# Patient Record
Sex: Male | Born: 1950 | Race: Black or African American | Hispanic: No | Smoking: Current every day smoker
Health system: Southern US, Community
[De-identification: ages and names within clinical notes are randomized; demographics above are authoritative.]

## PROBLEM LIST (undated history)

## (undated) DIAGNOSIS — M1990 Unspecified osteoarthritis, unspecified site: Secondary | ICD-10-CM

## (undated) DIAGNOSIS — G4730 Sleep apnea, unspecified: Secondary | ICD-10-CM

## (undated) DIAGNOSIS — C189 Malignant neoplasm of colon, unspecified: Secondary | ICD-10-CM

## (undated) DIAGNOSIS — K219 Gastro-esophageal reflux disease without esophagitis: Secondary | ICD-10-CM

## (undated) DIAGNOSIS — K635 Polyp of colon: Secondary | ICD-10-CM

## (undated) DIAGNOSIS — E119 Type 2 diabetes mellitus without complications: Secondary | ICD-10-CM

## (undated) DIAGNOSIS — Z9889 Other specified postprocedural states: Secondary | ICD-10-CM

## (undated) DIAGNOSIS — Z973 Presence of spectacles and contact lenses: Secondary | ICD-10-CM

## (undated) DIAGNOSIS — I10 Essential (primary) hypertension: Secondary | ICD-10-CM

## (undated) DIAGNOSIS — R011 Cardiac murmur, unspecified: Secondary | ICD-10-CM

## (undated) DIAGNOSIS — E785 Hyperlipidemia, unspecified: Secondary | ICD-10-CM

## (undated) DIAGNOSIS — N189 Chronic kidney disease, unspecified: Secondary | ICD-10-CM

## (undated) DIAGNOSIS — D649 Anemia, unspecified: Secondary | ICD-10-CM

## (undated) HISTORY — PX: COLOSTOMY REVERSAL: SHX5782

## (undated) HISTORY — DX: Hyperlipidemia, unspecified: E78.5

## (undated) HISTORY — PX: GANGLION CYST EXCISION: SHX1691

## (undated) HISTORY — DX: Cardiac murmur, unspecified: R01.1

## (undated) HISTORY — PX: COLONOSCOPY: SHX174

## (undated) HISTORY — DX: Malignant neoplasm of colon, unspecified: C18.9

## (undated) HISTORY — DX: Gastro-esophageal reflux disease without esophagitis: K21.9

## (undated) HISTORY — DX: Other specified postprocedural states: Z98.890

## (undated) HISTORY — DX: Sleep apnea, unspecified: G47.30

## (undated) HISTORY — PX: OTHER SURGICAL HISTORY: SHX169

## (undated) HISTORY — PX: RESECTION SMALL BOWEL / CLOSURE ILEOSTOMY: SUR1248

## (undated) HISTORY — DX: Anemia, unspecified: D64.9

## (undated) HISTORY — DX: Essential (primary) hypertension: I10

---

## 1998-10-05 ENCOUNTER — Ambulatory Visit (HOSPITAL_COMMUNITY): Admission: RE | Admit: 1998-10-05 | Discharge: 1998-10-05 | Payer: Self-pay | Admitting: *Deleted

## 2001-03-27 ENCOUNTER — Ambulatory Visit (HOSPITAL_BASED_OUTPATIENT_CLINIC_OR_DEPARTMENT_OTHER): Admission: RE | Admit: 2001-03-27 | Discharge: 2001-03-27 | Payer: Self-pay | Admitting: Otolaryngology

## 2002-04-06 ENCOUNTER — Encounter: Admission: RE | Admit: 2002-04-06 | Discharge: 2002-04-06 | Payer: Self-pay | Admitting: Internal Medicine

## 2002-05-14 ENCOUNTER — Encounter: Admission: RE | Admit: 2002-05-14 | Discharge: 2002-05-14 | Payer: Self-pay | Admitting: Internal Medicine

## 2002-08-23 ENCOUNTER — Encounter: Admission: RE | Admit: 2002-08-23 | Discharge: 2002-08-23 | Payer: Self-pay | Admitting: Internal Medicine

## 2002-10-17 ENCOUNTER — Encounter (INDEPENDENT_AMBULATORY_CARE_PROVIDER_SITE_OTHER): Payer: Self-pay | Admitting: *Deleted

## 2002-10-27 ENCOUNTER — Encounter: Admission: RE | Admit: 2002-10-27 | Discharge: 2002-10-27 | Payer: Self-pay | Admitting: Internal Medicine

## 2002-11-17 ENCOUNTER — Encounter: Admission: RE | Admit: 2002-11-17 | Discharge: 2002-11-17 | Payer: Self-pay | Admitting: Internal Medicine

## 2002-12-29 ENCOUNTER — Encounter: Admission: RE | Admit: 2002-12-29 | Discharge: 2002-12-29 | Payer: Self-pay | Admitting: Internal Medicine

## 2003-05-09 ENCOUNTER — Encounter: Admission: RE | Admit: 2003-05-09 | Discharge: 2003-05-09 | Payer: Self-pay | Admitting: Internal Medicine

## 2003-12-23 ENCOUNTER — Encounter: Admission: RE | Admit: 2003-12-23 | Discharge: 2003-12-23 | Payer: Self-pay | Admitting: Internal Medicine

## 2004-01-23 ENCOUNTER — Encounter: Admission: RE | Admit: 2004-01-23 | Discharge: 2004-01-23 | Payer: Self-pay | Admitting: Internal Medicine

## 2004-05-22 ENCOUNTER — Ambulatory Visit: Payer: Self-pay | Admitting: Internal Medicine

## 2004-06-08 ENCOUNTER — Ambulatory Visit: Payer: Self-pay | Admitting: Internal Medicine

## 2004-06-08 ENCOUNTER — Encounter (INDEPENDENT_AMBULATORY_CARE_PROVIDER_SITE_OTHER): Payer: Self-pay | Admitting: *Deleted

## 2004-09-21 ENCOUNTER — Inpatient Hospital Stay (HOSPITAL_COMMUNITY): Admission: EM | Admit: 2004-09-21 | Discharge: 2004-09-22 | Payer: Self-pay | Admitting: Emergency Medicine

## 2004-09-21 ENCOUNTER — Ambulatory Visit: Payer: Self-pay | Admitting: Internal Medicine

## 2004-09-21 IMAGING — CR DG CHEST 1V PORT
1 series · 1 of 1 positions shown · non-contrast
Comparison: none

CLINICAL DATA: 53 year old male; chest pain and pressure, shortness of breath.
 PORTABLE SINGLE VIEW CHEST RADIOGRAPH ? [DATE]: 
 No comparisons.

[view not recorded]
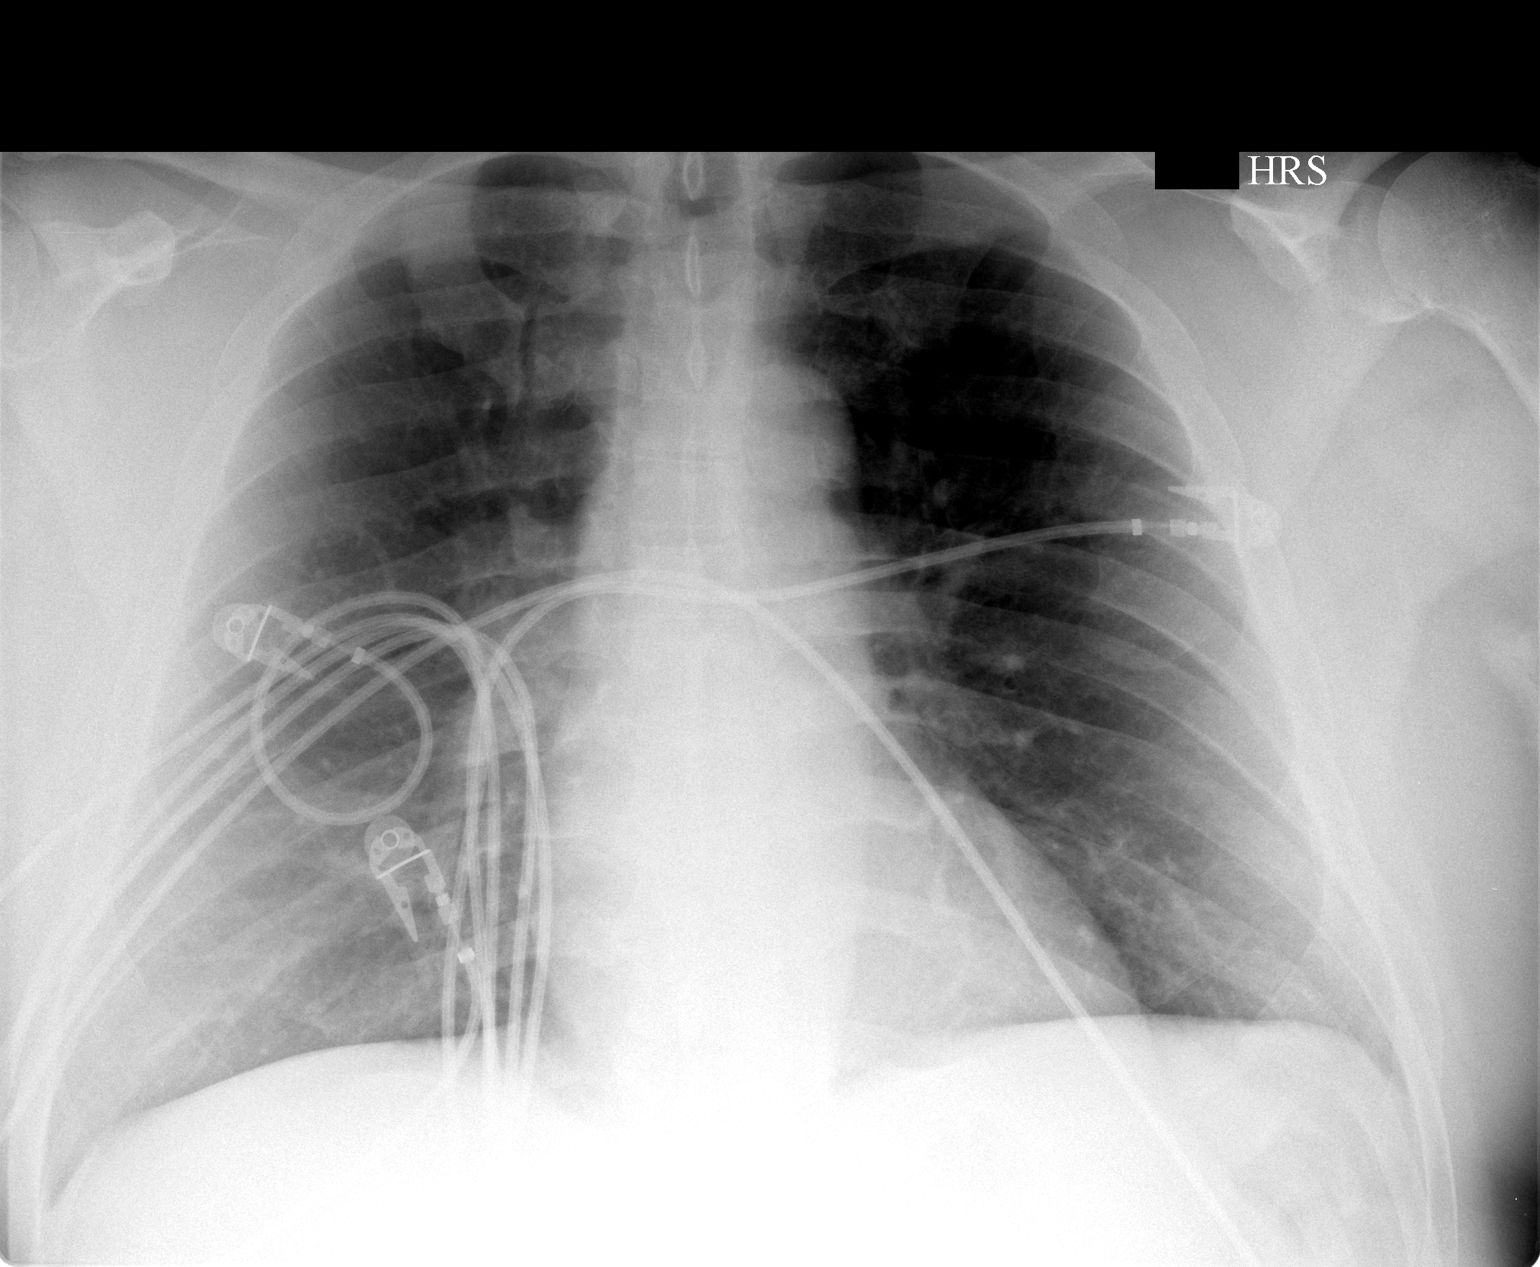

[1 of 1 positions shown; findings below may reference images not displayed]

FINDINGS: Monitor leads overlie the chest.  Normal heart size.  No congestive heart failure, pneumonia, effusion, or pneumothorax.
IMPRESSION: No acute finding.

## 2004-09-21 IMAGING — CR DG CHEST 2V
2 series · 2 of 2 positions shown · non-contrast
Comparison: none

CLINICAL DATA: Chest pain. 
 DIAGNOSTIC CHEST ? 2 VIEWS ? [DATE]:

[view not recorded (1 of 2)]
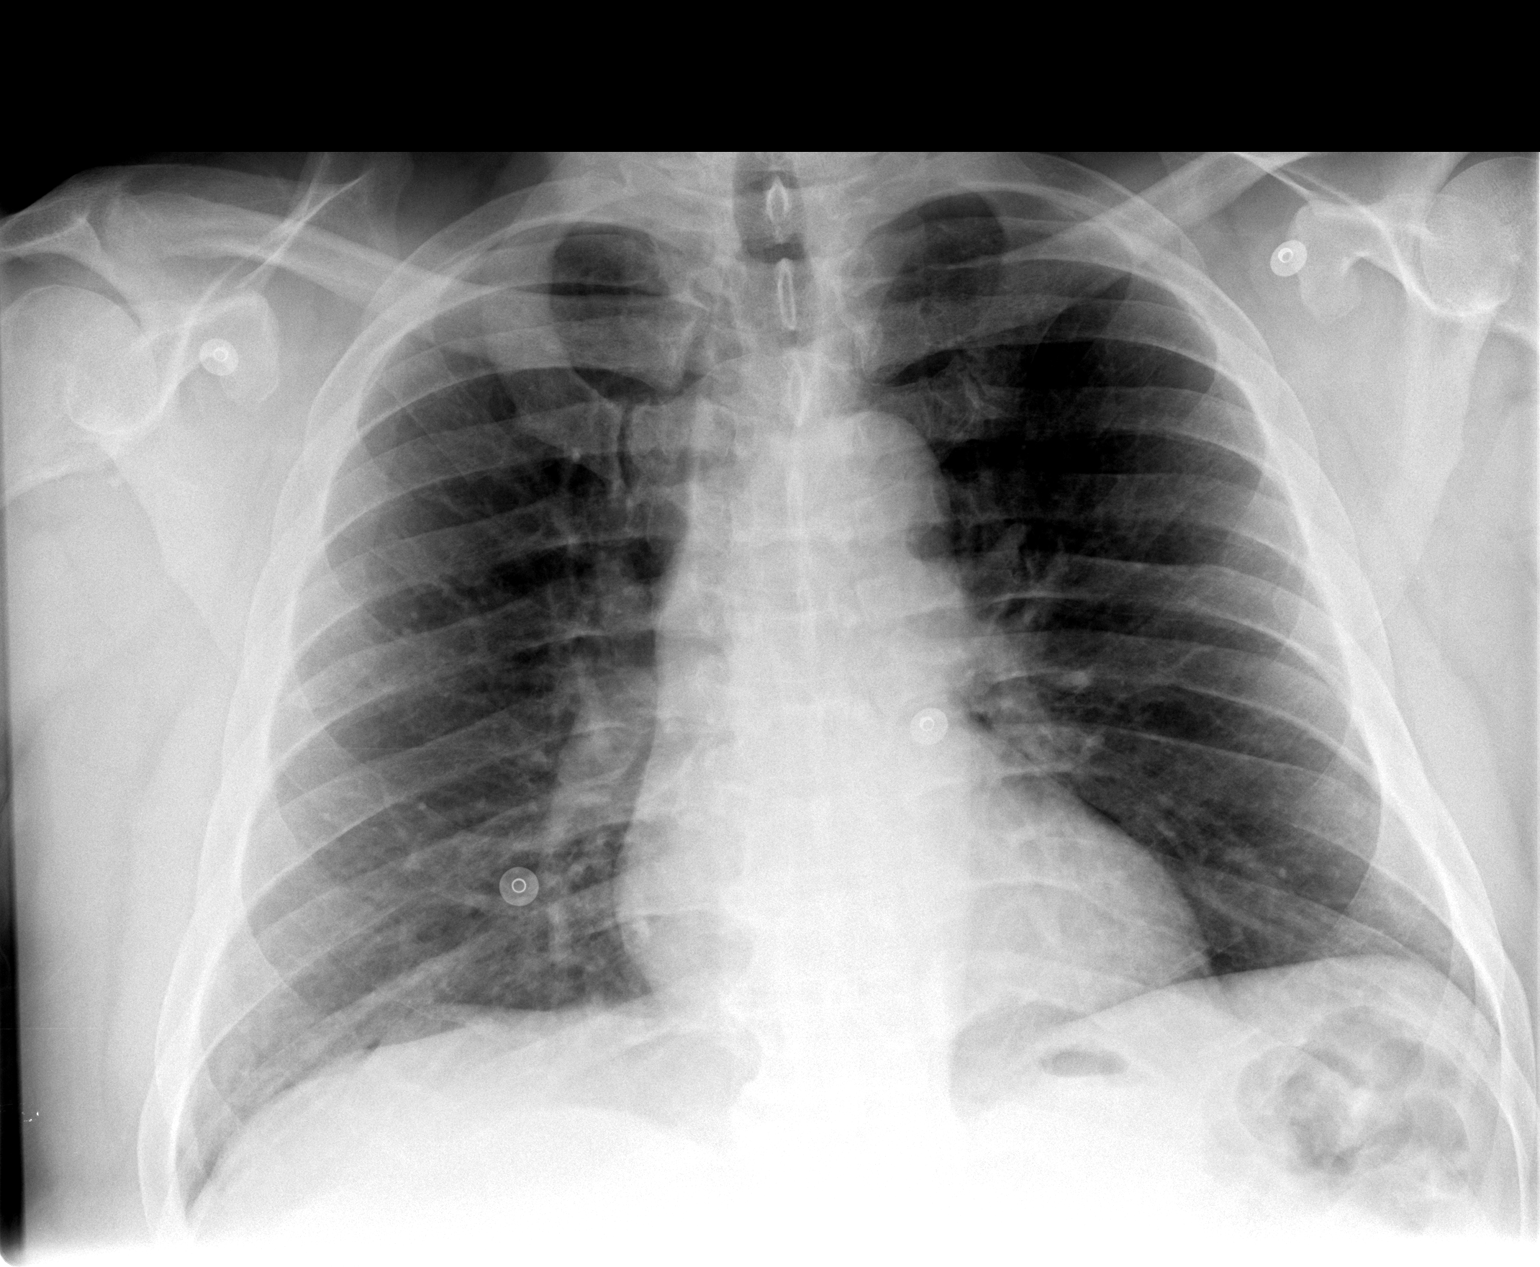

[view not recorded (2 of 2)]
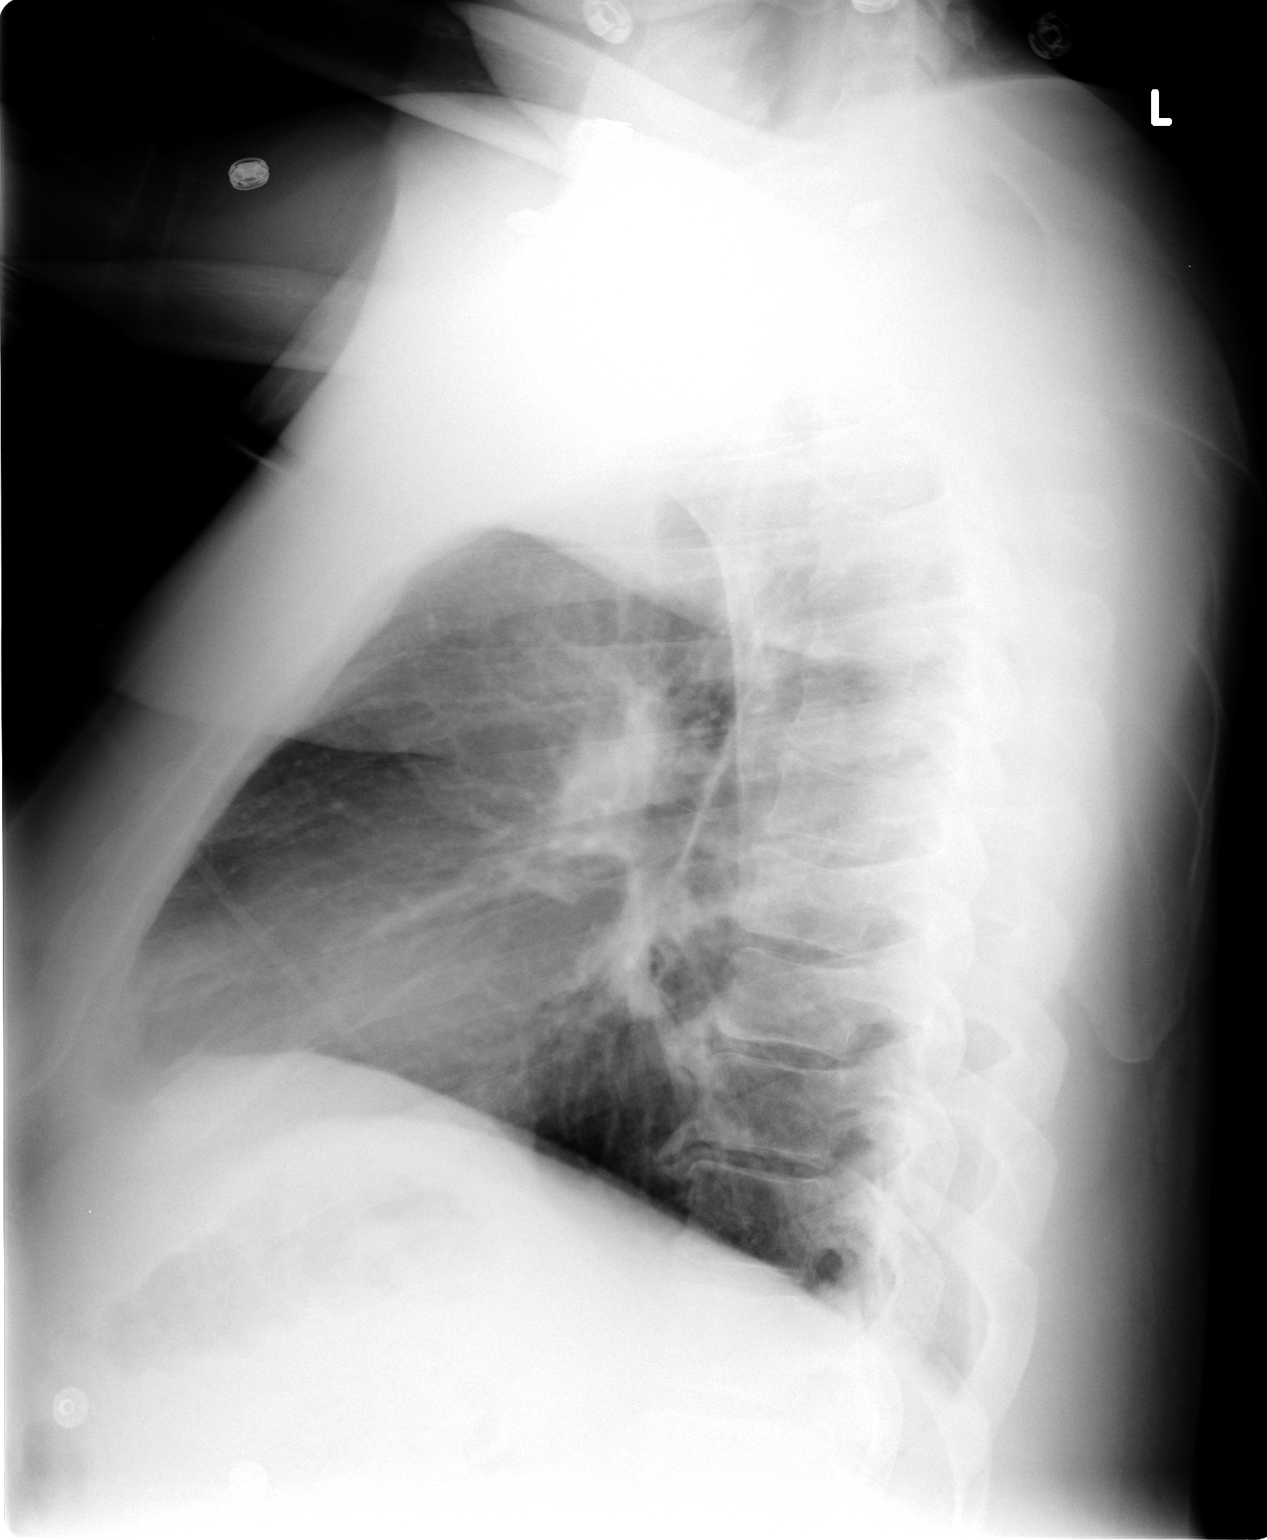

[2 of 2 positions shown; findings below may reference images not displayed]

FINDINGS: The heart size and mediastinal contours are normal.  The lungs are clear.  The visualized skeleton is unremarkable.   There is no interval change since [HOSPITAL] portable chest x-ray of [DATE].
IMPRESSION: No active disease.

## 2005-07-01 ENCOUNTER — Ambulatory Visit: Payer: Self-pay | Admitting: Internal Medicine

## 2005-07-31 ENCOUNTER — Emergency Department (HOSPITAL_COMMUNITY): Admission: EM | Admit: 2005-07-31 | Discharge: 2005-07-31 | Payer: Self-pay | Admitting: Family Medicine

## 2005-08-23 ENCOUNTER — Encounter (INDEPENDENT_AMBULATORY_CARE_PROVIDER_SITE_OTHER): Payer: Self-pay | Admitting: *Deleted

## 2005-08-23 ENCOUNTER — Ambulatory Visit: Payer: Self-pay | Admitting: Internal Medicine

## 2005-09-10 ENCOUNTER — Ambulatory Visit: Payer: Self-pay | Admitting: Hospitalist

## 2006-01-03 ENCOUNTER — Ambulatory Visit (HOSPITAL_COMMUNITY): Admission: RE | Admit: 2006-01-03 | Discharge: 2006-01-03 | Payer: Self-pay | Admitting: Internal Medicine

## 2006-01-03 ENCOUNTER — Ambulatory Visit: Payer: Self-pay | Admitting: Internal Medicine

## 2006-04-14 DIAGNOSIS — D539 Nutritional anemia, unspecified: Secondary | ICD-10-CM | POA: Insufficient documentation

## 2006-04-14 DIAGNOSIS — I10 Essential (primary) hypertension: Secondary | ICD-10-CM | POA: Insufficient documentation

## 2006-04-14 DIAGNOSIS — E785 Hyperlipidemia, unspecified: Secondary | ICD-10-CM | POA: Insufficient documentation

## 2006-04-14 DIAGNOSIS — E119 Type 2 diabetes mellitus without complications: Secondary | ICD-10-CM | POA: Insufficient documentation

## 2006-04-14 DIAGNOSIS — F17200 Nicotine dependence, unspecified, uncomplicated: Secondary | ICD-10-CM | POA: Insufficient documentation

## 2006-04-14 DIAGNOSIS — G4733 Obstructive sleep apnea (adult) (pediatric): Secondary | ICD-10-CM | POA: Insufficient documentation

## 2006-06-30 ENCOUNTER — Telehealth: Payer: Self-pay | Admitting: *Deleted

## 2007-04-14 ENCOUNTER — Ambulatory Visit: Payer: Self-pay | Admitting: Internal Medicine

## 2007-04-14 ENCOUNTER — Encounter (INDEPENDENT_AMBULATORY_CARE_PROVIDER_SITE_OTHER): Payer: Self-pay | Admitting: *Deleted

## 2007-04-14 LAB — CONVERTED CEMR LAB
ALT: 28 units/L (ref 0–53)
AST: 37 units/L (ref 0–37)
Albumin: 4.2 g/dL (ref 3.5–5.2)
Alkaline Phosphatase: 52 units/L (ref 39–117)
BUN: 13 mg/dL (ref 6–23)
Basophils Absolute: 0 10*3/uL (ref 0.0–0.1)
Basophils Relative: 0 % (ref 0–1)
Bilirubin Urine: NEGATIVE
CO2: 25 meq/L (ref 19–32)
Calcium: 9.5 mg/dL (ref 8.4–10.5)
Chloride: 102 meq/L (ref 96–112)
Cholesterol: 153 mg/dL (ref 0–200)
Creatinine, Ser: 1.15 mg/dL (ref 0.40–1.50)
Creatinine, Urine: 240.9 mg/dL
Eosinophils Absolute: 0.2 10*3/uL (ref 0.2–0.7)
Eosinophils Relative: 3 % (ref 0–5)
Ferritin: 55 ng/mL (ref 22–322)
Glucose, Bld: 283 mg/dL — ABNORMAL HIGH (ref 70–99)
HCT: 43.3 % (ref 39.0–52.0)
HDL: 44 mg/dL (ref >39)
Hemoglobin, Urine: NEGATIVE
Hemoglobin: 15.2 g/dL (ref 13.0–17.0)
Ketones, ur: NEGATIVE mg/dL
LDL Cholesterol: 85 mg/dL (ref 0–99)
Leukocytes, UA: NEGATIVE
Lymphocytes Relative: 46 % (ref 12–46)
Lymphs Abs: 3.4 10*3/uL (ref 0.7–4.0)
MCHC: 35.1 g/dL (ref 30.0–36.0)
MCV: 72 fL — ABNORMAL LOW (ref 78.0–100.0)
Microalb Creat Ratio: 15.9 mg/g (ref 0.0–30.0)
Microalb, Ur: 3.84 mg/dL — ABNORMAL HIGH (ref 0.00–1.89)
Monocytes Absolute: 0.5 10*3/uL (ref 0.1–1.0)
Monocytes Relative: 6 % (ref 3–12)
Neutro Abs: 3.4 10*3/uL (ref 1.7–7.7)
Neutrophils Relative %: 46 % (ref 43–77)
Nitrite: NEGATIVE
Platelets: 269 10*3/uL (ref 150–400)
Potassium: 5 meq/L (ref 3.5–5.3)
Protein, ur: NEGATIVE mg/dL
RBC: 6.01 M/uL — ABNORMAL HIGH (ref 4.22–5.81)
RDW: 15.7 % — ABNORMAL HIGH (ref 11.5–15.5)
Sodium: 138 meq/L (ref 135–145)
Specific Gravity, Urine: 1.028 (ref 1.005–1.03)
TSH: 2.186 microintl units/mL (ref 0.350–5.50)
Total Bilirubin: 0.7 mg/dL (ref 0.3–1.2)
Total CHOL/HDL Ratio: 3.5
Total Protein: 7.5 g/dL (ref 6.0–8.3)
Triglycerides: 120 mg/dL (ref <150)
Urine Glucose: 100 mg/dL — AB
Urobilinogen, UA: 0.2 (ref 0.0–1.0)
VLDL: 24 mg/dL (ref 0–40)
WBC: 7.5 10*3/uL (ref 4.0–10.5)
pH: 6 (ref 5.0–8.0)

## 2007-04-22 ENCOUNTER — Telehealth (INDEPENDENT_AMBULATORY_CARE_PROVIDER_SITE_OTHER): Payer: Self-pay | Admitting: *Deleted

## 2007-05-06 ENCOUNTER — Telehealth (INDEPENDENT_AMBULATORY_CARE_PROVIDER_SITE_OTHER): Payer: Self-pay | Admitting: *Deleted

## 2007-05-06 ENCOUNTER — Telehealth: Payer: Self-pay | Admitting: *Deleted

## 2007-05-12 ENCOUNTER — Ambulatory Visit: Payer: Self-pay | Admitting: Gastroenterology

## 2007-05-12 ENCOUNTER — Encounter (INDEPENDENT_AMBULATORY_CARE_PROVIDER_SITE_OTHER): Payer: Self-pay | Admitting: *Deleted

## 2007-06-09 ENCOUNTER — Encounter: Payer: Self-pay | Admitting: Gastroenterology

## 2007-06-09 ENCOUNTER — Ambulatory Visit: Payer: Self-pay | Admitting: Gastroenterology

## 2007-06-12 ENCOUNTER — Encounter: Payer: Self-pay | Admitting: Gastroenterology

## 2007-06-12 ENCOUNTER — Ambulatory Visit: Payer: Self-pay | Admitting: Gastroenterology

## 2007-06-12 ENCOUNTER — Encounter (INDEPENDENT_AMBULATORY_CARE_PROVIDER_SITE_OTHER): Payer: Self-pay | Admitting: *Deleted

## 2007-06-16 ENCOUNTER — Ambulatory Visit: Payer: Self-pay | Admitting: Infectious Disease

## 2007-06-16 ENCOUNTER — Encounter (INDEPENDENT_AMBULATORY_CARE_PROVIDER_SITE_OTHER): Payer: Self-pay | Admitting: *Deleted

## 2007-06-16 LAB — CONVERTED CEMR LAB
ALT: 25 units/L (ref 0–53)
AST: 23 units/L (ref 0–37)
Albumin: 4.3 g/dL (ref 3.5–5.2)
Alkaline Phosphatase: 58 units/L (ref 39–117)
BUN: 11 mg/dL (ref 6–23)
CO2: 25 meq/L (ref 19–32)
Calcium: 9.8 mg/dL (ref 8.4–10.5)
Chloride: 103 meq/L (ref 96–112)
Creatinine, Ser: 1.14 mg/dL (ref 0.40–1.50)
Glucose, Bld: 330 mg/dL — ABNORMAL HIGH (ref 70–99)
HCT: 42.4 % (ref 39.0–52.0)
Hemoglobin: 15.2 g/dL (ref 13.0–17.0)
Hgb A1c MFr Bld: 13.7 %
MCHC: 35.8 g/dL (ref 30.0–36.0)
MCV: 71.1 fL — ABNORMAL LOW (ref 78.0–100.0)
Platelets: 286 10*3/uL (ref 150–400)
Potassium: 4.6 meq/L (ref 3.5–5.3)
RBC: 5.96 M/uL — ABNORMAL HIGH (ref 4.22–5.81)
RDW: 15.2 % (ref 11.5–15.5)
Sodium: 138 meq/L (ref 135–145)
Total Bilirubin: 0.9 mg/dL (ref 0.3–1.2)
Total Protein: 7.5 g/dL (ref 6.0–8.3)
WBC: 9.7 10*3/uL (ref 4.0–10.5)

## 2007-07-17 ENCOUNTER — Telehealth (INDEPENDENT_AMBULATORY_CARE_PROVIDER_SITE_OTHER): Payer: Self-pay | Admitting: *Deleted

## 2007-07-17 ENCOUNTER — Ambulatory Visit (HOSPITAL_BASED_OUTPATIENT_CLINIC_OR_DEPARTMENT_OTHER): Admission: RE | Admit: 2007-07-17 | Discharge: 2007-07-17 | Payer: Self-pay | Admitting: *Deleted

## 2007-07-25 ENCOUNTER — Ambulatory Visit: Payer: Self-pay | Admitting: Internal Medicine

## 2007-08-12 ENCOUNTER — Telehealth (INDEPENDENT_AMBULATORY_CARE_PROVIDER_SITE_OTHER): Payer: Self-pay | Admitting: *Deleted

## 2007-11-14 ENCOUNTER — Emergency Department (HOSPITAL_COMMUNITY): Admission: EM | Admit: 2007-11-14 | Discharge: 2007-11-14 | Payer: Self-pay | Admitting: Emergency Medicine

## 2007-11-14 IMAGING — CR DG HIP (WITH OR WITHOUT PELVIS) 2-3V*L*
2 series · 2 of 2 positions shown · non-contrast
Comparison: None

CLINICAL DATA: Fall

LEFT HIP - COMPLETE 2+ VIEW

[t hip ap left]
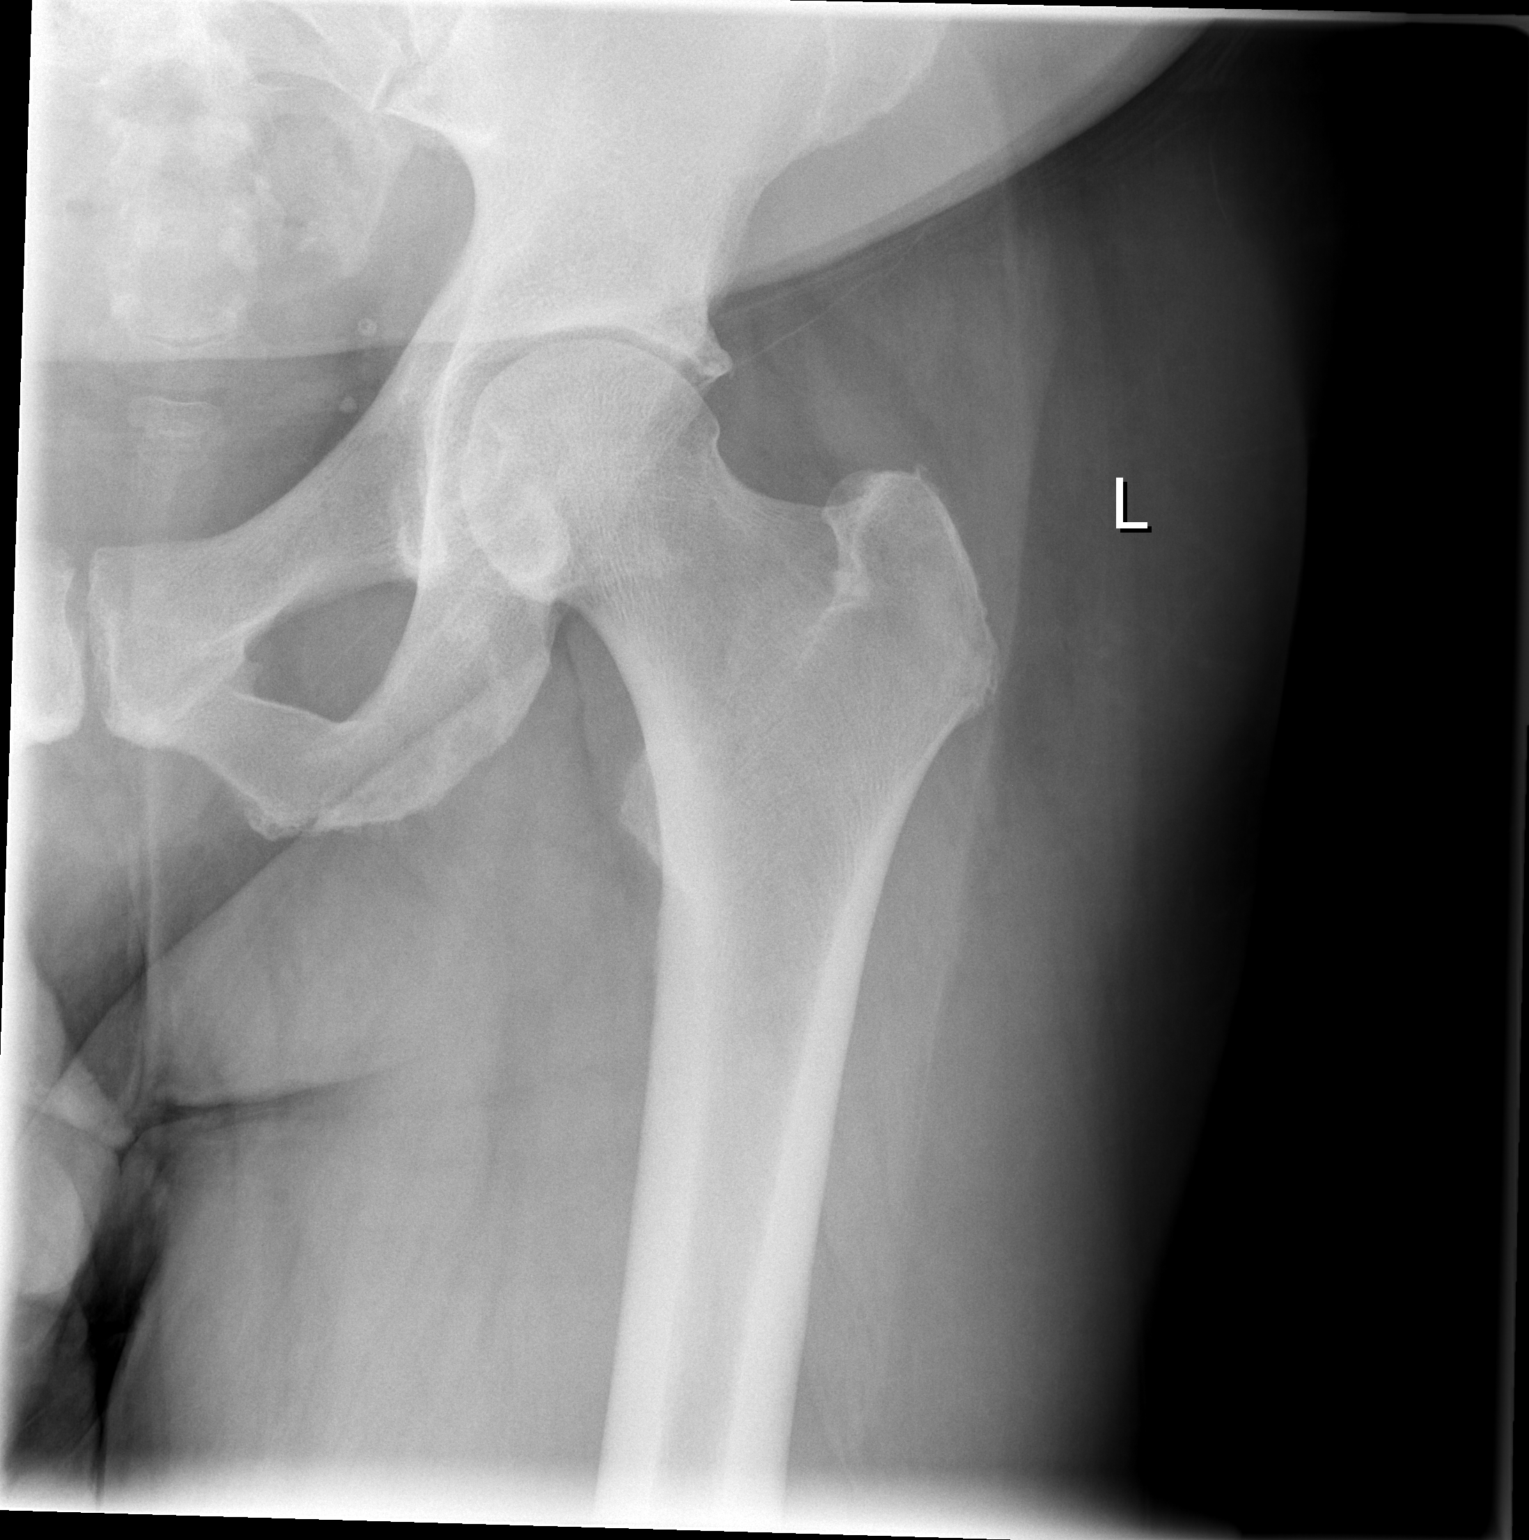

[t hip frog leg left]
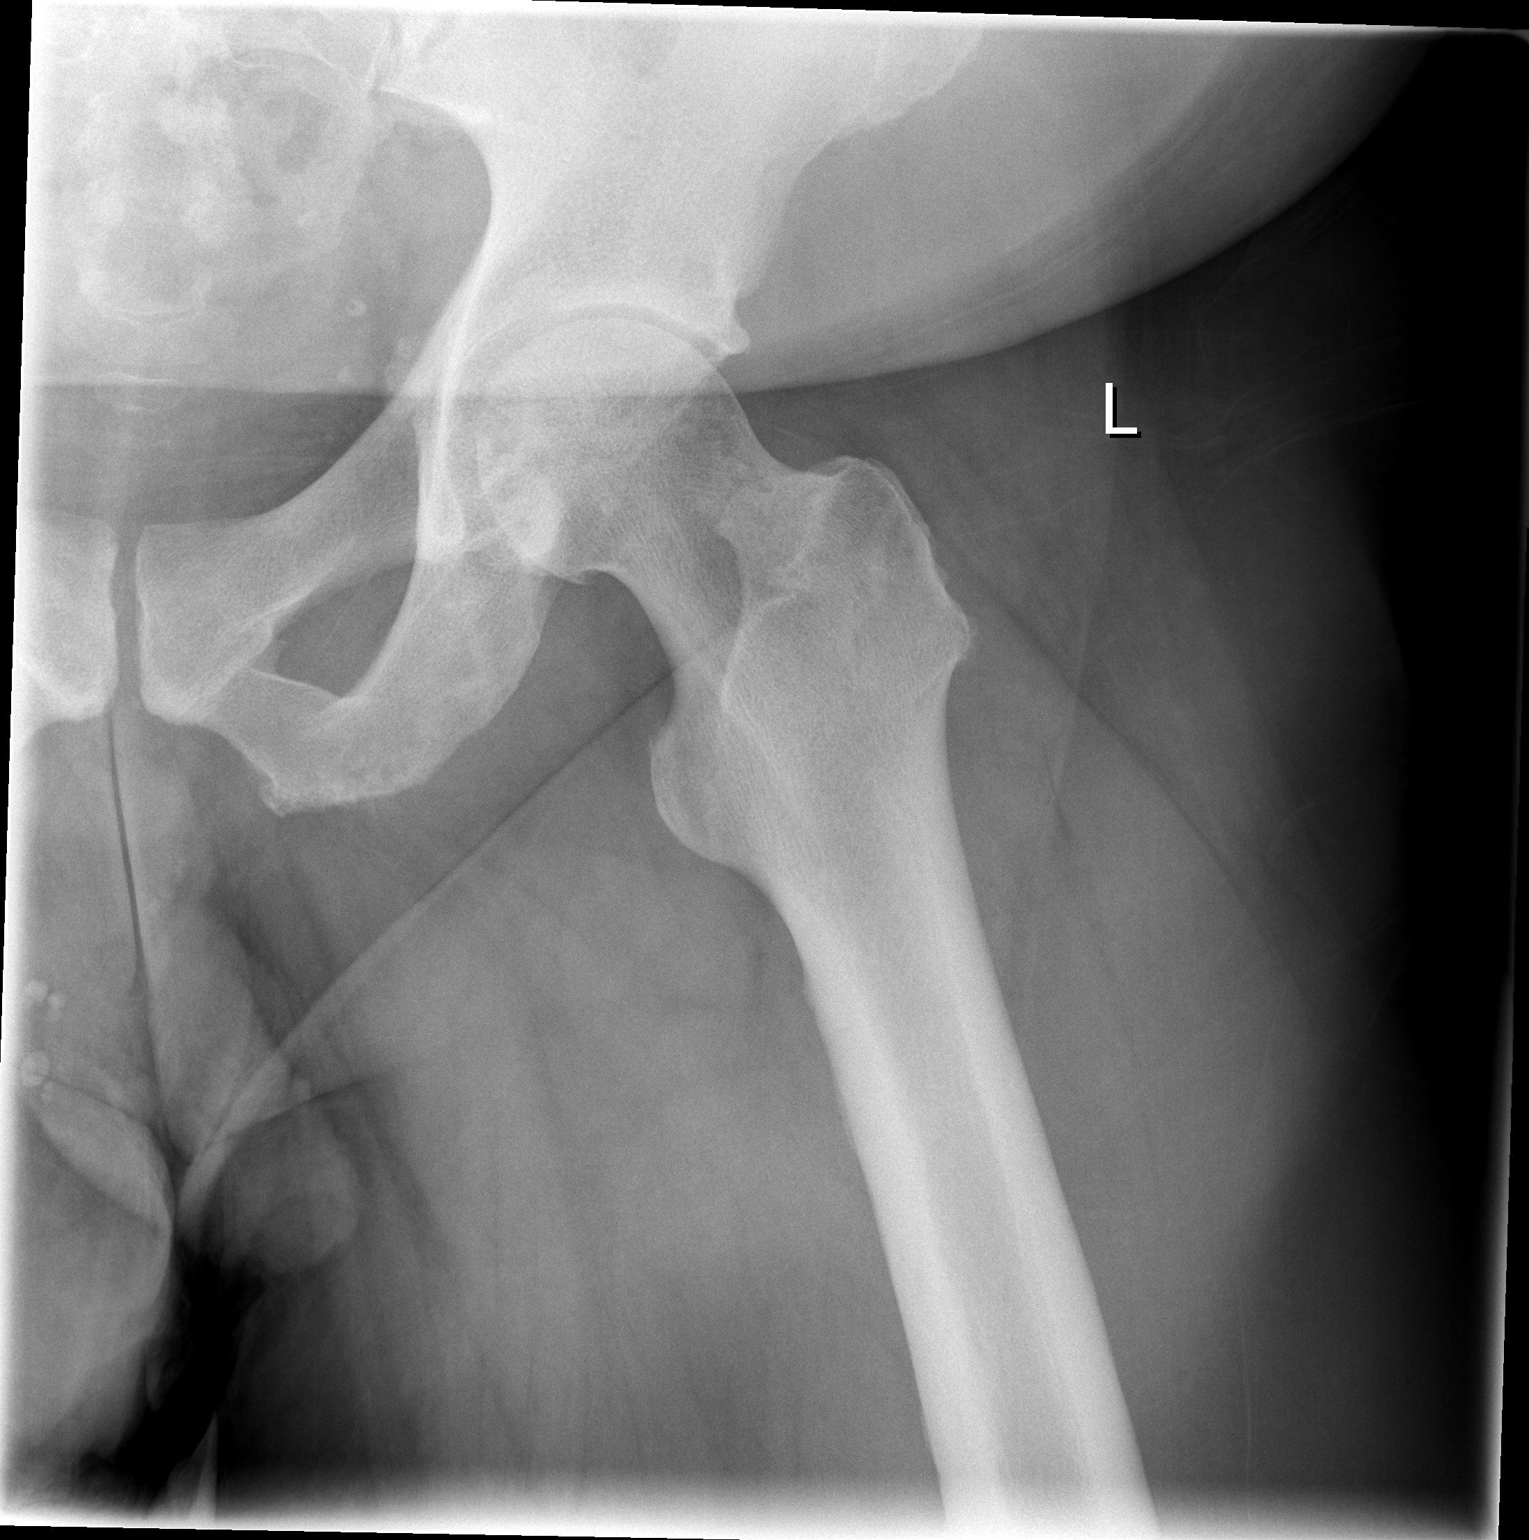

[2 of 2 positions shown; findings below may reference images not displayed]

FINDINGS: No acute fracture or dislocation is seen.  Degenerative
changes are noted in the hip joint and knee.  Soft tissues are
within normal limits.
IMPRESSION: No acute bony pathology.

## 2007-11-14 IMAGING — CR DG PELVIS 1-2V
1 series · 1 of 1 positions shown · non-contrast
Comparison: None

CLINICAL DATA: Fall

PELVIS - 1-2 VIEW

[t pelvis a.p.]
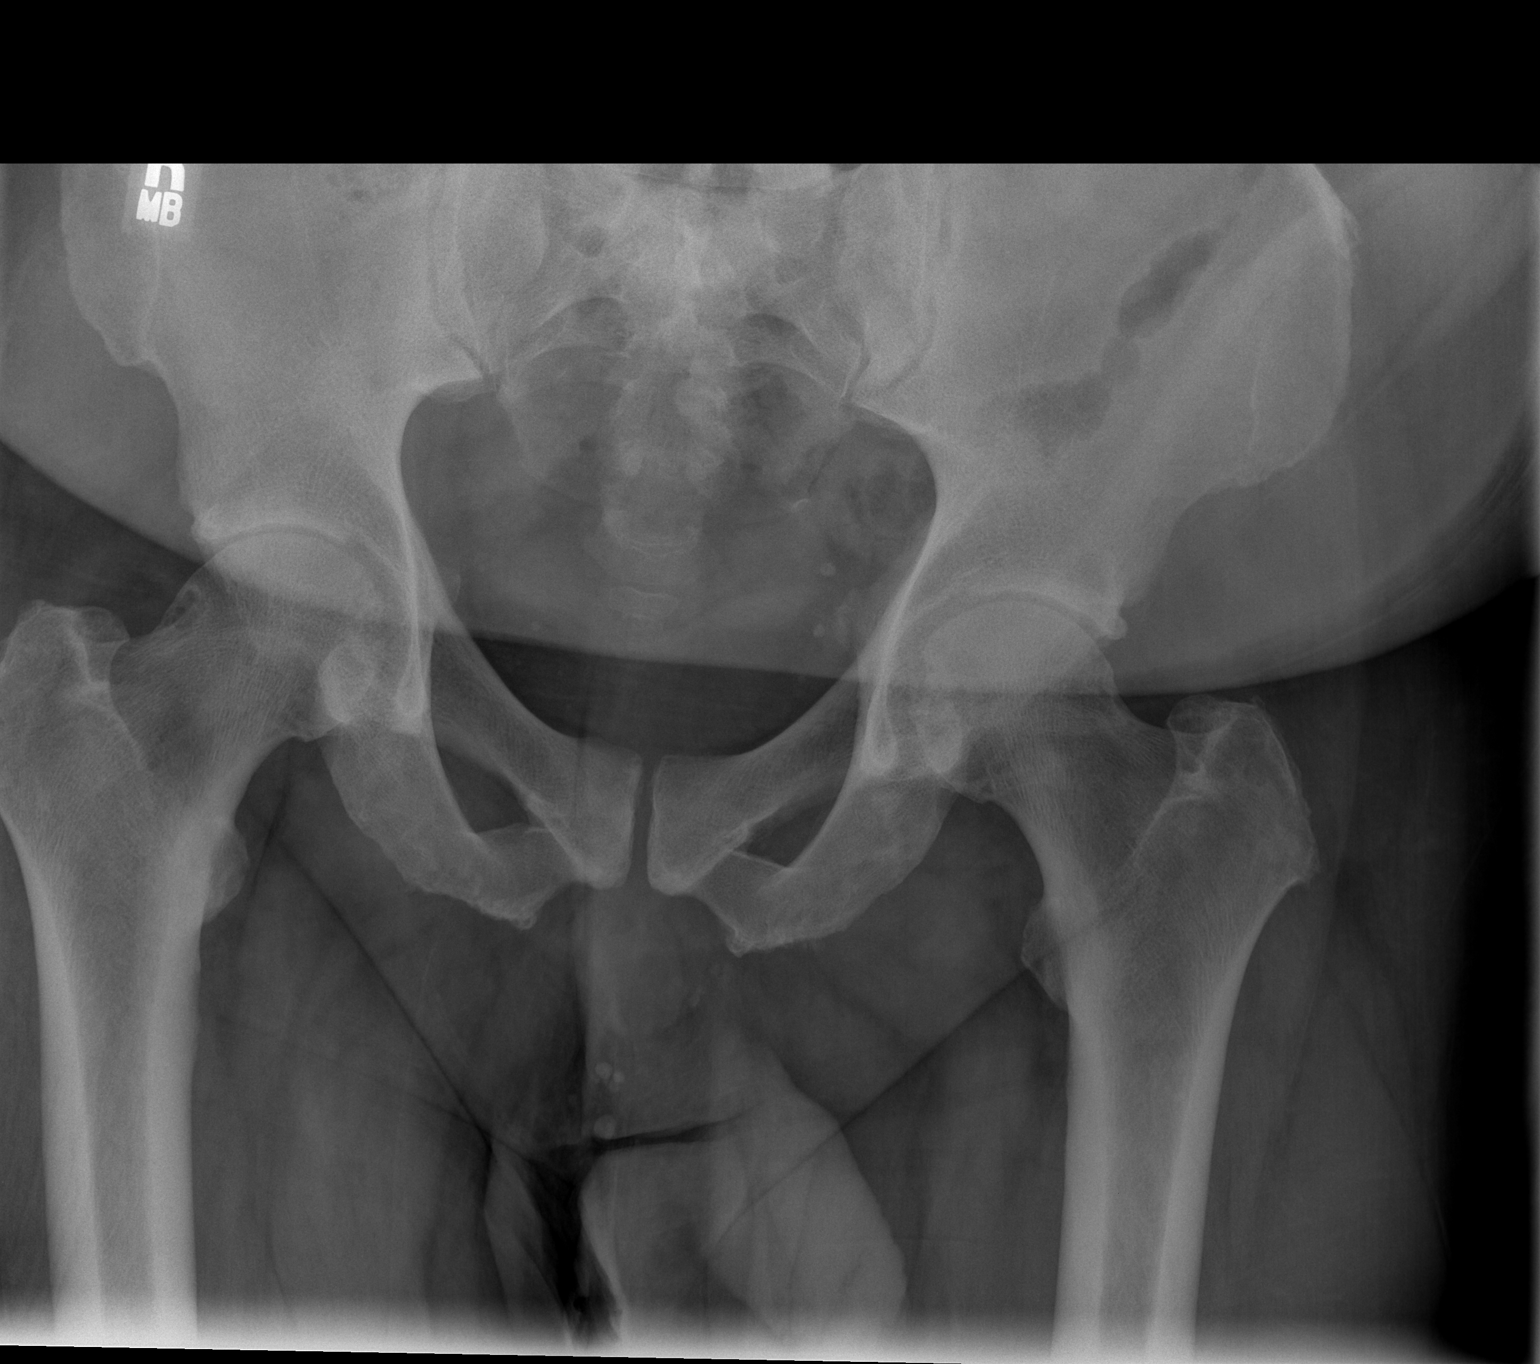

[1 of 1 positions shown; findings below may reference images not displayed]

FINDINGS: No acute fracture or dislocation is seen.  Soft tissues
are within normal limits.  Degenerative changes are noted.
IMPRESSION: No acute bony injury.

## 2007-11-14 IMAGING — CR DG FEMUR 2V*L*
2 series · 2 of 2 positions shown · non-contrast
Comparison: Left hip series from [DATE].

THE STUDY IDENTIFIED IS THE SAME REPORT ON [DATE] AT [D3]
HOURS.
CLINICAL DATA: 56-year-old male with left anterior thigh pain.
Status post fall.

LEFT FEMUR - 2 VIEW

[t hip ap left *]
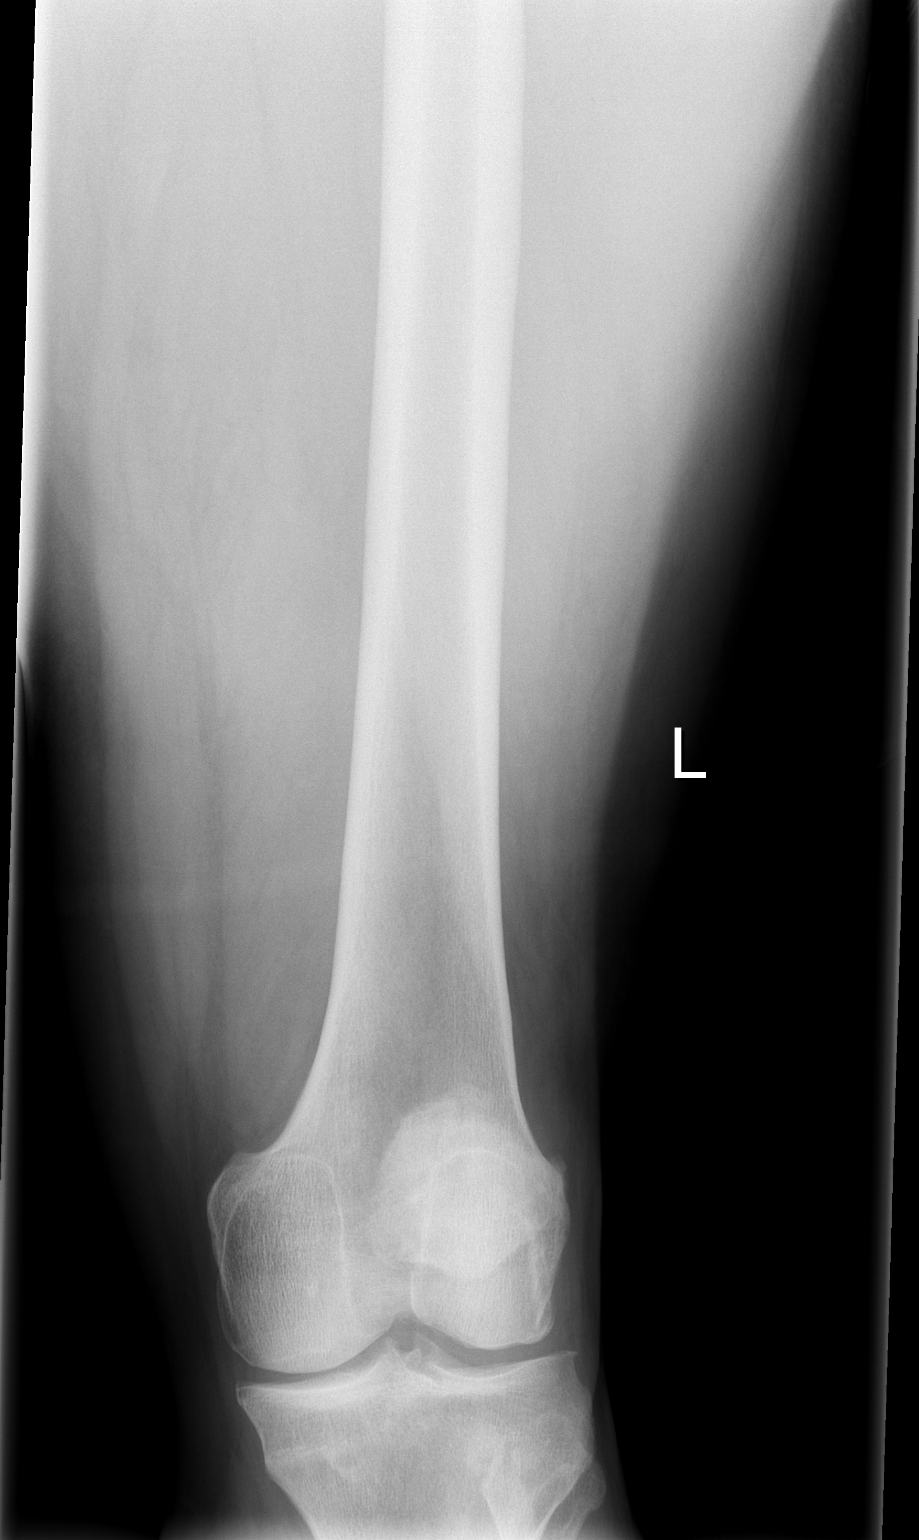

[t hip frog leg left *]
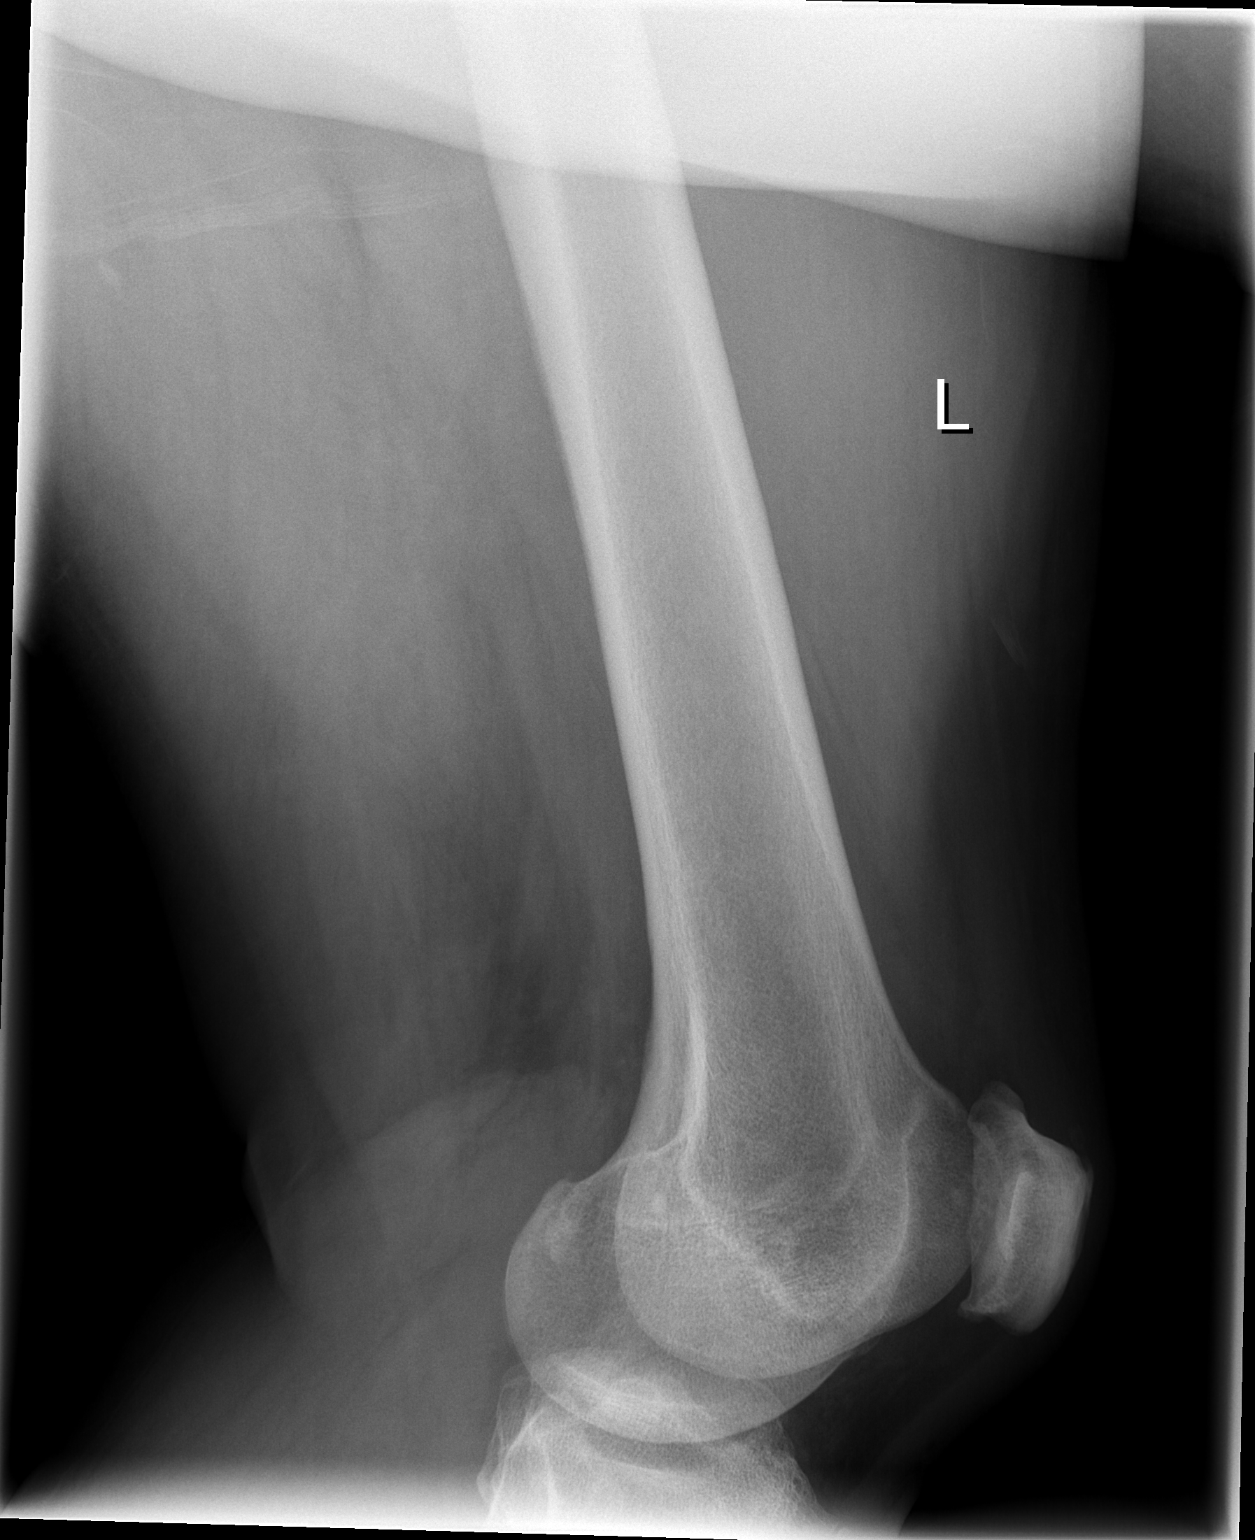

[2 of 2 positions shown; findings below may reference images not displayed]

FINDINGS: Bone mineralization of the distal left femur appears
within normal limits.  There is some spurring of the patella.
Patellofemoral joint space appears within normal limits.  There is
medial compartment joint space narrowing.  No fracture of the
distal left femur identified.  No focal soft tissue lesion evident.
IMPRESSION: No acute osseous abnormality seen in the distal left femur.
Patellofemoral and medial compartment left knee degenerative
changes.

## 2008-02-19 ENCOUNTER — Telehealth: Payer: Self-pay | Admitting: *Deleted

## 2008-03-11 ENCOUNTER — Telehealth (INDEPENDENT_AMBULATORY_CARE_PROVIDER_SITE_OTHER): Payer: Self-pay | Admitting: *Deleted

## 2008-04-04 ENCOUNTER — Encounter: Payer: Self-pay | Admitting: Internal Medicine

## 2008-04-08 ENCOUNTER — Telehealth: Payer: Self-pay | Admitting: Infectious Diseases

## 2008-04-12 ENCOUNTER — Ambulatory Visit: Payer: Self-pay | Admitting: Internal Medicine

## 2008-04-12 ENCOUNTER — Encounter: Payer: Self-pay | Admitting: Internal Medicine

## 2008-04-12 LAB — CONVERTED CEMR LAB
Blood Glucose, Fingerstick: 389
Cholesterol: 113 mg/dL (ref 0–200)
Creatinine, Urine: 152.1 mg/dL
HCT: 40.8 % (ref 39.0–52.0)
HDL: 45 mg/dL (ref >39)
Hemoglobin: 14.1 g/dL (ref 13.0–17.0)
Hgb A1c MFr Bld: 11.4 %
LDL Cholesterol: 53 mg/dL (ref 0–99)
MCHC: 34.6 g/dL (ref 30.0–36.0)
MCV: 72.2 fL — ABNORMAL LOW (ref 78.0–100.0)
Microalb Creat Ratio: 24.9 mg/g (ref 0.0–30.0)
Microalb, Ur: 3.79 mg/dL — ABNORMAL HIGH (ref 0.00–1.89)
Platelets: 263 10*3/uL (ref 150–400)
RBC: 5.65 M/uL (ref 4.22–5.81)
RDW: 15.2 % (ref 11.5–15.5)
Total CHOL/HDL Ratio: 2.5
Triglycerides: 77 mg/dL (ref <150)
VLDL: 15 mg/dL (ref 0–40)
WBC: 7.4 10*3/uL (ref 4.0–10.5)

## 2008-04-12 LAB — HM DIABETES FOOT EXAM

## 2008-05-30 ENCOUNTER — Telehealth: Payer: Self-pay | Admitting: Infectious Diseases

## 2008-06-29 ENCOUNTER — Ambulatory Visit: Payer: Self-pay | Admitting: Internal Medicine

## 2008-06-29 LAB — CONVERTED CEMR LAB
Blood Glucose, Fingerstick: 265
Hgb A1c MFr Bld: 11.2 %

## 2008-07-06 ENCOUNTER — Telehealth: Payer: Self-pay | Admitting: Gastroenterology

## 2008-07-15 ENCOUNTER — Ambulatory Visit: Payer: Self-pay | Admitting: Internal Medicine

## 2008-07-15 ENCOUNTER — Encounter: Payer: Self-pay | Admitting: Internal Medicine

## 2008-07-15 LAB — CONVERTED CEMR LAB
ALT: 28 units/L (ref 0–53)
AST: 37 units/L (ref 0–37)
Albumin: 4.1 g/dL (ref 3.5–5.2)
Alkaline Phosphatase: 71 units/L (ref 39–117)
BUN: 19 mg/dL (ref 6–23)
CO2: 23 meq/L (ref 19–32)
Calcium: 9.7 mg/dL (ref 8.4–10.5)
Chloride: 105 meq/L (ref 96–112)
Creatinine, Ser: 1.14 mg/dL (ref 0.40–1.50)
Glucose, Bld: 193 mg/dL — ABNORMAL HIGH (ref 70–99)
Potassium: 5 meq/L (ref 3.5–5.3)
Sodium: 140 meq/L (ref 135–145)
Total Bilirubin: 0.6 mg/dL (ref 0.3–1.2)
Total Protein: 7.5 g/dL (ref 6.0–8.3)

## 2008-07-19 ENCOUNTER — Telehealth: Payer: Self-pay | Admitting: Internal Medicine

## 2008-08-04 ENCOUNTER — Ambulatory Visit: Payer: Self-pay | Admitting: Internal Medicine

## 2008-08-04 ENCOUNTER — Encounter (INDEPENDENT_AMBULATORY_CARE_PROVIDER_SITE_OTHER): Payer: Self-pay | Admitting: Internal Medicine

## 2008-08-09 ENCOUNTER — Telehealth: Payer: Self-pay | Admitting: Internal Medicine

## 2008-08-16 ENCOUNTER — Telehealth (INDEPENDENT_AMBULATORY_CARE_PROVIDER_SITE_OTHER): Payer: Self-pay | Admitting: *Deleted

## 2008-10-24 ENCOUNTER — Ambulatory Visit: Payer: Self-pay | Admitting: Internal Medicine

## 2009-05-05 ENCOUNTER — Telehealth: Payer: Self-pay | Admitting: Internal Medicine

## 2009-05-25 ENCOUNTER — Telehealth (INDEPENDENT_AMBULATORY_CARE_PROVIDER_SITE_OTHER): Payer: Self-pay | Admitting: *Deleted

## 2009-07-26 ENCOUNTER — Telehealth: Payer: Self-pay | Admitting: Internal Medicine

## 2009-08-15 ENCOUNTER — Telehealth: Payer: Self-pay | Admitting: Internal Medicine

## 2009-08-22 ENCOUNTER — Telehealth: Payer: Self-pay | Admitting: Internal Medicine

## 2009-09-05 ENCOUNTER — Ambulatory Visit: Payer: Self-pay | Admitting: Internal Medicine

## 2009-09-05 LAB — CONVERTED CEMR LAB
BUN: 12 mg/dL (ref 6–23)
Basophils Absolute: 0 10*3/uL (ref 0.0–0.1)
Basophils Relative: 0 % (ref 0–1)
Blood Glucose, Fingerstick: 50
CO2: 25 meq/L (ref 19–32)
Calcium: 8.7 mg/dL (ref 8.4–10.5)
Chloride: 106 meq/L (ref 96–112)
Creatinine, Ser: 1.15 mg/dL (ref 0.40–1.50)
Eosinophils Absolute: 0.1 10*3/uL (ref 0.0–0.7)
Eosinophils Relative: 1 % (ref 0–5)
Glucose, Bld: 99 mg/dL (ref 70–99)
HCT: 38.8 % — ABNORMAL LOW (ref 39.0–52.0)
Hemoglobin: 13.6 g/dL (ref 13.0–17.0)
Hgb A1c MFr Bld: 7 %
Lymphocytes Relative: 44 % (ref 12–46)
Lymphs Abs: 3.4 10*3/uL (ref 0.7–4.0)
MCHC: 35.1 g/dL (ref 30.0–36.0)
MCV: 69.4 fL — ABNORMAL LOW (ref >=78.0)
Monocytes Absolute: 0.5 10*3/uL (ref 0.1–1.0)
Monocytes Relative: 6 % (ref 3–12)
Neutro Abs: 3.8 10*3/uL (ref 1.7–7.7)
Neutrophils Relative %: 49 % (ref 43–77)
Platelets: 293 10*3/uL (ref 150–400)
Potassium: 4.4 meq/L (ref 3.5–5.3)
RBC: 5.59 M/uL (ref 4.22–5.81)
RDW: 16.1 % — ABNORMAL HIGH (ref 11.5–15.5)
Sodium: 139 meq/L (ref 135–145)
WBC: 7.8 10*3/uL (ref 4.0–10.5)

## 2009-09-25 ENCOUNTER — Ambulatory Visit: Payer: Self-pay | Admitting: Internal Medicine

## 2009-10-06 ENCOUNTER — Emergency Department (HOSPITAL_COMMUNITY): Admission: EM | Admit: 2009-10-06 | Discharge: 2009-10-06 | Payer: Self-pay | Admitting: Emergency Medicine

## 2009-11-21 ENCOUNTER — Telehealth: Payer: Self-pay | Admitting: Internal Medicine

## 2009-11-28 ENCOUNTER — Telehealth: Payer: Self-pay | Admitting: Internal Medicine

## 2009-12-05 ENCOUNTER — Telehealth: Payer: Self-pay | Admitting: *Deleted

## 2009-12-05 ENCOUNTER — Telehealth: Payer: Self-pay | Admitting: Internal Medicine

## 2009-12-05 ENCOUNTER — Ambulatory Visit: Payer: Self-pay | Admitting: Internal Medicine

## 2009-12-05 LAB — CONVERTED CEMR LAB
ALT: 29 units/L (ref 0–53)
AST: 35 units/L (ref 0–37)
Albumin: 3.2 g/dL — ABNORMAL LOW (ref 3.5–5.2)
Alkaline Phosphatase: 53 units/L (ref 39–117)
BUN: 12 mg/dL (ref 6–23)
Basophils Absolute: 0 10*3/uL (ref 0.0–0.1)
Basophils Relative: 0 % (ref 0–1)
Bilirubin Urine: NEGATIVE
CO2: 29 meq/L (ref 19–32)
Calcium: 9.1 mg/dL (ref 8.4–10.5)
Chloride: 108 meq/L (ref 96–112)
Creatinine, Ser: 1.09 mg/dL (ref 0.40–1.50)
Eosinophils Absolute: 0.2 10*3/uL (ref 0.0–0.7)
Eosinophils Relative: 3 % (ref 0–5)
Glucose, Bld: 132 mg/dL — ABNORMAL HIGH (ref 70–99)
HCT: 38.6 % — ABNORMAL LOW (ref 39.0–52.0)
Hemoglobin, Urine: NEGATIVE
Hemoglobin: 13.1 g/dL (ref 13.0–17.0)
Ketones, ur: NEGATIVE mg/dL
Leukocytes, UA: NEGATIVE
Lymphocytes Relative: 38 % (ref 12–46)
Lymphs Abs: 2.4 10*3/uL (ref 0.7–4.0)
MCHC: 34 g/dL (ref 30.0–36.0)
MCV: 74.8 fL — ABNORMAL LOW (ref >=78.0)
Monocytes Absolute: 0.5 10*3/uL (ref 0.1–1.0)
Monocytes Relative: 7 % (ref 3–12)
Neutro Abs: 3.4 10*3/uL (ref 1.7–7.7)
Neutrophils Relative %: 52 % (ref 43–77)
Nitrite: NEGATIVE
Platelets: 246 10*3/uL (ref 150–400)
Potassium: 4.8 meq/L (ref 3.5–5.3)
Protein, ur: NEGATIVE mg/dL
RBC: 5.16 M/uL (ref 4.22–5.81)
RDW: 17.3 % — ABNORMAL HIGH (ref 11.5–15.5)
Sodium: 141 meq/L (ref 135–145)
Specific Gravity, Urine: 1.028 (ref 1.005–1.0)
Total Bilirubin: 0.8 mg/dL (ref 0.3–1.2)
Total Protein: 6.3 g/dL (ref 6.0–8.3)
Urine Glucose: NEGATIVE mg/dL
Urobilinogen, UA: 2 — ABNORMAL HIGH (ref 0.0–1.0)
WBC: 6.5 10*3/uL (ref 4.0–10.5)
pH: 6 (ref 5.0–8.0)

## 2009-12-06 ENCOUNTER — Telehealth: Payer: Self-pay | Admitting: Internal Medicine

## 2009-12-07 ENCOUNTER — Telehealth: Payer: Self-pay | Admitting: Internal Medicine

## 2009-12-07 ENCOUNTER — Ambulatory Visit: Payer: Self-pay | Admitting: Internal Medicine

## 2009-12-07 LAB — CONVERTED CEMR LAB
Ferritin: 21 ng/mL — ABNORMAL LOW (ref 22–322)
Iron: 36 ug/dL — ABNORMAL LOW (ref 42–165)
Saturation Ratios: 11 % — ABNORMAL LOW (ref 20–55)
TIBC: 330 ug/dL (ref 215–435)
UIBC: 294 ug/dL

## 2009-12-20 ENCOUNTER — Encounter (INDEPENDENT_AMBULATORY_CARE_PROVIDER_SITE_OTHER): Payer: Self-pay | Admitting: *Deleted

## 2010-01-04 ENCOUNTER — Encounter (INDEPENDENT_AMBULATORY_CARE_PROVIDER_SITE_OTHER): Payer: Self-pay | Admitting: *Deleted

## 2010-01-09 ENCOUNTER — Ambulatory Visit: Payer: Self-pay | Admitting: Gastroenterology

## 2010-01-09 ENCOUNTER — Encounter (INDEPENDENT_AMBULATORY_CARE_PROVIDER_SITE_OTHER): Payer: Self-pay | Admitting: *Deleted

## 2010-01-10 ENCOUNTER — Telehealth (INDEPENDENT_AMBULATORY_CARE_PROVIDER_SITE_OTHER): Payer: Self-pay | Admitting: *Deleted

## 2010-01-15 ENCOUNTER — Encounter: Payer: Self-pay | Admitting: Internal Medicine

## 2010-01-15 ENCOUNTER — Ambulatory Visit (HOSPITAL_COMMUNITY): Admission: RE | Admit: 2010-01-15 | Discharge: 2010-01-15 | Payer: Self-pay | Admitting: Gastroenterology

## 2010-01-15 ENCOUNTER — Ambulatory Visit: Payer: Self-pay | Admitting: Gastroenterology

## 2010-01-17 ENCOUNTER — Encounter (INDEPENDENT_AMBULATORY_CARE_PROVIDER_SITE_OTHER): Payer: Self-pay | Admitting: *Deleted

## 2010-01-17 ENCOUNTER — Encounter: Payer: Self-pay | Admitting: Gastroenterology

## 2010-02-26 ENCOUNTER — Ambulatory Visit: Payer: Self-pay | Admitting: Gastroenterology

## 2010-03-22 ENCOUNTER — Telehealth: Payer: Self-pay | Admitting: Internal Medicine

## 2010-05-31 ENCOUNTER — Telehealth: Payer: Self-pay | Admitting: Internal Medicine

## 2010-06-08 ENCOUNTER — Ambulatory Visit: Admission: RE | Admit: 2010-06-08 | Discharge: 2010-06-08 | Payer: Self-pay | Source: Home / Self Care

## 2010-06-08 LAB — GLUCOSE, CAPILLARY: Glucose-Capillary: 61 mg/dL — ABNORMAL LOW (ref 70–99)

## 2010-06-08 LAB — CONVERTED CEMR LAB
Blood Glucose, Fingerstick: 57
Hgb A1c MFr Bld: 5.4 %

## 2010-06-18 LAB — GLUCOSE, CAPILLARY: Glucose-Capillary: 88 mg/dL (ref 70–99)

## 2010-07-03 NOTE — Progress Notes (Signed)
Summary: refill/ hla  Phone Note Refill Request Message from:  Fax from Pharmacy on August 15, 2009 2:35 PM  Refills Requested: Medication #1:  GLUCOPHAGE 1000 MG TABS Take 1 tablet by mouth two times a day   Last Refilled: 2/7 pt has appt 4/5  Initial call taken by: Freddy Finner RN,  August 15, 2009 2:35 PM  Follow-up for Phone Call        one refill given.  Will need to come to appment next month for more refills Follow-up by: Adrian Prows MD,  August 16, 2009 4:18 PM    Prescriptions: GLUCOPHAGE 1000 MG TABS (METFORMIN HCL) Take 1 tablet by mouth two times a day  #60 x 0   Entered and Authorized by:   Adrian Prows MD   Signed by:   Adrian Prows MD on 08/16/2009   Method used:   Telephoned to ...       [facility] (retail)       [address] [place], [place]  [address]       Ph: [phone]       Fax: [phone]   RxID:   [id number]   Appended Document: refill/ hla called to pharm

## 2010-07-03 NOTE — Progress Notes (Signed)
Summary: refill/gg  Phone Note Refill Request   Refills Requested: Medication #1:  VIAGRA 50 MG  TABS Take one pill by mouth 1/2 an hour before intercourse   Last Refilled: 05/08/2009  Method Requested: Fax to [facility] Initial call taken by: Gevena Cotton RN,  August 22, 2009 3:04 PM  Follow-up for Phone Call        Refill approved-nurse to complete    Prescriptions: VIAGRA 50 MG  TABS (SILDENAFIL CITRATE) Take one pill by mouth 1/2 an hour before intercourse  #15 x 5   Entered and Authorized by:   Pershing Cox MD   Signed by:   Pershing Cox MD on 08/23/2009   Method used:   Telephoned to ...       [facility] (retail)       [address] [place]       [place],   [address]       Ph: [phone]       Fax: [phone]   RxID:   [id number]   Appended Document: refill/gg rx faxed in

## 2010-07-03 NOTE — Procedures (Signed)
Summary: Colonoscopy  Patient: [name] Note: All result statuses are Final unless otherwise noted.  Tests: (1) Colonoscopy (COL)   COL Colonoscopy           [facility]     [place],   [address]           COLONOSCOPY PROCEDURE REPORT           PATIENT:  [name], [name]  MR#:  [id number]     BIRTHDATE:  07-10-1950, 28 yrs. old  GENDER:  male           ENDOSCOPIST:  Sandy Salaam. [name], MD     Referred by:           PROCEDURE DATE:  01/15/2010     PROCEDURE:  Colonoscopy with polypectomy and submucosal injection,     Colonoscopy with tumor ablation     ASA CLASS:  Class II     INDICATIONS:  1) screening  2) history of pre-cancerous     (adenomatous) colon polyps 2009 sessile adenomatous right colon     polyp           MEDICATIONS:   Fentanyl 75 mcg, Versed 7 mg, Benadryl 25 mg IV           DESCRIPTION OF PROCEDURE:   After the risks benefits and     alternatives of the procedure were thoroughly explained, informed     consent was obtained.  Digital rectal exam was performed and     revealed no abnormalities.   The Pentax Colonoscope [id number]     endoscope was introduced through the anus and advanced to the     cecum, which was identified by both the appendix and ileocecal     valve, without limitations.  The quality of the prep was good,     using MoviPrep.  The instrument was then slowly withdrawn as the     colon was fully examined.  Photodocumentation not available.           FINDINGS:  A sessile polyp was found in the ascending colon. 4cm     sessile polyp distal ascending colon encompassing a colonic fold.     8cc NS was injected submucosally. Polyp was removed piecemeal. A     significant portion could not be snared because of sessile nature.     Remainder was fulgurated with the APC  This was otherwise a normal     examination of the colon.   Retroflexed views in the rectum     revealed no abnormalities.    The time to  cecum =  minutes. The     scope was then withdrawn (time =  min) from the patient and the     procedure completed.           COMPLICATIONS:  None           ENDOSCOPIC IMPRESSION:     1) Sessile polyp in the ascending colon     2) Otherwise normal examination     RECOMMENDATIONS:     1) Await biopsy results     2) call office next 1-3 days to schedule followup visit in 3     weeks           REPEAT EXAM:  No           ______________________________     Sandy Salaam. [name], MD  CC: Bertha Stakes, MD           n.     Lorrin MaisSandy Salaam. Kaplan at 01/15/2010 09:33 AM           Wynne Dust, [id number]  Note: An exclamation mark (!) indicates a result that was not dispersed into the flowsheet. Document Creation Date: 01/15/2010 9:35 AM _______________________________________________________________________  (1) Order result status: Final Collection or observation date-time: 01/15/2010 09:20 Requested date-time:  Receipt date-time:  Reported date-time:  Referring Physician:   Ordering Physician: Erskine Emery [phone]) Specimen Source:  Source: Tawanna Cooler Order Number: [id number] Lab site:

## 2010-07-03 NOTE — Progress Notes (Signed)
Summary: refill/ hla  Phone Note Refill Request Message from:  Fax from Pharmacy on December 07, 2009 10:18 AM  Refills Requested: Medication #1:  DIOVAN HCT 160-25 MG TABS 1/2 (one half) pill daily by mouth for your high blood pressure.   Dosage confirmed as above?Dosage Confirmed   Last Refilled: 6/21 Initial call taken by: Freddy Finner RN,  December 07, 2009 10:19 AM  Follow-up for Phone Call        Rx denied because Diovan filled by Dr. Collier Bullock on 7/7 Follow-up by: Pershing Cox MD,  December 07, 2009 8:47 PM

## 2010-07-03 NOTE — Assessment & Plan Note (Signed)
Summary: 2WK FU/SHAH/VS   Vital Signs:  Patient Profile:   60 Years Old Male Height:     71 inches (180.34 cm) Weight:      263.7 pounds (119.86 kg) BMI:     36.91 Temp:     99.0 degrees F (37.22 degrees C) oral Pulse rate:   81 / minute BP sitting:   145 / 83  (left arm) Cuff size:   large  Pt. in pain?   no  Vitals Entered By: Nadine Counts Deborra Medina) (July 15, 2008 11:42 AM)               Have you ever been in a relationship where you felt threatened, hurt or afraid?No   Does patient need assistance? Functional Status Self care Ambulation Normal     PCP:  Pershing Cox MD  Chief Complaint:  2wk f/u after starting insulin-pt unsure about which diabetic meds he suppose to be taking and needs rx for insulin.  History of Present Illness: 60 year old man comes to the clinic for follow up post change to Diabetic medical regimen. Patient states that he hasn't been taking metformin. He thoughtt that he did not have to take while starting insulin. Patient CBG's have not been adequately controlled ranging  in am from 254-419 and pm range 114-277.   Denies lightheadedness, night sweats, nightmares. Patient is sleeping better.    Updated Prior Medication List: GLUCOPHAGE 1000 MG TABS (METFORMIN HCL) Take 1 tablet by mouth two times a day LIPITOR 20 MG  TABS (ATORVASTATIN CALCIUM) Take 1 tablet by mouth at bedtime VIAGRA 50 MG  TABS (SILDENAFIL CITRATE) Take one pill by mouth 1/2 an hour before intercourse DIOVAN 80 MG  TABS (VALSARTAN) Take 1 tablet by mouth once a day HUMULIN 70/30 70-30 % SUSP (INSULIN ISOPHANE & REGULAR) take 40 units in the morning and 20 in the evening. 1ST CHOICE LANCETS SUPER THIN  MISC (LANCETS)  ACCUSURE INSULIN SYRINGE 30G X 5/16" 0.5 ML MISC (INSULIN SYRINGE-NEEDLE U-100)   Current Allergies: No known allergies   Past Medical History:    Reviewed history from 04/14/2007 and no changes required:       Diabetes mellitus, type II- non insulin  dependent            poorly controlled       Hyperlipidemia       Hypertension       Tachycardia:  Sinus,chronic                            ??H/o irregular heart beat per pt       Obstructive sleep apnea (hasn't worn his CPAP since 2004)       H/o Microcytic anemia with heme positve stools 1/06       Ongoing Tobacco abuse       H/o alcohol abuse   Family History:    Reviewed history from 04/12/2008 and no changes required:       Father died 72 years of age had diabetes, hypertension and amputations below knee bilateraly secondary to diabetes.       Mother is alive and healthy       Sister has hypertensive       Brother has sleep apnea          Social History:    Reviewed history from 04/12/2008 and no changes required:       part time working at [employer].  Lives with wife and kids.    Risk Factors: Tobacco use:  current    Cigarettes:  Yes -- 1 pack(s) per day  Colonoscopy History:     Date of Last Colonoscopy:  06/12/2007    Results:   Results: Polyp.  Results: Specimen sent for pathology.    Pathology:  Adenomatous polyp.        Location:  [place]. [name])     Review of Systems       The patient complains of headaches.  The patient denies fever, vision loss, chest pain, dyspnea on exertion, prolonged cough, abdominal pain, melena, hematochezia, severe indigestion/heartburn, muscle weakness, difficulty walking, unusual weight change, and abnormal bleeding.     Physical Exam  General:     NAD Lungs:     Normal respiratory effort, chest expands symmetrically. Lungs are clear to auscultation, no crackles or wheezes. Heart:     Normal rate and regular rhythm. S1 and S2 normal without gallop, murmur, click, rub or other extra sounds. Abdomen:     soft, non-tender, and normal bowel sounds.   Msk:     normal ROM.   Extremities:     no edema or cyanosis Neurologic:     alert & oriented X3, cranial nerves II-XII intact, and sensation intact to light  touch.      Impression & Recommendations:  Problem # 1:  DIABETES MELLITUS, TYPE II (ICD-250.00) As previously intended,  had patient start taking metformin with insulin. Patient was instructed to continue checking blood glucose levels at least twice a day and to bring meter with him next time. Patient will follow up with Barnabas Harries in two weeks for further assessment of glucose control. Patient had still not gone to Eye exam so referral was made. Explained the importance of yearly eye exam in diabetes.   His updated medication list for this problem includes:    Glucophage 1000 Mg Tabs (Metformin hcl) .Marland Kitchen... Take 1 tablet by mouth two times a day    Diovan 80 Mg Tabs (Valsartan) .Marland Kitchen... Take 1 tablet by mouth once a day    Humulin 70/30 70-30 % Susp (Insulin isophane & regular) .Marland Kitchen... Take 40 units in the morning and 20 in the evening.  Orders: Ophthalmology Referral (Ophthalmology)   Problem # 2:  HYPERTENSION (ICD-401.9) Not at goal <130/80. Will start patient on diuretic. Will continue to monitor in subsequent visits and further evaluate the need to increase HCTZ.  His updated medication list for this problem includes:    Diovan 80 Mg Tabs (Valsartan) .Marland Kitchen... Take 1 tablet by mouth once a day    Hydrochlorothiazide 12.5 Mg Caps (Hydrochlorothiazide) .Marland Kitchen... Take one tablet by mouth daily  BP today: 145/83 Prior BP: 147/90 (06/29/2008)  Labs Reviewed: Creat: 1.14 (06/16/2007) Chol: 113 (04/12/2008)   HDL: 45 (04/12/2008)   LDL: 53 (04/12/2008)   TG: 77 (04/12/2008)   Problem # 3:  HYPERLIPIDEMIA (ICD-272.4) Patient has not had a cmet for a year. Will order and further evaluate if there is a need to stop lipitor. LDL at goal. Will continue to monitor.  Last Lipid ProfileCholesterol: 113 (04/12/2008 8:25:00 PM)HDL:  45 (04/12/2008 8:25:00 PM)LDL:  53 (04/12/2008 8:25:00 PM)Triglycerides:  Last Liver profileSGOT:  23 (06/16/2007 8:25:00 PM)SPGT:  25 (06/16/2007 8:25:00 PM)T. Bili:  0.9  (06/16/2007 8:25:00 PM)Alk Phos:  58 (06/16/2007 8:25:00 PM)  His updated medication list for this problem includes:    Lipitor 20 Mg Tabs (Atorvastatin calcium) .Marland Kitchen... Take 1 tablet by  mouth at bedtime  Orders: T-Comprehensive Metabolic Panel ([id number])   Problem # 4:  TOBACCO ABUSE (ICD-305.1) Encouraged smoking cessation and discussed different methods for smoking cessation.   Complete Medication List: 1)  Glucophage 1000 Mg Tabs (Metformin hcl) .... Take 1 tablet by mouth two times a day 2)  Lipitor 20 Mg Tabs (Atorvastatin calcium) .... Take 1 tablet by mouth at bedtime 3)  Viagra 50 Mg Tabs (Sildenafil citrate) .... Take one pill by mouth 1/2 an hour before intercourse 4)  Diovan 80 Mg Tabs (Valsartan) .... Take 1 tablet by mouth once a day 5)  Humulin 70/30 70-30 % Susp (Insulin isophane & regular) .... Take 40 units in the morning and 20 in the evening. 6)  1st Choice Lancets Super Thin Misc (Lancets) 7)  Accusure Insulin Syringe 30g X 5/16" 0.5 Ml Misc (Insulin syringe-needle u-100) 8)  Hydrochlorothiazide 12.5 Mg Caps (Hydrochlorothiazide) .... Take one tablet by mouth daily   Patient Instructions: 1)  Please schedule a follow-up appointment in 2 weeks with [name] Diabetes Educator. 2)  You will be called with any abnormalities in the tests scheduled or performed today.  If you don't hear from [place] within a week from when the test was performed, you can assume that your test was normal.  3)  Start taking Glucophage 1000mg  by mouth two times a day along with Insulin 40 units in am and 20 units in pm. 4)  Start taking Hydrochlorothiazide 12.5mg  by mouth daily. 5)  Go to Eye Exam. 6)  Take all other medication as indicated.   Prescriptions: GLUCOPHAGE 1000 MG TABS (METFORMIN HCL) Take 1 tablet by mouth two times a day  #60 x 3   Entered and Authorized by:   Rudie Meyer MD   Signed by:   Rudie Meyer MD on 07/15/2008   Method used:   Print then Give to  Patient   RxID:   [id number] HUMULIN 70/30 70-30 % SUSP (INSULIN ISOPHANE & REGULAR) take 40 units in the morning and 20 in the evening.  #1 x 0   Entered and Authorized by:   Rudie Meyer MD   Signed by:   Rudie Meyer MD on 07/15/2008   Method used:   Print then Give to Patient   RxID:   [id number] HYDROCHLOROTHIAZIDE 12.5 MG CAPS (HYDROCHLOROTHIAZIDE) Take one tablet by mouth daily  #30 x 3   Entered and Authorized by:   Rudie Meyer MD   Signed by:   Rudie Meyer MD on 07/15/2008   Method used:   Print then Give to Patient   RxID:   [id number]    EGD  Procedure date:  06/09/2007  Findings:      Findings: Normal    EGD  Procedure date:  06/09/2007  Findings:      Findings: Esophagitis  Findings: Gastritis  Location: [place] [name])   Colonoscopy  Procedure date:  06/12/2007  Findings:       Results: Polyp.  Results: Specimen sent for pathology.    Pathology:  Adenomatous polyp.        Location:  [place]. [name])

## 2010-07-03 NOTE — Letter (Signed)
Summary: Previsit letter  [facility]  [place], [place] [address]   Phone: [phone]  Fax: [phone]       12/20/2009 MRN: [id number]  [facility] [address] [place], [place]  [address]  Dear [name],  Welcome to the Gastroenterology Division at [facility].    You are scheduled to see a nurse for your pre-procedure visit on 01/09/2010 at 10:00am on the 3rd floor at [facility], [place] [employer].  We ask that you try to arrive at our office 15 minutes prior to your appointment time to allow for check-in.  Your nurse visit will consist of discussing your medical and surgical history, your immediate family medical history, and your medications.    Please bring a complete list of all your medications or, if you prefer, bring the medication bottles and we will list them.  We will need to be aware of both prescribed and over the counter drugs.  We will need to know exact dosage information as well.  If you are on blood thinners (Coumadin, Plavix, Aggrenox, Ticlid, etc.) please call our office today/prior to your appointment, as we need to consult with your physician about holding your medication.   Please be prepared to read and sign documents such as consent forms, a financial agreement, and acknowledgement forms.  If necessary, and with your consent, a friend or relative is welcome to sit-in on the nurse visit with you.  Please bring your insurance card so that we may make a copy of it.  If your insurance requires a referral to see a specialist, please bring your referral form from your primary care physician.  No co-pay is required for this nurse visit.     If you cannot keep your appointment, please call [phone] to cancel or reschedule prior to your appointment date.  This allows [place] the opportunity to schedule an appointment for another patient in need of care.    Thank you for choosing [facility] for your medical needs.   We appreciate the opportunity to care for you.  Please visit [place] at our website  to learn more about our practice.                     Sincerely.                                                                                                                   The Gastroenterology Division

## 2010-07-03 NOTE — Assessment & Plan Note (Signed)
Summary: 2WK FU/VS   Vital Signs:  Patient Profile:   60 Years Old Male Height:     71 inches (180.34 cm) Weight:      262.05 pounds (119.11 kg) BMI:     36.68 Temp:     97.6 degrees F (36.44 degrees C) oral Pulse rate:   86 / minute BP sitting:   153 / 91  (right arm) Cuff size:   large  Pt. in pain?   no  Vitals Entered By: Campbell Lerner SMA (August 04, 2008 10:57 AM)              Is Patient Diabetic? Yes Did you bring your meter with you today? Yes Nutritional Status BMI of 25 - 29 = overweight  Does patient need assistance? Functional Status Self care Ambulation Normal Comments 08/04/2008 11:30 a.m BP re-check- 143/90 RA. CKF,SMA     PCP:  Pershing Cox MD  Chief Complaint:  FU/ Refill on insulin.  History of Present Illness: 60 year old man comes to the clinic for follow up post change to Diabetic medical regimen.  He states that he has not been able to take his pm dose of lantus because he needs to conserve his lantus because at the [facility] location, they have a hard time keeping up with the lantus. Apparently they run out of it sometimes, and his [facility] card expired. He has met with Ronny Bacon a long time ago, and has not discussed his problems with her since. He always takes the 40 units in the morning, including today. He has not had any episodes of light headedness, and he has started taking his metformin. He states that his sugars are never in the 100s, though he only checks his sugar in the morning. His meter states that he has some checks in the night and morning, but he states he never checks his sugar at night, so I think the timing of his meter may be off. He states that otherwise he is doing well.  He is scheduled to have an eye exam in the near future. His pmh and sh was reviewed and amended. He is scheduled to see Barnabas Harries today    Prior Medications Reviewed Using: Patient Recall  Prior Medication List:  GLUCOPHAGE 1000 MG TABS (METFORMIN HCL)  Take 1 tablet by mouth two times a day LIPITOR 20 MG  TABS (ATORVASTATIN CALCIUM) Take 1 tablet by mouth at bedtime VIAGRA 50 MG  TABS (SILDENAFIL CITRATE) Take one pill by mouth 1/2 an hour before intercourse DIOVAN 80 MG  TABS (VALSARTAN) Take 1 tablet by mouth once a day HUMULIN 70/30 70-30 % SUSP (INSULIN ISOPHANE & REGULAR) take 40 units in the morning and 20 in the evening. 1ST CHOICE LANCETS SUPER THIN  MISC (LANCETS)  ACCUSURE INSULIN SYRINGE 30G X 5/16" 0.5 ML MISC (INSULIN SYRINGE-NEEDLE U-100)  HYDROCHLOROTHIAZIDE 12.5 MG CAPS (HYDROCHLOROTHIAZIDE) Take one tablet by mouth daily   Updated Prior Medication List: GLUCOPHAGE 1000 MG TABS (METFORMIN HCL) Take 1 tablet by mouth two times a day LIPITOR 20 MG  TABS (ATORVASTATIN CALCIUM) Take 1 tablet by mouth at bedtime VIAGRA 50 MG  TABS (SILDENAFIL CITRATE) Take one pill by mouth 1/2 an hour before intercourse DIOVAN 80 MG  TABS (VALSARTAN) Take 1 tablet by mouth once a day HUMULIN 70/30 70-30 % SUSP (INSULIN ISOPHANE & REGULAR) take 40 units in the morning and 20 in the evening. 1ST CHOICE LANCETS SUPER THIN  MISC (LANCETS)  ACCUSURE INSULIN  SYRINGE 30G X 5/16" 0.5 ML MISC (INSULIN SYRINGE-NEEDLE U-100)  HYDROCHLOROTHIAZIDE 12.5 MG CAPS (HYDROCHLOROTHIAZIDE) Take one tablet by mouth daily  Current Allergies: No known allergies     Risk Factors:  Tobacco use:  current    Cigarettes:  Yes -- 1 pack(s) per day Alcohol use:  no  Colonoscopy History:    Date of Last Colonoscopy:  06/12/2007   Review of Systems      See HPI   Physical Exam  General:     NAD Mouth:     pharynx pink and moist.  poor dentition with multiple teeth missing Lungs:     Normal respiratory effort, chest expands symmetrically. Lungs are clear to auscultation, no crackles or wheezes. Heart:     Normal rate and regular rhythm. S1 and S2 normal without gallop, murmur, click, rub or other extra sounds. Abdomen:     soft, non-tender, and normal  bowel sounds.   Neurologic:     alert & oriented X3, cranial nerves II-XII intact, and sensation intact to light touch.   Psych:     Oriented X3, memory intact for recent and remote, normally interactive, good eye contact, and not anxious appearing.      Impression & Recommendations:  Problem # 1:  DIABETES MELLITUS, TYPE II (ICD-250.00) He needs his pm dose of 70/30, simple as that. He is scheduled to see Barnabas Harries today and I will have him see Ronny Bacon as well to hopefully help prevent him from not getting his medications. I do believe him that he was having trouble with the medications because the [facility] can be confusing, and he has had trouble with it in the past. I think the best thing for him would be to follow up again with Barnabas Harries in early April for a hgba1c recheck on correct dose of the 70/30 and metformin, and with two times a day cbgs. He is agreeable to this.  A total of 20 minutes was spent with Ms. Beverely Low. His updated medication list for this problem includes:    Glucophage 1000 Mg Tabs (Metformin hcl) .Marland Kitchen... Take 1 tablet by mouth two times a day    Diovan 80 Mg Tabs (Valsartan) .Marland Kitchen... Take 1 tablet by mouth once a day    Humulin 70/30 70-30 % Susp (Insulin isophane & regular) .Marland Kitchen... Take 40 units in the morning and 20 in the evening.  Labs Reviewed: HgBA1c: 11.2 (06/29/2008)   Creat: 1.14 (07/15/2008)   Microalbumin: 3.79 (04/12/2008)   Problem # 2:  HYPERTENSION (ICD-401.9) HIs blood pressure was good in 11/09, and had been on multiple prior visits. The HCTZ was added because of the prior pressure of 145/83, and it has had a month to work. I rechecked his blood pressure after he had time to relax, and informed him he would not need to have blood drawn, which he hates and causes anxiety. His rechecked blood pressure was 142/91, which is still above target, so I will increase the HCTZ to 25mg  by mouth once daily, and follow up for effect. His updated medication list for  this problem includes:    Diovan 80 Mg Tabs (Valsartan) .Marland Kitchen... Take 1 tablet by mouth once a day    Hydrochlorothiazide 25 Mg Tabs (Hydrochlorothiazide) .Marland Kitchen... Take 1 tablet by mouth once a day  BP today: 153/91 Prior BP: 145/83 (07/15/2008)  Labs Reviewed: Creat: 1.14 (07/15/2008) Chol: 113 (04/12/2008)   HDL: 45 (04/12/2008)   LDL: 53 (04/12/2008)   TG: 77 (04/12/2008)  Problem # 3:  HYPERLIPIDEMIA (B2193296.4) Reviewed from November and at target. Will not check again for about 1 year. His updated medication list for this problem includes:    Lipitor 20 Mg Tabs (Atorvastatin calcium) .Marland Kitchen... Take 1 tablet by mouth at bedtime  Labs Reviewed: Chol: 113 (04/12/2008)   HDL: 45 (04/12/2008)   LDL: 53 (04/12/2008)   TG: 77 (04/12/2008) SGOT: 37 (07/15/2008)   SGPT: 28 (07/15/2008)   Complete Medication List: 1)  Glucophage 1000 Mg Tabs (Metformin hcl) .... Take 1 tablet by mouth two times a day 2)  Lipitor 20 Mg Tabs (Atorvastatin calcium) .... Take 1 tablet by mouth at bedtime 3)  Viagra 50 Mg Tabs (Sildenafil citrate) .... Take one pill by mouth 1/2 an hour before intercourse 4)  Diovan 80 Mg Tabs (Valsartan) .... Take 1 tablet by mouth once a day 5)  Humulin 70/30 70-30 % Susp (Insulin isophane & regular) .... Take 40 units in the morning and 20 in the evening. 6)  1st Choice Lancets Super Thin Misc (Lancets) 7)  Accusure Insulin Syringe 30g X 5/16" 0.5 Ml Misc (Insulin syringe-needle u-100) 8)  Hydrochlorothiazide 25 Mg Tabs (Hydrochlorothiazide) .... Take 1 tablet by mouth once a day   Patient Instructions: 1)  Please note the change in HCTZ to a 25mg  tab by mouth once daily. 2)  Please check your sugar in the morning before breakfast, and before dinner to have an accurate picture of how well your sugar is controlled. 3)  Please see Barnabas Harries again in early April for a recheck of your diabetes while on the proper dose of insulin, and having checked your sugar twice daily. 4)  You  should see a physician again in May for follow up, or at the end of April if you are unable to see [name] again. 5)  Please contact Bonna Gains if you are having trouble acquiring your medications.   Prescriptions: HUMULIN 70/30 70-30 % SUSP (INSULIN ISOPHANE & REGULAR) take 40 units in the morning and 20 in the evening.  #6 x 0   Entered and Authorized by:   Jacolyn Reedy MD   Signed by:   Jacolyn Reedy MD on 08/04/2008   Method used:   Print then Give to Patient   [id number] GLUCOPHAGE 1000 MG TABS (METFORMIN HCL) Take 1 tablet by mouth two times a day  #60 x prn   Entered and Authorized by:   Jacolyn Reedy MD   Signed by:   Jacolyn Reedy MD on 08/04/2008   Method used:   Print then Give to Patient   RxIDQM:[id number] HYDROCHLOROTHIAZIDE 25 MG TABS (HYDROCHLOROTHIAZIDE) Take 1 tablet by mouth once a day  #31 x prn   Entered and Authorized by:   Jacolyn Reedy MD   Signed by:   Jacolyn Reedy MD on 08/04/2008   Method used:   Print then Give to Patient   RxID:   [id number]

## 2010-07-03 NOTE — Assessment & Plan Note (Signed)
Summary: EST-CK/FU/MEDS/CFB   Vital Signs:  Patient Profile:   60 Years Old Male Height:     71 inches Weight:      273.5 pounds Temp:     98.3 degrees F oral Pulse rate:   96 / minute BP sitting:   120 / 82  (right arm)  Vitals Entered By: Silverio Decamp (June 16, 2007 1:52 PM)             Is Patient Diabetic? Yes   Does patient need assistance? Functional Status Self care Ambulation Normal     PCP:  Burton Apley MD  Chief Complaint:  TEST RESULTS.  History of Present Illness: [name] is a 60 y/o AAM with a h/o HTN, DM and HL who presents to the clinic today for a routine check up. He has now started taking all his medications after his prescriptions were filled by Dr. Riccardo Dubin. He does not know how to check his blood sugars using the True track meter he was given at his last visit and would like to learn so today. He has no complaints.  Current Allergies: No known allergies     Risk Factors: Tobacco use:  current    Cigarettes:  Yes -- 1/2 pack(s) per day   Review of Systems  The patient denies chest pain, syncope, dyspnea on exhertion, and peripheral edema.     Physical Exam  General:     alert, well-developed, and well-nourished.   Head:     atraumatic.   Eyes:     pupils equal, pupils round, and pupils reactive to light.   Mouth:     pharynx pink and moist.   Neck:     supple.   Lungs:     normal respiratory effort, normal breath sounds, no crackles, and no wheezes.   Heart:     normal rate, regular rhythm, no murmur, no gallop, and no rub.   Abdomen:     soft, non-tender, normal bowel sounds, and no distention.   Pulses:     R and L dorsalis pedis and posterior tibial pulses are full and equal bilaterally Extremities:     No clubbing, cyanosis, edema, or deformity noted with normal full range of motion of all joints.      Impression & Recommendations:  Problem # 1:  DIABETES MELLITUS, TYPE II (ICD-250.00) HgbA1C was 13.7 today  but this is only after 1 and a half month of being on treatment. Now that pt knows how to check his blood sugars with a meter, he was encouraged to do so and to bring his meter on his next visit. No medication changes for now. If his HgbA1C is still elevated at his next visit, insulin would be his next option. Pt aware. Diet control, weight reduction and exercise encouraged.  The following medications were removed from the medication list:    Diovan 80 Mg Tabs (Valsartan) .Marland Kitchen... Take 1 tablet by mouth once a day  His updated medication list for this problem includes:    Glucophage 1000 Mg Tabs (Metformin hcl) .Marland Kitchen... Take 1 tablet by mouth two times a day    Glipizide 10 Mg Tabs (Glipizide) .Marland Kitchen... Take 1 tablet by mouth two times a day    Lisinopril 5 Mg Tabs (Lisinopril) .Marland Kitchen... Take one pill by mouth once a day  Orders: T-Hgb A1C (in-house) [id number])  Labs Reviewed: Creat: 1.15 (04/14/2007)      Problem # 2:  HYPERTENSION (ICD-401.9) SBP ideally needs to be at  110. Will increase Lisinopril to 5mg  by mouth daily.   The following medications were removed from the medication list:    Diovan 80 Mg Tabs (Valsartan) .Marland Kitchen... Take 1 tablet by mouth once a day  His updated medication list for this problem includes:    Lisinopril 5 Mg Tabs (Lisinopril) .Marland Kitchen... Take one pill by mouth once a day  Orders: T-Comprehensive Metabolic Panel ([id number])  BP today: 120/82 Prior BP: 135/85 (04/14/2007)  Labs Reviewed: Creat: 1.15 (04/14/2007) Chol: 153 (04/14/2007)   HDL: 44 (04/14/2007)   LDL: 85 (04/14/2007)   TG: 120 (04/14/2007)   Problem # 3:  HYPERLIPIDEMIA (ICD-272.4) FLP much improved since last check. LDL goal <70. Will repeat FLP next week. His updated medication list for this problem includes:    Lipitor 20 Mg Tabs (Atorvastatin calcium) .Marland Kitchen... Take 1 tablet by mouth at bedtime  Future Orders: T-Lipid Profile [id number]) ... 06/23/2007  Labs Reviewed: Chol: [id number] (04/14/2007)   HDL: 44  (04/14/2007)   LDL: 85 (04/14/2007)   TG: 120 (04/14/2007) SGOT: 37 (04/14/2007)   SGPT: 28 (04/14/2007)   Problem # 4:  MICROCYTIC ANEMIA (ICD-281.9) Repeat CBC pending for today. Pt has had a colonoscopy and EGD done last week at [facility]. Results pending. Per pt's report , only a polyp was found.  Orders: T-CBC No Diff [id number])   Complete Medication List: 1)  Glucophage 1000 Mg Tabs (Metformin hcl) .... Take 1 tablet by mouth two times a day 2)  Glipizide 10 Mg Tabs (Glipizide) .... Take 1 tablet by mouth two times a day 3)  Lipitor 20 Mg Tabs (Atorvastatin calcium) .... Take 1 tablet by mouth at bedtime 4)  Viagra 50 Mg Tabs (Sildenafil citrate) .... Take one pill by mouth 1/2 an hour before intercourse 5)  Lisinopril 5 Mg Tabs (Lisinopril) .... Take one pill by mouth once a day   Patient Instructions: 1)  Please schedule a follow-up appointment in 1 month. 2)  Check your blood sugars at least twice a day and record them in your log book. If your readings are usually above 250 or below 70, you should contact our office. 3)  You Hemoglobin A1C was 13.7 today which is in the danger zone. Please watch what you eat.  4)  Check your feet each night for sore areas, calluses or signs of infection. 5)  Please note that your blood pressure pill, Lisinopril has been increased to 5mg  one pill once a day. STOP taking half a pill. 6)  Advised not to eat any food or drink any liquids after 10 PM the night before your cholesterol test.    Prescriptions: LISINOPRIL 5 MG  TABS (LISINOPRIL) Take one pill by mouth once a day  #31 x 6   Entered and Authorized by:   Dionicio Stall MD   Signed by:   Dionicio Stall MD on 06/16/2007   Method used:   Print then Give to Patient   RxID:   [id number] VIAGRA 50 MG  TABS (SILDENAFIL CITRATE) Take one pill by mouth 1/2 an hour before intercourse  #15 x 5   Entered and Authorized by:   Dionicio Stall MD   Signed by:   Dionicio Stall MD on  06/16/2007   Method used:   Print then Give to Patient   RxID:   [id number]  ] Laboratory Results   Blood Tests   Date/Time Recieved: June 16, 2007 3:04 PM  Date/Time Reported: ..................................................................Marland KitchenMelvia Heaps  June 16, 2007 3:04 PM  HGBA1C: 13.7%   (Normal Range: Non-Diabetic - 3-6%   Control Diabetic - 6-8%)

## 2010-07-03 NOTE — Progress Notes (Signed)
Summary: change insulin for short time/ hla  Phone Note From Pharmacy   Summary of Call: [facility], is waiting on pt's insulin from [facility]. pt is out of insulin, may they give him a bottle of novolin 70/30 until his comes? if so please let med list reflect this one time change, i will send script request Initial call taken by: Freddy Finner RN,  December 05, 2009 10:43 AM  Follow-up for Phone Call        i am unclear whether you want a sepearte script or just fax to pharmacy.  Follow-up by: Pershing Cox MD,  December 05, 2009 10:34 PM

## 2010-07-03 NOTE — Letter (Signed)
Summary: diabetic foot exam  diabetic foot exam   Imported By: Garlan Fillers 04/14/2006 18:46:14  _____________________________________________________________________  External Attachment:    Type:   Image     Comment:   External Document

## 2010-07-03 NOTE — Progress Notes (Signed)
Summary: Medication change  Phone Note Refill Request Message from:  Patient on July 17, 2007 10:55 AM  Pt would like to switch back to Diovan 80 mg tablets because he can get it free from the [facility].  Is currently on Lisinopril 5 mg tablets 1 daily.  Initial call taken by: Sander Nephew RN,  July 17, 2007 10:57 AM  Follow-up for Phone Call        Refill approved-nurse to complete. Will check a BMET in a week to monitor K and creat. Follow-up by: Burton Apley MD,  July 17, 2007 11:02 AM  Additional Follow-up for Phone Call Additional follow up Details #1::        Rx called to pharmacy.  Pharmacy requests a signed prescription to put on file.  Pt picked up the Lisinopril this am.  Pt to be called and set up for lab work. Call to pt message left for pt to call Clinics on Monday .  Pt needs appointment for labwork . Additional Follow-up by: Sander Nephew RN,  July 17, 2007 4:45 PM    New/Updated Medications: DIOVAN 80 MG  TABS (VALSARTAN) Take 1 tablet by mouth once a day   Prescriptions: DIOVAN 80 MG  TABS (VALSARTAN) Take 1 tablet by mouth once a day  #31 x 11   Entered and Authorized by:   Burton Apley MD   Signed by:   Burton Apley MD on 07/19/2007   Method used:   Telephoned to ...       [facility]       [place], [place]  [address]       Ph: [phone]       Fax: [phone]   RxID:   [id number]

## 2010-07-03 NOTE — Progress Notes (Signed)
Summary: refill request  Phone Note Refill Request Message from:  Fax from Pharmacy on August 09, 2008 3:16 PM  Refills Requested: Medication #1:  DIOVAN 80 MG  TABS Take 1 tablet by mouth once a day   Last Refilled: 03/15/2008 patient [facility]   Method Requested: Fax to [facility] Initial call taken by: Antony Blackbird   BA.,CPht II,  August 09, 2008 3:17 PM  Follow-up for Phone Call        Refill approved-nurse to complete      Prescriptions: DIOVAN 80 MG  TABS (VALSARTAN) Take 1 tablet by mouth once a day  #31 x 11   Entered and Authorized by:   Pershing Cox MD   Signed by:   Pershing Cox MD on 08/10/2008   Method used:   Print then Give to Patient   RxID:   [id number]

## 2010-07-03 NOTE — Progress Notes (Signed)
Summary: med refill/gp  Phone Note Refill Request Message from:  Fax from Pharmacy on May 25, 2009 3:57 PM  Refills Requested: Medication #1:  HUMULIN 70/30 70-30 % SUSP take 40 units in the morning and 20 in the evening.   Last Refilled: 04/20/2009  Method Requested: Telephone to Pharmacy Initial call taken by: Morrison Old RN,  May 25, 2009 3:57 PM  Follow-up for Phone Call        Rx refill request faxed to [facility]. Follow-up by: Morrison Old RN,  May 25, 2009 4:20 PM    Prescriptions: HUMULIN 70/30 70-30 % SUSP (INSULIN ISOPHANE & REGULAR) take 40 units in the morning and 20 in the evening.  #6 vials x 11   Entered and Authorized by:   Milta Deiters MD   Signed by:   Milta Deiters MD on 05/25/2009   Method used:   Telephoned to ...       [facility] (retail)       [address] [place], [place]  [address]       Ph: [phone]       Fax: [phone]   RxID:   [id number]

## 2010-07-03 NOTE — Progress Notes (Signed)
Summary: med refill/gp  Phone Note Refill Request Message from:  Fax from Pharmacy on May 06, 2007 4:25 PM  Refills Requested: Medication #1:  Diovan 160mg   1tab by mouth daily Pt. can get Diovan free from [facility] and now he is paying for Lisinopril;  they wanted to [facility] if he can be swith back to Diovan  and need new RX  Initial call taken by: Morrison Old RN,  May 06, 2007 4:29 PM  Follow-up for Phone Call        Refill approved-nurse to complete.  Needs a f/u aptmt in the next month. Follow-up by: Burton Apley MD,  May 07, 2007 2:18 PM  Additional Follow-up for Phone Call Additional follow up Details #1::        Rx faxed to pharmacy Additional Follow-up by: Gevena Cotton RN,  May 08, 2007 2:02 PM    New/Updated Medications: DIOVAN 80 MG  TABS (VALSARTAN) Take 1 tablet by mouth once a day   Prescriptions: DIOVAN 80 MG  TABS (VALSARTAN) Take 1 tablet by mouth once a day  #31 x 5   Entered by:   Burton Apley MD   Authorized by:   Marland Kitchen [facility] Triage Nurse   Signed by:   Burton Apley MD on 05/07/2007   Method used:   Telephoned to ...       [facility]       [place], [place]  [address]       Ph: [phone]       Fax: [phone]   RxID:   [id number]

## 2010-07-03 NOTE — Procedures (Signed)
Summary: EGD   EGD  Procedure date:  06/09/2007  Findings:      Findings: Duodenitis  Findings: Esophagitis  Findings: Stricture:  Location: [facility]   Patient Name: [name], [name] MRN: [id number] Procedure Procedures: Panendoscopy (EGD) CPT: T1461772.    with biopsy(s)/brushing(s). CPT: U5434024.    with esophageal dilation. CPT: W9155428.  Personnel: Endoscopist: Sandy Salaam. [name], MD.  Indications  Evaluation of: Anemia,  Positive fecal occult blood test  Symptoms: Dysphagia.  History  Current Medications: Patient is not currently taking Coumadin.  Pre-Exam Physical: Performed Jun 09, 2007  Cardio-pulmonary exam WNL. HEENT exam abnormal. Abdominal exam WNL. Abdominal exam, Extremity exam abnormal. Neurological exam, Mental status exam WNL.  Exam Exam Info: Maximum depth of insertion Duodenum, intended Duodenum. Patient position: on left side. Vocal cords visualized. ASA Classification: II. Tolerance: good.  Sedation Meds: Patient assessed and found to be appropriate for moderate (conscious) sedation. Fentanyl 50 mcg. given IV. Versed 4 mg. given IV. Robinul 0.2 given IV. Cetacaine Spray 2 sprays given aerosolized.  Monitoring: BP and pulse monitoring done. Oximetry used. Supplemental O2 given at 2 Liters.  Findings HIATAL HERNIA: Regular, 4 cms. in length.  - ESOPHAGEAL INFLAMMATION: Proximal margin 38 cm from mouth,  distal margin 40 cm. Length of inflammation: 2 cm. Edema present. [place] Classification: Grade C. Biopsy/Esoph Inflamtn taken. ICD9: Esophagitis: 530.10. Comments: Bxs taken to r/o Barrett's esophagus.  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  40 cm from mouth. ICD9: Esophageal Stricture: 530.3.  - Dilation: Distal Esophagus. Maloney dilator used, Diameter: 18 mm, Minimal Resistance, No Heme present on extraction. Outcome: successful.  - MUCOSAL ABNORMALITY: Duodenal Bulb. Erythematous mucosa. RUT done, results pending.  ICD9: Duodenitis without Hemorrhage: 535.60.  - Normal: Fundus to Antrum.   Assessment Abnormal examination, see findings above.  Diagnoses: 530.10: Esophagitis.  530.3: Esophageal Stricture.  535.60: Duodenitis without Hemorrhage.   Events  Unplanned Intervention: No unplanned interventions were required.  Unplanned Events: There were no complications. Plans Medication(s): Await pathology. PPI: Aciphex 20 mg QD, starting Jun 09, 2007   Scheduling: Colonoscopy, around    This report was created from the original endoscopy report, which was reviewed and signed by the above listed endoscopist.

## 2010-07-03 NOTE — Assessment & Plan Note (Signed)
Summary: (ACUTE-SHAH)COLD SWEATS, AND FAST HEART RATE/CH   Vital Signs:  Patient profile:   60 year old male Weight:      263 pounds BSA:     2.37 Temp:     97 degrees F Pulse rate:   99 / minute Resp:     20 per minute BP sitting:   132 / 84  (left arm)  Vitals Entered By: Gevena Cotton RN (Oct 24, 2008 11:19 AM)  Primary Care Provider:  Pershing Cox MD   History of Present Illness: 60 y/o with PMH of HTN, DM II (on insulin for 3 months) and HLP comes in for sweating, lightheadedness, palpitation and tremolous this am. This is the first time this has ever happened to him. He also relates some abdominal discomfort. He took some candy and his symptoms resolved, but after 30 minutes his symptoms returned. He then took a coke which improved his symptoms.  He feel ok now. He was brought in by his wife. He relates no CP, SOB nausea or vomitting.  Medications Prior to Update: 1)  Glucophage 1000 Mg Tabs (Metformin Hcl) .... Take 1 Tablet By Mouth Two Times A Day 2)  Lipitor 20 Mg  Tabs (Atorvastatin Calcium) .... Take 1 Tablet By Mouth At Bedtime 3)  Viagra 50 Mg  Tabs (Sildenafil Citrate) .... Take One Pill By Mouth 1/2 An Hour Before Intercourse 4)  Diovan 80 Mg  Tabs (Valsartan) .... Take 1 Tablet By Mouth Once A Day 5)  Humulin 70/30 70-30 % Susp (Insulin Isophane & Regular) .... Take 40 Units in The Morning and 20 in The Evening. 6)  1st Choice Lancets Super Thin  Misc (Lancets) 7)  Accusure Insulin Syringe 30g X 5/16" 0.5 Ml Misc (Insulin Syringe-Needle U-100) 8)  Hydrochlorothiazide 25 Mg Tabs (Hydrochlorothiazide) .... Take 1 Tablet By Mouth Once A Day  Allergies (verified): No Known Drug Allergies  Review of Systems  The patient denies fever, weight loss, vision loss, prolonged cough, hematochezia, hematuria, genital sores, muscle weakness, transient blindness, depression, enlarged lymph nodes, and testicular masses.    Physical Exam  General:   Well-developed,well-nourished,in no acute distress; alert,appropriate and cooperative throughout examination Lungs:  Normal respiratory effort, chest expands symmetrically. Lungs are clear to auscultation, no crackles or wheezes. Heart:  Normal rate and regular rhythm. S1 and S2 normal without gallop, murmur, click, rub or other extra sounds. Abdomen:  Bowel sounds positive,abdomen soft and non-tender without masses, organomegaly or hernias noted.   Impression & Recommendations:  Problem # 1:  DIABETIC HYPOGLYCEMIA, TYPE II (ICD-250.80) Hypoglycemia, on insulin, BG now 92. I have explained to him how to take care of this episodes. He relates he has not missed a meal and he is not sick. He has been eating as regularly. He has been on insulin for 3 months and this is the first time this has happened. I will get a spot urine, HBGA1c. He is to follow up with his PCP. His updated medication list for this problem includes:    Glucophage 1000 Mg Tabs (Metformin hcl) .Marland Kitchen... Take 1 tablet by mouth two times a day    Diovan 80 Mg Tabs (Valsartan) .Marland Kitchen... Take 1 tablet by mouth once a day    Humulin 70/30 70-30 % Susp (Insulin isophane & regular) .Marland Kitchen... Take 40 units in the morning and 20 in the evening.  Problem # 2:  HYPERTENSION (ICD-401.9) Good control. His updated medication list for this problem includes:    Diovan 80 Mg  Tabs (Valsartan) .Marland Kitchen... Take 1 tablet by mouth once a day    Hydrochlorothiazide 25 Mg Tabs (Hydrochlorothiazide) .Marland Kitchen... Take 1 tablet by mouth once a day  BP today: 132/84 Prior BP: 153/91 (08/04/2008)  Labs Reviewed: K+: 5.0 (07/15/2008) Creat: : 1.14 (07/15/2008)   Chol: 113 (04/12/2008)   HDL: 45 (04/12/2008)   LDL: 53 (04/12/2008)   TG: 77 (04/12/2008)  Problem # 3:  DIABETES MELLITUS, TYPE II (ICD-250.00) Check a spot and HbgA1c. uptodate on foot and eye exam. His updated medication list for this problem includes:    Glucophage 1000 Mg Tabs (Metformin hcl) .Marland Kitchen... Take 1  tablet by mouth two times a day    Diovan 80 Mg Tabs (Valsartan) .Marland Kitchen... Take 1 tablet by mouth once a day    Humulin 70/30 70-30 % Susp (Insulin isophane & regular) .Marland Kitchen... Take 40 units in the morning and 20 in the evening.  Orders: T-Hgb A1C (in-house) [id number]) T- Capillary Blood Glucose [id number]) T-Urine Microalbumin w/creat. ratio [id number] / [id number])  Problem # 4:  HYPERLIPIDEMIA (ICD-272.4) Very good. His updated medication list for this problem includes:    Lipitor 20 Mg Tabs (Atorvastatin calcium) .Marland Kitchen... Take 1 tablet by mouth at bedtime  Labs Reviewed: SGOT: 37 (07/15/2008)   SGPT: 28 (07/15/2008)   HDL:45 (04/12/2008), 44 (04/14/2007)  LDL:53 (04/12/2008), 85 (04/14/2007)  Chol:113 (04/12/2008), 153 (04/14/2007)  Trig:77 (04/12/2008), 120 (04/14/2007)  Complete Medication List: 1)  Glucophage 1000 Mg Tabs (Metformin hcl) .... Take 1 tablet by mouth two times a day 2)  Lipitor 20 Mg Tabs (Atorvastatin calcium) .... Take 1 tablet by mouth at bedtime 3)  Viagra 50 Mg Tabs (Sildenafil citrate) .... Take one pill by mouth 1/2 an hour before intercourse 4)  Diovan 80 Mg Tabs (Valsartan) .... Take 1 tablet by mouth once a day 5)  Humulin 70/30 70-30 % Susp (Insulin isophane & regular) .... Take 40 units in the morning and 20 in the evening. 6)  1st Choice Lancets Super Thin Misc (Lancets) 7)  Accusure Insulin Syringe 30g X 5/16" 0.5 Ml Misc (Insulin syringe-needle u-100) 8)  Hydrochlorothiazide 25 Mg Tabs (Hydrochlorothiazide) .... Take 1 tablet by mouth once a day  Patient Instructions: 1)  Please schedule an appointment with your primary doctor in : 3 month.  Appended Document: Results of HGB A1C    Lab Visit   Laboratory Results   Blood Tests   Date/Time Received: Oct 24, 2008 11:43 AM  Date/Time Reported: Melvia Heaps  Oct 24, 2008 11:43 AM]   HGBA1C: 6.3%   (Normal Range: Non-Diabetic - 3-6%   Control Diabetic - 6-8%)     Orders Today:

## 2010-07-03 NOTE — Progress Notes (Signed)
Summary: GI Referral  Phone Note Outgoing Call   Call placed by: Sander Nephew, RN May 05, 2007 10:15 AM Call placed to: Patient Summary of Call: Attempts to call pt to notify of appointment with [facility].  Msg left for pt to call the Clinics.  Appointment has been scheduled for 05/12/07.  Letter sent to notify pt of appointment.. ..................................................................Marland KitchenSander Nephew RN  May 07, 2007 11:53 AM RTC from pt given information about GI referral on 05/12/07.  Pt was given directions to [facility].  Pt voiced understanding of information given.  ..................................................................Marland KitchenSander Nephew RN  May 11, 2007 11:54 AM  Initial call taken by: Sander Nephew RN,  May 07, 2007 11:53 AM

## 2010-07-03 NOTE — Progress Notes (Signed)
Summary: prep too expensive   Phone Note From Other Clinic Call back at [phone]   Caller: Patient Caller: [name] from [facility] Call For: [name] Summary of Call: [name] states that they do not carry Moviprep and pt cannot afford it wants to know if theres anything cheaper that can be given to him or do we have samples. Initial call taken by: Ronalee Red,  January 10, 2010 3:19 PM  Follow-up for Phone Call        Talked with [name].  She will call patient to come by office and pick up Moviprep. Follow-up by: Ulice Dash RN,  January 10, 2010 3:33 PM

## 2010-07-03 NOTE — Assessment & Plan Note (Signed)
Summary: PER DR JOINES/ see note/bp, labs/pcp-shah/hla   Vital Signs:  Patient profile:   60 year old male Height:      71 inches Weight:      258.8 pounds BMI:     36.23 Temp:     98.7 degrees F oral Pulse rate:   63 / minute BP sitting:   147 / 87  (right arm)  Vitals Entered By: Silverio Decamp NT II (December 07, 2009 10:16 AM) CC: follow-up visit Is Patient Diabetic? Yes Did you bring your meter with you today? No Pain Assessment Patient in pain? no      Nutritional Status BMI of > 30 = obese  Have you ever been in a relationship where you felt threatened, hurt or afraid?No   Does patient need assistance? Functional Status Self care Ambulation Normal   Primary Care Provider:  Pershing Cox MD  CC:  follow-up visit.  History of Present Illness: Patient is a 60 year old male who present today for a follow up on his blood pressure.  He was last seen in clinic in April of 2011 and his blood pressure was a little above his goal.  He was given a prescription for Diovan 160 and HCTZ 25 mg.  He did not take these medications because he couldn't get them so he was given a combo pill of Diovan HCT 160/25 in June.  He took the medication for 4 days and had a near syncopal episode while at work.  He stopped the medications approximately a week ago and made his appointment for today.  He states that he feels well today.  Denies lightheadedness, nausea, headache, or changes in vision.  Preventive Screening-Counseling & Management  Alcohol-Tobacco     Smoking Status: current     Smoking Cessation Counseling: yes     Packs/Day: 1     Tobacco Counseling: to quit use of tobacco products  Caffeine-Diet-Exercise     Does Patient Exercise: yes     Type of exercise: WALK     Exercise (avg: min/session): 1HOUR     Times/week: 3-4  Current Medications (verified): 1)  Glucophage 1000 Mg Tabs (Metformin Hcl) .... Take 1 Tablet By Mouth Two Times A Day 2)  Lipitor 20 Mg  Tabs (Atorvastatin  Calcium) .... Take 1 Tablet By Mouth At Bedtime 3)  Viagra 50 Mg  Tabs (Sildenafil Citrate) .... Take One Pill By Mouth 1/2 An Hour Before Intercourse 4)  Humulin 70/30 70-30 % Susp (Insulin Isophane & Regular) .... Take 40 Units in The Morning and 20 in The Evening. 5)  1st Choice Lancets Super Thin  Misc (Lancets) 6)  Accusure Insulin Syringe 30g X 5/16" 0.5 Ml Misc (Insulin Syringe-Needle U-100) 7)  Diovan 80 Mg Tabs (Valsartan) .... Take 1/2 Tablet By Mouth Once Per Day For Blood Pressure Until Your Follow Up  Allergies (verified): No Known Drug Allergies  Past History:  Past medical, surgical, family and social histories (including risk factors) reviewed for relevance to current acute and chronic problems.  Past Medical History: Reviewed history from 09/05/2009 and no changes required. Diabetes mellitus, type II-  insulin dependent Hyperlipidemia Hypertension Tachycardia:  Sinus,chronic                      ??H/o irregular heart beat per pt Obstructive sleep apnea (hasn't worn his CPAP since 2004) H/o Microcytic anemia with heme positve stools 1/06 Ongoing Tobacco abuse H/o alcohol abuse  Family History: Reviewed history  from 04/12/2008 and no changes required. Father died 28 years of age had diabetes, hypertension and amputations below knee bilateraly secondary to diabetes. Mother is alive and healthy Sister has hypertensive Brother has sleep apnea    Social History: Reviewed history from 04/12/2008 and no changes required. part time working at [employer]. Lives with wife and kids.   Review of Systems      See HPI  Physical Exam  Additional Exam:  General: Well developed, male in no acute distress  Head: Atraumatic, normocephalic with no signs of trauma  Eyes: PERRLA, EOM intact  Ears: TM intact  Nose: Nares patent, mucosa is pink and moist, no polyps noted  Mouth: Mucosa is pink and moist, dention,   Neck: Supple, full ROM, no thyromegaly or masses noted  Resp:  Clear to ascultation bilaterally, no wheezes, rales, or rhonchi noted  CV: Regular rate and rhythm with no murmurs, rubs, or gallops noted  Abdomen: Soft, non-tender, non-distended with normal bowel sounds  Musculoskelatal: ROM full with intact strength, no pain to palpation  Neurologic: Alert and oriented x3, Cranial nerves II-XII grossly intact, DTR normal and symmetric  Pulses: Radial, brachial, carotid, femoral, dorsal pedis, and posterior tibial pulses equal and symmetric     Impression & Recommendations:  Problem # 1:  HYPERTENSION (ICD-401.9) We need to restart a blood pressure medication.  Given his near syncopal episode on the Diovan HCT and his relatively mild elevation today we will start slow at the Diovan 40 mg and have him return to clinic in 2 weeks for a blood pressure check and adjust his medications from there by either increasing the Diovan to 80 mg or possibly adding only 12.5 mg of HCTZ.  He is in agreement with this plan.  His updated medication list for this problem includes:    Diovan 80 Mg Tabs (Valsartan) .Marland Kitchen... Take 1/2 tablet by mouth once per day for blood pressure until your follow up  BP today: 147/87 Prior BP: 138/88 (09/25/2009)  Labs Reviewed: K+: 4.8 (12/05/2009) Creat: : 1.09 (12/05/2009)   Chol: 113 (04/12/2008)   HDL: 45 (04/12/2008)   LDL: 53 (04/12/2008)   TG: 77 (04/12/2008)  Problem # 2:  DIABETES MELLITUS, TYPE II (ICD-250.00)  He is due for a HgA1C at his follow up visit.  He was out of his insulin but will restart on the same schedule when he picks up the medication today.  He is encouraged to continue diet and exercise as well as continuing to take his medications.   His updated medication list for this problem includes:    Glucophage 1000 Mg Tabs (Metformin hcl) .Marland Kitchen... Take 1 tablet by mouth two times a day    Humulin 70/30 70-30 % Susp (Insulin isophane & regular) .Marland Kitchen... Take 40 units in the morning and 20 in the evening.    Diovan 80 Mg Tabs  (Valsartan) .Marland Kitchen... Take 1/2 tablet by mouth once per day for blood pressure until your follow up  Labs Reviewed: Creat: 1.09 (12/05/2009)    Reviewed HgBA1c results: 7.0 (09/05/2009)  6.3 (10/24/2008)  Future Orders: T-Hgb A1C (in-house) [id number]) ... 12/20/2009  Problem # 3:  HYPERLIPIDEMIA (ICD-272.4)  He is due for a FLP which he will do on his follow up appointment in two weeks.  Patient is encouraged to continue diet and exercise as well as medication.  His updated medication list for this problem includes:    Lipitor 20 Mg Tabs (Atorvastatin calcium) .Marland Kitchen... Take 1 tablet by mouth  at bedtime  Labs Reviewed: SGOT: 35 (12/05/2009)   SGPT: 29 (12/05/2009)   HDL:45 (04/12/2008), 44 (04/14/2007)  LDL:53 (04/12/2008), 85 (04/14/2007)  Chol:113 (04/12/2008), 153 (04/14/2007)  Trig:77 (04/12/2008), 120 (04/14/2007)  Problem # 4:  TOBACCO ABUSE (ICD-305.1)  Encouraged smoking cessation and discussed different methods for smoking cessation. Also informed patient that quitting smoking would decrease his need for blood pressure medications as well.  Patient expressed interest in quitting and several methods and ideas for cessation were shared.  We will continue to encourage him and assist as able.  Problem # 5:  MICROCYTIC ANEMIA (ICD-281.9)  This was last checked in 2009 with a GI work up.  We will order a ferritin, TIBC, and Serum Iron and discuss at that time.  Hgb: 13.1 (12/05/2009)   Hct: 38.6 (12/05/2009)   Platelets: 246 (12/05/2009) RBC: 5.16 (12/05/2009)   RDW: 17.3 (12/05/2009)   WBC: 6.5 (12/05/2009) MCV: 74.8 (12/05/2009)   MCHC: 34.0 (12/05/2009) Ferritin: 55 (04/14/2007) TSH: 2.186 (04/14/2007)  Orders: T-Ferritin [id number]) T-Iron Binding Capacity (TIBC) ([id number]) Alric Quan [id number])  Complete Medication List: 1)  Glucophage 1000 Mg Tabs (Metformin hcl) .... Take 1 tablet by mouth two times a day 2)  Lipitor 20 Mg Tabs (Atorvastatin calcium) .... Take 1 tablet  by mouth at bedtime 3)  Viagra 50 Mg Tabs (Sildenafil citrate) .... Take one pill by mouth 1/2 an hour before intercourse 4)  Humulin 70/30 70-30 % Susp (Insulin isophane & regular) .... Take 40 units in the morning and 20 in the evening. 5)  1st Choice Lancets Super Thin Misc (Lancets) 6)  Accusure Insulin Syringe 30g X 5/16" 0.5 Ml Misc (Insulin syringe-needle u-100) 7)  Diovan 80 Mg Tabs (Valsartan) .... Take 1/2 tablet by mouth once per day for blood pressure until your follow up  Patient Instructions: 1)  Restart the Diovan at 40 mg daily. 2)  Return to Clinic in 2 weeks for recheck of blood pressure. 3)  When your return in two weeks please fast for 6 hours.  Do not take the insulin dose before you come in to avoid low blood sugar. 4)  Please call with any questions or concerns.  Prescriptions: DIOVAN 80 MG TABS (VALSARTAN) Take 1/2 tablet by mouth once per day for blood pressure until your follow up  #30 x 3   Entered and Authorized by:   Trish Fountain MD   Signed by:   Trish Fountain MD on 12/07/2009   Method used:   Faxed to ...       [facility] Department (retail)       [place], [place]  [address]       Ph: [phone]       Fax: [phone]   RxID:   [id number]   Prevention & Chronic Care Immunizations   Influenza vaccine: Fluvax Non-MCR  (04/12/2008)   Influenza vaccine deferral: Not available  (12/07/2009)    Tetanus booster: 10/27/2003: Td    Pneumococcal vaccine: Not documented  Colorectal Screening   Hemoccult: Not documented    Colonoscopy:  Results: Polyp, sessile at 30 mm. Location:  [facility] - [name].    (06/12/2007)   Colonoscopy action/deferral: Further recommendations pending biopsy results.    (06/12/2007)  Other Screening   PSA: Not documented   PSA action/deferral: Discussion deferred  (12/07/2009)   Smoking status: current  (12/07/2009)   Smoking cessation counseling: yes   (12/07/2009)  Diabetes Mellitus  HgbA1C: 7.0  (09/05/2009)    Eye exam: Not documented    Foot exam: yes  (04/12/2008)   Foot exam action/deferral: Do today   High risk foot: Not documented   Foot care education: Not documented    Urine microalbumin/creatinine ratio: 24.9  (04/12/2008)    Diabetes flowsheet reviewed?: Yes   Progress toward A1C goal: At goal  Lipids   Total Cholesterol: 113  (04/12/2008)   LDL: 53  (04/12/2008)   LDL Direct: Not documented   HDL: 45  (04/12/2008)   Triglycerides: 77  (04/12/2008)    SGOT (AST): 35  (12/05/2009)   SGPT (ALT): 29  (12/05/2009)   Alkaline phosphatase: 53  (12/05/2009)   Total bilirubin: 0.8  (12/05/2009)    Lipid flowsheet reviewed?: Yes   Progress toward LDL goal: At goal  Hypertension   Last Blood Pressure: 147 / 87  (12/07/2009)   Serum creatinine: 1.09  (12/05/2009)   Serum potassium 4.8  (12/05/2009)    Hypertension flowsheet reviewed?: Yes   Progress toward BP goal: Deteriorated  Self-Management Support :   Personal Goals (by the next clinic visit) :     Personal A1C goal: 7  (09/05/2009)     Personal blood pressure goal: 140/90  (09/05/2009)     Personal LDL goal: 100  (09/05/2009)    Patient will work on the following items until the next clinic visit to reach self-care goals:     Medications and monitoring: take my medicines every day  (12/07/2009)     Eating: drink diet soda or water instead of juice or soda, eat more vegetables, use fresh or frozen vegetables, eat foods that are low in salt, eat baked foods instead of fried foods  (12/07/2009)    Diabetes self-management support: Education handout  (12/07/2009)   Diabetes education handout printed   Last diabetes self-management training by diabetes educator: 08/04/2008    Hypertension self-management support: Education handout  (12/07/2009)   Hypertension education handout printed    Lipid self-management support: Written self-care plan   (09/25/2009)

## 2010-07-03 NOTE — Assessment & Plan Note (Signed)
Summary: RA/ROUTINE CK/VS   Vital Signs:  Patient Profile:   60 Years Old Male Height:     71 inches (180.34 cm) Weight:      252.6 pounds (114.82 kg) BMI:     35.36 Temp:     97.1 degrees F (36.17 degrees C) oral Pulse rate:   79 / minute Pulse (ortho):   82 / minute BP sitting:   126 / 80  (right arm) BP standing:   118 / 83  Pt. in pain?   no  Vitals Entered By: [name] RN (April 12, 2008 9:21 AM)              Is Patient Diabetic? Yes Nutritional Status BMI of > 30 = obese Nutritional Status Detail appetite good CBG Result 389  Have you ever been in a relationship where you felt threatened, hurt or afraid?denies   Does patient need assistance? Functional Status Self care Ambulation Normal     Serial Vital Signs/Assessments:  Time      Position  BP       Pulse  Resp  Temp     By 10AM      Lying RA  124/87   69                    [name] RN 10AM      Sitting   127/87   [name] RN 10AM      Standing  118/83   82                    [name] RN   Visit Type:  office f/u PCP:  Pershing Cox MD  Chief Complaint:  Past 3-6 months tingling both legs and legs weak. Refills on meds. ?dizziness and h/a past 2 weeks.Marland Kitchen  History of Present Illness: 60 year old African American male comes to clinic for follow up. He is known diabetic with HbA1C of 13, hyperlipidemia and hypertension.  He complains of tingling and numbness in bilateral lower extremity. This feeling is ongoing for last few months. He does not complain of pain in leg or sensory loss in leg.  He also complains of dizziness for last few weeks. He relates it to some antacid medication given to him  and its name is not known to him. His symptoms have not reappeared.  He does not complain of polyuria, polydypsia or polyphagia. He has no complain of bowel or bladder disturbance.     Prior Medications Reviewed Using: Patient Recall  Prior Medication List:  GLUCOPHAGE  1000 MG TABS (METFORMIN HCL) Take 1 tablet by mouth two times a day GLIPIZIDE 10 MG TABS (GLIPIZIDE) Take 1 tablet by mouth two times a day LIPITOR 20 MG  TABS (ATORVASTATIN CALCIUM) Take 1 tablet by mouth at bedtime VIAGRA 50 MG  TABS (SILDENAFIL CITRATE) Take one pill by mouth 1/2 an hour before intercourse DIOVAN 80 MG  TABS (VALSARTAN) Take 1 tablet by mouth once a day   Current Allergies (reviewed today): No known allergies   Past Medical History:    Reviewed history from 04/14/2007 and no changes required:       Diabetes mellitus, type II- non insulin dependent            poorly controlled       Hyperlipidemia  Hypertension       Tachycardia:  Sinus,chronic                            ??H/o irregular heart beat per pt       Obstructive sleep apnea (hasn't worn his CPAP since 2004)       H/o Microcytic anemia with heme positve stools 1/06       Ongoing Tobacco abuse       H/o alcohol abuse   Family History:    Father died 33 years of age had diabetes, hypertension and amputations below knee bilateraly secondary to diabetes.    Mother is alive and healthy    Sister has hypertensive    Brother has sleep apnea       Social History:    part time working at [employer]. Lives with wife and kids.    Risk Factors:    Review of Systems      See HPI   Physical Exam  General:     alert, well-developed, well-nourished, and well-hydrated.   Head:     normocephalic, no abnormalities observed, and no abnormalities palpated.   Eyes:     vision blurry, pupils equal, pupils round, and pupils reactive to light.   Ears:     no external deformities.   Nose:     no external deformity, no external erythema, and no nasal discharge.   Mouth:     good dentition.   Neck:     supple, full ROM, and no masses.   Chest Wall:     no deformities and no masses.   Lungs:     normal respiratory effort, no intercostal retractions, no accessory muscle use, no crackles, and no  wheezes.   Heart:     normal rate, regular rhythm, and no murmur.   Abdomen:     central obesity. soft, non-tender, no guarding, no rigidity, no hepatomegaly, and no splenomegaly.   Msk:     normal ROM, no joint tenderness, no joint swelling, and no joint warmth.   Neurologic:     alert & oriented X3, cranial nerves II-XII intact, sensation intact to light touch, and DTRs symmetrical and normal.    Diabetes Management Exam:    Foot Exam (with socks and/or shoes not present):       Sensory-Monofilament:          Left foot: normal          Right foot: normal    Impression & Recommendations:  Problem # 1:  DIABETES MELLITUS, TYPE II (ICD-250.00) Assessment: Deteriorated I had a long discussion with patient about his poor diabetes control. Patient is receptive to starting insulin therapy. He had previously qualified for [employer] country card for drug assistance. However, it has expired and he needs to work with [name] and [name] to reestablish his privileges and get diabetes education. He in past has worked for [employer] and knows how to take insulin and feels safe to do it on his own. Unfortunately, he left the office to attend to his daughter before this visit could be completed. He had indicated to the nurse that he will keep taking his oral diabetes medications until he comes back to see [facility]. I will print the prescriptions and make it available for him when he comes back.   His feet exam was within normal limits. His eye exam is to be scheduled. He does  report some visual disturbance and had visited ophthalmologist 3-4 months back. At the time, his assessment was limited to possible disability application from [employer] and did not particularly focus on diabetic ratinopathy. Patient is unaware of findings on eye exam.  The following medications were removed from the medication list:    Glipizide 10 Mg Tabs (Glipizide) .Marland Kitchen... Take 1 tablet by mouth two times a  day  His updated medication list for this problem includes:    Glucophage 1000 Mg Tabs (Metformin hcl) .Marland Kitchen... Take 1 tablet by mouth two times a day    Diovan 80 Mg Tabs (Valsartan) .Marland Kitchen... Take 1 tablet by mouth once a day    Humulin 70/30 70-30 % Susp (Insulin isophane & regular) .Marland Kitchen... Take 25 units twice daily  Orders: T- Capillary Blood Glucose ([id number]) T-Hgb A1C (in-house) [id number]) T-Urine Microalbumin w/creat. ratio [id number] / [id number]) T-CBC No Diff [id number])  Labs Reviewed: HgBA1c: 13.7 (06/16/2007)   Creat: 1.14 (06/16/2007)   Microalbumin: 3.84 (04/14/2007)   Problem # 2:  HYPERTENSION (ICD-401.9) Assessment: Unchanged Patient is at goal today and has been in the past. No change in medication was done today.  His updated medication list for this problem includes:    Diovan 80 Mg Tabs (Valsartan) .Marland Kitchen... Take 1 tablet by mouth once a day  BP today: 126/80 Prior BP: 120/82 (06/16/2007)  Labs Reviewed: Creat: 1.14 (06/16/2007) Chol: 153 (04/14/2007)   HDL: 44 (04/14/2007)   LDL: 85 (04/14/2007)   TG: 120 (04/14/2007)   Problem # 3:  HYPERLIPIDEMIA (ICD-272.4) Patient was assessed for lipid profile a year ago. He will be re-tested today. No change in medication indicated today.  His updated medication list for this problem includes:    Lipitor 20 Mg Tabs (Atorvastatin calcium) .Marland Kitchen... Take 1 tablet by mouth at bedtime  Labs Reviewed: Chol: 153 (04/14/2007)   HDL: 44 (04/14/2007)   LDL: 85 (04/14/2007)   TG: 120 (04/14/2007) SGOT: 23 (06/16/2007)   SGPT: 25 (06/16/2007)  Orders: T-Lipid Profile [id number])   Problem # 4:  OBSTRUCTIVE SLEEP APNEA (ICD-327.23) Assessment: Unchanged Patient does not have access to CPAP machine. He  plans to work with [name] in coming weeks to work on getting one. At present, he rates his symptoms minimal and does not feel strongly about using CPAP machine.   Problem # 5:  TOBACCO ABUSE (ICD-305.1) Assessment: Unchanged Patient  was re-empahsised side effects of tobacco abuse and offered help if needed to quit. Patient at present not highly receptive of the advice.   Problem # 6:  Preventive Health Care (ICD-V70.0)  Reviewed preventive care protocols, scheduled due services, and updated immunizations.   Complete Medication List: 1)  Glucophage 1000 Mg Tabs (Metformin hcl) .... Take 1 tablet by mouth two times a day 2)  Lipitor 20 Mg Tabs (Atorvastatin calcium) .... Take 1 tablet by mouth at bedtime 3)  Viagra 50 Mg Tabs (Sildenafil citrate) .... Take one pill by mouth 1/2 an hour before intercourse 4)  Diovan 80 Mg Tabs (Valsartan) .... Take 1 tablet by mouth once a day 5)  Humulin 70/30 70-30 % Susp (Insulin isophane & regular) .... Take 25 units twice daily 6)  1st Choice Lancets Super Thin Misc (Lancets) 7)  Accusure Insulin Syringe 30g X 5/16" 0.5 Ml Misc (Insulin syringe-needle u-100)  Other Orders: Influenza Vaccine NON MCR [id number])    Prescriptions: ACCUSURE INSULIN SYRINGE 30G X 5/16" 0.5 ML MISC (INSULIN SYRINGE-NEEDLE U-100)   #60 x 1   Entered  and Authorized by:   Pershing Cox MD   Signed by:   Pershing Cox MD on 04/18/2008   Method used:   Print then Give to Patient   RxID:   [id number] 1ST CHOICE LANCETS [place] (LANCETS)   #100 x 3   Entered and Authorized by:   Pershing Cox MD   Signed by:   Pershing Cox MD on 04/18/2008   Method used:   Print then Give to Patient   RxID:   [id number] HUMULIN 70/30 70-30 % SUSP (INSULIN ISOPHANE & REGULAR) take 25 units twice daily  #1 x 2   Entered and Authorized by:   Pershing Cox MD   Signed by:   Pershing Cox MD on 04/18/2008   Method used:   Print then Give to Patient   RxID:   [id number]  ]  Vital Signs:  Patient Profile:   60 Years Old Male Height:     71 inches (180.34 cm) Weight:      252.6 pounds (114.82 kg) BMI:     35.36 Temp:     97.1 degrees F (36.17 degrees C) oral Pulse rate:   79 / minute Pulse  (ortho):   82 / minute BP sitting:   126 / 80 BP standing:   118 / 83             CBG Result 389      Laboratory Results   Blood Tests   Date/Time Received: April 12, 2008 10:24 AM  Date/Time Reported: Melvia Heaps  April 12, 2008 10:24 AM   HGBA1C: 11.4%   (Normal Range: Non-Diabetic - 3-6%   Control Diabetic - 6-8%) CBG Random:: 389mg /dL       Influenza Vaccine    Vaccine Type: Fluvax Non-MCR    Site: right deltoid    Mfr: GlaxoSmithKline    Dose: 0.5 ml    Route: IM    Given by: [name] RN    Exp. Date: 11/30/2008    Lot #: [id number]    VIS given: 12/25/06 version given April 12, 2008.  Flu Vaccine Consent Questions    Do you have a history of severe allergic reactions to this vaccine? no    Any prior history of allergic reactions to egg and/or gelatin? no    Do you have a sensitivity to the preservative Thimersol? no    Do you have a past history of Guillan-Barre Syndrome? no    Do you currently have an acute febrile illness? no    Have you ever had a severe reaction to latex? no    Vaccine information given and explained to patient? yes    Last LDL:                                                 85 (04/14/2007 10:00:00 PM)          Diabetic Foot Exam Foot Inspection Is there a history of a foot ulcer?              Yes Is there a foot ulcer now?              No Can the patient see the bottom of their feet?          Yes Are the shoes appropriate in style and fit?  Yes Is there swelling or an abnormal foot shape?          No Are the toenails long?                No Are the toenails thick?                No Are the toenails ingrown?              No Is there heavy callous build-up?              No Is there a claw toe deformity?                          No Is there elevated skin temperature?            No Is there limited ankle dorsiflexion?            No Is there foot or ankle muscle weakness?            No Do you have pain  in calf while walking?           No         10-g (5.07) Semmes-Weinstein Monofilament Test Performed by: [name] RN          Right Foot          Left Foot Visual Inspection               Test Control      normal         normal Site 1         normal         normal Site 2         normal         normal Site 3         normal         normal Site 4         normal         normal Site 5         normal         normal Site 6         normal         normal Site 7         normal         normal Site 8         normal         normal Site 9         normal         normal Site 10         normal         normal  Impression      normal         normal  Legend:  Site 1 = Plantar aspect of first toe (center of pad) Site 2 = Plantar aspect of third toe (center of pad) Site 3 = Plantar aspect of fifth toe (center of pad) Site 4 = Plantar aspect of first metatarsal head Site 5 = Plantar aspect of third metatarsal head Site 6 = Plantar aspect of fifth metatarsal head Site 7 = Plantar aspect of medial midfoot Site 8 = Plantar aspect of lateral midfoot Site 9 = Plantar aspect of heel Site 10 = dorsal aspect of foot between the base of the first and second toes   Result is Abnormal if patient  was unable to perceive the monofilament at site indicated.

## 2010-07-03 NOTE — Assessment & Plan Note (Signed)
Summary: f/U PER dR. FITAGERALD/CFB   Vital Signs:  Patient Profile:   60 Years Old Male Height:     71 inches (180.34 cm) Weight:      256.1 pounds (116.41 kg) BMI:     35.85 Temp:     98.5 degrees F (36.94 degrees C) oral Pulse rate:   80 / minute BP sitting:   147 / 90  (right arm)  Pt. in pain?   no  Vitals Entered By: Silverio Decamp NT II (June 29, 2008 3:57 PM)              Is Patient Diabetic? Yes Did you bring your meter with you today? No Nutritional Status BMI of 25 - 29 = overweight CBG Result 265  Does patient need assistance? Functional Status Self care Ambulation Normal     PCP:  Pershing Cox MD  Chief Complaint:  FOLLOW-UP APPOINTMENT.  History of Present Illness: 60 year old African American male comes to clinic for follow up. He is known diabetic with HbA1C of 11.2, hyperlipidemia and hypertension.  During his last visit, I spent considerable time convincing him that going on insulin therapy was best option for him. He had agreed. However, he never followed up with the plan, has lost prescriptions provided and once again remanins interested in insulin therapy. He had feared that his MAP assistance would not cover insulin and diabetic supplies. He has no other complains.     Prior Medication List:  GLUCOPHAGE 1000 MG TABS (METFORMIN HCL) Take 1 tablet by mouth two times a day LIPITOR 20 MG  TABS (ATORVASTATIN CALCIUM) Take 1 tablet by mouth at bedtime VIAGRA 50 MG  TABS (SILDENAFIL CITRATE) Take one pill by mouth 1/2 an hour before intercourse DIOVAN 80 MG  TABS (VALSARTAN) Take 1 tablet by mouth once a day HUMULIN 70/30 70-30 % SUSP (INSULIN ISOPHANE & REGULAR) take 25 units twice daily 1ST CHOICE LANCETS [place]  MISC (LANCETS)  ACCUSURE INSULIN SYRINGE 30G X 5/16" 0.5 ML MISC (INSULIN SYRINGE-NEEDLE U-100)    Current Allergies: No known allergies   Past Medical History:    Reviewed history from 04/14/2007 and no changes required:  Diabetes mellitus, type II- non insulin dependent            poorly controlled       Hyperlipidemia       Hypertension       Tachycardia:  Sinus,chronic                            ??H/o irregular heart beat per pt       Obstructive sleep apnea (hasn't worn his CPAP since 2004)       H/o Microcytic anemia with heme positve stools 1/06       Ongoing Tobacco abuse       H/o alcohol abuse   Family History:    Reviewed history from 04/12/2008 and no changes required:       Father died 82 years of age had diabetes, hypertension and amputations below knee bilateraly secondary to diabetes.       Mother is alive and healthy       Sister has hypertensive       Brother has sleep apnea          Social History:    Reviewed history from 04/12/2008 and no changes required:       part time working at [employer].  Lives with wife and kids.    Risk Factors: Tobacco use:  current    Cigarettes:  Yes -- 1 pack(s) per day  Colonoscopy History:    Date of Last Colonoscopy:  06/12/2007   Review of Systems      See HPI   Physical Exam  General:     alert, well-developed, well-nourished, and well-hydrated.   Head:     normocephalic.   Eyes:     vision grossly intact, pupils equal, pupils round, and pupils reactive to light.   Ears:     no external deformities.   Nose:     no external erythema.   Mouth:     pharynx pink and moist.   Neck:     supple, full ROM, and no masses.   Chest Wall:     no deformities and no tenderness.   Lungs:     normal respiratory effort, no intercostal retractions, no accessory muscle use, normal breath sounds, no dullness, no fremitus, no crackles, and no wheezes.   Heart:     normal rate, regular rhythm, no murmur, no gallop, no rub, no JVD, and no HJR.   Abdomen:     soft, non-tender, normal bowel sounds, no distention, no masses, no guarding, and no rigidity.   Msk:     normal ROM, no joint tenderness, no joint swelling, no joint warmth, and no  redness over joints.   Neurologic:     alert & oriented X3, cranial nerves II-XII intact, strength normal in all extremities, sensation intact to light touch, sensation intact to pinprick, gait normal, and DTRs symmetrical and normal.   Skin:     color normal, no rashes, and no suspicious lesions.   Psych:     Oriented X3, memory intact for recent and remote, normally interactive, good eye contact, and not anxious appearing.      Impression & Recommendations:  Problem # 1:  DIABETES MELLITUS, TYPE II (ICD-250.00) Assessment: Improved Once again today I spent considerable amount of time discussing insulin therapy. Pt is agreeable. He wass provided new prescriptions for the same. Pt will start with 40 units AM and 20 units PM. Pt would also do insulin log. His feet exam was done last time. He is to be scheduled for eye exam.    His updated medication list for this problem includes:    Glucophage 1000 Mg Tabs (Metformin hcl) .Marland Kitchen... Take 1 tablet by mouth two times a day    Diovan 80 Mg Tabs (Valsartan) .Marland Kitchen... Take 1 tablet by mouth once a day    Humulin 70/30 70-30 % Susp (Insulin isophane & regular) .Marland Kitchen... Take 40 units in the morning and 20 in the evening.  Orders: T- Capillary Blood Glucose [id number]) T-Hgb A1C (in-house) [id number])  Labs Reviewed: HgBA1c: 11.4 (04/12/2008)   Creat: 1.14 (06/16/2007)   Microalbumin: 3.79 (04/12/2008)   Problem # 2:  HYPERTENSION (ICD-401.9) Assessment: Deteriorated His BP is not controlled on this occasion. Previously he has been well controlled. We will monitor it closely and add medicine if value above 130/80 persists on next visit.   His updated medication list for this problem includes:    Diovan 80 Mg Tabs (Valsartan) .Marland Kitchen... Take 1 tablet by mouth once a day  BP today: 147/90 Prior BP: 118/83 (04/12/2008)  Labs Reviewed: Creat: 1.14 (06/16/2007) Chol: 113 (04/12/2008)   HDL: 45 (04/12/2008)   LDL: 53 (04/12/2008)   TG: 77 (04/12/2008)    Problem # 3:  HYPERLIPIDEMIA (ICD-272.4)  Assessment: Improved His lipitor is adequete for lipid control.  His updated medication list for this problem includes:    Lipitor 20 Mg Tabs (Atorvastatin calcium) .Marland Kitchen... Take 1 tablet by mouth at bedtime  Labs Reviewed: Chol: 113 (04/12/2008)   HDL: 45 (04/12/2008)   LDL: 53 (04/12/2008)   TG: 77 (04/12/2008) SGOT: 23 (06/16/2007)   SGPT: 25 (06/16/2007)   Problem # 4:  OBSTRUCTIVE SLEEP APNEA (ICD-327.23) Assessment: Unchanged Pt wants to use CPAP. Which I also beleive would be hlepful in controlling his BP. Pt once again has acquired forms for county assistance from [facility]. will follow on next visit.   Problem # 5:  Preventive Health Care (ICD-V70.0) pt needs eye exam that will be scheduled on next visit. he had colonoscopy recently.   Complete Medication List: 1)  Glucophage 1000 Mg Tabs (Metformin hcl) .... Take 1 tablet by mouth two times a day 2)  Lipitor 20 Mg Tabs (Atorvastatin calcium) .... Take 1 tablet by mouth at bedtime 3)  Viagra 50 Mg Tabs (Sildenafil citrate) .... Take one pill by mouth 1/2 an hour before intercourse 4)  Diovan 80 Mg Tabs (Valsartan) .... Take 1 tablet by mouth once a day 5)  Humulin 70/30 70-30 % Susp (Insulin isophane & regular) .... Take 40 units in the morning and 20 in the evening. 6)  1st Choice Lancets Super Thin Misc (Lancets) 7)  Accusure Insulin Syringe 30g X 5/16" 0.5 Ml Misc (Insulin syringe-needle u-100)   Patient Instructions: 1)  Please schedule a follow-up appointment in 2 weeks. 2)  Fill the insulin log 3)  Insulin 40 units in the morning 20 units in the evening. 4)  contact if your blood sugar reads below 100.    Prescriptions: HUMULIN 70/30 70-30 % SUSP (INSULIN ISOPHANE & REGULAR) take 40 units in the morning and 20 in the evening.  #1 x 0   Entered and Authorized by:   Pershing Cox MD   Signed by:   Pershing Cox MD on 06/30/2008   Method used:   Print then Give to Patient    RxID:   [id number] [place] X 5/16" 0.5 ML MISC (INSULIN SYRINGE-NEEDLE U-100)   #60 x 1   Entered and Authorized by:   Pershing Cox MD   Signed by:   Pershing Cox MD on 06/29/2008   Method used:   Print then Give to Patient   RxID:   [id number] 1ST CHOICE LANCETS [place] (LANCETS)   #100 x 3   Entered and Authorized by:   Pershing Cox MD   Signed by:   Pershing Cox MD on 06/29/2008   Method used:   Print then Give to Patient   [id number] HUMULIN 70/30 70-30 % SUSP (INSULIN ISOPHANE & REGULAR) take 25 units twice daily  #1 x 2   Entered and Authorized by:   Pershing Cox MD   Signed by:   Pershing Cox MD on 06/29/2008   Method used:   Print then Give to Patient   RxID:   [id number]   Laboratory Results   Blood Tests   Date/Time Received: June 29, 2008 5:05 PM. Date/Time Reported: Maryan Rued  June 29, 2008 5:05 PM  HGBA1C: 11.2%   (Normal Range: Non-Diabetic - 3-6%   Control Diabetic - 6-8%) CBG Random:: 265mg /dL

## 2010-07-03 NOTE — Letter (Signed)
Summary: Handout Printed  Printed Handout:  - *Patient Instructions 

## 2010-07-03 NOTE — Miscellaneous (Signed)
Summary: vaccine records  vaccine records   Imported By: Garlan Fillers 04/14/2006 18:42:04  _____________________________________________________________________  External Attachment:    Type:   Image     Comment:   External Document

## 2010-07-03 NOTE — Progress Notes (Signed)
Summary: refill/gg  Phone Note Refill Request  on July 26, 2009 4:31 PM  Refills Requested: Medication #1:  DIOVAN 80 MG  TABS Take 1 tablet by mouth once a day   Dosage confirmed as above?Dosage Confirmed   Supply Requested: 3 months   Last Refilled: 04/11/2009  Method Requested: Fax to [facility] Initial call taken by: Gevena Cotton RN,  July 26, 2009 4:32 PM  Follow-up for Phone Call        Please schedule an appointment for [name] with Dr. Manuella Ghazi at the next available non-overbook appointment within the next 3 months.  I will refill enough medication to get him to his follow-up appointment but further refills will require an office visit to assure that the medication and dose are appropriate for him. Follow-up by: Oval Linsey MD,  July 28, 2009 3:48 PM    Prescriptions: DIOVAN 80 MG  TABS (VALSARTAN) Take 1 tablet by mouth once a day  #93 x 3   Entered by:   Burman Freestone MD   Authorized by:   Pershing Cox MD   Signed by:   Burman Freestone MD on 07/28/2009   Method used:   Telephoned to ...       [facility] (retail)       [place], [place]  [address]       Ph: [phone]       Fax: [phone]   RxID:   [id number] DIOVAN 80 MG  TABS (VALSARTAN) Take 1 tablet by mouth once a day  #30 x 3   Entered and Authorized by:   Oval Linsey MD   Signed by:   Oval Linsey MD on 07/28/2009   Method used:   Faxed to ...       [facility] (retail)       [address] [place], [place]  [address]       Ph: [phone]       Fax: [phone]   RxID:   [id number]

## 2010-07-03 NOTE — Letter (Signed)
Summary: Appt Reminder [facility]  [place], [place] [address]   Phone: [phone]  Fax: [phone]        January 17, 2010 MRN: [id number]    [facility] [address] [place], [place]  [address]    Dear Mr. Deriso,   You have a return appointment with Dr.Robert [name] on 02-26-10 at 2:30pm. Please remember to bring a complete list of the medicines you are taking, your insurance card and your co-pay.  If you have to cancel or reschedule this appointment, please call before 5:00 pm the evening before to avoid a cancellation fee.  If you have any questions or concerns, please call [phone].    Sincerely,    Vivia Ewing LPN  Appended Document: Appt Reminder 2 Letter mailed to patient.

## 2010-07-03 NOTE — Progress Notes (Signed)
Summary: Aciphex request from pharmacy   Phone Note Refill Request Message from:  Pharmacy on July 06, 2008 1:54 PM  Denied  Aciphex refills  Pt needs office appointment, for Aciphex last OV 2008    Method Requested: Fax to [place] Initial call taken by: Genella Mech CMA,  July 06, 2008 1:55 PM

## 2010-07-03 NOTE — Letter (Signed)
Summary: [facility] Instructions  [facility]  [place], [place] [address]   Phone: [phone]  Fax: [phone]       NICHOLE [name]    60/09/1950    MRN: [id number]        Procedure Day Sudie Grumbling:  Adelfa Koh  01/15/10     Arrival Time:  7:30AM      Procedure Time:  8:30AM     Location of Procedure:                    _ X_  [facility] (Out Patient Registration)   [place]   Starting 5 days prior to your procedure 01/10/10 do not eat nuts, seeds, popcorn, corn, beans, peas,  salads, or any raw vegetables.  Do not take any fiber supplements (e.g. Metamucil, Citrucel, and Benefiber).  THE DAY BEFORE YOUR PROCEDURE         DATE: 01/14/10  DAY: SUNDAY  1.  Drink clear liquids the entire day-NO SOLID FOOD  2.  Do not drink anything colored red or purple.  Avoid juices with pulp.  No orange juice.  3.  Drink at least 64 oz. (8 glasses) of fluid/clear liquids during the day to prevent dehydration and help the prep work efficiently.  CLEAR LIQUIDS INCLUDE: Water Jello Ice Popsicles Tea (sugar ok, no milk/cream) Powdered fruit flavored drinks Coffee (sugar ok, no milk/cream) Gatorade Juice: apple, white grape, white cranberry  Lemonade Clear bullion, consomm, broth Carbonated beverages (any kind) Strained chicken noodle soup Hard Candy                             4.  In the morning, mix first dose of MoviPrep solution:    Empty 1 Pouch A and 1 Pouch B into the disposable container    Add lukewarm drinking water to the top line of the container. Mix to dissolve    Refrigerate (mixed solution should be used within 24 hrs)  5.  Begin drinking the prep at 5:00 p.m. The MoviPrep container is divided by 4 marks.   Every 15 minutes drink the solution down to the next mark (approximately 8 oz) until the full liter is complete.   6.  Follow completed prep with 16 oz of clear liquid of your choice (Nothing red or  purple).  Continue to drink clear liquids until bedtime.  7.  Before going to bed, mix second dose of MoviPrep solution:    Empty 1 Pouch A and 1 Pouch B into the disposable container    Add lukewarm drinking water to the top line of the container. Mix to dissolve    Refrigerate  THE DAY OF YOUR PROCEDURE      DATE: 01/15/10  DAY: MONDAY  Beginning at 3:30AM (5 hours before procedure):         1. Every 15 minutes, drink the solution down to the next mark (approx 8 oz) until the full liter is complete.  2. Follow completed prep with 16 oz. of clear liquid of your choice.    3. You may drink clear liquids until 4:30AM (4 HOURS BEFORE PROCEDURE).   MEDICATION INSTRUCTIONS  Unless otherwise instructed, you should take regular prescription medications with a small sip of water   as early as possible the morning of your procedure.  Diabetic patients - see separate instructions.         OTHER  INSTRUCTIONS  You will need a responsible adult at least 60 years of age to accompany you and drive you home.   This person must remain in the waiting room during your procedure.  Wear loose fitting clothing that is easily removed.  Leave jewelry and other valuables at home.  However, you may wish to bring a book to read or  an iPod/MP3 player to listen to music as you wait for your procedure to start.  Remove all body piercing jewelry and leave at home.  Total time from sign-in until discharge is approximately 2-3 hours.  You should go home directly after your procedure and rest.  You can resume normal activities the  day after your procedure.  The day of your procedure you should not:   Drive   Make legal decisions   Operate machinery   Drink alcohol   Return to work  You will receive specific instructions about eating, activities and medications before you leave.    The above instructions have been reviewed and explained to me by   Randall Hiss RN  January 09, 2010 10:28 AM     I fully understand and can verbalize these instructions _____________________________ Date _________

## 2010-07-03 NOTE — Procedures (Signed)
Summary: Medication List/[facility]   Medication List/[facility]   Imported By: Phillis Knack 01/11/2010 09:40:21  _____________________________________________________________________  External Attachment:    Type:   Image     Comment:   External Document

## 2010-07-03 NOTE — Consult Note (Signed)
Summary: [name]   Imported By: Lacy Duverney 03/08/2010 10:30:43  _____________________________________________________________________  External Attachment:    Type:   Image     Comment:   External Document

## 2010-07-03 NOTE — Progress Notes (Signed)
Summary: Medication/gh  Phone Note Call from Patient   Caller: Patient Call For: Pershing Cox MD Summary of Call: Call from pt says that his medication is making him feel swimmy headed.  Taking the 1/2 pill a day.  Dizziness and is about to fall.  Cannot take this medication.  Has an appointment on the 5th.  Started having the problem when he started taking the 160/25 takes 1/2 tablet a day. Was on plain Diovan 160.  Never took the HCTZ until last week.  Can be reached [phone].  Pt to hold medication until he gets the ok from the Clinics. Sander Nephew RN  November 28, 2009 9:18 AM  Initial call taken by: Sander Nephew RN,  November 28, 2009 9:16 AM  Follow-up for Phone Call        Have him come in for labs and if they show anything urgent we will see him-otherwise schedule for next week. Orders are entered. Follow-up by: Rhea Pink  DO,  November 28, 2009 12:21 PM  Additional Follow-up for Phone Call Additional follow up Details #1::        RTC to pt cannot come in today for labs.  Will come in on Thursday am.  Has to work on Wednesday.  Pt scheduled for a lab only appointment on Thursday am. Additional Follow-up by: Sander Nephew RN,  November 28, 2009 3:54 PM  New Problems: DIZZINESS (ICD-780.4)   Additional Follow-up for Phone Call Additional follow up Details #2::    Thanks [facility] for following up.  Follow-up by: Pershing Cox MD,  November 29, 2009 4:24 AM  New Problems: DIZZINESS (ICD-780.4)

## 2010-07-03 NOTE — Progress Notes (Signed)
Summary: Refill/gh  Phone Note Refill Request Message from:  Fax from Pharmacy  Refill on Aciphex 20 mg 1 tablet by mouth # 90 tablets .  [facility] Department   Method Requested: Fax to [place] Initial call taken by: Sander Nephew RN,  August 16, 2008 10:14 AM  Follow-up for Phone Call        Rx denied because Aciphex is not on the pts med list and Dr. Kelby Fam office denied it as well stating he needs to make an appt with them first to get a RF. Follow-up by: Rico Sheehan DO,  August 16, 2008 10:23 AM  Additional Follow-up for Phone Call Additional follow up Details #1::        Rx denial called/faxed to pharmacy Additional Follow-up by: Sander Nephew RN,  August 16, 2008 10:46 AM

## 2010-07-03 NOTE — Progress Notes (Signed)
Summary: refill/ hla  Phone Note Refill Request Message from:  Fax from Pharmacy on February 19, 2008 11:26 AM  Refills Requested: Medication #1:  GLIPIZIDE 10 MG TABS Take 1 tablet by mouth two times a day   Last Refilled: 6/11 Initial call taken by: Freddy Finner RN,  February 19, 2008 11:26 AM  Additional Follow-up for Phone Call Additional follow up Details #1::        Rx faxed to pharmacy Additional Follow-up by: Freddy Finner RN,  February 19, 2008 4:27 PM      Prescriptions: GLIPIZIDE 10 MG TABS (GLIPIZIDE) Take 1 tablet by mouth two times a day  #60 x 11   Entered and Authorized by:   Burton Apley MD   Signed by:   Burton Apley MD on 02/19/2008   Method used:   Telephoned to ...       [facility] Department (retail)       [address] [place], [place]  [address]       Ph: [phone]       Fax: [phone]   RxID:   [id number]

## 2010-07-03 NOTE — Progress Notes (Signed)
Summary: refill/gg  Phone Note Refill Request  on August 12, 2007 12:08 PM  Refill on lisinopril 5 mg   Last refill 07/14/07  **** is pt supposed to take both this and diovan??  need clarification.  I see she was changed back to diovan BUT got this refilled 07/14/07  Initial call taken by: Gevena Cotton RN,  August 12, 2007 12:14 PM  Follow-up for Phone Call        I have never seen this patient. I suppose he is supposed to take Diovan and no lisinopril so will not refill. However, if he can't afford diovan, will need to use lisinopril. Follow-up by: Burton Apley MD,  August 12, 2007 3:49 PM

## 2010-07-03 NOTE — Assessment & Plan Note (Signed)
Summary: ACUTE/SHAH/F/U VISIT/CH   Vital Signs:  Patient profile:   60 year old male Height:      71 inches (180.34 cm) Weight:      266.5 pounds (121.14 kg) BMI:     37.30 Temp:     98.2 degrees F (36.78 degrees C) oral Pulse rate:   71 / minute BP sitting:   138 / 88  (right arm) Cuff size:   large  Vitals Entered By: Lucky Rathke NT II (September 25, 2009 9:31 AM) CC: PATIENT STATES HE IS HERE FOR FOLLOW FROM LAST APPT. - MEDICATION CHANGE- DID NOT GET THE MED Is Patient Diabetic? Yes Did you bring your meter with you today? SYSTEM DOWN Pain Assessment Patient in pain? no      Nutritional Status BMI of > 30 = obese  Have you ever been in a relationship where you felt threatened, hurt or afraid?No   Does patient need assistance? Functional Status Self care Ambulation Normal Comments PATIENT STATES HE IS HERE FOR FOLLOW UP APPT . -MEDICATION CHANGE / DID NOT GET THE MEDICATION   Primary Care Provider:  Pershing Cox MD  CC:  PATIENT STATES HE IS HERE FOR FOLLOW FROM LAST APPT. - MEDICATION CHANGE- DID NOT GET THE MED.  History of Present Illness: 60 yr old with PMH of DM, HTN and HLD came here regular f/u. He has no c/o including SOB, CP and has been taking his meds except HCTZ because he has not got it. He check his CBG once every 1-2 weeks because he does not like it because of the pain. Denies any melena ordysuria. Current smoker 1 PPD, occasional ETOH, no drugs.   Preventive Screening-Counseling & Management  Alcohol-Tobacco     Smoking Status: current     Smoking Cessation Counseling: yes     Packs/Day: 1  Caffeine-Diet-Exercise     Does Patient Exercise: yes     Type of exercise: WALK     Exercise (avg: min/session): 1HOUR     Times/week: 3-4  Problems Prior to Update: 1)  Tinea Corporis  (ICD-110.5) 2)  Skin Rash  (ICD-782.1) 3)  Diabetic Hypoglycemia, Type II  (ICD-250.80) 4)  Diabetes Mellitus, Type II  (ICD-250.00) 5)  Hypertension  (ICD-401.9) 6)   Hyperlipidemia  (ICD-272.4) 7)  Microcytic Anemia  (ICD-281.9) 8)  Tobacco Abuse  (ICD-305.1) 9)  Obstructive Sleep Apnea  (ICD-327.23)  Medications Prior to Update: 1)  Glucophage 1000 Mg Tabs (Metformin Hcl) .... Take 1 Tablet By Mouth Two Times A Day 2)  Lipitor 20 Mg  Tabs (Atorvastatin Calcium) .... Take 1 Tablet By Mouth At Bedtime 3)  Viagra 50 Mg  Tabs (Sildenafil Citrate) .... Take One Pill By Mouth 1/2 An Hour Before Intercourse 4)  Diovan 80 Mg  Tabs (Valsartan) .... Take 1 Tablet By Mouth Once A Day 5)  Humulin 70/30 70-30 % Susp (Insulin Isophane & Regular) .... Take 40 Units in The Morning and 20 in The Evening. 6)  1st Choice Lancets Super Thin  Misc (Lancets) 7)  Accusure Insulin Syringe 30g X 5/16" 0.5 Ml Misc (Insulin Syringe-Needle U-100) 8)  Hydrochlorothiazide 25 Mg Tabs (Hydrochlorothiazide) .... Take 1 Tablet By Mouth Once A Day 9)  Fluconazole 150 Mg Tabs (Fluconazole) .... Take One Tab Every Week For Next Four Weeks.  Current Medications (verified): 1)  Glucophage 1000 Mg Tabs (Metformin Hcl) .... Take 1 Tablet By Mouth Two Times A Day 2)  Lipitor 20 Mg  Tabs (Atorvastatin Calcium) .Marland KitchenMarland KitchenMarland Kitchen  Take 1 Tablet By Mouth At Bedtime 3)  Viagra 50 Mg  Tabs (Sildenafil Citrate) .... Take One Pill By Mouth 1/2 An Hour Before Intercourse 4)  Diovan 80 Mg  Tabs (Valsartan) .... Take 1 Tablet By Mouth Once A Day 5)  Humulin 70/30 70-30 % Susp (Insulin Isophane & Regular) .... Take 40 Units in The Morning and 20 in The Evening. 6)  1st Choice Lancets Super Thin  Misc (Lancets) 7)  Accusure Insulin Syringe 30g X 5/16" 0.5 Ml Misc (Insulin Syringe-Needle U-100) 8)  Hydrochlorothiazide 25 Mg Tabs (Hydrochlorothiazide) .... Take 1 Tablet By Mouth Once A Day 9)  Fluconazole 150 Mg Tabs (Fluconazole) .... Take One Tab Every Week For Next Four Weeks.  Allergies (verified): No Known Drug Allergies  Past History:  Past Medical History: Last updated: 09/05/2009 Diabetes mellitus, type  II-  insulin dependent Hyperlipidemia Hypertension Tachycardia:  Sinus,chronic                      ??H/o irregular heart beat per pt Obstructive sleep apnea (hasn't worn his CPAP since 2004) H/o Microcytic anemia with heme positve stools 1/06 Ongoing Tobacco abuse H/o alcohol abuse  Family History: Last updated: 04-22-2008 Father died 14 years of age had diabetes, hypertension and amputations below knee bilateraly secondary to diabetes. Mother is alive and healthy Sister has hypertensive Brother has sleep apnea    Social History: Last updated: 04/22/08 part time working at [employer]. Lives with wife and kids.   Risk Factors: Exercise: yes (09/25/2009)  Risk Factors: Smoking Status: current (09/25/2009) Packs/Day: 1 (09/25/2009)  Family History: Reviewed history from 2008/04/22 and no changes required. Father died 37 years of age had diabetes, hypertension and amputations below knee bilateraly secondary to diabetes. Mother is alive and healthy Sister has hypertensive Brother has sleep apnea    Social History: Reviewed history from 04/22/2008 and no changes required. part time working at [employer]. Lives with wife and kids. Does Patient Exercise:  yes  Review of Systems  The patient denies anorexia, fever, decreased hearing, chest pain, syncope, dyspnea on exertion, peripheral edema, prolonged cough, abdominal pain, melena, and transient blindness.    Physical Exam  General:  alert, well-developed, well-nourished, well-hydrated, and overweight-appearing.   Ears:  no external deformities.   Nose:  no nasal discharge.   Mouth:  pharynx pink and moist.   Neck:  supple.   Lungs:  normal respiratory effort, normal breath sounds, no crackles, and no wheezes.   Heart:  normal rate, regular rhythm, no murmur, no gallop, no rub, and no JVD.   Abdomen:  soft, non-tender, normal bowel sounds, no distention, no masses, and no guarding.   Msk:  normal ROM, no joint  tenderness, and no joint swelling.   Pulses:  2+ Extremities:  No edema. Neurologic:  alert & oriented X3, cranial nerves II-XII intact, strength normal in all extremities, sensation intact to light touch, and gait normal.     Impression & Recommendations:  Problem # 1:  DIABETES MELLITUS, TYPE II (ICD-250.00) Assessment Unchanged His recent A1C 7 and his CBG usually runs 120s, but not check them regualrly. Advised weight loss, exercise and check his CBG twice a week at least.   His updated medication list for this problem includes:    Glucophage 1000 Mg Tabs (Metformin hcl) .Marland Kitchen... Take 1 tablet by mouth two times a day    Diovan 80 Mg Tabs (Valsartan) .Marland Kitchen... Take 1 tablet by mouth once a day  Humulin 70/30 70-30 % Susp (Insulin isophane & regular) .Marland Kitchen... Take 40 units in the morning and 20 in the evening.  Labs Reviewed: Creat: 1.15 (09/05/2009)    Reviewed HgBA1c results: 7.0 (09/05/2009)  6.3 (10/24/2008)  Problem # 2:  HYPERTENSION (ICD-401.9) Assessment: Unchanged His BP well controlled and will continue to take current meds and give him the prescription of HCTZ so he can get it at [facility] for $4 program. Will recheck at next visit.   His updated medication list for this problem includes:    Diovan 80 Mg Tabs (Valsartan) .Marland Kitchen... Take 1 tablet by mouth once a day    Hydrochlorothiazide 25 Mg Tabs (Hydrochlorothiazide) .Marland Kitchen... Take 1 tablet by mouth once a day  BP today: 138/88 Prior BP: 140/86 (09/05/2009)  Labs Reviewed: K+: 4.4 (09/05/2009) Creat: : 1.15 (09/05/2009)   Chol: 113 (04/12/2008)   HDL: 45 (04/12/2008)   LDL: 53 (04/12/2008)   TG: 77 (04/12/2008)  Problem # 3:  TOBACCO ABUSE (ICD-305.1) Assessment: Unchanged He still smokes 1 PPD and advised him to cut for now and he would like to consider it. Encouraged smoking cessation and discussed different methods for smoking cessation.   Complete Medication List: 1)  Glucophage 1000 Mg Tabs (Metformin hcl) .... Take 1  tablet by mouth two times a day 2)  Lipitor 20 Mg Tabs (Atorvastatin calcium) .... Take 1 tablet by mouth at bedtime 3)  Viagra 50 Mg Tabs (Sildenafil citrate) .... Take one pill by mouth 1/2 an hour before intercourse 4)  Diovan 80 Mg Tabs (Valsartan) .... Take 1 tablet by mouth once a day 5)  Humulin 70/30 70-30 % Susp (Insulin isophane & regular) .... Take 40 units in the morning and 20 in the evening. 6)  1st Choice Lancets Super Thin Misc (Lancets) 7)  Accusure Insulin Syringe 30g X 5/16" 0.5 Ml Misc (Insulin syringe-needle u-100) 8)  Hydrochlorothiazide 25 Mg Tabs (Hydrochlorothiazide) .... Take 1 tablet by mouth once a day 9)  Fluconazole 150 Mg Tabs (Fluconazole) .... Take one tab every week for next four weeks.  Patient Instructions: 1)  Please schedule a follow-up appointment in 4-6 months. 2)  Tobacco is very bad for your health and your loved ones! You Should stop smoking!. 3)  Stop Smoking Tips: Choose a Quit date. Cut down before the Quit date. decide what you will do as a substitute when you feel the urge to smoke(gum,toothpick,exercise). 4)  It is important that you exercise regularly at least 20 minutes 5 times a week. If you develop chest pain, have severe difficulty breathing, or feel very tired , stop exercising immediately and seek medical attention. 5)  You need to lose weight. Consider a lower calorie diet and regular exercise.  6)  Check your blood sugars regularly. If your readings are usually above : or below 70 you should contact our office. 7)  Check your feet each night for sore areas, calluses or signs of infection. 8)  Check your Blood Pressure regularly. If it is above: you should make an appointment. Prescriptions: HYDROCHLOROTHIAZIDE 25 MG TABS (HYDROCHLOROTHIAZIDE) Take 1 tablet by mouth once a day  #31 x 3   Entered and Authorized by:   Geanie Kenning MD   Signed by:   Geanie Kenning MD on 09/25/2009   Method used:   Print then Give to Patient   RxID:    [id number]    Prevention & Chronic Care Immunizations   Influenza vaccine: Fluvax Non-MCR  (04/12/2008)   Influenza vaccine  deferral: Deferred  (09/05/2009)    Tetanus booster: 10/27/2003: Td    Pneumococcal vaccine: Not documented  Colorectal Screening   Hemoccult: Not documented    Colonoscopy:  Results: Polyp, sessile at 30 mm. Location:  [facility] - [name].    (06/12/2007)   Colonoscopy action/deferral: Further recommendations pending biopsy results.    (06/12/2007)  Other Screening   PSA: Not documented   Smoking status: current  (09/25/2009)   Smoking cessation counseling: yes  (09/25/2009)  Diabetes Mellitus   HgbA1C: 7.0  (09/05/2009)    Eye exam: Not documented    Foot exam: yes  (04/12/2008)   Foot exam action/deferral: Do today   High risk foot: Not documented   Foot care education: Not documented    Urine microalbumin/creatinine ratio: 24.9  (04/12/2008)    Diabetes flowsheet reviewed?: Yes   Progress toward A1C goal: At goal  Lipids   Total Cholesterol: 113  (04/12/2008)   LDL: 53  (04/12/2008)   LDL Direct: Not documented   HDL: 45  (04/12/2008)   Triglycerides: 77  (04/12/2008)    SGOT (AST): 37  (07/15/2008)   SGPT (ALT): 28  (07/15/2008)   Alkaline phosphatase: 71  (07/15/2008)   Total bilirubin: 0.6  (07/15/2008)    Lipid flowsheet reviewed?: Yes   Progress toward LDL goal: At goal  Hypertension   Last Blood Pressure: 138 / 88  (09/25/2009)   Serum creatinine: 1.15  (09/05/2009)   Serum potassium 4.4  (09/05/2009)    Hypertension flowsheet reviewed?: Yes   Progress toward BP goal: Unchanged  Self-Management Support :   Personal Goals (by the next clinic visit) :     Personal A1C goal: 7  (09/05/2009)     Personal blood pressure goal: 140/90  (09/05/2009)     Personal LDL goal: 100  (09/05/2009)    Patient will work on the following items until the next clinic visit to reach self-care goals:     Medications and  monitoring: take my medicines every day, check my blood sugar, weigh myself weekly  (09/25/2009)     Eating: drink diet soda or water instead of juice or soda, eat more vegetables  (09/25/2009)    Diabetes self-management support: Written self-care plan  (09/25/2009)   Diabetes care plan printed   Last diabetes self-management training by diabetes educator: 08/04/2008    Hypertension self-management support: Written self-care plan  (09/25/2009)   Hypertension self-care plan printed.    Lipid self-management support: Written self-care plan  (09/25/2009)   Lipid self-care plan printed.  Appended Document: ACUTE/SHAH/F/U VISIT/CH BLOOD SUGAR THIS VISIT= 89

## 2010-07-03 NOTE — Progress Notes (Signed)
Summary: refill/ hla  Phone Note Refill Request Message from:  Pharmacy on December 05, 2009 10:47 AM  Refills Requested: Medication #1:  novolin 70/30 Initial call taken by: Freddy Finner RN,  December 05, 2009 10:48 AM    Prescriptions: HUMULIN 70/30 70-30 % SUSP (INSULIN ISOPHANE & REGULAR) take 40 units in the morning and 20 in the evening.  #6 vials x 11   Entered and Authorized by:   Pershing Cox MD   Signed by:   Pershing Cox MD on 12/05/2009   Method used:   Faxed to ...       [facility] Department (retail)       [address] [place], [place]  [address]       Ph: [phone]       Fax: [phone]   RxID:   [id number]

## 2010-07-03 NOTE — Assessment & Plan Note (Signed)
Summary: EST-CK/FU/MEDS/CFB   Vital Signs:  Patient profile:   60 year old male Height:      71 inches Weight:      268.6 pounds BMI:     37.60 Temp:     98.1 degrees F oral Pulse rate:   73 / minute BP sitting:   140 / 86  (right arm)  Vitals Entered By: Silverio Decamp NT II (September 05, 2009 3:35 PM) CC: CHECKUP NEED REFILLS Is Patient Diabetic? Yes Did you bring your meter with you today? No Pain Assessment Patient in pain? no      Nutritional Status BMI of > 30 = obese CBG Result 50  Does patient need assistance? Functional Status Self care Ambulation Normal    Primary Care Provider:  Pershing Cox MD  CC:  CHECKUP NEED REFILLS.  History of Present Illness: 60 yr old is here for visit,after not seeing [place] for a year. He wanted to continue meds with refill requests but he needed to be seen in the clinic for me to do so. He reports he is doing fine. He does not haveh is meter with him because he was in hurry to come to the clinic. He is out of some of his meds. He otherwise doing fine.  he reports some skin lesion in lower leg that are there since last summer. They started when he went on fishing trip. He wonders if he needs antibiotics for the same. He used to itch there but does not have much symptoms now. He goes to fishing from wooded area and has witnesses some bugs sticking to his legs. He does not know if that was tick.   Preventive Screening-Counseling & Management  Alcohol-Tobacco     Smoking Status: current     Packs/Day: 1  Allergies (verified): No Known Drug Allergies  Past History:  Family History: Last updated: 05-08-08 Father died 33 years of age had diabetes, hypertension and amputations below knee bilateraly secondary to diabetes. Mother is alive and healthy Sister has hypertensive Brother has sleep apnea    Social History: Last updated: 08-May-2008 part time working at [employer]. Lives with wife and kids.   Risk Factors: Smoking Status:  current (09/05/2009) Packs/Day: 1 (09/05/2009)  Past Medical History: Diabetes mellitus, type II-  insulin dependent Hyperlipidemia Hypertension Tachycardia:  Sinus,chronic                      ??H/o irregular heart beat per pt Obstructive sleep apnea (hasn't worn his CPAP since 2004) H/o Microcytic anemia with heme positve stools 1/06 Ongoing Tobacco abuse H/o alcohol abuse  Review of Systems      See HPI  Physical Exam  General:  Well-developed,well-nourished,in no acute distress; alert,appropriate and cooperative throughout examination Head:  normocephalic.   Eyes:  vision grossly intact, pupils equal, pupils round, and pupils reactive to light.   Ears:  no external deformities.   Nose:  no external erythema.   Mouth:  pharynx pink and moist.  poor dentition with multiple teeth missing Neck:  supple, full ROM, and no masses.   Lungs:  Normal respiratory effort, chest expands symmetrically. Lungs are clear to auscultation, no crackles or wheezes. Abdomen:  Bowel sounds positive,abdomen soft and non-tender without masses, organomegaly or hernias noted. Msk:  normal ROM.   Extremities:  about 3-5 cm size round dark pigmented lesions over lower extremities. there are total of four such demarcated area on both legs. They do not have discoloration  variation. Their edges are regular. No significant edema or itching in the sorrounding area.  Neurologic:  alert & oriented X3, cranial nerves II-XII intact, and sensation intact to light touch.     Impression & Recommendations:  Problem # 1:  DIABETES MELLITUS, TYPE II (ICD-250.00) A1C has deteroiorated but still at goal. He was out of his meds and this might have contributed to his higher A1C score. We discussed at length how we have to be vigilant with diabetes. He voices understanding. No changes made in the regimen at present.  His updated medication list for this problem includes:    Glucophage 1000 Mg Tabs (Metformin hcl) .Marland Kitchen...  Take 1 tablet by mouth two times a day    Diovan 80 Mg Tabs (Valsartan) .Marland Kitchen... Take 1 tablet by mouth once a day    Humulin 70/30 70-30 % Susp (Insulin isophane & regular) .Marland Kitchen... Take 40 units in the morning and 20 in the evening.  Labs Reviewed: Creat: 1.14 (07/15/2008)    Reviewed HgBA1c results: 7.0 (09/05/2009)  6.3 (10/24/2008)  Problem # 2:  HYPERTENSION (ICD-401.9) BP elevated from his baseline yet at goal. Continued on same meds. Will recheck BMET given that his K was borderline high on last check.  His updated medication list for this problem includes:    Diovan 80 Mg Tabs (Valsartan) .Marland Kitchen... Take 1 tablet by mouth once a day    Hydrochlorothiazide 25 Mg Tabs (Hydrochlorothiazide) .Marland Kitchen... Take 1 tablet by mouth once a day  BP today: 140/86 Prior BP: 132/84 (10/24/2008)  Labs Reviewed: K+: 5.0 (07/15/2008) Creat: : 1.14 (07/15/2008)   Chol: 113 (04/12/2008)   HDL: 45 (04/12/2008)   LDL: 53 (04/12/2008)   TG: 77 (04/12/2008)  Orders: T-Basic Metabolic Panel (99991111) T-CBC w/Diff [phone])  Problem # 3:  HYPERLIPIDEMIA (ICD-272.4) He is due to fasting lipi panel check but he is not ready for it yet. He will schedule this on next vist.  His updated medication list for this problem includes:    Lipitor 20 Mg Tabs (Atorvastatin calcium) .Marland Kitchen... Take 1 tablet by mouth at bedtime  Labs Reviewed: SGOT: 37 (07/15/2008)   SGPT: 28 (07/15/2008)   HDL:45 (04/12/2008), 44 (04/14/2007)  LDL:53 (04/12/2008), 85 (04/14/2007)  Chol:113 (04/12/2008), 153 (04/14/2007)  Trig:77 (04/12/2008), 120 (04/14/2007)  Problem # 4:  TOBACCO ABUSE (ICD-305.1) He says he has cut down but he is not ready to quit.   Problem # 5:  TINEA CORPORIS (ICD-110.5) Pt bilateral lower extremity lesions resemble tinea infection. He does not have central clearing but does have clear round edeges. He had itching and exposure to wetness at the time of fishing. I will start him on fluconazole once weekly for four  weeks. We will then reexamine it and decide if it needs scrapping and/or biopsy for further work up. I have advised him on keeping the are dry.   Problem # 6:  DIABETIC HYPOGLYCEMIA, TYPE II (ICD-250.80) today since he was coming to the doctors, he decided not to eat. That way his sugar will be low and he can "impress" his doctors. However, he was hypoglycemic when he came here since he did take insulin but did not eat. He was given orange juice and felt better immediately aftewards. I advised him to avoid such risky behavior. He voices understanding.  His updated medication list for this problem includes:    Glucophage 1000 Mg Tabs (Metformin hcl) .Marland Kitchen... Take 1 tablet by mouth two times a day    Diovan 80 Mg  Tabs (Valsartan) .Marland Kitchen... Take 1 tablet by mouth once a day    Humulin 70/30 70-30 % Susp (Insulin isophane & regular) .Marland Kitchen... Take 40 units in the morning and 20 in the evening.  Complete Medication List: 1)  Glucophage 1000 Mg Tabs (Metformin hcl) .... Take 1 tablet by mouth two times a day 2)  Lipitor 20 Mg Tabs (Atorvastatin calcium) .... Take 1 tablet by mouth at bedtime 3)  Viagra 50 Mg Tabs (Sildenafil citrate) .... Take one pill by mouth 1/2 an hour before intercourse 4)  Diovan 80 Mg Tabs (Valsartan) .... Take 1 tablet by mouth once a day 5)  Humulin 70/30 70-30 % Susp (Insulin isophane & regular) .... Take 40 units in the morning and 20 in the evening. 6)  1st Choice Lancets Super Thin Misc (Lancets) 7)  Accusure Insulin Syringe 30g X 5/16" 0.5 Ml Misc (Insulin syringe-needle u-100) 8)  Hydrochlorothiazide 25 Mg Tabs (Hydrochlorothiazide) .... Take 1 tablet by mouth once a day 9)  Fluconazole 150 Mg Tabs (Fluconazole) .... Take one tab every week for next four weeks.  Other Orders: T- Capillary Blood Glucose (82948) T-Hgb A1C (in-house) [id number])  Patient Instructions: 1)  Please schedule a follow-up appointment in 2 weeks. Prescriptions: HYDROCHLOROTHIAZIDE 25 MG TABS  (HYDROCHLOROTHIAZIDE) Take 1 tablet by mouth once a day  #31 x 3   Entered and Authorized by:   Pershing Cox MD   Signed by:   Pershing Cox MD on 09/05/2009   Method used:   Print then Give to Patient   RxID:   [id number] GLUCOPHAGE 1000 MG TABS (METFORMIN HCL) Take 1 tablet by mouth two times a day  #60 x 3   Entered and Authorized by:   Pershing Cox MD   Signed by:   Pershing Cox MD on 09/05/2009   Method used:   Print then Give to Patient   [id number] FLUCONAZOLE 150 MG TABS (FLUCONAZOLE) Take one tab every week for next four weeks.  #4 x 0   Entered and Authorized by:   Pershing Cox MD   Signed by:   Pershing Cox MD on 09/05/2009   Method used:   Print then Give to Patient   RxID:   [id number]   Laboratory Results   Blood Tests   Date/Time Received: September 05, 2009 3:51 PM  Date/Time Reported: Lenoria Farrier  September 05, 2009 3:51 PM   HGBA1C: 7.0%   (Normal Range: Non-Diabetic - 3-6%   Control Diabetic - 6-8%) CBG Random:: 50mg /dL  Comments: Results of CBG given to [facility] NT by [facility] at 1545pm 09-05-09 Lenoria Farrier  September 05, 2009 3:51 PM     Prevention & Chronic Care Immunizations   Influenza vaccine: Fluvax Non-MCR  (04/12/2008)   Influenza vaccine deferral: Deferred  (09/05/2009)    Tetanus booster: 10/27/2003: Td    Pneumococcal vaccine: Not documented  Colorectal Screening   Hemoccult: Not documented    Colonoscopy:  Results: Polyp, sessile at 30 mm. Location:  [facility] - [name].    (06/12/2007)   Colonoscopy action/deferral: Further recommendations pending biopsy results.    (06/12/2007)  Other Screening   PSA: Not documented   Smoking status: current  (09/05/2009)  Diabetes Mellitus   HgbA1C: 7.0  (09/05/2009)    Eye exam: Not documented    Foot exam: yes  (04/12/2008)   Foot exam action/deferral: Do today   High risk foot: Not documented   Foot care  education: Not documented    Urine  microalbumin/creatinine ratio: 24.9  (04/12/2008)    Diabetes flowsheet reviewed?: Yes   Progress toward A1C goal: At goal  Lipids   Total Cholesterol: 113  (04/12/2008)   LDL: 53  (04/12/2008)   LDL Direct: Not documented   HDL: 45  (04/12/2008)   Triglycerides: 77  (04/12/2008)    SGOT (AST): 37  (07/15/2008)   SGPT (ALT): 28  (07/15/2008)   Alkaline phosphatase: 71  (07/15/2008)   Total bilirubin: 0.6  (07/15/2008)    Lipid flowsheet reviewed?: Yes   Progress toward LDL goal: Unchanged  Hypertension   Last Blood Pressure: 140 / 86  (09/05/2009)   Serum creatinine: 1.14  (07/15/2008)   Serum potassium 5.0  (07/15/2008)    Hypertension flowsheet reviewed?: Yes   Progress toward BP goal: At goal  Self-Management Support :   Personal Goals (by the next clinic visit) :     Personal A1C goal: 7  (09/05/2009)     Personal blood pressure goal: 140/90  (09/05/2009)     Personal LDL goal: 100  (09/05/2009)    Patient will work on the following items until the next clinic visit to reach self-care goals:     Medications and monitoring: take my medicines every day, check my blood sugar  (09/05/2009)     Eating: use fresh or frozen vegetables, eat foods that are low in salt, eat fruit for snacks and desserts  (09/05/2009)    Diabetes self-management support: Written self-care plan  (09/05/2009)   Diabetes care plan printed   Last diabetes self-management training by diabetes educator: 08/04/2008    Hypertension self-management support: Written self-care plan  (09/05/2009)   Hypertension self-care plan printed.    Lipid self-management support: Written self-care plan  (09/05/2009)   Lipid self-care plan printed.   Nursing Instructions: Diabetic foot exam today   Process Orders Check Orders Results:     [facility]: [id number] not required for this insurance Tests Sent for requisitioning (September 06, 2009 10:01 AM):     09/05/2009: [facility] --  T-Basic Metabolic Panel 0000000 (signed)     09/05/2009: [facility] -- [facility] w/Diff [id number] (signed)

## 2010-07-03 NOTE — Letter (Signed)
Summary: Patient Notice- Polyp Results  [facility]  [address] [place], [place] [address]   Phone: [phone]  Fax: [phone]        January 17, 2010 MRN: [id number]    [facility] [address] [place], [place]  [address]    Dear [name],  I am pleased to inform you that the colon polyp(s) removed during your recent colonoscopy was (were) found to be benign (no cancer detected) upon pathologic examination.  I recommend you have a repeat colonoscopy examination in _ years to look for recurrent polyps, as having colon polyps increases your risk for having recurrent polyps or even colon cancer in the future.  Should you develop new or worsening symptoms of abdominal pain, bowel habit changes or bleeding from the rectum or bowels, please schedule an evaluation with either your primary care physician or with me.  Additional information/recommendations:  __ No further action with gastroenterology is needed at this time. Please      follow-up with your primary care physician for your other healthcare      needs.  __x Please call [phone] to schedule a return visit to review your      situation.  __ Please keep your follow-up visit as already scheduled.  __ Continue treatment plan as outlined the day of your exam.  Please call us if you are having persistent problems or have questions about your condition that have not been fully answered at this time.  Sincerely,  Rodney Castle MD  This letter has been electronically signed by your physician.  Appended Document: Patient Notice- Polyp Results Letter mailed to patient. Recall is in [place] for 01/2011.

## 2010-07-03 NOTE — Progress Notes (Signed)
Summary: change insulin temporarily/ hla  Phone Note From Pharmacy   Summary of Call: pt's humulin is on order at [facility], until it comes may they give him novolin 70/30? Initial call taken by: Freddy Finner RN,  December 06, 2009 11:20 AM  Follow-up for Phone Call        Please page patient's PCP and clarify this. Follow-up by: Bertha Stakes MD,  December 06, 2009 3:17 PM  Additional Follow-up for Phone Call Additional follow up Details #1::        yes Additional Follow-up by: Pershing Cox MD,  December 06, 2009 8:54 PM

## 2010-07-03 NOTE — Progress Notes (Signed)
Summary: Medications  Phone Note Call from Patient   Caller: Patient Call For: Pershing Cox MD Summary of Call: Call from pt says that he has no monies for mediction.  Has not taken the HCTZ at all  Pt has been taking the Diovan 80 mg tablet.  Insulin is coming from the [facility].  Pt says that he is unable to get to the Clinics or [place].  Pt sys that h can get a ride to the [facility] to pick up the Diovan only.  Cannot come to the Clinics has no monies.  Possible fix to get Diovan 160/25 mg and take i/2 tablet daily [facility] more medications.  Order will need to be written for the Diovan 160/25 mg 1/2 tablet daily.Sander Nephew RN  November 21, 2009 11:03 AM  Initial call taken by: Sander Nephew RN,  November 21, 2009 11:03 AM  Follow-up for Phone Call        Pt currently only on Diovan 80 and BP not controlled.  Has not started the HCTZ.  Agree with Regino Schultze' plan to get Diovan /HCTZ 180/25 and take 1/2 by mouth once daily.  [facility].  Since we are starting a diuretic, he will need a BMP in 2 weeks or so.  Since it seems unlikely that pt will RTC for lab and F/U, will give one month supply only. Follow-up by: Larey Dresser MD,  November 21, 2009 11:36 AM  Additional Follow-up for Phone Call Additional follow up Details #1::        Call to [facility] to have her tell pt to call for an appointment.  Unable to reach pt per phone.  Message was left for pt to call the Clinics to schedule an appointment for labs and that his prescription for the Diovan has been sent to the [facility]. Additional Follow-up by: Sander Nephew RN,  November 21, 2009 2:07 PM    New/Updated Medications: DIOVAN HCT 160-25 MG TABS (VALSARTAN-HYDROCHLOROTHIAZIDE) 1/2 (one half) pill daily by mouth for your high blood pressure Prescriptions: DIOVAN HCT 160-25 MG TABS (VALSARTAN-HYDROCHLOROTHIAZIDE) 1/2 (one half) pill daily by mouth for your high blood  pressure  #16 x 0   Entered and Authorized by:   Larey Dresser MD   Signed by:   Larey Dresser MD on 11/21/2009   Method used:   Faxed to ...       [facility] (retail)       [place], [place]  [address]       Ph: [phone]       Fax: [phone]   RxID:   [id number]   Process Orders Check Orders Results:     [facility]: [id number] not required for this insurance Tests Sent for requisitioning (November 21, 2009 2:06 PM):     11/21/2009: [facility] -- T-Basic Metabolic Panel 0000000 (signed)

## 2010-07-03 NOTE — Miscellaneous (Signed)
Summary: [facility] previsit  Clinical Lists Changes  Medications: Added new medication of MOVIPREP 100 GM  SOLR (PEG-KCL-NACL-NASULF-NA ASC-C) As per prep instructions. - Signed Rx of MOVIPREP 100 GM  SOLR (PEG-KCL-NACL-NASULF-NA ASC-C) As per prep instructions.;  #1 x 0;  Signed;  Entered by: Randall Hiss RN;  Authorized by: Inda Castle MD;  Method used: Print then Give to Patient Observations: Added new observation of NKA: T (01/09/2010 9:35)    Prescriptions: MOVIPREP 100 GM  SOLR (PEG-KCL-NACL-NASULF-NA ASC-C) As per prep instructions.  #1 x 0   Entered by:   Randall Hiss RN   Authorized by:   Inda Castle MD   Signed by:   Randall Hiss RN on 01/09/2010   Method used:   Print then Give to Patient   RxID:   [id number]

## 2010-07-03 NOTE — Procedures (Signed)
Summary: Instructions for procedure/[place]  Instructions for procedure/[place]   Imported By: Phillis Knack 01/11/2010 09:37:52  _____________________________________________________________________  External Attachment:    Type:   Image     Comment:   External Document

## 2010-07-03 NOTE — Letter (Signed)
Summary: Diabetic Instructions  [facility]  [address]   [place], [place] [address]   Phone: [phone]  Fax: [phone]    [name] [birth year] MRN: [id number]   x    ORAL DIABETIC MEDICATION INSTRUCTIONS  The day before your procedure:   Take your diabetic pill as you do normally  The day of your procedure:   Do not take your diabetic pill    We will check your blood sugar levels during the admission process and again in Recovery before discharging you home  ________________________________________________________________________  x     INSULIN (LONG ACTING) MEDICATION INSTRUCTIONS (Lantus, NPH, 70/30, Humulin, Novolin-N)   The day before your procedure:   Take  your regular evening dose    The day of your procedure:   Do not take your morning dose

## 2010-07-03 NOTE — Miscellaneous (Signed)
Summary: HIPAA Restrictions  HIPAA Restrictions   Imported By: Bonner Puna 04/12/2008 14:26:49  _____________________________________________________________________  External Attachment:    Type:   Image     Comment:   External Document

## 2010-07-05 NOTE — Progress Notes (Signed)
Summary: Diovan dosing  Phone Note Other Incoming   Caller: Pharmacist/ [facility] Summary of Call: Call from the pharmacist rom [facility] to verify dosing of Diovan fro pt.  Pt is requesting a refill of he Diovan 80 mg tablets. Pt has been taking 80 mg daily and not the 40 mg tjhat was ordered back in July.  [facility] pharmacy to call pt to let him know of the correct dosing and to have him call the Clinics as soon as possible to schedule labs and a follow up visit. Sander Nephew RN  March 22, 2010 2:00 PM  Initial call taken by: Sander Nephew RN,  March 22, 2010 2:00 PM  Follow-up for Phone Call        Last seen 12/07/09 and Rx Diovan 80 mg but was supposed to take ONE HALF pill once daily. Was asked to RTC 2 weeks but I don't think he was given an appt. Pls clarify with pt and / or pharmacy - was he taking  40mg  Diovan 80 mg Diovan 80mg  diovan but 1/2 pill  Whatever he was taking, pls have him cont as long as asymptomatic.  I sent a flag to Ms Cyndi Bender to schedule an appt.  Follow-up by: Larey Dresser MD,  March 22, 2010 3:23 PM

## 2010-07-05 NOTE — Assessment & Plan Note (Signed)
Summary: EST-CK/FU/MED/CFB   Vital Signs:  Patient profile:   60 year old male Height:      71 inches (180.34 cm) Weight:      261.4 pounds (117.64 kg) BMI:     36.59 Temp:     97.9 degrees F (36.61 degrees C) oral Pulse rate:   71 / minute BP sitting:   151 / 83  (left arm) Cuff size:   large  Vitals Entered By: Lucky Rathke NT II (June 08, 2010 3:42 PM) CC: ROTUINE OFFICE VISIT WITH MEDICATION VISIT  /  DM Is Patient Diabetic? Yes Did you bring your meter with you today? No Pain Assessment Patient in pain? no      Nutritional Status BMI of > 30 = obese CBG Result 57  Have you ever been in a relationship where you felt threatened, hurt or afraid?No   Does patient need assistance? Functional Status Self care Ambulation Normal   Primary Care Provider:  Pershing Cox MD  CC:  ROTUINE OFFICE VISIT WITH MEDICATION VISIT  /  DM.  History of Present Illness: 60 years old male with PMH as detailed below presents for follow up. He has not brought his meter with him but denies any hypoglycemic events at home. He also reports that he is out of his Diovan tablets for almost 2 weeks and restarted 2 days ago. This was because he took double the dose prescribed (1 tab instead of 1/2 tab) and ran out of it. he got new prescription 2 days ago and started it back again. He has no other complains.   Preventive Screening-Counseling & Management  Alcohol-Tobacco     Smoking Status: current     Smoking Cessation Counseling: yes     Packs/Day: 1     Tobacco Counseling: to quit use of tobacco products  Caffeine-Diet-Exercise     Does Patient Exercise: yes     Type of exercise: WALK     Exercise (avg: min/session): 1HOUR     Times/week: 3-4  Current Medications (verified): 1)  Glucophage 1000 Mg Tabs (Metformin Hcl) .... Take 1 Tablet By Mouth Two Times A Day 2)  Lipitor 20 Mg  Tabs (Atorvastatin Calcium) .... Take 1 Tablet By Mouth At Bedtime 3)  Viagra 50 Mg  Tabs  (Sildenafil Citrate) .... Take One Pill By Mouth 1/2 An Hour Before Intercourse 4)  Humulin 70/30 70-30 % Susp (Insulin Isophane & Regular) .... Take 30 Units in The Morning and 10 in The Evening. 5)  1st Choice Lancets Super Thin  Misc (Lancets) 6)  Accusure Insulin Syringe 30g X 5/16" 0.5 Ml Misc (Insulin Syringe-Needle U-100) 7)  Diovan 80 Mg Tabs (Valsartan) .... Take 1/2 Tablet By Mouth Once Per Day For Blood Pressure Until Your Follow Up  Allergies (verified): No Known Drug Allergies  Past History:  Past Medical History: Last updated: 02/20/2010 Diabetes mellitus, type II-  insulin dependent Hyperlipidemia Hypertension Tachycardia:  Sinus,chronic                      ??H/o irregular heart beat per pt Obstructive sleep apnea (hasn't worn his CPAP since 2004) H/o Microcytic anemia with heme positve stools 1/06 Ongoing Tobacco abuse H/o alcohol abuse Fragments of Adenomatous polyps 2009 Esophageal Stricture GERD  Family History: Last updated: May 05, 2008 Father died 34 years of age had diabetes, hypertension and amputations below knee bilateraly secondary to diabetes. Mother is alive and healthy Sister has hypertensive Brother has sleep apnea  Social History: Last updated: 04/12/2008 part time working at [employer]. Lives with wife and kids.   Risk Factors: Exercise: yes (06/08/2010)  Risk Factors: Smoking Status: current (06/08/2010) Packs/Day: 1 (06/08/2010)  Review of Systems      See HPI  Physical Exam  General:  alert, well-developed, well-nourished, well-hydrated, and overweight-appearing.   Head:  normocephalic.   Eyes:  vision grossly intact, pupils equal, pupils round, and pupils reactive to light.   Ears:  no external deformities.   Nose:  no nasal discharge.   Mouth:  pharynx pink and moist.   Neck:  supple.   Lungs:  normal respiratory effort, normal breath sounds, no crackles, and no wheezes.   Heart:  normal rate, regular rhythm, no murmur,  no gallop, no rub, and no JVD.   Abdomen:  soft, non-tender, normal bowel sounds, no distention, no masses, and no guarding.   Msk:  normal ROM, no joint tenderness, and no joint swelling.   Neurologic:  alert & oriented X3, cranial nerves II-XII intact, strength normal in all extremities, sensation intact to light touch, and gait normal.   Psych:  Cognition and judgment appear intact. Alert and cooperative with normal attention span and concentration. No apparent delusions, illusions, hallucinations   Impression & Recommendations:  Problem # 1:  HYPERTENSION (ICD-401.9) BP elevated but only taken meds for 2 days after 2 weeks of not taking meds. He will follow up in 2 weeks and we will reassess need for another BP med. In past he reports syncopal attack with HCTZ.  His updated medication list for this problem includes:    Diovan 80 Mg Tabs (Valsartan) .Marland Kitchen... Take 1/2 tablet by mouth once per day for blood pressure until your follow up  BP today: 151/83 Prior BP: 147/87 (12/07/2009)  Labs Reviewed: K+: 4.8 (12/05/2009) Creat: : 1.09 (12/05/2009)   Chol: 113 (04/12/2008)   HDL: 45 (04/12/2008)   LDL: 53 (04/12/2008)   TG: 77 (04/12/2008)  Problem # 2:  HYPERLIPIDEMIA (ICD-272.4) I will recheck lipid profile for further guidance. AT present does not report any muscle pain or weakness.  His updated medication list for this problem includes:    Lipitor 20 Mg Tabs (Atorvastatin calcium) .Marland Kitchen... Take 1 tablet by mouth at bedtime  Orders: T-Lipid Profile [id number])  Labs Reviewed: SGOT: 35 (12/05/2009)   SGPT: 29 (12/05/2009)   HDL:45 (04/12/2008), 44 (04/14/2007)  LDL:53 (04/12/2008), 85 (04/14/2007)  Chol:113 (04/12/2008), 153 (04/14/2007)  Trig:77 (04/12/2008), 120 (04/14/2007)  Problem # 3:  DIABETIC HYPOGLYCEMIA, TYPE II (ICD-250.80) Hypoglycemia once again on the office visit. He had similar problem on office visit a year ago, when he takes insulin but does not eat to "impress"  doctor. He was given crackers and juice and CBG improved.  His updated medication list for this problem includes:    Glucophage 1000 Mg Tabs (Metformin hcl) .Marland Kitchen... Take 1 tablet by mouth two times a day    Humulin 70/30 70-30 % Susp (Insulin isophane & regular) .Marland Kitchen... Take 30 units in the morning and 10 in the evening.    Diovan 80 Mg Tabs (Valsartan) .Marland Kitchen... Take 1/2 tablet by mouth once per day for blood pressure until your follow up  Labs Reviewed: Creat: 1.09 (12/05/2009)    Reviewed HgBA1c results: 5.4 (06/08/2010)  7.0 (09/05/2009)  Problem # 4:  DIZZINESS (ICD-780.4) Reprots dizziness in past with HCTZ resolved upon discontinuation. I also wonder if some of it is related to hypoglycemia given his A1C is low on todays  lab. At present not dizzy and ambulating well.   Problem # 5:  TOBACCO ABUSE (ICD-305.1) Has decreased smoking but not ready to quit.   Problem # 6:  OBSTRUCTIVE SLEEP APNEA (ICD-327.23) reports he was tested last year but does not have CPAP. I will try to get report from [facility] sleep study center.   Problem # 7:  DIABETES MELLITUS, TYPE II (ICD-250.00) Appears to be hypoglycemic, based on A1c. However he reports normal sugars at home. At present, he is hypoglycemic inthe clinic. I have decreased his insulin dose by 10 units and asked him to follow up in two days with his glucometer.   His updated medication list for this problem includes:    Glucophage 1000 Mg Tabs (Metformin hcl) .Marland Kitchen... Take 1 tablet by mouth two times a day    Humulin 70/30 70-30 % Susp (Insulin isophane & regular) .Marland Kitchen... Take 30 units in the morning and 10 in the evening.    Diovan 80 Mg Tabs (Valsartan) .Marland Kitchen... Take 1/2 tablet by mouth once per day for blood pressure until your follow up  Orders: T- Capillary Blood Glucose [id number]) T-Hgb A1C (in-house) [id number])  Labs Reviewed: Creat: 1.09 (12/05/2009)    Reviewed HgBA1c results: 5.4 (06/08/2010)  7.0 (09/05/2009)  Complete Medication List: 1)   Glucophage 1000 Mg Tabs (Metformin hcl) .... Take 1 tablet by mouth two times a day 2)  Lipitor 20 Mg Tabs (Atorvastatin calcium) .... Take 1 tablet by mouth at bedtime 3)  Viagra 50 Mg Tabs (Sildenafil citrate) .... Take one pill by mouth 1/2 an hour before intercourse 4)  Humulin 70/30 70-30 % Susp (Insulin isophane & regular) .... Take 30 units in the morning and 10 in the evening. 5)  1st Choice Lancets Super Thin Misc (Lancets) 6)  Accusure Insulin Syringe 30g X 5/16" 0.5 Ml Misc (Insulin syringe-needle u-100) 7)  Diovan 80 Mg Tabs (Valsartan) .... Take 1/2 tablet by mouth once per day for blood pressure until your follow up  Patient Instructions: 1)  Please schedule a follow-up appointment in 2 weeks. Prescriptions: GLUCOPHAGE 1000 MG TABS (METFORMIN HCL) Take 1 tablet by mouth two times a day  #60 Tablet x 2   Entered and Authorized by:   Pershing Cox MD   Signed by:   Pershing Cox MD on 06/08/2010   Method used:   Electronically to        [facility] (retail)       [address]       [place], [place]  [address]       Ph: [phone] or [phone]       Fax: [phone]   RxID:   [id number] VIAGRA 50 MG  TABS (SILDENAFIL CITRATE) Take one pill by mouth 1/2 an hour before intercourse  #10 x 5   Entered and Authorized by:   Pershing Cox MD   Signed by:   Pershing Cox MD on 06/08/2010   Method used:   Electronically to        [facility] (retail)       [address]       [place], [place]  [address]       Ph: [phone] or [phone]       Fax: [phone]   RxID:   [id number] DIOVAN 80 MG TABS (VALSARTAN) Take 1/2 tablet by mouth once  per day for blood pressure until your follow up  #30 x 3   Entered and Authorized by:   Pershing Cox MD   Signed by:   Pershing Cox MD on 06/08/2010   Method used:   Faxed to ...       [facility]  (retail)       [place], [place]  [address]       Ph: [phone]       Fax: [phone]   RxID:   [id number] VIAGRA 50 MG  TABS (SILDENAFIL CITRATE) Take one pill by mouth 1/2 an hour before intercourse  #15 x 5   Entered and Authorized by:   Pershing Cox MD   Signed by:   Pershing Cox MD on 06/08/2010   Method used:   Faxed to ...       [facility] (retail)       [place], [place]  [address]       Ph: [phone]       Fax: [phone]   RxID:   [id number] LIPITOR 20 MG  TABS (ATORVASTATIN CALCIUM) Take 1 tablet by mouth at bedtime  #31 x 11   Entered and Authorized by:   Pershing Cox MD   Signed by:   Pershing Cox MD on 06/08/2010   Method used:   Faxed to ...       [facility] (retail)       [place], [place]  [address]       Ph: [phone]       Fax: [phone]   RxID:   [id number] GLUCOPHAGE 1000 MG TABS (METFORMIN HCL) Take 1 tablet by mouth two times a day  #60 Tablet x 2   Entered and Authorized by:   Pershing Cox MD   Signed by:   Pershing Cox MD on 06/08/2010   Method used:   Faxed to ...       [facility] (retail)       [place], [place]  [address]       Ph: [phone]       Fax: [phone]   RxID:   [id number]    Orders Added: 1)  T- Capillary Blood Glucose [[id number]] 2)  T-Hgb A1C (in-house) [[id number]] 3)  T-Lipid Profile [[id number]] 4)  Est. Patient Level IV [id number]   Process Orders Check Orders Results:     [facility]: [id number] not required for this insurance Tests Sent for requisitioning (June 09, 2010 12:20 PM):     06/08/2010: [facility] -- T-Lipid Profile [id number] (signed)     Prevention & Chronic Care Immunizations   Influenza vaccine: Fluvax Non-MCR  (04/12/2008)   Influenza vaccine deferral: Refused  (06/08/2010)    Tetanus  booster: 10/27/2003: Td   Td booster deferral: Refused  (06/08/2010)    Pneumococcal vaccine: Not documented   Pneumococcal vaccine deferral: Refused  (06/08/2010)  Colorectal Screening   Hemoccult: Not documented    Colonoscopy: DONE  (01/15/2010)   Colonoscopy action/deferral: Further recommendations pending biopsy results.    (06/12/2007)  Other Screening   PSA: Not documented   PSA action/deferral: Discussion deferred  (12/07/2009)   Smoking status: current  (06/08/2010)   Smoking cessation counseling: yes  (06/08/2010)  Diabetes Mellitus  HgbA1C: 5.4  (06/08/2010)    Eye exam: Not documented    Foot exam: yes  (04/12/2008)   Foot exam action/deferral: Do today   High risk foot: Not documented   Foot care education: Not documented    Urine microalbumin/creatinine ratio: 24.9  (04/12/2008)    Diabetes flowsheet reviewed?: Yes   Progress toward A1C goal: Deteriorated  Lipids   Total Cholesterol: 113  (04/12/2008)   Lipid panel action/deferral: Lipid Panel ordered   LDL: 53  (04/12/2008)   LDL Direct: Not documented   HDL: 45  (04/12/2008)   Triglycerides: 77  (04/12/2008)    SGOT (AST): 35  (12/05/2009)   SGPT (ALT): 29  (12/05/2009)   Alkaline phosphatase: 53  (12/05/2009)   Total bilirubin: 0.8  (12/05/2009)    Lipid flowsheet reviewed?: Yes   Progress toward LDL goal: Unchanged  Hypertension   Last Blood Pressure: 151 / 83  (06/08/2010)   Serum creatinine: 1.09  (12/05/2009)   Serum potassium 4.8  (12/05/2009)    Hypertension flowsheet reviewed?: Yes   Progress toward BP goal: Deteriorated  Self-Management Support :   Personal Goals (by the next clinic visit) :     Personal A1C goal: 7  (09/05/2009)     Personal blood pressure goal: 140/90  (09/05/2009)     Personal LDL goal: 100  (09/05/2009)    Patient will work on the following items until the next clinic visit to reach self-care goals:     Medications and monitoring: take my medicines every  day, check my blood sugar, examine my feet every day  (06/08/2010)     Eating: drink diet soda or water instead of juice or soda, eat more vegetables, use fresh or frozen vegetables, eat foods that are low in salt, eat baked foods instead of fried foods, eat fruit for snacks and desserts, limit or avoid alcohol  (06/08/2010)    Diabetes self-management support: Resources for patients handout, Written self-care plan  (06/08/2010)   Diabetes care plan printed   Last diabetes self-management training by diabetes educator: 08/04/2008    Hypertension self-management support: Resources for patients handout, Written self-care plan  (06/08/2010)   Hypertension self-care plan printed.    Lipid self-management support: Resources for patients handout, Written self-care plan  (06/08/2010)   Lipid self-care plan printed.      Resource handout printed.   Laboratory Results   Blood Tests   Date/Time Received: June 08, 2010 3:59 PM Date/Time Reported: Maryan Rued  June 08, 2010 3:59 PM   HGBA1C: 5.4%   (Normal Range: Non-Diabetic - 3-6%   Control Diabetic - 6-8%) CBG Random:: 57mg /dL  Comments: CBG results of 57 reported to Lela S NT by Maryan Rued, PBT at [id number] 06-08-10.  CBG was repeated for verification - value of [address]  [facility]  June 08, 2010 4:00 PM

## 2010-07-05 NOTE — Progress Notes (Signed)
Summary: REfill/gh  Phone Note Refill Request Message from:  Patient on May 31, 2010 11:43 AM  Refills Requested: Medication #1:  DIOVAN 80 MG TABS Take 1/2 tablet by mouth once per day for blood pressure until your follow up.  Method Requested: Fax to [facility] Initial call taken by: Sander Nephew RN,  May 31, 2010 11:43 AM  Follow-up for Phone Call        Refill approved-nurse to complete    Prescriptions: DIOVAN 80 MG TABS (VALSARTAN) Take 1/2 tablet by mouth once per day for blood pressure until your follow up  #30 x 3   Entered by:   Rhea Pink  DO   Authorized by:   Pershing Cox MD   Signed by:   Rhea Pink  DO on 05/31/2010   Method used:   Faxed to ...       [facility] (retail)       [address] [place], [place]  [address]       Ph: [phone]       Fax: [phone]   RxID:   [id number]

## 2010-08-17 LAB — GLUCOSE, CAPILLARY: Glucose-Capillary: 82 mg/dL (ref 70–99)

## 2010-08-21 LAB — GLUCOSE, CAPILLARY
Glucose-Capillary: 211 mg/dL — ABNORMAL HIGH (ref 70–99)
Glucose-Capillary: 89 mg/dL (ref 70–99)

## 2010-08-22 LAB — GLUCOSE, CAPILLARY: Glucose-Capillary: 50 mg/dL — ABNORMAL LOW (ref 70–99)

## 2010-09-10 ENCOUNTER — Encounter: Payer: Self-pay | Admitting: Internal Medicine

## 2010-09-10 ENCOUNTER — Ambulatory Visit (INDEPENDENT_AMBULATORY_CARE_PROVIDER_SITE_OTHER): Payer: Self-pay | Admitting: Internal Medicine

## 2010-09-10 DIAGNOSIS — E119 Type 2 diabetes mellitus without complications: Secondary | ICD-10-CM

## 2010-09-10 DIAGNOSIS — E785 Hyperlipidemia, unspecified: Secondary | ICD-10-CM

## 2010-09-10 DIAGNOSIS — L02821 Furuncle of head [any part, except face]: Secondary | ICD-10-CM

## 2010-09-10 DIAGNOSIS — I10 Essential (primary) hypertension: Secondary | ICD-10-CM

## 2010-09-10 DIAGNOSIS — L02828 Furuncle of other sites: Secondary | ICD-10-CM

## 2010-09-10 DIAGNOSIS — F17200 Nicotine dependence, unspecified, uncomplicated: Secondary | ICD-10-CM

## 2010-09-10 LAB — LIPID PANEL
Cholesterol: 116 mg/dL (ref 0–200)
HDL: 47 mg/dL (ref >39)
LDL Cholesterol: 44 mg/dL (ref 0–99)
Total CHOL/HDL Ratio: 2.5 Ratio
Triglycerides: 126 mg/dL (ref <150)
VLDL: 25 mg/dL (ref 0–40)

## 2010-09-10 LAB — GLUCOSE, CAPILLARY: Glucose-Capillary: 125 mg/dL — ABNORMAL HIGH (ref 70–99)

## 2010-09-10 LAB — POCT GLYCOSYLATED HEMOGLOBIN (HGB A1C): Hemoglobin A1C: 6.1

## 2010-09-10 MED ORDER — MUPIROCIN CALCIUM 2 % EX CREA: TOPICAL_CREAM | CUTANEOUS | Status: DC

## 2010-09-10 NOTE — Patient Instructions (Signed)
Return in three months.

## 2010-09-10 NOTE — Assessment & Plan Note (Signed)
BP well controlled. No changes in medications.

## 2010-09-10 NOTE — Assessment & Plan Note (Signed)
I talked about using a Mudlogger of soap as needed. I also given him Bactroban ointment.

## 2010-09-10 NOTE — Assessment & Plan Note (Signed)
Cutting back but has not quit yet. Not ready to quit for now.

## 2010-09-10 NOTE — Assessment & Plan Note (Signed)
His HbA1c at goal range. He'll continue his current regimen of insulin. We discussed diet and medications regarding diabetes control once again. he will follow up in 3 months.

## 2010-09-10 NOTE — Assessment & Plan Note (Signed)
No FLP in 2 years. I will check liver enzymes and lipid profile. Compliant with the medicine. Denies any significant muscle aches.

## 2010-09-10 NOTE — Progress Notes (Signed)
  Subjective:    Patient ID: [name], male    DOB: [birth year], 60 y.o.   MRN: [id number]  HPI Patient is a 60 year old gentleman with a history of type 2 diabetes, hypertension, and hyperlipidemia who presents with complaint of small furuncle on his scalp area. He's also here for a followup.  Patient has no other complaints. He says he noticed a little bump on the occipital area about a week ago. It is painful when touched. Otherwise it doesn't bother him. He denies any other similar lesions. He denies any fever nausea or vomiting.   Review of Systems  All other systems reviewed and are negative.       Objective:   Physical Exam BP 129/79  Pulse 81  Temp(Src) 97.1 F (36.2 C) (Oral)  Ht 5\' 11"  (1.803 m)  Wt 261 lb 11.2 oz (118.706 kg)  BMI 36.50 kg/m2  General Appearance:    Alert, cooperative, no distress, appears stated age  Head:    Normocephalic, without obvious abnormality, atraumatic  Eyes:    PERRL, conjunctiva/corneas clear, EOM's intact, fundi    benign, both eyes       Ears:    Normal TM's and external ear canals, both ears  Nose:   Nares normal, septum midline, mucosa normal, no drainage   or sinus tenderness  Throat:   Lips, mucosa, and tongue normal; teeth and gums normal  Neck:   Supple, symmetrical, trachea midline, no adenopathy;       thyroid:  No enlargement/tenderness/nodules; no carotid   bruit or JVD  Back:     Symmetric, no curvature, ROM normal, no CVA tenderness  Lungs:     Clear to auscultation bilaterally, respirations unlabored  Chest wall:    No tenderness or deformity  Heart:    Regular rate and rhythm, S1 and S2 normal, no murmur, rub   or gallop  Abdomen:     Soft, non-tender, bowel sounds active all four quadrants,    no masses, no organomegaly  Extremities:   Extremities normal, atraumatic, no cyanosis or edema  Pulses:   2+ and symmetric all extremities  Skin:   Skin color, texture, turgor normal, small sub centimeter furuncle in  the scalp occipital area. No discharge.   Lymph nodes:   Cervical, supraclavicular, and axillary nodes normal  Neurologic:   CNII-XII intact. Normal strength, sensation and reflexes      throughout         Assessment & Plan:

## 2010-09-11 LAB — GLUCOSE, CAPILLARY: Glucose-Capillary: 92 mg/dL (ref 70–99)

## 2010-09-13 LAB — GLUCOSE, CAPILLARY: Glucose-Capillary: 112 mg/dL — ABNORMAL HIGH (ref 70–99)

## 2010-09-17 LAB — GLUCOSE, CAPILLARY: Glucose-Capillary: 265 mg/dL — ABNORMAL HIGH (ref 70–99)

## 2010-10-16 NOTE — Assessment & Plan Note (Signed)
[facility] OFFICE NOTE   NAME:[name], [name]                     MRN:          [id number]  DATE:05/12/2007                            DOB:          [birth year]    REASON FOR CONSULTATION:  Anemia.   [name] is [name] pleasant 60 year old African American male referred  through the courtesy of Dr. Riccardo Dubin for evaluation.  Routine testing  demonstrated heme-positive stool.  Lab work was pertinent for [name]  hemoglobin of 15.2 and an MCV of 72.  [name] is on no gastric  irritants including nonsteroidals.  He does complain of occasional  pyrosis and dysphagia to solids.  He attributes the latter to not  chewing his food well.  He denies abdominal pain, change of bowel  habits, melena or hematochezia.   PAST MEDICAL HISTORY:  Pertinent for diabetes and hypertension.  He has  sleep apnea.   FAMILY HISTORY:  Noncontributory.   MEDICATIONS:  Include Glucophage, glipizide, lisinopril and Lipitor.  He  has no allergies.   He smokes half [name] pack [name] day.  He no longer drinks, though he was [name] heavy  drinker until 2 years ago.  He is married and unemployed.   REVIEW OF SYSTEMS:  Positive for sleeping problems.   PHYSICAL EXAMINATION:  Pulse 88, blood pressure 102/60, weight 271.  HEENT: EOMI.  PERRLA.  Sclerae are anicteric.  Conjunctivae are pink.  NECK:  Supple without thyromegaly, adenopathy or carotid bruits.  CHEST:  Clear to auscultation and percussion without adventitious  sounds.  CARDIAC:  Regular rhythm; normal S1 S2.  There are no murmurs, gallops  or rubs.  ABDOMEN:  Bowel sounds are normoactive.  Abdomen is soft, nontender and  nondistended.  There are no abdominal masses, tenderness, splenic  enlargement or hepatomegaly.  EXTREMITIES:  Full range of motion.  No cyanosis, clubbing or edema.  RECTAL:  Deferred.   IMPRESSION:  1. Microcytic anemia with heme-positive stool.  Sources for chronic  gastrointestinal blood loss including polyps, AVMs, neoplasm and      hemorrhoids are considerations.  Dysphagia also raises the question      of [name] stricture which could be [name] source for bleeding as well if this      were malignant.  2. Dysphagia - rule out esophageal stricture.   RECOMMENDATION:  1. Colonoscopy.  2. Upper endoscopy with dilatation as indicated.     Sandy Salaam. [name], MD,FACG  Electronically Signed    RDK/MedQ  DD: 05/12/2007  DT: 05/12/2007  Job #: [id number]   cc:   Erma Heritage, M.D.

## 2010-10-16 NOTE — Procedures (Signed)
NAME:  [name], MCGOVERN              ACCOUNT NO.:  [id number]   MEDICAL RECORD NO.:  [id number]          PATIENT TYPE:  OUT   LOCATION:  SLEEP CENTER                 FACILITY[facility]  [facility]   PHYSICIAN:  Clinton D. Annamaria Boots, MD, FCCP, FACPDATE OF BIRTH:  09/19/1950   DATE OF STUDY:  07/17/2007                            NOCTURNAL POLYSOMNOGRAM   REFERRING PHYSICIAN:  Erma Heritage, M.D.   INDICATION FOR STUDY:  Hypersomnia with sleep apnea.   EPWORTH SLEEPINESS SCORE:  16/24.  BMI 36.6.  Weight 270 pounds.  Height  72 inches.  Neck 18.5 inches.   HOME MEDICATIONS:  Charted and reviewed.   A baseline diagnostic study with split protocol on March 27, 2001, had  recorded an AHI of 73 per hour.  CPAP was then titrated to 12-CWP.  CPAP  titration is now requested.   SLEEP ARCHITECTURE:  Total sleep time 265 minutes with sleep efficiency  68.4%.  Stage I was 10.9%, stage II 72.9%, stage III absent, REM 16.2%  of total sleep time.  Sleep latency 7.5 minutes.  REM latency 85  minutes.  Awake after sleep onset 108 minutes.  Arousal index 22.4.  No  bedtime medication was taken.   RESPIRATORY DATA:  CPAP titration protocol.  CPAP was titrated to 11-  CWP.  AHI 0 per hour.  He chose a medium Mirage Quattro mask with heated  humidifier.   OXYGEN DATA:  Snoring was prevented by CPAP and oxygen saturation held  at a mean of 95.9% on room air.   CARDIAC DATA:  Normal sinus rhythm.   MOVEMENT-PARASOMNIA:  No significant movement disturbance.  Bathroom x2.   IMPRESSIONS-RECOMMENDATIONS:  1. Successful CPAP titration to 11-CWP, apnea/hypopnea index 0 per      hour.  He choose a medium Mirage Quattro mask with heated      humidifier.  2. Previous testing on March 27, 2001, had recorded an      apnea/hypopnea index of 73 per hour and by split protocol at that      time CPAP was titrated to 12-CWP.      Clinton D. Annamaria Boots, MD, FCCP, FACP  Diplomate, Tax adviser of Sleep Medicine  Electronically Signed     CDY/MEDQ  D:  07/25/2007 11:14:57  T:  07/26/2007 13:22:46  Job:  [id number]

## 2010-10-16 NOTE — Letter (Signed)
May 12, 2007    Erma Heritage, M.D.  [address], [place] [address]   RE:  DENY, [name]  MRN:  [id number]  /  DOB:  19-Jun-1950   Dear Dr. Riccardo Dubin:   Upon your kind referral, I had the pleasure of evaluating your patient  and I am pleased to offer my findings.  I saw [name] in the  office today.  Enclosed is a copy of my progress note that details my  findings and recommendations.   Thank you for the opportunity to participate in your patient's care.    Sincerely,      Sandy Salaam. [name], MD,FACG  Electronically Signed    RDK/MedQ  DD: 05/12/2007  DT: 05/12/2007  Job #: [id number]

## 2010-10-16 NOTE — Letter (Signed)
May 12, 2007    [name]   RE:  [name], [name]  MRN:  [id number]  /  DOB:  February 20, 1951   Dear [name]:   It is my pleasure to have treated you recently as a new patient in my  office.  I appreciate your confidence and the opportunity to participate  in your care.   Since I do have a busy inpatient endoscopy schedule and office schedule,  my office hours vary weekly.  I am, however, available for emergency  calls every day through my office.  If I cannot promptly meet an urgent  office appointment, another one of our gastroenterologists will be able  to assist you.   My well-trained staff are prepared to help you at all times.  For  emergencies after office hours, a physician from our gastroenterology  section is always available through my 24-hour answering service.   While you are under my care, I encourage discussion of your questions  and concerns, and I will be happy to return your calls as soon as I am  available.   Once again, I welcome you as a new patient and I look forward to a happy  and healthy relationship.    Sincerely,      Sandy Salaam. [name], MD,FACG  Electronically Signed   RDK/MedQ  DD: 05/12/2007  DT: 05/12/2007  Job #: [id number]

## 2011-01-10 ENCOUNTER — Other Ambulatory Visit: Payer: Self-pay | Admitting: Internal Medicine

## 2011-02-11 ENCOUNTER — Encounter: Payer: Self-pay | Admitting: Gastroenterology

## 2011-02-15 ENCOUNTER — Encounter: Payer: Self-pay | Admitting: Internal Medicine

## 2011-02-26 ENCOUNTER — Other Ambulatory Visit: Payer: Self-pay | Admitting: *Deleted

## 2011-02-27 MED ORDER — INSULIN NPH ISOPHANE & REGULAR (70-30) 100 UNIT/ML ~~LOC~~ SUSP: SUBCUTANEOUS | Status: DC

## 2011-03-05 LAB — GLUCOSE, CAPILLARY: Glucose-Capillary: 389 — ABNORMAL HIGH

## 2011-05-31 ENCOUNTER — Other Ambulatory Visit: Payer: Self-pay | Admitting: *Deleted

## 2011-05-31 MED ORDER — VALSARTAN 80 MG PO TABS: 40.0000 mg | ORAL_TABLET | Freq: Every day | ORAL | Status: DC

## 2011-05-31 NOTE — Telephone Encounter (Signed)
Diovan rx faxed to [place].

## 2011-06-17 ENCOUNTER — Other Ambulatory Visit: Payer: Self-pay | Admitting: *Deleted

## 2011-06-17 MED ORDER — ATORVASTATIN CALCIUM 20 MG PO TABS: 20.0000 mg | ORAL_TABLET | Freq: Every day | ORAL | Status: DC

## 2011-07-04 ENCOUNTER — Encounter: Payer: Self-pay | Admitting: Gastroenterology

## 2011-07-04 ENCOUNTER — Encounter: Payer: Self-pay | Admitting: Internal Medicine

## 2011-07-04 ENCOUNTER — Ambulatory Visit (INDEPENDENT_AMBULATORY_CARE_PROVIDER_SITE_OTHER): Payer: Self-pay | Admitting: Internal Medicine

## 2011-07-04 VITALS — BP 149/89 | HR 78 | Temp 97.6°F | Ht 72.0 in | Wt 265.3 lb

## 2011-07-04 DIAGNOSIS — Z86010 Personal history of colonic polyps: Secondary | ICD-10-CM | POA: Insufficient documentation

## 2011-07-04 DIAGNOSIS — D539 Nutritional anemia, unspecified: Secondary | ICD-10-CM

## 2011-07-04 DIAGNOSIS — Z9889 Other specified postprocedural states: Secondary | ICD-10-CM

## 2011-07-04 DIAGNOSIS — E785 Hyperlipidemia, unspecified: Secondary | ICD-10-CM

## 2011-07-04 DIAGNOSIS — E119 Type 2 diabetes mellitus without complications: Secondary | ICD-10-CM

## 2011-07-04 DIAGNOSIS — G4733 Obstructive sleep apnea (adult) (pediatric): Secondary | ICD-10-CM

## 2011-07-04 DIAGNOSIS — Z23 Encounter for immunization: Secondary | ICD-10-CM

## 2011-07-04 DIAGNOSIS — I10 Essential (primary) hypertension: Secondary | ICD-10-CM

## 2011-07-04 LAB — GLUCOSE, CAPILLARY: Glucose-Capillary: 76 mg/dL (ref 70–99)

## 2011-07-04 LAB — POCT GLYCOSYLATED HEMOGLOBIN (HGB A1C): Hemoglobin A1C: 6.3

## 2011-07-04 MED ORDER — PNEUMOCOCCAL VAC POLYVALENT 25 MCG/0.5ML IJ INJ: 0.5000 mL | INJECTION | Freq: Once | INTRAMUSCULAR | Status: DC

## 2011-07-04 NOTE — Assessment & Plan Note (Signed)
[name] tells me that he has a prior diagnosis of obstructive sleep apnea. I obtained the prior records from his sleep study in 2008 which indeed stated that he would benefit from CPAP. I gave [facility] a referral to [facility] and to report to my nurse for her to fax to [facility].

## 2011-07-04 NOTE — Progress Notes (Signed)
Pt aware of appt with Dr [name] FU colonoscopy 07/19/11 9AM and to see nurse 07/09/11 2PM. Hilda Blades Aurielle Slingerland RN 07/04/11 3:30PM

## 2011-07-04 NOTE — Assessment & Plan Note (Signed)
It is concerning that 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 My nurse called his gastroenterologist office, [name].  [name] was to followup with them in August of 2012, but has not done so. I made a followup appointment for him to be seen in Dr. Kelby Fam office on February 5.
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
## 2011-07-04 NOTE — Patient Instructions (Addendum)
You blood pressure was a little high.  We will recheck it at your next visit. If it is still high we will increase your blood pressure medicine. Continue to loose weight and exercise and this will help lower your blood pressure.  Continue with the excellent job on taking care of your Diabetes.    You were supposed to have followed up with Dr. Kelby Fam office in August of 2012 regarding your colonoscopy results. We have made an appointment for you on February February 5 please be there at 2 PM. Her colonoscopy will be on February 15 at 9 AM.  You must call the if you cannot make the appointment @ [phone]  We will call the sleep lab regarding your sleep study. If you require CPAP I will call in a prescription for you.   If any of your lab results are abnormal, we will contact you by phone or send you a letter. If they are normal, we will not contact you, but will be happy to discuss them at your next clinic appointment.  Return to clinic to see Dr. Rosine Door in 3-4 months Please bring all your medications to your next clinic appointment.   Diabetes and Exercise Regular exercise is important and can help:   Control blood glucose (sugar).   Decrease blood pressure.    Control blood lipids (cholesterol, triglycerides).   Improve overall health.  BENEFITS FROM EXERCISE  Improved fitness.   Improved flexibility.   Improved endurance.   Increased bone density.   Weight control.   Increased muscle strength.   Decreased body fat.   Improvement of the body's use of insulin, a hormone.   Increased insulin sensitivity.   Reduction of insulin needs.   Reduced stress and tension.   Helps you feel better.  People with diabetes who add exercise to their lifestyle gain additional benefits, including:  Weight loss.   Reduced appetite.   Improvement of the body's use of blood glucose.   Decreased risk factors for heart disease:   Lowering of cholesterol and triglycerides.     Raising the level of good cholesterol (high-density lipoproteins, HDL).   Lowering blood sugar.   Decreased blood pressure.  TYPE 1 DIABETES AND EXERCISE  Exercise will usually lower your blood glucose.   If blood glucose is greater than 240 mg/dl, check urine ketones. If ketones are present, do not exercise.   Location of the insulin injection sites may need to be adjusted with exercise. Avoid injecting insulin into areas of the body that will be exercised. For example, avoid injecting insulin into:   The arms when playing tennis.   The legs when jogging. For more information, discuss this with your caregiver.   Keep a record of:   Food intake.   Type and amount of exercise.   Expected peak times of insulin action.   Blood glucose levels.  Do this before, during, and after exercise. Review your records with your caregiver. This will help you to develop guidelines for adjusting food intake and insulin amounts.  TYPE 2 DIABETES AND EXERCISE  Regular physical activity can help control blood glucose.   Exercise is important because it may:   Increase the body's sensitivity to insulin.   Improve blood glucose control.   Exercise reduces the risk of heart disease. It decreases serum cholesterol and triglycerides. It also lowers blood pressure.   Those who take insulin or oral hypoglycemic agents should watch for signs of hypoglycemia. These signs include dizziness, shaking, sweating,  chills, and confusion.   Body water is lost during exercise. It must be replaced. This will help to avoid loss of body fluids (dehydration) or heat stroke.  Be sure to talk to your caregiver before starting an exercise program to make sure it is safe for you. Remember, any activity is better than none.  Document Released: 08/10/2003 Document Revised: 01/30/2011 Document Reviewed: 11/24/2008 [facility] Patient Information 2012 [place].   Hypertension As your heart beats, it forces  blood through your arteries. This force is your blood pressure. If the pressure is too high, it is called hypertension (HTN) or high blood pressure. HTN is dangerous because you may have it and not know it. High blood pressure may mean that your heart has to work harder to pump blood. Your arteries may be narrow or stiff. The extra work puts you at risk for heart disease, stroke, and other problems.  Blood pressure consists of two numbers, a higher number over a lower, 110/72, for example. It is stated as "110 over 72." The ideal is below 120 for the top number (systolic) and under 80 for the bottom (diastolic). Write down your blood pressure today. You should pay close attention to your blood pressure if you have certain conditions such as:  Heart failure.   Prior heart attack.   Diabetes   Chronic kidney disease.   Prior stroke.   Multiple risk factors for heart disease.  To see if you have HTN, your blood pressure should be measured while you are seated with your arm held at the level of the heart. It should be measured at least twice. A one-time elevated blood pressure reading (especially in the Emergency Department) does not mean that you need treatment. There may be conditions in which the blood pressure is different between your right and left arms. It is important to see your caregiver soon for a recheck. Most people have essential hypertension which means that there is not a specific cause. This type of high blood pressure may be lowered by changing lifestyle factors such as:  Stress.   Smoking.   Lack of exercise.   Excessive weight.   Drug/tobacco/alcohol use.   Eating less salt.  Most people do not have symptoms from high blood pressure until it has caused damage to the body. Effective treatment can often prevent, delay or reduce that damage. TREATMENT  When a cause has been identified, treatment for high blood pressure is directed at the cause. There are a large number of  medications to treat HTN. These fall into several categories, and your caregiver will help you select the medicines that are best for you. Medications may have side effects. You should review side effects with your caregiver. If your blood pressure stays high after you have made lifestyle changes or started on medicines,   Your medication(s) may need to be changed.   Other problems may need to be addressed.   Be certain you understand your prescriptions, and know how and when to take your medicine.   Be sure to follow up with your caregiver within the time frame advised (usually within two weeks) to have your blood pressure rechecked and to review your medications.   If you are taking more than one medicine to lower your blood pressure, make sure you know how and at what times they should be taken. Taking two medicines at the same time can result in blood pressure that is too low.  SEEK IMMEDIATE MEDICAL CARE IF:  You  develop a severe headache, blurred or changing vision, or confusion.   You have unusual weakness or numbness, or a faint feeling.   You have severe chest or abdominal pain, vomiting, or breathing problems.  MAKE SURE YOU:   Understand these instructions.   Will watch your condition.   Will get help right away if you are not doing well or get worse.  Document Released: 05/20/2005 Document Revised: 01/30/2011 Document Reviewed: 01/08/2008 [facility] Patient Information 2012 [place].   Smoking Cessation This document explains the best ways for you to quit smoking and new treatments to help. It lists new medicines that can double or triple your chances of quitting and quitting for good. It also considers ways to avoid relapses and concerns you may have about quitting, including weight gain. NICOTINE: A POWERFUL ADDICTION If you have tried to quit smoking, you know how hard it can be. It is hard because nicotine is a very addictive drug. For some people, it can be as  addictive as heroin or cocaine. Usually, people make 2 or 3 tries, or more, before finally being able to quit. Each time you try to quit, you can learn about what helps and what hurts. Quitting takes hard work and a lot of effort, but you can quit smoking. QUITTING SMOKING IS ONE OF THE MOST IMPORTANT THINGS YOU WILL EVER DO.  You will live longer, feel better, and live better.   The impact on your body of quitting smoking is felt almost immediately:   Within 20 minutes, blood pressure decreases. Pulse returns to its normal level.   After 8 hours, carbon monoxide levels in the blood return to normal. Oxygen level increases.   After 24 hours, chance of heart attack starts to decrease. Breath, hair, and body stop smelling like smoke.   After 48 hours, damaged nerve endings begin to recover. Sense of taste and smell improve.   After 72 hours, the body is virtually free of nicotine. Bronchial tubes relax and breathing becomes easier.   After 2 to 12 weeks, lungs can hold more air. Exercise becomes easier and circulation improves.   Quitting will reduce your risk of having a heart attack, stroke, cancer, or lung disease:   After 1 year, the risk of coronary heart disease is cut in half.   After 5 years, the risk of stroke falls to the same as a nonsmoker.   After 10 years, the risk of lung cancer is cut in half and the risk of other cancers decreases significantly.   After 15 years, the risk of coronary heart disease drops, usually to the level of a nonsmoker.   If you are pregnant, quitting smoking will improve your chances of having a healthy baby.   The people you live with, especially your children, will be healthier.   You will have extra money to spend on things other than cigarettes.  FIVE KEYS TO QUITTING Studies have shown that these 5 steps will help you quit smoking and quit for good. You have the best chances of quitting if you use them together: 1. Get ready.  2. Get  support and encouragement.  3. Learn new skills and behaviors.  4. Get medicine to reduce your nicotine addiction and use it correctly.  5. Be prepared for relapse or difficult situations. Be determined to continue trying to quit, even if you do not succeed at first.  1. GET READY  Set a quit date.   Change your environment.   Get  rid of ALL cigarettes, ashtrays, matches, and lighters in your home, car, and place of work.   Do not let people smoke in your home.   Review your past attempts to quit. Think about what worked and what did not.   Once you quit, do not smoke. NOT EVEN A PUFF!  2. GET SUPPORT AND ENCOURAGEMENT Studies have shown that you have a better chance of being successful if you have help. You can get support in many ways.  Tell your family, friends, and coworkers that you are going to quit and need their support. Ask them not to smoke around you.   Talk to your caregivers (doctor, dentist, nurse, pharmacist, psychologist, and/or smoking counselor).   Get individual, group, or telephone counseling and support. The more counseling you have, the better your chances are of quitting. Programs are available at [employer] and health centers. Call your local health department for information about programs in your area.   Spiritual beliefs and practices may help some smokers quit.   Quit meters are Insurance underwriter that keep track of quit statistics, such as amount of "quit-time," cigarettes not smoked, and money saved.   Many smokers find one or more of the many self-help books available useful in helping them quit and stay off tobacco.  3. LEARN NEW SKILLS AND BEHAVIORS  Try to distract yourself from urges to smoke. Talk to someone, go for a walk, or occupy your time with a task.   When you first try to quit, change your routine. Take a different route to work. Drink tea instead of coffee. Eat breakfast in a different place.   Do  something to reduce your stress. Take a hot bath, exercise, or read a book.   Plan something enjoyable to do every day. Reward yourself for not smoking.   Explore interactive web-based programs that specialize in helping you quit.  4. GET MEDICINE AND USE IT CORRECTLY Medicines can help you stop smoking and decrease the urge to smoke. Combining medicine with the above behavioral methods and support can quadruple your chances of successfully quitting smoking. The U.S. Food and Drug Administration (FDA) has approved 7 medicines to help you quit smoking. These medicines fall into 3 categories.  Nicotine replacement therapy (delivers nicotine to your body without the negative effects and risks of smoking):   Nicotine gum: Available over-the-counter.   Nicotine lozenges: Available over-the-counter.   Nicotine inhaler: Available by prescription.   Nicotine nasal spray: Available by prescription.   Nicotine skin patches (transdermal): Available by prescription and over-the-counter.   Antidepressant medicine (helps people abstain from smoking, but how this works is unknown):   Bupropion sustained-release (SR) tablets: Available by prescription.   Nicotinic receptor partial agonist (simulates the effect of nicotine in your brain):   Varenicline tartrate tablets: Available by prescription.   Ask your caregiver for advice about which medicines to use and how to use them. Carefully read the information on the package.   Everyone who is trying to quit may benefit from using a medicine. If you are pregnant or trying to become pregnant, nursing an infant, you are under age 66, or you smoke fewer than 10 cigarettes per day, talk to your caregiver before taking any nicotine replacement medicines.   You should stop using a nicotine replacement product and call your caregiver if you experience nausea, dizziness, weakness, vomiting, fast or irregular heartbeat, mouth problems with the lozenge or gum, or  redness or swelling of  the skin around the patch that does not go away.   Do not use any other product containing nicotine while using a nicotine replacement product.   Talk to your caregiver before using these products if you have diabetes, heart disease, asthma, stomach ulcers, you had a recent heart attack, you have high blood pressure that is not controlled with medicine, a history of irregular heartbeat, or you have been prescribed medicine to help you quit smoking.  5. BE PREPARED FOR RELAPSE OR DIFFICULT SITUATIONS  Most relapses occur within the first 3 months after quitting. Do not be discouraged if you start smoking again. Remember, most people try several times before they finally quit.   You may have symptoms of withdrawal because your body is used to nicotine. You may crave cigarettes, be irritable, feel very hungry, cough often, get headaches, or have difficulty concentrating.   The withdrawal symptoms are only temporary. They are strongest when you first quit, but they will go away within 10 to 14 days.  Here are some difficult situations to watch for:  Alcohol. Avoid drinking alcohol. Drinking lowers your chances of successfully quitting.   Caffeine. Try to reduce the amount of caffeine you consume. It also lowers your chances of successfully quitting.   Other smokers. Being around smoking can make you want to smoke. Avoid smokers.   Weight gain. Many smokers will gain weight when they quit, usually less than 10 pounds. Eat a healthy diet and stay active. Do not let weight gain distract you from your main goal, quitting smoking. Some medicines that help you quit smoking may also help delay weight gain. You can always lose the weight gained after you quit.   Bad mood or depression. There are a lot of ways to improve your mood other than smoking.  If you are having problems with any of these situations, talk to your caregiver. SPECIAL SITUATIONS AND CONDITIONS Studies suggest  that everyone can quit smoking. Your situation or condition can give you a special reason to quit.  Pregnant women/new mothers: By quitting, you protect your baby's health and your own.   Hospitalized patients: By quitting, you reduce health problems and help healing.   Heart attack patients: By quitting, you reduce your risk of a second heart attack.   Lung, head, and neck cancer patients: By quitting, you reduce your chance of a second cancer.   Parents of children and adolescents: By quitting, you protect your children from illnesses caused by secondhand smoke.  QUESTIONS TO THINK ABOUT Think about the following questions before you try to stop smoking. You may want to talk about your answers with your caregiver.  Why do you want to quit?   If you tried to quit in the past, what helped and what did not?   What will be the most difficult situations for you after you quit? How will you plan to handle them?   Who can help you through the tough times? Your family? Friends? Caregiver?   What pleasures do you get from smoking? What ways can you still get pleasure if you quit?  Here are some questions to ask your caregiver:  How can you help me to be successful at quitting?   What medicine do you think would be best for me and how should I take it?   What should I do if I need more help?   What is smoking withdrawal like? How can I get information on withdrawal?  Quitting takes hard work  and a lot of effort, but you can quit smoking. FOR MORE INFORMATION  Smokefree.gov (Inrails.tn) provides free, accurate, evidence-based information and professional assistance to help support the immediate and long-term needs of people trying to quit smoking. Document Released: 05/14/2001 Document Revised: 01/30/2011 Document Reviewed: 03/06/2009 [facility] Patient Information 2012 [place].

## 2011-07-04 NOTE — Assessment & Plan Note (Signed)
Pressure elevated today at 149/89.  On chart review he is approximately under good control 50% of the time. I encouraged [name] to continue to lose weight and exercise. If his blood pressure is elevated at our next clinic visit in 4 months, I will increase his valsartan to 80 mg daily.

## 2011-07-04 NOTE — Assessment & Plan Note (Signed)
As his last fasting lipid panel was about 10 months ago we will check it again today. I anticipate making any changes as his lipids look fantastic at his last visit. Continue rosuvastatin

## 2011-07-04 NOTE — Assessment & Plan Note (Signed)
[name] has continued his excellent diabetes management. His A1c today is 6.3. Since he has not reported any episodes of hypoglycemia I will maintain him on his current regimen of insulin and metformin. He received his diabetic eye exam 6 months ago per report of the patient. Pneumovax and influenza vaccines given today. We will check his lipid panel. He is on an ARB so I will not check a microalbumin. -- Followup with me in 3-4 months

## 2011-07-05 LAB — LIPID PANEL
Cholesterol: 88 mg/dL (ref 0–200)
HDL: 32 mg/dL — ABNORMAL LOW (ref >39)
LDL Cholesterol: 37 mg/dL (ref 0–99)
Total CHOL/HDL Ratio: 2.8 Ratio
Triglycerides: 96 mg/dL (ref <150)
VLDL: 19 mg/dL (ref 0–40)

## 2011-07-05 LAB — COMPLETE METABOLIC PANEL WITH GFR
ALT: 42 U/L (ref 0–53)
AST: 53 U/L — ABNORMAL HIGH (ref 0–37)
Albumin: 4 g/dL (ref 3.5–5.2)
Alkaline Phosphatase: 44 U/L (ref 39–117)
BUN: 12 mg/dL (ref 6–23)
CO2: 25 mEq/L (ref 19–32)
Calcium: 8.7 mg/dL (ref 8.4–10.5)
Chloride: 105 mEq/L (ref 96–112)
Creat: 1.12 mg/dL (ref 0.50–1.35)
GFR, Est African American: 82 mL/min
GFR, Est Non African American: 71 mL/min
Glucose, Bld: 84 mg/dL (ref 70–99)
Potassium: 4.5 mEq/L (ref 3.5–5.3)
Sodium: 139 mEq/L (ref 135–145)
Total Bilirubin: 0.6 mg/dL (ref 0.3–1.2)
Total Protein: 7 g/dL (ref 6.0–8.3)

## 2011-07-05 LAB — CBC
HCT: 40.5 % (ref 39.0–52.0)
Hemoglobin: 14 g/dL (ref 13.0–17.0)
MCH: 25 pg — ABNORMAL LOW (ref 26.0–34.0)
MCHC: 34.6 g/dL (ref 30.0–36.0)
MCV: 72.5 fL — ABNORMAL LOW (ref 78.0–100.0)
Platelets: 261 10*3/uL (ref 150–400)
RBC: 5.59 MIL/uL (ref 4.22–5.81)
RDW: 16 % — ABNORMAL HIGH (ref 11.5–15.5)
WBC: 7.1 10*3/uL (ref 4.0–10.5)

## 2011-07-08 ENCOUNTER — Encounter: Payer: Self-pay | Admitting: Internal Medicine

## 2011-07-10 ENCOUNTER — Ambulatory Visit (AMBULATORY_SURGERY_CENTER): Payer: Self-pay | Admitting: *Deleted

## 2011-07-10 VITALS — Ht 72.0 in | Wt 260.2 lb

## 2011-07-10 DIAGNOSIS — Z1211 Encounter for screening for malignant neoplasm of colon: Secondary | ICD-10-CM

## 2011-07-11 ENCOUNTER — Telehealth: Payer: Self-pay | Admitting: Internal Medicine

## 2011-07-11 NOTE — Telephone Encounter (Signed)
[name] has put the order in for the CPAP, and it is being processed per the Geophysicist/field seismologist.

## 2011-07-19 ENCOUNTER — Encounter: Payer: Self-pay | Admitting: Gastroenterology

## 2011-07-22 NOTE — Progress Notes (Signed)
Addended by: Lowry Ram on: 07/22/2011 08:04 AM   Modules accepted: Level of Service

## 2011-08-28 ENCOUNTER — Ambulatory Visit (INDEPENDENT_AMBULATORY_CARE_PROVIDER_SITE_OTHER): Payer: Self-pay | Admitting: Dietician

## 2011-08-28 ENCOUNTER — Telehealth: Payer: Self-pay | Admitting: Dietician

## 2011-08-28 ENCOUNTER — Other Ambulatory Visit: Payer: Self-pay | Admitting: *Deleted

## 2011-08-28 ENCOUNTER — Encounter: Payer: Self-pay | Admitting: Dietician

## 2011-08-28 DIAGNOSIS — E119 Type 2 diabetes mellitus without complications: Secondary | ICD-10-CM

## 2011-08-28 MED ORDER — ATORVASTATIN CALCIUM 20 MG PO TABS: 20.0000 mg | ORAL_TABLET | Freq: Every day | ORAL | Status: DC

## 2011-08-28 NOTE — Telephone Encounter (Signed)
Lipitor rx called to [facility].

## 2011-08-28 NOTE — Telephone Encounter (Signed)
Requested eye exam records be faxed to our office. Was informed that patient should call [facility] [employer] to see if they can help him pay for his yearly eye exam again this year which will be due in June. Information and phone number mailed to patient today. [employer] phone number is [phone]

## 2011-08-28 NOTE — Progress Notes (Signed)
Diabetes Self-Management Training (DSMT)  Initial Visit  08/28/2011 [name], identified by name and date of birth, is a 61 y.o. male with Type 2 Diabetes. Year of diabetes diagnosis: not sure Other persons present: no  ASSESSMENT Patient concerns are Monitoring and Healthy Lifestyle.  There were no vitals taken for this visit. There is no height or weight on file to calculate BMI. Lab Results  Component Value Date   LDLCALC 37 07/04/2011   Lab Results  Component Value Date   HGBA1C 6.3 07/04/2011    Labs reviewed.  DIABETES BUNDLE: A1C in past 6 months? Yes.  Less than 7%? Yes LDL in past year? Yes.  Less than 100 mg/dL? Yes Microalbumin ratio in past year? No. Patient taking ACE or ARB? Yes. Blood pressure less than 130/80? No.  Sent note to MD. Foot exam in last year? No- will do today Eye exam in past year? Yes. Tobacco use? Yes.  Smoking cessation offered? Yes Pneumovax? Yes Flu vaccine? Yes Asprin? Yes  Family history of diabetes: Yes Support systems: friends Special needs: None, lack of financial stability Prior DM Education: Yes Patients belief/attitude about diabetes: Diabetes can be controlled. Self foot exams daily: Yes Diabetes Complications: None  Medications See Medications list.  Has adequate knowledge   Exercise Plan Doing walking for 60 minutesa day.   Self-Monitoring Monitor: brought freestyle meter and wavesense presto strips with him today Frequency of testing: stopped testing Breakfast: 157 today in office after breakfast and insulin   Meal Planning di not assess today per patients goals for visit   Assessment comments: patient came for meter instruction using alternative testing. He was abel to successfully demonstrate ability to perform alternative testing suing wave sense meter( office meter) . Patient reports he has a wave sense meter at home, knows he can purchase strips at [facility] for 6$. Foot education done today.  Will call Dr. Gershon Crane to request eye exam report from ~ 6 months ago.   INDIVIDUAL DIABETES EDUCATION PLAN:  Monitoring Chronic complications Goal setting _______________________________________________________________________  Intervention TOPICS COVERED TODAY:  Monitoring  Taught/evaluated SMBG with wavesense presto meter. Chronic complications  Assessed and discussed foot care and prevention of foot problems need for smoking cessation Goal setting  Lifestyle issues that need to be addressed for better diabetes care Review risk of smoking and offered smoking cessation  PATIENTS GOALS/PLAN (copy and paste in patient instructions so patient receives a copy): 1.  Learning Objective:       State importance of quitting smoking to preventing amputations and symptoms of poor circulation in legs 2.  Behavioral Objective:         Monitoring: To identify blood glucose trends, I will test my blood glucose 2x day Sometimes 25% Problem Solving: To improve my blood glucose control, I will know how to do alternative testing, which fingers to use and how and how to adjhust lancing device accordingly.  Never 0% Reducing Risk: To decrease the risk for complications, I will stop smoking and do foot check daily  Half of the time 50% 0 % with smoking, 100% on foot checks  Personalized Follow-Up Plan for Ongoing Self Management Support:  [place], friends and CDE visits ______________________________________________________________________   Outcomes Expected outcomes: Demonstrated interest in learning.Expect positive changes in lifestyle.  Self-care Barriers: Lack of transportation, Lack of material resources  Education material provided: yes  Patient to contact team via Phone if problems or questions.  Time in: 1030     Time  out: 1100  Future DSMT - PRN   [name], [name]

## 2011-08-29 ENCOUNTER — Encounter: Payer: Self-pay | Admitting: Internal Medicine

## 2011-08-29 DIAGNOSIS — H269 Unspecified cataract: Secondary | ICD-10-CM | POA: Insufficient documentation

## 2011-09-08 ENCOUNTER — Encounter: Payer: Self-pay | Admitting: Internal Medicine

## 2011-10-16 ENCOUNTER — Encounter: Payer: Self-pay | Admitting: Internal Medicine

## 2011-10-16 ENCOUNTER — Ambulatory Visit (INDEPENDENT_AMBULATORY_CARE_PROVIDER_SITE_OTHER): Payer: Self-pay | Admitting: Internal Medicine

## 2011-10-16 VITALS — BP 136/79 | HR 82 | Temp 98.5°F | Ht 72.0 in | Wt 254.5 lb

## 2011-10-16 DIAGNOSIS — Z79899 Other long term (current) drug therapy: Secondary | ICD-10-CM

## 2011-10-16 DIAGNOSIS — E785 Hyperlipidemia, unspecified: Secondary | ICD-10-CM

## 2011-10-16 DIAGNOSIS — F17200 Nicotine dependence, unspecified, uncomplicated: Secondary | ICD-10-CM

## 2011-10-16 DIAGNOSIS — E669 Obesity, unspecified: Secondary | ICD-10-CM

## 2011-10-16 DIAGNOSIS — L84 Corns and callosities: Secondary | ICD-10-CM

## 2011-10-16 DIAGNOSIS — I10 Essential (primary) hypertension: Secondary | ICD-10-CM

## 2011-10-16 DIAGNOSIS — L723 Sebaceous cyst: Secondary | ICD-10-CM

## 2011-10-16 DIAGNOSIS — Q181 Preauricular sinus and cyst: Secondary | ICD-10-CM

## 2011-10-16 DIAGNOSIS — Z9889 Other specified postprocedural states: Secondary | ICD-10-CM

## 2011-10-16 DIAGNOSIS — E66811 Obesity, class 1: Secondary | ICD-10-CM

## 2011-10-16 DIAGNOSIS — Z86010 Personal history of colonic polyps: Secondary | ICD-10-CM

## 2011-10-16 DIAGNOSIS — E119 Type 2 diabetes mellitus without complications: Secondary | ICD-10-CM

## 2011-10-16 LAB — GLUCOSE, CAPILLARY: Glucose-Capillary: 112 mg/dL — ABNORMAL HIGH (ref 70–99)

## 2011-10-16 LAB — POCT GLYCOSYLATED HEMOGLOBIN (HGB A1C): Hemoglobin A1C: 5.6

## 2011-10-16 NOTE — Patient Instructions (Signed)
We will refer you to a podiatrist for your foot callus. They will call you with an appointment.  Cut your statin in half.    We will watch your cyst. If its not significantly better in 2 weeks, call us and we give you a phone referral to dermatology.   Return to clinic in 3 months for your regular diabetes care.  Please bring all your medications to your next clinic appointment.    Diabetes and Exercise Regular exercise is important and can help:   Control blood glucose (sugar).   Decrease blood pressure.    Control blood lipids (cholesterol, triglycerides).   Improve overall health.  BENEFITS FROM EXERCISE  Improved fitness.   Improved flexibility.   Improved endurance.   Increased bone density.   Weight control.   Increased muscle strength.   Decreased body fat.   Improvement of the body's use of insulin, a hormone.   Increased insulin sensitivity.   Reduction of insulin needs.   Reduced stress and tension.   Helps you feel better.  People with diabetes who add exercise to their lifestyle gain additional benefits, including:  Weight loss.   Reduced appetite.   Improvement of the body's use of blood glucose.   Decreased risk factors for heart disease:   Lowering of cholesterol and triglycerides.   Raising the level of good cholesterol (high-density lipoproteins, HDL).   Lowering blood sugar.   Decreased blood pressure.  TYPE 1 DIABETES AND EXERCISE  Exercise will usually lower your blood glucose.   If blood glucose is greater than 240 mg/dl, check urine ketones. If ketones are present, do not exercise.   Location of the insulin injection sites may need to be adjusted with exercise. Avoid injecting insulin into areas of the body that will be exercised. For example, avoid injecting insulin into:   The arms when playing tennis.   The legs when jogging. For more information, discuss this with your caregiver.   Keep a record of:   Food intake.    Type and amount of exercise.   Expected peak times of insulin action.   Blood glucose levels.  Do this before, during, and after exercise. Review your records with your caregiver. This will help you to develop guidelines for adjusting food intake and insulin amounts.  TYPE 2 DIABETES AND EXERCISE  Regular physical activity can help control blood glucose.   Exercise is important because it may:   Increase the body's sensitivity to insulin.   Improve blood glucose control.   Exercise reduces the risk of heart disease. It decreases serum cholesterol and triglycerides. It also lowers blood pressure.   Those who take insulin or oral hypoglycemic agents should watch for signs of hypoglycemia. These signs include dizziness, shaking, sweating, chills, and confusion.   Body water is lost during exercise. It must be replaced. This will help to avoid loss of body fluids (dehydration) or heat stroke.  Be sure to talk to your caregiver before starting an exercise program to make sure it is safe for you. Remember, any activity is better than none.  Document Released: 08/10/2003 Document Revised: 05/09/2011 Document Reviewed: 11/24/2008 [facility] Patient Information 2012 [place].

## 2011-10-16 NOTE — Assessment & Plan Note (Signed)
[name] tells me that he tried to followup with his gastroenterologist, but they would not take his insurance. He has not been back for repeat colonoscopy. I had my nurse call over to Dr. Kelby Fam office. She states there was a mixup and the patient will be able to get his colonoscopy after all. They will call the patient to schedule his appointment. They should take the orange card. I impressed upon [name] the importance of having his colonoscopy done as he has had 3 colonoscopies approximately every 2 years with polypectomy each time. He is now 2 years after his last colonoscopy.

## 2011-10-16 NOTE — Progress Notes (Signed)
Subjective:     Patient ID: [name], male   DOB: July 13, 1950, 62 y.o.   MRN: [id number]  Diabetes He presents for his follow-up diabetic visit. He has type 2 diabetes mellitus. His disease course has been stable. There are no hypoglycemic associated symptoms. Pertinent negatives for hypoglycemia include no confusion, dizziness, headaches, nervousness/anxiousness, pallor, sleepiness, speech difficulty, sweats or tremors. (The patient states that he had a 1 hypoglycemic event in the past. He denies any recent hypoglycemic events since our last visit.) Pertinent negatives for diabetes include no blurred vision, no chest pain, no fatigue, no foot paresthesias, no foot ulcerations, no polydipsia, no polyphagia, no polyuria, no visual change, no weakness and no weight loss. Pertinent negatives for hypoglycemia complications include no blackouts and no hospitalization. Symptoms are stable. Diabetic complications include impotence. Pertinent negatives for diabetic complications include no autonomic neuropathy, CVA, heart disease, nephropathy or peripheral neuropathy. Risk factors for coronary artery disease include male sex, diabetes mellitus, dyslipidemia, hypertension, tobacco exposure and sedentary lifestyle. Current diabetic treatment includes insulin injections and oral agent (monotherapy). He is compliant with treatment all of the time. He is currently taking insulin pre-breakfast and pre-dinner. Insulin injections are given by patient. His weight is stable. He has had a previous visit with a dietician. He never participates in exercise. He monitors blood glucose at home 1-2 x per week. Blood glucose monitoring compliance is poor. His home blood glucose trend is decreasing steadily. An ACE inhibitor/angiotensin II receptor blocker is being taken. He does not see a podiatrist.Eye exam is current.  Foot Injury  Incident onset: States has painful callus formation on the bottom of the right foot for the past  3 months. The incident occurred at work. There was no injury mechanism. The pain is present in the right foot. The quality of the pain is described as aching. The pain is mild. The pain has been intermittent since onset. Associated symptoms comments: Worse with pressure on bottom of foot. 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 The symptoms are aggravated by weight bearing. He has tried non-weight bearing for the symptoms. The treatment provided no relief.  Rash This is a new (He has noticed a swelling under the skin of his  left ear) problem. The current episode started in the past 7 days. The problem has been gradually improving since onset. The rash is characterized by swelling and scaling. He was exposed to nothing. Pertinent negatives include no diarrhea, fatigue, fever or shortness of breath. Past treatments include nothing. The treatment provided mild relief.  Hypertension This is a chronic problem. The current episode started more than 1 year ago. The problem has been waxing and waning since onset. The problem is controlled. Pertinent negatives include no blurred vision, chest pain, headaches, peripheral edema, shortness of breath or sweats. There are no associated agents to hypertension. Risk factors for coronary artery disease include diabetes mellitus, obesity, male gender, sedentary lifestyle and smoking/tobacco exposure. The current treatment provides significant improvement. Compliance problems include exercise (Also states that he has not been using his CPAP machine becaue it drys out his mouth.).  There is no history of CVA.     he would like referral for a podiatrist.He has no ulcers or breaks in his skin wounds or trauma that he knows about.   [name] tells me that he is not seen a gastroenterologist. He went to Dr. Kelby Fam office and they told  him they did not accept his insurance. He denies any weight loss,  fatigue, blood in his stool, or melena.   Review of Systems  Constitutional: Negative for fever, chills, weight loss, diaphoresis, fatigue and unexpected weight change.  HENT: Negative for hearing loss, ear pain and ear discharge.   Eyes: Negative for blurred vision and visual disturbance.  Respiratory: Negative for chest tightness, shortness of breath and wheezing.   Cardiovascular: Negative for chest pain.  Gastrointestinal: Negative for diarrhea, constipation and blood in stool.  Genitourinary: Positive for impotence. Negative for dysuria, polyuria and frequency.  Musculoskeletal: Positive for arthralgias.  Skin: Negative for pallor and rash.  Neurological: Negative for dizziness, tremors, syncope, speech difficulty, weakness and headaches.  Hematological: Negative for polydipsia, polyphagia and adenopathy.  Psychiatric/Behavioral: Negative for confusion. The patient is not nervous/anxious.        Objective:   Physical Exam  Vitals reviewed. Constitutional: He appears well-developed and well-nourished. No distress.  HENT:  Head: Atraumatic.  Right Ear: External ear normal.  Eyes: Pupils are equal, round, and reactive to light.  Neck: No JVD present.  Cardiovascular: Normal rate and regular rhythm.   Pulmonary/Chest: Effort normal and breath sounds normal.  Abdominal: Soft. Bowel sounds are normal.  Musculoskeletal: He exhibits no edema.  Lymphadenopathy:    He has no cervical adenopathy.  Neurological: He is alert. No cranial nerve deficit.  Skin: Skin is warm, dry and intact. No abrasion and no rash noted. He is not diaphoretic.       There is a 4 x 6 cm hard plaque of callus on the plantar surface of the left foot. This is underlying the fifth metatarsal of the mid sole. The skin is intact. This is nonpainful to the touch.  He has a approximately 1 cm in diameter subcutaneous nodule just inferior to the lobe of the left ear. This is freely mobile and nonpainful to touch. It  is not erythematous warm or edematous. There is a small amount of desquamation on the surface of the nodule. There is no central pore.  Psychiatric: He has a normal mood and affect. His behavior is normal.       Assessment:    See problem oriented charting.

## 2011-10-17 ENCOUNTER — Encounter: Payer: Self-pay | Admitting: Internal Medicine

## 2011-10-20 DIAGNOSIS — E669 Obesity, unspecified: Secondary | ICD-10-CM | POA: Insufficient documentation

## 2011-10-20 NOTE — Assessment & Plan Note (Addendum)
Blood pressure control is at goal today. We will not increase valsartan. I have encouraged cardio exercise to improve overall health which will help with BP as well.

## 2011-10-20 NOTE — Assessment & Plan Note (Signed)
Advised weight loss, smoking cessation and exercise

## 2011-10-20 NOTE — Assessment & Plan Note (Addendum)
Lipid control is enviable and below goal. Since there is no data to support a lower LDL than 70, we will have him cut his statin in 1/2 to save money. He will require repeat lipid panel in about 3-6 months.

## 2011-10-20 NOTE — Assessment & Plan Note (Signed)
[name] has continue his excellent diabetes control.  His A1c is 5.6 today.  I was concerned a the drop in A1c, because of the risk of hypoglycemia, but he assures me he has not had any. He can identify the symptoms of hypoglycemia (confusion, sweating, nausea, lightheadedness, tunnel vision) from prior episodes.  HM is UTD and will have him f/u in 3 months. Right now pressing issue is GI follow-up

## 2011-10-20 NOTE — Assessment & Plan Note (Signed)
Since it is asymptomatic and resolving on its own, we will watch the cyst. If its not significantly better in 2 weeks, [name] is to call us and we will provide a phone referral to dermatology.

## 2011-10-20 NOTE — Assessment & Plan Note (Signed)
Counceling given as to decreasing smoking / smoking cessation. Gave referral to 1-800-quit now so that he could get nicotine patches and further resources to quit smoking.

## 2011-10-22 ENCOUNTER — Encounter: Payer: Self-pay | Admitting: Gastroenterology

## 2011-11-11 ENCOUNTER — Ambulatory Visit (AMBULATORY_SURGERY_CENTER): Payer: Self-pay | Admitting: *Deleted

## 2011-11-11 VITALS — Ht 72.0 in | Wt 256.0 lb

## 2011-11-11 DIAGNOSIS — Z86010 Personal history of colonic polyps: Secondary | ICD-10-CM

## 2011-11-11 DIAGNOSIS — Z1211 Encounter for screening for malignant neoplasm of colon: Secondary | ICD-10-CM

## 2011-11-18 ENCOUNTER — Encounter: Payer: Self-pay | Admitting: Gastroenterology

## 2011-11-18 ENCOUNTER — Ambulatory Visit (AMBULATORY_SURGERY_CENTER): Payer: Self-pay | Admitting: Gastroenterology

## 2011-11-18 VITALS — BP 137/78 | HR 62 | Temp 97.9°F | Resp 18 | Ht 74.0 in | Wt 256.0 lb

## 2011-11-18 DIAGNOSIS — D126 Benign neoplasm of colon, unspecified: Secondary | ICD-10-CM

## 2011-11-18 DIAGNOSIS — Z86010 Personal history of colonic polyps: Secondary | ICD-10-CM

## 2011-11-18 DIAGNOSIS — D131 Benign neoplasm of stomach: Secondary | ICD-10-CM

## 2011-11-18 DIAGNOSIS — Z1211 Encounter for screening for malignant neoplasm of colon: Secondary | ICD-10-CM

## 2011-11-18 LAB — GLUCOSE, CAPILLARY
Glucose-Capillary: 201 mg/dL — ABNORMAL HIGH (ref 70–99)
Glucose-Capillary: 81 mg/dL (ref 70–99)
Glucose-Capillary: 95 mg/dL (ref 70–99)

## 2011-11-18 MED ORDER — SODIUM CHLORIDE 0.9 % IV SOLN: 500.0000 mL | INTRAVENOUS | Status: DC

## 2011-11-18 NOTE — Op Note (Signed)
[facility] [address] [place], [place]  [id number]  COLONOSCOPY PROCEDURE REPORT  PATIENT:  [name], [name]  MR#:  [id number] BIRTHDATE:  [birth year], 60 yrs. old  GENDER:  male ENDOSCOPIST:  Sandy Salaam. [name], MD REF. BY:  Jay Schlichter, M.D. PROCEDURE DATE:  11/18/2011 PROCEDURE:  Colonoscopy with biopsy, Colonoscopy with submucosal injection ASA CLASS:  Class II INDICATIONS:  Screening Recurrent ascending colon polyp - last exam 2011 MEDICATIONS:   MAC sedation, administered by CRNA propofol 140mg IV  DESCRIPTION OF PROCEDURE:   After the risks benefits and alternatives of the procedure were thoroughly explained, informed consent was obtained.  Digital rectal exam was performed and revealed no abnormalities.   The LB [id number] endoscope was introduced through the anus and advanced to the cecum, which was identified by both the appendix and ileocecal valve, without limitations.  The quality of the prep was excellent, using MoviPrep.  The instrument was then slowly withdrawn as the colon was fully examined. <<PROCEDUREIMAGES>>  FINDINGS:  A mass was found in the distal transverse colon. 3-4cm slightly depressed mass in mid to distal colon - multiple biopsies were taken (see image5 and image6). Area was marked with 5cc submucosal injection of "spot"  This was otherwise a normal examination of the colon (see image3 and image7).   Retroflexed views in the rectum revealed no abnormalities.    The time to cecum =  1) 1.75  minutes. The scope was then withdrawn in  1) 7.75  minutes from the cecum and the procedure completed. COMPLICATIONS:  None ENDOSCOPIC IMPRESSION: 1) Mass in the distal transverse colon 2) Otherwise normal examination RECOMMENDATIONS: 1) Await biopsy results REPEAT EXAM:   You will receive a letter from [name] in 1-2 weeks, after reviewing the final pathology, with followup recommendations.  ______________________________ Sandy Salaam  [name], MD  CC:  n. eSIGNED:   Sandy Salaam. Laquana Villari at 11/18/2011 02:51 PM  [name], [id number]

## 2011-11-18 NOTE — Patient Instructions (Addendum)
Discharge instructions given with verbal understanding. Resume previous medications.YOU HAD AN ENDOSCOPIC PROCEDURE TODAY AT THE [facility]: Refer to the procedure report that was given to you for any specific questions about what was found during the examination.  If the procedure report does not answer your questions, please call your gastroenterologist to clarify.  If you requested that your care partner not be given the details of your procedure findings, then the procedure report has been included in a sealed envelope for you to review at your convenience later.  YOU SHOULD EXPECT: Some feelings of bloating in the abdomen. Passage of more gas than usual.  Walking can help get rid of the air that was put into your GI tract during the procedure and reduce the bloating. If you had a lower endoscopy (such as a colonoscopy or flexible sigmoidoscopy) you may notice spotting of blood in your stool or on the toilet paper. If you underwent a bowel prep for your procedure, then you may not have a normal bowel movement for a few days.  DIET: Your first meal following the procedure should be a light meal and then it is ok to progress to your normal diet.  A half-sandwich or bowl of soup is an example of a good first meal.  Heavy or fried foods are harder to digest and may make you feel nauseous or bloated.  Likewise meals heavy in dairy and vegetables can cause extra gas to form and this can also increase the bloating.  Drink plenty of fluids but you should avoid alcoholic beverages for 24 hours.  ACTIVITY: Your care partner should take you home directly after the procedure.  You should plan to take it easy, moving slowly for the rest of the day.  You can resume normal activity the day after the procedure however you should NOT DRIVE or use heavy machinery for 24 hours (because of the sedation medicines used during the test).    SYMPTOMS TO REPORT IMMEDIATELY: A gastroenterologist can be reached at  any hour.  During normal business hours, 8:30 AM to 5:00 PM Monday through Friday, call [phone].  After hours and on weekends, please call the GI answering service at [phone] who will take a message and have the physician on call contact you.   Following lower endoscopy (colonoscopy or flexible sigmoidoscopy):  Excessive amounts of blood in the stool  Significant tenderness or worsening of abdominal pains  Swelling of the abdomen that is new, acute  Fever of 100F or higher  FOLLOW UP: If any biopsies were taken you will be contacted by phone or by letter within the next 1-3 weeks.  Call your gastroenterologist if you have not heard about the biopsies in 3 weeks.  Our staff will call the home number listed on your records the next business day following your procedure to check on you and address any questions or concerns that you may have at that time regarding the information given to you following your procedure. This is a courtesy call and so if there is no answer at the home number and we have not heard from you through the emergency physician on call, we will assume that you have returned to your regular daily activities without incident.  SIGNATURES/CONFIDENTIALITY: You and/or your care partner have signed paperwork which will be entered into your electronic medical record.  These signatures attest to the fact that that the information above on your After Visit Summary has been reviewed and is understood.    Full responsibility of the confidentiality of this discharge information lies with you and/or your care-partner.  

## 2011-11-18 NOTE — Progress Notes (Signed)
Patient did not experience any of the following events: a burn prior to discharge; a fall within the facility; wrong site/side/patient/procedure/implant event; or a hospital transfer or hospital admission upon discharge from the facility. (G8907) Patient did not have preoperative order for IV antibiotic SSI prophylaxis. (G8918)  

## 2011-11-19 ENCOUNTER — Telehealth: Payer: Self-pay | Admitting: *Deleted

## 2011-11-19 NOTE — Telephone Encounter (Signed)
  Follow up Call-  Call back number 11/18/2011  Post procedure Call Back phone  # [phone]  Permission to leave phone message Yes     Patient questions:  Message left to call us if necessary.

## 2011-11-26 ENCOUNTER — Telehealth: Payer: Self-pay

## 2011-11-26 ENCOUNTER — Telehealth (INDEPENDENT_AMBULATORY_CARE_PROVIDER_SITE_OTHER): Payer: Self-pay

## 2011-11-26 ENCOUNTER — Telehealth: Payer: Self-pay | Admitting: Gastroenterology

## 2011-11-26 NOTE — Telephone Encounter (Signed)
[facility] does not accept the pts Orange card and he would be considered self pay. Pt cannot afford the copay and surgical payment. Pt wants to be seen somewhere else. Please advise.

## 2011-11-26 NOTE — Telephone Encounter (Signed)
Pt does not have any insurance. He has the indigent card from [facility] and [facility] does not take that. No other offices will accept this either. Call placed to [facility] for suggestions.

## 2011-11-26 NOTE — Telephone Encounter (Signed)
Called pt to give him the new pt appt with Dr Johney Maine for 7/1 arrive at 2:30 but the pt wanted to know if we participated with his insurance card of the [facility] clinic. I had Gayle from the front desk talk to him about the card which we do not participate with so the pt would be a private pay pt that means the pt would have to pay an amount the day of his office visit along with down payment for surgery. The pt doesn't want to see Dr Johney Maine if we don't participate with his card. I advised pt to call Dr Kelby Fam office to speak with his nurse to see what they could do for him and who they want to refer him to now. I will notify Dr Johney Maine.

## 2011-11-26 NOTE — Telephone Encounter (Signed)
I would check with the surgical group in [place] to see if they accept his insurance

## 2011-11-26 NOTE — Telephone Encounter (Signed)
Called Dr Kelby Fam nurse Laurine Blazer to let her know that I did speak with the pt this am and that the pt declined the appt for Monday b/c we do not participate with the pt's insurance card. I advised Robbin that we told the pt that we would still be glad to see the pt but he would be a private pay pt which means he would be responsible for some payment but we would work with him. Robbin will notify Dr [name].

## 2011-11-27 ENCOUNTER — Encounter: Payer: Self-pay | Admitting: Internal Medicine

## 2011-11-27 DIAGNOSIS — Z0000 Encounter for general adult medical examination without abnormal findings: Secondary | ICD-10-CM | POA: Insufficient documentation

## 2011-11-27 NOTE — Telephone Encounter (Signed)
Called and spoke with [facility]. For the orange card the pt needs to be seen at [facility] scheduled to be seen 12/16/11@11 :15am. Pt to bring his orange card with him to the appt. Pt aware of appt date and time. Records faxed to [facility] @[phone].

## 2011-12-02 ENCOUNTER — Ambulatory Visit (INDEPENDENT_AMBULATORY_CARE_PROVIDER_SITE_OTHER): Payer: Self-pay | Admitting: Surgery

## 2011-12-04 ENCOUNTER — Other Ambulatory Visit: Payer: Self-pay | Admitting: *Deleted

## 2011-12-04 MED ORDER — VALSARTAN 80 MG PO TABS: 40.0000 mg | ORAL_TABLET | Freq: Every day | ORAL | Status: DC

## 2011-12-09 NOTE — Telephone Encounter (Signed)
Diovan rx called to [place].

## 2011-12-18 ENCOUNTER — Ambulatory Visit (INDEPENDENT_AMBULATORY_CARE_PROVIDER_SITE_OTHER): Payer: Self-pay | Admitting: Internal Medicine

## 2011-12-18 ENCOUNTER — Encounter: Payer: Self-pay | Admitting: Internal Medicine

## 2011-12-18 VITALS — BP 149/85 | HR 63 | Temp 98.0°F | Ht 71.5 in | Wt 263.3 lb

## 2011-12-18 DIAGNOSIS — E785 Hyperlipidemia, unspecified: Secondary | ICD-10-CM

## 2011-12-18 DIAGNOSIS — Z9889 Other specified postprocedural states: Secondary | ICD-10-CM

## 2011-12-18 DIAGNOSIS — E119 Type 2 diabetes mellitus without complications: Secondary | ICD-10-CM

## 2011-12-18 DIAGNOSIS — I10 Essential (primary) hypertension: Secondary | ICD-10-CM

## 2011-12-18 DIAGNOSIS — Z86010 Personal history of colonic polyps: Secondary | ICD-10-CM

## 2011-12-18 NOTE — Progress Notes (Signed)
Patient ID: [name], male   DOB: 12-02-1950, 61 y.o.   MRN: [id number]  Subjective:   Patient ID: [name] male   DOB: 1950/11/07 61 y.o.   MRN: [id number]  HPI: [name] is a 61 y.o. with a past medical history as outlined below, who presents for a regular checkup.    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 Patient does not have abdominal pain or bloody stool. There is no significant change in his body weight recently. Patient has an appointment with the [facility] at 12/25/11.  Regarding his hypertension, patient reports that he missed blood pressure medications for one week due to a delayed refill. He already restart his blood pressure medication on 12/14/11. Today his blood pressure is 147/86.  Regarding his diabetes, his currently taking Humulin 70/30 and metformin. His recent A1c was 5.6 at 10/16/11. He did not have symptoms for hypoglycemia.  Regarding his hyperlipidemia, patient is currently taking Lipitor. His last LDL was 37 at 07/04/11. He did not have side effects, such as muscle pain.   Past Medical History  Diagnosis Date  . Diabetes mellitus   . Hyperlipidemia   . Hypertension   . Heart murmur   . Sleep apnea     CPAP   Current Outpatient Prescriptions  Medication Sig Dispense Refill  . Ascorbic Acid (VITAMIN C) 100 MG tablet Take by mouth daily.      Marland Kitchen aspirin 325 MG tablet Take 325 mg by mouth daily.      Marland Kitchen atorvastatin (LIPITOR) 20 MG tablet Take 1 tablet (20 mg total) by mouth at bedtime.  90 tablet  2  . insulin NPH-insulin regular (HUMULIN 70/30) (70-30) 100 UNIT/ML injection Take 30 units in the morning and 10 units in the evening.  80 mL  6  . Insulin Syringe-Needle U-100 (INSULIN SYRINGE .5CC/30GX5/16") 30G X 5/16" 0.5 ML MISC Use as directed to inject insulin.        . Lancets Thin MISC Use as directed to check blood sugar.       . metFORMIN (GLUCOPHAGE) 1000  MG tablet Take 1 tablet (1,000 mg total) by mouth 2 (two) times daily with a meal.  60 tablet  11  . Multiple Vitamins-Minerals (MULTIVITAMIN WITH MINERALS) tablet Take 1 tablet by mouth daily.      . sildenafil (VIAGRA) 50 MG tablet Take 1 tablet 30 minutes before intercourse.       . valsartan (DIOVAN) 80 MG tablet Take 0.5 tablets (40 mg total) by mouth daily. For blood pressure.  90 tablet  3   Current Facility-Administered Medications  Medication Dose Route Frequency Provider Last Rate Last Dose  . pneumococcal 23 valent vaccine (PNU-IMMUNE) injection 0.5 mL  0.5 mL Intramuscular Once Clarene Duke, MD       Family History  Problem Relation Age of Onset  . Diabetes Father   . Heart disease Father   . Colon cancer Neg Hx   . Diabetes Sister   . Heart disease Sister   . Heart disease Mother    History   Social History  . Marital Status: Legally Separated    Spouse Name: N/A    Number of Children: N/A  . Years of Education: N/A   Social History Main Topics  . Smoking status: Current Everyday Smoker -- 0.5 packs/day    Types: Cigarettes  . Smokeless tobacco: Never Used   Comment: faxing referral  to quitline when signed by patient  . Alcohol Use: Yes     LIQUOR OR WINE WEEKLY  . Drug Use: No  . Sexually Active: Not on file   Other Topics Concern  . Not on file   Social History Narrative   Financial assistance approved for 100% discount at [facility] and has [place]  August 08, 2009 5:48 PM*PATIENT WAS GIVEN DM CARD.Lela Sturdivant NT II  June 08, 2010 3:56 PM   Review of Systems: General: no fevers, chills, no changes in body weight, no changes in appetite Skin: no rash HEENT: no blurry vision, hearing changes or sore throat Pulm: no dyspnea, coughing, wheezing CV: no chest pain, palpitations, shortness of breath Abd: no nausea/vomiting, abdominal pain, diarrhea/constipation GU: no dysuria, hematuria, polyuria Ext: no arthralgias, myalgias Neuro: no  weakness, numbness, or tingling   Objective:  Physical Exam: Filed Vitals:   12/18/11 0929  BP: 147/86  Pulse: 66  Temp: 98 F (36.7 C)  TempSrc: Oral  Height: 5' 11.5" (1.816 m)  Weight: 263 lb 4.8 oz (119.432 kg)   general: resting in bed, not in acute distress HEENT: PERRL, EOMI, no scleral icterus Cardiac: S1/S2, RRR, No murmurs, gallops or rubs Pulm: Good air movement bilaterally, Clear to auscultation bilaterally, No rales, wheezing, rhonchi or rubs. Abd: Soft,  nondistended, nontender, no rebound pain, no organomegaly, BS present Ext: No rashes or edema, 2+DP/PT pulse bilaterally Musculoskeletal: No joint deformities, erythema, or stiffness, ROM full and nontender Skin: no rashes. No skin bruise. Neuro: alert and oriented X3, cranial nerves II-XII grossly intact, muscle strength 5/5 in all extremeties,  sensation to light touch intact.  Psych.: patient is not psychotic, no suicidal or hemocidal ideation.   Assessment & Plan:

## 2011-12-18 NOTE — Patient Instructions (Signed)
1. Please take all medications as prescribed.  3. If you have new symptoms arise, please call the clinic [phone]), or go to the ER immediately if symptoms are severe.

## 2011-12-18 NOTE — Assessment & Plan Note (Signed)
His recent LDL was 37 at 07/04/11. Patient is currently taking Lipitor. He did not have side effects, such as muscle pain. We'll continue current regimen.

## 2011-12-18 NOTE — Assessment & Plan Note (Signed)
Patient blood pressure is slightly elevated today. Blood pressure is 147/86. It is likely due to missing doses of his HTN medication recently (patient missed his blood pressure medication for a week and restarted at 12/14/11). Will not change his blood pressure medication today. We'll followup

## 2011-12-18 NOTE — Assessment & Plan Note (Signed)
It is well controlled. A1c was 5.6 at 10/16/11. We'll continue current regimen.

## 2011-12-18 NOTE — Assessment & Plan Note (Signed)
Patient had an abnormal colonoscopy recently. 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 Currently patient is asymptomatic. Patient has an appointment with the [facility] at 12/25/11. Will followup.

## 2011-12-25 ENCOUNTER — Encounter (INDEPENDENT_AMBULATORY_CARE_PROVIDER_SITE_OTHER): Payer: Self-pay | Admitting: Surgery

## 2011-12-25 ENCOUNTER — Ambulatory Visit (INDEPENDENT_AMBULATORY_CARE_PROVIDER_SITE_OTHER): Payer: PRIVATE HEALTH INSURANCE | Admitting: Surgery

## 2011-12-25 VITALS — BP 128/80 | HR 92 | Temp 98.2°F | Resp 20 | Ht 71.5 in | Wt 256.4 lb

## 2011-12-25 DIAGNOSIS — D126 Benign neoplasm of colon, unspecified: Secondary | ICD-10-CM

## 2011-12-25 DIAGNOSIS — K635 Polyp of colon: Secondary | ICD-10-CM

## 2011-12-25 HISTORY — DX: Polyp of colon: K63.5

## 2011-12-25 NOTE — Progress Notes (Signed)
Patient ID: [name], male   DOB: 05-24-1951, 61 y.o.   MRN: [id number]  Chief Complaint  Patient presents with  . Colon Polyps    HPI [name] is a 61 y.o. male.  Referred by 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.  He was found to have a 3-4 cm sessile mass in the distal transverse colon. This was biopsied and the biopsy showed high-grade dysplasia with no sign of malignancy. The patient reports no abdominal pain, no changes in bowel movements, no melena or hematochezia. He reports no recent weight loss. He is now referred for surgical evaluation. Past Medical History  Diagnosis Date  . Diabetes mellitus   . Hyperlipidemia   . Hypertension   . Heart murmur   . Sleep apnea     CPAP    Past Surgical History  Procedure Date  . Colonoscopy   . Ganglion cyst excision 20 YRS AGO    RT ARM    Family History  Problem Relation Age of Onset  . Diabetes Father   . Heart disease Father   . Colon cancer Neg Hx   . Diabetes Sister   . Heart disease Sister   . Heart disease Mother     Social History History  Substance Use Topics  . Smoking status: Current Everyday Smoker -- 0.5 packs/day    Types: Cigarettes  . Smokeless tobacco: Never Used   Comment: faxing referral to quitline when signed by patient  . Alcohol Use: Yes     LIQUOR OR WINE WEEKLY  1/2 ppd  No Known Allergies  Current Outpatient Prescriptions  Medication Sig Dispense Refill  . Ascorbic Acid (VITAMIN C) 100 MG tablet Take by mouth daily.      Marland Kitchen aspirin 325 MG tablet Take 325 mg by mouth daily.      Marland Kitchen atorvastatin (LIPITOR) 20 MG tablet Take 1 tablet (20 mg total) by mouth at bedtime.  90 tablet  2  . insulin NPH-insulin regular (HUMULIN 70/30) (70-30) 100 UNIT/ML injection Take 30 units in the morning and 10 units in the evening.  80 mL  6  . Insulin Syringe-Needle U-100 (INSULIN SYRINGE  .5CC/30GX5/16") 30G X 5/16" 0.5 ML MISC Use as directed to inject insulin.        . Lancets Thin MISC Use as directed to check blood sugar.       . metFORMIN (GLUCOPHAGE) 1000 MG tablet Take 1 tablet (1,000 mg total) by mouth 2 (two) times daily with a meal.  60 tablet  11  . Multiple Vitamins-Minerals (MULTIVITAMIN WITH MINERALS) tablet Take 1 tablet by mouth daily.      . sildenafil (VIAGRA) 50 MG tablet Take 1 tablet 30 minutes before intercourse.       . valsartan (DIOVAN) 80 MG tablet Take 0.5 tablets (40 mg total) by mouth daily. For blood pressure.  90 tablet  3   Current Facility-Administered Medications  Medication Dose Route Frequency Provider Last Rate Last Dose  . pneumococcal 23 valent vaccine (PNU-IMMUNE) injection 0.5 mL  0.5 mL Intramuscular Once Clarene Duke, MD        Review of Systems Review of Systems  Constitutional: Negative for fever, chills and unexpected weight change.  HENT: Negative for hearing loss, congestion, sore throat, trouble swallowing and voice change.   Eyes: Negative for visual disturbance.  Respiratory: Negative for cough and wheezing.  Cardiovascular: Negative for chest pain, palpitations and leg swelling.  Gastrointestinal: Negative for nausea, vomiting, abdominal pain, diarrhea, constipation, blood in stool, abdominal distention, anal bleeding and rectal pain.  Genitourinary: Negative for hematuria and difficulty urinating.  Musculoskeletal: Negative for arthralgias.  Skin: Negative for rash and wound.  Neurological: Negative for seizures, syncope, weakness and headaches.  Hematological: Negative for adenopathy. Does not bruise/bleed easily.  Psychiatric/Behavioral: Negative for confusion.    Blood pressure 128/80, pulse 92, temperature 98.2 F (36.8 C), temperature source Temporal, resp. rate 20, height 5' 11.5" (1.816 m), weight 256 lb 6.4 oz (116.302 kg).  Physical Exam Physical Exam WDWN in NAD HEENT:  EOMI, sclera anicteric Neck:   No masses, no thyromegaly Lungs:  CTA bilaterally; normal respiratory effort CV:  Regular rate and rhythm; no murmurs Abd:  +bowel sounds, soft, non-tender, no masses Ext:  Well-perfused; no edema Skin:  Warm, dry; no sign of jaundice  Data Reviewed Endoscopy report Path report  Assessment    Sessile polyp - distal transverse colon with high-grade dysplasia but no adenocarcinoma    Plan    Recommend laparoscopic-assisted left hemicolectomy with primary anastomosis. One day bowel prep.  The surgical procedure has been discussed with the patient.  Potential risks, benefits, alternative treatments, and expected outcomes have been explained.  All of the patient's questions at this time have been answered.  The likelihood of reaching the patient's treatment goal is good.  The patient understand the proposed surgical procedure and wishes to proceed.        Rodney Nelon K. 12/25/2011, 4:59 PM

## 2012-01-13 ENCOUNTER — Ambulatory Visit (INDEPENDENT_AMBULATORY_CARE_PROVIDER_SITE_OTHER): Payer: Self-pay | Admitting: Internal Medicine

## 2012-01-13 ENCOUNTER — Ambulatory Visit (HOSPITAL_COMMUNITY)
Admission: RE | Admit: 2012-01-13 | Discharge: 2012-01-13 | Disposition: A | Discharge status: Home / Self Care | Payer: Self-pay | Source: Ambulatory Visit | Attending: Internal Medicine | Admitting: Internal Medicine

## 2012-01-13 ENCOUNTER — Encounter: Payer: Self-pay | Admitting: *Deleted

## 2012-01-13 ENCOUNTER — Encounter: Payer: Self-pay | Admitting: Internal Medicine

## 2012-01-13 VITALS — BP 138/80 | HR 70 | Temp 97.8°F | Ht 71.5 in | Wt 258.1 lb

## 2012-01-13 DIAGNOSIS — M79609 Pain in unspecified limb: Secondary | ICD-10-CM

## 2012-01-13 DIAGNOSIS — M7989 Other specified soft tissue disorders: Secondary | ICD-10-CM | POA: Insufficient documentation

## 2012-01-13 DIAGNOSIS — M25562 Pain in left knee: Secondary | ICD-10-CM

## 2012-01-13 DIAGNOSIS — Z79899 Other long term (current) drug therapy: Secondary | ICD-10-CM

## 2012-01-13 DIAGNOSIS — I10 Essential (primary) hypertension: Secondary | ICD-10-CM

## 2012-01-13 DIAGNOSIS — M25569 Pain in unspecified knee: Secondary | ICD-10-CM

## 2012-01-13 DIAGNOSIS — M79606 Pain in leg, unspecified: Secondary | ICD-10-CM

## 2012-01-13 DIAGNOSIS — E119 Type 2 diabetes mellitus without complications: Secondary | ICD-10-CM

## 2012-01-13 LAB — POCT GLYCOSYLATED HEMOGLOBIN (HGB A1C): Hemoglobin A1C: 5.9

## 2012-01-13 LAB — GLUCOSE, CAPILLARY: Glucose-Capillary: 100 mg/dL — ABNORMAL HIGH (ref 70–99)

## 2012-01-13 MED ORDER — IBUPROFEN 200 MG PO TABS: 800.0000 mg | ORAL_TABLET | Freq: Three times a day (TID) | ORAL | Status: AC

## 2012-01-13 NOTE — Addendum Note (Signed)
Addended by: Othella Boyer on: 01/13/2012 12:26 PM   Modules accepted: Orders

## 2012-01-13 NOTE — Assessment & Plan Note (Signed)
Repeat blood pressure today 138/80, this complies with the new recommendation of blood pressure goal 140/80  Continue valsartan 40 mg daily

## 2012-01-13 NOTE — Assessment & Plan Note (Addendum)
Pain of popliteal region of left knee. No recent prolonged immobilization. No history of malignancy. 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 This puts him at risk for popliteal cyst.  Differential diagnosis includes popliteal cyst rupture, deep vein thrombosis, superficial thrombophlebitis  -Ultrasound -If negative for DVT, we'll treat with ibuprofen 800 mg by mouth 3 times a day for 7 days. -If positive for DVT, we'll initiate anticoagulation with Lovenox and Coumadin bridging  ADDENDUM:  Call received from radiology - no DVT or popliteal cyst, some fluid apparent, likely d/t arthritis - Patient to take ibuprofen 800mg  TID for 7 days, [name] may return if pain does not resolve

## 2012-01-13 NOTE — Progress Notes (Signed)
VASCULAR LAB PRELIMINARY  PRELIMINARY  PRELIMINARY  PRELIMINARY  Left lower extremity venous duplex completed.    Preliminary report:  Left:  No evidence of DVT, superficial thrombosis, or Baker's cyst.  [name], RVS 01/13/2012, 12:29 PM

## 2012-01-13 NOTE — Patient Instructions (Addendum)
-  Please be sure to have your knee ultrasound done today.  -I will call you with test results, and we will decide on pain management at that time.  If it is not a blood clot, we will do an anti-inflammatory medication scheduled for 3-5 days.  Please be sure to bring all of your medications with you to every visit.  Should you have any new or worsening symptoms, please be sure to call the clinic at [phone].  NOTE: The ultrasound suggests that your arthritis may be acting up, but you do not have a blood clot or cyst.  Take ibuprofen/motrin/advil 800mg  three times a day for 7 days ,scheduled, even if you are not in pain.

## 2012-01-13 NOTE — Progress Notes (Signed)
Pt walked into clinic with c/opain to left leg and knee.  Rates pain 8/10.  Onset of pain 2 days ago, noted when he got out of bed. Walks with limp.  Increase pain with ambulating. No history of this in past. Pt tried BC powder for pain with no relief.  Also tried icy hot with no relief.  Will see in clinic now.

## 2012-01-13 NOTE — Progress Notes (Signed)
Subjective:   Patient ID: [name] male   DOB: May 24, 1951 61 y.o.   MRN: [id number]  HPI: [name] is a 61 y.o. man with history of hypertension, diabetes, hyperlipidemia, and sleep apnea who presents for acute visit due to left knee pain  Left knee pain: Acute onset 2 days prior as he was getting out of bed, no history of trauma, no history of similar event in the past; 9-10/10 in severity, described as throbbing, difficult to weight-bear, it was persisting about the same until this morning as it started worsening; he has been using a cane to help with his balance as he has been having difficulty with his gait. Pain is worse with extension; no alleviating factors, he has tried soaking his leg in a warm bath, BC powders, icy hot with minimal to no relief. He is a Scientist, water quality at [employer], and he still has been going to work for the last 2 days. He walks quite a bit, as he does not have a car. He was recently referred to surgery for masses transverse colon with high-grade dysplasia but no signs of malignancy. No recent car accident. No recent prolonged immobilization.  Past Medical History  Diagnosis Date  . Diabetes mellitus   . Hyperlipidemia   . Hypertension   . Heart murmur   . Sleep apnea     CPAP   Current Outpatient Prescriptions  Medication Sig Dispense Refill  . Ascorbic Acid (VITAMIN C) 100 MG tablet Take by mouth daily.      Marland Kitchen aspirin 325 MG tablet Take 325 mg by mouth daily.      Marland Kitchen atorvastatin (LIPITOR) 20 MG tablet Take 1 tablet (20 mg total) by mouth at bedtime.  90 tablet  2  . insulin NPH-insulin regular (HUMULIN 70/30) (70-30) 100 UNIT/ML injection Take 30 units in the morning and 10 units in the evening.  80 mL  6  . Insulin Syringe-Needle U-100 (INSULIN SYRINGE .5CC/30GX5/16") 30G X 5/16" 0.5 ML MISC Use as directed to inject insulin.        . Lancets Thin MISC Use as directed to check blood sugar.       . metFORMIN (GLUCOPHAGE) 1000 MG tablet Take 1  tablet (1,000 mg total) by mouth 2 (two) times daily with a meal.  60 tablet  11  . Multiple Vitamins-Minerals (MULTIVITAMIN WITH MINERALS) tablet Take 1 tablet by mouth daily.      . sildenafil (VIAGRA) 50 MG tablet Take 1 tablet 30 minutes before intercourse.       . valsartan (DIOVAN) 80 MG tablet Take 0.5 tablets (40 mg total) by mouth daily. For blood pressure.  90 tablet  3   Current Facility-Administered Medications  Medication Dose Route Frequency Provider Last Rate Last Dose  . pneumococcal 23 valent vaccine (PNU-IMMUNE) injection 0.5 mL  0.5 mL Intramuscular Once Clarene Duke, MD       Family History  Problem Relation Age of Onset  . Diabetes Father   . Heart disease Father   . Colon cancer Neg Hx   . Diabetes Sister   . Heart disease Sister   . Heart disease Mother    History   Social History  . Marital Status: Divorced    Spouse Name: N/A    Number of Children: N/A  . Years of Education: N/A   Social History Main Topics  . Smoking status: Current Everyday Smoker -- 0.5 packs/day    Types: Cigarettes  . Smokeless  tobacco: Never Used   Comment: faxing referral to quitline when signed by patient  . Alcohol Use: Yes     LIQUOR OR WINE WEEKLY  . Drug Use: No  . Sexually Active: None   Other Topics Concern  . None   Social History Narrative   Financial assistance approved for 100% discount at [facility] and has [place]  August 08, 2009 5:48 PM*PATIENT WAS GIVEN DM CARD.Lela Sturdivant NT II  June 08, 2010 3:56 PM   Review of Systems: General: no fevers, chills, changes in weight, changes in appetite Skin: no rash HEENT: no blurry vision, hearing changes, sore throat Pulm: no dyspnea, coughing, wheezing CV: no chest pain, palpitations, shortness of breath Abd: no abdominal pain, nausea/vomiting, diarrhea/constipation GU: no dysuria, hematuria, polyuria Ext: no  myalgias Neuro: no weakness, numbness, or tingling   Objective:  Physical  Exam: Filed Vitals:   01/13/12 1005 01/13/12 1043  BP: 145/89 138/80  Pulse: 73 70  Temp: 97.8 F (36.6 C)   TempSrc: Oral   Height: 5' 11.5" (1.816 m)   Weight: 258 lb 1.6 oz (117.073 kg)    Constitutional: Vital signs reviewed.  Patient is an obese man in no acute distress and cooperative with exam.  Cardiovascular: RRR, S1 normal, S2 normal, no MRG, pulses symmetric and intact bilaterally Pulmonary/Chest: CTAB, no wheezes, rales, or rhonchi Abdominal: Soft. Non-tender, non-distended, bowel sounds are normal, no masses, organomegaly, or guarding present.  Musculoskeletal: Left popliteal region full with palpable cords ; unable to fully extend left knee ; no significant palpable effusion ; pain with palpation of lateral aspect of left knee as well as with McMurray's maneuver  Neurological: A&O x3, Strength is normal and symmetric bilaterally, cranial nerve II-XII are grossly intact, no focal motor deficit, sensory intact to light touch bilaterally.  Skin: Warm, dry and intact. No rash, cyanosis, or clubbing.  Psychiatric: Normal mood and affect. speech and behavior is normal. Judgment and thought content normal. Cognition and memory are normal.   Assessment & Plan:   Case and care discussed with Dr. Eppie Gibson. Patient to have ultrasound done today to rule out DVT versus popliteal cyst versus superficial phlebitis. Please see problem oriented turning for further details.

## 2012-01-22 ENCOUNTER — Other Ambulatory Visit: Payer: Self-pay | Admitting: Internal Medicine

## 2012-01-27 ENCOUNTER — Encounter (HOSPITAL_COMMUNITY): Payer: Self-pay | Admitting: Pharmacy Technician

## 2012-01-30 ENCOUNTER — Encounter (HOSPITAL_COMMUNITY): Payer: Self-pay

## 2012-01-30 ENCOUNTER — Encounter (HOSPITAL_COMMUNITY)
Admission: RE | Admit: 2012-01-30 | Discharge: 2012-01-30 | Disposition: A | Discharge status: Home / Self Care | Payer: Medicaid Other | Source: Ambulatory Visit | Attending: Surgery | Admitting: Surgery

## 2012-01-30 HISTORY — DX: Unspecified osteoarthritis, unspecified site: M19.90

## 2012-01-30 HISTORY — DX: Polyp of colon: K63.5

## 2012-01-30 LAB — CBC
HCT: 37.3 % — ABNORMAL LOW (ref 39.0–52.0)
Hemoglobin: 13.2 g/dL (ref 13.0–17.0)
MCH: 25.1 pg — ABNORMAL LOW (ref 26.0–34.0)
MCHC: 35.4 g/dL (ref 30.0–36.0)
MCV: 71 fL — ABNORMAL LOW (ref 78.0–100.0)
Platelets: 240 10*3/uL (ref 150–400)
RBC: 5.25 MIL/uL (ref 4.22–5.81)
RDW: 16.3 % — ABNORMAL HIGH (ref 11.5–15.5)
WBC: 5.4 10*3/uL (ref 4.0–10.5)

## 2012-01-30 LAB — BASIC METABOLIC PANEL
BUN: 16 mg/dL (ref 6–23)
CO2: 26 mEq/L (ref 19–32)
Calcium: 9 mg/dL (ref 8.4–10.5)
Chloride: 107 mEq/L (ref 96–112)
Creatinine, Ser: 1.2 mg/dL (ref 0.50–1.35)
GFR calc Af Amer: 74 mL/min — ABNORMAL LOW (ref >90)
GFR calc non Af Amer: 64 mL/min — ABNORMAL LOW (ref >90)
Glucose, Bld: 93 mg/dL (ref 70–99)
Potassium: 4.5 mEq/L (ref 3.5–5.1)
Sodium: 138 mEq/L (ref 135–145)

## 2012-01-30 LAB — SURGICAL PCR SCREEN
MRSA, PCR: NEGATIVE
Staphylococcus aureus: NEGATIVE

## 2012-01-30 IMAGING — CR DG CHEST 2V
2 series · 2 of 2 positions shown · non-contrast
Comparison: [DATE]

CLINICAL DATA: Preop left hemicolectomy

CHEST - 2 VIEW

[view not recorded (1 of 2)]
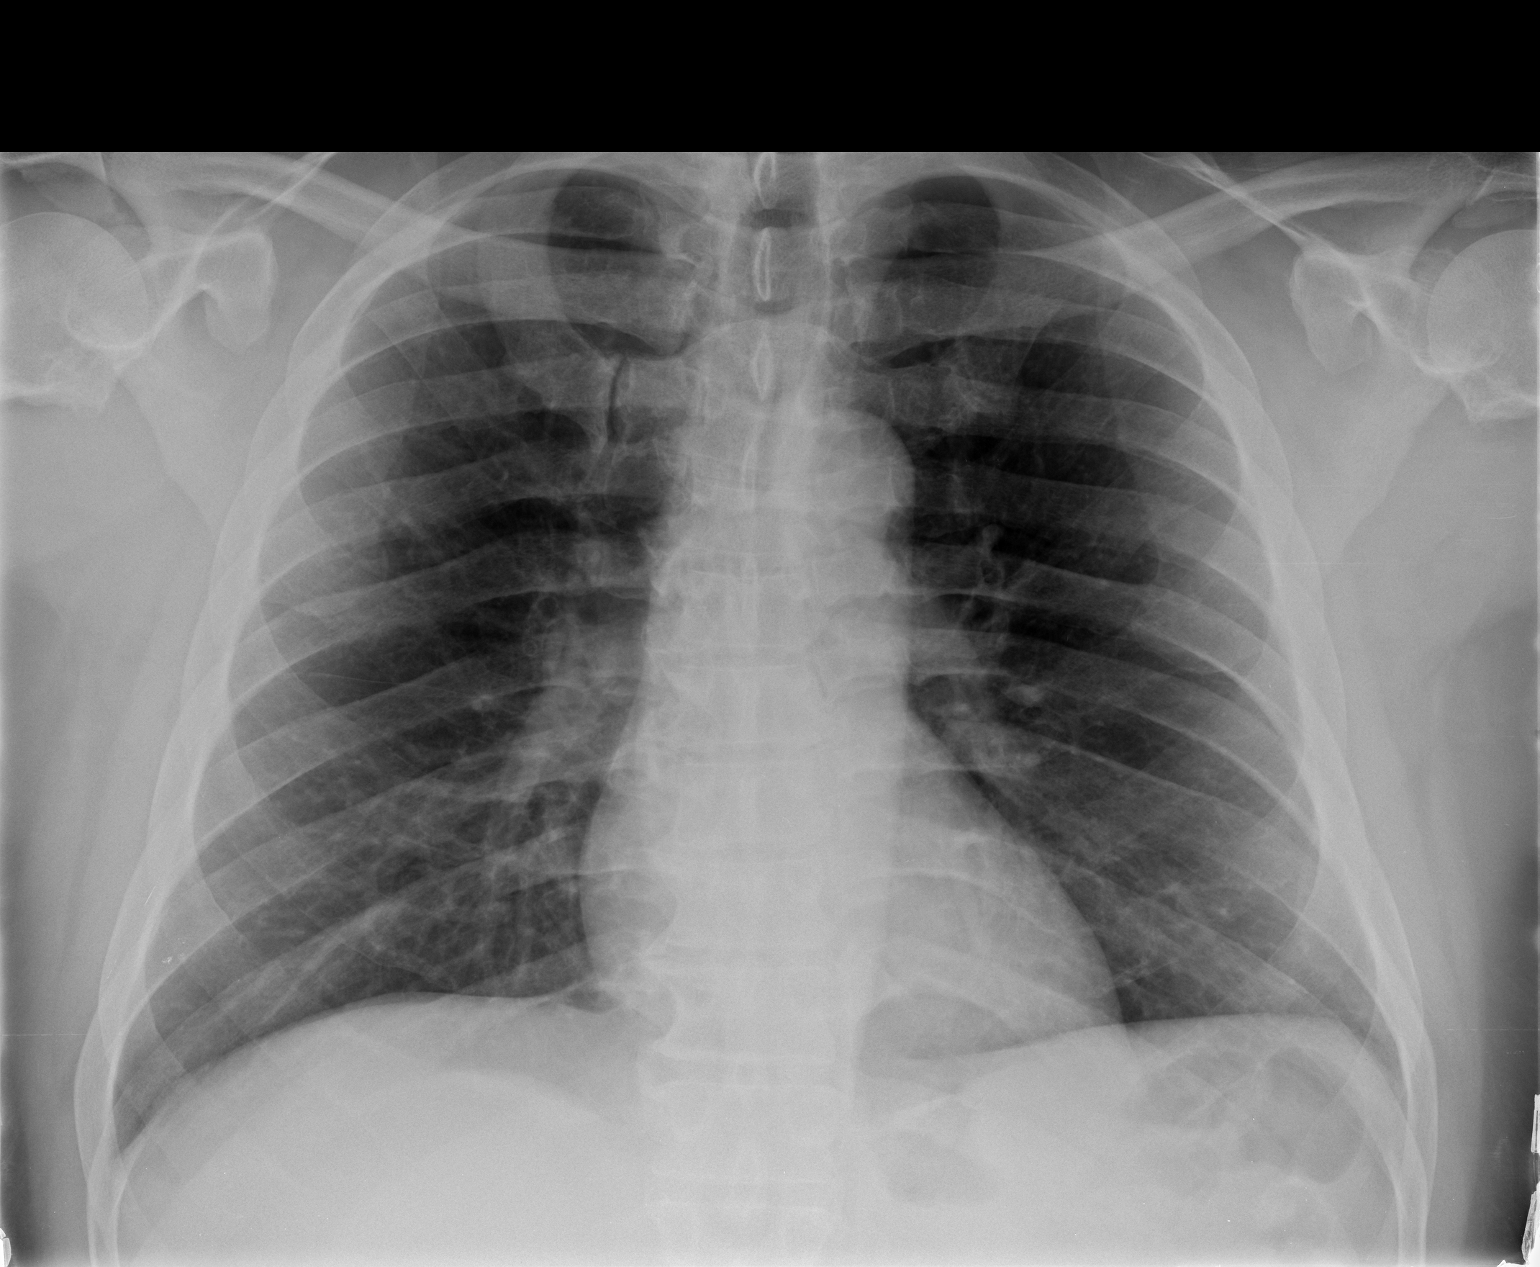

[view not recorded (2 of 2)]
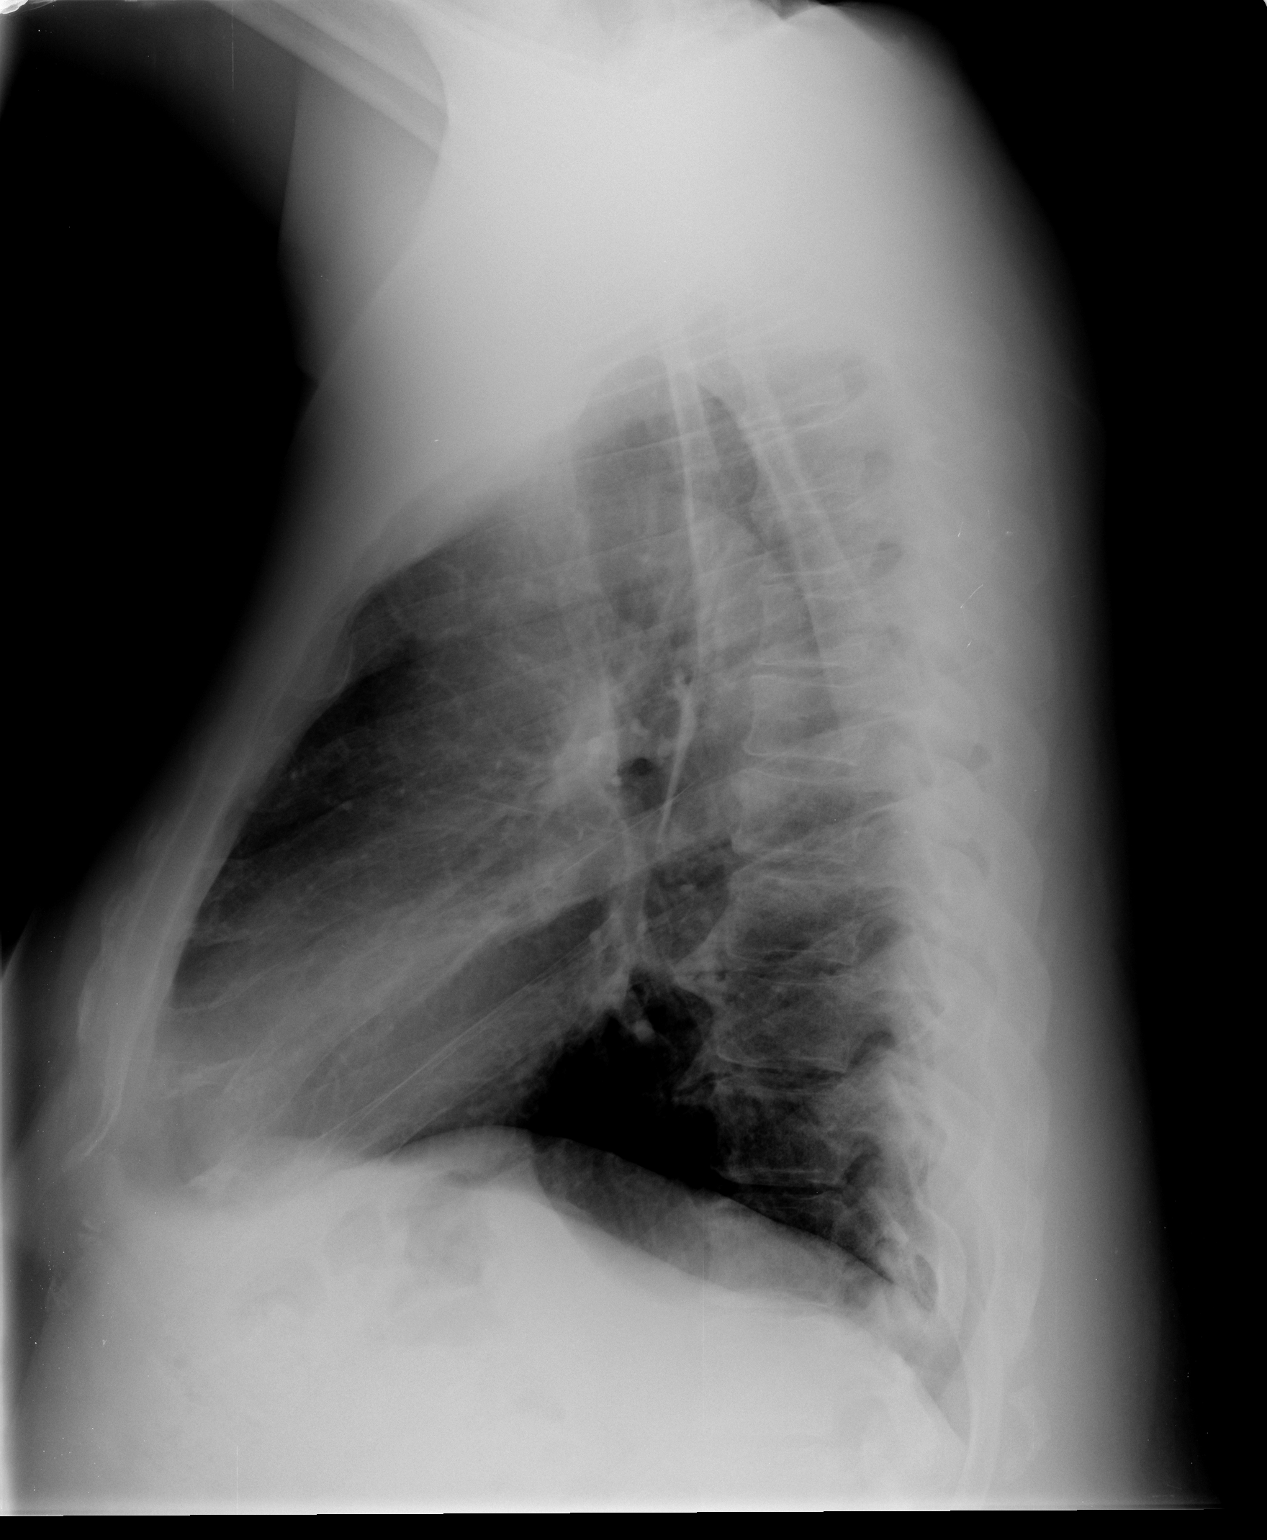

[2 of 2 positions shown; findings below may reference images not displayed]

FINDINGS: Lungs are clear. No pleural effusion or pneumothorax.

Cardiomediastinal silhouette is within normal limits.

Mild degenerative changes of the visualized thoracolumbar spine.
IMPRESSION: No evidence of acute cardiopulmonary disease.

## 2012-01-30 NOTE — Pre-Procedure Instructions (Signed)
[name]  01/30/2012   Your procedure is scheduled on:  Thursday, Sept 5  Report to [facility] at [phone] AM.  Call this number if you have problems the morning of surgery: [phone]   Remember:   Do not eat food or liquids :After Midnight.  May have clear liquids:until Midnight .   Take these medicines the morning of surgery with A SIP OF WATER: none; Take 5 units of insulin At bedtime Wed night with a snack.   Do not wear jewelry, make-up or nail polish.  Do not wear lotions, powders, or perfumes. You may wear deodorant.  Do not shave 48 hours prior to surgery. Men may shave face and neck.  Do not bring valuables to the hospital.  Contacts, dentures or bridgework may not be worn into surgery.  Leave suitcase in the car. After surgery it may be brought to your room.  For patients admitted to the hospital, checkout time is 11:00 AM the day of discharge.   Patients discharged the day of surgery will not be allowed to drive home.  Name and phone number of your driver: n/a  Special Instructions: CHG Shower Use Special Wash: 1/2 bottle night before surgery and 1/2 bottle morning of surgery.   Please read over the following fact sheets that you were given: Pain Booklet, Coughing and Deep Breathing, MRSA Information and Surgical Site Infection Prevention

## 2012-01-30 NOTE — Progress Notes (Signed)
Pt to go to Dr Tsuei's office  to get the instructions for his colon prep; spoke with Deneise Lever at the office and she will have it for him today after his pre admission appt.

## 2012-02-05 MED ORDER — ERTAPENEM SODIUM 1 G IJ SOLR
1.0000 g | INTRAMUSCULAR | Status: AC
  Administered 2012-02-06: 1 g via INTRAVENOUS
  Filled 2012-02-05: qty 1

## 2012-02-05 NOTE — H&P (Signed)
Progress Notes     Patient ID: [name], male   DOB: [birth year], 61 y.o.   MRN: [id number]    Chief Complaint   Patient presents with   .  Colon Polyps        HPI [name] is a 61 y.o. male.  Referred by 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.  He was found to have a 3-4 cm sessile mass in the distal transverse colon. This was biopsied and the biopsy showed high-grade dysplasia with no sign of malignancy. The patient reports no abdominal pain, no changes in bowel movements, no melena or hematochezia. He reports no recent weight loss. He is now referred for surgical evaluation. Past Medical History   Diagnosis  Date   .  Diabetes mellitus     .  Hyperlipidemia     .  Hypertension     .  Heart murmur     .  Sleep apnea         CPAP         Past Surgical History   Procedure  Date   .  Colonoscopy     .  Ganglion cyst excision  20 YRS AGO       RT ARM         Family History   Problem  Relation  Age of Onset   .  Diabetes  Father     .  Heart disease  Father     .  Colon cancer  Neg Hx     .  Diabetes  Sister     .  Heart disease  Sister     .  Heart disease  Mother          Social History History   Substance Use Topics   .  Smoking status:  Current Everyday Smoker -- 0.5 packs/day       Types:  Cigarettes   .  Smokeless tobacco:  Never Used     Comment: faxing referral to quitline when signed by patient   .  Alcohol Use:  Yes         LIQUOR OR WINE WEEKLY    1/2 ppd   No Known Allergies    Current Outpatient Prescriptions   Medication  Sig  Dispense  Refill   .  Ascorbic Acid (VITAMIN C) 100 MG tablet  Take by mouth daily.         Marland Kitchen  aspirin 325 MG tablet  Take 325 mg by mouth daily.         Marland Kitchen  atorvastatin (LIPITOR) 20 MG tablet  Take 1 tablet (20 mg total) by mouth at bedtime.   90 tablet   2   .  insulin NPH-insulin regular (HUMULIN 70/30)  (70-30) 100 UNIT/ML injection  Take 30 units in the morning and 10 units in the evening.   80 mL   6   .  Insulin Syringe-Needle U-100 (INSULIN SYRINGE .5CC/30GX5/16") 30G X 5/16" 0.5 ML MISC  Use as directed to inject insulin.            .  Lancets Thin MISC  Use as directed to check blood sugar.          .  metFORMIN (GLUCOPHAGE) 1000 MG tablet  Take 1 tablet (1,000 mg total) by mouth 2 (two) times daily with a meal.   60  tablet   11   .  Multiple Vitamins-Minerals (MULTIVITAMIN WITH MINERALS) tablet  Take 1 tablet by mouth daily.         .  sildenafil (VIAGRA) 50 MG tablet  Take 1 tablet 30 minutes before intercourse.          .  valsartan (DIOVAN) 80 MG tablet  Take 0.5 tablets (40 mg total) by mouth daily. For blood pressure.   90 tablet   3       Current Facility-Administered Medications   Medication  Dose  Route  Frequency  Provider  Last Rate  Last Dose   .  pneumococcal 23 valent vaccine (PNU-IMMUNE) injection 0.5 mL   0.5 mL  Intramuscular  Once  Clarene Duke, MD              Review of Systems Review of Systems  Constitutional: Negative for fever, chills and unexpected weight change.  HENT: Negative for hearing loss, congestion, sore throat, trouble swallowing and voice change.   Eyes: Negative for visual disturbance.  Respiratory: Negative for cough and wheezing.   Cardiovascular: Negative for chest pain, palpitations and leg swelling.  Gastrointestinal: Negative for nausea, vomiting, abdominal pain, diarrhea, constipation, blood in stool, abdominal distention, anal bleeding and rectal pain.  Genitourinary: Negative for hematuria and difficulty urinating.  Musculoskeletal: Negative for arthralgias.  Skin: Negative for rash and wound.  Neurological: Negative for seizures, syncope, weakness and headaches.  Hematological: Negative for adenopathy. Does not bruise/bleed easily.  Psychiatric/Behavioral: Negative for confusion.      Blood pressure 128/80, pulse 92,  temperature 98.2 F (36.8 C), temperature source Temporal, resp. rate 20, height 5' 11.5" (1.816 m), weight 256 lb 6.4 oz (116.302 kg).   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 One day bowel prep.  The surgical procedure has been discussed with the patient.  Potential risks, benefits, alternative treatments, and expected outcomes have been explained.  All of the patient's questions at this time have been answered.  The likelihood of reaching the patient's treatment goal is good.  The patient understand the proposed surgical procedure and wishes to proceed.        Imogene Burn. Rodney Dover, MD, [facility]  02/05/2012 9:30 PM

## 2012-02-06 ENCOUNTER — Encounter (HOSPITAL_COMMUNITY): Payer: Self-pay | Admitting: General Practice

## 2012-02-06 ENCOUNTER — Encounter (HOSPITAL_COMMUNITY): Payer: Self-pay | Admitting: Anesthesiology

## 2012-02-06 ENCOUNTER — Encounter (HOSPITAL_COMMUNITY)
Admission: RE | Disposition: A | Discharge status: Home / Self Care | Payer: Self-pay | Source: Ambulatory Visit | Attending: Surgery

## 2012-02-06 ENCOUNTER — Inpatient Hospital Stay (HOSPITAL_COMMUNITY)
Admission: RE | Admit: 2012-02-06 | Discharge: 2012-02-13 | DRG: 330 | Disposition: A | Discharge status: Home / Self Care | Payer: Medicaid Other | Source: Ambulatory Visit | Attending: Surgery | Admitting: Surgery

## 2012-02-06 ENCOUNTER — Ambulatory Visit (HOSPITAL_COMMUNITY): Payer: Medicaid Other | Admitting: Anesthesiology

## 2012-02-06 DIAGNOSIS — K635 Polyp of colon: Secondary | ICD-10-CM | POA: Diagnosis present

## 2012-02-06 DIAGNOSIS — E785 Hyperlipidemia, unspecified: Secondary | ICD-10-CM

## 2012-02-06 DIAGNOSIS — I10 Essential (primary) hypertension: Secondary | ICD-10-CM | POA: Diagnosis present

## 2012-02-06 DIAGNOSIS — D539 Nutritional anemia, unspecified: Secondary | ICD-10-CM

## 2012-02-06 DIAGNOSIS — Z833 Family history of diabetes mellitus: Secondary | ICD-10-CM

## 2012-02-06 DIAGNOSIS — N179 Acute kidney failure, unspecified: Secondary | ICD-10-CM | POA: Diagnosis not present

## 2012-02-06 DIAGNOSIS — K929 Disease of digestive system, unspecified: Secondary | ICD-10-CM | POA: Diagnosis not present

## 2012-02-06 DIAGNOSIS — E669 Obesity, unspecified: Secondary | ICD-10-CM

## 2012-02-06 DIAGNOSIS — C184 Malignant neoplasm of transverse colon: Principal | ICD-10-CM | POA: Diagnosis present

## 2012-02-06 DIAGNOSIS — Z9889 Other specified postprocedural states: Secondary | ICD-10-CM

## 2012-02-06 DIAGNOSIS — Z86010 Personal history of colonic polyps: Secondary | ICD-10-CM

## 2012-02-06 DIAGNOSIS — K560 Paralytic ileus: Secondary | ICD-10-CM | POA: Diagnosis not present

## 2012-02-06 DIAGNOSIS — Z8249 Family history of ischemic heart disease and other diseases of the circulatory system: Secondary | ICD-10-CM

## 2012-02-06 DIAGNOSIS — C189 Malignant neoplasm of colon, unspecified: Secondary | ICD-10-CM

## 2012-02-06 DIAGNOSIS — G4733 Obstructive sleep apnea (adult) (pediatric): Secondary | ICD-10-CM | POA: Diagnosis present

## 2012-02-06 DIAGNOSIS — E119 Type 2 diabetes mellitus without complications: Secondary | ICD-10-CM | POA: Diagnosis present

## 2012-02-06 DIAGNOSIS — F17200 Nicotine dependence, unspecified, uncomplicated: Secondary | ICD-10-CM | POA: Diagnosis present

## 2012-02-06 DIAGNOSIS — D62 Acute posthemorrhagic anemia: Secondary | ICD-10-CM | POA: Diagnosis not present

## 2012-02-06 DIAGNOSIS — E875 Hyperkalemia: Secondary | ICD-10-CM | POA: Diagnosis not present

## 2012-02-06 DIAGNOSIS — Z6837 Body mass index (BMI) 37.0-37.9, adult: Secondary | ICD-10-CM

## 2012-02-06 DIAGNOSIS — Y9210 Unspecified residential institution as the place of occurrence of the external cause: Secondary | ICD-10-CM | POA: Diagnosis not present

## 2012-02-06 DIAGNOSIS — Y836 Removal of other organ (partial) (total) as the cause of abnormal reaction of the patient, or of later complication, without mention of misadventure at the time of the procedure: Secondary | ICD-10-CM | POA: Diagnosis not present

## 2012-02-06 HISTORY — DX: Polyp of colon: K63.5

## 2012-02-06 HISTORY — PX: PARTIAL COLECTOMY: SHX5273

## 2012-02-06 HISTORY — PX: COLONOSCOPY: SHX5424

## 2012-02-06 LAB — GLUCOSE, CAPILLARY
Glucose-Capillary: 79 mg/dL (ref 70–99)
Glucose-Capillary: 141 mg/dL — ABNORMAL HIGH (ref 70–99)
Glucose-Capillary: 187 mg/dL — ABNORMAL HIGH (ref 70–99)
Glucose-Capillary: 194 mg/dL — ABNORMAL HIGH (ref 70–99)

## 2012-02-06 SURGERY — LAPAROSCOPIC PARTIAL COLECTOMY
Anesthesia: General | Site: Anus | Wound class: Clean Contaminated

## 2012-02-06 MED ORDER — HYDROMORPHONE 0.3 MG/ML IV SOLN
INTRAVENOUS | Status: AC
  Filled 2012-02-06: qty 25

## 2012-02-06 MED ORDER — ALVIMOPAN 12 MG PO CAPS
12.0000 mg | ORAL_CAPSULE | Freq: Two times a day (BID) | ORAL | Status: DC
  Administered 2012-02-07 – 2012-02-09 (×6): 12 mg via ORAL
  Filled 2012-02-06 (×8): qty 1

## 2012-02-06 MED ORDER — SODIUM CHLORIDE 0.9 % IV SOLN
1.0000 g | INTRAVENOUS | Status: AC
  Administered 2012-02-06: 1 g via INTRAVENOUS
  Filled 2012-02-06: qty 1

## 2012-02-06 MED ORDER — BUPIVACAINE-EPINEPHRINE (PF) 0.25% -1:200000 IJ SOLN
INTRAMUSCULAR | Status: DC | PRN
  Administered 2012-02-06: 10 mL

## 2012-02-06 MED ORDER — HYDROMORPHONE HCL PF 1 MG/ML IJ SOLN
INTRAMUSCULAR | Status: AC
  Filled 2012-02-06: qty 1

## 2012-02-06 MED ORDER — NEOSTIGMINE METHYLSULFATE 1 MG/ML IJ SOLN
INTRAMUSCULAR | Status: DC | PRN
  Administered 2012-02-06: 4 mg via INTRAVENOUS

## 2012-02-06 MED ORDER — LABETALOL HCL 5 MG/ML IV SOLN
INTRAVENOUS | Status: DC | PRN
  Administered 2012-02-06 (×2): 5 mg via INTRAVENOUS

## 2012-02-06 MED ORDER — HYDROMORPHONE 0.3 MG/ML IV SOLN
INTRAVENOUS | Status: DC
  Administered 2012-02-06: 0.9 mg via INTRAVENOUS
  Administered 2012-02-06 (×2): via INTRAVENOUS
  Administered 2012-02-06: 1.6 mg via INTRAVENOUS
  Administered 2012-02-06: 2.67 mg via INTRAVENOUS
  Administered 2012-02-07: 2.4 mg via INTRAVENOUS
  Administered 2012-02-07: 12:00:00 via INTRAVENOUS
  Administered 2012-02-07: 2.1 mg via INTRAVENOUS
  Administered 2012-02-07: 3 mg via INTRAVENOUS
  Administered 2012-02-07: 1.5 mg via INTRAVENOUS
  Administered 2012-02-08: 0.3 mg via INTRAVENOUS
  Administered 2012-02-08: 16:00:00 via INTRAVENOUS
  Administered 2012-02-08: 0.6 mg via INTRAVENOUS
  Administered 2012-02-08: 1.2 mg via INTRAVENOUS
  Administered 2012-02-08: 0.6 mg via INTRAVENOUS
  Administered 2012-02-09: 2.4 mg via INTRAVENOUS
  Administered 2012-02-09: 1.5 mg via INTRAVENOUS
  Administered 2012-02-09: 0.6 mg via INTRAVENOUS
  Administered 2012-02-09: 1.3 mg via INTRAVENOUS
  Filled 2012-02-06 (×3): qty 25

## 2012-02-06 MED ORDER — INSULIN GLARGINE 100 UNIT/ML ~~LOC~~ SOLN
5.0000 [IU] | Freq: Every day | SUBCUTANEOUS | Status: DC
  Administered 2012-02-06 – 2012-02-12 (×7): 5 [IU] via SUBCUTANEOUS

## 2012-02-06 MED ORDER — OXYCODONE-ACETAMINOPHEN 5-325 MG PO TABS
1.0000 | ORAL_TABLET | Freq: Four times a day (QID) | ORAL | Status: DC | PRN
  Administered 2012-02-06 – 2012-02-07 (×3): 2 via ORAL
  Administered 2012-02-09: 1 via ORAL
  Administered 2012-02-10 – 2012-02-12 (×7): 2 via ORAL
  Filled 2012-02-06 (×2): qty 2
  Filled 2012-02-06: qty 1
  Filled 2012-02-06 (×8): qty 2

## 2012-02-06 MED ORDER — OXYCODONE HCL 5 MG/5ML PO SOLN: 5.0000 mg | Freq: Once | ORAL | Status: DC | PRN

## 2012-02-06 MED ORDER — HYDROMORPHONE HCL PF 1 MG/ML IJ SOLN
0.2500 mg | INTRAMUSCULAR | Status: DC | PRN
  Administered 2012-02-06 (×4): 0.5 mg via INTRAVENOUS

## 2012-02-06 MED ORDER — ALBUMIN HUMAN 5 % IV SOLN
INTRAVENOUS | Status: DC | PRN
  Administered 2012-02-06: 10:00:00 via INTRAVENOUS

## 2012-02-06 MED ORDER — ONDANSETRON HCL 4 MG/2ML IJ SOLN
4.0000 mg | Freq: Four times a day (QID) | INTRAMUSCULAR | Status: DC | PRN
  Administered 2012-02-07: 4 mg via INTRAVENOUS
  Filled 2012-02-06: qty 2

## 2012-02-06 MED ORDER — ONDANSETRON HCL 4 MG/2ML IJ SOLN
4.0000 mg | Freq: Four times a day (QID) | INTRAMUSCULAR | Status: DC | PRN
  Administered 2012-02-11 – 2012-02-13 (×3): 4 mg via INTRAVENOUS
  Filled 2012-02-06 (×4): qty 2

## 2012-02-06 MED ORDER — DROPERIDOL 2.5 MG/ML IJ SOLN: 0.6250 mg | INTRAMUSCULAR | Status: DC | PRN

## 2012-02-06 MED ORDER — NALOXONE HCL 0.4 MG/ML IJ SOLN: 0.4000 mg | INTRAMUSCULAR | Status: DC | PRN

## 2012-02-06 MED ORDER — ENOXAPARIN SODIUM 40 MG/0.4ML ~~LOC~~ SOLN
40.0000 mg | SUBCUTANEOUS | Status: DC
  Administered 2012-02-07 – 2012-02-13 (×7): 40 mg via SUBCUTANEOUS
  Filled 2012-02-06 (×8): qty 0.4

## 2012-02-06 MED ORDER — 0.9 % SODIUM CHLORIDE (POUR BTL) OPTIME
TOPICAL | Status: DC | PRN
  Administered 2012-02-06: 2000 mL
  Administered 2012-02-06: 3000 mL

## 2012-02-06 MED ORDER — HYDROMORPHONE HCL PF 1 MG/ML IJ SOLN
INTRAMUSCULAR | Status: AC
  Administered 2012-02-06: 0.5 mg via INTRAVENOUS
  Filled 2012-02-06: qty 1

## 2012-02-06 MED ORDER — WHITE PETROLATUM GEL
Status: AC
  Administered 2012-02-06: 16:00:00
  Filled 2012-02-06: qty 5

## 2012-02-06 MED ORDER — POTASSIUM CHLORIDE IN NACL 20-0.9 MEQ/L-% IV SOLN
INTRAVENOUS | Status: DC
  Administered 2012-02-06 – 2012-02-07 (×2): via INTRAVENOUS
  Filled 2012-02-06 (×4): qty 1000

## 2012-02-06 MED ORDER — OXYCODONE HCL 5 MG PO TABS: 5.0000 mg | ORAL_TABLET | Freq: Once | ORAL | Status: DC | PRN

## 2012-02-06 MED ORDER — INSULIN ASPART 100 UNIT/ML ~~LOC~~ SOLN
0.0000 [IU] | Freq: Three times a day (TID) | SUBCUTANEOUS | Status: DC
  Administered 2012-02-06 – 2012-02-07 (×2): 3 [IU] via SUBCUTANEOUS
  Administered 2012-02-07: 2 [IU] via SUBCUTANEOUS
  Administered 2012-02-07 – 2012-02-08 (×2): 3 [IU] via SUBCUTANEOUS
  Administered 2012-02-08 – 2012-02-12 (×10): 2 [IU] via SUBCUTANEOUS
  Administered 2012-02-13: 3 [IU] via SUBCUTANEOUS

## 2012-02-06 MED ORDER — PROPOFOL 10 MG/ML IV EMUL
INTRAVENOUS | Status: DC | PRN
  Administered 2012-02-06: 200 mg via INTRAVENOUS

## 2012-02-06 MED ORDER — LACTATED RINGERS IV SOLN
INTRAVENOUS | Status: DC | PRN
  Administered 2012-02-06 (×4): via INTRAVENOUS

## 2012-02-06 MED ORDER — DIPHENHYDRAMINE HCL 50 MG/ML IJ SOLN: 12.5000 mg | Freq: Four times a day (QID) | INTRAMUSCULAR | Status: DC | PRN

## 2012-02-06 MED ORDER — VECURONIUM BROMIDE 10 MG IV SOLR
INTRAVENOUS | Status: DC | PRN
  Administered 2012-02-06 (×2): 1 mg via INTRAVENOUS
  Administered 2012-02-06 (×2): 2 mg via INTRAVENOUS

## 2012-02-06 MED ORDER — INSULIN ASPART 100 UNIT/ML ~~LOC~~ SOLN: 0.0000 [IU] | Freq: Every day | SUBCUTANEOUS | Status: DC

## 2012-02-06 MED ORDER — ROCURONIUM BROMIDE 100 MG/10ML IV SOLN
INTRAVENOUS | Status: DC | PRN
  Administered 2012-02-06: 50 mg via INTRAVENOUS

## 2012-02-06 MED ORDER — ONDANSETRON HCL 4 MG PO TABS: 4.0000 mg | ORAL_TABLET | Freq: Four times a day (QID) | ORAL | Status: DC | PRN

## 2012-02-06 MED ORDER — IRBESARTAN 75 MG PO TABS
75.0000 mg | ORAL_TABLET | Freq: Every day | ORAL | Status: DC
  Administered 2012-02-07: 75 mg via ORAL
  Filled 2012-02-06 (×4): qty 1

## 2012-02-06 MED ORDER — BUPIVACAINE-EPINEPHRINE PF 0.25-1:200000 % IJ SOLN
INTRAMUSCULAR | Status: AC
  Filled 2012-02-06: qty 30

## 2012-02-06 MED ORDER — DIPHENHYDRAMINE HCL 12.5 MG/5ML PO ELIX
12.5000 mg | ORAL_SOLUTION | Freq: Four times a day (QID) | ORAL | Status: DC | PRN
  Filled 2012-02-06: qty 5

## 2012-02-06 MED ORDER — ENOXAPARIN SODIUM 40 MG/0.4ML ~~LOC~~ SOLN
40.0000 mg | SUBCUTANEOUS | Status: DC
  Filled 2012-02-06: qty 0.4

## 2012-02-06 MED ORDER — INSULIN ASPART 100 UNIT/ML ~~LOC~~ SOLN
4.0000 [IU] | Freq: Three times a day (TID) | SUBCUTANEOUS | Status: DC
  Administered 2012-02-06 – 2012-02-13 (×6): 4 [IU] via SUBCUTANEOUS

## 2012-02-06 MED ORDER — ATORVASTATIN CALCIUM 20 MG PO TABS
20.0000 mg | ORAL_TABLET | Freq: Every day | ORAL | Status: DC
  Administered 2012-02-06 – 2012-02-12 (×7): 20 mg via ORAL
  Filled 2012-02-06 (×8): qty 1

## 2012-02-06 MED ORDER — EPHEDRINE SULFATE 50 MG/ML IJ SOLN
INTRAMUSCULAR | Status: DC | PRN
  Administered 2012-02-06: 5 mg via INTRAVENOUS
  Administered 2012-02-06: 10 mg via INTRAVENOUS

## 2012-02-06 MED ORDER — MIDAZOLAM HCL 5 MG/5ML IJ SOLN
INTRAMUSCULAR | Status: DC | PRN
  Administered 2012-02-06: 2 mg via INTRAVENOUS

## 2012-02-06 MED ORDER — LIDOCAINE HCL (CARDIAC) 20 MG/ML IV SOLN
INTRAVENOUS | Status: DC | PRN
  Administered 2012-02-06: 80 mg via INTRAVENOUS

## 2012-02-06 MED ORDER — FENTANYL CITRATE 0.05 MG/ML IJ SOLN
INTRAMUSCULAR | Status: DC | PRN
  Administered 2012-02-06 (×2): 100 ug via INTRAVENOUS
  Administered 2012-02-06: 50 ug via INTRAVENOUS
  Administered 2012-02-06: 100 ug via INTRAVENOUS
  Administered 2012-02-06 (×3): 50 ug via INTRAVENOUS

## 2012-02-06 MED ORDER — GLYCOPYRROLATE 0.2 MG/ML IJ SOLN
INTRAMUSCULAR | Status: DC | PRN
  Administered 2012-02-06: 0.2 mg via INTRAVENOUS
  Administered 2012-02-06: 0.6 mg via INTRAVENOUS

## 2012-02-06 MED ORDER — ONDANSETRON HCL 4 MG/2ML IJ SOLN
INTRAMUSCULAR | Status: DC | PRN
  Administered 2012-02-06: 4 mg via INTRAVENOUS

## 2012-02-06 MED ORDER — SODIUM CHLORIDE 0.9 % IJ SOLN: 9.0000 mL | INTRAMUSCULAR | Status: DC | PRN

## 2012-02-06 SURGICAL SUPPLY — 80 items
APPLIER CLIP 5 13 M/L LIGAMAX5 (MISCELLANEOUS)
APPLIER CLIP ROT 10 11.4 M/L (STAPLE)
APR CLP MED LRG 11.4X10 (STAPLE)
APR CLP MED LRG 5 ANG JAW (MISCELLANEOUS)
BLADE SURG 10 STRL SS (BLADE) ×4 IMPLANT
BLADE SURG ROTATE 9660 (MISCELLANEOUS) ×3 IMPLANT
CANISTER SUCTION 2500CC (MISCELLANEOUS) ×4 IMPLANT
CELLS DAT CNTRL 66122 CELL SVR (MISCELLANEOUS) IMPLANT
CHLORAPREP W/TINT 26ML (MISCELLANEOUS) ×4 IMPLANT
CLIP APPLIE 5 13 M/L LIGAMAX5 (MISCELLANEOUS) IMPLANT
CLIP APPLIE ROT 10 11.4 M/L (STAPLE) IMPLANT
CLOTH BEACON ORANGE TIMEOUT ST (SAFETY) ×4 IMPLANT
COVER SURGICAL LIGHT HANDLE (MISCELLANEOUS) ×4 IMPLANT
DECANTER SPIKE VIAL GLASS SM (MISCELLANEOUS) ×4 IMPLANT
DRAPE PROXIMA HALF (DRAPES) ×2 IMPLANT
DRAPE UTILITY 15X26 W/TAPE STR (DRAPE) ×8 IMPLANT
DRSG COVADERM 4X14 (GAUZE/BANDAGES/DRESSINGS) ×2 IMPLANT
ELECT CAUTERY BLADE 6.4 (BLADE) ×7 IMPLANT
ELECT REM PT RETURN 9FT ADLT (ELECTROSURGICAL) ×4
ELECTRODE REM PT RTRN 9FT ADLT (ELECTROSURGICAL) ×3 IMPLANT
GEL ULTRASOUND 20GR AQUASONIC (MISCELLANEOUS) IMPLANT
GLOVE BIO SURGEON STRL SZ7 (GLOVE) ×8 IMPLANT
GLOVE BIO SURGEON STRL SZ7.5 (GLOVE) ×3 IMPLANT
GLOVE BIO SURGEON STRL SZ8 (GLOVE) ×4 IMPLANT
GLOVE BIOGEL PI IND STRL 6.5 (GLOVE) IMPLANT
GLOVE BIOGEL PI IND STRL 7.0 (GLOVE) IMPLANT
GLOVE BIOGEL PI IND STRL 7.5 (GLOVE) ×5 IMPLANT
GLOVE BIOGEL PI INDICATOR 6.5 (GLOVE) ×1
GLOVE BIOGEL PI INDICATOR 7.0 (GLOVE) ×3
GLOVE BIOGEL PI INDICATOR 7.5 (GLOVE) ×2
GLOVE SS BIOGEL STRL SZ 6.5 (GLOVE) ×2 IMPLANT
GLOVE SUPERSENSE BIOGEL SZ 6.5 (GLOVE) ×2
GLOVE SURG SIGNA 7.5 PF LTX (GLOVE) ×6 IMPLANT
GOWN PREVENTION PLUS XLARGE (GOWN DISPOSABLE) ×4 IMPLANT
GOWN PREVENTION PLUS XXLARGE (GOWN DISPOSABLE) ×3 IMPLANT
GOWN STRL NON-REIN LRG LVL3 (GOWN DISPOSABLE) ×16 IMPLANT
KIT BASIN OR (CUSTOM PROCEDURE TRAY) ×4 IMPLANT
KIT ROOM TURNOVER OR (KITS) ×4 IMPLANT
LEGGING LITHOTOMY PAIR STRL (DRAPES) ×2 IMPLANT
LIGASURE 5MM LAPAROSCOPIC (INSTRUMENTS) IMPLANT
LIGASURE IMPACT 36 18CM CVD LR (INSTRUMENTS) IMPLANT
NS IRRIG 1000ML POUR BTL (IV SOLUTION) ×14 IMPLANT
PAD ARMBOARD 7.5X6 YLW CONV (MISCELLANEOUS) ×8 IMPLANT
PENCIL BUTTON HOLSTER BLD 10FT (ELECTRODE) ×4 IMPLANT
RELOAD PROXIMATE 75MM BLUE (ENDOMECHANICALS) ×8 IMPLANT
RELOAD STAPLE 75 3.8 BLU REG (ENDOMECHANICALS) IMPLANT
RETRACTOR WND ALEXIS 18 MED (MISCELLANEOUS) IMPLANT
RTRCTR WOUND ALEXIS 18CM MED (MISCELLANEOUS)
SCALPEL HARMONIC ACE (MISCELLANEOUS) IMPLANT
SCISSORS LAP 5X35 DISP (ENDOMECHANICALS) IMPLANT
SET IRRIG TUBING LAPAROSCOPIC (IRRIGATION / IRRIGATOR) ×1 IMPLANT
SLEEVE ENDOPATH XCEL 5M (ENDOMECHANICALS) ×4 IMPLANT
SPECIMEN JAR LARGE (MISCELLANEOUS) ×4 IMPLANT
SPONGE GAUZE 4X4 12PLY (GAUZE/BANDAGES/DRESSINGS) ×2 IMPLANT
SPONGE LAP 18X18 X RAY DECT (DISPOSABLE) ×9 IMPLANT
STAPLER GUN LINEAR PROX 60 (STAPLE) ×2 IMPLANT
STAPLER PROXIMATE 75MM BLUE (STAPLE) ×3 IMPLANT
STAPLER VISISTAT 35W (STAPLE) ×4 IMPLANT
SUCTION POOLE TIP (SUCTIONS) ×3 IMPLANT
SURGILUBE 2OZ TUBE FLIPTOP (MISCELLANEOUS) IMPLANT
SUT PROLENE 2 0 CT2 30 (SUTURE) IMPLANT
SUT PROLENE 2 0 KS (SUTURE) IMPLANT
SUT SILK 2 0 SH CR/8 (SUTURE) ×4 IMPLANT
SUT SILK 2 0 TIES 10X30 (SUTURE) ×4 IMPLANT
SUT SILK 3 0 SH CR/8 (SUTURE) ×4 IMPLANT
SUT SILK 3 0 TIES 10X30 (SUTURE) ×4 IMPLANT
SYR BULB IRRIGATION 50ML (SYRINGE) ×4 IMPLANT
SYS LAPSCP GELPORT 120MM (MISCELLANEOUS)
SYSTEM LAPSCP GELPORT 120MM (MISCELLANEOUS) IMPLANT
TOWEL OR 17X24 6PK STRL BLUE (TOWEL DISPOSABLE) ×5 IMPLANT
TOWEL OR 17X26 10 PK STRL BLUE (TOWEL DISPOSABLE) ×4 IMPLANT
TRAY FOLEY CATH 14FRSI W/METER (CATHETERS) ×4 IMPLANT
TRAY LAPAROSCOPIC (CUSTOM PROCEDURE TRAY) ×4 IMPLANT
TRAY PROCTOSCOPIC FIBER OPTIC (SET/KITS/TRAYS/PACK) IMPLANT
TROCAR XCEL BLUNT TIP 100MML (ENDOMECHANICALS) ×1 IMPLANT
TROCAR XCEL NON-BLD 11X100MML (ENDOMECHANICALS) IMPLANT
TROCAR XCEL NON-BLD 5MMX100MML (ENDOMECHANICALS) ×4 IMPLANT
TUBE CONNECTING 12X1/4 (SUCTIONS) ×4 IMPLANT
WATER STERILE IRR 1000ML POUR (IV SOLUTION) IMPLANT
YANKAUER SUCT BULB TIP NO VENT (SUCTIONS) ×5 IMPLANT

## 2012-02-06 NOTE — Preoperative (Signed)
Beta Blockers   Reason not to administer Beta Blockers:Not Applicable. No home beta blockers 

## 2012-02-06 NOTE — Op Note (Signed)
Preop diagnosis: High-grade dysplasia in a polyp transverse colon Postop diagnosis: Same Procedure performed: Laparoscopic-assisted mobilization of the splenic flexure, Right hemicolectomy with primary anastomosis  Intraoperative consult to Dr. Oretha Caprice for colonoscopy to localize polyp  Surgeon:  Brianni Manthe K. Anesthesia:  GETT Indications: This is a 61 year old male who recently underwent colonoscopy by [name].  He was found to have a 3-4 cm sessile mass in the distal transverse colon which was marked with ink. Biopsy showed high-grade dysplasia with no sign of malignancy. He presents now for surgical resection.  Description of procedure: The patient was brought to the operating room and placed in a supine position on the operating room table. After an adequate level of general anesthesia was obtained a Foley catheter was placed in sterile technique. The patient's legs were placed in lithotomy position in yellowfin stirrups. His abdomen was shaved prepped with chlor prep and draped sterile fashion. His perineum had been prepped with Betadine.  We made a vertical incision just below the umbilicus. Dissection was carried down the fascia. The fascia is grasped with clamps and we entered the peritoneal cavity bluntly. A stay suture of 0 Vicryl was placed on the fascial opening. The Hassan cannula was inserted and secured stay suture. Pneumoperitoneum was obtained insufflating CO2 maintaining a maximum pressure of 15 mm of mercury. Initially there was some bradycardia but this resolved. A total of 4 additional 5 mm ports were placed, 2 on each side. We wrapped identified the descending colon. We divided the lateral attachments of the descending colon heading up towards the splenic flexure. The splenic flexure is very high and his densely adherent to the spleen. We used the harmonic scalpel to dissect the omentum away from the transverse colon. We could not visualize any tattooed ink  laparoscopically. We continued carefully dissecting the splenic flexure down. However it became apparent that this was a fairly high splenic flexure which was densely adherent to the spleen. We decided to use a hand port to see if this would help with the dissection. We extended our midline incision and inserted a GelPort device. Were able to take down the splenic flexure using combination harmonic scalpel and blunt dissection. However we could still not palpate the polyp or see any the [place] ink. The colonoscopy report had mentioned that this was the distal transverse colon. We had mobilized the mid transverse colon all way down to the descending colon and cannot identify the polyp. We then converted to an open procedure by extending our midline incision from the xiphoid to just below the umbilicus. We fully mobilized the transverse colon and the descending colon up in the field. We carefully palpated this area could not identify the mass. We could not identify any ink.A colonoscope was brought over from the endoscopy unit. We called Dr. Oretha Caprice who is the partner for [name]. He was able to come to the OR and was able to perform a colonoscopy on the table. In the proximal transverse colon at the hepatic flexure he was able to identify the mass is a very tiny spot of tattoo week. We could not identify the the ink on the outside of the colon. We were able to mark this area with a suture. We inspected the remainder of the colon and there was a small serosal tears that was repaired with 2-0 silk sutures. We then performed a right hemicolectomy by dividing the terminal ileum with a GIA-75 stapler. The  Transverse colon was divided in its midportion  with a GIA-75. The mesentery was taken with the LigaSure device. We created a side-to-side anastomosis with another firing of the GIA-75 stapler. The staple line was intact. We closed the enterotomy with a TA 60 stapler. The mesenteric defect was closed with 2-0 silk  sutures. A crotch suture of 3-0 silk was placed at the crotch of the anastomosis. We thoroughly irrigated the abdomen inspected for hemostasis. There was no bleeding coming from the spleen from our dissection.Again we inspected for hemostasis. The fascia was then reapproximated with double-stranded #1 PDS suture. The subcutaneous tissues were irrigated and staples were used to close the skin. Staples were also used to the port sites. Dry dressing was applied. The patient was then extubated but recovery in stable condition. All sponge, instrument, and needle counts are correct.  Imogene Burn. [name], MD, [facility]  02/06/2012 2:28 PM

## 2012-02-06 NOTE — Anesthesia Preprocedure Evaluation (Addendum)
Anesthesia Evaluation  Patient identified by MRN, date of birth, ID band Patient awake    Reviewed: Allergy & Precautions, H&P , NPO status , Patient's Chart, lab work & pertinent test results, reviewed documented beta blocker date and time   Airway Mallampati: I TM Distance: >3 FB Neck ROM: Full    Dental  (+) Teeth Intact, Edentulous Upper and Dental Advisory Given   Pulmonary sleep apnea and Continuous Positive Airway Pressure Ventilation , Current Smoker,  breath sounds clear to auscultation  Pulmonary exam normal       Cardiovascular hypertension, Pt. on medications + Valvular Problems/Murmurs Rhythm:Regular Rate:Normal     Neuro/Psych negative neurological ROS  negative psych ROS   GI/Hepatic negative GI ROS, Neg liver ROS,   Endo/Other  diabetes, Type 1, Insulin Dependent  Renal/GU negative Renal ROS     Musculoskeletal   Abdominal   Peds  Hematology   Anesthesia Other Findings   Reproductive/Obstetrics                          Anesthesia Physical Anesthesia Plan  ASA: III  Anesthesia Plan: General   Post-op Pain Management:    Induction: Intravenous  Airway Management Planned: Oral ETT  Additional Equipment:   Intra-op Plan:   Post-operative Plan: Extubation in OR  Informed Consent: I have reviewed the patients History and Physical, chart, labs and discussed the procedure including the risks, benefits and alternatives for the proposed anesthesia with the patient or authorized representative who has indicated his/her understanding and acceptance.   Dental advisory given  Plan Discussed with: CRNA, Anesthesiologist and Surgeon  Anesthesia Plan Comments:         Anesthesia Quick Evaluation

## 2012-02-06 NOTE — Anesthesia Procedure Notes (Signed)
Procedure Name: Intubation Date/Time: 02/06/2012 7:34 AM Performed by: Carter Kitten Pre-anesthesia Checklist: Patient identified, Timeout performed, Emergency Drugs available, Suction available and Patient being monitored Patient Re-evaluated:Patient Re-evaluated prior to inductionOxygen Delivery Method: Circle system utilized Preoxygenation: Pre-oxygenation with 100% oxygen Intubation Type: IV induction Ventilation: Mask ventilation without difficulty Laryngoscope Size: Mac and 3 Grade View: Grade II Tube type: Oral Tube size: 8.0 mm Number of attempts: 1 Airway Equipment and Method: Stylet Placement Confirmation: positive ETCO2,  ETT inserted through vocal cords under direct vision and breath sounds checked- equal and bilateral Secured at: 23 cm Tube secured with: Tape Dental Injury: Teeth and Oropharynx as per pre-operative assessment

## 2012-02-06 NOTE — Progress Notes (Signed)
Patient complaining of minimal pain relief with PCA, requested that his doctor be notified. Called MD on call, Grandville Silos, received orders for 1-2 Percocet q6h PRN, pain.

## 2012-02-06 NOTE — Progress Notes (Signed)
Call to Dr. Tobias Alexander, reported CBG- 79, pt. Denies S&S of hypoglycemia , took 1/2 p.m.  insulin dose last p.m.

## 2012-02-06 NOTE — Interval H&P Note (Signed)
History and Physical Interval Note:  02/06/2012 7:18 AM  [name]  has presented today for surgery, with the diagnosis of High-grade dysplastic polyp - transverse colon  The various methods of treatment have been discussed with the patient and family. After consideration of risks, benefits and other options for treatment, the patient has consented to  Procedure(s) (LRB) with comments: LAPAROSCOPIC PARTIAL COLECTOMY (Left) - laparoscopic assisted left hemi-colectomy as a surgical intervention .  The patient's history has been reviewed, patient examined, no change in status, stable for surgery.  I have reviewed the patient's chart and labs.  Questions were answered to the patient's satisfaction.     Damien Cisar K.

## 2012-02-06 NOTE — Progress Notes (Signed)
ARRIVED FROM PACU TO ROOM 17. A/OX4, MOVED SELF FROM STRETCHER TO BED WITHOUT DIFFICULTY, DENIES NAUSEA/PAIN, ORIENTED TO ROOM AND SURROUNDINGS

## 2012-02-06 NOTE — Brief Op Note (Signed)
02/06/2012  11:30 AM  PATIENT:  [name]  61 y.o. male  PRE-OPERATIVE DIAGNOSIS:  High-grade dysplastic polyp - transverse colon  POST-OPERATIVE DIAGNOSIS:  High-grade dysplastic polyp - transverse colon  PROCEDURE:  Procedure(s) (LRB) with comments: LAPAROSCOPIC PARTIAL COLECTOMY (Left) - attempted laparoscopic assisted left hemi-colectomy PARTIAL COLECTOMY (N/A) - right partial colectomy COLONOSCOPY ()  SURGEON:  Surgeon(s) and Role: Panel 1:    Imogene Burn. [name], MD - Primary  Panel 2:    * Milus Banister, MD - Primary  PHYSICIAN ASSISTANT:   ASSISTANTS: Coralie Keens, MD   ANESTHESIA:   general  EBL:  Total I/O In: [id number] [I.V.:4000; IV Piggyback:250] Out: 145 [Urine:145]  BLOOD ADMINISTERED:none  DRAINS: Urinary Catheter (Foley)   LOCAL MEDICATIONS USED:  NONE  SPECIMEN:  Source of Specimen:  right colon  DISPOSITION OF SPECIMEN:  PATHOLOGY  COUNTS:  YES  TOURNIQUET:  * No tourniquets in log *  DICTATION: .Dragon Dictation  PLAN OF CARE: Admit to inpatient   PATIENT DISPOSITION:  PACU    Delay start of Pharmacological VTE agent (>24hrs) due to surgical blood loss or risk of bleeding: no  Imogene Burn. [name], MD, [facility] Surgery  02/06/2012 11:31 AM

## 2012-02-06 NOTE — Anesthesia Postprocedure Evaluation (Signed)
Anesthesia Post Note  Patient: [name]  Procedure(s) Performed: Procedure(s) (LRB): LAPAROSCOPIC PARTIAL COLECTOMY (Left) PARTIAL COLECTOMY (N/A) COLONOSCOPY ()  Anesthesia type: general  Patient location: PACU  Post pain: Pain level controlled  Post assessment: Patient's Cardiovascular Status Stable  Last Vitals:  Filed Vitals:   02/06/12 1235  BP: 128/79  Pulse: 74  Temp:   Resp: 23    Post vital signs: Reviewed and stable  Level of consciousness: sedated  Complications: No apparent anesthesia complications

## 2012-02-06 NOTE — Transfer of Care (Signed)
Immediate Anesthesia Transfer of Care Note  Patient: [name]  Procedure(s) Performed: Procedure(s) (LRB) with comments: LAPAROSCOPIC PARTIAL COLECTOMY (Left) - attempted laparoscopic assisted left hemi-colectomy PARTIAL COLECTOMY (N/A) - right partial colectomy COLONOSCOPY ()  Patient Location: PACU  Anesthesia Type: General  Level of Consciousness: awake and confused  Airway & Oxygen Therapy: Patient Spontanous Breathing and Patient connected to nasal cannula oxygen  Post-op Assessment: Report given to PACU RN and Post -op Vital signs reviewed and stable  Post vital signs: Reviewed and stable  Complications: No apparent anesthesia complications

## 2012-02-07 ENCOUNTER — Encounter (HOSPITAL_COMMUNITY): Payer: Self-pay | Admitting: Surgery

## 2012-02-07 DIAGNOSIS — E875 Hyperkalemia: Secondary | ICD-10-CM

## 2012-02-07 DIAGNOSIS — N179 Acute kidney failure, unspecified: Secondary | ICD-10-CM

## 2012-02-07 LAB — BASIC METABOLIC PANEL
BUN: 29 mg/dL — ABNORMAL HIGH (ref 6–23)
BUN: 39 mg/dL — ABNORMAL HIGH (ref 6–23)
CO2: 24 mEq/L (ref 19–32)
CO2: 24 mEq/L (ref 19–32)
Calcium: 7.9 mg/dL — ABNORMAL LOW (ref 8.4–10.5)
Calcium: 8 mg/dL — ABNORMAL LOW (ref 8.4–10.5)
Chloride: 105 mEq/L (ref 96–112)
Chloride: 105 mEq/L (ref 96–112)
Creatinine, Ser: 2.34 mg/dL — ABNORMAL HIGH (ref 0.50–1.35)
Creatinine, Ser: 2.88 mg/dL — ABNORMAL HIGH (ref 0.50–1.35)
GFR calc Af Amer: 33 mL/min — ABNORMAL LOW (ref >90)
GFR calc Af Amer: 26 mL/min — ABNORMAL LOW (ref >90)
GFR calc non Af Amer: 29 mL/min — ABNORMAL LOW (ref >90)
GFR calc non Af Amer: 22 mL/min — ABNORMAL LOW (ref >90)
Glucose, Bld: 175 mg/dL — ABNORMAL HIGH (ref 70–99)
Glucose, Bld: 151 mg/dL — ABNORMAL HIGH (ref 70–99)
Potassium: 5.4 mEq/L — ABNORMAL HIGH (ref 3.5–5.1)
Potassium: 6.5 mEq/L (ref 3.5–5.1)
Sodium: 137 mEq/L (ref 135–145)
Sodium: 137 mEq/L (ref 135–145)

## 2012-02-07 LAB — CBC
HCT: 32.3 % — ABNORMAL LOW (ref 39.0–52.0)
HCT: 30.3 % — ABNORMAL LOW (ref 39.0–52.0)
Hemoglobin: 11.2 g/dL — ABNORMAL LOW (ref 13.0–17.0)
Hemoglobin: 10.6 g/dL — ABNORMAL LOW (ref 13.0–17.0)
MCH: 25 pg — ABNORMAL LOW (ref 26.0–34.0)
MCH: 25.2 pg — ABNORMAL LOW (ref 26.0–34.0)
MCHC: 34.7 g/dL (ref 30.0–36.0)
MCHC: 35 g/dL (ref 30.0–36.0)
MCV: 72.1 fL — ABNORMAL LOW (ref 78.0–100.0)
MCV: 72 fL — ABNORMAL LOW (ref 78.0–100.0)
Platelets: 218 10*3/uL (ref 150–400)
Platelets: 240 10*3/uL (ref 150–400)
RBC: 4.48 MIL/uL (ref 4.22–5.81)
RBC: 4.21 MIL/uL — ABNORMAL LOW (ref 4.22–5.81)
RDW: 16.8 % — ABNORMAL HIGH (ref 11.5–15.5)
RDW: 17.2 % — ABNORMAL HIGH (ref 11.5–15.5)
WBC: 14.1 10*3/uL — ABNORMAL HIGH (ref 4.0–10.5)
WBC: 16.3 10*3/uL — ABNORMAL HIGH (ref 4.0–10.5)

## 2012-02-07 LAB — GLUCOSE, CAPILLARY: Glucose-Capillary: 153 mg/dL — ABNORMAL HIGH (ref 70–99)

## 2012-02-07 LAB — HEMOGLOBIN A1C
Hgb A1c MFr Bld: 5.9 % — ABNORMAL HIGH (ref <5.7)
Mean Plasma Glucose: 123 mg/dL — ABNORMAL HIGH (ref <117)

## 2012-02-07 MED ORDER — SODIUM CHLORIDE 0.9 % IV BOLUS (SEPSIS)
500.0000 mL | Freq: Once | INTRAVENOUS | Status: AC
  Administered 2012-02-07: 500 mL via INTRAVENOUS

## 2012-02-07 MED ORDER — SODIUM BICARBONATE 8.4 % IV SOLN
50.0000 meq | Freq: Once | INTRAVENOUS | Status: DC
  Filled 2012-02-07: qty 50

## 2012-02-07 MED ORDER — SODIUM CHLORIDE 0.9 % IV SOLN
Freq: Once | INTRAVENOUS | Status: AC
  Administered 2012-02-07: 20:00:00 via INTRAVENOUS

## 2012-02-07 MED ORDER — DEXTROSE 50 % IV SOLN
INTRAVENOUS | Status: AC
  Filled 2012-02-07: qty 50

## 2012-02-07 MED ORDER — SODIUM CHLORIDE 0.9 % IV BOLUS (SEPSIS)
1000.0000 mL | Freq: Once | INTRAVENOUS | Status: AC
  Administered 2012-02-07: 1000 mL via INTRAVENOUS

## 2012-02-07 MED ORDER — DEXTROSE 50 % IV SOLN
1.0000 | Freq: Once | INTRAVENOUS | Status: DC
  Filled 2012-02-07: qty 50

## 2012-02-07 MED ORDER — SODIUM POLYSTYRENE SULFONATE 15 GM/60ML PO SUSP
30.0000 g | Freq: Once | ORAL | Status: AC
  Administered 2012-02-07: 30 g via RECTAL
  Filled 2012-02-07: qty 120

## 2012-02-07 MED ORDER — INSULIN REGULAR HUMAN 100 UNIT/ML IJ SOLN
10.0000 [IU] | Freq: Once | INTRAMUSCULAR | Status: DC
  Filled 2012-02-07: qty 0.1

## 2012-02-07 MED ORDER — SODIUM CHLORIDE 0.9 % IV SOLN
INTRAVENOUS | Status: DC
  Administered 2012-02-07 – 2012-02-09 (×5): via INTRAVENOUS

## 2012-02-07 NOTE — Consult Note (Signed)
[facility] Medical Consultation  [name] [id number] DOB: 1951/03/08 DOA: 02/06/2012 PCP: [name], MD   Requesting physician: Dr. Terri Piedra Date of consultation: 02/07/2012 Reason for consultation: Hyperkalemia and ARF Impression/Recommendations Active Problems:  Hyperkalemia  ARF (acute renal failure)   Recommendations:  1.  Continue Rehydration, and Monitor Bun/Cr, and K+ Levels. 2.  Kayexalate Enema 30 gram PR X 1 given Recheck K+ level 3.  Administer Insulin Dextrose, and Bicarb IV X 1 for short Term Rx for Hyperkalemia 4.  Monitor on Telemetry while K+ Level is > 5.2.   5.  Medications  Reviewed, and Avapro discontinued for now due to hyperkalemia and ARF.   6. The Hospitlalist Team will continue to follow daily.  Thank You for this Consultation.     Chief Complaint:  Elevated K+ level and Increasing Bun/Cr   HPI: [name] is a 61 year old male with a history of DM2, and HTN who recently underwent colonoscopy by [name]. He was found to have a 3-4 cm sessile mass in the distal transverse colon which was marked with ink. Biopsy showed high-grade dysplasia with no sign of malignancy. He presented on 02/06/2012 for surgical resection. Following surgery he had worsening of his BUN/Cr and elevation in his potassium levels.  An Internal Medicine consultation was requested for further evaluation of the patient due to the ARF and Hyperkalemia.  Pt denies any chest pain, or palpitations. He also denies any headache, fever or chills. He does have some mild pain at his incision site.     Review of Systems:   The patient denies anorexia, fever, weight loss, vision loss, decreased hearing, hoarseness, chest pain, syncope, dyspnea on exertion, peripheral edema, balance deficits, hemoptysis, abdominal pain, melena, hematochezia, severe indigestion/heartburn, hematuria, incontinence, genital sores, muscle weakness, suspicious skin lesions, transient  blindness, difficulty walking, depression, unusual weight change, abnormal bleeding, enlarged lymph nodes, angioedema, and breast masses.     Past Medical History  Diagnosis Date  . Hyperlipidemia   . Hypertension   . Heart murmur   . Diabetes mellitus     type 2 IDDM x 15 years  . Sleep apnea     does not wear CPAP now  . Colon polyp   . Arthritis     left knee   Past Surgical History  Procedure Date  . Colonoscopy   . Ganglion cyst excision 20 YRS AGO    RT ARM  . Partial colectomy 02/06/2012  . Partial colectomy 02/06/2012    Procedure: PARTIAL COLECTOMY;  Surgeon: Imogene Burn. [name], MD;  Location: [place];  Service: General;  Laterality: N/A;  right partial colectomy  . Colonoscopy 02/06/2012    Procedure: COLONOSCOPY;  Surgeon: Milus Banister, MD;  Location: [place];  Service: Endoscopy;;   Social History:  reports that he has been smoking Cigarettes.  He has a 15 pack-year smoking history. He has never used smokeless tobacco. He reports that he drinks alcohol. He reports that he does not use illicit drugs.  No Known Allergies Family History  Problem Relation Age of Onset  . Diabetes Father   . Heart disease Father   . Colon cancer Neg Hx   . Diabetes Sister   . Heart disease Sister   . Heart disease Mother     Prior to Admission medications   Medication Sig Start Date End Date Taking? Authorizing Provider  atorvastatin (LIPITOR) 20 MG tablet Take 20 mg by mouth at bedtime.   Yes Historical Provider,  MD  insulin NPH-insulin regular (NOVOLIN 70/30) (70-30) 100 UNIT/ML injection Inject 10-30 Units into the skin 2 (two) times daily with a meal. Inject 30 units in the morning and 10 units at bedtime.   Yes Historical Provider, MD  metFORMIN (GLUCOPHAGE) 500 MG tablet Take 1,000 mg by mouth 2 (two) times daily with a meal.   Yes Historical Provider, MD  valsartan (DIOVAN) 80 MG tablet Take 40 mg by mouth daily.   Yes Historical Provider, MD   Physical Exam: Blood pressure  126/63, pulse 87, temperature 99.7 F (37.6 C), temperature source Oral, resp. rate 19, height 5\' 11"  (1.803 m), weight 112.492 kg (248 lb), SpO2 92.00%. Filed Vitals:   02/07/12 1300 02/07/12 1347 02/07/12 1803 02/07/12 2007  BP: 133/80 116/88 126/63   Pulse:  106 87   Temp:  98.8 F (37.1 C) 99.7 F (37.6 C)   TempSrc:  Oral Oral   Resp:  24 14 19   Height:      Weight:      SpO2:  90% 92% 92%     GEN: Pleasant examined  and in no acute distress; cooperative with exam    Blood pressure 126/63, pulse 87, temperature 99.7 F (37.6 C), temperature source Oral, resp. rate 19, height 5\' 11"  (1.803 m), weight 112.492 kg (248 lb), SpO2 92.00%. PSYCH: He is alert and oriented x4; does not appear anxious does not appear depressed; affect is normal HEENT: Normocephalic and Atraumatic, Mucous membranes pink; PERRLA; EOM intact; Fundi:  Benign;  No scleral icterus, Nares: Patent, Oropharynx: Clear, Fair Dentition, Neck:  FROM, no cervical lymphadenopathy nor thyromegaly or carotid bruit; no JVD; Breasts:: Not examined CHEST WALL: No tenderness CHEST: Normal respiration, clear to auscultation bilaterally HEART: Regular rate and rhythm; no murmurs rubs or gallops BACK: No kyphosis or scoliosis; no CVA tenderness ABDOMEN: Positive Bowel Sounds, Obese, soft  Mildly tender around surgical site of Right ABD,  no masses, no organomegaly,  Rectal Exam: Not done EXTREMITIES: No bone or joint deformity; age-appropriate arthropathy of the hands and knees; no cyanosis, clubbing or edema; no ulcerations. Genitalia: not examined PULSES: 2+ and symmetric SKIN: Normal hydration no rash or ulceration CNS: Cranial nerves 2-12 grossly intact no focal neurologic deficit   Labs on Admission:  Basic Metabolic Panel:  Lab 0000000 1813 02/07/12 0624  NA 137 137  K 6.5* 5.4*  CL 105 105  CO2 24 24  GLUCOSE 151* 175*  BUN 39* 29*  CREATININE 2.88* 2.34*  CALCIUM 8.0* 7.9*  MG -- --  PHOS -- --   Liver  Function Tests: No results found for this basename: AST:5,ALT:5,ALKPHOS:5,BILITOT:5,PROT:5,ALBUMIN:5 in the last 168 hours No results found for this basename: LIPASE:5,AMYLASE:5 in the last 168 hours No results found for this basename: AMMONIA:5 in the last 168 hours CBC:  Lab 02/07/12 1813 02/07/12 0624  WBC 16.3* 14.1*  NEUTROABS -- --  HGB 10.6* 11.2*  HCT 30.3* 32.3*  MCV 72.0* 72.1*  PLT 240 218   Cardiac Enzymes: No results found for this basename: CKTOTAL:5,CKMB:5,CKMBINDEX:5,TROPONINI:5 in the last 168 hours BNP: No components found with this basename: POCBNP:5 CBG:  Lab 02/07/12 0752 02/06/12 2128 02/06/12 1702 02/06/12 1149 02/06/12 0630  GLUCAP 153* 194* 187* 141* 79    Radiological Exams on Admission: No results found.  EKG: Independently reviewed. No tenting t waves, NSR, and mild ST depression in Inferior Leads.    Time spent: 60 minutes  [facility] Pager [phone]  If 7PM-7AM, please  contact night-coverage [email or web address] Password [email or web address] 02/07/2012, 9:09 PM

## 2012-02-07 NOTE — Progress Notes (Signed)
Cosign for [facility] RN med administration, note(s), I/O, care plan/education.  Ruben Im RN

## 2012-02-07 NOTE — Progress Notes (Signed)
Pt urine output only 110cc since 1900 9/5. MD on call, Grandville Silos made aware. Orders for 1L NS bolus to be given.

## 2012-02-07 NOTE — Progress Notes (Signed)
NP has given orders to give patient 500 cc bolus of normal saline and surgeon Rosenbower currently on unit and has ordered foley to be reinserted and labs to be drawn; will continue to monitor patient.  Ruben Im RN

## 2012-02-07 NOTE — Progress Notes (Signed)
Foley catheter has been placed and 500 cc bolus per MD.  NP has been back to unit to assess patient; will continue to monitor patient.  Ruben Im RN

## 2012-02-07 NOTE — Progress Notes (Signed)
Patient's foley was discontinued this morning per MD order, patient has currently not voided since removal of foley catheter, MD notified and no orders given at this time; will continue to monitor patient.  Ruben Im RN

## 2012-02-07 NOTE — Progress Notes (Signed)
Patient has been bladder scanned and has only 15cc; will continue to monitor patient  D. Owens Shark RN

## 2012-02-07 NOTE — Progress Notes (Signed)
In & out cath performed on pt with no return of urine. MD notified. No orders given at this time. Will continue to monitor pt.  Velta Addison

## 2012-02-07 NOTE — Progress Notes (Signed)
Pt refused to take his sodium bicarbonate and the dextrose 50%. Pt stated that he has been feeling bad every since receiving kayexalate. RN called MD about pt refusing meds.

## 2012-02-07 NOTE — Progress Notes (Signed)
Patient has not voided since foley catheter was removed this morning, per NP patient will have to be in/out cathed if he does not void by 1600-1630; will continue to monitor patient.  Ruben Im RN

## 2012-02-07 NOTE — Progress Notes (Signed)
Orthopedic Tech Progress Note Patient Details:  [name] [birth year] [id number] Nurse called Sheliah Hatch for abdominal binder. Binder delivered to nurse at approx. 10:45am. Order found in Nursing orders. Ortho Devices Type of Ortho Device: Abdominal binder Ortho Device/Splint Interventions: Ordered   [place] R Thompson 02/07/2012, 10:48 AM

## 2012-02-07 NOTE — Progress Notes (Signed)
Second bladder scan performed on patient and there was no urine in bladder; MD notified and awaiting call will continue to monitor patient.  Ruben Im RN

## 2012-02-07 NOTE — Progress Notes (Signed)
CRITICAL VALUE ALERT  Critical value received:  6.5  Date of notification:  02/07/12  Time of notification:  1925  Critical value read back:yes  Nurse who received alert:  Lenis Dickinson RN  MD notified (1st page):  Tsuei  Time of first page:  1930  MD notified (2nd page):  Time of second page:  Responding MD:  [name] MD  Time MD responded:  [phone]

## 2012-02-08 ENCOUNTER — Inpatient Hospital Stay (HOSPITAL_COMMUNITY): Payer: Medicaid Other

## 2012-02-08 LAB — BASIC METABOLIC PANEL
BUN: 38 mg/dL — ABNORMAL HIGH (ref 6–23)
CO2: 23 mEq/L (ref 19–32)
Calcium: 7.9 mg/dL — ABNORMAL LOW (ref 8.4–10.5)
Chloride: 107 mEq/L (ref 96–112)
Creatinine, Ser: 2.26 mg/dL — ABNORMAL HIGH (ref 0.50–1.35)
GFR calc Af Amer: 35 mL/min — ABNORMAL LOW (ref >90)
GFR calc non Af Amer: 30 mL/min — ABNORMAL LOW (ref >90)
Glucose, Bld: 167 mg/dL — ABNORMAL HIGH (ref 70–99)
Potassium: 5.1 mEq/L (ref 3.5–5.1)
Sodium: 139 mEq/L (ref 135–145)

## 2012-02-08 LAB — GLUCOSE, CAPILLARY
Glucose-Capillary: 100 mg/dL — ABNORMAL HIGH (ref 70–99)
Glucose-Capillary: 159 mg/dL — ABNORMAL HIGH (ref 70–99)
Glucose-Capillary: 136 mg/dL — ABNORMAL HIGH (ref 70–99)
Glucose-Capillary: 127 mg/dL — ABNORMAL HIGH (ref 70–99)
Glucose-Capillary: 126 mg/dL — ABNORMAL HIGH (ref 70–99)

## 2012-02-08 LAB — CBC
HCT: 27.8 % — ABNORMAL LOW (ref 39.0–52.0)
Hemoglobin: 9.6 g/dL — ABNORMAL LOW (ref 13.0–17.0)
MCH: 24.7 pg — ABNORMAL LOW (ref 26.0–34.0)
MCHC: 34.5 g/dL (ref 30.0–36.0)
MCV: 71.6 fL — ABNORMAL LOW (ref 78.0–100.0)
Platelets: 193 10*3/uL (ref 150–400)
RBC: 3.88 MIL/uL — ABNORMAL LOW (ref 4.22–5.81)
RDW: 17 % — ABNORMAL HIGH (ref 11.5–15.5)
WBC: 18.3 10*3/uL — ABNORMAL HIGH (ref 4.0–10.5)

## 2012-02-08 IMAGING — CR DG ABDOMEN 2V
2 series · 2 of 2 positions shown · non-contrast
Comparison: No priors.

CLINICAL DATA: Status post partial colectomy.  Evaluate for
potential postoperative ileus.

ABDOMEN - 2 VIEW

[w abdomen decub]
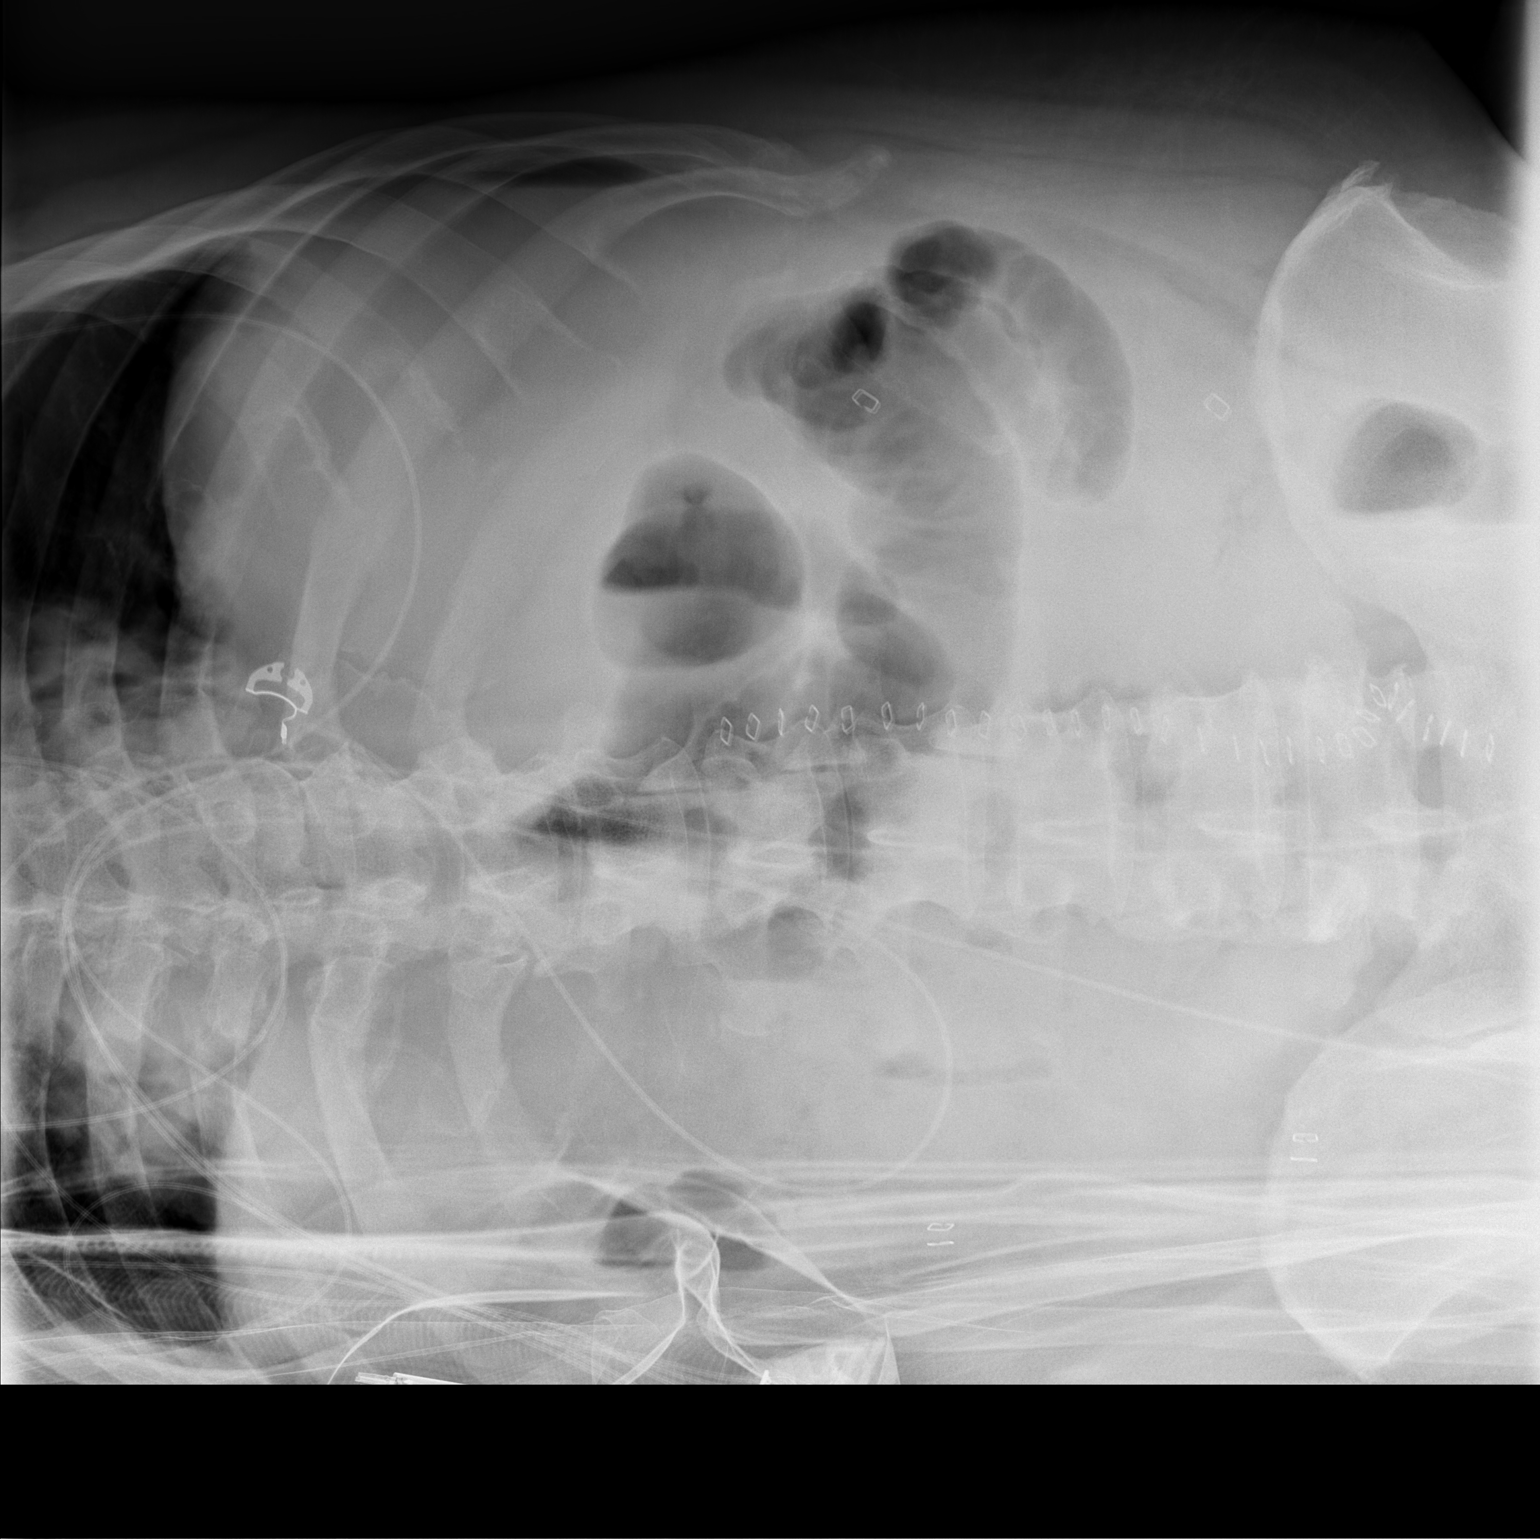

[t abdomen supine]
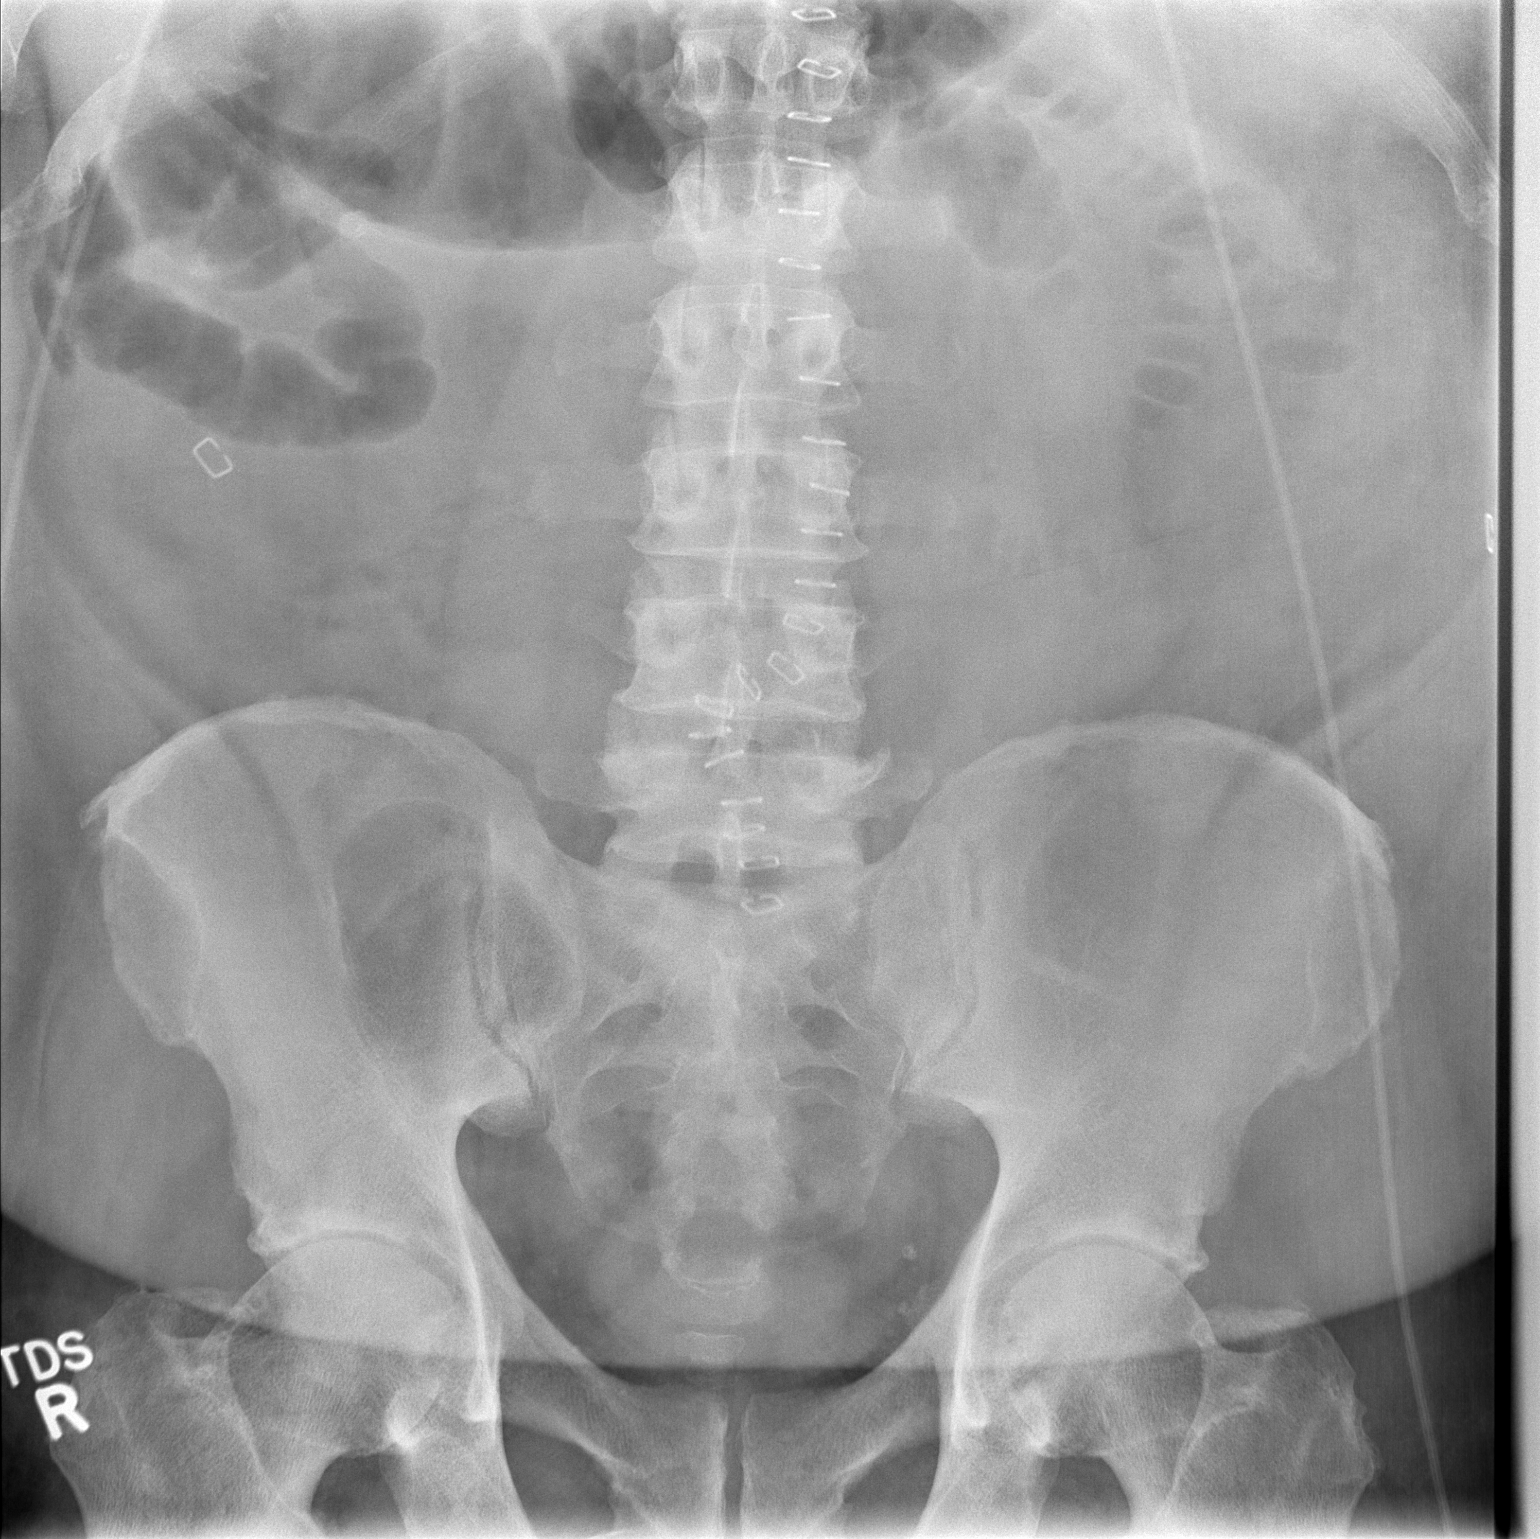

[2 of 2 positions shown; findings below may reference images not displayed]

FINDINGS: There is a paucity of bowel gas.  There appear to be some
dilated loops of bowel in the right upper quadrant of the abdomen
measuring up to 5.1 cm in diameter.  These are not clearly colonic,
and are suspicious for dilated loops of small bowel.  Some air
fluid levels are noted on the left lateral decubitus view.  No
gross pneumoperitoneum. A small volume of gas is present within the
stomach.  Multiple midline surgical staples and a couple of right-
sided surgical staples are also noted.
IMPRESSION: 1.  Nonspecific bowel gas pattern, as above, concerning for
potential small bowel obstruction. This may alternatively represent
a regional small bowel ileus.
2.  No pneumoperitoneum.

to Dr. RUDI, who verbally acknowledged these results.

## 2012-02-08 IMAGING — CR DG CHEST 2V
2 series · 2 of 2 positions shown · non-contrast
Comparison: Chest x-ray [DATE].

CLINICAL DATA: Shortness of breath, congestion, fever and dyspnea.
Status post colectomy.

CHEST - 2 VIEW

[w chest lat]
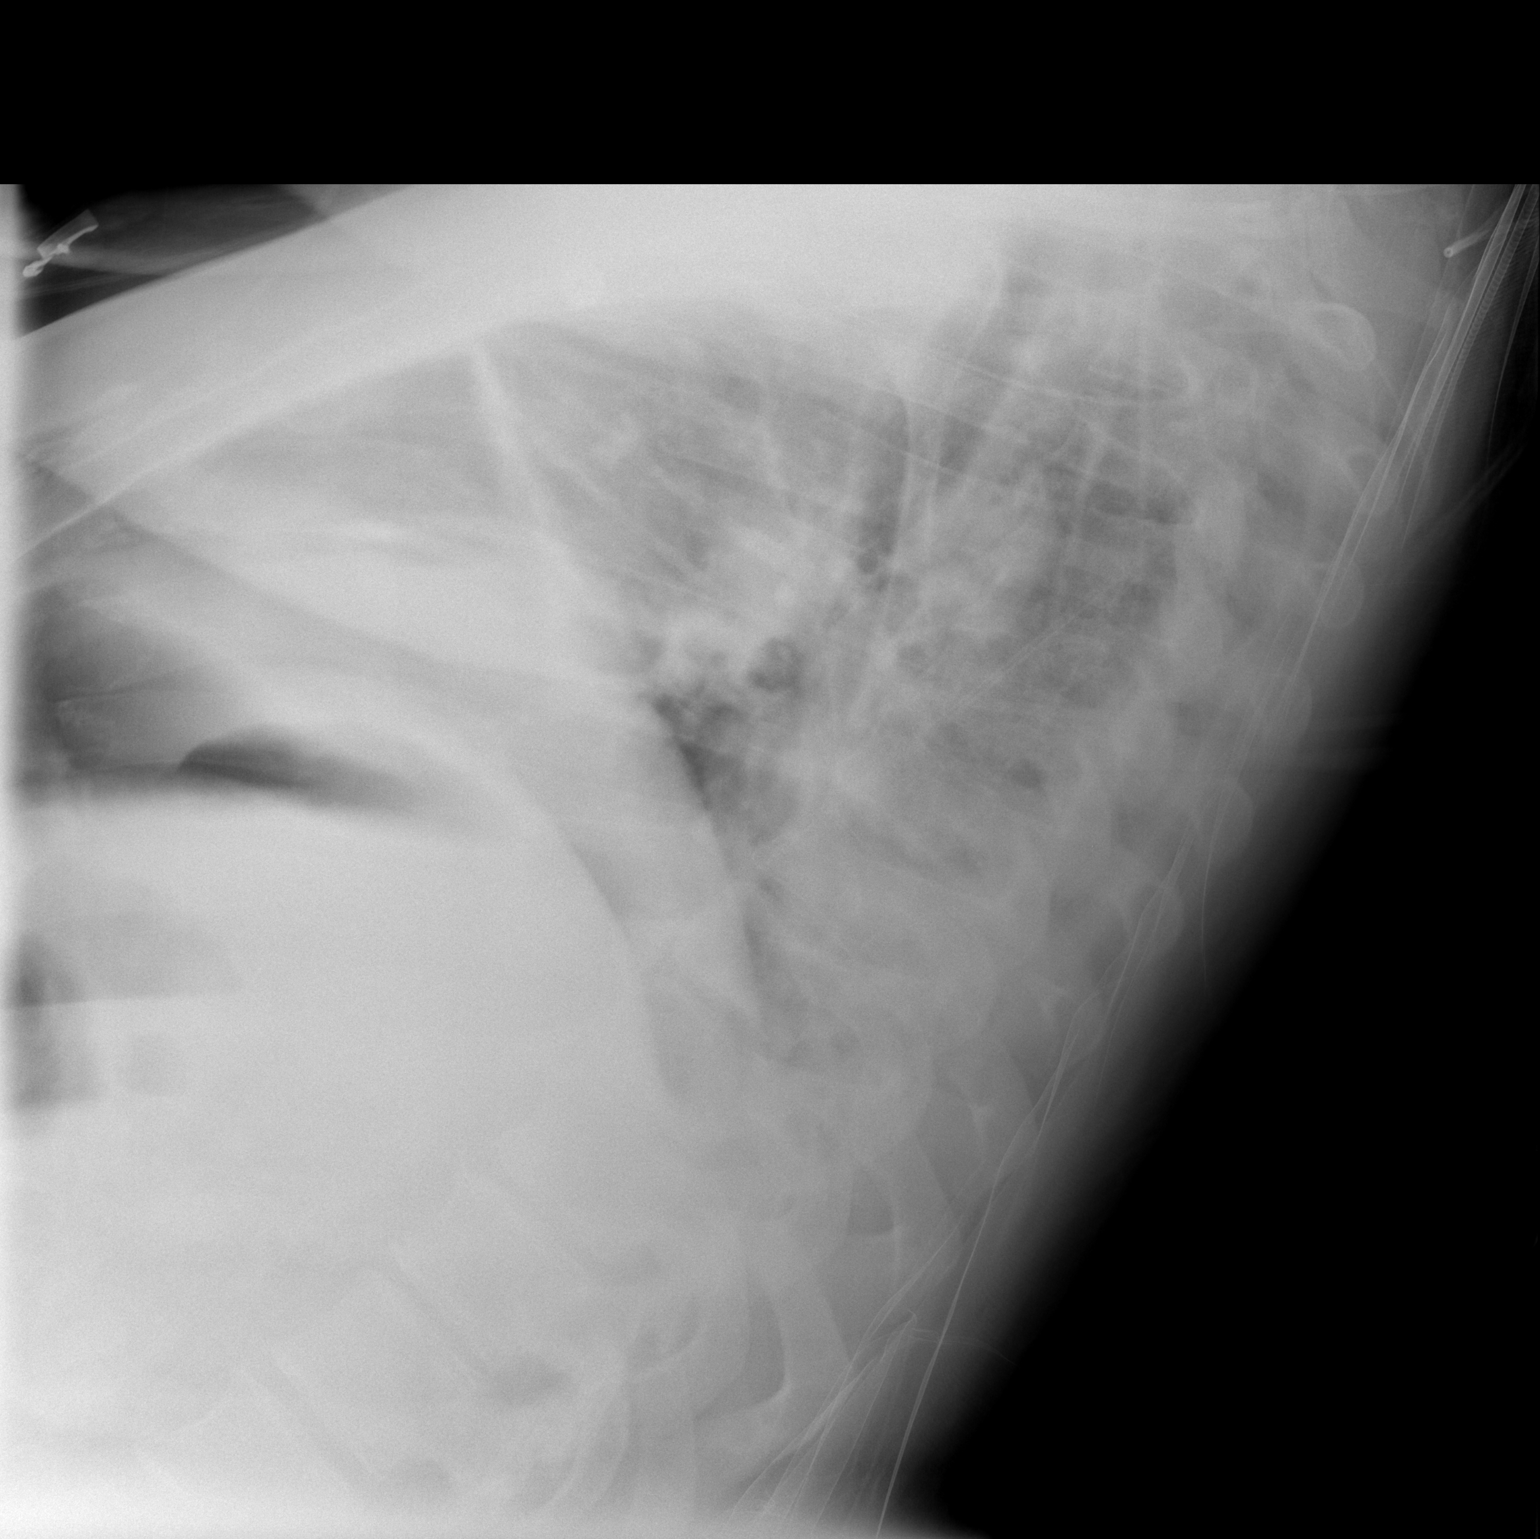

[t chest supine]
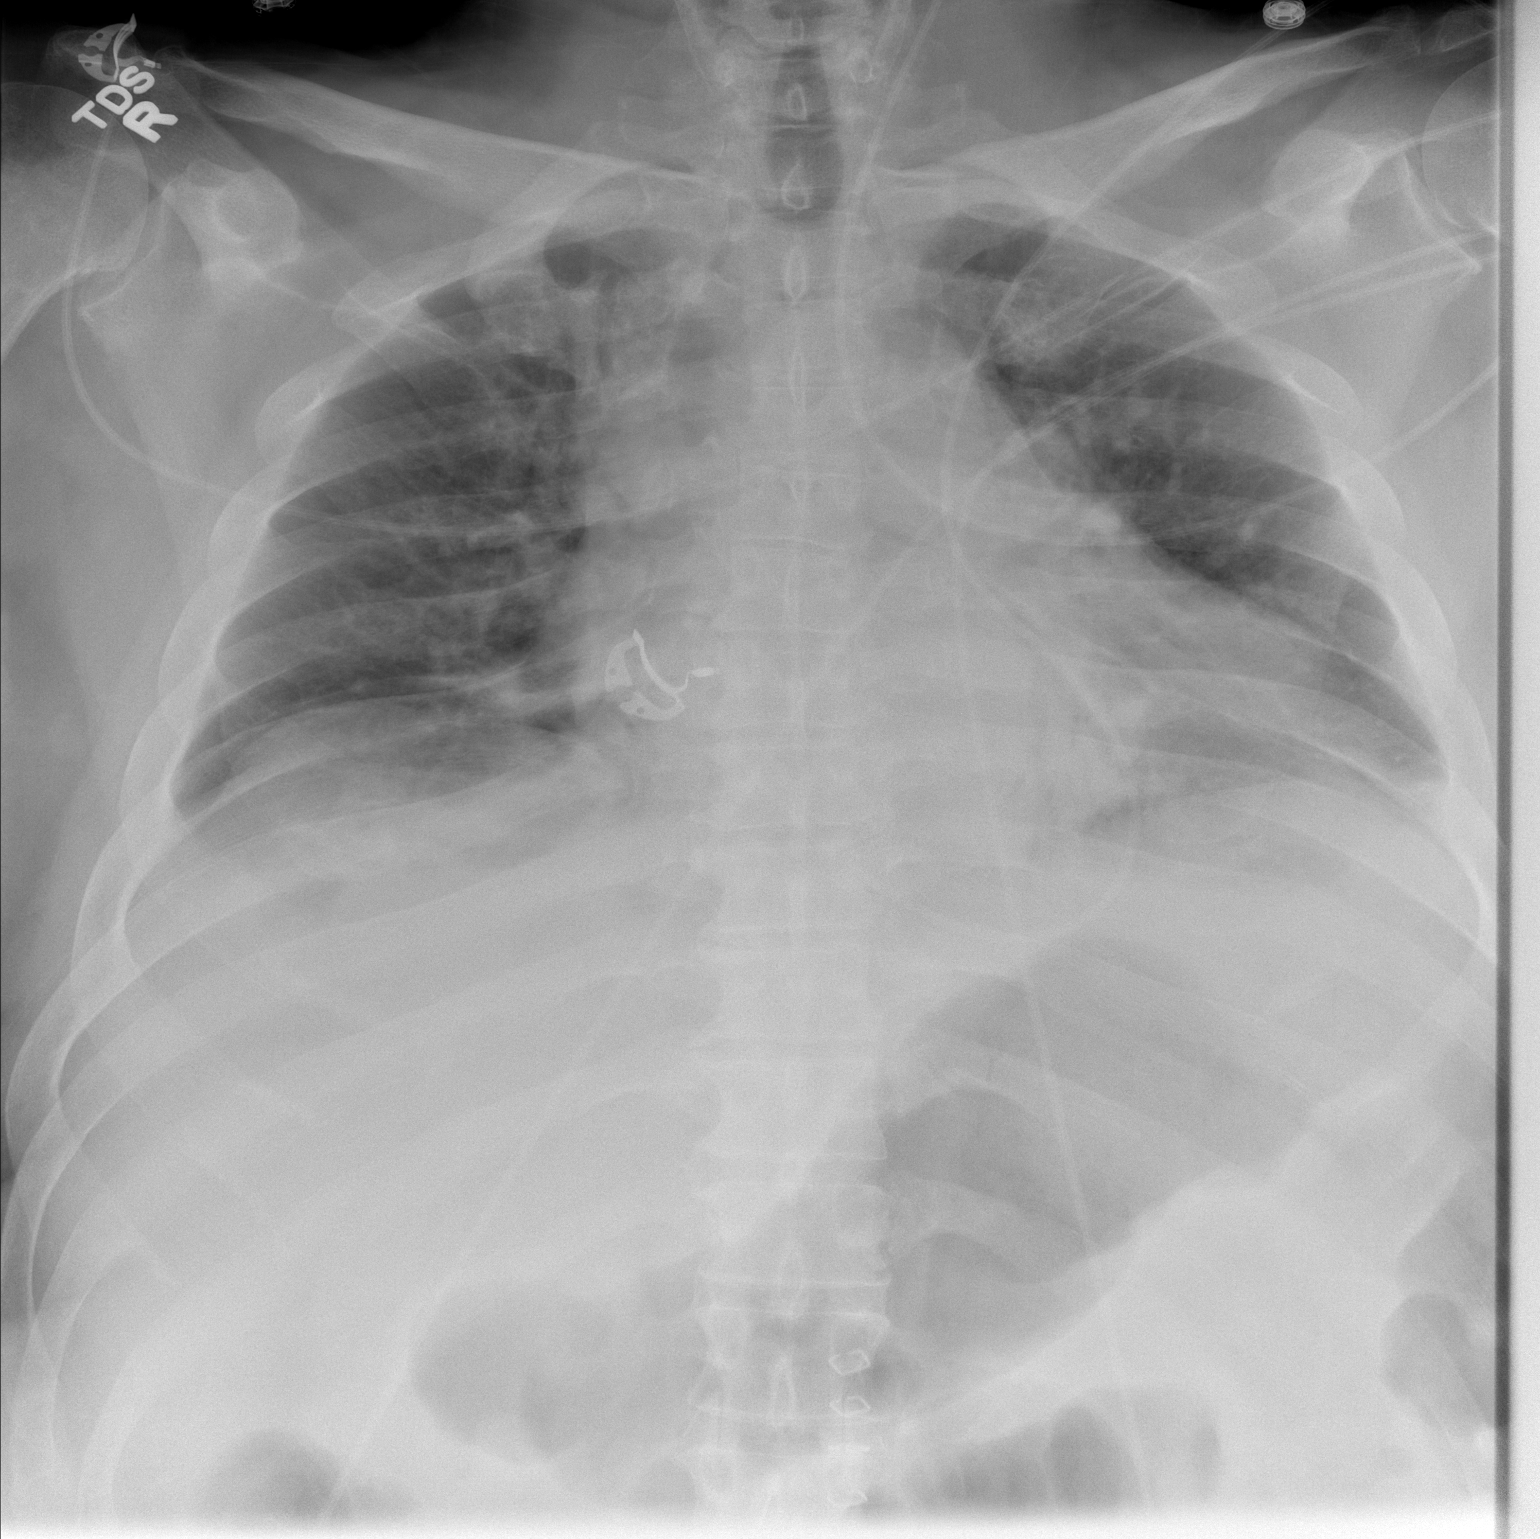

[2 of 2 positions shown; findings below may reference images not displayed]

FINDINGS: Lung volumes are low.  The patient is in a very lordotic
position.  With these limitations in mind, there appears to be an
opacity in the medial aspect of the left lower lobe with some air
bronchograms. Trace bilateral pleural effusions.  Mild crowding of
the pulmonary vasculature, accentuated by patient's low lung
volumes, without frank pulmonary edema.  Heart size appears
borderline enlarged, likely accentuated by patient positioning.
Mediastinal contours appear widened, also likely related to low
lung volumes and lordotic positioning.
IMPRESSION: 1.  Low lung volumes with atelectasis and/or consolidation in the
medial aspect of the left lower lobe.
2.  Trace bilateral pleural effusions.

## 2012-02-08 MED ORDER — PANTOPRAZOLE SODIUM 40 MG IV SOLR
40.0000 mg | Freq: Two times a day (BID) | INTRAVENOUS | Status: DC
  Administered 2012-02-08 – 2012-02-09 (×4): 40 mg via INTRAVENOUS
  Filled 2012-02-08 (×6): qty 40

## 2012-02-08 NOTE — Progress Notes (Signed)
I went back over the Pros and Cons with the Pt about taking his sodium bicarbonate and D50 after talking to Dr. [name]. Pt still refused to take his sodium bicarbonate and D50. Pt stated that "its in God's hands now."

## 2012-02-08 NOTE — Progress Notes (Signed)
2 Days Post-Op  Subjective: Does not feel well today.. Having a lot of belching and indigestion. Considerable abdominal pain but mainly incisional. Not passing any gas or had a bowel movement.  Objective: Vital signs in last 24 hours: Temp:  [97.6 F (36.4 C)-99.7 F (37.6 C)] 99.2 F (37.3 C) (09/07 0516) Pulse Rate:  [87-115] 109  (09/07 0516) Resp:  [13-29] 20  (09/07 0800) BP: (116-156)/(62-88) 156/72 mmHg (09/07 0516) SpO2:  [90 %-95 %] 94 % (09/07 0800)   Intake/Output from previous day: 09/06 0701 - 09/07 0700 In: 8564.8 [P.O.:240; I.V.:7822.8; IV Piggyback:502] Out: 1075 [Urine:1075] Intake/Output this shift:     General appearance: alert, cooperative, fatigued and mild distress Resp: diminished breath sounds base - bilateral Cardio: regular rate and rhythm, S1, S2 normal, no murmur, click, rub or gallop and persistent tachycardia GI: Mildly distended, tender particularly in midline incision. Rest of abdomen seems soft. No bowel sounds. No rebound.  Incision: no significant drainage  Lab Results:   Basename 02/08/12 0520 02/07/12 1813  WBC 18.3* 16.3*  HGB 9.6* 10.6*  HCT 27.8* 30.3*  PLT 193 240   BMET  Basename 02/08/12 0520 02/07/12 1813  NA 139 137  K 5.1 6.5*  CL 107 105  CO2 23 24  GLUCOSE 167* 151*  BUN 38* 39*  CREATININE 2.26* 2.88*  CALCIUM 7.9* 8.0*   PT/INR No results found for this basename: LABPROT:2,INR:2 in the last 72 hours ABG No results found for this basename: PHART:2,PCO2:2,PO2:2,HCO3:2 in the last 72 hours  MEDS, Scheduled    . sodium chloride   Intravenous Once  . alvimopan  12 mg Oral BID  . atorvastatin  20 mg Oral QHS  . dextrose  1 ampule Intravenous Once  . enoxaparin (LOVENOX) injection  40 mg Subcutaneous Q24H  . HYDROmorphone PCA 0.3 mg/mL   Intravenous Q4H  . insulin aspart  0-15 Units Subcutaneous TID WC  . insulin aspart  0-5 Units Subcutaneous QHS  . insulin aspart  4 Units Subcutaneous TID WC  . insulin  glargine  5 Units Subcutaneous QHS  . insulin regular  10 Units Subcutaneous Once  . sodium bicarbonate  50 mEq Intravenous Once  . sodium chloride  500 mL Intravenous Once  . sodium polystyrene  30 g Rectal Once  . DISCONTD: irbesartan  75 mg Oral Daily    Studies/Results: No results found.  Assessment: s/p Procedure(s): LAPAROSCOPIC PARTIAL COLECTOMY PARTIAL COLECTOMY COLONOSCOPY Patient Active Problem List  Diagnosis  . Type II diabetes mellitus, well controlled  . Hyperlipidemia LDL goal <70  . MICROCYTIC ANEMIA  . TOBACCO ABUSE  . OBSTRUCTIVE SLEEP APNEA  . HYPERTENSION  . H/O colonoscopy with polypectomy  . Cataract, bilateral  . Obesity (BMI 30.0-34.9)  . Health care maintenance  . Dysplastic polyp of colon - distal transverse  . Left knee pain  . Hyperkalemia  . ARF (acute renal failure)    Postop right hemicolectomy Acute renal failure, stable with increasing urinary output Indigestion, belching and abdominal pain, possible gastric distention Dyspnea, Positive fluid balance over the last 2 days  Plan: Will continue n.p.o. today. We'll check chest x-ray because of his dyspnea and to be sure he is not fluid overloaded. Will check KUB and upright to be sure he doesn't have significant gastric distention. We'll recheck white count in morning as well as renal function studies.   LOS: 2 days     Haywood Lasso, MD, [facility] Surgery, [place]  02/08/2012 10:21 AM

## 2012-02-09 ENCOUNTER — Encounter (HOSPITAL_COMMUNITY): Payer: Self-pay | Admitting: Surgery

## 2012-02-09 LAB — GLUCOSE, CAPILLARY
Glucose-Capillary: 154 mg/dL — ABNORMAL HIGH (ref 70–99)
Glucose-Capillary: 135 mg/dL — ABNORMAL HIGH (ref 70–99)
Glucose-Capillary: 121 mg/dL — ABNORMAL HIGH (ref 70–99)
Glucose-Capillary: 129 mg/dL — ABNORMAL HIGH (ref 70–99)
Glucose-Capillary: 124 mg/dL — ABNORMAL HIGH (ref 70–99)

## 2012-02-09 LAB — BASIC METABOLIC PANEL
BUN: 28 mg/dL — ABNORMAL HIGH (ref 6–23)
CO2: 24 mEq/L (ref 19–32)
Calcium: 8.3 mg/dL — ABNORMAL LOW (ref 8.4–10.5)
Chloride: 108 mEq/L (ref 96–112)
Creatinine, Ser: 1.51 mg/dL — ABNORMAL HIGH (ref 0.50–1.35)
GFR calc Af Amer: 56 mL/min — ABNORMAL LOW (ref >90)
GFR calc non Af Amer: 49 mL/min — ABNORMAL LOW (ref >90)
Glucose, Bld: 148 mg/dL — ABNORMAL HIGH (ref 70–99)
Potassium: 4.2 mEq/L (ref 3.5–5.1)
Sodium: 139 mEq/L (ref 135–145)

## 2012-02-09 LAB — CBC
HCT: 25.2 % — ABNORMAL LOW (ref 39.0–52.0)
Hemoglobin: 8.9 g/dL — ABNORMAL LOW (ref 13.0–17.0)
MCH: 25 pg — ABNORMAL LOW (ref 26.0–34.0)
MCHC: 35.3 g/dL (ref 30.0–36.0)
MCV: 70.8 fL — ABNORMAL LOW (ref 78.0–100.0)
Platelets: 181 10*3/uL (ref 150–400)
RBC: 3.56 MIL/uL — ABNORMAL LOW (ref 4.22–5.81)
RDW: 16.7 % — ABNORMAL HIGH (ref 11.5–15.5)
WBC: 13.8 10*3/uL — ABNORMAL HIGH (ref 4.0–10.5)

## 2012-02-09 MED ORDER — MORPHINE SULFATE 2 MG/ML IJ SOLN
2.0000 mg | INTRAMUSCULAR | Status: DC | PRN
  Administered 2012-02-10 – 2012-02-12 (×4): 2 mg via INTRAVENOUS
  Administered 2012-02-12 – 2012-02-13 (×2): 4 mg via INTRAVENOUS
  Filled 2012-02-09 (×3): qty 1
  Filled 2012-02-09: qty 2
  Filled 2012-02-09: qty 1
  Filled 2012-02-09: qty 2
  Filled 2012-02-09: qty 1

## 2012-02-09 NOTE — Consult Note (Signed)
Medicine Consultation Note Date: 02/09/2012   Patient name: [name] Medical record number: [id number] Date of birth: 09-Sep-1950 Age: 61 y.o. Gender: male PCP: [name], MD   Requesting physician: Dr. Terri Piedra  Reason for consultation: Hyperkalemia and ARF   Service:  Internal Medicine Teaching Service  Attending Physician:  Dr. Margot Ables   Chief Complaint:  AKI and hyperkalemia post-operatively   History of Present Illness:  [name] is a 61 year old male with a history of DM2, and HTN who recently underwent colonoscopy by [name]. He was found to have a 3-4 cm sessile mass in the distal transverse colon which was marked with ink. Biopsy showed high-grade dysplasia with no sign of malignancy. He presented on 02/06/2012 for surgical resection. Following surgery he had worsening of his BUN/Cr and elevation in his potassium levels. An Internal Medicine consultation was requested for further evaluation of the patient due to the ARF and Hyperkalemia.  [facility] hospitalists were initially consulted on 9/6.  At that time, the patient's creatinine was 2.88 (baseline 1.1), BUN was 39, and potassium was 6.5.  They recommended Kayexalate, insulin and dextrose, bicarbonate, and discontinuation of the patient's irbesartan.  Since then, creatinine has trended down to 1.51 and potassium has normalized to 4.2.     Review of Systems:  The patient's primary complaint is abdominal pain.  He did have a small bowel movement this morning; his first one post-operatively.  He denies other aches and pains, dyspnea, nausea, vomiting, and chest pain.   Past Medical History: Past Medical History  Diagnosis Date  . Hyperlipidemia   . Hypertension   . Heart murmur   . Diabetes mellitus     type 2 IDDM x 15 years  . Sleep apnea     does not wear CPAP now  . Colon polyp   . Arthritis     left knee  . 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 12/25/2011   Past Surgical History    Procedure Date  . Colonoscopy   . Ganglion cyst excision 20 YRS AGO    RT ARM  . Partial colectomy 02/06/2012  . Partial colectomy 02/06/2012    Procedure: PARTIAL COLECTOMY;  Surgeon: Imogene Burn. [name], MD;  Location: [place];  Service: General;  Laterality: N/A;  right partial colectomy  . Colonoscopy 02/06/2012    Procedure: COLONOSCOPY;  Surgeon: Milus Banister, MD;  Location: [place];  Service: Endoscopy;;    Home Medications: Medications Prior to Admission  Medication Sig Dispense Refill  . atorvastatin (LIPITOR) 20 MG tablet Take 20 mg by mouth at bedtime.      . insulin NPH-insulin regular (NOVOLIN 70/30) (70-30) 100 UNIT/ML injection Inject 10-30 Units into the skin 2 (two) times daily with a meal. Inject 30 units in the morning and 10 units at bedtime.      . metFORMIN (GLUCOPHAGE) 500 MG tablet Take 1,000 mg by mouth 2 (two) times daily with a meal.      . valsartan (DIOVAN) 80 MG tablet Take 40 mg by mouth daily.        Current hospital medications: Continuous infusions    . sodium chloride 100 mL/hr at 02/09/12 0253   Scheduled Medications:    . alvimopan  12 mg Oral BID  . atorvastatin  20 mg Oral QHS  . dextrose  1 ampule Intravenous Once  . enoxaparin (LOVENOX) injection  40 mg Subcutaneous Q24H  . HYDROmorphone PCA 0.3 mg/mL   Intravenous Q4H  .  insulin aspart  0-15 Units Subcutaneous TID WC  . insulin aspart  0-5 Units Subcutaneous QHS  . insulin aspart  4 Units Subcutaneous TID WC  . insulin glargine  5 Units Subcutaneous QHS  . insulin regular  10 Units Subcutaneous Once  . pantoprazole (PROTONIX) IV  40 mg Intravenous Q12H  . sodium bicarbonate  50 mEq Intravenous Once   PRN Medications: diphenhydrAMINE, diphenhydrAMINE, naloxone, ondansetron (ZOFRAN) IV, ondansetron (ZOFRAN) IV, ondansetron, oxyCODONE-acetaminophen, sodium chloride   Allergies: Allergies as of 12/25/2011  . (No Known Allergies)    Family and Social History: Family History  Problem  Relation Age of Onset  . Diabetes Father   . Heart disease Father   . Colon cancer Neg Hx   . Diabetes Sister   . Heart disease Sister   . Heart disease Mother    History   Social History  . Marital Status: Divorced    Spouse Name: N/A    Number of Children: N/A  . Years of Education: N/A   Occupational History  . Not on file.   Social History Main Topics  . Smoking status: Current Everyday Smoker -- 0.5 packs/day for 30 years    Types: Cigarettes  . Smokeless tobacco: Never Used   Comment: faxing referral to quitline when signed by patient  . Alcohol Use: Yes     LIQUOR OR WINE WEEKLY  . Drug Use: No  . Sexually Active: Not Currently   Other Topics Concern  . Not on file   Social History Narrative   Financial assistance approved for 100% discount at [facility] and has [place]  August 08, 2009 5:48 PM*PATIENT WAS GIVEN DM CARD.Lela Sturdivant NT II  June 08, 2010 3:56 PM    Vitals: Temp:  [97.7 F (36.5 C)-100.6 F (38.1 C)] 98.6 F (37 C) (09/08 1025) Pulse Rate:  [88-115] 90  (09/08 1025) Resp:  [20-30] 27  (09/08 1200) BP: (145-175)/(71-105) 150/71 mmHg (09/08 1025) SpO2:  [45 %-100 %] 100 % (09/08 1200)   Weight: Filed Weights   02/06/12 1834  Weight: 248 lb (112.492 kg)    In and Outs:  Intake/Output Summary (Last 24 hours) at 02/09/12 1303 Last data filed at 02/09/12 1200  Gross per 24 hour  Intake   3647 ml  Output   2900 ml  Net    747 ml    Physical Exam: General appearance: alert, cooperative and no distress Resp: Rales auscultated throughout all lungs fields but worse in lower fields. No dullness to percusion. Normal respiratory effort. Cardio: regular rate and rhythm GI: Diffuse tenderness. No rebound, guarding, or rigidity.  BS normal. Extremities: extremities normal, atraumatic, no cyanosis or edema Incision/Wound:  Abdominal incisions with dressings C/D/I.   Lab results: Basic Metabolic Panel:  Basename 02/09/12 0645  02/08/12 0520  NA 139 139  K 4.2 5.1  CL 108 107  CO2 24 23  GLUCOSE 148* 167*  BUN 28* 38*  CREATININE 1.51* 2.26*  CALCIUM 8.3* 7.9*  MG -- --  PHOS -- --   CBC:  Basename 02/09/12 0645 02/08/12 0520  WBC 13.8* 18.3*  NEUTROABS -- --  HGB 8.9* 9.6*  HCT 25.2* 27.8*  MCV 70.8* 71.6*  PLT 181 193   CBG:  Basename 02/09/12 1159 02/09/12 0825 02/08/12 2113 02/08/12 1741 02/08/12 1136 02/08/12 0720  GLUCAP 121* 135* 126* 127* 136* 159*   Hemoglobin A1C:  Basename 02/06/12 1546  HGBA1C 5.9*    Imaging results:  Dg Chest 2 View 02/08/2012  IMPRESSION: 1.  Low lung volumes with atelectasis and/or consolidation in the medial aspect of the left lower lobe. 2.  Trace bilateral pleural effusions.  Dg Abd 2 Views 02/08/2012 IMPRESSION: 1.  Nonspecific bowel gas pattern, as above, concerning for potential small bowel obstruction. This may alternatively represent a regional small bowel ileus. 2.  No pneumoperitoneum.   Assessment & Plan by Problem: 1.   Acute kidney injury:  This is now resolving, most likely prerenal from hypovolemia.  Agree with IV hydration as patient recovers from abdominal operation and observing creatinine.  Continue holding ACE-inhibitors in the setting of AKI. Pulmonary exam concerning for pulmonary edema; likely fluid overload secondary to kidney insufficiency, but urinary output is now increasing and kidney function returning. Patient is saturating well and is without respiratory distress. - Continue IV hydration for now - Hold ACE inhibitors - Trend creatinine  2.   Hyperkalemia:  Now resolved.  It does not appear the patient received the interventions previously recommended, but potassium has now normalized. Likely secondary to AKI. ACE inhibitor can exacerbate hyperkalemia in AKI. - Continue holding ACE inhibitor - Monitor potassium daily  3.   Diabetes mellitus:  Good control with moderate NovoLog correction, 4U of NovoLog meal coverage, and 5U of  Lantus qHS.  CBGs are remaining in the 100s.  Fasting sugar this morning was 148. - Continue current insulin regimen  4.   Hypertension:  At home patient takes valsartan 40mg  daily.  Here it was substituted with irbesartan but this is being held right now.  Systolic blood pressures are ranging from the 140s to 150s which can be tolerated over the short term. - Continue holding ACE inhibitors, no need to treat hypertension in the acute setting   Signed by:  Shann Medal. Juleen China, MD PGY-I, Internal Medicine  02/09/2012, 12:43 PM

## 2012-02-09 NOTE — Progress Notes (Signed)
3 Days Post-Op  Subjective: Still with significant abdominal pain, but improved from yesterday.Passing gas and had a small bowel movement. No nausea.says his breathing is better than yesterday.  Objective: Vital signs in last 24 hours: Temp:  [97.7 F (36.5 C)-100.6 F (38.1 C)] 98.7 F (37.1 C) (09/08 0531) Pulse Rate:  [88-115] 88  (09/08 0531) Resp:  [20-30] 30  (09/08 0800) BP: (145-175)/(71-105) 145/76 mmHg (09/08 0531) SpO2:  [45 %-100 %] 98 % (09/08 0800)   Intake/Output from previous day: 09/07 0701 - 09/08 0700 In: 3647 [P.O.:790; I.V.:2857] Out: 2700 [Urine:2700] Intake/Output this shift:     General appearance: alert and mild distress Resp: mild decreased breath sounds bilaterally Cardio: regular rate and rhythm, S1, S2 normal, no murmur, click, rub or gallop GI: soft, minimally tender at the incision. Bowel sounds present. Dressings dry.  Incision: dressings basically dry.  Lab Results:   Basename 02/09/12 0645 02/08/12 0520  WBC 13.8* 18.3*  HGB 8.9* 9.6*  HCT 25.2* 27.8*  PLT 181 193   BMET  Basename 02/09/12 0645 02/08/12 0520  NA 139 139  K 4.2 5.1  CL 108 107  CO2 24 23  GLUCOSE 148* 167*  BUN 28* 38*  CREATININE 1.51* 2.26*  CALCIUM 8.3* 7.9*   PT/INR No results found for this basename: LABPROT:2,INR:2 in the last 72 hours ABG No results found for this basename: PHART:2,PCO2:2,PO2:2,HCO3:2 in the last 72 hours  MEDS, Scheduled    . alvimopan  12 mg Oral BID  . atorvastatin  20 mg Oral QHS  . dextrose  1 ampule Intravenous Once  . enoxaparin (LOVENOX) injection  40 mg Subcutaneous Q24H  . HYDROmorphone PCA 0.3 mg/mL   Intravenous Q4H  . insulin aspart  0-15 Units Subcutaneous TID WC  . insulin aspart  0-5 Units Subcutaneous QHS  . insulin aspart  4 Units Subcutaneous TID WC  . insulin glargine  5 Units Subcutaneous QHS  . insulin regular  10 Units Subcutaneous Once  . pantoprazole (PROTONIX) IV  40 mg Intravenous Q12H  . sodium  bicarbonate  50 mEq Intravenous Once    Studies/Results: Dg Chest 2 View  02/08/2012  *RADIOLOGY REPORT*  Clinical Data: Shortness of breath, congestion, fever and dyspnea. Status post colectomy.  CHEST - 2 VIEW  Comparison: Chest x-ray 01/30/2012.  Findings: Lung volumes are low.  The patient is in a very lordotic position.  With these limitations in mind, there appears to be an opacity in the medial aspect of the left lower lobe with some air bronchograms. Trace bilateral pleural effusions.  Mild crowding of the pulmonary vasculature, accentuated by patient's low lung volumes, without frank pulmonary edema.  Heart size appears borderline enlarged, likely accentuated by patient positioning. Mediastinal contours appear widened, also likely related to low lung volumes and lordotic positioning.  IMPRESSION: 1.  Low lung volumes with atelectasis and/or consolidation in the medial aspect of the left lower lobe. 2.  Trace bilateral pleural effusions.   Original Report Authenticated By: Etheleen Mayhew, M.D.    Dg Abd 2 Views  02/08/2012  *RADIOLOGY REPORT*  Clinical Data: Status post partial colectomy.  Evaluate for potential postoperative ileus.  ABDOMEN - 2 VIEW  Comparison: No priors.  Findings: There is a paucity of bowel gas.  There appear to be some dilated loops of bowel in the right upper quadrant of the abdomen measuring up to 5.1 cm in diameter.  These are not clearly colonic, and are suspicious for dilated loops of  small bowel.  Some air fluid levels are noted on the left lateral decubitus view.  No gross pneumoperitoneum. A small volume of gas is present within the stomach.  Multiple midline surgical staples and a couple of right- sided surgical staples are also noted.  IMPRESSION: 1.  Nonspecific bowel gas pattern, as above, concerning for potential small bowel obstruction. This may alternatively represent a regional small bowel ileus. 2.  No pneumoperitoneum.  These results were called by telephone  on 02/08/2012 at 01:45 p.m. to Dr. Margot Chimes, who verbally acknowledged these results.   Original Report Authenticated By: Etheleen Mayhew, M.D.     Assessment: s/p Procedure(s): LAPAROSCOPIC PARTIAL COLECTOMY PARTIAL COLECTOMY COLONOSCOPY Patient Active Problem List  Diagnosis  . Type II diabetes mellitus, well controlled  . Hyperlipidemia LDL goal <70  . MICROCYTIC ANEMIA  . TOBACCO ABUSE  . OBSTRUCTIVE SLEEP APNEA  . HYPERTENSION  . H/O colonoscopy with polypectomy  . Cataract, bilateral  . Obesity (BMI 30.0-34.9)  . Health care maintenance  . 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  . Left knee pain  . Hyperkalemia  . ARF (acute renal failure)    Improved from yesterday. Creatinine has decreased. Potassium has normalized.  Plan: no change today. Foley out today - left in yesterday to monitor urine output, but this is  Now improved   LOS: 3 days     Haywood Lasso, MD, [facility] Surgery, [place] [phone]   02/09/2012 10:10 AM

## 2012-02-10 LAB — CBC
HCT: 26.2 % — ABNORMAL LOW (ref 39.0–52.0)
Hemoglobin: 9.3 g/dL — ABNORMAL LOW (ref 13.0–17.0)
MCH: 24.9 pg — ABNORMAL LOW (ref 26.0–34.0)
MCHC: 35.5 g/dL (ref 30.0–36.0)
MCV: 70.2 fL — ABNORMAL LOW (ref 78.0–100.0)
Platelets: 241 10*3/uL (ref 150–400)
RBC: 3.73 MIL/uL — ABNORMAL LOW (ref 4.22–5.81)
RDW: 16.7 % — ABNORMAL HIGH (ref 11.5–15.5)
WBC: 9.3 10*3/uL (ref 4.0–10.5)

## 2012-02-10 LAB — BASIC METABOLIC PANEL
BUN: 23 mg/dL (ref 6–23)
CO2: 24 mEq/L (ref 19–32)
Calcium: 8.7 mg/dL (ref 8.4–10.5)
Chloride: 108 mEq/L (ref 96–112)
Creatinine, Ser: 1.31 mg/dL (ref 0.50–1.35)
GFR calc Af Amer: 67 mL/min — ABNORMAL LOW (ref >90)
GFR calc non Af Amer: 58 mL/min — ABNORMAL LOW (ref >90)
Glucose, Bld: 135 mg/dL — ABNORMAL HIGH (ref 70–99)
Potassium: 4.1 mEq/L (ref 3.5–5.1)
Sodium: 140 mEq/L (ref 135–145)

## 2012-02-10 LAB — GLUCOSE, CAPILLARY
Glucose-Capillary: 135 mg/dL — ABNORMAL HIGH (ref 70–99)
Glucose-Capillary: 143 mg/dL — ABNORMAL HIGH (ref 70–99)

## 2012-02-10 MED ORDER — PANTOPRAZOLE SODIUM 40 MG PO TBEC
40.0000 mg | DELAYED_RELEASE_TABLET | Freq: Two times a day (BID) | ORAL | Status: DC
  Administered 2012-02-10 – 2012-02-13 (×6): 40 mg via ORAL
  Filled 2012-02-10 (×7): qty 1

## 2012-02-10 MED ORDER — ALUM & MAG HYDROXIDE-SIMETH 200-200-20 MG/5ML PO SUSP
30.0000 mL | Freq: Four times a day (QID) | ORAL | Status: DC | PRN
Start: 2012-02-10 — End: 2012-02-13
  Administered 2012-02-10 – 2012-02-13 (×6): 30 mL via ORAL
  Filled 2012-02-10 (×6): qty 30

## 2012-02-10 NOTE — Progress Notes (Signed)
Orthopedic Tech Progress Note Patient Details:  [name] 02-22-1951 [id number]  Patient ID: [name], male   DOB: 07-16-1950, 61 y.o.   MRN: [id number] Viewed order from rn order list  Hildred Priest 02/10/2012, 2:57 PM

## 2012-02-10 NOTE — Progress Notes (Signed)
Subjective:    Interval Events:  Patient is feeling better this morning.  Pain improved.  Has had several BMs now, this AM most recently.  Still some aversion to eating but no vomiting.  Patient denies dyspnea, cough, and chest pain.    Objective:    Vital Signs:   Temp:  [98.2 F (36.8 C)-99.6 F (37.6 C)] 98.6 F (37 C) (09/09 1007) Pulse Rate:  [85-102] 96  (09/09 1007) Resp:  [18-84] 20  (09/09 1007) BP: (150-171)/(79-94) 171/94 mmHg (09/09 1007) SpO2:  [17 %-100 %] 95 % (09/09 1007) Weight:  [268 lb 11.9 oz (121.9 kg)] 268 lb 11.9 oz (121.9 kg) (09/09 0608) Last BM Date: 02/09/12   Weights: 24-hour Weight change:   Filed Weights   02/06/12 1834 02/10/12 0608  Weight: 248 lb (112.492 kg) 268 lb 11.9 oz (121.9 kg)     Intake/Output:   Intake/Output Summary (Last 24 hours) at 02/10/12 1043 Last data filed at 02/10/12 0900  Gross per 24 hour  Intake 2057.5 ml  Output   1450 ml  Net  607.5 ml       Physical Exam: General appearance: alert, cooperative and no distress Resp: clear to auscultation bilaterally Cardio: regular rate and rhythm GI: BS normal. Diffuse tenderness.  Dressings C/D/I. Extremities: extremities normal, atraumatic, no cyanosis or edema    Labs: Basic Metabolic Panel:  Lab [id number] 02/09/12 0645 02/08/12 0520 02/07/12 1813 02/07/12 0624  NA 140 139 139 137 137  K 4.1 4.2 5.1 6.5* 5.4*  CL 108 108 107 105 105  CO2 24 24 23 24 24   GLUCOSE 135* 148* 167* 151* 175*  BUN 23 28* 38* 39* 29*  CREATININE 1.31 1.51* 2.26* 2.88* 2.34*  CALCIUM 8.7 8.3* 7.9* -- --  MG -- -- -- -- --  PHOS -- -- -- -- --   CBC:  Lab 02/10/12 0555 02/09/12 0645 02/08/12 0520 02/07/12 1813 02/07/12 0624  WBC 9.3 13.8* 18.3* 16.3* 14.1*  NEUTROABS -- -- -- -- --  HGB 9.3* 8.9* 9.6* 10.6* 11.2*  HCT 26.2* 25.2* 27.8* 30.3* 32.3*  MCV 70.2* 70.8* 71.6* 72.0* 72.1*  PLT 241 181 193 240 218   CBG:  Lab 02/10/12 0741 02/09/12 2140 02/09/12 1720  02/09/12 1159 02/09/12 0825  GLUCAP 135* 124* 129* 121* 135*     Medications:    Infusions:    . DISCONTD: sodium chloride 50 mL/hr at 02/09/12 1650     Scheduled Medications:    . atorvastatin  20 mg Oral QHS  . dextrose  1 ampule Intravenous Once  . enoxaparin (LOVENOX) injection  40 mg Subcutaneous Q24H  . insulin aspart  0-15 Units Subcutaneous TID WC  . insulin aspart  0-5 Units Subcutaneous QHS  . insulin aspart  4 Units Subcutaneous TID WC  . insulin glargine  5 Units Subcutaneous QHS  . insulin regular  10 Units Subcutaneous Once  . pantoprazole  40 mg Oral BID AC  . sodium bicarbonate  50 mEq Intravenous Once  . DISCONTD: alvimopan  12 mg Oral BID  . DISCONTD: HYDROmorphone PCA 0.3 mg/mL   Intravenous Q4H  . DISCONTD: pantoprazole (PROTONIX) IV  40 mg Intravenous Q12H     PRN Medications: morphine injection, ondansetron (ZOFRAN) IV, ondansetron, oxyCODONE-acetaminophen, DISCONTD: diphenhydrAMINE, DISCONTD: diphenhydrAMINE, DISCONTD: naloxone, DISCONTD: ondansetron (ZOFRAN) IV, DISCONTD: sodium chloride   Assessment/ Plan:   1. Acute kidney injury: This is now resolving, most likely prerenal from hypovolemia. Continue holding ACE-inhibitors in the  setting of AKI; creatinine today (1.3) still above previous baseline (1.1) and is trending down.  Pulmonary exam is much improved today without rhonchi or rales, indicating improvement in volume status.  UOP adequate at 0.5 cc/kg/hr. - IV hydration stopped, encourage patient to drink fluids - Hold ACE inhibitors  - Trend creatinine   2. Hyperkalemia: Now resolved. It does not appear the patient received the interventions previously recommended, but potassium has now normalized. Likely secondary to AKI. ACE inhibitor can exacerbate hyperkalemia in AKI.   3. Diabetes mellitus: Good control with moderate NovoLog correction, 4U of NovoLog meal coverage, and 5U of Lantus qHS. CBGs are remaining in the 100s. Fasting sugar this  morning was 148.  - Continue current insulin regimen   4. Hypertension: At home patient takes valsartan 40mg  daily. Here it was substituted with irbesartan but this is being held right now. Systolic blood pressure was 171 this AM, but I still don't believe this requires treatment in the acute setting.  It will probably be safe to restart ACE inhibitor tomorrow or the next day.  - Continue holding ACE inhibitors, no need to treat hypertension in the acute setting    Length of Stay: 4 days   Signed by:  Shann Medal. Juleen China, MD PGY-I, Internal Medicine Pager [phone]  02/10/2012, 10:43 AM

## 2012-02-10 NOTE — Consult Note (Signed)
Internal Medicine Teaching Service Attending Dr.Zelda Reames I have examined the patient at bedside today and discussed the management of the Patient with the resident team. In brief [name] is POD 5 post resection of his colon consulted for AKI. Initially seen by the hospital ist team and later transferred to [place] since he is a clinic patient. Patient has no complaints today. Probable ATN vs Pre renal AKI. His kidney functions are improving and trending towards baseline. His potassium is in the normal range.Would continue PO and monitor his I/O to avoid fluid overload. Continue spirometry and ambulation. Thank you for the consult.

## 2012-02-10 NOTE — Progress Notes (Signed)
4 Days Post-Op  Subjective: More comfortable; using PO pain meds Three bowel movements yesterday. Good UOP  Objective: Vital signs in last 24 hours: Temp:  [98.2 F (36.8 C)-99.6 F (37.6 C)] 98.7 F (37.1 C) (09/09 0500) Pulse Rate:  [85-102] 96  (09/09 0500) Resp:  [18-84] 20  (09/09 0500) BP: (150-165)/(71-84) 150/82 mmHg (09/09 0500) SpO2:  [17 %-100 %] 98 % (09/09 0500) Weight:  [268 lb 11.9 oz (121.9 kg)] 268 lb 11.9 oz (121.9 kg) (09/09 0608) Last BM Date: 02/09/12  Intake/Output from previous day: 09/08 0701 - 09/09 0700 In: 1937.5 [P.O.:600; I.V.:1337.5] Out: 1450 [Urine:1450] Intake/Output this shift:    General appearance: alert, cooperative and no distress Resp: clear to auscultation bilaterally GI: obese; + bowel sounds Incision - upper end with minimal drainage. otherwise c/d/i  Lab Results:   Basename 02/10/12 0555 02/09/12 0645  WBC 9.3 13.8*  HGB 9.3* 8.9*  HCT 26.2* 25.2*  PLT 241 181   BMET  Basename 02/10/12 0555 02/09/12 0645  NA 140 139  K 4.1 4.2  CL 108 108  CO2 24 24  GLUCOSE 135* 148*  BUN 23 28*  CREATININE 1.31 1.51*  CALCIUM 8.7 8.3*   PT/INR No results found for this basename: LABPROT:2,INR:2 in the last 72 hours ABG No results found for this basename: PHART:2,PCO2:2,PO2:2,HCO3:2 in the last 72 hours  Studies/Results: Dg Chest 2 View  02/08/2012  *RADIOLOGY REPORT*  Clinical Data: Shortness of breath, congestion, fever and dyspnea. Status post colectomy.  CHEST - 2 VIEW  Comparison: Chest x-ray 01/30/2012.  Findings: Lung volumes are low.  The patient is in a very lordotic position.  With these limitations in mind, there appears to be an opacity in the medial aspect of the left lower lobe with some air bronchograms. Trace bilateral pleural effusions.  Mild crowding of the pulmonary vasculature, accentuated by patient's low lung volumes, without frank pulmonary edema.  Heart size appears borderline enlarged, likely accentuated by  patient positioning. Mediastinal contours appear widened, also likely related to low lung volumes and lordotic positioning.  IMPRESSION: 1.  Low lung volumes with atelectasis and/or consolidation in the medial aspect of the left lower lobe. 2.  Trace bilateral pleural effusions.   Original Report Authenticated By: Etheleen Mayhew, M.D.    Dg Abd 2 Views  02/08/2012  *RADIOLOGY REPORT*  Clinical Data: Status post partial colectomy.  Evaluate for potential postoperative ileus.  ABDOMEN - 2 VIEW  Comparison: No priors.  Findings: There is a paucity of bowel gas.  There appear to be some dilated loops of bowel in the right upper quadrant of the abdomen measuring up to 5.1 cm in diameter.  These are not clearly colonic, and are suspicious for dilated loops of small bowel.  Some air fluid levels are noted on the left lateral decubitus view.  No gross pneumoperitoneum. A small volume of gas is present within the stomach.  Multiple midline surgical staples and a couple of right- sided surgical staples are also noted.  IMPRESSION: 1.  Nonspecific bowel gas pattern, as above, concerning for potential small bowel obstruction. This may alternatively represent a regional small bowel ileus. 2.  No pneumoperitoneum.  These results were called by telephone on 02/08/2012 at 01:45 p.m. to Dr. Margot Chimes, who verbally acknowledged these results.   Original Report Authenticated By: Etheleen Mayhew, M.D.     Anti-infectives: Anti-infectives     Start     Dose/Rate Route Frequency Ordered Stop   02/06/12 1515  ertapenem (INVANZ) 1 g in sodium chloride 0.9 % 50 mL IVPB        1 g 100 mL/hr over 30 Minutes Intravenous Every 24 hours 02/06/12 1514 02/06/12 1704   02/05/12 1437   ertapenem (INVANZ) 1 g in sodium chloride 0.9 % 50 mL IVPB        1 g 100 mL/hr over 30 Minutes Intravenous 60 min pre-op 02/05/12 1437 02/06/12 0731          Assessment/Plan: s/p Procedure(s) (LRB) with comments: LAPAROSCOPIC PARTIAL  COLECTOMY (Left) - attempted laparoscopic assisted left hemi-colectomy PARTIAL COLECTOMY (N/A) - right partial colectomy COLONOSCOPY () Acute renal dysfunction secondary to volume depletion/ hyperkalemia - seems to have resolved Acute blood loss anemia - partially secondary to surgery, partially dilutional due to aggressive hydration; Hgb improving; no transfusion indicated, but this may contribute to his unsteadiness with ambulation Post-op ileus - resolved  Will saline lock, advance diet Physical therapy for mobilization.   LOS: 4 days    Rodney Wildermuth K. 02/10/2012

## 2012-02-10 NOTE — Progress Notes (Signed)
Orthopedic Tech Progress Note Patient Details:  [name] 1950/07/16 [id number]  Ortho Devices Type of Ortho Device: Abdominal binder Ortho Device/Splint Location: abdomen Ortho Device/Splint Interventions: Loanne Drilling, Infant Doane 02/10/2012, 2:55 PM

## 2012-02-10 NOTE — Progress Notes (Signed)
Orthopedic Tech Progress Note Patient Details:  [name] 1950/12/12 [id number]  Patient ID: [name], male   DOB: 07/25/1950, 61 y.o.   MRN: [id number] Abdominal binder was left in room;rn will apply     Hildred Priest 02/10/2012, 2:55 PM

## 2012-02-10 NOTE — Progress Notes (Signed)
Had a BM, feeling better now, after Mylanta.  Will D/c films. Go back to full liquids.

## 2012-02-11 ENCOUNTER — Inpatient Hospital Stay (HOSPITAL_COMMUNITY): Payer: Medicaid Other

## 2012-02-11 LAB — GLUCOSE, CAPILLARY
Glucose-Capillary: 118 mg/dL — ABNORMAL HIGH (ref 70–99)
Glucose-Capillary: 110 mg/dL — ABNORMAL HIGH (ref 70–99)
Glucose-Capillary: 135 mg/dL — ABNORMAL HIGH (ref 70–99)
Glucose-Capillary: 123 mg/dL — ABNORMAL HIGH (ref 70–99)
Glucose-Capillary: 107 mg/dL — ABNORMAL HIGH (ref 70–99)
Glucose-Capillary: 96 mg/dL (ref 70–99)

## 2012-02-11 LAB — SODIUM, URINE, RANDOM: Sodium, Ur: 30 mEq/L

## 2012-02-11 LAB — BASIC METABOLIC PANEL
BUN: 27 mg/dL — ABNORMAL HIGH (ref 6–23)
CO2: 22 mEq/L (ref 19–32)
Calcium: 8.9 mg/dL (ref 8.4–10.5)
Chloride: 110 mEq/L (ref 96–112)
Creatinine, Ser: 1.46 mg/dL — ABNORMAL HIGH (ref 0.50–1.35)
GFR calc Af Amer: 59 mL/min — ABNORMAL LOW (ref >90)
GFR calc non Af Amer: 51 mL/min — ABNORMAL LOW (ref >90)
Glucose, Bld: 143 mg/dL — ABNORMAL HIGH (ref 70–99)
Potassium: 4.1 mEq/L (ref 3.5–5.1)
Sodium: 143 mEq/L (ref 135–145)

## 2012-02-11 LAB — CREATININE, URINE, RANDOM: Creatinine, Urine: 198.04 mg/dL

## 2012-02-11 IMAGING — CR DG ABDOMEN ACUTE W/ 1V CHEST
5 series · 5 of 5 positions shown · non-contrast
Comparison: [DATE]

CLINICAL DATA: Right-sided abdominal pain.  Partial colectomy
[DATE].

ACUTE ABDOMEN SERIES (ABDOMEN 2 VIEW & CHEST 1 VIEW)

[w chest pa]
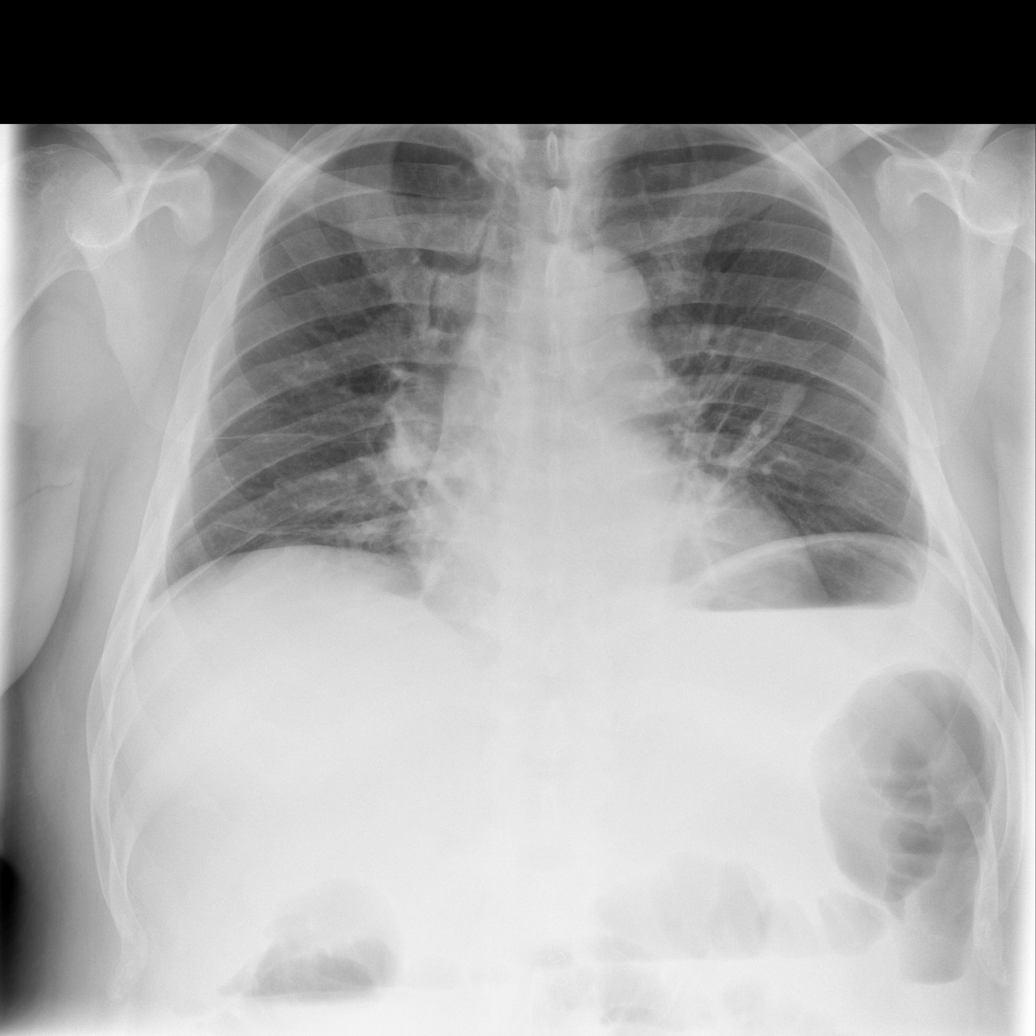

[w abdomen upright (1 of 2)]
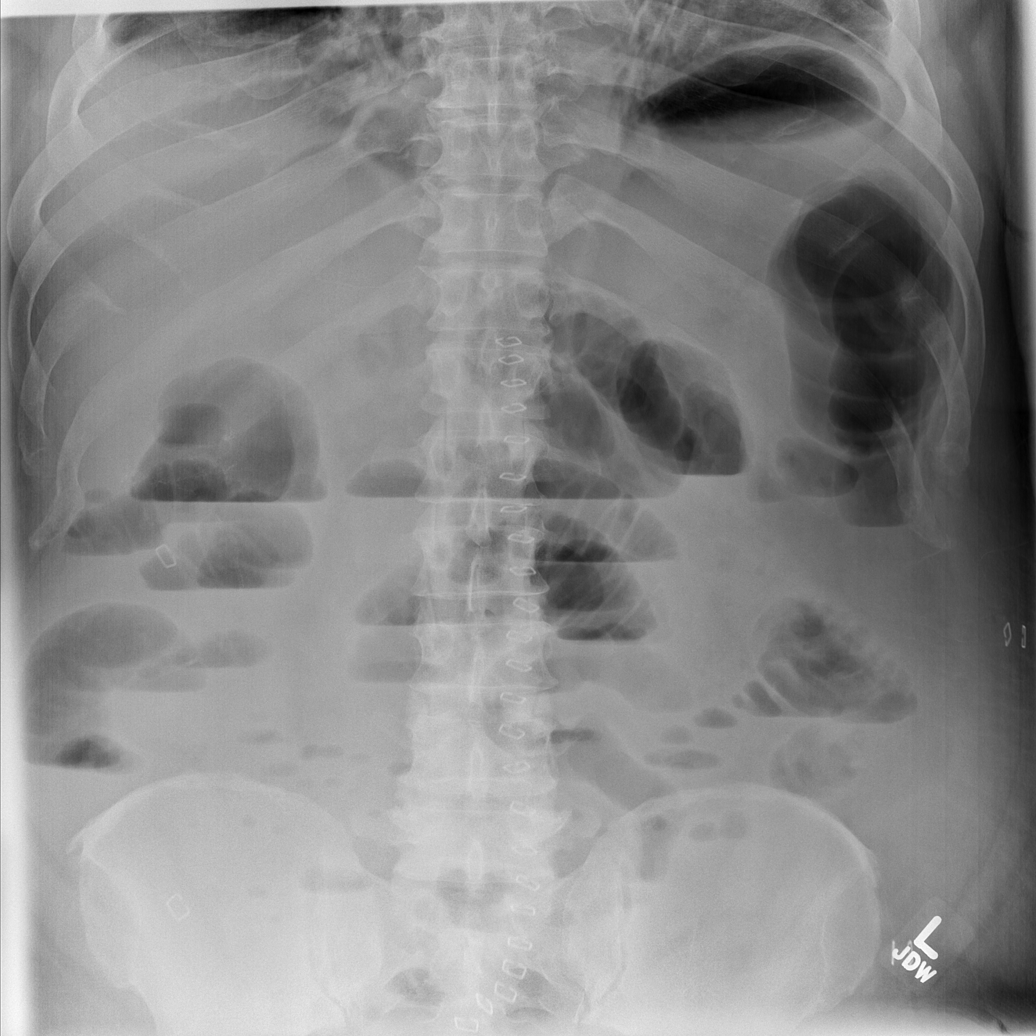

[w abdomen upright (2 of 2)]
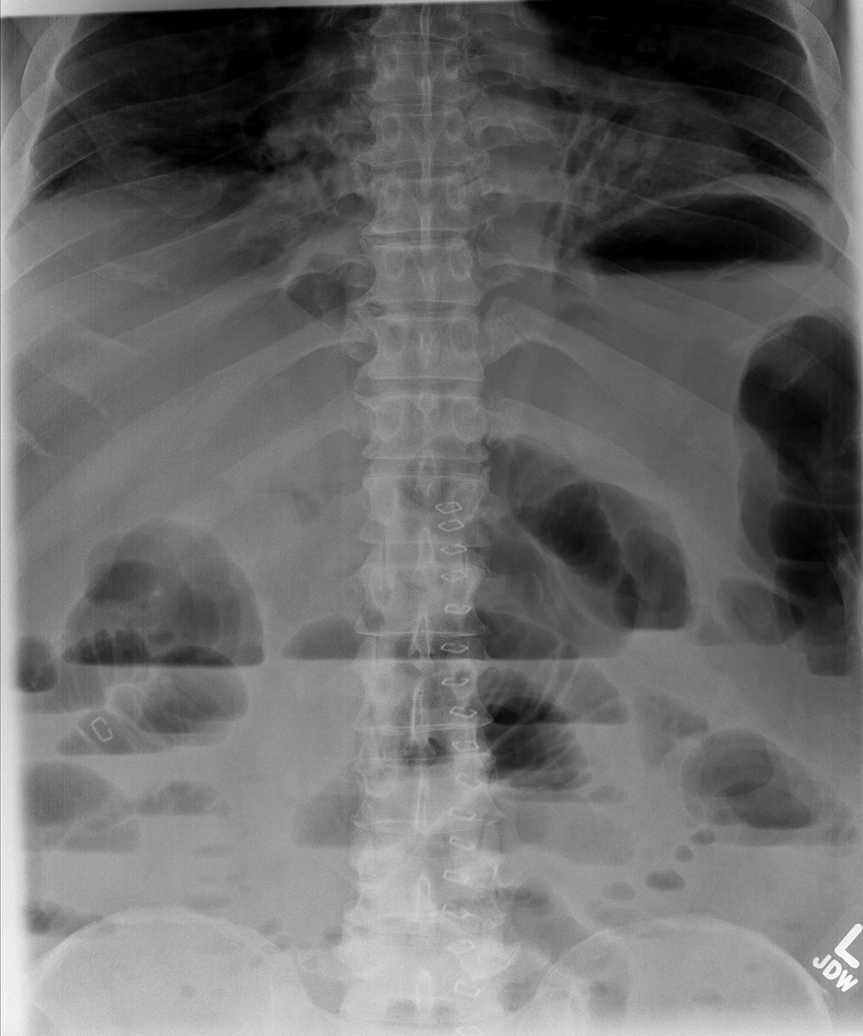

[t abdomen supine (1 of 2)]
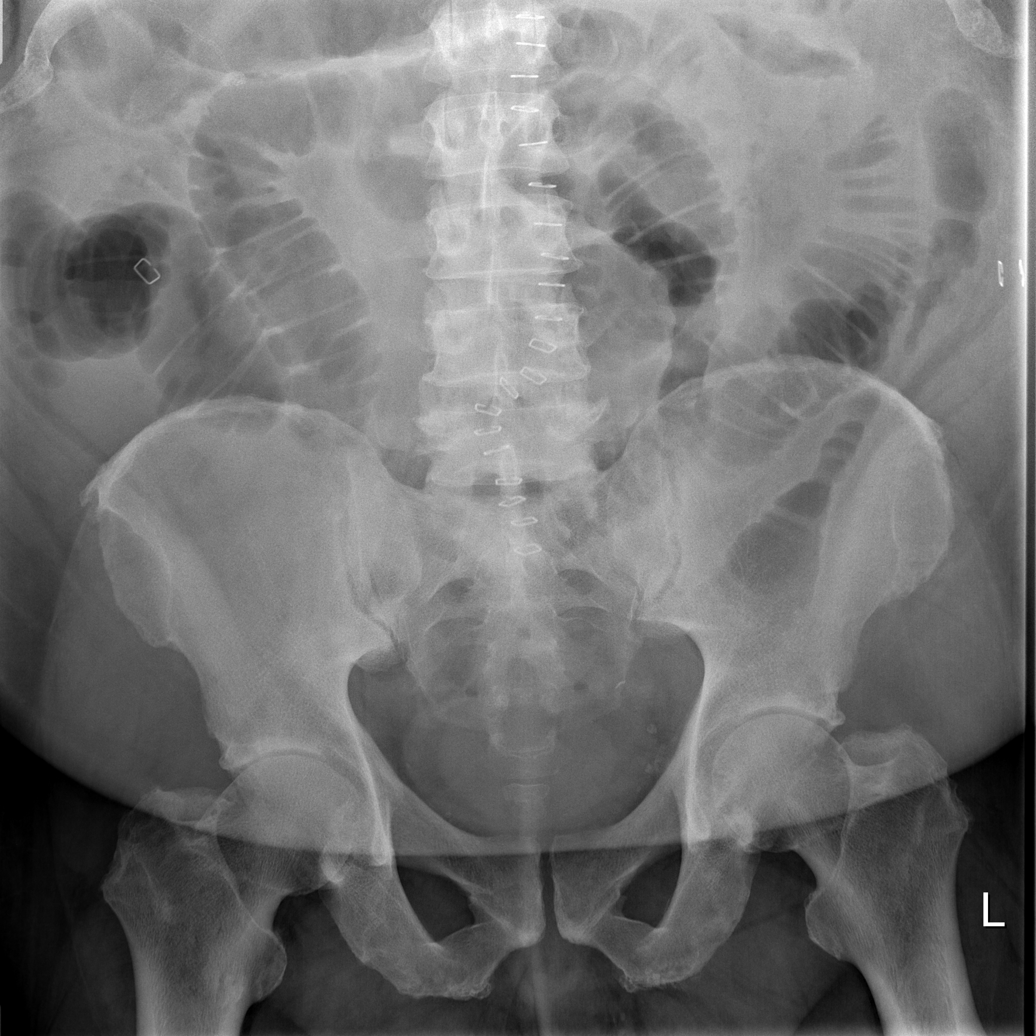

[t abdomen supine (2 of 2)]
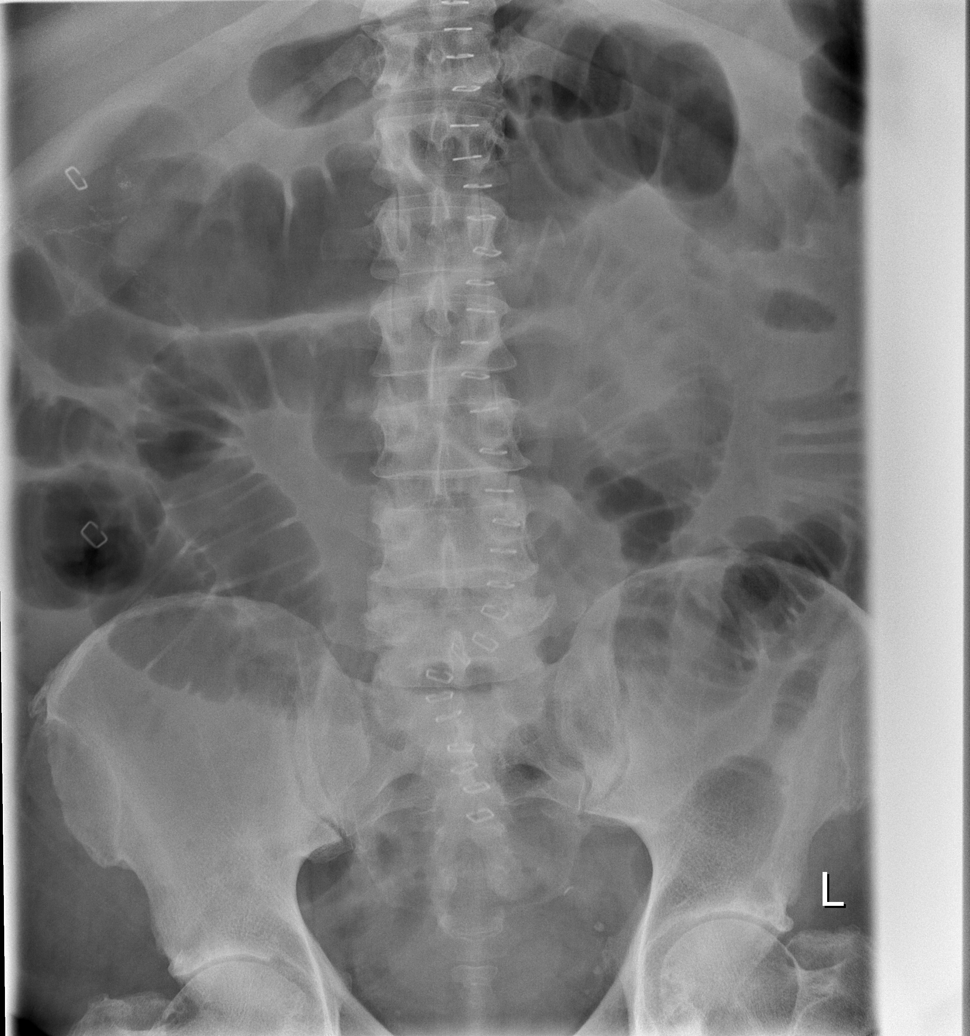

[5 of 5 positions shown; findings below may reference images not displayed]

FINDINGS: Lung volumes are low with curvilinear bilateral lower
lobe atelectasis.  No free air beneath the diaphragms.  No pleural
effusion.  The lungs are otherwise clear.  Heart size normal.

Mid abdominal clips are present.  Diffuse mild dilatation of
multiple loops small bowel with multiple differential air-fluid
levels.  Overall, the extent of gaseous bowel dilatation is
increased.  Colon is not dilated but does contain gas.
IMPRESSION: Diffuse small bowel dilatation with gas filled colon which could
indicate postoperative ileus, although a distal small bowel
obstruction could have a similar appearance but would be less
likely given the clinical context.

No free air.

Bilateral lower lobe atelectasis.

## 2012-02-11 MED ORDER — SODIUM CHLORIDE 0.9 % IV SOLN
INTRAVENOUS | Status: DC
  Administered 2012-02-11: 1000 mL via INTRAVENOUS
  Administered 2012-02-12: 01:00:00 via INTRAVENOUS

## 2012-02-11 MED ORDER — CARVEDILOL 3.125 MG PO TABS: 3.1250 mg | ORAL_TABLET | Freq: Two times a day (BID) | ORAL | Status: DC

## 2012-02-11 MED ORDER — CARVEDILOL 3.125 MG PO TABS
3.1250 mg | ORAL_TABLET | Freq: Two times a day (BID) | ORAL | Status: DC
  Administered 2012-02-11 – 2012-02-12 (×3): 3.125 mg via ORAL
  Filled 2012-02-11 (×6): qty 1

## 2012-02-11 NOTE — Progress Notes (Signed)
MD made aware of patient status regarding increasing abdominal pain and vomiting episode X 1. New order noted.

## 2012-02-11 NOTE — Progress Notes (Signed)
5 Days Post-Op  Subjective: Feels much better after large BM yesterday.  Tolerating full liquids. It does not appear that the patient was seen by physical therapy yesterday.  Still not mobilizing very well. Voiding well.  Objective: Vital signs in last 24 hours: Temp:  [97.5 F (36.4 C)-99.7 F (37.6 C)] 97.5 F (36.4 C) (09/10 0558) Pulse Rate:  [88-106] 106  (09/10 0558) Resp:  [18-22] 20  (09/10 0558) BP: (158-176)/(79-94) 173/84 mmHg (09/10 0558) SpO2:  [95 %-100 %] 99 % (09/10 0558) Weight:  [260 lb 5.8 oz (118.1 kg)] 260 lb 5.8 oz (118.1 kg) (09/10 0558) Last BM Date: 02/10/12  Intake/Output from previous day: 09/09 0701 - 09/10 0700 In: 120 [P.O.:120] Out: -  Intake/Output this shift:    General appearance: alert, cooperative and no distress Resp: clear to auscultation bilaterally Cardio: regular rate and rhythm, S1, S2 normal, no murmur, click, rub or gallop GI: incisional tenderness; + bowel sounds Port sites c/d/i; midline - slightly pink at bottom end of incision; will monitor  Lab Results:   [facility] 02/10/12 0555 02/09/12 0645  WBC 9.3 13.8*  HGB 9.3* 8.9*  HCT 26.2* 25.2*  PLT 241 181   BMET  Basename 02/11/12 0605 02/10/12 0555  NA 143 140  K 4.1 4.1  CL 110 108  CO2 22 24  GLUCOSE 143* 135*  BUN 27* 23  CREATININE 1.46* 1.31  CALCIUM 8.9 8.7   PT/INR No results found for this basename: LABPROT:2,INR:2 in the last 72 hours ABG No results found for this basename: PHART:2,PCO2:2,PO2:2,HCO3:2 in the last 72 hours  Studies/Results: No results found.  Anti-infectives: Anti-infectives     Start     Dose/Rate Route Frequency Ordered Stop   02/06/12 1515   ertapenem (INVANZ) 1 g in sodium chloride 0.9 % 50 mL IVPB        1 g 100 mL/hr over 30 Minutes Intravenous Every 24 hours 02/06/12 1514 02/06/12 1704   02/05/12 1437   ertapenem (INVANZ) 1 g in sodium chloride 0.9 % 50 mL IVPB        1 g 100 mL/hr over 30 Minutes Intravenous 60 min  pre-op 02/05/12 1437 02/06/12 0731          Assessment/Plan: s/p Procedure(s) (LRB) with comments: LAPAROSCOPIC PARTIAL COLECTOMY (Left) - attempted laparoscopic assisted left hemi-colectomy PARTIAL COLECTOMY (N/A) - right partial colectomy COLONOSCOPY () Hopefully, PT will see the patient today to help get him mobilized and determine if he has any home needs.  Probably ready for discharge tomorrow. Watch incision Discussed path report with the patient - T3N0 invasive adenocarcinoma.  Will refer patient to Oncology as outpatient.  LOS: 5 days    Indra Wolters K. 02/11/2012

## 2012-02-11 NOTE — Progress Notes (Signed)
Subjective:    Interval Events:  [name] reports having a "rough" night last night.  He had an episode of rather intense abdominal pain.  He has been seen by Dr. [name] and no intervention is deemed necessary.  Pt does report "black" stools since the operation.  He has a history OSA and uses CPAP inconsistently at home.  He denies respiratory distress but admits it is hard to take a deep breath.  He denies headaches and chest pain.  Discussed with patient the importance of using CPAP consistently at home.  Also encouraged patient to drink lots of water today.    Objective:    Vital Signs:   Temp:  [97.5 F (36.4 C)-99.7 F (37.6 C)] 97.5 F (36.4 C) (09/10 0558) Pulse Rate:  [88-106] 106  (09/10 0558) Resp:  [18-22] 20  (09/10 0558) BP: (158-176)/(79-94) 173/84 mmHg (09/10 0558) SpO2:  [95 %-100 %] 99 % (09/10 0558) Weight:  [260 lb 5.8 oz (118.1 kg)] 260 lb 5.8 oz (118.1 kg) (09/10 0558) Last BM Date: 02/10/12   Weights: 24-hour Weight change: -8 lb 6 oz (-3.8 kg)  Filed Weights   02/06/12 1834 02/10/12 0608 02/11/12 0558  Weight: 248 lb (112.492 kg) 268 lb 11.9 oz (121.9 kg) 260 lb 5.8 oz (118.1 kg)     Intake/Output:   Intake/Output Summary (Last 24 hours) at 02/11/12 0735 Last data filed at 02/10/12 1300  Gross per 24 hour  Intake    120 ml  Output      0 ml  Net    120 ml       Physical Exam: General appearance: alert, cooperative and no distress Resp: clear to auscultation bilaterally Cardio: regular rate and rhythm GI: soft; diffusly tender; no guarding, rigidity, or rebound. Extremities: extremities normal, atraumatic, no cyanosis or edema Incision/Wound: Dressings C/D/I    Labs: Basic Metabolic Panel:  Lab [id number] 02/10/12 0555 02/09/12 0645 02/08/12 0520 02/07/12 1813  NA 143 140 139 139 137  K 4.1 4.1 4.2 5.1 6.5*  CL 110 108 108 107 105  CO2 22 24 24 23 24   GLUCOSE 143* 135* 148* 167* 151*  BUN 27* 23 28* 38* 39*  CREATININE 1.46*  1.31 1.51* 2.26* 2.88*  CALCIUM 8.9 8.7 8.3* -- --  MG -- -- -- -- --  PHOS -- -- -- -- --   CBG:  Lab 02/10/12 1155 02/10/12 0741 02/09/12 2140 02/09/12 1720 02/09/12 1159  GLUCAP 143* 135* 124* 129* 121*     Medications:    Infusions:    . DISCONTD: sodium chloride 50 mL/hr at 02/09/12 1650     Scheduled Medications:    . atorvastatin  20 mg Oral QHS  . dextrose  1 ampule Intravenous Once  . enoxaparin (LOVENOX) injection  40 mg Subcutaneous Q24H  . insulin aspart  0-15 Units Subcutaneous TID WC  . insulin aspart  0-5 Units Subcutaneous QHS  . insulin aspart  4 Units Subcutaneous TID WC  . insulin glargine  5 Units Subcutaneous QHS  . insulin regular  10 Units Subcutaneous Once  . pantoprazole  40 mg Oral BID AC  . sodium bicarbonate  50 mEq Intravenous Once  . DISCONTD: alvimopan  12 mg Oral BID  . DISCONTD: pantoprazole (PROTONIX) IV  40 mg Intravenous Q12H     PRN Medications: alum & mag hydroxide-simeth, morphine injection, ondansetron (ZOFRAN) IV, ondansetron, oxyCODONE-acetaminophen   Assessment/ Plan:    1. Acute kidney injury: After improving over  the past few days, his creatinine increased this morning to 1.46 from 1.31.  At the highest, his creatinine was 2.88.  The most likely explanation is prerenal from hypovolemia, but ATN is a possibility. To better understand this, we will calculate a FENa.  If his in and out records are to be trusted, he drank only 120cc yesterday; 5 urine voids were recorded without volumes.  I encouraged the patient to drink lots of water today, and we should continue holding the ACE-inhibitor.  We restarted IV hydration at 75cc per hour in the meantime. - NS @ 75cc/hr - Hold ACE inhibitors  - Trend creatinine - Urine sodium and creatinine today  2. Hyperkalemia: Now resolved. It does not appear the patient received the interventions previously recommended, but potassium has now normalized. Likely secondary to AKI. ACE inhibitor  can exacerbate hyperkalemia in AKI.   3. Diabetes mellitus: Good control with moderate NovoLog correction, 4U of NovoLog meal coverage, and 5U of Lantus qHS. CBGs are remaining in the 100s. Fasting sugar this morning was 148.  - Continue current insulin regimen   4. Hypertension: At home patient takes valsartan 40mg  daily. Here it was substituted with irbesartan but this is being held right now.  Pressures have been elevated, which is partially a response to pain, but with the increase in creatinine tomorrow, we suspect ACEi will have to be held until hospital follow up with the clinic.  During this time, we will treat with a beta blocker. - Continue holding ACE inhibitors - Start carvedilol 3.125mg  BID  5. OSA:  Encouraged patient to use CPAP consistently at home.  If he is to stay here for much longer, we should ask RT to provided CPAP here, but if he is to go home tomorrow, that may not be worthwhile.   Length of Stay: 5 days   Signed by:  Shann Medal. Juleen China, MD PGY-I, Internal Medicine Pager [phone]  02/11/2012, 7:35 AM

## 2012-02-11 NOTE — Evaluation (Addendum)
Physical Therapy Evaluation Patient Details Name: [name] MRN: [id number] DOB: [birth year] Today's Date: 02/11/2012 Time: [id number] PT Time Calculation (min): 32 min  PT Assessment / Plan / Recommendation Clinical Impression  61 yo s/p partial colectomy with mobility limited by pain and decr safety judgment/impulsivity. Will benefit from PT to incr safe use of DME and for home safety evaluation.    PT Assessment  Patient needs continued PT services    Follow Up Recommendations  Home health PT;Supervision for mobility/OOB    Barriers to Discharge None      Equipment Recommendations  Rolling walker with 5" wheels    Recommendations for Other Services OT consult   Frequency Min 3X/week    Precautions / Restrictions Precautions Precautions: Fall Required Braces or Orthoses: Other Brace/Splint Other Brace/Splint: abd binder   Pertinent Vitals/Pain 5/10 abd pain with activity       Mobility  Bed Mobility Bed Mobility: Rolling Left;Left Sidelying to Sit Rolling Left: 5: Supervision Left Sidelying to Sit: 5: Supervision;HOB flat Details for Bed Mobility Assistance: cues for technique as pt with tendency to try to pull from supine to sit; pt with incr effort/difficulty performing without assistance due to abd pain; discussed possiblility of sleeping in his recliner with easier entry/exit than bed Transfers Transfers: Sit to Stand;Stand to Sit Sit to Stand: 5: Supervision;With upper extremity assist;From bed;From toilet Stand to Sit: 5: Supervision;With upper extremity assist;To toilet;To chair/3-in-1;With armrests Details for Transfer Assistance: Pt very impulsive as coming out of bed and required increased cues for safe use of RW; pt urgently then needing to use bathroom (likely reason for impulsivity) and still required cues when transferring off toilet, however less impulsive Ambulation/Gait Ambulation/Gait Assistance: 5: Supervision Ambulation Distance (Feet): 140  Feet Assistive device: Rolling walker Ambulation/Gait Assistance Details: vc for safe, proper use of RW (keeping it closer to body, standing erect, not "parking" RW off to the side when into bathroom) Gait Pattern: Step-through pattern;Trunk flexed Stairs: Yes Stairs Assistance: 4: Min guard Stair Management Technique: One rail Left;Step to pattern;Sideways Number of Stairs: 3     Exercises     PT Diagnosis: Difficulty walking;Acute pain  PT Problem List: Decreased mobility;Decreased cognition;Decreased knowledge of use of DME;Decreased safety awareness;Pain PT Treatment Interventions: DME instruction;Gait training;Stair training;Functional mobility training;Cognitive remediation;Patient/family education   PT Goals Acute Rehab PT Goals PT Goal Formulation: With patient Time For Goal Achievement: 02/13/12 Potential to Achieve Goals: Good Pt will Roll Supine to Left Side: with modified independence PT Goal: Rolling Supine to Left Side - Progress: Goal set today Pt will go Supine/Side to Sit: with modified independence;with HOB 0 degrees PT Goal: Supine/Side to Sit - Progress: Goal set today Pt will go Sit to Supine/Side: with modified independence;with HOB 0 degrees PT Goal: Sit to Supine/Side - Progress: Goal set today Pt will go Sit to Stand: with modified independence;with upper extremity assist PT Goal: Sit to Stand - Progress: Goal set today Pt will go Stand to Sit: with modified independence;with upper extremity assist PT Goal: Stand to Sit - Progress: Goal set today Pt will Ambulate: 51 - 150 feet;with modified independence;with least restrictive assistive device PT Goal: Ambulate - Progress: Goal set today Additional Goals Additional Goal #1: Pt will verbalize when to wear abd binder PT Goal: Additional Goal #1 - Progress: Goal set today  Visit Information  Last PT Received On: 02/11/12 Assistance Needed: +1    Subjective Data  Subjective: Reports his daughters are not  working and can  be with him 24/7 Patient Stated Goal: Be as Independent as possible   Prior Functioning  Home Living Lives With: Alone Available Help at Discharge: Family;Available 24 hours/day Type of Home: Apartment Home Access: Stairs to enter Entrance Stairs-Number of Steps: 3 Entrance Stairs-Rails: Right;Left;Can reach both Home Layout: One level Bathroom Shower/Tub: Product/process development scientist: Handicapped height Bathroom Accessibility: Yes How Accessible: Accessible via walker Home Adaptive Equipment: None Prior Function Level of Independence: Independent Able to Take Stairs?: Reciprically Driving: Yes Vocation: Part time employment Pharmacologist) Comments: doesn't have a car; uses the bus Communication Communication: No difficulties Dominant Hand: Right    Cognition  Overall Cognitive Status: Impaired Area of Impairment: Safety/judgement;Problem solving Arousal/Alertness: Awake/alert Orientation Level: Disoriented to;Time (thought it was March) Behavior During Session: Anxious Safety/Judgement: Decreased safety judgement for tasks assessed;Impulsive Problem Solving: difficulty figuring out sequence for getting OOB even with cues    Extremity/Trunk Assessment Right Upper Extremity Assessment RUE ROM/Strength/Tone: [facility] for tasks assessed Left Upper Extremity Assessment LUE ROM/Strength/Tone: WFL for tasks assessed Right Lower Extremity Assessment RLE ROM/Strength/Tone: [facility] for tasks assessed Left Lower Extremity Assessment LLE ROM/Strength/Tone: [facility] for tasks assessed Trunk Assessment Trunk Assessment: Other exceptions (flexed due to abd pain)   Balance    End of Session PT - End of Session Activity Tolerance: Patient tolerated treatment well Patient left: in chair;with call bell/phone within reach Nurse Communication: Mobility status;Other (comment) (RN asked to be notified at end of session to re-hook IV)  GP     Milanna Kozlov 02/11/2012,  9:39 AM  Pager [phone]

## 2012-02-11 NOTE — Progress Notes (Signed)
Imogene Burn. [name], MD, [facility]  02/11/2012 7:00 AM

## 2012-02-12 LAB — GLUCOSE, CAPILLARY
Glucose-Capillary: 130 mg/dL — ABNORMAL HIGH (ref 70–99)
Glucose-Capillary: 148 mg/dL — ABNORMAL HIGH (ref 70–99)
Glucose-Capillary: 104 mg/dL — ABNORMAL HIGH (ref 70–99)
Glucose-Capillary: 136 mg/dL — ABNORMAL HIGH (ref 70–99)

## 2012-02-12 LAB — BASIC METABOLIC PANEL
BUN: 24 mg/dL — ABNORMAL HIGH (ref 6–23)
CO2: 22 mEq/L (ref 19–32)
Calcium: 8.2 mg/dL — ABNORMAL LOW (ref 8.4–10.5)
Chloride: 108 mEq/L (ref 96–112)
Creatinine, Ser: 1.39 mg/dL — ABNORMAL HIGH (ref 0.50–1.35)
GFR calc Af Amer: 62 mL/min — ABNORMAL LOW (ref >90)
GFR calc non Af Amer: 54 mL/min — ABNORMAL LOW (ref >90)
Glucose, Bld: 129 mg/dL — ABNORMAL HIGH (ref 70–99)
Potassium: 4 mEq/L (ref 3.5–5.1)
Sodium: 139 mEq/L (ref 135–145)

## 2012-02-12 LAB — CBC
HCT: 24.8 % — ABNORMAL LOW (ref 39.0–52.0)
Hemoglobin: 8.5 g/dL — ABNORMAL LOW (ref 13.0–17.0)
MCH: 23.8 pg — ABNORMAL LOW (ref 26.0–34.0)
MCHC: 34.3 g/dL (ref 30.0–36.0)
MCV: 69.5 fL — ABNORMAL LOW (ref 78.0–100.0)
Platelets: 272 10*3/uL (ref 150–400)
RBC: 3.57 MIL/uL — ABNORMAL LOW (ref 4.22–5.81)
RDW: 16.6 % — ABNORMAL HIGH (ref 11.5–15.5)
WBC: 7.5 10*3/uL (ref 4.0–10.5)

## 2012-02-12 MED ORDER — BISACODYL 10 MG RE SUPP
10.0000 mg | Freq: Once | RECTAL | Status: AC
  Administered 2012-02-12: 10 mg via RECTAL
  Filled 2012-02-12: qty 1

## 2012-02-12 MED ORDER — METOCLOPRAMIDE HCL 5 MG/ML IJ SOLN
10.0000 mg | Freq: Four times a day (QID) | INTRAMUSCULAR | Status: DC
  Administered 2012-02-12 – 2012-02-13 (×4): 10 mg via INTRAVENOUS
  Filled 2012-02-12 (×10): qty 2

## 2012-02-12 MED ORDER — CARVEDILOL 6.25 MG PO TABS
6.2500 mg | ORAL_TABLET | Freq: Two times a day (BID) | ORAL | Status: DC
  Administered 2012-02-12 – 2012-02-13 (×2): 6.25 mg via ORAL
  Filled 2012-02-12 (×4): qty 1

## 2012-02-12 NOTE — Progress Notes (Signed)
Physical Therapy Treatment Patient Details Name: [name] MRN: [id number] DOB: [birth year] Today's Date: 02/12/2012 Time: [id number] PT Time Calculation (min): 15 min  PT Assessment / Plan / Recommendation Comments on Treatment Session  Pt is progressing well with pain better controlled.  Continue to work on posture with ambulation as well as increasing distance as pt becomes fatigued and gets nervous about going farther and increasing pain.  Continue to recommend f/u with HHPT.    Follow Up Recommendations  Home health PT;Supervision for mobility/OOB    Barriers to Discharge        Equipment Recommendations  Rolling walker with 5" wheels    Recommendations for Other Services    Frequency Min 3X/week   Plan Discharge plan remains appropriate;Frequency remains appropriate    Precautions / Restrictions Precautions Precautions: Fall Required Braces or Orthoses: Other Brace/Splint Other Brace/Splint: abd binder Restrictions Weight Bearing Restrictions: No   Pertinent Vitals/Pain 3/10 "manageable" abdominal pain    Mobility  Bed Mobility Bed Mobility: Not assessed Transfers Transfers: Sit to Stand;Stand to Sit Sit to Stand: 6: Modified independent (Device/Increase time);From chair/3-in-1;With upper extremity assist Stand to Sit: 6: Modified independent (Device/Increase time);To chair/3-in-1;To bed;With upper extremity assist Details for Transfer Assistance: Pt not showing impulsivity today, safe transfers noted Ambulation/Gait Ambulation/Gait Assistance: 5: Supervision Ambulation Distance (Feet): 150 Feet Assistive device: Rolling walker Ambulation/Gait Assistance Details: pt kept RW with him throughout time in bathroom and when ambulaing, vc's for erect posture and step length Gait Pattern: Step-through pattern;Trunk flexed;Decreased stride length Stairs: No Wheelchair Mobility Wheelchair Mobility: No    Exercises     PT Diagnosis:    PT Problem List:   PT  Treatment Interventions:     PT Goals Acute Rehab PT Goals PT Goal Formulation: With patient Time For Goal Achievement: 02/13/12 Potential to Achieve Goals: Good Pt will Roll Supine to Left Side: with modified independence Pt will go Supine/Side to Sit: with modified independence;with HOB 0 degrees Pt will go Sit to Supine/Side: with modified independence;with HOB 0 degrees Pt will go Sit to Stand: with modified independence;with upper extremity assist PT Goal: Sit to Stand - Progress: Met Pt will go Stand to Sit: with modified independence;with upper extremity assist PT Goal: Stand to Sit - Progress: Met Pt will Ambulate: 51 - 150 feet;with modified independence;with least restrictive assistive device PT Goal: Ambulate - Progress: Progressing toward goal Additional Goals Additional Goal #1: Pt will verbalize when to wear abd binder PT Goal: Additional Goal #1 - Progress: Met  Visit Information  Last PT Received On: 02/12/12 Assistance Needed: +1    Subjective Data  Subjective: I feel so much better than this morning, thanks for coming back Patient Stated Goal: Be as Independent as possible   Cognition  Overall Cognitive Status: Appears within functional limits for tasks assessed/performed Arousal/Alertness: Awake/alert Orientation Level: Appears intact for tasks assessed Behavior During Session: [facility] for tasks performed    Balance  Balance Balance Assessed: Yes Dynamic Standing Balance Dynamic Standing - Balance Support: No upper extremity supported;During functional activity Dynamic Standing - Level of Assistance: 6: Modified independent (Device/Increase time)  End of Session PT - End of Session Equipment Utilized During Treatment: Gait belt Activity Tolerance: Patient tolerated treatment well Patient left: in chair;with call bell/phone within reach Nurse Communication: Mobility status   GP   [name], [place]  [phone]   [name] 02/12/2012, 1:39 PM

## 2012-02-12 NOTE — Progress Notes (Signed)
Subjective:    Interval Events:  [name] reports feeling much better.  He denies dyspnea and is able to take deeper breathes today.  He denies chest pain.  Abdomen is still tender but improving.  Encouraged patient to drink lots of water today.    Objective:    Vital Signs:   Temp:  [99.5 F (37.5 C)-101.1 F (38.4 C)] 99.5 F (37.5 C) (09/11 0821) Pulse Rate:  [82-116] 82  (09/11 0821) Resp:  [18-22] 18  (09/11 0821) BP: (137-167)/(74-83) 167/83 mmHg (09/11 0821) SpO2:  [97 %-100 %] 97 % (09/11 0821) Weight:  [270 lb 11.6 oz (122.8 kg)] 270 lb 11.6 oz (122.8 kg) (09/11 0529) Last BM Date: 02/11/12   Weights: 24-hour Weight change: 10 lb 5.8 oz (4.7 kg)  Filed Weights   02/10/12 0608 02/11/12 0558 02/12/12 0529  Weight: 268 lb 11.9 oz (121.9 kg) 260 lb 5.8 oz (118.1 kg) 270 lb 11.6 oz (122.8 kg)     Intake/Output:   Intake/Output Summary (Last 24 hours) at 02/12/12 1014 Last data filed at 02/11/12 2200  Gross per 24 hour  Intake 1326.6 ml  Output    351 ml  Net  975.6 ml       Physical Exam: General appearance: alert, cooperative and no distress Resp: clear to auscultation bilaterally Cardio: regular rate and rhythm GI: Diffuse tenderness; no guarding, rebound, or rigidity Extremities: extremities normal, atraumatic, no cyanosis or edema    Labs: Basic Metabolic Panel:  Lab [id number] 0520 02/11/12 0605 02/10/12 0555 02/09/12 0645 02/08/12 0520  NA 139 143 140 139 139  K 4.0 4.1 4.1 4.2 5.1  CL 108 110 108 108 107  CO2 22 22 24 24 23   GLUCOSE 129* 143* 135* 148* 167*  BUN 24* 27* 23 28* 38*  CREATININE 1.39* 1.46* 1.31 1.51* 2.26*  CALCIUM 8.2* 8.9 8.7 -- --  MG -- -- -- -- --  PHOS -- -- -- -- --   CBC:  Lab 02/12/12 0520 02/10/12 0555 02/09/12 0645 02/08/12 0520 02/07/12 1813  WBC 7.5 9.3 13.8* 18.3* 16.3*  NEUTROABS -- -- -- -- --  HGB 8.5* 9.3* 8.9* 9.6* 10.6*  HCT 24.8* 26.2* 25.2* 27.8* 30.3*  MCV 69.5* 70.2* 70.8* 71.6* 72.0*  PLT  272 241 181 193 240   CBG:  Lab 02/12/12 0758 02/11/12 2144 02/11/12 1727 02/11/12 1205 02/11/12 0804  GLUCAP 130* 96 107* 123* 135*   Imaging: Dg Abd Acute W/chest 02/11/2012 IMPRESSION: Diffuse small bowel dilatation with gas filled colon which could indicate postoperative ileus, although a distal small bowel obstruction could have a similar appearance but would be less likely given the clinical context.  No free air.  Bilateral lower lobe atelectasis.    Medications:    Infusions:    . sodium chloride 75 mL/hr at 02/12/12 0038     Scheduled Medications:    . atorvastatin  20 mg Oral QHS  . bisacodyl  10 mg Rectal Once  . carvedilol  3.125 mg Oral BID WC  . dextrose  1 ampule Intravenous Once  . enoxaparin (LOVENOX) injection  40 mg Subcutaneous Q24H  . insulin aspart  0-15 Units Subcutaneous TID WC  . insulin aspart  0-5 Units Subcutaneous QHS  . insulin aspart  4 Units Subcutaneous TID WC  . insulin glargine  5 Units Subcutaneous QHS  . insulin regular  10 Units Subcutaneous Once  . metoCLOPramide (REGLAN) injection  10 mg Intravenous Q6H  . pantoprazole  40 mg Oral BID AC  . sodium bicarbonate  50 mEq Intravenous Once     PRN Medications: alum & mag hydroxide-simeth, morphine injection, ondansetron (ZOFRAN) IV, ondansetron, oxyCODONE-acetaminophen   Assessment/ Plan:    RECOMMENDATIONS:  Increase IV fluids to 15mL/hr. We have ordered strict I&O's. If oral intake increases and UOP is adequate, we will consider stopping IV fluids tomorrow.  We increased carvedilol to 6.25mg  BID.   1. Acute kidney injury: Creatinine improved to 1.39 today after having a small set back yesterday.  FENa was 0.15% indicating prerenal etiology with hypovolemia the most likely etiology.  I again encouraged the patient to drink lots of water today, and we should continue holding the ACE-inhibitor. While is oral intake is reduced, he needs to be on IV hydration.  Yesterday, his total fluid  intake was 1838mL or  28mL/hr (40% oral, 60% IV).  His maintenance fluid requirement is about 11mL/hr. 38mL/hr represents only 33% of his daily requirement. Unless his oral intake dramatically improves today, he will not be meeting his daily fluid requirements.  We have recommended an increase back to 46mL/hr. - Recommend increase in IVF rate - Hold ACE inhibitors  - Trend creatinine   2. Hyperkalemia: Now resolved. It does not appear the patient received the interventions previously recommended, but potassium has now normalized. Likely secondary to AKI. ACE inhibitor can exacerbate hyperkalemia in AKI.   3. Diabetes mellitus: Good control with moderate NovoLog correction, 4U of NovoLog meal coverage, and 5U of Lantus qHS. CBGs are remaining in the 100s. Fasting sugar this morning was 130.  - Continue current insulin regimen   4. Hypertension: At home patient takes valsartan 40mg  daily. Here it was substituted with irbesartan but this is being held right now. Pressures have been elevated, which is partially a response to pain, but with the increase in creatinine tomorrow, we suspect ACEi will have to be held until hospital follow up with the clinic. During this time, we will treat with a beta blocker.  Blood pressures are improved now that he is on carvedilol, but he is still hypertensive with systolic pressures in the 160s. We will increase carvedilol to 6.25mg  BID. - Continue holding ACE inhibitors  - Increase carvedilol to 6.25mg  BID   5. OSA: Encouraged patient to use CPAP consistently at home. If he is to stay here for much longer, we should ask RT to provided CPAP here, but if he is to go home tomorrow, that may not be worthwhile.   Length of Stay: 6 days   Signed by:  Shann Medal. Juleen China, MD PGY-I, Internal Medicine Pager [phone]  02/12/2012, 10:14 AM

## 2012-02-12 NOTE — Progress Notes (Signed)
Internal Medicine Teaching Service Attending Dr.Ladiamond Gallina I have examined the patient at bedside today and discussed the management of the Patient with the resident team.Agree with Dr.Wallaces documentation. Its extremely important to keep an accurate Input output while treating Kidney dysfunction.

## 2012-02-12 NOTE — Progress Notes (Signed)
6 Days Post-Op  Subjective: Patient reportedly had one episode of vomiting yesterday (not recorded in I/O's) Also had at least one bowel movement yesterday. AAS - dilated SB and air in colon (ileus)   Objective: Vital signs in last 24 hours: Temp:  [98.2 F (36.8 C)-101.1 F (38.4 C)] 100.3 F (37.9 C) (09/11 0529) Pulse Rate:  [83-116] 83  (09/11 0529) Resp:  [18-22] 18  (09/11 0529) BP: (134-167)/(72-83) 137/74 mmHg (09/11 0529) SpO2:  [97 %-100 %] 97 % (09/11 0529) Weight:  [270 lb 11.6 oz (122.8 kg)] 270 lb 11.6 oz (122.8 kg) (09/11 0529) Last BM Date: 02/11/12  Intake/Output from previous day: 09/10 0701 - 09/11 0700 In: 1806.6 [P.O.:720; I.V.:1086.6] Out: 351 [Urine:350; Stool:1] Intake/Output this shift: Total I/O In: 795 [I.V.:795] Out: -   General appearance: alert, cooperative and no distress Resp: clear to auscultation bilaterally GI: mildly distended; +BS; incisional tenderness Midline incision intact - tiny bit of serous drainage at upper end of incision; less pink at bottom of incision  Lab Results:  Today's labs pending  [facility] 02/10/12 0555  WBC 9.3  HGB 9.3*  HCT 26.2*  PLT 241   BMET  Basename 02/11/12 0605 02/10/12 0555  NA 143 140  K 4.1 4.1  CL 110 108  CO2 22 24  GLUCOSE 143* 135*  BUN 27* 23  CREATININE 1.46* 1.31  CALCIUM 8.9 8.7   PT/INR No results found for this basename: LABPROT:2,INR:2 in the last 72 hours ABG No results found for this basename: PHART:2,PCO2:2,PO2:2,HCO3:2 in the last 72 hours  Studies/Results: Dg Abd Acute W/chest  02/11/2012  *RADIOLOGY REPORT*  Clinical Data: Right-sided abdominal pain.  Partial colectomy 02/06/2012.  ACUTE ABDOMEN SERIES (ABDOMEN 2 VIEW & CHEST 1 VIEW)  Comparison: 02/08/2012  Findings: Lung volumes are low with curvilinear bilateral lower lobe atelectasis.  No free air beneath the diaphragms.  No pleural effusion.  The lungs are otherwise clear.  Heart size normal.  Mid abdominal clips  are present.  Diffuse mild dilatation of multiple loops small bowel with multiple differential air-fluid levels.  Overall, the extent of gaseous bowel dilatation is increased.  Colon is not dilated but does contain gas.  IMPRESSION: Diffuse small bowel dilatation with gas filled colon which could indicate postoperative ileus, although a distal small bowel obstruction could have a similar appearance but would be less likely given the clinical context.  No free air.  Bilateral lower lobe atelectasis.   Original Report Authenticated By: Arline Asp, M.D.     Anti-infectives: Anti-infectives     Start     Dose/Rate Route Frequency Ordered Stop   02/06/12 1515   ertapenem (INVANZ) 1 g in sodium chloride 0.9 % 50 mL IVPB        1 g 100 mL/hr over 30 Minutes Intravenous Every 24 hours 02/06/12 1514 02/06/12 1704   02/05/12 1437   ertapenem (INVANZ) 1 g in sodium chloride 0.9 % 50 mL IVPB        1 g 100 mL/hr over 30 Minutes Intravenous 60 min pre-op 02/05/12 1437 02/06/12 0731          Assessment/Plan: s/p Procedure(s) (LRB) with comments: LAPAROSCOPIC PARTIAL COLECTOMY (Left) - attempted laparoscopic assisted left hemi-colectomy PARTIAL COLECTOMY (N/A) - right partial colectomy COLONOSCOPY () Wait for today's labs Dulcolax/ Reglan Ambulate Probable discharge tomorrow with home health PT  LOS: 6 days    Rodney Mcgrory K. 02/12/2012

## 2012-02-13 DIAGNOSIS — E875 Hyperkalemia: Secondary | ICD-10-CM

## 2012-02-13 DIAGNOSIS — N179 Acute kidney failure, unspecified: Secondary | ICD-10-CM

## 2012-02-13 DIAGNOSIS — G4733 Obstructive sleep apnea (adult) (pediatric): Secondary | ICD-10-CM

## 2012-02-13 DIAGNOSIS — E119 Type 2 diabetes mellitus without complications: Secondary | ICD-10-CM

## 2012-02-13 DIAGNOSIS — I10 Essential (primary) hypertension: Secondary | ICD-10-CM

## 2012-02-13 LAB — BASIC METABOLIC PANEL
BUN: 24 mg/dL — ABNORMAL HIGH (ref 6–23)
CO2: 22 mEq/L (ref 19–32)
Calcium: 8.8 mg/dL (ref 8.4–10.5)
Chloride: 106 mEq/L (ref 96–112)
Creatinine, Ser: 1.19 mg/dL (ref 0.50–1.35)
GFR calc Af Amer: 75 mL/min — ABNORMAL LOW (ref >90)
GFR calc non Af Amer: 65 mL/min — ABNORMAL LOW (ref >90)
Glucose, Bld: 161 mg/dL — ABNORMAL HIGH (ref 70–99)
Potassium: 4 mEq/L (ref 3.5–5.1)
Sodium: 138 mEq/L (ref 135–145)

## 2012-02-13 LAB — GLUCOSE, CAPILLARY
Glucose-Capillary: 161 mg/dL — ABNORMAL HIGH (ref 70–99)
Glucose-Capillary: 65 mg/dL — ABNORMAL LOW (ref 70–99)
Glucose-Capillary: 74 mg/dL (ref 70–99)

## 2012-02-13 MED ORDER — OXYCODONE-ACETAMINOPHEN 5-325 MG PO TABS: 1.0000 | ORAL_TABLET | Freq: Four times a day (QID) | ORAL | Status: AC | PRN

## 2012-02-13 MED ORDER — CARVEDILOL 6.25 MG PO TABS: 6.2500 mg | ORAL_TABLET | Freq: Two times a day (BID) | ORAL | Status: DC

## 2012-02-13 NOTE — Progress Notes (Addendum)
Pt continues to experience persistent n/v, administered prn med as order (see mar).  Emesis clear.

## 2012-02-13 NOTE — Progress Notes (Signed)
Subjective:    Interval Events:  [name] had an episode of "choking" just minutes before I saw him this morning.  He was sitting in his chair when he had a feeling of something trying to "come up but couldn't."  He denied feeling any chest pain or chest discomfort during this event and denied dyspnea during this event as well.  He also denies feeling nauseated.  Otherwise, [name] is feeling better and feels ready to go home.    Objective:    Vital Signs:   Temp:  [98.8 F (37.1 C)-99.5 F (37.5 C)] 98.8 F (37.1 C) (09/12 0513) Pulse Rate:  [82-103] 103  (09/12 0800) Resp:  [18] 18  (09/12 0800) BP: (145-167)/(75-87) 152/79 mmHg (09/12 0800) SpO2:  [96 %-97 %] 96 % (09/12 0800) Weight:  [265 lb 6.9 oz (120.4 kg)] 265 lb 6.9 oz (120.4 kg) (09/12 0500) Last BM Date: 02/12/12   Weights: 24-hour Weight change: -5 lb 4.7 oz (-2.4 kg)  Filed Weights   02/11/12 0558 02/12/12 0529 02/13/12 0500  Weight: 260 lb 5.8 oz (118.1 kg) 270 lb 11.6 oz (122.8 kg) 265 lb 6.9 oz (120.4 kg)    Intake/Output:   Intake/Output Summary (Last 24 hours) at 02/13/12 0744 Last data filed at 02/12/12 1300  Gross per 24 hour  Intake    480 ml  Output      No data  Net    480 ml      Physical Exam: General appearance: alert, cooperative and no distress Resp: Small area of bronchial breath sounds over the RML but no associated egophony; tympany normal over all lung fields; no rales or wheezing; normal chest excursion Cardio: regular rate and rhythm    Labs: Basic Metabolic Panel:  Lab [id number] 0520 02/12/12 0520 02/11/12 0605 02/10/12 0555 02/09/12 0645  NA 138 139 143 140 139  K 4.0 4.0 4.1 4.1 4.2  CL 106 108 110 108 108  CO2 22 22 22 24 24   GLUCOSE 161* 129* 143* 135* 148*  BUN 24* 24* 27* 23 28*  CREATININE 1.19 1.39* 1.46* 1.31 1.51*  CALCIUM 8.8 8.2* 8.9 -- --  MG -- -- -- -- --  PHOS -- -- -- -- --   CBG:  Lab 02/12/12 2146 02/12/12 1631 02/12/12 1148 02/12/12 0758  02/11/12 2144  GLUCAP 136* 104* 148* 130* 96     Medications:    Infusions:    . sodium chloride 50 mL/hr at 02/12/12 0038     Scheduled Medications:    . atorvastatin  20 mg Oral QHS  . bisacodyl  10 mg Rectal Once  . carvedilol  6.25 mg Oral BID WC  . dextrose  1 ampule Intravenous Once  . enoxaparin (LOVENOX) injection  40 mg Subcutaneous Q24H  . insulin aspart  0-15 Units Subcutaneous TID WC  . insulin aspart  0-5 Units Subcutaneous QHS  . insulin aspart  4 Units Subcutaneous TID WC  . insulin glargine  5 Units Subcutaneous QHS  . insulin regular  10 Units Subcutaneous Once  . metoCLOPramide (REGLAN) injection  10 mg Intravenous Q6H  . pantoprazole  40 mg Oral BID AC  . sodium bicarbonate  50 mEq Intravenous Once  . DISCONTD: carvedilol  3.125 mg Oral BID WC     PRN Medications: alum & mag hydroxide-simeth, morphine injection, ondansetron (ZOFRAN) IV, ondansetron, oxyCODONE-acetaminophen   Assessment/ Plan:    DISCHARGE RECOMMENDATIONS:  Discharge him on carvedilol 6.25mg  BID until he follows  up with IM clinic. We will schedule him a follow-up visit with [place].  No change to other PTA medications.  1.   Acute kidney injury:  Creatinine improved to 1.19 today, which is within his baseline range. FENa was 0.15% indicating prerenal etiology with hypovolemia the most likely etiology. We should continue holding his ACEi for at least 1 week now that AKI has resolved. - Hold ACE inhibitors   2.   Hyperkalemia:  Now resolved. It does not appear the patient received the interventions previously recommended, but potassium has now normalized. Likely secondary to AKI. ACE inhibitor can exacerbate hyperkalemia in AKI.   3.   Diabetes mellitus:  Good control with moderate NovoLog correction, 4U of NovoLog meal coverage, and 5U of Lantus qHS. CBGs are remaining in the 100s. Fasting sugar this morning was 130.  - Continue current insulin regimen and discharge home on previous home  regimen  4.   Hypertension : At home patient takes valsartan 40mg  daily. Here it was substituted with irbesartan but this is being held right now. Pressures have been elevated, which is partially a response to pain, but with the increase in creatinine tomorrow, we suspect ACEi will have to be held until hospital follow up with the clinic. During this time, we will treat with a beta blocker. Blood pressures are improved now that he is on carvedilol. - Continue holding ACE inhibitors  - Continue carvedilol 6.25mg  BID  5.  OSA:  Encouraged patient to use CPAP consistently at home.   Length of Stay: 7 days   Signed by:  Shann Medal. Juleen China, MD PGY-I, Internal Medicine Pager [phone]  02/13/2012, 7:44 AM

## 2012-02-13 NOTE — Discharge Summary (Signed)
Physician Discharge Summary  Patient ID: [name] MRN: [id number] DOB/AGE: [birth year] 61 y.o.  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  Acute renal failure post-op due to hypovolemia.  Post-op ileus slowly resolved.  Renal failure/ hyperkalemia returned to normal.  Consults: GI Internal medicine  Significant Diagnostic Studies: Path - T3N0 invasive adenocarcinoma  Treatments: surgery: see above.  Discharge Exam: Blood pressure 152/79, pulse 103, temperature 98.8 F (37.1 C), temperature source Oral, resp. rate 18, height 5\' 11"  (1.803 m), weight 265 lb 6.9 oz (120.4 kg), SpO2 96.00%. General appearance: alert, cooperative and no distress GI: soft, non-tender; bowel sounds normal; no masses,  no organomegaly Incision c/d/i  Disposition:   Discharge Orders    Future Appointments: Provider: Department: Dept Phone: Center:   02/19/2012 2:40 PM Imogene Burn. [name], MD [facility] [phone] None   02/27/2012 1:30 PM Ansel Bong, MD [facility] [phone] [facility]       Medication List     As of 02/13/2012 12:05 PM    STOP taking these medications         valsartan 80 MG tablet   Commonly known as: DIOVAN      TAKE these medications         atorvastatin 20 MG tablet   Commonly known as: LIPITOR   Take 20 mg by mouth at bedtime.      carvedilol 6.25 MG tablet   Commonly known as: COREG   Take 1 tablet (6.25 mg total) by mouth 2 (two) times daily with a meal.      insulin NPH-insulin regular (70-30) 100 UNIT/ML injection   Commonly known as: NOVOLIN 70/30   Inject 10-30 Units  into the skin 2 (two) times daily with a meal. Inject 30 units in the morning and 10 units at bedtime.      metFORMIN 500 MG tablet   Commonly known as: GLUCOPHAGE   Take 1,000 mg by mouth 2 (two) times daily with a meal.      oxyCODONE-acetaminophen 5-325 MG per tablet   Commonly known as: PERCOCET/ROXICET   Take 1-2 tablets by mouth every 6 (six) hours as needed.           Follow-up Information    Follow up with HO,MICHELE, MD. On 02/27/2012. (INTERNAL MEDICINE - Your appointment is at 1:30)    Contact information:   [place] [address] [phone]       Follow up with Maia Petties., MD. On 02/19/2012. (2:30 pm for staple removal)    Contact information:   [place] [address] [phone]          Signed: Maia Petties. 02/13/2012, 12:05 PM

## 2012-02-13 NOTE — Progress Notes (Signed)
0800 Pt complaint of choking spells on his throat . Evaluated pt denied sob breathing easy and regular . Sitting at bedside chair . No apparent distress . Able to speak  with full sentence . Vs  Done and charted. Seen and evluated by Dr. Juleen China . To give Maalox po as ordered . No further orders given. Continue monitoring done

## 2012-02-13 NOTE — Progress Notes (Signed)
1350 discharge instructions and prescriptions given to pt and daughter Both verbalized understanding. Wheeled to lobby by Psychologist, occupational

## 2012-02-16 ENCOUNTER — Emergency Department (HOSPITAL_COMMUNITY): Payer: Medicaid Other

## 2012-02-16 ENCOUNTER — Inpatient Hospital Stay (HOSPITAL_COMMUNITY): Payer: Medicaid Other

## 2012-02-16 ENCOUNTER — Encounter (HOSPITAL_COMMUNITY): Payer: Self-pay

## 2012-02-16 ENCOUNTER — Inpatient Hospital Stay (HOSPITAL_COMMUNITY)
Admission: EM | Admit: 2012-02-16 | Discharge: 2012-03-10 | DRG: 907 | Disposition: A | Discharge status: Skilled Nursing Facility | Payer: Medicaid Other | Attending: Surgery | Admitting: Surgery

## 2012-02-16 ENCOUNTER — Encounter (HOSPITAL_COMMUNITY): Payer: Self-pay | Admitting: Anesthesiology

## 2012-02-16 ENCOUNTER — Inpatient Hospital Stay (HOSPITAL_COMMUNITY): Payer: Medicaid Other | Admitting: Anesthesiology

## 2012-02-16 ENCOUNTER — Encounter (HOSPITAL_COMMUNITY)
Admission: EM | Disposition: A | Discharge status: Skilled Nursing Facility | Payer: Self-pay | Source: Home / Self Care | Attending: Surgery

## 2012-02-16 DIAGNOSIS — N390 Urinary tract infection, site not specified: Secondary | ICD-10-CM

## 2012-02-16 DIAGNOSIS — J95821 Acute postprocedural respiratory failure: Secondary | ICD-10-CM | POA: Diagnosis not present

## 2012-02-16 DIAGNOSIS — S31109A Unspecified open wound of abdominal wall, unspecified quadrant without penetration into peritoneal cavity, initial encounter: Secondary | ICD-10-CM

## 2012-02-16 DIAGNOSIS — Z6828 Body mass index (BMI) 28.0-28.9, adult: Secondary | ICD-10-CM

## 2012-02-16 DIAGNOSIS — K651 Peritoneal abscess: Secondary | ICD-10-CM | POA: Diagnosis present

## 2012-02-16 DIAGNOSIS — E43 Unspecified severe protein-calorie malnutrition: Secondary | ICD-10-CM | POA: Diagnosis not present

## 2012-02-16 DIAGNOSIS — Z833 Family history of diabetes mellitus: Secondary | ICD-10-CM

## 2012-02-16 DIAGNOSIS — R6521 Severe sepsis with septic shock: Secondary | ICD-10-CM | POA: Diagnosis present

## 2012-02-16 DIAGNOSIS — R6520 Severe sepsis without septic shock: Secondary | ICD-10-CM | POA: Diagnosis present

## 2012-02-16 DIAGNOSIS — E872 Acidosis, unspecified: Secondary | ICD-10-CM

## 2012-02-16 DIAGNOSIS — E669 Obesity, unspecified: Secondary | ICD-10-CM | POA: Diagnosis present

## 2012-02-16 DIAGNOSIS — H269 Unspecified cataract: Secondary | ICD-10-CM

## 2012-02-16 DIAGNOSIS — Y92009 Unspecified place in unspecified non-institutional (private) residence as the place of occurrence of the external cause: Secondary | ICD-10-CM

## 2012-02-16 DIAGNOSIS — K668 Other specified disorders of peritoneum: Secondary | ICD-10-CM

## 2012-02-16 DIAGNOSIS — E785 Hyperlipidemia, unspecified: Secondary | ICD-10-CM

## 2012-02-16 DIAGNOSIS — A419 Sepsis, unspecified organism: Secondary | ICD-10-CM | POA: Diagnosis present

## 2012-02-16 DIAGNOSIS — K635 Polyp of colon: Secondary | ICD-10-CM | POA: Diagnosis present

## 2012-02-16 DIAGNOSIS — E66811 Obesity, class 1: Secondary | ICD-10-CM | POA: Diagnosis present

## 2012-02-16 DIAGNOSIS — K659 Peritonitis, unspecified: Secondary | ICD-10-CM

## 2012-02-16 DIAGNOSIS — N179 Acute kidney failure, unspecified: Secondary | ICD-10-CM

## 2012-02-16 DIAGNOSIS — T8579XA Infection and inflammatory reaction due to other internal prosthetic devices, implants and grafts, initial encounter: Principal | ICD-10-CM | POA: Diagnosis present

## 2012-02-16 DIAGNOSIS — Z8249 Family history of ischemic heart disease and other diseases of the circulatory system: Secondary | ICD-10-CM

## 2012-02-16 DIAGNOSIS — F17200 Nicotine dependence, unspecified, uncomplicated: Secondary | ICD-10-CM | POA: Diagnosis present

## 2012-02-16 DIAGNOSIS — Z794 Long term (current) use of insulin: Secondary | ICD-10-CM

## 2012-02-16 DIAGNOSIS — C182 Malignant neoplasm of ascending colon: Secondary | ICD-10-CM | POA: Diagnosis present

## 2012-02-16 DIAGNOSIS — J9601 Acute respiratory failure with hypoxia: Secondary | ICD-10-CM | POA: Diagnosis present

## 2012-02-16 DIAGNOSIS — J9600 Acute respiratory failure, unspecified whether with hypoxia or hypercapnia: Secondary | ICD-10-CM

## 2012-02-16 DIAGNOSIS — E870 Hyperosmolality and hypernatremia: Secondary | ICD-10-CM | POA: Diagnosis not present

## 2012-02-16 DIAGNOSIS — K929 Disease of digestive system, unspecified: Secondary | ICD-10-CM

## 2012-02-16 DIAGNOSIS — G4733 Obstructive sleep apnea (adult) (pediatric): Secondary | ICD-10-CM | POA: Diagnosis present

## 2012-02-16 DIAGNOSIS — E440 Moderate protein-calorie malnutrition: Secondary | ICD-10-CM | POA: Diagnosis present

## 2012-02-16 DIAGNOSIS — I959 Hypotension, unspecified: Secondary | ICD-10-CM | POA: Diagnosis present

## 2012-02-16 DIAGNOSIS — Y832 Surgical operation with anastomosis, bypass or graft as the cause of abnormal reaction of the patient, or of later complication, without mention of misadventure at the time of the procedure: Secondary | ICD-10-CM | POA: Diagnosis present

## 2012-02-16 DIAGNOSIS — E119 Type 2 diabetes mellitus without complications: Secondary | ICD-10-CM

## 2012-02-16 HISTORY — PX: ILEOSTOMY: SHX1783

## 2012-02-16 HISTORY — PX: LAPAROTOMY: SHX154

## 2012-02-16 HISTORY — PX: APPLICATION OF WOUND VAC: SHX5189

## 2012-02-16 LAB — URINE MICROSCOPIC-ADD ON

## 2012-02-16 LAB — POCT I-STAT 3, ART BLOOD GAS (G3+)
Acid-base deficit: 9 mmol/L — ABNORMAL HIGH (ref 0.0–2.0)
Acid-base deficit: 9 mmol/L — ABNORMAL HIGH (ref 0.0–2.0)
Bicarbonate: 17.2 mEq/L — ABNORMAL LOW (ref 20.0–24.0)
Bicarbonate: 16.3 mEq/L — ABNORMAL LOW (ref 20.0–24.0)
O2 Saturation: 92 %
O2 Saturation: 99 %
Patient temperature: 97.5
Patient temperature: 97.5
TCO2: 18 mmol/L (ref 0–100)
TCO2: 17 mmol/L (ref 0–100)
pCO2 arterial: 35.9 mmHg (ref 35.0–45.0)
pCO2 arterial: 31.1 mmHg — ABNORMAL LOW (ref 35.0–45.0)
pH, Arterial: 7.285 — ABNORMAL LOW (ref 7.350–7.450)
pH, Arterial: 7.325 — ABNORMAL LOW (ref 7.350–7.450)
pO2, Arterial: 67 mmHg — ABNORMAL LOW (ref 80.0–100.0)
pO2, Arterial: 123 mmHg — ABNORMAL HIGH (ref 80.0–100.0)

## 2012-02-16 LAB — BASIC METABOLIC PANEL
BUN: 30 mg/dL — ABNORMAL HIGH (ref 6–23)
BUN: 32 mg/dL — ABNORMAL HIGH (ref 6–23)
CO2: 15 mEq/L — ABNORMAL LOW (ref 19–32)
CO2: 17 mEq/L — ABNORMAL LOW (ref 19–32)
Calcium: 8.7 mg/dL (ref 8.4–10.5)
Calcium: 7.2 mg/dL — ABNORMAL LOW (ref 8.4–10.5)
Chloride: 105 mEq/L (ref 96–112)
Chloride: 108 mEq/L (ref 96–112)
Creatinine, Ser: 2.26 mg/dL — ABNORMAL HIGH (ref 0.50–1.35)
Creatinine, Ser: 2.32 mg/dL — ABNORMAL HIGH (ref 0.50–1.35)
GFR calc Af Amer: 35 mL/min — ABNORMAL LOW (ref >90)
GFR calc Af Amer: 33 mL/min — ABNORMAL LOW (ref >90)
GFR calc non Af Amer: 30 mL/min — ABNORMAL LOW (ref >90)
GFR calc non Af Amer: 29 mL/min — ABNORMAL LOW (ref >90)
Glucose, Bld: 127 mg/dL — ABNORMAL HIGH (ref 70–99)
Glucose, Bld: 100 mg/dL — ABNORMAL HIGH (ref 70–99)
Potassium: 4.4 mEq/L (ref 3.5–5.1)
Potassium: 4.7 mEq/L (ref 3.5–5.1)
Sodium: 135 mEq/L (ref 135–145)
Sodium: 138 mEq/L (ref 135–145)

## 2012-02-16 LAB — HEPATIC FUNCTION PANEL
ALT: 25 U/L (ref 0–53)
AST: 32 U/L (ref 0–37)
Albumin: 2.6 g/dL — ABNORMAL LOW (ref 3.5–5.2)
Alkaline Phosphatase: 66 U/L (ref 39–117)
Bilirubin, Direct: 0.5 mg/dL — ABNORMAL HIGH (ref 0.0–0.3)
Indirect Bilirubin: 0.6 mg/dL (ref 0.3–0.9)
Total Bilirubin: 1.1 mg/dL (ref 0.3–1.2)
Total Protein: 6.8 g/dL (ref 6.0–8.3)

## 2012-02-16 LAB — MRSA PCR SCREENING: MRSA by PCR: NEGATIVE

## 2012-02-16 LAB — CBC
HCT: 28.7 % — ABNORMAL LOW (ref 39.0–52.0)
HCT: 29.3 % — ABNORMAL LOW (ref 39.0–52.0)
Hemoglobin: 10.1 g/dL — ABNORMAL LOW (ref 13.0–17.0)
Hemoglobin: 10.4 g/dL — ABNORMAL LOW (ref 13.0–17.0)
MCH: 24 pg — ABNORMAL LOW (ref 26.0–34.0)
MCH: 25.5 pg — ABNORMAL LOW (ref 26.0–34.0)
MCHC: 35.2 g/dL (ref 30.0–36.0)
MCHC: 35.5 g/dL (ref 30.0–36.0)
MCV: 68.3 fL — ABNORMAL LOW (ref 78.0–100.0)
MCV: 71.8 fL — ABNORMAL LOW (ref 78.0–100.0)
Platelets: 578 10*3/uL — ABNORMAL HIGH (ref 150–400)
Platelets: 355 10*3/uL (ref 150–400)
RBC: 4.2 MIL/uL — ABNORMAL LOW (ref 4.22–5.81)
RBC: 4.08 MIL/uL — ABNORMAL LOW (ref 4.22–5.81)
RDW: 16.7 % — ABNORMAL HIGH (ref 11.5–15.5)
RDW: 18.9 % — ABNORMAL HIGH (ref 11.5–15.5)
WBC: 9.2 10*3/uL (ref 4.0–10.5)
WBC: 12.5 10*3/uL — ABNORMAL HIGH (ref 4.0–10.5)

## 2012-02-16 LAB — URINALYSIS, ROUTINE W REFLEX MICROSCOPIC
Glucose, UA: NEGATIVE mg/dL
Hgb urine dipstick: NEGATIVE
Ketones, ur: 15 mg/dL — AB
Nitrite: POSITIVE — AB
Protein, ur: 30 mg/dL — AB
Specific Gravity, Urine: 1.026 (ref 1.005–1.030)
Urobilinogen, UA: 1 mg/dL (ref 0.0–1.0)
pH: 5 (ref 5.0–8.0)

## 2012-02-16 LAB — PROTIME-INR
INR: 1.32 (ref 0.00–1.49)
Prothrombin Time: 16.6 seconds — ABNORMAL HIGH (ref 11.6–15.2)

## 2012-02-16 LAB — LACTIC ACID, PLASMA
Lactic Acid, Venous: 3.4 mmol/L — ABNORMAL HIGH (ref 0.5–2.2)
Lactic Acid, Venous: 2 mmol/L (ref 0.5–2.2)

## 2012-02-16 LAB — PREPARE RBC (CROSSMATCH)

## 2012-02-16 LAB — ABO/RH: ABO/RH(D): AB POS

## 2012-02-16 LAB — APTT: aPTT: 33 seconds (ref 24–37)

## 2012-02-16 LAB — GLUCOSE, CAPILLARY: Glucose-Capillary: 98 mg/dL (ref 70–99)

## 2012-02-16 LAB — TROPONIN I: Troponin I: <0.3 ng/mL (ref <0.30)

## 2012-02-16 LAB — PROCALCITONIN: Procalcitonin: 28.65 ng/mL

## 2012-02-16 IMAGING — CR DG CHEST 1V PORT
1 series · 1 of 1 positions shown · non-contrast
Comparison: PA and lateral chest [DATE].

CLINICAL DATA: ET tube placement.

PORTABLE CHEST - 1 VIEW

[AP]
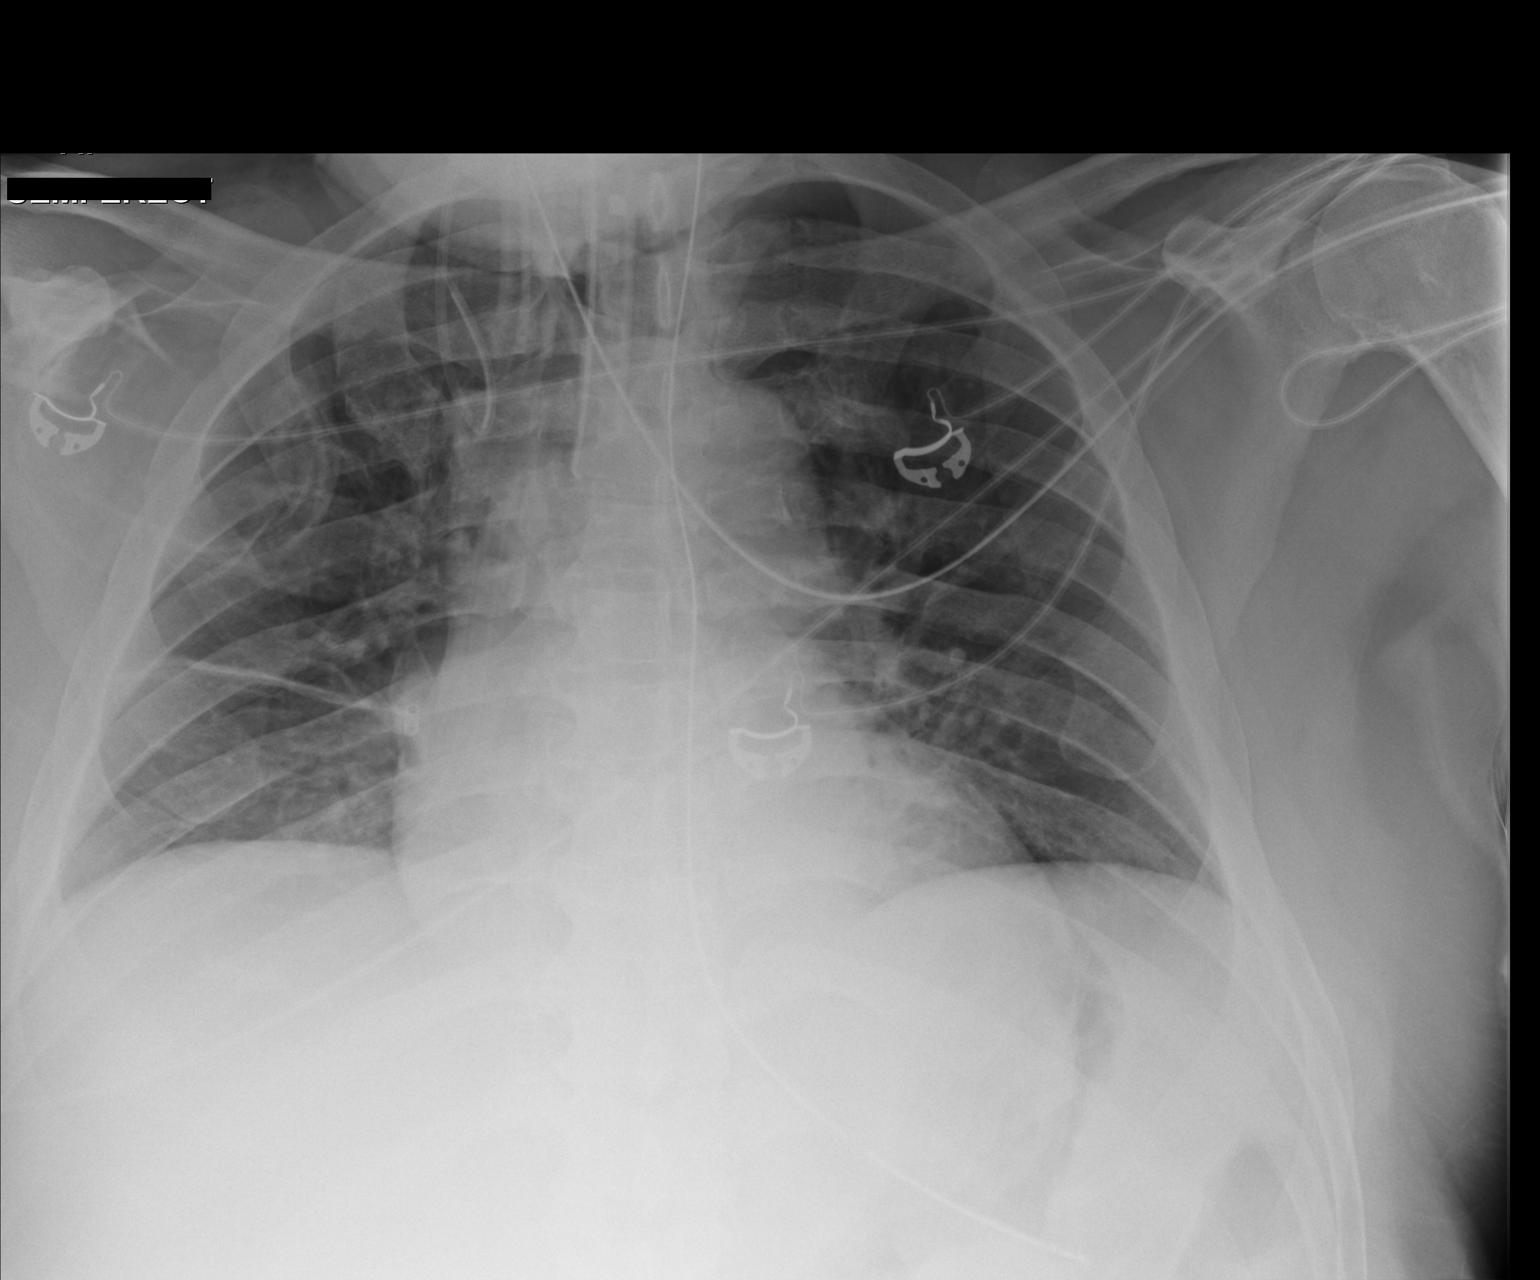

[1 of 1 positions shown; findings below may reference images not displayed]

FINDINGS: NG tube is in place with the tip well positioned in the
stomach.  Endotracheal tube is also identified with the tip 2.1 cm
above the carina.  Right IJ catheter is looped in the inferior vena
cava with the tip projecting retrograde near the right subclavian
vein.  No pneumothorax is identified.  Mild atelectasis in the
right lung base is noted.  Left lung clear.  Cardiomegaly.  Lucency
along the left heart border could be due to pneumopericardium.
IMPRESSION: 1.  ET tube in good position.
2.  Right IJ catheter is looped in the superior vena cava with the
tip projecting retrograde in the right subclavian vein.
3.  Question pneumopericardium.

## 2012-02-16 IMAGING — CR DG ABDOMEN ACUTE W/ 1V CHEST
5 series · 5 of 5 positions shown · non-contrast
Comparison: [DATE]

CLINICAL DATA: Abdominal pain and bloating.  Partial colectomy on
[DATE].  Hypertension.  Diabetes.

ACUTE ABDOMEN SERIES (ABDOMEN 2 VIEW & CHEST 1 VIEW)

[w abdomen decub]
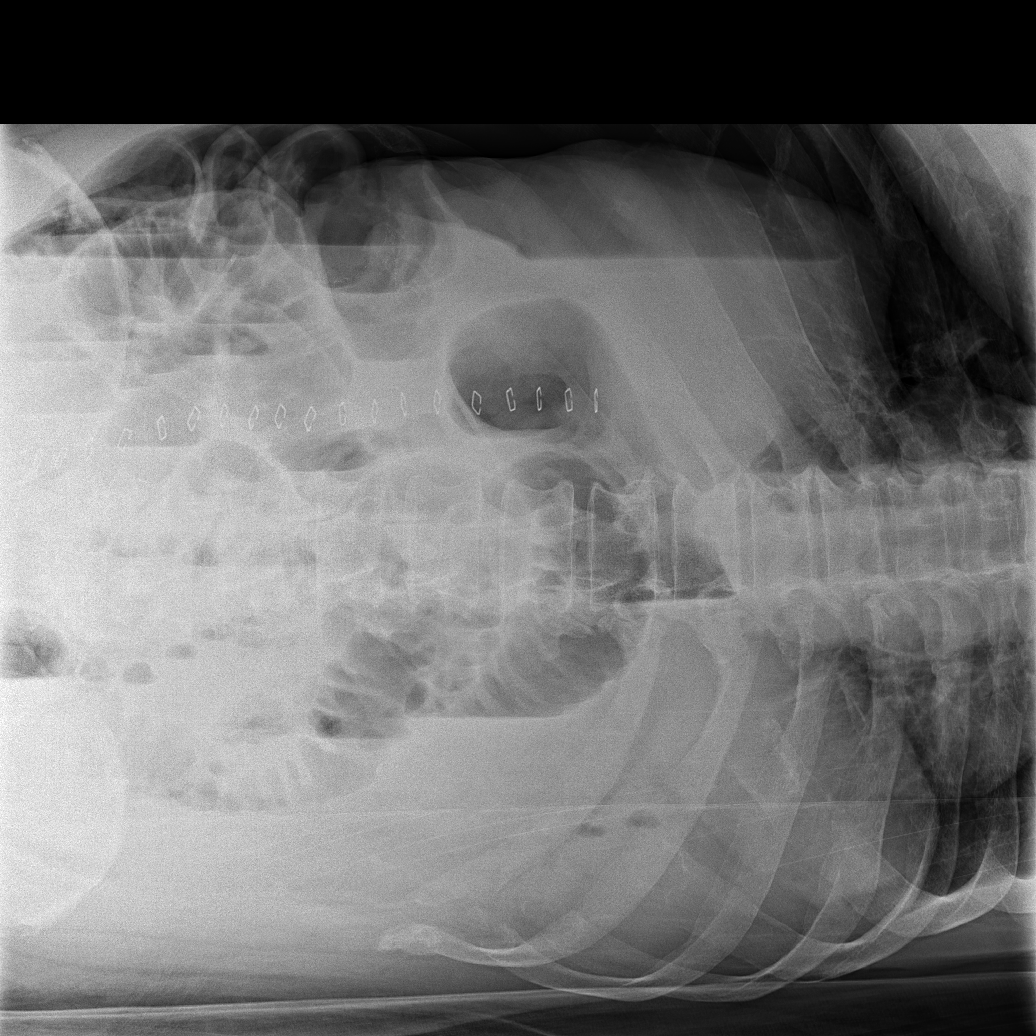

[x abdomen supine (1 of 2)]
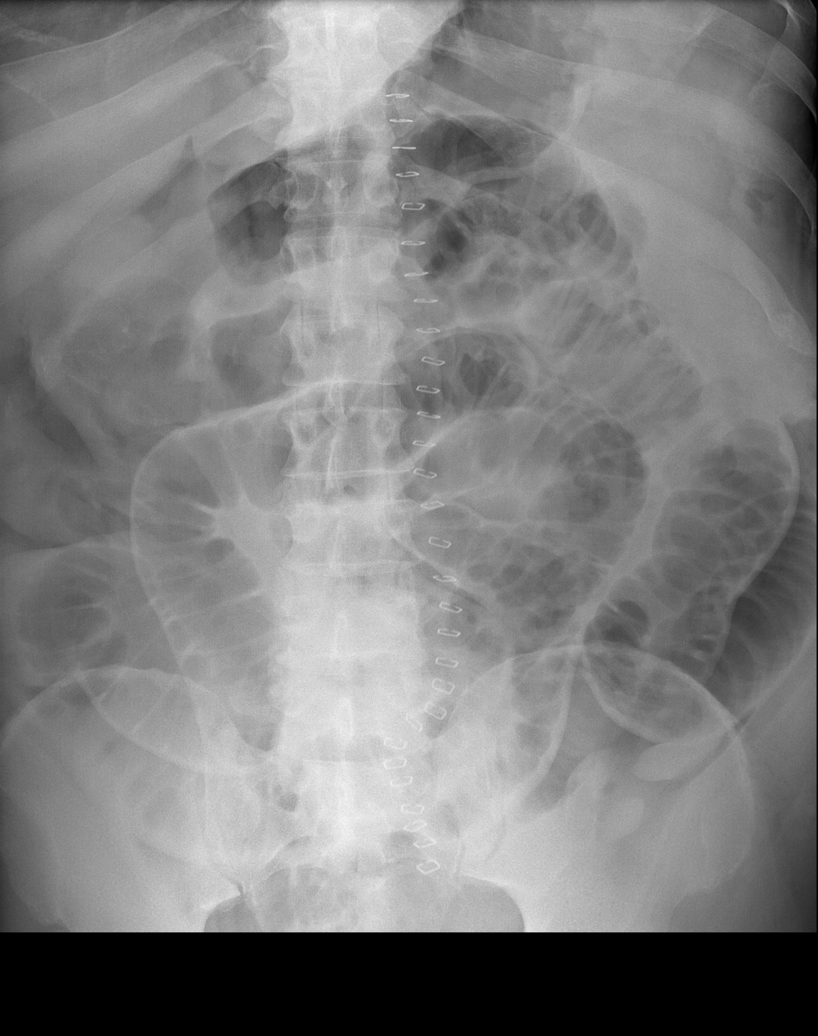

[x abdomen supine (2 of 2)]
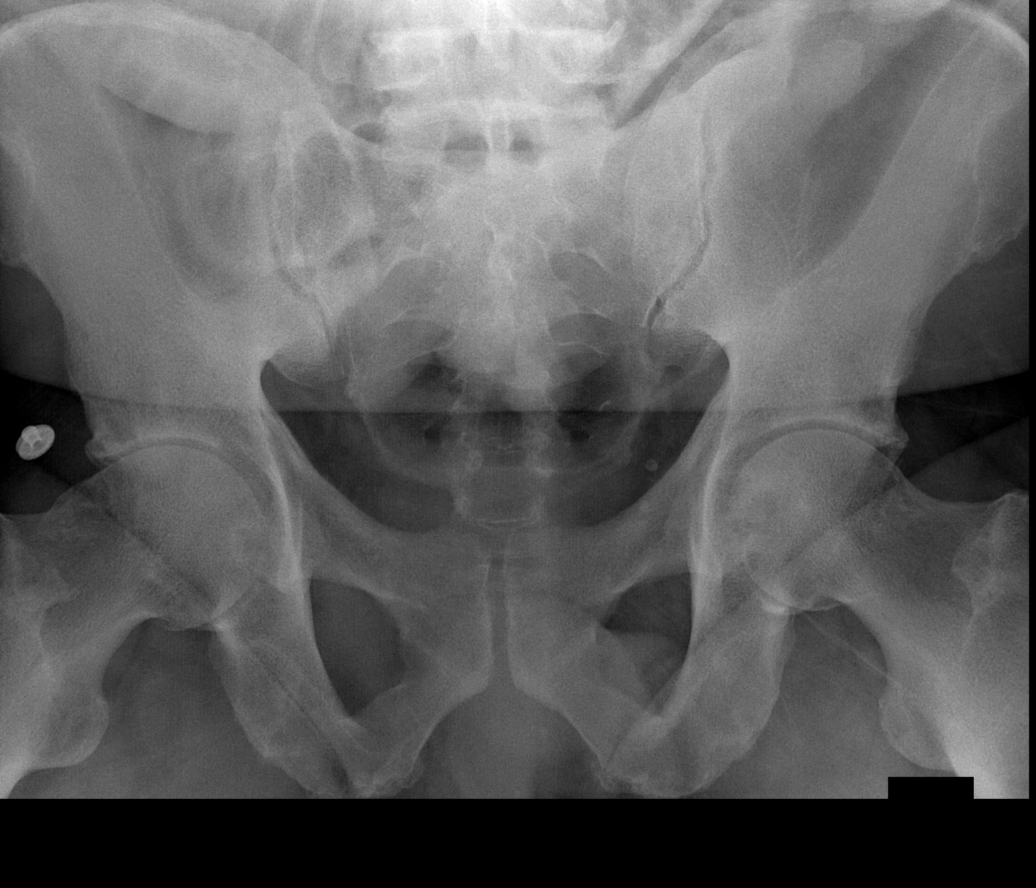

[x chest ap (1 of 2)]
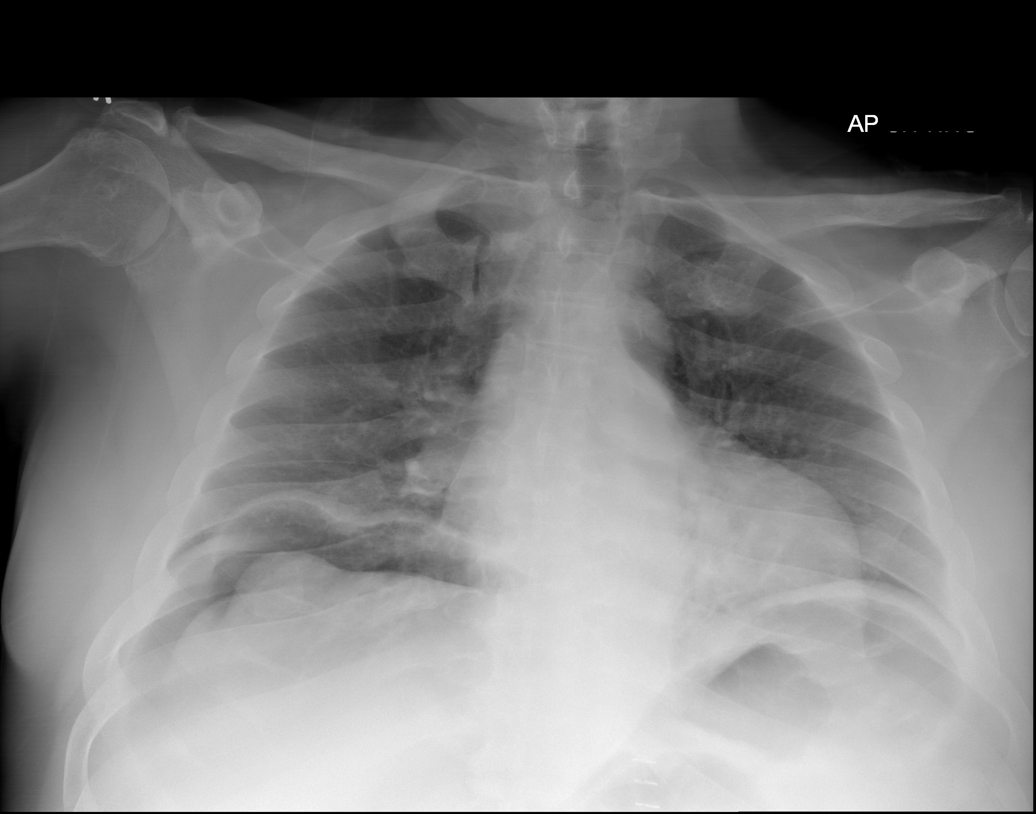

[x chest ap (2 of 2)]
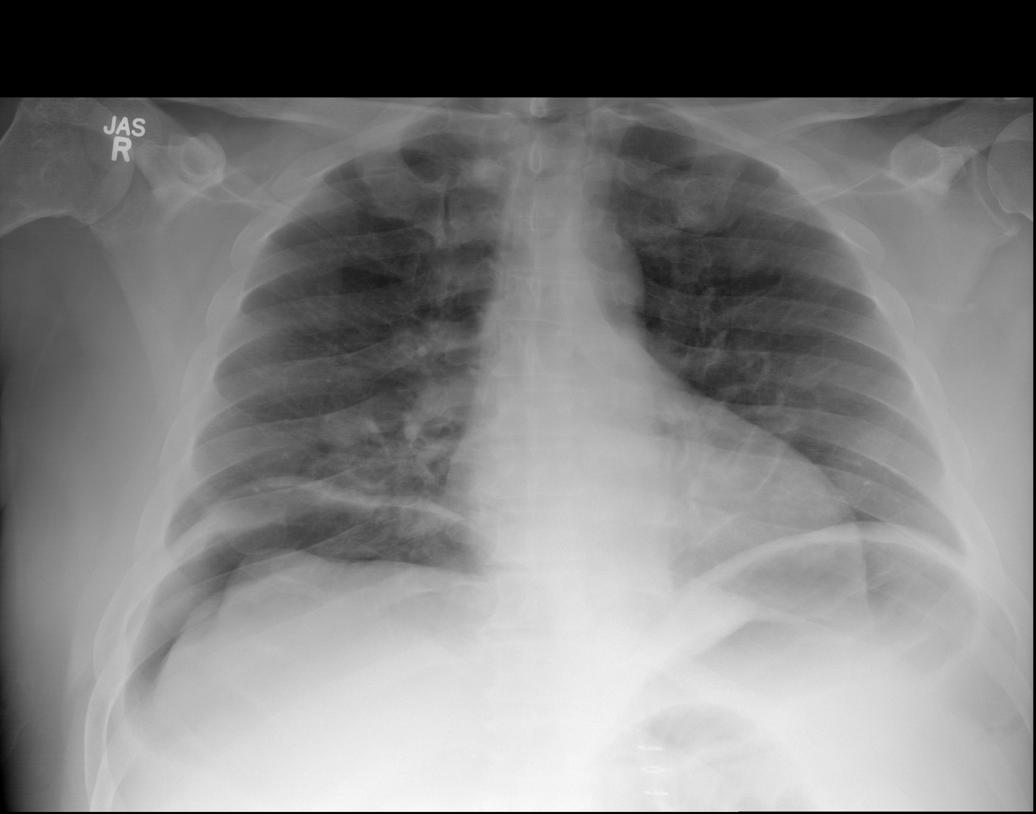

[5 of 5 positions shown; findings below may reference images not displayed]

FINDINGS: Large amount of pneumoperitoneum.  Given the volume, this
is abnormal despite the patient having had surgery 10 days ago.
Also, the prior pneumoperitoneum has resolved back on [DATE],
so this represents new pneumoperitoneum.  The appearance is
compatible with a perforated viscus.

Abnormal dilated loops of small bowel noted with scattered air-
fluid levels.  Small bowel loops measure up to 5 cm in diameter.
Probable obstruction favored over ileus.

Skin clips noted.  Low lung volumes are present on the chest
radiograph, with subsegmental atelectasis in the lung bases.
IMPRESSION: 1.  Prominent (and new from [DATE]) pneumoperitoneum.  At 10
days out from abdominal surgery, and this amount of
pneumoperitoneum is indicative of a perforated viscus.  Emergent
surgical consultation recommended.
2.  Bibasilar subsegmental atelectasis.
3.  Air fluid levels in dilated small bowel favoring small bowel
obstruction over ileus.

Critical Value/emergent results were called by telephone to Dr.
ASERIGADU (who verbally acknowledged these results) at the time
of interpretation on [DATE] at [DATE] p.m..

## 2012-02-16 SURGERY — LAPAROTOMY, EXPLORATORY
Anesthesia: General | Site: Abdomen | Wound class: Dirty or Infected

## 2012-02-16 MED ORDER — SODIUM CHLORIDE 0.9 % IV BOLUS (SEPSIS)
1000.0000 mL | Freq: Once | INTRAVENOUS | Status: AC
  Administered 2012-02-16: 1000 mL via INTRAVENOUS

## 2012-02-16 MED ORDER — ROCURONIUM BROMIDE 100 MG/10ML IV SOLN
INTRAVENOUS | Status: DC | PRN
  Administered 2012-02-16 (×2): 50 mg via INTRAVENOUS

## 2012-02-16 MED ORDER — FENTANYL BOLUS VIA INFUSION
50.0000 ug | Freq: Four times a day (QID) | INTRAVENOUS | Status: DC | PRN
  Filled 2012-02-16: qty 100

## 2012-02-16 MED ORDER — LIDOCAINE HCL (CARDIAC) 20 MG/ML IV SOLN
INTRAVENOUS | Status: DC | PRN
  Administered 2012-02-16: 100 mg via INTRAVENOUS

## 2012-02-16 MED ORDER — HYDROMORPHONE HCL PF 1 MG/ML IJ SOLN
1.0000 mg | Freq: Once | INTRAMUSCULAR | Status: AC
  Administered 2012-02-16: 1 mg via INTRAVENOUS
  Filled 2012-02-16: qty 1

## 2012-02-16 MED ORDER — SODIUM CHLORIDE 0.9 % IV BOLUS (SEPSIS): 500.0000 mL | Freq: Once | INTRAVENOUS | Status: DC

## 2012-02-16 MED ORDER — MIDAZOLAM BOLUS VIA INFUSION
1.0000 mg | INTRAVENOUS | Status: DC | PRN
  Filled 2012-02-16: qty 2

## 2012-02-16 MED ORDER — INSULIN GLARGINE 100 UNIT/ML ~~LOC~~ SOLN: 10.0000 [IU] | Freq: Every day | SUBCUTANEOUS | Status: DC

## 2012-02-16 MED ORDER — ALBUMIN HUMAN 5 % IV SOLN
INTRAVENOUS | Status: DC | PRN
  Administered 2012-02-16 (×2): via INTRAVENOUS

## 2012-02-16 MED ORDER — DEXTROSE 5 % IV SOLN
1.0000 g | Freq: Once | INTRAVENOUS | Status: AC
  Administered 2012-02-16: 1 g via INTRAVENOUS
  Filled 2012-02-16: qty 10

## 2012-02-16 MED ORDER — ONDANSETRON HCL 4 MG/2ML IJ SOLN: 4.0000 mg | Freq: Four times a day (QID) | INTRAMUSCULAR | Status: DC | PRN

## 2012-02-16 MED ORDER — SODIUM CHLORIDE 0.9 % IV BOLUS (SEPSIS): 1000.0000 mL | Freq: Once | INTRAVENOUS | Status: DC

## 2012-02-16 MED ORDER — SODIUM CHLORIDE 0.9 % IV SOLN
INTRAVENOUS | Status: DC
  Administered 2012-02-16 – 2012-02-17 (×2): via INTRAVENOUS
  Filled 2012-02-16 (×4): qty 1000

## 2012-02-16 MED ORDER — HYDROMORPHONE HCL PF 1 MG/ML IJ SOLN: 0.2500 mg | INTRAMUSCULAR | Status: DC | PRN

## 2012-02-16 MED ORDER — FENTANYL CITRATE 0.05 MG/ML IJ SOLN
INTRAMUSCULAR | Status: DC | PRN
  Administered 2012-02-16 (×2): 100 ug via INTRAVENOUS
  Administered 2012-02-16: 150 ug via INTRAVENOUS
  Administered 2012-02-16: 100 ug via INTRAVENOUS

## 2012-02-16 MED ORDER — SODIUM CHLORIDE 0.9 % IV SOLN
INTRAVENOUS | Status: DC | PRN
  Administered 2012-02-16 (×2): via INTRAVENOUS

## 2012-02-16 MED ORDER — HYDROMORPHONE HCL PF 1 MG/ML IJ SOLN: 1.0000 mg | Freq: Once | INTRAMUSCULAR | Status: DC

## 2012-02-16 MED ORDER — SODIUM CHLORIDE 0.9 % IV SOLN
50.0000 ug/h | INTRAVENOUS | Status: DC
  Administered 2012-02-16: 100 ug/h via INTRAVENOUS
  Administered 2012-02-17: 200 ug/h via INTRAVENOUS
  Administered 2012-02-17: 250 ug/h via INTRAVENOUS
  Administered 2012-02-18: 300 ug/h via INTRAVENOUS
  Filled 2012-02-16 (×4): qty 50

## 2012-02-16 MED ORDER — SODIUM CHLORIDE 0.9 % IV SOLN: INTRAVENOUS | Status: DC

## 2012-02-16 MED ORDER — ETOMIDATE 2 MG/ML IV SOLN
INTRAVENOUS | Status: DC | PRN
  Administered 2012-02-16: 18 mg via INTRAVENOUS

## 2012-02-16 MED ORDER — SODIUM CHLORIDE 0.9 % IV SOLN
1.0000 g | INTRAVENOUS | Status: DC
  Administered 2012-02-17 – 2012-02-18 (×3): 1 g via INTRAVENOUS
  Filled 2012-02-16 (×4): qty 1

## 2012-02-16 MED ORDER — PANTOPRAZOLE SODIUM 40 MG IV SOLR
40.0000 mg | Freq: Every day | INTRAVENOUS | Status: DC
  Administered 2012-02-16 – 2012-03-01 (×15): 40 mg via INTRAVENOUS
  Filled 2012-02-16 (×20): qty 40

## 2012-02-16 MED ORDER — ONDANSETRON HCL 4 MG/2ML IJ SOLN
4.0000 mg | Freq: Once | INTRAMUSCULAR | Status: AC
  Administered 2012-02-16: 4 mg via INTRAVENOUS
  Filled 2012-02-16: qty 2

## 2012-02-16 MED ORDER — MORPHINE SULFATE 2 MG/ML IJ SOLN: 1.0000 mg | INTRAMUSCULAR | Status: DC | PRN

## 2012-02-16 MED ORDER — MIDAZOLAM HCL 5 MG/5ML IJ SOLN
INTRAMUSCULAR | Status: DC | PRN
  Administered 2012-02-16 (×2): 2 mg via INTRAVENOUS

## 2012-02-16 MED ORDER — PROMETHAZINE HCL 25 MG/ML IJ SOLN: 6.2500 mg | INTRAMUSCULAR | Status: DC | PRN

## 2012-02-16 MED ORDER — INSULIN ASPART 100 UNIT/ML ~~LOC~~ SOLN
0.0000 [IU] | SUBCUTANEOUS | Status: DC
  Administered 2012-02-18 – 2012-02-19 (×5): 3 [IU] via SUBCUTANEOUS
  Administered 2012-02-19: 2 [IU] via SUBCUTANEOUS
  Administered 2012-02-20 (×2): 3 [IU] via SUBCUTANEOUS
  Administered 2012-02-21: 2 [IU] via SUBCUTANEOUS
  Administered 2012-02-21 – 2012-02-22 (×3): 3 [IU] via SUBCUTANEOUS
  Administered 2012-02-22: 4 [IU] via SUBCUTANEOUS
  Administered 2012-02-23: 3 [IU] via SUBCUTANEOUS
  Administered 2012-02-23 – 2012-02-24 (×2): 4 [IU] via SUBCUTANEOUS
  Administered 2012-02-24: 3 [IU] via SUBCUTANEOUS
  Administered 2012-02-24 (×2): 4 [IU] via SUBCUTANEOUS
  Administered 2012-02-24 (×2): 3 [IU] via SUBCUTANEOUS
  Administered 2012-02-25 (×5): 4 [IU] via SUBCUTANEOUS
  Administered 2012-02-25 – 2012-02-26 (×2): 7 [IU] via SUBCUTANEOUS
  Administered 2012-02-26 (×2): 3 [IU] via SUBCUTANEOUS
  Administered 2012-02-26 (×2): 4 [IU] via SUBCUTANEOUS
  Administered 2012-02-27: 7 [IU] via SUBCUTANEOUS
  Administered 2012-02-27 (×2): 4 [IU] via SUBCUTANEOUS
  Administered 2012-02-27: 7 [IU] via SUBCUTANEOUS
  Administered 2012-02-28: 4 [IU] via SUBCUTANEOUS
  Administered 2012-02-28: 11 [IU] via SUBCUTANEOUS
  Administered 2012-02-28: 15 [IU] via SUBCUTANEOUS
  Administered 2012-02-29: 3 [IU] via SUBCUTANEOUS
  Administered 2012-02-29: 4 [IU] via SUBCUTANEOUS
  Administered 2012-02-29: 11 [IU] via SUBCUTANEOUS
  Administered 2012-02-29 (×4): 4 [IU] via SUBCUTANEOUS
  Administered 2012-03-01 (×2): 7 [IU] via SUBCUTANEOUS
  Administered 2012-03-01 (×3): 4 [IU] via SUBCUTANEOUS
  Administered 2012-03-02: 3 [IU] via SUBCUTANEOUS
  Administered 2012-03-02 (×3): 4 [IU] via SUBCUTANEOUS
  Administered 2012-03-02 – 2012-03-04 (×8): 3 [IU] via SUBCUTANEOUS
  Administered 2012-03-05: 4 [IU] via SUBCUTANEOUS
  Administered 2012-03-05 (×3): 3 [IU] via SUBCUTANEOUS
  Administered 2012-03-06: 4 [IU] via SUBCUTANEOUS
  Administered 2012-03-06: 7 [IU] via SUBCUTANEOUS
  Administered 2012-03-06: 4 [IU] via SUBCUTANEOUS
  Administered 2012-03-07: 2 [IU] via SUBCUTANEOUS
  Administered 2012-03-07: 4 [IU] via SUBCUTANEOUS
  Administered 2012-03-07: 15 [IU] via SUBCUTANEOUS
  Administered 2012-03-08 (×2): 4 [IU] via SUBCUTANEOUS
  Administered 2012-03-09 (×2): 3 [IU] via SUBCUTANEOUS
  Administered 2012-03-09: 7 [IU] via SUBCUTANEOUS
  Administered 2012-03-09: 4 [IU] via SUBCUTANEOUS

## 2012-02-16 MED ORDER — SODIUM CHLORIDE 0.9 % IV SOLN
1.0000 g | INTRAVENOUS | Status: AC
  Administered 2012-02-16: 1 g via INTRAVENOUS
  Filled 2012-02-16: qty 1

## 2012-02-16 MED ORDER — SODIUM CHLORIDE 0.9 % IV SOLN
1.0000 g | INTRAVENOUS | Status: DC | PRN
  Administered 2012-02-16: 1 g via INTRAVENOUS

## 2012-02-16 MED ORDER — SODIUM CHLORIDE 0.9 % IV SOLN
2.0000 mg/h | INTRAVENOUS | Status: DC
  Administered 2012-02-16: 2 mg/h via INTRAVENOUS
  Administered 2012-02-17: 4 mg/h via INTRAVENOUS
  Filled 2012-02-16 (×2): qty 10

## 2012-02-16 MED ORDER — PHENYLEPHRINE HCL 10 MG/ML IJ SOLN
10.0000 mg | INTRAVENOUS | Status: DC | PRN
  Administered 2012-02-16: 100 ug/min via INTRAVENOUS

## 2012-02-16 MED ORDER — MIDAZOLAM HCL 2 MG/2ML IJ SOLN: 1.0000 mg | INTRAMUSCULAR | Status: DC | PRN

## 2012-02-16 MED ORDER — LACTATED RINGERS IV SOLN
INTRAVENOUS | Status: DC | PRN
  Administered 2012-02-16 (×2): via INTRAVENOUS

## 2012-02-16 MED ORDER — 0.9 % SODIUM CHLORIDE (POUR BTL) OPTIME
TOPICAL | Status: DC | PRN
  Administered 2012-02-16: 6000 mL

## 2012-02-16 MED ORDER — SODIUM BICARBONATE 8.4 % IV SOLN
INTRAVENOUS | Status: DC | PRN
  Administered 2012-02-16 (×2): 50 meq via INTRAVENOUS

## 2012-02-16 MED ORDER — PANTOPRAZOLE SODIUM 40 MG IV SOLR: 40.0000 mg | INTRAVENOUS | Status: DC

## 2012-02-16 MED ORDER — SUCCINYLCHOLINE CHLORIDE 20 MG/ML IJ SOLN
INTRAMUSCULAR | Status: DC | PRN
  Administered 2012-02-16: 140 mg via INTRAVENOUS

## 2012-02-16 SURGICAL SUPPLY — 47 items
BLADE SURG ROTATE 9660 (MISCELLANEOUS) IMPLANT
CANISTER SUCTION 2500CC (MISCELLANEOUS) ×5 IMPLANT
CANISTER WOUND CARE 500ML ATS (WOUND CARE) ×1 IMPLANT
CHLORAPREP W/TINT 26ML (MISCELLANEOUS) ×2 IMPLANT
CLOTH BEACON ORANGE TIMEOUT ST (SAFETY) ×2 IMPLANT
COVER SURGICAL LIGHT HANDLE (MISCELLANEOUS) ×2 IMPLANT
DRAPE LAPAROSCOPIC ABDOMINAL (DRAPES) ×2 IMPLANT
DRAPE UTILITY 15X26 W/TAPE STR (DRAPE) ×4 IMPLANT
DRAPE WARM FLUID 44X44 (DRAPE) ×2 IMPLANT
ELECT BLADE 6.5 EXT (BLADE) ×2 IMPLANT
ELECT REM PT RETURN 9FT ADLT (ELECTROSURGICAL) ×2
ELECTRODE REM PT RTRN 9FT ADLT (ELECTROSURGICAL) ×1 IMPLANT
GLOVE BIO SURGEON STRL SZ8 (GLOVE) ×3 IMPLANT
GLOVE BIOGEL PI IND STRL 8 (GLOVE) ×1 IMPLANT
GLOVE BIOGEL PI INDICATOR 8 (GLOVE) ×2
GOWN PREVENTION PLUS XLARGE (GOWN DISPOSABLE) ×2 IMPLANT
GOWN SRG XL XLNG 56XLVL 4 (GOWN DISPOSABLE) IMPLANT
GOWN STRL NON-REIN LRG LVL3 (GOWN DISPOSABLE) ×4 IMPLANT
GOWN STRL NON-REIN XL XLG LVL4 (GOWN DISPOSABLE) ×2
GOWN STRL REIN 2XL XLG LVL4 (GOWN DISPOSABLE) ×1 IMPLANT
KIT BASIN OR (CUSTOM PROCEDURE TRAY) ×2 IMPLANT
KIT OSTOMY DRAINABLE 2.75 STR (WOUND CARE) ×1 IMPLANT
KIT ROOM TURNOVER OR (KITS) ×2 IMPLANT
LIGASURE IMPACT 36 18CM CVD LR (INSTRUMENTS) IMPLANT
NS IRRIG 1000ML POUR BTL (IV SOLUTION) ×8 IMPLANT
PACK GENERAL/GYN (CUSTOM PROCEDURE TRAY) ×2 IMPLANT
PAD ARMBOARD 7.5X6 YLW CONV (MISCELLANEOUS) ×4 IMPLANT
RELOAD PROXIMATE 75MM BLUE (ENDOMECHANICALS) ×2 IMPLANT
RELOAD STAPLE 75 3.8 BLU REG (ENDOMECHANICALS) IMPLANT
SPECIMEN JAR X LARGE (MISCELLANEOUS) IMPLANT
SPONGE ABDOMINAL VAC ABTHERA (MISCELLANEOUS) ×1 IMPLANT
SPONGE GAUZE 4X4 12PLY (GAUZE/BANDAGES/DRESSINGS) ×2 IMPLANT
SPONGE LAP 18X18 X RAY DECT (DISPOSABLE) ×2 IMPLANT
STAPLER PROXIMATE 75MM BLUE (STAPLE) ×2 IMPLANT
STAPLER VISISTAT 35W (STAPLE) ×2 IMPLANT
SUCTION POOLE TIP (SUCTIONS) ×2 IMPLANT
SUT PDS AB 1 TP1 96 (SUTURE) ×4 IMPLANT
SUT SILK 2 0 SH CR/8 (SUTURE) ×2 IMPLANT
SUT SILK 2 0 TIES 10X30 (SUTURE) ×2 IMPLANT
SUT SILK 3 0 SH CR/8 (SUTURE) ×2 IMPLANT
SUT SILK 3 0 TIES 10X30 (SUTURE) ×2 IMPLANT
SUT VIC AB 3-0 SH 18 (SUTURE) ×1 IMPLANT
TOWEL OR 17X24 6PK STRL BLUE (TOWEL DISPOSABLE) ×2 IMPLANT
TOWEL OR 17X26 10 PK STRL BLUE (TOWEL DISPOSABLE) ×2 IMPLANT
TRAY FOLEY CATH 14FRSI W/METER (CATHETERS) ×1 IMPLANT
WATER STERILE IRR 1000ML POUR (IV SOLUTION) ×1 IMPLANT
YANKAUER SUCT BULB TIP NO VENT (SUCTIONS) ×1 IMPLANT

## 2012-02-16 NOTE — ED Notes (Signed)
Pt with HX of colon resection week ago wed, , BM this am, c/o pain in the right side of the abdomen, hypoactive BS, taut abdomen, pt reports normal, staples from epigastrium to umbilicus intact, difficulty urinating

## 2012-02-16 NOTE — Consult Note (Signed)
Name: [name] MRN: [id number] DOB: Dec 06, 1950    LOS: 0  Referring Provider:  [name] of [facility] Reason for Referral:  Post op resp failure, metabolic acidosis and hypotension PCP is [name], MD  Hx obtained from chart extraction in epic. Patient unable to provide hx  PULMONARY / CRITICAL CARE MEDICINE  HPI:  Patient is a 61 year old gentleman smoker, OSA on cpap, Obese, who underwent colonoscopy by [name] and was found to have a 3-4 cm sessile mass the distal transverse colon. 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 On 02/06/12 underwent laparoscopic-assisted mobilization splenic flexure, right hemicolectomy with primary anastomosis.  Postop had acute renal failure with postoperative hypovolemia, which resolved. He also had a postop ileus. He was discharged on 02/13/12. He had some difficulty getting out of bed before discharge became worse on Friday, 02/14/12. He was unable to eat more than a few bites. Saturday 02/15/12, he had increasing abdominal pain ongoing nausea some vomiting again he was unable to eat.  Then on 02/16/12 presented with  large amount of pneumoperitoneum felt to be extremely large even having had surgery several days. There was a normally dilated loops of small bowel with scattered air-fluid levels bowel loops measuring up to 5 cm in diameter. Concerning for obstruction and possible perforation.   Labs show normal white count creatinine was up to 2.26 and  CO2 is 15. EKG shows some inferior changes, but nonspecific.  He then on  02/16/12 underwent emergent  exploratory laparotomy and found to have anastomitic leak (not perforated viscus) and s/p resection ileocolonic anastamosis and ileostomy and application of wound vac. Operative course complicated by hypotension and blood loss needing 2U PRBC . Postoperative remained vent dependent and abg showed metabolic acidosis with somewhat poor resp compensation. BP soft wit MAP 64. PCCM consulted  02/16/2012 for med mgmt   DEVICES ETT 02/16/2012 Rt IJ 02/16/2012   Past Medical History  Diagnosis Date  . Hyperlipidemia   . Hypertension   . Heart murmur   . Diabetes mellitus     type 2 IDDM x 15 years  . Sleep apnea     does not wear CPAP now  . Colon polyp   . Arthritis     left knee  . 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 Colonoscopy   . Ganglion cyst excision 20 YRS AGO    RT ARM  . Partial colectomy 02/06/2012  . Partial colectomy 02/06/2012    Procedure: PARTIAL COLECTOMY;  Surgeon: Imogene Burn. [name], MD;  Location: [facility];  Service: General;  Laterality: N/A;  right partial colectomy  . Colonoscopy 02/06/2012    Procedure: COLONOSCOPY;  Surgeon: Milus Banister, MD;  Location: [place];  Service: Endoscopy;;   Prior to Admission medications   Medication Sig Start Date End Date Taking? Authorizing Provider  atorvastatin (LIPITOR) 20 MG tablet Take 20 mg by mouth at bedtime.   Yes Historical Provider, MD  carvedilol (COREG) 6.25 MG tablet Take 1 tablet (6.25 mg total) by mouth 2 (two) times daily with a meal. 02/13/12 02/12/13 Yes Matthew K. Tsuei, MD  insulin NPH-insulin regular (NOVOLIN 70/30) (70-30) 100 UNIT/ML injection Inject 10-30 Units into the skin 2 (two) times daily with a meal. Inject 30 units in the morning and 10 units at bedtime.   Yes Historical Provider, MD  metFORMIN (GLUCOPHAGE) 500 MG tablet Take 1,000 mg by mouth 2 (two)  times daily with a meal.   Yes Historical Provider, MD  oxyCODONE-acetaminophen (PERCOCET/ROXICET) 5-325 MG per tablet Take 1-2 tablets by mouth every 6 (six) hours as needed. 02/13/12 02/23/12 Yes Matthew K. Tsuei, MD   Allergies No Known Allergies  Family History Family History  Problem Relation Age of Onset  . Diabetes Father   . Heart disease Father   . Colon cancer Neg Hx   . Diabetes Sister   . Heart disease Sister   . Heart disease Mother    Social History   reports that he has been smoking Cigarettes.  He has a 15 pack-year smoking history. He has never used smokeless tobacco. He reports that he drinks alcohol. He reports that he does not use illicit drugs.  Events Since Admission: 02/16/2012 > P lap. Admit   Current Status: Critical  Vital Signs: Temp:  [97.5 F (36.4 C)-98.6 F (37 C)] 97.5 F (36.4 C) (09/15 1830) Pulse Rate:  [80-104] 94  (09/15 1830) Resp:  [12-24] 12  (09/15 1830) BP: (80-144)/(47-103) 144/76 mmHg (09/15 1800) SpO2:  [96 %-100 %] 96 % (09/15 1830) Arterial Line BP: (85)/(51) 85/51 mmHg (09/15 1830) FiO2 (%):  [49.9 %-50 %] 50 % (09/15 1840) Weight:  [106.595 kg (235 lb)] 106.595 kg (235 lb) (09/15 1830)  Physical Examination: General:  Obese male. On vent. Looks critically ill Neuro:  Deeply sedated with fent and versed gtt HEENT:  ETT tube + Neck:  Soft, R IJ + Cardiovascular:  HR 104. MAP 70, S1S2+ Lungs:  PRVC. No vent dysnchrony Abdomen:  Obese, No bowel sounds. Wound VAC + Musculoskeletal:  No cyanosis. No clubbing. No edema Skin:  Intact  Dg Abd Acute W/chest  02/16/2012  *RADIOLOGY REPORT*  Clinical Data: Abdominal pain and bloating.  Partial colectomy on 02/06/2012.  Hypertension.  Diabetes.  ACUTE ABDOMEN SERIES (ABDOMEN 2 VIEW & CHEST 1 VIEW)  Comparison: 02/11/2012  Findings: Large amount of pneumoperitoneum.  Given the volume, this is abnormal despite the patient having had surgery 10 days ago. Also, the prior pneumoperitoneum has resolved back on 02/11/2012, so this represents new pneumoperitoneum.  The appearance is compatible with a perforated viscus.  Abnormal dilated loops of small bowel noted with scattered air- fluid levels.  Small bowel loops measure up to 5 cm in diameter. Probable obstruction favored over ileus.  Skin clips noted.  Low lung volumes are present on the chest radiograph, with subsegmental atelectasis in the lung bases.  IMPRESSION:  1.  Prominent (and new from 02/11/2012)  pneumoperitoneum.  At 10 days out from abdominal surgery, and this amount of pneumoperitoneum is indicative of a perforated viscus.  Emergent surgical consultation recommended. 2.  Bibasilar subsegmental atelectasis. 3.  Air fluid levels in dilated small bowel favoring small bowel obstruction over ileus.  Critical Value/emergent results were called by telephone to Dr. Verl Dicker (who verbally acknowledged these results) at the time of interpretation on 02/16/2012 at 2:08 p.m.   Original Report Authenticated By: Carron Curie, M.D.      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 ABG has poor resp compensation to metab acidosis  P:   Full vent support increase rr to 18 VT 8cc/kg/IBW Recheck abg 21:00h  CARDIOVASCULAR  Lab 02/16/12 1504 02/16/12 1450  TROPONINI <0.30 --  LATICACIDVEN -- 3.4*  PROBNP -- --    A: Hypotension. Suggestive of occult septic shock P:  CVP monitoring - fluids if < 12h Check lactate stat, STart EGDT sepsis protocl if lacate does not clear  RENAL  Lab 02/16/12 1236 02/13/12 0520 02/12/12 0520 02/11/12 0605 02/10/12 0555  NA 135 138 139 143 140  K 4.4 4.0 -- -- --  CL 105 106 108 110 108  CO2 15* 22 22 22 24   BUN 30* 24* 24* 27* 23  CREATININE 2.26* 1.19 1.39* 1.46* 1.31  CALCIUM 8.7 8.8 8.2* 8.9 8.7  MG  -- -- -- -- --  PHOS -- -- -- -- --   Intake/Output      09/14 0701 - 09/15 0700 09/15 0701 - 09/16 0700   I.V. (mL/kg)  2270 (21.3)   Blood  700   IV Piggyback  500   Total Intake(mL/kg)  3470 (32.6)   Urine (mL/kg/hr)  20 (0)   Blood  250   Total Output  270   Net  +3200         Foley: 02/16/2012   A:  Acute renal fialure preop likel due to peritionitis and volume depltion P:   cvp goal > 12 MAP goal > 65 Fluid resus  GASTROINTESTINAL  Lab 02/16/12 1258  AST 32  ALT 25  ALKPHOS 66  BILITOT 1.1  PROT 6.8  ALBUMIN 2.6*    A:  Acute anastomotic leak pertionitis P:   Per [facility]  HEMATOLOGIC  Lab 02/16/12 1504 02/16/12 1236 02/12/12 0520 02/10/12 0555  HGB -- 10.1* 8.5* 9.3*  HCT -- 28.7* 24.8* 26.2*  PLT -- 578* 272 241  INR 1.32 -- -- --  APTT 33 -- -- --   A:  S/p 2u prbc in OR for blood loss P:  Check post op cbc  INFECTIOUS  Lab 02/16/12 1236 02/12/12 0520 02/10/12 0555  WBC 9.2 7.5 9.3  PROCALCITON -- -- --   Cultures: Results for orders placed during the hospital encounter of 01/30/12  SURGICAL PCR SCREEN     Status: Normal   Collection Time   01/30/12 10:16 AM      Component Value Range Status Comment   MRSA, PCR NEGATIVE  NEGATIVE Final    Staphylococcus aureus NEGATIVE  NEGATIVE Final     Antibiotics: Anti-infectives     Start     Dose/Rate Route Frequency Ordered Stop   02/16/12 1615   ertapenem (INVANZ) 1 g in sodium chloride 0.9 % 50 mL IVPB        1 g 100 mL/hr over 30 Minutes Intravenous To Surgery 02/16/12 1606 02/16/12 1733   02/16/12 1600   ertapenem (INVANZ) 1 g in sodium chloride 0.9 % 50 mL IVPB        1 g 100 mL/hr over 30 Minutes Intravenous Every 24 hours 02/16/12 1508     02/16/12 1415   cefTRIAXone (ROCEPHIN) 1 g in dextrose 5 % 50 mL IVPB  1 g 100 mL/hr over 30 Minutes Intravenous  Once 02/16/12 1407 02/16/12 1448           A:  Peritonitis. SEvere sepsis P:   ABX per [facility]  ENDOCRINE  Lab 02/13/12  1324 02/13/12 1254 02/13/12 0806 02/12/12 2146 02/12/12 1631  GLUCAP 74 65* 161* 136* 104*   A:  Known DM   P:   ICU hyperglycemia protocol  NEUROLOGIC  A:  Deeply sedated P:   Pain control and sedation  BEST PRACTICE / DISPOSITION Level of Care:  ICU Primary Service:  [facility] Consultants:  PCCM Code Status:  Full Diet:  NPO DVT Px:  SCD GI Px:  Protonix Skin Integrity:  Intact Social / Family:  None at bedside     The patient is critically ill with multiple organ systems failure and requires high complexity decision making for assessment and support, frequent evaluation and titration of therapies, application of advanced monitoring technologies and extensive interpretation of multiple databases.   Critical Care Time devoted to patient care services described in this note is  45  Minutes.  Dr. Brand Males, M.D., [facility]C.P Pulmonary and Critical Care Medicine Staff Physician [facility] Pager: [phone], If no answer or between  15:00h - 7:00h: call [phone]  02/16/2012 7:08 PM

## 2012-02-16 NOTE — Anesthesia Preprocedure Evaluation (Addendum)
Anesthesia Evaluation  Patient identified by MRN, date of birth, ID band Patient awake    Reviewed: Allergy & Precautions, H&P , NPO status , Patient's Chart, lab work & pertinent test results, reviewed documented beta blocker date and time   History of Anesthesia Complications Negative for: history of anesthetic complications  Airway Mallampati: II TM Distance: >3 FB Neck ROM: Full    Dental  (+) Teeth Intact, Edentulous Upper and Dental Advisory Given   Pulmonary sleep apnea and Continuous Positive Airway Pressure Ventilation , COPDCurrent Smoker,  + rhonchi   Pulmonary exam normal       Cardiovascular hypertension, Pt. on medications and Pt. on home beta blockers + Valvular Problems/Murmurs Rhythm:Regular Rate:Tachycardia     Neuro/Psych negative neurological ROS  negative psych ROS   GI/Hepatic negative GI ROS, Neg liver ROS, Recent lap partial colectomy now with free air    Endo/Other  diabetes, Well Controlled, Type 2, Oral Hypoglycemic Agents and Insulin Dependent  Renal/GU ARFRenal diseasenegative Renal ROSCreat 2.4 9/15   Now 1.4   negative genitourinary   Musculoskeletal negative musculoskeletal ROS (+)   Abdominal (+) + obese,  Abdomen: tender.    Peds  Hematology microcitic anemia   Anesthesia Other Findings   Reproductive/Obstetrics negative OB ROS                        Anesthesia Physical Anesthesia Plan  ASA: III and Emergent  Anesthesia Plan: General   Post-op Pain Management:    Induction: Rapid sequence, Cricoid pressure planned and Intravenous  Airway Management Planned: Oral ETT  Additional Equipment:   Intra-op Plan:   Post-operative Plan: Possible Post-op intubation/ventilation  Informed Consent: I have reviewed the patients History and Physical, chart, labs and discussed the procedure including the risks, benefits and alternatives for the proposed  anesthesia with the patient or authorized representative who has indicated his/her understanding and acceptance.   Dental advisory given  Plan Discussed with: CRNA and Surgeon  Anesthesia Plan Comments:        Anesthesia Quick Evaluation

## 2012-02-16 NOTE — H&P (Signed)
Patient examined and I agree with the assessment and plan See my other note. Georganna Skeans, MD, MPH, FACS Pager: [phone]  02/16/2012 6:22 PM.

## 2012-02-16 NOTE — H&P (Addendum)
Rodney Jarred, MD Physician Signed Surgery H&P 02/16/2012 3:13 PM  [name] is an 61 y.o. male.    Chief Complaint: Abdominal pain, unable to eat, some nausea and vomiting. General surgery:  [name] HPI: Patient is a 61 year old gentleman who underwent colonoscopy by [name] and was found to have a 3-4 cm sessile mass the distal transverse colon. 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 He was seen and evaluated by [name] and underwent laparoscopic-assisted mobilization splenic flexure, right hemicolectomy with primary anastomosis, 02/06/12.  Postop had acute renal failure with postoperative hypovolemia, which resolved. He also had a postop ileus. He was discharged on 02/13/12. He had some difficulty getting out of bed before discharge became worse on Friday, 02/14/12. He was unable to eat more than a few bites. Saturday 02/15/12, he had increasing abdominal pain ongoing nausea some vomiting again he was unable to eat. The family reports he's been since 4 AM with severe abdominal pain, and  reached the point where he could not take it any longer EMS was called and was transported to the ER at [facility]. On arrival to ER he was unable to even lift his legs to get on the gurney for examination. Films show a large amount of pneumoperitoneum felt to be extremely large even having had surgery several days. There was a normally dilated loops of small bowel with scattered air-fluid levels bowel loops measuring up to 5 cm in diameter. Concerning for obstruction and possible perforation. Labs show normal white count creatinine was up to 2.26 CO2 is 15. EKG shows some inferior changes, but nonspecific. He was seen in ER by [name] and is enroute to the OR now for exploratory laparotomy.      Past Medical History   Diagnosis  Date   .  Hyperlipidemia     .  Hypertension     .  Heart murmur     .  Diabetes mellitus         type 2 IDDM x 15 years   .  Sleep  apnea         does not wear CPAP now   .  Colon polyp     .  Arthritis         left knee   .  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  12/25/2011       Past Surgical History   Procedure  Date   .  Colonoscopy     .  Ganglion cyst excision  20 YRS AGO       RT ARM   .  Partial colectomy  02/06/2012   .  Partial colectomy  02/06/2012       Procedure: PARTIAL COLECTOMY;  Surgeon: Rodney Burn. [name], MD;  Location: [place];  Service: General;  Laterality: N/A;  right partial colectomy   .  Colonoscopy  02/06/2012       Procedure: COLONOSCOPY;  Surgeon: Rodney Banister, MD;  Location: [facility] OR;  Service: Endoscopy;;       Family History   Problem  Relation  Age of Onset   .  Diabetes  Father     .  Heart disease  Father     .  Colon cancer  Neg Hx     .  Diabetes  Sister     .  Heart disease  Sister     .  Heart disease  Mother  Social History: reports that he has been smoking Cigarettes.  He has a 15 pack-year smoking history. He has never used smokeless tobacco. He reports that he drinks alcohol. He reports that he does not use illicit drugs.   Allergies: No Known Allergies   (Not in a hospital admission)    Results for orders placed during the hospital encounter of 02/16/12 (from the past 48 hour(s))   CBC     Status: Abnormal     Collection Time     02/16/12 12:36 PM       Component  Value  Range  Comment     WBC  9.2   4.0 - 10.5 K/uL       RBC  4.20 (*)  4.22 - 5.81 MIL/uL       Hemoglobin  10.1 (*)  13.0 - 17.0 g/dL       HCT  28.7 (*)  39.0 - 52.0 %       MCV  68.3 (*)  78.0 - 100.0 fL       MCH  24.0 (*)  26.0 - 34.0 pg       MCHC  35.2   30.0 - 36.0 g/dL       RDW  16.7 (*)  11.5 - 15.5 %       Platelets  578 (*)  150 - 400 K/uL     BASIC METABOLIC PANEL     Status: Abnormal     Collection Time     02/16/12 12:36 PM       Component  Value  Range  Comment     Sodium  135   135 - 145 mEq/L       Potassium  4.4   3.5 - 5.1 mEq/L       Chloride  105   96 - 112  mEq/L       CO2  15 (*)  19 - 32 mEq/L       Glucose, Bld  127 (*)  70 - 99 mg/dL       BUN  30 (*)  6 - 23 mg/dL       Creatinine, Ser  2.26 (*)  0.50 - 1.35 mg/dL       Calcium  8.7   8.4 - 10.5 mg/dL       GFR calc non Af Amer  30 (*)  >90 mL/min       GFR calc Af Amer  35 (*)  >90 mL/min     HEPATIC FUNCTION PANEL     Status: Abnormal     Collection Time     02/16/12 12:58 PM       Component  Value  Range  Comment     Total Protein  6.8   6.0 - 8.3 g/dL       Albumin  2.6 (*)  3.5 - 5.2 g/dL       AST  32   0 - 37 U/L       ALT  25   0 - 53 U/L       Alkaline Phosphatase  66   39 - 117 U/L       Total Bilirubin  1.1   0.3 - 1.2 mg/dL       Bilirubin, Direct  0.5 (*)  0.0 - 0.3 mg/dL       Indirect Bilirubin  0.6   0.3 - 0.9 mg/dL     URINALYSIS, ROUTINE W REFLEX  MICROSCOPIC     Status: Abnormal     Collection Time     02/16/12  1:21 PM       Component  Value  Range  Comment     Color, Urine  ORANGE (*)  YELLOW  BIOCHEMICALS MAY BE AFFECTED BY COLOR     APPearance  CLOUDY (*)  CLEAR       Specific Gravity, Urine  1.026   1.005 - 1.030       pH  5.0   5.0 - 8.0       Glucose, UA  NEGATIVE   NEGATIVE mg/dL       Hgb urine dipstick  NEGATIVE   NEGATIVE       Bilirubin Urine  SMALL (*)  NEGATIVE       Ketones, ur  15 (*)  NEGATIVE mg/dL       Protein, ur  30 (*)  NEGATIVE mg/dL       Urobilinogen, UA  1.0   0.0 - 1.0 mg/dL       Nitrite  POSITIVE (*)  NEGATIVE       Leukocytes, UA  SMALL (*)  NEGATIVE     URINE MICROSCOPIC-ADD ON     Status: Abnormal     Collection Time     02/16/12  1:21 PM       Component  Value  Range  Comment     WBC, UA  0-2   <3 WBC/hpf       RBC / HPF  0-2   <3 RBC/hpf       Bacteria, UA  FEW (*)  RARE       Casts  HYALINE CASTS (*)  NEGATIVE       Urine-Other  MUCOUS PRESENT     AMORPHOUS URATES/PHOSPHATES    Dg Abd Acute W/chest   02/16/2012  *RADIOLOGY REPORT*  Clinical Data: Abdominal pain and bloating.  Partial colectomy on 02/06/2012.   Hypertension.  Diabetes.  ACUTE ABDOMEN SERIES (ABDOMEN 2 VIEW & CHEST 1 VIEW)  Comparison: 02/11/2012  Findings: Large amount of pneumoperitoneum.  Given the volume, this is abnormal despite the patient having had surgery 10 days ago. Also, the prior pneumoperitoneum has resolved back on 02/11/2012, so this represents new pneumoperitoneum.  The appearance is compatible with a perforated viscus.  Abnormal dilated loops of small bowel noted with scattered air- fluid levels.  Small bowel loops measure up to 5 cm in diameter. Probable obstruction favored over ileus.  Skin clips noted.  Low lung volumes are present on the chest radiograph, with subsegmental atelectasis in the lung bases.  IMPRESSION:  1.  Prominent (and new from 02/11/2012) pneumoperitoneum.  At 10 days out from abdominal surgery, and this amount of pneumoperitoneum is indicative of a perforated viscus.  Emergent surgical consultation recommended. 2.  Bibasilar subsegmental atelectasis. 3.  Air fluid levels in dilated small bowel favoring small bowel obstruction over ileus.  Critical Value/emergent results were called by telephone to Dr. Verl Dicker (who verbally acknowledged these results) at the time of interpretation on 02/16/2012 at 2:08 p.m.   Original Report Authenticated By: Carron Curie, M.D.      Review of Systems  Constitutional: Negative for fever and weight loss.  HENT: Negative.   Eyes: Negative.   Respiratory: Negative.   Cardiovascular: Negative.   Gastrointestinal: Positive for nausea, vomiting and abdominal pain. Negative for diarrhea and constipation.  Genitourinary: Negative.   Musculoskeletal: Positive for back pain.  Skin:  Negative.   Neurological: Negative.  Negative for weakness.  Endo/Heme/Allergies: Negative.   Psychiatric/Behavioral: Negative.     Blood pressure 80/47, pulse 80, temperature 98.6 F (37 C), temperature source Oral, resp. rate 22, SpO2 97.00%. Physical Exam  Constitutional:        Obese AAM, sedated, but having increasing abdominal pain since discharge 02/13/12.  Unable to eat.  BP has gone from 100/60 down into 80/50 range, tachycardic now up in the 90s  HENT:   Head: Normocephalic and atraumatic.   Nose: Nose normal.  Eyes: Right eye exhibits no discharge. Left eye exhibits no discharge. No scleral icterus.       Pupils pinpoint, after dilaudid  Neck: Normal range of motion. Neck supple. No JVD present. No tracheal deviation present. No thyromegaly present.  Cardiovascular: Normal rate, regular rhythm, normal heart sounds and intact distal pulses.    No murmur heard.      Sinus tachycardia  Respiratory: No stridor. No respiratory distress. He has no wheezes. He has no rales. He exhibits no tenderness.  GI: Soft. He exhibits distension. He exhibits no mass. There is tenderness. There is no rebound and no guarding.       When he arrived in ER, he could not even raise his leg off floor because pain was so severe, now just pointing to lower abd Midline abd incision intact, abd distended, pt is very overweight  Musculoskeletal: He exhibits no edema and no tenderness.  Lymphadenopathy:    He has no cervical adenopathy.  Neurological: No cranial nerve deficit.       Sedated falling asleep while I talk to him after dilaudid 1 mg.  Skin: Skin is warm and dry. No rash noted. No erythema. No pallor.  Psychiatric:       Sedated, has been up with pain since 4AM.    Assessment/Plan 1. Peritoneum status post laparoscopic-assisted right hemicolectomy with primary anastomosis 02/06/12 Dr. Georgette [name] 2.   Kelsea Mousel 02/16/2012, 3:13 PM         Revision History...     Date/Time User Action   02/16/2012 3:48 PM Rodney Jarred, MD Sign   02/16/2012 3:48 PM Earnstine Regal, PA Pend  View Details Report   Routing History...      [name] is an 61 y.o. male.  Chief Complaint: Abdominal pain, unable to eat, some nausea and vomiting.  General surgery: [name]  HPI: Patient is a 61 year old gentleman who underwent colonoscopy by [name] and was found to have a 3-4 cm sessile mass the distal transverse colon. 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 He was seen and evaluated by [name] and underwent laparoscopic-assisted mobilization splenic flexure, right hemicolectomy with primary anastomosis, 02/06/12. Postop had acute renal failure with postoperative hypovolemia, which resolved. He also had a postop ileus. He was discharged on 02/13/12. He had some difficulty getting out of bed before discharge became worse on Friday, 02/14/12. He was unable to eat more than a few bites. Saturday 02/15/12, he had increasing abdominal pain ongoing nausea some vomiting again he was unable to eat. The family reports he's been up since 4 AM with severe abdominal pain, and reached the point where he could not take it any longer. EMS was called and was transported to the ER at [facility]. On arrival to ER he was unable to even lift his legs to get on the gurney for examination. Films show a large amount of pneumoperitoneum felt to  be extremely large even having had surgery several days. There was a normally dilated loops of small bowel with scattered air-fluid levels bowel loops measuring up to 5 cm in diameter. Concerning for obstruction and possible perforation. Labs show normal white count creatinine was up to 2.26 CO2 is 15. EKG shows some inferior changes, but nonspecific. He was seen in ER by [name] and is enroute to the OR now for exploratory laparotomy.      Past Medical History   Diagnosis  Date   .  Hyperlipidemia    .  Hypertension    .  Heart murmur    .  Diabetes mellitus      type 2 IDDM x 15 years   .  Sleep apnea      does not wear CPAP now   .  Colon polyp    .  Arthritis      left knee   .  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  12/25/2011    Past Surgical History   Procedure  Date   .  Colonoscopy     .  Ganglion cyst excision  20 YRS AGO     RT ARM   .  Partial colectomy  02/06/2012   .  Partial colectomy  02/06/2012     Procedure: PARTIAL COLECTOMY; Surgeon: Rodney Burn. [name], MD; Location: [place]; Service: General; Laterality: N/A; right partial colectomy   .  Colonoscopy  02/06/2012     Procedure: COLONOSCOPY; Surgeon: Rodney Banister, MD; Location: [facility] OR; Service: Endoscopy;;    Family History   Problem  Relation  Age of Onset   .  Diabetes  Father    .  Heart disease  Father    .  Colon cancer  Neg Hx    .  Diabetes  Sister    .  Heart disease  Sister    .  Heart disease  Mother     Social History: reports that he has been smoking Cigarettes. He has a 15 pack-year smoking history. He has never used smokeless tobacco. He reports that he drinks alcohol. He reports that he does not use illicit drugs.  Allergies: No Known Allergies   (Not in a hospital admission)  Results for orders placed during the hospital encounter of 02/16/12 (from the past 48 hour(s))   CBC Status: Abnormal    Collection Time    02/16/12 12:36 PM   Component  Value  Range  Comment    WBC  9.2  4.0 - 10.5 K/uL     RBC  4.20 (*)  4.22 - 5.81 MIL/uL     Hemoglobin  10.1 (*)  13.0 - 17.0 g/dL     HCT  28.7 (*)  39.0 - 52.0 %     MCV  68.3 (*)  78.0 - 100.0 fL     MCH  24.0 (*)  26.0 - 34.0 pg     MCHC  35.2  30.0 - 36.0 g/dL     RDW  16.7 (*)  11.5 - 15.5 %     Platelets  578 (*)  150 - 400 K/uL    BASIC METABOLIC PANEL Status: Abnormal    Collection Time    02/16/12 12:36 PM   Component  Value  Range  Comment    Sodium  135  135 - 145 mEq/L     Potassium  4.4  3.5 - 5.1 mEq/L     Chloride  105  96 - 112 mEq/L     CO2  15 (*)  19 - 32 mEq/L     Glucose, Bld  127 (*)  70 - 99 mg/dL     BUN  30 (*)  6 - 23 mg/dL     Creatinine, Ser  2.26 (*)  0.50 - 1.35 mg/dL     Calcium  8.7  8.4 - 10.5 mg/dL     GFR calc non Af Amer  30 (*)  >90 mL/min     GFR calc Af Amer  35 (*)  >90 mL/min    HEPATIC FUNCTION  PANEL Status: Abnormal    Collection Time    02/16/12 12:58 PM   Component  Value  Range  Comment    Total Protein  6.8  6.0 - 8.3 g/dL     Albumin  2.6 (*)  3.5 - 5.2 g/dL     AST  32  0 - 37 U/L     ALT  25  0 - 53 U/L     Alkaline Phosphatase  66  39 - 117 U/L     Total Bilirubin  1.1  0.3 - 1.2 mg/dL     Bilirubin, Direct  0.5 (*)  0.0 - 0.3 mg/dL     Indirect Bilirubin  0.6  0.3 - 0.9 mg/dL    URINALYSIS, ROUTINE W REFLEX MICROSCOPIC Status: Abnormal    Collection Time    02/16/12 1:21 PM   Component  Value  Range  Comment    Color, Urine  ORANGE (*)  YELLOW  BIOCHEMICALS MAY BE AFFECTED BY COLOR    APPearance  CLOUDY (*)  CLEAR     Specific Gravity, Urine  1.026  1.005 - 1.030     pH  5.0  5.0 - 8.0     Glucose, UA  NEGATIVE  NEGATIVE mg/dL     Hgb urine dipstick  NEGATIVE  NEGATIVE     Bilirubin Urine  SMALL (*)  NEGATIVE     Ketones, ur  15 (*)  NEGATIVE mg/dL     Protein, ur  30 (*)  NEGATIVE mg/dL     Urobilinogen, UA  1.0  0.0 - 1.0 mg/dL     Nitrite  POSITIVE (*)  NEGATIVE     Leukocytes, UA  SMALL (*)  NEGATIVE    URINE MICROSCOPIC-ADD ON Status: Abnormal    Collection Time    02/16/12 1:21 PM   Component  Value  Range  Comment    WBC, UA  0-2  <3 WBC/hpf     RBC / HPF  0-2  <3 RBC/hpf     Bacteria, UA  FEW (*)  RARE     Casts  HYALINE CASTS (*)  NEGATIVE     Urine-Other  MUCOUS PRESENT   AMORPHOUS URATES/PHOSPHATES    Dg Abd Acute W/chest  02/16/2012 *RADIOLOGY REPORT* Clinical Data: Abdominal pain and bloating. Partial colectomy on 02/06/2012. Hypertension. Diabetes. ACUTE ABDOMEN SERIES (ABDOMEN 2 VIEW & CHEST 1 VIEW) Comparison: 02/11/2012 Findings: Large amount of pneumoperitoneum. Given the volume, this is abnormal despite the patient having had surgery 10 days ago. Also, the prior pneumoperitoneum has resolved back on 02/11/2012, so this represents new pneumoperitoneum. The appearance is compatible with a perforated viscus. Abnormal dilated loops of small bowel  noted with scattered air- fluid levels. Small bowel loops measure up to 5 cm in diameter. Probable obstruction favored over ileus. Skin clips noted. Low lung volumes are present on the  chest radiograph, with subsegmental atelectasis in the lung bases. IMPRESSION: 1. Prominent (and new from 02/11/2012) pneumoperitoneum. At 10 days out from abdominal surgery, and this amount of pneumoperitoneum is indicative of a perforated viscus. Emergent surgical consultation recommended. 2. Bibasilar subsegmental atelectasis. 3. Air fluid levels in dilated small bowel favoring small bowel obstruction over ileus. Critical Value/emergent results were called by telephone to Dr. Verl Dicker (who verbally acknowledged these results) at the time of interpretation on 02/16/2012 at 2:08 p.m. Original Report Authenticated By: Carron Curie, M.D.   Review of Systems  Constitutional: Negative for fever and weight loss.  HENT: Negative.  Eyes: Negative.  Respiratory: Negative.  Cardiovascular: Negative.  Gastrointestinal: Positive for nausea, vomiting and abdominal pain. Negative for diarrhea and constipation.  Genitourinary: Negative.  Musculoskeletal: Positive for back pain.  Skin: Negative.  Neurological: Negative. Negative for weakness.  Endo/Heme/Allergies: Negative.  Psychiatric/Behavioral: Negative.   Blood pressure 80/47, pulse 80, temperature 98.6 F (37 C), temperature source Oral, resp. rate 22, SpO2 97.00%.  Physical Exam  Constitutional:  Obese AAM, sedated, but having increasing abdominal pain since discharge 02/13/12. Unable to eat.  BP has gone from 100/60 down into 80/50 range, tachycardic now up in the 90s  HENT:  Head: Normocephalic and atraumatic.  Nose: Nose normal.  Eyes: Right eye exhibits no discharge. Left eye exhibits no discharge. No scleral icterus.  Pupils pinpoint, after dilaudid  Neck: Normal range of motion. Neck supple. No JVD present. No tracheal deviation present. No  thyromegaly present.  Cardiovascular: Normal rate, regular rhythm, normal heart sounds and intact distal pulses.  No murmur heard. Sinus tachycardia  Respiratory: No stridor. No respiratory distress. He has no wheezes. He has no rales. He exhibits no tenderness.  GI: Soft. He exhibits distension. He exhibits no mass. There is tenderness. There is no rebound and no guarding.  When he arrived in ER, he could not even raise his leg off floor because pain was so severe, now just pointing to lower abd Midline abd incision intact, abd distended, pt is very overweight  Musculoskeletal: He exhibits no edema and no tenderness.  Lymphadenopathy:  He has no cervical adenopathy.  Neurological: No cranial nerve deficit.  Sedated falling asleep while I talk to him after dilaudid 1 mg.  Skin: Skin is warm and dry. No rash noted. No erythema. No pallor.  Psychiatric:  Sedated, has been up with pain since 4AM.   Assessment/Plan  1. Peritoneum status post laparoscopic-assisted right hemicolectomy with primary anastomosis 02/06/12 Dr. Georgette [name]  2. Invasive Adenocarcinoma, Well differentiated. 3. Type 2 diabetes insulin-dependent 4. Coronary artery disease 5. Sleep apnea 6. Obesity 7. Hypertension 8. Hyperlipidemia  Plan: Patient is acutely ill; pneumoperitoneum, hypotension, dehydration and acute renal insufficiency. We plan to the patient directly to the OR. Troponins are pending he does have a minor EKG changes which we will follow. He has been seen and evaluated by Dr. Georganna Skeans and is in route to the [place]  02/16/2012, 3:13 PM

## 2012-02-16 NOTE — Progress Notes (Signed)
RR increased to 18 per ABG/MD order. No other changes at this time. RT will continue to monitor.

## 2012-02-16 NOTE — Preoperative (Signed)
Beta Blockers   Reason not to administer Beta Blockers:Hold beta blocker due to hypotension, Hold beta blocker due to hypovolemia 

## 2012-02-16 NOTE — Op Note (Signed)
02/16/2012  5:37 PM  PATIENT:  [name]  61 y.o. male  PRE-OPERATIVE DIAGNOSIS:  Perforated viscus  POST-OPERATIVE DIAGNOSIS:  Anastomatic leak  PROCEDURE:  Procedure(s): EXPLORATORY LAPAROTOMY RESECTION ILEOCOLONIC ANASTAMOSIS ILEOSTOMY APPLICATION OF WOUND VAC  SURGEON:  Georganna Skeans, MD  PHYSICIAN ASSISTANT:   ASSISTANTS:  Kaylyn Lim, MD  ANESTHESIA:   general  EBL:  Total I/O In: [id number] [I.V.:1000; Blood:350; IV Piggyback:500] Out: 150 [Blood:150]  BLOOD ADMINISTERED:2u CC PRBC  DRAINS: none   SPECIMEN:  Excision  DISPOSITION OF SPECIMEN:  PATHOLOGY  COUNTS:  YES  DICTATION: .Dragon Dictation  Patient is status post right colectomy for colon cancer on September 5. He presented to the emergency department with acute onset of severe abdominal pain, hypertension, and free intraperitoneal air. He is brought emergently to the operating room for exploration. He received intravenous antibiotics. Informed consent was obtained. He was identified in the preop holding area. He was brought to the operating room and general endotracheal anesthesia was a Company secretary by the anesthesia staff. His abdomen was prepped and draped in sterile fashion. We did time out procedure. His old staples were removed. There was spontaneous drainage of purulent succus material from the wound. Hold fascial sutures were removed. Abdomen was gradually entered. There were a lot of adhesions. These were freed up gently. There was a large volume of enteric contents in the abdomen. This was suctioned out. The anastomosis was identified in the right upper quadrant. There was a hole at the common defect staple line. Due to the large volume of contamination and the patient's hemodynamic instability decision was made to resect the anastomosis and bring out an ileostomy. Distal ileum was divided with GIA 75. Transverse colon just past the anastomosis was divided with a GIA-75. Mesentery was taken down by a  ccommodation of LigaSure and clamps with suture ligatures of 2-0 silk. Excellent hemostasis was obtained. The staple ends of bowel were viable. The abdomen was then further explored and all loculations and adhesions were freed up. A 9 L of warm saline were used to wash out the abdomen. Bowel was returned to anatomic position. Circular incision was made in the right lower quadrant and this was dissected down to the fascia. Cruciate incision was made in the fascia while holding the open fascia medially with a Coker. Ileostomy was brought out through this site and tacked to the fascia and the outside with 2-0 silk. Further irrigation fluid was evacuated from the abdomen. Due to patient's very large contamination and hemodynamic instability, decision was made to temporarily close the abdomen with open abdomen VAC device this was applied in standard fashion. It was trimmed to facilitate placement around the ileostomy. The fenestrated followed by 2 blue sponges was placed. The VAC drapes were applied and it was hooked up to suction. It held well. The ostomy was then matured with interrupted 3-0 Vicryl's. Good hemostasis was obtained. I was able to partially [place] the maturation. This completed the procedure. All counts were correct. Patient remained intubated and was taken directly to the intensive care unit. We will consult critical care medicine to assist in his management. There were no apparent intraoperative complications. His condition remains critical.  PATIENT DISPOSITION:  ICU - intubated and critically ill.   Delay start of Pharmacological VTE agent (>24hrs) due to surgical blood loss or risk of bleeding:  not applicable  Georganna Skeans, MD, MPH, FACS Pager: [phone]  9/15/20135:[id number] PM

## 2012-02-16 NOTE — ED Provider Notes (Signed)
History     CSN: [id number]  Arrival date & time 02/16/12  1143   First MD Initiated Contact with Patient 02/16/12 1230      Chief Complaint  Patient presents with  . Abdominal Pain    (Consider location/radiation/quality/duration/timing/severity/associated sxs/prior treatment) HPI Comments: Patient is a 61 year old male with a recent right hemicolectomy performed on September 5 by Dr. [name] & a history of hyperlipidemia hypertension and diabetes that presents emergency department with chief complaint of abdominal pain.  Onset of of abdominal pain began acutely this morning around 6 or 7 a.m., located in the right abdomen, described as a constant sharp pain that is associated with decreased urine output.  Pain is worsened with movement and palpation.  Pain is not relieved by OxyContin.  Patient denies any fever, night sweats, chills, constipation (last normal bowel movement was earlier this morning), nausea, vomiting, chest pain, shortness of breath, cough or hemoptysis.  Patient is a 61 y.o. male presenting with abdominal pain. The history is provided by the patient.  Abdominal Pain The primary symptoms of the illness include abdominal pain. The primary symptoms of the illness do not include fever, shortness of breath, nausea, vomiting or dysuria.  Symptoms associated with the illness do not include chills, constipation, urgency or frequency.    Past Medical History  Diagnosis Date  . Hyperlipidemia   . Hypertension   . Heart murmur   . Diabetes mellitus     type 2 IDDM x 15 years  . Sleep apnea     does not wear CPAP now  . Colon polyp   . Arthritis     left knee  . 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 12/25/2011    Past Surgical History  Procedure Date  . Colonoscopy   . Ganglion cyst excision 20 YRS AGO    RT ARM  . Partial colectomy 02/06/2012  . Partial colectomy 02/06/2012    Procedure: PARTIAL COLECTOMY;  Surgeon: Imogene Burn. [name], MD;  Location: [place];   Service: General;  Laterality: N/A;  right partial colectomy  . Colonoscopy 02/06/2012    Procedure: COLONOSCOPY;  Surgeon: Milus Banister, MD;  Location: [facility] OR;  Service: Endoscopy;;    Family History  Problem Relation Age of Onset  . Diabetes Father   . Heart disease Father   . Colon cancer Neg Hx   . Diabetes Sister   . Heart disease Sister   . Heart disease Mother     History  Substance Use Topics  . Smoking status: Current Every Day Smoker -- 0.5 packs/day for 30 years    Types: Cigarettes  . Smokeless tobacco: Never Used   Comment: faxing referral to quitline when signed by patient  . Alcohol Use: Yes     LIQUOR OR WINE WEEKLY      Review of Systems  Constitutional: Negative for fever, chills and appetite change.  HENT: Negative for congestion and neck stiffness.   Eyes: Negative for visual disturbance.  Respiratory: Negative for shortness of breath.   Cardiovascular: Negative for chest pain and leg swelling.  Gastrointestinal: Positive for abdominal pain. Negative for nausea, vomiting, constipation and anal bleeding.  Genitourinary: Positive for difficulty urinating. Negative for dysuria, urgency, frequency, flank pain and testicular pain.  Skin: Negative for color change and rash.  Neurological: Negative for dizziness, syncope, weakness, light-headedness, numbness and headaches.  Psychiatric/Behavioral: Negative for confusion.  All other systems reviewed and are negative.    Allergies  Review of patient's  allergies indicates no known allergies.  Home Medications   Current Outpatient Rx  Name Route Sig Dispense Refill  . ATORVASTATIN CALCIUM 20 MG PO TABS Oral Take 20 mg by mouth at bedtime.    Marland Kitchen CARVEDILOL 6.25 MG PO TABS Oral Take 1 tablet (6.25 mg total) by mouth 2 (two) times daily with a meal. 60 tablet 0  . INSULIN ISOPHANE & REGULAR (70-30) 100 UNIT/ML [place] SUSP Subcutaneous Inject 10-30 Units into the skin 2 (two) times daily with a meal. Inject 30 units  in the morning and 10 units at bedtime.    Marland Kitchen METFORMIN HCL 500 MG PO TABS Oral Take 1,000 mg by mouth 2 (two) times daily with a meal.    . OXYCODONE-ACETAMINOPHEN 5-325 MG PO TABS Oral Take 1-2 tablets by mouth every 6 (six) hours as needed. 30 tablet 0    BP 80/47  Pulse 80  Temp 98.6 F (37 C) (Oral)  Resp 22  SpO2 97%  Physical Exam  Constitutional: He is oriented to person, place, and time. He appears well-developed and well-nourished. He appears distressed.  HENT:  Head: Normocephalic and atraumatic.  Mouth/Throat: Oropharynx is clear and moist. No oropharyngeal exudate.  Eyes: Conjunctivae normal and EOM are normal. Pupils are equal, round, and reactive to light. No scleral icterus.  Neck: Normal range of motion. Neck supple. No tracheal deviation present. No thyromegaly present.  Cardiovascular: Normal rate, regular rhythm, normal heart sounds and intact distal pulses.   Pulmonary/Chest: Effort normal and breath sounds normal. No stridor. No respiratory distress. He has no wheezes.  Abdominal: Soft.       Abdomen with midline surgical scar & staples. Appears distended, bowel sounds present. TTP along entire right abdomen.   Musculoskeletal: Normal range of motion. He exhibits no edema and no tenderness.  Neurological: He is alert and oriented to person, place, and time. Coordination normal.  Skin: Skin is warm and dry. No rash noted. He is not diaphoretic. No erythema. No pallor.  Psychiatric: He has a normal mood and affect. His behavior is normal.    ED Course  Procedures (including critical care time)  Labs Reviewed  CBC - Abnormal; Notable for the following:    RBC 4.20 (*)     Hemoglobin 10.1 (*)     HCT 28.7 (*)     MCV 68.3 (*)     MCH 24.0 (*)     RDW 16.7 (*)     Platelets 578 (*)     All other components within normal limits  BASIC METABOLIC PANEL - Abnormal; Notable for the following:    CO2 15 (*)     Glucose, Bld 127 (*)     BUN 30 (*)      Creatinine, Ser 2.26 (*)     GFR calc non Af Amer 30 (*)     GFR calc Af Amer 35 (*)     All other components within normal limits  URINALYSIS, ROUTINE W REFLEX MICROSCOPIC - Abnormal; Notable for the following:    Color, Urine ORANGE (*)  BIOCHEMICALS MAY BE AFFECTED BY COLOR   APPearance CLOUDY (*)     Bilirubin Urine SMALL (*)     Ketones, ur 15 (*)     Protein, ur 30 (*)     Nitrite POSITIVE (*)     Leukocytes, UA SMALL (*)     All other components within normal limits  HEPATIC FUNCTION PANEL - Abnormal; Notable for the following:  Albumin 2.6 (*)     Bilirubin, Direct 0.5 (*)     All other components within normal limits  URINE MICROSCOPIC-ADD ON - Abnormal; Notable for the following:    Bacteria, UA FEW (*)     Casts HYALINE CASTS (*)     All other components within normal limits   Dg Abd Acute W/chest  02/16/2012  *RADIOLOGY REPORT*  Clinical Data: Abdominal pain and bloating.  Partial colectomy on 02/06/2012.  Hypertension.  Diabetes.  ACUTE ABDOMEN SERIES (ABDOMEN 2 VIEW & CHEST 1 VIEW)  Comparison: 02/11/2012  Findings: Large amount of pneumoperitoneum.  Given the volume, this is abnormal despite the patient having had surgery 10 days ago. Also, the prior pneumoperitoneum has resolved back on 02/11/2012, so this represents new pneumoperitoneum.  The appearance is compatible with a perforated viscus.  Abnormal dilated loops of small bowel noted with scattered air- fluid levels.  Small bowel loops measure up to 5 cm in diameter. Probable obstruction favored over ileus.  Skin clips noted.  Low lung volumes are present on the chest radiograph, with subsegmental atelectasis in the lung bases.  IMPRESSION:  1.  Prominent (and new from 02/11/2012) pneumoperitoneum.  At 10 days out from abdominal surgery, and this amount of pneumoperitoneum is indicative of a perforated viscus.  Emergent surgical consultation recommended. 2.  Bibasilar subsegmental atelectasis. 3.  Air fluid levels in  dilated small bowel favoring small bowel obstruction over ileus.  Critical Value/emergent results were called by telephone to Dr. Verl Dicker (who verbally acknowledged these results) at the time of interpretation on 02/16/2012 at 2:08 p.m.   Original Report Authenticated By: Carron Curie, M.D.    CRITICAL CARE Performed by: Verl Dicker   Total critical care time: 30  Critical care time was exclusive of separately billable procedures and treating other patients.  Critical care was necessary to treat or prevent imminent or life-threatening deterioration.  Critical care was time spent personally by me on the following activities: development of treatment plan with patient and/or surrogate as well as nursing, discussions with consultants, evaluation of patient's response to treatment, examination of patient, obtaining history from patient or surrogate, ordering and performing treatments and interventions, ordering and review of laboratory studies, ordering and review of radiographic studies, pulse oximetry and re-evaluation of patient's condition.   No diagnosis found.  1:30 PM Pt re-assessed and appears groggy after pain medication given (dilauded). Pt is easily aroused and reports that he's tired because he hasn't been sleeping. His pain has improved.    2:21 PM  Consult General Surgery: Spoke with nurse of on call surgeon who is currently in [place]. Pt will be kept NPO pending likely surgery.    3:00 PM  Nurse alerted me of pts blood pressure being low. Fluid bolus ordered.  MDM  Free air in abdomen likely perforated viscous  ARF UTI  60 yo male presents with acute onset of right sided abdominal pain 10 days s/p right hemicolectomy (Dr. [name]). Pt to be admitted for surgery. In addition patient was found to be in ARF and a bolus of fluid has been given. UTI found as well, treated with rocephin. Pt seen in ER by surgery PA and will be admitted for surgery. Pre- surgery labs ordered.  The patient appears reasonably stabilized for admission considering the current resources, flow, and capabilities available in the ED at this time, and I doubt any other [facility] requiring further screening and/or treatment in the ED prior to admission. Pt seen  with Dr. Monia Pouch who agrees with above plan         Verl Dicker, [place] 02/16/12 [id number]

## 2012-02-16 NOTE — ED Notes (Signed)
Patient transported to X-ray 

## 2012-02-16 NOTE — ED Provider Notes (Signed)
3:35 PM . Date: 02/16/2012  Rate: 76  Rhythm: normal sinus rhythm  QRS Axis: normal  Intervals: normal QRS:  Borderline Q waves in III, AVF  ST/T Wave abnormalities: nonspecific T wave changes  Conduction Disutrbances:none  Narrative Interpretation: Abnormal EKG  Old EKG Reviewed: changes noted--Q waves in II and AVF are new.    Mylinda Latina III, MD 02/16/12 1537

## 2012-02-16 NOTE — H&P (Signed)
Kellar A Hemple is an 61 y.o. male.   Chief Complaint: Abdominal pain, unable to eat, some nausea and vomiting. General surgery:  [name] HPI: Patient is a 61 year old gentleman who underwent colonoscopy by [name] and was found to have a 3-4 cm sessile mass the distal transverse colon. 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 He was seen and evaluated by Dr. [name] and underwent laparoscopic-assisted mobilization splenic flexure, right hemicolectomy with primary anastomosis, 02/06/12.  Postop had acute renal failure with postoperative hypovolemia, which resolved. He also had a postop ileus. He was discharged on 02/13/12. He had some difficulty getting out of bed before discharge became worse on Friday, 02/14/12. He was unable to eat more than a few bites. Saturday 02/15/12, he had increasing abdominal pain ongoing nausea some vomiting again he was unable to eat. The family reports he's been since 4 AM with severe abdominal pain, and  reached the point where he could not take it any longer EMS was called and was transported to the ER at [facility]. On arrival to ER he was unable to even lift his legs to get on the gurney for examination. Films show a large amount of pneumoperitoneum felt to be extremely large even having had surgery several days. There was a normally dilated loops of small bowel with scattered air-fluid levels bowel loops measuring up to 5 cm in diameter. Concerning for obstruction and possible perforation. Labs show normal white count creatinine was up to 2.26 CO2 is 15. EKG shows some inferior changes, but nonspecific. He was seen in ER by Dr. Grandville Silos and is enroute to the OR now for exploratory laparotomy.   Past Medical History  Diagnosis Date  . Hyperlipidemia   . Hypertension   . Heart murmur   . Diabetes mellitus     type 2 IDDM x 15 years  . Sleep apnea     does not wear CPAP now  . Colon polyp   . Arthritis     left knee  . Dysplastic  polyp of colon - proximal transverse 12/25/2011    Past Surgical History  Procedure Date  . Colonoscopy   . Ganglion cyst excision 20 YRS AGO    RT ARM  . Partial colectomy 02/06/2012  . Partial colectomy 02/06/2012    Procedure: PARTIAL COLECTOMY;  Surgeon: Imogene Burn. [name], MD;  Location: [place];  Service: General;  Laterality: N/A;  right partial colectomy  . Colonoscopy 02/06/2012    Procedure: COLONOSCOPY;  Surgeon: Milus Banister, MD;  Location: [facility] OR;  Service: Endoscopy;;    Family History  Problem Relation Age of Onset  . Diabetes Father   . Heart disease Father   . Colon cancer Neg Hx   . Diabetes Sister   . Heart disease Sister   . Heart disease Mother    Social History:  reports that he has been smoking Cigarettes.  He has a 15 pack-year smoking history. He has never used smokeless tobacco. He reports that he drinks alcohol. He reports that he does not use illicit drugs.  Allergies: No Known Allergies   (Not in a hospital admission)  Results for orders placed during the hospital encounter of 02/16/12 (from the past 48 hour(s))  CBC     Status: Abnormal   Collection Time   02/16/12 12:36 PM      Component Value Range Comment   WBC 9.2  4.0 - 10.5 K/uL    RBC 4.20 (*)  4.22 - 5.81 MIL/uL    Hemoglobin 10.1 (*) 13.0 - 17.0 g/dL    HCT 28.7 (*) 39.0 - 52.0 %    MCV 68.3 (*) 78.0 - 100.0 fL    MCH 24.0 (*) 26.0 - 34.0 pg    MCHC 35.2  30.0 - 36.0 g/dL    RDW 16.7 (*) 11.5 - 15.5 %    Platelets 578 (*) 150 - 400 K/uL   BASIC METABOLIC PANEL     Status: Abnormal   Collection Time   02/16/12 12:36 PM      Component Value Range Comment   Sodium 135  135 - 145 mEq/L    Potassium 4.4  3.5 - 5.1 mEq/L    Chloride 105  96 - 112 mEq/L    CO2 15 (*) 19 - 32 mEq/L    Glucose, Bld 127 (*) 70 - 99 mg/dL    BUN 30 (*) 6 - 23 mg/dL    Creatinine, Ser 2.26 (*) 0.50 - 1.35 mg/dL    Calcium 8.7  8.4 - 10.5 mg/dL    GFR calc non Af Amer 30 (*) >90 mL/min    GFR calc Af Amer 35  (*) >90 mL/min   HEPATIC FUNCTION PANEL     Status: Abnormal   Collection Time   02/16/12 12:58 PM      Component Value Range Comment   Total Protein 6.8  6.0 - 8.3 g/dL    Albumin 2.6 (*) 3.5 - 5.2 g/dL    AST 32  0 - 37 U/L    ALT 25  0 - 53 U/L    Alkaline Phosphatase 66  39 - 117 U/L    Total Bilirubin 1.1  0.3 - 1.2 mg/dL    Bilirubin, Direct 0.5 (*) 0.0 - 0.3 mg/dL    Indirect Bilirubin 0.6  0.3 - 0.9 mg/dL   URINALYSIS, ROUTINE W REFLEX MICROSCOPIC     Status: Abnormal   Collection Time   02/16/12  1:21 PM      Component Value Range Comment   Color, Urine ORANGE (*) YELLOW BIOCHEMICALS MAY BE AFFECTED BY COLOR   APPearance CLOUDY (*) CLEAR    Specific Gravity, Urine 1.026  1.005 - 1.030    pH 5.0  5.0 - 8.0    Glucose, UA NEGATIVE  NEGATIVE mg/dL    Hgb urine dipstick NEGATIVE  NEGATIVE    Bilirubin Urine SMALL (*) NEGATIVE    Ketones, ur 15 (*) NEGATIVE mg/dL    Protein, ur 30 (*) NEGATIVE mg/dL    Urobilinogen, UA 1.0  0.0 - 1.0 mg/dL    Nitrite POSITIVE (*) NEGATIVE    Leukocytes, UA SMALL (*) NEGATIVE   URINE MICROSCOPIC-ADD ON     Status: Abnormal   Collection Time   02/16/12  1:21 PM      Component Value Range Comment   WBC, UA 0-2  <3 WBC/hpf    RBC / HPF 0-2  <3 RBC/hpf    Bacteria, UA FEW (*) RARE    Casts HYALINE CASTS (*) NEGATIVE    Urine-Other MUCOUS PRESENT   AMORPHOUS URATES/PHOSPHATES   Dg Abd Acute W/chest  02/16/2012  *RADIOLOGY REPORT*  Clinical Data: Abdominal pain and bloating.  Partial colectomy on 02/06/2012.  Hypertension.  Diabetes.  ACUTE ABDOMEN SERIES (ABDOMEN 2 VIEW & CHEST 1 VIEW)  Comparison: 02/11/2012  Findings: Large amount of pneumoperitoneum.  Given the volume, this is abnormal despite the patient having had surgery 10 days ago. Also,  the prior pneumoperitoneum has resolved back on 02/11/2012, so this represents new pneumoperitoneum.  The appearance is compatible with a perforated viscus.  Abnormal dilated loops of small bowel noted with  scattered air- fluid levels.  Small bowel loops measure up to 5 cm in diameter. Probable obstruction favored over ileus.  Skin clips noted.  Low lung volumes are present on the chest radiograph, with subsegmental atelectasis in the lung bases.  IMPRESSION:  1.  Prominent (and new from 02/11/2012) pneumoperitoneum.  At 10 days out from abdominal surgery, and this amount of pneumoperitoneum is indicative of a perforated viscus.  Emergent surgical consultation recommended. 2.  Bibasilar subsegmental atelectasis. 3.  Air fluid levels in dilated small bowel favoring small bowel obstruction over ileus.  Critical Value/emergent results were called by telephone to Dr. Verl Dicker (who verbally acknowledged these results) at the time of interpretation on 02/16/2012 at 2:08 p.m.   Original Report Authenticated By: Carron Curie, M.D.     Review of Systems  Constitutional: Negative for fever and weight loss.  HENT: Negative.   Eyes: Negative.   Respiratory: Negative.   Cardiovascular: Negative.   Gastrointestinal: Positive for nausea, vomiting and abdominal pain. Negative for diarrhea and constipation.  Genitourinary: Negative.   Musculoskeletal: Positive for back pain.  Skin: Negative.   Neurological: Negative.  Negative for weakness.  Endo/Heme/Allergies: Negative.   Psychiatric/Behavioral: Negative.     Blood pressure 80/47, pulse 80, temperature 98.6 F (37 C), temperature source Oral, resp. rate 22, SpO2 97.00%. Physical Exam  Constitutional:       Obese AAM, sedated, but having increasing abdominal pain since discharge 02/13/12.  Unable to eat.  BP has gone from 100/60 down into 80/50 range, tachycardic now up in the 90s  HENT:  Head: Normocephalic and atraumatic.  Nose: Nose normal.  Eyes: Right eye exhibits no discharge. Left eye exhibits no discharge. No scleral icterus.       Pupils pinpoint, after dilaudid  Neck: Normal range of motion. Neck supple. No JVD present. No tracheal  deviation present. No thyromegaly present.  Cardiovascular: Normal rate, regular rhythm, normal heart sounds and intact distal pulses.   No murmur heard.      Sinus tachycardia  Respiratory: No stridor. No respiratory distress. He has no wheezes. He has no rales. He exhibits no tenderness.  GI: Soft. He exhibits distension. He exhibits no mass. There is tenderness. There is no rebound and no guarding.       When he arrived in ER, he could not even raise his leg off floor because pain was so severe, now just pointing to lower abd Midline abd incision intact, abd distended, pt is very overweight  Musculoskeletal: He exhibits no edema and no tenderness.  Lymphadenopathy:    He has no cervical adenopathy.  Neurological: No cranial nerve deficit.       Sedated falling asleep while I talk to him after dilaudid 1 mg.  Skin: Skin is warm and dry. No rash noted. No erythema. No pallor.  Psychiatric:       Sedated, has been up with pain since 4AM.     Assessment/Plan 1. Peritoneum status post laparoscopic-assisted right hemicolectomy with primary anastomosis 02/06/12 Dr. [name] 2.  JENNINGS,WILLARD 02/16/2012, 3:13 PM

## 2012-02-16 NOTE — Anesthesia Postprocedure Evaluation (Signed)
  Anesthesia Post-op Note  Patient: Elver A Art  Procedure(s) Performed: Procedure(s) (LRB) with comments: EXPLORATORY LAPAROTOMY (N/A) - Exploratory Laparotomy with resection of anastomosis ILEOSTOMY (N/A) APPLICATION OF WOUND VAC (N/A)  Patient Location: ICU  Anesthesia Type: General  Level of Consciousness: Patient remains intubated per anesthesia plan  Airway and Oxygen Therapy: Patient remains intubated per anesthesia plan and Patient placed on Ventilator (see vital sign flow sheet for setting)  Post-op Pain: none  Post-op Assessment: Post-op Vital signs reviewed, Patient's Cardiovascular Status Stable, Respiratory Function Stable, Patent Airway, No signs of Nausea or vomiting and Pain level controlled  Post-op Vital Signs: stable  Complications: No apparent anesthesia complications

## 2012-02-16 NOTE — Transfer of Care (Signed)
Immediate Anesthesia Transfer of Care Note  Patient: [name]  Procedure(s) Performed: Procedure(s) (LRB) with comments: EXPLORATORY LAPAROTOMY (N/A) - Exploratory Laparotomy with resection of anastomosis ILEOSTOMY (N/A) APPLICATION OF WOUND VAC (N/A)  Patient Location: SICU  Anesthesia Type: General  Level of Consciousness: sedated and unresponsive  Airway & Oxygen Therapy: Patient remains intubated per anesthesia plan and Patient placed on Ventilator (see vital sign flow sheet for setting)  Post-op Assessment: Report given to PACU RN and Post -op Vital signs reviewed and stable  Post vital signs: Reviewed and stable  Complications: No apparent anesthesia complications

## 2012-02-16 NOTE — Progress Notes (Signed)
Patient ID: [name], male   DOB: Oct 02, 1950, 61 y.o.   MRN: [id number] Appreciate CCM assist.  Volume resuscitation ongoing. Georganna Skeans, MD, MPH, FACS Pager: [phone]

## 2012-02-16 NOTE — Interval H&P Note (Signed)
History and Physical Interval Note: Patient with free air and abdominal pain 10d S/P lap assisted R colectomy.  Suspect anastamotic leak.  Will take emergently to the OR for exploration and likely ostomy.  Patient agrees.  I also spoke to his brother at the bedside.  02/16/2012 3:49 PM  Joshaua A Matheus  has presented today for surgery, with the diagnosis of Free air  The various methods of treatment have been discussed with the patient and family. After consideration of risks, benefits and other options for treatment, the patient has consented to  Procedure(s) (LRB) with comments: EXPLORATORY LAPAROTOMY (N/A) as a surgical intervention .  The patient's history has been reviewed, patient examined, no change in status, stable for surgery.  I have reviewed the patient's chart and labs.  Questions were answered to the patient's satisfaction.     Saki Legore E

## 2012-02-17 ENCOUNTER — Encounter (HOSPITAL_COMMUNITY): Payer: Self-pay

## 2012-02-17 ENCOUNTER — Telehealth: Payer: Self-pay | Admitting: Gastroenterology

## 2012-02-17 ENCOUNTER — Inpatient Hospital Stay (HOSPITAL_COMMUNITY): Payer: Medicaid Other

## 2012-02-17 DIAGNOSIS — A419 Sepsis, unspecified organism: Secondary | ICD-10-CM

## 2012-02-17 DIAGNOSIS — R6520 Severe sepsis without septic shock: Secondary | ICD-10-CM

## 2012-02-17 LAB — CBC
HCT: 27.9 % — ABNORMAL LOW (ref 39.0–52.0)
Hemoglobin: 9.9 g/dL — ABNORMAL LOW (ref 13.0–17.0)
MCH: 25.3 pg — ABNORMAL LOW (ref 26.0–34.0)
MCHC: 35.5 g/dL (ref 30.0–36.0)
MCV: 71.2 fL — ABNORMAL LOW (ref 78.0–100.0)
Platelets: 349 10*3/uL (ref 150–400)
RBC: 3.92 MIL/uL — ABNORMAL LOW (ref 4.22–5.81)
RDW: 18.6 % — ABNORMAL HIGH (ref 11.5–15.5)
WBC: 16.2 10*3/uL — ABNORMAL HIGH (ref 4.0–10.5)

## 2012-02-17 LAB — POCT I-STAT 3, ART BLOOD GAS (G3+)
Acid-base deficit: 9 mmol/L — ABNORMAL HIGH (ref 0.0–2.0)
Bicarbonate: 16.4 mEq/L — ABNORMAL LOW (ref 20.0–24.0)
O2 Saturation: 100 %
Patient temperature: 97.5
TCO2: 17 mmol/L (ref 0–100)
pCO2 arterial: 31 mmHg — ABNORMAL LOW (ref 35.0–45.0)
pH, Arterial: 7.328 — ABNORMAL LOW (ref 7.350–7.450)
pO2, Arterial: 193 mmHg — ABNORMAL HIGH (ref 80.0–100.0)

## 2012-02-17 LAB — GLUCOSE, CAPILLARY
Glucose-Capillary: 102 mg/dL — ABNORMAL HIGH (ref 70–99)
Glucose-Capillary: 104 mg/dL — ABNORMAL HIGH (ref 70–99)
Glucose-Capillary: 112 mg/dL — ABNORMAL HIGH (ref 70–99)
Glucose-Capillary: 81 mg/dL (ref 70–99)
Glucose-Capillary: 81 mg/dL (ref 70–99)
Glucose-Capillary: 89 mg/dL (ref 70–99)
Glucose-Capillary: 89 mg/dL (ref 70–99)

## 2012-02-17 LAB — TYPE AND SCREEN
ABO/RH(D): AB POS
Antibody Screen: NEGATIVE
Unit division: 0
Unit division: 0

## 2012-02-17 LAB — BASIC METABOLIC PANEL
BUN: 37 mg/dL — ABNORMAL HIGH (ref 6–23)
BUN: 42 mg/dL — ABNORMAL HIGH (ref 6–23)
CO2: 15 mEq/L — ABNORMAL LOW (ref 19–32)
CO2: 16 mEq/L — ABNORMAL LOW (ref 19–32)
Calcium: 6.5 mg/dL — ABNORMAL LOW (ref 8.4–10.5)
Calcium: 6.6 mg/dL — ABNORMAL LOW (ref 8.4–10.5)
Chloride: 112 mEq/L (ref 96–112)
Chloride: 113 mEq/L — ABNORMAL HIGH (ref 96–112)
Creatinine, Ser: 2.4 mg/dL — ABNORMAL HIGH (ref 0.50–1.35)
Creatinine, Ser: 2.28 mg/dL — ABNORMAL HIGH (ref 0.50–1.35)
GFR calc Af Amer: 32 mL/min — ABNORMAL LOW (ref >90)
GFR calc Af Amer: 34 mL/min — ABNORMAL LOW (ref >90)
GFR calc non Af Amer: 28 mL/min — ABNORMAL LOW (ref >90)
GFR calc non Af Amer: 29 mL/min — ABNORMAL LOW (ref >90)
Glucose, Bld: 110 mg/dL — ABNORMAL HIGH (ref 70–99)
Glucose, Bld: 117 mg/dL — ABNORMAL HIGH (ref 70–99)
Potassium: 5.2 mEq/L — ABNORMAL HIGH (ref 3.5–5.1)
Potassium: 4.7 mEq/L (ref 3.5–5.1)
Sodium: 138 mEq/L (ref 135–145)
Sodium: 139 mEq/L (ref 135–145)

## 2012-02-17 LAB — CARBOXYHEMOGLOBIN
Carboxyhemoglobin: 1 % (ref 0.5–1.5)
Methemoglobin: 1.6 % — ABNORMAL HIGH (ref 0.0–1.5)
O2 Saturation: 76 %
Total hemoglobin: 9.6 g/dL — ABNORMAL LOW (ref 13.5–18.0)

## 2012-02-17 LAB — LACTIC ACID, PLASMA: Lactic Acid, Venous: 1.3 mmol/L (ref 0.5–2.2)

## 2012-02-17 LAB — MAGNESIUM: Magnesium: 1.4 mg/dL — ABNORMAL LOW (ref 1.5–2.5)

## 2012-02-17 LAB — PHOSPHORUS: Phosphorus: 5.4 mg/dL — ABNORMAL HIGH (ref 2.3–4.6)

## 2012-02-17 IMAGING — CR DG CHEST 1V PORT
1 series · 1 of 1 positions shown · non-contrast
Comparison: [DATE].

CLINICAL DATA: Exploratory laparotomy.  Wound vac.  Perforated
viscus.

PORTABLE CHEST - 1 VIEW

[AP]
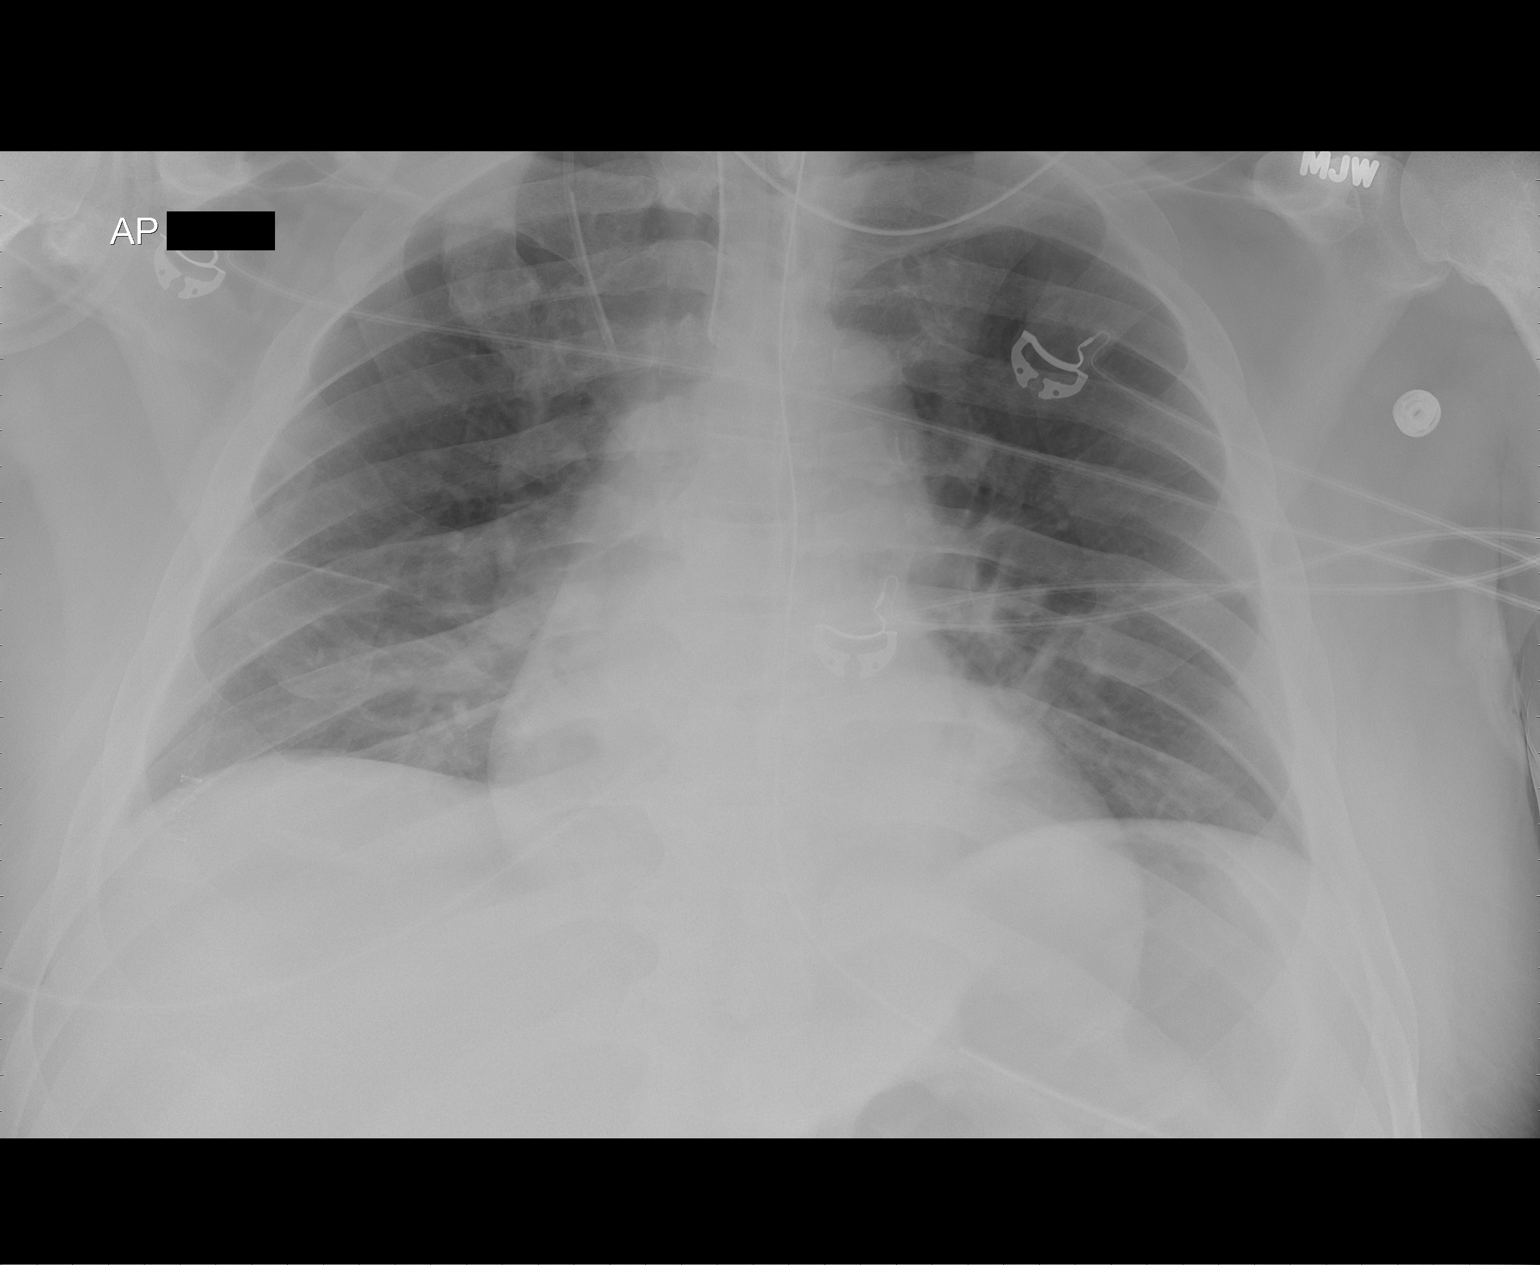

[1 of 1 positions shown; findings below may reference images not displayed]

FINDINGS: Endotracheal tube is unchanged.  The right IJ central
line has been adjusted and redundant loop in the right paratracheal
vascular structures is been reduced.  The tip of the right IJ
central line is just superior to the confluence of the
brachiocephalic veins.  Nasogastric tube is unchanged.  Low volumes
with basilar atelectasis.  No airspace disease.  Cardiopericardial
silhouette within normal limits for projection and inspiration.
IMPRESSION: 1.  Reduction of redundant loop from right IJ central line.
2.  Other support apparatus stable.
3.  Low volumes with basilar atelectasis.

## 2012-02-17 MED ORDER — MAGNESIUM SULFATE 50 % IJ SOLN: 1.0000 g | Freq: Once | INTRAVENOUS | Status: DC

## 2012-02-17 MED ORDER — SODIUM CHLORIDE 0.9 % IV BOLUS (SEPSIS)
500.0000 mL | INTRAVENOUS | Status: DC | PRN
  Administered 2012-02-18: 500 mL via INTRAVENOUS

## 2012-02-17 MED ORDER — NOREPINEPHRINE BITARTRATE 1 MG/ML IJ SOLN
2.0000 ug/min | INTRAVENOUS | Status: DC
  Administered 2012-02-17: 5 ug/min via INTRAVENOUS
  Administered 2012-02-17: 4 ug/min via INTRAVENOUS
  Administered 2012-02-18: 15 ug/min via INTRAVENOUS
  Filled 2012-02-17 (×3): qty 16

## 2012-02-17 MED ORDER — CHLORHEXIDINE GLUCONATE 0.12 % MT SOLN
15.0000 mL | Freq: Two times a day (BID) | OROMUCOSAL | Status: DC
  Administered 2012-02-17 – 2012-02-28 (×23): 15 mL via OROMUCOSAL
  Filled 2012-02-17 (×22): qty 15

## 2012-02-17 MED ORDER — MIDAZOLAM BOLUS VIA INFUSION
2.0000 mg | INTRAVENOUS | Status: DC | PRN
  Filled 2012-02-17: qty 6

## 2012-02-17 MED ORDER — SODIUM CHLORIDE 0.9 % IV SOLN
INTRAVENOUS | Status: AC
  Administered 2012-02-17: 03:00:00 via INTRAVENOUS

## 2012-02-17 MED ORDER — SODIUM CHLORIDE 0.9 % IV BOLUS (SEPSIS)
1000.0000 mL | Freq: Once | INTRAVENOUS | Status: AC
  Administered 2012-02-17: 1000 mL via INTRAVENOUS

## 2012-02-17 MED ORDER — ACETAMINOPHEN 10 MG/ML IV SOLN
1000.0000 mg | Freq: Four times a day (QID) | INTRAVENOUS | Status: AC
  Administered 2012-02-17 – 2012-02-18 (×4): 1000 mg via INTRAVENOUS
  Filled 2012-02-17 (×4): qty 100

## 2012-02-17 MED ORDER — MAGNESIUM SULFATE IN D5W 10-5 MG/ML-% IV SOLN
1.0000 g | Freq: Once | INTRAVENOUS | Status: AC
  Administered 2012-02-17: 1 g via INTRAVENOUS
  Filled 2012-02-17: qty 100

## 2012-02-17 MED ORDER — SODIUM CHLORIDE 0.9 % IJ SOLN
3.0000 mL | Freq: Two times a day (BID) | INTRAMUSCULAR | Status: DC
  Administered 2012-02-17 – 2012-02-20 (×6): 3 mL via INTRAVENOUS

## 2012-02-17 MED ORDER — DEXTROSE 5 % IV SOLN: 1.0000 g | Freq: Once | INTRAVENOUS | Status: DC

## 2012-02-17 MED ORDER — MIDAZOLAM HCL 2 MG/2ML IJ SOLN
2.0000 mg | INTRAMUSCULAR | Status: DC | PRN
  Administered 2012-02-17 (×4): 4 mg via INTRAVENOUS
  Administered 2012-02-17: 2 mg via INTRAVENOUS
  Administered 2012-02-17: 4 mg via INTRAVENOUS
  Administered 2012-02-18: 2 mg via INTRAVENOUS
  Administered 2012-02-18: 4 mg via INTRAVENOUS
  Administered 2012-02-18 (×2): 2 mg via INTRAVENOUS
  Administered 2012-02-18 (×3): 4 mg via INTRAVENOUS
  Filled 2012-02-17 (×4): qty 4
  Filled 2012-02-17: qty 2
  Filled 2012-02-17 (×3): qty 4
  Filled 2012-02-17: qty 2
  Filled 2012-02-17: qty 6
  Filled 2012-02-17 (×3): qty 2

## 2012-02-17 MED ORDER — SODIUM CHLORIDE 0.9 % IV SOLN
INTRAVENOUS | Status: DC
  Administered 2012-02-17 (×2): via INTRAVENOUS
  Filled 2012-02-17: qty 1000

## 2012-02-17 NOTE — Progress Notes (Signed)
eLink Physician-Brief Progress Note Patient Name: DEVONTEA BONNO DOB: 02/09/1951 MRN: [id number]  Date of Service  02/17/2012   HPI/Events of Note   Persistent hypotension.  eICU Interventions  Levophed started.      YACOUB,WESAM 02/17/2012, 3:11 AM

## 2012-02-17 NOTE — Progress Notes (Signed)
20 cc of versed drip wasted into sink, witnessed by [facility] RN.

## 2012-02-17 NOTE — Progress Notes (Addendum)
INITIAL ADULT NUTRITION ASSESSMENT Date: 02/17/2012   Time: 9:42 AM  INTERVENTION:  Recommend nutrition support initiation (TPN vs EN) in next 24 hours -- if EN started, recommend initiating Vital 1.2 formula at 15 ml/hr and increase by 10 ml every 4 hours to goal rate of 35 ml/hr with Prostat liquid protein 5 times daily via tube to provide 1508 total kcals, 138 gm protein, 681 ml of free water RD to follow for nutrition care plan  Reason for Assessment: VDRF, Low Braden  ASSESSMENT: Male 61 y.o.  Dx: anastomotic leak peritonitis   Hx:  Past Medical History  Diagnosis Date  . Hyperlipidemia   . Hypertension   . Heart murmur   . Diabetes mellitus     type 2 IDDM x 15 years  . Sleep apnea     does not wear CPAP now  . Colon polyp   . Arthritis     left knee  . 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 cefTRIAXone (ROCEPHIN)  IV  1 g Intravenous Once  . chlorhexidine  15 mL Mouth/Throat BID  . ertapenem  1 g Intravenous Q24H  . ertapenem  1 g Intravenous To OR  .  HYDROmorphone (DILAUDID) injection  1 mg Intravenous Once  . insulin aspart  0-20 Units Subcutaneous Q4H  . magnesium sulfate 1 - 4 g bolus IVPB  1 g Intravenous Once  . ondansetron (ZOFRAN) IV  4 mg Intravenous Once  . pantoprazole (PROTONIX) IV  40 mg Intravenous QHS  . sodium chloride  1,000 mL Intravenous Once  . sodium chloride  1,000 mL Intravenous Once  . sodium chloride  1,000 mL Intravenous Once  . sodium chloride  1,000 mL Intravenous Once  . sodium chloride  1,000 mL Intravenous Once  . DISCONTD:  HYDROmorphone (DILAUDID) injection  1 mg Intravenous Once  . DISCONTD: insulin glargine  10 Units Subcutaneous QHS  . DISCONTD: magnesium sulfate LVP 250-500 ml  1 g Intravenous Once  . DISCONTD: magnesium sulfate LVP 250-500 ml  1 g Intravenous Once  . DISCONTD: pantoprazole (PROTONIX) IV  40 mg Intravenous Q24H  . DISCONTD: sodium chloride  1,000 mL Intravenous  Once  . DISCONTD: sodium chloride  500 mL Intravenous Once    Ht: 5\' 11"  (180.3 cm)  Wt: 235 lb (106.595 kg)  Ideal Wt: 78.1 kg % Ideal Wt: 136%  Usual Wt: 256 lb -- per office visit record 12/25/11 % Usual Wt: 92%  Body mass index is 32.78 kg/(m^2).  Food/Nutrition Related Hx: admission nutrition screen incomplete  Labs:  CMP     Component Value Date/Time   NA 138 02/17/2012 0420   K 5.2* 02/17/2012 0420   CL 112 02/17/2012 0420   CO2 15* 02/17/2012 0420   GLUCOSE 110* 02/17/2012 0420   BUN 37* 02/17/2012 0420   CREATININE 2.40* 02/17/2012 0420   CREATININE 1.12 07/04/2011 1516   CALCIUM 6.5* 02/17/2012 0420   PROT 6.8 02/16/2012 1258   ALBUMIN 2.6* 02/16/2012 1258   AST 32 02/16/2012 1258   ALT 25 02/16/2012 1258   ALKPHOS 66 02/16/2012 1258   BILITOT 1.1 02/16/2012 1258   GFRNONAA 28* 02/17/2012 0420   GFRAA 32* 02/17/2012 0420    Phosphorus  Date Value Range Status  02/17/2012 5.4* 2.3 - 4.6 mg/dL Final    Magnesium  Date Value Range Status  02/17/2012 1.4* 1.5 - 2.5 mg/dL Final     Intake/Output  Summary (Last 24 hours) at 02/17/12 1223 Last data filed at 02/17/12 1200  Gross per 24 hour  Intake 11245.65 ml  Output   1790 ml  Net 9455.65 ml    CBG (last 3)   Basename 02/17/12 1154 02/17/12 0744 02/17/12 0418  GLUCAP 104* 81 112*    Diet Order: NPO  Supplements/Tube Feeding: N/A  IVF:    sodium chloride Last Rate: 1,000 mL/hr at 02/17/12 0320  fentaNYL infusion INTRAVENOUS Last Rate: 100 mcg/hr (02/17/12 0900)  norepinephrine (LEVOPHED) Adult infusion Last Rate: 5 mcg/min (02/17/12 0900)  sodium chloride 0.9 % 1,000 mL infusion Last Rate: 125 mL/hr at 02/17/12 [id number]  DISCONTD: sodium chloride   DISCONTD: midazolam (VERSED) infusion Last Rate: 5 mg/hr (02/17/12 0800)  DISCONTD: sodium chloride 0.9 % 1,000 mL with potassium chloride 20 mEq infusion Last Rate: 125 mL/hr at 02/17/12 0319    Estimated Nutritional Needs:   Kcal: 2140 Protein: 140-150  gm Fluid: 2.1-2.2 L  Patient is currently intubated on ventilator support MV: 12.6 Temp: 36.8  Patient underwent laparoscopic-assisted mobilization splenic flexure, right hemicolectomy with primary anastomosis, 02/06/12; after discharge had increasing abdominal pain, ongoing nausea and vomiting; s/p exp lap, ileostomy and application of wound VAC 9/15; NGT to LIS; + Fentanyl drip.  NUTRITION DIAGNOSIS: -Inadequate oral intake (NI-2.1).  Status: Ongoing  RELATED TO: inability to eat  AS EVIDENCE BY: NPO status  MONITORING/EVALUATION(Goals): Goal: Initiate nutrition support within 24-48 hours of ICU admission if prolonged intubation expected Monitor: Nutrition support initiation, respiratory status, weight, labs, I/O's  EDUCATION NEEDS: -No education needs identified at this time  Dietitian #: [place] Per approved criteria  -Obesity Unspecified    [name] 02/17/2012, 9:42 AM

## 2012-02-17 NOTE — ED Provider Notes (Signed)
Medical screening examination/treatment/procedure(s) were conducted as a shared visit with non-physician practitioner(s) and myself.  I personally evaluated the patient during the encounter 61 year old man had right hemicolectomy on 02/06/2012 for dysplastic colonic polyp, no presents with abdominal pain, distention, absent bowel sounds, and free air on plain films of abdomen.  General surgery called to see and admit pt.  Mylinda Latina III, MD 02/17/12 [phone]

## 2012-02-17 NOTE — Progress Notes (Signed)
1 Day Post-Op  Subjective: Intubated, sedated, but intermittently agitated despite Fent/Versed Low dose levophed for hypotension Low urine output  Objective: Vital signs in last 24 hours: Temp:  [97.5 F (36.4 C)-98.6 F (37 C)] 98 F (36.7 C) (09/16 0400) Pulse Rate:  [67-104] 81  (09/16 0700) Resp:  [12-27] 19  (09/16 0700) BP: (80-144)/(47-103) 97/52 mmHg (09/16 0700) SpO2:  [94 %-100 %] 100 % (09/16 0700) Arterial Line BP: (85-115)/(44-62) 109/47 mmHg (09/16 0700) FiO2 (%):  [49.5 %-50.2 %] 50.1 % (09/16 0700) Weight:  [235 lb (106.595 kg)] 235 lb (106.595 kg) (09/15 1830)    Intake/Output from previous day: 09/15 0701 - 09/16 0700 In: 9410.1 [I.V.:5150.1; Blood:700; NG/GT:60; IV Piggyback:3500] Out: 1310 [Urine:235; Emesis/NG output:150; Drains:675; Blood:250] Intake/Output this shift:    General appearance: sedated; occasionally agitated Resp: clear to auscultation bilaterally Cardio: regular rate and rhythm, S1, S2 normal, no murmur, click, rub or gallop GI: RLQ ostomy - some stool output; VAC - good seal  Lab Results:   Basename 02/17/12 0420 02/16/12 1935  WBC 16.2* 12.5*  HGB 9.9* 10.4*  HCT 27.9* 29.3*  PLT 349 355   BMET  Basename 02/17/12 0420 02/16/12 1935  NA 138 138  K 5.2* 4.7  CL 112 108  CO2 15* 17*  GLUCOSE 110* 100*  BUN 37* 32*  CREATININE 2.40* 2.32*  CALCIUM 6.5* 7.2*   PT/INR  Basename 02/16/12 1504  LABPROT 16.6*  INR 1.32   ABG  Basename 02/17/12 0423 02/16/12 2057  PHART 7.328* 7.325*  HCO3 16.4* 16.3*    Studies/Results: Dg Chest Port 1 View  02/16/2012  *RADIOLOGY REPORT*  Clinical Data: ET tube placement.  PORTABLE CHEST - 1 VIEW  Comparison: PA and lateral chest 02/08/2012.  Findings: NG tube is in place with the tip well positioned in the stomach.  Endotracheal tube is also identified with the tip 2.1 cm above the carina.  Right IJ catheter is looped in the inferior vena cava with the tip projecting retrograde near  the right subclavian vein.  No pneumothorax is identified.  Mild atelectasis in the right lung base is noted.  Left lung clear.  Cardiomegaly.  Lucency along the left heart border could be due to pneumopericardium.  IMPRESSION:  1.  ET tube in good position. 2.  Right IJ catheter is looped in the superior vena cava with the tip projecting retrograde in the right subclavian vein. 3.  Question pneumopericardium.   Original Report Authenticated By: Arvid Right. D'ALESSIO, M.D.    Dg Abd Acute W/chest  02/16/2012  *RADIOLOGY REPORT*  Clinical Data: Abdominal pain and bloating.  Partial colectomy on 02/06/2012.  Hypertension.  Diabetes.  ACUTE ABDOMEN SERIES (ABDOMEN 2 VIEW & CHEST 1 VIEW)  Comparison: 02/11/2012  Findings: Large amount of pneumoperitoneum.  Given the volume, this is abnormal despite the patient having had surgery 10 days ago. Also, the prior pneumoperitoneum has resolved back on 02/11/2012, so this represents new pneumoperitoneum.  The appearance is compatible with a perforated viscus.  Abnormal dilated loops of small bowel noted with scattered air- fluid levels.  Small bowel loops measure up to 5 cm in diameter. Probable obstruction favored over ileus.  Skin clips noted.  Low lung volumes are present on the chest radiograph, with subsegmental atelectasis in the lung bases.  IMPRESSION:  1.  Prominent (and new from 02/11/2012) pneumoperitoneum.  At 10 days out from abdominal surgery, and this amount of pneumoperitoneum is indicative of a perforated viscus.  Emergent surgical  consultation recommended. 2.  Bibasilar subsegmental atelectasis. 3.  Air fluid levels in dilated small bowel favoring small bowel obstruction over ileus.  Critical Value/emergent results were called by telephone to Dr. Verl Dicker (who verbally acknowledged these results) at the time of interpretation on 02/16/2012 at 2:08 p.m.   Original Report Authenticated By: Carron Curie, M.D.     Anti-infectives: Anti-infectives       Start     Dose/Rate Route Frequency Ordered Stop   02/16/12 1615   ertapenem (INVANZ) 1 g in sodium chloride 0.9 % 50 mL IVPB        1 g 100 mL/hr over 30 Minutes Intravenous To Surgery 02/16/12 1606 02/16/12 1733   02/16/12 1600   ertapenem (INVANZ) 1 g in sodium chloride 0.9 % 50 mL IVPB        1 g 100 mL/hr over 30 Minutes Intravenous Every 24 hours 02/16/12 1508     02/16/12 1415   cefTRIAXone (ROCEPHIN) 1 g in dextrose 5 % 50 mL IVPB        1 g 100 mL/hr over 30 Minutes Intravenous  Once 02/16/12 1407 02/16/12 1448          Assessment/Plan: s/p Procedure(s) (LRB) with comments: EXPLORATORY LAPAROTOMY (N/A) - Exploratory Laparotomy with resection of anastomosis ILEOSTOMY (N/A) APPLICATION OF WOUND VAC (N/A) s/p exp lap for anastomotic leak at staple line - 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 Zella Richer.  I have discussed with him.  LOS: 1 day    Rodney Dimalanta K. 02/17/2012

## 2012-02-17 NOTE — OR Nursing (Signed)
Late note for Foley Insertion #[id number] with urine returned per supply note.  1136 02/17/2012 Marnee Guarneri, RN Assistant Director

## 2012-02-17 NOTE — Progress Notes (Signed)
Name: [name] MRN: [id number] DOB: [birth year]    LOS: 1  Referring Provider:  [name] of [facility] Reason for Referral:  Post op resp failure, metabolic acidosis and hypotension PCP is [name], MD  Hx obtained from chart extraction in epic. Patient unable to provide hx  PULMONARY / CRITICAL CARE MEDICINE  HPI:  Patient is a 61 year old gentleman smoker, OSA on cpap, Obese, who underwent colonoscopy by [name] and was found to have a 3-4 cm sessile mass the distal transverse colon. 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 On 02/06/12 underwent laparoscopic-assisted mobilization splenic flexure, right hemicolectomy with primary anastomosis.  Postop had acute renal failure with postoperative hypovolemia, which resolved. He also had a postop ileus. He was discharged on 02/13/12. He had some difficulty getting out of bed before discharge became worse on Friday, 02/14/12. He was unable to eat more than a few bites. Saturday 02/15/12, he had increasing abdominal pain ongoing nausea some vomiting again he was unable to eat.  Then on 02/16/12 presented with  large amount of pneumoperitoneum felt to be extremely large even having had surgery several days. There was a normally dilated loops of small bowel with scattered air-fluid levels bowel loops measuring up to 5 cm in diameter. Concerning for obstruction and possible perforation.   Labs show normal white count creatinine was up to 2.26 and  CO2 is 15. EKG shows some inferior changes, but nonspecific.  He then on  02/16/12 underwent emergent  exploratory laparotomy and found to have anastomitic leak (not perforated viscus) and s/p resection ileocolonic anastamosis and ileostomy and application of wound vac. Operative course complicated by hypotension and blood loss needing 2U PRBC . Postoperative remained vent dependent and abg showed metabolic acidosis with somewhat poor resp compensation. BP soft wit MAP 64. PCCM consulted  02/16/2012 for med mgmt   DEVICES ETT 02/16/2012 Rt IJ 02/16/2012  Events Since Admission: 02/16/2012 > P lap. Admit. VDRF. ATN. Shock    SUBJECTIVE/OVERNIGHT/INTERVAL HX  - 40% fio2/peep 5. Swedation wean results in c/o pain and related agitatiion.  -Making urine but at 20cc/h - Pressor dependent this am at levophed 15mcg. CVP 11   Current Status: Critical  Vital Signs: Temp:  [97.5 F (36.4 C)-98.6 F (37 C)] 98.2 F (36.8 C) (09/16 0732) Pulse Rate:  [67-104] 82  (09/16 0800) Resp:  [12-27] 18  (09/16 0800) BP: (80-144)/(47-103) 99/53 mmHg (09/16 0800) SpO2:  [94 %-100 %] 100 % (09/16 0800) Arterial Line BP: (85-115)/(44-62) 107/46 mmHg (09/16 0800) FiO2 (%):  [39.9 %-50.2 %] 39.9 % (09/16 0800) Weight:  [106.595 kg (235 lb)] 106.595 kg (235 lb) (09/15 1830)  Physical Examination: General:  Obese male. On vent. Looks critically ill Neuro:  RASS -2 and c/op ain HEENT:  ETT tube + Neck:  Soft, R IJ + Cardiovascular:  HR 104. MAP 70, S1S2+ Lungs:  PRVC. No vent dysnchrony Abdomen:  Obese, No bowel sounds to sluggish +. Wound VAC + Musculoskeletal:  No cyanosis. No clubbing. No edema Skin:  Intact  Dg Chest Port 1 View  02/17/2012  *RADIOLOGY REPORT*  Clinical Data: Exploratory laparotomy.  Wound vac.  Perforated viscus.  PORTABLE CHEST - 1 VIEW  Comparison: 02/16/2012.  Findings: Endotracheal tube is unchanged.  The right IJ central line has been adjusted and redundant loop in the right paratracheal vascular structures is been reduced.  The tip of the right IJ central line is just superior to the confluence of the brachiocephalic  veins.  Nasogastric tube is unchanged.  Low volumes with basilar atelectasis.  No airspace disease.  Cardiopericardial silhouette within normal limits for projection and inspiration.  IMPRESSION:  1.  Reduction of redundant loop from right IJ central line. 2.  Other support apparatus stable. 3.  Low volumes with basilar atelectasis.   Original Report  Authenticated By: Dereck Ligas, M.D.    Dg Chest Port 1 View  02/16/2012  *RADIOLOGY REPORT*  Clinical Data: ET tube placement.  PORTABLE CHEST - 1 VIEW  Comparison: PA and lateral chest 02/08/2012.  Findings: NG tube is in place with the tip well positioned in the stomach.  Endotracheal tube is also identified with the tip 2.1 cm above the carina.  Right IJ catheter is looped in the inferior vena cava with the tip projecting retrograde near the right subclavian vein.  No pneumothorax is identified.  Mild atelectasis in the right lung base is noted.  Left lung clear.  Cardiomegaly.  Lucency along the left heart border could be due to pneumopericardium.  IMPRESSION:  1.  ET tube in good position. 2.  Right IJ catheter is looped in the superior vena cava with the tip projecting retrograde in the right subclavian vein. 3.  Question pneumopericardium.   Original Report Authenticated By: Arvid Right. D'ALESSIO, M.D.    Dg Abd Acute W/chest  02/16/2012  *RADIOLOGY REPORT*  Clinical Data: Abdominal pain and bloating.  Partial colectomy on 02/06/2012.  Hypertension.  Diabetes.  ACUTE ABDOMEN SERIES (ABDOMEN 2 VIEW & CHEST 1 VIEW)  Comparison: 02/11/2012  Findings: Large amount of pneumoperitoneum.  Given the volume, this is abnormal despite the patient having had surgery 10 days ago. Also, the prior pneumoperitoneum has resolved back on 02/11/2012, so this represents new pneumoperitoneum.  The appearance is compatible with a perforated viscus.  Abnormal dilated loops of small bowel noted with scattered air- fluid levels.  Small bowel loops measure up to 5 cm in diameter. Probable obstruction favored over ileus.  Skin clips noted.  Low lung volumes are present on the chest radiograph, with subsegmental atelectasis in the lung bases.  IMPRESSION:  1.  Prominent (and new from 02/11/2012) pneumoperitoneum.  At 10 days out from abdominal surgery, and this amount of pneumoperitoneum is indicative of a perforated viscus.   Emergent surgical consultation recommended. 2.  Bibasilar subsegmental atelectasis. 3.  Air fluid levels in dilated small bowel favoring small bowel obstruction over ileus.  Critical Value/emergent results were called by telephone to Dr. Verl Dicker (who verbally acknowledged these results) at the time of interpretation on 02/16/2012 at 2:08 p.m.   Original Report Authenticated By: Carron Curie, M.D.      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 ABG has poor resp compensation to metab acidosis in immediate post op period  - on 02/17/2012: Does not meet SBT criteria due to persistent metab acidosis (adequately compensated), pressor dependence   P:   Full vent support   Serial cxr and abg  CARDIOVASCULAR  Lab 02/17/12 0420 02/16/12 1935 02/16/12 1504 02/16/12 1450  TROPONINI -- -- <0.30 --  LATICACIDVEN 1.3 2.0 -- 3.4*  PROBNP -- -- -- --    A: Hypotension with high lactate and high PCT and clinical setting = Suggestive of occult septic shock  - on 02/16/12: In septic shock with CVP 11 and on levophed 70mcg but lactate has cleared  P:  CVP monitoring - fluids if < 12h Pressors for MAP > 65 Check scvo2 No steropids in view of  plap  RENAL  Lab 02/17/12 0420 02/16/12 1935 02/16/12 1236 02/13/12 0520 02/12/12 0520  NA 138 138 135 138 139  K 5.2* 4.7 -- -- --  CL 112 108 105 106 108  CO2 15* 17* 15* 22 22  BUN 37* 32* 30* 24* 24*  CREATININE 2.40* 2.32* 2.26* 1.19 1.39*  CALCIUM 6.5* 7.2* 8.7 8.8 8.2*  MG 1.4* -- -- -- --  PHOS 5.4* -- -- -- --   Intake/Output      09/15 0701 - 09/16 0700 09/16 0701 - 09/17 0700   I.V. (mL/kg) 5150.1 (48.3) 154.7 (1.5)   Blood 700    NG/GT 60    IV Piggyback 3500    Total Intake(mL/kg) 9410.1 (88.3) 154.7 (1.5)   Urine (mL/kg/hr) 235 (0.1) 30   Emesis/NG output 150 50   Drains 675    Blood 250    Total Output 1310 80   Net +8100.1 +74.7         Foley: 02/16/2012   A:  Acute renal fialure likely due to peritionitis. Sepsis  and volume depltion at admit 02/16/2012  - on 02/17/2012: creatinine slowly creeping up along with K and bic going down. Also low MAG  P: Dc KCL containing fluids (done by [facility]); recheck bmet at [facility] Monitor BMET and lytes cvp goal > 12 and MAP goal > 65   GASTROINTESTINAL  Lab 02/16/12 1258  AST 32  ALT 25  ALKPHOS 66  BILITOT 1.1  PROT 6.8  ALBUMIN 2.6*    A:  Acute anastomotic leak pertionitis at admit 02/16/2012  - on 02/17/12: Lot of pain. Some bowel sounds + P:   Per [facility]  HEMATOLOGIC  Lab 02/17/12 0420 02/16/12 1935 02/16/12 1504 02/16/12 1236 02/12/12 0520  HGB 9.9* 10.4* -- 10.1* 8.5*  HCT 27.9* 29.3* -- 28.7* 24.8*  PLT 349 355 -- 578* 272  INR -- -- 1.32 -- --  APTT -- -- 33 -- --   A:  S/p 2u prbc in OR for blood loss on 02/16/2012 - on 02/17/2012: HGb profile and suggestive of anemia of critical illness  P:  - PRBC for hgb </= 6.9gm%    - exceptions are   -  if ACS susepcted/confirmed then transfuse for hgb </= 8.0gm%,  or    -  If septic shock first 24h and scvo2 < 70% then transfuse for hgb </= 9.0gm%   - active bleeding with hemodynamic instability, then transfuse regardless of hemoglobin  value   At at all times try to transfuse 1 unit prbc as possible with exception of active hemorrhage    INFECTIOUS  Lab 02/17/12 0420 02/16/12  1935 02/16/12 1236 02/12/12 0520  WBC 16.2* 12.5* 9.2 7.5  PROCALCITON -- 28.65 -- --   Cultures: Results for orders placed during the hospital encounter of 02/16/12  MRSA PCR SCREENING     Status: Normal   Collection Time   02/16/12  6:43 PM      Component Value Range Status Comment   MRSA by PCR NEGATIVE  NEGATIVE Final     Antibiotics: Anti-infectives     Start     Dose/Rate Route Frequency Ordered Stop   02/16/12 1615   ertapenem (INVANZ) 1 g in sodium chloride 0.9 % 50 mL IVPB        1 g 100 mL/hr over 30 Minutes Intravenous To Surgery 02/16/12 1606 02/16/12 1733   02/16/12 1600   ertapenem (INVANZ) 1 g in sodium chloride 0.9 % 50 mL IVPB        1 g 100 mL/hr over 30 Minutes Intravenous Every 24 hours 02/16/12 1508     02/16/12 1415   cefTRIAXone (ROCEPHIN) 1 g in dextrose 5 % 50 mL IVPB        1 g 100 mL/hr over 30 Minutes Intravenous  Once 02/16/12 1407 02/16/12 1448           A:  Peritonitis. SEvere sepsis/septic shock P:   ABX per [facility]  ENDOCRINE  Lab 02/17/12 0744 02/17/12 0418 02/17/12 0027 02/16/12 1937 02/13/12 1324  GLUCAP 81 112* 102* 98 74   A:  Known DM   P:   ICU hyperglycemia protocol  NEUROLOGIC  A: Pain main issue on POD#1 on 02/17/2012 P:   Pain control with fent gtt DC versed gtt Use fent and versed prn  BEST PRACTICE / DISPOSITION Level of Care:  ICU Primary Service:  [facility] Consultants:  PCCM Code Status:  Full Diet:  NPO DVT Px:  SCD GI Px:  Protonix Skin Integrity:  Intact Social / Family:  None at bedside     The patient is critically ill with multiple organ systems failure and requires high complexity decision making for assessment and support, frequent evaluation and titration of therapies, application of advanced monitoring technologies and extensive interpretation of multiple  databases.   Critical Care Time devoted to patient care services described in this note is  35  Minutes.  Dr. Brand Males, M.D., [facility].C.P Pulmonary and Critical Care Medicine Staff Physician [facility] Pager: [phone], If no answer or between  15:00h - 7:00h: call [phone]  02/17/2012 8:46 AM

## 2012-02-17 NOTE — Telephone Encounter (Signed)
Forward 6 pages from [place] to Dr. Erskine Emery for review on 02-17-12 ym

## 2012-02-18 ENCOUNTER — Inpatient Hospital Stay (HOSPITAL_COMMUNITY): Payer: Medicaid Other

## 2012-02-18 ENCOUNTER — Encounter (HOSPITAL_COMMUNITY): Payer: Self-pay | Admitting: Certified Registered Nurse Anesthetist

## 2012-02-18 ENCOUNTER — Encounter (HOSPITAL_COMMUNITY)
Admission: EM | Disposition: A | Discharge status: Skilled Nursing Facility | Payer: Self-pay | Source: Home / Self Care | Attending: Surgery

## 2012-02-18 ENCOUNTER — Inpatient Hospital Stay (HOSPITAL_COMMUNITY): Payer: Medicaid Other | Admitting: Certified Registered Nurse Anesthetist

## 2012-02-18 DIAGNOSIS — K660 Peritoneal adhesions (postprocedural) (postinfection): Secondary | ICD-10-CM

## 2012-02-18 HISTORY — PX: LAPAROTOMY: SHX154

## 2012-02-18 HISTORY — PX: DRESSING CHANGE UNDER ANESTHESIA: SHX5237

## 2012-02-18 LAB — BASIC METABOLIC PANEL
BUN: 46 mg/dL — ABNORMAL HIGH (ref 6–23)
CO2: 16 mEq/L — ABNORMAL LOW (ref 19–32)
Calcium: 6.5 mg/dL — ABNORMAL LOW (ref 8.4–10.5)
Chloride: 115 mEq/L — ABNORMAL HIGH (ref 96–112)
Creatinine, Ser: 2.24 mg/dL — ABNORMAL HIGH (ref 0.50–1.35)
GFR calc Af Amer: 35 mL/min — ABNORMAL LOW (ref >90)
GFR calc non Af Amer: 30 mL/min — ABNORMAL LOW (ref >90)
Glucose, Bld: 91 mg/dL (ref 70–99)
Potassium: 4.6 mEq/L (ref 3.5–5.1)
Sodium: 140 mEq/L (ref 135–145)

## 2012-02-18 LAB — GLUCOSE, CAPILLARY
Glucose-Capillary: 106 mg/dL — ABNORMAL HIGH (ref 70–99)
Glucose-Capillary: 125 mg/dL — ABNORMAL HIGH (ref 70–99)
Glucose-Capillary: 125 mg/dL — ABNORMAL HIGH (ref 70–99)
Glucose-Capillary: 141 mg/dL — ABNORMAL HIGH (ref 70–99)
Glucose-Capillary: 89 mg/dL (ref 70–99)
Glucose-Capillary: 94 mg/dL (ref 70–99)

## 2012-02-18 LAB — POCT I-STAT 7, (LYTES, BLD GAS, ICA,H+H)
Acid-base deficit: 12 mmol/L — ABNORMAL HIGH (ref 0.0–2.0)
Bicarbonate: 14.4 mEq/L — ABNORMAL LOW (ref 20.0–24.0)
Calcium, Ion: 1.08 mmol/L — ABNORMAL LOW (ref 1.13–1.30)
HCT: 22 % — ABNORMAL LOW (ref 39.0–52.0)
Hemoglobin: 7.5 g/dL — ABNORMAL LOW (ref 13.0–17.0)
O2 Saturation: 100 %
Patient temperature: 35
Potassium: 4.4 mEq/L (ref 3.5–5.1)
Sodium: 142 mEq/L (ref 135–145)
TCO2: 15 mmol/L (ref 0–100)
pCO2 arterial: 29.8 mmHg — ABNORMAL LOW (ref 35.0–45.0)
pH, Arterial: 7.281 — ABNORMAL LOW (ref 7.350–7.450)
pO2, Arterial: 390 mmHg — ABNORMAL HIGH (ref 80.0–100.0)

## 2012-02-18 LAB — CBC
HCT: 26.7 % — ABNORMAL LOW (ref 39.0–52.0)
Hemoglobin: 9.6 g/dL — ABNORMAL LOW (ref 13.0–17.0)
MCH: 25.3 pg — ABNORMAL LOW (ref 26.0–34.0)
MCHC: 36 g/dL (ref 30.0–36.0)
MCV: 70.4 fL — ABNORMAL LOW (ref 78.0–100.0)
Platelets: 367 10*3/uL (ref 150–400)
RBC: 3.79 MIL/uL — ABNORMAL LOW (ref 4.22–5.81)
RDW: 19 % — ABNORMAL HIGH (ref 11.5–15.5)
WBC: 15.6 10*3/uL — ABNORMAL HIGH (ref 4.0–10.5)

## 2012-02-18 LAB — POCT I-STAT 3, ART BLOOD GAS (G3+)
Acid-base deficit: 10 mmol/L — ABNORMAL HIGH (ref 0.0–2.0)
Bicarbonate: 15.4 mEq/L — ABNORMAL LOW (ref 20.0–24.0)
O2 Saturation: 99 %
Patient temperature: 98.5
TCO2: 16 mmol/L (ref 0–100)
pCO2 arterial: 30.5 mmHg — ABNORMAL LOW (ref 35.0–45.0)
pH, Arterial: 7.31 — ABNORMAL LOW (ref 7.350–7.450)
pO2, Arterial: 126 mmHg — ABNORMAL HIGH (ref 80.0–100.0)

## 2012-02-18 LAB — URINE CULTURE
Colony Count: NO GROWTH
Culture: NO GROWTH

## 2012-02-18 LAB — MAGNESIUM: Magnesium: 1.6 mg/dL (ref 1.5–2.5)

## 2012-02-18 LAB — PHOSPHORUS: Phosphorus: 4.2 mg/dL (ref 2.3–4.6)

## 2012-02-18 IMAGING — CR DG CHEST 1V PORT
1 series · 1 of 1 positions shown · non-contrast
Comparison: [DATE]

CLINICAL DATA: Check endotracheal tube position

PORTABLE CHEST - 1 VIEW

[AP]
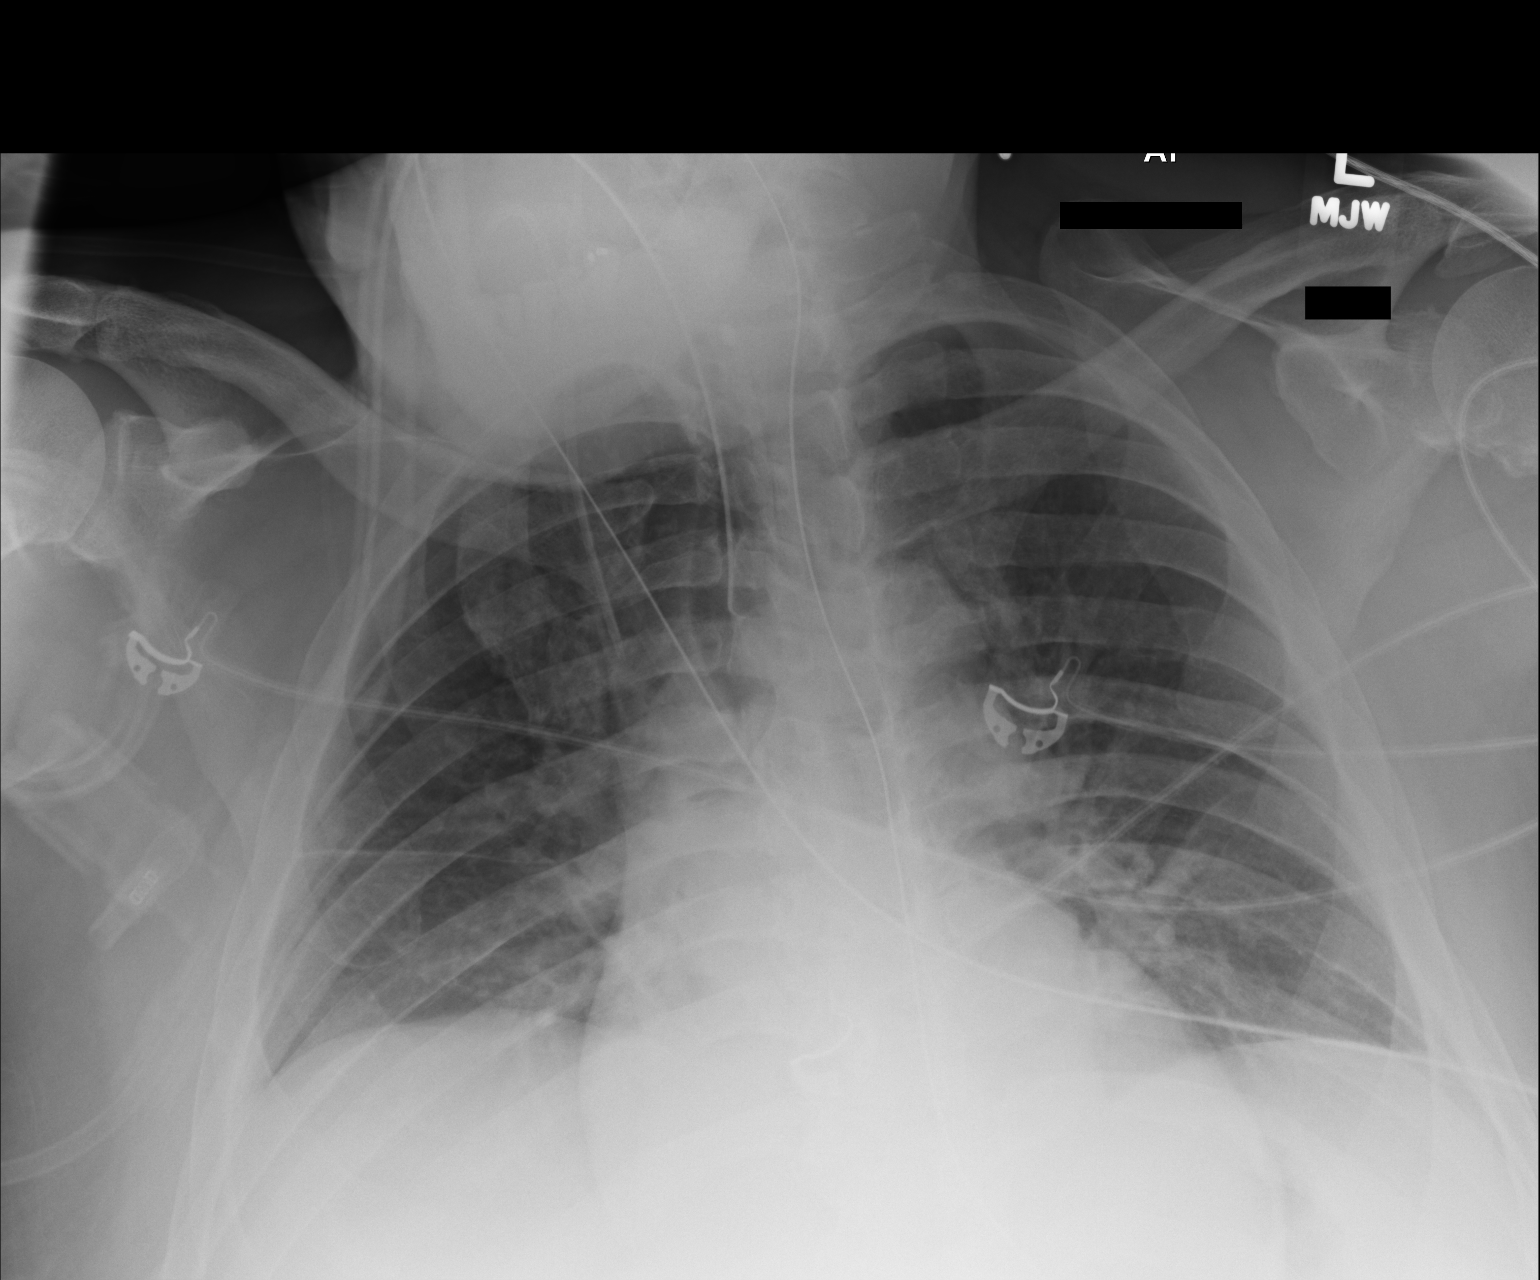

[1 of 1 positions shown; findings below may reference images not displayed]

FINDINGS: Cardiomediastinal silhouette is stable.  Stable NG tube
position.  Endotracheal tube in place with tip 3.8 cm above the
carina.  Stable right IJ central line position.  No acute
infiltrate or pulmonary edema.  Poor inspiration with mild basilar
atelectasis.
IMPRESSION: Stable support apparatus.  Mild basilar atelectasis.

## 2012-02-18 SURGERY — LAPAROTOMY, EXPLORATORY
Anesthesia: General | Site: Abdomen | Wound class: Dirty or Infected

## 2012-02-18 MED ORDER — SODIUM CHLORIDE 0.9 % IV SOLN
50.0000 ug/h | INTRAVENOUS | Status: DC
  Administered 2012-02-18 – 2012-02-19 (×4): 350 ug/h via INTRAVENOUS
  Administered 2012-02-20: 400 ug/h via INTRAVENOUS
  Administered 2012-02-20: 350 ug/h via INTRAVENOUS
  Administered 2012-02-21: 400 ug/h via INTRAVENOUS
  Administered 2012-02-21: 200 ug/h via INTRAVENOUS
  Administered 2012-02-21 – 2012-02-28 (×22): 400 ug/h via INTRAVENOUS
  Filled 2012-02-18 (×35): qty 50

## 2012-02-18 MED ORDER — FENTANYL BOLUS VIA INFUSION
50.0000 ug | Freq: Four times a day (QID) | INTRAVENOUS | Status: DC | PRN
  Administered 2012-02-18: 100 ug via INTRAVENOUS
  Filled 2012-02-18: qty 200

## 2012-02-18 MED ORDER — MIDAZOLAM HCL 2 MG/2ML IJ SOLN
1.0000 mg | INTRAMUSCULAR | Status: DC | PRN
  Administered 2012-02-18 – 2012-02-19 (×4): 4 mg via INTRAVENOUS
  Filled 2012-02-18 (×4): qty 4

## 2012-02-18 MED ORDER — ROCURONIUM BROMIDE 100 MG/10ML IV SOLN
INTRAVENOUS | Status: DC | PRN
  Administered 2012-02-18 (×2): 50 mg via INTRAVENOUS

## 2012-02-18 MED ORDER — NOREPINEPHRINE BITARTRATE 1 MG/ML IJ SOLN
4000.0000 ug | INTRAVENOUS | Status: DC | PRN
  Administered 2012-02-18: 15 ug/min via INTRAVENOUS

## 2012-02-18 MED ORDER — 0.9 % SODIUM CHLORIDE (POUR BTL) OPTIME
TOPICAL | Status: DC | PRN
  Administered 2012-02-18: 3000 mL

## 2012-02-18 MED ORDER — SODIUM BICARBONATE 8.4 % IV SOLN
INTRAVENOUS | Status: DC
  Administered 2012-02-18 – 2012-02-20 (×5): via INTRAVENOUS
  Filled 2012-02-18 (×7): qty 100

## 2012-02-18 MED ORDER — MAGNESIUM SULFATE 50 % IJ SOLN
1.0000 g | Freq: Once | INTRAVENOUS | Status: AC
  Administered 2012-02-18: 1 g via INTRAVENOUS
  Filled 2012-02-18: qty 2

## 2012-02-18 MED ORDER — SODIUM CHLORIDE 0.9 % IV SOLN
2500.0000 ug | INTRAVENOUS | Status: DC | PRN
  Administered 2012-02-18: 350 ug/h via INTRAVENOUS

## 2012-02-18 SURGICAL SUPPLY — 48 items
BLADE SURG ROTATE 9660 (MISCELLANEOUS) IMPLANT
CANISTER SUCTION 2500CC (MISCELLANEOUS) ×4 IMPLANT
CANISTER WOUND CARE 500ML ATS (WOUND CARE) ×1 IMPLANT
CHLORAPREP W/TINT 26ML (MISCELLANEOUS) ×1 IMPLANT
CLIP TI LARGE 6 (CLIP) IMPLANT
CLIP TI MEDIUM 6 (CLIP) IMPLANT
CLOTH BEACON ORANGE TIMEOUT ST (SAFETY) ×2 IMPLANT
COVER SURGICAL LIGHT HANDLE (MISCELLANEOUS) ×2 IMPLANT
DRAIN HEMOVAC 1/8 X 5 (WOUND CARE) IMPLANT
DRAPE INCISE IOBAN 66X45 STRL (DRAPES) IMPLANT
DRAPE LAPAROSCOPIC ABDOMINAL (DRAPES) ×2 IMPLANT
DRAPE WARM FLUID 44X44 (DRAPE) ×2 IMPLANT
ELECT BLADE 6.5 EXT (BLADE) IMPLANT
ELECT REM PT RETURN 9FT ADLT (ELECTROSURGICAL) ×2
ELECTRODE REM PT RTRN 9FT ADLT (ELECTROSURGICAL) ×1 IMPLANT
EVACUATOR SILICONE 100CC (DRAIN) IMPLANT
GLOVE BIOGEL PI IND STRL 8 (GLOVE) ×1 IMPLANT
GLOVE BIOGEL PI INDICATOR 8 (GLOVE) ×1
GLOVE ECLIPSE 8.0 STRL XLNG CF (GLOVE) ×4 IMPLANT
GLOVE SURG SS PI 7.0 STRL IVOR (GLOVE) ×1 IMPLANT
GOWN STRL NON-REIN LRG LVL3 (GOWN DISPOSABLE) ×6 IMPLANT
KIT BASIN OR (CUSTOM PROCEDURE TRAY) ×2 IMPLANT
KIT OSTOMY DRAINABLE 2.75 STR (WOUND CARE) ×1 IMPLANT
KIT ROOM TURNOVER OR (KITS) ×2 IMPLANT
LIGASURE IMPACT 36 18CM CVD LR (INSTRUMENTS) IMPLANT
NS IRRIG 1000ML POUR BTL (IV SOLUTION) ×4 IMPLANT
PACK GENERAL/GYN (CUSTOM PROCEDURE TRAY) ×2 IMPLANT
PAD ARMBOARD 7.5X6 YLW CONV (MISCELLANEOUS) ×3 IMPLANT
SPECIMEN JAR X LARGE (MISCELLANEOUS) ×1 IMPLANT
SPONGE ABDOMINAL VAC ABTHERA (MISCELLANEOUS) ×1 IMPLANT
SPONGE GAUZE 4X4 12PLY (GAUZE/BANDAGES/DRESSINGS) ×2 IMPLANT
SPONGE LAP 18X18 X RAY DECT (DISPOSABLE) IMPLANT
STAPLER VISISTAT 35W (STAPLE) ×2 IMPLANT
SUCTION POOLE TIP (SUCTIONS) ×1 IMPLANT
SUT CHROMIC GUT AB #0 18 (SUTURE) IMPLANT
SUT ETHILON 3 0 FSL (SUTURE) IMPLANT
SUT PDS AB 1 CTX 36 (SUTURE) ×2 IMPLANT
SUT PROLENE 2 0 SH DA (SUTURE) IMPLANT
SUT SILK 2 0 SH (SUTURE) IMPLANT
SUT VIC AB 2-0 SH 18 (SUTURE) IMPLANT
SUT VIC AB 3-0 SH 18 (SUTURE) ×2 IMPLANT
SUT VICRYL AB 2 0 TIES (SUTURE) IMPLANT
TOWEL OR 17X24 6PK STRL BLUE (TOWEL DISPOSABLE) ×2 IMPLANT
TOWEL OR 17X26 10 PK STRL BLUE (TOWEL DISPOSABLE) ×2 IMPLANT
TRAY FOLEY CATH 14FRSI W/METER (CATHETERS) ×1 IMPLANT
UNDERPAD 30X30 INCONTINENT (UNDERPADS AND DIAPERS) IMPLANT
WATER STERILE IRR 1000ML POUR (IV SOLUTION) ×2 IMPLANT
YANKAUER SUCT BULB TIP NO VENT (SUCTIONS) ×1 IMPLANT

## 2012-02-18 NOTE — Progress Notes (Signed)
Pt's HR has dropped to 40's and sustained. MD Ramaswamy made aware- new orders for EKG received. No drop from baseline in BP with event or in O2 saturation.   Will monitor.   Lorre Munroe

## 2012-02-18 NOTE — Progress Notes (Signed)
Name: [name] MRN: [id number] DOB: 1950-09-25    LOS: 2  Referring Provider:  [name] of [facility] Reason for Referral:  Post op resp failure, metabolic acidosis and hypotension PCP is [name], MD  Hx obtained from chart extraction in epic. Patient unable to provide hx  PULMONARY / CRITICAL CARE MEDICINE  HPI:  Patient is a 61 year old gentleman smoker, OSA on cpap, Obese, who underwent colonoscopy by [name] and was found to have a 3-4 cm sessile mass the distal transverse colon. 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 On 02/06/12 underwent laparoscopic-assisted mobilization splenic flexure, right hemicolectomy with primary anastomosis.  Postop had acute renal failure with postoperative hypovolemia, which resolved. He also had a postop ileus. He was discharged on 02/13/12. He had some difficulty getting out of bed before discharge became worse on Friday, 02/14/12. He was unable to eat more than a few bites. Saturday 02/15/12, he had increasing abdominal pain ongoing nausea some vomiting again he was unable to eat.  Then on 02/16/12 presented with  large amount of pneumoperitoneum felt to be extremely large even having had surgery several days. There was a normally dilated loops of small bowel with scattered air-fluid levels bowel loops measuring up to 5 cm in diameter. Concerning for obstruction and possible perforation.   Labs show normal white count creatinine was up to 2.26 and  CO2 is 15. EKG shows some inferior changes, but nonspecific.  He then on  02/16/12 underwent emergent  exploratory laparotomy and found to have anastomitic leak (not perforated viscus) and s/p resection ileocolonic anastamosis and ileostomy and application of wound vac. Operative course complicated by hypotension and blood loss needing 2U PRBC . Postoperative remained vent dependent and abg showed metabolic acidosis with somewhat poor resp compensation. BP soft wit MAP 64. PCCM consulted  02/16/2012 for med mgmt   DEVICES ETT 02/16/2012 Rt IJ 02/16/2012  Events Since Admission: 02/16/2012 > P lap. Admit. VDRF. ATN. Lactic acidosis. Periotinitis. P lap due to anast leak 02/17/12: - 40% fio2/peep 5. Swedation wean results in c/o pain and related agitatiion. Making urine but at 20cc/h. Pressor dependent    SUBJECTIVE/OVERNIGHT/INTERVAL HX   02/18/12: Pain still an issue with resultant agitation - needing fent gtt and lot of versed boluses. Lot of fluid boluses fro CVP >10 but < 12. Levophed need worse to 13mcg. Plans for OR today for wound wash out. FEbrile once and on scheduled tylenol now  Current Status: Critical  Vital Signs: Temp:  [98.3 F (36.8 C)-102.4 F (39.1 C)] 98.3 F (36.8 C) (09/17 0716) Pulse Rate:  [70-98] 70  (09/17 0700) Resp:  [17-23] 18  (09/17 0700) BP: (88-131)/(46-69) 95/54 mmHg (09/17 0700) SpO2:  [99 %-100 %] 100 % (09/17 0700) Arterial Line BP: (53-147)/(44-60) 64/54 mmHg (09/17 0700) FiO2 (%):  [29.7 %-40.2 %] 30 % (09/17 0700) Weight:  [107.8 kg (237 lb 10.5 oz)] 107.8 kg (237 lb 10.5 oz) (09/17 0500)  Physical Examination: General:  Obese male. On vent. Looks critically ill Neuro:  RASS -2 and c/o pain. Appear more disoriented today HEENT:  ETT tube + Neck:  Soft, R IJ + Cardiovascular:  HR 104. MAP 70, S1S2+ Lungs:  PRVC. No vent dysnchrony Abdomen:  Obese, BOWEL SOUNDS +++. Wound VAC + Musculoskeletal:  No cyanosis. No clubbing. No edema Skin:  Intact  Dg Chest Port 1 View  02/17/2012  *RADIOLOGY REPORT*  Clinical Data: Exploratory laparotomy.  Wound vac.  Perforated viscus.  PORTABLE  CHEST - 1 VIEW  Comparison: 02/16/2012.  Findings: Endotracheal tube is unchanged.  The right IJ central line has been adjusted and redundant loop in the right paratracheal vascular structures is been reduced.  The tip of the right IJ central line is just superior to the confluence of the brachiocephalic veins.  Nasogastric tube is unchanged.  Low volumes  with basilar atelectasis.  No airspace disease.  Cardiopericardial silhouette within normal limits for projection and inspiration.  IMPRESSION:  1.  Reduction of redundant loop from right IJ central line. 2.  Other support apparatus stable. 3.  Low volumes with basilar atelectasis.   Original Report Authenticated By: Dereck Ligas, M.D.    Dg Chest Port 1 View  02/16/2012  *RADIOLOGY REPORT*  Clinical Data: ET tube placement.  PORTABLE CHEST - 1 VIEW  Comparison: PA and lateral chest 02/08/2012.  Findings: NG tube is in place with the tip well positioned in the stomach.  Endotracheal tube is also identified with the tip 2.1 cm above the carina.  Right IJ catheter is looped in the inferior vena cava with the tip projecting retrograde near the right subclavian vein.  No pneumothorax is identified.  Mild atelectasis in the right lung base is noted.  Left lung clear.  Cardiomegaly.  Lucency along the left heart border could be due to pneumopericardium.  IMPRESSION:  1.  ET tube in good position. 2.  Right IJ catheter is looped in the superior vena cava with the tip projecting retrograde in the right subclavian vein. 3.  Question pneumopericardium.   Original Report Authenticated By: Arvid Right. D'ALESSIO, M.D.    Dg Abd Acute W/chest  02/16/2012  *RADIOLOGY REPORT*  Clinical Data: Abdominal pain and bloating.  Partial colectomy on 02/06/2012.  Hypertension.  Diabetes.  ACUTE ABDOMEN SERIES (ABDOMEN 2 VIEW & CHEST 1 VIEW)  Comparison: 02/11/2012  Findings: Large amount of pneumoperitoneum.  Given the volume, this is abnormal despite the patient having had surgery 10 days ago. Also, the prior pneumoperitoneum has resolved back on 02/11/2012, so this represents new pneumoperitoneum.  The appearance is compatible with a perforated viscus.  Abnormal dilated loops of small bowel noted with scattered air- fluid levels.  Small bowel loops measure up to 5 cm in diameter. Probable obstruction favored over ileus.  Skin clips  noted.  Low lung volumes are present on the chest radiograph, with subsegmental atelectasis in the lung bases.  IMPRESSION:  1.  Prominent (and new from 02/11/2012) pneumoperitoneum.  At 10 days out from abdominal surgery, and this amount of pneumoperitoneum is indicative of a perforated viscus.  Emergent surgical consultation recommended. 2.  Bibasilar subsegmental atelectasis. 3.  Air fluid levels in dilated small bowel favoring small bowel obstruction over ileus.  Critical Value/emergent results were called by telephone to Dr. Verl Dicker (who verbally acknowledged these results) at the time of interpretation on 02/16/2012 at 2:08 p.m.   Original Report Authenticated By: Carron Curie, M.D.      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 ABG has poor resp compensation to metab acidosis in immediate post op period  - on 02/18/2012: Does not meet SBT criteria due to persistent metab acidosis (adequately compensated), pressor dependence   P:   Full vent support   Serial cxr and abg  CARDIOVASCULAR  Lab 02/17/12 0420 02/16/12 1935 02/16/12 1504 02/16/12 1450  TROPONINI --  -- <0.30 --  LATICACIDVEN 1.3 2.0 -- 3.4*  PROBNP -- -- -- --    A: Hypotension with high lactate and high PCT and clinical setting = Suggestive of occult septic shock  - on 02/17/12: In septic shock with CVP 11 and on levophed 29mcg but lactate has cleared. SCvo2 70s  - on 02/18/12: levophed need at 52mcg  P:  CVP monitoring - fluids if < 10 (threshold lowered from 12 to 10) Pressors for MAP > 65 No steropids in view of plap  RENAL  Lab 02/18/12 0405 02/17/12 1413 02/17/12 0420 02/16/12 1935 02/16/12 1236  NA 140 139 138 138 135  K 4.6 4.7 -- -- --  CL 115* 113* 112 108 105  CO2 16* 16* 15* 17* 15*  BUN 46* 42* 37* 32* 30*  CREATININE 2.24* 2.28* 2.40* 2.32* 2.26*  CALCIUM 6.5* 6.6* 6.5* 7.2* 8.7  MG 1.6 -- 1.4* -- --  PHOS 4.2 -- 5.4* -- --   Intake/Output      09/16 0701 - 09/17 0700 09/17 0701 - 09/18 0700   I.V. (mL/kg) 3674.6 (34.1)    Blood     NG/GT 30    IV Piggyback 2360    Total Intake(mL/kg) 6064.6 (56.3)    Urine (mL/kg/hr) 1065 (0.4)    Emesis/NG output 300    Drains 800    Stool 175    Blood     Total Output 2340    Net +3724.6          Foley: 02/16/2012   A:  Acute renal fialure likely due to peritionitis. Sepsis  and volume depltion at admit 02/16/2012  - on 02/18/2012: creatinine slowly stabilizing/improving along with K and bicarb. Non-gap metabolic acidosis persists ? Bicarb loss in GI/. Hypomag + P Add bicarb replacement on 02/18/12 Replete mag Monitor BMET and lytes cvp goal > 10 and MAP goal > 65   GASTROINTESTINAL  Lab 02/16/12 1258  AST 32  ALT 25  ALKPHOS 66  BILITOT 1.1  PROT 6.8  ALBUMIN 2.6*    A:  Acute anastomotic leak pertionitis at admit 02/16/2012  - on 02/17/12: Lot of pain. Some bowel sounds +  - on 02/18/12: Active bowel sounds +. Plans for OR today P:   Per [facility]  HEMATOLOGIC  Lab 02/18/12 0405 02/17/12 0420 02/16/12 1935 02/16/12 1504 02/16/12 1236 02/12/12 0520  HGB 9.6* 9.9* 10.4* -- 10.1* 8.5*  HCT 26.7* 27.9*  29.3* -- 28.7* 24.8*  PLT 367 349 355 -- 578* 272  INR -- -- -- 1.32 -- --  APTT -- -- -- 33 -- --   A:  S/p 2u prbc in OR for blood loss on 02/16/2012 - on 02/18/2012: HGb profile and suggestive  of anemia of critical illness  P:  - PRBC for hgb </= 6.9gm%     - exceptions are   -  if ACS susepcted/confirmed then transfuse for hgb </= 8.0gm%,  or    -  If septic shock first 24h and scvo2 < 70% then transfuse for hgb </= 9.0gm%   - active bleeding with hemodynamic instability, then transfuse regardless of hemoglobin value   At at all times try to transfuse 1 unit prbc as possible with exception of active hemorrhage    INFECTIOUS  Lab 02/18/12 0405 02/17/12 0420 02/16/12 1935 02/16/12 1236 02/12/12 0520  WBC 15.6* 16.2* 12.5* 9.2 7.5  PROCALCITON -- -- 28.65 -- --   Cultures: Results for orders placed during the hospital encounter of 02/16/12  MRSA PCR SCREENING     Status: Normal   Collection Time   02/16/12  6:43 PM      Component Value Range Status Comment   MRSA by PCR NEGATIVE  NEGATIVE Final     Antibiotics: Anti-infectives     Start     Dose/Rate Route Frequency Ordered Stop   02/16/12 1615   ertapenem (INVANZ) 1 g in sodium chloride 0.9 % 50 mL IVPB        1 g 100 mL/hr over 30 Minutes Intravenous To Surgery 02/16/12 1606 02/16/12 1733   02/16/12 1600   ertapenem (INVANZ) 1 g in sodium chloride 0.9 % 50 mL IVPB        1 g 100 mL/hr over 30 Minutes Intravenous Every 24 hours 02/16/12 1508     02/16/12 1415   cefTRIAXone (ROCEPHIN) 1 g in dextrose 5 % 50 mL IVPB        1 g 100 mL/hr over 30 Minutes Intravenous  Once 02/16/12 1407 02/16/12 1448           A:  Peritonitis. SEvere sepsis/septic shock P:   ABX per [facility]  ENDOCRINE  Lab 02/18/12 0356 02/17/12 2325 02/17/12 1934 02/17/12 1559 02/17/12 1154  GLUCAP 94 89 81 89 104*   A:  Known DM   P:   ICU hyperglycemia protocol  NEUROLOGIC  A: Pain main issue on POD#2 on 02/18/2012 P:   Pain control with  fent gtt DC versed gtt Use fent and versed prn (RN asked to use more fent insetead of versed)  BEST PRACTICE / DISPOSITION Level of Care:  ICU Primary Service:  [facility] Consultants:  PCCM Code Status:  Full Diet:  NPO DVT Px:  SCD GI Px:  Protonix Skin Integrity:  Intact Social / Family:  None at bedside     The patient is critically ill with multiple organ systems failure and requires high complexity decision making for assessment and support, frequent evaluation and titration of therapies, application of advanced monitoring technologies and extensive interpretation of multiple databases.   Critical Care Time devoted to patient care services described in this note is  35  Minutes.  Dr. Brand Males, M.D., [facility].C.P Pulmonary and Critical Care Medicine Staff Physician [facility] Pager: [phone], If no answer or between  15:00h - 7:00h: call [phone]  02/18/2012 7:37 AM

## 2012-02-18 NOTE — Transfer of Care (Signed)
Immediate Anesthesia Transfer of Care Note  Patient: [name]  Procedure(s) Performed: Procedure(s) (LRB) with comments: EXPLORATORY LAPAROTOMY (N/A) - exploratory laparotomy  change of abdominal vac dressing DRESSING CHANGE UNDER ANESTHESIA (N/A)  Patient Location: PACU  Anesthesia Type: General  Level of Consciousness: sedated  Airway & Oxygen Therapy: Patient remains intubated per anesthesia plan and Patient placed on Ventilator (see vital sign flow sheet for setting)  Post-op Assessment: Report given to PACU RN and Post -op Vital signs reviewed and stable  Post vital signs: Reviewed and stable  Complications: No apparent anesthesia complications

## 2012-02-18 NOTE — Anesthesia Preprocedure Evaluation (Signed)
Anesthesia Evaluation  Patient identified by MRN, date of birth, ID band Patient unresponsive    Reviewed: Patient's Chart, lab work & pertinent test results, Unable to perform ROS - Chart review only  Airway       Dental   Pulmonary          Cardiovascular     Neuro/Psych    GI/Hepatic   Endo/Other    Renal/GU      Musculoskeletal   Abdominal   Peds  Hematology   Anesthesia Other Findings   Reproductive/Obstetrics                           Anesthesia Physical Anesthesia Plan  ASA: IV  Anesthesia Plan: General   Post-op Pain Management:    Induction: Inhalational  Airway Management Planned:   Additional Equipment:   Intra-op Plan:   Post-operative Plan: Post-operative intubation/ventilation  Informed Consent: I have reviewed the patients History and Physical, chart, labs and discussed the procedure including the risks, benefits and alternatives for the proposed anesthesia with the patient or authorized representative who has indicated his/her understanding and acceptance.   History available from chart only  Plan Discussed with: Anesthesiologist and Surgeon  Anesthesia Plan Comments:         Anesthesia Quick Evaluation

## 2012-02-18 NOTE — Care Management Note (Unsigned)
    Page 1 of 1   02/18/2012     4:42:02 PM   CARE MANAGEMENT NOTE 02/18/2012  Patient:  [name]   Account Number:  [id number]  Date Initiated:  02/18/2012  Documentation initiated by:  Montravious Weigelt  Subjective/Objective Assessment:   PT S/P EXPLORATORY LAPAROTOMY, ILEOSTOMY, AND APPLICATION OF WOUND VAC ON 02/16/12.  PTA, PT RESIDED AT [place]. HE HAD JUST BEEN DISCHARGED FROM [facility], AND HAD ONE VISIT FROM [facility] PRIOR TO READMISSION.     Action/Plan:   PT BACK TO OR TODAY; REMAINS INTUBATED.  PT HAS HAD WIFE AND DAUGHTER VISITING, BUT ARE NOT CURRENTLY AT BEDSIDE. WILL ATTEMPT TO MAKE CONTACT WITH FAMILY TO DISCUSS DC PLANS.   Anticipated DC Date:  02/23/2012   Anticipated DC Plan:  SKILLED NURSING FACILITY  In-house referral  Clinical Social Worker      DC Planning Services  CM consult      Choice offered to / List presented to:             Status of service:  In process, will continue to follow Medicare Important Message given?   (If response is "NO", the following Medicare IM given date fields will be blank) Date Medicare IM given:   Date Additional Medicare IM given:    Discharge Disposition:    Per UR Regulation:  Reviewed for med. necessity/level of care/duration of stay  If discussed at [place] of Stay Meetings, dates discussed:    Comments:  02/18/12 Choice [name] PT WILL NEED PT AND OT CONSULTS WHEN MEDICALLY ABLE TO TOLERATE.  WILL LIKELY NEED SNF AT DISCHARGE, PER LIVING SITUATION AND SUPPORT, PER CHARGE NURSE.  WILL CONSULT CSW TO FACILITATE POSSIBLE DC TO SNF IF NEEDED.  WILL DISCUSS DC PLANS WITH FAMILY AS THEY ARE AVAILABLE.

## 2012-02-18 NOTE — Progress Notes (Signed)
2 Days Post-Op  Subjective: On vent.  Agitated at times.  Still on some Levophed.  Objective: Vital signs in last 24 hours: Temp:  [98.3 F (36.8 C)-102.4 F (39.1 C)] 98.3 F (36.8 C) (09/17 0716) Pulse Rate:  [69-98] 69  (09/17 0800) Resp:  [17-23] 18  (09/17 0800) BP: (88-131)/(46-69) 90/55 mmHg (09/17 0800) SpO2:  [99 %-100 %] 100 % (09/17 0800) Arterial Line BP: (53-147)/(44-60) 87/51 mmHg (09/17 0800) FiO2 (%):  [29.7 %-40.2 %] 30.2 % (09/17 0800) Weight:  [237 lb 10.5 oz (107.8 kg)] 237 lb 10.5 oz (107.8 kg) (09/17 0500) Last BM Date: 02/17/12 (ileostomy active liquid brown stool)  Intake/Output from previous day: 09/16 0701 - 09/17 0700 In: 6064.6 [I.V.:3674.6; NG/GT:30; IV Piggyback:2360] Out: 2340 [Urine:1065; Emesis/NG output:300; Drains:800; Stool:175] Intake/Output this shift: Total I/O In: 185 [I.V.:155; NG/GT:30] Out: 80 [Urine:30; Drains:50]  PE: Abd-slight firm, VAC on   Lab Results:   Basename 02/18/12 0405 02/17/12 0420  WBC 15.6* 16.2*  HGB 9.6* 9.9*  HCT 26.7* 27.9*  PLT 367 349   BMET  Basename 02/18/12 0405 02/17/12 1413  NA 140 139  K 4.6 4.7  CL 115* 113*  CO2 16* 16*  GLUCOSE 91 117*  BUN 46* 42*  CREATININE 2.24* 2.28*  CALCIUM 6.5* 6.6*   PT/INR  Basename 02/16/12 1504  LABPROT 16.6*  INR 1.32   Comprehensive Metabolic Panel:    Component Value Date/Time   NA 140 02/18/2012 0405   K 4.6 02/18/2012 0405   CL 115* 02/18/2012 0405   CO2 16* 02/18/2012 0405   BUN 46* 02/18/2012 0405   CREATININE 2.24* 02/18/2012 0405   CREATININE 1.12 07/04/2011 1516   GLUCOSE 91 02/18/2012 0405   CALCIUM 6.5* 02/18/2012 0405   AST 32 02/16/2012 1258   ALT 25 02/16/2012 1258   ALKPHOS 66 02/16/2012 1258   BILITOT 1.1 02/16/2012 1258   PROT 6.8 02/16/2012 1258   ALBUMIN 2.6* 02/16/2012 1258     Studies/Results: Dg Chest Port 1 View  02/18/2012  *RADIOLOGY REPORT*  Clinical Data: Check endotracheal tube position  PORTABLE CHEST - 1 VIEW   Comparison: 02/17/2012  Findings: Cardiomediastinal silhouette is stable.  Stable NG tube position.  Endotracheal tube in place with tip 3.8 cm above the carina.  Stable right IJ central line position.  No acute infiltrate or pulmonary edema.  Poor inspiration with mild basilar atelectasis.  IMPRESSION: Stable support apparatus.  Mild basilar atelectasis.   Original Report Authenticated By: Lahoma Crocker, M.D.    Dg Chest Port 1 View  02/17/2012  *RADIOLOGY REPORT*  Clinical Data: Exploratory laparotomy.  Wound vac.  Perforated viscus.  PORTABLE CHEST - 1 VIEW  Comparison: 02/16/2012.  Findings: Endotracheal tube is unchanged.  The right IJ central line has been adjusted and redundant loop in the right paratracheal vascular structures is been reduced.  The tip of the right IJ central line is just superior to the confluence of the brachiocephalic veins.  Nasogastric tube is unchanged.  Low volumes with basilar atelectasis.  No airspace disease.  Cardiopericardial silhouette within normal limits for projection and inspiration.  IMPRESSION:  1.  Reduction of redundant loop from right IJ central line. 2.  Other support apparatus stable. 3.  Low volumes with basilar atelectasis.   Original Report Authenticated By: Dereck Ligas, M.D.    Dg Chest Port 1 View  02/16/2012  *RADIOLOGY REPORT*  Clinical Data: ET tube placement.  PORTABLE CHEST - 1 VIEW  Comparison: PA and lateral  chest 02/08/2012.  Findings: NG tube is in place with the tip well positioned in the stomach.  Endotracheal tube is also identified with the tip 2.1 cm above the carina.  Right IJ catheter is looped in the inferior vena cava with the tip projecting retrograde near the right subclavian vein.  No pneumothorax is identified.  Mild atelectasis in the right lung base is noted.  Left lung clear.  Cardiomegaly.  Lucency along the left heart border could be due to pneumopericardium.  IMPRESSION:  1.  ET tube in good position. 2.  Right IJ catheter is  looped in the superior vena cava with the tip projecting retrograde in the right subclavian vein. 3.  Question pneumopericardium.   Original Report Authenticated By: Arvid Right. D'ALESSIO, M.D.    Dg Abd Acute W/chest  02/16/2012  *RADIOLOGY REPORT*  Clinical Data: Abdominal pain and bloating.  Partial colectomy on 02/06/2012.  Hypertension.  Diabetes.  ACUTE ABDOMEN SERIES (ABDOMEN 2 VIEW & CHEST 1 VIEW)  Comparison: 02/11/2012  Findings: Large amount of pneumoperitoneum.  Given the volume, this is abnormal despite the patient having had surgery 10 days ago. Also, the prior pneumoperitoneum has resolved back on 02/11/2012, so this represents new pneumoperitoneum.  The appearance is compatible with a perforated viscus.  Abnormal dilated loops of small bowel noted with scattered air- fluid levels.  Small bowel loops measure up to 5 cm in diameter. Probable obstruction favored over ileus.  Skin clips noted.  Low lung volumes are present on the chest radiograph, with subsegmental atelectasis in the lung bases.  IMPRESSION:  1.  Prominent (and new from 02/11/2012) pneumoperitoneum.  At 10 days out from abdominal surgery, and this amount of pneumoperitoneum is indicative of a perforated viscus.  Emergent surgical consultation recommended. 2.  Bibasilar subsegmental atelectasis. 3.  Air fluid levels in dilated small bowel favoring small bowel obstruction over ileus.  Critical Value/emergent results were called by telephone to Dr. Verl Dicker (who verbally acknowledged these results) at the time of interpretation on 02/16/2012 at 2:08 p.m.   Original Report Authenticated By: Carron Curie, M.D.     Anti-infectives: Anti-infectives     Start     Dose/Rate Route Frequency Ordered Stop   02/16/12 1615   ertapenem (INVANZ) 1 g in sodium chloride 0.9 % 50 mL IVPB        1 g 100 mL/hr over 30 Minutes Intravenous To Surgery 02/16/12 1606 02/16/12 1733   02/16/12 1600   ertapenem (INVANZ) 1 g in sodium chloride  0.9 % 50 mL IVPB        1 g 100 mL/hr over 30 Minutes Intravenous Every 24 hours 02/16/12 1508     02/16/12 1415   cefTRIAXone (ROCEPHIN) 1 g in dextrose 5 % 50 mL IVPB        1 g 100 mL/hr over 30 Minutes Intravenous  Once 02/16/12 1407 02/16/12 1448          Assessment Principal Problem:  Anastomotic leak s/p resection of anastomosis, ileostomy, placement of VAC on 9/15 Active Problems:  Type II diabetes mellitus, well controlled  OBSTRUCTIVE SLEEP APNEA  ARF (acute renal failure)-  Acute respiratory failure with hypoxia  Severe sepsis- on Invanz   LOS: 2 days   Plan: Back to OR today for VAC change +/- closure.   Satcha Storlie J 02/18/2012

## 2012-02-18 NOTE — Op Note (Signed)
Operative Note  [name] male 61 y.o. 02/18/2012  PREOPERATIVE DX:  Open abdominal wound  POSTOPERATIVE DX:  Same  PROCEDURE:  Exploratory laparotomy, abdominal irrigation, application of abdominal VAC.         Surgeon: Odis Hollingshead   Assistants: Lodema Hong PA student  Anesthesia: General endotracheal anesthesia  Indications: This is a 61 year old male who underwent emergency exploratory laparotomy 2 days ago because of a intestinal anastomotic leak. Damage control surgery was performed and the abdomen was left open with a abdominal VAC. He is now brought back to the operating room for second look laparotomy and possible wound closure.    Procedure Detail :  He is brought straight to the intensive care room into the operating room and moved over to the operating table. He was already intubated. Gen. Anesthesia was administered. 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 The area was then sterilely prepped and draped.  The internal portion of the VAC sponge was removed.  Omentum was noted to be adherent to the small bowel and this was separated from the small bowel with careful blunt dissection. I then separated the adhesions between small bowel loops and found some areas of thin brown fluid were evacuated. There is no evidence of intestinal leak. There is no evidence of a frank abscess.  The abdominal cavity was then irrigated out and the fluid evacuated. Maximal neuromuscular blockade was achieved.  Despite this, I was unable to approximate the fascia at this time. Subsequently, the abdominal Vac was reapplied with a good seal. The ileostomy appliance was reapplied. The plan will be to come back in 48 hours for attempted wound closure. He tolerated his procedure well without any apparent complications.   Estimated Blood Loss:  Minimal         Drains: none  Blood Given: none          Specimens: none          Complications:  * No complications entered in OR log *         Disposition: PACU - hemodynamically stable.         Condition: stable

## 2012-02-18 NOTE — Preoperative (Signed)
Beta Blockers   Reason not to administer Beta Blockers:Not Applicable 

## 2012-02-19 ENCOUNTER — Encounter (HOSPITAL_COMMUNITY): Payer: Self-pay | Admitting: General Surgery

## 2012-02-19 ENCOUNTER — Inpatient Hospital Stay (HOSPITAL_COMMUNITY): Payer: Medicaid Other

## 2012-02-19 ENCOUNTER — Encounter (INDEPENDENT_AMBULATORY_CARE_PROVIDER_SITE_OTHER): Payer: PRIVATE HEALTH INSURANCE | Admitting: Surgery

## 2012-02-19 LAB — GLUCOSE, CAPILLARY
Glucose-Capillary: 100 mg/dL — ABNORMAL HIGH (ref 70–99)
Glucose-Capillary: 113 mg/dL — ABNORMAL HIGH (ref 70–99)
Glucose-Capillary: 141 mg/dL — ABNORMAL HIGH (ref 70–99)
Glucose-Capillary: 120 mg/dL — ABNORMAL HIGH (ref 70–99)
Glucose-Capillary: 143 mg/dL — ABNORMAL HIGH (ref 70–99)

## 2012-02-19 LAB — POCT I-STAT 3, ART BLOOD GAS (G3+)
Acid-base deficit: 5 mmol/L — ABNORMAL HIGH (ref 0.0–2.0)
Bicarbonate: 18.8 mEq/L — ABNORMAL LOW (ref 20.0–24.0)
O2 Saturation: 99 %
Patient temperature: 99.4
TCO2: 20 mmol/L (ref 0–100)
pCO2 arterial: 31.8 mmHg — ABNORMAL LOW (ref 35.0–45.0)
pH, Arterial: 7.382 (ref 7.350–7.450)
pO2, Arterial: 159 mmHg — ABNORMAL HIGH (ref 80.0–100.0)

## 2012-02-19 LAB — BASIC METABOLIC PANEL
BUN: 38 mg/dL — ABNORMAL HIGH (ref 6–23)
CO2: 18 mEq/L — ABNORMAL LOW (ref 19–32)
Calcium: 6.9 mg/dL — ABNORMAL LOW (ref 8.4–10.5)
Chloride: 113 mEq/L — ABNORMAL HIGH (ref 96–112)
Creatinine, Ser: 1.6 mg/dL — ABNORMAL HIGH (ref 0.50–1.35)
GFR calc Af Amer: 52 mL/min — ABNORMAL LOW (ref >90)
GFR calc non Af Amer: 45 mL/min — ABNORMAL LOW (ref >90)
Glucose, Bld: 118 mg/dL — ABNORMAL HIGH (ref 70–99)
Potassium: 4.4 mEq/L (ref 3.5–5.1)
Sodium: 141 mEq/L (ref 135–145)

## 2012-02-19 LAB — MAGNESIUM: Magnesium: 2.1 mg/dL (ref 1.5–2.5)

## 2012-02-19 LAB — PROCALCITONIN
Procalcitonin: 9.73 ng/mL
Procalcitonin: 8.2 ng/mL

## 2012-02-19 LAB — PHOSPHORUS: Phosphorus: 3.3 mg/dL (ref 2.3–4.6)

## 2012-02-19 LAB — LACTIC ACID, PLASMA: Lactic Acid, Venous: 1.3 mmol/L (ref 0.5–2.2)

## 2012-02-19 IMAGING — CR DG CHEST 1V PORT
1 series · 1 of 1 positions shown · non-contrast
Comparison: [DATE] and [DATE].

CLINICAL DATA: Open abdominal wound status post exploratory
laparotomy.

PORTABLE CHEST - 1 VIEW

[AP]
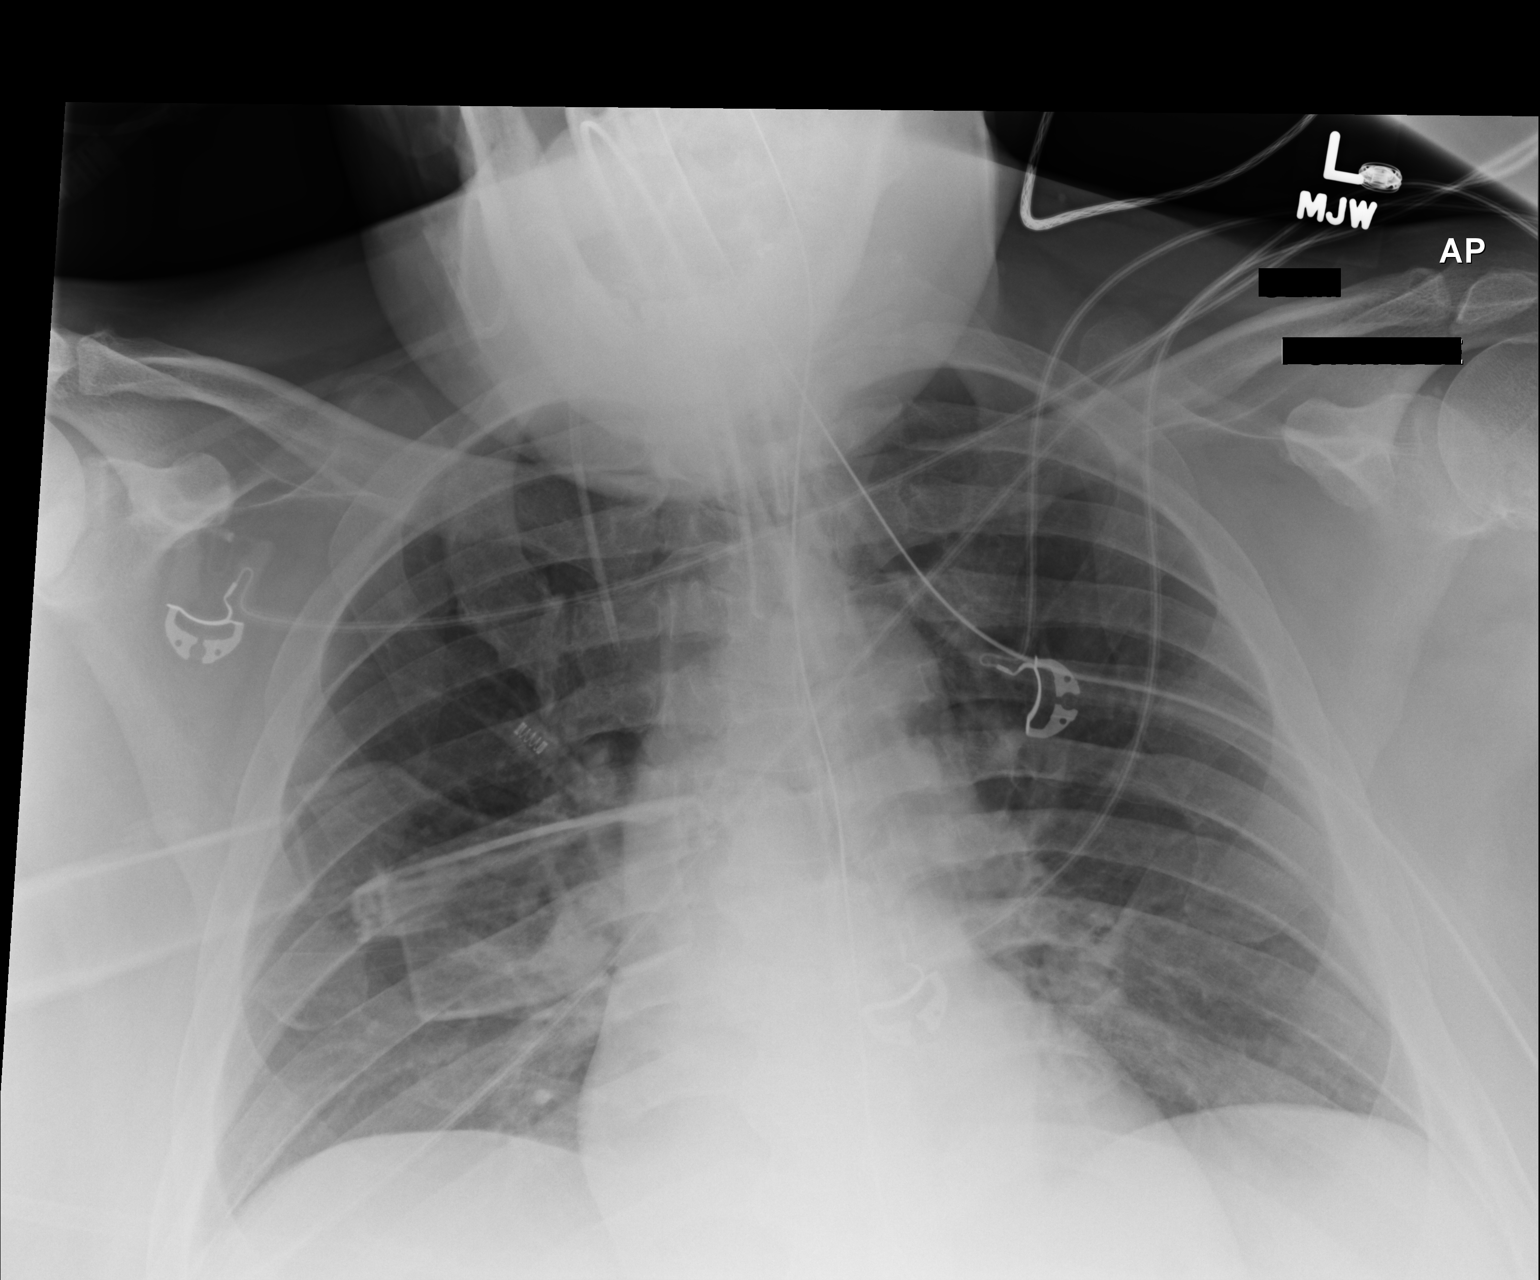

[1 of 1 positions shown; findings below may reference images not displayed]

FINDINGS: [SS] hours.  The endotracheal tube, right IJ central
venous catheter and nasogastric tube positions are unchanged.
Overall lung aeration has improved with mild residual bibasilar
atelectasis.  There is no confluent airspace opacity, pneumothorax
or significant pleural effusion.  The heart size and mediastinal
contours are stable.
IMPRESSION: Interval improved pulmonary aeration.  Stable support system.

## 2012-02-19 MED ORDER — HALOPERIDOL LACTATE 5 MG/ML IJ SOLN
5.0000 mg | Freq: Four times a day (QID) | INTRAMUSCULAR | Status: DC
  Administered 2012-02-19 – 2012-02-22 (×11): 5 mg via INTRAVENOUS
  Filled 2012-02-19 (×20): qty 1

## 2012-02-19 MED ORDER — VANCOMYCIN HCL 1000 MG IV SOLR
2000.0000 mg | Freq: Once | INTRAVENOUS | Status: AC
  Administered 2012-02-19: 2000 mg via INTRAVENOUS
  Filled 2012-02-19: qty 2000

## 2012-02-19 MED ORDER — DEXMEDETOMIDINE HCL IN NACL 200 MCG/50ML IV SOLN
0.2000 ug/kg/h | INTRAVENOUS | Status: DC
  Administered 2012-02-19 (×2): 0.4 ug/kg/h via INTRAVENOUS
  Administered 2012-02-19: 0.5 ug/kg/h via INTRAVENOUS
  Administered 2012-02-19: 0.7 ug/kg/h via INTRAVENOUS
  Administered 2012-02-20: 0.5 ug/kg/h via INTRAVENOUS
  Administered 2012-02-20: 0.7 ug/kg/h via INTRAVENOUS
  Administered 2012-02-20: 0.4 ug/kg/h via INTRAVENOUS
  Administered 2012-02-20 (×3): 0.7 ug/kg/h via INTRAVENOUS
  Administered 2012-02-20: 0.5 ug/kg/h via INTRAVENOUS
  Filled 2012-02-19 (×11): qty 50

## 2012-02-19 MED ORDER — VANCOMYCIN HCL 1000 MG IV SOLR
1500.0000 mg | INTRAVENOUS | Status: AC
  Administered 2012-02-20 – 2012-02-21 (×2): 1500 mg via INTRAVENOUS
  Filled 2012-02-19 (×2): qty 1500

## 2012-02-19 MED ORDER — MIDAZOLAM HCL 2 MG/2ML IJ SOLN: 2.0000 mg | Freq: Once | INTRAMUSCULAR | Status: AC

## 2012-02-19 MED ORDER — PIPERACILLIN-TAZOBACTAM 3.375 G IVPB
3.3750 g | Freq: Three times a day (TID) | INTRAVENOUS | Status: DC
  Administered 2012-02-19 – 2012-03-01 (×32): 3.375 g via INTRAVENOUS
  Filled 2012-02-19 (×36): qty 50

## 2012-02-19 MED ORDER — MIDAZOLAM HCL 2 MG/2ML IJ SOLN
1.0000 mg | INTRAMUSCULAR | Status: DC | PRN
  Administered 2012-02-19 – 2012-02-28 (×30): 2 mg via INTRAVENOUS
  Filled 2012-02-19 (×16): qty 2
  Filled 2012-02-19: qty 4
  Filled 2012-02-19 (×6): qty 2
  Filled 2012-02-19: qty 4
  Filled 2012-02-19: qty 6
  Filled 2012-02-19 (×10): qty 2

## 2012-02-19 NOTE — Progress Notes (Signed)
ANTIBIOTIC CONSULT NOTE - INITIAL  Pharmacy Consult for Vancomycin and Zosyn Indication: Peritonitis; suspected PNA  No Known Allergies  Patient Measurements: Height: 5\' 11"  (180.3 cm) Weight: 240 lb 11.9 oz (109.2 kg) IBW/kg (Calculated) : 75.3   Vital Signs: Temp: 101.8 F (38.8 C) (09/18 0738) Temp src: Oral (09/18 0738) BP: 117/67 mmHg (09/18 0800) Pulse Rate: 90  (09/18 0800) Intake/Output from previous day: 09/17 0701 - 09/18 0700 In: 3529.4 [I.V.:3089.4; NG/GT:180; IV Piggyback:260] Out: [id number] [Urine:1805; Emesis/NG output:400; Drains:875; Stool:50] Intake/Output from this shift: Total I/O In: 143.8 [I.V.:113.8; NG/GT:30] Out: 325 [Urine:125; Emesis/NG output:100; Drains:100]  Labs:  Basename 02/19/12 0406 02/18/12 0405 02/17/12 1413 02/17/12 0420 02/16/12 1935  WBC -- 15.6* -- 16.2* 12.5*  HGB -- 9.6* -- 9.9* 10.4*  PLT -- 367 -- 349 355  LABCREA -- -- -- -- --  CREATININE 1.60* 2.24* 2.28* -- --   Estimated Creatinine Clearance: 61.7 ml/min (by C-G formula based on Cr of 1.6). No results found for this basename: VANCOTROUGH:2,VANCOPEAK:2,VANCORANDOM:2,GENTTROUGH:2,GENTPEAK:2,GENTRANDOM:2,TOBRATROUGH:2,TOBRAPEAK:2,TOBRARND:2,AMIKACINPEAK:2,AMIKACINTROU:2,AMIKACIN:2, in the last 72 hours   Microbiology: Recent Results (from the past 720 hour(s))  SURGICAL PCR SCREEN     Status: Normal   Collection Time   01/30/12 10:16 AM      Component Value Range Status Comment   MRSA, PCR NEGATIVE  NEGATIVE Final    Staphylococcus aureus NEGATIVE  NEGATIVE Final   MRSA PCR SCREENING     Status: Normal   Collection Time   02/16/12  6:43 PM      Component Value Range Status Comment   MRSA by PCR NEGATIVE  NEGATIVE Final   URINE CULTURE     Status: Normal   Collection Time   02/16/12  8:10 PM      Component Value Range Status Comment   Specimen Description URINE, CATHETERIZED   Final    Special Requests NONE   Final    Culture  Setup Time 02/16/2012 20:46   Final    Colony Count NO GROWTH   Final    Culture NO GROWTH   Final    Report Status 02/18/2012 FINAL   Final     Medical History: Past Medical History  Diagnosis Date  . Hyperlipidemia   . Hypertension   . Heart murmur   . Diabetes mellitus     type 2 IDDM x 15 years  . Sleep apnea     does not wear CPAP now  . Colon polyp   . Arthritis     left knee  . 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 12/25/2011    Medications:  Prescriptions prior to admission  Medication Sig Dispense Refill  . atorvastatin (LIPITOR) 20 MG tablet Take 20 mg by mouth at bedtime.      . carvedilol (COREG) 6.25 MG tablet Take 1 tablet (6.25 mg total) by mouth 2 (two) times daily with a meal.  60 tablet  0  . insulin NPH-insulin regular (NOVOLIN 70/30) (70-30) 100 UNIT/ML injection Inject 10-30 Units into the skin 2 (two) times daily with a meal. Inject 30 units in the morning and 10 units at bedtime.      . metFORMIN (GLUCOPHAGE) 500 MG tablet Take 1,000 mg by mouth 2 (two) times daily with a meal.      . oxyCODONE-acetaminophen (PERCOCET/ROXICET) 5-325 MG per tablet Take 1-2 tablets by mouth every 6 (six) hours as needed.  30 tablet  0   Assessment: 61 y.o. male admitted 9/15 for emergent exp lap which found  anastomotic leak and required resection of anastomosis, ileostomy, VAC placement. Pt has been on Invanz since admit. Now to broaden antibiotics to Vancomycin and Zosyn to cover suspected HCAP as well. Tm 101.8. WBC elevated but trending down. Pan cx pending. CrCl ~50 ml/min.  Goal of Therapy:  Vancomycin trough level 15-20 mcg/ml  Plan:  1. Zosyn 3.375gm IV q8h. Each dose over 4 hours 2. Vancomycin 2 gm IV x 1 now then 1500mg  IV q24h. 3. Will f/u microbiological data, renal function, and clinical condition 4. Will check trough at [facility].  Sherlon Handing, PharmD, BCPS Clinical pharmacist, pager [phone] 02/19/2012,9:25 AM

## 2012-02-19 NOTE — Progress Notes (Signed)
Name: [name] MRN: [id number] DOB: [birth year]    LOS: 3  Referring Provider:  [name] of [facility] Reason for Referral:  Post op resp failure, metabolic acidosis and hypotension PCP is [name], MD  Hx obtained from chart extraction in epic. Patient unable to provide hx  PULMONARY / CRITICAL CARE MEDICINE  HPI:  Patient is a 61 year old gentleman smoker, OSA on cpap, Obese, who underwent colonoscopy by [name] and was found to have a 3-4 cm sessile mass the distal transverse colon. 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 On 02/06/12 underwent laparoscopic-assisted mobilization splenic flexure, right hemicolectomy with primary anastomosis.  Postop had acute renal failure with postoperative hypovolemia, which resolved. He also had a postop ileus. He was discharged on 02/13/12. He had some difficulty getting out of bed before discharge became worse on Friday, 02/14/12. He was unable to eat more than a few bites. Saturday 02/15/12, he had increasing abdominal pain ongoing nausea some vomiting again he was unable to eat.  Then on 02/16/12 presented with  large amount of pneumoperitoneum felt to be extremely large even having had surgery several days. There was a normally dilated loops of small bowel with scattered air-fluid levels bowel loops measuring up to 5 cm in diameter. Concerning for obstruction and possible perforation.   Labs show normal white count creatinine was up to 2.26 and  CO2 is 15. EKG shows some inferior changes, but nonspecific.  He then on  02/16/12 underwent emergent  exploratory laparotomy and found to have anastomitic leak (not perforated viscus) and s/p resection ileocolonic anastamosis and ileostomy and application of wound vac. Operative course complicated by hypotension and blood loss needing 2U PRBC . Postoperative remained vent dependent and abg showed metabolic acidosis with somewhat poor resp compensation. BP soft wit MAP 64. PCCM consulted  02/16/2012 for med mgmt   DEVICES ETT 02/16/2012 Rt IJ 02/16/2012 A line 02/16/2012 >>02/19/12   Events Since Admission: 02/16/2012 > P lap. Admit. VDRF. ATN. Lactic acidosis. Periotinitis. P lap due to anast leak 02/17/12: - 40% fio2/peep 5. Swedation wean results in c/o pain and related agitatiion. Making urine but at 20cc/h. Pressor dependent  02/18/12: Pain still an issue with resultant agitation - needing fent gtt and lot of versed boluses. Lot of fluid boluses fro CVP >10 but < 12. Levophed need worse to 80mcg. Plans for OR today for wound wash out. FEbrile once and on scheduled tylenol now 9/17 -  unable to close fascia because of bowel edema and significant increase in peak airway pressure    SUBJECTIVE/OVERNIGHT/INTERVAL HX  Improving pressor need, Making urine 150cc/h Agitated on WUA - now with delirium + FEbrile again. Has resp secretions via ET tube   Current Status: Critical  Vital Signs: Temp:  [97.7 F (36.5 C)-101.8 F (38.8 C)] 101.8 F (38.8 C) (09/18 0738) Pulse Rate:  [46-90] 90  (09/18 0800) Resp:  [15-26] 22  (09/18 0800) BP: (83-158)/(40-98) 117/67 mmHg (09/18 0800) SpO2:  [100 %] 100 % (09/18 0800) Arterial Line BP: (53-105)/(44-83) 70/62 mmHg (09/18 0800) FiO2 (%):  [29.8 %-50 %] 40.2 % (09/18 0800) Weight:  [109.2 kg (240 lb 11.9 oz)] 109.2 kg (240 lb 11.9 oz) (09/18 0500)  Physical Examination: General:  Obese male. On vent. Looks critically ill Neuro:  RASS +2. Not tracking. PEriodically agitated HEENT:  ETT tube + Neck:  Soft, R IJ + Cardiovascular:  HR 104. MAP 70, S1S2+ Lungs:  PRVC. No vent dysnchrony Abdomen:  Obese, BOWEL SOUNDS reduced to nil. Wound VAC + Musculoskeletal:  No cyanosis. No clubbing. No edema Skin:  Intact  Dg Chest Port 1 View  02/19/2012  *RADIOLOGY REPORT*  Clinical Data: Open abdominal wound status post exploratory laparotomy.  PORTABLE CHEST - 1 VIEW  Comparison: 02/18/2012 and 02/17/2012.  Findings: 0634 hours.  The  endotracheal tube, right IJ central venous catheter and nasogastric tube positions are unchanged. Overall lung aeration has improved with mild residual bibasilar atelectasis.  There is no confluent airspace opacity, pneumothorax or significant pleural effusion.  The heart size and mediastinal contours are stable.  IMPRESSION: Interval improved pulmonary aeration.  Stable support system.   Original Report Authenticated By: Vivia Ewing, M.D.    Dg Chest Port 1 View  02/18/2012  *RADIOLOGY REPORT*  Clinical Data: Check endotracheal tube position  PORTABLE CHEST - 1 VIEW  Comparison: 02/17/2012  Findings: Cardiomediastinal silhouette is stable.  Stable NG tube position.  Endotracheal tube in place with tip 3.8 cm above the carina.  Stable right IJ central line position.  No acute infiltrate or pulmonary edema.  Poor inspiration with mild basilar atelectasis.  IMPRESSION: Stable support apparatus.  Mild basilar atelectasis.   Original Report Authenticated By: Lahoma Crocker, M.D.      Principal Problem:  *Peritonitis Active Problems:  ARF (acute renal failure)  Acute respiratory failure with hypoxia  Acidosis  Hypotension  Severe sepsis  Type II diabetes mellitus, well controlled  OBSTRUCTIVE SLEEP APNEA  Obesity (BMI 30.0-34.9)  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   ASSESSMENT AND PLAN  PULMONARY  Lab 02/19/12 0416 02/18/12 0411 02/17/12 0850 02/17/12 0423 02/16/12 2057 02/16/12 1825  PHART 7.382 7.310* -- 7.328* 7.325* 7.285*  PCO2ART 31.8* 30.5* -- 31.0* 31.1* 35.9  PO2ART 159.0* 126.0* -- 193.0* 123.0* 67.0*  HCO3 18.8* 15.4* -- 16.4* 16.3* 17.2*  O2SAT 99.0 99.0 76.0 100.0 99.0 --   Ventilator Settings: Vent Mode:  [-] PRVC FiO2 (%):  [29.8 %-50 %] 40.2 % Set Rate:  [18 bmp] 18 bmp Vt Set:  [600 mL] 600 mL PEEP:  [5 cmH20] 5 cmH20 Plateau Pressure:  [19 cmH20-21 cmH20] 20 cmH20   A:  Acute post operative respiratory failure 02/16/2012 . ABG has poor resp  compensation to metab acidosis in immediate post op period  - on 02/19/2012: Does not meet SBT criteria due pressor dependence, agitated delirium, planned trip to OR 0000000 though metabolic acidosis improved on bicarb gtt   P:   Full vent support   Serial cxr and abg  CARDIOVASCULAR  Lab 02/17/12 0420 02/16/12 1935 02/16/12 1504 02/16/12 1450  TROPONINI -- -- <0.30 --  LATICACIDVEN 1.3 2.0 -- 3.4*  PROBNP -- -- -- --    A: Hypotension with high lactate and high PCT and clinical setting = Suggestive of occult septic shock  - on 02/17/12: In septic shock with CVP 11 and on levophed 76mcg but lactate has cleared. SCvo2 70s  -- on 02/19/12: levophed need down to 5mcg  P:  CVP monitoring - fluids if < 10 (threshold lowered from 12 to 10) Pressors for MAP > 65 No steropids in view of plap  RENAL  Lab 02/19/12 0406 02/18/12 0405 02/17/12 1413 02/17/12 0420 02/16/12 1935  NA 141 140 139 138 138  K 4.4 4.6 -- -- --  CL 113* 115* 113* 112 108  CO2 18* 16* 16* 15* 17*  BUN 38* 46* 42* 37* 32*  CREATININE 1.60* 2.24* 2.28* 2.40* 2.32*  CALCIUM 6.9* 6.5* 6.6* 6.5* 7.2*  MG 2.1 1.6 -- 1.4* --  PHOS 3.3 4.2 -- 5.4* --   Intake/Output      09/17 0701 - 09/18 0700 09/18 0701 - 09/19 0700   I.V. (mL/kg) 3089.4 (28.3) 113.8 (1)   NG/GT 180 30   IV Piggyback 260    Total Intake(mL/kg) 3529.4 (32.3) 143.8 (1.3)   Urine (mL/kg/hr) 1805 (0.7) 125   Emesis/NG output 400 100   Drains 875 100   Stool 50    Total Output 3130 325   Net +399.4 -181.2         Foley: 02/16/2012   A:  Acute renal fialure likely due to peritionitis. Sepsis  and volume depltion at admit 02/16/2012  - on 02/19/2012: creatinine improved significantly.  Acidosis improving too  P Continue bicarb replacement since 02/18/12 but reduce rate 02/19/12 Monitor BMET and lytes cvp goal > 10 and MAP goal > 65   GASTROINTESTINAL  Lab 02/16/12 1258  AST 32  ALT 25  ALKPHOS 66  BILITOT 1.1  PROT 6.8  ALBUMIN 2.6*     A:  Acute anastomotic leak pertionitis at admit 02/16/2012  - on 02/17/12: Lot of pain. Some bowel sounds +  - on 02/18/12:  Unable to close due to edema and high PIP  - on 02/19/12:  Sluggish bowel sounds. Plan for OR trip 9/19 P:   Per [facility]  HEMATOLOGIC  Lab 02/18/12 0405 02/17/12 0420 02/16/12 1935 02/16/12 1711 02/16/12 1504 02/16/12 1236  HGB 9.6* 9.9* 10.4* 7.5* -- 10.1*  HCT 26.7* 27.9* 29.3* 22.0* -- 28.7*  PLT 367 349 355 -- -- 578*  INR -- -- -- -- 1.32 --  APTT -- -- -- -- 33 --   A:  S/p 2u prbc in OR for blood loss on 02/16/2012 - on 02/19/2012: HGb profile and suggestive of anemia of critical illness  P:  - PRBC for hgb </= 6.9gm%     - exceptions are   -  if ACS susepcted/confirmed then transfuse for hgb </= 8.0gm%,  or    -  If septic shock first 24h and scvo2 < 70% then transfuse for hgb </= 9.0gm%   - active bleeding with hemodynamic instability, then transfuse regardless of hemoglobin value   At at all times try to transfuse 1 unit prbc as possible with exception of active hemorrhage    INFECTIOUS  Lab 02/19/12 0406 02/18/12 0405 02/17/12 0420 02/16/12 1935 02/16/12 1236  WBC -- 15.6* 16.2* 12.5* 9.2  PROCALCITON 9.73 -- -- 28.65 --   Cultures: Results for orders placed during the hospital encounter of 02/16/12  MRSA PCR SCREENING     Status: Normal   Collection Time   02/16/12  6:43 PM      Component Value Range Status Comment   MRSA by PCR NEGATIVE  NEGATIVE Final   URINE CULTURE     Status: Normal   Collection Time   02/16/12  8:10 PM      Component Value Range Status Comment   Specimen Description URINE, CATHETERIZED   Final    Special Requests NONE   Final    Culture  Setup Time 02/16/2012 20:46   Final    Colony Count NO GROWTH   Final    Culture NO GROWTH   Final    Report Status 02/18/2012 FINAL   Final     Antibiotics: Anti-infectives     Start     Dose/Rate Route Frequency Ordered  Stop   02/16/12 1615   ertapenem (INVANZ) 1 g in  sodium chloride 0.9 % 50 mL IVPB        1 g 100 mL/hr over 30 Minutes Intravenous To Surgery 02/16/12 1606 02/16/12 1733   02/16/12 1600   ertapenem (INVANZ) 1 g in sodium chloride 0.9 % 50 mL IVPB        1 g 100 mL/hr over 30 Minutes Intravenous Every 24 hours 02/16/12 1508     02/16/12 1415   cefTRIAXone (ROCEPHIN) 1 g in dextrose 5 % 50 mL IVPB        1 g 100 mL/hr over 30 Minutes Intravenous  Once 02/16/12 1407 02/16/12 1448           A:  Peritonitis. SEvere sepsis/septic shock - on 02/19/12: still febrile again. Increased resp secretions P:   DC Invanz Pan culture REcheck sepsis biomarkers Vanc and Zosyn to start 02/19/12  ENDOCRINE  Lab 02/19/12 0744 02/19/12 0404 02/18/12 2340 02/18/12 1958 02/18/12 1549  GLUCAP 113* 100* 106* 125* 141*   A:  Known DM   P:   ICU hyperglycemia protocol  NEUROLOGIC  A: Pain main issue on POD#3 on 02/19/2012. Also with Agitated delirum  P:   Pain control with fent gtt Haldol scheduled start 02/19/12 REduce versed prn dosage Use fent and versed prn (RN asked to use more fent insetead of versed)  BEST PRACTICE / DISPOSITION Level of Care:  ICU Primary Service:  [facility] Consultants:  PCCM Code Status:  Full Diet:  NPO DVT Px:  SCD GI Px:  Protonix Skin Integrity:  Intact Social / Family:  None at bedside     The patient is critically ill with multiple organ systems failure and requires high complexity decision making for assessment and support, frequent evaluation and titration of therapies, application of advanced monitoring technologies and extensive interpretation of multiple databases.   Critical Care Time devoted to patient care services described in this note is  35  Minutes.  Dr. Brand Males, M.D., [facility].C.P Pulmonary and Critical Care Medicine Staff Physician [facility] Pager: [phone], If no answer or between  15:00h - 7:00h: call [phone]  02/19/2012 8:57  AM

## 2012-02-19 NOTE — Progress Notes (Addendum)
At 1400 patient developed an irregular sinus atrial rhythm of unknown etiology. NP Marni Griffon made aware, no new orders as long as patient remains hemodynamically stable. Given "okay" to continue Precedex. Will continue to monitor.   Wayland Denis RN

## 2012-02-19 NOTE — Anesthesia Postprocedure Evaluation (Signed)
  Anesthesia Post-op Note  Patient: [name]  Procedure(s) Performed: Procedure(s) (LRB) with comments: EXPLORATORY LAPAROTOMY (N/A) - exploratory laparotomy  change of abdominal vac dressing DRESSING CHANGE UNDER ANESTHESIA (N/A)  Patient Location: PACU and SICU  Anesthesia Type: General  Level of Consciousness: sedated  Airway and Oxygen Therapy: Patient remains intubated per anesthesia plan  Post-op Pain: mild  Post-op Assessment: Post-op Vital signs reviewed, Patient's Cardiovascular Status Stable and No signs of Nausea or vomiting  Post-op Vital Signs: Reviewed and stable  Complications: No apparent anesthesia complications

## 2012-02-19 NOTE — Progress Notes (Addendum)
Nutrition Follow-up  Intervention:    Recommend TPN vs EN initiation as medically appropriate  -- if EN started, recommend initiating Vital 1.2 formula at 15 ml/hr and increase by 10 ml every 4 hours to goal rate of 35 ml/hr with Prostat liquid protein 5 times daily via tube to provide 1508 total kcals, 138 gm protein, 681 ml of free water RD to follow for nutrition care plan  Assessment:   Patient remains intubated on ventilator support MV: 17.2 Temp: 37.8  S/p exploratory laparotomy, abdominal irrigation, application of abdominal VAC 9/17. NGT to LIS. + Fentanyl and Dopamine drips. Noted back to OR tomorrow for re-exploration, VAC change.  Diet Order:  NPO  Meds: Scheduled Meds:   . acetaminophen  1,000 mg Intravenous Q6H  . chlorhexidine  15 mL Mouth/Throat BID  . haloperidol lactate  5 mg Intravenous Q6H  . insulin aspart  0-20 Units Subcutaneous Q4H  . pantoprazole (PROTONIX) IV  40 mg Intravenous QHS  . piperacillin-tazobactam (ZOSYN)  IV  3.375 g Intravenous Q8H  . sodium chloride  3 mL Intravenous Q12H  . vancomycin  1,500 mg Intravenous Q24H  . vancomycin  2,000 mg Intravenous Once  . DISCONTD: ertapenem  1 g Intravenous Q24H   Continuous Infusions:   . fentaNYL infusion INTRAVENOUS 350 mcg/hr (02/19/12 0947)  . norepinephrine (LEVOPHED) Adult infusion 4 mcg/min (02/19/12 0800)  .  sodium bicarbonate infusion 1000 mL 50 mL/hr at 02/19/12 0935   PRN Meds:.fentaNYL, midazolam, sodium chloride, DISCONTD: 0.9 % irrigation (POUR BTL), DISCONTD: midazolam, DISCONTD: ondansetron  Labs:  CMP     Component Value Date/Time   NA 141 02/19/2012 0406   K 4.4 02/19/2012 0406   CL 113* 02/19/2012 0406   CO2 18* 02/19/2012 0406   GLUCOSE 118* 02/19/2012 0406   BUN 38* 02/19/2012 0406   CREATININE 1.60* 02/19/2012 0406   CREATININE 1.12 07/04/2011 1516   CALCIUM 6.9* 02/19/2012 0406   PROT 6.8 02/16/2012 1258   ALBUMIN 2.6* 02/16/2012 1258   AST 32 02/16/2012 1258   ALT 25 02/16/2012  1258   ALKPHOS 66 02/16/2012 1258   BILITOT 1.1 02/16/2012 1258   GFRNONAA 45* 02/19/2012 0406   GFRAA 52* 02/19/2012 0406    Phosphorus  Date Value Range Status  02/19/2012 3.3  2.3 - 4.6 mg/dL Final    Magnesium  Date Value Range Status  02/19/2012 2.1  1.5 - 2.5 mg/dL Final     Intake/Output Summary (Last 24 hours) at 02/19/12 1106 Last data filed at 02/19/12 1000  Gross per 24 hour  Intake 3012.65 ml  Output   3515 ml  Net -502.35 ml    CBG (last 3)   Basename 02/19/12 0744 02/19/12 0404 02/18/12 2340  GLUCAP 113* 100* 106*    Weight Status:  109.2 kg (9/18) -- trending up  Re-estimated needs:  2480 kcals, 140-150 gms protein  Nutrition Dx: Inadequate Oral Intake r/t inability to eat as evidenced by NPO status, ongoing  Goal:  Initiate nutrition support within 24-48 hours of ICU admission if prolonged intubation expected, unmet  Monitor:  TPN vs EN initiation, respiratory status, weight, labs, I/O's  Phillips Odor, RD, LDN Pager #: [phone] After-Hours Pager #: [phone]

## 2012-02-19 NOTE — Progress Notes (Addendum)
1 Day Post-Op  Subjective: On vent. Sedated but opens eyes to voice.  Tolerating Levophed wean.  Objective: Vital signs in last 24 hours: Temp:  [97.7 F (36.5 C)-101.8 F (38.8 C)] 101.8 F (38.8 C) (09/18 0738) Pulse Rate:  [46-88] 88  (09/18 0738) Resp:  [15-26] 26  (09/18 0738) BP: (83-158)/(40-98) 110/58 mmHg (09/18 0700) SpO2:  [100 %] 100 % (09/18 0738) Arterial Line BP: (53-105)/(44-83) 80/60 mmHg (09/18 0700) FiO2 (%):  [29.8 %-50 %] 40 % (09/18 0738) Weight:  [240 lb 11.9 oz (109.2 kg)] 240 lb 11.9 oz (109.2 kg) (09/18 0500) Last BM Date: 02/17/12 (ileostomy active liquid brown stool)  Intake/Output from previous day: 09/17 0701 - 09/18 0700 In: 3529.4 [I.V.:3089.4; NG/GT:180; IV Piggyback:260] Out: 3130 [Urine:1805; Emesis/NG output:400; Drains:875; Stool:50] Intake/Output this shift:    PE: Abd-slight firm, VAC on with thin serosanguinous drainage  Lab Results:   Basename 02/18/12 0405 02/17/12 0420  WBC 15.6* 16.2*  HGB 9.6* 9.9*  HCT 26.7* 27.9*  PLT 367 349   BMET  Basename 02/19/12 0406 02/18/12 0405  NA 141 140  K 4.4 4.6  CL 113* 115*  CO2 18* 16*  GLUCOSE 118* 91  BUN 38* 46*  CREATININE 1.60* 2.24*  CALCIUM 6.9* 6.5*   PT/INR  Basename 02/16/12 1504  LABPROT 16.6*  INR 1.32   Comprehensive Metabolic Panel:    Component Value Date/Time   NA 141 02/19/2012 0406   K 4.4 02/19/2012 0406   CL 113* 02/19/2012 0406   CO2 18* 02/19/2012 0406   BUN 38* 02/19/2012 0406   CREATININE 1.60* 02/19/2012 0406   CREATININE 1.12 07/04/2011 1516   GLUCOSE 118* 02/19/2012 0406   CALCIUM 6.9* 02/19/2012 0406   AST 32 02/16/2012 1258   ALT 25 02/16/2012 1258   ALKPHOS 66 02/16/2012 1258   BILITOT 1.1 02/16/2012 1258   PROT 6.8 02/16/2012 1258   ALBUMIN 2.6* 02/16/2012 1258     Studies/Results: Dg Chest Port 1 View  02/19/2012  *RADIOLOGY REPORT*  Clinical Data: Open abdominal wound status post exploratory laparotomy.  PORTABLE CHEST - 1 VIEW  Comparison:  02/18/2012 and 02/17/2012.  Findings: 0634 hours.  The endotracheal tube, right IJ central venous catheter and nasogastric tube positions are unchanged. Overall lung aeration has improved with mild residual bibasilar atelectasis.  There is no confluent airspace opacity, pneumothorax or significant pleural effusion.  The heart size and mediastinal contours are stable.  IMPRESSION: Interval improved pulmonary aeration.  Stable support system.   Original Report Authenticated By: Vivia Ewing, M.D.    Dg Chest Port 1 View  02/18/2012  *RADIOLOGY REPORT*  Clinical Data: Check endotracheal tube position  PORTABLE CHEST - 1 VIEW  Comparison: 02/17/2012  Findings: Cardiomediastinal silhouette is stable.  Stable NG tube position.  Endotracheal tube in place with tip 3.8 cm above the carina.  Stable right IJ central line position.  No acute infiltrate or pulmonary edema.  Poor inspiration with mild basilar atelectasis.  IMPRESSION: Stable support apparatus.  Mild basilar atelectasis.   Original Report Authenticated By: Lahoma Crocker, M.D.     Anti-infectives: Anti-infectives     Start     Dose/Rate Route Frequency Ordered Stop   02/16/12 1615   ertapenem (INVANZ) 1 g in sodium chloride 0.9 % 50 mL IVPB        1 g 100 mL/hr over 30 Minutes Intravenous To Surgery 02/16/12 1606 02/16/12 1733   02/16/12 1600   ertapenem (INVANZ) 1 g in sodium  chloride 0.9 % 50 mL IVPB        1 g 100 mL/hr over 30 Minutes Intravenous Every 24 hours 02/16/12 1508     02/16/12 1415   cefTRIAXone (ROCEPHIN) 1 g in dextrose 5 % 50 mL IVPB        1 g 100 mL/hr over 30 Minutes Intravenous  Once 02/16/12 1407 02/16/12 1448          Assessment Principal Problem:  Anastomotic leak s/p resection of anastomosis, ileostomy, placement of VAC on 9/15-unable to close fascia yesterday because of bowel edema and significant increase in peak airway pressure when I attempted to approximate the fascia. Active Problems:  Type II diabetes  mellitus, well controlled  OBSTRUCTIVE SLEEP APNEA  ARF (acute renal failure)-improving  Acute respiratory failure with hypoxia  Severe sepsis- on Invanz; weaning Levophed   LOS: 3 days   Plan: Back to OR tomorrow for [facility] change +/- closure.   [name] 02/19/2012

## 2012-02-19 NOTE — Progress Notes (Signed)
Pt is agitated despite max limit on fentanyl gtt, versed pushes and haldol. MD Ramaswamey paged to make aware. New orders received- will monitor.  [name]

## 2012-02-20 ENCOUNTER — Inpatient Hospital Stay (HOSPITAL_COMMUNITY): Payer: Medicaid Other

## 2012-02-20 ENCOUNTER — Inpatient Hospital Stay (HOSPITAL_COMMUNITY): Payer: Medicaid Other | Admitting: Anesthesiology

## 2012-02-20 ENCOUNTER — Inpatient Hospital Stay (HOSPITAL_COMMUNITY): Payer: Medicaid Other | Admitting: Critical Care Medicine

## 2012-02-20 ENCOUNTER — Encounter (HOSPITAL_COMMUNITY): Payer: Self-pay | Admitting: Anesthesiology

## 2012-02-20 ENCOUNTER — Encounter (HOSPITAL_COMMUNITY): Payer: Self-pay | Admitting: Critical Care Medicine

## 2012-02-20 ENCOUNTER — Encounter (HOSPITAL_COMMUNITY)
Admission: EM | Disposition: A | Discharge status: Skilled Nursing Facility | Payer: Self-pay | Source: Home / Self Care | Attending: Surgery

## 2012-02-20 HISTORY — PX: LAPAROTOMY: SHX154

## 2012-02-20 HISTORY — PX: VACUUM ASSISTED CLOSURE CHANGE: SHX5227

## 2012-02-20 LAB — BASIC METABOLIC PANEL
BUN: 30 mg/dL — ABNORMAL HIGH (ref 6–23)
CO2: 23 mEq/L (ref 19–32)
Calcium: 7.2 mg/dL — ABNORMAL LOW (ref 8.4–10.5)
Chloride: 113 mEq/L — ABNORMAL HIGH (ref 96–112)
Creatinine, Ser: 1.33 mg/dL (ref 0.50–1.35)
GFR calc Af Amer: 66 mL/min — ABNORMAL LOW (ref >90)
GFR calc non Af Amer: 57 mL/min — ABNORMAL LOW (ref >90)
Glucose, Bld: 97 mg/dL (ref 70–99)
Potassium: 4.6 mEq/L (ref 3.5–5.1)
Sodium: 143 mEq/L (ref 135–145)

## 2012-02-20 LAB — GLUCOSE, CAPILLARY
Glucose-Capillary: 117 mg/dL — ABNORMAL HIGH (ref 70–99)
Glucose-Capillary: 118 mg/dL — ABNORMAL HIGH (ref 70–99)
Glucose-Capillary: 125 mg/dL — ABNORMAL HIGH (ref 70–99)
Glucose-Capillary: 129 mg/dL — ABNORMAL HIGH (ref 70–99)
Glucose-Capillary: 132 mg/dL — ABNORMAL HIGH (ref 70–99)
Glucose-Capillary: 90 mg/dL (ref 70–99)
Glucose-Capillary: 97 mg/dL (ref 70–99)

## 2012-02-20 LAB — CBC
HCT: 27.6 % — ABNORMAL LOW (ref 39.0–52.0)
Hemoglobin: 9.8 g/dL — ABNORMAL LOW (ref 13.0–17.0)
MCH: 25 pg — ABNORMAL LOW (ref 26.0–34.0)
MCHC: 35.5 g/dL (ref 30.0–36.0)
MCV: 70.4 fL — ABNORMAL LOW (ref 78.0–100.0)
Platelets: 322 10*3/uL (ref 150–400)
RBC: 3.92 MIL/uL — ABNORMAL LOW (ref 4.22–5.81)
RDW: 19.5 % — ABNORMAL HIGH (ref 11.5–15.5)
WBC: 13 10*3/uL — ABNORMAL HIGH (ref 4.0–10.5)

## 2012-02-20 LAB — URINE CULTURE
Colony Count: NO GROWTH
Culture: NO GROWTH
Special Requests: NORMAL

## 2012-02-20 LAB — PHOSPHORUS: Phosphorus: 2.6 mg/dL (ref 2.3–4.6)

## 2012-02-20 LAB — POCT I-STAT 3, ART BLOOD GAS (G3+)
Acid-base deficit: 3 mmol/L — ABNORMAL HIGH (ref 0.0–2.0)
Bicarbonate: 20.5 mEq/L (ref 20.0–24.0)
O2 Saturation: 98 %
Patient temperature: 98.6
TCO2: 21 mmol/L (ref 0–100)
pCO2 arterial: 31.7 mmHg — ABNORMAL LOW (ref 35.0–45.0)
pH, Arterial: 7.418 (ref 7.350–7.450)
pO2, Arterial: 107 mmHg — ABNORMAL HIGH (ref 80.0–100.0)

## 2012-02-20 LAB — MAGNESIUM: Magnesium: 2.3 mg/dL (ref 1.5–2.5)

## 2012-02-20 IMAGING — CR DG CHEST 1V PORT
1 series · 1 of 1 positions shown · non-contrast
Comparison: One view chest [DATE] at [DATE] a.m.

CLINICAL DATA: Status post intubation.

PORTABLE CHEST - 1 VIEW

[AP]
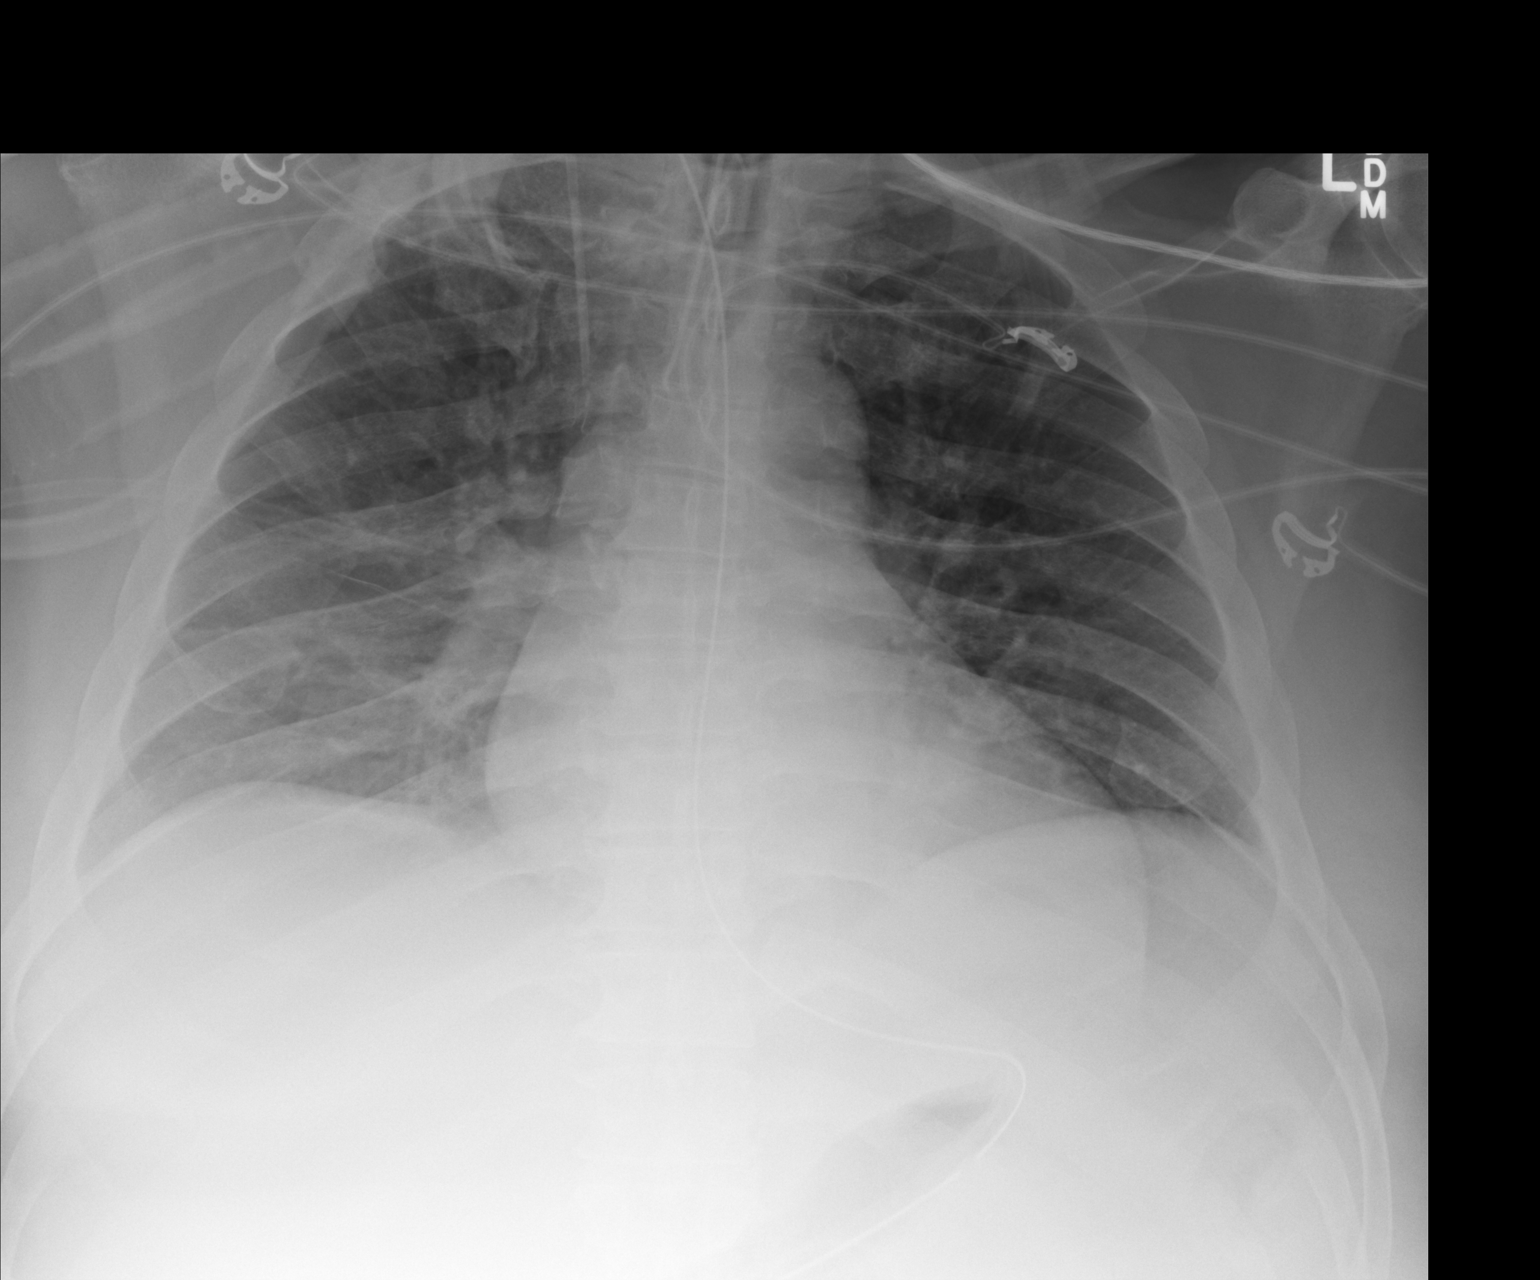

[1 of 1 positions shown; findings below may reference images not displayed]

FINDINGS: Endotracheal tube terminates 2.2 cm above the carina and
could be pulled back 1-2 cm for more optimal positioning.  The
right IJ line and NG tube are stable.  Aeration is slightly
improved.  Lung volumes remain low with mild bibasilar atelectasis.
IMPRESSION: 1.  The endotracheal tube terminates 2.2 cm above the carina and
could be pulled back 1-2 cm for more optimal positioning.
2.  Support apparatus is otherwise stable.
3.  Improved aeration with persistent low lung volumes and right
greater than left basilar atelectasis.

## 2012-02-20 IMAGING — CR DG CHEST 1V PORT
1 series · 1 of 1 positions shown · non-contrast
Comparison: Chest [DATE].

CLINICAL DATA: Intubated patient.

PORTABLE CHEST - 1 VIEW

[AP]
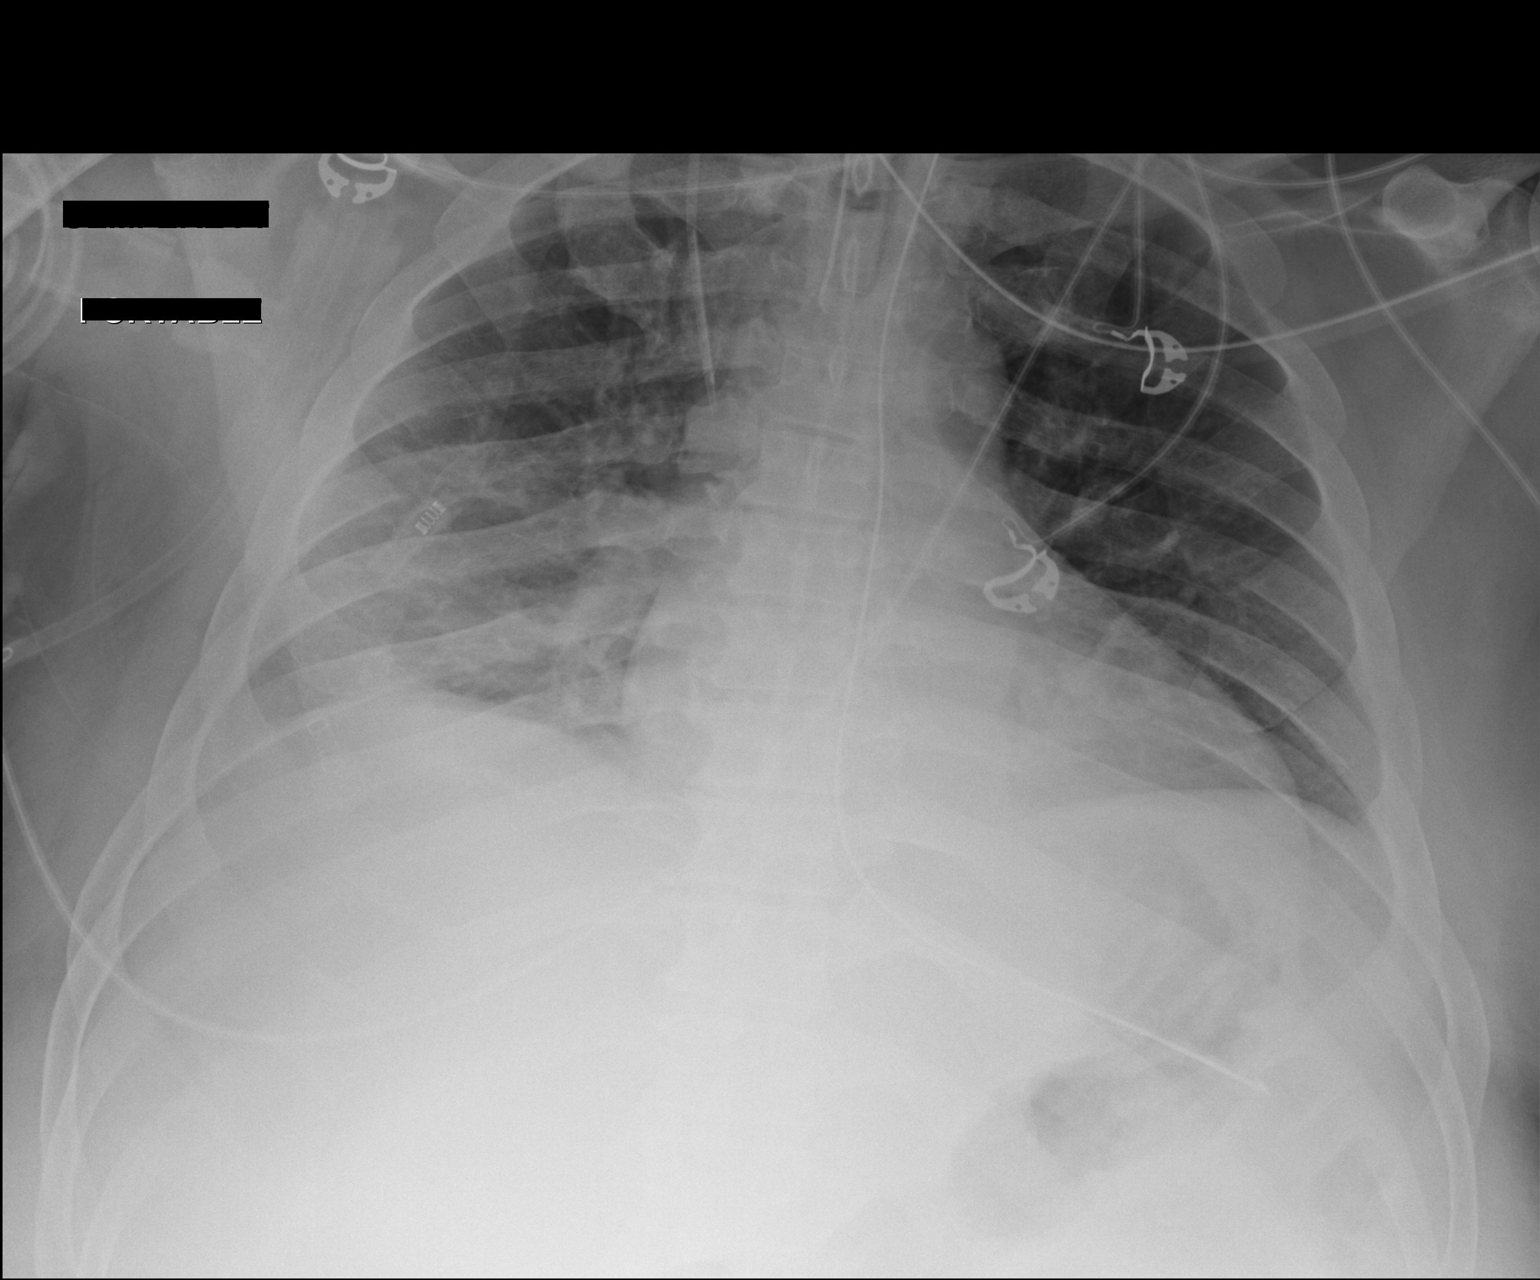

[1 of 1 positions shown; findings below may reference images not displayed]

FINDINGS: Endotracheal tube is in place with tip in good position
approximately 3 cm above the carina.  Right IJ catheter and NG tube
again noted.  There is a new right pleural effusion and basilar
airspace disease.  Left lung remains clear.  No pneumothorax.
Cardiomegaly is noted.
IMPRESSION: 1.  ET tube in good position.
2.  New right pleural effusion with basilar opacity which could be
due to atelectasis or pneumonia.

## 2012-02-20 SURGERY — LAPAROTOMY, EXPLORATORY
Anesthesia: General | Site: Abdomen | Wound class: Dirty or Infected

## 2012-02-20 MED ORDER — EPHEDRINE SULFATE 50 MG/ML IJ SOLN
INTRAMUSCULAR | Status: DC | PRN
  Administered 2012-02-20: 5 mg via INTRAVENOUS
  Administered 2012-02-20 (×2): 10 mg via INTRAVENOUS

## 2012-02-20 MED ORDER — SODIUM CHLORIDE 0.9 % IV SOLN
INTRAVENOUS | Status: DC
  Administered 2012-02-22 – 2012-02-27 (×4): via INTRAVENOUS
  Administered 2012-02-29: 20 mL/h via INTRAVENOUS
  Administered 2012-03-04 – 2012-03-07 (×3): via INTRAVENOUS

## 2012-02-20 MED ORDER — ROCURONIUM BROMIDE 100 MG/10ML IV SOLN
INTRAVENOUS | Status: DC | PRN
  Administered 2012-02-20: 50 mg via INTRAVENOUS

## 2012-02-20 MED ORDER — DEXMEDETOMIDINE HCL IN NACL 400 MCG/100ML IV SOLN
0.2000 ug/kg/h | INTRAVENOUS | Status: DC
  Administered 2012-02-21 – 2012-02-23 (×18): 1.2 ug/kg/h via INTRAVENOUS
  Administered 2012-02-23: 0.6 ug/kg/h via INTRAVENOUS
  Administered 2012-02-23 – 2012-02-24 (×7): 1.2 ug/kg/h via INTRAVENOUS
  Administered 2012-02-24: 1 ug/kg/h via INTRAVENOUS
  Administered 2012-02-24 – 2012-02-27 (×20): 1.2 ug/kg/h via INTRAVENOUS
  Administered 2012-02-28: 1 ug/kg/h via INTRAVENOUS
  Administered 2012-02-28: 1.1 ug/kg/h via INTRAVENOUS
  Filled 2012-02-20 (×57): qty 100

## 2012-02-20 MED ORDER — LACTATED RINGERS IV SOLN
INTRAVENOUS | Status: DC | PRN
  Administered 2012-02-20: 10:00:00 via INTRAVENOUS

## 2012-02-20 MED ORDER — 0.9 % SODIUM CHLORIDE (POUR BTL) OPTIME
TOPICAL | Status: DC | PRN
  Administered 2012-02-20: 1000 mL

## 2012-02-20 SURGICAL SUPPLY — 53 items
BLADE SURG ROTATE 9660 (MISCELLANEOUS) IMPLANT
CANISTER SUCTION 2500CC (MISCELLANEOUS) ×4 IMPLANT
CANISTER WOUND CARE 500ML ATS (WOUND CARE) ×3 IMPLANT
CHLORAPREP W/TINT 26ML (MISCELLANEOUS) ×3 IMPLANT
CLIP TI LARGE 6 (CLIP) IMPLANT
CLIP TI MEDIUM 6 (CLIP) IMPLANT
CLOTH BEACON ORANGE TIMEOUT ST (SAFETY) ×3 IMPLANT
COVER SURGICAL LIGHT HANDLE (MISCELLANEOUS) ×3 IMPLANT
DRAIN HEMOVAC 1/8 X 5 (WOUND CARE) IMPLANT
DRAPE INCISE IOBAN 66X45 STRL (DRAPES) IMPLANT
DRAPE LAPAROSCOPIC ABDOMINAL (DRAPES) ×3 IMPLANT
DRAPE WARM FLUID 44X44 (DRAPE) ×3 IMPLANT
ELECT BLADE 6.5 EXT (BLADE) IMPLANT
ELECT REM PT RETURN 9FT ADLT (ELECTROSURGICAL) ×3
ELECTRODE REM PT RTRN 9FT ADLT (ELECTROSURGICAL) ×2 IMPLANT
EVACUATOR SILICONE 100CC (DRAIN) IMPLANT
GLOVE BIO SURGEON STRL SZ 6.5 (GLOVE) ×1 IMPLANT
GLOVE BIO SURGEON STRL SZ7.5 (GLOVE) ×1 IMPLANT
GLOVE BIOGEL PI IND STRL 7.0 (GLOVE) IMPLANT
GLOVE BIOGEL PI IND STRL 7.5 (GLOVE) ×1 IMPLANT
GLOVE BIOGEL PI IND STRL 8 (GLOVE) ×2 IMPLANT
GLOVE BIOGEL PI INDICATOR 7.0 (GLOVE) ×1
GLOVE BIOGEL PI INDICATOR 7.5 (GLOVE) ×1
GLOVE BIOGEL PI INDICATOR 8 (GLOVE) ×1
GLOVE ECLIPSE 8.0 STRL XLNG CF (GLOVE) ×5 IMPLANT
GOWN STRL NON-REIN LRG LVL3 (GOWN DISPOSABLE) ×7 IMPLANT
KIT BASIN OR (CUSTOM PROCEDURE TRAY) ×3 IMPLANT
KIT COLOSTOMY ILEOSTOMY 2.75 (WOUND CARE) ×1 IMPLANT
KIT ROOM TURNOVER OR (KITS) ×3 IMPLANT
LIGASURE IMPACT 36 18CM CVD LR (INSTRUMENTS) IMPLANT
NS IRRIG 1000ML POUR BTL (IV SOLUTION) ×4 IMPLANT
PACK GENERAL/GYN (CUSTOM PROCEDURE TRAY) ×3 IMPLANT
PAD ARMBOARD 7.5X6 YLW CONV (MISCELLANEOUS) ×6 IMPLANT
SPECIMEN JAR X LARGE (MISCELLANEOUS) ×2 IMPLANT
SPONGE ABDOMINAL VAC ABTHERA (MISCELLANEOUS) ×2 IMPLANT
SPONGE GAUZE 4X4 12PLY (GAUZE/BANDAGES/DRESSINGS) ×1 IMPLANT
SPONGE LAP 18X18 X RAY DECT (DISPOSABLE) ×1 IMPLANT
STAPLER VISISTAT 35W (STAPLE) ×3 IMPLANT
SUCTION POOLE TIP (SUCTIONS) ×1 IMPLANT
SUT CHROMIC GUT AB #0 18 (SUTURE) IMPLANT
SUT ETHILON 3 0 FSL (SUTURE) IMPLANT
SUT PDS AB 1 CTX 36 (SUTURE) ×4 IMPLANT
SUT PROLENE 2 0 SH DA (SUTURE) IMPLANT
SUT SILK 2 0 SH (SUTURE) IMPLANT
SUT VIC AB 2-0 SH 18 (SUTURE) IMPLANT
SUT VIC AB 3-0 SH 18 (SUTURE) ×2 IMPLANT
SUT VICRYL AB 2 0 TIES (SUTURE) ×2 IMPLANT
TOWEL OR 17X24 6PK STRL BLUE (TOWEL DISPOSABLE) ×3 IMPLANT
TOWEL OR 17X26 10 PK STRL BLUE (TOWEL DISPOSABLE) ×3 IMPLANT
TRAY FOLEY CATH 14FRSI W/METER (CATHETERS) ×2 IMPLANT
UNDERPAD 30X30 INCONTINENT (UNDERPADS AND DIAPERS) IMPLANT
WATER STERILE IRR 1000ML POUR (IV SOLUTION) ×3 IMPLANT
YANKAUER SUCT BULB TIP NO VENT (SUCTIONS) ×2 IMPLANT

## 2012-02-20 NOTE — Preoperative (Signed)
Beta Blockers   Reason not to administer Beta Blockers:Not Applicable 

## 2012-02-20 NOTE — Anesthesia Postprocedure Evaluation (Signed)
  Anesthesia Post-op Note  Patient: [name]  Procedure(s) Performed: Procedure(s) (LRB) with comments: EXPLORATORY LAPAROTOMY (N/A) ABDOMINAL VACUUM ASSISTED CLOSURE CHANGE ()  Patient Location: SICU  Anesthesia Type: General  Level of Consciousness: Patient remains intubated per anesthesia plan  Airway and Oxygen Therapy: Patient remains intubated per anesthesia plan  Post-op Pain: none  Post-op Assessment: Post-op Vital signs reviewed and Patient's Cardiovascular Status Stable  Post-op Vital Signs: Reviewed and stable  Complications: No apparent anesthesia complications

## 2012-02-20 NOTE — Progress Notes (Signed)
2 Days Post-Op  Subjective: Sedated on vent.  Opens eyes.  Objective: Vital signs in last 24 hours: Temp:  [98.6 F (37 C)-99.9 F (37.7 C)] 99.5 F (37.5 C) (09/19 0755) Pulse Rate:  [39-89] 62  (09/19 0700) Resp:  [10-23] 20  (09/19 0700) BP: (84-149)/(48-80) 122/71 mmHg (09/19 0700) SpO2:  [99 %-100 %] 100 % (09/19 0700) Arterial Line BP: (68-76)/(58-61) 68/58 mmHg (09/18 1000) FiO2 (%):  [39.7 %-40.3 %] 40.1 % (09/19 0700) Weight:  [238 lb 8.6 oz (108.2 kg)] 238 lb 8.6 oz (108.2 kg) (09/19 0500) Last BM Date: 02/19/12  Intake/Output from previous day: 09/18 0701 - 09/19 0700 In: 3175.1 [I.V.:2450.1; NG/GT:90; IV Piggyback:635] Out: 3090 [id number]; Emesis/NG output:400; Drains:915; Stool:30] Intake/Output this shift: Total I/O In: -  Out: 190 [Urine:40; Drains:150]  PE: Abd-slightly firm, VAC on, ileostomy viable with and small amount of output  Lab Results:   Basename 02/20/12 0445 02/18/12 0405  WBC 13.0* 15.6*  HGB 9.8* 9.6*  HCT 27.6* 26.7*  PLT 322 367   BMET  Basename 02/20/12 0445 02/19/12 0406  NA 143 141  K 4.6 4.4  CL 113* 113*  CO2 23 18*  GLUCOSE 97 118*  BUN 30* 38*  CREATININE 1.33 1.60*  CALCIUM 7.2* 6.9*   PT/INR No results found for this basename: LABPROT:2,INR:2 in the last 72 hours Comprehensive Metabolic Panel:    Component Value Date/Time   NA 143 02/20/2012 0445   K 4.6 02/20/2012 0445   CL 113* 02/20/2012 0445   CO2 23 02/20/2012 0445   BUN 30* 02/20/2012 0445   CREATININE 1.33 02/20/2012 0445   CREATININE 1.12 07/04/2011 1516   GLUCOSE 97 02/20/2012 0445   CALCIUM 7.2* 02/20/2012 0445   AST 32 02/16/2012 1258   ALT 25 02/16/2012 1258   ALKPHOS 66 02/16/2012 1258   BILITOT 1.1 02/16/2012 1258   PROT 6.8 02/16/2012 1258   ALBUMIN 2.6* 02/16/2012 1258     Studies/Results: Dg Chest Port 1 View  02/20/2012  *RADIOLOGY REPORT*  Clinical Data: Intubated patient.  PORTABLE CHEST - 1 VIEW  Comparison: Chest 02/19/2012.  Findings:  Endotracheal tube is in place with tip in good position approximately 3 cm above the carina.  Right IJ catheter and NG tube again noted.  There is a new right pleural effusion and basilar airspace disease.  Left lung remains clear.  No pneumothorax. Cardiomegaly is noted.  IMPRESSION:  1.  ET tube in good position. 2.  New right pleural effusion with basilar opacity which could be due to atelectasis or pneumonia.   Original Report Authenticated By: Arvid Right. Luther Parody, M.D.    Dg Chest Port 1 View  02/19/2012  *RADIOLOGY REPORT*  Clinical Data: Open abdominal wound status post exploratory laparotomy.  PORTABLE CHEST - 1 VIEW  Comparison: 02/18/2012 and 02/17/2012.  Findings: 0634 hours.  The endotracheal tube, right IJ central venous catheter and nasogastric tube positions are unchanged. Overall lung aeration has improved with mild residual bibasilar atelectasis.  There is no confluent airspace opacity, pneumothorax or significant pleural effusion.  The heart size and mediastinal contours are stable.  IMPRESSION: Interval improved pulmonary aeration.  Stable support system.   Original Report Authenticated By: Vivia Ewing, M.D.     Anti-infectives: Anti-infectives     Start     Dose/Rate Route Frequency Ordered Stop   02/20/12 1000   vancomycin (VANCOCIN) 1,500 mg in sodium chloride 0.9 % 500 mL IVPB        1,500  mg 250 mL/hr over 120 Minutes Intravenous Every 24 hours 02/19/12 0942     02/19/12 1000  piperacillin-tazobactam (ZOSYN) IVPB 3.375 g       3.375 g 12.5 mL/hr over 240 Minutes Intravenous 3 times per day 02/19/12 0942     02/19/12 1000   vancomycin (VANCOCIN) 2,000 mg in sodium chloride 0.9 % 500 mL IVPB        2,000 mg 250 mL/hr over 120 Minutes Intravenous  Once 02/19/12 0942 02/19/12 1335   02/16/12 1615   ertapenem (INVANZ) 1 g in sodium chloride 0.9 % 50 mL IVPB        1 g 100 mL/hr over 30 Minutes Intravenous To Surgery 02/16/12 1606 02/16/12 1733   02/16/12 1600    ertapenem (INVANZ) 1 g in sodium chloride 0.9 % 50 mL IVPB  Status:  Discontinued        1 g 100 mL/hr over 30 Minutes Intravenous Every 24 hours 02/16/12 1508 02/19/12 0909   02/16/12 1415   cefTRIAXone (ROCEPHIN) 1 g in dextrose 5 % 50 mL IVPB        1 g 100 mL/hr over 30 Minutes Intravenous  Once 02/16/12 1407 02/16/12 1448          Assessment Principal Problem:  1. s/p expl lap with resection of ileocolonic anastomosis for leak 9/15-VAC on   2. VDRF-per CCM   3. ARF-improviing   LOS: 4 days   Plan: Back to OR today for VAC change and possible fascial closure.   Ganesh Deeg J 02/20/2012

## 2012-02-20 NOTE — Anesthesia Preprocedure Evaluation (Addendum)
Anesthesia Evaluation  Patient identified by MRN, date of birth, ID band Patient awake    Reviewed: Allergy & Precautions, H&P , NPO status , Patient's Chart, lab work & pertinent test results  Airway Mallampati: II  Neck ROM: full    Dental   Pulmonary sleep apnea ,          Cardiovascular hypertension,     Neuro/Psych    GI/Hepatic   Endo/Other  diabetes, Type 2  Renal/GU Renal disease     Musculoskeletal  (+) Arthritis -,   Abdominal   Peds  Hematology   Anesthesia Other Findings   Reproductive/Obstetrics                           Anesthesia Physical Anesthesia Plan  ASA: III  Anesthesia Plan: General   Post-op Pain Management:    Induction: Intravenous  Airway Management Planned: Oral ETT  Additional Equipment:   Intra-op Plan:   Post-operative Plan:   Informed Consent: I have reviewed the patients History and Physical, chart, labs and discussed the procedure including the risks, benefits and alternatives for the proposed anesthesia with the patient or authorized representative who has indicated his/her understanding and acceptance.     Plan Discussed with: CRNA and Surgeon  Anesthesia Plan Comments:         Anesthesia Quick Evaluation                                   Anesthesia Evaluation  Patient identified by MRN, date of birth, ID band Patient unresponsive    Reviewed: Patient's Chart, lab work & pertinent test results, Unable to perform ROS - Chart review only  Airway       Dental   Pulmonary          Cardiovascular     Neuro/Psych    GI/Hepatic   Endo/Other    Renal/GU      Musculoskeletal   Abdominal   Peds  Hematology   Anesthesia Other Findings   Reproductive/Obstetrics                           Anesthesia Physical Anesthesia Plan  ASA: IV  Anesthesia Plan: General   Post-op Pain  Management:    Induction: Inhalational  Airway Management Planned:   Additional Equipment:   Intra-op Plan:   Post-operative Plan: Post-operative intubation/ventilation  Informed Consent: I have reviewed the patients History and Physical, chart, labs and discussed the procedure including the risks, benefits and alternatives for the proposed anesthesia with the patient or authorized representative who has indicated his/her understanding and acceptance.   History available from chart only  Plan Discussed with: Anesthesiologist and Surgeon  Anesthesia Plan Comments:         Anesthesia Quick Evaluation

## 2012-02-20 NOTE — Progress Notes (Signed)
RN entered patient's room and found patient with ET tube 3 cm out and able to vocalize.  RT notified, attempted to advance and retape.  Patient given bolus of 151mcg of Fentanyl before attempt.  Elink notifed.  Found that ET tube was no longer in trachea and oxygen saturation began to fall into high 80s.  Elink RN advised RN in room to notify Anesthesia to intubate.  Anesthesia was then paged.  Dr. Lynford Citizen was then paged.  Anesthesia came to bedside and reintubated with no apparent problems.  Patient's family notified.

## 2012-02-20 NOTE — Op Note (Signed)
Operative Note  [name] male 61 y.o. 02/20/2012  PREOPERATIVE DX:  Open abdomen  POSTOPERATIVE DX:  Same  PROCEDURE:  Reexploration of abdomen with application of VAC         Surgeon: Odis Hollingshead   Assistants: Lodema Hong PA student  Anesthesia: General endotracheal anesthesia  Indications:   This is a 61 year old male with an open abdomen following resection of ileocolonic anastomosis due to anastomotic leak. 2 days ago he is brought to the operating room for attempted fascial closure but he had too much bowel edema  And now is brought back today for attempted abdominal fascial closure and VAC change.      Procedure Detail:  He was brought to the operating room intubated from the intensive care unit. He was placed on the operating room table. A general anesthetic was given. The ileostomy device and the top back sponges were removed. The abdominal wall and abdominal wounds were sterilely prepped and draped.  The internal VAC sponge was removed. 4 quadrant exploration was performed and there is no evidence of abscess. Centrally, the small bowel was very fairly adherent to itself. The small bowel was still significantly edematous. I tried to approximate the fascia inferiorly with a #1 Novafil suture but the suture would not hold.  I then placed the abdominal VAC back into the abdominal cavity and it held suction well.  The ileostomy appliance was applied. He tolerated the procedure well and was taken back to the intensive care unit in critical condition.  We'll need to come back within the next couple days for VAC change and reattempt a fascial closure.  Estimated Blood Loss:  Minimal         Drains: none  Blood Given: none          Specimens: none         Complications:  * No complications entered in OR log *         Disposition: PACU - hemodynamically stable.         Condition: stable

## 2012-02-20 NOTE — Transfer of Care (Signed)
Immediate Anesthesia Transfer of Care Note  Patient: [name]  Procedure(s) Performed: Procedure(s) (LRB) with comments: EXPLORATORY LAPAROTOMY (N/A) ABDOMINAL VACUUM ASSISTED CLOSURE CHANGE ()  Patient Location: SICU  Anesthesia Type: General  Level of Consciousness: sedated and Patient remains intubated per anesthesia plan  Airway & Oxygen Therapy: Patient remains intubated per anesthesia plan  Post-op Assessment: Report given to PACU RN and Post -op Vital signs reviewed and stable  Post vital signs: Reviewed and stable  Complications: No apparent anesthesia complications

## 2012-02-20 NOTE — Progress Notes (Signed)
Pt ETT had come out to aprox 22 @ lip. Advanced ETT back to 24@ lip, which was where it was at previously. BBS heard. Good Expiratory minute ventilation. X-ray pending. Pt remains stable at this time.

## 2012-02-20 NOTE — Progress Notes (Signed)
Name: [name] MRN: [id number] DOB: [birth year]    LOS: 4 days Date of admit: 02/16/2012 PCP is [name], MD   Referring Provider:  [name] of [facility] Reason for Referral:  Post op resp failure, metabolic acidosis and hypotension PCP is [name], MD  Hx obtained from chart extraction in epic. Patient unable to provide hx  PULMONARY / CRITICAL CARE MEDICINE  HPI:  Patient is a 61 year old gentleman smoker, OSA on cpap, Obese, who underwent colonoscopy by [name] and was found to have a 3-4 cm sessile mass the distal transverse colon. 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 On 02/06/12 underwent laparoscopic-assisted mobilization splenic flexure, right hemicolectomy with primary anastomosis.  Postop had acute renal failure with postoperative hypovolemia, which resolved. He also had a postop ileus. He was discharged on 02/13/12. He had some difficulty getting out of bed before discharge became worse on Friday, 02/14/12. He was unable to eat more than a few bites. Saturday 02/15/12, he had increasing abdominal pain ongoing nausea some vomiting again he was unable to eat.  Then on 02/16/12 presented with  large amount of pneumoperitoneum felt to be extremely large even having had surgery several days. There was a normally dilated loops of small bowel with scattered air-fluid levels bowel loops measuring up to 5 cm in diameter. Concerning for obstruction and possible perforation.   Labs show normal white count creatinine was up to 2.26 and  CO2 is 15. EKG shows some inferior changes, but nonspecific.  He then on  02/16/12 underwent emergent  exploratory laparotomy and found to have anastomitic leak (not perforated viscus) and s/p resection ileocolonic anastamosis and ileostomy and application of wound vac. Operative course complicated by hypotension and blood loss needing 2U PRBC . Postoperative remained vent dependent and abg showed metabolic acidosis with  somewhat poor resp compensation. BP soft wit MAP 64. PCCM consulted 02/16/2012 for med mgmt   DEVICES ETT 02/16/2012 Rt IJ 02/16/2012 A line 02/16/2012 >>02/19/12   Events Since Admission: 02/16/2012 > P lap. Admit. VDRF. ATN. Lactic acidosis. Periotinitis. P lap due to anast leak 02/17/12: - 40% fio2/peep 5. Swedation wean results in c/o pain and related agitatiion. Making urine but at 20cc/h. Pressor dependent  02/18/12: Pain still an issue with resultant agitation - needing fent gtt and lot of versed boluses. Lot of fluid boluses fro CVP >10 but < 12. Levophed need worse to 69mcg. Plans for OR today for wound wash out. FEbrile once and on scheduled tylenol now 9/17 -  unable to close fascia because of bowel edema and significant increase in peak airway pressure  9/18: Improving pressor need. Resolved renal failure, Severe agitation in pm - needed precedex  SUBJECTIVE/OVERNIGHT/INTERVAL HX  9/19 - Severe agitation yesterday , STarted scheduled haldol and precedex gtt. Low grade fever +. Back to OR today  Current Status: Critical  Vital Signs: Temp:  [98.6 F (37 C)-99.9 F (37.7 C)] 99.5 F (37.5 C) (09/19 0755) Pulse Rate:  [39-89] 80  (09/19 0800) Resp:  [10-23] 18  (09/19 0800) BP: (84-149)/(48-80) 123/77 mmHg (09/19 0800) SpO2:  [99 %-100 %] 100 % (09/19 0800) Arterial Line BP: (68-76)/(58-61) 68/58 mmHg (09/18 1000) FiO2 (%):  [39.7 %-40.3 %] 40.3 % (09/19 0800) Weight:  [108.2 kg (238 lb 8.6 oz)] 108.2 kg (238 lb 8.6 oz) (09/19 0500)  Physical Examination: General:  Obese male. On vent. Looks critically ill Neuro:  RASS -2. Not tracking. PEriodically agitated HEENT:  ETT tube +  Neck:  Soft, R IJ + Cardiovascular:  HR 104. MAP 70, S1S2+ Lungs:  PRVC. No vent dysnchrony Abdomen:  Obese, BOWEL SOUNDS ++ ACTIVE. Wound VAC + Musculoskeletal:  No cyanosis. No clubbing. No edema Skin:  Intact  Dg Chest Port 1 View  02/20/2012  *RADIOLOGY REPORT*  Clinical Data: Intubated  patient.  PORTABLE CHEST - 1 VIEW  Comparison: Chest 02/19/2012.  Findings: Endotracheal tube is in place with tip in good position approximately 3 cm above the carina.  Right IJ catheter and NG tube again noted.  There is a new right pleural effusion and basilar airspace disease.  Left lung remains clear.  No pneumothorax. Cardiomegaly is noted.  IMPRESSION:  1.  ET tube in good position. 2.  New right pleural effusion with basilar opacity which could be due to atelectasis or pneumonia.   Original Report Authenticated By: Arvid Right. Luther Parody, M.D.    Dg Chest Port 1 View  02/19/2012  *RADIOLOGY REPORT*  Clinical Data: Open abdominal wound status post exploratory laparotomy.  PORTABLE CHEST - 1 VIEW  Comparison: 02/18/2012 and 02/17/2012.  Findings: 0634 hours.  The endotracheal tube, right IJ central venous catheter and nasogastric tube positions are unchanged. Overall lung aeration has improved with mild residual bibasilar atelectasis.  There is no confluent airspace opacity, pneumothorax or significant pleural effusion.  The heart size and mediastinal contours are stable.  IMPRESSION: Interval improved pulmonary aeration.  Stable support system.   Original Report Authenticated By: Vivia Ewing, M.D.      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 ABG has poor resp compensation to metab acidosis in immediate post op period  - on 02/20/2012: Does not meet SBT criteria due pressor dependence, agitated delirium, planned trip to OR 0000000 though metabolic acidosis improved on bicarb gtt   P:   Full vent support   Serial cxr and abg  CARDIOVASCULAR  Lab 02/19/12 0930 02/17/12 0420 02/16/12 1935 02/16/12 1504 02/16/12 1450  TROPONINI -- -- -- <0.30 --  LATICACIDVEN 1.3 1.3 2.0 -- 3.4*  PROBNP -- -- -- -- --    A: Hypotension with high lactate and high PCT and clinical setting = Suggestive of occult septic shock  - on 02/17/12: In septic shock with CVP 11 and on levophed 28mcg but lactate has cleared. SCvo2 70s  -- on 02/20/12: levophed need down to 32mcg  P:  CVP monitoring - fluids if < 10 (threshold lowered from 12 to 10) Pressors for MAP > 65 No steropids in view of plap  RENAL  Lab 02/20/12 0445 02/19/12 0406  02/18/12 0405 02/17/12 1413 02/17/12 0420  NA 143 141 140 139 138  K 4.6 4.4 -- -- --  CL 113* 113* 115* 113* 112  CO2 23 18* 16* 16* 15*  BUN 30* 38* 46* 42* 37*  CREATININE 1.33 1.60* 2.24* 2.28* 2.40*  CALCIUM 7.2* 6.9* 6.5* 6.6* 6.5*  MG 2.3 2.1 1.6 -- 1.4*  PHOS 2.6 3.3 4.2 -- 5.4*   Intake/Output      09/18 0701 - 09/19 0700 09/19 0701 - 09/20 0700   I.V. (mL/kg) 2450.1 (22.6) 105.6 (1)   NG/GT 90    IV Piggyback 635 12.5   Total Intake(mL/kg) 3175.1 (29.3) 118.1 (1.1)   Urine (mL/kg/hr) 1745 (0.7) 40   Emesis/NG output 400    Drains 915 150   Stool 30    Total Output 3090 190   Net +85.1 -71.9         Foley: 02/16/2012   A:  Acute renal fialure likely due to peritionitis. Sepsis  and volume depltion at admit 02/16/2012  - on 02/20/2012: Resolved renal failure and normalized bicarb on bicarb  gtt  P DC bicarb and recheck abg in 2h-5h Monitor BMET and lytes cvp goal > 10 and MAP goal > 65   GASTROINTESTINAL  Lab 02/16/12 1258  AST 32  ALT 25  ALKPHOS 66  BILITOT 1.1  PROT 6.8  ALBUMIN 2.6*    A:  Acute anastomotic leak pertionitis at admit 02/16/2012  - on 02/17/12: Lot of pain. Some bowel sounds +  - on 02/18/12:  Unable to close due to edema and high PIP  - on 02/20/12:  Active bowel sounds +.  Plan for OR trip 9/19 P:   Per [facility]  HEMATOLOGIC  Lab 02/20/12 0445 02/18/12 0405 02/17/12 0420 02/16/12 1935 02/16/12 1711 02/16/12 1504 02/16/12 1236  HGB 9.8* 9.6* 9.9* 10.4* 7.5* -- --  HCT 27.6* 26.7* 27.9* 29.3* 22.0* -- --  PLT 322 367 349 355 -- -- 578*  INR -- -- -- -- -- 1.32 --  APTT -- -- -- -- -- 33 --   A:  S/p 2u prbc in OR for blood loss on 02/16/2012 - on 02/20/2012: HGb profile and suggestive of anemia of critical illness  P:  - PRBC for hgb </= 6.9gm%     - exceptions are   -  if ACS susepcted/confirmed then transfuse for hgb </= 8.0gm%,  or    -  If septic shock first 24h and scvo2 < 70% then transfuse for hgb </= 9.0gm%   - active bleeding with hemodynamic instability, then transfuse regardless of hemoglobin value   At at all times try to transfuse 1 unit prbc as possible with exception of active hemorrhage    INFECTIOUS  Lab 02/20/12 0445 02/19/12 0930 02/19/12 0406 02/18/12 0405 02/17/12 0420 02/16/12 1935 02/16/12 1236  WBC 13.0* -- -- 15.6* 16.2* 12.5* 9.2  PROCALCITON -- 8.20 9.73 -- -- 28.65 --   Cultures: Results for orders placed during the hospital encounter of 02/16/12  MRSA PCR SCREENING     Status: Normal   Collection Time   02/16/12  6:43 PM      Component Value Range Status Comment   MRSA by PCR NEGATIVE  NEGATIVE Final   URINE CULTURE     Status: Normal   Collection Time   02/16/12  8:10 PM      Component Value Range Status Comment   Specimen Description URINE, CATHETERIZED   Final  Special Requests NONE   Final     Culture  Setup Time 02/16/2012 20:46   Final    Colony Count NO GROWTH   Final    Culture NO GROWTH   Final    Report Status 02/18/2012 FINAL   Final   CULTURE, BLOOD (ROUTINE X 2)     Status: Normal (Preliminary result)   Collection Time   02/19/12 11:20 AM      Component Value Range Status Comment   Specimen Description BLOOD RIGHT ARM   Final    Special Requests BOTTLES DRAWN AEROBIC AND ANAEROBIC 10CC   Final    Culture  Setup Time 02/19/2012 15:21   Final    Culture     Final    Value:        BLOOD CULTURE RECEIVED NO GROWTH TO DATE CULTURE WILL BE HELD FOR 5 DAYS BEFORE ISSUING A FINAL NEGATIVE REPORT   Report Status PENDING   Incomplete   CULTURE, BLOOD (ROUTINE X 2)     Status: Normal (Preliminary result)   Collection Time   02/19/12 11:25 AM      Component Value Range Status Comment   Specimen Description BLOOD RIGHT HAND   Final    Special Requests BOTTLES DRAWN AEROBIC AND ANAEROBIC 10CC   Final    Culture  Setup Time 02/19/2012 15:21   Final    Culture     Final    Value:        BLOOD CULTURE RECEIVED NO GROWTH TO DATE CULTURE WILL BE HELD FOR 5 DAYS BEFORE ISSUING A FINAL NEGATIVE REPORT   Report Status PENDING   Incomplete     Antibiotics: Anti-infectives     Start     Dose/Rate Route Frequency Ordered Stop   02/20/12 1000   vancomycin (VANCOCIN) 1,500 mg in sodium chloride 0.9 % 500 mL IVPB        1,500 mg 250 mL/hr over 120 Minutes Intravenous Every 24 hours 02/19/12 0942     02/19/12 1000  piperacillin-tazobactam (ZOSYN) IVPB 3.375 g       3.375 g 12.5 mL/hr over 240 Minutes Intravenous 3 times per day 02/19/12 0942     02/19/12 1000   vancomycin (VANCOCIN) 2,000 mg in sodium chloride 0.9 % 500 mL IVPB        2,000 mg 250 mL/hr over 120 Minutes Intravenous  Once 02/19/12 0942 02/19/12 1335   02/16/12 1615   ertapenem (INVANZ) 1 g in sodium chloride 0.9 % 50 mL IVPB        1 g 100 mL/hr over 30 Minutes Intravenous To Surgery 02/16/12 1606 02/16/12 1733    02/16/12 1600   ertapenem (INVANZ) 1 g in sodium chloride 0.9 % 50 mL IVPB  Status:  Discontinued        1 g 100 mL/hr over 30 Minutes Intravenous Every 24 hours 02/16/12 1508 02/19/12 0909   02/16/12 1415   cefTRIAXone (ROCEPHIN) 1 g in dextrose 5 % 50 mL IVPB        1 g 100 mL/hr over 30 Minutes Intravenous  Once 02/16/12 1407 02/16/12 1448           A:  Peritonitis. SEvere sepsis/septic shock - on 02/19/12: still febrile again. Increased resp secretions. Elevated but improving PCT. Pan cultured and abx changed P:   AWait 02/19/12 Pan culture Vanc and Zosyn since 02/19/12  ENDOCRINE  Lab 02/20/12 0751 02/20/12 0349 02/19/12 2330 02/19/12 1953 02/19/12 1547  GLUCAP 117*  90 129* 143* 120*   A:  Known DM   P:   ICU hyperglycemia protocol  NEUROLOGIC  A: Pain main issue on POD#4 on 02/20/2012. Also with Agitated delirum that is severe. On precedex and scheduled haldol since 02/19/12  P:   Pain control with fent gtt Haldol scheduled since 02/19/12 Precedex since 02/19/12 versed prn dosage Use fent and versed prn (RN asked to use more fent insetead of versed)  BEST PRACTICE / DISPOSITION Level of Care:  ICU Primary Service:  [facility] Consultants:  PCCM Code Status:  Full Diet:  NPO DVT Px:  SCD GI Px:  Protonix Skin Integrity:  Intact Social / Family:  None at bedside but daughter was updated by PCCM on medical issues 02/19/12 at bedside    The patient is critically ill with multiple organ systems failure and requires high complexity decision making for assessment and support, frequent evaluation and titration of therapies, application of advanced monitoring technologies and extensive interpretation of multiple databases.   Critical Care Time devoted to patient care services described in this note is  35  Minutes.  Dr. Brand Males, M.D., [facility].C.P Pulmonary and Critical Care Medicine Staff Physician [facility] Pager: [phone], If no answer or between  15:00h - 7:00h: call [phone]  02/20/2012 8:43 AM

## 2012-02-20 NOTE — Progress Notes (Signed)
[facility] Progress Note Patient Name: [name] DOB: 1950/10/07 MRN: [id number]  Date of Service  02/20/2012   HPI/Events of Note   Boluses with CVP d/ced due to edema and stable BP.  CVP now ordered q shift.  eICU Interventions        YACOUB,WESAM 02/20/2012, 5:36 PM

## 2012-02-21 ENCOUNTER — Encounter (HOSPITAL_COMMUNITY): Payer: Self-pay | Admitting: General Surgery

## 2012-02-21 ENCOUNTER — Inpatient Hospital Stay (HOSPITAL_COMMUNITY): Payer: Medicaid Other

## 2012-02-21 LAB — GLUCOSE, CAPILLARY
Glucose-Capillary: 101 mg/dL — ABNORMAL HIGH (ref 70–99)
Glucose-Capillary: 106 mg/dL — ABNORMAL HIGH (ref 70–99)
Glucose-Capillary: 117 mg/dL — ABNORMAL HIGH (ref 70–99)
Glucose-Capillary: 123 mg/dL — ABNORMAL HIGH (ref 70–99)
Glucose-Capillary: 136 mg/dL — ABNORMAL HIGH (ref 70–99)
Glucose-Capillary: 97 mg/dL (ref 70–99)

## 2012-02-21 LAB — CBC
HCT: 29 % — ABNORMAL LOW (ref 39.0–52.0)
Hemoglobin: 10.2 g/dL — ABNORMAL LOW (ref 13.0–17.0)
MCH: 25 pg — ABNORMAL LOW (ref 26.0–34.0)
MCHC: 35.2 g/dL (ref 30.0–36.0)
MCV: 71.1 fL — ABNORMAL LOW (ref 78.0–100.0)
Platelets: 270 10*3/uL (ref 150–400)
RBC: 4.08 MIL/uL — ABNORMAL LOW (ref 4.22–5.81)
RDW: 19.9 % — ABNORMAL HIGH (ref 11.5–15.5)
WBC: 13.8 10*3/uL — ABNORMAL HIGH (ref 4.0–10.5)

## 2012-02-21 LAB — BASIC METABOLIC PANEL
BUN: 25 mg/dL — ABNORMAL HIGH (ref 6–23)
CO2: 21 mEq/L (ref 19–32)
Calcium: 7.2 mg/dL — ABNORMAL LOW (ref 8.4–10.5)
Chloride: 110 mEq/L (ref 96–112)
Creatinine, Ser: 1.28 mg/dL (ref 0.50–1.35)
GFR calc Af Amer: 69 mL/min — ABNORMAL LOW (ref >90)
GFR calc non Af Amer: 59 mL/min — ABNORMAL LOW (ref >90)
Glucose, Bld: 101 mg/dL — ABNORMAL HIGH (ref 70–99)
Potassium: 4.9 mEq/L (ref 3.5–5.1)
Sodium: 139 mEq/L (ref 135–145)

## 2012-02-21 LAB — PRO B NATRIURETIC PEPTIDE: Pro B Natriuretic peptide (BNP): 295.9 pg/mL — ABNORMAL HIGH (ref 0–125)

## 2012-02-21 LAB — AMMONIA: Ammonia: 24 umol/L (ref 11–60)

## 2012-02-21 LAB — PHOSPHORUS: Phosphorus: 3.1 mg/dL (ref 2.3–4.6)

## 2012-02-21 LAB — MAGNESIUM: Magnesium: 2.1 mg/dL (ref 1.5–2.5)

## 2012-02-21 IMAGING — CR DG CHEST 1V PORT
1 series · 1 of 1 positions shown · non-contrast
Comparison: Portable chest x-rays yesterday and dating back to
[DATE].

CLINICAL DATA: Ventilator dependent respiratory failure.  Recent
partial colectomy.  Follow up basilar atelectasis.

PORTABLE CHEST - 1 VIEW

[AP]
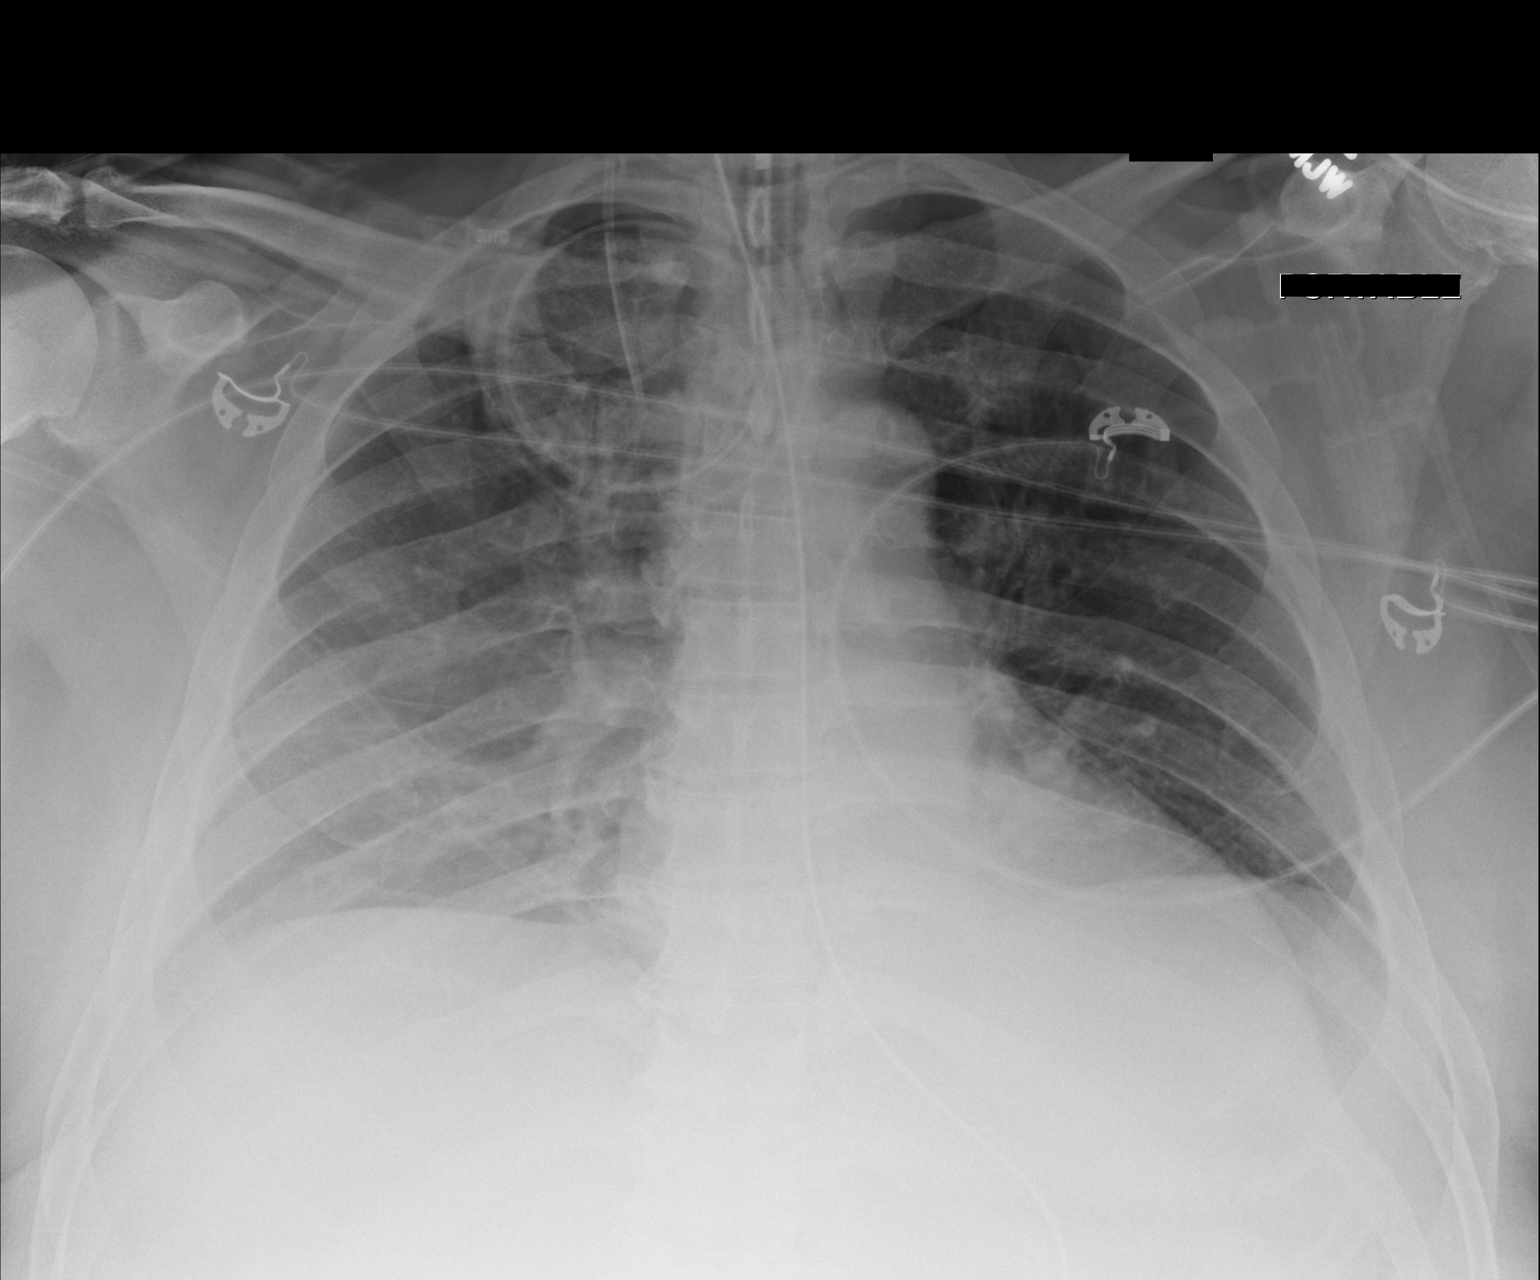

[1 of 1 positions shown; findings below may reference images not displayed]

FINDINGS: Endotracheal tube tip in satisfactory position projecting
approximately 5 cm above the carina.  Right jugular central venous
catheter tip projects over the upper SVC.  Nasogastric tube courses
below the diaphragm into the stomach.  Suboptimal inspiration with
worsening atelectasis in the lung bases.  Lungs otherwise clear.
Cardiac silhouette enlarged.  Pulmonary vascularity normal.
IMPRESSION: Support apparatus satisfactory.  Worsening bibasilar atelectasis.
Stable cardiomegaly without pulmonary edema.

## 2012-02-21 MED ORDER — FLUCONAZOLE IN SODIUM CHLORIDE 400-0.9 MG/200ML-% IV SOLN
400.0000 mg | INTRAVENOUS | Status: DC
  Administered 2012-02-21 – 2012-03-01 (×10): 400 mg via INTRAVENOUS
  Filled 2012-02-21 (×10): qty 200

## 2012-02-21 MED ORDER — VANCOMYCIN HCL IN DEXTROSE 1-5 GM/200ML-% IV SOLN
1000.0000 mg | Freq: Two times a day (BID) | INTRAVENOUS | Status: DC
  Administered 2012-02-21 – 2012-02-25 (×8): 1000 mg via INTRAVENOUS
  Filled 2012-02-21 (×8): qty 200

## 2012-02-21 MED ORDER — FUROSEMIDE 10 MG/ML IJ SOLN
20.0000 mg | Freq: Once | INTRAMUSCULAR | Status: AC
  Administered 2012-02-21: 20 mg via INTRAVENOUS

## 2012-02-21 NOTE — Progress Notes (Signed)
Pt's QTc prior to haldol administration 0.37.

## 2012-02-21 NOTE — Progress Notes (Signed)
1 Day Post-Op  Subjective: Sedated on vent.    Objective: Vital signs in last 24 hours: Temp:  [98.6 F (37 C)-102 F (38.9 C)] 100.4 F (38 C) (09/20 0732) Pulse Rate:  [25-78] 64  (09/20 0730) Resp:  [17-24] 18  (09/20 0730) BP: (93-134)/(52-75) 115/68 mmHg (09/20 0730) SpO2:  [95 %-100 %] 100 % (09/20 0730) FiO2 (%):  [38.1 %-40.4 %] 40 % (09/20 0730) Weight:  [238 lb 15.7 oz (108.4 kg)] 238 lb 15.7 oz (108.4 kg) (09/20 0500) Last BM Date: 02/19/12  Intake/Output from previous day: 09/19 0701 - 09/20 0700 In: 2643.5 [I.V.:2393.5; NG/GT:90; IV Piggyback:160] Out: 2990 [Urine:1620; Emesis/NG output:300; Drains:1060; Blood:10] Intake/Output this shift: Total I/O In: -  Out: 100 [Urine:100]  PE: Abd-slightly firm, VAC on, ileostomy viable with and small amount of output  Lab Results:   Basename 02/21/12 0400 02/20/12 0445  WBC 13.8* 13.0*  HGB 10.2* 9.8*  HCT 29.0* 27.6*  PLT 270 322   BMET  Basename 02/21/12 0400 02/20/12 0445  NA 139 143  K 4.9 4.6  CL 110 113*  CO2 21 23  GLUCOSE 101* 97  BUN 25* 30*  CREATININE 1.28 1.33  CALCIUM 7.2* 7.2*   PT/INR No results found for this basename: LABPROT:2,INR:2 in the last 72 hours Comprehensive Metabolic Panel:    Component Value Date/Time   NA 139 02/21/2012 0400   K 4.9 02/21/2012 0400   CL 110 02/21/2012 0400   CO2 21 02/21/2012 0400   BUN 25* 02/21/2012 0400   CREATININE 1.28 02/21/2012 0400   CREATININE 1.12 07/04/2011 1516   GLUCOSE 101* 02/21/2012 0400   CALCIUM 7.2* 02/21/2012 0400   AST 32 02/16/2012 1258   ALT 25 02/16/2012 1258   ALKPHOS 66 02/16/2012 1258   BILITOT 1.1 02/16/2012 1258   PROT 6.8 02/16/2012 1258   ALBUMIN 2.6* 02/16/2012 1258     Studies/Results: Dg Chest Port 1 View  02/21/2012  *RADIOLOGY REPORT*  Clinical Data: Ventilator dependent respiratory failure.  Recent partial colectomy.  Follow up basilar atelectasis.  PORTABLE CHEST - 1 VIEW  Comparison: Portable chest x-rays yesterday and  dating back to 02/17/2012.  Findings: Endotracheal tube tip in satisfactory position projecting approximately 5 cm above the carina.  Right jugular central venous catheter tip projects over the upper SVC.  Nasogastric tube courses below the diaphragm into the stomach.  Suboptimal inspiration with worsening atelectasis in the lung bases.  Lungs otherwise clear. Cardiac silhouette enlarged.  Pulmonary vascularity normal.  IMPRESSION: Support apparatus satisfactory.  Worsening bibasilar atelectasis. Stable cardiomegaly without pulmonary edema.   Original Report Authenticated By: Deniece Portela, M.D.    Dg Chest Port 1 View  02/20/2012  *RADIOLOGY REPORT*  Clinical Data: Status post intubation.  PORTABLE CHEST - 1 VIEW  Comparison: One view chest 02/20/2012 at 02:06 a.m.  Findings: Endotracheal tube terminates 2.2 cm above the carina and could be pulled back 1-2 cm for more optimal positioning.  The right IJ line and NG tube are stable.  Aeration is slightly improved.  Lung volumes remain low with mild bibasilar atelectasis.  IMPRESSION:  1.  The endotracheal tube terminates 2.2 cm above the carina and could be pulled back 1-2 cm for more optimal positioning. 2.  Support apparatus is otherwise stable. 3.  Improved aeration with persistent low lung volumes and right greater than left basilar atelectasis.   Original Report Authenticated By: Resa Miner. MATTERN, M.D.    Dg Chest Port 1 View  02/20/2012  *  RADIOLOGY REPORT*  Clinical Data: Intubated patient.  PORTABLE CHEST - 1 VIEW  Comparison: Chest 02/19/2012.  Findings: Endotracheal tube is in place with tip in good position approximately 3 cm above the carina.  Right IJ catheter and NG tube again noted.  There is a new right pleural effusion and basilar airspace disease.  Left lung remains clear.  No pneumothorax. Cardiomegaly is noted.  IMPRESSION:  1.  ET tube in good position. 2.  New right pleural effusion with basilar opacity which could be due to  atelectasis or pneumonia.   Original Report Authenticated By: Arvid Right. Luther Parody, M.D.     Anti-infectives: Anti-infectives     Start     Dose/Rate Route Frequency Ordered Stop   02/20/12 1000   vancomycin (VANCOCIN) 1,500 mg in sodium chloride 0.9 % 500 mL IVPB        1,500 mg 250 mL/hr over 120 Minutes Intravenous Every 24 hours 02/19/12 0942     02/19/12 1000  piperacillin-tazobactam (ZOSYN) IVPB 3.375 g       3.375 g 12.5 mL/hr over 240 Minutes Intravenous 3 times per day 02/19/12 0942     02/19/12 1000   vancomycin (VANCOCIN) 2,000 mg in sodium chloride 0.9 % 500 mL IVPB        2,000 mg 250 mL/hr over 120 Minutes Intravenous  Once 02/19/12 0942 02/19/12 1335   02/16/12 1615   ertapenem (INVANZ) 1 g in sodium chloride 0.9 % 50 mL IVPB        1 g 100 mL/hr over 30 Minutes Intravenous To Surgery 02/16/12 1606 02/16/12 1733   02/16/12 1600   ertapenem (INVANZ) 1 g in sodium chloride 0.9 % 50 mL IVPB  Status:  Discontinued        1 g 100 mL/hr over 30 Minutes Intravenous Every 24 hours 02/16/12 1508 02/19/12 0909   02/16/12 1415   cefTRIAXone (ROCEPHIN) 1 g in dextrose 5 % 50 mL IVPB        1 g 100 mL/hr over 30 Minutes Intravenous  Once 02/16/12 1407 02/16/12 1448          Assessment Principal Problem:  1. s/p expl lap with resection of ileocolonic anastomosis for leak 9/15-too much small bowel edema for fascial closure; diuresis in progress   2. VDRF-per CCM   3. ARF-resolved.   LOS: 5 days   Plan: Back to OR Sunday for VAC change and possible wound closure.   Arlenne Kimbley Lenna Sciara 02/21/2012

## 2012-02-21 NOTE — Progress Notes (Signed)
Name: [name] MRN: [id number] DOB: June 01, 1951    LOS: 5 days Date of admit: 02/16/2012 PCP is [name], MD   Referring Provider:  [name] of [facility] Reason for Referral:  Post op resp failure, metabolic acidosis and hypotension PCP is [name], MD  Hx obtained from chart extraction in epic. Patient unable to provide hx  PULMONARY / CRITICAL CARE MEDICINE  HPI:  Patient is a 60 year old gentleman smoker, OSA on cpap, Obese, who underwent colonoscopy by [name] and was found to have a 3-4 cm sessile mass the distal transverse colon. 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 On 02/06/12 underwent laparoscopic-assisted mobilization splenic flexure, right hemicolectomy with primary anastomosis.  Postop had acute renal failure with postoperative hypovolemia, which resolved. He also had a postop ileus. He was discharged on 02/13/12. At home, on 02/14/12 he had worsening difficulty getting out of bed. He was unable to eat more than a few bites. Saturday 02/15/12, he had increasing abdominal pain ongoing nausea some vomiting again he was unable to eat.  Then on 02/16/12 presented with  large amount of pneumoperitoneum felt to be extremely large even having had surgery several days. There was a normally dilated loops of small bowel with scattered air-fluid levels bowel loops measuring up to 5 cm in diameter. Concerning for obstruction and possible perforation.   Labs show normal white count creatinine was up to 2.26 and  CO2 is 15. EKG shows some inferior changes, but nonspecific.  He then on  02/16/12 underwent emergent  exploratory laparotomy and found to have anastomitic leak (not perforated viscus) and s/p resection ileocolonic anastamosis and ileostomy and application of wound vac. Operative course complicated by hypotension and blood loss needing 2U PRBC . Postoperative remained vent dependent and abg showed metabolic acidosis with somewhat poor resp  compensation. BP soft wit MAP 64. PCCM consulted 02/16/2012 for med mgmt   DEVICES ETT 02/16/2012 > 02/20/12 (self extubated post OR), 02/20/12 >> Rt IJ 02/16/2012 A line 02/16/2012 >>02/19/12   Events Since Admission: 02/16/2012 > P lap. Admit. VDRF. ATN. Lactic acidosis. Periotinitis. P lap due to anast leak 02/17/12: - 40% fio2/peep 5. Swedation wean results in c/o pain and related agitatiion. Making urine but at 20cc/h. Pressor dependent  02/18/12: Pain still an issue with resultant agitation - needing fent gtt and lot of versed boluses. Lot of fluid boluses fro CVP >10 but < 12. Levophed need worse to 25mcg. FEbrile  9/17 -  unable to close fascia because of bowel edema and significant increase in peak airway pressure  9/18: Improving pressor need. Resolved renal failure, Severe agitation in pm - Rx haldol + precedex 9/19: Or trip:  no evidence of abscess. Centrally, the small bowel was very fairly adherent to itself and still. Fascia could not be approximated. self extubate due to agitation   SUBJECTIVE/OVERNIGHT/INTERVAL HX  Events of 02/20/12 noted. Off pressors x 24h. . Still febrile despite broad antibiotics, Significant agitation overnight. WUA in progress   Current Status: Critical  Vital Signs: Temp:  [98.6 F (37 C)-102 F (38.9 C)] 100.4 F (38 C) (09/20 0732) Pulse Rate:  [25-78] 64  (09/20 0730) Resp:  [17-24] 18  (09/20 0730) BP: (93-134)/(52-75) 115/68 mmHg (09/20 0730) SpO2:  [95 %-100 %] 100 % (09/20 0730) FiO2 (%):  [38.1 %-40.4 %] 40 % (09/20 0730) Weight:  [108.4 kg (238 lb 15.7 oz)] 108.4 kg (238 lb 15.7 oz) (09/20 0500)  Physical Examination: General:  Obese  male. On vent. Looks critically ill Neuro:  RASS -3.  Not tracking. PEriodically agitated per hx. WUA in pprogress HEENT:  ETT tube + Neck:  Soft, R IJ + Cardiovascular:  HR 104. MAP 70, S1S2+ Lungs:  PRVC. No vent dysnchrony Abdomen:  Obese, BOWEL SOUNDS + SLUGGISH. Wound VAC + Musculoskeletal:  No  cyanosis. No clubbing. No edema Skin:  Intact  Dg Chest Port 1 View  02/21/2012  *RADIOLOGY REPORT*  Clinical Data: Ventilator dependent respiratory failure.  Recent partial colectomy.  Follow up basilar atelectasis.  PORTABLE CHEST - 1 VIEW  Comparison: Portable chest x-rays yesterday and dating back to 02/17/2012.  Findings: Endotracheal tube tip in satisfactory position projecting approximately 5 cm above the carina.  Right jugular central venous catheter tip projects over the upper SVC.  Nasogastric tube courses below the diaphragm into the stomach.  Suboptimal inspiration with worsening atelectasis in the lung bases.  Lungs otherwise clear. Cardiac silhouette enlarged.  Pulmonary vascularity normal.  IMPRESSION: Support apparatus satisfactory.  Worsening bibasilar atelectasis. Stable cardiomegaly without pulmonary edema.   Original Report Authenticated By: Deniece Portela, M.D.    Dg Chest Port 1 View  02/20/2012  *RADIOLOGY REPORT*  Clinical Data: Status post intubation.  PORTABLE CHEST - 1 VIEW  Comparison: One view chest 02/20/2012 at 02:06 a.m.  Findings: Endotracheal tube terminates 2.2 cm above the carina and could be pulled back 1-2 cm for more optimal positioning.  The right IJ line and NG tube are stable.  Aeration is slightly improved.  Lung volumes remain low with mild bibasilar atelectasis.  IMPRESSION:  1.  The endotracheal tube terminates 2.2 cm above the carina and could be pulled back 1-2 cm for more optimal positioning. 2.  Support apparatus is otherwise stable. 3.  Improved aeration with persistent low lung volumes and right greater than left basilar atelectasis.   Original Report Authenticated By: Resa Miner. MATTERN, M.D.    Dg Chest Port 1 View  02/20/2012  *RADIOLOGY REPORT*  Clinical Data: Intubated patient.  PORTABLE CHEST - 1 VIEW  Comparison: Chest 02/19/2012.  Findings: Endotracheal tube is in place with tip in good position approximately 3 cm above the carina.  Right  IJ catheter and NG tube again noted.  There is a new right pleural effusion and basilar airspace disease.  Left lung remains clear.  No pneumothorax. Cardiomegaly is noted.  IMPRESSION:  1.  ET tube in good position. 2.  New right pleural effusion with basilar opacity which could be due to atelectasis or pneumonia.   Original Report Authenticated By: Arvid Right. Luther Parody, M.D.      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 ABG has poor resp compensation to metab acidosis in immediate post op period  - on 02/21/2012: Does not meet SBT criteria due to  agitated delirium, planned trip to OR next few days though metabolic acidosis resolved and shock resolved  P:   Full vent support   Serial cxr and abg  CARDIOVASCULAR  Lab 02/19/12 0930 02/17/12 0420 02/16/12 1935 02/16/12 1504 02/16/12 1450  TROPONINI -- -- -- <0.30 --  LATICACIDVEN 1.3 1.3 2.0 -- 3.4*  PROBNP -- -- -- -- --    A: Hypotension with high lactate and high PCT and clinical setting = Suggestive of occult septic shock  - on 02/17/12: In septic shock with CVP 11 and on  levophed 17mcg but lactate has cleared. SCvo2 70s  -- on 02/20/12: off levophed   - 02/21/12: remains off pressors  P:  Empiric lasix x 1 02/21/12 Check bnp 02/22/12 and consider  More diuresis CVP monitoring - fluids if < 10 (threshold lowered from 12 to 10) Pressors for MAP > 65 No steropids in view of plap  RENAL  Lab 02/21/12 0400 02/20/12 0445 02/19/12 0406 02/18/12 0405 02/17/12 1413 02/17/12 0420  NA 139 143 141 140 139 --  K 4.9 4.6 -- -- -- --  CL 110 113* 113* 115* 113* --  CO2 21 23 18* 16* 16* --  BUN 25* 30* 38* 46* 42* --  CREATININE 1.28 1.33 1.60* 2.24* 2.28* --  CALCIUM 7.2* 7.2* 6.9* 6.5* 6.6* --  MG 2.1 2.3 2.1 1.6 -- 1.4*  PHOS 3.1 2.6 3.3 4.2 -- 5.4*   Intake/Output      09/19 0701 - 09/20 0700 09/20 0701 - 09/21 0700   I.V. (mL/kg) 2393.5 (22.1)    NG/GT 90    IV Piggyback 160    Total Intake(mL/kg) 2643.5 (24.4)    Urine (mL/kg/hr) 1620 (0.6) 100   Emesis/NG output 300    Drains 1060    Stool     Blood 10    Total Output 2990 100   Net -346.5 -100         Foley: 02/16/2012   A:  Acute renal fialure likely due to peritionitis. Sepsis  and volume depltion at admit 02/16/2012  - on 02/21/2012: Resolved renal failure and normalized bicarb off bicarb gtt  P K goal > 4, Mag goal > 2, Phos goal > 2.1 Monitor BMET and lytes cvp goal > 10 and MAP goal > 65   GASTROINTESTINAL  Lab 02/16/12 1258  AST 32  ALT 25  ALKPHOS 66  BILITOT 1.1  PROT 6.8  ALBUMIN 2.6*    A:  Acute anastomotic leak pertionitis at admit 02/16/2012  - on 02/17/12: Lot of pain. Some bowel sounds +  - on 02/18/12:  Unable to close due to edema and high PIP  - on 02/20/12:  OR trip unable to close due to edema  - on 02/21/12: [facility] considering more diuresis to help reduce edema  P:   Per [facility]: plans for OR 02/23/12 after [facility] tries to diurese aggressively to help edema  HEMATOLOGIC  Lab 02/21/12 0400 02/20/12 0445 02/18/12 0405 02/17/12 0420 02/16/12 1935 02/16/12 1504  HGB 10.2*  9.8* 9.6* 9.9* 10.4* --  HCT 29.0* 27.6* 26.7* 27.9* 29.3* --  PLT 270 322 367 349 355 --  INR -- -- -- -- -- 1.32  APTT -- -- -- -- -- 33  A:  S/p 2u prbc in OR for blood loss on 02/16/2012 - on 02/21/2012: HGb profile and suggestive of anemia of critical illness  P:  - PRBC for hgb </= 6.9gm%     - exceptions are   -  if ACS susepcted/confirmed then transfuse for hgb </= 8.0gm%,  or    -  If septic shock first 24h and scvo2 < 70% then transfuse for hgb </= 9.0gm%   - active bleeding with hemodynamic instability, then transfuse regardless of hemoglobin value   At at all times try to transfuse 1 unit prbc as possible with exception of active hemorrhage    INFECTIOUS  Lab 02/21/12 0400 02/20/12 0445 02/19/12 0930 02/19/12 0406 02/18/12 0405 02/17/12 0420 02/16/12 1935  WBC 13.8* 13.0* -- -- 15.6* 16.2* 12.5*  PROCALCITON -- -- 8.20 9.73 -- -- 28.65   Cultures: Results for orders placed during the hospital encounter of 02/16/12  MRSA PCR SCREENING     Status: Normal   Collection Time   02/16/12  6:43 PM      Component Value Range Status Comment   MRSA by PCR NEGATIVE  NEGATIVE Final   URINE CULTURE     Status: Normal   Collection Time   02/16/12  8:10 PM      Component Value Range Status Comment   Specimen Description URINE, CATHETERIZED   Final    Special Requests NONE   Final    Culture  Setup Time 02/16/2012 20:46   Final    Colony Count NO GROWTH   Final    Culture NO GROWTH   Final    Report Status 02/18/2012 FINAL   Final   CULTURE, BLOOD (ROUTINE X 2)     Status: Normal (Preliminary result)   Collection Time   02/19/12 11:20 AM      Component Value Range Status Comment   Specimen Description BLOOD RIGHT ARM   Final    Special Requests BOTTLES DRAWN AEROBIC AND ANAEROBIC 10CC   Final    Culture  Setup Time 02/19/2012 15:21   Final    Culture     Final    Value:        BLOOD CULTURE RECEIVED NO GROWTH TO DATE CULTURE WILL BE HELD FOR 5 DAYS BEFORE ISSUING A FINAL  NEGATIVE REPORT   Report Status PENDING   Incomplete   URINE CULTURE     Status: Normal   Collection Time   02/19/12 11:22 AM      Component Value Range Status Comment   Specimen Description URINE, CATHETERIZED   Final    Special Requests Normal   Final    Culture  Setup Time 02/19/2012 11:45   Final    Colony Count NO GROWTH   Final    Culture NO GROWTH   Final    Report Status 02/20/2012 FINAL   Final   CULTURE, BLOOD (ROUTINE X 2)     Status: Normal (Preliminary result)   Collection Time   02/19/12 11:25 AM      Component Value Range Status Comment   Specimen Description BLOOD RIGHT HAND   Final    Special Requests BOTTLES DRAWN AEROBIC AND ANAEROBIC 10CC   Final    Culture  Setup Time 02/19/2012 15:21   Final    Culture     Final    Value:        BLOOD CULTURE RECEIVED NO GROWTH TO DATE CULTURE WILL BE HELD FOR 5 DAYS BEFORE ISSUING  A FINAL NEGATIVE REPORT   Report Status PENDING   Incomplete   CULTURE, RESPIRATORY     Status: Normal (Preliminary result)   Collection Time   02/19/12  6:19 PM      Component Value Range Status Comment   Specimen Description ENDOTRACHEAL   Final    Special Requests NONE   Final    Gram Stain     Final    Value: MODERATE WBC PRESENT,BOTH PMN AND MONONUCLEAR     RARE SQUAMOUS EPITHELIAL CELLS PRESENT     RARE YEAST   Culture PENDING   Incomplete    Report Status PENDING   Incomplete     Antibiotics: Anti-infectives     Start     Dose/Rate Route Frequency Ordered Stop   02/20/12 1000   vancomycin (VANCOCIN) 1,500 mg in sodium chloride 0.9 % 500 mL IVPB        1,500 mg 250 mL/hr over 120 Minutes Intravenous Every 24 hours 02/19/12 0942     02/19/12 1000  piperacillin-tazobactam (ZOSYN) IVPB 3.375 g       3.375 g 12.5 mL/hr over 240 Minutes Intravenous 3 times per day 02/19/12 0942     02/19/12 1000   vancomycin (VANCOCIN) 2,000 mg in sodium chloride 0.9 % 500 mL IVPB        2,000 mg 250 mL/hr over 120 Minutes Intravenous  Once 02/19/12  0942 02/19/12 1335   02/16/12 1615   ertapenem (INVANZ) 1 g in sodium chloride 0.9 % 50 mL IVPB        1 g 100 mL/hr over 30 Minutes Intravenous To Surgery 02/16/12 1606 02/16/12 1733   02/16/12 1600   ertapenem (INVANZ) 1 g in sodium chloride 0.9 % 50 mL IVPB  Status:  Discontinued        1 g 100 mL/hr over 30 Minutes Intravenous Every 24 hours 02/16/12 1508 02/19/12 0909   02/16/12 1415   cefTRIAXone (ROCEPHIN) 1 g in dextrose 5 % 50 mL IVPB        1 g 100 mL/hr over 30 Minutes Intravenous  Once 02/16/12 1407 02/16/12 1448           A:  Peritonitis. SEvere sepsis/septic shock - on 02/19/12: still febrile again. Increased resp secretions. Elevated but improving PCT. Pan cultured and abx changed - 02/21/12: still febrile despite abx change on 9/18 P:   Add diflucan 02/21/12 Recheck PCT 02/21/12 AWait 02/19/12 Pan culture Vanc and Zosyn since 02/19/12  ENDOCRINE  Lab 02/21/12 0736 02/21/12 0357 02/20/12 2334 02/20/12 2016 02/20/12 1628  GLUCAP 97 101* 132* 97 125*   A:  Known DM   P:   ICU hyperglycemia protocol  NEUROLOGIC  A: Pain main issue on POD#1-3 after admit 02/16/2012 On 02/21/2012. with Agitated delirum that is severe. On precedex and scheduled haldol since 02/19/12  P:   Daily WUA Pain control with fent gtt Haldol scheduled since 02/19/12 Precedex since 02/19/12 versed prn dosage Use fent and versed prn (RN asked to use more fent insetead of versed)  BEST PRACTICE / DISPOSITION Level of Care:  ICU Primary Service:  [facility] (D/w Dr Zella Richer on 02/21/12: Plans for diuresis 9/20 and 02/22/12 and them aim for OR on Sunday 02/23/12) Consultants:  PCCM Code Status:  Full Diet:  NPO DVT Px:  SCD GI Px:  Protonix Skin Integrity:  Intact Social / Family:  None at bedside but daughter was updated by PCCM on medical issues 02/19/12 at bedside  The patient is critically ill with multiple organ systems failure and requires high complexity decision making for assessment  and support, frequent evaluation and titration of therapies, application of advanced monitoring technologies and extensive interpretation of multiple databases.   Critical Care Time devoted to patient care services described in this note is  35  Minutes.  Dr. Brand Males, M.D., [facility].C.P Pulmonary and Critical Care Medicine Staff Physician [facility] Pager: [phone], If no answer or between  15:00h - 7:00h: call [phone]  02/21/2012 8:25 AM

## 2012-02-21 NOTE — Progress Notes (Signed)
MD Ramaswamy paged to make aware of STAT ammonia and BNP levels per order. No new orders.   MD also made aware that pt had become agitated with the fentanyl being turned off- VO to turn fentanyl back on received.  Lorre Munroe

## 2012-02-21 NOTE — Progress Notes (Signed)
QTc interval measured before haldol admin (QTc=0.39)  [name] R

## 2012-02-21 NOTE — Progress Notes (Signed)
QTc interval measured before haldol administration. Noted QTc to be 0.40  Wayland Denis RN

## 2012-02-21 NOTE — Progress Notes (Signed)
ANTIBIOTIC CONSULT NOTE - FOLLOW UP  Pharmacy Consult for Vancomycin, Zosyn, Fluconazole Indication: suspected HCAP, peritonitis/abdominal sepsis  No Known Allergies  Patient Measurements: Height: 5\' 11"  (180.3 cm) Weight: 238 lb 15.7 oz (108.4 kg) IBW/kg (Calculated) : 75.3  Adjusted Body Weight:   Vital Signs: Temp: 100.4 F (38 C) (09/20 0732) Temp src: Oral (09/20 0732) BP: 121/78 mmHg (09/20 0800) Pulse Rate: 64  (09/20 0800) Intake/Output from previous day: 09/19 0701 - 09/20 0700 In: 2643.5 [I.V.:2393.5; NG/GT:90; IV Piggyback:160] Out: 2990 [Urine:1620; Emesis/NG output:300; Drains:1060; Blood:10] Intake/Output from this shift: Total I/O In: 105 [I.V.:92.5; IV Piggyback:12.5] Out: 250 [Urine:100; Emesis/NG output:100; Drains:50]  Labs:  [facility] 02/21/12 0400 02/20/12 0445 02/19/12 0406  WBC 13.8* 13.0* --  HGB 10.2* 9.8* --  PLT 270 322 --  LABCREA -- -- --  CREATININE 1.28 1.33 1.60*   Estimated Creatinine Clearance: 76.8 ml/min (by C-G formula based on Cr of 1.28). No results found for this basename: VANCOTROUGH:2,VANCOPEAK:2,VANCORANDOM:2,GENTTROUGH:2,GENTPEAK:2,GENTRANDOM:2,TOBRATROUGH:2,TOBRAPEAK:2,TOBRARND:2,AMIKACINPEAK:2,AMIKACINTROU:2,AMIKACIN:2, in the last 72 hours   Microbiology: Recent Results (from the past 720 hour(s))  SURGICAL PCR SCREEN     Status: Normal   Collection Time   01/30/12 10:16 AM      Component Value Range Status Comment   MRSA, PCR NEGATIVE  NEGATIVE Final    Staphylococcus aureus NEGATIVE  NEGATIVE Final   MRSA PCR SCREENING     Status: Normal   Collection Time   02/16/12  6:43 PM      Component Value Range Status Comment   MRSA by PCR NEGATIVE  NEGATIVE Final   URINE CULTURE     Status: Normal   Collection Time   02/16/12  8:10 PM      Component Value Range Status Comment   Specimen Description URINE, CATHETERIZED   Final    Special Requests NONE   Final    Culture  Setup Time 02/16/2012 20:46   Final    Colony Count  NO GROWTH   Final    Culture NO GROWTH   Final    Report Status 02/18/2012 FINAL   Final   CULTURE, BLOOD (ROUTINE X 2)     Status: Normal (Preliminary result)   Collection Time   02/19/12 11:20 AM      Component Value Range Status Comment   Specimen Description BLOOD RIGHT ARM   Final    Special Requests BOTTLES DRAWN AEROBIC AND ANAEROBIC 10CC   Final    Culture  Setup Time 02/19/2012 15:21   Final    Culture     Final    Value:        BLOOD CULTURE RECEIVED NO GROWTH TO DATE CULTURE WILL BE HELD FOR 5 DAYS BEFORE ISSUING A FINAL NEGATIVE REPORT   Report Status PENDING   Incomplete   URINE CULTURE     Status: Normal   Collection Time   02/19/12 11:22 AM      Component Value Range Status Comment   Specimen Description URINE, CATHETERIZED   Final    Special Requests Normal   Final    Culture  Setup Time 02/19/2012 11:45   Final    Colony Count NO GROWTH   Final    Culture NO GROWTH   Final    Report Status 02/20/2012 FINAL   Final   CULTURE, BLOOD (ROUTINE X 2)     Status: Normal (Preliminary result)   Collection Time   02/19/12 11:25 AM      Component Value Range Status Comment  Specimen Description BLOOD RIGHT HAND   Final    Special Requests BOTTLES DRAWN AEROBIC AND ANAEROBIC 10CC   Final    Culture  Setup Time 02/19/2012 15:21   Final    Culture     Final    Value:        BLOOD CULTURE RECEIVED NO GROWTH TO DATE CULTURE WILL BE HELD FOR 5 DAYS BEFORE ISSUING A FINAL NEGATIVE REPORT   Report Status PENDING   Incomplete   CULTURE, RESPIRATORY     Status: Normal (Preliminary result)   Collection Time   02/19/12  6:19 PM      Component Value Range Status Comment   Specimen Description ENDOTRACHEAL   Final    Special Requests NONE   Final    Gram Stain     Final    Value: MODERATE WBC PRESENT,BOTH PMN AND MONONUCLEAR     RARE SQUAMOUS EPITHELIAL CELLS PRESENT     RARE YEAST   Culture PENDING   Incomplete    Report Status PENDING   Incomplete     Anti-infectives     Start      Dose/Rate Route Frequency Ordered Stop   02/20/12 1000   vancomycin (VANCOCIN) 1,500 mg in sodium chloride 0.9 % 500 mL IVPB        1,500 mg 250 mL/hr over 120 Minutes Intravenous Every 24 hours 02/19/12 0942     02/19/12 1000  piperacillin-tazobactam (ZOSYN) IVPB 3.375 g       3.375 g 12.5 mL/hr over 240 Minutes Intravenous 3 times per day 02/19/12 0942     02/19/12 1000   vancomycin (VANCOCIN) 2,000 mg in sodium chloride 0.9 % 500 mL IVPB        2,000 mg 250 mL/hr over 120 Minutes Intravenous  Once 02/19/12 0942 02/19/12 1335   02/16/12 1615   ertapenem (INVANZ) 1 g in sodium chloride 0.9 % 50 mL IVPB        1 g 100 mL/hr over 30 Minutes Intravenous To Surgery 02/16/12 1606 02/16/12 1733   02/16/12 1600   ertapenem (INVANZ) 1 g in sodium chloride 0.9 % 50 mL IVPB  Status:  Discontinued        1 g 100 mL/hr over 30 Minutes Intravenous Every 24 hours 02/16/12 1508 02/19/12 0909   02/16/12 1415   cefTRIAXone (ROCEPHIN) 1 g in dextrose 5 % 50 mL IVPB        1 g 100 mL/hr over 30 Minutes Intravenous  Once 02/16/12 1407 02/16/12 1448          Assessment: 60yom on Vancomycin and Zosyn Day 3 (6 days total antibiotics with Ertapenem 9/15-9/17) for suspected HCAP/peritonitis. Pt developed fevers 9/19 (Tmax 102) - antibiotics to be broadened with Fluconazole for possible abdominal sepsis (please note, with addition of fluconazole to scheduled Haldol, patient is at increased risk for QTc prolongation - monitor regularly). WBC have stabilized and no significant findings reported on cultures. Per MD notes, ARF has resolved and SCr continues to decrease (2.4-->1.28, CrCl 77) - will increase Vancomycin regimen.   Goal of Therapy:  Vancomycin trough level 15-20 mcg/ml  Plan:  1. Change Vancomycin to 1g IV q12h - next dose tonight 2. Fluconazole 400mg  IV q24h 3. Continue Zosyn 3.375g IV q8h - infuse over 4 hours 4. Plan to check Vancomycin trough over weekend if continued 5. Monitor renal  function, cultures and clincal progression  Earleen Newport [id number] 02/21/2012,9:12 AM

## 2012-02-22 ENCOUNTER — Inpatient Hospital Stay (HOSPITAL_COMMUNITY): Payer: Medicaid Other

## 2012-02-22 LAB — CBC
HCT: 28.8 % — ABNORMAL LOW (ref 39.0–52.0)
Hemoglobin: 9.9 g/dL — ABNORMAL LOW (ref 13.0–17.0)
MCH: 24.6 pg — ABNORMAL LOW (ref 26.0–34.0)
MCHC: 34.4 g/dL (ref 30.0–36.0)
MCV: 71.5 fL — ABNORMAL LOW (ref 78.0–100.0)
Platelets: 286 10*3/uL (ref 150–400)
RBC: 4.03 MIL/uL — ABNORMAL LOW (ref 4.22–5.81)
RDW: 19.9 % — ABNORMAL HIGH (ref 11.5–15.5)
WBC: 14.3 10*3/uL — ABNORMAL HIGH (ref 4.0–10.5)

## 2012-02-22 LAB — CULTURE, RESPIRATORY W GRAM STAIN

## 2012-02-22 LAB — CULTURE, RESPIRATORY

## 2012-02-22 LAB — GLUCOSE, CAPILLARY
Glucose-Capillary: 139 mg/dL — ABNORMAL HIGH (ref 70–99)
Glucose-Capillary: 120 mg/dL — ABNORMAL HIGH (ref 70–99)
Glucose-Capillary: 176 mg/dL — ABNORMAL HIGH (ref 70–99)
Glucose-Capillary: 125 mg/dL — ABNORMAL HIGH (ref 70–99)

## 2012-02-22 LAB — BASIC METABOLIC PANEL
BUN: 29 mg/dL — ABNORMAL HIGH (ref 6–23)
CO2: 21 mEq/L (ref 19–32)
Calcium: 7.4 mg/dL — ABNORMAL LOW (ref 8.4–10.5)
Chloride: 112 mEq/L (ref 96–112)
Creatinine, Ser: 1.35 mg/dL (ref 0.50–1.35)
GFR calc Af Amer: 64 mL/min — ABNORMAL LOW (ref >90)
GFR calc non Af Amer: 56 mL/min — ABNORMAL LOW (ref >90)
Glucose, Bld: 153 mg/dL — ABNORMAL HIGH (ref 70–99)
Potassium: 5.2 mEq/L — ABNORMAL HIGH (ref 3.5–5.1)
Sodium: 141 mEq/L (ref 135–145)

## 2012-02-22 LAB — PROCALCITONIN: Procalcitonin: 2.55 ng/mL

## 2012-02-22 LAB — PHOSPHORUS: Phosphorus: 3.5 mg/dL (ref 2.3–4.6)

## 2012-02-22 LAB — PREALBUMIN: Prealbumin: 3.8 mg/dL — ABNORMAL LOW (ref 17.0–34.0)

## 2012-02-22 IMAGING — CR DG CHEST 1V PORT
1 series · 1 of 1 positions shown · non-contrast
Comparison: [DATE]

CLINICAL DATA: Endotracheal tube positioning.  Ventilator dependent
respiratory failure.  Recent partial colectomy.

PORTABLE CHEST - 1 VIEW

[AP]
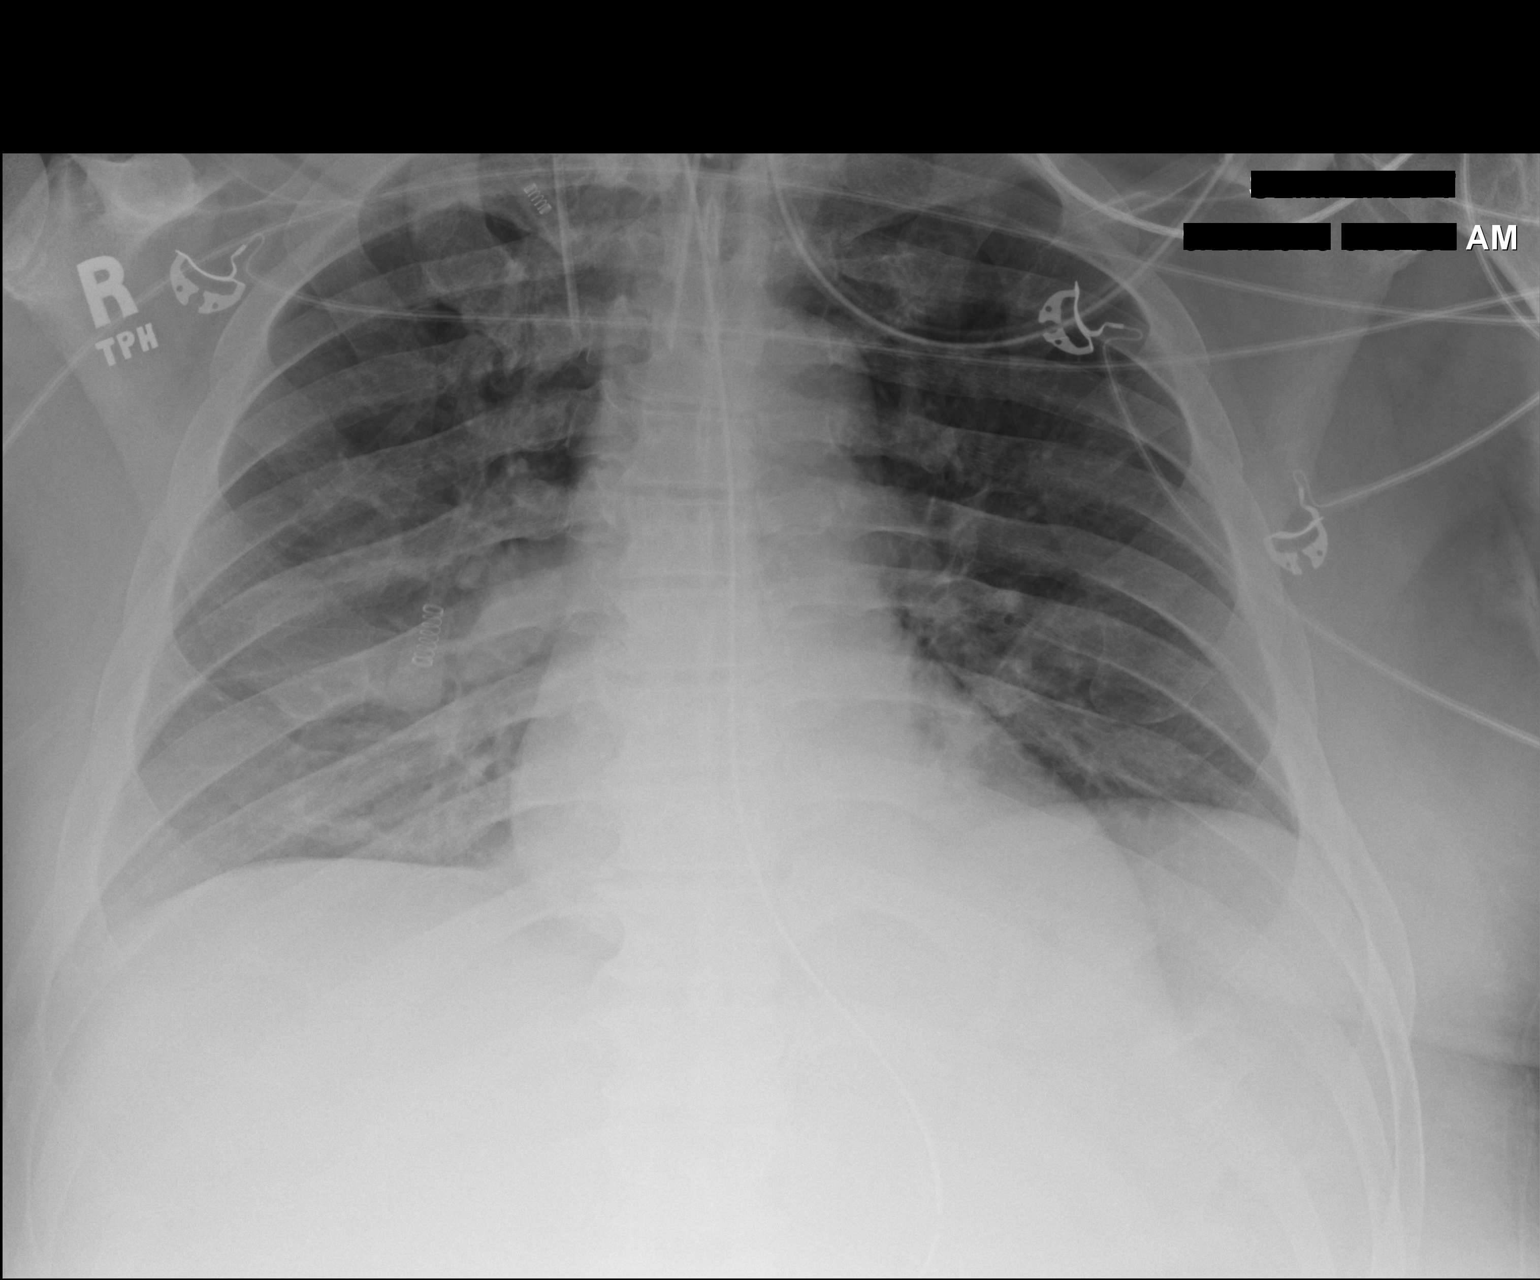

[1 of 1 positions shown; findings below may reference images not displayed]

FINDINGS: Endotracheal tube tip is 3.8 cm above the carina.
Nasogastric tube extends into the stomach.  The right internal
jugular line appears to be in the vicinity of the SVC confluence.

There is a suggestion of left ventricular enlargement based on
cardiac contour. Low lung volumes are present, causing crowding of
the pulmonary vasculature.  Vague density at the right lung base
does not silhouette the hemidiaphragm and could represent layering
effusion.  There is minimal airspace opacity medially at the left
lung base as well.
IMPRESSION: 1.  Endotracheal tube is satisfactorily positioned, tip 3.8 cm
above the carina.
2.  Right IJ line tip is at the confluence of the SVC.
3.  Mild retrocardiac airspace opacity in the left lower lobe,
potentially atelectasis or pneumonia.
4.  Hazy opacity over the right lung base, query small layering
pleural effusion.

## 2012-02-22 MED ORDER — INSULIN REGULAR HUMAN 100 UNIT/ML IJ SOLN
INTRAVENOUS | Status: AC
  Filled 2012-02-22: qty 2000

## 2012-02-22 MED ORDER — BIOTENE DRY MOUTH MT LIQD
15.0000 mL | Freq: Four times a day (QID) | OROMUCOSAL | Status: DC
  Administered 2012-02-22 – 2012-03-01 (×27): 15 mL via OROMUCOSAL

## 2012-02-22 NOTE — Progress Notes (Signed)
Patient ID: [name], male   DOB: 14-Feb-1951, 61 y.o.   MRN: [id number] 2 Days Post-Op  Subjective: Sedated on vent.  No nutrition started yet.  WBCs slightly up.  No acute events.    Objective: Vital signs in last 24 hours: Temp:  [98 F (36.7 C)-102.4 F (39.1 C)] 100.1 F (37.8 C) (09/21 0736) Pulse Rate:  [59-83] 83  (09/21 0900) Resp:  [15-20] 15  (09/21 0900) BP: (99-139)/(53-75) 99/53 mmHg (09/21 0900) SpO2:  [100 %] 100 % (09/21 0900) FiO2 (%):  [30 %-40.4 %] 30 % (09/21 0904) Weight:  [234 lb 8 oz (106.369 kg)] 234 lb 8 oz (106.369 kg) (09/21 0600) Last BM Date: 02/21/12 (ileostomy)  Intake/Output from previous day: 09/20 0701 - 09/21 0700 In: 3290 [I.V.:2120; NG/GT:120; IV Piggyback:1050] Out: 3370 [Urine:2020; Emesis/NG output:550; Drains:750; Stool:50] Intake/Output this shift: Total I/O In: 240 [I.V.:185; NG/GT:30; IV Piggyback:25] Out: 385 [Urine:335; Drains:50]  PE: Abd-reasonably soft, distended,  VAC on, ileostomy viable with and small amount of output.  No flatus in bag.    Lab Results:   Basename 02/22/12 0418 02/21/12 0400  WBC 14.3* 13.8*  HGB 9.9* 10.2*  HCT 28.8* 29.0*  PLT 286 270   BMET  Basename 02/22/12 0418 02/21/12 0400  NA 141 139  K 5.2* 4.9  CL 112 110  CO2 21 21  GLUCOSE 153* 101*  BUN 29* 25*  CREATININE 1.35 1.28  CALCIUM 7.4* 7.2*   PT/INR No results found for this basename: LABPROT:2,INR:2 in the last 72 hours Comprehensive Metabolic Panel:    Component Value Date/Time   NA 141 02/22/2012 0418   K 5.2* 02/22/2012 0418   CL 112 02/22/2012 0418   CO2 21 02/22/2012 0418   BUN 29* 02/22/2012 0418   CREATININE 1.35 02/22/2012 0418   CREATININE 1.12 07/04/2011 1516   GLUCOSE 153* 02/22/2012 0418   CALCIUM 7.4* 02/22/2012 0418   AST 32 02/16/2012 1258   ALT 25 02/16/2012 1258   ALKPHOS 66 02/16/2012 1258   BILITOT 1.1 02/16/2012 1258   PROT 6.8 02/16/2012 1258   ALBUMIN 2.6* 02/16/2012 1258     Studies/Results: Dg Chest  Port 1 View  02/22/2012  *RADIOLOGY REPORT*  Clinical Data: Endotracheal tube positioning.  Ventilator dependent respiratory failure.  Recent partial colectomy.  PORTABLE CHEST - 1 VIEW  Comparison: 02/21/2012  Findings: Endotracheal tube tip is 3.8 cm above the carina. Nasogastric tube extends into the stomach.  The right internal jugular line appears to be in the vicinity of the SVC confluence.  There is a suggestion of left ventricular enlargement based on cardiac contour. Low lung volumes are present, causing crowding of the pulmonary vasculature.  Vague density at the right lung base does not silhouette the hemidiaphragm and could represent layering effusion.  There is minimal airspace opacity medially at the left lung base as well.  IMPRESSION:  1.  Endotracheal tube is satisfactorily positioned, tip 3.8 cm above the carina. 2.  Right IJ line tip is at the confluence of the SVC. 3.  Mild retrocardiac airspace opacity in the left lower lobe, potentially atelectasis or pneumonia. 4.  Hazy opacity over the right lung base, query small layering pleural effusion.   Original Report Authenticated By: Carron Curie, M.D.    Dg Chest Port 1 View  02/21/2012  *RADIOLOGY REPORT*  Clinical Data: Ventilator dependent respiratory failure.  Recent partial colectomy.  Follow up basilar atelectasis.  PORTABLE CHEST - 1 VIEW  Comparison: Portable chest  x-rays yesterday and dating back to 02/17/2012.  Findings: Endotracheal tube tip in satisfactory position projecting approximately 5 cm above the carina.  Right jugular central venous catheter tip projects over the upper SVC.  Nasogastric tube courses below the diaphragm into the stomach.  Suboptimal inspiration with worsening atelectasis in the lung bases.  Lungs otherwise clear. Cardiac silhouette enlarged.  Pulmonary vascularity normal.  IMPRESSION: Support apparatus satisfactory.  Worsening bibasilar atelectasis. Stable cardiomegaly without pulmonary edema.    Original Report Authenticated By: Deniece Portela, M.D.    Dg Chest Port 1 View  02/20/2012  *RADIOLOGY REPORT*  Clinical Data: Status post intubation.  PORTABLE CHEST - 1 VIEW  Comparison: One view chest 02/20/2012 at 02:06 a.m.  Findings: Endotracheal tube terminates 2.2 cm above the carina and could be pulled back 1-2 cm for more optimal positioning.  The right IJ line and NG tube are stable.  Aeration is slightly improved.  Lung volumes remain low with mild bibasilar atelectasis.  IMPRESSION:  1.  The endotracheal tube terminates 2.2 cm above the carina and could be pulled back 1-2 cm for more optimal positioning. 2.  Support apparatus is otherwise stable. 3.  Improved aeration with persistent low lung volumes and right greater than left basilar atelectasis.   Original Report Authenticated By: Resa Miner. MATTERN, M.D.     Anti-infectives: Anti-infectives     Start     Dose/Rate Route Frequency Ordered Stop   02/22/12 0000   vancomycin (VANCOCIN) IVPB 1000 mg/200 mL premix        1,000 mg 200 mL/hr over 60 Minutes Intravenous Every 12 hours 02/21/12 0929     02/21/12 1000   fluconazole (DIFLUCAN) IVPB 400 mg        400 mg 200 mL/hr over 60 Minutes Intravenous Every 24 hours 02/21/12 0929     02/20/12 1000   vancomycin (VANCOCIN) 1,500 mg in sodium chloride 0.9 % 500 mL IVPB        1,500 mg 250 mL/hr over 120 Minutes Intravenous Every 24 hours 02/19/12 0942 02/21/12 1246   02/19/12 1000   piperacillin-tazobactam (ZOSYN) IVPB 3.375 g        3.375 g 12.5 mL/hr over 240 Minutes Intravenous 3 times per day 02/19/12 0942     02/19/12 1000   vancomycin (VANCOCIN) 2,000 mg in sodium chloride 0.9 % 500 mL IVPB        2,000 mg 250 mL/hr over 120 Minutes Intravenous  Once 02/19/12 0942 02/19/12 1335   02/16/12 1615   ertapenem (INVANZ) 1 g in sodium chloride 0.9 % 50 mL IVPB        1 g 100 mL/hr over 30 Minutes Intravenous To Surgery 02/16/12 1606 02/16/12 1733   02/16/12 1600    ertapenem (INVANZ) 1 g in sodium chloride 0.9 % 50 mL IVPB  Status:  Discontinued        1 g 100 mL/hr over 30 Minutes Intravenous Every 24 hours 02/16/12 1508 02/19/12 0909   02/16/12 1415   cefTRIAXone (ROCEPHIN) 1 g in dextrose 5 % 50 mL IVPB        1 g 100 mL/hr over 30 Minutes Intravenous  Once 02/16/12 1407 02/16/12 1448          Assessment Principal Problem:  1. s/p expl lap with resection of ileocolonic anastomosis for leak 9/15- to OR tomorrow for possible closure.  Diuresis in progress.     2. VDRF-per CCM   3. ARF-resolved.  4.  Prolonged  ileus.  Start TNA and check prealbumin.  Likely with severe protein calorie malnutrition.   Will need PICC>    LOS: 6 days     [facility] 02/22/2012

## 2012-02-22 NOTE — Progress Notes (Signed)
Nutrition Follow-up  Intervention:    TPN per pharmacy RD to follow for nutrition care plan  Assessment:   Patient remains intubated on ventilator support MV: 8.7 Temp: 37.8   S/p re-exploration of abdomen with application of VAC [id number].  Patient to receive TPN with Clinimix E 5/15 @ 50 ml/hr.  Lipids, multivitamins, and trace elements are provided 3 times weekly (MWF) due to national backorder -- no lipids in tonight's formulation. Provides 852 kcal and 60 grams protein daily (based on weekly average).  Meets 39% minimum estimated kcal and 43% minimum estimated protein needs.  Diet Order:  NPO  Meds: Scheduled Meds:   . antiseptic oral rinse  15 mL Mouth Rinse QID  . chlorhexidine  15 mL Mouth/Throat BID  . fluconazole (DIFLUCAN) IV  400 mg Intravenous Q24H  . insulin aspart  0-20 Units Subcutaneous Q4H  . pantoprazole (PROTONIX) IV  40 mg Intravenous QHS  . piperacillin-tazobactam (ZOSYN)  IV  3.375 g Intravenous Q8H  . sodium chloride  3 mL Intravenous Q12H  . vancomycin  1,500 mg Intravenous Q24H  . vancomycin  1,000 mg Intravenous Q12H  . DISCONTD: haloperidol lactate  5 mg Intravenous Q6H   Continuous Infusions:   . sodium chloride 20 mL/hr at 02/22/12 0800  . dexmedetomidine 1.2 mcg/kg/hr (02/22/12 1144)  . fentaNYL infusion INTRAVENOUS 400 mcg/hr (02/22/12 0818)  . TPN (CLINIMIX) +/- additives     PRN Meds:.fentaNYL, midazolam  Labs:  CMP     Component Value Date/Time   NA 141 02/22/2012 0418   K 5.2* 02/22/2012 0418   CL 112 02/22/2012 0418   CO2 21 02/22/2012 0418   GLUCOSE 153* 02/22/2012 0418   BUN 29* 02/22/2012 0418   CREATININE 1.35 02/22/2012 0418   CREATININE 1.12 07/04/2011 1516   CALCIUM 7.4* 02/22/2012 0418   PROT 6.8 02/16/2012 1258   ALBUMIN 2.6* 02/16/2012 1258   AST 32 02/16/2012 1258   ALT 25 02/16/2012 1258   ALKPHOS 66 02/16/2012 1258   BILITOT 1.1 02/16/2012 1258   GFRNONAA 56* 02/22/2012 0418   GFRAA 64* 02/22/2012 0418    Phosphorus  Date  Value Range Status  02/22/2012 3.5  2.3 - 4.6 mg/dL Final    Magnesium  Date Value Range Status  02/21/2012 2.1  1.5 - 2.5 mg/dL Final     Intake/Output Summary (Last 24 hours) at 02/22/12 1215 Last data filed at 02/22/12 1000  Gross per 24 hour  Intake   2705 ml  Output   3060 ml  Net   -355 ml    CBG (last 3)   Basename 02/22/12 0735 02/22/12 0416 02/21/12 2335  GLUCAP 120* 139* 136*    Weight Status:  106.3 kg (9/21) -- fluctuating  Re-estimated needs:  2190 kcals, 140-150 gm protein  Nutrition Dx:  Inadequate Oral Intake r/t inability to eat as evidenced by NPO status, ongoing  New Goal:  TPN to meet >90% of estimated protein needs, maximize energy provision as able during national lipid backorder, currently unmet  Monitor:  TPN prescription, respiratory status, weight, labs, I/O's  Phillips Odor, RD, LDN Pager #: [phone] After-Hours Pager #: [phone]

## 2012-02-22 NOTE — Progress Notes (Signed)
Pt mother called about OR time for pt on 02/23/12.  Reviewed OR schedule and there is nothing on the schedule at this time.  Advised pt mother and suggested she call again in the morning.

## 2012-02-22 NOTE — Progress Notes (Signed)
QTc at 0800 this am = 0.34.  Scheduled haloperidol has been discontinued.

## 2012-02-22 NOTE — Progress Notes (Signed)
Pt QTc prior to haldol administration 0.39.

## 2012-02-22 NOTE — Progress Notes (Signed)
Name: [name] MRN: [id number] DOB: [birth year]    LOS: 6 days Date of admit: 02/16/2012 PCP is [name], MD   Referring Provider:  [name] of [facility] Reason for Referral:  Post op resp failure, metabolic acidosis and hypotension   PULMONARY / CRITICAL CARE MEDICINE  HPI: 61 yo male smoker s/p colonoscopy and found to have 3-4 cm sessile mass in distal transverse colon.  Had laparoscopic Rt hemicolectomy 9/05, and d/c home 9/12.  He developed N/V and abd pain, and found to have pneumoperitoneum 9/15 from anastomotic leak.  He had ileocolonic resection, ileostomy, and wound vac.  He remained on vent post-op, and PCCM consulted.      DEVICES ETT 02/16/2012 >> 02/20/12 (self extubated post OR)  ETT 02/20/12 >> Rt IJ 02/16/2012>> A line 02/16/2012 >>02/19/12   Events Since Admission: 02/16/2012 >>Laparotomy  SUBJECTIVE: Remains on vent, sedated.  Vital Signs: Temp:  [98 F (36.7 C)-100.6 F (38.1 C)] 98.4 F (36.9 C) (09/21 1508) Pulse Rate:  [58-83] 59  (09/21 1500) Resp:  [13-18] 14  (09/21 1500) BP: (99-136)/(53-72) 112/56 mmHg (09/21 1500) SpO2:  [100 %] 100 % (09/21 1500) FiO2 (%):  [29.9 %-40.3 %] 29.9 % (09/21 1500) Weight:  [234 lb 8 oz (106.369 kg)] 234 lb 8 oz (106.369 kg) (09/21 0600)  Physical Examination: General - no distress HEENT - ett in place Cardiac - s1s2 regular, no murmur Chest - no wheeze Abd - soft, mildly distended, ileostomy and wound vac in place Ext - no edema Neuro - sedated  Dg Chest Port 1 View  02/22/2012  *RADIOLOGY REPORT*  Clinical Data: Endotracheal tube positioning.  Ventilator dependent respiratory failure.  Recent partial colectomy.  PORTABLE CHEST - 1 VIEW  Comparison: 02/21/2012  Findings: Endotracheal tube tip is 3.8 cm above the carina. Nasogastric tube extends into the stomach.  The right internal jugular line appears to be in the vicinity of the SVC confluence.  There is a suggestion of left ventricular  enlargement based on cardiac contour. Low lung volumes are present, causing crowding of the pulmonary vasculature.  Vague density at the right lung base does not silhouette the hemidiaphragm and could represent layering effusion.  There is minimal airspace opacity medially at the left lung base as well.  IMPRESSION:  1.  Endotracheal tube is satisfactorily positioned, tip 3.8 cm above the carina. 2.  Right IJ line tip is at the confluence of the SVC. 3.  Mild retrocardiac airspace opacity in the left lower lobe, potentially atelectasis or pneumonia. 4.  Hazy opacity over the right lung base, query small layering pleural effusion.   Original Report Authenticated By: Carron Curie, M.D.    Dg Chest Port 1 View  02/21/2012  *RADIOLOGY REPORT*  Clinical Data: Ventilator dependent respiratory failure.  Recent partial colectomy.  Follow up basilar atelectasis.  PORTABLE CHEST - 1 VIEW  Comparison: Portable chest x-rays yesterday and dating back to 02/17/2012.  Findings: Endotracheal tube tip in satisfactory position projecting approximately 5 cm above the carina.  Right jugular central venous catheter tip projects over the upper SVC.  Nasogastric tube courses below the diaphragm into the stomach.  Suboptimal inspiration with worsening atelectasis in the lung bases.  Lungs otherwise clear. Cardiac silhouette enlarged.  Pulmonary vascularity normal.  IMPRESSION: Support apparatus satisfactory.  Worsening bibasilar atelectasis. Stable cardiomegaly without pulmonary edema.   Original Report Authenticated By: Deniece Portela, M.D.     ASSESSMENT AND PLAN  PULMONARY  Lab 02/20/12  1209 02/19/12 0416 02/18/12 0411 02/17/12 0850 02/17/12 0423 02/16/12 2057  PHART 7.418 7.382 7.310* -- 7.328* 7.325*  PCO2ART 31.7* 31.8* 30.5* -- 31.0* 31.1*  PO2ART 107.0* 159.0* 126.0* -- 193.0* 123.0*  HCO3 20.5 18.8* 15.4* -- 16.4* 16.3*  O2SAT 98.0 99.0 99.0 76.0 100.0 --   Ventilator Settings: Vent Mode:  [-]  PRVC FiO2 (%):  [29.9 %-40.3 %] 29.9 % Set Rate:  [12 bmp-18 bmp] 12 bmp Vt Set:  [600 mL] 600 mL PEEP:  [4.9 cmH20-5 cmH20] 5 cmH20 Plateau Pressure:  [17 cmH20-19 cmH20] 18 cmH20   A:  Acute respiratory failure in the setting of peritonitis. Hx of OSA. P:   Continue vent support until abdominal issues stable F/u CXR Titrate oxygen to keep SpO2 > 92%  CARDIOVASCULAR  Lab 02/21/12 0839 02/19/12 0930 02/17/12 0420 02/16/12 1935 02/16/12 1504 02/16/12 1450  TROPONINI -- -- -- -- <0.30 --  LATICACIDVEN -- 1.3 1.3 2.0 -- 3.4*  PROBNP 295.9* -- -- -- -- --   Rt IJ CVL 9/15>>  A: Septic shock from peritonitis>>resolved. Hx of HTN. P:  Keep in even fluid balance Goal CVP < 10 Hold anti-HTN meds for now  RENAL  Lab 02/22/12 0418 02/21/12 0400 02/20/12 0445 02/19/12 0406 02/18/12 0405 02/17/12 0420  NA 141 139 143 141 140 --  K 5.2* 4.9 -- -- -- --  CL 112 110 113* 113* 115* --  CO2 21 21 23  18* 16* --  BUN 29* 25* 30* 38* 46* --  CREATININE 1.35 1.28 1.33 1.60* 2.24* --  CALCIUM 7.4* 7.2* 7.2* 6.9* 6.5* --  MG -- 2.1 2.3 2.1 1.6 1.4*  PHOS 3.5 3.1 2.6 3.3 4.2 --   Intake/Output      09/20 0701 - 09/21 0700 09/21 0701 - 09/22 0700   I.V. (mL/kg) 2120 (19.9) 740 (7)   NG/GT 120 30   IV Piggyback 1050 475   Total Intake(mL/kg) 3290 (30.9) 1245 (11.7)   Urine (mL/kg/hr) 2020 (0.8) 745 (0.8)   Emesis/NG output 550 100   Drains 750 250   Stool 50    Blood     Total Output 3370 1095   Net -80 +150         Foley: 02/16/2012   A: Acute renal failure from shock>>improving.  Metabolic acidosis>>resolved. P Monitor renal fx, urine outpt, electrolytes  GASTROINTESTINAL  Lab 02/16/12 1258  AST 32  ALT 25  ALKPHOS 66  BILITOT 1.1  PROT 6.8  ALBUMIN 2.6*    A: Acute peritonitis 2nd to anastomotic leak.    Nutrition. P:   Plan for trip to OR 9/22 Continue TPN  HEMATOLOGIC  Lab 02/22/12 0418 02/21/12 0400 02/20/12 0445 02/18/12 0405 02/17/12 0420 02/16/12  1504  HGB 9.9* 10.2* 9.8* 9.6* 9.9* --  HCT 28.8* 29.0* 27.6* 26.7* 27.9* --  PLT 286 270 322 367 349 --  INR -- -- -- -- -- 1.32  APTT -- -- -- -- -- 33   A: Anemia of critical illness. P:  F/u CBC Transfuse for Hb < 7  INFECTIOUS  Lab 02/22/12 0418 02/21/12 0400 02/20/12 0445 02/19/12 0930 02/19/12 0406 02/18/12 0405 02/17/12 0420 02/16/12 1935  WBC 14.3* 13.8* 13.0* -- -- 15.6* 16.2* --  PROCALCITON 2.55 -- -- 8.20 9.73 -- -- 28.65   Cultures: Urine 9/15>>negative Blood 9/18>> Urine 9/18>>negative Sputum 9/18>>  Antibiotics: Rocephin 9/15>>9/15 Ertapenem 9/15>>9/17 Zosyn 9/18>> Vancomycin 9/18>> Diflucan 9/20>>  A: Septic shock from peritonitis.  Procalcitonin trending down.  Recurrent fever 9/18 and 9/20>>improved with change in Abx and adding anti-fungal. P:   Abx as above  ENDOCRINE  Lab 02/22/12 0735 02/22/12 0416 02/21/12 2335 02/21/12 1947 02/21/12 1633  GLUCAP 120* 139* 136* 117* 106*   A: DM type II P:   SSI  NEUROLOGIC  A: Sedation.  P:   D/c scheduled haldol Continue precedex and fentanyl gtt Versed prn  BEST PRACTICE / DISPOSITION Level of Care:  ICU Primary Service:  CCS Consultants:  PCCM Code Status:  Full Diet:  TPN DVT Px:  SCD GI Px:  Protonix  Critical care time 35 minutes.  Chesley Mires, MD [facility] Pulmonary/Critical Care 02/22/2012, 4:03 PM Pager:  [phone] After 3pm call: [phone]

## 2012-02-22 NOTE — Progress Notes (Signed)
PARENTERAL NUTRITION CONSULT NOTE - INITIAL  Pharmacy Consult for TPN Indication: Prolonged ileus  No Known Allergies  Patient Measurements: Height: 5\' 11"  (180.3 cm) Weight: 234 lb 8 oz (106.369 kg) IBW/kg (Calculated) : 75.3  Ideal Body Weight: 80 Usual Weight: 117kg  Vital Signs: Temp: 100.1 F (37.8 C) (09/21 0736) Temp src: Oral (09/21 0736) BP: 114/59 mmHg (09/21 1000) Pulse Rate: 58  (09/21 1000) Intake/Output from previous day: 09/20 0701 - 09/21 0700 In: 3290 [I.V.:2120; NG/GT:120; IV Piggyback:1050] Out: 3370 [Urine:2020; Emesis/NG output:550; Drains:750; Stool:50] Intake/Output from this shift: Total I/O In: 532.5 [I.V.:277.5; NG/GT:30; IV Piggyback:225] Out: 545 [Urine:395; Emesis/NG output:50; Drains:100]  Labs:  [facility] 02/22/12 0418 02/21/12 0400 02/20/12 0445  WBC 14.3* 13.8* 13.0*  HGB 9.9* 10.2* 9.8*  HCT 28.8* 29.0* 27.6*  PLT 286 270 322  APTT -- -- --  INR -- -- --     Basename 02/22/12 0418 02/21/12 0400 02/20/12 0445  NA 141 139 143  K 5.2* 4.9 4.6  CL 112 110 113*  CO2 21 21 23   GLUCOSE 153* 101* 97  BUN 29* 25* 30*  CREATININE 1.35 1.28 1.33  LABCREA -- -- --  CREAT24HRUR -- -- --  CALCIUM 7.4* 7.2* 7.2*  MG -- 2.1 2.3  PHOS 3.5 3.1 2.6  PROT -- -- --  ALBUMIN -- -- --  AST -- -- --  ALT -- -- --  ALKPHOS -- -- --  BILITOT -- -- --  BILIDIR -- -- --  IBILI -- -- --  PREALBUMIN -- -- --  TRIG -- -- --  CHOLHDL -- -- --  CHOL -- -- --   Estimated Creatinine Clearance: 72.2 ml/min (by C-G formula based on Cr of 1.35).    Basename 02/22/12 0735 02/22/12 0416 02/21/12 2335  GLUCAP 120* 139* 136*    Medical History: Past Medical History  Diagnosis Date  . Hyperlipidemia   . Hypertension   . Heart murmur   . Diabetes mellitus     type 2 IDDM x 15 years  . Sleep apnea     does not wear CPAP now  . Colon polyp   . Arthritis     left knee  . 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 12/25/2011     Medications:  Prescriptions prior to admission  Medication Sig Dispense Refill  . atorvastatin (LIPITOR) 20 MG tablet Take 20 mg by mouth at bedtime.      . carvedilol (COREG) 6.25 MG tablet Take 1 tablet (6.25 mg total) by mouth 2 (two) times daily with a meal.  60 tablet  0  . insulin NPH-insulin regular (NOVOLIN 70/30) (70-30) 100 UNIT/ML injection Inject 10-30 Units into the skin 2 (two) times daily with a meal. Inject 30 units in the morning and 10 units at bedtime.      . metFORMIN (GLUCOPHAGE) 500 MG tablet Take 1,000 mg by mouth 2 (two) times daily with a meal.      . oxyCODONE-acetaminophen (PERCOCET/ROXICET) 5-325 MG per tablet Take 1-2 tablets by mouth every 6 (six) hours as needed.  30 tablet  0    Insulin Requirements in the past 24 hours:  8 units SSI  Current Nutrition:  NPO  IV Access:  Needs new PICC today for TPN  Assessment: 61 year old man underwent hemicolectomy on 9/5 and was discharged on 9/12.  He was readmitted 9/14 and underwent surgery for anastomatic leak and ileostomy on 9/15.  TPN to start for prolonged ileus and  prolonged fasting.  Labs today show K of 5.2, phos 3.5, Na 141.  Nutritional Goals:  2480 kCal, 140-150 grams of protein per day  Plan:  - Start Clinimix at 80mL/hr (no electrolyte formula due to elevated K).  Add 10 units of insulin per bag due to his history of type 2 diabetes.   - Formula will provide 60 g protein and 852 Kcal - CMET, Mg, Phos, TG, prealbumin, CBC in AM - continue silding scale insulin q4h - Multivitamins, trace elements, and lipids will be added on Monday, Wednesday and Friday only due to [employer].  Thank-you for this consult.  Heide Guile, PharmD, BCPS Clinical Pharmacist Pager [phone]   02/22/2012,10:17 AM

## 2012-02-23 ENCOUNTER — Encounter (HOSPITAL_COMMUNITY)
Admission: EM | Disposition: A | Discharge status: Skilled Nursing Facility | Payer: Self-pay | Source: Home / Self Care | Attending: Surgery

## 2012-02-23 ENCOUNTER — Encounter (HOSPITAL_COMMUNITY): Payer: Self-pay | Admitting: Anesthesiology

## 2012-02-23 ENCOUNTER — Inpatient Hospital Stay (HOSPITAL_COMMUNITY): Payer: Medicaid Other

## 2012-02-23 ENCOUNTER — Inpatient Hospital Stay (HOSPITAL_COMMUNITY): Payer: Medicaid Other | Admitting: Anesthesiology

## 2012-02-23 HISTORY — PX: INCISION AND DRAINAGE OF WOUND: SHX1803

## 2012-02-23 HISTORY — PX: VACUUM ASSISTED CLOSURE CHANGE: SHX5227

## 2012-02-23 HISTORY — PX: LAPAROTOMY: SHX154

## 2012-02-23 LAB — COMPREHENSIVE METABOLIC PANEL
ALT: 18 U/L (ref 0–53)
AST: 36 U/L (ref 0–37)
Albumin: 1.3 g/dL — ABNORMAL LOW (ref 3.5–5.2)
Alkaline Phosphatase: 87 U/L (ref 39–117)
BUN: 26 mg/dL — ABNORMAL HIGH (ref 6–23)
CO2: 22 mEq/L (ref 19–32)
Calcium: 7.7 mg/dL — ABNORMAL LOW (ref 8.4–10.5)
Chloride: 109 mEq/L (ref 96–112)
Creatinine, Ser: 1.23 mg/dL (ref 0.50–1.35)
GFR calc Af Amer: 72 mL/min — ABNORMAL LOW (ref >90)
GFR calc non Af Amer: 62 mL/min — ABNORMAL LOW (ref >90)
Glucose, Bld: 116 mg/dL — ABNORMAL HIGH (ref 70–99)
Potassium: 4.9 mEq/L (ref 3.5–5.1)
Sodium: 138 mEq/L (ref 135–145)
Total Bilirubin: 2.1 mg/dL — ABNORMAL HIGH (ref 0.3–1.2)
Total Protein: 5.6 g/dL — ABNORMAL LOW (ref 6.0–8.3)

## 2012-02-23 LAB — PHOSPHORUS: Phosphorus: 3.5 mg/dL (ref 2.3–4.6)

## 2012-02-23 LAB — GLUCOSE, CAPILLARY
Glucose-Capillary: 106 mg/dL — ABNORMAL HIGH (ref 70–99)
Glucose-Capillary: 112 mg/dL — ABNORMAL HIGH (ref 70–99)
Glucose-Capillary: 132 mg/dL — ABNORMAL HIGH (ref 70–99)
Glucose-Capillary: 136 mg/dL — ABNORMAL HIGH (ref 70–99)
Glucose-Capillary: 171 mg/dL — ABNORMAL HIGH (ref 70–99)
Glucose-Capillary: 79 mg/dL (ref 70–99)
Glucose-Capillary: 85 mg/dL (ref 70–99)

## 2012-02-23 LAB — TRIGLYCERIDES: Triglycerides: 209 mg/dL — ABNORMAL HIGH (ref <150)

## 2012-02-23 LAB — CBC
HCT: 26.5 % — ABNORMAL LOW (ref 39.0–52.0)
Hemoglobin: 9.3 g/dL — ABNORMAL LOW (ref 13.0–17.0)
MCH: 25.3 pg — ABNORMAL LOW (ref 26.0–34.0)
MCHC: 35.1 g/dL (ref 30.0–36.0)
MCV: 72 fL — ABNORMAL LOW (ref 78.0–100.0)
Platelets: 294 10*3/uL (ref 150–400)
RBC: 3.68 MIL/uL — ABNORMAL LOW (ref 4.22–5.81)
RDW: 20.3 % — ABNORMAL HIGH (ref 11.5–15.5)
WBC: 14 10*3/uL — ABNORMAL HIGH (ref 4.0–10.5)

## 2012-02-23 LAB — TROPONIN I: Troponin I: <0.3 ng/mL (ref <0.30)

## 2012-02-23 LAB — MAGNESIUM: Magnesium: 1.9 mg/dL (ref 1.5–2.5)

## 2012-02-23 IMAGING — CR DG CHEST 1V PORT
1 series · 1 of 1 positions shown · non-contrast
Comparison: Chest x-ray [DATE].

CLINICAL DATA: Follow-up evaluation for pleural effusions.

PORTABLE CHEST - 1 VIEW

[AP]
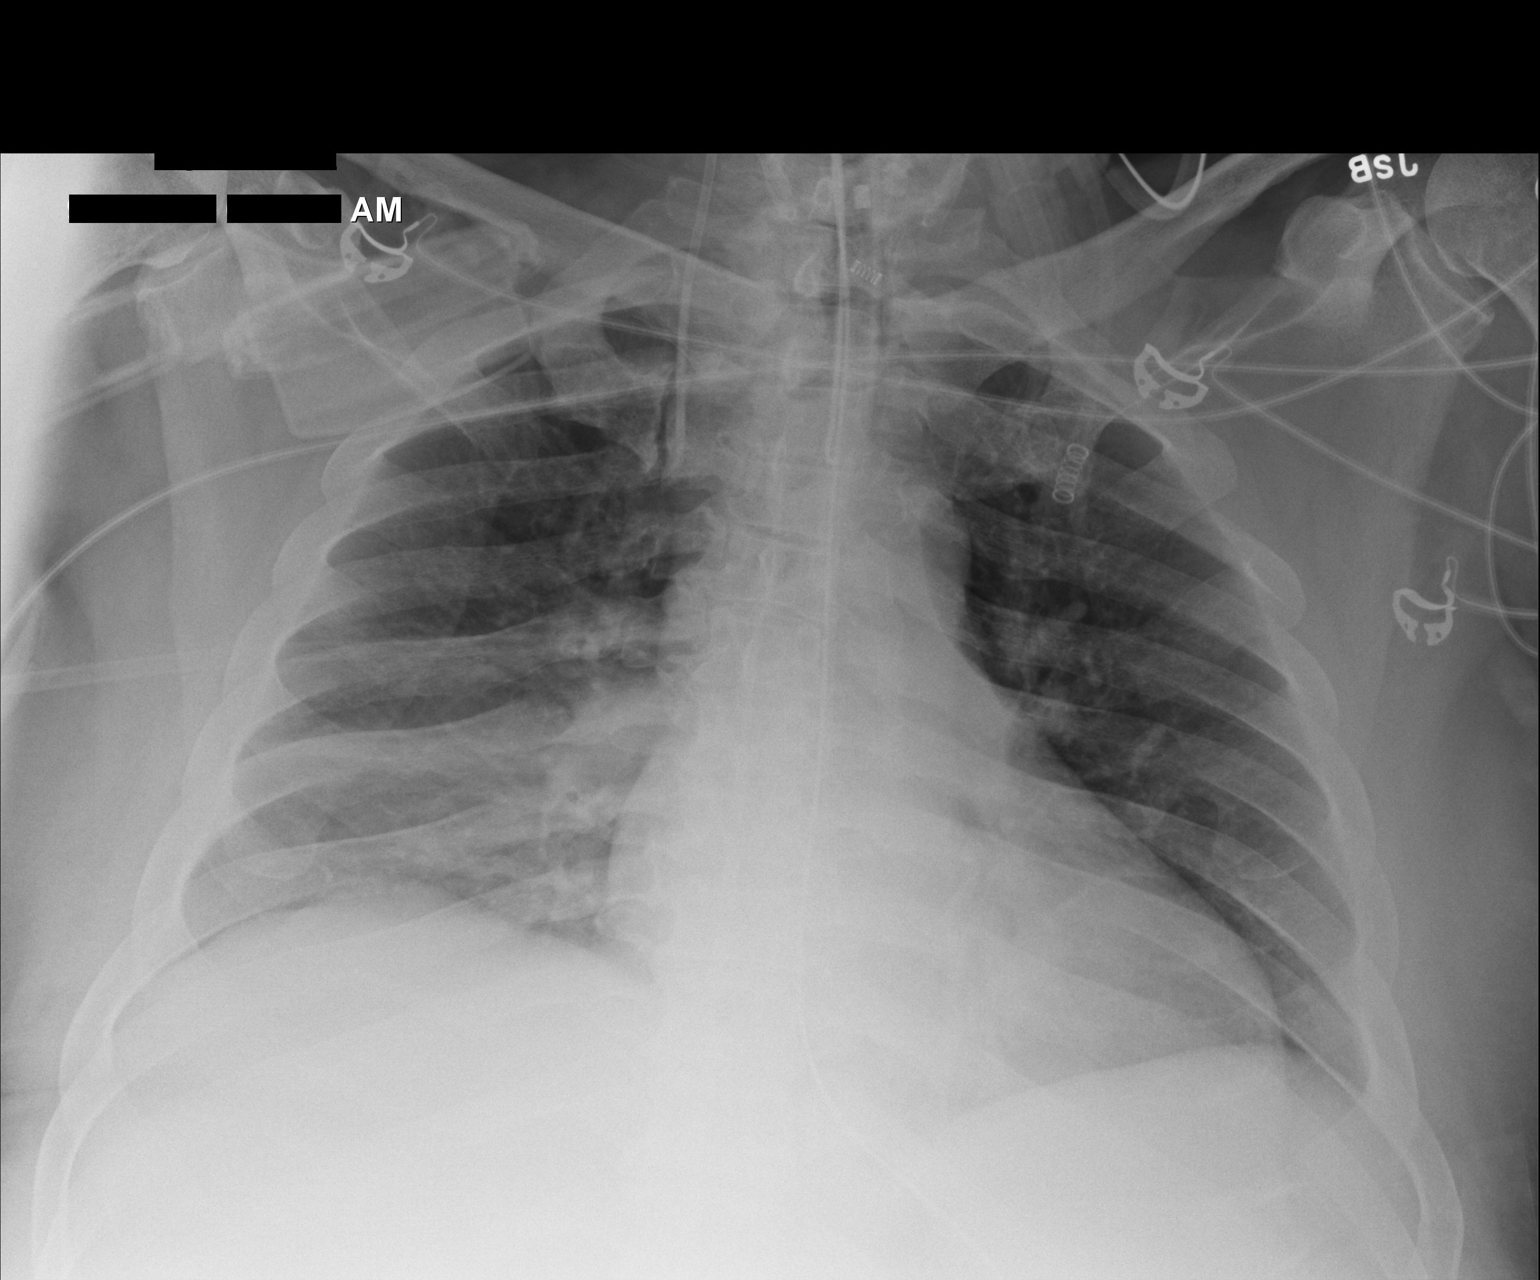

[1 of 1 positions shown; findings below may reference images not displayed]

FINDINGS: An endotracheal tube is in place with tip 4.1 cm above
the carina. A nasogastric tube is seen extending into the stomach,
however, the tip of the nasogastric tube extends below the lower
margin of the image.  Lung volumes are low.  No consolidative
airspace disease.  Slight hazy opacity projecting over the lower
right hemithorax may represent a small posterior layering right-
sided pleural effusion.  No left pleural effusion.  No
pneumothorax.  Left lower lobe subsegmental atelectasis.  Pulmonary
vasculature and cardiomediastinal silhouette are within normal
limits.
IMPRESSION: 1.  Support apparatus, as above.
2.  Possible trace right sided pleural effusion layering
posteriorly.
3.  Minimal left lower lobe subsegmental atelectasis.

## 2012-02-23 SURGERY — LAPAROTOMY, EXPLORATORY
Anesthesia: General | Site: Abdomen | Wound class: Dirty or Infected

## 2012-02-23 MED ORDER — MIDAZOLAM HCL 2 MG/2ML IJ SOLN
4.0000 mg | Freq: Once | INTRAMUSCULAR | Status: AC
  Administered 2012-02-23: 4 mg via INTRAVENOUS

## 2012-02-23 MED ORDER — 0.9 % SODIUM CHLORIDE (POUR BTL) OPTIME
TOPICAL | Status: DC | PRN
  Administered 2012-02-23: 2000 mL

## 2012-02-23 MED ORDER — LACTATED RINGERS IV SOLN
INTRAVENOUS | Status: DC | PRN
  Administered 2012-02-23: 10:00:00 via INTRAVENOUS

## 2012-02-23 MED ORDER — SODIUM CHLORIDE 0.9 % IJ SOLN
10.0000 mL | Freq: Two times a day (BID) | INTRAMUSCULAR | Status: DC
  Administered 2012-02-23 – 2012-02-27 (×3): 10 mL
  Administered 2012-02-27: 20 mL
  Administered 2012-02-28: 10 mL
  Administered 2012-02-29: 20 mL
  Administered 2012-02-29: 10 mL
  Administered 2012-03-01: 30 mL
  Administered 2012-03-01 – 2012-03-04 (×7): 10 mL

## 2012-02-23 MED ORDER — PHENYLEPHRINE HCL 10 MG/ML IJ SOLN
10.0000 mg | INTRAVENOUS | Status: DC | PRN
  Administered 2012-02-23: 50 ug/min via INTRAVENOUS

## 2012-02-23 MED ORDER — INSULIN REGULAR HUMAN 100 UNIT/ML IJ SOLN
INTRAVENOUS | Status: AC
  Administered 2012-02-23: 18:00:00 via INTRAVENOUS
  Filled 2012-02-23: qty 2000

## 2012-02-23 MED ORDER — SODIUM CHLORIDE 0.9 % IV BOLUS (SEPSIS)
1000.0000 mL | Freq: Once | INTRAVENOUS | Status: AC
  Administered 2012-02-23: 1000 mL via INTRAVENOUS

## 2012-02-23 MED ORDER — HALOPERIDOL LACTATE 5 MG/ML IJ SOLN
5.0000 mg | Freq: Four times a day (QID) | INTRAMUSCULAR | Status: DC | PRN
  Administered 2012-02-28: 5 mg via INTRAVENOUS
  Filled 2012-02-23: qty 1

## 2012-02-23 MED ORDER — EPHEDRINE SULFATE 50 MG/ML IJ SOLN
INTRAMUSCULAR | Status: DC | PRN
  Administered 2012-02-23 (×2): 5 mg via INTRAVENOUS
  Administered 2012-02-23: 10 mg via INTRAVENOUS
  Administered 2012-02-23: 5 mg via INTRAVENOUS

## 2012-02-23 MED ORDER — ROCURONIUM BROMIDE 100 MG/10ML IV SOLN
INTRAVENOUS | Status: DC | PRN
  Administered 2012-02-23 (×2): 50 mg via INTRAVENOUS

## 2012-02-23 MED ORDER — ATROPINE SULFATE 1 MG/ML IJ SOLN
INTRAMUSCULAR | Status: AC
  Filled 2012-02-23: qty 1

## 2012-02-23 MED ORDER — SODIUM CHLORIDE 0.9 % IJ SOLN
10.0000 mL | INTRAMUSCULAR | Status: DC | PRN
  Administered 2012-02-28 – 2012-03-07 (×3): 10 mL
  Administered 2012-03-07 – 2012-03-09 (×5): 20 mL

## 2012-02-23 MED ORDER — MIDAZOLAM HCL 5 MG/ML IJ SOLN
5.0000 mg | Freq: Once | INTRAMUSCULAR | Status: AC
  Administered 2012-02-23: 5 mg via INTRAVENOUS

## 2012-02-23 MED ORDER — NOREPINEPHRINE BITARTRATE 1 MG/ML IJ SOLN
2.0000 ug/min | INTRAMUSCULAR | Status: DC
  Administered 2012-02-23: 2 ug/min via INTRAVENOUS
  Filled 2012-02-23: qty 4

## 2012-02-23 MED ORDER — FUROSEMIDE 10 MG/ML IJ SOLN
20.0000 mg | Freq: Once | INTRAMUSCULAR | Status: AC
  Administered 2012-02-23: 20 mg via INTRAVENOUS

## 2012-02-23 SURGICAL SUPPLY — 37 items
BLADE SURG ROTATE 9660 (MISCELLANEOUS) IMPLANT
CANISTER SUCTION 2500CC (MISCELLANEOUS) ×2 IMPLANT
CANISTER WOUND CARE 500ML ATS (WOUND CARE) ×1 IMPLANT
CHLORAPREP W/TINT 26ML (MISCELLANEOUS) ×1 IMPLANT
CLOTH BEACON ORANGE TIMEOUT ST (SAFETY) ×2 IMPLANT
COVER SURGICAL LIGHT HANDLE (MISCELLANEOUS) ×2 IMPLANT
DRAPE LAPAROSCOPIC ABDOMINAL (DRAPES) ×2 IMPLANT
DRAPE UTILITY 15X26 W/TAPE STR (DRAPE) ×2 IMPLANT
DRAPE WARM FLUID 44X44 (DRAPE) ×2 IMPLANT
ELECT REM PT RETURN 9FT ADLT (ELECTROSURGICAL) ×2
ELECTRODE REM PT RTRN 9FT ADLT (ELECTROSURGICAL) ×1 IMPLANT
GLOVE BIO SURGEON STRL SZ8 (GLOVE) ×3 IMPLANT
GLOVE BIOGEL PI IND STRL 8 (GLOVE) ×1 IMPLANT
GLOVE BIOGEL PI INDICATOR 8 (GLOVE) ×2
GOWN STRL NON-REIN LRG LVL3 (GOWN DISPOSABLE) ×4 IMPLANT
KIT BASIN OR (CUSTOM PROCEDURE TRAY) ×2 IMPLANT
KIT COLOSTOMY ILEOSTOMY 2.75 (WOUND CARE) ×2 IMPLANT
KIT ROOM TURNOVER OR (KITS) ×2 IMPLANT
LIGASURE IMPACT 36 18CM CVD LR (INSTRUMENTS) ×1 IMPLANT
NS IRRIG 1000ML POUR BTL (IV SOLUTION) ×2 IMPLANT
PACK GENERAL/GYN (CUSTOM PROCEDURE TRAY) ×2 IMPLANT
PAD ARMBOARD 7.5X6 YLW CONV (MISCELLANEOUS) ×4 IMPLANT
SPECIMEN JAR LARGE (MISCELLANEOUS) IMPLANT
SPONGE ABDOMINAL VAC ABTHERA (MISCELLANEOUS) ×1 IMPLANT
SPONGE GAUZE 4X4 12PLY (GAUZE/BANDAGES/DRESSINGS) ×2 IMPLANT
SPONGE LAP 18X18 X RAY DECT (DISPOSABLE) IMPLANT
STAPLER VISISTAT 35W (STAPLE) ×2 IMPLANT
SUCTION POOLE TIP (SUCTIONS) IMPLANT
SUT NOVA 1 T20/GS 25DT (SUTURE) ×2 IMPLANT
SUT PDS AB 1 CTX 36 (SUTURE) IMPLANT
SUT VIC AB 3-0 SH 18 (SUTURE) ×2 IMPLANT
SUT VICRYL AB 3 0 TIES (SUTURE) ×2 IMPLANT
TOWEL OR 17X24 6PK STRL BLUE (TOWEL DISPOSABLE) ×2 IMPLANT
TOWEL OR 17X26 10 PK STRL BLUE (TOWEL DISPOSABLE) ×2 IMPLANT
TRAY FOLEY CATH 14FRSI W/METER (CATHETERS) IMPLANT
WATER STERILE IRR 1000ML POUR (IV SOLUTION) ×2 IMPLANT
YANKAUER SUCT BULB TIP NO VENT (SUCTIONS) ×1 IMPLANT

## 2012-02-23 NOTE — Anesthesia Preprocedure Evaluation (Addendum)
Anesthesia Evaluation  Patient identified by MRN, date of birth, ID band Patient awake    Reviewed: Allergy & Precautions, H&P , NPO status , Patient's Chart, lab work & pertinent test results, reviewed documented beta blocker date and time   History of Anesthesia Complications Negative for: history of anesthetic complications  Airway Mallampati: II TM Distance: >3 FB Neck ROM: Full    Dental  (+) Teeth Intact, Edentulous Upper and Dental Advisory Given   Pulmonary sleep apnea and Continuous Positive Airway Pressure Ventilation , COPDCurrent Smoker,  Intubated + rhonchi   Pulmonary exam normal       Cardiovascular hypertension, Pt. on medications and Pt. on home beta blockers + Valvular Problems/Murmurs Rhythm:Regular Rate:Tachycardia     Neuro/Psych negative neurological ROS  negative psych ROS   GI/Hepatic negative GI ROS, Neg liver ROS, Recent lap partial colectomy now with free air    Endo/Other  diabetes, Well Controlled, Type 2, Oral Hypoglycemic Agents and Insulin Dependent  Renal/GU ARFRenal diseasenegative Renal ROSCreat 2.4 9/15   Now 1.4   negative genitourinary   Musculoskeletal negative musculoskeletal ROS (+)   Abdominal (+) + obese,  Abdomen: tender.    Peds  Hematology microcitic anemia   Anesthesia Other Findings   Reproductive/Obstetrics negative OB ROS                           Anesthesia Physical Anesthesia Plan  ASA: III and Emergent  Anesthesia Plan: General   Post-op Pain Management:    Induction: Intravenous  Airway Management Planned: Oral ETT  Additional Equipment: Arterial line  Intra-op Plan:   Post-operative Plan: Post-operative intubation/ventilation  Informed Consent: I have reviewed the patients History and Physical, chart, labs and discussed the procedure including the risks, benefits and alternatives for the proposed anesthesia with the  patient or authorized representative who has indicated his/her understanding and acceptance.     Plan Discussed with: CRNA, Surgeon and Anesthesiologist  Anesthesia Plan Comments:        Anesthesia Quick Evaluation

## 2012-02-23 NOTE — Progress Notes (Signed)
[facility] Progress Note Patient Name: [name] DOB: [birth year] MRN: [id number]  Date of Service  02/23/2012   HPI/Events of Note   Hypotensive and agitated after coming back from OR Net even today  eICU Interventions  Check CVP Bolus 1 L NS Increase sedation   Intervention Category Major Interventions: Hypotension - evaluation and management  MCQUAID, DOUGLAS 02/23/2012, 6:46 PM

## 2012-02-23 NOTE — Addendum Note (Signed)
Addendum  created 02/23/12 1226 by Leda Quail, MD   Modules edited:Notes Section

## 2012-02-23 NOTE — Progress Notes (Signed)
PARENTERAL NUTRITION CONSULT NOTE - Follow up  Pharmacy Consult for TPN Indication: Prolonged ileus  No Known Allergies  Patient Measurements: Height: 5\' 11"  (180.3 cm) Weight: 234 lb 8 oz (106.369 kg) IBW/kg (Calculated) : 75.3  Ideal Body Weight: 80 Usual Weight: 117kg  Vital Signs: Temp: 98.9 F (37.2 C) (09/22 0737) Temp src: Oral (09/22 0737) BP: 126/62 mmHg (09/22 1000) Pulse Rate: 54  (09/22 1000) Intake/Output from previous day: 09/21 0701 - 09/22 0700 In: 3015 [I.V.:2220; NG/GT:60; IV Piggyback:735] Out: 3085 [Urine:2185; Emesis/NG output:200; Drains:700] Intake/Output from this shift: Total I/O In: 557.5 [I.V.:277.5; NG/GT:30; IV Piggyback:250] Out: 530 [Urine:280; Emesis/NG output:150; Drains:100]  Labs:  Basename 02/23/12 0400 02/22/12 0418 02/21/12 0400  WBC 14.0* 14.3* 13.8*  HGB 9.3* 9.9* 10.2*  HCT 26.5* 28.8* 29.0*  PLT 294 286 270  APTT -- -- --  INR -- -- --     Basename 02/23/12 0400 02/22/12 1000 02/22/12 0418 02/21/12 0400  NA 138 -- 141 139  K 4.9 -- 5.2* 4.9  CL 109 -- 112 110  CO2 22 -- 21 21  GLUCOSE 116* -- 153* 101*  BUN 26* -- 29* 25*  CREATININE 1.23 -- 1.35 1.28  LABCREA -- -- -- --  CREAT24HRUR -- -- -- --  CALCIUM 7.7* -- 7.4* 7.2*  MG 1.9 -- -- 2.1  PHOS 3.5 -- 3.5 3.1  PROT 5.6* -- -- --  ALBUMIN 1.3* -- -- --  AST 36 -- -- --  ALT 18 -- -- --  ALKPHOS 87 -- -- --  BILITOT 2.1* -- -- --  BILIDIR -- -- -- --  IBILI -- -- -- --  PREALBUMIN -- 3.8* -- --  TRIG 209* -- -- --  CHOLHDL -- -- -- --  CHOL -- -- -- --   Estimated Creatinine Clearance: 79.2 ml/min (by C-G formula based on Cr of 1.23).    Basename 02/23/12 0736 02/23/12 0334 02/22/12 2358  GLUCAP 112* 106* 85     Insulin Requirements in the past 24 hours:  7 units SSI  Current Nutrition:  NPO  IV Access:  Needs new PICC today for TPN.  TPN not started 9/21 because PICC not placed until 9/22 in the AM.  Assessment: 61 year old man underwent  hemicolectomy on 9/5 and was discharged on 9/12.  He was readmitted 9/14 and underwent surgery for anastomatic leak and ileostomy on 9/15.  TPN to start for prolonged ileus and prolonged fasting.  Labs today show K of 5.2, phos 3.5, Na 141.  Nutritional Goals:  2190 kCal, 140-150 grams of protein per day - update by RD on 9/21  Infectious Disease: Vancomycin, fluconazole and Zosyn on board for peritonitis secondary to anastomotic leak.  Has been on antibiotics since 9/15.  Endocrinology: CBGs 85 to 176 today.  TPN has not been started yet.  Gastrointestinal / Nutrition: Brought to OR today for VAC change and possible closure of wound.  Nephrology: Creatinine 1.23.  UOP 0.8 mL/kg/hr.  Hematology / Oncology: H/H 9.3/26.5.  Platelets 294.  On SCD for VTE prophylaxis.   Plan:  - Start Clinimix at 31mL/hr (no electrolyte formula due to elevated K).  Add 10 units of insulin per bag due to his history of type 2 diabetes.   - Formula will provide 60 g protein and 852 Kcal - TNA labs in AM. - continue silding scale insulin q4h - Multivitamins, trace elements, and lipids will be added on Monday, Wednesday and Friday only due to national  shortage.  Thank-you for this consult.  Heide Guile, PharmD, BCPS Clinical Pharmacist Pager [phone]   02/23/2012,11:09 AM

## 2012-02-23 NOTE — Op Note (Signed)
Preoperative diagnosis: Open abdomen secondary to anastomotic leak after partial colectomy for colon cancer and intra-abdominal sepsis  Postoperative diagnosis: Same  Procedure: Exploratory laparotomy with washout of peritoneal cavity and partial closure of abdominal wall fascia with replacement of vacuum pack dressing  Surgeon: Erroll Luna M.D.  Anesthesia: Gen. Endotracheal anesthesia  EBL: Minimal  Specimen: None  Indications for procedure: The patient presents for reexploration and possible closure of fascia after undergoing a partial colectomy on 02/06/2012    Do to a stage II colon cancer. 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 He has undergone multiple procedures week since his abdomen could not be closed returns today for washout and possible closure.  Description of procedure: The patient was taken directly from the disc into the operating room. He was placed on the operating room table. After an appropriate level of anesthesia, a vacuum pack dressing was removed and the abdomen was prepped and draped in a sterile fashion. Upon inspection there is no contamination. Small bowel is viable. There is no abscess. The abdominal cavity was irrigated. The fascia was partially closed using #1 Novafil suture. There is too much edema to close it completely. Vacuum pack dressing reapplied. He was placed to suction with good seal. All final counts sponge, needle and this was found to be correct. He is taken back to the ICU in critical condition.

## 2012-02-23 NOTE — Addendum Note (Signed)
Addendum  created 02/23/12 1249 by Leda Quail, MD   Modules edited:Anesthesia Attestations, Notes Section

## 2012-02-23 NOTE — Progress Notes (Signed)
3 Days Post-Op  Subjective: ON VENT  NO MAJOR CHANGES  Objective: Vital signs in last 24 hours: Temp:  [97.6 F (36.4 C)-98.9 F (37.2 C)] 98.9 F (37.2 C) (09/22 0737) Pulse Rate:  [56-83] 60  (09/22 0732) Resp:  [12-28] 28  (09/22 0732) BP: (97-125)/(53-69) 120/65 mmHg (09/22 0732) SpO2:  [92 %-100 %] 96 % (09/22 0732) FiO2 (%):  [29.7 %-30.5 %] 30 % (09/22 0732) Last BM Date: 02/21/12 (ileostomy)  Intake/Output from previous day: 09/21 0701 - 09/22 0700 In: 3015 [I.V.:2220; NG/GT:60; IV Piggyback:735] Out: 3085 [Urine:2185; Emesis/NG output:200; Drains:700] Intake/Output this shift:    Incision/Wound:VAC IN PLACE  OSTOMY VIABLE  Lab Results:   Basename 02/23/12 0400 02/22/12 0418  WBC 14.0* 14.3*  HGB 9.3* 9.9*  HCT 26.5* 28.8*  PLT 294 286   BMET  Basename 02/23/12 0400 02/22/12 0418  NA 138 141  K 4.9 5.2*  CL 109 112  CO2 22 21  GLUCOSE 116* 153*  BUN 26* 29*  CREATININE 1.23 1.35  CALCIUM 7.7* 7.4*   PT/INR No results found for this basename: LABPROT:2,INR:2 in the last 72 hours ABG  Basename 02/20/12 1209  PHART 7.418  HCO3 20.5    Studies/Results: Dg Chest Port 1 View  02/23/2012  *RADIOLOGY REPORT*  Clinical Data: Follow-up evaluation for pleural effusions.  PORTABLE CHEST - 1 VIEW  Comparison: Chest x-ray 02/22/2012.  Findings: An endotracheal tube is in place with tip 4.1 cm above the carina. A nasogastric tube is seen extending into the stomach, however, the tip of the nasogastric tube extends below the lower margin of the image.  Lung volumes are low.  No consolidative airspace disease.  Slight hazy opacity projecting over the lower right hemithorax may represent a small posterior layering right- sided pleural effusion.  No left pleural effusion.  No pneumothorax.  Left lower lobe subsegmental atelectasis.  Pulmonary vasculature and cardiomediastinal silhouette are within normal limits.  IMPRESSION: 1.  Support apparatus, as above. 2.  Possible  trace right sided pleural effusion layering posteriorly. 3.  Minimal left lower lobe subsegmental atelectasis.   Original Report Authenticated By: Etheleen Mayhew, M.D.    Dg Chest Port 1 View  02/22/2012  *RADIOLOGY REPORT*  Clinical Data: Endotracheal tube positioning.  Ventilator dependent respiratory failure.  Recent partial colectomy.  PORTABLE CHEST - 1 VIEW  Comparison: 02/21/2012  Findings: Endotracheal tube tip is 3.8 cm above the carina. Nasogastric tube extends into the stomach.  The right internal jugular line appears to be in the vicinity of the SVC confluence.  There is a suggestion of left ventricular enlargement based on cardiac contour. Low lung volumes are present, causing crowding of the pulmonary vasculature.  Vague density at the right lung base does not silhouette the hemidiaphragm and could represent layering effusion.  There is minimal airspace opacity medially at the left lung base as well.  IMPRESSION:  1.  Endotracheal tube is satisfactorily positioned, tip 3.8 cm above the carina. 2.  Right IJ line tip is at the confluence of the SVC. 3.  Mild retrocardiac airspace opacity in the left lower lobe, potentially atelectasis or pneumonia. 4.  Hazy opacity over the right lung base, query small layering pleural effusion.   Original Report Authenticated By: Carron Curie, M.D.     Anti-infectives: Anti-infectives     Start     Dose/Rate Route Frequency Ordered Stop   02/22/12 0000   vancomycin (VANCOCIN) IVPB 1000 mg/200 mL premix  1,000 mg 200 mL/hr over 60 Minutes Intravenous Every 12 hours 02/21/12 0929     02/21/12 1000   fluconazole (DIFLUCAN) IVPB 400 mg        400 mg 200 mL/hr over 60 Minutes Intravenous Every 24 hours 02/21/12 0929     02/20/12 1000   vancomycin (VANCOCIN) 1,500 mg in sodium chloride 0.9 % 500 mL IVPB        1,500 mg 250 mL/hr over 120 Minutes Intravenous Every 24 hours 02/19/12 0942 02/21/12 1246   02/19/12 1000    piperacillin-tazobactam (ZOSYN) IVPB 3.375 g        3.375 g 12.5 mL/hr over 240 Minutes Intravenous 3 times per day 02/19/12 0942     02/19/12 1000   vancomycin (VANCOCIN) 2,000 mg in sodium chloride 0.9 % 500 mL IVPB        2,000 mg 250 mL/hr over 120 Minutes Intravenous  Once 02/19/12 0942 02/19/12 1335   02/16/12 1615   ertapenem (INVANZ) 1 g in sodium chloride 0.9 % 50 mL IVPB        1 g 100 mL/hr over 30 Minutes Intravenous To Surgery 02/16/12 1606 02/16/12 1733   02/16/12 1600   ertapenem (INVANZ) 1 g in sodium chloride 0.9 % 50 mL IVPB  Status:  Discontinued        1 g 100 mL/hr over 30 Minutes Intravenous Every 24 hours 02/16/12 1508 02/19/12 0909   02/16/12 1415   cefTRIAXone (ROCEPHIN) 1 g in dextrose 5 % 50 mL IVPB        1 g 100 mL/hr over 30 Minutes Intravenous  Once 02/16/12 1407 02/16/12 1448          Assessment/Plan: s/p Procedure(s) (LRB) with comments: EXPLORATORY LAPAROTOMY (N/A) ABDOMINAL VACUUM ASSISTED CLOSURE CHANGE () To OR for vac change and possible closure of abdomen today.  LOS: 7 days    [name] A. 02/23/2012

## 2012-02-23 NOTE — Progress Notes (Signed)
Notified Dr. Lake Bells at [facility] of pt's sustained hypotension and that I had already halved sedation rate without much change.  No new orders received at this time.  He will continue to monitor.

## 2012-02-23 NOTE — Progress Notes (Signed)
Name: [name] MRN: [id number] DOB: 09-18-1950    LOS: 7 days Date of admit: 02/16/2012 PCP is [name], MD   Referring Provider:  [name] of [facility] Reason for Referral:  Post op resp failure, metabolic acidosis and hypotension   PULMONARY / CRITICAL CARE MEDICINE  HPI: 61 yo male smoker s/p colonoscopy and found to have 3-4 cm sessile mass in distal transverse colon.  Had laparoscopic Rt hemicolectomy 9/05, and d/c home 9/12.  He developed N/V and abd pain, and found to have pneumoperitoneum 9/15 from anastomotic leak.  He had ileocolonic resection, ileostomy, and wound vac.  He remained on vent post-op, and PCCM consulted.  Events Since Admission: 02/16/2012 >>Laparotomy  SUBJECTIVE: Remains on vent, sedated.  Intermittently agitated.  Unable to get consent for PICC until late last night.  Vital Signs: Temp:  [97.6 F (36.4 C)-98.9 F (37.2 C)] 98.9 F (37.2 C) (09/22 0737) Pulse Rate:  [56-83] 60  (09/22 0732) Resp:  [12-28] 28  (09/22 0732) BP: (97-126)/(53-69) 120/65 mmHg (09/22 0732) SpO2:  [92 %-100 %] 96 % (09/22 0732) FiO2 (%):  [29.7 %-39.9 %] 30 % (09/22 0732)  Physical Examination: General - no distress HEENT - ett in place Cardiac - s1s2 regular, no murmur Chest - no wheeze Abd - soft, mildly distended, ileostomy and wound vac in place Ext - no edema Neuro - sedated  Dg Chest Port 1 View  02/22/2012  *RADIOLOGY REPORT*  Clinical Data: Endotracheal tube positioning.  Ventilator dependent respiratory failure.  Recent partial colectomy.  PORTABLE CHEST - 1 VIEW  Comparison: 02/21/2012  Findings: Endotracheal tube tip is 3.8 cm above the carina. Nasogastric tube extends into the stomach.  The right internal jugular line appears to be in the vicinity of the SVC confluence.  There is a suggestion of left ventricular enlargement based on cardiac contour. Low lung volumes are present, causing crowding of the pulmonary vasculature.  Vague density at  the right lung base does not silhouette the hemidiaphragm and could represent layering effusion.  There is minimal airspace opacity medially at the left lung base as well.  IMPRESSION:  1.  Endotracheal tube is satisfactorily positioned, tip 3.8 cm above the carina. 2.  Right IJ line tip is at the confluence of the SVC. 3.  Mild retrocardiac airspace opacity in the left lower lobe, potentially atelectasis or pneumonia. 4.  Hazy opacity over the right lung base, query small layering pleural effusion.   Original Report Authenticated By: Carron Curie, M.D.     ASSESSMENT AND PLAN  PULMONARY  Lab 02/20/12 1209 02/19/12 0416 02/18/12 0411 02/17/12 0850 02/17/12 0423 02/16/12 2057  PHART 7.418 7.382 7.310* -- 7.328* 7.325*  PCO2ART 31.7* 31.8* 30.5* -- 31.0* 31.1*  PO2ART 107.0* 159.0* 126.0* -- 193.0* 123.0*  HCO3 20.5 18.8* 15.4* -- 16.4* 16.3*  O2SAT 98.0 99.0 99.0 76.0 100.0 --   Ventilator Settings: Vent Mode:  [-] PRVC FiO2 (%):  [29.7 %-39.9 %] 30 % Set Rate:  [12 bmp] 12 bmp Vt Set:  [600 mL] 600 mL PEEP:  [4.9 cmH20-5 cmH20] 5 cmH20 Plateau Pressure:  [16 cmH20-23 cmH20] 23 cmH20  ETT 02/16/2012 >> 02/20/12 (self extubated post OR)  ETT 02/20/12 >>  A:  Acute respiratory failure in the setting of peritonitis. Hx of OSA. P:   Continue vent support until abdominal issues stable F/u CXR Titrate oxygen to keep SpO2 > 92%  CARDIOVASCULAR  Lab 02/21/12 0839 02/19/12 0930 02/17/12 0420 02/16/12 1935  02/16/12 1504 02/16/12 1450  TROPONINI -- -- -- -- <0.30 --  LATICACIDVEN -- 1.3 1.3 2.0 -- 3.4*  PROBNP 295.9* -- -- -- -- --   Rt IJ CVL 9/15>> A line 02/16/2012 >>02/19/12  A: Septic shock from peritonitis>>resolved. Hx of HTN. P:  Keep in even to negative fluid balance Give one dose lasix 20 mg 9/22 Goal CVP < 10 Hold anti-HTN meds for now For PICC line placement  RENAL  Lab 02/23/12 0400 02/22/12 0418 02/21/12 0400 02/20/12 0445 02/19/12 0406 02/18/12 0405  NA 138  141 139 143 141 --  K 4.9 5.2* -- -- -- --  CL 109 112 110 113* 113* --  CO2 22 21 21 23  18* --  BUN 26* 29* 25* 30* 38* --  CREATININE 1.23 1.35 1.28 1.33 1.60* --  CALCIUM 7.7* 7.4* 7.2* 7.2* 6.9* --  MG 1.9 -- 2.1 2.3 2.1 1.6  PHOS 3.5 3.5 3.1 2.6 3.3 --   Intake/Output      09/21 0701 - 09/22 0700 09/22 0701 - 09/23 0700   I.V. (mL/kg) 2220 (20.9)    NG/GT 60    IV Piggyback 735    Total Intake(mL/kg) 3015 (28.3)    Urine (mL/kg/hr) 2185 (0.9)    Emesis/NG output 200    Drains 700    Stool     Total Output 3085    Net -70          Foley: 02/16/2012   A: Acute renal failure from shock>>improving.  Metabolic acidosis>>resolved. P Monitor renal fx, urine outpt, electrolytes  GASTROINTESTINAL  Lab 02/23/12 0400 02/16/12 1258  AST 36 32  ALT 18 25  ALKPHOS 87 66  BILITOT 2.1* 1.1  PROT 5.6* 6.8  ALBUMIN 1.3* 2.6*    A: Acute peritonitis 2nd to anastomotic leak.    Nutrition. P:   Plan for trip to OR 9/22 Continue TPN  HEMATOLOGIC  Lab 02/23/12 0400 02/22/12 0418 02/21/12 0400 02/20/12 0445 02/18/12 0405 02/16/12 1504  HGB 9.3* 9.9* 10.2* 9.8* 9.6* --  HCT 26.5* 28.8* 29.0* 27.6* 26.7* --  PLT 294 286 270 322 367 --  INR -- -- -- -- -- 1.32  APTT -- -- -- -- -- 33   A: Anemia of critical illness. P:  F/u CBC Transfuse for Hb < 7  INFECTIOUS  Lab 02/23/12 0400 02/22/12 0418 02/21/12 0400 02/20/12 0445 02/19/12 0930 02/19/12 0406 02/18/12 0405 02/16/12 1935  WBC 14.0* 14.3* 13.8* 13.0* -- -- 15.6* --  PROCALCITON -- 2.55 -- -- 8.20 9.73 -- 28.65   Cultures: Urine 9/15>>negative Blood 9/18>> Urine 9/18>>negative Sputum 9/18>>Candida  Antibiotics: Rocephin 9/15>>9/15 Ertapenem 9/15>>9/17 Zosyn 9/18>> Vancomycin 9/18>> Diflucan 9/20>>  A: Septic shock from peritonitis.  Procalcitonin trending down.  Recurrent fever 9/18 and 9/20>>improved with change in Abx and adding anti-fungal. P:   Abx as above  ENDOCRINE  Lab 02/23/12 0334 02/22/12  2358 02/22/12 2023 02/22/12 1506 02/22/12 0735  GLUCAP 106* 85 125* 176* 120*   A: DM type II P:   SSI  NEUROLOGIC  A: Sedation.  P:   Continue precedex and fentanyl gtt Versed, haldol prn  BEST PRACTICE / DISPOSITION Level of Care:  ICU Primary Service:  CCS Consultants:  PCCM Code Status:  Full Diet:  TPN DVT Px:  SCD GI Px:  Protonix  Critical care time 35 minutes.  Chesley Mires, MD [facility] Pulmonary/Critical Care 02/23/2012, 7:58 AM Pager:  [phone] After 3pm call: [phone]

## 2012-02-23 NOTE — Transfer of Care (Signed)
Immediate Anesthesia Transfer of Care Note  Patient: [name]  Procedure(s) Performed: Procedure(s) (LRB) with comments: EXPLORATORY LAPAROTOMY (N/A) - Irrigation and Debridement of abdominal wound with wound vac change with partial closure IRRIGATION AND DEBRIDEMENT WOUND (N/A) ABDOMINAL VACUUM ASSISTED CLOSURE CHANGE (N/A)  Patient Location: SICU  Anesthesia Type: General  Level of Consciousness: sedated and unresponsive  Airway & Oxygen Therapy: Patient placed on Ventilator (see vital sign flow sheet for setting)  Post-op Assessment: Report given to PACU RN and Post -op Vital signs reviewed and stable  Post vital signs: Reviewed and stable  Complications: No apparent anesthesia complications

## 2012-02-23 NOTE — Progress Notes (Signed)
[facility] Progress Note Patient Name: [name] DOB: [birth year] MRN: [id number]  Date of Service  02/23/2012   HPI/Events of Note   Hypotensive post op despite fluids Also with high sedation requirements  eICU Interventions  Levophed Check ekg Check cardiac enzymes   Intervention Category Major Interventions: Hypotension - evaluation and management  Dexton Zwilling 02/23/2012, 8:21 PM

## 2012-02-23 NOTE — Anesthesia Postprocedure Evaluation (Signed)
  Anesthesia Post-op Note  Patient: [name]  Procedure(s) Performed: Procedure(s) (LRB) with comments: EXPLORATORY LAPAROTOMY (N/A) - Irrigation and Debridement of abdominal wound with wound vac change with partial closure IRRIGATION AND DEBRIDEMENT WOUND (N/A) ABDOMINAL VACUUM ASSISTED CLOSURE CHANGE (N/A)  Patient Location: SICU  Anesthesia Type: General  Level of Consciousness: sedated and patient cooperative  Airway and Oxygen Therapy: Patient placed on Ventilator (see vital sign flow sheet for setting)  Post-op Pain: none  Post-op Assessment: Post-op Vital signs reviewed, Patient's Cardiovascular Status Stable, Respiratory Function Stable and Patent Airway  Post-op Vital Signs: Reviewed and stable  Complications: No apparent anesthesia complications

## 2012-02-23 NOTE — Anesthesia Procedure Notes (Signed)
Date/Time: 02/23/2012 10:19 AM Performed by: Carney Living Oxygen Delivery Method: Circle system utilized Intubation Type: Inhalational induction Tube size: 7.5 mm Placement Confirmation: positive ETCO2 and breath sounds checked- equal and bilateral Comments: Pt with existing ETT at 24 cm at teeth

## 2012-02-23 NOTE — Anesthesia Postprocedure Evaluation (Signed)
  Anesthesia Post-op Note  Patient: [name]  Procedure(s) Performed: Procedure(s) (LRB) with comments: EXPLORATORY LAPAROTOMY (N/A) - Irrigation and Debridement of abdominal wound with wound vac change with partial closure IRRIGATION AND DEBRIDEMENT WOUND (N/A) ABDOMINAL VACUUM ASSISTED CLOSURE CHANGE (N/A)  Patient Location: SICU  Anesthesia Type: General  Level of Consciousness: Patient remains intubated per anesthesia plan  Airway and Oxygen Therapy: Patient remains intubated per anesthesia plan and Patient placed on Ventilator (see vital sign flow sheet for setting)  Post-op Pain: none  Post-op Assessment: Post-op Vital signs reviewed, Patient's Cardiovascular Status Stable, Respiratory Function Stable, Patent Airway, No signs of Nausea or vomiting and Pain level controlled  Post-op Vital Signs: stable  Complications: No apparent anesthesia complications

## 2012-02-23 NOTE — H&P (Signed)
Patient examined and I agree with the assessment and plan  Georganna Skeans, MD, MPH, FACS Pager: [phone]  02/23/2012 7:50 PM

## 2012-02-24 ENCOUNTER — Inpatient Hospital Stay (HOSPITAL_COMMUNITY): Payer: Medicaid Other

## 2012-02-24 DIAGNOSIS — E43 Unspecified severe protein-calorie malnutrition: Secondary | ICD-10-CM

## 2012-02-24 DIAGNOSIS — E440 Moderate protein-calorie malnutrition: Secondary | ICD-10-CM | POA: Diagnosis present

## 2012-02-24 LAB — GLUCOSE, CAPILLARY
Glucose-Capillary: 186 mg/dL — ABNORMAL HIGH (ref 70–99)
Glucose-Capillary: 174 mg/dL — ABNORMAL HIGH (ref 70–99)
Glucose-Capillary: 165 mg/dL — ABNORMAL HIGH (ref 70–99)
Glucose-Capillary: 147 mg/dL — ABNORMAL HIGH (ref 70–99)
Glucose-Capillary: 134 mg/dL — ABNORMAL HIGH (ref 70–99)

## 2012-02-24 LAB — COMPREHENSIVE METABOLIC PANEL
ALT: 18 U/L (ref 0–53)
AST: 36 U/L (ref 0–37)
Albumin: 1.1 g/dL — ABNORMAL LOW (ref 3.5–5.2)
Alkaline Phosphatase: 77 U/L (ref 39–117)
BUN: 27 mg/dL — ABNORMAL HIGH (ref 6–23)
CO2: 21 mEq/L (ref 19–32)
Calcium: 7.4 mg/dL — ABNORMAL LOW (ref 8.4–10.5)
Chloride: 109 mEq/L (ref 96–112)
Creatinine, Ser: 1.18 mg/dL (ref 0.50–1.35)
GFR calc Af Amer: 76 mL/min — ABNORMAL LOW (ref >90)
GFR calc non Af Amer: 65 mL/min — ABNORMAL LOW (ref >90)
Glucose, Bld: 225 mg/dL — ABNORMAL HIGH (ref 70–99)
Potassium: 5 mEq/L (ref 3.5–5.1)
Sodium: 137 mEq/L (ref 135–145)
Total Bilirubin: 2 mg/dL — ABNORMAL HIGH (ref 0.3–1.2)
Total Protein: 5.3 g/dL — ABNORMAL LOW (ref 6.0–8.3)

## 2012-02-24 LAB — CBC
HCT: 25.9 % — ABNORMAL LOW (ref 39.0–52.0)
Hemoglobin: 8.9 g/dL — ABNORMAL LOW (ref 13.0–17.0)
MCH: 25.1 pg — ABNORMAL LOW (ref 26.0–34.0)
MCHC: 34.4 g/dL (ref 30.0–36.0)
MCV: 73 fL — ABNORMAL LOW (ref 78.0–100.0)
Platelets: 324 10*3/uL (ref 150–400)
RBC: 3.55 MIL/uL — ABNORMAL LOW (ref 4.22–5.81)
RDW: 20.5 % — ABNORMAL HIGH (ref 11.5–15.5)
WBC: 13.7 10*3/uL — ABNORMAL HIGH (ref 4.0–10.5)

## 2012-02-24 LAB — VANCOMYCIN, TROUGH: Vancomycin Tr: 14.6 ug/mL (ref 10.0–20.0)

## 2012-02-24 LAB — TROPONIN I
Troponin I: <0.3 ng/mL (ref <0.30)
Troponin I: <0.3 ng/mL (ref <0.30)

## 2012-02-24 LAB — DIFFERENTIAL
Basophils Absolute: 0 10*3/uL (ref 0.0–0.1)
Basophils Relative: 0 % (ref 0–1)
Eosinophils Absolute: 0.3 10*3/uL (ref 0.0–0.7)
Eosinophils Relative: 2 % (ref 0–5)
Lymphocytes Relative: 16 % (ref 12–46)
Lymphs Abs: 2.2 10*3/uL (ref 0.7–4.0)
Monocytes Absolute: 1 10*3/uL (ref 0.1–1.0)
Monocytes Relative: 7 % (ref 3–12)
Neutro Abs: 10.2 10*3/uL — ABNORMAL HIGH (ref 1.7–7.7)
Neutrophils Relative %: 75 % (ref 43–77)

## 2012-02-24 LAB — MAGNESIUM: Magnesium: 1.8 mg/dL (ref 1.5–2.5)

## 2012-02-24 LAB — TRIGLYCERIDES: Triglycerides: 198 mg/dL — ABNORMAL HIGH (ref <150)

## 2012-02-24 LAB — PHOSPHORUS: Phosphorus: 3.6 mg/dL (ref 2.3–4.6)

## 2012-02-24 LAB — PREALBUMIN: Prealbumin: 4 mg/dL — ABNORMAL LOW (ref 17.0–34.0)

## 2012-02-24 LAB — CHOLESTEROL, TOTAL: Cholesterol: 81 mg/dL (ref 0–200)

## 2012-02-24 IMAGING — CR DG CHEST 1V PORT
1 series · 1 of 1 positions shown · non-contrast
Comparison: [DATE]

CLINICAL DATA: Atelectasis follow-up.

PORTABLE CHEST - 1 VIEW

[AP]
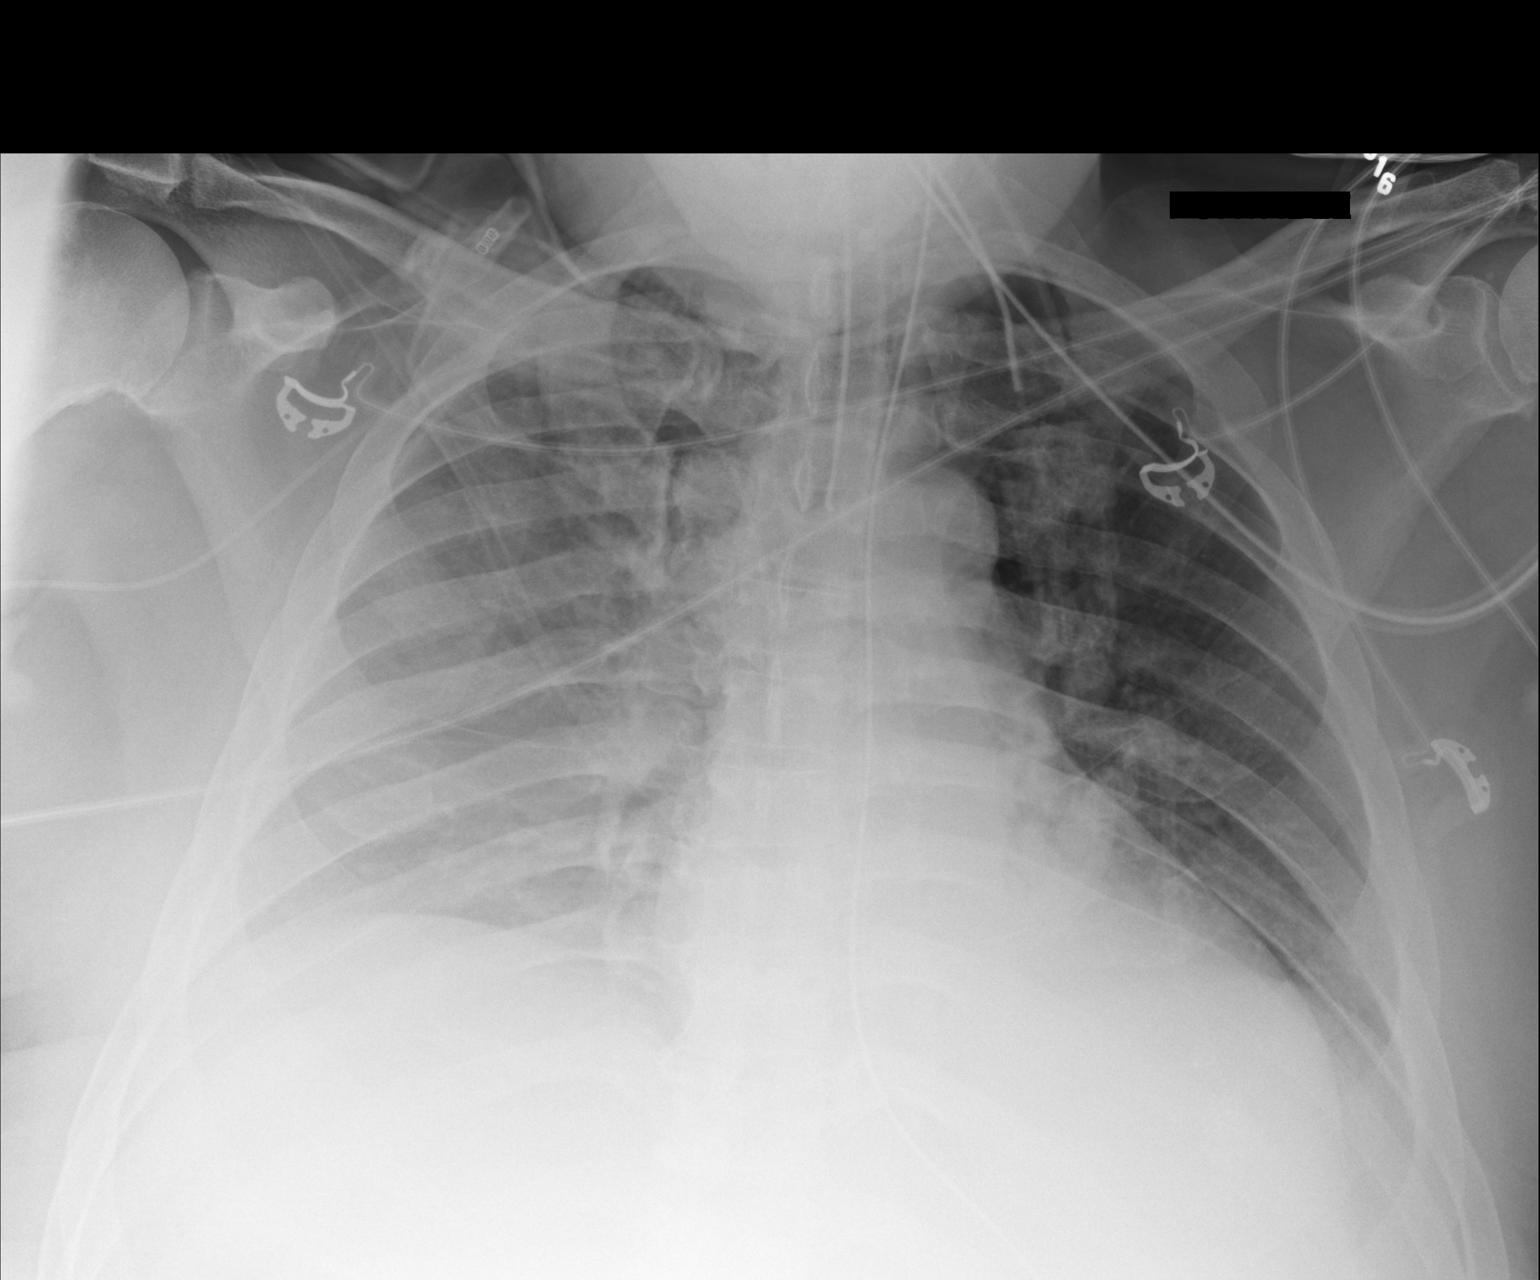

[1 of 1 positions shown; findings below may reference images not displayed]

FINDINGS: Endotracheal tube tip is similar in position above the
carina.  NG tube descends below the image.  Heart size upper normal
to mildly enlarged.  Central vascular congestion.  Mild perihilar
opacity and layering pleural effusions with associated compressive
atelectasis or infiltrate.  No pneumothorax.  No acute osseous
finding.
IMPRESSION: Heart size upper normal with central vascular congestion. Mild
edema suggested.

Right greater than left pleural effusions with associated
consolidations, favor compressive atelectasis.

## 2012-02-24 MED ORDER — ZINC TRACE METAL 1 MG/ML IV SOLN
INTRAVENOUS | Status: AC
  Administered 2012-02-24: 18:00:00 via INTRAVENOUS
  Filled 2012-02-24: qty 2000

## 2012-02-24 MED ORDER — FAT EMULSION 20 % IV EMUL
240.0000 mL | INTRAVENOUS | Status: AC
  Administered 2012-02-24: 240 mL via INTRAVENOUS
  Filled 2012-02-24: qty 250

## 2012-02-24 MED ORDER — MAGNESIUM SULFATE IN D5W 10-5 MG/ML-% IV SOLN
1.0000 g | Freq: Once | INTRAVENOUS | Status: AC
  Administered 2012-02-24: 1 g via INTRAVENOUS
  Filled 2012-02-24: qty 100

## 2012-02-24 NOTE — Progress Notes (Signed)
PARENTERAL NUTRITION CONSULT NOTE - Follow up  Pharmacy Consult for TPN Indication: Prolonged ileus  No Known Allergies  Patient Measurements: Height: 5\' 11"  (180.3 cm) Weight: 235 lb 3.7 oz (106.7 kg) IBW/kg (Calculated) : 75.3  Ideal Body Weight: 80 Usual Weight: 117kg  Vital Signs: Temp: 98.1 F (36.7 C) (09/23 0731) Temp src: Oral (09/23 0731) BP: 92/67 mmHg (09/23 0700) Pulse Rate: 54  (09/23 0700) Intake/Output from previous day: 09/22 0701 - 09/23 0700 In: 3211.4 [I.V.:1355.6; NG/GT:30; IV Piggyback:1785; TPN:40.8] Out: 3055 [Urine:2305; Emesis/NG output:400; Drains:350] Intake/Output from this shift:    Labs:  Basename 02/24/12 0225 02/23/12 0400 02/22/12 0418  WBC 13.7* 14.0* 14.3*  HGB 8.9* 9.3* 9.9*  HCT 25.9* 26.5* 28.8*  PLT 324 294 286  APTT -- -- --  INR -- -- --     Basename 02/24/12 0225 02/23/12 0400 02/22/12 1000 02/22/12 0418  NA 137 138 -- 141  K 5.0 4.9 -- 5.2*  CL 109 109 -- 112  CO2 21 22 -- 21  GLUCOSE 225* 116* -- 153*  BUN 27* 26* -- 29*  CREATININE 1.18 1.23 -- 1.35  LABCREA -- -- -- --  CREAT24HRUR -- -- -- --  CALCIUM 7.4* 7.7* -- 7.4*  MG 1.8 1.9 -- --  PHOS 3.6 3.5 -- 3.5  PROT 5.3* 5.6* -- --  ALBUMIN 1.1* 1.3* -- --  AST 36 36 -- --  ALT 18 18 -- --  ALKPHOS 77 87 -- --  BILITOT 2.0* 2.1* -- --  BILIDIR -- -- -- --  IBILI -- -- -- --  PREALBUMIN -- -- 3.8* --  TRIG 198* 209* -- --  CHOLHDL -- -- -- --  CHOL 81 -- -- --   Estimated Creatinine Clearance: 82.8 ml/min (by C-G formula based on Cr of 1.18).    Basename 02/24/12 0733 02/24/12 0412 02/23/12 2350  GLUCAP 174* 186* 132*     Insulin Requirements in the past 24 hours:  14 units SSI, 10 units in TPN  Current Nutrition:  Clinimix 5/15 at 66mL/hr (no electrolyte formula due to elevated K)  Nutritional Goals:  2190 kCal, 140-150 grams of protein per day per RD  Clinimix 5/15 at 110 cc/hr will provide 132 gm protein/d (94% of needs) and 2088 avg kcal/d  (95% of needs)   Assessment: 61 year old man underwent hemicolectomy on 9/5 2/2 stage II colon CA and was discharged on 9/12.  He was readmitted 9/14 and underwent surgery for anastomatic leak and ileostomy on 9/15.  TPN initiated for prolonged ileus and prolonged fasting.   GI:  OR 9/22 for VAC change, edema precluded full closure of wound, plan closure this Wed 9/25. Neg pressure drain 32mL output/24. 400 mL bile output/24 from NGT Endo:  Hx IDDM. CBGs 2/3 > 150. On resistant SSI Lytes:  Na 137, K 5, Phos 3.6, Mag 1.8, Corr Ca 9.7.  Renal:  SCr 1.18 (down), CrCl ~ 80 ml/min.  UOP 0.9 mL/kg/hr (Lasix 20mg  IV x 1 yesterday). [facility] at [facility] ID:  Afeb, WBC 13.7 (stable). Vancomycin, fluconazole and Zosyn on board for peritonitis 2/2 anastomotic leak.  Has been on antibiotics since 9/15. Heme/Onc:  H/H 8.9/25.9 (slight down).  Plts 294 (stable). SCD for VTE px. Stage II adenocarcinoma of the colon Pulm:  Hx OSA. On Vent, FiO2 30% Cards:  Hx HTN, HLD. Hypotensive, off pressors. MAP 59-73, P 55. CE (-) x3. Sinus brady on EKG, QTc 403 msec Hepatobil:  LFTs/Alk Phos wnl.  Alb extremely low at 1.1 in the setting of ongoing inflammation with SIRS/sepsis/trips to OR. TG high at 198 Neuro:  Precedex @1 .2/kg/hr, [id number] 400/hr, Versed prn, pt has had periods of agitation since admit. GCS 13, RASS -1 (at goal)  Best Practices: SCDs, MC, PPI TPN Access: PICC triple lumen placed 9/22 TPN day#: 2  Plan:  - Increase Clinimix 5/15 to 8mL/hr (no electrolyte formula due to elevated K). Formula will provide 96 g protein and 1577 Kcal. Meets 72% minimum estimated kcal and 68% minimum estimated protein needs.  - Increase insulin in TPN bag to 20 units with new TPN rate due to his history of type 2 diabetes and CBGs above goal - Continue SSI q4h - Multivitamins, trace elements, and lipids will be added on MWF only due to national shortage. - Magnesium sulfate 1gm IV x 1 today - CMET in AM to f/u K, SCr - Mag in  AM  Thank-you for this consult.  Johny Drilling, PharmD Clinical Pharmacist Pager: [phone] Pharmacy: [phone] 02/24/2012 12:52 PM

## 2012-02-24 NOTE — Progress Notes (Addendum)
Name: [name] MRN: [id number] DOB: 05/02/1951    LOS: 8 days Date of admit: 02/16/2012 PCP is [name], MD   Referring Provider:  [name] of [facility] Reason for Referral:  Post op resp failure, metabolic acidosis and hypotension   PULMONARY / CRITICAL CARE MEDICINE  HPI: 61 yo male smoker s/p colonoscopy and found to have 3-4 cm sessile mass in distal transverse colon.  Had laparoscopic Rt hemicolectomy 9/05, and d/c home 9/12.  He developed N/V and abd pain, and found to have pneumoperitoneum 9/15 from anastomotic leak.  He had ileocolonic resection, ileostomy, and wound vac.  He remained on vent post-op, and PCCM consulted.  Events Since Admission: 02/16/2012 >>Laparotomy  SUBJECTIVE: Remains on vent, sedated.  Intermittently agitated.    Vital Signs: Temp:  [97.7 F (36.5 C)-98.1 F (36.7 C)] 98.1 F (36.7 C) (09/23 0731) Pulse Rate:  [51-66] 54  (09/23 0810) Resp:  [4-21] 12  (09/23 0810) BP: (80-130)/(33-71) 100/52 mmHg (09/23 0810) SpO2:  [98 %-100 %] 100 % (09/23 0810) FiO2 (%):  [29.8 %-30.4 %] 29.9 % (09/23 0810) Weight:  [106.7 kg (235 lb 3.7 oz)] 106.7 kg (235 lb 3.7 oz) (09/23 0400)  Physical Examination: General - intermittent agitation HEENT - ett in place Cardiac - s1s2 regular, no murmur Chest - no wheeze Abd - soft, mildly distended, ileostomy and wound vac in place Ext - no edema Neuro - sedated    ASSESSMENT AND PLAN Principal Problem:  *Peritonitis Active Problems:  Type II diabetes mellitus, well controlled  OBSTRUCTIVE SLEEP APNEA  Obesity (BMI 30.0-34.9)  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  ARF (acute renal failure)  Acute respiratory failure with hypoxia  Acidosis  Hypotension  Severe sepsis  Protein-calorie malnutrition, severe    PULMONARY  Lab 02/20/12 1209 02/19/12 0416 02/18/12 0411  PHART 7.418 7.382 7.310*  PCO2ART 31.7* 31.8* 30.5*  PO2ART 107.0* 159.0* 126.0*  HCO3 20.5 18.8* 15.4*    O2SAT 98.0 99.0 99.0   Ventilator Settings: Vent Mode:  [-] PRVC FiO2 (%):  [29.8 %-30.4 %] 29.9 % Set Rate:  [12 bmp] 12 bmp Vt Set:  [600 mL] 600 mL PEEP:  [5 cmH20] 5 cmH20 Plateau Pressure:  [17 cmH20-19 cmH20] 17 cmH20  ETT 02/16/2012 >> 02/20/12 (self extubated post OR)  ETT 02/20/12 >>  A:  Acute respiratory failure in the setting of peritonitis. Hx of OSA. P:   Continue vent support until abdominal issues stable F/u CXR Titrate oxygen to keep SpO2 > 92%  CARDIOVASCULAR  Lab 02/24/12 0225 02/23/12 2040 02/21/12 0839 02/19/12 0930  TROPONINI <0.30 <0.30 -- --  LATICACIDVEN -- -- -- 1.3  PROBNP -- -- 295.9* --  9/23 CVP 20.  BP soft  Rt IJ CVL 9/15>> A line 02/16/2012 >>02/19/12  A: Septic shock from peritonitis>>resolved. Hx of HTN. P:  Hold anti-HTN meds for now No lasix 9/23 with soft BP  RENAL  Lab 02/24/12 0225 02/23/12 0400 02/22/12 0418 02/21/12 0400 02/20/12 0445 02/19/12 0406  NA 137 138 141 139 143 --  K 5.0 4.9 -- -- -- --  CL 109 109 112 110 113* --  CO2 21 22 21 21 23  --  BUN 27* 26* 29* 25* 30* --  CREATININE 1.18 1.23 1.35 1.28 1.33 --  CALCIUM 7.4* 7.7* 7.4* 7.2* 7.2* --  MG 1.8 1.9 -- 2.1 2.3 2.1  PHOS 3.6 3.5 3.5 3.1 2.6 --   Intake/Output      09/22 0701 -  09/23 0700 09/23 0701 - 09/24 0700   I.V. (mL/kg) 1371.8 (12.9) 942.5 (8.8)   NG/GT 30    IV Piggyback 1785 12.5   TPN 40.8 650   Total Intake(mL/kg) 3227.7 (30.3) 1605 (15)   Urine (mL/kg/hr) 2305 (0.9) 275   Emesis/NG output 400    Drains 350 50   Total Output 3055 325   Net +172.7 +1280         Foley: 02/16/2012   A: Acute renal failure from shock>>improving.  Metabolic acidosis>>resolved.   P Monitor BMET daily   GASTROINTESTINAL  Lab 02/24/12 0225 02/23/12 0400  AST 36 36  ALT 18 18  ALKPHOS 77 87  BILITOT 2.0* 2.1*  PROT 5.3* 5.6*  ALBUMIN 1.1* 1.3*    A: Acute peritonitis 2nd to anastomotic leak.   Unable to close abdomen 9/22  ?return to OR soon?   Nutrition for severe protein calorie malnutrition  P:   Return to OR per [facility] Continue TPN  HEMATOLOGIC  Lab 02/24/12 0225 02/23/12 0400 02/22/12 0418 02/21/12 0400 02/20/12 0445  HGB 8.9* 9.3* 9.9* 10.2* 9.8*  HCT 25.9* 26.5* 28.8* 29.0* 27.6*  PLT 324 294 286 270 322  INR -- -- -- -- --  APTT -- -- -- -- --   A: Anemia of critical illness. P:  F/u CBC Transfuse for Hb < 7  INFECTIOUS  Lab 02/24/12 0225 02/23/12 0400 02/22/12 0418 02/21/12 0400 02/20/12 0445 02/19/12 0930 02/19/12 0406  WBC 13.7* 14.0* 14.3* 13.8* 13.0* -- --  PROCALCITON -- -- 2.55 -- -- 8.20 9.73   Cultures: Urine 9/15>>negative Blood 9/18>>neg Urine 9/18>>negative Sputum 9/18>>Candida  Antibiotics: Rocephin 9/15>>9/15 Ertapenem 9/15>>9/17 Zosyn (abd leak, peritonitis) 9/18>> Vancomycin (abd leak, peritonitis)9/18>> Diflucan (peritonitis, abd leak) 9/20>>  A: Septic shock from peritonitis.  Procalcitonin trending down.  Recurrent fever 9/18 and 9/20>>improved with change in Abx and adding anti-fungal. P:   Abx as above  ENDOCRINE  Lab 02/24/12 0733 02/24/12 0412 02/23/12 2350 02/23/12 1930 02/23/12 1559  GLUCAP 174* 186* 132* 79 136*   A: DM type II Ok control P:   SSI  NEUROLOGIC  A: Sedation.  P:   Continue precedex and fentanyl gtt Versed, haldol prn  BEST PRACTICE / DISPOSITION Level of Care:  ICU Primary Service:  [facility] Consultants:  PCCM Code Status:  Full Diet:  TPN DVT Px:  SCD GI Px:  Protonix  Critical care time 35 minutes.  Mariel Sleet Pump Back  [phone]  Cell  [phone]  If no response or cell goes to voicemail, call beeper [phone]  02/24/2012, 9:07 AM

## 2012-02-24 NOTE — Progress Notes (Addendum)
ANTIBIOTIC CONSULT NOTE - FOLLOW UP  Pharmacy Consult for Vancomycin, Zosyn, Fluconazole Indication: suspected HCAP, peritonitis/abdominal sepsis  No Known Allergies  Patient Measurements: Height: 5\' 11"  (180.3 cm) Weight: 235 lb 3.7 oz (106.7 kg) IBW/kg (Calculated) : 75.3   Vital Signs: Temp: 98.1 F (36.7 C) (09/23 0731) Temp src: Oral (09/23 0731) BP: 92/48 mmHg (09/23 1000) Pulse Rate: 54  (09/23 0810) Intake/Output from previous day: 09/22 0701 - 09/23 0700 In: 3227.7 [I.V.:1371.8; NG/GT:30; IV Piggyback:1785; TPN:40.8] Out: 3055 [Urine:2305; Emesis/NG output:400; Drains:350] Intake/Output from this shift: Total I/O In: 2082.5 [I.V.:1107.5; IV Piggyback:225; TPN:750] Out: 400 [Urine:350; Drains:50]  Labs:  Basename 02/24/12 0225 02/23/12 0400 02/22/12 0418  WBC 13.7* 14.0* 14.3*  HGB 8.9* 9.3* 9.9*  PLT 324 294 286  LABCREA -- -- --  CREATININE 1.18 1.23 1.35   Estimated Creatinine Clearance: 82.8 ml/min (by C-G formula based on Cr of 1.18). No results found for this basename: VANCOTROUGH:2,VANCOPEAK:2,VANCORANDOM:2,GENTTROUGH:2,GENTPEAK:2,GENTRANDOM:2,TOBRATROUGH:2,TOBRAPEAK:2,TOBRARND:2,AMIKACINPEAK:2,AMIKACINTROU:2,AMIKACIN:2, in the last 72 hours   Assessment: 60yom on Vancomycin and Zosyn Day 6 (9 days total antibiotics with Ertapenem 9/15-9/17) for suspected HCAP/peritonitis and added fluconazole for possible abdominal sepsis. WBC trending down and no significant findings reported on cultures. ARF has resolved and SCr continues to decrease (2.4-->1.18, CrCl 80) vancomycin trough is 14.6, just slightly below goal (15-20 mcg/ml)  Goal of Therapy:  Vancomycin trough level 15-20 mcg/ml  Plan:  1. Continue Vancomycin 1g IV q12h, consider increase dose to 1250mg  Q12hrs if renal fx continue improving. 2. Fluconazole 400mg  IV q24h 3. Continue Zosyn 3.375g IV q8h - infuse over 4 hours 4. Monitor renal function, cultures and clincal progression  Maryanna Shape, PharmD,  BCPS  Clinical Pharmacist  Pager: [phone]  9/24 Pt. Scr continue decreasing, today is 0.97, est. crcl > 16ml/min Will increase vancomycin to 1250 mg IV Q 12hrs to keep trough level in therapeutic range. Will f/u length of treatment and renal function

## 2012-02-24 NOTE — Progress Notes (Signed)
eLink Physician-Brief Progress Note Patient Name: [name] DOB: [birth year] MRN: [id number]  Date of Service  02/24/2012   HPI/Events of Note   Harm to self  eICU Interventions  restraint   Intervention Category Minor Interventions: Routine modifications to care plan (e.g. PRN medications for pain, fever)  Raylene Miyamoto. 02/24/2012, 8:14 PM

## 2012-02-24 NOTE — Progress Notes (Signed)
1 Day Post-Op  Subjective: Patient remains intubated, sedated with Precedex Top and bottom ends of fascia partially closed yesterday.   Objective: Vital signs in last 24 hours: Temp:  [97.7 F (36.5 C)-98.1 F (36.7 C)] 98.1 F (36.7 C) (09/23 0731) Pulse Rate:  [51-66] 54  (09/23 0810) Resp:  [4-21] 12  (09/23 0810) BP: (80-130)/(33-71) 100/52 mmHg (09/23 0810) SpO2:  [98 %-100 %] 100 % (09/23 0810) FiO2 (%):  [29.8 %-30.4 %] 29.9 % (09/23 0810) Weight:  [235 lb 3.7 oz (106.7 kg)] 235 lb 3.7 oz (106.7 kg) (09/23 0400) Last BM Date: 02/21/12 (ileostomy)  Intake/Output from previous day: 09/22 0701 - 09/23 0700 In: 3227.7 [I.V.:1371.8; NG/GT:30; IV Piggyback:1785; TPN:40.8] Out: 3055 [Urine:2305; Emesis/NG output:400; Drains:350] Intake/Output this shift: Total I/O In: [id number] [I.V.:942.5; IV Piggyback:12.5; TPN:650] Out: 325 [Urine:275; Drains:50]  General appearance: intubated, sedated Resp: clear to auscultation bilaterally GI: distended; ostomy pink, serous output; VAC - good seal over blue sponge; serous output  Lab Results:   Basename 02/24/12 0225 02/23/12 0400  WBC 13.7* 14.0*  HGB 8.9* 9.3*  HCT 25.9* 26.5*  PLT 324 294   BMET  Basename 02/24/12 0225 02/23/12 0400  NA 137 138  K 5.0 4.9  CL 109 109  CO2 21 22  GLUCOSE 225* 116*  BUN 27* 26*  CREATININE 1.18 1.23  CALCIUM 7.4* 7.7*   PT/INR No results found for this basename: LABPROT:2,INR:2 in the last 72 hours ABG No results found for this basename: PHART:2,PCO2:2,PO2:2,HCO3:2 in the last 72 hours  Studies/Results: Dg Chest Port 1 View  02/24/2012  *RADIOLOGY REPORT*  Clinical Data: Atelectasis follow-up.  PORTABLE CHEST - 1 VIEW  Comparison: 02/23/2012  Findings: Endotracheal tube tip is similar in position above the carina.  NG tube descends below the image.  Heart size upper normal to mildly enlarged.  Central vascular congestion.  Mild perihilar opacity and layering pleural effusions with  associated compressive atelectasis or infiltrate.  No pneumothorax.  No acute osseous finding.  IMPRESSION: Heart size upper normal with central vascular congestion. Mild edema suggested.  Right greater than left pleural effusions with associated consolidations, favor compressive atelectasis.   Original Report Authenticated By: Suanne Marker, M.D.    Dg Chest Port 1 View  02/23/2012  *RADIOLOGY REPORT*  Clinical Data: Follow-up evaluation for pleural effusions.  PORTABLE CHEST - 1 VIEW  Comparison: Chest x-ray 02/22/2012.  Findings: An endotracheal tube is in place with tip 4.1 cm above the carina. A nasogastric tube is seen extending into the stomach, however, the tip of the nasogastric tube extends below the lower margin of the image.  Lung volumes are low.  No consolidative airspace disease.  Slight hazy opacity projecting over the lower right hemithorax may represent a small posterior layering right- sided pleural effusion.  No left pleural effusion.  No pneumothorax.  Left lower lobe subsegmental atelectasis.  Pulmonary vasculature and cardiomediastinal silhouette are within normal limits.  IMPRESSION: 1.  Support apparatus, as above. 2.  Possible trace right sided pleural effusion layering posteriorly. 3.  Minimal left lower lobe subsegmental atelectasis.   Original Report Authenticated By: Etheleen Mayhew, M.D.     Anti-infectives: Anti-infectives     Start     Dose/Rate Route Frequency Ordered Stop   02/22/12 0000   vancomycin (VANCOCIN) IVPB 1000 mg/200 mL premix        1,000 mg 200 mL/hr over 60 Minutes Intravenous Every 12 hours 02/21/12 0929     02/21/12 1000  fluconazole (DIFLUCAN) IVPB 400 mg        400 mg 200 mL/hr over 60 Minutes Intravenous Every 24 hours 02/21/12 0929     02/20/12 1000   vancomycin (VANCOCIN) 1,500 mg in sodium chloride 0.9 % 500 mL IVPB        1,500 mg 250 mL/hr over 120 Minutes Intravenous Every 24 hours 02/19/12 0942 02/21/12 1246   02/19/12 1000    piperacillin-tazobactam (ZOSYN) IVPB 3.375 g        3.375 g 12.5 mL/hr over 240 Minutes Intravenous 3 times per day 02/19/12 0942     02/19/12 1000   vancomycin (VANCOCIN) 2,000 mg in sodium chloride 0.9 % 500 mL IVPB        2,000 mg 250 mL/hr over 120 Minutes Intravenous  Once 02/19/12 0942 02/19/12 1335   02/16/12 1615   ertapenem (INVANZ) 1 g in sodium chloride 0.9 % 50 mL IVPB        1 g 100 mL/hr over 30 Minutes Intravenous To Surgery 02/16/12 1606 02/16/12 1733   02/16/12 1600   ertapenem (INVANZ) 1 g in sodium chloride 0.9 % 50 mL IVPB  Status:  Discontinued        1 g 100 mL/hr over 30 Minutes Intravenous Every 24 hours 02/16/12 1508 02/19/12 0909   02/16/12 1415   cefTRIAXone (ROCEPHIN) 1 g in dextrose 5 % 50 mL IVPB        1 g 100 mL/hr over 30 Minutes Intravenous  Once 02/16/12 1407 02/16/12 1448          Assessment/Plan: s/p Procedure(s) (LRB) with comments: EXPLORATORY LAPAROTOMY (N/A) - Irrigation and Debridement of abdominal wound with wound vac change with partial closure IRRIGATION AND DEBRIDEMENT WOUND (N/A) ABDOMINAL VACUUM ASSISTED CLOSURE CHANGE (N/A) Vent/ abx management per CCM;  appreciate their input On TNA; Plan return to OR on Wednesday to hopefully finish closing the fascia. Discussed with Dr. Joya Gaskins - CCM  LOS: 8 days    Alvaro Aungst K. 02/24/2012

## 2012-02-25 ENCOUNTER — Inpatient Hospital Stay (HOSPITAL_COMMUNITY): Payer: Medicaid Other

## 2012-02-25 ENCOUNTER — Encounter (HOSPITAL_COMMUNITY): Payer: Self-pay | Admitting: Surgery

## 2012-02-25 ENCOUNTER — Encounter (INDEPENDENT_AMBULATORY_CARE_PROVIDER_SITE_OTHER): Payer: Self-pay

## 2012-02-25 LAB — CULTURE, BLOOD (ROUTINE X 2)
Culture: NO GROWTH
Culture: NO GROWTH

## 2012-02-25 LAB — COMPREHENSIVE METABOLIC PANEL
ALT: 25 U/L (ref 0–53)
AST: 36 U/L (ref 0–37)
Albumin: 1.2 g/dL — ABNORMAL LOW (ref 3.5–5.2)
Alkaline Phosphatase: 97 U/L (ref 39–117)
BUN: 20 mg/dL (ref 6–23)
CO2: 21 mEq/L (ref 19–32)
Calcium: 7.2 mg/dL — ABNORMAL LOW (ref 8.4–10.5)
Chloride: 111 mEq/L (ref 96–112)
Creatinine, Ser: 0.97 mg/dL (ref 0.50–1.35)
GFR calc Af Amer: >90 mL/min (ref >90)
GFR calc non Af Amer: 88 mL/min — ABNORMAL LOW (ref >90)
Glucose, Bld: 247 mg/dL — ABNORMAL HIGH (ref 70–99)
Potassium: 4.5 mEq/L (ref 3.5–5.1)
Sodium: 138 mEq/L (ref 135–145)
Total Bilirubin: 1.3 mg/dL — ABNORMAL HIGH (ref 0.3–1.2)
Total Protein: 5.5 g/dL — ABNORMAL LOW (ref 6.0–8.3)

## 2012-02-25 LAB — GLUCOSE, CAPILLARY
Glucose-Capillary: 153 mg/dL — ABNORMAL HIGH (ref 70–99)
Glucose-Capillary: 160 mg/dL — ABNORMAL HIGH (ref 70–99)
Glucose-Capillary: 164 mg/dL — ABNORMAL HIGH (ref 70–99)
Glucose-Capillary: 180 mg/dL — ABNORMAL HIGH (ref 70–99)
Glucose-Capillary: 186 mg/dL — ABNORMAL HIGH (ref 70–99)
Glucose-Capillary: 218 mg/dL — ABNORMAL HIGH (ref 70–99)

## 2012-02-25 LAB — CBC
HCT: 23.1 % — ABNORMAL LOW (ref 39.0–52.0)
Hemoglobin: 7.7 g/dL — ABNORMAL LOW (ref 13.0–17.0)
MCH: 25.2 pg — ABNORMAL LOW (ref 26.0–34.0)
MCHC: 33.3 g/dL (ref 30.0–36.0)
MCV: 75.7 fL — ABNORMAL LOW (ref 78.0–100.0)
Platelets: 273 10*3/uL (ref 150–400)
RBC: 3.05 MIL/uL — ABNORMAL LOW (ref 4.22–5.81)
RDW: 22.1 % — ABNORMAL HIGH (ref 11.5–15.5)
WBC: 8.8 10*3/uL (ref 4.0–10.5)

## 2012-02-25 LAB — MAGNESIUM: Magnesium: 1.7 mg/dL (ref 1.5–2.5)

## 2012-02-25 IMAGING — CR DG CHEST 1V PORT
1 series · 1 of 1 positions shown · non-contrast
Comparison: [DATE]; [DATE]; [DATE]

CLINICAL DATA: Evaluate endotracheal tube, atelectasis

PORTABLE CHEST - 1 VIEW

[AP]
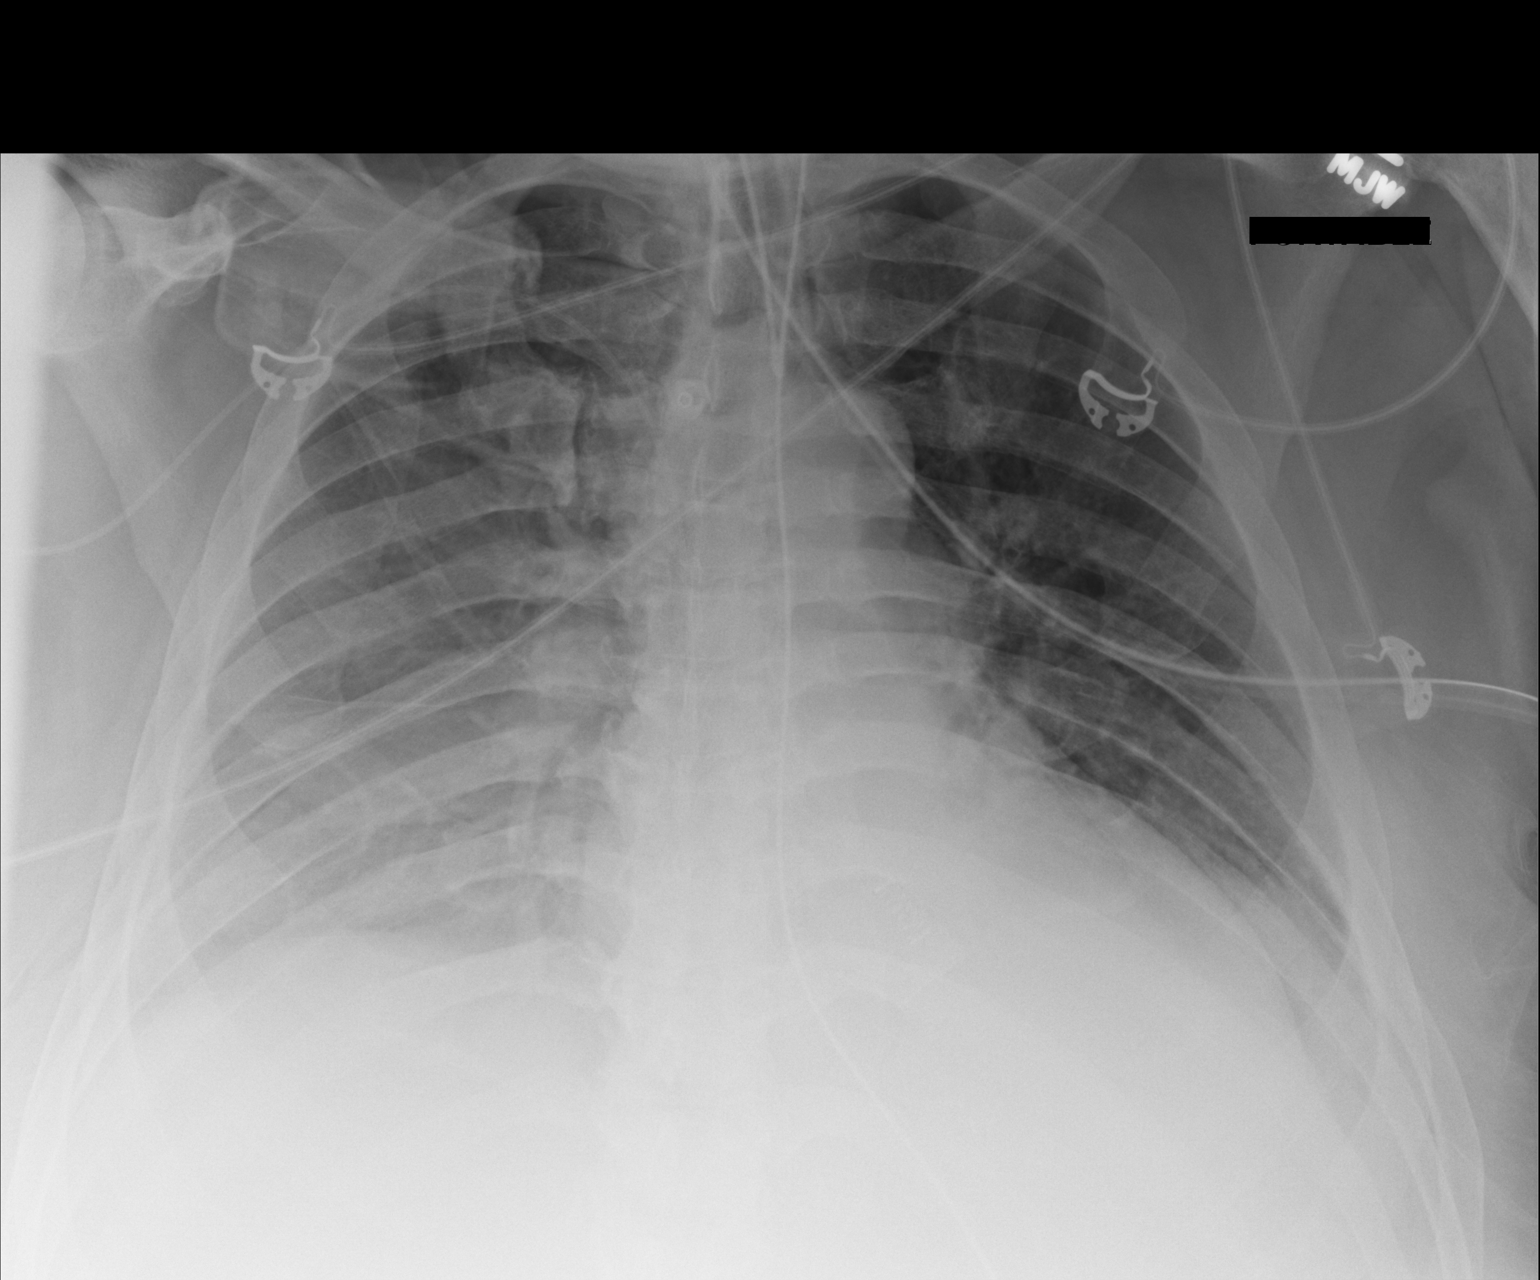

[1 of 1 positions shown; findings below may reference images not displayed]

FINDINGS: Grossly unchanged enlarged cardiac silhouette and
mediastinal contours.  Stable position of support apparatus.
Likely unchanged layering bilateral pleural effusions and bibasilar
heterogeneous opacities, left greater than right.  No new focal
airspace opacities.  Pulmonary vasculature remains indistinct.  No
definite pneumothorax.  Unchanged bones.
IMPRESSION: 1.  Stable positioning of support apparatus.  No pneumothorax.
2.  Grossly unchanged findings of mild edema small bilateral
effusions and bibasilar opacities, left greater than right,
possibly atelectasis.

## 2012-02-25 IMAGING — CR DG CHEST 1V PORT
1 series · 1 of 1 positions shown · non-contrast
Comparison: Earlier the same date and [DATE].

CLINICAL DATA: Endotracheal tube repositioning.

PORTABLE CHEST - 1 VIEW

[AP]
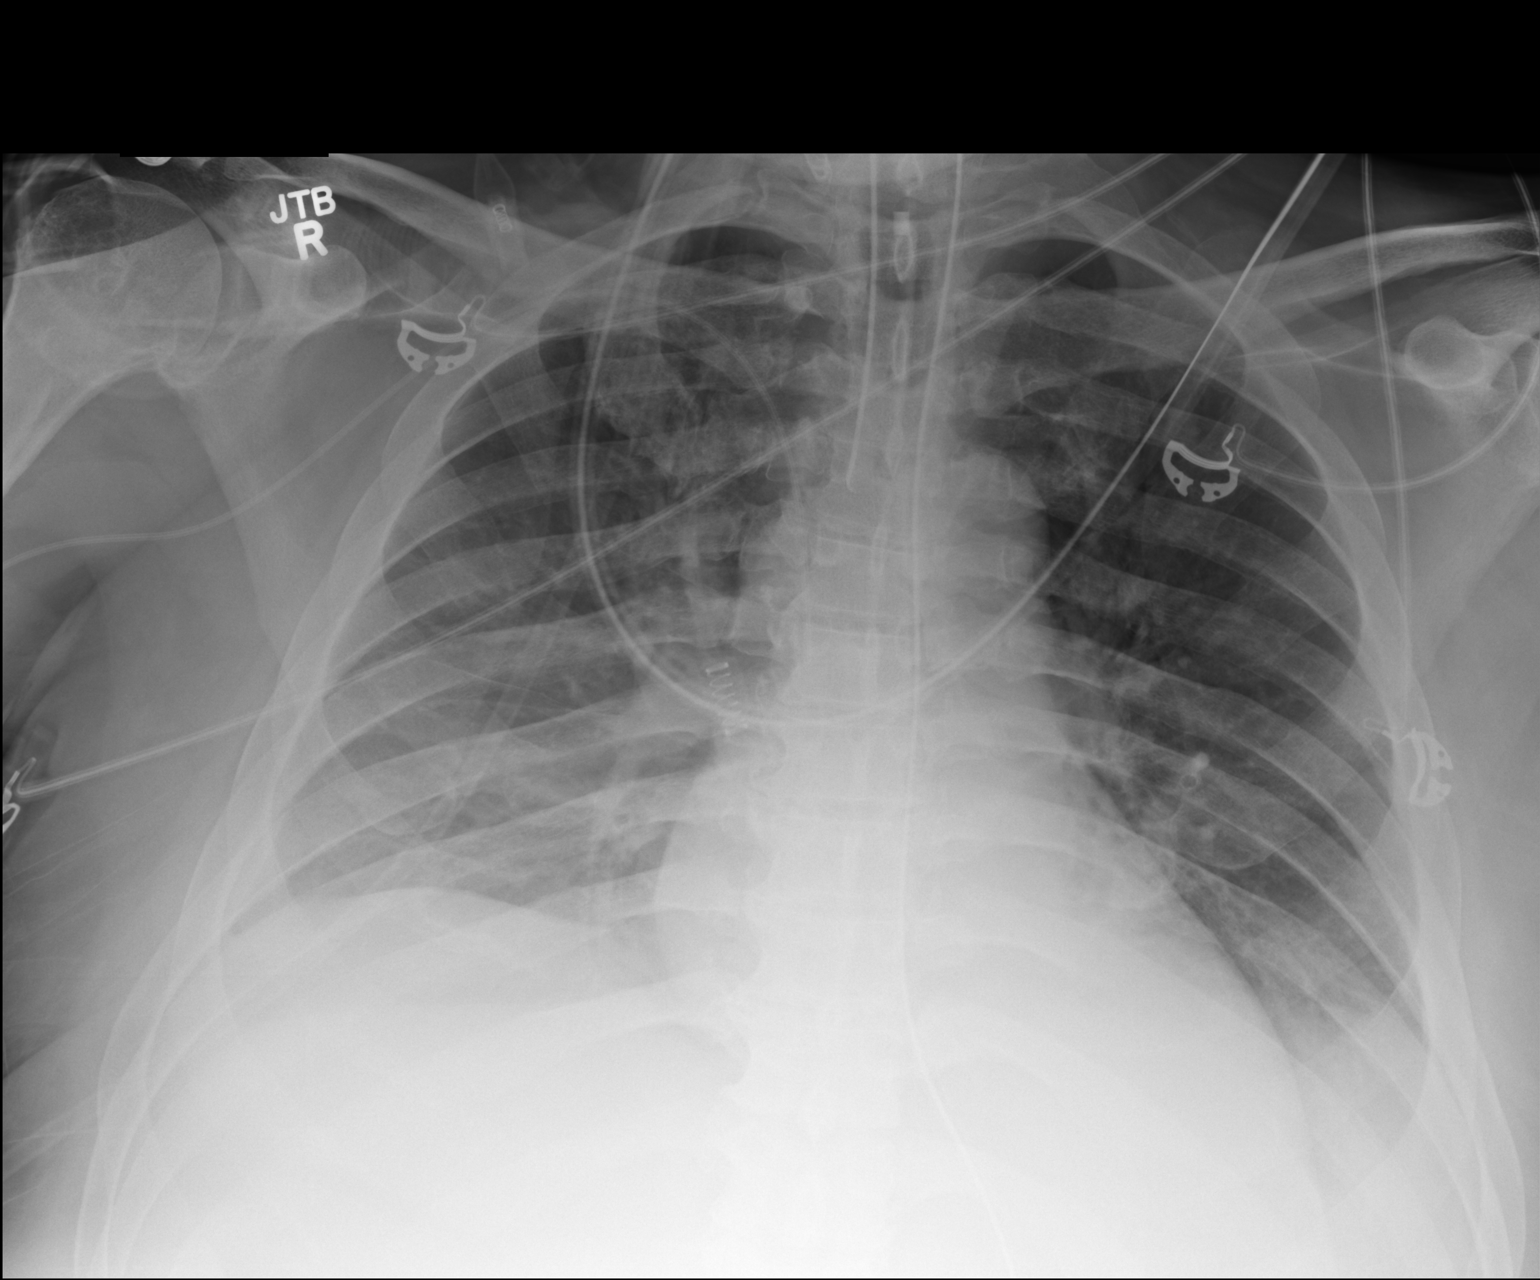

[1 of 1 positions shown; findings below may reference images not displayed]

FINDINGS: [94] hours.  The carina is not well defined.  The
endotracheal tube has been slightly advanced, now approximately
cm above the expected location of the carina.  Nasogastric tube and
right arm PICC are unchanged.  The heart size and mediastinal
contours are stable.  There are stable basilar opacities and
probable bilateral pleural effusions.  No pneumothorax is seen.
IMPRESSION: Slight advancement of the endotracheal tube within the mid trachea.
No significant change in basilar airspace opacities and pleural
effusions.

## 2012-02-25 MED ORDER — FENTANYL CITRATE 0.05 MG/ML IJ SOLN
200.0000 ug | Freq: Once | INTRAMUSCULAR | Status: AC
  Administered 2012-02-25: 200 ug via INTRAVENOUS

## 2012-02-25 MED ORDER — VANCOMYCIN HCL 1000 MG IV SOLR
1250.0000 mg | Freq: Two times a day (BID) | INTRAVENOUS | Status: DC
  Administered 2012-02-26 – 2012-02-29 (×6): 1250 mg via INTRAVENOUS
  Filled 2012-02-25 (×8): qty 1250

## 2012-02-25 MED ORDER — MIDAZOLAM HCL 5 MG/ML IJ SOLN
4.0000 mg | Freq: Once | INTRAMUSCULAR | Status: AC
  Administered 2012-02-25: 4 mg via INTRAVENOUS

## 2012-02-25 MED ORDER — INSULIN REGULAR HUMAN 100 UNIT/ML IJ SOLN
INTRAVENOUS | Status: AC
  Administered 2012-02-25: 18:00:00 via INTRAVENOUS
  Filled 2012-02-25: qty 2000

## 2012-02-25 NOTE — Progress Notes (Signed)
2 Days Post-Op  Subjective: Remains intubated, sedated on Precedex.  Currently not agitated.  Objective: Vital signs in last 24 hours: Temp:  [96.5 F (35.8 C)-98.2 F (36.8 C)] 96.5 F (35.8 C) (09/23 2009) Pulse Rate:  [49-99] 49  (09/24 0700) Resp:  [12-17] 12  (09/24 0700) BP: (83-128)/(47-74) 116/68 mmHg (09/24 0700) SpO2:  [89 %-100 %] 100 % (09/24 0700) FiO2 (%):  [29.8 %-30.4 %] 30.1 % (09/24 0700) Weight:  [243 lb 13.3 oz (110.6 kg)] 243 lb 13.3 oz (110.6 kg) (09/24 0600) Last BM Date: 02/21/12 (ileostomy)  Intake/Output from previous day: 09/23 0701 - 09/24 0700 In: 6276.7 [I.V.:3026.5; NG/GT:30; IV Piggyback:885; TPN:2335.2] Out: 2290 [Urine:1775; Emesis/NG output:300; Drains:165; Stool:50] Intake/Output this shift:    General appearance: Intubated, sedated Resp: clear to auscultation bilaterally Cardio: regular rate and rhythm, S1, S2 normal, no murmur, click, rub or gallop GI: Less distended; VAC with good seal; ostomy pink - minimal output No peripheral edema  Lab Results:   Basename 02/25/12 0341 02/24/12 0225  WBC 8.8 13.7*  HGB 7.7* 8.9*  HCT 23.1* 25.9*  PLT 273 324   BMET  Basename 02/25/12 0508 02/24/12 0225  NA 138 137  K 4.5 5.0  CL 111 109  CO2 21 21  GLUCOSE 247* 225*  BUN 20 27*  CREATININE 0.97 1.18  CALCIUM 7.2* 7.4*   PT/INR No results found for this basename: LABPROT:2,INR:2 in the last 72 hours ABG No results found for this basename: PHART:2,PCO2:2,PO2:2,HCO3:2 in the last 72 hours  Studies/Results: Dg Chest Port 1 View  02/24/2012  *RADIOLOGY REPORT*  Clinical Data: Atelectasis follow-up.  PORTABLE CHEST - 1 VIEW  Comparison: 02/23/2012  Findings: Endotracheal tube tip is similar in position above the carina.  NG tube descends below the image.  Heart size upper normal to mildly enlarged.  Central vascular congestion.  Mild perihilar opacity and layering pleural effusions with associated compressive atelectasis or infiltrate.  No  pneumothorax.  No acute osseous finding.  IMPRESSION: Heart size upper normal with central vascular congestion. Mild edema suggested.  Right greater than left pleural effusions with associated consolidations, favor compressive atelectasis.   Original Report Authenticated By: Suanne Marker, M.D.     Anti-infectives: Anti-infectives     Start     Dose/Rate Route Frequency Ordered Stop   02/22/12 0000   vancomycin (VANCOCIN) IVPB 1000 mg/200 mL premix        1,000 mg 200 mL/hr over 60 Minutes Intravenous Every 12 hours 02/21/12 0929     02/21/12 1000   fluconazole (DIFLUCAN) IVPB 400 mg        400 mg 200 mL/hr over 60 Minutes Intravenous Every 24 hours 02/21/12 0929     02/20/12 1000   vancomycin (VANCOCIN) 1,500 mg in sodium chloride 0.9 % 500 mL IVPB        1,500 mg 250 mL/hr over 120 Minutes Intravenous Every 24 hours 02/19/12 0942 02/21/12 1246   02/19/12 1000   piperacillin-tazobactam (ZOSYN) IVPB 3.375 g        3.375 g 12.5 mL/hr over 240 Minutes Intravenous 3 times per day 02/19/12 0942     02/19/12 1000   vancomycin (VANCOCIN) 2,000 mg in sodium chloride 0.9 % 500 mL IVPB        2,000 mg 250 mL/hr over 120 Minutes Intravenous  Once 02/19/12 0942 02/19/12 1335   02/16/12 1615   ertapenem (INVANZ) 1 g in sodium chloride 0.9 % 50 mL IVPB  1 g 100 mL/hr over 30 Minutes Intravenous To Surgery 02/16/12 1606 02/16/12 1733   02/16/12 1600   ertapenem (INVANZ) 1 g in sodium chloride 0.9 % 50 mL IVPB  Status:  Discontinued        1 g 100 mL/hr over 30 Minutes Intravenous Every 24 hours 02/16/12 1508 02/19/12 0909   02/16/12 1415   cefTRIAXone (ROCEPHIN) 1 g in dextrose 5 % 50 mL IVPB        1 g 100 mL/hr over 30 Minutes Intravenous  Once 02/16/12 1407 02/16/12 1448          Assessment/Plan: s/p Procedure(s) (LRB) with comments: EXPLORATORY LAPAROTOMY (N/A) - Irrigation and Debridement of abdominal wound with wound vac change with partial closure IRRIGATION AND  DEBRIDEMENT WOUND (N/A) ABDOMINAL VACUUM ASSISTED CLOSURE CHANGE (N/A) VAC change tomorrow - hopefully will be able to close abdominal fascia Transfusion per CCM   LOS: 9 days    Roxan Yamamoto K. 02/25/2012

## 2012-02-25 NOTE — Progress Notes (Signed)
Nutrition Follow-up  Intervention:    TPN per pharmacy RD to follow for nutrition care plan  Assessment:   Patient is currently intubated on ventilator support MV: 12.4 Temp: 36.4  S/p exploratory laparotomy with washout of peritoneal cavity and partial closure of abdominal wall fascia with replacement of vacuum pack dressing 9/22.  Patient is receiving TPN with Clinimix E 5/15 @ 80 ml/hr.  Lipids (20% IVFE @ 10 ml/hr), multivitamins, and trace elements are provided 3 times weekly (MWF) due to national backorder.  Provides 1569 kcal and 96 grams protein daily (based on weekly average).  Meets 73% minimum estimated kcal and 68% minimum estimated protein needs.  Diet Order:  NPO  Meds: Scheduled Meds:   . antiseptic oral rinse  15 mL Mouth Rinse QID  . atropine      . chlorhexidine  15 mL Mouth/Throat BID  . fentaNYL  200 mcg Intravenous Once  . fluconazole (DIFLUCAN) IV  400 mg Intravenous Q24H  . insulin aspart  0-20 Units Subcutaneous Q4H  . magnesium sulfate 1 - 4 g bolus IVPB  1 g Intravenous Once  . midazolam  4 mg Intravenous Once  . pantoprazole (PROTONIX) IV  40 mg Intravenous QHS  . piperacillin-tazobactam (ZOSYN)  IV  3.375 g Intravenous Q8H  . sodium chloride  10-40 mL Intracatheter Q12H  . vancomycin  1,000 mg Intravenous Q12H   Continuous Infusions:   . sodium chloride 20 mL/hr at 02/25/12 0800  . dexmedetomidine 1.2 mcg/kg/hr (02/25/12 1000)  . fat emulsion 240 mL (02/25/12 0700)  . fentaNYL infusion INTRAVENOUS 400 mcg/hr (02/25/12 1000)  . TPN (CLINIMIX) +/- additives 50 mL/hr at 02/24/12 0800  . TPN (CLINIMIX) +/- additives 80 mL/hr at 02/25/12 1000  . DISCONTD: norepinephrine (LEVOPHED) Adult infusion 2 mcg/min (02/23/12 2300)   PRN Meds:.fentaNYL, haloperidol lactate, midazolam, sodium chloride  Labs:  CMP     Component Value Date/Time   NA 138 02/25/2012 0508   K 4.5 02/25/2012 0508   CL 111 02/25/2012 0508   CO2 21 02/25/2012 0508   GLUCOSE 247*  02/25/2012 0508   BUN 20 02/25/2012 0508   CREATININE 0.97 02/25/2012 0508   CREATININE 1.12 07/04/2011 1516   CALCIUM 7.2* 02/25/2012 0508   PROT 5.5* 02/25/2012 0508   ALBUMIN 1.2* 02/25/2012 0508   AST 36 02/25/2012 0508   ALT 25 02/25/2012 0508   ALKPHOS 97 02/25/2012 0508   BILITOT 1.3* 02/25/2012 0508   GFRNONAA 88* 02/25/2012 0508   GFRAA >90 02/25/2012 0508    Phosphorus  Date Value Range Status  02/24/2012 3.6  2.3 - 4.6 mg/dL Final    Magnesium  Date Value Range Status  02/25/2012 1.7  1.5 - 2.5 mg/dL Final     Intake/Output Summary (Last 24 hours) at 02/25/12 1014 Last data filed at 02/25/12 0900  Gross per 24 hour  Intake 4561.45 ml  Output   2150 ml  Net 2411.45 ml    CBG (last 3)   Basename 02/25/12 0804 02/25/12 0334 02/25/12 0020  GLUCAP 180* 186* 164*    Weight Status: 110.6 kg (9/24) -- fluctuating  Re-estimated needs: 2150 kcals, 140-150 gm protein   Nutrition Dx: Inadequate Oral Intake r/t inability to eat as evidenced by NPO status, ongoing   New Goal: TPN to meet >90% of estimated protein needs, maximize energy provision as able during national lipid backorder, unmet   Monitor: TPN prescription, respiratory status, weight, labs, I/O's   Phillips Odor, RD, LDN  Pager #:  [place] Pager #: [phone]

## 2012-02-25 NOTE — Progress Notes (Signed)
Name: [name] MRN: [id number] DOB: [birth year]    LOS: 9 days Date of admit: 02/16/2012 PCP is [name], MD   Referring Provider:  [name] of [facility] Reason for Referral:  Post op resp failure, metabolic acidosis and hypotension   PULMONARY / CRITICAL CARE MEDICINE  HPI: 61 yo male smoker s/p colonoscopy and found to have 3-4 cm sessile mass in distal transverse colon.  Had laparoscopic Rt hemicolectomy 9/05, and d/c home 9/12.  He developed N/V and abd pain, and found to have pneumoperitoneum 9/15 from anastomotic leak.  He had ileocolonic resection, ileostomy, and wound vac.  He remained on vent post-op, and PCCM consulted.  Events Since Admission: 02/16/2012 >>Laparotomy  SUBJECTIVE: Agitated at times, ETT out too far   Vital Signs: Temp:  [96.5 F (35.8 C)-98.2 F (36.8 C)] 97.6 F (36.4 C) (09/24 0802) Pulse Rate:  [41-99] 50  (09/24 0735) Resp:  [12-17] 12  (09/24 0800) BP: (83-128)/(47-74) 107/61 mmHg (09/24 0800) SpO2:  [89 %-100 %] 100 % (09/24 0735) FiO2 (%):  [29.8 %-30.4 %] 30 % (09/24 0827) Weight:  [110.6 kg (243 lb 13.3 oz)] 110.6 kg (243 lb 13.3 oz) (09/24 0600)  Physical Examination: General - intermittent agitation HEENT - ett in place but out too far Cardiac - s1s2 regular, no murmur Chest - no wheeze Abd - soft, mildly distended, ileostomy and wound vac in place Ext - no edema Neuro - agitated , shaking head to and fro    ASSESSMENT AND PLAN Principal Problem:  *Peritonitis Active Problems:  Type II diabetes mellitus, well controlled  OBSTRUCTIVE SLEEP APNEA  Obesity (BMI 30.0-34.9)  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  ARF (acute renal failure)  Acute respiratory failure with hypoxia  Acidosis  Hypotension  Severe sepsis  Protein-calorie malnutrition, severe    PULMONARY  Lab 02/20/12 1209 02/19/12 0416  PHART 7.418 7.382  PCO2ART 31.7* 31.8*  PO2ART 107.0* 159.0*  HCO3 20.5 18.8*  O2SAT 98.0  99.0   Ventilator Settings: Vent Mode:  [-] PRVC FiO2 (%):  [29.8 %-30.4 %] 30 % Set Rate:  [12 bmp] 12 bmp Vt Set:  [600 mL] 600 mL PEEP:  [5 cmH20-5.4 cmH20] 5 cmH20 Pressure Support:  [14 cmH20] 14 cmH20 Plateau Pressure:  [17 cmH20-22 cmH20] 21 cmH20  ETT 02/16/2012 >> 02/20/12 (self extubated post OR)  ETT 02/20/12 >>  A:  Acute respiratory failure in the setting of peritonitis. Hx of OSA. P:   Advance ETT 4cm Continue vent support until abdominal issues stable F/u CXR Titrate oxygen to keep SpO2 > 92%  CARDIOVASCULAR  Lab 02/24/12 0844 02/24/12 0225 02/23/12 2040 02/21/12 0839 02/19/12 0930  TROPONINI <0.30 <0.30 <0.30 -- --  LATICACIDVEN -- -- -- -- 1.3  PROBNP -- -- -- 295.9* --   Rt IJ CVL 9/15>> A line 02/16/2012 >>02/19/12  A: Septic shock from peritonitis>>resolved. Hx of HTN. P:  Hold anti-HTN meds for now   RENAL  Lab 02/25/12 0508 02/24/12 0225 02/23/12 0400 02/22/12 0418 02/21/12 0400 02/20/12 0445  NA 138 137 138 141 139 --  K 4.5 5.0 -- -- -- --  CL 111 109 109 112 110 --  CO2 21 21 22 21 21  --  BUN 20 27* 26* 29* 25* --  CREATININE 0.97 1.18 1.23 1.35 1.28 --  CALCIUM 7.2* 7.4* 7.7* 7.4* 7.2* --  MG 1.7 1.8 1.9 -- 2.1 2.3  PHOS -- 3.6 3.5 3.5 3.1 2.6   Intake/Output  09/23 0701 - 09/24 0700 09/24 0701 - 09/25 0700   I.V. (mL/kg) 3026.5 (27.4)    NG/GT 30 30   IV Piggyback 885    TPN 2335.2    Total Intake(mL/kg) 6276.7 (56.8) 30 (0.3)   Urine (mL/kg/hr) 1775 (0.7)    Emesis/NG output 300 100   Drains 165    Stool 50    Total Output 2290 100   Net +3986.7 -70         Foley: 02/16/2012   A: Acute renal failure from shock>>improving.  Metabolic acidosis>>resolved.   P Monitor BMET daily   GASTROINTESTINAL  Lab 02/25/12 0508 02/24/12 0225 02/23/12 0400  AST 36 36 36  ALT 25 18 18   ALKPHOS 97 77 87  BILITOT 1.3* 2.0* 2.1*  PROT 5.5* 5.3* 5.6*  ALBUMIN 1.2* 1.1* 1.3*    A: Acute peritonitis 2nd to anastomotic leak.    Unable to close abdomen 9/22  ?return to OR soon?  Nutrition for severe protein calorie malnutrition  P:   Return to OR per [facility] 9/25 Continue TPN  HEMATOLOGIC  Lab 02/25/12 0341 02/24/12 0225 02/23/12 0400 02/22/12 0418 02/21/12 0400  HGB 7.7* 8.9* 9.3* 9.9* 10.2*  HCT 23.1* 25.9* 26.5* 28.8* 29.0*  PLT 273 324 294 286 270  INR -- -- -- -- --  APTT -- -- -- -- --   A: Anemia of critical illness. P:  F/u CBC Transfuse for Hb < 7  INFECTIOUS  Lab 02/25/12 0341 02/24/12 0225 02/23/12 0400 02/22/12 0418 02/21/12 0400 02/19/12 0930 02/19/12 0406  WBC 8.8 13.7* 14.0* 14.3* 13.8* -- --  PROCALCITON -- -- -- 2.55 -- 8.20 9.73   Cultures: Urine 9/15>>negative Blood 9/18>>neg Urine 9/18>>negative Sputum 9/18>>Candida  Antibiotics: Rocephin 9/15>>9/15 Ertapenem 9/15>>9/17 Zosyn (abd leak, peritonitis) 9/18>> Vancomycin (abd leak, peritonitis)9/18>> Diflucan (peritonitis, abd leak) 9/20>>  A: Septic shock from peritonitis.  Procalcitonin trending down.  Recurrent fever 9/18 and 9/20>>improved with change in Abx and adding anti-fungal. P:   Abx as above  ENDOCRINE  Lab 02/25/12 0804 02/25/12 0334 02/25/12 0020 02/24/12 2009 02/24/12 1541  GLUCAP 180* 186* 164* 134* 147*   A: DM type II Ok control P:   SSI  NEUROLOGIC  A: Sedation.  P:   Continue precedex and fentanyl gtt Versed, haldol prn  BEST PRACTICE / DISPOSITION Level of Care:  ICU Primary Service:  [facility] Consultants:  PCCM Code Status:  Full Diet:  TPN DVT Px:  SCD GI Px:  Protonix  Critical care time 35 minutes.  Mariel Sleet [place]  [phone]  Cell  [phone]  If no response or cell goes to voicemail, call beeper [phone]  02/25/2012, 8:43 AM

## 2012-02-25 NOTE — Progress Notes (Signed)
PARENTERAL NUTRITION CONSULT NOTE - Follow up  Pharmacy Consult for TPN Indication: Prolonged ileus  No Known Allergies  Patient Measurements: Height: 5\' 11"  (180.3 cm) Weight: 243 lb 13.3 oz (110.6 kg) IBW/kg (Calculated) : 75.3  Ideal Body Weight: 80 Usual Weight: 117kg  Vital Signs: Temp: 97.6 F (36.4 C) (09/24 0802) Temp src: Oral (09/24 0802) BP: 107/61 mmHg (09/24 0800) Pulse Rate: 50  (09/24 0735) Intake/Output from previous day: 09/23 0701 - 09/24 0700 In: 6276.7 [I.V.:3026.5; NG/GT:30; IV Piggyback:885; TPN:2335.2] Out: 2290 [Urine:1775; Emesis/NG output:300; Drains:165; Stool:50] Intake/Output from this shift: Total I/O In: 30 [NG/GT:30] Out: 100 [Emesis/NG output:100]  Labs:  [facility] 02/25/12 0341 02/24/12 0225 02/23/12 0400  WBC 8.8 13.7* 14.0*  HGB 7.7* 8.9* 9.3*  HCT 23.1* 25.9* 26.5*  PLT 273 324 294  APTT -- -- --  INR -- -- --     Basename 02/25/12 0508 02/24/12 0225 02/23/12 0400 02/22/12 1000  NA 138 137 138 --  K 4.5 5.0 4.9 --  CL 111 109 109 --  CO2 21 21 22  --  GLUCOSE 247* 225* 116* --  BUN 20 27* 26* --  CREATININE 0.97 1.18 1.23 --  LABCREA -- -- -- --  CREAT24HRUR -- -- -- --  CALCIUM 7.2* 7.4* 7.7* --  MG 1.7 1.8 1.9 --  PHOS -- 3.6 3.5 --  PROT 5.5* 5.3* 5.6* --  ALBUMIN 1.2* 1.1* 1.3* --  AST 36 36 36 --  ALT 25 18 18  --  ALKPHOS 97 77 87 --  BILITOT 1.3* 2.0* 2.1* --  BILIDIR -- -- -- --  IBILI -- -- -- --  PREALBUMIN -- 4.0* -- 3.8*  TRIG -- 198* 209* --  CHOLHDL -- -- -- --  CHOL -- 81 -- --   Estimated Creatinine Clearance: 102.4 ml/min (by C-G formula based on Cr of 0.97).    Basename 02/25/12 0804 02/25/12 0334 02/25/12 0020  GLUCAP 180* 186* 164*     Insulin Requirements in the past 24 hours:  18 units SSI, 20 units in TPN  Current Nutrition:  Clinimix 5/15 at 33mL/hr (no electrolyte formula due to elevated K)  Nutritional Goals:  2190 kCal, 140-150 grams of protein per day per RD  Clinimix 5/15  at 110 cc/hr will provide 132 gm protein/d (94% of needs) and 2088 avg kcal/d (95% of needs)   Assessment: 61 year old man underwent hemicolectomy on 9/5 2/2 stage II colon CA and was discharged on 9/12.  He was readmitted 9/14 and underwent surgery for anastomatic leak and ileostomy on 9/15.  TPN initiated for prolonged ileus and prolonged fasting.   GI:  OR 9/22 for VAC change, edema precluded full closure of wound, plan closure Wed 9/25. Neg pressure drain 154mL output/24. 461mL bile output/24 from NGT. 50 mL stool output/24 in ostomy Endo:  Hx IDDM. CBGs 3/4 > 150. On resistant SSI Lytes:  Na 138, K 4.5, Phos 3.6, Mag 1.7, Corr Ca 9.44.  Renal:  SCr 0.97 (down), CrCl ~ 100 ml/min.  UOP 0.7 mL/kg/hr (slight down, no Lasix yesterday). MIVF NS at [facility] ID:  Afeb, WBC 8.8 (down). Vancomycin D7, fluconazole D5 and Zosyn D7 on board for peritonitis 2/2 anastomotic leak.  Has been on antibiotics since 9/15. Urine 9/15>>NG  Blood 9/18>>NG  Urine 9/18>>NG  Sputum 9/18>> few Candida  Heme/Onc:  H/H 7.7/23.1 (down ? Dilutional with increase in TPN rate).  Plts 273 (stable). SCD for VTE px. Stage II adenocarcinoma of the colon Pulm:  Hx OSA. On Vent, FiO2 30% Cards:  Hx HTN, HLD. BPs low nml, off pressors, P 50. CE (-) x3. Sinus brady on EKG Hepatobil:  LFTs/Alk Phos wnl. Alb extremely low at 1.1 in the setting of ongoing inflammation with SIRS/sepsis/trips to OR. TG high at 198. Prealbumin very low at 4.0 with ongoing illness, expect this to increase with nutritional support Neuro:  Precedex @1 .2/kg/hr, [id number] 400/hr, Versed prn, pt has had periods of agitation since admit. GCS 13, RASS -1 (at goal)  Best Practices: SCDs, MC, PPI TPN Access: PICC triple lumen placed 9/22 TPN day#: 3  Plan:  - Change TPN formula to Clinimix E 5/15 at 19mL/hr. K no longer elevated and renal function improved, therefore will add electrolytes to TPN. Formula will provide 96 g protein and 1577 Kcal. Meets 72% minimum  estimated kcal and 68% minimum estimated protein needs. Will hold off on increasing rate due to volume status and return trip to OR tomorrow for abd closure. - Increase insulin in TPN bag to 30 units with history of type 2 diabetes and CBGs above goal - Continue SSI q4h - Multivitamins, trace elements, and lipids will be added on MWF only due to national shortage. - F/u BMET in AM - TPN labs Thurs   Thank-you for this consult.  Johny Drilling, PharmD Clinical Pharmacist Pager: [phone] Pharmacy: [phone] 02/25/2012 11:49 AM

## 2012-02-25 NOTE — Progress Notes (Signed)
ETT noted at 9, patient moved with tongue and crossed in mouth, sedated by RN and ETT advanced back to 25 per Dr. Joya Gaskins.

## 2012-02-26 ENCOUNTER — Inpatient Hospital Stay (HOSPITAL_COMMUNITY): Payer: Medicaid Other | Admitting: Anesthesiology

## 2012-02-26 ENCOUNTER — Encounter (HOSPITAL_COMMUNITY): Payer: Self-pay | Admitting: Anesthesiology

## 2012-02-26 ENCOUNTER — Inpatient Hospital Stay (HOSPITAL_COMMUNITY): Payer: Medicaid Other

## 2012-02-26 ENCOUNTER — Encounter (HOSPITAL_COMMUNITY)
Admission: EM | Disposition: A | Discharge status: Skilled Nursing Facility | Payer: Self-pay | Source: Home / Self Care | Attending: Surgery

## 2012-02-26 DIAGNOSIS — S31109A Unspecified open wound of abdominal wall, unspecified quadrant without penetration into peritoneal cavity, initial encounter: Secondary | ICD-10-CM

## 2012-02-26 HISTORY — PX: APPLICATION OF WOUND VAC: SHX5189

## 2012-02-26 HISTORY — PX: LAPAROTOMY: SHX154

## 2012-02-26 LAB — CBC
HCT: 20.9 % — ABNORMAL LOW (ref 39.0–52.0)
Hemoglobin: 7.1 g/dL — ABNORMAL LOW (ref 13.0–17.0)
MCH: 24.7 pg — ABNORMAL LOW (ref 26.0–34.0)
MCHC: 34 g/dL (ref 30.0–36.0)
MCV: 72.8 fL — ABNORMAL LOW (ref 78.0–100.0)
Platelets: 294 10*3/uL (ref 150–400)
RBC: 2.87 MIL/uL — ABNORMAL LOW (ref 4.22–5.81)
RDW: 20.8 % — ABNORMAL HIGH (ref 11.5–15.5)
WBC: 8.6 10*3/uL (ref 4.0–10.5)

## 2012-02-26 LAB — BASIC METABOLIC PANEL
BUN: 14 mg/dL (ref 6–23)
CO2: 17 mEq/L — ABNORMAL LOW (ref 19–32)
Calcium: 5.8 mg/dL — CL (ref 8.4–10.5)
Chloride: 120 mEq/L — ABNORMAL HIGH (ref 96–112)
Creatinine, Ser: 0.74 mg/dL (ref 0.50–1.35)
GFR calc Af Amer: >90 mL/min (ref >90)
GFR calc non Af Amer: >90 mL/min (ref >90)
Glucose, Bld: 103 mg/dL — ABNORMAL HIGH (ref 70–99)
Potassium: 3.4 mEq/L — ABNORMAL LOW (ref 3.5–5.1)
Sodium: 142 mEq/L (ref 135–145)

## 2012-02-26 LAB — GLUCOSE, CAPILLARY
Glucose-Capillary: 136 mg/dL — ABNORMAL HIGH (ref 70–99)
Glucose-Capillary: 102 mg/dL — ABNORMAL HIGH (ref 70–99)
Glucose-Capillary: 86 mg/dL (ref 70–99)
Glucose-Capillary: 183 mg/dL — ABNORMAL HIGH (ref 70–99)
Glucose-Capillary: 198 mg/dL — ABNORMAL HIGH (ref 70–99)

## 2012-02-26 LAB — PREPARE RBC (CROSSMATCH)

## 2012-02-26 IMAGING — CR DG CHEST 1V PORT
1 series · 1 of 1 positions shown · non-contrast
Comparison: [DATE].

CLINICAL DATA: Atelectasis.

PORTABLE CHEST - 1 VIEW

[AP]
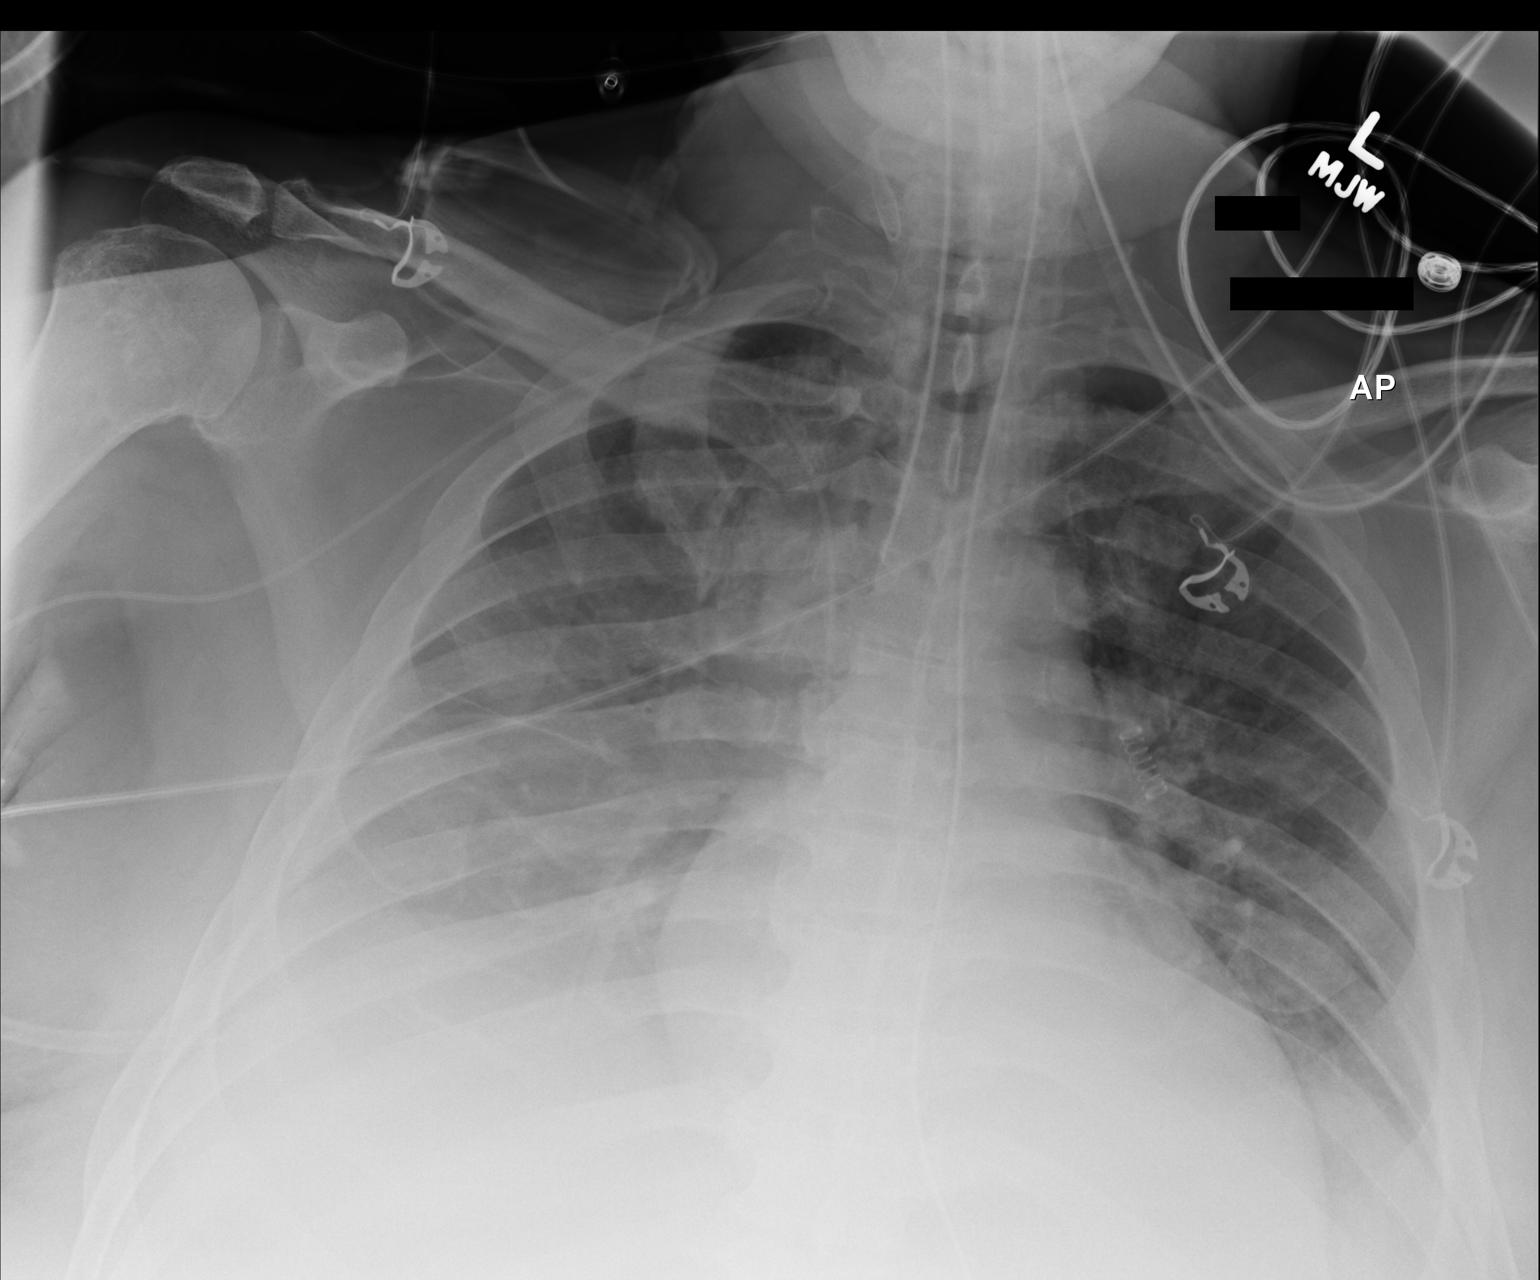

[1 of 1 positions shown; findings below may reference images not displayed]

FINDINGS: Endotracheal tube is in satisfactory position.  The
nasogastric tube is followed into the stomach with the tip
projecting beyond the inferior boundary the film.  Right PICC tip
projects over the SVC.  Heart size stable.  Diffuse bilateral air
space disease and bilateral pleural effusions persist. Left lower
lobe collapse/consolidation.
IMPRESSION: Diffuse bilateral air space disease, bilateral pleural effusions
and left lower lobe collapse/consolidation persist.

## 2012-02-26 SURGERY — LAPAROTOMY, EXPLORATORY
Anesthesia: General | Site: Abdomen | Wound class: Contaminated

## 2012-02-26 MED ORDER — ZINC TRACE METAL 1 MG/ML IV SOLN
INTRAVENOUS | Status: AC
  Administered 2012-02-26: 17:00:00 via INTRAVENOUS
  Filled 2012-02-26: qty 2640

## 2012-02-26 MED ORDER — HYDROMORPHONE HCL PF 1 MG/ML IJ SOLN: 0.2500 mg | INTRAMUSCULAR | Status: DC | PRN

## 2012-02-26 MED ORDER — POTASSIUM CHLORIDE 10 MEQ/50ML IV SOLN
10.0000 meq | INTRAVENOUS | Status: AC
  Administered 2012-02-26 (×4): 10 meq via INTRAVENOUS
  Filled 2012-02-26: qty 200

## 2012-02-26 MED ORDER — OXYCODONE HCL 5 MG PO TABS
5.0000 mg | ORAL_TABLET | Freq: Once | ORAL | Status: AC | PRN
Start: 2012-02-26 — End: 2012-02-26

## 2012-02-26 MED ORDER — 0.9 % SODIUM CHLORIDE (POUR BTL) OPTIME
TOPICAL | Status: DC | PRN
  Administered 2012-02-26: 1000 mL

## 2012-02-26 MED ORDER — VECURONIUM BROMIDE 10 MG IV SOLR
INTRAVENOUS | Status: DC | PRN
  Administered 2012-02-26: 4 mg via INTRAVENOUS

## 2012-02-26 MED ORDER — OXYCODONE HCL 5 MG/5ML PO SOLN: 5.0000 mg | Freq: Once | ORAL | Status: AC | PRN

## 2012-02-26 MED ORDER — SODIUM CHLORIDE 0.9 % IV SOLN
2500.0000 ug | INTRAVENOUS | Status: DC | PRN
  Administered 2012-02-26: 400 ug/h via INTRAVENOUS

## 2012-02-26 MED ORDER — EPHEDRINE SULFATE 50 MG/ML IJ SOLN
INTRAMUSCULAR | Status: DC | PRN
  Administered 2012-02-26: 5 mg via INTRAVENOUS

## 2012-02-26 MED ORDER — DEXMEDETOMIDINE HCL 100 MCG/ML IV SOLN
200.0000 ug | INTRAVENOUS | Status: DC | PRN
  Administered 2012-02-26: 1.2 ug/kg/h via INTRAVENOUS

## 2012-02-26 MED ORDER — LACTATED RINGERS IV SOLN
INTRAVENOUS | Status: DC | PRN
  Administered 2012-02-26: 09:00:00 via INTRAVENOUS

## 2012-02-26 MED ORDER — MEPERIDINE HCL 50 MG/ML IJ SOLN: 6.2500 mg | INTRAMUSCULAR | Status: DC | PRN

## 2012-02-26 MED ORDER — FAT EMULSION 20 % IV EMUL
250.0000 mL | INTRAVENOUS | Status: AC
  Administered 2012-02-26: 250 mL via INTRAVENOUS
  Filled 2012-02-26: qty 250

## 2012-02-26 MED ORDER — FUROSEMIDE 10 MG/ML IJ SOLN
40.0000 mg | Freq: Four times a day (QID) | INTRAMUSCULAR | Status: AC
  Administered 2012-02-26 (×2): 40 mg via INTRAVENOUS
  Filled 2012-02-26 (×2): qty 4

## 2012-02-26 MED ORDER — MIDAZOLAM HCL 5 MG/5ML IJ SOLN
INTRAMUSCULAR | Status: DC | PRN
  Administered 2012-02-26: 2 mg via INTRAVENOUS

## 2012-02-26 MED ORDER — ONDANSETRON HCL 4 MG/2ML IJ SOLN: 4.0000 mg | Freq: Once | INTRAMUSCULAR | Status: AC | PRN

## 2012-02-26 SURGICAL SUPPLY — 45 items
BLADE SURG ROTATE 9660 (MISCELLANEOUS) IMPLANT
CANISTER SUCTION 2500CC (MISCELLANEOUS) ×2 IMPLANT
CANISTER WOUND CARE 500ML ATS (WOUND CARE) ×1 IMPLANT
CHLORAPREP W/TINT 26ML (MISCELLANEOUS) ×1 IMPLANT
CLOTH BEACON ORANGE TIMEOUT ST (SAFETY) ×2 IMPLANT
COVER SURGICAL LIGHT HANDLE (MISCELLANEOUS) ×2 IMPLANT
DRAPE LAPAROSCOPIC ABDOMINAL (DRAPES) ×2 IMPLANT
DRAPE UTILITY 15X26 W/TAPE STR (DRAPE) ×2 IMPLANT
DRAPE WARM FLUID 44X44 (DRAPE) IMPLANT
DRSG VAC ATS LRG SENSATRAC (GAUZE/BANDAGES/DRESSINGS) ×1 IMPLANT
ELECT CAUTERY BLADE 6.4 (BLADE) ×1 IMPLANT
ELECT REM PT RETURN 9FT ADLT (ELECTROSURGICAL) ×2
ELECTRODE REM PT RTRN 9FT ADLT (ELECTROSURGICAL) ×1 IMPLANT
GAUZE SPONGE 4X4 16PLY XRAY LF (GAUZE/BANDAGES/DRESSINGS) IMPLANT
GLOVE BIO SURGEON STRL SZ7 (GLOVE) ×2 IMPLANT
GLOVE BIO SURGEON STRL SZ7.5 (GLOVE) ×1 IMPLANT
GLOVE BIOGEL PI IND STRL 7.5 (GLOVE) ×1 IMPLANT
GLOVE BIOGEL PI INDICATOR 7.5 (GLOVE) ×3
GOWN STRL NON-REIN LRG LVL3 (GOWN DISPOSABLE) ×5 IMPLANT
KIT BASIN OR (CUSTOM PROCEDURE TRAY) ×2 IMPLANT
KIT COLOSTOMY ILEOSTOMY 2.75 (WOUND CARE) ×1 IMPLANT
KIT ROOM TURNOVER OR (KITS) ×2 IMPLANT
LIGASURE IMPACT 36 18CM CVD LR (INSTRUMENTS) IMPLANT
NS IRRIG 1000ML POUR BTL (IV SOLUTION) ×2 IMPLANT
PACK GENERAL/GYN (CUSTOM PROCEDURE TRAY) ×2 IMPLANT
PAD ARMBOARD 7.5X6 YLW CONV (MISCELLANEOUS) ×3 IMPLANT
RETAINER VISCERA MED (MISCELLANEOUS) ×1 IMPLANT
SPECIMEN JAR LARGE (MISCELLANEOUS) IMPLANT
SPONGE GAUZE 4X4 12PLY (GAUZE/BANDAGES/DRESSINGS) ×1 IMPLANT
SPONGE LAP 18X18 X RAY DECT (DISPOSABLE) IMPLANT
STAPLER VISISTAT 35W (STAPLE) ×1 IMPLANT
SUCTION POOLE TIP (SUCTIONS) ×1 IMPLANT
SUT NOVA 1 T20/GS 25DT (SUTURE) ×5 IMPLANT
SUT PDS AB 1 TP1 96 (SUTURE) IMPLANT
SUT SILK 2 0 SH CR/8 (SUTURE) ×1 IMPLANT
SUT SILK 2 0 TIES 10X30 (SUTURE) ×1 IMPLANT
SUT SILK 3 0 SH CR/8 (SUTURE) ×1 IMPLANT
SUT SILK 3 0 TIES 10X30 (SUTURE) ×1 IMPLANT
SUT VIC AB 3-0 SH 18 (SUTURE) IMPLANT
TOWEL OR 17X24 6PK STRL BLUE (TOWEL DISPOSABLE) ×2 IMPLANT
TOWEL OR 17X26 10 PK STRL BLUE (TOWEL DISPOSABLE) ×1 IMPLANT
TRAY FOLEY CATH 14FRSI W/METER (CATHETERS) IMPLANT
UNDERPAD 30X30 INCONTINENT (UNDERPADS AND DIAPERS) IMPLANT
WATER STERILE IRR 1000ML POUR (IV SOLUTION) ×1 IMPLANT
YANKAUER SUCT BULB TIP NO VENT (SUCTIONS) IMPLANT

## 2012-02-26 NOTE — Progress Notes (Signed)
Pt just received back from OR. Placed back on current vent settings. RT will continue to monitor.

## 2012-02-26 NOTE — Anesthesia Preprocedure Evaluation (Addendum)
Anesthesia Evaluation  Patient identified by MRN, date of birth, ID band  Reviewed: Allergy & Precautions, H&P , NPO status , Patient's Chart, lab work & pertinent test results  Airway       Dental   Pulmonary sleep apnea , Current Smoker,          Cardiovascular hypertension, + Valvular Problems/Murmurs     Neuro/Psych PSYCHIATRIC DISORDERS    GI/Hepatic   Endo/Other  diabetes  Renal/GU Renal disease     Musculoskeletal   Abdominal   Peds  Hematology   Anesthesia Other Findings   Reproductive/Obstetrics                           Anesthesia Physical Anesthesia Plan  ASA: IV  Anesthesia Plan: General   Post-op Pain Management:    Induction: Intravenous  Airway Management Planned:   Additional Equipment:   Intra-op Plan:   Post-operative Plan: Post-operative intubation/ventilation  Informed Consent: I have reviewed the patients History and Physical, chart, labs and discussed the procedure including the risks, benefits and alternatives for the proposed anesthesia with the patient or authorized representative who has indicated his/her understanding and acceptance.     Plan Discussed with: CRNA and Surgeon  Anesthesia Plan Comments:         Anesthesia Quick Evaluation

## 2012-02-26 NOTE — Transfer of Care (Signed)
Immediate Anesthesia Transfer of Care Note  Patient: [name]  Procedure(s) Performed: Procedure(s) (LRB) with comments: EXPLORATORY LAPAROTOMY (N/A) -   APPLICATION OF WOUND VAC (N/A)  Patient Location: SICU  Anesthesia Type: General  Level of Consciousness: sedated  Airway & Oxygen Therapy: Patient remains intubated per anesthesia plan and Patient placed on Ventilator (see vital sign flow sheet for setting)  Post-op Assessment: Report given to PACU RN and Post -op Vital signs reviewed and stable  Post vital signs: Reviewed and stable  Complications: No apparent anesthesia complications

## 2012-02-26 NOTE — Progress Notes (Signed)
CRITICAL VALUE ALERT  Critical value received:  Ca-5.8  Date of notification:  02/26/12  Time of notification:  [id number]  Critical value read back:yes  Nurse who received alert:  Anselm Pancoast, RN  MD notified (1st page):  Dr. Elsworth Soho  Time of first page:  0534  MD notified (2nd page):  Time of second page:  Responding MD:  Dr. Elsworth Soho  Time MD responded:  [phone]

## 2012-02-26 NOTE — Anesthesia Postprocedure Evaluation (Signed)
Anesthesia Post Note  Patient: [name]  Procedure(s) Performed: Procedure(s) (LRB): EXPLORATORY LAPAROTOMY (N/A) APPLICATION OF WOUND VAC (N/A)  Anesthesia type: General  Patient location: ICU  Post pain: Pain level controlled  Post assessment: Post-op Vital signs reviewed  Last Vitals:  Filed Vitals:   02/26/12 1154  BP:   Pulse:   Temp: 36.9 C  Resp:     Post vital signs: stable  Level of consciousness: Patient remains intubated per anesthesia plan  Complications: No apparent anesthesia complications

## 2012-02-26 NOTE — Progress Notes (Signed)
Patient ID: [name], male   DOB: [birth year], 61 y.o.   MRN: [id number]  Patient is scheduled for OR this morning to attempt abdominal wall closure.  Hemoglobin has been slowly trending down and is 7.1 this morning.  Will transfuse two units PRBC - hopefully before surgery.  Imogene Burn. [name], MD, [facility]  02/26/2012 7:40 AM

## 2012-02-26 NOTE — Progress Notes (Signed)
PARENTERAL NUTRITION CONSULT NOTE - Follow up  Pharmacy Consult for TPN Indication: Prolonged ileus  No Known Allergies  Patient Measurements: Height: 5\' 11"  (180.3 cm) Weight: 247 lb 9.2 oz (112.3 kg) IBW/kg (Calculated) : 75.3  Ideal Body Weight: 80 Usual Weight: 117kg  Vital Signs: Temp: 98.8 F (37.1 C) (09/25 0825) Temp src: Oral (09/25 0825) BP: 115/65 mmHg (09/25 0728) Pulse Rate: 58  (09/25 0728) Intake/Output from previous day: 09/24 0701 - 09/25 0700 In: 4887.3 [I.V.:1972.3; NG/GT:120; IV Piggyback:775; TPN:2020] Out: 2485 [Urine:1645; Emesis/NG output:450; Drains:350; Stool:40] Intake/Output from this shift: Total I/O In: 250 [I.V.:250] Out: -   Labs:  [facility] 02/26/12 0415 02/25/12 0341 02/24/12 0225  WBC 8.6 8.8 13.7*  HGB 7.1* 7.7* 8.9*  HCT 20.9* 23.1* 25.9*  PLT 294 273 324  APTT -- -- --  INR -- -- --     Basename 02/26/12 0415 02/25/12 0508 02/24/12 0225  NA 142 138 137  K 3.4* 4.5 5.0  CL 120* 111 109  CO2 17* 21 21  GLUCOSE 103* 247* 225*  BUN 14 20 27*  CREATININE 0.74 0.97 1.18  LABCREA -- -- --  CREAT24HRUR -- -- --  CALCIUM 5.8* 7.2* 7.4*  MG -- 1.7 1.8  PHOS -- -- 3.6  PROT -- 5.5* 5.3*  ALBUMIN -- 1.2* 1.1*  AST -- 36 36  ALT -- 25 18  ALKPHOS -- 97 77  BILITOT -- 1.3* 2.0*  BILIDIR -- -- --  IBILI -- -- --  PREALBUMIN -- -- 4.0*  TRIG -- -- 198*  CHOLHDL -- -- --  CHOL -- -- 81   Estimated Creatinine Clearance: 125.1 ml/min (by C-G formula based on Cr of 0.74).    Basename 02/26/12 0753 02/26/12 0352 02/26/12 0027  GLUCAP 86 102* 136*     Insulin Requirements in the past 24 hours:  7 units SSI, 30 units in TPN  Current Nutrition:  Clinimix E 5/15 at 67mL/hr   Nutritional Goals:  2190 kCal, 140-150 grams of protein per day per RD   Assessment: 61 year old man underwent hemicolectomy on 9/5 2/2 stage II colon CA and was discharged on 9/12.  He was readmitted 9/14 and underwent surgery for anastomatic leak  and ileostomy on 9/15.  TPN initiated for prolonged ileus and prolonged fasting.   GI:  OR 9/22 for VAC change, edema precluded full closure of wound. 9/25 Abd fascia closed. Distended abd w hypoactive BS. Neg pressure drain 316mL output/24. 470mL bile output/24 from NGT. 61mL stool output/24 in ostomy Endo:  Hx IDDM. CBGs 86-153. On resistant SSI Lytes:  Na 142, K 3.4 (K runs x 4 ordered), Corr Ca 8.04  Renal:  SCr 0.74 (down), CrCl > 100 ml/min.  UOP 0.6 mL/kg/hr (slight down). M[facility]F NS at [facility] ID:  Afeb, WBC 8.6 (stable). Vancomycin D8, fluconazole D6 and Zosyn D7 on board for peritonitis 2/2 anastomotic leak.  Has been on antibiotics since 9/15. Urine 9/15>>NG  Blood 9/18>>NG  Urine 9/18>>NG  Sputum 9/18>> few Candida  Heme/Onc:  H/H 7.1/20.9 (down)- 2 units PRBC ordered.  Plts 294 (stable). SCD for VTE px. Stage II adenocarcinoma of the colon Pulm:  Hx OSA. On Vent, FiO2 30%. CXR sows LLL collapse/consolidations Cards:  Hx HTN, HLD. BPs low nml, off pressors, P 50. CE (-) x3. Sinus brady on EKG Hepatobil:  LFTs/Alk Phos wnl. Alb extremely low at 1.2 in the setting of ongoing inflammation with SIRS/sepsis/trips to OR. TG high at 198. Prealbumin very  low at 4.0 with ongoing illness, expect this to increase with nutritional support Neuro:  Precedex @1 .2/kg/hr, [id number] 400/hr, Versed prn, pt has had periods of agitation since admit. GCS 13, RASS 1 (goal 0)  Best Practices: SCDs, MC, PPI TPN Access: PICC triple lumen placed 9/22 TPN day#: 4  Plan:  - Increase Clinimix E 5/15 to 110mL/hr to promote healing now that abdomen is closed. Formula will provide 132 gm protein/d (94% of needs) and 2088 avg kcal/d (95% of needs) - Decrease insulin in TPN to 25 units to avoid hypoglycemia - Continue SSI q4h - Multivitamins, trace elements, and lipids will be added on MWF only due to national shortage. - TPN labs in AM   Thank you for the consult.  Johny Drilling, PharmD Clinical Pharmacist Pager:  [phone] Pharmacy: [phone] 02/26/2012 4:33 PM

## 2012-02-26 NOTE — Progress Notes (Signed)
Patient just arrived back from OR.

## 2012-02-26 NOTE — Progress Notes (Addendum)
Name: [name] MRN: [id number] DOB: [birth year]    LOS: 10 days Date of admit: 02/16/2012 PCP is [name], MD   Referring Provider:  [name] of [facility] Reason for Referral:  Post op resp failure, metabolic acidosis and hypotension   PULMONARY / CRITICAL CARE MEDICINE  HPI: 61 yo male smoker s/p colonoscopy and found to have 3-4 cm sessile mass in distal transverse colon.  Had laparoscopic Rt hemicolectomy 9/05, and d/c home 9/12.  He developed N/V and abd pain, and found to have pneumoperitoneum 9/15 from anastomotic leak.  He had ileocolonic resection, ileostomy, and wound vac.  He remained on vent post-op, and PCCM consulted.  Events Since Admission: 02/16/2012 >>Laparotomy 02/26/2012>>return to OR for closure  SUBJECTIVE: Less agitated, ETT better position   Vital Signs: Temp:  [97 F (36.1 C)-98.8 F (37.1 C)] 97 F (36.1 C) (09/25 0840) Pulse Rate:  [47-59] 55  (09/25 0840) Resp:  [11-22] 16  (09/25 0840) BP: (84-133)/(45-80) 123/80 mmHg (09/25 0840) SpO2:  [96 %-100 %] 100 % (09/25 0800) FiO2 (%):  [29.8 %-30.6 %] 30.4 % (09/25 0800) Weight:  [112.3 kg (247 lb 9.2 oz)] 112.3 kg (247 lb 9.2 oz) (09/25 0700)  Physical Examination: General - intermittent agitation HEENT - ett in place but out too far Cardiac - s1s2 regular, no murmur Chest - no wheeze Abd - soft, mildly distended, ileostomy and wound vac in place Ext - no edema Neuro - agitated , shaking head to and fro    ASSESSMENT AND PLAN Principal Problem:  *Peritonitis Active Problems:  Type II diabetes mellitus, well controlled  OBSTRUCTIVE SLEEP APNEA  Obesity (BMI 30.0-34.9)  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 Hx of OSA. P:   SBT  in am 9/26   CARDIOVASCULAR  Lab 02/24/12 0844 02/24/12 0225 02/23/12 2040 02/21/12 0839 02/19/12 0930  TROPONINI <0.30 <0.30 <0.30 -- --  LATICACIDVEN -- -- -- -- 1.3  PROBNP -- -- -- 295.9* --   Rt IJ CVL 9/15>> A line 02/16/2012 >>02/19/12  A: Septic shock from peritonitis>>resolved.  I>>>O +18L Hx of HTN. P:  Start lasix   RENAL  Lab 02/26/12 0415 02/25/12 0508 02/24/12 0225 02/23/12 0400 02/22/12 0418 02/21/12 0400 02/20/12 0445  NA 142 138 137 138 141 -- --  K 3.4* 4.5 -- -- -- -- --  CL 120* 111 109 109 112 -- --  CO2 17* 21 21 22 21  -- --  BUN 14 20 27* 26* 29* -- --  CREATININE 0.74 0.97 1.18 1.23 1.35 -- --  CALCIUM 5.8* 7.2* 7.4* 7.7* 7.4* -- --  MG -- 1.7 1.8 1.9 -- 2.1 2.3  PHOS -- -- 3.6 3.5 3.5 3.1 2.6   Intake/Output      09/24 0701 - 09/25 0700 09/25 0701 - 09/26  0700   I.V. (mL/kg) 1972.3 (17.6) 250 (2.2)   Blood  150   NG/GT 120    IV Piggyback 775    TPN 2020    Total Intake(mL/kg) 4887.3 (43.5) 400 (3.6)   Urine (mL/kg/hr) 1645 (0.6)    Emesis/NG output 450    Drains 350    Stool 40    Total Output 2485    Net +2402.3 +400         Foley: 02/16/2012   A: Acute renal failure from shock>>improving.  Metabolic acidosis>>resolved. Hypokalemia Hypocalcemia>>corrects with low albumin   P Monitor BMET daily Replete K   GASTROINTESTINAL  Lab 02/25/12 0508 02/24/12 0225 02/23/12 0400  AST 36 36 36  ALT 25 18 18   ALKPHOS 97 77 87  BILITOT 1.3* 2.0* 2.1*  PROT 5.5* 5.3* 5.6*  ALBUMIN 1.2* 1.1* 1.3*    A: Acute peritonitis 2nd to anastomotic leak.   Unable to close  abdomen 9/22  ?return to OR soon?  Nutrition for severe protein calorie malnutrition  P:   Return to OR per [facility] 9/25 Continue TPN  HEMATOLOGIC  Lab 02/26/12 0415 02/25/12 0341 02/24/12 0225 02/23/12 0400 02/22/12 0418  HGB 7.1* 7.7* 8.9* 9.3* 9.9*  HCT 20.9* 23.1* 25.9* 26.5* 28.8*  PLT 294 273 324 294 286  INR -- -- -- -- --  APTT -- -- -- -- --   A: Anemia of critical illness. P:  F/u CBC Transfuse for Hb < /= 7>>for two units PRBC 02/26/2012 for OR  INFECTIOUS  Lab 02/26/12 0415 02/25/12 0341 02/24/12 0225 02/23/12 0400 02/22/12 0418 02/19/12 0930  WBC 8.6 8.8 13.7* 14.0* 14.3* --  PROCALCITON -- -- -- -- 2.55 8.20   Cultures: Urine 9/15>>negative Blood 9/18>>neg Urine 9/18>>negative Sputum 9/18>>Candida  Antibiotics: Rocephin 9/15>>9/15 Ertapenem 9/15>>9/17 Zosyn (abd leak, peritonitis) 9/18>> Vancomycin (abd leak, peritonitis)9/18>> Diflucan (peritonitis, abd leak) 9/20>>  A: Septic shock from peritonitis.  Procalcitonin trending down.  Recurrent fever 9/18 and 9/20>>improved with change in Abx and adding anti-fungal. P:   Abx as above  ENDOCRINE  Lab 02/26/12 0753 02/26/12 0352 02/26/12 0027 02/25/12 1950 02/25/12 1522  GLUCAP 86 102* 136* 153* 160*   A: DM type II Ok control P:   SSI  NEUROLOGIC  A: Sedation.  P:   Continue precedex and fentanyl gtt Versed, haldol prn  BEST PRACTICE / DISPOSITION Level of Care:  ICU Primary Service:  [facility] Consultants:  PCCM Code Status:  Full Diet:  TPN DVT Px:  SCD GI Px:  Protonix Social/family: daughter updated at bedside AM of 02/26/2012   Critical care time 35 minutes.  Mariel Sleet [place]  [phone]  Cell  [phone]  If no response or cell goes to voicemail, call beeper [phone]  02/26/2012, 8:53 AM

## 2012-02-26 NOTE — Brief Op Note (Signed)
02/16/2012 - 02/26/2012  10:13 AM  PATIENT:  [name]  61 y.o. male  PRE-OPERATIVE DIAGNOSIS:  SEPSIS, ANASTAMOTIC LEAK.  POST-OPERATIVE DIAGNOSIS:  SEPSIS, ANASTAMOTIC LEAK.  PROCEDURE:  Procedure(s) (LRB) with comments: EXPLORATORY LAPAROTOMY (N/A) -   APPLICATION OF WOUND VAC (N/A)  SURGEON:  Surgeon(s) and Role:    Imogene Burn. [name], MD - Primary  PHYSICIAN ASSISTANT:   ASSISTANTS: none   ANESTHESIA:   general  EBL:  Total I/O In: 696.7 [I.V.:370.8; Blood:150; NG/GT:30; IV Piggyback:12.5; TPN:133.3] Out: 450 [Urine:450]  BLOOD ADMINISTERED:375 CC PRBC  DRAINS: VAC to midline subcutaneous tissues   LOCAL MEDICATIONS USED:  NONE  SPECIMEN:  No Specimen  DISPOSITION OF SPECIMEN:  N/A  COUNTS:  YES  TOURNIQUET:  * No tourniquets in log *  DICTATION: .Dragon Dictation  PLAN OF CARE: Return to ICU  PATIENT DISPOSITION:  ICU - intubated and critically ill.   Delay start of Pharmacological VTE agent (>24hrs) due to surgical blood loss or risk of bleeding: no  Imogene Burn. [name], MD, [facility] Surgery  02/26/2012 10:14 AM

## 2012-02-26 NOTE — Op Note (Signed)
Pre-op diagnosis:  Open abdomen after intra-abdominal abscess, anastomotic leak, sepsis Post-op diagnosis:  Same Procedure:  Repeat exploratory laparotomy/ closure of abdominal wall/ placement of VAC Surgeon:  Leialoha Hanna K. Anesthesia:  GETT Indications:  61 yo male who is s/p right hemicolectomy for adenocarcinoma who presented with sepsis and was found to have an anastomotic leak on 02/16/12.  He has had an open abdomen for the last several days with multiple returns to the operating room for [facility] change and attempts at fascial closure.  The fascia was partially closed three days ago.  Description of procedure: The patient brought to the operating room and placed in a supine position on the operating room table. The previous VAC as well as the ostomy appliance was removed. The skin around the wound was prepped with Betadine and draped in sterile fashion. We isolated the ileostomy with a clean towel. The inner VAC sponge was removed. The small bowel is matted together centrally but appears viable and clean. The paracolic gutters were explored and had some thin serous fluid but no sign of purulence or other infection. We irrigated thoroughly. The patient's peak airway pressures prior to any fascial closure were 31. We then began reapproximating his fascia with multiple interrupted #1 Novafil sutures. We were able to slowly close his abdominal fascia. I monitored his peak airway pressures and the pressures never seemed to change.We were able to completely closed the fascia with no change in his airway pressures. We irrigated the subcutaneous tissues and cut a VAC sponge to fit the midline incision. The sponge was secured with an occlusive drape. This was placed to suction with good seal. We replaced the new ostomy appliance around the ileostomy. I inserted a finger into the ileostomy and dilated this down to the fascia. We then transported the patient back to the intensive care unit while still intubated. All  sponge, initially, and needle counts are correct.  Imogene Burn. [name], MD, [facility] Surgery  02/26/2012 10:52 AM

## 2012-02-27 ENCOUNTER — Encounter: Payer: Self-pay | Admitting: Internal Medicine

## 2012-02-27 ENCOUNTER — Inpatient Hospital Stay (HOSPITAL_COMMUNITY): Payer: Medicaid Other

## 2012-02-27 ENCOUNTER — Encounter (HOSPITAL_COMMUNITY): Payer: Self-pay | Admitting: Surgery

## 2012-02-27 LAB — CBC
HCT: 30.7 % — ABNORMAL LOW (ref 39.0–52.0)
Hemoglobin: 10.4 g/dL — ABNORMAL LOW (ref 13.0–17.0)
MCH: 25.4 pg — ABNORMAL LOW (ref 26.0–34.0)
MCHC: 33.9 g/dL (ref 30.0–36.0)
MCV: 75.1 fL — ABNORMAL LOW (ref 78.0–100.0)
Platelets: 327 10*3/uL (ref 150–400)
RBC: 4.09 MIL/uL — ABNORMAL LOW (ref 4.22–5.81)
RDW: 20.8 % — ABNORMAL HIGH (ref 11.5–15.5)
WBC: 15 10*3/uL — ABNORMAL HIGH (ref 4.0–10.5)

## 2012-02-27 LAB — TYPE AND SCREEN
ABO/RH(D): AB POS
Antibody Screen: NEGATIVE
Unit division: 0
Unit division: 0

## 2012-02-27 LAB — COMPREHENSIVE METABOLIC PANEL
ALT: 21 U/L (ref 0–53)
AST: 35 U/L (ref 0–37)
Albumin: 1.3 g/dL — ABNORMAL LOW (ref 3.5–5.2)
Alkaline Phosphatase: 111 U/L (ref 39–117)
BUN: 18 mg/dL (ref 6–23)
CO2: 22 mEq/L (ref 19–32)
Calcium: 7.8 mg/dL — ABNORMAL LOW (ref 8.4–10.5)
Chloride: 107 mEq/L (ref 96–112)
Creatinine, Ser: 0.9 mg/dL (ref 0.50–1.35)
GFR calc Af Amer: >90 mL/min (ref >90)
GFR calc non Af Amer: >90 mL/min (ref >90)
Glucose, Bld: 218 mg/dL — ABNORMAL HIGH (ref 70–99)
Potassium: 4.6 mEq/L (ref 3.5–5.1)
Sodium: 136 mEq/L (ref 135–145)
Total Bilirubin: 1.3 mg/dL — ABNORMAL HIGH (ref 0.3–1.2)
Total Protein: 6.1 g/dL (ref 6.0–8.3)

## 2012-02-27 LAB — MAGNESIUM: Magnesium: 1.3 mg/dL — ABNORMAL LOW (ref 1.5–2.5)

## 2012-02-27 LAB — GLUCOSE, CAPILLARY
Glucose-Capillary: 176 mg/dL — ABNORMAL HIGH (ref 70–99)
Glucose-Capillary: 191 mg/dL — ABNORMAL HIGH (ref 70–99)
Glucose-Capillary: 199 mg/dL — ABNORMAL HIGH (ref 70–99)
Glucose-Capillary: 204 mg/dL — ABNORMAL HIGH (ref 70–99)
Glucose-Capillary: 206 mg/dL — ABNORMAL HIGH (ref 70–99)
Glucose-Capillary: 212 mg/dL — ABNORMAL HIGH (ref 70–99)
Glucose-Capillary: 212 mg/dL — ABNORMAL HIGH (ref 70–99)

## 2012-02-27 LAB — PHOSPHORUS: Phosphorus: 3 mg/dL (ref 2.3–4.6)

## 2012-02-27 IMAGING — CR DG CHEST 1V PORT
1 series · 1 of 1 positions shown · non-contrast
Comparison: [DATE]; [DATE]; [DATE]

CLINICAL DATA: Evaluate endotracheal tube, atelectasis

PORTABLE CHEST - 1 VIEW

[AP]
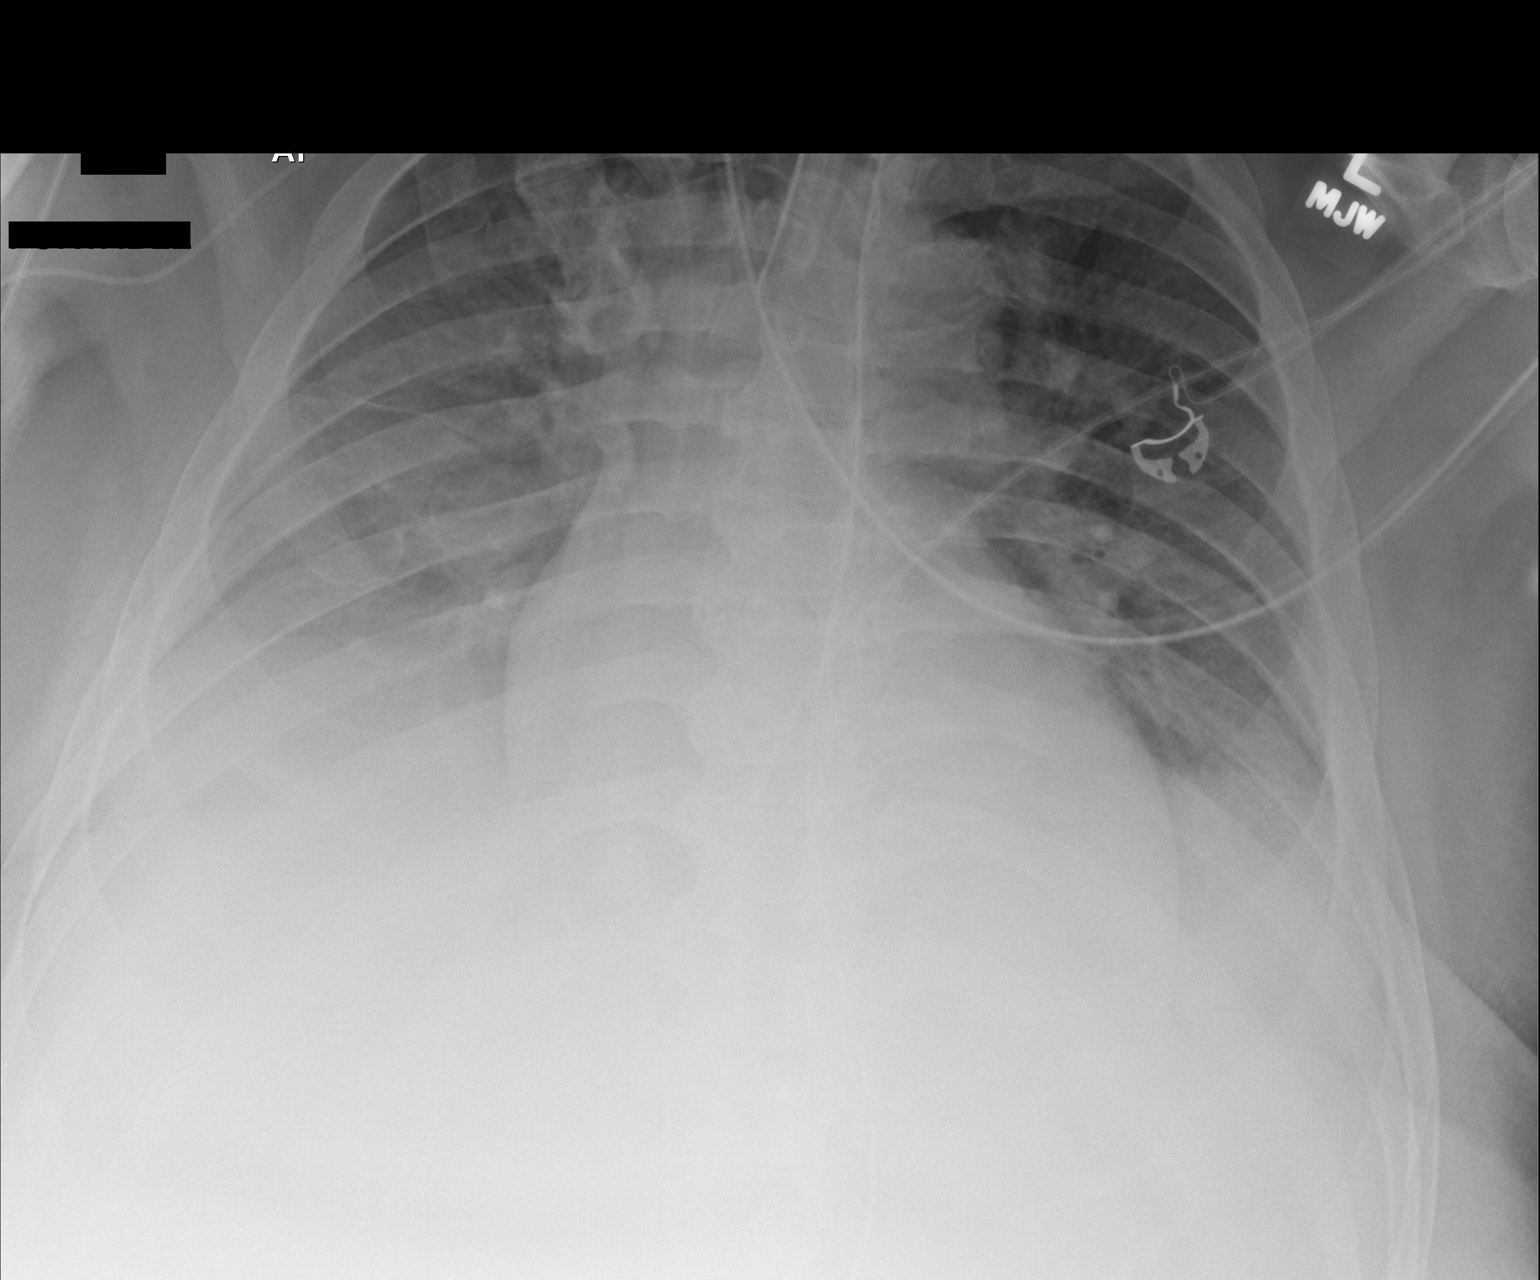

[1 of 1 positions shown; findings below may reference images not displayed]

FINDINGS: Grossly unchanged enlarged cardiac silhouette and
mediastinal contours.  Stable position of support apparatus.
Pulmonary vasculature remains indistinct.  Grossly unchanged
layering bilateral pleural effusions and basilar heterogeneous
opacities, right greater than left.  No definite pneumothorax.
Unchanged bones.
IMPRESSION: 1.  Stable positioning of support apparatus.  No pneumothorax.
2.  Grossly unchanged findings of pulmonary edema, layering
bilateral effusions and bibasilar opacities, right greater than
left, possibly atelectasis

## 2012-02-27 MED ORDER — INSULIN REGULAR HUMAN 100 UNIT/ML IJ SOLN
INTRAVENOUS | Status: AC
  Administered 2012-02-27: 18:00:00 via INTRAVENOUS
  Filled 2012-02-27: qty 2640

## 2012-02-27 MED ORDER — INSULIN GLARGINE 100 UNIT/ML ~~LOC~~ SOLN
15.0000 [IU] | Freq: Two times a day (BID) | SUBCUTANEOUS | Status: DC
  Administered 2012-02-27: 15 [IU] via SUBCUTANEOUS

## 2012-02-27 MED ORDER — JEVITY 1.2 CAL PO LIQD
1000.0000 mL | ORAL | Status: DC
  Administered 2012-02-27: 10 mL
  Filled 2012-02-27: qty 1000

## 2012-02-27 MED ORDER — FUROSEMIDE 10 MG/ML IJ SOLN
40.0000 mg | Freq: Four times a day (QID) | INTRAMUSCULAR | Status: AC
  Administered 2012-02-27 – 2012-02-28 (×4): 40 mg via INTRAVENOUS
  Filled 2012-02-27 (×4): qty 4

## 2012-02-27 MED ORDER — MAGNESIUM SULFATE 50 % IJ SOLN
3.0000 g | Freq: Once | INTRAVENOUS | Status: AC
  Administered 2012-02-27: 3 g via INTRAVENOUS
  Filled 2012-02-27: qty 6

## 2012-02-27 NOTE — Progress Notes (Signed)
PARENTERAL NUTRITION CONSULT NOTE - Follow up  Pharmacy Consult for TPN Indication: Prolonged ileus  No Known Allergies  Patient Measurements: Height: 5\' 11"  (180.3 cm) Weight: 247 lb 9.2 oz (112.3 kg) IBW/kg (Calculated) : 75.3  Ideal Body Weight: 80 Usual Weight: 117kg  Vital Signs: Temp: 97 F (36.1 C) (09/26 0803) Temp src: Axillary (09/26 0803) BP: 104/87 mmHg (09/26 0825) Pulse Rate: 56  (09/26 0825) Intake/Output from previous day: 09/25 0701 - 09/26 0700 In: [id number] [I.V.:2672.3; Blood:600; NG/GT:60; IV Piggyback:775.5; TPN:2559.3] Out: [id number] [Urine:6840; Emesis/NG output:100; Drains:175; Stool:30] Intake/Output from this shift: Total I/O In: 240 [TPN:240] Out: 325 [Urine:325]  Labs:  [facility] 02/27/12 0426 02/26/12 0415 02/25/12 0341  WBC 15.0* 8.6 8.8  HGB 10.4* 7.1* 7.7*  HCT 30.7* 20.9* 23.1*  PLT 327 294 273  APTT -- -- --  INR -- -- --     Basename 02/27/12 0426 02/26/12 0415 02/25/12 0508  NA 136 142 138  K 4.6 3.4* 4.5  CL 107 120* 111  CO2 22 17* 21  GLUCOSE 218* 103* 247*  BUN 18 14 20   CREATININE 0.90 0.74 0.97  LABCREA -- -- --  CREAT24HRUR -- -- --  CALCIUM 7.8* 5.8* 7.2*  MG 1.3* -- 1.7  PHOS 3.0 -- --  PROT 6.1 -- 5.5*  ALBUMIN 1.3* -- 1.2*  AST 35 -- 36  ALT 21 -- 25  ALKPHOS 111 -- 97  BILITOT 1.3* -- 1.3*  BILIDIR -- -- --  IBILI -- -- --  PREALBUMIN -- -- --  TRIG -- -- --  CHOLHDL -- -- --  CHOL -- -- --   Estimated Creatinine Clearance: 111.2 ml/min (by C-G formula based on Cr of 0.9).    Basename 02/27/12 0759 02/27/12 0409 02/26/12 2339  GLUCAP 191* 199* 176*     Insulin Requirements in the past 24 hours:  18 units SSI, 25 units in TPN  Current Nutrition:  Clinimix E 5/15 at 157mL/hr   Nutritional Goals:  2190 kCal, 140-150 grams of protein per day per RD   Assessment: 61 year old man underwent hemicolectomy on 9/5 2/2 stage II colon CA and was discharged on 9/12.  He was readmitted 9/14 and underwent  surgery for anastomatic leak and ileostomy on 9/15.  TPN initiated for prolonged ileus and prolonged fasting.   GI:  OR 9/22 for VAC change, edema precluded full closure of wound. 9/25 Abd fascia closed. Distended abd w hypoactive BS. 163mL output from neg pressure drain. 142mL bile output/24 from NGT- decreased. 64mL stool output/24 in ostomy. [facility] to start trickle tube feeds with Jevity at 10 cc/hr Endo:  Hx IDDM. CBGs [id number]. On resistant SSI, noted pt rec'd 15 units Lantus this AM Lytes:  Na 136, K 4.6, Mag 1.3, Phos 3.0, Corr Ca 9.96  Renal:  SCr 0.74 (down), CrCl > 100 ml/min.  UOP 2.5 mL/kg/hr (rec'd lasix 40mg  IV x 2 yesterday). [facility] at [facility] ID:  Tm 99, WBC 15 (both elevated after trip to OR 9/25). Vancomycin D9, fluconazole D7 and Zosyn D8 on board for peritonitis 2/2 anastomotic leak.  Has been on antibiotics since 9/15. Urine 9/15>>NG  Blood 9/18>>NG  Urine 9/18>>NG  Sputum 9/18>> few Candida  Heme/Onc:  H/H 10.4/30.7, rec'd 2 units PRBC 9/25.  Plts 327 (stable). SCD for VTE px. Stage II adenocarcinoma of the colon Pulm:  Hx OSA. On Vent, FiO2 30%. CXR sows LLL collapse/consolidations Cards:  Hx HTN, HLD. BPs low nml, P 50. CE (-)  x3. Sinus brady on EKG Hepatobil:  LFTs/Alk Phos wnl. Tbili slightly high at 1.3. Alb extremely low at 1.3 in the setting of ongoing inflammation with SIRS/sepsis/trips to OR. TG high at 198. Prealbumin very low at 4.0 with ongoing illness, expect this to increase with nutritional support Neuro:  Precedex @1 .2/kg/hr, [id number] 400/hr, Versed prn, pt has had periods of agitation since admit. GCS 13, RASS -1 (at goal)  Best Practices: SCDs, MC, PPI TPN Access: PICC triple lumen placed 9/22 TPN day#: 5  Plan:  - Continue Clinimix E 5/15 at 157mL/hr to promote healing now that abdomen is closed. Formula will provide 132 gm protein/d (94% of needs) and 2088 avg kcal/d (95% of needs) - Enteral feeds of Jevity 1.2 at 10 cc/hr will provide 288 kCal and 13 gm  protein. Will follow up gastric residuals to assess tolerance. - Increase insulin in TPN to 35 units to avoid continued hyperglycemia with TPN at goal rate - F/u CBGs tomorrow with addition of TFs - Multivitamins, trace elements, and lipids will be added on MWF only due to national shortage. - F/u with tube feeding advancement to assess when to wean TPN - Magnesium sulfate 3gm IV x 1 today  - F/u Mag, BMET in AM   Thank you for the consult.  Johny Drilling, PharmD Clinical Pharmacist Pager: [phone] Pharmacy: [phone] 02/27/2012 11:19 AM

## 2012-02-27 NOTE — Progress Notes (Addendum)
Name: [name] MRN: [id number] DOB: 1950/10/17    LOS: 11 days Date of admit: 02/16/2012 PCP is [name], MD   Referring Provider:  [name] of [facility] Reason for Referral:  Post op resp failure, metabolic acidosis and hypotension   PULMONARY / CRITICAL CARE MEDICINE  HPI: 61 yo male smoker s/p colonoscopy and found to have 3-4 cm sessile mass in distal transverse colon.  Had laparoscopic Rt hemicolectomy 9/05, and d/c home 9/12.  He developed N/V and abd pain, and found to have pneumoperitoneum 9/15 from anastomotic leak.  He had ileocolonic resection, ileostomy, and wound vac.  He remained on vent post-op, and PCCM consulted.  Events Since Admission: 02/16/2012 >>Laparotomy 02/27/2012>>return to OR for closure  SUBJECTIVE: S/p OR 9/25.  Push SBT/wean today.  Needs volume off.  Vital Signs: Temp:  [97 F (36.1 C)-99 F (37.2 C)] 98.5 F (36.9 C) (09/26 0400) Pulse Rate:  [50-65] 53  (09/26 0700) Resp:  [0-26] 15  (09/26 0700) BP: (106-169)/(50-95) 132/77 mmHg (09/26 0700) SpO2:  [98 %-100 %] 100 % (09/26 0700) FiO2 (%):  [29.8 %-30.4 %] 30.2 % (09/26 0700)  Physical Examination: General - intermittent agitation HEENT - ett in place but out too far Cardiac - s1s2 regular, no murmur Chest - no wheeze, distant Bs Abd - soft, mildly distended, ileostomy and wound vac in place Ext - 3+  edema Neuro - agitated , shaking head to and fro    ASSESSMENT AND PLAN Principal Problem:  *Peritonitis Active Problems:  Type II diabetes mellitus, well controlled  OBSTRUCTIVE SLEEP APNEA  Obesity (BMI 30.0-34.9)  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 Acute pulmonary edema and increased lung water. Hx of OSA. P:   SBT  9/26 Lasix   CARDIOVASCULAR  Lab 02/24/12 0844 02/24/12 0225 02/23/12 2040 02/21/12 0839  TROPONINI <0.30 <0.30 <0.30 --  LATICACIDVEN -- -- -- --  PROBNP -- -- -- 295.9*   Rt IJ CVL 9/15>> A line 02/16/2012 >>02/19/12  A: Septic shock from peritonitis>>resolved.  I>>>O +18L Hx of HTN. P:  Cont  lasix   RENAL  Lab 02/27/12 0426 02/26/12 0415 02/25/12 0508 02/24/12 0225 02/23/12 0400 02/22/12 0418 02/21/12 0400  NA 136 142 138 137 138 -- --  K 4.6 3.4* -- -- -- -- --  CL 107 120* 111 109 109 -- --  CO2 22 17* 21 21 22  -- --  BUN 18 14 20  27* 26* -- --  CREATININE 0.90 0.74 0.97 1.18 1.23 -- --  CALCIUM 7.8* 5.8* 7.2* 7.4* 7.7* -- --  MG 1.3* -- 1.7 1.8 1.9 -- 2.1  PHOS 3.0 -- -- 3.6 3.5 3.5 3.1   Intake/Output      09/25 0701 - 09/26 0700 09/26 0701 - 09/27 0700   I.V. (mL/kg) 2672.3 (23.8)  Blood 600    NG/GT 60    IV Piggyback 775.5    TPN 2559.3    Total Intake(mL/kg) 6667.2 (59.4)    Urine (mL/kg/hr) 6840 (2.5)    Emesis/NG output 100    Drains 175    Stool 30    Total Output 7145    Net -477.9          Foley: 02/16/2012   A: Acute renal failure from shock>>improving.  Metabolic acidosis>>resolved. Hypokalemia>>>repleted Hypocalcemia>>corrects with low albumin   P Monitor BMET daily Replete K   GASTROINTESTINAL  Lab 02/27/12 0426 02/25/12 0508 02/24/12 0225 02/23/12 0400  AST 35 36 36 36  ALT 21 25 18 18   ALKPHOS 111 97 77 87  BILITOT 1.3* 1.3* 2.0* 2.1*  PROT 6.1 5.5* 5.3* 5.6*  ALBUMIN 1.3* 1.2* 1.1* 1.3*    A: Acute peritonitis 2nd to anastomotic leak.   Unable to close abdomen  9/22  ?return to OR soon?  Nutrition for severe protein calorie malnutrition  P:   Return to OR per [facility] 9/25 Continue TPN  HEMATOLOGIC  Lab 02/27/12 0426 02/26/12 0415 02/25/12 0341 02/24/12 0225 02/23/12 0400  HGB 10.4* 7.1* 7.7* 8.9* 9.3*  HCT 30.7* 20.9* 23.1* 25.9* 26.5*  PLT 327 294 273 324 294  INR -- -- -- -- --  APTT -- -- -- -- --   A: Anemia of critical illness. P:  F/u CBC Transfuse for Hb < /= 7>>for two units PRBC 02/27/2012 for OR  INFECTIOUS  Lab 02/27/12 0426 02/26/12 0415 02/25/12 0341 02/24/12 0225 02/23/12 0400 02/22/12 0418  WBC 15.0* 8.6 8.8 13.7* 14.0* --  PROCALCITON -- -- -- -- -- 2.55   Cultures: Urine 9/15>>negative Blood 9/18>>neg Urine 9/18>>negative Sputum 9/18>>Candida  Antibiotics: Rocephin 9/15>>9/15 Ertapenem 9/15>>9/17 Zosyn (abd leak, peritonitis) 9/18>> Vancomycin (abd leak, peritonitis)9/18>> Diflucan (peritonitis, abd leak) 9/20>>  A: Septic shock from peritonitis.  Procalcitonin trending down.  Recurrent fever 9/18 and 9/20>>improved with change in Abx and adding anti-fungal. P:   Abx as above  ENDOCRINE  Lab 02/27/12 0409 02/26/12 2339 02/26/12 1918 02/26/12 1605 02/26/12 1153  GLUCAP 199* 176* 212* 198* 183*   A: DM type II, CBGs high on TPN P:   SSI Add lantus 15units bid.  NEUROLOGIC  A: Sedation.  P:   Continue precedex and fentanyl gtt>>>WUA Versed, haldol prn  BEST PRACTICE / DISPOSITION Level of Care:  ICU Primary Service:  CCS Consultants:  PCCM Code Status:  Full Diet:  TPN DVT Px:  SCD GI Px:  Protonix Social/family:no family present on AM rounds  Critical care time 35 minutes.  Mariel Sleet [place]  [phone]  Cell  [phone]  If no response or cell goes to voicemail, call beeper [phone]  02/27/2012, 7:32 AM

## 2012-02-27 NOTE — Progress Notes (Signed)
1 Day Post-Op  Subjective: Patient is intermittently agitated, on sedation, still intubated Abdomen closed yesterday - no significant change in airway pressures Hgb has responded appropriately to transfusion yesterday.  Objective: Vital signs in last 24 hours: Temp:  [97 F (36.1 C)-99 F (37.2 C)] 98.5 F (36.9 C) (09/26 0400) Pulse Rate:  [50-65] 50  (09/26 0600) Resp:  [0-26] 13  (09/26 0600) BP: (106-169)/(50-95) 127/60 mmHg (09/26 0600) SpO2:  [98 %-100 %] 100 % (09/26 0600) FiO2 (%):  [29.8 %-30.4 %] 29.8 % (09/26 0600) Last BM Date: 02/25/12 (ileostomy)  Intake/Output from previous day: 09/25 0701 - 09/26 0700 In: 6454.7 [I.V.:2579.8; Blood:600; NG/GT:60; IV Piggyback:775.5; TPN:2439.3] Out: [id number] [Urine:6840; Emesis/NG output:100; Drains:175; Stool:30] Intake/Output this shift:    Abd - slightly distended; VAC with some serosanguinous output - good seal Ileostomy - some mucus/ serous output   Lab Results:   Basename 02/27/12 0426 02/26/12 0415  WBC 15.0* 8.6  HGB 10.4* 7.1*  HCT 30.7* 20.9*  PLT 327 294   BMET  Basename 02/27/12 0426 02/26/12 0415  NA 136 142  K 4.6 3.4*  CL 107 120*  CO2 22 17*  GLUCOSE 218* 103*  BUN 18 14  CREATININE 0.90 0.74  CALCIUM 7.8* 5.8*   PT/INR No results found for this basename: LABPROT:2,INR:2 in the last 72 hours ABG No results found for this basename: PHART:2,PCO2:2,PO2:2,HCO3:2 in the last 72 hours  Studies/Results: Dg Chest Port 1 View  02/26/2012  *RADIOLOGY REPORT*  Clinical Data: Atelectasis.  PORTABLE CHEST - 1 VIEW  Comparison: 02/25/2012.  Findings: Endotracheal tube is in satisfactory position.  The nasogastric tube is followed into the stomach with the tip projecting beyond the inferior boundary the film.  Right PICC tip projects over the SVC.  Heart size stable.  Diffuse bilateral air space disease and bilateral pleural effusions persist. Left lower lobe collapse/consolidation.  IMPRESSION: Diffuse bilateral  air space disease, bilateral pleural effusions and left lower lobe collapse/consolidation persist.   Original Report Authenticated By: Luretha Rued, M.D.    Dg Chest Port 1 View  02/25/2012  *RADIOLOGY REPORT*  Clinical Data: Endotracheal tube repositioning.  PORTABLE CHEST - 1 VIEW  Comparison: Earlier the same date and 02/24/2012.  Findings: 0907 hours.  The carina is not well defined.  The endotracheal tube has been slightly advanced, now approximately 3.4 cm above the expected location of the carina.  Nasogastric tube and right arm PICC are unchanged.  The heart size and mediastinal contours are stable.  There are stable basilar opacities and probable bilateral pleural effusions.  No pneumothorax is seen.  IMPRESSION: Slight advancement of the endotracheal tube within the mid trachea. No significant change in basilar airspace opacities and pleural effusions.   Original Report Authenticated By: Vivia Ewing, M.D.     Anti-infectives: Anti-infectives     Start     Dose/Rate Route Frequency Ordered Stop   02/26/12 0000   vancomycin (VANCOCIN) 1,250 mg in sodium chloride 0.9 % 250 mL IVPB        1,250 mg 166.7 mL/hr over 90 Minutes Intravenous Every 12 hours 02/25/12 1241     02/22/12 0000   vancomycin (VANCOCIN) IVPB 1000 mg/200 mL premix  Status:  Discontinued        1,000 mg 200 mL/hr over 60 Minutes Intravenous Every 12 hours 02/21/12 0929 02/25/12 1241   02/21/12 1000   fluconazole (DIFLUCAN) IVPB 400 mg        400 mg 200 mL/hr over  60 Minutes Intravenous Every 24 hours 02/21/12 0929     02/20/12 1000   vancomycin (VANCOCIN) 1,500 mg in sodium chloride 0.9 % 500 mL IVPB        1,500 mg 250 mL/hr over 120 Minutes Intravenous Every 24 hours 02/19/12 0942 02/21/12 1246   02/19/12 1000   piperacillin-tazobactam (ZOSYN) IVPB 3.375 g        3.375 g 12.5 mL/hr over 240 Minutes Intravenous 3 times per day 02/19/12 0942     02/19/12 1000   vancomycin (VANCOCIN) 2,000 mg in sodium  chloride 0.9 % 500 mL IVPB        2,000 mg 250 mL/hr over 120 Minutes Intravenous  Once 02/19/12 0942 02/19/12 1335   02/16/12 1615   ertapenem (INVANZ) 1 g in sodium chloride 0.9 % 50 mL IVPB        1 g 100 mL/hr over 30 Minutes Intravenous To Surgery 02/16/12 1606 02/16/12 1733   02/16/12 1600   ertapenem (INVANZ) 1 g in sodium chloride 0.9 % 50 mL IVPB  Status:  Discontinued        1 g 100 mL/hr over 30 Minutes Intravenous Every 24 hours 02/16/12 1508 02/19/12 0909   02/16/12 1415   cefTRIAXone (ROCEPHIN) 1 g in dextrose 5 % 50 mL IVPB        1 g 100 mL/hr over 30 Minutes Intravenous  Once 02/16/12 1407 02/16/12 1448          Assessment/Plan: s/p Procedure(s) (LRB) with comments: EXPLORATORY LAPAROTOMY (N/A) -   APPLICATION OF WOUND VAC (N/A) Vent wean per CCM May begin trickle tube feeds Change wound VAC at bedside tomorrow per nursing   LOS: 11 days    Rachael Ferrie K. 02/27/2012

## 2012-02-27 NOTE — Progress Notes (Signed)
ANTIBIOTIC CONSULT NOTE - FOLLOW UP  Pharmacy Consult for Vancomycin, Zosyn, Fluconazole Indication: suspected HCAP, peritonitis/abdominal sepsis  No Known Allergies  Patient Measurements: Height: 5\' 11"  (180.3 cm) Weight: 247 lb 9.2 oz (112.3 kg) IBW/kg (Calculated) : 75.3   Vital Signs: Temp: 97 F (36.1 C) (09/26 0803) Temp src: Axillary (09/26 0803) BP: 104/87 mmHg (09/26 0825) Pulse Rate: 56  (09/26 0825) Intake/Output from previous day: 09/25 0701 - 09/26 0700 In: [id number] [I.V.:2672.3; Blood:600; NG/GT:60; IV Piggyback:775.5; TPN:2559.3] Out: [id number] [Urine:6840; Emesis/NG output:100; Drains:175; Stool:30] Intake/Output from this shift:    Labs:  Basename 02/27/12 0426 02/26/12 0415 02/25/12 0508 02/25/12 0341  WBC 15.0* 8.6 -- 8.8  HGB 10.4* 7.1* -- 7.7*  PLT 327 294 -- 273  LABCREA -- -- -- --  CREATININE 0.90 0.74 0.97 --   Estimated Creatinine Clearance: 111.2 ml/min (by C-G formula based on Cr of 0.9).  Basename 02/24/12 1145  VANCOTROUGH 14.6  VANCOPEAK --  VANCORANDOM --  GENTTROUGH --  GENTPEAK --  GENTRANDOM --  TOBRATROUGH --  TOBRAPEAK --  TOBRARND --  AMIKACINPEAK --  AMIKACINTROU --  AMIKACIN --     Assessment: Admit Complaint: 61 yo M admitted 9/15 for emergent exp lap which found anastomotic leak and required resection of anastomosis, ileostomy, VAC placement.  Anticoagulation: None PTA  Infectious Disease: Vanc and Zosyn Day 9 (12 days total abx) for suspected HCAP/peritonitis and added fluconazole for possible abd sepsis. WBC 15 (trending up). Afeb. Cx negative except for few yeast in trach asp. ARF has resolved. Vanc trough was 14.6 on 9/23 - just slightly below goal, dose changed to 1250 Q12h since renal function continued to improve.  Zosyn 9/18>> Vanco 9/18>> Invanz 9/15>>9/18 Diflucan 9/20 >>  9/18 Urine cx- Neg 9/18 Bld x 2- Neg 9/18 Trach asp cx- few yeast 9/15 Urine cx neg  Goal of Therapy:  Vancomycin trough level 15-20  mcg/ml  Plan:  1. Continue Zosyn 3.375gm IV q8h. Each dose over 4 hours 2. Continue Vancomycin 1250g q12h  3. Continue Fluconazole 400mg  IV q24h 4. Will f/u microbiological data, renal function, clinical condition 5. ?LOT with antibiotics  Sherlon Handing, PharmD, BCPS Clinical pharmacist, pager [phone] 02/27/2012   8:43 AM

## 2012-02-28 ENCOUNTER — Inpatient Hospital Stay (HOSPITAL_COMMUNITY): Payer: Medicaid Other

## 2012-02-28 LAB — CBC
HCT: 30.8 % — ABNORMAL LOW (ref 39.0–52.0)
Hemoglobin: 10.7 g/dL — ABNORMAL LOW (ref 13.0–17.0)
MCH: 25.8 pg — ABNORMAL LOW (ref 26.0–34.0)
MCHC: 34.7 g/dL (ref 30.0–36.0)
MCV: 74.2 fL — ABNORMAL LOW (ref 78.0–100.0)
Platelets: 351 10*3/uL (ref 150–400)
RBC: 4.15 MIL/uL — ABNORMAL LOW (ref 4.22–5.81)
RDW: 20.6 % — ABNORMAL HIGH (ref 11.5–15.5)
WBC: 11.9 10*3/uL — ABNORMAL HIGH (ref 4.0–10.5)

## 2012-02-28 LAB — GLUCOSE, CAPILLARY
Glucose-Capillary: 292 mg/dL — ABNORMAL HIGH (ref 70–99)
Glucose-Capillary: 301 mg/dL — ABNORMAL HIGH (ref 70–99)
Glucose-Capillary: 192 mg/dL — ABNORMAL HIGH (ref 70–99)
Glucose-Capillary: 117 mg/dL — ABNORMAL HIGH (ref 70–99)
Glucose-Capillary: 112 mg/dL — ABNORMAL HIGH (ref 70–99)
Glucose-Capillary: 116 mg/dL — ABNORMAL HIGH (ref 70–99)

## 2012-02-28 LAB — BASIC METABOLIC PANEL
BUN: 19 mg/dL (ref 6–23)
BUN: 20 mg/dL (ref 6–23)
BUN: 21 mg/dL (ref 6–23)
CO2: 28 mEq/L (ref 19–32)
CO2: 26 mEq/L (ref 19–32)
CO2: 28 mEq/L (ref 19–32)
Calcium: 8.4 mg/dL (ref 8.4–10.5)
Calcium: 8.4 mg/dL (ref 8.4–10.5)
Calcium: 8.3 mg/dL — ABNORMAL LOW (ref 8.4–10.5)
Chloride: 103 mEq/L (ref 96–112)
Chloride: 94 mEq/L — ABNORMAL LOW (ref 96–112)
Chloride: 99 mEq/L (ref 96–112)
Creatinine, Ser: 0.91 mg/dL (ref 0.50–1.35)
Creatinine, Ser: 0.92 mg/dL (ref 0.50–1.35)
Creatinine, Ser: 0.98 mg/dL (ref 0.50–1.35)
GFR calc Af Amer: >90 mL/min (ref >90)
GFR calc Af Amer: >90 mL/min (ref >90)
GFR calc Af Amer: >90 mL/min (ref >90)
GFR calc non Af Amer: >90 mL/min (ref >90)
GFR calc non Af Amer: 90 mL/min — ABNORMAL LOW (ref >90)
GFR calc non Af Amer: 88 mL/min — ABNORMAL LOW (ref >90)
Glucose, Bld: 302 mg/dL — ABNORMAL HIGH (ref 70–99)
Glucose, Bld: 696 mg/dL (ref 70–99)
Glucose, Bld: 128 mg/dL — ABNORMAL HIGH (ref 70–99)
Potassium: 4 mEq/L (ref 3.5–5.1)
Potassium: 5.4 mEq/L — ABNORMAL HIGH (ref 3.5–5.1)
Potassium: 4 mEq/L (ref 3.5–5.1)
Sodium: 137 mEq/L (ref 135–145)
Sodium: 129 mEq/L — ABNORMAL LOW (ref 135–145)
Sodium: 135 mEq/L (ref 135–145)

## 2012-02-28 LAB — MAGNESIUM
Magnesium: 1.2 mg/dL — ABNORMAL LOW (ref 1.5–2.5)
Magnesium: 1.6 mg/dL (ref 1.5–2.5)

## 2012-02-28 LAB — VANCOMYCIN, TROUGH: Vancomycin Tr: 8.5 ug/mL — ABNORMAL LOW (ref 10.0–20.0)

## 2012-02-28 IMAGING — CR DG CHEST 1V PORT
1 series · 1 of 1 positions shown · non-contrast
Comparison: [DATE]

CLINICAL DATA: Pulmonary edema

PORTABLE CHEST - 1 VIEW

[AP]
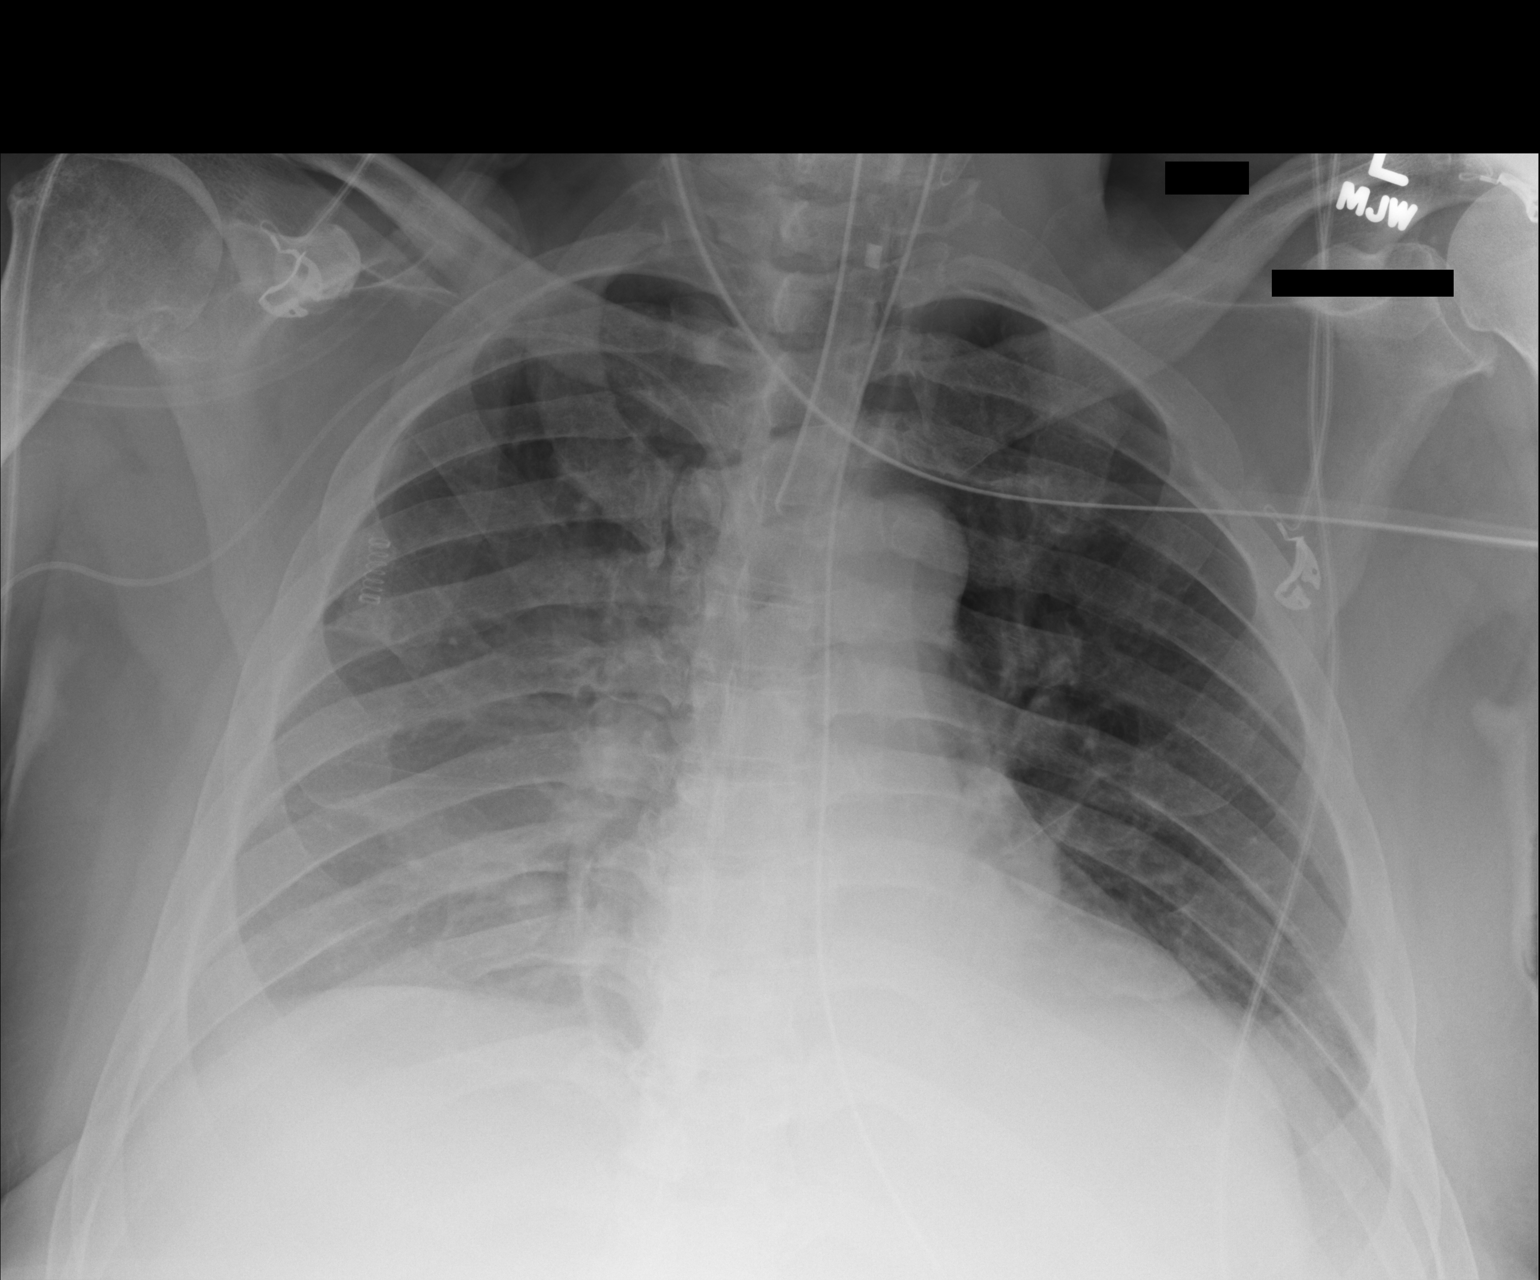

[1 of 1 positions shown; findings below may reference images not displayed]

FINDINGS: Endotracheal tube, NG tube,   right hip are stable.  Hazy
opacity at both lung bases right greater than left has improved.
No pneumothorax.
IMPRESSION: Improved bilateral pleural effusions and bibasilar volume loss
right greater than left.

## 2012-02-28 MED ORDER — FAT EMULSION 20 % IV EMUL
250.0000 mL | INTRAVENOUS | Status: AC
  Administered 2012-02-28: 250 mL via INTRAVENOUS
  Filled 2012-02-28: qty 250

## 2012-02-28 MED ORDER — MAGNESIUM SULFATE 40 MG/ML IJ SOLN
2.0000 g | Freq: Once | INTRAMUSCULAR | Status: AC
  Administered 2012-02-28: 2 g via INTRAVENOUS
  Filled 2012-02-28: qty 50

## 2012-02-28 MED ORDER — HALOPERIDOL LACTATE 5 MG/ML IJ SOLN: 5.0000 mg | Freq: Once | INTRAMUSCULAR | Status: AC

## 2012-02-28 MED ORDER — MAGNESIUM SULFATE IN D5W 10-5 MG/ML-% IV SOLN
1.0000 g | Freq: Once | INTRAVENOUS | Status: AC
  Administered 2012-02-28: 1 g via INTRAVENOUS
  Filled 2012-02-28: qty 100

## 2012-02-28 MED ORDER — FUROSEMIDE 10 MG/ML IJ SOLN
40.0000 mg | Freq: Once | INTRAMUSCULAR | Status: AC
  Administered 2012-02-28: 40 mg via INTRAVENOUS
  Filled 2012-02-28: qty 4

## 2012-02-28 MED ORDER — ZINC TRACE METAL 1 MG/ML IV SOLN
INTRAVENOUS | Status: AC
  Administered 2012-02-28: 18:00:00 via INTRAVENOUS
  Filled 2012-02-28: qty 2640

## 2012-02-28 MED ORDER — FENTANYL CITRATE 0.05 MG/ML IJ SOLN
50.0000 ug | INTRAMUSCULAR | Status: DC | PRN
  Administered 2012-02-28 (×5): 100 ug via INTRAVENOUS
  Administered 2012-02-29: 50 ug via INTRAVENOUS
  Administered 2012-02-29: 100 ug via INTRAVENOUS
  Administered 2012-03-01 (×3): 50 ug via INTRAVENOUS
  Administered 2012-03-02 – 2012-03-03 (×2): 100 ug via INTRAVENOUS
  Administered 2012-03-03 – 2012-03-09 (×3): 50 ug via INTRAVENOUS
  Filled 2012-02-28 (×14): qty 2

## 2012-02-28 MED ORDER — HALOPERIDOL LACTATE 5 MG/ML IJ SOLN
5.0000 mg | INTRAMUSCULAR | Status: DC | PRN
  Administered 2012-02-28 (×3): 5 mg via INTRAVENOUS
  Filled 2012-02-28 (×5): qty 1

## 2012-02-28 NOTE — Progress Notes (Signed)
Patient extremely agitated after daughter Joseph Art left thinking that we were going to have her arrested.  Katha Cabal NP at bedside.  Unable to calm patient down by music or by talking him down.  Dr. Joya Gaskins in unit and ordered haldol 5mg  IVP.  Haldol given as per orders and music placed on in room.  Will continue to monitor patient closely.

## 2012-02-28 NOTE — Progress Notes (Signed)
Nutrition Follow-up  Intervention:  1. Will monitor SLP and diet advancements.  2. TNA per pharmacy, recommend continue to meet > 90% of estimated energy needs.  3. RD to follow for nutrition plan of care.   Assessment:   Patient now extubated. Per RN patient doing well with ice chips. Per RN plans for SLP and once diet advancements monitor PO intake for wean of TNA. Patient with wound VAC. Noted pt diuresed 11L 9/26. Noted patient with acute pulmonary edema. Patient currently receiving Clinimix E 5/15 @ 110 ml/hr plus lipids 20% 10 ml/hr MWF. Weekly average of TNA provides 2080 kcal and 132 grams of protein (Meets > 90% of estimated energy and protein needs.) Per RN TF of Jevity 1.2 was tried yesterday, but discontinued for extubation.    Diet Order:  NPO/ ice chips, Clinimix E 5/15 @ 110 ml/hr plus lipids 20% 10 ml/hr MWF.   Meds: Scheduled Meds:   . antiseptic oral rinse  15 mL Mouth Rinse QID  . chlorhexidine  15 mL Mouth/Throat BID  . fluconazole (DIFLUCAN) IV  400 mg Intravenous Q24H  . furosemide  40 mg Intravenous Q6H  . furosemide  40 mg Intravenous Once  . haloperidol lactate  5 mg Intravenous Once  . insulin aspart  0-20 Units Subcutaneous Q4H  . magnesium sulfate 1 - 4 g bolus IVPB  1 g Intravenous Once  . magnesium sulfate 1 - 4 g bolus IVPB  2 g Intravenous Once  . pantoprazole (PROTONIX) IV  40 mg Intravenous QHS  . piperacillin-tazobactam (ZOSYN)  IV  3.375 g Intravenous Q8H  . sodium chloride  10-40 mL Intracatheter Q12H  . vancomycin  1,250 mg Intravenous Q12H   Continuous Infusions:   . sodium chloride 20 mL/hr at 02/28/12 0200  . dexmedetomidine Stopped (02/28/12 0945)  . fat emulsion Stopped (02/27/12 1800)  . fat emulsion    . feeding supplement (JEVITY 1.2 CAL) 10 mL (02/27/12 1221)  . TPN (CLINIMIX) +/- additives 110 mL/hr at 02/27/12 1200  . TPN (CLINIMIX) +/- additives 110 mL/hr at 02/27/12 1739  . TPN (CLINIMIX) +/- additives    . DISCONTD: fentaNYL  infusion INTRAVENOUS 400 mcg/hr (02/28/12 0400)   PRN Meds:.fentaNYL, haloperidol lactate, sodium chloride, DISCONTD: fentaNYL, DISCONTD: haloperidol lactate, DISCONTD:  HYDROmorphone (DILAUDID) injection, DISCONTD: meperidine (DEMEROL) injection, DISCONTD: midazolam  Labs:  CMP     Component Value Date/Time   NA 137 02/28/2012 0400   K 4.0 02/28/2012 0400   CL 103 02/28/2012 0400   CO2 28 02/28/2012 0400   GLUCOSE 302* 02/28/2012 0400   BUN 19 02/28/2012 0400   CREATININE 0.91 02/28/2012 0400   CREATININE 1.12 07/04/2011 1516   CALCIUM 8.4 02/28/2012 0400   PROT 6.1 02/27/2012 0426   ALBUMIN 1.3* 02/27/2012 0426   AST 35 02/27/2012 0426   ALT 21 02/27/2012 0426   ALKPHOS 111 02/27/2012 0426   BILITOT 1.3* 02/27/2012 0426   GFRNONAA >90 02/28/2012 0400   GFRAA >90 02/28/2012 0400     Intake/Output Summary (Last 24 hours) at 02/28/12 1344 Last data filed at 02/28/12 1300  Gross per 24 hour  Intake 5986.26 ml  Output  11025 ml  Net -5038.74 ml   Wt Readings from Last 10 Encounters:  02/28/12 241 lb 2.9 oz (109.4 kg)  02/28/12 241 lb 2.9 oz (109.4 kg)  02/28/12 241 lb 2.9 oz (109.4 kg)  02/28/12 241 lb 2.9 oz (109.4 kg)  02/28/12 241 lb 2.9 oz (109.4 kg)  02/28/12 241  lb 2.9 oz (109.4 kg)  02/13/12 265 lb 6.9 oz (120.4 kg)  02/13/12 265 lb 6.9 oz (120.4 kg)  01/30/12 258 lb 14.4 oz (117.436 kg)  01/13/12 258 lb 1.6 oz (117.073 kg)    Weight Status:  241 lb weight stable from weight of 243 lb on 9/24. Weight fluctuations likely partially due to changes in fluid status.   Re-estimated needs:  1900-2000 kcal, 120-142 grams protein  Nutrition Dx:  Inadequate oral intake, Ongoing.   Goal:  TPN to meet >90% of estimated protein needs, maximize energy provision as able during national lipid backorder. Meeting, continue.    Monitor:  TNA per pharmacy, diet advancements/ tolerance, SLP evaluation, weights, labs   [place] Gum [id number]

## 2012-02-28 NOTE — Progress Notes (Signed)
ANTIBIOTIC CONSULT NOTE - FOLLOW UP  Pharmacy Consult for Vancomycin, Zosyn, Fluconazole Indication: suspected HCAP, peritonitis/abdominal sepsis  No Known Allergies  Patient Measurements: Height: 5\' 11"  (180.3 cm) Weight: 241 lb 2.9 oz (109.4 kg) IBW/kg (Calculated) : 75.3   Vital Signs: Temp: 97.2 F (36.2 C) (09/27 0346) Temp src: Oral (09/27 0346) BP: 131/68 mmHg (09/27 0500) Pulse Rate: 46  (09/27 0500) Intake/Output from previous day: 09/26 0701 - 09/27 0700 In: 5012.1 [I.V.:1946.1; NG/GT:200; IV Piggyback:666; TPN:2200] Out: [id number] [Urine:10875; Drains:150] Intake/Output from this shift: Total I/O In: 2447.1 [I.V.:841.1; NG/GT:190; IV Piggyback:316; TPN:1100] Out: 5975 [Urine:5825; Drains:150]  Labs:  [facility] 02/28/12 0400 02/27/12 0426 02/26/12 0415  WBC 11.9* 15.0* 8.6  HGB 10.7* 10.4* 7.1*  PLT 351 327 294  LABCREA -- -- --  CREATININE 0.91 0.90 0.74   Estimated Creatinine Clearance: 108.5 ml/min (by C-G formula based on Cr of 0.91).  Basename 02/28/12 0330  VANCOTROUGH 8.5*  VANCOPEAK --  Jake Michaelis --  GENTTROUGH --  GENTPEAK --  GENTRANDOM --  TOBRATROUGH --  TOBRAPEAK --  TOBRARND --  AMIKACINPEAK --  AMIKACINTROU --  AMIKACIN --     Assessment: Admit Complaint: 61 yo M admitted 9/15 for emergent exp lap which found anastomotic leak and required resection of anastomosis, ileostomy, VAC placement.   Vanc and Zosyn Day 10 (13 days total abx) for suspected HCAP/peritonitis and added fluconazole for possible abd sepsis. WBC 11. Afeb. Cx negative except for few yeast in trach asp. ARF has resolved. Vanc trough was 14.6 on 9/23 - just slightly below goal, dose changed to 1250 Q12h since renal function continued to improve. Vanc trough obtained today =8.5 but 1600 dose not given 9.26 so not actual trough. Will attempt to recheck again after 3 or 4 doses if continued.  Zosyn 9/18>> Vanco 9/18>> Invanz 9/15>>9/18 Diflucan 9/20 >>  9/18 Urine cx-  Neg 9/18 Bld x 2- Neg 9/18 Trach asp cx- few yeast 9/15 Urine cx neg  Goal of Therapy:  Vancomycin trough level 15-20 mcg/ml  Plan:  Continue vancomycin 1250mg  q12h and recheck in 48 hours.  Davonna Belling, PharmD, BCPS Pager [phone] 02/28/2012   5:41 AM

## 2012-02-28 NOTE — Progress Notes (Signed)
PARENTERAL NUTRITION CONSULT NOTE - Follow up  Pharmacy Consult for TPN Indication: Prolonged ileus  No Known Allergies  Patient Measurements: Height: 5\' 11"  (180.3 cm) Weight: 241 lb 2.9 oz (109.4 kg) IBW/kg (Calculated) : 75.3  Ideal Body Weight: 80 Usual Weight: 117kg  Vital Signs: Temp: 97 F (36.1 C) (09/27 0749) Temp src: Axillary (09/27 0749) BP: 133/82 mmHg (09/27 1000) Pulse Rate: 57  (09/27 1000) Intake/Output from previous day: 09/26 0701 - 09/27 0700 In: 5526.3 [I.V.:2120.3; NG/GT:220; IV Piggyback:766; TPN:2420] Out: [id number] [Urine:11200; Drains:150] Intake/Output from this shift: Total I/O In: 410 [I.V.:20; NG/GT:60; TPN:330] Out: 650 [Urine:650]  Labs:  [facility] 02/28/12 0400 02/27/12 0426 02/26/12 0415  WBC 11.9* 15.0* 8.6  HGB 10.7* 10.4* 7.1*  HCT 30.8* 30.7* 20.9*  PLT 351 327 294  APTT -- -- --  INR -- -- --     Basename 02/28/12 0400 02/27/12 0426 02/26/12 0415  NA 137 136 142  K 4.0 4.6 3.4*  CL 103 107 120*  CO2 28 22 17*  GLUCOSE 302* 218* 103*  BUN 19 18 14   CREATININE 0.91 0.90 0.74  LABCREA -- -- --  CREAT24HRUR -- -- --  CALCIUM 8.4 7.8* 5.8*  MG 1.2* 1.3* --  PHOS -- 3.0 --  PROT -- 6.1 --  ALBUMIN -- 1.3* --  AST -- 35 --  ALT -- 21 --  ALKPHOS -- 111 --  BILITOT -- 1.3* --  BILIDIR -- -- --  IBILI -- -- --  PREALBUMIN -- -- --  TRIG -- -- --  CHOLHDL -- -- --  CHOL -- -- --   Estimated Creatinine Clearance: 108.5 ml/min (by C-G formula based on Cr of 0.91).    Basename 02/28/12 0741 02/28/12 0343 02/28/12 0020  GLUCAP 192* 301* 292*     Insulin Requirements in the past 24 hours:  37 units SSI, 35 units in TPN  Current Nutrition:  Clinimix E 5/15 at 111mL/hr Jevity 1.2 at 10 cc/hr   Nutritional Goals:  2190 kCal, 140-150 grams of protein per day per RD   Assessment: 61 year old man underwent hemicolectomy on 9/5 2/2 stage II colon CA and was discharged on 9/12.  He was readmitted 9/14 and underwent  surgery for anastomatic leak and ileostomy on 9/15.  TPN initiated for prolonged ileus and prolonged fasting.   GI:  9/25 Abd fascia closed. Distended abd w hypoactive BS. 161mL output from neg pressure drain. No output/24 from NGT- decreased. No stool output/24 in ostomy. PPI IV. TFs stopped this AM with extubation, hope to start PO diet in next day or 2 Endo:  Hx IDDM. CBGs 192-301. On resistant SSI. Expect CBGs to decrease with TFs off Lytes:  Na 137, K 4, Mag 1.2 (MagSO4 3gm ordered) Renal:  SCr 0.91 (up), CrCl > 100 ml/min.  UOP 4.3 mL/kg/hr (rec'd lasix 40mg  IV x 3 yesterday). [facility] at [facility] ID:  Afebrile, WBC 11.9 (down). Vancomycin D10, fluconazole D8 and Zosyn D10 on board for peritonitis 2/2 anastomotic leak.  Has been on antibiotics since 9/15. Urine 9/15>>NG  Blood 9/18>>NG  Urine 9/18>>NG  Sputum 9/18>> few Candida  Heme/Onc:  H/H 10.7/30.8, plts 351 (stable). SCD for VTE px. Stage II adenocarcinoma of the colon Pulm:  Hx OSA. Extubated 9/27 to 5L [place]  Cards:  Hx HTN, HLD. BPs nml, P 46-61. CE (-) x3. Sinus brady on EKG Hepatobil:  LFTs/Alk Phos wnl. Tbili slightly high at 1.3. Alb extremely low at 1.3 in the  setting of ongoing inflammation with SIRS/sepsis/trips to OR. TG high at 198. Prealbumin very low at 4.0 with ongoing illness, expect this to increase with nutritional support Neuro:  Off precedex this AM with extubation, still have Fent/Versed prn, pt has had periods of agitation since admit. GCS 13, RASS 1 (goal 0)  Best Practices: SCDs, MC, PPI TPN Access: PICC triple lumen placed 9/22 TPN day#: 6  Plan:  - Continue Clinimix E 5/15 at 162mL/hr to promote healing now that abdomen is closed. Formula will provide 132 gm protein/d (94% of needs) and 2088 avg kcal/d (95% of needs) - Continue insulin in TPN at 35 units, will hold off on increasing insulin with the d/c of Jevity as to not cause hypoglycemia - Multivitamins, trace elements, and lipids will be added on MWF only  due to national shortage. - F/u Mag, BMET in AM   Thank you for the consult.  Johny Drilling, PharmD Clinical Pharmacist Pager: [phone] Pharmacy: [phone] 02/28/2012 1:02 PM

## 2012-02-28 NOTE — Progress Notes (Signed)
Name: [name] MRN: [id number] DOB: Oct 28, 1950    LOS: 12 days Date of admit: 02/16/2012 PCP is [name], MD   Referring Provider:  [name] of [facility] Reason for Referral:  Post op resp failure, metabolic acidosis and hypotension   PULMONARY / CRITICAL CARE MEDICINE  HPI: 61 yo male smoker s/p colonoscopy and found to have 3-4 cm sessile mass in distal transverse colon.  Had laparoscopic Rt hemicolectomy 9/05, and d/c home 9/12.  He developed N/V and abd pain, and found to have pneumoperitoneum 9/15 from anastomotic leak.  He had ileocolonic resection, ileostomy, and wound vac.  He remained on vent post-op, and PCCM consulted.  Events Since Admission: 02/16/2012 >>Laparotomy 02/28/2012>>return to OR for closure  SUBJECTIVE: Pt much calmer. Diuresed 11L 9/26 Passed SBT   Vital Signs: Temp:  [97 F (36.1 C)-98.4 F (36.9 C)] 97 F (36.1 C) (09/27 0749) Pulse Rate:  [44-62] 54  (09/27 0828) Resp:  [3-32] 24  (09/27 0828) BP: (107-170)/(56-100) 138/56 mmHg (09/27 0828) SpO2:  [96 %-100 %] 100 % (09/27 0828) FiO2 (%):  [29.1 %-30.3 %] 30 % (09/27 0828) Weight:  [109.4 kg (241 lb 2.9 oz)] 109.4 kg (241 lb 2.9 oz) (09/27 0500)  Physical Examination: General - calm , listening to jazz music HEENT - ett in place  Cardiac - s1s2 regular, no murmur Chest - no wheeze, distant Bs Abd - soft, mildly distended, ileostomy and wound vac in place Ext - 1+  edema Neuro -much calmer, listening to jazz    ASSESSMENT AND PLAN Principal Problem:  *Peritonitis Active Problems:  Type II diabetes mellitus, well controlled  OBSTRUCTIVE SLEEP APNEA  Obesity (BMI 30.0-34.9)  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  ARF (acute renal failure)  Acute respiratory failure with hypoxia  Acidosis  Hypotension  Severe sepsis  Protein-calorie malnutrition, severe  Hypomagnesemia    PULMONARY No results found for this basename:  PHART:5,PCO2:5,PCO2ART:5,PO2ART:5,HCO3:5,O2SAT:5 in the last 168 hours Ventilator Settings: Vent Mode:  [-] PSV FiO2 (%):  [29.1 %-30.3 %] 30 % Set Rate:  [12 bmp] 12 bmp Vt Set:  [600 mL] 600 mL PEEP:  [5 cmH20] 5 cmH20 Pressure Support:  [5 cmH20] 5 cmH20 Plateau Pressure:  [18 cmH20-26 cmH20] 18 cmH20  ETT 02/16/2012 >> 02/20/12 (self extubated post OR)  ETT 02/20/12 >>9/27  A:  Acute respiratory failure in the setting of peritonitis>>IMPROVED. Acute pulmonary edema and increased lung water.>>improved with diuresis Hx of OSA.  P:   Extubate More lasix    CARDIOVASCULAR  Lab 02/24/12 0844 02/24/12 0225 02/23/12 2040  TROPONINI <0.30 <0.30 <0.30  LATICACIDVEN -- -- --  PROBNP -- -- --   Rt IJ CVL 9/15>> A line 02/16/2012 >>02/19/12  A: Septic shock from peritonitis>>resolved.  I>>>O +18L Hx of HTN. P:  Cont  Lasix x one more dose   RENAL  Lab 02/28/12 0400 02/27/12 0426 02/26/12 0415 02/25/12 0508 02/24/12 0225 02/23/12 0400 02/22/12 0418  NA 137 136 142 138 137 -- --  K 4.0 4.6 -- -- -- -- --  CL 103 107 120* 111 109 -- --  CO2 28 22 17* 21 21 -- --  BUN 19 18 14 20  27* -- --  CREATININE 0.91 0.90 0.74 0.97 1.18 -- --  CALCIUM 8.4 7.8* 5.8* 7.2* 7.4* -- --  MG 1.2* 1.3* -- 1.7 1.8 1.9 --  PHOS -- 3.0 -- -- 3.6 3.5 3.5   Intake/Output      09/26 0701 -  09/27 0700 09/27 0701 - 09/28 0700   I.V. (mL/kg) 2120.3 (19.4) 40 (0.4)   Blood     NG/GT 220 40   IV Piggyback 766    TPN 2420 110   Total Intake(mL/kg) 5526.3 (50.5) 190 (1.7)   Urine (mL/kg/hr) 11200 (4.3) 350   Emesis/NG output     Drains 150    Stool     Total Output 11350 350   Net -5823.7 -160         Foley: 02/16/2012   A: Acute renal failure from shock>>RESOLVED  Metabolic acidosis>>resolved. Hypokalemia>>>repleted Hypocalcemia>IMPROVED Hypomagnesemia   P Monitor BMET daily Replete Mg   GASTROINTESTINAL  Lab 02/27/12 0426 02/25/12 0508 02/24/12 0225 02/23/12 0400  AST 35 36 36 36    ALT 21 25 18 18   ALKPHOS 111 97 77 87  BILITOT 1.3* 1.3* 2.0* 2.1*  PROT 6.1 5.5* 5.3* 5.6*  ALBUMIN 1.3* 1.2* 1.1* 1.3*    A: Acute peritonitis 2nd to anastomotic leak s/p ex lap 9/15.  Wound closed 9/26.   ON TPN Nutrition for severe protein calorie malnutrition.  P:   Continue TPN  HEMATOLOGIC  Lab 02/28/12 0400 02/27/12 0426 02/26/12 0415 02/25/12 0341 02/24/12 0225  HGB 10.7* 10.4* 7.1* 7.7* 8.9*  HCT 30.8* 30.7* 20.9* 23.1* 25.9*  PLT 351 327 294 273 324  INR -- -- -- -- --  APTT -- -- -- -- --   A: Anemia of critical illness. P:  F/u CBC Transfuse for Hb < /= 7>>for two units PRBC 02/28/2012 for OR  INFECTIOUS  Lab 02/28/12 0400 02/27/12 0426 02/26/12 0415 02/25/12 0341 02/24/12 0225 02/22/12 0418  WBC 11.9* 15.0* 8.6 8.8 13.7* --  PROCALCITON -- -- -- -- -- 2.55   Cultures: Urine 9/15>>negative Blood 9/18>>neg Urine 9/18>>negative Sputum 9/18>>Candida  Antibiotics: Rocephin 9/15>>9/15 Ertapenem 9/15>>9/17 Zosyn (abd leak, peritonitis) 9/18>>Stop Date 9/29 Vancomycin (abd leak, peritonitis)9/18>> stop date 9/29 Diflucan (peritonitis, abd leak) 9/20>>stop date 9/29  A: Septic shock from peritonitis.  Procalcitonin trending down.  Recurrent fever 9/18 and 9/20>>improved with change in Abx and adding anti-fungal. P:   Abx as above to continue.  Plan 10days on all ABX total >>9/29  ENDOCRINE  Lab 02/28/12 0741 02/28/12 0343 02/28/12 0020 02/27/12 1948 02/27/12 1601  GLUCAP 192* 301* 292* 206* 204*   A: DM type II, CBGs high on TPN>>better with more insulin in TPN P:   SSI Insulin added to TPN   NEUROLOGIC  A:  Pain control good. P:   Wean precedex to off after off vent Fentanyl for pain   BEST PRACTICE / DISPOSITION Level of Care:  ICU Primary Service:  CCS Consultants:  PCCM Code Status:  Full Diet:  TPN DVT Px:  SCD GI Px:  Protonix Social/family:no family present on AM rounds  Critical care time 35 minutes.  Mariel Sleet [place]  [phone]  Cell  [phone]  If no response or cell goes to voicemail, call beeper [phone]  02/28/2012, 8:58 AM

## 2012-02-28 NOTE — Procedures (Signed)
Extubation Procedure Note  Patient Details:   Name: [name] DOB: [birth year] MRN: [id number]   Airway Documentation:     Evaluation  O2 sats: stable throughout Complications: No apparent complications Patient did tolerate procedure well. Bilateral Breath Sounds: Rales Suctioning: Oral;Airway Yes  Pt tolerated wean, okay to extubate per MD. Pt positive for cuff leak, extubated and placed on 5lpm . Pt resting comfortably at this time. Vitals are within normal limits. RT will continue to monitor.   Claudean Severance 02/28/2012, 9:05 AM

## 2012-02-28 NOTE — Progress Notes (Signed)
2 Days Post-Op  Subjective: Awake, calm at present, vent weaning in progress.  Objective: Vital signs in last 24 hours: Temp:  [97 F (36.1 C)-98.4 F (36.9 C)] 97 F (36.1 C) (09/27 0749) Pulse Rate:  [44-62] 52  (09/27 0700) Resp:  [3-32] 17  (09/27 0700) BP: (107-170)/(56-100) 145/69 mmHg (09/27 0700) SpO2:  [96 %-100 %] 100 % (09/27 0700) FiO2 (%):  [29.1 %-30.3 %] 30 % (09/27 0828) Weight:  [241 lb 2.9 oz (109.4 kg)] 241 lb 2.9 oz (109.4 kg) (09/27 0500) Last BM Date: 02/25/12 (ileostomy)  Intake/Output from previous day: 09/26 0701 - 09/27 0700 In: 5526.3 [I.V.:2120.3; NG/GT:220; IV Piggyback:766; TPN:2420] Out: 11350 [Urine:11200; Drains:150] Intake/Output this shift: Total I/O In: 190 [I.V.:40; NG/GT:40; TPN:110] Out: 350 [Urine:350]  General appearance: alert, cooperative, appears stated age and no distress Remains on vent weaning in progress, sat's 100% RR 20's on PS ventilation Chest: CTA bilaterall, minimal secretions Cardiac: brady, SR. Abdomen: distended, minimal BS, ostomy appears well perfused; serous drainage in ostomy pouch. Wound vac in place and functioning; (SS output 150 ml/24 hr) WBC is trending downward, H&H stable.  Lab Results:   Basename 02/28/12 0400 02/27/12 0426  WBC 11.9* 15.0*  HGB 10.7* 10.4*  HCT 30.8* 30.7*  PLT 351 327   BMET  Basename 02/28/12 0400 02/27/12 0426  NA 137 136  K 4.0 4.6  CL 103 107  CO2 28 22  GLUCOSE 302* 218*  BUN 19 18  CREATININE 0.91 0.90  CALCIUM 8.4 7.8*   PT/INR No results found for this basename: LABPROT:2,INR:2 in the last 72 hours ABG No results found for this basename: PHART:2,PCO2:2,PO2:2,HCO3:2 in the last 72 hours  Studies/Results: Dg Chest Port 1 View  02/28/2012  *RADIOLOGY REPORT*  Clinical Data: Pulmonary edema  PORTABLE CHEST - 1 VIEW  Comparison: 02/27/2012  Findings: Endotracheal tube, NG tube,   right hip are stable.  Hazy opacity at both lung bases right greater than left has  improved. No pneumothorax.  IMPRESSION: Improved bilateral pleural effusions and bibasilar volume loss right greater than left.   Original Report Authenticated By: Jamas Lav, M.D.    Dg Chest Port 1 View  02/27/2012  *RADIOLOGY REPORT*  Clinical Data: Evaluate endotracheal tube, atelectasis  PORTABLE CHEST - 1 VIEW  Comparison: 02/26/2012; 02/25/2012; 02/24/2012  Findings: Grossly unchanged enlarged cardiac silhouette and mediastinal contours.  Stable position of support apparatus. Pulmonary vasculature remains indistinct.  Grossly unchanged layering bilateral pleural effusions and basilar heterogeneous opacities, right greater than left.  No definite pneumothorax. Unchanged bones.  IMPRESSION: 1.  Stable positioning of support apparatus.  No pneumothorax. 2.  Grossly unchanged findings of pulmonary edema, layering bilateral effusions and bibasilar opacities, right greater than left, possibly atelectasis   Original Report Authenticated By: Rachel Moulds, M.D.     Anti-infectives: Anti-infectives     Start     Dose/Rate Route Frequency Ordered Stop   02/26/12 0000   vancomycin (VANCOCIN) 1,250 mg in sodium chloride 0.9 % 250 mL IVPB        1,250 mg 166.7 mL/hr over 90 Minutes Intravenous Every 12 hours 02/25/12 1241     02/22/12 0000   vancomycin (VANCOCIN) IVPB 1000 mg/200 mL premix  Status:  Discontinued        1,000 mg 200 mL/hr over 60 Minutes Intravenous Every 12 hours 02/21/12 0929 02/25/12 1241   02/21/12 1000   fluconazole (DIFLUCAN) IVPB 400 mg        400 mg  200 mL/hr over 60 Minutes Intravenous Every 24 hours 02/21/12 0929     02/20/12 1000   vancomycin (VANCOCIN) 1,500 mg in sodium chloride 0.9 % 500 mL IVPB        1,500 mg 250 mL/hr over 120 Minutes Intravenous Every 24 hours 02/19/12 0942 02/21/12 1246   02/19/12 1000  piperacillin-tazobactam (ZOSYN) IVPB 3.375 g       3.375 g 12.5 mL/hr over 240 Minutes Intravenous 3 times per day 02/19/12 0942     02/19/12 1000    vancomycin (VANCOCIN) 2,000 mg in sodium chloride 0.9 % 500 mL IVPB        2,000 mg 250 mL/hr over 120 Minutes Intravenous  Once 02/19/12 0942 02/19/12 1335   02/16/12 1615   ertapenem (INVANZ) 1 g in sodium chloride 0.9 % 50 mL IVPB        1 g 100 mL/hr over 30 Minutes Intravenous To Surgery 02/16/12 1606 02/16/12 1733   02/16/12 1600   ertapenem (INVANZ) 1 g in sodium chloride 0.9 % 50 mL IVPB  Status:  Discontinued        1 g 100 mL/hr over 30 Minutes Intravenous Every 24 hours 02/16/12 1508 02/19/12 0909   02/16/12 1415   cefTRIAXone (ROCEPHIN) 1 g in dextrose 5 % 50 mL IVPB        1 g 100 mL/hr over 30 Minutes Intravenous  Once 02/16/12 1407 02/16/12 1448          Assessment/Plan:   Patient Active Problem List  Diagnosis  . Type II diabetes mellitus, well controlled  . Hyperlipidemia LDL goal <70  . MICROCYTIC ANEMIA  . TOBACCO ABUSE  . OBSTRUCTIVE SLEEP APNEA  . HYPERTENSION  . H/O colonoscopy with polypectomy  . Cataract, bilateral  . Obesity (BMI 30.0-34.9)  . Health care maintenance  . 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  . Left knee pain  . Hyperkalemia  . ARF (acute renal failure)  . Acute respiratory failure with hypoxia  . Acidosis  . Hypotension  . Peritonitis  . Severe sepsis  . Protein-calorie malnutrition, severe   s/p Procedure(s) (LRB) with comments: EXPLORATORY LAPAROTOMY (N/A) -   APPLICATION OF WOUND VAC (N/A) Vent weaning per CCM Trickle tube feeds Wound vac change today    LOS: 12 days    Maylon Sailors 02/28/2012

## 2012-02-28 NOTE — Plan of Care (Signed)
Problem: Phase II Progression Outcomes Goal: Date pt extubated/weaned off vent Outcome: Completed/Met Date Met:  02/28/12 Extubated today @ 0900 Goal: Time pt extubated/weaned off vent Outcome: Completed/Met Date Met:  02/28/12 Extubated @ 0900

## 2012-02-28 NOTE — Progress Notes (Signed)
Wasted approx. 75cc of Fentanyl drip in sink with Vivia Birmingham, RN as witness.

## 2012-02-29 ENCOUNTER — Inpatient Hospital Stay (HOSPITAL_COMMUNITY): Payer: Medicaid Other

## 2012-02-29 LAB — GLUCOSE, CAPILLARY
Glucose-Capillary: 143 mg/dL — ABNORMAL HIGH (ref 70–99)
Glucose-Capillary: 154 mg/dL — ABNORMAL HIGH (ref 70–99)
Glucose-Capillary: 156 mg/dL — ABNORMAL HIGH (ref 70–99)
Glucose-Capillary: 174 mg/dL — ABNORMAL HIGH (ref 70–99)
Glucose-Capillary: 185 mg/dL — ABNORMAL HIGH (ref 70–99)
Glucose-Capillary: 190 mg/dL — ABNORMAL HIGH (ref 70–99)
Glucose-Capillary: 261 mg/dL — ABNORMAL HIGH (ref 70–99)

## 2012-02-29 LAB — BASIC METABOLIC PANEL WITH GFR
BUN: 24 mg/dL — ABNORMAL HIGH (ref 6–23)
CO2: 28 meq/L (ref 19–32)
Calcium: 8.2 mg/dL — ABNORMAL LOW (ref 8.4–10.5)
Chloride: 101 meq/L (ref 96–112)
Creatinine, Ser: 1.15 mg/dL (ref 0.50–1.35)
GFR calc Af Amer: 78 mL/min — ABNORMAL LOW
GFR calc non Af Amer: 67 mL/min — ABNORMAL LOW
Glucose, Bld: 177 mg/dL — ABNORMAL HIGH (ref 70–99)
Potassium: 3.9 meq/L (ref 3.5–5.1)
Sodium: 137 meq/L (ref 135–145)

## 2012-02-29 LAB — CBC
HCT: 26.9 % — ABNORMAL LOW (ref 39.0–52.0)
Hemoglobin: 9.5 g/dL — ABNORMAL LOW (ref 13.0–17.0)
MCH: 25.8 pg — ABNORMAL LOW (ref 26.0–34.0)
MCHC: 35.3 g/dL (ref 30.0–36.0)
MCV: 73.1 fL — ABNORMAL LOW (ref 78.0–100.0)
Platelets: 431 10*3/uL — ABNORMAL HIGH (ref 150–400)
RBC: 3.68 MIL/uL — ABNORMAL LOW (ref 4.22–5.81)
RDW: 20.2 % — ABNORMAL HIGH (ref 11.5–15.5)
WBC: 13.5 10*3/uL — ABNORMAL HIGH (ref 4.0–10.5)

## 2012-02-29 LAB — MAGNESIUM: Magnesium: 1.4 mg/dL — ABNORMAL LOW (ref 1.5–2.5)

## 2012-02-29 IMAGING — CR DG CHEST 1V PORT
1 series · 1 of 1 positions shown · non-contrast
Comparison: Chest [DATE].

CLINICAL DATA: Atelectasis.  Pleural effusion.

PORTABLE CHEST - 1 VIEW

[AP]
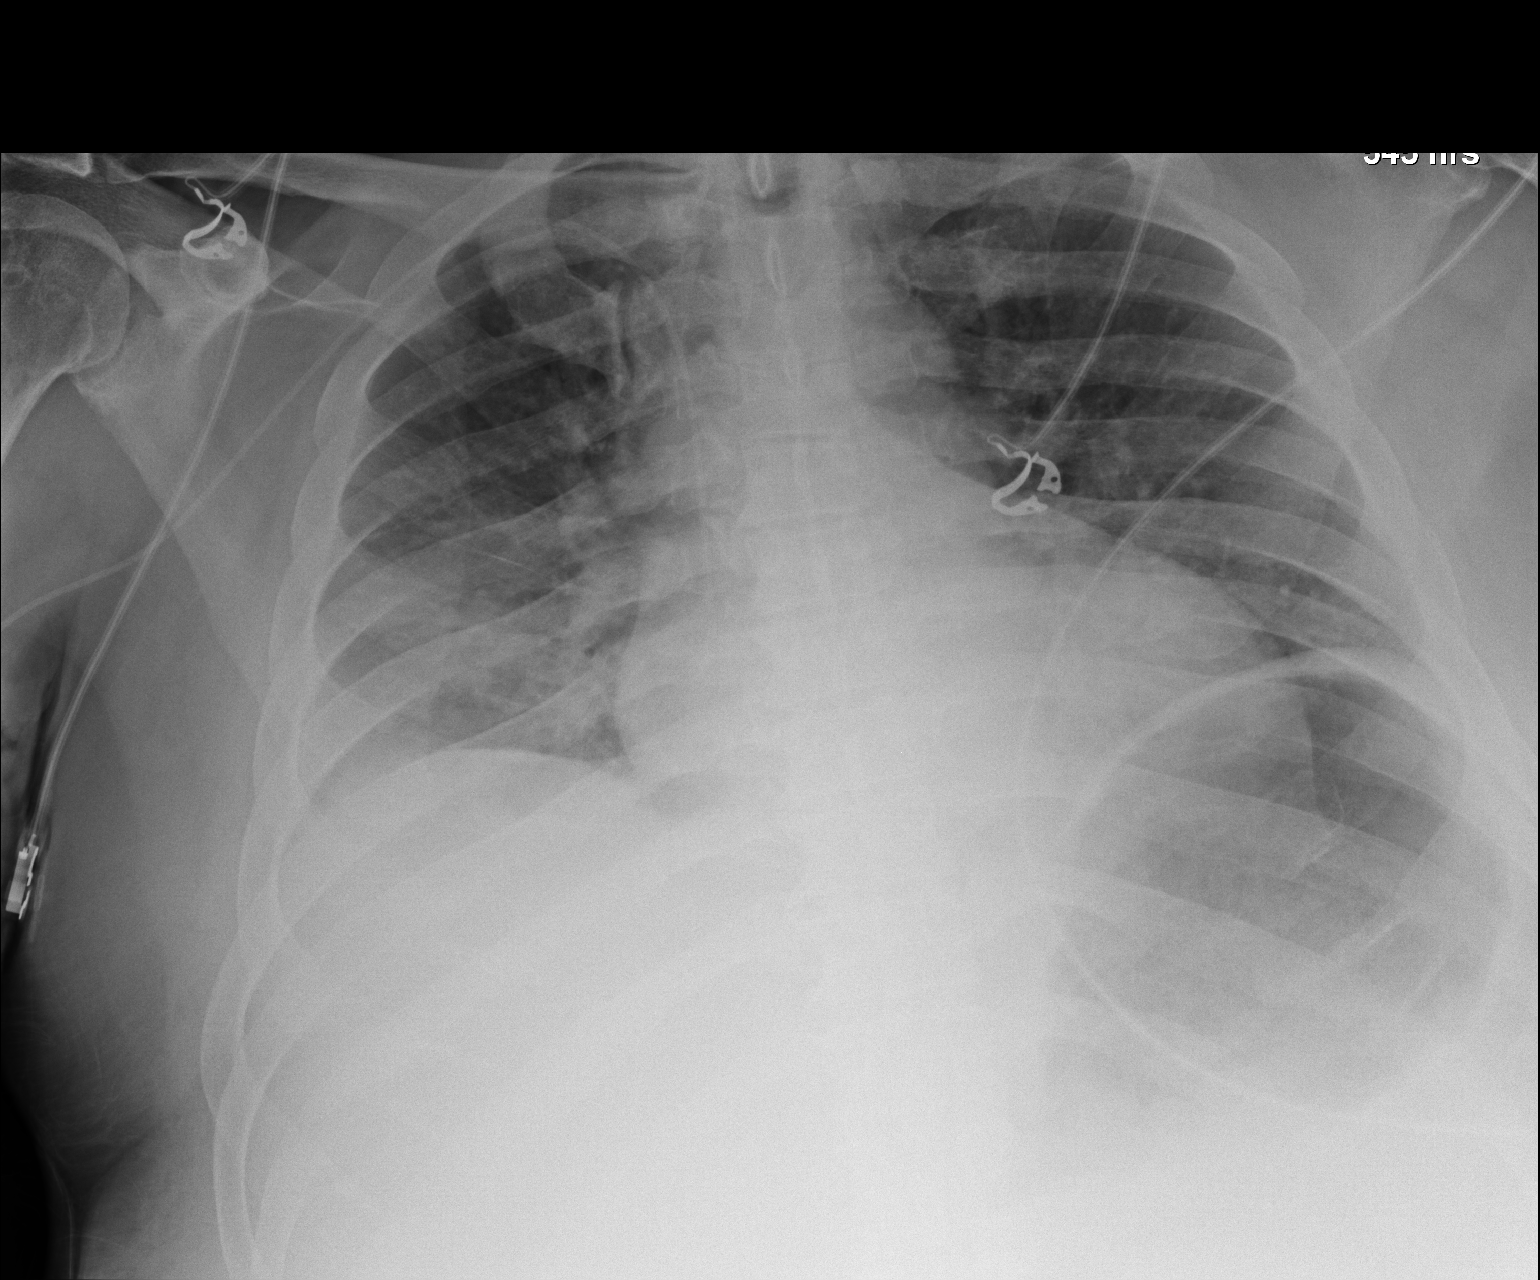

[1 of 1 positions shown; findings below may reference images not displayed]

FINDINGS: Endotracheal tube and NG tube have been removed.  Right
PICC remains in place.  Right greater right pleural effusions and
airspace disease appear improved.  No pneumothorax.  Heart size
upper normal.
IMPRESSION: 1.  Status post extubation and NG tube removal.
2.  Improved right greater than left pleural effusions and basilar
airspace disease.

## 2012-02-29 MED ORDER — INFLUENZA VIRUS VACC SPLIT PF IM SUSP
0.5000 mL | INTRAMUSCULAR | Status: AC
  Administered 2012-03-01: 0.5 mL via INTRAMUSCULAR
  Filled 2012-02-29: qty 0.5

## 2012-02-29 MED ORDER — CLINIMIX E/DEXTROSE (5/15) 5 % IV SOLN
INTRAVENOUS | Status: DC
  Administered 2012-02-29: 18:00:00 via INTRAVENOUS
  Filled 2012-02-29: qty 2640

## 2012-02-29 MED ORDER — FUROSEMIDE 10 MG/ML IJ SOLN
40.0000 mg | Freq: Every day | INTRAMUSCULAR | Status: DC
  Administered 2012-02-29 – 2012-03-03 (×4): 40 mg via INTRAVENOUS
  Filled 2012-02-29 (×5): qty 4

## 2012-02-29 MED ORDER — MAGNESIUM SULFATE 50 % IJ SOLN
3.0000 g | Freq: Once | INTRAMUSCULAR | Status: AC
  Administered 2012-02-29: 3 g via INTRAVENOUS
  Filled 2012-02-29: qty 6

## 2012-02-29 NOTE — Progress Notes (Signed)
PARENTERAL NUTRITION CONSULT NOTE - Follow up  Pharmacy Consult:  TPN Indication: Prolonged ileus  No Known Allergies  Patient Measurements: Height: 5\' 11"  (180.3 cm) Weight: 221 lb 1.9 oz (100.3 kg) IBW/kg (Calculated) : 75.3  Ideal Body Weight: 80 Usual Weight: 117kg  Vital Signs: Temp: 99.6 F (37.6 C) (09/28 0737) Temp src: Oral (09/28 0737) BP: 110/50 mmHg (09/28 0700) Pulse Rate: 81  (09/28 0700) Intake/Output from previous day: 09/27 0701 - 09/28 0700 In: 4173.7 [I.V.:520; NG/GT:60; IV Piggyback:900; TPN:2693.7] Out: [id number] [Urine:3700; Drains:100; Stool:2725]  Labs:  [facility] 02/29/12 0545 02/28/12 0400 02/27/12 0426  WBC 13.5* 11.9* 15.0*  HGB 9.5* 10.7* 10.4*  HCT 26.9* 30.8* 30.7*  PLT 431* 351 327  APTT -- -- --  INR -- -- --     Basename 02/29/12 0545 02/28/12 1821 02/28/12 1600 02/28/12 0400 02/27/12 0426  NA 137 135 129* -- --  K 3.9 4.0 5.4* -- --  CL 101 99 94* -- --  CO2 28 28 26  -- --  GLUCOSE 177* 128* 696* -- --  BUN 24* 21 20 -- --  CREATININE 1.15 0.98 0.92 -- --  LABCREA -- -- -- -- --  CREAT24HRUR -- -- -- -- --  CALCIUM 8.2* 8.3* 8.4 -- --  MG 1.4* -- 1.6 1.2* --  PHOS -- -- -- -- 3.0  PROT -- -- -- -- 6.1  ALBUMIN -- -- -- -- 1.3*  AST -- -- -- -- 35  ALT -- -- -- -- 21  ALKPHOS -- -- -- -- 111  BILITOT -- -- -- -- 1.3*  BILIDIR -- -- -- -- --  IBILI -- -- -- -- --  PREALBUMIN -- -- -- -- --  TRIG -- -- -- -- --  CHOLHDL -- -- -- -- --  CHOL -- -- -- -- --   Estimated Creatinine Clearance: 82.4 ml/min (by C-G formula based on Cr of 1.15).    Basename 02/29/12 0735 02/29/12 0404 02/28/12 2343  GLUCAP 174* 190* 143*     Insulin Requirements in the past 24 hours:  30 units SSI, 35 units in TPN  Current Nutrition:  Clinimix E 5/15 at 119mL/hr   Assessment: 61 year old man underwent hemicolectomy on 9/5 2/2 stage II colon CA and was discharged on 9/12.  He was readmitted 9/14 and underwent surgery for anastomatic leak  and ileostomy on 9/15.  TPN initiated for prolonged ileus and prolonged fasting.   GI:  9/25 abd fascia closed. Distended abd with hypoactive BS. TFs stopped 9/27 with extubation. Drain O/P decreasing, large amount of stool (2768mL) Endo:  hx IDDM, CBGs elevated 116-190, on resistant SSI + insulin in TPN Lytes: all WNL except low magnesium, s/p 3 gm yesterday.  Expect lytes to trend down if stool O/P does not decrease. Renal: SCr up 1.15, received Lasix, lytes WNL, excellent UOP ID:  Vanc/Zosyn D11 + fluconazole D9 for peritonitis 2/2 anastomotic leak.  Has been on antibiotics since 9/15.  Tmax 100.5, WBC trending up  Urine 9/15>>NG  Blood 9/18>>NG  Urine 9/18>>NG  Sputum 9/18>> few Candida  Heme/Onc: Stage II adenocarcinoma of the colon - hgb down 9.5, plts trending up Pulm: hx OSA. Extubated 9/27, now on RA Cards:  Hx HTN / HLD - VSS stoday Hepatobil:  LFTs/Alk Phos wnl. Tbili slightly high at 1.3. Alb extremely low at 1.3 in the setting of ongoing inflammation with SIRS/sepsis/trips to OR. TG high at 198. Prealbumin very low at 4.0 with ongoing illness,  expect this to increase with nutritional support Neuro: Fent/Versed prn, has had periods of agitation since admit Best Practices: SCDs, MC, PPI TPN Access: PICC triple lumen placed 9/22 TPN day#: 7  Nutritional Goals:  1900-2000 kCal, 120-142 gm protein   Plan:  - Continue Clinimix E 5/15 at 114mL/hr, providing 132 gm protein and 2088 avg kcal, meeting 100% of needs - Increase insulin in TPN to 44 units - Multivitamins, trace elements, and lipids will be added on MWF only due to national shortage. - Magnesium sulfate 3gm IV x 1 - F/U diet initiation and advancement - F/U AM labs     Deyana Wnuk D. Mina Marble, PharmD, BCPS Pager:  [phone] 02/29/2012, 8:35 AM

## 2012-02-29 NOTE — Progress Notes (Signed)
Name: [name] MRN: [id number] DOB: 1951-04-28    LOS: 13 days Date of admit: 02/16/2012 PCP is [name], MD   Referring Provider:  [name] of [facility] Reason for Referral:  Post op resp failure, metabolic acidosis and hypotension   PULMONARY / CRITICAL CARE MEDICINE  HPI: 61 yo male smoker s/p colonoscopy and found to have 3-4 cm sessile mass in distal transverse colon.  Had laparoscopic Rt hemicolectomy 9/05, and d/c home 9/12.  He developed N/V and abd pain, and found to have pneumoperitoneum 9/15 from anastomotic leak.  He had ileocolonic resection, ileostomy, and wound vac.  He remained on vent post-op, and PCCM consulted.  Events Since Admission: 02/16/2012 >>Laparotomy 02/29/2012>>return to OR for closure  SUBJECTIVE: Low gr fevers Last dose haldol MN Decreased confusion   Vital Signs: Temp:  [98.2 F (36.8 C)-100.5 F (38.1 C)] 99.6 F (37.6 C) (09/28 0737) Pulse Rate:  [69-97] 79  (09/28 0900) Resp:  [16-32] 32  (09/28 0900) BP: (95-143)/(47-116) 122/55 mmHg (09/28 0900) SpO2:  [96 %-100 %] 100 % (09/28 0900) Weight:  [100.3 kg (221 lb 1.9 oz)] 100.3 kg (221 lb 1.9 oz) (09/28 0400)  Physical Examination: General - calm ,denies pain HEENT - ett in place  Cardiac - s1s2 regular, no murmur Chest - no wheeze, distant Bs Abd - soft, mildly distended, ileostomy and wound vac in place Ext - 1+  edema Neuro -much calmer, non focal   ASSESSMENT AND PLAN Principal Problem:  *Peritonitis Active Problems:  Type II diabetes mellitus, well controlled  OBSTRUCTIVE SLEEP APNEA  Obesity (BMI 30.0-34.9)  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  ARF (acute renal failure)  Acute respiratory failure with hypoxia  Acidosis  Hypotension  Severe sepsis  Protein-calorie malnutrition, severe  Hypomagnesemia    PULMONARY No results found for this basename: PHART:5,PCO2:5,PCO2ART:5,PO2ART:5,HCO3:5,O2SAT:5 in the last 168 hours    ETT  02/16/2012 >> 02/20/12 (self extubated post OR)  ETT 02/20/12 >>9/27  A:  Acute respiratory failure in the setting of peritonitis>>IMPROVED. Acute pulmonary edema and increased lung water.>>improved with diuresis Hx of OSA.  9/28 decreased effusions P:   Extubated More lasix    CARDIOVASCULAR  Lab 02/24/12 0844 02/24/12 0225 02/23/12 2040  TROPONINI <0.30 <0.30 <0.30  LATICACIDVEN -- -- --  PROBNP -- -- --   Rt IJ CVL 9/15>> A line 02/16/2012 >>02/19/12  A: Septic shock from peritonitis>>resolved.  I>>>O +18L Hx of HTN. P:  Cont  Lasix  For equal balance   RENAL  Lab 02/29/12 0545 02/28/12 1821 02/28/12 1600 02/28/12 0400 02/27/12 0426 02/25/12 0508 02/24/12 0225 02/23/12 0400  NA 137 135 129* 137 136 -- -- --  K 3.9 4.0 -- -- -- -- -- --  CL 101 99 94* 103 107 -- -- --  CO2 28 28 26 28 22  -- -- --  BUN 24* 21 20 19 18  -- -- --  CREATININE 1.15 0.98 0.92 0.91 0.90 -- -- --  CALCIUM 8.2* 8.3* 8.4 8.4 7.8* -- -- --  MG 1.4* -- 1.6 1.2* 1.3* 1.7 -- --  PHOS -- -- -- -- 3.0 -- 3.6 3.5   Intake/Output      09/27 0701 - 09/28 0700 09/28 0701 - 09/29 0700   I.V. (mL/kg) 520 (5.2) 40 (0.4)   NG/GT 60    IV Piggyback 900    TPN 2693.7    Total Intake(mL/kg) 4173.7 (41.6) 40 (0.4)   Urine (mL/kg/hr) 3700 (1.5)  Drains 100    Stool 2725    Total Output 6525    Net -2351.3 +40         Foley: 02/16/2012   A: Acute renal failure from shock>>RESOLVED  Metabolic acidosis>>resolved. Hypokalemia>>>repleted Hypocalcemia>IMPROVED Hypomagnesemia   P Monitor BMET daily Replete Mg   GASTROINTESTINAL  Lab 02/27/12 0426 02/25/12 0508 02/24/12 0225 02/23/12 0400  AST 35 36 36 36  ALT 21 25 18 18   ALKPHOS 111 97 77 87  BILITOT 1.3* 1.3* 2.0* 2.1*  PROT 6.1 5.5* 5.3* 5.6*  ALBUMIN 1.3* 1.2* 1.1* 1.3*    A: Acute peritonitis 2nd to anastomotic leak s/p ex lap 9/15.  Wound closed 9/26.   ON TPN Nutrition for severe protein calorie malnutrition.  P:   Continue  TPN  HEMATOLOGIC  Lab 02/29/12 0545 02/28/12 0400 02/27/12 0426 02/26/12 0415 02/25/12 0341  HGB 9.5* 10.7* 10.4* 7.1* 7.7*  HCT 26.9* 30.8* 30.7* 20.9* 23.1*  PLT 431* 351 327 294 273  INR -- -- -- -- --  APTT -- -- -- -- --   A: Anemia of critical illness. P:  F/u CBC Transfuse for Hb < /= 7>>for two units PRBC 02/29/2012 for OR  INFECTIOUS  Lab 02/29/12 0545 02/28/12 0400 02/27/12 0426 02/26/12 0415 02/25/12 0341  WBC 13.5* 11.9* 15.0* 8.6 8.8  PROCALCITON -- -- -- -- --   Cultures: Urine 9/15>>negative Blood 9/18>>neg Urine 9/18>>negative Sputum 9/18>>Candida  Antibiotics: Rocephin 9/15>>9/15 Ertapenem 9/15>>9/17 Zosyn (abd leak, peritonitis) 9/18>>Stop Date 9/29 Vancomycin (abd leak, peritonitis)9/18>> stop date 9/29 Diflucan (peritonitis, abd leak) 9/20>>stop date 9/29  A: Septic shock from peritonitis.  Procalcitonin trending down.  Recurrent fever 9/18 and 9/20>>improved with change in Abx and adding anti-fungal. P:   Abx as above to continue.  Plan 10days on all ABX total >>9/29  ENDOCRINE  Lab 02/29/12 0735 02/29/12 0404 02/28/12 2343 02/28/12 1946 02/28/12 1547  GLUCAP 174* 190* 143* 116* 112*   A: DM type II, CBGs high on TPN>>better with more insulin in TPN P:   SSI Insulin added to TPN   NEUROLOGIC  A:  Pain control good. P:   Haldol for agitation Fentanyl for pain  PT eval  BEST PRACTICE / DISPOSITION Level of Care:  ICU Primary Service:  [facility]S Consultants:  PCCM Code Status:  Full Diet:  TPN DVT Px:  SCD GI Px:  Protonix Social/family:no family present on AM rounds   Kara Mead MD. FCCP. [facility] Pager [phone] If no response call [phone]    02/29/2012, 11:09 AM

## 2012-02-29 NOTE — Progress Notes (Signed)
3 Days Post-Op  Subjective: Patient remains extubated, but mildly confused.  Productive cough Large amount of ileostomy output yesterday Wound VAC changed yesterday.  Objective: Vital signs in last 24 hours: Temp:  [98.2 F (36.8 C)-100.5 F (38.1 C)] 99.8 F (37.7 C) (09/28 1136) Pulse Rate:  [69-97] 87  (09/28 1200) Resp:  [16-32] 26  (09/28 1200) BP: (95-159)/(47-87) 135/52 mmHg (09/28 1200) SpO2:  [96 %-100 %] 100 % (09/28 1200) Weight:  [221 lb 1.9 oz (100.3 kg)] 221 lb 1.9 oz (100.3 kg) (09/28 0400) Last BM Date: 02/25/12 (ileostomy)  Intake/Output from previous day: 09/27 0701 - 09/28 0700 In: 4173.7 [I.V.:520; NG/GT:60; IV Piggyback:900; TPN:2693.7] Out: 6525 [Urine:3700; Drains:100; Stool:2725] Intake/Output this shift: Total I/O In: 956 [I.V.:100; IV Piggyback:306; TPN:550] Out: 825 [Urine:250; Stool:575]  General appearance: no distress GI: soft, midline VAC with good seal Ostomy - pink; large amount of thin ostomy output  Lab Results:   Basename 02/29/12 0545 02/28/12 0400  WBC 13.5* 11.9*  HGB 9.5* 10.7*  HCT 26.9* 30.8*  PLT 431* 351   BMET  Basename 02/29/12 0545 02/28/12 1821  NA 137 135  K 3.9 4.0  CL 101 99  CO2 28 28  GLUCOSE 177* 128*  BUN 24* 21  CREATININE 1.15 0.98  CALCIUM 8.2* 8.3*   PT/INR No results found for this basename: LABPROT:2,INR:2 in the last 72 hours ABG No results found for this basename: PHART:2,PCO2:2,PO2:2,HCO3:2 in the last 72 hours  Studies/Results: Dg Chest Port 1 View  02/29/2012  *RADIOLOGY REPORT*  Clinical Data: Atelectasis.  Pleural effusion.  PORTABLE CHEST - 1 VIEW  Comparison: Chest 02/28/2012.  Findings: Endotracheal tube and NG tube have been removed.  Right PICC remains in place.  Right greater right pleural effusions and airspace disease appear improved.  No pneumothorax.  Heart size upper normal.  IMPRESSION:  1.  Status post extubation and NG tube removal. 2.  Improved right greater than left pleural  effusions and basilar airspace disease.   Original Report Authenticated By: Arvid Right. Luther Parody, M.D.    Dg Chest Port 1 View  02/28/2012  *RADIOLOGY REPORT*  Clinical Data: Pulmonary edema  PORTABLE CHEST - 1 VIEW  Comparison: 02/27/2012  Findings: Endotracheal tube, NG tube,   right hip are stable.  Hazy opacity at both lung bases right greater than left has improved. No pneumothorax.  IMPRESSION: Improved bilateral pleural effusions and bibasilar volume loss right greater than left.   Original Report Authenticated By: Jamas Lav, M.D.     Anti-infectives: Anti-infectives     Start     Dose/Rate Route Frequency Ordered Stop   02/26/12 0000   vancomycin (VANCOCIN) 1,250 mg in sodium chloride 0.9 % 250 mL IVPB  Status:  Discontinued        1,250 mg 166.7 mL/hr over 90 Minutes Intravenous Every 12 hours 02/25/12 1241 02/29/12 1118   02/22/12 0000   vancomycin (VANCOCIN) IVPB 1000 mg/200 mL premix  Status:  Discontinued        1,000 mg 200 mL/hr over 60 Minutes Intravenous Every 12 hours 02/21/12 0929 02/25/12 1241   02/21/12 1000   fluconazole (DIFLUCAN) IVPB 400 mg        400 mg 200 mL/hr over 60 Minutes Intravenous Every 24 hours 02/21/12 0929     02/20/12 1000   vancomycin (VANCOCIN) 1,500 mg in sodium chloride 0.9 % 500 mL IVPB        1,500 mg 250 mL/hr over 120 Minutes Intravenous Every 24  hours 02/19/12 0942 02/21/12 1246   02/19/12 1000   piperacillin-tazobactam (ZOSYN) IVPB 3.375 g        3.375 g 12.5 mL/hr over 240 Minutes Intravenous 3 times per day 02/19/12 0942     02/19/12 1000   vancomycin (VANCOCIN) 2,000 mg in sodium chloride 0.9 % 500 mL IVPB        2,000 mg 250 mL/hr over 120 Minutes Intravenous  Once 02/19/12 0942 02/19/12 1335   02/16/12 1615   ertapenem (INVANZ) 1 g in sodium chloride 0.9 % 50 mL IVPB        1 g 100 mL/hr over 30 Minutes Intravenous To Surgery 02/16/12 1606 02/16/12 1733   02/16/12 1600   ertapenem (INVANZ) 1 g in sodium chloride 0.9 % 50  mL IVPB  Status:  Discontinued        1 g 100 mL/hr over 30 Minutes Intravenous Every 24 hours 02/16/12 1508 02/19/12 0909   02/16/12 1415   cefTRIAXone (ROCEPHIN) 1 g in dextrose 5 % 50 mL IVPB        1 g 100 mL/hr over 30 Minutes Intravenous  Once 02/16/12 1407 02/16/12 1448          Assessment/Plan: s/p Procedure(s) (LRB) with comments: EXPLORATORY LAPAROTOMY (N/A) -   APPLICATION OF WOUND VAC (N/A) May start clears today.  Begin weaning TNA tomorrow if able to tolerate PO's  LOS: 13 days    Shriyans Kuenzi K. 02/29/2012

## 2012-03-01 DIAGNOSIS — M7989 Other specified soft tissue disorders: Secondary | ICD-10-CM

## 2012-03-01 DIAGNOSIS — E46 Unspecified protein-calorie malnutrition: Secondary | ICD-10-CM

## 2012-03-01 LAB — CBC
HCT: 26.2 % — ABNORMAL LOW (ref 39.0–52.0)
Hemoglobin: 9 g/dL — ABNORMAL LOW (ref 13.0–17.0)
MCH: 25.2 pg — ABNORMAL LOW (ref 26.0–34.0)
MCHC: 34.4 g/dL (ref 30.0–36.0)
MCV: 73.4 fL — ABNORMAL LOW (ref 78.0–100.0)
Platelets: 395 10*3/uL (ref 150–400)
RBC: 3.57 MIL/uL — ABNORMAL LOW (ref 4.22–5.81)
RDW: 20.5 % — ABNORMAL HIGH (ref 11.5–15.5)
WBC: 10.9 10*3/uL — ABNORMAL HIGH (ref 4.0–10.5)

## 2012-03-01 LAB — GLUCOSE, CAPILLARY
Glucose-Capillary: 178 mg/dL — ABNORMAL HIGH (ref 70–99)
Glucose-Capillary: 203 mg/dL — ABNORMAL HIGH (ref 70–99)
Glucose-Capillary: 152 mg/dL — ABNORMAL HIGH (ref 70–99)
Glucose-Capillary: 183 mg/dL — ABNORMAL HIGH (ref 70–99)
Glucose-Capillary: 219 mg/dL — ABNORMAL HIGH (ref 70–99)

## 2012-03-01 LAB — MAGNESIUM: Magnesium: 1.8 mg/dL (ref 1.5–2.5)

## 2012-03-01 LAB — BASIC METABOLIC PANEL
BUN: 23 mg/dL (ref 6–23)
CO2: 28 mEq/L (ref 19–32)
Calcium: 8.2 mg/dL — ABNORMAL LOW (ref 8.4–10.5)
Chloride: 102 mEq/L (ref 96–112)
Creatinine, Ser: 1.03 mg/dL (ref 0.50–1.35)
GFR calc Af Amer: 89 mL/min — ABNORMAL LOW (ref >90)
GFR calc non Af Amer: 77 mL/min — ABNORMAL LOW (ref >90)
Glucose, Bld: 233 mg/dL — ABNORMAL HIGH (ref 70–99)
Potassium: 4.2 mEq/L (ref 3.5–5.1)
Sodium: 137 mEq/L (ref 135–145)

## 2012-03-01 MED ORDER — GABAPENTIN 300 MG PO CAPS
300.0000 mg | ORAL_CAPSULE | Freq: Two times a day (BID) | ORAL | Status: DC
  Administered 2012-03-01 – 2012-03-10 (×19): 300 mg via ORAL
  Filled 2012-03-01 (×22): qty 1

## 2012-03-01 MED ORDER — INSULIN REGULAR HUMAN 100 UNIT/ML IJ SOLN
INTRAMUSCULAR | Status: AC
  Administered 2012-03-01: 18:00:00 via INTRAVENOUS
  Filled 2012-03-01: qty 2000

## 2012-03-01 MED ORDER — INSULIN REGULAR HUMAN 100 UNIT/ML IJ SOLN
INTRAVENOUS | Status: AC
  Filled 2012-03-01: qty 2640

## 2012-03-01 MED ORDER — DEXTROSE 50 % IV SOLN
INTRAVENOUS | Status: AC
  Filled 2012-03-01: qty 50

## 2012-03-01 MED ORDER — INSULIN REGULAR HUMAN 100 UNIT/ML IJ SOLN
INTRAVENOUS | Status: DC
  Filled 2012-03-01 (×2): qty 2000

## 2012-03-01 NOTE — Evaluation (Signed)
Physical Therapy Evaluation Patient Details Name: [name] MRN: [id number] DOB: 10-Nov-1950 Today's Date: 03/01/2012 Time: [id number] PT Time Calculation (min): 28 min  PT Assessment / Plan / Recommendation Clinical Impression  Patient is a 61 yo male s/p expl lap with resection and ileostomy.  Wound vac to abdominal wound.  Patient with weakness/deconditioning and pain limiting mobility.  Recommend post-acute therapy in inpatient setting - Inpatient Rehab Consult.  Patient will benefit from acute PT to maximize independence.    PT Assessment  Patient needs continued PT services    Follow Up Recommendations  Inpatient Rehab    Barriers to Discharge None      Equipment Recommendations  3 in 1 bedside comode    Recommendations for Other Services Rehab consult;OT consult   Frequency Min 3X/week    Precautions / Restrictions Precautions Precautions: Fall Precaution Comments: Wound vac Restrictions Weight Bearing Restrictions: No   Pertinent Vitals/Pain Pain limiting mobility.      Mobility  Bed Mobility Bed Mobility: Rolling Left;Left Sidelying to Sit;Sit to Supine;Sitting - Scoot to Edge of Bed Rolling Left: 1: +2 Total assist;With rail Rolling Left: Patient Percentage: 50% Left Sidelying to Sit: 1: +2 Total assist;With rails Left Sidelying to Sit: Patient Percentage: 40% Sitting - Scoot to Edge of Bed: 1: +2 Total assist Sitting - Scoot to Edge of Bed: Patient Percentage: 40% Sit to Supine: 1: +2 Total assist Sit to Supine: Patient Percentage: 20% Details for Bed Mobility Assistance: Verbal cues for technique.  Pain in abdomen and UE weakness limiting mobility. Transfers Transfers: Not assessed           PT Diagnosis: Difficulty walking;Generalized weakness;Acute pain;Altered mental status  PT Problem List: Decreased strength;Decreased activity tolerance;Decreased balance;Decreased mobility;Decreased cognition;Decreased safety awareness;Obesity;Decreased skin  integrity;Pain PT Treatment Interventions: DME instruction;Gait training;Functional mobility training;Therapeutic activities;Therapeutic exercise;Balance training;Cognitive remediation;Patient/family education   PT Goals Acute Rehab PT Goals PT Goal Formulation: With patient Time For Goal Achievement: 03/15/12 Potential to Achieve Goals: Good Pt will Roll Supine to Left Side: with modified independence;with rail PT Goal: Rolling Supine to Left Side - Progress: Goal set today Pt will go Supine/Side to Sit: with supervision;with HOB 0 degrees PT Goal: Supine/Side to Sit - Progress: Goal set today Pt will go Sit to Supine/Side: with supervision;with HOB 0 degrees PT Goal: Sit to Supine/Side - Progress: Goal set today Pt will go Sit to Stand: with min assist;with upper extremity assist PT Goal: Sit to Stand - Progress: Goal set today Pt will go Stand to Sit: with min assist;with upper extremity assist PT Goal: Stand to Sit - Progress: Goal set today Pt will Transfer Bed to Chair/Chair to Bed: with min assist PT Transfer Goal: Bed to Chair/Chair to Bed - Progress: Goal set today Pt will Ambulate: 51 - 150 feet;with min assist;with rolling walker PT Goal: Ambulate - Progress: Goal set today  Visit Information  Last PT Received On: 03/01/12 Assistance Needed: +2    Subjective Data  Subjective: "If you knew how bad I feel" Patient Stated Goal: Decrease pain   Prior Functioning  Home Living Lives With: Alone;Daughter (Daughter staying with him while he is sick) Available Help at Discharge: Family Type of Home: House Home Access: Stairs to enter Technical brewer of Steps: 5 Entrance Stairs-Rails: Right;Left;Can reach both Home Layout: One level Bathroom Shower/Tub: Walk-in shower;Tub/shower unit Home Adaptive Equipment: Walker - rolling Prior Function Level of Independence: Independent Able to Take Stairs?: Reciprically Driving: Yes Vocation: Part time  employment Communication Communication:  No difficulties Dominant Hand: Right    Cognition  Overall Cognitive Status: Impaired Area of Impairment: Attention;Safety/judgement;Problem solving Arousal/Alertness: Lethargic Orientation Level: Appears intact for tasks assessed Behavior During Session: Lethargic Current Attention Level: Sustained Safety/Judgement: Decreased safety judgement for tasks assessed    Extremity/Trunk Assessment Right Upper Extremity Assessment RUE ROM/Strength/Tone: Deficits RUE ROM/Strength/Tone Deficits: General weakness Left Upper Extremity Assessment LUE ROM/Strength/Tone: Deficits LUE ROM/Strength/Tone Deficits: General weakness Right Lower Extremity Assessment RLE ROM/Strength/Tone: Deficits RLE ROM/Strength/Tone Deficits: Strength 3/5 RLE Sensation: History of peripheral neuropathy Left Lower Extremity Assessment LLE ROM/Strength/Tone: Deficits LLE ROM/Strength/Tone Deficits: Strength 3/5 LLE Sensation: History of peripheral neuropathy   Balance Balance Balance Assessed: Yes Static Sitting Balance Static Sitting - Balance Support: Bilateral upper extremity supported Static Sitting - Level of Assistance: 3: Mod assist Static Sitting - Comment/# of Minutes: Patient requiring assist to maintain sitting balance and posture.  Patient able to tolerate sitting x 6 minutes.  End of Session PT - End of Session Activity Tolerance: Patient limited by pain;Patient limited by fatigue Patient left: in bed;with call bell/phone within reach Nurse Communication: Mobility status;Need for lift equipment  GP     [name] 03/01/2012, 12:36 PM [name]. [name], [name] Pager [phone]

## 2012-03-01 NOTE — Progress Notes (Signed)
VASCULAR LAB PRELIMINARY  PRELIMINARY  PRELIMINARY  PRELIMINARY  Bilateral lower extremity venous duplex  completed.    Preliminary report:  Bilateral:  No evidence of DVT, superficial thrombosis, or Baker's Cyst.    Amarionna Arca, RVT 03/01/2012, 3:15 PM

## 2012-03-01 NOTE — Progress Notes (Signed)
Name: [name] MRN: [id number] DOB: [birth year]    LOS: 14 days Date of admit: 02/16/2012 PCP is [name], MD   Referring Provider:  [name] of [facility] Reason for Referral:  Post op resp failure, metabolic acidosis and hypotension   PULMONARY / CRITICAL CARE MEDICINE  HPI: 61 yo male smoker s/p colonoscopy and found to have 3-4 cm sessile mass in distal transverse colon.  Had laparoscopic Rt hemicolectomy 9/05, and d/c home 9/12.  He developed N/V and abd pain, and found to have pneumoperitoneum 9/15 from anastomotic leak.  He had ileocolonic resection, ileostomy, and wound vac.  He remained on vent post-op, and PCCM consulted.  Events Since Admission: 02/16/2012 >>Laparotomy 9/27>>return to OR for closure  SUBJECTIVE: Aefbrile, denies pain  Decreased confusion   Vital Signs: Temp:  [98.4 F (36.9 C)-99.8 F (37.7 C)] 99.3 F (37.4 C) (09/29 0743) Pulse Rate:  [71-161] 92  (09/29 0900) Resp:  [22-32] 32  (09/29 0900) BP: (130-160)/(55-78) 149/67 mmHg (09/29 0900) SpO2:  [93 %-100 %] 99 % (09/29 0900)  Physical Examination: General - calm ,denies pain HEENT - no jvd Cardiac - s1s2 regular, no murmur Chest - no wheeze, distant Bs Abd - soft, mildly distended, ileostomy and wound vac in place Ext - 1+  edema Neuro -much calmer, non focal   ASSESSMENT AND PLAN Principal Problem:  *Peritonitis Active Problems:  Type II diabetes mellitus, well controlled  OBSTRUCTIVE SLEEP APNEA  Obesity (BMI 30.0-34.9)  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 Acute pulmonary edema and increased lung water.>>improved with diuresis Hx of OSA.  9/28 decreased effusions P:   Extubated    CARDIOVASCULAR  Lab 02/24/12 0844 02/24/12 0225 02/23/12 2040  TROPONINI <0.30 <0.30 <0.30  LATICACIDVEN -- -- --  PROBNP -- -- --   Rt IJ CVL 9/15>> A line 02/16/2012 >>02/19/12  A: Septic shock from peritonitis>>resolved.  I>>>O +18L Hx of HTN. P:  Cont  Lasix  For equal balance   RENAL  Lab 03/01/12 0450 02/29/12 0545 02/28/12 1821 02/28/12 1600 02/28/12 0400 02/27/12 0426 02/24/12 0225  NA 137 137 135 129* 137 -- --  K 4.2 3.9 -- -- -- -- --  CL 102 101 99 94* 103 -- --  CO2 28 28 28 26 28  -- --  BUN 23 24* 21 20 19  -- --  CREATININE 1.03 1.15 0.98 0.92 0.91 -- --  CALCIUM 8.2* 8.2* 8.3* 8.4 8.4 -- --  MG 1.8 1.4* -- 1.6 1.2* 1.3* --  PHOS -- -- -- -- -- 3.0 3.6   Intake/Output      09/28 0701 - 09/29 0700 09/29 0701 - 09/30 0700   I.V. (mL/kg) 480 (4.8) 40 (0.4)   NG/GT     IV Piggyback 428.5 200   TPN 2640 215.8   Total Intake(mL/kg) 3548.5 (35.4) 455.8 (4.5)   Urine (mL/kg/hr) 2575 (1.1) 100 (0.2)   Drains 50    Stool 2150    Total Output 4775 100   Net -1226.5 +355.8         Foley: 02/16/2012   A:  Acute renal failure from shock>>RESOLVED  Metabolic acidosis>>resolved. Hypokalemia>>>repleted Hypocalcemia>IMPROVED Hypomagnesemia   P Monitor BMET daily Repleted Mg   GASTROINTESTINAL  Lab 02/27/12 0426 02/25/12 0508 02/24/12 0225  AST 35 36 36  ALT 21 25 18   ALKPHOS 111 97 77  BILITOT 1.3* 1.3* 2.0*  PROT 6.1 5.5* 5.3*  ALBUMIN 1.3* 1.2* 1.1*    A: Acute peritonitis 2nd to anastomotic leak s/p ex lap 9/15.  Wound closed 9/26.   ON TPN Nutrition for severe protein calorie malnutrition.  P:   Taper TPN Advance PO  HEMATOLOGIC  Lab 03/01/12 0450 02/29/12 0545 02/28/12 0400 02/27/12 0426 02/26/12 0415  HGB 9.0* 9.5* 10.7* 10.4* 7.1*  HCT 26.2* 26.9* 30.8* 30.7* 20.9*  PLT  395 431* 351 327 294  INR -- -- -- -- --  APTT -- -- -- -- --   A: Anemia of critical illness. P:  F/u CBC Transfuse for Hb < /= 7>>for two units PRBC 03/01/2012 for OR  INFECTIOUS  Lab 03/01/12 0450 02/29/12 0545 02/28/12 0400 02/27/12 0426 02/26/12 0415  WBC 10.9* 13.5* 11.9* 15.0* 8.6  PROCALCITON -- -- -- -- --   Cultures: Urine 9/15>>negative Blood 9/18>>neg Urine 9/18>>negative Sputum 9/18>>Candida  Antibiotics: Rocephin 9/15>>9/15 Ertapenem 9/15>>9/17 Zosyn (abd leak, peritonitis) 9/18>>Stop Date 9/29 Vancomycin (abd leak, peritonitis)9/18>> stop date 9/29 Diflucan (peritonitis, abd leak) 9/20>>stop date 9/29  A: Septic shock from peritonitis.  Procalcitonin trending down.  Recurrent fever 9/18 and 9/20>>improved with change in Abx and adding anti-fungal. P:   Abx as above to continue.  Plan 10days on all ABX total >>9/29  ENDOCRINE  Lab 03/01/12 1149 03/01/12 0741 03/01/12 0344 02/29/12 2326 02/29/12 1938  GLUCAP 152* 203* 178* 156* 185*   A: DM type II, CBGs high on TPN>>better with more insulin in TPN P:   SSI Insulin added to TPN   NEUROLOGIC  A:  Pain control good. P:   Haldol for agitation Fentanyl for pain  PT eval  PCCM to sign off, can mobilise & transfer out of ICU   Kara Mead MD. FCCP. [facility] Pager [phone] If no response call [phone]    03/01/2012, 12:29 PM

## 2012-03-01 NOTE — Progress Notes (Signed)
PARENTERAL NUTRITION CONSULT NOTE - Follow up  Pharmacy Consult:  TPN Indication: Prolonged ileus  No Known Allergies  Patient Measurements: Height: 5\' 11"  (180.3 cm) Weight: 221 lb 1.9 oz (100.3 kg) IBW/kg (Calculated) : 75.3  Ideal Body Weight: 80 Usual Weight: 117kg  Vital Signs: Temp: 99.3 F (37.4 C) (09/29 0743) Temp src: Oral (09/29 0743) BP: 152/67 mmHg (09/29 0600) Pulse Rate: 99  (09/29 0600) Intake/Output from previous day: 09/28 0701 - 09/29 0700 In: 3548.5 [I.V.:480; IV Piggyback:428.5; TPN:2640] Out: 4775 [Urine:2575; Drains:50; Stool:2150]  Labs:  Basename 03/01/12 0450 02/29/12 0545 02/28/12 0400  WBC 10.9* 13.5* 11.9*  HGB 9.0* 9.5* 10.7*  HCT 26.2* 26.9* 30.8*  PLT 395 431* 351  APTT -- -- --  INR -- -- --     Basename 03/01/12 0450 02/29/12 0545 02/28/12 1821 02/28/12 1600  NA 137 137 135 --  K 4.2 3.9 4.0 --  CL 102 101 99 --  CO2 28 28 28  --  GLUCOSE 233* 177* 128* --  BUN 23 24* 21 --  CREATININE 1.03 1.15 0.98 --  LABCREA -- -- -- --  CREAT24HRUR -- -- -- --  CALCIUM 8.2* 8.2* 8.3* --  MG 1.8 1.4* -- 1.6  PHOS -- -- -- --  PROT -- -- -- --  ALBUMIN -- -- -- --  AST -- -- -- --  ALT -- -- -- --  ALKPHOS -- -- -- --  BILITOT -- -- -- --  BILIDIR -- -- -- --  IBILI -- -- -- --  PREALBUMIN -- -- -- --  TRIG -- -- -- --  CHOLHDL -- -- -- --  CHOL -- -- -- --   Estimated Creatinine Clearance: 92 ml/min (by C-G formula based on Cr of 1.03).    Basename 03/01/12 0741 03/01/12 0344 02/29/12 2326  GLUCAP 203* 178* 156*      Insulin Requirements in the past 24 hours:  35 units SSI, 44 units in TPN  Current Nutrition:  TPN + clear liquid diet  Assessment: 61 year old man underwent hemicolectomy on 9/5 2/2 stage II colon CA and was discharged on 9/12.  He was readmitted 9/14 and underwent surgery for anastomatic leak and ileostomy on 9/15.  TPN initiated for prolonged ileus and prolonged fasting.  Patient started on clear liquid  diet yesterday and has not eaten anything.   Surgery increasing diet and decreasing TPN rate.  GI:  9/25 abd fascia closed. Distended abd with hypoactive BS. TFs stopped 9/27 with extubation. Drain O/P decreasing, stool O/P decreasing but remains significant Endo:  hx IDDM, CBGs elevated 154-203, on resistant SSI + insulin in TPN Lytes: all WNL Renal: SCr down 1.03, received Lasix, lytes WNL, good UOP, net negative I/O's ID:  Vanc/Zosyn D12 + fluconazole D10 for peritonitis 2/2 anastomotic leak.  Has been on antibiotics since 9/15.  Tmax 100.5, WBC down 10.9  Urine 9/15>>NG  Blood 9/18>>NG  Urine 9/18>>NG  Sputum 9/18>> few Candida  Heme/Onc: Stage II adenocarcinoma of the colon - hgb down 9, plts WNL Pulm: hx OSA. Extubated 9/27, now on RA Cards:  Hx HTN / HLD - BP trending up, HR mostly controlled now Hepatobil:  LFTs/Alk Phos wnl. Tbili slightly high at 1.3. Alb extremely low at 1.3 in the setting of ongoing inflammation with SIRS/sepsis/trips to OR. TG high at 198. Prealbumin very low at 4.0 with ongoing illness, expect this to increase with nutritional support Neuro: Fent/Versed prn, has had periods of agitation since admit  Best Practices: SCDs, MC, PPI TPN Access: PICC triple lumen placed 9/22 TPN day#: 8  Nutritional Goals:  1900-2000 kCal, 120-142 gm protein   Plan:  - Decrease Clinimix E 5/15 to 60 ml/hr (goal 161mL/hr) per Surgery.  New rate to meet ~50% of needs - Increase insulin in TPN to 50 units (proportionate increase due to decreasing rate of TPN) - Multivitamins, trace elements, and lipids will be added on MWF only due to national shortage. - F/U PO intake to wean off of TPN - F/U AM labs     Kynslee Baham D. Mina Marble, PharmD, BCPS Pager:  [phone] 03/01/2012, 8:37 AM

## 2012-03-01 NOTE — Progress Notes (Signed)
[facility]/[name] Progress Note 4 Days Post-Op  Subjective: The patient is complaining of severe pain in his feet.  Has bilateral lowere extremity swelling.  Objective: Vital signs in last 24 hours: Temp:  [98.4 F (36.9 C)-99.8 F (37.7 C)] 99.3 F (37.4 C) (09/29 0743) Pulse Rate:  [71-161] 99  (09/29 0600) Resp:  [22-32] 28  (09/29 0600) BP: (122-160)/(52-71) 152/67 mmHg (09/29 0600) SpO2:  [93 %-100 %] 99 % (09/29 0600) Last BM Date: 02/25/12 (ileostomy)  Intake/Output from previous day: 09/28 0701 - 09/29 0700 In: 3548.5 [I.V.:480; IV Piggyback:428.5; TPN:2640] Out: 4775 [Urine:2575; Drains:50; Stool:2150] Intake/Output this shift:    General: No acute distress.  Lungs: Clear  Abd: Wound VAC is intact and ileostomy is functioning well  Extremities: Swelling bilaterally  Neuro: Sluggish speech, but no focal findings.  Lab Results:  @LABLAST2 (wbc:2,hgb:2,hct:2,plt:2) BMET  Basename 03/01/12 0450 02/29/12 0545  NA 137 137  K 4.2 3.9  CL 102 101  CO2 28 28  GLUCOSE 233* 177*  BUN 23 24*  CREATININE 1.03 1.15  CALCIUM 8.2* 8.2*   PT/INR No results found for this basename: LABPROT:2,INR:2 in the last 72 hours ABG No results found for this basename: PHART:2,PCO2:2,PO2:2,HCO3:2 in the last 72 hours  Studies/Results: Dg Chest Port 1 View  02/29/2012  *RADIOLOGY REPORT*  Clinical Data: Atelectasis.  Pleural effusion.  PORTABLE CHEST - 1 VIEW  Comparison: Chest 02/28/2012.  Findings: Endotracheal tube and NG tube have been removed.  Right PICC remains in place.  Right greater right pleural effusions and airspace disease appear improved.  No pneumothorax.  Heart size upper normal.  IMPRESSION:  1.  Status post extubation and NG tube removal. 2.  Improved right greater than left pleural effusions and basilar airspace disease.   Original Report Authenticated By: Arvid Right. Luther Parody, M.D.     Anti-infectives: Anti-infectives     Start     Dose/Rate Route Frequency Ordered  Stop   02/26/12 0000   vancomycin (VANCOCIN) 1,250 mg in sodium chloride 0.9 % 250 mL IVPB  Status:  Discontinued        1,250 mg 166.7 mL/hr over 90 Minutes Intravenous Every 12 hours 02/25/12 1241 02/29/12 1118   02/22/12 0000   vancomycin (VANCOCIN) IVPB 1000 mg/200 mL premix  Status:  Discontinued        1,000 mg 200 mL/hr over 60 Minutes Intravenous Every 12 hours 02/21/12 0929 02/25/12 1241   02/21/12 1000   fluconazole (DIFLUCAN) IVPB 400 mg        400 mg 200 mL/hr over 60 Minutes Intravenous Every 24 hours 02/21/12 0929     02/20/12 1000   vancomycin (VANCOCIN) 1,500 mg in sodium chloride 0.9 % 500 mL IVPB        1,500 mg 250 mL/hr over 120 Minutes Intravenous Every 24 hours 02/19/12 0942 02/21/12 1246   02/19/12 1000  piperacillin-tazobactam (ZOSYN) IVPB 3.375 g       3.375 g 12.5 mL/hr over 240 Minutes Intravenous 3 times per day 02/19/12 0942     02/19/12 1000   vancomycin (VANCOCIN) 2,000 mg in sodium chloride 0.9 % 500 mL IVPB        2,000 mg 250 mL/hr over 120 Minutes Intravenous  Once 02/19/12 0942 02/19/12 1335   02/16/12 1615   ertapenem (INVANZ) 1 g in sodium chloride 0.9 % 50 mL IVPB        1 g 100 mL/hr over 30 Minutes Intravenous To Surgery 02/16/12 1606 02/16/12 1733   02/16/12  1600   ertapenem (INVANZ) 1 g in sodium chloride 0.9 % 50 mL IVPB  Status:  Discontinued        1 g 100 mL/hr over 30 Minutes Intravenous Every 24 hours 02/16/12 1508 02/19/12 0909   02/16/12 1415   cefTRIAXone (ROCEPHIN) 1 g in dextrose 5 % 50 mL IVPB        1 g 100 mL/hr over 30 Minutes Intravenous  Once 02/16/12 1407 02/16/12 1448          Assessment/Plan: s/p Procedure(s): EXPLORATORY LAPAROTOMY APPLICATION OF WOUND VAC Advance diet Will decrease TPN Start Neurontin for neuropathy in feet Duplex doppler studies for looking DVT   LOS: 14 days   Kathryne Eriksson. Dahlia Bailiff, MD, FACS [phone] [facility] Surgery 03/01/2012

## 2012-03-02 ENCOUNTER — Inpatient Hospital Stay (HOSPITAL_COMMUNITY): Payer: Medicaid Other

## 2012-03-02 DIAGNOSIS — H269 Unspecified cataract: Secondary | ICD-10-CM

## 2012-03-02 LAB — CBC
HCT: 26.7 % — ABNORMAL LOW (ref 39.0–52.0)
Hemoglobin: 9.5 g/dL — ABNORMAL LOW (ref 13.0–17.0)
MCH: 26.5 pg (ref 26.0–34.0)
MCHC: 35.6 g/dL (ref 30.0–36.0)
MCV: 74.6 fL — ABNORMAL LOW (ref 78.0–100.0)
Platelets: 419 10*3/uL — ABNORMAL HIGH (ref 150–400)
RBC: 3.58 MIL/uL — ABNORMAL LOW (ref 4.22–5.81)
RDW: 20.7 % — ABNORMAL HIGH (ref 11.5–15.5)
WBC: 11.7 10*3/uL — ABNORMAL HIGH (ref 4.0–10.5)

## 2012-03-02 LAB — GLUCOSE, CAPILLARY
Glucose-Capillary: 105 mg/dL — ABNORMAL HIGH (ref 70–99)
Glucose-Capillary: 124 mg/dL — ABNORMAL HIGH (ref 70–99)
Glucose-Capillary: 149 mg/dL — ABNORMAL HIGH (ref 70–99)
Glucose-Capillary: 154 mg/dL — ABNORMAL HIGH (ref 70–99)
Glucose-Capillary: 157 mg/dL — ABNORMAL HIGH (ref 70–99)
Glucose-Capillary: 182 mg/dL — ABNORMAL HIGH (ref 70–99)

## 2012-03-02 LAB — COMPREHENSIVE METABOLIC PANEL
ALT: 26 U/L (ref 0–53)
AST: 49 U/L — ABNORMAL HIGH (ref 0–37)
Albumin: 1.6 g/dL — ABNORMAL LOW (ref 3.5–5.2)
Alkaline Phosphatase: 230 U/L — ABNORMAL HIGH (ref 39–117)
BUN: 21 mg/dL (ref 6–23)
CO2: 28 mEq/L (ref 19–32)
Calcium: 8.3 mg/dL — ABNORMAL LOW (ref 8.4–10.5)
Chloride: 99 mEq/L (ref 96–112)
Creatinine, Ser: 1.01 mg/dL (ref 0.50–1.35)
GFR calc Af Amer: >90 mL/min (ref >90)
GFR calc non Af Amer: 79 mL/min — ABNORMAL LOW (ref >90)
Glucose, Bld: 126 mg/dL — ABNORMAL HIGH (ref 70–99)
Potassium: 4.1 mEq/L (ref 3.5–5.1)
Sodium: 134 mEq/L — ABNORMAL LOW (ref 135–145)
Total Bilirubin: 1.3 mg/dL — ABNORMAL HIGH (ref 0.3–1.2)
Total Protein: 7.3 g/dL (ref 6.0–8.3)

## 2012-03-02 LAB — DIFFERENTIAL
Basophils Absolute: 0 10*3/uL (ref 0.0–0.1)
Basophils Relative: 0 % (ref 0–1)
Eosinophils Absolute: 0.1 10*3/uL (ref 0.0–0.7)
Eosinophils Relative: 1 % (ref 0–5)
Lymphocytes Relative: 23 % (ref 12–46)
Lymphs Abs: 2.7 10*3/uL (ref 0.7–4.0)
Monocytes Absolute: 1.5 10*3/uL — ABNORMAL HIGH (ref 0.1–1.0)
Monocytes Relative: 13 % — ABNORMAL HIGH (ref 3–12)
Neutro Abs: 7.4 10*3/uL (ref 1.7–7.7)
Neutrophils Relative %: 63 % (ref 43–77)

## 2012-03-02 LAB — PREALBUMIN: Prealbumin: 9.1 mg/dL — ABNORMAL LOW (ref 17.0–34.0)

## 2012-03-02 LAB — HEMOGLOBIN A1C
Hgb A1c MFr Bld: 6.2 % — ABNORMAL HIGH (ref <5.7)
Mean Plasma Glucose: 131 mg/dL — ABNORMAL HIGH (ref <117)

## 2012-03-02 LAB — TRIGLYCERIDES: Triglycerides: 130 mg/dL (ref <150)

## 2012-03-02 LAB — MAGNESIUM: Magnesium: 1.7 mg/dL (ref 1.5–2.5)

## 2012-03-02 LAB — CHOLESTEROL, TOTAL: Cholesterol: 114 mg/dL (ref 0–200)

## 2012-03-02 LAB — PHOSPHORUS: Phosphorus: 3 mg/dL (ref 2.3–4.6)

## 2012-03-02 LAB — PROCALCITONIN: Procalcitonin: 0.23 ng/mL

## 2012-03-02 IMAGING — CR DG CHEST 1V PORT SAME DAY
1 series · 1 of 1 positions shown · non-contrast
Comparison: [DATE].

CLINICAL DATA: Cough and shortness of breath.

PORTABLE CHEST - 1 VIEW SAME DAY

[AP]
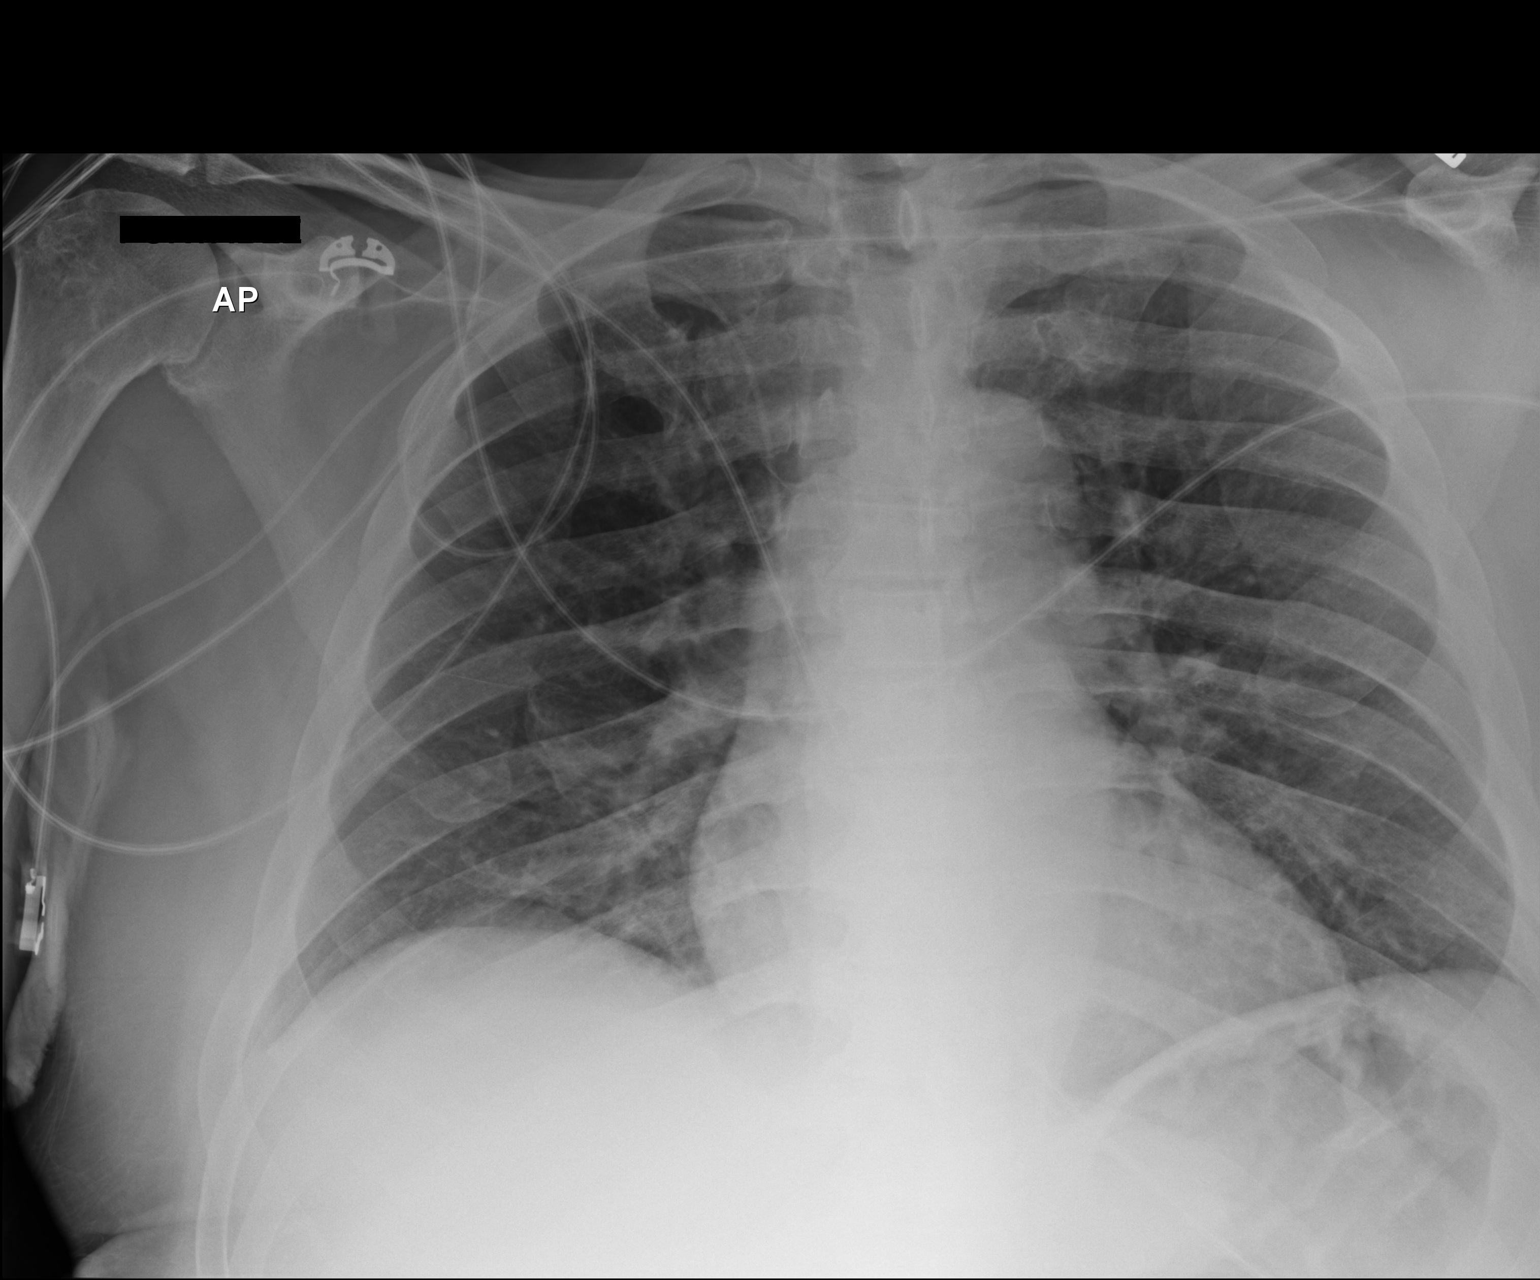

[1 of 1 positions shown; findings below may reference images not displayed]

FINDINGS: Improved inspiration.  The cardiac silhouette remains
borderline enlarged.  Improved aeration at the lung bases with
minimal residual atelectasis at the medial right lung base.  No
visible pleural fluid on this image.  Right PICC tip in the
inferior aspect of the superior vena cava.  Thoracic spine
degenerative changes.
IMPRESSION: 1.  Improved inspiration with resolved left basilar atelectasis and
almost completely resolved right basilar atelectasis.
2.  Right PICC tip in the inferior aspect of the superior vena
cava.  If a position at the cavoatrial junction is desired, it
would be recommended that this be advanced 2 cm.

## 2012-03-02 MED ORDER — PANTOPRAZOLE SODIUM 40 MG PO TBEC
40.0000 mg | DELAYED_RELEASE_TABLET | Freq: Every day | ORAL | Status: DC
  Administered 2012-03-02 – 2012-03-09 (×7): 40 mg via ORAL
  Filled 2012-03-02 (×7): qty 1

## 2012-03-02 MED ORDER — METOPROLOL TARTRATE 1 MG/ML IV SOLN
5.0000 mg | Freq: Four times a day (QID) | INTRAVENOUS | Status: DC | PRN
  Administered 2012-03-02 – 2012-03-04 (×5): 5 mg via INTRAVENOUS
  Filled 2012-03-02 (×6): qty 5

## 2012-03-02 MED ORDER — MAGNESIUM SULFATE 40 MG/ML IJ SOLN
2.0000 g | Freq: Once | INTRAMUSCULAR | Status: AC
  Administered 2012-03-02: 2 g via INTRAVENOUS
  Filled 2012-03-02: qty 50

## 2012-03-02 MED ORDER — ADULT MULTIVITAMIN W/MINERALS CH
1.0000 | ORAL_TABLET | Freq: Every day | ORAL | Status: DC
  Administered 2012-03-02 – 2012-03-10 (×9): 1 via ORAL
  Filled 2012-03-02 (×9): qty 1

## 2012-03-02 MED ORDER — FAT EMULSION 20 % IV EMUL
250.0000 mL | INTRAVENOUS | Status: AC
  Administered 2012-03-02: 250 mL via INTRAVENOUS
  Filled 2012-03-02: qty 250

## 2012-03-02 MED ORDER — INSULIN ASPART PROT & ASPART (70-30 MIX) 100 UNIT/ML ~~LOC~~ SUSP
10.0000 [IU] | Freq: Two times a day (BID) | SUBCUTANEOUS | Status: DC
  Administered 2012-03-02 – 2012-03-10 (×16): 10 [IU] via SUBCUTANEOUS
  Filled 2012-03-02 (×2): qty 3

## 2012-03-02 MED ORDER — INSULIN REGULAR HUMAN 100 UNIT/ML IJ SOLN
INTRAVENOUS | Status: AC
  Administered 2012-03-02: 18:00:00 via INTRAVENOUS
  Filled 2012-03-02: qty 2000

## 2012-03-02 NOTE — Progress Notes (Signed)
PARENTERAL NUTRITION CONSULT NOTE - Follow up  Pharmacy Consult:  TPN Indication: Prolonged ileus  No Known Allergies  Patient Measurements: Height: 5\' 11"  (180.3 cm) Weight: 221 lb 1.9 oz (100.3 kg) IBW/kg (Calculated) : 75.3  Ideal Body Weight: 80 Usual Weight: 117kg  Vital Signs: Temp: 98.5 F (36.9 C) (09/30 0800) Temp src: Oral (09/30 0800) BP: 181/66 mmHg (09/30 0900) Pulse Rate: 85  (09/30 0900) Intake/Output from previous day: 09/29 0701 - 09/30 0700 In: 2208.8 [P.O.:60; I.V.:460; IV Piggyback:210; TPN:1478.8] Out: 2975 [Urine:2725; Stool:250]  Labs:  [facility] 03/02/12 0359 03/01/12 0450 02/29/12 0545  WBC 11.7* 10.9* 13.5*  HGB 9.5* 9.0* 9.5*  HCT 26.7* 26.2* 26.9*  PLT 419* 395 431*  APTT -- -- --  INR -- -- --     Basename 03/02/12 0359 03/01/12 0450 02/29/12 0545  NA 134* 137 137  K 4.1 4.2 3.9  CL 99 102 101  CO2 28 28 28   GLUCOSE 126* 233* 177*  BUN 21 23 24*  CREATININE 1.01 1.03 1.15  LABCREA -- -- --  CREAT24HRUR -- -- --  CALCIUM 8.3* 8.2* 8.2*  MG 1.7 1.8 1.4*  PHOS 3.0 -- --  PROT 7.3 -- --  ALBUMIN 1.6* -- --  AST 49* -- --  ALT 26 -- --  ALKPHOS 230* -- --  BILITOT 1.3* -- --  BILIDIR -- -- --  IBILI -- -- --  PREALBUMIN -- -- --  TRIG 130 -- --  CHOLHDL -- -- --  CHOL 114 -- --   Estimated Creatinine Clearance: 93.8 ml/min (by C-G formula based on Cr of 1.01).    Basename 03/02/12 0758 03/02/12 0349 03/02/12 0012  GLUCAP 154* 124* 149*      Insulin Requirements in the past 24 hours:  30 units resistant SSI, 50 units in TPN, RN plans to request lantus from CCM service; range 124 - 219  Current Nutrition:  TPN + clear liquid diet  Assessment: 61 year old man underwent hemicolectomy on 9/5 2/2 stage II colon CA and was discharged on 9/12.  He was readmitted 9/14 and underwent surgery for anastomatic leak and ileostomy on 9/15.  TPN initiated for prolonged ileus and prolonged fasting.  Patient started on clear liquid  diet yesterday, per RN he does not have an appetite and PO intake is poor.    GI:  9/25 abd fascia closed. TFs stopped 9/27 with extubation. Not taking full liquids 2nd poor appetite. Swallowing OK per RN report.  Endo:  hx IDDM, CBGs 203, 152, 183, 219, 149, 124, 154, on resistant SSI + 50 units insulin in TPN Lytes:   mag 1.7, got 3 gm on 9/28, k 4.1, hyponatremia Na = 134  Renal: SCr down 1.01,  ID:  Vanc/Zosyn completed 9/29  + fluconazole completed 9/29 for peritonitis 2/2 anastomotic leak.   Urine 9/15>>NG  Blood 9/18>>NG  Urine 9/18>>NG  Sputum 9/18>> few Candida  Heme/Onc: Stage II adenocarcinoma of the colon - hgb down 9, plts WNL Pulm: hx OSA. Extubated 9/27, now on RA Cards:  Hx HTN / HLD   Hepatobil: Alk Phos up to 230. Tbili slightly high at 1.3. Alb extremely low at 1.6 in the setting of ongoing inflammation with SIRS/sepsis/trips to OR. TG down to 130. Prealbumin very low at 4.0 with ongoing illness, expect this to increase with nutritional support Neuro: Fent/Versed prn, has had periods of agitation since admit Best Practices: SCDs, MC, PPI TPN Access: PICC triple lumen placed 9/22 TPN day#:  9  Nutritional Goals:  1900-2000 kCal, 120-142 gm protein   Plan:  - Continue Clinimix E 5/15 at 60 ml/hr (goal 167mL/hr) per Surgery, rate decreased on 9/29 by Dr. Hulen Skains.  New rate to meet ~50% of needs - Increase insulin in TPN to 60 units  - Multivitamins changed to po 9/30.  -replace magnesium with 2 gram bolus -lipids will be added on MWF only due to national shortage. - F/U PO intake to wean off of TPN\ -change IV PPI to [place], Pharm.D. [id number] 03/02/2012 10:01 AM

## 2012-03-02 NOTE — Progress Notes (Signed)
Report called to RN and met at bedside.  Patient transferred to [id number] and attached to telemetry monitor with no complications.

## 2012-03-02 NOTE — Consult Note (Signed)
[facility] Medical Consultation  JELANI URVINA [id number] DOB: 01-31-1951 DOA: 02/16/2012 PCP: [name], MD   Requesting physician: Dr. [name] Date of consultation: 03/02/2012 Reason for consultation: Management of chronic medical conditions  Impression/Recommendations Principal Problem:  *Peritonitis - this is being managed by primary team - will continue to follow along  Active Problems:  Acute respiratory failure  - pt slightly febrile with worsening WBC count today and with tachypnea on exam - I will proceed with CXR evaluation and will likely need ABX coverage but will hold off for now until we get the results back - I will also check procalcitonin level and blood cultures    Anemia of chronic disease - Hg and Hct is stable and at pt's baseline - CBC in AM   Type II diabetes mellitus, well controlled - will check A1C - for now I will initiate insulin regimen as per home regimen - will hold off on Metformin for now   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 - colonoscopy finding but not apparently malignant   ARF (acute renal failure) - this is now resolved and creatinine is within normal limits   Protein-calorie malnutrition, severe - nutrition consultation   Hypomagnesemia - supplemented  - will check Mg level in AM  I will followup again tomorrow. Please contact me if I can be of assistance in the meanwhile. Thank you for this consultation.  Chief Complaint: Management of chronic medical conditions  HPI:  Patient is a 61 year old male who recently underwent colonoscopy by [name] and found to have 3-4 cm sessile mass in the distal transverse colon, consistent with high -grade dysplasia with no signs of malignancy. He is status post laparoscopic surgery by Dr. [name], right hemicolectomy with primary anastomosis (02/06/2012), but has developed postoperative renal failure in the setting of hypovolemia, ileus and was discharged  09/12. He required hospitalization 09/14 due to progressive abdominal pain, poor oral intake, nausea and vomiting, found to have large amount of pneumoperitoneum concerning for obstruction and perforation and has subsequently required laparoscopic exploration. Pt currently unable to provide detailed history due to feeling sick and this was mostly obtained from already existing records.   Review of Systems:  Review of Systems  Unable to obtain detailed review due to pt acute illness   Past Medical History  Diagnosis Date  . Hyperlipidemia   . Hypertension   . Heart murmur   . Diabetes mellitus     type 2 IDDM x 15 years  . Sleep apnea     does not wear CPAP now  . Colon polyp   . Arthritis     left knee  . 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 Colonoscopy   . Ganglion cyst excision 20 YRS AGO    RT ARM  . Partial colectomy 02/06/2012  . Partial colectomy 02/06/2012    Procedure: PARTIAL COLECTOMY;  Surgeon: Imogene Burn. [name], MD;  Location: [place];  Service: General;  Laterality: N/A;  right partial colectomy  . Colonoscopy 02/06/2012    Procedure: COLONOSCOPY;  Surgeon: Milus Banister, MD;  Location: [place];  Service: Endoscopy;;  . Laparotomy 02/16/2012    Procedure: EXPLORATORY LAPAROTOMY;  Surgeon: Zenovia Jarred, MD;  Location: [place];  Service: General;  Laterality: N/A;  Exploratory Laparotomy with resection of anastomosis  . Ileostomy 02/16/2012    Procedure: ILEOSTOMY;  Surgeon: Zenovia Jarred, MD;  Location: [place];  Service: General;  Laterality: N/A;  . Application of wound vac 02/16/2012    Procedure: APPLICATION OF WOUND VAC;  Surgeon: Zenovia Jarred, MD;  Location: [place];  Service: General;  Laterality: N/A;  . Laparotomy 02/18/2012    Procedure: EXPLORATORY LAPAROTOMY;  Surgeon: Odis Hollingshead, MD;  Location: [place];  Service: General;  Laterality: N/A;  exploratory laparotomy  change of abdominal vac  dressing  . Dressing change under anesthesia 02/18/2012    Procedure: DRESSING CHANGE UNDER ANESTHESIA;  Surgeon: Odis Hollingshead, MD;  Location: [place];  Service: General;  Laterality: N/A;  . Laparotomy 02/20/2012    Procedure: EXPLORATORY LAPAROTOMY;  Surgeon: Odis Hollingshead, MD;  Location: [place];  Service: General;  Laterality: N/A;  . Vacuum assisted closure change 02/20/2012    Procedure: ABDOMINAL VACUUM ASSISTED CLOSURE CHANGE;  Surgeon: Odis Hollingshead, MD;  Location: [place];  Service: General;;  . Laparotomy 02/23/2012    Procedure: EXPLORATORY LAPAROTOMY;  Surgeon: Joyice Faster. Cornett, MD;  Location: [place];  Service: General;  Laterality: N/A;  Irrigation and Debridement of abdominal wound with wound vac change with partial closure  . Incision and drainage of wound 02/23/2012    Procedure: IRRIGATION AND DEBRIDEMENT WOUND;  Surgeon: Joyice Faster. Cornett, MD;  Location: [place];  Service: General;  Laterality: N/A;  . Vacuum assisted closure change 02/23/2012    Procedure: ABDOMINAL VACUUM ASSISTED CLOSURE CHANGE;  Surgeon: Joyice Faster. Cornett, MD;  Location: [place];  Service: General;  Laterality: N/A;  . Laparotomy 02/26/2012    Procedure: EXPLORATORY LAPAROTOMY;  Surgeon: Imogene Burn. [name], MD;  Location: [place];  Service: General;  Laterality: N/A;     . Application of wound vac 02/26/2012    Procedure: APPLICATION OF WOUND VAC;  Surgeon: Imogene Burn. [name], MD;  Location: [place];  Service: General;  Laterality: N/A;   Social History:  reports that he has been smoking Cigarettes.  He has a 15 pack-year smoking history. He has never used smokeless tobacco. He reports that he drinks alcohol. He reports that he does not use illicit drugs.  No Known Allergies Family History  Problem Relation Age of Onset  . Diabetes Father   . Heart disease Father   . Colon cancer Neg Hx   . Diabetes Sister   . Heart disease Sister   . Heart disease Mother     Prior to Admission medications   Medication Sig  Start Date End Date Taking? Authorizing Provider  atorvastatin (LIPITOR) 20 MG tablet Take 20 mg by mouth at bedtime.   Yes Historical Provider, MD  carvedilol (COREG) 6.25 MG tablet Take 1 tablet (6.25 mg total) by mouth 2 (two) times daily with a meal. 02/13/12 02/12/13 Yes Matthew K. Tsuei, MD  insulin NPH-insulin regular (NOVOLIN 70/30) (70-30) 100 UNIT/ML injection Inject 10-30 Units into the skin 2 (two) times daily with a meal. Inject 30 units in the morning and 10 units at bedtime.   Yes Historical Provider, MD  metFORMIN (GLUCOPHAGE) 500 MG tablet Take 1,000 mg by mouth 2 (two) times daily with a meal.   Yes Historical Provider, MD   Physical Exam: Blood pressure 169/75, pulse 81, temperature 98.9 F (37.2 C), temperature source Oral, resp. rate 33, height 5\' 11"  (1.803 m), weight 100.3 kg (221 lb 1.9 oz), SpO2 100.00%. Filed Vitals:   03/02/12 1212 03/02/12 1300 03/02/12 1353 03/02/12 1402  BP:  180/56 172/77 169/75  Pulse:      Temp:  99.1 F (37.3 C)  98.9 F (37.2 C)   TempSrc: Oral  Oral   Resp:  28 33   Height:      Weight:      SpO2:   100%    Physical Exam  Constitutional: Appears somnolent and in mild distress due to shortness of breath HENT: Normocephalic. External right and left ear normal. Oropharynx is clear and moist.  Eyes: Conjunctivae and EOM are normal. PERRLA, no scleral icterus.  Neck: Normal ROM. Neck supple. No JVD. No tracheal deviation. No thyromegaly.  CVS: Tachycardic, regular rhythm, distant heart sounds, S1/S2 Pulmonary: Slightly tachypnea, decreased breath sounds at bases with rales Abdominal: Soft. BS +,  no distension, tenderness in epigastric area, no rebound or guarding.  Musculoskeletal: Normal range of motion. Trace bilateral pitting edema Lymphadenopathy: No lymphadenopathy noted, cervical, inguinal. Neuro: Somnolent. Normal reflexes, muscle tone coordination. No cranial nerve deficit. Skin: Skin is warm and dry. No rash noted. Not  diaphoretic. No erythema. No pallor.  Psychiatric: Unable to assess as pt not providing any answers   Labs on Admission:  Basic Metabolic Panel:  Lab [id number] 03/01/12 0450 02/29/12 0545 02/28/12 1821 02/28/12 1600 02/28/12 0400 02/27/12 0426  NA 134* 137 137 135 129* -- --  K 4.1 4.2 3.9 4.0 5.4* -- --  CL 99 102 101 99 94* -- --  CO2 28 28 28 28 26  -- --  GLUCOSE 126* 233* 177* 128* 696* -- --  BUN 21 23 24* 21 20 -- --  CREATININE 1.01 1.03 1.15 0.98 0.92 -- --  CALCIUM 8.3* 8.2* 8.2* 8.3* 8.4 -- --  MG 1.7 1.8 1.4* -- 1.6 1.2* --  PHOS 3.0 -- -- -- -- -- 3.0   Liver Function Tests:  Lab 03/02/12 0359 02/27/12 0426 02/25/12 0508  AST 49* 35 36  ALT 26 21 25   ALKPHOS 230* 111 97  BILITOT 1.3* 1.3* 1.3*  PROT 7.3 6.1 5.5*  ALBUMIN 1.6* 1.3* 1.2*   CBC:  Lab 03/02/12 0359 03/01/12 0450 02/29/12 0545 02/28/12 0400 02/27/12 0426  WBC 11.7* 10.9* 13.5* 11.9* 15.0*  NEUTROABS 7.4 -- -- -- --  HGB 9.5* 9.0* 9.5* 10.7* 10.4*  HCT 26.7* 26.2* 26.9* 30.8* 30.7*  MCV 74.6* 73.4* 73.1* 74.2* 75.1*  PLT 419* 395 431* 351 327   CBG:  Lab 03/02/12 1209 03/02/12 0758 03/02/12 0349 03/02/12 0012 03/01/12 1956  GLUCAP 182* 154* 124* 149* 219*    Radiological Exams on Admission: No results found.  EKG: no EKG available  Time spent: Over 30 minutes  Faye Ramsay [facility] Pager [phone]  If 7PM-7AM, please contact night-coverage [email or web address] Password [email or web address] 03/02/2012, 3:13 PM

## 2012-03-02 NOTE — Progress Notes (Addendum)
5 Days Post-Op  Subjective: Patient seems to be tolerating PO's well. Ileostomy output has slowed somewhat Productive cough  Objective: Vital signs in last 24 hours: Temp:  [98.5 F (36.9 C)-99.8 F (37.7 C)] 98.5 F (36.9 C) (09/30 0800) Pulse Rate:  [78-102] 91  (09/30 0700) Resp:  [23-35] 29  (09/30 0700) BP: (140-188)/(64-89) 166/73 mmHg (09/30 0700) SpO2:  [97 %-100 %] 98 % (09/30 0700) Last BM Date: 03/01/12  Intake/Output from previous day: 09/29 0701 - 09/30 0700 In: 2208.8 [P.O.:60; I.V.:460; IV Piggyback:210; TPN:1478.8] Out: 2975 [Urine:2725; Stool:250] Intake/Output this shift:    General appearance: alert, cooperative and no distress Resp: diminished breath sounds both bases GI: soft, non-tender; thicker ostomy output Midline vac with good seal  Lab Results:   Basename 03/02/12 0359 03/01/12 0450  WBC 11.7* 10.9*  HGB 9.5* 9.0*  HCT 26.7* 26.2*  PLT 419* 395   BMET  Basename 03/02/12 0359 03/01/12 0450  NA 134* 137  K 4.1 4.2  CL 99 102  CO2 28 28  GLUCOSE 126* 233*  BUN 21 23  CREATININE 1.01 1.03  CALCIUM 8.3* 8.2*   PT/INR No results found for this basename: LABPROT:2,INR:2 in the last 72 hours ABG No results found for this basename: PHART:2,PCO2:2,PO2:2,HCO3:2 in the last 72 hours  Studies/Results: No results found.  Anti-infectives: Anti-infectives     Start     Dose/Rate Route Frequency Ordered Stop   02/26/12 0000   vancomycin (VANCOCIN) 1,250 mg in sodium chloride 0.9 % 250 mL IVPB  Status:  Discontinued        1,250 mg 166.7 mL/hr over 90 Minutes Intravenous Every 12 hours 02/25/12 1241 02/29/12 1118   02/22/12 0000   vancomycin (VANCOCIN) IVPB 1000 mg/200 mL premix  Status:  Discontinued        1,000 mg 200 mL/hr over 60 Minutes Intravenous Every 12 hours 02/21/12 0929 02/25/12 1241   02/21/12 1000   fluconazole (DIFLUCAN) IVPB 400 mg  Status:  Discontinued        400 mg 200 mL/hr over 60 Minutes Intravenous Every 24 hours  02/21/12 0929 03/01/12 1232   02/20/12 1000   vancomycin (VANCOCIN) 1,500 mg in sodium chloride 0.9 % 500 mL IVPB        1,500 mg 250 mL/hr over 120 Minutes Intravenous Every 24 hours 02/19/12 0942 02/21/12 1246   02/19/12 1000   piperacillin-tazobactam (ZOSYN) IVPB 3.375 g  Status:  Discontinued        3.375 g 12.5 mL/hr over 240 Minutes Intravenous 3 times per day 02/19/12 0942 03/01/12 1232   02/19/12 1000   vancomycin (VANCOCIN) 2,000 mg in sodium chloride 0.9 % 500 mL IVPB        2,000 mg 250 mL/hr over 120 Minutes Intravenous  Once 02/19/12 0942 02/19/12 1335   02/16/12 1615   ertapenem (INVANZ) 1 g in sodium chloride 0.9 % 50 mL IVPB        1 g 100 mL/hr over 30 Minutes Intravenous To Surgery 02/16/12 1606 02/16/12 1733   02/16/12 1600   ertapenem (INVANZ) 1 g in sodium chloride 0.9 % 50 mL IVPB  Status:  Discontinued        1 g 100 mL/hr over 30 Minutes Intravenous Every 24 hours 02/16/12 1508 02/19/12 0909   02/16/12 1415   cefTRIAXone (ROCEPHIN) 1 g in dextrose 5 % 50 mL IVPB        1 g 100 mL/hr over 30 Minutes Intravenous  Once 02/16/12  1407 02/16/12 1448          Assessment/Plan: s/p Procedure(s) (LRB) with comments: EXPLORATORY LAPAROTOMY (N/A) -   APPLICATION OF WOUND VAC (N/A) Transfer to floor Physical therapy Advance diet Wean TNA off  LOS: 15 days    Bora Broner K. 03/02/2012

## 2012-03-03 DIAGNOSIS — J9600 Acute respiratory failure, unspecified whether with hypoxia or hypercapnia: Secondary | ICD-10-CM

## 2012-03-03 DIAGNOSIS — R5381 Other malaise: Secondary | ICD-10-CM

## 2012-03-03 LAB — BASIC METABOLIC PANEL
BUN: 19 mg/dL (ref 6–23)
CO2: 26 mEq/L (ref 19–32)
Calcium: 8.3 mg/dL — ABNORMAL LOW (ref 8.4–10.5)
Chloride: 100 mEq/L (ref 96–112)
Creatinine, Ser: 0.95 mg/dL (ref 0.50–1.35)
GFR calc Af Amer: >90 mL/min (ref >90)
GFR calc non Af Amer: 89 mL/min — ABNORMAL LOW (ref >90)
Glucose, Bld: 131 mg/dL — ABNORMAL HIGH (ref 70–99)
Potassium: 4.3 mEq/L (ref 3.5–5.1)
Sodium: 133 mEq/L — ABNORMAL LOW (ref 135–145)

## 2012-03-03 LAB — GLUCOSE, CAPILLARY
Glucose-Capillary: 144 mg/dL — ABNORMAL HIGH (ref 70–99)
Glucose-Capillary: 108 mg/dL — ABNORMAL HIGH (ref 70–99)
Glucose-Capillary: 128 mg/dL — ABNORMAL HIGH (ref 70–99)
Glucose-Capillary: 129 mg/dL — ABNORMAL HIGH (ref 70–99)
Glucose-Capillary: 122 mg/dL — ABNORMAL HIGH (ref 70–99)
Glucose-Capillary: 107 mg/dL — ABNORMAL HIGH (ref 70–99)

## 2012-03-03 LAB — MAGNESIUM: Magnesium: 1.8 mg/dL (ref 1.5–2.5)

## 2012-03-03 MED ORDER — CARVEDILOL 6.25 MG PO TABS
6.2500 mg | ORAL_TABLET | Freq: Two times a day (BID) | ORAL | Status: DC
  Administered 2012-03-03 – 2012-03-04 (×2): 6.25 mg via ORAL
  Filled 2012-03-03 (×4): qty 1

## 2012-03-03 MED ORDER — ENSURE COMPLETE PO LIQD
237.0000 mL | Freq: Two times a day (BID) | ORAL | Status: DC
  Administered 2012-03-03 – 2012-03-10 (×4): 237 mL via ORAL

## 2012-03-03 MED ORDER — INSULIN REGULAR HUMAN 100 UNIT/ML IJ SOLN
INTRAVENOUS | Status: DC
  Administered 2012-03-03: 18:00:00 via INTRAVENOUS
  Filled 2012-03-03: qty 2000

## 2012-03-03 NOTE — Progress Notes (Signed)
6 Days Post-Op  Subjective: Patient is now in step-down unit - resting comfortably Reports no pain Appreciate Internal Medicine consult to assist with management of his medical comorbidities Inpatient rehab consult pending  Objective: Vital signs in last 24 hours: Temp:  [98.7 F (37.1 C)-99.2 F (37.3 C)] 98.7 F (37.1 C) (10/01 0852) Pulse Rate:  [68-86] 68  (10/01 0852) Resp:  [23-38] 27  (10/01 0852) BP: (160-183)/(56-96) 160/73 mmHg (10/01 0852) SpO2:  [99 %-100 %] 100 % (10/01 0852) Last BM Date: 03/02/12  Intake/Output from previous day: 09/30 0701 - 10/01 0700 In: 2214 [I.V.:330; IV Piggyback:54; TPN:1080] Out: 2895 [Urine:2070; Drains:50; Stool:775] Intake/Output this shift: Total I/O In: -  Out: 550 [Urine:550]  WDWN in NAD Lungs - decreased breath sounds both bases CV - RRR Abd - soft, non-tender Ileostomy - pink; good output Midline incision - VAC changed yesterday - continue MWF changes  Lab Results:   [facility] 03/02/12 0359 03/01/12 0450  WBC 11.7* 10.9*  HGB 9.5* 9.0*  HCT 26.7* 26.2*  PLT 419* 395   BMET  Basename 03/03/12 0610 03/02/12 0359  NA 133* 134*  K 4.3 4.1  CL 100 99  CO2 26 28  GLUCOSE 131* 126*  BUN 19 21  CREATININE 0.95 1.01  CALCIUM 8.3* 8.3*   PT/INR No results found for this basename: LABPROT:2,INR:2 in the last 72 hours ABG No results found for this basename: PHART:2,PCO2:2,PO2:2,HCO3:2 in the last 72 hours  Studies/Results: Dg Chest Port 1v Same Day  03/02/2012  *RADIOLOGY REPORT*  Clinical Data: Cough and shortness of breath.  PORTABLE CHEST - 1 VIEW SAME DAY  Comparison: 02/29/2012.  Findings: Improved inspiration.  The cardiac silhouette remains borderline enlarged.  Improved aeration at the lung bases with minimal residual atelectasis at the medial right lung base.  No visible pleural fluid on this image.  Right PICC tip in the inferior aspect of the superior vena cava.  Thoracic spine degenerative changes.   IMPRESSION:  1.  Improved inspiration with resolved left basilar atelectasis and almost completely resolved right basilar atelectasis. 2.  Right PICC tip in the inferior aspect of the superior vena cava.  If a position at the cavoatrial junction is desired, it would be recommended that this be advanced 2 cm.   Original Report Authenticated By: Gerald Stabs, M.D.     Anti-infectives: Anti-infectives     Start     Dose/Rate Route Frequency Ordered Stop   02/26/12 0000   vancomycin (VANCOCIN) 1,250 mg in sodium chloride 0.9 % 250 mL IVPB  Status:  Discontinued        1,250 mg 166.7 mL/hr over 90 Minutes Intravenous Every 12 hours 02/25/12 1241 02/29/12 1118   02/22/12 0000   vancomycin (VANCOCIN) IVPB 1000 mg/200 mL premix  Status:  Discontinued        1,000 mg 200 mL/hr over 60 Minutes Intravenous Every 12 hours 02/21/12 0929 02/25/12 1241   02/21/12 1000   fluconazole (DIFLUCAN) IVPB 400 mg  Status:  Discontinued        400 mg 200 mL/hr over 60 Minutes Intravenous Every 24 hours 02/21/12 0929 03/01/12 1232   02/20/12 1000   vancomycin (VANCOCIN) 1,500 mg in sodium chloride 0.9 % 500 mL IVPB        1,500 mg 250 mL/hr over 120 Minutes Intravenous Every 24 hours 02/19/12 0942 02/21/12 1246   02/19/12 1000   piperacillin-tazobactam (ZOSYN) IVPB 3.375 g  Status:  Discontinued  3.375 g 12.5 mL/hr over 240 Minutes Intravenous 3 times per day 02/19/12 0942 03/01/12 1232   02/19/12 1000   vancomycin (VANCOCIN) 2,000 mg in sodium chloride 0.9 % 500 mL IVPB        2,000 mg 250 mL/hr over 120 Minutes Intravenous  Once 02/19/12 0942 02/19/12 1335   02/16/12 1615   ertapenem (INVANZ) 1 g in sodium chloride 0.9 % 50 mL IVPB        1 g 100 mL/hr over 30 Minutes Intravenous To Surgery 02/16/12 1606 02/16/12 1733   02/16/12 1600   ertapenem (INVANZ) 1 g in sodium chloride 0.9 % 50 mL IVPB  Status:  Discontinued        1 g 100 mL/hr over 30 Minutes Intravenous Every 24 hours 02/16/12 1508  02/19/12 0909   02/16/12 1415   cefTRIAXone (ROCEPHIN) 1 g in dextrose 5 % 50 mL IVPB        1 g 100 mL/hr over 30 Minutes Intravenous  Once 02/16/12 1407 02/16/12 1448          Assessment/Plan: s/p Procedure(s) (LRB) with comments: EXPLORATORY LAPAROTOMY (N/A) -   APPLICATION OF WOUND VAC (N/A) Advance diet Wean TNA as PO intake improves Physical therapy Inpatient rehab consult   LOS: 16 days    Magie Ciampa K. 03/03/2012

## 2012-03-03 NOTE — Progress Notes (Signed)
PARENTERAL NUTRITION CONSULT NOTE - Follow up  Pharmacy Consult:  TPN Indication: Prolonged ileus  No Known Allergies  Patient Measurements: Height: 5\' 11"  (180.3 cm) Weight: 221 lb 1.9 oz (100.3 kg) IBW/kg (Calculated) : 75.3  Ideal Body Weight: 80 Usual Weight: 117kg  Vital Signs: Temp: 98.7 F (37.1 C) (10/01 0852) Temp src: Axillary (10/01 0852) BP: 160/73 mmHg (10/01 0852) Pulse Rate: 68  (10/01 0852) Intake/Output from previous day: 09/30 0701 - 10/01 0700 In: 2214 [I.V.:330; IV Piggyback:54; TPN:1080] Out: 2895 [Urine:2070; Drains:50; Stool:775]  Labs:  [facility] 03/02/12 0359 03/01/12 0450  WBC 11.7* 10.9*  HGB 9.5* 9.0*  HCT 26.7* 26.2*  PLT 419* 395  APTT -- --  INR -- --     Basename 03/03/12 0610 03/02/12 0359 03/01/12 0450  NA 133* 134* 137  K 4.3 4.1 4.2  CL 100 99 102  CO2 26 28 28   GLUCOSE 131* 126* 233*  BUN 19 21 23   CREATININE 0.95 1.01 1.03  LABCREA -- -- --  CREAT24HRUR -- -- --  CALCIUM 8.3* 8.3* 8.2*  MG 1.8 1.7 1.8  PHOS -- 3.0 --  PROT -- 7.3 --  ALBUMIN -- 1.6* --  AST -- 49* --  ALT -- 26 --  ALKPHOS -- 230* --  BILITOT -- 1.3* --  BILIDIR -- -- --  IBILI -- -- --  PREALBUMIN -- 9.1* --  TRIG -- 130 --  CHOLHDL -- -- --  CHOL -- 114 --   Estimated Creatinine Clearance: 99.8 ml/min (by C-G formula based on Cr of 0.95).    Basename 03/03/12 0802 03/03/12 0420 03/03/12 0018  GLUCAP 128* 108* 144*      Insulin Requirements in the past 24 hours:   50 units in TPN, Novolog 70/30 10 units BID added 9/30 PM by [facility], CBGs after first dose of 70/30 = 144, 108, 128  Current Nutrition:  TPN + clear liquid diet - advanced to carb modified this AM  Assessment: 62 year old man underwent hemicolectomy on 9/5 2/2 stage II colon CA and was discharged on 9/12.  He was readmitted 9/14 and underwent surgery for anastomatic leak and ileostomy on 9/15.  TPN initiated for prolonged ileus and prolonged fasting.  Patient started on clear  liquid diet Sunday and advanced to carb modified this morning. No po intake was recorded in I/O for 9/30. Poor appetite per RN.  Prealbumin up to 9.1 from 4 - good response to nutrition support vs clinically improving condition.    GI:  9/25 abd fascia closed. TFs stopped 9/27 with extubation. poor appetite per RN 9/30. Diet adv to carb mod this AM.  Endo:  hx IDDM, on resistant SSI + 50 units insulin in TPN, Novolog 70/30 10 units BID added 9/30 PM, home dose was 30 in am and 10 in PM.  CBGs 144, 108, 128 after first dose of 70/30 Lytes:   mag only up to 1.8 from 1.7 after 2 gm bolus yesterday,  got 3 gm on 9/28, k 4.3, hyponatremia continues with  Na = 133  Renal: SCr down 0.95  ID:  Vanc/Zosyn completed 9/29  + fluconazole completed 9/29 for peritonitis 2/2 anastomotic leak.   Urine 9/15>>NG  Blood 9/18>>NG  Urine 9/18>>NG  Sputum 9/18>> few Candida  Heme/Onc: Stage II adenocarcinoma of the colon - hgb down 9.5, plts WNL Pulm: hx OSA. Extubated 9/27, now on RA Cards:  Hx HTN / HLD   Hepatobil: Alk Phos up to 230. Tbili  slightly high at 1.3. Alb extremely low at 1.6 in the setting of ongoing inflammation with SIRS/sepsis/trips to OR. TG down to 130. Prealbumin very low at 4.0 with ongoing illness, expect this to increase with nutritional support Neuro: Fent/Versed prn, has had periods of agitation since admit Best Practices: SCDs, MC, PPI TPN Access: PICC triple lumen placed 9/22 TPN day#: 10  Nutritional Goals:  1900-2000 kCal, 120-142 gm protein   Plan:  - Continue Clinimix E 5/15 at 60 ml/hr (goal 147mL/hr) x 1 more day.  Rate decreased on 9/29 by Dr. Hulen Skains.  This rate to meet ~50% of needs.  Consider cutting rate to 43 ml/hr vs stopping on 10/2 if po intake adequate. -continue insulin in TPN 60 units - watch with new 70/30  - Multivitamins changed to po 9/30.  -lipids will be added on MWF only due to national shortage. - F/U PO intake to wean off of TPN

## 2012-03-03 NOTE — Progress Notes (Signed)
[facility] PROGRESS NOTE  [name] [id number] DOB: 08/04/1950 DOA: 02/16/2012 PCP: [name], MD  Assessment/Plan Peritonitis  - this is being managed by primary team  - we are consulting for medical issues  Acute respiratory failure  No acute finding for this- he currently has a mild stridor which is much worse when he is asleep- I suspect this may be vocal cord edema due to the recent intubation (12 days).  PER RN, he tends to develop tachypnea and then an increase in his stridor but never becomes hypoxic and never complains of difficulty breathing.    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   ARF (acute renal failure)  - this is now resolved and creatinine is within normal limits   Protein-calorie malnutrition, severe  - nutrition consultation   Hypomagnesemia  - supplemented     Brief narrative: Patient is a 61 year old male who recently underwent colonoscopy by [name] and was found to have 3-4 cm sessile mass in the distal transverse colon consistent with high -grade dysplasia with no signs of malignancy. He is status post laparoscopic surgery by Dr. [name], right hemicolectomy with primary anastomosis (02/06/2012), but developed postoperative renal failure in the setting of hypovolemia and an ileus and was discharged 09/12.  He required hospitalization 09/15 due to progressive abdominal pain, poor oral intake, nausea and vomiting, found to have large pneumoperitoneum due to an anastomotic leak.   HPI/Subjective: Alert but appears mildly confused- holding his fork backward  and attempting to eat. Has not complaints.   Objective: Filed Vitals:   03/03/12 0400 03/03/12 0852 03/03/12 1000 03/03/12 1137  BP: 169/85 160/73 177/77 154/75  Pulse: 79 68 86 66  Temp: 99.2 F (37.3 C) 98.7 F (37.1 C)  98.1 F (36.7 C)  TempSrc: Oral Axillary  Axillary  Resp: 27 27 23 31   Height:      Weight:      SpO2: 100% 100% 100% 100%    Intake/Output Summary (Last 24 hours) at 03/03/12 1551 Last data filed at 03/03/12 1300  Gross per 24 hour  Intake   2190 ml  Output   2525 ml  Net   -335 ml   Filed Weights   02/26/12 0700 02/28/12 0500 02/29/12 0400  Weight: 112.3 kg (247 lb 9.2 oz) 109.4 kg (241 lb 2.9 oz) 100.3 kg (221 lb 1.9 oz)    Exam:   General:  Alert, no acute distress  Cardiovascular: RRR, no murmurs  Respiratory: CTA b/l - mild stridor and upper airway congestion  Abdomen: soft, vertical wound vac dressing in place. BS+   Data Reviewed: Basic Metabolic Panel:  Lab [id number] 03/02/12 0359 03/01/12 0450 02/29/12 0545 02/28/12 1821 02/28/12 1600 02/27/12 0426  NA 133* 134* 137 137 135 -- --  K 4.3 4.1 4.2 3.9 4.0 -- --  CL 100 99 102 101 99 -- --  CO2 26 28 28 28 28  -- --  GLUCOSE 131* 126* 233* 177* 128* -- --  BUN 19 21 23  24* 21 -- --  CREATININE 0.95 1.01 1.03 1.15  0.98 -- --  CALCIUM 8.3* 8.3* 8.2* 8.2* 8.3* -- --  MG 1.8 1.7 1.8 1.4* -- 1.6 --  PHOS -- 3.0 -- -- -- -- 3.0   Liver Function Tests:  Lab 03/02/12 0359 02/27/12 0426  AST 49* 35  ALT 26 21  ALKPHOS 230* 111  BILITOT 1.3* 1.3*  PROT 7.3 6.1  ALBUMIN 1.6* 1.3*   No results found for this basename: LIPASE:5,AMYLASE:5 in the last 168 hours No results found for this basename: AMMONIA:5 in the last 168 hours CBC:  Lab 03/02/12 0359 03/01/12 0450 02/29/12 0545 02/28/12 0400 02/27/12 0426  WBC 11.7* 10.9* 13.5* 11.9* 15.0*  NEUTROABS 7.4 -- -- -- --  HGB 9.5* 9.0* 9.5* 10.7* 10.4*  HCT 26.7* 26.2* 26.9* 30.8* 30.7*  MCV 74.6* 73.4* 73.1* 74.2* 75.1*  PLT 419*  395 431* 351 327   Cardiac Enzymes: No results found for this basename: CKTOTAL:5,CKMB:5,CKMBINDEX:5,TROPONINI:5 in the last 168 hours BNP (last 3 results)  Basename 02/21/12 0839  PROBNP 295.9*   CBG:  Lab 03/03/12 1217 03/03/12 0802 03/03/12 0420 03/03/12 0018 03/02/12 1956  GLUCAP 129* 128* 108* 144* 105*    No results found for this or any previous visit (from the past 240 hour(s)).   Studies: Dg Chest Port 1v Same Day  03/02/2012  *RADIOLOGY REPORT*  Clinical Data: Cough and shortness of breath.  PORTABLE CHEST - 1 VIEW SAME DAY  Comparison: 02/29/2012.  Findings: Improved inspiration.  The cardiac silhouette remains borderline enlarged.  Improved aeration at the lung bases with minimal residual atelectasis at the medial right lung base.  No visible pleural fluid on this image.  Right PICC tip in the inferior aspect of the superior vena cava.  Thoracic spine degenerative changes.  IMPRESSION:  1.  Improved inspiration with resolved left basilar atelectasis and almost completely resolved right basilar atelectasis. 2.  Right PICC tip in the inferior aspect of the superior vena cava.  If a position at the cavoatrial junction is desired, it would be recommended that this be advanced 2 cm.   Original Report Authenticated By: Gerald Stabs, M.D.     Scheduled Meds:   . carvedilol  6.25 mg Oral BID WC  . feeding supplement  237 mL Oral BID BM  . gabapentin  300 mg Oral BID  . insulin aspart  0-20 Units Subcutaneous Q4H  . insulin aspart protamine-insulin aspart  10 Units Subcutaneous BID WC  . multivitamin with minerals  1 tablet Oral Daily  . pantoprazole  40 mg Oral QHS  . sodium chloride  10-40 mL Intracatheter Q12H  . DISCONTD: furosemide  40 mg Intravenous Daily   Continuous Infusions:   . sodium chloride 20 mL/hr (02/29/12 [id number])  . fat emulsion 250 mL (03/03/12 0700)  . TPN (CLINIMIX) +/- additives 60 mL/hr at 03/01/12 1803  . TPN (CLINIMIX) +/- additives 60 mL/hr at  03/02/12 1824  . 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

## 2012-03-03 NOTE — Progress Notes (Signed)
Nutrition Follow-up  Intervention:   1.  Supplements; Ensure Complete TID between meals 2.  Modify diet; recommend liberalization to CHO Mod High.   Assessment:   Pt awakens easily, however is difficult to understand.  Pt able to answer yes or no questions by nodding.  Pt consumed 50% of full liquid breakfast this am, and diet has been advanced to CHO Modified for this afternoon.  Pt is deconditioned, considering CIR.  Pt continues TNA for prolonged ileus, but has been decreased toClinimix E 5/15 @ 60 ml/hr.  Lipids (20% IVFE @ 10 ml/hr), multivitamins, and trace elements are provided 3 times weekly (MWF) due to national backorder.  Provides 1228 kcal and 72 grams protein daily (based on weekly average).  Meets 64% minimum estimated kcal and 60% minimum estimated protein needs.  Diet Order:  CHO Mod Med  Meds: Scheduled Meds:   . carvedilol  6.25 mg Oral BID WC  . gabapentin  300 mg Oral BID  . insulin aspart  0-20 Units Subcutaneous Q4H  . insulin aspart protamine-insulin aspart  10 Units Subcutaneous BID WC  . magnesium sulfate 1 - 4 g bolus IVPB  2 g Intravenous Once  . multivitamin with minerals  1 tablet Oral Daily  . pantoprazole  40 mg Oral QHS  . sodium chloride  10-40 mL Intracatheter Q12H  . DISCONTD: furosemide  40 mg Intravenous Daily   Continuous Infusions:   . sodium chloride 20 mL/hr (02/29/12 [id number])  . fat emulsion 250 mL (03/03/12 0700)  . TPN (CLINIMIX) +/- additives 60 mL/hr at 03/01/12 1803  . TPN (CLINIMIX) +/- additives 60 mL/hr at 03/02/12 1824  . TPN (CLINIMIX) +/- additives     PRN Meds:.fentaNYL, haloperidol lactate, metoprolol, sodium chloride  Labs:  CMP     Component Value Date/Time   NA 133* 03/03/2012 0610   K 4.3 03/03/2012 0610   CL 100 03/03/2012 0610   CO2 26 03/03/2012 0610   GLUCOSE 131* 03/03/2012 0610   BUN 19 03/03/2012 0610   CREATININE 0.95 03/03/2012 0610   CREATININE 1.12 07/04/2011 1516   CALCIUM 8.3* 03/03/2012 0610   PROT 7.3  03/02/2012 0359   ALBUMIN 1.6* 03/02/2012 0359   AST 49* 03/02/2012 0359   ALT 26 03/02/2012 0359   ALKPHOS 230* 03/02/2012 0359   BILITOT 1.3* 03/02/2012 0359   GFRNONAA 89* 03/03/2012 0610   GFRAA >90 03/03/2012 0610   CBC    Component Value Date/Time   WBC 11.7* 03/02/2012 0359   RBC 3.58* 03/02/2012 0359   HGB 9.5* 03/02/2012 0359   HCT 26.7* 03/02/2012 0359   PLT 419* 03/02/2012 0359   MCV 74.6* 03/02/2012 0359   MCH 26.5 03/02/2012 0359   MCHC 35.6 03/02/2012 0359   RDW 20.7* 03/02/2012 0359   LYMPHSABS 2.7 03/02/2012 0359   MONOABS 1.5* 03/02/2012 0359   EOSABS 0.1 03/02/2012 0359   BASOSABS 0.0 03/02/2012 0359    CBG (last 3)   Basename 03/03/12 1217 03/03/12 0802 03/03/12 0420  GLUCAP 129* 128* 108*     Intake/Output Summary (Last 24 hours) at 03/03/12 1406 Last data filed at 03/03/12 1300  Gross per 24 hour  Intake   2190 ml  Output   2525 ml  Net   -335 ml    Weight Status:  221 lbs, trending down, affected by fluid status  Re-estimated needs:  1900-2000 kcal, 120-142g protein  Nutrition Dx:  Inadequate oral intake, ongoing.  Monitor:   1.  Parenteral nutrition; continued  tolerance and initiation on weaning 2.  Food/Beverage; pt with tolerance of PO diet.  Pt to consume >50% of meals and supplements 3.  Wt/wt change; monitor trends.   Brynda Greathouse, MS RD LDN Clinical Inpatient Dietitian Pager: [phone] Weekend/After hours pager: [phone]

## 2012-03-03 NOTE — Consult Note (Signed)
Physical Medicine and Rehabilitation Consult Reason for Consult: deconditioning Referring Physician:  Dr. [name]   HPI: [name] is a 61 y.o. male smoker with DM, OSA, obesity; who underwent colonoscopy by [name] and was found to have a 3-4 cm sessile mass the distal transverse colon. 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 On 02/06/12 underwent laparoscopic-assisted mobilization splenic flexure, right hemicolectomy with primary anastomosis. Postop had acute renal failure with postoperative hypovolemia, which resolved. He also had a postop ileus. He was discharged on 02/13/12. He had some difficulty getting out of bed before discharge became worse on Friday, 02/14/12. He was unable to eat more than a few bites. Saturday 02/15/12, he had increasing abdominal pain ongoing nausea some vomiting again he was unable to eat. T  On 02/16/12 he presented with large amount of pneumoperitoneum and taken to OR for exp lap with resection of ileocolonic anastomosis with ileostomy and application of wound VAC. Treated for sepsis due to peritonitis and CCM consulted for vent management. Has required haldol due to agitation. Taken back to OR for wound closure attempt on 09/17, 09/19, 09/22, 09/25. On TNA due to prolonged ileus. Metabolic acidosis with acute renal failure resolving. Extubated on 02/28/12 and remains mildly confused. Developed LE edema and doppler done negative for DVT. PT evaluation done yesterday and patient noted to be severely deconditioned.  MD, therapy recommending CIR.  Review of Systems  Unable to perform ROS: mental acuity   Past Medical History  Diagnosis Date  . Hyperlipidemia   . Hypertension   . Heart murmur   . Diabetes mellitus     type 2 IDDM x 15 years  . Sleep apnea     does not wear CPAP now  . Colon polyp   . Arthritis     left knee  . 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 Colonoscopy     . Ganglion cyst excision 20 YRS AGO    RT ARM  . Partial colectomy 02/06/2012  . Partial colectomy 02/06/2012    Procedure: PARTIAL COLECTOMY;  Surgeon: Imogene Burn. [name], MD;  Location: [place];  Service: General;  Laterality: N/A;  right partial colectomy  . Colonoscopy 02/06/2012    Procedure: COLONOSCOPY;  Surgeon: Milus Banister, MD;  Location: [place];  Service: Endoscopy;;  . Laparotomy 02/16/2012    Procedure: EXPLORATORY LAPAROTOMY;  Surgeon: Zenovia Jarred, MD;  Location: [place];  Service: General;  Laterality: N/A;  Exploratory Laparotomy with resection of anastomosis  . Ileostomy 02/16/2012    Procedure: ILEOSTOMY;  Surgeon: Zenovia Jarred, MD;  Location: [place];  Service: General;  Laterality: N/A;  . Application of wound vac 02/16/2012    Procedure: APPLICATION OF WOUND VAC;  Surgeon: Zenovia Jarred, MD;  Location: [place];  Service: General;  Laterality: N/A;  . Laparotomy 02/18/2012    Procedure: EXPLORATORY LAPAROTOMY;  Surgeon: Odis Hollingshead, MD;  Location: [place];  Service: General;  Laterality: N/A;  exploratory laparotomy  change of abdominal vac dressing  . Dressing change under anesthesia 02/18/2012    Procedure: DRESSING CHANGE UNDER ANESTHESIA;  Surgeon: Odis Hollingshead, MD;  Location: [place];  Service: General;  Laterality: N/A;  . Laparotomy 02/20/2012    Procedure: EXPLORATORY LAPAROTOMY;  Surgeon: Odis Hollingshead, MD;  Location: [place];  Service: General;  Laterality: N/A;  . Vacuum assisted closure change 02/20/2012    Procedure:  ABDOMINAL VACUUM ASSISTED CLOSURE CHANGE;  Surgeon: Odis Hollingshead, MD;  Location: [place];  Service: General;;  . Laparotomy 02/23/2012    Procedure: EXPLORATORY LAPAROTOMY;  Surgeon: Joyice Faster. Cornett, MD;  Location: [place];  Service: General;  Laterality: N/A;  Irrigation and Debridement of abdominal wound with wound vac change with partial closure  . Incision and drainage of wound 02/23/2012    Procedure: IRRIGATION AND DEBRIDEMENT WOUND;   Surgeon: Joyice Faster. Cornett, MD;  Location: [place];  Service: General;  Laterality: N/A;  . Vacuum assisted closure change 02/23/2012    Procedure: ABDOMINAL VACUUM ASSISTED CLOSURE CHANGE;  Surgeon: Joyice Faster. Cornett, MD;  Location: [place];  Service: General;  Laterality: N/A;  . Laparotomy 02/26/2012    Procedure: EXPLORATORY LAPAROTOMY;  Surgeon: Imogene Burn. [name], MD;  Location: [place];  Service: General;  Laterality: N/A;     . Application of wound vac 02/26/2012    Procedure: APPLICATION OF WOUND VAC;  Surgeon: Imogene Burn. [name], MD;  Location: [place];  Service: General;  Laterality: N/A;   Family History  Problem Relation Age of Onset  . Diabetes Father   . Heart disease Father   . Colon cancer Neg Hx   . Diabetes Sister   . Heart disease Sister   . Heart disease Mother     Social History:  Lives alone. Daughter reportedly staying with patient.  reports that he has been smoking Cigarettes.  He has a 15 pack-year smoking history. He has never used smokeless tobacco. He reports that he drinks alcohol. He reports that he does not use illicit drugs.  Allergies: No Known Allergies  Medications Prior to Admission  Medication Sig Dispense Refill  . atorvastatin (LIPITOR) 20 MG tablet Take 20 mg by mouth at bedtime.      . carvedilol (COREG) 6.25 MG tablet Take 1 tablet (6.25 mg total) by mouth 2 (two) times daily with a meal.  60 tablet  0  . insulin NPH-insulin regular (NOVOLIN 70/30) (70-30) 100 UNIT/ML injection Inject 10-30 Units into the skin 2 (two) times daily with a meal. Inject 30 units in the morning and 10 units at bedtime.      . metFORMIN (GLUCOPHAGE) 500 MG tablet Take 1,000 mg by mouth 2 (two) times daily with a meal.      . oxyCODONE-acetaminophen (PERCOCET/ROXICET) 5-325 MG per tablet Take 1-2 tablets by mouth every 6 (six) hours as needed.  30 tablet  0    Home: Home Living Lives With: Alone;Daughter (Daughter staying with him while he is sick) Available Help at Discharge:  Family Type of Home: House Home Access: Stairs to enter Technical brewer of Steps: 5 Entrance Stairs-Rails: Right;Left;Can reach both Home Layout: One level Bathroom Shower/Tub: Walk-in shower;Tub/shower unit Home Adaptive Equipment: Walker - rolling  Functional History: Prior Function Able to Take Stairs?: Reciprically Driving: Yes Vocation: Part time employment Functional Status:  Mobility: Bed Mobility Bed Mobility: Rolling Left;Left Sidelying to Sit;Sit to Supine;Sitting - Scoot to Edge of Bed Rolling Left: 1: +2 Total assist;With rail Rolling Left: Patient Percentage: 50% Left Sidelying to Sit: 1: +2 Total assist;With rails Left Sidelying to Sit: Patient Percentage: 40% Sitting - Scoot to Edge of Bed: 1: +2 Total assist Sitting - Scoot to Edge of Bed: Patient Percentage: 40% Sit to Supine: 1: +2 Total assist Sit to Supine: Patient Percentage: 20% Transfers Transfers: Not assessed      ADL:    Cognition: Cognition Arousal/Alertness: Lethargic Orientation Level: Oriented  to person;Disoriented to time;Disoriented to situation;Oriented to place Cognition Overall Cognitive Status: Impaired Area of Impairment: Attention;Safety/judgement;Problem solving Arousal/Alertness: Lethargic Orientation Level: Appears intact for tasks assessed Behavior During Session: Lethargic Current Attention Level: Sustained Safety/Judgement: Decreased safety judgement for tasks assessed  Blood pressure 160/73, pulse 68, temperature 98.7 F (37.1 C), temperature source Axillary, resp. rate 27, height 5\' 11"  (1.803 m), weight 100.3 kg (221 lb 1.9 oz), SpO2 100.00%. Physical Exam  Nursing note and vitals reviewed. Constitutional: He appears lethargic. He appears toxic.  HENT:  Head: Normocephalic.  Neck: Normal range of motion.  Cardiovascular: Normal rate and regular rhythm.   Pulmonary/Chest:       tachypnea with rhonchi in upper airway.   Abdominal: Soft. He exhibits no  distension. Bowel sounds are decreased. There is no tenderness.       Midline incision with VAC in place. Ostomy intact  Musculoskeletal: He exhibits edema (1+ pedal bilaterally.).  Neurological: He appears lethargic.       Lethargic. Oriented to self and situation "colon" when aroused.  Wet voice with dysarthric, confused speech. Moves all four with grossly 3-4/5 strength. No focal sensory deficits  Psychiatric:       Fair insight and awareness. Decreased STM. Affect generally pleasant    PROCALCITONIN     Status: Normal   Collection Time   03/02/12  7:14 PM      Component Value Range   Procalcitonin 0.23    GLUCOSE, CAPILLARY     Status: Abnormal   Collection Time   03/02/12  7:56 PM      Component Value Range   Glucose-Capillary 105 (*) 70 - 99 mg/dL  GLUCOSE, CAPILLARY     Status: Abnormal   Collection Time   03/03/12 12:18 AM      Component Value Range   Glucose-Capillary 144 (*) 70 - 99 mg/dL  GLUCOSE, CAPILLARY     Status: Abnormal   Collection Time   03/03/12  4:20 AM      Component Value Range   Glucose-Capillary 108 (*) 70 - 99 mg/dL   Comment 1 Documented in Chart     Comment 2 Notify RN    BASIC METABOLIC PANEL     Status: Abnormal   Collection Time   03/03/12  6:10 AM      Component Value Range   Sodium 133 (*) 135 - 145 mEq/L   Potassium 4.3  3.5 - 5.1 mEq/L   Chloride 100  96 - 112 mEq/L   CO2 26  19 - 32 mEq/L   Glucose, Bld 131 (*) 70 - 99 mg/dL   BUN 19  6 - 23 mg/dL   Creatinine, Ser 0.95  0.50 - 1.35 mg/dL   Calcium 8.3 (*) 8.4 - 10.5 mg/dL   GFR calc non Af Amer 89 (*) >90 mL/min   GFR calc Af Amer >90  >90 mL/min  MAGNESIUM     Status: Normal   Collection Time   03/03/12  6:10 AM      Component Value Range   Magnesium 1.8  1.5 - 2.5 mg/dL  GLUCOSE, CAPILLARY     Status: Abnormal   Collection Time   03/03/12  8:02 AM      Component Value Range   Glucose-Capillary 128 (*) 70 - 99 mg/dL   Dg Chest Port 1v Same Day 03/02/2012   IMPRESSION:  1.   Improved inspiration with resolved left basilar atelectasis and almost completely resolved right basilar atelectasis. 2.  Right  PICC tip in the inferior aspect of the superior vena cava.  If a position at the cavoatrial junction is desired, it would be recommended that this be advanced 2 cm.   Original Report Authenticated By: Gerald Stabs, M.D.     Assessment/Plan: Diagnosis: deconditioning after resection of colonic mass, sepsis and multiple medical surgical complications 1. Does the need for close, 24 hr/day medical supervision in concert with the patient's rehab needs make it unreasonable for this patient to be served in a less intensive setting? Potentially 2. Co-Morbidities requiring supervision/potential complications: OSA, wound care 3. Due to bladder management, bowel management, safety, skin/wound care, disease management, medication administration, pain management and patient education, does the patient require 24 hr/day rehab nursing? Yes and Potentially 4. Does the patient require coordinated care of a physician, rehab nurse, PT (1-2 hrs/day, 5 days/week) and OT (1-2 hrs/day, 5 days/week), potentially SLP to address physical and functional deficits in the context of the above medical diagnosis(es)? Potentially Addressing deficits in the following areas: balance, endurance, locomotion, strength, transferring, bowel/bladder control, bathing, dressing, feeding, grooming, toileting, cognition and psychosocial support 5. Can the patient actively participate in an intensive therapy program of at least 3 hrs of therapy per day at least 5 days per week? Potentially 6. The potential for patient to make measurable gains while on inpatient rehab is good and fair 7. Anticipated functional outcomes upon discharge from inpatient rehab are minimal assist with PT, min to mod assist with OT, supervision with SLP. 8. Estimated rehab length of stay to reach the above functional goals is: 2-3 weeks 9. Does  the patient have adequate social supports to accommodate these discharge functional goals? No and Potentially 10. Anticipated D/C setting: Home 11. Anticipated post D/C treatments: [place] therapy 12. Overall Rehab/Functional Prognosis: good  RECOMMENDATIONS: This patient's condition is appropriate for continued rehabilitative care in the following setting: SNF vs ?CIR Patient has agreed to participate in recommended program. Potentially Note that insurance prior authorization may be required for reimbursement for recommended care.  Comment: Rehab RN will follow up regarding social supports, bed availability, etc.    10/1/2013Zach Naaman Plummer, MD

## 2012-03-03 NOTE — Progress Notes (Signed)
Physical Therapy Treatment Patient Details Name: [name] MRN: [id number] DOB: 28-Mar-1951 Today's Date: 03/03/2012 Time: [id number] PT Time Calculation (min): 25 min  PT Assessment / Plan / Recommendation Comments on Treatment Session  Pt progressing slowly with PT goals & mobility.  Cont's to require +2 total assist at this date, but was able to initiate sit<>stand.      Follow Up Recommendations  Post acute inpatient rehab    Barriers to Discharge        Equipment Recommendations  3 in 1 bedside comode    Recommendations for Other Services Rehab consult;OT consult  Frequency Min 3X/week   Plan Discharge plan remains appropriate;Frequency remains appropriate    Precautions / Restrictions Precautions Precautions: Fall Precaution Comments: Wound vac Restrictions Weight Bearing Restrictions: No   Pertinent Vitals/Pain C/o bil feet pain.  8/10.  RN made aware.      Mobility  Bed Mobility Bed Mobility: Sit to Supine;Sitting - Scoot to Edge of Bed;Rolling Right;Right Sidelying to Sit Rolling Right: 2: Max assist;With rail Right Sidelying to Sit: 1: +2 Total assist;With rails;HOB elevated Right Sidelying to Sit: Patient Percentage: 40% Sitting - Scoot to Edge of Bed: 2: Max assist Sit to Supine: 1: +2 Total assist;HOB flat Sit to Supine: Patient Percentage: 20% Details for Bed Mobility Assistance: max directional cues for sequencing & technique.  assist for LE's & to bring shoulders/trunk to sitting upright, & to square hips up on EOB & scoot to EOB with use of draw pad.    Cues for increased participation.  Pt required total assist for postural control with sitting EOB initially.     Transfers Transfers: Sit to Stand;Stand to Sit Sit to Stand: 1: +2 Total assist;With upper extremity assist;From bed Sit to Stand: Patient Percentage: 40% Stand to Sit: 1: +2 Total assist;With upper extremity assist;To bed Stand to Sit: Patient Percentage: 40% Details for Transfer  Assistance: Placed tall straight back chair in front of pt so pt could place UE's on back of chair.  use of draw pad to bring hips up off bed & to facilitate anterior weight shifting & upright posture.  Upon standing, pt with loose stools & required assist to sit back on bed.  Ambulation/Gait Ambulation/Gait Assistance: Not tested (comment)      PT Goals Acute Rehab PT Goals Time For Goal Achievement: 03/15/12 Potential to Achieve Goals: Good Pt will Roll Supine to Left Side: with modified independence;with rail Pt will go Supine/Side to Sit: with supervision;with HOB 0 degrees PT Goal: Supine/Side to Sit - Progress: Not met Pt will go Sit to Supine/Side: with supervision;with HOB 0 degrees PT Goal: Sit to Supine/Side - Progress: Not met Pt will go Sit to Stand: with min assist;with upper extremity assist PT Goal: Sit to Stand - Progress: Progressing toward goal Pt will go Stand to Sit: with min assist;with upper extremity assist PT Goal: Stand to Sit - Progress: Progressing toward goal Pt will Transfer Bed to Chair/Chair to Bed: with min assist Pt will Ambulate: 51 - 150 feet;with min assist;with rolling walker  Visit Information  Last PT Received On: 03/03/12 Assistance Needed: +2    Subjective Data  Subjective: "My feet hurt" Patient Stated Goal: Decrease pain   Cognition  Overall Cognitive Status: Impaired Area of Impairment: Attention;Safety/judgement;Following commands;Problem solving Arousal/Alertness: Awake/alert Orientation Level: Appears intact for tasks assessed Behavior During Session: [facility] for tasks performed Following Commands: Follows one step commands inconsistently Safety/Judgement: Decreased awareness of safety precautions;Decreased safety judgement for tasks  assessed Problem Solving: Required step by step cues for majority of mobility.  Requires increased time & often requires repeating of cues.      Balance  Balance Balance Assessed: Yes Static Sitting  Balance Static Sitting - Balance Support: Feet supported;Right upper extremity supported;Left upper extremity supported;Bilateral upper extremity supported Static Sitting - Level of Assistance: 3: Mod assist;1: +1 Total assist Static Sitting - Comment/# of Minutes: sitting balance fluctuated between SBA with use of rail & up to total assist without UE support.  Assist for postural control.  Without UE support pt leaning posteriorly & to Lt.  Sat EOB x 10 minutes.    End of Session PT - End of Session Equipment Utilized During Treatment: Gait belt Activity Tolerance: Patient tolerated treatment well Patient left: in bed;with call bell/phone within reach Nurse Communication: Mobility status     Sarajane Marek, [place] [phone] 03/03/2012

## 2012-03-03 NOTE — Progress Notes (Signed)
Clinical Social Worker received referral indicating need for post acute rehab.  CSW reviewed chart and attempted to meet with pt; pt currently not oriented.  CSW phoned pt's dtr, Renee.  CSW introduced self, explained role, and provided support.  CSW explained need to review rehab options; dtr stated she wanted to discuss this with CSW but was unable to openly communicate as she was running errands.  Dtr stated she would return [place] phone call shortly.  CSW to continue to follow and assist as needed.    Dala Dock, MSW, [place], [phone]

## 2012-03-04 DIAGNOSIS — G4733 Obstructive sleep apnea (adult) (pediatric): Secondary | ICD-10-CM

## 2012-03-04 DIAGNOSIS — E785 Hyperlipidemia, unspecified: Secondary | ICD-10-CM

## 2012-03-04 LAB — GLUCOSE, CAPILLARY
Glucose-Capillary: 126 mg/dL — ABNORMAL HIGH (ref 70–99)
Glucose-Capillary: 122 mg/dL — ABNORMAL HIGH (ref 70–99)
Glucose-Capillary: 101 mg/dL — ABNORMAL HIGH (ref 70–99)
Glucose-Capillary: 157 mg/dL — ABNORMAL HIGH (ref 70–99)
Glucose-Capillary: 112 mg/dL — ABNORMAL HIGH (ref 70–99)
Glucose-Capillary: 109 mg/dL — ABNORMAL HIGH (ref 70–99)

## 2012-03-04 MED ORDER — INSULIN REGULAR HUMAN 100 UNIT/ML IJ SOLN
INTRAVENOUS | Status: AC
  Filled 2012-03-04: qty 2000

## 2012-03-04 MED ORDER — LOPERAMIDE HCL 2 MG PO CAPS
2.0000 mg | ORAL_CAPSULE | Freq: Four times a day (QID) | ORAL | Status: DC | PRN
  Administered 2012-03-04 (×2): 2 mg via ORAL
  Filled 2012-03-04 (×3): qty 1

## 2012-03-04 MED ORDER — CARVEDILOL 12.5 MG PO TABS
12.5000 mg | ORAL_TABLET | Freq: Two times a day (BID) | ORAL | Status: DC
  Administered 2012-03-04 – 2012-03-10 (×12): 12.5 mg via ORAL
  Filled 2012-03-04 (×16): qty 1

## 2012-03-04 NOTE — Progress Notes (Signed)
PARENTERAL NUTRITION CONSULT NOTE - Follow up  Pharmacy Consult:  TPN Indication: Prolonged ileus  No Known Allergies  Patient Measurements: Height: 5\' 11"  (180.3 cm) Weight: 221 lb 9 oz (100.5 kg) IBW/kg (Calculated) : 75.3  Ideal Body Weight: 80 Usual Weight: 117kg  Vital Signs: Temp: 99.1 F (37.3 C) (10/02 0400) Temp src: Oral (10/02 0400) BP: 158/85 mmHg (10/02 0400) Pulse Rate: 66  (10/02 0400) Intake/Output from previous day: 10/01 0701 - 10/02 0700 In: 2570 [P.O.:620; I.V.:510; [id number] Out: 5830 [Urine:4065; [id number]  Labs:  [facility] 03/02/12 0359  WBC 11.7*  HGB 9.5*  HCT 26.7*  PLT 419*  APTT --  INR --     [facility] 03/03/12 0610 03/02/12 0359  NA 133* 134*  K 4.3 4.1  CL 100 99  CO2 26 28  GLUCOSE 131* 126*  BUN 19 21  CREATININE 0.95 1.01  LABCREA -- --  CREAT24HRUR -- --  CALCIUM 8.3* 8.3*  MG 1.8 1.7  PHOS -- 3.0  PROT -- 7.3  ALBUMIN -- 1.6*  AST -- 49*  ALT -- 26  ALKPHOS -- 230*  BILITOT -- 1.3*  BILIDIR -- --  IBILI -- --  PREALBUMIN -- 9.1*  TRIG -- 130  CHOLHDL -- --  CHOL -- 114   Estimated Creatinine Clearance: 99.9 ml/min (by C-G formula based on Cr of 0.95).    Basename 03/04/12 0826 03/04/12 0438 03/04/12 0101  GLUCAP 101* 122* 126*   Insulin Requirements in the past 24 hours:   16 units SSI; 60 units in TPN, Novolog 70/30 10 units BID added 9/30 PM by [facility], CBGs after first dose of 70/30 = 144, 108, 128  Current Nutrition:  TPN + clear liquid diet - advanced to carb modified this AM  Assessment: 61 year old man underwent hemicolectomy on 9/5 2/2 stage II colon CA and was discharged on 9/12.  He was readmitted 9/14 and underwent surgery for anastomatic leak and ileostomy on 9/15.  TPN initiated for prolonged ileus and prolonged fasting.  Patient started on clear liquid diet Sunday and advanced to carb modified this morning. No po intake was recorded in I/O for 9/30. Poor appetite per RN.  Prealbumin up to 9.1  from 4 - good response to nutrition support vs clinically improving condition.     Asked to wean and d/c tna.  Nutritional Goals:  1900-2000 kCal, 120-142 gm protein  Plan:  - Decrease tna to 30 ml/hr x 2hrs then d/c - Continue SSI per MD (was on this prior to TNA initiation) - Pharmacy will signoff, please reconsult as needed  [name], PharmD, [place] Clinical Pharmacist Pager: [phone] Pharmacy: [phone] 03/04/2012 9:05 AM

## 2012-03-04 NOTE — Progress Notes (Signed)
Patient not at a level to be able to participate in intense therapy at inpt rehab and go home in a short length of stay of 2 to 3 weeks. He will need a prolonged SNF rehab for recovery. I have notified SW and RN CM. Please call me for any questions. [phone]

## 2012-03-04 NOTE — Clinical Social Work Psychosocial (Signed)
     Clinical Social Work Department BRIEF PSYCHOSOCIAL ASSESSMENT 03/04/2012  Patient:  [name],[name]     Account Number:  [id number]     Admit date:  02/16/2012  Clinical Social Worker:  Katrinka Blazing  Date/Time:  03/04/2012 09:37 AM  Referred by:  Physician  Date Referred:  03/03/2012 Referred for  SNF Placement   Other Referral:   Interview type:  Family Other interview type:    PSYCHOSOCIAL DATA Living Status:  ALONE Admitted from facility:   Level of care:   Primary support name:  [name]: [phone] Primary support relationship to patient:  CHILD, ADULT Degree of support available:   Adequate.    CURRENT CONCERNS Current Concerns  Post-Acute Placement   Other Concerns:    SOCIAL WORK ASSESSMENT / PLAN Clinical Social Worker phoned pt's dtr, [name].  CSW reviewed dc planning.  Dtr agreeable to SNF search as [name] back up, but is hopeful for CIR.  Dtr reports that pt's mother is "61" and would be better able to visit with pt if he remained here at [facility] as she is familiar with the area. Dtr reports that she, pt's mother, and pt's other children (Twins age 61) are communicating and working together.  Per dtr, family agreeable with dtr being decision maker.  CSW to follow up with family.  CSW to fax pt out.  CSW to continue to follow and assist as needed.   Assessment/plan status:  Information/Referral to [employer] Other assessment/ plan:   Information/referral to community resources:   CIR/SNF.    PATIENTS/FAMILYS RESPONSE TO PLAN OF CARE: Pt currently unable to fully participate in assessment due to medical condition.  Dtr was pleasant and engaged in conversation.  Dtr thanked CSW for intervention.

## 2012-03-04 NOTE — Progress Notes (Signed)
Placed pt. On cpap as per order. Pt. Wearing full face mask as he does at home. Pt. cpap set on auto titrate. Pt. Is tolerating well at this time.

## 2012-03-04 NOTE — Progress Notes (Signed)
Physical Therapy Treatment Patient Details Name: [name] MRN: [id number] DOB: 1950-06-12 Today's Date: 03/04/2012 Time: [id number] PT Time Calculation (min): 31 min  PT Assessment / Plan / Recommendation Comments on Treatment Session  Pt able to transfer bed>3-in-1>recliner with use of sara plus today.  Pt with improved ability to follow cues today & increased balance with sitting EOB today.      Follow Up Recommendations  Post acute inpatient rehab    Barriers to Discharge        Equipment Recommendations  3 in 1 bedside comode    Recommendations for Other Services Rehab consult;OT consult  Frequency Min 3X/week   Plan Discharge plan remains appropriate;Frequency remains appropriate    Precautions / Restrictions Precautions Precautions: Fall Precaution Comments: Wound vac Restrictions Weight Bearing Restrictions: No       Mobility  Bed Mobility Bed Mobility: Rolling Right;Right Sidelying to Sit;Sitting - Scoot to Edge of Bed Rolling Right: 3: Mod assist;With rail Right Sidelying to Sit: 2: Max assist;With rails Sitting - Scoot to [employer] of Bed: 2: Max assist Details for Bed Mobility Assistance: cues for sequencing & technique.  Pt with improved ability to transition sidelying>sit with decreased assist & had increased postural control while scooting to EOB.  c Transfers Transfers: Sit to Stand;Stand to [employer] Transfer via Lift Equipment: Marketing executive Details for Transfer Assistance: Tamala Ser plus for transfers.  cues for technique & upright posture.  Pt able to tolerate bed>3-in-1>recliner.   Ambulation/Gait Ambulation/Gait Assistance: Not tested (comment)      PT Goals Acute Rehab PT Goals Time For Goal Achievement: 03/15/12 Potential to Achieve Goals: Good Pt will Roll Supine to Left Side: with modified independence;with rail Pt will go Supine/Side to Sit: with supervision;with HOB 0 degrees PT Goal: Supine/Side to Sit - Progress:  Progressing toward goal Pt will go Sit to Supine/Side: with supervision;with HOB 0 degrees Pt will go Sit to Stand: with min assist;with upper extremity assist PT Goal: Sit to Stand - Progress: Progressing toward goal Pt will go Stand to Sit: with min assist;with upper extremity assist PT Goal: Stand to Sit - Progress: Progressing toward goal Pt will Transfer Bed to Chair/Chair to Bed: with min assist PT Transfer Goal: Bed to Chair/Chair to Bed - Progress: Progressing toward goal Pt will Ambulate: 51 - 150 feet;with min assist;with rolling walker Additional Goals Additional Goal #1: Pt will verbalize when to wear abd binder  Visit Information  Last PT Received On: 03/04/12 Assistance Needed: +2    Subjective Data  Patient Stated Goal: Decrease pain   Cognition  Overall Cognitive Status: Impaired Area of Impairment: Following commands;Safety/judgement;Problem solving Arousal/Alertness: Awake/alert Orientation Level: Appears intact for tasks assessed Behavior During Session: [facility] for tasks performed Following Commands: Follows multi-step commands inconsistently Problem Solving: Required step by step cues for majority of mobility.  Requires increased time & often requires repeating of cues.      Balance  Static Sitting Balance Static Sitting - Balance Support: Feet supported;No upper extremity supported Static Sitting - Level of Assistance: 5: Stand by assistance Static Sitting - Comment/# of Minutes: Pt with much improved ability to sit EOB today.  SBA required.  Cues for anterior translation of trunk over BOS while sitting EOB due to pt leaning backwards but was able to correct without physical assist.    End of Session PT - End of Session Equipment Utilized During Treatment: Other (comment) (sara plus) Activity Tolerance: Patient tolerated treatment well Patient left: in  chair;with call bell/phone within reach Nurse Communication: Mobility status;Need for lift equipment      Sarajane Marek, [place] [phone] 03/04/2012

## 2012-03-04 NOTE — Consult Note (Signed)
[facility] Consult F/U NOTE [name] - Stepdown/ICU TEAM   [name] [id number] DOB: [birth year] DOA: 02/16/2012 PCP: [name], MD  Requesting physician: Dr. [name]  Date of consultation: 03/02/2012  Reason for consultation: Management of chronic medical conditions   Brief narrative: 61 year old male who recently underwent colonoscopy by [name] and was found to have 3-4 cm sessile mass in the distal transverse colon consistent with high -grade dysplasia with no signs of malignancy. He is status post laparoscopic surgery by Dr. [name], right hemicolectomy with primary anastomosis (02/06/2012), but developed postoperative renal failure in the setting of hypovolemia and an ileus.  He was ultimately discharged 02/13/12.  He required hospitalization 02/16/12 due to progressive abdominal pain, poor oral intake, nausea and vomiting, and was found to have large pneumoperitoneum due to an anastomotic leak.   Assessment/Plan:  Peritonitis  being managed by primary team - we are consulting for medical issues   [name]/2 blood cx + for gram + cocci in clusters (drawn 03/02/2012) likely to be staph epi/coag neg staph, which would be suggestive of contaminant - pt is currently afebrile and WBC have been on a downward trend in the absence of abx - will need to f/u speciation and determine if any further investigation warranted - no M on cardiac exam  Acute respiratory failure  has an intermittent mild stridor which is much worse when he is asleep - may be vocal cord edema due to the recent intubation (12 days) - PER RN, he tends to develop tachypnea and then an increase in his stridor but never becomes hypoxic and never complains of difficulty breathing  Anemia of chronic disease  Stable at his baseline    Type II diabetes mellitus, well controlled  A1C 6.8 - cont insulin - hold off on Metformin for now - CBGs currently well contrrolled  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 - pt underwent right hemi-colectomy on 02/06/12- returned on 9/15 with anastomotic leak - underwent resection of the anastomosis and ileostomy - care per primary team  ARF (acute renal failure)  resolved - creatinine is within normal limits - watch for return of renal impairment as ileostomy output is high   Protein-calorie malnutrition, severe  nutrition following - TNA being weaned by primary team  Hypomagnesemia  Replaced/normalized   OSA Resume usual nightly CPAP regimen   Obesity (BMI 30-34.9)  Hx of HTN Hypotension was initially a concern - BP has now improved/rebounded to HTN range - adjust medical tx and follow trend   Hx of Hyperlipidemia Was on lipitor as outpt - resume once oral intake more consistent/reliable  Code Status: Full Disposition Plan: SNF vs CIR placement per primary team  DVT prophylaxis: SCDs prescribed by primary service  HPI/Subjective: Pt is resting comfortably.  He denies f/c, sob, n/v, or signif abdom pain.     Objective: Blood pressure 164/80, pulse 68, temperature 98.4 F (36.9 C), temperature source Axillary, resp. rate 30, height 5\' 11"  ([name].803 m), weight 100.5 kg (221 lb 9 oz), SpO2 100.00%.  Intake/Output Summary (Last 24 hours) at 03/04/12 1144 Last data filed at 03/04/12 1000  Gross per 24 hour  Intake 2653.5 ml  Output   4480 ml  Net -1826.5 ml     Exam: General: No acute respiratory distress at rest - no stridor at this time  Lungs: Clear to auscultation bilaterally without wheezes or crackles Cardiovascular: Regular rate and rhythm without murmur gallop or rub  normal S1 and S2 Abdomen: Nontender, nondistended, soft, bowel sounds positive Extremities: No significant cyanosis, clubbing, or edema bilateral lower extremities  Data Reviewed: Basic Metabolic Panel:  Lab [id number] 03/02/12 0359 03/01/12 0450 02/29/12 0545 02/28/12 1821 02/28/12 1600 02/27/12 0426  NA 133* 134* 137 137  135 -- --  K 4.3 4.[name] 4.2 3.9 4.0 -- --  CL 100 99 102 101 99 -- --  CO2 26 28 28 28 28  -- --  GLUCOSE 131* 126* 233* 177* 128* -- --  BUN 19 21 23  24* 21 -- --  CREATININE 0.95 [name].01 [name].03 [name].15 0.98 -- --  CALCIUM 8.3* 8.3* 8.2* 8.2* 8.3* -- --  MG [name].8 [name].7 [name].8 [name].4* -- [name].6 --  PHOS -- 3.0 -- -- -- -- 3.0   Liver Function Tests:  Lab 03/02/12 0359 02/27/12 0426  AST 49* 35  ALT 26 21  ALKPHOS 230* 111  BILITOT [name].3* [name].3*  PROT 7.3 6.[name]  ALBUMIN [name].6* [name].3*   CBC:  Lab 03/02/12 0359 03/01/12 0450 02/29/12 0545 02/28/12 0400 02/27/12 0426  WBC 11.7* 10.9* 13.5* 11.9* 15.0*  NEUTROABS 7.4 -- -- -- --  HGB 9.5* 9.0* 9.5* 10.7* 10.4*  HCT 26.7* 26.2* 26.9* 30.8* 30.7*  MCV 74.6* 73.4* 73.[name]* 74.2* 75.[name]*  PLT 419* 395 431* 351 327   CBG:  Lab 03/04/12 0826 03/04/12 0438 03/04/12 0101 03/03/12 2014 03/03/12 1608  GLUCAP 101* 122* 126* 107* 122*    Recent Results (from the past 240 hour(s))  CULTURE, BLOOD (ROUTINE X 2)     Status: Normal (Preliminary result)   Collection Time   03/02/12  6:53 PM      Component Value Range Status Comment   Specimen Description BLOOD LEFT ARM   Final    Special Requests     Final    Value: BOTTLES DRAWN AEROBIC AND ANAEROBIC 10CC AER,9CC ANA   Culture  Setup Time 03/03/2012 02:08   Final    Culture     Final    Value:        BLOOD CULTURE RECEIVED NO GROWTH TO DATE CULTURE WILL BE HELD FOR 5 DAYS BEFORE ISSUING A FINAL NEGATIVE REPORT   Report Status PENDING   Incomplete   CULTURE, BLOOD (ROUTINE X 2)     Status: Normal (Preliminary result)   Collection Time   03/02/12  7:12 PM      Component Value Range Status Comment   Specimen Description BLOOD LEFT HAND   Final    Special Requests BOTTLES DRAWN AEROBIC AND ANAEROBIC 10CC   Final    Culture  Setup Time 03/03/2012 02:07   Final    Culture     Final    Value: GRAM POSITIVE COCCI IN CLUSTERS     [name] Note: Gram Stain Report Called to,Read Back By and Verified With: MARYBETH HANCOCK AT 11:59PM 10  2013 BY THOMI   Report Status PENDING   Incomplete      Studies:  Recent x-ray studies have been reviewed in detail by the Attending Physician  Scheduled Meds:  Reviewed in detail by the Attending Physician   Cherene Altes, MD [facility] Office  [phone] Pager [phone]  On-Call/Text Page:      [email or web address]      password [email or web address]  If 7PM-7AM, please contact night-coverage [email or web address] Password [email or web address] 03/04/2012, 11:44 AM   LOS: 17 days

## 2012-03-04 NOTE — Progress Notes (Signed)
7 Days Post-Op  Subjective: Appreciate Dr. Naaman Plummer' consultation and CSW assistance - SNF vs CIR placement Large amount of ileostomy output yesterday.  Still with good UOP  Objective: Vital signs in last 24 hours: Temp:  [98.1 F (36.7 C)-99.1 F (37.3 C)] 99.1 F (37.3 C) (10/02 0400) Pulse Rate:  [66-86] 66  (10/02 0400) Resp:  [23-32] 30  (10/02 0400) BP: (154-177)/(73-87) 158/85 mmHg (10/02 0400) SpO2:  [92 %-100 %] 92 % (10/02 0400) Weight:  [221 lb 9 oz (100.5 kg)] 221 lb 9 oz (100.5 kg) (10/02 0000) Last BM Date: 03/03/12  Intake/Output from previous day: 10/01 0701 - 10/02 0700 In: 2570 [P.O.:620; I.V.:510; TPN:1440] Out: 5830 [Urine:4065; [id number] Intake/Output this shift:    General appearance: no distress and slowed mentation Resp: Decreased breath sounds GI: soft, non-tender; bowel sounds normal; no masses,  no organomegaly Incision/Wound:  VAC good seal; to be changed today Ileostomy - pink; very watery output.  Lab Results:   [facility] 03/02/12 0359  WBC 11.7*  HGB 9.5*  HCT 26.7*  PLT 419*   BMET  Basename 03/03/12 0610 03/02/12 0359  NA 133* 134*  K 4.3 4.1  CL 100 99  CO2 26 28  GLUCOSE 131* 126*  BUN 19 21  CREATININE 0.95 1.01  CALCIUM 8.3* 8.3*   PT/INR No results found for this basename: LABPROT:2,INR:2 in the last 72 hours ABG No results found for this basename: PHART:2,PCO2:2,PO2:2,HCO3:2 in the last 72 hours  Studies/Results: Dg Chest Port 1v Same Day  03/02/2012  *RADIOLOGY REPORT*  Clinical Data: Cough and shortness of breath.  PORTABLE CHEST - 1 VIEW SAME DAY  Comparison: 02/29/2012.  Findings: Improved inspiration.  The cardiac silhouette remains borderline enlarged.  Improved aeration at the lung bases with minimal residual atelectasis at the medial right lung base.  No visible pleural fluid on this image.  Right PICC tip in the inferior aspect of the superior vena cava.  Thoracic spine degenerative changes.  IMPRESSION:  1.   Improved inspiration with resolved left basilar atelectasis and almost completely resolved right basilar atelectasis. 2.  Right PICC tip in the inferior aspect of the superior vena cava.  If a position at the cavoatrial junction is desired, it would be recommended that this be advanced 2 cm.   Original Report Authenticated By: Gerald Stabs, M.D.     Anti-infectives: Anti-infectives     Start     Dose/Rate Route Frequency Ordered Stop   02/26/12 0000   vancomycin (VANCOCIN) 1,250 mg in sodium chloride 0.9 % 250 mL IVPB  Status:  Discontinued        1,250 mg 166.7 mL/hr over 90 Minutes Intravenous Every 12 hours 02/25/12 1241 02/29/12 1118   02/22/12 0000   vancomycin (VANCOCIN) IVPB 1000 mg/200 mL premix  Status:  Discontinued        1,000 mg 200 mL/hr over 60 Minutes Intravenous Every 12 hours 02/21/12 0929 02/25/12 1241   02/21/12 1000   fluconazole (DIFLUCAN) IVPB 400 mg  Status:  Discontinued        400 mg 200 mL/hr over 60 Minutes Intravenous Every 24 hours 02/21/12 0929 03/01/12 1232   02/20/12 1000   vancomycin (VANCOCIN) 1,500 mg in sodium chloride 0.9 % 500 mL IVPB        1,500 mg 250 mL/hr over 120 Minutes Intravenous Every 24 hours 02/19/12 0942 02/21/12 1246   02/19/12 1000   piperacillin-tazobactam (ZOSYN) IVPB 3.375 g  Status:  Discontinued  3.375 g 12.5 mL/hr over 240 Minutes Intravenous 3 times per day 02/19/12 0942 03/01/12 1232   02/19/12 1000   vancomycin (VANCOCIN) 2,000 mg in sodium chloride 0.9 % 500 mL IVPB        2,000 mg 250 mL/hr over 120 Minutes Intravenous  Once 02/19/12 0942 02/19/12 1335   02/16/12 1615   ertapenem (INVANZ) 1 g in sodium chloride 0.9 % 50 mL IVPB        1 g 100 mL/hr over 30 Minutes Intravenous To Surgery 02/16/12 1606 02/16/12 1733   02/16/12 1600   ertapenem (INVANZ) 1 g in sodium chloride 0.9 % 50 mL IVPB  Status:  Discontinued        1 g 100 mL/hr over 30 Minutes Intravenous Every 24 hours 02/16/12 1508 02/19/12 0909    02/16/12 1415   cefTRIAXone (ROCEPHIN) 1 g in dextrose 5 % 50 mL IVPB        1 g 100 mL/hr over 30 Minutes Intravenous  Once 02/16/12 1407 02/16/12 1448          Assessment/Plan: s/p Procedure(s) (LRB) with comments: EXPLORATORY LAPAROTOMY (N/A) -   APPLICATION OF WOUND VAC (N/A) Wean TNA off VAC change PRN Imodium to slow ileostomy output - risk volume depletion/ renal failure Recheck labs in AM    LOS: 17 days    [name] K. 03/04/2012

## 2012-03-05 LAB — GLUCOSE, CAPILLARY
Glucose-Capillary: 125 mg/dL — ABNORMAL HIGH (ref 70–99)
Glucose-Capillary: 120 mg/dL — ABNORMAL HIGH (ref 70–99)
Glucose-Capillary: 150 mg/dL — ABNORMAL HIGH (ref 70–99)
Glucose-Capillary: 109 mg/dL — ABNORMAL HIGH (ref 70–99)
Glucose-Capillary: 167 mg/dL — ABNORMAL HIGH (ref 70–99)
Glucose-Capillary: 126 mg/dL — ABNORMAL HIGH (ref 70–99)

## 2012-03-05 LAB — CBC
HCT: 28.9 % — ABNORMAL LOW (ref 39.0–52.0)
Hemoglobin: 10 g/dL — ABNORMAL LOW (ref 13.0–17.0)
MCH: 25.4 pg — ABNORMAL LOW (ref 26.0–34.0)
MCHC: 34.6 g/dL (ref 30.0–36.0)
MCV: 73.4 fL — ABNORMAL LOW (ref 78.0–100.0)
Platelets: 329 10*3/uL (ref 150–400)
RBC: 3.94 MIL/uL — ABNORMAL LOW (ref 4.22–5.81)
RDW: 20.1 % — ABNORMAL HIGH (ref 11.5–15.5)
WBC: 9.2 10*3/uL (ref 4.0–10.5)

## 2012-03-05 LAB — CULTURE, BLOOD (ROUTINE X 2)

## 2012-03-05 MED ORDER — ATORVASTATIN CALCIUM 10 MG PO TABS
10.0000 mg | ORAL_TABLET | Freq: Every day | ORAL | Status: DC
Start: 2012-03-05 — End: 2012-03-10
  Administered 2012-03-05 – 2012-03-09 (×5): 10 mg via ORAL
  Filled 2012-03-05 (×6): qty 1

## 2012-03-05 NOTE — Consult Note (Addendum)
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  First post op visit with pt today.  He has a lot of questions and did not fully understand stoma creation.  I have spent a good deal of time today explaining stoma creation and what he will need to know regarding having an ileostomy.  He does have some questions regarding his dx., I have referred him to his surgeon for these questions.    Stomal assessment/size: 1 5/8" oval shaped stoma, flush with the skin, pink and moist  Peristomal assessment: pt has denudation of the peristomal skin from 3-9 o'clock that extends aprox. 3cm below the stoma, this is probably related to leakage that was reported by the bedside nursing staff. Not sure how much of an issue leakage was in the ICU, I was not contacted with any pouching problems so hx not really clear and pt unable to tell me much.       Treatment options for stomal/peristomal skin: Utilized 2" barrier ring around the stoma to protect the skin and create some gentle convexity.  If this does not work, may need convex pouch and belt. Will evaluate him if needed for this once he is more mobile as I will need to eval his abdominal contours when he sits up .  Output high output liquid green stool, however surgery notes this has improved. Using Imodium  Ostomy pouching: 1pc. Flat pouch cut to fit in oval shape and 2" barrier ring used on back of wafer for gentle convexity.  Education provided:  Educational booklet left in the room for his review.  I have demonstrated lock and roll closure and briefly reviewed dietary needs and frequency of emptying needed with ileostomy.  He will certainly require additional teaching for management, with goals of independence.    WOC will follow along with you for ostomy care and teaching.  OK for bedside nursing to change VAC, this is uncomplicated VAC dressing change.  [place], [place]

## 2012-03-05 NOTE — Progress Notes (Addendum)
Nurse tech called the desk and asked for help in the room, the patient had fallen at 2145. He was trying to get to the chair so that the nurse tech could change his sheets on his bed. According to the patient he became unsteady on his feet and fell against the wall and then onto his buttocks onto the floor. His vital signs are stable. BP 138/70, HR 62, RR 20, 100% oxygen saturation on room air and temp 98.7. He complains of no pain anywhere. He stated that he did not hit his head. No bruises or swelling noted. One laceration to the right elbow was present and bleeding small amounts. Gauze put to the elbow and taped in place. MD on call, Grandville Silos paged and called back at 2200. Vitals and situation reported to MD. No new orders given. Family member Renee called at 2210. Explained the fall to her. She stated she would be coming to visit her father tonight. Will continue to monitor closely.

## 2012-03-05 NOTE — Progress Notes (Signed)
Made round on patient, assisted patient with urinal. States he feels fine. Asked who was notified about his fall and I told him that his daughter, Joseph Art was notified and that the doctor was aware as well. Helped him get comfortable and he is now resting.

## 2012-03-05 NOTE — Progress Notes (Signed)
[facility]                                                                                Patient Demographics  [name], is a 61 y.o. male  [id number]  [id number]  DOB - 1950/06/24  Admit date - 02/16/2012  Admitting Physician Imogene Burn. [name], MD  Outpatient Primary MD for the patient is [name], MD  LOS - 18   Chief Complaint  Patient presents with  . Abdominal Pain        Assessment & Plan   Brief narrative:   61 year old male who recently underwent colonoscopy by [name] and was found to have 3-4 cm sessile mass in the distal transverse colon consistent with high -grade dysplasia with no signs of malignancy. He is status post laparoscopic surgery by Dr. [name], right hemicolectomy with primary anastomosis (02/06/2012), but developed postoperative renal failure in the setting of hypovolemia and an ileus. He was ultimately discharged 02/13/12.  He required hospitalization 02/16/12 due to progressive abdominal pain, poor oral intake, nausea and vomiting, and was found to have large pneumoperitoneum due to an anastomotic leak.    Assessment/Plan:   Peritonitis  being managed by primary team - we are consulting for medical issues     1/2 blood cx + for gram + cocci in clusters (drawn 03/02/2012)  Is coag-negative staph, which would be suggestive of contaminant - pt is currently afebrile and WBC have been on a downward trend in the absence of abx - no further workup at this time.    Acute respiratory failure and OSA  has an intermittent mild stridor which is much worse when he is asleep - may be vocal cord edema due to the recent intubation (12 days) - PER RN, he tends to develop tachypnea and then an increase in his stridor but never becomes hypoxic and never complains of difficulty breathing, he knew nightly CPAP for OSA, and monitor clinically with when necessary oxygen and nebulizer treatments and daytime if needed.       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    ARF (acute renal failure)  resolved - creatinine is within normal limits - watch for return of renal impairment as ileostomy output is high , will check BMP intermittently.   Hx of HTN  Hypotension was initially a concern - BP has now improved/rebounded to HTN range - currently stable on Coreg we'll continue to monitor and adjust medications as needed.    Hx of Hyperlipidemia  Was on lipitor as outpt - will add low-dose at this time is tolerating oral  diet.    Protein-calorie malnutrition, severe  nutrition following - TNA being weaned by primary team, on oral diet along with routine supplement.     Obesity (BMI 30-34.9)  Outpatient followup with PCP      Code Status: Full   Disposition Plan: SNF vs CIR placement per primary team   DVT prophylaxis:  SCDs prescribed by primary service     Time Spent in minutes   30   Antibiotics    Anti-infectives     Start     Dose/Rate Route Frequency Ordered Stop   02/26/12 0000   vancomycin (VANCOCIN) 1,250 mg in sodium chloride 0.9 % 250 mL IVPB  Status:  Discontinued        1,250 mg 166.7 mL/hr over 90 Minutes Intravenous Every 12 hours 02/25/12 1241 02/29/12 1118   02/22/12 0000   vancomycin (VANCOCIN) IVPB 1000 mg/200 mL premix  Status:  Discontinued        1,000 mg 200 mL/hr over 60 Minutes Intravenous Every 12 hours  02/21/12 0929 02/25/12 1241   02/21/12 1000   fluconazole (DIFLUCAN) IVPB 400 mg  Status:  Discontinued        400 mg 200 mL/hr over 60 Minutes Intravenous Every 24 hours 02/21/12 0929 03/01/12 1232   02/20/12 1000   vancomycin (VANCOCIN) 1,500 mg in sodium chloride 0.9 % 500 mL IVPB        1,500 mg 250 mL/hr over 120 Minutes Intravenous Every 24 hours 02/19/12 0942 02/21/12 1246   02/19/12 1000   piperacillin-tazobactam (ZOSYN) IVPB 3.375 g  Status:  Discontinued        3.375 g 12.5 mL/hr over 240 Minutes Intravenous 3 times per day 02/19/12 0942 03/01/12 1232   02/19/12 1000   vancomycin (VANCOCIN) 2,000 mg in sodium chloride 0.9 % 500 mL IVPB        2,000 mg 250 mL/hr over 120 Minutes Intravenous  Once 02/19/12 0942 02/19/12 1335   02/16/12 1615   ertapenem (INVANZ) 1 g in sodium chloride 0.9 % 50 mL IVPB        1 g 100 mL/hr over 30 Minutes Intravenous To Surgery 02/16/12 1606 02/16/12 1733   02/16/12 1600   ertapenem (INVANZ) 1 g in sodium chloride 0.9 % 50 mL IVPB  Status:  Discontinued        1 g 100 mL/hr over 30 Minutes Intravenous Every 24 hours 02/16/12 1508 02/19/12 0909   02/16/12 1415   cefTRIAXone (ROCEPHIN) 1 g in dextrose 5 % 50 mL IVPB        1 g 100 mL/hr over 30 Minutes Intravenous  Once 02/16/12 1407 02/16/12 1448          Scheduled Meds:   . carvedilol  12.5 mg Oral BID WC  . feeding supplement  237 mL Oral BID BM  . gabapentin  300 mg Oral BID  . insulin aspart  0-20 Units Subcutaneous Q4H  . insulin aspart protamine-insulin aspart  10 Units Subcutaneous BID WC  . multivitamin with minerals  1 tablet Oral Daily  . pantoprazole  40 mg Oral QHS  . sodium chloride  10-40 mL Intracatheter Q12H   Continuous Infusions:   . sodium chloride 20 mL/hr at 03/05/12 0536   PRN Meds:.fentaNYL, haloperidol lactate, loperamide, metoprolol, sodium chloride   DVT Prophylaxis  per primary team SCDs    Lab Results  Component Value Date   PLT 329 03/05/2012       Thurnell Lose M.D on  03/05/2012 at 3:45 PM  Between 7am to 7pm - Pager - [phone]  After 7pm go to [email or web address] - password [email or web address]  And look for the night coverage person covering for me after hours  [facility] Office  [phone]    Subjective:   [name] today has, No headache, No chest pain, No abdominal pain - No Nausea, No new weakness tingling or numbness, No Cough - SOB.    Objective:   Filed Vitals:   03/05/12 0430 03/05/12 0744 03/05/12 1008 03/05/12 1402  BP: 142/72 152/78 149/76 145/78  Pulse: 72 71 67 68  Temp: 99 F (37.2 C) 98.7 F (37.1 C) 98.1 F (36.7 C) 98.2 F (36.8 C)  TempSrc: Oral Oral Oral Oral  Resp: 18 24 20 18   Height:      Weight:      SpO2: 99% 100% 100% 100%    Wt Readings from Last 3 Encounters:  03/05/12 92.2 kg (203 lb 4.2 oz)  03/05/12 92.2 kg (203 lb 4.2 oz)  03/05/12 92.2 kg (203 lb 4.2 oz)     Intake/Output Summary (Last 24 hours) at 03/05/12 1545 Last data filed at 03/05/12 1400  Gross per 24 hour  Intake   1980 ml  Output   3275 ml  Net  -1295 ml    Exam Awake Alert, Oriented X 3, No new F.N deficits, Normal affect [place].AT,PERRAL Supple Neck,No JVD, No cervical lymphadenopathy appriciated.  Symmetrical Chest wall movement, Good air movement bilaterally, CTAB RRR,No Gallops,Rubs or new Murmurs, No Parasternal Heave +ve B.Sounds, Abd Soft, Non tender, No organomegaly appriciated, No rebound - guarding or rigidity. Ileostomy has stool, midline Abd wound Vac drain in place too. No Cyanosis, Clubbing or edema, No new Rash or bruise      Data Review   Micro Results Recent Results (from the past 240 hour(s))  CULTURE, BLOOD (ROUTINE X 2)     Status: Normal (Preliminary result)   Collection Time   03/02/12  6:53 PM      Component Value Range Status Comment   Specimen Description BLOOD LEFT ARM   Final    Special Requests     Final    Value: BOTTLES DRAWN AEROBIC AND ANAEROBIC 10CC AER,9CC  ANA   Culture  Setup Time 03/03/2012 02:08   Final    Culture     Final    Value:        BLOOD CULTURE RECEIVED NO GROWTH TO DATE CULTURE WILL BE HELD FOR 5 DAYS BEFORE ISSUING A FINAL NEGATIVE REPORT   Report Status PENDING   Incomplete   CULTURE, BLOOD (ROUTINE X 2)     Status: Normal   Collection Time   03/02/12  7:12 PM      Component Value Range Status Comment   Specimen Description BLOOD LEFT HAND   Final    Special Requests BOTTLES DRAWN AEROBIC AND ANAEROBIC 10CC   Final    Culture  Setup Time 03/03/2012 02:07   Final    Culture     Final    Value: STAPHYLOCOCCUS SPECIES (COAGULASE NEGATIVE)     Note: THE SIGNIFICANCE OF ISOLATING THIS ORGANISM FROM A SINGLE SET OF BLOOD CULTURES WHEN MULTIPLE SETS ARE DRAWN IS UNCERTAIN. PLEASE NOTIFY THE MICROBIOLOGY DEPARTMENT WITHIN ONE WEEK IF SPECIATION AND SENSITIVITIES ARE REQUIRED.     1 Note: Gram Stain Report Called to,Read Back By and Verified With: [facility] [place] AT 11:59PM 10 2013 BY [employer]   Report Status 03/05/2012 FINAL  Final     Radiology Reports Dg Chest 2 View  02/08/2012  *RADIOLOGY REPORT*  Clinical Data: Shortness of breath, congestion, fever and dyspnea. Status post colectomy.  CHEST - 2 VIEW  Comparison: Chest x-ray 01/30/2012.  Findings: Lung volumes are low.  The patient is in a very lordotic position.  With these limitations in mind, there appears to be an opacity in the medial aspect of the left lower lobe with some air bronchograms. Trace bilateral pleural effusions.  Mild crowding of the pulmonary vasculature, accentuated by patient's low lung volumes, without frank pulmonary edema.  Heart size appears borderline enlarged, likely accentuated by patient positioning. Mediastinal contours appear widened, also likely related to low lung volumes and lordotic positioning.  IMPRESSION: 1.  Low lung volumes with atelectasis and/or consolidation in the medial aspect of the left lower lobe. 2.  Trace bilateral pleural effusions.    Original Report Authenticated By: Etheleen Mayhew, M.D.    Dg Chest Port 1 View  02/29/2012  *RADIOLOGY REPORT*  Clinical Data: Atelectasis.  Pleural effusion.  PORTABLE CHEST - 1 VIEW  Comparison: Chest 02/28/2012.  Findings: Endotracheal tube and NG tube have been removed.  Right PICC remains in place.  Right greater right pleural effusions and airspace disease appear improved.  No pneumothorax.  Heart size upper normal.  IMPRESSION:  1.  Status post extubation and NG tube removal. 2.  Improved right greater than left pleural effusions and basilar airspace disease.   Original Report Authenticated By: Arvid Right. Luther Parody, M.D.    Dg Chest Port 1 View  02/28/2012  *RADIOLOGY REPORT*  Clinical Data: Pulmonary edema  PORTABLE CHEST - 1 VIEW  Comparison: 02/27/2012  Findings: Endotracheal tube, NG tube,   right hip are stable.  Hazy opacity at both lung bases right greater than left has improved. No pneumothorax.  IMPRESSION: Improved bilateral pleural effusions and bibasilar volume loss right greater than left.   Original Report Authenticated By: Jamas Lav, M.D.    Dg Chest Port 1 View  02/27/2012  *RADIOLOGY REPORT*  Clinical Data: Evaluate endotracheal tube, atelectasis  PORTABLE CHEST - 1 VIEW  Comparison: 02/26/2012; 02/25/2012; 02/24/2012  Findings: Grossly unchanged enlarged cardiac silhouette and mediastinal contours.  Stable position of support apparatus. Pulmonary vasculature remains indistinct.  Grossly unchanged layering bilateral pleural effusions and basilar heterogeneous opacities, right greater than left.  No definite pneumothorax. Unchanged bones.  IMPRESSION: 1.  Stable positioning of support apparatus.  No pneumothorax. 2.  Grossly unchanged findings of pulmonary edema, layering bilateral effusions and bibasilar opacities, right greater than left, possibly atelectasis   Original Report Authenticated By: Rachel Moulds, M.D.    Dg Chest Port 1 View  02/26/2012  *RADIOLOGY REPORT*   Clinical Data: Atelectasis.  PORTABLE CHEST - 1 VIEW  Comparison: 02/25/2012.  Findings: Endotracheal tube is in satisfactory position.  The nasogastric tube is followed into the stomach with the tip projecting beyond the inferior boundary the film.  Right PICC tip projects over the SVC.  Heart size stable.  Diffuse bilateral air space disease and bilateral pleural effusions persist. Left lower lobe collapse/consolidation.  IMPRESSION: Diffuse bilateral air space disease, bilateral pleural effusions and left lower lobe collapse/consolidation persist.   Original Report Authenticated By: Luretha Rued, M.D.    Dg Chest Port 1 View  02/25/2012  *RADIOLOGY REPORT*  Clinical Data: Endotracheal tube repositioning.  PORTABLE CHEST - 1 VIEW  Comparison: Earlier the same date and 02/24/2012.  Findings: 0907 hours.  The carina is not well defined.  The endotracheal tube has been slightly advanced, now approximately 3.4 cm above the expected location of the carina.  Nasogastric tube and right arm PICC are unchanged.  The heart size and mediastinal contours are stable.  There are stable basilar opacities and probable bilateral pleural effusions.  No pneumothorax is seen.  IMPRESSION: Slight advancement of the endotracheal tube within the mid trachea. No significant change in basilar airspace opacities and pleural effusions.   Original Report Authenticated By: Vivia Ewing, M.D.    Dg Chest Port 1 View  02/25/2012  *RADIOLOGY REPORT*  Clinical Data: Evaluate endotracheal tube, atelectasis  PORTABLE CHEST - 1 VIEW  Comparison: 02/24/2012; 02/23/2012; 02/22/2012  Findings: Grossly unchanged enlarged cardiac silhouette and mediastinal contours.  Stable position of support apparatus. Likely unchanged layering bilateral pleural effusions and bibasilar heterogeneous opacities, left greater than right.  No new focal airspace opacities.  Pulmonary vasculature remains indistinct.  No definite pneumothorax.  Unchanged  bones.  IMPRESSION: 1.  Stable positioning of support apparatus.  No pneumothorax. 2.  Grossly unchanged findings of mild edema small bilateral effusions and bibasilar opacities, left greater than right, possibly atelectasis.   Original Report Authenticated By: Rachel Moulds, M.D.    Dg Chest Port 1 View  02/24/2012  *RADIOLOGY REPORT*  Clinical Data: Atelectasis follow-up.  PORTABLE CHEST - 1 VIEW  Comparison: 02/23/2012  Findings: Endotracheal tube tip is similar in position above the carina.  NG tube descends below the image.  Heart size upper normal to mildly enlarged.  Central vascular congestion.  Mild perihilar opacity and layering pleural effusions with associated compressive atelectasis or infiltrate.  No pneumothorax.  No acute osseous finding.  IMPRESSION: Heart size upper normal with central vascular congestion. Mild edema suggested.  Right greater than left pleural effusions with associated consolidations, favor compressive atelectasis.   Original Report Authenticated By: Suanne Marker, M.D.    Dg Chest Port 1 View  02/23/2012  *RADIOLOGY REPORT*  Clinical Data: Follow-up evaluation for pleural effusions.  PORTABLE CHEST - 1 VIEW  Comparison: Chest x-ray 02/22/2012.  Findings: An endotracheal tube is in place with tip 4.1 cm above the carina. A nasogastric tube is seen extending into the stomach, however, the tip of the nasogastric tube extends below the lower margin of the image.  Lung volumes are low.  No consolidative airspace disease.  Slight hazy opacity projecting over the lower right hemithorax may represent a small posterior layering right- sided pleural effusion.  No left pleural effusion.  No pneumothorax.  Left lower lobe subsegmental atelectasis.  Pulmonary vasculature and cardiomediastinal silhouette are within normal limits.  IMPRESSION: 1.  Support apparatus, as above. 2.  Possible trace right sided pleural effusion layering posteriorly. 3.  Minimal left lower lobe  subsegmental atelectasis.   Original Report Authenticated By: Etheleen Mayhew, M.D.    Dg Chest Port 1 View  02/22/2012  *RADIOLOGY REPORT*  Clinical Data: Endotracheal tube positioning.  Ventilator dependent respiratory failure.  Recent partial colectomy.  PORTABLE CHEST - 1 VIEW  Comparison: 02/21/2012  Findings: Endotracheal tube tip is 3.8 cm above the carina. Nasogastric tube extends into the stomach.  The right internal jugular line appears to be in the vicinity of the SVC confluence.  There is a suggestion of left ventricular enlargement based on cardiac contour. Low lung volumes are present, causing crowding of the pulmonary vasculature.  Vague density at the right lung base does not silhouette the hemidiaphragm and  could represent layering effusion.  There is minimal airspace opacity medially at the left lung base as well.  IMPRESSION:  1.  Endotracheal tube is satisfactorily positioned, tip 3.8 cm above the carina. 2.  Right IJ line tip is at the confluence of the SVC. 3.  Mild retrocardiac airspace opacity in the left lower lobe, potentially atelectasis or pneumonia. 4.  Hazy opacity over the right lung base, query small layering pleural effusion.   Original Report Authenticated By: Carron Curie, M.D.    Dg Chest Port 1 View  02/21/2012  *RADIOLOGY REPORT*  Clinical Data: Ventilator dependent respiratory failure.  Recent partial colectomy.  Follow up basilar atelectasis.  PORTABLE CHEST - 1 VIEW  Comparison: Portable chest x-rays yesterday and dating back to 02/17/2012.  Findings: Endotracheal tube tip in satisfactory position projecting approximately 5 cm above the carina.  Right jugular central venous catheter tip projects over the upper SVC.  Nasogastric tube courses below the diaphragm into the stomach.  Suboptimal inspiration with worsening atelectasis in the lung bases.  Lungs otherwise clear. Cardiac silhouette enlarged.  Pulmonary vascularity normal.  IMPRESSION: Support apparatus  satisfactory.  Worsening bibasilar atelectasis. Stable cardiomegaly without pulmonary edema.   Original Report Authenticated By: Deniece Portela, M.D.    Dg Chest Port 1 View  02/20/2012  *RADIOLOGY REPORT*  Clinical Data: Status post intubation.  PORTABLE CHEST - 1 VIEW  Comparison: One view chest 02/20/2012 at 02:06 a.m.  Findings: Endotracheal tube terminates 2.2 cm above the carina and could be pulled back 1-2 cm for more optimal positioning.  The right IJ line and NG tube are stable.  Aeration is slightly improved.  Lung volumes remain low with mild bibasilar atelectasis.  IMPRESSION:  1.  The endotracheal tube terminates 2.2 cm above the carina and could be pulled back 1-2 cm for more optimal positioning. 2.  Support apparatus is otherwise stable. 3.  Improved aeration with persistent low lung volumes and right greater than left basilar atelectasis.   Original Report Authenticated By: Resa Miner. MATTERN, M.D.    Dg Chest Port 1 View  02/20/2012  *RADIOLOGY REPORT*  Clinical Data: Intubated patient.  PORTABLE CHEST - 1 VIEW  Comparison: Chest 02/19/2012.  Findings: Endotracheal tube is in place with tip in good position approximately 3 cm above the carina.  Right IJ catheter and NG tube again noted.  There is a new right pleural effusion and basilar airspace disease.  Left lung remains clear.  No pneumothorax. Cardiomegaly is noted.  IMPRESSION:  1.  ET tube in good position. 2.  New right pleural effusion with basilar opacity which could be due to atelectasis or pneumonia.   Original Report Authenticated By: Arvid Right. Luther Parody, M.D.    Dg Chest Port 1 View  02/19/2012  *RADIOLOGY REPORT*  Clinical Data: Open abdominal wound status post exploratory laparotomy.  PORTABLE CHEST - 1 VIEW  Comparison: 02/18/2012 and 02/17/2012.  Findings: 0634 hours.  The endotracheal tube, right IJ central venous catheter and nasogastric tube positions are unchanged. Overall lung aeration has improved with mild  residual bibasilar atelectasis.  There is no confluent airspace opacity, pneumothorax or significant pleural effusion.  The heart size and mediastinal contours are stable.  IMPRESSION: Interval improved pulmonary aeration.  Stable support system.   Original Report Authenticated By: Vivia Ewing, M.D.    Dg Chest Port 1 View  02/18/2012  *RADIOLOGY REPORT*  Clinical Data: Check endotracheal tube position  PORTABLE CHEST - 1 VIEW  Comparison:  02/17/2012  Findings: Cardiomediastinal silhouette is stable.  Stable NG tube position.  Endotracheal tube in place with tip 3.8 cm above the carina.  Stable right IJ central line position.  No acute infiltrate or pulmonary edema.  Poor inspiration with mild basilar atelectasis.  IMPRESSION: Stable support apparatus.  Mild basilar atelectasis.   Original Report Authenticated By: Lahoma Crocker, M.D.    Dg Chest Port 1 View  02/17/2012  *RADIOLOGY REPORT*  Clinical Data: Exploratory laparotomy.  Wound vac.  Perforated viscus.  PORTABLE CHEST - 1 VIEW  Comparison: 02/16/2012.  Findings: Endotracheal tube is unchanged.  The right IJ central line has been adjusted and redundant loop in the right paratracheal vascular structures is been reduced.  The tip of the right IJ central line is just superior to the confluence of the brachiocephalic veins.  Nasogastric tube is unchanged.  Low volumes with basilar atelectasis.  No airspace disease.  Cardiopericardial silhouette within normal limits for projection and inspiration.  IMPRESSION:  1.  Reduction of redundant loop from right IJ central line. 2.  Other support apparatus stable. 3.  Low volumes with basilar atelectasis.   Original Report Authenticated By: Dereck Ligas, M.D.    Dg Chest Port 1 View  02/16/2012  *RADIOLOGY REPORT*  Clinical Data: ET tube placement.  PORTABLE CHEST - 1 VIEW  Comparison: PA and lateral chest 02/08/2012.  Findings: NG tube is in place with the tip well positioned in the stomach.  Endotracheal tube  is also identified with the tip 2.1 cm above the carina.  Right IJ catheter is looped in the inferior vena cava with the tip projecting retrograde near the right subclavian vein.  No pneumothorax is identified.  Mild atelectasis in the right lung base is noted.  Left lung clear.  Cardiomegaly.  Lucency along the left heart border could be due to pneumopericardium.  IMPRESSION:  1.  ET tube in good position. 2.  Right IJ catheter is looped in the superior vena cava with the tip projecting retrograde in the right subclavian vein. 3.  Question pneumopericardium.   Original Report Authenticated By: Arvid Right. Luther Parody, M.D.    Dg Chest Port 1v Same Day  03/02/2012  *RADIOLOGY REPORT*  Clinical Data: Cough and shortness of breath.  PORTABLE CHEST - 1 VIEW SAME DAY  Comparison: 02/29/2012.  Findings: Improved inspiration.  The cardiac silhouette remains borderline enlarged.  Improved aeration at the lung bases with minimal residual atelectasis at the medial right lung base.  No visible pleural fluid on this image.  Right PICC tip in the inferior aspect of the superior vena cava.  Thoracic spine degenerative changes.  IMPRESSION:  1.  Improved inspiration with resolved left basilar atelectasis and almost completely resolved right basilar atelectasis. 2.  Right PICC tip in the inferior aspect of the superior vena cava.  If a position at the cavoatrial junction is desired, it would be recommended that this be advanced 2 cm.   Original Report Authenticated By: Gerald Stabs, M.D.    Dg Abd 2 Views  02/08/2012  *RADIOLOGY REPORT*  Clinical Data: Status post partial colectomy.  Evaluate for potential postoperative ileus.  ABDOMEN - 2 VIEW  Comparison: No priors.  Findings: There is a paucity of bowel gas.  There appear to be some dilated loops of bowel in the right upper quadrant of the abdomen measuring up to 5.1 cm in diameter.  These are not clearly colonic, and are suspicious for dilated loops of small bowel.  Some  air fluid levels are noted on the left lateral decubitus view.  No gross pneumoperitoneum. A small volume of gas is present within the stomach.  Multiple midline surgical staples and a couple of right- sided surgical staples are also noted.  IMPRESSION: 1.  Nonspecific bowel gas pattern, as above, concerning for potential small bowel obstruction. This may alternatively represent a regional small bowel ileus. 2.  No pneumoperitoneum.  These results were called by telephone on 02/08/2012 at 01:45 p.m. to Dr. Margot Chimes, who verbally acknowledged these results.   Original Report Authenticated By: Etheleen Mayhew, M.D.    Dg Abd Acute W/chest  02/16/2012  *RADIOLOGY REPORT*  Clinical Data: Abdominal pain and bloating.  Partial colectomy on 02/06/2012.  Hypertension.  Diabetes.  ACUTE ABDOMEN SERIES (ABDOMEN 2 VIEW & CHEST 1 VIEW)  Comparison: 02/11/2012  Findings: Large amount of pneumoperitoneum.  Given the volume, this is abnormal despite the patient having had surgery 10 days ago. Also, the prior pneumoperitoneum has resolved back on 02/11/2012, so this represents new pneumoperitoneum.  The appearance is compatible with a perforated viscus.  Abnormal dilated loops of small bowel noted with scattered air- fluid levels.  Small bowel loops measure up to 5 cm in diameter. Probable obstruction favored over ileus.  Skin clips noted.  Low lung volumes are present on the chest radiograph, with subsegmental atelectasis in the lung bases.  IMPRESSION:  1.  Prominent (and new from 02/11/2012) pneumoperitoneum.  At 10 days out from abdominal surgery, and this amount of pneumoperitoneum is indicative of a perforated viscus.  Emergent surgical consultation recommended. 2.  Bibasilar subsegmental atelectasis. 3.  Air fluid levels in dilated small bowel favoring small bowel obstruction over ileus.  Critical Value/emergent results were called by telephone to Dr. Verl Dicker (who verbally acknowledged these results) at the time of  interpretation on 02/16/2012 at 2:08 p.m.   Original Report Authenticated By: Carron Curie, M.D.    Dg Abd Acute W/chest  02/11/2012  *RADIOLOGY REPORT*  Clinical Data: Right-sided abdominal pain.  Partial colectomy 02/06/2012.  ACUTE ABDOMEN SERIES (ABDOMEN 2 VIEW & CHEST 1 VIEW)  Comparison: 02/08/2012  Findings: Lung volumes are low with curvilinear bilateral lower lobe atelectasis.  No free air beneath the diaphragms.  No pleural effusion.  The lungs are otherwise clear.  Heart size normal.  Mid abdominal clips are present.  Diffuse mild dilatation of multiple loops small bowel with multiple differential air-fluid levels.  Overall, the extent of gaseous bowel dilatation is increased.  Colon is not dilated but does contain gas.  IMPRESSION: Diffuse small bowel dilatation with gas filled colon which could indicate postoperative ileus, although a distal small bowel obstruction could have a similar appearance but would be less likely given the clinical context.  No free air.  Bilateral lower lobe atelectasis.   Original Report Authenticated By: Arline Asp, M.D.     CBC  Lab 03/05/12 0500 03/02/12 0359 03/01/12 0450 02/29/12 0545 02/28/12 0400  WBC 9.2 11.7* 10.9* 13.5* 11.9*  HGB 10.0* 9.5* 9.0* 9.5* 10.7*  HCT 28.9* 26.7* 26.2* 26.9* 30.8*  PLT 329 419* 395 431* 351  MCV 73.4* 74.6* 73.4* 73.1* 74.2*  MCH 25.4* 26.5 25.2* 25.8* 25.8*  MCHC 34.6 35.6 34.4 35.3 34.7  RDW 20.1* 20.7* 20.5* 20.2* 20.6*  LYMPHSABS -- 2.7 -- -- --  MONOABS -- 1.5* -- -- --  EOSABS -- 0.1 -- -- --  BASOSABS -- 0.0 -- -- --  BANDABS -- -- -- -- --  Chemistries   Lab 03/03/12 0610 03/02/12 0359 03/01/12 0450 02/29/12 0545 02/28/12 1821 02/28/12 1600  NA 133* 134* 137 137 135 --  K 4.3 4.1 4.2 3.9 4.0 --  CL 100 99 102 101 99 --  CO2 26 28 28 28 28  --  GLUCOSE 131* 126* 233* 177* 128* --  BUN 19 21 23  24* 21 --  CREATININE 0.95 1.01 1.03 1.15 0.98 --  CALCIUM 8.3* 8.3* 8.2* 8.2* 8.3* --  MG  1.8 1.7 1.8 1.4* -- 1.6  AST -- 49* -- -- -- --  ALT -- 26 -- -- -- --  ALKPHOS -- 230* -- -- -- --  BILITOT -- 1.3* -- -- -- --   ------------------------------------------------------------------------------------------------------------------ estimated creatinine clearance is 96 ml/min (by C-G formula based on Cr of 0.95). ------------------------------------------------------------------------------------------------------------------  [facility] 03/02/12 1638  HGBA1C 6.2*   ------------------------------------------------------------------------------------------------------------------ No results found for this basename: CHOL:2,HDL:2,LDLCALC:2,TRIG:2,CHOLHDL:2,LDLDIRECT:2 in the last 72 hours ------------------------------------------------------------------------------------------------------------------ No results found for this basename: TSH,T4TOTAL,FREET3,T3FREE,THYROIDAB in the last 72 hours ------------------------------------------------------------------------------------------------------------------ No results found for this basename: VITAMINB12:2,FOLATE:2,FERRITIN:2,TIBC:2,IRON:2,RETICCTPCT:2 in the last 72 hours  Coagulation profile No results found for this basename: INR:5,PROTIME:5 in the last 168 hours  No results found for this basename: DDIMER:2 in the last 72 hours  Cardiac Enzymes No results found for this basename: CK:3,CKMB:3,TROPONINI:3,MYOGLOBIN:3 in the last 168 hours ------------------------------------------------------------------------------------------------------------------ No components found with this basename: POCBNP:3

## 2012-03-05 NOTE — Progress Notes (Signed)
8 Days Post-Op  Subjective: Patient seems more alert No complaints Ostomy output is thicker VAC changed yesterday. Off TNA Tolerating PO's - more intake yesterday  Objective: Vital signs in last 24 hours: Temp:  [98.4 F (36.9 C)-99 F (37.2 C)] 98.7 F (37.1 C) (10/03 0744) Pulse Rate:  [60-80] 71  (10/03 0744) Resp:  [18-30] 24  (10/03 0744) BP: (142-167)/(70-117) 152/78 mmHg (10/03 0744) SpO2:  [99 %-100 %] 100 % (10/03 0744) Weight:  [203 lb 4.2 oz (92.2 kg)] 203 lb 4.2 oz (92.2 kg) (10/03 0000) Last BM Date: 03/04/12  Intake/Output from previous day: 10/02 0701 - 10/03 0700 In: 1778.5 [P.O.:1140; I.V.:460; TPN:178.5] Out: 3000 [Urine:1450; Drains:150; [id number] Intake/Output this shift:    General appearance: alert, cooperative and no distress GI: soft, non-tender; bowel sounds normal; no masses,  no organomegaly Ostomy - pink; good output - thicker consistency VAC with good seal  Lab Results:   Basename 03/05/12 0500  WBC 9.2  HGB 10.0*  HCT 28.9*  PLT 329   BMET  Basename 03/03/12 0610  NA 133*  K 4.3  CL 100  CO2 26  GLUCOSE 131*  BUN 19  CREATININE 0.95  CALCIUM 8.3*   PT/INR No results found for this basename: LABPROT:2,INR:2 in the last 72 hours ABG No results found for this basename: PHART:2,PCO2:2,PO2:2,HCO3:2 in the last 72 hours  Studies/Results: No results found.  Anti-infectives: Anti-infectives     Start     Dose/Rate Route Frequency Ordered Stop   02/26/12 0000   vancomycin (VANCOCIN) 1,250 mg in sodium chloride 0.9 % 250 mL IVPB  Status:  Discontinued        1,250 mg 166.7 mL/hr over 90 Minutes Intravenous Every 12 hours 02/25/12 1241 02/29/12 1118   02/22/12 0000   vancomycin (VANCOCIN) IVPB 1000 mg/200 mL premix  Status:  Discontinued        1,000 mg 200 mL/hr over 60 Minutes Intravenous Every 12 hours 02/21/12 0929 02/25/12 1241   02/21/12 1000   fluconazole (DIFLUCAN) IVPB 400 mg  Status:  Discontinued        400  mg 200 mL/hr over 60 Minutes Intravenous Every 24 hours 02/21/12 0929 03/01/12 1232   02/20/12 1000   vancomycin (VANCOCIN) 1,500 mg in sodium chloride 0.9 % 500 mL IVPB        1,500 mg 250 mL/hr over 120 Minutes Intravenous Every 24 hours 02/19/12 0942 02/21/12 1246   02/19/12 1000   piperacillin-tazobactam (ZOSYN) IVPB 3.375 g  Status:  Discontinued        3.375 g 12.5 mL/hr over 240 Minutes Intravenous 3 times per day 02/19/12 0942 03/01/12 1232   02/19/12 1000   vancomycin (VANCOCIN) 2,000 mg in sodium chloride 0.9 % 500 mL IVPB        2,000 mg 250 mL/hr over 120 Minutes Intravenous  Once 02/19/12 0942 02/19/12 1335   02/16/12 1615   ertapenem (INVANZ) 1 g in sodium chloride 0.9 % 50 mL IVPB        1 g 100 mL/hr over 30 Minutes Intravenous To Surgery 02/16/12 1606 02/16/12 1733   02/16/12 1600   ertapenem (INVANZ) 1 g in sodium chloride 0.9 % 50 mL IVPB  Status:  Discontinued        1 g 100 mL/hr over 30 Minutes Intravenous Every 24 hours 02/16/12 1508 02/19/12 0909   02/16/12 1415   cefTRIAXone (ROCEPHIN) 1 g in dextrose 5 % 50 mL IVPB  1 g 100 mL/hr over 30 Minutes Intravenous  Once 02/16/12 1407 02/16/12 1448          Assessment/Plan: s/p Procedure(s) (LRB) with comments: EXPLORATORY LAPAROTOMY (N/A) -   APPLICATION OF WOUND VAC (N/A) D/C foley Transfer to floor Continue PT Pursuing SNF placement. Monitor fluid status with ileostomy output - continue PRN imodium   LOS: 18 days    [name] K. 03/05/2012

## 2012-03-05 NOTE — Progress Notes (Signed)
Nutrition Follow-up  Intervention:   1.  Modify diet; recommend diet liberalization to Regular with chopped meats.  Post-prandial CBGs <180 mg/dL.  Assessment:   Pt intake has improved to ~50% of meals today.  Pt did well with soft fruits and soup.  Pt previously eating with assistance, but seemed to perform well with meal this afternoon. Pt did not receive Ensures today, have has been consuming 1 per day since regimen started.  Diet Order:  CHO Modified Medium, Ensure Complete BID, MVI  Meds: Scheduled Meds:   . atorvastatin  10 mg Oral q1800  . carvedilol  12.5 mg Oral BID WC  . feeding supplement  237 mL Oral BID BM  . gabapentin  300 mg Oral BID  . insulin aspart  0-20 Units Subcutaneous Q4H  . insulin aspart protamine-insulin aspart  10 Units Subcutaneous BID WC  . multivitamin with minerals  1 tablet Oral Daily  . pantoprazole  40 mg Oral QHS  . sodium chloride  10-40 mL Intracatheter Q12H   Continuous Infusions:   . sodium chloride 20 mL/hr at 03/05/12 0536   PRN Meds:.fentaNYL, haloperidol lactate, loperamide, metoprolol, sodium chloride  Labs:  CMP     Component Value Date/Time   NA 133* 03/03/2012 0610   K 4.3 03/03/2012 0610   CL 100 03/03/2012 0610   CO2 26 03/03/2012 0610   GLUCOSE 131* 03/03/2012 0610   BUN 19 03/03/2012 0610   CREATININE 0.95 03/03/2012 0610   CREATININE 1.12 07/04/2011 1516   CALCIUM 8.3* 03/03/2012 0610   PROT 7.3 03/02/2012 0359   ALBUMIN 1.6* 03/02/2012 0359   AST 49* 03/02/2012 0359   ALT 26 03/02/2012 0359   ALKPHOS 230* 03/02/2012 0359   BILITOT 1.3* 03/02/2012 0359   GFRNONAA 89* 03/03/2012 0610   GFRAA >90 03/03/2012 0610   CBG (last 3)   Basename 03/05/12 1555 03/05/12 1238 03/05/12 0758  GLUCAP 167* 109* 150*    Intake/Output Summary (Last 24 hours) at 03/05/12 1605 Last data filed at 03/05/12 1600  Gross per 24 hour  Intake   1940 ml  Output   2950 ml  Net  -1010 ml    Weight Status:  203 lbs, inconsistent with previous  wts Wt range: 221-243 lbs since admission  Re-estimated needs:  1900-2000 kcal, 120-142g protein  Nutrition Dx:  Inadequate oral intake, ongoing  Monitor:   1. Parenteral nutrition; continued tolerance and initiation on weaning.  No longer appropriate, TPN discontinued. 2. Food/Beverage; pt with tolerance of PO diet. Pt to consume >50% of meals and supplements.  Somewhat meal, pt improving, but intake remain insufficient to meet needs.  3. Wt/wt change; monitor trends.  Ongoing.   Brynda Greathouse, MS RD LDN Clinical Inpatient Dietitian Pager: [phone] Weekend/After hours pager: [phone]

## 2012-03-06 LAB — CBC
HCT: 29.2 % — ABNORMAL LOW (ref 39.0–52.0)
Hemoglobin: 10.2 g/dL — ABNORMAL LOW (ref 13.0–17.0)
MCH: 25.3 pg — ABNORMAL LOW (ref 26.0–34.0)
MCHC: 34.9 g/dL (ref 30.0–36.0)
MCV: 72.5 fL — ABNORMAL LOW (ref 78.0–100.0)
Platelets: 407 10*3/uL — ABNORMAL HIGH (ref 150–400)
RBC: 4.03 MIL/uL — ABNORMAL LOW (ref 4.22–5.81)
RDW: 19.5 % — ABNORMAL HIGH (ref 11.5–15.5)
WBC: 10.4 10*3/uL (ref 4.0–10.5)

## 2012-03-06 LAB — GLUCOSE, CAPILLARY
Glucose-Capillary: 124 mg/dL — ABNORMAL HIGH (ref 70–99)
Glucose-Capillary: 119 mg/dL — ABNORMAL HIGH (ref 70–99)
Glucose-Capillary: 157 mg/dL — ABNORMAL HIGH (ref 70–99)
Glucose-Capillary: 89 mg/dL (ref 70–99)
Glucose-Capillary: 183 mg/dL — ABNORMAL HIGH (ref 70–99)
Glucose-Capillary: 220 mg/dL — ABNORMAL HIGH (ref 70–99)
Glucose-Capillary: 188 mg/dL — ABNORMAL HIGH (ref 70–99)

## 2012-03-06 LAB — BASIC METABOLIC PANEL
BUN: 16 mg/dL (ref 6–23)
CO2: 23 mEq/L (ref 19–32)
Calcium: 8.2 mg/dL — ABNORMAL LOW (ref 8.4–10.5)
Chloride: 99 mEq/L (ref 96–112)
Creatinine, Ser: 1.08 mg/dL (ref 0.50–1.35)
GFR calc Af Amer: 84 mL/min — ABNORMAL LOW (ref >90)
GFR calc non Af Amer: 73 mL/min — ABNORMAL LOW (ref >90)
Glucose, Bld: 137 mg/dL — ABNORMAL HIGH (ref 70–99)
Potassium: 4.3 mEq/L (ref 3.5–5.1)
Sodium: 130 mEq/L — ABNORMAL LOW (ref 135–145)

## 2012-03-06 LAB — OSMOLALITY, URINE: Osmolality, Ur: 265 mOsm/kg — ABNORMAL LOW (ref 390–1090)

## 2012-03-06 LAB — OSMOLALITY: Osmolality: 278 mOsm/kg (ref 275–300)

## 2012-03-06 LAB — SODIUM, URINE, RANDOM: Sodium, Ur: 18 mEq/L

## 2012-03-06 MED ORDER — SODIUM CHLORIDE 0.9 % IV SOLN
INTRAVENOUS | Status: AC
  Administered 2012-03-06 (×2): 1000 mL via INTRAVENOUS

## 2012-03-06 NOTE — Progress Notes (Signed)
PT has refused CPAP machine for tonight. PT said he would let RT know if he changes his mind. RT will continue to monitor.

## 2012-03-06 NOTE — Clinical Social Work Placement (Addendum)
    Clinical Social Work Department CLINICAL SOCIAL WORK PLACEMENT NOTE 03/06/2012  Patient:  [name],[name]  Account Number:  [id number] Admit date:  02/16/2012  Clinical Social Worker:  Katrinka Blazing  Date/time:  03/04/2012 12:00 M  Clinical Social Work is seeking post-discharge placement for this patient at the following level of care:   [place]   (*CSW will update this form in Epic as items are completed)   03/04/2012  Patient/family provided with [facility] Department of Clinical Social Work's list of facilities offering this level of care within the geographic area requested by the patient (or if unable, by the patient's family).  03/04/2012  Patient/family informed of their freedom to choose among providers that offer the needed level of care, that participate in Medicare, Medicaid or managed care program needed by the patient, have an available bed and are willing to accept the patient.  03/04/2012  Patient/family informed of [facility]' ownership interest in [facility], as well as of the fact that they are under no obligation to receive care at this facility.  PASARR submitted to EDS on 03/04/2012 PASARR number received from EDS on 03/04/12  FL2 transmitted to all facilities in geographic area requested by pt/family on  03/04/2012 FL2 transmitted to all facilities within larger geographic area on   Patient informed that his/her managed care company has contracts with or will negotiate with  certain facilities, including the following:     Patient/family informed of bed offers received:  03/06/12 Patient chooses bed at [facility] Physician recommends and patient chooses bed at    Patient to be transferred to [place] on  03-10-12 Patient to be transferred to facility by [facility]  The following physician request were entered in Epic:   Additional Comments:

## 2012-03-06 NOTE — Progress Notes (Signed)
9 Days Post-Op  Subjective: No complaints Ileostomy bag leaking Tolerating po  Objective: Vital signs in last 24 hours: Temp:  [98.1 F (36.7 C)-99.4 F (37.4 C)] 98.6 F (37 C) (10/04 0507) Pulse Rate:  [62-74] 74  (10/04 0507) Resp:  [18-24] 18  (10/04 0507) BP: (136-158)/(70-87) 158/87 mmHg (10/04 0507) SpO2:  [92 %-100 %] 92 % (10/04 0507) Last BM Date: 03/05/12  Intake/Output from previous day: 10/03 0701 - 10/04 0700 In: 1480 [P.O.:1440; I.V.:40] Out: 4660 [Urine:2925; Drains:160; Stool:1575] Intake/Output this shift:    Abdomen soft, VAC in place, ostomy viable Non tender.  Lab Results:   Basename 03/06/12 0500 03/05/12 0500  WBC 10.4 9.2  HGB 10.2* 10.0*  HCT 29.2* 28.9*  PLT 407* 329   BMET No results found for this basename: NA:2,K:2,CL:2,CO2:2,GLUCOSE:2,BUN:2,CREATININE:2,CALCIUM:2 in the last 72 hours PT/INR No results found for this basename: LABPROT:2,INR:2 in the last 72 hours ABG No results found for this basename: PHART:2,PCO2:2,PO2:2,HCO3:2 in the last 72 hours  Studies/Results: No results found.  Anti-infectives: Anti-infectives     Start     Dose/Rate Route Frequency Ordered Stop   02/26/12 0000   vancomycin (VANCOCIN) 1,250 mg in sodium chloride 0.9 % 250 mL IVPB  Status:  Discontinued        1,250 mg 166.7 mL/hr over 90 Minutes Intravenous Every 12 hours 02/25/12 1241 02/29/12 1118   02/22/12 0000   vancomycin (VANCOCIN) IVPB 1000 mg/200 mL premix  Status:  Discontinued        1,000 mg 200 mL/hr over 60 Minutes Intravenous Every 12 hours 02/21/12 0929 02/25/12 1241   02/21/12 1000   fluconazole (DIFLUCAN) IVPB 400 mg  Status:  Discontinued        400 mg 200 mL/hr over 60 Minutes Intravenous Every 24 hours 02/21/12 0929 03/01/12 1232   02/20/12 1000   vancomycin (VANCOCIN) 1,500 mg in sodium chloride 0.9 % 500 mL IVPB        1,500 mg 250 mL/hr over 120 Minutes Intravenous Every 24 hours 02/19/12 0942 02/21/12 1246   02/19/12 1000    piperacillin-tazobactam (ZOSYN) IVPB 3.375 g  Status:  Discontinued        3.375 g 12.5 mL/hr over 240 Minutes Intravenous 3 times per day 02/19/12 0942 03/01/12 1232   02/19/12 1000   vancomycin (VANCOCIN) 2,000 mg in sodium chloride 0.9 % 500 mL IVPB        2,000 mg 250 mL/hr over 120 Minutes Intravenous  Once 02/19/12 0942 02/19/12 1335   02/16/12 1615   ertapenem (INVANZ) 1 g in sodium chloride 0.9 % 50 mL IVPB        1 g 100 mL/hr over 30 Minutes Intravenous To Surgery 02/16/12 1606 02/16/12 1733   02/16/12 1600   ertapenem (INVANZ) 1 g in sodium chloride 0.9 % 50 mL IVPB  Status:  Discontinued        1 g 100 mL/hr over 30 Minutes Intravenous Every 24 hours 02/16/12 1508 02/19/12 0909   02/16/12 1415   cefTRIAXone (ROCEPHIN) 1 g in dextrose 5 % 50 mL IVPB        1 g 100 mL/hr over 30 Minutes Intravenous  Once 02/16/12 1407 02/16/12 1448          Assessment/Plan: s/p Procedure(s) (LRB) with comments: EXPLORATORY LAPAROTOMY (N/A) -   APPLICATION OF WOUND VAC (N/A)  Continuing current care  LOS: 19 days    [name] A 03/06/2012

## 2012-03-06 NOTE — Progress Notes (Signed)
Clinical Social Work  CSW spoke with dtr Joseph Art) via phone. Dtr requesting note stating hospitalization dates that is being requested by DSS and landlord. CSW placed letter in envelope for dtr in patient's room.  CSW and dtr discussed plans. CSW explained that patient is not eligible for CIR and spoke about SNF placement. Only offer at this time is [place]. Dtr is not agreeable to expand search and reports she will go and tour facility this weekend. CSW will continue to follow to assist with needs. Dtr has CSW contact information if needed.  [place], [place] [phone]

## 2012-03-06 NOTE — Progress Notes (Signed)
Physical Therapy Treatment Patient Details Name: [name] MRN: [id number] DOB: 1950-09-05 Today's Date: 03/06/2012 Time: 0920-0951 PT Time Calculation (min): 31 min  PT Assessment / Plan / Recommendation Comments on Treatment Session  Patient progressing very well today and more alert. Initally when moving patient noted that colostomy bag was leaking badly. RN was notified and came into room to change bag. Reentered room and patient was able to ambulate but requested to return to bed vs. sitting up     Follow Up Recommendations  Post acute inpatient rehab    Barriers to Discharge        Equipment Recommendations  3 in 1 bedside comode    Recommendations for Other Services    Frequency Min 3X/week   Plan Discharge plan remains appropriate;Frequency remains appropriate    Precautions / Restrictions Precautions Precautions: Fall Precaution Comments: wound vac   Pertinent Vitals/Pain     Mobility  Bed Mobility Rolling Right: 5: Supervision;With rail Right Sidelying to Sit: 5: Supervision;With rails Sitting - Scoot to Edge of Bed: 5: Supervision Sit to Supine: 5: Supervision Details for Bed Mobility Assistance: Cues for positioning and safety. Patient able to complete x2 with increased time Transfers Sit to Stand: 4: Min assist;With upper extremity assist;From bed;From chair/3-in-1 Stand to Sit: To chair/3-in-1;To bed;4: Min assist Details for Transfer Assistance: patient stood x4 with assistance to steady himself and for balance.  Ambulation/Gait Ambulation/Gait Assistance: 1: +2 Total assist Ambulation/Gait: Patient Percentage: 70% Ambulation Distance (Feet): 30 Feet Assistive device: Rolling walker Ambulation/Gait Assistance Details: Cues for safe use of RW. A for balance as patient loses posteriorly. Cues to increase step length. Patient with increased knee/hip flexion Gait Pattern: Trunk flexed;Decreased trunk rotation;Step-to pattern    Exercises     PT  Diagnosis:    PT Problem List:   PT Treatment Interventions:     PT Goals Acute Rehab PT Goals PT Goal: Rolling Supine to Left Side - Progress: Progressing toward goal PT Goal: Supine/Side to Sit - Progress: Progressing toward goal PT Goal: Sit to Supine/Side - Progress: Progressing toward goal PT Goal: Sit to Stand - Progress: Progressing toward goal PT Goal: Stand to Sit - Progress: Progressing toward goal PT Transfer Goal: Bed to Chair/Chair to Bed - Progress: Progressing toward goal PT Goal: Ambulate - Progress: Progressing toward goal  Visit Information  Last PT Received On: 03/06/12 Assistance Needed: +2 (for safety and ambulation)    Subjective Data      Cognition  Overall Cognitive Status: Appears within functional limits for tasks assessed/performed Arousal/Alertness: Awake/alert Orientation Level: Appears intact for tasks assessed Behavior During Session: [facility] for tasks performed    Balance     End of Session PT - End of Session Activity Tolerance: Patient tolerated treatment well Patient left: in chair;with call bell/phone within reach Nurse Communication: Mobility status   GP     Jacqualyn Posey 03/06/2012, 2:32 PM 03/06/2012 Jacqualyn Posey PTA [phone] pager [phone] office

## 2012-03-06 NOTE — Progress Notes (Signed)
[facility]                                                                                Patient Demographics  [name], is a 61 y.o. male  [id number]  [id number]  DOB - Jun 13, 1950  Admit date - 02/16/2012  Admitting Physician Imogene Burn. [name], MD  Outpatient Primary MD for the patient is [name], MD  LOS - [phone]   Chief Complaint  Patient presents with  . Abdominal Pain        Assessment & Plan   Brief narrative:   61 year old male who recently underwent colonoscopy by [name] and was found to have 3-4 cm sessile mass in the distal transverse colon consistent with high -grade dysplasia with no signs of malignancy. He is status post laparoscopic surgery by Dr. [name], right hemicolectomy with primary anastomosis (02/06/2012), but developed postoperative renal failure in the setting of hypovolemia and an ileus. He was ultimately discharged 02/13/12. He required hospitalization 02/16/12 due to progressive abdominal pain, poor oral intake, nausea and vomiting, and was found to have large pneumoperitoneum due to an anastomotic leak. Hospitalist service is providing medicine consult for the primary surgical team.   Assessment/Plan:   Peritonitis  being managed by primary team - we are consulting for medical issues     1/2 blood cx + for gram + cocci in clusters (drawn 03/02/2012)  Is coag-negative staph, which would be suggestive of contaminant - pt is currently afebrile and WBC have been on a downward trend in the absence of abx - no further workup at this time.    Acute respiratory failure and OSA  has an intermittent mild stridor which is much worse when he is asleep - may be vocal cord edema due to the recent intubation (12 days) - PER RN, he tends to develop tachypnea and then an increase in his stridor but never becomes hypoxic and never complains of difficulty breathing, he knew nightly CPAP for OSA, and monitor clinically with  when necessary oxygen and nebulizer treatments and daytime if needed.      Mild hyponatremia  Patient has robust ileostomy output likely causing mild dehydration and hyponatremia, on exam patient is mildly hypovolemic versus euvolemic, will check urine sodium urine osmolality along with serum osmolality to rule out SIADH, for now gentle normal saline repeat BMP in the morning, if sodium drops further fluid restriction along with low-dose Lasix should be tried.     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    ARF (acute renal failure)  resolved - creatinine is within normal limits -  watch for return of renal impairment as ileostomy output is high , will check BMP intermittently.   Hx of HTN  Hypotension was initially a concern - BP has now improved/rebounded to HTN range - currently stable on Coreg we'll continue to monitor and adjust medications as needed.    Hx of Hyperlipidemia  Was on lipitor as outpt - will add low-dose at this time is tolerating oral diet.    Protein-calorie malnutrition, severe  nutrition following - TNA being weaned by primary team, on oral diet along with routine supplement.     Obesity (BMI 30-34.9)  Outpatient followup with PCP      Code Status: Full   Disposition Plan: SNF vs CIR placement per primary team   DVT prophylaxis:  SCDs prescribed by primary service     Time Spent in minutes    30   Antibiotics    Anti-infectives     Start     Dose/Rate Route Frequency Ordered Stop   02/26/12 0000   vancomycin (VANCOCIN) 1,250 mg in sodium chloride 0.9 % 250 mL IVPB  Status:  Discontinued        1,250 mg 166.7 mL/hr over 90 Minutes Intravenous Every 12 hours 02/25/12 1241 02/29/12 1118   02/22/12 0000   vancomycin (VANCOCIN) IVPB 1000 mg/200 mL premix  Status:  Discontinued        1,000 mg 200 mL/hr over 60 Minutes Intravenous Every 12 hours 02/21/12 0929 02/25/12 1241   02/21/12 1000   fluconazole (DIFLUCAN) IVPB 400 mg  Status:  Discontinued        400 mg 200 mL/hr over 60 Minutes Intravenous Every 24 hours 02/21/12 0929 03/01/12 1232   02/20/12 1000   vancomycin (VANCOCIN) 1,500 mg in sodium chloride 0.9 % 500 mL IVPB        1,500 mg 250 mL/hr over 120 Minutes Intravenous Every 24 hours 02/19/12 0942 02/21/12 1246   02/19/12 1000   piperacillin-tazobactam (ZOSYN) IVPB 3.375 g  Status:  Discontinued        3.375 g 12.5 mL/hr over 240 Minutes Intravenous 3 times per day 02/19/12 0942 03/01/12 1232   02/19/12 1000   vancomycin (VANCOCIN) 2,000 mg in sodium chloride 0.9 % 500 mL IVPB        2,000 mg 250 mL/hr over 120 Minutes Intravenous  Once 02/19/12 0942 02/19/12 1335   02/16/12 1615   ertapenem (INVANZ) 1 g in sodium chloride 0.9 % 50 mL IVPB        1 g 100 mL/hr over 30 Minutes Intravenous To Surgery 02/16/12 1606 02/16/12 1733   02/16/12 1600   ertapenem (INVANZ) 1 g in sodium chloride 0.9 % 50 mL IVPB  Status:  Discontinued        1 g 100 mL/hr over 30 Minutes Intravenous Every 24 hours 02/16/12 1508 02/19/12 0909   02/16/12 1415   cefTRIAXone (ROCEPHIN) 1 g in dextrose 5 % 50 mL IVPB        1 g 100 mL/hr over 30 Minutes Intravenous  Once 02/16/12 1407 02/16/12 1448          Scheduled Meds:    . atorvastatin  10 mg Oral q1800  . carvedilol  12.5 mg Oral BID WC  . feeding supplement  237 mL Oral BID BM  . gabapentin  300 mg Oral BID  . insulin  aspart  0-20 Units Subcutaneous Q4H  . insulin aspart protamine-insulin aspart  10 Units Subcutaneous BID WC  . multivitamin  with minerals  1 tablet Oral Daily  . pantoprazole  40 mg Oral QHS  . sodium chloride  10-40 mL Intracatheter Q12H   Continuous Infusions:    . sodium chloride 20 mL/hr at 03/05/12 0536  . sodium chloride 1,000 mL (03/06/12 1037)   PRN Meds:.fentaNYL, haloperidol lactate, loperamide, metoprolol, sodium chloride   DVT Prophylaxis  per primary team SCDs    Lab Results  Component Value Date   PLT 407* 03/06/2012      Lala Lund K M.D on 03/06/2012 at 12:51 PM  Between 7am to 7pm - Pager - [phone]  After 7pm go to [email or web address] - password [email or web address]  And look for the night coverage person covering for me after hours  [facility] Office  [phone]    Subjective:   Sandon Buzzell today has, No headache, No chest pain, No abdominal pain - No Nausea, No new weakness tingling or numbness, No Cough - SOB.    Objective:   Filed Vitals:   03/05/12 1842 03/05/12 2120 03/05/12 2145 03/06/12 0507  BP: 136/72 143/71 138/70 158/87  Pulse: 71 67 62 74  Temp: 99.4 F (37.4 C) 98.1 F (36.7 C) 98.7 F (37.1 C) 98.6 F (37 C)  TempSrc: Oral Oral Oral Oral  Resp: 20 18 20 18   Height:      Weight:      SpO2: 100% 100% 100% 92%    Wt Readings from Last 3 Encounters:  03/05/12 92.2 kg (203 lb 4.2 oz)  03/05/12 92.2 kg (203 lb 4.2 oz)  03/05/12 92.2 kg (203 lb 4.2 oz)     Intake/Output Summary (Last 24 hours) at 03/06/12 1251 Last data filed at 03/06/12 1100  Gross per 24 hour  Intake    600 ml  Output   4860 ml  Net  -4260 ml    Exam Awake Alert, Oriented X 3, No new F.N deficits, Normal affect Ferdinand.AT,PERRAL Supple Neck,No JVD, No cervical lymphadenopathy appriciated.  Symmetrical Chest wall movement, Good air movement bilaterally, CTAB RRR,No Gallops,Rubs or new Murmurs, No Parasternal Heave +ve B.Sounds, Abd Soft, Non  tender, No organomegaly appriciated, No rebound - guarding or rigidity. Ileostomy has stool, midline Abd wound Vac drain in place too. No Cyanosis, Clubbing or edema, No new Rash or bruise      Data Review   Micro Results Recent Results (from the past 240 hour(s))  CULTURE, BLOOD (ROUTINE X 2)     Status: Normal (Preliminary result)   Collection Time   03/02/12  6:53 PM      Component Value Range Status Comment   Specimen Description BLOOD LEFT ARM   Final    Special Requests     Final    Value: BOTTLES DRAWN AEROBIC AND ANAEROBIC 10CC AER,9CC ANA   Culture  Setup Time 03/03/2012 02:08   Final    Culture     Final    Value:        BLOOD CULTURE RECEIVED NO GROWTH TO DATE CULTURE WILL BE HELD FOR 5 DAYS BEFORE ISSUING A FINAL NEGATIVE REPORT   Report Status PENDING   Incomplete   CULTURE, BLOOD (ROUTINE X 2)     Status: Normal   Collection Time   03/02/12  7:12 PM      Component Value Range Status Comment   Specimen Description BLOOD LEFT HAND   Final    Special Requests BOTTLES DRAWN AEROBIC AND ANAEROBIC 10CC   Final    Culture  Setup Time 03/03/2012  02:07   Final    Culture     Final    Value: STAPHYLOCOCCUS SPECIES (COAGULASE NEGATIVE)     Note: THE SIGNIFICANCE OF ISOLATING THIS ORGANISM FROM A SINGLE SET OF BLOOD CULTURES WHEN MULTIPLE SETS ARE DRAWN IS UNCERTAIN. PLEASE NOTIFY THE MICROBIOLOGY DEPARTMENT WITHIN ONE WEEK IF SPECIATION AND SENSITIVITIES ARE REQUIRED.     1 Note: Gram Stain Report Called to,Read Back By and Verified With: [facility] [place] AT 11:59PM 10 2013 BY Latimer   Report Status 03/05/2012 FINAL   Final     Radiology Reports Dg Chest 2 View  02/08/2012  *RADIOLOGY REPORT*  Clinical Data: Shortness of breath, congestion, fever and dyspnea. Status post colectomy.  CHEST - 2 VIEW  Comparison: Chest x-ray 01/30/2012.  Findings: Lung volumes are low.  The patient is in a very lordotic position.  With these limitations in mind, there appears to be an opacity in the  medial aspect of the left lower lobe with some air bronchograms. Trace bilateral pleural effusions.  Mild crowding of the pulmonary vasculature, accentuated by patient's low lung volumes, without frank pulmonary edema.  Heart size appears borderline enlarged, likely accentuated by patient positioning. Mediastinal contours appear widened, also likely related to low lung volumes and lordotic positioning.  IMPRESSION: 1.  Low lung volumes with atelectasis and/or consolidation in the medial aspect of the left lower lobe. 2.  Trace bilateral pleural effusions.   Original Report Authenticated By: Etheleen Mayhew, M.D.    Dg Chest Port 1 View  02/29/2012  *RADIOLOGY REPORT*  Clinical Data: Atelectasis.  Pleural effusion.  PORTABLE CHEST - 1 VIEW  Comparison: Chest 02/28/2012.  Findings: Endotracheal tube and NG tube have been removed.  Right PICC remains in place.  Right greater right pleural effusions and airspace disease appear improved.  No pneumothorax.  Heart size upper normal.  IMPRESSION:  1.  Status post extubation and NG tube removal. 2.  Improved right greater than left pleural effusions and basilar airspace disease.   Original Report Authenticated By: Arvid Right. Luther Parody, M.D.    Dg Chest Port 1 View  02/28/2012  *RADIOLOGY REPORT*  Clinical Data: Pulmonary edema  PORTABLE CHEST - 1 VIEW  Comparison: 02/27/2012  Findings: Endotracheal tube, NG tube,   right hip are stable.  Hazy opacity at both lung bases right greater than left has improved. No pneumothorax.  IMPRESSION: Improved bilateral pleural effusions and bibasilar volume loss right greater than left.   Original Report Authenticated By: Jamas Lav, M.D.    Dg Chest Port 1 View  02/27/2012  *RADIOLOGY REPORT*  Clinical Data: Evaluate endotracheal tube, atelectasis  PORTABLE CHEST - 1 VIEW  Comparison: 02/26/2012; 02/25/2012; 02/24/2012  Findings: Grossly unchanged enlarged cardiac silhouette and mediastinal contours.  Stable position of  support apparatus. Pulmonary vasculature remains indistinct.  Grossly unchanged layering bilateral pleural effusions and basilar heterogeneous opacities, right greater than left.  No definite pneumothorax. Unchanged bones.  IMPRESSION: 1.  Stable positioning of support apparatus.  No pneumothorax. 2.  Grossly unchanged findings of pulmonary edema, layering bilateral effusions and bibasilar opacities, right greater than left, possibly atelectasis   Original Report Authenticated By: Rachel Moulds, M.D.    Dg Chest Port 1 View  02/26/2012  *RADIOLOGY REPORT*  Clinical Data: Atelectasis.  PORTABLE CHEST - 1 VIEW  Comparison: 02/25/2012.  Findings: Endotracheal tube is in satisfactory position.  The nasogastric tube is followed into the stomach with the tip projecting beyond the inferior boundary the  film.  Right PICC tip projects over the SVC.  Heart size stable.  Diffuse bilateral air space disease and bilateral pleural effusions persist. Left lower lobe collapse/consolidation.  IMPRESSION: Diffuse bilateral air space disease, bilateral pleural effusions and left lower lobe collapse/consolidation persist.   Original Report Authenticated By: Luretha Rued, M.D.    Dg Chest Port 1 View  02/25/2012  *RADIOLOGY REPORT*  Clinical Data: Endotracheal tube repositioning.  PORTABLE CHEST - 1 VIEW  Comparison: Earlier the same date and 02/24/2012.  Findings: 0907 hours.  The carina is not well defined.  The endotracheal tube has been slightly advanced, now approximately 3.4 cm above the expected location of the carina.  Nasogastric tube and right arm PICC are unchanged.  The heart size and mediastinal contours are stable.  There are stable basilar opacities and probable bilateral pleural effusions.  No pneumothorax is seen.  IMPRESSION: Slight advancement of the endotracheal tube within the mid trachea. No significant change in basilar airspace opacities and pleural effusions.   Original Report Authenticated By:  Vivia Ewing, M.D.    Dg Chest Port 1 View  02/25/2012  *RADIOLOGY REPORT*  Clinical Data: Evaluate endotracheal tube, atelectasis  PORTABLE CHEST - 1 VIEW  Comparison: 02/24/2012; 02/23/2012; 02/22/2012  Findings: Grossly unchanged enlarged cardiac silhouette and mediastinal contours.  Stable position of support apparatus. Likely unchanged layering bilateral pleural effusions and bibasilar heterogeneous opacities, left greater than right.  No new focal airspace opacities.  Pulmonary vasculature remains indistinct.  No definite pneumothorax.  Unchanged bones.  IMPRESSION: 1.  Stable positioning of support apparatus.  No pneumothorax. 2.  Grossly unchanged findings of mild edema small bilateral effusions and bibasilar opacities, left greater than right, possibly atelectasis.   Original Report Authenticated By: Rachel Moulds, M.D.    Dg Chest Port 1 View  02/24/2012  *RADIOLOGY REPORT*  Clinical Data: Atelectasis follow-up.  PORTABLE CHEST - 1 VIEW  Comparison: 02/23/2012  Findings: Endotracheal tube tip is similar in position above the carina.  NG tube descends below the image.  Heart size upper normal to mildly enlarged.  Central vascular congestion.  Mild perihilar opacity and layering pleural effusions with associated compressive atelectasis or infiltrate.  No pneumothorax.  No acute osseous finding.  IMPRESSION: Heart size upper normal with central vascular congestion. Mild edema suggested.  Right greater than left pleural effusions with associated consolidations, favor compressive atelectasis.   Original Report Authenticated By: Suanne Marker, M.D.    Dg Chest Port 1 View  02/23/2012  *RADIOLOGY REPORT*  Clinical Data: Follow-up evaluation for pleural effusions.  PORTABLE CHEST - 1 VIEW  Comparison: Chest x-ray 02/22/2012.  Findings: An endotracheal tube is in place with tip 4.1 cm above the carina. A nasogastric tube is seen extending into the stomach, however, the tip of the nasogastric  tube extends below the lower margin of the image.  Lung volumes are low.  No consolidative airspace disease.  Slight hazy opacity projecting over the lower right hemithorax may represent a small posterior layering right- sided pleural effusion.  No left pleural effusion.  No pneumothorax.  Left lower lobe subsegmental atelectasis.  Pulmonary vasculature and cardiomediastinal silhouette are within normal limits.  IMPRESSION: 1.  Support apparatus, as above. 2.  Possible trace right sided pleural effusion layering posteriorly. 3.  Minimal left lower lobe subsegmental atelectasis.   Original Report Authenticated By: Etheleen Mayhew, M.D.    Dg Chest Port 1 View  02/22/2012  *RADIOLOGY REPORT*  Clinical Data:  Endotracheal tube positioning.  Ventilator dependent respiratory failure.  Recent partial colectomy.  PORTABLE CHEST - 1 VIEW  Comparison: 02/21/2012  Findings: Endotracheal tube tip is 3.8 cm above the carina. Nasogastric tube extends into the stomach.  The right internal jugular line appears to be in the vicinity of the SVC confluence.  There is a suggestion of left ventricular enlargement based on cardiac contour. Low lung volumes are present, causing crowding of the pulmonary vasculature.  Vague density at the right lung base does not silhouette the hemidiaphragm and could represent layering effusion.  There is minimal airspace opacity medially at the left lung base as well.  IMPRESSION:  1.  Endotracheal tube is satisfactorily positioned, tip 3.8 cm above the carina. 2.  Right IJ line tip is at the confluence of the SVC. 3.  Mild retrocardiac airspace opacity in the left lower lobe, potentially atelectasis or pneumonia. 4.  Hazy opacity over the right lung base, query small layering pleural effusion.   Original Report Authenticated By: Carron Curie, M.D.    Dg Chest Port 1 View  02/21/2012  *RADIOLOGY REPORT*  Clinical Data: Ventilator dependent respiratory failure.  Recent partial colectomy.   Follow up basilar atelectasis.  PORTABLE CHEST - 1 VIEW  Comparison: Portable chest x-rays yesterday and dating back to 02/17/2012.  Findings: Endotracheal tube tip in satisfactory position projecting approximately 5 cm above the carina.  Right jugular central venous catheter tip projects over the upper SVC.  Nasogastric tube courses below the diaphragm into the stomach.  Suboptimal inspiration with worsening atelectasis in the lung bases.  Lungs otherwise clear. Cardiac silhouette enlarged.  Pulmonary vascularity normal.  IMPRESSION: Support apparatus satisfactory.  Worsening bibasilar atelectasis. Stable cardiomegaly without pulmonary edema.   Original Report Authenticated By: Deniece Portela, M.D.    Dg Chest Port 1 View  02/20/2012  *RADIOLOGY REPORT*  Clinical Data: Status post intubation.  PORTABLE CHEST - 1 VIEW  Comparison: One view chest 02/20/2012 at 02:06 a.m.  Findings: Endotracheal tube terminates 2.2 cm above the carina and could be pulled back 1-2 cm for more optimal positioning.  The right IJ line and NG tube are stable.  Aeration is slightly improved.  Lung volumes remain low with mild bibasilar atelectasis.  IMPRESSION:  1.  The endotracheal tube terminates 2.2 cm above the carina and could be pulled back 1-2 cm for more optimal positioning. 2.  Support apparatus is otherwise stable. 3.  Improved aeration with persistent low lung volumes and right greater than left basilar atelectasis.   Original Report Authenticated By: Resa Miner. MATTERN, M.D.    Dg Chest Port 1 View  02/20/2012  *RADIOLOGY REPORT*  Clinical Data: Intubated patient.  PORTABLE CHEST - 1 VIEW  Comparison: Chest 02/19/2012.  Findings: Endotracheal tube is in place with tip in good position approximately 3 cm above the carina.  Right IJ catheter and NG tube again noted.  There is a new right pleural effusion and basilar airspace disease.  Left lung remains clear.  No pneumothorax. Cardiomegaly is noted.  IMPRESSION:  1.   ET tube in good position. 2.  New right pleural effusion with basilar opacity which could be due to atelectasis or pneumonia.   Original Report Authenticated By: Arvid Right. Luther Parody, M.D.    Dg Chest Port 1 View  02/19/2012  *RADIOLOGY REPORT*  Clinical Data: Open abdominal wound status post exploratory laparotomy.  PORTABLE CHEST - 1 VIEW  Comparison: 02/18/2012 and 02/17/2012.  Findings: 0634 hours.  The  endotracheal tube, right IJ central venous catheter and nasogastric tube positions are unchanged. Overall lung aeration has improved with mild residual bibasilar atelectasis.  There is no confluent airspace opacity, pneumothorax or significant pleural effusion.  The heart size and mediastinal contours are stable.  IMPRESSION: Interval improved pulmonary aeration.  Stable support system.   Original Report Authenticated By: Vivia Ewing, M.D.    Dg Chest Port 1 View  02/18/2012  *RADIOLOGY REPORT*  Clinical Data: Check endotracheal tube position  PORTABLE CHEST - 1 VIEW  Comparison: 02/17/2012  Findings: Cardiomediastinal silhouette is stable.  Stable NG tube position.  Endotracheal tube in place with tip 3.8 cm above the carina.  Stable right IJ central line position.  No acute infiltrate or pulmonary edema.  Poor inspiration with mild basilar atelectasis.  IMPRESSION: Stable support apparatus.  Mild basilar atelectasis.   Original Report Authenticated By: Lahoma Crocker, M.D.    Dg Chest Port 1 View  02/17/2012  *RADIOLOGY REPORT*  Clinical Data: Exploratory laparotomy.  Wound vac.  Perforated viscus.  PORTABLE CHEST - 1 VIEW  Comparison: 02/16/2012.  Findings: Endotracheal tube is unchanged.  The right IJ central line has been adjusted and redundant loop in the right paratracheal vascular structures is been reduced.  The tip of the right IJ central line is just superior to the confluence of the brachiocephalic veins.  Nasogastric tube is unchanged.  Low volumes with basilar atelectasis.  No airspace  disease.  Cardiopericardial silhouette within normal limits for projection and inspiration.  IMPRESSION:  1.  Reduction of redundant loop from right IJ central line. 2.  Other support apparatus stable. 3.  Low volumes with basilar atelectasis.   Original Report Authenticated By: Dereck Ligas, M.D.    Dg Chest Port 1 View  02/16/2012  *RADIOLOGY REPORT*  Clinical Data: ET tube placement.  PORTABLE CHEST - 1 VIEW  Comparison: PA and lateral chest 02/08/2012.  Findings: NG tube is in place with the tip well positioned in the stomach.  Endotracheal tube is also identified with the tip 2.1 cm above the carina.  Right IJ catheter is looped in the inferior vena cava with the tip projecting retrograde near the right subclavian vein.  No pneumothorax is identified.  Mild atelectasis in the right lung base is noted.  Left lung clear.  Cardiomegaly.  Lucency along the left heart border could be due to pneumopericardium.  IMPRESSION:  1.  ET tube in good position. 2.  Right IJ catheter is looped in the superior vena cava with the tip projecting retrograde in the right subclavian vein. 3.  Question pneumopericardium.   Original Report Authenticated By: Arvid Right. Luther Parody, M.D.    Dg Chest Port 1v Same Day  03/02/2012  *RADIOLOGY REPORT*  Clinical Data: Cough and shortness of breath.  PORTABLE CHEST - 1 VIEW SAME DAY  Comparison: 02/29/2012.  Findings: Improved inspiration.  The cardiac silhouette remains borderline enlarged.  Improved aeration at the lung bases with minimal residual atelectasis at the medial right lung base.  No visible pleural fluid on this image.  Right PICC tip in the inferior aspect of the superior vena cava.  Thoracic spine degenerative changes.  IMPRESSION:  1.  Improved inspiration with resolved left basilar atelectasis and almost completely resolved right basilar atelectasis. 2.  Right PICC tip in the inferior aspect of the superior vena cava.  If a position at the cavoatrial junction is  desired, it would be recommended that this be advanced 2 cm.  Original Report Authenticated By: Gerald Stabs, M.D.    Dg Abd 2 Views  02/08/2012  *RADIOLOGY REPORT*  Clinical Data: Status post partial colectomy.  Evaluate for potential postoperative ileus.  ABDOMEN - 2 VIEW  Comparison: No priors.  Findings: There is a paucity of bowel gas.  There appear to be some dilated loops of bowel in the right upper quadrant of the abdomen measuring up to 5.1 cm in diameter.  These are not clearly colonic, and are suspicious for dilated loops of small bowel.  Some air fluid levels are noted on the left lateral decubitus view.  No gross pneumoperitoneum. A small volume of gas is present within the stomach.  Multiple midline surgical staples and a couple of right- sided surgical staples are also noted.  IMPRESSION: 1.  Nonspecific bowel gas pattern, as above, concerning for potential small bowel obstruction. This may alternatively represent a regional small bowel ileus. 2.  No pneumoperitoneum.  These results were called by telephone on 02/08/2012 at 01:45 p.m. to Dr. Margot Chimes, who verbally acknowledged these results.   Original Report Authenticated By: Etheleen Mayhew, M.D.    Dg Abd Acute W/chest  02/16/2012  *RADIOLOGY REPORT*  Clinical Data: Abdominal pain and bloating.  Partial colectomy on 02/06/2012.  Hypertension.  Diabetes.  ACUTE ABDOMEN SERIES (ABDOMEN 2 VIEW & CHEST 1 VIEW)  Comparison: 02/11/2012  Findings: Large amount of pneumoperitoneum.  Given the volume, this is abnormal despite the patient having had surgery 10 days ago. Also, the prior pneumoperitoneum has resolved back on 02/11/2012, so this represents new pneumoperitoneum.  The appearance is compatible with a perforated viscus.  Abnormal dilated loops of small bowel noted with scattered air- fluid levels.  Small bowel loops measure up to 5 cm in diameter. Probable obstruction favored over ileus.  Skin clips noted.  Low lung volumes are present on  the chest radiograph, with subsegmental atelectasis in the lung bases.  IMPRESSION:  1.  Prominent (and new from 02/11/2012) pneumoperitoneum.  At 10 days out from abdominal surgery, and this amount of pneumoperitoneum is indicative of a perforated viscus.  Emergent surgical consultation recommended. 2.  Bibasilar subsegmental atelectasis. 3.  Air fluid levels in dilated small bowel favoring small bowel obstruction over ileus.  Critical Value/emergent results were called by telephone to Dr. Verl Dicker (who verbally acknowledged these results) at the time of interpretation on 02/16/2012 at 2:08 p.m.   Original Report Authenticated By: Carron Curie, M.D.    Dg Abd Acute W/chest  02/11/2012  *RADIOLOGY REPORT*  Clinical Data: Right-sided abdominal pain.  Partial colectomy 02/06/2012.  ACUTE ABDOMEN SERIES (ABDOMEN 2 VIEW & CHEST 1 VIEW)  Comparison: 02/08/2012  Findings: Lung volumes are low with curvilinear bilateral lower lobe atelectasis.  No free air beneath the diaphragms.  No pleural effusion.  The lungs are otherwise clear.  Heart size normal.  Mid abdominal clips are present.  Diffuse mild dilatation of multiple loops small bowel with multiple differential air-fluid levels.  Overall, the extent of gaseous bowel dilatation is increased.  Colon is not dilated but does contain gas.  IMPRESSION: Diffuse small bowel dilatation with gas filled colon which could indicate postoperative ileus, although a distal small bowel obstruction could have a similar appearance but would be less likely given the clinical context.  No free air.  Bilateral lower lobe atelectasis.   Original Report Authenticated By: Arline Asp, M.D.     CBC  Lab 03/06/12 0500 03/05/12 0500 03/02/12 0359 03/01/12 0450  02/29/12 0545  WBC 10.4 9.2 11.7* 10.9* 13.5*  HGB 10.2* 10.0* 9.5* 9.0* 9.5*  HCT 29.2* 28.9* 26.7* 26.2* 26.9*  PLT 407* 329 419* 395 431*  MCV 72.5* 73.4* 74.6* 73.4* 73.1*  MCH 25.3* 25.4* 26.5 25.2* 25.8*   MCHC 34.9 34.6 35.6 34.4 35.3  RDW 19.5* 20.1* 20.7* 20.5* 20.2*  LYMPHSABS -- -- 2.7 -- --  MONOABS -- -- 1.5* -- --  EOSABS -- -- 0.1 -- --  BASOSABS -- -- 0.0 -- --  BANDABS -- -- -- -- --    Chemistries   Lab 03/06/12 0500 03/03/12 0610 03/02/12 0359 03/01/12 0450 02/29/12 0545 02/28/12 1600  NA 130* 133* 134* 137 137 --  K 4.3 4.3 4.1 4.2 3.9 --  CL 99 100 99 102 101 --  CO2 23 26 28 28 28  --  GLUCOSE 137* 131* 126* 233* 177* --  BUN 16 19 21 23  24* --  CREATININE 1.08 0.95 1.01 1.03 1.15 --  CALCIUM 8.2* 8.3* 8.3* 8.2* 8.2* --  MG -- 1.8 1.7 1.8 1.4* 1.6  AST -- -- 49* -- -- --  ALT -- -- 26 -- -- --  ALKPHOS -- -- 230* -- -- --  BILITOT -- -- 1.3* -- -- --   ------------------------------------------------------------------------------------------------------------------ estimated creatinine clearance is 84.5 ml/min (by C-G formula based on Cr of 1.08). ------------------------------------------------------------------------------------------------------------------ No results found for this basename: HGBA1C:2 in the last 72 hours ------------------------------------------------------------------------------------------------------------------ No results found for this basename: CHOL:2,HDL:2,LDLCALC:2,TRIG:2,CHOLHDL:2,LDLDIRECT:2 in the last 72 hours ------------------------------------------------------------------------------------------------------------------ No results found for this basename: TSH,T4TOTAL,FREET3,T3FREE,THYROIDAB in the last 72 hours ------------------------------------------------------------------------------------------------------------------ No results found for this basename: VITAMINB12:2,FOLATE:2,FERRITIN:2,TIBC:2,IRON:2,RETICCTPCT:2 in the last 72 hours  Coagulation profile No results found for this basename: INR:5,PROTIME:5 in the last 168 hours  No results found for this basename: DDIMER:2 in the last 72 hours  Cardiac Enzymes No  results found for this basename: CK:3,CKMB:3,TROPONINI:3,MYOGLOBIN:3 in the last 168 hours ------------------------------------------------------------------------------------------------------------------ No components found with this basename: POCBNP:3

## 2012-03-07 LAB — BASIC METABOLIC PANEL
BUN: 12 mg/dL (ref 6–23)
CO2: 20 mEq/L (ref 19–32)
Calcium: 8 mg/dL — ABNORMAL LOW (ref 8.4–10.5)
Chloride: 104 mEq/L (ref 96–112)
Creatinine, Ser: 0.93 mg/dL (ref 0.50–1.35)
GFR calc Af Amer: >90 mL/min (ref >90)
GFR calc non Af Amer: 89 mL/min — ABNORMAL LOW (ref >90)
Glucose, Bld: 124 mg/dL — ABNORMAL HIGH (ref 70–99)
Potassium: 4.6 mEq/L (ref 3.5–5.1)
Sodium: 131 mEq/L — ABNORMAL LOW (ref 135–145)

## 2012-03-07 LAB — GLUCOSE, CAPILLARY
Glucose-Capillary: 118 mg/dL — ABNORMAL HIGH (ref 70–99)
Glucose-Capillary: 160 mg/dL — ABNORMAL HIGH (ref 70–99)
Glucose-Capillary: 175 mg/dL — ABNORMAL HIGH (ref 70–99)
Glucose-Capillary: 316 mg/dL — ABNORMAL HIGH (ref 70–99)
Glucose-Capillary: 87 mg/dL (ref 70–99)
Glucose-Capillary: 99 mg/dL (ref 70–99)

## 2012-03-07 MED ORDER — ALTEPLASE 2 MG IJ SOLR
2.0000 mg | Freq: Once | INTRAMUSCULAR | Status: AC
  Administered 2012-03-07: 2 mg
  Filled 2012-03-07 (×2): qty 2

## 2012-03-07 MED ORDER — ALTEPLASE 2 MG IJ SOLR
2.0000 mg | Freq: Once | INTRAMUSCULAR | Status: AC
  Administered 2012-03-07: 2 mg
  Filled 2012-03-07: qty 2

## 2012-03-07 MED ORDER — ALUM & MAG HYDROXIDE-SIMETH 200-200-20 MG/5ML PO SUSP
30.0000 mL | Freq: Four times a day (QID) | ORAL | Status: DC | PRN
  Administered 2012-03-09: 30 mL via ORAL
  Filled 2012-03-07 (×2): qty 30

## 2012-03-07 NOTE — Progress Notes (Signed)
Cont current care  Leighton Ruff. Redmond Pulling, MD, FACS General, Bariatric, & Minimally Invasive Surgery [facility], [place]

## 2012-03-07 NOTE — Progress Notes (Signed)
Patient ID: [name], male   DOB: 15-Oct-1950, 61 y.o.   MRN: [id number] 10 Days Post-Op  Subjective: Pt is confused.  Tolerating diet.  No bag issues overnight  Objective: Vital signs in last 24 hours: Temp:  [97.7 F (36.5 C)-98.7 F (37.1 C)] 97.7 F (36.5 C) (10/05 0611) Pulse Rate:  [64-65] 65  (10/05 0611) Resp:  [18] 18  (10/05 0611) BP: (140-162)/(67-75) 162/75 mmHg (10/05 0611) SpO2:  [100 %] 100 % (10/05 0611) Last BM Date: 03/06/12  Intake/Output from previous day: 10/04 0701 - 10/05 0700 In: 1216 [P.O.:540; I.V.:676] Out: 1580 [Urine:500; Drains:50; Stool:1030] Intake/Output this shift:    PE: Abd: soft, appropriately tender , Nd, +BS, ostomy with lima beans and stool present.  VAC in place  Lab Results:   Basename 03/06/12 0500 03/05/12 0500  WBC 10.4 9.2  HGB 10.2* 10.0*  HCT 29.2* 28.9*  PLT 407* 329   BMET  Basename 03/07/12 0600 03/06/12 0500  NA 131* 130*  K 4.6 4.3  CL 104 99  CO2 20 23  GLUCOSE 124* 137*  BUN 12 16  CREATININE 0.93 1.08  CALCIUM 8.0* 8.2*   PT/INR No results found for this basename: LABPROT:2,INR:2 in the last 72 hours CMP     Component Value Date/Time   NA 131* 03/07/2012 0600   K 4.6 03/07/2012 0600   CL 104 03/07/2012 0600   CO2 20 03/07/2012 0600   GLUCOSE 124* 03/07/2012 0600   BUN 12 03/07/2012 0600   CREATININE 0.93 03/07/2012 0600   CREATININE 1.12 07/04/2011 1516   CALCIUM 8.0* 03/07/2012 0600   PROT 7.3 03/02/2012 0359   ALBUMIN 1.6* 03/02/2012 0359   AST 49* 03/02/2012 0359   ALT 26 03/02/2012 0359   ALKPHOS 230* 03/02/2012 0359   BILITOT 1.3* 03/02/2012 0359   GFRNONAA 89* 03/07/2012 0600   GFRAA >90 03/07/2012 0600   Lipase  No results found for this basename: lipase       Studies/Results: No results found.  Anti-infectives: Anti-infectives     Start     Dose/Rate Route Frequency Ordered Stop   02/26/12 0000   vancomycin (VANCOCIN) 1,250 mg in sodium chloride 0.9 % 250 mL IVPB  Status:   Discontinued        1,250 mg 166.7 mL/hr over 90 Minutes Intravenous Every 12 hours 02/25/12 1241 02/29/12 1118   02/22/12 0000   vancomycin (VANCOCIN) IVPB 1000 mg/200 mL premix  Status:  Discontinued        1,000 mg 200 mL/hr over 60 Minutes Intravenous Every 12 hours 02/21/12 0929 02/25/12 1241   02/21/12 1000   fluconazole (DIFLUCAN) IVPB 400 mg  Status:  Discontinued        400 mg 200 mL/hr over 60 Minutes Intravenous Every 24 hours 02/21/12 0929 03/01/12 1232   02/20/12 1000   vancomycin (VANCOCIN) 1,500 mg in sodium chloride 0.9 % 500 mL IVPB        1,500 mg 250 mL/hr over 120 Minutes Intravenous Every 24 hours 02/19/12 0942 02/21/12 1246   02/19/12 1000   piperacillin-tazobactam (ZOSYN) IVPB 3.375 g  Status:  Discontinued        3.375 g 12.5 mL/hr over 240 Minutes Intravenous 3 times per day 02/19/12 0942 03/01/12 1232   02/19/12 1000   vancomycin (VANCOCIN) 2,000 mg in sodium chloride 0.9 % 500 mL IVPB        2,000 mg 250 mL/hr over 120 Minutes Intravenous  Once 02/19/12 0942 02/19/12 1335  02/16/12 1615   ertapenem (INVANZ) 1 g in sodium chloride 0.9 % 50 mL IVPB        1 g 100 mL/hr over 30 Minutes Intravenous To Surgery 02/16/12 1606 02/16/12 1733   02/16/12 1600   ertapenem (INVANZ) 1 g in sodium chloride 0.9 % 50 mL IVPB  Status:  Discontinued        1 g 100 mL/hr over 30 Minutes Intravenous Every 24 hours 02/16/12 1508 02/19/12 0909   02/16/12 1415   cefTRIAXone (ROCEPHIN) 1 g in dextrose 5 % 50 mL IVPB        1 g 100 mL/hr over 30 Minutes Intravenous  Once 02/16/12 1407 02/16/12 1448           Assessment/Plan  1.  S/p laparotomy with creation of ileostomy 2. Delirium 3. DM  Plan: 1. Cont regular diet 2. Ostomy pouch seems to be doing well today so far. 3. Awaiting SNF placement 4. Cont sitter for safety secondary to patient's confusion, etc 5. VAC change on Monday   LOS: 20 days    Tawan Degroote E 03/07/2012, 8:15 AM Pager: [phone]

## 2012-03-07 NOTE — Progress Notes (Signed)
[facility] PROGRESS NOTE  [name] [id number] DOB: 1950-07-06 DOA: 02/16/2012 PCP: [name], MD  Assessment/Plan 61 year old male who recently underwent colonoscopy by [name] and was found to have 3-4 cm sessile mass in the distal transverse colon consistent with high -grade dysplasia with no signs of malignancy. He is status post laparoscopic surgery by Dr. [name], right hemicolectomy with primary anastomosis (02/06/2012), but developed postoperative renal failure in the setting of hypovolemia and an ileus. He was ultimately discharged 02/13/12. He required hospitalization 02/16/12 due to progressive abdominal pain, poor oral intake, nausea and vomiting, and was found to have large pneumoperitoneum due to an anastomotic leak. Hospitalist service is providing medicine consult for the primary surgical team.   Assessment/Plan:  Peritonitis  being managed by primary team - we are consulting for medical issues   1/2 blood cx + for gram + cocci in clusters (drawn 03/02/2012)  Is coag-negative staph, which would be suggestive of contaminant - pt is currently afebrile and WBC have been on a downward trend in the absence of abx - no further workup at this time.   Acute respiratory failure and OSA  has an intermittent mild stridor which is much worse when he is asleep - may be vocal cord edema due to the recent intubation (12 days) - PER RN, he tends to develop tachypnea and then an increase in his stridor but never becomes hypoxic and never complains of difficulty breathing, monitor clinically with when necessary oxygen and nebulizer treatments and daytime if needed.  Mild hyponatremia  NA is 131 and he feels thirsty at times. Urine studies show OSM 265, NA 18 at this time. Serum OSM 278 Patient has robust ileostomy output likely causing mild dehydration and hyponatremia, on exam patient is mildly hypovolemic versus euvolemic,  for now gentle normal saline repeat BMP in the  morning, if sodium drops further fluid restriction along with low-dose Lasix should be tried.   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   ARF (acute renal failure)  resolved - creatinine is within normal limits - watch for return of renal impairment as ileostomy output is high , will check BMP intermittently.   Hx of HTN  Hypotension was initially a concern - BP has now improved/rebounded to HTN range - currently stable on Coreg we'll continue to monitor and adjust medications as needed.   Hx of Hyperlipidemia  Was on lipitor as outpt - will add low-dose at this time is tolerating oral diet.   Protein-calorie malnutrition, severe  nutrition following - TNA being weaned by primary team, on oral diet along with routine supplement.   Obesity (BMI 30-34.9)  Outpatient followup with PCP      Code Status: Full Family Communication: none present in the room Disposition Plan: SNF, [facility]  Consultants:  Medicine  Procedures:  As above  Antibiotics: Antibiotics Given (last 72 hours)    None      HPI/Subjective: Pt requesting if  he can drink more water. No other issues. He fell in last 24 hours but feeling well now.   Objective: Filed Vitals:   03/06/12 1402 03/06/12 2220 03/07/12 0611 03/07/12 1400  BP: 140/67 151/72 162/75 141/74  Pulse: 65 64 65 69  Temp: 98.7 F (37.1 C) 97.8 F (36.6 C) 97.7 F (36.5 C) 98.8 F (37.1 C)  TempSrc: Oral  Oral Oral Oral  Resp: 18 18 18 20   Height:      Weight:      SpO2: 100% 100% 100% 100%    Intake/Output Summary (Last 24 hours) at 03/07/12 1443 Last data filed at 03/07/12 1300  Gross per 24 hour  Intake   1366 ml  Output   1510 ml  Net   -144 ml   Filed Weights   02/29/12 0400 03/04/12 0000 03/05/12 0000  Weight: 100.3 kg (221 lb 1.9 oz) 100.5 kg (221 lb 9 oz) 92.2 kg (203 lb 4.2 oz)    Exam: Gen: lying in bed, NAD. Awake Alert, Oriented X 3 HEENT : NT, AC, OMM, no thrush Neck : Supple, No JVD, No cervical lymphadenopathy appriciated.  Chest : Symmetrical Chest wall movement, Good air movement bilaterally, CTAB  Heart : RRR,No Gallops,Rubs or new Murmurs, No Parasternal Heave  Abd : +ve B.Sounds, Abd Soft, Non tender.  Ileostomy noted. midline Abd wound Vac drain in place too.  Ext : No Cyanosis, Clubbing or edema  Data Reviewed: Basic Metabolic Panel:  Lab [id number] 03/06/12 0500 03/03/12 0610 03/02/12 0359 03/01/12 0450  NA 131* 130* 133* 134* 137  K 4.6 4.3 4.3 4.1 4.2  CL 104 99 100 99 102  CO2 20 23 26 28 28   GLUCOSE 124* 137* 131* 126* 233*  BUN 12 16 19 21 23   CREATININE 0.93 1.08 0.95 1.01 1.03  CALCIUM 8.0* 8.2* 8.3* 8.3* 8.2*  MG -- -- 1.8 1.7 1.8  PHOS -- -- -- 3.0 --   Liver Function Tests:  Lab 03/02/12 0359  AST 49*  ALT 26  ALKPHOS 230*  BILITOT 1.3*  PROT 7.3  ALBUMIN 1.6*   No results found for this basename: LIPASE:5,AMYLASE:5 in the last 168 hours No results found for this basename: AMMONIA:5 in the last 168 hours CBC:  Lab 03/06/12 0500 03/05/12 0500 03/02/12 0359 03/01/12 0450  WBC 10.4 9.2 11.7* 10.9*  NEUTROABS -- -- 7.4 --  HGB 10.2* 10.0* 9.5* 9.0*  HCT 29.2* 28.9* 26.7* 26.2*  MCV 72.5* 73.4* 74.6* 73.4*  PLT 407* 329 419* 395   Cardiac Enzymes: No results found for this basename: CKTOTAL:5,CKMB:5,CKMBINDEX:5,TROPONINI:5 in the last 168 hours BNP (last 3 results)  Basename 02/21/12 0839  PROBNP 295.9*    CBG:  Lab 03/07/12 1129 03/07/12 0745 03/07/12 0404 03/07/12 0008 03/06/12 1926  GLUCAP 99 118* 87 316* 188*    Recent Results (from the past 240 hour(s))  CULTURE, BLOOD (ROUTINE X 2)     Status: Normal (Preliminary result)   Collection Time   03/02/12  6:53 PM      Component Value Range Status Comment   Specimen Description BLOOD LEFT ARM   Final    Special Requests     Final    Value: BOTTLES DRAWN AEROBIC AND ANAEROBIC 10CC AER,9CC ANA   Culture  Setup Time 03/03/2012 02:08   Final    Culture     Final    Value:        BLOOD CULTURE RECEIVED NO GROWTH TO DATE CULTURE  WILL BE HELD FOR 5 DAYS BEFORE ISSUING A FINAL NEGATIVE REPORT   Report Status PENDING   Incomplete   CULTURE, BLOOD (ROUTINE X 2)     Status: Normal   Collection Time   03/02/12  7:12 PM      Component Value Range Status Comment   Specimen Description BLOOD LEFT HAND   Final    Special Requests BOTTLES DRAWN AEROBIC AND ANAEROBIC 10CC   Final    Culture  Setup Time 03/03/2012 02:07   Final    Culture     Final    Value: STAPHYLOCOCCUS SPECIES (COAGULASE NEGATIVE)     Note: THE SIGNIFICANCE OF ISOLATING THIS ORGANISM FROM A SINGLE SET OF BLOOD CULTURES WHEN MULTIPLE SETS ARE DRAWN IS UNCERTAIN. PLEASE NOTIFY THE MICROBIOLOGY DEPARTMENT WITHIN ONE WEEK IF SPECIATION AND SENSITIVITIES ARE REQUIRED.     1 Note: Gram Stain Report Called to,Read Back By and Verified With: [facility] [place] AT 11:59PM 10 2013 BY [place]   Report Status 03/05/2012 FINAL   Final      Studies: No results found.  Scheduled Meds:   . atorvastatin  10 mg Oral q1800  . carvedilol  12.5 mg Oral BID WC  . feeding supplement  237 mL Oral BID BM  . gabapentin  300 mg Oral BID  . insulin aspart  0-20 Units Subcutaneous Q4H  . insulin aspart protamine-insulin aspart  10 Units Subcutaneous BID WC  . multivitamin with minerals  1 tablet Oral Daily  . pantoprazole  40 mg Oral QHS  . sodium chloride  10-40 mL Intracatheter Q12H   Continuous  Infusions:   . sodium chloride 20 mL/hr at 03/07/12 0248  . sodium chloride 1,000 mL (03/06/12 1822)    Principal Problem:  *Peritonitis Active Problems:  Type II diabetes mellitus, well controlled  OBSTRUCTIVE SLEEP APNEA  Obesity (BMI 30.0-34.9)  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  ARF (acute renal failure)  Acute respiratory failure with hypoxia  Acidosis  Hypotension  Severe sepsis  Protein-calorie malnutrition, severe  Hypomagnesemia    Time spent: 30 min   Author [place] V.  [facility] Pager [phone] If 8PM-8AM, please contact night-coverage at [email or web address], password [facility] 03/07/2012, 2:43 PM  LOS: 20 days

## 2012-03-08 LAB — GLUCOSE, CAPILLARY
Glucose-Capillary: 101 mg/dL — ABNORMAL HIGH (ref 70–99)
Glucose-Capillary: 159 mg/dL — ABNORMAL HIGH (ref 70–99)
Glucose-Capillary: 169 mg/dL — ABNORMAL HIGH (ref 70–99)
Glucose-Capillary: 192 mg/dL — ABNORMAL HIGH (ref 70–99)
Glucose-Capillary: 97 mg/dL (ref 70–99)
Glucose-Capillary: 99 mg/dL (ref 70–99)
Glucose-Capillary: 99 mg/dL (ref 70–99)

## 2012-03-08 NOTE — Progress Notes (Signed)
[facility] PROGRESS NOTE  [name] [id number] DOB: 29-Jan-1951 DOA: 02/16/2012 PCP: [name], MD  Assessment/Plan: Peritonitis  being managed by primary team - we are consulting for medical issues   1/2 blood cx + for gram + cocci in clusters (drawn 03/02/2012) likely contaminant. Pt stable   Acute respiratory failure and OSA  has an intermittent mild stridor which is much worse when he is asleep - may be vocal cord edema due to the recent intubation (12 days) - PER RN, he tends to develop tachypnea and then an increase in his stridor but never becomes hypoxic and never complains of difficulty breathing, monitor clinically with when necessary oxygen and nebulizer treatments and daytime if needed.   Mild hyponatremia recheck CMP in am. He has high volume GI loss of electrolytes including sodium through his stoma. NA is 131 and he feels thirsty at times. Urine studies show OSM 265, NA 18 at this time. Serum OSM 278  He is on 1000 cc fluid restriction at this time. Consider NA replacement if needed.   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   ARF (acute renal failure)  resolved - creatinine is within normal limits - watch for return of renal impairment as ileostomy output is high , will check BMP intermittently.   Hx of HTN  Hypotension was initially a concern - BP has now improved/rebounded to HTN range - currently stable on Coreg we'll  continue to monitor and adjust medications as needed.   Hx of Hyperlipidemia  Was on lipitor as outpt - will add low-dose at this time is tolerating oral diet.   Protein-calorie malnutrition, severe  nutrition following - TNA being weaned by primary team, on oral diet along with routine supplement.   Obesity (BMI 30-34.9)  Outpatient followup with PCP   Code Status: Full Family Communication: talked to daughter and answered her questions.  Disposition Plan: as above   Consultants:  Medicine  Procedures:  As above  Antibiotics:  As above  HPI/Subjective: Pt tells me that hospital food is made in water and he wanted some greasy food so he is eating chicken. No other issues. He is asking if his stoma will be reversed soon or not. He feels thirsty sometimes. Daughter is asking if he can leave the room in wheelchair to get some fresh air.   Objective: Filed Vitals:   03/07/12 2249 03/08/12 0005 03/08/12 0649 03/08/12 1423  BP: 149/82  150/78 144/70  Pulse: 72 75 65 70  Temp: 98.4 F (36.9 C)  98.6 F (37 C) 99.8 F (37.7 C)  TempSrc:   Oral Oral  Resp: 18 18 18 20   Height:      Weight:      SpO2: 100% 100% 100% 100%    Intake/Output Summary (Last 24 hours) at 03/08/12 1735 Last data filed at 03/08/12 1530  Gross per 24 hour  Intake    560 ml  Output  2210 ml  Net  -1650 ml   Filed Weights   02/29/12 0400 03/04/12 0000 03/05/12 0000  Weight: 100.3 kg (221 lb 1.9 oz) 100.5 kg (221 lb 9 oz) 92.2 kg (203 lb 4.2 oz)    Exam: Gen: lying in bed, NAD. Awake Alert, Oriented X 3, eating church's chicken HEENT : NT, AC Neck : Supple, No JVD Chest : GBAE, CTAB  Heart : RRR,No Gallops,Rubs or new Murmurs Abd : +ve B.Sounds, Abd Soft, Non tender. Ileostomy noted. midline Abd wound Vac drain in place too.  Ext : No Cyanosis, Clubbing or edema  Data Reviewed: Basic Metabolic Panel:  Lab [id number] 03/06/12 0500 03/03/12 0610 03/02/12 0359  NA 131* 130* 133* 134*   K 4.6 4.3 4.3 4.1  CL 104 99 100 99  CO2 20 23 26 28   GLUCOSE 124* 137* 131* 126*  BUN 12 16 19 21   CREATININE 0.93 1.08 0.95 1.01  CALCIUM 8.0* 8.2* 8.3* 8.3*  MG -- -- 1.8 1.7  PHOS -- -- -- 3.0   Liver Function Tests:  Lab 03/02/12 0359  AST 49*  ALT 26  ALKPHOS 230*  BILITOT 1.3*  PROT 7.3  ALBUMIN 1.6*   No results found for this basename: LIPASE:5,AMYLASE:5 in the last 168 hours No results found for this basename: AMMONIA:5 in the last 168 hours CBC:  Lab 03/06/12 0500 03/05/12 0500 03/02/12 0359  WBC 10.4 9.2 11.7*  NEUTROABS -- -- 7.4  HGB 10.2* 10.0* 9.5*  HCT 29.2* 28.9* 26.7*  MCV 72.5* 73.4* 74.6*  PLT 407* 329 419*   Cardiac Enzymes: No results found for this basename: CKTOTAL:5,CKMB:5,CKMBINDEX:5,TROPONINI:5 in the last 168 hours BNP (last 3 results)  Basename 02/21/12 0839  PROBNP 295.9*   CBG:  Lab 03/08/12 1613 03/08/12 1204 03/08/12 0742 03/08/12 0411 03/08/12 0114  GLUCAP 159* 99 97 169* 99    Recent Results (from the past 240 hour(s))  CULTURE, BLOOD (ROUTINE X 2)     Status: Normal (Preliminary result)   Collection Time   03/02/12  6:53 PM      Component Value Range Status Comment   Specimen Description BLOOD LEFT ARM   Final    Special Requests     Final    Value: BOTTLES DRAWN AEROBIC AND ANAEROBIC 10CC AER,9CC ANA   Culture  Setup Time 03/03/2012 02:08   Final    Culture     Final    Value:        BLOOD CULTURE RECEIVED NO GROWTH TO DATE CULTURE WILL BE HELD FOR 5 DAYS BEFORE ISSUING A FINAL NEGATIVE REPORT   Report Status PENDING   Incomplete   CULTURE, BLOOD (ROUTINE X 2)     Status: Normal   Collection Time   03/02/12  7:12 PM      Component Value Range Status Comment   Specimen Description BLOOD LEFT HAND   Final    Special Requests BOTTLES DRAWN AEROBIC AND ANAEROBIC 10CC   Final    Culture  Setup Time 03/03/2012 02:07   Final    Culture     Final    Value: STAPHYLOCOCCUS SPECIES (COAGULASE NEGATIVE)     Note: THE  SIGNIFICANCE OF ISOLATING THIS ORGANISM FROM A SINGLE SET OF BLOOD CULTURES WHEN MULTIPLE SETS ARE DRAWN IS UNCERTAIN. PLEASE NOTIFY THE MICROBIOLOGY DEPARTMENT WITHIN ONE WEEK IF SPECIATION AND SENSITIVITIES ARE REQUIRED.     1 Note: Gram Stain Report Called to,Read Back By and Verified With: MARYBETH HANCOCK  AT 11:59PM 10 2013 BY THOMI   Report Status 03/05/2012 FINAL   Final      Studies: No results found.  Scheduled Meds:   . alteplase  2 mg Intracatheter Once  . alteplase  2 mg Intracatheter Once  . atorvastatin  10 mg Oral q1800  . carvedilol  12.5 mg Oral BID WC  . feeding supplement  237 mL Oral BID BM  . gabapentin  300 mg Oral BID  . insulin aspart  0-20 Units Subcutaneous Q4H  . insulin aspart protamine-insulin aspart  10 Units Subcutaneous BID WC  . multivitamin with minerals  1 tablet Oral Daily  . pantoprazole  40 mg Oral QHS  . sodium chloride  10-40 mL Intracatheter Q12H   Continuous Infusions:   . sodium chloride 20 mL/hr at 03/07/12 0248    Principal Problem:  *Peritonitis Active Problems:  Type II diabetes mellitus, well controlled  OBSTRUCTIVE SLEEP APNEA  Obesity (BMI 30.0-34.9)  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  ARF (acute renal failure)  Acute respiratory failure with hypoxia  Acidosis  Hypotension  Severe sepsis(995.92)  Protein-calorie malnutrition, severe  Hypomagnesemia    Time spent: 30 min    Rodney Moultrie V.  [facility] Pager [phone]. If 8PM-8AM, please contact night-coverage at [email or web address], password [facility] 03/08/2012, 5:35 PM  LOS: 21 days

## 2012-03-08 NOTE — Progress Notes (Signed)
Patient ID: [name], male   DOB: 10/29/1950, 61 y.o.   MRN: [id number] 11 Days Post-Op  Subjective: Pt sleeping, RN says that bag had to be changed 3 times overnight.  Being changed currently as I came in.  Objective: Vital signs in last 24 hours: Temp:  [98.4 F (36.9 C)-98.8 F (37.1 C)] 98.6 F (37 C) (10/06 0649) Pulse Rate:  [65-75] 65  (10/06 0649) Resp:  [18-20] 18  (10/06 0649) BP: (141-150)/(74-82) 150/78 mmHg (10/06 0649) SpO2:  [100 %] 100 % (10/06 0649) FiO2 (%):  [21 %] 21 % (10/06 0005) Last BM Date: 03/08/12  Intake/Output from previous day: 10/05 0701 - 10/06 0700 In: 630 [P.O.:630] Out: 2040 [Urine:1910; Stool:130] Intake/Output this shift:    PE: Abd: soft, stoma is pink and viable.  Wound VAC in place  Lab Results:   Basename 03/06/12 0500  WBC 10.4  HGB 10.2*  HCT 29.2*  PLT 407*   BMET  Basename 03/07/12 0600 03/06/12 0500  NA 131* 130*  K 4.6 4.3  CL 104 99  CO2 20 23  GLUCOSE 124* 137*  BUN 12 16  CREATININE 0.93 1.08  CALCIUM 8.0* 8.2*   PT/INR No results found for this basename: LABPROT:2,INR:2 in the last 72 hours CMP     Component Value Date/Time   NA 131* 03/07/2012 0600   K 4.6 03/07/2012 0600   CL 104 03/07/2012 0600   CO2 20 03/07/2012 0600   GLUCOSE 124* 03/07/2012 0600   BUN 12 03/07/2012 0600   CREATININE 0.93 03/07/2012 0600   CREATININE 1.12 07/04/2011 1516   CALCIUM 8.0* 03/07/2012 0600   PROT 7.3 03/02/2012 0359   ALBUMIN 1.6* 03/02/2012 0359   AST 49* 03/02/2012 0359   ALT 26 03/02/2012 0359   ALKPHOS 230* 03/02/2012 0359   BILITOT 1.3* 03/02/2012 0359   GFRNONAA 89* 03/07/2012 0600   GFRAA >90 03/07/2012 0600   Lipase  No results found for this basename: lipase       Studies/Results: No results found.  Anti-infectives: Anti-infectives     Start     Dose/Rate Route Frequency Ordered Stop   02/26/12 0000   vancomycin (VANCOCIN) 1,250 mg in sodium chloride 0.9 % 250 mL IVPB  Status:  Discontinued        1,250 mg 166.7 mL/hr over 90 Minutes Intravenous Every 12 hours 02/25/12 1241 02/29/12 1118   02/22/12 0000   vancomycin (VANCOCIN) IVPB 1000 mg/200 mL premix  Status:  Discontinued        1,000 mg 200 mL/hr over 60 Minutes Intravenous Every 12 hours 02/21/12 0929 02/25/12 1241   02/21/12 1000   fluconazole (DIFLUCAN) IVPB 400 mg  Status:  Discontinued        400 mg 200 mL/hr over 60 Minutes Intravenous Every 24 hours 02/21/12 0929 03/01/12 1232   02/20/12 1000   vancomycin (VANCOCIN) 1,500 mg in sodium chloride 0.9 % 500 mL IVPB        1,500 mg 250 mL/hr over 120 Minutes Intravenous Every 24 hours 02/19/12 0942 02/21/12 1246   02/19/12 1000   piperacillin-tazobactam (ZOSYN) IVPB 3.375 g  Status:  Discontinued        3.375 g 12.5 mL/hr over 240 Minutes Intravenous 3 times per day 02/19/12 0942 03/01/12 1232   02/19/12 1000   vancomycin (VANCOCIN) 2,000 mg in sodium chloride 0.9 % 500 mL IVPB        2,000 mg 250 mL/hr over 120 Minutes Intravenous  Once  02/19/12 0942 02/19/12 1335   02/16/12 1615   ertapenem (INVANZ) 1 g in sodium chloride 0.9 % 50 mL IVPB        1 g 100 mL/hr over 30 Minutes Intravenous To Surgery 02/16/12 1606 02/16/12 1733   02/16/12 1600   ertapenem (INVANZ) 1 g in sodium chloride 0.9 % 50 mL IVPB  Status:  Discontinued        1 g 100 mL/hr over 30 Minutes Intravenous Every 24 hours 02/16/12 1508 02/19/12 0909   02/16/12 1415   cefTRIAXone (ROCEPHIN) 1 g in dextrose 5 % 50 mL IVPB        1 g 100 mL/hr over 30 Minutes Intravenous  Once 02/16/12 1407 02/16/12 1448           Assessment/Plan  1. S/p ex lap with ileostomy 2. Confused 3. DM 4. ARF (resolved)  Plan: 1. Cont to change ostomy appliance as needed.  RN reports bag leaking due to large volume of liquid output.  Unfortunately, he does have some solid food pieces and so a different type pouch for the high volume is not feasible currently. 2. Appreciate medicine assistance 3. Looking for SNF  placement   LOS: 21 days    [name] E 03/08/2012, 8:19 AM Pager: [phone]

## 2012-03-08 NOTE — Progress Notes (Signed)
Wants more fluids.   ox3 abd soft, nt, nd. Ostomy viable  Await SNF placement  Leighton Ruff. Redmond Pulling, MD, FACS General, Bariatric, & Minimally Invasive Surgery [facility], [place]

## 2012-03-09 LAB — COMPREHENSIVE METABOLIC PANEL
ALT: 32 U/L (ref 0–53)
AST: 52 U/L — ABNORMAL HIGH (ref 0–37)
Albumin: 2 g/dL — ABNORMAL LOW (ref 3.5–5.2)
Alkaline Phosphatase: 258 U/L — ABNORMAL HIGH (ref 39–117)
BUN: 11 mg/dL (ref 6–23)
CO2: 20 mEq/L (ref 19–32)
Calcium: 8.1 mg/dL — ABNORMAL LOW (ref 8.4–10.5)
Chloride: 102 mEq/L (ref 96–112)
Creatinine, Ser: 0.99 mg/dL (ref 0.50–1.35)
GFR calc Af Amer: >90 mL/min (ref >90)
GFR calc non Af Amer: 87 mL/min — ABNORMAL LOW (ref >90)
Glucose, Bld: 240 mg/dL — ABNORMAL HIGH (ref 70–99)
Potassium: 4.3 mEq/L (ref 3.5–5.1)
Sodium: 130 mEq/L — ABNORMAL LOW (ref 135–145)
Total Bilirubin: 0.9 mg/dL (ref 0.3–1.2)
Total Protein: 7.4 g/dL (ref 6.0–8.3)

## 2012-03-09 LAB — GLUCOSE, CAPILLARY
Glucose-Capillary: 203 mg/dL — ABNORMAL HIGH (ref 70–99)
Glucose-Capillary: 134 mg/dL — ABNORMAL HIGH (ref 70–99)
Glucose-Capillary: 135 mg/dL — ABNORMAL HIGH (ref 70–99)
Glucose-Capillary: 185 mg/dL — ABNORMAL HIGH (ref 70–99)
Glucose-Capillary: 77 mg/dL (ref 70–99)

## 2012-03-09 LAB — CULTURE, BLOOD (ROUTINE X 2): Culture: NO GROWTH

## 2012-03-09 MED ORDER — INSULIN ASPART 100 UNIT/ML ~~LOC~~ SOLN
0.0000 [IU] | Freq: Three times a day (TID) | SUBCUTANEOUS | Status: DC
  Administered 2012-03-09 – 2012-03-10 (×2): 4 [IU] via SUBCUTANEOUS

## 2012-03-09 MED ORDER — WHITE PETROLATUM GEL
Status: AC
  Filled 2012-03-09: qty 5

## 2012-03-09 NOTE — Progress Notes (Signed)
12 Days Post-Op  Subjective: Patient is much more awake and alert - still with limited short term memory Ostomy appliance in place - no leak currently; large output after meals - PRN imodium Awaiting SNF placement  Objective: Vital signs in last 24 hours: Temp:  [98.5 F (36.9 C)-99.8 F (37.7 C)] 98.9 F (37.2 C) (10/07 0612) Pulse Rate:  [69-81] 69  (10/07 0612) Resp:  [18-20] 18  (10/07 0612) BP: (118-149)/(65-79) 149/79 mmHg (10/07 0612) SpO2:  [100 %] 100 % (10/07 0612) FiO2 (%):  [21 %] 21 % (10/06 2303) Last BM Date: 03/08/12  Intake/Output from previous day: 10/06 0701 - 10/07 0700 In: 800 [P.O.:800] Out: 2325 [Urine:1190; Drains:85; Stool:1050] Intake/Output this shift: Total I/O In: -  Out: 225 [Urine:225]  General appearance: alert, cooperative and no distress Resp: clear to auscultation bilaterally GI: soft, non-tender; bowel sounds normal; no masses,  no organomegaly Ostomy - pink; good output; Wound - VAC removed - lower half is completely healed; upper half is well granulated and clean; VAC replaced   Lab Results:  No results found for this basename: WBC:2,HGB:2,HCT:2,PLT:2 in the last 72 hours BMET  Basename 03/09/12 0515 03/07/12 0600  NA 130* 131*  K 4.3 4.6  CL 102 104  CO2 20 20  GLUCOSE 240* 124*  BUN 11 12  CREATININE 0.99 0.93  CALCIUM 8.1* 8.0*   PT/INR No results found for this basename: LABPROT:2,INR:2 in the last 72 hours ABG No results found for this basename: PHART:2,PCO2:2,PO2:2,HCO3:2 in the last 72 hours  Studies/Results: No results found.  Anti-infectives: Anti-infectives     Start     Dose/Rate Route Frequency Ordered Stop   02/26/12 0000   vancomycin (VANCOCIN) 1,250 mg in sodium chloride 0.9 % 250 mL IVPB  Status:  Discontinued        1,250 mg 166.7 mL/hr over 90 Minutes Intravenous Every 12 hours 02/25/12 1241 02/29/12 1118   02/22/12 0000   vancomycin (VANCOCIN) IVPB 1000 mg/200 mL premix  Status:  Discontinued        1,000 mg 200 mL/hr over 60 Minutes Intravenous Every 12 hours 02/21/12 0929 02/25/12 1241   02/21/12 1000   fluconazole (DIFLUCAN) IVPB 400 mg  Status:  Discontinued        400 mg 200 mL/hr over 60 Minutes Intravenous Every 24 hours 02/21/12 0929 03/01/12 1232   02/20/12 1000   vancomycin (VANCOCIN) 1,500 mg in sodium chloride 0.9 % 500 mL IVPB        1,500 mg 250 mL/hr over 120 Minutes Intravenous Every 24 hours 02/19/12 0942 02/21/12 1246   02/19/12 1000   piperacillin-tazobactam (ZOSYN) IVPB 3.375 g  Status:  Discontinued        3.375 g 12.5 mL/hr over 240 Minutes Intravenous 3 times per day 02/19/12 0942 03/01/12 1232   02/19/12 1000   vancomycin (VANCOCIN) 2,000 mg in sodium chloride 0.9 % 500 mL IVPB        2,000 mg 250 mL/hr over 120 Minutes Intravenous  Once 02/19/12 0942 02/19/12 1335   02/16/12 1615   ertapenem (INVANZ) 1 g in sodium chloride 0.9 % 50 mL IVPB        1 g 100 mL/hr over 30 Minutes Intravenous To Surgery 02/16/12 1606 02/16/12 1733   02/16/12 1600   ertapenem (INVANZ) 1 g in sodium chloride 0.9 % 50 mL IVPB  Status:  Discontinued        1 g 100 mL/hr over 30 Minutes Intravenous Every 24  hours 02/16/12 1508 02/19/12 0909   02/16/12 1415   cefTRIAXone (ROCEPHIN) 1 g in dextrose 5 % 50 mL IVPB        1 g 100 mL/hr over 30 Minutes Intravenous  Once 02/16/12 1407 02/16/12 1448          Assessment/Plan: s/p Procedure(s) (LRB) with comments: EXPLORATORY LAPAROTOMY (N/A) -   APPLICATION OF WOUND VAC (N/A) Awaiting SNF placement Continue Physical therapy for strengthening Ostomy education Electrolyte management/ DM management per internal medicine - fluid restriction  Progressing very well.  LOS: 22 days    Hyla Coard K. 03/09/2012

## 2012-03-09 NOTE — Progress Notes (Signed)
Rehab Admissions Coordinator Note:  Patient was screened by Cleatrice Burke for appropriateness for an Inpatient Acute Rehab Consult.  We previously consulted on pt 03/03/12. Felt that pt would need a prolonged rehab stay before returning home. Noted improvement in mobility with P.T. today at a minguard to supervision level. I continue to recommend SNF rehab for pt has multiple medical issues that will require prolonged rehab stay that include high output ostomy, wound care issues, fatigue, and confusion requiring a sitter. This issues will not resolve in a short 10 - 14 inpt rehab stay. Therefore at this time, we are recommending [facility] as arranged.  Cleatrice Burke 03/09/2012, 7:07 PM  I can be reached at [phone].

## 2012-03-09 NOTE — Progress Notes (Signed)
Pt puts himslef on and off the cpap

## 2012-03-09 NOTE — Progress Notes (Signed)
Nutrition Follow-up  Intervention:   1.  Supplements; RD to order magic cups in the event pt stays for dinner service.  Assessment:   Pt intake has improved to 75% of meals. Note pt for possible d/c to SNF this afternoon. Pt did not receive Ensures over the weekend due to initiation of fluid restriction.   Diet Order:  CHO Modified Medium, Ensure Complete BID, MVI  Meds: Scheduled Meds:    . atorvastatin  10 mg Oral q1800  . carvedilol  12.5 mg Oral BID WC  . feeding supplement  237 mL Oral BID BM  . gabapentin  300 mg Oral BID  . insulin aspart  0-20 Units Subcutaneous Q4H  . insulin aspart protamine-insulin aspart  10 Units Subcutaneous BID WC  . multivitamin with minerals  1 tablet Oral Daily  . pantoprazole  40 mg Oral QHS  . sodium chloride  10-40 mL Intracatheter Q12H  . white petrolatum       Continuous Infusions:    . sodium chloride 20 mL/hr at 03/07/12 0248   PRN Meds:.alum & mag hydroxide-simeth, fentaNYL, haloperidol lactate, loperamide, metoprolol, sodium chloride  Labs:  CMP     Component Value Date/Time   NA 130* 03/09/2012 0515   K 4.3 03/09/2012 0515   CL 102 03/09/2012 0515   CO2 20 03/09/2012 0515   GLUCOSE 240* 03/09/2012 0515   BUN 11 03/09/2012 0515   CREATININE 0.99 03/09/2012 0515   CREATININE 1.12 07/04/2011 1516   CALCIUM 8.1* 03/09/2012 0515   PROT 7.4 03/09/2012 0515   ALBUMIN 2.0* 03/09/2012 0515   AST 52* 03/09/2012 0515   ALT 32 03/09/2012 0515   ALKPHOS 258* 03/09/2012 0515   BILITOT 0.9 03/09/2012 0515   GFRNONAA 87* 03/09/2012 0515   GFRAA >90 03/09/2012 0515   CBG (last 3)   Basename 03/09/12 1205 03/09/12 0824 03/09/12 0400  GLUCAP 135* 134* 203*    Intake/Output Summary (Last 24 hours) at 03/09/12 1423 Last data filed at 03/09/12 1322  Gross per 24 hour  Intake    720 ml  Output   2425 ml  Net  -1705 ml    Weight Status:  203 lbs, inconsistent with previous wts Wt range: 221-243 lbs since admission  Re-estimated needs:   1900-2000 kcal, 120-142g protein  Nutrition Dx:  Inadequate oral intake, ongoing  Monitor:   1. Food/Beverage; pt with tolerance of PO diet. Pt to consume >50% of meals and supplements.  Somewhat meal, pt improving but not receiving supplements 3. Wt/wt change; monitor trends.  Ongoing.   Brynda Greathouse, MS RD LDN Clinical Inpatient Dietitian Pager: [phone] Weekend/After hours pager: [phone]

## 2012-03-09 NOTE — Clinical Social Work Note (Addendum)
Clinical Social Worker spoke with patient's daughter, Joseph Art regarding the only bed offer of [facility]. Daughter is in agreement for this facility. This facility is able to receive patient today if medically stable. Patient's daughter also reported that she will be able to transport patient to the facility at discharge if it is today. CSW will continue to follow and facilitate discharge when medically stable.   CSW spoke with Levada Dy at [facility] and she will need patient's medication list to be sent by 3pm today.   Leandro Reasoner MSW, [place]

## 2012-03-09 NOTE — Progress Notes (Signed)
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 Pt stable   Acute respiratory failure and OSA  has an intermittent mild stridor which is much worse when he is asleep - may be vocal cord edema due to the recent intubation (12 days) - PER RN, he tends to develop tachypnea and then an increase in his stridor but never becomes hypoxic and never complains of difficulty breathing, monitor clinically with when necessary oxygen and nebulizer treatments and daytime if needed.    Mild hyponatremia recheck CMP in am. He has high volume GI loss of electrolytes including sodium through his stoma.  NA is 131 and he feels thirsty at times. Urine studies show OSM 265, NA 18 at this time. Serum OSM 278  He is on 1000 cc fluid restriction at this time. Sodium has been stable for the last 3 days, would repeat BMP in one week post discharge   Anemia of chronic disease  Stable at his baseline   Type II diabetes mellitus, well controlled  A1C 6.8 - cont insulin sliding scale for now- hold off on Metformin at discharge - CBGs currently well contrrolled  CBG (last 3) , continue to check CBG q. a.c. each bedtime. 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    ARF (acute renal failure)  resolved - creatinine is within normal limits - watch for return of renal impairment as ileostomy output is high , will check BMP intermittently. Probably weekly post discharge  Hx of HTN  Hypotension was initially a concern - BP has now improved/rebounded to HTN range - currently stable on Coreg we'll continue to monitor and adjust medications as needed.    Hx of Hyperlipidemia  Was on lipitor as outpt - will add low-dose at this time is tolerating oral diet.    Protein-calorie malnutrition, severe  nutrition following - TNA being weaned by primary team, on oral diet along with routine supplement.    Obesity (BMI 30-34.9)  Outpatient followup with PCP   Code Status: Full  Family Communication: talked to daughter and answered her questions.  Disposition Plan: Stable for discharge from medical standpoint     HPI/Subjective:  Patient is much more awake and alert - still with limited short term memory  Ostomy appliance in place - no leak currently; large output after meals - PRN imodium  Awaiting SNF placement   Objective: Filed Vitals:   03/08/12 1423 03/08/12 2100 03/08/12 2303 03/09/12 0612  BP: 144/70 118/65  149/79  Pulse: 70 81 76 69  Temp: 99.8 F (37.7  C) 98.5 F (36.9 C)  98.9 F (37.2 C)  TempSrc: Oral Oral    Resp: 20 19 18 18   Height:      Weight:      SpO2: 100% 100% 100% 100%    Intake/Output Summary (Last 24 hours) at 03/09/12 1037 Last data filed at 03/09/12 1012  Gross per 24 hour  Intake   1040 ml  Output   2075 ml  Net  -1035 ml    Exam:  Gen: lying in bed, NAD. Awake Alert, Oriented X 3, eating [employer] chicken  HEENT : NT, AC  Neck : Supple, No JVD  Chest : GBAE, CTAB   Heart : RRR,No Gallops,Rubs or new Murmurs  Abd : +ve B.Sounds, Abd Soft, Non tender. Ileostomy noted. midline Abd wound Vac drain in place too.  Ext : No Cyanosis, Clubbing or edema    Data Reviewed: Basic Metabolic Panel:  Lab [id number] 0515 03/07/12 0600 03/06/12 0500 03/03/12 0610  NA 130* 131* 130* 133*  K 4.3 4.6 4.3 4.3  CL 102 104 99 100  CO2 20 20 23 26   GLUCOSE 240* 124* 137* 131*  BUN 11 12 16 19   CREATININE 0.99 0.93 1.08 0.95  CALCIUM 8.1* 8.0* 8.2* 8.3*  MG -- -- -- 1.8  PHOS -- -- -- --    Liver Function Tests:  Lab 03/09/12 0515  AST 52*  ALT 32  ALKPHOS 258*  BILITOT 0.9  PROT 7.4  ALBUMIN 2.0*   No results found for this basename: LIPASE:5,AMYLASE:5 in the last 168 hours No results found for this basename: AMMONIA:5 in the last 168 hours  CBC:  Lab 03/06/12 0500 03/05/12 0500  WBC 10.4 9.2  NEUTROABS -- --  HGB 10.2* 10.0*  HCT 29.2* 28.9*  MCV 72.5* 73.4*  PLT 407* 329    Cardiac Enzymes: No results found for this basename: CKTOTAL:5,CKMB:5,CKMBINDEX:5,TROPONINI:5 in the last 168 hours BNP (last 3 results)  Basename 02/21/12 0839  PROBNP 295.9*     CBG:  Lab 03/09/12 0824 03/09/12 0400 03/09/12 0002 03/08/12 2007 03/08/12 1613  GLUCAP 134* 203* 192* 101* 159*    Recent Results (from the past 240 hour(s))  CULTURE, BLOOD (ROUTINE X 2)     Status: Normal (Preliminary result)   Collection Time   03/02/12  6:53 PM      Component Value Range Status Comment   Specimen Description BLOOD LEFT ARM   Final    Special Requests     Final    Value: BOTTLES DRAWN AEROBIC AND ANAEROBIC 10CC AER,9CC ANA   Culture  Setup Time 03/03/2012 02:08   Final    Culture     Final    Value:        BLOOD CULTURE RECEIVED NO GROWTH TO DATE CULTURE WILL BE HELD FOR 5 DAYS BEFORE ISSUING A FINAL NEGATIVE REPORT   Report Status PENDING   Incomplete   CULTURE, BLOOD (ROUTINE X 2)     Status: Normal   Collection Time   03/02/12  7:12 PM      Component Value  Range Status Comment   Specimen Description BLOOD LEFT HAND   Final    Special Requests BOTTLES DRAWN AEROBIC AND ANAEROBIC 10CC   Final    Culture  Setup Time 03/03/2012 02:07   Final    Culture     Final    Value: STAPHYLOCOCCUS SPECIES (COAGULASE NEGATIVE)     Note: THE SIGNIFICANCE OF ISOLATING THIS ORGANISM FROM A  SINGLE SET OF BLOOD CULTURES WHEN MULTIPLE SETS ARE DRAWN IS UNCERTAIN. PLEASE NOTIFY THE MICROBIOLOGY DEPARTMENT WITHIN ONE WEEK IF SPECIATION AND SENSITIVITIES ARE REQUIRED.     1 Note: Gram Stain Report Called to,Read Back By and Verified With: [facility] [place] AT 11:59PM 10 2013 BY [place]   Report Status 03/05/2012 FINAL   Final      Studies: Dg Chest 2 View  02/08/2012  *RADIOLOGY REPORT*  Clinical Data: Shortness of breath, congestion, fever and dyspnea. Status post colectomy.  CHEST - 2 VIEW  Comparison: Chest x-ray 01/30/2012.  Findings: Lung volumes are low.  The patient is in a very lordotic position.  With these limitations in mind, there appears to be an opacity in the medial aspect of the left lower lobe with some air bronchograms. Trace bilateral pleural effusions.  Mild crowding of the pulmonary vasculature, accentuated by patient's low lung volumes, without frank pulmonary edema.  Heart size appears borderline enlarged, likely accentuated by patient positioning. Mediastinal contours appear widened, also likely related to low lung volumes and lordotic positioning.  IMPRESSION: 1.  Low lung volumes with atelectasis and/or consolidation in the medial aspect of the left lower lobe. 2.  Trace bilateral pleural effusions.   Original Report Authenticated By: Etheleen Mayhew, M.D.    Dg Chest Port 1 View  02/29/2012  *RADIOLOGY REPORT*  Clinical Data: Atelectasis.  Pleural effusion.  PORTABLE CHEST - 1 VIEW  Comparison: Chest 02/28/2012.  Findings: Endotracheal tube and NG tube have been removed.  Right PICC remains in place.  Right greater right pleural effusions and airspace  disease appear improved.  No pneumothorax.  Heart size upper normal.  IMPRESSION:  1.  Status post extubation and NG tube removal. 2.  Improved right greater than left pleural effusions and basilar airspace disease.   Original Report Authenticated By: Arvid Right. Luther Parody, M.D.    Dg Chest Port 1 View  02/28/2012  *RADIOLOGY REPORT*  Clinical Data: Pulmonary edema  PORTABLE CHEST - 1 VIEW  Comparison: 02/27/2012  Findings: Endotracheal tube, NG tube,   right hip are stable.  Hazy opacity at both lung bases right greater than left has improved. No pneumothorax.  IMPRESSION: Improved bilateral pleural effusions and bibasilar volume loss right greater than left.   Original Report Authenticated By: Jamas Lav, M.D.    Dg Chest Port 1 View  02/27/2012  *RADIOLOGY REPORT*  Clinical Data: Evaluate endotracheal tube, atelectasis  PORTABLE CHEST - 1 VIEW  Comparison: 02/26/2012; 02/25/2012; 02/24/2012  Findings: Grossly unchanged enlarged cardiac silhouette and mediastinal contours.  Stable position of support apparatus. Pulmonary vasculature remains indistinct.  Grossly unchanged layering bilateral pleural effusions and basilar heterogeneous opacities, right greater than left.  No definite pneumothorax. Unchanged bones.  IMPRESSION: 1.  Stable positioning of support apparatus.  No pneumothorax. 2.  Grossly unchanged findings of pulmonary edema, layering bilateral effusions and bibasilar opacities, right greater than left, possibly atelectasis   Original Report Authenticated By: Rachel Moulds, M.D.    Dg Chest Port 1 View  02/26/2012  *RADIOLOGY REPORT*  Clinical Data: Atelectasis.  PORTABLE CHEST - 1 VIEW  Comparison: 02/25/2012.  Findings: Endotracheal tube is in satisfactory position.  The nasogastric tube is followed into the stomach with the tip projecting beyond the inferior boundary the film.  Right PICC tip projects over the SVC.  Heart size stable.  Diffuse bilateral air space disease and bilateral  pleural effusions persist. Left lower lobe collapse/consolidation.  IMPRESSION: Diffuse bilateral air space  disease, bilateral pleural effusions and left lower lobe collapse/consolidation persist.   Original Report Authenticated By: Luretha Rued, M.D.    Dg Chest Port 1 View  02/25/2012  *RADIOLOGY REPORT*  Clinical Data: Endotracheal tube repositioning.  PORTABLE CHEST - 1 VIEW  Comparison: Earlier the same date and 02/24/2012.  Findings: 0907 hours.  The carina is not well defined.  The endotracheal tube has been slightly advanced, now approximately 3.4 cm above the expected location of the carina.  Nasogastric tube and right arm PICC are unchanged.  The heart size and mediastinal contours are stable.  There are stable basilar opacities and probable bilateral pleural effusions.  No pneumothorax is seen.  IMPRESSION: Slight advancement of the endotracheal tube within the mid trachea. No significant change in basilar airspace opacities and pleural effusions.   Original Report Authenticated By: Vivia Ewing, M.D.    Dg Chest Port 1 View  02/25/2012  *RADIOLOGY REPORT*  Clinical Data: Evaluate endotracheal tube, atelectasis  PORTABLE CHEST - 1 VIEW  Comparison: 02/24/2012; 02/23/2012; 02/22/2012  Findings: Grossly unchanged enlarged cardiac silhouette and mediastinal contours.  Stable position of support apparatus. Likely unchanged layering bilateral pleural effusions and bibasilar heterogeneous opacities, left greater than right.  No new focal airspace opacities.  Pulmonary vasculature remains indistinct.  No definite pneumothorax.  Unchanged bones.  IMPRESSION: 1.  Stable positioning of support apparatus.  No pneumothorax. 2.  Grossly unchanged findings of mild edema small bilateral effusions and bibasilar opacities, left greater than right, possibly atelectasis.   Original Report Authenticated By: Rachel Moulds, M.D.    Dg Chest Port 1 View  02/24/2012  *RADIOLOGY REPORT*  Clinical Data:  Atelectasis follow-up.  PORTABLE CHEST - 1 VIEW  Comparison: 02/23/2012  Findings: Endotracheal tube tip is similar in position above the carina.  NG tube descends below the image.  Heart size upper normal to mildly enlarged.  Central vascular congestion.  Mild perihilar opacity and layering pleural effusions with associated compressive atelectasis or infiltrate.  No pneumothorax.  No acute osseous finding.  IMPRESSION: Heart size upper normal with central vascular congestion. Mild edema suggested.  Right greater than left pleural effusions with associated consolidations, favor compressive atelectasis.   Original Report Authenticated By: Suanne Marker, M.D.    Dg Chest Port 1 View  02/23/2012  *RADIOLOGY REPORT*  Clinical Data: Follow-up evaluation for pleural effusions.  PORTABLE CHEST - 1 VIEW  Comparison: Chest x-ray 02/22/2012.  Findings: An endotracheal tube is in place with tip 4.1 cm above the carina. A nasogastric tube is seen extending into the stomach, however, the tip of the nasogastric tube extends below the lower margin of the image.  Lung volumes are low.  No consolidative airspace disease.  Slight hazy opacity projecting over the lower right hemithorax may represent a small posterior layering right- sided pleural effusion.  No left pleural effusion.  No pneumothorax.  Left lower lobe subsegmental atelectasis.  Pulmonary vasculature and cardiomediastinal silhouette are within normal limits.  IMPRESSION: 1.  Support apparatus, as above. 2.  Possible trace right sided pleural effusion layering posteriorly. 3.  Minimal left lower lobe subsegmental atelectasis.   Original Report Authenticated By: Etheleen Mayhew, M.D.    Dg Chest Port 1 View  02/22/2012  *RADIOLOGY REPORT*  Clinical Data: Endotracheal tube positioning.  Ventilator dependent respiratory failure.  Recent partial colectomy.  PORTABLE CHEST - 1 VIEW  Comparison: 02/21/2012  Findings: Endotracheal tube tip is 3.8 cm above the  carina. Nasogastric tube  extends into the stomach.  The right internal jugular line appears to be in the vicinity of the SVC confluence.  There is a suggestion of left ventricular enlargement based on cardiac contour. Low lung volumes are present, causing crowding of the pulmonary vasculature.  Vague density at the right lung base does not silhouette the hemidiaphragm and could represent layering effusion.  There is minimal airspace opacity medially at the left lung base as well.  IMPRESSION:  1.  Endotracheal tube is satisfactorily positioned, tip 3.8 cm above the carina. 2.  Right IJ line tip is at the confluence of the SVC. 3.  Mild retrocardiac airspace opacity in the left lower lobe, potentially atelectasis or pneumonia. 4.  Hazy opacity over the right lung base, query small layering pleural effusion.   Original Report Authenticated By: Carron Curie, M.D.    Dg Chest Port 1 View  02/21/2012  *RADIOLOGY REPORT*  Clinical Data: Ventilator dependent respiratory failure.  Recent partial colectomy.  Follow up basilar atelectasis.  PORTABLE CHEST - 1 VIEW  Comparison: Portable chest x-rays yesterday and dating back to 02/17/2012.  Findings: Endotracheal tube tip in satisfactory position projecting approximately 5 cm above the carina.  Right jugular central venous catheter tip projects over the upper SVC.  Nasogastric tube courses below the diaphragm into the stomach.  Suboptimal inspiration with worsening atelectasis in the lung bases.  Lungs otherwise clear. Cardiac silhouette enlarged.  Pulmonary vascularity normal.  IMPRESSION: Support apparatus satisfactory.  Worsening bibasilar atelectasis. Stable cardiomegaly without pulmonary edema.   Original Report Authenticated By: Deniece Portela, M.D.    Dg Chest Port 1 View  02/20/2012  *RADIOLOGY REPORT*  Clinical Data: Status post intubation.  PORTABLE CHEST - 1 VIEW  Comparison: One view chest 02/20/2012 at 02:06 a.m.  Findings: Endotracheal tube  terminates 2.2 cm above the carina and could be pulled back 1-2 cm for more optimal positioning.  The right IJ line and NG tube are stable.  Aeration is slightly improved.  Lung volumes remain low with mild bibasilar atelectasis.  IMPRESSION:  1.  The endotracheal tube terminates 2.2 cm above the carina and could be pulled back 1-2 cm for more optimal positioning. 2.  Support apparatus is otherwise stable. 3.  Improved aeration with persistent low lung volumes and right greater than left basilar atelectasis.   Original Report Authenticated By: Resa Miner. MATTERN, M.D.    Dg Chest Port 1 View  02/20/2012  *RADIOLOGY REPORT*  Clinical Data: Intubated patient.  PORTABLE CHEST - 1 VIEW  Comparison: Chest 02/19/2012.  Findings: Endotracheal tube is in place with tip in good position approximately 3 cm above the carina.  Right IJ catheter and NG tube again noted.  There is a new right pleural effusion and basilar airspace disease.  Left lung remains clear.  No pneumothorax. Cardiomegaly is noted.  IMPRESSION:  1.  ET tube in good position. 2.  New right pleural effusion with basilar opacity which could be due to atelectasis or pneumonia.   Original Report Authenticated By: Arvid Right. Luther Parody, M.D.    Dg Chest Port 1 View  02/19/2012  *RADIOLOGY REPORT*  Clinical Data: Open abdominal wound status post exploratory laparotomy.  PORTABLE CHEST - 1 VIEW  Comparison: 02/18/2012 and 02/17/2012.  Findings: 0634 hours.  The endotracheal tube, right IJ central venous catheter and nasogastric tube positions are unchanged. Overall lung aeration has improved with mild residual bibasilar atelectasis.  There is no confluent airspace opacity, pneumothorax or significant pleural effusion.  The heart size and mediastinal contours are stable.  IMPRESSION: Interval improved pulmonary aeration.  Stable support system.   Original Report Authenticated By: Vivia Ewing, M.D.    Dg Chest Port 1 View  02/18/2012  *RADIOLOGY  REPORT*  Clinical Data: Check endotracheal tube position  PORTABLE CHEST - 1 VIEW  Comparison: 02/17/2012  Findings: Cardiomediastinal silhouette is stable.  Stable NG tube position.  Endotracheal tube in place with tip 3.8 cm above the carina.  Stable right IJ central line position.  No acute infiltrate or pulmonary edema.  Poor inspiration with mild basilar atelectasis.  IMPRESSION: Stable support apparatus.  Mild basilar atelectasis.   Original Report Authenticated By: Lahoma Crocker, M.D.    Dg Chest Port 1 View  02/17/2012  *RADIOLOGY REPORT*  Clinical Data: Exploratory laparotomy.  Wound vac.  Perforated viscus.  PORTABLE CHEST - 1 VIEW  Comparison: 02/16/2012.  Findings: Endotracheal tube is unchanged.  The right IJ central line has been adjusted and redundant loop in the right paratracheal vascular structures is been reduced.  The tip of the right IJ central line is just superior to the confluence of the brachiocephalic veins.  Nasogastric tube is unchanged.  Low volumes with basilar atelectasis.  No airspace disease.  Cardiopericardial silhouette within normal limits for projection and inspiration.  IMPRESSION:  1.  Reduction of redundant loop from right IJ central line. 2.  Other support apparatus stable. 3.  Low volumes with basilar atelectasis.   Original Report Authenticated By: Dereck Ligas, M.D.    Dg Chest Port 1 View  02/16/2012  *RADIOLOGY REPORT*  Clinical Data: ET tube placement.  PORTABLE CHEST - 1 VIEW  Comparison: PA and lateral chest 02/08/2012.  Findings: NG tube is in place with the tip well positioned in the stomach.  Endotracheal tube is also identified with the tip 2.1 cm above the carina.  Right IJ catheter is looped in the inferior vena cava with the tip projecting retrograde near the right subclavian vein.  No pneumothorax is identified.  Mild atelectasis in the right lung base is noted.  Left lung clear.  Cardiomegaly.  Lucency along the left heart border could be due to  pneumopericardium.  IMPRESSION:  1.  ET tube in good position. 2.  Right IJ catheter is looped in the superior vena cava with the tip projecting retrograde in the right subclavian vein. 3.  Question pneumopericardium.   Original Report Authenticated By: Arvid Right. Luther Parody, M.D.    Dg Chest Port 1v Same Day  03/02/2012  *RADIOLOGY REPORT*  Clinical Data: Cough and shortness of breath.  PORTABLE CHEST - 1 VIEW SAME DAY  Comparison: 02/29/2012.  Findings: Improved inspiration.  The cardiac silhouette remains borderline enlarged.  Improved aeration at the lung bases with minimal residual atelectasis at the medial right lung base.  No visible pleural fluid on this image.  Right PICC tip in the inferior aspect of the superior vena cava.  Thoracic spine degenerative changes.  IMPRESSION:  1.  Improved inspiration with resolved left basilar atelectasis and almost completely resolved right basilar atelectasis. 2.  Right PICC tip in the inferior aspect of the superior vena cava.  If a position at the cavoatrial junction is desired, it would be recommended that this be advanced 2 cm.   Original Report Authenticated By: Gerald Stabs, M.D.    Dg Abd 2 Views  02/08/2012  *RADIOLOGY REPORT*  Clinical Data: Status post partial colectomy.  Evaluate for potential postoperative ileus.  ABDOMEN -  2 VIEW  Comparison: No priors.  Findings: There is a paucity of bowel gas.  There appear to be some dilated loops of bowel in the right upper quadrant of the abdomen measuring up to 5.1 cm in diameter.  These are not clearly colonic, and are suspicious for dilated loops of small bowel.  Some air fluid levels are noted on the left lateral decubitus view.  No gross pneumoperitoneum. A small volume of gas is present within the stomach.  Multiple midline surgical staples and a couple of right- sided surgical staples are also noted.  IMPRESSION: 1.  Nonspecific bowel gas pattern, as above, concerning for potential small bowel obstruction.  This may alternatively represent a regional small bowel ileus. 2.  No pneumoperitoneum.  These results were called by telephone on 02/08/2012 at 01:45 p.m. to Dr. Margot Chimes, who verbally acknowledged these results.   Original Report Authenticated By: Etheleen Mayhew, M.D.    Dg Abd Acute W/chest  02/16/2012  *RADIOLOGY REPORT*  Clinical Data: Abdominal pain and bloating.  Partial colectomy on 02/06/2012.  Hypertension.  Diabetes.  ACUTE ABDOMEN SERIES (ABDOMEN 2 VIEW & CHEST 1 VIEW)  Comparison: 02/11/2012  Findings: Large amount of pneumoperitoneum.  Given the volume, this is abnormal despite the patient having had surgery 10 days ago. Also, the prior pneumoperitoneum has resolved back on 02/11/2012, so this represents new pneumoperitoneum.  The appearance is compatible with a perforated viscus.  Abnormal dilated loops of small bowel noted with scattered air- fluid levels.  Small bowel loops measure up to 5 cm in diameter. Probable obstruction favored over ileus.  Skin clips noted.  Low lung volumes are present on the chest radiograph, with subsegmental atelectasis in the lung bases.  IMPRESSION:  1.  Prominent (and new from 02/11/2012) pneumoperitoneum.  At 10 days out from abdominal surgery, and this amount of pneumoperitoneum is indicative of a perforated viscus.  Emergent surgical consultation recommended. 2.  Bibasilar subsegmental atelectasis. 3.  Air fluid levels in dilated small bowel favoring small bowel obstruction over ileus.  Critical Value/emergent results were called by telephone to Dr. Verl Dicker (who verbally acknowledged these results) at the time of interpretation on 02/16/2012 at 2:08 p.m.   Original Report Authenticated By: Carron Curie, M.D.    Dg Abd Acute W/chest  02/11/2012  *RADIOLOGY REPORT*  Clinical Data: Right-sided abdominal pain.  Partial colectomy 02/06/2012.  ACUTE ABDOMEN SERIES (ABDOMEN 2 VIEW & CHEST 1 VIEW)  Comparison: 02/08/2012  Findings: Lung volumes are low  with curvilinear bilateral lower lobe atelectasis.  No free air beneath the diaphragms.  No pleural effusion.  The lungs are otherwise clear.  Heart size normal.  Mid abdominal clips are present.  Diffuse mild dilatation of multiple loops small bowel with multiple differential air-fluid levels.  Overall, the extent of gaseous bowel dilatation is increased.  Colon is not dilated but does contain gas.  IMPRESSION: Diffuse small bowel dilatation with gas filled colon which could indicate postoperative ileus, although a distal small bowel obstruction could have a similar appearance but would be less likely given the clinical context.  No free air.  Bilateral lower lobe atelectasis.   Original Report Authenticated By: Arline Asp, M.D.     Scheduled Meds:   . atorvastatin  10 mg Oral q1800  . carvedilol  12.5 mg Oral BID WC  . feeding supplement  237 mL Oral BID BM  . gabapentin  300 mg Oral BID  . insulin aspart  0-20 Units Subcutaneous  Q4H  . insulin aspart protamine-insulin aspart  10 Units Subcutaneous BID WC  . multivitamin with minerals  1 tablet Oral Daily  . pantoprazole  40 mg Oral QHS  . sodium chloride  10-40 mL Intracatheter Q12H  . white petrolatum       Continuous Infusions:   . sodium chloride 20 mL/hr at 03/07/12 0248    Principal Problem:  *Peritonitis Active Problems:  Type II diabetes mellitus, well controlled  OBSTRUCTIVE SLEEP APNEA  Obesity (BMI 30.0-34.9)  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  ARF (acute renal failure)  Acute respiratory failure with hypoxia  Acidosis  Hypotension  Severe sepsis(995.92)  Protein-calorie malnutrition, severe  Hypomagnesemia    Time spent: 40 minutes   [facility] Pager [phone]. If 8PM-8AM, please contact night-coverage at [email or web address], password [facility] 03/09/2012, 10:37 AM  LOS: 22 days

## 2012-03-09 NOTE — Discharge Summary (Addendum)
Physician Discharge Summary  Patient ID: [name] MRN: [id number] DOB/AGE: 1951/03/18 61 y.o.  Admit date: 02/16/2012 Discharge date: 03/10/12  Admission Diagnoses:  Sepsis   Free intraperitoneal air   Intra-abdominal abscess  Discharge Diagnoses: Free intraperitoneal air    Intra-abdominal abscess secondary to anastomotic leak    Acute renal failure - resolved    Right colon cancer  Principal Problem:  *Peritonitis Active Problems:  Type II diabetes mellitus, well controlled  OBSTRUCTIVE SLEEP APNEA  Obesity (BMI 30.0-34.9)  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  ARF (acute renal failure)  Acute respiratory failure with hypoxia  Acidosis  Hypotension  Severe sepsis(995.92)  Protein-calorie malnutrition, severe  Hypomagnesemia   Discharged Condition: good  Hospital Course: This patient underwent a right hemicolectomy on 02/06/12 for a invasive adenocarcinoma of the right colon. He was discharged, but presented to the emergency department with sepsis on 02/16/12.  He was noted to have a large amount of free intraperitoneal air. He was hemodynamically unstable. He was brought immediately to the operating room by Dr. Georganna Skeans and underwent exploratory laparotomy. He was found to have a significant amount of intra-abdominal abscess. He was also noted to have a small leak at the staple line of the common enterotomy of his anastomosis between the right colon and the terminal ileum. 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 A long Hartman's pouch was left. Due to the abdominal swelling the patient's abdomen was left open and abdominal VAC was placed. He was brought back to the operating room multiple times for further washout. He finally had his abdomen closed on 02/26/12. A wound VAC was placed. He was weaned off of the ventilator.Initially he had significant delirium but this has slowly improved. Physical therapy was consult to work with the  patient. He was transferred out of the intensive care unit and try a hospitalist helped to manage his multiple medical issues. He has slowly improved for physical therapy felt that he would require further inpatient rehabilitation or a skilled nursing facility.  We continued his wound VAC up until the day of discharge. I personally checked his wound on the day prior to discharge and this was well granulated. The lower half of the wound is completely healed. The upper wound is fairly superficial and has some visible fascial sutures but is otherwise clean. His ostomy is functioning well. Occasionally does have large liquid ileostomy output which requires when necessary Imodium. We are trying to obtain a consistency similar to pancake batter rather than water.He will need to keep his ileostomy for at least 5 months prior to considering reversal.  The patient also developed some hyponatremia which required a fluid restriction. This slowly improved. The patient has been slowly advanced to a carbohydrate modified diabetic diet. He is being discharged to a skilled nursing facility.  Consults: pulmonary/intensive care and internal medicine  Significant Diagnostic Studies: CT scan at admission Lab Results  Component Value Date   CREATININE 0.99 03/09/2012   BUN 11 03/09/2012   NA 130* 03/09/2012   K 4.3 03/09/2012   CL 102 03/09/2012   CO2 20 03/09/2012   Lab Results  Component Value Date   CALCIUM 8.1* 03/09/2012   PHOS 3.0 03/02/2012   Lab Results  Component Value Date   WBC 10.4 03/06/2012   HGB 10.2* 03/06/2012   HCT 29.2* 03/06/2012   MCV 72.5* 03/06/2012   PLT 407* 03/06/2012    Treatments: surgery: Exploratory laparotomy/ colon resection/ ileostomy; multiple  reexplorations for abdominal VAC change; abdominal closure  Abdominal wound VAC  Physical therapy  Discharge Exam: Blood pressure 132/65, pulse 66, temperature 98.5 F (36.9 C), temperature source Oral, resp. rate 19, height 5\' 11"   (1.803 m), weight 203 lb 4.2 oz (92.2 kg), SpO2 100.00%. General appearance: alert, cooperative and icteric GI: soft, non-tender; bowel sounds normal; no masses,  no organomegaly RLQ ileostomy - pink with yellowish liquid output Midline wound - pink, well-granulated; minimal drainage   Disposition: Skilled nursing facility Daily wet to dry dressing changes to midline incision If the patient has a large amount of loose ileostomy output (more than 1000 ml in a 24 hour period), he should have a PRN dose of Imodium to thicken the output to avoid dehydration. He may need sliding scale insulin if his PO intake is irregular..    Medication List     As of 03/09/2012  7:47 PM    ASK your doctor about these medications         atorvastatin 20 MG tablet   Commonly known as: LIPITOR   Take 20 mg by mouth at bedtime.      carvedilol 6.25 MG tablet   Commonly known as: COREG   Take 1 tablet (6.25 mg total) by mouth 2 (two) times daily with a meal.      insulin NPH-insulin regular (70-30) 100 UNIT/ML injection   Commonly known as: NOVOLIN 70/30   Inject 10-30 Units into the skin 2 (two) times daily with a meal. Inject 30 units in the morning and 10 units at bedtime.      metFORMIN 500 MG tablet   Commonly known as: GLUCOPHAGE   Take 1,000 mg by mouth 2 (two) times daily with a meal.      oxyCODONE-acetaminophen 5-325 MG per tablet   Commonly known as: PERCOCET/ROXICET   Take 1-2 tablets by mouth every 6 (six) hours as needed.      Imodium 2 mg PO qid PRN loose ileostomy output     Follow-up Information    Follow up with Jacquiline Zurcher K., MD. Schedule an appointment as soon as possible for a visit in 2 weeks.   Contact information:   [address] [place] [address] [phone]          Signed: Maia Petties. 03/09/2012, 7:47 PM

## 2012-03-09 NOTE — Progress Notes (Signed)
Inpatient Diabetes Program Recommendations  AACE/ADA: New Consensus Statement on Inpatient Glycemic Control (2013)  Target Ranges:  Prepandial:   less than 140 mg/dL      Peak postprandial:   less than 180 mg/dL (1-2 hours)      Critically ill patients:  140 - 180 mg/dL    Results for MOKSH, SIGGINS (MRN [id number]) as of 03/09/2012 13:28  Ref. Range 03/09/2012 00:02 03/09/2012 04:00 03/09/2012 08:24 03/09/2012 12:05  Glucose-Capillary Latest Range: 70-99 mg/dL 192 (H) 203 (H) 134 (H) 135 (H)     Inpatient Diabetes Program Recommendations Correction (SSI): Please change Novolog Resistant correction scale (SSI) to tid ac + HS (currently ordered as Q4 hours and patient is eating PO diet).  Note: Will follow. Wyn Quaker RN, MSN, CDE Diabetes Coordinator Inpatient Diabetes Program [phone]

## 2012-03-09 NOTE — Progress Notes (Signed)
Physical Therapy Treatment Patient Details Name: [name] MRN: [id number] DOB: [birth year] Today's Date: 03/09/2012 Time: 1700-1715 PT Time Calculation (min): 15 min  PT Assessment / Plan / Recommendation Comments on Treatment Session  Pt mobility and cognition continue to improve. Pt mobility much improved since initial CIR screen.  Pt will benefit from another CIR as pt should now be appropriate to participate in rehab.  Sitter with pt at end of session.      Follow Up Recommendations  Post acute inpatient     Does the patient have the potential to tolerate intense rehabilitation  Yes, Recommend IP Rehab Screening  Barriers to Discharge        Equipment Recommendations  3 in 1 bedside comode    Recommendations for Other Services Rehab consult;OT consult  Frequency Min 3X/week   Plan Discharge plan remains appropriate;Frequency remains appropriate    Precautions / Restrictions Precautions Precautions: Fall Precaution Comments: wound vac Restrictions Weight Bearing Restrictions: No   Pertinent Vitals/Pain No c/o pain     Mobility  Bed Mobility Bed Mobility: Rolling Right;Right Sidelying to Sit Rolling Right: 6: Modified independent (Device/Increase time);With rail Rolling Left: Not tested (comment) Right Sidelying to Sit: 5: Supervision;With rails;HOB elevated Details for Bed Mobility Assistance: pt attempted to transition directly into sitting from supine but present with increased pain. Instructed pt to roll to right side prior to sitting up.  Transfers Transfers: Sit to Stand;Stand to [employer] Transfers Sit to Stand: 4: Min guard;From bed;With upper extremity assist Stand to Sit: 5: Supervision;To bed;To chair/3-in-1;With upper extremity assist Stand Pivot Transfers: 5: Supervision Details for Transfer Assistance: No assistance required.  VCs for hand placement on bed as pt places hands on walker.  squat pivot to recliner from bed using armrest of   recliner without assistance.    Ambulation/Gait Ambulation/Gait Assistance: 5: Supervision Ambulation Distance (Feet): 150 Feet Assistive device: Rolling walker Ambulation/Gait Assistance Details: Pt required 2 standing rest breaks.  Increased height of walker to aid in pt upright posture.  Pt posture much improved.  Pt demonstrating awareness of posture, correcting it several times without prompting.   Gait Pattern: Trunk flexed;Decreased trunk rotation;Step-to pattern Stairs: No    Exercises     PT Diagnosis:    PT Problem List:   PT Treatment Interventions:     PT Goals Acute Rehab PT Goals PT Goal Formulation: With patient Time For Goal Achievement: 03/15/12 Potential to Achieve Goals: Good Pt will Roll Supine to Left Side: with modified independence;with rail PT Goal: Rolling Supine to Left Side - Progress: Met Pt will go Supine/Side to Sit: with supervision;with HOB 0 degrees PT Goal: Supine/Side to Sit - Progress: Met Pt will go Sit to Supine/Side: with supervision;with HOB 0 degrees PT Goal: Sit to Supine/Side - Progress: Met Pt will go Sit to Stand: with min assist;with upper extremity assist PT Goal: Sit to Stand - Progress: Met Pt will go Stand to Sit: with min assist;with upper extremity assist PT Goal: Stand to Sit - Progress: Met Pt will Transfer Bed to Chair/Chair to Bed: with min assist PT Transfer Goal: Bed to Chair/Chair to Bed - Progress: Met Pt will Ambulate: 51 - 150 feet;with min assist;with rolling walker PT Goal: Ambulate - Progress: Met Additional Goals Additional Goal #1: Pt will verbalize when to wear abd binder PT Goal: Additional Goal #1 - Progress: Discontinued (comment)  Visit Information  Last PT Received On: 03/09/12    Subjective Data  Cognition  Overall Cognitive Status: Appears within functional limits for tasks assessed/performed Arousal/Alertness: Awake/alert Orientation Level: Appears intact for tasks assessed Behavior During  Session: [facility] for tasks performed    Balance  Balance Balance Assessed: Yes Static Sitting Balance Static Sitting - Balance Support: Feet supported;No upper extremity supported Static Sitting - Level of Assistance: 6: Modified independent (Device/Increase time) Static Sitting - Comment/# of Minutes: Pt sat on EOB for 2+ minutes with no LOB.   End of Session PT - End of Session Equipment Utilized During Treatment: Other (comment) (Wound vac) Activity Tolerance: Patient tolerated treatment well Patient left: in chair;with call bell/phone within reach Nurse Communication: Mobility status   GP     [name] 03/09/2012, 6:23 PM Maicie Vanderloop L. Vanassa Penniman DPT [phone]

## 2012-03-10 LAB — GLUCOSE, CAPILLARY: Glucose-Capillary: 169 mg/dL — ABNORMAL HIGH (ref 70–99)

## 2012-03-10 MED ORDER — OXYCODONE-ACETAMINOPHEN 5-325 MG PO TABS: 1.0000 | ORAL_TABLET | Freq: Four times a day (QID) | ORAL | Status: AC | PRN

## 2012-03-10 MED ORDER — LOPERAMIDE HCL 2 MG PO CAPS: 2.0000 mg | ORAL_CAPSULE | Freq: Four times a day (QID) | ORAL | Status: DC | PRN

## 2012-03-10 NOTE — Clinical Social Work Note (Addendum)
Clinical Social Worker facilitated discharge by contacting facility, [facility], and family, daughter Joseph Art regarding discharge today. Daughter prefers for patient to be transported via [employer]. CSW will place discharge packet in shadow chart. CSW will sign off as social work intervention is no longer needed.   12:00pm Ambulance has been called.   Leandro Reasoner MSW, [place]

## 2012-03-10 NOTE — Progress Notes (Signed)
13 Days Post-Op  Subjective: Patient awake and alert.  SNF bed available - ready for discharge. Not suitable for CIR  Objective: Vital signs in last 24 hours: Temp:  [97.9 F (36.6 C)-99.1 F (37.3 C)] 97.9 F (36.6 C) (10/08 0604) Pulse Rate:  [64-74] 64  (10/08 0604) Resp:  [17-19] 17  (10/08 0604) BP: (132-141)/(65-78) 141/71 mmHg (10/08 0500) SpO2:  [100 %] 100 % (10/08 0604) FiO2 (%):  [21 %] 21 % (10/07 1933) Last BM Date: 03/09/12  Intake/Output from previous day: 10/07 0701 - 10/08 0700 In: 1621 [P.O.:960; I.V.:661] Out: 3925 [Urine:2025; Drains:100; Stool:1800] Intake/Output this shift: Total I/O In: 621 [P.O.:120; I.V.:501] Out: 1500 [Urine:775; Drains:100; Stool:625]  GI: soft, non-tender; bowel sounds normal; no masses,  no organomegaly Wound VAC in place; will remove prior to discharge  Lab Results:  No results found for this basename: WBC:2,HGB:2,HCT:2,PLT:2 in the last 72 hours BMET  [facility] 03/09/12 0515  NA 130*  K 4.3  CL 102  CO2 20  GLUCOSE 240*  BUN 11  CREATININE 0.99  CALCIUM 8.1*   PT/INR No results found for this basename: LABPROT:2,INR:2 in the last 72 hours ABG No results found for this basename: PHART:2,PCO2:2,PO2:2,HCO3:2 in the last 72 hours  Studies/Results: No results found.  Anti-infectives: Anti-infectives     Start     Dose/Rate Route Frequency Ordered Stop   02/26/12 0000   vancomycin (VANCOCIN) 1,250 mg in sodium chloride 0.9 % 250 mL IVPB  Status:  Discontinued        1,250 mg 166.7 mL/hr over 90 Minutes Intravenous Every 12 hours 02/25/12 1241 02/29/12 1118   02/22/12 0000   vancomycin (VANCOCIN) IVPB 1000 mg/200 mL premix  Status:  Discontinued        1,000 mg 200 mL/hr over 60 Minutes Intravenous Every 12 hours 02/21/12 0929 02/25/12 1241   02/21/12 1000   fluconazole (DIFLUCAN) IVPB 400 mg  Status:  Discontinued        400 mg 200 mL/hr over 60 Minutes Intravenous Every 24 hours 02/21/12 0929 03/01/12 1232   02/20/12 1000   vancomycin (VANCOCIN) 1,500 mg in sodium chloride 0.9 % 500 mL IVPB        1,500 mg 250 mL/hr over 120 Minutes Intravenous Every 24 hours 02/19/12 0942 02/21/12 1246   02/19/12 1000   piperacillin-tazobactam (ZOSYN) IVPB 3.375 g  Status:  Discontinued        3.375 g 12.5 mL/hr over 240 Minutes Intravenous 3 times per day 02/19/12 0942 03/01/12 1232   02/19/12 1000   vancomycin (VANCOCIN) 2,000 mg in sodium chloride 0.9 % 500 mL IVPB        2,000 mg 250 mL/hr over 120 Minutes Intravenous  Once 02/19/12 0942 02/19/12 1335   02/16/12 1615   ertapenem (INVANZ) 1 g in sodium chloride 0.9 % 50 mL IVPB        1 g 100 mL/hr over 30 Minutes Intravenous To Surgery 02/16/12 1606 02/16/12 1733   02/16/12 1600   ertapenem (INVANZ) 1 g in sodium chloride 0.9 % 50 mL IVPB  Status:  Discontinued        1 g 100 mL/hr over 30 Minutes Intravenous Every 24 hours 02/16/12 1508 02/19/12 0909   02/16/12 1415   cefTRIAXone (ROCEPHIN) 1 g in dextrose 5 % 50 mL IVPB        1 g 100 mL/hr over 30 Minutes Intravenous  Once 02/16/12 1407 02/16/12 1448  Assessment/Plan: s/p Procedure(s) (LRB) with comments: EXPLORATORY LAPAROTOMY (N/A) -   APPLICATION OF WOUND VAC (N/A) Discharge PRN Imodium for large loose ileostomy output  LOS: 23 days    [name] K. 03/10/2012

## 2012-03-10 NOTE — Progress Notes (Signed)
Physical Therapy Treatment Patient Details Name: [name] MRN: [id number] DOB: [birth year] Today's Date: 03/10/2012 Time: 1036-1100 PT Time Calculation (min): 24 min  PT Assessment / Plan / Recommendation Comments on Treatment Session  Patient making great progress since I saw him last week. He is really motivated and plannning on discharging to [place] today.     Follow Up Recommendations  Post acute inpatient     Does the patient have the potential to tolerate intense rehabilitation  No, Recommend SNF  Barriers to Discharge        Equipment Recommendations  3 in 1 bedside comode    Recommendations for Other Services    Frequency Min 3X/week   Plan Discharge plan remains appropriate;Frequency remains appropriate    Precautions / Restrictions Precautions Precautions: Fall Precaution Comments: wound vac Restrictions Weight Bearing Restrictions: No   Pertinent Vitals/Pain     Mobility  Bed Mobility Bed Mobility: Supine to Sit Supine to Sit: 6: Modified independent (Device/Increase time) Sitting - Scoot to Edge of Bed: 6: Modified independent (Device/Increase time) Transfers Sit to Stand: 5: Supervision;From bed;From chair/3-in-1 Stand to Sit: 5: Supervision;To chair/3-in-1 Details for Transfer Assistance: Cues for safe hand placement Ambulation/Gait Ambulation/Gait Assistance: 5: Supervision Ambulation Distance (Feet): 400 Feet Assistive device: Rolling walker Ambulation/Gait Assistance Details: Patient required one seated rest break. Cues for posture and balance with RW Gait Pattern: Step-through pattern;Trunk flexed    Exercises     PT Diagnosis:    PT Problem List:   PT Treatment Interventions:     PT Goals Acute Rehab PT Goals PT Goal: Rolling Supine to Left Side - Progress: Met PT Goal: Supine/Side to Sit - Progress: Met PT Goal: Sit to Stand - Progress: Met PT Goal: Stand to Sit - Progress: Met PT Transfer Goal: Bed to Chair/Chair to Bed -  Progress: Met PT Goal: Ambulate - Progress: Met  Visit Information  Last PT Received On: 03/10/12 Assistance Needed: +1    Subjective Data      Cognition  Overall Cognitive Status: Appears within functional limits for tasks assessed/performed Arousal/Alertness: Awake/alert Orientation Level: Appears intact for tasks assessed Behavior During Session: [name] for tasks performed    Balance     End of Session PT - End of Session Equipment Utilized During Treatment: Other (comment) (wound vac) Activity Tolerance: Patient tolerated treatment well Patient left: in chair;with call bell/phone within reach Nurse Communication: Mobility status   GP     [name] 03/10/2012, 12:05 PM

## 2012-03-10 NOTE — Progress Notes (Signed)
Patient discharged to [facility] via EMS. Paperwork sent with patient. Report called to [place]. Assessment unchanged from this am.

## 2012-03-11 NOTE — Progress Notes (Signed)
I agree with the below note.  Shelah Lewandowsky PT, DPT Pager: [phone]

## 2012-03-12 ENCOUNTER — Other Ambulatory Visit: Payer: Self-pay | Admitting: *Deleted

## 2012-03-12 MED ORDER — INSULIN NPH ISOPHANE & REGULAR (70-30) 100 UNIT/ML ~~LOC~~ SUSP: 10.0000 [IU] | Freq: Two times a day (BID) | SUBCUTANEOUS | Status: DC

## 2012-03-13 NOTE — Telephone Encounter (Signed)
Faxed to pharmacy

## 2012-03-24 ENCOUNTER — Telehealth (INDEPENDENT_AMBULATORY_CARE_PROVIDER_SITE_OTHER): Payer: Self-pay

## 2012-03-24 NOTE — Telephone Encounter (Signed)
The nurse scheduled the follow up for suture removal.  It is on 11/6.  She states he has staples in an open wound and they can't start the wound vac until they are removed.  Please call if you can work out something sooner.  When you call ask for the nurse on [phone]

## 2012-03-29 ENCOUNTER — Emergency Department (HOSPITAL_COMMUNITY): Payer: Medicaid Other

## 2012-03-29 ENCOUNTER — Inpatient Hospital Stay (HOSPITAL_COMMUNITY)
Admission: EM | Admit: 2012-03-29 | Discharge: 2012-04-06 | DRG: 682 | Disposition: A | Discharge status: Home Health | Payer: Medicaid Other | Attending: Internal Medicine | Admitting: Internal Medicine

## 2012-03-29 DIAGNOSIS — L89109 Pressure ulcer of unspecified part of back, unspecified stage: Secondary | ICD-10-CM | POA: Diagnosis present

## 2012-03-29 DIAGNOSIS — E119 Type 2 diabetes mellitus without complications: Secondary | ICD-10-CM | POA: Diagnosis present

## 2012-03-29 DIAGNOSIS — E876 Hypokalemia: Secondary | ICD-10-CM | POA: Diagnosis not present

## 2012-03-29 DIAGNOSIS — Z79899 Other long term (current) drug therapy: Secondary | ICD-10-CM

## 2012-03-29 DIAGNOSIS — E440 Moderate protein-calorie malnutrition: Secondary | ICD-10-CM | POA: Diagnosis present

## 2012-03-29 DIAGNOSIS — E872 Acidosis, unspecified: Secondary | ICD-10-CM | POA: Diagnosis present

## 2012-03-29 DIAGNOSIS — T8132XA Disruption of internal operation (surgical) wound, not elsewhere classified, initial encounter: Secondary | ICD-10-CM | POA: Diagnosis present

## 2012-03-29 DIAGNOSIS — E875 Hyperkalemia: Secondary | ICD-10-CM | POA: Diagnosis present

## 2012-03-29 DIAGNOSIS — Z6828 Body mass index (BMI) 28.0-28.9, adult: Secondary | ICD-10-CM

## 2012-03-29 DIAGNOSIS — I82509 Chronic embolism and thrombosis of unspecified deep veins of unspecified lower extremity: Secondary | ICD-10-CM | POA: Diagnosis present

## 2012-03-29 DIAGNOSIS — L8992 Pressure ulcer of unspecified site, stage 2: Secondary | ICD-10-CM | POA: Diagnosis present

## 2012-03-29 DIAGNOSIS — A498 Other bacterial infections of unspecified site: Secondary | ICD-10-CM | POA: Diagnosis not present

## 2012-03-29 DIAGNOSIS — Y9210 Unspecified residential institution as the place of occurrence of the external cause: Secondary | ICD-10-CM | POA: Diagnosis present

## 2012-03-29 DIAGNOSIS — Z860100 Personal history of colon polyps, unspecified: Secondary | ICD-10-CM

## 2012-03-29 DIAGNOSIS — T81320A Disruption or dehiscence of gastrointestinal tract anastomosis, repair, or closure, initial encounter: Secondary | ICD-10-CM | POA: Diagnosis present

## 2012-03-29 DIAGNOSIS — E871 Hypo-osmolality and hyponatremia: Secondary | ICD-10-CM | POA: Diagnosis present

## 2012-03-29 DIAGNOSIS — Z794 Long term (current) use of insulin: Secondary | ICD-10-CM

## 2012-03-29 DIAGNOSIS — I959 Hypotension, unspecified: Secondary | ICD-10-CM | POA: Diagnosis not present

## 2012-03-29 DIAGNOSIS — B9620 Unspecified Escherichia coli [E. coli] as the cause of diseases classified elsewhere: Secondary | ICD-10-CM | POA: Diagnosis not present

## 2012-03-29 DIAGNOSIS — D649 Anemia, unspecified: Secondary | ICD-10-CM | POA: Diagnosis present

## 2012-03-29 DIAGNOSIS — N390 Urinary tract infection, site not specified: Secondary | ICD-10-CM | POA: Diagnosis not present

## 2012-03-29 DIAGNOSIS — Z932 Ileostomy status: Secondary | ICD-10-CM

## 2012-03-29 DIAGNOSIS — R112 Nausea with vomiting, unspecified: Secondary | ICD-10-CM | POA: Diagnosis present

## 2012-03-29 DIAGNOSIS — Z9049 Acquired absence of other specified parts of digestive tract: Secondary | ICD-10-CM

## 2012-03-29 DIAGNOSIS — Y832 Surgical operation with anastomosis, bypass or graft as the cause of abnormal reaction of the patient, or of later complication, without mention of misadventure at the time of the procedure: Secondary | ICD-10-CM | POA: Diagnosis present

## 2012-03-29 DIAGNOSIS — E785 Hyperlipidemia, unspecified: Secondary | ICD-10-CM | POA: Diagnosis present

## 2012-03-29 DIAGNOSIS — G4733 Obstructive sleep apnea (adult) (pediatric): Secondary | ICD-10-CM | POA: Diagnosis present

## 2012-03-29 DIAGNOSIS — M6281 Muscle weakness (generalized): Secondary | ICD-10-CM | POA: Diagnosis not present

## 2012-03-29 DIAGNOSIS — I10 Essential (primary) hypertension: Secondary | ICD-10-CM | POA: Diagnosis present

## 2012-03-29 DIAGNOSIS — E43 Unspecified severe protein-calorie malnutrition: Secondary | ICD-10-CM | POA: Diagnosis present

## 2012-03-29 DIAGNOSIS — Z9889 Other specified postprocedural states: Secondary | ICD-10-CM

## 2012-03-29 DIAGNOSIS — K929 Disease of digestive system, unspecified: Secondary | ICD-10-CM | POA: Diagnosis present

## 2012-03-29 DIAGNOSIS — N179 Acute kidney failure, unspecified: Principal | ICD-10-CM | POA: Diagnosis present

## 2012-03-29 DIAGNOSIS — F17200 Nicotine dependence, unspecified, uncomplicated: Secondary | ICD-10-CM | POA: Diagnosis present

## 2012-03-29 DIAGNOSIS — D72829 Elevated white blood cell count, unspecified: Secondary | ICD-10-CM | POA: Diagnosis present

## 2012-03-29 DIAGNOSIS — C189 Malignant neoplasm of colon, unspecified: Secondary | ICD-10-CM | POA: Diagnosis present

## 2012-03-29 DIAGNOSIS — M1710 Unilateral primary osteoarthritis, unspecified knee: Secondary | ICD-10-CM | POA: Diagnosis present

## 2012-03-29 LAB — URINALYSIS, MICROSCOPIC ONLY
Glucose, UA: NEGATIVE mg/dL
Leukocytes, UA: NEGATIVE
Nitrite: NEGATIVE
Protein, ur: 30 mg/dL — AB
Specific Gravity, Urine: 1.021 (ref 1.005–1.030)
Urobilinogen, UA: 0.2 mg/dL (ref 0.0–1.0)
pH: 5 (ref 5.0–8.0)

## 2012-03-29 LAB — COMPREHENSIVE METABOLIC PANEL
ALT: 23 U/L (ref 0–53)
AST: 33 U/L (ref 0–37)
Albumin: 3.4 g/dL — ABNORMAL LOW (ref 3.5–5.2)
Alkaline Phosphatase: 130 U/L — ABNORMAL HIGH (ref 39–117)
BUN: 156 mg/dL — ABNORMAL HIGH (ref 6–23)
CO2: 16 mEq/L — ABNORMAL LOW (ref 19–32)
Calcium: 9.3 mg/dL (ref 8.4–10.5)
Chloride: 74 mEq/L — ABNORMAL LOW (ref 96–112)
Creatinine, Ser: 17.38 mg/dL — ABNORMAL HIGH (ref 0.50–1.35)
GFR calc Af Amer: 3 mL/min — ABNORMAL LOW (ref >90)
GFR calc non Af Amer: 3 mL/min — ABNORMAL LOW (ref >90)
Glucose, Bld: 158 mg/dL — ABNORMAL HIGH (ref 70–99)
Potassium: 6.5 mEq/L (ref 3.5–5.1)
Sodium: 120 mEq/L — ABNORMAL LOW (ref 135–145)
Total Bilirubin: 1 mg/dL (ref 0.3–1.2)
Total Protein: 9.7 g/dL — ABNORMAL HIGH (ref 6.0–8.3)

## 2012-03-29 LAB — CBC WITH DIFFERENTIAL/PLATELET
Basophils Absolute: 0 10*3/uL (ref 0.0–0.1)
Basophils Relative: 0 % (ref 0–1)
Eosinophils Absolute: 0 10*3/uL (ref 0.0–0.7)
Eosinophils Relative: 0 % (ref 0–5)
HCT: 36.7 % — ABNORMAL LOW (ref 39.0–52.0)
Hemoglobin: 13.5 g/dL (ref 13.0–17.0)
Lymphocytes Relative: 27 % (ref 12–46)
Lymphs Abs: 4 10*3/uL (ref 0.7–4.0)
MCH: 25.1 pg — ABNORMAL LOW (ref 26.0–34.0)
MCHC: 36.8 g/dL — ABNORMAL HIGH (ref 30.0–36.0)
MCV: 68.3 fL — ABNORMAL LOW (ref 78.0–100.0)
Monocytes Absolute: 0.6 10*3/uL (ref 0.1–1.0)
Monocytes Relative: 4 % (ref 3–12)
Neutro Abs: 10.1 10*3/uL — ABNORMAL HIGH (ref 1.7–7.7)
Neutrophils Relative %: 69 % (ref 43–77)
Platelets: 358 10*3/uL (ref 150–400)
RBC: 5.37 MIL/uL (ref 4.22–5.81)
RDW: 17.8 % — ABNORMAL HIGH (ref 11.5–15.5)
WBC: 14.7 10*3/uL — ABNORMAL HIGH (ref 4.0–10.5)

## 2012-03-29 LAB — LIPASE, BLOOD: Lipase: 143 U/L — ABNORMAL HIGH (ref 11–59)

## 2012-03-29 IMAGING — CT CT ABD-PELV W/ CM
2 of 6 series · 16 of 46 positions shown, 18 images · IV contrast (OMNIPAQUE)
Comparison: Chest and two views abdomen [DATE].

CLINICAL DATA: Status post fall.  History of intra-abdominal
abscess secondary to the anastomotic leak.  History of colon
cancer.

CT ABDOMEN AND PELVIS WITH CONTRAST
TECHNIQUE: Multidetector CT imaging of the abdomen and pelvis was
performed following the standard protocol during bolus
administration of intravenous contrast.
Contrast: 100mL OMNIPAQUE IOHEXOL 300 MG/ML  SOLN

[Series 2: rtn a/p with · axial · 0.81mm/px · z∈[+841,+1241]mm · 13 of 91 slices shown, 15 images]
[im 6/91  soft-tissue]
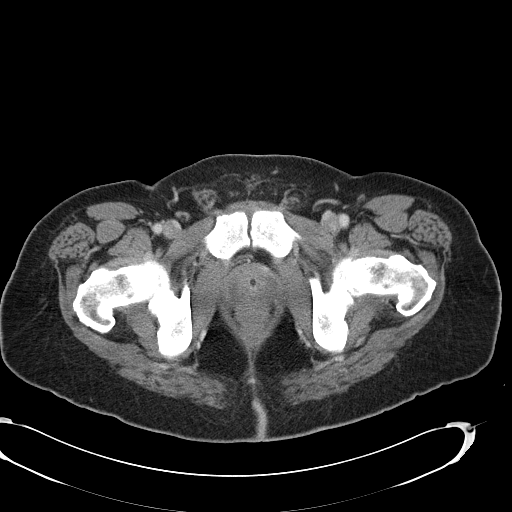
[im 6/91  bone]
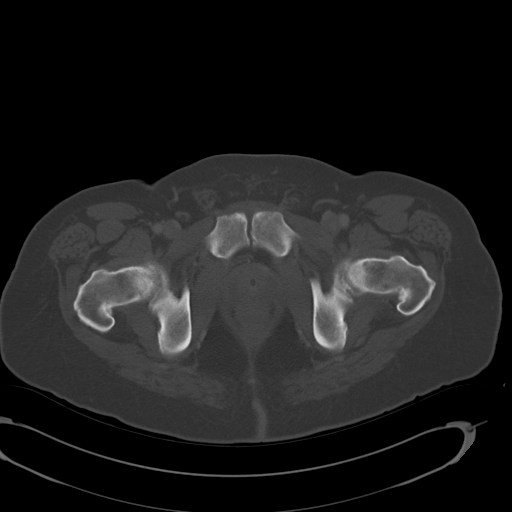
[im 11/91  soft-tissue]
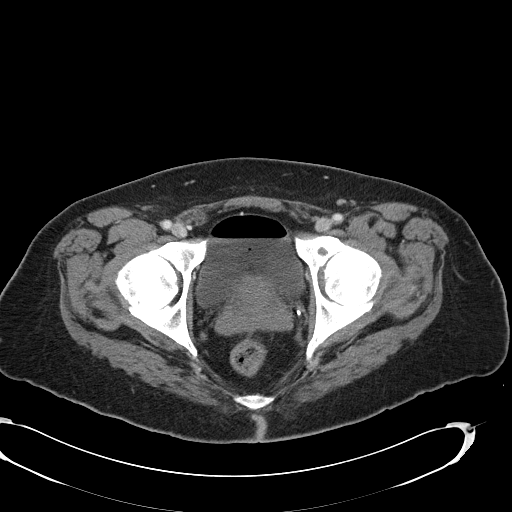
[im 21/91  soft-tissue]
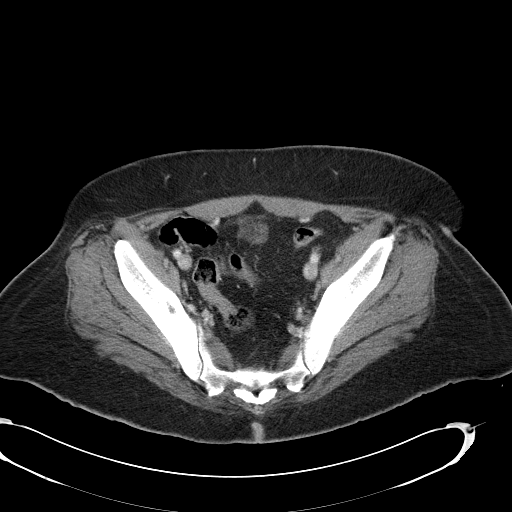
[im 26/91  soft-tissue]
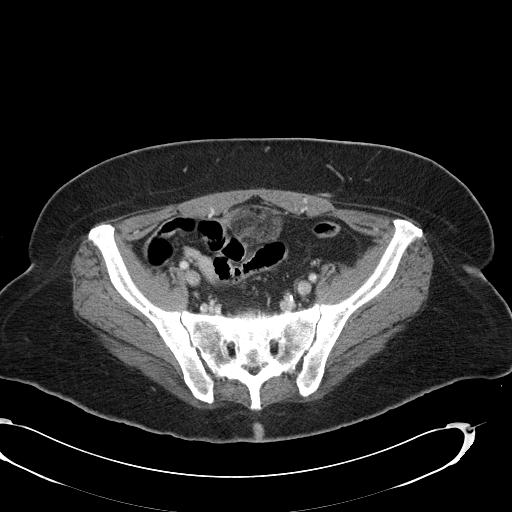
[im 31/91  soft-tissue]
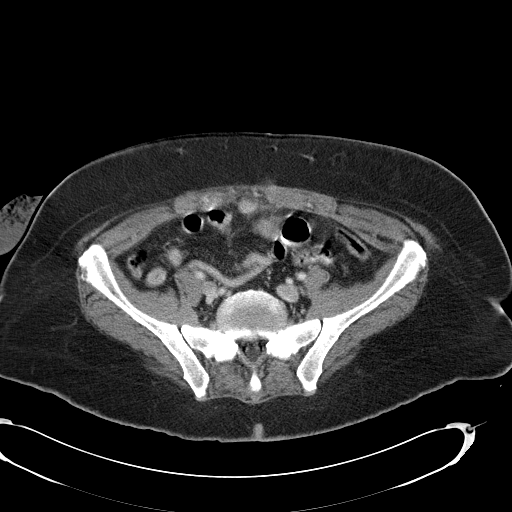
[im 41/91  soft-tissue]
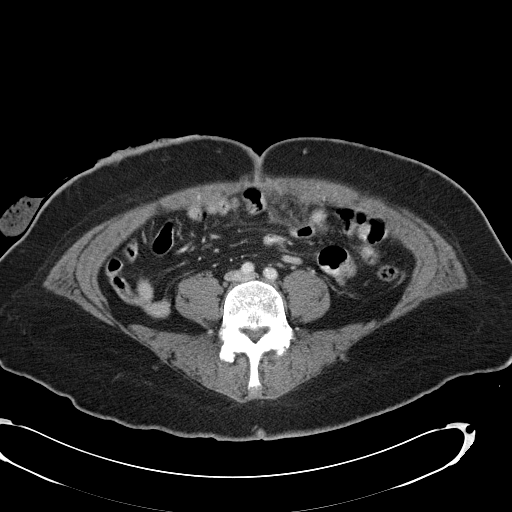
[im 46/91  soft-tissue]
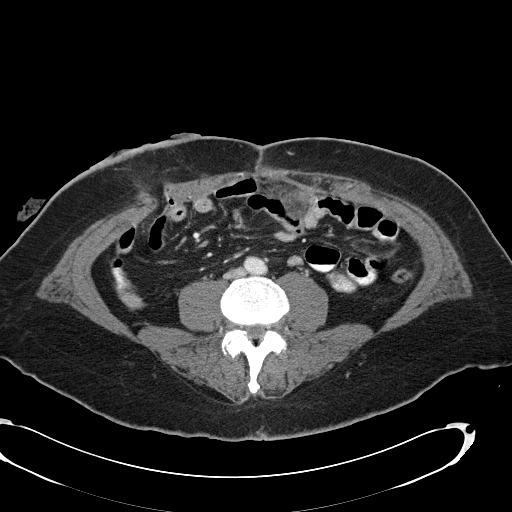
[im 51/91  soft-tissue]
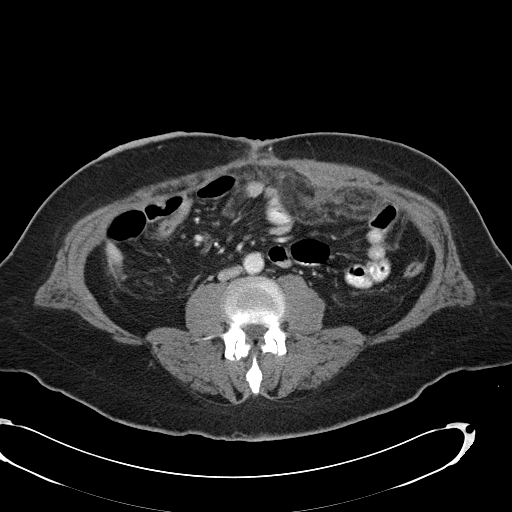
[im 61/91  soft-tissue]
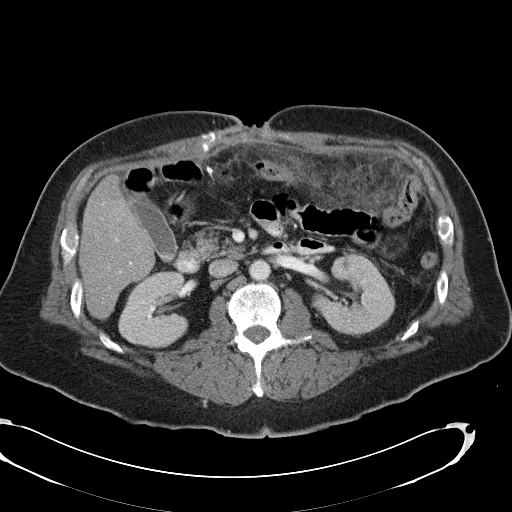
[im 61/91  bone]
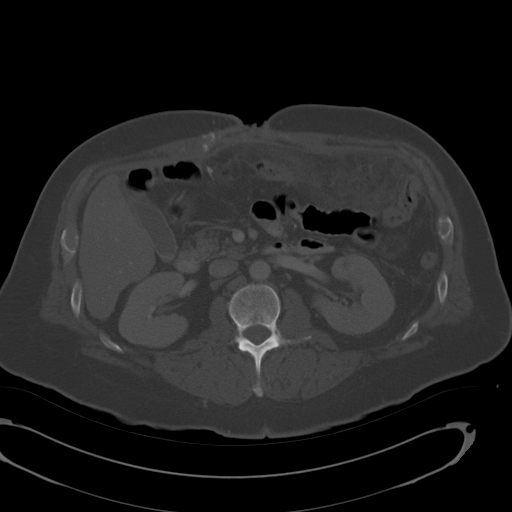
[im 66/91  soft-tissue]
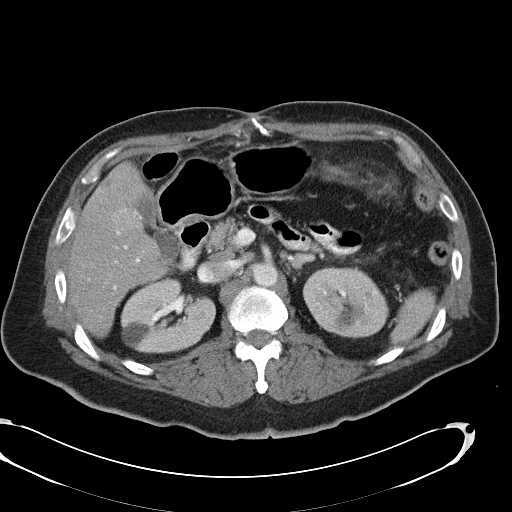
[im 71/91  soft-tissue]
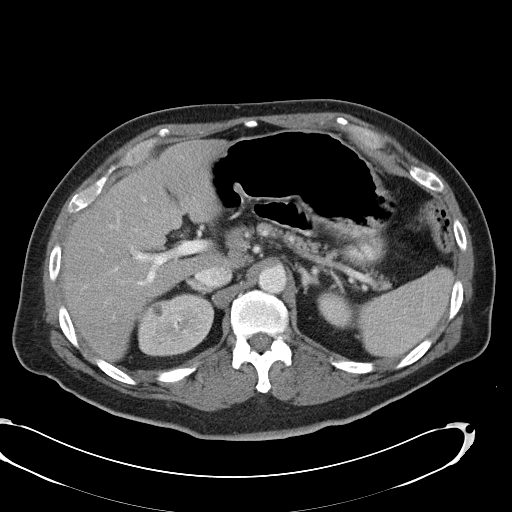
[im 81/91  soft-tissue]
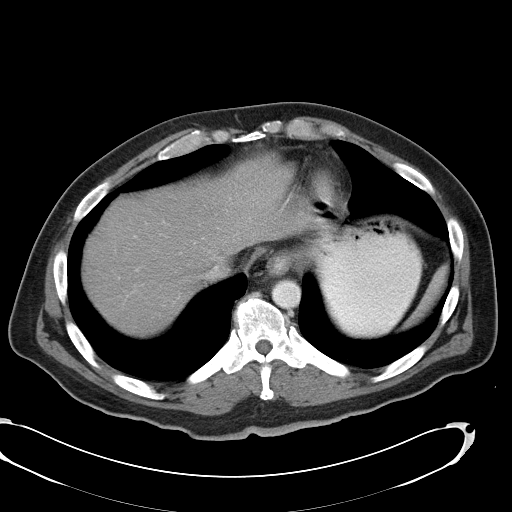
[im 86/91  soft-tissue]
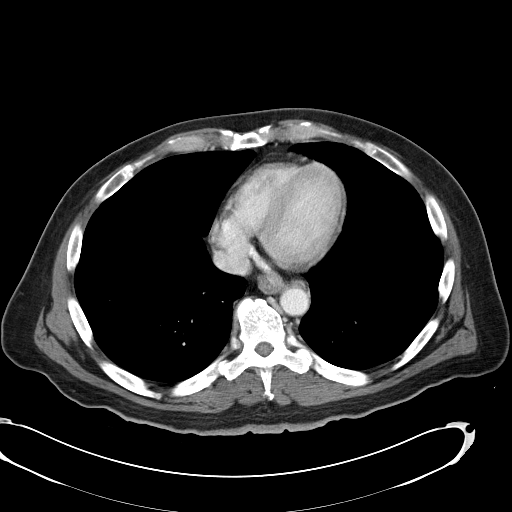

[Series 602: coronal · coronal · 0.89mm/px · 3 of 85 slices shown]
[im 29/85  soft-tissue]
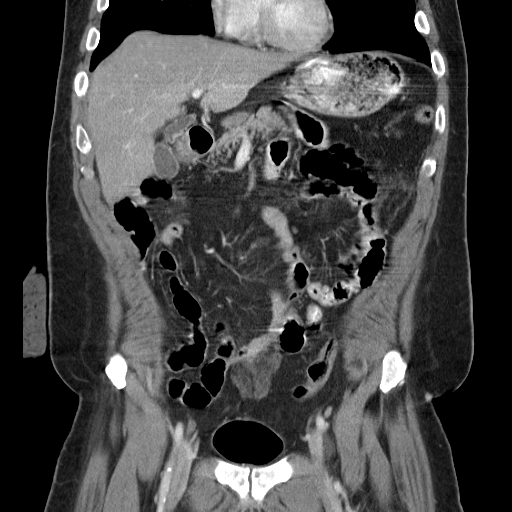
[im 38/85  soft-tissue]
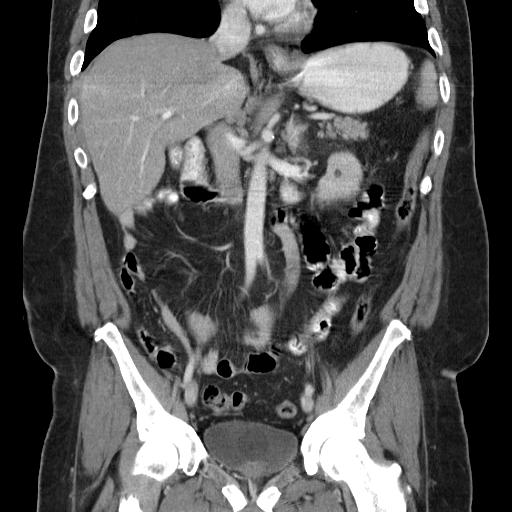
[im 47/85  soft-tissue]
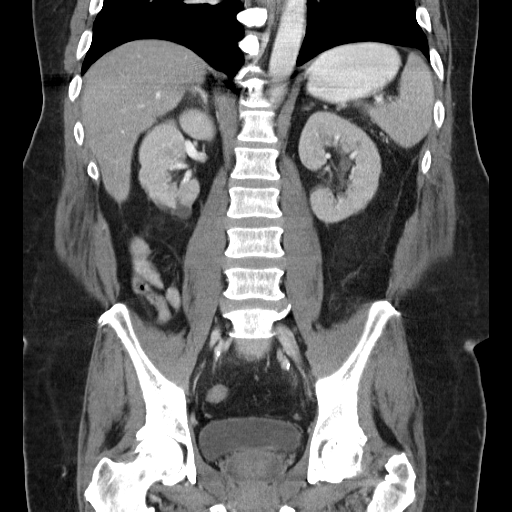

[16 of 46 positions shown; findings below may reference images not displayed]

FINDINGS: The lung bases are clear.  There is no pleural or
pericardial effusion.

The patient has a midline surgical wound.  A right lower quadrant
ileostomy is identified.  The patient is status post right
hemicolectomy.  There is no abscess.  There is extensive
infiltration of omental fat, particularly on the left.  There is no
rim enhancing fluid collection although there are some small foci
of fluid density material present.  No evidence of small bowel
obstruction is present.  The stomach appears normal.

A few small stones are seen layering dependently in the gallbladder
but there is no evidence of cholecystitis.  The liver, adrenal
glands, spleen and pancreas appear normal.  Low attenuating lesions
in both kidneys are most consistent with cysts. There are lymph
nodes in the porta hepatis measuring up to 1.8 cm on image 21.
Foley catheter is in place urinary bladder with an air-fluid level
identified.  No focal bony abnormality is seen with extensive
degenerative disease present about the hips.
IMPRESSION: 1.  Status post right hemicolectomy with a right lower quadrant
ileostomy and long Hartmann's pouch.
2.  Extensive infiltration of omental fat, worse on the left, could
be due to infectious or inflammatory process or postoperative
change.  Omental tumor implants are possible but felt somewhat less
likely.  No abscess is identified.
3.  Fatty infiltration of the liver.
4.  Small lymph node in the porta hepatis is nonspecific and likely
reactive.  Recommend attention on follow-up exams.
5.  Small gallstones without evidence of cholecystitis.

## 2012-03-29 MED ORDER — ALBUTEROL SULFATE (5 MG/ML) 0.5% IN NEBU
5.0000 mg | INHALATION_SOLUTION | Freq: Once | RESPIRATORY_TRACT | Status: AC
  Administered 2012-03-30: 5 mg via RESPIRATORY_TRACT
  Filled 2012-03-29: qty 1

## 2012-03-29 MED ORDER — SODIUM CHLORIDE 0.9 % IV SOLN
1.0000 g | Freq: Once | INTRAVENOUS | Status: AC
  Administered 2012-03-30: 1 g via INTRAVENOUS
  Filled 2012-03-29: qty 10

## 2012-03-29 MED ORDER — INSULIN ASPART 100 UNIT/ML IV SOLN
10.0000 [IU] | Freq: Once | INTRAVENOUS | Status: AC
  Administered 2012-03-29: 10 [IU] via INTRAVENOUS
  Filled 2012-03-29: qty 0.1

## 2012-03-29 MED ORDER — SODIUM CHLORIDE 0.9 % IV SOLN
1000.0000 mL | Freq: Once | INTRAVENOUS | Status: AC
  Administered 2012-03-29: 1000 mL via INTRAVENOUS

## 2012-03-29 MED ORDER — IOHEXOL 300 MG/ML  SOLN
100.0000 mL | Freq: Once | INTRAMUSCULAR | Status: AC | PRN
  Administered 2012-03-29: 100 mL via INTRAVENOUS

## 2012-03-29 MED ORDER — INSULIN ASPART 100 UNIT/ML ~~LOC~~ SOLN
SUBCUTANEOUS | Status: AC
  Filled 2012-03-29: qty 1

## 2012-03-29 MED ORDER — SODIUM CHLORIDE 0.9 % IV SOLN
1000.0000 mL | INTRAVENOUS | Status: DC
  Administered 2012-03-30: 1000 mL via INTRAVENOUS

## 2012-03-29 MED ORDER — DEXTROSE 50 % IV SOLN
1.0000 | Freq: Once | INTRAVENOUS | Status: AC
  Administered 2012-03-29: 50 mL via INTRAVENOUS
  Filled 2012-03-29: qty 50

## 2012-03-29 MED ORDER — SODIUM CHLORIDE 0.9 % IV BOLUS (SEPSIS): 1000.0000 mL | Freq: Once | INTRAVENOUS | Status: DC

## 2012-03-29 MED ORDER — ONDANSETRON HCL 4 MG/2ML IJ SOLN
4.0000 mg | Freq: Once | INTRAMUSCULAR | Status: AC
  Administered 2012-03-29: 4 mg via INTRAVENOUS
  Filled 2012-03-29: qty 2

## 2012-03-29 MED ORDER — SODIUM BICARBONATE 8.4 % IV SOLN
50.0000 meq | Freq: Once | INTRAVENOUS | Status: AC
  Administered 2012-03-29: 50 meq via INTRAVENOUS
  Filled 2012-03-29: qty 50

## 2012-03-29 MED ORDER — HYDROMORPHONE HCL PF 1 MG/ML IJ SOLN
1.0000 mg | Freq: Once | INTRAMUSCULAR | Status: AC
  Administered 2012-03-29: 1 mg via INTRAVENOUS
  Filled 2012-03-29: qty 1

## 2012-03-29 MED ORDER — SODIUM POLYSTYRENE SULFONATE 15 GM/60ML PO SUSP
45.0000 g | Freq: Once | ORAL | Status: AC
  Administered 2012-03-30: 45 g via ORAL
  Filled 2012-03-29: qty 180

## 2012-03-29 NOTE — ED Provider Notes (Signed)
History    CSN: [id number] Arrival date & time 03/29/12  1814 First MD Initiated Contact with Patient 03/29/12 1849    Chief Complaint  Patient presents with  . Nausea  . Emesis    HPI Comments: Pt has had decrease urine output associated with nausea and vomiting.  He has a complex recent history with hemicolectomy and a pneumoperitoneum last month.  He has had poor appetite and started vomiting several days ago.  Pt states he has not urinated at all and thinks that his urine must be going through his colostomy bag.  Patient is a 61 y.o. male presenting with vomiting. The history is provided by the patient.  Emesis  This is a new problem. The current episode started more than 2 days ago (3-4 days ago). The problem has been gradually worsening. There has been no fever. Associated symptoms include abdominal pain.  The abdominal pain is diffuse.  He has noticed output from the colostomy bag.  He has not noticed any blood.   He has vomited approx 3-4 times daily.  It is foul smelling like stool.  Past Medical History  Diagnosis Date  . Hyperlipidemia   . Hypertension   . Heart murmur   . Diabetes mellitus     type 2 IDDM x 15 years  . Sleep apnea     does not wear CPAP now  . Colon polyp   . Arthritis     left knee  . 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 12/25/2011    Past Surgical History  Procedure Date  . Colonoscopy   . Ganglion cyst excision 20 YRS AGO    RT ARM  . Partial colectomy 02/06/2012  . Partial colectomy 02/06/2012    Procedure: PARTIAL COLECTOMY;  Surgeon: Imogene Burn. [name], MD;  Location: [facility];  Service: General;  Laterality: N/A;  right partial colectomy  . Colonoscopy 02/06/2012    Procedure: COLONOSCOPY;  Surgeon: Milus Banister, MD;  Location: [place];  Service: Endoscopy;;  . Laparotomy 02/16/2012    Procedure: EXPLORATORY LAPAROTOMY;  Surgeon: Zenovia Jarred, MD;  Location: [place];  Service: General;  Laterality: N/A;  Exploratory Laparotomy with  resection of anastomosis  . Ileostomy 02/16/2012    Procedure: ILEOSTOMY;  Surgeon: Zenovia Jarred, MD;  Location: [place];  Service: General;  Laterality: N/A;  . Application of wound vac 02/16/2012    Procedure: APPLICATION OF WOUND VAC;  Surgeon: Zenovia Jarred, MD;  Location: [place];  Service: General;  Laterality: N/A;  . Laparotomy 02/18/2012    Procedure: EXPLORATORY LAPAROTOMY;  Surgeon: Odis Hollingshead, MD;  Location: [place];  Service: General;  Laterality: N/A;  exploratory laparotomy  change of abdominal vac dressing  . Dressing change under anesthesia 02/18/2012    Procedure: DRESSING CHANGE UNDER ANESTHESIA;  Surgeon: Odis Hollingshead, MD;  Location: [place];  Service: General;  Laterality: N/A;  . Laparotomy 02/20/2012    Procedure: EXPLORATORY LAPAROTOMY;  Surgeon: Odis Hollingshead, MD;  Location: [place];  Service: General;  Laterality: N/A;  . Vacuum assisted closure change 02/20/2012    Procedure: ABDOMINAL VACUUM ASSISTED CLOSURE CHANGE;  Surgeon: Odis Hollingshead, MD;  Location: [place];  Service: General;;  . Laparotomy 02/23/2012    Procedure: EXPLORATORY LAPAROTOMY;  Surgeon: Joyice Faster. Cornett, MD;  Location: [place];  Service: General;  Laterality: N/A;  Irrigation and Debridement of abdominal wound with wound vac change with partial closure  . Incision and  drainage of wound 02/23/2012    Procedure: IRRIGATION AND DEBRIDEMENT WOUND;  Surgeon: Joyice Faster. Cornett, MD;  Location: [place];  Service: General;  Laterality: N/A;  . Vacuum assisted closure change 02/23/2012    Procedure: ABDOMINAL VACUUM ASSISTED CLOSURE CHANGE;  Surgeon: Joyice Faster. Cornett, MD;  Location: [place];  Service: General;  Laterality: N/A;  . Laparotomy 02/26/2012    Procedure: EXPLORATORY LAPAROTOMY;  Surgeon: Imogene Burn. [name], MD;  Location: [place];  Service: General;  Laterality: N/A;     . Application of wound vac 02/26/2012    Procedure: APPLICATION OF WOUND VAC;  Surgeon: Imogene Burn. [name], MD;  Location: [place] [facility];   Service: General;  Laterality: N/A;    Family History  Problem Relation Age of Onset  . Diabetes Father   . Heart disease Father   . Colon cancer Neg Hx   . Diabetes Sister   . Heart disease Sister   . Heart disease Mother     History  Substance Use Topics  . Smoking status: Current Every Day Smoker -- 0.5 packs/day for 30 years    Types: Cigarettes  . Smokeless tobacco: Never Used   Comment: faxing referral to quitline when signed by patient  . Alcohol Use: Yes     LIQUOR OR WINE WEEKLY      Review of Systems  Gastrointestinal: Positive for vomiting and abdominal pain.  All other systems reviewed and are negative.    Allergies  Review of patient's allergies indicates no known allergies.  Home Medications   Current Outpatient Rx  Name Route Sig Dispense Refill  . ATORVASTATIN CALCIUM 20 MG PO TABS Oral Take 20 mg by mouth at bedtime.    Marland Kitchen CARVEDILOL 6.25 MG PO TABS Oral Take 1 tablet (6.25 mg total) by mouth 2 (two) times daily with a meal. 60 tablet 0  . INSULIN ISOPHANE & REGULAR (70-30) 100 UNIT/ML [place] SUSP Subcutaneous Inject 10-30 Units into the skin 2 (two) times daily with a meal. Inject 30 units in the morning and 10 units at bedtime. 10 mL 1  . LOPERAMIDE HCL 2 MG PO CAPS Oral Take 1 capsule (2 mg total) by mouth 4 (four) times daily as needed for diarrhea or loose stools (for very loose ileostomy output; output should be thicker (pancake batter consistency) not watery). 30 capsule 3  . METFORMIN HCL 500 MG PO TABS Oral Take 1,000 mg by mouth 2 (two) times daily with a meal.      BP 107/62  Pulse 69  Temp 97.6 F (36.4 C) (Oral)  Resp 18  SpO2 100%  Physical Exam  Nursing note and vitals reviewed. Constitutional: He appears well-developed and well-nourished. No distress.  HENT:  Head: Normocephalic and atraumatic.  Right Ear: External ear normal.  Left Ear: External ear normal.       MM Dry   Eyes: Conjunctivae normal are normal. Right eye exhibits  no discharge. Left eye exhibits no discharge. No scleral icterus.  Neck: Neck supple. No tracheal deviation present.  Cardiovascular: Normal rate, regular rhythm and intact distal pulses.   Pulmonary/Chest: Effort normal and breath sounds normal. No stridor. No respiratory distress. He has no wheezes. He has no rales.  Abdominal: Soft. He exhibits no distension, no ascites and no mass. Bowel sounds are decreased. There is generalized tenderness (mild). There is no rigidity, no rebound and no guarding.       Open abdominal wound luq, foul smelling drainage at base, no erythema, colostomy  rlq with brown liquid stool  Musculoskeletal: He exhibits no edema and no tenderness.  Neurological: He is alert. He has normal strength. No sensory deficit. Cranial nerve deficit:  no gross defecits noted. He exhibits normal muscle tone. He displays no seizure activity. Coordination normal.  Skin: Skin is warm and dry. No rash noted.  Psychiatric: He has a normal mood and affect.    ED Course  Procedures (including critical care time) CRITICAL CARE Performed by: Dorie Rank R Total critical care time: 30 Critical care time was exclusive of separately billable procedures and treating other patients. Critical care was necessary to treat or prevent imminent or life-threatening deterioration. Critical care was time spent personally by me on the following activities: development of treatment plan with patient and/or surrogate as well as nursing, discussions with consultants, evaluation of patient's response to treatment, examination of patient, obtaining history from patient or surrogate, ordering and performing treatments and interventions, ordering and review of laboratory studies, ordering and review of radiographic studies, pulse oximetry and re-evaluation of patient's condition.   Rate: 77  Rhythm: normal sinus rhythm  QRS Axis: normal  Intervals: normal  ST/T Wave abnormalities:  Peaked t waves  Conduction  Disutrbances:none  Narrative Interpretation: c/w hyperkalemia  Old EKG Reviewed: changes noted  Labs Reviewed  COMPREHENSIVE METABOLIC PANEL - Abnormal; Notable for the following:    Sodium 120 (*)     Potassium 6.5 (*)     Chloride 74 (*)     CO2 16 (*)     Glucose, Bld 158 (*)     BUN 156 (*)  REPEATED TO VERIFY   Creatinine, Ser 17.38 (*)  REPEATED TO VERIFY   Total Protein 9.7 (*)     Albumin 3.4 (*)     Alkaline Phosphatase 130 (*)     GFR calc non Af Amer 3 (*)     GFR calc Af Amer 3 (*)     All other components within normal limits  LIPASE, BLOOD - Abnormal; Notable for the following:    Lipase 143 (*)     All other components within normal limits  CBC WITH DIFFERENTIAL - Abnormal; Notable for the following:    WBC 14.7 (*)     HCT 36.7 (*)     MCV 68.3 (*)     MCH 25.1 (*)     MCHC 36.8 (*)     RDW 17.8 (*)     Neutro Abs 10.1 (*)     All other components within normal limits  URINALYSIS, MICROSCOPIC ONLY - Abnormal; Notable for the following:    Color, Urine AMBER (*)  BIOCHEMICALS MAY BE AFFECTED BY COLOR   APPearance CLOUDY (*)     Hgb urine dipstick TRACE (*)     Bilirubin Urine SMALL (*)     Ketones, ur TRACE (*)     Protein, ur 30 (*)     All other components within normal limits   Ct Abdomen Pelvis W Contrast  03/29/2012  *RADIOLOGY REPORT*  Clinical Data: Status post fall.  History of intra-abdominal abscess secondary to the anastomotic leak.  History of colon cancer.  CT ABDOMEN AND PELVIS WITH CONTRAST  Technique:  Multidetector CT imaging of the abdomen and pelvis was performed following the standard protocol during bolus administration of intravenous contrast.  Contrast: 163mL OMNIPAQUE IOHEXOL 300 MG/ML  SOLN  Comparison: Chest and two views abdomen 02/16/2012.  Findings: The lung bases are clear.  There is no pleural or pericardial effusion.  The patient has a midline surgical wound.  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  The patient is  status post right hemicolectomy.  There is no abscess.  There is extensive infiltration of omental fat, particularly on the left.  There is no rim enhancing fluid collection although there are some small foci of fluid density material present.  No evidence of small bowel obstruction is present.  The stomach appears normal.  A few small stones are seen layering dependently in the gallbladder but there is no evidence of cholecystitis.  The liver, adrenal glands, spleen and pancreas appear normal.  Low attenuating lesions in both kidneys are most consistent with cysts. There are lymph nodes in the porta hepatis measuring up to 1.8 cm on image 21. Foley catheter is in place urinary bladder with an air-fluid level identified.  No focal bony abnormality is seen with extensive degenerative disease present about the hips.  IMPRESSION:  1.  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 2.  Extensive infiltration of omental fat, worse on the left, could be due to infectious or inflammatory process or postoperative change.  Omental tumor implants are possible but felt somewhat less likely.  No abscess is identified. 3.  Fatty infiltration of the liver. 4.  Small lymph node in the porta hepatis is nonspecific and likely reactive.  Recommend attention on follow-up exams. 5.  Small gallstones without evidence of cholecystitis.   Original Report Authenticated By: Arvid Right. D'ALESSIO, M.D.     MDM  Patient has new onset acute renal failure.  This is likely related to his recent complex medical course.  Pt did notice that he has not urinated in a few days.  Will plan on admission and further workup with renal consultation.  The patient was treated with insulin, D50 and sodium bicarbonate for his metabolic acidosis and hyperkalemia. Peaked t waves noted on EKG. IV calcium ordered. He has received IV fluids in the emergency department for possible prerenal azotemia. The patient  does feel better after IV fluids, pain medications and antiemetics. Findings have been discussed with the patient and his family        Kathalene Frames, MD 03/29/12 [phone]

## 2012-03-29 NOTE — ED Notes (Signed)
RN to obtain labs with start of IV 

## 2012-03-29 NOTE — Consult Note (Addendum)
Patient's PCP: [name], MD  Consulting physician: Dr. Tomi Bamberger (ED)  Chief Complaint: Not making any urine  History of Present Illness: [name] is a 61 y.o. African American male with history of hypertension, hyperlipidemia, diabetes, obstructive sleep apnea, status post right partial colectomy on 02/06/2012 for invasive adenocarcinoma of the right colon.  Patient was readmitted for sepsis on 02/16/2012 then had a large amount of free intraperitoneal air, patient had expiratory laparotomy and had significant amount of intra-abdominal abscess.  He was also noted to have a small leak at the staple line of the common enterotomy of his anastomosis between the right colon and terminal ileum.  The patient had the anastomosis resected and end ileostomy was created.  He was subsequently discharged with SNF after a complicated hospital course requiring wound VAC.  The patient's hospital course was also complicated by hyponatremia which required fluid restriction.  Subsequently after discharge to the skilled nursing facility the patient reports having nausea and vomiting, has been vomiting at least 3 or 4 times a day.  Over the last week he has noted decreased urine output.  Patient believed that his urine was going into the colostomy bag.  In the emergency department patient was found to be in acute renal failure with hyperkalemia.  The hospitalist service was consulted for further management.  Patient denies any recent fevers or chills, denies any chest pain or shortness of breath.  Does complain of intermittent abdominal pain but believes it's from the surgical pain that he has had and not any new pain. Denies any headaches or vision changes.  Past Medical History  Diagnosis Date  . Hyperlipidemia   . Hypertension   . Heart murmur   . Diabetes mellitus     type 2 IDDM x 15 years  . Sleep apnea     does not wear CPAP now  . Colon polyp   . Arthritis     left knee  . Dysplastic polyp of  colon - proximal transverse 12/25/2011   Past Surgical History  Procedure Date  . Colonoscopy   . Ganglion cyst excision 20 YRS AGO    RT ARM  . Partial colectomy 02/06/2012  . Partial colectomy 02/06/2012    Procedure: PARTIAL COLECTOMY;  Surgeon: Imogene Burn. [name], MD;  Location: [place];  Service: General;  Laterality: N/A;  right partial colectomy  . Colonoscopy 02/06/2012    Procedure: COLONOSCOPY;  Surgeon: Milus Banister, MD;  Location: [place];  Service: Endoscopy;;  . Laparotomy 02/16/2012    Procedure: EXPLORATORY LAPAROTOMY;  Surgeon: Zenovia Jarred, MD;  Location: [place];  Service: General;  Laterality: N/A;  Exploratory Laparotomy with resection of anastomosis  . Ileostomy 02/16/2012    Procedure: ILEOSTOMY;  Surgeon: Zenovia Jarred, MD;  Location: [place];  Service: General;  Laterality: N/A;  . Application of wound vac 02/16/2012    Procedure: APPLICATION OF WOUND VAC;  Surgeon: Zenovia Jarred, MD;  Location: [place];  Service: General;  Laterality: N/A;  . Laparotomy 02/18/2012    Procedure: EXPLORATORY LAPAROTOMY;  Surgeon: Odis Hollingshead, MD;  Location: [place];  Service: General;  Laterality: N/A;  exploratory laparotomy  change of abdominal vac dressing  . Dressing change under anesthesia 02/18/2012    Procedure: DRESSING CHANGE UNDER ANESTHESIA;  Surgeon: Odis Hollingshead, MD;  Location: [place];  Service: General;  Laterality: N/A;  . Laparotomy 02/20/2012    Procedure: EXPLORATORY LAPAROTOMY;  Surgeon: Odis Hollingshead, MD;  Location[facility];  Service: General;  Laterality: N/A;  . Vacuum assisted closure change 02/20/2012    Procedure: ABDOMINAL VACUUM ASSISTED CLOSURE CHANGE;  Surgeon: Odis Hollingshead, MD;  Location: [place];  Service: General;;  . Laparotomy 02/23/2012    Procedure: EXPLORATORY LAPAROTOMY;  Surgeon: Joyice Faster. Cornett, MD;  Location: [place];  Service: General;  Laterality: N/A;  Irrigation and Debridement of abdominal wound with wound vac change with partial closure   . Incision and drainage of wound 02/23/2012    Procedure: IRRIGATION AND DEBRIDEMENT WOUND;  Surgeon: Joyice Faster. Cornett, MD;  Location: [place];  Service: General;  Laterality: N/A;  . Vacuum assisted closure change 02/23/2012    Procedure: ABDOMINAL VACUUM ASSISTED CLOSURE CHANGE;  Surgeon: Joyice Faster. Cornett, MD;  Location: [place];  Service: General;  Laterality: N/A;  . Laparotomy 02/26/2012    Procedure: EXPLORATORY LAPAROTOMY;  Surgeon: Imogene Burn. [name], MD;  Location: [place];  Service: General;  Laterality: N/A;     . Application of wound vac 02/26/2012    Procedure: APPLICATION OF WOUND VAC;  Surgeon: Imogene Burn. [name], MD;  Location: [place] [facility];  Service: General;  Laterality: N/A;   Family History  Problem Relation Age of Onset  . Diabetes Father   . Heart disease Father   . Colon cancer Neg Hx   . Diabetes Sister   . Heart disease Sister   . Heart disease Mother    History   Social History  . Marital Status: Divorced    Spouse Name: N/A    Number of Children: N/A  . Years of Education: N/A   Occupational History  . Not on file.   Social History Main Topics  . Smoking status: Current Every Day Smoker -- 0.5 packs/day for 30 years    Types: Cigarettes  . Smokeless tobacco: Never Used   Comment: faxing referral to quitline when signed by patient  . Alcohol Use: Yes     LIQUOR OR WINE WEEKLY  . Drug Use: No  . Sexually Active: Not Currently   Other Topics Concern  . Not on file   Social History Narrative   Financial assistance approved for 100% discount at [facility] and has [place]  August 08, 2009 5:48 PM*PATIENT WAS GIVEN DM CARD.Lela Sturdivant NT II  June 08, 2010 3:56 PM   Allergies: Review of patient's allergies indicates no known allergies.  Home Meds: Prior to Admission medications   Medication Sig Start Date End Date Taking? Authorizing Provider  atorvastatin (LIPITOR) 20 MG tablet Take 20 mg by mouth at bedtime.   Yes Historical Provider, MD    carvedilol (COREG) 6.25 MG tablet Take 1 tablet (6.25 mg total) by mouth 2 (two) times daily with a meal. 02/13/12 02/12/13 Yes Matthew K. Tsuei, MD  insulin NPH-insulin regular (NOVOLIN 70/30) (70-30) 100 UNIT/ML injection Inject 10-30 Units into the skin 2 (two) times daily with a meal. Inject 30 units in the morning and 10 units at bedtime. 03/12/12  Yes [name], MD  loperamide (IMODIUM) 2 MG capsule Take 1 capsule (2 mg total) by mouth 4 (four) times daily as needed for diarrhea or loose stools (for very loose ileostomy output; output should be thicker (pancake batter consistency) not watery). 03/10/12  Yes Imogene Burn. Tsuei, MD  metFORMIN (GLUCOPHAGE) 500 MG tablet Take 1,000 mg by mouth 2 (two) times daily with a meal.   Yes Historical Provider, MD    Review of Systems: All  systems reviewed with the patient and positive as per history of present illness, otherwise all other systems are negative.  Physical Exam: Blood pressure 107/62, pulse 69, temperature 97.6 F (36.4 C), temperature source Oral, resp. rate 18, SpO2 100.00%. General: Awake, Oriented x3, No acute distress. HEENT: EOMI, dry mucous membranes Neck: Supple CV: S1 and S2 Lungs: Clear to ascultation bilaterally Abdomen: Soft, Nontender, Nondistended, +bowel sounds.  Colostomy bag in place on the right side.  Abdominal wound covered in bandages. Ext: Good pulses. Trace edema. No clubbing or cyanosis noted. Neuro: Cranial Nerves II-XII grossly intact. Has 5/5 motor strength in upper and lower extremities.  Lab results:  [facility] 03/29/12 2143  NA 120*  K 6.5*  CL 74*  CO2 16*  GLUCOSE 158*  BUN 156*  CREATININE 17.38*  CALCIUM 9.3  MG --  PHOS --    Basename 03/29/12 2143  AST 33  ALT 23  ALKPHOS 130*  BILITOT 1.0  PROT 9.7*  ALBUMIN 3.4*    Basename 03/29/12 2143  LIPASE 143*  AMYLASE --    Basename 03/29/12 2143  WBC 14.7*  NEUTROABS 10.1*  HGB 13.5  HCT 36.7*  MCV 68.3*  PLT 358   No  results found for this basename: CKTOTAL:3,CKMB:3,CKMBINDEX:3,TROPONINI:3 in the last 72 hours No components found with this basename: POCBNP:3 No results found for this basename: DDIMER in the last 72 hours No results found for this basename: HGBA1C:2 in the last 72 hours No results found for this basename: CHOL:2,HDL:2,LDLCALC:2,TRIG:2,CHOLHDL:2,LDLDIRECT:2 in the last 72 hours No results found for this basename: TSH,T4TOTAL,FREET3,T3FREE,THYROIDAB in the last 72 hours No results found for this basename: VITAMINB12:2,FOLATE:2,FERRITIN:2,TIBC:2,IRON:2,RETICCTPCT:2 in the last 72 hours Imaging results:  Ct Abdomen Pelvis W Contrast  03/29/2012  *RADIOLOGY REPORT*  Clinical Data: Status post fall.  History of intra-abdominal abscess secondary to the anastomotic leak.  History of colon cancer.  CT ABDOMEN AND PELVIS WITH CONTRAST  Technique:  Multidetector CT imaging of the abdomen and pelvis was performed following the standard protocol during bolus administration of intravenous contrast.  Contrast: 131mL OMNIPAQUE IOHEXOL 300 MG/ML  SOLN  Comparison: Chest and two views abdomen 02/16/2012.  Findings: The lung bases are clear.  There is no pleural or pericardial effusion.  The patient has a midline surgical wound.  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  The patient is status post right hemicolectomy.  There is no abscess.  There is extensive infiltration of omental fat, particularly on the left.  There is no rim enhancing fluid collection although there are some small foci of fluid density material present.  No evidence of small bowel obstruction is present.  The stomach appears normal.  A few small stones are seen layering dependently in the gallbladder but there is no evidence of cholecystitis.  The liver, adrenal glands, spleen and pancreas appear normal.  Low attenuating lesions in both kidneys are most consistent with cysts. There are lymph nodes in the porta hepatis measuring up to 1.8 cm on  image 21. Foley catheter is in place urinary bladder with an air-fluid level identified.  No focal bony abnormality is seen with extensive degenerative disease present about the hips.  IMPRESSION:  1.  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 2.  Extensive infiltration of omental fat, worse on the left, could be due to infectious or inflammatory process or postoperative change.  Omental tumor implants are possible but felt somewhat less likely.  No abscess is identified.  3.  Fatty infiltration of the liver. 4.  Small lymph node in the porta hepatis is nonspecific and likely reactive.  Recommend attention on follow-up exams. 5.  Small gallstones without evidence of cholecystitis.   Original Report Authenticated By: Arvid Right. Luther Parody, M.D.    Dg Chest Port 1 View  02/29/2012  *RADIOLOGY REPORT*  Clinical Data: Atelectasis.  Pleural effusion.  PORTABLE CHEST - 1 VIEW  Comparison: Chest 02/28/2012.  Findings: Endotracheal tube and NG tube have been removed.  Right PICC remains in place.  Right greater right pleural effusions and airspace disease appear improved.  No pneumothorax.  Heart size upper normal.  IMPRESSION:  1.  Status post extubation and NG tube removal. 2.  Improved right greater than left pleural effusions and basilar airspace disease.   Original Report Authenticated By: Arvid Right. Luther Parody, M.D.    Dg Chest Port 1v Same Day  03/02/2012  *RADIOLOGY REPORT*  Clinical Data: Cough and shortness of breath.  PORTABLE CHEST - 1 VIEW SAME DAY  Comparison: 02/29/2012.  Findings: Improved inspiration.  The cardiac silhouette remains borderline enlarged.  Improved aeration at the lung bases with minimal residual atelectasis at the medial right lung base.  No visible pleural fluid on this image.  Right PICC tip in the inferior aspect of the superior vena cava.  Thoracic spine degenerative changes.  IMPRESSION:  1.  Improved inspiration with resolved left  basilar atelectasis and almost completely resolved right basilar atelectasis. 2.  Right PICC tip in the inferior aspect of the superior vena cava.  If a position at the cavoatrial junction is desired, it would be recommended that this be advanced 2 cm.   Original Report Authenticated By: Gerald Stabs, M.D.    Other results: EKG: Peaked T-waves in leads V1-V6 and Leads I, II, III, and aVF.  Assessment & Plan by Problem: Acute renal failure Etiology unclear.  May be prerenal in etiology given patient has been having nausea vomiting after discharge.  Continue aggressive fluid resuscitation.  Send for urine sodium and creatinine to calculate FeNa (ordered).  Likely will need renal ultrasound to evaluate for any obstruction (not ordered).  Renal, Dr. Jonnie Finner, consulted. Unfortunately, patient received IV contrast for the CT scan prior to my evaluation of the patient.  I had a long discussion with the patient that he should not have received IV contrast for the CT scan and he probably had a CT scan done with IV contrast based on labs from last month which showed normal renal function and his labs done today may not have resulted by the time he had the CT scan done.  I also explained to the patient that as he received IV contrast this could have repercussions on his renal function and we will know the extent of the damage until few days later.  I also explained to the patient that there is a possibility that he may need long-term dialysis.  Patient expressed understanding.  Hyperkalemia with peaked T-waves Patient given Kayexalate, insulin with D. 50, and calcium gluconate.  Likely related to acute renal failure.  Continue to monitor.  Renal consulted, uncertain if patient would want emergent dialysis, will defer to renal service.  Hyponatremia Maybe due to acute renal failure and dehydration.  Continue aggressive fluid resuscitation.  Type 2 diabetes Hold metformin in the setting of acute renal failure.   SSI.  Metabolic acidosis Likely due to acute renal failure.  Uncertain if metformin use in the setting of acute renal failure  is contributing to metabolic acidosis.  Nausea and vomiting with abdominal pain Etiology of nausea and vomiting uncertain.  Patient had CT of the abdomen and pelvis with IV contrast chest postsurgical changes, extensive infiltration of omental fat, small gallstones without evidence of cholecystitis.  Likely will need general surgery to evaluate the patient after admission.  CODE STATUS Full code.  This was discussed with patient at the time of admission.  Disposition Patient to be transferred to [facility] (stepdown unit) given acute renal failure, hyperkalemia and the need for dialysis.  Patient is a Internal Medicine teaching service patient, discussed with Dr. Obie Dredge (attending Dr. Johnnye Sima). Internal Medicine teaching service will admit the patient once the patient arrives at [facility].  Time spent on Consult, talking to the patient, and coordinating care was: 90 mins.  Verbon Giangregorio A, MD 03/29/2012, 11:56 PM

## 2012-03-29 NOTE — ED Notes (Signed)
[id number] Expected date:<BR> Expected time:<BR> Means of arrival:<BR> Comments:<BR> ems

## 2012-03-30 ENCOUNTER — Encounter (HOSPITAL_COMMUNITY): Payer: Self-pay | Admitting: *Deleted

## 2012-03-30 ENCOUNTER — Inpatient Hospital Stay (HOSPITAL_COMMUNITY): Payer: Medicaid Other

## 2012-03-30 DIAGNOSIS — E119 Type 2 diabetes mellitus without complications: Secondary | ICD-10-CM

## 2012-03-30 DIAGNOSIS — I10 Essential (primary) hypertension: Secondary | ICD-10-CM

## 2012-03-30 DIAGNOSIS — N179 Acute kidney failure, unspecified: Principal | ICD-10-CM

## 2012-03-30 DIAGNOSIS — C189 Malignant neoplasm of colon, unspecified: Secondary | ICD-10-CM

## 2012-03-30 DIAGNOSIS — T8132XA Disruption of internal operation (surgical) wound, not elsewhere classified, initial encounter: Secondary | ICD-10-CM | POA: Diagnosis present

## 2012-03-30 DIAGNOSIS — E875 Hyperkalemia: Secondary | ICD-10-CM

## 2012-03-30 DIAGNOSIS — D72829 Elevated white blood cell count, unspecified: Secondary | ICD-10-CM | POA: Diagnosis present

## 2012-03-30 DIAGNOSIS — E871 Hypo-osmolality and hyponatremia: Secondary | ICD-10-CM | POA: Diagnosis present

## 2012-03-30 LAB — URINE MICROSCOPIC-ADD ON

## 2012-03-30 LAB — URINALYSIS, ROUTINE W REFLEX MICROSCOPIC
Glucose, UA: NEGATIVE mg/dL
Ketones, ur: NEGATIVE mg/dL
Nitrite: NEGATIVE
Protein, ur: 100 mg/dL — AB
Specific Gravity, Urine: 1.029 (ref 1.005–1.030)
Urobilinogen, UA: 0.2 mg/dL (ref 0.0–1.0)
pH: 5 (ref 5.0–8.0)

## 2012-03-30 LAB — BASIC METABOLIC PANEL
BUN: 151 mg/dL — ABNORMAL HIGH (ref 6–23)
CO2: 23 mEq/L (ref 19–32)
Calcium: 8.3 mg/dL — ABNORMAL LOW (ref 8.4–10.5)
Chloride: 83 mEq/L — ABNORMAL LOW (ref 96–112)
Creatinine, Ser: 17.02 mg/dL — ABNORMAL HIGH (ref 0.50–1.35)
GFR calc Af Amer: 3 mL/min — ABNORMAL LOW (ref >90)
GFR calc non Af Amer: 3 mL/min — ABNORMAL LOW (ref >90)
Glucose, Bld: 110 mg/dL — ABNORMAL HIGH (ref 70–99)
Potassium: 4.7 mEq/L (ref 3.5–5.1)
Sodium: 129 mEq/L — ABNORMAL LOW (ref 135–145)

## 2012-03-30 LAB — COMPREHENSIVE METABOLIC PANEL
ALT: 18 U/L (ref 0–53)
AST: 26 U/L (ref 0–37)
Albumin: 2.8 g/dL — ABNORMAL LOW (ref 3.5–5.2)
Alkaline Phosphatase: 104 U/L (ref 39–117)
BUN: 158 mg/dL — ABNORMAL HIGH (ref 6–23)
CO2: 18 mEq/L — ABNORMAL LOW (ref 19–32)
Calcium: 8.5 mg/dL (ref 8.4–10.5)
Chloride: 82 mEq/L — ABNORMAL LOW (ref 96–112)
Creatinine, Ser: 16.82 mg/dL — ABNORMAL HIGH (ref 0.50–1.35)
GFR calc Af Amer: 3 mL/min — ABNORMAL LOW (ref >90)
GFR calc non Af Amer: 3 mL/min — ABNORMAL LOW (ref >90)
Glucose, Bld: 86 mg/dL (ref 70–99)
Potassium: 4.5 mEq/L (ref 3.5–5.1)
Sodium: 127 mEq/L — ABNORMAL LOW (ref 135–145)
Total Bilirubin: 0.8 mg/dL (ref 0.3–1.2)
Total Protein: 8.1 g/dL (ref 6.0–8.3)

## 2012-03-30 LAB — GLUCOSE, CAPILLARY
Glucose-Capillary: 92 mg/dL (ref 70–99)
Glucose-Capillary: 94 mg/dL (ref 70–99)
Glucose-Capillary: 114 mg/dL — ABNORMAL HIGH (ref 70–99)
Glucose-Capillary: 130 mg/dL — ABNORMAL HIGH (ref 70–99)

## 2012-03-30 LAB — MRSA PCR SCREENING: MRSA by PCR: NEGATIVE

## 2012-03-30 LAB — IRON AND TIBC
Iron: 77 ug/dL (ref 42–135)
Saturation Ratios: 36 % (ref 20–55)
TIBC: 211 ug/dL — ABNORMAL LOW (ref 215–435)
UIBC: 134 ug/dL (ref 125–400)

## 2012-03-30 LAB — CREATININE, URINE, RANDOM: Creatinine, Urine: 514.5 mg/dL

## 2012-03-30 LAB — SODIUM, URINE, RANDOM: Sodium, Ur: 8 mEq/L

## 2012-03-30 IMAGING — US US RENAL
1 series · 14 of 25 positions shown · non-contrast
Comparison: CT scan dated [DATE]

CLINICAL DATA: Acute renal failure.

RENAL/URINARY TRACT ULTRASOUND COMPLETE

[Series 1: us renal · 0.32mm/px · 14 of 29 slices shown]
[im 1/29]
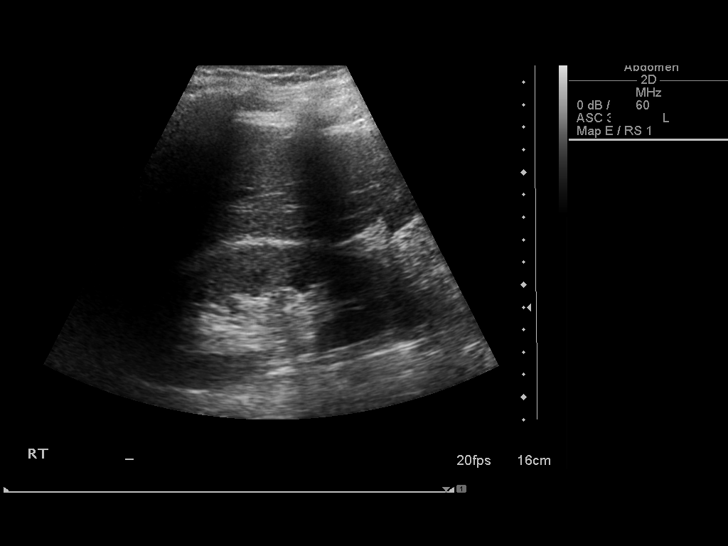
[im 3/29]
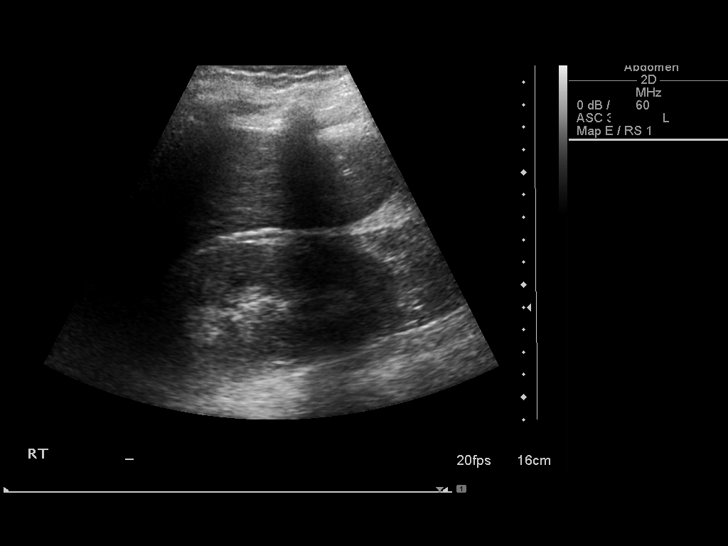
[im 5/29]
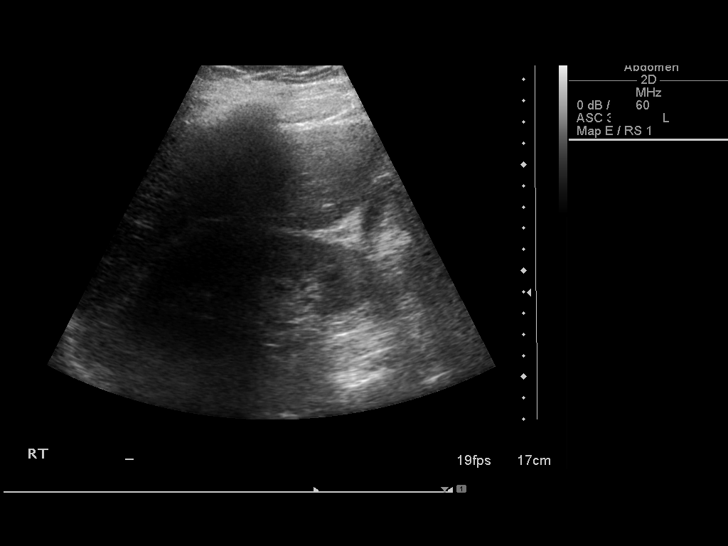
[im 8/29]
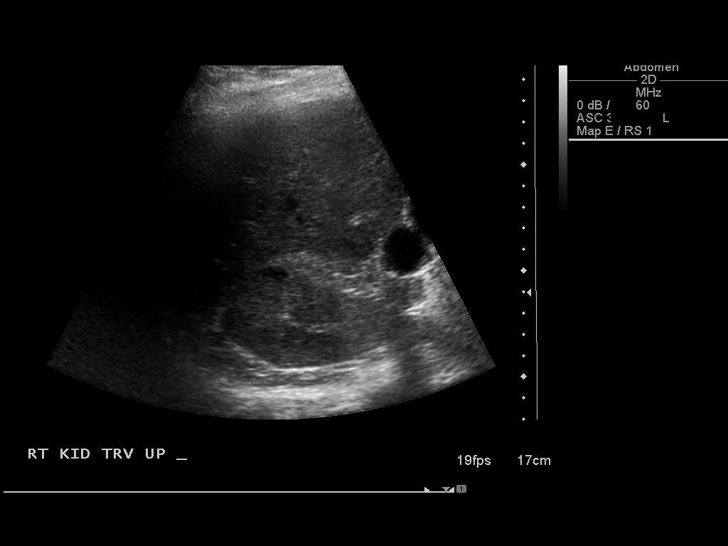
[im 10/29]
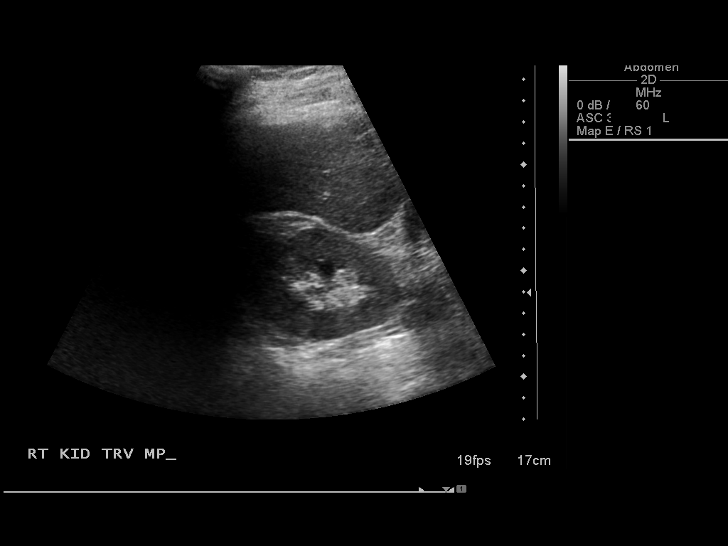
[im 11/29]
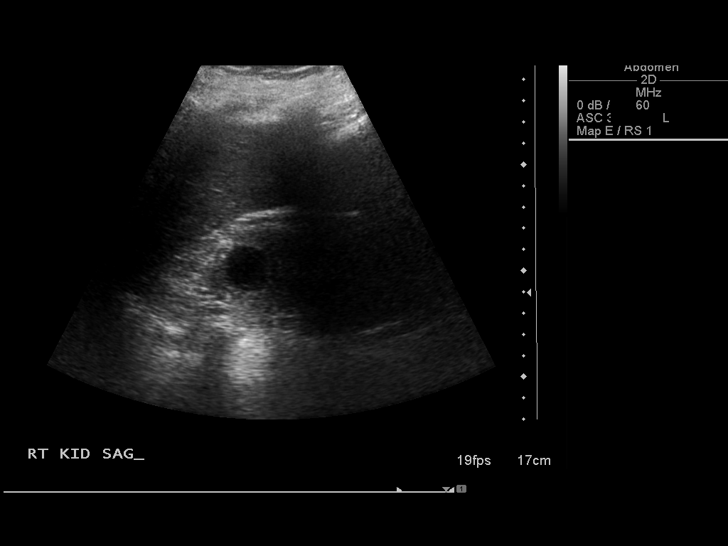
[im 13/29]
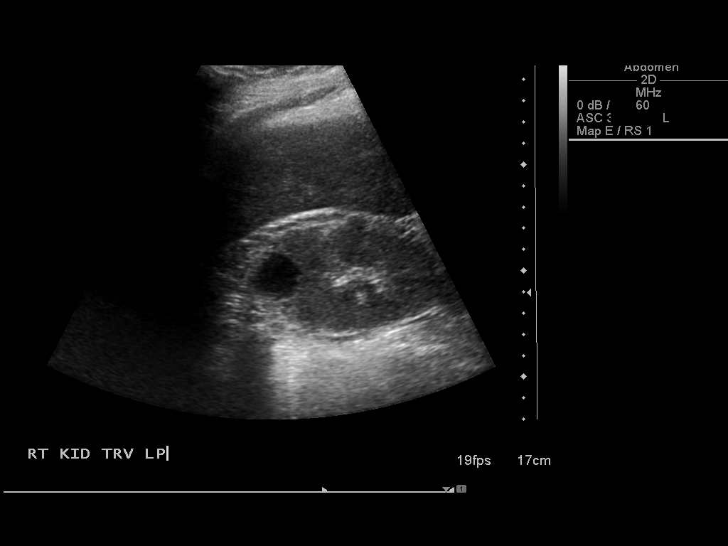
[im 16/29]
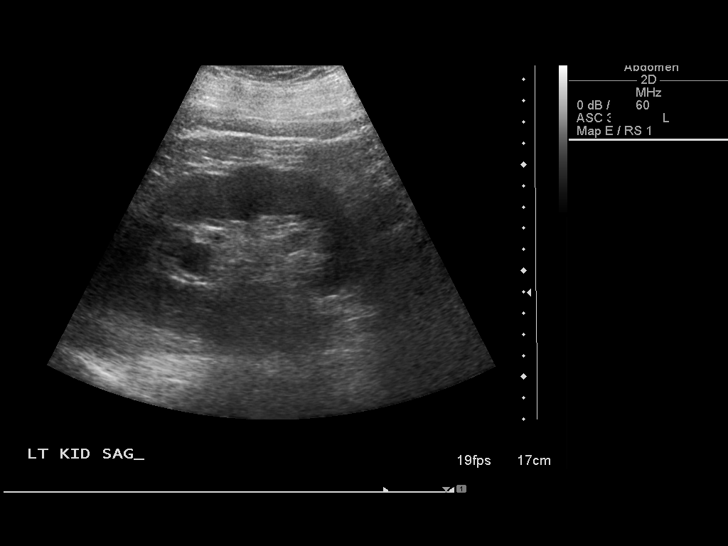
[im 18/29]
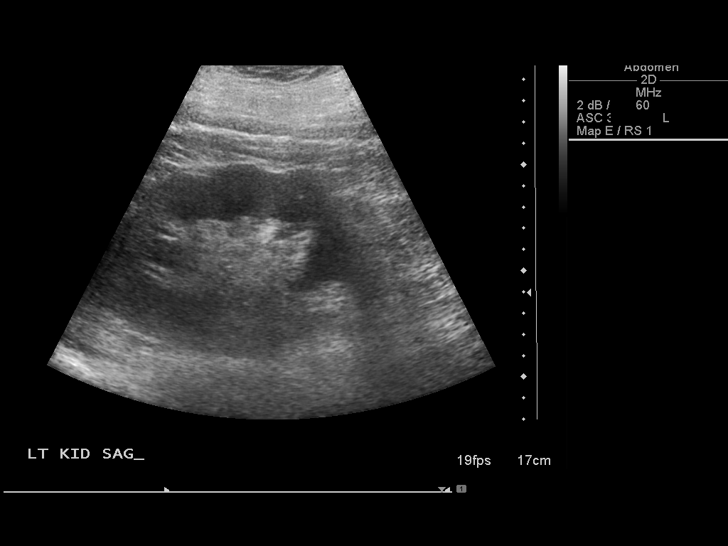
[im 19/29]
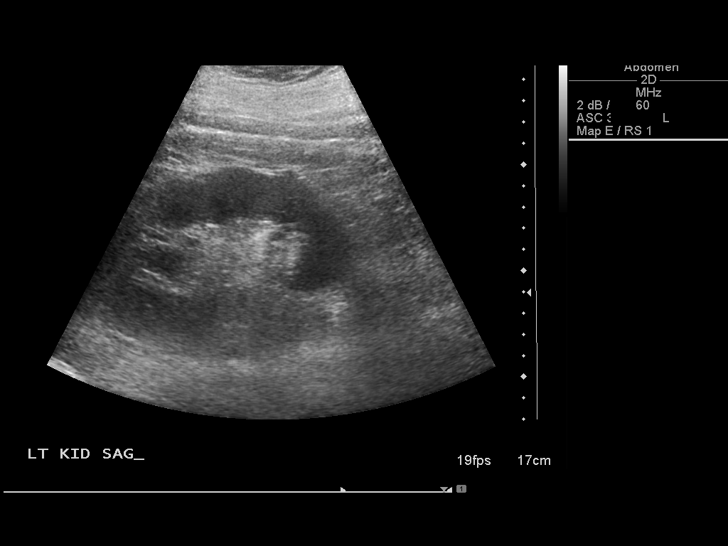
[im 22/29]
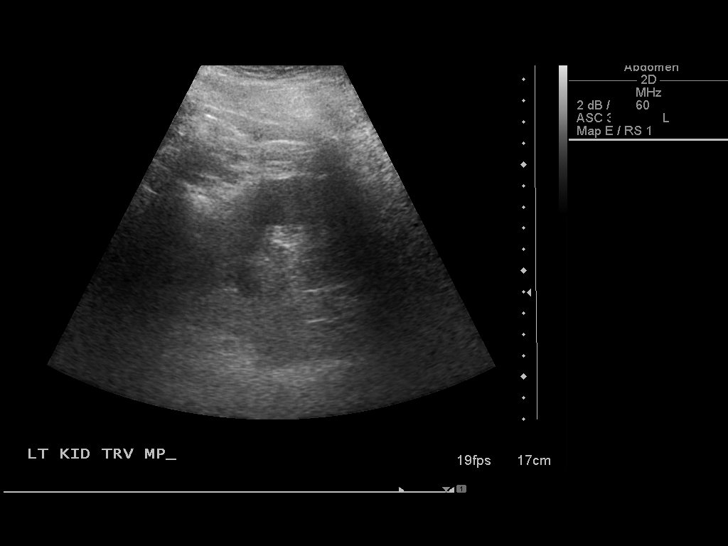
[im 24/29]
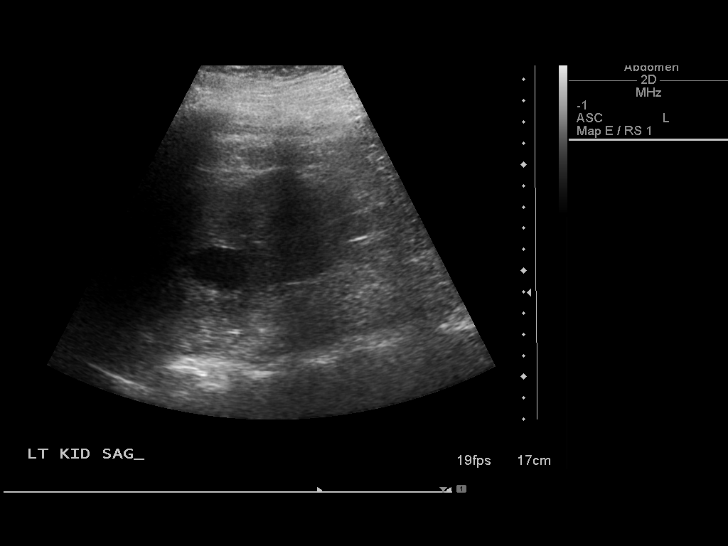
[im 26/29]
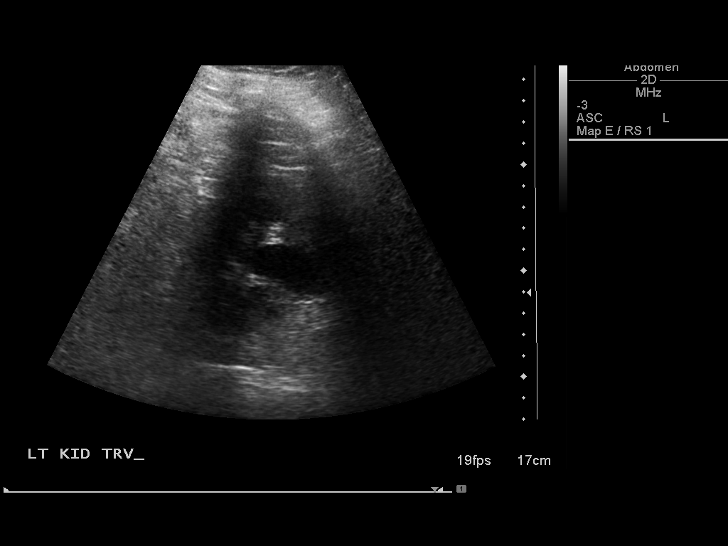
[im 29/29]
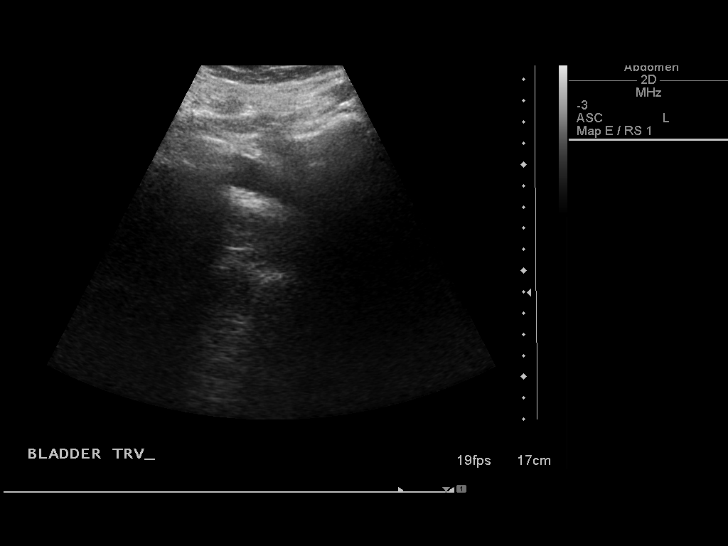

[14 of 25 positions shown; findings below may reference images not displayed]

FINDINGS: Right Kidney:  13.4 cm in length.  Several small cysts in the right
kidney.  No hydronephrosis. Slightly echogenic renal parenchyma,
equal to the liver.

Left Kidney:  12.6 cm in length.  3.3 cm oblong cyst in the upper
pole.  No hydronephrosis.

Bladder:  Almost empty.
IMPRESSION: 1.  No hydronephrosis.  Slight increased echogenicity of the renal
parenchyma consistent with renal medical disease.
2.  Small benign-appearing cysts on both kidneys.

## 2012-03-30 MED ORDER — STERILE WATER FOR INJECTION IV SOLN
INTRAVENOUS | Status: DC
  Administered 2012-03-30 – 2012-03-31 (×3): via INTRAVENOUS
  Filled 2012-03-30 (×11): qty 850

## 2012-03-30 MED ORDER — ALUM & MAG HYDROXIDE-SIMETH 200-200-20 MG/5ML PO SUSP
15.0000 mL | ORAL | Status: DC | PRN
  Administered 2012-03-30: 15 mL via ORAL
  Filled 2012-03-30: qty 30

## 2012-03-30 MED ORDER — STERILE WATER FOR INJECTION IV SOLN: INTRAVENOUS | Status: DC

## 2012-03-30 MED ORDER — STERILE WATER FOR INJECTION IV SOLN
INTRAVENOUS | Status: AC
  Administered 2012-03-30: 04:00:00 via INTRAVENOUS
  Filled 2012-03-30 (×3): qty 850

## 2012-03-30 MED ORDER — BIOTENE DRY MOUTH MT LIQD
15.0000 mL | Freq: Two times a day (BID) | OROMUCOSAL | Status: DC
  Administered 2012-03-30 – 2012-04-06 (×11): 15 mL via OROMUCOSAL

## 2012-03-30 MED ORDER — HEPARIN SODIUM (PORCINE) 5000 UNIT/ML IJ SOLN
5000.0000 [IU] | Freq: Three times a day (TID) | INTRAMUSCULAR | Status: DC
  Administered 2012-03-30 – 2012-04-06 (×22): 5000 [IU] via SUBCUTANEOUS
  Filled 2012-03-30 (×25): qty 1

## 2012-03-30 MED ORDER — INSULIN ASPART 100 UNIT/ML ~~LOC~~ SOLN: 0.0000 [IU] | SUBCUTANEOUS | Status: DC

## 2012-03-30 MED ORDER — MORPHINE SULFATE 2 MG/ML IJ SOLN
2.0000 mg | INTRAMUSCULAR | Status: DC | PRN
  Administered 2012-03-30 – 2012-03-31 (×3): 2 mg via INTRAVENOUS
  Filled 2012-03-30 (×4): qty 1

## 2012-03-30 MED ORDER — ONDANSETRON HCL 4 MG/2ML IJ SOLN
4.0000 mg | Freq: Four times a day (QID) | INTRAMUSCULAR | Status: DC | PRN
  Administered 2012-03-30 (×2): 4 mg via INTRAVENOUS
  Filled 2012-03-30 (×2): qty 2

## 2012-03-30 MED ORDER — BOOST / RESOURCE BREEZE PO LIQD
1.0000 | Freq: Three times a day (TID) | ORAL | Status: DC
  Administered 2012-03-30 – 2012-04-05 (×14): 1 via ORAL
  Filled 2012-03-30 (×2): qty 1

## 2012-03-30 MED ORDER — CARVEDILOL 6.25 MG PO TABS
6.2500 mg | ORAL_TABLET | Freq: Two times a day (BID) | ORAL | Status: DC
  Administered 2012-03-30: 6.25 mg via ORAL
  Filled 2012-03-30 (×3): qty 1

## 2012-03-30 MED ORDER — ONDANSETRON HCL 4 MG PO TABS: 4.0000 mg | ORAL_TABLET | Freq: Four times a day (QID) | ORAL | Status: DC | PRN

## 2012-03-30 MED ORDER — SODIUM CHLORIDE 0.9 % IV BOLUS (SEPSIS)
2000.0000 mL | Freq: Once | INTRAVENOUS | Status: AC
  Administered 2012-03-30: 2000 mL via INTRAVENOUS

## 2012-03-30 MED ORDER — SODIUM CHLORIDE 0.9 % IJ SOLN
3.0000 mL | Freq: Two times a day (BID) | INTRAMUSCULAR | Status: DC
  Administered 2012-03-30 – 2012-04-06 (×7): 3 mL via INTRAVENOUS

## 2012-03-30 MED ORDER — ATORVASTATIN CALCIUM 20 MG PO TABS
20.0000 mg | ORAL_TABLET | Freq: Every day | ORAL | Status: DC
  Administered 2012-03-30 – 2012-04-05 (×7): 20 mg via ORAL
  Filled 2012-03-30 (×8): qty 1

## 2012-03-30 MED ORDER — WHITE PETROLATUM GEL
Status: AC
  Administered 2012-03-30: 0.2
  Filled 2012-03-30: qty 5

## 2012-03-30 MED ORDER — INSULIN ASPART 100 UNIT/ML ~~LOC~~ SOLN
0.0000 [IU] | Freq: Three times a day (TID) | SUBCUTANEOUS | Status: DC
  Administered 2012-03-30 – 2012-03-31 (×2): 1 [IU] via SUBCUTANEOUS
  Administered 2012-03-31: 2 [IU] via SUBCUTANEOUS
  Administered 2012-04-01 – 2012-04-02 (×2): 1 [IU] via SUBCUTANEOUS
  Administered 2012-04-02: 2 [IU] via SUBCUTANEOUS
  Administered 2012-04-03: 1 [IU] via SUBCUTANEOUS
  Administered 2012-04-03 – 2012-04-05 (×5): 2 [IU] via SUBCUTANEOUS

## 2012-03-30 MED ORDER — INSULIN GLARGINE 100 UNIT/ML ~~LOC~~ SOLN
10.0000 [IU] | Freq: Every day | SUBCUTANEOUS | Status: DC
  Administered 2012-03-30 – 2012-04-05 (×7): 10 [IU] via SUBCUTANEOUS

## 2012-03-30 MED ORDER — ONDANSETRON HCL 4 MG/2ML IJ SOLN
INTRAMUSCULAR | Status: AC
  Administered 2012-03-30: 4 mg
  Filled 2012-03-30: qty 2

## 2012-03-30 NOTE — Progress Notes (Signed)
Patient ID: [name], male   DOB: February 09, 1951, 61 y.o.   MRN: [id number] Medical Student Daily Progress Note   Subjective:    Interval Events:   Pt had one episode of hypotension to 84/60 overnight but this has since resolved to 134/71 this morning.  No other acute events overnight. Today the patient complains of abdominal pain at his incision site which during the time of this interview was a 5/10.  He also complains of dry mouth and a bad taste, asking for something to drink.  Pt denies headache, shortness of breath, chest pain, weakness, numbness or tingling, an any other pain besides what has already been described.    Objective:    Vital Signs:   Temp:  [97.5 F (36.4 C)-97.7 F (36.5 C)] 97.6 F (36.4 C) (10/28 1152) Pulse Rate:  [69-97] 73  (10/28 1152) Resp:  [15-21] 16  (10/28 1152) BP: (84-134)/(59-83) 100/59 mmHg (10/28 1152) SpO2:  [98 %-100 %] 100 % (10/28 1152) Weight:  [89.4 kg (197 lb 1.5 oz)] 89.4 kg (197 lb 1.5 oz) (10/28 0200) Last BM Date: 03/30/12   Weights:  Filed Weights   03/30/12 0200  Weight: 89.4 kg (197 lb 1.5 oz)    Intake/Output:   Intake/Output Summary (Last 24 hours) at 03/30/12 1335 Last data filed at 03/30/12 1202  Gross per 24 hour  Intake 2795.33 ml  Output   2490 ml  Net 305.33 ml     Physical Exam: GENERAL:  alert and oriented, able to follow commands and was cooperative with the exam, resting comfortably in bed and in no distress EYES:  pupils equal, round, and reactive to light; sclera anicteric ENT:  Dry appearing mucosa, lips cracked, throat is clean and non-erythematous NECK:  no JVD, no thyromegaly, no lymphadenopathy LUNGS:  clear to auscultation bilaterally, normal work of breathing HEART:  normal rate; regular rhythm; normal S1 and S2, no S3 or S4 appreciated; no murmurs, rubs, or clicks ABDOMEN:  soft, +BS, tender to palpation near incision site, ileostomy noted in right lower quadrant covered with ileostomy bag  with a mixture of solid and liquid stool within it, open vertical incision at the midline above the umbilicus packed with gauze and covered in Tegaderm when exposed wound shows debris consistent with normal healing, intact fascial sutures and with palpation produced some purulence from the inferior aspect GENITALS: foley catheter in place EXTREMITIES:  No peripheral edema, 2+ pulses (radial, dorsalis pedis) bilaterally, dry skin noted bilaterally on shins NEURO: 5/5 strength in upper extremities (unable to assess lower extremities due to abdominal pain), EOMI, palate rises symetrically, tongue protrudes to midline SKIN:  normal turgor   Labs: Basic Metabolic Panel:  Lab [id number] 1111 03/30/12 0450 03/29/12 2143  NA 129* 127* 120*  K 4.7 4.5 6.5*  CL 83* 82* 74*  CO2 23 18* 16*  GLUCOSE 110* 86 158*  BUN 151* 158* 156*  CREATININE 17.02* 16.82* 17.38*  CALCIUM 8.3* 8.5 9.3  MG -- -- --  PHOS -- -- --    Liver Function Tests:  Lab 03/30/12 0450 03/29/12 2143  AST 26 33  ALT 18 23  ALKPHOS 104 130*  BILITOT 0.8 1.0  PROT 8.1 9.7*  ALBUMIN 2.8* 3.4*    Lab 03/29/12 2143  LIPASE 143*  AMYLASE --   CBC:  Lab 03/29/12 2143  WBC 14.7*  NEUTROABS 10.1*  HGB 13.5  HCT 36.7*  MCV 68.3*  PLT 358   CBG:  Lab 03/30/12 1154  03/30/12 0734 03/30/12 0202  GLUCAP 114* 94 92    Microbiology: Results for orders placed during the hospital encounter of 03/29/12  MRSA PCR SCREENING     Status: Normal   Collection Time   03/30/12  3:18 AM      Component Value Range Status Comment   MRSA by PCR NEGATIVE  NEGATIVE Final     Imaging: Ct Abdomen Pelvis W Contrast  03/29/2012  *RADIOLOGY REPORT*  Clinical Data: Status post fall.  History of intra-abdominal abscess secondary to the anastomotic leak.  History of colon cancer.  CT ABDOMEN AND PELVIS WITH CONTRAST  Technique:  Multidetector CT imaging of the abdomen and pelvis was performed following the standard protocol during bolus  administration of intravenous contrast.  Contrast: 168mL OMNIPAQUE IOHEXOL 300 MG/ML  SOLN  Comparison: Chest and two views abdomen 02/16/2012.  Findings: The lung bases are clear.  There is no pleural or pericardial effusion.  The patient has a midline surgical wound.  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  The patient is status post right hemicolectomy.  There is no abscess.  There is extensive infiltration of omental fat, particularly on the left.  There is no rim enhancing fluid collection although there are some small foci of fluid density material present.  No evidence of small bowel obstruction is present.  The stomach appears normal.  A few small stones are seen layering dependently in the gallbladder but there is no evidence of cholecystitis.  The liver, adrenal glands, spleen and pancreas appear normal.  Low attenuating lesions in both kidneys are most consistent with cysts. There are lymph nodes in the porta hepatis measuring up to 1.8 cm on image 21. Foley catheter is in place urinary bladder with an air-fluid level identified.  No focal bony abnormality is seen with extensive degenerative disease present about the hips.  IMPRESSION:  1.  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 2.  Extensive infiltration of omental fat, worse on the left, could be due to infectious or inflammatory process or postoperative change.  Omental tumor implants are possible but felt somewhat less likely.  No abscess is identified. 3.  Fatty infiltration of the liver. 4.  Small lymph node in the porta hepatis is nonspecific and likely reactive.  Recommend attention on follow-up exams. 5.  Small gallstones without evidence of cholecystitis.   Original Report Authenticated By: Arvid Right. Luther Parody, M.D.    US Renal  03/30/2012  *RADIOLOGY REPORT*  Clinical Data: Acute renal failure.  RENAL/URINARY TRACT ULTRASOUND COMPLETE  Comparison:  CT scan dated 05/29/2012   Findings:  Right Kidney:  13.4 cm in length.  Several small cysts in the right kidney.  No hydronephrosis. Slightly echogenic renal parenchyma, equal to the liver.  Left Kidney:  12.6 cm in length.  3.3 cm oblong cyst in the upper pole.  No hydronephrosis.  Bladder:  Almost empty.  IMPRESSION:  1.  No hydronephrosis.  Slight increased echogenicity of the renal parenchyma consistent with renal medical disease. 2.  Small benign-appearing cysts on both kidneys.   Original Report Authenticated By: Larey Seat, M.D.       Medications:    Infusions:    .  sodium bicarbonate infusion sterile water 1000 mL 250 mL/hr at 03/30/12 0416   Followed by  .  sodium bicarbonate infusion sterile water 1000 mL 150 mL/hr at 03/30/12 1200  . DISCONTD: sodium chloride 1,000 mL (03/30/12 0029)  .  DISCONTD:  sodium bicarbonate infusion sterile water 1000 mL       Scheduled Medications:    . sodium chloride  1,000 mL Intravenous Once  . albuterol  5 mg Nebulization Once  . antiseptic oral rinse  15 mL Mouth Rinse BID  . atorvastatin  20 mg Oral QHS  . calcium gluconate 1 GM IV  1 g Intravenous Once  . dextrose  1 ampule Intravenous Once  . heparin  5,000 Units Subcutaneous Q8H  . HYDROmorphone  1 mg Intravenous Once  . insulin aspart      . insulin aspart  0-9 Units Subcutaneous Q4H  . insulin aspart  10 Units Intravenous Once  . insulin glargine  10 Units Subcutaneous QHS  . ondansetron      . ondansetron  4 mg Intravenous Once  . sodium bicarbonate  50 mEq Intravenous Once  . sodium chloride  1,000 mL Intravenous Once  . sodium chloride  2,000 mL Intravenous Once  . sodium chloride  3 mL Intravenous Q12H  . sodium polystyrene  45 g Oral Once  . white petrolatum      . DISCONTD: carvedilol  6.25 mg Oral BID WC     PRN Medications: iohexol, morphine injection, ondansetron (ZOFRAN) IV, ondansetron    Assessment/ Plan:    Mr. Camerino is a 61 yo gentleman with a history of type 2 diabetes  on insulin, invasive adenocarcinoma of colon s/p right hemicolectomy complicated by postop intra-abdominal abscess with a leaking anastamosis, OSA, hypertension, currently in a SNF, who presents with nausea, vomiting, decreased urination, and weight loss.   Acute renal failure - Creatinine 17.02, BUN 151 (baseline Cr 0.9-1)  Likely secondary to extreme volume depletion in the setting of nausea, vomiting, increased ileostomy output. On 02/05/12 he weighed 256 lbs, on admission today, his weight was 197. Appears volume depleted on exam. Recent IV contrast used for abdominal CT. Despite elevated BUN, patient is alert and oriented. No indications for urgent dialysis. Anion gap is 30, metabolic acidosis likely due to renal failure. Renal US showed no hydronephrosis and bilateral cysts. -renal following  -aggressive rehydration with NS at 150cc/hr, NaHCO3 (already received 3L NS bolus)  -strict I and O's, daily weights  -zofran PRN   -follow BMP   Hyperkalemia -K has corrected to 4.7 as of 1111 10/28 K was 6.5 on admission with slightly peaked T waves on admission EKG. Given insulin, ca gluconate, and 45 mg Kayexalate the ED and repeat EKG showed resolution of the peaked T. waves at 3 AM. Likely 2/2 ARF.  -monitor with BMP  Colon adenocarcinoma S/p right hemicolectomy and ileostomy creation  Elevated WBC to 14.7, afebrile, hx of abdominal abscess 2/2 leaking anastamosis. CT scan abd/pelvis with inflammation of omental fat, no focal abscess noted, no obstruction. Pt with abdominal pain around incision, no rebound or guarding. Ileostomy site appears clear, +output. Does not appear septic.  -Surgery has been consulted  -ileostomy care -follow CBC, vitals  -Morphine 2-4 mg q4 hours prn for analgesia  Diabetes:  -hold metformin as pt with metabolic acidosis and renal failure  -lantus 10 units daily with correction scale  -Check blood sugar every 4 hours   Hypotension: Pt has has had two occasions of  hypotension 84/60 @ 0645 and 100/59 @ 1152 both on 10/28, likely due to hypovolemia -hold home carvedilol -continue aggressive rehydration  Hyperlipidemia: -Continue home atorvastatin -Goal of LDL <70  VTE Prophylaxis:  -heparin Cotter   Diet:  -NPO except  sips with medications until surgery recommendations have been made        Length of Stay: 1 days   This is a Careers information officer Note.  The care of the patient was discussed with Dr. Eulas Post and the assessment and plan formulated with their assistance.  Please see their attached note or addendum for official documentation of the daily encounter.

## 2012-03-30 NOTE — Progress Notes (Signed)
Orthostatics as follows:  Lying: HR 89 BP114/74 Sitting: HR 93 BP100/73 Standing: HR 96 BP84/60  Also of Note: Lab has been repeatedly paged regarding pt's lab work; finally drawn at 0445-still waiting for result-lab paged at 0650 and just completing results. Will continue to follow.

## 2012-03-30 NOTE — Consult Note (Signed)
Reason for Consult:Wound check; high ileostomy output Referring Physician: [name] is an 62 y.o. male.  HPI: I am familiar with this patient.  S/p right hemicolectomy for adenocarcinoma complicated by anastomotic leak with intra-abdominal abscess - open abdomen for about 2 weeks.  Now with end ileostomy and long Hartman's pouch.  Patient was discharged to SNF on 10/7.  Apparently, he was admitted in acute renal failure secondary to severe volume depletion.  This may be secondary to large amounts of ileostomy output.  The patient states that the ileostomy output has been very watery - like urine.  He is awake and alert with complaints only of thirst.  Past Medical History  Diagnosis Date  . Hyperlipidemia   . Hypertension   . Heart murmur   . Diabetes mellitus     type 2 IDDM x 15 years  . Sleep apnea     does not wear CPAP now  . Colon polyp   . Arthritis     left knee  . 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 12/25/2011    Past Surgical History  Procedure Date  . Colonoscopy   . Ganglion cyst excision 20 YRS AGO    RT ARM  . Partial colectomy 02/06/2012  . Partial colectomy 02/06/2012    Procedure: PARTIAL COLECTOMY;  Surgeon: Imogene Burn. [name], MD;  Location: [place];  Service: General;  Laterality: N/A;  right partial colectomy  . Colonoscopy 02/06/2012    Procedure: COLONOSCOPY;  Surgeon: Milus Banister, MD;  Location: [place];  Service: Endoscopy;;  . Laparotomy 02/16/2012    Procedure: EXPLORATORY LAPAROTOMY;  Surgeon: Zenovia Jarred, MD;  Location: [place];  Service: General;  Laterality: N/A;  Exploratory Laparotomy with resection of anastomosis  . Ileostomy 02/16/2012    Procedure: ILEOSTOMY;  Surgeon: Zenovia Jarred, MD;  Location: [place];  Service: General;  Laterality: N/A;  . Application of wound vac 02/16/2012    Procedure: APPLICATION OF WOUND VAC;  Surgeon: Zenovia Jarred, MD;  Location: [place];  Service: General;  Laterality: N/A;  . Laparotomy  02/18/2012    Procedure: EXPLORATORY LAPAROTOMY;  Surgeon: Odis Hollingshead, MD;  Location: [place];  Service: General;  Laterality: N/A;  exploratory laparotomy  change of abdominal vac dressing  . Dressing change under anesthesia 02/18/2012    Procedure: DRESSING CHANGE UNDER ANESTHESIA;  Surgeon: Odis Hollingshead, MD;  Location: [place];  Service: General;  Laterality: N/A;  . Laparotomy 02/20/2012    Procedure: EXPLORATORY LAPAROTOMY;  Surgeon: Odis Hollingshead, MD;  Location: [place];  Service: General;  Laterality: N/A;  . Vacuum assisted closure change 02/20/2012    Procedure: ABDOMINAL VACUUM ASSISTED CLOSURE CHANGE;  Surgeon: Odis Hollingshead, MD;  Location: [place];  Service: General;;  . Laparotomy 02/23/2012    Procedure: EXPLORATORY LAPAROTOMY;  Surgeon: Joyice Faster. Cornett, MD;  Location: [place];  Service: General;  Laterality: N/A;  Irrigation and Debridement of abdominal wound with wound vac change with partial closure  . Incision and drainage of wound 02/23/2012    Procedure: IRRIGATION AND DEBRIDEMENT WOUND;  Surgeon: Joyice Faster. Cornett, MD;  Location: [place];  Service: General;  Laterality: N/A;  . Vacuum assisted closure change 02/23/2012    Procedure: ABDOMINAL VACUUM ASSISTED CLOSURE CHANGE;  Surgeon: Joyice Faster. Cornett, MD;  Location: [place];  Service: General;  Laterality: N/A;  . Laparotomy 02/26/2012    Procedure: EXPLORATORY LAPAROTOMY;  Surgeon: Imogene Burn. Paz Fuentes,  MD;  Location: [place];  Service: General;  Laterality: N/A;     . Application of wound vac 02/26/2012    Procedure: APPLICATION OF WOUND VAC;  Surgeon: Imogene Burn. [name], MD;  Location: [place];  Service: General;  Laterality: N/A;    Family History  Problem Relation Age of Onset  . Diabetes Father   . Heart disease Father   . Colon cancer Neg Hx   . Diabetes Sister   . Heart disease Sister   . Heart disease Mother     Social History:  reports that he has been smoking Cigarettes.  He has a 15 pack-year smoking history. He  has never used smokeless tobacco. He reports that he drinks alcohol. He reports that he does not use illicit drugs.  Allergies: No Known Allergies  Medications: I have reviewed the patient's current medications.  Results for orders placed during the hospital encounter of 03/29/12 (from the past 48 hour(s))  URINALYSIS, MICROSCOPIC ONLY     Status: Abnormal   Collection Time   03/29/12  7:41 PM      Component Value Range Comment   Color, Urine AMBER (*) YELLOW BIOCHEMICALS MAY BE AFFECTED BY COLOR   APPearance CLOUDY (*) CLEAR    Specific Gravity, Urine 1.021  1.005 - 1.030    pH 5.0  5.0 - 8.0    Glucose, UA NEGATIVE  NEGATIVE mg/dL    Hgb urine dipstick TRACE (*) NEGATIVE    Bilirubin Urine SMALL (*) NEGATIVE    Ketones, ur TRACE (*) NEGATIVE mg/dL    Protein, ur 30 (*) NEGATIVE mg/dL    Urobilinogen, UA 0.2  0.0 - 1.0 mg/dL    Nitrite NEGATIVE  NEGATIVE    Leukocytes, UA NEGATIVE  NEGATIVE    Bacteria, UA RARE  RARE    Squamous Epithelial / LPF RARE  RARE    Urine-Other AMORPHOUS URATES/PHOSPHATES     SODIUM, URINE, RANDOM     Status: Normal   Collection Time   03/29/12  7:41 PM      Component Value Range Comment   Sodium, Ur 8     COMPREHENSIVE METABOLIC PANEL     Status: Abnormal   Collection Time   03/29/12  9:43 PM      Component Value Range Comment   Sodium 120 (*) 135 - 145 mEq/L    Potassium 6.5 (*) 3.5 - 5.1 mEq/L    Chloride 74 (*) 96 - 112 mEq/L    CO2 16 (*) 19 - 32 mEq/L    Glucose, Bld 158 (*) 70 - 99 mg/dL    BUN 156 (*) 6 - 23 mg/dL REPEATED TO VERIFY   Creatinine, Ser 17.38 (*) 0.50 - 1.35 mg/dL REPEATED TO VERIFY   Calcium 9.3  8.4 - 10.5 mg/dL    Total Protein 9.7 (*) 6.0 - 8.3 g/dL    Albumin 3.4 (*) 3.5 - 5.2 g/dL    AST 33  0 - 37 U/L    ALT 23  0 - 53 U/L    Alkaline Phosphatase 130 (*) 39 - 117 U/L    Total Bilirubin 1.0  0.3 - 1.2 mg/dL    GFR calc non Af Amer 3 (*) >90 mL/min    GFR calc Af Amer 3 (*) >90 mL/min   LIPASE, BLOOD     Status:  Abnormal   Collection Time   03/29/12  9:43 PM      Component Value Range Comment   Lipase 143 (*)  11 - 59 U/L   CBC WITH DIFFERENTIAL     Status: Abnormal   Collection Time   03/29/12  9:43 PM      Component Value Range Comment   WBC 14.7 (*) 4.0 - 10.5 K/uL    RBC 5.37  4.22 - 5.81 MIL/uL    Hemoglobin 13.5  13.0 - 17.0 g/dL    HCT 36.7 (*) 39.0 - 52.0 %    MCV 68.3 (*) 78.0 - 100.0 fL    MCH 25.1 (*) 26.0 - 34.0 pg    MCHC 36.8 (*) 30.0 - 36.0 g/dL    RDW 17.8 (*) 11.5 - 15.5 %    Platelets 358  150 - 400 K/uL    Neutrophils Relative 69  43 - 77 %    Lymphocytes Relative 27  12 - 46 %    Monocytes Relative 4  3 - 12 %    Eosinophils Relative 0  0 - 5 %    Basophils Relative 0  0 - 1 %    Neutro Abs 10.1 (*) 1.7 - 7.7 K/uL    Lymphs Abs 4.0  0.7 - 4.0 K/uL    Monocytes Absolute 0.6  0.1 - 1.0 K/uL    Eosinophils Absolute 0.0  0.0 - 0.7 K/uL    Basophils Absolute 0.0  0.0 - 0.1 K/uL    RBC Morphology ROULEAUX      WBC Morphology ATYPICAL LYMPHOCYTES     GLUCOSE, CAPILLARY     Status: Normal   Collection Time   03/30/12  2:02 AM      Component Value Range Comment   Glucose-Capillary 92  70 - 99 mg/dL    Comment 1 Notify RN     MRSA PCR SCREENING     Status: Normal   Collection Time   03/30/12  3:18 AM      Component Value Range Comment   MRSA by PCR NEGATIVE  NEGATIVE   URINALYSIS, ROUTINE W REFLEX MICROSCOPIC     Status: Abnormal   Collection Time   03/30/12  3:45 AM      Component Value Range Comment   Color, Urine AMBER (*) YELLOW BIOCHEMICALS MAY BE AFFECTED BY COLOR   APPearance CLOUDY (*) CLEAR    Specific Gravity, Urine 1.029  1.005 - 1.030    pH 5.0  5.0 - 8.0    Glucose, UA NEGATIVE  NEGATIVE mg/dL    Hgb urine dipstick SMALL (*) NEGATIVE    Bilirubin Urine SMALL (*) NEGATIVE    Ketones, ur NEGATIVE  NEGATIVE mg/dL    Protein, ur 100 (*) NEGATIVE mg/dL    Urobilinogen, UA 0.2  0.0 - 1.0 mg/dL    Nitrite NEGATIVE  NEGATIVE    Leukocytes, UA SMALL (*)  NEGATIVE   URINE MICROSCOPIC-ADD ON     Status: Abnormal   Collection Time   03/30/12  3:45 AM      Component Value Range Comment   Squamous Epithelial / LPF FEW (*) RARE    WBC, UA 7-10  <3 WBC/hpf    RBC / HPF 3-6  <3 RBC/hpf    Bacteria, UA RARE  RARE    Casts HYALINE CASTS (*) NEGATIVE GRANULAR CAST  COMPREHENSIVE METABOLIC PANEL     Status: Abnormal   Collection Time   03/30/12  4:50 AM      Component Value Range Comment   Sodium 127 (*) 135 - 145 mEq/L    Potassium 4.5  3.5 - 5.1  mEq/L    Chloride 82 (*) 96 - 112 mEq/L    CO2 18 (*) 19 - 32 mEq/L    Glucose, Bld 86  70 - 99 mg/dL    BUN 158 (*) 6 - 23 mg/dL    Creatinine, Ser 16.82 (*) 0.50 - 1.35 mg/dL DELTA CHECK NOTED   Calcium 8.5  8.4 - 10.5 mg/dL    Total Protein 8.1  6.0 - 8.3 g/dL    Albumin 2.8 (*) 3.5 - 5.2 g/dL    AST 26  0 - 37 U/L    ALT 18  0 - 53 U/L    Alkaline Phosphatase 104  39 - 117 U/L    Total Bilirubin 0.8  0.3 - 1.2 mg/dL    GFR calc non Af Amer 3 (*) >90 mL/min    GFR calc Af Amer 3 (*) >90 mL/min   GLUCOSE, CAPILLARY     Status: Normal   Collection Time   03/30/12  7:34 AM      Component Value Range Comment   Glucose-Capillary 94  70 - 99 mg/dL    Comment 1 Notify RN      Comment 2 Documented in Chart     BASIC METABOLIC PANEL     Status: Abnormal   Collection Time   03/30/12 11:11 AM      Component Value Range Comment   Sodium 129 (*) 135 - 145 mEq/L    Potassium 4.7  3.5 - 5.1 mEq/L    Chloride 83 (*) 96 - 112 mEq/L    CO2 23  19 - 32 mEq/L    Glucose, Bld 110 (*) 70 - 99 mg/dL    BUN 151 (*) 6 - 23 mg/dL    Creatinine, Ser 17.02 (*) 0.50 - 1.35 mg/dL    Calcium 8.3 (*) 8.4 - 10.5 mg/dL    GFR calc non Af Amer 3 (*) >90 mL/min    GFR calc Af Amer 3 (*) >90 mL/min   GLUCOSE, CAPILLARY     Status: Abnormal   Collection Time   03/30/12 11:54 AM      Component Value Range Comment   Glucose-Capillary 114 (*) 70 - 99 mg/dL     Ct Abdomen Pelvis W Contrast  03/29/2012  *RADIOLOGY  REPORT*  Clinical Data: Status post fall.  History of intra-abdominal abscess secondary to the anastomotic leak.  History of colon cancer.  CT ABDOMEN AND PELVIS WITH CONTRAST  Technique:  Multidetector CT imaging of the abdomen and pelvis was performed following the standard protocol during bolus administration of intravenous contrast.  Contrast: 120mL OMNIPAQUE IOHEXOL 300 MG/ML  SOLN  Comparison: Chest and two views abdomen 02/16/2012.  Findings: The lung bases are clear.  There is no pleural or pericardial effusion.  The patient has a midline surgical wound.  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  The patient is status post right hemicolectomy.  There is no abscess.  There is extensive infiltration of omental fat, particularly on the left.  There is no rim enhancing fluid collection although there are some small foci of fluid density material present.  No evidence of small bowel obstruction is present.  The stomach appears normal.  A few small stones are seen layering dependently in the gallbladder but there is no evidence of cholecystitis.  The liver, adrenal glands, spleen and pancreas appear normal.  Low attenuating lesions in both kidneys are most consistent with cysts. There are lymph nodes in the porta hepatis measuring  up to 1.8 cm on image 21. Foley catheter is in place urinary bladder with an air-fluid level identified.  No focal bony abnormality is seen with extensive degenerative disease present about the hips.  IMPRESSION:  1.  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 2.  Extensive infiltration of omental fat, worse on the left, could be due to infectious or inflammatory process or postoperative change.  Omental tumor implants are possible but felt somewhat less likely.  No abscess is identified. 3.  Fatty infiltration of the liver. 4.  Small lymph node in the porta hepatis is nonspecific and likely reactive.  Recommend attention on  follow-up exams. 5.  Small gallstones without evidence of cholecystitis.   Original Report Authenticated By: Arvid Right. Luther Parody, M.D.    US Renal  03/30/2012  *RADIOLOGY REPORT*  Clinical Data: Acute renal failure.  RENAL/URINARY TRACT ULTRASOUND COMPLETE  Comparison:  CT scan dated 05/29/2012  Findings:  Right Kidney:  13.4 cm in length.  Several small cysts in the right kidney.  No hydronephrosis. Slightly echogenic renal parenchyma, equal to the liver.  Left Kidney:  12.6 cm in length.  3.3 cm oblong cyst in the upper pole.  No hydronephrosis.  Bladder:  Almost empty.  IMPRESSION:  1.  No hydronephrosis.  Slight increased echogenicity of the renal parenchyma consistent with renal medical disease. 2.  Small benign-appearing cysts on both kidneys.   Original Report Authenticated By: Larey Seat, M.D.     ROS Blood pressure 100/59, pulse 73, temperature 97.6 F (36.4 C), temperature source Oral, resp. rate 16, height 6' (1.829 m), weight 197 lb 1.5 oz (89.4 kg), SpO2 100.00%. Physical Exam WDWN in NAD Oral mucosa - dry Abd - soft, non-tender; Ileostomy - pink; thin watery output, but some stool in bag Wound - 5 x 2 cm - well-granulated; fibrinous exudate at upper and lower ends; mild amount of tunneling; visible sutures  Assessment/Plan: 1.  Hydration/ correction of electrolytes per primary team 2.  Wound - some debridement at bedside; continue daily wet to dry dressings 3.  Antibiotics - inflammation of omentum is not unexpected due to prolonged open abdomen; do not feel strongly that patient needs antibiotic treatment for this 4.  OK with diet - will start renal diet  Will follow.  Imogene Burn. [name], MD, [facility] Surgery  03/30/2012 3:06 PM

## 2012-03-30 NOTE — Progress Notes (Signed)
INITIAL ADULT NUTRITION ASSESSMENT Date: 03/30/2012   Time: 1:52 PM  Reason for Assessment: MD Consult   INTERVENTION:  Given phosphorus and potassium WNL, do not feel that patient needs a renal diet restriction.    Recommend liberalize diet to CHO-modified Medium/Heart Healthy.  Add Resource Breeze PO TID to maximize intake protein and calories.  DOCUMENTATION CODES Per approved criteria  -Severe malnutrition in the context of chronic illness   ASSESSMENT: Male 61 y.o.  Dx: Acute renal failure  Hx:  Past Medical History  Diagnosis Date  . Hyperlipidemia   . Hypertension   . Heart murmur   . Diabetes mellitus     type 2 IDDM x 15 years  . Sleep apnea     does not wear CPAP now  . Colon polyp   . Arthritis     left knee  . 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 12/25/2011    Past Surgical History  Procedure Date  . Colonoscopy   . Ganglion cyst excision 20 YRS AGO    RT ARM  . Partial colectomy 02/06/2012  . Partial colectomy 02/06/2012    Procedure: PARTIAL COLECTOMY;  Surgeon: Imogene Burn. [name], MD;  Location: [place];  Service: General;  Laterality: N/A;  right partial colectomy  . Colonoscopy 02/06/2012    Procedure: COLONOSCOPY;  Surgeon: Milus Banister, MD;  Location: [place];  Service: Endoscopy;;  . Laparotomy 02/16/2012    Procedure: EXPLORATORY LAPAROTOMY;  Surgeon: Zenovia Jarred, MD;  Location: [place];  Service: General;  Laterality: N/A;  Exploratory Laparotomy with resection of anastomosis  . Ileostomy 02/16/2012    Procedure: ILEOSTOMY;  Surgeon: Zenovia Jarred, MD;  Location: [place];  Service: General;  Laterality: N/A;  . Application of wound vac 02/16/2012    Procedure: APPLICATION OF WOUND VAC;  Surgeon: Zenovia Jarred, MD;  Location: [place];  Service: General;  Laterality: N/A;  . Laparotomy 02/18/2012    Procedure: EXPLORATORY LAPAROTOMY;  Surgeon: Odis Hollingshead, MD;  Location: [place];  Service: General;  Laterality: N/A;  exploratory  laparotomy  change of abdominal vac dressing  . Dressing change under anesthesia 02/18/2012    Procedure: DRESSING CHANGE UNDER ANESTHESIA;  Surgeon: Odis Hollingshead, MD;  Location: [place];  Service: General;  Laterality: N/A;  . Laparotomy 02/20/2012    Procedure: EXPLORATORY LAPAROTOMY;  Surgeon: Odis Hollingshead, MD;  Location: [place];  Service: General;  Laterality: N/A;  . Vacuum assisted closure change 02/20/2012    Procedure: ABDOMINAL VACUUM ASSISTED CLOSURE CHANGE;  Surgeon: Odis Hollingshead, MD;  Location: [place];  Service: General;;  . Laparotomy 02/23/2012    Procedure: EXPLORATORY LAPAROTOMY;  Surgeon: Joyice Faster. Cornett, MD;  Location: [place];  Service: General;  Laterality: N/A;  Irrigation and Debridement of abdominal wound with wound vac change with partial closure  . Incision and drainage of wound 02/23/2012    Procedure: IRRIGATION AND DEBRIDEMENT WOUND;  Surgeon: Joyice Faster. Cornett, MD;  Location: [place];  Service: General;  Laterality: N/A;  . Vacuum assisted closure change 02/23/2012    Procedure: ABDOMINAL VACUUM ASSISTED CLOSURE CHANGE;  Surgeon: Joyice Faster. Cornett, MD;  Location: [place];  Service: General;  Laterality: N/A;  . Laparotomy 02/26/2012    Procedure: EXPLORATORY LAPAROTOMY;  Surgeon: Imogene Burn. [name], MD;  Location: [place];  Service: General;  Laterality: N/A;     . Application of wound vac 02/26/2012    Procedure: APPLICATION OF WOUND VAC;  Surgeon: Imogene Burn. [name], MD;  Location: [facility];  Service: General;  Laterality: N/A;    Related Meds:  Scheduled Meds:   . sodium chloride  1,000 mL Intravenous Once  . albuterol  5 mg Nebulization Once  . antiseptic oral rinse  15 mL Mouth Rinse BID  . atorvastatin  20 mg Oral QHS  . calcium gluconate 1 GM IV  1 g Intravenous Once  . dextrose  1 ampule Intravenous Once  . heparin  5,000 Units Subcutaneous Q8H  . HYDROmorphone  1 mg Intravenous Once  . insulin aspart      . insulin aspart  0-9 Units Subcutaneous Q4H  .  insulin aspart  10 Units Intravenous Once  . insulin glargine  10 Units Subcutaneous QHS  . ondansetron      . ondansetron  4 mg Intravenous Once  . sodium bicarbonate  50 mEq Intravenous Once  . sodium chloride  1,000 mL Intravenous Once  . sodium chloride  2,000 mL Intravenous Once  . sodium chloride  3 mL Intravenous Q12H  . sodium polystyrene  45 g Oral Once  . white petrolatum      . DISCONTD: carvedilol  6.25 mg Oral BID WC   Continuous Infusions:   .  sodium bicarbonate infusion sterile water 1000 mL 250 mL/hr at 03/30/12 0416   Followed by  .  sodium bicarbonate infusion sterile water 1000 mL 150 mL/hr at 03/30/12 1200  . DISCONTD: sodium chloride 1,000 mL (03/30/12 0029)  . DISCONTD:  sodium bicarbonate infusion sterile water 1000 mL     PRN Meds:.iohexol, morphine injection, ondansetron (ZOFRAN) IV, ondansetron   Ht: 6' (182.9 cm)  Wt: 197 lb 1.5 oz (89.4 kg)  Ideal Wt: 80.9 kg % Ideal Wt: 111%  Wt Readings from Last 10 Encounters:  03/30/12 197 lb 1.5 oz (89.4 kg)  03/05/12 203 lb 4.2 oz (92.2 kg)  03/05/12 203 lb 4.2 oz (92.2 kg)  03/05/12 203 lb 4.2 oz (92.2 kg)  03/05/12 203 lb 4.2 oz (92.2 kg)  03/05/12 203 lb 4.2 oz (92.2 kg)  03/05/12 203 lb 4.2 oz (92.2 kg)  02/13/12 265 lb 6.9 oz (120.4 kg)  02/13/12 265 lb 6.9 oz (120.4 kg)  01/30/12 258 lb 14.4 oz (117.436 kg)   Usual Wt: 265 lb (1.5 months ago) % Usual Wt: 74%  Body mass index is 26.73 kg/(m^2).  Food/Nutrition Related Hx: recent poor PO intake due to nausea/vomiting; 26% weight loss in 1.5 months related to recent cancer diagnosis and inadequate oral intake.  Labs:  CMP     Component Value Date/Time   NA 129* 03/30/2012 1111   K 4.7 03/30/2012 1111   CL 83* 03/30/2012 1111   CO2 23 03/30/2012 1111   GLUCOSE 110* 03/30/2012 1111   BUN 151* 03/30/2012 1111   CREATININE 17.02* 03/30/2012 1111   CREATININE 1.12 07/04/2011 1516   CALCIUM 8.3* 03/30/2012 1111   PROT 8.1 03/30/2012 0450    ALBUMIN 2.8* 03/30/2012 0450   AST 26 03/30/2012 0450   ALT 18 03/30/2012 0450   ALKPHOS 104 03/30/2012 0450   BILITOT 0.8 03/30/2012 0450   GFRNONAA 3* 03/30/2012 1111   GFRAA 3* 03/30/2012 1111    CBG (last 3)   Basename 03/30/12 1154 03/30/12 0734 03/30/12 0202  GLUCAP 114* 94 92    Sodium  Date/Time Value Range Status  03/30/2012 11:11 AM 129* 135 - 145 mEq/L Final  03/30/2012  4:50 AM 127* 135 - 145 mEq/L  Final  03/29/2012  9:43 PM 120* 135 - 145 mEq/L Final    Potassium  Date/Time Value Range Status  03/30/2012 11:11 AM 4.7  3.5 - 5.1 mEq/L Final  03/30/2012  4:50 AM 4.5  3.5 - 5.1 mEq/L Final  03/29/2012  9:43 PM 6.5* 3.5 - 5.1 mEq/L Final     CRITICAL RESULT CALLED TO, READ BACK BY AND VERIFIED WITH:     C WITHROW AT 2245 ON 10.27.2013 BY NBROOKS     NO VISIBLE HEMOLYSIS    Phosphorus  Date/Time Value Range Status  03/02/2012  3:59 AM 3.0  2.3 - 4.6 mg/dL Final  02/27/2012  4:26 AM 3.0  2.3 - 4.6 mg/dL Final  02/24/2012  2:25 AM 3.6  2.3 - 4.6 mg/dL Final    Magnesium  Date/Time Value Range Status  03/03/2012  6:10 AM 1.8  1.5 - 2.5 mg/dL Final  03/02/2012  3:59 AM 1.7  1.5 - 2.5 mg/dL Final  03/01/2012  4:50 AM 1.8  1.5 - 2.5 mg/dL Final      Intake/Output Summary (Last 24 hours) at 03/30/12 1354 Last data filed at 03/30/12 1202  Gross per 24 hour  Intake 2795.33 ml  Output   2490 ml  Net 305.33 ml    Diet Order: NPO   IVF:    sodium bicarbonate infusion sterile water 1000 mL Last Rate: 250 mL/hr at 03/30/12 0416  Followed by   sodium bicarbonate infusion sterile water 1000 mL Last Rate: 150 mL/hr at 03/30/12 1200  DISCONTD: sodium chloride Last Rate: 1,000 mL (03/30/12 0029)  DISCONTD:  sodium bicarbonate infusion sterile water 1000 mL     Estimated Nutritional Needs:   Kcal: 2150-2350 Protein: 105-120 gm Fluid: 2.2-2.4 liters  RD performed nutrition focused physical exam.  Patient with muscle wasting in the temples and clavicles, and  subcutaneous fat loss in the triceps and orbital areas.   Pt meets criteria for severe malnutrition in the context of chronic illness as evidenced by 26% weight loss in 1.5 months and severe loss of muscle mass and subcutaneous fat.    Patient reports recently has been unable to keep anything down.  Admitted with dehydration and generalized weakness.  Per RN concern on admission was of a possible fistula from GI tract to abdominal wound.  Surgery in to see patient at this time, suspect patient does not have a fistula; diet being advanced to renal diet.    NUTRITION DIAGNOSIS: Malnutrition related to suboptimal oral intake as evidenced by 26% weight loss in 1.5 months and severe loss of muscle mass and subcutaneous fat.  MONITORING/EVALUATION(Goals): Goal:  Intake to meet >90% of estimated nutrition needs. Monitor:  PO intake, labs, weight trend, renal function.    EDUCATION NEEDS: -Education needs addressed, discussed the need for adequate intake of protein and calories to promote healing.   Molli Barrows, RD, LDN, [facility] Pager# [phone] After Hours Pager# [phone]  03/30/2012, 1:52 PM

## 2012-03-30 NOTE — Evaluation (Signed)
Physical Therapy Evaluation Patient Details Name: [name] MRN: [id number] DOB: May 26, 1951 Today's Date: 03/30/2012 Time: [id number] PT Time Calculation (min): 21 min  PT Assessment / Plan / Recommendation Clinical Impression  pt adm from SNF with episode of N/V and not producing enough urine.  Mobility limited by dizziness due to low BP, general weakness and generally not feeling well.  Will see on acute.    PT Assessment  Patient needs continued PT services    Follow Up Recommendations  Post acute inpatient    Does the patient have the potential to tolerate intense rehabilitation   No, Recommend SNF  Barriers to Discharge None      Equipment Recommendations  3 in 1 bedside comode    Recommendations for Other Services     Frequency Min 3X/week    Precautions / Restrictions Precautions Precautions: Fall Restrictions Weight Bearing Restrictions: No   Pertinent Vitals/Pain 99/65 sitting EOB      Mobility  Bed Mobility Bed Mobility: Rolling Left;Left Sidelying to Sit;Sit to Supine Rolling Left: 4: Min guard Left Sidelying to Sit: 1: +2 Total assist;With rails Left Sidelying to Sit: Patient Percentage: 60% Sitting - Scoot to Edge of Bed: 5: Supervision Details for Bed Mobility Assistance: vc's for safe technique; truncal assist Transfers Transfers: Lateral/Scoot Transfers Lateral/Scoot Transfers: 4: Min assist Details for Transfer Assistance: pt deferred standing due to not feeling well and BP dropping; VC;s for lateral scooting for technique Ambulation/Gait Ambulation/Gait Assistance: Not tested (comment) Stairs: No Wheelchair Mobility Wheelchair Mobility: No    Shoulder Instructions     Exercises     PT Diagnosis: Generalized weakness  PT Problem List: Decreased strength;Decreased activity tolerance;Decreased balance;Decreased mobility;Cardiopulmonary status limiting activity PT Treatment Interventions: DME instruction;Gait training;Functional  mobility training;Therapeutic activities;Balance training;Patient/family education   PT Goals Acute Rehab PT Goals PT Goal Formulation: With patient Time For Goal Achievement: 04/13/12 Potential to Achieve Goals: Good Pt will Roll Supine to Left Side: with modified independence;with rail PT Goal: Rolling Supine to Left Side - Progress: Goal set today Pt will go Supine/Side to Sit: with supervision;with HOB 0 degrees PT Goal: Supine/Side to Sit - Progress: Goal set today Pt will go Sit to Supine/Side: with supervision;with HOB 0 degrees PT Goal: Sit to Supine/Side - Progress: Met Pt will go Sit to Stand: with supervision PT Goal: Sit to Stand - Progress: Goal set today Pt will go Stand to Sit: with supervision PT Goal: Stand to Sit - Progress: Goal set today Pt will Transfer Bed to Chair/Chair to Bed: with min assist PT Transfer Goal: Bed to Chair/Chair to Bed - Progress: Goal set today Pt will Ambulate: 51 - 150 feet;with min assist;with rolling walker PT Goal: Ambulate - Progress: Goal set today  Visit Information  Last PT Received On: 03/30/12 Assistance Needed: +2 (safety only) PT/OT Co-Evaluation/Treatment: Yes    Subjective Data  Subjective: I just feel really bad   Prior Functioning  Home Living Lives With: Alone;Daughter Available Help at Discharge: Family Type of Home: House Home Access: Stairs to enter Technical brewer of Steps: 5 Entrance Stairs-Rails: Right;Left;Can reach both Home Layout: One level Bathroom Shower/Tub: Walk-in shower;Tub/shower unit Home Adaptive Equipment: Walker - rolling Prior Function Level of Independence: Independent Able to Take Stairs?: Reciprically Driving: Yes Vocation: Part time employment Communication Communication: No difficulties Dominant Hand: Right    Cognition  Overall Cognitive Status: Appears within functional limits for tasks assessed/performed Area of Impairment: Following commands;Safety/judgement;Problem  solving Arousal/Alertness: Awake/alert Orientation Level: Appears intact  for tasks assessed Behavior During Session: [facility] for tasks performed Current Attention Level: Sustained Following Commands: Follows multi-step commands inconsistently Safety/Judgement: Decreased awareness of safety precautions;Decreased safety judgement for tasks assessed    Extremity/Trunk Assessment Right Lower Extremity Assessment RLE ROM/Strength/Tone: Deficits RLE ROM/Strength/Tone Deficits: strength>3/5 RLE Sensation: History of peripheral neuropathy Left Lower Extremity Assessment LLE ROM/Strength/Tone: Deficits LLE ROM/Strength/Tone Deficits: strength> 3/5 LLE Sensation: History of peripheral neuropathy   Balance Static Sitting Balance Static Sitting - Balance Support: Feet supported;Bilateral upper extremity supported Static Sitting - Level of Assistance: 5: Stand by assistance  End of Session PT - End of Session Activity Tolerance: Patient tolerated treatment well Patient left: in bed;with call bell/phone within reach Nurse Communication: Mobility status  GP     Joline Encalada, Tessie Fass 03/30/2012, 1:32 PM  03/30/2012  Donnella Sham, PT [phone] (pager)

## 2012-03-30 NOTE — H&P (Signed)
Internal Medicine Teaching Service Attending Note Date: 03/30/2012  Patient name: [name]  Medical record number: [id number]  Date of birth: 11/02/1950   I have seen and evaluated [name] and discussed their care with the Residency Team.  61 yo M with hx of DM2 and colectomy due to colon Ca (0000000), complicated by intra-abd abscess (Cx ?) and leaking anastomosis. He was d/c home on 10-7.  He returns with n/v, decreased urination, increased thirst,  and wt loss. He has had chills at home but no fever.     . sodium chloride  1,000 mL Intravenous Once  . albuterol  5 mg Nebulization Once  . antiseptic oral rinse  15 mL Mouth Rinse BID  . atorvastatin  20 mg Oral QHS  . calcium gluconate 1 GM IV  1 g Intravenous Once  . dextrose  1 ampule Intravenous Once  . heparin  5,000 Units Subcutaneous Q8H  . HYDROmorphone  1 mg Intravenous Once  . insulin aspart      . insulin aspart  0-9 Units Subcutaneous Q4H  . insulin aspart  10 Units Intravenous Once  . insulin glargine  10 Units Subcutaneous QHS  . ondansetron      . ondansetron  4 mg Intravenous Once  . sodium bicarbonate  50 mEq Intravenous Once  . sodium chloride  1,000 mL Intravenous Once  . sodium chloride  2,000 mL Intravenous Once  . sodium chloride  3 mL Intravenous Q12H  . sodium polystyrene  45 g Oral Once  . white petrolatum      . DISCONTD: carvedilol  6.25 mg Oral BID WC     Physical Exam: Blood pressure 100/59, pulse 73, temperature 97.6 F (36.4 C), temperature source Oral, resp. rate 16, height 6' (1.829 m), weight 89.4 kg (197 lb 1.5 oz), SpO2 100.00%. General appearance: alert, cooperative and no distress Eyes: negative findings: pupils equal, round, reactive to light and accomodation Throat: abnormal findings: dry! Lungs: clear to auscultation bilaterally Heart: regular rate and rhythm Abdomen: normal findings: bowel sounds normal and soft, non-tender and midline abd wound. d/c on his drerssing  which is removed. non-tender.  colostmy RLQ Extremities: edema none   Lab results: Results for orders placed during the hospital encounter of 03/29/12 (from the past 24 hour(s))  URINALYSIS, MICROSCOPIC ONLY     Status: Abnormal   Collection Time   03/29/12  7:41 PM      Component Value Range   Color, Urine AMBER (*) YELLOW   APPearance CLOUDY (*) CLEAR   Specific Gravity, Urine 1.021  1.005 - 1.030   pH 5.0  5.0 - 8.0   Glucose, UA NEGATIVE  NEGATIVE mg/dL   Hgb urine dipstick TRACE (*) NEGATIVE   Bilirubin Urine SMALL (*) NEGATIVE   Ketones, ur TRACE (*) NEGATIVE mg/dL   Protein, ur 30 (*) NEGATIVE mg/dL   Urobilinogen, UA 0.2  0.0 - 1.0 mg/dL   Nitrite NEGATIVE  NEGATIVE   Leukocytes, UA NEGATIVE  NEGATIVE   Bacteria, UA RARE  RARE   Squamous Epithelial / LPF RARE  RARE   Urine-Other AMORPHOUS URATES/PHOSPHATES    SODIUM, URINE, RANDOM     Status: Normal   Collection Time   03/29/12  7:41 PM      Component Value Range   Sodium, Ur 8    COMPREHENSIVE METABOLIC PANEL     Status: Abnormal   Collection Time   03/29/12  9:43 PM  Component Value Range   Sodium 120 (*) 135 - 145 mEq/L   Potassium 6.5 (*) 3.5 - 5.1 mEq/L   Chloride 74 (*) 96 - 112 mEq/L   CO2 16 (*) 19 - 32 mEq/L   Glucose, Bld 158 (*) 70 - 99 mg/dL   BUN 156 (*) 6 - 23 mg/dL   Creatinine, Ser 17.38 (*) 0.50 - 1.35 mg/dL   Calcium 9.3  8.4 - 10.5 mg/dL   Total Protein 9.7 (*) 6.0 - 8.3 g/dL   Albumin 3.4 (*) 3.5 - 5.2 g/dL   AST 33  0 - 37 U/L   ALT 23  0 - 53 U/L   Alkaline Phosphatase 130 (*) 39 - 117 U/L   Total Bilirubin 1.0  0.3 - 1.2 mg/dL   GFR calc non Af Amer 3 (*) >90 mL/min   GFR calc Af Amer 3 (*) >90 mL/min  LIPASE, BLOOD     Status: Abnormal   Collection Time   03/29/12  9:43 PM      Component Value Range   Lipase 143 (*) 11 - 59 U/L  CBC WITH DIFFERENTIAL     Status: Abnormal   Collection Time   03/29/12  9:43 PM      Component Value Range   WBC 14.7 (*) 4.0 - 10.5 K/uL   RBC  5.37  4.22 - 5.81 MIL/uL   Hemoglobin 13.5  13.0 - 17.0 g/dL   HCT 36.7 (*) 39.0 - 52.0 %   MCV 68.3 (*) 78.0 - 100.0 fL   MCH 25.1 (*) 26.0 - 34.0 pg   MCHC 36.8 (*) 30.0 - 36.0 g/dL   RDW 17.8 (*) 11.5 - 15.5 %   Platelets 358  150 - 400 K/uL   Neutrophils Relative 69  43 - 77 %   Lymphocytes Relative 27  12 - 46 %   Monocytes Relative 4  3 - 12 %   Eosinophils Relative 0  0 - 5 %   Basophils Relative 0  0 - 1 %   Neutro Abs 10.1 (*) 1.7 - 7.7 K/uL   Lymphs Abs 4.0  0.7 - 4.0 K/uL   Monocytes Absolute 0.6  0.1 - 1.0 K/uL   Eosinophils Absolute 0.0  0.0 - 0.7 K/uL   Basophils Absolute 0.0  0.0 - 0.1 K/uL   RBC Morphology ROULEAUX     WBC Morphology ATYPICAL LYMPHOCYTES    GLUCOSE, CAPILLARY     Status: Normal   Collection Time   03/30/12  2:02 AM      Component Value Range   Glucose-Capillary 92  70 - 99 mg/dL   Comment 1 Notify RN    MRSA PCR SCREENING     Status: Normal   Collection Time   03/30/12  3:18 AM      Component Value Range   MRSA by PCR NEGATIVE  NEGATIVE  URINALYSIS, ROUTINE W REFLEX MICROSCOPIC     Status: Abnormal   Collection Time   03/30/12  3:45 AM      Component Value Range   Color, Urine AMBER (*) YELLOW   APPearance CLOUDY (*) CLEAR   Specific Gravity, Urine 1.029  1.005 - 1.030   pH 5.0  5.0 - 8.0   Glucose, UA NEGATIVE  NEGATIVE mg/dL   Hgb urine dipstick SMALL (*) NEGATIVE   Bilirubin Urine SMALL (*) NEGATIVE   Ketones, ur NEGATIVE  NEGATIVE mg/dL   Protein, ur 100 (*) NEGATIVE mg/dL   Urobilinogen, UA  0.2  0.0 - 1.0 mg/dL   Nitrite NEGATIVE  NEGATIVE   Leukocytes, UA SMALL (*) NEGATIVE  URINE MICROSCOPIC-ADD ON     Status: Abnormal   Collection Time   03/30/12  3:45 AM      Component Value Range   Squamous Epithelial / LPF FEW (*) RARE   WBC, UA 7-10  <3 WBC/hpf   RBC / HPF 3-6  <3 RBC/hpf   Bacteria, UA RARE  RARE   Casts HYALINE CASTS (*) NEGATIVE  COMPREHENSIVE METABOLIC PANEL     Status: Abnormal   Collection Time   03/30/12   4:50 AM      Component Value Range   Sodium 127 (*) 135 - 145 mEq/L   Potassium 4.5  3.5 - 5.1 mEq/L   Chloride 82 (*) 96 - 112 mEq/L   CO2 18 (*) 19 - 32 mEq/L   Glucose, Bld 86  70 - 99 mg/dL   BUN 158 (*) 6 - 23 mg/dL   Creatinine, Ser 16.82 (*) 0.50 - 1.35 mg/dL   Calcium 8.5  8.4 - 10.5 mg/dL   Total Protein 8.1  6.0 - 8.3 g/dL   Albumin 2.8 (*) 3.5 - 5.2 g/dL   AST 26  0 - 37 U/L   ALT 18  0 - 53 U/L   Alkaline Phosphatase 104  39 - 117 U/L   Total Bilirubin 0.8  0.3 - 1.2 mg/dL   GFR calc non Af Amer 3 (*) >90 mL/min   GFR calc Af Amer 3 (*) >90 mL/min  GLUCOSE, CAPILLARY     Status: Normal   Collection Time   03/30/12  7:34 AM      Component Value Range   Glucose-Capillary 94  70 - 99 mg/dL   Comment 1 Notify RN     Comment 2 Documented in Chart    BASIC METABOLIC PANEL     Status: Abnormal   Collection Time   03/30/12 11:11 AM      Component Value Range   Sodium 129 (*) 135 - 145 mEq/L   Potassium 4.7  3.5 - 5.1 mEq/L   Chloride 83 (*) 96 - 112 mEq/L   CO2 23  19 - 32 mEq/L   Glucose, Bld 110 (*) 70 - 99 mg/dL   BUN 151 (*) 6 - 23 mg/dL   Creatinine, Ser 17.02 (*) 0.50 - 1.35 mg/dL   Calcium 8.3 (*) 8.4 - 10.5 mg/dL   GFR calc non Af Amer 3 (*) >90 mL/min   GFR calc Af Amer 3 (*) >90 mL/min  GLUCOSE, CAPILLARY     Status: Abnormal   Collection Time   03/30/12 11:54 AM      Component Value Range   Glucose-Capillary 114 (*) 70 - 99 mg/dL    Imaging results:  Ct Abdomen Pelvis W Contrast  03/29/2012  *RADIOLOGY REPORT*  Clinical Data: Status post fall.  History of intra-abdominal abscess secondary to the anastomotic leak.  History of colon cancer.  CT ABDOMEN AND PELVIS WITH CONTRAST  Technique:  Multidetector CT imaging of the abdomen and pelvis was performed following the standard protocol during bolus administration of intravenous contrast.  Contrast: 18mL OMNIPAQUE IOHEXOL 300 MG/ML  SOLN  Comparison: Chest and two views abdomen 02/16/2012.  Findings: The  lung bases are clear.  There is no pleural or pericardial effusion.  The patient has a midline surgical wound.  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  The patient is status post  right hemicolectomy.  There is no abscess.  There is extensive infiltration of omental fat, particularly on the left.  There is no rim enhancing fluid collection although there are some small foci of fluid density material present.  No evidence of small bowel obstruction is present.  The stomach appears normal.  A few small stones are seen layering dependently in the gallbladder but there is no evidence of cholecystitis.  The liver, adrenal glands, spleen and pancreas appear normal.  Low attenuating lesions in both kidneys are most consistent with cysts. There are lymph nodes in the porta hepatis measuring up to 1.8 cm on image 21. Foley catheter is in place urinary bladder with an air-fluid level identified.  No focal bony abnormality is seen with extensive degenerative disease present about the hips.  IMPRESSION:  1.  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 2.  Extensive infiltration of omental fat, worse on the left, could be due to infectious or inflammatory process or postoperative change.  Omental tumor implants are possible but felt somewhat less likely.  No abscess is identified. 3.  Fatty infiltration of the liver. 4.  Small lymph node in the porta hepatis is nonspecific and likely reactive.  Recommend attention on follow-up exams. 5.  Small gallstones without evidence of cholecystitis.   Original Report Authenticated By: Arvid Right. Luther Parody, M.D.    US Renal  03/30/2012  *RADIOLOGY REPORT*  Clinical Data: Acute renal failure.  RENAL/URINARY TRACT ULTRASOUND COMPLETE  Comparison:  CT scan dated 05/29/2012  Findings:  Right Kidney:  13.4 cm in length.  Several small cysts in the right kidney.  No hydronephrosis. Slightly echogenic renal parenchyma, equal to the  liver.  Left Kidney:  12.6 cm in length.  3.3 cm oblong cyst in the upper pole.  No hydronephrosis.  Bladder:  Almost empty.  IMPRESSION:  1.  No hydronephrosis.  Slight increased echogenicity of the renal parenchyma consistent with renal medical disease. 2.  Small benign-appearing cysts on both kidneys.   Original Report Authenticated By: Larey Seat, M.D.     Assessment and Plan: I agree with the formulated Assessment and Plan with the following changes:   ARF Colon Ca Omental Infiltration/Inflamation  Would- hydrate aggressively  appreciate renal /fu Will have WOC see pt, appreciate surgical f/u Start anbx for his omental inflamation?   Campbell Riches, MD 10/28/201312:54 PM

## 2012-03-30 NOTE — Progress Notes (Signed)
Subjective: Interval History: has complaints dry mouth, N.  Objective: Vital signs in last 24 hours: Temp:  [97.5 F (36.4 C)-97.7 F (36.5 C)] 97.6 F (36.4 C) (10/28 0731) Pulse Rate:  [69-97] 82  (10/28 0700) Resp:  [15-21] 15  (10/28 0700) BP: (84-134)/(60-83) 134/71 mmHg (10/28 0700) SpO2:  [98 %-100 %] 100 % (10/28 0700) Weight:  [89.4 kg (197 lb 1.5 oz)] 89.4 kg (197 lb 1.5 oz) (10/28 0200) Weight change:   Intake/Output from previous day: 10/27 0701 - 10/28 0700 In: 2675.3 [P.O.:240; I.V.:433.3; IV Piggyback:2002] Out: 1665 [Urine:290; [id number] Intake/Output this shift: Total I/O In: 120 [P.O.:120] Out: 375 [Urine:75; Stool:300]  General appearance: alert and cooperative Resp: clear to auscultation bilaterally Cardio: S1, S2 normal and systolic murmur: holosystolic 2/6, blowing at apex GI: pos bs, ostomy, midline wound Extremities: extremities normal, atraumatic, no cyanosis or edema  Lab Results:  [facility] 03/29/12 2143  WBC 14.7*  HGB 13.5  HCT 36.7*  PLT 358   BMET:  Basename 03/30/12 0450 03/29/12 2143  NA 127* 120*  K 4.5 6.5*  CL 82* 74*  CO2 18* 16*  GLUCOSE 86 158*  BUN 158* 156*  CREATININE 16.82* 17.38*  CALCIUM 8.5 9.3   No results found for this basename: PTH:2 in the last 72 hours Iron Studies: No results found for this basename: IRON,TIBC,TRANSFERRIN,FERRITIN in the last 72 hours  Studies/Results: Ct Abdomen Pelvis W Contrast  03/29/2012  *RADIOLOGY REPORT*  Clinical Data: Status post fall.  History of intra-abdominal abscess secondary to the anastomotic leak.  History of colon cancer.  CT ABDOMEN AND PELVIS WITH CONTRAST  Technique:  Multidetector CT imaging of the abdomen and pelvis was performed following the standard protocol during bolus administration of intravenous contrast.  Contrast: 186mL OMNIPAQUE IOHEXOL 300 MG/ML  SOLN  Comparison: Chest and two views abdomen 02/16/2012.  Findings: The lung bases are clear.  There is no  pleural or pericardial effusion.  The patient has a midline surgical wound.  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  The patient is status post right hemicolectomy.  There is no abscess.  There is extensive infiltration of omental fat, particularly on the left.  There is no rim enhancing fluid collection although there are some small foci of fluid density material present.  No evidence of small bowel obstruction is present.  The stomach appears normal.  A few small stones are seen layering dependently in the gallbladder but there is no evidence of cholecystitis.  The liver, adrenal glands, spleen and pancreas appear normal.  Low attenuating lesions in both kidneys are most consistent with cysts. There are lymph nodes in the porta hepatis measuring up to 1.8 cm on image 21. Foley catheter is in place urinary bladder with an air-fluid level identified.  No focal bony abnormality is seen with extensive degenerative disease present about the hips.  IMPRESSION:  1.  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 2.  Extensive infiltration of omental fat, worse on the left, could be due to infectious or inflammatory process or postoperative change.  Omental tumor implants are possible but felt somewhat less likely.  No abscess is identified. 3.  Fatty infiltration of the liver. 4.  Small lymph node in the porta hepatis is nonspecific and likely reactive.  Recommend attention on follow-up exams. 5.  Small gallstones without evidence of cholecystitis.   Original Report Authenticated By: Arvid Right. Luther Parody, M.D.    US Renal  03/30/2012  *  RADIOLOGY REPORT*  Clinical Data: Acute renal failure.  RENAL/URINARY TRACT ULTRASOUND COMPLETE  Comparison:  CT scan dated 05/29/2012  Findings:  Right Kidney:  13.4 cm in length.  Several small cysts in the right kidney.  No hydronephrosis. Slightly echogenic renal parenchyma, equal to the liver.  Left Kidney:  12.6 cm in  length.  3.3 cm oblong cyst in the upper pole.  No hydronephrosis.  Bladder:  Almost empty.  IMPRESSION:  1.  No hydronephrosis.  Slight increased echogenicity of the renal parenchyma consistent with renal medical disease. 2.  Small benign-appearing cysts on both kidneys.   Original Report Authenticated By: Larey Seat, M.D.     I have reviewed the patient's current medications.  Assessment/Plan: 1 AKI has made some urine, no recovery, mod acidemia, K ok. Will follow closely another 24 hours and if not better HD in am. Vol depletion and contrast 2 anemia 4 Hx dehisc and abscess  P ivf, hco3, follow Cr.    LOS: 1 day   Kynzie Polgar L 03/30/2012,11:52 AM

## 2012-03-30 NOTE — H&P (Signed)
Hospital Admission Note Date: 03/30/2012  Patient name: [name] Medical record number: [id number] Date of birth: 1950/12/03 Age: 61 y.o. Gender: male PCP: [name], MD  Internal Medicine Teaching Service  Attending physician:  Dr. Johnnye Sima     1st Contact: MS-4 [place]/Dr. Eulas Post  [phone] / [phone] 2nd Contact: Dr. Amada Jupiter  Pager:[phone]  After 5 pm or weekends: 1st Contact: Pager: [phone] 2nd Contact: Pager: [phone]   Chief Complaint: vomiting, decreased urine production  History of Present Illness:  [name] is a 61 yo gentleman with a history of type 2 diabetes on insulin, invasive adenocarcinoma of colon  s/p right hemicolectomy complicated by postop intra-abdominal abscess with a leaking anastamosis, OSA, hypertension, currently in a SNF, who presents with nausea, vomiting, decreased urination, and weight loss. He states that over the past 3-4 weeks, he has had decreased PO intake and weakness. He has been unable to keep most fluids or solid foods down over the past 1-2 weeks. He has vomited approximately 2-3 times per day during this time with increased ileostomy output. He has not urinated very much for the past 2-3 weeks, but almost none over the past several days. He states his ileostomy bag is changed 1-3 times per day. His vomit usually smells like stool. He denies fever but he has had some chills, c/o thirst, and has had some incisional pain in his abdomen but nothing new or severe.  Denies chest pain, shortness of breath, increased swelling in his legs.  Meds:   Medication List     As of 03/30/2012  2:55 AM    ASK your doctor about these medications         atorvastatin 20 MG tablet   Commonly known as: LIPITOR   Take 20 mg by mouth at bedtime.      carvedilol 6.25 MG tablet   Commonly known as: COREG   Take 1 tablet (6.25 mg total) by mouth 2 (two) times daily with a meal.      insulin NPH-insulin regular (70-30) 100 UNIT/ML  injection   Commonly known as: NOVOLIN 70/30   Inject 10-30 Units into the skin 2 (two) times daily with a meal. Inject 30 units in the morning and 10 units at bedtime.      loperamide 2 MG capsule   Commonly known as: IMODIUM   Take 1 capsule (2 mg total) by mouth 4 (four) times daily as needed for diarrhea or loose stools (for very loose ileostomy output; output should be thicker (pancake batter consistency) not watery).      metFORMIN 500 MG tablet   Commonly known as: GLUCOPHAGE   Take 1,000 mg by mouth 2 (two) times daily with a meal.         Allergies: Allergies as of 03/29/2012  . (No Known Allergies)   Past Medical History  Diagnosis Date  . Hyperlipidemia   . Hypertension   . Heart murmur   . Diabetes mellitus     type 2 IDDM x 15 years  . Sleep apnea     does not wear CPAP now  . Colon polyp   . Arthritis     left knee  . 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 Colonoscopy   . Ganglion cyst excision 20 YRS AGO    RT ARM  . Partial colectomy 02/06/2012  . Partial colectomy 02/06/2012    Procedure: PARTIAL COLECTOMY;  Surgeon: Imogene Burn.  [name], MD;  Location: [place];  Service: General;  Laterality: N/A;  right partial colectomy  . Colonoscopy 02/06/2012    Procedure: COLONOSCOPY;  Surgeon: Milus Banister, MD;  Location: [place];  Service: Endoscopy;;  . Laparotomy 02/16/2012    Procedure: EXPLORATORY LAPAROTOMY;  Surgeon: Zenovia Jarred, MD;  Location: [place];  Service: General;  Laterality: N/A;  Exploratory Laparotomy with resection of anastomosis  . Ileostomy 02/16/2012    Procedure: ILEOSTOMY;  Surgeon: Zenovia Jarred, MD;  Location: [place];  Service: General;  Laterality: N/A;  . Application of wound vac 02/16/2012    Procedure: APPLICATION OF WOUND VAC;  Surgeon: Zenovia Jarred, MD;  Location: [place];  Service: General;  Laterality: N/A;  . Laparotomy 02/18/2012    Procedure: EXPLORATORY LAPAROTOMY;   Surgeon: Odis Hollingshead, MD;  Location: [place];  Service: General;  Laterality: N/A;  exploratory laparotomy  change of abdominal vac dressing  . Dressing change under anesthesia 02/18/2012    Procedure: DRESSING CHANGE UNDER ANESTHESIA;  Surgeon: Odis Hollingshead, MD;  Location: [place];  Service: General;  Laterality: N/A;  . Laparotomy 02/20/2012    Procedure: EXPLORATORY LAPAROTOMY;  Surgeon: Odis Hollingshead, MD;  Location: [place];  Service: General;  Laterality: N/A;  . Vacuum assisted closure change 02/20/2012    Procedure: ABDOMINAL VACUUM ASSISTED CLOSURE CHANGE;  Surgeon: Odis Hollingshead, MD;  Location: [place];  Service: General;;  . Laparotomy 02/23/2012    Procedure: EXPLORATORY LAPAROTOMY;  Surgeon: Joyice Faster. Cornett, MD;  Location: [place];  Service: General;  Laterality: N/A;  Irrigation and Debridement of abdominal wound with wound vac change with partial closure  . Incision and drainage of wound 02/23/2012    Procedure: IRRIGATION AND DEBRIDEMENT WOUND;  Surgeon: Joyice Faster. Cornett, MD;  Location: [place];  Service: General;  Laterality: N/A;  . Vacuum assisted closure change 02/23/2012    Procedure: ABDOMINAL VACUUM ASSISTED CLOSURE CHANGE;  Surgeon: Joyice Faster. Cornett, MD;  Location: [place];  Service: General;  Laterality: N/A;  . Laparotomy 02/26/2012    Procedure: EXPLORATORY LAPAROTOMY;  Surgeon: Imogene Burn. [name], MD;  Location: [place];  Service: General;  Laterality: N/A;     . Application of wound vac 02/26/2012    Procedure: APPLICATION OF WOUND VAC;  Surgeon: Imogene Burn. [name], MD;  Location: [place];  Service: General;  Laterality: N/A;   Family History  Problem Relation Age of Onset  . Diabetes Father   . Heart disease Father   . Colon cancer Neg Hx   . Diabetes Sister   . Heart disease Sister   . Heart disease Mother    History   Social History  . Marital Status: Divorced    Spouse Name: N/A    Number of Children: N/A  . Years of Education: N/A   Occupational History    . Not on file.   Social History Main Topics  . Smoking status: Current Every Day Smoker -- 0.5 packs/day for 30 years    Types: Cigarettes  . Smokeless tobacco: Never Used   Comment: faxing referral to quitline when signed by patient  . Alcohol Use: Yes     LIQUOR OR WINE WEEKLY  . Drug Use: No  . Sexually Active: Not Currently   Other Topics Concern  . Not on file   Social History Narrative   Financial assistance approved for 100% discount at [facility] and has [place]  August 08, 2009 5:48 PM*PATIENT WAS GIVEN DM CARD.Lela Sturdivant NT II  June 08, 2010 3:56 PM    Review of Systems: Pertinent items noted in HPI   Physical Exam Blood pressure 123/83, pulse 88, temperature 97.6 F (36.4 C), temperature source Oral, resp. rate 18, SpO2 98.00%. on RA General:  No acute distress, alert and oriented x 3  HEENT:  PERRL, EOMI, dry mucous membranes, cracked dry lips Cardiovascular:  Regular rate and rhythm, 99991111 systolic murmur  Respiratory:  Clear to auscultation bilaterally, no wheezes, rales, or rhonchi Abdomen:  Soft, no rebound or guarding, right lower quadrant ostomy in place, bag with green-yellow clear liquid, bandage over  midline wound (recently changed and examined by MD so did not remove)  Extremities:  Warm and well-perfused,  trace LE edema  Skin: Warm, dry, no rashes Neuro:  alert and oriented  Lab results: Basic Metabolic Panel:  Basename 03/29/12 2143  NA 120*  K 6.5*  CL 74*  CO2 16*  GLUCOSE 158*  BUN 156*  CREATININE 17.38*  CALCIUM 9.3  MG --  PHOS --   Liver Function Tests:  [facility] 03/29/12 2143  AST 33  ALT 23  ALKPHOS 130*  BILITOT 1.0  PROT 9.7*  ALBUMIN 3.4*    Basename 03/29/12 2143  LIPASE 143*  AMYLASE --   CBC:  Basename 03/29/12 2143  WBC 14.7*  NEUTROABS 10.1*  HGB 13.5  HCT 36.7*  MCV 68.3*  PLT 358   + rouleaux formation Atypical lymphocytes  CBG:  Basename 03/30/12 0202  GLUCAP 92    Urinalysis:  Basename 03/29/12 1941  COLORURINE AMBER*  LABSPEC 1.021  PHURINE 5.0  GLUCOSEU NEGATIVE  HGBUR TRACE*  BILIRUBINUR SMALL*  KETONESUR TRACE*  PROTEINUR 30*  UROBILINOGEN 0.2  NITRITE NEGATIVE  LEUKOCYTESUR NEGATIVE  +amorphous urates/phosphates - bacteria, leukocytes   Imaging results:  Ct Abdomen Pelvis W Contrast  03/29/2012  *RADIOLOGY REPORT*  Clinical Data: Status post fall.  History of intra-abdominal abscess secondary to the anastomotic leak.  History of colon cancer.  CT ABDOMEN AND PELVIS WITH CONTRAST  Technique:  Multidetector CT imaging of the abdomen and pelvis was performed following the standard protocol during bolus administration of intravenous contrast.  Contrast: 114mL OMNIPAQUE IOHEXOL 300 MG/ML  SOLN  Comparison: Chest and two views abdomen 02/16/2012.  Findings: The lung bases are clear.  There is no pleural or pericardial effusion.  The patient has a midline surgical wound.  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  The patient is status post right hemicolectomy.  There is no abscess.  There is extensive infiltration of omental fat, particularly on the left.  There is no rim enhancing fluid collection although there are some small foci of fluid density material present.  No evidence of small bowel obstruction is present.  The stomach appears normal.  A few small stones are seen layering dependently in the gallbladder but there is no evidence of cholecystitis.  The liver, adrenal glands, spleen and pancreas appear normal.  Low attenuating lesions in both kidneys are most consistent with cysts. There are lymph nodes in the porta hepatis measuring up to 1.8 cm on image 21. Foley catheter is in place urinary bladder with an air-fluid level identified.  No focal bony abnormality is seen with extensive degenerative disease present about the hips.  IMPRESSION:  1.  Status post right hemicolectomy with a right lower quadrant ileostomy and long Hartmann's  pouch. 2.  Extensive infiltration of omental fat, worse on  the left, could be due to infectious or inflammatory process or postoperative change.  Omental tumor implants are possible but felt somewhat less likely.  No abscess is identified. 3.  Fatty infiltration of the liver. 4.  Small lymph node in the porta hepatis is nonspecific and likely reactive.  Recommend attention on follow-up exams. 5.  Small gallstones without evidence of cholecystitis.   Original Report Authenticated By: Arvid Right. Luther Parody, M.D.     Other results: EKG: Peaked T waves on admission EKG, no ST elevations or depressions, rate 77  Assessment & Plan by Problem: Principal Problem:  *Acute renal failure Active Problems:  Type II diabetes mellitus, well controlled  Hyperlipidemia LDL goal <70  TOBACCO ABUSE  H/O colonoscopy with polypectomy  Hyperkalemia  Protein-calorie malnutrition, severe  Hyponatremia  Leukocytosis  [name] is a 61 yo gentleman with a history of type 2 diabetes on insulin, invasive adenocarcinoma of colon  s/p right hemicolectomy complicated by postop intra-abdominal abscess with a leaking anastamosis, OSA, hypertension, currently in a SNF, who presents with nausea, vomiting, decreased urination, and weight loss.  Acute renal failure - Creatinine 17.38, BUN 156 (baseline Cr 0.9-1) Likely secondary to extreme volume depletion in the setting of nausea, vomiting, increased ileostomy output. On 02/05/12 he weighed 256 lbs, on admission today, his weight was 197. Appears volume depleted on exam. Recent IV contrast used for abdominal CT. Despite elevated BUN, patient is alert and oriented. No indications for urgent dialysis. Anion gap is 30, metabolic acidosis likely due to renal failure.  -renal following -aggressive rehydration with NS at 250cc/hr, NaHCO3 (already received 3L NS bolus) -strict I and O's, daily weights -FENA pending -zofran PRN -renal ultrasound -follow BMP  Hyperkalemia K was  6.5 on admission with slightly peaked T waves on admission EKG. Given insulin, ca gluconate, and 45 mg Kayexalate the ED and repeat EKG showed resolution of the peaked T. waves at 3 AM. Likely 2/2 ARF.  -repeat BMP in AM  Colon adenocarcinoma S/p right hemicolectomy and ileostomy creation Elevated WBC to 14.7, afebrile, hx of abdominal abscess 2/2 leaking anastamosis. Wound vac in place. CT scan abd/pelvis with inflammation of omental fat, no focal abscess noted, no SBO. Pt with abdominal pain around incision, no rebound or guarding. Ileostomy site appears clear, +output. Does not appear septic.  -will consult surgery in AM -ileostomy care -follow CBC, vitals  Diabetes -hold metformin as pt with metabolic acidosis and renal failure -lantus 10u daily with correction scale -CBG q4  HTN Stable, BP 107-123/62-83 -cont home coreg  PPX -heparin [place]  Diet -NPO except sips with meds   .   Signed: Santa Lighter 03/30/2012, 2:55 AM

## 2012-03-30 NOTE — Progress Notes (Signed)
Utilization Review Completed.  

## 2012-03-30 NOTE — Progress Notes (Signed)
Resident Co-sign Daily Note: I have seen the patient and reviewed the daily progress note by Elmer Sow, MS-III and discussed the care of the patient with him.  See below for documentation of my findings, assessment, and plans.  Subjective: Pt c/o being thirsty. C/o abdominal pain at incision site. Denies any further N/V. Some uop with foley catheter in place.  Objective: Vital signs in last 24 hours: Filed Vitals:   03/30/12 0700 03/30/12 0731 03/30/12 1152 03/30/12 1517  BP: 134/71  100/59   Pulse: 82  73   Temp:  97.6 F (36.4 C) 97.6 F (36.4 C) 97.8 F (36.6 C)  TempSrc:  Oral Oral Oral  Resp: 15  16   Height:      Weight:      SpO2: 100%  100%    Physical Exam: Vitals reviewed. General: Resting in bed, NAD HEENT: PERRL, EOMI, no scleral icterus Cardiac: RRR, no rubs, murmurs or gallops Pulm: Clear to auscultation bilaterally, no wheezes, rales, or rhonchi Abd: Soft, mildly TTP around incision site w/o erythema, mild purulence in inferior portion of wound, nondistended, BS present Ext: 2+ pulses. Warm and well perfused, no pedal edema Neuro: Alert and oriented X3, cranial nerves II-XII grossly intact, strength and sensation to light touch equal in bilateral upper and lower extremities  Lab Results: Reviewed and documented in Electronic Record Micro Results: Reviewed and documented in Electronic Record Studies/Results: Reviewed and documented in Electronic Record Medications: I have reviewed the patient's current medications. Scheduled Meds:   . sodium chloride  1,000 mL Intravenous Once  . albuterol  5 mg Nebulization Once  . antiseptic oral rinse  15 mL Mouth Rinse BID  . atorvastatin  20 mg Oral QHS  . calcium gluconate 1 GM IV  1 g Intravenous Once  . dextrose  1 ampule Intravenous Once  . feeding supplement  1 Container Oral TID BM  . heparin  5,000 Units Subcutaneous Q8H  . HYDROmorphone  1 mg Intravenous Once  . insulin aspart      . insulin aspart  0-9  Units Subcutaneous Q4H  . insulin aspart  10 Units Intravenous Once  . insulin glargine  10 Units Subcutaneous QHS  . ondansetron      . ondansetron  4 mg Intravenous Once  . sodium bicarbonate  50 mEq Intravenous Once  . sodium chloride  1,000 mL Intravenous Once  . sodium chloride  2,000 mL Intravenous Once  . sodium chloride  3 mL Intravenous Q12H  . sodium polystyrene  45 g Oral Once  . white petrolatum      . DISCONTD: carvedilol  6.25 mg Oral BID WC   Continuous Infusions:   .  sodium bicarbonate infusion sterile water 1000 mL 250 mL/hr at 03/30/12 0416   Followed by  .  sodium bicarbonate infusion sterile water 1000 mL 150 mL/hr at 03/30/12 1200  . DISCONTD: sodium chloride 1,000 mL (03/30/12 0029)  . DISCONTD:  sodium bicarbonate infusion sterile water 1000 mL     PRN Meds:.iohexol, morphine injection, ondansetron (ZOFRAN) IV, ondansetron Assessment/Plan:  1. ARF: Creatinine 18 on admission, and pt reported no uop in at least 4 days. He has had increased ostomy output and emesis over the past few days. Unfortunately a CT abd/pelvis with contrast was obtained in the ED, which likely worsened his renal failure. Renal was consulted and pt aggressively hydrated with bicarb in sterile water per Nephrology. Cr 16.82 this morning, and pt making some urine. Nephrology continues  to follow, and will determine if he needs HD in the am. If so, they will place a temporary HD cath tomorrow. Will continue IVF per Neph recs. - q12 BMPs - F/u w/ Neph in am - Monitor uop  2. Colon adenocarcinoma s/p right hemicolectomy: Pt with unfortunate anastomotic leak with intraabdominal abscess formation s/p right hemicolectomy in September. He was taken back to the OR for multiple washouts, and his abdomen was closed on 02/26/12. He had a wound vac in place, which was removed at d/c on 03/10/12. The lower half of the wound healed, but the upper half is still healing and is open superficially with visible  fascial sutures. Fibrinous material is present in the wound on admission to the hospital for ARF. Possible some mild purulence in the inferior portion of the wound. General Surgery saw the pt and cleaned the wound- at this time they did not feel that the pt requires abx. Wound Care is also on board and is following.  3. Hyperkalemia: Potassium 6.5 on admission, but pt very dehydrated. After IVF started, am BMP with K+ 4.5, and later 4.7. Will continue to hydrate and monitor potassium levels. - q12h BMP   LOS: 1 day   Otho Bellows 03/30/2012, 5:05 PM

## 2012-03-30 NOTE — Consult Note (Signed)
Gene A Beverely Low 03/30/2012 Hero Kulish D Requesting Physician:  Dr. Obie Dredge  Reason for Consult:  Acute renal failure and hyperkalemia HPI: The patient is a 61 y.o. year-old patient w hx of DM2 on insulin/metformin, HTN and recent surgery in September for colon mass with dysplastic changes on biopsy. The initial colonoscopy was done by [name].  Patient was found to have a 3-4 cm sessile mass in the distal transverse colon. This was biopsied and the biopsy showed high-grade dysplasia with no sign of malignancy. The patient was admitted and underwent open R hemicolectomy on 9/15 which was done with assisted colonoscopy due to inability to locate the polyp/tattoo. The patient recovered and was discharged. He was readmitted 9/15 with anastomotic leak and intra-abdominal abcess and underwent exlap with washout, resection of anastomosis with end ileostomy and creation of a long Hartman's pouch. The abdomen was left open, multiple further washouts were done, wound VAC was used, and then abdomen was closed on 9/25. Due to prolonged hospital stay, pt was debilitated and was D/C'd to SNF on 10/8 for further rehab. Creat was 0.99 around time of discharge.   Patient sent to ED today from SNF for recurrent nausea and vomiting. Appetite has been poor. He says he has not urinated for "a while". He was sent for an abdominal CT which did not show any abcess or perforation. It did show extensive infiltration of omental fat due to infection, inflammation or post-op change. He unfortunately received IV contrast with the CT scan tonight. Labs returned with BUN 156, creat 17, HCO3 16 and K 6.5.  EKG with peaked T's, no QRS widening. He has been treated with IV Ca, insulin and glucose. He is getting po Kayexalate also.  Patient is alert and fully oriented. Denies NSAID use. +thirst, no high ostomy output, poor appetite, vomiting for 1-2 weeks. +abd pain near area of incision. No sob, cp, f/c/s. He is on coreg only for  BP, no acei or arb.   ROS  no ha, visual change or confusion  no focal weakness  no cough  no joint pain  no rash or itching  Past Medical History:  Past Medical History  Diagnosis Date  . Hyperlipidemia   . Hypertension   . Heart murmur   . Diabetes mellitus     type 2 IDDM x 15 years  . Sleep apnea     does not wear CPAP now  . Colon polyp   . Arthritis     left knee  . 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 12/25/2011    Past Surgical History:  Past Surgical History  Procedure Date  . Colonoscopy   . Ganglion cyst excision 20 YRS AGO    RT ARM  . Partial colectomy 02/06/2012  . Partial colectomy 02/06/2012    Procedure: PARTIAL COLECTOMY;  Surgeon: Imogene Burn. [name], MD;  Location: [place];  Service: General;  Laterality: N/A;  right partial colectomy  . Colonoscopy 02/06/2012    Procedure: COLONOSCOPY;  Surgeon: Milus Banister, MD;  Location: [place];  Service: Endoscopy;;  . Laparotomy 02/16/2012    Procedure: EXPLORATORY LAPAROTOMY;  Surgeon: Zenovia Jarred, MD;  Location: [place];  Service: General;  Laterality: N/A;  Exploratory Laparotomy with resection of anastomosis  . Ileostomy 02/16/2012    Procedure: ILEOSTOMY;  Surgeon: Zenovia Jarred, MD;  Location: [place];  Service: General;  Laterality: N/A;  . Application of wound vac 02/16/2012    Procedure: APPLICATION OF WOUND VAC;  Surgeon: Zenovia Jarred, MD;  Location: [place];  Service: General;  Laterality: N/A;  . Laparotomy 02/18/2012    Procedure: EXPLORATORY LAPAROTOMY;  Surgeon: Odis Hollingshead, MD;  Location: [place];  Service: General;  Laterality: N/A;  exploratory laparotomy  change of abdominal vac dressing  . Dressing change under anesthesia 02/18/2012    Procedure: DRESSING CHANGE UNDER ANESTHESIA;  Surgeon: Odis Hollingshead, MD;  Location: [place];  Service: General;  Laterality: N/A;  . Laparotomy 02/20/2012    Procedure: EXPLORATORY LAPAROTOMY;  Surgeon: Odis Hollingshead, MD;  Location: [place];   Service: General;  Laterality: N/A;  . Vacuum assisted closure change 02/20/2012    Procedure: ABDOMINAL VACUUM ASSISTED CLOSURE CHANGE;  Surgeon: Odis Hollingshead, MD;  Location: [place];  Service: General;;  . Laparotomy 02/23/2012    Procedure: EXPLORATORY LAPAROTOMY;  Surgeon: Joyice Faster. Cornett, MD;  Location: [place];  Service: General;  Laterality: N/A;  Irrigation and Debridement of abdominal wound with wound vac change with partial closure  . Incision and drainage of wound 02/23/2012    Procedure: IRRIGATION AND DEBRIDEMENT WOUND;  Surgeon: Joyice Faster. Cornett, MD;  Location: [place];  Service: General;  Laterality: N/A;  . Vacuum assisted closure change 02/23/2012    Procedure: ABDOMINAL VACUUM ASSISTED CLOSURE CHANGE;  Surgeon: Joyice Faster. Cornett, MD;  Location: [place];  Service: General;  Laterality: N/A;  . Laparotomy 02/26/2012    Procedure: EXPLORATORY LAPAROTOMY;  Surgeon: Imogene Burn. [name], MD;  Location: [place];  Service: General;  Laterality: N/A;     . Application of wound vac 02/26/2012    Procedure: APPLICATION OF WOUND VAC;  Surgeon: Imogene Burn. [name], MD;  Location: [place];  Service: General;  Laterality: N/A;    Family History:  Family History  Problem Relation Age of Onset  . Diabetes Father   . Heart disease Father   . Colon cancer Neg Hx   . Diabetes Sister   . Heart disease Sister   . Heart disease Mother    Social History:  reports that he has been smoking Cigarettes.  He has a 15 pack-year smoking history. He has never used smokeless tobacco. He reports that he drinks alcohol. He reports that he does not use illicit drugs.  Allergies: No Known Allergies  Home medications: Prior to Admission medications   Medication Sig Start Date End Date Taking? Authorizing Provider  atorvastatin (LIPITOR) 20 MG tablet Take 20 mg by mouth at bedtime.   Yes Historical Provider, MD  carvedilol (COREG) 6.25 MG tablet Take 1 tablet (6.25 mg total) by mouth 2 (two) times daily with a meal.  02/13/12 02/12/13 Yes Matthew K. Tsuei, MD  insulin NPH-insulin regular (NOVOLIN 70/30) (70-30) 100 UNIT/ML injection Inject 10-30 Units into the skin 2 (two) times daily with a meal. Inject 30 units in the morning and 10 units at bedtime. 03/12/12  Yes [name], MD  loperamide (IMODIUM) 2 MG capsule Take 1 capsule (2 mg total) by mouth 4 (four) times daily as needed for diarrhea or loose stools (for very loose ileostomy output; output should be thicker (pancake batter consistency) not watery). 03/10/12  Yes Imogene Burn. Tsuei, MD  metFORMIN (GLUCOPHAGE) 500 MG tablet Take 1,000 mg by mouth 2 (two) times daily with a meal.   Yes Historical Provider, MD    Inpatient medications:    . albuterol  5 mg Nebulization Once  . dextrose  1 ampule Intravenous Once  . HYDROmorphone  1  mg Intravenous Once  . insulin aspart      . insulin aspart  10 Units Intravenous Once  . ondansetron  4 mg Intravenous Once  . sodium bicarbonate  50 mEq Intravenous Once  . sodium polystyrene  45 g Oral Once    Labs: Basic Metabolic Panel:  Lab [id number] 2143  NA 120*  K 6.5*  CL 74*  CO2 16*  GLUCOSE 158*  BUN 156*  CREATININE 17.38*  ALB --  CALCIUM 9.3  PHOS --   Liver Function Tests:  Lab 03/29/12 2143  AST 33  ALT 23  ALKPHOS 130*  BILITOT 1.0  PROT 9.7*  ALBUMIN 3.4*    Lab 03/29/12 2143  LIPASE 143*  AMYLASE --   No results found for this basename: AMMONIA:3 in the last 168 hours CBC:  Lab 03/29/12 2143  WBC 14.7*  NEUTROABS 10.1*  HGB 13.5  HCT 36.7*  MCV 68.3*  PLT 358   PT/INR: @labrcntip (inr:5) Cardiac Enzymes: No results found for this basename: CKTOTAL:5,CKMB:5,CKMBINDEX:5,TROPONINI:5 in the last 168 hours CBG: No results found for this basename: GLUCAP:5 in the last 168 hours  Iron Studies: No results found for this basename: IRON:30,TIBC:30,TRANSFERRIN:30,FERRITIN:30 in the last 168 hours  Xrays/Other Studies: Ct Abdomen Pelvis W Contrast  03/29/2012   *RADIOLOGY REPORT*  Clinical Data: Status post fall.  History of intra-abdominal abscess secondary to the anastomotic leak.  History of colon cancer.  CT ABDOMEN AND PELVIS WITH CONTRAST  Technique:  Multidetector CT imaging of the abdomen and pelvis was performed following the standard protocol during bolus administration of intravenous contrast.  Contrast: 13mL OMNIPAQUE IOHEXOL 300 MG/ML  SOLN  Comparison: Chest and two views abdomen 02/16/2012.  Findings: The lung bases are clear.  There is no pleural or pericardial effusion.  The patient has a midline surgical wound.  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  The patient is status post right hemicolectomy.  There is no abscess.  There is extensive infiltration of omental fat, particularly on the left.  There is no rim enhancing fluid collection although there are some small foci of fluid density material present.  No evidence of small bowel obstruction is present.  The stomach appears normal.  A few small stones are seen layering dependently in the gallbladder but there is no evidence of cholecystitis.  The liver, adrenal glands, spleen and pancreas appear normal.  Low attenuating lesions in both kidneys are most consistent with cysts. There are lymph nodes in the porta hepatis measuring up to 1.8 cm on image 21. Foley catheter is in place urinary bladder with an air-fluid level identified.  No focal bony abnormality is seen with extensive degenerative disease present about the hips.  IMPRESSION:  1.  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 2.  Extensive infiltration of omental fat, worse on the left, could be due to infectious or inflammatory process or postoperative change.  Omental tumor implants are possible but felt somewhat less likely.  No abscess is identified. 3.  Fatty infiltration of the liver. 4.  Small lymph node in the porta hepatis is nonspecific and likely reactive.  Recommend  attention on follow-up exams. 5.  Small gallstones without evidence of cholecystitis.   Original Report Authenticated By: Arvid Right. Luther Parody, M.D.     Physical Exam:  Blood pressure 123/83, pulse 88, temperature 97.6 F (36.4 C), temperature source Oral, resp. rate 18, SpO2 98.00%.  Gen: alert, lying flat, no distress, responds  appropriately Skin: no rash, cyanosis HEENT:  EOMI, sclera anicteric, throat dry Neck: flat neck veins, no JVD, no bruits or LAN Chest: cleart bilat, no rales or wheezing Heart: regular, no rub or gallop, no murmur Abdomen: soft, obese, nontender, RLQ ostomy bag with brown-greenish material small amts. Midline wound examined- lower half mostly closed, upper half open3cm wide by 6 cm long with pooling of purulent tan liquid material, visible blue sutures from prior sutures. No odor.  Ext: no LE edema, decreased skin turgor Neuro: alert, Ox3, no focal deficit, good strength in all ext, no asterixis   Impression/Plan 1. Acute renal failure, likely due to hypotension and volume depletion in setting of N/V and recent abd surgery with complications. Marked vol depletion on exam. No ACEI, NSAiD's or other nephrotoxins noted. However, tonight he was given IV contrast 100cc for an abdominal CT which may have nephrotoxic consequences. He is making urine. Recommend aggressive volume repletion with NS and NaHCO3, see orders. I think if it weren't for IV contrast that recovery of renal function would be prompt with volume repletion, but not sure how contrast will affect recovery. Mentation is excellent, would hold off on dialysis for now and treat medically.  Foley in place and making urine.  2. Hyperkalemia- agree with Kayexalate 45 gm. Had peaked T's but no QRS widening on ekg. Repeat K soon.  3. R hemicolectomy 9/15 for colon Ca- was readmitted 9/15 with anastomotic leak, intra-abdominal abcess and underwent washout with resection of anastomosis with end ileostomy and creation of  long Hartman's pouch. Abdomen was left open, had several further washouts and then abdomen was closed on 9/25. D/C'd to SNF on 10/8 for further rehab due to debility. Today there is abundant purulent looking material pooling in the open upper aspect of the abdominal wound. Also + CT changes in the omentum of uncertain significance. WBC up, no fever, +abd pain; ?wound infection. 4. Hx of DM 2- was on metformin and 70/30 insulin at SNF 5. HTN- coreg 6. HL- on lipitor   Kelly Splinter  MD [place] [facility] pgr    [phone] cell 03/30/2012, 1:33 AM

## 2012-03-30 NOTE — Consult Note (Signed)
WOC ostomy consult  Consult requested for abd wound and ileostomy prior to CCS involvement. Dr [name] in to assess abd wound and provide further plan of car, will not follow for this area. Stoma type/location: Ileostomy stoma from previous admission last month. Stomal assessment/size: Stoma red and viable, flush with skin level, 11/2 inches and oval.  Peristomal assessment: Denuded skin surrounding.  Pt states pouch leaks frequently when he is at the SNF. Treatment options for stomal/peristomal skin: Will add barrier ring to flexible pouch to assist with seal. Output 50cc yellow liquid with 1 formed mass of brown stool. Ostomy pouching: 1pc Education provided: Demonstrated application of one piece flexible pouch with barrier ring added to prolong seal.  Pt able to open and close velcro.  States he has total assistance at SNF with pouching activities but is knowledgeable regarding emptying routines and application. Supplies ordered to room for bedside nurse.   Will not plan to follow further unless re-consulted.  [address], [place], MSN, [place]

## 2012-03-30 NOTE — Evaluation (Signed)
Occupational Therapy Evaluation Patient Details Name: [name] MRN: [id number] DOB: [birth year] Today's Date: 03/30/2012 Time: [id number] OT Time Calculation (min): 21 min  OT Assessment / Plan / Recommendation Clinical Impression  Pt is a 61y/o male with a h/o colectomy due to colon Ca (02-06-12) who presents with N/V and decreased urine output. Pt had d/c'd to SNF after colectomy where he was receiving PT until he became ill and admitted to [facility]. Pt will benefit from skilled OT in the acute setting to maximize I with ADL and ADL mobility prior to d/c.     OT Assessment  Patient needs continued OT Services    Follow Up Recommendations  Skilled nursing facility    Barriers to Discharge      Equipment Recommendations  3 in 1 bedside comode    Recommendations for Other Services    Frequency  Min 2X/week    Precautions / Restrictions Precautions Precautions: Fall Restrictions Weight Bearing Restrictions: No   Pertinent Vitals/Pain Pt reports abdominal pain- did not rate. RN aware.    ADL  Grooming: Wash/dry face;Min guard Where Assessed - Grooming: Unsupported sitting Upper Body Dressing: Simulated;Moderate assistance Where Assessed - Upper Body Dressing: Supported sitting Lower Body Dressing: Maximal assistance Where Assessed - Lower Body Dressing: Supported sit to Lobbyist: Simulated;Moderate assistance Toilet Transfer Method: Sit to stand Transfers/Ambulation Related to ADLs: pt deferred OOB activites as he was not feeling well upon sitting EOB ADL Comments: pt was clearly orthostatic with nursing earlier in the morning; he exhibited symptoms of orthostatic hypotension upon sitting to EOB but BP only dropped slightly, still pt unable to tolerate dizziness and was laid back down after ~96min     OT Diagnosis: Generalized weakness;Acute pain  OT Problem List: Decreased strength;Decreased activity tolerance;Impaired balance (sitting and/or standing);Decreased  knowledge of use of DME or AE;Decreased knowledge of precautions;Cardiopulmonary status limiting activity;Pain OT Treatment Interventions: Self-care/ADL training;DME and/or AE instruction;Therapeutic activities;Patient/family education;Balance training   OT Goals Acute Rehab OT Goals OT Goal Formulation: With patient Time For Goal Achievement: 04/13/12 Potential to Achieve Goals: Good ADL Goals Pt Will Perform Grooming: with supervision;Sitting at sink;Standing at sink ADL Goal: Grooming - Progress: Goal set today Pt Will Perform Upper Body Dressing: with set-up;with supervision;Sitting, bed;Sitting, chair ADL Goal: Upper Body Dressing - Progress: Goal set today Pt Will Perform Lower Body Dressing: with min assist;Sit to stand from bed;Sit to stand from chair ADL Goal: Lower Body Dressing - Progress: Goal set today Pt Will Transfer to Toilet: with supervision;Ambulation;with DME ADL Goal: Toilet Transfer - Progress: Goal set today Pt Will Perform Toileting - Clothing Manipulation: with supervision;Standing ADL Goal: Toileting - Clothing Manipulation - Progress: Goal set today Pt Will Perform Toileting - Hygiene: with set-up;Sitting on 3-in-1 or toilet ADL Goal: Toileting - Hygiene - Progress: Goal set today Additional ADL Goal #1: Pt will participate in greater than or equal to 53min of therapeutic activity with less 3 rest breaks in prep for increased activity tolerance for ADLs. ADL Goal: Additional Goal #1 - Progress: Goal set today  Visit Information  Last OT Received On: 03/30/12 Assistance Needed: +2    Subjective Data  Subjective: I've been in the bed for the past 4 or 5 days Patient Stated Goal: Return home   Prior [place] Lives With: Alone;Daughter Available Help at Discharge: Family Type of Home: House Home Access: Stairs to enter Technical brewer of Steps: 5 Entrance Stairs-Rails: Right;Left;Can reach both [place]:  One  level Bathroom Shower/Tub: Walk-in shower;Tub/shower unit Home Adaptive Equipment: Walker - rolling Prior Function Level of Independence: Independent Able to Take Stairs?: Reciprically Driving: Yes Vocation: Part time employment Communication Communication: No difficulties Dominant Hand: Right         Vision/Perception Vision - Assessment Additional Comments: glasses are broken   Cognition  Overall Cognitive Status: Appears within functional limits for tasks assessed/performed Area of Impairment: Following commands;Safety/judgement;Problem solving Arousal/Alertness: Awake/alert Orientation Level: Appears intact for tasks assessed Behavior During Session: [facility] for tasks performed Current Attention Level: Sustained Following Commands: Follows multi-step commands inconsistently Safety/Judgement: Decreased awareness of safety precautions;Decreased safety judgement for tasks assessed    Extremity/Trunk Assessment Right Upper Extremity Assessment RUE ROM/Strength/Tone: Deficits RUE ROM/Strength/Tone Deficits: General weakness; ROM WFL Left Upper Extremity Assessment LUE ROM/Strength/Tone: Deficits LUE ROM/Strength/Tone Deficits: General weakness; ROM is WFL Right Lower Extremity Assessment RLE ROM/Strength/Tone: Deficits RLE ROM/Strength/Tone Deficits: strength>3/5 RLE Sensation: History of peripheral neuropathy Left Lower Extremity Assessment LLE ROM/Strength/Tone: Deficits LLE ROM/Strength/Tone Deficits: strength> 3/5 LLE Sensation: History of peripheral neuropathy     Mobility Bed Mobility Bed Mobility: Rolling Left;Left Sidelying to Sit;Sit to Supine Rolling Left: 4: Min guard Left Sidelying to Sit: 1: +2 Total assist;With rails Left Sidelying to Sit: Patient Percentage: 60% Sitting - Scoot to Edge of Bed: 5: Supervision Details for Bed Mobility Assistance: vc's for safe technique; truncal assist Transfers Details for Transfer Assistance: pt deferred standing due to  not feeling well and BP dropping; VC;s for lateral scooting for technique     Shoulder Instructions     Exercise     Balance Static Sitting Balance Static Sitting - Balance Support: Feet supported;Bilateral upper extremity supported Static Sitting - Level of Assistance: 5: Stand by assistance   End of Session OT - End of Session Activity Tolerance: Patient limited by pain;Patient limited by fatigue (and dizziness) Patient left: in bed;with call bell/phone within reach;with nursing in room Nurse Communication: Mobility status  GO     Johann Santone 03/30/2012, 2:15 PM

## 2012-03-31 ENCOUNTER — Inpatient Hospital Stay (HOSPITAL_COMMUNITY): Payer: Medicaid Other

## 2012-03-31 LAB — DIFFERENTIAL
Basophils Absolute: 0 10*3/uL (ref 0.0–0.1)
Basophils Relative: 0 % (ref 0–1)
Eosinophils Absolute: 0 10*3/uL (ref 0.0–0.7)
Eosinophils Relative: 0 % (ref 0–5)
Lymphocytes Relative: 39 % (ref 12–46)
Lymphs Abs: 3.9 10*3/uL (ref 0.7–4.0)
Monocytes Absolute: 0.8 10*3/uL (ref 0.1–1.0)
Monocytes Relative: 8 % (ref 3–12)
Neutro Abs: 5.2 10*3/uL (ref 1.7–7.7)
Neutrophils Relative %: 53 % (ref 43–77)

## 2012-03-31 LAB — URINE CULTURE
Colony Count: NO GROWTH
Culture: NO GROWTH

## 2012-03-31 LAB — COMPREHENSIVE METABOLIC PANEL
ALT: 13 U/L (ref 0–53)
AST: 24 U/L (ref 0–37)
Albumin: 2.3 g/dL — ABNORMAL LOW (ref 3.5–5.2)
Alkaline Phosphatase: 77 U/L (ref 39–117)
BUN: 147 mg/dL — ABNORMAL HIGH (ref 6–23)
CO2: 32 mEq/L (ref 19–32)
Calcium: 7.3 mg/dL — ABNORMAL LOW (ref 8.4–10.5)
Chloride: 79 mEq/L — ABNORMAL LOW (ref 96–112)
Creatinine, Ser: 15.55 mg/dL — ABNORMAL HIGH (ref 0.50–1.35)
GFR calc Af Amer: 3 mL/min — ABNORMAL LOW (ref >90)
GFR calc non Af Amer: 3 mL/min — ABNORMAL LOW (ref >90)
Glucose, Bld: 131 mg/dL — ABNORMAL HIGH (ref 70–99)
Potassium: 3.4 mEq/L — ABNORMAL LOW (ref 3.5–5.1)
Sodium: 131 mEq/L — ABNORMAL LOW (ref 135–145)
Total Bilirubin: 0.8 mg/dL (ref 0.3–1.2)
Total Protein: 6.5 g/dL (ref 6.0–8.3)

## 2012-03-31 LAB — GLUCOSE, CAPILLARY
Glucose-Capillary: 122 mg/dL — ABNORMAL HIGH (ref 70–99)
Glucose-Capillary: 123 mg/dL — ABNORMAL HIGH (ref 70–99)
Glucose-Capillary: 121 mg/dL — ABNORMAL HIGH (ref 70–99)
Glucose-Capillary: 98 mg/dL (ref 70–99)

## 2012-03-31 LAB — CBC
HCT: 27.7 % — ABNORMAL LOW (ref 39.0–52.0)
Hemoglobin: 9.8 g/dL — ABNORMAL LOW (ref 13.0–17.0)
MCH: 24.6 pg — ABNORMAL LOW (ref 26.0–34.0)
MCHC: 35.4 g/dL (ref 30.0–36.0)
MCV: 69.6 fL — ABNORMAL LOW (ref 78.0–100.0)
Platelets: 195 10*3/uL (ref 150–400)
RBC: 3.98 MIL/uL — ABNORMAL LOW (ref 4.22–5.81)
RDW: 17.7 % — ABNORMAL HIGH (ref 11.5–15.5)
WBC: 9.2 10*3/uL (ref 4.0–10.5)

## 2012-03-31 LAB — HEPATITIS B SURFACE ANTIGEN: Hepatitis B Surface Ag: NEGATIVE

## 2012-03-31 LAB — HEPATITIS B CORE ANTIBODY, IGM: Hep B C IgM: NEGATIVE

## 2012-03-31 LAB — PHOSPHORUS: Phosphorus: 11.4 mg/dL — ABNORMAL HIGH (ref 2.3–4.6)

## 2012-03-31 LAB — HEPATITIS B SURFACE ANTIBODY,QUALITATIVE: Hep B S Ab: NEGATIVE

## 2012-03-31 MED ORDER — SODIUM CHLORIDE 0.9 % IV SOLN: 100.0000 mL | INTRAVENOUS | Status: DC | PRN

## 2012-03-31 MED ORDER — TRAMADOL HCL 50 MG PO TABS
50.0000 mg | ORAL_TABLET | Freq: Four times a day (QID) | ORAL | Status: DC | PRN
  Administered 2012-03-31 – 2012-04-06 (×9): 50 mg via ORAL
  Filled 2012-03-31 (×8): qty 1

## 2012-03-31 MED ORDER — NEPRO/CARBSTEADY PO LIQD
237.0000 mL | ORAL | Status: DC | PRN
  Filled 2012-03-31: qty 237

## 2012-03-31 MED ORDER — SODIUM CHLORIDE 0.9 % IV SOLN
INTRAVENOUS | Status: DC
  Administered 2012-03-31: 1000 mL via INTRAVENOUS
  Administered 2012-04-01 – 2012-04-03 (×4): via INTRAVENOUS

## 2012-03-31 MED ORDER — PENTAFLUOROPROP-TETRAFLUOROETH EX AERO: 1.0000 "application " | INHALATION_SPRAY | CUTANEOUS | Status: DC | PRN

## 2012-03-31 MED ORDER — RENA-VITE PO TABS
1.0000 | ORAL_TABLET | Freq: Every day | ORAL | Status: DC
  Administered 2012-03-31 – 2012-04-06 (×7): 1 via ORAL
  Filled 2012-03-31 (×7): qty 1

## 2012-03-31 MED ORDER — HEPARIN SODIUM (PORCINE) 1000 UNIT/ML DIALYSIS
1000.0000 [IU] | INTRAMUSCULAR | Status: DC | PRN
  Filled 2012-03-31: qty 1

## 2012-03-31 MED ORDER — ALTEPLASE 2 MG IJ SOLR
2.0000 mg | Freq: Once | INTRAMUSCULAR | Status: AC | PRN
  Filled 2012-03-31: qty 2

## 2012-03-31 MED ORDER — ACETAMINOPHEN 325 MG PO TABS
650.0000 mg | ORAL_TABLET | Freq: Four times a day (QID) | ORAL | Status: DC | PRN
  Administered 2012-04-01 – 2012-04-05 (×10): 650 mg via ORAL
  Filled 2012-03-31 (×10): qty 2

## 2012-03-31 MED ORDER — HEPARIN SODIUM (PORCINE) 1000 UNIT/ML DIALYSIS
40.0000 [IU]/kg | Freq: Once | INTRAMUSCULAR | Status: AC
  Administered 2012-03-31: 3600 [IU] via INTRAVENOUS_CENTRAL
  Filled 2012-03-31: qty 4

## 2012-03-31 MED ORDER — LIDOCAINE HCL (PF) 1 % IJ SOLN
5.0000 mL | INTRAMUSCULAR | Status: DC | PRN
  Filled 2012-03-31: qty 5

## 2012-03-31 MED ORDER — LIDOCAINE-PRILOCAINE 2.5-2.5 % EX CREA
1.0000 "application " | TOPICAL_CREAM | CUTANEOUS | Status: DC | PRN
  Filled 2012-03-31: qty 5

## 2012-03-31 NOTE — Procedures (Signed)
I was present at this session.  I have reviewed the session itself and made appropriate changes.HD via fem cath [place] L 10/29/201311:43 AM

## 2012-03-31 NOTE — Progress Notes (Signed)
Subjective: Patient awake and alert Tolerating solid renal diet Due to have femoral catheter/ hemodialysis today per Dr. Jimmy Footman. Ileostomy - thin output - 1200 cc  Objective: Vital signs in last 24 hours: Temp:  [97.6 F (36.4 C)-98.6 F (37 C)] 98.2 F (36.8 C) (10/29 0815) Pulse Rate:  [68-88] 70  (10/29 0810) Resp:  [14-25] 20  (10/29 0810) BP: (100-129)/(53-61) 129/61 mmHg (10/29 0810) SpO2:  [96 %-100 %] 96 % (10/29 0810) Weight:  [198 lb 10.2 oz (90.1 kg)] 198 lb 10.2 oz (90.1 kg) (10/29 0445) Last BM Date:  (ileostomy )  Intake/Output from previous day: 10/28 0701 - 10/29 0700 In: 2672 [P.O.:720; I.V.:1950; IV Piggyback:2] Out: [id number] [Urine:650; [id number] Intake/Output this shift: Total I/O In: 240 [P.O.:240] Out: 475 [Urine:175; Stool:300]  GI: soft, mildly tender on right side Ileostomy - thin watery output  Lab Results:   Basename 03/29/12 2143  WBC 14.7*  HGB 13.5  HCT 36.7*  PLT 358   BMET  Basename 03/31/12 0510 03/30/12 1111  NA 131* 129*  K 3.4* 4.7  CL 79* 83*  CO2 32 23  GLUCOSE 131* 110*  BUN 147* 151*  CREATININE 15.55* 17.02*  CALCIUM 7.3* 8.3*   PT/INR No results found for this basename: LABPROT:2,INR:2 in the last 72 hours ABG No results found for this basename: PHART:2,PCO2:2,PO2:2,HCO3:2 in the last 72 hours  Studies/Results: Ct Abdomen Pelvis W Contrast  03/29/2012  *RADIOLOGY REPORT*  Clinical Data: Status post fall.  History of intra-abdominal abscess secondary to the anastomotic leak.  History of colon cancer.  CT ABDOMEN AND PELVIS WITH CONTRAST  Technique:  Multidetector CT imaging of the abdomen and pelvis was performed following the standard protocol during bolus administration of intravenous contrast.  Contrast: 160mL OMNIPAQUE IOHEXOL 300 MG/ML  SOLN  Comparison: Chest and two views abdomen 02/16/2012.  Findings: The lung bases are clear.  There is no pleural or pericardial effusion.  The patient has a midline  surgical wound.  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  The patient is status post right hemicolectomy.  There is no abscess.  There is extensive infiltration of omental fat, particularly on the left.  There is no rim enhancing fluid collection although there are some small foci of fluid density material present.  No evidence of small bowel obstruction is present.  The stomach appears normal.  A few small stones are seen layering dependently in the gallbladder but there is no evidence of cholecystitis.  The liver, adrenal glands, spleen and pancreas appear normal.  Low attenuating lesions in both kidneys are most consistent with cysts. There are lymph nodes in the porta hepatis measuring up to 1.8 cm on image 21. Foley catheter is in place urinary bladder with an air-fluid level identified.  No focal bony abnormality is seen with extensive degenerative disease present about the hips.  IMPRESSION:  1.  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 2.  Extensive infiltration of omental fat, worse on the left, could be due to infectious or inflammatory process or postoperative change.  Omental tumor implants are possible but felt somewhat less likely.  No abscess is identified. 3.  Fatty infiltration of the liver. 4.  Small lymph node in the porta hepatis is nonspecific and likely reactive.  Recommend attention on follow-up exams. 5.  Small gallstones without evidence of cholecystitis.   Original Report Authenticated By: Arvid Right. Luther Parody, M.D.    US Renal  03/30/2012  *RADIOLOGY  REPORT*  Clinical Data: Acute renal failure.  RENAL/URINARY TRACT ULTRASOUND COMPLETE  Comparison:  CT scan dated 05/29/2012  Findings:  Right Kidney:  13.4 cm in length.  Several small cysts in the right kidney.  No hydronephrosis. Slightly echogenic renal parenchyma, equal to the liver.  Left Kidney:  12.6 cm in length.  3.3 cm oblong cyst in the upper pole.  No  hydronephrosis.  Bladder:  Almost empty.  IMPRESSION:  1.  No hydronephrosis.  Slight increased echogenicity of the renal parenchyma consistent with renal medical disease. 2.  Small benign-appearing cysts on both kidneys.   Original Report Authenticated By: Larey Seat, M.D.     Anti-infectives: Anti-infectives    None      Assessment/Plan: s/p * No surgery found * Volume depletion/ acute renal failure secondary to high ileostomy output. Once electrolytes are corrected, will need to use PRN Imodium to try to keep output less than 1000 ml/ day.  Patient not suitable for ileostomy reversal until April 2014 at earliest.  Will follow.   LOS: 2 days    Gentri Guardado K. 03/31/2012

## 2012-03-31 NOTE — Progress Notes (Signed)
Physical Therapy Treatment Patient Details Name: [name] MRN: [id number] DOB: 05/29/1951 Today's Date: 03/31/2012 Time: [id number] PT Time Calculation (min): 24 min  PT Assessment / Plan / Recommendation Comments on Treatment Session  Pt admitted with n/v and continues to progress with therapy. Pt orthostatic vitals taken during session and limiting factor to mobility and independence due to significant drop in BP with stance. Pt BP supine 118/60, sitting EOB 106/58, standing 89/57, and supine in bed again 112/56. HR in high 80s throughout. RN made aware of vitals.    Follow Up Recommendations  Post acute inpatient     Does the patient have the potential to tolerate intense rehabilitation  No, Recommend SNF  Barriers to Discharge        Equipment Recommendations  3 in 1 bedside comode    Recommendations for Other Services    Frequency Min 3X/week   Plan Discharge plan remains appropriate;Frequency remains appropriate    Precautions / Restrictions Precautions Precautions: Fall Restrictions Weight Bearing Restrictions: No   Pertinent Vitals/Pain None    Mobility  Bed Mobility Bed Mobility: Supine to Sit;Sit to Supine Supine to Sit: 4: Min assist;HOB elevated (HOB 30 degrees.) Sit to Supine: 4: Min assist;HOB flat Details for Bed Mobility Assistance: Assist for trunk to translate anterior and slow descent to bed. Cues for sequence and safety. Transfers Transfers: Sit to Stand;Stand to Sit Sit to Stand: 4: Min assist;With upper extremity assist;From bed Stand to Sit: 4: Min assist;With upper extremity assist;To bed Details for Transfer Assistance: Assist for balance and due to dizziness with stance. Cues for safest hand placement and sequence. Ambulation/Gait Ambulation/Gait Assistance: Not tested (comment) Stairs: No Wheelchair Mobility Wheelchair Mobility: No    Exercises     PT Diagnosis:    PT Problem List:   PT Treatment Interventions:     PT  Goals Acute Rehab PT Goals PT Goal Formulation: With patient Time For Goal Achievement: 04/13/12 Potential to Achieve Goals: Good PT Goal: Supine/Side to Sit - Progress: Progressing toward goal PT Goal: Sit to Supine/Side - Progress: Progressing toward goal PT Goal: Sit to Stand - Progress: Progressing toward goal PT Goal: Stand to Sit - Progress: Progressing toward goal  Visit Information  Last PT Received On: 03/31/12 Assistance Needed: +1    Subjective Data  Subjective: "I feel a little bit better." Patient Stated Goal: Decrease pain   Cognition  Overall Cognitive Status: Appears within functional limits for tasks assessed/performed Arousal/Alertness: Awake/alert Orientation Level: Appears intact for tasks assessed Behavior During Session: [facility] for tasks performed    Balance  Balance Balance Assessed: Yes Static Sitting Balance Static Sitting - Balance Support: Bilateral upper extremity supported;Feet supported Static Sitting - Level of Assistance: 5: Stand by assistance Static Sitting - Comment/# of Minutes: Pt able to sit EOB for 10 minutes with supervision. No dizziness with EOB.  End of Session PT - End of Session Equipment Utilized During Treatment: Gait belt Activity Tolerance: Patient tolerated treatment well Patient left: in bed;with call bell/phone within reach;with bed alarm set Nurse Communication: Mobility status;Other (comment) (Orthostatic vitals.)   GP     Cyndia Bent 03/31/2012, 10:08 AM  03/31/2012 Cyndia Bent, PT, DPT [phone]

## 2012-03-31 NOTE — Progress Notes (Signed)
Pt returned from HD, new femoral HD cath capped an clamped, dressing clean and dry, DP & PT 2+, pt denies pain, meal heated and family notified that pt had returned to room

## 2012-03-31 NOTE — Progress Notes (Signed)
Clinical Social Work Department BRIEF PSYCHOSOCIAL ASSESSMENT 03/31/2012  Patient:  [name],[name]     Account Number:  [id number]     Admit date:  03/29/2012  Clinical Social Worker:  Glenice Laine  Date/Time:  03/31/2012 03:22 AM  Referred by:  Physician  Date Referred:  03/30/2012 Referred for  SNF Placement   Other Referral:   Interview type:  Family Other interview type:    PSYCHOSOCIAL DATA Living Status:  FACILITY Admitted from facility:  [facility] Level of care:  [place] Primary support name:   Primary support relationship to patient:  CHILD, ADULT Degree of support available:   support from children is adequate. Daughter is interested in helping with the decision of placement.    CURRENT CONCERNS Current Concerns  Post-Acute Placement   Other Concerns:    SOCIAL WORK ASSESSMENT / PLAN Clinical social work Theatre manager called daughter, Joseph Art, on the phone.  CSW intern introduced self to patient's daughter. CSW intern talked with pt daughter about where her father was prior to coming to [facility]. The daughter stated that the pt was at [place].  CSW intern ask if returning to [place] was the plan after dc.  Pt daughter stated that her father had hesitations on returning to [place]. CSW explained to pt's daughter that with the Letter of guarantee; [facility] would be the only option other then returning home.  CSW intern also informed pt's daughter that physical therapy was recommending SNF.  CSW intern let pt's daughter process the information.  Pt's daughter said that she would talk with her father about returning to [place].  Pt's daughter also would like CSW to follow up after she is able to talk with her father.  CSW to follow up.   Assessment/plan status:  Information/Referral to [employer] Other assessment/ plan:   Information/referral to community resources:   Information was given to pt's daughter about SNF  replacement to [place].    PATIENT'S/FAMILY'S RESPONSE TO PLAN OF CARE: Daughter of pt was understanding of the situation. Daughter said she was going to talk with her father this evening and see what he would like to do.  CSW will follow for d/c planning-- Bonner Puna (BSW Intern) Gerre Scull, [phone]

## 2012-03-31 NOTE — Progress Notes (Signed)
Pt transporting to HD in bed - family aware of hemo orders

## 2012-03-31 NOTE — Progress Notes (Signed)
Subjective: Interval History: has complaints no appetite.  Objective: Vital signs in last 24 hours: Temp:  [97.6 F (36.4 C)-98.6 F (37 C)] 98.2 F (36.8 C) (10/29 0815) Pulse Rate:  [68-88] 70  (10/29 0810) Resp:  [14-25] 20  (10/29 0810) BP: (100-129)/(53-61) 129/61 mmHg (10/29 0810) SpO2:  [96 %-100 %] 96 % (10/29 0810) Weight:  [90.1 kg (198 lb 10.2 oz)] 90.1 kg (198 lb 10.2 oz) (10/29 0445) Weight change: 0.7 kg (1 lb 8.7 oz)  Intake/Output from previous day: 10/28 0701 - 10/29 0700 In: 2672 [P.O.:720; I.V.:1950; IV Piggyback:2] Out: [id number] [Urine:650; [id number] Intake/Output this shift: Total I/O In: 240 [P.O.:240] Out: 475 [Urine:175; Stool:300]  General appearance: alert and cooperative Resp: diminished breath sounds bilaterally Cardio: S1, S2 normal and systolic murmur: systolic ejection 2/6, decrescendo at 2nd left intercostal space GI: pos bs, ostomy, soft, incision Extremities: extremities normal, atraumatic, no cyanosis or edema  Lab Results:  [facility] 03/29/12 2143  WBC 14.7*  HGB 13.5  HCT 36.7*  PLT 358   BMET:  Basename 03/31/12 0510 03/30/12 1111  NA 131* 129*  K 3.4* 4.7  CL 79* 83*  CO2 32 23  GLUCOSE 131* 110*  BUN 147* 151*  CREATININE 15.55* 17.02*  CALCIUM 7.3* 8.3*   No results found for this basename: PTH:2 in the last 72 hours [place] Studies:  Basename 03/30/12 1611  IRON 77  TIBC 211*  TRANSFERRIN --  FERRITIN --    Studies/Results: Ct Abdomen Pelvis W Contrast  03/29/2012  *RADIOLOGY REPORT*  Clinical Data: Status post fall.  History of intra-abdominal abscess secondary to the anastomotic leak.  History of colon cancer.  CT ABDOMEN AND PELVIS WITH CONTRAST  Technique:  Multidetector CT imaging of the abdomen and pelvis was performed following the standard protocol during bolus administration of intravenous contrast.  Contrast: 118mL OMNIPAQUE IOHEXOL 300 MG/ML  SOLN  Comparison: Chest and two views abdomen 02/16/2012.   Findings: The lung bases are clear.  There is no pleural or pericardial effusion.  The patient has a midline surgical wound.  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  The patient is status post right hemicolectomy.  There is no abscess.  There is extensive infiltration of omental fat, particularly on the left.  There is no rim enhancing fluid collection although there are some small foci of fluid density material present.  No evidence of small bowel obstruction is present.  The stomach appears normal.  A few small stones are seen layering dependently in the gallbladder but there is no evidence of cholecystitis.  The liver, adrenal glands, spleen and pancreas appear normal.  Low attenuating lesions in both kidneys are most consistent with cysts. There are lymph nodes in the porta hepatis measuring up to 1.8 cm on image 21. Foley catheter is in place urinary bladder with an air-fluid level identified.  No focal bony abnormality is seen with extensive degenerative disease present about the hips.  IMPRESSION:  1.  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 2.  Extensive infiltration of omental fat, worse on the left, could be due to infectious or inflammatory process or postoperative change.  Omental tumor implants are possible but felt somewhat less likely.  No abscess is identified. 3.  Fatty infiltration of the liver. 4.  Small lymph node in the porta hepatis is nonspecific and likely reactive.  Recommend attention on follow-up exams. 5.  Small gallstones without evidence of cholecystitis.   Original  Report Authenticated By: Arvid Right. Luther Parody, M.D.    US Renal  03/30/2012  *RADIOLOGY REPORT*  Clinical Data: Acute renal failure.  RENAL/URINARY TRACT ULTRASOUND COMPLETE  Comparison:  CT scan dated 05/29/2012  Findings:  Right Kidney:  13.4 cm in length.  Several small cysts in the right kidney.  No hydronephrosis. Slightly echogenic renal parenchyma,  equal to the liver.  Left Kidney:  12.6 cm in length.  3.3 cm oblong cyst in the upper pole.  No hydronephrosis.  Bladder:  Almost empty.  IMPRESSION:  1.  No hydronephrosis.  Slight increased echogenicity of the renal parenchyma consistent with renal medical disease. 2.  Small benign-appearing cysts on both kidneys.   Original Report Authenticated By: Larey Seat, M.D.     I have reviewed the patient's current medications.  Assessment/Plan: 1 AKI vol improving.  Need to change IVF to ns or 1/2 NS. Getting alkalotic.  No improvement GFR, will do HD today. 2 S/P bowel resx and dehisc, now ostomy high output. 3 Malnutrition  Poor intake with uremia 4 Tobacco abuse P fem cath, HD, REC change to NS 50cc/h    LOS: 2 days   Seraphim Affinito L 03/31/2012,9:33 AM

## 2012-03-31 NOTE — Procedures (Signed)
Hemodialysis Catheter Insertion Procedure Note [name] [id number] 12-Sep-1950  Procedure: Insertion of Hemodialysis Catheter Indications: Dialysis Access   Procedure Details Consent: Risks of procedure as well as the alternatives and risks of each were explained to the (patient/caregiver).  Consent for procedure obtained. Time Out: Verified patient identification, verified procedure, site/side was marked, verified correct patient position, special equipment/implants available, medications/allergies/relevent history reviewed, required imaging and test results available.  Performed  Maximum sterile technique was used including antiseptics, cap, gloves, gown, hand hygiene, mask and sheet. Skin prep: Iodine solution; local anesthetic administered Triple lumen hemodialysis catheter was inserted into right femoral vein due to patient being a dialysis patient using the Seldinger technique.  Evaluation Blood flow will eval Complications: No apparent complications Patient did tolerate procedure well. Chest X-ray ordered to verify placement.  CXR: not indic.   Delisha Peaden L 03/31/2012

## 2012-03-31 NOTE — Progress Notes (Signed)
Resident Co-sign Daily Note: I have seen the patient and reviewed the daily progress note by Elmer Sow, MS-III and discussed the care of the patient with him.  See my separate note for documentation of my findings, assessment, and plans.

## 2012-03-31 NOTE — Progress Notes (Signed)
Patient ID: [name], male   DOB: 30-Jun-1950, 61 y.o.   MRN: [id number] Medical Student Daily Progress Note   Subjective:    Interval Events:   Pt remained afebrile overnight.  Per the pt he woke up in the middle of the night felt like he was unable to open his eyes and became frightened after which he did not sleep well, dozing off and waking up constantly.  No other acute events.  Today the pt states that the pain at his incision site is unchanged but manageable (his last dose of morphine was at [facility]).  He does endorse some waxing/waning discomfort at his ileostomy site characterizing it as not so much a pain but a discomfort with a burning quality.  During the ROS patient became tearful saying "I appreciate everybody helping me, I'm not use to this and it is a bit overwhelming."  ROS - Endorses dry mouth, discomfort at incision site, and ileostomy.  Denies HA, dizziness, changes in vision, changes in hearing, nasal congestion, sore throat, chest pain, SOB, cough, pain at catheter, weakness, numbness/tingling in extremities.    Objective:    Vital Signs:   Temp:  [97.6 F (36.4 C)-98.6 F (37 C)] 98.1 F (36.7 C) (10/29 0445) Pulse Rate:  [68-88] 88  (10/29 0445) Resp:  [14-25] 24  (10/29 0445) BP: (100-123)/(53-61) 107/55 mmHg (10/29 0445) SpO2:  [96 %-100 %] 100 % (10/29 0445) Weight:  [90.1 kg (198 lb 10.2 oz)] 90.1 kg (198 lb 10.2 oz) (10/29 0445) Last BM Date:  (ileostomy )   Weights:  Filed Weights   03/30/12 0200 03/31/12 0445  Weight: 89.4 kg (197 lb 1.5 oz) 90.1 kg (198 lb 10.2 oz)   Net since admission: 0.7 kg  Intake/Output:   Intake/Output Summary (Last 24 hours) at 03/31/12 0756 Last data filed at 03/31/12 0700  Gross per 24 hour  Intake   2672 ml  Output   1775 ml  Net    897 ml   Net since admission:  1657.33 24 hr Urine Output 10/28 0701-10/29 0701: 650   Physical Exam: GENERAL:  alert and oriented, able to follow commands and was  cooperative with the exam, resting comfortably in bed and in no distress EYES:  Pupils small but equal, round, and reactive to light; sclera anicteric ENT:  Dry appearing mucosa, lips cracked, throat is clean and non-erythematous NECK:  no JVD, no thyromegaly, shoddy right anterior cervical lymph node mobile and non-tender LUNGS:  clear to auscultation bilaterally, normal work of breathing HEART:  normal rate; regular rhythm; normal S1 and S2, no S3 or S4 appreciated; II/VI systolic murmur best heard at upper right sternal border, no rubs, or clicks ABDOMEN:  soft, +BS, tender to palpation near incision site but improved from yesterday, ileostomy noted in right lower quadrant covered with ileostomy bag with gas and liquid stool in it, superficially open vertical incision at the midline above the umbilicus packed with gauze and covered in Tegaderm when exposed wound shows granulation tissue, intact fascial sutures and with palpation produced some purulence from the lateral aspect, pt did not complain of pain upon palpation but said "I know you're down there," odor noted but unclear of it was from wound or body odor. GENITALS: foley catheter in place, no signs of erythema at penile meatus EXTREMITIES:  No peripheral edema, 2+ pulses (radial, dorsalis pedis) bilaterally, dry skin noted bilaterally on shins NEURO: EOMI, palate rises symetrically, tongue protrudes to midline, unable to elicit  reflexes at biceps, 1+ patellar reflexes bilaterally (muscles jumped but no movement of joint) SKIN:  Grade 2 decubitus ulcer noted at top of gluteal cleft tender to removal of bandage no signs of pus.   Labs: Basic Metabolic Panel:  Lab [id number] 0510 03/30/12 1111 03/30/12 0450 03/29/12 2143  NA 131* 129* 127* 120*  K 3.4* 4.7 4.5 6.5*  CL 79* 83* 82* 74*  CO2 32 23 18* 16*  GLUCOSE 131* 110* 86 158*  BUN 147* 151* 158* 156*  CREATININE 15.55* 17.02* 16.82* 17.38*  CALCIUM 7.3* 8.3* 8.5 --  MG -- -- -- --    PHOS 11.4* -- -- --    Liver Function Tests:  Lab 03/31/12 0510 03/30/12 0450 03/29/12 2143  AST 24 26 33  ALT 13 18 23   ALKPHOS 77 104 130*  BILITOT 0.8 0.8 1.0  PROT 6.5 8.1 9.7*  ALBUMIN 2.3* 2.8* 3.4*    Lab 03/29/12 2143  LIPASE 143*  AMYLASE --   CBC:  Lab 03/31/12 0510 03/29/12 2143  WBC -- 14.7*  NEUTROABS PENDING 10.1*  HGB -- 13.5  HCT -- 36.7*  MCV -- 68.3*  PLT -- 358   CBG:  Lab 03/30/12 2134 03/30/12 1508 03/30/12 1154 03/30/12 0734 03/30/12 0202  GLUCAP 123* 130* 114* 94 92   Iron/TIBC/Ferritin:    Component Value Date/Time   IRON 77 03/30/2012 1611   TIBC 211* 03/30/2012 1611   FERRITIN 21* 12/07/2009 1508    Microbiology: Results for orders placed during the hospital encounter of 03/29/12  MRSA PCR SCREENING     Status: Normal   Collection Time   03/30/12  3:18 AM      Component Value Range Status Comment   MRSA by PCR NEGATIVE  NEGATIVE Final   URINE CULTURE     Status: Normal   Collection Time   03/30/12  3:45 AM      Component Value Range Status Comment   Specimen Description URINE, CATHETERIZED   Final    Special Requests NONE   Final    Culture  Setup Time 03/30/2012 04:33   Final    Colony Count NO GROWTH   Final    Culture NO GROWTH   Final    Report Status 03/31/2012 FINAL   Final     Imaging: Ct Abdomen Pelvis W Contrast  03/29/2012  *RADIOLOGY REPORT*  Clinical Data: Status post fall.  History of intra-abdominal abscess secondary to the anastomotic leak.  History of colon cancer.  CT ABDOMEN AND PELVIS WITH CONTRAST  Technique:  Multidetector CT imaging of the abdomen and pelvis was performed following the standard protocol during bolus administration of intravenous contrast.  Contrast: 143mL OMNIPAQUE IOHEXOL 300 MG/ML  SOLN  Comparison: Chest and two views abdomen 02/16/2012.  Findings: The lung bases are clear.  There is no pleural or pericardial effusion.  The patient has a midline surgical wound.  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  The patient is status post right hemicolectomy.  There is no abscess.  There is extensive infiltration of omental fat, particularly on the left.  There is no rim enhancing fluid collection although there are some small foci of fluid density material present.  No evidence of small bowel obstruction is present.  The stomach appears normal.  A few small stones are seen layering dependently in the gallbladder but there is no evidence of cholecystitis.  The liver, adrenal glands, spleen and pancreas appear normal.  Low attenuating lesions in  both kidneys are most consistent with cysts. There are lymph nodes in the porta hepatis measuring up to 1.8 cm on image 21. Foley catheter is in place urinary bladder with an air-fluid level identified.  No focal bony abnormality is seen with extensive degenerative disease present about the hips.  IMPRESSION:  1.  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 2.  Extensive infiltration of omental fat, worse on the left, could be due to infectious or inflammatory process or postoperative change.  Omental tumor implants are possible but felt somewhat less likely.  No abscess is identified. 3.  Fatty infiltration of the liver. 4.  Small lymph node in the porta hepatis is nonspecific and likely reactive.  Recommend attention on follow-up exams. 5.  Small gallstones without evidence of cholecystitis.   Original Report Authenticated By: Arvid Right. Luther Parody, M.D.    US Renal  03/30/2012  *RADIOLOGY REPORT*  Clinical Data: Acute renal failure.  RENAL/URINARY TRACT ULTRASOUND COMPLETE  Comparison:  CT scan dated 05/29/2012  Findings:  Right Kidney:  13.4 cm in length.  Several small cysts in the right kidney.  No hydronephrosis. Slightly echogenic renal parenchyma, equal to the liver.  Left Kidney:  12.6 cm in length.  3.3 cm oblong cyst in the upper pole.  No hydronephrosis.  Bladder:  Almost empty.  IMPRESSION:   1.  No hydronephrosis.  Slight increased echogenicity of the renal parenchyma consistent with renal medical disease. 2.  Small benign-appearing cysts on both kidneys.   Original Report Authenticated By: Larey Seat, M.D.       Medications:    Infusions:    .  sodium bicarbonate infusion sterile water 1000 mL 250 mL/hr at 03/30/12 0416   Followed by  .  sodium bicarbonate infusion sterile water 1000 mL 150 mL/hr at 03/31/12 0248     Scheduled Medications:    . antiseptic oral rinse  15 mL Mouth Rinse BID  . atorvastatin  20 mg Oral QHS  . feeding supplement  1 Container Oral TID BM  . heparin  5,000 Units Subcutaneous Q8H  . insulin aspart      . insulin aspart  0-9 Units Subcutaneous TID AC & HS  . insulin glargine  10 Units Subcutaneous QHS  . sodium chloride  3 mL Intravenous Q12H  . DISCONTD: carvedilol  6.25 mg Oral BID WC  . DISCONTD: insulin aspart  0-9 Units Subcutaneous Q4H  . DISCONTD: sodium chloride  1,000 mL Intravenous Once     PRN Medications: alum & mag hydroxide-simeth, morphine injection, ondansetron (ZOFRAN) IV, ondansetron    Assessment/ Plan:    Mr. Ponzi is a 61 yo gentleman with a history of type 2 diabetes on insulin, invasive adenocarcinoma of colon s/p right hemicolectomy complicated by postop intra-abdominal abscess with a leaking anastamosis, OSA, hypertension on HD #2 being treated for acute renal failure.  Acute renal failure - Creatinine 15.55, BUN 147 (baseline Cr 0.9-1)  Likely secondary to extreme volume depletion in the setting of nausea, vomiting, increased ileostomy output. On 02/05/12 he weighed 256 lbs today he weighs 198. Appears volume depleted on exam. BMP values generally improving. Anion gap is down to 24. Metabolic acidosis likely due to renal failure. Renal US showed no hydronephrosis and bilateral cysts. -Renal team has decided to proceed with hemodialysis given his GFR has not improved (still 3 mL/hr)  -Continue aggressive  rehydration with NS at 150cc/hr, CO2 has normalized he no longer  needs HCO3 in his fluids  -Strict I and O's, daily weights  -Zofran PRN   -Follow BMP   Hypokalemia -K 3.4 as of 0510 10/29 Pt was hyperkalemic at admission with ECG changes that resolved with sodium polystyrene sulfonate.  Since admission his K has decreased to wnl and today is just below reference range.  Likely due to GI losses and NPO status iv fluids have not had K in them.   -Now that pt is taking PO low K is likely to resolve -monitor with BMP  Colon adenocarcinoma S/p right hemicolectomy and ileostomy creation  Elevated WBC to 14.7 10/27, afebrile, hx of abdominal abscess 2/2 leaking anastamosis. CT scan abd/pelvis with inflammation of omental fat, no focal abscess noted, no obstruction. Pt with abdominal pain around incision, no rebound or guarding. Ileostomy site appears clear, +output. Does not appear septic.  -Surgery performed bedside debridement 10/28 and did not suggest starting abx, we agree with this assessment given that pt has remained afebrile and wound's appearance hasn't changed -ileostomy care -follow CBC, vitals  -Morphine 2-4 mg q4 hours prn for analgesia  Diabetes:  -sugars have been relatively stable with only one aberrant reading of 131 10/29 @ 0501 -hold metformin as pt with metabolic acidosis and renal failure  -continue lantus 10 units daily with correction scale  -Check blood sugar every 4 hours   Hypotension: Pt's BP's remain on the lower side of normal while occasionally decreasing  -continue hold home carvedilol -continue aggressive rehydration given latest BP's and pt's continued signs of dehydration on physical exam (cracked lips dry tongue) -Increased PO intake should help this as well  Hyperlipidemia: -Continue home atorvastatin -Goal of LDL <70  VTE Prophylaxis:  -heparin [place]   Diet:  -Surgery is ok with diet.  Renal diet has been ordered.       Length of Stay: 2  days   This is a Careers information officer Note.  The care of the patient was discussed with Dr. Eulas Post and the assessment and plan formulated with their assistance.  Please see their attached note or addendum for official documentation of the daily encounter.

## 2012-03-31 NOTE — Progress Notes (Addendum)
Subjective: Pt very anxious overnight. Feeling better this morning. Abdominal pain has improved. Uop improved. Denies  N/V.   Objective: Vital signs in last 24 hours: Filed Vitals:   03/30/12 2000 03/31/12 0000 03/31/12 0445 03/31/12 0815  BP: 123/53 107/55 107/55   Pulse: 74 77 88   Temp: 98 F (36.7 C) 98.6 F (37 C) 98.1 F (36.7 C) 98.2 F (36.8 C)  TempSrc: Oral Oral Oral Oral  Resp: 14 19 24    Height:      Weight:   198 lb 10.2 oz (90.1 kg)   SpO2: 100% 96% 100%    Physical Exam: Vitals reviewed. General: Resting in bed, NAD HEENT: PERRL, EOMI, no scleral icterus Cardiac: RRR, no rubs, murmurs or gallops Pulm: Clear to auscultation bilaterally, no wheezes, rales, or rhonchi Abd: Soft, nontender, wound c/d/i with good granulation tissue, nondistended, BS present, ostomy with liquid output Ext: 2+ pulses. Warm and well perfused, no pedal edema Skin: Stage 2, small decubitus ulcer, unchanged since admission. Neuro: Alert and oriented X3, cranial nerves II-XII grossly intact, strength and sensation to light touch equal in bilateral upper and lower extremities  Labs: Basic Metabolic Panel:  Lab  [id number]  03/30/12 1111  03/30/12 0450  03/29/12 2143   NA  131*  129*  127*  120*   K  3.4*  4.7  4.5  6.5*   CL  79*  83*  82*  74*   CO2  32  23  18*  16*   GLUCOSE  131*  110*  86  158*   BUN  147*  151*  158*  156*   CREATININE  15.55*  17.02*  16.82*  17.38*   CALCIUM  7.3*  8.3*  8.5  --   MG  --  --  --  --   PHOS  11.4*  --  --  --    Liver Function Tests: Lab  03/31/12 0510  03/30/12 0450  03/29/12 2143   AST  24  26  33   ALT  13  18  23    ALKPHOS  77  104  130*   BILITOT  0.8  0.8  1.0   PROT  6.5  8.1  9.7*   ALBUMIN  2.3*  2.8*  3.4*    Lab  03/29/12 2143   LIPASE  143*   AMYLASE  --    CBC: Lab  03/31/12 0510  03/29/12 2143   WBC  --  14.7*   NEUTROABS  PENDING  10.1*   HGB  --  13.5   HCT  --  36.7*   MCV  --  68.3*   PLT  --  358     CBG: Lab  03/30/12 2134  03/30/12 1508  03/30/12 1154  03/30/12 0734  03/30/12 0202   GLUCAP  123*  130*  114*  94  67    Microbiology: Results for orders placed during the hospital encounter of 03/29/12   MRSA PCR SCREENING     Status: Normal     Collection Time     03/30/12  3:18 AM       Component  Value  Range  Status  Comment     MRSA by PCR  NEGATIVE   NEGATIVE  Final     URINE CULTURE     Status: Normal     Collection Time     03/30/12  3:45 AM       Component  Value  Range  Status  Comment     Specimen Description  URINE, CATHETERIZED     Final       Special Requests  NONE     Final       Culture  Setup Time  03/30/2012 04:33     Final       Colony Count  NO GROWTH     Final       Culture  NO GROWTH     Final       Report Status  03/31/2012 FINAL     Final      Imaging: Ct Abdomen Pelvis W Contrast  03/29/2012  *RADIOLOGY REPORT*  Clinical Data: Status post fall.  History of intra-abdominal abscess secondary to the anastomotic leak.  History of colon cancer.  CT ABDOMEN AND PELVIS WITH CONTRAST  Technique:  Multidetector CT imaging of the abdomen and pelvis was performed following the standard protocol during bolus administration of intravenous contrast.  Contrast: 13mL OMNIPAQUE IOHEXOL 300 MG/ML  SOLN  Comparison: Chest and two views abdomen 02/16/2012.  Findings: The lung bases are clear.  There is no pleural or pericardial effusion.  The patient has a midline surgical wound.  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  The patient is status post right hemicolectomy.  There is no abscess.  There is extensive infiltration of omental fat, particularly on the left.  There is no rim enhancing fluid collection although there are some small foci of fluid density material present.  No evidence of small bowel obstruction is present.  The stomach appears normal.  A few small stones are seen layering dependently in the gallbladder but there is no evidence of cholecystitis.  The  liver, adrenal glands, spleen and pancreas appear normal.  Low attenuating lesions in both kidneys are most consistent with cysts. There are lymph nodes in the porta hepatis measuring up to 1.8 cm on image 21. Foley catheter is in place urinary bladder with an air-fluid level identified.  No focal bony abnormality is seen with extensive degenerative disease present about the hips.  IMPRESSION:  1.  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 2.  Extensive infiltration of omental fat, worse on the left, could be due to infectious or inflammatory process or postoperative change.  Omental tumor implants are possible but felt somewhat less likely.  No abscess is identified. 3.  Fatty infiltration of the liver. 4.  Small lymph node in the porta hepatis is nonspecific and likely reactive.  Recommend attention on follow-up exams. 5.  Small gallstones without evidence of cholecystitis.   Original Report Authenticated By: Arvid Right. Luther Parody, M.D.     US Renal 03/30/2012  *RADIOLOGY REPORT*  Clinical Data: Acute renal failure.  RENAL/URINARY TRACT ULTRASOUND COMPLETE  Comparison:  CT scan dated 05/29/2012  Findings:  Right Kidney:  13.4 cm in length.  Several small cysts in the right kidney.  No hydronephrosis. Slightly echogenic renal parenchyma, equal to the liver.  Left Kidney:  12.6 cm in length.  3.3 cm oblong cyst in the upper pole.  No hydronephrosis.  Bladder: Almost empty.  IMPRESSION:  1.  No hydronephrosis.  Slight increased echogenicity of the renal parenchyma consistent with renal medical disease. 2.  Small benign-appearing cysts on both kidneys.   Original Report Authenticated By: Larey Seat, M.D.        Medications: I have reviewed the patient's current medications. Scheduled Meds:  . antiseptic  oral rinse  15 mL Mouth Rinse BID  . atorvastatin  20 mg Oral QHS  . feeding supplement  1 Container Oral TID BM  . heparin  5,000 Units  Subcutaneous Q8H  . insulin aspart      . insulin aspart  0-9 Units Subcutaneous TID AC & HS  . insulin glargine  10 Units Subcutaneous QHS  . sodium chloride  3 mL Intravenous Q12H  . DISCONTD: carvedilol  6.25 mg Oral BID WC  . DISCONTD: insulin aspart  0-9 Units Subcutaneous Q4H  . DISCONTD: sodium chloride  1,000 mL Intravenous Once   Continuous Infusions: .  sodium bicarbonate infusion sterile water 1000 mL 250 mL/hr at 03/30/12 0416   Followed by  .  sodium bicarbonate infusion sterile water 1000 mL 150 mL/hr at 03/31/12 0248   PRN Meds:.alum & mag hydroxide-simeth, morphine injection, ondansetron (ZOFRAN) IV, ondansetron  Assessment/Plan:  1. ARF: Creatinine 18 on admission, and pt reported no uop in at least 4 days. He has had increased ostomy output and emesis over the past few days. Unfortunately a CT abd/pelvis with contrast was obtained in the ED, which likely worsened his renal failure. Renal was consulted and pt aggressively hydrated with bicarb in sterile water per Nephrology. Cr 15.55 this morning, and uop has improved. Nephrology continues to follow, and feels that he needs HD today. They will place a temporary HD cath and begin dialysis. Will continue IVF per Neph recs. - q12 BMPs - HD today per Nephrology - Monitor uop - NS@50cc /hr  2. Colon adenocarcinoma s/p right hemicolectomy: Pt with unfortunate anastomotic leak with intraabdominal abscess formation s/p right hemicolectomy in September. He was taken back to the OR for multiple washouts, and his abdomen was closed on 02/26/12. He had a wound vac in place, which was removed at d/c on 03/10/12. The lower half of the wound healed, but the upper half is still healing and is open superficially with visible fascial sutures. Fibrinous material is present in the wound on admission to the hospital for ARF. Possible some mild purulence in the inferior portion of the wound. General Surgery saw the pt and cleaned the wound- at this  time they did not feel that the pt requires abx. Wound Care is also on board and is following. Wound looks good this morning and appears to be healing well with good granulation tissue, no purulence visible or expelled. - Continue wound care per General Surgery and Wound Care  3. Hyperkalemia: Potassium 6.5 on admission, but pt very dehydrated. After IVF started, am BMP with K+ 4.5 --> 4.7 --> 3.4. Will continue to hydrate and monitor potassium levels.  - q12h BMP - Electrolytes will be corrected with HD  4. Stage 2 decubitus ulcer: Present on admission. Wound Care consulted - F/u with Wound Care recs  5. DVT PPx: [place] Heparin    LOS: 2 days   Otho Bellows 03/31/2012, 8:19 AM

## 2012-03-31 NOTE — Progress Notes (Addendum)
Internal Medicine Teaching Service Attending Note Date: 03/31/2012  Patient name: [name]  Medical record number: [id number]  Date of birth: 20-Mar-1951    This patient has been seen and discussed with the house staff. Please see their note for complete details. I concur with their findings with the following additions/corrections: Pt receiving HD today as his Cr ha not improved over last 24 hours Appreciate surgical f/u of his wound, previous colon cancer.  WOC eval for decubitus, air mattress, nutrition optimization My great appreciation to the nephrology service   Bobby Rumpf 03/31/2012, 3:22 PM

## 2012-04-01 ENCOUNTER — Inpatient Hospital Stay (HOSPITAL_COMMUNITY): Payer: Medicaid Other

## 2012-04-01 ENCOUNTER — Encounter (INDEPENDENT_AMBULATORY_CARE_PROVIDER_SITE_OTHER): Payer: PRIVATE HEALTH INSURANCE | Admitting: Surgery

## 2012-04-01 LAB — RENAL FUNCTION PANEL
Albumin: 2.3 g/dL — ABNORMAL LOW (ref 3.5–5.2)
BUN: 79 mg/dL — ABNORMAL HIGH (ref 6–23)
CO2: 30 mEq/L (ref 19–32)
Calcium: 7.1 mg/dL — ABNORMAL LOW (ref 8.4–10.5)
Chloride: 91 mEq/L — ABNORMAL LOW (ref 96–112)
Creatinine, Ser: 10.21 mg/dL — ABNORMAL HIGH (ref 0.50–1.35)
GFR calc Af Amer: 6 mL/min — ABNORMAL LOW (ref >90)
GFR calc non Af Amer: 5 mL/min — ABNORMAL LOW (ref >90)
Glucose, Bld: 95 mg/dL (ref 70–99)
Phosphorus: 6.2 mg/dL — ABNORMAL HIGH (ref 2.3–4.6)
Potassium: 3.7 mEq/L (ref 3.5–5.1)
Sodium: 133 mEq/L — ABNORMAL LOW (ref 135–145)

## 2012-04-01 LAB — GLUCOSE, CAPILLARY
Glucose-Capillary: 160 mg/dL — ABNORMAL HIGH (ref 70–99)
Glucose-Capillary: 127 mg/dL — ABNORMAL HIGH (ref 70–99)
Glucose-Capillary: 105 mg/dL — ABNORMAL HIGH (ref 70–99)
Glucose-Capillary: 117 mg/dL — ABNORMAL HIGH (ref 70–99)

## 2012-04-01 LAB — CBC
HCT: 28.3 % — ABNORMAL LOW (ref 39.0–52.0)
Hemoglobin: 9.6 g/dL — ABNORMAL LOW (ref 13.0–17.0)
MCH: 24.6 pg — ABNORMAL LOW (ref 26.0–34.0)
MCHC: 33.9 g/dL (ref 30.0–36.0)
MCV: 72.4 fL — ABNORMAL LOW (ref 78.0–100.0)
Platelets: 160 10*3/uL (ref 150–400)
RBC: 3.91 MIL/uL — ABNORMAL LOW (ref 4.22–5.81)
RDW: 17.9 % — ABNORMAL HIGH (ref 11.5–15.5)
WBC: 10.1 10*3/uL (ref 4.0–10.5)

## 2012-04-01 LAB — HIV ANTIBODY (ROUTINE TESTING W REFLEX): HIV: NONREACTIVE

## 2012-04-01 LAB — HEPATITIS B CORE ANTIBODY, TOTAL: Hep B Core Total Ab: NEGATIVE

## 2012-04-01 MED ORDER — ALTEPLASE 2 MG IJ SOLR
2.0000 mg | Freq: Once | INTRAMUSCULAR | Status: AC | PRN
  Filled 2012-04-01: qty 2

## 2012-04-01 MED ORDER — SODIUM CHLORIDE 0.9 % IV SOLN: 100.0000 mL | INTRAVENOUS | Status: DC | PRN

## 2012-04-01 MED ORDER — HEPARIN SODIUM (PORCINE) 1000 UNIT/ML DIALYSIS
40.0000 [IU]/kg | Freq: Once | INTRAMUSCULAR | Status: DC
  Filled 2012-04-01: qty 4

## 2012-04-01 MED ORDER — PENTAFLUOROPROP-TETRAFLUOROETH EX AERO: 1.0000 "application " | INHALATION_SPRAY | CUTANEOUS | Status: DC | PRN

## 2012-04-01 MED ORDER — NEPRO/CARBSTEADY PO LIQD
237.0000 mL | ORAL | Status: DC | PRN
  Filled 2012-04-01: qty 237

## 2012-04-01 MED ORDER — LIDOCAINE HCL (PF) 1 % IJ SOLN
5.0000 mL | INTRAMUSCULAR | Status: DC | PRN
  Filled 2012-04-01: qty 5

## 2012-04-01 MED ORDER — LIDOCAINE-PRILOCAINE 2.5-2.5 % EX CREA
1.0000 "application " | TOPICAL_CREAM | CUTANEOUS | Status: DC | PRN
  Filled 2012-04-01: qty 5

## 2012-04-01 MED ORDER — HEPARIN SODIUM (PORCINE) 1000 UNIT/ML DIALYSIS
1000.0000 [IU] | INTRAMUSCULAR | Status: DC | PRN
  Filled 2012-04-01: qty 1

## 2012-04-01 NOTE — Progress Notes (Signed)
Patient ID: [name], male   DOB: January 30, 1951, 61 y.o.   MRN: [id number] Medical Student Daily Progress Note   Subjective:    Interval Events:   Started hemodialysis 10/29 with a temporary catheter, went smoothly patient's only complaint was some abdominal cramping during the procedure.  No acute events overnight.  Today he still complains of poor sleep, most of the hospital noises seem to wake him easily however he denies waking up frightened as he did the morning of 10/29.  Other complaints include mild bilateral back pain which resolved with tramadol.  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  He is still complaining of sore throat and dry mouth but after talking with him this seems to be his baseline in the morning, for which he usually drinks water.  Pt denies fever, chills, HA, dizziness,  SOB, chest pain, weakness, numbness, and tingling in extremities.    Objective:    Vital Signs:   Temp:  [97.7 F (36.5 C)-99.2 F (37.3 C)] 98.2 F (36.8 C) (10/30 0753) Pulse Rate:  [64-76] 74  (10/30 0416) Resp:  [16-21] 20  (10/30 0416) BP: (89-117)/(53-63) 101/53 mmHg (10/30 0754) SpO2:  [97 %-100 %] 100 % (10/30 0416) Weight:  [94 kg (207 lb 3.7 oz)-96.7 kg (213 lb 3 oz)] 96.7 kg (213 lb 3 oz) (10/30 0416) Last BM Date:  (ileostomy )   Weights: 24-hour Weight change: 3.9 kg (8 lb 9.6 oz)  Filed Weights   03/31/12 1035 03/31/12 1446 04/01/12 0416  Weight: 94 kg (207 lb 3.7 oz) 94.2 kg (207 lb 10.8 oz) 96.7 kg (213 lb 3 oz)   Net since admission: +7.3 kg   Intake/Output:   Intake/Output Summary (Last 24 hours) at 04/01/12 [id number] Last data filed at 04/01/12 0753  Gross per 24 hour  Intake    890 ml  Output    695 ml  Net    195 ml     Net since admission:  1792.33 mL   Physical Exam: GENERAL:  alert and oriented; resting comfortably in bed and in no distress EYES:  Pupils small but equal, round, and reactive to  light; sclera anicteric slightly injected, conjunctiva do not appear pale ENT:  Cracked lips, tongue appears dry, nares are non-swollen non-erythematous, throat is clean and non-erythematous NECK:  no JVD, mobile non tender anterior cervical lymph nodes palpated LUNGS:  clear to auscultation bilaterally, normal work of breathing HEART:  normal rate; regular rhythm; normal S1 and S2, no S3 or S4 appreciated; Grade I-II/VI systolic murmur best heard at upper right sternal border, no rubs or clicks ABDOMEN:  soft, non-tender, normal bowel sounds, no masses palpated, 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 unchanged with some solid stool present, superficially open vertical wound approximately 5 centimeters in length with good granulation tissue intact fascial sutures seen BACK: Grade 2 decubitus ulcer noted at sacrum with bandage in place, ulcer is red and painful with palpation appears unchanged from yesterday, brown material around the ulcer and on bandage EXTREMITIES:  No peripheral edema, dry skin noted on shins bilaterally NEURO: EOMI, normal grip strength, palate rises symmetrically, normal eye closure strength, sensation to light touch intact throughout GENITALS: Foley catheter in place, mucopurulent discharge noted underneath foreskin unclear if discharge was originating from urethra as foreskin enveloped both head of penis and catheter SKIN:  normal turgor    Labs: Basic Metabolic Panel:  Lab [id number] 03/31/12 0510 03/30/12  1111 03/30/12 0450 03/29/12 2143  NA 133* 131* 129* 127* 120*  K 3.7 3.4* 4.7 4.5 6.5*  CL 91* 79* 83* 82* 74*  CO2 30 32 23 18* 16*  GLUCOSE 95 131* 110* 86 158*  BUN 79* 147* 151* 158* 156*  CREATININE 10.21* 15.55* 17.02* 16.82* 17.38*  CALCIUM 7.1* 7.3* 8.3* -- --  MG -- -- -- -- --  PHOS 6.2* 11.4* -- -- --    Liver Function Tests:  Lab 04/01/12 0445 03/31/12 0510 03/30/12 0450 03/29/12 2143  AST -- 24 26 33  ALT -- 13 18 23   ALKPHOS -- 77 104 130*  BILITOT --  0.8 0.8 1.0  PROT -- 6.5 8.1 9.7*  ALBUMIN 2.3* 2.3* 2.8* 3.4*    Lab 03/29/12 2143  LIPASE 143*  AMYLASE --   CBC:  Lab 04/01/12 0445 03/31/12 1000 03/31/12 0510 03/29/12 2143  WBC 10.1 9.2 -- 14.7*  NEUTROABS -- -- 5.2 10.1*  HGB 9.6* 9.8* -- 13.5  HCT 28.3* 27.7* -- 36.7*  MCV 72.4* 69.6* -- 68.3*  PLT 160 195 -- 358   Differential: 10/29 Neuts - 59% Lymphs - 39% RBC morphology - polychromasia WBC morphology - atypical lymphocytes Large platelets  CBG:  Lab 03/31/12 2136 03/31/12 1705 03/31/12 0808 03/30/12 2134 03/30/12 2040  GLUCAP 160* 98 121* 123* 122*    Microbiology: Results for orders placed during the hospital encounter of 03/29/12  MRSA PCR SCREENING     Status: Normal   Collection Time   03/30/12  3:18 AM      Component Value Range Status Comment   MRSA by PCR NEGATIVE  NEGATIVE Final   URINE CULTURE     Status: Normal   Collection Time   03/30/12  3:45 AM      Component Value Range Status Comment   Specimen Description URINE, CATHETERIZED   Final    Special Requests NONE   Final    Culture  Setup Time 03/30/2012 04:33   Final    Colony Count NO GROWTH   Final    Culture NO GROWTH   Final    Report Status 03/31/2012 FINAL   Final      Medications:    Infusions:    . sodium chloride 50 mL/hr at 04/01/12 0519  . DISCONTD:  sodium bicarbonate infusion sterile water 1000 mL Stopped (03/31/12 0932)     Scheduled Medications:    . antiseptic oral rinse  15 mL Mouth Rinse BID  . atorvastatin  20 mg Oral QHS  . feeding supplement  1 Container Oral TID BM  . heparin  40 Units/kg Dialysis Once in dialysis  . heparin  40 Units/kg Dialysis Once in dialysis  . heparin  5,000 Units Subcutaneous Q8H  . insulin aspart  0-9 Units Subcutaneous TID AC & HS  . insulin glargine  10 Units Subcutaneous QHS  . multivitamin  1 tablet Oral Daily  . sodium chloride  3 mL Intravenous Q12H     PRN Medications: sodium chloride, sodium chloride, sodium  chloride, sodium chloride, acetaminophen, alteplase, alteplase, feeding supplement (NEPRO CARB STEADY), feeding supplement (NEPRO CARB STEADY), heparin, heparin, lidocaine, lidocaine, lidocaine-prilocaine, lidocaine-prilocaine, ondansetron (ZOFRAN) IV, ondansetron, pentafluoroprop-tetrafluoroeth, pentafluoroprop-tetrafluoroeth, traMADol, DISCONTD:  morphine injection    Assessment/ Plan:    Mr. Schueneman is a 61 yo gentleman with a history of type 2 diabetes on insulin, invasive adenocarcinoma of colon s/p right hemicolectomy complicated by postop intra-abdominal abscess with a leaking anastamosis, OSA, hypertension HD #3  being treated for acute renal failure receiving hemodialysis.   Acute renal failure: Creatinine 10.21, BUN 79 (baseline Cr 0.9-1)  Likely secondary to extreme volume depletion in the setting of nausea, vomiting, increased ileostomy output. On 02/05/12 he weighed 256 lbs today he weighs 213. Appears less volume depleted on exam. BMP values improving since HD. Anion gap is down to 16. Metabolic acidosis likely due to renal failure. Renal US showed no hydronephrosis, only bilateral cysts.  -Renal will continue hemodialysis his GFR has improved slightly (up to 6 mL/hr from 3)  -Continue rehydration with NS at 50cc/hr  -Strict I and O's, daily weights  -Zofran PRN  -Follow BMP  -Pt is hemodynamically stable plan to move him off the step down unit and to a regular bed  Hypokalemia: K 3.7 as of 0445 10/30  Pt was hyperkalemic at admission with ECG changes that resolved with sodium polystyrene sulfonate. Since admission his K has decreased to wnl and today is just below reference range. Likely due to GI losses and NPO status iv fluids have not had K in them.  -Has corrected since yesterday  -Monitor with BMP   Colon adenocarcinoma: S/p right hemicolectomy and ileostomy creation  WBC 10.1 as of 10/30, afebrile, hx of abdominal abscess 2/2 leaking anastamosis. CT scan abd/pelvis with  inflammation of omental fat, no focal abscess noted, no obstruction. Pt with abdominal pain around incision, no rebound or guarding. Ileostomy site appears clear, +output. Does not appear septic.  -Packed wound with fresh wet dressing at bedside, good granulation tissue trace purulence expressed with palpation less than previous examinations -ileostomy care -follow CBC, vitals  -Morphine 2-4 mg q4 hours prn for analgesia   Increased Protein Gap:  Given the patient's age, gender, race, protein gap >4, weight loss, renal failure, and recent anemia, along with his initial finding of rouleaux formation multiple myeloma should be considered.  He does have explanations for these symptoms (severe dehydration can cause rouleaux, anemia could be due to recent start of hemodialysis and has been receiving aggressive rehydration, he is not hypercalcemic, he has had multiple surgeries and decreased activity level which could contribute to weight loss) -SPEP/UPEP  Anemia: Hgb - 9.6, Hct - 28.3, MCV 72.4.  Potentially a result of HD catheter placement, HD, and aggressive fluid rehydration. -Stable from yesterday -No intervention needed at this time  Decubitus Ulcer: Grade 2 -unchanged from 10/29 -continue routine wound care -encouraged regular turns in bed  Diabetes:  -sugars have been relatively stable aberrant reading of 160 10/29 @ 2136  -hold metformin as pt with metabolic acidosis and renal failure  -continue lantus 10 units daily with correction scale  -Check blood sugar every 4 hours  -Continue renal diet but suggest low sugar  Hypotension:  Pt's BP's remain on the lower side of normal while occasionally decreasing, has held at 100's-120's/50's-60's -continue hold home carvedilol  -continue rehydration given BP's and pt's signs of dehydration on physical exam (cracked lips dry tongue)  -Increased PO intake should help this as well   Hyperlipidemia:  -Continue home atorvastatin  -Goal of LDL  <70   VTE Prophylaxis:  -heparin [place]   Diet:  -Continue renal diet but try to decrease amount of carbohydrates with meals  Dispo: -Social work is working on [employer] placement      [employer] of Stay: 3 days   This is a Careers information officer Note.  The care of the patient was discussed with Dr. Eulas Post and the assessment and plan formulated  with their assistance.  Please see their attached note or addendum for official documentation of the daily encounter.

## 2012-04-01 NOTE — Procedures (Signed)
I was present at this session.  I have reviewed the session itself and made appropriate changes. Cath ok, keeping even.  BP ok  Caige Almeda L 10/30/201311:43 AM

## 2012-04-01 NOTE — Progress Notes (Signed)
Subjective: Interval History: has complaints back is sore.  Objective: Vital signs in last 24 hours: Temp:  [97.7 F (36.5 C)-99.2 F (37.3 C)] 98.2 F (36.8 C) (10/30 0753) Pulse Rate:  [64-76] 74  (10/30 0416) Resp:  [16-21] 20  (10/30 0416) BP: (89-117)/(53-63) 101/53 mmHg (10/30 0754) SpO2:  [97 %-100 %] 100 % (10/30 0416) Weight:  [94 kg (207 lb 3.7 oz)-96.7 kg (213 lb 3 oz)] 96.7 kg (213 lb 3 oz) (10/30 0416) Weight change: 3.9 kg (8 lb 9.6 oz)  Intake/Output from previous day: 10/29 0701 - 10/30 0700 In: 1080 [P.O.:480; I.V.:600] Out: 970 [Urine:425; Stool:550] Intake/Output this shift: Total I/O In: 50 [I.V.:50] Out: 200 [Urine:200]  General appearance: alert and cooperative Resp: clear to auscultation bilaterally Cardio: S1, S2 normal and systolic murmur: holosystolic 2/6, blowing at lower left sternal border GI: ostomym wound mid abdm, pos bs Extremities: extremities normal, atraumatic, no cyanosis or edema and R fem cath  Lab Results:  Basename 04/01/12 0445 03/31/12 1000  WBC 10.1 9.2  HGB 9.6* 9.8*  HCT 28.3* 27.7*  PLT 160 195   BMET:  Basename 04/01/12 0445 03/31/12 0510  NA 133* 131*  K 3.7 3.4*  CL 91* 79*  CO2 30 32  GLUCOSE 95 131*  BUN 79* 147*  CREATININE 10.21* 15.55*  CALCIUM 7.1* 7.3*   No results found for this basename: PTH:2 in the last 72 hours Iron Studies:  Basename 03/30/12 1611  IRON 77  TIBC 211*  TRANSFERRIN --  FERRITIN --    Studies/Results: No results found.  I have reviewed the patient's current medications.  Assessment/Plan: 1 AKI some urine but little clearance.  Will plan HD today for ongoing uremia and follow closely. Keep vol even. 2 Anemia stable 3 S/P dehisc/ostomy 4 DM P hd, I&O, ns,     LOS: 3 days   [name] L 04/01/2012,9:09 AM

## 2012-04-01 NOTE — Progress Notes (Signed)
Report called to RN on 5500, pt currently finishing HD.

## 2012-04-01 NOTE — Progress Notes (Signed)
Subjective: Pt slept better overnight with improved anxiety. Feeling better this morning. Abdominal pain has improved. Started HD yesterday; c/o some back pain around "my kidneys" since HD yesterday. Uop stable. Pt hungry. Objective: Vital signs in last 24 hours: Filed Vitals:   03/31/12 2351 04/01/12 0416 04/01/12 0753 04/01/12 0754  BP: 102/55 107/57  101/53  Pulse: 74 74    Temp: 99 F (37.2 C) 98.3 F (36.8 C) 98.2 F (36.8 C)   TempSrc: Oral Oral Oral   Resp: 17 20    Height:      Weight:  213 lb 3 oz (96.7 kg)    SpO2: 98% 100%     Physical Exam: Vitals reviewed. General: Resting in bed, NAD HEENT: PERRL, EOMI, no scleral icterus Cardiac: RRR, no rubs, murmurs or gallops Pulm: Clear to auscultation bilaterally, no wheezes, rales, or rhonchi Abd: Soft, nontender, wound inact with good granulation tissue, minimal purulence expressed from inferior portion of wound and right lateral portion, nondistended, BS present, ostomy with some solid output Ext: 2+ pulses. Warm and well perfused, no pedal edema Skin: Stage 2, small decubitus ulcer, unchanged since admission. Neuro: Alert and oriented X3, cranial nerves II-XII grossly intact, strength and sensation to light touch equal in bilateral upper and lower extremities  Labs: Basic Metabolic Panel:  Lab  [id number]  03/30/12 1111  03/30/12 0450  03/29/12 2143   NA  131*  129*  127*  120*   K  3.4*  4.7  4.5  6.5*   CL  79*  83*  82*  74*   CO2  32  23  18*  16*   GLUCOSE  131*  110*  86  158*   BUN  147*  151*  158*  156*   CREATININE  15.55*  17.02*  16.82*  17.38*   CALCIUM  7.3*  8.3*  8.5  --   MG  --  --  --  --   PHOS  11.4*  --  --  --    Liver Function Tests: Lab  03/31/12 0510  03/30/12 0450  03/29/12 2143   AST  24  26  33   ALT  13  18  23    ALKPHOS  77  104  130*   BILITOT  0.8  0.8  1.0   PROT  6.5  8.1  9.7*   ALBUMIN  2.3*  2.8*  3.4*    Lab  03/29/12 2143   LIPASE  143*   AMYLASE  --     CBC: Lab  03/31/12 0510  03/29/12 2143   WBC  --  14.7*   NEUTROABS  PENDING  10.1*   HGB  --  13.5   HCT  --  36.7*   MCV  --  68.3*   PLT  --  358    CBG: Lab  03/30/12 2134  03/30/12 1508  03/30/12 1154  03/30/12 0734  03/30/12 0202   GLUCAP  123*  130*  114*  94  32    Microbiology: Results for orders placed during the hospital encounter of 03/29/12   MRSA PCR SCREENING     Status: Normal     Collection Time     03/30/12  3:18 AM       Component  Value  Range  Status  Comment     MRSA by PCR  NEGATIVE   NEGATIVE  Final     URINE CULTURE  Status: Normal     Collection Time     03/30/12  3:45 AM       Component  Value  Range  Status  Comment     Specimen Description  URINE, CATHETERIZED     Final       Special Requests  NONE     Final       Culture  Setup Time  03/30/2012 04:33     Final       Colony Count  NO GROWTH     Final       Culture  NO GROWTH     Final       Report Status  03/31/2012 FINAL     Final      Imaging: Ct Abdomen Pelvis W Contrast  03/29/2012  *RADIOLOGY REPORT*  Clinical Data: Status post fall.  History of intra-abdominal abscess secondary to the anastomotic leak.  History of colon cancer.  CT ABDOMEN AND PELVIS WITH CONTRAST  Technique:  Multidetector CT imaging of the abdomen and pelvis was performed following the standard protocol during bolus administration of intravenous contrast.  Contrast: 151mL OMNIPAQUE IOHEXOL 300 MG/ML  SOLN  Comparison: Chest and two views abdomen 02/16/2012.  Findings: The lung bases are clear.  There is no pleural or pericardial effusion.  The patient has a midline surgical wound.  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  The patient is status post right hemicolectomy.  There is no abscess.  There is extensive infiltration of omental fat, particularly on the left.  There is no rim enhancing fluid collection although there are some small foci of fluid density material present.  No evidence of small bowel obstruction  is present.  The stomach appears normal.  A few small stones are seen layering dependently in the gallbladder but there is no evidence of cholecystitis.  The liver, adrenal glands, spleen and pancreas appear normal.  Low attenuating lesions in both kidneys are most consistent with cysts. There are lymph nodes in the porta hepatis measuring up to 1.8 cm on image 21. Foley catheter is in place urinary bladder with an air-fluid level identified.  No focal bony abnormality is seen with extensive degenerative disease present about the hips.  IMPRESSION:  1.  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 2.  Extensive infiltration of omental fat, worse on the left, could be due to infectious or inflammatory process or postoperative change.  Omental tumor implants are possible but felt somewhat less likely.  No abscess is identified. 3.  Fatty infiltration of the liver. 4.  Small lymph node in the porta hepatis is nonspecific and likely reactive.  Recommend attention on follow-up exams. 5.  Small gallstones without evidence of cholecystitis.   Original Report Authenticated By: Arvid Right. Luther Parody, M.D.     US Renal 03/30/2012  *RADIOLOGY REPORT*  Clinical Data: Acute renal failure.  RENAL/URINARY TRACT ULTRASOUND COMPLETE  Comparison:  CT scan dated 05/29/2012  Findings:  Right Kidney:  13.4 cm in length.  Several small cysts in the right kidney.  No hydronephrosis. Slightly echogenic renal parenchyma, equal to the liver.  Left Kidney:  12.6 cm in length.  3.3 cm oblong cyst in the upper pole.  No hydronephrosis.  Bladder: Almost empty.  IMPRESSION:  1.  No hydronephrosis.  Slight increased echogenicity of the renal parenchyma consistent with renal medical disease. 2.  Small benign-appearing cysts on both kidneys.   Original Report Authenticated By:  Larey Seat, M.D.        Medications: I have reviewed the patient's current medications. Scheduled Meds:  .  antiseptic oral rinse  15 mL Mouth Rinse BID  . atorvastatin  20 mg Oral QHS  . feeding supplement  1 Container Oral TID BM  . heparin  5,000 Units Subcutaneous Q8H  . insulin aspart      . insulin aspart  0-9 Units Subcutaneous TID AC & HS  . insulin glargine  10 Units Subcutaneous QHS  . sodium chloride  3 mL Intravenous Q12H  . DISCONTD: carvedilol  6.25 mg Oral BID WC  . DISCONTD: insulin aspart  0-9 Units Subcutaneous Q4H  . DISCONTD: sodium chloride  1,000 mL Intravenous Once   Continuous Infusions: .  sodium bicarbonate infusion sterile water 1000 mL 250 mL/hr at 03/30/12 0416   Followed by  .  sodium bicarbonate infusion sterile water 1000 mL 150 mL/hr at 03/31/12 0248   PRN Meds:.sodium chloride, sodium chloride, sodium chloride, sodium chloride, acetaminophen, alteplase, alteplase, feeding supplement (NEPRO CARB STEADY), feeding supplement (NEPRO CARB STEADY), heparin, heparin, lidocaine, lidocaine, lidocaine-prilocaine, lidocaine-prilocaine, ondansetron (ZOFRAN) IV, ondansetron, pentafluoroprop-tetrafluoroeth, pentafluoroprop-tetrafluoroeth, traMADol, DISCONTD: alum & mag hydroxide-simeth, DISCONTD:  morphine injection  Assessment/Plan:  1. ARF: Creatinine 18 on admission, and pt reported no uop in at least 4 days. He also had increased ostomy output and emesis over the week prior to admission. Unfortunately a CT abd/pelvis with contrast was obtained in the ED, which likely worsened his renal failure. Renal was consulted and pt aggressively hydrated with bicarb in sterile water per Nephrology, which was then changed to normal saline. HD started yesterday and will receive again today. Cr 10.2 this morning, and uop remains stable.  - HD today  - Labs per Nephrology - Monitor uop - NS@50cc /hr  2. Colon adenocarcinoma s/p right hemicolectomy: Pt with unfortunate anastomotic leak with intraabdominal abscess formation s/p right hemicolectomy in September. He was taken back to the OR  for multiple washouts, and his abdomen was closed on 02/26/12. He had a wound vac in place, which was removed at d/c on 03/10/12. The lower half of the wound healed, but the upper half is still healing and is open superficially with visible fascial sutures. Fibrinous material is present in the wound on admission to the hospital for ARF. Possible some mild purulence in the inferior portion of the wound. General Surgery does not feel that the pt requires abx. Wound Care is also on board and is following. Wound looks good this morning and appears to be healing well with good granulation tissue, minimal purulence. - Continue wound care per General Surgery and Wound Care  3. Hyperkalemia: Potassium 6.5 on admission, but pt very dehydrated. After IVF started, am BMP with K+ 4.5 --> 4.7 --> 3.4 -->3.7. Will continue to hydrate and monitor potassium levels.  - Labs per Nephrology - HD today  4. Stage 2 decubitus ulcer: Present on admission. Wound Care consulted - F/u with Wound Care recs  5. DVT PPx: [place] Heparin    LOS: 3 days   Otho Bellows 04/01/2012, 9:09 AM

## 2012-04-01 NOTE — Progress Notes (Signed)
Resident Co-sign Daily Note: I have seen the patient and reviewed the daily progress note by Elmer Sow and discussed the care of the patient with him.  See my separate note for documentation of my findings, assessment, and plans.    LOS: 3 days   Otho Bellows 04/01/2012, 12:56 PM

## 2012-04-01 NOTE — Consult Note (Signed)
WOC consult Note Reason for Consult: re-consulted for Stage II, coccyx.  Pt has fissure in the gluteal skin folds, most likely related to moisture.  Silicone foam dressing will be adequate for this, must be placed down in the skin fold however, not over the skin fold.   Wound type: fissure apex of gluteal crease, moisture related Measurement: 3cm x 0.5cm , has some scarring /re epithelialization, measured only broken skin  Wound [id number], pink Drainage (amount, consistency, odor) moderate, dark brown Periwound: intact, slightly macerated Dressing procedure/placement/frequency: continue silicone foam dressings.   Re consult if needed, will not follow at this time. Thanks  Patrisia Faeth [place], [place] [phone])

## 2012-04-01 NOTE — Progress Notes (Signed)
Internal Medicine Teaching Service Attending Note Date: 04/01/2012  Patient name: [name]  Medical record number: [id number]  Date of birth: [birth year]   I have seen and evaluated Theodore A Beverely Low and discussed their care with the Residency Team.  Without complaints     . antiseptic oral rinse  15 mL Mouth Rinse BID  . atorvastatin  20 mg Oral QHS  . feeding supplement  1 Container Oral TID BM  . heparin  40 Units/kg Dialysis Once in dialysis  . heparin  40 Units/kg Dialysis Once in dialysis  . heparin  5,000 Units Subcutaneous Q8H  . insulin aspart  0-9 Units Subcutaneous TID AC & HS  . insulin glargine  10 Units Subcutaneous QHS  . multivitamin  1 tablet Oral Daily  . sodium chloride  3 mL Intravenous Q12H    Physical Exam: Blood pressure 101/53, pulse 74, temperature 98.2 F (36.8 C), temperature source Oral, resp. rate 20, height 6' (1.829 m), weight 96.7 kg (213 lb 3 oz), SpO2 100.00%. General appearance: alert, cooperative and no distress Resp: rhonchi bilaterally and mild Cardio: regular rate and rhythm GI: normal findings: bowel sounds normal and soft, non-tender R femoral HD line Mid-abd wound dressed, clean.   Lab results: Results for orders placed during the hospital encounter of 03/29/12 (from the past 24 hour(s))  CBC     Status: Abnormal   Collection Time   03/31/12 10:00 AM      Component Value Range   WBC 9.2  4.0 - 10.5 K/uL   RBC 3.98 (*) 4.22 - 5.81 MIL/uL   Hemoglobin 9.8 (*) 13.0 - 17.0 g/dL   HCT 27.7 (*) 39.0 - 52.0 %   MCV 69.6 (*) 78.0 - 100.0 fL   MCH 24.6 (*) 26.0 - 34.0 pg   MCHC 35.4  30.0 - 36.0 g/dL   RDW 17.7 (*) 11.5 - 15.5 %   Platelets 195  150 - 400 K/uL  HEPATITIS B SURFACE ANTIBODY     Status: Normal   Collection Time   03/31/12 12:01 PM      Component Value Range   Hep B S Ab NEGATIVE  NEGATIVE  GLUCOSE, CAPILLARY     Status: Normal   Collection Time   03/31/12  5:05 PM      Component Value Range   Glucose-Capillary 98  70 - 99 mg/dL   Comment 1 Notify RN     Comment 2 Documented in Chart    GLUCOSE, CAPILLARY     Status: Abnormal   Collection Time   03/31/12  9:36 PM      Component Value Range   Glucose-Capillary 160 (*) 70 - 99 mg/dL   Comment 1 Notify RN     Comment 2 Documented in Chart    RENAL FUNCTION PANEL     Status: Abnormal   Collection Time   04/01/12  4:45 AM      Component Value Range   Sodium 133 (*) 135 - 145 mEq/L   Potassium 3.7  3.5 - 5.1 mEq/L   Chloride 91 (*) 96 - 112 mEq/L   CO2 30  19 - 32 mEq/L   Glucose, Bld 95  70 - 99 mg/dL   BUN 79 (*) 6 - 23 mg/dL   Creatinine, Ser 10.21 (*) 0.50 - 1.35 mg/dL   Calcium 7.1 (*) 8.4 - 10.5 mg/dL   Phosphorus 6.2 (*) 2.3 - 4.6 mg/dL   Albumin 2.3 (*) 3.5 - 5.2 g/dL  GFR calc non Af Amer 5 (*) >90 mL/min   GFR calc Af Amer 6 (*) >90 mL/min  CBC     Status: Abnormal   Collection Time   04/01/12  4:45 AM      Component Value Range   WBC 10.1  4.0 - 10.5 K/uL   RBC 3.91 (*) 4.22 - 5.81 MIL/uL   Hemoglobin 9.6 (*) 13.0 - 17.0 g/dL   HCT 28.3 (*) 39.0 - 52.0 %   MCV 72.4 (*) 78.0 - 100.0 fL   MCH 24.6 (*) 26.0 - 34.0 pg   MCHC 33.9  30.0 - 36.0 g/dL   RDW 17.9 (*) 11.5 - 15.5 %   Platelets 160  150 - 400 K/uL    Imaging results:  No results found.  Assessment and Plan: I agree with the formulated Assessment and Plan with the following changes:   ARF Colon CA Decubitus  Appreciate renal f/u, for HD today Appreciate surgical f/u WOC eval Check HIV

## 2012-04-01 NOTE — Progress Notes (Addendum)
Patient transferred from 3300 via Hemodialysis. He is A&O x 4 coming in from [facility]. He has a stage II on sacral area with foam dressing. Dressing replaced d/t being soiled.  Patient has midline dressing that is CDI. Statlock placed for foley.

## 2012-04-02 DIAGNOSIS — K929 Disease of digestive system, unspecified: Secondary | ICD-10-CM

## 2012-04-02 LAB — CBC
HCT: 27.3 % — ABNORMAL LOW (ref 39.0–52.0)
Hemoglobin: 9.2 g/dL — ABNORMAL LOW (ref 13.0–17.0)
MCH: 24.7 pg — ABNORMAL LOW (ref 26.0–34.0)
MCHC: 33.7 g/dL (ref 30.0–36.0)
MCV: 73.2 fL — ABNORMAL LOW (ref 78.0–100.0)
Platelets: 140 10*3/uL — ABNORMAL LOW (ref 150–400)
RBC: 3.73 MIL/uL — ABNORMAL LOW (ref 4.22–5.81)
RDW: 18.1 % — ABNORMAL HIGH (ref 11.5–15.5)
WBC: 12.2 10*3/uL — ABNORMAL HIGH (ref 4.0–10.5)

## 2012-04-02 LAB — GLUCOSE, CAPILLARY
Glucose-Capillary: 106 mg/dL — ABNORMAL HIGH (ref 70–99)
Glucose-Capillary: 187 mg/dL — ABNORMAL HIGH (ref 70–99)
Glucose-Capillary: 142 mg/dL — ABNORMAL HIGH (ref 70–99)
Glucose-Capillary: 116 mg/dL — ABNORMAL HIGH (ref 70–99)

## 2012-04-02 LAB — RENAL FUNCTION PANEL
Albumin: 2.2 g/dL — ABNORMAL LOW (ref 3.5–5.2)
BUN: 44 mg/dL — ABNORMAL HIGH (ref 6–23)
CO2: 27 mEq/L (ref 19–32)
Calcium: 6.9 mg/dL — ABNORMAL LOW (ref 8.4–10.5)
Chloride: 99 mEq/L (ref 96–112)
Creatinine, Ser: 6.5 mg/dL — ABNORMAL HIGH (ref 0.50–1.35)
GFR calc Af Amer: 10 mL/min — ABNORMAL LOW (ref >90)
GFR calc non Af Amer: 8 mL/min — ABNORMAL LOW (ref >90)
Glucose, Bld: 97 mg/dL (ref 70–99)
Phosphorus: 3.5 mg/dL (ref 2.3–4.6)
Potassium: 3.7 mEq/L (ref 3.5–5.1)
Sodium: 135 mEq/L (ref 135–145)

## 2012-04-02 LAB — UIFE/LIGHT CHAINS/TP QN, 24-HR UR
Albumin, U: DETECTED
Alpha 1, Urine: DETECTED — AB
Alpha 2, Urine: DETECTED — AB
Beta, Urine: DETECTED — AB
Free Kappa Lt Chains,Ur: 72.6 mg/dL — ABNORMAL HIGH (ref 0.14–2.42)
Free Kappa/Lambda Ratio: 3.52 ratio (ref 2.04–10.37)
Free Lambda Lt Chains,Ur: 20.6 mg/dL — ABNORMAL HIGH (ref 0.02–0.67)
Gamma Globulin, Urine: DETECTED — AB
Total Protein, Urine: 111.7 mg/dL

## 2012-04-02 LAB — URINALYSIS, ROUTINE W REFLEX MICROSCOPIC
Bilirubin Urine: NEGATIVE
Glucose, UA: 100 mg/dL — AB
Ketones, ur: NEGATIVE mg/dL
Nitrite: NEGATIVE
Protein, ur: 100 mg/dL — AB
Specific Gravity, Urine: 1.016 (ref 1.005–1.030)
Urobilinogen, UA: 0.2 mg/dL (ref 0.0–1.0)
pH: 6 (ref 5.0–8.0)

## 2012-04-02 LAB — PROTEIN ELECTROPHORESIS, SERUM
Albumin ELP: 38.5 % — ABNORMAL LOW (ref 55.8–66.1)
Alpha-1-Globulin: 5.1 % — ABNORMAL HIGH (ref 2.9–4.9)
Alpha-2-Globulin: 13.9 % — ABNORMAL HIGH (ref 7.1–11.8)
Beta 2: 6.5 % (ref 3.2–6.5)
Beta Globulin: 4.4 % — ABNORMAL LOW (ref 4.7–7.2)
Gamma Globulin: 31.6 % — ABNORMAL HIGH (ref 11.1–18.8)
M-Spike, %: NOT DETECTED g/dL
Total Protein ELP: 6 g/dL (ref 6.0–8.3)

## 2012-04-02 LAB — URINE MICROSCOPIC-ADD ON

## 2012-04-02 LAB — HIV ANTIBODY (ROUTINE TESTING W REFLEX): HIV: NONREACTIVE

## 2012-04-02 MED ORDER — LOPERAMIDE HCL 2 MG PO CAPS: 2.0000 mg | ORAL_CAPSULE | Freq: Three times a day (TID) | ORAL | Status: DC | PRN

## 2012-04-02 MED ORDER — VANCOMYCIN HCL 1000 MG IV SOLR
2000.0000 mg | Freq: Once | INTRAVENOUS | Status: AC
  Administered 2012-04-02: 2000 mg via INTRAVENOUS
  Filled 2012-04-02: qty 2000

## 2012-04-02 MED ORDER — SODIUM CHLORIDE 0.9 % IV SOLN
1.5000 g | Freq: Two times a day (BID) | INTRAVENOUS | Status: DC
  Administered 2012-04-02 – 2012-04-03 (×4): 1.5 g via INTRAVENOUS
  Filled 2012-04-02 (×5): qty 1.5

## 2012-04-02 NOTE — Progress Notes (Signed)
Physical Therapy Treatment Patient Details Name: [name] MRN: [id number] DOB: 1951/01/25 Today's Date: 04/02/2012 Time: [id number] PT Time Calculation (min): 20 min  PT Assessment / Plan / Recommendation Comments on Treatment Session  Admitted with acute renal failure; Pt. did not become orthostatic with activity this tx. However, pt. wanted to sit after <1 of standing at bed side. Pt. stated he "felt like he needed to sit" but did not complain of dizziness. Mobility is currently limited by temporary femoral catheter and pt has difficulty following precautions.                      Plan Discharge plan remains appropriate;Frequency remains appropriate    Precautions / Restrictions Precautions Precautions: Other (comment) Precaution Comments: Femoral catheter in RLE; No hip flexion past 90 degrees Restrictions Weight Bearing Restrictions: No   Pertinent Vitals/Pain BP was 122/72 when lying flat in bed; 121/68 when sitting EOB; 142/70 after transfering to chair.    Mobility  Bed Mobility Sitting - Scoot to Edge of Bed: 5: Supervision Details for Bed Mobility Assistance: Pt. required supervision for safety. Transfers Transfers: Stand Pivot Transfers Sit to Stand: 4: Min assist;With upper extremity assist Stand to Sit: 4: Min assist Stand Pivot Transfers: 4: Min guard Details for Transfer Assistance: Pt. requires minA with stand to sit and sit to stand for safety, cueing for sequencing, and due to difficulty with keeping R knee extended. Pt. requires min guard with stand pivot for safety and cueing for femoral catheter percautions. Pt. had difficulty standing without flexing his hip past 90 degrees therefore PT had to block RLE in extension when standing. Ambulation/Gait Ambulation/Gait Assistance: Not tested (comment) (Unable to ambulate due to femoral catheter.) Stairs: No    Exercises General Exercises - Lower Extremity Long Arc Quad: AROM;Both;20 reps;Seated Hip  Flexion/Marching: AROM;Left;20 reps;Seated     PT Goals Acute Rehab PT Goals PT Goal: Sit to Stand - Progress: Progressing toward goal PT Goal: Stand to Sit - Progress: Progressing toward goal PT Transfer Goal: Bed to Chair/Chair to Bed - Progress: Progressing toward goal  Visit Information  Last PT Received On: 04/02/12 Assistance Needed: +1    Subjective Data  Subjective: My catheter is in the R leg   Cognition  Overall Cognitive Status: Impaired Area of Impairment: Following commands;Safety/judgement (Pt. had difficulty following femoral catheter percautions) Arousal/Alertness: Awake/alert Orientation Level: Appears intact for tasks assessed Behavior During Session: [name] for tasks performed       End of Session PT - End of Session Activity Tolerance: Patient tolerated treatment well Patient left: in chair;with call bell/phone within reach;with nursing in room Nurse Communication: Mobility status     Earma Reading SPT 04/02/2012, 1:40 PM

## 2012-04-02 NOTE — Progress Notes (Signed)
  Subjective: Patient transferred to floor Making some urine Creatinine improving - 6.5 today Ostomy output - brownish, but still quite thin  Objective: Vital signs in last 24 hours: Temp:  [98.1 F (36.7 C)-101.2 F (38.4 C)] 99.9 F (37.7 C) (10/31 [id number]) Pulse Rate:  [72-93] 90  (10/31 0537) Resp:  [13-22] 18  (10/31 0537) BP: (106-139)/(64-81) 120/64 mmHg (10/31 0537) SpO2:  [96 %-100 %] 96 % (10/31 0537) Weight:  [213 lb 10 oz (96.9 kg)-216 lb 4.3 oz (98.1 kg)] 216 lb 4.3 oz (98.1 kg) (10/31 0537) Last BM Date: 04/01/12  Intake/Output from previous day: 10/30 0701 - 10/31 0700 In: 2006.7 [P.O.:800; I.V.:1206.7] Out: 1432 [Urine:680; Stool:750] Intake/Output this shift: Total I/O In: -  Out: 50 [Urine:50]  General appearance: alert, cooperative and no distress Abd - soft, nontender; ileostomy - pink viable Wound - granulating with fibrinous exudate at top and bottom of wound.  Lab Results:   [facility] 04/02/12 0549 04/01/12 0445  WBC 12.2* 10.1  HGB 9.2* 9.6*  HCT 27.3* 28.3*  PLT 140* 160   BMET  Basename 04/02/12 0549 04/01/12 0445  NA 135 133*  K 3.7 3.7  CL 99 91*  CO2 27 30  GLUCOSE 97 95  BUN 44* 79*  CREATININE 6.50* 10.21*  CALCIUM 6.9* 7.1*   PT/INR No results found for this basename: LABPROT:2,INR:2 in the last 72 hours ABG No results found for this basename: PHART:2,PCO2:2,PO2:2,HCO3:2 in the last 72 hours  Studies/Results: No results found.  Anti-infectives: Anti-infectives    None      Assessment/Plan:  Very loose ileostomy output - probably cause of ARF Will start imodium to try to thicken ileostomy output Continue dressing changes Antibiotics per Dr. Johnnye Sima - leukocytosis  LOS: 4 days    Rodney Mellinger K. 04/02/2012

## 2012-04-02 NOTE — Progress Notes (Signed)
Notified Oncall MD that patient's temp was 101.2, patient stated that he thought it was because his room was warm. Rechecked temp after turning down thermostat in room, temp now is 99.9. Patient is voiding small amounts after d/c foley last night. Voided 3 times: 100cc, 30cc, 50cc. Bladder scanner showed 0cc of urine in bladder. Patient having hiccups this morning. Will continue to monitor patient. Hassan Buckler

## 2012-04-02 NOTE — Progress Notes (Signed)
Patient ID: [name], male   DOB: January 18, 1951, 61 y.o.   MRN: [id number] Medical Student Daily Progress Note   Subjective:    Interval Events:   Had hemodialysis 10/30 with a temporary catheter with no difficulties.  Pt was transferred off the step down unit yesterday to 5500.  Foley catheter was dc'd 10/30 @2100  and pt has been able to urinate small amounts he said the first urination came easily but he has had some difficulty initiating a stream with subsequent voids.  This morning he spiked a fever to 101.2 which came down to 99.9 after lowering the temperature of the room.  He is still complaining of baseline sore throat and dry mouth, he has mouth swabs at the bedside.  New complaint today of hiccups he said they started yesterday and have not stopped since  (he hiccupped throughout the entire interview and physical).  Pt denies fever, chills, HA, dizziness,  SOB, chest pain, weakness, numbness, and tingling in extremities.    Objective:    Vital Signs:   Temp:  [98.1 F (36.7 C)-101.2 F (38.4 C)] 99.9 F (37.7 C) (10/31 [id number]) Pulse Rate:  [72-93] 90  (10/31 0537) Resp:  [13-22] 18  (10/31 0537) BP: (101-139)/(53-81) 120/64 mmHg (10/31 0537) SpO2:  [96 %-100 %] 96 % (10/31 0537) Weight:  [96.9 kg (213 lb 10 oz)-98.1 kg (216 lb 4.3 oz)] 98.1 kg (216 lb 4.3 oz) (10/31 0537) Last BM Date: 04/01/12   Weights: 24-hour Weight change: 2.9 kg (6 lb 6.3 oz)  Filed Weights   04/01/12 1119 04/01/12 1435 04/02/12 0537  Weight: 96.9 kg (213 lb 10 oz) 97.1 kg (214 lb 1.1 oz) 98.1 kg (216 lb 4.3 oz)   Net since admission: 1.2 kg  Intake/Output:   Intake/Output Summary (Last 24 hours) at 04/02/12 0750 Last data filed at 04/02/12 0724  Gross per 24 hour  Intake 1956.67 ml  Output   1482 ml  Net 474.67 ml     Net since admission:  2467 mL  Physical Exam: GENERAL:  alert and oriented; resting comfortably in bed and in no distress; hiccupping constantly EYES:  Pupils small but  equal, round, and reactive to light; sclera anicteric slightly injected, conjunctiva appear pale ENT:  Cracked lips, moist mucous membranes, throat is clean and non-erythematous NECK:  no JVD, mobile non tender anterior cervical lymph nodes palpated LUNGS:  clear to auscultation bilaterally, normal work of breathing HEART:  normal rate; regular rhythm; normal S1 and S2, no S3 or S4 appreciated; Grade I-II/VI systolic murmur best heard at upper right sternal border, no rubs or clicks ABDOMEN:  soft, non-tender, normal bowel sounds, no masses palpated, 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 unchanged with some liquid stool present, superficially open vertical wound approximately 5 centimeters in length with good granulation tissue intact fascial sutures seen, slight pooling of yellow green liquid at inferior aspect of wound BACK: Grade 2 decubitus ulcer noted at sacrum with bandage in place, appears less erythematous than yesterday EXTREMITIES:  No peripheral edema, dry skin noted on shins bilaterally, 2+ pulses dorsalis pedis and radial NEURO: EOMI,  palate rises symmetrically, sensation to light touch intact throughout GENITALS: Foley catheter has been removed SKIN:  normal turgor   Labs: Basic Metabolic Panel:  Lab [id number] 0549 04/01/12 0445 03/31/12 0510 03/30/12 1111 03/30/12 0450  NA 135 133* 131* 129* 127*  K 3.7 3.7 3.4* 4.7 4.5  CL 99 91* 79* 83* 82*  CO2 27 30 32 23 18*  GLUCOSE 97 95 131* 110* 86  BUN 44* 79* 147* 151* 158*  CREATININE 6.50* 10.21* 15.55* 17.02* 16.82*  CALCIUM 6.9* 7.1* 7.3* -- --  MG -- -- -- -- --  PHOS 3.5 6.2* 11.4* -- --    Liver Function Tests:  Lab 04/02/12 0549 04/01/12 0445 03/31/12 0510 03/30/12 0450 03/29/12 2143  AST -- -- 24 26 33  ALT -- -- 13 18 23   ALKPHOS -- -- 77 104 130*  BILITOT -- -- 0.8 0.8 1.0  PROT -- -- 6.5 8.1 9.7*  ALBUMIN 2.2* 2.3* 2.3* 2.8* 3.4*    Lab 03/29/12 2143  LIPASE 143*  AMYLASE --   CBC:  Lab 04/02/12 0549 04/01/12  0445 03/31/12 1000 03/31/12 0510 03/29/12 2143  WBC 12.2* 10.1 9.2 -- 14.7*  NEUTROABS -- -- -- 5.2 10.1*  HGB 9.2* 9.6* 9.8* -- 13.5  HCT 27.3* 28.3* 27.7* -- 36.7*  MCV 73.2* 72.4* 69.6* -- 68.3*  PLT 140* 160 195 -- 358   Differential: 10/29 Neuts - 59% Lymphs - 39% RBC morphology - polychromasia WBC morphology - atypical lymphocytes Large platelets  CBG:  Lab 04/01/12 2126 04/01/12 1657 04/01/12 0751 03/31/12 2136 03/31/12 1705  GLUCAP 117* 105* 127* 160* 49    Microbiology: Results for orders placed during the hospital encounter of 03/29/12  MRSA PCR SCREENING     Status: Normal   Collection Time   03/30/12  3:18 AM      Component Value Range Status Comment   MRSA by PCR NEGATIVE  NEGATIVE Final   URINE CULTURE     Status: Normal   Collection Time   03/30/12  3:45 AM      Component Value Range Status Comment   Specimen Description URINE, CATHETERIZED   Final    Special Requests NONE   Final    Culture  Setup Time 03/30/2012 04:33   Final    Colony Count NO GROWTH   Final    Culture NO GROWTH   Final    Report Status 03/31/2012 FINAL   Final      Medications:    Infusions:    . sodium chloride 50 mL/hr at 04/02/12 0008     Scheduled Medications:    . antiseptic oral rinse  15 mL Mouth Rinse BID  . atorvastatin  20 mg Oral QHS  . feeding supplement  1 Container Oral TID BM  . heparin  40 Units/kg Dialysis Once in dialysis  . heparin  5,000 Units Subcutaneous Q8H  . insulin aspart  0-9 Units Subcutaneous TID AC & HS  . insulin glargine  10 Units Subcutaneous QHS  . multivitamin  1 tablet Oral Daily  . sodium chloride  3 mL Intravenous Q12H     PRN Medications: sodium chloride, sodium chloride, sodium chloride, sodium chloride, acetaminophen, alteplase, feeding supplement (NEPRO CARB STEADY), feeding supplement (NEPRO CARB STEADY), heparin, heparin, lidocaine, lidocaine, lidocaine-prilocaine, lidocaine-prilocaine, ondansetron (ZOFRAN) IV,  ondansetron, pentafluoroprop-tetrafluoroeth, pentafluoroprop-tetrafluoroeth, traMADol    Assessment/ Plan:    [name] is a 61 yo gentleman with a history of type 2 diabetes on insulin, invasive adenocarcinoma of colon s/p right hemicolectomy complicated by postop intra-abdominal abscess with a leaking anastamosis, OSA, hypertension HD #4 being treated for acute renal failure.   Acute renal failure: Creatinine 10.21, BUN 79 (baseline Cr 0.9-1)  Likely secondary to extreme volume depletion in the setting of nausea, vomiting, increased ileostomy output. On 02/05/12 he weighed 256 lbs today he weighs 213. Appears less volume  depleted on exam. BMP values improving since HD. Anion gap is down to 14. Metabolic acidosis likely due to renal failure. Renal US showed no hydronephrosis, only bilateral cysts.  -Renal did not suggest hemodialysis today given improvement of his metabolic acidosis and his ability to make urine -Continue fluids NS at 50cc/hr  -Strict I and O's, daily weights  -Zofran PRN  -Follow BMP   Leukocytosis:  Pt's WBC count has increased to 12.2 and spiked a temp to 101.2.  Potential sources of infection include indwelling foley catheter (dc'd 10/31), and an abdominal source given his ileostomy and multiple bowel surgeries. -Repeat urine analysis and culture -Draw blood cultures -Begin empiric therapy with ampicillin/sulbactam and vancomycin  Hypokalemia: K 3.7 as of 10/31  Pt was hyperkalemic at admission with ECG changes that resolved with sodium polystyrene sulfonate. Since admission his K has decreased to wnl and today is just below reference range. Likely due to GI losses and NPO status iv fluids have not had K in them.  -Has since resolved  Colon adenocarcinoma: S/p right hemicolectomy and ileostomy creation  WBC 12.1 as of 10/31, temperature spike to 101.2  10/31, hx of abdominal abscess 2/2 leaking anastamosis. CT scan abd/pelvis with inflammation of omental fat, no focal  abscess noted, no obstruction. Pt with abdominal pain around incision, no rebound or guarding. Ileostomy site appears clear, +output. Does not appear septic.  -Continue wound care with daily wet to dry dressings -Given continued loose stools from ileostomy start imodium -follow CBC, vitals  -Acetaminophen and tramadol prn for analgesia  Increased Protein Gap:  Given the patient's age, gender, race, protein gap >4, weight loss, renal failure, and recent anemia, along with his initial finding of rouleaux formation multiple myeloma should be considered.  He does have explanations for these symptoms (severe dehydration can cause rouleaux, anemia could be due to recent start of hemodialysis and has been receiving aggressive rehydration, he is not hypercalcemic, he has had multiple surgeries and decreased activity level which could contribute to weight loss) -Free Kappa increased at 72.6, Free Lambda increased to 20.6, ratio normal at 3.52, no evidence of a monoclonal spike -Other SPEP/UPEP results pending -Unlikely to be multiple myeloma given these results  Anemia: Hgb - 9.2, Hct - 27.3, MCV 73.2, platelets 140.  Potentially a result of HD catheter placement, HD, and aggressive fluid rehydration, not fully clear on etiology.  SPEP/UPEP does not show monoclonal spike. -Slightly down from yesterday -No intervention needed at this time  Decubitus Ulcer: Grade 2 -Appears improved from 10/30 -continue routine wound care -encouraged regular turns in bed to offload -Maintain nutrition with regular meals and supplementation  Diabetes:  -sugars have been relatively stable aberrant reading of 160 10/29 @ 2136  -hold metformin as pt with metabolic acidosis and renal failure  -continue lantus 10 units daily with correction scale  -Check blood sugar every 4 hours  -Continue renal diet but suggest low sugar  Hypotension:  Pt's BP's normal throughout hemodialysis and have remained normal today -NS  running at 50 cc/hr -continue PO intake  Hyperlipidemia:  -Continue home atorvastatin  -Goal of LDL <70   VTE Prophylaxis:  -heparin McDonald   Diet:  -Continue renal diet but try to decrease amount of carbohydrates with meals -Pt fluid restricted to 1200 mL per day  Dispo: -Plan to dc back to [place] when pt is medically stable      Length of Stay: 4 days   This is a Careers information officer Note.  The care of the patient was discussed with Dr. Eulas Post and the assessment and plan formulated with their assistance.  Please see their attached note or addendum for official documentation of the daily encounter.

## 2012-04-02 NOTE — Progress Notes (Signed)
Resident Co-sign Daily Note: I have seen the patient and reviewed the daily progress note by Dyann Kief, MS-III and discussed the care of the patient with him.  See my separate note for documentation of my findings, assessment, and plans.     LOS: 4 days   Otho Bellows 04/02/2012, 6:28 PM

## 2012-04-02 NOTE — Progress Notes (Deleted)
Physical Therapy Treatment Patient Details Name: [name] MRN: [id number] DOB: November 19, 1950 Today's Date: 04/02/2012 Time: [id number] PT Time Calculation (min): 20 min  PT Assessment / Plan / Recommendation Comments on Treatment Session  Admitted with acute renal failure; Pt. did not become orthostatic with activity this tx. However, pt. wanted to sit after <1 of standing at bed side. Pt. stated he "felt like he needed to sit" but did not complain of dizziness. Mobility is currently limited by temporary femoral catheter and pt has difficulty following precautions.                      Plan Discharge plan remains appropriate;Frequency remains appropriate    Precautions / Restrictions Precautions Precautions: Other (comment) Precaution Comments: Femoral catheter in RLE; No hip flexion past 90 degrees Restrictions Weight Bearing Restrictions: No   Pertinent Vitals/Pain BP was 122/72 when lying flat in bed; 121/68 when sitting EOB; 142/70 after transfering to chair.    Mobility  Bed Mobility Sitting - Scoot to Edge of Bed: 5: Supervision Details for Bed Mobility Assistance: Pt. required supervision for safety. Transfers Transfers: Programmer, applications Transfers: 4: Min guard Details for Transfer Assistance: Pt. required min guard for safety and cueing for tremporary femoral catheter percautions. Pt. had difficulty standing without flexing his hip past 90 degrees therefore PT had to block RLE in extension when standing.  Ambulation/Gait Ambulation/Gait Assistance: Not tested (comment) (Unable to ambulate due to femoral catheter.) Stairs: No    Exercises General Exercises - Lower Extremity Long Arc Quad: AROM;Both;20 reps;Seated Hip Flexion/Marching: AROM;Left;20 reps;Seated    PT Goals Acute Rehab PT Goals PT Goal: Sit to Stand - Progress: Progressing toward goal PT Goal: Stand to Sit - Progress: Progressing toward goal PT Transfer Goal: Bed to  Chair/Chair to Bed - Progress: Progressing toward goal  Visit Information  Last PT Received On: 04/02/12 Assistance Needed: +1    Subjective Data  Subjective: My catheter is in the R leg   Cognition  Overall Cognitive Status: Impaired Area of Impairment: Following commands;Safety/judgement (Pt. had difficulty following femoral catheter percautions) Arousal/Alertness: Awake/alert Orientation Level: Appears intact for tasks assessed Behavior During Session: [facility] for tasks performed       End of Session PT - End of Session Activity Tolerance: Patient tolerated treatment well Patient left: in chair;with call bell/phone within reach;with nursing in room Nurse Communication: Mobility status      Earma Reading SPT 04/02/2012, 1:17 PM

## 2012-04-02 NOTE — Progress Notes (Signed)
Seen and agree with SPT note Darius Fillingim Tabor Ledarrius Beauchaine, PT [phone]  

## 2012-04-02 NOTE — Progress Notes (Signed)
Subjective: Interval History: has complaints passing urine, just not much.  Objective: Vital signs in last 24 hours: Temp:  [98.1 F (36.7 C)-101.2 F (38.4 C)] 99.9 F (37.7 C) (10/31 [id number]) Pulse Rate:  [72-93] 90  (10/31 0537) Resp:  [13-22] 18  (10/31 0537) BP: (106-139)/(64-81) 120/64 mmHg (10/31 0537) SpO2:  [96 %-100 %] 96 % (10/31 0537) Weight:  [96.9 kg (213 lb 10 oz)-98.1 kg (216 lb 4.3 oz)] 98.1 kg (216 lb 4.3 oz) (10/31 0537) Weight change: 2.9 kg (6 lb 6.3 oz)  Intake/Output from previous day: 10/30 0701 - 10/31 0700 In: 2006.7 [P.O.:800; I.V.:1206.7] Out: 1432 [Urine:680; Stool:750] Intake/Output this shift: Total I/O In: -  Out: 50 [Urine:50]  General appearance: alert and cooperative Resp: clear to auscultation bilaterally Cardio: S1, S2 normal and systolic murmur: holosystolic 2/6, blowing at apex GI: ostomy R mid abdm, pos bs,soft, wound mid abdm Extremities: R fem cath, no edema  Lab Results:  [facility] 04/02/12 0549 04/01/12 0445  WBC 12.2* 10.1  HGB 9.2* 9.6*  HCT 27.3* 28.3*  PLT 140* 160   BMET:  Basename 04/02/12 0549 04/01/12 0445  NA 135 133*  K 3.7 3.7  CL 99 91*  CO2 27 30  GLUCOSE 97 95  BUN 44* 79*  CREATININE 6.50* 10.21*  CALCIUM 6.9* 7.1*   No results found for this basename: PTH:2 in the last 72 hours Iron Studies:  Basename 03/30/12 1611  IRON 77  TIBC 211*  TRANSFERRIN --  FERRITIN --    Studies/Results: No results found.  I have reviewed the patient's current medications.  Assessment/Plan: 1 AKI making some urine, clearance unknown as labs reflect dialysis yest.  Check in am .  Acid/base/K/vol ok 2 Anemia of acute illness 3 S/p polyp resx and dehisc high vol ostomy output 4 Fever ? F/u per primary P check am chem, ivf    LOS: 4 days   Perpetua Elling L 04/02/2012,9:13 AM

## 2012-04-02 NOTE — Progress Notes (Signed)
Internal Medicine Teaching Service Attending Note Date: 04/02/2012  Patient name: [name]  Medical record number: [id number]  Date of birth: 03-16-1951   I have seen and evaluated [name] and discussed their care with the Residency Team.      . ampicillin-sulbactam (UNASYN) IV  1.5 g Intravenous Q12H  . antiseptic oral rinse  15 mL Mouth Rinse BID  . atorvastatin  20 mg Oral QHS  . feeding supplement  1 Container Oral TID BM  . heparin  40 Units/kg Dialysis Once in dialysis  . heparin  5,000 Units Subcutaneous Q8H  . insulin aspart  0-9 Units Subcutaneous TID AC & HS  . insulin glargine  10 Units Subcutaneous QHS  . multivitamin  1 tablet Oral Daily  . sodium chloride  3 mL Intravenous Q12H  . vancomycin  2,000 mg Intravenous Once     Physical Exam: Blood pressure 116/74, pulse 87, temperature 101.7 F (38.7 C), temperature source Oral, resp. rate 18, height 6' (1.829 m), weight 98.1 kg (216 lb 4.3 oz), SpO2 99.00%. Resp: clear to auscultation bilaterally Cardio: regular rate and rhythm GI: normal findings: bowel sounds normal and soft, non-tender  Lab results: Results for orders placed during the hospital encounter of 03/29/12 (from the past 24 hour(s))  GLUCOSE, CAPILLARY     Status: Abnormal   Collection Time   04/01/12  4:57 PM      Component Value Range   Glucose-Capillary 105 (*) 70 - 99 mg/dL   Comment 1 Documented in Chart     Comment 2 Notify RN    HIV ANTIBODY (ROUTINE TESTING)     Status: Normal   Collection Time   04/01/12  5:31 PM      Component Value Range   HIV NON REACTIVE  NON REACTIVE  GLUCOSE, CAPILLARY     Status: Abnormal   Collection Time   04/01/12  9:26 PM      Component Value Range   Glucose-Capillary 117 (*) 70 - 99 mg/dL   Comment 1 Notify RN     Comment 2 Documented in Chart    CBC     Status: Abnormal   Collection Time   04/02/12  5:49 AM      Component Value Range   WBC 12.2 (*) 4.0 - 10.5 K/uL   RBC 3.73 (*) 4.22  - 5.81 MIL/uL   Hemoglobin 9.2 (*) 13.0 - 17.0 g/dL   HCT 27.3 (*) 39.0 - 52.0 %   MCV 73.2 (*) 78.0 - 100.0 fL   MCH 24.7 (*) 26.0 - 34.0 pg   MCHC 33.7  30.0 - 36.0 g/dL   RDW 18.1 (*) 11.5 - 15.5 %   Platelets 140 (*) 150 - 400 K/uL  RENAL FUNCTION PANEL     Status: Abnormal   Collection Time   04/02/12  5:49 AM      Component Value Range   Sodium 135  135 - 145 mEq/L   Potassium 3.7  3.5 - 5.1 mEq/L   Chloride 99  96 - 112 mEq/L   CO2 27  19 - 32 mEq/L   Glucose, Bld 97  70 - 99 mg/dL   BUN 44 (*) 6 - 23 mg/dL   Creatinine, Ser 6.50 (*) 0.50 - 1.35 mg/dL   Calcium 6.9 (*) 8.4 - 10.5 mg/dL   Phosphorus 3.5  2.3 - 4.6 mg/dL   Albumin 2.2 (*) 3.5 - 5.2 g/dL   GFR calc non Af Amer 8 (*) >  90 mL/min   GFR calc Af Amer 10 (*) >90 mL/min  GLUCOSE, CAPILLARY     Status: Abnormal   Collection Time   04/02/12  7:48 AM      Component Value Range   Glucose-Capillary 106 (*) 70 - 99 mg/dL  GLUCOSE, CAPILLARY     Status: Abnormal   Collection Time   04/02/12 12:00 PM      Component Value Range   Glucose-Capillary 187 (*) 70 - 99 mg/dL  URINALYSIS, ROUTINE W REFLEX MICROSCOPIC     Status: Abnormal   Collection Time   04/02/12  1:15 PM      Component Value Range   Color, Urine YELLOW  YELLOW   APPearance TURBID (*) CLEAR   Specific Gravity, Urine 1.016  1.005 - 1.030   pH 6.0  5.0 - 8.0   Glucose, UA 100 (*) NEGATIVE mg/dL   Hgb urine dipstick LARGE (*) NEGATIVE   Bilirubin Urine NEGATIVE  NEGATIVE   Ketones, ur NEGATIVE  NEGATIVE mg/dL   Protein, ur 100 (*) NEGATIVE mg/dL   Urobilinogen, UA 0.2  0.0 - 1.0 mg/dL   Nitrite NEGATIVE  NEGATIVE   Leukocytes, UA LARGE (*) NEGATIVE  URINE MICROSCOPIC-ADD ON     Status: Abnormal   Collection Time   04/02/12  1:15 PM      Component Value Range   Squamous Epithelial / LPF RARE  RARE   WBC, UA TOO NUMEROUS TO COUNT  <3 WBC/hpf   RBC / HPF 11-20  <3 RBC/hpf   Bacteria, UA MANY (*) RARE    Imaging results:  No results  found.  Assessment and Plan: I agree with the formulated Assessment and Plan with the following changes:   ARF Pyuria, suspected UTI Colon Ca Decubitus Making some urine, still needing HD Has fever, pyuria, mild leukocytosis. Will start unasyn, vanco. Await BCx, UCx. Alternatively, this could be GI source, his CT abd showed omental inflammation. If no improvement, would consider repeat CT abd.

## 2012-04-02 NOTE — Progress Notes (Signed)
ANTIBIOTIC CONSULT NOTE - INITIAL  Pharmacy Consult for vancomycin + unasyn Indication: leukocytosis with recent abdominal surgery/wound  No Known Allergies  Patient Measurements: Height: 6' (182.9 cm) Weight: 216 lb 4.3 oz (98.1 kg) IBW/kg (Calculated) : 77.6   Vital Signs: Temp: 99.9 F (37.7 C) (10/31 [id number]) Temp src: Oral (10/31 0537) BP: 120/64 mmHg (10/31 0537) Pulse Rate: 90  (10/31 0537) Intake/Output from previous day: 10/30 0701 - 10/31 0700 In: 2006.7 [P.O.:800; I.V.:1206.7] Out: 1432 [Urine:680; Stool:750] Intake/Output from this shift: Total I/O In: 240 [P.O.:240] Out: 500 [Urine:150; Stool:350]  Labs:  [facility] 04/02/12 0549 04/01/12 0445 03/31/12 1000 03/31/12 0510  WBC 12.2* 10.1 9.2 --  HGB 9.2* 9.6* 9.8* --  PLT 140* 160 195 --  LABCREA -- -- -- --  CREATININE 6.50* 10.21* -- 15.55*   Estimated Creatinine Clearance: 14.7 ml/min (by C-G formula based on Cr of 6.5). No results found for this basename: VANCOTROUGH:2,VANCOPEAK:2,VANCORANDOM:2,GENTTROUGH:2,GENTPEAK:2,GENTRANDOM:2,TOBRATROUGH:2,TOBRAPEAK:2,TOBRARND:2,AMIKACINPEAK:2,AMIKACINTROU:2,AMIKACIN:2, in the last 72 hours   Microbiology: Recent Results (from the past 720 hour(s))  MRSA PCR SCREENING     Status: Normal   Collection Time   03/30/12  3:18 AM      Component Value Range Status Comment   MRSA by PCR NEGATIVE  NEGATIVE Final   URINE CULTURE     Status: Normal   Collection Time   03/30/12  3:45 AM      Component Value Range Status Comment   Specimen Description URINE, CATHETERIZED   Final    Special Requests NONE   Final    Culture  Setup Time 03/30/2012 04:33   Final    Colony Count NO GROWTH   Final    Culture NO GROWTH   Final    Report Status 03/31/2012 FINAL   Final     Medical History: Past Medical History  Diagnosis Date  . Hyperlipidemia   . Hypertension   . Heart murmur   . Diabetes mellitus     type 2 IDDM x 15 years  . Sleep apnea     does not wear CPAP now  .  Colon polyp   . Arthritis     left knee  . 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 12/25/2011    Medications:  Prescriptions prior to admission  Medication Sig Dispense Refill  . atorvastatin (LIPITOR) 20 MG tablet Take 20 mg by mouth at bedtime.      . carvedilol (COREG) 6.25 MG tablet Take 1 tablet (6.25 mg total) by mouth 2 (two) times daily with a meal.  Rodney tablet  0  . insulin NPH-insulin regular (NOVOLIN 70/30) (70-30) 100 UNIT/ML injection Inject 10-30 Units into the skin 2 (two) times daily with a meal. Inject 30 units in the morning and 10 units at bedtime.  10 mL  1  . loperamide (IMODIUM) 2 MG capsule Take 1 capsule (2 mg total) by mouth 4 (four) times daily as needed for diarrhea or loose stools (for very loose ileostomy output; output should be thicker (pancake batter consistency) not watery).  30 capsule  3  . metFORMIN (GLUCOPHAGE) 500 MG tablet Take 1,000 mg by mouth 2 (two) times daily with a meal.       Assessment: [name] [facility] on 10/27 from SNF due to vomiting and decreased urine output. Recently s/p R hemicolectomy which was complicated by a post-op intra-abdominal abscess. He has ARF and has received HD the last 2 days but no plans for today. He was not on HD PTA. Unclear  plans at this point for further HD. He is not on a regular schedule at this point. He will be started on empiric vancomycin + unasyn for leukocytosis.   Vanc 10/31>> Unasyn 10/31>>  Goal of Therapy:  Vanc trough 10-20 or pre-HD level of 15-25  Plan:  1. Unasyn 1.5gm IV Q12H 2. Vanc 2gm IV x 1 - will f/u HD plans and renal fxn for further dosing 3. F/u HD plans, renal fxn, UOP, C&S, clinical status and vanc level when appropriate  Graceann Boileau, Rande Lawman 04/02/2012,11:38 AM

## 2012-04-02 NOTE — Progress Notes (Signed)
Clinical Social Work-CSW confirmed facility able to accept pt back to [place] when medically appropriate- Gerre Scull, [phone]

## 2012-04-02 NOTE — Care Management Note (Signed)
    Page 1 of 2   04/06/2012     4:11:37 PM   CARE MANAGEMENT NOTE 04/06/2012  Patient:  [name],[name]   Account Number:  [id number]  Date Initiated:  04/02/2012  Documentation initiated by:  Tomi Bamberger  Subjective/Objective Assessment:   dx acute renal failure, temp  admit- from [facility] snf     Action/Plan:   Anticipated DC Date:  04/06/2012   Anticipated DC Plan:  [place] referral  Clinical Social Worker      DC Forensic scientist  CM consult      [facility] Choice  HOME HEALTH   Choice offered to / List presented to:  C-1 Patient        [place] arranged  HH-1 RN  [place].   Status of service:  Completed, signed off Medicare Important Message given?   (If response is "NO", the following Medicare IM given date fields will be blank) Date Medicare IM given:   Date Additional Medicare IM given:    Discharge Disposition:  [place]  Per UR Regulation:  Reviewed for med. necessity/level of care/duration of stay  If discussed at [place] of Stay Meetings, dates discussed:    Comments:  04/06/12 16:09 Tomi Bamberger RN, BSN [phone] patient chose [facility] for [facility] and CSW,  patient states he has [name] rolling walker at home. Daughter Joseph Art will be transporting patient home.  Referral made to [facility], [place] notified.  Soc will began 24-48 hrs post discharge.  04/02/12 13:58 Tomi Bamberger RN, BSN [phone] patient is from [facility], [place] following.  Patient with temp of 101.7 today, ID following, will start unasyn and vanc, await bld cx and ua cx.

## 2012-04-02 NOTE — Progress Notes (Addendum)
Subjective: Feeling better this morning. Abdominal pain improved. HD yesterday, not further back pain. Foley removed and pt able to void. Objective: Vital signs in last 24 hours: Filed Vitals:   04/01/12 1524 04/01/12 2045 04/02/12 0537 04/02/12 0608  BP: 126/66 128/75 120/64   Pulse: 93 89 90   Temp: 98.2 F (36.8 C) 99.7 F (37.6 C) 101.2 F (38.4 C) 99.9 F (37.7 C)  TempSrc: Oral Oral Oral   Resp: 20 20 18    Height:      Weight:   216 lb 4.3 oz (98.1 kg)   SpO2: 98% 99% 96%    Physical Exam: Vitals reviewed. General: Resting in bed, NAD HEENT: PERRL, EOMI, no scleral icterus Cardiac: RRR, no rubs, murmurs or gallops Pulm: Clear to auscultation bilaterally, no wheezes, rales, or rhonchi Abd: Soft, nontender, wound inact with good granulation tissue, increased purulent drainage this morning, nondistended, BS present, ostomy with liquid output Ext: 2+ pulses. Warm and well perfused, no pedal edema Skin: Stage 2, small decubitus ulcer, unchanged since admission. Neuro: Alert and oriented X3, cranial nerves II-XII grossly intact, strength and sensation to light touch equal in bilateral upper and lower extremities  Labs: Basic Metabolic Panel:  Lab  [id number]  03/30/12 1111  03/30/12 0450  03/29/12 2143   NA  131*  129*  127*  120*   K  3.4*  4.7  4.5  6.5*   CL  79*  83*  82*  74*   CO2  32  23  18*  16*   GLUCOSE  131*  110*  86  158*   BUN  147*  151*  158*  156*   CREATININE  15.55*  17.02*  16.82*  17.38*   CALCIUM  7.3*  8.3*  8.5  --   MG  --  --  --  --   PHOS  11.4*  --  --  --    Liver Function Tests: Lab  03/31/12 0510  03/30/12 0450  03/29/12 2143   AST  24  26  33   ALT  13  18  23    ALKPHOS  77  104  130*   BILITOT  0.8  0.8  1.0   PROT  6.5  8.1  9.7*   ALBUMIN  2.3*  2.8*  3.4*    Lab  03/29/12 2143   LIPASE  143*   AMYLASE  --    CBC: Lab  03/31/12 0510  03/29/12 2143   WBC  --  14.7*   NEUTROABS  PENDING  10.1*   HGB  --  13.5   HCT   --  36.7*   MCV  --  68.3*   PLT  --  358    CBG: Lab  03/30/12 2134  03/30/12 1508  03/30/12 1154  03/30/12 0734  03/30/12 0202   GLUCAP  123*  130*  114*  94  42    Microbiology: Results for orders placed during the hospital encounter of 03/29/12   MRSA PCR SCREENING     Status: Normal     Collection Time     03/30/12  3:18 AM       Component  Value  Range  Status  Comment     MRSA by PCR  NEGATIVE   NEGATIVE  Final     URINE CULTURE     Status: Normal     Collection Time     03/30/12  3:45 AM  Component  Value  Range  Status  Comment     Specimen Description  URINE, CATHETERIZED     Final       Special Requests  NONE     Final       Culture  Setup Time  03/30/2012 04:33     Final       Colony Count  NO GROWTH     Final       Culture  NO GROWTH     Final       Report Status  03/31/2012 FINAL     Final      Imaging: Ct Abdomen Pelvis W Contrast  03/29/2012  *RADIOLOGY REPORT*  Clinical Data: Status post fall.  History of intra-abdominal abscess secondary to the anastomotic leak.  History of colon cancer.  CT ABDOMEN AND PELVIS WITH CONTRAST  Technique:  Multidetector CT imaging of the abdomen and pelvis was performed following the standard protocol during bolus administration of intravenous contrast.  Contrast: 138mL OMNIPAQUE IOHEXOL 300 MG/ML  SOLN  Comparison: Chest and two views abdomen 02/16/2012.  Findings: The lung bases are clear.  There is no pleural or pericardial effusion.  The patient has a midline surgical wound.  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  The patient is status post right hemicolectomy.  There is no abscess.  There is extensive infiltration of omental fat, particularly on the left.  There is no rim enhancing fluid collection although there are some small foci of fluid density material present.  No evidence of small bowel obstruction is present.  The stomach appears normal.  A few small stones are seen layering dependently in the gallbladder but  there is no evidence of cholecystitis.  The liver, adrenal glands, spleen and pancreas appear normal.  Low attenuating lesions in both kidneys are most consistent with cysts. There are lymph nodes in the porta hepatis measuring up to 1.8 cm on image 21. Foley catheter is in place urinary bladder with an air-fluid level identified.  No focal bony abnormality is seen with extensive degenerative disease present about the hips.  IMPRESSION:  1.  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 2.  Extensive infiltration of omental fat, worse on the left, could be due to infectious or inflammatory process or postoperative change.  Omental tumor implants are possible but felt somewhat less likely.  No abscess is identified. 3.  Fatty infiltration of the liver. 4.  Small lymph node in the porta hepatis is nonspecific and likely reactive.  Recommend attention on follow-up exams. 5.  Small gallstones without evidence of cholecystitis.   Original Report Authenticated By: Arvid Right. Luther Parody, M.D.     US Renal 03/30/2012  *RADIOLOGY REPORT*  Clinical Data: Acute renal failure.  RENAL/URINARY TRACT ULTRASOUND COMPLETE  Comparison:  CT scan dated 05/29/2012  Findings:  Right Kidney:  13.4 cm in length.  Several small cysts in the right kidney.  No hydronephrosis. Slightly echogenic renal parenchyma, equal to the liver.  Left Kidney:  12.6 cm in length.  3.3 cm oblong cyst in the upper pole.  No hydronephrosis.  Bladder: Almost empty.  IMPRESSION:  1.  No hydronephrosis.  Slight increased echogenicity of the renal parenchyma consistent with renal medical disease. 2.  Small benign-appearing cysts on both kidneys.   Original Report Authenticated By: Larey Seat, M.D.        Medications: I have reviewed the patient's current medications. Scheduled Meds:  .  antiseptic oral rinse  15 mL Mouth Rinse BID  . atorvastatin  20 mg Oral QHS  . feeding supplement  1 Container  Oral TID BM  . heparin  5,000 Units Subcutaneous Q8H  . insulin aspart      . insulin aspart  0-9 Units Subcutaneous TID AC & HS  . insulin glargine  10 Units Subcutaneous QHS  . sodium chloride  3 mL Intravenous Q12H  . DISCONTD: carvedilol  6.25 mg Oral BID WC  . DISCONTD: insulin aspart  0-9 Units Subcutaneous Q4H  . DISCONTD: sodium chloride  1,000 mL Intravenous Once   Continuous Infusions: .  sodium bicarbonate infusion sterile water 1000 mL 250 mL/hr at 03/30/12 0416   Followed by  .  sodium bicarbonate infusion sterile water 1000 mL 150 mL/hr at 03/31/12 0248   PRN Meds:.sodium chloride, sodium chloride, sodium chloride, sodium chloride, acetaminophen, alteplase, feeding supplement (NEPRO CARB STEADY), feeding supplement (NEPRO CARB STEADY), heparin, heparin, lidocaine, lidocaine, lidocaine-prilocaine, lidocaine-prilocaine, ondansetron (ZOFRAN) IV, ondansetron, pentafluoroprop-tetrafluoroeth, pentafluoroprop-tetrafluoroeth, traMADol  Assessment/Plan:  1. ARF: Creatinine 18 on admission, and pt reported no uop in at least 4 days. He also had increased ostomy output and emesis over the week prior to admission. Unfortunately a CT abd/pelvis with contrast was obtained in the ED, which likely worsened his renal failure. Renal was consulted and pt aggressively hydrated with bicarb in sterile water per Nephrology, which was then changed to normal saline. HD x2 since 10/29- No HD today. Cr 6.5 w/ GFR 10 this morning. - F/u w/ Neph - am labs per Nephrology - Monitor uop - NS@50cc /hr  2. Colon adenocarcinoma s/p right hemicolectomy: Pt with unfortunate anastomotic leak with intraabdominal abscess formation s/p right hemicolectomy in September. He was taken back to the OR for multiple washouts, and his abdomen was closed on 02/26/12. He had a wound vac in place, which was removed at d/c on 03/10/12. The lower half of the wound healed, but the upper half is still healing and is open superficially  with visible fascial sutures. Fibrinous material is present in the wound on admission to the hospital for ARF. Possible some mild purulence in the inferior portion of the wound. General Surgery does not feel that the pt requires abx. Wound Care is also on board and is following. Wound looks appears to continue healing well with good granulation tissue; however, he does have increase draiange this morning. Given his increasing leukocytosis to 12.2 today, will check blood cultures, urine cx, and start abx. - Bcx x2 - UA w/ Ucx - Unasyn and Vancomycin per Pharmacy - am CBC - Continue wound care per General Surgery and Wound Care  3. Protein Gap:  Given the patient's age, gender, race, protein gap >4, weight loss, renal failure, and recent anemia with rouleaux on diff, multiple myeloma needed to be ruled out. Free Kappa elevated to 72.6, Free Lambda elevated to 20.6, ratio normal at 3.52, no evidence of a monoclonal spike, unlikely multiple myeloma.  4 Hyperkalemia: Potassium 6.5 on admission, but pt very dehydrated. After IVF started, am BMP with K+ 4.5 --> 4.7 --> 3.4 -->3.7. Will continue to hydrate and monitor potassium levels.  - Labs per Nephrology - Renal panel in the am  5. Stage 2 decubitus ulcer: Present on admission. Wound Care consulted, and they recommended silicone foam dressings. No further intervention needed per WC.  6. DVT PPx: [place] Heparin    LOS: 4 days   Otho Bellows 04/02/2012, 7:17 AM

## 2012-04-03 DIAGNOSIS — L8990 Pressure ulcer of unspecified site, unspecified stage: Secondary | ICD-10-CM

## 2012-04-03 LAB — GLUCOSE, CAPILLARY
Glucose-Capillary: 137 mg/dL — ABNORMAL HIGH (ref 70–99)
Glucose-Capillary: 170 mg/dL — ABNORMAL HIGH (ref 70–99)
Glucose-Capillary: 182 mg/dL — ABNORMAL HIGH (ref 70–99)
Glucose-Capillary: 96 mg/dL (ref 70–99)

## 2012-04-03 LAB — RENAL FUNCTION PANEL
Albumin: 2.1 g/dL — ABNORMAL LOW (ref 3.5–5.2)
BUN: 39 mg/dL — ABNORMAL HIGH (ref 6–23)
CO2: 27 meq/L (ref 19–32)
Calcium: 7.3 mg/dL — ABNORMAL LOW (ref 8.4–10.5)
Chloride: 99 meq/L (ref 96–112)
Creatinine, Ser: 5.68 mg/dL — ABNORMAL HIGH (ref 0.50–1.35)
GFR calc Af Amer: 11 mL/min — ABNORMAL LOW
GFR calc non Af Amer: 10 mL/min — ABNORMAL LOW
Glucose, Bld: 106 mg/dL — ABNORMAL HIGH (ref 70–99)
Phosphorus: 2.9 mg/dL (ref 2.3–4.6)
Potassium: 4.1 meq/L (ref 3.5–5.1)
Sodium: 135 meq/L (ref 135–145)

## 2012-04-03 LAB — CBC
HCT: 27.5 % — ABNORMAL LOW (ref 39.0–52.0)
Hemoglobin: 9.2 g/dL — ABNORMAL LOW (ref 13.0–17.0)
MCH: 24.7 pg — ABNORMAL LOW (ref 26.0–34.0)
MCHC: 33.5 g/dL (ref 30.0–36.0)
MCV: 73.9 fL — ABNORMAL LOW (ref 78.0–100.0)
Platelets: 123 10*3/uL — ABNORMAL LOW (ref 150–400)
RBC: 3.72 MIL/uL — ABNORMAL LOW (ref 4.22–5.81)
RDW: 18.5 % — ABNORMAL HIGH (ref 11.5–15.5)
WBC: 12.2 10*3/uL — ABNORMAL HIGH (ref 4.0–10.5)

## 2012-04-03 NOTE — Progress Notes (Signed)
Seen and agree with SPT note Carrington Olazabal Tabor Jahkai Yandell, PT [phone]  

## 2012-04-03 NOTE — Progress Notes (Signed)
Clinical Social Work-CSW spoke briefly with pt at bedside-pt relays that he will not return to [place] will return home with the help of his 3 daughters-CSW encouraged pt to get all family members on the same plan in order to create a safe d/c plan IF  home is an option-pt adamant he will return home and that he will arrange 24 hour care- CSW will follow up on Monday-If weekend needs arise please contact coverage- Gerre Scull, [phone]

## 2012-04-03 NOTE — Progress Notes (Signed)
Notified [name], NP that patient's temp is 101.2. Patient is hurting giving tynenol for pain now. Patient's CBG is 96. [name], NP stated to give Lantus 10units SQ as ordered tonight. No other orders given. Will continue to monitor patient. [name]

## 2012-04-03 NOTE — Progress Notes (Signed)
OT Cancellation Note  Patient Details Name: [name] MRN: [id number] DOB: [birth year]   Cancelled Treatment:    Reason Eval/Treat Not Completed: Other (comment) (Pt refusal to participate)  04/03/2012 Darrol Jump OTR/L Pager [phone] Office [phone]

## 2012-04-03 NOTE — Progress Notes (Signed)
Nutrition Follow-up  Intervention:   Continue current interventions RD will continue to follow  Assessment:   Pt with improvement in abdominal wound per notes.  Was started on HD, but labs now improving has not had HD in 2 days.  States he is eating well, drinking Facilities manager.   Diet Order:  Renal 60/70 PO intake: 0-25% Supplements: Nepro PRN, Resource Breeze po TID, each supplement provides 250 kcal and 9 grams of protein.   Meds: Scheduled Meds:   . ampicillin-sulbactam (UNASYN) IV  1.5 g Intravenous Q12H  . antiseptic oral rinse  15 mL Mouth Rinse BID  . atorvastatin  20 mg Oral QHS  . feeding supplement  1 Container Oral TID BM  . heparin  40 Units/kg Dialysis Once in dialysis  . heparin  5,000 Units Subcutaneous Q8H  . insulin aspart  0-9 Units Subcutaneous TID AC & HS  . insulin glargine  10 Units Subcutaneous QHS  . multivitamin  1 tablet Oral Daily  . sodium chloride  3 mL Intravenous Q12H  . vancomycin  2,000 mg Intravenous Once   Continuous Infusions:   . sodium chloride 50 mL/hr at 04/02/12 2218   PRN Meds:.sodium chloride, sodium chloride, sodium chloride, sodium chloride, acetaminophen, feeding supplement (NEPRO CARB STEADY), feeding supplement (NEPRO CARB STEADY), heparin, heparin, lidocaine, lidocaine, lidocaine-prilocaine, lidocaine-prilocaine, loperamide, ondansetron (ZOFRAN) IV, ondansetron, pentafluoroprop-tetrafluoroeth, pentafluoroprop-tetrafluoroeth, traMADol   CMP     Component Value Date/Time   NA 135 04/03/2012 0656   K 4.1 04/03/2012 0656   CL 99 04/03/2012 0656   CO2 27 04/03/2012 0656   GLUCOSE 106* 04/03/2012 0656   BUN 39* 04/03/2012 0656   CREATININE 5.68* 04/03/2012 0656   CREATININE 1.12 07/04/2011 1516   CALCIUM 7.3* 04/03/2012 0656   PROT 6.5 03/31/2012 0510   ALBUMIN 2.1* 04/03/2012 0656   AST 24 03/31/2012 0510   ALT 13 03/31/2012 0510   ALKPHOS 77 03/31/2012 0510   BILITOT 0.8 03/31/2012 0510   GFRNONAA 10* 04/03/2012 0656   GFRAA  11* 04/03/2012 0656   Sodium  Date/Time Value Range Status  04/03/2012  6:56 AM 135  135 - 145 mEq/L Final  04/02/2012  5:49 AM 135  135 - 145 mEq/L Final  04/01/2012  4:45 AM 133* 135 - 145 mEq/L Final    Potassium  Date/Time Value Range Status  04/03/2012  6:56 AM 4.1  3.5 - 5.1 mEq/L Final  04/02/2012  5:49 AM 3.7  3.5 - 5.1 mEq/L Final  04/01/2012  4:45 AM 3.7  3.5 - 5.1 mEq/L Final    Phosphorus  Date/Time Value Range Status  04/03/2012  6:56 AM 2.9  2.3 - 4.6 mg/dL Final  04/02/2012  5:49 AM 3.5  2.3 - 4.6 mg/dL Final  04/01/2012  4:45 AM 6.2* 2.3 - 4.6 mg/dL Final    Magnesium  Date/Time Value Range Status  03/03/2012  6:10 AM 1.8  1.5 - 2.5 mg/dL Final  03/02/2012  3:59 AM 1.7  1.5 - 2.5 mg/dL Final  03/01/2012  4:50 AM 1.8  1.5 - 2.5 mg/dL Final      CBG (last 3)   Basename 04/03/12 1221 04/03/12 0755 04/02/12 2151  GLUCAP 170* 137* 116*     Intake/Output Summary (Last 24 hours) at 04/03/12 1424 Last data filed at 04/03/12 0556  Gross per 24 hour  Intake 1401.67 ml  Output    845 ml  Net 556.67 ml    Weight Status:  214 lbs, still up from admission weight  Re-estimated needs:  2150-2350 kcal, 105-120 gm   Nutrition Dx:  Malnutrition related to suboptimal oral intake as evidenced by 26% weight loss in 1.5 months and severe loss of muscle mass and subcutaneous fat  Goal:  Intake to meet >90% of estimated nutrition needs  Monitor:  PO intake, weight, labs, I/O's   Orson Slick RD, LDN Pager [phone] After Hours pager [phone]

## 2012-04-03 NOTE — Progress Notes (Signed)
Internal Medicine Attending  Date: 04/03/2012  Patient name: [name] Medical record number: [id number] Date of birth: 24-Mar-1951 Age: 61 y.o. Gender: male  I saw and evaluated the patient. I reviewed the resident's note by Dr. Eulas Post and I agree with the resident's findings and plans as documented in her note.

## 2012-04-03 NOTE — Progress Notes (Signed)
Patient ID: [name], male   DOB: 1951-03-08, 61 y.o.   MRN: [id number] Medical Student Daily Progress Note   Subjective:    Interval Events:   Pt had two increased temperatures yesterday (101.7 @ 1323 and 100.6 @ 2033 both on 10/31) both of which resolved with no medical intervention.  No acute events overnight.  Today, the pt is complaining of intermittent abdominal pain at his incision that has been controlled with acetaminophen and tramadol.    Today Pt is still complaining of hiccups that are fairly constant and get worse when lying flat he is unsure if the hiccups have ever completely stopped.  Pt also complains of urinary urgency and frequency (urinating every 2 hours and needing the urinal right away when the sensation hits).    Objective:    Vital Signs:   Temp:  [98.1 F (36.7 C)-101.7 F (38.7 C)] 98.3 F (36.8 C) (11/01 0432) Pulse Rate:  [81-87] 87  (11/01 0432) Resp:  [18-20] 20  (11/01 0432) BP: (110-126)/(64-77) 126/77 mmHg (11/01 0432) SpO2:  [98 %-100 %] 100 % (11/01 0432) Weight:  [97.1 kg (214 lb 1.1 oz)] 97.1 kg (214 lb 1.1 oz) (11/01 0432) Last BM Date: 04/02/12   Weights: Weight at admission - 89.4 24-hour Weight change: 0.2 kg (7.1 oz)   Filed Weights   04/01/12 1435 04/02/12 0537 04/03/12 0432  Weight: 97.1 kg (214 lb 1.1 oz) 98.1 kg (216 lb 4.3 oz) 97.1 kg (214 lb 1.1 oz)   Net since admission: 7.7  Intake/Output:   Intake/Output Summary (Last 24 hours) at 04/03/12 0959 Last data filed at 04/03/12 0556  Gross per 24 hour  Intake 1521.67 ml  Output   1095 ml  Net 426.67 ml     Net since admission:  2933.67  Physical Exam: GENERAL:  alert and oriented; resting comfortably in bed and in no distress; hiccupping constantly through exam EYES:  Pupils small but equal, round, and reactive to light; sclera anicteric slightly injected, conjunctiva appear pale ENT:  moist mucous membranes, throat is clean and non-erythematous NECK:  no JVD,  mobile non tender anterior cervical lymph nodes palpated LUNGS:  clear to auscultation bilaterally, normal work of breathing HEART:  normal rate; regular rhythm; normal S1 and S2, no S3 or S4 appreciated; no murmurs, rubs or clicks ABDOMEN:  soft, non-tender, normal bowel sounds, no masses palpated, 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 appears unchanged no stool was in the bag because it had opened and spilled its contents onto the floor, superficially open vertical wound approximately 5 centimeters in length with good granulation tissue intact fascial sutures seen, pooling of yellow green liquid at inferior aspect of wound and in pt's umbilicus, able to express more of the same fluid from the inferior aspect of the wound with palpation BACK: Ulcer not assessed due to pt sitting up in chair EXTREMITIES:  No peripheral edema, dry skin noted on shins and arms bilaterally, 2+ pulses dorsalis pedis and radial NEURO: EOMI,  palate rises symmetrically, sensation to light touch intact throughout, tongue protrudes to midline, 5/5 strength biceps and triceps, 4/5 deltoid strength, 3+/5 hip flexion GENITALS: No discharge at meatus SKIN:  normal turgor, dry skin on shins and arms   Labs: Basic Metabolic Panel:  Lab [id number] 04/02/12 0549 04/01/12 0445 03/31/12 0510 03/30/12 1111  NA 135 135 133* 131* 129*  K 4.1 3.7 3.7 3.4* 4.7  CL 99 99 91* 79* 83*  CO2 27 27 30  32 23  GLUCOSE 106* 97 95 131* 110*  BUN 39* 44* 79* 147* 151*  CREATININE 5.68* 6.50* 10.21* 15.55* 17.02*  CALCIUM 7.3* 6.9* 7.1* -- --  MG -- -- -- -- --  PHOS 2.9 3.5 6.2* 11.4* --    Liver Function Tests:  Lab 04/03/12 0656 04/02/12 0549 04/01/12 0445 03/31/12 0510 03/30/12 0450 03/29/12 2143  AST -- -- -- 24 26 33  ALT -- -- -- 13 18 23   ALKPHOS -- -- -- 77 104 130*  BILITOT -- -- -- 0.8 0.8 1.0  PROT -- -- -- 6.5 8.1 9.7*  ALBUMIN 2.1* 2.2* 2.3* 2.3* 2.8* --    Lab 03/29/12 2143  LIPASE 143*  AMYLASE --   CBC:  Lab 04/03/12  0656 04/02/12 0549 04/01/12 0445 03/31/12 1000 03/31/12 0510 03/29/12 2143  WBC 12.2* 12.2* 10.1 9.2 -- 14.7*  NEUTROABS -- -- -- -- 5.2 10.1*  HGB 9.2* 9.2* 9.6* 9.8* -- 13.5  HCT 27.5* 27.3* 28.3* 27.7* -- 36.7*  MCV 73.9* 73.2* 72.4* 69.6* -- 68.3*  PLT 123* 140* 160 195 -- 358    CBG:  Lab 04/03/12 0755 04/02/12 2151 04/02/12 1719 04/02/12 1200 04/02/12 0748  GLUCAP 137* 116* 142* 187* 106*    Microbiology: Results for orders placed during the hospital encounter of 03/29/12  MRSA PCR SCREENING     Status: Normal   Collection Time   03/30/12  3:18 AM      Component Value Range Status Comment   MRSA by PCR NEGATIVE  NEGATIVE Final   URINE CULTURE     Status: Normal   Collection Time   03/30/12  3:45 AM      Component Value Range Status Comment   Specimen Description URINE, CATHETERIZED   Final    Special Requests NONE   Final    Culture  Setup Time 03/30/2012 04:33   Final    Colony Count NO GROWTH   Final    Culture NO GROWTH   Final    Report Status 03/31/2012 FINAL   Final   CULTURE, BLOOD (ROUTINE X 2)     Status: Normal (Preliminary result)   Collection Time   04/02/12 12:34 PM      Component Value Range Status Comment   Specimen Description BLOOD LEFT ARM   Final    Special Requests BOTTLES DRAWN AEROBIC AND ANAEROBIC 10CC   Final    Culture  Setup Time 04/02/2012 16:59   Final    Culture     Final    Value:        BLOOD CULTURE RECEIVED NO GROWTH TO DATE CULTURE WILL BE HELD FOR 5 DAYS BEFORE ISSUING A FINAL NEGATIVE REPORT   Report Status PENDING   Incomplete   CULTURE, BLOOD (ROUTINE X 2)     Status: Normal (Preliminary result)   Collection Time   04/02/12 12:36 PM      Component Value Range Status Comment   Specimen Description BLOOD LEFT HAND   Final    Special Requests BOTTLES DRAWN AEROBIC AND ANAEROBIC 10CC   Final    Culture  Setup Time 04/02/2012 16:59   Final    Culture     Final    Value:        BLOOD CULTURE RECEIVED NO GROWTH TO DATE CULTURE  WILL BE HELD FOR 5 DAYS BEFORE ISSUING A FINAL NEGATIVE REPORT   Report Status PENDING   Incomplete      Medications:    Infusions:    .  sodium chloride 50 mL/hr at 04/02/12 2218     Scheduled Medications:    . ampicillin-sulbactam (UNASYN) IV  1.5 g Intravenous Q12H  . antiseptic oral rinse  15 mL Mouth Rinse BID  . atorvastatin  20 mg Oral QHS  . feeding supplement  1 Container Oral TID BM  . heparin  40 Units/kg Dialysis Once in dialysis  . heparin  5,000 Units Subcutaneous Q8H  . insulin aspart  0-9 Units Subcutaneous TID AC & HS  . insulin glargine  10 Units Subcutaneous QHS  . multivitamin  1 tablet Oral Daily  . sodium chloride  3 mL Intravenous Q12H  . vancomycin  2,000 mg Intravenous Once     PRN Medications: sodium chloride, sodium chloride, sodium chloride, sodium chloride, acetaminophen, feeding supplement (NEPRO CARB STEADY), feeding supplement (NEPRO CARB STEADY), heparin, heparin, lidocaine, lidocaine, lidocaine-prilocaine, lidocaine-prilocaine, loperamide, ondansetron (ZOFRAN) IV, ondansetron, pentafluoroprop-tetrafluoroeth, pentafluoroprop-tetrafluoroeth, traMADol    Assessment/ Plan:    Mr. Postiglione is a 61 yo gentleman with a history of type 2 diabetes on insulin, invasive adenocarcinoma of colon s/p right hemicolectomy complicated by postop intra-abdominal abscess with a leaking anastamosis, OSA, hypertension HD #5 being treated for acute renal failure.   Acute renal failure: Creatinine 5.68, BUN 39 (baseline Cr 0.9-1)  Likely secondary to extreme volume depletion in the setting of nausea, vomiting, increased ileostomy output. On 02/05/12 he weighed 256 lbs today he weighs 214. Appears less volume depleted on exam. BMP values improving. Anion gap is 14. Metabolic acidosis likely due to renal failure. Renal US showed no hydronephrosis, only bilateral cysts.  -Labs continue to improve after holding dialysis 10/31, will follow Renal's recommendations pertaining to  further need for dialysis -Pt continues to make urine -Continue fluids NS at 50cc/hr  -Strict I and O's, daily weights  -Zofran PRN  -Follow BMP   Leukocytosis:  Pt's WBC count has increased to 12.2 and spiked a temp to 101.2.  Potential sources of infection include indwelling foley catheter (dc'd 10/31), and an abdominal source given his ileostomy and multiple bowel surgeries. -UA showed turbid appearance, leukocytes, and many bacteria -Urine culture pending -Blood cultures have shown no growth to date -Continue therapy with ampicillin/sulbactam and vancomycin  Colon adenocarcinoma: S/p right hemicolectomy and ileostomy creation  WBC 12.2 as of 11/1, temperature spikes to 101.7 and 100.6 10/31, hx of abdominal abscess 2/2 leaking anastamosis. CT scan abd/pelvis with inflammation of omental fat, no focal abscess noted, no obstruction. Pt with abdominal pain around incision, no rebound or guarding. Ileostomy site appears clear, +output. Does not appear septic.  Physical exam showed increased purulence from abdominal incision this plus increased WBC suggests potential infection. -Consult Surgery given increased purulence at incision -Consider repeat abdominal CT but will wait for Surgery's recommendations -Continue wound care with daily wet to dry dressings -Imodium to correct loose stools -follow CBC, vitals  -Acetaminophen and tramadol prn for analgesia  Anemia: Hgb - 9.2, Hct - 27.5, MCV 73.2, platelets 123.  Potentially a result of HD catheter placement, HD, and aggressive fluid rehydration.  Also could be a combination of chronic inflammation (he has had cancer and multiple surgeries in his recent past) and decreased iron intake (unclear on what his diet was like outside of the hospital but intake has been poor during this admission) -Stable from yesterday -No intervention at this time  Proximal Muscle Weakness: Noted during physical exam (3+/5 hip flexion, 4/5 deltoid strength) likely  due to deconditioning pt has largely been immobile for  an extended period of time. -PT is working with the patient  Increased Protein Gap:  Given the patient's age, gender, race, protein gap >4, weight loss, renal failure, and recent anemia, along with his initial finding of rouleaux formation multiple myeloma should be considered.  He does have explanations for these symptoms (severe dehydration can cause rouleaux, anemia could be due to recent start of hemodialysis and has been receiving aggressive rehydration, he is not hypercalcemic, he has had multiple surgeries and decreased activity level which could contribute to weight loss) -Free Kappa increased at 72.6, Free Lambda increased to 20.6, ratio normal at 3.52, no evidence of a monoclonal spike -Unlikely to be multiple myeloma given these results  Hypokalemia: K 4.1 as of 11/1 Pt was hyperkalemic at admission with ECG changes that resolved with sodium polystyrene sulfonate. Since admission his K has decreased to wnl and today is just below reference range. Likely due to GI losses and NPO status iv fluids have not had K in them.  -Has since resolved  Decubitus Ulcer: Grade 2 located midline of superior aspect of gluteal cleft -continue routine wound care -encouraged regular turns in bed to offload -Maintain nutrition with regular meals and supplementation  Diabetes:  -sugars have run slightly high during this stay  -hold metformin as pt with metabolic acidosis and renal failure  -continue lantus 10 units daily with correction scale  -Check blood sugar every 4 hours  -Continue renal diet but suggest low sugar  Hypotension:  Last episode on 10/31 (101/53).  Pt's BP's have been normal since.  -NS running at 50 cc/hr -continue PO intake  Hyperlipidemia:  -Continue home atorvastatin  -Goal of LDL <70   VTE Prophylaxis:  -heparin [place]   Diet:  -Continue renal diet but try to decrease amount of carbohydrates with meals -Encourage pt  to eat (the percent of meal eaten has been low) -Pt fluid restricted to 1200 mL per day  Dispo: -Plan to dc back to [facility] when pt is medically stable (return of renal function, electrolytes have normalized, leukocytosis has resolved)     Length of Stay: 5 days   This is a Careers information officer Note.  The care of the patient was discussed with Dr. Eulas Post and the assessment and plan formulated with their assistance.  Please see their attached note or addendum for official documentation of the daily encounter.

## 2012-04-03 NOTE — Progress Notes (Signed)
Subjective: Hiccups again this morning. Abdominal pain improving.   Objective: Vital signs in last 24 hours: Filed Vitals:   04/02/12 1429 04/02/12 2033 04/02/12 2118 04/03/12 0432  BP:  110/64  126/77  Pulse:  81  87  Temp: 100.3 F (37.9 C) 100.6 F (38.1 C) 98.1 F (36.7 C) 98.3 F (36.8 C)  TempSrc: Oral Oral  Oral  Resp:  18  20  Height:      Weight:    214 lb 1.1 oz (97.1 kg)  SpO2:  98%  100%   Physical Exam: Vitals reviewed. General: Sitting in a chair, NAD HEENT: PERRL, EOMI, no scleral icterus Cardiac: RRR, no rubs, murmurs or gallops Pulm: Clear to auscultation bilaterally, no wheezes, rales, or rhonchi Abd: Soft, mild TTP, wound inact with good granulation tissue, purulent drainage expressed from the inferior portion of the wound, nondistended, BS present, ostomy with brownish liquid output Ext: 2+ pulses. Warm and well perfused, no pedal edema Skin: Stage 2, small decubitus ulcer, unchanged since admission. Neuro: Alert and oriented X3, cranial nerves II-XII grossly intact, strength and sensation to light touch equal in bilateral upper and lower extremities  Labs: Basic Metabolic Panel:  Lab  [id number]  03/30/12 1111  03/30/12 0450  03/29/12 2143   NA  131*  129*  127*  120*   K  3.4*  4.7  4.5  6.5*   CL  79*  83*  82*  74*   CO2  32  23  18*  16*   GLUCOSE  131*  110*  86  158*   BUN  147*  151*  158*  156*   CREATININE  15.55*  17.02*  16.82*  17.38*   CALCIUM  7.3*  8.3*  8.5  --   MG  --  --  --  --   PHOS  11.4*  --  --  --    Liver Function Tests: Lab  03/31/12 0510  03/30/12 0450  03/29/12 2143   AST  24  26  33   ALT  13  18  23    ALKPHOS  77  104  130*   BILITOT  0.8  0.8  1.0   PROT  6.5  8.1  9.7*   ALBUMIN  2.3*  2.8*  3.4*    Lab  03/29/12 2143   LIPASE  143*   AMYLASE  --    CBC: Lab  03/31/12 0510  03/29/12 2143   WBC  --  14.7*   NEUTROABS  PENDING  10.1*   HGB  --  13.5   HCT  --  36.7*   MCV  --  68.3*   PLT  --  358     CBG: Lab  03/30/12 2134  03/30/12 1508  03/30/12 1154  03/30/12 0734  03/30/12 0202   GLUCAP  123*  130*  114*  94  3    Microbiology: Results for orders placed during the hospital encounter of 03/29/12   MRSA PCR SCREENING     Status: Normal     Collection Time     03/30/12  3:18 AM       Component  Value  Range  Status  Comment     MRSA by PCR  NEGATIVE   NEGATIVE  Final     URINE CULTURE     Status: Normal     Collection Time     03/30/12  3:45 AM  Component  Value  Range  Status  Comment     Specimen Description  URINE, CATHETERIZED     Final       Special Requests  NONE     Final       Culture  Setup Time  03/30/2012 04:33     Final       Colony Count  NO GROWTH     Final       Culture  NO GROWTH     Final       Report Status  03/31/2012 FINAL     Final      Imaging: Ct Abdomen Pelvis W Contrast  03/29/2012  *RADIOLOGY REPORT*  Clinical Data: Status post fall.  History of intra-abdominal abscess secondary to the anastomotic leak.  History of colon cancer.  CT ABDOMEN AND PELVIS WITH CONTRAST  Technique:  Multidetector CT imaging of the abdomen and pelvis was performed following the standard protocol during bolus administration of intravenous contrast.  Contrast: 172mL OMNIPAQUE IOHEXOL 300 MG/ML  SOLN  Comparison: Chest and two views abdomen 02/16/2012.  Findings: The lung bases are clear.  There is no pleural or pericardial effusion.  The patient has a midline surgical wound.  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  The patient is status post right hemicolectomy.  There is no abscess.  There is extensive infiltration of omental fat, particularly on the left.  There is no rim enhancing fluid collection although there are some small foci of fluid density material present.  No evidence of small bowel obstruction is present.  The stomach appears normal.  A few small stones are seen layering dependently in the gallbladder but there is no evidence of cholecystitis.  The  liver, adrenal glands, spleen and pancreas appear normal.  Low attenuating lesions in both kidneys are most consistent with cysts. There are lymph nodes in the porta hepatis measuring up to 1.8 cm on image 21. Foley catheter is in place urinary bladder with an air-fluid level identified.  No focal bony abnormality is seen with extensive degenerative disease present about the hips.  IMPRESSION:  1.  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 2.  Extensive infiltration of omental fat, worse on the left, could be due to infectious or inflammatory process or postoperative change.  Omental tumor implants are possible but felt somewhat less likely.  No abscess is identified. 3.  Fatty infiltration of the liver. 4.  Small lymph node in the porta hepatis is nonspecific and likely reactive.  Recommend attention on follow-up exams. 5.  Small gallstones without evidence of cholecystitis.   Original Report Authenticated By: Arvid Right. Luther Parody, M.D.     US Renal 03/30/2012  *RADIOLOGY REPORT*  Clinical Data: Acute renal failure.  RENAL/URINARY TRACT ULTRASOUND COMPLETE  Comparison:  CT scan dated 05/29/2012  Findings:  Right Kidney:  13.4 cm in length.  Several small cysts in the right kidney.  No hydronephrosis. Slightly echogenic renal parenchyma, equal to the liver.  Left Kidney:  12.6 cm in length.  3.3 cm oblong cyst in the upper pole.  No hydronephrosis.  Bladder: Almost empty.  IMPRESSION:  1.  No hydronephrosis.  Slight increased echogenicity of the renal parenchyma consistent with renal medical disease. 2.  Small benign-appearing cysts on both kidneys.   Original Report Authenticated By: Larey Seat, M.D.        Medications: I have reviewed the patient's current medications. Scheduled Meds:  .  antiseptic oral rinse  15 mL Mouth Rinse BID  . atorvastatin  20 mg Oral QHS  . feeding supplement  1 Container Oral TID BM  . heparin  5,000 Units  Subcutaneous Q8H  . insulin aspart      . insulin aspart  0-9 Units Subcutaneous TID AC & HS  . insulin glargine  10 Units Subcutaneous QHS  . sodium chloride  3 mL Intravenous Q12H  . DISCONTD: carvedilol  6.25 mg Oral BID WC  . DISCONTD: insulin aspart  0-9 Units Subcutaneous Q4H  . DISCONTD: sodium chloride  1,000 mL Intravenous Once   Continuous Infusions: .  sodium bicarbonate infusion sterile water 1000 mL 250 mL/hr at 03/30/12 0416   Followed by  .  sodium bicarbonate infusion sterile water 1000 mL 150 mL/hr at 03/31/12 0248   PRN Meds:.sodium chloride, sodium chloride, sodium chloride, sodium chloride, acetaminophen, feeding supplement (NEPRO CARB STEADY), feeding supplement (NEPRO CARB STEADY), heparin, heparin, lidocaine, lidocaine, lidocaine-prilocaine, lidocaine-prilocaine, loperamide, ondansetron (ZOFRAN) IV, ondansetron, pentafluoroprop-tetrafluoroeth, pentafluoroprop-tetrafluoroeth, traMADol  Assessment/Plan:  1. ARF: Creatinine 18 on admission, and pt reported no uop in the 4 days prior to admission. He also had increased liquid ostomy output and emesis over the week prior to admission. Unfortunately a CT abd/pelvis with contrast was obtained in the ED, which likely worsened his renal failure. Renal was consulted and pt aggressively hydrated with bicarb in sterile water per Nephrology, which was then changed to normal saline. HD x2 since 10/29- No HD yesterday or today. Cr 5.68 w/ GFR of 11 this morning. - F/u w/ Neph - am labs per Nephrology - Monitor uop - NS@50cc /hr, will d/c in am if po intake adequate  2. Colon adenocarcinoma s/p right hemicolectomy: Pt with unfortunate anastomotic leak with intraabdominal abscess formation s/p right hemicolectomy in September. He was taken back to the OR for multiple washouts, and his abdomen was closed on 02/26/12. He had a wound vac in place, which was removed at d/c on 03/10/12. The lower half of the wound healed, but the upper half is  still healing and is open superficially with visible fascial sutures. Fibrinous material is present in the wound on admission to the hospital for ARF. Purulent drainage in the inferior portion of the wound, and leukocytosis to 12.2, and febrile to 101.2 10/31. Vanc and Unasyn started 10/31. Blood and urine cx pending. General Surgery to look at the wound. Wound Care consulted on admission, but has deferred to General Surgery for management. - F/u Cx - Continue Unasyn and Vancomycin per Pharmacy - am CBC - F/u w/ General Surgery for further wound care/recommendations  3. Protein Gap:  Given the patient's age, gender, race, protein gap >4, weight loss, renal failure, and recent anemia with rouleaux on diff, multiple myeloma needed to be ruled out. Free Kappa elevated to 72.6, Free Lambda elevated to 20.6, ratio normal at 3.52, no evidence of a monoclonal spike, unlikely multiple myeloma.  4 Hyperkalemia: Potassium 6.5 on admission, but pt very dehydrated. After IVF started, am BMP with K+ 4.5 --> 4.7 --> 3.4 -->3.7-->3.7-->4.1 today. Will continue to hydrate and monitor potassium levels.  - Renal panel in the am  5. Stage 2 decubitus ulcer: Present on admission. Wound Care consulted, and they recommended silicone foam dressings. No further intervention needed per WC.  6. DVT PPx: [place] Heparin    LOS: 5 days   Otho Bellows 04/03/2012, 11:01 AM

## 2012-04-03 NOTE — Progress Notes (Signed)
Subjective: Interval History: has complaints passing water every 2 hours.  Objective: Vital signs in last 24 hours: Temp:  [98.1 F (36.7 C)-101.7 F (38.7 C)] 98.3 F (36.8 C) (11/01 0432) Pulse Rate:  [81-87] 87  (11/01 0432) Resp:  [18-20] 20  (11/01 0432) BP: (110-126)/(64-77) 126/77 mmHg (11/01 0432) SpO2:  [98 %-100 %] 100 % (11/01 0432) Weight:  [97.1 kg (214 lb 1.1 oz)] 97.1 kg (214 lb 1.1 oz) (11/01 0432) Weight change: 0.2 kg (7.1 oz)  Intake/Output from previous day: 10/31 0701 - 11/01 0700 In: 1761.7 [P.O.:480; I.V.:1181.7; IV Piggyback:100] Out: 1345 [Urine:650; Stool:695] Intake/Output this shift:    General appearance: alert, cooperative and no distress Resp: clear to auscultation bilaterally Cardio: S1, S2 normal and systolic murmur: holosystolic 2/6, blowing at apex GI: pos bs, liver down 4 cm. Ostomy R mid abdm, wound midline Extremities: extremities normal, atraumatic, no cyanosis or edema R fem cath  Lab Results:  [facility] 04/03/12 0656 04/02/12 0549  WBC 12.2* 12.2*  HGB 9.2* 9.2*  HCT 27.5* 27.3*  PLT 123* 140*   BMET:  Basename 04/03/12 0656 04/02/12 0549  NA 135 135  K 4.1 3.7  CL 99 99  CO2 27 27  GLUCOSE 106* 97  BUN 39* 44*  CREATININE 5.68* 6.50*  CALCIUM 7.3* 6.9*   No results found for this basename: PTH:2 in the last 72 hours Iron Studies: No results found for this basename: IRON,TIBC,TRANSFERRIN,FERRITIN in the last 72 hours  Studies/Results: No results found.  I have reviewed the patient's current medications.  Assessment/Plan: 1 AKI Cr lower off HD, vol/K. Acid.base ok.  If cont Cr lower in am , d/c fem cath . Maintaining self with po intake better, can d/c ivf 2 Anemia stable 3 Nurtition suppl 4 DM controlled 5 Fever per primary P follow Cr, ? D/c ivf, if Cr better in am D/c fem cath    LOS: 5 days   [name] 04/03/2012,8:42 AM

## 2012-04-03 NOTE — Progress Notes (Signed)
Physical Therapy Treatment Patient Details Name: [name] MRN: [id number] DOB: November 04, 1950 Today's Date: 04/03/2012 Time: [id number] PT Time Calculation (min): 23 min  PT Assessment / Plan / Recommendation Comments on Treatment Session  Admitted with acute renal failure; Pt. continues to have difficulty following percations for temporary HD femoral catheter. Recommend trying walker with sit -> stand and stand pivot transfer next tx due to pt. being fearful to stand with his R knee in extension. Pt. states he is afraid he might fall with his leg not under him to stand. Pt. able to state percautions for temporary HD femoral catheter but unable to carry them out with mobility.                      Plan Discharge plan remains appropriate;Frequency remains appropriate    Precautions / Restrictions Precautions Precautions: Other (comment) Precaution Comments: Temporary HD femoral catheter; No hip flexion past 90 degrees Restrictions Weight Bearing Restrictions: No   Pertinent Vitals/Pain No pain reported    Mobility  Bed Mobility Supine to Sit: 5: Supervision Details for Bed Mobility Assistance: Pt. requires supervision for safety and cues about percautions. Transfers Sit to Stand: 4: Min assist Stand to Sit: 4: Min assist Stand Pivot Transfers: 4: Min assist Details for Transfer Assistance: Pt. requires minA to maintain R knee extension during transfers and cueing for sequencing of task. Pt. still unable to maintain R knee in extension without block behind foot by PT. Ambulation/Gait Ambulation/Gait Assistance: Not tested (comment) (Unable to ambulate due to temp HD fem cath percautions)    Exercises General Exercises - Lower Extremity Long Arc Quad: AROM;Both;15 reps;Seated Hip Flexion/Marching: AROM;15 reps;Seated;Left     PT Goals Acute Rehab PT Goals PT Goal: Supine/Side to Sit - Progress: Progressing toward goal PT Goal: Sit to Stand - Progress: Progressing  toward goal PT Goal: Stand to Sit - Progress: Progressing toward goal PT Goal: Ambulate - Progress: Not progressing  Visit Information  Last PT Received On: 04/03/12 Assistance Needed: +1    Subjective Data  Subjective: I'm ready for my breakfast   Cognition  Overall Cognitive Status: Impaired Area of Impairment: Following commands;Safety/judgement (Continues to have difficulty following fem cath percautions) Arousal/Alertness: Awake/alert Orientation Level: Appears intact for tasks assessed Safety/Judgement: Decreased awareness of safety precautions       End of Session PT - End of Session Activity Tolerance: Patient tolerated treatment well Patient left: in chair;with call bell/phone within reach Nurse Communication: Mobility status     Earma Reading SPT 04/03/2012, 9:07 AM

## 2012-04-03 NOTE — Progress Notes (Signed)
Patient ID: [name], male   DOB: April 06, 1951, 61 y.o.   MRN: [id number]    Subjective: Pt without complaints.  Eating without problems, except doesn't have most of his teeth.  Objective: Vital signs in last 24 hours: Temp:  [98.1 F (36.7 C)-101.7 F (38.7 C)] 98.3 F (36.8 C) (11/01 0432) Pulse Rate:  [81-87] 87  (11/01 0432) Resp:  [18-20] 20  (11/01 0432) BP: (110-126)/(64-77) 126/77 mmHg (11/01 0432) SpO2:  [98 %-100 %] 100 % (11/01 0432) Weight:  [214 lb 1.1 oz (97.1 kg)] 214 lb 1.1 oz (97.1 kg) (11/01 0432) Last BM Date: 04/03/12  Intake/Output from previous day: 10/31 0701 - 11/01 0700 In: 1761.7 [P.O.:480; I.V.:1181.7; IV Piggyback:100] Out: 1345 [Urine:650; Stool:695] Intake/Output this shift:    PE: Abd: soft, wound is almost all clean, small amount of fibrin noted in the very superior and inferior points of the wound.  Otherwise the wound is very clean with good granulation tissue.  No evidence of infection or purulent drainage.  Expected fibrinous exudate present, ileostomy working well.  Abdomen otherwise NT  Lab Results:   [facility] 04/03/12 0656 04/02/12 0549  WBC 12.2* 12.2*  HGB 9.2* 9.2*  HCT 27.5* 27.3*  PLT 123* 140*   BMET  Basename 04/03/12 0656 04/02/12 0549  NA 135 135  K 4.1 3.7  CL 99 99  CO2 27 27  GLUCOSE 106* 97  BUN 39* 44*  CREATININE 5.68* 6.50*  CALCIUM 7.3* 6.9*   PT/INR No results found for this basename: LABPROT:2,INR:2 in the last 72 hours CMP     Component Value Date/Time   NA 135 04/03/2012 0656   K 4.1 04/03/2012 0656   CL 99 04/03/2012 0656   CO2 27 04/03/2012 0656   GLUCOSE 106* 04/03/2012 0656   BUN 39* 04/03/2012 0656   CREATININE 5.68* 04/03/2012 0656   CREATININE 1.12 07/04/2011 1516   CALCIUM 7.3* 04/03/2012 0656   PROT 6.5 03/31/2012 0510   ALBUMIN 2.1* 04/03/2012 0656   AST 24 03/31/2012 0510   ALT 13 03/31/2012 0510   ALKPHOS 77 03/31/2012 0510   BILITOT 0.8 03/31/2012 0510   GFRNONAA 10* 04/03/2012 0656     GFRAA 11* 04/03/2012 0656   Lipase     Component Value Date/Time   LIPASE 143* 03/29/2012 2143       Studies/Results: No results found.  Anti-infectives: Anti-infectives     Start     Dose/Rate Route Frequency Ordered Stop   04/02/12 1230   vancomycin (VANCOCIN) 2,000 mg in sodium chloride 0.9 % 500 mL IVPB        2,000 mg 250 mL/hr over 120 Minutes Intravenous  Once 04/02/12 1137 04/02/12 1438   04/02/12 1200   ampicillin-sulbactam (UNASYN) 1.5 g in sodium chloride 0.9 % 50 mL IVPB        1.5 g 100 mL/hr over 30 Minutes Intravenous Every 12 hours 04/02/12 1137             Assessment/Plan  1. Open abdominal wound 2. S/p multiple abdominal surgeries  Plan: 1. Wound is quite clean, no evidence of infection.  The drainage that was seen by medicine was likely some fibrinous exudate from the small amount of fibrin noted in the superior and inferior points of the wound.  His abdomen otherwise is stable.  Do not seen any reason for elevated WBC in abdomen.  Will see prn.     LOS: 5 days    Quierra Silverio E 04/03/2012, 12:55 PM  Pager: [phone]

## 2012-04-04 LAB — RENAL FUNCTION PANEL
Albumin: 1.9 g/dL — ABNORMAL LOW (ref 3.5–5.2)
BUN: 34 mg/dL — ABNORMAL HIGH (ref 6–23)
CO2: 26 mEq/L (ref 19–32)
Calcium: 7.1 mg/dL — ABNORMAL LOW (ref 8.4–10.5)
Chloride: 103 mEq/L (ref 96–112)
Creatinine, Ser: 4.56 mg/dL — ABNORMAL HIGH (ref 0.50–1.35)
GFR calc Af Amer: 15 mL/min — ABNORMAL LOW (ref >90)
GFR calc non Af Amer: 13 mL/min — ABNORMAL LOW (ref >90)
Glucose, Bld: 144 mg/dL — ABNORMAL HIGH (ref 70–99)
Phosphorus: 2.6 mg/dL (ref 2.3–4.6)
Potassium: 3.7 mEq/L (ref 3.5–5.1)
Sodium: 137 mEq/L (ref 135–145)

## 2012-04-04 LAB — CBC
HCT: 26 % — ABNORMAL LOW (ref 39.0–52.0)
Hemoglobin: 8.8 g/dL — ABNORMAL LOW (ref 13.0–17.0)
MCH: 24.9 pg — ABNORMAL LOW (ref 26.0–34.0)
MCHC: 33.8 g/dL (ref 30.0–36.0)
MCV: 73.7 fL — ABNORMAL LOW (ref 78.0–100.0)
Platelets: 121 10*3/uL — ABNORMAL LOW (ref 150–400)
RBC: 3.53 MIL/uL — ABNORMAL LOW (ref 4.22–5.81)
RDW: 18.3 % — ABNORMAL HIGH (ref 11.5–15.5)
WBC: 8.8 10*3/uL (ref 4.0–10.5)

## 2012-04-04 LAB — URINE CULTURE: Colony Count: >=100000

## 2012-04-04 LAB — GLUCOSE, CAPILLARY
Glucose-Capillary: 133 mg/dL — ABNORMAL HIGH (ref 70–99)
Glucose-Capillary: 104 mg/dL — ABNORMAL HIGH (ref 70–99)
Glucose-Capillary: 131 mg/dL — ABNORMAL HIGH (ref 70–99)

## 2012-04-04 MED ORDER — LEVOFLOXACIN 250 MG PO TABS
250.0000 mg | ORAL_TABLET | ORAL | Status: DC
  Administered 2012-04-05 – 2012-04-06 (×2): 250 mg via ORAL
  Filled 2012-04-04 (×4): qty 1

## 2012-04-04 MED ORDER — LEVOFLOXACIN 500 MG PO TABS
500.0000 mg | ORAL_TABLET | Freq: Once | ORAL | Status: AC
  Administered 2012-04-04: 500 mg via ORAL
  Filled 2012-04-04: qty 1

## 2012-04-04 MED ORDER — LEVOFLOXACIN 500 MG PO TABS: 500.0000 mg | ORAL_TABLET | Freq: Every day | ORAL | Status: DC

## 2012-04-04 NOTE — Progress Notes (Signed)
Subjective: Interval History: none.  Objective: Vital signs in last 24 hours: Temp:  [98.5 F (36.9 C)-101.2 F (38.4 C)] 98.5 F (36.9 C) (11/02 0401) Pulse Rate:  [85-86] 86  (11/02 0401) Resp:  [16-18] 16  (11/02 0401) BP: (135-145)/(74-76) 135/76 mmHg (11/02 0401) SpO2:  [98 %-99 %] 99 % (11/02 0401) Weight:  [96.7 kg (213 lb 3 oz)] 96.7 kg (213 lb 3 oz) (11/02 0622) Weight change: -0.4 kg (-14.1 oz)  Intake/Output from previous day: 11/01 0701 - 11/02 0700 In: 1617.5 [P.O.:360; I.V.:1157.5; IV Piggyback:100] Out: 1000 [Urine:800; Stool:200] Intake/Output this shift: Total I/O In: 240 [P.O.:240] Out: 200 [Urine:200]  General appearance: alert and cooperative Resp: rales LLL Cardio: regular rate and rhythm and systolic murmur: holosystolic 2/6, blowing at apex GI: ostomy R mid abdm, pos bs, wound mid abdm Extremities: extremities normal, atraumatic, no cyanosis or edema  Lab Results:  [facility] 04/04/12 0543 04/03/12 0656  WBC 8.8 12.2*  HGB 8.8* 9.2*  HCT 26.0* 27.5*  PLT 121* 123*   BMET:  Basename 04/04/12 0543 04/03/12 0656  NA 137 135  K 3.7 4.1  CL 103 99  CO2 26 27  GLUCOSE 144* 106*  BUN 34* 39*  CREATININE 4.56* 5.68*  CALCIUM 7.1* 7.3*   No results found for this basename: PTH:2 in the last 72 hours Iron Studies: No results found for this basename: IRON,TIBC,TRANSFERRIN,FERRITIN in the last 72 hours  Studies/Results: No results found.  I have reviewed the patient's current medications.  Assessment/Plan: 1 AKI improving Cr.  Vol ok.  Can D/C fem cath, mobililze 2 S/p dehisc 3 Amemia drifting down 4 DM controlled P d/c cath, push po fluids, follow Cr, mobilize    LOS: 6 days   [name] 04/04/2012,9:09 AM

## 2012-04-04 NOTE — Progress Notes (Signed)
Patient ID: [name], male   DOB: February 18, 1951, 61 y.o.   MRN: [id number] Medical Student Daily Progress Note   Subjective:    Interval Events:   Overnight [name] spiked a fever to 101.2 with an associated BP of 145/74 around 2000, acetaminophen was administered and the temperature corrected to 99.4 @ 2200, temp was 98.5 and BP was 135/76 @ 0400 11/02.  No other acute events overnight.  Pt has no new complaints today.  Upon discussing possible discharge pt was adamant about not going back to [place] stating, "they did not take care of me there." Pt is still urinating approximately every 2 hours while awake without difficulty however he does feel there is some amount of urgency, "When I feel it I have to get to the edge of the bed quick or else I'm scared I will pee on myself."  Pt denies HA, dizziness, changes in vision, chest pain, SOB, change in character of abdominal pain, weakness, numbness, and tingling.    Objective:    Vital Signs:   Temp:  [98.5 F (36.9 C)-101.2 F (38.4 C)] 98.5 F (36.9 C) (11/02 0401) Pulse Rate:  [85-86] 86  (11/02 0401) Resp:  [16-18] 16  (11/02 0401) BP: (135-145)/(74-76) 135/76 mmHg (11/02 0401) SpO2:  [98 %-99 %] 99 % (11/02 0401) Weight:  [96.7 kg (213 lb 3 oz)] 96.7 kg (213 lb 3 oz) (11/02 0622) Last BM Date: 04/03/12   Weights: Weight at admission - 89.4 24-hour Weight change: -0.4 kg (-14.1 oz)  Filed Weights   04/02/12 0537 04/03/12 0432 04/04/12 0622  Weight: 98.1 kg (216 lb 4.3 oz) 97.1 kg (214 lb 1.1 oz) 96.7 kg (213 lb 3 oz)   Net since admission:  7.3   Intake/Output:   Intake/Output Summary (Last 24 hours) at 04/04/12 0920 Last data filed at 04/04/12 0900  Gross per 24 hour  Intake 1857.5 ml  Output   1200 ml  Net  657.5 ml     Net since admission:  3551.15 mL Urine output 11/1 - 800 mL   Physical Exam: GENERAL:  alert and oriented; sleeping comfortably in bed and in no distress, pt jumped with gentle arousal  and immediately began hiccupping which continued throughout exam EYES:  pupils equal, round, and reactive to light; sclera anicteric, pale conjunctivae HENT:  NCAT, moist mucosa, throat is clean and non-erythematous, no upper dentition, decay present on bottom dentition NECK:  no JVD, mobile non-tender anterior cervical lymphadenopathy present bilaterally LUNGS:  clear to auscultation bilaterally, normal work of breathing HEART:  normal rate; regular rhythm; normal S1 and S2, no S3 or S4 appreciated; no murmurs, rubs, or clicks although difficult to assess through pt's hiccups ABDOMEN:  soft, non-tender, normal bowel sounds, no masses palpated, superficially open wound at midline packed with gauze and covered with tape upon uncovering noted fibrinous exudate at superior and inferior aspects, good granulation tissue present, intact fascial sutures present in wound, able to express some drainage from inferior aspect of wound.  Ileostomy noted in RLQ with greenish liquid stool present in bag EXTREMITIES:  No peripheral edema, dry skin noted bilaterally on lower extremities, 2+ distal pulses radial and dorsalis pedis NEURO: EOMI, eyebrow raise and eye closure intact, 5/5 bicep and tricep strength, 4+/5 hip flexion bilaterally, 4+/5 deltoid strength, 5/5 grip strength  SKIN:  Grade 2 decubitus ulcer appreciated at top of gluteal cleft    Labs: Basic Metabolic Panel:  Lab [id number] 04/03/12 [id number] 04/02/12 0549 04/01/12  0445 03/31/12 0510  NA 137 135 135 133* 131*  K 3.7 4.1 3.7 3.7 3.4*  CL 103 99 99 91* 79*  CO2 26 27 27 30  32  GLUCOSE 144* 106* 97 95 131*  BUN 34* 39* 44* 79* 147*  CREATININE 4.56* 5.68* 6.50* 10.21* 15.55*  CALCIUM 7.1* 7.3* 6.9* -- --  MG -- -- -- -- --  PHOS 2.6 2.9 3.5 6.2* 11.4*    Liver Function Tests:  Lab 04/04/12 0543 04/03/12 0656 04/02/12 0549 04/01/12 0445 03/31/12 0510 03/30/12 0450 03/29/12 2143  AST -- -- -- -- 24 26 33  ALT -- -- -- -- 13 18 23     ALKPHOS -- -- -- -- 77 104 130*  BILITOT -- -- -- -- 0.8 0.8 1.0  PROT -- -- -- -- 6.5 8.1 9.7*  ALBUMIN 1.9* 2.1* 2.2* 2.3* 2.3* -- --    Lab 03/29/12 2143  LIPASE 143*  AMYLASE --   CBC:  Lab 04/04/12 0543 04/03/12 0656 04/02/12 0549 04/01/12 0445 03/31/12 1000 03/31/12 0510 03/29/12 2143  WBC 8.8 12.2* 12.2* 10.1 9.2 -- --  NEUTROABS -- -- -- -- -- 5.2 10.1*  HGB 8.8* 9.2* 9.2* 9.6* 9.8* -- --  HCT 26.0* 27.5* 27.3* 28.3* 27.7* -- --  MCV 73.7* 73.9* 73.2* 72.4* 69.6* -- --  PLT 121* 123* 140* 160 195 -- --   CBG:  Lab 04/04/12 0758 04/03/12 2130 04/03/12 1742 04/03/12 1221 04/03/12 0755  GLUCAP 133* 96 182* 170* 137*   Microbiology: Results for orders placed during the hospital encounter of 03/29/12  MRSA PCR SCREENING     Status: Normal   Collection Time   03/30/12  3:18 AM      Component Value Range Status Comment   MRSA by PCR NEGATIVE  NEGATIVE Final   URINE CULTURE     Status: Normal   Collection Time   03/30/12  3:45 AM      Component Value Range Status Comment   Specimen Description URINE, CATHETERIZED   Final    Special Requests NONE   Final    Culture  Setup Time 03/30/2012 04:33   Final    Colony Count NO GROWTH   Final    Culture NO GROWTH   Final    Report Status 03/31/2012 FINAL   Final   CULTURE, BLOOD (ROUTINE X 2)     Status: Normal (Preliminary result)   Collection Time   04/02/12 12:34 PM      Component Value Range Status Comment   Specimen Description BLOOD LEFT ARM   Final    Special Requests BOTTLES DRAWN AEROBIC AND ANAEROBIC 10CC   Final    Culture  Setup Time 04/02/2012 16:59   Final    Culture     Final    Value:        BLOOD CULTURE RECEIVED NO GROWTH TO DATE CULTURE WILL BE HELD FOR 5 DAYS BEFORE ISSUING A FINAL NEGATIVE REPORT   Report Status PENDING   Incomplete   CULTURE, BLOOD (ROUTINE X 2)     Status: Normal (Preliminary result)   Collection Time   04/02/12 12:36 PM      Component Value Range Status Comment   Specimen  Description BLOOD LEFT HAND   Final    Special Requests BOTTLES DRAWN AEROBIC AND ANAEROBIC 10CC   Final    Culture  Setup Time 04/02/2012 16:59   Final    Culture     Final  Value:        BLOOD CULTURE RECEIVED NO GROWTH TO DATE CULTURE WILL BE HELD FOR 5 DAYS BEFORE ISSUING A FINAL NEGATIVE REPORT   Report Status PENDING   Incomplete   URINE CULTURE     Status: Normal (Preliminary result)   Collection Time   04/02/12  1:15 PM      Component Value Range Status Comment   Specimen Description URINE, CLEAN CATCH   Final    Special Requests NONE   Final    Culture  Setup Time 04/03/2012 01:37   Final    Colony Count >=100,000 COLONIES/ML   Final    Culture GRAM NEGATIVE RODS   Final    Report Status PENDING   Incomplete     Imaging:  CT Abdomen with Contrast 10/27: IMPRESSION:  1. 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 2. Extensive infiltration of omental fat, worse on the left, could be due to infectious or inflammatory process or postoperative change. Omental tumor implants are possible but felt somewhat less likely. No abscess is identified. 3. Fatty infiltration of the liver. 4. Small lymph node in the porta hepatis is nonspecific and likely reactive. Recommend attention on follow-up exams. 5. Small gallstones without evidence of cholecystitis.  Renal US 10/27: IMPRESSION:  1. No hydronephrosis. Slight increased echogenicity of the renal parenchyma consistent with renal medical disease. 2. Small benign-appearing cysts on both kidneys.    Medications:    Infusions:    . sodium chloride 50 mL/hr at 04/03/12 2020    Scheduled Medications:    . ampicillin-sulbactam (UNASYN) IV  1.5 g Intravenous Q12H  . antiseptic oral rinse  15 mL Mouth Rinse BID  . atorvastatin  20 mg Oral QHS  . feeding supplement  1 Container Oral TID BM  . heparin  40 Units/kg Dialysis Once in dialysis  . heparin  5,000 Units  Subcutaneous Q8H  . insulin aspart  0-9 Units Subcutaneous TID AC & HS  . insulin glargine  10 Units Subcutaneous QHS  . levofloxacin  250 mg Oral Q24H  . levofloxacin  500 mg Oral Once  . multivitamin  1 tablet Oral Daily  . sodium chloride  3 mL Intravenous Q12H  . DISCONTD: levofloxacin  500 mg Oral Daily     PRN Medications: sodium chloride, sodium chloride, sodium chloride, sodium chloride, acetaminophen, feeding supplement (NEPRO CARB STEADY), feeding supplement (NEPRO CARB STEADY), heparin, heparin, lidocaine, lidocaine, lidocaine-prilocaine, lidocaine-prilocaine, loperamide, ondansetron (ZOFRAN) IV, ondansetron, pentafluoroprop-tetrafluoroeth, pentafluoroprop-tetrafluoroeth, traMADol    Assessment/ Plan:    [name] is a 61 yo gentleman with a history of type 2 diabetes on insulin, invasive adenocarcinoma of colon s/p right hemicolectomy complicated by postop intra-abdominal abscess with a leaking anastamosis, OSA, hypertension HD #6 being treated for acute renal failure.   Acute renal failure: Creatinine 4.56, BUN 34 as of 11/2 (baseline Cr 0.9-1)  Likely secondary to extreme volume depletion in the setting of nausea, vomiting, increased ileostomy output. On 02/05/12 he weighed 256 lbs today he weighs 214. Appears less volume depleted on exam. BMP values improving. Anion gap is 13. Metabolic acidosis likely due to renal failure. Renal US showed no hydronephrosis, only bilateral cysts. -Dialysis x2  starting 10/29, held 10/31 -Labs continue to improve after holding dialysis 10/31, will follow Renal's recommendations pertaining to further need for dialysis  -Pt continues to make urine (800 mL 11/1) -Continue fluids NS at 50cc/hr  -Strict I and O's, daily  weights  -Zofran PRN  -Follow BMP   Leukocytosis: Pt's WBC count has decreased to 8.8. Potential sources of initial infection include indwelling foley catheter (dc'd 10/31), and an abdominal source given his ileostomy and multiple  bowel surgeries.  -UA showed turbid appearance, leukocytes, and many bacteria  -Urine culture preliminary results showed >100K colonies of gram negative rods  -Blood cultures have shown no growth to date  - Consider dc vancomycin and ampicillin/sulbactam and starting levofloxacin given its better coverage of gram negative organisms day # 3 of abx therapy  Colon adenocarcinoma: S/p right hemicolectomy and ileostomy creation  WBC 12.2 as of 11/1, temperature spikes to 101.7 and 100.6 10/31, hx of abdominal abscess 2/2 leaking anastamosis. CT scan abd/pelvis with inflammation of omental fat, no focal abscess noted, no obstruction. Pt with abdominal pain around incision, no rebound or guarding. Ileostomy site appears clear, +output. Does not appear septic. Physical exam showed increased purulence from abdominal incision this plus increased WBC suggests potential infection.  -Surgery saw pt 11/1, they considered the drainage to be normal fibrinous exudate and did not recommend any further action   -Continue wound care with daily wet to dry dressings  -Imodium to correct loose stools -follow CBC, vitals  -Acetaminophen and tramadol prn for analgesia   Anemia: Hgb - 8.8, Hct - 26, MCV 73.7, platelets 121. Potentially a result of HD catheter placement, HD, and aggressive fluid rehydration. Also could be a combination of chronic inflammation (he has had cancer and multiple surgeries in his recent past) and decreased iron intake (unclear on what his diet was like outside of the hospital but intake has been poor during this admission)   -No intervention at this time   Proximal Muscle Weakness: Noted during physical exam 11/1 (3+/5 hip flexion, 4/5 deltoid strength) likely due to deconditioning pt has largely been immobile for an extended period of time.  -PT is working with the patient   Increased Protein Gap:  Given the patient's age, gender, race, protein gap >4, weight loss, renal failure, and recent  anemia, along with his initial finding of rouleaux formation multiple myeloma should be considered. He does have explanations for these symptoms (severe dehydration can cause rouleaux, anemia could be due to recent start of hemodialysis and has been receiving aggressive rehydration, he is not hypercalcemic, he has had multiple surgeries and decreased activity level which could contribute to weight loss)  -Free Kappa increased at 72.6, Free Lambda increased to 20.6, ratio normal at 3.52, no evidence of a monoclonal spike  -Unlikely to be multiple myeloma given these results   Hyperkalemia: K 3.7 as of 11/2  Pt was hyperkalemic at admission with ECG changes that resolved with sodium polystyrene sulfonate. Since admission his K has decreased to wnl. Likely due to GI losses and NPO status iv fluids have not had K in them.  -Has since resolved   Decubitus Ulcer: Grade 2 located midline of superior aspect of gluteal cleft, appears improved from admission less erythematous -Wound care recommends foam dressing  -encouraged regular turns in bed to offload  -Maintain nutrition with regular meals and supplementation   Diabetes:  -hold metformin as pt with metabolic acidosis and renal failure  -continue lantus 10 units daily with correction scale  -Check blood sugar every 4 hours   Hypotension:  Last episode on 10/31 (101/53). Pt's BP's have been normal since.  -NS running at 50 cc/hr  -continue PO intake   Hyperlipidemia:  -Continue home atorvastatin  -Goal of  LDL <70   VTE Prophylaxis:  -heparin [name]   Diet:  -Continue renal diet but try to decrease amount of carbohydrates with meals  -Due to poor dentition encouraged pt to order foods that he knows he can chew -Encourage pt to eat (the percent of meal eaten has been low)  -Pt fluid restricted to 1200 mL per day   Dispo:  -Pt is adamant about not going back to [place], states that his daughters will take him home and arrange for  care -Social work is following the pt and is aware of the situation      Length of Stay: 6 days   This is a Careers information officer Note.  The care of the patient was discussed with Dr. Michail Sermon and the assessment and plan formulated with their assistance.  Please see their attached note or addendum for official documentation of the daily encounter.

## 2012-04-04 NOTE — Progress Notes (Signed)
Resident Addendum to Medical Student Note   I have seen and examined the patient, and agree with the the medical student assessment and plan outlined above. Please see my brief note below for additional details.  S: Doing well, sitting upright on side of bed eating breakfast, no hiccups on exam this morning, Tmx 101.2 overnight on Vanc and Unasyn   OBJECTIVE: VS: Reviewed  Meds: Reviewed  Labs: Reviewed  Imaging: Reviewed   Physical Exam: General: Sitting on side of bed eating breakfast, NAD  Cardiac: RRR, no rubs, murmurs or gallops appreciated Pulm: Clear to auscultation bilaterally, no wheezes, rales, or rhonchi appreciated Abd: Soft, mild TTP, wound inact with good granulation tissue, nondistended, BS present, ostomy with brownish liquid output  Ext: 2+ pulses. Warm and well perfused, no pedal edema  Neuro: Alert and oriented X3  ASSESSMENT/ PLAN:  1. Acute Kidney Injury: s/p HD x2, creatinine trending towards baseline nicely with appropriate urine output and good po intake, fem cath to be removed today per Renal -d/c IV fluids, cont to encourage oral intake -cont to trend kidney function   Lab 04/04/12 0543 04/03/12 0656 04/02/12 0549 04/01/12 0445 03/31/12 0510  CREATININE 4.56* 5.68* 6.50* 10.21* 15.55*    2. Colon adenocarcinoma s/p right hemicolectomy: Prior anastomotic leak with intraabdominal abscess formation s/p right hemicolectomy in September with return to OR for multiple washouts, and subsequent abdominal wound closure on  02/26/12. S/p prior wound vac in place which was d/c on 03/10/12. The lower half of the wound healed, but the upper half is still healing and is open superficially with visible fascial sutures. Surgery re-evaluated pt yesterday and noted no evidence of infection with drainage likely secondary to fibrinous exudate. Pt placed on Vancomycin and Unasyn on 10/31 when pt became febrile, continues to have fevers with negative blood cultures.  Urine cultures  returned positive thus likely source of his leukocytosis. - F/u Cx  - d/c Vanc and Unasyn and cont to monitor -cont trend WBC - appreciate General Surgery for wound care/recommendations   3. Gram Negative Rods Urinary Tract Infection: likely source of prior SIRS developed on 10/31, good UOP w/o foley placed, started renally dosed Levofloxacin for 2 week course today, will continue to monitor temperature and resolution of leukocytosis of of Vancomycin and Zosyn. -cont Levafloxacin 250 mg oral x 10 days which has better gram negative coverage than Unasyn -f/u Urine Cx for speciation  Urinalysis    Component Value Date/Time   COLORURINE YELLOW 04/02/2012 1315   APPEARANCEUR TURBID* 04/02/2012 1315   LABSPEC 1.016 04/02/2012 1315   PHURINE 6.0 04/02/2012 1315   GLUCOSEU 100* 04/02/2012 1315   HGBUR LARGE* 04/02/2012 [place] 04/02/2012 [place] 04/02/2012 1315   PROTEINUR 100* 04/02/2012 1315   UROBILINOGEN 0.2 04/02/2012 1315   NITRITE NEGATIVE 04/02/2012 1315   LEUKOCYTESUR LARGE* 04/02/2012 1315     4. Hyperkalemia: resolved  Lab 04/04/12 0543 04/03/12 0656 04/02/12 0549 04/01/12 0445 03/31/12 0510  K 3.7 4.1 3.7 3.7 3.4*    5. Stage 2 decubitus ulcer: Present on admission. In setting of malnutrition related to suboptimal oral intake. No further intervention needed per WC.  -cont silicone foam dressings per Wound Care recs -cont dietician recs including [facility]  6. DVT PPx: [place] Heparin  7. Disposition: pt refuses return to SNF, states that he will have 24h care via his 3 daughters, unfortunately he is uninsured thus likely will not be able to procure Home  Health Services -appreciate SW and Case Management recs  Length of Stay: 6   Dorian Heckle, MD PGY2, Internal Medicine Resident 04/04/2012, 11:38 AM

## 2012-04-05 DIAGNOSIS — B9620 Unspecified Escherichia coli [E. coli] as the cause of diseases classified elsewhere: Secondary | ICD-10-CM | POA: Diagnosis not present

## 2012-04-05 DIAGNOSIS — N390 Urinary tract infection, site not specified: Secondary | ICD-10-CM

## 2012-04-05 DIAGNOSIS — A498 Other bacterial infections of unspecified site: Secondary | ICD-10-CM

## 2012-04-05 LAB — RENAL FUNCTION PANEL
Albumin: 1.8 g/dL — ABNORMAL LOW (ref 3.5–5.2)
BUN: 29 mg/dL — ABNORMAL HIGH (ref 6–23)
CO2: 23 mEq/L (ref 19–32)
Calcium: 7.4 mg/dL — ABNORMAL LOW (ref 8.4–10.5)
Chloride: 103 mEq/L (ref 96–112)
Creatinine, Ser: 3.6 mg/dL — ABNORMAL HIGH (ref 0.50–1.35)
GFR calc Af Amer: 20 mL/min — ABNORMAL LOW (ref >90)
GFR calc non Af Amer: 17 mL/min — ABNORMAL LOW (ref >90)
Glucose, Bld: 102 mg/dL — ABNORMAL HIGH (ref 70–99)
Phosphorus: 1.9 mg/dL — ABNORMAL LOW (ref 2.3–4.6)
Potassium: 3.5 mEq/L (ref 3.5–5.1)
Sodium: 134 mEq/L — ABNORMAL LOW (ref 135–145)

## 2012-04-05 LAB — GLUCOSE, CAPILLARY
Glucose-Capillary: 79 mg/dL (ref 70–99)
Glucose-Capillary: 92 mg/dL (ref 70–99)
Glucose-Capillary: 152 mg/dL — ABNORMAL HIGH (ref 70–99)
Glucose-Capillary: 94 mg/dL (ref 70–99)
Glucose-Capillary: 97 mg/dL (ref 70–99)

## 2012-04-05 LAB — CBC
HCT: 24.4 % — ABNORMAL LOW (ref 39.0–52.0)
Hemoglobin: 8.2 g/dL — ABNORMAL LOW (ref 13.0–17.0)
MCH: 24.6 pg — ABNORMAL LOW (ref 26.0–34.0)
MCHC: 33.6 g/dL (ref 30.0–36.0)
MCV: 73.1 fL — ABNORMAL LOW (ref 78.0–100.0)
Platelets: 126 10*3/uL — ABNORMAL LOW (ref 150–400)
RBC: 3.34 MIL/uL — ABNORMAL LOW (ref 4.22–5.81)
RDW: 18 % — ABNORMAL HIGH (ref 11.5–15.5)
WBC: 7.9 10*3/uL (ref 4.0–10.5)

## 2012-04-05 MED ORDER — GLUCOSE-VITAMIN C 4-6 GM-MG PO CHEW
CHEWABLE_TABLET | ORAL | Status: AC
  Filled 2012-04-05: qty 1

## 2012-04-05 MED ORDER — WHITE PETROLATUM GEL
Status: AC
  Administered 2012-04-05: 09:00:00
  Filled 2012-04-05: qty 5

## 2012-04-05 NOTE — Progress Notes (Signed)
Resident Addendum to Medical Student Note   I have seen and examined the patient, and agree with the the medical student assessment and plan outlined above. Please see my brief note below for additional details.  S: Afebrile overnight, Day #2 Levafloxacin for UTI, cont to refuse SNF   OBJECTIVE: VS: Reviewed  Meds: Reviewed  Labs: Reviewed  Imaging: Reviewed   Physical Exam: General: lying in bed on phone with daughter Joseph Art, NAD  Pulm: no resp distress Abd: bandages c/d/i Ext:no pedal edema  Neuro: Alert and oriented X3  ASSESSMENT/ PLAN:  1. Acute Kidney Injury: s/p HD x2, creatinine continues to trend towards baseline nicely with appropriate urine output and good po intake, fem cath removed per Renal -cont to encourage oral intake -cont to trend kidney function   Lab 04/05/12 0503 04/04/12 0543 04/03/12 0656 04/02/12 0549 04/01/12 0445  CREATININE 3.60* 4.56* 5.68* 6.50* 10.21*    2. Colon adenocarcinoma s/p right hemicolectomy: Prior anastomotic leak with intraabdominal abscess formation s/p right hemicolectomy in September with return to OR for multiple washouts, and subsequent abdominal wound closure on  02/26/12. S/p prior wound vac in place which was d/c on 03/10/12. The lower half of the wound healed, but the upper half is still healing and is open superficially with visible fascial sutures. Surgery re-evaluated pt and noted no evidence of infection with drainage likely secondary to fibrinous exudate. Pt placed on Vancomycin and Unasyn on 10/31 when pt became febrile and continued to have fevers in setting of negative blood cultures.  Urine cultures returned positive for E.coli >100K and pt afebrile since starting levafloxacin. -cont trend WBC and temp - appreciate General Surgery for wound care/recommendations    Lab 04/05/12 0503 04/04/12 0543 04/03/12 0656 04/02/12 0549 04/01/12 0445  WBC 7.9 8.8 12.2* 12.2* 10.1    3. E.coli Urinary Tract Infection: likely source  of prior SIRS developed on 10/31, good UOP w/o foley placed, started renally dosed Levofloxacin for 2 week course today, will continue to monitor temperature and resolution of leukocytosis off of Vancomycin and Unasyn. -cont Levafloxacin 250 mg oral x 10 days which has better gram negative coverage than Unasyn  Urinalysis    Component Value Date/Time   COLORURINE YELLOW 04/02/2012 1315   APPEARANCEUR TURBID* 04/02/2012 1315   LABSPEC 1.016 04/02/2012 1315   PHURINE 6.0 04/02/2012 1315   GLUCOSEU 100* 04/02/2012 1315   HGBUR LARGE* 04/02/2012 1315   [place] 04/02/2012 [place] 04/02/2012 1315   PROTEINUR 100* 04/02/2012 1315   UROBILINOGEN 0.2 04/02/2012 1315   NITRITE NEGATIVE 04/02/2012 1315   LEUKOCYTESUR LARGE* 04/02/2012 1315     4. Hyperkalemia: resolved  Lab 04/05/12 0503 04/04/12 0543 04/03/12 0656 04/02/12 0549 04/01/12 0445  K 3.5 3.7 4.1 3.7 3.7    5. Stage 2 decubitus ulcer: Present on admission. In setting of malnutrition related to suboptimal oral intake. No further intervention needed per WC.  -cont silicone foam dressings per Wound Care recs -cont dietician recs including [facility]  6. DVT PPx: [place] Heparin  7. Disposition: pt refuses return to SNF, states that he will have 24h care via his 3 daughters, spoke with daughter Joseph Art today who assured me that she and her sister Brayton Layman would be caring for pt. unfortunately he is uninsured thus likely will not be able to procure [place] -appreciate SW and Case Management recs -discharge tomorrow  (Monday) -daughter Joseph Art requesting letter for her College files stating that she has to  provide care for father -f/u in [facility] in 1 week  Length of Stay: 7   Dorian Heckle, MD PGY2, Internal Medicine Resident 04/05/2012, 11:47 AM

## 2012-04-05 NOTE — Progress Notes (Signed)
Subjective: Interval History: none.  Objective: Vital signs in last 24 hours: Temp:  [98 F (36.7 C)-99 F (37.2 C)] 98.2 F (36.8 C) (11/03 0515) Pulse Rate:  [74-78] 74  (11/03 0515) Resp:  [18-20] 18  (11/03 0515) BP: (118-122)/(71-75) 118/71 mmHg (11/03 0515) SpO2:  [97 %-99 %] 98 % (11/03 0515) Weight:  [96.4 kg (212 lb 8.4 oz)] 96.4 kg (212 lb 8.4 oz) (11/03 0515) Weight change: -0.3 kg (-10.6 oz)  Intake/Output from previous day: 11/02 0701 - 11/03 0700 In: 720 [P.O.:720] Out: 2700 [Urine:1250; Stool:1450] Intake/Output this shift:    General appearance: alert, cooperative and appears stated age Resp: clear to auscultation bilaterally Cardio: S1, S2 normal and systolic murmur: holosystolic 2/6, blowing at apex GI: pos bs, liver down 4 cm, ostomy R mid abdm, wound midline, clean Extremities: extremities normal, atraumatic, no cyanosis or edema  Lab Results:  [facility] 04/05/12 0503 04/04/12 0543  WBC 7.9 8.8  HGB 8.2* 8.8*  HCT 24.4* 26.0*  PLT 126* 121*   BMET:  Basename 04/05/12 0503 04/04/12 0543  NA 134* 137  K 3.5 3.7  CL 103 103  CO2 23 26  GLUCOSE 102* 144*  BUN 29* 34*  CREATININE 3.60* 4.56*  CALCIUM 7.4* 7.1*   No results found for this basename: PTH:2 in the last 72 hours Iron Studies: No results found for this basename: IRON,TIBC,TRANSFERRIN,FERRITIN in the last 72 hours  Studies/Results: No results found.  I have reviewed the patient's current medications.  Assessment/Plan: 1 AKI improving steadily.  Needs to push po intake 2 Anemia should resolve 3 DM controlled 4 S/p wound dehisc resolve. P Can send home from my standpoint and f/u outpatient.  He says he is followed in clinic here.  I would be glad to see to make sure resolves.  Will S/O    LOS: 7 days   Charice Zuno L 04/05/2012,7:25 AM

## 2012-04-05 NOTE — Progress Notes (Signed)
Patient ID: [name], male   DOB: 08/10/1950, 61 y.o.   MRN: [id number]    Subjective: Pt without complaints.  Eating without problems, denies any significant abd pain  Objective: Vital signs in last 24 hours: Temp:  [98 F (36.7 C)-99 F (37.2 C)] 98.2 F (36.8 C) (11/03 0515) Pulse Rate:  [74-78] 74  (11/03 0515) Resp:  [18-20] 18  (11/03 0515) BP: (118-122)/(71-75) 118/71 mmHg (11/03 0515) SpO2:  [97 %-99 %] 98 % (11/03 0515) Weight:  [212 lb 8.4 oz (96.4 kg)] 212 lb 8.4 oz (96.4 kg) (11/03 0515) Last BM Date: 04/04/12  Intake/Output from previous day: 11/02 0701 - 11/03 0700 In: 720 [P.O.:720] Out: 2700 [Urine:1250; Stool:1450] Intake/Output this shift: Total I/O In: 240 [P.O.:240] Out: 320 [Urine:120; Stool:200]  PE: Abd: soft, wound is almost all clean, small amount of fibrin noted in the very superior and inferior points of the wound.  Otherwise the wound is very clean with good granulation tissue.  No evidence of infection or purulent drainage.  ileostomy working well.  Abdomen otherwise NT  Lab Results:   [facility] 04/05/12 0503 04/04/12 0543  WBC 7.9 8.8  HGB 8.2* 8.8*  HCT 24.4* 26.0*  PLT 126* 121*   BMET  Basename 04/05/12 0503 04/04/12 0543  NA 134* 137  K 3.5 3.7  CL 103 103  CO2 23 26  GLUCOSE 102* 144*  BUN 29* 34*  CREATININE 3.60* 4.56*  CALCIUM 7.4* 7.1*   PT/INR No results found for this basename: LABPROT:2,INR:2 in the last 72 hours CMP     Component Value Date/Time   NA 134* 04/05/2012 0503   K 3.5 04/05/2012 0503   CL 103 04/05/2012 0503   CO2 23 04/05/2012 0503   GLUCOSE 102* 04/05/2012 0503   BUN 29* 04/05/2012 0503   CREATININE 3.60* 04/05/2012 0503   CREATININE 1.12 07/04/2011 1516   CALCIUM 7.4* 04/05/2012 0503   PROT 6.5 03/31/2012 0510   ALBUMIN 1.8* 04/05/2012 0503   AST 24 03/31/2012 0510   ALT 13 03/31/2012 0510   ALKPHOS 77 03/31/2012 0510   BILITOT 0.8 03/31/2012 0510   GFRNONAA 17* 04/05/2012 0503   GFRAA 20*  04/05/2012 0503   Lipase     Component Value Date/Time   LIPASE 143* 03/29/2012 2143       Studies/Results: No results found.  Anti-infectives: Anti-infectives     Start     Dose/Rate Route Frequency Ordered Stop   04/05/12 0800   levofloxacin (LEVAQUIN) tablet 250 mg        250 mg Oral Every 24 hours 04/04/12 0733 04/14/12 0759   04/04/12 1000   levofloxacin (LEVAQUIN) tablet 500 mg  Status:  Discontinued        500 mg Oral Daily 04/04/12 0722 04/04/12 0732   04/04/12 0800   levofloxacin (LEVAQUIN) tablet 500 mg        500 mg Oral  Once 04/04/12 0733 04/04/12 1021   04/02/12 1230   vancomycin (VANCOCIN) 2,000 mg in sodium chloride 0.9 % 500 mL IVPB        2,000 mg 250 mL/hr over 120 Minutes Intravenous  Once 04/02/12 1137 04/02/12 1438   04/02/12 1200   ampicillin-sulbactam (UNASYN) 1.5 g in sodium chloride 0.9 % 50 mL IVPB  Status:  Discontinued        1.5 g 100 mL/hr over 30 Minutes Intravenous Every 12 hours 04/02/12 1137 04/04/12 1123           Assessment/Plan  1. Open abdominal wound 2. S/p multiple abdominal surgeries  Plan: 1. Wound is still relatively clean, no evidence of infection.  The drainage that was seen by medicine was likely some fibrinous exudate from the small amount of fibrin noted in the superior and inferior points of the wound.  His abdomen otherwise is stable.  Do not seen any reason for elevated WBC in abdomen.  Will see prn.     LOS: 7 days    Abdulah Iqbal 04/05/2012, 9:29 AM

## 2012-04-05 NOTE — Progress Notes (Signed)
Patient examined and I agree with the assessment and plan  Georganna Skeans, MD, MPH, FACS Pager: [phone]  04/05/2012 1:04 PM

## 2012-04-05 NOTE — Progress Notes (Signed)
Patient ID: [name], male   DOB: February 03, 1951, 61 y.o.   MRN: [id number] Medical Student Daily Progress Note   Subjective:    Interval Events:   No acute events overnight.  [name] states today that he is feeling much better and is ready to go home.  He reported he plans to go home to be taken care of by his daughters.  He continues to be adamant about his refusal to return to [place] to the point of becoming tearful about the way he was treated there.  Pt denies HA, nasal congestion, fever, chills, SOB, abdominal pain, dysuria, and weakness/numbness/tingling in extremities.    Objective:    Vital Signs:   Temp:  [98 F (36.7 C)-99 F (37.2 C)] 98.2 F (36.8 C) (11/03 0515) Pulse Rate:  [74-78] 74  (11/03 0515) Resp:  [18-20] 18  (11/03 0515) BP: (118-122)/(71-75) 118/71 mmHg (11/03 0515) SpO2:  [97 %-99 %] 98 % (11/03 0515) Weight:  [96.4 kg (212 lb 8.4 oz)] 96.4 kg (212 lb 8.4 oz) (11/03 0515) Last BM Date: 04/04/12   Weights: 24-hour Weight change: -0.3 kg (-10.6 oz)  Filed Weights   04/03/12 0432 04/04/12 0622 04/05/12 0515  Weight: 97.1 kg (214 lb 1.1 oz) 96.7 kg (213 lb 3 oz) 96.4 kg (212 lb 8.4 oz)   Net since admission:  7 kg   Intake/Output:   Intake/Output Summary (Last 24 hours) at 04/05/12 1105 Last data filed at 04/05/12 0800  Gross per 24 hour  Intake    720 ml  Output   1820 ml  Net  -1100 ml     Net since admission:  1141.17   Physical Exam: GENERAL:  alert and oriented; resting comfortably in bed and in no distress EYES:  pupils equal, round, and reactive to light; sclera anicteric ENT:  moist mucosa, poor dentition (no upper dentition, large amount of decay on bottom teeth) NECK:  no JVD, no lymphadenopathy palpated LUNGS:  clear to auscultation bilaterally, normal work of breathing HEART:  normal rate; regular rhythm; normal S1 and S2, no S3 or S4 appreciated; II/VI systolic murmur noted at upper right sternal border, no rubs, or  clicks appreciated ABDOMEN:  soft, non-tender, normal bowel sounds, no masses palpated, 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 with liquid stool present, superficially open midline incision covered in bandage, uncovering revealed a clean wound with good granulation tissue non-tender to palpation and unable to express fluid from wound NEURO: EOMI, tongue protrudes to midline, palate rises symmetrically, sensation to light touch intact in face and extremities, 2+ reflexes bilaterally (biceps, patellar) EXTREMITIES:  No peripheral edema, 2+ radial and dorsalis pedis pulses SKIN:  normal turgor, dry skin on upper and lower extremities    Labs: Basic Metabolic Panel:  Lab [id number] 04/04/12 0543 04/03/12 0656 04/02/12 0549 04/01/12 0445  NA 134* 137 135 135 133*  K 3.5 3.7 4.1 3.7 3.7  CL 103 103 99 99 91*  CO2 23 26 27 27 30   GLUCOSE 102* 144* 106* 97 95  BUN 29* 34* 39* 44* 79*  CREATININE 3.60* 4.56* 5.68* 6.50* 10.21*  CALCIUM 7.4* 7.1* 7.3* -- --  MG -- -- -- -- --  PHOS 1.9* 2.6 2.9 3.5 6.2*    Liver Function Tests:  Lab 04/05/12 0503 04/04/12 0543 04/03/12 [id number] 04/02/12 0549 04/01/12 0445 03/31/12 0510 03/30/12 0450 03/29/12 2143  AST -- -- -- -- -- 24 26 33  ALT -- -- -- -- -- 13 18 23   ALKPHOS -- -- -- -- --  77 104 130*  BILITOT -- -- -- -- -- 0.8 0.8 1.0  PROT -- -- -- -- -- 6.5 8.1 9.7*  ALBUMIN 1.8* 1.9* 2.1* 2.2* 2.3* -- -- --    Lab 03/29/12 2143  LIPASE 143*  AMYLASE --   CBC:  Lab 04/05/12 0503 04/04/12 0543 04/03/12 0656 04/02/12 0549 04/01/12 0445 03/31/12 0510 03/29/12 2143  WBC 7.9 8.8 12.2* 12.2* 10.1 -- --  NEUTROABS -- -- -- -- -- 5.2 10.1*  HGB 8.2* 8.8* 9.2* 9.2* 9.6* -- --  HCT 24.4* 26.0* 27.5* 27.3* 28.3* -- --  MCV 73.1* 73.7* 73.9* 73.2* 72.4* -- --  PLT 126* 121* 123* 140* 160 -- --    CBG:  Lab 04/05/12 0741 04/04/12 2128 04/04/12 1705 04/04/12 1209 04/04/12 0758  GLUCAP 92 79 131* 104* 133*   Microbiology: Results for orders placed during the hospital  encounter of 03/29/12  MRSA PCR SCREENING     Status: Normal   Collection Time   03/30/12  3:18 AM      Component Value Range Status Comment   MRSA by PCR NEGATIVE  NEGATIVE Final   URINE CULTURE     Status: Normal   Collection Time   03/30/12  3:45 AM      Component Value Range Status Comment   Specimen Description URINE, CATHETERIZED   Final    Special Requests NONE   Final    Culture  Setup Time 03/30/2012 04:33   Final    Colony Count NO GROWTH   Final    Culture NO GROWTH   Final    Report Status 03/31/2012 FINAL   Final   CULTURE, BLOOD (ROUTINE X 2)     Status: Normal (Preliminary result)   Collection Time   04/02/12 12:34 PM      Component Value Range Status Comment   Specimen Description BLOOD LEFT ARM   Final    Special Requests BOTTLES DRAWN AEROBIC AND ANAEROBIC 10CC   Final    Culture  Setup Time 04/02/2012 16:59   Final    Culture     Final    Value:        BLOOD CULTURE RECEIVED NO GROWTH TO DATE CULTURE WILL BE HELD FOR 5 DAYS BEFORE ISSUING A FINAL NEGATIVE REPORT   Report Status PENDING   Incomplete   CULTURE, BLOOD (ROUTINE X 2)     Status: Normal (Preliminary result)   Collection Time   04/02/12 12:36 PM      Component Value Range Status Comment   Specimen Description BLOOD LEFT HAND   Final    Special Requests BOTTLES DRAWN AEROBIC AND ANAEROBIC 10CC   Final    Culture  Setup Time 04/02/2012 16:59   Final    Culture     Final    Value:        BLOOD CULTURE RECEIVED NO GROWTH TO DATE CULTURE WILL BE HELD FOR 5 DAYS BEFORE ISSUING A FINAL NEGATIVE REPORT   Report Status PENDING   Incomplete   URINE CULTURE     Status: Normal   Collection Time   04/02/12  1:15 PM      Component Value Range Status Comment   Specimen Description URINE, CLEAN CATCH   Final    Special Requests NONE   Final    Culture  Setup Time 04/03/2012 01:37   Final    Colony Count >=100,000 COLONIES/ML   Final    Culture ESCHERICHIA COLI   Final  Report Status 04/04/2012 FINAL   Final     Organism ID, Bacteria ESCHERICHIA COLI   Final       Medications:    Infusions:    . [DISCONTINUED] sodium chloride 50 mL/hr at 04/03/12 2020   Scheduled Medications:    . antiseptic oral rinse  15 mL Mouth Rinse BID  . atorvastatin  20 mg Oral QHS  . feeding supplement  1 Container Oral TID BM  . heparin  40 Units/kg Dialysis Once in dialysis  . heparin  5,000 Units Subcutaneous Q8H  . insulin aspart  0-9 Units Subcutaneous TID AC & HS  . insulin glargine  10 Units Subcutaneous QHS  . levofloxacin  250 mg Oral Q24H  . multivitamin  1 tablet Oral Daily  . sodium chloride  3 mL Intravenous Q12H  . [COMPLETED] white petrolatum      . [DISCONTINUED] ampicillin-sulbactam (UNASYN) IV  1.5 g Intravenous Q12H     PRN Medications: sodium chloride, sodium chloride, sodium chloride, sodium chloride, acetaminophen, feeding supplement (NEPRO CARB STEADY), feeding supplement (NEPRO CARB STEADY), heparin, heparin, lidocaine, lidocaine, lidocaine-prilocaine, lidocaine-prilocaine, loperamide, ondansetron (ZOFRAN) IV, ondansetron, pentafluoroprop-tetrafluoroeth, pentafluoroprop-tetrafluoroeth, traMADol    Assessment/ Plan:    Mr. Wik is a 61 yo gentleman with a history of type 2 diabetes on insulin, invasive adenocarcinoma of colon s/p right hemicolectomy complicated by postop intra-abdominal abscess with a leaking anastamosis, OSA, hypertension HD #7 being treated for acute renal failure.   Acute renal failure: Creatinine 3.6, BUN 29 as of 11/3 (baseline Cr 0.9-1)  Likely secondary to extreme volume depletion in the setting of nausea, vomiting, increased ileostomy output. On 02/05/12 he weighed 256 lbs today he weighs 212. Appears less volume depleted on exam. BMP values improving. Anion gap is 14. Metabolic acidosis likely due to renal failure. Renal US showed no hydronephrosis, only bilateral cysts.  -Dialysis x2 starting 10/29, stopped 10/31, Renal removed hemodialysis catheter  11/2 -Pt continues to make urine (1250 mL 11/2)  -IV fluids have been dc'd he is on saline lock  -Strict I and O's, daily weights  -Zofran PRN  -Follow BMP   Urinary Tract Infection: Pt's WBC count has decreased to 7.9.  -Urine culture grew E. coli -Blood cultures have shown no growth to date  -Pt is on day #2 of levofloxacin   Colon adenocarcinoma: S/p right hemicolectomy and ileostomy creation  Hx of abdominal abscess 2/2 leaking anastamosis. CT scan abd/pelvis with inflammation of omental fat, no focal abscess noted, no obstruction. Pt with abdominal pain around incision, no rebound or guarding. Ileostomy site appears clear, +output. Does not appear septic.  -Surgery saw the pt 11/3 saying his abdominal wound showed no signs of infection and is healing well -Continue wound care with daily wet to dry dressings  -Imodium to correct loose stools -follow CBC, vitals  -Acetaminophen and tramadol prn for analgesia   Anemia: Hgb - 8.2, Hct - 24.4, MCV 73.1, platelets 126. Potentially a result of HD catheter placement, HD, and aggressive fluid rehydration. Also could be a combination of chronic inflammation (he has had cancer and multiple surgeries in his recent past) and decreased iron intake (unclear on what his diet was like outside of the hospital but intake has been poor during this admission)  -relatively stable from yesterday -No intervention at this time   Proximal Muscle Weakness: Noted during physical exam 11/1 (3+/5 hip flexion, 4/5 deltoid strength) likely due to deconditioning pt has largely been immobile for an extended period of  time.  -PT is working with the patient   Increased Protein Gap:  Given the patient's age, gender, race, protein gap >4, weight loss, renal failure, and recent anemia, along with his initial finding of rouleaux formation multiple myeloma should be considered. He does have explanations for these symptoms (severe dehydration can cause rouleaux, anemia could  be due to recent start of hemodialysis and has been receiving aggressive rehydration, he is not hypercalcemic, he has had multiple surgeries and decreased activity level which could contribute to weight loss)  -Free Kappa increased at 72.6, Free Lambda increased to 20.6, ratio normal at 3.52, no evidence of a monoclonal spike  -Unlikely to be multiple myeloma given these results   Hyperkalemia: K 3.7 as of 11/2  Pt was hyperkalemic at admission with ECG changes that resolved with sodium polystyrene sulfonate. Since admission his K has decreased to wnl. Likely due to GI losses and NPO status iv fluids have not had K in them.  -Has since resolved   Decubitus Ulcer: Grade 2 located midline of superior aspect of gluteal cleft, appears improved from admission less erythematous  -Wound care recommends foam dressing  -encouraged regular turns in bed to offload  -Maintain nutrition with regular meals and supplementation   Diabetes:  -hold metformin as pt with metabolic acidosis and renal failure  -continue lantus 10 units daily with correction scale  -Check blood sugar every 4 hours   Hypotension:  Last episode on 10/31 (101/53). Pt's BP's have been normal since.  -NS running at 50 cc/hr  -continue PO intake   Hyperlipidemia:  -Continue home atorvastatin  -Goal of LDL <70   VTE Prophylaxis:  -heparin [place]   Diet:  -Continue renal diet but try to decrease amount of carbohydrates with meals  -Due to poor dentition encouraged pt to order foods that he knows he can chew  -Encourage pt to eat (the percent of meal eaten has been low)  -Pt fluid restricted to 1200 mL per day   Dispo:  -Pt is adamant about not going back to [place], states that his daughters will take him home and arrange for care.  I spoke to one daughter via phone today who seemed unaware of her father's discharge situation.  My resident spoke to another daughter, she is aware of her father's wishes and they plan to take care  of him upon discharge; she is currently enrolled at [facility] and will need a note excusing her if she needs to take time off to care for her father. -Social work is following the pt and is aware of the situation        Length of Stay: 7 days   This is a Careers information officer Note.  The care of the patient was discussed with Dr. Michail Sermon and the assessment and plan formulated with their assistance.  Please see their attached note or addendum for official documentation of the daily encounter.

## 2012-04-06 LAB — GLUCOSE, CAPILLARY
Glucose-Capillary: 79 mg/dL (ref 70–99)
Glucose-Capillary: 119 mg/dL — ABNORMAL HIGH (ref 70–99)
Glucose-Capillary: 98 mg/dL (ref 70–99)

## 2012-04-06 MED ORDER — LEVOFLOXACIN 250 MG PO TABS: 250.0000 mg | ORAL_TABLET | ORAL | Status: DC

## 2012-04-06 MED ORDER — TRAMADOL HCL 50 MG PO TABS: 50.0000 mg | ORAL_TABLET | Freq: Four times a day (QID) | ORAL | Status: DC | PRN

## 2012-04-06 NOTE — Discharge Summary (Signed)
[facility] Discharge Note  Name: DELORE MENNER MRN: [id number] DOB: [birth year] 61 y.o.  Date of Admission: 03/29/2012  6:16 PM Date of Discharge: 04/14/2012 Attending Physician: No att. providers found  Discharge Diagnosis: Principal Problem:  *Acute renal failure Active Problems:  Type II diabetes mellitus, well controlled  Hyperlipidemia LDL goal <70  TOBACCO ABUSE  H/O colonoscopy with polypectomy  Protein-calorie malnutrition, severe  Leukocytosis  Dehisced intestinal anastomosis  E. coli UTI (urinary tract infection)   Discharge Medications:   Medication List     As of 04/14/2012  6:43 AM    STOP taking these medications         insulin NPH-insulin regular (70-30) 100 UNIT/ML injection   Commonly known as: NOVOLIN 70/30      TAKE these medications         atorvastatin 20 MG tablet   Commonly known as: LIPITOR   Take 20 mg by mouth at bedtime.      loperamide 2 MG capsule   Commonly known as: IMODIUM   Take 1 capsule (2 mg total) by mouth 4 (four) times daily as needed for diarrhea or loose stools (for very loose ileostomy output; output should be thicker (pancake batter consistency) not watery).          Disposition and follow-up:   Mr.Astor A Kravchenko was discharged from [facility] in stable condition.  At the hospital follow up visit please address his kidney function- is he making urine, what are his Cr and GFR. Has he followed up with GenSurg for a follow up and wound check- is he receiving adequate wound care at home.  Follow-up Appointments: Follow-up Information    Follow up with Maia Petties., MD. (Friday Nov 8th at 9:45AM)    Contact information:   Surgery Post-operative follow-up [place] [id number] [phone]         Discharge Orders    Future Appointments: Provider: Department: Dept Phone: Center:   04/14/2012 10:15 AM [name], MD [place] [phone] [facility]   04/14/2012 1:30 PM Imogene Burn. [place], [facility], [place] [phone] None     Future Orders Please Complete By Expires   Diet Carb Modified      Increase activity slowly      Comments:   Do not lift objects heavier than 10-15lbs until cleared by General Surgery.   Discharge wound care:      Comments:   Apply wet-to-dry dressing changes at least once daily and as needed. Home Health nursing and social work from [facility] will be coming to assist you.      Consultations: Treatment Team:  Sol Blazing, MD Imogene Burn. [name], MD Joyice Faster Deterding, MD  Procedures Performed:  Ct Abdomen Pelvis W Contrast  03/29/2012  *RADIOLOGY REPORT*  Clinical Data: Status post fall.  History of intra-abdominal abscess secondary to the anastomotic leak.  History of colon cancer.  CT ABDOMEN AND PELVIS WITH CONTRAST  Technique:  Multidetector CT imaging of the abdomen and pelvis was performed following the standard protocol during bolus administration of intravenous contrast.  Contrast: 131mL OMNIPAQUE IOHEXOL 300 MG/ML  SOLN  Comparison: Chest and two views abdomen 02/16/2012.  Findings: The lung bases are clear.  There is no pleural or pericardial effusion.  The patient has a midline surgical wound.  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  The patient is status post right hemicolectomy.  There is no abscess.  There is extensive infiltration of omental fat, particularly on the left.  There is no rim enhancing fluid collection although there are some small foci of fluid density material present.  No evidence of small bowel obstruction is present.  The stomach appears normal.  A few small stones are seen layering dependently in the gallbladder but there is no evidence of cholecystitis.  The liver, adrenal glands, spleen and pancreas appear normal.  Low attenuating lesions in both kidneys are most consistent with cysts. There are lymph nodes in  the porta hepatis measuring up to 1.8 cm on image 21. Foley catheter is in place urinary bladder with an air-fluid level identified.  No focal bony abnormality is seen with extensive degenerative disease present about the hips.  IMPRESSION:  1.  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 2.  Extensive infiltration of omental fat, worse on the left, could be due to infectious or inflammatory process or postoperative change.  Omental tumor implants are possible but felt somewhat less likely.  No abscess is identified. 3.  Fatty infiltration of the liver. 4.  Small lymph node in the porta hepatis is nonspecific and likely reactive.  Recommend attention on follow-up exams. 5.  Small gallstones without evidence of cholecystitis.   Original Report Authenticated By: Arvid Right. Luther Parody, M.D.    US Renal  03/30/2012  *RADIOLOGY REPORT*  Clinical Data: Acute renal failure.  RENAL/URINARY TRACT ULTRASOUND COMPLETE  Comparison:  CT scan dated 05/29/2012  Findings:  Right Kidney:  13.4 cm in length.  Several small cysts in the right kidney.  No hydronephrosis. Slightly echogenic renal parenchyma, equal to the liver.  Left Kidney:  12.6 cm in length.  3.3 cm oblong cyst in the upper pole.  No hydronephrosis.  Bladder:  Almost empty.  IMPRESSION:  1.  No hydronephrosis.  Slight increased echogenicity of the renal parenchyma consistent with renal medical disease. 2.  Small benign-appearing cysts on both kidneys.   Original Report Authenticated By: Larey Seat, M.D.     Admission:  History of Present Illness:  Mr. Manjarres is a 61 yo gentleman with a history of type 2 diabetes on insulin, invasive adenocarcinoma of colon s/p right hemicolectomy complicated by postop intra-abdominal abscess with a leaking anastamosis, OSA, hypertension, currently in a SNF, who presents with nausea, vomiting, decreased urination, and weight loss. He states that over the past 3-4 weeks,  he has had decreased PO intake and weakness. He has been unable to keep most fluids or solid foods down over the past 1-2 weeks. He has vomited approximately 2-3 times per day during this time with increased ileostomy output. He has not urinated very much for the past 2-3 weeks, but almost none over the past several days. He states his ileostomy bag is changed 1-3 times per day. His vomit usually smells like stool. He denies fever but he has had some chills, c/o thirst, and has had some incisional pain in his abdomen but nothing new or severe.  Denies chest pain, shortness of breath, increased swelling in his legs. Physical Exam  Blood pressure 123/83, pulse 88, temperature 97.6 F (36.4 C), temperature source Oral, resp. rate 18, SpO2 98.00%. on RA  General: No acute distress, alert and oriented x 3  HEENT: PERRL, EOMI, dry mucous membranes, cracked dry lips  Cardiovascular: Regular rate and rhythm, [id number] systolic murmur  Respiratory: Clear to auscultation bilaterally, no wheezes, rales, or rhonchi  Abdomen: Soft, no rebound or guarding, right lower quadrant ostomy in place, bag with green-yellow clear liquid, bandage over midline wound (recently changed and examined by MD so did not remove)  Extremities: Warm and well-perfused, trace LE edema  Skin: Warm, dry, no rashes  Neuro: alert and oriented   Hospital Course:  1. Acute Kidney Injury: Creatinine 18 on admission, and pt reported no uop in the 4 days prior to admission. He also had increased liquid ostomy output and emesis over the week prior to admission. Unfortunately a CT abd/pelvis with contrast was obtained in the ED, which likely worsened his renal failure. Renal was consulted and pt aggressively hydrated with bicarb in sterile water per Nephrology, which was then changed to normal saline. A temporary femoral hemodialysis catheter was placed and the pt received HD x2. Creatinine began improving, trending towards baseline, with appropriate  urine output and good oral intake. Renal determined that the pt no longer neeed HD, and the Fem cath was removed by Renal prior to d/c.  Lab  04/05/12 0503  04/04/12 0543  04/03/12 0656  04/02/12 0549  04/01/12 0445   CREATININE  3.60*  4.56*  5.68*  6.50*  10.21*    2. Colon adenocarcinoma s/p right hemicolectomy: Initial surgery 02/06/12, and unfortunately he developed an anastomotic leak with intraabdominal abscess formation post-op with return to OR for multiple washouts, and subsequent abdominal wound closure on 02/26/12. S/p prior wound vac in place which was d/c on 03/10/12. The lower half of the wound healed, but the upper half is still healing and is open superficially with visible fascial sutures. Surgery re-evaluated pt and noted no evidence of infection with drainage likely secondary to fibrinous exudate. Pt placed on Vancomycin and Unasyn on 10/31 when pt became febrile and continued to have fevers in setting of negative blood cultures. Urine cultures returned positive for E.coli with >100K. Started Levafloxacin and the pt has been afebrile since.  - Continue Levafloxacin, day 3/10  Lab  04/05/12 0503  04/04/12 0543  04/03/12 0656  04/02/12 0549  04/01/12 0445   WBC  7.9  8.8  12.2*  12.2*  10.1    3. E.coli Urinary Tract Infection: Likely source of prior SIRS developed on 10/31, good UOP w/o foley placed, started renally dosed Levofloxacin for 14 day course on 11/3, b/c which has better gram negative coverage than Unasyn  -Continue Levafloxacin 250 mg oral x 10 days, day 3/10  Urinalysis    Component  Value  Date/Time    COLORURINE  YELLOW  04/02/2012 1315    APPEARANCEUR  TURBID*  04/02/2012 1315    LABSPEC  1.016  04/02/2012 1315    PHURINE  6.0  04/02/2012 1315    GLUCOSEU  100*  04/02/2012 1315    HGBUR  LARGE*  04/02/2012 [place]  04/02/2012 [place]  04/02/2012 1315    PROTEINUR  100*  04/02/2012 1315    UROBILINOGEN  0.2  04/02/2012  1315    NITRITE  NEGATIVE  04/02/2012 1315    LEUKOCYTESUR  LARGE*  04/02/2012 1315    4. Hyperkalemia: Potassium 6.5 on admission, but pt very dehydrated. After IVF started, am BMP with K+ 4.5 --> 4.7 --> 3.4 -->3.7-->3.7-->4.1-->3.7-->3.5.  Lab  04/05/12 0503  04/04/12 0543  04/03/12 0656  04/02/12 0549  04/01/12 0445   K  3.5  3.7  4.1  3.7  3.7    5. Stage  2 decubitus ulcer: Present on admission. In setting of malnutrition related to suboptimal oral intake. Wound Care was consulted and determined no further intervention needed. We continue silicone foam dressings per Wound Care recs. Nutrition was consulted and recommended [place] in addition to a renal diet, which was discussed with the pt.   6. DVT PPx: [place] Heparin   7. Disposition: Pt refuses return to SNF, states that he will have 24h care via his 3 daughters, the team spoke with daughter Joseph Art, who assured them that she and her sister Brayton Layman would be caring for pt. Unfortunately the pt is uninsured thus likely will not be able to procure [place]- discussed with Case Management. Appreciate SW and Case Management recs  - D/c home today  - Daughter Renee requesting letter for her College files stating that she has to provide care for father  - F/u in [facility] in 1 week       Discharge Vitals:  BP 121/74  Pulse 85  Temp 98.1 F (36.7 C) (Oral)  Resp 18  Ht 6' (1.829 m)  Wt 207 lb 3.7 oz (94 kg)  BMI 28.11 kg/m2  SpO2 100%  Discharge Labs:  No results found for this or any previous visit (from the past 24 hour(s)).  Signed: Otho Bellows 04/14/2012, 6:43 AM   Time Spent on Discharge: 29min Services Ordered on Discharge: [place], Nursing, Nursing Aid, and Social Work, and wound/ostomy care Equipment Ordered on Discharge: None

## 2012-04-06 NOTE — Progress Notes (Signed)
Pt. discharge to floor,verbalized understanding of discharged instruction,medication,restriction,diet and follow up appointment.Baseline Vitals sign stable,Pt comfortable,no sign and symptom of distress. 

## 2012-04-06 NOTE — Progress Notes (Signed)
Resident Co-sign Daily Note: I have seen the patient and reviewed the daily progress note by Elmer Sow, MS- IV and discussed the care of the patient with him.  See  My note from 04/03/12 for documentation of my findings, assessment, and plans.   Otho Bellows 04/06/2012, 10:45 AM

## 2012-04-06 NOTE — Progress Notes (Signed)
Resident Addendum to Medical Student Note   I have seen and examined the patient, and agree with the the medical student assessment and plan outlined above. Please see my brief note below for additional details.  S: Pt remained afebrile overnight. On day #3 of Levafloxacin for UTI. Pt states that he is ready to go home.  OBJECTIVE: VS: Reviewed  Meds: Reviewed  Labs: Reviewed  Imaging: Reviewed   Physical Exam: General: Comfortably lying in bed watching TV, NAD  Pulm: CTAB Abd: Soft, NT, ND, bandages c/d/i, ostomy with liquid stool Ext: No edema  Neuro: Alert and oriented X3, CN II-XII intact  ASSESSMENT/ PLAN:  1. Acute Kidney Injury: s/p HD x2, creatinine continues to improve, trending towards baseline with appropriate urine output and good po intake. Fem cath removed by Renal, as the pt no longer requires HD. - Continue to encourage oral intake  Lab 04/05/12 0503 04/04/12 0543 04/03/12 0656 04/02/12 0549 04/01/12 0445  CREATININE 3.60* 4.56* 5.68* 6.50* 10.21*    2. Colon adenocarcinoma s/p right hemicolectomy: Initial surgery 02/06/12, and unfortunately he developed an anastomotic leak with intraabdominal abscess formation post-op with return to OR for multiple washouts, and subsequent abdominal wound closure on 02/26/12. S/p prior wound vac in place which was d/c on 03/10/12. The lower half of the wound healed, but the upper half is still healing and is open superficially with visible fascial sutures. Surgery re-evaluated pt and noted no evidence of infection with drainage likely secondary to fibrinous exudate. Pt placed on Vancomycin and Unasyn on 10/31 when pt became febrile and continued to have fevers in setting of negative blood cultures.  Urine cultures returned positive for E.coli with >100K. Started Levafloxacin andpt afebrile since. Appreciate General Surgery for wound care/recommendations  - Continue Levafloxacin, day 3/10   Lab 04/05/12 0503 04/04/12 0543 04/03/12 0656  04/02/12 0549 04/01/12 0445  WBC 7.9 8.8 12.2* 12.2* 10.1    3. E.coli Urinary Tract Infection: Likely source of prior SIRS developed on 10/31, good UOP w/o foley placed, started renally dosed Levofloxacin for 10 day course on 11/3, b/c which has better gram negative coverage than Unasyn -Continue Levafloxacin 250 mg oral x 10 days, day 3/10  Urinalysis    Component Value Date/Time   COLORURINE YELLOW 04/02/2012 1315   APPEARANCEUR TURBID* 04/02/2012 1315   LABSPEC 1.016 04/02/2012 1315   PHURINE 6.0 04/02/2012 1315   GLUCOSEU 100* 04/02/2012 1315   HGBUR LARGE* 04/02/2012 [place] 04/02/2012 [place] 04/02/2012 1315   PROTEINUR 100* 04/02/2012 1315   UROBILINOGEN 0.2 04/02/2012 1315   NITRITE NEGATIVE 04/02/2012 1315   LEUKOCYTESUR LARGE* 04/02/2012 1315     4. Hyperkalemia: Resolved  Lab 04/05/12 0503 04/04/12 0543 04/03/12 0656 04/02/12 0549 04/01/12 0445  K 3.5 3.7 4.1 3.7 3.7    5. Stage 2 decubitus ulcer: Present on admission. In setting of malnutrition related to suboptimal oral intake. No further intervention needed per WC.  -Continue silicone foam dressings per Wound Care recs -Continue dietician recs including [facility]  6. DVT PPx: [place] Heparin  7. Disposition: Pt refuses return to SNF, states that he will have 24h care via his 3 daughters, the team spoke with daughter Joseph Art, who assured them that she and her sister Brayton Layman would be caring for pt. Unfortunately the pt is uninsured thus likely will not be able to procure [place]- discussing with Case Management. Appreciate SW and Case Management recs - D/c home today -  Daughter Renee requesting letter for her College files stating that she has to provide care for father - F/u in [facility] in 1 week  Length of Stay: 8   Otho Bellows, MD PGY1, Internal Medicine Resident 04/06/2012, 8:40 AM

## 2012-04-06 NOTE — Progress Notes (Signed)
Clinical Social Work-CSW reviewed case with treatment team-pt remained adamant re: return home rather than back to SNF- treatment team aware of pt choice-No further CSW needs at this time- Gerre Scull, [phone]

## 2012-04-06 NOTE — Progress Notes (Signed)
Internal Medicine Attending  Date: 04/06/2012  Patient name: [name] Medical record number: [id number] Date of birth: [birth year] Age: 61 y.o. Gender: male  I saw and evaluated the patient,and discussed his care on a.m. rounds with house staff . I reviewed the resident's note by Dr. Eulas Post and I agree with the resident's findings and plans as documented in her note.

## 2012-04-06 NOTE — Progress Notes (Signed)
Patient ID: [name], male   DOB: [birth year], 61 y.o.   MRN: [id number] Medical Student Daily Progress Note   Subjective:    Interval Events:   No acute events overnight.  Pt has been afebrile for >24 hours.  [name] states today that he feels good and is ready to go home.  Reports his daughter is coming to get him today and will be here around 1130.  Only complaint today is some sinus congestion which he is attributing to dry air.  Pt endorses some abdominal pain at incision site yesterday that resolved with acetaminophen and tramadol.  Pt denies HA, vision changes, fever, chills, SOB, chest pain, dysuria, and weakness/numbness/tingling in extremities.    Objective:    Vital Signs:   Temp:  [98 F (36.7 C)-98.8 F (37.1 C)] 98 F (36.7 C) (11/04 0518) Pulse Rate:  [69-79] 69  (11/04 0518) Resp:  [18-20] 20  (11/04 0518) BP: (115-130)/(66-73) 130/72 mmHg (11/04 0518) SpO2:  [99 %-100 %] 100 % (11/04 0518) Weight:  [94 kg (207 lb 3.7 oz)] 94 kg (207 lb 3.7 oz) (11/04 0518) Last BM Date: 04/05/12   Weights: 24-hour Weight change: -2.4 kg (-5 lb 4.7 oz)  Filed Weights   04/04/12 0622 04/05/12 0515 04/06/12 0518  Weight: 96.7 kg (213 lb 3 oz) 96.4 kg (212 lb 8.4 oz) 94 kg (207 lb 3.7 oz)   Net since admission:  4.6 kg   Intake/Output:   Intake/Output Summary (Last 24 hours) at 04/06/12 0826 Last data filed at 04/06/12 0522  Gross per 24 hour  Intake    960 ml  Output   1400 ml  Net   -440 ml     Net since admission: 611.17 Urine output 11/3 - 740  Physical Exam: GENERAL:  alert and oriented; sitting comfortably on side of bed and in no distress EYES:  pupils equal, round, and reactive to light; sclera anicteric HENT:  NCAT, moist mucosa, poor dentition (no upper dentition, large amount of decay on bottom teeth) NECK:  no JVD, no lymphadenopathy palpated LUNGS:  clear to auscultation bilaterally, normal work of breathing HEART:  normal rate; regular rhythm;  normal S1 and S2, no S3 or S4 appreciated; no murmurs, rubs, or clicks appreciated ABDOMEN:  soft, non-tender, normal bowel sounds, no masses palpated, 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 with liquid stool present, superficially open midline incision partially covered in bandage, uncovering revealed a clean wound with good granulation tissue non-tender to palpation and unable to express fluid from wound NEURO: EOMI, tongue protrudes to midline, palate rises symmetrically, sensation to light touch intact in face and extremities, 2+ reflexes bilaterally (biceps, patellar), 5/5 biceps, 5/5 triceps, 5/5 knee flexion and extension, 5/5 dorsiflexion and plantar flexion, 5/5 deltoid strength, normal grip strength, good eye closure, forehead rises symmetrically EXTREMITIES:  No peripheral edema, 2+ radial and dorsalis pedis pulses SKIN:  normal turgor, dry skin on upper and lower extremities    Labs: Basic Metabolic Panel:  Lab [id number] 04/04/12 0543 04/03/12 0656 04/02/12 0549 04/01/12 0445  NA 134* 137 135 135 133*  K 3.5 3.7 4.1 3.7 3.7  CL 103 103 99 99 91*  CO2 23 26 27 27 30   GLUCOSE 102* 144* 106* 97 95  BUN 29* 34* 39* 44* 79*  CREATININE 3.60* 4.56* 5.68* 6.50* 10.21*  CALCIUM 7.4* 7.1* 7.3* -- --  MG -- -- -- -- --  PHOS 1.9* 2.6 2.9 3.5 6.2*    Liver Function Tests:  Lab 04/05/12 0503 04/04/12 0543 04/03/12 0656 04/02/12 0549 04/01/12 0445 03/31/12 0510  AST -- -- -- -- -- 24  ALT -- -- -- -- -- 13  ALKPHOS -- -- -- -- -- 77  BILITOT -- -- -- -- -- 0.8  PROT -- -- -- -- -- 6.5  ALBUMIN 1.8* 1.9* 2.1* 2.2* 2.3* --   CBC:  Lab 04/05/12 0503 04/04/12 0543 04/03/12 0656 04/02/12 0549 04/01/12 0445 03/31/12 0510  WBC 7.9 8.8 12.2* 12.2* 10.1 --  NEUTROABS -- -- -- -- -- 5.2  HGB 8.2* 8.8* 9.2* 9.2* 9.6* --  HCT 24.4* 26.0* 27.5* 27.3* 28.3* --  MCV 73.1* 73.7* 73.9* 73.2* 72.4* --  PLT 126* 121* 123* 140* 160 --    CBG:  Lab 04/05/12 2102 04/05/12 1730 04/05/12 1210 04/05/12 0741  04/04/12 2128  GLUCAP 97 94 152* 92 89   Microbiology: Results for orders placed during the hospital encounter of 03/29/12  MRSA PCR SCREENING     Status: Normal   Collection Time   03/30/12  3:18 AM      Component Value Range Status Comment   MRSA by PCR NEGATIVE  NEGATIVE Final   URINE CULTURE     Status: Normal   Collection Time   03/30/12  3:45 AM      Component Value Range Status Comment   Specimen Description URINE, CATHETERIZED   Final    Special Requests NONE   Final    Culture  Setup Time 03/30/2012 04:33   Final    Colony Count NO GROWTH   Final    Culture NO GROWTH   Final    Report Status 03/31/2012 FINAL   Final   CULTURE, BLOOD (ROUTINE X 2)     Status: Normal (Preliminary result)   Collection Time   04/02/12 12:34 PM      Component Value Range Status Comment   Specimen Description BLOOD LEFT ARM   Final    Special Requests BOTTLES DRAWN AEROBIC AND ANAEROBIC 10CC   Final    Culture  Setup Time 04/02/2012 16:59   Final    Culture     Final    Value:        BLOOD CULTURE RECEIVED NO GROWTH TO DATE CULTURE WILL BE HELD FOR 5 DAYS BEFORE ISSUING A FINAL NEGATIVE REPORT   Report Status PENDING   Incomplete   CULTURE, BLOOD (ROUTINE X 2)     Status: Normal (Preliminary result)   Collection Time   04/02/12 12:36 PM      Component Value Range Status Comment   Specimen Description BLOOD LEFT HAND   Final    Special Requests BOTTLES DRAWN AEROBIC AND ANAEROBIC 10CC   Final    Culture  Setup Time 04/02/2012 16:59   Final    Culture     Final    Value:        BLOOD CULTURE RECEIVED NO GROWTH TO DATE CULTURE WILL BE HELD FOR 5 DAYS BEFORE ISSUING A FINAL NEGATIVE REPORT   Report Status PENDING   Incomplete   URINE CULTURE     Status: Normal   Collection Time   04/02/12  1:15 PM      Component Value Range Status Comment   Specimen Description URINE, CLEAN CATCH   Final    Special Requests NONE   Final    Culture  Setup Time 04/03/2012 01:37   Final    Colony Count  >=100,000 COLONIES/ML   Final  Culture ESCHERICHIA COLI   Final    Report Status 04/04/2012 FINAL   Final    Organism ID, Bacteria ESCHERICHIA COLI   Final       Medications:    Scheduled Medications:    . antiseptic oral rinse  15 mL Mouth Rinse BID  . atorvastatin  20 mg Oral QHS  . feeding supplement  1 Container Oral TID BM  . [EXPIRED] glucose-Vitamin C      . heparin  40 Units/kg Dialysis Once in dialysis  . heparin  5,000 Units Subcutaneous Q8H  . insulin aspart  0-9 Units Subcutaneous TID AC & HS  . insulin glargine  10 Units Subcutaneous QHS  . levofloxacin  250 mg Oral Q24H  . multivitamin  1 tablet Oral Daily  . sodium chloride  3 mL Intravenous Q12H  . 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   Acute renal failure: Creatinine 3.6, BUN 29 as of 11/3 (baseline Cr 0.9-1)  Likely secondary to extreme volume depletion in the setting of nausea, vomiting, increased ileostomy output. On 02/05/12 he weighed 256 lbs today he weighs 207. Appears less volume depleted on exam. BMP values improving. Anion gap is 14. Metabolic acidosis likely due to renal failure. Renal US showed no hydronephrosis, only bilateral cysts.  -Dialysis x2 starting 10/29, stopped 10/31, Renal removed hemodialysis catheter 11/2 -Pt  continues to make urine (740 mL 11/3)  -IV fluids have been dc'd he is on saline lock  -Strict I and O's, daily weights  -Zofran PRN  -Follow BMP   Urinary Tract Infection: Pt's WBC count has decreased to 7.9.  -Urine culture grew E. coli -Blood cultures have shown no growth to date  -Pt is on day #3 of levofloxacin (this finishes the abx course for uncomplicated UTI)   Colon adenocarcinoma: S/p right hemicolectomy and ileostomy creation  Hx of abdominal abscess 2/2 leaking anastamosis. CT scan abd/pelvis with inflammation of omental fat, no focal abscess noted, no obstruction. Pt with abdominal pain around incision, no rebound or guarding. Ileostomy site appears clear, +output. Does not appear septic.  -Surgery saw the pt 11/3 saying his abdominal wound showed no signs of infection and is healing well -Continue wound care with daily wet to dry dressings  -Imodium to correct loose stools -follow CBC, vitals  -Acetaminophen and tramadol prn for analgesia   Anemia: Hgb - 8.2, Hct - 24.4, MCV 73.1, platelets 126. Potentially a result of HD catheter placement, HD, and aggressive fluid rehydration. Also could be a combination of chronic inflammation (he has had cancer and multiple surgeries in his recent past) and decreased iron intake.  Kidney failure could have led to a significant decrease in erythropoietin production. -relatively stable from yesterday -Continue to monitor CBC -No intervention at this time   Proximal Muscle Weakness: Noted during physical exam 11/1 (3+/5 hip flexion, 4/5 deltoid strength) likely due to deconditioning pt has largely been immobile for an extended period of time. Also could be  from poor effort physical exam was normal 11/4 -PT is working with the patient   Increased Protein Gap:  Given the patient's age, gender, race, protein gap >4, weight loss, renal failure, and recent anemia, along with his initial finding of rouleaux formation multiple myeloma should be  considered. He does have explanations for these symptoms (severe dehydration can cause rouleaux, anemia could be due to recent start of hemodialysis and has been receiving aggressive rehydration, he is not hypercalcemic, he has had multiple surgeries and decreased activity level which could contribute to weight loss)  -Free Kappa increased at 72.6, Free Lambda increased to 20.6, ratio normal at 3.52, no evidence of a monoclonal spike  -Unlikely to be multiple myeloma given these results   Hyperkalemia: K 3.7 as of 11/2  Pt was hyperkalemic at admission with ECG changes that resolved with sodium polystyrene sulfonate. Since admission his K has decreased to wnl. Likely due to GI losses and NPO status iv fluids have not had K in them.  -Has since resolved   Decubitus Ulcer: Grade 2 located midline of superior aspect of gluteal cleft, appears improved from admission less erythematous  -foam dressing in place -encouraged regular turns in bed to offload  -Maintain nutrition with regular meals and supplementation   Diabetes:  -hold metformin as pt with metabolic acidosis and renal failure  -continue lantus 10 units daily with correction scale  -Check blood sugar every 4 hours   Hypotension:  Last episode on 10/31 (101/53). Pt's BP's have been normal since.  -continue PO intake   Hyperlipidemia:  -Continue home atorvastatin  -Goal of LDL <70   VTE Prophylaxis:  -heparin Medon daily  Diet:  -Continue renal diet  Dispo:  -Pt is adamant about not going back to [place], states that his daughters will take him home and arrange for care.   -After speaking to two of his daughters it is understood that he will go home with them and be taken care of at home, one daughter is currently enrolled at [facility] and will need a note excusing her if she needs to take time off to care for her father. -Pt was made aware that due to his lack of insurance he will be unable to receive any home health  services -Social work is following the pt and is aware of the situation  -Plan to discharge home today      Length of Stay: 8 days   This is a Careers information officer Note.  The care of the patient was discussed with Dr. Eulas Post and the assessment and plan formulated with their assistance.  Please see their attached note or addendum for official documentation of the daily encounter.

## 2012-04-08 ENCOUNTER — Encounter (INDEPENDENT_AMBULATORY_CARE_PROVIDER_SITE_OTHER): Payer: PRIVATE HEALTH INSURANCE | Admitting: Surgery

## 2012-04-08 LAB — CULTURE, BLOOD (ROUTINE X 2)
Culture: NO GROWTH
Culture: NO GROWTH

## 2012-04-10 ENCOUNTER — Encounter (INDEPENDENT_AMBULATORY_CARE_PROVIDER_SITE_OTHER): Payer: Self-pay | Admitting: Surgery

## 2012-04-13 ENCOUNTER — Encounter (HOSPITAL_COMMUNITY): Payer: Self-pay | Admitting: Emergency Medicine

## 2012-04-13 ENCOUNTER — Emergency Department (HOSPITAL_COMMUNITY): Payer: Medicaid Other

## 2012-04-13 ENCOUNTER — Telehealth: Payer: Self-pay | Admitting: *Deleted

## 2012-04-13 ENCOUNTER — Inpatient Hospital Stay (HOSPITAL_COMMUNITY)
Admission: EM | Admit: 2012-04-13 | Discharge: 2012-04-22 | DRG: 683 | Disposition: A | Discharge status: Home / Self Care | Payer: Medicaid Other | Attending: Internal Medicine | Admitting: Internal Medicine

## 2012-04-13 ENCOUNTER — Inpatient Hospital Stay (HOSPITAL_COMMUNITY): Payer: Medicaid Other

## 2012-04-13 DIAGNOSIS — Z794 Long term (current) use of insulin: Secondary | ICD-10-CM

## 2012-04-13 DIAGNOSIS — K912 Postsurgical malabsorption, not elsewhere classified: Secondary | ICD-10-CM | POA: Diagnosis present

## 2012-04-13 DIAGNOSIS — B9620 Unspecified Escherichia coli [E. coli] as the cause of diseases classified elsewhere: Secondary | ICD-10-CM

## 2012-04-13 DIAGNOSIS — Z932 Ileostomy status: Secondary | ICD-10-CM

## 2012-04-13 DIAGNOSIS — N179 Acute kidney failure, unspecified: Principal | ICD-10-CM | POA: Diagnosis present

## 2012-04-13 DIAGNOSIS — E872 Acidosis, unspecified: Secondary | ICD-10-CM | POA: Diagnosis present

## 2012-04-13 DIAGNOSIS — R1110 Vomiting, unspecified: Secondary | ICD-10-CM

## 2012-04-13 DIAGNOSIS — E860 Dehydration: Secondary | ICD-10-CM | POA: Diagnosis present

## 2012-04-13 DIAGNOSIS — Z79899 Other long term (current) drug therapy: Secondary | ICD-10-CM

## 2012-04-13 DIAGNOSIS — E43 Unspecified severe protein-calorie malnutrition: Secondary | ICD-10-CM

## 2012-04-13 DIAGNOSIS — E119 Type 2 diabetes mellitus without complications: Secondary | ICD-10-CM | POA: Diagnosis present

## 2012-04-13 DIAGNOSIS — T8132XA Disruption of internal operation (surgical) wound, not elsewhere classified, initial encounter: Secondary | ICD-10-CM

## 2012-04-13 DIAGNOSIS — E875 Hyperkalemia: Secondary | ICD-10-CM | POA: Diagnosis present

## 2012-04-13 DIAGNOSIS — D72829 Elevated white blood cell count, unspecified: Secondary | ICD-10-CM | POA: Diagnosis present

## 2012-04-13 DIAGNOSIS — R197 Diarrhea, unspecified: Secondary | ICD-10-CM

## 2012-04-13 DIAGNOSIS — I10 Essential (primary) hypertension: Secondary | ICD-10-CM | POA: Diagnosis present

## 2012-04-13 DIAGNOSIS — R112 Nausea with vomiting, unspecified: Secondary | ICD-10-CM

## 2012-04-13 DIAGNOSIS — N390 Urinary tract infection, site not specified: Secondary | ICD-10-CM | POA: Diagnosis present

## 2012-04-13 DIAGNOSIS — E871 Hypo-osmolality and hyponatremia: Secondary | ICD-10-CM | POA: Diagnosis present

## 2012-04-13 DIAGNOSIS — D649 Anemia, unspecified: Secondary | ICD-10-CM | POA: Diagnosis present

## 2012-04-13 DIAGNOSIS — C189 Malignant neoplasm of colon, unspecified: Secondary | ICD-10-CM

## 2012-04-13 DIAGNOSIS — Z85038 Personal history of other malignant neoplasm of large intestine: Secondary | ICD-10-CM

## 2012-04-13 DIAGNOSIS — F17200 Nicotine dependence, unspecified, uncomplicated: Secondary | ICD-10-CM | POA: Diagnosis present

## 2012-04-13 DIAGNOSIS — I959 Hypotension, unspecified: Secondary | ICD-10-CM | POA: Diagnosis present

## 2012-04-13 DIAGNOSIS — E869 Volume depletion, unspecified: Secondary | ICD-10-CM | POA: Diagnosis present

## 2012-04-13 LAB — URINALYSIS, MICROSCOPIC ONLY
Glucose, UA: NEGATIVE mg/dL
Hgb urine dipstick: NEGATIVE
Ketones, ur: 15 mg/dL — AB
Nitrite: NEGATIVE
Protein, ur: >300 mg/dL — AB
Specific Gravity, Urine: 1.029 (ref 1.005–1.030)
Urobilinogen, UA: 1 mg/dL (ref 0.0–1.0)
pH: 5 (ref 5.0–8.0)

## 2012-04-13 LAB — CBC WITH DIFFERENTIAL/PLATELET
Basophils Absolute: 0 10*3/uL (ref 0.0–0.1)
Basophils Relative: 0 % (ref 0–1)
Eosinophils Absolute: 0.1 10*3/uL (ref 0.0–0.7)
Eosinophils Relative: 1 % (ref 0–5)
HCT: 32 % — ABNORMAL LOW (ref 39.0–52.0)
Hemoglobin: 11.1 g/dL — ABNORMAL LOW (ref 13.0–17.0)
Lymphocytes Relative: 37 % (ref 12–46)
Lymphs Abs: 4.8 10*3/uL — ABNORMAL HIGH (ref 0.7–4.0)
MCH: 24.7 pg — ABNORMAL LOW (ref 26.0–34.0)
MCHC: 34.7 g/dL (ref 30.0–36.0)
MCV: 71.1 fL — ABNORMAL LOW (ref 78.0–100.0)
Monocytes Absolute: 0.7 10*3/uL (ref 0.1–1.0)
Monocytes Relative: 6 % (ref 3–12)
Neutro Abs: 7.3 10*3/uL (ref 1.7–7.7)
Neutrophils Relative %: 57 % (ref 43–77)
Platelets: 562 10*3/uL — ABNORMAL HIGH (ref 150–400)
RBC: 4.5 MIL/uL (ref 4.22–5.81)
RDW: 19 % — ABNORMAL HIGH (ref 11.5–15.5)
WBC: 13 10*3/uL — ABNORMAL HIGH (ref 4.0–10.5)

## 2012-04-13 LAB — COMPREHENSIVE METABOLIC PANEL
ALT: 31 U/L (ref 0–53)
AST: 36 U/L (ref 0–37)
Albumin: 2.9 g/dL — ABNORMAL LOW (ref 3.5–5.2)
Alkaline Phosphatase: 192 U/L — ABNORMAL HIGH (ref 39–117)
BUN: 84 mg/dL — ABNORMAL HIGH (ref 6–23)
CO2: 19 mEq/L (ref 19–32)
Calcium: 9.5 mg/dL (ref 8.4–10.5)
Chloride: 92 mEq/L — ABNORMAL LOW (ref 96–112)
Creatinine, Ser: 11.24 mg/dL — ABNORMAL HIGH (ref 0.50–1.35)
GFR calc Af Amer: 5 mL/min — ABNORMAL LOW (ref >90)
GFR calc non Af Amer: 4 mL/min — ABNORMAL LOW (ref >90)
Glucose, Bld: 84 mg/dL (ref 70–99)
Potassium: 5.4 mEq/L — ABNORMAL HIGH (ref 3.5–5.1)
Sodium: 129 mEq/L — ABNORMAL LOW (ref 135–145)
Total Bilirubin: 0.8 mg/dL (ref 0.3–1.2)
Total Protein: 8.3 g/dL (ref 6.0–8.3)

## 2012-04-13 LAB — CLOSTRIDIUM DIFFICILE BY PCR: Toxigenic C. Difficile by PCR: NEGATIVE

## 2012-04-13 LAB — TROPONIN I: Troponin I: <0.3 ng/mL (ref <0.30)

## 2012-04-13 LAB — LIPASE, BLOOD: Lipase: 55 U/L (ref 11–59)

## 2012-04-13 LAB — GLUCOSE, CAPILLARY: Glucose-Capillary: 141 mg/dL — ABNORMAL HIGH (ref 70–99)

## 2012-04-13 LAB — PROTIME-INR
INR: 1.16 (ref 0.00–1.49)
Prothrombin Time: 14.6 seconds (ref 11.6–15.2)

## 2012-04-13 IMAGING — CR DG CHEST 2V
2 series · 2 of 2 positions shown · non-contrast
Comparison: [DATE]

CLINICAL DATA: Chest pain

CHEST - 2 VIEW

[w chest pa]
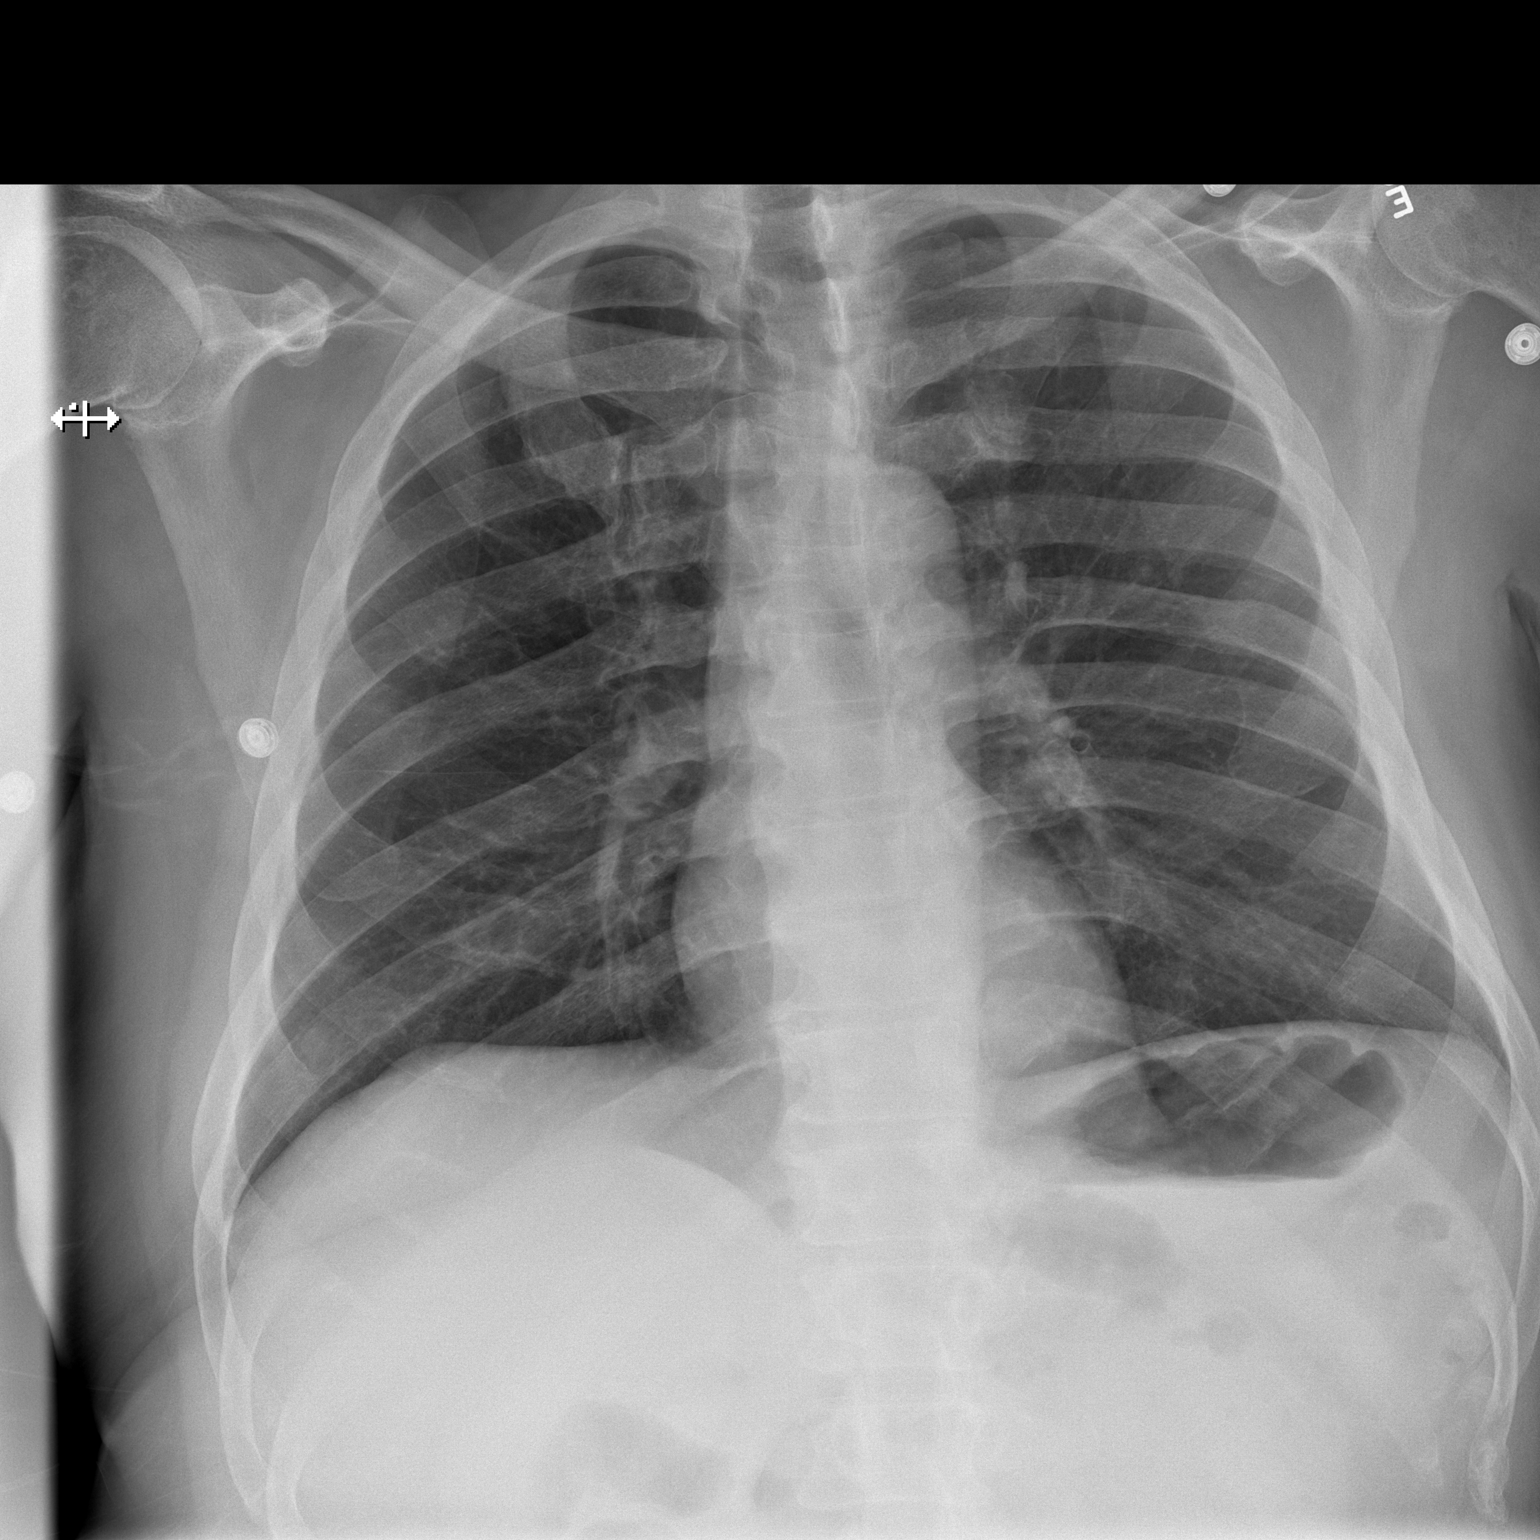

[w chest lat]
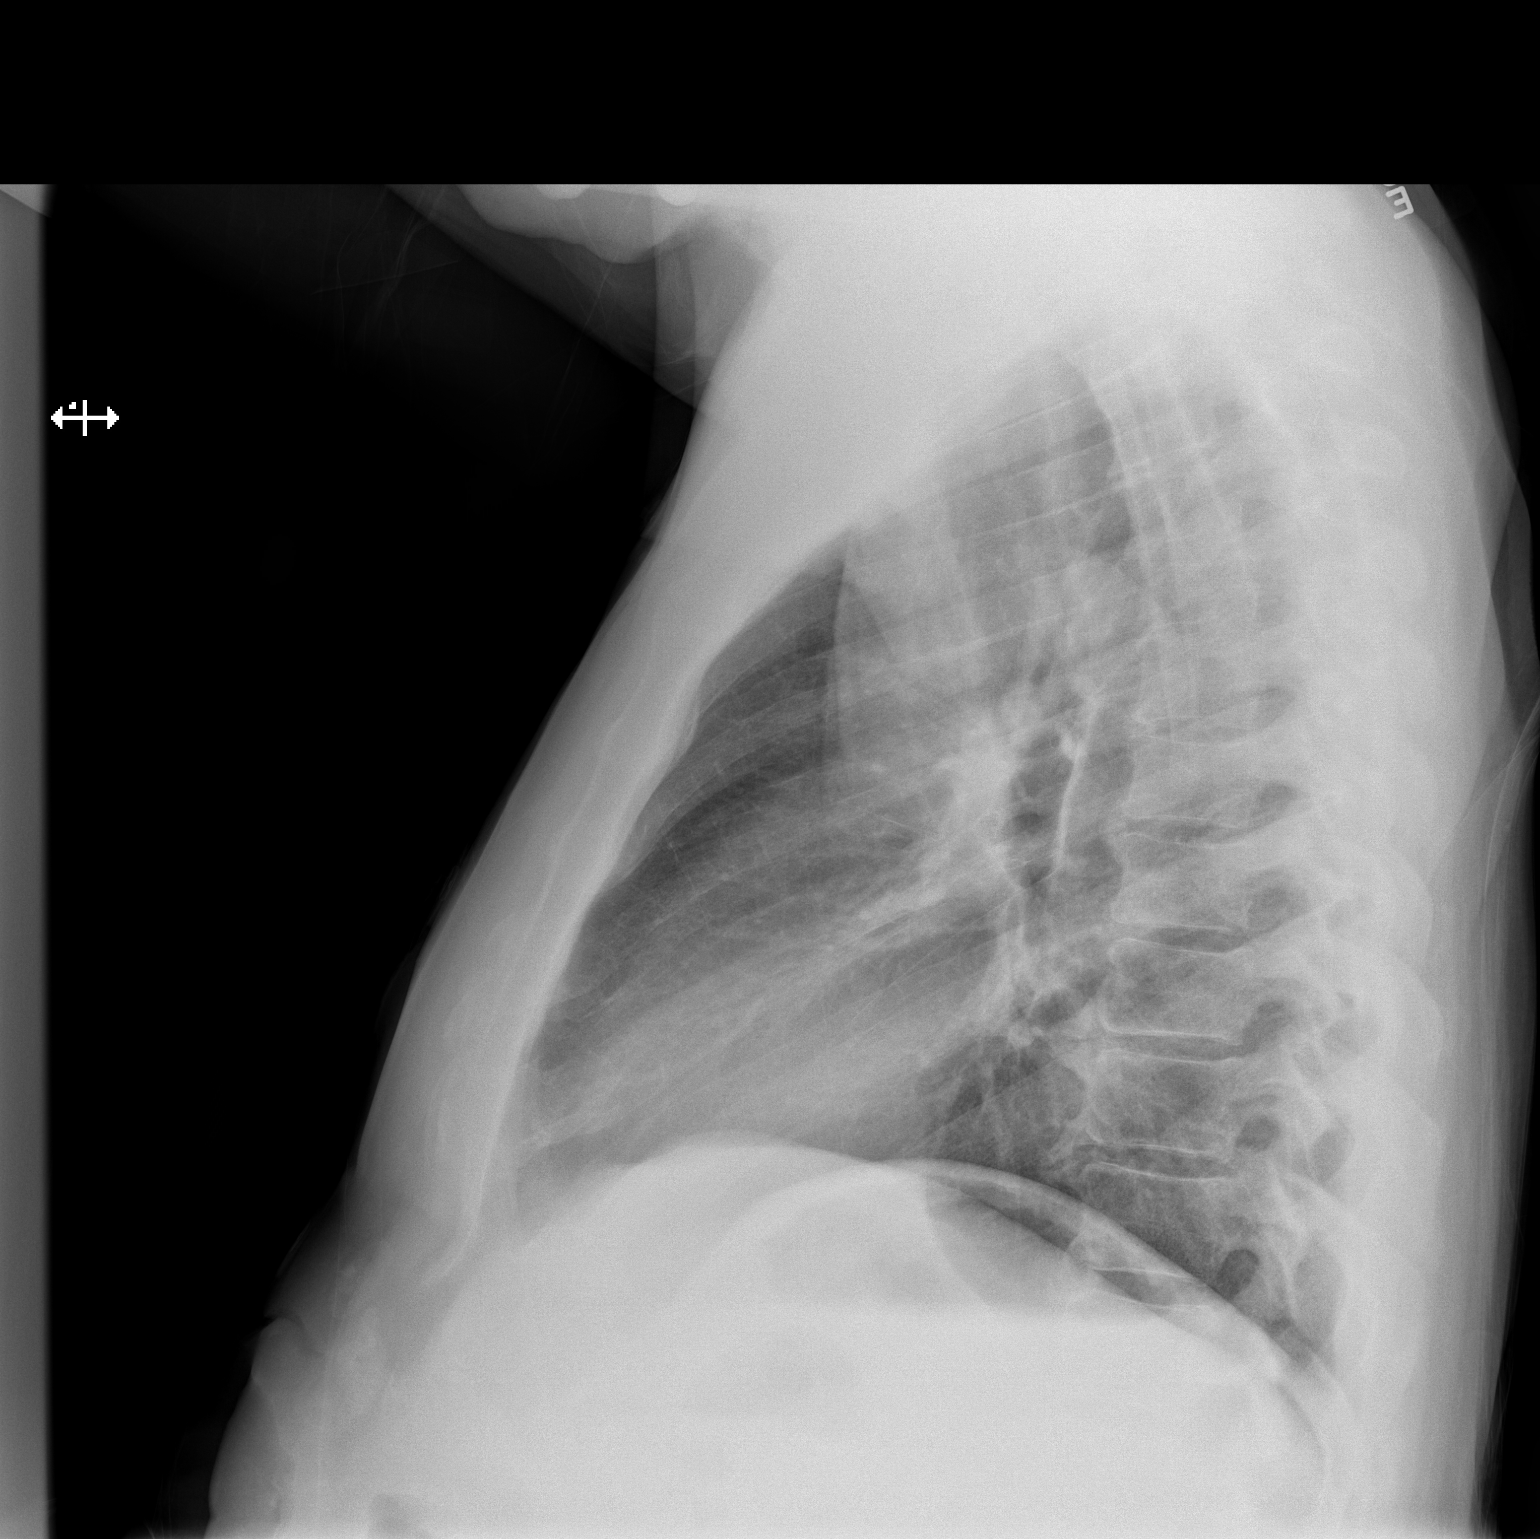

[2 of 2 positions shown; findings below may reference images not displayed]

FINDINGS: Normal heart size.  Clear lungs.  No pneumothorax.  No
pleural effusion.
IMPRESSION: No active cardiopulmonary disease.

## 2012-04-13 IMAGING — US US RENAL
1 series · 14 of 25 positions shown · non-contrast
Comparison: None.

CLINICAL DATA: Acute renal failure

RENAL/URINARY TRACT ULTRASOUND COMPLETE

[Series 1: us renal · 0.28mm/px · 14 of 32 slices shown]
[im 1/32]
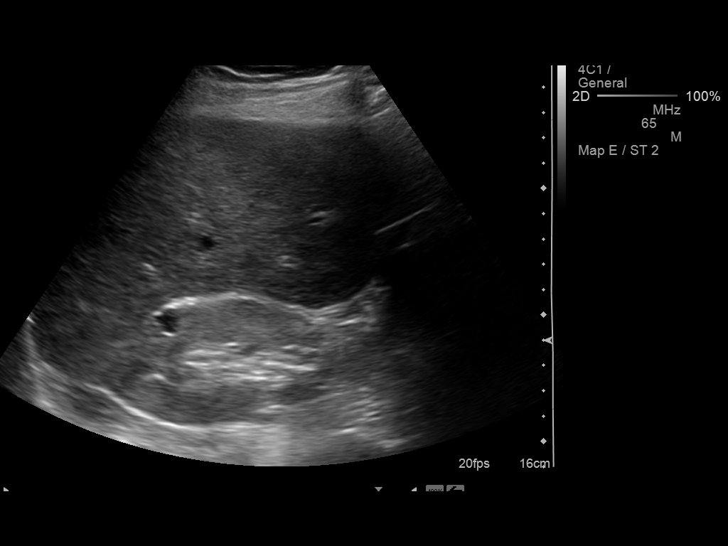
[im 3/32]
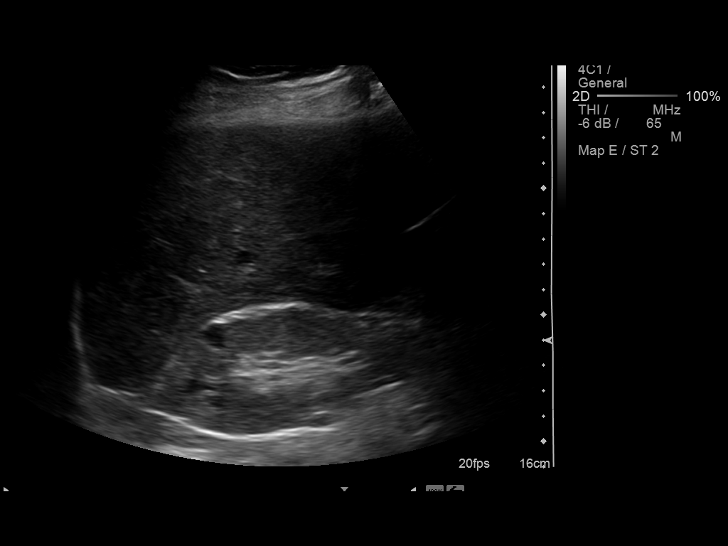
[im 6/32]
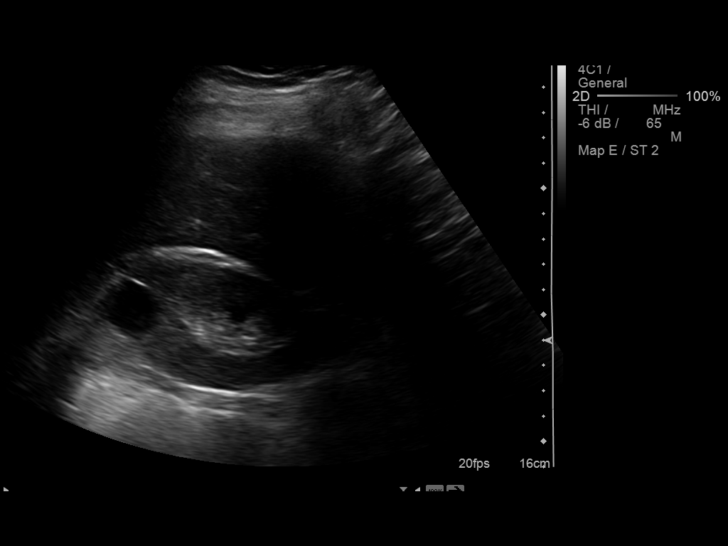
[im 8/32]
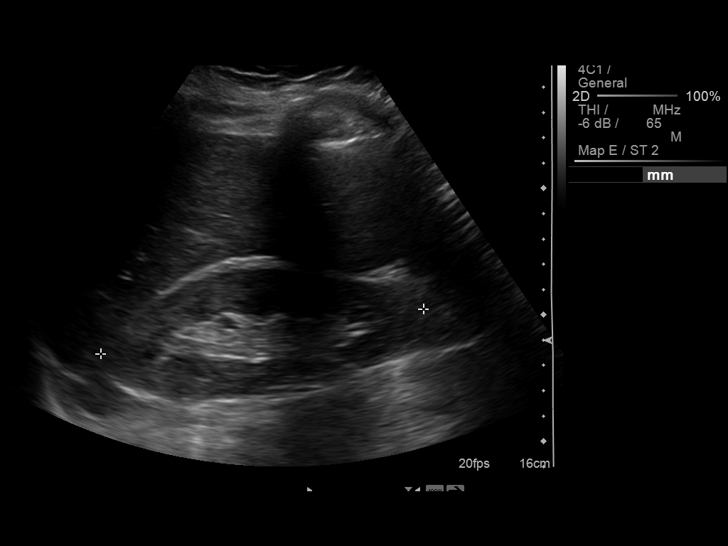
[im 11/32]
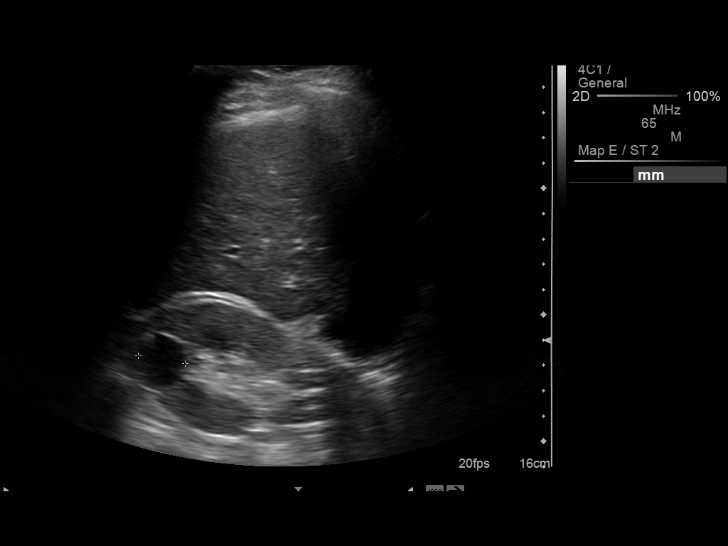
[im 12/32]
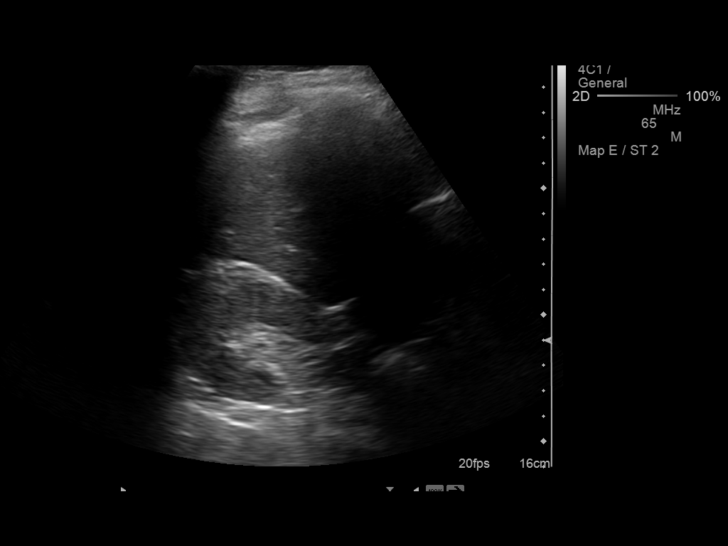
[im 15/32]
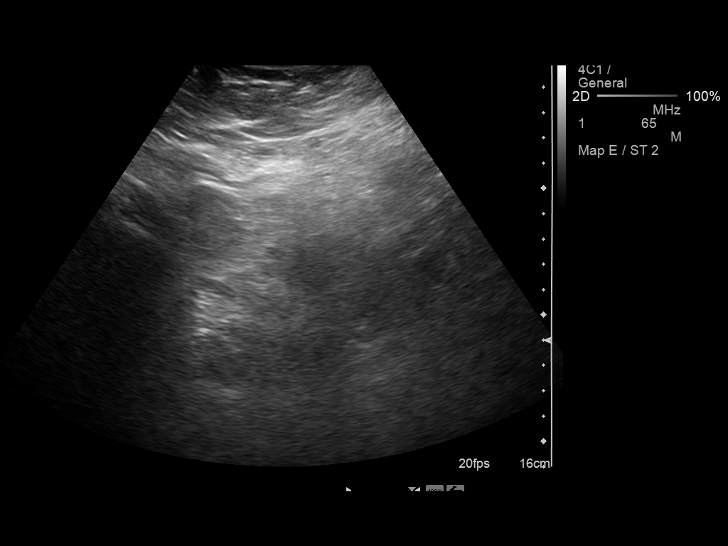
[im 17/32]
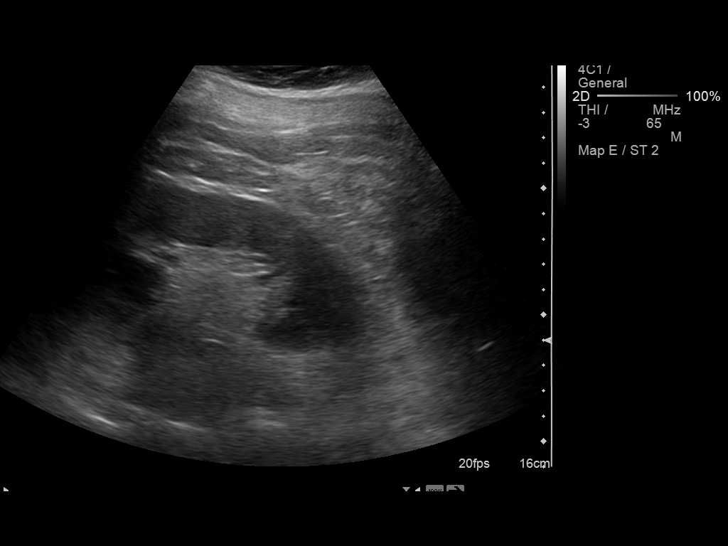
[im 20/32]
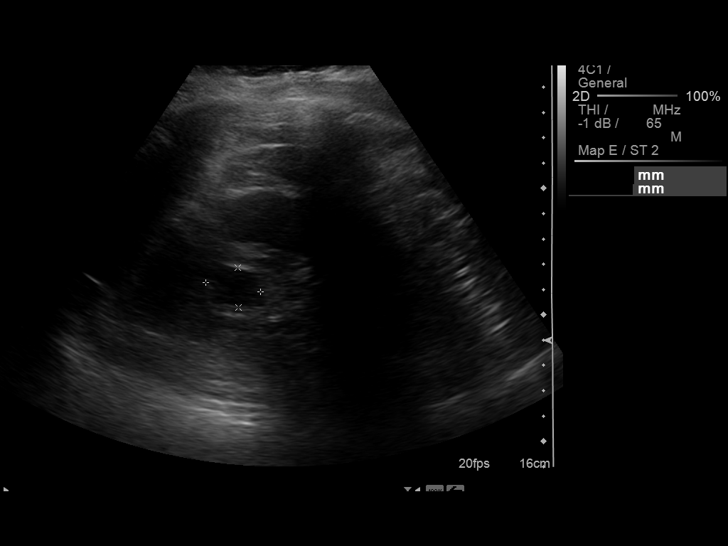
[im 21/32]
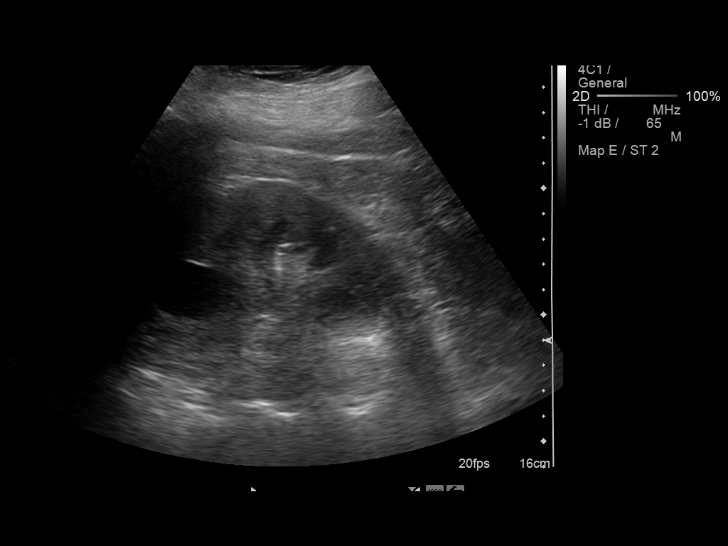
[im 24/32]
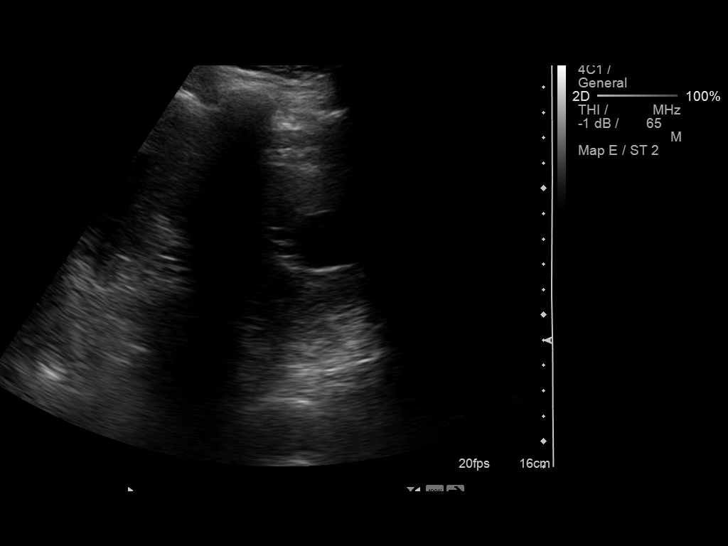
[im 26/32]
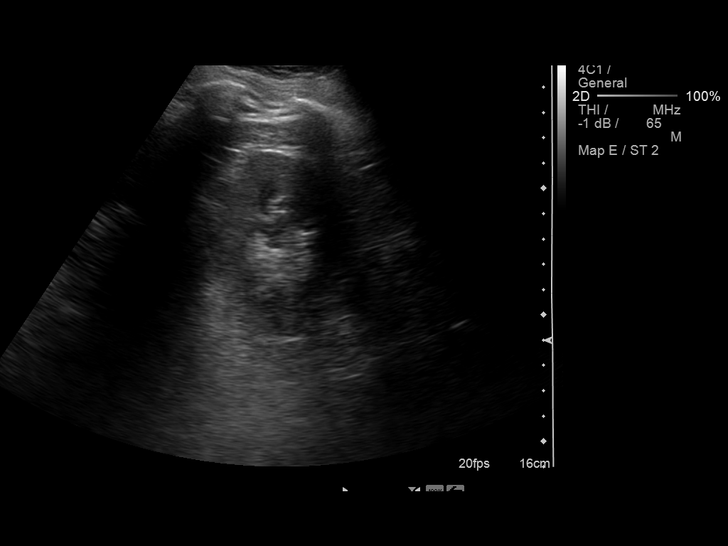
[im 29/32]
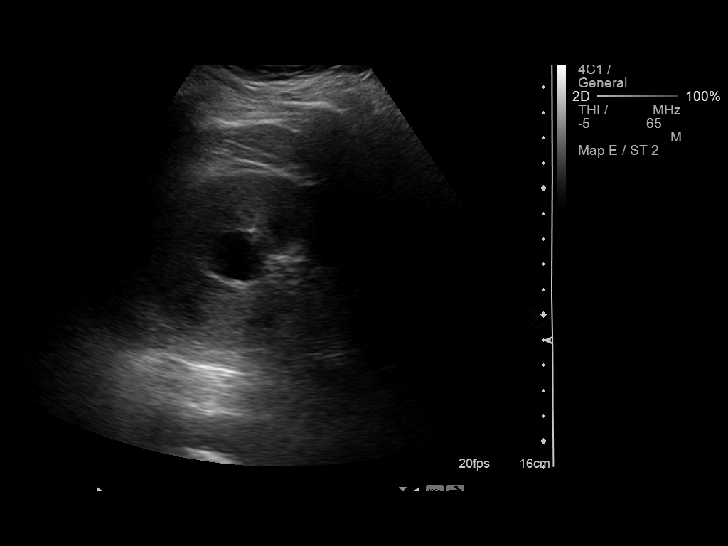
[im 32/32]
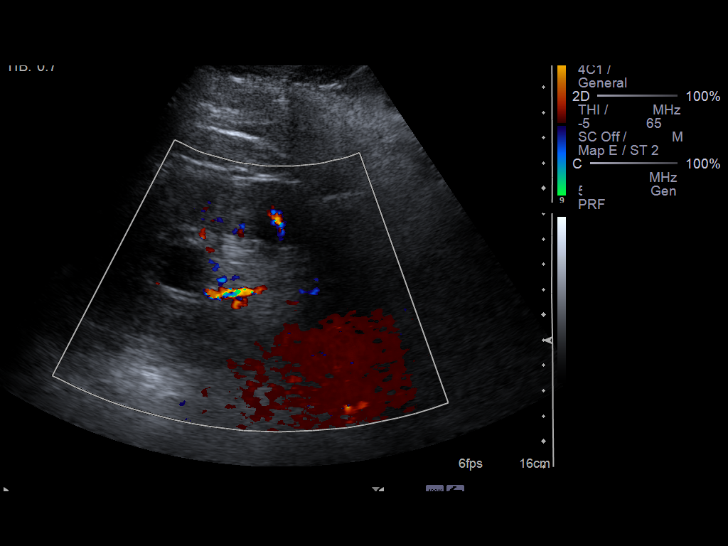

[14 of 25 positions shown; findings below may reference images not displayed]

FINDINGS: Right Kidney:  Right renal cysts noted. The largest is in the upper
pole measuring 2.6 x 1.9 x 2.2 cm.  Normal in size and parenchymal
echogenicity.  No evidence of mass or hydronephrosis.

Left Kidney:  Upper pole left renal cyst measures 2.2 x 1.6 x
cm.  Normal in size and parenchymal echogenicity.  No evidence of
mass or hydronephrosis.

Bladder:  Appears normal for degree of bladder distention.
IMPRESSION: 1.  Bilateral renal cyst.
2.  No obstructive uropathy.

## 2012-04-13 MED ORDER — ACETAMINOPHEN 325 MG PO TABS
650.0000 mg | ORAL_TABLET | Freq: Four times a day (QID) | ORAL | Status: DC | PRN
  Administered 2012-04-15 – 2012-04-19 (×10): 650 mg via ORAL
  Filled 2012-04-13: qty 2
  Filled 2012-04-13: qty 1
  Filled 2012-04-13 (×9): qty 2

## 2012-04-13 MED ORDER — ATORVASTATIN CALCIUM 20 MG PO TABS
20.0000 mg | ORAL_TABLET | Freq: Every day | ORAL | Status: DC
  Administered 2012-04-13 – 2012-04-21 (×9): 20 mg via ORAL
  Filled 2012-04-13 (×10): qty 1

## 2012-04-13 MED ORDER — SODIUM CHLORIDE 0.45 % IV SOLN
INTRAVENOUS | Status: AC
  Administered 2012-04-13 – 2012-04-14 (×3): via INTRAVENOUS
  Filled 2012-04-13 (×11): qty 75

## 2012-04-13 MED ORDER — SODIUM BICARBONATE 8.4 % IV SOLN: INTRAVENOUS | Status: DC

## 2012-04-13 MED ORDER — INSULIN GLARGINE 100 UNIT/ML ~~LOC~~ SOLN
10.0000 [IU] | Freq: Every day | SUBCUTANEOUS | Status: DC
  Administered 2012-04-13: 10 [IU] via SUBCUTANEOUS

## 2012-04-13 MED ORDER — SODIUM CHLORIDE 0.9 % IV BOLUS (SEPSIS)
1000.0000 mL | Freq: Once | INTRAVENOUS | Status: AC
  Administered 2012-04-13: 1000 mL via INTRAVENOUS

## 2012-04-13 MED ORDER — SODIUM POLYSTYRENE SULFONATE 15 GM/60ML PO SUSP
15.0000 g | Freq: Once | ORAL | Status: AC
  Administered 2012-04-13: 15 g via ORAL
  Filled 2012-04-13: qty 60

## 2012-04-13 MED ORDER — HEPARIN SODIUM (PORCINE) 5000 UNIT/ML IJ SOLN
5000.0000 [IU] | Freq: Three times a day (TID) | INTRAMUSCULAR | Status: DC
  Administered 2012-04-13 – 2012-04-21 (×23): 5000 [IU] via SUBCUTANEOUS
  Filled 2012-04-13 (×29): qty 1

## 2012-04-13 MED ORDER — INSULIN ASPART 100 UNIT/ML ~~LOC~~ SOLN: 0.0000 [IU] | Freq: Three times a day (TID) | SUBCUTANEOUS | Status: DC

## 2012-04-13 MED ORDER — SODIUM CHLORIDE 0.9 % IJ SOLN
3.0000 mL | Freq: Two times a day (BID) | INTRAMUSCULAR | Status: DC
  Administered 2012-04-16 – 2012-04-20 (×3): 3 mL via INTRAVENOUS
  Administered 2012-04-20: 09:00:00 via INTRAVENOUS
  Administered 2012-04-21 – 2012-04-22 (×3): 3 mL via INTRAVENOUS

## 2012-04-13 MED ORDER — SODIUM CHLORIDE 0.45 % IV SOLN
INTRAVENOUS | Status: DC
  Administered 2012-04-14 – 2012-04-15 (×3): via INTRAVENOUS
  Filled 2012-04-13 (×9): qty 75

## 2012-04-13 MED ORDER — LEVOFLOXACIN 250 MG PO TABS
250.0000 mg | ORAL_TABLET | Freq: Every day | ORAL | Status: DC
  Administered 2012-04-13 – 2012-04-15 (×3): 250 mg via ORAL
  Filled 2012-04-13 (×4): qty 1

## 2012-04-13 MED ORDER — LOPERAMIDE HCL 2 MG PO CAPS
2.0000 mg | ORAL_CAPSULE | Freq: Four times a day (QID) | ORAL | Status: DC | PRN
  Filled 2012-04-13: qty 1

## 2012-04-13 MED ORDER — ACETAMINOPHEN 650 MG RE SUPP: 650.0000 mg | Freq: Four times a day (QID) | RECTAL | Status: DC | PRN

## 2012-04-13 MED ORDER — ONDANSETRON HCL 4 MG/2ML IJ SOLN
4.0000 mg | Freq: Four times a day (QID) | INTRAMUSCULAR | Status: DC | PRN
  Administered 2012-04-13: 4 mg via INTRAVENOUS
  Filled 2012-04-13: qty 2

## 2012-04-13 MED ORDER — SODIUM CHLORIDE 0.9 % IV SOLN
INTRAVENOUS | Status: DC
  Administered 2012-04-13: 150 mL via INTRAVENOUS

## 2012-04-13 NOTE — ED Notes (Signed)
Report given to nurse for room 6715.

## 2012-04-13 NOTE — Telephone Encounter (Signed)
Call from [place], [place] nurse. States pt has not voided since yesterday morning - bladder is non-distended. BP 100/60, c/o dizziness when tries to stand. Poor skin tugor. And having N/V. States pt will be going to the ED. I will inform pt's PCP and the Attending.

## 2012-04-13 NOTE — Consult Note (Signed)
[facility]        RENAL CONSULT   Reason for Consult: Acute renal failure Referring Physician: Dr. Olivia [name] is a 61 y.o. black man who underwent right hemicolectomy 02/06/2012 for mass in colon  (dysplastic polyp). Unfortunately he developed an anastomotic leak with intra-abdominal abscess and underwent repeat surgery on 15 September by Dr. Molli [name] (exploratory lap with washout, resection of anastomosis with end ileostomy, and creation of a long Hartmann's pouch). The abdomen was left open and multiple further washouts were done. A wound VAC was used and then the abdomen was closed on 25 September. Creatinine was 1 on 7 October at the time of discharge.   [name] was readmitted with vomiting and increased output from his ileostomy on 03/30/2012. At that time he had BUN of 156 and creatinine 17. Bicarbonate was 16 and potassium 6.5. He was treated with IV fluids and left the hospital on 3 November with creatinine of 3.6.  He is again admitted this evening with increased output from ostomy, anorexia, nausea, and worsened renal function. Renal consult was requested by Dr. Daryll [name]    Past Medical History  Diagnosis Date  . Hyperlipidemia   . Hypertension   . Heart murmur   . Diabetes mellitus     type 2 IDDM x 15 years  . Sleep apnea     does not wear CPAP now  . Colon polyp   . Arthritis     left knee  . 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 12/25/2011    Family History  Problem Relation Age of Onset  . Diabetes Father   . Heart disease Father   . Colon cancer Neg Hx   . Diabetes Sister   . Heart disease Sister   . Heart disease Mother   [name]' father [name], [name]) was one of our patients on dialysis in [place]. He died about 10 years ago at age 53. His mother (age 54) is healthy. He had 2 brothers (one died of AIDS)-- his other brother is healthy.  He has one sister who is healthy.  He had 4 children--one died of cancer--the other  3 are healthy  Social History: Born and grew up in [place], graduated from [employer], Lives by himself, he was married 43 yr but he and his wife divorced 4 years ago.  He smokes cigarettes (30 pack years), drinks alcohol once a month (none since his surgery).  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   Allergies: No Known Allergies  Meds prior to admission: Lipitor 20/D., Levaquin 250/D., Imodium 2 mg prn, metformin 1G/day, valsartan and 80/D.  Current Medications: He received normal saline and Kayexalate in the emergency room (it is not possible for me to determine how much he received). At the time of my examination he has a saline lock with no IV fluids being given  ROS- no angina, no claudication, no melena, no hematochezia, no gross hematuria, no renal colic, some decreased urinary output, no nocturia, no doe, no orthopnea, no purulent sputum, no hemoptysis, no weight gain or weight loss, no cold or heat intolerance. He has noted nausea, vomiting, and marked ostomy output   Physical examination: Temperature 98.6, pulse 84, respirations 22 Nose, mouth, pharynx-dry mucous membranes, no teeth in maxilla, no inflammation or abscess Neck--not stiff Chest-clear Heart-no rub is heard Abdomen-ostomy with yellow diarrhea, nontender, bowel sounds  present GU-Foley catheter in place Extremities-no rash, no edema, no arthritis Neuro-right-handed, strength equal, sensation intact  Laboratory: Hemoglobin 11.1, WBC 13,000, platelets 562,000  Sodium 129, potassium 5.4, chloride 92, CO2 19, BUN 84, creatinine 11.2, calcium 9.5, SGOT 36, SGPT 31, alkaline phosphatase 192, albumin 2.9  UA: SG 1.0-9, pH 5, protein 4+, 0-2 wbc's, few bacteria, no RBCs, glucose negative  Chest x-ray: Clear    Assessment/Plan: 1. Acute renal failure/acute kidney injury  Creatinine was 1.0 on 7 October. Creatinine decreased to 3.6 on  04/05/2012. It is not known whether renal function will return to baseline. He is currently volume depleted and acidotic. Would recommend volume resuscitation and alkalai therapy. I have written for 1/2 NS with 75 mEq bicarbonate at 250 cc an hour for 8 hours then 120 cc per hour. I discussed this with Dr. Blaine Hamper. 2.  Diarrhea per ostomy--Imodium has been given.  Stool (for C. difficile and culture) has been ordered. He was on Levaquin prior to admission 3. Prior history diabetes mellitus-with his second episode of acute renal failure in less than one month it is advisable to avoid metformin (in my opinion). 4. Prior history hypertension-is not hypertensive now. He had been on valsartan prior to admission. This should be avoided for now 5. Right hemicolectomy/anastomotic leak/intra-abdominal abscess-S./P. surgical correction(s).  Surgery should be informed of his admission. They may have some insight into why he has had such severe diarrhea causing 2 admissions in 2 weeks   [name] F 04/13/2012, 6:06 PM

## 2012-04-13 NOTE — ED Notes (Addendum)
Per EMS, pt was in the hospital last week for dehydration, found out his kidneys were not functioning properly and received dialysis twice last week. Pt has been feeling bad for 1 week, had decreased urine output, has n/v and constipation. Home healthcare nurse was putting pt in the car to come to ED and when they stood him up he almost passed out, so they called EMS. Received 421ml bolus from ems.

## 2012-04-13 NOTE — H&P (Signed)
Hospital Admission Note Date: 04/13/2012  Patient name: [name] Medical record number: [id number] Date of birth: May 19, 1951 Age: 61 y.o. Gender: male PCP: [name], MD  Internal Medicine Teaching Service  Attending physician:  Dr. Marinda Elk  Chief Complaint: N/V, oliguria  History of Present Illness:  [name] is a 61 yo gentleman with a history of colon adenocarcinoma s/p r hemicolectomy complicated by anastamotic leak with intraabdominal abscess and surgical site infection, recently discharged from the teaching service after being treated for acute renal injury requiring hemodialysis. He presents from home with recurrent symptoms of nausea, vomiting, and high output from his ileostomy for the past 4 days, looks like clear liquid with bits of solids. He states that he has not been able to keep anything down and has become more and more dehydrated. He vomits everything he eats and drinks and has had to change is ostomy bag more than usual. He also states that he hasn't urinated since the day before admission and it was a small amount. He was concerned and presented to the ED.  Of note, he states there was a 3-day delay in filling the rx for levaquin and tramadol from his recent discharge. This was 2/2 finances. His abdominal wound has been doing well, no increase in drainage, continues to "close up."  Review of systems + for an episode of "purple" vision that lasted 3-5 minutes a few days ago.  Denies dysuria, fever, chills, chest pain, shortness of breath.   Meds:   Medication List     As of 04/13/2012  3:59 PM    ASK your doctor about these medications         atorvastatin 20 MG tablet   Commonly known as: LIPITOR   Take 20 mg by mouth at bedtime.      insulin NPH-insulin regular (70-30) 100 UNIT/ML injection   Commonly known as: NOVOLIN 70/30   Inject 10-30 Units into the skin 2 (two) times daily with a meal. Inject 30 units in the morning and 10 units at  bedtime.      levofloxacin 250 MG tablet   Commonly known as: LEVAQUIN   Take 1 tablet (250 mg total) by mouth daily.      loperamide 2 MG capsule   Commonly known as: IMODIUM   Take 1 capsule (2 mg total) by mouth 4 (four) times daily as needed for diarrhea or loose stools (for very loose ileostomy output; output should be thicker (pancake batter consistency) not watery).      metFORMIN 1000 MG tablet   Commonly known as: GLUCOPHAGE   Take 1,000 mg by mouth 2 (two) times daily with a meal.      valsartan 80 MG tablet   Commonly known as: DIOVAN   Take 40 mg by mouth daily.        Allergies: Allergies as of 04/13/2012  . (No Known Allergies)   Past Medical History  Diagnosis Date  . Hyperlipidemia   . Hypertension   . Heart murmur   . Diabetes mellitus     type 2 IDDM x 15 years  . Sleep apnea     does not wear CPAP now  . Colon polyp   . Arthritis     left knee  . 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 Colonoscopy   . Ganglion cyst excision 20 YRS AGO    RT ARM  . Partial colectomy 02/06/2012  .  Partial colectomy 02/06/2012    Procedure: PARTIAL COLECTOMY;  Surgeon: Imogene Burn. [name], MD;  Location: [place];  Service: General;  Laterality: N/A;  right partial colectomy  . Colonoscopy 02/06/2012    Procedure: COLONOSCOPY;  Surgeon: Milus Banister, MD;  Location: [place];  Service: Endoscopy;;  . Laparotomy 02/16/2012    Procedure: EXPLORATORY LAPAROTOMY;  Surgeon: Zenovia Jarred, MD;  Location: [place];  Service: General;  Laterality: N/A;  Exploratory Laparotomy with resection of anastomosis  . Ileostomy 02/16/2012    Procedure: ILEOSTOMY;  Surgeon: Zenovia Jarred, MD;  Location: [place];  Service: General;  Laterality: N/A;  . Application of wound vac 02/16/2012    Procedure: APPLICATION OF WOUND VAC;  Surgeon: Zenovia Jarred, MD;  Location: [place];  Service: General;  Laterality: N/A;  . Laparotomy 02/18/2012      Procedure: EXPLORATORY LAPAROTOMY;  Surgeon: Odis Hollingshead, MD;  Location: [place];  Service: General;  Laterality: N/A;  exploratory laparotomy  change of abdominal vac dressing  . Dressing change under anesthesia 02/18/2012    Procedure: DRESSING CHANGE UNDER ANESTHESIA;  Surgeon: Odis Hollingshead, MD;  Location: [place];  Service: General;  Laterality: N/A;  . Laparotomy 02/20/2012    Procedure: EXPLORATORY LAPAROTOMY;  Surgeon: Odis Hollingshead, MD;  Location: [place];  Service: General;  Laterality: N/A;  . Vacuum assisted closure change 02/20/2012    Procedure: ABDOMINAL VACUUM ASSISTED CLOSURE CHANGE;  Surgeon: Odis Hollingshead, MD;  Location: [place];  Service: General;;  . Laparotomy 02/23/2012    Procedure: EXPLORATORY LAPAROTOMY;  Surgeon: Joyice Faster. Cornett, MD;  Location: [place];  Service: General;  Laterality: N/A;  Irrigation and Debridement of abdominal wound with wound vac change with partial closure  . Incision and drainage of wound 02/23/2012    Procedure: IRRIGATION AND DEBRIDEMENT WOUND;  Surgeon: Joyice Faster. Cornett, MD;  Location: [place];  Service: General;  Laterality: N/A;  . Vacuum assisted closure change 02/23/2012    Procedure: ABDOMINAL VACUUM ASSISTED CLOSURE CHANGE;  Surgeon: Joyice Faster. Cornett, MD;  Location: [place];  Service: General;  Laterality: N/A;  . Laparotomy 02/26/2012    Procedure: EXPLORATORY LAPAROTOMY;  Surgeon: Imogene Burn. [name], MD;  Location: [place];  Service: General;  Laterality: N/A;     . Application of wound vac 02/26/2012    Procedure: APPLICATION OF WOUND VAC;  Surgeon: Imogene Burn. [name], MD;  Location: [place];  Service: General;  Laterality: N/A;   Family History  Problem Relation Age of Onset  . Diabetes Father   . Heart disease Father   . Colon cancer Neg Hx   . Diabetes Sister   . Heart disease Sister   . Heart disease Mother    History   Social History  . Marital Status: Divorced    Spouse Name: N/A    Number of Children: N/A  . Years of  Education: N/A   Occupational History  . Not on file.   Social History Main Topics  . Smoking status: Current Every Day Smoker -- 0.5 packs/day for 30 years    Types: Cigarettes  . Smokeless tobacco: Never Used     Comment: faxing referral to quitline when signed by patient  . Alcohol Use: Yes     Comment: LIQUOR OR WINE WEEKLY  . Drug Use: No  . Sexually Active: Not Currently   Other Topics Concern  . Not on file   Social History Narrative   Financial  assistance approved for 100% discount at [facility] and has [place]  August 08, 2009 5:48 PM*PATIENT WAS GIVEN DM CARD.Lela Sturdivant NT II  June 08, 2010 3:56 PM    Review of Systems: Pertinent items noted in HPI   Physical Exam Blood pressure 92/59, pulse 81, temperature 97.6 F (36.4 C), temperature source Oral, resp. rate 22, SpO2 97.00%. General:  No acute distress, alert and oriented x 3 HEENT:  PERRL, EOMI, drymucous membranes Cardiovascular:  Regular rate and rhythm, no murmurs Respiratory:  Clear to auscultation bilaterally, no wheezes, rales, or rhonchi Abdomen:  Soft, nondistended, nontender, ostomy right LQ, clean midline abdominal wound, fascial sutures visible, some yellow purulence on dressing, pink granulation tissue, non tender, no surrounding erythema Extremities:  Warm and well-perfused, no clubbing, cyanosis, or edema.  Skin: Warm, dry, no rashes Neuro: Not anxious appearing, no depressed mood, normal affect  Lab results: Basic Metabolic Panel:  Basename 04/13/12 1332  NA 129*  K 5.4*  CL 92*  CO2 19  GLUCOSE 84  BUN 84*  CREATININE 11.24*  CALCIUM 9.5  MG --  PHOS --   Liver Function Tests:  Basename 04/13/12 1332  AST 36  ALT 31  ALKPHOS 192*  BILITOT 0.8  PROT 8.3  ALBUMIN 2.9*    Basename 04/13/12 1332  LIPASE 55  AMYLASE --   CBC:  Basename 04/13/12 1332  WBC 13.0*  NEUTROABS 7.3  HGB 11.1*  HCT 32.0*  MCV 71.1*  PLT 562*   Cardiac Enzymes:  Basename  04/13/12 1332  CKTOTAL --  CKMB --  CKMBINDEX --  TROPONINI <0.30   Coagulation:  Basename 04/13/12 1332  LABPROT 14.6  INR 1.16   Urinalysis:  Basename 04/13/12 1547  COLORURINE AMBER*  LABSPEC 1.029  PHURINE 5.0  GLUCOSEU NEGATIVE  HGBUR NEGATIVE  BILIRUBINUR SMALL*  KETONESUR 15*  PROTEINUR >300*  UROBILINOGEN 1.0  NITRITE NEGATIVE  LEUKOCYTESUR SMALL*     Imaging results:  Dg Chest 2 View  04/13/2012  *RADIOLOGY REPORT*  Clinical Data: Chest pain  CHEST - 2 VIEW  Comparison: 03/02/2012  Findings: Normal heart size.  Clear lungs.  No pneumothorax.  No pleural effusion.  IMPRESSION: No active cardiopulmonary disease.   Original Report Authenticated By: Marybelle Killings, M.D.     Other results: EKG: no peaked T waves, no STE or STD  Assessment & Plan by Problem:  Rodney. Garris is a 61 yo gentleman with a history of type 2 diabetes on insulin, invasive adenocarcinoma of colon s/p right hemicolectomy complicated by postop intra-abdominal abscess with a leaking anastamosis, OSA, hypertension, who presents with nausea and vomiting.  Acute renal failure - recurrent. Likely secondary to extreme volume depletion in the setting of nausea, vomiting, increased ileostomy output. Creatinine is 11.2 and BUN is 84 today. Patient's creatinine was 1.0 on 7 October. In his previous admission, he had BUN of 156 and creatinine 17 in Oct. Bicarbonate was 16 and potassium 6.5. It was likely related to his IV contrast used for abdominal CT. He was treated with IV fluids and left the hospital on 3 November with creatinine of 3.6.  Secondary to nausea/vomiting, patient is a severely volume depleted and acidotic. Anion gap is 18, metabolic acidosis likely due to renal failure. Renal was consulted, Dr. Hassell Done evaluated the patient. Dr. Hassell Done to suggest to treat patient with volume resuscitation and alkalai therapy.  - Appreciate renal consultation is in managing our patient. - Will treat with 1/2 NS with 75  mEq bicarbonate at 250 cc an hour for 8 hours then 120 cc per hour per Dr. Hassell Done. - Repeat BMP at midnight to follow gap - Strict I and O's, daily weights  - Zofran PRN  - Renal ultrasound  - Follow BMP   # Hyperkalemia: K was 5.4 on admission without EKG change. Patient received 1 dose of Kayexalate, 15 g in ED.  -will treatwith alkalai herapy And IV fluids. -will repeat BMP  # Colon adenocarcinoma S/p right hemicolectomy and ileostomy creation. Elevated WBC to 13.0, but afebrile.  He had hx of abdominal abscess 2/2 leaking anastamosis. Today, ileostomy site appears clear, +output. He previous CT scan abd/pelvis showed inflammation of omental fat, no focal abscess noted, no SBO. Pt with abdominal pain around incision, no rebound or guarding. Does not appear septic. - Ileostomy care - Follow CBC, vitals  - Consider to consult surgery, if condition worse.   # Nausea, vomiting and diarrhea: Patient has been on Levaquin recently, which puts patient at risk of getting C. difficile colitis. Also consider pancreatitis. Patient had elevated lipase in the past (lipase was 143 on 03/29/12).   - Will check C diff PCR. - Will check Lipase - Will treat with Zofran for nausea. - NPO until get lipase back  #Leukocytosis: WBC 13 on admission. Hx of UTI during last admission and pt delayed in getting levaquin therapy. Patient specifically denies dysuria, fever, chills. Still has a leukocytosis, may be from uncleared UTI, stress reaction, AKI. Urine cx grew pan sensitive E. coli -will cont levaquin to treat as complicated UTI in a male  # DM-II:  Last A1c was 6.2 at 03/02/12. Patient has been on metformin and Novolin 70/30 at home. -hold metformin as pt with metabolic acidosis and renal failure  -Lantus 10u daily with correction scale. - SSI.  # HTN: Patient blood pressure is running low. Blood pressure is 92/59 on admission.  -Will hold home medication (Diovan) due to low Bp and renal failure. Will  treat with IVF  # PPX: heparin [place]    Signed: Santa Lighter 04/13/2012, 3:59 PM

## 2012-04-13 NOTE — ED Notes (Signed)
Patient transported to X-ray 

## 2012-04-13 NOTE — Progress Notes (Signed)
Pt admitted to the unit for ARF. Pt alert and oriented x4. Unable to ambulate d/t weakness and dizziness per pt. Has an ostomy bag on RLQ. Pt has an abdominal incision with wet to dry dressing dry and intact. Incision open with sutures present. VS stable. Pt oriented to the unit and staff. Will cont to monitor.

## 2012-04-13 NOTE — ED Notes (Signed)
Phlebotomy at the bedside  

## 2012-04-13 NOTE — ED Provider Notes (Addendum)
61 year old male was discharged from the hospital only to have another set of acute renal failure. Since discharge, he continues to be dizzy and lightheaded when standing and he continues to have nausea and vomiting. Not been able to tolerate any solid food and only small amounts of liquids. His urine output has been markedly decreased and he has not urinated at all since yesterday. He does have a colostomy and it has been draining a lot. On arrival, he was hypotensive and is given IV fluids. Remainder of his exam is unremarkable. The tray workup shows worsening renal failure the creatinine of 11. Creatinine was about 3 on discharge. Of note, hemoglobin has gone from 8.8 to 11. This is probably secondary to hemoconcentration from dehydration. He is mildly hyperkalemic and will be given a dose of Kayexalate and will need to be readmitted and considered for dialysis.  I saw and evaluated the patient, reviewed the resident's note and I agree with the findings and plan.   Delora Fuel, MD [id number]  CRITICAL CARE Performed by: [id number]   Total critical care time: 50 minutes  Critical care time was exclusive of separately billable procedures and treating other patients.  Critical care was necessary to treat or prevent imminent or life-threatening deterioration.  Critical care was time spent personally by me on the following activities: development of treatment plan with patient and/or surrogate as well as nursing, discussions with consultants, evaluation of patient's response to treatment, examination of patient, obtaining history from patient or surrogate, ordering and performing treatments and interventions, ordering and review of laboratory studies, ordering and review of radiographic studies, pulse oximetry and re-evaluation of patient's condition.   Delora Fuel, MD [id number]

## 2012-04-13 NOTE — ED Provider Notes (Signed)
History     CSN: [id number]  Arrival date & time 04/13/12  1308   First MD Initiated Contact with Patient 04/13/12 1333      Chief Complaint  Patient presents with  . Hypotension    (Consider location/radiation/quality/duration/timing/severity/associated sxs/prior treatment) HPI Comments: Patient was discharged one week ago for an episode of acute renal failure for which he required 2 episodes of hemodialysis. By the time of discharge his kidneys had recovered and he had normal urine output. Over the past 4 days he has had recurrence of his vomiting and diarrhea, today he became extremely lightheaded and thought he was going to pass out when he stood up. Denies any upper respiratory symptoms, fever, chest pain or shortness of breath, abdominal pain. He does note that he was supposed to take antibiotics for his urinary tract infection when he was discharged last week but he did not start his antibiotics until 3 days ago. His last void was yesterday morning.  Patient is a 61 y.o. male presenting with vomiting. The history is provided by the patient and a relative. No language interpreter was used.  Emesis  This is a recurrent problem. The current episode started more than 2 days ago. The problem occurs 2 to 4 times per day. The problem has been gradually worsening. The emesis has an appearance of stomach contents. There has been no fever. Associated symptoms include chills (when standing up) and diarrhea (increased watery op from ostomy). Pertinent negatives include no abdominal pain, no arthralgias, no cough, no fever and no URI.    Past Medical History  Diagnosis Date  . Hyperlipidemia   . Hypertension   . Heart murmur   . Diabetes mellitus     type 2 IDDM x 15 years  . Sleep apnea     does not wear CPAP now  . Colon polyp   . Arthritis     left knee  . 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 12/25/2011    Past Surgical History  Procedure Date  . Colonoscopy   .  Ganglion cyst excision 20 YRS AGO    RT ARM  . Partial colectomy 02/06/2012  . Partial colectomy 02/06/2012    Procedure: PARTIAL COLECTOMY;  Surgeon: Imogene Burn. [name], MD;  Location: [place];  Service: General;  Laterality: N/A;  right partial colectomy  . Colonoscopy 02/06/2012    Procedure: COLONOSCOPY;  Surgeon: Milus Banister, MD;  Location: [place];  Service: Endoscopy;;  . Laparotomy 02/16/2012    Procedure: EXPLORATORY LAPAROTOMY;  Surgeon: Zenovia Jarred, MD;  Location: [place];  Service: General;  Laterality: N/A;  Exploratory Laparotomy with resection of anastomosis  . Ileostomy 02/16/2012    Procedure: ILEOSTOMY;  Surgeon: Zenovia Jarred, MD;  Location: [place];  Service: General;  Laterality: N/A;  . Application of wound vac 02/16/2012    Procedure: APPLICATION OF WOUND VAC;  Surgeon: Zenovia Jarred, MD;  Location: [place];  Service: General;  Laterality: N/A;  . Laparotomy 02/18/2012    Procedure: EXPLORATORY LAPAROTOMY;  Surgeon: Odis Hollingshead, MD;  Location: [place];  Service: General;  Laterality: N/A;  exploratory laparotomy  change of abdominal vac dressing  . Dressing change under anesthesia 02/18/2012    Procedure: DRESSING CHANGE UNDER ANESTHESIA;  Surgeon: Odis Hollingshead, MD;  Location: [place];  Service: General;  Laterality: N/A;  . Laparotomy 02/20/2012    Procedure: EXPLORATORY LAPAROTOMY;  Surgeon: Odis Hollingshead, MD;  Location: [place];  Service: General;  Laterality: N/A;  . Vacuum assisted closure change 02/20/2012    Procedure: ABDOMINAL VACUUM ASSISTED CLOSURE CHANGE;  Surgeon: Odis Hollingshead, MD;  Location: [place];  Service: General;;  . Laparotomy 02/23/2012    Procedure: EXPLORATORY LAPAROTOMY;  Surgeon: Joyice Faster. Cornett, MD;  Location: [place];  Service: General;  Laterality: N/A;  Irrigation and Debridement of abdominal wound with wound vac change with partial closure  . Incision and drainage of wound 02/23/2012    Procedure: IRRIGATION AND DEBRIDEMENT WOUND;   Surgeon: Joyice Faster. Cornett, MD;  Location: [place];  Service: General;  Laterality: N/A;  . Vacuum assisted closure change 02/23/2012    Procedure: ABDOMINAL VACUUM ASSISTED CLOSURE CHANGE;  Surgeon: Joyice Faster. Cornett, MD;  Location: [place];  Service: General;  Laterality: N/A;  . Laparotomy 02/26/2012    Procedure: EXPLORATORY LAPAROTOMY;  Surgeon: Imogene Burn. [name], MD;  Location: [place];  Service: General;  Laterality: N/A;     . Application of wound vac 02/26/2012    Procedure: APPLICATION OF WOUND VAC;  Surgeon: Imogene Burn. [name], MD;  Location: [facility];  Service: General;  Laterality: N/A;    Family History  Problem Relation Age of Onset  . Diabetes Father   . Heart disease Father   . Colon cancer Neg Hx   . Diabetes Sister   . Heart disease Sister   . Heart disease Mother     History  Substance Use Topics  . Smoking status: Current Every Day Smoker -- 0.5 packs/day for 30 years    Types: Cigarettes  . Smokeless tobacco: Never Used     Comment: faxing referral to quitline when signed by patient  . Alcohol Use: Yes     Comment: LIQUOR OR WINE WEEKLY      Review of Systems  Constitutional: Positive for chills (when standing up). Negative for fever, diaphoresis, activity change and appetite change.  HENT: Negative for sore throat and neck pain.   Eyes: Negative for discharge and visual disturbance.  Respiratory: Negative for cough, choking and shortness of breath.   Cardiovascular: Negative for chest pain and leg swelling.  Gastrointestinal: Positive for vomiting and diarrhea (increased watery op from ostomy). Negative for nausea, abdominal pain and constipation.  Genitourinary: Positive for decreased urine volume. Negative for dysuria and difficulty urinating.  Musculoskeletal: Negative for back pain and arthralgias.  Skin: Negative for color change and pallor.  Neurological: Positive for light-headedness (severe when standing). Negative for dizziness and speech difficulty.    Psychiatric/Behavioral: Negative for behavioral problems and agitation.  All other systems reviewed and are negative.    Allergies  Review of patient's allergies indicates no known allergies.  Home Medications   Current Outpatient Rx  Name  Route  Sig  Dispense  Refill  . ATORVASTATIN CALCIUM 20 MG PO TABS   Oral   Take 20 mg by mouth at bedtime.         . INSULIN ISOPHANE & REGULAR (70-30) 100 UNIT/ML [place] SUSP   Subcutaneous   Inject 10-30 Units into the skin 2 (two) times daily with a meal. Inject 30 units in the morning and 10 units at bedtime.   10 mL   1   . LEVOFLOXACIN 250 MG PO TABS   Oral   Take 1 tablet (250 mg total) by mouth daily.   7 tablet   0   . LOPERAMIDE HCL 2 MG PO CAPS   Oral   Take 1 capsule (2  mg total) by mouth 4 (four) times daily as needed for diarrhea or loose stools (for very loose ileostomy output; output should be thicker (pancake batter consistency) not watery).   30 capsule   3   . METFORMIN HCL 1000 MG PO TABS   Oral   Take 1,000 mg by mouth 2 (two) times daily with a meal.         . VALSARTAN 80 MG PO TABS   Oral   Take 40 mg by mouth daily.           BP 92/59  Pulse 81  Temp 97.6 F (36.4 C) (Oral)  Resp 22  SpO2 97%  Physical Exam  Constitutional: He appears well-developed. No distress.  HENT:  Head: Normocephalic and atraumatic.  Mouth/Throat: No oropharyngeal exudate.  Eyes: EOM are normal. Pupils are equal, round, and reactive to light. Right eye exhibits no discharge. Left eye exhibits no discharge.  Neck: Normal range of motion. Neck supple. No JVD present.  Cardiovascular: Normal rate, regular rhythm and normal heart sounds.   Pulmonary/Chest: Effort normal and breath sounds normal. No stridor. No respiratory distress. He has no wheezes. He has no rales. He exhibits no tenderness.  Abdominal: Soft. Bowel sounds are normal. He exhibits no distension. There is no tenderness. There is no guarding.       Ostomy  intact w/o pain, not prolapsed. No cellulitis around it. Watery green stool in bag. Also w/ midline supraumb incision that is healing secondarily. Scant white-yellow discharge aruond site, pink granulation tissue at wound base.  Genitourinary: Penis normal.  Musculoskeletal: Normal range of motion. He exhibits no edema and no tenderness.  Neurological: He is alert. No cranial nerve deficit. He exhibits normal muscle tone.  Skin: Skin is warm and dry. He is not diaphoretic. No erythema. No pallor.  Psychiatric: He has a normal mood and affect. His behavior is normal. Judgment and thought content normal.    ED Course  Procedures (including critical care time)  Labs Reviewed  CBC WITH DIFFERENTIAL - Abnormal; Notable for the following:    WBC 13.0 (*)     Hemoglobin 11.1 (*)     HCT 32.0 (*)     MCV 71.1 (*)     MCH 24.7 (*)     RDW 19.0 (*)     Platelets 562 (*)     Lymphs Abs 4.8 (*)     All other components within normal limits  COMPREHENSIVE METABOLIC PANEL - Abnormal; Notable for the following:    Sodium 129 (*)     Potassium 5.4 (*)     Chloride 92 (*)     BUN 84 (*)     Creatinine, Ser 11.24 (*)     Albumin 2.9 (*)     Alkaline Phosphatase 192 (*)     GFR calc non Af Amer 4 (*)     GFR calc Af Amer 5 (*)     All other components within normal limits  URINALYSIS, MICROSCOPIC ONLY - Abnormal; Notable for the following:    Color, Urine AMBER (*)  BIOCHEMICALS MAY BE AFFECTED BY COLOR   APPearance TURBID (*)     Bilirubin Urine SMALL (*)     Ketones, ur 15 (*)     Protein, ur >300 (*)     Leukocytes, UA SMALL (*)     Bacteria, UA FEW (*)     Squamous Epithelial / LPF MANY (*)     All other components within  normal limits  GLUCOSE, CAPILLARY - Abnormal; Notable for the following:    Glucose-Capillary 141 (*)     All other components within normal limits  TROPONIN I  PROTIME-INR  CLOSTRIDIUM DIFFICILE BY PCR  LIPASE, BLOOD  URINE CULTURE  CLOSTRIDIUM DIFFICILE BY PCR    STOOL CULTURE  CBC  COMPREHENSIVE METABOLIC PANEL  PROTIME-INR  BASIC METABOLIC PANEL   Dg Chest 2 View  04/13/2012  *RADIOLOGY REPORT*  Clinical Data: Chest pain  CHEST - 2 VIEW  Comparison: 03/02/2012  Findings: Normal heart size.  Clear lungs.  No pneumothorax.  No pleural effusion.  IMPRESSION: No active cardiopulmonary disease.   Original Report Authenticated By: Marybelle Killings, M.D.    US Renal  04/13/2012  *RADIOLOGY REPORT*  Clinical Data:  Acute renal failure  RENAL/URINARY TRACT ULTRASOUND COMPLETE  Comparison:  None.  Findings:  Right Kidney:  Right renal cysts noted. The largest is in the upper pole measuring 2.6 x 1.9 x 2.2 cm.  Normal in size and parenchymal echogenicity.  No evidence of mass or hydronephrosis.  Left Kidney:  Upper pole left renal cyst measures 2.2 x 1.6 x 3.0 cm.  Normal in size and parenchymal echogenicity.  No evidence of mass or hydronephrosis.  Bladder:  Appears normal for degree of bladder distention.  IMPRESSION: 1.  Bilateral renal cyst. 2.  No obstructive uropathy.   Original Report Authenticated By: Kerby Moors, M.D.      1. Vomiting   2. Diarrhea   3. Acute renal failure   4. Dehydration   5. Acidosis   6. Type II diabetes mellitus, well controlled      Date: 04/13/2012  Rate: 84  Rhythm: normal sinus rhythm  QRS Axis: normal  Intervals: normal  ST/T Wave abnormalities: normal  Conduction Disutrbances: none  Narrative Interpretation: inf Q waves, unchanged from prior  Old EKG Reviewed: No significant changes noted    MDM  3:20 PM recently here for ARF requiring HD. R fem access removed on DC. Appears to have recurred 2/2 dehydration. No infectious source at this point, denies any pain or localizing sxs. He was not entirely adherent to abx for UTI last week so will recheck UA to see if he needs abx. For now will give IVFs, place Foley, consult medicine for admission. Stable. Mild hyperK but no EKG changes. Will temporize.  Admitted in  stable condition        Verdie Shire, MD 04/13/12 [phone]

## 2012-04-14 ENCOUNTER — Ambulatory Visit: Payer: Self-pay | Admitting: Internal Medicine

## 2012-04-14 ENCOUNTER — Encounter (INDEPENDENT_AMBULATORY_CARE_PROVIDER_SITE_OTHER): Payer: Self-pay | Admitting: Surgery

## 2012-04-14 LAB — URINE CULTURE
Colony Count: NO GROWTH
Culture: NO GROWTH

## 2012-04-14 LAB — BASIC METABOLIC PANEL
BUN: 84 mg/dL — ABNORMAL HIGH (ref 6–23)
BUN: 87 mg/dL — ABNORMAL HIGH (ref 6–23)
CO2: 21 mEq/L (ref 19–32)
CO2: 22 mEq/L (ref 19–32)
Calcium: 7.9 mg/dL — ABNORMAL LOW (ref 8.4–10.5)
Calcium: 8.8 mg/dL (ref 8.4–10.5)
Chloride: 90 mEq/L — ABNORMAL LOW (ref 96–112)
Chloride: 92 mEq/L — ABNORMAL LOW (ref 96–112)
Creatinine, Ser: 11.53 mg/dL — ABNORMAL HIGH (ref 0.50–1.35)
Creatinine, Ser: 11.7 mg/dL — ABNORMAL HIGH (ref 0.50–1.35)
GFR calc Af Amer: 5 mL/min — ABNORMAL LOW (ref >90)
GFR calc Af Amer: 5 mL/min — ABNORMAL LOW (ref >90)
GFR calc non Af Amer: 4 mL/min — ABNORMAL LOW (ref >90)
GFR calc non Af Amer: 4 mL/min — ABNORMAL LOW (ref >90)
Glucose, Bld: 65 mg/dL — ABNORMAL LOW (ref 70–99)
Glucose, Bld: 85 mg/dL (ref 70–99)
Potassium: 4.5 mEq/L (ref 3.5–5.1)
Potassium: 4.9 mEq/L (ref 3.5–5.1)
Sodium: 129 mEq/L — ABNORMAL LOW (ref 135–145)
Sodium: 130 mEq/L — ABNORMAL LOW (ref 135–145)

## 2012-04-14 LAB — COMPREHENSIVE METABOLIC PANEL
ALT: 25 U/L (ref 0–53)
AST: 32 U/L (ref 0–37)
Albumin: 2.6 g/dL — ABNORMAL LOW (ref 3.5–5.2)
Alkaline Phosphatase: 156 U/L — ABNORMAL HIGH (ref 39–117)
BUN: 85 mg/dL — ABNORMAL HIGH (ref 6–23)
CO2: 21 mEq/L (ref 19–32)
Calcium: 8.5 mg/dL (ref 8.4–10.5)
Chloride: 92 mEq/L — ABNORMAL LOW (ref 96–112)
Creatinine, Ser: 11.83 mg/dL — ABNORMAL HIGH (ref 0.50–1.35)
GFR calc Af Amer: 5 mL/min — ABNORMAL LOW (ref >90)
GFR calc non Af Amer: 4 mL/min — ABNORMAL LOW (ref >90)
Glucose, Bld: 62 mg/dL — ABNORMAL LOW (ref 70–99)
Potassium: 4.4 mEq/L (ref 3.5–5.1)
Sodium: 130 mEq/L — ABNORMAL LOW (ref 135–145)
Total Bilirubin: 0.8 mg/dL (ref 0.3–1.2)
Total Protein: 7.1 g/dL (ref 6.0–8.3)

## 2012-04-14 LAB — CBC
HCT: 26.6 % — ABNORMAL LOW (ref 39.0–52.0)
Hemoglobin: 9.6 g/dL — ABNORMAL LOW (ref 13.0–17.0)
MCH: 25.5 pg — ABNORMAL LOW (ref 26.0–34.0)
MCHC: 36.1 g/dL — ABNORMAL HIGH (ref 30.0–36.0)
MCV: 70.6 fL — ABNORMAL LOW (ref 78.0–100.0)
Platelets: 507 10*3/uL — ABNORMAL HIGH (ref 150–400)
RBC: 3.77 MIL/uL — ABNORMAL LOW (ref 4.22–5.81)
RDW: 19.2 % — ABNORMAL HIGH (ref 11.5–15.5)
WBC: 11.4 10*3/uL — ABNORMAL HIGH (ref 4.0–10.5)

## 2012-04-14 LAB — GLUCOSE, CAPILLARY
Glucose-Capillary: 57 mg/dL — ABNORMAL LOW (ref 70–99)
Glucose-Capillary: 99 mg/dL (ref 70–99)
Glucose-Capillary: 55 mg/dL — ABNORMAL LOW (ref 70–99)
Glucose-Capillary: 96 mg/dL (ref 70–99)
Glucose-Capillary: 80 mg/dL (ref 70–99)

## 2012-04-14 LAB — PROTIME-INR
INR: 1.25 (ref 0.00–1.49)
Prothrombin Time: 15.5 seconds — ABNORMAL HIGH (ref 11.6–15.2)

## 2012-04-14 LAB — SODIUM, URINE, RANDOM: Sodium, Ur: <10 mEq/L

## 2012-04-14 LAB — CREATININE, URINE, RANDOM: Creatinine, Urine: 419.36 mg/dL

## 2012-04-14 MED ORDER — PAREGORIC 2 MG/5ML PO TINC: 5.0000 mL | Freq: Two times a day (BID) | ORAL | Status: DC

## 2012-04-14 MED ORDER — LOPERAMIDE HCL 2 MG PO CAPS
2.0000 mg | ORAL_CAPSULE | Freq: Four times a day (QID) | ORAL | Status: DC
  Administered 2012-04-14 – 2012-04-22 (×32): 2 mg via ORAL
  Filled 2012-04-14 (×36): qty 1

## 2012-04-14 MED ORDER — WHITE PETROLATUM GEL
Status: AC
  Administered 2012-04-14: 04:00:00
  Filled 2012-04-14: qty 5

## 2012-04-14 MED ORDER — DEXTROSE 50 % IV SOLN
25.0000 mL | Freq: Once | INTRAVENOUS | Status: AC | PRN
  Administered 2012-04-14: 25 mL via INTRAVENOUS

## 2012-04-14 MED ORDER — BOOST / RESOURCE BREEZE PO LIQD
1.0000 | Freq: Three times a day (TID) | ORAL | Status: DC
  Administered 2012-04-14 – 2012-04-22 (×21): 1 via ORAL

## 2012-04-14 MED ORDER — DEXTROSE 50 % IV SOLN
INTRAVENOUS | Status: AC
  Administered 2012-04-14: 25 mL via INTRAVENOUS
  Filled 2012-04-14: qty 50

## 2012-04-14 NOTE — Progress Notes (Signed)
CBG 57 Pt NPO. Dr. Eulas Post on floor and notified. %0% Dextrose IV 25 ml given. Hypoglycemic orders iniatated. Pt voices no complaints

## 2012-04-14 NOTE — Progress Notes (Signed)
Inpatient Diabetes Program Recommendations  AACE/ADA: New Consensus Statement on Inpatient Glycemic Control (2013)  Target Ranges:  Prepandial:   less than 140 mg/dL      Peak postprandial:   less than 180 mg/dL (1-2 hours)      Critically ill patients:  140 - 180 mg/dL    Results for GENTLE, BOGLE (MRN [id number]) as of 04/14/2012 13:03  Ref. Range 04/14/2012 07:48 04/14/2012 08:45 04/14/2012 11:44 04/14/2012 12:06  Glucose-Capillary Latest Range: 70-99 mg/dL 57 (L) 99 55 (L) 96     Inpatient Diabetes Program Recommendations Insulin - Basal: Please consider placing Lantus on hold if patient continues to have hypoglycemia.  Note: Will follow. Wyn Quaker RN, MSN, CDE Diabetes Coordinator Inpatient Diabetes Program [phone]

## 2012-04-14 NOTE — Progress Notes (Signed)
Subjective: No complaints. Pt hungry and wants to eat. Denies N/V/abdominal pain/dizziness.  Objective: Vital signs in last 24 hours: Filed Vitals:   04/13/12 1714 04/13/12 1756 04/13/12 2055 04/14/12 0510  BP: 94/60 95/81 101/61 87/51  Pulse: 88 84 84 85  Temp:  98.6 F (37 C) 97.3 F (36.3 C) 97.6 F (36.4 C)  TempSrc:  Oral Oral Oral  Resp: 22 22 20 20   Height:  6' (1.829 m)    Weight:  196 lb 10.4 oz (89.2 kg) 196 lb (88.905 kg)   SpO2: 100% 100% 100% 100%   Weight change:   Intake/Output Summary (Last 24 hours) at 04/14/12 0836 Last data filed at 04/14/12 0615  Gross per 24 hour  Intake 1195.83 ml  Output   1660 ml  Net -464.17 ml   Vitals reviewed. General: Lying comfortably in bed, NAD HEENT: PERRL, EOMI, no scleral icterus Cardiac: RRR, no rubs, murmurs or gallops Pulm: Clear to auscultation bilaterally, no wheezes, rales, or rhonchi Abd: Soft, nontender, nondistended, BS present, abd wound healing nicely Ext: Warm and well perfused, no pedal edema Neuro: Alert and oriented X3, cranial nerves II-XII grossly intact, strength and sensation to light touch equal in bilateral upper and lower extremities Lab Results: Basic Metabolic Panel:  Lab [id number] 0610 04/13/12 2344  NA 130* 130*  K 4.4 4.9  CL 92* 92*  CO2 21 21  GLUCOSE 62* 65*  BUN 85* 84*  CREATININE 11.83* 11.53*  CALCIUM 8.5 8.8  MG -- --  PHOS -- --   Liver Function Tests:  Lab 04/14/12 0610 04/13/12 1332  AST 32 36  ALT 25 31  ALKPHOS 156* 192*  BILITOT 0.8 0.8  PROT 7.1 8.3  ALBUMIN 2.6* 2.9*    Lab 04/13/12 1332  LIPASE 55  AMYLASE --   CBC:  Lab 04/14/12 0610 04/13/12 1332  WBC 11.4* 13.0*  NEUTROABS -- 7.3  HGB 9.6* 11.1*  HCT 26.6* 32.0*  MCV 70.6* 71.1*  PLT 507* 562*   Cardiac Enzymes:  Lab 04/13/12 1332  CKTOTAL --  CKMB --  CKMBINDEX --  TROPONINI <0.30   CBG:  Lab 04/14/12 0748 04/13/12 2312  GLUCAP 57* 141*   Coagulation:  Lab 04/14/12 0610 04/13/12  1332  LABPROT 15.5* 14.6  INR 1.25 1.16   Urinalysis:  Lab 04/13/12 1547  COLORURINE AMBER*  LABSPEC 1.029  PHURINE 5.0  GLUCOSEU NEGATIVE  HGBUR NEGATIVE  BILIRUBINUR SMALL*  KETONESUR 15*  PROTEINUR >300*  UROBILINOGEN 1.0  NITRITE NEGATIVE  LEUKOCYTESUR SMALL*    Micro Results: Recent Results (from the past 240 hour(s))  CLOSTRIDIUM DIFFICILE BY PCR     Status: Normal   Collection Time   04/13/12  4:09 PM      Component Value Range Status Comment   C difficile by pcr NEGATIVE  NEGATIVE Final    Studies/Results: Dg Chest 2 View  04/13/2012  *RADIOLOGY REPORT*  Clinical Data: Chest pain  CHEST - 2 VIEW  Comparison: 03/02/2012  Findings: Normal heart size.  Clear lungs.  No pneumothorax.  No pleural effusion.  IMPRESSION: No active cardiopulmonary disease.   Original Report Authenticated By: Marybelle Killings, M.D.    US Renal  04/13/2012  *RADIOLOGY REPORT*  Clinical Data:  Acute renal failure  RENAL/URINARY TRACT ULTRASOUND COMPLETE  Comparison:  None.  Findings:  Right Kidney:  Right renal cysts noted. The largest is in the upper pole measuring 2.6 x 1.9 x 2.2 cm.  Normal in size and parenchymal echogenicity.  No evidence of mass or hydronephrosis.  Left Kidney:  Upper pole left renal cyst measures 2.2 x 1.6 x 3.0 cm.  Normal in size and parenchymal echogenicity.  No evidence of mass or hydronephrosis.  Bladder:  Appears normal for degree of bladder distention.  IMPRESSION: 1.  Bilateral renal cyst. 2.  No obstructive uropathy.   Original Report Authenticated By: Kerby Moors, M.D.    Medications: I have reviewed the patient's current medications. Scheduled Meds:   . atorvastatin  20 mg Oral QHS  . heparin  5,000 Units Subcutaneous Q8H  . insulin aspart  0-9 Units Subcutaneous TID WC  . insulin glargine  10 Units Subcutaneous QHS  . levofloxacin  250 mg Oral Daily  . [COMPLETED] sodium chloride  1,000 mL Intravenous Once  . sodium chloride  3 mL Intravenous Q12H  .  [COMPLETED] sodium polystyrene  15 g Oral Once  . [COMPLETED] white petrolatum       Continuous Infusions:   .  sodium bicarbonate infusion 1000 mL 250 mL/hr at 04/13/12 2334   And  .  sodium bicarbonate infusion 1000 mL 120 mL/hr at 04/14/12 0615  . [DISCONTINUED] sodium chloride 150 mL (04/13/12 1814)  . [DISCONTINUED]  sodium bicarbonate infusion 1000 mL     PRN Meds:.acetaminophen, acetaminophen, [COMPLETED] dextrose, ondansetron (ZOFRAN) IV, [DISCONTINUED] loperamide  Assessment/Plan: Mr. Stober is a 61 yo gentleman with a history of type 2 diabetes on insulin, invasive adenocarcinoma of colon s/p right hemicolectomy complicated by postop intra-abdominal abscess with a leaking anastamosis, OSA, hypertension, who presents with nausea, vomiting, and dehydration resulting in reoccurrence of acute renal failure.   1. Acute renal failure: Recurrent. On admission, creatinine was 11.2 and BUN 84. Likely secondary to extreme volume depletion in the setting of nausea, vomiting, increased ileostomy output. In his previous admission, his BUN was 156 and creatinine 17. Bicarbonate was 16 and potassium 6.5. It was likely related to his IV contrast used for abdominal CT at last admission. He was treated with IV fluids and left the hospital on 04/05/12 with a creatinine of 3.6. Of note, the patient's creatinine was 1.0 on 03/09/12. On this admission, the patient was a severely volume depleted and acidotic with an anion gap of 18.  Metabolic acidosis likely due to renal failure secondary to dehydration. Renal was consulted, Dr. Hassell Done evaluated the patient and suggested treating with volume resuscitation and alkalai therapy. Appreciate Renal evaluation and recommendations. Per Renal recs, he was started on 1/2 NS with 75 mEq bicarbonate at 250 cc an hour x 8 hours then 120 cc per hour. His AG this morning was still elevated at 17. - Continue 1/2NS w/ 11mEq bicarb @ 120cc/hr  - Checking FeNa - am BMP to follow gap   - Strict I and O's, daily weights  - Zofran PRN  - Consider Renal ultrasound  - Follow BMP  - F/u with Renal  2. Hyperkalemia: Likely 2/2 copious diarrhea since discharge. K was 5.4 on admission without EKG changes. Patient received 1 dose of Kayexalate, 15 g in ED. On repeat BMP, potassium 4.9, and 4.4 this morning. Will continue to monitor. - am BMP   3. 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 S/p prior wound vac in place which was removed on 03/10/12. His wound was healing nicely at previous d/c,  and continues to heal well.  At this admission, his ileostomy site appears clear, with liquid output. He previous CT scan abd/pelvis showed inflammation of omental fat, no focal abscess noted, no SBO. Pt with abdominal pain to deep palpation around the incision, no rebound or guarding. Does not appear septic. He had a f/u appt with Surgery, Dr. [name] today. I contacted his office to notify Dr. [name] that Mr. Dorow was readmitted to the hospital.  - Ileostomy care - Follow CBC, vitals    4. Nausea, vomiting and diarrhea: Patient has been on Levaquin recently, which puts patient at risk of getting C. difficile colitis. Patient had elevated lipase in the past,143 on 03/29/12. Lipase 55 on admission. C diff PCR negative.  - Continue Zofran for nausea.  - Restart renal diet  5. Leukocytosis: WBC 13 on admission. Hx of UTI during last admission but pt only received 4 doses of 14 of Levaquin therapy b/c he couldn't afford the medication after discharge. Patient specifically denies dysuria, fever, chills. Still has a leukocytosis, may be from uncleared UTI, stress reaction, AKI. Urine cx grew pan sensitive E. coli at last admission. Checking C. Diff, stool cx, and urine cx.  Continue Levaquin to treat as complicated UTI in a male. - Continue Levaquin - am CBC  6. DM-II: Last A1c was 6.2 at 03/02/12. Patient has been on Metformin and Novolin 70/30 at home, although he was not discharged from the hospital on these medications.  - Holding metformin as pt with metabolic acidosis and renal failure  - Lantus 10u daily with correction scale.  - SSI.   7. HTN: Patient blood pressure is running low. Blood pressure is 92/59 on admission.  - Continue IVF   8. PPX:  Heparin     LOS: 1 day   Otho Bellows 04/14/2012, 8:36 AM

## 2012-04-14 NOTE — Progress Notes (Signed)
Patient ID: [name], male   DOB: 04-21-1951, 61 y.o.   MRN: [id number] Medical Student Daily Progress Note   Subjective:    Interval Events:   Pt was hypotensive at 5 AM this morning (87/51).  At 0700 this morning he also had a CBG of 55 and during this morning's interview the nurse gave him a bolus 50% dextrose.  Yesterday he was complaining of dizziness upon change in position (lying to standing).  This morning he no longer complains of dizziness.  Denies fever, chills, HA, vision changes, SOB, chest pain, abdominal pain, weakness/numbness in extremities.    Objective:    Vital Signs:   Temp:  [97.3 F (36.3 C)-98.6 F (37 C)] 97.6 F (36.4 C) (11/12 0510) Pulse Rate:  [81-88] 85  (11/12 0510) Resp:  [20-26] 20  (11/12 0510) BP: (84-101)/(51-81) 87/51 mmHg (11/12 0510) SpO2:  [97 %-100 %] 100 % (11/12 0510) Weight:  [88.905 kg (196 lb)-89.2 kg (196 lb 10.4 oz)] 88.905 kg (196 lb) (11/11 2055)     Weights: 24-hour Weight change:   Filed Weights   04/13/12 1756 04/13/12 2055  Weight: 89.2 kg (196 lb 10.4 oz) 88.905 kg (196 lb)   Net since admission: -0.7   Intake/Output:   Intake/Output Summary (Last 24 hours) at 04/14/12 0825 Last data filed at 04/14/12 0615  Gross per 24 hour  Intake 1195.83 ml  Output   1660 ml  Net -464.17 ml     Net since admission:  -431  Physical Exam: GENERAL:  alert and oriented; resting comfortably in bed and in no distress EYES:  pupils equal, round, and reactive to light; sclera anicteric, fundoscopic exam revealed no gross abnormalities ENT:  moist mucosa, no upper dentition, multiple caries in bottom teeth NECK:  no JVD, no thyromegaly, no lymphadenopathy LUNGS:  clear to auscultation bilaterally, normal work of breathing HEART:  normal rate; regular rhythm; normal S1 and S2, no S3 or S4 appreciated; no murmurs, rubs, or clicks ABDOMEN:  soft, non-tender, normal bowel sounds, no masses palpated, ostomy noted in RLQ, midline  vertical incision appreciated with good granulation tissue and no purulent drainage EXTREMITIES:  No peripheral edema, dry skin noted on shins bilaterally, 2+ pulses bilaterally (radial, dorsalis pedis) GENITALS: foley catheter in place, clean no drainage NEURO: EOMI, symmetric smile, palate rises symmetrically, tongue protrudes to midline, sensation to light touch intact throughout. SKIN:  normal turgor   Labs: Basic Metabolic Panel:  Lab [id number] 0610 04/13/12 2344 04/13/12 1332  NA 130* 130* 129*  K 4.4 4.9 5.4*  CL 92* 92* 92*  CO2 21 21 19   GLUCOSE 62* 65* 84  BUN 85* 84* 84*  CREATININE 11.83* 11.53* 11.24*  CALCIUM 8.5 8.8 9.5  MG -- -- --  PHOS -- -- --    Liver Function Tests:  Lab 04/14/12 0610 04/13/12 1332  AST 32 36  ALT 25 31  ALKPHOS 156* 192*  BILITOT 0.8 0.8  PROT 7.1 8.3  ALBUMIN 2.6* 2.9*    CBC:  Lab 04/14/12 0610 04/13/12 1332  WBC 11.4* 13.0*  NEUTROABS -- 7.3  HGB 9.6* 11.1*  HCT 26.6* 32.0*  MCV 70.6* 71.1*  PLT 507* 562*    Cardiac Enzymes:  Lab 04/13/12 1332  CKTOTAL --  CKMB --  CKMBINDEX --  TROPONINI <0.30    BNP (last 3 results):  Basename 02/21/12 0839  PROBNP 295.9*    CBG:  Lab 04/14/12 0748 04/13/12 2312  GLUCAP 57* 141*  Coagulation Studies:  Basename 04/14/12 0610 2012/05/07 1332  LABPROT 15.5* 14.6  INR 1.25 1.16    Microbiology: Results for orders placed during the hospital encounter of 05/07/2012  CLOSTRIDIUM DIFFICILE BY PCR     Status: Normal   Collection Time   2012/05/07  4:09 PM      Component Value Range Status Comment   C difficile by pcr NEGATIVE  NEGATIVE Final    Imaging: Dg Chest 2 View  May 07, 2012  *RADIOLOGY REPORT*  Clinical Data: Chest pain  CHEST - 2 VIEW  Comparison: 03/02/2012  Findings: Normal heart size.  Clear lungs.  No pneumothorax.  No pleural effusion.  IMPRESSION: No active cardiopulmonary disease.   Original Report Authenticated By: Marybelle Killings, M.D.    US  Renal  IMPRESSION: 1.  Bilateral renal cyst. 2.  No obstructive uropathy.   Original Report Authenticated By: Kerby Moors, M.D.       Medications:    Infusions:    .  sodium bicarbonate infusion 1000 mL 250 mL/hr at 05/07/2012 2334   And  .  sodium bicarbonate infusion 1000 mL 120 mL/hr at 04/14/12 0615  . [DISCONTINUED] sodium chloride 150 mL (05/07/12 1814)  . [DISCONTINUED]  sodium bicarbonate infusion 1000 mL       Scheduled Medications:    . atorvastatin  20 mg Oral QHS  . heparin  5,000 Units Subcutaneous Q8H  . insulin aspart  0-9 Units Subcutaneous TID WC  . insulin glargine  10 Units Subcutaneous QHS  . levofloxacin  250 mg Oral Daily  . [COMPLETED] sodium chloride  1,000 mL Intravenous Once  . sodium chloride  3 mL Intravenous Q12H  . [COMPLETED] sodium polystyrene  15 g Oral Once  . [COMPLETED] white petrolatum         PRN Medications: acetaminophen, acetaminophen, [COMPLETED] dextrose, ondansetron (ZOFRAN) IV, [DISCONTINUED] loperamide    Assessment/ Plan:    [name] is a 61 yo gentleman with a history of type 2 diabetes on insulin, invasive adenocarcinoma of colon s/p right hemicolectomy complicated by postop intra-abdominal abscess with a leaking anastamosis, OSA, hypertension, who presents with nausea and vomiting on HD#1.   1.  Acute renal failure - recurrent. Likely secondary to extreme volume depletion in the setting of nausea, vomiting, increased ileostomy output. Creatinine is 11.83 and BUN is 85 today. AG 17. FEna <1 kidney failure likely prerenal due to hypovolemia. - Appreciate renal consultation is in managing our patient.  - Will treat with 1/2 NS with 75 mEq bicarbonate at 120 cc per hour per Dr. Marval Regal.  Per Renal pt's acidotic state and renal failure could be a combination of hypovolemia and his medications could have contributed by causing an ATN - Strict I and O's, daily weights  - Zofran PRN  - US showed no obstruction, showed  bilateral cysts - Follow BMP for gap closure  2.  Hyperkalemia: K now 4.4 -will treat IV fluids.  -will repeat BMP   3.  Colon adenocarcinoma S/p right hemicolectomy and ileostomy creation. Elevated WBC to 13.0, but afebrile. He had hx of abdominal abscess 2/2 leaking anastamosis. Today, ileostomy site appears clear, +output. He previous CT scan abd/pelvis showed inflammation of omental fat, no focal abscess noted, no SBO. Pt with abdominal pain around incision, no rebound or guarding. Does not appear septic.  - Ileostomy care - Follow CBC, vitals  - Consider to consult surgery, if condition worse.   4.  Nausea, vomiting and diarrhea: .  - C  diff negative.  - Lipase normal  - Will treat with Zofran for nausea.    5.  Leukocytosis: WBC 11.4 today. Hx of UTI during last admission and pt delayed in getting levaquin therapy. Patient specifically denies dysuria, fever, chills. Still has a leukocytosis, may be from uncleared UTI, stress reaction, AKI. Urine cx grew pan sensitive E. coli on last admission -will cont levaquin to treat as complicated UTI (14 day course, 11/12 is day 1)  6.  DM-II: Last A1c was 6.2 at 03/02/12. Patient has been on metformin and Novolin 70/30 at home. He doesn't regularly check his sugar but just takes medications. - hold metformin (pt is acidotic) - Lantus 10u daily with correction scale.  - SSI.   7.  HTN: Patient blood pressure is running low. Blood pressure is 92/59 on admission.  Has remained low [id number] systolic [id number] diastolic - Will hold home medication (Diovan) due to low Bp and renal failure.  - Will treat with IVF   8.  PPX: heparin [facility]   9. Diet: - Renal diet  10. Dispo: - Pt is still adamant about not wanting to go back to a SNF (specifically [facility]), it was explained to him that his only other option in terms of SNF's was outside the county, he was averse to this as well. - Social work is aware of him and his situation - Pt did not receive  adequate care at home, would not feel comfortable dc'ing him to home upon stabilization.      Length of Stay: 1 days   This is a Careers information officer Note.  The care of the patient was discussed with Dr. Eulas Post and the assessment and plan formulated with their assistance.  Please see their attached note or addendum for official documentation of the daily encounter.

## 2012-04-14 NOTE — Consult Note (Signed)
Reason for Consult:Readmitted for volume depletion/ renal failure/ high ileostomy output Referring Physician: Virgel Gess Rhatigan is an 61 y.o. male.  HPI: This patient has had a complicated recent surgical and medical history.  He underwent exploratory laparotomy and right hemicolectomy on 02/06/12 for a T3N0 adenocarcinoma of the right colon.  He had some renal dysfunction during his immediate postoperative phase. He was discharged home a week later tolerating a regular diet with a normal creatinine. However, he presented several days later in septic shock and return to the operating room for exploratory laparotomy. This found to have a leak at his anastomosis. 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 He was managed with an open abdomen for several days and his abdomen was finally closed on 02/26/12. He was readmitted in late October in acute renal failure due to high ileostomy output.He had been residing at a skilled nursing facility and was supposed to be using Imodium to slow down his ileostomy output. However it appears that this did not happen. He was discharged about a week ago to home with home health nursing. He was readmitted on 04/13/12 with volume depletion and renal failure. He continues to have very thin ileostomy output. He recorded output on 04/13/12 was about 1.5 L. He also presented with some nausea and vomiting but this may be secondary to the dehydration and renal failure. He is clearly not obstructed.  Past Medical History  Diagnosis Date  . Hyperlipidemia   . Hypertension   . Heart murmur   . Diabetes mellitus     type 2 IDDM x 15 years  . Sleep apnea     does not wear CPAP now  . Colon polyp   . Arthritis     left knee  . 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 12/25/2011    Past Surgical History  Procedure Date  . Colonoscopy   . Ganglion cyst excision 20 YRS AGO    RT ARM  . Partial  colectomy 02/06/2012  . Partial colectomy 02/06/2012    Procedure: PARTIAL COLECTOMY;  Surgeon: Imogene Burn. [name], MD;  Location: [facility];  Service: General;  Laterality: N/A;  right partial colectomy  . Colonoscopy 02/06/2012    Procedure: COLONOSCOPY;  Surgeon: Milus Banister, MD;  Location: [facility];  Service: Endoscopy;;  . Laparotomy 02/16/2012    Procedure: EXPLORATORY LAPAROTOMY;  Surgeon: Zenovia Jarred, MD;  Location: [facility];  Service: General;  Laterality: N/A;  Exploratory Laparotomy with resection of anastomosis  . Ileostomy 02/16/2012    Procedure: ILEOSTOMY;  Surgeon: Zenovia Jarred, MD;  Location: [place];  Service: General;  Laterality: N/A;  . Application of wound vac 02/16/2012    Procedure: APPLICATION OF WOUND VAC;  Surgeon: Zenovia Jarred, MD;  Location: [facility];  Service: General;  Laterality: N/A;  . Laparotomy 02/18/2012    Procedure: EXPLORATORY LAPAROTOMY;  Surgeon: Odis Hollingshead, MD;  Location: [facility];  Service: General;  Laterality: N/A;  exploratory laparotomy  change of abdominal vac dressing  . Dressing change under anesthesia 02/18/2012    Procedure: DRESSING CHANGE UNDER ANESTHESIA;  Surgeon: Odis Hollingshead, MD;  Location: [facility];  Service: General;  Laterality: N/A;  . Laparotomy 02/20/2012    Procedure: EXPLORATORY LAPAROTOMY;  Surgeon: Odis Hollingshead, MD;  Location: [facility];  Service: General;  Laterality: N/A;  . Vacuum assisted closure change 02/20/2012  Procedure: ABDOMINAL VACUUM ASSISTED CLOSURE CHANGE;  Surgeon: Odis Hollingshead, MD;  Location: [facility];  Service: General;;  . Laparotomy 02/23/2012    Procedure: EXPLORATORY LAPAROTOMY;  Surgeon: Joyice Faster. Cornett, MD;  Location: [facility];  Service: General;  Laterality: N/A;  Irrigation and Debridement of abdominal wound with wound vac change with partial closure  . Incision and drainage of wound 02/23/2012    Procedure: IRRIGATION AND DEBRIDEMENT WOUND;  Surgeon: Joyice Faster. Cornett, MD;  Location: [facility];  Service:  General;  Laterality: N/A;  . Vacuum assisted closure change 02/23/2012    Procedure: ABDOMINAL VACUUM ASSISTED CLOSURE CHANGE;  Surgeon: Joyice Faster. Cornett, MD;  Location: [facility];  Service: General;  Laterality: N/A;  . Laparotomy 02/26/2012    Procedure: EXPLORATORY LAPAROTOMY;  Surgeon: Imogene Burn. [name], MD;  Location: [facility];  Service: General;  Laterality: N/A;     . Application of wound vac 02/26/2012    Procedure: APPLICATION OF WOUND VAC;  Surgeon: Imogene Burn. [name], MD;  Location: [facility];  Service: General;  Laterality: N/A;    Family History  Problem Relation Age of Onset  . Diabetes Father   . Heart disease Father   . Colon cancer Neg Hx   . Diabetes Sister   . Heart disease Sister   . Heart disease Mother     Social History:  reports that he has been smoking Cigarettes.  He has a 15 pack-year smoking history. He has never used smokeless tobacco. He reports that he drinks alcohol. He reports that he does not use illicit drugs.  Allergies: No Known Allergies  Medications:  Scheduled:   . atorvastatin  20 mg Oral QHS  . feeding supplement  1 Container Oral TID BM  . heparin  5,000 Units Subcutaneous Q8H  . insulin aspart  0-9 Units Subcutaneous TID WC  . insulin glargine  10 Units Subcutaneous QHS  . levofloxacin  250 mg Oral Daily  . loperamide  2 mg Oral QID  . sodium chloride  3 mL Intravenous Q12H  . [COMPLETED] white petrolatum        Results for orders placed during the hospital encounter of 04/13/12 (from the past 48 hour(s))  CBC WITH DIFFERENTIAL     Status: Abnormal   Collection Time   04/13/12  1:32 PM      Component Value Range Comment   WBC 13.0 (*) 4.0 - 10.5 K/uL    RBC 4.50  4.22 - 5.81 MIL/uL    Hemoglobin 11.1 (*) 13.0 - 17.0 g/dL    HCT 32.0 (*) 39.0 - 52.0 %    MCV 71.1 (*) 78.0 - 100.0 fL    MCH 24.7 (*) 26.0 - 34.0 pg    MCHC 34.7  30.0 - 36.0 g/dL    RDW 19.0 (*) 11.5 - 15.5 %    Platelets 562 (*) 150 - 400 K/uL    Neutrophils Relative 57   43 - 77 %    Neutro Abs 7.3  1.7 - 7.7 K/uL    Lymphocytes Relative 37  12 - 46 %    Lymphs Abs 4.8 (*) 0.7 - 4.0 K/uL    Monocytes Relative 6  3 - 12 %    Monocytes Absolute 0.7  0.1 - 1.0 K/uL    Eosinophils Relative 1  0 - 5 %    Eosinophils Absolute 0.1  0.0 - 0.7 K/uL    Basophils Relative 0  0 - 1 %  Basophils Absolute 0.0  0.0 - 0.1 K/uL   COMPREHENSIVE METABOLIC PANEL     Status: Abnormal   Collection Time   04/13/12  1:32 PM      Component Value Range Comment   Sodium 129 (*) 135 - 145 mEq/L    Potassium 5.4 (*) 3.5 - 5.1 mEq/L    Chloride 92 (*) 96 - 112 mEq/L    CO2 19  19 - 32 mEq/L    Glucose, Bld 84  70 - 99 mg/dL    BUN 84 (*) 6 - 23 mg/dL    Creatinine, Ser 11.24 (*) 0.50 - 1.35 mg/dL    Calcium 9.5  8.4 - 10.5 mg/dL    Total Protein 8.3  6.0 - 8.3 g/dL    Albumin 2.9 (*) 3.5 - 5.2 g/dL    AST 36  0 - 37 U/L    ALT 31  0 - 53 U/L    Alkaline Phosphatase 192 (*) 39 - 117 U/L    Total Bilirubin 0.8  0.3 - 1.2 mg/dL    GFR calc non Af Amer 4 (*) >90 mL/min    GFR calc Af Amer 5 (*) >90 mL/min   TROPONIN I     Status: Normal   Collection Time   04/13/12  1:32 PM      Component Value Range Comment   Troponin I <0.30  <0.30 ng/mL   PROTIME-INR     Status: Normal   Collection Time   04/13/12  1:32 PM      Component Value Range Comment   Prothrombin Time 14.6  11.6 - 15.2 seconds    INR 1.16  0.00 - 1.49   LIPASE, BLOOD     Status: Normal   Collection Time   04/13/12  1:32 PM      Component Value Range Comment   Lipase 55  11 - 59 U/L   URINALYSIS, MICROSCOPIC ONLY     Status: Abnormal   Collection Time   04/13/12  3:47 PM      Component Value Range Comment   Color, Urine AMBER (*) YELLOW BIOCHEMICALS MAY BE AFFECTED BY COLOR   APPearance TURBID (*) CLEAR    Specific Gravity, Urine 1.029  1.005 - 1.030    pH 5.0  5.0 - 8.0    Glucose, UA NEGATIVE  NEGATIVE mg/dL    Hgb urine dipstick NEGATIVE  NEGATIVE    Bilirubin Urine SMALL (*) NEGATIVE     Ketones, ur 15 (*) NEGATIVE mg/dL    Protein, ur >300 (*) NEGATIVE mg/dL    Urobilinogen, UA 1.0  0.0 - 1.0 mg/dL    Nitrite NEGATIVE  NEGATIVE    Leukocytes, UA SMALL (*) NEGATIVE    WBC, UA 0-2  <3 WBC/hpf    Bacteria, UA FEW (*) RARE    Squamous Epithelial / LPF MANY (*) RARE    Urine-Other AMORPHOUS URATES/PHOSPHATES     URINE CULTURE     Status: Normal   Collection Time   04/13/12  3:47 PM      Component Value Range Comment   Specimen Description URINE, CLEAN CATCH      Special Requests NONE      Culture  Setup Time 04/13/2012 16:47      Colony Count NO GROWTH      Culture NO GROWTH      Report Status 04/14/2012 FINAL     CLOSTRIDIUM DIFFICILE BY PCR     Status: Normal   Collection  Time   04/13/12  4:09 PM      Component Value Range Comment   C difficile by pcr NEGATIVE  NEGATIVE   GLUCOSE, CAPILLARY     Status: Abnormal   Collection Time   04/13/12 11:12 PM      Component Value Range Comment   Glucose-Capillary 141 (*) 70 - 99 mg/dL   BASIC METABOLIC PANEL     Status: Abnormal   Collection Time   04/13/12 11:44 PM      Component Value Range Comment   Sodium 130 (*) 135 - 145 mEq/L    Potassium 4.9  3.5 - 5.1 mEq/L    Chloride 92 (*) 96 - 112 mEq/L    CO2 21  19 - 32 mEq/L    Glucose, Bld 65 (*) 70 - 99 mg/dL    BUN 84 (*) 6 - 23 mg/dL    Creatinine, Ser 11.53 (*) 0.50 - 1.35 mg/dL    Calcium 8.8  8.4 - 10.5 mg/dL    GFR calc non Af Amer 4 (*) >90 mL/min    GFR calc Af Amer 5 (*) >90 mL/min   CBC     Status: Abnormal   Collection Time   04/14/12  6:10 AM      Component Value Range Comment   WBC 11.4 (*) 4.0 - 10.5 K/uL    RBC 3.77 (*) 4.22 - 5.81 MIL/uL    Hemoglobin 9.6 (*) 13.0 - 17.0 g/dL    HCT 26.6 (*) 39.0 - 52.0 %    MCV 70.6 (*) 78.0 - 100.0 fL    MCH 25.5 (*) 26.0 - 34.0 pg    MCHC 36.1 (*) 30.0 - 36.0 g/dL    RDW 19.2 (*) 11.5 - 15.5 %    Platelets 507 (*) 150 - 400 K/uL   COMPREHENSIVE METABOLIC PANEL     Status: Abnormal   Collection Time    04/14/12  6:10 AM      Component Value Range Comment   Sodium 130 (*) 135 - 145 mEq/L    Potassium 4.4  3.5 - 5.1 mEq/L    Chloride 92 (*) 96 - 112 mEq/L    CO2 21  19 - 32 mEq/L    Glucose, Bld 62 (*) 70 - 99 mg/dL    BUN 85 (*) 6 - 23 mg/dL    Creatinine, Ser 11.83 (*) 0.50 - 1.35 mg/dL    Calcium 8.5  8.4 - 10.5 mg/dL    Total Protein 7.1  6.0 - 8.3 g/dL    Albumin 2.6 (*) 3.5 - 5.2 g/dL    AST 32  0 - 37 U/L    ALT 25  0 - 53 U/L    Alkaline Phosphatase 156 (*) 39 - 117 U/L    Total Bilirubin 0.8  0.3 - 1.2 mg/dL    GFR calc non Af Amer 4 (*) >90 mL/min    GFR calc Af Amer 5 (*) >90 mL/min   PROTIME-INR     Status: Abnormal   Collection Time   04/14/12  6:10 AM      Component Value Range Comment   Prothrombin Time 15.5 (*) 11.6 - 15.2 seconds    INR 1.25  0.00 - 1.49   GLUCOSE, CAPILLARY     Status: Abnormal   Collection Time   04/14/12  7:48 AM      Component Value Range Comment   Glucose-Capillary 57 (*) 70 - 99 mg/dL   GLUCOSE, CAPILLARY  Status: Normal   Collection Time   04/14/12  8:45 AM      Component Value Range Comment   Glucose-Capillary 99  70 - 99 mg/dL   SODIUM, URINE, RANDOM     Status: Normal   Collection Time   04/14/12 11:00 AM      Component Value Range Comment   Sodium, Ur <10   RESULTS CONFIRMED BY MANUAL DILUTION  CREATININE, URINE, RANDOM     Status: Normal   Collection Time   04/14/12 11:00 AM      Component Value Range Comment   Creatinine, Urine 419.36     GLUCOSE, CAPILLARY     Status: Abnormal   Collection Time   04/14/12 11:44 AM      Component Value Range Comment   Glucose-Capillary 55 (*) 70 - 99 mg/dL   GLUCOSE, CAPILLARY     Status: Normal   Collection Time   04/14/12 12:06 PM      Component Value Range Comment   Glucose-Capillary 96  70 - 99 mg/dL   GLUCOSE, CAPILLARY     Status: Normal   Collection Time   04/14/12  4:16 PM      Component Value Range Comment   Glucose-Capillary 80  70 - 99 mg/dL   BASIC METABOLIC PANEL      Status: Abnormal   Collection Time   04/14/12  8:27 PM      Component Value Range Comment   Sodium 129 (*) 135 - 145 mEq/L    Potassium 4.5  3.5 - 5.1 mEq/L    Chloride 90 (*) 96 - 112 mEq/L    CO2 22  19 - 32 mEq/L    Glucose, Bld 85  70 - 99 mg/dL    BUN 87 (*) 6 - 23 mg/dL    Creatinine, Ser 11.70 (*) 0.50 - 1.35 mg/dL    Calcium 7.9 (*) 8.4 - 10.5 mg/dL    GFR calc non Af Amer 4 (*) >90 mL/min    GFR calc Af Amer 5 (*) >90 mL/min     Dg Chest 2 View  04/13/2012  *RADIOLOGY REPORT*  Clinical Data: Chest pain  CHEST - 2 VIEW  Comparison: 03/02/2012  Findings: Normal heart size.  Clear lungs.  No pneumothorax.  No pleural effusion.  IMPRESSION: No active cardiopulmonary disease.   Original Report Authenticated By: Marybelle Killings, M.D.    US Renal  04/13/2012  *RADIOLOGY REPORT*  Clinical Data:  Acute renal failure  RENAL/URINARY TRACT ULTRASOUND COMPLETE  Comparison:  None.  Findings:  Right Kidney:  Right renal cysts noted. The largest is in the upper pole measuring 2.6 x 1.9 x 2.2 cm.  Normal in size and parenchymal echogenicity.  No evidence of mass or hydronephrosis.  Left Kidney:  Upper pole left renal cyst measures 2.2 x 1.6 x 3.0 cm.  Normal in size and parenchymal echogenicity.  No evidence of mass or hydronephrosis.  Bladder:  Appears normal for degree of bladder distention.  IMPRESSION: 1.  Bilateral renal cyst. 2.  No obstructive uropathy.   Original Report Authenticated By: Kerby Moors, M.D.     Review of Systems  Eyes: Negative.   Neurological: Negative.   Endo/Heme/Allergies: Negative.    Blood pressure 90/55, pulse 82, temperature 98.6 F (37 C), temperature source Oral, resp. rate 20, height 6' (1.829 m), weight 196 lb (88.905 kg), SpO2 98.00%. Physical Exam WDWN in NAD - appears thinner than previous Awake, alert Abd - soft, non-tender Wound -  smaller; well-granulated; some exposed sutures; minimal fibrinous exudate Ileostomy - brown thin output;  viable  Assessment/Plan: 1.  Acute renal failure secondary to volume depletion 2.  High ileostomy output resulting in volume depletion  Recs:  Would consult GI - [name] to help manage his high ileostomy output He is not a candidate for surgical reversal of his ileostomy for several months.  If Imodium is not successful in slowing his ileostomy output, perhaps adding Paregoric (tincture of opium) or somatostatin would be more successful in decreasing his output.  Would leave this up to Dr. Kelby Fam recommendations.  Also, consider PICC line placement and regular IV hydration with home health when his renal function improves and he is discharged home  Wound care - continue daily wet to dry dressings    Imogene Burn. [name], MD, [facility] Surgery  04/15/2012 12:02 AM

## 2012-04-14 NOTE — Progress Notes (Signed)
Hypoglycemic Event  CBG: 55  Treatment: 15 GM carbohydrate snack and D50 IV 25 mL  Symptoms: None  Follow-up CBG: Time 1207 CBG Result:99  Possible Reasons for Event: Other: NPO  Comments/MD notified:yes    [name]  Remember to initiate Hypoglycemia Order Set & complete

## 2012-04-14 NOTE — H&P (Signed)
Internal Medicine Attending Admission Note Date: 04/14/2012  Patient name: [name] Medical record number: [id number] Date of birth: 04-07-1951 Age: 61 y.o. Gender: male  I saw and evaluated the patient. I reviewed the resident's note and I agree with the resident's findings and plan as documented in the resident's note, with the following additional comments.  Chief Complaint(s): Nausea/vomiting, large amount of ileostomy output   History - key components related to admission: Patient is a 61 year old man with history of adenocarcinoma of the colon, status post right hemicolectomy on [id number] complicated by anastomotic leak with intra-abdominal abscess,  status post exploratory laparotomy and resection of ileocolonic anastomosis with ileostomy on 02/16/2012, recently discharged 04/06/2012 from our service following an admission with acute renal failure and urinary tract infection, now admitted with nausea, vomiting, a large amount of ileostomy output, poor oral intake, and oliguria.   Physical Exam - key components related to admission:  Filed Vitals:   04/13/12 2055 04/14/12 0510 04/14/12 0934 04/14/12 0937  BP: 101/61 87/51 85/50  89/56  Pulse: 84 85 80 86  Temp: 97.3 F (36.3 C) 97.6 F (36.4 C)  98.7 F (37.1 C)  TempSrc: Oral Oral  Oral  Resp: 20 20  20   Height:      Weight: 196 lb (88.905 kg)     SpO2: 100% 100%  100%   General: Alert, oriented Lungs: Clear Heart: Regular; no extra sounds or murmurs Abdomen: Bowel sounds present, soft, nontender; abdominal wound in mid abdomen it is clean without evidence of infection Extremities: No edema  Lab results:   Basic Metabolic Panel:  Basename 04/14/12 0610 04/13/12 2344  NA 130* 130*  K 4.4 4.9  CL 92* 92*  CO2 21 21  GLUCOSE 62* 65*  BUN 85* 84*  CREATININE 11.83* 11.53*  CALCIUM 8.5 8.8  MG -- --  PHOS -- --   Liver Function Tests:  Basename 04/14/12 0610 04/13/12 1332  AST 32 36  ALT 25 31    ALKPHOS 156* 192*  BILITOT 0.8 0.8  PROT 7.1 8.3  ALBUMIN 2.6* 2.9*    Basename 04/13/12 1332  LIPASE 55  AMYLASE --    CBC:  Basename 04/14/12 0610 04/13/12 1332  WBC 11.4* 13.0*  NEUTROABS -- 7.3  HGB 9.6* 11.1*  HCT 26.6* 32.0*  MCV 70.6* 71.1*  PLT 507* 562*   Cardiac Enzymes:  Basename 04/13/12 1332  CKTOTAL --  CKMB --  CKMBINDEX --  TROPONINI <0.30    CBG:  Basename 04/14/12 1206 04/14/12 1144 04/14/12 0845 04/14/12 0748 04/13/12 2312  GLUCAP 96 55* 99 57* 141*    Coagulation:  Basename 04/14/12 0610 04/13/12 1332  INR 1.25 1.16   Urinalysis    Component Value Date/Time   COLORURINE AMBER* 04/13/2012 1547   APPEARANCEUR TURBID* 04/13/2012 1547   LABSPEC 1.029 04/13/2012 1547   PHURINE 5.0 04/13/2012 1547   GLUCOSEU NEGATIVE 04/13/2012 1547   HGBUR NEGATIVE 04/13/2012 1547   BILIRUBINUR SMALL* 04/13/2012 1547   KETONESUR 15* 04/13/2012 1547   PROTEINUR >300* 04/13/2012 1547   UROBILINOGEN 1.0 04/13/2012 1547   NITRITE NEGATIVE 04/13/2012 1547   LEUKOCYTESUR SMALL* 04/13/2012 1547   Urine microscopic: WBCs 0-2, squamous epithelial many, bacteria few  .  Imaging results:  Dg Chest 2 View  04/13/2012  *RADIOLOGY REPORT*  Clinical Data: Chest pain  CHEST - 2 VIEW  Comparison: 03/02/2012  Findings: Normal heart size.  Clear lungs.  No pneumothorax.  No pleural effusion.  IMPRESSION: No active cardiopulmonary  disease.   Original Report Authenticated By: Marybelle Killings, M.D.    US Renal  04/13/2012  *RADIOLOGY REPORT*  Clinical Data:  Acute renal failure  RENAL/URINARY TRACT ULTRASOUND COMPLETE  Comparison:  None.  Findings:  Right Kidney:  Right renal cysts noted. The largest is in the upper pole measuring 2.6 x 1.9 x 2.2 cm.  Normal in size and parenchymal echogenicity.  No evidence of mass or hydronephrosis.  Left Kidney:  Upper pole left renal cyst measures 2.2 x 1.6 x 3.0 cm.  Normal in size and parenchymal echogenicity.  No evidence of mass or  hydronephrosis.  Bladder:  Appears normal for degree of bladder distention.  IMPRESSION: 1.  Bilateral renal cyst. 2.  No obstructive uropathy.   Original Report Authenticated By: Kerby Moors, M.D.     Other results: EKG: Sinus rhythm; borderline inferior Q waves   Assessment & Plan by Problem:  1.  Acute renal failure.  Likely due to volume depletion from nausea, vomiting, and high ileostomy output; the FENa of 0.22 would support this.  Nephrology consult is following, and current plan is volume resuscitation and alkali therapy with bicarbonate-containing IV fluid; follow renal function closely; may need dialysis if renal function does not improve.  2. Metabolic acidosis.  Likely secondary to acute renal failure.  Would check a lactic acid level.  Management as above for #1.  3. High ileostomy output. Plan is Imodium; consult surgery regarding best management.  4. Diabetes mellitus.  Plan is Lantus with sliding scale insulin; follow blood sugar and adjust regimen as indicated.  Avoid metformin.  5.  Other plans as per resident physician's note.

## 2012-04-14 NOTE — Progress Notes (Signed)
Hypoglycemic Event  CBG:57   Treatment: D50 IV 25 mL  Symptoms: None  Follow-up CBG: [id number] CBG Result99:  Possible Reasons for Event: Other: NPO  Comments/MD notified:yes    [name] S  Remember to initiate Hypoglycemia Order Set & complete

## 2012-04-14 NOTE — Progress Notes (Signed)
Patient ID: [name], male   DOB: 05/03/1951, 61 y.o.   MRN: [id number] S:Feels hungy O:BP 89/56  Pulse 86  Temp 98.7 F (37.1 C) (Oral)  Resp 20  Ht 6' (1.829 m)  Wt 88.905 kg (196 lb)  BMI 26.58 kg/m2  SpO2 100%  Intake/Output Summary (Last 24 hours) at 04/14/12 1050 Last data filed at 04/14/12 0900  Gross per 24 hour  Intake 1196.83 ml  Output   1710 ml  Net -513.17 ml   Intake/Output: I/O last 3 completed shifts: In: 1195.8 [I.V.:1195.8] Out: 1660 [Urine:50; Stool:1610]  Intake/Output this shift:  Total I/O In: 1 [Other:1] Out: 50 [Stool:50] Weight change:  Gen:WD WN AAM inNAD CVS:no rub Resp:CTA [id number] in RLQ, +BS Ext:no edema   Lab 04/14/12 0610 04/13/12 2344 04/13/12 1332  NA 130* 130* 129*  K 4.4 4.9 5.4*  CL 92* 92* 92*  CO2 21 21 19   GLUCOSE 62* 65* 84  BUN 85* 84* 84*  CREATININE 11.83* 11.53* 11.24*  ALBUMIN 2.6* -- 2.9*  CALCIUM 8.5 8.8 9.5  PHOS -- -- --  AST 32 -- 36  ALT 25 -- 31   Liver Function Tests:  Lab 04/14/12 0610 04/13/12 1332  AST 32 36  ALT 25 31  ALKPHOS 156* 192*  BILITOT 0.8 0.8  PROT 7.1 8.3  ALBUMIN 2.6* 2.9*    Lab 04/13/12 1332  LIPASE 55  AMYLASE --   No results found for this basename: AMMONIA:3 in the last 168 hours CBC:  Lab 04/14/12 0610 04/13/12 1332  WBC 11.4* 13.0*  NEUTROABS -- 7.3  HGB 9.6* 11.1*  HCT 26.6* 32.0*  MCV 70.6* 71.1*  PLT 507* 562*   Cardiac Enzymes:  Lab 04/13/12 1332  CKTOTAL --  CKMB --  CKMBINDEX --  TROPONINI <0.30   CBG:  Lab 04/14/12 0748 04/13/12 2312  GLUCAP 57* 141*    Iron Studies: No results found for this basename: IRON,TIBC,TRANSFERRIN,FERRITIN in the last 72 hours Studies/Results: Dg Chest 2 View  04/13/2012  *RADIOLOGY REPORT*  Clinical Data: Chest pain  CHEST - 2 VIEW  Comparison: 03/02/2012  Findings: Normal heart size.  Clear lungs.  No pneumothorax.  No pleural effusion.  IMPRESSION: No active cardiopulmonary disease.   Original Report  Authenticated By: Marybelle Killings, M.D.    US Renal  04/13/2012  *RADIOLOGY REPORT*  Clinical Data:  Acute renal failure  RENAL/URINARY TRACT ULTRASOUND COMPLETE  Comparison:  None.  Findings:  Right Kidney:  Right renal cysts noted. The largest is in the upper pole measuring 2.6 x 1.9 x 2.2 cm.  Normal in size and parenchymal echogenicity.  No evidence of mass or hydronephrosis.  Left Kidney:  Upper pole left renal cyst measures 2.2 x 1.6 x 3.0 cm.  Normal in size and parenchymal echogenicity.  No evidence of mass or hydronephrosis.  Bladder:  Appears normal for degree of bladder distention.  IMPRESSION: 1.  Bilateral renal cyst. 2.  No obstructive uropathy.   Original Report Authenticated By: Kerby Moors, M.D.       . atorvastatin  20 mg Oral QHS  . heparin  5,000 Units Subcutaneous Q8H  . insulin aspart  0-9 Units Subcutaneous TID WC  . insulin glargine  10 Units Subcutaneous QHS  . levofloxacin  250 mg Oral Daily  . loperamide  2 mg Oral QID  . [COMPLETED] sodium chloride  1,000 mL Intravenous Once  . sodium chloride  3 mL Intravenous Q12H  . [COMPLETED] sodium polystyrene  15 g Oral Once  . [COMPLETED] white petrolatum        BMET    Component Value Date/Time   NA 130* 04/14/2012 0610   K 4.4 04/14/2012 0610   CL 92* 04/14/2012 0610   CO2 21 04/14/2012 0610   GLUCOSE 62* 04/14/2012 0610   BUN 85* 04/14/2012 0610   CREATININE 11.83* 04/14/2012 0610   CREATININE 1.12 07/04/2011 1516   CALCIUM 8.5 04/14/2012 0610   GFRNONAA 4* 04/14/2012 0610   GFRAA 5* 04/14/2012 0610   CBC    Component Value Date/Time   WBC 11.4* 04/14/2012 0610   RBC 3.77* 04/14/2012 0610   HGB 9.6* 04/14/2012 0610   HCT 26.6* 04/14/2012 0610   PLT 507* 04/14/2012 0610   MCV 70.6* 04/14/2012 0610   MCH 25.5* 04/14/2012 0610   MCHC 36.1* 04/14/2012 0610   RDW 19.2* 04/14/2012 0610   LYMPHSABS 4.8* 04/13/2012 1332   MONOABS 0.7 04/13/2012 1332   EOSABS 0.1 04/13/2012 1332   BASOSABS 0.0 04/13/2012  1332    Assessment/Plan:  1. Acute renal failure/acute kidney injury, non-oliguric-- (Creatinine was 1.0 on 03/09/12). His creatinine peaked above 17 during last hospitalization and then decreased to 3.6 on 04/05/2012. It is not known whether renal function will return to baseline. He is currently volume depleted and acidotic and only received 6 hours worth of therapy before labs were drawn this am. Furthermore, he was net negative with I's/O's and may need to increase IVF's to keep positive.  Would continue to recommend volume resuscitation and alkalai therapy and continue to follow UOP and daily Scr.  I discussed this plan with the housestaff .  No acute indication for dialysis at this time. Will cont to follow 2. Diarrhea per ostomy--Imodium has been given. Stool (for C. difficile and culture) has been ordered. He was on Levaquin prior to admission  3. Prior history diabetes mellitus-with his second episode of acute renal failure in less than one month it is advisable to avoid metformin from here on as well as ACE/ARB until renal function and diarrhea stabilize.  4. Prior history hypertension-is not hypertensive now. He had been on valsartan prior to admission. This should be avoided for now  5. Right hemicolectomy/anastomotic leak/intra-abdominal abscess-S./P. surgical correction(s). Surgery should be informed of his admission. They may have some insight into why he has had such severe diarrhea causing 2 admissions in 2 weeks.  GI eval may also be helpful in slowing fecal loss.  Need to r/o C. Diff etc.   Raymondo Garcialopez A

## 2012-04-14 NOTE — Progress Notes (Signed)
[facility]  Patient Status: Active (receiving services up to time of hospitalization)  [facility] is providing the following services: RN and MSW  If patient discharges after hours, please call [phone].   [name] 04/14/2012, 4:48 PM

## 2012-04-14 NOTE — Progress Notes (Signed)
Bp 78/46 manually Dr. Eulas Post notified. HR 79 100% on RA. Voices no complaints, mental status normal

## 2012-04-14 NOTE — Progress Notes (Signed)
Clinical Social Work Department BRIEF PSYCHOSOCIAL ASSESSMENT 04/14/2012  Patient:  [name],[name]     Account Number:  [id number]     Admit date:  04/13/2012  Clinical Social Worker:  Glenice Laine  Date/Time:  04/14/2012 11:00 AM  Referred by:  Physician  Date Referred:  04/14/2012 Referred for  Psychosocial assessment   Other Referral:   Interview type:  Patient Other interview type:    PSYCHOSOCIAL DATA Living Status:  FAMILY Admitted from facility:   Level of care:   Primary support name:  Sherron Flemings Primary support relationship to patient:  CHILD, ADULT Degree of support available:   Adequate    CURRENT CONCERNS Current Concerns  Post-Acute Placement   Other Concerns:    SOCIAL WORK ASSESSMENT / PLAN CSW intern met with pt at bedside to discuss post acute placement-[facility] SW very familiar with this pt-pt had been at [facility] with [name] letter of guarantee and upon last hospital admission pt declined returning to SNF and d/c home with family against recommendations from treatment team-  Pt declined while at home and now returned agreeable to possible SNF placement with [name] letter of guarantee-  CSW initiating FL2 and bed search-   Assessment/plan status:  Psychosocial Support/Ongoing Assessment of Needs Other assessment/ plan:   Information/referral to community resources:   SNF within 50 miles of [place]: Pt still hesitant to d/c to SNF and refuses [facility]-CSW explained limited options with regards to letter of guarantee and discussed the possibility of d/c out of county-  Pt relays he will "consider"  CSW will f/u with bed offers  Gerre Scull, [phone]

## 2012-04-14 NOTE — Progress Notes (Signed)
INITIAL ADULT NUTRITION ASSESSMENT Date: 04/14/2012   Time: 2:27 PM  Reason for Assessment: Malnutrition Screening  INTERVENTION: 1. Resource Breeze po TID, each supplement provides 250 kcal and 9 grams of protein. 2. Monitor magnesium, potassium, and phosphorus daily for at least 3 days, MD to replete as needed, as pt is at risk for refeeding syndrome given dx of severe malnutrition. 3. RD to continue to follow nutrition care plan  DOCUMENTATION CODES Per approved criteria  -Severe malnutrition in the context of chronic illness   ASSESSMENT: Male 61 y.o.  Dx: ARF  Hx:  Past Medical History  Diagnosis Date  . Hyperlipidemia   . Hypertension   . Heart murmur   . Diabetes mellitus     type 2 IDDM x 15 years  . Sleep apnea     does not wear CPAP now  . Colon polyp   . Arthritis     left knee  . 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 Colonoscopy   . Ganglion cyst excision 20 YRS AGO    RT ARM  . Partial colectomy 02/06/2012  . Partial colectomy 02/06/2012    Procedure: PARTIAL COLECTOMY;  Surgeon: Imogene Burn. [name], MD;  Location: [place];  Service: General;  Laterality: N/A;  right partial colectomy  . Colonoscopy 02/06/2012    Procedure: COLONOSCOPY;  Surgeon: Milus Banister, MD;  Location: [facility];  Service: Endoscopy;;  . Laparotomy 02/16/2012    Procedure: EXPLORATORY LAPAROTOMY;  Surgeon: Zenovia Jarred, MD;  Location: [place];  Service: General;  Laterality: N/A;  Exploratory Laparotomy with resection of anastomosis  . Ileostomy 02/16/2012    Procedure: ILEOSTOMY;  Surgeon: Zenovia Jarred, MD;  Location: [place];  Service: General;  Laterality: N/A;  . Application of wound vac 02/16/2012    Procedure: APPLICATION OF WOUND VAC;  Surgeon: Zenovia Jarred, MD;  Location: [place];  Service: General;  Laterality: N/A;  . Laparotomy 02/18/2012    Procedure: EXPLORATORY LAPAROTOMY;  Surgeon: Odis Hollingshead, MD;   Location: [place];  Service: General;  Laterality: N/A;  exploratory laparotomy  change of abdominal vac dressing  . Dressing change under anesthesia 02/18/2012    Procedure: DRESSING CHANGE UNDER ANESTHESIA;  Surgeon: Odis Hollingshead, MD;  Location: [place];  Service: General;  Laterality: N/A;  . Laparotomy 02/20/2012    Procedure: EXPLORATORY LAPAROTOMY;  Surgeon: Odis Hollingshead, MD;  Location: [facility];  Service: General;  Laterality: N/A;  . Vacuum assisted closure change 02/20/2012    Procedure: ABDOMINAL VACUUM ASSISTED CLOSURE CHANGE;  Surgeon: Odis Hollingshead, MD;  Location: [place];  Service: General;;  . Laparotomy 02/23/2012    Procedure: EXPLORATORY LAPAROTOMY;  Surgeon: Joyice Faster. Cornett, MD;  Location: [place] [facility];  Service: General;  Laterality: N/A;  Irrigation and Debridement of abdominal wound with wound vac change with partial closure  . Incision and drainage of wound 02/23/2012    Procedure: IRRIGATION AND DEBRIDEMENT WOUND;  Surgeon: Joyice Faster. Cornett, MD;  Location: [place];  Service: General;  Laterality: N/A;  . Vacuum assisted closure change 02/23/2012    Procedure: ABDOMINAL VACUUM ASSISTED CLOSURE CHANGE;  Surgeon: Joyice Faster. Cornett, MD;  Location: [place];  Service: General;  Laterality: N/A;  . Laparotomy 02/26/2012    Procedure: EXPLORATORY LAPAROTOMY;  Surgeon: Imogene Burn. [name], MD;  Location: [place];  Service: General;  Laterality: N/A;     . Application  of wound vac 02/26/2012    Procedure: APPLICATION OF WOUND VAC;  Surgeon: Imogene Burn. [name], MD;  Location: [facility];  Service: General;  Laterality: N/A;   Related Meds:     . atorvastatin  20 mg Oral QHS  . heparin  5,000 Units Subcutaneous Q8H  . insulin aspart  0-9 Units Subcutaneous TID WC  . insulin glargine  10 Units Subcutaneous QHS  . levofloxacin  250 mg Oral Daily  . loperamide  2 mg Oral QID  . [COMPLETED] sodium chloride  1,000 mL Intravenous Once  . sodium chloride  3 mL Intravenous Q12H  . [COMPLETED] sodium  polystyrene  15 g Oral Once  . [COMPLETED] white petrolatum       Ht: 6' (182.9 cm)  Wt: 196 lb (88.905 kg)  Ideal Wt: 178 lb/80.9 kg % Ideal Wt: 110%  Wt Readings from Last 15 Encounters:  04/13/12 196 lb (88.905 kg)  04/06/12 207 lb 3.7 oz (94 kg)  03/05/12 203 lb 4.2 oz (92.2 kg)  03/05/12 203 lb 4.2 oz (92.2 kg)  03/05/12 203 lb 4.2 oz (92.2 kg)  03/05/12 203 lb 4.2 oz (92.2 kg)  03/05/12 203 lb 4.2 oz (92.2 kg)  03/05/12 203 lb 4.2 oz (92.2 kg)  02/13/12 265 lb 6.9 oz (120.4 kg)  02/13/12 265 lb 6.9 oz (120.4 kg)  01/30/12 258 lb 14.4 oz (117.436 kg)  01/13/12 258 lb 1.6 oz (117.073 kg)  12/25/11 256 lb 6.4 oz (116.302 kg)  12/18/11 263 lb 4.8 oz (119.432 kg)  11/18/11 256 lb (116.121 kg)  Usual Wt: 256 lb - 5 months ago % Usual Wt: 77%; 23% wt loss x 5 months  Body mass index is 26.58 kg/(m^2). Overweight  Food/Nutrition Related Hx: continued poor PO intake and weight loss  Labs:  CMP     Component Value Date/Time   NA 130* 04/14/2012 0610   K 4.4 04/14/2012 0610   CL 92* 04/14/2012 0610   CO2 21 04/14/2012 0610   GLUCOSE 62* 04/14/2012 0610   BUN 85* 04/14/2012 0610   CREATININE 11.83* 04/14/2012 0610   CREATININE 1.12 07/04/2011 1516   CALCIUM 8.5 04/14/2012 0610   PROT 7.1 04/14/2012 0610   ALBUMIN 2.6* 04/14/2012 0610   AST 32 04/14/2012 0610   ALT 25 04/14/2012 0610   ALKPHOS 156* 04/14/2012 0610   BILITOT 0.8 04/14/2012 0610   GFRNONAA 4* 04/14/2012 0610   GFRAA 5* 04/14/2012 0610   Sodium  Date/Time Value Range Status  04/14/2012  6:10 AM 130* 135 - 145 mEq/L Final  04/13/2012 11:44 PM 130* 135 - 145 mEq/L Final  04/13/2012  1:32 PM 129* 135 - 145 mEq/L Final    Potassium  Date/Time Value Range Status  04/14/2012  6:10 AM 4.4  3.5 - 5.1 mEq/L Final  04/13/2012 11:44 PM 4.9  3.5 - 5.1 mEq/L Final  04/13/2012  1:32 PM 5.4* 3.5 - 5.1 mEq/L Final    Phosphorus  Date/Time Value Range Status  04/05/2012  5:03 AM 1.9* 2.3 - 4.6 mg/dL Final    04/04/2012  5:43 AM 2.6  2.3 - 4.6 mg/dL Final  04/03/2012  6:56 AM 2.9  2.3 - 4.6 mg/dL Final    Magnesium  Date/Time Value Range Status  03/03/2012  6:10 AM 1.8  1.5 - 2.5 mg/dL Final  03/02/2012  3:59 AM 1.7  1.5 - 2.5 mg/dL Final  03/01/2012  4:50 AM 1.8  1.5 - 2.5 mg/dL Final    Intake/Output Summary (  Last 24 hours) at 04/14/12 1429 Last data filed at 04/14/12 1300  Gross per 24 hour  Intake 1428.83 ml  Output   1590 ml  Net -161.17 ml   Diet Order: Renal 60 - 70; 1200 ml fluid restriction  Supplements/Tube Feeding: none  IVF:    sodium bicarbonate infusion 1000 mL Last Rate: 250 mL/hr at 04/13/12 2334  And   sodium bicarbonate infusion 1000 mL Last Rate: 150 mL/hr at 04/14/12 1216  [DISCONTINUED] sodium chloride Last Rate: 150 mL (04/13/12 1814)  [DISCONTINUED]  sodium bicarbonate infusion 1000 mL    Estimated Nutritional Needs:   Kcal: 2150-2350   Protein:  105-120 gm Fluid: 2.2 - 2.4 liters daily  Hx of colon adenocarcinoma s/p r hemicolectomy complicated by anastamotic leak with intraabdominal abscess and surgical site infection, recently d/c'd after being treated for acute renal injury requiring hemodialysis.  Admitted with recurrent symptoms of nausea, vomiting, and high output from his ileostomy for the past 4 days, looks like clear liquid with bits of solids. He states that he has not been able to keep anything down and has become more and more dehydrated. He vomits everything he eats and drinks and has had to change his ostomy bag more than usual. He also states that he hasn't urinated since the day before admission and it was a small amount.   Pt seen by RD during previous hospitalization. Noted pt to have severe malnutrition in the context of chronic illness as evidenced by 26% weight loss in 1.5 months, intake of <75% of estimated energy needs x at least 1 month, and severe loss of muscle mass and subcutaneous fat at that time.    Patient with muscle wasting in  the temples and clavicles, and subcutaneous fat loss in the triceps and orbital areas. Pt states that he ate lunch today and consumed 100%. Interested in [employer].  NUTRITION DIAGNOSIS: Inadequate oral intake r/t GI distress AEB ongoing significant wt loss and poor PO intake.  MONITORING/EVALUATION(Goals): Goal: Pt to meet >/= 90% of their estimated nutrition needs Monitor: weight trends, lab trends, I/O's, PO intake, supplement tolerance  EDUCATION NEEDS: -No education needs identified at this time    Inda Coke MS, RD, LDN Pager: [phone] After-hours pager: [phone]

## 2012-04-15 DIAGNOSIS — K912 Postsurgical malabsorption, not elsewhere classified: Secondary | ICD-10-CM | POA: Diagnosis present

## 2012-04-15 DIAGNOSIS — K90829 Short bowel syndrome, unspecified: Secondary | ICD-10-CM | POA: Diagnosis present

## 2012-04-15 DIAGNOSIS — E860 Dehydration: Secondary | ICD-10-CM | POA: Diagnosis present

## 2012-04-15 DIAGNOSIS — E43 Unspecified severe protein-calorie malnutrition: Secondary | ICD-10-CM

## 2012-04-15 DIAGNOSIS — R1110 Vomiting, unspecified: Secondary | ICD-10-CM

## 2012-04-15 DIAGNOSIS — D72829 Elevated white blood cell count, unspecified: Secondary | ICD-10-CM

## 2012-04-15 LAB — GLUCOSE, CAPILLARY
Glucose-Capillary: 90 mg/dL (ref 70–99)
Glucose-Capillary: 69 mg/dL — ABNORMAL LOW (ref 70–99)
Glucose-Capillary: 119 mg/dL — ABNORMAL HIGH (ref 70–99)
Glucose-Capillary: 113 mg/dL — ABNORMAL HIGH (ref 70–99)
Glucose-Capillary: 96 mg/dL (ref 70–99)
Glucose-Capillary: 93 mg/dL (ref 70–99)

## 2012-04-15 LAB — BASIC METABOLIC PANEL
BUN: 85 mg/dL — ABNORMAL HIGH (ref 6–23)
CO2: 27 mEq/L (ref 19–32)
Calcium: 7.8 mg/dL — ABNORMAL LOW (ref 8.4–10.5)
Chloride: 93 mEq/L — ABNORMAL LOW (ref 96–112)
Creatinine, Ser: 11.43 mg/dL — ABNORMAL HIGH (ref 0.50–1.35)
GFR calc Af Amer: 5 mL/min — ABNORMAL LOW (ref >90)
GFR calc non Af Amer: 4 mL/min — ABNORMAL LOW (ref >90)
Glucose, Bld: 66 mg/dL — ABNORMAL LOW (ref 70–99)
Potassium: 4 mEq/L (ref 3.5–5.1)
Sodium: 133 mEq/L — ABNORMAL LOW (ref 135–145)

## 2012-04-15 LAB — CBC
HCT: 25.9 % — ABNORMAL LOW (ref 39.0–52.0)
Hemoglobin: 9 g/dL — ABNORMAL LOW (ref 13.0–17.0)
MCH: 24.1 pg — ABNORMAL LOW (ref 26.0–34.0)
MCHC: 34.7 g/dL (ref 30.0–36.0)
MCV: 69.4 fL — ABNORMAL LOW (ref 78.0–100.0)
Platelets: 443 10*3/uL — ABNORMAL HIGH (ref 150–400)
RBC: 3.73 MIL/uL — ABNORMAL LOW (ref 4.22–5.81)
RDW: 18.6 % — ABNORMAL HIGH (ref 11.5–15.5)
WBC: 9.2 10*3/uL (ref 4.0–10.5)

## 2012-04-15 MED ORDER — SODIUM CHLORIDE 0.9 % IV SOLN
INTRAVENOUS | Status: DC
  Administered 2012-04-15: 20:00:00 via INTRAVENOUS
  Administered 2012-04-15: 150 mL via INTRAVENOUS
  Administered 2012-04-16 – 2012-04-19 (×8): via INTRAVENOUS

## 2012-04-15 MED ORDER — CHOLESTYRAMINE LIGHT 4 G PO PACK
4.0000 g | PACK | ORAL | Status: DC
  Administered 2012-04-15 – 2012-04-22 (×11): 4 g via ORAL
  Filled 2012-04-15 (×16): qty 1

## 2012-04-15 MED ORDER — PANTOPRAZOLE SODIUM 40 MG PO TBEC
40.0000 mg | DELAYED_RELEASE_TABLET | Freq: Every day | ORAL | Status: DC
  Administered 2012-04-15 – 2012-04-22 (×8): 40 mg via ORAL
  Filled 2012-04-15 (×7): qty 1

## 2012-04-15 NOTE — Consult Note (Signed)
[facility] Consult: 12:11 PM 04/15/2012   Referring Provider: Dr Marinda Elk Primary Care Physician:  [name], MD Primary Gastroenterologist:  Dr. Thomasenia Sales   Reason for Consultation:  High ostomy output.   HPI: [name] is a insulin dependent diabetic 61 y.o. male. Hx of 2009 adenomatous polyp in right colon. 2011 colonoscopy with ascending adenomatous polyp with foci of high grade dysplasia.  Colonoscopy 11/18/11, found non-obstructing mass in distal tranverse colon.  Bx revealed high grade dysplasia.  S/P 02/06/12 laparoscopic right hemicolectomy with primary anastomosis. Dr Ardis Hughs did introp colonoscopy to localize the polyp site that had previously been tattooed. 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  Had acute renal failure due to dehydration post op. He had several formed stools before his discharge on 9/13.    Readmitted 9/15 - 10/7 with free air, anastomotic leak and abcess. Sepsis. Underwent repeat surgery with creation of end ileostomy, resection of 20 cm intestine including some ileum, placement of Hartmann's pouch, placement of wound VAC.  Wound was left open.  Required several subsequent wash-out surgeries. Intubated due to sepsis. Discharged to SNF  Readmitted 10/27 - 11/4 with acute renal failure requiring 2 sessions of HD, increased ostomy output despite up to 4 immodium per day, poor po intake, nausea and vomitting, E Coli UTI, decubitus ulcer, hyperkalemia.  Anemia noted:  Hgb 8.2, compared to 13.5 on initial admission (when dehydrated) Discharged to home at insistence of pt and his daughters.   Readmitted 11/11.  Again with decreased po intake, high ostomy output, nausea and vomitting "everything he eats", acute renal failure with acidosis.  Abdominal wound healing is not a problem, it is "closing up" nicely.  Dr [name] has seen pt and says he is not a  candidate for ileostomy reversal for several more months.  At present the n/v has resolved and he is tolerating solid food.  Unable to eat some foods due to lack of teeth and limited mastication.  Ostomy bag was filling up about 4 times daily. Volume recorded on 11/11 was 1285 ml, on 11/12: 1330 ml, today: 325 ml recorded thus far and pt says stool has more substance to it, less volume of water.   Home meds included Imodium 4 x daily, Metformin (known to cause diarrhea but in use prior to surgery and never caused problems).   Past Medical History  Diagnosis Date  . Hyperlipidemia   . Hypertension   . Heart murmur   . Diabetes mellitus     type 2 IDDM x 15 years  . Sleep apnea     does not wear CPAP now  . Colon polyp   . Arthritis     left knee  . 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 12/25/2011    Past Surgical History  Procedure Date  . Colonoscopy   . Ganglion cyst excision 20 YRS AGO    RT ARM  . Partial colectomy 02/06/2012  . Partial colectomy 02/06/2012    Procedure: PARTIAL COLECTOMY;  Surgeon: Imogene Burn. [name], MD;  Location: [place];  Service: General;  Laterality: N/A;  right partial colectomy  . Colonoscopy 02/06/2012    Procedure: COLONOSCOPY;  Surgeon: Milus Banister, MD;  Location: [place];  Service: Endoscopy;;  . Laparotomy 02/16/2012    Procedure: EXPLORATORY LAPAROTOMY;  Surgeon: Zenovia Jarred, MD;  Location: [place];  Service: General;  Laterality: N/A;  Exploratory Laparotomy with resection of anastomosis  . Ileostomy 02/16/2012    Procedure: ILEOSTOMY;  Surgeon: Zenovia Jarred, MD;  Location: [place];  Service: General;  Laterality: N/A;  . Application of wound vac 02/16/2012    Procedure: APPLICATION OF WOUND VAC;  Surgeon: Zenovia Jarred, MD;  Location: [place];  Service: General;  Laterality: N/A;  . Laparotomy 02/18/2012    Procedure: EXPLORATORY LAPAROTOMY;  Surgeon: Odis Hollingshead, MD;  Location: [place];  Service: General;  Laterality: N/A;   exploratory laparotomy  change of abdominal vac dressing  . Dressing change under anesthesia 02/18/2012    Procedure: DRESSING CHANGE UNDER ANESTHESIA;  Surgeon: Odis Hollingshead, MD;  Location: [place];  Service: General;  Laterality: N/A;  . Laparotomy 02/20/2012    Procedure: EXPLORATORY LAPAROTOMY;  Surgeon: Odis Hollingshead, MD;  Location: [place];  Service: General;  Laterality: N/A;  . Vacuum assisted closure change 02/20/2012    Procedure: ABDOMINAL VACUUM ASSISTED CLOSURE CHANGE;  Surgeon: Odis Hollingshead, MD;  Location: [place];  Service: General;;  . Laparotomy 02/23/2012    Procedure: EXPLORATORY LAPAROTOMY;  Surgeon: Joyice Faster. Cornett, MD;  Location: [place];  Service: General;  Laterality: N/A;  Irrigation and Debridement of abdominal wound with wound vac change with partial closure  . Incision and drainage of wound 02/23/2012    Procedure: IRRIGATION AND DEBRIDEMENT WOUND;  Surgeon: Joyice Faster. Cornett, MD;  Location: [place];  Service: General;  Laterality: N/A;  . Vacuum assisted closure change 02/23/2012    Procedure: ABDOMINAL VACUUM ASSISTED CLOSURE CHANGE;  Surgeon: Joyice Faster. Cornett, MD;  Location: [place];  Service: General;  Laterality: N/A;  . Laparotomy 02/26/2012    Procedure: EXPLORATORY LAPAROTOMY;  Surgeon: Imogene Burn. [name], MD;  Location: [place];  Service: General;  Laterality: N/A;     . Application of wound vac 02/26/2012    Procedure: APPLICATION OF WOUND VAC;  Surgeon: Imogene Burn. [name], MD;  Location: [place];  Service: General;  Laterality: N/A;    Prior to Admission medications   Medication Sig Start Date End Date Taking? Authorizing Provider  atorvastatin (LIPITOR) 20 MG tablet Take 20 mg by mouth at bedtime.   Yes Historical Provider, MD  loperamide (IMODIUM) 2 MG capsule Take 1 capsule (2 mg total) by mouth 4 (four) times daily as needed for diarrhea or loose stools (for very loose ileostomy output; output should be thicker (pancake batter consistency) not watery). 03/10/12   Yes Imogene Burn. Tsuei, MD    Scheduled Meds:    . atorvastatin  20 mg Oral QHS  . feeding supplement  1 Container Oral TID BM  . heparin  5,000 Units Subcutaneous Q8H  . insulin aspart  0-9 Units Subcutaneous TID WC  . insulin glargine  10 Units Subcutaneous QHS  . levofloxacin  250 mg Oral Daily  . loperamide  2 mg Oral QID  . paregoric  5 mL Oral BID  . sodium chloride  3 mL Intravenous Q12H   Infusions:    . [EXPIRED]  sodium bicarbonate infusion 1000 mL 120 mL/hr at 04/14/12 1520   And  .  sodium bicarbonate infusion 1000 mL 150 mL/hr at 04/15/12 0833   PRN Meds: acetaminophen, acetaminophen, ondansetron (ZOFRAN) IV   Allergies as of 04/13/2012  . (No Known Allergies)    Family History  Problem Relation Age of Onset  . Diabetes Father   . Heart disease Father   . Colon cancer Neg Hx   . Diabetes Sister   . Heart disease Sister   . Heart disease  Mother     History   Social History  . Marital Status: Divorced    Spouse Name: N/A    Number of Children: N/A  . Years of Education: N/A   Occupational History  . Not on file.   Social History Main Topics  . Smoking status: Current Every Day Smoker -- 0.5 packs/day for 30 years    Types: Cigarettes  . Smokeless tobacco: Never Used     Comment: faxing referral to quitline when signed by patient  . Alcohol Use: Yes     Comment: LIQUOR OR WINE WEEKLY.  NONE SINCE 02/2012 SURGERY  . Drug Use: No  . Sexually Active: Not Currently   Other Topics Concern  . Not on file   Social History Narrative     Born and grew up in [place], graduated from [employer], Lives by himself, he was married 69 yr but he       and his wife divorced 4 years ago. He smokes cigarettes (30 pack years), drinks alcohol once a month (none since        his surgery). 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   REVIEW OF SYSTEMS: Constitutional:  Weight loss of 50 #  since 02/2012 surgery ENT:  No nose bleeds or sinus congestion Pulm:  No cough, no trouble breathing CV:  No palpitations, chest pain, LE edema. GU:  Nearly anuric at home PTA, urine out put is improved GI:  No heartburn or dysphagia. No hx of ulcers.  No diarrhea after the initial right colectomy Heme:  No hx of anemia.    Transfusions:  No hx of transfusions Neuro:  No headaches.  Had syncopal spell at home before one of his recent admissions Derm:  No rashes, no sores on feet, no pruritus Endocrine:  No excessive thirst, no sweats.  No hx thyroid disease Immunization:  Flu shot updated 02/2012.  Travel:  none   PHYSICAL EXAM: Vital signs in last 24 hours: Temp:  [98.1 F (36.7 C)-98.6 F (37 C)] 98.1 F (36.7 C) (11/13 1014) Pulse Rate:  [79-90] 80  (11/13 1014) Resp:  [17-20] 20  (11/13 1014) BP: (90-102)/(44-63) 102/59 mmHg (11/13 1014) SpO2:  [97 %-100 %] 97 % (11/13 1014) Weight:  [92.398 kg (203 lb 11.2 oz)] 92.398 kg (203 lb 11.2 oz) (11/12 2257)  General: overweight, well-appearing AAM.  NAD Head:  No swelling or asymmetry  Eyes:  No icterus or conj pallor.  EOMI Ears:  Not HOH  Nose:  No discharge or congestion Mouth:  Few teeth, MM moist and clear Neck:  No mass, no JVD, no bruits Lungs:  Clear.  No dyspnea or cough Heart: RRR.  No MRG Abdomen:  Large, obese, ostomy on right with brown, liquid, non-bloody stool.  Midline incision with 3 inch bandage covering the still healing wound.  Rectal: not done   GU:  Uncircumcised, foley catheter in place. Musc/Skeltl: no joint contractures or swellling Extremities:  No pedal edema  Neurologic:  Pleasant, oriented x 3.  No tremor.  Moves all 4 limbs easily.  Skin:  No rash. Tattoos:  none Nodes:  No inguinal or cervical adenopathy.   Psych:  Pleasant, relaxed cooperative.   Intake/Output from previous day: 11/12 0701 - 11/13 0700 In: 233 [P.O.:232] Out: 1330 [Urine:30; Stool:1300] Intake/Output this shift: Total  I/O In: 240 [P.O.:240] Out: 250 [Stool:250]  LAB RESULTS:  [facility]  04/15/12 0905 04/14/12 0610 04/13/12 1332  WBC 9.2 11.4* 13.0*  HGB 9.0* 9.6* 11.1*  HCT 25.9* 26.6* 32.0*  PLT 443* 507* 562*   BMET Lab Results  Component Value Date   NA 133* 04/15/2012   NA 129* 04/14/2012   NA 130* 04/14/2012   K 4.0 04/15/2012   K 4.5 04/14/2012   K 4.4 04/14/2012   CL 93* 04/15/2012   CL 90* 04/14/2012   CL 92* 04/14/2012   CO2 27 04/15/2012   CO2 22 04/14/2012   CO2 21 04/14/2012   GLUCOSE 66* 04/15/2012   GLUCOSE 85 04/14/2012   GLUCOSE 62* 04/14/2012   BUN 85* 04/15/2012   BUN 87* 04/14/2012   BUN 85* 04/14/2012   CREATININE 11.43* 04/15/2012   CREATININE 11.70* 04/14/2012   CREATININE 11.83* 04/14/2012   CALCIUM 7.8* 04/15/2012   CALCIUM 7.9* 04/14/2012   CALCIUM 8.5 04/14/2012   LFT  Basename 04/14/12 0610 04/13/12 1332  PROT 7.1 8.3  ALBUMIN 2.6* 2.9*  AST 32 36  ALT 25 31  ALKPHOS 156* 192*  BILITOT 0.8 0.8  BILIDIR -- --  IBILI -- --   PT/INR Lab Results  Component Value Date   INR 1.25 04/14/2012   INR 1.16 04/13/2012   INR 1.32 02/16/2012    RADIOLOGY STUDIES: Dg Chest 2 View 04/13/2012   IMPRESSION: No active cardiopulmonary disease.   Original Report Authenticated By: Marybelle Killings, M.D.    US Renal 04/13/2012   IMPRESSION: 1.  Bilateral renal cyst. 2.  No obstructive uropathy.   Original Report Authenticated By: Kerby Moors, M.D.    ENDOSCOPIC STUDIES: 11/2011  Colonoscopy  01/2010  Colonoscopy 1) Sessile polyp in the ascending colon  2) Otherwise normal examination  2009  Colonoscopy Adenomatous polyp in right colon   IMPRESSION: *  High ouput from ileostomy.  Nausea, vomitting and subsequent dehydration.  *  Recurrent ARF, needed di *  S/p right hemicolectomy with small amount of terminal ileum seen  *  IDDM   PLAN: *  Add Questran, continue scheduled Imodium, add Protonix to decrease gastric acid secretion   LOS: 2 days    Azucena Freed  04/15/2012, 12:11 PM Pager: [phone]  GI ATTENDING   PATIENT WELL KNOWN TO GI. NOW WITH HIGH OSTOMY OUTPUT POST-OP, WHICH IS NOT UNCOMMON EARLY POST BOWEL RESECTION. THE KEY ELEMENTS TO CONSIDER ARE THE REGIONS OF BOWEL RESECTED AND THE TOTAL LENGTH . FORTUNATELY MINIMAL BOWEL RESECTION. WITH TIME, HOPEFULLY HE SHOULD DEVELOP ADEQUATE INTESTINAL ADAPTATION. UNTIL THEN, RECOMMEND THE FOLLOWING:   1. ANTIDIARRHEALS (SLOW TRANSIT)  2. PPI (DECREASE GASTRIC SECRETION)  3. QUESTRAN HELPFUL SINCE < 100 CM DISTAL SMALL BOWEL REMOVED  4. EARLY TNA (CONSULT NUTRITION / PHARMACY) MAY BE HELPFUL 5. REPLACE LOSSES BEYOND TNA WITH IVF AND CORRECT LYTES AS NEEDED (ESPECIALLY Na+, K+, Mg++)  6. INTRODUCE ENTERAL FEEDS SLOWLY AND MONITOR  7. B12 REPLACEMENT IF NEEDED  8. RENAL TO MONITOR KIDNEY FUNCTION CLOSELY  Will follow. Thanks   Docia Chuck. Geri Seminole., M.D.  [facility]  Division of Gastroenterology

## 2012-04-15 NOTE — Progress Notes (Signed)
Patient ID: [name], male   DOB: Aug 30, 1950, 61 y.o.   MRN: [id number] S:c/o sinus congestion O:BP 102/59  Pulse 80  Temp 98.1 F (36.7 C) (Oral)  Resp 20  Ht 6' (1.829 m)  Wt 92.398 kg (203 lb 11.2 oz)  BMI 27.63 kg/m2  SpO2 97%  Intake/Output Summary (Last 24 hours) at 04/15/12 1217 Last data filed at 04/15/12 0900  Gross per 24 hour  Intake    360 ml  Output   1500 ml  Net  -1140 ml   Intake/Output: I/O last 3 completed shifts: In: 1428.8 [P.O.:232; I.V.:1195.8; Other:1] Out: 2105 [Urine:80; Stool:2025]  Intake/Output this shift:  Total I/O In: 240 [P.O.:240] Out: 250 [Stool:250] Weight change: 3.198 kg (7 lb 0.8 oz) Gen:WD WN AAM in NAD CVS:no rub Resp:CTA [id number], ostomy site C/D/I Ext:no edema   Lab 04/15/12 0600 04/14/12 2027 04/14/12 0610 04/13/12 2344 04/13/12 1332  NA 133* 129* 130* 130* 129*  K 4.0 4.5 4.4 4.9 5.4*  CL 93* 90* 92* 92* 92*  CO2 27 22 21 21 19   GLUCOSE 66* 85 62* 65* 84  BUN 85* 87* 85* 84* 84*  CREATININE 11.43* 11.70* 11.83* 11.53* 11.24*  ALBUMIN -- -- 2.6* -- 2.9*  CALCIUM 7.8* 7.9* 8.5 8.8 9.5  PHOS -- -- -- -- --  AST -- -- 32 -- 36  ALT -- -- 25 -- 31   Liver Function Tests:  Lab 04/14/12 0610 04/13/12 1332  AST 32 36  ALT 25 31  ALKPHOS 156* 192*  BILITOT 0.8 0.8  PROT 7.1 8.3  ALBUMIN 2.6* 2.9*    Lab 04/13/12 1332  LIPASE 55  AMYLASE --   No results found for this basename: AMMONIA:3 in the last 168 hours CBC:  Lab 04/15/12 0905 04/14/12 0610 04/13/12 1332  WBC 9.2 11.4* 13.0*  NEUTROABS -- -- 7.3  HGB 9.0* 9.6* 11.1*  HCT 25.9* 26.6* 32.0*  MCV 69.4* 70.6* 71.1*  PLT 443* 507* 562*   Cardiac Enzymes:  Lab 04/13/12 1332  CKTOTAL --  CKMB --  CKMBINDEX --  TROPONINI <0.30   CBG:  Lab 04/15/12 1207 04/15/12 0846 04/15/12 0757 04/14/12 2213 04/14/12 1616  GLUCAP 113* 119* 69* 90 80    Iron Studies: No results found for this basename: IRON,TIBC,TRANSFERRIN,FERRITIN in the last 72  hours Studies/Results: Dg Chest 2 View  04/13/2012  *RADIOLOGY REPORT*  Clinical Data: Chest pain  CHEST - 2 VIEW  Comparison: 03/02/2012  Findings: Normal heart size.  Clear lungs.  No pneumothorax.  No pleural effusion.  IMPRESSION: No active cardiopulmonary disease.   Original Report Authenticated By: Marybelle Killings, M.D.    US Renal  04/13/2012  *RADIOLOGY REPORT*  Clinical Data:  Acute renal failure  RENAL/URINARY TRACT ULTRASOUND COMPLETE  Comparison:  None.  Findings:  Right Kidney:  Right renal cysts noted. The largest is in the upper pole measuring 2.6 x 1.9 x 2.2 cm.  Normal in size and parenchymal echogenicity.  No evidence of mass or hydronephrosis.  Left Kidney:  Upper pole left renal cyst measures 2.2 x 1.6 x 3.0 cm.  Normal in size and parenchymal echogenicity.  No evidence of mass or hydronephrosis.  Bladder:  Appears normal for degree of bladder distention.  IMPRESSION: 1.  Bilateral renal cyst. 2.  No obstructive uropathy.   Original Report Authenticated By: Kerby Moors, M.D.       . atorvastatin  20 mg Oral QHS  . feeding supplement  1 Container Oral  TID BM  . heparin  5,000 Units Subcutaneous Q8H  . insulin aspart  0-9 Units Subcutaneous TID WC  . insulin glargine  10 Units Subcutaneous QHS  . levofloxacin  250 mg Oral Daily  . loperamide  2 mg Oral QID  . paregoric  5 mL Oral BID  . sodium chloride  3 mL Intravenous Q12H    BMET    Component Value Date/Time   NA 133* 04/15/2012 0600   K 4.0 04/15/2012 0600   CL 93* 04/15/2012 0600   CO2 27 04/15/2012 0600   GLUCOSE 66* 04/15/2012 0600   BUN 85* 04/15/2012 0600   CREATININE 11.43* 04/15/2012 0600   CREATININE 1.12 07/04/2011 1516   CALCIUM 7.8* 04/15/2012 0600   GFRNONAA 4* 04/15/2012 0600   GFRAA 5* 04/15/2012 0600   CBC    Component Value Date/Time   WBC 9.2 04/15/2012 0905   RBC 3.73* 04/15/2012 0905   HGB 9.0* 04/15/2012 0905   HCT 25.9* 04/15/2012 0905   PLT 443* 04/15/2012 0905   MCV 69.4*  04/15/2012 0905   MCH 24.1* 04/15/2012 0905   MCHC 34.7 04/15/2012 0905   RDW 18.6* 04/15/2012 0905   LYMPHSABS 4.8* 04/13/2012 1332   MONOABS 0.7 04/13/2012 1332   EOSABS 0.1 04/13/2012 1332   BASOSABS 0.0 04/13/2012 1332    Assessment/Plan:  1. Acute renal failure/acute kidney injury, oliguric-- (Creatinine was 1.0 on 03/09/12). His creatinine peaked above 17 during last hospitalization and then decreased to 3.6 on 04/05/2012. It is not known whether renal function will return to baseline. He remains volume depleted and remains negative on I's/O's despite IVF's which appear to have been stopped.  Would continue to recommend volume resuscitation and alkalai therapy and continue to follow UOP and daily Scr. No acute indication for dialysis at this time. Will cont to follow 2. Diarrhea per ostomy--Imodium has been given. Stool (for C. difficile and culture) has been ordered. He was on Levaquin prior to admission  3. Prior history diabetes mellitus-with his second episode of acute renal failure in less than one month it is advisable to avoid metformin from here on as well as ACE/ARB until renal function and diarrhea stabilize.  4. Prior history hypertension-is not hypertensive now. He had been on valsartan prior to admission. This should be avoided for now  5. Right hemicolectomy/anastomotic leak/intra-abdominal abscess-S./P. surgical correction(s). Surgery should be informed of his admission. They may have some insight into why he has had such severe diarrhea causing 2 admissions in 2 weeks. GI eval may also be helpful in slowing fecal loss. Need to r/o C. Diff etc. 6. Metabolic acidosis- resolved.  Switch to NS 7. Hyperkalemia- resolved 8. dispo- per primary svc  Allysia Ingles A

## 2012-04-15 NOTE — Progress Notes (Signed)
Patient ID: [name], male   DOB: 24-Aug-1950, 61 y.o.   MRN: [id number] Medical Student Daily Progress Note   Subjective:    Interval Events:  Overnight the pt's pressure have remained fairly low 90's/50's but have increased slightly this morning to 100/63.  Overnight the pt's blood sugars have ranged 80-96 this morning however his glucose was 66.  Since yesterday surgery has seen the pt and recommended a GI consult and a potential PICC line for continued fluid administration after discharge.  Social work has also seen the pt and discussed SNF placement after discharge.  The pt complains of poor sleep secondary to nasal congestion, he denies rhinorrhea, sore throat, cough, fever, chills, and HA.  He also complains of mild abdominal pain around his incision site which was relieved with tylenol.  He feels his appetite is coming back as well.  Pt denies currently changes in vision, abdominal pain, weakness, and change in sensation of the extremities.    Objective:    Vital Signs:   Temp:  [98.2 F (36.8 C)-98.7 F (37.1 C)] 98.4 F (36.9 C) (11/13 0515) Pulse Rate:  [79-90] 79  (11/13 0515) Resp:  [17-20] 17  (11/13 0515) BP: (89-100)/(44-63) 100/63 mmHg (11/13 0515) SpO2:  [98 %-100 %] 100 % (11/13 0515) Weight:  [92.398 kg (203 lb 11.2 oz)] 92.398 kg (203 lb 11.2 oz) (11/12 2257) Last BM Date: 04/15/12   Weights: 24-hour Weight change: 3.198 kg (7 lb 0.8 oz)  Filed Weights   04/13/12 1756 04/13/12 2055 04/14/12 2257  Weight: 89.2 kg (196 lb 10.4 oz) 88.905 kg (196 lb) 92.398 kg (203 lb 11.2 oz)   Net since admission:  +3.2   Intake/Output:   Intake/Output Summary (Last 24 hours) at 04/15/12 0934 Last data filed at 04/15/12 0516  Gross per 24 hour  Intake    232 ml  Output   1280 ml  Net  -1048 ml     Net since admission:  -1571   Physical Exam: GENERAL:  alert and oriented; resting comfortably in bed and in no distress EYES:  pupils equal, round, and reactive to  light; sclera anicteric ENT:  moist mucosa NECK:  no JVD, shotty lymphadenopathy in anterior cervical region LUNGS:  clear to auscultation bilaterally, normal work of breathing HEART:  normal rate; regular rhythm; normal S1 and S2, no S3 or S4 appreciated; no murmurs, rubs, or clicks ABDOMEN:  soft, non-tender, normal bowel sounds, no masses palpated, vertical midline superficially open incision appreciated above umbilicus good granulation tissue with no drainage, 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 with mixture of solid and liquid stool in bag GENITOURINARY: Foley catheter in place, urine present in foley bag yellow in color some sediment present at bottom of bag EXTREMITIES:  No signs of peripheral edema, dry skin noted on legs bilaterally, no calf pain with palpation, 2+ pulses radial and dorsalis pedis NEURO: EOMI, palate rises symmetrically, symmetric smile, tongue protrudes to midline, patient moved all extremities spontaneously, 5/5 grip, dorsiflexion, and plantarflexion SKIN:  normal turgor, skin tag noted on posterior aspect of pt's left neck, no rashes   Labs: Basic Metabolic Panel:  Lab [id number] 0600 04/14/12 2027 04/14/12 0610 04/13/12 2344 04/13/12 1332  NA 133* 129* 130* 130* 129*  K 4.0 4.5 4.4 4.9 5.4*  CL 93* 90* 92* 92* 92*  CO2 27 22 21 21 19   GLUCOSE 66* 85 62* 65* 84  BUN 85* 87* 85* 84* 84*  CREATININE 11.43* 11.70* 11.83* 11.53* 11.24*  CALCIUM 7.8* 7.9* 8.5 -- --  MG -- -- -- -- --  PHOS -- -- -- -- --    Liver Function Tests:  Lab 04/14/12 0610 April 15, 2012 1332  AST 32 36  ALT 25 31  ALKPHOS 156* 192*  BILITOT 0.8 0.8  PROT 7.1 8.3  ALBUMIN 2.6* 2.9*    Lab 04/15/2012 1332  LIPASE 55  AMYLASE --    CBC:  Lab 04/14/12 0610 April 15, 2012 1332  WBC 11.4* 13.0*  NEUTROABS -- 7.3  HGB 9.6* 11.1*  HCT 26.6* 32.0*  MCV 70.6* 71.1*  PLT 507* 562*    Cardiac Enzymes:  Lab 04-15-2012 1332  CKTOTAL --  CKMB --  CKMBINDEX --  TROPONINI <0.30   CBG:  Lab 04/14/12  2213 04/14/12 1616 04/14/12 1206 04/14/12 1144 04/14/12 0845  GLUCAP 90 80 96 55* 99    Coagulation Studies:  Basename 04/14/12 0610 2012-04-15 1332  LABPROT 15.5* 14.6  INR 1.25 1.16    Microbiology: Results for orders placed during the hospital encounter of 15-Apr-2012  URINE CULTURE     Status: Normal   Collection Time   04/15/12  3:47 PM      Component Value Range Status Comment   Specimen Description URINE, CLEAN CATCH   Final    Special Requests NONE   Final    Culture  Setup Time 2012/04/15 16:47   Final    Colony Count NO GROWTH   Final    Culture NO GROWTH   Final    Report Status 04/14/2012 FINAL   Final   CLOSTRIDIUM DIFFICILE BY PCR     Status: Normal   Collection Time   04-15-12  4:09 PM      Component Value Range Status Comment   C difficile by pcr NEGATIVE  NEGATIVE Final     Imaging: Dg Chest 2 View  2012/04/15  *RADIOLOGY REPORT*  Clinical Data: Chest pain  CHEST - 2 VIEW  Comparison: 03/02/2012  Findings: Normal heart size.  Clear lungs.  No pneumothorax.  No pleural effusion.  IMPRESSION: No active cardiopulmonary disease.   Original Report Authenticated By: Marybelle Killings, M.D.    US Renal  04-15-2012  *RADIOLOGY REPORT*  Clinical Data:  Acute renal failure  RENAL/URINARY TRACT ULTRASOUND COMPLETE  Comparison:  None.  Findings:  Right Kidney:  Right renal cysts noted. The largest is in the upper pole measuring 2.6 x 1.9 x 2.2 cm.  Normal in size and parenchymal echogenicity.  No evidence of mass or hydronephrosis.  Left Kidney:  Upper pole left renal cyst measures 2.2 x 1.6 x 3.0 cm.  Normal in size and parenchymal echogenicity.  No evidence of mass or hydronephrosis.  Bladder:  Appears normal for degree of bladder distention.  IMPRESSION: 1.  Bilateral renal cyst. 2.  No obstructive uropathy.   Original Report Authenticated By: Kerby Moors, M.D.       Medications:    Infusions:    . [EXPIRED]  sodium bicarbonate infusion 1000 mL 120 mL/hr at 04/14/12 1520     And  .  sodium bicarbonate infusion 1000 mL 150 mL/hr at 04/15/12 [id number]     Scheduled Medications:    . atorvastatin  20 mg Oral QHS  . feeding supplement  1 Container Oral TID BM  . heparin  5,000 Units Subcutaneous Q8H  . insulin aspart  0-9 Units Subcutaneous TID WC  . insulin glargine  10 Units Subcutaneous QHS  . levofloxacin  250 mg Oral Daily  .  loperamide  2 mg Oral QID  . paregoric  5 mL Oral BID  . sodium chloride  3 mL Intravenous Q12H     PRN Medications: acetaminophen, acetaminophen, [COMPLETED] dextrose, ondansetron (ZOFRAN) IV    Assessment/ Plan:    [name] is a 61 yo gentleman with a history of type 2 diabetes on insulin, invasive adenocarcinoma of colon s/p right hemicolectomy complicated by postop intra-abdominal abscess with a leaking anastamosis, OSA, hypertension, who presents with nausea and vomiting on HD#2 being treated for acute renal failure.   1. Acute renal failure - recurrent.  Creatinine is 11.4 and BUN is 85 today. AG 13. FEna <1 kidney failure likely prerenal due to hypovolemia.  - Will treat with 1/2 NS with 75 mEq bicarbonate at 120 cc per hour.  Follow Renal's recommendations on whether to dialyze or not.  - Strict I and O's, daily weights  - Zofran PRN  - US showed no obstruction, showed bilateral cysts  - Follow BMP for gap closure   2. Hyperkalemia: K now 4 -will treat IV fluids.  -will repeat BMP   3. Colon adenocarcinoma/High ileostomy output Wound looks good, 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 with liquid and solid stool.  Pt's with ileostomies can expect over 1000 mL of output a day and should increase their PO accordingly. - Ileostomy care - Follow CBC, vitals  - IV fluids and PO intake - Surgery has seen pt recommended Imodium and paregoric to help with increased output from ostomy - GI has been consulted  4. Nausea, vomiting and diarrhea: Pt has not vomited since admission - Will treat with Zofran for nausea.   5. Leukocytosis: WBC 9.2  today.   -will cont levaquin to treat as complicated UTI (14 day course, Day 2 of tx)   6. DM-II: Last A1c was 6.2 at 03/02/12. Patient has been on metformin and Novolin 70/30 at home. He doesn't regularly check his sugar but just takes medications. Sugars have been running low during admission - 50% dextrose bolus to correct acute hypoglycemia - hold metformin (pt is acidotic)  - Lantus 10u daily with correction scale.  - SSI.   7. HTN: Patient blood pressure is running low. Blood pressure is 92/59 on admission. Has remained low [id number] systolic [id number] diastolic  - Will hold home medication (Diovan) due to low Bp and renal failure.  - Will treat with IVF   8. PPX: heparin [place]   9. Diet:  - Renal diet   10. Dispo:  - Social work has met with him and while he is still adamant about not going to [place] he is amenable to an outside county SNF - Will likely dc to SNF once medically stable      Length of Stay: 2 days   This is a Careers information officer Note.  The care of the patient was discussed with Dr. Eulas Post and the assessment and plan formulated with their assistance.  Please see their attached note or addendum for official documentation of the daily encounter.

## 2012-04-15 NOTE — Progress Notes (Addendum)
Subjective: No complaints- pt states that he feels better today. Denies N/V/abdominal pain/dizziness.  Objective: Vital signs in last 24 hours: Filed Vitals:   04/14/12 1400 04/14/12 2257 04/15/12 0515 04/15/12 1014  BP: 90/55 91/44 100/63 102/59  Pulse: 82 90 79 80  Temp: 98.6 F (37 C) 98.2 F (36.8 C) 98.4 F (36.9 C) 98.1 F (36.7 C)  TempSrc: Oral Oral Oral Oral  Resp: 20 18 17 20   Height:      Weight:  203 lb 11.2 oz (92.398 kg)    SpO2: 98% 99% 100% 97%   Weight change: 7 lb 0.8 oz (3.198 kg)  Intake/Output Summary (Last 24 hours) at 04/15/12 1220 Last data filed at 04/15/12 0900  Gross per 24 hour  Intake    360 ml  Output   1500 ml  Net  -1140 ml   Vitals reviewed. General: Lying comfortably in bed, NAD HEENT: PERRL, EOMI, no scleral icterus Cardiac: RRR, no rubs, murmurs or gallops Pulm: Clear to auscultation bilaterally, no wheezes, rales, or rhonchi Abd: Soft, nontender, nondistended, BS present, abd wound healing nicely Ext: Warm and well perfused, no pedal edema Neuro: Alert and oriented X3, cranial nerves II-XII grossly intact, strength and sensation to light touch equal in bilateral upper and lower extremities Lab Results: Basic Metabolic Panel:  Lab [id number] 0600 04/14/12 2027  NA 133* 129*  K 4.0 4.5  CL 93* 90*  CO2 27 22  GLUCOSE 66* 85  BUN 85* 87*  CREATININE 11.43* 11.70*  CALCIUM 7.8* 7.9*  MG -- --  PHOS -- --   Liver Function Tests:  Lab 04/14/12 0610 04/13/12 1332  AST 32 36  ALT 25 31  ALKPHOS 156* 192*  BILITOT 0.8 0.8  PROT 7.1 8.3  ALBUMIN 2.6* 2.9*    Lab 04/13/12 1332  LIPASE 55  AMYLASE --   CBC:  Lab 04/15/12 0905 04/14/12 0610 04/13/12 1332  WBC 9.2 11.4* --  NEUTROABS -- -- 7.3  HGB 9.0* 9.6* --  HCT 25.9* 26.6* --  MCV 69.4* 70.6* --  PLT 443* 507* --   Cardiac Enzymes:  Lab 04/13/12 1332  CKTOTAL --  CKMB --  CKMBINDEX --  TROPONINI <0.30   CBG:  Lab 04/15/12 1207 04/15/12 0846 04/15/12 0757  04/14/12 2213 04/14/12 1616 04/14/12 1206  GLUCAP 113* 119* 69* 90 80 96   Coagulation:  Lab 04/14/12 0610 04/13/12 1332  LABPROT 15.5* 14.6  INR 1.25 1.16   Urinalysis:  Lab 04/13/12 1547  COLORURINE AMBER*  LABSPEC 1.029  PHURINE 5.0  GLUCOSEU NEGATIVE  HGBUR NEGATIVE  BILIRUBINUR SMALL*  KETONESUR 15*  PROTEINUR >300*  UROBILINOGEN 1.0  NITRITE NEGATIVE  LEUKOCYTESUR SMALL*    Micro Results: Recent Results (from the past 240 hour(s))  URINE CULTURE     Status: Normal   Collection Time   04/13/12  3:47 PM      Component Value Range Status Comment   Specimen Description URINE, CLEAN CATCH   Final    Special Requests NONE   Final    Culture  Setup Time 04/13/2012 16:47   Final    Colony Count NO GROWTH   Final    Culture NO GROWTH   Final    Report Status 04/14/2012 FINAL   Final   CLOSTRIDIUM DIFFICILE BY PCR     Status: Normal   Collection Time   04/13/12  4:09 PM      Component Value Range Status Comment   C difficile by  pcr NEGATIVE  NEGATIVE Final    Studies/Results: Dg Chest 2 View  04/13/2012  *RADIOLOGY REPORT*  Clinical Data: Chest pain  CHEST - 2 VIEW  Comparison: 03/02/2012  Findings: Normal heart size.  Clear lungs.  No pneumothorax.  No pleural effusion.  IMPRESSION: No active cardiopulmonary disease.   Original Report Authenticated By: Marybelle Killings, M.D.    US Renal  04/13/2012  *RADIOLOGY REPORT*  Clinical Data:  Acute renal failure  RENAL/URINARY TRACT ULTRASOUND COMPLETE  Comparison:  None.  Findings:  Right Kidney:  Right renal cysts noted. The largest is in the upper pole measuring 2.6 x 1.9 x 2.2 cm.  Normal in size and parenchymal echogenicity.  No evidence of mass or hydronephrosis.  Left Kidney:  Upper pole left renal cyst measures 2.2 x 1.6 x 3.0 cm.  Normal in size and parenchymal echogenicity.  No evidence of mass or hydronephrosis.  Bladder:  Appears normal for degree of bladder distention.  IMPRESSION: 1.  Bilateral renal cyst. 2.  No  obstructive uropathy.   Original Report Authenticated By: Kerby Moors, M.D.    Medications: I have reviewed the patient's current medications. Scheduled Meds:    . atorvastatin  20 mg Oral QHS  . feeding supplement  1 Container Oral TID BM  . heparin  5,000 Units Subcutaneous Q8H  . insulin aspart  0-9 Units Subcutaneous TID WC  . insulin glargine  10 Units Subcutaneous QHS  . levofloxacin  250 mg Oral Daily  . loperamide  2 mg Oral QID  . paregoric  5 mL Oral BID  . sodium chloride  3 mL Intravenous Q12H   Continuous Infusions:    . [EXPIRED]  sodium bicarbonate infusion 1000 mL 120 mL/hr at 04/14/12 1520   And  .  sodium bicarbonate infusion 1000 mL 150 mL/hr at 04/15/12 0833   PRN Meds:.acetaminophen, acetaminophen, ondansetron (ZOFRAN) IV  Assessment/Plan: Mr. Kliethermes is a 61 yo gentleman with a history of type 2 diabetes on insulin, invasive adenocarcinoma of colon s/p right hemicolectomy complicated by postop intra-abdominal abscess with a leaking anastamosis, OSA, hypertension, who presents with nausea, vomiting, and dehydration resulting in reoccurrence of acute renal failure.   1. Acute renal failure: Recurrent. On admission, creatinine was 11.2 and BUN 84. Likely secondary to extreme volume depletion in the setting of nausea, vomiting, increased ileostomy output. In his previous admission, his BUN was 156 and creatinine 17. Bicarbonate was 16 and potassium 6.5. It was likely related to his IV contrast used for abdominal CT at last admission. He was treated with IV fluids and left the hospital on 04/05/12 with a creatinine of 3.6. Of note, the patient's creatinine was 1.0 on 03/09/12. On this admission, the patient was a severely volume depleted and acidotic with an anion gap of 18.  Metabolic acidosis likely due to renal failure secondary to dehydration. Renal U/S showed no obstruction, bilateral renal cysts noted. Renal was consulted, Dr. Hassell Done evaluated the patient and suggested  treating with volume resuscitation and alkalai therapy. Appreciate Renal evaluation and recommendations. Per Renal recs, he was started on 1/2 NS with 75 mEq bicarbonate at 250 cc an hour x 8 hours then 120 cc per hour. His AG yetserday morning was still elevated at 17, and we continued the fluids per Renal. Today his AG is 13. FeNa <<< 1.0. Renal recommends continuing the fluids; no HD at this time. - Continue 1/2NS w/ 44mEq bicarb @ 120cc/hr  - Strict I and O's, daily weights  -  Zofran PRN  - Follow BMP  - F/u with Renal  2. Hyperkalemia: Likely 2/2 copious diarrhea since discharge. K was 5.4 on admission without EKG changes. Patient received 1 dose of Kayexalate, 15 g in ED. On repeat BMP, potassium 4.9, 4.0 this morning. Will continue to monitor. - am BMP   Lab 04/15/12 0600 04/14/12 2027 04/14/12 0610 04/13/12 2344 04/13/12 1332  K 4.0 4.5 4.4 4.9 5.4*    3. 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 S/p prior wound vac in place which was removed on 03/10/12. His wound was healing nicely at previous d/c, and continues to heal well.  At this admission, his ileostomy site appears clear, with liquid output. He previous CT scan abd/pelvis showed inflammation of omental fat, no focal abscess noted, no SBO. Pt with abdominal pain to deep palpation around the incision, no rebound or guarding. Does not appear septic. He had a f/u appt with Surgery, Dr. [name] today. I contacted his office to notify Dr. [name] that Mr. Howenstine was readmitted to the hospital.  - Ileostomy care - Follow CBC, vitals    4. Nausea, vomiting and diarrhea: Patient has been on Levaquin recently, which puts patient at risk of getting C. difficile colitis. Patient had elevated lipase in the past,143 on  03/29/12. Lipase 55 on admission. C diff PCR negative. Tolerating diet, but still with limited po intake. Will consider PICC for IVF for d/c. Dr. [name] saw the pt and recommended GI consult for the watery output from his ostomy. He recommended and started Paregoric to help reduce motility. - Continue Zofran for nausea.  - Renal diet - F/u GI  5. Leukocytosis: Resolved. WBC 13 on admission, 9.2 today. Hx of UTI during last admission but pt only received 4 doses of 14 of Levaquin therapy b/c he couldn't afford the medication after discharge. Patient specifically denies dysuria, fever, chills. Still has a leukocytosis, may be from uncleared UTI, stress reaction, AKI. Urine cx grew pan sensitive E. coli at last admission. C. Diff, stool cx, and urine cx negative. Continuing Levaquin to treat as complicated UTI in a male, day 2. - Continue Levaquin - am CBC  Lab 04/15/12 0905 04/14/12 0610 04/13/12 1332  WBC 9.2 11.4* 13.0*     6. DM-II: Last A1c was 6.2 at 03/02/12. Patient has been on Metformin and Novolin 70/30 at home. CBGs stable. - Holding Metformin as pt with metabolic acidosis and renal failure  - Lantus 10u daily.  - SSI.   7. HTN: Hypotensive on admission with blood pressure of 92/59. Inproving with IVF and inreased po intake. Today 102/59. - Continue IVF   8. PPX: [place] Heparin     LOS: 2 days   Otho Bellows 04/15/2012, 12:20 PM

## 2012-04-15 NOTE — Significant Event (Signed)
CBG: 66  Treatment: 15 GM carbohydrate snack  Symptoms: None  Follow-up CBG: Time:0820 CBG Result:119  Possible Reasons for Event: unknown  Jobe Igo RN

## 2012-04-15 NOTE — Progress Notes (Signed)
Clinical Social Work-CSW continues to follow-at this time has one possible offer however this is SNF that pt declined upon prior admission will discuss with treatment team and pursue placement- Gerre Scull, [phone]

## 2012-04-15 NOTE — Progress Notes (Signed)
Resident Co-sign Daily Note: I have seen the patient and reviewed the daily progress note by Elmer Sow, MS- III and discussed the care of the patient with him.  See my separate note for documentation of my findings, assessment, and plans.    LOS: 2 days   Otho Bellows 04/15/2012, 12:18 PM

## 2012-04-15 NOTE — Progress Notes (Signed)
Resident Co-sign Daily Note:  I have seen the patient and reviewed the daily progress note by Elmer Sow, MS- III and discussed the care of the patient with him.  See my separate note for documentation of my findings, assessment, and plans.    LOS: 2 days   Otho Bellows 04/15/2012, 12:19 PM

## 2012-04-16 LAB — GLUCOSE, CAPILLARY
Glucose-Capillary: 101 mg/dL — ABNORMAL HIGH (ref 70–99)
Glucose-Capillary: 139 mg/dL — ABNORMAL HIGH (ref 70–99)
Glucose-Capillary: 89 mg/dL (ref 70–99)
Glucose-Capillary: 107 mg/dL — ABNORMAL HIGH (ref 70–99)

## 2012-04-16 LAB — RENAL FUNCTION PANEL
Albumin: 2.2 g/dL — ABNORMAL LOW (ref 3.5–5.2)
BUN: 73 mg/dL — ABNORMAL HIGH (ref 6–23)
CO2: 23 mEq/L (ref 19–32)
Calcium: 7.3 mg/dL — ABNORMAL LOW (ref 8.4–10.5)
Chloride: 97 mEq/L (ref 96–112)
Creatinine, Ser: 8.97 mg/dL — ABNORMAL HIGH (ref 0.50–1.35)
GFR calc Af Amer: 7 mL/min — ABNORMAL LOW (ref >90)
GFR calc non Af Amer: 6 mL/min — ABNORMAL LOW (ref >90)
Glucose, Bld: 99 mg/dL (ref 70–99)
Phosphorus: 5.3 mg/dL — ABNORMAL HIGH (ref 2.3–4.6)
Potassium: 4 mEq/L (ref 3.5–5.1)
Sodium: 131 mEq/L — ABNORMAL LOW (ref 135–145)

## 2012-04-16 NOTE — Progress Notes (Signed)
Internal Medicine Attending  Date: 04/16/2012  Patient name: [name] Medical record number: [id number] Date of birth: [birth year] Age: 61 y.o. Gender: male  I saw and evaluated the patienton a.m. rounds with house staff. See the note by resident Dr. Eulas Post for details of clinical findings and plans.  Patient's urine output has improved, and his creatinine is improving; he feels that his ostomy output is decreasing to some extent.   I agree with plans as outlined in the note by resident Dr. Eulas Post.

## 2012-04-16 NOTE — Progress Notes (Signed)
[facility] Daily Rounding Note 04/16/2012, 8:49 AM  SUBJECTIVE:       975 ml stool 11/13, 250 ml thus far today. Says stool was less watery yesterday afternoon and evening but it is more watery this AM, before breakfast, of which he ate most. No belly pain or nausea.  One dose of the BID Questran given thus far. Has a sense of a vacuum sucking up his lower belly and low back. No severe pain.  Has not been walking in hall  OBJECTIVE:         Vital signs in last 24 hours:    Temp:  [97.4 F (36.3 C)-98.1 F (36.7 C)] 97.4 F (36.3 C) (11/14 0518) Pulse Rate:  [79-88] 81  (11/14 0518) Resp:  [18-20] 19  (11/14 0518) BP: (88-108)/(49-61) 105/61 mmHg (11/14 0518) SpO2:  [96 %-100 %] 100 % (11/14 0518) Weight:  [94.847 kg (209 lb 1.6 oz)] 94.847 kg (209 lb 1.6 oz) (11/13 2116) Last BM Date: 04/15/12 General: looks well   Heart: RRR Chest: clear B.  No resp distress Abdomen: soft, NT, ND.  BS quiet.  Ostomy effluent is watery but with more fibrous material seen than yesterday afternoon.  Lap wound bandaged  Extremities: no pedal edema Neuro/Psych:  Pleasant, relaxed, no confusion.  Intake/Output from previous day: 11/13 0701 - 11/14 0700 In: 3352.5 [P.O.:840; I.V.:2512.5] Out: 3025 [Urine:2050; Stool:975]  Intake/Output this shift: Total I/O In: -  Out: 250 [Stool:250]  Lab Results:  [facility] 04/15/12 0905 04/14/12 0610 04/13/12 1332  WBC 9.2 11.4* 13.0*  HGB 9.0* 9.6* 11.1*  HCT 25.9* 26.6* 32.0*  PLT 443* 507* 562*   BMET  Basename 04/16/12 0555 04/15/12 0600 04/14/12 2027  NA 131* 133* 129*  K 4.0 4.0 4.5  CL 97 93* 90*  CO2 23 27 22   GLUCOSE 99 66* 85  BUN 73* 85* 87*  CREATININE 8.97* 11.43* 11.70*  CALCIUM 7.3* 7.8* 7.9*   LFT  Basename 04/16/12 0555 04/14/12 0610 04/13/12 1332  PROT -- 7.1 8.3  ALBUMIN 2.2* 2.6* 2.9*  AST -- 32 36  ALT -- 25 31  ALKPHOS -- 156* 192*  BILITOT -- 0.8 0.8  BILIDIR -- -- --  IBILI -- -- --   PT/INR  Basename  04/14/12 0610 04/13/12 1332  LABPROT 15.5* 14.6  INR 1.25 1.16    ASSESMENT: * High ouput from ileostomy. Nausea, vomitting and subsequent dehydration. C dif PCR negative. Stool clx in process.  * Recurrent ARF, limited dialysis during most recent admission. BUN/creat improved today. Oliguria resolved.  * S/p right hemicolectomy with small amount of terminal ileum seen  *  UTI. Note that the specimen of 11/11 was poor (many squams, only 0-2 WBCs, few bacteria) .  Urine clx has not grown anything.  Still on levaquin. * Hyponatremia.  *  Normocytic anemia.  * IDDM   PLAN: *  Continue the Imodium, Questran, Protonix, IVF (at 150/hour currently) *  Would stop the Levaquin, his E Coli UTI from last admission is resolved.  *  Follow labs.    LOS: 3 days   Azucena Freed  04/16/2012, 8:49 AM Pager: [phone]   GI ATTENDING  Patient seen and examined. Interval laboratories reviewed. Some improvement since admission. Exam unchanged. At this point, he should not go home until he demonstrates the ability to maintain adequate oral intake and be able to keep up with reasonable ostomy losses. Continue current care plan and monitoring of intake, output,  and electrolytes.  Docia Chuck. Geri Seminole., M.D. [facility] Division of Gastroenterology

## 2012-04-16 NOTE — Progress Notes (Signed)
Inpatient Diabetes Program Recommendations  AACE/ADA: New Consensus Statement on Inpatient Glycemic Control (2013)  Target Ranges:  Prepandial:   less than 140 mg/dL      Peak postprandial:   less than 180 mg/dL (1-2 hours)      Critically ill patients:  140 - 180 mg/dL   Results for Rodney, [name] (MRN [id number]) as of 04/16/2012 08:47  Ref. Range 04/14/2012 07:48 04/14/2012 08:45 04/14/2012 11:44 04/14/2012 12:06 04/14/2012 16:16 04/14/2012 22:13  Glucose-Capillary Latest Range: 70-99 mg/dL 57 (L) 99 55 (L) 96 80 90   Results for [name], [name] (MRN [id number]) as of 04/16/2012 08:47  Ref. Range 04/15/2012 07:57 04/15/2012 08:46 04/15/2012 12:07 04/15/2012 16:49 04/15/2012 21:06  Glucose-Capillary Latest Range: 70-99 mg/dL 69 (L) 119 (H) 113 (H) 96 93     Lantus insulin NOT given the last two nights.    Inpatient Diabetes Program Recommendations Insulin - Basal: Please place Lantus 10 units QHS on hold for now.  Note: Will follow. Wyn Quaker RN, MSN, CDE Diabetes Coordinator Inpatient Diabetes Program [phone]

## 2012-04-16 NOTE — Progress Notes (Signed)
  Subjective: Patient feeling much better - wants to go home Ostomy output - greenish; thicker   Objective: Vital signs in last 24 hours: Temp:  [97.4 F (36.3 C)-98.2 F (36.8 C)] 98.2 F (36.8 C) (11/14 1334) Pulse Rate:  [76-81] 78  (11/14 1334) Resp:  [18-20] 18  (11/14 1334) BP: (88-108)/(49-61) 99/58 mmHg (11/14 1334) SpO2:  [99 %-100 %] 100 % (11/14 1334) Weight:  [209 lb 1.6 oz (94.847 kg)] 209 lb 1.6 oz (94.847 kg) (11/13 2116) Last BM Date: 04/16/12  Intake/Output from previous day: 11/13 0701 - 11/14 0700 In: 3352.5 [P.O.:840; I.V.:2512.5] Out: 3025 [Urine:2050; Stool:975] Intake/Output this shift: Total I/O In: 720 [P.O.:720] Out: 2600 [Urine:1300; Stool:1300]  General appearance: alert, cooperative and no distress GI: soft, non-tender; bowel sounds normal; no masses,  no organomegaly Wound - sutures trimmed; clean; well-granulated; minimal drainage  Lab Results:   [facility] 04/15/12 0905 04/14/12 0610  WBC 9.2 11.4*  HGB 9.0* 9.6*  HCT 25.9* 26.6*  PLT 443* 507*   BMET  Basename 04/16/12 0555 04/15/12 0600  NA 131* 133*  K 4.0 4.0  CL 97 93*  CO2 23 27  GLUCOSE 99 66*  BUN 73* 85*  CREATININE 8.97* 11.43*  CALCIUM 7.3* 7.8*   PT/INR  Basename 04/14/12 0610  LABPROT 15.5*  INR 1.25   ABG No results found for this basename: PHART:2,PCO2:2,PO2:2,HCO3:2 in the last 72 hours  Studies/Results: No results found.  Anti-infectives: Anti-infectives     Start     Dose/Rate Route Frequency Ordered Stop   04/13/12 1845   levofloxacin (LEVAQUIN) tablet 250 mg  Status:  Discontinued        250 mg Oral Daily 04/13/12 1948 04/16/12 1039          Assessment/Plan: s/p * No surgery found * No acute surgical issues.  Appreciate GI help with his motility issues.  Medical management of his fluid balance.  LOS: 3 days    [name] K. 04/16/2012

## 2012-04-16 NOTE — Progress Notes (Signed)
Patient ID: [name], male   DOB: 10-Jul-1950, 61 y.o.   MRN: [id number] S:feels rough due to uncomfortable bed O:BP 105/61  Pulse 81  Temp 97.4 F (36.3 C) (Oral)  Resp 19  Ht 6' (1.829 m)  Wt 94.847 kg (209 lb 1.6 oz)  BMI 28.36 kg/m2  SpO2 100%  Intake/Output Summary (Last 24 hours) at 04/16/12 0911 Last data filed at 04/16/12 0758  Gross per 24 hour  Intake 3112.5 ml  Output   3025 ml  Net   87.5 ml   Intake/Output: I/O last 3 completed shifts: In: 3352.5 [P.O.:840; I.V.:2512.5] Out: 3725 [Urine:2050; [id number]  Intake/Output this shift:  Total I/O In: -  Out: 250 [Stool:250] Weight change: 2.449 kg (5 lb 6.4 oz) Gen:WD WN AAM in NAD CVS:no rub Resp:CTA [id number] in RLQ Ext:no edema   Lab 04/16/12 0555 04/15/12 0600 04/14/12 2027 04/14/12 0610 04/13/12 2344 04/13/12 1332  NA 131* 133* 129* 130* 130* 129*  K 4.0 4.0 4.5 4.4 4.9 5.4*  CL 97 93* 90* 92* 92* 92*  CO2 23 27 22 21 21 19   GLUCOSE 99 66* 85 62* 65* 84  BUN 73* 85* 87* 85* 84* 84*  CREATININE 8.97* 11.43* 11.70* 11.83* 11.53* 11.24*  ALBUMIN 2.2* -- -- 2.6* -- 2.9*  CALCIUM 7.3* 7.8* 7.9* 8.5 8.8 9.5  PHOS 5.3* -- -- -- -- --  AST -- -- -- 32 -- 36  ALT -- -- -- 25 -- 31   Liver Function Tests:  Lab 04/16/12 0555 04/14/12 0610 04/13/12 1332  AST -- 32 36  ALT -- 25 31  ALKPHOS -- 156* 192*  BILITOT -- 0.8 0.8  PROT -- 7.1 8.3  ALBUMIN 2.2* 2.6* 2.9*    Lab 04/13/12 1332  LIPASE 55  AMYLASE --   No results found for this basename: AMMONIA:3 in the last 168 hours CBC:  Lab 04/15/12 0905 04/14/12 0610 04/13/12 1332  WBC 9.2 11.4* 13.0*  NEUTROABS -- -- 7.3  HGB 9.0* 9.6* 11.1*  HCT 25.9* 26.6* 32.0*  MCV 69.4* 70.6* 71.1*  PLT 443* 507* 562*   Cardiac Enzymes:  Lab 04/13/12 1332  CKTOTAL --  CKMB --  CKMBINDEX --  TROPONINI <0.30   CBG:  Lab 04/16/12 0748 04/15/12 2106 04/15/12 1649 04/15/12 1207 04/15/12 0846  GLUCAP 101* 93 96 113* 119*    Iron Studies: No  results found for this basename: IRON,TIBC,TRANSFERRIN,FERRITIN in the last 72 hours Studies/Results: No results found.    Marland Kitchen atorvastatin  20 mg Oral QHS  . cholestyramine light  4 g Oral Custom  . feeding supplement  1 Container Oral TID BM  . heparin  5,000 Units Subcutaneous Q8H  . insulin aspart  0-9 Units Subcutaneous TID WC  . levofloxacin  250 mg Oral Daily  . loperamide  2 mg Oral QID  . pantoprazole  40 mg Oral Q0600  . sodium chloride  3 mL Intravenous Q12H  . [DISCONTINUED] insulin glargine  10 Units Subcutaneous QHS  . [DISCONTINUED] paregoric  5 mL Oral BID    BMET    Component Value Date/Time   NA 131* 04/16/2012 0555   K 4.0 04/16/2012 0555   CL 97 04/16/2012 0555   CO2 23 04/16/2012 0555   GLUCOSE 99 04/16/2012 0555   BUN 73* 04/16/2012 0555   CREATININE 8.97* 04/16/2012 0555   CREATININE 1.12 07/04/2011 1516   CALCIUM 7.3* 04/16/2012 0555   GFRNONAA 6* 04/16/2012 0555   GFRAA 7* 04/16/2012  0555   CBC    Component Value Date/Time   WBC 9.2 04/15/2012 0905   RBC 3.73* 04/15/2012 0905   HGB 9.0* 04/15/2012 0905   HCT 25.9* 04/15/2012 0905   PLT 443* 04/15/2012 0905   MCV 69.4* 04/15/2012 0905   MCH 24.1* 04/15/2012 0905   MCHC 34.7 04/15/2012 0905   RDW 18.6* 04/15/2012 0905   LYMPHSABS 4.8* 04/13/2012 1332   MONOABS 0.7 04/13/2012 1332   EOSABS 0.1 04/13/2012 1332   BASOSABS 0.0 04/13/2012 1332    Assessment/Plan:  1. Acute renal failure/acute kidney injury, oliguric-- (Creatinine was 1.0 on 03/09/12). Finally starting to improve. Continue IVF's and continue with volume resuscitation, UOP and daily Scr. No acute indication for dialysis at this time. Will cont to follow 2. Diarrhea per ostomy--Imodium has been given. Stool (for C. difficile and culture) has been ordered. He was on Levaquin prior to admission  3. Prior history diabetes mellitus-with his second episode of acute renal failure in less than one month it is advisable to avoid metformin from  here on as well as ACE/ARB until renal function and diarrhea stabilize.  4. Prior history hypertension-is not hypertensive now. He had been on valsartan prior to admission. This should be avoided for now  5. Right hemicolectomy/anastomotic leak/intra-abdominal abscess-S./P. surgical correction(s). Surgery should be informed of his admission. They may have some insight into why he has had such severe diarrhea causing 2 admissions in 2 weeks. GI eval may also be helpful in slowing fecal loss. Need to r/o C. Diff etc. 6. Metabolic acidosis- resolved. Switch to NS 7. Hyperkalemia- resolved 8. dispo- per primary svc 9. Would not resume ACE/ARB or metformin upon discharge due to risks outweighing benefits.  [place] A

## 2012-04-16 NOTE — Progress Notes (Addendum)
Subjective: No complaints- pt states that he feels better today. Denies N/V/abdominal pain/dizziness.  Objective: Vital signs in last 24 hours: Filed Vitals:   04/15/12 1439 04/15/12 1721 04/15/12 2116 04/16/12 0518  BP: 90/52 108/61 88/49 105/61  Pulse: 88 79 80 81  Temp: 97.6 F (36.4 C)  98.1 F (36.7 C) 97.4 F (36.3 C)  TempSrc: Oral  Oral Oral  Resp: 18 20 18 19   Height:      Weight:   209 lb 1.6 oz (94.847 kg)   SpO2: 96% 99% 100% 100%   Weight change: 5 lb 6.4 oz (2.449 kg)  Intake/Output Summary (Last 24 hours) at 04/16/12 0904 Last data filed at 04/16/12 0758  Gross per 24 hour  Intake 3112.5 ml  Output   3025 ml  Net   87.5 ml   Vitals reviewed. General: Lying comfortably in bed, NAD HEENT: PERRL, EOMI, no scleral icterus Cardiac: RRR, no rubs, murmurs or gallops Pulm: Clear to auscultation bilaterally, no wheezes, rales, or rhonchi Abd: Soft, nontender, nondistended, BS present, abd wound healing nicely Ext: Warm and well perfused, no pedal edema Neuro: Alert and oriented X3, cranial nerves II-XII grossly intact, strength and sensation to light touch equal in bilateral upper and lower extremities  Lab Results: Basic Metabolic Panel:  Lab [id number] 0555 04/15/12 0600  NA 131* 133*  K 4.0 4.0  CL 97 93*  CO2 23 27  GLUCOSE 99 66*  BUN 73* 85*  CREATININE 8.97* 11.43*  CALCIUM 7.3* 7.8*  MG -- --  PHOS 5.3* --   Liver Function Tests:  Lab 04/16/12 0555 04/14/12 0610 04/13/12 1332  AST -- 32 36  ALT -- 25 31  ALKPHOS -- 156* 192*  BILITOT -- 0.8 0.8  PROT -- 7.1 8.3  ALBUMIN 2.2* 2.6* --    Lab 04/13/12 1332  LIPASE 55  AMYLASE --   CBC:  Lab 04/15/12 0905 04/14/12 0610 04/13/12 1332  WBC 9.2 11.4* --  NEUTROABS -- -- 7.3  HGB 9.0* 9.6* --  HCT 25.9* 26.6* --  MCV 69.4* 70.6* --  PLT 443* 507* --   Cardiac Enzymes:  Lab 04/13/12 1332  CKTOTAL --  CKMB --  CKMBINDEX --  TROPONINI <0.30   CBG:  Lab 04/16/12 0748 04/15/12 2106  04/15/12 1649 04/15/12 1207 04/15/12 0846 04/15/12 0757  GLUCAP 101* 93 96 113* 119* 69*   Coagulation:  Lab 04/14/12 0610 04/13/12 1332  LABPROT 15.5* 14.6  INR 1.25 1.16   Urinalysis:  Lab 04/13/12 1547  COLORURINE AMBER*  LABSPEC 1.029  PHURINE 5.0  GLUCOSEU NEGATIVE  HGBUR NEGATIVE  BILIRUBINUR SMALL*  KETONESUR 15*  PROTEINUR >300*  UROBILINOGEN 1.0  NITRITE NEGATIVE  LEUKOCYTESUR SMALL*    Micro Results: Recent Results (from the past 240 hour(s))  URINE CULTURE     Status: Normal   Collection Time   04/13/12  3:47 PM      Component Value Range Status Comment   Specimen Description URINE, CLEAN CATCH   Final    Special Requests NONE   Final    Culture  Setup Time 04/13/2012 16:47   Final    Colony Count NO GROWTH   Final    Culture NO GROWTH   Final    Report Status 04/14/2012 FINAL   Final   CLOSTRIDIUM DIFFICILE BY PCR     Status: Normal   Collection Time   04/13/12  4:09 PM      Component Value Range Status Comment  C difficile by pcr NEGATIVE  NEGATIVE Final    Studies/Results: No results found. Medications: I have reviewed the patient's current medications. Scheduled Meds:    . atorvastatin  20 mg Oral QHS  . cholestyramine light  4 g Oral Custom  . feeding supplement  1 Container Oral TID BM  . heparin  5,000 Units Subcutaneous Q8H  . insulin aspart  0-9 Units Subcutaneous TID WC  . levofloxacin  250 mg Oral Daily  . loperamide  2 mg Oral QID  . pantoprazole  40 mg Oral Q0600  . sodium chloride  3 mL Intravenous Q12H  . [DISCONTINUED] insulin glargine  10 Units Subcutaneous QHS  . [DISCONTINUED] paregoric  5 mL Oral BID   Continuous Infusions:    . sodium chloride 150 mL/hr at 04/16/12 0340  . [DISCONTINUED]  sodium bicarbonate infusion 1000 mL 150 mL/hr at 04/15/12 0833   PRN Meds:.acetaminophen, acetaminophen, ondansetron (ZOFRAN) IV  Assessment/Plan: Mr. 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   1. Acute renal failure: Recurrent. On admission, creatinine was 11.2 and BUN 84. Likely secondary to extreme volume depletion in the setting of nausea, vomiting, increased ileostomy output. In his previous admission, his BUN was 156 and creatinine 17. Bicarbonate was 16 and potassium 6.5. It was likely related to his IV contrast used for abdominal CT at last admission. He was treated with IV fluids and left the hospital on 04/05/12 with a creatinine of 3.6. Of note, the patient's creatinine was 1.0 on 03/09/12. On this admission, the patient was a severely volume depleted and acidotic with an anion gap of 18.  Metabolic acidosis likely due to renal failure secondary to dehydration. Renal U/S showed no obstruction, bilateral renal cysts noted. Renal was consulted, Dr. Hassell Done evaluated the patient and suggested treating with volume resuscitation and alkalai therapy. Appreciate Renal evaluation and recommendations. Per Renal recs, he was started on 1/2 NS with 75 mEq bicarbonate at 250 cc an hour x 8 hours then 120 cc per hour. His AG on HD #2 was still elevated at 17, and we continued the fluids per Renal. Today his AG has closed and is 11. FeNa <<< 1.0. Renal recommends continuing the fluids; no HD. UOP improving daily. - Continue 1/2NS w/ 7mEq bicarb @ 120cc/hr  - Strict I and O's, daily weights  - Zofran PRN  - Follow BMP  - F/u with Renal  2. Hyperkalemia:  K was 5.4 on admission without EKG changes. Patient received 1 dose of Kayexalate, 15 g in ED. On repeat BMP, potassium 4.9, 4.0 this morning. Will continue to monitor. - am BMP   Lab 04/16/12 0555 04/15/12 0600 04/14/12 2027 04/14/12 0610 04/13/12 2344  K 4.0 4.0 4.5 4.4 4.9    3. 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 S/p prior wound vac in place which was removed on 03/10/12. His wound was healing nicely at previous d/c, and continues to heal well.  At this admission, his ileostomy site appears clear, with liquid output. He previous CT scan abd/pelvis showed inflammation of omental fat, no focal  abscess noted, no SBO. Pt with abdominal pain to deep palpation around the incision, no rebound or guarding. Does not appear septic. Dr. [name] saw the pt in the hospital and felt the wound was healing well. - Continue Ileostomy care - Follow CBC, vitals    4. Nausea, vomiting and diarrhea: Patient has been on Levaquin recently, which puts patient at risk of getting C. difficile colitis, but Cdiff PCR negative. Patient had elevated lipase in the past,143 on 03/29/12. Lipase 55 on admission. Tolerating diet, but still with limited po intake, but improving. Will consider PICC for IVF for d/c. Dr. [name] saw the pt and recommended GI consult for the watery output from his ostomy. GI recommended to continue PPI, IVF, Imodium, and started Questran. Since beginning the Questran, his ostomy output has thickened up somewhat. Will continue to monitor. - Continue Zofran for nausea.  - Renal diet - F/u GI  5. Leukocytosis: Resolved. WBC 13 on admission, 9.2 at last check. Hx of UTI during last admission but pt only received 4 doses of 14 of Levaquin therapy b/c he couldn't afford the medication after discharge. Patient specifically denies dysuria, fever, chills. Still has a leukocytosis, may have been from uncleared UTI, stress reaction, AKI. Urine cx grew pan sensitive E. coli at last admission. C. Diff, stool cx, and urine cx negative. Continued Levaquin to treat as  complicated UTI in a male, day 3. Stopping Levaquin, as Ucx neg. - am CBC  Lab 04/15/12 0905 04/14/12 0610 04/13/12 1332  WBC 9.2 11.4* 13.0*    6. DM-II: Last A1c was 6.2 at 03/02/12. Patient has been on Metformin and Novolin 70/30 at home. Holding Metformin as pt with metabolic acidosis and renal failure.  Placed on Lantus 10u with SSI. Lantus held over night and this morning. CBGs stable. Will stop Lantus. Will continue SSI. - Continue SSI  Lab 04/16/12 0748 04/15/12 2106 04/15/12 1649 04/15/12 1207 04/15/12 0846  GLUCAP 101* 93 96 113* 119*    7. HTN: Hypotensive on admission with blood pressure of 92/59. Improving with IVF and inceased po intake. Today 100/61. - Continue IVF   8. PPX: [place] Heparin     LOS: 3 days   Otho Bellows 04/16/2012, 9:04 AM

## 2012-04-17 LAB — GLUCOSE, CAPILLARY
Glucose-Capillary: 60 mg/dL — ABNORMAL LOW (ref 70–99)
Glucose-Capillary: 109 mg/dL — ABNORMAL HIGH (ref 70–99)
Glucose-Capillary: 138 mg/dL — ABNORMAL HIGH (ref 70–99)
Glucose-Capillary: 110 mg/dL — ABNORMAL HIGH (ref 70–99)
Glucose-Capillary: 121 mg/dL — ABNORMAL HIGH (ref 70–99)
Glucose-Capillary: 93 mg/dL (ref 70–99)

## 2012-04-17 LAB — RENAL FUNCTION PANEL
Albumin: 2.2 g/dL — ABNORMAL LOW (ref 3.5–5.2)
BUN: 58 mg/dL — ABNORMAL HIGH (ref 6–23)
CO2: 22 mEq/L (ref 19–32)
Calcium: 7.5 mg/dL — ABNORMAL LOW (ref 8.4–10.5)
Chloride: 106 mEq/L (ref 96–112)
Creatinine, Ser: 6.34 mg/dL — ABNORMAL HIGH (ref 0.50–1.35)
GFR calc Af Amer: 10 mL/min — ABNORMAL LOW (ref >90)
GFR calc non Af Amer: 9 mL/min — ABNORMAL LOW (ref >90)
Glucose, Bld: 88 mg/dL (ref 70–99)
Phosphorus: 4.4 mg/dL (ref 2.3–4.6)
Potassium: 4.4 mEq/L (ref 3.5–5.1)
Sodium: 136 mEq/L (ref 135–145)

## 2012-04-17 NOTE — Progress Notes (Signed)
Agree with notes from Renal and GI.  As I suggested earlier, perhaps a PICC line with home health IV hydration might help prevent further episodes of renal failure due to dehydration.  Also, I had ordered Paregoric earlier to try to slow down his ostomy output, but the ordered was D/C'ed by the primary team.  Consider trial of Paregoric to slow down his output.  Will recheck him on Monday if he is still here.  Imogene Burn. [name], MD, [facility]  04/17/2012 12:18 PM

## 2012-04-17 NOTE — Progress Notes (Signed)
Patient ID: [name], male   DOB: 1951/04/22, 61 y.o.   MRN: [id number] S:no complaints O:BP 115/63  Pulse 76  Temp 98.4 F (36.9 C) (Oral)  Resp 18  Ht 6' (1.829 m)  Wt 95.618 kg (210 lb 12.8 oz)  BMI 28.59 kg/m2  SpO2 100%  Intake/Output Summary (Last 24 hours) at 04/17/12 1016 Last data filed at 04/17/12 0923  Gross per 24 hour  Intake   4290 ml  Output   4400 ml  Net   -110 ml   Intake/Output: I/O last 3 completed shifts: In: 7042.5 [P.O.:1080; I.V.:5962.5] Out: 6800 [Urine:4550; Stool:2250]  Intake/Output this shift:  Total I/O In: 480 [P.O.:480] Out: 850 [Urine:700; Stool:150] Weight change: 0.771 kg (1 lb 11.2 oz) Gen:WD WN AAM in NAD CVS:no rub Resp:CTA Abd:+colostomy Ext:no edema   Lab 04/17/12 0625 04/16/12 0555 04/15/12 0600 04/14/12 2027 04/14/12 0610 04/13/12 2344 04/13/12 1332  NA 136 131* 133* 129* 130* 130* 129*  K 4.4 4.0 4.0 4.5 4.4 4.9 5.4*  CL 106 97 93* 90* 92* 92* 92*  CO2 22 23 27 22 21 21 19   GLUCOSE 88 99 66* 85 62* 65* 84  BUN 58* 73* 85* 87* 85* 84* 84*  CREATININE 6.34* 8.97* 11.43* 11.70* 11.83* 11.53* 11.24*  ALBUMIN 2.2* 2.2* -- -- 2.6* -- 2.9*  CALCIUM 7.5* 7.3* 7.8* 7.9* 8.5 8.8 9.5  PHOS 4.4 5.3* -- -- -- -- --  AST -- -- -- -- 32 -- 36  ALT -- -- -- -- 25 -- 31   Liver Function Tests:  Lab 04/17/12 0625 04/16/12 0555 04/14/12 0610 04/13/12 1332  AST -- -- 32 36  ALT -- -- 25 31  ALKPHOS -- -- 156* 192*  BILITOT -- -- 0.8 0.8  PROT -- -- 7.1 8.3  ALBUMIN 2.2* 2.2* 2.6* --    Lab 04/13/12 1332  LIPASE 55  AMYLASE --   No results found for this basename: AMMONIA:3 in the last 168 hours CBC:  Lab 04/15/12 0905 04/14/12 0610 04/13/12 1332  WBC 9.2 11.4* 13.0*  NEUTROABS -- -- 7.3  HGB 9.0* 9.6* 11.1*  HCT 25.9* 26.6* 32.0*  MCV 69.4* 70.6* 71.1*  PLT 443* 507* 562*   Cardiac Enzymes:  Lab 04/13/12 1332  CKTOTAL --  CKMB --  CKMBINDEX --  TROPONINI <0.30   CBG:  Lab 04/17/12 0826 04/17/12 0715  04/16/12 2119 04/16/12 1725 04/16/12 1147  GLUCAP 109* 60* 107* 89 139*    Iron Studies: No results found for this basename: IRON,TIBC,TRANSFERRIN,FERRITIN in the last 72 hours Studies/Results: No results found.    Marland Kitchen atorvastatin  20 mg Oral QHS  . cholestyramine light  4 g Oral Custom  . feeding supplement  1 Container Oral TID BM  . heparin  5,000 Units Subcutaneous Q8H  . insulin aspart  0-9 Units Subcutaneous TID WC  . loperamide  2 mg Oral QID  . pantoprazole  40 mg Oral Q0600  . sodium chloride  3 mL Intravenous Q12H  . [DISCONTINUED] levofloxacin  250 mg Oral Daily    BMET    Component Value Date/Time   NA 136 04/17/2012 0625   K 4.4 04/17/2012 0625   CL 106 04/17/2012 0625   CO2 22 04/17/2012 0625   GLUCOSE 88 04/17/2012 0625   BUN 58* 04/17/2012 0625   CREATININE 6.34* 04/17/2012 0625   CREATININE 1.12 07/04/2011 1516   CALCIUM 7.5* 04/17/2012 0625   GFRNONAA 9* 04/17/2012 0625   GFRAA 10*  04/17/2012 0625   CBC    Component Value Date/Time   WBC 9.2 04/15/2012 0905   RBC 3.73* 04/15/2012 0905   HGB 9.0* 04/15/2012 0905   HCT 25.9* 04/15/2012 0905   PLT 443* 04/15/2012 0905   MCV 69.4* 04/15/2012 0905   MCH 24.1* 04/15/2012 0905   MCHC 34.7 04/15/2012 0905   RDW 18.6* 04/15/2012 0905   LYMPHSABS 4.8* 04/13/2012 1332   MONOABS 0.7 04/13/2012 1332   EOSABS 0.1 04/13/2012 1332   BASOSABS 0.0 04/13/2012 1332    Assessment/Plan:  1. Acute renal failure/acute kidney injury-- (Creatinine was 1.0 on 03/09/12). Finally starting to improve. Continue IVF's and continue with volume resuscitation, UOP and daily Scr. Renal function improving but again volume is the main issue.  He has not been able to keep up his intake at home as evidenced by 2 hospitalizations.  Agree with Tommas Olp suggestion of a portacath or infusaport so he can receive IVF's prn thru [facility] if possible. 2. Diarrhea per ostomy--Imodium has been given. Stool (for C. difficile and culture) has been  ordered. He was on Levaquin prior to admission  3. Prior history diabetes mellitus-with his second episode of acute renal failure in less than one month it is advisable to avoid metformin from here on as well as ACE/ARB until renal function and diarrhea stabilize.  4. Prior history hypertension-is not hypertensive now. He had been on valsartan prior to admission. This should be avoided for now  5. Right hemicolectomy/anastomotic leak/intra-abdominal abscess-S./P. surgical correction(s). Surgery should be informed of his admission. They may have some insight into why he has had such severe diarrhea causing 2 admissions in 2 weeks. GI eval may also be helpful in slowing fecal loss. Need to r/o C. Diff etc. 6. Metabolic acidosis- resolved. Switch to NS 7. Hyperkalemia- resolved 8. dispo- per primary svc 9. Would not resume ACE/ARB or metformin upon discharge due to risks outweighing benefits. [place] A

## 2012-04-17 NOTE — Progress Notes (Addendum)
Subjective: Pt states that he is ready to go home. Denies N/V/abdominal pain/dizziness.   Objective: Vital signs in last 24 hours: Filed Vitals:   04/16/12 1728 04/16/12 2146 04/17/12 0433 04/17/12 0922  BP: 117/57 109/61 111/64 115/63  Pulse: 73 74 78 76  Temp: 98.7 F (37.1 C) 97.5 F (36.4 C) 98.1 F (36.7 C) 98.4 F (36.9 C)  TempSrc:  Oral Oral   Resp: 18 17 20 18   Height:      Weight:  210 lb 12.8 oz (95.618 kg)    SpO2: 100% 100% 99% 100%   Weight change: 1 lb 11.2 oz (0.771 kg)  Intake/Output Summary (Last 24 hours) at 04/17/12 1015 Last data filed at 04/17/12 0923  Gross per 24 hour  Intake   4290 ml  Output   4400 ml  Net   -110 ml   Vitals reviewed. General: Lying comfortably in bed, NAD HEENT: PERRL, EOMI, no scleral icterus Cardiac: RRR, no rubs, murmurs or gallops Pulm: Clear to auscultation bilaterally, no wheezes, rales, or rhonchi Abd: Soft, nontender, nondistended, BS present, abd wound healing nicely, ostomy with liquid output and gas Ext: Warm and well perfused, no pedal edema Neuro: Alert and oriented X3, cranial nerves II-XII grossly intact  Lab Results: Basic Metabolic Panel:  Lab [id number] 0625 04/16/12 0555  NA 136 131*  K 4.4 4.0  CL 106 97  CO2 22 23  GLUCOSE 88 99  BUN 58* 73*  CREATININE 6.34* 8.97*  CALCIUM 7.5* 7.3*  MG -- --  PHOS 4.4 5.3*   Liver Function Tests:  Lab 04/17/12 0625 04/16/12 0555 04/14/12 0610 04/13/12 1332  AST -- -- 32 36  ALT -- -- 25 31  ALKPHOS -- -- 156* 192*  BILITOT -- -- 0.8 0.8  PROT -- -- 7.1 8.3  ALBUMIN 2.2* 2.2* -- --    Lab 04/13/12 1332  LIPASE 55  AMYLASE --   CBC:  Lab 04/15/12 0905 04/14/12 0610 04/13/12 1332  WBC 9.2 11.4* --  NEUTROABS -- -- 7.3  HGB 9.0* 9.6* --  HCT 25.9* 26.6* --  MCV 69.4* 70.6* --  PLT 443* 507* --   Cardiac Enzymes:  Lab 04/13/12 1332  CKTOTAL --  CKMB --  CKMBINDEX --  TROPONINI <0.30   CBG:  Lab 04/17/12 0826 04/17/12 0715 04/16/12 2119  04/16/12 1725 04/16/12 1147 04/16/12 0748  GLUCAP 109* 60* 107* 89 139* 101*   Coagulation:  Lab 04/14/12 0610 04/13/12 1332  LABPROT 15.5* 14.6  INR 1.25 1.16   Urinalysis:  Lab 04/13/12 1547  COLORURINE AMBER*  LABSPEC 1.029  PHURINE 5.0  GLUCOSEU NEGATIVE  HGBUR NEGATIVE  BILIRUBINUR SMALL*  KETONESUR 15*  PROTEINUR >300*  UROBILINOGEN 1.0  NITRITE NEGATIVE  LEUKOCYTESUR SMALL*    Micro Results: Recent Results (from the past 240 hour(s))  URINE CULTURE     Status: Normal   Collection Time   04/13/12  3:47 PM      Component Value Range Status Comment   Specimen Description URINE, CLEAN CATCH   Final    Special Requests NONE   Final    Culture  Setup Time 04/13/2012 16:47   Final    Colony Count NO GROWTH   Final    Culture NO GROWTH   Final    Report Status 04/14/2012 FINAL   Final   CLOSTRIDIUM DIFFICILE BY PCR     Status: Normal   Collection Time   04/13/12  4:09 PM  Component Value Range Status Comment   C difficile by pcr NEGATIVE  NEGATIVE Final   STOOL CULTURE     Status: Normal (Preliminary result)   Collection Time   04/14/12  9:18 AM      Component Value Range Status Comment   Specimen Description STOOL   Final    Special Requests Normal   Final    Culture NO SUSPICIOUS COLONIES, CONTINUING TO HOLD   Final    Report Status PENDING   Incomplete    Studies/Results: No results found. Medications: I have reviewed the patient's current medications. Scheduled Meds:    . atorvastatin  20 mg Oral QHS  . cholestyramine light  4 g Oral Custom  . feeding supplement  1 Container Oral TID BM  . heparin  5,000 Units Subcutaneous Q8H  . insulin aspart  0-9 Units Subcutaneous TID WC  . loperamide  2 mg Oral QID  . pantoprazole  40 mg Oral Q0600  . sodium chloride  3 mL Intravenous Q12H  . [DISCONTINUED] levofloxacin  250 mg Oral Daily   Continuous Infusions:    . sodium chloride 150 mL/hr at 04/17/12 0521   PRN Meds:.acetaminophen, acetaminophen,  ondansetron (ZOFRAN) IV  Assessment/Plan: [name] is a 61 yo gentleman with a history of type 2 diabetes on insulin, invasive adenocarcinoma of colon s/p right hemicolectomy complicated by postop intra-abdominal abscess with a leaking anastamosis, OSA, hypertension, who presents with nausea, vomiting, and dehydration resulting in reoccurrence of acute renal failure.   1. Acute renal failure: Recurrent. On admission, creatinine was 11.2 and BUN 84. Likely secondary to extreme volume depletion in the setting of nausea, vomiting, increased ileostomy output. In his previous admission, his BUN was 156 and creatinine 17. Bicarbonate was 16 and potassium 6.5. It was likely related to his IV contrast used for abdominal CT at last admission. He was treated with IV fluids and left the hospital on 04/05/12 with a creatinine of 3.6. Of note, the patient's creatinine was 1.0 on 03/09/12. On this admission, the patient was a severely volume depleted and acidotic with an anion gap of 18.  Metabolic acidosis likely due to renal failure secondary to dehydration. Renal U/S showed no obstruction, bilateral renal cysts noted. Renal was consulted, Dr. Hassell Done evaluated the patient and suggested treating with volume resuscitation and alkalai therapy. Appreciate Renal evaluation and recommendations. Per Renal recs, he was started on 1/2 NS with 75 mEq bicarbonate at 250 cc an hour x 8 hours then 120 cc per hour. His AG on HD #2 was still elevated at 17, and we continued the fluids per Renal. His AG closed on 11/14 and has remained closed. FeNa <<< 1.0. Renal recommended continuing the fluids at least until today; no HD. UOP improving daily. Will f/u with Renal today. - Strict I and O's, daily weights  - Zofran PRN  - Follow BMP  - F/u with Renal  2. Hyperkalemia: K was 5.4 on admission without EKG changes. Patient received 1 dose of Kayexalate, 15 g in ED. On repeat BMP, potassium 4.9, 4.4 this morning. Will continue to  monitor. - am BMP   Lab 04/17/12 0625 04/16/12 0555 04/15/12 0600 04/14/12 2027 04/14/12 0610  K 4.4 4.0 4.0 4.5 4.4    3. 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 S/p prior  wound vac in place which was removed on 03/10/12. His wound was healing nicely at previous d/c, and continues to heal well.  At this admission, his ileostomy site appears clear, with liquid output. He previous CT scan abd/pelvis showed inflammation of omental fat, no focal abscess noted, no SBO. Pt with abdominal pain to deep palpation around the incision, no rebound or guarding. Does not appear septic. Dr. [name] saw the pt in the hospital and felt the wound was healing well. - Continue Ileostomy care   4. Nausea, vomiting and diarrhea: Patient has been on Levaquin recently, which puts patient at risk of getting C. difficile colitis, but Cdiff PCR negative. Patient had elevated lipase in the past,143 on 03/29/12. Lipase 55 on admission. Tolerating diet, but still with limited po intake, but improving. Will consider PICC for IVF for d/c. Dr. [name] saw the pt and recommended GI consult for the watery output from his ostomy. GI recommended to continue PPI, IVF, Imodium, and started Questran. Since beginning the Questran, his ostomy output has thickened up somewhat. Will continue to monitor. - Continue Zofran for nausea.  - Renal diet - F/u GI  5. Leukocytosis: Resolved. WBC 13 on admission, 9.2 at last check. Hx of UTI during last admission but pt only received 4 doses of 14 of Levaquin therapy b/c he couldn't afford the medication after discharge. Patient specifically denies dysuria, fever, chills. Still has a leukocytosis, may have been from uncleared UTI, stress reaction, AKI. Urine cx grew pan  sensitive E. coli at last admission. C. Diff, stool cx, and urine cx negative. Continued Levaquin to treat as complicated UTI in a male, day 3. Stopping Levaquin, as Ucx neg.  Lab 04/15/12 0905 04/14/12 0610 04/13/12 1332  WBC 9.2 11.4* 13.0*    6. DM-II: Last A1c was 6.2 at 03/02/12. Patient has been on Metformin and Novolin 70/30 at home. Holding Metformin as pt with metabolic acidosis and renal failure.  Placed on Lantus 10u with SSI. Lantus held 2x and CBGs stable, so stopped Lantus. Will continue SSI. - Continue SSI  Lab 04/17/12 0826 04/17/12 0715 04/16/12 2119 04/16/12 1725 04/16/12 1147  GLUCAP 109* 60* 107* 89 139*    7. HTN: Hypotensive on admission with blood pressure of 92/59. Improving with IVF and inceased po intake. Today 115/63.  8. PPX: [place] Heparin     LOS: 4 days   Otho Bellows 04/17/2012, 10:15 AM

## 2012-04-17 NOTE — Progress Notes (Signed)
Clinical Social Work-CSW received bed offer from previous SNF (where pt did not want to return). Due to pt insurance status (self-pay) CSW has has no other bed offers from any other SNF's within 4 counties. If pt declines this SNF his only option will be to d/c home-CSW will leave report and unit CSW will facilitate d/c to [facility] Monday if pt Dian Queen; MSW, [phone]

## 2012-04-17 NOTE — Progress Notes (Signed)
Hypoglycemic Event  CBG: 60  Treatment: 15 GM carbohydrate snack  Symptoms: Pale and None  Follow-up CBG: Time:0730 CBG Result:109  Possible Reasons for Event: Inadequate meal intake      Velora Mediate  Remember to initiate Hypoglycemia Order Set & complete

## 2012-04-17 NOTE — Evaluation (Signed)
Physical Therapy Evaluation Patient Details Name: [name] MRN: [id number] DOB: [birth year] Today's Date: 04/17/2012 Time: [id number] PT Time Calculation (min): 38 min  PT Assessment / Plan / Recommendation Clinical Impression  Pt. presented with ARF/AKI, diarrhea ostomy along with N&V, and has recent hemicolectomy/ileostomy with some complications.  He reports he has not been able to tolerate walking since his surgery in Sept. due to dizziness and today presents with need for RW for stability  in ambulation.  He was asymptomatic for dizziness or SOB and will benefit from acute PT to maximize his independence prior to DC home with near 24 hour assist from 2 daughters.      PT Assessment  Patient needs continued PT services    Follow Up Recommendations  Home health PT;Supervision/Assistance - 24 hour    Does the patient have the potential to tolerate intense rehabilitation      Barriers to Discharge None      Equipment Recommendations  None recommended by PT    Recommendations for Other Services     Frequency Min 3X/week    Precautions / Restrictions Precautions Precautions: Fall Precaution Comments: no HD cath at this point Restrictions Weight Bearing Restrictions: No   Pertinent Vitals/Pain No pain, no distress      Mobility  Bed Mobility Bed Mobility: Supine to Sit;Sitting - Scoot to Edge of Bed;Sit to Supine Supine to Sit: 7: Independent Sitting - Scoot to Edge of Bed: 7: Independent Sit to Supine: Not Tested (comment) Details for Bed Mobility Assistance: Pt. managed independently today without difficulty Transfers Transfers: Sit to Stand;Stand to Sit Sit to Stand: 6: Modified independent (Device/Increase time);With upper extremity assist;From bed Stand to Sit: 6: Modified independent (Device/Increase time);To chair/3-in-1;With armrests Details for Transfer Assistance: cues needed for hand placement in transitional movements, pt. physically managed to rise  and sit without assist Ambulation/Gait Ambulation/Gait Assistance: 4: Min guard Ambulation Distance (Feet): 100 Feet Assistive device: Rolling walker Ambulation/Gait Assistance Details: Pt. managed RW well, no overt LOB and no dizziness.  Min guard for safety. Gait Pattern: Within Functional Limits Gait velocity: slight slowing Stairs: No Wheelchair Mobility Wheelchair Mobility: No    Shoulder Instructions     Exercises     PT Diagnosis: Difficulty walking;Generalized weakness  PT Problem List: Decreased activity tolerance;Decreased balance;Decreased mobility;Decreased knowledge of use of DME PT Treatment Interventions: DME instruction;Gait training;Stair training;Functional mobility training;Therapeutic activities;Therapeutic exercise;Balance training;Patient/family education   PT Goals Acute Rehab PT Goals PT Goal Formulation: With patient Time For Goal Achievement: 04/24/12 Potential to Achieve Goals: Good Pt will go Sit to Stand: Independently PT Goal: Sit to Stand - Progress: Goal set today Pt will go Stand to Sit: Independently PT Goal: Stand to Sit - Progress: Goal set today Pt will Transfer Bed to Chair/Chair to Bed: with modified independence PT Transfer Goal: Bed to Chair/Chair to Bed - Progress: Goal set today Pt will Ambulate: >150 feet;with supervision;with rolling walker PT Goal: Ambulate - Progress: Goal set today Pt will Go Up / Down Stairs: 1-2 stairs;with rail(s);with min assist PT Goal: Up/Down Stairs - Progress: Goal set today  Visit Information  Last PT Received On: 04/17/12 Assistance Needed: +1    Subjective Data  Subjective: "I don't have to do dialysis now".  Reports he was "normal" and working prior to Sept. of this year. Patient Stated Goal: "I need to take care of myself and walk".     Prior Functioning  Home Living Lives With: Alone;Daughter;Other (Comment) (daughter stays with pt.  currently) Available Help at Discharge: Family;Other  (Comment) (2 daughters provide near 24 hour assist) Type of Home: Apartment Home Access: Stairs to enter Entrance Stairs-Number of Steps: 2 Entrance Stairs-Rails: Right;Left;Can reach both Home Layout: One level Bathroom Shower/Tub: Product/process development scientist: Standard Bathroom Accessibility: Yes How Accessible: Accessible via walker Home Adaptive Equipment: Walker - rolling Prior Function Level of Independence: Independent with assistive device(s) Able to Take Stairs?: Yes Driving: Yes (drives but doesn't have his own car) Vocation: Part time employment Communication Communication: No difficulties Dominant Hand: Right    Cognition  Overall Cognitive Status: Appears within functional limits for tasks assessed/performed Arousal/Alertness: Awake/alert Orientation Level: Appears intact for tasks assessed Behavior During Session: [facility] for tasks performed Current Attention Level: Selective    Extremity/Trunk Assessment Right Upper Extremity Assessment RUE ROM/Strength/Tone: [facility] for tasks assessed Left Upper Extremity Assessment LUE ROM/Strength/Tone: WFL for tasks assessed Right Lower Extremity Assessment RLE ROM/Strength/Tone: Within functional levels Left Lower Extremity Assessment LLE ROM/Strength/Tone: Within functional levels Trunk Assessment Trunk Assessment: Normal   Balance Balance Balance Assessed: Yes Static Sitting Balance Static Sitting - Balance Support: No upper extremity supported;Feet supported Static Sitting - Level of Assistance: 7: Independent Static Standing Balance Static Standing - Balance Support: Bilateral upper extremity supported Static Standing - Level of Assistance: 6: Modified independent (Device/Increase time)  End of Session PT - End of Session Equipment Utilized During Treatment: Gait belt Activity Tolerance: Patient tolerated treatment well Patient left: in chair;with call bell/phone within reach Nurse Communication: Mobility  status  GP     Ladona Ridgel 04/17/2012, 12:07 PM Gerlean Ren PT Acute Rehab Services [place] [phone]

## 2012-04-17 NOTE — Progress Notes (Signed)
Physical Therapy Treatment Patient Details Name: [name] MRN: [id number] DOB: December 15, 1950 Today's Date: 04/17/2012 Time: [id number] PT Time Calculation (min): 23 min  PT Assessment / Plan / Recommendation Comments on Treatment Session  Pt. improving in his ambulation.  Case manager Anne Ng informed this PT that pt. cannot receive  HHPT due to insurance problems, however reports an RN will be going out to the home and home care has been made aware that pt. needs practice with ambulation.  In the meantime, RN Thayer Headings here a [facility] plans to walk pt. again this pm and will ask following shift to do the same.      Follow Up Recommendations  Home health PT;Supervision/Assistance - 24 hour     Does the patient have the potential to tolerate intense rehabilitation     Barriers to Discharge        Equipment Recommendations  None recommended by PT    Recommendations for Other Services    Frequency Min 3X/week   Plan Discharge plan remains appropriate;Frequency remains appropriate    Precautions / Restrictions Precautions Precautions: Fall Restrictions Weight Bearing Restrictions: No   Pertinent Vitals/Pain No pain, no distress    Mobility  Bed Mobility Bed Mobility: Supine to Sit;Sitting - Scoot to Edge of Bed;Sit to Supine Supine to Sit: 7: Independent Sitting - Scoot to Edge of Bed: 7: Independent Sit to Supine: 6: Modified independent (Device/Increase time);HOB elevated Details for Bed Mobility Assistance: Pt. managed independently today without difficulty Transfers Transfers: Sit to Stand;Stand to Sit Sit to Stand: 6: Modified independent (Device/Increase time);With upper extremity assist;From bed Stand to Sit: 6: Modified independent (Device/Increase time);To bed;With upper extremity assist Details for Transfer Assistance: managing hand placement well without prompting this pm Ambulation/Gait Ambulation/Gait Assistance: 5: Supervision Ambulation Distance (Feet): 150  Feet Assistive device: Rolling walker Ambulation/Gait Assistance Details: Managing RW well, no LOB noted Gait Pattern: Within Functional Limits Stairs: No Wheelchair Mobility Wheelchair Mobility: No    Exercises     PT Diagnosis:    PT Problem List:   PT Treatment Interventions:     PT Goals Acute Rehab PT Goals PT Goal: Supine/Side to Sit - Progress: Progressing toward goal PT Goal: Sit to Supine/Side - Progress: Progressing toward goal PT Goal: Sit to Stand - Progress: Progressing toward goal PT Goal: Stand to Sit - Progress: Progressing toward goal PT Goal: Ambulate - Progress: Progressing toward goal  Visit Information  Last PT Received On: 04/17/12 Assistance Needed: +1    Subjective Data  Subjective: "You are going to make me miss my show" ([address]), pt. kidded   Cognition  Overall Cognitive Status: Appears within functional limits for tasks assessed/performed Arousal/Alertness: Awake/alert Orientation Level: Appears intact for tasks assessed Behavior During Session: [facility] for tasks performed Current Attention Level: Alternating Following Commands: Follows multi-step commands consistently    Balance     End of Session PT - End of Session Equipment Utilized During Treatment: Gait belt Activity Tolerance: Patient tolerated treatment well Patient left: in bed;with call bell/phone within reach Nurse Communication: Mobility status   GP     Ladona Ridgel 04/17/2012, 3:33 PM Gerlean Ren PT Acute Rehab Services [place] [phone]

## 2012-04-17 NOTE — Progress Notes (Signed)
Utilization Review Completed.   Marytza Grandpre, RN, BSN Nurse Case Manager  [phone]  

## 2012-04-17 NOTE — Progress Notes (Signed)
Internal Medicine Attending  Date: 04/17/2012  Patient name: [name] Medical record number: [id number] Date of birth: 10-25-1950 Age: 61 y.o. Gender: male  I saw and evaluated the patient, and discussed his care with resident Dr. Eulas Post; see her note for details of clinical findings and plans. His renal function continues to improve with IV volume replacement, and his ileostomy output is improving to some extent.  I agree that longer-term parenteral access would be helpful for IV fluid administration; would discuss with nephrology the best choice of access so as not to interfere with possible future need for hemodialysis access.  Paregoric is not available on our inpatient formulary; low-dose codeine may be a reasonable alternative.  Patient would likely benefit from SNF/rehabilitation placement when he is medically ready for discharge.  Dr. Ellwood Dense will take over as attending physician tomorrow 04/18/2012.

## 2012-04-17 NOTE — Progress Notes (Signed)
     [facility] Daily Rounding Note 04/17/2012, 10:07 AM  SUBJECTIVE:       1800 cc of ostomy output recorded 11/15.  150 cc so far today. Feels well.  No pain, no dyspnea.  Tolerating solids.  Wants to go home  OBJECTIVE:         Vital signs in last 24 hours:    Temp:  [97.5 F (36.4 C)-98.7 F (37.1 C)] 98.4 F (36.9 C) (11/15 0922) Pulse Rate:  [73-78] 76  (11/15 0922) Resp:  [17-20] 18  (11/15 0922) BP: (99-117)/(57-64) 115/63 mmHg (11/15 0922) SpO2:  [99 %-100 %] 100 % (11/15 0922) Weight:  [95.618 kg (210 lb 12.8 oz)] 95.618 kg (210 lb 12.8 oz) (11/14 2146) Last BM Date: 04/16/12 General: looks well   Heart: RRR Chest: clear B.  No SOB Abdomen: soft, NT, watery liquid brown stool in ostomy  Extremities: no pedal edema Neuro/Psych:  Pleasant, relaxed, no confusion.   Intake/Output from previous day: 11/14 0701 - 11/15 0700 In: 4290 [P.O.:840; I.V.:3450] Out: 5200 [Urine:3400; Stool:1800]  Intake/Output this shift: Total I/O In: 480 [P.O.:480] Out: 850 [Urine:700; Stool:150]  Lab Results:  [facility] 04/15/12 0905  WBC 9.2  HGB 9.0*  HCT 25.9*  PLT 443*   BMET  Basename 04/17/12 0625 04/16/12 0555 04/15/12 0600  NA 136 131* 133*  K 4.4 4.0 4.0  CL 106 97 93*  CO2 22 23 27   GLUCOSE 88 99 66*  BUN 58* 73* 85*  CREATININE 6.34* 8.97* 11.43*  CALCIUM 7.5* 7.3* 7.8*    ASSESMENT: * High ouput from ileostomy. Nausea, vomitting and subsequent dehydration. C dif PCR negative. Stool clx in process.  On imodium, Questran, Protonix, IVF Stool C diff negative, stool clx with no suspicious colonies.  * Recurrent ARF, limited dialysis during most recent admission. BUN/creat continue to improve. Oliguria resolved.  Renal following.  * S/p right hemicolectomy with small amount of terminal ileum seen  * UTI. Note that the specimen of 11/11 was poor (many squams, only 0-2 WBCs, few bacteria) . Urine clx has not grown anything. Still on levaquin.  * Hyponatremia.  Resolved * Microcytic anemia. Stable.    * IDDM   PLAN: *  Trial paregoric or codeine?    LOS: 4 days   Azucena Freed  04/17/2012, 10:07 AM Pager: [phone]   GI ATTENDING  Patient seen and examined. Reviewed above. Continue current therapies, monitor intake and output as well as renal function.  Docia Chuck. Geri Seminole., M.D. [facility] Division of Gastroenterology

## 2012-04-18 LAB — GLUCOSE, CAPILLARY
Glucose-Capillary: 133 mg/dL — ABNORMAL HIGH (ref 70–99)
Glucose-Capillary: 93 mg/dL (ref 70–99)
Glucose-Capillary: 124 mg/dL — ABNORMAL HIGH (ref 70–99)
Glucose-Capillary: 106 mg/dL — ABNORMAL HIGH (ref 70–99)

## 2012-04-18 LAB — RENAL FUNCTION PANEL
Albumin: 2 g/dL — ABNORMAL LOW (ref 3.5–5.2)
BUN: 45 mg/dL — ABNORMAL HIGH (ref 6–23)
CO2: 20 mEq/L (ref 19–32)
Calcium: 7.3 mg/dL — ABNORMAL LOW (ref 8.4–10.5)
Chloride: 112 mEq/L (ref 96–112)
Creatinine, Ser: 4.65 mg/dL — ABNORMAL HIGH (ref 0.50–1.35)
GFR calc Af Amer: 14 mL/min — ABNORMAL LOW (ref >90)
GFR calc non Af Amer: 12 mL/min — ABNORMAL LOW (ref >90)
Glucose, Bld: 98 mg/dL (ref 70–99)
Phosphorus: 3.8 mg/dL (ref 2.3–4.6)
Potassium: 4.4 mEq/L (ref 3.5–5.1)
Sodium: 138 mEq/L (ref 135–145)

## 2012-04-18 LAB — STOOL CULTURE: Special Requests: NORMAL

## 2012-04-18 NOTE — Progress Notes (Signed)
Patient ID: [name], male   DOB: [birth year], 61 y.o.   MRN: [id number] [facility] Progress Note  Subjective:  Doing well- happy and eating solid food without difficulty- able to be up walking. No c/o abdominal pain,ileostomy output  had decreased significantly  Objective:  Vital signs in last 24 hours: Temp:  [97.7 F (36.5 C)-98.5 F (36.9 C)] 97.7 F (36.5 C) (11/16 0905) Pulse Rate:  [70-80] 80  (11/16 0905) Resp:  [18] 18  (11/16 0905) BP: (116-124)/(61-67) 124/67 mmHg (11/16 0905) SpO2:  [98 %-100 %] 98 % (11/16 0905) Weight:  [213 lb 12.8 oz (96.979 kg)] 213 lb 12.8 oz (96.979 kg) (11/15 2049) Last BM Date: 04/18/12 General:   Alert,  Well-developed,AA male     in NAD,eating lunch Heart:  Regular rate and rhythm; no murmurs Pulm;clear Abdomen:  Soft, nontender and nondistended. Normal bowel sounds, without guarding, and without rebound. Ileostomy RLQ  Extremities:  Without edema. Neurologic:  Alert and  oriented x4;  grossly normal neurologically. Psych:  Alert and cooperative. Normal mood and affect.  Intake/Output from previous day: 11/15 0701 - 11/16 0700 In: 1320 [P.O.:1320] Out: 3350 [Urine:2225; Stool:1125] Intake/Output this shift: Total I/O In: -  Out: 1125 [Urine:825; Stool:300]  Lab Results: No results found for this basename: WBC:3,HGB:3,HCT:3,PLT:3 in the last 72 hours BMET  [facility] 04/18/12 0440 04/17/12 0625 04/16/12 0555  NA 138 136 131*  K 4.4 4.4 4.0  CL 112 106 97  CO2 20 22 23   GLUCOSE 98 88 99  BUN 45* 58* 73*  CREATININE 4.65* 6.34* 8.97*  CALCIUM 7.3* 7.5* 7.3*   LFT  Basename 04/18/12 0440  PROT --  ALBUMIN 2.0*  AST --  ALT --  ALKPHOS --  BILITOT --  BILIDIR --  IBILI --   Assessment / Plan: # 61  Yo male with high ileostomy output after complicated post op course. He is responding to antidiarrheals, Questran and now paragoric. Continue current regimen #2 renal failure secondary to above-  improving    he will need several more days to observe and assure he will be able to  Keep him self hydrated- upon discharge  Principal Problem:  *Short gut syndrome Active Problems:  Dehydration     LOS: 5 days   Amy Esterwood  04/18/2012, 12:01 PM    GI ATTENDING  Patient seen and examined. Agree with assessment and plan as outlined above. If the patient is able to maintain good by mouth intake and reasonable output, and his discharge medication should be the same as his hospital medications. In the interim, continue to monitor electrolytes and renal function.  Docia Chuck. Geri Seminole., M.D. [facility] Division of Gastroenterology

## 2012-04-18 NOTE — Progress Notes (Addendum)
Subjective: Pt states that he is ready to go home. He has been up and ambulating with PT. Denies N/V/abdominal pain/dizziness.   Objective: Vital signs in last 24 hours: Filed Vitals:   04/17/12 1646 04/17/12 2049 04/18/12 0539 04/18/12 0905  BP: 118/63 118/61 116/67 124/67  Pulse: 77 70 75 80  Temp: 98.5 F (36.9 C) 97.7 F (36.5 C) 98.1 F (36.7 C) 97.7 F (36.5 C)  TempSrc:  Oral Oral Oral  Resp: 18 18 18 18   Height:      Weight:  213 lb 12.8 oz (96.979 kg)    SpO2: 100% 100% 99% 98%   Weight change: 3 lb (1.361 kg)  Intake/Output Summary (Last 24 hours) at 04/18/12 0934 Last data filed at 04/18/12 0810  Gross per 24 hour  Intake    840 ml  Output   3300 ml  Net  -2460 ml   Vitals reviewed. General: Sitting up in bed watching TV, NAD HEENT: PERRL, EOMI, no scleral icterus Cardiac: RRR, no rubs, murmurs or gallops Pulm: Clear to auscultation bilaterally, no wheezes, rales, or rhonchi Abd: Soft, nontender, nondistended, BS present, abd wound with bandage with drainage, ostomy with thicker output today Ext: Warm and well perfused, no pedal edema Neuro: Alert and oriented X3, cranial nerves II-XII grossly intact  Lab Results: Basic Metabolic Panel:  Lab [id number] 0440 04/17/12 0625  NA 138 136  K 4.4 4.4  CL 112 106  CO2 20 22  GLUCOSE 98 88  BUN 45* 58*  CREATININE 4.65* 6.34*  CALCIUM 7.3* 7.5*  MG -- --  PHOS 3.8 4.4   Liver Function Tests:  Lab 04/18/12 0440 04/17/12 0625 04/14/12 0610 04/13/12 1332  AST -- -- 32 36  ALT -- -- 25 31  ALKPHOS -- -- 156* 192*  BILITOT -- -- 0.8 0.8  PROT -- -- 7.1 8.3  ALBUMIN 2.0* 2.2* -- --    Lab 04/13/12 1332  LIPASE 55  AMYLASE --   CBC:  Lab 04/15/12 0905 04/14/12 0610 04/13/12 1332  WBC 9.2 11.4* --  NEUTROABS -- -- 7.3  HGB 9.0* 9.6* --  HCT 25.9* 26.6* --  MCV 69.4* 70.6* --  PLT 443* 507* --   Cardiac Enzymes:  Lab 04/13/12 1332  CKTOTAL --  CKMB --  CKMBINDEX --  TROPONINI <0.30    CBG:  Lab 04/18/12 0823 04/17/12 2055 04/17/12 1644 04/17/12 1141 04/17/12 0920 04/17/12 0826  GLUCAP 133* 93 121* 110* 138* 109*   Coagulation:  Lab 04/14/12 0610 04/13/12 1332  LABPROT 15.5* 14.6  INR 1.25 1.16   Urinalysis:  Lab 04/13/12 1547  COLORURINE AMBER*  LABSPEC 1.029  PHURINE 5.0  GLUCOSEU NEGATIVE  HGBUR NEGATIVE  BILIRUBINUR SMALL*  KETONESUR 15*  PROTEINUR >300*  UROBILINOGEN 1.0  NITRITE NEGATIVE  LEUKOCYTESUR SMALL*    Micro Results: Recent Results (from the past 240 hour(s))  URINE CULTURE     Status: Normal   Collection Time   04/13/12  3:47 PM      Component Value Range Status Comment   Specimen Description URINE, CLEAN CATCH   Final    Special Requests NONE   Final    Culture  Setup Time 04/13/2012 16:47   Final    Colony Count NO GROWTH   Final    Culture NO GROWTH   Final    Report Status 04/14/2012 FINAL   Final   CLOSTRIDIUM DIFFICILE BY PCR     Status: Normal   Collection Time  04/13/12  4:09 PM      Component Value Range Status Comment   C difficile by pcr NEGATIVE  NEGATIVE Final   STOOL CULTURE     Status: Normal (Preliminary result)   Collection Time   04/14/12  9:18 AM      Component Value Range Status Comment   Specimen Description STOOL   Final    Special Requests Normal   Final    Culture     Final    Value: NO SUSPICIOUS COLONIES, CONTINUING TO HOLD     Note: REDUCED NORMAL FLORA PRESENT   Report Status PENDING   Incomplete    Studies/Results: No results found. Medications: I have reviewed the patient's current medications. Scheduled Meds:    . atorvastatin  20 mg Oral QHS  . cholestyramine light  4 g Oral Custom  . feeding supplement  1 Container Oral TID BM  . heparin  5,000 Units Subcutaneous Q8H  . insulin aspart  0-9 Units Subcutaneous TID WC  . loperamide  2 mg Oral QID  . pantoprazole  40 mg Oral Q0600  . sodium chloride  3 mL Intravenous Q12H   Continuous Infusions:    . sodium chloride 150 mL/hr  at 04/18/12 0553   PRN Meds:.acetaminophen, acetaminophen, ondansetron (ZOFRAN) IV  Assessment/Plan: [name] is a 61 yo gentleman with a history of type 2 diabetes on insulin, invasive adenocarcinoma of colon s/p right hemicolectomy complicated by postop intra-abdominal abscess with a leaking anastamosis, OSA, hypertension, who presents with nausea, vomiting, and dehydration resulting in reoccurrence of acute renal failure.   1. Acute renal failure: Recurrent. On admission, creatinine was 11.2 and BUN 84. Likely secondary to extreme volume depletion in the setting of nausea, vomiting, increased ileostomy output. In his previous admission, his BUN was 156 and creatinine 17. Bicarbonate was 16 and potassium 6.5. It was likely related to his IV contrast used for abdominal CT at last admission. He was treated with IV fluids and left the hospital on 04/05/12 with a creatinine of 3.6. Of note, the patient's creatinine was 1.0 on 03/09/12. On this admission, the patient was a severely volume depleted and acidotic with an anion gap of 18.  Metabolic acidosis likely due to renal failure secondary to dehydration. Renal U/S showed no obstruction, bilateral renal cysts noted. Renal was consulted, Dr. Hassell Done evaluated the patient and suggested treating with volume resuscitation and alkalai therapy. Appreciate Renal evaluation and recommendations. Per Renal recs, he was started on 1/2 NS with 75 mEq bicarbonate at 250 cc an hour x 8 hours then 120 cc per hour. His AG on HD #2 was still elevated at 17, and we continued the fluids per Renal. His AG closed on 11/14. FeNa <<< 1.0. Cr 4.65 with GFR 14 today. Renal recommended continuing the fluids at least until today; no HD. UOP improving daily, foley is out. Will f/u with Renal today. - Strict I and O's, daily weights  - Zofran PRN  - Follow BMP  - F/u with Renal  2. Hyperkalemia: Resolved.  K was 5.4 on admission without EKG changes. Patient received 1 dose of  Kayexalate, 15 g in ED. On repeat BMP, potassium 4.9, 4.4 this morning. Will continue to monitor. - am BMP   Lab 04/18/12 0440 04/17/12 0625 04/16/12 0555 04/15/12 0600 04/14/12 2027  K 4.4 4.4 4.0 4.0 4.5    3. Colon adenocarcinoma s/p right hemicolectomy and ileostomy: Initial surgery 02/06/12, and unfortunately he developed an anastomotic leak with  intraabdominal abscess formation post-op with return to the OR for multiple washouts, and subsequent abdominal wound closure on 02/26/12. S/p prior wound vac in place which was removed on 03/10/12. His wound was healing nicely at previous d/c, and continues to heal well.  At this admission, his ileostomy site appears clear, with liquid output. He previous CT scan abd/pelvis showed inflammation of omental fat, no focal abscess noted, no SBO. Pt with abdominal pain to deep palpation around the incision, no rebound or guarding. Does not appear septic. Dr. [name] saw the pt in the hospital and felt the wound was healing well. - Continue Ileostomy care   4. Nausea, vomiting and diarrhea: Patient has been on Levaquin recently, which puts patient at risk of getting C. difficile colitis, but Cdiff PCR negative. Patient had elevated lipase in the past,143 on 03/29/12. Lipase 55 on admission. Tolerating diet, but still with limited po intake, but improving. Will consider PICC for IVF for d/c. Dr. [name] saw the pt and recommended GI consult for the watery output from his ostomy. GI recommended to continue PPI, IVF, Imodium, and started Questran. Since beginning the Questran, his ostomy output has thickened up somewhat. Will continue to monitor. - Continue Zofran for nausea.  - Renal diet - F/u GI  5. Leukocytosis: Resolved. WBC 13 on admission, 9.2 at last check. Hx of UTI during last admission but pt only received 4 doses of 14 of Levaquin therapy b/c he couldn't afford the medication after discharge. Patient specifically denies dysuria, fever, chills. Still has a  leukocytosis, may have been from uncleared UTI, stress reaction, AKI. Urine cx grew pan sensitive E. coli at last admission. C. Diff, stool cx, and urine cx negative. Continued Levaquin to treat as complicated UTI in a male, day 3. Stopping Levaquin, as Ucx neg.  Lab 04/15/12 0905 04/14/12 0610 04/13/12 1332  WBC 9.2 11.4* 13.0*    6. DM-II: Last A1c was 6.2 at 03/02/12. Patient has been on Metformin and Novolin 70/30 at home. Holding Metformin as pt with metabolic acidosis and renal failure.  Placed on Lantus 10u with SSI. Lantus held 2x and CBGs stable, so stopped Lantus. Will continue SSI, but pt has not required any insulin.  - Continue SSI  Lab 04/18/12 0823 04/17/12 2055 04/17/12 1644 04/17/12 1141 04/17/12 0920  GLUCAP 133* 93 121* 110* 138*    7. HTN: Hypotensive on admission with blood pressure of 92/59. Improving with IVF and inceased po intake. Today 124/67.  8. PPX: [place] Heparin     LOS: 5 days   Otho Bellows 04/18/2012, 9:34 AM

## 2012-04-18 NOTE — Progress Notes (Signed)
Patient ID: [name], male   DOB: 09/12/1950, 61 y.o.   MRN: [id number] S:feels better O:BP 124/67  Pulse 80  Temp 97.7 F (36.5 C) (Oral)  Resp 18  Ht 6' (1.829 m)  Wt 96.979 kg (213 lb 12.8 oz)  BMI 29.00 kg/m2  SpO2 98%  Intake/Output Summary (Last 24 hours) at 04/18/12 1117 Last data filed at 04/18/12 1029  Gross per 24 hour  Intake    840 ml  Output   3625 ml  Net  -2785 ml   Intake/Output: I/O last 3 completed shifts: In: 2970 [P.O.:1320; I.V.:1650] Out: [id number] [Urine:3625; [id number]  Intake/Output this shift:  Total I/O In: -  Out: 1125 [Urine:825; Stool:300] Weight change: 1.361 kg (3 lb) Gen:WD WN AAM in NAD CVS:no rub Resp:CTA [id number]  Ext:no edema   Lab 04/18/12 0440 04/17/12 0625 04/16/12 0555 04/15/12 0600 04/14/12 2027 04/14/12 0610 04/13/12 2344 04/13/12 1332  NA 138 136 131* 133* 129* 130* 130* --  K 4.4 4.4 4.0 4.0 4.5 4.4 4.9 --  CL 112 106 97 93* 90* 92* 92* --  CO2 20 22 23 27 22 21 21  --  GLUCOSE 98 88 99 66* 85 62* 65* --  BUN 45* 58* 73* 85* 87* 85* 84* --  CREATININE 4.65* 6.34* 8.97* 11.43* 11.70* 11.83* 11.53* --  ALBUMIN 2.0* 2.2* 2.2* -- -- 2.6* -- 2.9*  CALCIUM 7.3* 7.5* 7.3* 7.8* 7.9* 8.5 8.8 --  PHOS 3.8 4.4 5.3* -- -- -- -- --  AST -- -- -- -- -- 32 -- 36  ALT -- -- -- -- -- 25 -- 31   Liver Function Tests:  Lab 04/18/12 0440 04/17/12 0625 04/16/12 0555 04/14/12 0610 04/13/12 1332  AST -- -- -- 32 36  ALT -- -- -- 25 31  ALKPHOS -- -- -- 156* 192*  BILITOT -- -- -- 0.8 0.8  PROT -- -- -- 7.1 8.3  ALBUMIN 2.0* 2.2* 2.2* -- --    Lab 04/13/12 1332  LIPASE 55  AMYLASE --   No results found for this basename: AMMONIA:3 in the last 168 hours CBC:  Lab 04/15/12 0905 04/14/12 0610 04/13/12 1332  WBC 9.2 11.4* 13.0*  NEUTROABS -- -- 7.3  HGB 9.0* 9.6* 11.1*  HCT 25.9* 26.6* 32.0*  MCV 69.4* 70.6* 71.1*  PLT 443* 507* 562*   Cardiac Enzymes:  Lab 04/13/12 1332  CKTOTAL --  CKMB --  CKMBINDEX --    TROPONINI <0.30   CBG:  Lab 04/18/12 0823 04/17/12 2055 04/17/12 1644 04/17/12 1141 04/17/12 0920  GLUCAP 133* 93 121* 110* 138*    Iron Studies: No results found for this basename: IRON,TIBC,TRANSFERRIN,FERRITIN in the last 72 hours Studies/Results: No results found.    Marland Kitchen atorvastatin  20 mg Oral QHS  . cholestyramine light  4 g Oral Custom  . feeding supplement  1 Container Oral TID BM  . heparin  5,000 Units Subcutaneous Q8H  . insulin aspart  0-9 Units Subcutaneous TID WC  . loperamide  2 mg Oral QID  . pantoprazole  40 mg Oral Q0600  . sodium chloride  3 mL Intravenous Q12H    BMET    Component Value Date/Time   NA 138 04/18/2012 0440   K 4.4 04/18/2012 0440   CL 112 04/18/2012 0440   CO2 20 04/18/2012 0440   GLUCOSE 98 04/18/2012 0440   BUN 45* 04/18/2012 0440   CREATININE 4.65* 04/18/2012 0440   CREATININE 1.12 07/04/2011 1516   CALCIUM  7.3* 04/18/2012 0440   GFRNONAA 12* 04/18/2012 0440   GFRAA 14* 04/18/2012 0440   CBC    Component Value Date/Time   WBC 9.2 04/15/2012 0905   RBC 3.73* 04/15/2012 0905   HGB 9.0* 04/15/2012 0905   HCT 25.9* 04/15/2012 0905   PLT 443* 04/15/2012 0905   MCV 69.4* 04/15/2012 0905   MCH 24.1* 04/15/2012 0905   MCHC 34.7 04/15/2012 0905   RDW 18.6* 04/15/2012 0905   LYMPHSABS 4.8* 04/13/2012 1332   MONOABS 0.7 04/13/2012 1332   EOSABS 0.1 04/13/2012 1332   BASOSABS 0.0 04/13/2012 1332    Assessment/Plan:  1. Acute renal failure/acute kidney injury-- (Creatinine was 1.0 on 03/09/12). Creatinine continues to improve. Continue to encourage po intake. Renal function improving but again volume is the main issue. He will need to be able to keep up his intake at home to match his output.   2. Diarrhea per ostomy--Now on peragoric and Imodium.  3. Prior history diabetes mellitus-with his second episode of acute renal failure in less than one month it is advisable to avoid metformin from here on as well as ACE/ARB until renal  function and diarrhea stabilize.  4. Prior history hypertension-is not hypertensive now. He had been on valsartan prior to admission. This should be avoided for now  5. Right hemicolectomy/anastomotic leak/intra-abdominal abscess-S./P. surgical correction(s). Surgery should be informed of his admission. They may have some insight into why he has had such severe diarrhea causing 2 admissions in 2 weeks. GI eval may also be helpful in slowing fecal loss. Need to r/o C. Diff etc. 6. Metabolic acidosis- resolved. 7. Hyperkalemia- resolved 8. dispo- per primary svc 9. Would not resume ACE/ARB or metformin upon discharge due to risks outweighing benefits. 10. Markedly improved overall.  Pt needs to keep up with his volume to prevent this from happening again.  Pt can f/u with our office if his creatinine doesn't return to baseline.  He will need labs to be drawn later this week with his PCP.  Will sign off. Call with questions or concerns. [place]

## 2012-04-19 LAB — GLUCOSE, CAPILLARY
Glucose-Capillary: 101 mg/dL — ABNORMAL HIGH (ref 70–99)
Glucose-Capillary: 108 mg/dL — ABNORMAL HIGH (ref 70–99)
Glucose-Capillary: 95 mg/dL (ref 70–99)
Glucose-Capillary: 116 mg/dL — ABNORMAL HIGH (ref 70–99)

## 2012-04-19 LAB — RENAL FUNCTION PANEL
Albumin: 2 g/dL — ABNORMAL LOW (ref 3.5–5.2)
BUN: 35 mg/dL — ABNORMAL HIGH (ref 6–23)
CO2: 17 mEq/L — ABNORMAL LOW (ref 19–32)
Calcium: 7.3 mg/dL — ABNORMAL LOW (ref 8.4–10.5)
Chloride: 116 mEq/L — ABNORMAL HIGH (ref 96–112)
Creatinine, Ser: 3.38 mg/dL — ABNORMAL HIGH (ref 0.50–1.35)
GFR calc Af Amer: 21 mL/min — ABNORMAL LOW (ref >90)
GFR calc non Af Amer: 18 mL/min — ABNORMAL LOW (ref >90)
Glucose, Bld: 84 mg/dL (ref 70–99)
Phosphorus: 3.4 mg/dL (ref 2.3–4.6)
Potassium: 4.3 mEq/L (ref 3.5–5.1)
Sodium: 141 mEq/L (ref 135–145)

## 2012-04-19 NOTE — Progress Notes (Addendum)
Subjective: Pt doing well. No complaints. He has been up and ambulating with nursing. Denies N/V/abdominal pain/dizziness.   Objective: Vital signs in last 24 hours: Filed Vitals:   04/18/12 1815 04/18/12 2133 04/19/12 0521 04/19/12 0745  BP: 114/80 126/71 131/64 135/68  Pulse: 84 77 73 73  Temp: 97.2 F (36.2 C) 98.3 F (36.8 C) 98 F (36.7 C) 98.5 F (36.9 C)  TempSrc: Oral Oral Oral Oral  Resp: 18 18 18 18   Height:      Weight:  217 lb (98.431 kg)    SpO2: 98% 100% 100% 100%   Weight change: 3 lb 3.2 oz (1.452 kg)  Intake/Output Summary (Last 24 hours) at 04/19/12 1053 Last data filed at 04/19/12 0740  Gross per 24 hour  Intake   5720 ml  Output   3910 ml  Net   1810 ml   Vitals reviewed. General: Sitting up in a chair watching TV, NAD HEENT: PERRL, EOMI, no scleral icterus Cardiac: RRR, no rubs, murmurs or gallops Pulm: Clear to auscultation bilaterally, no wheezes, rales, or rhonchi Abd: Soft, nontender, nondistended, BS present, abd wound with bandage with drainage, ostomy with thicker output today Ext: Warm and well perfused, no pedal edema Neuro: Alert and oriented X3, cranial nerves II-XII grossly intact  Lab Results: Basic Metabolic Panel:  Lab [id number] 0430 04/18/12 0440  NA 141 138  K 4.3 4.4  CL 116* 112  CO2 17* 20  GLUCOSE 84 98  BUN 35* 45*  CREATININE 3.38* 4.65*  CALCIUM 7.3* 7.3*  MG -- --  PHOS 3.4 3.8   Liver Function Tests:  Lab 04/19/12 0430 04/18/12 0440 04/14/12 0610 04/13/12 1332  AST -- -- 32 36  ALT -- -- 25 31  ALKPHOS -- -- 156* 192*  BILITOT -- -- 0.8 0.8  PROT -- -- 7.1 8.3  ALBUMIN 2.0* 2.0* -- --    Lab 04/13/12 1332  LIPASE 55  AMYLASE --   CBC:  Lab 04/15/12 0905 04/14/12 0610 04/13/12 1332  WBC 9.2 11.4* --  NEUTROABS -- -- 7.3  HGB 9.0* 9.6* --  HCT 25.9* 26.6* --  MCV 69.4* 70.6* --  PLT 443* 507* --   Cardiac Enzymes:  Lab 04/13/12 1332  CKTOTAL --  CKMB --  CKMBINDEX --  TROPONINI <0.30    CBG:  Lab 04/19/12 0746 04/18/12 2136 04/18/12 1705 04/18/12 1149 04/18/12 0823 04/17/12 2055  GLUCAP 101* 106* 124* 93 133* 93   Coagulation:  Lab 04/14/12 0610 04/13/12 1332  LABPROT 15.5* 14.6  INR 1.25 1.16   Urinalysis:  Lab 04/13/12 1547  COLORURINE AMBER*  LABSPEC 1.029  PHURINE 5.0  GLUCOSEU NEGATIVE  HGBUR NEGATIVE  BILIRUBINUR SMALL*  KETONESUR 15*  PROTEINUR >300*  UROBILINOGEN 1.0  NITRITE NEGATIVE  LEUKOCYTESUR SMALL*    Micro Results: Recent Results (from the past 240 hour(s))  URINE CULTURE     Status: Normal   Collection Time   04/13/12  3:47 PM      Component Value Range Status Comment   Specimen Description URINE, CLEAN CATCH   Final    Special Requests NONE   Final    Culture  Setup Time 04/13/2012 16:47   Final    Colony Count NO GROWTH   Final    Culture NO GROWTH   Final    Report Status 04/14/2012 FINAL   Final   CLOSTRIDIUM DIFFICILE BY PCR     Status: Normal   Collection Time   04/13/12  4:09 PM      Component Value Range Status Comment   C difficile by pcr NEGATIVE  NEGATIVE Final   STOOL CULTURE     Status: Normal   Collection Time   04/14/12  9:18 AM      Component Value Range Status Comment   Specimen Description STOOL   Final    Special Requests Normal   Final    Culture     Final    Value: NO SALMONELLA, SHIGELLA, CAMPYLOBACTER, YERSINIA, OR E.COLI 0157:H7 ISOLATED     Note: REDUCED NORMAL FLORA PRESENT   Report Status 04/18/2012 FINAL   Final    Studies/Results: No results found. Medications: I have reviewed the patient's current medications. Scheduled Meds:    . atorvastatin  20 mg Oral QHS  . cholestyramine light  4 g Oral Custom  . feeding supplement  1 Container Oral TID BM  . heparin  5,000 Units Subcutaneous Q8H  . insulin aspart  0-9 Units Subcutaneous TID WC  . loperamide  2 mg Oral QID  . pantoprazole  40 mg Oral Q0600  . sodium chloride  3 mL Intravenous Q12H   Continuous Infusions:    . sodium  chloride 150 mL/hr at 04/19/12 0400   PRN Meds:.acetaminophen, acetaminophen, ondansetron (ZOFRAN) IV  Assessment/Plan: Mr. Ackermann is a 61 yo gentleman with a history of type 2 diabetes on insulin, invasive adenocarcinoma of colon s/p right hemicolectomy complicated by postop intra-abdominal abscess with a leaking anastamosis, OSA, hypertension, who presents with nausea, vomiting, and dehydration resulting in reoccurrence of acute renal failure.   1. Acute renal failure: Recurrent. On admission, creatinine was 11.2 and BUN 84. Likely secondary to extreme volume depletion in the setting of nausea, vomiting, increased ileostomy output. In his previous admission, his BUN was 156 and creatinine 17. Bicarbonate was 16 and potassium 6.5. It was likely related to his IV contrast used for abdominal CT at last admission. He was treated with IV fluids and left the hospital on 04/05/12 with a creatinine of 3.6. Of note, the patient's creatinine was 1.0 on 03/09/12. On this admission, the patient was a severely volume depleted and acidotic with an anion gap of 18.  Metabolic acidosis likely due to renal failure secondary to dehydration. Renal U/S showed no obstruction, bilateral renal cysts noted. Renal was consulted, Dr. Hassell Done evaluated the patient and suggested treating with volume resuscitation and alkalai therapy. Appreciate Renal evaluation and recommendations. Per Renal recs, he was started on 1/2 NS with 75 mEq bicarbonate at 250 cc an hour x 8 hours then 120 cc per hour. His AG on HD #2 was still elevated at 17, and we continued the fluids per Renal. His AG closed on 11/14. FeNa <<< 1.0. Cr 3.38 with GFR 21 today. Renal recommended continuing the fluids at least until today; no HD. UOP improving daily, foley is out. Renal has signed off. - Strict I and O's, daily weights  - Zofran PRN  - Follow BMP   2. Hyperkalemia: Resolved. K was 5.4 on admission without EKG changes. Patient received 1 dose of Kayexalate, 15  g in ED. On repeat BMP, potassium 4.9, 4.3 this morning. Will continue to monitor. - am BMP   Lab 04/19/12 0430 04/18/12 0440 04/17/12 0625 04/16/12 0555 04/15/12 0600  K 4.3 4.4 4.4 4.0 4.0    3. Colon adenocarcinoma s/p right hemicolectomy and ileostomy: Initial surgery 02/06/12, and unfortunately he developed an anastomotic leak with intraabdominal abscess formation post-op with return  to the OR for multiple washouts, and subsequent abdominal wound closure on 02/26/12. S/p prior wound vac in place which was removed on 03/10/12. His wound was healing nicely at previous d/c, and continues to heal well.  At this admission, his ileostomy site appears clear, with liquid output. He previous CT scan abd/pelvis showed inflammation of omental fat, no focal abscess noted, no SBO. Pt with abdominal pain to deep palpation around the incision, no rebound or guarding. Does not appear septic. Dr. [name] saw the pt in the hospital and felt the wound was healing well. - Continue Ileostomy care   4. Nausea, vomiting and diarrhea: Resolved. Patient has been on Levaquin recently, which puts patient at risk of getting C. difficile colitis, but Cdiff PCR negative. Patient had elevated lipase in the past,143 on 03/29/12. Lipase 55 on admission. Tolerating diet, but still with limited po intake, but improving. Will consider PICC for IVF for d/c. Dr. [name] saw the pt and recommended GI consult for the watery output from his ostomy. GI recommended to continue PPI, IVF, Imodium, and started Questran. Since beginning the Questran, his ostomy output has thickened up. Pt also on Paregoric.  GI recommended monitoring the pt's ileostomy output over the next few days.  - Continue Questran, Imodium, and Peragoric. - Renal diet - F/u GI  5. Leukocytosis: Resolved. WBC 13 on admission, 9.2 at last check. Hx of UTI during last admission but pt only received 4 doses of 14 of Levaquin therapy b/c he couldn't afford the medication after  discharge. Patient specifically denies dysuria, fever, chills. Still has a leukocytosis, may have been from uncleared UTI, stress reaction, AKI. Urine cx grew pan sensitive E. coli at last admission. C. Diff, stool cx, and urine cx negative. Continued Levaquin to treat as complicated UTI in a male, but it was d/c'd on day 3 as Ucx neg.  Lab 04/15/12 0905 04/14/12 0610 04/13/12 1332  WBC 9.2 11.4* 13.0*    6. DM-II: Last A1c was 6.2 at 03/02/12. Patient has been on Metformin and Novolin 70/30 at home. Holding Metformin as pt with metabolic acidosis and renal failure.  Placed on Lantus 10u with SSI. Lantus held 2x and CBGs stable, so stopped Lantus. Will continue SSI, but pt has not required any insulin.  - Continue SSI  Lab 04/19/12 0746 04/18/12 2136 04/18/12 1705 04/18/12 1149 04/18/12 0823  GLUCAP 101* 106* 124* 93 133*    7. HTN: Hypotensive on admission with blood pressure of 92/59. Improving with IVF and inceased po intake. Today 135/68.  8. PPX: [place] [facility]arin  9. Dispo: Waiting to hear from SW re placement. Pt will likely need SNF. PT recommending 24hr supervision, and the pt's daughter Joseph Art agreed that he needed to go to a facility and not home.     LOS: 6 days   Otho Bellows 04/19/2012, 10:53 AM

## 2012-04-19 NOTE — Progress Notes (Signed)
Patient ID: Ileana Ladd, male   DOB: 10-Mar-1951, 61 y.o.   MRN: [id number] [facility] Progress Note  Subjective: He is doing well-eating and drinking well, no c/o abdominal pain. Ileostomy out put has decreased significantly- 860 cc past 24 hours, but this is with ,questran and imodium scheduled doses He is also still receiving IV fluids  Notes relate that daughter considering NHP- pt says he is going home, and daughter does not make his  Decisions.  Objective:  Vital signs in last 24 hours: Temp:  [97.2 F (36.2 C)-98.5 F (36.9 C)] 98.5 F (36.9 C) (11/17 0745) Pulse Rate:  [73-84] 73  (11/17 0745) Resp:  [18] 18  (11/17 0745) BP: (100-135)/(64-80) 135/68 mmHg (11/17 0745) SpO2:  [98 %-100 %] 100 % (11/17 0745) Weight:  [217 lb (98.431 kg)] 217 lb (98.431 kg) (11/16 2133) Last BM Date: 04/19/12 General:   Alert,  Well-developed,    in NAD Heart:  Regular rate and rhythm; no murmurs Pulm;clear Abdomen:  Soft, nontender and nondistended. Normal bowel sounds,  Extremities:  Without edema. Neurologic:  Alert and  oriented x4;  grossly normal neurologically. Psych:  Alert and cooperative. Normal mood and affect.  Intake/Output from previous day: 11/16 0701 - 11/17 0700 In: 5720 [P.O.:1920; I.V.:3800] Out: 4610 [Urine:3750; Stool:860] Intake/Output this shift: Total I/O In: 360 [P.O.:360] Out: 425 [Urine:200; Stool:225]  Lab Results: No results found for this basename: WBC:3,HGB:3,HCT:3,PLT:3 in the last 72 hours BMET  [facility] 04/19/12 0430 04/18/12 0440 04/17/12 0625  NA 141 138 136  K 4.3 4.4 4.4  CL 116* 112 106  CO2 17* 20 22  GLUCOSE 84 98 88  BUN 35* 45* 58*  CREATININE 3.38* 4.65* 6.34*  CALCIUM 7.3* 7.3* 7.5*   LFT  Basename 04/19/12 0430  PROT --  ALBUMIN 2.0*  AST --  ALT --  ALKPHOS --  BILITOT --  BILIDIR --  IBILI --   Assessment / Plan:  #1  61 yo male s/p  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  Now 2 admission due to renal failure/dehydration from high ileostomy output. Day # 6 hospital stay-  Much improved on current regimen. #2 renal failure- continued improvement but not back to baseline yet #3 AODM  He is not ready for discharge but hopefully this week. He will need to prove he can maintain off IV fluids etc. Would continue antidiarrheal regimen ,that he is currently on at discharge. Will need close outpt follow up, our office will arrange follow up with [name]  In a couple weeks, but he will need  Seen by IM within a week of discharge. Principal Problem:  *Short gut syndrome Active Problems:  Dehydration     LOS: 6 days   Amy Esterwood  04/19/2012, 11:32 AM    GI ATTENDING  Interval history and labs reviewed. He continues to make good improvement. Renal function improving. Oral intake and ostomy output reasonable. Agree that he needs to meet the above parameters for discharge. As well we will need a close followup. We will range as outlined. We're available as needed in the interim. Will sign off. Thank you  Docia Chuck. Geri Seminole., M.D. [facility] Division of Gastroenterology .

## 2012-04-20 DIAGNOSIS — E119 Type 2 diabetes mellitus without complications: Secondary | ICD-10-CM

## 2012-04-20 DIAGNOSIS — E876 Hypokalemia: Secondary | ICD-10-CM

## 2012-04-20 DIAGNOSIS — I10 Essential (primary) hypertension: Secondary | ICD-10-CM

## 2012-04-20 LAB — GLUCOSE, CAPILLARY
Glucose-Capillary: 89 mg/dL (ref 70–99)
Glucose-Capillary: 122 mg/dL — ABNORMAL HIGH (ref 70–99)
Glucose-Capillary: 57 mg/dL — ABNORMAL LOW (ref 70–99)
Glucose-Capillary: 96 mg/dL (ref 70–99)
Glucose-Capillary: 110 mg/dL — ABNORMAL HIGH (ref 70–99)

## 2012-04-20 LAB — RENAL FUNCTION PANEL
Albumin: 2.1 g/dL — ABNORMAL LOW (ref 3.5–5.2)
BUN: 30 mg/dL — ABNORMAL HIGH (ref 6–23)
CO2: 16 mEq/L — ABNORMAL LOW (ref 19–32)
Calcium: 7.4 mg/dL — ABNORMAL LOW (ref 8.4–10.5)
Chloride: 113 mEq/L — ABNORMAL HIGH (ref 96–112)
Creatinine, Ser: 2.84 mg/dL — ABNORMAL HIGH (ref 0.50–1.35)
GFR calc Af Amer: 26 mL/min — ABNORMAL LOW (ref >90)
GFR calc non Af Amer: 23 mL/min — ABNORMAL LOW (ref >90)
Glucose, Bld: 74 mg/dL (ref 70–99)
Phosphorus: 3.7 mg/dL (ref 2.3–4.6)
Potassium: 4.2 mEq/L (ref 3.5–5.1)
Sodium: 140 mEq/L (ref 135–145)

## 2012-04-20 NOTE — Progress Notes (Addendum)
Subjective: Pt doing well. No complaints. He has been up and ambulating with nursing. Denies N/V/abdominal pain/dizziness.   Objective: Vital signs in last 24 hours: Filed Vitals:   04/19/12 1350 04/19/12 1800 04/19/12 2126 04/20/12 0651  BP: 117/68 122/73 122/64 139/66  Pulse: 68 66 75 81  Temp: 98.1 F (36.7 C) 97.1 F (36.2 C) 97.7 F (36.5 C) 98.5 F (36.9 C)  TempSrc: Oral Oral Oral Oral  Resp: 18 18 18 18   Height:      Weight:   220 lb 10.9 oz (100.1 kg)   SpO2: 100% 100% 100% 100%   Weight change: 3 lb 10.9 oz (1.67 kg)  Intake/Output Summary (Last 24 hours) at 04/20/12 0819 Last data filed at 04/20/12 0653  Gross per 24 hour  Intake    240 ml  Output   1750 ml  Net  -1510 ml   Vitals reviewed. General: Sitting up in bed watching TV, NAD HEENT: PERRL, EOMI, no scleral icterus Cardiac: RRR, no rubs, murmurs or gallops Pulm: Clear to auscultation bilaterally, no wheezes, rales, or rhonchi Abd: Soft, nontender, nondistended, BS present, abd wound with bandage, ostomy with thicker output today Ext: Warm and well perfused, no pedal edema Neuro: Alert and oriented X3, cranial nerves II-XII grossly intact  Lab Results: Basic Metabolic Panel:  Lab [id number] 04/19/12 0430  NA 140 141  K 4.2 4.3  CL 113* 116*  CO2 16* 17*  GLUCOSE 74 84  BUN 30* 35*  CREATININE 2.84* 3.38*  CALCIUM 7.4* 7.3*  MG -- --  PHOS 3.7 3.4   Liver Function Tests:  Lab 04/20/12 0434 04/19/12 0430 04/14/12 0610 04/13/12 1332  AST -- -- 32 36  ALT -- -- 25 31  ALKPHOS -- -- 156* 192*  BILITOT -- -- 0.8 0.8  PROT -- -- 7.1 8.3  ALBUMIN 2.1* 2.0* -- --    Lab 04/13/12 1332  LIPASE 55  AMYLASE --   CBC:  Lab 04/15/12 0905 04/14/12 0610 04/13/12 1332  WBC 9.2 11.4* --  NEUTROABS -- -- 7.3  HGB 9.0* 9.6* --  HCT 25.9* 26.6* --  MCV 69.4* 70.6* --  PLT 443* 507* --   Cardiac Enzymes:  Lab 04/13/12 1332  CKTOTAL --  CKMB --  CKMBINDEX --  TROPONINI <0.30    CBG:  Lab 04/20/12 0741 04/19/12 2132 04/19/12 1703 04/19/12 1151 04/19/12 0746 04/18/12 2136  GLUCAP 89 116* 95 108* 101* 106*   Coagulation:  Lab 04/14/12 0610 04/13/12 1332  LABPROT 15.5* 14.6  INR 1.25 1.16   Urinalysis:  Lab 04/13/12 1547  COLORURINE AMBER*  LABSPEC 1.029  PHURINE 5.0  GLUCOSEU NEGATIVE  HGBUR NEGATIVE  BILIRUBINUR SMALL*  KETONESUR 15*  PROTEINUR >300*  UROBILINOGEN 1.0  NITRITE NEGATIVE  LEUKOCYTESUR SMALL*    Micro Results: Recent Results (from the past 240 hour(s))  URINE CULTURE     Status: Normal   Collection Time   04/13/12  3:47 PM      Component Value Range Status Comment   Specimen Description URINE, CLEAN CATCH   Final    Special Requests NONE   Final    Culture  Setup Time 04/13/2012 16:47   Final    Colony Count NO GROWTH   Final    Culture NO GROWTH   Final    Report Status 04/14/2012 FINAL   Final   CLOSTRIDIUM DIFFICILE BY PCR     Status: Normal   Collection Time   04/13/12  4:09  PM      Component Value Range Status Comment   C difficile by pcr NEGATIVE  NEGATIVE Final   STOOL CULTURE     Status: Normal   Collection Time   04/14/12  9:18 AM      Component Value Range Status Comment   Specimen Description STOOL   Final    Special Requests Normal   Final    Culture     Final    Value: NO SALMONELLA, SHIGELLA, CAMPYLOBACTER, YERSINIA, OR E.COLI 0157:H7 ISOLATED     Note: REDUCED NORMAL FLORA PRESENT   Report Status 04/18/2012 FINAL   Final    Studies/Results: No results found. Medications: I have reviewed the patient's current medications. Scheduled Meds:    . atorvastatin  20 mg Oral QHS  . cholestyramine light  4 g Oral Custom  . feeding supplement  1 Container Oral TID BM  . heparin  5,000 Units Subcutaneous Q8H  . insulin aspart  0-9 Units Subcutaneous TID WC  . loperamide  2 mg Oral QID  . pantoprazole  40 mg Oral Q0600  . sodium chloride  3 mL Intravenous Q12H   Continuous Infusions:    .  [DISCONTINUED] sodium chloride 150 mL/hr at 04/19/12 0400   PRN Meds:.acetaminophen, acetaminophen, ondansetron (ZOFRAN) IV  Assessment/Plan: [name] is a 61 yo gentleman with a history of type 2 diabetes on insulin, invasive adenocarcinoma of colon s/p right hemicolectomy complicated by postop intra-abdominal abscess with a leaking anastamosis, OSA, hypertension, who presents with nausea, vomiting, and dehydration resulting in reoccurrence of acute renal failure.   1. Acute renal failure: Recurrent. On admission, creatinine was 11.2 and BUN 84. Likely secondary to extreme volume depletion in the setting of nausea, vomiting, increased ileostomy output. In his previous admission, his BUN was 156 and creatinine 17. Bicarbonate was 16 and potassium 6.5. It was likely related to his IV contrast used for abdominal CT at last admission. He was treated with IV fluids and left the hospital on 04/05/12 with a creatinine of 3.6. Of note, the patient's creatinine was 1.0 on 03/09/12. On this admission, the patient was a severely volume depleted and acidotic with an anion gap of 18.  Metabolic acidosis likely due to renal failure secondary to dehydration. Renal U/S showed no obstruction, bilateral renal cysts noted. Renal was consulted, Dr. Hassell Done evaluated the patient and suggested treating with volume resuscitation and alkalai therapy. Appreciate Renal evaluation and recommendations. Per Renal recs, he was started on 1/2 NS with 75 mEq bicarbonate at 250 cc an hour x 8 hours then 120 cc per hour. His AG on HD #2 was still elevated at 17, and we continued the fluids per Renal. His AG closed on 11/14. FeNa <<< 1.0. Renal recommended continuing the fluids until yesterday; no HD has been required. Foley removed 4 days ago, UOP improving daily. IVF were d/c'd yesterday and Cr 2.84 with GFR 26 today.  Renal has signed off. - Strict I and O's, daily weights  - Zofran PRN  - Follow BMP   Lab 04/20/12 0434 04/19/12 0430  04/18/12 0440 04/17/12 0625 04/16/12 0555  CREATININE 2.84* 3.38* 4.65* 6.34* 8.97*     2. Hyperkalemia: Resolved. K was 5.4 on admission without EKG changes. Patient received 1 dose of Kayexalate, 15 g in ED. On repeat BMP, potassium 4.9, 4.2 this morning. Will continue to monitor. - am BMP   Lab 04/20/12 0434 04/19/12 0430 04/18/12 0440 04/17/12 0625 04/16/12 0555  K 4.2 4.3  4.4 4.4 4.0    3. 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 S/p prior wound vac in place which was removed on 03/10/12. His wound was healing nicely at previous d/c, and continues to heal well.  At this admission, his ileostomy site appears clear, with liquid output. He previous CT scan abd/pelvis showed inflammation of omental fat, no focal abscess noted, no SBO. Pt with abdominal pain to deep palpation around the incision, no rebound or guarding. Does not appear septic. Dr. [name] saw the pt in the hospital and felt the wound was healing well. - Continue Ileostomy care   4. Nausea, vomiting and diarrhea: Resolved. Patient has been on Levaquin recently, which put him at risk of getting C. difficile colitis, but Cdiff PCR negative. Patient had elevated lipase in the past,143 on 03/29/12. Lipase 55 on admission. Tolerating diet,l with improved po intake. Will consider PICC for IVF for d/c. Dr. [name] saw the pt and recommended GI consult for the watery output from his ostomy, but if his ostomy output is improved and he continues with good po intake, he might not need one. GI recommended to continue PPI, IVF, Imodium, and started Questran. Since beginning the Questran, his ostomy output has thickened up. Pt was on Paregoric, not sure why it was stopped.  Per GI note, monitoring the pt's ileostomy output  over the next few days.  - Continue Questran and Imodium. - Renal diet - F/u GI  5. Leukocytosis: Resolved. WBC 13 on admission, 9.2 at last check. Hx of UTI during last admission but pt only received 4 doses of 14 of Levaquin therapy b/c he couldn't afford the medication after discharge. Patient specifically denies dysuria, fever, chills. Still has a leukocytosis, may have been from uncleared UTI, stress reaction, AKI. Urine cx grew pan sensitive E. coli at last admission. C. Diff, stool cx, and urine cx negative. Continued Levaquin to treat as complicated UTI in a male, but it was d/c'd on day 3 as Ucx neg.  Lab 04/15/12 0905 04/14/12 0610 04/13/12 1332  WBC 9.2 11.4* 13.0*    6. DM-II: Last A1c was 6.2 at 03/02/12. Patient has been on Metformin and Novolin 70/30 at home. Holding Metformin as pt with metabolic acidosis and renal failure.  Placed on Lantus 10u with SSI. Lantus held 2x and CBGs stable, so stopped Lantus. Will continue SSI, but pt has not required any insulin.  - Continue SSI  Lab 04/20/12 0741 04/19/12 2132 04/19/12 1703 04/19/12 1151 04/19/12 0746  GLUCAP 89 116* 95 108* 101*    7. HTN: Hypotensive on admission with blood pressure of 92/59. Improving with IVF and inceased po intake. Today 139/66.  8. PPX: [place] Heparin  9. Dispo: Waiting to hear from SW re: placement. He is doing well off the IVF and his ostomy output has slowed down. He might be able to go home.     LOS: 7 days   Otho Bellows 04/20/2012, 8:19 AM

## 2012-04-20 NOTE — Progress Notes (Signed)
Hypoglycemic Event  CBG: 56  Treatment: 15 GM carbohydrate snack  Symptoms: None  Follow-up CBG: Time:1640 CBG Result: 96  Possible Reasons for Event: Unknown  Comments/MD notified:Glenn, K    Collins, Shaunita Seney Nicole  Remember to initiate Hypoglycemia Order Set & complete

## 2012-04-20 NOTE — Progress Notes (Signed)
Patient ID: [name], male   DOB: 19-Oct-1950, 61 y.o.   MRN: [id number]  Have noted the recent progress notes.  I am assigned to [facility] all week, but if any surgical issues arise, please don't hesitate to call.  Wound - continue wet to dry dressings daily Ostomy - it seems that the output has slowed down with the current regimen of Questran and Imodium per GI recommendations.  The Paregoric was discontinued apparently.  Will need close follow-up to make sure that he is not becoming volume-depleted.  I need to see him in my office for a wound check in 2-3 weeks after discharge.  Imogene Burn. [name], MD, [facility]  04/20/2012 7:57 AM

## 2012-04-20 NOTE — Progress Notes (Signed)
Physical Therapy Treatment Patient Details Name: [name] MRN: [id number] DOB: [birth year] Today's Date: 04/20/2012 Time: [id number] PT Time Calculation (min): 20 min  PT Assessment / Plan / Recommendation Comments on Treatment Session  Continuing improvements in mobility, especially ambulation distance; Pt hopeful to go home soon    Follow Up Recommendations  Home health PT;Supervision/Assistance - 24 hour     Does the patient have the potential to tolerate intense rehabilitation     Barriers to Discharge        Equipment Recommendations  None recommended by PT    Recommendations for Other Services    Frequency Min 3X/week   Plan Discharge plan remains appropriate;Frequency remains appropriate    Precautions / Restrictions Precautions Precautions: Fall Precaution Comments: Risk of falls greatly decreased with use of RW Restrictions Weight Bearing Restrictions: No   Pertinent Vitals/Pain no apparent distress     Mobility  Bed Mobility Details for Bed Mobility Assistance: Discussed using log roll technique, and pushing through UEs to go supine to sit to minimize abdominal pain; Pt verbalized understanding and chose not to practice this session Transfers Transfers: Sit to Stand;Stand to Sit Sit to Stand: 6: Modified independent (Device/Increase time);With upper extremity assist;From bed Stand to Sit: 6: Modified independent (Device/Increase time);To bed;With upper extremity assist Details for Transfer Assistance: managing hand placement well without prompting  Ambulation/Gait Ambulation/Gait Assistance: 5: Supervision;4: Min assist Ambulation Distance (Feet): 200 Feet Assistive device: None;Rolling walker Ambulation/Gait Assistance Details: Pt opted to amb most of walk today without RW; occasionally using Right hallway rail, though not completely dependent on it; We discussed using the RW for stability, especially when he has abdominal pain; One instance of loss of  balance requiring min assist to steady self when a nurse walked quite closely by pt quickly Gait velocity: slight slowing Stairs: Yes Stairs Assistance: 5: Supervision Stairs Assistance Details (indicate cue type and reason): Manages stairs quite well; cues for steadiness and control Stair Management Technique: Two rails;Alternating pattern;Step to pattern (alternating pattern ascending; step-to descending) Number of Stairs: 5     Exercises     PT Diagnosis:    PT Problem List:   PT Treatment Interventions:     PT Goals Acute Rehab PT Goals Time For Goal Achievement: 04/24/12 Potential to Achieve Goals: Good Pt will go Sit to Stand: Independently PT Goal: Sit to Stand - Progress: Progressing toward goal Pt will go Stand to Sit: Independently PT Goal: Stand to Sit - Progress: Progressing toward goal Pt will Ambulate: >150 feet;with supervision;with rolling walker PT Goal: Ambulate - Progress: Progressing toward goal Pt will Go Up / Down Stairs: 1-2 stairs;with rail(s);with min assist PT Goal: Up/Down Stairs - Progress: Met  Visit Information  Last PT Received On: 04/20/12 Assistance Needed: +1    Subjective Data  Subjective: Hoping to go home soon; States he uses RW when walking independently Patient Stated Goal: "I need to take care of myself and walk".     Cognition  Overall Cognitive Status: Appears within functional limits for tasks assessed/performed Arousal/Alertness: Awake/alert Orientation Level: Appears intact for tasks assessed Behavior During Session: [facility] for tasks performed    Balance     End of Session PT - End of Session Equipment Utilized During Treatment: Gait belt Activity Tolerance: Patient tolerated treatment well Patient left: in chair;with call bell/phone within reach Nurse Communication: Mobility status   GP     Quin Hoop [place], [place]  04/20/2012, 12:02 PM

## 2012-04-21 DIAGNOSIS — K929 Disease of digestive system, unspecified: Secondary | ICD-10-CM

## 2012-04-21 LAB — RENAL FUNCTION PANEL
Albumin: 2.5 g/dL — ABNORMAL LOW (ref 3.5–5.2)
BUN: 24 mg/dL — ABNORMAL HIGH (ref 6–23)
CO2: 16 mEq/L — ABNORMAL LOW (ref 19–32)
Calcium: 8 mg/dL — ABNORMAL LOW (ref 8.4–10.5)
Chloride: 113 mEq/L — ABNORMAL HIGH (ref 96–112)
Creatinine, Ser: 2.41 mg/dL — ABNORMAL HIGH (ref 0.50–1.35)
GFR calc Af Amer: 32 mL/min — ABNORMAL LOW (ref >90)
GFR calc non Af Amer: 28 mL/min — ABNORMAL LOW (ref >90)
Glucose, Bld: 109 mg/dL — ABNORMAL HIGH (ref 70–99)
Phosphorus: 3.6 mg/dL (ref 2.3–4.6)
Potassium: 4.2 mEq/L (ref 3.5–5.1)
Sodium: 139 mEq/L (ref 135–145)

## 2012-04-21 LAB — GLUCOSE, CAPILLARY
Glucose-Capillary: 143 mg/dL — ABNORMAL HIGH (ref 70–99)
Glucose-Capillary: 124 mg/dL — ABNORMAL HIGH (ref 70–99)
Glucose-Capillary: 118 mg/dL — ABNORMAL HIGH (ref 70–99)
Glucose-Capillary: 96 mg/dL (ref 70–99)

## 2012-04-21 NOTE — Progress Notes (Signed)
Nutrition Follow-up  Intervention:  Pt is eating well at this time. Continue current interventions.  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 GI saw pt and added questran and protonix. Per chart, pt with thickened output since addition of questran.  Taking Breeze as scheduled. Eating 100% of meals.   Hypoglycemic event 11/13, 11/15, 11/18.  Diet Order:  Renal 60 - 70 Supplement: Resource Breeze PO TID  Meds: Scheduled Meds:   . atorvastatin  20 mg Oral QHS  . cholestyramine light  4 g Oral Custom  . feeding supplement  1 Container Oral TID BM  . heparin  5,000 Units Subcutaneous Q8H  . insulin aspart  0-9 Units Subcutaneous TID WC  . loperamide  2 mg Oral QID  . pantoprazole  40 mg Oral Q0600  . sodium chloride  3 mL Intravenous Q12H   Continuous Infusions:  PRN Meds:.acetaminophen, acetaminophen, ondansetron (ZOFRAN) IV   CMP     Component Value Date/Time   NA 140 04/20/2012 0434   K 4.2 04/20/2012 0434   CL 113* 04/20/2012 0434   CO2 16* 04/20/2012 0434   GLUCOSE 74 04/20/2012 0434   BUN 30* 04/20/2012 0434   CREATININE 2.84* 04/20/2012 0434   CREATININE 1.12 07/04/2011 1516   CALCIUM 7.4* 04/20/2012 0434   PROT 7.1 04/14/2012 0610   ALBUMIN 2.1* 04/20/2012 0434   AST 32 04/14/2012 0610   ALT 25 04/14/2012 0610   ALKPHOS 156* 04/14/2012 0610   BILITOT 0.8 04/14/2012 0610   GFRNONAA 23* 04/20/2012 0434   GFRAA 26* 04/20/2012 0434    CBG (last 3)   Basename 04/21/12 0826 04/20/12 2108 04/20/12 1640  GLUCAP 143* 110* 96   Phosphorus  Date/Time Value Range Status  04/20/2012  4:34 AM 3.7  2.3 - 4.6 mg/dL Final   Magnesium  Date/Time Value Range Status  03/03/2012  6:10 AM 1.8  1.5 - 2.5 mg/dL Final     Intake/Output Summary (Last 24 hours) at 04/21/12 1104 Last data filed at 04/21/12 0900  Gross per 24 hour  Intake    600 ml  Output   1600 ml  Net  -1000 ml  BM: 11/18  Weight  Status:  226 lb - wt trending up significantly since admission  Estimated needs:  2150 - 2350 kcal, 105 - 120 grams protien  Nutrition Dx:  Inadequate oral intake r/t GI distress AEB ongoing significant wt loss and poor PO intake. Resolved.  Goal:  Pt to meet >/= 90% of their estimated nutrition needs - met  Monitor:  weight trends, lab trends, I/O's, PO intake, supplement tolerance  Inda Coke MS, RD, LDN Pager: [phone] After-hours pager: [phone]

## 2012-04-21 NOTE — Progress Notes (Signed)
Subjective: Pt doing well. No complaints. He is ready to go home. He has been up and ambulating with PT. Denies N/V/abdominal pain/dizziness.   Objective: Vital signs in last 24 hours: Filed Vitals:   04/20/12 1332 04/20/12 2103 04/21/12 0504 04/21/12 0921  BP: 120/65 123/64 132/73 130/50  Pulse: 70 75 79 86  Temp: 98.3 F (36.8 C) 99 F (37.2 C) 97.8 F (36.6 C) 97.4 F (36.3 C)  TempSrc: Oral Oral Oral Oral  Resp: 18 18 18 18   Height:      Weight:  226 lb 6.6 oz (102.7 kg)    SpO2: 100% 100% 99% 98%   Weight change: 5 lb 11.7 oz (2.6 kg)  Intake/Output Summary (Last 24 hours) at 04/21/12 1147 Last data filed at 04/21/12 0900  Gross per 24 hour  Intake    600 ml  Output   1600 ml  Net  -1000 ml   Vitals reviewed. General: Sitting in chair TV, NAD HEENT: PERRL, EOMI, no scleral icterus Cardiac: RRR, no rubs, murmurs or gallops Pulm: Clear to auscultation bilaterally, no wheezes, rales, or rhonchi Abd: Soft, nontender, nondistended, BS present, abd wound with bandage, new ostomy bag in place Ext: Warm and well perfused, no pedal edema Neuro: Alert and oriented X3, cranial nerves II-XII grossly intact  Lab Results: Basic Metabolic Panel:  Lab [id number] 0925 04/20/12 0434  NA 139 140  K 4.2 4.2  CL 113* 113*  CO2 16* 16*  GLUCOSE 109* 74  BUN 24* 30*  CREATININE 2.41* 2.84*  CALCIUM 8.0* 7.4*  MG -- --  PHOS 3.6 3.7   Liver Function Tests:  Lab 04/21/12 0925 04/20/12 0434  AST -- --  ALT -- --  ALKPHOS -- --  BILITOT -- --  PROT -- --  ALBUMIN 2.5* 2.1*   CBC:  Lab 04/15/12 0905  WBC 9.2  NEUTROABS --  HGB 9.0*  HCT 25.9*  MCV 69.4*  PLT 443*   CBG:  Lab 04/21/12 0826 04/20/12 2108 04/20/12 1640 04/20/12 1616 04/20/12 1130 04/20/12 0741  GLUCAP 143* 110* 96 57* 122* 89    Micro Results: Recent Results (from the past 240 hour(s))  URINE CULTURE     Status: Normal   Collection Time   04/13/12  3:47 PM      Component Value Range Status  Comment   Specimen Description URINE, CLEAN CATCH   Final    Special Requests NONE   Final    Culture  Setup Time 04/13/2012 16:47   Final    Colony Count NO GROWTH   Final    Culture NO GROWTH   Final    Report Status 04/14/2012 FINAL   Final   CLOSTRIDIUM DIFFICILE BY PCR     Status: Normal   Collection Time   04/13/12  4:09 PM      Component Value Range Status Comment   C difficile by pcr NEGATIVE  NEGATIVE Final   STOOL CULTURE     Status: Normal   Collection Time   04/14/12  9:18 AM      Component Value Range Status Comment   Specimen Description STOOL   Final    Special Requests Normal   Final    Culture     Final    Value: NO SALMONELLA, SHIGELLA, CAMPYLOBACTER, YERSINIA, OR E.COLI 0157:H7 ISOLATED     Note: REDUCED NORMAL FLORA PRESENT   Report Status 04/18/2012 FINAL   Final    Studies/Results: No results found. Medications: I  have reviewed the patient's current medications. Scheduled Meds:    . atorvastatin  20 mg Oral QHS  . cholestyramine light  4 g Oral Custom  . feeding supplement  1 Container Oral TID BM  . heparin  5,000 Units Subcutaneous Q8H  . insulin aspart  0-9 Units Subcutaneous TID WC  . loperamide  2 mg Oral QID  . pantoprazole  40 mg Oral Q0600  . sodium chloride  3 mL Intravenous Q12H   Continuous Infusions:   PRN Meds:.acetaminophen, acetaminophen, ondansetron (ZOFRAN) IV  Assessment/Plan: [name] is a 61 yo gentleman with a history of type 2 diabetes on insulin, invasive adenocarcinoma of colon s/p right hemicolectomy complicated by postop intra-abdominal abscess with a leaking anastamosis, OSA, hypertension, who presents with nausea, vomiting, and dehydration resulting in reoccurrence of acute renal failure.   1. Acute renal failure: Recurrent. On admission, creatinine was 11.2 and BUN 84. Likely secondary to extreme volume depletion in the setting of nausea, vomiting, increased ileostomy output. In his previous admission, his BUN was 156  and creatinine 17. Bicarbonate was 16 and potassium 6.5. It was likely related to his IV contrast used for abdominal CT at last admission. He was treated with IV fluids and left the hospital on 04/05/12 with a creatinine of 3.6. Of note, the patient's creatinine was 1.0 on 03/09/12. On this admission, the patient was a severely volume depleted and acidotic with an anion gap of 18.  Metabolic acidosis likely due to renal failure secondary to dehydration. Renal U/S showed no obstruction, bilateral renal cysts noted. Renal was consulted, Dr. Hassell Done evaluated the patient and suggested treating with volume resuscitation and alkalai therapy. Appreciate Renal evaluation and recommendations. Per Renal recs, he was started on 1/2 NS with 75 mEq bicarbonate at 250 cc an hour x 8 hours then 120 cc per hour. His AG on HD #2 was still elevated at 17, and we continued the fluids per Renal. His AG closed on 11/14. FeNa <<< 1.0. Renal recommended continuing the fluids until yesterday; no HD has been required. Foley removed 4 days ago, UOP good. IVF were d/c'd two days ago and renal function continues to improve with Cr 2.41 and GFR 32 today.  Renal has signed off. - Strict I and O's, daily weights  - Follow BMP   Lab 04/21/12 0925 04/20/12 0434 04/19/12 0430 04/18/12 0440 04/17/12 0625  CREATININE 2.41* 2.84* 3.38* 4.65* 6.34*     2. Hyperkalemia: Resolved; potassium stable. K was 5.4 on admission without EKG changes. Patient received 1 dose of Kayexalate, 15 g in ED. On repeat BMP, potassium 4.9, 4.2 this morning. Will continue to monitor. - am BMP   Lab 04/21/12 0925 04/20/12 0434 04/19/12 0430 04/18/12 0440 04/17/12 0625  K 4.2 4.2 4.3 4.4 4.4    3. 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 S/p prior wound vac in place which was removed on 03/10/12. His wound was healing nicely at previous d/c, and continues to heal well.  At this admission, his ileostomy site appears clear, with liquid output. He previous CT scan abd/pelvis showed inflammation of omental fat, no focal abscess noted, no SBO. Pt with abdominal pain to deep palpation around the incision, no rebound or guarding. Does not appear septic. Dr. [name] saw the pt in the hospital and felt  the wound was healing well. - Continue Ileostomy care   4. Nausea, vomiting, and diarrhea: Resolved. Patient has been on Levaquin recently, which put him at risk of getting C. difficile colitis, but Cdiff PCR negative. Patient had elevated lipase in the past,143 on 03/29/12. Lipase 55 on admission. Tolerating diet, with improved po intake. Will consider PICC for IVF for d/c. Dr. [name] saw the pt and recommended GI consult for the watery output from his ostomy, but if his ostomy output is improved and he continues with good po intake, he might not need one. GI recommended to continue PPI, IVF, Imodium, and started Questran. Since beginning the Questran, his ostomy output has thickened up. Pt was on Paregoric, not sure why it was stopped.  Per GI note, monitoring the pt's ileostomy output over the next few days.  - Continue Questran and Imodium. - Renal diet - F/u w/ GI  5. Leukocytosis: Resolved. WBC 13 on admission, 9.2 at last check. Hx of UTI during last admission but pt only received 4 doses of 14 of Levaquin therapy b/c he couldn't afford the medication after discharge. Patient specifically denies dysuria, fever, chills. Still has a leukocytosis, may have been from uncleared UTI, stress reaction, AKI. Urine cx grew pan sensitive E. coli at last admission. C. Diff, stool cx, and urine cx negative. Continued Levaquin to treat as complicated UTI in a male, but it was d/c'd on day 3 as Ucx neg.  Lab 04/15/12 0905 04/14/12 0610 04/13/12 1332    WBC 9.2 11.4* 13.0*    6. DM-II: Last A1c was 6.2 at 03/02/12. Patient has been on Metformin and Novolin 70/30 at home. Holding Metformin as pt with metabolic acidosis and renal failure.  Placed on Lantus 10u with SSI. Lantus held 2x and CBGs stable, so stopped Lantus. Will continue SSI, but pt has not required any insulin.  - Continue SSI  Lab 04/21/12 0826 04/20/12 2108 04/20/12 1640 04/20/12 1616 04/20/12 1130  GLUCAP 143* 110* 96 57* 122*    7. HTN: Hypotensive on admission with blood pressure of 92/59. Improving with IVF and inceased po intake. Today 130/50.  8. PPX: [place] Heparin  9. Dispo: Waiting to hear from SW re: placement. He is doing well off the IVF and his ostomy output has slowed down. He might be able to go home with home health. PT still recommending home health PT/24hr supervision.     LOS: 8 days   [name] 04/21/2012, 11:47 AM

## 2012-04-21 NOTE — Progress Notes (Signed)
Referred to CSWfor ?SNF. Chart reviewed and have spoken with patient and RNCM and Care Coordinator who indicate patient plans to d/c home with [facility] and DME. CSW to sign off- please contact us if SW needs arise. Eduard Clos, MSW, [place] [phone]/unit [phone] coverage

## 2012-04-21 NOTE — Progress Notes (Signed)
Utilization review completed.  

## 2012-04-22 LAB — RENAL FUNCTION PANEL
Albumin: 2.2 g/dL — ABNORMAL LOW (ref 3.5–5.2)
BUN: 21 mg/dL (ref 6–23)
CO2: 17 mEq/L — ABNORMAL LOW (ref 19–32)
Calcium: 7.8 mg/dL — ABNORMAL LOW (ref 8.4–10.5)
Chloride: 113 mEq/L — ABNORMAL HIGH (ref 96–112)
Creatinine, Ser: 2.34 mg/dL — ABNORMAL HIGH (ref 0.50–1.35)
GFR calc Af Amer: 33 mL/min — ABNORMAL LOW (ref >90)
GFR calc non Af Amer: 29 mL/min — ABNORMAL LOW (ref >90)
Glucose, Bld: 84 mg/dL (ref 70–99)
Phosphorus: 4.1 mg/dL (ref 2.3–4.6)
Potassium: 4.2 mEq/L (ref 3.5–5.1)
Sodium: 137 mEq/L (ref 135–145)

## 2012-04-22 LAB — GLUCOSE, CAPILLARY
Glucose-Capillary: 104 mg/dL — ABNORMAL HIGH (ref 70–99)
Glucose-Capillary: 125 mg/dL — ABNORMAL HIGH (ref 70–99)

## 2012-04-22 MED ORDER — CHOLESTYRAMINE LIGHT 4 G PO PACK: 4.0000 g | PACK | Freq: Two times a day (BID) | ORAL | Status: DC

## 2012-04-22 MED ORDER — BOOST / RESOURCE BREEZE PO LIQD: 1.0000 | Freq: Three times a day (TID) | ORAL | Status: DC

## 2012-04-22 MED ORDER — PANTOPRAZOLE SODIUM 40 MG PO TBEC: 40.0000 mg | DELAYED_RELEASE_TABLET | Freq: Every day | ORAL | Status: DC

## 2012-04-22 NOTE — Progress Notes (Signed)
Discussed discharge instructions with pt. Pt showed no barriers to discharge. IV removed. Abd dressing changed. Pt no longer uses [facility], scripts were obtained and given to pt. Assessment unchanged from morning.

## 2012-04-22 NOTE — Progress Notes (Addendum)
Subjective: Pt doing well. No complaints. He is ready to go home. Denies N/V/abdominal pain/dizziness.   Objective: Vital signs in last 24 hours: Filed Vitals:   04/21/12 1400 04/21/12 1800 04/21/12 2203 04/22/12 0545  BP: 126/62 125/71 127/60 120/74  Pulse: 80 76 66 72  Temp: 98 F (36.7 C) 98.4 F (36.9 C) 98.1 F (36.7 C) 98.8 F (37.1 C)  TempSrc: Oral Oral Oral Oral  Resp: 18 18 18 17   Height:      Weight:      SpO2: 96% 95% 100% 100%   Weight change:   Intake/Output Summary (Last 24 hours) at 04/22/12 0815 Last data filed at 04/22/12 0545  Gross per 24 hour  Intake   1440 ml  Output   2875 ml  Net  -1435 ml   Vitals reviewed. General: Sitting in chair TV, NAD HEENT: PERRL, EOMI, no scleral icterus Cardiac: RRR, no rubs, murmurs or gallops Pulm: Clear to auscultation bilaterally, no wheezes, rales, or rhonchi Abd: Soft, nontender, nondistended, BS present, abd wound with bandage, thicker stool in ostomy bag Ext: Warm and well perfused, no pedal edema Neuro: Alert and oriented X3, cranial nerves II-XII grossly intact  Lab Results: Basic Metabolic Panel:  Lab [id number] 0605 04/21/12 0925  NA 137 139  K 4.2 4.2  CL 113* 113*  CO2 17* 16*  GLUCOSE 84 109*  BUN 21 24*  CREATININE 2.34* 2.41*  CALCIUM 7.8* 8.0*  MG -- --  PHOS 4.1 3.6   Liver Function Tests:  Lab 04/22/12 0605 04/21/12 0925  AST -- --  ALT -- --  ALKPHOS -- --  BILITOT -- --  PROT -- --  ALBUMIN 2.2* 2.5*   CBC:  Lab 04/15/12 0905  WBC 9.2  NEUTROABS --  HGB 9.0*  HCT 25.9*  MCV 69.4*  PLT 443*   CBG:  Lab 04/21/12 2150 04/21/12 1616 04/21/12 1138 04/21/12 0826 04/20/12 2108 04/20/12 1640  GLUCAP 96 118* 124* 143* 110* 96    Micro Results: Recent Results (from the past 240 hour(s))  URINE CULTURE     Status: Normal   Collection Time   04/13/12  3:47 PM      Component Value Range Status Comment   Specimen Description URINE, CLEAN CATCH   Final    Special Requests NONE    Final    Culture  Setup Time 04/13/2012 16:47   Final    Colony Count NO GROWTH   Final    Culture NO GROWTH   Final    Report Status 04/14/2012 FINAL   Final   CLOSTRIDIUM DIFFICILE BY PCR     Status: Normal   Collection Time   04/13/12  4:09 PM      Component Value Range Status Comment   C difficile by pcr NEGATIVE  NEGATIVE Final   STOOL CULTURE     Status: Normal   Collection Time   04/14/12  9:18 AM      Component Value Range Status Comment   Specimen Description STOOL   Final    Special Requests Normal   Final    Culture     Final    Value: NO SALMONELLA, SHIGELLA, CAMPYLOBACTER, YERSINIA, OR E.COLI 0157:H7 ISOLATED     Note: REDUCED NORMAL FLORA PRESENT   Report Status 04/18/2012 FINAL   Final    Studies/Results: No results found. Medications: I have reviewed the patient's current medications. Scheduled Meds:    . atorvastatin  20 mg Oral QHS  .  cholestyramine light  4 g Oral Custom  . feeding supplement  1 Container Oral TID BM  . heparin  5,000 Units Subcutaneous Q8H  . insulin aspart  0-9 Units Subcutaneous TID WC  . loperamide  2 mg Oral QID  . pantoprazole  40 mg Oral Q0600  . sodium chloride  3 mL Intravenous Q12H   Continuous Infusions:   PRN Meds:.acetaminophen, acetaminophen, ondansetron (ZOFRAN) IV  Assessment/Plan: [name] is a 61 yo gentleman with a history of type 2 diabetes on insulin, invasive adenocarcinoma of colon s/p right hemicolectomy complicated by postop intra-abdominal abscess with a leaking anastamosis, OSA, hypertension, who presents with nausea, vomiting, and dehydration resulting in reoccurrence of acute renal failure.   1. Acute renal failure: Recurrent. On admission, creatinine was 11.2 and BUN 84. Likely secondary to extreme volume depletion in the setting of nausea, vomiting, increased ileostomy output. In his previous admission, his BUN was 156 and creatinine 17. Bicarbonate was 16 and potassium 6.5. It was likely related to his  IV contrast used for abdominal CT at last admission. He was treated with IV fluids and left the hospital on 04/05/12 with a creatinine of 3.6. Of note, the patient's creatinine was 1.0 on 03/09/12. On this admission, the patient was a severely volume depleted and acidotic with an anion gap of 18.  Metabolic acidosis likely due to renal failure secondary to dehydration. Renal U/S showed no obstruction, bilateral renal cysts noted. Renal was consulted, Dr. Hassell [name] evaluated the patient and suggested treating with volume resuscitation and alkalai therapy. Per Renal recs, he was started on 1/2 NS with 75 mEq bicarbonate at 250 cc an hour x 8 hours then 120 cc per hour. His AG on HD #2 was still elevated at 17, and we continued the fluids per Renal. His AG closed on 11/14. FeNa <<< 1.0. No HD has been required. Foley removed 5 days ago, UOP good. IVF were d/c'd 3 days ago and renal function continues to improve with Cr 2.34 and GFR 33 today.   - Strict I and O's, daily weights  - Follow BMP   Lab 04/22/12 0605 04/21/12 0925 04/20/12 0434 04/19/12 0430 04/18/12 0440  CREATININE 2.34* 2.41* 2.84* 3.38* 4.65*   2. Hyperkalemia: Resolved; potassium stable. K was 5.4 on admission without EKG changes. Patient received 1 dose of Kayexalate, 15 g in ED. On repeat BMP, potassium 4.9, 4.2 this morning. Will continue to monitor. - am BMP   Lab 04/22/12 0605 04/21/12 0925 04/20/12 0434 04/19/12 0430 04/18/12 0440  K 4.2 4.2 4.2 4.3 4.4    3. 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 S/p prior wound vac in place which was removed on 03/10/12. His wound was healing nicely at previous d/c, and continues to heal well.  At this admission, his ileostomy site appears clear, with liquid output. He  previous CT scan abd/pelvis showed inflammation of omental fat, no focal abscess noted, no SBO. Pt with abdominal pain to deep palpation around the incision, no rebound or guarding. Does not appear septic. Dr. Georgette [name] saw the pt in the hospital and felt the wound was healing well. - Continue Ileostomy care   4. Nausea, vomiting, and diarrhea: Resolved. Patient has been on Levaquin recently, which put him at risk of getting C. difficile colitis, but Cdiff PCR negative.  Patient had elevated lipase in the past,143 on 03/29/12. Lipase 55 on admission. Tolerating diet, with improved po intake. Will consider PICC for IVF for d/c. Dr. Georgette [name] saw the pt and recommended GI consult for the watery output from his ostomy, but if his ostomy output is improved and he continues with good po intake, he might not need one. GI recommended to continue PPI, IVF, Imodium, and started Questran. Since beginning the Questran, his ostomy output has thickened up. Pt was on Paregoric, not sure why it was stopped.  Per GI note from 11/17, monitoring the pt's ileostomy output, which has dramatically reduced. He has been able to maintain adequate po intake, and his renal fx continues to improve.  - Continue Questran and Imodium. - Renal diet  5. Leukocytosis: Resolved. WBC 13 on admission, 9.2 at last check. Hx of UTI during last admission but pt only received 4 doses of 14 of Levaquin therapy b/c he couldn't afford the medication after discharge. Patient specifically denies dysuria, fever, chills. Still has a leukocytosis, may have been from uncleared UTI, stress reaction, AKI. Urine cx grew pan sensitive E. coli at last admission. C. Diff, stool cx, and urine cx negative. Continued Levaquin to treat as complicated UTI in a male, but it was d/c'd on day 3 as Ucx neg.  Lab 04/15/12 0905 04/14/12 0610 04/13/12 1332  WBC 9.2 11.4* 13.0*    6. DM-II: Last A1c was 6.2 at 03/02/12. Patient has been on Metformin and Novolin 70/30 at home.  Holding Metformin as pt with metabolic acidosis and renal failure.  Placed on Lantus 10u with SSI. Lantus held 2x and CBGs stable, so stopped Lantus. Will continue SSI, but pt has not required any insulin.  - Continue SSI  Lab 04/21/12 2150 04/21/12 1616 04/21/12 1138 04/21/12 0826 04/20/12 2108  GLUCAP 96 118* 124* 143* 110*    7. HTN: Hypotensive on admission with blood pressure of 92/59. Improving with IVF and inceased po intake. Today 120/74.  8. PPX: [place] Heparin  9. Dispo: D/c home with with home health, likely today. PT still recommending home health PT/24hr supervision.     LOS: 9 days   Otho Bellows 04/22/2012, 8:15 AM

## 2012-04-22 NOTE — Discharge Planning (Deleted)
[facility] Discharge Note  Name: [name] MRN: [id number] DOB: 11-Dec-1950 61 y.o.  Date of Admission: 04/13/2012  1:08 PM Date of Discharge: 04/22/2012 Attending Physician: Madilyn Fireman, MD  Discharge Diagnosis: Principal Problem:  *Short gut syndrome Active Problems:  Dehydration   Discharge Medications:   Medication List     As of 04/22/2012 11:10 AM    STOP taking these medications         insulin NPH-insulin regular (70-30) 100 UNIT/ML injection   Commonly known as: NOVOLIN 70/30      levofloxacin 250 MG tablet   Commonly known as: LEVAQUIN      metFORMIN 1000 MG tablet   Commonly known as: GLUCOPHAGE      valsartan 80 MG tablet   Commonly known as: DIOVAN      TAKE these medications         atorvastatin 20 MG tablet   Commonly known as: LIPITOR   Take 20 mg by mouth at bedtime.      cholestyramine light 4 G packet   Commonly known as: PREVALITE   Take 1 packet (4 g total) by mouth 2 (two) times daily.      feeding supplement Liqd   Take 1 Container by mouth 3 (three) times daily between meals.      loperamide 2 MG capsule   Commonly known as: IMODIUM   Take 1 capsule (2 mg total) by mouth 4 (four) times daily as needed for diarrhea or loose stools (for very loose ileostomy output; output should be thicker (pancake batter consistency) not watery).      pantoprazole 40 MG tablet   Commonly known as: PROTONIX   Take 1 tablet (40 mg total) by mouth daily at 6 (six) AM.         Disposition and follow-up:   Mr.Deanthony A Achenbach was discharged from [facility] in Good condition.  At the hospital follow up visit please address his renal function by checking a BMP and make sure he is taking in enough fluids to stay hydrated- also check his ostomy output to make sure he is not dumping fluid from his ostomy. Please check his blood glucose levels- all diabetes medication was stopped in the hospital, as  the pt has not required any insulin and was hypoglycemic on the Lantus. Please check his blood pressures- he will not be discharged on any antihypertensives, as his pressures have been stable.  Follow-up Appointments: Follow-up Information    Follow up with Maia Petties., MD. On 05/11/2012. (Appointment is at 1:20pm)    Contact information:   [place] [phone]       Follow up with Criss Alvine, MD. On 04/27/2012. (Appointment is at 2:15pm)    Contact information:   [address] ground floor [place] [address] [phone]         Discharge Orders    Future Appointments: Provider: Department: Dept Phone: Center:   04/27/2012 2:15 PM Hadassah Pais, MD [place] [phone] [facility]   05/11/2012 1:40 PM Imogene Burn. [name], [facility], [place] [phone] None     Future Orders Please Complete By Expires   Diet general      Comments:   Renal diet, per Nutrition recommendations. Continue drinking the Breeze and Boost drinks to help increase your nutrition.   Discharge instructions      Comments:   If you begin to  have nausea, vomiting, increased watery output from your ostomy, or decreased urine output, please contact the clinic or go to the ED.   Walk with assistance      Change dressing (specify)      Comments:   Continue wet to dry dressing changes daily and as needed until you see Dr. [name]. If you wound begins to become painful or has significant discharge or looks worse, please call Dr. Vonna Kotyk office.   Call MD for:  redness, tenderness, or signs of infection (pain, swelling, redness, odor or green/yellow discharge around incision site)      Call MD for:  persistant nausea and vomiting         Consultations:   Nephrology, Dr. Marval Regal General Surgery, Dr. [name] Gastroenterology, Dr. Henrene Pastor  Procedures Performed:  Dg Chest 2 View  04/13/2012  *RADIOLOGY REPORT*  Clinical Data:  Chest pain  CHEST - 2 VIEW  Comparison: 03/02/2012  Findings: Normal heart size.  Clear lungs.  No pneumothorax.  No pleural effusion.  IMPRESSION: No active cardiopulmonary disease.   Original Report Authenticated By: Marybelle Killings, M.D.    Ct Abdomen Pelvis W Contrast  03/29/2012  *RADIOLOGY REPORT*  Clinical Data: Status post fall.  History of intra-abdominal abscess secondary to the anastomotic leak.  History of colon cancer.  CT ABDOMEN AND PELVIS WITH CONTRAST  Technique:  Multidetector CT imaging of the abdomen and pelvis was performed following the standard protocol during bolus administration of intravenous contrast.  Contrast: 162mL OMNIPAQUE IOHEXOL 300 MG/ML  SOLN  Comparison: Chest and two views abdomen 02/16/2012.  Findings: The lung bases are clear.  There is no pleural or pericardial effusion.  The patient has a midline surgical wound.  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  The patient is status post right hemicolectomy.  There is no abscess.  There is extensive infiltration of omental fat, particularly on the left.  There is no rim enhancing fluid collection although there are some small foci of fluid density material present.  No evidence of small bowel obstruction is present.  The stomach appears normal.  A few small stones are seen layering dependently in the gallbladder but there is no evidence of cholecystitis.  The liver, adrenal glands, spleen and pancreas appear normal.  Low attenuating lesions in both kidneys are most consistent with cysts. There are lymph nodes in the porta hepatis measuring up to 1.8 cm on image 21. Foley catheter is in place urinary bladder with an air-fluid level identified.  No focal bony abnormality is seen with extensive degenerative disease present about the hips.  IMPRESSION:  1.  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 2.  Extensive infiltration of omental fat, worse on the left, could be due to  infectious or inflammatory process or postoperative change.  Omental tumor implants are possible but felt somewhat less likely.  No abscess is identified. 3.  Fatty infiltration of the liver. 4.  Small lymph node in the porta hepatis is nonspecific and likely reactive.  Recommend attention on follow-up exams. 5.  Small gallstones without evidence of cholecystitis.   Original Report Authenticated By: Arvid Right. Luther Parody, M.D.    US Renal  04/13/2012  *RADIOLOGY REPORT*  Clinical Data:  Acute renal failure  RENAL/URINARY TRACT ULTRASOUND COMPLETE  Comparison:  None.  Findings:  Right Kidney:  Right renal cysts noted. The largest is in the upper pole measuring 2.6 x 1.9 x 2.2 cm.  Normal in  size and parenchymal echogenicity.  No evidence of mass or hydronephrosis.  Left Kidney:  Upper pole left renal cyst measures 2.2 x 1.6 x 3.0 cm.  Normal in size and parenchymal echogenicity.  No evidence of mass or hydronephrosis.  Bladder:  Appears normal for degree of bladder distention.  IMPRESSION: 1.  Bilateral renal cyst. 2.  No obstructive uropathy.   Original Report Authenticated By: Kerby Moors, M.D.    US Renal  03/30/2012  *RADIOLOGY REPORT*  Clinical Data: Acute renal failure.  RENAL/URINARY TRACT ULTRASOUND COMPLETE  Comparison:  CT scan dated 05/29/2012  Findings:  Right Kidney:  13.4 cm in length.  Several small cysts in the right kidney.  No hydronephrosis. Slightly echogenic renal parenchyma, equal to the liver.  Left Kidney:  12.6 cm in length.  3.3 cm oblong cyst in the upper pole.  No hydronephrosis.  Bladder:  Almost empty.  IMPRESSION:  1.  No hydronephrosis.  Slight increased echogenicity of the renal parenchyma consistent with renal medical disease. 2.  Small benign-appearing cysts on both kidneys.   Original Report Authenticated By: Larey Seat, M.D.     Admission History of Present Illness:  Mr Gotti is a 61 yo gentleman with a history of colon adenocarcinoma s/p r hemicolectomy  complicated by anastamotic leak with intraabdominal abscess and surgical site infection, recently discharged from the teaching service after being treated for acute renal injury requiring hemodialysis. He presents from home with recurrent symptoms of nausea, vomiting, and high output from his ileostomy for the past 4 days, looks like clear liquid with bits of solids. He states that he has not been able to keep anything down and has become more and more dehydrated. He vomits everything he eats and drinks and has had to change is ostomy bag more than usual. He also states that he hasn't urinated since the day before admission and it was a small amount. He was concerned and presented to the ED.  Of note, he states there was a 3-day delay in filling the rx for levaquin and tramadol from his recent discharge. This was 2/2 finances. His abdominal wound has been doing well, no increase in drainage, continues to "close up."  Review of systems + for an episode of "purple" vision that lasted 3-5 minutes a few days ago.  Denies dysuria, fever, chills, chest pain, shortness of breath.  Review of Systems:  Pertinent items noted in HPI   Physical Exam  Blood pressure 92/59, pulse 81, temperature 97.6 F (36.4 C), temperature source Oral, resp. rate 22, SpO2 97.00%.  General: No acute distress, alert and oriented x 3  HEENT: PERRL, EOMI, drymucous membranes  Cardiovascular: Regular rate and rhythm, no murmurs  Respiratory: Clear to auscultation bilaterally, no wheezes, rales, or rhonchi  Abdomen: Soft, nondistended, nontender, ostomy right LQ, clean midline abdominal wound, fascial sutures visible, some yellow purulence on dressing, pink granulation tissue, non tender, no surrounding erythema  Extremities: Warm and well-perfused, no clubbing, cyanosis, or edema.  Skin: Warm, dry, no rashes  Neuro: Not anxious appearing, no depressed mood, normal affect    Hospital Course by problem list: Mr. Wheelus is a 61  yo gentleman with a history of type 2 diabetes on insulin, invasive adenocarcinoma of colon s/p right hemicolectomy complicated by postop intra-abdominal abscess with anastomotic breakdown and ileostomy placement with Hartman's pouch, OSA, DM, and hypertension, who presents with nausea, vomiting, and dehydration resulting in reoccurrence of acute renal failure.  1. Recurrent acute renal failure: On  admission, creatinine was 11.2 and BUN 84. Likely secondary to extreme volume depletion in the setting of nausea, vomiting, increased ileostomy output. In his previous admission, his BUN was 156 and creatinine 17. Bicarbonate was 16 and potassium 6.5. It was likely related to his IV contrast used for abdominal CT at last admission. He was treated with IV fluids and left the hospital on 04/05/12 with a creatinine of 3.6. Of note, the patient's creatinine was 1.0 on 03/09/12. On this admission, the patient was a severely volume depleted and acidotic with an anion gap of 18. Metabolic acidosis likely due to renal failure secondary to dehydration. Renal U/S showed no obstruction, bilateral renal cysts were noted. Renal was consulted, Dr. Hassell Done evaluated the patient and suggested treating with volume resuscitation and alkalai therapy. Per Renal recs, he was started on 1/2 NS with 75 mEq bicarbonate at 250 cc an hour x 8 hours then 120 cc per hour. His AG on HD #2 was still elevated at 17, and we continued the fluids per Renal. His AG closed on 11/14. FeNa <<< 1.0. He was switched to normal saline, until two days prior to discharge when the IVF were stopped. He continues to have good urine output with improving renal function; no HD has been required.  Cr 2.34 and GFR 33 on the day of discharge. His renal function will need to be monitored as an outpatient.   Lab  04/22/12 0605  04/21/12 0925  04/20/12 0434  04/19/12 0430  04/18/12 0440   CREATININE  2.34*  2.41*  2.84*  3.38*  4.65*    2. Hyperkalemia: Potassium was 5.4 on  admission without EKG changes. Patient received 1 dose of Kayexalate, 15 g in ED. On repeat BMP, potassium 4.9. His potassium has remained stable, and it was 4..2 on the morning of discharge. This will need to be monitored as an outpatient.   Lab  04/22/12 0605  04/21/12 0925  04/20/12 0434  04/19/12 0430  04/18/12 0440   K  4.2  4.2  4.2  4.3  4.4    3. 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 S/p prior wound vac in place which was removed on 03/10/12. His wound was healing nicely at previous d/c, and continues to heal well. At this admission, his ileostomy site and abdominal wound were clean and intact with no purulence. A CT abd/pelvis from previous admission showed inflammation of omental fat, no focal abscess noted, and no SBO. Pt with abdominal pain to deep palpation around the incision on admission, no rebound or guarding. He was not appear septic. Dr. [name] saw the pt in the hospital and felt the wound was healing well. Wound and ostomy care was continued in the hospital, and the pt is doing well.   4. Nausea, vomiting, and diarrhea: Resolved. Patient has been on Levaquin recently, which put him at risk of getting C. difficile colitis, but Cdiff PCR negative. Patient had elevated lipase in the past,143 on 03/29/12. Lipase 55 on admission. Tolerating diet, with improved po intake. Will consider PICC for IVF for d/c. Dr. [name] saw the pt and recommended GI consult for the watery output from his ostomy, but if his ostomy output is improved and he continues with good po intake, he might not need one. GI recommended to continue PPI, IVF,  Imodium, and started Questran. Since beginning the Questran, his ostomy output has thickened up. Pt was on Paregoric, not sure why it was  stopped. Per GI note from 11/17, monitoring the pt's ileostomy output, which has dramatically reduced, and is thicker in consistancey. He has been able to maintain adequate po intake, and his renal fx continues to improve. In speaking with GI, they felt he was ready for discharge with close follow up.  - Continue Questran and Imodium.  - Renal diet   5. Leukocytosis: WBC 13 on admission. Hx of UTI during last admission but pt only received 4 doses of 14 of Levaquin therapy b/c he couldn't afford the medication after discharge. Patient specifically denies dysuria, fever, chills. Leukocytosis may have been from uncleared UTI, stress reaction, or AKI. Urine cx grew pan sensitive E. coli at last admission. On this admission, C. Diff, stool cx, and urine cx negative. Continued Levaquin to treat previously positive urine cultures as a complicated UTI in a male, but it was d/c'd on day 3 as repeat Ucx neg. He has remained asymptomatic. His white count improved to 9.2 during this admission. Lab  04/15/12 0905  04/14/12 0610  04/13/12 1332   WBC  9.2  11.4*  13.0*    6. DM-II: Last A1c was 6.2 at 03/02/12. Patient has been on Metformin and Novolin 70/30 at home. Holding Metformin as pt with metabolic acidosis and renal failure. Placed on Lantus 10u with SSI. Lantus held 2x due to low blood glucose levels. His CBGs remained stable, so the Lantus was stopped. Continued SSI, but pt did not require any insulin. Stopping all insulin and the Metformin at discharge. Her blood glucose levels will need to be monitored as an outpatient.  Lab  04/21/12 2150  04/21/12 1616  04/21/12 1138  04/21/12 0826  04/20/12 2108   GLUCAP  96  118*  124*  143*  110*    7. HTN: He was hypotensive on admission with blood pressure of 92/59. Likely due to hypovolemia. He was on and ARB at home, which was help on admission due to his hypotension and AKI. He was started on IVF. With fluids and increased po intake, his blood pressure improved.  On the day of discharge, his blood pressure was120/74. He is being discharged without any antihypertensives, as his blood pressure has been well controlled in the hospital. This will need to be monitored as an out patent.     Discharge Vitals:  BP 122/70  Pulse 82  Temp 98.1 F (36.7 C) (Oral)  Resp 18  Ht 6' (1.829 m)  Wt 226 lb 6.6 oz (102.7 kg)  BMI 30.71 kg/m2  SpO2 100%  Discharge Labs:  Results for orders placed during the hospital encounter of 04/13/12 (from the past 24 hour(s))  GLUCOSE, CAPILLARY     Status: Abnormal   Collection Time   04/21/12 11:38 AM      Component Value Range   Glucose-Capillary 124 (*) 70 - 99 mg/dL  GLUCOSE, CAPILLARY     Status: Abnormal   Collection Time   04/21/12  4:16 PM      Component Value Range   Glucose-Capillary 118 (*) 70 - 99 mg/dL  GLUCOSE, CAPILLARY     Status: Normal   Collection Time   04/21/12  9:50 PM      Component Value Range   Glucose-Capillary 96  70 - 99 mg/dL  RENAL FUNCTION PANEL     Status: Abnormal   Collection Time  04/22/12  6:05 AM      Component Value Range   Sodium 137  135 - 145 mEq/L   Potassium 4.2  3.5 - 5.1 mEq/L   Chloride 113 (*) 96 - 112 mEq/L   CO2 17 (*) 19 - 32 mEq/L   Glucose, Bld 84  70 - 99 mg/dL   BUN 21  6 - 23 mg/dL   Creatinine, Ser 2.34 (*) 0.50 - 1.35 mg/dL   Calcium 7.8 (*) 8.4 - 10.5 mg/dL   Phosphorus 4.1  2.3 - 4.6 mg/dL   Albumin 2.2 (*) 3.5 - 5.2 g/dL   GFR calc non Af Amer 29 (*) >90 mL/min   GFR calc Af Amer 33 (*) >90 mL/min    Signed: Otho Bellows 04/22/2012, 11:10 AM   Time Spent on Discharge: 49min Services Ordered on Discharge: Home health nursing and social work Sales promotion account executive Ordered on Discharge: None

## 2012-04-27 ENCOUNTER — Ambulatory Visit (INDEPENDENT_AMBULATORY_CARE_PROVIDER_SITE_OTHER): Payer: Self-pay | Admitting: Internal Medicine

## 2012-04-27 ENCOUNTER — Encounter: Payer: Self-pay | Admitting: Internal Medicine

## 2012-04-27 VITALS — BP 129/77 | HR 75 | Temp 97.0°F | Wt 212.9 lb

## 2012-04-27 DIAGNOSIS — K912 Postsurgical malabsorption, not elsewhere classified: Secondary | ICD-10-CM

## 2012-04-27 DIAGNOSIS — I10 Essential (primary) hypertension: Secondary | ICD-10-CM

## 2012-04-27 DIAGNOSIS — E119 Type 2 diabetes mellitus without complications: Secondary | ICD-10-CM

## 2012-04-27 DIAGNOSIS — N179 Acute kidney failure, unspecified: Secondary | ICD-10-CM

## 2012-04-27 LAB — GLUCOSE, CAPILLARY: Glucose-Capillary: 70 mg/dL (ref 70–99)

## 2012-04-27 NOTE — Assessment & Plan Note (Signed)
Patient recovering from right hemi- colectomy. Followup with surgeon on 05/11/2012.

## 2012-04-27 NOTE — Assessment & Plan Note (Signed)
Lab Results  Component Value Date   NA 137 04/22/2012   K 4.2 04/22/2012   CL 113* 04/22/2012   CO2 17* 04/22/2012   BUN 21 04/22/2012   CREATININE 2.34* 04/22/2012   CREATININE 1.12 07/04/2011    BP Readings from Last 3 Encounters:  04/27/12 129/77  04/22/12 122/70  04/06/12 121/74    Assessment: Hypertension control:  controlled  Progress toward goals:  at goal Barriers to meeting goals:  no barriers identified  Plan: Hypertension treatment:  All antihypertensive stopped at hospital discharge. Discussed with him about no need of antihypertensive for now. He verbalized understanding.

## 2012-04-27 NOTE — Assessment & Plan Note (Signed)
Diabetic medications were stopped recently during hospital discharge. CBG today 70. Was given some juice while in clinic. No need for repeat testing as it was 70. Advised him to stay away from his diabetes medications and he verbalized understanding.

## 2012-04-27 NOTE — Assessment & Plan Note (Signed)
Patient with recurrent acute renal failure recently post surgery. Last creatinine 2.34 while in hospital. Repeat BMP today. Patient says he is hydrating himself well.

## 2012-04-27 NOTE — Patient Instructions (Signed)
Please make followup appointment with PCP in December if available. If not, then with [facility] resident in December and next available with PCP.  Your blood pressure is under good control without any medications. Also your diabetes is under good control without medications.  Continue taking all medications as you are doing. Followup with surgeon.  Call us earlier if needed.

## 2012-04-27 NOTE — Progress Notes (Signed)
  Subjective:    Patient ID: [name], male    DOB: [birth year], 61 y.o.   MRN: [id number]  HPI patient is a pleasant 61 year old man with recent adenocarcinoma of colon status post right hemicolectomy and colostomy and postop complications with abscess who comes to the clinic for hospital followup visit.   He was admitted in the hospital for postop complications in terms of abscess after his hemi-colectomy. He was appropriately treated and discharged home. At the time of discharge his blood pressure medications and diabetes medications were held as his blood pressure was under good control without being on medications and also he was hypoglycemic on insulin. Today his blood pressure is 129/77 and his CBG is 70. Encouraged him to hold off the blood pressure and diabetes medications. He verbalized understanding.  He denies any fever, chills, nausea vomiting, chest pain, short of breath. He denies excess output from ostomy bag. Also does not have any Pain at ostomy site. Has a followup appointment with surgeon in early part of December.  He says he is hydrating himself well.    Review of Systems    as per history of present illness. Objective:   Physical Exam  General: NAD HEENT: PERRL, EOMI, no scleral icterus Cardiac: S1, S2, RRR, no rubs, murmurs or gallops Pulm: clear to auscultation bilaterally, moving normal volumes of air Abd: soft, nontender, nondistended. Colostomy bag with some collection. No pain or tenderness Ext: warm and well perfused, no pedal edema Neuro: alert and oriented X3, cranial nerves II-XII grossly intact       Assessment & Plan:

## 2012-04-28 LAB — BASIC METABOLIC PANEL WITH GFR
BUN: 23 mg/dL (ref 6–23)
CO2: 17 mEq/L — ABNORMAL LOW (ref 19–32)
Calcium: 8.3 mg/dL — ABNORMAL LOW (ref 8.4–10.5)
Chloride: 114 mEq/L — ABNORMAL HIGH (ref 96–112)
Creat: 1.83 mg/dL — ABNORMAL HIGH (ref 0.50–1.35)
GFR, Est African American: 45 mL/min — ABNORMAL LOW
GFR, Est Non African American: 39 mL/min — ABNORMAL LOW
Glucose, Bld: 89 mg/dL (ref 70–99)
Potassium: 5.1 mEq/L (ref 3.5–5.3)
Sodium: 136 mEq/L (ref 135–145)

## 2012-05-08 NOTE — Discharge Summary (Signed)
Internal [facility] Discharge Note  Name: [name] MRN: [id number] DOB: 08/05/1950 61 y.o.  Date of Admission: 04/13/2012  1:08 PM Date of Discharge: 04/22/2012 Attending Physician: Madilyn Fireman, MD    Discharge Diagnosis: Principal Problem:  *Short gut syndrome Active Problems:  Dehydration   Discharge Medications:   Medication List     As of 04/22/2012 11:10 AM    STOP taking these medications         insulin NPH-insulin regular (70-30) 100 UNIT/ML injection   Commonly known as: NOVOLIN 70/30      levofloxacin 250 MG tablet   Commonly known as: LEVAQUIN      metFORMIN 1000 MG tablet   Commonly known as: GLUCOPHAGE      valsartan 80 MG tablet   Commonly known as: DIOVAN      TAKE these medications         atorvastatin 20 MG tablet   Commonly known as: LIPITOR   Take 20 mg by mouth at bedtime.      cholestyramine light 4 G packet   Commonly known as: PREVALITE   Take 1 packet (4 g total) by mouth 2 (two) times daily.      feeding supplement Liqd   Take 1 Container by mouth 3 (three) times daily between meals.      loperamide 2 MG capsule   Commonly known as: IMODIUM   Take 1 capsule (2 mg total) by mouth 4 (four) times daily as needed for diarrhea or loose stools (for very loose ileostomy output; output should be thicker (pancake batter consistency) not watery).      pantoprazole 40 MG tablet   Commonly known as: PROTONIX   Take 1 tablet (40 mg total) by mouth daily at 6 (six) AM.         Disposition and follow-up:   [name] was discharged from [facility] in Good condition.  At the hospital follow up visit please address his renal function by checking a BMP and make sure he is taking in enough fluids to stay hydrated- also check his ostomy output to make sure he is not dumping fluid from his ostomy. Please check his blood glucose levels- all diabetes medication was stopped in the hospital,  as the pt has not required any insulin and was hypoglycemic on the Lantus. Please check his blood pressures- he will not be discharged on any antihypertensives, as his pressures have been stable.  Follow-up Appointments: Follow-up Information    Follow up with Maia Petties., MD. On 05/11/2012. (Appointment is at 1:20pm)    Contact information:   [place] [id number] [phone]       Follow up with Criss Alvine, MD. On 04/27/2012. (Appointment is at 2:15pm)    Contact information:   [address], ground floor [place] [address] [phone]         Discharge Orders    Future Appointments: Provider: Department: Dept Phone: Center:   05/11/2012 1:40 PM Imogene Burn. [name], [facility], [place] [phone] None   05/18/2012 10:45 AM Annamarie Dawley, DO [facility] [phone] [facility]   06/17/2012 1:15 PM [name], MD [facility] [phone] [facility]     Future Orders Please Complete By Expires   Diet general      Comments:   Renal diet, per Nutrition recommendations. Continue drinking the Anderson and Boost drinks to help increase your  nutrition.   Discharge instructions      Comments:   If you begin to have nausea, vomiting, increased watery output from your ostomy, or decreased urine output, please contact the clinic or go to the ED.   Walk with assistance      Change dressing (specify)      Comments:   Continue wet to dry dressing changes daily and as needed until you see Dr. [name]. If you wound begins to become painful or has significant discharge or looks worse, please call Dr. Vonna Kotyk office.   Call MD for:  redness, tenderness, or signs of infection (pain, swelling, redness, odor or green/yellow discharge around incision site)      Call MD for:  persistant nausea and vomiting         Consultations:   Nephrology, Dr. Marval Regal General Surgery, Dr.  [name] Gastroenterology, Dr. Henrene Pastor  Procedures Performed:  Dg Chest 2 View  04/13/2012  *RADIOLOGY REPORT*  Clinical Data: Chest pain  CHEST - 2 VIEW  Comparison: 03/02/2012  Findings: Normal heart size.  Clear lungs.  No pneumothorax.  No pleural effusion.  IMPRESSION: No active cardiopulmonary disease.   Original Report Authenticated By: Marybelle Killings, M.D.    Ct Abdomen Pelvis W Contrast  03/29/2012  *RADIOLOGY REPORT*  Clinical Data: Status post fall.  History of intra-abdominal abscess secondary to the anastomotic leak.  History of colon cancer.  CT ABDOMEN AND PELVIS WITH CONTRAST  Technique:  Multidetector CT imaging of the abdomen and pelvis was performed following the standard protocol during bolus administration of intravenous contrast.  Contrast: 145mL OMNIPAQUE IOHEXOL 300 MG/ML  SOLN  Comparison: Chest and two views abdomen 02/16/2012.  Findings: The lung bases are clear.  There is no pleural or pericardial effusion.  The patient has a midline surgical wound.  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  The patient is status post right hemicolectomy.  There is no abscess.  There is extensive infiltration of omental fat, particularly on the left.  There is no rim enhancing fluid collection although there are some small foci of fluid density material present.  No evidence of small bowel obstruction is present.  The stomach appears normal.  A few small stones are seen layering dependently in the gallbladder but there is no evidence of cholecystitis.  The liver, adrenal glands, spleen and pancreas appear normal.  Low attenuating lesions in both kidneys are most consistent with cysts. There are lymph nodes in the porta hepatis measuring up to 1.8 cm on image 21. Foley catheter is in place urinary bladder with an air-fluid level identified.  No focal bony abnormality is seen with extensive degenerative disease present about the hips.  IMPRESSION:  1.  Status post right hemicolectomy with a right  lower quadrant ileostomy and long Hartmann's pouch. 2.  Extensive infiltration of omental fat, worse on the left, could be due to infectious or inflammatory process or postoperative change.  Omental tumor implants are possible but felt somewhat less likely.  No abscess is identified. 3.  Fatty infiltration of the liver. 4.  Small lymph node in the porta hepatis is nonspecific and likely reactive.  Recommend attention on follow-up exams. 5.  Small gallstones without evidence of cholecystitis.   Original Report Authenticated By: Arvid Right. Luther Parody, M.D.    US Renal  04/13/2012  *RADIOLOGY REPORT*  Clinical Data:  Acute renal failure  RENAL/URINARY TRACT ULTRASOUND COMPLETE  Comparison:  None.  Findings:  Right Kidney:  Right renal cysts noted.  The largest is in the upper pole measuring 2.6 x 1.9 x 2.2 cm.  Normal in size and parenchymal echogenicity.  No evidence of mass or hydronephrosis.  Left Kidney:  Upper pole left renal cyst measures 2.2 x 1.6 x 3.0 cm.  Normal in size and parenchymal echogenicity.  No evidence of mass or hydronephrosis.  Bladder:  Appears normal for degree of bladder distention.  IMPRESSION: 1.  Bilateral renal cyst. 2.  No obstructive uropathy.   Original Report Authenticated By: Kerby Moors, M.D.    US Renal  03/30/2012  *RADIOLOGY REPORT*  Clinical Data: Acute renal failure.  RENAL/URINARY TRACT ULTRASOUND COMPLETE  Comparison:  CT scan dated 05/29/2012  Findings:  Right Kidney:  13.4 cm in length.  Several small cysts in the right kidney.  No hydronephrosis. Slightly echogenic renal parenchyma, equal to the liver.  Left Kidney:  12.6 cm in length.  3.3 cm oblong cyst in the upper pole.  No hydronephrosis.  Bladder:  Almost empty.  IMPRESSION:  1.  No hydronephrosis.  Slight increased echogenicity of the renal parenchyma consistent with renal medical disease. 2.  Small benign-appearing cysts on both kidneys.   Original Report Authenticated By: Larey Seat, M.D.      Admission History of Present Illness:  Mr Arshaan is a 61 yo gentleman with a history of colon adenocarcinoma s/p r hemicolectomy complicated by anastamotic leak with intraabdominal abscess and surgical site infection, recently discharged from the teaching service after being treated for acute renal injury requiring hemodialysis. He presents from home with recurrent symptoms of nausea, vomiting, and high output from his ileostomy for the past 4 days, looks like clear liquid with bits of solids. He states that he has not been able to keep anything down and has become more and more dehydrated. He vomits everything he eats and drinks and has had to change is ostomy bag more than usual. He also states that he hasn't urinated since the day before admission and it was a small amount. He was concerned and presented to the ED.  Of note, he states there was a 3-day delay in filling the rx for levaquin and tramadol from his recent discharge. This was 2/2 finances. His abdominal wound has been doing well, no increase in drainage, continues to "close up."  Review of systems + for an episode of "purple" vision that lasted 3-5 minutes a few days ago.  Denies dysuria, fever, chills, chest pain, shortness of breath.  Review of Systems:  Pertinent items noted in HPI   Physical Exam  Blood pressure 92/59, pulse 81, temperature 97.6 F (36.4 C), temperature source Oral, resp. rate 22, SpO2 97.00%.  General: No acute distress, alert and oriented x 3  HEENT: PERRL, EOMI, drymucous membranes  Cardiovascular: Regular rate and rhythm, no murmurs  Respiratory: Clear to auscultation bilaterally, no wheezes, rales, or rhonchi  Abdomen: Soft, nondistended, nontender, ostomy right LQ, clean midline abdominal wound, fascial sutures visible, some yellow purulence on dressing, pink granulation tissue, non tender, no surrounding erythema  Extremities: Warm and well-perfused, no clubbing, cyanosis, or edema.  Skin: Warm,  dry, no rashes  Neuro: Not anxious appearing, no depressed mood, normal affect    Hospital Course by problem list: Mr. Bonee is a 61 yo gentleman with a history of type 2 diabetes on insulin, invasive adenocarcinoma of colon s/p right hemicolectomy complicated by postop intra-abdominal abscess with anastomotic breakdown and ileostomy placement with Hartman's pouch, OSA, DM, and hypertension, who presents with nausea,  vomiting, and dehydration resulting in reoccurrence of acute renal failure.  1. Recurrent acute renal failure: On admission, creatinine was 11.2 and BUN 84. Likely secondary to extreme volume depletion in the setting of nausea, vomiting, increased ileostomy output. In his previous admission, his BUN was 156 and creatinine 17. Bicarbonate was 16 and potassium 6.5. It was likely related to his IV contrast used for abdominal CT at last admission. He was treated with IV fluids and left the hospital on 04/05/12 with a creatinine of 3.6. Of note, the patient's creatinine was 1.0 on 03/09/12. On this admission, the patient was a severely volume depleted and acidotic with an anion gap of 18. Metabolic acidosis likely due to renal failure secondary to dehydration. Renal U/S showed no obstruction, bilateral renal cysts were noted. Renal was consulted, Dr. Hassell Done evaluated the patient and suggested treating with volume resuscitation and alkalai therapy. Per Renal recs, he was started on 1/2 NS with 75 mEq bicarbonate at 250 cc an hour x 8 hours then 120 cc per hour. His AG on HD #2 was still elevated at 17, and we continued the fluids per Renal. His AG closed on 11/14. FeNa <<< 1.0. He was switched to normal saline, until two days prior to discharge when the IVF were stopped. He continues to have good urine output with improving renal function; no HD has been required.  Cr 2.34 and GFR 33 on the day of discharge. His renal function will need to be monitored as an outpatient.   Lab  04/22/12 0605  04/21/12  0925  04/20/12 0434  04/19/12 0430  04/18/12 0440   CREATININE  2.34*  2.41*  2.84*  3.38*  4.65*    2. Hyperkalemia: Potassium was 5.4 on admission without EKG changes. Patient received 1 dose of Kayexalate, 15 g in ED. On repeat BMP, potassium 4.9. His potassium has remained stable, and it was 4..2 on the morning of discharge. This will need to be monitored as an outpatient.   Lab  04/22/12 0605  04/21/12 0925  04/20/12 0434  04/19/12 0430  04/18/12 0440   K  4.2  4.2  4.2  4.3  4.4    3. 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 S/p prior wound vac in place which was removed on 03/10/12. His wound was healing nicely at previous d/c, and continues to heal well. At this admission, his ileostomy site and abdominal wound were clean and intact with no purulence. A CT abd/pelvis from previous admission showed inflammation of omental fat, no focal abscess noted, and no SBO. Pt with abdominal pain to deep palpation around the incision on admission, no rebound or guarding. He was not appear septic. Dr. [name] saw the pt in the hospital and felt the wound was healing well. Wound and ostomy care was continued in the hospital, and the pt is doing well.   4. Nausea, vomiting, and diarrhea: Resolved. Patient has been on Levaquin recently, which put him at risk of getting C. difficile colitis, but Cdiff PCR negative. Patient had elevated lipase in the past,143 on 03/29/12. Lipase 55 on admission. Tolerating diet, with improved po intake. Will consider PICC for IVF for d/c. Dr. [name] saw the pt and recommended GI consult for the watery output from his ostomy, but if his ostomy output is improved and  he continues with good po intake, he might not need one. GI recommended to continue PPI,  IVF, Imodium, and started Questran. Since beginning the Questran, his ostomy output has thickened up. Pt was on Paregoric, not sure why it was stopped. Per GI note from 11/17, monitoring the pt's ileostomy output, which has dramatically reduced, and is thicker in consistancey. He has been able to maintain adequate po intake, and his renal fx continues to improve. In speaking with GI, they felt he was ready for discharge with close follow up.  - Continue Questran and Imodium.  - Renal diet   5. Leukocytosis: WBC 13 on admission. Hx of UTI during last admission but pt only received 4 doses of 14 of Levaquin therapy b/c he couldn't afford the medication after discharge. Patient specifically denies dysuria, fever, chills. Leukocytosis may have been from uncleared UTI, stress reaction, or AKI. Urine cx grew pan sensitive E. coli at last admission. On this admission, C. Diff, stool cx, and urine cx negative. Continued Levaquin to treat previously positive urine cultures as a complicated UTI in a male, but it was d/c'd on day 3 as repeat Ucx neg. He has remained asymptomatic. His white count improved to 9.2 during this admission. Lab  04/15/12 0905  04/14/12 0610  04/13/12 1332   WBC  9.2  11.4*  13.0*    6. DM-II: Last A1c was 6.2 at 03/02/12. Patient has been on Metformin and Novolin 70/30 at home. Holding Metformin as pt with metabolic acidosis and renal failure. Placed on Lantus 10u with SSI. Lantus held 2x due to low blood glucose levels. His CBGs remained stable, so the Lantus was stopped. Continued SSI, but pt did not require any insulin. Stopping all insulin and the Metformin at discharge. Her blood glucose levels will need to be monitored as an outpatient.  Lab  04/21/12 2150  04/21/12 1616  04/21/12 1138  04/21/12 0826  04/20/12 2108   GLUCAP  96  118*  124*  143*  110*    7. HTN: He was hypotensive on admission with blood pressure of 92/59. Likely due to hypovolemia. He was on and ARB at home, which  was help on admission due to his hypotension and AKI. He was started on IVF. With fluids and increased po intake, his blood pressure improved. On the day of discharge, his blood pressure was120/74. He is being discharged without any antihypertensives, as his blood pressure has been well controlled in the hospital. This will need to be monitored as an out patent.     Discharge Vitals:  BP 122/70  Pulse 82  Temp 98.1 F (36.7 C) (Oral)  Resp 18  Ht 6' (1.829 m)  Wt 226 lb 6.6 oz (102.7 kg)  BMI 30.71 kg/m2  SpO2 100%  Discharge Labs:  Results for orders placed during the hospital encounter of 04/13/12 (from the past 24 hour(s))   GLUCOSE, CAPILLARY Status: Abnormal    Collection Time    04/21/12 11:38 AM   Component  Value  Range    Glucose-Capillary  124 (*)  70 - 99 mg/dL   GLUCOSE, CAPILLARY Status: Abnormal    Collection Time    04/21/12 4:16 PM   Component  Value  Range    Glucose-Capillary  118 (*)  70 - 99 mg/dL   GLUCOSE, CAPILLARY Status: Normal    Collection Time    04/21/12 9:50 PM   Component  Value  Range    Glucose-Capillary  96  70 -  99 mg/dL   RENAL FUNCTION PANEL Status: Abnormal    Collection Time    04/22/12 6:05 AM   Component  Value  Range    Sodium  137  135 - 145 mEq/L    Potassium  4.2  3.5 - 5.1 mEq/L    Chloride  113 (*)  96 - 112 mEq/L    CO2  17 (*)  19 - 32 mEq/L    Glucose, Bld  84  70 - 99 mg/dL    BUN  21  6 - 23 mg/dL    Creatinine, Ser  2.34 (*)  0.50 - 1.35 mg/dL    Calcium  7.8 (*)  8.4 - 10.5 mg/dL    Phosphorus  4.1  2.3 - 4.6 mg/dL    Albumin  2.2 (*)  3.5 - 5.2 g/dL    GFR calc non Af Amer  29 (*)  >90 mL/min    GFR calc Af Amer  33 (*)  >90 mL/min     Signed: Otho Bellows 04/22/2012, 11:35 AM   Time Spent on Discharge: 27min Services Ordered on Discharge: Home health nursing and social work Equipment Ordered on Discharge: None

## 2012-05-08 NOTE — Discharge Summary (Signed)
Internal Medicine Teaching Service Attending Note Date: 05/08/2012  Patient name: [name]  Medical record number: [id number]  Date of birth: [birth year]    I evaluated the patient on the day of discharge and discussed the discharge plan with my resident team. I agree with the discharge documentation and disposition.  Thank you for working with me on this patient's care.   Thanks Madilyn Fireman 05/08/2012, 11:42 AM

## 2012-05-11 ENCOUNTER — Other Ambulatory Visit (INDEPENDENT_AMBULATORY_CARE_PROVIDER_SITE_OTHER): Payer: Self-pay | Admitting: Surgery

## 2012-05-11 ENCOUNTER — Ambulatory Visit (INDEPENDENT_AMBULATORY_CARE_PROVIDER_SITE_OTHER): Payer: PRIVATE HEALTH INSURANCE | Admitting: Surgery

## 2012-05-11 ENCOUNTER — Encounter (INDEPENDENT_AMBULATORY_CARE_PROVIDER_SITE_OTHER): Payer: Self-pay | Admitting: Surgery

## 2012-05-11 VITALS — BP 122/88 | HR 72 | Temp 98.4°F | Resp 16 | Ht 72.0 in | Wt 208.0 lb

## 2012-05-11 DIAGNOSIS — C189 Malignant neoplasm of colon, unspecified: Secondary | ICD-10-CM

## 2012-05-11 NOTE — Progress Notes (Signed)
This is a postoperative visit for a 61 year old male who has had a very complicated postoperative course. On 02/06/12 he underwent a laparoscopic assisted right hemicolectomy. This was for adenocarcinoma T3 N0. The patient was discharged home several days later in good health. However he came in in septic shock several days later and underwent exploration. He was noted to have a leak at his anastomosis. He underwent resection of his anastomosis. Subsequently his abdomen was closed several days later with an end ileostomy and a long Hartman's pouch reaching the transverse colon.  Since that time he has had at least 2 hospital admissions for renal failure and severe dehydration due to to ileostomy output. His ileostomy output is now under control with cholestyramine Imodium and aggressive oral hydration. His renal function is improved but is not yet normal. He is back home and seems to be doing quite well. He denies any pain. His ostomy is functioning well and he is having no problems with his pouch. His incision is open slightly but seems to be well granulated. He is performing daily dressing changes himself.  Filed Vitals:   05/11/12 1402  BP: 122/88  Pulse: 72  Temp: 98.4 F (36.9 C)  Resp: 16    His right lower quadrant ileostomy is pink with no sign of prolapse or parastomal hernia. There is some yellowish output with some consistency to it. His midline incision is almost healed. There is a 2 x 4 cm area that is well granulated. Some of the edges show abundant granulation tissue and this area was cauterized with silver nitrate. Continue dressing changes with a 2 x 2 gauze.We will recheck the patient in one month. We will likely need to wait about 6 months to reverse his ileostomy. This will put [place] in early March.  Imogene Burn. [name], MD, [facility]  05/11/2012 6:07 PM

## 2012-05-12 ENCOUNTER — Telehealth: Payer: Self-pay | Admitting: *Deleted

## 2012-05-12 NOTE — Telephone Encounter (Signed)
Spoke with patient by phone and explained role of nurse navigator.  Contact name and phone numbers were given to patient for [facility].  Confirmed appointment with Dr. Benay Spice on 05/28/12.  He was appreciative of the phone call.

## 2012-05-18 ENCOUNTER — Telehealth: Payer: Self-pay | Admitting: *Deleted

## 2012-05-18 ENCOUNTER — Ambulatory Visit (INDEPENDENT_AMBULATORY_CARE_PROVIDER_SITE_OTHER): Payer: No Typology Code available for payment source | Admitting: Internal Medicine

## 2012-05-18 ENCOUNTER — Encounter: Payer: Self-pay | Admitting: Internal Medicine

## 2012-05-18 VITALS — BP 114/67 | HR 77 | Temp 97.9°F | Ht 72.0 in | Wt 210.0 lb

## 2012-05-18 DIAGNOSIS — E119 Type 2 diabetes mellitus without complications: Secondary | ICD-10-CM

## 2012-05-18 NOTE — Patient Instructions (Addendum)
General Instructions:  Please follow-up at the clinic in 1-2 months, at which time we will reevaluate your blood pressure, diabetes - Please make sure this is with your PCP - OR, please follow-up in the clinic sooner if needed.  There have not been changes in your medications.    If you have been started on new medication(s), and you develop symptoms concerning for allergic reaction, including, but not limited to, throat closing, tongue swelling, rash, please stop the medication immediately and call the clinic at [phone], and go to the ER.  If you are diabetic, please bring your meter to your next visit.  If symptoms worsen, or new symptoms arise, please call the clinic or go to the ER.  PLEASE BRING ALL OF YOUR MEDICATIONS  IN A BAG TO YOUR NEXT APPOINTMENT   Treatment Goals:  Goals (1 Years of Data) as of 05/18/2012          As of Today 05/11/12 04/27/12 04/22/12 04/22/12     Blood Pressure    . Blood Pressure < 130/80  106/64 122/88 129/77 122/70 120/74    . Blood Pressure < 140/90  106/64 122/88 129/77 122/70 120/74    . Blood Pressure < 140/90  106/64 122/88 129/77 122/70 120/74     Lifestyle    . Quit smoking / using tobacco           Result Component    . HEMOGLOBIN A1C < 7.0          . HEMOGLOBIN A1C < 7.0          . LDL CALC < 100          . LDL CALC < 100          . LDL CALC < 100            Progress Toward Treatment Goals:    Self Care Goals & Plans:  Self Care Goal 05/18/2012  Manage my medications take my medicines as prescribed; bring my medications to every visit; refill my medications on time  Monitor my health keep track of my blood pressure  Eat healthy foods eat foods that are low in salt; eat more vegetables  Be physically active (No Data)       Care Management & Community Referrals:

## 2012-05-18 NOTE — Progress Notes (Signed)
Patient: [name]   [name]  DOB: 1951-04-21  PCP: [name], MD   Subjective:    HPI: [name] is a 61 y.o. male with a PMHx of diet controlled type 2 diabetes, colon adenocarcinoma status post right hemicolectomy complicated by anastomotic leak with intra-abdominal abscess, hypertension, hyperlipidemia, recent prolonged hospitalization secondary to acute kidney injury in the setting of high output ileostomy who presented to clinic today  to meet his primary care physician, however was scheduled with me.  1) Hypertension - remains well-controlled, but the patient has been taken off of all of his antihypertensive medications, and remains with well controlled blood pressures.   2) Type 2 diabetes mellitus, A1c 6.2 in September 2013 - no longer checking his blood sugars, no longer on medications.  3) AKI - no longer having nausea, vomiting, diarrhea from his ileostomy. He is now on Imodium, which he states is working well for him. No fevers, chills.   Review of Systems: Per HPI.   Current Outpatient Medications: Medication Sig  . atorvastatin (LIPITOR) 20 MG tablet Take 20 mg by mouth at bedtime.  Marland Kitchen loperamide (IMODIUM) 2 MG capsule Take 1 capsule (2 mg total) by mouth 4 (four) times daily as needed for diarrhea or loose stools (for very loose ileostomy output; output should be thicker (pancake batter consistency) not watery).  . pantoprazole (PROTONIX) 40 MG tablet Take 1 tablet (40 mg total) by mouth daily at 6 (six) AM.    Allergies: No Known Allergies  Past Medical History  Diagnosis Date  . Hyperlipidemia   . Hypertension   . Heart murmur   . Diabetes mellitus     type 2 IDDM x 15 years  . Sleep apnea     does not wear CPAP now  . Colon polyp   . Arthritis     left knee  . 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 12/25/2011    Past Surgical History  Procedure Date  . Colonoscopy   . Ganglion cyst excision 20 YRS AGO    RT  ARM  . Partial colectomy 02/06/2012  . Partial colectomy 02/06/2012    Procedure: PARTIAL COLECTOMY;  Surgeon: Imogene Burn. [name], MD;  Location: [place];  Service: General;  Laterality: N/A;  right partial colectomy  . Colonoscopy 02/06/2012    Procedure: COLONOSCOPY;  Surgeon: Milus Banister, MD;  Location: [place];  Service: Endoscopy;;  . Laparotomy 02/16/2012    Procedure: EXPLORATORY LAPAROTOMY;  Surgeon: Zenovia Jarred, MD;  Location: [place];  Service: General;  Laterality: N/A;  Exploratory Laparotomy with resection of anastomosis  . Ileostomy 02/16/2012    Procedure: ILEOSTOMY;  Surgeon: Zenovia Jarred, MD;  Location: [place];  Service: General;  Laterality: N/A;  . Application of wound vac 02/16/2012    Procedure: APPLICATION OF WOUND VAC;  Surgeon: Zenovia Jarred, MD;  Location: [place];  Service: General;  Laterality: N/A;  . Laparotomy 02/18/2012    Procedure: EXPLORATORY LAPAROTOMY;  Surgeon: Odis Hollingshead, MD;  Location: [place];  Service: General;  Laterality: N/A;  exploratory laparotomy  change of abdominal vac dressing  . Dressing change under anesthesia 02/18/2012    Procedure: DRESSING CHANGE UNDER ANESTHESIA;  Surgeon: Odis Hollingshead, MD;  Location: [place];  Service: General;  Laterality: N/A;  . Laparotomy 02/20/2012    Procedure: EXPLORATORY LAPAROTOMY;  Surgeon: Odis Hollingshead, MD;  Location: [place];  Service: General;  Laterality: N/A;  .  Vacuum assisted closure change 02/20/2012    Procedure: ABDOMINAL VACUUM ASSISTED CLOSURE CHANGE;  Surgeon: Odis Hollingshead, MD;  Location: [place];  Service: General;;  . Laparotomy 02/23/2012    Procedure: EXPLORATORY LAPAROTOMY;  Surgeon: Joyice Faster. Cornett, MD;  Location: [place];  Service: General;  Laterality: N/A;  Irrigation and Debridement of abdominal wound with wound vac change with partial closure  . Incision and drainage of wound 02/23/2012    Procedure: IRRIGATION AND DEBRIDEMENT WOUND;  Surgeon: Joyice Faster. Cornett, MD;  Location: [place];  Service: General;  Laterality: N/A;  . Vacuum assisted closure change 02/23/2012    Procedure: ABDOMINAL VACUUM ASSISTED CLOSURE CHANGE;  Surgeon: Joyice Faster. Cornett, MD;  Location: [place];  Service: General;  Laterality: N/A;  . Laparotomy 02/26/2012    Procedure: EXPLORATORY LAPAROTOMY;  Surgeon: Imogene Burn. [name], MD;  Location: [place];  Service: General;  Laterality: N/A;     . Application of wound vac 02/26/2012    Procedure: APPLICATION OF WOUND VAC;  Surgeon: Imogene Burn. [name], MD;  Location: [facility] [facility];  Service: General;  Laterality: N/A;     Objective:    Physical Exam: Filed Vitals:   05/18/12 1122  BP: 106/64  Pulse: 71  Temp: 97.9 F (36.6 C)      General: Vital signs reviewed and noted. Well-developed, well-nourished, in no acute distress; alert, appropriate and cooperative throughout examination.  Head: Normocephalic, atraumatic.  Lungs:  Normal respiratory effort. Clear to auscultation BL without crackles or wheezes.  Heart: RRR. S1 and S2 normal without gallop, rubs. (+) murmur.  Abdomen:  BS normoactive. Soft, Nondistended, non-tender.  No masses or organomegaly. Ileostomy noted in RLQ without surrounding erythema, warmth, tenderness.  Extremities: No pretibial edema.    Assessment/ Plan:   Case and plan of care discussed with attending physician, Dr. Larey Dresser.   Patient's case was discussed with Dr. Lynnae January today. We agree, that given that the patient has no acute medical issues to address at today's encounter, this will count as a no charge visit. The patient will be recommended to followup with his PCP in 1-2 months.  1) hypertension - remains well controlled off of any medications, therefore, will not initiate any medications today.  2) AKI - most recent serum creatinine improving to 1.8 from prior 2.3 during hospital course. No longer having the nausea, vomiting, diarrhea. No change made today.  3) type 2 diabetes mellitus, well-controlled, diet  controlled - not on any medications at this time, we'll continue to monitor, due for eye exam, however the patient prefers to wait until after he gets his Medicaid.

## 2012-05-18 NOTE — Telephone Encounter (Signed)
Received phone call from patient confirming appointment with Dr. Benay Spice on 05/28/12.  He verbalized understanding and verified location of [facility].

## 2012-05-25 ENCOUNTER — Ambulatory Visit: Payer: Self-pay | Admitting: Oncology

## 2012-05-25 ENCOUNTER — Ambulatory Visit: Payer: Self-pay

## 2012-05-28 ENCOUNTER — Ambulatory Visit: Payer: No Typology Code available for payment source | Admitting: Lab

## 2012-05-28 ENCOUNTER — Encounter: Payer: Self-pay | Admitting: Oncology

## 2012-05-28 ENCOUNTER — Telehealth: Payer: Self-pay | Admitting: Oncology

## 2012-05-28 ENCOUNTER — Ambulatory Visit (HOSPITAL_BASED_OUTPATIENT_CLINIC_OR_DEPARTMENT_OTHER): Payer: No Typology Code available for payment source | Admitting: Oncology

## 2012-05-28 ENCOUNTER — Ambulatory Visit: Payer: No Typology Code available for payment source

## 2012-05-28 VITALS — BP 111/69 | HR 75 | Temp 97.4°F | Resp 18 | Ht 70.5 in | Wt 210.2 lb

## 2012-05-28 DIAGNOSIS — D509 Iron deficiency anemia, unspecified: Secondary | ICD-10-CM

## 2012-05-28 DIAGNOSIS — C184 Malignant neoplasm of transverse colon: Secondary | ICD-10-CM

## 2012-05-28 DIAGNOSIS — C189 Malignant neoplasm of colon, unspecified: Secondary | ICD-10-CM

## 2012-05-28 NOTE — Progress Notes (Signed)
[facility] Patient Consult   Referring MD: Ilo Tartaglione 61 y.o.  05/21/1951    Reason for Referral: Colon cancer     HPI: He reports a history of colon polyps. He underwent a colonoscopy by [name] on 11/18/2011. A mass was found in the distal transverse colon. The mass was tattooed and biopsies were obtained. The pathology revealed fragments of adenomatous colon mucosa. High-grade dysplasia was present. No definite invasive carcinoma was identified.  He was referred to Dr. [name] and underwent a right hemicolectomy on 02/06/2012. Intraoperative colonoscopy identified a mass in the proximal transverse colon at the hepatic flexure. The pathology [id number]) confirmed an invasive well-differentiated adenocarcinoma. Adenocarcinoma focally involved the pericolonic soft tissue. The surgical resection margins were negative. 15 lymph nodes had no evidence of carcinoma. Tumor perforation was not identified. Tumor arose in a serrated adenoma with high-grade glandular dysplasia. Lymphovascular and perineural invasion were not identified. The tumor showed no evidence of microsatellite instability.   He had a complicated postoperative course readmission with septic shot secondary to an anastomotic leak. He underwent resection of the anastomosis and creation of an and ileostomy. He had 2 subsequent hospital admission for renal failure and severe dehydration secondary to the ileostomy output.  The ileostomy output is now under better control with a medical regimen. The midline incision has almost completely healed. He feels well at present.  Past Medical History  Diagnosis Date  . Hyperlipidemia   . Hypertension   . Heart murmur   . Diabetes mellitus     type 2 IDDM x 15 years  . Sleep apnea     does not wear CPAP now  . Colon polyp   . Arthritis     left knee  . 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 12/25/2011   .    Transverse colon  cancer (T3 N0), status post a right hemicolectomy          02/06/2012  .    Acute renal failure secondary to high output ileostomy and nausea/vomiting, last admitted on 04/13/2012  Past Surgical History  Procedure Date  . Colonoscopy   . Ganglion cyst excision 20 YRS AGO    RT ARM  . Partial colectomy 02/06/2012  . Partial colectomy 02/06/2012    Procedure: PARTIAL COLECTOMY;  Surgeon: Imogene Burn. [name], MD;  Location: [place];  Service: General;  Laterality: N/A;  right partial colectomy  . Colonoscopy 02/06/2012    Procedure: COLONOSCOPY;  Surgeon: Milus Banister, MD;  Location: [place];  Service: Endoscopy;;  . Laparotomy 02/16/2012    Procedure: EXPLORATORY LAPAROTOMY;  Surgeon: Zenovia Jarred, MD;  Location: [place];  Service: General;  Laterality: N/A;  Exploratory Laparotomy with resection of anastomosis  . Ileostomy 02/16/2012    Procedure: ILEOSTOMY;  Surgeon: Zenovia Jarred, MD;  Location: [place];  Service: General;  Laterality: N/A;  . Application of wound vac 02/16/2012    Procedure: APPLICATION OF WOUND VAC;  Surgeon: Zenovia Jarred, MD;  Location: [place];  Service: General;  Laterality: N/A;  . Laparotomy 02/18/2012    Procedure: EXPLORATORY LAPAROTOMY;  Surgeon: Odis Hollingshead, MD;  Location: [place];  Service: General;  Laterality: N/A;  exploratory laparotomy  change of abdominal vac dressing  . Dressing change under anesthesia 02/18/2012    Procedure: DRESSING CHANGE UNDER ANESTHESIA;  Surgeon: Odis Hollingshead, MD;  Location: [place];  Service: General;  Laterality: N/A;  .  Laparotomy 02/20/2012    Procedure: EXPLORATORY LAPAROTOMY;  Surgeon: Odis Hollingshead, MD;  Location: [place];  Service: General;  Laterality: N/A;  . Vacuum assisted closure change 02/20/2012    Procedure: ABDOMINAL VACUUM ASSISTED CLOSURE CHANGE;  Surgeon: Odis Hollingshead, MD;  Location: [place];  Service: General;;  . Laparotomy 02/23/2012    Procedure: EXPLORATORY LAPAROTOMY;  Surgeon: Joyice Faster. Cornett, MD;   Location: [place];  Service: General;  Laterality: N/A;  Irrigation and Debridement of abdominal wound with wound vac change with partial closure  . Incision and drainage of wound 02/23/2012    Procedure: IRRIGATION AND DEBRIDEMENT WOUND;  Surgeon: Joyice Faster. Cornett, MD;  Location: [place];  Service: General;  Laterality: N/A;  . Vacuum assisted closure change 02/23/2012    Procedure: ABDOMINAL VACUUM ASSISTED CLOSURE CHANGE;  Surgeon: Joyice Faster. Cornett, MD;  Location: [place];  Service: General;  Laterality: N/A;  . Laparotomy 02/26/2012    Procedure: EXPLORATORY LAPAROTOMY;  Surgeon: Imogene Burn. [name], MD;  Location: [place];  Service: General;  Laterality: N/A;     . Application of wound vac 02/26/2012    Procedure: APPLICATION OF WOUND VAC;  Surgeon: Imogene Burn. [name], MD;  Location: [facility];  Service: General;  Laterality: N/A;    Family History  Problem Relation Age of Onset  . Diabetes Father   . Heart disease Father   . Colon cancer Neg Hx   . Diabetes Sister   . Heart disease Sister   . Heart disease Mother    no family history of colon, rectal , or uterine cancer. 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 He smokes cigarettes. Occasional alcohol use. No risk factor for HIV or hepatitis.  History  Alcohol Use  . Yes    Comment: LIQUOR OR WINE WEEKLY    History  Smoking status  . Current Every Day Smoker -- 0.5 packs/day for 30 years  . Types: Cigarettes  Smokeless tobacco  . Never Used     Comment: faxing referral to quitline when signed by patient     ROS:   Positives include: Decreased visual acuity, "rash "over the Botox while in the hospital-resolved  A complete ROS was otherwise negative.  Physical Exam:  Blood pressure 111/69, pulse 75, temperature 97.4 F (36.3 C), temperature source Oral, resp. rate 18, height 5' 10.5" (1.791 m), weight 210 lb 3.2 oz (95.346 kg).  HEENT: There is enlargement of the right compared to the left tonsil (he reports this is chronic), oropharynx without visible mass, neck without mass Lungs: Lungs clear bilaterally Cardiac: Regular rate and rhythm Abdomen: No hepatosplenomegaly, ileostomy with semi-formed stool, small open areas at the upper and midportion of the midline incision appears to be almost completely healed. GU: Testes without mass  Vascular: No leg edema Lymph nodes: "Shotty "right axillary nodes. No cervical, supraclavicular, or inguinal nodes Neurologic: Alert and oriented, the motor exam appears intact in the upper and lower extremities   LAB:  CBC  Lab Results  Component Value Date   WBC 9.2 04/15/2012   HGB 9.0* 04/15/2012   HCT 25.9* 04/15/2012   MCV 69.4* 04/15/2012   PLT 443* 04/15/2012     CMP      Component Value Date/Time   NA 136 04/27/2012 1458   K 5.1 04/27/2012 1458   CL 114* 04/27/2012 1458   CO2 17* 04/27/2012 1458   GLUCOSE 89 04/27/2012 1458   BUN 23 04/27/2012 1458   CREATININE 1.83* 04/27/2012 1458   CREATININE 2.34* 04/22/2012 0605   CALCIUM 8.3* 04/27/2012 1458   PROT 7.1 04/14/2012 0610   ALBUMIN 2.2* 04/22/2012 0605   AST 32 04/14/2012 0610   ALT 25 04/14/2012 0610   ALKPHOS 156* 04/14/2012 0610   BILITOT 0.8 04/14/2012 0610   GFRNONAA 29* 04/22/2012 0605   GFRAA 33* 04/22/2012 0605     Radiology: CT of the abdomen and pelvis on 03/30/2012-the lung bases are clear, the liver, adrenal glands, spleen, and pancreas appear normal, extensive infiltration of the omental fat,  lymph nodes in the porta hepatis measuring up to 1.8 cm . Chest x-ray on 01/30/2012-lungs are clear.    Assessment/Plan:   1. Adenocarcinoma of the transverse colon, stage II (T3 N0), status post a right hemicolectomy on 02/06/2012. The tumor was microsatellite stable.  2. Postoperative anastomotic leak/septic shock, status post resection of the anastomosis and creation of an ileostomy  3. Repeat admissions with dehydration/acute renal failure secondary to a high output ileostomy  4. Diabetes  5. Microcytic anemia August 2013-predating surgery   Disposition:   He was diagnosed with stage II colon cancer when he underwent a right hemicolectomy in September of 2013. He had a complicated postoperative course and continues to recover. I discussed the diagnosis of stage II colon cancer, prognosis, and adjuvant treatment options with Mr. Magouirk. We reviewed the surgical pathology report. I do not recommend adjuvant chemotherapy. His tumor does not have "high-risk" features and he is now greater than 3 months out from the initial surgery.  He should continue colonoscopy followup with [name]. We obtained a baseline CEA today. He will return for an office visit and CEA in 6 months. He appears to have iron deficiency with a persistent microcytic anemia when the hemoglobin was last checked. We will recommend he begin ferrous sulfate and check a CBC in approximately one month. [place], [place] 05/28/2012, 2:30 PM

## 2012-05-28 NOTE — Progress Notes (Signed)
Reports he still has open abdominal wound that his daughter provides daily wound care at direction of [place].

## 2012-05-28 NOTE — Progress Notes (Signed)
Checked in new pt with no financial concerns. °

## 2012-05-28 NOTE — Telephone Encounter (Signed)
Gave pt appt for June 2014 lab and MD. Pt sent to lab today

## 2012-05-29 ENCOUNTER — Telehealth: Payer: Self-pay | Admitting: *Deleted

## 2012-05-29 ENCOUNTER — Telehealth: Payer: Self-pay | Admitting: Oncology

## 2012-05-29 DIAGNOSIS — D509 Iron deficiency anemia, unspecified: Secondary | ICD-10-CM

## 2012-05-29 LAB — CEA: CEA: 1 ng/mL (ref 0.0–5.0)

## 2012-05-29 MED ORDER — FERROUS SULFATE 325 (65 FE) MG PO TBEC: 325.0000 mg | DELAYED_RELEASE_TABLET | Freq: Two times a day (BID) | ORAL | Status: DC

## 2012-05-29 NOTE — Telephone Encounter (Signed)
called pt and he is aware of his 06/30/12 lab appt    anne

## 2012-05-29 NOTE — Telephone Encounter (Signed)
Per Dr Vista Mink, pt is to begin taking OTC ferrous sulfate 325mg  BID and need CBC in 1 month.  Pt verbalized understanding with instructions. SLJ

## 2012-06-05 ENCOUNTER — Telehealth (INDEPENDENT_AMBULATORY_CARE_PROVIDER_SITE_OTHER): Payer: Self-pay | Admitting: General Surgery

## 2012-06-05 NOTE — Telephone Encounter (Signed)
Called [name] back to [facility] and told him to do the 4 weeks

## 2012-06-05 NOTE — Telephone Encounter (Signed)
Scott, nurse with [facility], called to request another 4 weeks of bi-weekly visits to monitor wound healing.  Also, [facility] can send out a Education officer, museum to review pt needs overall.  Please advise.

## 2012-06-10 ENCOUNTER — Encounter (INDEPENDENT_AMBULATORY_CARE_PROVIDER_SITE_OTHER): Payer: Self-pay | Admitting: Surgery

## 2012-06-10 ENCOUNTER — Ambulatory Visit (INDEPENDENT_AMBULATORY_CARE_PROVIDER_SITE_OTHER): Payer: Medicaid Other | Admitting: Surgery

## 2012-06-10 VITALS — BP 130/84 | HR 77 | Temp 99.9°F | Resp 14 | Ht 71.5 in | Wt 220.8 lb

## 2012-06-10 DIAGNOSIS — C189 Malignant neoplasm of colon, unspecified: Secondary | ICD-10-CM

## 2012-06-10 NOTE — Progress Notes (Signed)
The patient returns for another postoperative visit. His midline incision is almost completely healed. He has 2 small areas less than a centimeter each with some granulation tissue. Both of these are cauterized with silver nitrate. He has no abdominal tenderness. His appetite is improving. His ileostomy is still putting out fairly thick output and he is maintaining hydration. His ileostomy is pink with no sign of prolapse. He should treat the wounds with Neosporin and dry dressings until completely healed. Followup in one month. At that point we will hopefully be able to schedule his ileostomy reversal in early March.  [name]. [name], MD, [facility]  06/10/2012 2:46 PM

## 2012-06-17 ENCOUNTER — Encounter: Payer: Self-pay | Admitting: Internal Medicine

## 2012-06-17 ENCOUNTER — Encounter (INDEPENDENT_AMBULATORY_CARE_PROVIDER_SITE_OTHER): Payer: Self-pay

## 2012-06-17 ENCOUNTER — Ambulatory Visit (INDEPENDENT_AMBULATORY_CARE_PROVIDER_SITE_OTHER): Payer: Medicaid Other | Admitting: Internal Medicine

## 2012-06-17 VITALS — BP 116/70 | HR 71 | Temp 97.5°F | Ht 72.0 in | Wt 212.5 lb

## 2012-06-17 DIAGNOSIS — I10 Essential (primary) hypertension: Secondary | ICD-10-CM

## 2012-06-17 DIAGNOSIS — M25562 Pain in left knee: Secondary | ICD-10-CM

## 2012-06-17 DIAGNOSIS — C189 Malignant neoplasm of colon, unspecified: Secondary | ICD-10-CM

## 2012-06-17 DIAGNOSIS — G4733 Obstructive sleep apnea (adult) (pediatric): Secondary | ICD-10-CM

## 2012-06-17 DIAGNOSIS — D539 Nutritional anemia, unspecified: Secondary | ICD-10-CM

## 2012-06-17 DIAGNOSIS — Z79899 Other long term (current) drug therapy: Secondary | ICD-10-CM

## 2012-06-17 DIAGNOSIS — E119 Type 2 diabetes mellitus without complications: Secondary | ICD-10-CM

## 2012-06-17 DIAGNOSIS — D509 Iron deficiency anemia, unspecified: Secondary | ICD-10-CM

## 2012-06-17 DIAGNOSIS — E43 Unspecified severe protein-calorie malnutrition: Secondary | ICD-10-CM

## 2012-06-17 DIAGNOSIS — F17200 Nicotine dependence, unspecified, uncomplicated: Secondary | ICD-10-CM

## 2012-06-17 LAB — CBC
HCT: 31.4 % — ABNORMAL LOW (ref 39.0–52.0)
Hemoglobin: 10.6 g/dL — ABNORMAL LOW (ref 13.0–17.0)
MCH: 26.6 pg (ref 26.0–34.0)
MCHC: 33.8 g/dL (ref 30.0–36.0)
MCV: 78.9 fL (ref 78.0–100.0)
Platelets: 251 10*3/uL (ref 150–400)
RBC: 3.98 MIL/uL — ABNORMAL LOW (ref 4.22–5.81)
RDW: 18.6 % — ABNORMAL HIGH (ref 11.5–15.5)
WBC: 7 10*3/uL (ref 4.0–10.5)

## 2012-06-17 LAB — BASIC METABOLIC PANEL WITH GFR
BUN: 34 mg/dL — ABNORMAL HIGH (ref 6–23)
CO2: 15 mEq/L — ABNORMAL LOW (ref 19–32)
Calcium: 9.1 mg/dL (ref 8.4–10.5)
Chloride: 115 mEq/L — ABNORMAL HIGH (ref 96–112)
Creat: 2.3 mg/dL — ABNORMAL HIGH (ref 0.50–1.35)
GFR, Est African American: 34 mL/min — ABNORMAL LOW
GFR, Est Non African American: 30 mL/min — ABNORMAL LOW
Glucose, Bld: 79 mg/dL (ref 70–99)
Potassium: 5.5 mEq/L — ABNORMAL HIGH (ref 3.5–5.3)
Sodium: 137 mEq/L (ref 135–145)

## 2012-06-17 LAB — GLUCOSE, CAPILLARY: Glucose-Capillary: 83 mg/dL (ref 70–99)

## 2012-06-17 LAB — POCT GLYCOSYLATED HEMOGLOBIN (HGB A1C): Hemoglobin A1C: 4.7

## 2012-06-17 MED ORDER — FERROUS SULFATE 325 (65 FE) MG PO TBEC: 325.0000 mg | DELAYED_RELEASE_TABLET | Freq: Two times a day (BID) | ORAL | Status: DC

## 2012-06-17 MED ORDER — LOPERAMIDE HCL 2 MG PO CAPS: 2.0000 mg | ORAL_CAPSULE | Freq: Four times a day (QID) | ORAL | Status: DC | PRN

## 2012-06-17 MED ORDER — ATORVASTATIN CALCIUM 20 MG PO TABS: 20.0000 mg | ORAL_TABLET | Freq: Every day | ORAL | Status: DC

## 2012-06-17 MED ORDER — NICOTINE 21 MG/24HR TD PT24: 1.0000 | MEDICATED_PATCH | TRANSDERMAL | Status: DC

## 2012-06-17 NOTE — Assessment & Plan Note (Signed)
He is motivated to stop smoking. Have prescribed nicotine patches and is willing to try them. Have emphasized to the patient about the importance of cigarette smoking cessation. He will follow with me in 6 weeks.

## 2012-06-17 NOTE — Assessment & Plan Note (Signed)
His blood pressure is 116/17, without any anti-hypertensive medications. Plan This problem will be resolved.

## 2012-06-17 NOTE — Assessment & Plan Note (Signed)
The patient's diabetes seemed to be in remission with progressively decreasing A1c. Today it is 4.7%. I believe his diabetes edition remission following the reduction in his weight from 286 to 212.

## 2012-06-17 NOTE — Progress Notes (Signed)
Patient ID: [name]., male   DOB: Sep 26, 1950, 62 y.o.   MRN: [id number]  Subjective:   Patient ID: [name]. male   DOB: 1951/01/21 62 y.o.   MRN: [id number]  HPI: RodneyPeyton Channin [name]. is a 62 y.o. with past medical history of hyperlipidemia, hypertension, diabetes, type II, in remission, and a recent colon cancer. He presents to the clinic to establish care with me as his PCP.   His only complaints include tiredness, requiring a lot of assistance from his daughter. He recently got his Medicaid and he requests to have a home health aide 5 times a week. He has trouble is with doing some of the activities of daily living like taking a bath and dressing himself. His been having home health care once a week since his operation in September 2013. He has also been getting assistance from his daughter, who is soon moving out of the house.  Colon cancer: Colonoscopy 11/18/2011 revealed a mass was found in the distal transverse colon and well-differentiated adenocarcinoma. He underwent a right hemicolectomy on 02/06/2012. Adenocarcinoma focally involved the pericolonic soft tissue. The surgical resection margins were negative. 15 lymph nodes had no evidence of carcinoma. Tumor perforation was not identified. The tumor showed no evidence of microsatellite instability. CEA 05/28/2012 - 1.0 (not elevated). Patient follows closely with oncology and surgery services. He had a complicated postoperative course readmission with septic shot secondary to an anastomotic leak. He underwent resection of the anastomosis and creation of an and ileostomy. He had 2 subsequent hospital admission for renal failure and severe dehydration secondary to the ileostomy output. No doing better. His colostomy is being well. His incision sites are healing nicely. He is awaiting the reversal of the ileostomy in March 2014.  Diabetes type 2: [name] has a history of type II diabetes, which is in remission. His A1c in  2010, was as high as 11.4%. This gradually improved to 6.7% and the most recent one done today in the office of 4.7. His weight has greatly improved from 286 pounds one year ago to 212 pounds today.  Hypertension: Patient has a reported history of hypertension with his blood pressure is being in the ranges of 140 to 150s over 80s to 90s  between 2010-2011. Since stopping his medications a few months ago his blood pressure has been well controlled.   Acute kidney injury: [name] had a complicated postoperative period with dehydration and acute renal insufficiency. After admissions for this program, his renal function has gradually improved to the last creatinine of 1.87 when he was last seen in the clinic.   Past Medical History  Diagnosis Date  . Hyperlipidemia   . Hypertension   . Heart murmur   . Diabetes mellitus     type 2 IDDM x 15 years  . Sleep apnea     does not wear CPAP now  . Colon polyp   . Arthritis     left knee  . 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 12/25/2011   Current Outpatient Prescriptions  Medication Sig Dispense Refill  . atorvastatin (LIPITOR) 20 MG tablet Take 1 tablet (20 mg total) by mouth at bedtime.  90 tablet  1  . ferrous sulfate 325 (65 FE) MG EC tablet Take 1 tablet (325 mg total) by mouth 2 (two) times daily.  120 tablet  0  . loperamide (IMODIUM) 2 MG capsule Take 1 capsule (2 mg total) by mouth 4 (four) times daily as needed  for diarrhea or loose stools (for very loose ileostomy output; output should be thicker (pancake batter consistency) not watery).  30 capsule  3  . nicotine (NICODERM CQ - DOSED IN MG/24 HOURS) 21 mg/24hr patch Place 1 patch onto the skin daily.  42 patch  0   Family History  Problem Relation Age of Onset  . Diabetes Father   . Heart disease Father   . Colon cancer Neg Hx   . Diabetes Sister   . Heart disease Sister   . Heart disease Mother    History   Social History  . Marital Status: Divorced    Spouse  Name: N/A    Number of Children: 3  . Years of Education: N/A   Occupational History  .      Worked at [employer] Topics  . Smoking status: Current Every Day Smoker -- 0.3 packs/day for 30 years    Types: Cigarettes  . Smokeless tobacco: Never Used  . Alcohol Use: Yes     Comment: Liquor twice monthly.  . Drug Use: No  . Sexually Active: None   Other Topics Concern  . None   Social History Narrative   Financial assistance approved for 100% discount at [facility] and has [place]  August 08, 2009 5:48 PM*PATIENT WAS GIVEN DM CARD.Lela Sturdivant NT II  June 08, 2010 3:56 PMDivorced, one daughter currently staying with himHas 7 grand childrenReports disability and medicaid is pending   Review of Systems: Constitutional: Denies fever, chills, diaphoresis, and appetite change. However, he reports excessive fatigue, sometimes requiring assistance with taking a bath and dressing himself.  HEENT: Denies photophobia, eye pain, redness, hearing loss, ear pain, congestion, sore throat, rhinorrhea, sneezing, mouth sores, trouble swallowing, neck pain, neck stiffness and tinnitus.   Respiratory: Denies SOB, DOE, cough, chest tightness,  and wheezing.   Cardiovascular: Denies chest pain, palpitations and leg swelling.  Gastrointestinal: Denies nausea, vomiting, abdominal pain, diarrhea, constipation, blood in stool and abdominal distention.  Genitourinary: Denies dysuria, urgency, frequency, hematuria, flank pain and difficulty urinating.  Musculoskeletal: Denies myalgias, back pain, joint swelling, arthralgias and gait problem.  Skin: Denies pallor, rash and wound.  Neurological: Denies dizziness, seizures, syncope, weakness, light-headedness, numbness and headaches.  Hematological: Denies adenopathy. Easy bruising, personal or family bleeding history  Psychiatric/Behavioral: Denies suicidal ideation, mood changes, confusion, nervousness, sleep disturbance and  agitation  Objective:  Physical Exam: Filed Vitals:   06/17/12 1326  BP: 116/70  Pulse: 71  Temp: 97.5 F (36.4 C)  TempSrc: Oral  Height: 6' (1.829 m)  Weight: 212 lb 8 oz (96.389 kg)  SpO2: 99%   Constitutional: Vital signs reviewed.  The patient appears tired, but without acute distress. He is cooperative with exam. Alert and oriented x3.  Head: Normocephalic and atraumatic Ear: TM normal bilaterally Mouth: no erythema or exudates, MMM Eyes: PERRL, EOMI, conjunctivae normal, No scleral icterus.  Neck: Supple, Trachea midline normal ROM, No JVD, mass, thyromegaly, or carotid bruit present.  Cardiovascular: RRR, S1 normal, S2 normal, no MRG, pulses symmetric and intact bilaterally Pulmonary/Chest: CTAB, no wheezes, rales, or rhonchi Abdominal: Soft. Non-tender, non-distended, bowel sounds are normal, no masses, organomegaly, or guarding present. His midline incision is healing nicely. His ileostomy is functioning. GU: no CVA tenderness Musculoskeletal: No joint deformities, erythema, or stiffness, ROM full and no nontender Hematology: no cervical, inginal, or axillary adenopathy.  Neurological: A&O x3, Strength is normal and symmetric bilaterally, cranial nerve  II-XII are grossly intact, no focal motor deficit, sensory intact to light touch bilaterally.  Skin: Warm, dry and intact. No rash, cyanosis, or clubbing.  Psychiatric: Normal mood and affect. speech and behavior is normal. Judgment and thought content normal. Cognition and memory are normal.   Assessment & Plan:  I have discussed the assessment and plan for the care of Mr. Raper with Dr. Lynnae January as detailed under each problem.

## 2012-06-17 NOTE — Assessment & Plan Note (Signed)
His tumor was found to be local without distant metastasis. His lymph node biopsies intraoperatively and negative. He is following closely with surgery and oncology. Plan -Will continue to follow. -The patient is planning to undergo reversal of the ileostomy in March 2014. -I will send his medications, including iron tablets to his pharmacy.

## 2012-06-17 NOTE — Patient Instructions (Signed)
You will be contacted regarding the home aid  Please continue taking your medications. I have sent them to the [facility] pharmacy on [address], [place], [place] [address] Please come back to see me the last week of February  Please stop smoking. I have prescribed some nicotine patches for you  I will contact you if needed for your blood results.  General Instructions:    Treatment Goals:  Goals (1 Years of Data) as of 06/17/2012          As of Today 06/10/12 05/28/12 05/18/12 05/18/12     Blood Pressure    . Blood Pressure < 130/80  116/70 130/84 111/69 114/67 104/65    . Blood Pressure < 140/90  116/70 130/84 111/69 114/67 104/65    . Blood Pressure < 140/90  116/70 130/84 111/69 114/67 104/65     Lifestyle    . Quit smoking / using tobacco           Result Component    . HEMOGLOBIN A1C < 7.0  4.7        . HEMOGLOBIN A1C < 7.0  4.7        . LDL CALC < 100          . LDL CALC < 100          . LDL CALC < 100            Progress Toward Treatment Goals:  Treatment Goal 06/17/2012  Hemoglobin A1C at goal  Blood pressure at goal  Stop smoking smoking less    Self Care Goals & Plans:  Self Care Goal 06/17/2012  Manage my medications bring my medications to every visit; take my medicines as prescribed  Monitor my health keep track of my weight  Eat healthy foods eat foods that are low in salt; eat more vegetables; drink diet soda or water instead of juice or soda; eat fruit for snacks and desserts  Be physically active take a walk every day  Stop smoking call [facility] (1-800-QUIT-NOW)       Smoking Cessation Quitting smoking is important to your health and has many advantages. However, it is not always easy to quit since nicotine is a very addictive drug. Often times, people try 3 times or more before being able to quit. This document explains the best ways for you to prepare to quit smoking. Quitting takes hard work and a lot of effort, but you can do it. ADVANTAGES OF  QUITTING SMOKING  You will live longer, feel better, and live better.  Your body will feel the impact of quitting smoking almost immediately.  Within 20 minutes, blood pressure decreases. Your pulse returns to its normal level.  After 8 hours, carbon monoxide levels in the blood return to normal. Your oxygen level increases.  After 24 hours, the chance of having a heart attack starts to decrease. Your breath, hair, and body stop smelling like smoke.  After 48 hours, damaged nerve endings begin to recover. Your sense of taste and smell improve.  After 72 hours, the body is virtually free of nicotine. Your bronchial tubes relax and breathing becomes easier.  After 2 to 12 weeks, lungs can hold more air. Exercise becomes easier and circulation improves.  The risk of having a heart attack, stroke, cancer, or lung disease is greatly reduced.  After 1 year, the risk of coronary heart disease is cut in half.  After 5 years, the risk of stroke falls to the  same as a nonsmoker.  After 10 years, the risk of lung cancer is cut in half and the risk of other cancers decreases significantly.  After 15 years, the risk of coronary heart disease drops, usually to the level of a nonsmoker.  If you are pregnant, quitting smoking will improve your chances of having a healthy baby.  The people you live with, especially any children, will be healthier.  You will have extra money to spend on things other than cigarettes. QUESTIONS TO THINK ABOUT BEFORE ATTEMPTING TO QUIT You may want to talk about your answers with your caregiver.  Why do you want to quit?  If you tried to quit in the past, what helped and what did not?  What will be the most difficult situations for you after you quit? How will you plan to handle them?  Who can help you through the tough times? Your family? Friends? A caregiver?  What pleasures do you get from smoking? What ways can you still get pleasure if you quit? Here are  some questions to ask your caregiver:  How can you help me to be successful at quitting?  What medicine do you think would be best for me and how should I take it?  What should I do if I need more help?  What is smoking withdrawal like? How can I get information on withdrawal? GET READY  Set a quit date.  Change your environment by getting rid of all cigarettes, ashtrays, matches, and lighters in your home, car, or work. Do not let people smoke in your home.  Review your past attempts to quit. Think about what worked and what did not. GET SUPPORT AND ENCOURAGEMENT You have a better chance of being successful if you have help. You can get support in many ways.  Tell your family, friends, and co-workers that you are going to quit and need their support. Ask them not to smoke around you.  Get individual, group, or telephone counseling and support. Programs are available at [employer] and health centers. Call your local health department for information about programs in your area.  Spiritual beliefs and practices may help some smokers quit.  Download a "quit meter" on your computer to keep track of quit statistics, such as how long you have gone without smoking, cigarettes not smoked, and money saved.  Get a self-help book about quitting smoking and staying off of tobacco. Pioneer yourself from urges to smoke. Talk to someone, go for a walk, or occupy your time with a task.  Change your normal routine. Take a different route to work. Drink tea instead of coffee. Eat breakfast in a different place.  Reduce your stress. Take a hot bath, exercise, or read a book.  Plan something enjoyable to do every day. Reward yourself for not smoking.  Explore interactive web-based programs that specialize in helping you quit. GET MEDICINE AND USE IT CORRECTLY Medicines can help you stop smoking and decrease the urge to smoke. Combining medicine with the  above behavioral methods and support can greatly increase your chances of successfully quitting smoking.  Nicotine replacement therapy helps deliver nicotine to your body without the negative effects and risks of smoking. Nicotine replacement therapy includes nicotine gum, lozenges, inhalers, nasal sprays, and skin patches. Some may be available over-the-counter and others require a prescription.  Antidepressant medicine helps people abstain from smoking, but how this works is unknown. This medicine is available by prescription.  Nicotinic receptor partial agonist medicine simulates the effect of nicotine in your brain. This medicine is available by prescription. Ask your caregiver for advice about which medicines to use and how to use them based on your health history. Your caregiver will tell you what side effects to look out for if you choose to be on a medicine or therapy. Carefully read the information on the package. Do not use any other product containing nicotine while using a nicotine replacement product.  RELAPSE OR DIFFICULT SITUATIONS Most relapses occur within the first 3 months after quitting. Do not be discouraged if you start smoking again. Remember, most people try several times before finally quitting. You may have symptoms of withdrawal because your body is used to nicotine. You may crave cigarettes, be irritable, feel very hungry, cough often, get headaches, or have difficulty concentrating. The withdrawal symptoms are only temporary. They are strongest when you first quit, but they will go away within 10 14 days. To reduce the chances of relapse, try to:  Avoid drinking alcohol. Drinking lowers your chances of successfully quitting.  Reduce the amount of caffeine you consume. Once you quit smoking, the amount of caffeine in your body increases and can give you symptoms, such as a rapid heartbeat, sweating, and anxiety.  Avoid smokers because they can make you want to smoke.  Do  not let weight gain distract you. Many smokers will gain weight when they quit, usually less than 10 pounds. Eat a healthy diet and stay active. You can always lose the weight gained after you quit.  Find ways to improve your mood other than smoking. FOR MORE INFORMATION  www.smokefree.gov  Document Released: 05/14/2001 Document Revised: 11/19/2011 Document Reviewed: 08/29/2011 [facility] Patient Information 2013 [place].

## 2012-06-18 ENCOUNTER — Telehealth: Payer: Self-pay | Admitting: *Deleted

## 2012-06-18 NOTE — Telephone Encounter (Signed)
Received forms for MD to complete for orders to approve personal care assistance services. Dr. Benay Spice declines to sign. Patient is not being treated for his cancer. Needs to follow up with surgeon or PCP for these orders. Faxed forms back to facility with that message attached.

## 2012-06-21 ENCOUNTER — Telehealth: Payer: Self-pay | Admitting: Internal Medicine

## 2012-06-21 NOTE — Telephone Encounter (Signed)
I called [name] and informed him about the results of his BMET which showed a reduced renal function which might indicate CKD. I will follow up with him on this issues and will not hesitate a referral to a nephrologist for further evaluation a treatment. I will discuss with him more when he comes for his next visit with me.

## 2012-06-23 ENCOUNTER — Encounter (INDEPENDENT_AMBULATORY_CARE_PROVIDER_SITE_OTHER): Payer: Self-pay | Admitting: Surgery

## 2012-06-23 ENCOUNTER — Ambulatory Visit (INDEPENDENT_AMBULATORY_CARE_PROVIDER_SITE_OTHER): Payer: Medicaid Other | Admitting: Surgery

## 2012-06-23 VITALS — BP 112/66 | HR 68 | Temp 98.7°F | Resp 20 | Ht 74.0 in | Wt 212.2 lb

## 2012-06-23 DIAGNOSIS — R198 Other specified symptoms and signs involving the digestive system and abdomen: Secondary | ICD-10-CM

## 2012-06-23 DIAGNOSIS — Z932 Ileostomy status: Secondary | ICD-10-CM

## 2012-06-23 DIAGNOSIS — C189 Malignant neoplasm of colon, unspecified: Secondary | ICD-10-CM

## 2012-06-23 NOTE — Progress Notes (Signed)
The patient returns for a postop visit. His midline incision seems to be completely healed. His ileostomy is functioning well. His output remains fairly thick as long as he takes Imodium. Recent blood work shows that his hemoglobin A1c is down to 4.7. He is no longer taking blood pressure medication. His renal function remains marginal with a recent creatinine of 2.3. He has established care with Dr. Alice Rieger.  Filed Vitals:   06/23/12 1408  BP: 112/66  Pulse: 68  Temp: 98.7 F (37.1 C)  Resp: 20    Midline incision is well-healed. There is 1 small dot of granulation tissue that seems to be drying up nicely. No drainage noted. The patient has good appetite and no significant abdominal tenderness. His ileostomy shows no sign of prolapse.  Lab Results  Component Value Date   WBC 7.0 06/17/2012   HGB 10.6* 06/17/2012   HCT 31.4* 06/17/2012   MCV 78.9 06/17/2012   PLT 251 06/17/2012   Lab Results  Component Value Date   CREATININE 2.30* 06/17/2012   BUN 34* 06/17/2012   NA 137 06/17/2012   K 5.5* 06/17/2012   CL 115* 06/17/2012   CO2 15* 06/17/2012   Lab Results  Component Value Date   ALT 25 04/14/2012   AST 32 04/14/2012   ALKPHOS 156* 04/14/2012   BILITOT 0.8 04/14/2012   Lab Results  Component Value Date   HGB 10.6* 06/17/2012   Lab Results  Component Value Date   HGBA1C 4.7 06/17/2012    We will reevaluate the patient in mid February. We will plan his ileostomy reversal and scar excision for March.  Imogene Burn. [name], MD, [facility]  06/23/2012 6:16 PM

## 2012-06-24 ENCOUNTER — Ambulatory Visit (INDEPENDENT_AMBULATORY_CARE_PROVIDER_SITE_OTHER): Payer: Medicaid Other | Admitting: Internal Medicine

## 2012-06-24 ENCOUNTER — Encounter: Payer: Self-pay | Admitting: Internal Medicine

## 2012-06-24 VITALS — BP 126/80 | HR 72 | Temp 97.1°F | Ht 72.0 in | Wt 213.7 lb

## 2012-06-24 DIAGNOSIS — N179 Acute kidney failure, unspecified: Secondary | ICD-10-CM

## 2012-06-24 DIAGNOSIS — N183 Chronic kidney disease, stage 3 unspecified: Secondary | ICD-10-CM | POA: Insufficient documentation

## 2012-06-24 DIAGNOSIS — N189 Chronic kidney disease, unspecified: Secondary | ICD-10-CM

## 2012-06-24 DIAGNOSIS — E875 Hyperkalemia: Secondary | ICD-10-CM | POA: Insufficient documentation

## 2012-06-24 NOTE — Progress Notes (Signed)
Patient ID: [name]., male   DOB: [birth year], 62 y.o.   MRN: [id number] Patient ID: [name]., male   DOB: 10/22/1950, 62 y.o.   MRN: [id number]  Subjective:   Patient ID: [name]. male   DOB: 05-18-1951 62 y.o.   MRN: [id number]  HPI: RodneyRodney Cammeron [name]. is a 62 y.o. with past medical history of hyperlipidemia, hypertension, diabetes, type II, in remission, and a recent colon cancer. He presents to the clinic to establish care with me as his PCP.   He returns to the clinic to discuss with me his recent labs, which showed persistent and possibly worsening kidney disease. He has no other complaints today.  Colon cancer: Colonoscopy 11/18/2011 revealed a mass was found in the distal transverse colon and well-differentiated adenocarcinoma. He underwent a right hemicolectomy on 02/06/2012. Adenocarcinoma focally involved the pericolonic soft tissue. The surgical resection margins were negative. 15 lymph nodes had no evidence of carcinoma. Tumor perforation was not identified. The tumor showed no evidence of microsatellite instability. CEA 05/28/2012 - 1.0 (not elevated). Patient follows closely with oncology and surgery services. He had a complicated postoperative course readmission with septic shot secondary to an anastomotic leak. He underwent resection of the anastomosis and creation of an and ileostomy. He had 2 subsequent hospital admission for renal failure and severe dehydration secondary to the ileostomy output. No doing better. His colostomy is being well. His incision sites are healing nicely. He is awaiting the reversal of the ileostomy in March 2014.  Diabetes type 2: [name] has a history of type II diabetes, which is in remission. His A1c in 2010, was as high as 11.4%. This gradually improved to 6.7% and the most recent one done today in the office of 4.7. His weight has greatly improved from 286 pounds one year ago to 213 pounds today.  Hypertension: Patient  has a reported history of hypertension with his blood pressure is being in the ranges of 140 to 150s over 80s to 90s  between 2010-2011. Since stopping his medications a few months ago his blood pressure has been well controlled.   Acute kidney injury: [name] had a complicated postoperative period with dehydration and acute renal insufficiency. After admissions for this program, his renal function has gradually improved to the last creatinine of 1.87 when he was last seen in the clinic.   Past Medical History  Diagnosis Date  . Hyperlipidemia   . Hypertension     for a few years and resolved in 2013/2014.  Marland Kitchen Heart murmur   . Diabetes mellitus     type 2 IDDM x 15 years  . Sleep apnea     does not wear CPAP now  . Colon polyp   . Arthritis     left knee  . 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 12/25/2011   Current Outpatient Prescriptions  Medication Sig Dispense Refill  . Ascorbic Acid (VITAMIN C) 100 MG tablet Take 100 mg by mouth daily.      Marland Kitchen atorvastatin (LIPITOR) 20 MG tablet Take 1 tablet (20 mg total) by mouth at bedtime.  90 tablet  1  . ferrous sulfate 325 (65 FE) MG EC tablet Take 1 tablet (325 mg total) by mouth 2 (two) times daily.  120 tablet  0  . loperamide (IMODIUM) 2 MG capsule Take 1 capsule (2 mg total) by mouth 4 (four) times daily as needed for diarrhea or loose stools (for very loose ileostomy output; output  should be thicker (pancake batter consistency) not watery).  30 capsule  3  . nicotine (NICODERM CQ - DOSED IN MG/24 HOURS) 21 mg/24hr patch Place 1 patch onto the skin daily.  42 patch  0   Family History  Problem Relation Age of Onset  . Diabetes Father   . Heart disease Father   . Colon cancer Neg Hx   . Diabetes Sister   . Heart disease Sister   . Heart disease Mother    History   Social History  . Marital Status: Divorced    Spouse Name: N/A    Number of Children: 3  . Years of Education: N/A   Occupational History  .       Worked at [employer] Topics  . Smoking status: Current Every Day Smoker -- 0.3 packs/day for 30 years    Types: Cigarettes  . Smokeless tobacco: Never Used  . Alcohol Use: Yes     Comment: Liquor twice monthly.  . Drug Use: No  . Sexually Active: None   Other Topics Concern  . None   Social History Narrative   Financial assistance approved for 100% discount at [facility] and has [place]  August 08, 2009 5:48 PM*PATIENT WAS GIVEN DM CARD.Lela Sturdivant NT II  June 08, 2010 3:56 PMDivorced, one daughter currently staying with himHas 7 grand childrenReports disability and medicaid is pending   Review of Systems: Constitutional: Denies fever, chills, diaphoresis, and appetite change. However, he reports excessive fatigue, sometimes requiring assistance with taking a bath and dressing himself.  HEENT: Denies photophobia, eye pain, redness, hearing loss, ear pain, congestion, sore throat, rhinorrhea, sneezing, mouth sores, trouble swallowing, neck pain, neck stiffness and tinnitus.   Respiratory: Denies SOB, DOE, cough, chest tightness,  and wheezing.   Cardiovascular: Denies chest pain, palpitations and leg swelling.  Gastrointestinal: Denies nausea, vomiting, abdominal pain, diarrhea, constipation, blood in stool and abdominal distention.  Genitourinary: Denies dysuria, urgency, frequency, hematuria, flank pain and difficulty urinating.  Musculoskeletal: Denies myalgias, back pain, joint swelling, arthralgias and gait problem.  Skin: Denies pallor, rash and wound.  Neurological: Denies dizziness, seizures, syncope, weakness, light-headedness, numbness and headaches.  Hematological: Denies adenopathy. Easy bruising, personal or family bleeding history  Psychiatric/Behavioral: Denies suicidal ideation, mood changes, confusion, nervousness, sleep disturbance and agitation  Objective:  Physical Exam: Filed Vitals:   06/24/12 1311  BP: 126/80  Pulse: 72    Temp: 97.1 F (36.2 C)  TempSrc: Oral  Height: 6' (1.829 m)  Weight: 213 lb 11.2 oz (96.934 kg)  SpO2: 100%   Constitutional: Vital signs reviewed.  The patient appears tired, but without acute distress. He is cooperative with exam. Alert and oriented x3.  Head: Normocephalic and atraumatic Ear: TM normal bilaterally Mouth: no erythema or exudates, MMM Eyes: PERRL, EOMI, conjunctivae normal, No scleral icterus.  Neck: Supple, Trachea midline normal ROM, No JVD, mass, thyromegaly, or carotid bruit present.  Cardiovascular: RRR, S1 normal, S2 normal, no MRG, pulses symmetric and intact bilaterally Pulmonary/Chest: CTAB, no wheezes, rales, or rhonchi Abdominal: Soft. Non-tender, non-distended, bowel sounds are normal, no masses, organomegaly, or guarding present. His midline incision is healing nicely. His ileostomy is functioning. GU: no CVA tenderness Musculoskeletal: No joint deformities, erythema, or stiffness, ROM full and no nontender Hematology: no cervical, inginal, or axillary adenopathy.  Neurological: A&O x3, Strength is normal and symmetric bilaterally, cranial nerve II-XII are grossly intact, no focal motor deficit, sensory intact  to light touch bilaterally.  Skin: Warm, dry and intact. No rash, cyanosis, or clubbing.  Psychiatric: Normal mood and affect. speech and behavior is normal. Judgment and thought content normal. Cognition and memory are normal.   Assessment & Plan:  I have discussed the assessment and plan for the care of [name] with Dr. Lynnae January as detailed under each problem.

## 2012-06-24 NOTE — Assessment & Plan Note (Signed)
His potassium level was found to be high at 5.5, when it was checked last week.  Plan -Will do a repeat basic metabolic panel, and if the potassium level is still high to be high, I will contact the patient and prescribe Kayexalate.

## 2012-06-24 NOTE — Assessment & Plan Note (Signed)
I have discussed with [name], about the possibility that his acute kidney injury could have triggered the sequela of chronic kidney disease. Have reassured him that I will continue to follow up with him and a possible referral to a nephrologist. However, priority is a reversal of the ileostomy which is going to happen in March 2014.

## 2012-06-24 NOTE — Patient Instructions (Addendum)
Please avoid oranges and Bananas since these tend to have a lot of potassium  Please avoid medications that can cause further injury to your kidneys like aspirin and, ibuprofen   Whenever you start a new medication, please tell your doctor about your kidney function   You will follow come back to clinic for blood checks in two weeks      Hyperkalemia Hyperkalemia is when you have too much potassium in your blood. This can be a life-threatening condition. Potassium is normally removed (excreted) from the body by the kidneys. CAUSES  The potassium level in your body can become too high for the following reasons:  You take in too much potassium. You can do this by:  Using salt substitutes. They contain large amounts of potassium.  Taking potassium supplements from your caregiver. The dose may be too high for you.  Eating foods or taking nutritional products with potassium.  You excrete too little potassium. This can happen if:  Your kidneys are not functioning properly. Kidney (renal) disease is a very common cause of hyperkalemia.  You are taking medicines that lower your excretion of potassium, such as certain diuretic medicines.  You have an adrenal gland disease called Addison's disease.  You have a urinary tract obstruction, such as kidney stones.  You are on treatment to mechanically clean your blood (dialysis) and you skip a treatment.  You release a high amount of potassium from your cells into your blood. You may have a condition that causes potassium to move from your cells to your bloodstream. This can happen with:  Injury to muscles or other tissues. Most potassium is stored in the muscles.  Severe burns or infections.  Acidic blood plasma (acidosis). Acidosis can result from many diseases, such as uncontrolled diabetes. SYMPTOMS  Usually, there are no symptoms unless the potassium is dangerously high or has risen very quickly. Symptoms may  include:  Irregular or very slow heartbeat.  Feeling sick to your stomach (nauseous).  Tiredness (fatigue).  Nerve problems such as tingling of the skin, numbness of the hands or feet, weakness, or paralysis. DIAGNOSIS  A simple blood test can measure the amount of potassium in your body. An electrocardiogram test of the heart can also help make the diagnosis. The heart may beat dangerously fast or slow down and stop beating with severe hyperkalemia.  TREATMENT  Treatment depends on how bad the condition is and on the underlying cause.  If the hyperkalemia is an emergency (causing heart problems or paralysis), many different medicines can be used alone or together to lower the potassium level briefly. This may include an insulin injection even if you are not diabetic. Emergency dialysis may be needed to remove potassium from the body.  If the hyperkalemia is less severe or dangerous, the underlying cause is treated. This can include taking medicines if needed. Your prescription medicines may be changed. You may also need to take a medicine to help your body get rid of potassium. You may need to eat a diet low in potassium. HOME CARE INSTRUCTIONS   Take medicines and supplements as directed by your caregiver.  Do not take any over-the-counter medicines, supplements, natural products, herbs, or vitamins without reviewing them with your caregiver. Certain supplements and natural food products can have high amounts of potassium. Other products (such as ibuprofen) can damage weak kidneys and raise your potassium.  You may be asked to do repeat lab tests. Be sure to follow these directions.  If you  have kidney disease, you may need to follow a low potassium diet. SEEK MEDICAL CARE IF:   You notice an irregular or very slow heartbeat.  You feel lightheaded.  You develop weakness that is unusual for you. SEEK IMMEDIATE MEDICAL CARE IF:   You have shortness of breath.  You have chest  discomfort.  You pass out (faint). MAKE SURE YOU:   Understand these instructions.  Will watch your condition.  Will get help right away if you are not doing well or get worse. Document Released: 05/10/2002 Document Revised: 08/12/2011 Document Reviewed: 10/25/2010 [facility] Patient Information 2013 [place].

## 2012-06-25 LAB — BASIC METABOLIC PANEL WITH GFR
BUN: 43 mg/dL — ABNORMAL HIGH (ref 6–23)
CO2: 14 mEq/L — ABNORMAL LOW (ref 19–32)
Calcium: 9.5 mg/dL (ref 8.4–10.5)
Chloride: 115 mEq/L — ABNORMAL HIGH (ref 96–112)
Creat: 3.26 mg/dL — ABNORMAL HIGH (ref 0.50–1.35)
GFR, Est African American: 22 mL/min — ABNORMAL LOW
GFR, Est Non African American: 19 mL/min — ABNORMAL LOW
Glucose, Bld: 67 mg/dL — ABNORMAL LOW (ref 70–99)
Potassium: 5.3 mEq/L (ref 3.5–5.3)
Sodium: 134 mEq/L — ABNORMAL LOW (ref 135–145)

## 2012-06-25 LAB — URINALYSIS, ROUTINE W REFLEX MICROSCOPIC
Bilirubin Urine: NEGATIVE
Glucose, UA: NEGATIVE mg/dL
Hgb urine dipstick: NEGATIVE
Ketones, ur: NEGATIVE mg/dL
Leukocytes, UA: NEGATIVE
Nitrite: NEGATIVE
Protein, ur: 30 mg/dL — AB
Specific Gravity, Urine: 1.017 (ref 1.005–1.030)
Urobilinogen, UA: 0.2 mg/dL (ref 0.0–1.0)
pH: 5.5 (ref 5.0–8.0)

## 2012-06-25 LAB — URINALYSIS, MICROSCOPIC ONLY
Bacteria, UA: NONE SEEN
Casts: NONE SEEN
Crystals: NONE SEEN
Squamous Epithelial / LPF: NONE SEEN

## 2012-06-29 ENCOUNTER — Other Ambulatory Visit: Payer: Self-pay | Admitting: Internal Medicine

## 2012-06-29 DIAGNOSIS — T8132XA Disruption of internal operation (surgical) wound, not elsewhere classified, initial encounter: Secondary | ICD-10-CM

## 2012-06-29 DIAGNOSIS — N183 Chronic kidney disease, stage 3 unspecified: Secondary | ICD-10-CM

## 2012-06-29 DIAGNOSIS — T81320A Disruption or dehiscence of gastrointestinal tract anastomosis, repair, or closure, initial encounter: Secondary | ICD-10-CM

## 2012-06-29 DIAGNOSIS — R198 Other specified symptoms and signs involving the digestive system and abdomen: Secondary | ICD-10-CM

## 2012-06-29 DIAGNOSIS — E43 Unspecified severe protein-calorie malnutrition: Secondary | ICD-10-CM

## 2012-06-30 ENCOUNTER — Other Ambulatory Visit (HOSPITAL_BASED_OUTPATIENT_CLINIC_OR_DEPARTMENT_OTHER): Payer: Medicaid Other

## 2012-06-30 DIAGNOSIS — D509 Iron deficiency anemia, unspecified: Secondary | ICD-10-CM

## 2012-06-30 LAB — CBC WITH DIFFERENTIAL/PLATELET
BASO%: 0.5 % (ref 0.0–2.0)
Basophils Absolute: 0 10*3/uL (ref 0.0–0.1)
EOS%: 2.2 % (ref 0.0–7.0)
Eosinophils Absolute: 0.2 10*3/uL (ref 0.0–0.5)
HCT: 29 % — ABNORMAL LOW (ref 38.4–49.9)
HGB: 10 g/dL — ABNORMAL LOW (ref 13.0–17.1)
LYMPH%: 39.4 % (ref 14.0–49.0)
MCH: 27.1 pg — ABNORMAL LOW (ref 27.2–33.4)
MCHC: 34.4 g/dL (ref 32.0–36.0)
MCV: 78.6 fL — ABNORMAL LOW (ref 79.3–98.0)
MONO#: 0.5 10*3/uL (ref 0.1–0.9)
MONO%: 6.3 % (ref 0.0–14.0)
NEUT#: 3.8 10*3/uL (ref 1.5–6.5)
NEUT%: 51.6 % (ref 39.0–75.0)
Platelets: 201 10*3/uL (ref 140–400)
RBC: 3.69 10*6/uL — ABNORMAL LOW (ref 4.20–5.82)
RDW: 16.1 % — ABNORMAL HIGH (ref 11.0–14.6)
WBC: 7.5 10*3/uL (ref 4.0–10.3)
lymph#: 2.9 10*3/uL (ref 0.9–3.3)

## 2012-07-06 ENCOUNTER — Telehealth: Payer: Self-pay | Admitting: Licensed Clinical Social Worker

## 2012-07-06 NOTE — Telephone Encounter (Signed)
[name]' PCP placed referral to CSW to initiate PCS request.  CSW placed call to [name] to inquire if he had an agency preference to be listed on the request.  PCS request initiated.

## 2012-07-08 ENCOUNTER — Telehealth: Payer: Self-pay | Admitting: *Deleted

## 2012-07-08 ENCOUNTER — Encounter: Payer: Self-pay | Admitting: Internal Medicine

## 2012-07-08 NOTE — Progress Notes (Signed)
Patient ID: [name], male   DOB: [birth year], 62 y.o.   MRN: [id number]  I have arranged a referral for Mr Ngai to see Dr Jimmy Footman of [facility] regarding his worsening renal function with hyperkalemia and metabolic acidosis. I called Mr Klauser and I strongly encouraged him to keep appt with Dr Jimmy Footman. They will call him in a week.

## 2012-07-08 NOTE — Telephone Encounter (Signed)
Spoke with patient about his Hgb being lower. Confirmed that referral to renal doctor is good idea. Poor renal function could be contributing to his anemia. Strongly encouraged him to keep that appointment.

## 2012-07-08 NOTE — Telephone Encounter (Signed)
Message copied by Tania Ade on Wed Jul 08, 2012  2:35 PM ------      Message from: Ladell Pier      Created: Tue Jul 07, 2012  9:12 PM       Cont. Iron, can f/u with primary md. Check hb with [place] next visit      ----- Message -----         From: Tania Ade, RN         Sent: 07/06/2012   9:37 AM           To: Ladell Pier, MD            Dr. Benay Spice-      Hgb is lower at 10.0 in Jan. Do you want him in sooner to recheck counts?      ----- Message -----         From: Ladell Pier, MD         Sent: 07/01/2012   6:21 PM           To: Tania Ade, RN, Ludwig Lean, RN, #            Please call patient hb is better, cont. Iron , f/u as scheduled with [facility] next visit

## 2012-07-13 ENCOUNTER — Ambulatory Visit (INDEPENDENT_AMBULATORY_CARE_PROVIDER_SITE_OTHER): Payer: Medicaid Other | Admitting: Surgery

## 2012-07-13 ENCOUNTER — Encounter (INDEPENDENT_AMBULATORY_CARE_PROVIDER_SITE_OTHER): Payer: Self-pay | Admitting: Surgery

## 2012-07-13 VITALS — BP 132/68 | HR 72 | Temp 98.2°F | Resp 16 | Ht 71.5 in | Wt 218.8 lb

## 2012-07-13 DIAGNOSIS — R198 Other specified symptoms and signs involving the digestive system and abdomen: Secondary | ICD-10-CM

## 2012-07-13 DIAGNOSIS — Z932 Ileostomy status: Secondary | ICD-10-CM

## 2012-07-13 NOTE — Progress Notes (Signed)
Patient ID: [name], male   DOB: Sep 15, 1950, 62 y.o.   MRN: [id number]  Chief Complaint  Patient presents with  . Re-evaluation    re-eval abd wd    HPI [name] is a 62 y.o. male.  HPI This is a postoperative visit for a 62 year old male who has had a very complicated postoperative course. On 02/06/12 he underwent a laparoscopic assisted right hemicolectomy. This was for adenocarcinoma T3 N0. The patient was discharged home several days later in good health. However he came in in septic shock several days later and underwent exploration. He was noted to have a leak at his anastomosis. He underwent resection of his anastomosis. Subsequently his abdomen was closed several days later with an end ileostomy and a long Hartman's pouch reaching the transverse colon. Since that time he has had at least 2 hospital admissions for renal failure and severe dehydration due to to ileostomy output. His ileostomy output is now under control with cholestyramine Imodium and aggressive oral hydration. His renal function remains abnormal.  He has been referred to Renal by his PCP.  His wound is almost completely healed.  He has good appetite and is maintaining good hydration.  Past Medical History  Diagnosis Date  . Hyperlipidemia   . Hypertension     for a few years and resolved in 2013/2014.  Marland Kitchen Heart murmur   . Diabetes mellitus     type 2 IDDM x 15 years  . Sleep apnea     does not wear CPAP now  . Colon polyp   . Arthritis     left knee  . 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 12/25/2011    Past Surgical History  Procedure Laterality Date  . Colonoscopy    . Ganglion cyst excision  20 YRS AGO    RT ARM  . Partial colectomy  02/06/2012  . Partial colectomy  02/06/2012    Procedure: PARTIAL COLECTOMY;  Surgeon: Imogene Burn. [name], MD;  Location: [facility];  Service: General;  Laterality: N/A;  right partial colectomy  . Colonoscopy  02/06/2012    Procedure: COLONOSCOPY;  Surgeon: Milus Banister, MD;  Location: [facility];  Service: Endoscopy;;  . Laparotomy  02/16/2012    Procedure: EXPLORATORY LAPAROTOMY;  Surgeon: Zenovia Jarred, MD;  Location: [facility];  Service: General;  Laterality: N/A;  Exploratory Laparotomy with resection of anastomosis  . Ileostomy  02/16/2012    Procedure: ILEOSTOMY;  Surgeon: Zenovia Jarred, MD;  Location: [facility];  Service: General;  Laterality: N/A;  . Application of wound vac  02/16/2012    Procedure: APPLICATION OF WOUND VAC;  Surgeon: Zenovia Jarred, MD;  Location: [facility];  Service: General;  Laterality: N/A;  . Laparotomy  02/18/2012    Procedure: EXPLORATORY LAPAROTOMY;  Surgeon: Odis Hollingshead, MD;  Location: [facility];  Service: General;  Laterality: N/A;  exploratory laparotomy  change of abdominal vac dressing  . Dressing change under anesthesia  02/18/2012    Procedure: DRESSING CHANGE UNDER ANESTHESIA;  Surgeon: Odis Hollingshead, MD;  Location: [facility];  Service: General;  Laterality: N/A;  . Laparotomy  02/20/2012    Procedure: EXPLORATORY LAPAROTOMY;  Surgeon: Odis Hollingshead, MD;  Location: [facility];  Service: General;  Laterality: N/A;  . Vacuum assisted closure change  02/20/2012    Procedure: ABDOMINAL VACUUM ASSISTED CLOSURE CHANGE;  Surgeon: Odis Hollingshead, MD;  Location: [facility];  Service: General;;  . Laparotomy  02/23/2012  Procedure: EXPLORATORY LAPAROTOMY;  Surgeon: Joyice Faster. Cornett, MD;  Location: [facility];  Service: General;  Laterality: N/A;  Irrigation and Debridement of abdominal wound with wound vac change with partial closure  . Incision and drainage of wound  02/23/2012    Procedure: IRRIGATION AND DEBRIDEMENT WOUND;  Surgeon: Joyice Faster. Cornett, MD;  Location: [facility];  Service: General;  Laterality: N/A;  . Vacuum assisted closure change  02/23/2012    Procedure: ABDOMINAL VACUUM ASSISTED CLOSURE CHANGE;  Surgeon: Joyice Faster. Cornett, MD;  Location: [facility];  Service: General;  Laterality: N/A;  . Laparotomy  02/26/2012    Procedure:  EXPLORATORY LAPAROTOMY;  Surgeon: Imogene Burn. [name], MD;  Location: [facility];  Service: General;  Laterality: N/A;     . Application of wound vac  02/26/2012    Procedure: APPLICATION OF WOUND VAC;  Surgeon: Imogene Burn. [name], MD;  Location: [facility];  Service: General;  Laterality: N/A;    Family History  Problem Relation Age of Onset  . Diabetes Father   . Heart disease Father   . Colon cancer Neg Hx   . Diabetes Sister   . Heart disease Sister   . Heart disease Mother     Social History History  Substance Use Topics  . Smoking status: Current Every Day Smoker -- 0.30 packs/day for 30 years    Types: Cigarettes  . Smokeless tobacco: Never Used  . Alcohol Use: Yes     Comment: Liquor twice monthly.    No Known Allergies  Current Outpatient Prescriptions  Medication Sig Dispense Refill  . Ascorbic Acid (VITAMIN C) 100 MG tablet Take 100 mg by mouth daily.      Marland Kitchen atorvastatin (LIPITOR) 20 MG tablet Take 1 tablet (20 mg total) by mouth at bedtime.  90 tablet  1  . ferrous sulfate 325 (65 FE) MG EC tablet Take 1 tablet (325 mg total) by mouth 2 (two) times daily.  120 tablet  0  . loperamide (IMODIUM) 2 MG capsule Take 1 capsule (2 mg total) by mouth 4 (four) times daily as needed for diarrhea or loose stools (for very loose ileostomy output; output should be thicker (pancake batter consistency) not watery).  30 capsule  3   No current facility-administered medications for this visit.    Review of Systems Review of Systems  Constitutional: Negative for fever, chills and unexpected weight change.  HENT: Negative for hearing loss, congestion, sore throat, trouble swallowing and voice change.   Eyes: Negative for visual disturbance.  Respiratory: Negative for cough and wheezing.   Cardiovascular: Negative for chest pain, palpitations and leg swelling.  Gastrointestinal: Positive for diarrhea and abdominal distention. Negative for nausea, vomiting, abdominal pain, constipation, blood in  stool, anal bleeding and rectal pain.  Genitourinary: Negative for hematuria and difficulty urinating.  Musculoskeletal: Negative for arthralgias.  Skin: Negative for rash and wound.  Neurological: Negative for seizures, syncope, weakness and headaches.  Hematological: Negative for adenopathy. Does not bruise/bleed easily.  Psychiatric/Behavioral: Negative for confusion.    Blood pressure 132/68, pulse 72, temperature 98.2 F (36.8 C), temperature source Oral, resp. rate 16, height 5' 11.5" (1.816 m), weight 218 lb 12.8 oz (99.247 kg).  Physical Exam Physical Exam WDWN in NAD HEENT:  EOMI, sclera anicteric Neck:  No masses, no thyromegaly Lungs:  CTA bilaterally; normal respiratory effort CV:  Regular rate and rhythm; no murmurs Abd:  +bowel sounds, soft, wound - 2 x 6 cm area of granulation tissue; RLQ ileostomy -  no prolapse or peristomal hernia.   Ext:  Well-perfused; no edema Skin:  Warm, dry; no sign of jaundice  Data Reviewed Lab Results  Component Value Date   WBC 7.5 06/30/2012   HGB 10.0* 06/30/2012   HCT 29.0* 06/30/2012   MCV 78.6* 06/30/2012   PLT 201 06/30/2012   Lab Results  Component Value Date   CREATININE 3.26* 06/24/2012   BUN 43* 06/24/2012   NA 134* 06/24/2012   K 5.3 06/24/2012   CL 115* 06/24/2012   CO2 14* 06/24/2012     Assessment    1.  Chronic renal dysfunction 2.  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 Jimmy Footman Plan ileostomy reversal/ lysis of adhesions/ ileocolic anastomosis.  The surgical procedure has been discussed with the patient.  Potential risks, benefits, alternative treatments, and expected outcomes have been explained.  All of the patient's questions at this time have been answered.  The likelihood of reaching the patient's treatment goal is good.  The patient understand the proposed surgical procedure and wishes to proceed.  We will schedule this in mid-March.          Lars Jeziorski K. 07/13/2012, 5:24 PM

## 2012-07-31 ENCOUNTER — Ambulatory Visit (INDEPENDENT_AMBULATORY_CARE_PROVIDER_SITE_OTHER): Payer: Medicaid Other | Admitting: Internal Medicine

## 2012-07-31 ENCOUNTER — Encounter: Payer: Self-pay | Admitting: Internal Medicine

## 2012-07-31 VITALS — BP 120/75 | HR 67 | Temp 97.4°F

## 2012-07-31 DIAGNOSIS — F17200 Nicotine dependence, unspecified, uncomplicated: Secondary | ICD-10-CM

## 2012-07-31 DIAGNOSIS — E875 Hyperkalemia: Secondary | ICD-10-CM

## 2012-07-31 DIAGNOSIS — K912 Postsurgical malabsorption, not elsewhere classified: Secondary | ICD-10-CM

## 2012-07-31 DIAGNOSIS — N183 Chronic kidney disease, stage 3 unspecified: Secondary | ICD-10-CM

## 2012-07-31 DIAGNOSIS — C189 Malignant neoplasm of colon, unspecified: Secondary | ICD-10-CM

## 2012-07-31 DIAGNOSIS — N189 Chronic kidney disease, unspecified: Secondary | ICD-10-CM

## 2012-07-31 DIAGNOSIS — E43 Unspecified severe protein-calorie malnutrition: Secondary | ICD-10-CM

## 2012-07-31 DIAGNOSIS — K90829 Short bowel syndrome, unspecified: Secondary | ICD-10-CM

## 2012-07-31 DIAGNOSIS — G4733 Obstructive sleep apnea (adult) (pediatric): Secondary | ICD-10-CM

## 2012-07-31 DIAGNOSIS — Z0000 Encounter for general adult medical examination without abnormal findings: Secondary | ICD-10-CM

## 2012-07-31 DIAGNOSIS — Z9889 Other specified postprocedural states: Secondary | ICD-10-CM

## 2012-07-31 DIAGNOSIS — E785 Hyperlipidemia, unspecified: Secondary | ICD-10-CM

## 2012-07-31 LAB — COMPLETE METABOLIC PANEL WITH GFR
ALT: 34 U/L (ref 0–53)
AST: 34 U/L (ref 0–37)
Albumin: 3.7 g/dL (ref 3.5–5.2)
Alkaline Phosphatase: 82 U/L (ref 39–117)
BUN: 22 mg/dL (ref 6–23)
CO2: 20 mEq/L (ref 19–32)
Calcium: 9 mg/dL (ref 8.4–10.5)
Chloride: 110 mEq/L (ref 96–112)
Creat: 2.16 mg/dL — ABNORMAL HIGH (ref 0.50–1.35)
GFR, Est African American: 37 mL/min — ABNORMAL LOW
GFR, Est Non African American: 32 mL/min — ABNORMAL LOW
Glucose, Bld: 86 mg/dL (ref 70–99)
Potassium: 4.7 mEq/L (ref 3.5–5.3)
Sodium: 139 mEq/L (ref 135–145)
Total Bilirubin: 0.6 mg/dL (ref 0.3–1.2)
Total Protein: 8.1 g/dL (ref 6.0–8.3)

## 2012-07-31 LAB — MAGNESIUM: Magnesium: 1.5 mg/dL (ref 1.5–2.5)

## 2012-07-31 LAB — TSH: TSH: 1.548 u[IU]/mL (ref 0.350–4.500)

## 2012-07-31 LAB — T4: T4, Total: 8 ug/dL (ref 5.0–12.5)

## 2012-07-31 LAB — T3, FREE: T3, Free: 2.6 pg/mL (ref 2.3–4.2)

## 2012-07-31 NOTE — Assessment & Plan Note (Signed)
He reports to be feeling fine. No swelling of his extremities, and he actually feels stronger. However, he needs evaluation by renal service as outpatient.  Plan -Basic metabolic panel, parathyroid hormone, TSH, T3 and T4, and a urine microalbumin to creatinine ratio. Patient already has an anemia panel. -Referral to nephrology

## 2012-07-31 NOTE — Patient Instructions (Addendum)
Please keep appointment with kidney doctors Please stop smoking  Please come back in about 6 weeks  Please continue to keep you appointments with surgery Treatment Goals:  Goals (1 Years of Data) as of 07/31/12         As of Today 07/13/12 06/24/12 06/23/12 06/17/12     Blood Pressure    . Blood Pressure < 130/80  120/75 132/68 126/80 112/66 116/70    . Blood Pressure < 140/90  120/75 132/68 126/80 112/66 116/70    . Blood Pressure < 140/90  120/75 132/68 126/80 112/66 116/70     Lifestyle    . Quit smoking / using tobacco           Result Component    . HEMOGLOBIN A1C < 7.0      4.7    . HEMOGLOBIN A1C < 7.0      4.7    . LDL CALC < 100          . LDL CALC < 100          . LDL CALC < 100            Progress Toward Treatment Goals:  Treatment Goal 07/31/2012  Hemoglobin A1C at goal  Blood pressure at goal  Stop smoking smoking less    Self Care Goals & Plans:  Self Care Goal 07/31/2012  Manage my medications take my medicines as prescribed  Monitor my health -  Eat healthy foods -  Be physically active -  Stop smoking call [facility] (1-800-QUIT-NOW); set a quit date and stop smoking       Care Management & Community Referrals:

## 2012-07-31 NOTE — Progress Notes (Signed)
Patient ID: [name], male   DOB: [birth year], 62 y.o.   MRN: [id number]  Subjective:   Patient ID: [name] male   DOB: 1951-04-04 62 y.o.   MRN: [id number]  HPI: [name] is a 62 y.o. with past medical history of cancer of the colon, chronic kidney disease, hypertension, and diabetes, in remission. He presents for routine followup visit. The patient has no complaints today and reports to be feeling well. He no longer needs to use a CPAP machine after losing 80lbs. He feels stronger.  He is awaiting reversal of the ileostomy after evaluation by nephrology.  Please see the A&P for the status of the pt's chronic medical problems.     Past Medical History  Diagnosis Date  . Hyperlipidemia   . Hypertension     for a few years and resolved in 2013/2014.  Marland Kitchen Heart murmur   . Diabetes mellitus     type 2 IDDM x 15 years  . Sleep apnea     does not wear CPAP now  . 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 Arthritis     left knee  . 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 12/25/2011   Current Outpatient Prescriptions  Medication Sig Dispense Refill  . Ascorbic Acid (VITAMIN C) 100 MG tablet Take 100 mg by mouth daily.      Marland Kitchen atorvastatin (LIPITOR) 20 MG tablet Take 1 tablet (20 mg total) by mouth at bedtime.  90 tablet  1  . ferrous sulfate 325 (65 FE) MG EC tablet Take 1 tablet (325 mg total) by mouth 2 (two) times daily.  120 tablet  0  . loperamide (IMODIUM) 2 MG capsule Take 1 capsule (2 mg total) by mouth 4 (four) times daily as needed for diarrhea or loose stools (for very loose ileostomy output; output should be thicker (pancake batter consistency) not watery).  30 capsule  3   No current facility-administered medications for this visit.   Family History  Problem Relation Age of Onset  . Diabetes Father   . Heart disease Father   . Colon cancer Neg Hx   . Diabetes Sister   . Heart disease Sister   . Heart  disease Mother    History   Social History  . Marital Status: Divorced    Spouse Name: N/A    Number of Children: 3  . Years of Education: N/A   Occupational History  .      Worked at [employer] Topics  . Smoking status: Current Every Day Smoker -- 0.30 packs/day for 30 years    Types: Cigarettes  . Smokeless tobacco: Never Used  . Alcohol Use: Yes     Comment: Liquor twice monthly.  . Drug Use: No  . Sexually Active: None   Other Topics Concern  . None   Social History Narrative   Financial assistance approved for 100% discount at [facility] and has [facility] card   [employer]  August 08, 2009 5:48 PM   *PATIENT WAS GIVEN DM CARD.Lela Sturdivant NT II  June 08, 2010 3:56 PM      Divorced, one daughter currently staying with him   Has 7 grand children   Reports disability and medicaid is pending   Review of Systems: Constitutional: Denies fever, chills, diaphoresis, appetite change and fatigue.  HEENT: Denies photophobia, eye pain, redness, hearing loss, ear pain, congestion, sore  throat, rhinorrhea, sneezing, mouth sores, trouble swallowing, neck pain, neck stiffness and tinnitus.   Respiratory: Denies SOB, DOE, cough, chest tightness,  and wheezing.   Cardiovascular: Denies chest pain, palpitations and leg swelling.  Gastrointestinal: Denies nausea, vomiting, abdominal pain, diarrhea, constipation, blood in stool and abdominal distention.  Genitourinary: Denies dysuria, urgency, frequency, hematuria, flank pain and difficulty urinating.  Musculoskeletal: Denies myalgias, back pain, joint swelling, arthralgias and gait problem.  Skin: Denies pallor, rash and wound.  Neurological: Denies dizziness, seizures, syncope, weakness, light-headedness, numbness and headaches.  Hematological: Denies adenopathy. Easy bruising, personal or family bleeding history  Psychiatric/Behavioral: Denies suicidal ideation, mood changes, confusion, nervousness, sleep disturbance  and agitation  Objective:  Physical Exam: Filed Vitals:   07/31/12 1331  BP: 120/75  Pulse: 67  Temp: 97.4 F (36.3 C)  TempSrc: Oral   Constitutional: Vital signs reviewed. The patient appears tired, but without acute distress. He is cooperative with exam. Alert and oriented x3.  Head: Normocephalic and atraumatic  Ear: TM normal bilaterally  Mouth: no erythema or exudates, MMM  Eyes: PERRL, EOMI, conjunctivae normal, No scleral icterus.  Neck: Supple, Trachea midline normal ROM, No JVD, mass, thyromegaly, or carotid bruit present.  Cardiovascular: RRR, S1 normal, S2 normal, no MRG, pulses symmetric and intact bilaterally  Pulmonary/Chest: CTAB, no wheezes, rales, or rhonchi  Abdominal: Soft. Non-tender, non-distended, bowel sounds are normal, no masses, organomegaly, or guarding present. His midline incision is healing nicely. His ileostomy is functioning.  GU: no CVA tenderness Musculoskeletal: No joint deformities, erythema, or stiffness, ROM full and no nontender Hematology: no cervical, inginal, or axillary adenopathy.  Neurological: A&O x3, Strength is normal and symmetric bilaterally, cranial nerve II-XII are grossly intact, no focal motor deficit, sensory intact to light touch bilaterally.  Skin: Warm, dry and intact. No rash, cyanosis, or clubbing.  Psychiatric: Normal mood and affect. speech and behavior is normal. Judgment and thought content normal. Cognition and memory are normal.   Assessment & Plan:  I have discussed my assessment, and plan for the care of Mr. Mitman, with Dr. Lynnae January. Please see my problem based charting.  In the brief, the patient will need to see nephrology before his upcoming surgery for ileostomy reversal. Have ordered a repeat basic metabolic panel, parathyroid hormone levels, and urine microalbumin to creatinine ratio.

## 2012-07-31 NOTE — Assessment & Plan Note (Signed)
  Assessment:  Progress toward smoking cessation:  smoking less  Barriers to progress toward smoking cessation:     Comments: He reports that he now smokes about 6 cigarettes as opposed to a whole pack per day.  Plan:  Instruction/counseling given:  I counseled patient on the dangers of tobacco use and advised patient to stop smoking.  Educational resources provided:  [employer] account manager) brochure  Self management tools provided:     Medications to assist with smoking cessation:  patient cannot afford to nicotine patches. have advised him to contact [facility]  for free nicotine patches  Patient agreed to the following self-care plans for smoking cessation:  call [employer] (1-800-QUIT-NOW);set a quit date and stop smoking   Other:

## 2012-07-31 NOTE — Assessment & Plan Note (Signed)
He will need to be evaluated by renal. I have made a referral to [facility]. Further plan is deferred to surgery and oncology.

## 2012-08-01 LAB — MICROALBUMIN / CREATININE URINE RATIO
Creatinine, Urine: 167.2 mg/dL
Microalb Creat Ratio: 18.5 mg/g (ref 0.0–30.0)
Microalb, Ur: 3.1 mg/dL — ABNORMAL HIGH (ref 0.00–1.89)

## 2012-08-03 LAB — PTH, INTACT AND CALCIUM
Calcium, Total (PTH): 8.8 mg/dL (ref 8.4–10.5)
PTH: 35.8 pg/mL (ref 14.0–72.0)

## 2012-08-19 ENCOUNTER — Encounter (HOSPITAL_COMMUNITY): Payer: Self-pay

## 2012-08-19 SURGERY — Surgical Case
Anesthesia: *Unknown

## 2012-08-25 NOTE — Pre-Procedure Instructions (Signed)
Kristine Tesean Sugerman.  08/25/2012   Your procedure is scheduled on:  Wednesday, April 2nd   Report to [place] at [place].  Call this number if you have problems the morning of surgery: [phone]   Remember:   Do not eat food or drink liquids after midnight.    Take these medicines the morning of surgery with A SIP OF WATER: none   Do not wear jewelry, make-up or nail polish.  Do not wear lotions, powders, or perfumes,deodorant.  Do not shave 48 hours prior to surgery. Men may shave face and neck.  Do not bring valuables to the hospital.  Contacts, dentures or bridgework may not be worn into surgery.  Leave suitcase in the car. After surgery it may be brought to your room.  For patients admitted to the hospital, checkout time is 11:00 AM the day of discharge.   Patients discharged the day of surgery will not be allowed to drive home.   Special Instructions: Shower using CHG 2 nights before surgery and the night before surgery.  If you shower the day of surgery use CHG.  Use special wash - you have one bottle of CHG for all showers.  You should use approximately 1/3 of the bottle for each shower.   Please read over the following fact sheets that you were given: Pain Booklet, Coughing and Deep Breathing, MRSA Information and Surgical Site Infection Prevention

## 2012-08-26 ENCOUNTER — Encounter (HOSPITAL_COMMUNITY)
Admission: RE | Admit: 2012-08-26 | Discharge: 2012-08-26 | Disposition: A | Discharge status: Home / Self Care | Payer: Medicaid Other | Source: Ambulatory Visit | Attending: Surgery | Admitting: Surgery

## 2012-08-26 ENCOUNTER — Encounter (HOSPITAL_COMMUNITY): Payer: Self-pay

## 2012-08-26 HISTORY — DX: Chronic kidney disease, unspecified: N18.9

## 2012-08-26 LAB — CBC
HCT: 28.9 % — ABNORMAL LOW (ref 39.0–52.0)
Hemoglobin: 10.5 g/dL — ABNORMAL LOW (ref 13.0–17.0)
MCH: 26.9 pg (ref 26.0–34.0)
MCHC: 36.3 g/dL — ABNORMAL HIGH (ref 30.0–36.0)
MCV: 73.9 fL — ABNORMAL LOW (ref 78.0–100.0)
Platelets: 182 10*3/uL (ref 150–400)
RBC: 3.91 MIL/uL — ABNORMAL LOW (ref 4.22–5.81)
RDW: 15.4 % (ref 11.5–15.5)
WBC: 6.7 10*3/uL (ref 4.0–10.5)

## 2012-08-26 LAB — COMPREHENSIVE METABOLIC PANEL
ALT: 37 U/L (ref 0–53)
AST: 37 U/L (ref 0–37)
Albumin: 3.6 g/dL (ref 3.5–5.2)
Alkaline Phosphatase: 81 U/L (ref 39–117)
BUN: 29 mg/dL — ABNORMAL HIGH (ref 6–23)
CO2: 18 mEq/L — ABNORMAL LOW (ref 19–32)
Calcium: 8.9 mg/dL (ref 8.4–10.5)
Chloride: 113 mEq/L — ABNORMAL HIGH (ref 96–112)
Creatinine, Ser: 2.45 mg/dL — ABNORMAL HIGH (ref 0.50–1.35)
GFR calc Af Amer: 31 mL/min — ABNORMAL LOW (ref >90)
GFR calc non Af Amer: 27 mL/min — ABNORMAL LOW (ref >90)
Glucose, Bld: 99 mg/dL (ref 70–99)
Potassium: 5.2 mEq/L — ABNORMAL HIGH (ref 3.5–5.1)
Sodium: 140 mEq/L (ref 135–145)
Total Bilirubin: 0.5 mg/dL (ref 0.3–1.2)
Total Protein: 7.5 g/dL (ref 6.0–8.3)

## 2012-08-26 LAB — SURGICAL PCR SCREEN
MRSA, PCR: NEGATIVE
Staphylococcus aureus: NEGATIVE

## 2012-08-26 NOTE — Progress Notes (Addendum)
Pat had sleep study in 2009...however, he doesn't wear the mask nor know what the settings were..  Plus, he's 'labeled' as diabetic, but he now takes no meds for it...Marland KitchenMarland KitchenDA  Pt just came back to office....stated that nephrology office called and told him he was anemic...and that they were calling in a script for him.  Even tho, he is taking iron pill, this new script may be at a higher dose and to start taking it as his previous hgb was 10.0 (in 06/30/2012)

## 2012-08-27 ENCOUNTER — Encounter (HOSPITAL_COMMUNITY): Payer: Self-pay

## 2012-08-27 NOTE — Progress Notes (Addendum)
Anesthesia chart review: Patient is a 62 year old male scheduled for ileostomy reversal by Dr. [name] on 09/02/12.  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  Other history includes smoking, obesity, DM2, HTN, HLD, OSA, murmur (not specified).  EKG on 04/13/12 showed SR with borderline inferior Q waves. These have been present since at least 02/16/12, but was primarily limited to lead III on his 01/30/12 EKG.  (I reviewed EKGs dating back to 01/30/12 with Anesthesiologist Dr. Tamala Julian with no new recommendations given.)  No CV symptoms were documented at his PAT appointment.  He has no known MI history and had a negative troponin on 02/24/12 and 04/13/12.  CXR on 04/13/12 showed no active cardiopulmonary disease.  Preoperative labs noted.  K+ 5.2, BUN/Cr 29/2.45.  (Last currently available was Cr 2.16 on 07/31/12.) H/H 10.5/28.9.  According to Dr. Vonna Kotyk last office note, he was having patient seen by nephrology pre-operatively.  I have requested records from Dr. Jimmy Footman and will review once available.  George Hugh [facility] Short Stay Center/Anesthesiology Phone [phone] 08/27/2012 3:32 PM  Addendum: 08/31/12 1115 I reviewed renal records from Dr. Jimmy Footman.  He last saw him on 08/13/12 and is aware of planned procedure.  His Cr then was 2.17, down from 3.26.  He has recommended patient avoid ARBs, ACEIs, NSAIDS at this time.

## 2012-09-01 MED ORDER — SODIUM CHLORIDE 0.9 % IV SOLN
1.0000 g | INTRAVENOUS | Status: AC
  Administered 2012-09-02: 1 g via INTRAVENOUS
  Filled 2012-09-01: qty 1

## 2012-09-02 ENCOUNTER — Encounter (HOSPITAL_COMMUNITY)
Admission: RE | Disposition: A | Discharge status: Home Health | Payer: Self-pay | Source: Ambulatory Visit | Attending: Surgery

## 2012-09-02 ENCOUNTER — Inpatient Hospital Stay (HOSPITAL_COMMUNITY): Payer: Medicaid Other | Admitting: Anesthesiology

## 2012-09-02 ENCOUNTER — Encounter (HOSPITAL_COMMUNITY): Payer: Self-pay | Admitting: Vascular Surgery

## 2012-09-02 ENCOUNTER — Inpatient Hospital Stay (HOSPITAL_COMMUNITY)
Admission: RE | Admit: 2012-09-02 | Discharge: 2012-09-08 | DRG: 336 | Disposition: A | Discharge status: Home Health | Payer: Medicaid Other | Source: Ambulatory Visit | Attending: Surgery | Admitting: Surgery

## 2012-09-02 ENCOUNTER — Encounter (HOSPITAL_COMMUNITY): Payer: Self-pay | Admitting: *Deleted

## 2012-09-02 DIAGNOSIS — K660 Peritoneal adhesions (postprocedural) (postinfection): Secondary | ICD-10-CM | POA: Diagnosis present

## 2012-09-02 DIAGNOSIS — M129 Arthropathy, unspecified: Secondary | ICD-10-CM | POA: Diagnosis present

## 2012-09-02 DIAGNOSIS — Z860100 Personal history of colon polyps, unspecified: Secondary | ICD-10-CM

## 2012-09-02 DIAGNOSIS — Z432 Encounter for attention to ileostomy: Principal | ICD-10-CM

## 2012-09-02 DIAGNOSIS — R110 Nausea: Secondary | ICD-10-CM | POA: Diagnosis present

## 2012-09-02 DIAGNOSIS — C189 Malignant neoplasm of colon, unspecified: Secondary | ICD-10-CM

## 2012-09-02 DIAGNOSIS — G4730 Sleep apnea, unspecified: Secondary | ICD-10-CM | POA: Diagnosis present

## 2012-09-02 DIAGNOSIS — E785 Hyperlipidemia, unspecified: Secondary | ICD-10-CM | POA: Diagnosis present

## 2012-09-02 DIAGNOSIS — IMO0002 Reserved for concepts with insufficient information to code with codable children: Secondary | ICD-10-CM | POA: Diagnosis not present

## 2012-09-02 DIAGNOSIS — Y832 Surgical operation with anastomosis, bypass or graft as the cause of abnormal reaction of the patient, or of later complication, without mention of misadventure at the time of the procedure: Secondary | ICD-10-CM | POA: Diagnosis not present

## 2012-09-02 DIAGNOSIS — N189 Chronic kidney disease, unspecified: Secondary | ICD-10-CM | POA: Diagnosis present

## 2012-09-02 DIAGNOSIS — I129 Hypertensive chronic kidney disease with stage 1 through stage 4 chronic kidney disease, or unspecified chronic kidney disease: Secondary | ICD-10-CM | POA: Diagnosis present

## 2012-09-02 DIAGNOSIS — Z9049 Acquired absence of other specified parts of digestive tract: Secondary | ICD-10-CM

## 2012-09-02 DIAGNOSIS — F17200 Nicotine dependence, unspecified, uncomplicated: Secondary | ICD-10-CM | POA: Diagnosis present

## 2012-09-02 DIAGNOSIS — Z79899 Other long term (current) drug therapy: Secondary | ICD-10-CM

## 2012-09-02 DIAGNOSIS — Z85038 Personal history of other malignant neoplasm of large intestine: Secondary | ICD-10-CM

## 2012-09-02 DIAGNOSIS — Z794 Long term (current) use of insulin: Secondary | ICD-10-CM

## 2012-09-02 DIAGNOSIS — E119 Type 2 diabetes mellitus without complications: Secondary | ICD-10-CM | POA: Diagnosis present

## 2012-09-02 HISTORY — PX: ILEOSTOMY CLOSURE: SHX1784

## 2012-09-02 LAB — POCT I-STAT 4, (NA,K, GLUC, HGB,HCT)
Glucose, Bld: 94 mg/dL (ref 70–99)
HCT: 34 % — ABNORMAL LOW (ref 39.0–52.0)
Hemoglobin: 11.6 g/dL — ABNORMAL LOW (ref 13.0–17.0)
Potassium: 4.2 mEq/L (ref 3.5–5.1)
Sodium: 141 mEq/L (ref 135–145)

## 2012-09-02 SURGERY — CLOSURE, ILEOSTOMY
Anesthesia: General | Site: Abdomen | Wound class: Clean Contaminated

## 2012-09-02 MED ORDER — ONDANSETRON HCL 4 MG/2ML IJ SOLN: 4.0000 mg | Freq: Four times a day (QID) | INTRAMUSCULAR | Status: DC | PRN

## 2012-09-02 MED ORDER — VECURONIUM BROMIDE 10 MG IV SOLR
INTRAVENOUS | Status: DC | PRN
  Administered 2012-09-02: 2 mg via INTRAVENOUS

## 2012-09-02 MED ORDER — MIDAZOLAM HCL 5 MG/5ML IJ SOLN
INTRAMUSCULAR | Status: DC | PRN
  Administered 2012-09-02: 2 mg via INTRAVENOUS

## 2012-09-02 MED ORDER — EPHEDRINE SULFATE 50 MG/ML IJ SOLN
INTRAMUSCULAR | Status: DC | PRN
  Administered 2012-09-02: 15 mg via INTRAVENOUS
  Administered 2012-09-02: 10 mg via INTRAVENOUS

## 2012-09-02 MED ORDER — SODIUM BICARBONATE 650 MG PO TABS
650.0000 mg | ORAL_TABLET | Freq: Two times a day (BID) | ORAL | Status: DC
  Administered 2012-09-02 – 2012-09-07 (×9): 650 mg via ORAL
  Filled 2012-09-02 (×14): qty 1

## 2012-09-02 MED ORDER — LACTATED RINGERS IV SOLN
INTRAVENOUS | Status: DC | PRN
  Administered 2012-09-02 (×2): via INTRAVENOUS

## 2012-09-02 MED ORDER — 0.9 % SODIUM CHLORIDE (POUR BTL) OPTIME
TOPICAL | Status: DC | PRN
  Administered 2012-09-02 (×6): 1000 mL

## 2012-09-02 MED ORDER — FENTANYL CITRATE 0.05 MG/ML IJ SOLN
INTRAMUSCULAR | Status: DC | PRN
  Administered 2012-09-02 (×2): 50 ug via INTRAVENOUS
  Administered 2012-09-02: 100 ug via INTRAVENOUS

## 2012-09-02 MED ORDER — ATORVASTATIN CALCIUM 20 MG PO TABS
20.0000 mg | ORAL_TABLET | Freq: Every day | ORAL | Status: DC
  Administered 2012-09-02 – 2012-09-07 (×5): 20 mg via ORAL
  Filled 2012-09-02 (×7): qty 1

## 2012-09-02 MED ORDER — SODIUM CHLORIDE 0.9 % IV SOLN
1.0000 g | INTRAVENOUS | Status: AC
  Administered 2012-09-03: 1 g via INTRAVENOUS
  Filled 2012-09-02: qty 1

## 2012-09-02 MED ORDER — HYDROMORPHONE 0.3 MG/ML IV SOLN
INTRAVENOUS | Status: AC
  Filled 2012-09-02: qty 25

## 2012-09-02 MED ORDER — DIPHENHYDRAMINE HCL 12.5 MG/5ML PO ELIX: 12.5000 mg | ORAL_SOLUTION | Freq: Four times a day (QID) | ORAL | Status: DC | PRN

## 2012-09-02 MED ORDER — SODIUM CHLORIDE 0.9 % IV SOLN
INTRAVENOUS | Status: DC
  Administered 2012-09-02 – 2012-09-04 (×4): via INTRAVENOUS
  Administered 2012-09-04: 100 mL/h via INTRAVENOUS
  Administered 2012-09-05 – 2012-09-06 (×5): via INTRAVENOUS

## 2012-09-02 MED ORDER — ONDANSETRON HCL 4 MG PO TABS
4.0000 mg | ORAL_TABLET | Freq: Four times a day (QID) | ORAL | Status: DC | PRN
  Administered 2012-09-04 (×2): 4 mg via ORAL
  Filled 2012-09-02 (×2): qty 1

## 2012-09-02 MED ORDER — ROCURONIUM BROMIDE 100 MG/10ML IV SOLN
INTRAVENOUS | Status: DC | PRN
  Administered 2012-09-02: 50 mg via INTRAVENOUS

## 2012-09-02 MED ORDER — HYDROMORPHONE HCL PF 1 MG/ML IJ SOLN
INTRAMUSCULAR | Status: AC
  Administered 2012-09-02: 0.5 mg
  Filled 2012-09-02: qty 2

## 2012-09-02 MED ORDER — NEOSTIGMINE METHYLSULFATE 1 MG/ML IJ SOLN
INTRAMUSCULAR | Status: DC | PRN
  Administered 2012-09-02: 4 mg via INTRAVENOUS

## 2012-09-02 MED ORDER — ENOXAPARIN SODIUM 30 MG/0.3ML ~~LOC~~ SOLN
30.0000 mg | SUBCUTANEOUS | Status: DC
Start: 2012-09-02 — End: 2012-09-04
  Administered 2012-09-02 – 2012-09-03 (×2): 30 mg via SUBCUTANEOUS
  Filled 2012-09-02 (×3): qty 0.3

## 2012-09-02 MED ORDER — NALOXONE HCL 0.4 MG/ML IJ SOLN: 0.4000 mg | INTRAMUSCULAR | Status: DC | PRN

## 2012-09-02 MED ORDER — LIDOCAINE HCL (CARDIAC) 20 MG/ML IV SOLN
INTRAVENOUS | Status: DC | PRN
  Administered 2012-09-02: 50 mg via INTRAVENOUS

## 2012-09-02 MED ORDER — ONDANSETRON HCL 4 MG/2ML IJ SOLN
INTRAMUSCULAR | Status: DC | PRN
  Administered 2012-09-02: 4 mg via INTRAVENOUS

## 2012-09-02 MED ORDER — SODIUM CHLORIDE 0.9 % IJ SOLN: 9.0000 mL | INTRAMUSCULAR | Status: DC | PRN

## 2012-09-02 MED ORDER — GLYCOPYRROLATE 0.2 MG/ML IJ SOLN
INTRAMUSCULAR | Status: DC | PRN
  Administered 2012-09-02: .8 mg via INTRAVENOUS

## 2012-09-02 MED ORDER — CHLORHEXIDINE GLUCONATE 4 % EX LIQD: 1.0000 "application " | Freq: Once | CUTANEOUS | Status: DC

## 2012-09-02 MED ORDER — HYDROMORPHONE HCL PF 1 MG/ML IJ SOLN
0.5000 mg | INTRAMUSCULAR | Status: DC | PRN
  Administered 2012-09-02 (×2): 0.5 mg via INTRAVENOUS

## 2012-09-02 MED ORDER — ONDANSETRON HCL 4 MG/2ML IJ SOLN
4.0000 mg | Freq: Four times a day (QID) | INTRAMUSCULAR | Status: DC | PRN
  Administered 2012-09-05: 4 mg via INTRAVENOUS
  Filled 2012-09-02: qty 2

## 2012-09-02 MED ORDER — DIPHENHYDRAMINE HCL 50 MG/ML IJ SOLN: 12.5000 mg | Freq: Four times a day (QID) | INTRAMUSCULAR | Status: DC | PRN

## 2012-09-02 MED ORDER — HYDROMORPHONE 0.3 MG/ML IV SOLN
INTRAVENOUS | Status: DC
  Administered 2012-09-02: 2.7 mg via INTRAVENOUS
  Administered 2012-09-02: 0.3 mg via INTRAVENOUS
  Administered 2012-09-02: 1.3 mg via INTRAVENOUS
  Administered 2012-09-02: 25 mL via INTRAVENOUS
  Administered 2012-09-03: 4 mL via INTRAVENOUS
  Administered 2012-09-03: 7 mL via INTRAVENOUS
  Administered 2012-09-03: 0.6 mg via INTRAVENOUS
  Administered 2012-09-03: 3 mg via INTRAVENOUS
  Administered 2012-09-03: 16:00:00 via INTRAVENOUS
  Administered 2012-09-03: 1.5 mg via INTRAVENOUS
  Administered 2012-09-04: 0.6 mg via INTRAVENOUS
  Administered 2012-09-04 (×2): 1.5 mg via INTRAVENOUS
  Administered 2012-09-04: 0.6 mg via INTRAVENOUS
  Administered 2012-09-04: 0.3 mL via INTRAVENOUS
  Filled 2012-09-02 (×2): qty 25

## 2012-09-02 MED ORDER — PROPOFOL 10 MG/ML IV BOLUS
INTRAVENOUS | Status: DC | PRN
  Administered 2012-09-02: 200 mg via INTRAVENOUS

## 2012-09-02 MED ORDER — ARTIFICIAL TEARS OP OINT
TOPICAL_OINTMENT | OPHTHALMIC | Status: DC | PRN
  Administered 2012-09-02: 1 via OPHTHALMIC

## 2012-09-02 SURGICAL SUPPLY — 43 items
BLADE SURG ROTATE 9660 (MISCELLANEOUS) ×1 IMPLANT
CANISTER SUCTION 2500CC (MISCELLANEOUS) ×4 IMPLANT
CHLORAPREP W/TINT 26ML (MISCELLANEOUS) ×2 IMPLANT
CLOTH BEACON ORANGE TIMEOUT ST (SAFETY) ×2 IMPLANT
DRAPE LAPAROSCOPIC ABDOMINAL (DRAPES) ×2 IMPLANT
DRAPE UTILITY 15X26 W/TAPE STR (DRAPE) ×6 IMPLANT
DRAPE WARM FLUID 44X44 (DRAPE) ×2 IMPLANT
DRSG OPSITE POSTOP 4X10 (GAUZE/BANDAGES/DRESSINGS) ×1 IMPLANT
ELECT BLADE 6.5 EXT (BLADE) IMPLANT
ELECT CAUTERY BLADE 6.4 (BLADE) ×2 IMPLANT
ELECT REM PT RETURN 9FT ADLT (ELECTROSURGICAL) ×2
ELECTRODE REM PT RTRN 9FT ADLT (ELECTROSURGICAL) ×1 IMPLANT
GLOVE BIO SURGEON STRL SZ7 (GLOVE) ×5 IMPLANT
GLOVE BIOGEL PI IND STRL 7.0 (GLOVE) IMPLANT
GLOVE BIOGEL PI IND STRL 7.5 (GLOVE) ×1 IMPLANT
GLOVE BIOGEL PI INDICATOR 7.0 (GLOVE) ×2
GLOVE BIOGEL PI INDICATOR 7.5 (GLOVE) ×5
GLOVE SURG SS PI 7.0 STRL IVOR (GLOVE) ×4 IMPLANT
GOWN STRL NON-REIN LRG LVL3 (GOWN DISPOSABLE) ×8 IMPLANT
KIT BASIN OR (CUSTOM PROCEDURE TRAY) ×2 IMPLANT
KIT ROOM TURNOVER OR (KITS) ×2 IMPLANT
LIGASURE IMPACT 36 18CM CVD LR (INSTRUMENTS) IMPLANT
NS IRRIG 1000ML POUR BTL (IV SOLUTION) ×8 IMPLANT
PACK GENERAL/GYN (CUSTOM PROCEDURE TRAY) ×2 IMPLANT
PAD ARMBOARD 7.5X6 YLW CONV (MISCELLANEOUS) ×4 IMPLANT
RELOAD PROXIMATE 75MM BLUE (ENDOMECHANICALS) ×4 IMPLANT
SPECIMEN JAR MEDIUM (MISCELLANEOUS) ×2 IMPLANT
SPONGE GAUZE 4X4 12PLY (GAUZE/BANDAGES/DRESSINGS) ×2 IMPLANT
SPONGE LAP 18X18 X RAY DECT (DISPOSABLE) ×2 IMPLANT
STAPLER GUN LINEAR PROX 60 (STAPLE) ×1 IMPLANT
STAPLER PROXIMATE 75MM BLUE (STAPLE) ×2 IMPLANT
STAPLER VISISTAT 35W (STAPLE) ×2 IMPLANT
SUCTION POOLE TIP (SUCTIONS) ×2 IMPLANT
SUT NOVA NAB DX-16 0-1 5-0 T12 (SUTURE) ×8 IMPLANT
SUT PDS AB 1 CTX 36 (SUTURE) ×2 IMPLANT
SUT SILK 2 0 SH CR/8 (SUTURE) ×2 IMPLANT
SUT SILK 2 0 TIES 10X30 (SUTURE) ×2 IMPLANT
SUT SILK 3 0 SH CR/8 (SUTURE) ×3 IMPLANT
SUT SILK 3 0 TIES 10X30 (SUTURE) ×2 IMPLANT
TOWEL OR 17X26 10 PK STRL BLUE (TOWEL DISPOSABLE) ×2 IMPLANT
TRAY FOLEY CATH 14FR (SET/KITS/TRAYS/PACK) ×2 IMPLANT
WATER STERILE IRR 1000ML POUR (IV SOLUTION) IMPLANT
YANKAUER SUCT BULB TIP NO VENT (SUCTIONS) ×2 IMPLANT

## 2012-09-02 NOTE — Progress Notes (Signed)
Orthopedic Tech Progress Note Patient Details:  [name]. November 09, 1950 [id number]  Ortho Devices Type of Ortho Device: Abdominal binder Ortho Device/Splint Location: abdomen Ortho Device/Splint Interventions: Ordered rn to apply  Hildred Priest 09/02/2012, 2:22 PM

## 2012-09-02 NOTE — Transfer of Care (Signed)
Immediate Anesthesia Transfer of Care Note  Patient: [name].  Procedure(s) Performed: Procedure(s): ILEOSTOMY REVERSAL (N/A)  Patient Location: PACU  Anesthesia Type:General  Level of Consciousness: awake, alert  and oriented  Airway & Oxygen Therapy: Patient Spontanous Breathing and Patient connected to face mask oxygen  Post-op Assessment: Report given to PACU RN, Post -op Vital signs reviewed and stable and Patient moving all extremities X 4  Post vital signs: Reviewed and stable  Complications: No apparent anesthesia complications

## 2012-09-02 NOTE — Anesthesia Procedure Notes (Signed)
Procedure Name: Intubation Date/Time: 09/02/2012 8:55 AM Performed by: Neldon Newport Pre-anesthesia Checklist: Timeout performed, Patient identified, Emergency Drugs available, Suction available and Patient being monitored Patient Re-evaluated:Patient Re-evaluated prior to inductionOxygen Delivery Method: Circle system utilized Preoxygenation: Pre-oxygenation with 100% oxygen Intubation Type: IV induction Ventilation: Mask ventilation without difficulty Laryngoscope Size: Mac and 4 Grade View: Grade I Tube type: Oral Tube size: 7.5 mm Number of attempts: 1 Placement Confirmation: positive ETCO2,  ETT inserted through vocal cords under direct vision and breath sounds checked- equal and bilateral Secured at: 23 cm Tube secured with: Tape Dental Injury: Teeth and Oropharynx as per pre-operative assessment

## 2012-09-02 NOTE — Op Note (Signed)
Preop diagnosis: Ileostomy after intra-abdominal sepsis Postop diagnosis: Same Procedure performed: Exploratory laparotomy with lysis of adhesions (60 minutes) Ileostomy reversal Surgeon:Christpher Stogsdill K. Assistant: Dr. Rolm Bookbinder Anesthesia: Gen. Endotracheal Indications: This is a 62 year old male who is status post colon resection in September 2013. He had a delayed leak at his anastomosis and had intra-abdominal sepsis with an open abdomen for several days. He has had problems with renal failure and electrolyte imbalances. He is now stable for reversal of his ileostomy.  Description of procedure: The patient was brought to the operating room and placed in a supine position on the operating room table. After adequate level of general anesthesia was obtained the patient's abdomen was prepped with chlor prep and draped in sterile fashion. His ileostomy had been closed with a 2-0 silk pursestring and prepped with Betadine. A timeout was taken to ensure the proper patient proper procedure. We excised his old scar with cautery. We carried our dissection down to the fascia. We entered the fascia and bluntly dissected some of the adhesions away. The omentum is densely adherent to the anterior abdominal wall on the left. We were able to meticulously dissect along the left posterior rectus sheath. We were finally able to enter the peritoneal cavity and examined the small bowel. The adhesions were taken down the right side with less difficulty. We were able to quickly located our Hartman's pouch in the mid transverse colon. This is fairly mobile. We dissect some omental adhesions away from this staple line. We then dissected out laterally on the right until we were around the ileostomy. An elliptical incision was used to excise the ileostomy at the skin. We dissected down into the subcutaneous tissues with cautery. We dissected the ileostomy free from the surrounding fascia and reduce his back in the  peritoneal space. The ileostomy was then mobilized and was felt to easily reach the Hartman's pouch without difficulty. We used a GIA stapler to amputate the distal 10 cm of small bowel at the ileostomy. We then created a side-to-side anastomosis between the terminal ileum and the transverse colon. The staple line was inspected and minimal oozing was noted. The common defect was closed with a TA 60 stapler. We placed 2 reinforcing crotch sutures at the staple line. We then oversewed the common enterotomy TA staple line with multiple 3-0 silk sutures. We thoroughly irrigated the abdomen and inspected for hemostasis. There is some generalized oozing but no surgical bleeding. We closed the fascia of the ileostomy site with #1 Novafil figure-of-eight sutures. We placed an anterior row of sutures in the anterior fascia at the ileostomy site. The midline fascia was then closed with figure-of-eight #1 Novafil sutures. The subcutaneous tissues were irrigated and staples were used to close the skin. The ileostomy site was packed with gauze. The patient was then extubated and brought to the recovery room in stable condition. All sponge, initially, and needle counts are correct.  Imogene Burn. [name], MD, [facility]  09/02/2012 11:04 AM

## 2012-09-02 NOTE — Anesthesia Preprocedure Evaluation (Addendum)
Anesthesia Evaluation  Patient identified by MRN, date of birth, ID band Patient awake    Reviewed: Allergy & Precautions, H&P , NPO status , Patient's Chart, lab work & pertinent test results  Airway Mallampati: II  Neck ROM: full    Dental  (+) Edentulous Upper and Dental Advidsory Given   Pulmonary sleep apnea , Current Smoker,          Cardiovascular hypertension,     Neuro/Psych PSYCHIATRIC DISORDERS    GI/Hepatic   Endo/Other  diabetes, Type 2obese  Renal/GU Renal InsufficiencyRenal disease     Musculoskeletal  (+) Arthritis -,   Abdominal   Peds  Hematology   Anesthesia Other Findings   Reproductive/Obstetrics                          Anesthesia Physical Anesthesia Plan  ASA: III  Anesthesia Plan: General   Post-op Pain Management:    Induction: Intravenous  Airway Management Planned: Oral ETT  Additional Equipment:   Intra-op Plan:   Post-operative Plan: Extubation in OR  Informed Consent: I have reviewed the patients History and Physical, chart, labs and discussed the procedure including the risks, benefits and alternatives for the proposed anesthesia with the patient or authorized representative who has indicated his/her understanding and acceptance.   Dental Advisory Given  Plan Discussed with: CRNA and Surgeon  Anesthesia Plan Comments:        Anesthesia Quick Evaluation

## 2012-09-02 NOTE — H&P (Signed)
HPI  [name] is a 62 y.o. male.  HPI  This is a postoperative visit for a 62 year old male who has had a very complicated postoperative course. On 02/06/12 he underwent a laparoscopic assisted right hemicolectomy. This was for adenocarcinoma T3 N0. The patient was discharged home several days later in good health. However he came in in septic shock several days later and underwent exploration. He was noted to have a leak at his anastomosis. He underwent resection of his anastomosis. Subsequently his abdomen was closed several days later with an end ileostomy and a long Hartman's pouch reaching the transverse colon. Since that time he has had at least 2 hospital admissions for renal failure and severe dehydration due to to ileostomy output. His ileostomy output is now under control with cholestyramine Imodium and aggressive oral hydration. His renal function remains abnormal. He has been referred to Renal by his PCP. His wound is almost completely healed. He has good appetite and is maintaining good hydration.  Past Medical History   Diagnosis  Date   .  Hyperlipidemia    .  Hypertension      for a few years and resolved in 2013/2014.   Marland Kitchen  Heart murmur    .  Diabetes mellitus      type 2 IDDM x 15 years   .  Sleep apnea      does not wear CPAP now   .  Colon polyp    .  Arthritis      left knee   .  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  12/25/2011    Past Surgical History   Procedure  Laterality  Date   .  Colonoscopy     .  Ganglion cyst excision   20 YRS AGO     RT ARM   .  Partial colectomy   02/06/2012   .  Partial colectomy   02/06/2012     Procedure: PARTIAL COLECTOMY; Surgeon: Imogene Burn. [name], MD; Location: [place]; Service: General; Laterality: N/A; right partial colectomy   .  Colonoscopy   02/06/2012     Procedure: COLONOSCOPY; Surgeon: Milus Banister, MD; Location: [place]; Service: Endoscopy;;   .  Laparotomy   02/16/2012     Procedure: EXPLORATORY LAPAROTOMY;  Surgeon: Zenovia Jarred, MD; Location: [place]; Service: General; Laterality: N/A; Exploratory Laparotomy with resection of anastomosis   .  Ileostomy   02/16/2012     Procedure: ILEOSTOMY; Surgeon: Zenovia Jarred, MD; Location: [place]; Service: General; Laterality: N/A;   .  Application of wound vac   02/16/2012     Procedure: APPLICATION OF WOUND VAC; Surgeon: Zenovia Jarred, MD; Location: [place]; Service: General; Laterality: N/A;   .  Laparotomy   02/18/2012     Procedure: EXPLORATORY LAPAROTOMY; Surgeon: Odis Hollingshead, MD; Location: [place]; Service: General; Laterality: N/A; exploratory laparotomy change of abdominal vac dressing   .  Dressing change under anesthesia   02/18/2012     Procedure: DRESSING CHANGE UNDER ANESTHESIA; Surgeon: Odis Hollingshead, MD; Location: [place]; Service: General; Laterality: N/A;   .  Laparotomy   02/20/2012     Procedure: EXPLORATORY LAPAROTOMY; Surgeon: Odis Hollingshead, MD; Location: [place]; Service: General; Laterality: N/A;   .  Vacuum assisted closure change   02/20/2012     Procedure: ABDOMINAL VACUUM ASSISTED CLOSURE CHANGE; Surgeon: Odis Hollingshead, MD; Location: [place]; Service: General;;   .  Laparotomy  02/23/2012     Procedure: EXPLORATORY LAPAROTOMY; Surgeon: Joyice Faster. Cornett, MD; Location: [place]; Service: General; Laterality: N/A; Irrigation and Debridement of abdominal wound with wound vac change with partial closure   .  Incision and drainage of wound   02/23/2012     Procedure: IRRIGATION AND DEBRIDEMENT WOUND; Surgeon: Joyice Faster. Cornett, MD; Location: [place]; Service: General; Laterality: N/A;   .  Vacuum assisted closure change   02/23/2012     Procedure: ABDOMINAL VACUUM ASSISTED CLOSURE CHANGE; Surgeon: Joyice Faster. Cornett, MD; Location: [place]; Service: General; Laterality: N/A;   .  Laparotomy   02/26/2012     Procedure: EXPLORATORY LAPAROTOMY; Surgeon: Imogene Burn. [name], MD; Location: [place]; Service: General; Laterality: N/A;   .  Application  of wound vac   02/26/2012     Procedure: APPLICATION OF WOUND VAC; Surgeon: Imogene Burn. [name], MD; Location: [facility]; Service: General; Laterality: N/A;    Family History   Problem  Relation  Age of Onset   .  Diabetes  Father    .  Heart disease  Father    .  Colon cancer  Neg Hx    .  Diabetes  Sister    .  Heart disease  Sister    .  Heart disease  Mother    Social History  History   Substance Use Topics   .  Smoking status:  Current Every Day Smoker -- 0.30 packs/day for 30 years     Types:  Cigarettes   .  Smokeless tobacco:  Never Used   .  Alcohol Use:  Yes      Comment: Liquor twice monthly.   No Known Allergies  Current Outpatient Prescriptions   Medication  Sig  Dispense  Refill   .  Ascorbic Acid (VITAMIN C) 100 MG tablet  Take 100 mg by mouth daily.     Marland Kitchen  atorvastatin (LIPITOR) 20 MG tablet  Take 1 tablet (20 mg total) by mouth at bedtime.  90 tablet  1   .  ferrous sulfate 325 (65 FE) MG EC tablet  Take 1 tablet (325 mg total) by mouth 2 (two) times daily.  120 tablet  0   .  loperamide (IMODIUM) 2 MG capsule  Take 1 capsule (2 mg total) by mouth 4 (four) times daily as needed for diarrhea or loose stools (for very loose ileostomy output; output should be thicker (pancake batter consistency) not watery).  30 capsule  3    No current facility-administered medications for this visit.   Review of Systems  Review of Systems  Constitutional: Negative for fever, chills and unexpected weight change.  HENT: Negative for hearing loss, congestion, sore throat, trouble swallowing and voice change.  Eyes: Negative for visual disturbance.  Respiratory: Negative for cough and wheezing.  Cardiovascular: Negative for chest pain, palpitations and leg swelling.  Gastrointestinal: Positive for diarrhea and abdominal distention. Negative for nausea, vomiting, abdominal pain, constipation, blood in stool, anal bleeding and rectal pain.  Genitourinary: Negative for hematuria and difficulty  urinating.  Musculoskeletal: Negative for arthralgias.  Skin: Negative for rash and wound.  Neurological: Negative for seizures, syncope, weakness and headaches.  Hematological: Negative for adenopathy. Does not bruise/bleed easily.  Psychiatric/Behavioral: Negative for confusion.  Blood pressure 132/68, pulse 72, temperature 98.2 F (36.8 C), temperature source Oral, resp. rate 16, height 5' 11.5" (1.816 m), weight 218 lb 12.8 oz (99.247 kg).  Physical Exam  Physical Exam  WDWN in NAD  HEENT: EOMI, sclera anicteric  Neck: No masses, no thyromegaly  Lungs: CTA bilaterally; normal respiratory effort  CV: Regular rate and rhythm; no murmurs  Abd: +bowel sounds, soft, wound - 2 x 6 cm area of granulation tissue; RLQ ileostomy - no prolapse or peristomal hernia.  Ext: Well-perfused; no edema  Skin: Warm, dry; no sign of jaundice  Data Reviewed  Lab Results   Component  Value  Date    WBC  7.5  06/30/2012    HGB  10.0*  06/30/2012    HCT  29.0*  06/30/2012    MCV  78.6*  06/30/2012    PLT  201  06/30/2012    Lab Results   Component  Value  Date    CREATININE  3.26*  06/24/2012    BUN  43*  06/24/2012    NA  134*  06/24/2012    K  5.3  06/24/2012    CL  115*  06/24/2012    CO2  14*  06/24/2012   Assessment  1. Chronic renal dysfunction  2. 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 Jimmy Footman  Plan ileostomy reversal/ lysis of adhesions/ ileocolic anastomosis. The surgical procedure has been discussed with the patient. Potential risks, benefits, alternative treatments, and expected outcomes have been explained. All of the patient's questions at this time have been answered. The likelihood of reaching the patient's treatment goal is good. The patient understand the proposed surgical procedure and wishes to proceed.  Imogene Burn. [name], MD, [facility] Surgery  09/02/2012 7:45 AM

## 2012-09-02 NOTE — Progress Notes (Signed)
Rec'd report from sharon RN, assuming care for lunch relief

## 2012-09-02 NOTE — Preoperative (Signed)
Beta Blockers   Reason not to administer Beta Blockers:Not Applicable 

## 2012-09-02 NOTE — Anesthesia Postprocedure Evaluation (Signed)
Anesthesia Post Note  Patient: [name].  Procedure(s) Performed: Procedure(s) (LRB): ILEOSTOMY REVERSAL (N/A)  Anesthesia type: General  Patient location: PACU  Post pain: Pain level controlled and Adequate analgesia  Post assessment: Post-op Vital signs reviewed, Patient's Cardiovascular Status Stable, Respiratory Function Stable, Patent Airway and Pain level controlled  Last Vitals:  Filed Vitals:   09/02/12 1132  BP:   Pulse:   Temp:   Resp: 22    Post vital signs: Reviewed and stable  Level of consciousness: awake, alert  and oriented  Complications: No apparent anesthesia complications

## 2012-09-03 LAB — CBC
HCT: 28.1 % — ABNORMAL LOW (ref 39.0–52.0)
Hemoglobin: 10.1 g/dL — ABNORMAL LOW (ref 13.0–17.0)
MCH: 26.2 pg (ref 26.0–34.0)
MCHC: 35.9 g/dL (ref 30.0–36.0)
MCV: 73 fL — ABNORMAL LOW (ref 78.0–100.0)
Platelets: 159 10*3/uL (ref 150–400)
RBC: 3.85 MIL/uL — ABNORMAL LOW (ref 4.22–5.81)
RDW: 15.3 % (ref 11.5–15.5)
WBC: 12.9 10*3/uL — ABNORMAL HIGH (ref 4.0–10.5)

## 2012-09-03 LAB — BASIC METABOLIC PANEL
BUN: 27 mg/dL — ABNORMAL HIGH (ref 6–23)
CO2: 18 mEq/L — ABNORMAL LOW (ref 19–32)
Calcium: 8.1 mg/dL — ABNORMAL LOW (ref 8.4–10.5)
Chloride: 111 mEq/L (ref 96–112)
Creatinine, Ser: 2.24 mg/dL — ABNORMAL HIGH (ref 0.50–1.35)
GFR calc Af Amer: 35 mL/min — ABNORMAL LOW (ref >90)
GFR calc non Af Amer: 30 mL/min — ABNORMAL LOW (ref >90)
Glucose, Bld: 143 mg/dL — ABNORMAL HIGH (ref 70–99)
Potassium: 4.3 mEq/L (ref 3.5–5.1)
Sodium: 137 mEq/L (ref 135–145)

## 2012-09-03 MED ORDER — ACETAMINOPHEN 325 MG PO TABS: 650.0000 mg | ORAL_TABLET | ORAL | Status: DC | PRN

## 2012-09-03 NOTE — Progress Notes (Signed)
1 Day Post-Op  Subjective: Sore, but feeling better than yesterday No nausea - tolerating clears without difficulty  Objective: Vital signs in last 24 hours: Temp:  [96.6 F (35.9 C)-98.6 F (37 C)] 98.6 F (37 C) (04/03 0518) Pulse Rate:  [76-99] 90 (04/03 0518) Resp:  [15-28] 28 (04/03 0800) BP: (113-165)/(67-91) 130/72 mmHg (04/03 0518) SpO2:  [100 %] 100 % (04/03 0800) FiO2 (%):  [2 %] 2 % (04/02 1245) Weight:  [238 lb 8 oz (108.183 kg)] 238 lb 8 oz (108.183 kg) (04/02 1330) Last BM Date: 09/02/12  Intake/Output from previous day: 04/02 0701 - 04/03 0700 In: 3858.3 [P.O.:465; I.V.:3393.3] Out: 3150 [Urine:3100; Blood:50] Intake/Output this shift:    General appearance: alert, cooperative and no distress Resp: clear to auscultation bilaterally GI: occasional bowel sounds; some serosanguinous drainage on midline dressing RLQ wound - repacked - serosanguinous drainage  Lab Results:   Recent Labs  09/02/12 0644 09/03/12 0555  WBC  --  12.9*  HGB 11.6* 10.1*  HCT 34.0* 28.1*  PLT  --  159   BMET  Recent Labs  09/02/12 0644 09/03/12 0555  NA 141 137  K 4.2 4.3  CL  --  111  CO2  --  18*  GLUCOSE 94 143*  BUN  --  27*  CREATININE  --  2.24*  CALCIUM  --  8.1*   PT/INR No results found for this basename: LABPROT, INR,  in the last 72 hours ABG No results found for this basename: PHART, PCO2, PO2, HCO3,  in the last 72 hours  Studies/Results: No results found.  Anti-infectives: Anti-infectives   Start     Dose/Rate Route Frequency Ordered Stop   09/03/12 0600  ertapenem (INVANZ) 1 g in sodium chloride 0.9 % 50 mL IVPB     1 g 100 mL/hr over 30 Minutes Intravenous Every 24 hours 09/02/12 1337 09/03/12 0555   09/02/12 0600  ertapenem (INVANZ) 1 g in sodium chloride 0.9 % 50 mL IVPB     1 g 100 mL/hr over 30 Minutes Intravenous On call to O.R. 09/01/12 1422 09/02/12 0856      Assessment/Plan: s/p Procedure(s): ILEOSTOMY REVERSAL (N/A) Continue  foley due to strict I&O and urinary output monitoring Clear liquids Await return of bowel function Ambulate with assistance Continue dressing changes   LOS: 1 day    Edita Weyenberg K. 09/03/2012

## 2012-09-04 ENCOUNTER — Encounter (HOSPITAL_COMMUNITY): Payer: Self-pay | Admitting: Surgery

## 2012-09-04 LAB — CBC
HCT: 23.3 % — ABNORMAL LOW (ref 39.0–52.0)
Hemoglobin: 8.5 g/dL — ABNORMAL LOW (ref 13.0–17.0)
MCH: 26.6 pg (ref 26.0–34.0)
MCHC: 36.5 g/dL — ABNORMAL HIGH (ref 30.0–36.0)
MCV: 73 fL — ABNORMAL LOW (ref 78.0–100.0)
Platelets: 141 10*3/uL — ABNORMAL LOW (ref 150–400)
RBC: 3.19 MIL/uL — ABNORMAL LOW (ref 4.22–5.81)
RDW: 15.1 % (ref 11.5–15.5)
WBC: 10.7 10*3/uL — ABNORMAL HIGH (ref 4.0–10.5)

## 2012-09-04 LAB — BASIC METABOLIC PANEL
BUN: 30 mg/dL — ABNORMAL HIGH (ref 6–23)
CO2: 20 mEq/L (ref 19–32)
Calcium: 8.1 mg/dL — ABNORMAL LOW (ref 8.4–10.5)
Chloride: 108 mEq/L (ref 96–112)
Creatinine, Ser: 2.48 mg/dL — ABNORMAL HIGH (ref 0.50–1.35)
GFR calc Af Amer: 31 mL/min — ABNORMAL LOW (ref >90)
GFR calc non Af Amer: 26 mL/min — ABNORMAL LOW (ref >90)
Glucose, Bld: 113 mg/dL — ABNORMAL HIGH (ref 70–99)
Potassium: 4.2 mEq/L (ref 3.5–5.1)
Sodium: 135 mEq/L (ref 135–145)

## 2012-09-04 MED ORDER — SILVER NITRATE-POT NITRATE 75-25 % EX MISC
1.0000 "application " | Freq: Once | CUTANEOUS | Status: AC
  Administered 2012-09-04: 1 via TOPICAL
  Filled 2012-09-04 (×2): qty 1

## 2012-09-04 MED ORDER — ENOXAPARIN SODIUM 40 MG/0.4ML ~~LOC~~ SOLN: 40.0000 mg | SUBCUTANEOUS | Status: DC

## 2012-09-04 MED ORDER — SILVER NITRATE-POT NITRATE 75-25 % EX MISC
3.0000 | Freq: Once | CUTANEOUS | Status: AC
  Administered 2012-09-04: 3 via TOPICAL
  Filled 2012-09-04: qty 3

## 2012-09-04 MED ORDER — ENOXAPARIN SODIUM 40 MG/0.4ML ~~LOC~~ SOLN
40.0000 mg | SUBCUTANEOUS | Status: DC
  Administered 2012-09-04 – 2012-09-07 (×4): 40 mg via SUBCUTANEOUS
  Filled 2012-09-04 (×4): qty 0.4

## 2012-09-04 NOTE — Progress Notes (Signed)
Increased serosanguineous shadow drainage noted on patient's RLQ dressing throughout the night. Went in to change dressing and when the gauze packing was removed, patient began bleeding from incision site at about 2:00 on the face of the incision. MD on call, Grandville Silos made aware and orders given for silver nitrate sticks. Will continue to monitor patient and apply nitrate sticks as soon as possible.

## 2012-09-04 NOTE — Progress Notes (Signed)
2 Days Post-Op  Subjective: Patient sitting up in chair - has been ambulating No flatus or BM yet Foley is still despite being ordered out this AM No further bleeding from wound  Objective: Vital signs in last 24 hours: Temp:  [98.2 F (36.8 C)-100.8 F (38.2 C)] 99.1 F (37.3 C) (04/04 1015) Pulse Rate:  [88-104] 88 (04/04 1015) Resp:  [18-27] 18 (04/04 1223) BP: (108-145)/(62-87) 124/74 mmHg (04/04 1015) SpO2:  [96 %-100 %] 100 % (04/04 1223) Last BM Date: 09/01/12 (Pt says it was before I came to the hospital )  Intake/Output from previous day: 04/03 0701 - 04/04 0700 In: 3135.3 [P.O.:610; I.V.:2525.3] Out: 1275 [Urine:1275] Intake/Output this shift: Total I/O In: 120 [P.O.:120] Out: 250 [Urine:250]  General appearance: alert, cooperative and no distress Resp: clear to auscultation bilaterally GI: soft, quiet, incisional tenderness Midline wound clean, dry through dressing; RLQ wound - no sign of oozing  Lab Results:   Recent Labs  09/03/12 0555 09/04/12 0545  WBC 12.9* 10.7*  HGB 10.1* 8.5*  HCT 28.1* 23.3*  PLT 159 141*   BMET  Recent Labs  09/03/12 0555 09/04/12 0545  NA 137 135  K 4.3 4.2  CL 111 108  CO2 18* 20  GLUCOSE 143* 113*  BUN 27* 30*  CREATININE 2.24* 2.48*  CALCIUM 8.1* 8.1*   PT/INR No results found for this basename: LABPROT, INR,  in the last 72 hours ABG No results found for this basename: PHART, PCO2, PO2, HCO3,  in the last 72 hours  Studies/Results: No results found.  Anti-infectives: Anti-infectives   Start     Dose/Rate Route Frequency Ordered Stop   09/03/12 0600  ertapenem (INVANZ) 1 g in sodium chloride 0.9 % 50 mL IVPB     1 g 100 mL/hr over 30 Minutes Intravenous Every 24 hours 09/02/12 1337 09/03/12 0555   09/02/12 0600  ertapenem (INVANZ) 1 g in sodium chloride 0.9 % 50 mL IVPB     1 g 100 mL/hr over 30 Minutes Intravenous On call to O.R. 09/01/12 1422 09/02/12 0856      Assessment/Plan: s/p  Procedure(s): ILEOSTOMY REVERSAL (N/A) d/c foley Clears until bowel function Ambulate  LOS: 2 days    Rodney Kenton K. 09/04/2012

## 2012-09-04 NOTE — Plan of Care (Signed)
Problem: Phase I Progression Outcomes Goal: Voiding-avoid urinary catheter unless indicated Outcome: Completed/Met Date Met:  09/04/12 D/C foley catheter 1400 09/04/12

## 2012-09-04 NOTE — Progress Notes (Signed)
Patient ID: [name]., male   DOB: [birth year], 62 y.o.   MRN: [id number] Called by RN due to ooze from ileostomy takedown site wound.  Silver nitrate applied and bleeding stopped.  Wound re-packed.  2 sticks silver nitrate left at the bedside in case it is needed again. Georganna Skeans, MD, MPH, FACS Pager: [phone]

## 2012-09-05 ENCOUNTER — Inpatient Hospital Stay (HOSPITAL_COMMUNITY): Payer: Medicaid Other

## 2012-09-05 IMAGING — CR DG ABDOMEN 1V
1 series · 1 of 1 positions shown · non-contrast
Comparison: CT of the abdomen and pelvis dated [DATE].

CLINICAL DATA: Nausea and vomiting.  Status post ileostomy reversal
on [DATE].

ABDOMEN - 1 VIEW

[t abdomen supine]
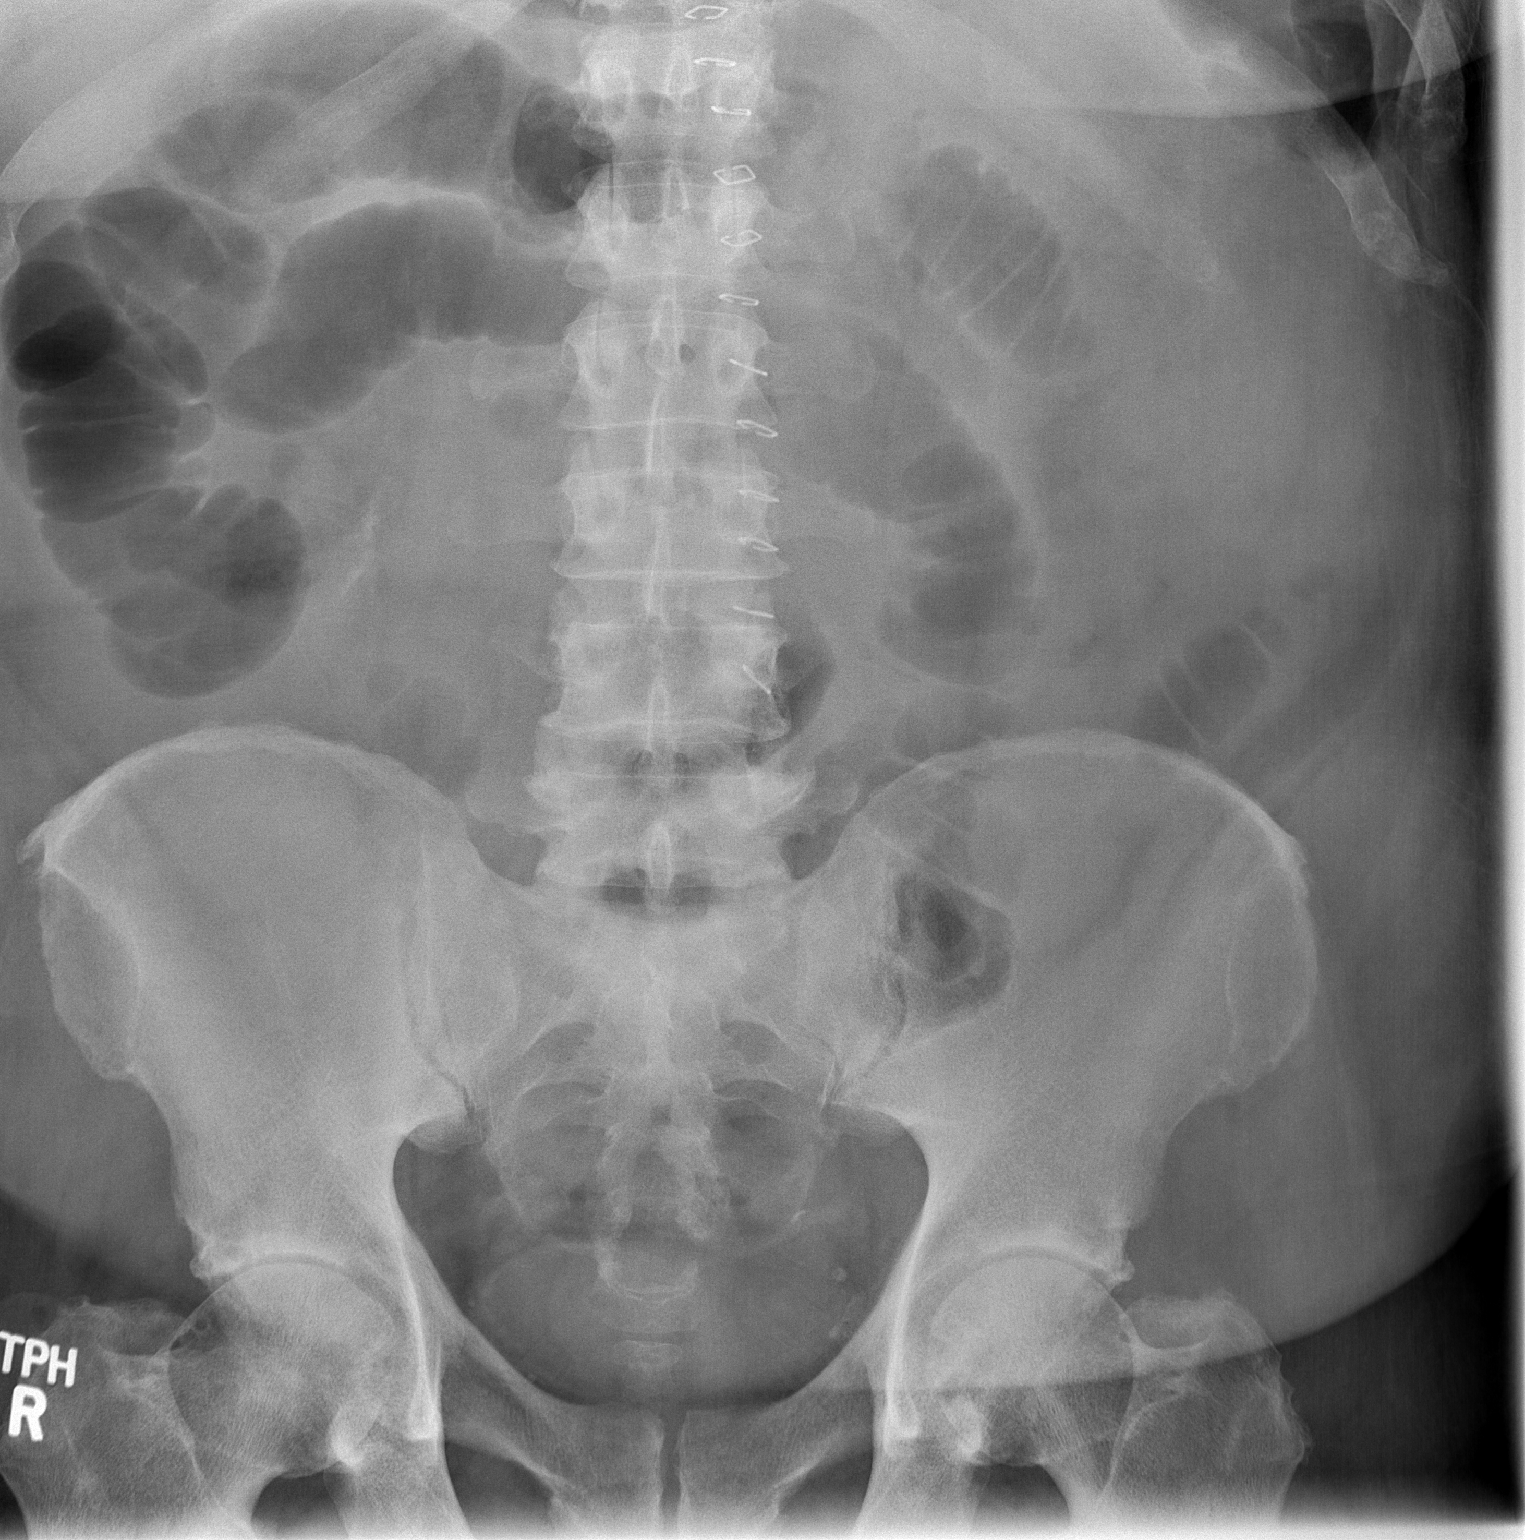

[1 of 1 positions shown; findings below may reference images not displayed]

FINDINGS: There are some mildly prominent small bowel loops seen in
the central abdomen.  The colon is relatively decompressed.
Findings may be consistent with postoperative ileus versus mild
partial small bowel obstruction.
IMPRESSION: Mildly dilated central small bowel loops.  This may be reflective
of ileus versus mild partial small bowel obstruction
postoperatively.

## 2012-09-05 MED ORDER — HYDROMORPHONE HCL PF 1 MG/ML IJ SOLN
0.5000 mg | INTRAMUSCULAR | Status: DC | PRN
  Administered 2012-09-05 – 2012-09-07 (×8): 1 mg via INTRAVENOUS
  Filled 2012-09-05 (×8): qty 1

## 2012-09-05 MED ORDER — ONDANSETRON HCL 4 MG/2ML IJ SOLN: 4.0000 mg | INTRAMUSCULAR | Status: DC | PRN

## 2012-09-05 MED ORDER — ONDANSETRON HCL 4 MG/2ML IJ SOLN
4.0000 mg | Freq: Three times a day (TID) | INTRAMUSCULAR | Status: DC | PRN
  Administered 2012-09-05 – 2012-09-06 (×2): 4 mg via INTRAVENOUS
  Filled 2012-09-05 (×2): qty 2

## 2012-09-05 MED ORDER — PANTOPRAZOLE SODIUM 40 MG PO TBEC
40.0000 mg | DELAYED_RELEASE_TABLET | Freq: Every day | ORAL | Status: DC
  Administered 2012-09-05: 40 mg via ORAL
  Filled 2012-09-05: qty 1

## 2012-09-05 MED ORDER — ALUM & MAG HYDROXIDE-SIMETH 200-200-20 MG/5ML PO SUSP
30.0000 mL | Freq: Four times a day (QID) | ORAL | Status: DC | PRN
  Administered 2012-09-05 (×3): 30 mL via ORAL
  Filled 2012-09-05 (×4): qty 30

## 2012-09-05 MED ORDER — METOCLOPRAMIDE HCL 5 MG/ML IJ SOLN
10.0000 mg | Freq: Four times a day (QID) | INTRAMUSCULAR | Status: DC
  Administered 2012-09-05 – 2012-09-07 (×8): 10 mg via INTRAVENOUS
  Filled 2012-09-05 (×15): qty 2

## 2012-09-05 MED ORDER — PANTOPRAZOLE SODIUM 40 MG IV SOLR
40.0000 mg | INTRAVENOUS | Status: DC
  Administered 2012-09-05: 40 mg via INTRAVENOUS
  Filled 2012-09-05: qty 40

## 2012-09-05 NOTE — Progress Notes (Signed)
3 Days Post-Op  Subjective: Pt had an large episode of vomiting this am then followed by a small bowel movement.  Pain still present.  Pt ambulating, tolerating only ice chips.  Pt continues to be real nauseated.    Objective: Vital signs in last 24 hours: Temp:  [98.5 F (36.9 C)-100.7 F (38.2 C)] 98.5 F (36.9 C) (04/05 0615) Pulse Rate:  [84-102] 102 (04/05 0615) Resp:  [15-25] 18 (04/05 0806) BP: (123-161)/(66-81) 161/81 mmHg (04/05 0615) SpO2:  [97 %-100 %] 97 % (04/05 0806) Last BM Date: 09/01/12 (Pre-op)  Intake/Output from previous day: 04/04 0701 - 04/05 0700 In: 2055 [P.O.:580; I.V.:1475] Out: 1870 [Urine:1870] Intake/Output this shift: Total I/O In: -  Out: 500 [Emesis/NG output:500]  PE: Gen:  Alert, NAD, pleasant Abd: Soft, NT/ND, diminished BS, no HSM, incisions with serosanguinous drainage on the honeycomb dressing   Lab Results:   Recent Labs  09/03/12 0555 09/04/12 0545  WBC 12.9* 10.7*  HGB 10.1* 8.5*  HCT 28.1* 23.3*  PLT 159 141*   BMET  Recent Labs  09/03/12 0555 09/04/12 0545  NA 137 135  K 4.3 4.2  CL 111 108  CO2 18* 20  GLUCOSE 143* 113*  BUN 27* 30*  CREATININE 2.24* 2.48*  CALCIUM 8.1* 8.1*   PT/INR No results found for this basename: LABPROT, INR,  in the last 72 hours CMP     Component Value Date/Time   NA 135 09/04/2012 0545   K 4.2 09/04/2012 0545   CL 108 09/04/2012 0545   CO2 20 09/04/2012 0545   GLUCOSE 113* 09/04/2012 0545   BUN 30* 09/04/2012 0545   CREATININE 2.48* 09/04/2012 0545   CREATININE 2.16* 07/31/2012 1421   CALCIUM 8.1* 09/04/2012 0545   CALCIUM 8.8 07/31/2012 1421   PROT 7.5 08/26/2012 1231   ALBUMIN 3.6 08/26/2012 1231   AST 37 08/26/2012 1231   ALT 37 08/26/2012 1231   ALKPHOS 81 08/26/2012 1231   BILITOT 0.5 08/26/2012 1231   GFRNONAA 26* 09/04/2012 0545   GFRAA 31* 09/04/2012 0545   Lipase     Component Value Date/Time   LIPASE 55 04/13/2012 1332       Studies/Results: No results  found.  Anti-infectives: Anti-infectives   Start     Dose/Rate Route Frequency Ordered Stop   09/03/12 0600  ertapenem (INVANZ) 1 g in sodium chloride 0.9 % 50 mL IVPB     1 g 100 mL/hr over 30 Minutes Intravenous Every 24 hours 09/02/12 1337 09/03/12 0555   09/02/12 0600  ertapenem (INVANZ) 1 g in sodium chloride 0.9 % 50 mL IVPB     1 g 100 mL/hr over 30 Minutes Intravenous On call to O.R. 09/01/12 1422 09/02/12 0856       Assessment/Plan Ileostomy reversal 1.  IVF, switch to PRN Dilaudid from PCA, antiemetics,  2.  Ambulation and IS, lovenox and scd's 3.  Await bowel function 4.  If continues to have nausea and vomiting this afternoon will likely need to put NG tube in.  Will order KUB and repeat labs for am.      LOS: 3 days    DORT, Mikala Podoll 09/05/2012, 8:52 AM Pager: [phone]

## 2012-09-05 NOTE — Progress Notes (Signed)
Nausea better and had a loose BM.  Will hold off on NGT unless nausea returns.Patient examined and I agree with the assessment and plan  Georganna Skeans, MD, MPH, FACS Pager: [phone]  09/05/2012 12:51 PM

## 2012-09-06 LAB — CBC
HCT: 24.5 % — ABNORMAL LOW (ref 39.0–52.0)
Hemoglobin: 8.7 g/dL — ABNORMAL LOW (ref 13.0–17.0)
MCH: 25.9 pg — ABNORMAL LOW (ref 26.0–34.0)
MCHC: 35.5 g/dL (ref 30.0–36.0)
MCV: 72.9 fL — ABNORMAL LOW (ref 78.0–100.0)
Platelets: 217 10*3/uL (ref 150–400)
RBC: 3.36 MIL/uL — ABNORMAL LOW (ref 4.22–5.81)
RDW: 14.8 % (ref 11.5–15.5)
WBC: 8.2 10*3/uL (ref 4.0–10.5)

## 2012-09-06 LAB — BASIC METABOLIC PANEL
BUN: 33 mg/dL — ABNORMAL HIGH (ref 6–23)
CO2: 22 mEq/L (ref 19–32)
Calcium: 8.6 mg/dL (ref 8.4–10.5)
Chloride: 112 mEq/L (ref 96–112)
Creatinine, Ser: 2.19 mg/dL — ABNORMAL HIGH (ref 0.50–1.35)
GFR calc Af Amer: 36 mL/min — ABNORMAL LOW (ref >90)
GFR calc non Af Amer: 31 mL/min — ABNORMAL LOW (ref >90)
Glucose, Bld: 128 mg/dL — ABNORMAL HIGH (ref 70–99)
Potassium: 3.5 mEq/L (ref 3.5–5.1)
Sodium: 142 mEq/L (ref 135–145)

## 2012-09-06 MED ORDER — PANTOPRAZOLE SODIUM 40 MG PO TBEC
40.0000 mg | DELAYED_RELEASE_TABLET | Freq: Two times a day (BID) | ORAL | Status: DC
  Administered 2012-09-06 – 2012-09-08 (×5): 40 mg via ORAL
  Filled 2012-09-06 (×4): qty 1

## 2012-09-06 MED ORDER — OXYCODONE-ACETAMINOPHEN 5-325 MG PO TABS
1.0000 | ORAL_TABLET | ORAL | Status: DC | PRN
  Administered 2012-09-06 – 2012-09-08 (×4): 1 via ORAL
  Filled 2012-09-06 (×4): qty 1

## 2012-09-06 NOTE — Progress Notes (Signed)
4 Days Post-Op  Subjective: Pt's nausea is much better and occurs right before he has to have a BM.  Pt has had 4-5 BM's since yesterday.  Pt's pain is about the same, but he is ambulating well.  He is very hungry and wants to eat.  Pt urinating well and passing flatus.    Objective: Vital signs in last 24 hours: Temp:  [98.6 F (37 C)-99 F (37.2 C)] 98.6 F (37 C) (04/05 2145) Pulse Rate:  [83-96] 83 (04/06 0523) Resp:  [16-18] 18 (04/06 0523) BP: (142-150)/(71-73) 142/72 mmHg (04/06 0523) SpO2:  [98 %-100 %] 98 % (04/06 0523) Last BM Date: 09/05/12  Intake/Output from previous day: 04/05 0701 - 04/06 0700 In: 1260 [I.V.:1260] Out: 900 [Urine:200; Emesis/NG output:700] Intake/Output this shift:    PE: Gen:  Alert, NAD, pleasant Abd: Soft, mildly tender, ND, +BS, no HSM, midline incision honeycomb dressing removed and redressed, RLQ wound with good granulation tissue, no signs of infection   Lab Results:   Recent Labs  09/04/12 0545 09/06/12 0620  WBC 10.7* 8.2  HGB 8.5* 8.7*  HCT 23.3* 24.5*  PLT 141* 217   BMET  Recent Labs  09/04/12 0545 09/06/12 0620  NA 135 142  K 4.2 3.5  CL 108 112  CO2 20 22  GLUCOSE 113* 128*  BUN 30* 33*  CREATININE 2.48* 2.19*  CALCIUM 8.1* 8.6   PT/INR No results found for this basename: LABPROT, INR,  in the last 72 hours CMP     Component Value Date/Time   NA 142 09/06/2012 0620   K 3.5 09/06/2012 0620   CL 112 09/06/2012 0620   CO2 22 09/06/2012 0620   GLUCOSE 128* 09/06/2012 0620   BUN 33* 09/06/2012 0620   CREATININE 2.19* 09/06/2012 0620   CREATININE 2.16* 07/31/2012 1421   CALCIUM 8.6 09/06/2012 0620   CALCIUM 8.8 07/31/2012 1421   PROT 7.5 08/26/2012 1231   ALBUMIN 3.6 08/26/2012 1231   AST 37 08/26/2012 1231   ALT 37 08/26/2012 1231   ALKPHOS 81 08/26/2012 1231   BILITOT 0.5 08/26/2012 1231   GFRNONAA 31* 09/06/2012 0620   GFRAA 36* 09/06/2012 0620   Lipase     Component Value Date/Time   LIPASE 55 04/13/2012 1332        Studies/Results: Dg Abd 1 View  09/05/2012  *RADIOLOGY REPORT*  Clinical Data: Nausea and vomiting.  Status post ileostomy reversal on 09/02/2012.  ABDOMEN - 1 VIEW  Comparison: CT of the abdomen and pelvis dated 03/29/2012.  Findings: There are some mildly prominent small bowel loops seen in the central abdomen.  The colon is relatively decompressed. Findings may be consistent with postoperative ileus versus mild partial small bowel obstruction.  IMPRESSION: Mildly dilated central small bowel loops.  This may be reflective of ileus versus mild partial small bowel obstruction postoperatively.   Original Report Authenticated By: Aletta Edouard, M.D.     Anti-infectives: Anti-infectives   Start     Dose/Rate Route Frequency Ordered Stop   09/03/12 0600  ertapenem (INVANZ) 1 g in sodium chloride 0.9 % 50 mL IVPB     1 g 100 mL/hr over 30 Minutes Intravenous Every 24 hours 09/02/12 1337 09/03/12 0555   09/02/12 0600  ertapenem (INVANZ) 1 g in sodium chloride 0.9 % 50 mL IVPB     1 g 100 mL/hr over 30 Minutes Intravenous On call to O.R. 09/01/12 1422 09/02/12 0856       Assessment/Plan Ileostomy reversal  1. IVF, oral pain meds, advance diet as tolerated since having 4-5 BM's 2. Ambulation and IS, lovenox and scd's  3. Start clears, fulls at dinner, then reg diet tomorrow am if tolerating 4. May be able to go home tomorrow  Creatinine improving ABL anemia - improving   LOS: 4 days    Coralie Keens 09/06/2012, 8:57 AM Pager: [phone]

## 2012-09-06 NOTE — Progress Notes (Signed)
Soft bowel movements.  Advance diet Will arrange home health nursing for dressing changes. PO Pain meds Home soon.  Imogene Burn. [name], MD, [facility] Surgery  09/06/2012 9:28 AM

## 2012-09-07 ENCOUNTER — Encounter: Payer: Self-pay | Admitting: Internal Medicine

## 2012-09-07 NOTE — Discharge Summary (Signed)
Physician Discharge Summary  Patient ID: [name]. MRN: [id number] DOB/AGE: 62-Apr-1952 62 y.o.  Admit date: 09/02/2012 Discharge date: 09/08/12  Admission Diagnoses:Ileostomy    Chronic renal insufficiency  Discharge Diagnoses: same Active Problems:   * No active hospital problems. *   Discharged Condition: Improved  Hospital Course: The patient underwent exploratory laparotomy with lysis of adhesions and ileostomy reversal with ileocolic anastomosis on [id number].  Post-operatively, he had his pain controlled with PCA Dilaudid until POD #3.  He began having bowel movements on POD#4.  His diet was slowly advanced.  His ileostomy site in the RLQ was dressed with daily wet to dry dressing changes.  He has been voiding without difficulty and his renal function has remained stable.  He was transitioned to PO pain meds with good pain control.    Consults: none  Significant Diagnostic Studies: none  Treatments: Surgery - as above  Discharge Exam: Blood pressure 127/71, pulse 79, temperature 99.6 F (37.6 C), temperature source Oral, resp. rate 18, height 5\' 11"  (1.803 m), weight 238 lb 8 oz (108.183 kg), SpO2 99.00%. General appearance: alert, cooperative and no distress Resp: clear to auscultation bilaterally GI: soft, non-tender; bowel sounds normal; no masses,  no organomegaly midline incision c/d/i; RLQ wound - granulating; clean  Disposition: 01-Home or [facility] to assist with daily wet to dry dressing changes.   Future Appointments Provider Department Dept Phone   09/15/2012 10:00 AM [facility] Nurse [facility], [place]   09/21/2012 4:20 PM Imogene Burn. [name], [facility], [place]   11/26/2012 11:00 AM [place] [id number]   11/26/2012 11:30 AM Ladell Pier, MD [place] [facility] [phone]       Medication List    ASK  your doctor about these medications       atorvastatin 20 MG tablet  Commonly known as:  LIPITOR  Take 1 tablet (20 mg total) by mouth at bedtime.     ferrous sulfate 325 (65 FE) MG EC tablet  Take 1 tablet (325 mg total) by mouth 2 (two) times daily.     loperamide 2 MG capsule  Commonly known as:  IMODIUM  Take 1 capsule (2 mg total) by mouth 4 (four) times daily as needed for diarrhea or loose stools (for very loose ileostomy output; output should be thicker (pancake batter consistency) not watery).     sodium bicarbonate 650 MG tablet  Take 650 mg by mouth 2 (two) times daily.     vitamin C 500 MG tablet  Commonly known as:  ASCORBIC ACID  Take 500 mg by mouth daily.           Follow-up Information   Follow up with Maia Petties., MD. (See discharge instructions for appointment times)    Contact information:   [address] [place] [address] [phone]       Signed: Maia Petties. 09/07/2012, 5:58 PM

## 2012-09-07 NOTE — Progress Notes (Signed)
5 Days Post-Op  Subjective: No more nausea; tolerating full liquids - wants to try soft diet A couple of BM yesterday Pain managed with PO pain meds  Objective: Vital signs in last 24 hours: Temp:  [98.5 F (36.9 C)-99.7 F (37.6 C)] 98.5 F (36.9 C) (04/07 0536) Pulse Rate:  [77-85] 77 (04/07 0536) Resp:  [16-20] 20 (04/07 0536) BP: (134-152)/(68-74) 134/71 mmHg (04/07 0536) SpO2:  [97 %-98 %] 98 % (04/07 0536) Last BM Date: 09/06/12  Intake/Output from previous day: 04/06 0701 - 04/07 0700 In: 2500 [I.V.:2500] Out: -  Intake/Output this shift:    GI: soft, minimal tenderness; +bs RLQ wound - granulating well; midline incision c/d/i  Lab Results:   Recent Labs  09/06/12 0620  WBC 8.2  HGB 8.7*  HCT 24.5*  PLT 217   BMET  Recent Labs  09/06/12 0620  NA 142  K 3.5  CL 112  CO2 22  GLUCOSE 128*  BUN 33*  CREATININE 2.19*  CALCIUM 8.6   PT/INR No results found for this basename: LABPROT, INR,  in the last 72 hours ABG No results found for this basename: PHART, PCO2, PO2, HCO3,  in the last 72 hours  Studies/Results: Dg Abd 1 View  09/05/2012  *RADIOLOGY REPORT*  Clinical Data: Nausea and vomiting.  Status post ileostomy reversal on 09/02/2012.  ABDOMEN - 1 VIEW  Comparison: CT of the abdomen and pelvis dated 03/29/2012.  Findings: There are some mildly prominent small bowel loops seen in the central abdomen.  The colon is relatively decompressed. Findings may be consistent with postoperative ileus versus mild partial small bowel obstruction.  IMPRESSION: Mildly dilated central small bowel loops.  This may be reflective of ileus versus mild partial small bowel obstruction postoperatively.   Original Report Authenticated By: Aletta Edouard, M.D.     Anti-infectives: Anti-infectives   Start     Dose/Rate Route Frequency Ordered Stop   09/03/12 0600  ertapenem (INVANZ) 1 g in sodium chloride 0.9 % 50 mL IVPB     1 g 100 mL/hr over 30 Minutes Intravenous  Every 24 hours 09/02/12 1337 09/03/12 0555   09/02/12 0600  ertapenem (INVANZ) 1 g in sodium chloride 0.9 % 50 mL IVPB     1 g 100 mL/hr over 30 Minutes Intravenous On call to O.R. 09/01/12 1422 09/02/12 0856      Assessment/Plan: s/p Procedure(s): ILEOSTOMY REVERSAL (N/A) Advance diet Plan for discharge tomorrow [place] for dressing changes  LOS: 5 days    [name] K. 09/07/2012

## 2012-09-07 NOTE — Care Management Note (Signed)
  Page 1 of 1   09/07/2012     12:26:55 PM   CARE MANAGEMENT NOTE 09/07/2012  Patient:  [name],[name]   Account Number:  [id number]  Date Initiated:  09/07/2012  Documentation initiated by:  Magdalen Spatz  Subjective/Objective Assessment:     Action/Plan:   Anticipated DC Date:  09/08/2012   Anticipated DC Plan:  [place]         Choice offered to / List presented to:  C-1 Patient        [place] arranged  HH-1 RN      [place].   Status of service:  Completed, signed off Medicare Important Message given?   (If response is "NO", the following Medicare IM given date fields will be blank) Date Medicare IM given:   Date Additional Medicare IM given:    Discharge Disposition:    Per UR Regulation:  Reviewed for med. necessity/level of care/duration of stay  If discussed at [place] of Stay Meetings, dates discussed:    Comments:

## 2012-09-08 MED ORDER — OXYCODONE-ACETAMINOPHEN 5-325 MG PO TABS: 1.0000 | ORAL_TABLET | ORAL | Status: DC | PRN

## 2012-09-08 NOTE — Progress Notes (Signed)
DC instructions given to pt at this time re: f/u appts, meds, activity, incision care, s/s of problems to report to md, community resources, and my chart.  Pt verbalized understanding of all instructions.

## 2012-09-11 ENCOUNTER — Encounter: Payer: Self-pay | Admitting: Internal Medicine

## 2012-09-11 DIAGNOSIS — N183 Chronic kidney disease, stage 3 unspecified: Secondary | ICD-10-CM

## 2012-09-15 ENCOUNTER — Ambulatory Visit (INDEPENDENT_AMBULATORY_CARE_PROVIDER_SITE_OTHER): Payer: Medicaid Other | Admitting: General Surgery

## 2012-09-15 ENCOUNTER — Encounter (INDEPENDENT_AMBULATORY_CARE_PROVIDER_SITE_OTHER): Payer: Self-pay | Admitting: General Surgery

## 2012-09-15 VITALS — BP 130/80 | HR 72 | Temp 97.8°F | Resp 14 | Ht 71.5 in | Wt 228.8 lb

## 2012-09-15 DIAGNOSIS — Z4802 Encounter for removal of sutures: Secondary | ICD-10-CM

## 2012-09-15 NOTE — Patient Instructions (Signed)
Patient came in today for staple removal from left hemicolectomy surgery. I placed a new wet gauze in the wound and placed a dry gauze over with some tape. Then I removed patient staple, the surgery site looked good it was healed up. I placed steri streps over the wound. I told [name] that he could take a shower and pat the wound dry once he come out of the shower and that the steri streps can stay on for one week. I gave [name] a reminder card to come back in to see Dr [name] on 09-21-12. He is also aware to call and ask for Deneise Lever if he has any questions.

## 2012-09-21 ENCOUNTER — Encounter (INDEPENDENT_AMBULATORY_CARE_PROVIDER_SITE_OTHER): Payer: Self-pay | Admitting: Surgery

## 2012-09-21 ENCOUNTER — Ambulatory Visit (INDEPENDENT_AMBULATORY_CARE_PROVIDER_SITE_OTHER): Payer: Medicaid Other | Admitting: Surgery

## 2012-09-21 VITALS — BP 142/80 | HR 84 | Temp 101.2°F | Resp 16 | Ht 71.5 in | Wt 226.0 lb

## 2012-09-21 DIAGNOSIS — R198 Other specified symptoms and signs involving the digestive system and abdomen: Secondary | ICD-10-CM

## 2012-09-21 DIAGNOSIS — Z932 Ileostomy status: Secondary | ICD-10-CM

## 2012-09-21 MED ORDER — OXYCODONE-ACETAMINOPHEN 5-325 MG PO TABS: 1.0000 | ORAL_TABLET | ORAL | Status: DC | PRN

## 2012-09-21 MED ORDER — AMOXICILLIN-POT CLAVULANATE 875-125 MG PO TABS: 1.0000 | ORAL_TABLET | Freq: Two times a day (BID) | ORAL | Status: AC

## 2012-09-21 NOTE — Progress Notes (Signed)
Status post ileostomy reversal on 09/02/12. He was discharged on 09/08/12.  He has been doing reasonably well. He still feels weak and was discharged on an iron supplement.  He is having regular bowel movements everyday. His appetite has still not returned to normal. He has some tenderness just to the right of this incision. He is voiding well without difficulty.  Filed Vitals:   09/21/12 1538  BP: 142/80  Pulse: 84  Temp: 101.2 F (38.4 C)  Resp: 16    His abdomen is soft with only minimal incisional tenderness. He is tender mostly to the right of his incision. There is a localized area about 3 cm in diameter to the right of his upper incision that is slightly raised. There is no obvious erythema but this area does feel warm. This may represent a subcutaneous hematoma. His right lower quadrant ileostomy site is well granulated and is quite small. This appears clean and free of any infection. No other significant abdominal tenderness.  #1 the patient seems to be doing well after ileostomy closure.  #2 he may have a subcutaneous hematoma to the right of his incision. There is a slight possibility that this hematoma may be infected. We will go ahead and start him on Augmentin today. At this time I don't think that the area needs to be drained but we will recheck him in a few days to see if this has gotten any better. I also refilled his pain medication.  Imogene Burn. [name], MD, [facility] Surgery  09/21/2012 4:20 PM

## 2012-09-23 ENCOUNTER — Encounter (INDEPENDENT_AMBULATORY_CARE_PROVIDER_SITE_OTHER): Payer: Medicaid Other | Admitting: General Surgery

## 2012-09-23 ENCOUNTER — Telehealth (INDEPENDENT_AMBULATORY_CARE_PROVIDER_SITE_OTHER): Payer: Self-pay | Admitting: *Deleted

## 2012-09-23 NOTE — Telephone Encounter (Signed)
Patient called with urgent office appt however patient states he doesn't have transportation.  Patient states he has to arrange his transportation 24 hours in advance.  Patient advised that he if feels worse he will need to go to the ED for evaluation since he can't come into the office for [place] to see him.  Patient is having his daughter come to bring him a thermometer to he can continue to monitor his fever.

## 2012-09-23 NOTE — Telephone Encounter (Signed)
Patient called to state that the home health nurse suggested he call due to continued fever (99.32F today).  Patient states incision site still looks the same "puffy".  Patient denies any nausea, vomiting, or diarrhea.  Patient denies any symptoms other than the above.  Spoke to Carmine MD who states patient needs to be seen in urgent office.

## 2012-09-24 ENCOUNTER — Encounter (INDEPENDENT_AMBULATORY_CARE_PROVIDER_SITE_OTHER): Payer: Medicaid Other | Admitting: Surgery

## 2012-09-28 ENCOUNTER — Encounter (INDEPENDENT_AMBULATORY_CARE_PROVIDER_SITE_OTHER): Payer: Self-pay | Admitting: Surgery

## 2012-09-28 ENCOUNTER — Ambulatory Visit (INDEPENDENT_AMBULATORY_CARE_PROVIDER_SITE_OTHER): Payer: Medicaid Other | Admitting: Surgery

## 2012-09-28 ENCOUNTER — Other Ambulatory Visit (INDEPENDENT_AMBULATORY_CARE_PROVIDER_SITE_OTHER): Payer: Self-pay | Admitting: Surgery

## 2012-09-28 VITALS — BP 112/58 | HR 79 | Temp 98.7°F | Ht 71.5 in | Wt 217.8 lb

## 2012-09-28 DIAGNOSIS — Z5189 Encounter for other specified aftercare: Secondary | ICD-10-CM

## 2012-09-28 DIAGNOSIS — IMO0002 Reserved for concepts with insufficient information to code with codable children: Secondary | ICD-10-CM | POA: Insufficient documentation

## 2012-09-28 DIAGNOSIS — N6310 Unspecified lump in the right breast, unspecified quadrant: Secondary | ICD-10-CM

## 2012-09-28 DIAGNOSIS — C189 Malignant neoplasm of colon, unspecified: Secondary | ICD-10-CM

## 2012-09-28 DIAGNOSIS — T888XXD Other specified complications of surgical and medical care, not elsewhere classified, subsequent encounter: Secondary | ICD-10-CM

## 2012-09-28 MED ORDER — OXYCODONE-ACETAMINOPHEN 5-325 MG PO TABS: 1.0000 | ORAL_TABLET | ORAL | Status: DC | PRN

## 2012-09-28 NOTE — Progress Notes (Signed)
The patient returns for another wound check. The swelling on the right side of this incision got worse during the week. We tried to get him in to the office Thursday and Friday but he had difficulty with transportation. Over the weekend this area spontaneously burst and small blood came out. The patient states that there was a little bit of pus as well. He feels much better now.  On examination, the swelling on the right side of the wound has resolved. There is still some firmness to the subcutaneous tissues. There is a 5 mm opening in his midline incision. A probe this area with a cotton swab and there is a 2 x 3 cm cavity below the skin. We prepped this area with Betadine and anesthetized with 1% lidocaine. I opened the incision for about 3 cm. We evacuated some old blood clot. The wound is packed with 4 x 4 gauze. The patient will begin doing daily wet-to-dry dressings. He is familiar with this treatment method and has done this before in the past. We will recheck him in 2 weeks.  Appetite and bowel movements remain unchanged.  Imogene Burn. [name], MD, [facility] Surgery  09/28/2012 4:51 PM

## 2012-10-13 ENCOUNTER — Ambulatory Visit (INDEPENDENT_AMBULATORY_CARE_PROVIDER_SITE_OTHER): Payer: Medicaid Other | Admitting: Surgery

## 2012-10-13 ENCOUNTER — Encounter (INDEPENDENT_AMBULATORY_CARE_PROVIDER_SITE_OTHER): Payer: Self-pay | Admitting: Surgery

## 2012-10-13 VITALS — BP 126/80 | HR 71 | Temp 97.4°F | Resp 14 | Ht 71.5 in | Wt 215.4 lb

## 2012-10-13 DIAGNOSIS — Z9889 Other specified postprocedural states: Secondary | ICD-10-CM

## 2012-10-13 DIAGNOSIS — Z932 Ileostomy status: Secondary | ICD-10-CM

## 2012-10-13 HISTORY — DX: Other specified postprocedural states: Z98.890

## 2012-10-13 NOTE — Progress Notes (Signed)
He is here for wound check. The hematoma/abscess to the right of the incision has resolved. Continue with the packing in this area. There is minimal bloody drainage coming from the site. No cellulitis. He has an area of tenderness at the top of his incision that seems to be an old suture granuloma. If this persists then we will consider local excision of the suture. For now continue with dressing changes daily. The patient is doing his own dressing changes. Recheck in 3 weeks. He reports regular bowel movements and good appetite.  Imogene Burn. [name], MD, [facility] Tra[facility] Surgery  10/13/2012 3:33 PM

## 2012-10-23 ENCOUNTER — Encounter: Payer: Self-pay | Admitting: Gastroenterology

## 2012-10-28 ENCOUNTER — Telehealth: Payer: Self-pay | Admitting: Gastroenterology

## 2012-10-28 NOTE — Telephone Encounter (Signed)
Pt had ileostomy reversal surgery in April. Pt received recall letter for colon that is due in June. Pt wants to know if they still need to have the colon since they recently had surgery. Please advise.

## 2012-10-29 NOTE — Telephone Encounter (Signed)
Spoke with pt and he is aware. Pt states he sees his surgeon the 1st week of June and he will check with the surgeon to see if he is cleared and if the surgeon states it is ok for him to have the colon. Pt will call back after that appointment.

## 2012-10-29 NOTE — Telephone Encounter (Signed)
Yes, needs 1 year f/u

## 2012-10-29 NOTE — Telephone Encounter (Signed)
Left message for pt to call back  °

## 2012-11-05 ENCOUNTER — Encounter (INDEPENDENT_AMBULATORY_CARE_PROVIDER_SITE_OTHER): Payer: Self-pay | Admitting: Surgery

## 2012-11-05 ENCOUNTER — Ambulatory Visit (INDEPENDENT_AMBULATORY_CARE_PROVIDER_SITE_OTHER): Payer: Medicaid Other | Admitting: Surgery

## 2012-11-05 VITALS — BP 158/72 | HR 72 | Temp 97.0°F | Resp 16 | Ht 71.5 in | Wt 220.6 lb

## 2012-11-05 DIAGNOSIS — Z932 Ileostomy status: Secondary | ICD-10-CM

## 2012-11-05 DIAGNOSIS — C189 Malignant neoplasm of colon, unspecified: Secondary | ICD-10-CM

## 2012-11-05 DIAGNOSIS — Z9889 Other specified postprocedural states: Secondary | ICD-10-CM

## 2012-11-05 NOTE — Patient Instructions (Addendum)
You may have a colonoscopy in 6 months.

## 2012-11-05 NOTE — Progress Notes (Signed)
Status post ileostomy reversal on 09/02/12. The patient is doing reasonably well. He is having bowel movements each day. His appetite is good. His wounds have all healed completely except for a small bud of granulation tissue in the middle of his midline incision. We cauterized this area with silver nitrate. The remainder of his incision seems to be well healed. He may resume full activity.  He had a phone call from Dr. Kelby Fam office regarding a followup colonoscopy. He should wait till at least October to have another colonoscopy. I will forward copies of this note to [name].  The patient may resume full activity and followup as needed.  Imogene Burn. [name], MD, [facility]  General/ Trauma Surgery  11/05/2012 5:05 PM

## 2012-11-26 ENCOUNTER — Ambulatory Visit (HOSPITAL_BASED_OUTPATIENT_CLINIC_OR_DEPARTMENT_OTHER): Payer: Medicaid Other | Admitting: Lab

## 2012-11-26 ENCOUNTER — Other Ambulatory Visit (HOSPITAL_BASED_OUTPATIENT_CLINIC_OR_DEPARTMENT_OTHER): Payer: Medicaid Other | Admitting: Lab

## 2012-11-26 ENCOUNTER — Telehealth: Payer: Self-pay | Admitting: Oncology

## 2012-11-26 ENCOUNTER — Ambulatory Visit (HOSPITAL_BASED_OUTPATIENT_CLINIC_OR_DEPARTMENT_OTHER): Payer: Medicaid Other | Admitting: Oncology

## 2012-11-26 VITALS — BP 145/86 | HR 68 | Temp 97.1°F | Resp 19 | Ht 71.0 in | Wt 220.7 lb

## 2012-11-26 DIAGNOSIS — D539 Nutritional anemia, unspecified: Secondary | ICD-10-CM

## 2012-11-26 DIAGNOSIS — C189 Malignant neoplasm of colon, unspecified: Secondary | ICD-10-CM

## 2012-11-26 LAB — CBC WITH DIFFERENTIAL/PLATELET
BASO%: 0.1 % (ref 0.0–2.0)
Basophils Absolute: 0 10*3/uL (ref 0.0–0.1)
EOS%: 3.5 % (ref 0.0–7.0)
Eosinophils Absolute: 0.3 10*3/uL (ref 0.0–0.5)
HCT: 31.6 % — ABNORMAL LOW (ref 38.4–49.9)
HGB: 10.7 g/dL — ABNORMAL LOW (ref 13.0–17.1)
LYMPH%: 36.8 % (ref 14.0–49.0)
MCH: 24.7 pg — ABNORMAL LOW (ref 27.2–33.4)
MCHC: 33.9 g/dL (ref 32.0–36.0)
MCV: 73 fL — ABNORMAL LOW (ref 79.3–98.0)
MONO#: 0.6 10*3/uL (ref 0.1–0.9)
MONO%: 7.4 % (ref 0.0–14.0)
NEUT#: 3.9 10*3/uL (ref 1.5–6.5)
NEUT%: 52.2 % (ref 39.0–75.0)
Platelets: 237 10*3/uL (ref 140–400)
RBC: 4.33 10*6/uL (ref 4.20–5.82)
RDW: 18.3 % — ABNORMAL HIGH (ref 11.0–14.6)
WBC: 7.5 10*3/uL (ref 4.0–10.3)
lymph#: 2.8 10*3/uL (ref 0.9–3.3)

## 2012-11-26 LAB — CEA: CEA: 2.2 ng/mL (ref 0.0–5.0)

## 2012-11-26 NOTE — Progress Notes (Signed)
Ileostomy reversal 09/02/12-bowel function normal. During med list review determined that he had stopped taking his Lipitor. Encouraged him to resume this and to continue follow up with PCP to monitor.

## 2012-11-26 NOTE — Telephone Encounter (Signed)
gv adn printed appt sched and avs ....sent pt back to lab

## 2012-11-26 NOTE — Progress Notes (Signed)
   [facility]    OFFICE PROGRESS NOTE   INTERVAL HISTORY:   He returns as scheduled. He underwent an ileostomy reversal in April. He reports feeling well. No specific complaint. Mr. Rivera is no longer taking iron. He took iron for several months after we saw him in December of 2013.  Objective:  Vital signs in last 24 hours:  Blood pressure 145/86, pulse 68, temperature 97.1 F (36.2 C), temperature source Oral, resp. rate 19, height 5\' 11"  (1.803 m), weight 220 lb 11.2 oz (100.109 kg).    HEENT: Neck without mass Lymphatics: No cervical, supraclavicular, or inguinal nodes. "Shotty "axillary nodes. Resp: Lungs with bilateral end inspiratory coarse rhonchi at the bases, no respiratory distress Cardio: Regular rate and rhythm GI: No hepatomegaly, nontender, no mass Vascular: No leg edema   Lab Results:  Lab Results  Component Value Date   WBC 8.2 09/06/2012   HGB 8.7* 09/06/2012   HCT 24.5* 09/06/2012   MCV 72.9* 09/06/2012   PLT 217 09/06/2012      Medications: I have reviewed the patient's current medications.  Assessment/Plan: 1. Adenocarcinoma of the transverse colon, stage II (T3 N0), status post a right hemicolectomy on 02/06/2012. The tumor was microsatellite stable.  2. Postoperative anastomotic leak/septic shock, status post resection of the anastomosis and creation of an ileostomy , status post ileostomy takedown April 2014 to 3. history of Repeat admissions with dehydration/acute renal failure secondary to a high output ileostomy  4. Diabetes  5. Microcytic anemia August 2013-predating surgery , the anemia persisted when the hemoglobin was last checked in April 2014.  Disposition:  Mr. Caughell remains in clinical remission from colon cancer. We will followup on the CEA from today. We will check a CBC today to followup on the microcytic anemia.  Mr. Shear will return for an office visit and CEA in 6 months. He will continue colonoscopy followup with  [name].   Betsy Coder, MD  11/26/2012  12:31 PM

## 2012-11-27 ENCOUNTER — Telehealth: Payer: Self-pay | Admitting: *Deleted

## 2012-11-27 ENCOUNTER — Telehealth: Payer: Self-pay | Admitting: Oncology

## 2012-11-27 DIAGNOSIS — D539 Nutritional anemia, unspecified: Secondary | ICD-10-CM

## 2012-11-27 LAB — FERRITIN: Ferritin: 39 ng/mL (ref 22–322)

## 2012-11-27 MED ORDER — FERROUS SULFATE 325 (65 FE) MG PO TABS: 325.0000 mg | ORAL_TABLET | Freq: Two times a day (BID) | ORAL | Status: DC

## 2012-11-27 NOTE — Telephone Encounter (Signed)
Left detailed voice mail at home to resume his ferrous sulfate 325 mg twice daily since Hgb is still low. Will recheck in 3 months. Scheduler will call him for appointment. Will check ferritin also.

## 2012-11-27 NOTE — Addendum Note (Signed)
Addended by: Tania Ade on: 11/27/2012 01:51 PM   Modules accepted: Orders

## 2012-11-27 NOTE — Telephone Encounter (Signed)
Talked to pt and gave him September 2014 lab and MD

## 2012-11-27 NOTE — Telephone Encounter (Signed)
Message copied by Tania Ade on Fri Nov 27, 2012  1:44 PM ------      Message from: Betsy Coder B      Created: Thu Nov 26, 2012  9:58 PM       Please call patient, hb is better but still low, resume ferrous sulfate 325mg  bid, check cbc 3 mo., add ferritin to 6/26 lab ------

## 2012-11-30 ENCOUNTER — Telehealth: Payer: Self-pay | Admitting: *Deleted

## 2012-11-30 NOTE — Telephone Encounter (Signed)
Message copied by Brien Few on Mon Nov 30, 2012  4:53 PM ------      Message from: Ladell Pier      Created: Fri Nov 27, 2012  4:28 PM       Please call patient, cea is normal, add cea with 02/2013 lab ------

## 2012-11-30 NOTE — Telephone Encounter (Signed)
Called pt with CEA results. Normal, per Dr. Benay Spice. Will recheck CEA with next office visit.

## 2012-12-10 ENCOUNTER — Other Ambulatory Visit: Payer: Self-pay

## 2012-12-17 NOTE — Addendum Note (Signed)
Addended by: Hulan Fray on: 12/17/2012 07:05 PM   Modules accepted: Orders

## 2013-01-25 ENCOUNTER — Ambulatory Visit: Payer: Medicaid Other | Admitting: Internal Medicine

## 2013-01-26 ENCOUNTER — Encounter: Payer: Self-pay | Admitting: Internal Medicine

## 2013-02-24 ENCOUNTER — Telehealth: Payer: Self-pay | Admitting: Oncology

## 2013-02-24 ENCOUNTER — Telehealth: Payer: Self-pay | Admitting: *Deleted

## 2013-02-24 NOTE — Telephone Encounter (Signed)
Called pt and left message regarding lab for 9/26

## 2013-02-24 NOTE — Telephone Encounter (Signed)
Called patient to remind him of lab appointment for Friday,but his office visit was scheduled in error. Does not need to see Dr. Benay Spice until December, unless he is having problems. He reports no problems and appreciates the call. Will pick up copy of his schedule on Friday when he comes in.

## 2013-02-26 ENCOUNTER — Other Ambulatory Visit: Payer: Medicaid Other

## 2013-02-26 ENCOUNTER — Other Ambulatory Visit: Payer: Medicaid Other | Admitting: Lab

## 2013-02-26 ENCOUNTER — Ambulatory Visit: Payer: Medicaid Other | Admitting: Oncology

## 2013-03-08 ENCOUNTER — Ambulatory Visit (INDEPENDENT_AMBULATORY_CARE_PROVIDER_SITE_OTHER): Payer: Medicaid Other | Admitting: Internal Medicine

## 2013-03-08 ENCOUNTER — Encounter: Payer: Self-pay | Admitting: Internal Medicine

## 2013-03-08 VITALS — BP 150/80 | HR 70 | Temp 97.9°F | Ht 71.5 in | Wt 237.7 lb

## 2013-03-08 DIAGNOSIS — N183 Chronic kidney disease, stage 3 unspecified: Secondary | ICD-10-CM

## 2013-03-08 DIAGNOSIS — I10 Essential (primary) hypertension: Secondary | ICD-10-CM | POA: Insufficient documentation

## 2013-03-08 DIAGNOSIS — I129 Hypertensive chronic kidney disease with stage 1 through stage 4 chronic kidney disease, or unspecified chronic kidney disease: Secondary | ICD-10-CM

## 2013-03-08 DIAGNOSIS — F17200 Nicotine dependence, unspecified, uncomplicated: Secondary | ICD-10-CM

## 2013-03-08 DIAGNOSIS — C189 Malignant neoplasm of colon, unspecified: Secondary | ICD-10-CM

## 2013-03-08 MED ORDER — LISINOPRIL 5 MG PO TABS: 5.0000 mg | ORAL_TABLET | Freq: Every day | ORAL | Status: DC

## 2013-03-08 NOTE — Assessment & Plan Note (Signed)
I spent significant amount of time educating the patient about the risk of continued cigarette smoking, including COPD, lung cancer, coronary artery disease, among other health problems. The patient verbalizes understanding. Patient not ready to quit. I will continue to assist him quit.

## 2013-03-08 NOTE — Assessment & Plan Note (Addendum)
Encourage him to follow up with oncology. As per Dr Benay Spice, he should wait at least until 03/2013 to get colonoscopy. I will follow up on this. Gave him a letter to start working.

## 2013-03-08 NOTE — Assessment & Plan Note (Signed)
BP Readings from Last 3 Encounters:  03/08/13 150/80  11/26/12 145/86  11/05/12 158/72    Lab Results  Component Value Date   NA 142 09/06/2012   K 3.5 09/06/2012   CREATININE 2.19* 09/06/2012    Assessment: Blood pressure control:   not controlled Progress toward BP goal:    deteriorated  Comments: pt was reported started on antihypertension medication last week but he has not started taking it. Will call [place] office for medical records.   Plan: Medications:  will get records from nephrologist office to find out what was prescribed.  Educational resources provided:   Self management tools provided:   Other plans: will follow up in 2 weeks for BP recheck  Encouraged him to pick his medications from pharmacy Will call for lab results from nephrologist office, otherwise I will have to order bmet and cbc

## 2013-03-08 NOTE — Assessment & Plan Note (Signed)
I will call Dr Deterding's office for clinic note and labs.

## 2013-03-08 NOTE — Patient Instructions (Addendum)
General Instructions: I would recommend that we wait on plasma donation for now  I will call your kidney doctor's office to get results from recent labs   Please you may go back to part time work Please stop smoking  YOU BLOOD PRESSURE IS HIGH. I have started you on treatment with Lisinopril  Please comeback in 2 weeks for recheck of your blood pressure Treatment Goals:  Goals (1 Years of Data) as of 03/08/13         As of Today As of Today 11/26/12 11/05/12 10/13/12     Blood Pressure    . Blood Pressure < 130/80  150/80 156/86 145/86 158/72 126/80    . Blood Pressure < 140/90  150/80 156/86 145/86 158/72 126/80    . Blood Pressure < 140/90  150/80 156/86 145/86 158/72 126/80     Lifestyle    . Quit smoking / using tobacco          . Quit smoking / using tobacco           Result Component    . HEMOGLOBIN A1C < 7.0          . HEMOGLOBIN A1C < 7.0          . LDL CALC < 100          . LDL CALC < 100          . LDL CALC < 100            Progress Toward Treatment Goals:  Treatment Goal 03/08/2013  Hemoglobin A1C -  Blood pressure -  Stop smoking smoking the same amount    Self Care Goals & Plans:  Self Care Goal 03/08/2013  Manage my medications take my medicines as prescribed; bring my medications to every visit  Monitor my health -  Eat healthy foods -  Be physically active -  Stop smoking -       Care Management & Community Referrals:  Referral 03/08/2013  Referrals made for care management support smoking cessation counselor

## 2013-03-08 NOTE — Progress Notes (Signed)
Patient ID: Rodney Reasons., male   DOB: 01-18-1951, 62 y.o.   MRN: [id number]   Subjective:   HPI: [name] is a 62 y.o. African American gentleman, with past medical history of colon cancer, status post surgery 2014, chronic kidney disease, hypertension, and cigarette smoking, presents to the clinic for routine followup visit.  Patient is otherwise feeling well since undergoing his last surgery in April 2014. His energy level is better and he follows closely with oncology. His next visit is 05/2013.  All his surgical incisions are well healed. He has questions regarding whether he can go back to at least part-time work. He also inquires whether he can donate plasma. No complaints otherwise.  The patient tells me that she was seen sometime last week by his nephrologist and several labs were ordered. He was also started on a new blood pressure medication but he has not picked it from pharmacy. His blood pressure in the office is slightly elevated at 150/80 (checked in two occassions).  Patient has continued to smoke and he is not quite ready to quit.  Kindly see the A&P for the status of the pt's chronic medical problems.     Past Medical History  Diagnosis Date  . Hyperlipidemia   . Hypertension     for a few years and resolved in 2013/2014.  Marland Kitchen Heart murmur   . Diabetes mellitus     type 2 IDDM x 15 years  . Sleep apnea     does not wear CPAP now  . 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 Arthritis     left knee  . 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 12/25/2011  . Chronic kidney disease     acute renal failure/injury requiring short-term HD 2013   Current Outpatient Prescriptions  Medication Sig Dispense Refill  . atorvastatin (LIPITOR) 20 MG tablet Take 1 tablet (20 mg total) by mouth at bedtime.  90 tablet  1  . ferrous sulfate 325 (65 FE) MG tablet Take 1 tablet (325 mg total) by mouth 2 (two) times daily.     3  . sodium bicarbonate 650 MG tablet Take 650 mg by mouth 2 (two) times daily.      . vitamin C (ASCORBIC ACID) 500 MG tablet Take 500 mg by mouth daily.       No current facility-administered medications for this visit.   Family History  Problem Relation Age of Onset  . Diabetes Father   . Heart disease Father   . Colon cancer Neg Hx   . Diabetes Sister   . Heart disease Sister   . Heart disease Mother    History   Social History  . Marital Status: Divorced    Spouse Name: N/A    Number of Children: 3  . Years of Education: N/A   Occupational History  .      Worked at [employer] Topics  . Smoking status: Current Every Day Smoker -- 0.30 packs/day for 30 years    Types: Cigarettes  . Smokeless tobacco: Never Used  . Alcohol Use: Yes     Comment: Liquor twice monthly.  . Drug Use: No  . Sexual Activity: None   Other Topics Concern  . None   Social History Narrative   Financial assistance approved for 100% discount at [facility] and has [facility] card   [employer]  August 08, 2009 5:48 PM   *PATIENT WAS GIVEN DM CARD.Lela Sturdivant NT II  June 08, 2010 3:56 PM      Divorced, one daughter currently staying with him   Has 7 grand children   Reports disability and medicaid is pending   Review of Systems: Constitutional: Denies fever, chills, diaphoresis, appetite change and fatigue.  Respiratory: Denies SOB, DOE, cough, chest tightness, and wheezing. Denies chest pain. Cardiovascular: No chest pain, palpitations and leg swelling.  Gastrointestinal: No abdominal pain, nausea, vomiting, bloody stools Genitourinary: No dysuria, frequency, hematuria, or flank pain.  Musculoskeletal: No myalgias, back pain, joint swelling, arthralgias  Psych: No depression symptoms. No SI or SA.   Objective:  Physical Exam: Filed Vitals:   03/08/13 1542 03/08/13 1605 03/08/13 1606  BP: 156/86 150/80   Pulse: 73  70  Temp: 97.9 F (36.6 C)    TempSrc: Oral      Height: 5' 11.5" (1.816 m)    Weight: 237 lb 11.2 oz (107.82 kg)    SpO2: 98%     General: Well nourished. No acute distress.  HEENT: Normal oral mucosa. MMM.  Lungs: CTA bilaterally. Heart: RRR; no extra sounds or murmurs  Abdomen: Non-distended, normal BS, soft, nontender; no hepatosplenomegaly. Surgical scar from ileostomy well healed.  Extremities: No pedal edema. No joint swelling or tenderness. Neurologic: Normal EOM,  Alert and oriented x3. No obvious neurologic/cranial nerve deficits.  Assessment & Plan:  I have discussed my assessment and plan  with  my attending in the clinic, Dr. Eppie Gibson  as detailed under problem based charting.

## 2013-03-09 NOTE — Progress Notes (Signed)
Case discussed with Dr. Kazibwe at the time of the visit.  We reviewed the resident's history and exam and pertinent patient test results.  I agree with the assessment, diagnosis and plan of care documented in the resident's note. 

## 2013-03-24 ENCOUNTER — Encounter: Payer: Self-pay | Admitting: Internal Medicine

## 2013-03-24 ENCOUNTER — Other Ambulatory Visit: Payer: Self-pay | Admitting: Internal Medicine

## 2013-03-24 DIAGNOSIS — I10 Essential (primary) hypertension: Secondary | ICD-10-CM

## 2013-03-24 MED ORDER — AMLODIPINE BESYLATE 5 MG PO TABS: 5.0000 mg | ORAL_TABLET | Freq: Every day | ORAL | Status: DC

## 2013-03-29 ENCOUNTER — Encounter (HOSPITAL_COMMUNITY): Payer: Self-pay | Admitting: Emergency Medicine

## 2013-03-29 ENCOUNTER — Emergency Department (HOSPITAL_COMMUNITY)
Admission: EM | Admit: 2013-03-29 | Discharge: 2013-03-29 | Disposition: A | Discharge status: Home / Self Care | Payer: Medicaid Other | Attending: Emergency Medicine | Admitting: Emergency Medicine

## 2013-03-29 ENCOUNTER — Emergency Department (HOSPITAL_COMMUNITY): Payer: Medicaid Other

## 2013-03-29 DIAGNOSIS — M25519 Pain in unspecified shoulder: Secondary | ICD-10-CM | POA: Insufficient documentation

## 2013-03-29 DIAGNOSIS — Z860100 Personal history of colon polyps, unspecified: Secondary | ICD-10-CM | POA: Insufficient documentation

## 2013-03-29 DIAGNOSIS — E119 Type 2 diabetes mellitus without complications: Secondary | ICD-10-CM | POA: Insufficient documentation

## 2013-03-29 DIAGNOSIS — Z79899 Other long term (current) drug therapy: Secondary | ICD-10-CM | POA: Insufficient documentation

## 2013-03-29 DIAGNOSIS — N189 Chronic kidney disease, unspecified: Secondary | ICD-10-CM | POA: Insufficient documentation

## 2013-03-29 DIAGNOSIS — Z8739 Personal history of other diseases of the musculoskeletal system and connective tissue: Secondary | ICD-10-CM | POA: Insufficient documentation

## 2013-03-29 DIAGNOSIS — R011 Cardiac murmur, unspecified: Secondary | ICD-10-CM | POA: Insufficient documentation

## 2013-03-29 DIAGNOSIS — R079 Chest pain, unspecified: Secondary | ICD-10-CM

## 2013-03-29 DIAGNOSIS — R0789 Other chest pain: Secondary | ICD-10-CM | POA: Insufficient documentation

## 2013-03-29 DIAGNOSIS — I129 Hypertensive chronic kidney disease with stage 1 through stage 4 chronic kidney disease, or unspecified chronic kidney disease: Secondary | ICD-10-CM | POA: Insufficient documentation

## 2013-03-29 DIAGNOSIS — F17200 Nicotine dependence, unspecified, uncomplicated: Secondary | ICD-10-CM | POA: Insufficient documentation

## 2013-03-29 LAB — CBC WITH DIFFERENTIAL/PLATELET
Basophils Absolute: 0 10*3/uL (ref 0.0–0.1)
Basophils Relative: 0 % (ref 0–1)
Eosinophils Absolute: 0.2 10*3/uL (ref 0.0–0.7)
Eosinophils Relative: 3 % (ref 0–5)
HCT: 34.4 % — ABNORMAL LOW (ref 39.0–52.0)
Hemoglobin: 12.6 g/dL — ABNORMAL LOW (ref 13.0–17.0)
Lymphocytes Relative: 40 % (ref 12–46)
Lymphs Abs: 2.8 10*3/uL (ref 0.7–4.0)
MCH: 27.6 pg (ref 26.0–34.0)
MCHC: 36.6 g/dL — ABNORMAL HIGH (ref 30.0–36.0)
MCV: 75.4 fL — ABNORMAL LOW (ref 78.0–100.0)
Monocytes Absolute: 0.5 10*3/uL (ref 0.1–1.0)
Monocytes Relative: 7 % (ref 3–12)
Neutro Abs: 3.6 10*3/uL (ref 1.7–7.7)
Neutrophils Relative %: 50 % (ref 43–77)
Platelets: 201 10*3/uL (ref 150–400)
RBC: 4.56 MIL/uL (ref 4.22–5.81)
RDW: 14.8 % (ref 11.5–15.5)
WBC: 7.2 10*3/uL (ref 4.0–10.5)

## 2013-03-29 LAB — COMPREHENSIVE METABOLIC PANEL
ALT: 34 U/L (ref 0–53)
AST: 35 U/L (ref 0–37)
Albumin: 3.5 g/dL (ref 3.5–5.2)
Alkaline Phosphatase: 61 U/L (ref 39–117)
BUN: 20 mg/dL (ref 6–23)
CO2: 23 mEq/L (ref 19–32)
Calcium: 9 mg/dL (ref 8.4–10.5)
Chloride: 106 mEq/L (ref 96–112)
Creatinine, Ser: 1.79 mg/dL — ABNORMAL HIGH (ref 0.50–1.35)
GFR calc Af Amer: 45 mL/min — ABNORMAL LOW (ref >90)
GFR calc non Af Amer: 39 mL/min — ABNORMAL LOW (ref >90)
Glucose, Bld: 137 mg/dL — ABNORMAL HIGH (ref 70–99)
Potassium: 3.8 mEq/L (ref 3.5–5.1)
Sodium: 136 mEq/L (ref 135–145)
Total Bilirubin: 0.6 mg/dL (ref 0.3–1.2)
Total Protein: 7.8 g/dL (ref 6.0–8.3)

## 2013-03-29 LAB — POCT I-STAT TROPONIN I: Troponin i, poc: 0 ng/mL (ref 0.00–0.08)

## 2013-03-29 IMAGING — CR DG CHEST 2V
2 series · 2 of 2 positions shown · non-contrast
Comparison: [DATE]

CLINICAL DATA: Right-sided chest pain

EXAM:
CHEST  2 VIEW

[w chest pa]
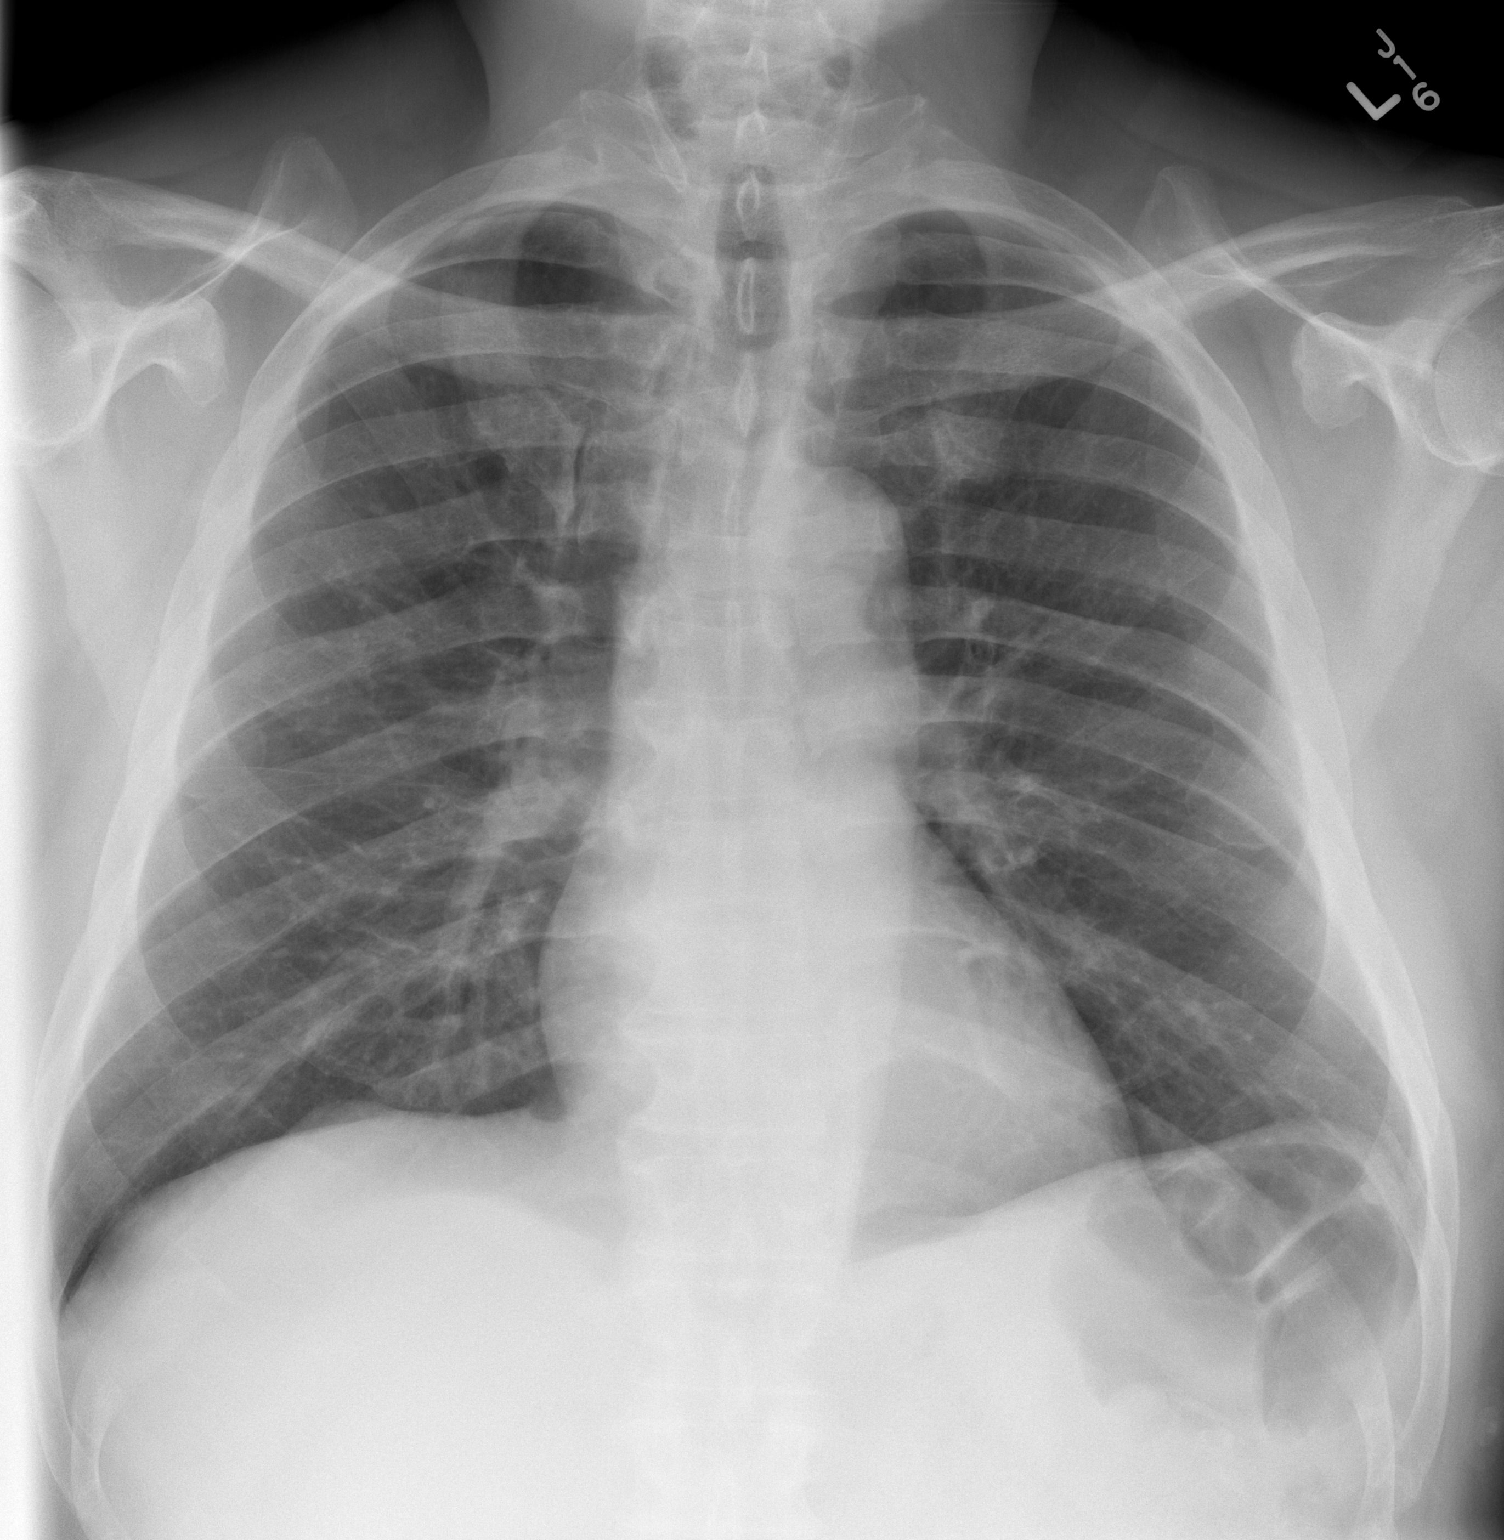

[w chest lat]
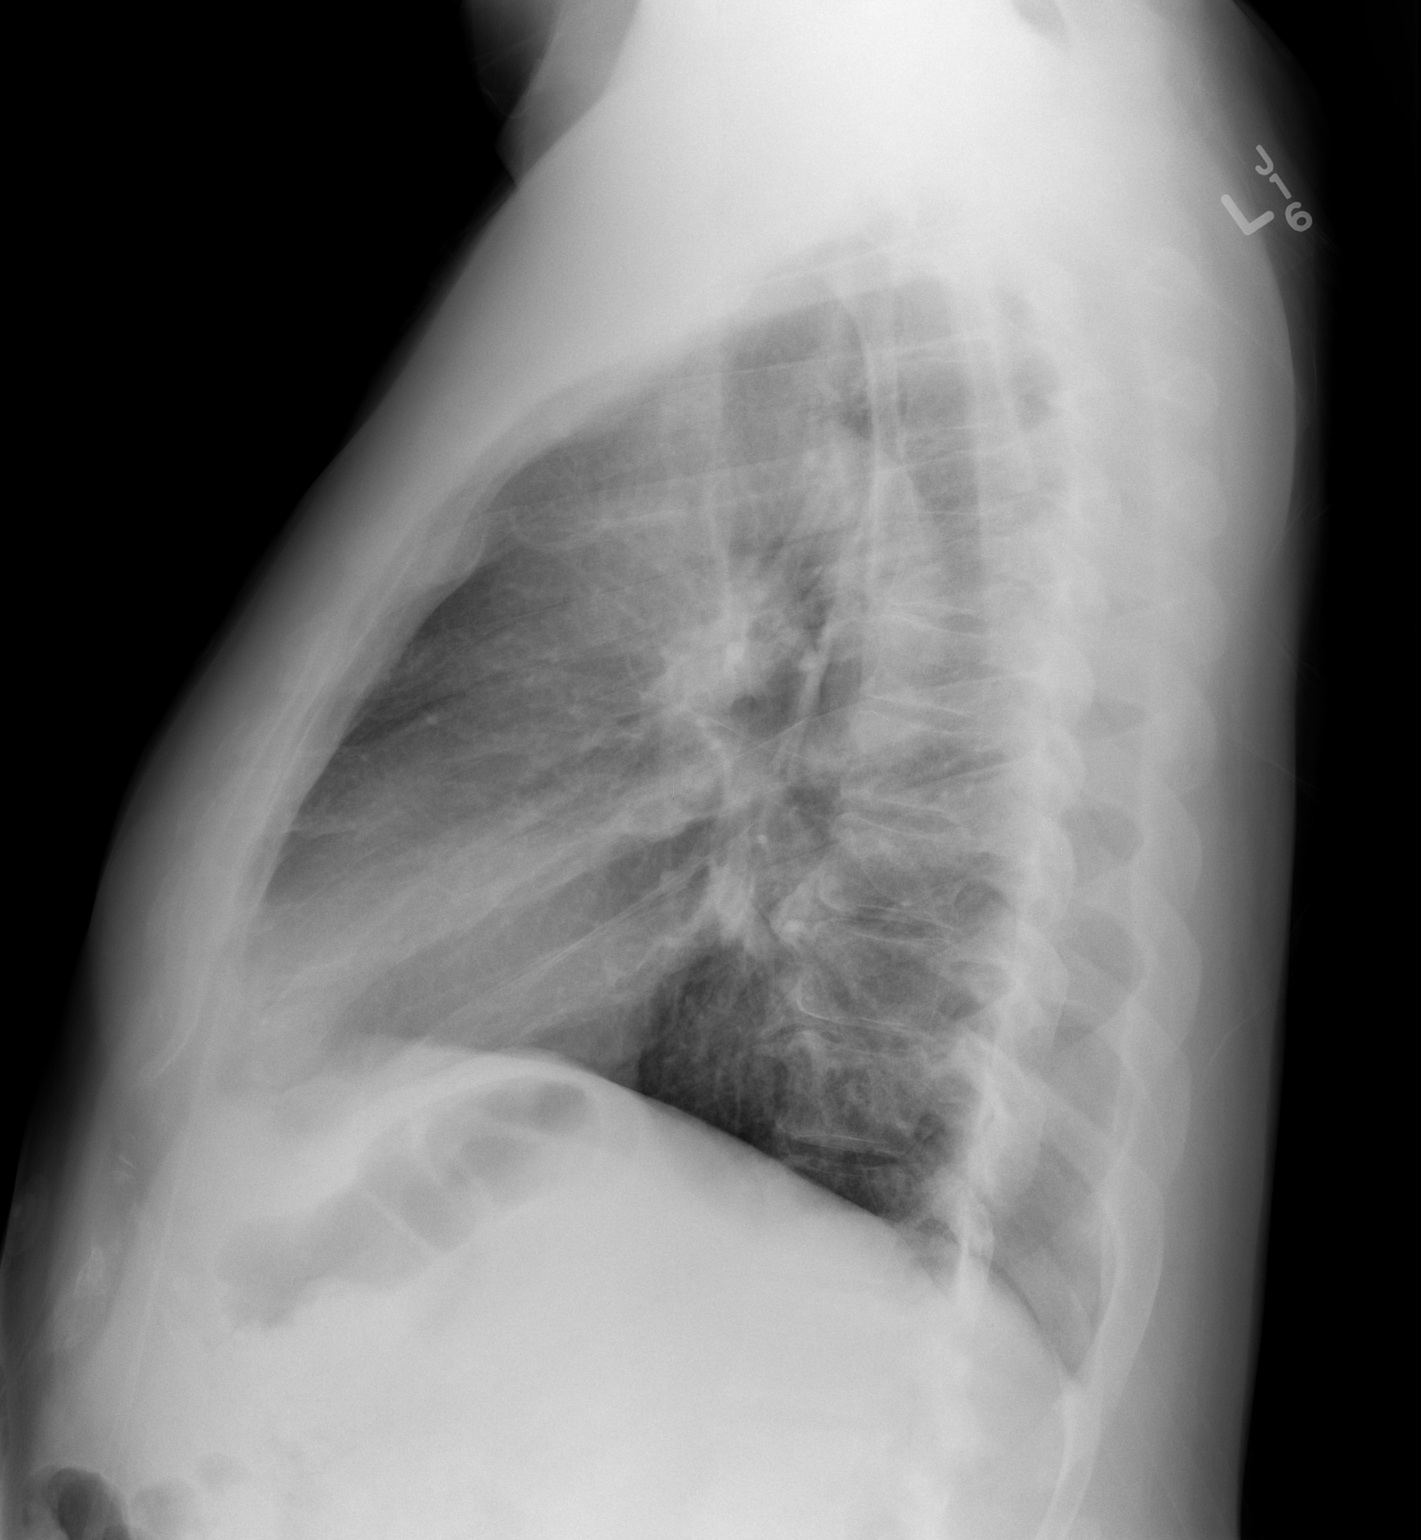

[2 of 2 positions shown; findings below may reference images not displayed]

FINDINGS: The heart size and mediastinal contours are within normal limits.
Both lungs are clear. The visualized skeletal structures are
unremarkable.
IMPRESSION: No active cardiopulmonary disease.

## 2013-03-29 NOTE — ED Notes (Signed)
Pt refused for second blood test and wanting to be discharged.

## 2013-03-29 NOTE — ED Provider Notes (Signed)
CSN: [id number]     Arrival date & time 03/29/13  1403 History   First MD Initiated Contact with Patient 03/29/13 1441     Chief Complaint  Patient presents with  . Chest Pain   (Consider location/radiation/quality/duration/timing/severity/associated sxs/prior Treatment) HPI [name]. Is a 62 year old male with past medical history of diabetes, hypertension, hyperlipidemia, history of colon cancer who presents the emergency department with chief complaint of right sided chest and shoulder pain.  Patient states that after dinner last night he began noticing some achy pain under his right armpit.  Patient states it is worse with movement of the arm.  He thought he might be having some heartburn and took 2 Maalox.  He states it was a couple last night but did not wake him from sleep.  Today his pain is the same and unchanged.  Patient was on his way to get fluids milk of magnesia when he decided to come the emergency department for evaluation.  He denies any diaphoresis, nausea, shortness of breath, he denies any pleuritic chest pain.  Patient states he did not have any increased physical exertion such as lifting heavy boxes lately.n His pain is non-exertional. Denies fevers, chills, myalgias, arthralgias. Denies dysuria, flank pain, suprapubic pain, frequency, urgency, or hematuria. Denies headaches, light headedness, weakness, visual disturbances. Denies abdominal pain, nausea, vomiting, diarrhea or constipation.   Past Medical History  Diagnosis Date  . Hyperlipidemia   . Hypertension     for a few years and resolved in 2013/2014.  Marland Kitchen Heart murmur   . Diabetes mellitus     type 2 IDDM x 15 years  . Sleep apnea     does not wear CPAP now  . 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 Arthritis     left knee  . 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 12/25/2011  . Chronic kidney disease     acute renal failure/injury requiring short-term  HD 2013  . 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 Colonoscopy    . Ganglion cyst excision  20 YRS AGO    RT ARM  . Partial colectomy  02/06/2012  . Partial colectomy  02/06/2012    Procedure: PARTIAL COLECTOMY;  Surgeon: Imogene Burn. [name], MD;  Location: [place];  Service: General;  Laterality: N/A;  right partial colectomy  . Colonoscopy  02/06/2012    Procedure: COLONOSCOPY;  Surgeon: Milus Banister, MD;  Location: [place];  Service: Endoscopy;;  . Laparotomy  02/16/2012    Procedure: EXPLORATORY LAPAROTOMY;  Surgeon: Zenovia Jarred, MD;  Location: [facility];  Service: General;  Laterality: N/A;  Exploratory Laparotomy with resection of anastomosis  . Ileostomy  02/16/2012    Procedure: ILEOSTOMY;  Surgeon: Zenovia Jarred, MD;  Location: [place];  Service: General;  Laterality: N/A;  . Application of wound vac  02/16/2012    Procedure: APPLICATION OF WOUND VAC;  Surgeon: Zenovia Jarred, MD;  Location: [place];  Service: General;  Laterality: N/A;  . Laparotomy  02/18/2012    Procedure: EXPLORATORY LAPAROTOMY;  Surgeon: Odis Hollingshead, MD;  Location: [place];  Service: General;  Laterality: N/A;  exploratory laparotomy  change of abdominal vac dressing  . Dressing change under anesthesia  02/18/2012    Procedure: DRESSING CHANGE UNDER ANESTHESIA;  Surgeon: Odis Hollingshead, MD;  Location: [place];  Service: General;  Laterality: N/A;  .  Laparotomy  02/20/2012    Procedure: EXPLORATORY LAPAROTOMY;  Surgeon: Odis Hollingshead, MD;  Location: [place];  Service: General;  Laterality: N/A;  . Vacuum assisted closure change  02/20/2012    Procedure: ABDOMINAL VACUUM ASSISTED CLOSURE CHANGE;  Surgeon: Odis Hollingshead, MD;  Location: [place];  Service: General;;  . Laparotomy  02/23/2012    Procedure: EXPLORATORY LAPAROTOMY;  Surgeon: Joyice Faster. Cornett, MD;  Location: [place];  Service: General;  Laterality: N/A;  Irrigation and Debridement of abdominal wound with wound vac  change with partial closure  . Incision and drainage of wound  02/23/2012    Procedure: IRRIGATION AND DEBRIDEMENT WOUND;  Surgeon: Joyice Faster. Cornett, MD;  Location: [place];  Service: General;  Laterality: N/A;  . Vacuum assisted closure change  02/23/2012    Procedure: ABDOMINAL VACUUM ASSISTED CLOSURE CHANGE;  Surgeon: Joyice Faster. Cornett, MD;  Location: [place];  Service: General;  Laterality: N/A;  . Laparotomy  02/26/2012    Procedure: EXPLORATORY LAPAROTOMY;  Surgeon: Imogene Burn. [name], MD;  Location: [place];  Service: General;  Laterality: N/A;     . Application of wound vac  02/26/2012    Procedure: APPLICATION OF WOUND VAC;  Surgeon: Imogene Burn. [name], MD;  Location: [place];  Service: General;  Laterality: N/A;  . Colostomy reversal    . Iliostomy    . Resection small bowel / closure ileostomy  402/2014    Dr [name]  . Ileostomy closure N/A 09/02/2012    Procedure: ILEOSTOMY REVERSAL;  Surgeon: Imogene Burn. [name], MD;  Location: [facility];  Service: General;  Laterality: N/A;   Family History  Problem Relation Age of Onset  . Diabetes Father   . Heart disease Father   . Colon cancer Neg Hx   . Diabetes Sister   . Heart disease Sister   . Heart disease Mother    History  Substance Use Topics  . Smoking status: Current Every Day Smoker -- 0.30 packs/day for 30 years    Types: Cigarettes  . Smokeless tobacco: Never Used  . Alcohol Use: Yes     Comment: Liquor twice monthly.    Review of Systems Ten systems reviewed and are negative for acute change, except as noted in the HPI.   Allergies  Review of patient's allergies indicates no known allergies.  Home Medications   Current Outpatient Rx  Name  Route  Sig  Dispense  Refill  . ferrous sulfate 325 (65 FE) MG tablet   Oral   Take 1 tablet (325 mg total) by mouth 2 (two) times daily.      3   . OVER THE COUNTER MEDICATION   Oral   Take 1 tablet by mouth daily as needed (over the counter heartburn relief medication).         .  sodium bicarbonate 650 MG tablet   Oral   Take 650 mg by mouth 2 (two) times daily.         . vitamin C (ASCORBIC ACID) 500 MG tablet   Oral   Take 500 mg by mouth daily.         Marland Kitchen amLODipine (NORVASC) 5 MG tablet   Oral   Take 1 tablet (5 mg total) by mouth daily.   30 tablet   1    BP 171/80  Pulse 60  Temp(Src) 98.6 F (37 C) (Oral)  Resp 20  SpO2 100% Physical Exam Physical Exam  Nursing note and vitals reviewed.  Constitutional: He appears well-developed and well-nourished. No distress.  HENT:  Head: Normocephalic and atraumatic.  Eyes: Conjunctivae normal are normal. No scleral icterus.  Neck: Normal range of motion. Neck supple.  Cardiovascular: Normal rate, regular rhythm and normal heart sounds.   Pulmonary/Chest: Effort normal and breath sounds normal. No respiratory distress.  Abdominal: Soft. There is no tenderness.  Musculoskeletal: He exhibits no edema. TTP R subscapilaris, TTP R infraspinatus and trapezius. Neurological: He is alert.  Skin: Skin is warm and dry. He is not diaphoretic.  Psychiatric: His behavior is normal.    ED Course  Procedures (including critical care time) Labs Review Labs Reviewed  CBC WITH DIFFERENTIAL - Abnormal; Notable for the following:    Hemoglobin 12.6 (*)    HCT 34.4 (*)    MCV 75.4 (*)    MCHC 36.6 (*)    All other components within normal limits  COMPREHENSIVE METABOLIC PANEL  POCT I-STAT TROPONIN I   Imaging Review Dg Chest 2 View  03/29/2013   CLINICAL DATA:  Right-sided chest pain  EXAM: CHEST  2 VIEW  COMPARISON:  04/13/2012  FINDINGS: The heart size and mediastinal contours are within normal limits. Both lungs are clear. The visualized skeletal structures are unremarkable.  IMPRESSION: No active cardiopulmonary disease.   Electronically Signed   By: Inez Catalina M.D.   On: 03/29/2013 14:40    EKG Interpretation   None        Date: 03/29/2013  Rate: 61  Rhythm: normal sinus rhythm  QRS Axis:  normal  Intervals: normal  ST/T Wave abnormalities: normal  Conduction Disutrbances:none  Narrative Interpretation:   Old EKG Reviewed: unchanged   MDM   1. Chest pain    4:14 PM BP 171/80  Pulse 60  Temp(Src) 98.6 F (37 C) (Oral)  Resp 20  SpO2 100% Patient here with right-sided axillary pain.  His workup initially is negative.  cmp is pending. I have discussed the case with Dr. Regenia Skeeter. Will repeat troponin at 5:30 PM/ If negative paitent may leave.  4:34 PM Filed Vitals:   03/29/13 1411  BP: 171/80  Pulse: 60  Temp: 98.6 F (37 C)  Resp: 20   Patient anxious to leave.  He states he is willing to sign AMA.  Papers.  He feels that this is noncardiac and does not wish to stay any longer.  I discussed the case with Dr. Regenia Skeeter.  We have discussed the case with the patient.  Our plan of care was to get a second troponin at 5:30 however feel that this is likely muscular.  We have explained to the patient notes second troponin would be more conservative care and feel safer to let him leave however the patient is demanding [place].  I discussed with the patient that it is possible that we may miss a heart attack due to the lack of second troponin.  Addendum a week to very bad outcomes including death.  The patient expresses his understanding and is competent to make these decisions.  I will not make him sign an AMA paper however he is strongly cautioned to return for any change in pain, pain in jaw pain, pain in arm. pressure, nausea, cold sweats, vomiting, epigastric pain.  The patient expresses understanding and agrees with plan of care  Margarita Mail, PA-C 03/29/13 1636

## 2013-03-29 NOTE — ED Provider Notes (Signed)
Medical screening examination/treatment/procedure(s) were conducted as a shared visit with non-physician practitioner(s) and myself.  I personally evaluated the patient during the encounter.  EKG Interpretation     Ventricular Rate:  61 PR Interval:  196 QRS Duration: 86 QT Interval:  382 QTC Calculation: 384 R Axis:   44 Text Interpretation:  Normal sinus rhythm Normal ECG No significant change since last tracing              Patient with benign EKG, and MSK sounding etiology of CP. He thinks its from overuse from fishing. Very atypical for ACS. Recommended second troponin but patient did not want to wait. I discussed risks of leaving but believe patient is reasonable and understands risks and will return if necessary. He will follow up with PCP for outpatient cardiac w/u.  Ephraim Hamburger, MD 03/29/13 1929

## 2013-03-29 NOTE — ED Notes (Signed)
Pt wanting to be discharged.Pt refused to stay for the second blood test.Pt explained the risk of going home without complete investigation for his chest pain.Dr and PA spoke to the pt.Vital signs stable and GCS 15.Discharge instruction given to pt.

## 2013-03-29 NOTE — ED Notes (Signed)
Pt is here with right sided chest pain that goes under his breast area.  Pt states he was told he looked like he was breathing harder.

## 2013-04-03 ENCOUNTER — Emergency Department (HOSPITAL_COMMUNITY): Payer: Medicaid Other

## 2013-04-03 ENCOUNTER — Encounter (HOSPITAL_COMMUNITY): Payer: Self-pay | Admitting: Emergency Medicine

## 2013-04-03 ENCOUNTER — Observation Stay (HOSPITAL_COMMUNITY)
Admission: EM | Admit: 2013-04-03 | Discharge: 2013-04-04 | Disposition: A | Discharge status: Home / Self Care | Payer: Medicaid Other | Attending: Internal Medicine | Admitting: Internal Medicine

## 2013-04-03 DIAGNOSIS — F17200 Nicotine dependence, unspecified, uncomplicated: Secondary | ICD-10-CM | POA: Insufficient documentation

## 2013-04-03 DIAGNOSIS — R0789 Other chest pain: Secondary | ICD-10-CM

## 2013-04-03 DIAGNOSIS — R079 Chest pain, unspecified: Secondary | ICD-10-CM | POA: Insufficient documentation

## 2013-04-03 DIAGNOSIS — E785 Hyperlipidemia, unspecified: Secondary | ICD-10-CM | POA: Diagnosis present

## 2013-04-03 DIAGNOSIS — Z79899 Other long term (current) drug therapy: Secondary | ICD-10-CM | POA: Insufficient documentation

## 2013-04-03 DIAGNOSIS — D509 Iron deficiency anemia, unspecified: Secondary | ICD-10-CM

## 2013-04-03 DIAGNOSIS — Z794 Long term (current) use of insulin: Secondary | ICD-10-CM | POA: Insufficient documentation

## 2013-04-03 DIAGNOSIS — R809 Proteinuria, unspecified: Secondary | ICD-10-CM | POA: Insufficient documentation

## 2013-04-03 DIAGNOSIS — C189 Malignant neoplasm of colon, unspecified: Secondary | ICD-10-CM | POA: Diagnosis present

## 2013-04-03 DIAGNOSIS — R011 Cardiac murmur, unspecified: Secondary | ICD-10-CM | POA: Insufficient documentation

## 2013-04-03 DIAGNOSIS — Z85038 Personal history of other malignant neoplasm of large intestine: Secondary | ICD-10-CM | POA: Insufficient documentation

## 2013-04-03 DIAGNOSIS — E8809 Other disorders of plasma-protein metabolism, not elsewhere classified: Secondary | ICD-10-CM

## 2013-04-03 DIAGNOSIS — E871 Hypo-osmolality and hyponatremia: Secondary | ICD-10-CM

## 2013-04-03 DIAGNOSIS — Z9049 Acquired absence of other specified parts of digestive tract: Secondary | ICD-10-CM | POA: Insufficient documentation

## 2013-04-03 DIAGNOSIS — B029 Zoster without complications: Principal | ICD-10-CM

## 2013-04-03 DIAGNOSIS — N183 Chronic kidney disease, stage 3 unspecified: Secondary | ICD-10-CM

## 2013-04-03 DIAGNOSIS — I129 Hypertensive chronic kidney disease with stage 1 through stage 4 chronic kidney disease, or unspecified chronic kidney disease: Secondary | ICD-10-CM

## 2013-04-03 DIAGNOSIS — G4733 Obstructive sleep apnea (adult) (pediatric): Secondary | ICD-10-CM | POA: Insufficient documentation

## 2013-04-03 DIAGNOSIS — I10 Essential (primary) hypertension: Secondary | ICD-10-CM

## 2013-04-03 DIAGNOSIS — E119 Type 2 diabetes mellitus without complications: Secondary | ICD-10-CM

## 2013-04-03 HISTORY — DX: Type 2 diabetes mellitus without complications: E11.9

## 2013-04-03 LAB — HEPATIC FUNCTION PANEL
ALT: 25 U/L (ref 0–53)
AST: 33 U/L (ref 0–37)
Albumin: 3.4 g/dL — ABNORMAL LOW (ref 3.5–5.2)
Alkaline Phosphatase: 62 U/L (ref 39–117)
Bilirubin, Direct: 0.2 mg/dL (ref 0.0–0.3)
Indirect Bilirubin: 0.6 mg/dL (ref 0.3–0.9)
Total Bilirubin: 0.8 mg/dL (ref 0.3–1.2)
Total Protein: 7.8 g/dL (ref 6.0–8.3)

## 2013-04-03 LAB — GLUCOSE, CAPILLARY
Glucose-Capillary: 140 mg/dL — ABNORMAL HIGH (ref 70–99)
Glucose-Capillary: 135 mg/dL — ABNORMAL HIGH (ref 70–99)

## 2013-04-03 LAB — URINALYSIS, ROUTINE W REFLEX MICROSCOPIC
Bilirubin Urine: NEGATIVE
Glucose, UA: NEGATIVE mg/dL
Ketones, ur: NEGATIVE mg/dL
Leukocytes, UA: NEGATIVE
Nitrite: NEGATIVE
Protein, ur: 100 mg/dL — AB
Specific Gravity, Urine: 1.016 (ref 1.005–1.030)
Urobilinogen, UA: 0.2 mg/dL (ref 0.0–1.0)
pH: 5.5 (ref 5.0–8.0)

## 2013-04-03 LAB — BASIC METABOLIC PANEL
BUN: 19 mg/dL (ref 6–23)
CO2: 22 mEq/L (ref 19–32)
Calcium: 8.5 mg/dL (ref 8.4–10.5)
Chloride: 99 mEq/L (ref 96–112)
Creatinine, Ser: 2 mg/dL — ABNORMAL HIGH (ref 0.50–1.35)
GFR calc Af Amer: 40 mL/min — ABNORMAL LOW (ref >90)
GFR calc non Af Amer: 34 mL/min — ABNORMAL LOW (ref >90)
Glucose, Bld: 184 mg/dL — ABNORMAL HIGH (ref 70–99)
Potassium: 3.8 mEq/L (ref 3.5–5.1)
Sodium: 131 mEq/L — ABNORMAL LOW (ref 135–145)

## 2013-04-03 LAB — CBC WITH DIFFERENTIAL/PLATELET
Basophils Absolute: 0 10*3/uL (ref 0.0–0.1)
Basophils Relative: 1 % (ref 0–1)
Eosinophils Absolute: 0 10*3/uL (ref 0.0–0.7)
Eosinophils Relative: 0 % (ref 0–5)
HCT: 35.8 % — ABNORMAL LOW (ref 39.0–52.0)
Hemoglobin: 12.9 g/dL — ABNORMAL LOW (ref 13.0–17.0)
Lymphocytes Relative: 30 % (ref 12–46)
Lymphs Abs: 2 10*3/uL (ref 0.7–4.0)
MCH: 27 pg (ref 26.0–34.0)
MCHC: 36 g/dL (ref 30.0–36.0)
MCV: 75.1 fL — ABNORMAL LOW (ref 78.0–100.0)
Monocytes Absolute: 0.6 10*3/uL (ref 0.1–1.0)
Monocytes Relative: 9 % (ref 3–12)
Neutro Abs: 4 10*3/uL (ref 1.7–7.7)
Neutrophils Relative %: 61 % (ref 43–77)
Platelets: 151 10*3/uL (ref 150–400)
RBC: 4.77 MIL/uL (ref 4.22–5.81)
RDW: 14.3 % (ref 11.5–15.5)
WBC: 6.5 10*3/uL (ref 4.0–10.5)

## 2013-04-03 LAB — CG4 I-STAT (LACTIC ACID): Lactic Acid, Venous: 1.71 mmol/L (ref 0.5–2.2)

## 2013-04-03 LAB — TROPONIN I: Troponin I: <0.3 ng/mL (ref <0.30)

## 2013-04-03 LAB — URINE MICROSCOPIC-ADD ON

## 2013-04-03 LAB — POCT I-STAT TROPONIN I: Troponin i, poc: 0.01 ng/mL (ref 0.00–0.08)

## 2013-04-03 IMAGING — CR DG CHEST 2V
2 series · 2 of 2 positions shown · non-contrast
Comparison: [DATE]

CLINICAL DATA: Left-sided chest pain and fever. Colon cancer
diagnosed last year.

EXAM:
CHEST  2 VIEW

[w chest pa]
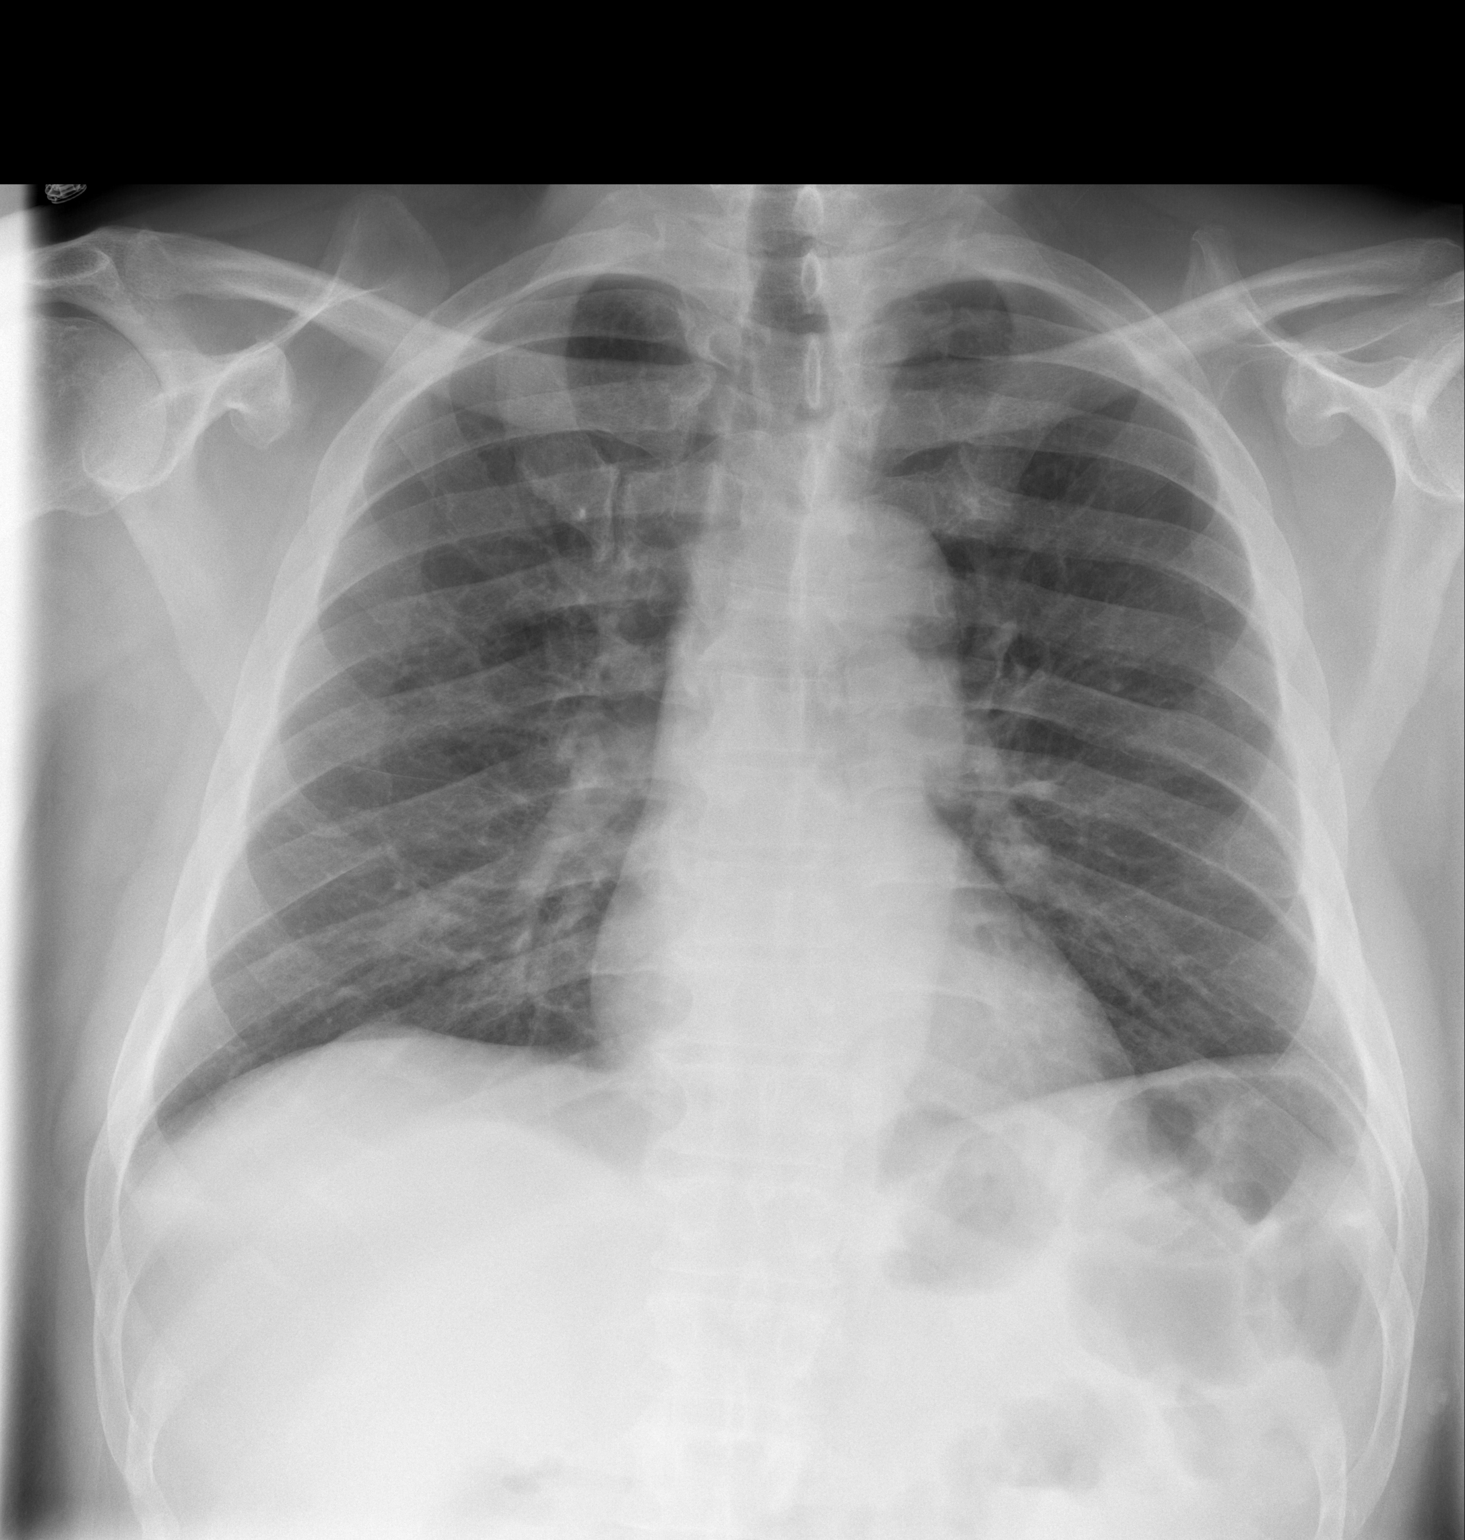

[w chest lat]
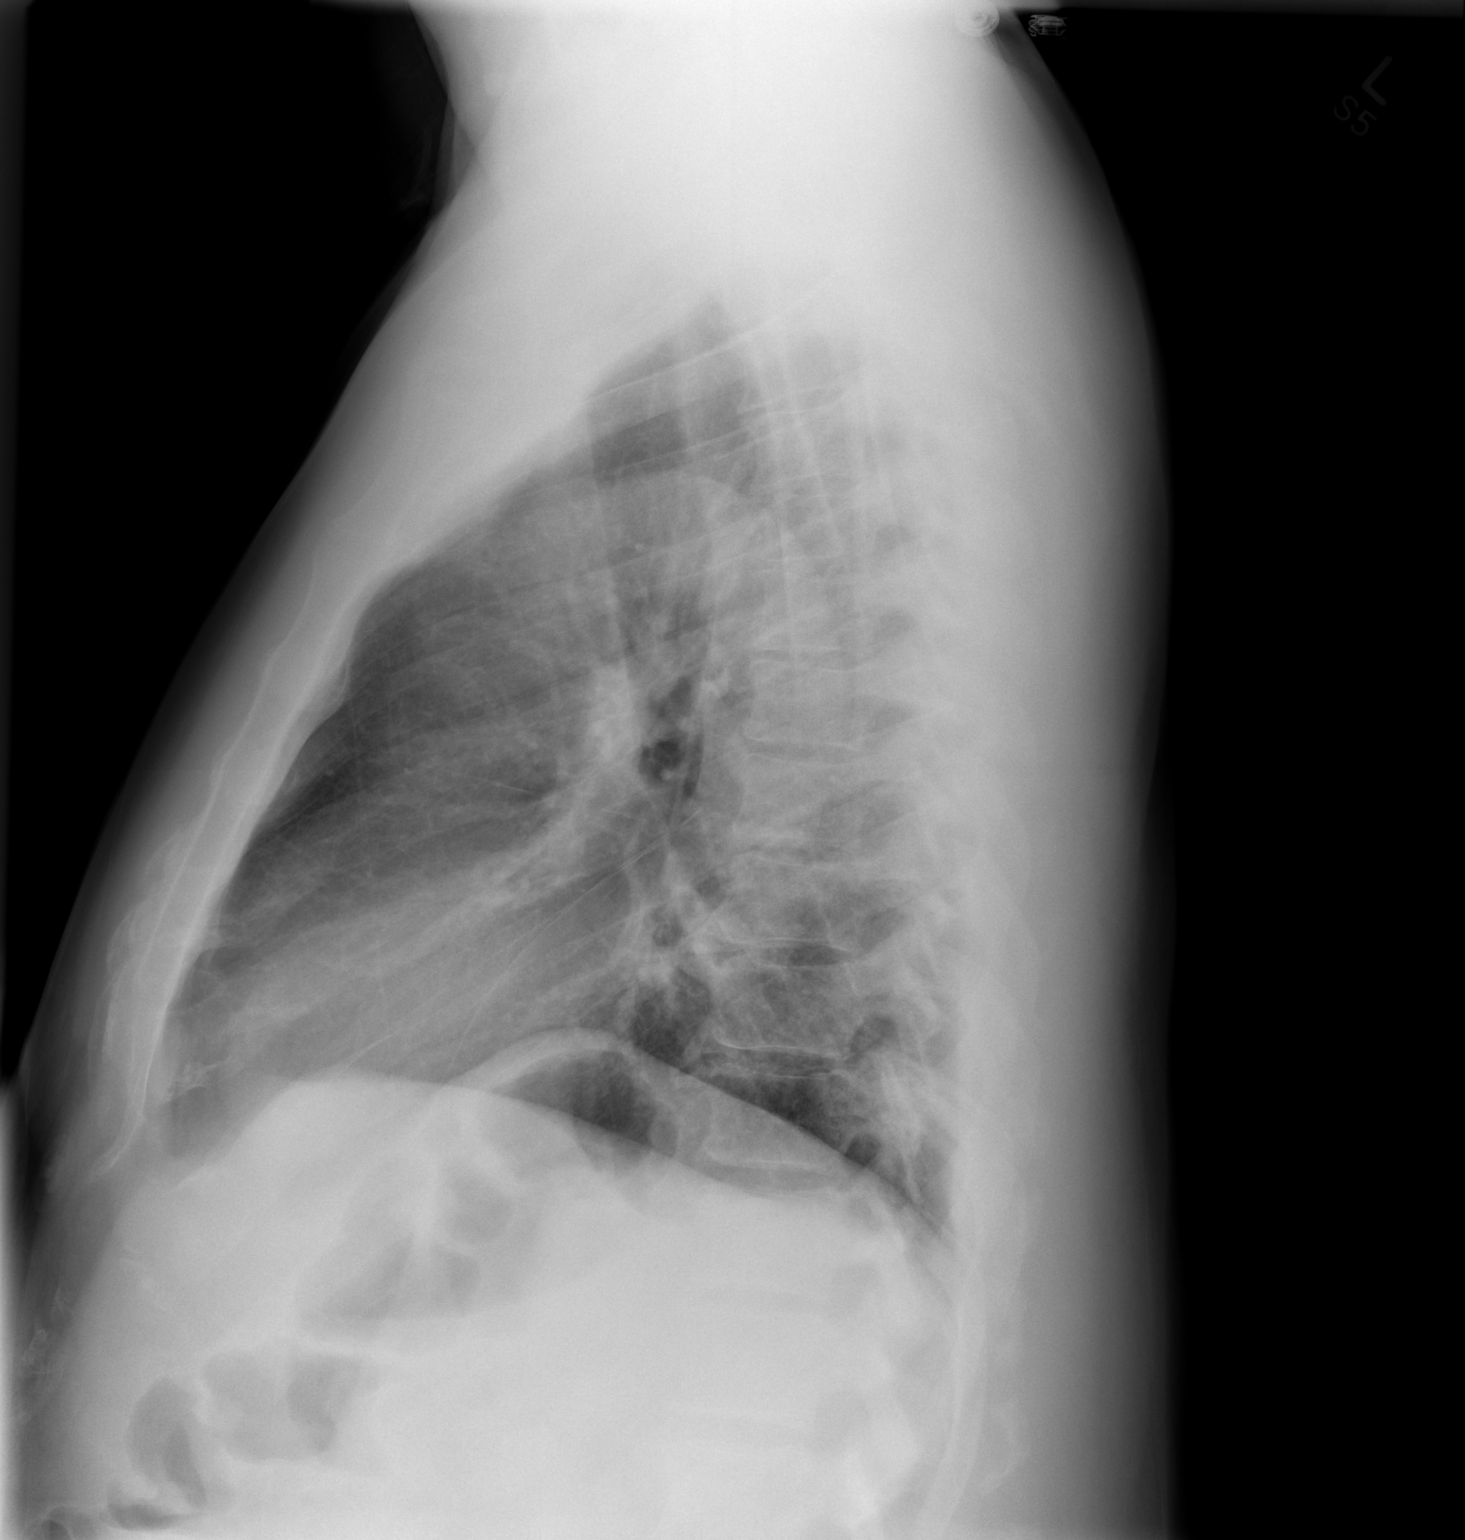

[2 of 2 positions shown; findings below may reference images not displayed]

FINDINGS: The heart size and mediastinal contours are within normal limits.
Both lungs are clear. The visualized skeletal structures are
unremarkable. Lungs are hypoaerated with crowding of the
bronchovascular markings.
IMPRESSION: No active cardiopulmonary disease.

## 2013-04-03 MED ORDER — MORPHINE SULFATE 2 MG/ML IJ SOLN: 2.0000 mg | INTRAMUSCULAR | Status: DC | PRN

## 2013-04-03 MED ORDER — MORPHINE SULFATE 4 MG/ML IJ SOLN
4.0000 mg | Freq: Once | INTRAMUSCULAR | Status: AC
  Administered 2013-04-03: 4 mg via INTRAVENOUS
  Filled 2013-04-03: qty 1

## 2013-04-03 MED ORDER — AMLODIPINE BESYLATE 5 MG PO TABS
5.0000 mg | ORAL_TABLET | Freq: Every day | ORAL | Status: DC
  Administered 2013-04-03 – 2013-04-04 (×2): 5 mg via ORAL
  Filled 2013-04-03 (×2): qty 1

## 2013-04-03 MED ORDER — INSULIN ASPART 100 UNIT/ML ~~LOC~~ SOLN
0.0000 [IU] | Freq: Three times a day (TID) | SUBCUTANEOUS | Status: DC
  Administered 2013-04-04 (×2): 2 [IU] via SUBCUTANEOUS

## 2013-04-03 MED ORDER — ACYCLOVIR 800 MG PO TABS
800.0000 mg | ORAL_TABLET | Freq: Every day | ORAL | Status: DC
  Administered 2013-04-03 – 2013-04-04 (×3): 800 mg via ORAL
  Filled 2013-04-03 (×7): qty 1

## 2013-04-03 MED ORDER — ACETAMINOPHEN 325 MG PO TABS: 650.0000 mg | ORAL_TABLET | Freq: Four times a day (QID) | ORAL | Status: DC | PRN

## 2013-04-03 MED ORDER — HEPARIN SODIUM (PORCINE) 5000 UNIT/ML IJ SOLN
5000.0000 [IU] | Freq: Three times a day (TID) | INTRAMUSCULAR | Status: DC
  Administered 2013-04-03 – 2013-04-04 (×2): 5000 [IU] via SUBCUTANEOUS
  Filled 2013-04-03 (×5): qty 1

## 2013-04-03 MED ORDER — FERROUS SULFATE 325 (65 FE) MG PO TABS
325.0000 mg | ORAL_TABLET | Freq: Two times a day (BID) | ORAL | Status: DC
  Administered 2013-04-03 – 2013-04-04 (×2): 325 mg via ORAL
  Filled 2013-04-03 (×3): qty 1

## 2013-04-03 MED ORDER — ONDANSETRON HCL 4 MG/2ML IJ SOLN: 4.0000 mg | Freq: Four times a day (QID) | INTRAMUSCULAR | Status: DC | PRN

## 2013-04-03 MED ORDER — VITAMIN C 500 MG PO TABS
500.0000 mg | ORAL_TABLET | Freq: Every day | ORAL | Status: DC
  Administered 2013-04-04: 500 mg via ORAL
  Filled 2013-04-03: qty 1

## 2013-04-03 MED ORDER — TRAMADOL HCL 50 MG PO TABS
100.0000 mg | ORAL_TABLET | Freq: Four times a day (QID) | ORAL | Status: DC | PRN
  Administered 2013-04-03: 100 mg via ORAL
  Filled 2013-04-03: qty 2

## 2013-04-03 MED ORDER — SODIUM CHLORIDE 0.9 % IV BOLUS (SEPSIS)
1000.0000 mL | Freq: Once | INTRAVENOUS | Status: AC
  Administered 2013-04-03: 1000 mL via INTRAVENOUS

## 2013-04-03 MED ORDER — GABAPENTIN 100 MG PO CAPS
100.0000 mg | ORAL_CAPSULE | Freq: Three times a day (TID) | ORAL | Status: DC | PRN
  Filled 2013-04-03: qty 1

## 2013-04-03 MED ORDER — ASPIRIN 81 MG PO CHEW
324.0000 mg | CHEWABLE_TABLET | Freq: Once | ORAL | Status: AC
  Administered 2013-04-03: 324 mg via ORAL
  Filled 2013-04-03: qty 4

## 2013-04-03 MED ORDER — SODIUM BICARBONATE 650 MG PO TABS
650.0000 mg | ORAL_TABLET | Freq: Two times a day (BID) | ORAL | Status: DC
  Administered 2013-04-03 – 2013-04-04 (×2): 650 mg via ORAL
  Filled 2013-04-03 (×3): qty 1

## 2013-04-03 NOTE — H&P (Signed)
Date: 04/03/2013               Patient Name:  [name]. MRN: [id number]  DOB: [birth year] Age / Sex: 62 y.o., male   PCP: [name], MD         Medical Service: Internal Medicine Teaching Service         Attending Physician: Dr. Larey Dresser     First Contact: Dr. Naaman Plummer Pager: [phone]  Second Contact: Dr. Algis Liming Pager: [phone]       After Hours (After 5p/  First Contact Pager: [phone]  weekends / holidays): Second Contact Pager: [phone]   Chief Complaint: chest pain   History of Present Illness:   [name] is a 62 year old with past medical history of HTN, HL, insulin-dependent T2DM, OSA, colon cancer s/p partial colectomy 2013 who presents with left sided chest pain that began less than 24 hours ago. Patient reports that he initially had right sided chest pain with radiation to his right shoulder and arm that he atrributed to fishing. He was seen in the ED on 10/27 and found to have negative troponin and nonischemic changes on 12-lead EKG that was non-concerning for cardiac chest pain. He then noticed a rash that developed on the right side of his back, armpit and right front chest after he being seen in the ED on 10/27 that has been pruritic in nature without sensory change.Marland Kitchen He placed icy patch on it with some relief. Last night he began having left sided 5/10 chest pain without radiation that felt like something was sticking out his chest. Pain is worse if he presses on his chest. He denies dyspnea, diaphoresis, or palpitations. He has never had similar chest pain in the past. He also reports fatigue, chills, and decreased appetite for the last 3-4 days. He states he never had chicken pox as a child and has not been anyone lately that has had chicken pox. He reports he has not taken any of his medications for 1 year due to inability to afford them.       Meds: No current facility-administered medications for this encounter.   Current Outpatient  Prescriptions  Medication Sig Dispense Refill  . amLODipine (NORVASC) 5 MG tablet Take 1 tablet (5 mg total) by mouth daily.  30 tablet  1  . ferrous sulfate 325 (65 FE) MG tablet Take 1 tablet (325 mg total) by mouth 2 (two) times daily.    3  . OVER THE COUNTER MEDICATION Take 1 tablet by mouth daily as needed (over the counter heartburn relief medication).      . sodium bicarbonate 650 MG tablet Take 650 mg by mouth 2 (two) times daily.      . vitamin C (ASCORBIC ACID) 500 MG tablet Take 500 mg by mouth daily.        Allergies: Allergies as of 04/03/2013  . (No Known Allergies)   Past Medical History  Diagnosis Date  . Hyperlipidemia   . Hypertension     for a few years and resolved in 2013/2014.  Marland Kitchen Heart murmur   . Diabetes mellitus     type 2 IDDM x 15 years  . Sleep apnea     does not wear CPAP now  . 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 Arthritis     left knee  . 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 12/25/2011  . Chronic kidney disease  acute renal failure/injury requiring short-term HD 2013  . 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 Colonoscopy    . Ganglion cyst excision  20 YRS AGO    RT ARM  . Partial colectomy  02/06/2012  . Partial colectomy  02/06/2012    Procedure: PARTIAL COLECTOMY;  Surgeon: Imogene Burn. [name], MD;  Location: [place];  Service: General;  Laterality: N/A;  right partial colectomy  . Colonoscopy  02/06/2012    Procedure: COLONOSCOPY;  Surgeon: Milus Banister, MD;  Location: [place];  Service: Endoscopy;;  . Laparotomy  02/16/2012    Procedure: EXPLORATORY LAPAROTOMY;  Surgeon: Zenovia Jarred, MD;  Location: [place];  Service: General;  Laterality: N/A;  Exploratory Laparotomy with resection of anastomosis  . Ileostomy  02/16/2012    Procedure: ILEOSTOMY;  Surgeon: Zenovia Jarred, MD;  Location: [place];  Service: General;  Laterality: N/A;  . Application of  wound vac  02/16/2012    Procedure: APPLICATION OF WOUND VAC;  Surgeon: Zenovia Jarred, MD;  Location: [place];  Service: General;  Laterality: N/A;  . Laparotomy  02/18/2012    Procedure: EXPLORATORY LAPAROTOMY;  Surgeon: Odis Hollingshead, MD;  Location: [place];  Service: General;  Laterality: N/A;  exploratory laparotomy  change of abdominal vac dressing  . Dressing change under anesthesia  02/18/2012    Procedure: DRESSING CHANGE UNDER ANESTHESIA;  Surgeon: Odis Hollingshead, MD;  Location: [place];  Service: General;  Laterality: N/A;  . Laparotomy  02/20/2012    Procedure: EXPLORATORY LAPAROTOMY;  Surgeon: Odis Hollingshead, MD;  Location: [place];  Service: General;  Laterality: N/A;  . Vacuum assisted closure change  02/20/2012    Procedure: ABDOMINAL VACUUM ASSISTED CLOSURE CHANGE;  Surgeon: Odis Hollingshead, MD;  Location: [place];  Service: General;;  . Laparotomy  02/23/2012    Procedure: EXPLORATORY LAPAROTOMY;  Surgeon: Joyice Faster. Cornett, MD;  Location: [place];  Service: General;  Laterality: N/A;  Irrigation and Debridement of abdominal wound with wound vac change with partial closure  . Incision and drainage of wound  02/23/2012    Procedure: IRRIGATION AND DEBRIDEMENT WOUND;  Surgeon: Joyice Faster. Cornett, MD;  Location: [place];  Service: General;  Laterality: N/A;  . Vacuum assisted closure change  02/23/2012    Procedure: ABDOMINAL VACUUM ASSISTED CLOSURE CHANGE;  Surgeon: Joyice Faster. Cornett, MD;  Location: [place];  Service: General;  Laterality: N/A;  . Laparotomy  02/26/2012    Procedure: EXPLORATORY LAPAROTOMY;  Surgeon: Imogene Burn. [name], MD;  Location: [place];  Service: General;  Laterality: N/A;     . Application of wound vac  02/26/2012    Procedure: APPLICATION OF WOUND VAC;  Surgeon: Imogene Burn. [name], MD;  Location: [place];  Service: General;  Laterality: N/A;  . Colostomy reversal    . Iliostomy    . Resection small bowel / closure ileostomy  402/2014    Dr [name]  . Ileostomy closure N/A  09/02/2012    Procedure: ILEOSTOMY REVERSAL;  Surgeon: Imogene Burn. [name], MD;  Location: [place] [facility];  Service: General;  Laterality: N/A;   Family History  Problem Relation Age of Onset  . Diabetes Father   . Heart disease Father   . Colon cancer Neg Hx   . Diabetes Sister   . Heart disease Sister   . Heart disease Mother    History   Social History  . Marital Status:  Divorced    Spouse Name: N/A    Number of Children: 3  . Years of Education: N/A   Occupational History  .      Worked at [employer]  . Smoking status: Current Every Day Smoker -- 0.30 packs/day for 30 years    Types: Cigarettes  . Smokeless tobacco: Never Used  . Alcohol Use: Yes     Comment: Liquor twice monthly.  . Drug Use: No  . Sexual Activity: Not on file   Other Topics Concern  . Not on file   Social History Narrative   Financial assistance approved for 100% discount at [facility] and has [facility] card   [employer]  August 08, 2009 5:48 PM   *PATIENT WAS GIVEN DM CARD.Lela Sturdivant NT II  June 08, 2010 3:56 PM      Divorced, one daughter currently staying with him   Has 7 grand children   Reports disability and medicaid is pending    Review of Systems: Review of Systems  Constitutional: Positive for chills and malaise/fatigue. Negative for diaphoresis.  HENT: Positive for congestion (at baseline). Negative for ear pain.   Eyes: Positive for blurred vision. Negative for pain.  Respiratory: Positive for cough (at baseline).   Cardiovascular: Positive for chest pain. Negative for palpitations and leg swelling.  Gastrointestinal: Negative for nausea, vomiting, abdominal pain, diarrhea, constipation and blood in stool.  Genitourinary: Negative for dysuria, urgency and frequency.  Skin: Positive for itching and rash.  Neurological: Positive for weakness. Negative for dizziness and headaches.    Physical Exam: Blood pressure 176/86, pulse 66, temperature 99.6 F (37.6 C),  temperature source Oral, resp. rate 28, SpO2 96.00%. Physical Exam  Constitutional: He is oriented to person, place, and time. He appears well-developed and well-nourished. No distress.  HENT:  Head: Normocephalic and atraumatic.  Right Ear: External ear normal.  Left Ear: External ear normal.  Nose: Nose normal.  Mouth/Throat: Oropharynx is clear and moist. No oropharyngeal exudate.  Eyes: EOM are normal. Right eye exhibits no discharge. Left eye exhibits no discharge.  Neck: Normal range of motion. Neck supple.  Cardiovascular: Normal rate and regular rhythm.   Murmur heard. Pulmonary/Chest: Effort normal and breath sounds normal. No respiratory distress. He has no wheezes. He has no rales. He exhibits tenderness (left sided pectoralis muscle tendereness to palpation).  Abdominal: Soft. Bowel sounds are normal. He exhibits no distension. There is no tenderness. There is no rebound and no guarding.  Musculoskeletal: Normal range of motion. He exhibits no edema and no tenderness.  Neurological: He is alert and oriented to person, place, and time.  Skin: Skin is warm and dry. Rash (non-erupted fluid flilled vesicles on right chest extending into armpit with healed crusted lesion on back wiith T4 dermatiome distribution ) noted. He is not diaphoretic. No erythema. No pallor.  Psychiatric: He has a normal mood and affect. His behavior is normal.    Lab results: Basic Metabolic Panel:  Recent Labs  04/03/13 1055  NA 131*  K 3.8  CL 99  CO2 22  GLUCOSE 184*  BUN 19  CREATININE 2.00*  CALCIUM 8.5   Liver Function Tests:  Recent Labs  04/03/13 1055  AST 33  ALT 25  ALKPHOS 62  BILITOT 0.8  PROT 7.8  ALBUMIN 3.4*   No results found for this basename: LIPASE, AMYLASE,  in the last 72 hours No results found for this basename: AMMONIA,  in the last 72 hours CBC:  Recent Labs  04/03/13 1055  WBC 6.5  NEUTROABS 4.0  HGB 12.9*  HCT 35.8*  MCV 75.1*  PLT 151   Cardiac  Enzymes: No results found for this basename: CKTOTAL, CKMB, CKMBINDEX, TROPONINI,  in the last 72 hours BNP: No results found for this basename: PROBNP,  in the last 72 hours D-Dimer: No results found for this basename: DDIMER,  in the last 72 hours CBG: No results found for this basename: GLUCAP,  in the last 72 hours Hemoglobin A1C: No results found for this basename: HGBA1C,  in the last 72 hours Fasting Lipid Panel: No results found for this basename: CHOL, HDL, LDLCALC, TRIG, CHOLHDL, LDLDIRECT,  in the last 72 hours Thyroid Function Tests: No results found for this basename: TSH, T4TOTAL, FREET4, T3FREE, THYROIDAB,  in the last 72 hours Anemia Panel: No results found for this basename: VITAMINB12, FOLATE, FERRITIN, TIBC, IRON, RETICCTPCT,  in the last 72 hours Coagulation: No results found for this basename: LABPROT, INR,  in the last 72 hours Urine Drug Screen: Drugs of Abuse  No results found for this basename: labopia, cocainscrnur, labbenz, amphetmu, thcu, labbarb    Alcohol Level: No results found for this basename: ETH,  in the last 72 hours Urinalysis:  Recent Labs  04/03/13 1252  COLORURINE YELLOW  LABSPEC 1.016  PHURINE 5.5  GLUCOSEU NEGATIVE  HGBUR TRACE*  BILIRUBINUR NEGATIVE  KETONESUR NEGATIVE  PROTEINUR 100*  UROBILINOGEN 0.2  NITRITE NEGATIVE  LEUKOCYTESUR NEGATIVE   Misc. Labs:   Imaging results:  Dg Chest 2 View  04/03/2013   CLINICAL DATA:  Left-sided chest pain and fever. Colon cancer diagnosed last year.  EXAM: CHEST  2 VIEW  COMPARISON:  03/29/2013  FINDINGS: The heart size and mediastinal contours are within normal limits. Both lungs are clear. The visualized skeletal structures are unremarkable. Lungs are hypoaerated with crowding of the bronchovascular markings.  IMPRESSION: No active cardiopulmonary disease.   Electronically Signed   By: Conchita Paris M.D.   On: 04/03/2013 10:45    Other results:  EKG:  Ventricular Rate: 76  PR  Interval: 172  QRS Duration: 84  QT Interval: 368  QTC Calculation: 414  R Axis: 43  Text Interpretation: Normal sinus rhythm Normal ECG No significant change since last tracing   Assessment & Plan by Problem: Principal Problem:   Herpes zoster infection of thoracic region Active Problems:   Hyperlipidemia LDL goal <70   OBSTRUCTIVE SLEEP APNEA   Colon cancer, status Post resection    CKD    HYPERTENSION   Chest pain  Assessment:  62 year old with past medical history of HTN, HL, insulin-dependent T2DM, OSA, colon cancer s/p partial colectomy 2013 who presents with left sided chest pain that began less than 24 hours ago and found to have herpetic lesions consistent with herpes zoster infection.   Atypical Chest Pain - Patient most likely with referred pain from T4 involved dermatome herpes zoster infection. He has reproducible left sided pectoralis chest pain with palpation on physical exam. Troponin and 12-lead EKG did not reveal ischemic changes suggesting cardiac chest pain. However he does have ACS risk factors (DM, HTN, HL, smoker) so ACS rule out indicated.  -Oxygen therapy as need to keep SpO2>92% -Aspirin 325 mg daily  -Morphine PRN for pain, zofran PRN for nausea -Cycle Troponin  -EKG for recurrent CP  Herpes Zoster Infection - Patient with new herpetic vesicles on right side of front chest, armpit,  and healing lesions on back in dermatomal distrubution (T4). Patient with history of colon cancer s/p partial colectomy in 2013 without chemotherapy or radiation. He has diabetes mellitus but not taking any of medications for 1 year. Thus he may have component of immunocompromise. No significant neuropathic pain or evidence of eye or ear lesions.       -Start acyclovir renally dosed for 7-10 days -Start gabapentin 100 mg PRN neuropathic pain -Tylenol, tramadol PRN pain - Zoster vaccine recommended after treatment with acyclovir therapy   Hypertension - currently hypertensive  without evidence of end organ damage -Continue home amlodipine 5 mg daily   Hypotonic Hyponatremia - Patient with sodium level of 131 on admission and calculated osmolarity of 278. Most likely hypovolemic hyponatremia due to decreased PO intake. He received 1L bolus in ED. -Monitor BMP  -Carb modified diet   Hypoalbuminemia - Patient with mildly low albumin (3.4) on admission most likely due malnutrition -Carb modified diet -Encourage adequate nutrition   Insulin dependent Diabetes Mellitus - Patient with glucose of 184 on admission with AG 10. UA without ketonuria.  -Insulin sliding scale  -Glucose monitoring   Chronic Kidney Disease Stage III - Patient with Cr of 2 on admission near baseline 2.2 with mild proteinuria (<100) Most likely due to diabetic nephropathy.  -Sodium bicarbonate 650 mg BID   Iron deficiency Anemia - Hg 12.9 above baseline (9-10). Most likely due to colon cancer s/p resection. No reports of recent GI bleeding.   -Continue ferrous sulfate 325 mg daily  -Continue vitamin C 500 mg daily   Diet: Carb modified  DVT PPx Heparin SQ TID  Code: Full   Dispo: Disposition is deferred at this time, awaiting improvement of current medical problems. Anticipated discharge in approximately 1 day(s).   The patient does have a current PCP [name], MD) and does need an [facility] hospital follow-up appointment after discharge.  The patient does have transportation limitations that hinder transportation to clinic appointments.  Signed: Juluis Mire, MD 04/03/2013, 1:24 PM

## 2013-04-03 NOTE — ED Notes (Signed)
Pt reports being seen here earlier this week for same. Was having right side chest pain but now its left side chest pain and generalized fatigue/weakness. Denies sob. ekg done at triage.

## 2013-04-03 NOTE — ED Provider Notes (Signed)
CSN: [id number]     Arrival date & time 04/03/13  0840 History   First MD Initiated Contact with Patient 04/03/13 [id number]     Chief Complaint  Patient presents with  . Chest Pain   (Consider location/radiation/quality/duration/timing/severity/associated sxs/prior Treatment) HPI Comments: 62 year old male presents with left-sided chest pain. He was seen here a few days ago for right-sided chest pain which he says is getting a little better. He denies any shortness of breath. He has been feeling some chills and generalized weakness over the last 2 days. He states she's been barely able to get out of bed has been lying around. He denies any cough. He states that he noticed a rash on his chest on the right side as well as back. He states that the left chest pain feels similar to that but is currently gone. He denies any exertional shortness of breath.   Past Medical History  Diagnosis Date  . Hyperlipidemia   . Hypertension     for a few years and resolved in 2013/2014.  Marland Kitchen Heart murmur   . Diabetes mellitus     type 2 IDDM x 15 years  . Sleep apnea     does not wear CPAP now  . 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 Arthritis     left knee  . 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 12/25/2011  . Chronic kidney disease     acute renal failure/injury requiring short-term HD 2013  . 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 Colonoscopy    . Ganglion cyst excision  20 YRS AGO    RT ARM  . Partial colectomy  02/06/2012  . Partial colectomy  02/06/2012    Procedure: PARTIAL COLECTOMY;  Surgeon: Imogene Burn. [name], MD;  Location: [place];  Service: General;  Laterality: N/A;  right partial colectomy  . Colonoscopy  02/06/2012    Procedure: COLONOSCOPY;  Surgeon: Milus Banister, MD;  Location: [place];  Service: Endoscopy;;  . Laparotomy  02/16/2012    Procedure: EXPLORATORY LAPAROTOMY;  Surgeon: Zenovia Jarred,  MD;  Location: [place];  Service: General;  Laterality: N/A;  Exploratory Laparotomy with resection of anastomosis  . Ileostomy  02/16/2012    Procedure: ILEOSTOMY;  Surgeon: Zenovia Jarred, MD;  Location: [place];  Service: General;  Laterality: N/A;  . Application of wound vac  02/16/2012    Procedure: APPLICATION OF WOUND VAC;  Surgeon: Zenovia Jarred, MD;  Location: [place];  Service: General;  Laterality: N/A;  . Laparotomy  02/18/2012    Procedure: EXPLORATORY LAPAROTOMY;  Surgeon: Odis Hollingshead, MD;  Location: [place];  Service: General;  Laterality: N/A;  exploratory laparotomy  change of abdominal vac dressing  . Dressing change under anesthesia  02/18/2012    Procedure: DRESSING CHANGE UNDER ANESTHESIA;  Surgeon: Odis Hollingshead, MD;  Location: [place];  Service: General;  Laterality: N/A;  . Laparotomy  02/20/2012    Procedure: EXPLORATORY LAPAROTOMY;  Surgeon: Odis Hollingshead, MD;  Location: [place];  Service: General;  Laterality: N/A;  . Vacuum assisted closure change  02/20/2012    Procedure: ABDOMINAL VACUUM ASSISTED CLOSURE CHANGE;  Surgeon: Odis Hollingshead, MD;  Location: [place];  Service: General;;  . Laparotomy  02/23/2012    Procedure: EXPLORATORY LAPAROTOMY;  Surgeon: Joyice Faster. Cornett, MD;  Location: [place];  Service: General;  Laterality: N/A;  Irrigation and Debridement of abdominal wound with wound vac change with partial closure  . Incision and drainage of wound  02/23/2012    Procedure: IRRIGATION AND DEBRIDEMENT WOUND;  Surgeon: Joyice Faster. Cornett, MD;  Location: [place];  Service: General;  Laterality: N/A;  . Vacuum assisted closure change  02/23/2012    Procedure: ABDOMINAL VACUUM ASSISTED CLOSURE CHANGE;  Surgeon: Joyice Faster. Cornett, MD;  Location: [place];  Service: General;  Laterality: N/A;  . Laparotomy  02/26/2012    Procedure: EXPLORATORY LAPAROTOMY;  Surgeon: Imogene Burn. [name], MD;  Location: [place];  Service: General;  Laterality: N/A;     . Application of wound vac   02/26/2012    Procedure: APPLICATION OF WOUND VAC;  Surgeon: Imogene Burn. [name], MD;  Location: [name];  Service: General;  Laterality: N/A;  . Colostomy reversal    . Iliostomy    . Resection small bowel / closure ileostomy  402/2014    Dr [name]  . Ileostomy closure N/A 09/02/2012    Procedure: ILEOSTOMY REVERSAL;  Surgeon: Imogene Burn. [name], MD;  Location: [place] [facility];  Service: General;  Laterality: N/A;   Family History  Problem Relation Age of Onset  . Diabetes Father   . Heart disease Father   . Colon cancer Neg Hx   . Diabetes Sister   . Heart disease Sister   . Heart disease Mother    History  Substance Use Topics  . Smoking status: Current Every Day Smoker -- 0.30 packs/day for 30 years    Types: Cigarettes  . Smokeless tobacco: Never Used  . Alcohol Use: Yes     Comment: Liquor twice monthly.    Review of Systems  Constitutional: Positive for fever (noticed when his temp was 100.8 in triage) and chills.  Respiratory: Negative for shortness of breath.   Cardiovascular: Positive for chest pain.  Gastrointestinal: Negative for vomiting, abdominal pain and diarrhea.  Musculoskeletal: Positive for back pain.  Skin: Positive for rash.  Neurological: Positive for weakness.    Allergies  Review of patient's allergies indicates no known allergies.  Home Medications   Current Outpatient Rx  Name  Route  Sig  Dispense  Refill  . amLODipine (NORVASC) 5 MG tablet   Oral   Take 1 tablet (5 mg total) by mouth daily.   30 tablet   1   . ferrous sulfate 325 (65 FE) MG tablet   Oral   Take 1 tablet (325 mg total) by mouth 2 (two) times daily.      3   . OVER THE COUNTER MEDICATION   Oral   Take 1 tablet by mouth daily as needed (over the counter heartburn relief medication).         . sodium bicarbonate 650 MG tablet   Oral   Take 650 mg by mouth 2 (two) times daily.         . vitamin C (ASCORBIC ACID) 500 MG tablet   Oral   Take 500 mg by mouth daily.           BP 181/85  Temp(Src) 100.8 F (38.2 C) (Oral)  Resp 18  SpO2 97% Physical Exam  Nursing note and vitals reviewed. Constitutional: He is oriented to person, place, and time. He appears well-developed and well-nourished.  HENT:  Head: Normocephalic and atraumatic.  Right Ear: External ear normal.  Left Ear: External ear normal.  Nose: Nose normal.  Eyes: Right eye exhibits no discharge. Left  eye exhibits no discharge.  Neck: Neck supple.  Cardiovascular: Normal rate, regular rhythm, normal heart sounds and intact distal pulses.   Pulmonary/Chest: Effort normal. He exhibits tenderness.  Abdominal: Soft. There is no tenderness.  Musculoskeletal: He exhibits no edema.  Neurological: He is alert and oriented to person, place, and time.  Skin: Skin is warm and dry. Rash noted. Rash is vesicular.  Large patch of HSV like rash to anterior right chest and superior right back    ED Course  Procedures (including critical care time) Labs Review Labs Reviewed  CBC WITH DIFFERENTIAL - Abnormal; Notable for the following:    Hemoglobin 12.9 (*)    HCT 35.8 (*)    MCV 75.1 (*)    All other components within normal limits  BASIC METABOLIC PANEL - Abnormal; Notable for the following:    Sodium 131 (*)    Glucose, Bld 184 (*)    Creatinine, Ser 2.00 (*)    GFR calc non Af Amer 34 (*)    GFR calc Af Amer 40 (*)    All other components within normal limits  HEPATIC FUNCTION PANEL - Abnormal; Notable for the following:    Albumin 3.4 (*)    All other components within normal limits  URINALYSIS, ROUTINE W REFLEX MICROSCOPIC  CG4 I-STAT (LACTIC ACID)  POCT I-STAT TROPONIN I   Imaging Review Dg Chest 2 View  04/03/2013   CLINICAL DATA:  Left-sided chest pain and fever. Colon cancer diagnosed last year.  EXAM: CHEST  2 VIEW  COMPARISON:  03/29/2013  FINDINGS: The heart size and mediastinal contours are within normal limits. Both lungs are clear. The visualized skeletal structures are  unremarkable. Lungs are hypoaerated with crowding of the bronchovascular markings.  IMPRESSION: No active cardiopulmonary disease.   Electronically Signed   By: Conchita Paris M.D.   On: 04/03/2013 10:45    EKG Interpretation     Ventricular Rate:  76 PR Interval:  172 QRS Duration: 84 QT Interval:  368 QTC Calculation: 414 R Axis:   43 Text Interpretation:  Normal sinus rhythm Normal ECG No significant change since last tracing            MDM   1. Herpes zoster   2. Chest pain    Patient is a poor historian when trying to decipher the course and nature of his chest pain. However, given that he has obvious osteoma right and now with left-sided chest symptoms I feel it would be appropriate to bring the patient into the hospital. This is either atypical cardiac chest pain or 2 dermatomes of zoster. Given that he has systemic symptoms as well he will need further treatment and monitoring.    Ephraim Hamburger, MD 04/03/13 1239

## 2013-04-03 NOTE — Progress Notes (Signed)
ANTIBIOTIC CONSULT NOTE - INITIAL  Pharmacy Consult for Acyclovir Indication: suspected herpes zoster infection / shingles  No Known Allergies  Patient Measurements: Height: 5\' 11"  (180.3 cm) Weight: 233 lb 0.4 oz (105.7 kg) IBW/kg (Calculated) : 75.3  Vital Signs: Temp: 100.1 F (37.8 C) (11/01 1900) Temp src: Oral (11/01 1314) BP: 183/113 mmHg (11/01 1900) Pulse Rate: 79 (11/01 1900) Intake/Output from previous day:   Intake/Output from this shift:    Labs:  Recent Labs  04/03/13 1055  WBC 6.5  HGB 12.9*  PLT 151  CREATININE 2.00*   Estimated Creatinine Clearance: 48 ml/min (by C-G formula based on Cr of 2).  Microbiology: No results found for this or any previous visit (from the past 720 hour(s)).  Medical History: Past Medical History  Diagnosis Date  . Hyperlipidemia   . Hypertension     for a few years and resolved in 2013/2014.  Marland Kitchen Heart murmur   . Diabetes mellitus     type 2 IDDM x 15 years  . Sleep apnea     does not wear CPAP now  . 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 Arthritis     left knee  . 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 12/25/2011  . Chronic kidney disease     acute renal failure/injury requiring short-term HD 2013  . 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 amLODipine (NORVASC) 5 MG tablet Take 1 tablet (5 mg total) by mouth daily.  30 tablet  1  . ferrous sulfate 325 (65 FE) MG tablet Take 1 tablet (325 mg total) by mouth 2 (two) times daily.    3  . OVER THE COUNTER MEDICATION Take 1 tablet by mouth daily as needed (over the counter heartburn relief medication).      . sodium bicarbonate 650 MG tablet Take 650 mg by mouth 2 (two) times daily.      . vitamin C (ASCORBIC ACID) 500 MG tablet Take 500 mg by mouth daily.       Assessment: 62 yo M presented to ED with radiating chest pain, new R sided rash  that is pruritic in nature.  He denies a history of chicken pox as a child or exposure to any sick contacts.  To start acyclovir (renally adjusted) for suspected shingles.  SCr is 2 with estimated CrCl~50 ml/min. Pt is tolerating oral medications and a full diet.    Goal of Therapy:  Symptom improvement; Treatment of infection  Plan:  Begin acyclovir 800 mg PO 5 x day for 7-10 days. Renal dose adjustment not necessary unless CrCl <30. Pharmacy will sign off.  [employer], Pharm.D., BCPS Clinical Pharmacist Pager [phone] 04/03/2013 7:52 PM

## 2013-04-04 ENCOUNTER — Encounter (HOSPITAL_COMMUNITY): Payer: Self-pay | Admitting: Internal Medicine

## 2013-04-04 DIAGNOSIS — E876 Hypokalemia: Secondary | ICD-10-CM

## 2013-04-04 LAB — COMPREHENSIVE METABOLIC PANEL WITH GFR
ALT: 21 U/L (ref 0–53)
AST: 29 U/L (ref 0–37)
Albumin: 3.3 g/dL — ABNORMAL LOW (ref 3.5–5.2)
Alkaline Phosphatase: 59 U/L (ref 39–117)
BUN: 20 mg/dL (ref 6–23)
CO2: 25 meq/L (ref 19–32)
Calcium: 8.5 mg/dL (ref 8.4–10.5)
Chloride: 99 meq/L (ref 96–112)
Creatinine, Ser: 1.94 mg/dL — ABNORMAL HIGH (ref 0.50–1.35)
GFR calc Af Amer: 41 mL/min — ABNORMAL LOW
GFR calc non Af Amer: 36 mL/min — ABNORMAL LOW
Glucose, Bld: 140 mg/dL — ABNORMAL HIGH (ref 70–99)
Potassium: 3.8 meq/L (ref 3.5–5.1)
Sodium: 132 meq/L — ABNORMAL LOW (ref 135–145)
Total Bilirubin: 1.2 mg/dL (ref 0.3–1.2)
Total Protein: 7.5 g/dL (ref 6.0–8.3)

## 2013-04-04 LAB — GLUCOSE, CAPILLARY
Glucose-Capillary: 151 mg/dL — ABNORMAL HIGH (ref 70–99)
Glucose-Capillary: 153 mg/dL — ABNORMAL HIGH (ref 70–99)

## 2013-04-04 LAB — CBC
HCT: 32.3 % — ABNORMAL LOW (ref 39.0–52.0)
Hemoglobin: 11.7 g/dL — ABNORMAL LOW (ref 13.0–17.0)
MCH: 26.7 pg (ref 26.0–34.0)
MCHC: 36.2 g/dL — ABNORMAL HIGH (ref 30.0–36.0)
MCV: 73.6 fL — ABNORMAL LOW (ref 78.0–100.0)
Platelets: 126 10*3/uL — ABNORMAL LOW (ref 150–400)
RBC: 4.39 MIL/uL (ref 4.22–5.81)
RDW: 14.3 % (ref 11.5–15.5)
WBC: 6 10*3/uL (ref 4.0–10.5)

## 2013-04-04 LAB — TROPONIN I: Troponin I: <0.3 ng/mL (ref <0.30)

## 2013-04-04 MED ORDER — ACYCLOVIR 800 MG PO TABS: 800.0000 mg | ORAL_TABLET | Freq: Every day | ORAL | Status: DC

## 2013-04-04 MED ORDER — AMLODIPINE BESYLATE 5 MG PO TABS: 5.0000 mg | ORAL_TABLET | Freq: Every day | ORAL | Status: DC

## 2013-04-04 MED ORDER — ACYCLOVIR 800 MG PO TABS: 800.0000 mg | ORAL_TABLET | Freq: Two times a day (BID) | ORAL | Status: DC

## 2013-04-04 MED ORDER — VALACYCLOVIR HCL 1 G PO TABS: 1000.0000 mg | ORAL_TABLET | Freq: Two times a day (BID) | ORAL | Status: DC

## 2013-04-04 NOTE — Progress Notes (Signed)
Subjective: Pt seen and examined in AM. No acute events overiight. No complaints of chest pain and rash improved with no pruritis or pain. No fevers, chills, nausea, vomiting, or abdominal pain.  .  Objective: Vital signs in last 24 hours: Filed Vitals:   04/03/13 2200 04/04/13 0130 04/04/13 0645 04/04/13 0903  BP: 179/90 173/80 180/90 180/93  Pulse: 79 76 66 67  Temp: 99.5 F (37.5 C) 98.6 F (37 C) 98.8 F (37.1 C) 99.1 F (37.3 C)  TempSrc: Oral Oral Oral Oral  Resp: 20 20 20 20   Height:      Weight:   104.373 kg (230 lb 1.6 oz)   SpO2: 96% 95% 97% 98%   Weight change:   Intake/Output Summary (Last 24 hours) at 04/04/13 2122 Last data filed at 04/04/13 1132  Gross per 24 hour  Intake    360 ml  Output    875 ml  Net   -515 ml   Constitutional: He is oriented to person, place, and time. He appears well-developed and well-nourished. No distress.  HENT: EOMI  Head: Normocephalic and atraumatic.  Right Ear: External ear normal.  Left Ear: External ear normal.  Nose: Nose normal.  Mouth/Throat: Oropharynx is clear and moist. No oropharyngeal exudate.  Eyes: EOM are normal. Right eye exhibits no discharge. Left eye exhibits no discharge.  Neck: Normal range of motion. Neck supple.  Cardiovascular: Normal rate and regular rhythm.  Murmur heard.  Pulmonary/Chest: Effort normal and breath sounds normal. No respiratory distress. He has no wheezes. He has no rales. He exhibits no tenderness.  Abdominal: Soft. Bowel sounds are normal. He exhibits no distension. There is no tenderness. There is no rebound and no guarding.  Musculoskeletal: Normal range of motion. He exhibits no edema and no tenderness.  Neurological: He is alert and oriented to person, place, and time.  Skin: Skin is warm and dry. Improved Rash (previosuly non-erupted vesicles on right chest and armpit now healing with crusted appearance similar to lesions on back in T4 dermatome distribution. He is not  diaphoretic. No erythema. No pallor.  Psychiatric: He has a normal mood and affect. His behavior is normal.   Lab Results: Basic Metabolic Panel:  Recent Labs Lab 04/03/13 1055 04/04/13 0608  NA 131* 132*  K 3.8 3.8  CL 99 99  CO2 22 25  GLUCOSE 184* 140*  BUN 19 20  CREATININE 2.00* 1.94*  CALCIUM 8.5 8.5   Liver Function Tests:  Recent Labs Lab 04/03/13 1055 04/04/13 0608  AST 33 29  ALT 25 21  ALKPHOS 62 59  BILITOT 0.8 1.2  PROT 7.8 7.5  ALBUMIN 3.4* 3.3*   No results found for this basename: LIPASE, AMYLASE,  in the last 168 hours No results found for this basename: AMMONIA,  in the last 168 hours CBC:  Recent Labs Lab 03/29/13 1503 04/03/13 1055 04/04/13 0608  WBC 7.2 6.5 6.0  NEUTROABS 3.6 4.0  --   HGB 12.6* 12.9* 11.7*  HCT 34.4* 35.8* 32.3*  MCV 75.4* 75.1* 73.6*  PLT 201 151 126*   Cardiac Enzymes:  Recent Labs Lab 04/03/13 1955 04/04/13 0126  TROPONINI <0.30 <0.30   BNP: No results found for this basename: PROBNP,  in the last 168 hours D-Dimer: No results found for this basename: DDIMER,  in the last 168 hours CBG:  Recent Labs Lab 04/03/13 1901 04/03/13 2110 04/04/13 0649 04/04/13 1128  GLUCAP 140* 135* 151* 153*   Hemoglobin A1C: No results  found for this basename: HGBA1C,  in the last 168 hours Fasting Lipid Panel: No results found for this basename: CHOL, HDL, LDLCALC, TRIG, CHOLHDL, LDLDIRECT,  in the last 168 hours Thyroid Function Tests: No results found for this basename: TSH, T4TOTAL, FREET4, T3FREE, THYROIDAB,  in the last 168 hours Coagulation: No results found for this basename: LABPROT, INR,  in the last 168 hours Anemia Panel: No results found for this basename: VITAMINB12, FOLATE, FERRITIN, TIBC, IRON, RETICCTPCT,  in the last 168 hours Urine Drug Screen: Drugs of Abuse  No results found for this basename: labopia, cocainscrnur, labbenz, amphetmu, thcu, labbarb    Alcohol Level: No results found for this  basename: ETH,  in the last 168 hours Urinalysis:  Recent Labs Lab 04/03/13 1252  COLORURINE YELLOW  LABSPEC 1.016  PHURINE 5.5  GLUCOSEU NEGATIVE  HGBUR TRACE*  BILIRUBINUR NEGATIVE  KETONESUR NEGATIVE  PROTEINUR 100*  UROBILINOGEN 0.2  NITRITE NEGATIVE  LEUKOCYTESUR NEGATIVE   Misc. Labs:  Micro Results: No results found for this or any previous visit (from the past 240 hour(s)). Studies/Results: Dg Chest 2 View  04/03/2013   CLINICAL DATA:  Left-sided chest pain and fever. Colon cancer diagnosed last year.  EXAM: CHEST  2 VIEW  COMPARISON:  03/29/2013  FINDINGS: The heart size and mediastinal contours are within normal limits. Both lungs are clear. The visualized skeletal structures are unremarkable. Lungs are hypoaerated with crowding of the bronchovascular markings.  IMPRESSION: No active cardiopulmonary disease.   Electronically Signed   By: Conchita Paris M.D.   On: 04/03/2013 10:45   Medications: I have reviewed the patient's current medications. Scheduled Meds: Continuous Infusions: PRN Meds:.   Assessment/Plan: Principal Problem:   Herpes zoster infection of thoracic region Active Problems:   Hyperlipidemia LDL goal <70   OBSTRUCTIVE SLEEP APNEA   Colon cancer, status Post resection    CKD    HYPERTENSION   Chest pain Assessment: 62 year old with past medical history of HTN, HL, insulin-dependent T2DM, OSA, colon cancer s/p partial colectomy 2013 who presents with left sided chest pain that began less than 24 hours ago and found to have herpetic lesions consistent with herpes zoster infection.   Herpes Zoster Infection - improving Patient with new herpetic vesicles on right side of front chest, armpit, and healing lesions on back in dermatomal distrubution (T4). Patient with history of colon cancer s/p partial colectomy in 2013 without chemotherapy or radiation. He has diabetes mellitus but not taking any of medications for 1 year. Thus he may have component of  immunocompromise. No significant neuropathic pain or evidence of eye or ear lesions. He improved significantly after 1 day of acyclovir treatment.   -Day 2/7 acyclovir 800 mg x5 daily -->To receive 4 tablets today until can afford medications tomm in AM for 5 days  -Gabapentin 100 mg PRN neuropathic pain  -Tylenol, tramadol PRN pain  - Zoster vaccine recommended after treatment with acyclovir therapy   Atypical Chest Pain - resolved. Patient most likely with referred pain from T4 involved dermatome herpes zoster infection. He has reproducible left sided pectoralis chest pain with palpation on physical exam suggesting musculoskeletal origin. Troponin and 12-lead EKG did not reveal ischemic changes suggesting cardiac chest pain. However he does have ACS risk factors (DM, HTN, HL, smoker) so ACS rule out indicated.  -Oxygen therapy as need to keep SpO2>92%  -Aspirin 325 mg daily  -Morphine PRN for pain, zofran PRN for nausea  -Cycle Troponin -negative x 3 -EKG for  recurrent CP    Hypertension - currently hypertensive without evidence of end organ damage Patiet reports he can obtain medication.  -Continue home amlodipine 5 mg daily   Hypotonic Hyponatremia - Patient with sodium level of 131 on admission and calculated osmolarity of 278. Most likely hypovolemic hyponatremia due to decreased PO intake. He received 1L bolus in ED.  -Monitor BMP  -Carb modified diet   Hypoalbuminemia - Patient with mildly low albumin (3.4) on admission most likely due malnutrition  -Carb modified diet  -Encourage adequate nutrition   Hyperglycemia - Patient with past history of insulin dependent diabetes mellitus, last A1C 6.2 on 03/02/2102.  Patient with glucose of 184 on admission with AG 10. UA without ketonuria.  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  -Sodium  bicarbonate 650 mg BID   Iron deficiency Anemia - Hg 12.9 above baseline (9-10). Most likely due to colon cancer s/p resection. No reports of recent GI bleeding.  -Continue ferrous sulfate 325 mg daily  -Continue vitamin C 500 mg daily  Diet: Carb modified  DVT PPx Heparin SQ TID  Code: Full    Dispo: Disposition is deferred at this time, awaiting improvement of current medical problems.  Anticipated discharge in approximately today.   The patient does have a current PCP [name], MD) and does need an [facility] hospital follow-up appointment after discharge.  The patient does have transportation limitations that hinder transportation to clinic appointments.  .Services Needed at time of discharge: Y = Yes, Blank = No PT:   OT:   RN:   Equipment:   Other:     LOS: 1 day   Juluis Mire, MD 04/04/2013, 9:22 PM

## 2013-04-04 NOTE — Progress Notes (Signed)
   CARE MANAGEMENT NOTE 04/04/2013  Patient:  [name],[name]   Account Number:  [id number]  Date Initiated:  04/04/2013  Documentation initiated by:  [facility]  Subjective/Objective Assessment:   adm: Herpes zoster     Action/Plan:   discharge planning   Anticipated DC Date:  04/04/2013   Anticipated DC Plan:  [place]  CM consult      Choice offered to / List presented to:             Status of service:  Completed, signed off Medicare Important Message given?   (If response is "NO", the following Medicare IM given date fields will be blank) Date Medicare IM given:   Date Additional Medicare IM given:    Discharge Disposition:  HOME/SELF CARE  Per UR Regulation:    If discussed at Long Length of Stay Meetings, dates discussed:    Comments:  04/04/13 10:40 CM brought [place] letter and list of participating pharmacies to room for pt and explained parameters of assistance. Pt states  Pt understands it is important to pursue coverage and states he is getting Medicare "soon."   Pt has PCP and no other CM needs were communicated.  Mariane Masters, BSN, CM [phone].

## 2013-04-04 NOTE — Progress Notes (Signed)
Utilization Review Completed.[name] T11/07/2012  

## 2013-04-04 NOTE — H&P (Signed)
  Date: 04/04/2013  Patient name: Tiziano Pringle.  Medical record number: [id number]  Date of birth: [birth year]   I have seen and evaluated Klint Adline Mango. and discussed their care with the Residency Team.   Assessment and Plan: I have seen and evaluated the patient as outlined above. I agree with the formulated Assessment and Plan as detailed in the residents' admission note, with the following changes:   1. Herpes zoster - pt was started on Acylovir and the lesions have almost completely died over / crusted. He will cont the med at home to complete course. He is having no sig pain so no need for gabapentin / opioids.  2. Non cardiac L sided chest pain - has resolved.  3. HTN - he will be able to obtain the norvasc at [facility] for $4.   D/C home today only on Acylovir, amlodipine, sodium bicarcb, and iron.  Bartholomew Crews, MD 11/2/201410:06 AM

## 2013-04-05 NOTE — Discharge Summary (Signed)
Name: [name]. MRN: [id number] DOB: [birth year] 62 y.o. PCP: [name], MD  Date of Admission: 04/03/2013  8:45 AM Date of Discharge: 04/04/2013 Attending Physician: Larey Dresser MD  Discharge Diagnosis: 1.  Principal Problem:   Herpes zoster infection of thoracic region Active Problems:   Hyperlipidemia LDL goal <70   OBSTRUCTIVE SLEEP APNEA   Colon cancer, status Post resection    CKD    HYPERTENSION   Chest pain  Discharge Medications:   Medication List         acyclovir 800 MG tablet  Commonly known as:  ZOVIRAX  Take 1 tablet (800 mg total) by mouth 5 (five) times daily.     acyclovir 800 MG tablet  Commonly known as:  ZOVIRAX  Take 1 tablet (800 mg total) by mouth 5 (five) times daily.     amLODipine 5 MG tablet  Commonly known as:  NORVASC  Take 1 tablet (5 mg total) by mouth daily.     ferrous sulfate 325 (65 FE) MG tablet  Take 1 tablet (325 mg total) by mouth 2 (two) times daily.     OVER THE COUNTER MEDICATION  Take 1 tablet by mouth daily as needed (over the counter heartburn relief medication).     sodium bicarbonate 650 MG tablet  Take 650 mg by mouth 2 (two) times daily.     vitamin C 500 MG tablet  Commonly known as:  ASCORBIC ACID  Take 500 mg by mouth daily.        Disposition and follow-up:   [name] was discharged from [facility] in Good condition.  At the hospital follow up visit please address:  1.  Resolution of Herpes Zoster Infection       Herpes Zoster Vaccination       Compliance of blood pressure medication       2.  Labs / imaging needed at time of follow-up: none  3.  Pending labs/ test needing follow-up: none  Follow-up Appointments:     Follow-up Information   Follow up with [name], MD In 4 weeks.   Specialty:  Internal Medicine   Contact information:   [place] [address] [phone]       Discharge Instructions: Discharge  Orders   Future Appointments Provider Department Dept Phone   05/17/2013 2:30 PM [facility] [id number]   05/17/2013 3:00 PM Ladell Pier, MD [place] [phone]   Future Orders Complete By Expires   Diet - low sodium heart healthy  As directed    Increase activity slowly  As directed       Consultations: none  Procedures Performed:  Dg Chest 2 View  04/03/2013   CLINICAL DATA:  Left-sided chest pain and fever. Colon cancer diagnosed last year.  EXAM: CHEST  2 VIEW  COMPARISON:  03/29/2013  FINDINGS: The heart size and mediastinal contours are within normal limits. Both lungs are clear. The visualized skeletal structures are unremarkable. Lungs are hypoaerated with crowding of the bronchovascular markings.  IMPRESSION: No active cardiopulmonary disease.   Electronically Signed   By: Conchita Paris M.D.   On: 04/03/2013 10:45   Dg Chest 2 View  03/29/2013   CLINICAL DATA:  Right-sided chest pain  EXAM: CHEST  2 VIEW  COMPARISON:  04/13/2012  FINDINGS: The heart size and mediastinal contours are within normal limits. Both lungs are clear.  The visualized skeletal structures are unremarkable.  IMPRESSION: No active cardiopulmonary disease.   Electronically Signed   By: Inez Catalina M.D.   On: 03/29/2013 14:40    2D Echo: none  Cardiac Cath: none  Admission HPI:   [name] is a 62 year old with past medical history of HTN, HL, insulin-dependent T2DM, OSA, colon cancer s/p partial colectomy 2013 who presents with left sided chest pain that began less than 24 hours ago. Patient reports that he initially had right sided chest pain with radiation to his right shoulder and arm that he atrributed to fishing. He was seen in the ED on 10/27 and found to have negative troponin and nonischemic changes on 12-lead EKG that was non-concerning for cardiac chest pain. He then noticed a rash that developed on the  right side of his back, armpit and right front chest after being seen in the ED that was pruritic in nature without sensory change. He placed icy hot patch on it with some relief. Last night he began having left sided 5/10 chest pain without radiation that felt like something was sticking out his chest. Pain is worse if he presses on his chest. He denies dyspnea, diaphoresis, or palpitations. He has never had similar chest pain in the past. He also reports fatigue, chills, and decreased appetite for the last 3-4 days. He states he never had chicken pox as a child and has not been anyone lately that has had chicken pox or shingles. He reports he has not taken any of his medications for 1 year due to inability to afford them.    Hospital Course by problem list: Principal Problem:   Herpes zoster infection of thoracic region Active Problems:   Hyperlipidemia LDL goal <70   OBSTRUCTIVE SLEEP APNEA   Colon cancer, status Post resection    CKD    HYPERTENSION   Chest pain   Herpes Zoster Infection of Thoracic Region - Patient presented with non-erupted vesicles on right side of chest, armpit, and black healed crusted over lesions on back in dermatome distribution of T4 consistent with herpes zoster infection. Patient was not immunocompromised (colon cancer w/o chemo or radiation last year). He had not previously received herpes zoster vaccination and reported no recent exposure to persons with varicella-zoster. He denied neuropathic pain or lesions in eye or ear to suggest Ramsay Hunt Syndrome or Herpes Zoster Ophthalmicus. He was started on acyclovir 800 mg (x 5 daily)  as well as morphine, tramadol, and gabapentin (did not receive) as needed for neuropathic pain. On day 2 of hospitalization patient after 3 total doses of acyclovir, he had dramatic improvement of rash with healing of previous vesicles on front right chest and under armpit. He denied any pain or pruritis of rash. He was instructed to  continue taking acyclovir for 5 additional days and tylenol if needed for pain. He declined any other pain medications. Patient was given information for [facility] program so that he will be able to afford the medication (acyclovir chosen over valacyclovir due to cost). Shingles vaccination was not administered due to active infection. He is to follow-up with PCP for vaccination once completion of anti-viral treatment and resolution of infection.       Atypical Chest Pain - Patient presented with left sided chest pain that was thought to be musculoskeletal in nature due to reproducibility of pain with palpation of left pectoral muscle or referred pain from herpes zoster infection. Pain resolved on day 2 of hospitalization after acyclovir administration.  Due to ACS risk factors (HTN, HL, tobacco use), troponins were cycled which were negative and 12-lead EKG did not reveal ischemic changes concerning for cardiac chest pain. Chest x-ray did not reveal active cardiopulmonary disease.     Hypertension - Blood pressure was controlled with amlodipine during hospitalization.  Patient is on home amlodipine that he reports he has not been taking for some time due to inability to afford medication, however stated that he will obtain the medication the day after discharge. He remained asymptomatic and there was no evidence of hypertensive emergency (end organ damage). Blood pressure ranged from 112/82 - 187/92 during hospitalization.   Hyperlipidemia -Patient was continued on aspirin (325 mg) during hospitalization. Lipid panel on 07/04/11 revealed low HDL cholesterol with normal total cholesterol, triglycerides, and LDL cholesterol. He is not on statin therapy at home.     Hyperglycemia - Patient with past history of diabetes mellitus however off medications since last HbA1c was normal. (6.2 on 03/02/12).  He presented with glucose level of 184. There was no anion gap and no glucosuria or proteinuria on urine anyalsis. He  received insulin sliding scale and CBG levels ranged from 140-153.   Hypotonic Hyponatremia - Patient with sodium level of 131 on admission and calculated osmolarity of 278. Most likely hypovolemic hyponatremia due to decreased PO intake. He received 1L bolus in ED. Sodium levels improved slightly to 132 at time of discharge.    Hypoalbuminemia - Patient with mildly low albumin (3.4) on admission most likely due to chronic malnutrition. There was no proteinuria, edema, or renal failure to suggest nephrotic syndrome. Liver function test was also normal. His albumin at discharge was 3.3.     Chronic Kidney Disease Stage III - Patient with Cr 2.00 on admission, below baseline (2.2-2.4). His Cr improved to  1.94 at discharge. He received oral biarcarbonate supplementation during hospitalization.   Iron Deficiency Anemia -  Hemoglobin levels were stable during hospitalization and no blood transfusion was necessary. He remained asymptomatic. Patient withIron deficiency anemia thought to be due to past history of colon cancer. Patient received ferrous sulfate and vitamin C during hospitalization.     Discharge Vitals:   BP 180/93  Pulse 67  Temp(Src) 99.1 F (37.3 C) (Oral)  Resp 20  Ht 5\' 11"  (1.803 m)  Wt 104.373 kg (230 lb 1.6 oz)  BMI 32.11 kg/m2  SpO2 98%  Discharge Labs:  Results for orders placed during the hospital encounter of 04/03/13 (from the past 24 hour(s))  TROPONIN I     Status: None   Collection Time    04/04/13  1:26 AM      Result Value Range   Troponin I <0.30  <0.30 ng/mL  COMPREHENSIVE METABOLIC PANEL     Status: Abnormal   Collection Time    04/04/13  6:08 AM      Result Value Range   Sodium 132 (*) 135 - 145 mEq/L   Potassium 3.8  3.5 - 5.1 mEq/L   Chloride 99  96 - 112 mEq/L   CO2 25  19 - 32 mEq/L   Glucose, Bld 140 (*) 70 - 99 mg/dL   BUN 20  6 - 23 mg/dL   Creatinine, Ser 1.94 (*) 0.50 - 1.35 mg/dL   Calcium 8.5  8.4 - 10.5 mg/dL   Total Protein 7.5  6.0  - 8.3 g/dL   Albumin 3.3 (*) 3.5 - 5.2 g/dL   AST 29  0 - 37 U/L   ALT  21  0 - 53 U/L   Alkaline Phosphatase 59  39 - 117 U/L   Total Bilirubin 1.2  0.3 - 1.2 mg/dL   GFR calc non Af Amer 36 (*) >90 mL/min   GFR calc Af Amer 41 (*) >90 mL/min  CBC     Status: Abnormal   Collection Time    04/04/13  6:08 AM      Result Value Range   WBC 6.0  4.0 - 10.5 K/uL   RBC 4.39  4.22 - 5.81 MIL/uL   Hemoglobin 11.7 (*) 13.0 - 17.0 g/dL   HCT 32.3 (*) 39.0 - 52.0 %   MCV 73.6 (*) 78.0 - 100.0 fL   MCH 26.7  26.0 - 34.0 pg   MCHC 36.2 (*) 30.0 - 36.0 g/dL   RDW 14.3  11.5 - 15.5 %   Platelets 126 (*) 150 - 400 K/uL  GLUCOSE, CAPILLARY     Status: Abnormal   Collection Time    04/04/13  6:49 AM      Result Value Range   Glucose-Capillary 151 (*) 70 - 99 mg/dL   Comment 1 Notify RN     Comment 2 Documented in Chart    GLUCOSE, CAPILLARY     Status: Abnormal   Collection Time    04/04/13 11:28 AM      Result Value Range   Glucose-Capillary 153 (*) 70 - 99 mg/dL    Signed: Juluis Mire, MD 04/04/2013, 9:43 PM   Time Spent on Discharge: 30 minutes Services Ordered on Discharge: none Equipment Ordered on Discharge: none

## 2013-04-05 NOTE — Progress Notes (Signed)
Patient request from ED lobby for ED CM assistance.Patient reports he was provided with a [place] letter on 11/ 2 /14 But [facility] reports they cant locate him in system.This  Probation officer checked Match and patient was located in [facility].Patient updated in lobby we will fax his [facility] information to [facility] [address]- and he can then receive his prescriptions. Patient reports his understanding.No further needs identified today.

## 2013-04-05 NOTE — Care Management Note (Signed)
    Page 1 of 1   04/05/2013     10:41:10 AM   CARE MANAGEMENT NOTE 04/05/2013  Patient:  Monette,Cassey   Account Number:  [id number]  Date Initiated:  04/04/2013  Documentation initiated by:  [facility]  Subjective/Objective Assessment:   adm: Herpes zoster     Action/Plan:   discharge planning   Anticipated DC Date:  04/04/2013   Anticipated DC Plan:  [place]  CM consult      Choice offered to / List presented to:             Status of service:  Completed, signed off Medicare Important Message given?   (If response is "NO", the following Medicare IM given date fields will be blank) Date Medicare IM given:   Date Additional Medicare IM given:    Discharge Disposition:  HOME/SELF CARE  Per UR Regulation:    If discussed at Long Length of Stay Meetings, dates discussed:    Comments:  04/05/13 [place], RN, BSN, [employer] [phone] Spoke to pt via telephone regarding [place] letter.  Pt states that he never recieved a MATCH letter/card for medications. NCM will re-print and fax to [place] [phone]).   04/04/13 10:40 CM brought [place] letter and list of participating pharmacies to room for pt and explained parameters of assistance. Pt states  Pt understands it is important to pursue coverage and states he is getting Medicare "soon."   Pt has PCP and no other CM needs were communicated.  Mariane Masters, BSN, CM [phone].

## 2013-05-17 ENCOUNTER — Telehealth: Payer: Self-pay | Admitting: Oncology

## 2013-05-17 ENCOUNTER — Other Ambulatory Visit (HOSPITAL_BASED_OUTPATIENT_CLINIC_OR_DEPARTMENT_OTHER): Payer: Medicaid Other

## 2013-05-17 ENCOUNTER — Ambulatory Visit (HOSPITAL_BASED_OUTPATIENT_CLINIC_OR_DEPARTMENT_OTHER): Payer: Medicaid Other | Admitting: Oncology

## 2013-05-17 VITALS — BP 146/90 | HR 70 | Temp 99.1°F | Resp 18 | Ht 71.0 in | Wt 240.8 lb

## 2013-05-17 DIAGNOSIS — R718 Other abnormality of red blood cells: Secondary | ICD-10-CM

## 2013-05-17 DIAGNOSIS — D539 Nutritional anemia, unspecified: Secondary | ICD-10-CM

## 2013-05-17 DIAGNOSIS — D649 Anemia, unspecified: Secondary | ICD-10-CM

## 2013-05-17 DIAGNOSIS — C189 Malignant neoplasm of colon, unspecified: Secondary | ICD-10-CM

## 2013-05-17 DIAGNOSIS — I10 Essential (primary) hypertension: Secondary | ICD-10-CM

## 2013-05-17 DIAGNOSIS — E119 Type 2 diabetes mellitus without complications: Secondary | ICD-10-CM

## 2013-05-17 DIAGNOSIS — D509 Iron deficiency anemia, unspecified: Secondary | ICD-10-CM

## 2013-05-17 DIAGNOSIS — N289 Disorder of kidney and ureter, unspecified: Secondary | ICD-10-CM

## 2013-05-17 DIAGNOSIS — C184 Malignant neoplasm of transverse colon: Secondary | ICD-10-CM

## 2013-05-17 DIAGNOSIS — R21 Rash and other nonspecific skin eruption: Secondary | ICD-10-CM

## 2013-05-17 LAB — CBC WITH DIFFERENTIAL/PLATELET
BASO%: 0.6 % (ref 0.0–2.0)
Basophils Absolute: 0 10*3/uL (ref 0.0–0.1)
EOS%: 2.7 % (ref 0.0–7.0)
Eosinophils Absolute: 0.2 10*3/uL (ref 0.0–0.5)
HCT: 33 % — ABNORMAL LOW (ref 38.4–49.9)
HGB: 11.5 g/dL — ABNORMAL LOW (ref 13.0–17.1)
LYMPH%: 37.4 % (ref 14.0–49.0)
MCH: 27.1 pg — ABNORMAL LOW (ref 27.2–33.4)
MCHC: 34.7 g/dL (ref 32.0–36.0)
MCV: 78.2 fL — ABNORMAL LOW (ref 79.3–98.0)
MONO#: 0.4 10*3/uL (ref 0.1–0.9)
MONO%: 6.8 % (ref 0.0–14.0)
NEUT#: 3.5 10*3/uL (ref 1.5–6.5)
NEUT%: 52.5 % (ref 39.0–75.0)
Platelets: 204 10*3/uL (ref 140–400)
RBC: 4.23 10*6/uL (ref 4.20–5.82)
RDW: 16.2 % — ABNORMAL HIGH (ref 11.0–14.6)
WBC: 6.6 10*3/uL (ref 4.0–10.3)
lymph#: 2.5 10*3/uL (ref 0.9–3.3)

## 2013-05-17 NOTE — Progress Notes (Signed)
   [facility]    OFFICE PROGRESS NOTE   INTERVAL HISTORY:   [name] returns as scheduled. He feels well. He was admitted in November with right chest zoster. The rash has healed. He continues to have pain over the chest wall, but is not taking pain medication. He otherwise feels well. He has not undergone a surveillance colonoscopy.  Objective:  Vital signs in last 24 hours:  Blood pressure 146/90, pulse 70, temperature 99.1 F (37.3 C), temperature source Oral, resp. rate 18, height 5\' 11"  (1.803 m), weight 240 lb 12.8 oz (109.226 kg).    HEENT: Neck without mass Lymphatics: No cervical, supraclavicular, axillary, or inguinal nodes Resp: Lungs clear bilaterally, distant breath sounds Cardio: Regular rate and rhythm GI: No hepatosplenomegaly, nontender, no mass Vascular: No leg edema  Skin: Healed zoster rash over the right upper chest and back     Lab Results:  Lab Results  Component Value Date   WBC 6.6 05/17/2013   HGB 11.5* 05/17/2013   HCT 33.0* 05/17/2013   MCV 78.2* 05/17/2013   PLT 204 05/17/2013   ANC 3.5  CEA pending  Medications: I have reviewed the patient's current medications.  Assessment/Plan: 1.Adenocarcinoma of the transverse colon, stage II (T3 N0), status post a right hemicolectomy on 02/06/2012. The tumor was microsatellite stable.  2. Postoperative anastomotic leak/septic shock, status post resection of the anastomosis and creation of an ileostomy , status post ileostomy takedown April 2014 3. history of Repeat admissions with dehydration/acute renal failure secondary to a high output ileostomy  4. Diabetes  5. Microcytic anemia August 2013-predating surgery , the red cell microcytosis persists despite taking iron. He has chronic red cell microcytosis,? Thalassemia variant. We checked a ferritin level today.the mild anemia could also be in part related to chronic renal insufficiency.  6. zoster rash right chest in November 2014   7. hypertension    Disposition:  [name] remains in clinical remission from colon cancer. He has chronic red cell microcytosis and now has mild anemia. We checked a ferritin level today. I suspect he has a thalassemia variant. The anemia could also be in part related to chronic renal insufficiency.  We will followup on the CEA from today. He will return for an office visit and CEA in 6 months. We will refer him to [name] for a surveillance colonoscopy.   Rodney Coder, MD  05/17/2013  3:29 PM

## 2013-05-17 NOTE — Telephone Encounter (Signed)
gv and printeded appt sched and avs for pt for Jan and June 2015

## 2013-05-18 ENCOUNTER — Telehealth: Payer: Self-pay | Admitting: *Deleted

## 2013-05-18 LAB — CEA: CEA: 2.4 ng/mL (ref 0.0–5.0)

## 2013-05-18 LAB — FERRITIN CHCC: Ferritin: 48 ng/ml (ref 22–316)

## 2013-05-18 NOTE — Telephone Encounter (Signed)
Message copied by Brien Few on Tue May 18, 2013 10:35 AM ------      Message from: Ladell Pier      Created: Tue May 18, 2013  7:21 AM       Please call patient, cea is normal ------

## 2013-05-18 NOTE — Telephone Encounter (Signed)
Called pt with normal CEA results. He voiced appreciation for call. 

## 2013-06-14 ENCOUNTER — Other Ambulatory Visit: Payer: Self-pay | Admitting: *Deleted

## 2013-06-14 DIAGNOSIS — I10 Essential (primary) hypertension: Secondary | ICD-10-CM

## 2013-06-14 MED ORDER — AMLODIPINE BESYLATE 5 MG PO TABS: 5.0000 mg | ORAL_TABLET | Freq: Every day | ORAL | Status: DC

## 2013-06-14 NOTE — Telephone Encounter (Signed)
Pls sch CC appt Dr Raliegh Ip next 3 months. HTN mgmt

## 2013-06-17 ENCOUNTER — Other Ambulatory Visit: Payer: Self-pay | Admitting: *Deleted

## 2013-06-17 DIAGNOSIS — D539 Nutritional anemia, unspecified: Secondary | ICD-10-CM

## 2013-06-17 MED ORDER — FERROUS SULFATE 325 (65 FE) MG PO TABS: 325.0000 mg | ORAL_TABLET | Freq: Two times a day (BID) | ORAL | Status: DC

## 2013-06-22 ENCOUNTER — Encounter: Payer: Self-pay | Admitting: Internal Medicine

## 2013-06-23 ENCOUNTER — Ambulatory Visit (INDEPENDENT_AMBULATORY_CARE_PROVIDER_SITE_OTHER): Payer: Medicaid Other | Admitting: Internal Medicine

## 2013-06-23 ENCOUNTER — Encounter: Payer: Self-pay | Admitting: Internal Medicine

## 2013-06-23 VITALS — BP 140/80 | HR 67 | Temp 97.0°F | Ht 71.0 in | Wt 240.3 lb

## 2013-06-23 DIAGNOSIS — C189 Malignant neoplasm of colon, unspecified: Secondary | ICD-10-CM

## 2013-06-23 DIAGNOSIS — I129 Hypertensive chronic kidney disease with stage 1 through stage 4 chronic kidney disease, or unspecified chronic kidney disease: Secondary | ICD-10-CM

## 2013-06-23 DIAGNOSIS — Z0000 Encounter for general adult medical examination without abnormal findings: Secondary | ICD-10-CM

## 2013-06-23 DIAGNOSIS — N183 Chronic kidney disease, stage 3 unspecified: Secondary | ICD-10-CM

## 2013-06-23 DIAGNOSIS — F17200 Nicotine dependence, unspecified, uncomplicated: Secondary | ICD-10-CM

## 2013-06-23 DIAGNOSIS — E669 Obesity, unspecified: Secondary | ICD-10-CM

## 2013-06-23 DIAGNOSIS — B029 Zoster without complications: Secondary | ICD-10-CM

## 2013-06-23 DIAGNOSIS — E785 Hyperlipidemia, unspecified: Secondary | ICD-10-CM

## 2013-06-23 DIAGNOSIS — I10 Essential (primary) hypertension: Secondary | ICD-10-CM

## 2013-06-23 MED ORDER — AMLODIPINE BESYLATE 5 MG PO TABS: 5.0000 mg | ORAL_TABLET | Freq: Every day | ORAL | Status: DC

## 2013-06-23 NOTE — Assessment & Plan Note (Signed)
Not ready to quit. Counseled the patient about the risks of smoking.

## 2013-06-23 NOTE — Assessment & Plan Note (Signed)
Patient cannot afford shingles vaccine. Medicaid does not cover it. He reports that he up to-date with flu vaccination but we can not find it in the EMR.

## 2013-06-23 NOTE — Progress Notes (Signed)
Patient ID: Rodney Reasons., male   DOB: February 28, 1951, 63 y.o.   MRN: [id number]  Subjective:   HPI: Mr.Rodney Marquies Mimms. is a 63 y.o. African American gentleman, with past medical history of colon cancer, status post surgery 2013, chronic kidney disease, hypertension, and cigarette smoking, presents to the clinic for routine followup visit.  He was hospitalized in November 2014 for an acute episode of herpes zoster on the right T4 dermatome. He reports minimal pain without any analgesics at this time. Patient cannot have shingles vaccine due to cost as this is not covered by Medicaid.  Currently, the patient denies any symptoms. He denies diarrhea, abdominal pain, bloody stools, or melena. His appetite has been excellent.  Patient reports that his been off his amlodipine due to issues with his insurance, which has since been reactivated. He feels now, that he'll be able to get his medications. He requests for prescriptions.   In terms of his colon cancer, patient was recently evaluated by Dr. Benay Spice of oncology and repeat CEA was normal. He is scheduled to have a colonoscopy by [name] on 06/28/2012. Patient is aware of his appointments and I have encouraged him to follow through with his cancer surveillance.   Kindly see the A&P for the status of the pt's chronic medical problems.   Past Medical History  Diagnosis Date  . Hyperlipidemia   . Hypertension     for a few years and resolved in 2013/2014.  Marland Kitchen Heart murmur   . DM II (diabetes mellitus, type II), controlled     type 2 IDDM x 15 years. A1C 1/09 13.7.   Marland Kitchen Sleep apnea     does not wear CPAP now  . 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 Arthritis     left knee  . 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 12/25/2011  . Chronic kidney disease     acute renal failure/injury requiring short-term HD 2013  . 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 Multiple Vitamin (MULTIVITAMIN) tablet Take 1 tablet by mouth daily.      Marland Kitchen amLODipine (NORVASC) 5 MG tablet Take 1 tablet (5 mg total) by mouth daily.  30 tablet  0   No current facility-administered medications for this visit.   Family History  Problem Relation Age of Onset  . Diabetes Father   . Heart disease Father   . Colon cancer Neg Hx   . Diabetes Sister   . Heart disease Sister   . Heart disease Mother    History   Social History  . Marital Status: Divorced    Spouse Name: N/A    Number of Children: 3  . Years of Education: N/A   Occupational History  .      Worked at [employer] Topics  . Smoking status: Current Every Day Smoker -- 0.30 packs/day for 30 years    Types: Cigarettes  . Smokeless tobacco: Never Used  . Alcohol Use: Yes     Comment: Liquor twice monthly.  . Drug Use: No  . Sexual Activity: None   Other Topics Concern  . None   Social History Narrative   Financial assistance approved for 100% discount at [facility] and has [facility] card   [employer]  August 08, 2009 5:48 PM   *PATIENT WAS GIVEN DM CARD.Lela Sturdivant NT II  June 08, 2010 3:56 PM      Divorced, one daughter currently staying with him   Has 7 grand children   Reports disability and medicaid is pending   Review of Systems: Constitutional: Denies fever, chills, diaphoresis, appetite change and fatigue.  Respiratory: Denies SOB, DOE, cough, chest tightness, and wheezing. Denies chest pain. Cardiovascular: No chest pain, palpitations and leg swelling.  Gastrointestinal: No abdominal pain, nausea, vomiting, bloody stools Genitourinary: No dysuria, frequency, hematuria, or flank pain.  Musculoskeletal: No myalgias, back pain, joint swelling, arthralgias  Psych: No depression symptoms. No SI or SA.   Objective:  Physical Exam: Filed Vitals:   06/23/13 0932 06/23/13 1026  BP: 159/87 140/80  Pulse: 67 67  Temp: 97 F (36.1 C)    TempSrc: Oral   Height: 5\' 11"  (1.803 m)   Weight: 240 lb 4.8 oz (108.999 kg)   SpO2: 99%    General: Well nourished. No acute distress.  HEENT: Normal oral mucosa. MMM.  Lungs/chest: CTA bilaterally. Right T4 Herpes scar, well healed. No tenderness.  Heart: RRR; no extra sounds or murmurs  Abdomen: Non-distended, normal BS, soft, nontender; no hepatosplenomegaly.  Extremities: No pedal edema. No joint swelling or tenderness. Neurologic: Normal EOM,  Alert and oriented x3. No obvious neurologic/cranial nerve deficits.  Assessment & Plan:  I have discussed my assessment and plan  with my attending in the clinic, Dr. Lynnae January as detailed under problem based charting.

## 2013-06-23 NOTE — Patient Instructions (Signed)
General Instructions: Please take Amlodipine 5 mg daily  Please keep you appointment with Dr [name] for a colonoscopy  Please come back in 2-3 weeks for blood pressure check   Treatment Goals:  Goals (1 Years of Data) as of 06/23/13         As of Today 05/17/13 04/04/13 04/04/13 04/04/13     Blood Pressure    . Blood Pressure < 130/80  159/87 146/90 180/93 180/90 173/80    . Blood Pressure < 140/90  159/87 146/90 180/93 180/90 173/80    . Blood Pressure < 140/90  159/87 146/90 180/93 180/90 173/80    . Blood Pressure < 140/90  159/87 146/90 180/93 180/90 173/80     Lifestyle    . Quit smoking / using tobacco          . Quit smoking / using tobacco           Result Component    . HEMOGLOBIN A1C < 7.0          . HEMOGLOBIN A1C < 7.0          . LDL CALC < 100          . LDL CALC < 100          . LDL CALC < 100            Progress Toward Treatment Goals:  Treatment Goal 06/23/2013  Hemoglobin A1C -  Blood pressure unchanged  Stop smoking smoking the same amount    Self Care Goals & Plans:  Self Care Goal 06/23/2013  Manage my medications take my medicines as prescribed; bring my medications to every visit; refill my medications on time; follow the sick day instructions if I am sick  Monitor my health -  Eat healthy foods -  Be physically active -  Stop smoking -    No flowsheet data found.   Care Management & Community Referrals:  Referral 03/08/2013  Referrals made for care management support smoking cessation counselor

## 2013-06-23 NOTE — Assessment & Plan Note (Signed)
Patient last LDL was 2013 and it was 37. He has been off statins since his surgery in 02/2012. He had significant wt loss post op and a lot of his numbers improved including remission of his diabetes.   Plan  - check lipid panel and treat if significantly elevated - hold off statin therapy for now

## 2013-06-23 NOTE — Assessment & Plan Note (Signed)
BP Readings from Last 3 Encounters:  06/23/13 140/80  05/17/13 146/90  04/04/13 180/93    Lab Results  Component Value Date   NA 132* 04/04/2013   K 3.8 04/04/2013   CREATININE 1.94* 04/04/2013    Assessment: Blood pressure control: moderately elevated Progress toward BP goal:  unchanged Comments: has been off his medications. He mentioned to me that he has had some issues with dizziness on standing up when he was started on "a very strong" antihypertensive medication in the past.  Plan: Medications:  Restart amlodipine at 5 mg daily. Educational resources provided:   Self management tools provided: home blood pressure logbook;instructions for home blood pressure monitoring Other plans: Given his reported history of what appears to be orthostasis with hypotension, treatment, will be cautious with his medications -followup in 2-3 weeks for medication adjustment.Marland Kitchen

## 2013-06-23 NOTE — Assessment & Plan Note (Signed)
Recent CEA was 2.5 (05/17/2013). Reminded him to keep his colonoscopy appointment. He verbalized understanding.

## 2013-06-24 NOTE — Progress Notes (Signed)
Case discussed with Dr. Kazibwe soon after the resident saw the patient.  We reviewed the resident's history and exam and pertinent patient test results.  I agree with the assessment, diagnosis, and plan of care documented in the resident's note. 

## 2013-06-28 ENCOUNTER — Encounter: Payer: Self-pay | Admitting: Gastroenterology

## 2013-06-28 ENCOUNTER — Ambulatory Visit (INDEPENDENT_AMBULATORY_CARE_PROVIDER_SITE_OTHER): Payer: Medicaid Other | Admitting: Gastroenterology

## 2013-06-28 VITALS — BP 126/76 | HR 76 | Ht 70.0 in | Wt 243.0 lb

## 2013-06-28 DIAGNOSIS — C189 Malignant neoplasm of colon, unspecified: Secondary | ICD-10-CM

## 2013-06-28 MED ORDER — PEG-KCL-NACL-NASULF-NA ASC-C 100 G PO SOLR: 1.0000 | Freq: Once | ORAL | Status: DC

## 2013-06-28 NOTE — Progress Notes (Signed)
_                                                                                                              1.Adenocarcinoma of the transverse colon, stage II (T3 N0), status post a right hemicolectomy on 02/06/2012. The tumor was microsatellite stable.  2. Postoperative anastomotic leak/septic shock, status post resection of the anastomosis and creation of an ileostomy , status post ileostomy takedown April 2014  History of Present Illness: Patient is here to schedule followup colonoscopy.  Since takedown of the diverting ileostomy he has felt well.  He has no GI complaints including change of bowel habits, melena or hematochezia.  A mild microcytic anemia with hemoglobin 11.5 was noted in December, 2014.    Past Medical History  Diagnosis Date  . Hyperlipidemia   . Hypertension     for a few years and resolved in 2013/2014.  Marland Kitchen Heart murmur   . DM II (diabetes mellitus, type II), controlled     type 2 IDDM x 15 years. A1C 1/09 13.7.   Marland Kitchen Sleep apnea     does not wear CPAP now  . 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 Arthritis     left knee  . 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 12/25/2011  . Chronic kidney disease     acute renal failure/injury requiring short-term HD 2013  . 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 Colon cancer    Past Surgical History  Procedure Laterality Date  . Colonoscopy    . Ganglion cyst excision  20 YRS AGO    RT ARM  . Partial colectomy  02/06/2012  . Partial colectomy  02/06/2012    Procedure: PARTIAL COLECTOMY;  Surgeon: Imogene Burn. [name], MD;  Location: [place];  Service: General;  Laterality: N/A;  right partial colectomy  . Colonoscopy  02/06/2012    Procedure: COLONOSCOPY;  Surgeon: Milus Banister, MD;  Location: [place];  Service: Endoscopy;;  . Laparotomy  02/16/2012    Procedure: EXPLORATORY LAPAROTOMY;  Surgeon: Zenovia Jarred, MD;  Location: [place];  Service: General;  Laterality: N/A;   Exploratory Laparotomy with resection of anastomosis  . Ileostomy  02/16/2012    Procedure: ILEOSTOMY;  Surgeon: Zenovia Jarred, MD;  Location: [place];  Service: General;  Laterality: N/A;  . Application of wound vac  02/16/2012    Procedure: APPLICATION OF WOUND VAC;  Surgeon: Zenovia Jarred, MD;  Location: [place];  Service: General;  Laterality: N/A;  . Laparotomy  02/18/2012    Procedure: EXPLORATORY LAPAROTOMY;  Surgeon: Odis Hollingshead, MD;  Location: [place];  Service: General;  Laterality: N/A;  exploratory laparotomy  change of abdominal vac dressing  . Dressing change under anesthesia  02/18/2012    Procedure: DRESSING CHANGE UNDER ANESTHESIA;  Surgeon: Odis Hollingshead, MD;  Location: [place];  Service: General;  Laterality: N/A;  . Laparotomy  02/20/2012    Procedure: EXPLORATORY LAPAROTOMY;  Surgeon: Odis Hollingshead, MD;  Location: [place];  Service: General;  Laterality: N/A;  . Vacuum assisted closure change  02/20/2012    Procedure: ABDOMINAL VACUUM ASSISTED CLOSURE CHANGE;  Surgeon: Odis Hollingshead, MD;  Location: [place];  Service: General;;  . Laparotomy  02/23/2012    Procedure: EXPLORATORY LAPAROTOMY;  Surgeon: Joyice Faster. Cornett, MD;  Location: [place];  Service: General;  Laterality: N/A;  Irrigation and Debridement of abdominal wound with wound vac change with partial closure  . Incision and drainage of wound  02/23/2012    Procedure: IRRIGATION AND DEBRIDEMENT WOUND;  Surgeon: Joyice Faster. Cornett, MD;  Location: [place];  Service: General;  Laterality: N/A;  . Vacuum assisted closure change  02/23/2012    Procedure: ABDOMINAL VACUUM ASSISTED CLOSURE CHANGE;  Surgeon: Joyice Faster. Cornett, MD;  Location: [place];  Service: General;  Laterality: N/A;  . Laparotomy  02/26/2012    Procedure: EXPLORATORY LAPAROTOMY;  Surgeon: Imogene Burn. [name], MD;  Location: [place];  Service: General;  Laterality: N/A;     . Application of wound vac  02/26/2012    Procedure: APPLICATION OF WOUND VAC;  Surgeon:  Imogene Burn. [name], MD;  Location: [place];  Service: General;  Laterality: N/A;  . Colostomy reversal    . Iliostomy    . Resection small bowel / closure ileostomy  402/2014    Dr [name]  . Ileostomy closure N/A 09/02/2012    Procedure: ILEOSTOMY REVERSAL;  Surgeon: Imogene Burn. [name], MD;  Location: [place];  Service: General;  Laterality: N/A;   family history includes Diabetes in his father and sister; Heart disease in his father, mother, and sister. There is no history of Colon cancer. Current Outpatient Prescriptions  Medication Sig Dispense Refill  . amLODipine (NORVASC) 5 MG tablet Take 1 tablet (5 mg total) by mouth daily.  30 tablet  0  . Multiple Vitamin (MULTIVITAMIN) tablet Take 1 tablet by mouth daily.       No current facility-administered medications for this visit.   Allergies as of 06/28/2013  . (No Known Allergies)    reports that he has been smoking Cigarettes.  He has a 9 pack-year smoking history. He has never used smokeless tobacco. He reports that he drinks alcohol. He reports that he does not use illicit drugs.     Review of Systems: Pertinent positive and negative review of systems were noted in the above HPI section. All other review of systems were otherwise negative.  Vital signs were reviewed in today's medical record Physical Exam: General: Well developed , well nourished, no acute distress Skin: anicteric Head: Normocephalic and atraumatic Eyes:  sclerae anicteric, EOMI Ears: Normal auditory acuity Mouth: No deformity or lesions Neck: Supple, no masses or thyromegaly Lungs: Clear throughout to auscultation Heart: Regular rate and rhythm; no murmurs, rubs or bruits Abdomen: Soft, non tender and non distended. No masses, hepatosplenomegaly or hernias noted. Normal Bowel sounds Rectal:deferred Musculoskeletal: Symmetrical with no gross deformities  Skin: No lesions on visible extremities Pulses:  Normal pulses noted Extremities: No clubbing, cyanosis, edema  or deformities noted Neurological: Alert oriented x 4, grossly nonfocal Cervical Nodes:  No significant cervical adenopathy Inguinal Nodes: No significant inguinal adenopathy Psychological:  Alert and cooperative. Normal mood and affect  See Assessment and Plan under Problem List

## 2013-06-28 NOTE — Patient Instructions (Addendum)
You have been given a separate informational sheet regarding your tobacco use, the importance of quitting and local resources to help you quit. You have been scheduled for a colonoscopy with propofol. Please follow written instructions given to you at your visit today.  Please pick up your prep kit at the pharmacy within the next 1-3 days. If you use inhalers (even only as needed), please bring them with you on the day of your procedure. Your physician has requested that you go to www.startemmi.com and enter the access code given to you at your visit today. This web site gives a general overview about your procedure. However, you should still follow specific instructions given to you by our office regarding your preparation for the procedure.

## 2013-06-28 NOTE — Assessment & Plan Note (Signed)
Patient continues to do well without evidence for recurrent disease.  Plan is for followup colonoscopy.

## 2013-06-28 NOTE — Addendum Note (Signed)
Addended by: Oda Kilts on: 06/28/2013 03:29 PM   Modules accepted: Orders

## 2013-07-05 ENCOUNTER — Telehealth: Payer: Self-pay | Admitting: Licensed Clinical Social Worker

## 2013-07-05 NOTE — Telephone Encounter (Signed)
CSW received PCS request on behalf of pt.  Placed call to Mr. Krupa to determine exact needs.  Pt states he is in need of assistance with dressing and bathing.  Currently, pt states he has a friend that comes over to help.  Mr. Laible states he becomes light-headed when he tries to completed his ADL's on his own.  CSW inquired, that pt stated he was independent on ADL's during last [facility] appt.  Mr. Meredith stated "I say that because last time they put me in a nursing home".  CSW informed Mr. Blauer will forward to PCP.

## 2013-07-28 ENCOUNTER — Encounter: Payer: Medicaid Other | Admitting: Gastroenterology

## 2013-08-16 ENCOUNTER — Ambulatory Visit (AMBULATORY_SURGERY_CENTER): Payer: Medicaid Other | Admitting: Gastroenterology

## 2013-08-16 ENCOUNTER — Encounter: Payer: Self-pay | Admitting: Gastroenterology

## 2013-08-16 VITALS — BP 160/97 | HR 60 | Temp 98.0°F | Resp 35 | Ht 70.0 in | Wt 243.0 lb

## 2013-08-16 DIAGNOSIS — Z85038 Personal history of other malignant neoplasm of large intestine: Secondary | ICD-10-CM

## 2013-08-16 DIAGNOSIS — C189 Malignant neoplasm of colon, unspecified: Secondary | ICD-10-CM

## 2013-08-16 DIAGNOSIS — Z8500 Personal history of malignant neoplasm of unspecified digestive organ: Secondary | ICD-10-CM

## 2013-08-16 MED ORDER — SODIUM CHLORIDE 0.9 % IV SOLN: 500.0000 mL | INTRAVENOUS | Status: DC

## 2013-08-16 NOTE — Op Note (Signed)
[facility]  [address] [place], [address]   COLONOSCOPY PROCEDURE REPORT  PATIENT: [name], [name]  MR#: [id number] BIRTHDATE: 1950-08-21 , 29  yrs. old GENDER: Male ENDOSCOPIST: Inda Castle, MD REFERRED [place]:[id number] Edrick Kins, M.D. PROCEDURE DATE:  08/16/2013 PROCEDURE:   Colonoscopy, diagnostic First Screening Colonoscopy - Avg.  risk and is 50 yrs.  old or older - No.  Prior Negative Screening - Now for repeat screening. N/A  History of Adenoma - Now for follow-up colonoscopy & has been > or = to 3 yrs.  N/A  Polyps Removed Today? No.  Recommend repeat exam, <10 yrs? Yes.  High risk (family or personal hx). ASA CLASS:   Class II INDICATIONS:High risk patient with personal history of colon cancer. 2013 MEDICATIONS: MAC sedation, administered by CRNA and propofol (Diprivan) 150mg  IV  DESCRIPTION OF PROCEDURE:   After the risks benefits and alternatives of the procedure were thoroughly explained, informed consent was obtained.  A digital rectal exam revealed no abnormalities of the rectum.   The [id number]  endoscope was introduced through the anus and advanced to the surgical anastomosis. No adverse events experienced.   The quality of the prep was Suprep good  The instrument was then slowly withdrawn as the colon was fully examined.      COLON FINDINGS: A normal appearing cecum, ileocecal valve, and appendiceal orifice were identified.  The ascending, hepatic flexure, transverse, splenic flexure, descending, sigmoid colon and rectum appeared unremarkable.  No polyps or cancers were seen. Retroflexed views revealed no abnormalities. The time to anastamosis=2 minutes 31 seconds.  Withdrawal time=6 minutes 10 seconds.  The scope was withdrawn and the procedure completed. COMPLICATIONS: There were no complications.  ENDOSCOPIC IMPRESSION: Normal colon  RECOMMENDATIONS: Colonoscopy 3 years   eSigned:  Inda Castle, MD  08/16/2013 9:17 AM   cc:

## 2013-08-16 NOTE — Patient Instructions (Signed)
YOU HAD AN ENDOSCOPIC PROCEDURE TODAY AT THE [facility]: Refer to the procedure report that was given to you for any specific questions about what was found during the examination.  If the procedure report does not answer your questions, please call your gastroenterologist to clarify.  If you requested that your care partner not be given the details of your procedure findings, then the procedure report has been included in a sealed envelope for you to review at your convenience later.  YOU SHOULD EXPECT: Some feelings of bloating in the abdomen. Passage of more gas than usual.  Walking can help get rid of the air that was put into your GI tract during the procedure and reduce the bloating. If you had a lower endoscopy (such as a colonoscopy or flexible sigmoidoscopy) you may notice spotting of blood in your stool or on the toilet paper. If you underwent a bowel prep for your procedure, then you may not have a normal bowel movement for a few days.  DIET: Your first meal following the procedure should be a light meal and then it is ok to progress to your normal diet.  A half-sandwich or bowl of soup is an example of a good first meal.  Heavy or fried foods are harder to digest and may make you feel nauseous or bloated.  Likewise meals heavy in dairy and vegetables can cause extra gas to form and this can also increase the bloating.  Drink plenty of fluids but you should avoid alcoholic beverages for 24 hours.  ACTIVITY: Your care partner should take you home directly after the procedure.  You should plan to take it easy, moving slowly for the rest of the day.  You can resume normal activity the day after the procedure however you should NOT DRIVE or use heavy machinery for 24 hours (because of the sedation medicines used during the test).    SYMPTOMS TO REPORT IMMEDIATELY: A gastroenterologist can be reached at any hour.  During normal business hours, 8:30 AM to 5:00 PM Monday through Friday,  call [phone].  After hours and on weekends, please call the GI answering service at [phone] who will take a message and have the physician on call contact you.   Following lower endoscopy (colonoscopy or flexible sigmoidoscopy):  Excessive amounts of blood in the stool  Significant tenderness or worsening of abdominal pains  Swelling of the abdomen that is new, acute  Fever of 100F or higher    FOLLOW UP: If any biopsies were taken you will be contacted by phone or by letter within the next 1-3 weeks.  Call your gastroenterologist if you have not heard about the biopsies in 3 weeks.  Our staff will call the home number listed on your records the next business day following your procedure to check on you and address any questions or concerns that you may have at that time regarding the information given to you following your procedure. This is a courtesy call and so if there is no answer at the home number and we have not heard from you through the emergency physician on call, we will assume that you have returned to your regular daily activities without incident.  SIGNATURES/CONFIDENTIALITY: You and/or your care partner have signed paperwork which will be entered into your electronic medical record.  These signatures attest to the fact that that the information above on your After Visit Summary has been reviewed and is understood.  Full responsibility of the confidentiality   of this discharge information lies with you and/or your care-partner.   Normal colonoscopy.  Recall colonoscopy three years-2018.

## 2013-08-16 NOTE — Progress Notes (Signed)
A/ox3 pleased with MAC, report to Jane RN 

## 2013-08-17 ENCOUNTER — Telehealth: Payer: Self-pay

## 2013-08-17 NOTE — Telephone Encounter (Signed)
No answer, left vm 

## 2013-09-15 ENCOUNTER — Encounter: Payer: Self-pay | Admitting: Internal Medicine

## 2013-09-15 ENCOUNTER — Ambulatory Visit (INDEPENDENT_AMBULATORY_CARE_PROVIDER_SITE_OTHER): Payer: Self-pay | Admitting: Internal Medicine

## 2013-09-15 VITALS — BP 153/86 | HR 72 | Temp 98.5°F | Ht 71.0 in | Wt 231.6 lb

## 2013-09-15 DIAGNOSIS — E1165 Type 2 diabetes mellitus with hyperglycemia: Secondary | ICD-10-CM

## 2013-09-15 DIAGNOSIS — IMO0002 Reserved for concepts with insufficient information to code with codable children: Secondary | ICD-10-CM | POA: Insufficient documentation

## 2013-09-15 DIAGNOSIS — I10 Essential (primary) hypertension: Secondary | ICD-10-CM

## 2013-09-15 DIAGNOSIS — IMO0001 Reserved for inherently not codable concepts without codable children: Secondary | ICD-10-CM

## 2013-09-15 DIAGNOSIS — E118 Type 2 diabetes mellitus with unspecified complications: Secondary | ICD-10-CM

## 2013-09-15 DIAGNOSIS — E119 Type 2 diabetes mellitus without complications: Secondary | ICD-10-CM

## 2013-09-15 LAB — COMPLETE METABOLIC PANEL WITH GFR
ALT: 23 U/L (ref 0–53)
AST: 36 U/L (ref 0–37)
Albumin: 3.1 g/dL — ABNORMAL LOW (ref 3.5–5.2)
Alkaline Phosphatase: 78 U/L (ref 39–117)
BUN: 17 mg/dL (ref 6–23)
CO2: 22 mEq/L (ref 19–32)
Calcium: 9.3 mg/dL (ref 8.4–10.5)
Chloride: 99 mEq/L (ref 96–112)
Creat: 1.9 mg/dL — ABNORMAL HIGH (ref 0.50–1.35)
GFR, Est African American: 43 mL/min — ABNORMAL LOW
GFR, Est Non African American: 37 mL/min — ABNORMAL LOW
Glucose, Bld: 618 mg/dL (ref 70–99)
Potassium: 4.9 mEq/L (ref 3.5–5.3)
Sodium: 132 mEq/L — ABNORMAL LOW (ref 135–145)
Total Bilirubin: 0.4 mg/dL (ref 0.3–1.2)
Total Protein: 7.2 g/dL (ref 6.0–8.3)

## 2013-09-15 LAB — GLUCOSE, CAPILLARY: Glucose-Capillary: >600 mg/dL (ref 70–99)

## 2013-09-15 LAB — CBC WITH DIFFERENTIAL/PLATELET
Basophils Absolute: 0 10*3/uL (ref 0.0–0.1)
Basophils Relative: 0 % (ref 0–1)
Eosinophils Absolute: 0.1 10*3/uL (ref 0.0–0.7)
Eosinophils Relative: 2 % (ref 0–5)
HCT: 33.5 % — ABNORMAL LOW (ref 39.0–52.0)
Hemoglobin: 12.1 g/dL — ABNORMAL LOW (ref 13.0–17.0)
Lymphocytes Relative: 31 % (ref 12–46)
Lymphs Abs: 2.1 10*3/uL (ref 0.7–4.0)
MCH: 25.3 pg — ABNORMAL LOW (ref 26.0–34.0)
MCHC: 36.1 g/dL — ABNORMAL HIGH (ref 30.0–36.0)
MCV: 70.1 fL — ABNORMAL LOW (ref 78.0–100.0)
Monocytes Absolute: 0.3 10*3/uL (ref 0.1–1.0)
Monocytes Relative: 5 % (ref 3–12)
Neutro Abs: 4.2 10*3/uL (ref 1.7–7.7)
Neutrophils Relative %: 62 % (ref 43–77)
Platelets: 216 10*3/uL (ref 150–400)
RBC: 4.78 MIL/uL (ref 4.22–5.81)
RDW: 14.7 % (ref 11.5–15.5)
WBC: 6.8 10*3/uL (ref 4.0–10.5)

## 2013-09-15 LAB — URINALYSIS, COMPLETE
Bilirubin Urine: NEGATIVE
Glucose, UA: >1000 mg/dL — AB
Ketones, ur: NEGATIVE mg/dL
Leukocytes, UA: NEGATIVE
Nitrite: NEGATIVE
Protein, ur: 100 mg/dL — AB
Specific Gravity, Urine: 1.035 — ABNORMAL HIGH (ref 1.005–1.030)
Urobilinogen, UA: 0.2 mg/dL (ref 0.0–1.0)
pH: 6 (ref 5.0–8.0)

## 2013-09-15 LAB — POCT GLYCOSYLATED HEMOGLOBIN (HGB A1C): Hemoglobin A1C: >14

## 2013-09-15 MED ORDER — AMLODIPINE BESYLATE 5 MG PO TABS: 5.0000 mg | ORAL_TABLET | Freq: Every day | ORAL | Status: DC

## 2013-09-15 MED ORDER — INSULIN DETEMIR 100 UNIT/ML ~~LOC~~ SOLN: 10.0000 [IU] | Freq: Every day | SUBCUTANEOUS | Status: DC

## 2013-09-15 NOTE — Patient Instructions (Addendum)
Take Insulin 10 units daily at bedtime. Check your blood sugars 4 times daily: before each meal and at bedtime. Increase your Insulin by 2 units if your fasting blood sugars are more than 200. Follow up with the clinic on Friday. If your symptoms worsen, or do not improve seek medical help immediately.
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
## 2013-09-15 NOTE — Assessment & Plan Note (Signed)
Severe asymptomatic HTN secondary to medication non-compliance. Repeat BP in the office was 170/90 His symptoms of light headedness could be secondary to uncontrolled HTN, along with his severe hyperglycemia. See my A/P for DM.  Plans: Restart Norvasc 5 mg. Bonna Gains provided the patient 3 dollars for his co-pay as he stated he didn't have the money for co-pay.

## 2013-09-15 NOTE — Assessment & Plan Note (Addendum)
Severe Hyperglycemia with no AG metabolic acidosis and K abnormalities. In the absence of any diabetic treatment regimen for the last one year. Patient declines to wait till the stat BMP is done, to see if he is in DKA. Patient left against medical advise. AMA papers are signed by the patient. Patient understands the consequences and dangers of leaving AMA and clearly states that he wants to leave. Discussed with the attending and [name] regarding further management.  Plans:  Start Long acting Insulin therapy at 10 units SQ QHS. Sample of Levemir from the clinic was provided to the patient that should last about a month. 10 units of Levemir was given to the patient in the clinic. Recommended to check blood sugars at least four times daily, before breakfast, lunch, dinner and at bedtime. After the patient left AMA, I called the patient and talked to him regarding the labs and recommended him to check blood sugars before breakfast, lunch, dinner and at bedtime.  Follow up appointment was made in 2 days and informed patient about the date and time of appointment.  Informed patient to seek medical help if his symptoms worsen.

## 2013-09-17 ENCOUNTER — Encounter: Payer: Self-pay | Admitting: Internal Medicine

## 2013-09-17 ENCOUNTER — Ambulatory Visit (INDEPENDENT_AMBULATORY_CARE_PROVIDER_SITE_OTHER): Payer: Self-pay | Admitting: Internal Medicine

## 2013-09-17 VITALS — BP 140/80 | HR 72 | Temp 98.3°F | Ht 71.0 in | Wt 232.4 lb

## 2013-09-17 DIAGNOSIS — IMO0002 Reserved for concepts with insufficient information to code with codable children: Secondary | ICD-10-CM

## 2013-09-17 DIAGNOSIS — E118 Type 2 diabetes mellitus with unspecified complications: Secondary | ICD-10-CM

## 2013-09-17 DIAGNOSIS — E1165 Type 2 diabetes mellitus with hyperglycemia: Secondary | ICD-10-CM

## 2013-09-17 DIAGNOSIS — I10 Essential (primary) hypertension: Secondary | ICD-10-CM

## 2013-09-17 DIAGNOSIS — IMO0001 Reserved for inherently not codable concepts without codable children: Secondary | ICD-10-CM

## 2013-09-17 DIAGNOSIS — E119 Type 2 diabetes mellitus without complications: Secondary | ICD-10-CM

## 2013-09-17 LAB — GLUCOSE, CAPILLARY: Glucose-Capillary: 572 mg/dL (ref 70–99)

## 2013-09-17 MED ORDER — INSULIN DETEMIR 100 UNIT/ML ~~LOC~~ SOLN: 24.0000 [IU] | Freq: Every day | SUBCUTANEOUS | Status: DC

## 2013-09-17 NOTE — Progress Notes (Signed)
Subjective:   Patient ID: [name]. male   DOB: 1951-03-03 63 y.o.   MRN: [id number]  HPI: [name]. is a 63 y.o. gentleman with PMH significant for HTN, History of DM, H/O Colon cancer s/p right hemicolectomy Comes to the office for a follow up of his DM/ previous office visit.   Patient was seen here in clinic on 09/15/13 for symptoms of dizziness and lightheadedness and was found to be severely hyperglycemic with CBG of 618 and an A1C of >14.0. Patient was not taking any medications for DM at that point and was started on Levemir 10 units and was asked to increase by 2 units every day for fasting CBG >200. Patient was advised to wait for the stat BMP to rule out the possibility of DKA but patient left signed and left AMA. Patient reports that he took 10 U, 12 U in AM, 14 U in AM on 4/15,4/16,4/17 respectively.  Patient reports that his dizziness and lightheadedness have resolved and he is feeling better over all. Patient reports that he has been checking his fasting blood sugars daily and the numbers are 509, 418 on 4/16 and 4/17 respectively.   Patient denies any other complaints today.  Past Medical History  Diagnosis Date  . Hyperlipidemia   . Hypertension     for a few years and resolved in 2013/2014.  Marland Kitchen Heart murmur   . DM II (diabetes mellitus, type II), controlled     type 2 IDDM x 15 years. A1C 1/09 13.7.   Marland Kitchen Sleep apnea     does not wear CPAP now  . 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 Arthritis     left knee  . 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 12/25/2011  . Chronic kidney disease     acute renal failure/injury requiring short-term HD 2013  . 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 Colon cancer    Current Outpatient Prescriptions  Medication Sig Dispense Refill  . amLODipine (NORVASC) 5 MG tablet Take 1 tablet (5 mg total) by mouth daily.  30 tablet  11  . Ascorbic Acid (VITAMIN C PO) Take by mouth.       . insulin detemir (LEVEMIR) 100 UNIT/ML injection Inject 0.1 mLs (10 Units total) into the skin at bedtime.  10 mL  11  . Multiple Vitamin (MULTIVITAMIN) tablet Take 1 tablet by mouth daily.       No current facility-administered medications for this visit.   Family History  Problem Relation Age of Onset  . Diabetes Father   . Heart disease Father   . Colon cancer Neg Hx   . Diabetes Sister   . Heart disease Sister   . Heart disease Mother    History   Social History  . Marital Status: Divorced    Spouse Name: N/A    Number of Children: 3  . Years of Education: N/A   Occupational History  .      Worked at [employer] Topics  . Smoking status: Current Every Day Smoker -- 0.50 packs/day for 30 years    Types: Cigarettes  . Smokeless tobacco: Never Used  . Alcohol Use: Yes     Comment: Liquor twice monthly.  . Drug Use: No  . Sexual Activity: None   Other Topics Concern  . None   Social History Narrative   Financial assistance approved for 100%  discount at [facility] and has [facility] card   [facility]  August 08, 2009 5:48 PM   *PATIENT WAS GIVEN DM CARD.Lela Sturdivant NT II  June 08, 2010 3:56 PM      Divorced, one daughter currently staying with him   Has 7 grand children   Reports disability and medicaid is pending   Review of Systems: Pertinent items are noted in HPI. Objective:  Physical Exam: Filed Vitals:   09/17/13 1107 09/17/13 1131  BP: 157/89 140/80  Pulse: 69 72  Temp: 98.3 F (36.8 C)   TempSrc: Oral   Height: 5\' 11"  (1.803 m)   Weight: 232 lb 6.4 oz (105.416 kg)   SpO2: 99%    Constitutional: Vital signs reviewed.   Patient is a well-developed and well-nourished and is in no acute distress. Patient is cooperative with exam.  Alert and oriented x3.  Cardiovascular: RRR, S1 normal, S2 normal, no MRG, pulses symmetric and intact bilaterally Pulmonary/Chest: normal respiratory effort, CTAB, no wheezes, rales, or rhonchi No  pedal edema noted.  Assessment & Plan:

## 2013-09-17 NOTE — Assessment & Plan Note (Signed)
BP slightly elevated. Patient still hasn't started taking his Norvasc even though the clinic provided him the co-pay of 3 dollars. Recommended patient to start taking the medicine starting today.  Plans: Continue current strength Norvasc.

## 2013-09-17 NOTE — Patient Instructions (Addendum)
Take 10 units of Levemir Insulin at bed time today (09/17/13). Take 24 units of Levemir Insulin once daily at bed time everyday, until you see me in the office. Do not increase Insulin without informing us. Check your blood sugars four times daily:  Before breakfast, lunch, dinner and at bedtime. Start taking your BP medication. If your blood sugar is lower than 60, please call us or seek medical help.

## 2013-09-17 NOTE — Assessment & Plan Note (Signed)
Uncontrolled DM. Started back on treatment on 4/15. Will need to switch the Levemir administration from morning to evening. Discussed with the attending and came up with the follow up plan.  Plans: Take 10 units of Levemir at bedtime today. (09/17/13) (Patient already took 14 units in AM, so that will make it 24 units total for today) Take 24 units of Levemir at bedtime, once daily starting tomorrow. Advised patient to check his blood sugars 4 times daily: before breakfast, lunch, dinner and at bedtime. Will follow up on 09/22/13 Recommended patient not to increase insulin without informing us. Patient would probably benefit from addition of pre-meal sliding scale insulin. But we will consider this option during the future office visits.

## 2013-09-20 NOTE — Progress Notes (Signed)
INTERNAL MEDICINE TEACHING ATTENDING ADDENDUM - Lanasia Porras, MD: I reviewed and discussed at the time of visit with the resident Dr. Boggala, the patient's medical history, physical examination, diagnosis and results of tests and treatment and I agree with the patient's care as documented. 

## 2013-09-22 ENCOUNTER — Ambulatory Visit: Payer: Medicaid Other | Admitting: Internal Medicine

## 2013-09-22 ENCOUNTER — Telehealth: Payer: Self-pay | Admitting: Internal Medicine

## 2013-09-22 NOTE — Telephone Encounter (Signed)
Note created twice by mistake.

## 2013-09-22 NOTE — Telephone Encounter (Addendum)
Patient had an appointment to see me today at 9:15 and was a no show to the appointment. I gave him a call and talked to him over the phone. He stated that he forgot about the appointment. I made an appointment for him at 3:15 and informed him about the appointment. He said he will come to the appointment. When I asked him about how his blood sugars are running, he said he had "high blood sugars" and started taking the Levemir 20 units in the morning and 20 units in the evening. I told him that he should never take the Levemir insulin twice a day and that he shouldn't adjust his insulin without informing his physicians. Patient verbalizes understanding of the potential dangers of taking Insulin twice a day without physicians approval.

## 2013-09-22 NOTE — Telephone Encounter (Signed)
Patient came to the clinic front desk for his 3:15PM appointment and left without being seen by a nurse or physician. I called patient at his mobile number and he stated that he had another appointment that he had to go so he left. Patient stated that he took 12 units of Levemir in AM and 12 units of Levemir at night on 09/21/13 and he took 20 units of Levemir this morning (4/22). I told him that he should never take Levemir twice a day and I asked him not to take Levemir tonight. He verbalizes understanding of the recommendations. I informed him that he should not adjust his insulin without informing his physicians. I told him to call the clinic in the morning to schedule another appointment to be seen in 1-2 days.  Patient is not compliant with his appointments and his instructions on how to take his medicines. He understands the potential dangers of not following instructions on Levemir.

## 2013-09-23 NOTE — Progress Notes (Signed)
Case discussed with Dr. Boggala at the time of the visit.  We reviewed the resident's history and exam and pertinent patient test results.  I agree with the assessment, diagnosis, and plan of care documented in the resident's note. 

## 2013-11-02 ENCOUNTER — Encounter: Payer: Self-pay | Admitting: Internal Medicine

## 2013-11-03 ENCOUNTER — Encounter: Payer: Self-pay | Admitting: Internal Medicine

## 2013-11-14 ENCOUNTER — Emergency Department (HOSPITAL_COMMUNITY)
Admission: EM | Admit: 2013-11-14 | Discharge: 2013-11-14 | Disposition: A | Discharge status: Home / Self Care | Payer: Self-pay | Attending: Emergency Medicine | Admitting: Emergency Medicine

## 2013-11-14 ENCOUNTER — Encounter (HOSPITAL_COMMUNITY): Payer: Self-pay | Admitting: Emergency Medicine

## 2013-11-14 ENCOUNTER — Emergency Department (HOSPITAL_COMMUNITY): Payer: Self-pay

## 2013-11-14 DIAGNOSIS — I129 Hypertensive chronic kidney disease with stage 1 through stage 4 chronic kidney disease, or unspecified chronic kidney disease: Secondary | ICD-10-CM | POA: Insufficient documentation

## 2013-11-14 DIAGNOSIS — F17200 Nicotine dependence, unspecified, uncomplicated: Secondary | ICD-10-CM | POA: Insufficient documentation

## 2013-11-14 DIAGNOSIS — Z862 Personal history of diseases of the blood and blood-forming organs and certain disorders involving the immune mechanism: Secondary | ICD-10-CM | POA: Insufficient documentation

## 2013-11-14 DIAGNOSIS — Z860100 Personal history of colon polyps, unspecified: Secondary | ICD-10-CM | POA: Insufficient documentation

## 2013-11-14 DIAGNOSIS — S43006A Unspecified dislocation of unspecified shoulder joint, initial encounter: Secondary | ICD-10-CM

## 2013-11-14 DIAGNOSIS — Z794 Long term (current) use of insulin: Secondary | ICD-10-CM | POA: Insufficient documentation

## 2013-11-14 DIAGNOSIS — S43016A Anterior dislocation of unspecified humerus, initial encounter: Secondary | ICD-10-CM | POA: Insufficient documentation

## 2013-11-14 DIAGNOSIS — S43036A Inferior dislocation of unspecified humerus, initial encounter: Secondary | ICD-10-CM | POA: Insufficient documentation

## 2013-11-14 DIAGNOSIS — Y9389 Activity, other specified: Secondary | ICD-10-CM | POA: Insufficient documentation

## 2013-11-14 DIAGNOSIS — N189 Chronic kidney disease, unspecified: Secondary | ICD-10-CM | POA: Insufficient documentation

## 2013-11-14 DIAGNOSIS — W010XXA Fall on same level from slipping, tripping and stumbling without subsequent striking against object, initial encounter: Secondary | ICD-10-CM | POA: Insufficient documentation

## 2013-11-14 DIAGNOSIS — Z79899 Other long term (current) drug therapy: Secondary | ICD-10-CM | POA: Insufficient documentation

## 2013-11-14 DIAGNOSIS — Z8639 Personal history of other endocrine, nutritional and metabolic disease: Secondary | ICD-10-CM | POA: Insufficient documentation

## 2013-11-14 DIAGNOSIS — R011 Cardiac murmur, unspecified: Secondary | ICD-10-CM | POA: Insufficient documentation

## 2013-11-14 DIAGNOSIS — Y929 Unspecified place or not applicable: Secondary | ICD-10-CM | POA: Insufficient documentation

## 2013-11-14 DIAGNOSIS — Z85038 Personal history of other malignant neoplasm of large intestine: Secondary | ICD-10-CM | POA: Insufficient documentation

## 2013-11-14 DIAGNOSIS — Z8739 Personal history of other diseases of the musculoskeletal system and connective tissue: Secondary | ICD-10-CM | POA: Insufficient documentation

## 2013-11-14 DIAGNOSIS — E119 Type 2 diabetes mellitus without complications: Secondary | ICD-10-CM | POA: Insufficient documentation

## 2013-11-14 IMAGING — CR DG SHOULDER 2+V*L*
2 series · 2 of 2 positions shown · non-contrast
Comparison: None.

CLINICAL DATA: Left shoulder pain and deformity.

EXAM:
LEFT SHOULDER - 2+ VIEW

[w shoulder internal left]
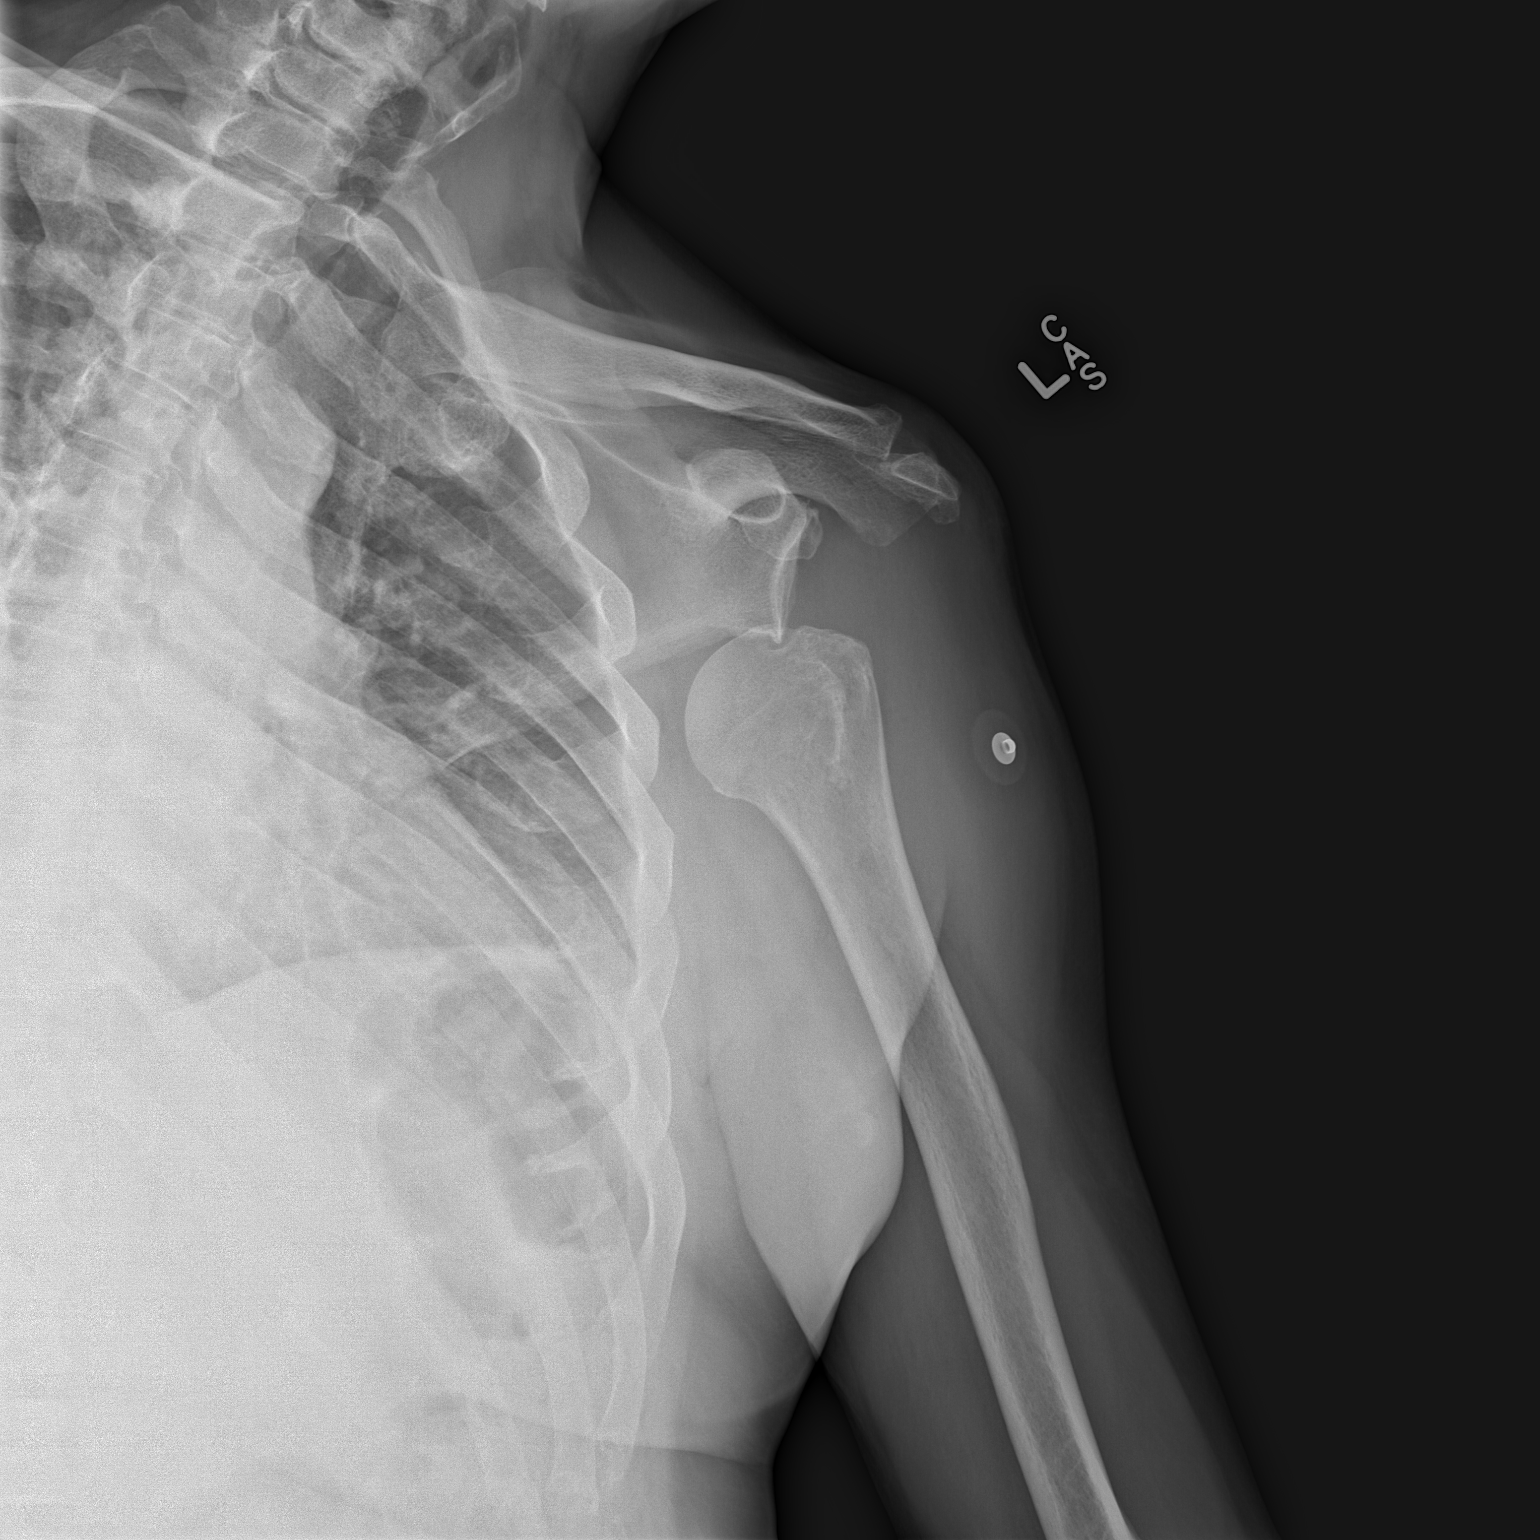

[w shoulder y-view left]
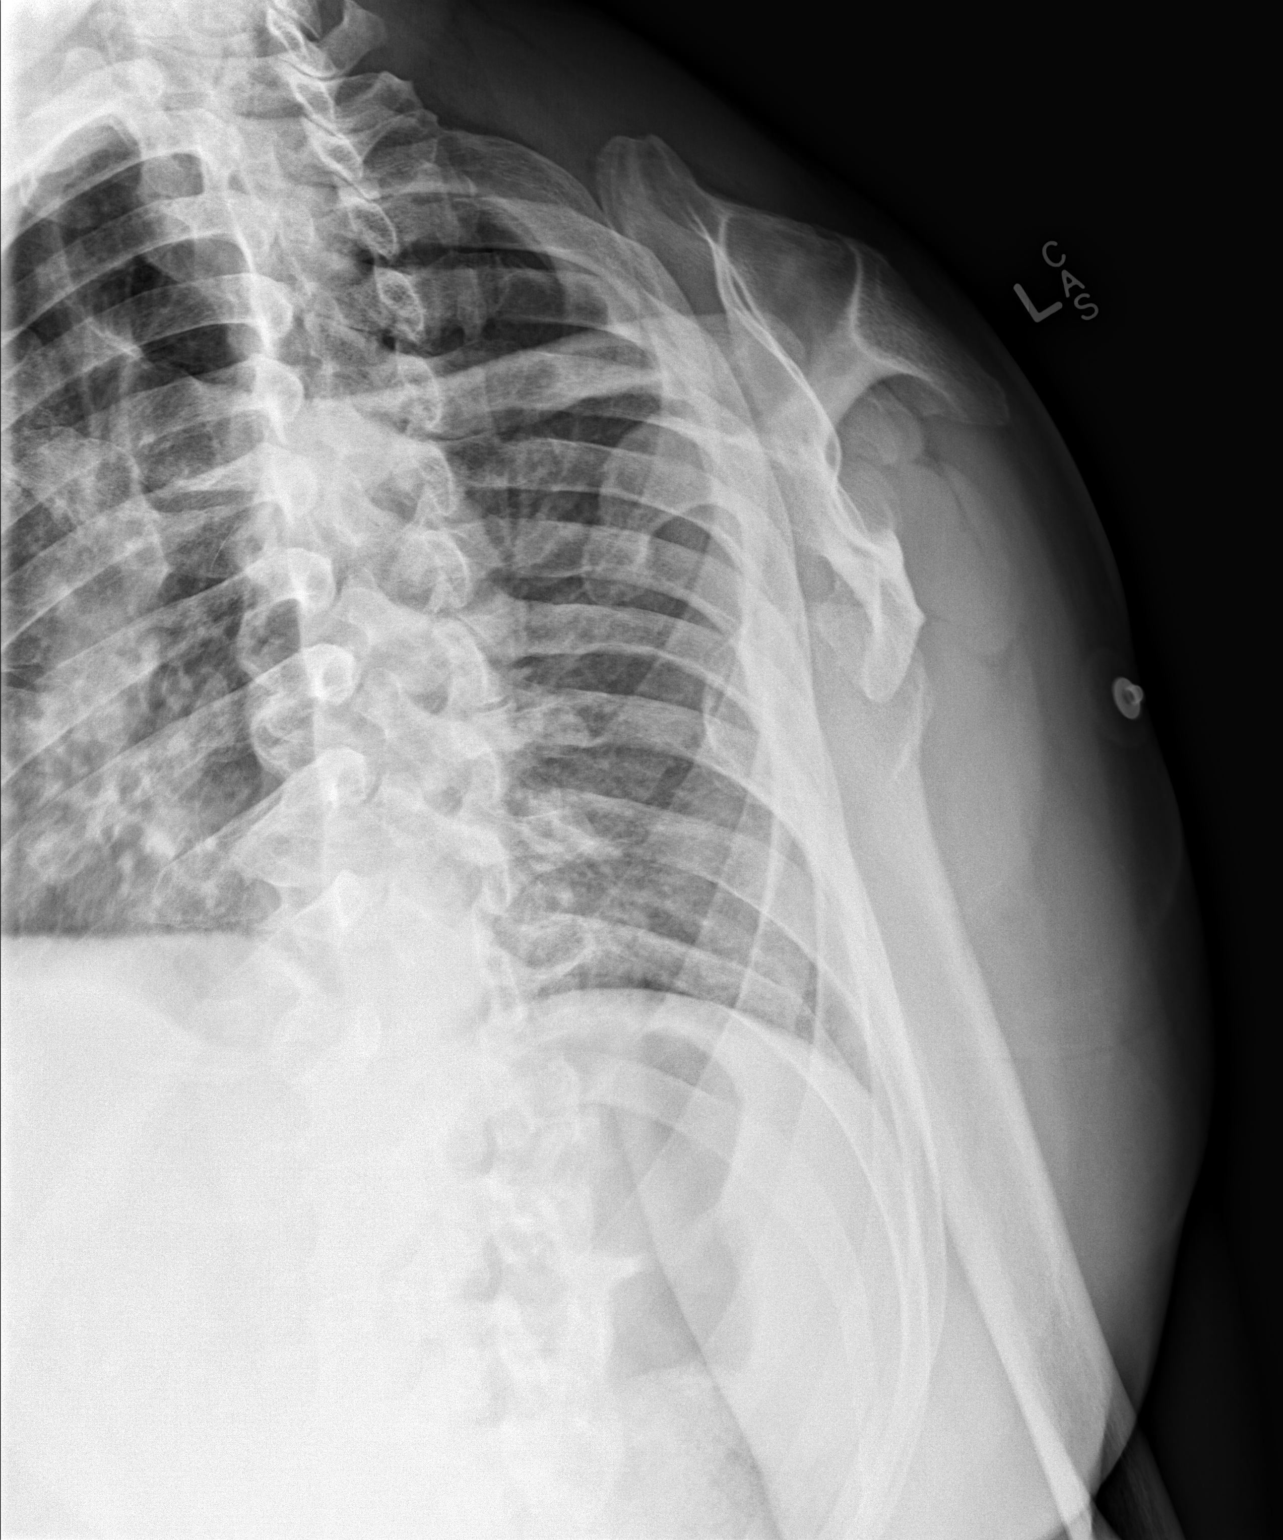

[2 of 2 positions shown; findings below may reference images not displayed]

FINDINGS: There is anterior-inferior dislocation of the left humeral head,
with an associated focal Hill-Sachs lesion. The inferior glenoid is
tightly lodged at the Hill-Sachs defect, without a definite osseous
Bankart lesion. Given the appearance, inferior labral injury is
suspected.

No additional fractures are seen. The left acromioclavicular joint
is grossly unremarkable in appearance. The visualized portions of
the lungs are grossly clear. No significant soft tissue
abnormalities are characterized on radiograph.
IMPRESSION: Anterior-inferior dislocation of the left humeral head, with focal
Hill-Sachs lesion. The inferior glenoid is tightly lodged at the
Hill-Sachs defect, without a definite osseous Bankart lesion. Given
the appearance, inferior labral injury is suspected.

## 2013-11-14 IMAGING — DX DG SHOULDER 1V*L*
1 series · 1 of 1 positions shown · non-contrast
Comparison: None.

CLINICAL DATA: Status post reduction of left shoulder dislocation.

EXAM:
PORTABLE LEFT SHOULDER - 2+ VIEW

[AP]
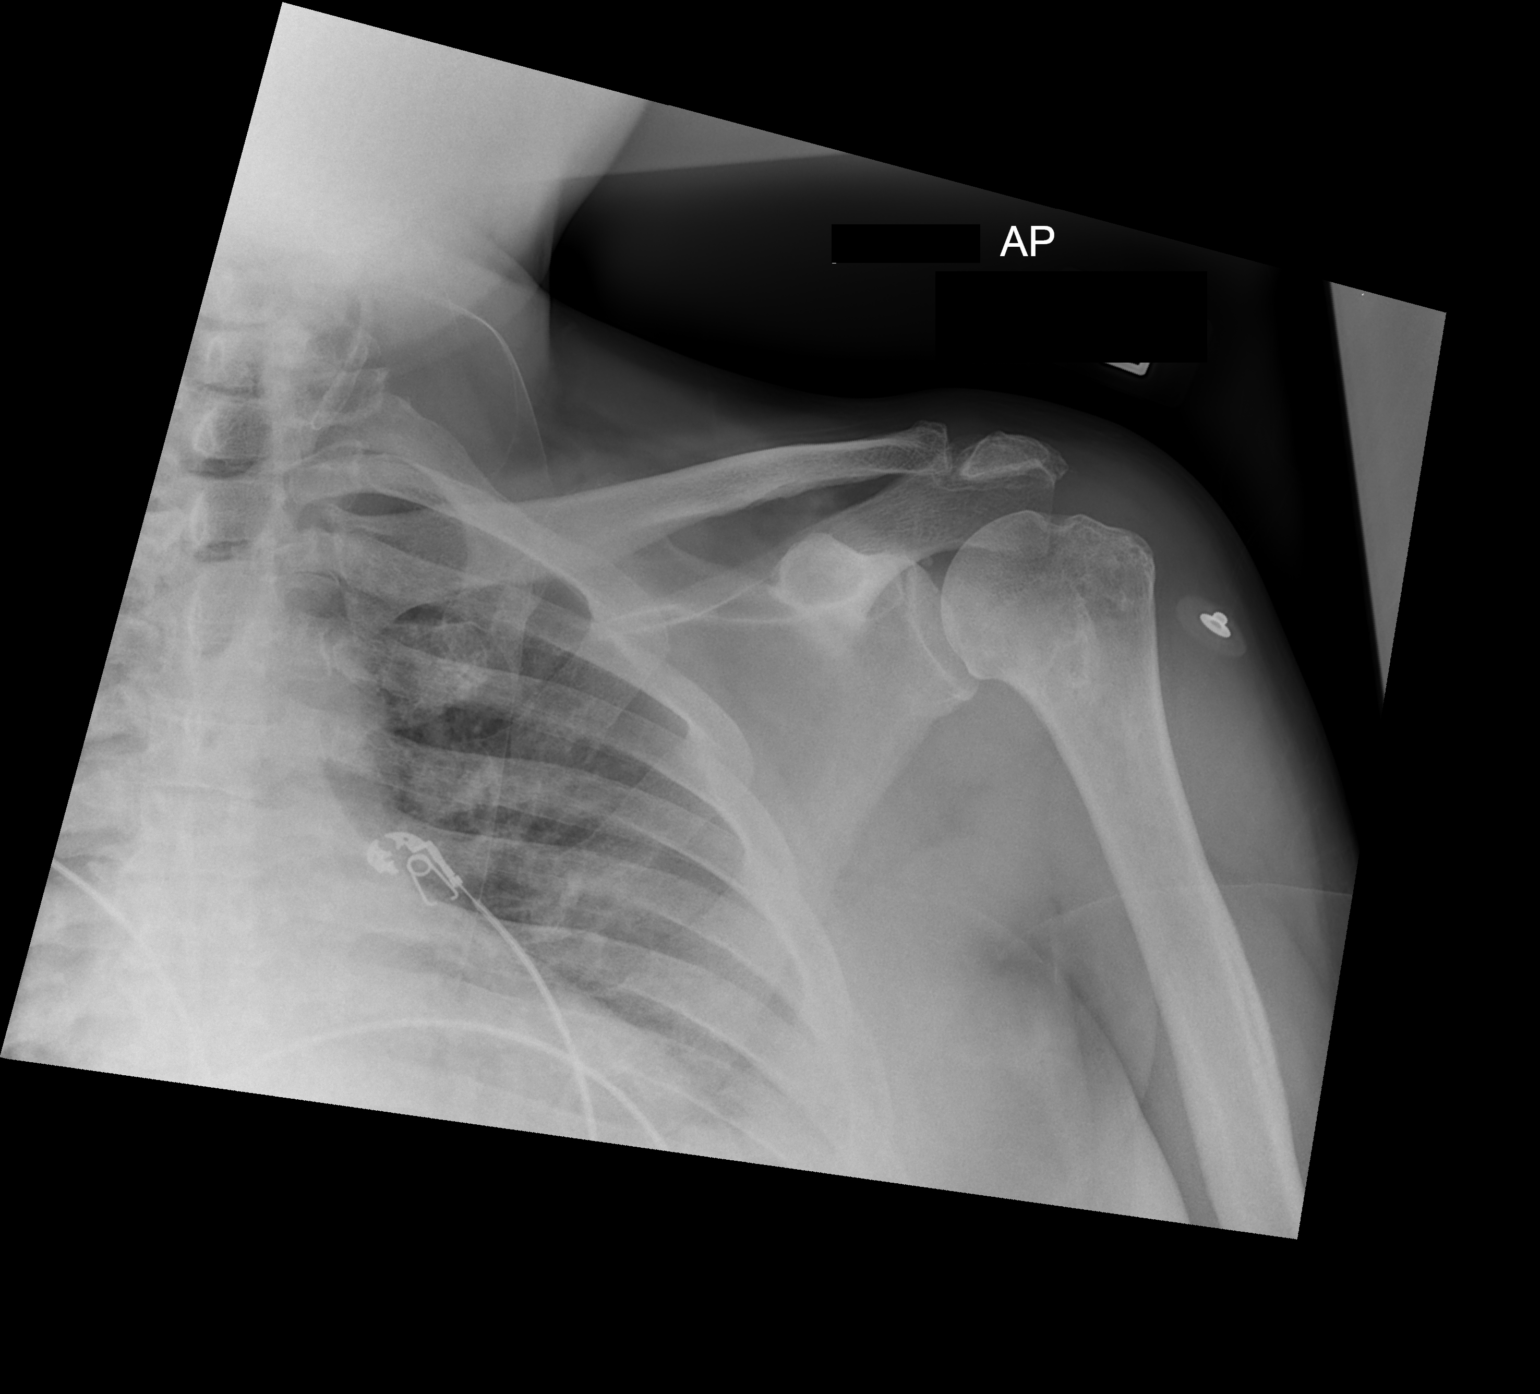

[1 of 1 positions shown; findings below may reference images not displayed]

FINDINGS: There has been successful reduction of the left humeral head
dislocation. A focal Hill-Sachs lesion is again seen. No osseous
Bankart lesion is identified. No additional fractures are seen. The
left acromioclavicular joint demonstrates minimal degenerative
change.

No significant soft tissue abnormalities are identified. The
visualized portions of the lungs are clear.
IMPRESSION: Successful reduction of left humeral head dislocation. Focal
Hill-Sachs lesion again seen. No osseous Bankart lesion identified
at this time.

## 2013-11-14 MED ORDER — ETOMIDATE 2 MG/ML IV SOLN
INTRAVENOUS | Status: AC | PRN
  Administered 2013-11-14: 8 mg via INTRAVENOUS

## 2013-11-14 MED ORDER — OXYCODONE-ACETAMINOPHEN 5-325 MG PO TABS: 1.0000 | ORAL_TABLET | Freq: Four times a day (QID) | ORAL | Status: DC | PRN

## 2013-11-14 MED ORDER — SODIUM CHLORIDE 0.9 % IV SOLN
INTRAVENOUS | Status: AC | PRN
  Administered 2013-11-14: 100 mL/h via INTRAVENOUS

## 2013-11-14 MED ORDER — ETOMIDATE 2 MG/ML IV SOLN
10.0000 mg | Freq: Once | INTRAVENOUS | Status: AC
  Administered 2013-11-14: 10 mg via INTRAVENOUS
  Filled 2013-11-14: qty 10

## 2013-11-14 MED ORDER — OXYCODONE-ACETAMINOPHEN 5-325 MG PO TABS
1.0000 | ORAL_TABLET | Freq: Once | ORAL | Status: AC
  Administered 2013-11-14: 1 via ORAL
  Filled 2013-11-14: qty 1

## 2013-11-14 MED ORDER — MORPHINE SULFATE 4 MG/ML IJ SOLN
4.0000 mg | Freq: Once | INTRAMUSCULAR | Status: AC
  Administered 2013-11-14: 4 mg via INTRAVENOUS
  Filled 2013-11-14: qty 1

## 2013-11-14 NOTE — ED Notes (Signed)
Bed: WA01 Expected date:  Expected time:  Means of arrival:  Comments: EMS 10M fall, L shoulder pain

## 2013-11-14 NOTE — ED Notes (Signed)
Pt BIB EMS. Pt was getting out of shower and fell. Pt denies LOC. Pt states he injured his L shoulder. Pt has obvious deformity to L shoulder per EMS. Pt was given Fentanyl 257mcg IVP by EMS at 2025. Pt alert, no acute distress. Skin warm and dry.

## 2013-11-14 NOTE — ED Provider Notes (Signed)
CSN: [id number]     Arrival date & time 11/14/13  2036 History   First MD Initiated Contact with Patient 11/14/13 2040     Chief Complaint  Patient presents with  . Fall  . Shoulder Injury     (Consider location/radiation/quality/duration/timing/severity/associated sxs/prior Treatment) HPI  This a 63 year old male with history of hypertension, hyperlipidemia, diabetes, and colon cancer who presents following a fall. He reports left shoulder pain after slipping and falling in the bathtub onto his left side. He denies hitting his head or loss of consciousness.  He reports a mechanical fall. He denies any syncope. Denies pain anywhere else. Rates pain at 10 out of 10 over his left shoulder. No weakness or numbness of the left upper extremity.  Past Medical History  Diagnosis Date  . Hyperlipidemia   . Hypertension     for a few years and resolved in 2013/2014.  Marland Kitchen Heart murmur   . DM II (diabetes mellitus, type II), controlled     type 2 IDDM x 15 years. A1C 1/09 13.7.   Marland Kitchen Sleep apnea     does not wear CPAP now  . 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 Arthritis     left knee  . 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 12/25/2011  . Chronic kidney disease     acute renal failure/injury requiring short-term HD 2013  . 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 Colon cancer    Past Surgical History  Procedure Laterality Date  . Colonoscopy    . Ganglion cyst excision  20 YRS AGO    RT ARM  . Partial colectomy  02/06/2012  . Partial colectomy  02/06/2012    Procedure: PARTIAL COLECTOMY;  Surgeon: Imogene Burn. [name], MD;  Location: [facility];  Service: General;  Laterality: N/A;  right partial colectomy  . Colonoscopy  02/06/2012    Procedure: COLONOSCOPY;  Surgeon: Milus Banister, MD;  Location: [place];  Service: Endoscopy;;  . Laparotomy  02/16/2012    Procedure: EXPLORATORY LAPAROTOMY;  Surgeon: Zenovia Jarred, MD;  Location: [facility];  Service: General;   Laterality: N/A;  Exploratory Laparotomy with resection of anastomosis  . Ileostomy  02/16/2012    Procedure: ILEOSTOMY;  Surgeon: Zenovia Jarred, MD;  Location: [facility];  Service: General;  Laterality: N/A;  . Application of wound vac  02/16/2012    Procedure: APPLICATION OF WOUND VAC;  Surgeon: Zenovia Jarred, MD;  Location: [place];  Service: General;  Laterality: N/A;  . Laparotomy  02/18/2012    Procedure: EXPLORATORY LAPAROTOMY;  Surgeon: Odis Hollingshead, MD;  Location: [place] [facility];  Service: General;  Laterality: N/A;  exploratory laparotomy  change of abdominal vac dressing  . Dressing change under anesthesia  02/18/2012    Procedure: DRESSING CHANGE UNDER ANESTHESIA;  Surgeon: Odis Hollingshead, MD;  Location: [place];  Service: General;  Laterality: N/A;  . Laparotomy  02/20/2012    Procedure: EXPLORATORY LAPAROTOMY;  Surgeon: Odis Hollingshead, MD;  Location: [place];  Service: General;  Laterality: N/A;  . Vacuum assisted closure change  02/20/2012    Procedure: ABDOMINAL VACUUM ASSISTED CLOSURE CHANGE;  Surgeon: Odis Hollingshead, MD;  Location: [place];  Service: General;;  . Laparotomy  02/23/2012    Procedure: EXPLORATORY LAPAROTOMY;  Surgeon: Joyice Faster. Cornett, MD;  Location: [place];  Service: General;  Laterality: N/A;  Irrigation and Debridement of abdominal wound with wound  vac change with partial closure  . Incision and drainage of wound  02/23/2012    Procedure: IRRIGATION AND DEBRIDEMENT WOUND;  Surgeon: Joyice Faster. Cornett, MD;  Location: [facility];  Service: General;  Laterality: N/A;  . Vacuum assisted closure change  02/23/2012    Procedure: ABDOMINAL VACUUM ASSISTED CLOSURE CHANGE;  Surgeon: Joyice Faster. Cornett, MD;  Location: [place];  Service: General;  Laterality: N/A;  . Laparotomy  02/26/2012    Procedure: EXPLORATORY LAPAROTOMY;  Surgeon: Imogene Burn. [name], MD;  Location: [place] [facility];  Service: General;  Laterality: N/A;     . Application of wound vac  02/26/2012    Procedure: APPLICATION OF WOUND  VAC;  Surgeon: Imogene Burn. [name], MD;  Location: [address];  Service: General;  Laterality: N/A;  . Colostomy reversal    . Iliostomy    . Resection small bowel / closure ileostomy  402/2014    Dr [name]  . Ileostomy closure N/A 09/02/2012    Procedure: ILEOSTOMY REVERSAL;  Surgeon: Imogene Burn. [name], MD;  Location: [facility];  Service: General;  Laterality: N/A;   Family History  Problem Relation Age of Onset  . Diabetes Father   . Heart disease Father   . Colon cancer Neg Hx   . Diabetes Sister   . Heart disease Sister   . Heart disease Mother    History  Substance Use Topics  . Smoking status: Current Every Day Smoker -- 0.50 packs/day for 30 years    Types: Cigarettes  . Smokeless tobacco: Never Used  . Alcohol Use: Yes     Comment: Liquor twice monthly.    Review of Systems  Respiratory: Negative for chest tightness and shortness of breath.   Cardiovascular: Negative for chest pain.  Musculoskeletal:       Left shoulder pain  All other systems reviewed and are negative.     Allergies  Review of patient's allergies indicates no known allergies.  Home Medications   Prior to Admission medications   Medication Sig Start Date End Date Taking? Authorizing Provider  amLODipine (NORVASC) 5 MG tablet Take 1 tablet (5 mg total) by mouth daily. 09/15/13   Carter Kitten, MD  Ascorbic Acid (VITAMIN C PO) Take by mouth.    Historical Provider, MD  insulin detemir (LEVEMIR) 100 UNIT/ML injection Inject 0.24 mLs (24 Units total) into the skin at bedtime. 09/17/13   Carter Kitten, MD  Multiple Vitamin (MULTIVITAMIN) tablet Take 1 tablet by mouth daily.    Historical Provider, MD  oxyCODONE-acetaminophen (PERCOCET/ROXICET) 5-325 MG per tablet Take 1 tablet by mouth every 6 (six) hours as needed for severe pain. 11/14/13   Merryl Hacker, MD   BP 185/93  Pulse 79  Temp(Src) 98 F (36.7 C) (Oral)  Resp 22  SpO2 100% Physical Exam  Nursing note and vitals reviewed. Constitutional: He is  oriented to person, place, and time. He appears well-developed and well-nourished.  HENT:  Head: Normocephalic and atraumatic.  Cardiovascular: Normal rate and regular rhythm.   Pulmonary/Chest: Effort normal. No respiratory distress. He has no wheezes.  Abdominal: Soft.  Multiple abdominal scars  Musculoskeletal: He exhibits no edema.  Focused examination of the left upper extremity:  There is step-off of the left shoulder with displacement of the humeral head anteriorly, the patient is neurovascularly intact distally with a 2+ radial pulse  Lymphadenopathy:    He has no cervical adenopathy.  Neurological: He is alert and oriented to person, place, and time.  Skin: Skin  is warm and dry.  Psychiatric: He has a normal mood and affect.    ED Course  Reduction of dislocation Date/Time: 11/14/2013 11:14 PM Performed by: Thayer Jew, F Authorized by: Thayer Jew, F Consent: written consent obtained. Risks and benefits: risks, benefits and alternatives were discussed Consent given by: patient Patient identity confirmed: verbally with patient and arm band Local anesthesia used: no Patient sedated: yes Sedation type: moderate (conscious) sedation Sedatives: etomidate Patient tolerance: Patient tolerated the procedure well with no immediate complications.   (including critical care time)  Procedural sedation Performed by: Thayer Jew, F Consent: Verbal consent obtained. Risks and benefits: risks, benefits and alternatives were discussed Required items: required blood products, implants, devices, and special equipment available Patient identity confirmed: arm band and provided demographic data Time out: Immediately prior to procedure a "time out" was called to verify the correct patient, procedure, equipment, support staff and site/side marked as required.  Sedation type: moderate (conscious) sedation NPO time confirmed and considedered  Sedatives:  ETOMIDATE  Physician Time at Bedside: 10 min  Vitals: Vital signs were monitored during sedation. Cardiac Monitor, pulse oximeter Patient tolerance: Patient tolerated the procedure well with no immediate complications. Comments: Pt with uneventful recovered. Returned to pre-procedural sedation baseline Labs Review Labs Reviewed - No data to display  Imaging Review Dg Shoulder Left  11/14/2013   CLINICAL DATA:  Left shoulder pain and deformity.  EXAM: LEFT SHOULDER - 2+ VIEW  COMPARISON:  None.  FINDINGS: There is anterior-inferior dislocation of the left humeral head, with an associated focal Hill-Sachs lesion. The inferior glenoid is tightly lodged at the Hill-Sachs defect, without a definite osseous Bankart lesion. Given the appearance, inferior labral injury is suspected.  No additional fractures are seen. The left acromioclavicular joint is grossly unremarkable in appearance. The visualized portions of the lungs are grossly clear. No significant soft tissue abnormalities are characterized on radiograph.  IMPRESSION: Anterior-inferior dislocation of the left humeral head, with focal Hill-Sachs lesion. The inferior glenoid is tightly lodged at the Hill-Sachs defect, without a definite osseous Bankart lesion. Given the appearance, inferior labral injury is suspected.   Electronically Signed   By: Garald Balding M.D.   On: 11/14/2013 21:21   Dg Shoulder Left Port  11/14/2013   CLINICAL DATA:  Status post reduction of left shoulder dislocation.  EXAM: PORTABLE LEFT SHOULDER - 2+ VIEW  COMPARISON:  None.  FINDINGS: There has been successful reduction of the left humeral head dislocation. A focal Hill-Sachs lesion is again seen. No osseous Bankart lesion is identified. No additional fractures are seen. The left acromioclavicular joint demonstrates minimal degenerative change.  No significant soft tissue abnormalities are identified. The visualized portions of the lungs are clear.  IMPRESSION: Successful  reduction of left humeral head dislocation. Focal Hill-Sachs lesion again seen. No osseous Bankart lesion identified at this time.   Electronically Signed   By: Garald Balding M.D.   On: 11/14/2013 22:30     EKG Interpretation None      MDM   Final diagnoses:  Shoulder dislocation    Patient presents with what appears to be an isolated left shoulder dislocation. Confirmed with imaging. Patient was sedated with etomidate and shoulder was reduced without complication.  No other obvious injury. Patient will be placed in a sling and referred to orthopedics.  He will be given pain medication at discharge.  After history, exam, and medical workup I feel the patient has been appropriately medically screened and is safe for discharge  home. Pertinent diagnoses were discussed with the patient. Patient was given return precautions.     Merryl Hacker, MD 11/14/13 [phone]

## 2013-11-14 NOTE — Sedation Documentation (Signed)
XR Left shoulder done.

## 2013-11-14 NOTE — Discharge Instructions (Signed)
Shoulder Dislocation  Your shoulder is made up of three bones: the collar bone (clavicle); the shoulder blade (scapula), which includes the socket (glenoid cavity); and the upper arm bone (humerus). Your shoulder joint is the place where these bones meet. Strong, fibrous tissues hold these bones together (ligaments). Muscles and strong, fibrous tissues that connect the muscles to these bones (tendons) allow your arm to move through this joint. The range of motion of your shoulder joint is more extensive than most of your other joints, and the glenoid cavity is very shallow. That is the reason that your shoulder joint is one of the most unstable joints in your body. It is far more prone to dislocation than your other joints. Shoulder dislocation is when your humerus is forced out of your shoulder joint.  CAUSES  Shoulder dislocation is caused by a forceful impact on your shoulder. This impact usually is from an injury, such as a sports injury or a fall.  SYMPTOMS  Symptoms of shoulder dislocation include:   Deformity of your shoulder.   Intense pain.   Inability to move your shoulder joint.   Numbness, weakness, or tingling around your shoulder joint (your neck or down your arm).   Bruising or swelling around your shoulder.  DIAGNOSIS  In order to diagnose a dislocated shoulder, your caregiver will perform a physical exam. Your caregiver also may have an X-ray exam done to see if you have any broken bones. Magnetic resonance imaging (MRI) is a procedure that sometimes is done to help your caregiver see any damage to the soft tissues around your shoulder, particularly your rotator cuff tendons. Additionally, your caregiver also may have electromyography done to measure the electrical discharges produced in your muscles if you have signs or symptoms of nerve damage.  TREATMENT  A shoulder dislocation is treated by placing the humerus back in the joint (reduction). Your caregiver does this either manually (closed  reduction), by moving your humerus back into the joint through manipulation, or through surgery (open reduction). When your humerus is back in place, severe pain should improve almost immediately.  You also may need to have surgery if you have a weak shoulder joint or ligaments, and you have recurring shoulder dislocations, despite rehabilitation. In rare cases, surgery is necessary if your nerves or blood vessels are damaged during the dislocation.  After your reduction, your arm will be placed in a shoulder immobilizer or sling to keep it from moving. Your caregiver will have you wear your shoulder immoblizer or sling for 3 days to 3 weeks, depending on how serious your dislocation is. When your shoulder immobilizer or sling is removed, your caregiver may prescribe physical therapy to help improve the range of motion in your shoulder joint.  HOME CARE INSTRUCTIONS   The following measures can help to reduce pain and speed up the healing process:   Rest your injured joint. Do not move it. Avoid activities similar to the one that caused your injury.   Apply ice to your injured joint for the first day or two after your reduction or as directed by your caregiver. Applying ice helps to reduce inflammation and pain.   Put ice in a plastic bag.   Place a towel between your skin and the bag.   Leave the ice on for 15-20 minutes at a time, every 2 hours while you are awake.   Exercise your hand by squeezing a soft ball. This helps to eliminate stiffness and swelling in your hand and   wrist.   Take over-the-counter or prescription medicine for pain or discomfort as told by your caregiver.  SEEK IMMEDIATE MEDICAL CARE IF:    Your shoulder immobilizer or sling becomes damaged.   Your pain becomes worse rather than better.   You lose feeling in your arm or hand, or they become white and cold.  MAKE SURE YOU:    Understand these instructions.   Will watch your condition.   Will get help right away if you are not  doing well or get worse.  Document Released: 02/12/2001 Document Revised: 08/12/2011 Document Reviewed: 03/10/2011  [employer] Patient Information 2014 [employer]

## 2013-11-14 NOTE — ED Notes (Signed)
Patient transported to X-ray 

## 2013-11-14 NOTE — ED Notes (Addendum)
Patient daughter at bedside, she sent her two minor children to the lobby to sit alone. Patient daughter was advised the children should not be the lobby unsupervised. Patient then demanded her sister to come back, she became loud and stated "you aren't gonna talk over me". I then attempted to explain that because the patient was about to undergo procedure that family would not be able to remain in the room during sedation. Patient daughter stated "I'm staying in here while they do all this." Updated primary nurse of conversation that had occurred, recommended primary nurse to remind them just prior to start of procedure that they will have to wait in the lobby and to update with any additional issues.

## 2013-11-14 NOTE — ED Notes (Addendum)
Pt was informed by RN to keep sling on arm.   Pt disregarded RN's advice and removed arm sling.

## 2013-11-14 NOTE — ED Notes (Signed)
Discharge complete, waiting for pt's family to come get him per pt request. Pt using room phone to contact family.

## 2013-11-15 ENCOUNTER — Ambulatory Visit: Payer: Self-pay | Admitting: Nurse Practitioner

## 2013-11-15 ENCOUNTER — Other Ambulatory Visit: Payer: Self-pay

## 2013-12-02 ENCOUNTER — Other Ambulatory Visit: Payer: Self-pay | Admitting: *Deleted

## 2013-12-02 NOTE — Progress Notes (Signed)
[facility] for lab/6 month OV on 11/15/13. POF to scheduler to see in 1-2 months.

## 2013-12-03 ENCOUNTER — Telehealth: Payer: Self-pay | Admitting: Oncology

## 2013-12-03 NOTE — Telephone Encounter (Signed)
Talked to pt and gave him appt for 7/6 lab and ML

## 2013-12-06 ENCOUNTER — Ambulatory Visit (HOSPITAL_BASED_OUTPATIENT_CLINIC_OR_DEPARTMENT_OTHER): Payer: Medicaid Other | Admitting: Nurse Practitioner

## 2013-12-06 ENCOUNTER — Other Ambulatory Visit (HOSPITAL_BASED_OUTPATIENT_CLINIC_OR_DEPARTMENT_OTHER): Payer: Medicaid Other

## 2013-12-06 ENCOUNTER — Other Ambulatory Visit: Payer: Self-pay | Admitting: Nurse Practitioner

## 2013-12-06 ENCOUNTER — Telehealth: Payer: Self-pay | Admitting: Oncology

## 2013-12-06 VITALS — BP 161/90 | HR 67 | Temp 97.1°F | Resp 19 | Ht 71.0 in | Wt 229.5 lb

## 2013-12-06 DIAGNOSIS — D649 Anemia, unspecified: Secondary | ICD-10-CM

## 2013-12-06 DIAGNOSIS — C184 Malignant neoplasm of transverse colon: Secondary | ICD-10-CM

## 2013-12-06 DIAGNOSIS — N189 Chronic kidney disease, unspecified: Secondary | ICD-10-CM

## 2013-12-06 DIAGNOSIS — D539 Nutritional anemia, unspecified: Secondary | ICD-10-CM

## 2013-12-06 DIAGNOSIS — C189 Malignant neoplasm of colon, unspecified: Secondary | ICD-10-CM

## 2013-12-06 DIAGNOSIS — R718 Other abnormality of red blood cells: Secondary | ICD-10-CM

## 2013-12-06 LAB — CBC WITH DIFFERENTIAL/PLATELET
BASO%: 0.5 % (ref 0.0–2.0)
Basophils Absolute: 0 10*3/uL (ref 0.0–0.1)
EOS%: 3.2 % (ref 0.0–7.0)
Eosinophils Absolute: 0.2 10*3/uL (ref 0.0–0.5)
HCT: 32.8 % — ABNORMAL LOW (ref 38.4–49.9)
HGB: 11.1 g/dL — ABNORMAL LOW (ref 13.0–17.1)
LYMPH%: 38.2 % (ref 14.0–49.0)
MCH: 23.8 pg — ABNORMAL LOW (ref 27.2–33.4)
MCHC: 33.8 g/dL (ref 32.0–36.0)
MCV: 70.4 fL — ABNORMAL LOW (ref 79.3–98.0)
MONO#: 0.5 10*3/uL (ref 0.1–0.9)
MONO%: 6.9 % (ref 0.0–14.0)
NEUT#: 3.3 10*3/uL (ref 1.5–6.5)
NEUT%: 51.2 % (ref 39.0–75.0)
Platelets: 230 10*3/uL (ref 140–400)
RBC: 4.66 10*6/uL (ref 4.20–5.82)
RDW: 17.5 % — ABNORMAL HIGH (ref 11.0–14.6)
WBC: 6.5 10*3/uL (ref 4.0–10.3)
lymph#: 2.5 10*3/uL (ref 0.9–3.3)

## 2013-12-06 LAB — FERRITIN CHCC: Ferritin: 23 ng/ml (ref 22–316)

## 2013-12-06 NOTE — Progress Notes (Addendum)
  [facility] OFFICE PROGRESS NOTE   Diagnosis:  Colon cancer.  INTERVAL HISTORY:   [name] returns as scheduled. Bowels moving regularly. No bloody or black stools. He denies abdominal pain. He reports a good appetite. No shortness of breath or cough.  He reports he dislocated his left shoulder several weeks ago following a fall. He was seen in the emergency Department. Under sedation the shoulder was reduced.  He reports he has been off of his blood pressure medication and insulin for the past several months due to insurance issues.  Objective:  Vital signs in last 24 hours:  Blood pressure 161/90, pulse 67, temperature 97.1 F (36.2 C), temperature source Oral, resp. rate 19, height _0  (1.803 m), weight 229 lb 8 oz (104.101 kg).    HEENT: No thrush or ulcerations. Lymphatics: No palpable cervical, supraclavicular, axillary or inguinal lymph nodes. Prominent bilateral axillary fat pads. Resp: Lungs clear. Breath sounds are distant. Cardio: Regular cardiac rhythm. GI: Abdomen soft and nontender. No mass. No organomegaly. Vascular: No leg edema. Skin: Healed zoster rash at the right upper back.    Lab Results:  Lab Results  Component Value Date   WBC 6.5 12/06/2013   HGB 11.1* 12/06/2013   HCT 32.8* 12/06/2013   MCV 70.4* 12/06/2013   PLT 230 12/06/2013   NEUTROABS 3.3 12/06/2013    Imaging:  No results found.  Medications: I have reviewed the patient's current medications.  Assessment/Plan: 1. Adenocarcinoma of the transverse colon, stage II (T3 N0), status post a right hemicolectomy on 02/06/2012. The tumor was microsatellite stable.  2. Postoperative anastomotic leak/septic shock, status post resection of the anastomosis and creation of an ileostomy, status post ileostomy takedown April 2014.  3. History of repeat admissions with dehydration/acute renal failure secondary to a high output ileostomy.  4. Diabetes.  5. Microcytic anemia August  2013-predating surgery; the red cell microcytosis persisted despite taking iron. He has chronic red cell microcytosis, ? Thalassemia variant. Ferritin level in normal range at 48 on 05/17/2013. The mild anemia could also be in part related to chronic renal insufficiency.  6. Zoster rash right chest in November 2014.  7. Hypertension.  8. Surveillance colonoscopy 08/16/2013. Normal colon. Next colonoscopy recommended in 3 years.   Disposition: [name] remains in clinical remission from colon cancer. We will followup on the CEA from today.  He has a persistent red cell microcytosis and mild anemia. The ferritin level was in normal range December 2014. We will obtain another ferritin today and review the peripheral blood smear. He may have a thalassemia variant. The anemia may be related to chronic renal insufficiency.  He will return for a followup visit and CEA in 6 months. He will contact the office in the interim with any problems. He was encouraged to contact the internal medicine clinic for assistance with his medications.   Plan reviewed with [name].    [name] ANP/GNP-BC   12/06/2013  11:55 AM    Addendum 12/17/2013  Review of peripheral blood smear by [name] blood cells microcytic, hypochromic, moderate variation in size; platelets normal; white blood cells normal.

## 2013-12-06 NOTE — Telephone Encounter (Signed)
Gave ptm appt for lab and MD for january 2016

## 2013-12-07 ENCOUNTER — Encounter: Payer: Self-pay | Admitting: Licensed Clinical Social Worker

## 2013-12-07 ENCOUNTER — Ambulatory Visit: Payer: Medicaid Other | Admitting: Internal Medicine

## 2013-12-07 ENCOUNTER — Telehealth: Payer: Self-pay

## 2013-12-07 ENCOUNTER — Other Ambulatory Visit: Payer: Self-pay | Admitting: *Deleted

## 2013-12-07 DIAGNOSIS — I10 Essential (primary) hypertension: Secondary | ICD-10-CM

## 2013-12-07 DIAGNOSIS — D539 Nutritional anemia, unspecified: Secondary | ICD-10-CM

## 2013-12-07 LAB — CEA: CEA: 2.6 ng/mL (ref 0.0–5.0)

## 2013-12-07 MED ORDER — FERROUS SULFATE 325 (65 FE) MG PO TBEC: 325.0000 mg | DELAYED_RELEASE_TABLET | Freq: Two times a day (BID) | ORAL | Status: DC

## 2013-12-07 NOTE — Telephone Encounter (Signed)
Pt to see Dr Eyvonne Mechanic 2:15PM 12/07/13 in clinic. Dr Eyvonne Mechanic will rewrite Rx since pt needs hard copies to go to three different pharmacies - due to no insurance. Pt lost Medicaid.

## 2013-12-07 NOTE — Telephone Encounter (Signed)
Message copied by Bevelyn Ngo on Tue Dec 07, 2013  9:01 AM ------      Message from: Wardell Heath      Created: Tue Dec 07, 2013  8:47 AM                   ----- Message -----         From: Ladell Pier, MD         Sent: 12/06/2013   6:22 PM           To: Tania Ade, RN, Ludwig Lean, RN, #            Please call patient, iron is low , start ferrous sulfate 325mg  bid, check cbc 72month            Lattie Haw may have already addressed this ------

## 2013-12-07 NOTE — Telephone Encounter (Signed)
Talked with Dr Marinda Elk - Pt needs an appt - since Percocet were given at [facility] ER. Pt aware and Dr Eyvonne Mechanic will see pt 12/07/13 2:15PM. Dr Eyvonne Mechanic aware of info on meds.

## 2013-12-07 NOTE — Telephone Encounter (Signed)
Notified patient of normal CEA result and low ferritin result. Instructed him to start OTC ferrous sulfate twice daily. Will recheck CBC on 01/26/14 at 10 am.

## 2013-12-07 NOTE — Telephone Encounter (Signed)
Message copied by Tania Ade on Tue Dec 07, 2013 12:08 PM ------      Message from: Betsy Coder B      Created: Mon Dec 06, 2013  6:22 PM       Please call patient, iron is low , start ferrous sulfate 325mg  bid, check cbc 34month            Lattie Haw may have already addressed this ------

## 2013-12-07 NOTE — Telephone Encounter (Signed)
The Percocet was prescribed last month by the ED physician for an acute shoulder injury.  If a refill is needed, he needs to be assessed.  Would offer an afternoon appointment today.

## 2013-12-08 ENCOUNTER — Encounter: Payer: Self-pay | Admitting: Internal Medicine

## 2013-12-08 ENCOUNTER — Ambulatory Visit (INDEPENDENT_AMBULATORY_CARE_PROVIDER_SITE_OTHER): Payer: Self-pay | Admitting: Internal Medicine

## 2013-12-08 VITALS — BP 173/89 | HR 69 | Temp 97.1°F | Resp 20 | Ht 69.0 in | Wt 231.7 lb

## 2013-12-08 DIAGNOSIS — E1165 Type 2 diabetes mellitus with hyperglycemia: Secondary | ICD-10-CM

## 2013-12-08 DIAGNOSIS — IMO0001 Reserved for inherently not codable concepts without codable children: Secondary | ICD-10-CM

## 2013-12-08 DIAGNOSIS — E119 Type 2 diabetes mellitus without complications: Secondary | ICD-10-CM

## 2013-12-08 DIAGNOSIS — IMO0002 Reserved for concepts with insufficient information to code with codable children: Secondary | ICD-10-CM

## 2013-12-08 DIAGNOSIS — E118 Type 2 diabetes mellitus with unspecified complications: Secondary | ICD-10-CM

## 2013-12-08 DIAGNOSIS — I10 Essential (primary) hypertension: Secondary | ICD-10-CM

## 2013-12-08 LAB — GLUCOSE, CAPILLARY: Glucose-Capillary: 250 mg/dL — ABNORMAL HIGH (ref 70–99)

## 2013-12-08 LAB — POCT GLYCOSYLATED HEMOGLOBIN (HGB A1C): Hemoglobin A1C: 9.6

## 2013-12-08 MED ORDER — INSULIN DETEMIR 100 UNIT/ML ~~LOC~~ SOLN: 24.0000 [IU] | Freq: Every day | SUBCUTANEOUS | Status: DC

## 2013-12-08 MED ORDER — AMLODIPINE BESYLATE 5 MG PO TABS: 5.0000 mg | ORAL_TABLET | Freq: Every day | ORAL | Status: DC

## 2013-12-08 NOTE — Progress Notes (Signed)
Mr. Tucciarone was referred to [place] as pt Medicaid has been denied/pt has a $7000 deductible.  Pt states he has been unable to afford is medication and without for the last month.  CSW encourged Mr. Newmyer to apply for the [place] and [place] program to assist with medications.  CSW discussed the $4 pharmacy discount programs and the GoodRx program.  Mr. Guilbault is familiar with both programs as he was on the [facility] card in prior years.  Pt does receive disability benefits.  Pt able to afford medications from $4 listing and utilizing GoodRx for others.  CSW assisted Mr. Shangraw with signing up for the [place] and printed out for pt to receive Levemir for $25.  Pt states he can not afford strip as this time, current meter strip financially out of reach.  CSW provided information from [facility] on Relion Meter and strips which would be more affordable.  Pt aware CSW is available to assist as needed.  CSW will sign off.

## 2013-12-08 NOTE — Assessment & Plan Note (Signed)
BP elevated due to non-compliance to his norvasc.  Plans Information provided where he could get Norvasc 5 mg, one month supply for 5 dollars a month. Patient stated that he has the money to get it refilled.

## 2013-12-08 NOTE — Progress Notes (Signed)
Subjective:   Patient ID: [name]. male   DOB: Sep 01, 1950 63 y.o.   MRN: [id number]  HPI: [name] is a 63 y.o. gentleman with PMH significant for HTN, History of DM, H/O Colon cancer s/p right hemicolectomy Comes to the office for help with his medications.  Patient reports that he does not have any insurance currently and has not been able to afford any of his medications. He reports that he hasn't taken any of his medications for the last one month, including his insulin. He reports that he is not checking his blood sugars also. 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   Patient denies any other complaints today.   Past Medical History  Diagnosis Date  . Hyperlipidemia   . Hypertension     for a few years and resolved in 2013/2014.  Marland Kitchen Heart murmur   . DM II (diabetes mellitus, type II), controlled     type 2 IDDM x 15 years. A1C 1/09 13.7.   Marland Kitchen Sleep apnea     does not wear CPAP now  . 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 Arthritis     left knee  . 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 12/25/2011  . Chronic kidney disease     acute renal failure/injury requiring short-term HD 2013  . 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 Colon cancer    Current Outpatient Prescriptions  Medication Sig Dispense Refill  . amLODipine (NORVASC) 5 MG tablet Take 1 tablet (5 mg total) by mouth daily.  30 tablet  11  . Ascorbic Acid (VITAMIN C PO) Take by mouth.      . ferrous sulfate 325 (65 FE) MG EC tablet Take 1 tablet (325 mg total) by mouth 2 (two) times daily with a meal.    3  . insulin detemir (LEVEMIR) 100 UNIT/ML injection Inject 0.24 mLs (24 Units total) into the skin at bedtime.  10 mL  11  . Multiple Vitamin (MULTIVITAMIN) tablet Take 1 tablet by mouth daily.       No current facility-administered medications for this visit.   Family History  Problem Relation  Age of Onset  . Diabetes Father   . Heart disease Father   . Colon cancer Neg Hx   . Diabetes Sister   . Heart disease Sister   . Heart disease Mother    History   Social History  . Marital Status: Divorced    Spouse Name: N/A    Number of Children: 3  . Years of Education: N/A   Occupational History  .      Worked at [employer] Topics  . Smoking status: Current Every Day Smoker -- 0.50 packs/day for 30 years    Types: Cigarettes  . Smokeless tobacco: Never Used  . Alcohol Use: Yes     Comment: Liquor twice monthly.  . Drug Use: No  . Sexual Activity: Not on file   Other Topics Concern  . Not on file   Social History Narrative   Financial assistance approved for 100% discount at [facility] and has [facility] card   [employer]  August 08, 2009 5:48 PM   *PATIENT WAS GIVEN DM CARD.Lela Sturdivant NT II  June 08, 2010 3:56 PM      Divorced, one daughter currently staying with  him   Has 7 grand children   Reports disability and medicaid is pending   Review of Systems: Pertinent items are noted in HPI. Objective:  Physical Exam: Filed Vitals:   12/08/13 0924  BP: 173/89  Pulse: 69  Temp: 97.1 F (36.2 C)  TempSrc: Oral  Resp: 20  Height: 5\' 9"  (1.753 m)  Weight: 231 lb 11.2 oz (105.098 kg)  SpO2: 99%   Constitutional: Vital signs reviewed.  Patient is a well-developed and well-nourished and is in no acute distress.  Patient is cooperative with exam.  Alert and oriented x3.  Cardiovascular: RRR, S1 normal, S2 normal, no MRG, pulses symmetric and intact bilaterally  Pulmonary/Chest: normal respiratory effort, CTAB, no wheezes, rales, or rhonchi  No pedal edema noted.  Assessment & Plan:

## 2013-12-08 NOTE — Assessment & Plan Note (Addendum)
A1C improved to 9.6 today. Non-compliant to his insulin regimen over the last one month, as he does not have insurance.  Plans: Social worker signed him for Levemir program where he can get a month supply for 25 dollars in any pharmacy. All the information has been provided to the patient. Recommended to contact the clinic incase if he has any difficiculty. [name] Plyler provided information about Relion glucometer at [employer] which he could buy for under 30 dollars. Provided a free clinic sample of Levemir 100 U/ML, 10 ml vial from the clinic that should last about a 40 days or so. Recommended to follow up in the clinic in 2 weeks to make adjustments to his insulin.

## 2013-12-08 NOTE — Patient Instructions (Signed)
Start using the Insulin as recommended. Use the Norvasc and Iron pills as recommended.

## 2013-12-09 LAB — LIPID PANEL
Cholesterol: 133 mg/dL (ref 0–200)
HDL: 42 mg/dL (ref >39)
LDL Cholesterol: 51 mg/dL (ref 0–99)
Total CHOL/HDL Ratio: 3.2 Ratio
Triglycerides: 202 mg/dL — ABNORMAL HIGH (ref <150)
VLDL: 40 mg/dL (ref 0–40)

## 2013-12-09 LAB — MICROALBUMIN / CREATININE URINE RATIO
Creatinine, Urine: 357.7 mg/dL
Microalb Creat Ratio: 714.2 mg/g — ABNORMAL HIGH (ref 0.0–30.0)
Microalb, Ur: 255.48 mg/dL — ABNORMAL HIGH (ref 0.00–1.89)

## 2013-12-09 NOTE — Progress Notes (Signed)
Case discussed with Dr. Boggala at the time of the visit.  We reviewed the resident's history and exam and pertinent patient test results.  I agree with the assessment, diagnosis, and plan of care documented in the resident's note. 

## 2013-12-17 ENCOUNTER — Other Ambulatory Visit: Payer: Self-pay | Admitting: Nurse Practitioner

## 2014-01-26 ENCOUNTER — Other Ambulatory Visit: Payer: Medicaid Other

## 2014-03-28 ENCOUNTER — Encounter: Payer: Self-pay | Admitting: Internal Medicine

## 2014-03-28 ENCOUNTER — Ambulatory Visit: Payer: Self-pay | Admitting: Internal Medicine

## 2014-05-24 ENCOUNTER — Telehealth: Payer: Self-pay | Admitting: Oncology

## 2014-05-24 NOTE — Telephone Encounter (Signed)
due to call day moved 1/7 appt to 1/22. s/w pt he is aware and per pt request schedule mailed.

## 2014-06-09 ENCOUNTER — Other Ambulatory Visit: Payer: Medicaid Other

## 2014-06-09 ENCOUNTER — Ambulatory Visit: Payer: Medicaid Other | Admitting: Oncology

## 2014-06-24 ENCOUNTER — Telehealth: Payer: Self-pay | Admitting: Oncology

## 2014-06-24 ENCOUNTER — Other Ambulatory Visit: Payer: Self-pay

## 2014-06-24 ENCOUNTER — Ambulatory Visit: Payer: Self-pay | Admitting: Oncology

## 2014-06-24 ENCOUNTER — Other Ambulatory Visit: Payer: Self-pay | Admitting: Oncology

## 2014-06-24 NOTE — Telephone Encounter (Signed)
lvm for pt regarding to todays appt moved to Feb...mailed pt appt sched and letter

## 2014-07-08 ENCOUNTER — Ambulatory Visit: Payer: Self-pay | Admitting: Nurse Practitioner

## 2014-07-08 ENCOUNTER — Other Ambulatory Visit: Payer: Self-pay

## 2014-07-11 ENCOUNTER — Telehealth: Payer: Self-pay

## 2014-07-11 ENCOUNTER — Telehealth: Payer: Self-pay | Admitting: Nurse Practitioner

## 2014-07-11 NOTE — Telephone Encounter (Signed)
S/w pt to r/s but pt states he has no Medicaid sent msg to TH/MD and advising pt to call back once he gets insurance.... KJ

## 2014-07-11 NOTE — Telephone Encounter (Signed)
Called and spoke with pt regarding missed appt 07/08/14. States he tried calling but it was too late. Informed him scheduling dept would be contacting him to reschedule appt. Pt verbalized understanding. POF sent to scheduling.

## 2014-07-12 ENCOUNTER — Encounter: Payer: Self-pay | Admitting: Oncology

## 2014-07-12 ENCOUNTER — Telehealth: Payer: Self-pay | Admitting: *Deleted

## 2014-07-12 ENCOUNTER — Telehealth: Payer: Self-pay | Admitting: Oncology

## 2014-07-12 NOTE — Telephone Encounter (Signed)
S/w pt confirming labs/ov per 02/09 POF, pt req mailed sch he was driving, mailed sch to pt... KJ

## 2014-07-12 NOTE — Telephone Encounter (Signed)
Informed pt that our office financial counselor will call to assist him with his insurance questions. Order sent to schedulers for Lab/office visit in 1-2 mos.

## 2014-07-12 NOTE — Progress Notes (Unsigned)
Will need insurance info if patient is to have future visits. No visits scheduled as of today.

## 2014-07-12 NOTE — Telephone Encounter (Signed)
-----   Message from Ladell Pier, MD sent at 07/12/2014  8:02 AM EST ----- Regarding: FW: Pt cancelled no Medicaid Needs f/u of anemia.  Schedule cbc, ferritin, cea as soon as he will come in, office sherrill or lisa next 1-2 months ----- Message -----    From: Selena Lesser    Sent: 07/11/2014  10:57 AM      To: Shirlee Latch, NP, # Subject: Pt cancelled no Medicaid                       S/w pt this morning to r/s from 02/05 pt states he doesn't have Medicaid any more but that he is a [place], I put pt through VM for [place] due to I wasn't for sure where to go from here. I did advise him until he hears from [place] that he would need to call us once he gets his Medicaid activated again.....  Thanks  KIM

## 2014-08-23 ENCOUNTER — Other Ambulatory Visit: Payer: Self-pay

## 2014-08-23 ENCOUNTER — Ambulatory Visit: Payer: Self-pay | Admitting: Nurse Practitioner

## 2014-09-09 ENCOUNTER — Encounter: Payer: Self-pay | Admitting: Internal Medicine

## 2014-11-15 ENCOUNTER — Encounter: Payer: Self-pay | Admitting: Gastroenterology

## 2014-12-22 NOTE — Telephone Encounter (Signed)
error 

## 2015-05-24 ENCOUNTER — Encounter (HOSPITAL_COMMUNITY): Payer: Self-pay

## 2015-05-24 ENCOUNTER — Emergency Department (HOSPITAL_COMMUNITY)
Admission: EM | Admit: 2015-05-24 | Discharge: 2015-05-24 | Disposition: A | Discharge status: Home / Self Care | Payer: Medicare Other | Attending: Emergency Medicine | Admitting: Emergency Medicine

## 2015-05-24 ENCOUNTER — Emergency Department (HOSPITAL_COMMUNITY): Payer: Medicare Other

## 2015-05-24 DIAGNOSIS — E119 Type 2 diabetes mellitus without complications: Secondary | ICD-10-CM | POA: Diagnosis not present

## 2015-05-24 DIAGNOSIS — W1839XA Other fall on same level, initial encounter: Secondary | ICD-10-CM | POA: Diagnosis not present

## 2015-05-24 DIAGNOSIS — Y9389 Activity, other specified: Secondary | ICD-10-CM | POA: Insufficient documentation

## 2015-05-24 DIAGNOSIS — M1990 Unspecified osteoarthritis, unspecified site: Secondary | ICD-10-CM | POA: Diagnosis not present

## 2015-05-24 DIAGNOSIS — S79911A Unspecified injury of right hip, initial encounter: Secondary | ICD-10-CM | POA: Insufficient documentation

## 2015-05-24 DIAGNOSIS — N189 Chronic kidney disease, unspecified: Secondary | ICD-10-CM | POA: Insufficient documentation

## 2015-05-24 DIAGNOSIS — S76111A Strain of right quadriceps muscle, fascia and tendon, initial encounter: Secondary | ICD-10-CM

## 2015-05-24 DIAGNOSIS — R011 Cardiac murmur, unspecified: Secondary | ICD-10-CM | POA: Insufficient documentation

## 2015-05-24 DIAGNOSIS — Y998 Other external cause status: Secondary | ICD-10-CM | POA: Insufficient documentation

## 2015-05-24 DIAGNOSIS — E785 Hyperlipidemia, unspecified: Secondary | ICD-10-CM | POA: Diagnosis not present

## 2015-05-24 DIAGNOSIS — Z794 Long term (current) use of insulin: Secondary | ICD-10-CM | POA: Diagnosis not present

## 2015-05-24 DIAGNOSIS — Z85038 Personal history of other malignant neoplasm of large intestine: Secondary | ICD-10-CM | POA: Diagnosis not present

## 2015-05-24 DIAGNOSIS — Z79899 Other long term (current) drug therapy: Secondary | ICD-10-CM | POA: Insufficient documentation

## 2015-05-24 DIAGNOSIS — I129 Hypertensive chronic kidney disease with stage 1 through stage 4 chronic kidney disease, or unspecified chronic kidney disease: Secondary | ICD-10-CM | POA: Insufficient documentation

## 2015-05-24 DIAGNOSIS — Z86010 Personal history of colonic polyps: Secondary | ICD-10-CM | POA: Insufficient documentation

## 2015-05-24 DIAGNOSIS — Y9289 Other specified places as the place of occurrence of the external cause: Secondary | ICD-10-CM | POA: Insufficient documentation

## 2015-05-24 DIAGNOSIS — F17210 Nicotine dependence, cigarettes, uncomplicated: Secondary | ICD-10-CM | POA: Insufficient documentation

## 2015-05-24 DIAGNOSIS — M25551 Pain in right hip: Secondary | ICD-10-CM

## 2015-05-24 LAB — CBG MONITORING, ED: Glucose-Capillary: 144 mg/dL — ABNORMAL HIGH (ref 65–99)

## 2015-05-24 IMAGING — DX DG HIP (WITH OR WITHOUT PELVIS) 2-3V*R*
3 series · 3 of 3 positions shown · non-contrast
Comparison: None.

CLINICAL DATA: Pain following fall 2 days prior

EXAM:
DG HIP (WITH OR WITHOUT PELVIS) 2-3V RIGHT

[pelvis ap]
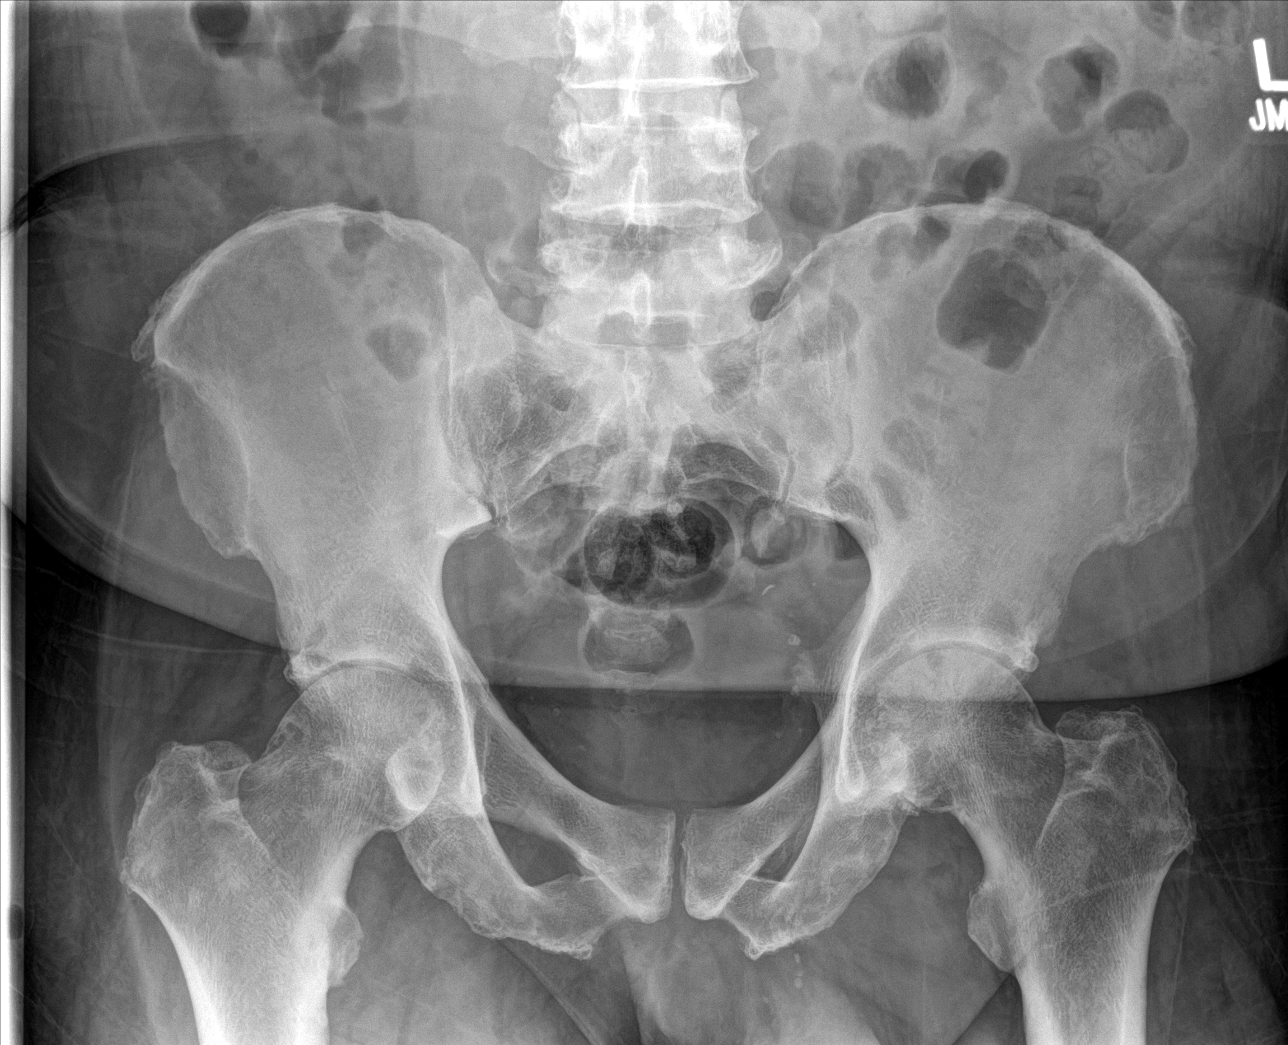

[hip ap]
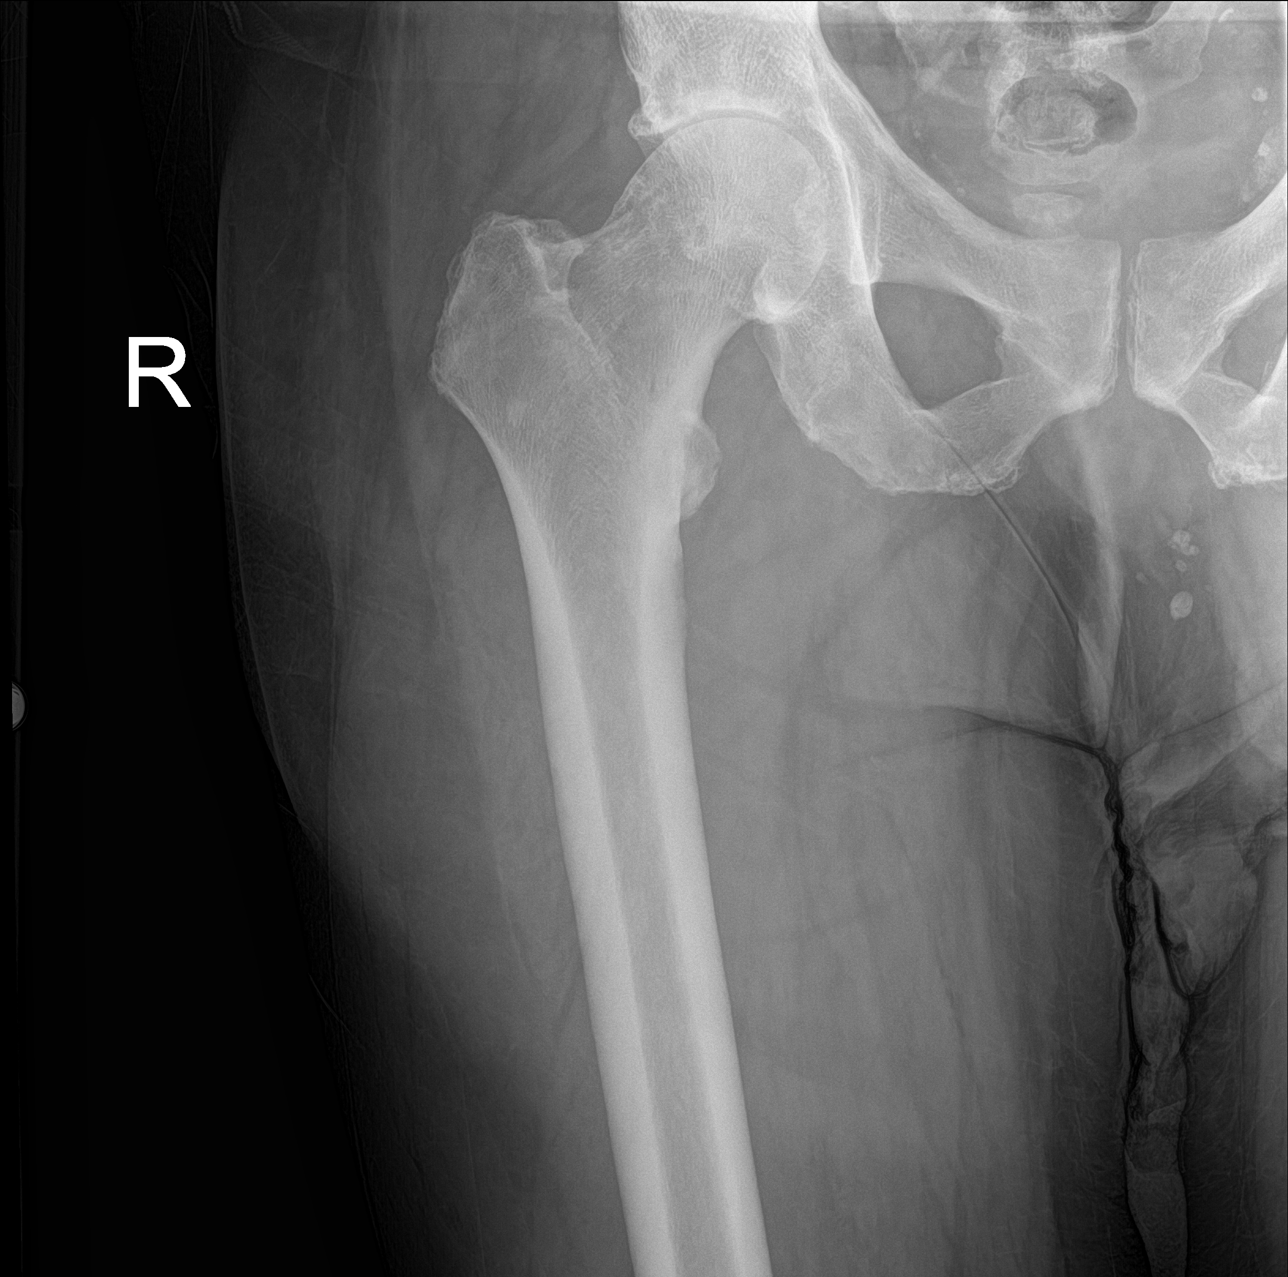

[hip lat]
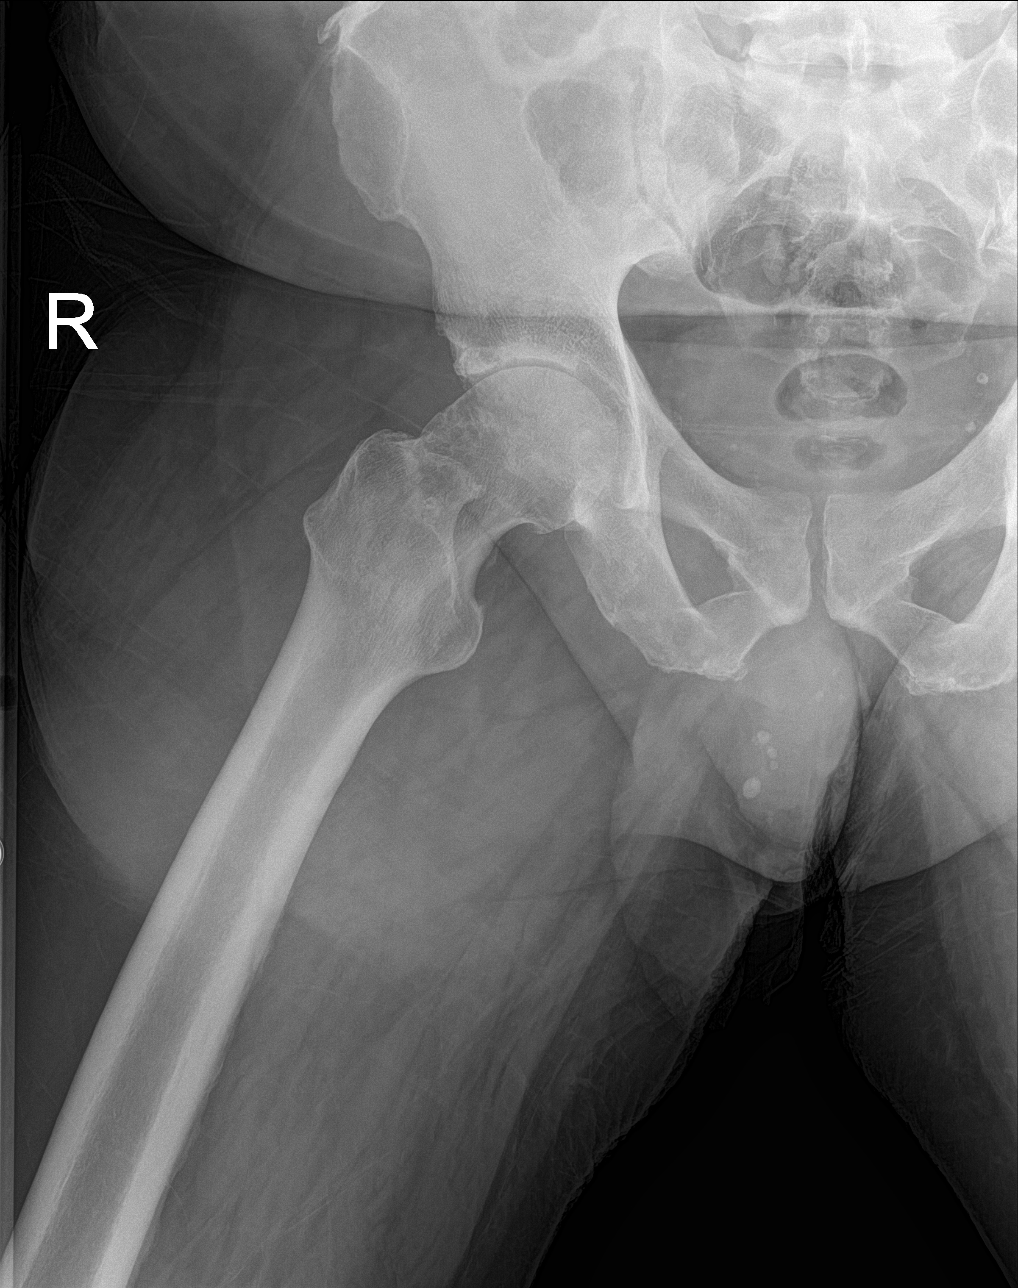

[3 of 3 positions shown; findings below may reference images not displayed]

FINDINGS: Frontal pelvis as well as frontal and lateral right hip images were
obtained. There is no fracture or dislocation. There is moderate hip
joint space narrowing bilaterally, symmetric. There is subchondral
cystic change bilaterally. No erosive change.
IMPRESSION: Moderate symmetric osteoarthritic change in both hip joints. No
acute fracture or dislocation.

## 2015-05-24 MED ORDER — DIAZEPAM 5 MG PO TABS: 5.0000 mg | ORAL_TABLET | Freq: Three times a day (TID) | ORAL | Status: DC | PRN

## 2015-05-24 MED ORDER — HYDROCODONE-ACETAMINOPHEN 5-325 MG PO TABS
1.0000 | ORAL_TABLET | Freq: Once | ORAL | Status: DC
  Filled 2015-05-24: qty 1

## 2015-05-24 MED ORDER — DIAZEPAM 5 MG/ML IJ SOLN
5.0000 mg | Freq: Once | INTRAMUSCULAR | Status: AC
  Administered 2015-05-24: 5 mg via INTRAMUSCULAR
  Filled 2015-05-24: qty 2

## 2015-05-24 NOTE — ED Notes (Signed)
MD at bedside. 

## 2015-05-24 NOTE — ED Notes (Signed)
Pt presents with 2 day h/o R thigh pain.  Pt reports walking up a hill, tripped and fell into a tree stump.  Pt reports pain to R thigh, reports difficulty walking but is able to bear weight.

## 2015-05-24 NOTE — ED Notes (Signed)
Patient transported to X-ray 

## 2015-05-24 NOTE — ED Provider Notes (Signed)
CSN: [id number]     Arrival date & time 05/24/15  0700 History   First MD Initiated Contact with Patient 05/24/15 [phone]     Chief Complaint  Patient presents with  . Fall     (Consider location/radiation/quality/duration/timing/severity/associated sxs/prior Treatment) HPI Comments: Now 3-4/10 if stand 8-9/10 Tripped over tree stump and right side landed on root Now right hip pain   Patient is a 64 y.o. male presenting with fall.  Fall This is a new problem. The current episode started 2 days ago. The problem occurs constantly. The problem has not changed since onset.Pertinent negatives include no chest pain, no abdominal pain, no headaches and no shortness of breath. The symptoms are aggravated by walking. Nothing relieves the symptoms. Treatments tried: ice, heating pads. The treatment provided no relief.    Past Medical History  Diagnosis Date  . Hyperlipidemia   . Hypertension     for a few years and resolved in 2013/2014.  Marland Kitchen Heart murmur   . DM II (diabetes mellitus, type II), controlled ([place])     type 2 IDDM x 15 years. A1C 1/09 13.7.   Marland Kitchen Sleep apnea     does not wear CPAP now  . 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 Arthritis     left knee  . 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 12/25/2011  . Chronic kidney disease     acute renal failure/injury requiring short-term HD 2013  . Hx of ileostomy ([place]) 10/13/2012  . Colon cancer [facility])    Past Surgical History  Procedure Laterality Date  . Colonoscopy    . Ganglion cyst excision  20 YRS AGO    RT ARM  . Partial colectomy  02/06/2012  . Partial colectomy  02/06/2012    Procedure: PARTIAL COLECTOMY;  Surgeon: Imogene Burn. [name], MD;  Location: [facility];  Service: General;  Laterality: N/A;  right partial colectomy  . Colonoscopy  02/06/2012    Procedure: COLONOSCOPY;  Surgeon: Milus Banister, MD;  Location: [facility];  Service: Endoscopy;;  . Laparotomy  02/16/2012    Procedure:  EXPLORATORY LAPAROTOMY;  Surgeon: Zenovia Jarred, MD;  Location: [facility] [facility];  Service: General;  Laterality: N/A;  Exploratory Laparotomy with resection of anastomosis  . Ileostomy  02/16/2012    Procedure: ILEOSTOMY;  Surgeon: Zenovia Jarred, MD;  Location: [place];  Service: General;  Laterality: N/A;  . Application of wound vac  02/16/2012    Procedure: APPLICATION OF WOUND VAC;  Surgeon: Zenovia Jarred, MD;  Location: [facility];  Service: General;  Laterality: N/A;  . Laparotomy  02/18/2012    Procedure: EXPLORATORY LAPAROTOMY;  Surgeon: Odis Hollingshead, MD;  Location: [facility];  Service: General;  Laterality: N/A;  exploratory laparotomy  change of abdominal vac dressing  . Dressing change under anesthesia  02/18/2012    Procedure: DRESSING CHANGE UNDER ANESTHESIA;  Surgeon: Odis Hollingshead, MD;  Location: [facility];  Service: General;  Laterality: N/A;  . Laparotomy  02/20/2012    Procedure: EXPLORATORY LAPAROTOMY;  Surgeon: Odis Hollingshead, MD;  Location: [facility];  Service: General;  Laterality: N/A;  . Vacuum assisted closure change  02/20/2012    Procedure: ABDOMINAL VACUUM ASSISTED CLOSURE CHANGE;  Surgeon: Odis Hollingshead, MD;  Location: [place];  Service: General;;  . Laparotomy  02/23/2012    Procedure: EXPLORATORY LAPAROTOMY;  Surgeon: Joyice Faster. Cornett, MD;  Location: [facility];  Service: General;  Laterality: N/A;  Irrigation and Debridement of abdominal wound with wound vac change with partial closure  . Incision and drainage of wound  02/23/2012    Procedure: IRRIGATION AND DEBRIDEMENT WOUND;  Surgeon: Joyice Faster. Cornett, MD;  Location: [place];  Service: General;  Laterality: N/A;  . Vacuum assisted closure change  02/23/2012    Procedure: ABDOMINAL VACUUM ASSISTED CLOSURE CHANGE;  Surgeon: Joyice Faster. Cornett, MD;  Location: [facility];  Service: General;  Laterality: N/A;  . Laparotomy  02/26/2012    Procedure: EXPLORATORY LAPAROTOMY;  Surgeon: Imogene Burn. [name], MD;  Location: [facility];  Service: General;   Laterality: N/A;     . Application of wound vac  02/26/2012    Procedure: APPLICATION OF WOUND VAC;  Surgeon: Imogene Burn. [name], MD;  Location: [facility];  Service: General;  Laterality: N/A;  . Colostomy reversal    . Iliostomy    . Resection small bowel / closure ileostomy  402/2014    Dr [name]  . Ileostomy closure N/A 09/02/2012    Procedure: ILEOSTOMY REVERSAL;  Surgeon: Imogene Burn. [name], MD;  Location: [facility];  Service: General;  Laterality: N/A;   Family History  Problem Relation Age of Onset  . Diabetes Father   . Heart disease Father   . Colon cancer Neg Hx   . Diabetes Sister   . Heart disease Sister   . Heart disease Mother    Social History  Substance Use Topics  . Smoking status: Current Every Day Smoker -- 0.50 packs/day for 30 years    Types: Cigarettes  . Smokeless tobacco: Never Used  . Alcohol Use: Yes     Comment: Liquor twice monthly.    Review of Systems  Constitutional: Negative for fever.  HENT: Negative for sore throat.   Eyes: Negative for visual disturbance.  Respiratory: Negative for shortness of breath.   Cardiovascular: Negative for chest pain.  Gastrointestinal: Negative for abdominal pain.  Genitourinary: Negative for difficulty urinating.  Musculoskeletal: Negative for back pain and neck stiffness.  Skin: Negative for rash.  Neurological: Negative for syncope and headaches.      Allergies  Review of patient's allergies indicates no known allergies.  Home Medications   Prior to Admission medications   Medication Sig Start Date End Date Taking? Authorizing Provider  amLODipine (NORVASC) 10 MG tablet Take 10 mg by mouth daily.   Yes Historical Provider, MD  atorvastatin (LIPITOR) 40 MG tablet Take 40 mg by mouth daily.   Yes Historical Provider, MD  insulin detemir (LEVEMIR) 100 UNIT/ML injection Inject 0.24 mLs (24 Units total) into the skin at bedtime. Patient taking differently: Inject 20 Units into the skin at bedtime.  12/08/13  Yes Vijaya  Mercer Pod, MD  amLODipine (NORVASC) 5 MG tablet Take 1 tablet (5 mg total) by mouth daily. Patient not taking: Reported on 05/24/2015 12/08/13   Malena Catholic, MD  diazepam (VALIUM) 5 MG tablet Take 1 tablet (5 mg total) by mouth every 8 (eight) hours as needed for muscle spasms. 05/24/15   Gareth Morgan, MD  ferrous sulfate 325 (65 FE) MG EC tablet Take 1 tablet (325 mg total) by mouth 2 (two) times daily with a meal. Patient not taking: Reported on 05/24/2015 12/07/13   Ladell Pier, MD   BP 154/100 mmHg  Pulse 75  Temp(Src) 98.3 F (36.8 C) (Oral)  Resp 18  Ht 6' (1.829 m)  Wt 250 lb (113.399 kg)  BMI 33.90 kg/m2  SpO2 98% Physical Exam  Constitutional: He is oriented to person, place, and time. He appears well-developed and well-nourished. No distress.  HENT:  Head: Normocephalic and atraumatic.  Eyes: Conjunctivae and EOM are normal.  Neck: Normal range of motion.  Cardiovascular: Normal rate, regular rhythm, normal heart sounds and intact distal pulses.  Exam reveals no gallop and no friction rub.   No murmur heard. Pulmonary/Chest: Effort normal and breath sounds normal. No respiratory distress. He has no wheezes. He has no rales.  Abdominal: Soft. He exhibits no distension. There is no tenderness. There is no guarding.  Musculoskeletal: He exhibits no edema.       Right hip: He exhibits decreased range of motion and tenderness.       Left hip: He exhibits normal range of motion.       Right knee: He exhibits no bony tenderness. No tenderness found.       Right ankle: He exhibits normal range of motion.       Left ankle: He exhibits normal range of motion.       Cervical back: He exhibits no bony tenderness.       Thoracic back: He exhibits no bony tenderness.       Lumbar back: He exhibits no bony tenderness.       Right upper leg: He exhibits tenderness and swelling (muscle spasm). He exhibits no deformity and no laceration.  Neurological: He is alert and  oriented to person, place, and time.  Skin: Skin is warm and dry. He is not diaphoretic.  Nursing note and vitals reviewed.   ED Course  Procedures (including critical care time) Labs Review Labs Reviewed  CBG MONITORING, ED - Abnormal; Notable for the following:    Glucose-Capillary 144 (*)    All other components within normal limits    Imaging Review Dg Hip Unilat With Pelvis 2-3 Views Right  05/24/2015  CLINICAL DATA:  Pain following fall 2 days prior EXAM: DG HIP (WITH OR WITHOUT PELVIS) 2-3V RIGHT COMPARISON:  None. FINDINGS: Frontal pelvis as well as frontal and lateral right hip images were obtained. There is no fracture or dislocation. There is moderate hip joint space narrowing bilaterally, symmetric. There is subchondral cystic change bilaterally. No erosive change. 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 No acute fracture or dislocation. Electronically Signed   By: Lowella Grip III M.D.   On: 05/24/2015 07:53   I have personally reviewed and evaluated these images and lab results as part of my medical decision-making.   EKG Interpretation None      MDM   Final diagnoses:  Right hip pain  Quadriceps muscle strain, right, initial encounter    64yo male with history of DM, htn, hlpd, CKD presents with concern for right thigh pain after fall 2 days ago.  Denies other injuries from fall, head trauma, neck pain, numbness/weakness and low suspicion for other injuries.  XR of right hip performed showing no sign of fracture.  Exam consistent with spasm of quadriceps muscle, consistent with quadriceps strain.  Able to flex/extend knee and doubt quad tendon rputure.  Given IM valium for pain, crutches for assistance with ambulation.  Rx for valium  Patient discharged in stable condition with understanding of reasons to return.    Gareth Morgan, MD 05/25/15 1306

## 2015-07-03 ENCOUNTER — Emergency Department (HOSPITAL_COMMUNITY): Payer: Medicare Other

## 2015-07-03 ENCOUNTER — Encounter (HOSPITAL_COMMUNITY): Payer: Self-pay | Admitting: Emergency Medicine

## 2015-07-03 ENCOUNTER — Emergency Department (HOSPITAL_COMMUNITY)
Admission: EM | Admit: 2015-07-03 | Discharge: 2015-07-03 | Disposition: A | Discharge status: Home / Self Care | Payer: Medicare Other | Attending: Emergency Medicine | Admitting: Emergency Medicine

## 2015-07-03 DIAGNOSIS — W01198A Fall on same level from slipping, tripping and stumbling with subsequent striking against other object, initial encounter: Secondary | ICD-10-CM | POA: Diagnosis not present

## 2015-07-03 DIAGNOSIS — Y9289 Other specified places as the place of occurrence of the external cause: Secondary | ICD-10-CM | POA: Insufficient documentation

## 2015-07-03 DIAGNOSIS — S29001A Unspecified injury of muscle and tendon of front wall of thorax, initial encounter: Secondary | ICD-10-CM | POA: Diagnosis present

## 2015-07-03 DIAGNOSIS — Z85038 Personal history of other malignant neoplasm of large intestine: Secondary | ICD-10-CM | POA: Insufficient documentation

## 2015-07-03 DIAGNOSIS — Z79899 Other long term (current) drug therapy: Secondary | ICD-10-CM | POA: Insufficient documentation

## 2015-07-03 DIAGNOSIS — Z794 Long term (current) use of insulin: Secondary | ICD-10-CM | POA: Diagnosis not present

## 2015-07-03 DIAGNOSIS — F17210 Nicotine dependence, cigarettes, uncomplicated: Secondary | ICD-10-CM | POA: Diagnosis not present

## 2015-07-03 DIAGNOSIS — E785 Hyperlipidemia, unspecified: Secondary | ICD-10-CM | POA: Insufficient documentation

## 2015-07-03 DIAGNOSIS — S2232XA Fracture of one rib, left side, initial encounter for closed fracture: Secondary | ICD-10-CM | POA: Insufficient documentation

## 2015-07-03 DIAGNOSIS — Z86010 Personal history of colonic polyps: Secondary | ICD-10-CM | POA: Diagnosis not present

## 2015-07-03 DIAGNOSIS — E119 Type 2 diabetes mellitus without complications: Secondary | ICD-10-CM | POA: Diagnosis not present

## 2015-07-03 DIAGNOSIS — I129 Hypertensive chronic kidney disease with stage 1 through stage 4 chronic kidney disease, or unspecified chronic kidney disease: Secondary | ICD-10-CM | POA: Diagnosis not present

## 2015-07-03 DIAGNOSIS — R011 Cardiac murmur, unspecified: Secondary | ICD-10-CM | POA: Diagnosis not present

## 2015-07-03 DIAGNOSIS — N189 Chronic kidney disease, unspecified: Secondary | ICD-10-CM | POA: Insufficient documentation

## 2015-07-03 DIAGNOSIS — Y998 Other external cause status: Secondary | ICD-10-CM | POA: Insufficient documentation

## 2015-07-03 DIAGNOSIS — Y9389 Activity, other specified: Secondary | ICD-10-CM | POA: Diagnosis not present

## 2015-07-03 IMAGING — DX DG RIBS W/ CHEST 3+V*L*
5 series · 5 of 5 positions shown · non-contrast
Comparison: None.

CLINICAL DATA: Status post fall, left-sided chest pain

EXAM:
LEFT RIBS AND CHEST - 3+ VIEW

[chest pa (1 of 2)]
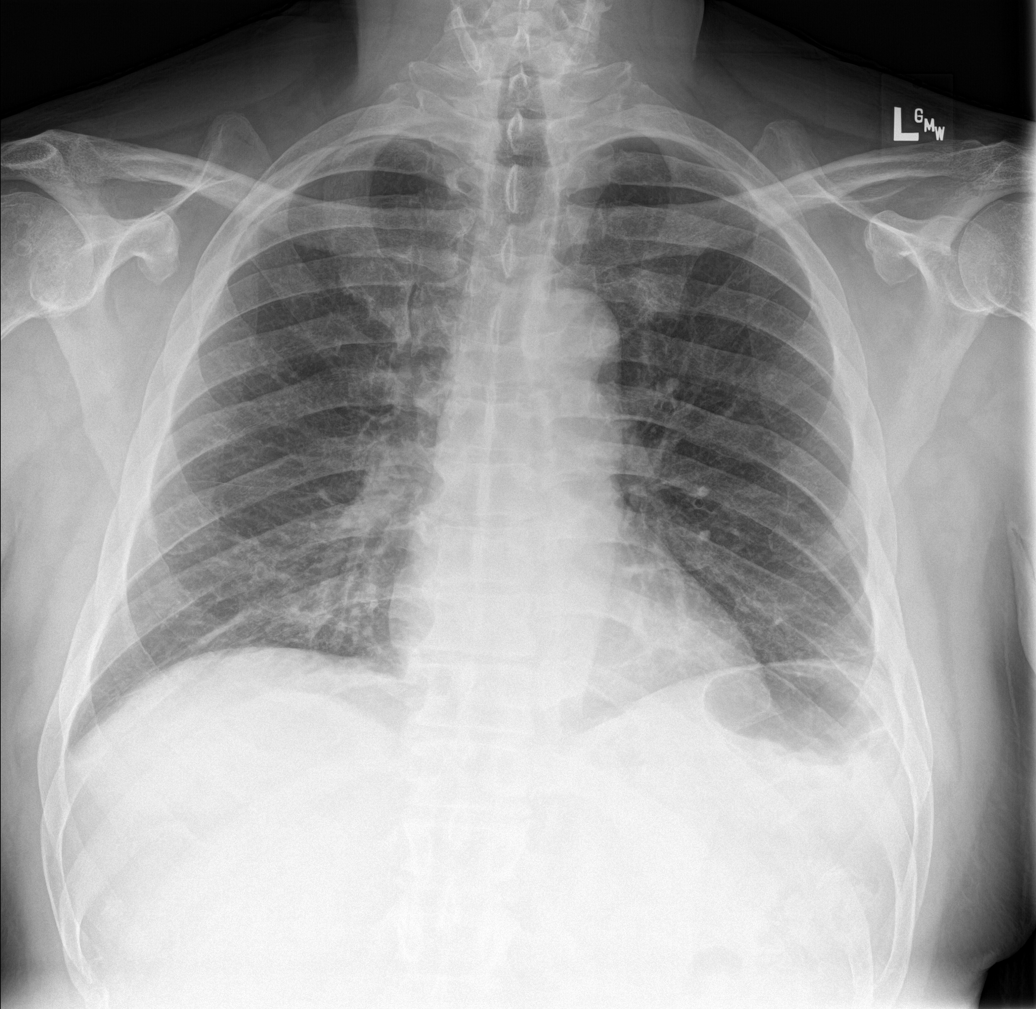

[rib pa obl (1 of 2)]
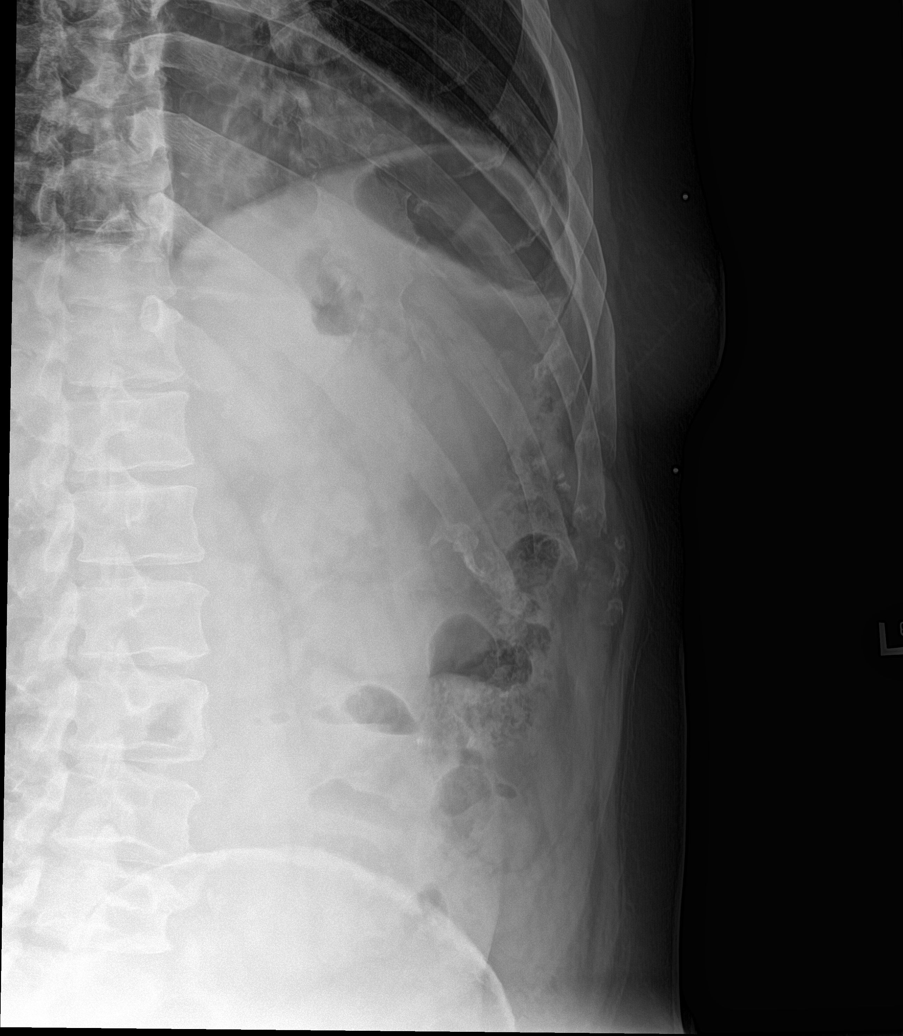

[rib pa obl (2 of 2)]
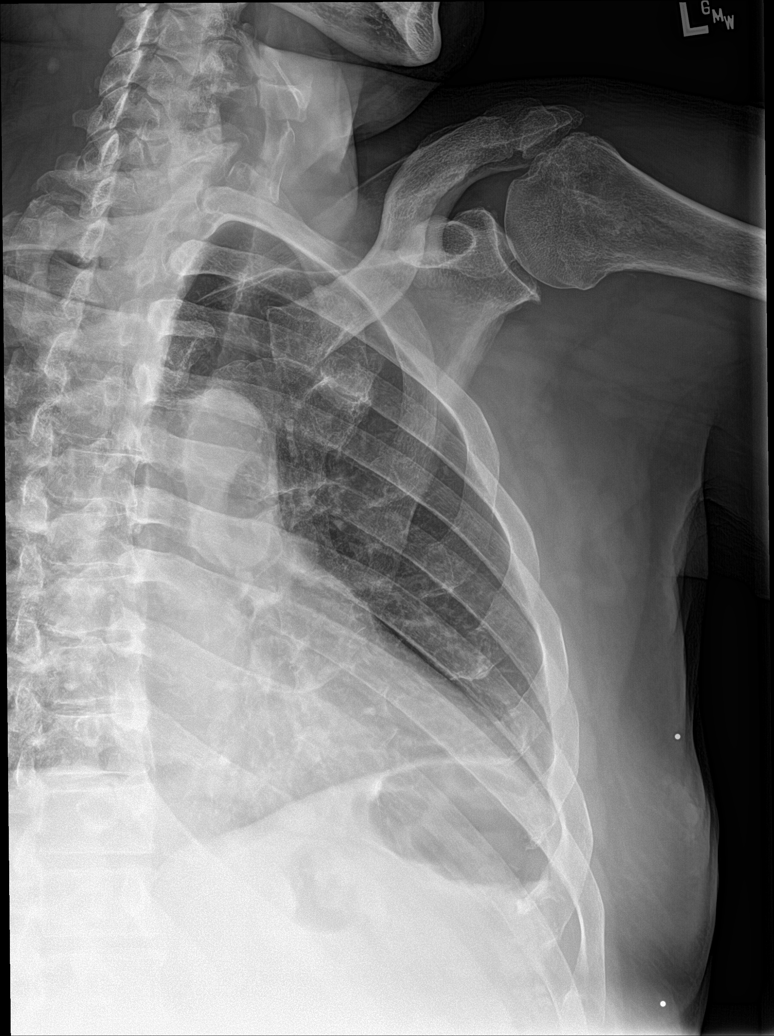

[rib pa]
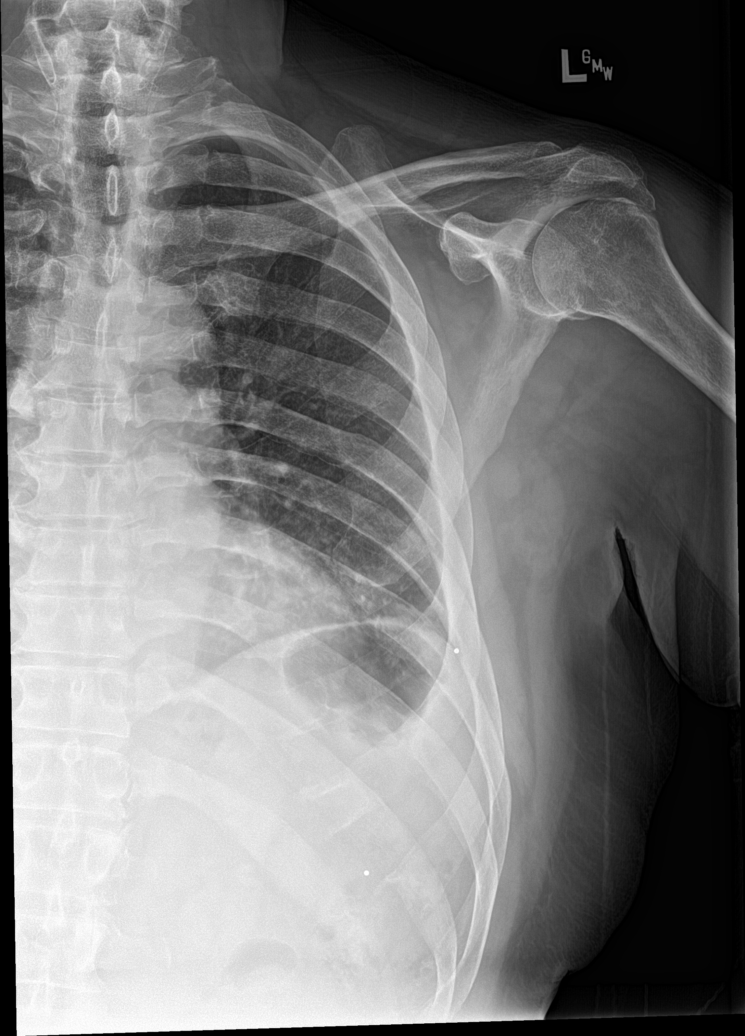

[chest pa (2 of 2)]
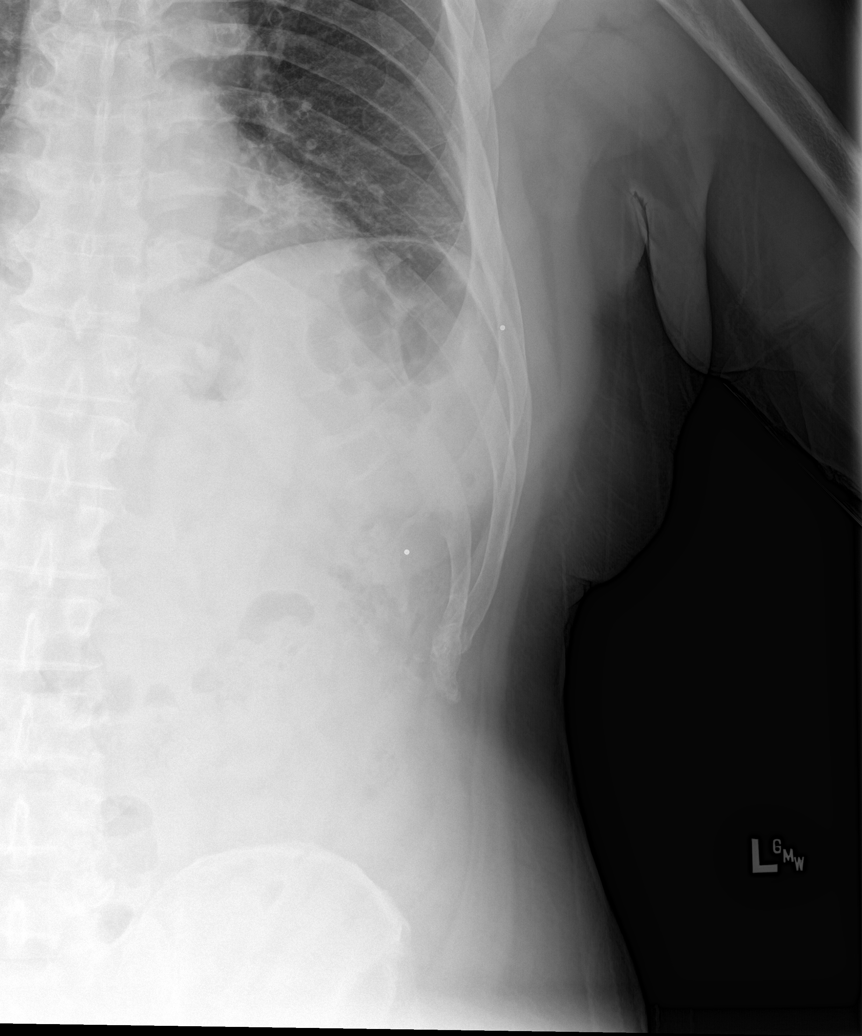

[5 of 5 positions shown; findings below may reference images not displayed]

FINDINGS: There is a nondisplaced left lateral eighth rib fracture. There is
no evidence of pneumothorax or pleural effusion. Both lungs are
clear. Heart size and mediastinal contours are within normal limits.
IMPRESSION: Nondisplaced left lateral eighth rib fracture.

## 2015-07-03 MED ORDER — IBUPROFEN 800 MG PO TABS: 800.0000 mg | ORAL_TABLET | Freq: Three times a day (TID) | ORAL | Status: DC

## 2015-07-03 MED ORDER — OXYCODONE-ACETAMINOPHEN 5-325 MG PO TABS
2.0000 | ORAL_TABLET | Freq: Once | ORAL | Status: AC
Start: 2015-07-03 — End: 2015-07-03
  Administered 2015-07-03: 2 via ORAL
  Filled 2015-07-03: qty 2

## 2015-07-03 MED ORDER — HYDROCODONE-ACETAMINOPHEN 5-325 MG PO TABS: 2.0000 | ORAL_TABLET | ORAL | Status: DC | PRN

## 2015-07-03 NOTE — ED Notes (Signed)
Pt verbalized understanding of d/c instructions and has no further questions. Pt stable and NAD. Pt to follow up with [facility] in 2 days.

## 2015-07-03 NOTE — ED Notes (Signed)
Pt sts trip and fall today hitting left rib area; pt sts pain in that area since fall; pt sts multiple falls recently

## 2015-07-03 NOTE — ED Provider Notes (Signed)
CSN: [id number]     Arrival date & time 07/03/15  1521 History   First MD Initiated Contact with Patient 07/03/15 1852     Chief Complaint  Patient presents with  . Fall     (Consider location/radiation/quality/duration/timing/severity/associated sxs/prior Treatment) HPI Comments: The patient is a 65 year old male, he has a history of occasional falls, while he was walking his dog earlier today he slipped and fell striking his left ribs against his cell phone against the ground and suffered acute onset of pain in the left anterolateral ribs. This is persistent, worse with deep breathing, not associated with any other injuries. At this time the symptoms are moderate.  Patient is a 65 y.o. male presenting with fall. The history is provided by the patient.  Fall    Past Medical History  Diagnosis Date  . Hyperlipidemia   . Hypertension     for a few years and resolved in 2013/2014.  Marland Kitchen Heart murmur   . DM II (diabetes mellitus, type II), controlled ([place])     type 2 IDDM x 15 years. A1C 1/09 13.7.   Marland Kitchen Sleep apnea     does not wear CPAP now  . 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 Arthritis     left knee  . 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 12/25/2011  . Chronic kidney disease     acute renal failure/injury requiring short-term HD 2013  . 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 Colon cancer [facility])    Past Surgical History  Procedure Laterality Date  . Colonoscopy    . Ganglion cyst excision  20 YRS AGO    RT ARM  . Partial colectomy  02/06/2012  . Partial colectomy  02/06/2012    Procedure: PARTIAL COLECTOMY;  Surgeon: Imogene Burn. [name], MD;  Location: [place];  Service: General;  Laterality: N/A;  right partial colectomy  . Colonoscopy  02/06/2012    Procedure: COLONOSCOPY;  Surgeon: Milus Banister, MD;  Location: [place];  Service: Endoscopy;;  . Laparotomy  02/16/2012    Procedure: EXPLORATORY LAPAROTOMY;  Surgeon: Zenovia Jarred,  MD;  Location: [place];  Service: General;  Laterality: N/A;  Exploratory Laparotomy with resection of anastomosis  . Ileostomy  02/16/2012    Procedure: ILEOSTOMY;  Surgeon: Zenovia Jarred, MD;  Location: [place];  Service: General;  Laterality: N/A;  . Application of wound vac  02/16/2012    Procedure: APPLICATION OF WOUND VAC;  Surgeon: Zenovia Jarred, MD;  Location: [place];  Service: General;  Laterality: N/A;  . Laparotomy  02/18/2012    Procedure: EXPLORATORY LAPAROTOMY;  Surgeon: Odis Hollingshead, MD;  Location: [place];  Service: General;  Laterality: N/A;  exploratory laparotomy  change of abdominal vac dressing  . Dressing change under anesthesia  02/18/2012    Procedure: DRESSING CHANGE UNDER ANESTHESIA;  Surgeon: Odis Hollingshead, MD;  Location: [place];  Service: General;  Laterality: N/A;  . Laparotomy  02/20/2012    Procedure: EXPLORATORY LAPAROTOMY;  Surgeon: Odis Hollingshead, MD;  Location: [place];  Service: General;  Laterality: N/A;  . Vacuum assisted closure change  02/20/2012    Procedure: ABDOMINAL VACUUM ASSISTED CLOSURE CHANGE;  Surgeon: Odis Hollingshead, MD;  Location: [place];  Service: General;;  . Laparotomy  02/23/2012    Procedure: EXPLORATORY LAPAROTOMY;  Surgeon: Joyice Faster. Cornett, MD;  Location: [place];  Service: General;  Laterality:  N/A;  Irrigation and Debridement of abdominal wound with wound vac change with partial closure  . Incision and drainage of wound  02/23/2012    Procedure: IRRIGATION AND DEBRIDEMENT WOUND;  Surgeon: Joyice Faster. Cornett, MD;  Location: [place];  Service: General;  Laterality: N/A;  . Vacuum assisted closure change  02/23/2012    Procedure: ABDOMINAL VACUUM ASSISTED CLOSURE CHANGE;  Surgeon: Joyice Faster. Cornett, MD;  Location: [place];  Service: General;  Laterality: N/A;  . Laparotomy  02/26/2012    Procedure: EXPLORATORY LAPAROTOMY;  Surgeon: Imogene Burn. [name], MD;  Location: [place];  Service: General;  Laterality: N/A;     . Application of wound vac   02/26/2012    Procedure: APPLICATION OF WOUND VAC;  Surgeon: Imogene Burn. [name], MD;  Location: [place];  Service: General;  Laterality: N/A;  . Colostomy reversal    . Iliostomy    . Resection small bowel / closure ileostomy  402/2014    Dr [name]  . Ileostomy closure N/A 09/02/2012    Procedure: ILEOSTOMY REVERSAL;  Surgeon: Imogene Burn. [name], MD;  Location: [place] [facility];  Service: General;  Laterality: N/A;   Family History  Problem Relation Age of Onset  . Diabetes Father   . Heart disease Father   . Colon cancer Neg Hx   . Diabetes Sister   . Heart disease Sister   . Heart disease Mother    Social History  Substance Use Topics  . Smoking status: Current Every Day Smoker -- 0.50 packs/day for 30 years    Types: Cigarettes  . Smokeless tobacco: Never Used  . Alcohol Use: Yes     Comment: Liquor twice monthly.    Review of Systems  All other systems reviewed and are negative.     Allergies  Review of patient's allergies indicates no known allergies.  Home Medications   Prior to Admission medications   Medication Sig Start Date End Date Taking? Authorizing Provider  amLODipine (NORVASC) 10 MG tablet Take 10 mg by mouth daily.    Historical Provider, MD  amLODipine (NORVASC) 5 MG tablet Take 1 tablet (5 mg total) by mouth daily. Patient not taking: Reported on 05/24/2015 12/08/13   Malena Catholic, MD  atorvastatin (LIPITOR) 40 MG tablet Take 40 mg by mouth daily.    Historical Provider, MD  diazepam (VALIUM) 5 MG tablet Take 1 tablet (5 mg total) by mouth every 8 (eight) hours as needed for muscle spasms. 05/24/15   Gareth Morgan, MD  ferrous sulfate 325 (65 FE) MG EC tablet Take 1 tablet (325 mg total) by mouth 2 (two) times daily with a meal. Patient not taking: Reported on 05/24/2015 12/07/13   Ladell Pier, MD  HYDROcodone-acetaminophen (NORCO/VICODIN) 5-325 MG tablet Take 2 tablets by mouth every 4 (four) hours as needed. 07/03/15   Noemi Chapel, MD  ibuprofen  (ADVIL,MOTRIN) 800 MG tablet Take 1 tablet (800 mg total) by mouth 3 (three) times daily. 07/03/15   Noemi Chapel, MD  insulin detemir (LEVEMIR) 100 UNIT/ML injection Inject 0.24 mLs (24 Units total) into the skin at bedtime. Patient taking differently: Inject 20 Units into the skin at bedtime.  12/08/13   Malena Catholic, MD   BP 161/54 mmHg  Pulse 64  Temp(Src) 98 F (36.7 C) (Oral)  Resp 16  SpO2 97% Physical Exam  Constitutional: He appears well-developed and well-nourished. No distress.  HENT:  Head: Normocephalic and atraumatic.  Mouth/Throat: Oropharynx is clear and moist. No oropharyngeal  exudate.  Eyes: Conjunctivae and EOM are normal. Pupils are equal, round, and reactive to light. Right eye exhibits no discharge. Left eye exhibits no discharge. No scleral icterus.  Neck: Normal range of motion. Neck supple. No JVD present. No thyromegaly present.  Cardiovascular: Normal rate, regular rhythm, normal heart sounds and intact distal pulses.  Exam reveals no gallop and no friction rub.   No murmur heard. Pulmonary/Chest: Effort normal and breath sounds normal. No respiratory distress. He has no wheezes. He has no rales. He exhibits tenderness ( Anterolateral chest tenderness without crepitance or subcutaneous emphysema on the left).  Abdominal: Soft. Bowel sounds are normal. He exhibits no distension and no mass. There is no tenderness.  Musculoskeletal: Normal range of motion. He exhibits no edema or tenderness.  Lymphadenopathy:    He has no cervical adenopathy.  Neurological: He is alert. Coordination normal.  Skin: Skin is warm and dry. No rash noted. No erythema.  Psychiatric: He has a normal mood and affect. His behavior is normal.  Nursing note and vitals reviewed.   ED Course  Procedures (including critical care time) Labs Review Labs Reviewed - No data to display  Imaging Review Dg Ribs Unilateral W/chest Left  07/03/2015  CLINICAL DATA:  Status post fall,  left-sided chest pain EXAM: LEFT RIBS AND CHEST - 3+ VIEW COMPARISON:  None. FINDINGS: There is a nondisplaced left lateral eighth rib fracture. There is no evidence of pneumothorax or pleural effusion. Both lungs are clear. Heart size and mediastinal contours are within normal limits. IMPRESSION: Nondisplaced left lateral eighth rib fracture. Electronically Signed   By: Kathreen Devoid   On: 07/03/2015 16:59   I have personally reviewed and evaluated these images and lab results as part of my medical decision-making.    MDM   Final diagnoses:  Rib fracture, left, closed, initial encounter    The patient has normal oxygenation, normal no tachycardia, no fever, chest x-ray does reveal a single rib with a fracture on the left eighth rib, this was explained to the patient in detail, he will need incentive spirometer and some pain control, anticipate discharge home as there is no other underlying issues. He reports that he ambulates with a cane at baseline, most days R great without any difficulty with balance and at this time is no difficulty with balance.  Meds given in ED:  Medications  oxyCODONE-acetaminophen (PERCOCET/ROXICET) 5-325 MG per tablet 2 tablet (2 tablets Oral Given 07/03/15 1953)    New Prescriptions   HYDROCODONE-ACETAMINOPHEN (NORCO/VICODIN) 5-325 MG TABLET    Take 2 tablets by mouth every 4 (four) hours as needed.   IBUPROFEN (ADVIL,MOTRIN) 800 MG TABLET    Take 1 tablet (800 mg total) by mouth 3 (three) times daily.       Noemi Chapel, MD 07/03/15 2029

## 2015-07-03 NOTE — Discharge Instructions (Signed)

## 2016-06-24 ENCOUNTER — Encounter: Payer: Self-pay | Admitting: Gastroenterology

## 2017-11-11 ENCOUNTER — Other Ambulatory Visit: Payer: Self-pay | Admitting: Nephrology

## 2017-11-11 DIAGNOSIS — N184 Chronic kidney disease, stage 4 (severe): Secondary | ICD-10-CM

## 2017-11-17 ENCOUNTER — Other Ambulatory Visit (HOSPITAL_COMMUNITY): Payer: Self-pay | Admitting: *Deleted

## 2017-11-18 ENCOUNTER — Ambulatory Visit (HOSPITAL_COMMUNITY): Admission: RE | Admit: 2017-11-18 | Payer: Medicare Other | Source: Ambulatory Visit

## 2017-11-25 ENCOUNTER — Ambulatory Visit (HOSPITAL_COMMUNITY)
Admission: RE | Admit: 2017-11-25 | Discharge: 2017-11-25 | Disposition: A | Discharge status: Home / Self Care | Payer: Medicare Other | Source: Ambulatory Visit | Attending: Nephrology | Admitting: Nephrology

## 2017-12-02 ENCOUNTER — Encounter (HOSPITAL_COMMUNITY): Payer: Medicare Other

## 2017-12-30 ENCOUNTER — Encounter (HOSPITAL_COMMUNITY)
Admission: RE | Admit: 2017-12-30 | Discharge: 2017-12-30 | Disposition: A | Discharge status: Home / Self Care | Payer: Medicare Other | Source: Ambulatory Visit | Attending: Nephrology | Admitting: Nephrology

## 2017-12-30 VITALS — BP 174/74 | HR 59 | Temp 97.7°F | Resp 16

## 2017-12-30 DIAGNOSIS — N184 Chronic kidney disease, stage 4 (severe): Secondary | ICD-10-CM | POA: Diagnosis not present

## 2017-12-30 DIAGNOSIS — N183 Chronic kidney disease, stage 3 unspecified: Secondary | ICD-10-CM

## 2017-12-30 DIAGNOSIS — D631 Anemia in chronic kidney disease: Secondary | ICD-10-CM | POA: Insufficient documentation

## 2017-12-30 MED ORDER — EPOETIN ALFA 40000 UNIT/ML IJ SOLN: 30000.0000 [IU] | INTRAMUSCULAR | Status: DC

## 2017-12-30 MED ORDER — SODIUM CHLORIDE 0.9 % IV SOLN
510.0000 mg | INTRAVENOUS | Status: DC
  Administered 2017-12-30: 510 mg via INTRAVENOUS
  Filled 2017-12-30: qty 17

## 2017-12-30 MED ORDER — EPOETIN ALFA 10000 UNIT/ML IJ SOLN
INTRAMUSCULAR | Status: AC
  Administered 2017-12-30: 10000 [IU]
  Filled 2017-12-30: qty 1

## 2017-12-30 MED ORDER — EPOETIN ALFA 20000 UNIT/ML IJ SOLN
INTRAMUSCULAR | Status: AC
  Administered 2017-12-30: 20000 [IU]
  Filled 2017-12-30: qty 1

## 2017-12-30 NOTE — Discharge Instructions (Signed)
Epoetin Alfa injection °What is this medicine? °EPOETIN ALFA (e POE e tin AL fa) helps your body make more red blood cells. This medicine is used to treat anemia caused by chronic kidney failure, cancer chemotherapy, or HIV-therapy. It may also be used before surgery if you have anemia. °This medicine may be used for other purposes; ask your health care provider or pharmacist if you have questions. °COMMON BRAND NAME(S): Epogen, Procrit °What should I tell my health care provider before I take this medicine? °They need to know if you have any of these conditions: °-blood clotting disorders °-cancer patient not on chemotherapy °-cystic fibrosis °-heart disease, such as angina or heart failure °-hemoglobin level of 12 g/dL or greater °-high blood pressure °-low levels of folate, iron, or vitamin B12 °-seizures °-an unusual or allergic reaction to erythropoietin, albumin, benzyl alcohol, hamster proteins, other medicines, foods, dyes, or preservatives °-pregnant or trying to get pregnant °-breast-feeding °How should I use this medicine? °This medicine is for injection into a vein or under the skin. It is usually given by a health care professional in a hospital or clinic setting. °If you get this medicine at home, you will be taught how to prepare and give this medicine. Use exactly as directed. Take your medicine at regular intervals. Do not take your medicine more often than directed. °It is important that you put your used needles and syringes in a special sharps container. Do not put them in a trash can. If you do not have a sharps container, call your pharmacist or healthcare provider to get one. °A special MedGuide will be given to you by the pharmacist with each prescription and refill. Be sure to read this information carefully each time. °Talk to your pediatrician regarding the use of this medicine in children. While this drug may be prescribed for selected conditions, precautions do apply. °Overdosage: If you  think you have taken too much of this medicine contact a poison control center or emergency room at once. °NOTE: This medicine is only for you. Do not share this medicine with others. °What if I miss a dose? °If you miss a dose, take it as soon as you can. If it is almost time for your next dose, take only that dose. Do not take double or extra doses. °What may interact with this medicine? °Do not take this medicine with any of the following medications: °-darbepoetin alfa °This list may not describe all possible interactions. Give your health care provider a list of all the medicines, herbs, non-prescription drugs, or dietary supplements you use. Also tell them if you smoke, drink alcohol, or use illegal drugs. Some items may interact with your medicine. °What should I watch for while using this medicine? °Your condition will be monitored carefully while you are receiving this medicine. °You may need blood work done while you are taking this medicine. °What side effects may I notice from receiving this medicine? °Side effects that you should report to your doctor or health care professional as soon as possible: °-allergic reactions like skin rash, itching or hives, swelling of the face, lips, or tongue °-breathing problems °-changes in vision °-chest pain °-confusion, trouble speaking or understanding °-feeling faint or lightheaded, falls °-high blood pressure °-muscle aches or pains °-pain, swelling, warmth in the leg °-rapid weight gain °-severe headaches °-sudden numbness or weakness of the face, arm or leg °-trouble walking, dizziness, loss of balance or coordination °-seizures (convulsions) °-swelling of the ankles, feet, hands °-unusually weak or tired °  Side effects that usually do not require medical attention (report to your doctor or health care professional if they continue or are bothersome): °-diarrhea °-fever, chills (flu-like symptoms) °-headaches °-nausea, vomiting °-redness, stinging, or swelling at  site where injected °This list may not describe all possible side effects. Call your doctor for medical advice about side effects. You may report side effects to FDA at 1-800-FDA-1088. °Where should I keep my medicine? °Keep out of the reach of children. °Store in a refrigerator between 2 and 8 degrees C (36 and 46 degrees F). Do not freeze or shake. Throw away any unused portion if using a single-dose vial. Multi-dose vials can be kept in the refrigerator for up to 21 days after the initial dose. Throw away unused medicine. °NOTE: This sheet is a summary. It may not cover all possible information. If you have questions about this medicine, talk to your doctor, pharmacist, or health care provider. °© 2018 [employer] (2016-01-08 19:42:31) ° °Ferumoxytol injection °What is this medicine? °FERUMOXYTOL is an iron complex. Iron is used to make healthy red blood cells, which carry oxygen and nutrients throughout the body. This medicine is used to treat iron deficiency anemia in people with chronic kidney disease. °This medicine may be used for other purposes; ask your health care provider or pharmacist if you have questions. °COMMON BRAND NAME(S): Feraheme °What should I tell my health care provider before I take this medicine? °They need to know if you have any of these conditions: °-anemia not caused by low iron levels °-high levels of iron in the blood °-magnetic resonance imaging (MRI) test scheduled °-an unusual or allergic reaction to iron, other medicines, foods, dyes, or preservatives °-pregnant or trying to get pregnant °-breast-feeding °How should I use this medicine? °This medicine is for injection into a vein. It is given by a health care professional in a hospital or clinic setting. °Talk to your pediatrician regarding the use of this medicine in children. Special care may be needed. °Overdosage: If you think you have taken too much of this medicine contact a poison control center or emergency  room at once. °NOTE: This medicine is only for you. Do not share this medicine with others. °What if I miss a dose? °It is important not to miss your dose. Call your doctor or health care professional if you are unable to keep an appointment. °What may interact with this medicine? °This medicine may interact with the following medications: °-other iron products °This list may not describe all possible interactions. Give your health care provider a list of all the medicines, herbs, non-prescription drugs, or dietary supplements you use. Also tell them if you smoke, drink alcohol, or use illegal drugs. Some items may interact with your medicine. °What should I watch for while using this medicine? °Visit your doctor or healthcare professional regularly. Tell your doctor or healthcare professional if your symptoms do not start to get better or if they get worse. You may need blood work done while you are taking this medicine. °You may need to follow a special diet. Talk to your doctor. Foods that contain iron include: whole grains/cereals, dried fruits, beans, or peas, leafy green vegetables, and organ meats (liver, kidney). °What side effects may I notice from receiving this medicine? °Side effects that you should report to your doctor or health care professional as soon as possible: °-allergic reactions like skin rash, itching or hives, swelling of the face, lips, or tongue °-breathing problems °-changes in blood pressure °-feeling   faint or lightheaded, falls °-fever or chills °-flushing, sweating, or hot feelings °-swelling of the ankles or feet °Side effects that usually do not require medical attention (report to your doctor or health care professional if they continue or are bothersome): °-diarrhea °-headache °-nausea, vomiting °-stomach pain °This list may not describe all possible side effects. Call your doctor for medical advice about side effects. You may report side effects to FDA at 1-800-FDA-1088. °Where  should I keep my medicine? °This drug is given in a hospital or clinic and will not be stored at home. °NOTE: This sheet is a summary. It may not cover all possible information. If you have questions about this medicine, talk to your doctor, pharmacist, or health care provider. °© 2018 [employer] (2015-06-22 12:41:49) ° ° ° °

## 2017-12-31 LAB — POCT HEMOGLOBIN-HEMACUE: Hemoglobin: 9.1 g/dL — ABNORMAL LOW (ref 13.0–17.0)

## 2018-01-05 ENCOUNTER — Other Ambulatory Visit (HOSPITAL_COMMUNITY): Payer: Self-pay | Admitting: *Deleted

## 2018-01-06 ENCOUNTER — Ambulatory Visit
Admission: RE | Admit: 2018-01-06 | Discharge: 2018-01-06 | Disposition: A | Discharge status: Home / Self Care | Payer: Non-veteran care | Source: Ambulatory Visit | Attending: Nephrology | Admitting: Nephrology

## 2018-01-06 ENCOUNTER — Ambulatory Visit (HOSPITAL_COMMUNITY)
Admission: RE | Admit: 2018-01-06 | Discharge: 2018-01-06 | Disposition: A | Discharge status: Home / Self Care | Payer: Medicare Other | Source: Ambulatory Visit | Attending: Nephrology | Admitting: Nephrology

## 2018-01-06 DIAGNOSIS — N189 Chronic kidney disease, unspecified: Secondary | ICD-10-CM | POA: Insufficient documentation

## 2018-01-06 DIAGNOSIS — D631 Anemia in chronic kidney disease: Secondary | ICD-10-CM | POA: Diagnosis present

## 2018-01-06 DIAGNOSIS — N184 Chronic kidney disease, stage 4 (severe): Secondary | ICD-10-CM

## 2018-01-06 MED ORDER — SODIUM CHLORIDE 0.9 % IV SOLN
510.0000 mg | INTRAVENOUS | Status: AC
  Administered 2018-01-06: 510 mg via INTRAVENOUS
  Filled 2018-01-06: qty 17

## 2018-01-20 ENCOUNTER — Encounter (HOSPITAL_COMMUNITY)
Admission: RE | Admit: 2018-01-20 | Discharge: 2018-01-20 | Disposition: A | Discharge status: Home / Self Care | Payer: Medicare Other | Source: Ambulatory Visit | Attending: Nephrology | Admitting: Nephrology

## 2018-01-20 VITALS — BP 161/69 | HR 62 | Temp 98.0°F | Ht 72.0 in | Wt 280.0 lb

## 2018-01-20 DIAGNOSIS — N184 Chronic kidney disease, stage 4 (severe): Secondary | ICD-10-CM | POA: Insufficient documentation

## 2018-01-20 DIAGNOSIS — D631 Anemia in chronic kidney disease: Secondary | ICD-10-CM | POA: Insufficient documentation

## 2018-01-20 DIAGNOSIS — N183 Chronic kidney disease, stage 3 unspecified: Secondary | ICD-10-CM

## 2018-01-20 LAB — IRON AND TIBC
Iron: 73 ug/dL (ref 45–182)
Saturation Ratios: 24 % (ref 17.9–39.5)
TIBC: 307 ug/dL (ref 250–450)
UIBC: 234 ug/dL

## 2018-01-20 LAB — FERRITIN: Ferritin: 116 ng/mL (ref 24–336)

## 2018-01-20 LAB — POCT HEMOGLOBIN-HEMACUE: Hemoglobin: 10.4 g/dL — ABNORMAL LOW (ref 13.0–17.0)

## 2018-01-20 MED ORDER — EPOETIN ALFA 10000 UNIT/ML IJ SOLN
INTRAMUSCULAR | Status: AC
  Administered 2018-01-20: 10000 [IU]
  Filled 2018-01-20: qty 1

## 2018-01-20 MED ORDER — EPOETIN ALFA 40000 UNIT/ML IJ SOLN: 30000.0000 [IU] | INTRAMUSCULAR | Status: DC

## 2018-01-20 MED ORDER — EPOETIN ALFA 20000 UNIT/ML IJ SOLN
INTRAMUSCULAR | Status: AC
  Administered 2018-01-20: 20000 [IU]
  Filled 2018-01-20: qty 1

## 2018-02-09 ENCOUNTER — Other Ambulatory Visit (HOSPITAL_COMMUNITY): Payer: Self-pay | Admitting: *Deleted

## 2018-02-10 ENCOUNTER — Encounter (HOSPITAL_COMMUNITY)
Admission: RE | Admit: 2018-02-10 | Discharge: 2018-02-10 | Disposition: A | Discharge status: Home / Self Care | Payer: Medicare Other | Source: Ambulatory Visit | Attending: Nephrology | Admitting: Nephrology

## 2018-02-10 VITALS — BP 141/58 | HR 63 | Temp 98.8°F | Resp 20 | Ht 72.0 in | Wt 233.4 lb

## 2018-02-10 DIAGNOSIS — D631 Anemia in chronic kidney disease: Secondary | ICD-10-CM | POA: Diagnosis present

## 2018-02-10 DIAGNOSIS — N184 Chronic kidney disease, stage 4 (severe): Secondary | ICD-10-CM | POA: Insufficient documentation

## 2018-02-10 DIAGNOSIS — N183 Chronic kidney disease, stage 3 unspecified: Secondary | ICD-10-CM

## 2018-02-10 LAB — POCT HEMOGLOBIN-HEMACUE: Hemoglobin: 10.5 g/dL — ABNORMAL LOW (ref 13.0–17.0)

## 2018-02-10 MED ORDER — SODIUM CHLORIDE 0.9 % IV SOLN
510.0000 mg | Freq: Once | INTRAVENOUS | Status: AC
  Administered 2018-02-10: 510 mg via INTRAVENOUS
  Filled 2018-02-10: qty 17

## 2018-02-10 MED ORDER — EPOETIN ALFA 20000 UNIT/ML IJ SOLN
INTRAMUSCULAR | Status: AC
  Administered 2018-02-10: 20000 [IU] via SUBCUTANEOUS
  Filled 2018-02-10: qty 1

## 2018-02-10 MED ORDER — EPOETIN ALFA 40000 UNIT/ML IJ SOLN: 30000.0000 [IU] | INTRAMUSCULAR | Status: DC

## 2018-02-10 MED ORDER — EPOETIN ALFA 10000 UNIT/ML IJ SOLN
INTRAMUSCULAR | Status: AC
  Administered 2018-02-10: 10000 [IU] via SUBCUTANEOUS
  Filled 2018-02-10: qty 1

## 2018-03-03 ENCOUNTER — Inpatient Hospital Stay (HOSPITAL_COMMUNITY): Admission: RE | Admit: 2018-03-03 | Payer: Medicare Other | Source: Ambulatory Visit

## 2018-03-24 ENCOUNTER — Encounter (HOSPITAL_COMMUNITY): Payer: Medicare Other

## 2018-10-13 ENCOUNTER — Ambulatory Visit: Payer: Non-veteran care | Admitting: Cardiovascular Disease

## 2019-01-08 ENCOUNTER — Ambulatory Visit (INDEPENDENT_AMBULATORY_CARE_PROVIDER_SITE_OTHER): Payer: No Typology Code available for payment source | Admitting: Cardiovascular Disease

## 2019-01-08 ENCOUNTER — Encounter: Payer: Self-pay | Admitting: Cardiovascular Disease

## 2019-01-08 ENCOUNTER — Other Ambulatory Visit: Payer: Self-pay

## 2019-01-08 VITALS — BP 148/68 | HR 74 | Temp 97.7°F | Ht 71.5 in | Wt 251.0 lb

## 2019-01-08 DIAGNOSIS — R0989 Other specified symptoms and signs involving the circulatory and respiratory systems: Secondary | ICD-10-CM

## 2019-01-08 DIAGNOSIS — F17200 Nicotine dependence, unspecified, uncomplicated: Secondary | ICD-10-CM | POA: Diagnosis not present

## 2019-01-08 DIAGNOSIS — I10 Essential (primary) hypertension: Secondary | ICD-10-CM | POA: Diagnosis not present

## 2019-01-08 DIAGNOSIS — Z01818 Encounter for other preprocedural examination: Secondary | ICD-10-CM | POA: Insufficient documentation

## 2019-01-08 DIAGNOSIS — E785 Hyperlipidemia, unspecified: Secondary | ICD-10-CM | POA: Diagnosis not present

## 2019-01-08 DIAGNOSIS — R011 Cardiac murmur, unspecified: Secondary | ICD-10-CM | POA: Insufficient documentation

## 2019-01-08 NOTE — Progress Notes (Signed)
01/08/2019 [name].   [birth year]  [id number]  [facility]: Lorretta Harp MD FACP, [place], [place], [place]  HPI:  [name]. is a 68 y.o. 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  His risk factors include 25 pack years of tobacco use smoking 1/2 pack/day as well as treated hypertension, diabetes and hyperlipidemia.  He is never had a heart attack or stroke.  There is no family history of heart disease.  Does complain of some dyspnea on exertion but denies chest pain.  He has had hepatitis C in the past treated at the [facility].  He has had progressive renal insufficiency now be considered for hemodialysis.   Current Meds  Medication Sig  . amLODipine (NORVASC) 10 MG tablet Take 10 mg by mouth daily.  . ASPIRIN 81 PO Take 81 mg by mouth daily.  Marland Kitchen atorvastatin (LIPITOR) 20 MG tablet Take 20 mg by mouth daily.  Marland Kitchen ibuprofen (ADVIL,MOTRIN) 800 MG tablet Take 1 tablet (800 mg total) by mouth 3 (three) times daily.  . insulin detemir (LEVEMIR) 100 UNIT/ML injection Inject 0.24 mLs (24 Units total) into the skin at bedtime.  . metoprolol tartrate (LOPRESSOR) 50 MG tablet Take 25 mg by mouth 2 (two) times daily.  . Omega-3 Fatty Acids (FISH OIL) 1000 MG CPDR Take 1,000 mg by mouth daily.  Marland Kitchen omeprazole (PRILOSEC) 20 MG capsule Take 20 mg by mouth daily.  . vitamin B-12 (CYANOCOBALAMIN) 1000 MCG tablet Take 1,000 mcg by mouth daily.  . [DISCONTINUED] HYDROcodone-acetaminophen (NORCO/VICODIN) 5-325 MG tablet Take 2 tablets by mouth every 4 (four) hours as needed.  . [DISCONTINUED] sodium bicarbonate  325 MG tablet Take 325 mg by mouth daily.     No Known Allergies  Social History   Socioeconomic History  . Marital status: Divorced    Spouse name: Not on file  . Number of children: 3  . Years of education: Not on file  . Highest education level: Not on file  Occupational History    Employer: [employer]    Comment: Worked at [employer]  . Financial resource strain: Not on file  . Food insecurity    Worry: Not on file    Inability: Not on file  . Transportation needs    Medical: Not on file    Non-medical: Not on file  Tobacco Use  . Smoking status: Current Every Day Smoker    Packs/day: 0.50    Years: 30.00    Pack years: 15.00    Types: Cigarettes  . Smokeless tobacco: Never Used  Substance and Sexual Activity  . Alcohol use: Yes    Comment: Liquor twice monthly.  . Drug use: No  . Sexual activity: Not on file  Lifestyle  . Physical activity    Days per week: Not on file    Minutes per session: Not on file  . Stress: Not on file  Relationships  . Social Herbalist on phone: Not on file    Gets together: Not on file    Attends religious service: Not on file    Active member of club or organization: Not on file  Attends meetings of clubs or organizations: Not on file    Relationship status: Not on file  . Intimate partner violence    Fear of current or ex partner: Not on file    Emotionally abused: Not on file    Physically abused: Not on file    Forced sexual activity: Not on file  Other Topics Concern  . Not on file  Social History Narrative   Financial assistance approved for 100% discount at [facility] and has [facility] card   [employer]  August 08, 2009 5:48 PM   *PATIENT WAS GIVEN DM CARD.Lela Sturdivant NT II  June 08, 2010 3:56 PM      Divorced, one daughter currently staying with him   Has 7 grand children   Reports disability and medicaid is pending     Review of Systems: General: negative for chills, fever, night sweats  or weight changes.  Cardiovascular: negative for chest pain, dyspnea on exertion, edema, orthopnea, palpitations, paroxysmal nocturnal dyspnea or shortness of breath Dermatological: negative for rash Respiratory: negative for cough or wheezing Urologic: negative for hematuria Abdominal: negative for nausea, vomiting, diarrhea, bright red blood per rectum, melena, or hematemesis Neurologic: negative for visual changes, syncope, or dizziness All other systems reviewed and are otherwise negative except as noted above.    Blood pressure (!) 148/68, pulse 74, temperature 97.7 F (36.5 C), height 5' 11.5" (1.816 m), weight 251 lb (113.9 kg).  General appearance: alert and no distress Neck: no adenopathy, no JVD, supple, symmetrical, trachea midline, thyroid not enlarged, symmetric, no tenderness/mass/nodules and Soft right carotid bruit Lungs: clear to auscultation bilaterally Heart: 2/6 outflow tract murmur consistent with aortic stenosis Extremities: extremities normal, atraumatic, no cyanosis or edema Pulses: 2+ and symmetric Skin: Skin color, texture, turgor normal. No rashes or lesions Neurologic: Alert and oriented X 3, normal strength and tone. Normal symmetric reflexes. Normal coordination and gait  EKG sinus rhythm at 74 without ST or T wave changes.  I personally reviewed this EKG.  ASSESSMENT AND PLAN:   TOBACCO ABUSE Continue tobacco abuse of 1/2 pack/day with 25 pack years of tobacco abuse  HYPERTENSION History of essential hypertension blood pressure measured today 148/68.  He is on amlodipine and metoprolol.  Hyperlipidemia LDL goal <70 History of hyperlipidemia on statin therapy followed by his PCP  Cardiac murmur Outflow tract murmur on exam.  Obtain 2D echo to further evaluate  Preoperative clearance Mr. Kasparek was referred for preoperative clearance prior to anticipated vascular access for initiation of hemodialysis in the setting of progressive renal  insufficiency.  He does have risk factors including hypertension, diabetes and hyperlipidemia as well as tobacco abuse.  He is never had a heart attack or stroke.  Does complain of some dyspnea on exertion which may be related to his long history tobacco abuse although I do hear an outflow tract murmur.  He denies chest pain.  I am going to get a 2D echo to further evaluate and if this is normal we will clear him for his upcoming procedure at low risk.      Lorretta Harp MD FACP,FACC,FAHA, [facility] 01/08/2019 9:08 AM

## 2019-01-08 NOTE — Assessment & Plan Note (Signed)
[name] was referred for preoperative clearance prior to anticipated vascular access for initiation of hemodialysis in the setting of progressive renal insufficiency.  He does have risk factors including hypertension, diabetes and hyperlipidemia as well as tobacco abuse.  He is never had a heart attack or stroke.  Does complain of some dyspnea on exertion which may be related to his long history tobacco abuse although I do hear an outflow tract murmur.  He denies chest pain.  I am going to get a 2D echo to further evaluate and if this is normal we will clear him for his upcoming procedure at low risk.

## 2019-01-08 NOTE — Assessment & Plan Note (Signed)
Continue tobacco abuse of 1/2 pack/day with 25 pack years of tobacco abuse

## 2019-01-08 NOTE — Assessment & Plan Note (Signed)
History of hyperlipidemia on statin therapy followed by his PCP 

## 2019-01-08 NOTE — Assessment & Plan Note (Signed)
Outflow tract murmur on exam.  Obtain 2D echo to further evaluate

## 2019-01-08 NOTE — Assessment & Plan Note (Signed)
History of essential hypertension blood pressure measured today 148/68.  He is on amlodipine and metoprolol.

## 2019-01-08 NOTE — Patient Instructions (Signed)
Medication Instructions:  Your physician recommends that you continue on your current medications as directed. Please refer to the Current Medication list given to you today.  If you need a refill on your cardiac medications before your next appointment, please call your pharmacy.   Lab work: NONE If you have labs (blood work) drawn today and your tests are completely normal, you will receive your results only by: Marland Kitchen MyChart Message (if you have MyChart) OR . A paper copy in the mail If you have any lab test that is abnormal or we need to change your treatment, we will call you to review the results.  Testing/Procedures: Your physician has requested that you have an echocardiogram. Echocardiography is a painless test that uses sound waves to create images of your heart. It provides your doctor with information about the size and shape of your heart and how well your heart's chambers and valves are working. This procedure takes approximately one hour. There are no restrictions for this procedure. LOCATION: [facility]: [place], [place], [place] [address] TO BE SCHEDULED  Your physician has requested that you have a carotid duplex. This test is an ultrasound of the carotid arteries in your neck. It looks at blood flow through these arteries that supply the brain with blood. Allow one hour for this exam. There are no restrictions or special instructions. TO BE SCHEDULED  Follow-Up: At [facility], you and your health needs are our priority.  As part of our continuing mission to provide you with exceptional heart care, we have created designated Provider Care Teams.  These Care Teams include your primary Cardiologist (physician) and Advanced Practice Providers (APPs -  Physician Assistants and Nurse Practitioners) who all work together to provide you with the care you need, when you need it. . You may schedule a follow up appointment AS NEEDED UNLESS YOUR RESULTS ARE  ABNORMAL. If your results are normal, you will be cleared for vascular access placement. You may see Dr. Gwenlyn Found or one of the following Advanced Practice Providers on your designated Care Team:   . Kerin Ransom, [place] . Almyra Deforest, PA-C . Fabian Sharp, PA-C . Jory Sims, DNP . Rosaria Ferries, PA-C . Roby Lofts, PA-C

## 2019-02-09 ENCOUNTER — Other Ambulatory Visit (HOSPITAL_COMMUNITY): Payer: Non-veteran care

## 2019-02-11 ENCOUNTER — Inpatient Hospital Stay (HOSPITAL_COMMUNITY)
Admission: EM | Admit: 2019-02-11 | Discharge: 2019-02-12 | DRG: 811 | Discharge status: Against Medical Advice | Payer: Medicare Other | Attending: Internal Medicine | Admitting: Internal Medicine

## 2019-02-11 ENCOUNTER — Emergency Department (HOSPITAL_COMMUNITY): Payer: Medicare Other

## 2019-02-11 ENCOUNTER — Other Ambulatory Visit: Payer: Self-pay

## 2019-02-11 ENCOUNTER — Encounter (HOSPITAL_COMMUNITY): Payer: Self-pay | Admitting: Internal Medicine

## 2019-02-11 DIAGNOSIS — N186 End stage renal disease: Secondary | ICD-10-CM | POA: Diagnosis not present

## 2019-02-11 DIAGNOSIS — G4733 Obstructive sleep apnea (adult) (pediatric): Secondary | ICD-10-CM | POA: Diagnosis present

## 2019-02-11 DIAGNOSIS — Z20828 Contact with and (suspected) exposure to other viral communicable diseases: Secondary | ICD-10-CM | POA: Diagnosis present

## 2019-02-11 DIAGNOSIS — D649 Anemia, unspecified: Secondary | ICD-10-CM | POA: Diagnosis not present

## 2019-02-11 DIAGNOSIS — E1129 Type 2 diabetes mellitus with other diabetic kidney complication: Secondary | ICD-10-CM | POA: Diagnosis present

## 2019-02-11 DIAGNOSIS — I10 Essential (primary) hypertension: Secondary | ICD-10-CM | POA: Diagnosis not present

## 2019-02-11 DIAGNOSIS — D509 Iron deficiency anemia, unspecified: Secondary | ICD-10-CM | POA: Diagnosis not present

## 2019-02-11 DIAGNOSIS — D638 Anemia in other chronic diseases classified elsewhere: Secondary | ICD-10-CM | POA: Diagnosis present

## 2019-02-11 DIAGNOSIS — E1122 Type 2 diabetes mellitus with diabetic chronic kidney disease: Secondary | ICD-10-CM | POA: Diagnosis present

## 2019-02-11 DIAGNOSIS — Z833 Family history of diabetes mellitus: Secondary | ICD-10-CM

## 2019-02-11 DIAGNOSIS — E785 Hyperlipidemia, unspecified: Secondary | ICD-10-CM | POA: Diagnosis present

## 2019-02-11 DIAGNOSIS — Z6834 Body mass index (BMI) 34.0-34.9, adult: Secondary | ICD-10-CM

## 2019-02-11 DIAGNOSIS — Z794 Long term (current) use of insulin: Secondary | ICD-10-CM

## 2019-02-11 DIAGNOSIS — C189 Malignant neoplasm of colon, unspecified: Secondary | ICD-10-CM | POA: Diagnosis present

## 2019-02-11 DIAGNOSIS — Z85038 Personal history of other malignant neoplasm of large intestine: Secondary | ICD-10-CM

## 2019-02-11 DIAGNOSIS — Z7982 Long term (current) use of aspirin: Secondary | ICD-10-CM

## 2019-02-11 DIAGNOSIS — M1712 Unilateral primary osteoarthritis, left knee: Secondary | ICD-10-CM | POA: Diagnosis present

## 2019-02-11 DIAGNOSIS — D631 Anemia in chronic kidney disease: Secondary | ICD-10-CM | POA: Diagnosis present

## 2019-02-11 DIAGNOSIS — F17210 Nicotine dependence, cigarettes, uncomplicated: Secondary | ICD-10-CM | POA: Diagnosis present

## 2019-02-11 DIAGNOSIS — Z8249 Family history of ischemic heart disease and other diseases of the circulatory system: Secondary | ICD-10-CM

## 2019-02-11 DIAGNOSIS — E669 Obesity, unspecified: Secondary | ICD-10-CM | POA: Diagnosis present

## 2019-02-11 DIAGNOSIS — I120 Hypertensive chronic kidney disease with stage 5 chronic kidney disease or end stage renal disease: Secondary | ICD-10-CM | POA: Diagnosis present

## 2019-02-11 LAB — CBC
HCT: 14.7 % — ABNORMAL LOW (ref 39.0–52.0)
Hemoglobin: 4.5 g/dL — CL (ref 13.0–17.0)
MCH: 20.1 pg — ABNORMAL LOW (ref 26.0–34.0)
MCHC: 30.6 g/dL (ref 30.0–36.0)
MCV: 65.6 fL — ABNORMAL LOW (ref 80.0–100.0)
Platelets: 200 10*3/uL (ref 150–400)
RBC: 2.24 MIL/uL — ABNORMAL LOW (ref 4.22–5.81)
RDW: 18.4 % — ABNORMAL HIGH (ref 11.5–15.5)
WBC: 7.7 10*3/uL (ref 4.0–10.5)
nRBC: 0.3 % — ABNORMAL HIGH (ref 0.0–0.2)

## 2019-02-11 LAB — IRON AND TIBC
Iron: 21 ug/dL — ABNORMAL LOW (ref 45–182)
Saturation Ratios: 5 % — ABNORMAL LOW (ref 17.9–39.5)
TIBC: 426 ug/dL (ref 250–450)
UIBC: 405 ug/dL

## 2019-02-11 LAB — PREPARE RBC (CROSSMATCH)

## 2019-02-11 LAB — COMPREHENSIVE METABOLIC PANEL
ALT: 14 U/L (ref 0–44)
AST: 21 U/L (ref 15–41)
Albumin: 3 g/dL — ABNORMAL LOW (ref 3.5–5.0)
Alkaline Phosphatase: 54 U/L (ref 38–126)
Anion gap: 8 (ref 5–15)
BUN: 32 mg/dL — ABNORMAL HIGH (ref 8–23)
CO2: 17 mmol/L — ABNORMAL LOW (ref 22–32)
Calcium: 7.8 mg/dL — ABNORMAL LOW (ref 8.9–10.3)
Chloride: 110 mmol/L (ref 98–111)
Creatinine, Ser: 6.53 mg/dL — ABNORMAL HIGH (ref 0.61–1.24)
GFR calc Af Amer: 9 mL/min — ABNORMAL LOW (ref >60)
GFR calc non Af Amer: 8 mL/min — ABNORMAL LOW (ref >60)
Glucose, Bld: 109 mg/dL — ABNORMAL HIGH (ref 70–99)
Potassium: 3.9 mmol/L (ref 3.5–5.1)
Sodium: 135 mmol/L (ref 135–145)
Total Bilirubin: 0.7 mg/dL (ref 0.3–1.2)
Total Protein: 6.8 g/dL (ref 6.5–8.1)

## 2019-02-11 LAB — FOLATE: Folate: 9.7 ng/mL (ref >5.9)

## 2019-02-11 LAB — VITAMIN B12: Vitamin B-12: 320 pg/mL (ref 180–914)

## 2019-02-11 LAB — RETICULOCYTES
Immature Retic Fract: 33.4 % — ABNORMAL HIGH (ref 2.3–15.9)
RBC.: 2.35 MIL/uL — ABNORMAL LOW (ref 4.22–5.81)
Retic Count, Absolute: 55.7 10*3/uL (ref 19.0–186.0)
Retic Ct Pct: 2.4 % (ref 0.4–3.1)

## 2019-02-11 LAB — FERRITIN: Ferritin: 5 ng/mL — ABNORMAL LOW (ref 24–336)

## 2019-02-11 IMAGING — DX DG CHEST 1V PORT
1 series · 1 of 1 positions shown · non-contrast
Comparison: [DATE]

CLINICAL DATA: Pre transfusion

EXAM:
PORTABLE CHEST 1 VIEW

[chest]
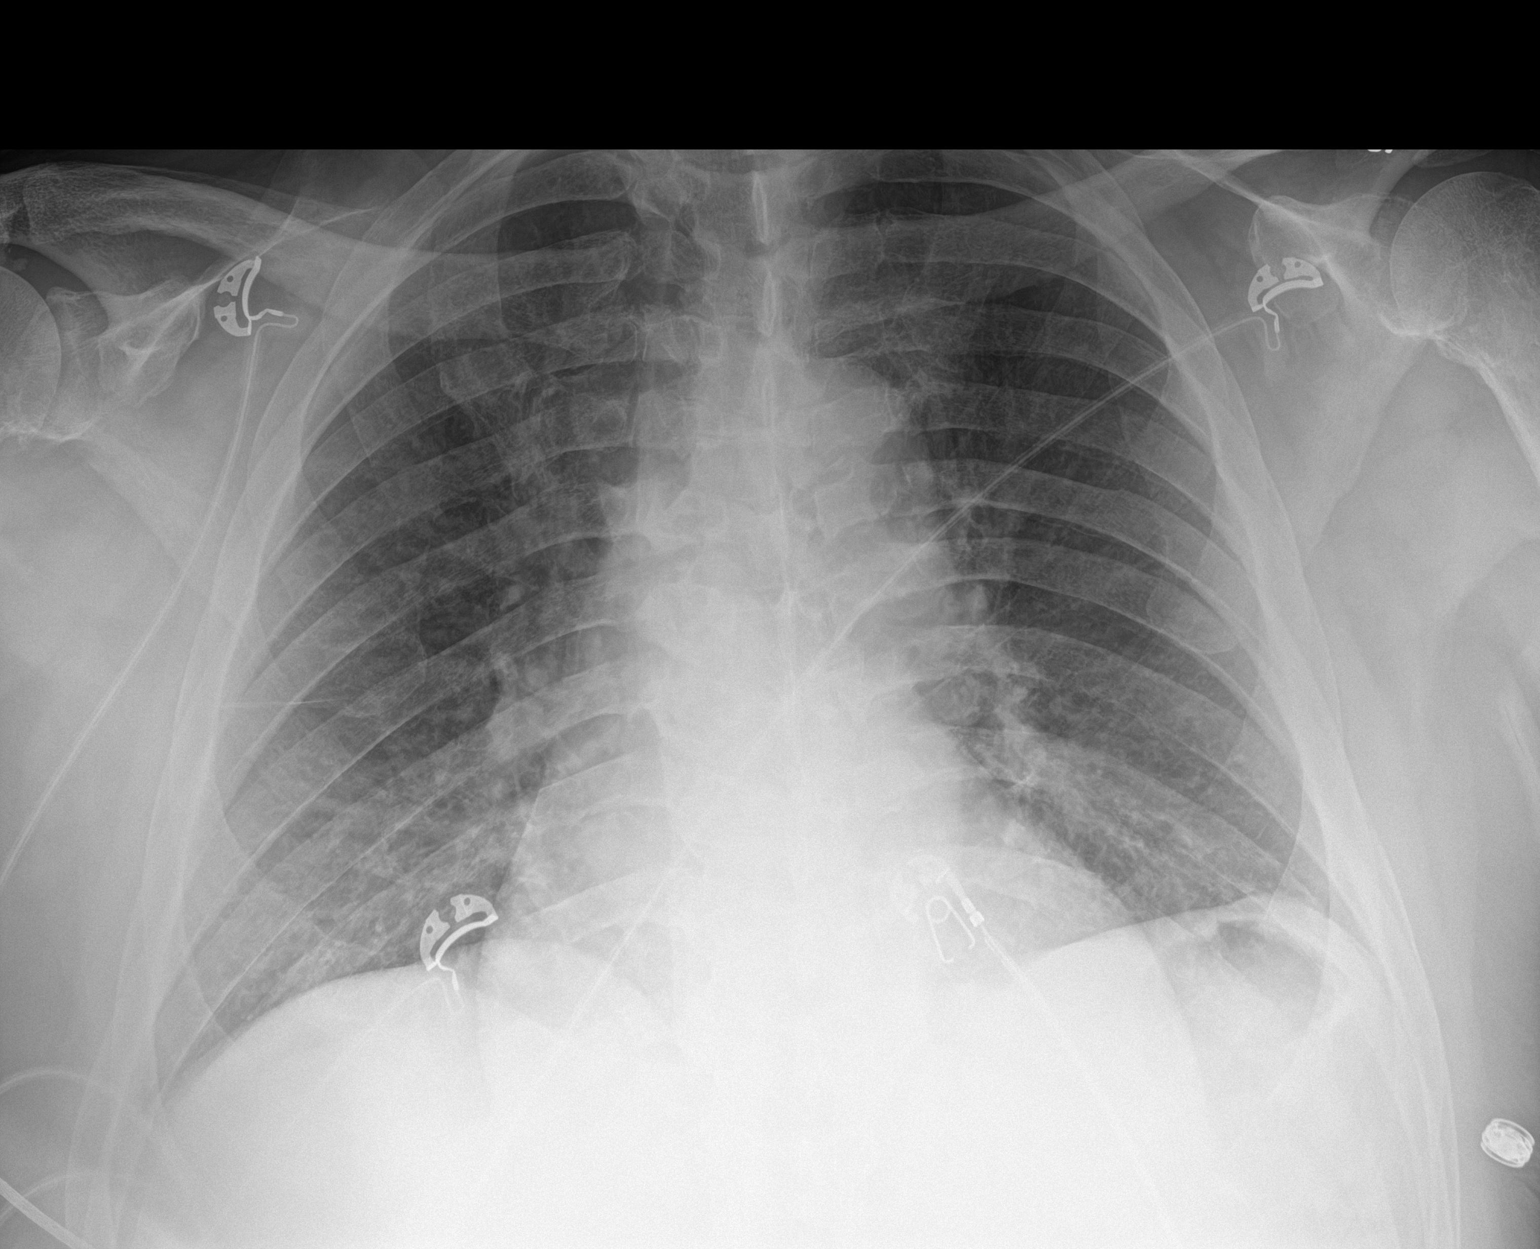

[1 of 1 positions shown; findings below may reference images not displayed]

FINDINGS: Heart is mildly enlarged. Mild vascular congestion. No overt edema.
No effusions or acute bony abnormality.
IMPRESSION: Cardiomegaly, vascular congestion.

## 2019-02-11 MED ORDER — INSULIN ASPART 100 UNIT/ML ~~LOC~~ SOLN
0.0000 [IU] | Freq: Three times a day (TID) | SUBCUTANEOUS | Status: DC
  Administered 2019-02-12: 1 [IU] via SUBCUTANEOUS
  Administered 2019-02-12: 2 [IU] via SUBCUTANEOUS

## 2019-02-11 MED ORDER — SODIUM CHLORIDE 0.9 % IV SOLN: 10.0000 mL/h | Freq: Once | INTRAVENOUS | Status: DC

## 2019-02-11 NOTE — H&P (Signed)
History and Physical    [name]. SHF:[id number] DOB: 10/17/1950 DOA: 02/11/2019  PCP: [facility], [facility]  Patient coming from: Home.  Chief Complaint: Low hemoglobin.  HPI: [name]. is a 68 y.o. male with history of colon cancer status post right hemicolectomy in 2013 at [facility], chronic kidney disease likely stage V at this time, diabetes mellitus, hypertension was advised to come to the ER the patient had routine lab work done by his primary care physician at [facility] outpatient office.  Patient states he has been having some exertional symptoms last 1 week.  But has not had any syncope palpitations.  Has not noticed any blood in the stool or black stools.  States he has received blood transfusion before.  Patient did take some Goody powder last week for his right hand joint pain.  States he does not regularly take it.  ED Course: In the ER on exam patient is hemodynamically stable hemoglobin is around 4.5 and anemia panel shows ferritin of 5 iron of 21.  ER physician has ordered 3 units of PRBC transfusion.  Patient admitted for symptomatic anemia.  Other labs show creatinine of 6.5 we do not have any old one to compare from recent past and the last one available in our system was in 2015.Marland Kitchen  COVID-19 test was negative.  Chest x-ray showing cardiomegaly.  Review of Systems: As per HPI, rest all negative.   Past Medical History:  Diagnosis Date  . Arthritis    left knee  . Chronic kidney disease    acute renal failure/injury requiring short-term HD 2013  . Colon cancer ([place])   . Colon polyp    11/2011 - Polyps identified, biopsy - invasive adenocarinoma  . DM II (diabetes mellitus, type II), controlled ([place])    type 2 IDDM x 15 years. A1C 1/09 13.7.   Marland Kitchen 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 12/25/2011  . Heart murmur   . 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 Hyperlipidemia   . Hypertension    for a few years and resolved in 2013/2014.  . Sleep apnea     does not wear CPAP now    Past Surgical History:  Procedure Laterality Date  . APPLICATION OF WOUND VAC  02/16/2012   Procedure: APPLICATION OF WOUND VAC;  Surgeon: Zenovia Jarred, MD;  Location: [facility];  Service: General;  Laterality: N/A;  . APPLICATION OF WOUND VAC  02/26/2012   Procedure: APPLICATION OF WOUND VAC;  Surgeon: Imogene Burn. [name], MD;  Location: [place];  Service: General;  Laterality: N/A;  . COLONOSCOPY    . COLONOSCOPY  02/06/2012   Procedure: COLONOSCOPY;  Surgeon: Milus Banister, MD;  Location: [place];  Service: Endoscopy;;  . COLOSTOMY REVERSAL    . DRESSING CHANGE UNDER ANESTHESIA  02/18/2012   Procedure: DRESSING CHANGE UNDER ANESTHESIA;  Surgeon: Odis Hollingshead, MD;  Location: [place];  Service: General;  Laterality: N/A;  . GANGLION CYST EXCISION  20 YRS AGO   RT ARM  . ILEOSTOMY  02/16/2012   Procedure: ILEOSTOMY;  Surgeon: Zenovia Jarred, MD;  Location: [place];  Service: General;  Laterality: N/A;  . ILEOSTOMY CLOSURE N/A 09/02/2012   Procedure: ILEOSTOMY REVERSAL;  Surgeon: Imogene Burn. [name], MD;  Location: [place];  Service: General;  Laterality: N/A;  . ILIOSTOMY    . INCISION AND DRAINAGE OF WOUND  02/23/2012   Procedure: IRRIGATION AND DEBRIDEMENT WOUND;  Surgeon: Joyice Faster. Cornett,  MD;  Location: [facility] [place];  Service: General;  Laterality: N/A;  . LAPAROTOMY  02/16/2012   Procedure: EXPLORATORY LAPAROTOMY;  Surgeon: Zenovia Jarred, MD;  Location: [facility];  Service: General;  Laterality: N/A;  Exploratory Laparotomy with resection of anastomosis  . LAPAROTOMY  02/18/2012   Procedure: EXPLORATORY LAPAROTOMY;  Surgeon: Odis Hollingshead, MD;  Location: [place];  Service: General;  Laterality: N/A;  exploratory laparotomy  change of abdominal vac dressing  . LAPAROTOMY  02/20/2012   Procedure: EXPLORATORY LAPAROTOMY;  Surgeon: Odis Hollingshead, MD;  Location: [place];  Service: General;  Laterality: N/A;  . LAPAROTOMY  02/23/2012   Procedure: EXPLORATORY LAPAROTOMY;  Surgeon:  Joyice Faster. Cornett, MD;  Location: [place];  Service: General;  Laterality: N/A;  Irrigation and Debridement of abdominal wound with wound vac change with partial closure  . LAPAROTOMY  02/26/2012   Procedure: EXPLORATORY LAPAROTOMY;  Surgeon: Imogene Burn. [name], MD;  Location: [place];  Service: General;  Laterality: N/A;     . PARTIAL COLECTOMY  02/06/2012  . PARTIAL COLECTOMY  02/06/2012   Procedure: PARTIAL COLECTOMY;  Surgeon: Imogene Burn. [name], MD;  Location: [place];  Service: General;  Laterality: N/A;  right partial colectomy  . RESECTION SMALL BOWEL / CLOSURE ILEOSTOMY  402/2014   Dr [name]  . VACUUM ASSISTED CLOSURE CHANGE  02/20/2012   Procedure: ABDOMINAL VACUUM ASSISTED CLOSURE CHANGE;  Surgeon: Odis Hollingshead, MD;  Location: [place];  Service: General;;  . VACUUM ASSISTED CLOSURE CHANGE  02/23/2012   Procedure: ABDOMINAL VACUUM ASSISTED CLOSURE CHANGE;  Surgeon: Joyice Faster. Cornett, MD;  Location: [place];  Service: General;  Laterality: N/A;     reports that he has been smoking cigarettes. He has a 15.00 pack-year smoking history. He has never used smokeless tobacco. He reports current alcohol use. He reports that he does not use drugs.  No Known Allergies  Family History  Problem Relation Age of Onset  . Diabetes Father   . Heart disease Father   . Heart disease Mother   . Diabetes Sister   . Heart disease Sister   . Colon cancer Neg Hx     Prior to Admission medications   Medication Sig Start Date End Date Taking? Authorizing Provider  amLODipine (NORVASC) 10 MG tablet Take 10 mg by mouth daily.   Yes [provider]  ASPIRIN 81 PO Take 81 mg by mouth 2 (two) times a week.    Yes [provider]  Aspirin-Salicylamide-Caffeine (BC HEADACHE POWDER PO) Take 1 packet by mouth as needed (for pain, joint discomfort/cramps, or headaches).   Yes [provider]  atorvastatin (LIPITOR) 20 MG tablet Take 20 mg by mouth daily.   Yes [provider]  ferrous  sulfate (SLOW IRON) 160 (50 Fe) MG TBCR SR tablet Take 1 tablet by mouth daily as needed (for supplementation).   Yes [provider]  insulin detemir (LEVEMIR) 100 UNIT/ML injection Inject 0.24 mLs (24 Units total) into the skin at bedtime. Patient taking differently: Inject 26 Units into the skin daily.  12/08/13  Yes Boggala, Gaynelle Adu, MD  metoprolol tartrate (LOPRESSOR) 50 MG tablet Take 25 mg by mouth daily.    Yes [provider]  Omega-3 Fatty Acids (FISH OIL) 1000 MG CPDR Take 1,000 mg by mouth every other day.    Yes [provider]  omeprazole (PRILOSEC) 20 MG capsule Take 20 mg by mouth daily.   Yes [provider]  sildenafil (VIAGRA) 100 MG tablet Take 100 mg by mouth daily as needed for erectile dysfunction (ONE HOUR PRIOR TO SEXUAL ACTIVITY).   Yes [provider]  vitamin B-12 (CYANOCOBALAMIN) 1000 MCG tablet Take 1,000 mcg by mouth daily.   Yes [provider]  ibuprofen (ADVIL,MOTRIN) 800 MG tablet Take 1 tablet (800 mg total) by mouth 3 (three) times daily. Patient not taking: Reported on 02/11/2019 07/03/15   Noemi Chapel, MD    Physical Exam: Constitutional: Moderately built and nourished. Vitals:   02/11/19 2101 02/11/19 2102 02/11/19 2120 02/11/19 2136  BP: (!) 159/72  99/74 133/75  Pulse: 69  64 67  Resp: 18  18 18   Temp: 98.9 F (37.2 C)   98.8 F (37.1 C)  TempSrc: Oral   Oral  SpO2: 99%   100%  Weight:  111.1 kg    Height:  5' 11.5" (1.816 m)     Eyes: Anicteric mild pallor. ENMT: No discharge from the ears eyes nose or mouth. Neck: No mass felt.  No neck rigidity. Respiratory: No rhonchi or crepitations. Cardiovascular: S1-S2 heard. Abdomen: Soft nontender bowel sounds present. Musculoskeletal: No edema.  No joint effusion. Skin: No rash. Neurologic: Alert awake oriented to time place and person.  Moves all extremities. Psychiatric: Appears normal.  Normal affect.   Labs on Admission: I have  personally reviewed following labs and imaging studies  CBC: Recent Labs  Lab 02/11/19 1721  WBC 7.7  HGB 4.5*  HCT 14.7*  MCV 65.6*  PLT 923   Basic Metabolic Panel: Recent Labs  Lab 02/11/19 1721  NA 135  K 3.9  CL 110  CO2 17*  GLUCOSE 109*  BUN 32*  CREATININE 6.53*  CALCIUM 7.8*   GFR: Estimated Creatinine Clearance: 14 mL/min (A) (by C-G formula based on SCr of 6.53 mg/dL (H)). Liver Function Tests: Recent Labs  Lab 02/11/19 1721  AST 21  ALT 14  ALKPHOS 54  BILITOT 0.7  PROT 6.8  ALBUMIN 3.0*   No results for input(s): LIPASE, AMYLASE in the last 168 hours. No results for input(s): AMMONIA in the last 168 hours. Coagulation Profile: No results for input(s): INR, PROTIME in the last 168 hours. Cardiac Enzymes: No results for input(s): CKTOTAL, CKMB, CKMBINDEX, TROPONINI in the last 168 hours. BNP (last 3 results) No results for input(s): PROBNP in the last 8760 hours. HbA1C: No results for input(s): HGBA1C in the last 72 hours. CBG: No results for input(s): GLUCAP in the last 168 hours. Lipid Profile: No results for input(s): CHOL, HDL, LDLCALC, TRIG, CHOLHDL, LDLDIRECT in the last 72 hours. Thyroid Function Tests: No results for input(s): TSH, T4TOTAL, FREET4, T3FREE, THYROIDAB in the last 72 hours. Anemia Panel: Recent Labs    02/11/19 2011  VITAMINB12 320  FOLATE 9.7  FERRITIN 5*  TIBC 426  IRON 21*  RETICCTPCT 2.4   Urine analysis:    Component Value Date/Time   COLORURINE YELLOW 09/15/2013 1059   APPEARANCEUR CLEAR 09/15/2013 1059   LABSPEC 1.035 (H) 09/15/2013 1059   PHURINE 6.0 09/15/2013 1059   GLUCOSEU > 1000 (A) 09/15/2013 1059   GLUCOSEU NEG mg/dL 12/05/2009 1042   HGBUR TRACE (A) 09/15/2013 1059   BILIRUBINUR NEGATIVE 09/15/2013 1059   KETONESUR NEGATIVE 09/15/2013 1059   PROTEINUR 100 (A) 09/15/2013 1059   UROBILINOGEN 0.2 09/15/2013 1059   NITRITE NEG 09/15/2013 1059   LEUKOCYTESUR NEGATIVE 09/15/2013 1059   Sepsis  Labs: @LABRCNTIP (procalcitonin:4,lacticidven:4) )No results found for this or any previous visit (  from the past 240 hour(s)).   Radiological Exams on Admission: Dg Chest Port 1 View  Result Date: 02/11/2019 CLINICAL DATA:  Pre transfusion EXAM: PORTABLE CHEST 1 VIEW COMPARISON:  07/03/2015 FINDINGS: Heart is mildly enlarged. Mild vascular congestion. No overt edema. No effusions or acute bony abnormality. IMPRESSION: Cardiomegaly, vascular congestion. Electronically Signed   By: Rolm Baptise M.D.   On: 02/11/2019 20:31     Assessment/Plan Principal Problem:   Symptomatic anemia Active Problems:   OBSTRUCTIVE SLEEP APNEA   Colon cancer, status Post resection    HYPERTENSION   Type 2 diabetes mellitus with renal complication (HCC)    1. Symptomatic anemia with microcytic hypochromic picture with iron deficiency and patient is already on iron supplements.  I have ordered stool for occult blood.  3 units of PRBC transfusion has been ordered.  Given that patient has iron deficiency with history of colon cancer and patient stating that he has not had any follow-up previously will need GI consult in the morning.  Probably will have to check CEA with next blood draw.  Anemia could also be from renal failure. 2. Chronic kidney disease stage V -[facility]  Will need to get labs from them in the morning.  The labs available in our system was in 2015. 3. Hypertension on amlodipine and metoprolol. 4. Diabetes mellitus type 2 on Levemir I have decreased the Levemir dose by 5 units.  On sliding scale coverage. 5. History of colon cancer status post right hemicolectomy in 2013.  See #1 regarding further work-up. 6. Hyperlipidemia on statins.   DVT prophylaxis: SCDs. Code Status: Full code. Family Communication: Discussed with patient. Disposition Plan: Home. Consults called: None. Admission status: Observation.   Rise Patience MD [facility] Pager [phone].   If 7PM-7AM, please contact night-coverage [email or web address] Password [facility]  02/11/2019, 10:39 PM

## 2019-02-11 NOTE — ED Notes (Signed)
Hgb 4.5

## 2019-02-11 NOTE — ED Provider Notes (Signed)
[facility] EMERGENCY DEPARTMENT Provider Note   CSN: [id number] Arrival date & time: 02/11/19  1629     History   Chief Complaint Chief Complaint  Patient presents with  . Abnormal Lab    HPI [name]. is a 68 y.o. male.     HPI  68 year old male comes in a chief complaint of low hemoglobin.  He has history of diabetes, ESRD, getting worked up for hemodialysis and prior history of transfusion late last year.  He reports that he went to the [place] for his kidney issues and was called today with low hemoglobin.  Patient has had episodes of shortness of breath with exertion and chest discomfort with exertion.  He denies any bloody stools, abdominal pain, melanotic stools.  He reports that he received 2 units of PRBCs late last year or right before the pandemic at [facility].  He was told that the anemia was because of his kidneys.  However, we do not see any records of patient getting transfused at [facility] health.  Past Medical History:  Diagnosis Date  . Arthritis    left knee  . Chronic kidney disease    acute renal failure/injury requiring short-term HD 2013  . Colon cancer ([place])   . Colon polyp    11/2011 - Polyps identified, biopsy - invasive adenocarinoma  . DM II (diabetes mellitus, type II), controlled ([place])    type 2 IDDM x 15 years. A1C 1/09 13.7.   Marland Kitchen 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 12/25/2011  . Heart murmur   . 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 Hyperlipidemia   . Hypertension    for a few years and resolved in 2013/2014.  . Sleep apnea    does not wear CPAP now    Patient Active Problem List   Diagnosis Date Noted  . Cardiac murmur 01/08/2019  . Preoperative clearance 01/08/2019  . Uncontrolled Type 2 DM 09/15/2013  . HYPERTENSION 03/08/2013  . CKD  06/24/2012  . Colon cancer, status Post resection  05/11/2012  . Health care maintenance 11/27/2011  . Obesity (BMI 30.0-34.9) 10/20/2011  . Cataract, bilateral 08/29/2011  .  Hyperlipidemia LDL goal <70 04/14/2006  . MICROCYTIC ANEMIA 04/14/2006  . TOBACCO ABUSE 04/14/2006  . OBSTRUCTIVE SLEEP APNEA 04/14/2006    Past Surgical History:  Procedure Laterality Date  . APPLICATION OF WOUND VAC  02/16/2012   Procedure: APPLICATION OF WOUND VAC;  Surgeon: Zenovia Jarred, MD;  Location: [facility];  Service: General;  Laterality: N/A;  . APPLICATION OF WOUND VAC  02/26/2012   Procedure: APPLICATION OF WOUND VAC;  Surgeon: Imogene Burn. [name], MD;  Location: [facility];  Service: General;  Laterality: N/A;  . COLONOSCOPY    . COLONOSCOPY  02/06/2012   Procedure: COLONOSCOPY;  Surgeon: Milus Banister, MD;  Location: [place];  Service: Endoscopy;;  . COLOSTOMY REVERSAL    . DRESSING CHANGE UNDER ANESTHESIA  02/18/2012   Procedure: DRESSING CHANGE UNDER ANESTHESIA;  Surgeon: Odis Hollingshead, MD;  Location: [place];  Service: General;  Laterality: N/A;  . GANGLION CYST EXCISION  20 YRS AGO   RT ARM  . ILEOSTOMY  02/16/2012   Procedure: ILEOSTOMY;  Surgeon: Zenovia Jarred, MD;  Location: [facility];  Service: General;  Laterality: N/A;  . ILEOSTOMY CLOSURE N/A 09/02/2012   Procedure: ILEOSTOMY REVERSAL;  Surgeon: Imogene Burn. [name], MD;  Location: [facility];  Service: General;  Laterality: N/A;  . ILIOSTOMY    .  INCISION AND DRAINAGE OF WOUND  02/23/2012   Procedure: IRRIGATION AND DEBRIDEMENT WOUND;  Surgeon: Joyice Faster. Cornett, MD;  Location: [facility];  Service: General;  Laterality: N/A;  . LAPAROTOMY  02/16/2012   Procedure: EXPLORATORY LAPAROTOMY;  Surgeon: Zenovia Jarred, MD;  [facility];  Service: General;  Laterality: N/A;  Exploratory Laparotomy with resection of anastomosis  . LAPAROTOMY  02/18/2012   Procedure: EXPLORATORY LAPAROTOMY;  Surgeon: Odis Hollingshead, MD;  Location: [place];  Service: General;  Laterality: N/A;  exploratory laparotomy  change of abdominal vac dressing  . LAPAROTOMY  02/20/2012   Procedure: EXPLORATORY LAPAROTOMY;  Surgeon: Odis Hollingshead, MD;  Location: [place];  Service: General;  Laterality: N/A;  . LAPAROTOMY  02/23/2012   Procedure: EXPLORATORY LAPAROTOMY;  Surgeon: Joyice Faster. Cornett, MD;  Location: [place];  Service: General;  Laterality: N/A;  Irrigation and Debridement of abdominal wound with wound vac change with partial closure  . LAPAROTOMY  02/26/2012   Procedure: EXPLORATORY LAPAROTOMY;  Surgeon: Imogene Burn. [name], MD;  Location: [facility];  Service: General;  Laterality: N/A;     . PARTIAL COLECTOMY  02/06/2012  . PARTIAL COLECTOMY  02/06/2012   Procedure: PARTIAL COLECTOMY;  Surgeon: Imogene Burn. [name], MD;  Location: [place];  Service: General;  Laterality: N/A;  right partial colectomy  . RESECTION SMALL BOWEL / CLOSURE ILEOSTOMY  402/2014   Dr [name]  . VACUUM ASSISTED CLOSURE CHANGE  02/20/2012   Procedure: ABDOMINAL VACUUM ASSISTED CLOSURE CHANGE;  Surgeon: Odis Hollingshead, MD;  Location: [place];  Service: General;;  . VACUUM ASSISTED CLOSURE CHANGE  02/23/2012   Procedure: ABDOMINAL VACUUM ASSISTED CLOSURE CHANGE;  Surgeon: Joyice Faster. Cornett, MD;  Location: [place];  Service: General;  Laterality: N/A;        Home Medications    Prior to Admission medications   Medication Sig Start Date End Date Taking? Authorizing Provider  amLODipine (NORVASC) 10 MG tablet Take 10 mg by mouth daily.    [provider]  ASPIRIN 81 PO Take 81 mg by mouth daily.    [provider]  atorvastatin (LIPITOR) 20 MG tablet Take 20 mg by mouth daily.    [provider]  ibuprofen (ADVIL,MOTRIN) 800 MG tablet Take 1 tablet (800 mg total) by mouth 3 (three) times daily. 07/03/15   Noemi Chapel, MD  insulin detemir (LEVEMIR) 100 UNIT/ML injection Inject 0.24 mLs (24 Units total) into the skin at bedtime. 12/08/13   Malena Catholic, MD  metoprolol tartrate (LOPRESSOR) 50 MG tablet Take 25 mg by mouth 2 (two) times daily.    [provider]  Omega-3 Fatty Acids (FISH OIL) 1000 MG CPDR Take 1,000 mg by mouth daily.    [provider]  omeprazole (PRILOSEC) 20 MG capsule Take 20 mg by mouth daily.    [provider]  vitamin B-12 (CYANOCOBALAMIN) 1000 MCG tablet Take 1,000 mcg by mouth daily.    [provider]    Family History Family History  Problem Relation Age of Onset  . Diabetes Father   . Heart disease Father   . Heart disease Mother   . Diabetes Sister   . Heart disease Sister   . Colon cancer Neg Hx     Social History Social History   Tobacco Use  . Smoking status: Current Every Day Smoker    Packs/day: 0.50    Years: 30.00    Pack years: 15.00  Types: Cigarettes  . Smokeless tobacco: Never Used  Substance Use Topics  . Alcohol use: Yes    Comment: Liquor twice monthly.  . Drug use: No     Allergies   Patient has no known allergies.   Review of Systems Review of Systems  Constitutional: Negative for activity change.  Respiratory: Positive for shortness of breath.   Cardiovascular: Positive for chest pain.  Gastrointestinal: Negative for nausea and vomiting.  Neurological: Negative for syncope.  Hematological: Does not bruise/bleed easily.  All other systems reviewed and are negative.    Physical Exam Updated Vital Signs BP (!) 156/57 (BP Location: Left Arm)   Pulse 66   Temp 99.1 F (37.3 C) (Oral)   Resp 16   SpO2 100%   Physical Exam Vitals signs and nursing note reviewed.  Constitutional:      Appearance: He is well-developed.  HENT:     Head: Normocephalic and atraumatic.  Eyes:     Conjunctiva/sclera: Conjunctivae normal.     Pupils: Pupils are equal, round, and reactive to light.  Neck:     Musculoskeletal: Normal range of motion and neck supple.  Cardiovascular:     Rate and Rhythm: Normal rate and regular rhythm.  Pulmonary:     Effort: Pulmonary effort is normal.     Breath sounds: Normal breath sounds.  Abdominal:     General: Bowel sounds are normal.     Palpations: Abdomen is soft.  Skin:    General: Skin is warm.   Neurological:     Mental Status: He is alert and oriented to person, place, and time.      ED Treatments / Results  Labs (all labs ordered are listed, but only abnormal results are displayed) Labs Reviewed  COMPREHENSIVE METABOLIC PANEL - Abnormal; Notable for the following components:      Result Value   CO2 17 (*)    Glucose, Bld 109 (*)    BUN 32 (*)    Creatinine, Ser 6.53 (*)    Calcium 7.8 (*)    Albumin 3.0 (*)    GFR calc non Af Amer 8 (*)    GFR calc Af Amer 9 (*)    All other components within normal limits  CBC - Abnormal; Notable for the following components:   RBC 2.24 (*)    Hemoglobin 4.5 (*)    HCT 14.7 (*)    MCV 65.6 (*)    MCH 20.1 (*)    RDW 18.4 (*)    nRBC 0.3 (*)    All other components within normal limits  SARS CORONAVIRUS 2 (TAT 6-24 HRS)  VITAMIN B12  FOLATE  IRON AND TIBC  FERRITIN  RETICULOCYTES  POC OCCULT BLOOD, ED  TYPE AND SCREEN  PREPARE RBC (CROSSMATCH)    EKG None  Radiology No results found.  Procedures .Critical Care Performed by: Varney Biles, MD Authorized by: Varney Biles, MD   Critical care provider statement:    Critical care time (minutes):  32   Critical care time was exclusive of:  Separately billable procedures and treating other patients   Critical care was necessary to treat or prevent imminent or life-threatening deterioration of the following conditions: Symptomatic anemia.   Critical care was time spent personally by me on the following activities:  Discussions with consultants, evaluation of patient's response to treatment, examination of patient, ordering and performing treatments and interventions, ordering and review of laboratory studies, ordering and review of radiographic studies, pulse oximetry, re-evaluation  of patient's condition, obtaining history from patient or surrogate and review of old charts   (including critical care time)  Medications Ordered in ED Medications  0.9 %  sodium  chloride infusion (has no administration in time range)     Initial Impression / Assessment and Plan / ED Course  I have reviewed the triage vital signs and the nursing notes.  Pertinent labs & imaging results that were available during my care of the patient were reviewed by me and considered in my medical decision making (see chart for details).        68 year old male comes in a chief complaint of low hemoglobin.  Patient has symptomatic anemia with hemoglobin at 4.5.  He denies any bloody stools, melena, abdominal pain.  He has had blood transfusion in the last year, it was not in our system although that is what patient is suggesting.  We will start transfusion.  Suspicion is that the anemia is because of his kidney failure and chronic in nature versus acute GI loss.  Medicine will need to admit the patient.  He will likely need diuresing in between and he is making urine.  Final Clinical Impressions(s) / ED Diagnoses   Final diagnoses:  Symptomatic anemia  ESRD (end stage renal disease) [facility])    ED Discharge Orders    None       Varney Biles, MD 02/11/19 2022

## 2019-02-11 NOTE — ED Notes (Signed)
ED TO INPATIENT HANDOFF REPORT  ED Nurse Name and Phone #:   Lenice Pressman [phone]  S Name/Age/Gender [name]. 68 y.o. male Room/Bed: 020C/020C  Code Status   Code Status: Prior  Home/SNF/Other Home Patient oriented to: self, place, time and situation Is this baseline? Yes   Triage Complete: Triage complete  Chief Complaint labs abnormal  Triage Note Patient sent over by [facility] hospital for abnormal labs - had blood work done today and was told his hgb was 4.4, hct 14.1. States this has happened before and he's had to receive blood transfusions. Denies any symptoms, no dark or bloody stools, shortness of breath, or dizziness.    Allergies No Known Allergies  Level of Care/Admitting Diagnosis ED Disposition    ED Disposition Condition Comment   Admit  Hospital Area: [facility] [[id number]]  Level of Care: Telemetry Medical [104]  I expect the patient will be discharged within 24 hours: No (not a candidate for 5C-Observation unit)  Covid Evaluation: Asymptomatic Screening Protocol (No Symptoms)  Diagnosis: Symptomatic anemia [8127517]  Admitting Physician: Rise Patience [phone]  Attending Physician: Rise Patience Lei.Right  PT Class (Do Not Modify): Observation [104]  PT Acc Code (Do Not Modify): Observation [10022]       B Medical/Surgery History Past Medical History:  Diagnosis Date  . Arthritis    left knee  . Chronic kidney disease    acute renal failure/injury requiring short-term HD 2013  . Colon cancer ([place])   . Colon polyp    11/2011 - Polyps identified, biopsy - invasive adenocarinoma  . DM II (diabetes mellitus, type II), controlled ([place])    type 2 IDDM x 15 years. A1C 1/09 13.7.   Marland Kitchen 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 12/25/2011  . Heart murmur   . 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 Hyperlipidemia   . Hypertension    for a few years and resolved in 2013/2014.  . Sleep apnea    does not wear CPAP now   Past  Surgical History:  Procedure Laterality Date  . APPLICATION OF WOUND VAC  02/16/2012   Procedure: APPLICATION OF WOUND VAC;  Surgeon: Zenovia Jarred, MD;  Location: [place];  Service: General;  Laterality: N/A;  . APPLICATION OF WOUND VAC  02/26/2012   Procedure: APPLICATION OF WOUND VAC;  Surgeon: Imogene Burn. [name], MD;  Location: [place];  Service: General;  Laterality: N/A;  . COLONOSCOPY    . COLONOSCOPY  02/06/2012   Procedure: COLONOSCOPY;  Surgeon: Milus Banister, MD;  Location: [place];  Service: Endoscopy;;  . COLOSTOMY REVERSAL    . DRESSING CHANGE UNDER ANESTHESIA  02/18/2012   Procedure: DRESSING CHANGE UNDER ANESTHESIA;  Surgeon: Odis Hollingshead, MD;  Location: [place];  Service: General;  Laterality: N/A;  . GANGLION CYST EXCISION  20 YRS AGO   RT ARM  . ILEOSTOMY  02/16/2012   Procedure: ILEOSTOMY;  Surgeon: Zenovia Jarred, MD;  Location: [place];  Service: General;  Laterality: N/A;  . ILEOSTOMY CLOSURE N/A 09/02/2012   Procedure: ILEOSTOMY REVERSAL;  Surgeon: Imogene Burn. [name], MD;  Location: [place];  Service: General;  Laterality: N/A;  . ILIOSTOMY    . INCISION AND DRAINAGE OF WOUND  02/23/2012   Procedure: IRRIGATION AND DEBRIDEMENT WOUND;  Surgeon: Joyice Faster. Cornett, MD;  Location: [place];  Service: General;  Laterality: N/A;  . LAPAROTOMY  02/16/2012   Procedure: EXPLORATORY LAPAROTOMY;  Surgeon: Merri Ray  Grandville Silos, MD;  Location: [place];  Service: General;  Laterality: N/A;  Exploratory Laparotomy with resection of anastomosis  . LAPAROTOMY  02/18/2012   Procedure: EXPLORATORY LAPAROTOMY;  Surgeon: Odis Hollingshead, MD;  Location: [place];  Service: General;  Laterality: N/A;  exploratory laparotomy  change of abdominal vac dressing  . LAPAROTOMY  02/20/2012   Procedure: EXPLORATORY LAPAROTOMY;  Surgeon: Odis Hollingshead, MD;  Location: [place];  Service: General;  Laterality: N/A;  . LAPAROTOMY  02/23/2012   Procedure: EXPLORATORY LAPAROTOMY;  Surgeon: Joyice Faster. Cornett, MD;  Location: [place];  Service: General;  Laterality: N/A;  Irrigation and Debridement of abdominal wound with wound vac change with partial closure  . LAPAROTOMY  02/26/2012   Procedure: EXPLORATORY LAPAROTOMY;  Surgeon: Imogene Burn. [name], MD;  Location: [place];  Service: General;  Laterality: N/A;     . PARTIAL COLECTOMY  02/06/2012  . PARTIAL COLECTOMY  02/06/2012   Procedure: PARTIAL COLECTOMY;  Surgeon: Imogene Burn. [name], MD;  Location: [place];  Service: General;  Laterality: N/A;  right partial colectomy  . RESECTION SMALL BOWEL / CLOSURE ILEOSTOMY  402/2014   Dr [name]  . VACUUM ASSISTED CLOSURE CHANGE  02/20/2012   Procedure: ABDOMINAL VACUUM ASSISTED CLOSURE CHANGE;  Surgeon: Odis Hollingshead, MD;  Location: [place];  Service: General;;  . VACUUM ASSISTED CLOSURE CHANGE  02/23/2012   Procedure: ABDOMINAL VACUUM ASSISTED CLOSURE CHANGE;  Surgeon: Joyice Faster. Cornett, MD;  Location: [place];  Service: General;  Laterality: N/A;     A IV Location/Drains/Wounds Patient Lines/Drains/Airways Status   Active Line/Drains/Airways    Name:   Placement date:   Placement time:   Site:   Days:   Peripheral IV 04/03/13 Right Forearm   04/03/13    1110    Forearm   2140   Peripheral IV 02/11/19 Left Hand   02/11/19    1942    Hand   less than 1   Incision 09/02/12 Abdomen Other (Comment);Mid   09/02/12    0828     2353   Incision - 1 Port Abdomen 1: Right;Lower   09/02/12    -     2353   Wound 03/05/12 Laceration Elbow Right 1/2 inch   03/05/12    2145    Elbow   2534   Wound 03/31/12 Other (Comment) Abdomen Mid surgical wound midline    03/31/12    2000    Abdomen   2508          Intake/Output Last 24 hours No intake or output data in the 24 hours ending 02/11/19 2238  Labs/Imaging Results for orders placed or performed during the hospital encounter of 02/11/19 (from the past 48 hour(s))  Type and screen [place]     Status: None (Preliminary result)   Collection Time: 02/11/19  5:20 PM  Result  Value Ref Range   ABO/RH(D) AB POS    Antibody Screen NEG    Sample Expiration 02/14/2019,2359    Unit Number [id number]    Blood Component Type RED CELLS,LR    Unit division 00    Status of Unit ISSUED    Transfusion Status OK TO TRANSFUSE    Crossmatch Result      Compatible Performed at [facility], [address], [place], [place] [address]    Unit Number [id number]    Blood Component Type RED CELLS,LR    Unit division 00  Status of Unit ALLOCATED    Transfusion Status OK TO TRANSFUSE    Crossmatch Result Compatible    Unit Number [id number]    Blood Component Type RBC LR PHER1    Unit division 00    Status of Unit ALLOCATED    Transfusion Status OK TO TRANSFUSE    Crossmatch Result Compatible   Comprehensive metabolic panel     Status: Abnormal   Collection Time: 02/11/19  5:21 PM  Result Value Ref Range   Sodium 135 135 - 145 mmol/L   Potassium 3.9 3.5 - 5.1 mmol/L   Chloride 110 98 - 111 mmol/L   CO2 17 (L) 22 - 32 mmol/L   Glucose, Bld 109 (H) 70 - 99 mg/dL   BUN 32 (H) 8 - 23 mg/dL   Creatinine, Ser 6.53 (H) 0.61 - 1.24 mg/dL   Calcium 7.8 (L) 8.9 - 10.3 mg/dL   Total Protein 6.8 6.5 - 8.1 g/dL   Albumin 3.0 (L) 3.5 - 5.0 g/dL   AST 21 15 - 41 U/L   ALT 14 0 - 44 U/L   Alkaline Phosphatase 54 38 - 126 U/L   Total Bilirubin 0.7 0.3 - 1.2 mg/dL   GFR calc non Af Amer 8 (L) >60 mL/min   GFR calc Af Amer 9 (L) >60 mL/min   Anion gap 8 5 - 15    Comment: Performed at [facility], [address], [place], [place] [address]  CBC     Status: Abnormal   Collection Time: 02/11/19  5:21 PM  Result Value Ref Range   WBC 7.7 4.0 - 10.5 K/uL   RBC 2.24 (L) 4.22 - 5.81 MIL/uL   Hemoglobin 4.5 (LL) 13.0 - 17.0 g/dL    Comment: REPEATED TO VERIFY Reticulocyte Hemoglobin testing may be clinically indicated, consider ordering this additional test [id number] THIS CRITICAL RESULT HAS VERIFIED AND BEEN CALLED TO S.COOK RN BY KATHERINE MCCORMICK ON 09  10 2020 AT 1806, AND HAS BEEN READ BACK.     HCT 14.7 (L) 39.0 - 52.0 %   MCV 65.6 (L) 80.0 - 100.0 fL   MCH 20.1 (L) 26.0 - 34.0 pg   MCHC 30.6 30.0 - 36.0 g/dL   RDW 18.4 (H) 11.5 - 15.5 %   Platelets 200 150 - 400 K/uL    Comment: REPEATED TO VERIFY   nRBC 0.3 (H) 0.0 - 0.2 %    Comment: Performed at [facility] [address], [place], [place] [address]  Prepare RBC     Status: None   Collection Time: 02/11/19  8:05 PM  Result Value Ref Range   Order Confirmation      ORDER PROCESSED BY BLOOD BANK Performed at [facility], [place] [address], [place], [place] [address]   Vitamin B12     Status: None   Collection Time: 02/11/19  8:11 PM  Result Value Ref Range   Vitamin B-12 320 180 - 914 pg/mL    Comment: (NOTE) This assay is not validated for testing neonatal or myeloproliferative syndrome specimens for Vitamin B12 levels. Performed at [facility], [place] [address], [place],  [address]   Folate     Status: None   Collection Time: 02/11/19  8:11 PM  Result Value Ref Range   Folate 9.7 >5.9 ng/mL    Comment: Performed at [facility] [address], [place], [place] [address]  Iron and TIBC  Status: Abnormal   Collection Time: 02/11/19  8:11 PM  Result Value Ref Range   Iron 21 (L) 45 - 182 ug/dL   TIBC 426 250 - 450 ug/dL   Saturation Ratios 5 (L) 17.9 - 39.5 %   UIBC 405 ug/dL    Comment: Performed at [facility] [address], [place], [place] [address]  Ferritin     Status: Abnormal   Collection Time: 02/11/19  8:11 PM  Result Value Ref Range   Ferritin 5 (L) 24 - 336 ng/mL    Comment: Performed at [facility] [address] [place], [place] [address]  Reticulocytes     Status: Abnormal   Collection Time: 02/11/19  8:11 PM  Result Value Ref Range   Retic Ct Pct 2.4 0.4 - 3.1 %   RBC. 2.35 (L) 4.22 - 5.81 MIL/uL   Retic Count, Absolute 55.7 19.0 - 186.0 K/uL   Immature Retic Fract 33.4 (H) 2.3 - 15.9 %     Comment: Performed at [facility] [address], [place], [place] [address]   Dg Chest Port 1 View  Result Date: 02/11/2019 CLINICAL DATA:  Pre transfusion EXAM: PORTABLE CHEST 1 VIEW COMPARISON:  07/03/2015 FINDINGS: Heart is mildly enlarged. Mild vascular congestion. No overt edema. No effusions or acute bony abnormality. IMPRESSION: Cardiomegaly, vascular congestion. Electronically Signed   By: Rolm Baptise M.D.   On: 02/11/2019 20:31    Pending Labs Unresulted Labs (From admission, onward)    Start     Ordered   02/11/19 2010  SARS CORONAVIRUS 2 (TAT 6-24 HRS) Nasopharyngeal Nasopharyngeal Swab  (Asymptomatic/Tier 2 Patients Labs)  Once,   STAT    Question Answer Comment  Is this test for diagnosis or screening Screening   Symptomatic for COVID-19 as defined by CDC No   Hospitalized for COVID-19 No   Admitted to ICU for COVID-19 No   Previously tested for COVID-19 No   Resident in a congregate (group) care setting No   Employed in healthcare setting No      02/11/19 2009   Signed and Held  HIV antibody (Routine Testing)  Tomorrow morning,   R     Signed and Held   Signed and Held  Basic metabolic panel  Tomorrow morning,   R     Signed and Held   Signed and Held  CBC  Tomorrow morning,   R     Signed and Held          Vitals/Pain Today's Vitals   02/11/19 2101 02/11/19 2102 02/11/19 2120 02/11/19 2136  BP: (!) 159/72  99/74 133/75  Pulse: 69  64 67  Resp: 18  18 18   Temp: 98.9 F (37.2 C)   98.8 F (37.1 C)  TempSrc: Oral   Oral  SpO2: 99%   100%  Weight:  111.1 kg    Height:  5' 11.5" (1.816 m)    PainSc: 0-No pain       Isolation Precautions No active isolations  Medications Medications  0.9 %  sodium chloride infusion (has no administration in time range)    Mobility walks Low fall risk   Focused Assessments    R Recommendations: See Admitting Provider Note  Report given to:   Additional Notes:

## 2019-02-11 NOTE — ED Triage Notes (Signed)
Patient sent over by [facility] hospital for abnormal labs - had blood work done today and was told his hgb was 4.4, hct 14.1. States this has happened before and he's had to receive blood transfusions. Denies any symptoms, no dark or bloody stools, shortness of breath, or dizziness.

## 2019-02-12 ENCOUNTER — Ambulatory Visit (HOSPITAL_COMMUNITY)
Admission: RE | Admit: 2019-02-12 | Payer: Medicare Other | Source: Ambulatory Visit | Attending: Cardiovascular Disease | Admitting: Cardiovascular Disease

## 2019-02-12 DIAGNOSIS — Z794 Long term (current) use of insulin: Secondary | ICD-10-CM | POA: Diagnosis not present

## 2019-02-12 DIAGNOSIS — F17210 Nicotine dependence, cigarettes, uncomplicated: Secondary | ICD-10-CM | POA: Diagnosis present

## 2019-02-12 DIAGNOSIS — D649 Anemia, unspecified: Secondary | ICD-10-CM

## 2019-02-12 DIAGNOSIS — G4733 Obstructive sleep apnea (adult) (pediatric): Secondary | ICD-10-CM | POA: Diagnosis present

## 2019-02-12 DIAGNOSIS — E1122 Type 2 diabetes mellitus with diabetic chronic kidney disease: Secondary | ICD-10-CM | POA: Diagnosis present

## 2019-02-12 DIAGNOSIS — Z833 Family history of diabetes mellitus: Secondary | ICD-10-CM | POA: Diagnosis not present

## 2019-02-12 DIAGNOSIS — Z7982 Long term (current) use of aspirin: Secondary | ICD-10-CM | POA: Diagnosis not present

## 2019-02-12 DIAGNOSIS — E785 Hyperlipidemia, unspecified: Secondary | ICD-10-CM | POA: Diagnosis present

## 2019-02-12 DIAGNOSIS — E669 Obesity, unspecified: Secondary | ICD-10-CM | POA: Diagnosis present

## 2019-02-12 DIAGNOSIS — Z8249 Family history of ischemic heart disease and other diseases of the circulatory system: Secondary | ICD-10-CM | POA: Diagnosis not present

## 2019-02-12 DIAGNOSIS — Z20828 Contact with and (suspected) exposure to other viral communicable diseases: Secondary | ICD-10-CM | POA: Diagnosis present

## 2019-02-12 DIAGNOSIS — D631 Anemia in chronic kidney disease: Secondary | ICD-10-CM | POA: Diagnosis present

## 2019-02-12 DIAGNOSIS — D509 Iron deficiency anemia, unspecified: Secondary | ICD-10-CM | POA: Diagnosis present

## 2019-02-12 DIAGNOSIS — Z85038 Personal history of other malignant neoplasm of large intestine: Secondary | ICD-10-CM | POA: Diagnosis not present

## 2019-02-12 DIAGNOSIS — M1712 Unilateral primary osteoarthritis, left knee: Secondary | ICD-10-CM | POA: Diagnosis present

## 2019-02-12 DIAGNOSIS — Z6834 Body mass index (BMI) 34.0-34.9, adult: Secondary | ICD-10-CM | POA: Diagnosis not present

## 2019-02-12 DIAGNOSIS — I120 Hypertensive chronic kidney disease with stage 5 chronic kidney disease or end stage renal disease: Secondary | ICD-10-CM | POA: Diagnosis present

## 2019-02-12 DIAGNOSIS — N186 End stage renal disease: Secondary | ICD-10-CM | POA: Diagnosis present

## 2019-02-12 LAB — BASIC METABOLIC PANEL
Anion gap: 9 (ref 5–15)
BUN: 31 mg/dL — ABNORMAL HIGH (ref 8–23)
CO2: 18 mmol/L — ABNORMAL LOW (ref 22–32)
Calcium: 7.7 mg/dL — ABNORMAL LOW (ref 8.9–10.3)
Chloride: 112 mmol/L — ABNORMAL HIGH (ref 98–111)
Creatinine, Ser: 6.24 mg/dL — ABNORMAL HIGH (ref 0.61–1.24)
GFR calc Af Amer: 10 mL/min — ABNORMAL LOW (ref >60)
GFR calc non Af Amer: 8 mL/min — ABNORMAL LOW (ref >60)
Glucose, Bld: 177 mg/dL — ABNORMAL HIGH (ref 70–99)
Potassium: 3.7 mmol/L (ref 3.5–5.1)
Sodium: 139 mmol/L (ref 135–145)

## 2019-02-12 LAB — CBC
HCT: 16.8 % — ABNORMAL LOW (ref 39.0–52.0)
Hemoglobin: 5.4 g/dL — CL (ref 13.0–17.0)
MCH: 21.9 pg — ABNORMAL LOW (ref 26.0–34.0)
MCHC: 32.1 g/dL (ref 30.0–36.0)
MCV: 68 fL — ABNORMAL LOW (ref 80.0–100.0)
Platelets: 185 10*3/uL (ref 150–400)
RBC: 2.47 MIL/uL — ABNORMAL LOW (ref 4.22–5.81)
RDW: 21.4 % — ABNORMAL HIGH (ref 11.5–15.5)
WBC: 8.1 10*3/uL (ref 4.0–10.5)
nRBC: 0.2 % (ref 0.0–0.2)

## 2019-02-12 LAB — HEMOGLOBIN AND HEMATOCRIT, BLOOD
HCT: 20.6 % — ABNORMAL LOW (ref 39.0–52.0)
Hemoglobin: 6.7 g/dL — CL (ref 13.0–17.0)

## 2019-02-12 LAB — HIV ANTIBODY (ROUTINE TESTING W REFLEX): HIV Screen 4th Generation wRfx: NONREACTIVE

## 2019-02-12 LAB — GLUCOSE, CAPILLARY
Glucose-Capillary: 125 mg/dL — ABNORMAL HIGH (ref 70–99)
Glucose-Capillary: 176 mg/dL — ABNORMAL HIGH (ref 70–99)

## 2019-02-12 LAB — SARS CORONAVIRUS 2 (TAT 6-24 HRS): SARS Coronavirus 2: NEGATIVE

## 2019-02-12 LAB — OCCULT BLOOD X 1 CARD TO LAB, STOOL: Fecal Occult Bld: NEGATIVE

## 2019-02-12 MED ORDER — AMLODIPINE BESYLATE 10 MG PO TABS
10.0000 mg | ORAL_TABLET | Freq: Every day | ORAL | Status: DC
  Administered 2019-02-12: 10 mg via ORAL
  Filled 2019-02-12: qty 1

## 2019-02-12 MED ORDER — ONDANSETRON HCL 4 MG PO TABS: 4.0000 mg | ORAL_TABLET | Freq: Four times a day (QID) | ORAL | Status: DC | PRN

## 2019-02-12 MED ORDER — METOPROLOL TARTRATE 25 MG PO TABS
25.0000 mg | ORAL_TABLET | Freq: Every day | ORAL | Status: DC
  Administered 2019-02-12: 25 mg via ORAL
  Filled 2019-02-12: qty 1

## 2019-02-12 MED ORDER — ACETAMINOPHEN 325 MG PO TABS: 650.0000 mg | ORAL_TABLET | Freq: Four times a day (QID) | ORAL | Status: DC | PRN

## 2019-02-12 MED ORDER — OMEGA-3-ACID ETHYL ESTERS 1 G PO CAPS
1.0000 g | ORAL_CAPSULE | Freq: Every day | ORAL | Status: DC
  Administered 2019-02-12: 1 g via ORAL
  Filled 2019-02-12: qty 1

## 2019-02-12 MED ORDER — VITAMIN B-12 1000 MCG PO TABS
1000.0000 ug | ORAL_TABLET | Freq: Every day | ORAL | Status: DC
  Administered 2019-02-12: 1000 ug via ORAL
  Filled 2019-02-12: qty 1

## 2019-02-12 MED ORDER — FUROSEMIDE 10 MG/ML IJ SOLN
60.0000 mg | Freq: Once | INTRAMUSCULAR | Status: DC
  Filled 2019-02-12: qty 6

## 2019-02-12 MED ORDER — INSULIN DETEMIR 100 UNIT/ML ~~LOC~~ SOLN
20.0000 [IU] | Freq: Every day | SUBCUTANEOUS | Status: DC
  Administered 2019-02-12: 20 [IU] via SUBCUTANEOUS
  Filled 2019-02-12: qty 0.2

## 2019-02-12 MED ORDER — FERROUS SULFATE 325 (65 FE) MG PO TABS
325.0000 mg | ORAL_TABLET | Freq: Every day | ORAL | Status: DC
  Administered 2019-02-12: 325 mg via ORAL
  Filled 2019-02-12: qty 1

## 2019-02-12 MED ORDER — PANTOPRAZOLE SODIUM 40 MG PO TBEC
40.0000 mg | DELAYED_RELEASE_TABLET | Freq: Every day | ORAL | Status: DC
  Administered 2019-02-12: 40 mg via ORAL
  Filled 2019-02-12: qty 1

## 2019-02-12 MED ORDER — DARBEPOETIN ALFA 60 MCG/0.3ML IJ SOSY
60.0000 ug | PREFILLED_SYRINGE | INTRAMUSCULAR | Status: DC
  Filled 2019-02-12: qty 0.3

## 2019-02-12 MED ORDER — SODIUM CHLORIDE 0.9 % IV SOLN
510.0000 mg | Freq: Once | INTRAVENOUS | Status: AC
  Administered 2019-02-12: 510 mg via INTRAVENOUS
  Filled 2019-02-12: qty 17

## 2019-02-12 MED ORDER — ACETAMINOPHEN 650 MG RE SUPP: 650.0000 mg | Freq: Four times a day (QID) | RECTAL | Status: DC | PRN

## 2019-02-12 MED ORDER — ONDANSETRON HCL 4 MG/2ML IJ SOLN: 4.0000 mg | Freq: Four times a day (QID) | INTRAMUSCULAR | Status: DC | PRN

## 2019-02-12 MED ORDER — ATORVASTATIN CALCIUM 10 MG PO TABS
20.0000 mg | ORAL_TABLET | Freq: Every day | ORAL | Status: DC
  Administered 2019-02-12: 20 mg via ORAL
  Filled 2019-02-12: qty 2

## 2019-02-12 NOTE — Progress Notes (Signed)
Progress Note    [name].  GBT:[id number] DOB: Jul 05, 1950  DOA: 02/11/2019 PCP: [facility]    Brief Narrative:     Medical records reviewed and are as summarized below:  [name]. is an 68 y.o. male with history of colon cancer status post right hemicolectomy in 2013 at [facility], chronic kidney disease likely stage V at this time, diabetes mellitus, hypertension was advised to come to the ER the patient had routine lab work done by his primary care physician at [facility] outpatient office.  Patient states he has been having some exertional symptoms last 1 week.  But has not had any syncope palpitations.  Has not noticed any blood in the stool or black stools  Assessment/Plan:   Principal Problem:   Symptomatic anemia Active Problems:   OBSTRUCTIVE SLEEP APNEA   Colon cancer, status Post resection    HYPERTENSION   Type 2 diabetes mellitus with renal complication (HCC)   Symptomatic anemia with microcytic hypochromic picture with iron deficiency plus CKD -occult blood negative -the fact that he has minimal to no symptoms of this seems to make anemia of CKD and a slower process more likely - getting 3 units of PRBC transfusion -IV Fe -IV procrit x 1 -in 2019 he was getting regular Fe infusions as well as procrit infusions via Dr. Jimmy Footman but he stopped going -will give IV lasix as well between units of PRBC  Chronic kidney disease stage V  -[facility] Dr[facility] Jimmy Footman.  Dr. D had recommended he get a fistula in preparation for HD but patient said [facility] advised against this -requested labs from [place] and [facility]  Hypertension - on amlodipine and metoprolol.  Diabetes mellitus type 2 -Levemir -SSI  History of severe dysplasia but no cancer status post right hemicolectomy in 2013 -does not appear to have followed up with GI as recommended  Hyperlipidemia  - statins.  obesity Body mass index  is 34.17 kg/m.   Family Communication/Anticipated D/C date and plan/Code Status   DVT prophylaxis: scd Code Status: Full Code.  Family Communication:  Disposition Plan: s/p 2 units of PRBC and Hgb still < 7-- needs another unit, needs IV Fe, and IV procrit   Medical Consultants:    None.     Subjective:   Dr. Jimmy Footman wanted him to get a fistula in anticipation for HD but per patient his [place] doctor has advised him against so he has not done  Objective:    Vitals:   02/12/19 0600 02/12/19 0728 02/12/19 1141 02/12/19 1215  BP: (!) 158/84 (!) 192/80 (!) 165/78 (!) 159/68  Pulse: 66 63 64 (!) 58  Resp: 18 18 15 16   Temp: 98.1 F (36.7 C) 97.7 F (36.5 C) 98.8 F (37.1 C) 98.8 F (37.1 C)  TempSrc: Oral Oral Oral Oral  SpO2: 100% 100% 100% 99%  Weight:      Height:        Intake/Output Summary (Last 24 hours) at 02/12/2019 1218 Last data filed at 02/12/2019 1205 Gross per 24 hour  Intake 1418 ml  Output 600 ml  Net 818 ml   Filed Weights   02/11/19 2102 02/12/19 0430  Weight: 111.1 kg 111.1 kg    Exam: Sitting on side of bed, NAD rrr Crackles at bases A+Ox3  Data Reviewed:   I have personally reviewed following labs and imaging studies:  Labs: Labs show the following:  Basic Metabolic Panel: Recent Labs  Lab 02/11/19 1721 02/12/19 0300  NA 135 139  K 3.9 3.7  CL 110 112*  CO2 17* 18*  GLUCOSE 109* 177*  BUN 32* 31*  CREATININE 6.53* 6.24*  CALCIUM 7.8* 7.7*   GFR Estimated Creatinine Clearance: 14.6 mL/min (A) (by C-G formula based on SCr of 6.24 mg/dL (H)). Liver Function Tests: Recent Labs  Lab 02/11/19 1721  AST 21  ALT 14  ALKPHOS 54  BILITOT 0.7  PROT 6.8  ALBUMIN 3.0*   No results for input(s): LIPASE, AMYLASE in the last 168 hours. No results for input(s): AMMONIA in the last 168 hours. Coagulation profile No results for input(s): INR, PROTIME in the last 168 hours.  CBC: Recent Labs  Lab 02/11/19 1721 02/12/19  0300 02/12/19 0957  WBC 7.7 8.1  --   HGB 4.5* 5.4* 6.7*  HCT 14.7* 16.8* 20.6*  MCV 65.6* 68.0*  --   PLT 200 185  --    Cardiac Enzymes: No results for input(s): CKTOTAL, CKMB, CKMBINDEX, TROPONINI in the last 168 hours. BNP (last 3 results) No results for input(s): PROBNP in the last 8760 hours. CBG: Recent Labs  Lab 02/12/19 0729 02/12/19 1122  GLUCAP 125* 176*   D-Dimer: No results for input(s): DDIMER in the last 72 hours. Hgb A1c: No results for input(s): HGBA1C in the last 72 hours. Lipid Profile: No results for input(s): CHOL, HDL, LDLCALC, TRIG, CHOLHDL, LDLDIRECT in the last 72 hours. Thyroid function studies: No results for input(s): TSH, T4TOTAL, T3FREE, THYROIDAB in the last 72 hours.  Invalid input(s): FREET3 Anemia work up: Recent Labs    02/11/19 2011  VITAMINB12 320  FOLATE 9.7  FERRITIN 5*  TIBC 426  IRON 21*  RETICCTPCT 2.4   Sepsis Labs: Recent Labs  Lab 02/11/19 1721 02/12/19 0300  WBC 7.7 8.1    Microbiology Recent Results (from the past 240 hour(s))  SARS CORONAVIRUS 2 (TAT 6-24 HRS) Nasopharyngeal Nasopharyngeal Swab     Status: None   Collection Time: 02/11/19 10:00 PM   Specimen: Nasopharyngeal Swab  Result Value Ref Range Status   SARS Coronavirus 2 NEGATIVE NEGATIVE Final    Comment: (NOTE) SARS-CoV-2 target nucleic acids are NOT DETECTED. The SARS-CoV-2 RNA is generally detectable in upper and lower respiratory specimens during the acute phase of infection. Negative results do not preclude SARS-CoV-2 infection, do not rule out co-infections with other pathogens, and should not be used as the sole basis for treatment or other patient management decisions. Negative results must be combined with clinical observations, patient history, and epidemiological information. The expected result is Negative. Fact Sheet for Patients: SugarRoll.be Fact Sheet for Healthcare Providers:  https://www.woods-mathews.com/ This test is not yet approved or cleared by the [place] FDA and  has been authorized for detection and/or diagnosis of SARS-CoV-2 by FDA under an Emergency Use Authorization (EUA). This EUA will remain  in effect (meaning this test can be used) for the duration of the COVID-19 declaration under Section 56 4(b)(1) of the Act, 21 U.S.C. section 360bbb-3(b)(1), unless the authorization is terminated or revoked sooner. Performed at [facility], [place] [address] [place], [place] [address]     Procedures and diagnostic studies:  Dg Chest Port 1 View  Result Date: 02/11/2019 CLINICAL DATA:  Pre transfusion EXAM: PORTABLE CHEST 1 VIEW COMPARISON:  07/03/2015 FINDINGS: Heart is mildly enlarged. Mild vascular congestion. No overt edema. No effusions or acute bony abnormality. IMPRESSION: Cardiomegaly, vascular congestion. Electronically  Signed   By: Rolm Baptise M.D.   On: 02/11/2019 20:31    Medications:   . amLODipine  10 mg Oral Daily  . atorvastatin  20 mg Oral Daily  . darbepoetin (ARANESP) injection - NON-DIALYSIS  60 mcg Subcutaneous Q Fri-1800  . insulin aspart  0-9 Units Subcutaneous TID WC  . insulin detemir  20 Units Subcutaneous Daily  . metoprolol tartrate  25 mg Oral Daily  . omega-3 acid ethyl esters  1 g Oral Daily  . pantoprazole  40 mg Oral Daily  . vitamin B-12  1,000 mcg Oral Daily   Continuous Infusions: . sodium chloride       LOS: 0 days   Geradine Girt  [facility]   How to contact the [facility] Attending or Consulting provider [place] or covering provider during after hours [place], for this patient?  1. Check the care team in [facility] and look for a) attending/consulting TRH provider listed and b) the [facility] team listed 2. Log into [email or web address] and use [place]'s universal password to access. If you do not have the password, please contact the hospital operator. 3. Locate the [facility] provider you are looking  for under [facility] and page to a number that you can be directly reached. 4. If you still have difficulty reaching the provider, please page the [facility] (Director on Call) for the Hospitalists listed on amion for assistance.  02/12/2019, 12:18 PM

## 2019-02-12 NOTE — Plan of Care (Signed)
  Problem: Education: Goal: Knowledge of General Education information will improve Description Including pain rating scale, medication(s)/side effects and non-pharmacologic comfort measures Outcome: Progressing   Problem: Health Behavior/Discharge Planning: Goal: Ability to manage health-related needs will improve Outcome: Progressing   

## 2019-02-12 NOTE — Progress Notes (Signed)
Pharmacy - Epo to [facility]  Anemia of chronic kidney disease Receiving approximately 30,000 units of Epo prior to admission  ? Compliance  Plan: Aranesp 60 mcg sq Friday Pharmacy to sign off  Thank you Anette Guarneri, PharmD

## 2019-02-12 NOTE — Progress Notes (Signed)
Pt ride arrived and he left facility AMA. Documentation placed on hard chart.

## 2019-02-12 NOTE — Progress Notes (Signed)
Pt wants to go home. He stated he has a dog there who is unattended since yesterday and denies being able to get anyone to assist him in caring for the dog. Was educated on risk associated with low hemoglobin and leaving prior to condition improving. MD was notified of pt request to leave AMA. Pt also refused lasix dosed as ordered. Was educated on purpose and risk of not taking medication.

## 2019-02-13 LAB — BPAM RBC
Blood Product Expiration Date: 202009262359
Blood Product Expiration Date: 202009292359
Blood Product Expiration Date: 202010042359
ISSUE DATE / TIME: 202009102108
ISSUE DATE / TIME: 202009110406
ISSUE DATE / TIME: 202009111159
Unit Type and Rh: 6200
Unit Type and Rh: 8400
Unit Type and Rh: 8400

## 2019-02-13 LAB — TYPE AND SCREEN
ABO/RH(D): AB POS
Antibody Screen: NEGATIVE
Unit division: 0
Unit division: 0
Unit division: 0

## 2019-02-15 NOTE — Discharge Summary (Signed)
Patient left AMA-- evidently feeling better after transfusion.  Will need regular IV fe and IV procrit and close outpatient follow up -see progress note from 9/11

## 2019-02-16 ENCOUNTER — Telehealth (HOSPITAL_COMMUNITY): Payer: Self-pay

## 2019-02-16 NOTE — Telephone Encounter (Signed)
Pt. Called and wanted to have his Hospital visit, lab results and test sent to his [place] doctor in [place],.  Explained to pt. That he should contact his PCP and sign a form so they can obtain his Hospital visit records.  Pt. Verbalized understanding and all questions answered.

## 2019-02-25 ENCOUNTER — Telehealth (HOSPITAL_COMMUNITY): Payer: Self-pay

## 2019-02-25 NOTE — Telephone Encounter (Signed)
lmtcb

## 2019-02-25 NOTE — Telephone Encounter (Signed)
New message    Just an FYI. We have made several attempts to contact this patient including sending a letter to schedule or reschedule their echocardiogram. We will be removing the patient from the echo [place].   9.23.20 @ 4:17pm lm on home vm Sheresa Cullop  9.10.20 @ 11:44am lm on home vm Damarkus Balis  9.8.20 no show

## 2019-03-10 ENCOUNTER — Encounter (HOSPITAL_COMMUNITY): Payer: Medicare Other

## 2019-03-11 ENCOUNTER — Other Ambulatory Visit: Payer: Self-pay

## 2019-03-11 ENCOUNTER — Ambulatory Visit (HOSPITAL_COMMUNITY)
Admission: RE | Admit: 2019-03-11 | Discharge: 2019-03-11 | Disposition: A | Discharge status: Home / Self Care | Payer: No Typology Code available for payment source | Source: Ambulatory Visit | Attending: Cardiology | Admitting: Cardiology

## 2019-03-11 DIAGNOSIS — R0989 Other specified symptoms and signs involving the circulatory and respiratory systems: Secondary | ICD-10-CM | POA: Diagnosis present

## 2019-03-30 ENCOUNTER — Encounter: Payer: Self-pay | Admitting: Gastroenterology

## 2019-05-04 ENCOUNTER — Other Ambulatory Visit (INDEPENDENT_AMBULATORY_CARE_PROVIDER_SITE_OTHER): Payer: Medicare Other

## 2019-05-04 ENCOUNTER — Encounter: Payer: Self-pay | Admitting: Gastroenterology

## 2019-05-04 ENCOUNTER — Ambulatory Visit (INDEPENDENT_AMBULATORY_CARE_PROVIDER_SITE_OTHER): Payer: Medicare Other | Admitting: Gastroenterology

## 2019-05-04 VITALS — BP 150/80 | HR 64 | Temp 98.6°F | Ht 70.5 in | Wt 242.0 lb

## 2019-05-04 DIAGNOSIS — N186 End stage renal disease: Secondary | ICD-10-CM | POA: Diagnosis not present

## 2019-05-04 DIAGNOSIS — D509 Iron deficiency anemia, unspecified: Secondary | ICD-10-CM | POA: Diagnosis not present

## 2019-05-04 DIAGNOSIS — K7460 Unspecified cirrhosis of liver: Secondary | ICD-10-CM

## 2019-05-04 LAB — BASIC METABOLIC PANEL
BUN: 56 mg/dL — ABNORMAL HIGH (ref 6–23)
CO2: 15 mEq/L — ABNORMAL LOW (ref 19–32)
Calcium: 8.2 mg/dL — ABNORMAL LOW (ref 8.4–10.5)
Chloride: 114 mEq/L — ABNORMAL HIGH (ref 96–112)
Creatinine, Ser: 7.08 mg/dL (ref 0.40–1.50)
GFR: 9.4 mL/min — CL (ref >60.00)
Glucose, Bld: 102 mg/dL — ABNORMAL HIGH (ref 70–99)
Potassium: 4.9 mEq/L (ref 3.5–5.1)
Sodium: 136 mEq/L (ref 135–145)

## 2019-05-04 LAB — CBC WITH DIFFERENTIAL/PLATELET
Basophils Absolute: 0 10*3/uL (ref 0.0–0.1)
Basophils Relative: 0.5 % (ref 0.0–3.0)
Eosinophils Absolute: 0.3 10*3/uL (ref 0.0–0.7)
Eosinophils Relative: 4.3 % (ref 0.0–5.0)
HCT: 26.1 % — ABNORMAL LOW (ref 39.0–52.0)
Hemoglobin: 8.9 g/dL — ABNORMAL LOW (ref 13.0–17.0)
Lymphocytes Relative: 21.5 % (ref 12.0–46.0)
Lymphs Abs: 1.6 10*3/uL (ref 0.7–4.0)
MCHC: 33.9 g/dL (ref 30.0–36.0)
MCV: 75.4 fl — ABNORMAL LOW (ref 78.0–100.0)
Monocytes Absolute: 0.5 10*3/uL (ref 0.1–1.0)
Monocytes Relative: 7.3 % (ref 3.0–12.0)
Neutro Abs: 4.8 10*3/uL (ref 1.4–7.7)
Neutrophils Relative %: 66.4 % (ref 43.0–77.0)
Platelets: 203 10*3/uL (ref 150.0–400.0)
RBC: 3.47 Mil/uL — ABNORMAL LOW (ref 4.22–5.81)
RDW: 20.8 % — ABNORMAL HIGH (ref 11.5–15.5)
WBC: 7.2 10*3/uL (ref 4.0–10.5)

## 2019-05-04 MED ORDER — NA SULFATE-K SULFATE-MG SULF 17.5-3.13-1.6 GM/177ML PO SOLN: 1.0000 | Freq: Once | ORAL | 0 refills | Status: AC

## 2019-05-04 NOTE — Progress Notes (Signed)
HPI: This is a very pleasant 68 year old man who was referred to me by Dr. Linton Ham, gastroenterologist at [facility] as a "community care consult" for EGD and colonoscopy  Chief complaint is history of colon cancer, recent severe iron deficiency anemia, history of cirrhosis, history of advanced kidney failure  He was referred by a gastroenterologist in the [place] system.  He saw the gastroenterologist March 01, 2019 and I have a copy of his office note.  In early September he was found to have a hemoglobin of 4.4, ferritin 5.  He was briefly hospitalized and then left AMA before work-up could be entertained.  He did have 3 units blood transfusion however.  Repeat hemoglobin done at that [facility] facility showed his hemoglobin was 8.5 February 17, 2019.  He has a history of colon cancer status post 2013 right hemicolectomy.  This was a T3 lesion.  Unfortunately complicated by anastomotic leak requiring an ileostomy September 2013.  With that colon cancer was diagnosed by one of my previous partners [name].  He underwent a follow-up colonoscopy March 2015 by [name] as well and no polyps or recurrent cancer was noted.  He was recommended to have repeat colonoscopy 3 years from then.  It does not look like he has had a repeat colonoscopy in about 6 years now.  He apparently also has cirrhosis according to that gastroenterologist's note.  This was diagnosed for at least noted December 2017.  It is felt to be from hepatitis C.  Liver ultrasound December 2019 showed or at least suggested cirrhosis without focal lesions.  Also cholelithiasis.  The [place] gastroenterologist, Dr. Hassie Bruce, sent him here as a "community care consult" for upper endoscopy and colonoscopy.  He and I discussed all this above.  He has not been taking his iron supplement.  He has not seen a kidney doctor in at least 6 or 8 months and his creatinine hovers around 6 or 7.  He tells me that he had a nephrologist at the [place]  and a nephrologist here in [place].  1 or both of them has retired.  He is not sure of either of their names.  He believes he was being primed for hemodialysis start.  He has no overt GI bleeding, his weight is overall stable, no significant abdominal pains.  He is not bothered by edema in his ankles or abdomen  Old Data Reviewed:     Review of systems: Pertinent positive and negative review of systems were noted in the above HPI section. All other review negative.   Past Medical History:  Diagnosis Date  . Anemia   . Arthritis    left knee  . Chronic kidney disease    acute renal failure/injury requiring short-term HD 2013  . Colon cancer ([place])   . Colon polyp    11/2011 - Polyps identified, biopsy - invasive adenocarinoma  . DM II (diabetes mellitus, type II), controlled ([place])    type 2 IDDM x 15 years. A1C 1/09 13.7.   Marland Kitchen 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 12/25/2011  . GERD (gastroesophageal reflux disease)   . Heart murmur   . 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 Hyperlipidemia   . Hypertension    for a few years and resolved in 2013/2014.  . Sleep apnea    does not wear CPAP now    Past Surgical History:  Procedure Laterality Date  . APPLICATION OF WOUND VAC  02/16/2012   Procedure: APPLICATION OF  WOUND VAC;  Surgeon: Zenovia Jarred, MD;  Location: [place];  Service: General;  Laterality: N/A;  . APPLICATION OF WOUND VAC  02/26/2012   Procedure: APPLICATION OF WOUND VAC;  Surgeon: Imogene Burn. [name], MD;  Location: [place];  Service: General;  Laterality: N/A;  . COLONOSCOPY    . COLONOSCOPY  02/06/2012   Procedure: COLONOSCOPY;  Surgeon: Milus Banister, MD;  Location: [place];  Service: Endoscopy;;  . COLOSTOMY REVERSAL    . DRESSING CHANGE UNDER ANESTHESIA  02/18/2012   Procedure: DRESSING CHANGE UNDER ANESTHESIA;  Surgeon: Odis Hollingshead, MD;  Location: [place];  Service: General;  Laterality: N/A;  . GANGLION CYST EXCISION  20 YRS AGO   RT ARM  . ILEOSTOMY   02/16/2012   Procedure: ILEOSTOMY;  Surgeon: Zenovia Jarred, MD;  Location: [place];  Service: General;  Laterality: N/A;  . ILEOSTOMY CLOSURE N/A 09/02/2012   Procedure: ILEOSTOMY REVERSAL;  Surgeon: Imogene Burn. [name], MD;  Location: [place];  Service: General;  Laterality: N/A;  . ILIOSTOMY    . INCISION AND DRAINAGE OF WOUND  02/23/2012   Procedure: IRRIGATION AND DEBRIDEMENT WOUND;  Surgeon: Joyice Faster. Cornett, MD;  Location: [place];  Service: General;  Laterality: N/A;  . LAPAROTOMY  02/16/2012   Procedure: EXPLORATORY LAPAROTOMY;  Surgeon: Zenovia Jarred, MD;  [facility];  Service: General;  Laterality: N/A;  Exploratory Laparotomy with resection of anastomosis  . LAPAROTOMY  02/18/2012   Procedure: EXPLORATORY LAPAROTOMY;  Surgeon: Odis Hollingshead, MD;  Location: [place];  Service: General;  Laterality: N/A;  exploratory laparotomy  change of abdominal vac dressing  . LAPAROTOMY  02/20/2012   Procedure: EXPLORATORY LAPAROTOMY;  Surgeon: Odis Hollingshead, MD;  Location: [place];  Service: General;  Laterality: N/A;  . LAPAROTOMY  02/23/2012   Procedure: EXPLORATORY LAPAROTOMY;  Surgeon: Joyice Faster. Cornett, MD;  Location: [place];  Service: General;  Laterality: N/A;  Irrigation and Debridement of abdominal wound with wound vac change with partial closure  . LAPAROTOMY  02/26/2012   Procedure: EXPLORATORY LAPAROTOMY;  Surgeon: Imogene Burn. [name], MD;  Location: [place];  Service: General;  Laterality: N/A;     . PARTIAL COLECTOMY  02/06/2012  . PARTIAL COLECTOMY  02/06/2012   Procedure: PARTIAL COLECTOMY;  Surgeon: Imogene Burn. [name], MD;  Location: [place];  Service: General;  Laterality: N/A;  right partial colectomy  . RESECTION SMALL BOWEL / CLOSURE ILEOSTOMY  402/2014   Dr [name]  . VACUUM ASSISTED CLOSURE CHANGE  02/20/2012   Procedure: ABDOMINAL VACUUM ASSISTED CLOSURE CHANGE;  Surgeon: Odis Hollingshead, MD;  Location: [place];  Service: General;;  . VACUUM ASSISTED CLOSURE CHANGE  02/23/2012    Procedure: ABDOMINAL VACUUM ASSISTED CLOSURE CHANGE;  Surgeon: Joyice Faster. Cornett, MD;  Location: [place] [facility];  Service: General;  Laterality: N/A;    Current Outpatient Medications  Medication Sig Dispense Refill  . ASPIRIN 81 PO Take 81 mg by mouth 2 (two) times a week.     Marland Kitchen atorvastatin (LIPITOR) 20 MG tablet Take 20 mg by mouth daily.    . ferrous sulfate (SLOW IRON) 160 (50 Fe) MG TBCR SR tablet Take 1 tablet by mouth daily as needed (for supplementation).    . insulin detemir (LEVEMIR) 100 UNIT/ML injection Inject 0.24 mLs (24 Units total) into the skin at bedtime. (Patient taking differently: Inject 26 Units into the skin daily. ) 10 mL 11  . metoprolol tartrate (LOPRESSOR) 50  MG tablet Take 25 mg by mouth daily.     . Omega-3 Fatty Acids (FISH OIL) 1000 MG CPDR Take 1,000 mg by mouth every other day.     Marland Kitchen omeprazole (PRILOSEC) 20 MG capsule Take 20 mg by mouth daily.    . sildenafil (VIAGRA) 100 MG tablet Take 100 mg by mouth daily as needed for erectile dysfunction (ONE HOUR PRIOR TO SEXUAL ACTIVITY).    Marland Kitchen vitamin B-12 (CYANOCOBALAMIN) 1000 MCG tablet Take 1,000 mcg by mouth daily.     No current facility-administered medications for this visit.     Allergies as of 05/04/2019  . (No Known Allergies)    Family History  Problem Relation Age of Onset  . Diabetes Father   . Heart disease Father   . Heart disease Mother   . Diabetes Sister   . Heart disease Sister   . Colon cancer Neg Hx     Social History   Socioeconomic History  . Marital status: Divorced    Spouse name: Not on file  . Number of children: 3  . Years of education: Not on file  . Highest education level: Not on file  Occupational History    Employer: [employer]    Comment: Worked at [employer]  . Financial resource strain: Not on file  . Food insecurity    Worry: Not on file    Inability: Not on file  . Transportation needs    Medical: Not on file    Non-medical: Not on file  Tobacco Use   . Smoking status: Current Every Day Smoker    Packs/day: 0.50    Years: 30.00    Pack years: 15.00    Types: Cigarettes  . Smokeless tobacco: Never Used  Substance and Sexual Activity  . Alcohol use: Yes    Comment: Liquor twice monthly.  . Drug use: No  . Sexual activity: Not on file  Lifestyle  . Physical activity    Days per week: Not on file    Minutes per session: Not on file  . Stress: Not on file  Relationships  . Social Herbalist on phone: Not on file    Gets together: Not on file    Attends religious service: Not on file    Active member of club or organization: Not on file    Attends meetings of clubs or organizations: Not on file    Relationship status: Not on file  . Intimate partner violence    Fear of current or ex partner: Not on file    Emotionally abused: Not on file    Physically abused: Not on file    Forced sexual activity: Not on file  Other Topics Concern  . Not on file  Social History Narrative   Financial assistance approved for 100% discount at [facility] and has [facility] card   [employer]  August 08, 2009 5:48 PM   *PATIENT WAS GIVEN DM CARD.Lela Sturdivant NT II  June 08, 2010 3:56 PM      Divorced, one daughter currently staying with him   Has 7 grand children   Reports disability and medicaid is pending     Physical Exam: Temp 98.6 F (37 C)   Ht 5' 10.5" (1.791 m) Comment: height measured without shoes  Wt 242 lb (109.8 kg)   BMI 34.23 kg/m  Constitutional: generally well-appearing Psychiatric: alert and oriented x3 Eyes: extraocular movements intact Mouth: oral pharynx  moist, no lesions Neck: supple no lymphadenopathy Cardiovascular: heart regular rate and rhythm Lungs: clear to auscultation bilaterally Abdomen: soft, nontender, nondistended, no obvious ascites, no peritoneal signs, normal bowel sounds Extremities: no lower extremity edema bilaterally Skin: no lesions on visible extremities   Assessment and plan: 68  y.o. male with cirrhosis, history of colon cancer, recent severe iron deficiency anemia, advanced renal disease  First I am struck by the fact that his creatinine is around 6 or 7 and yet he cannot identify his nephrologist.  He says he has not seen a nephrologist in 6 or 8 months now.  From what he tells me he was being primed to start hemodialysis at some point in the near future.  He tells me his [facility] nephrologist retired.  He does not remember his name anyway.  He is not sure who he has seen from the [facility] from nephrology.  I think this is a very important overall health question for him and I am going to arrange for referral for him to meet a nephrologist here in [place] to help with that.  Second he had colon cancer in 2013.  Repeat colonoscopy 2015 shows no polyps.  He is overdue for surveillance.  He also has severe iron deficiency anemia with a ferritin around 5 and hemoglobin 4.4  2 months ago.  He has not had repeat labs checked in over 2 months that I can tell.  I am going to arrange for CBC and a basic metabolic profile today.  He has not been taking his iron supplement.  We will write down over-the-counter iron supplement ferrous sulfate 325 mg and asked that he start taking it today.  1 pill once daily.  I recommended a colonoscopy and since he has iron deficiency and also has cirrhosis and has not been screened for varices we will do an upper endoscopy at the same time for him.  He recently established with a gastroenterologist at the [facility] system in [place] however he was referred here for the procedures EGD and colonoscopy as part of a "community care consult." I am not sure exactly what those are but I am certainly happy to do these procedures for him and I will forward the results of those tests to his [place] gastroenterologist.   Please see the "Patient Instructions" section for addition details about the plan.   Owens Loffler, MD [place]  Gastroenterology 05/04/2019, 11:18 AM  Cc: [facility], Dr. Linton Ham Gastroenterology.

## 2019-05-04 NOTE — Patient Instructions (Addendum)
If you are age 68 or older, your body mass index should be between 23-30. Your Body mass index is 34.23 kg/m. If this is out of the aforementioned range listed, please consider follow up with your Primary Care Provider.  If you are age 9 or younger, your body mass index should be between 19-25. Your Body mass index is 34.23 kg/m. If this is out of the aformentioned range listed, please consider follow up with your Primary Care Provider.    Your provider has requested that you go to the basement level for lab work before leaving today. Press "B" on the elevator. The lab is located at the first door on the left as you exit the elevator.   Please purchase the following medications over the counter and take as directed:  1. Start Ferrous Sulfate 325 mg OTC daily.    You have been scheduled for an endoscopy and colonoscopy. Please follow the written instructions given to you at your visit today. Please pick up your prep supplies at the pharmacy within the next 1-3 days. If you use inhalers (even only as needed), please bring them with you on the day of your procedure.

## 2019-06-03 NOTE — Progress Notes (Signed)
Pre-op endo call attempted, no answer. Lmtcb

## 2019-06-07 ENCOUNTER — Telehealth: Payer: Self-pay | Admitting: Cardiovascular Disease

## 2019-06-07 ENCOUNTER — Telehealth: Payer: Self-pay | Admitting: Gastroenterology

## 2019-06-07 ENCOUNTER — Other Ambulatory Visit (HOSPITAL_COMMUNITY)
Admission: RE | Admit: 2019-06-07 | Discharge: 2019-06-07 | Disposition: A | Discharge status: Home / Self Care | Payer: Medicare Other | Source: Ambulatory Visit | Attending: Gastroenterology | Admitting: Gastroenterology

## 2019-06-07 DIAGNOSIS — Z20822 Contact with and (suspected) exposure to covid-19: Secondary | ICD-10-CM | POA: Diagnosis not present

## 2019-06-07 DIAGNOSIS — Z01812 Encounter for preprocedural laboratory examination: Secondary | ICD-10-CM | POA: Diagnosis present

## 2019-06-07 MED ORDER — NA SULFATE-K SULFATE-MG SULF 17.5-3.13-1.6 GM/177ML PO SOLN: 1.0000 | Freq: Once | ORAL | 0 refills | Status: AC

## 2019-06-07 NOTE — Telephone Encounter (Signed)
Pt is scheduled for a colon at [facility] and stated that he has not received prep.  He uses [facility].

## 2019-06-07 NOTE — Telephone Encounter (Signed)
[name] aware pt did not have echo done and pt needs to call office and reschedule and also Dialysis facility  needs to contact cardiology if needs clearance for procedure ./cy

## 2019-06-07 NOTE — Telephone Encounter (Signed)
Informed patient the prep was sent to the [facility] in [place] originally. Since he has not received in I sent the prep script to [facility]. Patient will call back to confirm he has picked up his prep.

## 2019-06-07 NOTE — Telephone Encounter (Signed)
New Message:     Maylon Cos from the [facility] called. He said he need to know the status of pt's Dialysis Catheter Placement please.

## 2019-06-08 LAB — NOVEL CORONAVIRUS, NAA (HOSP ORDER, SEND-OUT TO REF LAB; TAT 18-24 HRS): SARS-CoV-2, NAA: NOT DETECTED

## 2019-06-10 ENCOUNTER — Encounter (HOSPITAL_COMMUNITY)
Admission: RE | Disposition: A | Discharge status: Home / Self Care | Payer: Self-pay | Source: Home / Self Care | Attending: Gastroenterology

## 2019-06-10 ENCOUNTER — Ambulatory Visit (HOSPITAL_COMMUNITY): Payer: No Typology Code available for payment source | Admitting: Certified Registered Nurse Anesthetist

## 2019-06-10 ENCOUNTER — Encounter (HOSPITAL_COMMUNITY): Payer: Self-pay | Admitting: Gastroenterology

## 2019-06-10 ENCOUNTER — Other Ambulatory Visit: Payer: Self-pay

## 2019-06-10 ENCOUNTER — Ambulatory Visit (HOSPITAL_COMMUNITY)
Admission: RE | Admit: 2019-06-10 | Discharge: 2019-06-10 | Disposition: A | Discharge status: Home / Self Care | Payer: No Typology Code available for payment source | Attending: Gastroenterology | Admitting: Gastroenterology

## 2019-06-10 DIAGNOSIS — E785 Hyperlipidemia, unspecified: Secondary | ICD-10-CM | POA: Insufficient documentation

## 2019-06-10 DIAGNOSIS — G4730 Sleep apnea, unspecified: Secondary | ICD-10-CM | POA: Diagnosis not present

## 2019-06-10 DIAGNOSIS — N189 Chronic kidney disease, unspecified: Secondary | ICD-10-CM | POA: Insufficient documentation

## 2019-06-10 DIAGNOSIS — F17210 Nicotine dependence, cigarettes, uncomplicated: Secondary | ICD-10-CM | POA: Insufficient documentation

## 2019-06-10 DIAGNOSIS — K449 Diaphragmatic hernia without obstruction or gangrene: Secondary | ICD-10-CM | POA: Diagnosis not present

## 2019-06-10 DIAGNOSIS — D509 Iron deficiency anemia, unspecified: Secondary | ICD-10-CM | POA: Diagnosis present

## 2019-06-10 DIAGNOSIS — Z9049 Acquired absence of other specified parts of digestive tract: Secondary | ICD-10-CM | POA: Insufficient documentation

## 2019-06-10 DIAGNOSIS — E1122 Type 2 diabetes mellitus with diabetic chronic kidney disease: Secondary | ICD-10-CM | POA: Insufficient documentation

## 2019-06-10 DIAGNOSIS — M1712 Unilateral primary osteoarthritis, left knee: Secondary | ICD-10-CM | POA: Diagnosis not present

## 2019-06-10 DIAGNOSIS — I129 Hypertensive chronic kidney disease with stage 1 through stage 4 chronic kidney disease, or unspecified chronic kidney disease: Secondary | ICD-10-CM | POA: Diagnosis not present

## 2019-06-10 DIAGNOSIS — K7460 Unspecified cirrhosis of liver: Secondary | ICD-10-CM

## 2019-06-10 DIAGNOSIS — Z794 Long term (current) use of insulin: Secondary | ICD-10-CM | POA: Insufficient documentation

## 2019-06-10 DIAGNOSIS — Z86010 Personal history of colonic polyps: Secondary | ICD-10-CM | POA: Diagnosis not present

## 2019-06-10 DIAGNOSIS — K219 Gastro-esophageal reflux disease without esophagitis: Secondary | ICD-10-CM | POA: Insufficient documentation

## 2019-06-10 DIAGNOSIS — Z85038 Personal history of other malignant neoplasm of large intestine: Secondary | ICD-10-CM | POA: Diagnosis not present

## 2019-06-10 HISTORY — PX: COLONOSCOPY WITH PROPOFOL: SHX5780

## 2019-06-10 HISTORY — PX: ESOPHAGOGASTRODUODENOSCOPY (EGD) WITH PROPOFOL: SHX5813

## 2019-06-10 LAB — POCT I-STAT, CHEM 8
BUN: 30 mg/dL — ABNORMAL HIGH (ref 8–23)
Calcium, Ion: 1.26 mmol/L (ref 1.15–1.40)
Chloride: 115 mmol/L — ABNORMAL HIGH (ref 98–111)
Creatinine, Ser: 7.2 mg/dL — ABNORMAL HIGH (ref 0.61–1.24)
Glucose, Bld: 177 mg/dL — ABNORMAL HIGH (ref 70–99)
HCT: 24 % — ABNORMAL LOW (ref 39.0–52.0)
Hemoglobin: 8.2 g/dL — ABNORMAL LOW (ref 13.0–17.0)
Potassium: 4.2 mmol/L (ref 3.5–5.1)
Sodium: 143 mmol/L (ref 135–145)
TCO2: 17 mmol/L — ABNORMAL LOW (ref 22–32)

## 2019-06-10 SURGERY — COLONOSCOPY WITH PROPOFOL
Anesthesia: Monitor Anesthesia Care

## 2019-06-10 MED ORDER — LACTATED RINGERS IV SOLN: INTRAVENOUS | Status: DC | PRN

## 2019-06-10 MED ORDER — PROPOFOL 10 MG/ML IV BOLUS
INTRAVENOUS | Status: DC | PRN
  Administered 2019-06-10: 20 mg via INTRAVENOUS

## 2019-06-10 MED ORDER — PROPOFOL 500 MG/50ML IV EMUL
INTRAVENOUS | Status: DC | PRN
  Administered 2019-06-10: 125 ug/kg/min via INTRAVENOUS

## 2019-06-10 MED ORDER — SODIUM CHLORIDE 0.9 % IV SOLN
INTRAVENOUS | Status: DC
  Administered 2019-06-10: 500 mL via INTRAVENOUS

## 2019-06-10 MED ORDER — LIDOCAINE 2% (20 MG/ML) 5 ML SYRINGE
INTRAMUSCULAR | Status: DC | PRN
  Administered 2019-06-10: 80 mg via INTRAVENOUS

## 2019-06-10 SURGICAL SUPPLY — 24 items

## 2019-06-10 NOTE — Op Note (Signed)
[facility] Patient Name: [name] Procedure Date: 06/10/2019 MRN: [id number] Attending MD: Milus Banister , MD Date of Birth: [birth year] CSN: [id number] Age: 69 Admit Type: Outpatient Procedure:                Colonoscopy Indications:              Iron deficiency anemia, 2016 right hemicolectomy                            for colon cancer (referrd by Linton Ham, MD                            GAstroenterologist from [facility] for                            'community care' coverage) Providers:                Milus Banister, MD, Cleda Daub, RN, Corie Chiquito, Technician, Cletis Athens, Technician, Caryl Pina CRNA Referring MD:             Linton Ham, MD (Gastroenterologist) at                            [facility]. Medicines:                Monitored Anesthesia Care Complications:            No immediate complications. Estimated blood loss:                            None. Estimated Blood Loss:     Estimated blood loss: none. Procedure:                Pre-Anesthesia Assessment:                           - Prior to the procedure, a History and Physical                            was performed, and patient medications and                            allergies were reviewed. The patient's tolerance of                            previous anesthesia was also reviewed. The risks                            and benefits of the procedure and the sedation                            options and risks were discussed with the patient.  All questions were answered, and informed consent                            was obtained. Prior Anticoagulants: The patient has                            taken no previous anticoagulant or antiplatelet                            agents. ASA Grade Assessment: IV - A patient with                            severe systemic disease that is a  constant threat                            to life. After reviewing the risks and benefits,                            the patient was deemed in satisfactory condition to                            undergo the procedure.                           After obtaining informed consent, the colonoscope                            was passed under direct vision. Throughout the                            procedure, the patient's blood pressure, pulse, and                            oxygen saturations were monitored continuously. The                            CF-HQ190L ([id number]) Olympus colonoscope was                            introduced through the anus and advanced to the the                            ileocolonic anastomosis. The colonoscopy was                            performed without difficulty. The patient tolerated                            the procedure well. The quality of the bowel                            preparation was good. The rectum was photographed. Scope In: 10:19:52 AM Scope Out: 10:29:25 AM Scope Withdrawal Time: 0 hours 7 minutes 13 seconds  Total  Procedure Duration: 0 hours 9 minutes 33 seconds  Findings:      Normal end to side ileocolonic anastomosis.      The examination was otherwise normal. Impression:               - Normal end to side ileocolonic anastomosis.                           - No polyps or cancers. Moderate Sedation:      Not Applicable - Patient had care per Anesthesia. Recommendation:           - EGD now to continue IDA workup.                           - Repeat colonoscopy in 5 years for screening.                           - Return to the care of Linton Ham, MD                            Gastroenterology at [facility]. Procedure Code(s):        --- Professional ---                           [phone], Colonoscopy, flexible; diagnostic, including                            collection of specimen(s) by brushing or washing,                             when performed (separate procedure) Diagnosis Code(s):        --- Professional ---                           D50.9, Iron deficiency anemia, unspecified CPT copyright 2019 American Medical Association. All rights reserved. The codes documented in this report are preliminary and upon coder review may  be revised to meet current compliance requirements. Milus Banister, MD 06/10/2019 10:35:28 AM This report has been signed electronically. Number of Addenda: 0

## 2019-06-10 NOTE — Op Note (Signed)
[facility] Patient Name: [name] Procedure Date: 06/10/2019 MRN: [id number] Attending MD: Milus Banister , MD Date of Birth: 05/28/1951 CSN: [id number] Age: 69 Admit Type: Outpatient Procedure:                Upper GI endoscopy Indications:              Iron deficiency anemia; referred by Linton Ham,                            MD Gastroenterologist at [facility]. for                            'community care' coverage. Providers:                Milus Banister, MD, Cleda Daub, RN, Corie Chiquito, Technician, [facility], Technician, Caryl Pina CRNA Referring MD:             Linton Ham, MD Gastroenterologist at [facility]. Medicines:                Monitored Anesthesia Care Complications:            No immediate complications. Estimated blood loss:                            None. Estimated Blood Loss:     Estimated blood loss: none. Procedure:                Pre-Anesthesia Assessment:                           - Prior to the procedure, a History and Physical                            was performed, and patient medications and                            allergies were reviewed. The patient's tolerance of                            previous anesthesia was also reviewed. The risks                            and benefits of the procedure and the sedation                            options and risks were discussed with the patient.                            All questions were answered, and  informed consent                            was obtained. Prior Anticoagulants: The patient has                            taken no previous anticoagulant or antiplatelet                            agents. ASA Grade Assessment: IV - A patient with                            severe systemic disease that is a constant threat                            to life. After reviewing  the risks and benefits,                            the patient was deemed in satisfactory condition to                            undergo the procedure.                           After obtaining informed consent, the endoscope was                            passed under direct vision. Throughout the                            procedure, the patient's blood pressure, pulse, and                            oxygen saturations were monitored continuously. The                            [id number]-[id number] ([id number]) Olympus gastroscope was                            introduced through the mouth, and advanced to the                            second part of duodenum. The upper GI endoscopy was                            accomplished without difficulty. The patient                            tolerated the procedure well. Scope In: Scope Out: Findings:      A medium-sized hiatal hernia was present causing typical esophageal       foreshortening and resultant tortuosity.      The exam was otherwise without abnormality.      No signs of portal hypertension. Impression:               -  Medium-sized hiatal hernia.                           - The examination was otherwise normal.                           - No signs of portal hypertension despite                            documentation in 2017 Dr. Hassie Bruce Gastroenterology                            [facility].A. notes stating that he had                            cirrhosis.                           - No obvious cause of iron defiency anemia on                            today's tests (colonoscopy and EGD). Most likely it                            is due to your signficant renal insufficiency. Moderate Sedation:      Not Applicable - Patient had care per Anesthesia. Recommendation:           - Patient has a contact number available for                            emergencies. The signs and symptoms of potential                            delayed  complications were discussed with the                            patient. Return to normal activities tomorrow.                            Written discharge instructions were provided to the                            patient.                           - Resume previous diet.                           - Continue present medications (especially once                            daily iron supplement).                           - Follow up with Gastroenterology at [facility] Dr. Hassie Bruce.                           - Follow up with your nephrologist. Procedure Code(s):        --- Professional ---                           [phone], Esophagogastroduodenoscopy, flexible,                            transoral; diagnostic, including collection of                            specimen(s) by brushing or washing, when performed                            (separate procedure) Diagnosis Code(s):        --- Professional ---                           K44.9, Diaphragmatic hernia without obstruction or                            gangrene                           D50.9, Iron deficiency anemia, unspecified CPT copyright 2019 American Medical Association. All rights reserved. The codes documented in this report are preliminary and upon coder review may  be revised to meet current compliance requirements. Milus Banister, MD 06/10/2019 10:48:03 AM This report has been signed electronically. Number of Addenda: 0

## 2019-06-10 NOTE — Anesthesia Postprocedure Evaluation (Signed)
Anesthesia Post Note  Patient: Ryheem Jay.  Procedure(s) Performed: COLONOSCOPY WITH PROPOFOL (N/A ) ESOPHAGOGASTRODUODENOSCOPY (EGD) WITH PROPOFOL (N/A )     Patient location during evaluation: Endoscopy Anesthesia Type: MAC Level of consciousness: awake and alert, patient cooperative and oriented Pain management: pain level controlled Vital Signs Assessment: post-procedure vital signs reviewed and stable Respiratory status: spontaneous breathing, nonlabored ventilation and respiratory function stable Cardiovascular status: stable and blood pressure returned to baseline Postop Assessment: no apparent nausea or vomiting and able to ambulate Anesthetic complications: no    Last Vitals:  Vitals:   06/10/19 1100 06/10/19 1110  BP: 139/69 (!) 161/68  Pulse: (!) 57 61  Resp: 20 (!) 26  Temp:    SpO2: 100% 100%    Last Pain:  Vitals:   06/10/19 1110  TempSrc:   PainSc: 0-No pain                 Kaysey Berndt,E. Amelianna Meller

## 2019-06-10 NOTE — Discharge Instructions (Signed)
YOU HAD AN ENDOSCOPIC PROCEDURE TODAY: Refer to the procedure report and other information in the discharge instructions given to you for any specific questions about what was found during the examination. If this information does not answer your questions, please call [place] office at [phone] to clarify.  ° °YOU SHOULD EXPECT: Some feelings of bloating in the abdomen. Passage of more gas than usual. Walking can help get rid of the air that was put into your GI tract during the procedure and reduce the bloating. If you had a lower endoscopy (such as a colonoscopy or flexible sigmoidoscopy) you may notice spotting of blood in your stool or on the toilet paper. Some abdominal soreness may be present for a day or two, also. ° °DIET: Your first meal following the procedure should be a light meal and then it is ok to progress to your normal diet. A half-sandwich or bowl of soup is an example of a good first meal. Heavy or fried foods are harder to digest and may make you feel nauseous or bloated. Drink plenty of fluids but you should avoid alcoholic beverages for 24 hours. If you had a esophageal dilation, please see attached instructions for diet.   ° °ACTIVITY: Your care partner should take you home directly after the procedure. You should plan to take it easy, moving slowly for the rest of the day. You can resume normal activity the day after the procedure however YOU SHOULD NOT DRIVE, use power tools, machinery or perform tasks that involve climbing or major physical exertion for 24 hours (because of the sedation medicines used during the test).  ° °SYMPTOMS TO REPORT IMMEDIATELY: °A gastroenterologist can be reached at any hour. Please call [phone]  for any of the following symptoms:  °Following lower endoscopy (colonoscopy, flexible sigmoidoscopy) °Excessive amounts of blood in the stool  °Significant tenderness, worsening of abdominal pains  °Swelling of the abdomen that is new, acute  °Fever of 100° or  higher  °Following upper endoscopy (EGD, EUS, ERCP, esophageal dilation) °Vomiting of blood or coffee ground material  °New, significant abdominal pain  °New, significant chest pain or pain under the shoulder blades  °Painful or persistently difficult swallowing  °New shortness of breath  °Black, tarry-looking or red, bloody stools ° °FOLLOW UP:  °If any biopsies were taken you will be contacted by phone or by letter within the next 1-3 weeks. Call [phone]  if you have not heard about the biopsies in 3 weeks.  °Please also call with any specific questions about appointments or follow up tests. ° °

## 2019-06-10 NOTE — H&P (Signed)
HPI: This is a man referred by a Copywriter, advertising at the [facility] for a colonoscopy/EGD to workup his IDA.  Chief complaint is IDA  ROS: complete GI ROS as described in HPI, all other review negative.  Constitutional:  No unintentional weight loss   Past Medical History:  Diagnosis Date  . Anemia   . Arthritis    left knee  . Chronic kidney disease    acute renal failure/injury requiring short-term HD 2013  . Colon cancer ([place])   . Colon polyp    11/2011 - Polyps identified, biopsy - invasive adenocarinoma  . DM II (diabetes mellitus, type II), controlled ([place])    type 2 IDDM x 15 years. A1C 1/09 13.7.   Marland Kitchen 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 12/25/2011  . GERD (gastroesophageal reflux disease)   . Heart murmur   . 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 Hyperlipidemia   . Hypertension    for a few years and resolved in 2013/2014.  . Sleep apnea    does not wear CPAP now    Past Surgical History:  Procedure Laterality Date  . APPLICATION OF WOUND VAC  02/16/2012   Procedure: APPLICATION OF WOUND VAC;  Surgeon: Zenovia Jarred, MD;  Location: [place];  Service: General;  Laterality: N/A;  . APPLICATION OF WOUND VAC  02/26/2012   Procedure: APPLICATION OF WOUND VAC;  Surgeon: Imogene Burn. [name], MD;  Location: [place];  Service: General;  Laterality: N/A;  . COLONOSCOPY    . COLONOSCOPY  02/06/2012   Procedure: COLONOSCOPY;  Surgeon: Milus Banister, MD;  Location: [place];  Service: Endoscopy;;  . COLOSTOMY REVERSAL    . DRESSING CHANGE UNDER ANESTHESIA  02/18/2012   Procedure: DRESSING CHANGE UNDER ANESTHESIA;  Surgeon: Odis Hollingshead, MD;  Location: [place];  Service: General;  Laterality: N/A;  . GANGLION CYST EXCISION  20 YRS AGO   RT ARM  . ILEOSTOMY  02/16/2012   Procedure: ILEOSTOMY;  Surgeon: Zenovia Jarred, MD;  Location: [place];  Service: General;  Laterality: N/A;  . ILEOSTOMY CLOSURE N/A 09/02/2012   Procedure: ILEOSTOMY REVERSAL;  Surgeon: Imogene Burn. [name],  MD;  Location: [place];  Service: General;  Laterality: N/A;  . ILIOSTOMY    . INCISION AND DRAINAGE OF WOUND  02/23/2012   Procedure: IRRIGATION AND DEBRIDEMENT WOUND;  Surgeon: Joyice Faster. Cornett, MD;  Location: [place];  Service: General;  Laterality: N/A;  . LAPAROTOMY  02/16/2012   Procedure: EXPLORATORY LAPAROTOMY;  Surgeon: Zenovia Jarred, MD;  Location: [facility];  Service: General;  Laterality: N/A;  Exploratory Laparotomy with resection of anastomosis  . LAPAROTOMY  02/18/2012   Procedure: EXPLORATORY LAPAROTOMY;  Surgeon: Odis Hollingshead, MD;  Location: [place];  Service: General;  Laterality: N/A;  exploratory laparotomy  change of abdominal vac dressing  . LAPAROTOMY  02/20/2012   Procedure: EXPLORATORY LAPAROTOMY;  Surgeon: Odis Hollingshead, MD;  Location: [place];  Service: General;  Laterality: N/A;  . LAPAROTOMY  02/23/2012   Procedure: EXPLORATORY LAPAROTOMY;  Surgeon: Joyice Faster. Cornett, MD;  Location: [place];  Service: General;  Laterality: N/A;  Irrigation and Debridement of abdominal wound with wound vac change with partial closure  . LAPAROTOMY  02/26/2012   Procedure: EXPLORATORY LAPAROTOMY;  Surgeon: Imogene Burn. [name], MD;  Location: [place];  Service: General;  Laterality: N/A;     . PARTIAL COLECTOMY  02/06/2012  . PARTIAL COLECTOMY  02/06/2012   Procedure: PARTIAL  COLECTOMY;  Surgeon: Imogene Burn. [name], MD;  Location: [place];  Service: General;  Laterality: N/A;  right partial colectomy  . RESECTION SMALL BOWEL / CLOSURE ILEOSTOMY  402/2014   Dr [name]  . VACUUM ASSISTED CLOSURE CHANGE  02/20/2012   Procedure: ABDOMINAL VACUUM ASSISTED CLOSURE CHANGE;  Surgeon: Odis Hollingshead, MD;  Location: [place];  Service: General;;  . VACUUM ASSISTED CLOSURE CHANGE  02/23/2012   Procedure: ABDOMINAL VACUUM ASSISTED CLOSURE CHANGE;  Surgeon: Joyice Faster. Cornett, MD;  Location: [place] [facility];  Service: General;  Laterality: N/A;    Current Facility-Administered Medications  Medication Dose Route Frequency  Provider Last Rate Last Admin  . 0.9 %  sodium chloride infusion   Intravenous Continuous Milus Banister, MD        Allergies as of 05/04/2019  . (No Known Allergies)    Family History  Problem Relation Age of Onset  . Diabetes Father   . Heart disease Father   . Hypertension Mother   . Diabetes Sister   . Heart disease Sister   . Colon cancer Neg Hx     Social History   Socioeconomic History  . Marital status: Divorced    Spouse name: Not on file  . Number of children: 3  . Years of education: Not on file  . Highest education level: Not on file  Occupational History  . Occupation: retired/disabled    Employer: [employer]    Comment: Worked at [employer]  . Smoking status: Current Every Day Smoker    Packs/day: 0.50    Years: 30.00    Pack years: 15.00    Types: Cigarettes  . Smokeless tobacco: Never Used  Substance and Sexual Activity  . Alcohol use: Yes    Comment: Liquor twice monthly.  . Drug use: No  . Sexual activity: Not on file  Other Topics Concern  . Not on file  Social History Narrative   Financial assistance approved for 100% discount at [facility] and has [facility] card   [employer]  August 08, 2009 5:48 PM   *PATIENT WAS GIVEN DM CARD.Lela Sturdivant NT II  June 08, 2010 3:56 PM      Divorced, one daughter currently staying with him   Has 7 grand children   Reports disability and medicaid is pending   Social Determinants of Health   Financial Resource Strain:   . Difficulty of Paying Living Expenses: Not on file  Food Insecurity:   . Worried About Charity fundraiser in the Last Year: Not on file  . Ran Out of Food in the Last Year: Not on file  Transportation Needs:   . Lack of Transportation (Medical): Not on file  . Lack of Transportation (Non-Medical): Not on file  Physical Activity:   . Days of Exercise per Week: Not on file  . Minutes of Exercise per Session: Not on file  Stress:   . Feeling of Stress : Not on file  Social  Connections:   . Frequency of Communication with Friends and Family: Not on file  . Frequency of Social Gatherings with Friends and Family: Not on file  . Attends Religious Services: Not on file  . Active Member of Clubs or Organizations: Not on file  . Attends Archivist Meetings: Not on file  . Marital Status: Not on file  Intimate Partner Violence:   . Fear of Current or Ex-Partner: Not on file  . Emotionally Abused:  Not on file  . Physically Abused: Not on file  . Sexually Abused: Not on file     Physical Exam: There were no vitals taken for this visit. Constitutional: generally well-appearing Psychiatric: alert and oriented x3 Abdomen: soft, nontender, nondistended, no obvious ascites, no peritoneal signs, normal bowel sounds No peripheral edema noted in lower extremities  Assessment and plan: 69 y.o. male with IDA  For colonoscopy and EGD today. I will send these results to his [facility] gastroenterologist that referred him for these procedures.  Please see the "Patient Instructions" section for addition details about the plan.  Owens Loffler, MD [facility] 06/10/2019, 9:14 AM

## 2019-06-10 NOTE — Anesthesia Preprocedure Evaluation (Addendum)
Anesthesia Evaluation  Patient identified by MRN, date of birth, ID band Patient awake    Reviewed: Allergy & Precautions, NPO status , Patient's Chart, lab work & pertinent test results, reviewed documented beta blocker date and time   History of Anesthesia Complications Negative for: history of anesthetic complications  Airway Mallampati: I  TM Distance: >3 FB Neck ROM: Full    Dental  (+) Edentulous Upper, Poor Dentition, Missing, Dental Advisory Given, Chipped   Pulmonary sleep apnea (no longer uses CPAP) , Current Smoker and Patient abstained from smoking.,  06/07/2019 SARS CoV2 NEG   breath sounds clear to auscultation       Cardiovascular hypertension, Pt. on medications and Pt. on home beta blockers (-) angina Rhythm:Regular Rate:Normal     Neuro/Psych negative neurological ROS     GI/Hepatic Neg liver ROS, GERD  Medicated and Controlled,H/o colon cancer   Endo/Other  diabetes (glu 177), Insulin Dependent  Renal/GU Renal InsufficiencyRenal disease (K+ 4.2, not on dialysis yet)     Musculoskeletal   Abdominal (+) + obese,   Peds  Hematology  (+) Blood dyscrasia (Hb 8.2), anemia ,   Anesthesia Other Findings   Reproductive/Obstetrics                            Anesthesia Physical Anesthesia Plan  ASA: III  Anesthesia Plan: MAC   Post-op Pain Management:    Induction:   PONV Risk Score and Plan: 0  Airway Management Planned: Natural Airway and Simple Face Mask  Additional Equipment:   Intra-op Plan:   Post-operative Plan:   Informed Consent: I have reviewed the patients History and Physical, chart, labs and discussed the procedure including the risks, benefits and alternatives for the proposed anesthesia with the patient or authorized representative who has indicated his/her understanding and acceptance.     Dental advisory given  Plan Discussed with: CRNA and  Surgeon  Anesthesia Plan Comments:        Anesthesia Quick Evaluation

## 2019-06-10 NOTE — Transfer of Care (Signed)
Immediate Anesthesia Transfer of Care Note  Patient: [name].  Procedure(s) Performed: COLONOSCOPY WITH PROPOFOL (N/A ) ESOPHAGOGASTRODUODENOSCOPY (EGD) WITH PROPOFOL (N/A )  Patient Location: Endoscopy Unit  Anesthesia Type:MAC  Level of Consciousness: drowsy and responds to stimulation  Airway & Oxygen Therapy: Patient Spontanous Breathing and Patient connected to face mask oxygen  Post-op Assessment: Report given to RN, Post -op Vital signs reviewed and stable and Patient moving all extremities  Post vital signs: Reviewed and stable  Last Vitals:  Vitals Value Taken Time  BP    Temp    Pulse    Resp    SpO2      Last Pain:  Vitals:   06/10/19 0924  TempSrc: Oral  PainSc: 0-No pain         Complications: No apparent anesthesia complications

## 2019-06-11 ENCOUNTER — Encounter: Payer: Self-pay | Admitting: *Deleted

## 2019-07-19 ENCOUNTER — Telehealth (HOSPITAL_COMMUNITY): Payer: Self-pay

## 2019-07-19 ENCOUNTER — Other Ambulatory Visit: Payer: Self-pay

## 2019-07-19 DIAGNOSIS — N186 End stage renal disease: Secondary | ICD-10-CM

## 2019-07-19 NOTE — Telephone Encounter (Signed)

## 2019-07-20 ENCOUNTER — Encounter: Payer: Self-pay | Admitting: Vascular Surgery

## 2019-07-20 ENCOUNTER — Ambulatory Visit (INDEPENDENT_AMBULATORY_CARE_PROVIDER_SITE_OTHER): Payer: No Typology Code available for payment source | Admitting: Vascular Surgery

## 2019-07-20 ENCOUNTER — Other Ambulatory Visit: Payer: Self-pay

## 2019-07-20 ENCOUNTER — Ambulatory Visit (HOSPITAL_COMMUNITY)
Admission: RE | Admit: 2019-07-20 | Discharge: 2019-07-20 | Disposition: A | Discharge status: Home / Self Care | Payer: No Typology Code available for payment source | Source: Ambulatory Visit | Attending: Vascular Surgery | Admitting: Vascular Surgery

## 2019-07-20 ENCOUNTER — Ambulatory Visit (INDEPENDENT_AMBULATORY_CARE_PROVIDER_SITE_OTHER)
Admission: RE | Admit: 2019-07-20 | Discharge: 2019-07-20 | Disposition: A | Discharge status: Home / Self Care | Payer: No Typology Code available for payment source | Source: Ambulatory Visit | Attending: Vascular Surgery | Admitting: Vascular Surgery

## 2019-07-20 VITALS — BP 152/82 | HR 70 | Temp 97.9°F | Resp 18 | Ht 71.5 in | Wt 227.0 lb

## 2019-07-20 DIAGNOSIS — N186 End stage renal disease: Secondary | ICD-10-CM

## 2019-07-20 NOTE — Progress Notes (Signed)
Patient name: [name]. MRN: [id number] DOB: 1950/10/28 Sex: male  REASON FOR CONSULT: Evaluate permanent dialysis access  HPI: Costas Imani Sherrin. is a 69 y.o. male, with end-stage renal disease, hypertension, hyperlipidemia, diabetes that presents for evaluation of permanent dialysis access.  Patient states he is currently dialyzing on Monday Wednesdays and Fridays via catheter in his right IJ.  He denies any upper extremity AV fistula access in the past.  He is right-handed.  Denies any numbness or tingling in his upper extremities.  States he is retired no longer working.  Past Medical History:  Diagnosis Date  . Anemia   . Arthritis    left knee  . Chronic kidney disease    acute renal failure/injury requiring short-term HD 2013  . Colon cancer ([place])   . Colon polyp    11/2011 - Polyps identified, biopsy - invasive adenocarinoma  . DM II (diabetes mellitus, type II), controlled ([place])    type 2 IDDM x 15 years. A1C 1/09 13.7.   Marland Kitchen 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 12/25/2011  . GERD (gastroesophageal reflux disease)   . Heart murmur   . 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 Hyperlipidemia   . Hypertension    for a few years and resolved in 2013/2014.  . Sleep apnea    does not wear CPAP now    Past Surgical History:  Procedure Laterality Date  . APPLICATION OF WOUND VAC  02/16/2012   Procedure: APPLICATION OF WOUND VAC;  Surgeon: Zenovia Jarred, MD;  Location: [place];  Service: General;  Laterality: N/A;  . APPLICATION OF WOUND VAC  02/26/2012   Procedure: APPLICATION OF WOUND VAC;  Surgeon: Imogene Burn. [name], MD;  Location: [place];  Service: General;  Laterality: N/A;  . COLONOSCOPY    . COLONOSCOPY  02/06/2012   Procedure: COLONOSCOPY;  Surgeon: Milus Banister, MD;  Location: [place];  Service: Endoscopy;;  . COLONOSCOPY WITH PROPOFOL N/A 06/10/2019   Procedure: COLONOSCOPY WITH PROPOFOL;  Surgeon: Milus Banister, MD;  Location: [facility];  Service:  Endoscopy;  Laterality: N/A;  . COLOSTOMY REVERSAL    . DRESSING CHANGE UNDER ANESTHESIA  02/18/2012   Procedure: DRESSING CHANGE UNDER ANESTHESIA;  Surgeon: Odis Hollingshead, MD;  Location: [place];  Service: General;  Laterality: N/A;  . ESOPHAGOGASTRODUODENOSCOPY (EGD) WITH PROPOFOL N/A 06/10/2019   Procedure: ESOPHAGOGASTRODUODENOSCOPY (EGD) WITH PROPOFOL;  Surgeon: Milus Banister, MD;  [facility];  Service: Endoscopy;  Laterality: N/A;  . GANGLION CYST EXCISION  20 YRS AGO   RT ARM  . ILEOSTOMY  02/16/2012   Procedure: ILEOSTOMY;  Surgeon: Zenovia Jarred, MD;  Location: [place];  Service: General;  Laterality: N/A;  . ILEOSTOMY CLOSURE N/A 09/02/2012   Procedure: ILEOSTOMY REVERSAL;  Surgeon: Imogene Burn. [name], MD;  Location: [place];  Service: General;  Laterality: N/A;  . ILIOSTOMY    . INCISION AND DRAINAGE OF WOUND  02/23/2012   Procedure: IRRIGATION AND DEBRIDEMENT WOUND;  Surgeon: Joyice Faster. Cornett, MD;  Location: [place];  Service: General;  Laterality: N/A;  . LAPAROTOMY  02/16/2012   Procedure: EXPLORATORY LAPAROTOMY;  Surgeon: Zenovia Jarred, MD;  Location: [facility];  Service: General;  Laterality: N/A;  Exploratory Laparotomy with resection of anastomosis  . LAPAROTOMY  02/18/2012   Procedure: EXPLORATORY LAPAROTOMY;  Surgeon: Odis Hollingshead, MD;  Location: [place];  Service: General;  Laterality: N/A;  exploratory laparotomy  change of abdominal vac  dressing  . LAPAROTOMY  02/20/2012   Procedure: EXPLORATORY LAPAROTOMY;  Surgeon: Odis Hollingshead, MD;  Location: [place];  Service: General;  Laterality: N/A;  . LAPAROTOMY  02/23/2012   Procedure: EXPLORATORY LAPAROTOMY;  Surgeon: Joyice Faster. Cornett, MD;  Location: [place];  Service: General;  Laterality: N/A;  Irrigation and Debridement of abdominal wound with wound vac change with partial closure  . LAPAROTOMY  02/26/2012   Procedure: EXPLORATORY LAPAROTOMY;  Surgeon: Imogene Burn. [name], MD;  Location: [place];  Service: General;   Laterality: N/A;     . PARTIAL COLECTOMY  02/06/2012  . PARTIAL COLECTOMY  02/06/2012   Procedure: PARTIAL COLECTOMY;  Surgeon: Imogene Burn. [name], MD;  Location: [place];  Service: General;  Laterality: N/A;  right partial colectomy  . RESECTION SMALL BOWEL / CLOSURE ILEOSTOMY  402/2014   Dr [name]  . VACUUM ASSISTED CLOSURE CHANGE  02/20/2012   Procedure: ABDOMINAL VACUUM ASSISTED CLOSURE CHANGE;  Surgeon: Odis Hollingshead, MD;  Location: [place];  Service: General;;  . VACUUM ASSISTED CLOSURE CHANGE  02/23/2012   Procedure: ABDOMINAL VACUUM ASSISTED CLOSURE CHANGE;  Surgeon: Joyice Faster. Cornett, MD;  Location: [place] [facility];  Service: General;  Laterality: N/A;    Family History  Problem Relation Age of Onset  . Diabetes Father   . Heart disease Father   . Hypertension Mother   . Diabetes Sister   . Heart disease Sister   . Colon cancer Neg Hx     SOCIAL HISTORY: Social History   Socioeconomic History  . Marital status: Divorced    Spouse name: Not on file  . Number of children: 3  . Years of education: Not on file  . Highest education level: Not on file  Occupational History  . Occupation: retired/disabled    Employer: [employer]    Comment: Worked at [employer]  . Smoking status: Current Every Day Smoker    Packs/day: 0.50    Years: 30.00    Pack years: 15.00    Types: Cigarettes  . Smokeless tobacco: Never Used  Substance and Sexual Activity  . Alcohol use: Yes    Comment: Liquor twice monthly.  . Drug use: No  . Sexual activity: Not on file  Other Topics Concern  . Not on file  Social History Narrative   Financial assistance approved for 100% discount at [facility] and has [facility] card   [employer]  August 08, 2009 5:48 PM   *PATIENT WAS GIVEN DM CARD.Lela Sturdivant NT II  June 08, 2010 3:56 PM      Divorced, one daughter currently staying with him   Has 7 grand children   Reports disability and medicaid is pending   Social Determinants of Health   Financial  Resource Strain:   . Difficulty of Paying Living Expenses: Not on file  Food Insecurity:   . Worried About Charity fundraiser in the Last Year: Not on file  . Ran Out of Food in the Last Year: Not on file  Transportation Needs:   . Lack of Transportation (Medical): Not on file  . Lack of Transportation (Non-Medical): Not on file  Physical Activity:   . Days of Exercise per Week: Not on file  . Minutes of Exercise per Session: Not on file  Stress:   . Feeling of Stress : Not on file  Social Connections:   . Frequency of Communication with Friends and Family: Not on file  .  Frequency of Social Gatherings with Friends and Family: Not on file  . Attends Religious Services: Not on file  . Active Member of Clubs or Organizations: Not on file  . Attends Archivist Meetings: Not on file  . Marital Status: Not on file  Intimate Partner Violence:   . Fear of Current or Ex-Partner: Not on file  . Emotionally Abused: Not on file  . Physically Abused: Not on file  . Sexually Abused: Not on file    No Known Allergies  Current Outpatient Medications  Medication Sig Dispense Refill  . amLODipine (NORVASC) 10 MG tablet Take 10 mg by mouth daily.    Marland Kitchen atorvastatin (LIPITOR) 20 MG tablet Take 20 mg by mouth daily.    . ferrous sulfate 325 (65 FE) MG tablet Take 325 mg by mouth daily with breakfast.    . insulin detemir (LEVEMIR) 100 UNIT/ML injection Inject 0.24 mLs (24 Units total) into the skin at bedtime. (Patient taking differently: Inject 26 Units into the skin daily. ) 10 mL 11  . metoprolol tartrate (LOPRESSOR) 50 MG tablet Take 50 mg by mouth daily.     . Omega-3 Fatty Acids (FISH OIL) 1000 MG CPDR Take 1,000 mg by mouth daily.     Marland Kitchen omeprazole (PRILOSEC) 20 MG capsule Take 20 mg by mouth daily.    . vitamin B-12 (CYANOCOBALAMIN) 1000 MCG tablet Take 1,000 mcg by mouth daily.     No current facility-administered medications for this visit.    REVIEW OF SYSTEMS:  [X]   denotes positive finding, [ ]  denotes negative finding Cardiac  Comments:  Chest pain or chest pressure:    Shortness of breath upon exertion:    Short of breath when lying flat:    Irregular heart rhythm:        Vascular    Pain in calf, thigh, or hip brought on by ambulation:    Pain in feet at night that wakes you up from your sleep:     Blood clot in your veins:    Leg swelling:         Pulmonary    Oxygen at home:    Productive cough:     Wheezing:         Neurologic    Sudden weakness in arms or legs:     Sudden numbness in arms or legs:     Sudden onset of difficulty speaking or slurred speech:    Temporary loss of vision in one eye:     Problems with dizziness:         Gastrointestinal    Blood in stool:     Vomited blood:         Genitourinary    Burning when urinating:     Blood in urine:        Psychiatric    Major depression:         Hematologic    Bleeding problems:    Problems with blood clotting too easily:        Skin    Rashes or ulcers:        Constitutional    Fever or chills:      PHYSICAL EXAM: Vitals:   07/20/19 1050  BP: (!) 152/82  Pulse: 70  Resp: 18  Temp: 97.9 F (36.6 C)  TempSrc: Temporal  SpO2: 98%  Weight: 227 lb (103 kg)  Height: 5' 11.5" (1.816 m)    GENERAL: The patient is a well-nourished male,  in no acute distress. The vital signs are documented above. CARDIAC: There is a regular rate and rhythm.  VASCULAR:  2+ palpable radial brachial pulse bilateral upper extremity. No upper extremity tissue loss. PULMONARY: There is good air exchange bilaterally without wheezing or rales. ABDOMEN: Soft and non-tender with normal pitched bowel sounds.  MUSCULOSKELETAL: There are no major deformities or cyanosis. NEUROLOGIC: No focal weakness or paresthesias are detected. SKIN: There are no ulcers or rashes noted. PSYCHIATRIC: The patient has a normal affect.  DATA:   Independently reviewed arterial duplex and triphasic  waveforms in both upper extremities.  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  Independently reviewed bilateral upper extremity vein mapping and in his nondominant arm he has a very nice cephalic and basilic vein that appears usable at the elbow.  Assessment/Plan:  69 year old male with end-stage renal disease presents for permanent dialysis access evaluation.  Discussed plan for placement of fistula in the nondominant arm which would be his left arm.  He has a nice palpable radial brachial pulse in the left arm and a very nice cephalic vein at the level of the elbow would likely be a candidate for a brachiocephalic.  Noted duplicate artery on duplex.  Discussed risk benefits including risk of bleeding, infection, steal, and failure to mature.  Ultimately offered to schedule his surgery today but he wants to discuss with his daughter will call back to arrange whenever he can have support to bring him to the hospital and take him home.   Marty Heck, MD [facility] Office: [phone]

## 2019-07-27 ENCOUNTER — Other Ambulatory Visit (HOSPITAL_COMMUNITY)
Admission: RE | Admit: 2019-07-27 | Discharge: 2019-07-27 | Disposition: A | Discharge status: Home / Self Care | Payer: Medicare Other | Source: Ambulatory Visit | Attending: Vascular Surgery | Admitting: Vascular Surgery

## 2019-07-27 DIAGNOSIS — Z20822 Contact with and (suspected) exposure to covid-19: Secondary | ICD-10-CM | POA: Insufficient documentation

## 2019-07-27 DIAGNOSIS — Z01812 Encounter for preprocedural laboratory examination: Secondary | ICD-10-CM | POA: Insufficient documentation

## 2019-07-27 LAB — SARS CORONAVIRUS 2 (TAT 6-24 HRS): SARS Coronavirus 2: NEGATIVE

## 2019-07-28 ENCOUNTER — Other Ambulatory Visit: Payer: Self-pay

## 2019-07-28 ENCOUNTER — Encounter (HOSPITAL_COMMUNITY): Payer: Self-pay | Admitting: Vascular Surgery

## 2019-07-28 NOTE — Progress Notes (Signed)
Pt denies SOB, chest pain, and being under the care of a cardiologist. Pt denies having a stress test echo and cardiac cath. Pt made aware to stop taking vitamins, fish oil, Cod Liver oil and herbal medications. Do not take any NSAIDs ie: Ibuprofen, Advil, Naproxen (Aleve), Motrin, BC and Goody Powder. Pt reminded to quarantine. Pt verbalized understanding of all pre-op instructions.

## 2019-07-28 NOTE — Progress Notes (Signed)
Pt made aware to take 13 units of Levemir insulin at HS. Pt stated that he does not have the supplies to check CBG on DOS.

## 2019-07-29 ENCOUNTER — Other Ambulatory Visit: Payer: Self-pay

## 2019-07-29 ENCOUNTER — Ambulatory Visit (HOSPITAL_COMMUNITY): Payer: No Typology Code available for payment source | Admitting: Anesthesiology

## 2019-07-29 ENCOUNTER — Encounter (HOSPITAL_COMMUNITY): Payer: Self-pay | Admitting: Vascular Surgery

## 2019-07-29 ENCOUNTER — Encounter (HOSPITAL_COMMUNITY)
Admission: RE | Disposition: A | Discharge status: Home / Self Care | Payer: Self-pay | Source: Home / Self Care | Attending: Vascular Surgery

## 2019-07-29 ENCOUNTER — Ambulatory Visit (HOSPITAL_COMMUNITY)
Admission: RE | Admit: 2019-07-29 | Discharge: 2019-07-29 | Disposition: A | Discharge status: Home / Self Care | Payer: No Typology Code available for payment source | Attending: Vascular Surgery | Admitting: Vascular Surgery

## 2019-07-29 DIAGNOSIS — Z833 Family history of diabetes mellitus: Secondary | ICD-10-CM | POA: Insufficient documentation

## 2019-07-29 DIAGNOSIS — F17210 Nicotine dependence, cigarettes, uncomplicated: Secondary | ICD-10-CM | POA: Insufficient documentation

## 2019-07-29 DIAGNOSIS — Z8249 Family history of ischemic heart disease and other diseases of the circulatory system: Secondary | ICD-10-CM | POA: Diagnosis not present

## 2019-07-29 DIAGNOSIS — I120 Hypertensive chronic kidney disease with stage 5 chronic kidney disease or end stage renal disease: Secondary | ICD-10-CM | POA: Insufficient documentation

## 2019-07-29 DIAGNOSIS — G4730 Sleep apnea, unspecified: Secondary | ICD-10-CM | POA: Insufficient documentation

## 2019-07-29 DIAGNOSIS — Z932 Ileostomy status: Secondary | ICD-10-CM | POA: Diagnosis not present

## 2019-07-29 DIAGNOSIS — Z79899 Other long term (current) drug therapy: Secondary | ICD-10-CM | POA: Insufficient documentation

## 2019-07-29 DIAGNOSIS — K219 Gastro-esophageal reflux disease without esophagitis: Secondary | ICD-10-CM | POA: Diagnosis not present

## 2019-07-29 DIAGNOSIS — N186 End stage renal disease: Secondary | ICD-10-CM | POA: Diagnosis not present

## 2019-07-29 DIAGNOSIS — M1712 Unilateral primary osteoarthritis, left knee: Secondary | ICD-10-CM | POA: Diagnosis not present

## 2019-07-29 DIAGNOSIS — E1122 Type 2 diabetes mellitus with diabetic chronic kidney disease: Secondary | ICD-10-CM | POA: Diagnosis not present

## 2019-07-29 DIAGNOSIS — Z794 Long term (current) use of insulin: Secondary | ICD-10-CM | POA: Diagnosis not present

## 2019-07-29 DIAGNOSIS — Z9049 Acquired absence of other specified parts of digestive tract: Secondary | ICD-10-CM | POA: Insufficient documentation

## 2019-07-29 DIAGNOSIS — E785 Hyperlipidemia, unspecified: Secondary | ICD-10-CM | POA: Insufficient documentation

## 2019-07-29 DIAGNOSIS — Z992 Dependence on renal dialysis: Secondary | ICD-10-CM | POA: Diagnosis not present

## 2019-07-29 DIAGNOSIS — D649 Anemia, unspecified: Secondary | ICD-10-CM | POA: Diagnosis not present

## 2019-07-29 HISTORY — DX: Presence of spectacles and contact lenses: Z97.3

## 2019-07-29 HISTORY — PX: AV FISTULA PLACEMENT: SHX1204

## 2019-07-29 LAB — POCT I-STAT, CHEM 8
BUN: 20 mg/dL (ref 8–23)
Calcium, Ion: 1 mmol/L — ABNORMAL LOW (ref 1.15–1.40)
Chloride: 97 mmol/L — ABNORMAL LOW (ref 98–111)
Creatinine, Ser: 5.2 mg/dL — ABNORMAL HIGH (ref 0.61–1.24)
Glucose, Bld: 101 mg/dL — ABNORMAL HIGH (ref 70–99)
HCT: 33 % — ABNORMAL LOW (ref 39.0–52.0)
Hemoglobin: 11.2 g/dL — ABNORMAL LOW (ref 13.0–17.0)
Potassium: 3.2 mmol/L — ABNORMAL LOW (ref 3.5–5.1)
Sodium: 139 mmol/L (ref 135–145)
TCO2: 32 mmol/L (ref 22–32)

## 2019-07-29 LAB — GLUCOSE, CAPILLARY
Glucose-Capillary: 99 mg/dL (ref 70–99)
Glucose-Capillary: 144 mg/dL — ABNORMAL HIGH (ref 70–99)
Glucose-Capillary: 120 mg/dL — ABNORMAL HIGH (ref 70–99)

## 2019-07-29 SURGERY — ARTERIOVENOUS (AV) FISTULA CREATION
Anesthesia: Regional | Laterality: Left

## 2019-07-29 MED ORDER — CHLORHEXIDINE GLUCONATE 4 % EX LIQD: 60.0000 mL | Freq: Once | CUTANEOUS | Status: DC

## 2019-07-29 MED ORDER — MIDAZOLAM HCL 2 MG/2ML IJ SOLN
INTRAMUSCULAR | Status: AC
  Filled 2019-07-29: qty 2

## 2019-07-29 MED ORDER — FENTANYL CITRATE (PF) 250 MCG/5ML IJ SOLN
INTRAMUSCULAR | Status: AC
  Filled 2019-07-29: qty 5

## 2019-07-29 MED ORDER — HYDROCODONE-ACETAMINOPHEN 5-325 MG PO TABS: 1.0000 | ORAL_TABLET | ORAL | 0 refills | Status: DC | PRN

## 2019-07-29 MED ORDER — SODIUM CHLORIDE 0.9 % IV SOLN
INTRAVENOUS | Status: AC
  Filled 2019-07-29: qty 1.2

## 2019-07-29 MED ORDER — SODIUM CHLORIDE 0.9 % IV SOLN: INTRAVENOUS | Status: DC

## 2019-07-29 MED ORDER — LIDOCAINE-EPINEPHRINE (PF) 1.5 %-1:200000 IJ SOLN
INTRAMUSCULAR | Status: DC | PRN
  Administered 2019-07-29: 30 mL via PERINEURAL

## 2019-07-29 MED ORDER — HEPARIN SODIUM (PORCINE) 1000 UNIT/ML IJ SOLN
INTRAMUSCULAR | Status: DC | PRN
  Administered 2019-07-29: 3000 [IU] via INTRAVENOUS

## 2019-07-29 MED ORDER — CLONIDINE HCL (ANALGESIA) 100 MCG/ML EP SOLN
EPIDURAL | Status: DC | PRN
  Administered 2019-07-29: 100 ug

## 2019-07-29 MED ORDER — HEPARIN SODIUM (PORCINE) 1000 UNIT/ML IJ SOLN
INTRAMUSCULAR | Status: AC
  Filled 2019-07-29: qty 1

## 2019-07-29 MED ORDER — ONDANSETRON HCL 4 MG/2ML IJ SOLN
INTRAMUSCULAR | Status: AC
  Filled 2019-07-29: qty 2

## 2019-07-29 MED ORDER — FENTANYL CITRATE (PF) 100 MCG/2ML IJ SOLN: 25.0000 ug | INTRAMUSCULAR | Status: DC | PRN

## 2019-07-29 MED ORDER — MIDAZOLAM HCL 5 MG/5ML IJ SOLN
INTRAMUSCULAR | Status: DC | PRN
  Administered 2019-07-29: 1 mg via INTRAVENOUS

## 2019-07-29 MED ORDER — 0.9 % SODIUM CHLORIDE (POUR BTL) OPTIME
TOPICAL | Status: DC | PRN
  Administered 2019-07-29: 12:00:00 1000 mL

## 2019-07-29 MED ORDER — ONDANSETRON HCL 4 MG/2ML IJ SOLN: 4.0000 mg | Freq: Once | INTRAMUSCULAR | Status: DC | PRN

## 2019-07-29 MED ORDER — FENTANYL CITRATE (PF) 100 MCG/2ML IJ SOLN
INTRAMUSCULAR | Status: AC
  Administered 2019-07-29: 100 ug via INTRAVENOUS
  Filled 2019-07-29: qty 2

## 2019-07-29 MED ORDER — ONDANSETRON HCL 4 MG/2ML IJ SOLN
INTRAMUSCULAR | Status: DC | PRN
  Administered 2019-07-29: 4 mg via INTRAVENOUS

## 2019-07-29 MED ORDER — LIDOCAINE HCL 1 % IJ SOLN
INTRAMUSCULAR | Status: DC | PRN
  Administered 2019-07-29: 7 mL

## 2019-07-29 MED ORDER — ACETAMINOPHEN 500 MG PO TABS
ORAL_TABLET | ORAL | Status: AC
  Administered 2019-07-29: 500 mg via ORAL
  Filled 2019-07-29: qty 1

## 2019-07-29 MED ORDER — PROPOFOL 500 MG/50ML IV EMUL
INTRAVENOUS | Status: DC | PRN
  Administered 2019-07-29: 50 ug/kg/min via INTRAVENOUS

## 2019-07-29 MED ORDER — SODIUM CHLORIDE 0.9 % IV SOLN
INTRAVENOUS | Status: DC | PRN
  Administered 2019-07-29: 500 mL

## 2019-07-29 MED ORDER — ACETAMINOPHEN 500 MG PO TABS: 500.0000 mg | ORAL_TABLET | Freq: Once | ORAL | Status: AC

## 2019-07-29 MED ORDER — FENTANYL CITRATE (PF) 100 MCG/2ML IJ SOLN: 100.0000 ug | Freq: Once | INTRAMUSCULAR | Status: AC

## 2019-07-29 MED ORDER — ACETAMINOPHEN 500 MG PO TABS
ORAL_TABLET | ORAL | Status: AC
  Filled 2019-07-29: qty 2

## 2019-07-29 MED ORDER — LIDOCAINE HCL (PF) 1 % IJ SOLN
INTRAMUSCULAR | Status: AC
  Filled 2019-07-29: qty 30

## 2019-07-29 MED ORDER — CEFAZOLIN SODIUM-DEXTROSE 2-4 GM/100ML-% IV SOLN
2.0000 g | INTRAVENOUS | Status: AC
  Administered 2019-07-29: 2 g via INTRAVENOUS
  Filled 2019-07-29: qty 100

## 2019-07-29 SURGICAL SUPPLY — 37 items
ADH SKN CLS APL DERMABOND .7 (GAUZE/BANDAGES/DRESSINGS) ×1
AGENT HMST SPONGE THK3/8 (HEMOSTASIS)
ARMBAND PINK RESTRICT EXTREMIT (MISCELLANEOUS) ×6 IMPLANT
CANISTER SUCT 3000ML PPV (MISCELLANEOUS) ×3 IMPLANT
CLIP VESOCCLUDE MED 6/CT (CLIP) ×3 IMPLANT
CLIP VESOCCLUDE SM WIDE 6/CT (CLIP) ×3 IMPLANT
COVER PROBE W GEL 5X96 (DRAPES) ×3 IMPLANT
COVER WAND RF STERILE (DRAPES) ×1 IMPLANT
DECANTER SPIKE VIAL GLASS SM (MISCELLANEOUS) IMPLANT
DERMABOND ADVANCED (GAUZE/BANDAGES/DRESSINGS) ×2
DERMABOND ADVANCED .7 DNX12 (GAUZE/BANDAGES/DRESSINGS) ×1 IMPLANT
ELECT REM PT RETURN 9FT ADLT (ELECTROSURGICAL) ×3
ELECTRODE REM PT RTRN 9FT ADLT (ELECTROSURGICAL) ×1 IMPLANT
GLOVE BIO SURGEON STRL SZ7.5 (GLOVE) ×3 IMPLANT
GLOVE BIOGEL PI IND STRL 6.5 (GLOVE) IMPLANT
GLOVE BIOGEL PI IND STRL 8 (GLOVE) ×1 IMPLANT
GLOVE BIOGEL PI INDICATOR 6.5 (GLOVE) ×4
GLOVE BIOGEL PI INDICATOR 8 (GLOVE) ×2
GOWN STRL REUS W/ TWL LRG LVL3 (GOWN DISPOSABLE) ×2 IMPLANT
GOWN STRL REUS W/ TWL XL LVL3 (GOWN DISPOSABLE) ×2 IMPLANT
GOWN STRL REUS W/TWL LRG LVL3 (GOWN DISPOSABLE) ×9
GOWN STRL REUS W/TWL XL LVL3 (GOWN DISPOSABLE) ×3
HEMOSTAT SPONGE AVITENE ULTRA (HEMOSTASIS) IMPLANT
KIT BASIN OR (CUSTOM PROCEDURE TRAY) ×3 IMPLANT
KIT TURNOVER KIT B (KITS) ×3 IMPLANT
NS IRRIG 1000ML POUR BTL (IV SOLUTION) ×3 IMPLANT
PACK CV ACCESS (CUSTOM PROCEDURE TRAY) ×3 IMPLANT
PAD ARMBOARD 7.5X6 YLW CONV (MISCELLANEOUS) ×3 IMPLANT
SLEEVE SURGEON STRL (DRAPES) ×3 IMPLANT
SUT MNCRL AB 4-0 PS2 18 (SUTURE) ×3 IMPLANT
SUT PROLENE 6 0 BV (SUTURE) ×3 IMPLANT
SUT PROLENE 7 0 BV 1 (SUTURE) IMPLANT
SUT VIC AB 3-0 SH 27 (SUTURE) ×3
SUT VIC AB 3-0 SH 27X BRD (SUTURE) ×1 IMPLANT
TOWEL GREEN STERILE (TOWEL DISPOSABLE) ×3 IMPLANT
UNDERPAD 30X30 (UNDERPADS AND DIAPERS) ×3 IMPLANT
WATER STERILE IRR 1000ML POUR (IV SOLUTION) ×3 IMPLANT

## 2019-07-29 NOTE — Anesthesia Preprocedure Evaluation (Addendum)
Anesthesia Evaluation  Patient identified by MRN, date of birth, ID band Patient awake    Reviewed: Allergy & Precautions, NPO status , Patient's Chart, lab work & pertinent test results  Airway Mallampati: III  TM Distance: >3 FB Neck ROM: Full    Dental  (+) Edentulous Upper, Chipped, Missing, Dental Advisory Given   Pulmonary sleep apnea , Current SmokerPatient did not abstain from smoking.,    Pulmonary exam normal breath sounds clear to auscultation       Cardiovascular hypertension, Pt. on medications and Pt. on home beta blockers Normal cardiovascular exam Rhythm:Regular Rate:Normal  ECG: NSR, rate 74   Neuro/Psych negative neurological ROS  negative psych ROS   GI/Hepatic negative GI ROS, Neg liver ROS,   Endo/Other  diabetes, Insulin Dependent  Renal/GU ESRF and DialysisRenal disease (On HD M, W, F)     Musculoskeletal negative musculoskeletal ROS (+)   Abdominal   Peds  Hematology  (+) anemia , HLD   Anesthesia Other Findings END STAGE RENAL DISEASE  Reproductive/Obstetrics                           Anesthesia Physical Anesthesia Plan  ASA: IV  Anesthesia Plan: Regional   Post-op Pain Management:    Induction: Intravenous  PONV Risk Score and Plan: 0 and Ondansetron, Dexamethasone and Treatment may vary due to age or medical condition  Airway Management Planned: Simple Face Mask  Additional Equipment:   Intra-op Plan:   Post-operative Plan:   Informed Consent: I have reviewed the patients History and Physical, chart, labs and discussed the procedure including the risks, benefits and alternatives for the proposed anesthesia with the patient or authorized representative who has indicated his/her understanding and acceptance.     Dental advisory given  Plan Discussed with: CRNA  Anesthesia Plan Comments:        Anesthesia Quick Evaluation

## 2019-07-29 NOTE — Transfer of Care (Signed)
Immediate Anesthesia Transfer of Care Note  Patient: [name].  Procedure(s) Performed: ARTERIOVENOUS (AV) FISTULA CREATION (Left )  Patient Location: PACU  Anesthesia Type:MAC and Regional  Level of Consciousness: awake, oriented and patient cooperative  Airway & Oxygen Therapy: Patient Spontanous Breathing and Patient connected to face mask oxygen  Post-op Assessment: Report given to RN and Post -op Vital signs reviewed and stable  Post vital signs: Reviewed  Last Vitals:  Vitals Value Taken Time  BP 162/86 07/29/19 1238  Temp    Pulse 69 07/29/19 1239  Resp 23 07/29/19 1239  SpO2 100 % 07/29/19 1239  Vitals shown include unvalidated device data.  Last Pain:  Vitals:   07/29/19 1028  TempSrc:   PainSc: 0-No pain         Complications: No apparent anesthesia complications

## 2019-07-29 NOTE — Anesthesia Procedure Notes (Signed)
Anesthesia Regional Block: Supraclavicular block   Pre-Anesthetic Checklist: ,, timeout performed, Correct Patient, Correct Site, Correct Laterality, Correct Procedure, Correct Position, site marked, Risks and benefits discussed,  Surgical consent,  Pre-op evaluation,  At surgeon's request and post-op pain management  Laterality: Left  Prep: chloraprep       Needles:  Injection technique: Single-shot  Needle Type: Echogenic Stimulator Needle     Needle Length: 9cm  Needle Gauge: 21     Additional Needles:   Procedures:,,,, ultrasound used (permanent image in chart),,,,  Narrative:  Start time: 07/29/2019 10:00 AM End time: 07/29/2019 10:10 AM Injection made incrementally with aspirations every 5 mL.  Performed by: Personally  Anesthesiologist: Murvin Natal, MD  Additional Notes: Functioning IV was confirmed and monitors were applied.  A timeout was performed. Sterile prep, hand hygiene and sterile gloves were used. A 10mm 21ga Arrow echogenic stimulator needle was used. Negative aspiration and negative test dose prior to incremental administration of local anesthetic. The patient tolerated the procedure well.  Ultrasound guidance: relevent anatomy identified, needle position confirmed, local anesthetic spread visualized around nerve(s), vascular puncture avoided.  Image printed for medical record.

## 2019-07-29 NOTE — Op Note (Signed)
OPERATIVE NOTE   PROCEDURE: left brachiocephalic arteriovenous fistula placement  PRE-OPERATIVE DIAGNOSIS: ESRD  POST-OPERATIVE DIAGNOSIS: same as above   SURGEON: Marty Heck, MD  ASSISTANT(S): Risa Grill, PA  ANESTHESIA: MAC  ESTIMATED BLOOD LOSS: Minimal  FINDING(S): 1.  Cephalic vein: 5 mm, acceptable 2.  Brachial artery: 3-4 mm, atherosclerotic disease evident (high brachial artery bifurcation, anastomosis to likely radial artery just below elbow) 3.  Venous outflow: palpable thrill  4.  Radial flow: palpable radial pulse  SPECIMEN(S):  none  INDICATIONS:   [name]. is a 69 y.o. male who presents with ESRD and need for permanent hemodialysis access.  The patient is scheduled for left arteriovenous fistula placement.  The patient is aware the risks include but are not limited to: bleeding, infection, steal syndrome, nerve damage, ischemic monomelic neuropathy, failure to mature, and need for additional procedures.  The patient is aware of the risks of the procedure and elects to proceed forward.   DESCRIPTION: After full informed written consent was obtained from the patient, the patient was brought back to the operating room and placed supine upon the operating table.  Prior to induction, the patient received IV antibiotics.   After obtaining adequate anesthesia, the patient was then prepped and draped in the standard fashion for a left arm access procedure.  I turned my attention first to identifying the patient's cephalic vein and brachial artery.  The patient did have duplicated artery at the antecubital and I suspect high bifurcation.   I selected the more lateral artery that was likely radial given both were the same size.  Using SonoSite guidance, the location of these vessels were marked out on the skin.     At this point, I injected local anesthetic to obtain a field block of the antecubitum.  In total, I injected about 10 mL of 1% lidocaine  without epinephrine.  I made a transverse incision below the level of the antecubitum and dissected through the subcutaneous tissue and fascia to gain exposure of the brachial artery.  This was noted to be 3-4 mm in diameter externally.  This was dissected out proximally and distally and controlled with vessel loops .  I then dissected out the cephalic vein.  This was noted to be 5 mm in diameter externally.  The distal segment of the vein was ligated with a  2-0 silk, and the vein was transected.  The proximal segment was interrogated with serial dilators.  The vein accepted up to a 5 mm dilator without any difficulty.  I then instilled the heparinized saline into the vein and clamped it.  At this point, I reset my exposure of the brachial artery.  The patient was given 3000 units IV heparin.  I placed the artery under tension proximally and distally.  I made an arteriotomy with a #11 blade, and then I extended the arteriotomy with a Potts scissor.  I injected heparinized saline proximal and distal to this arteriotomy.  The vein was then sewn to the artery in an end-to-side configuration with a running stitch of 6-0 Prolene.  Prior to completing this anastomosis, I allowed the vein and artery to backbleed.  There was no evidence of clot from any vessels.  I completed the anastomosis in the usual fashion and then released all vessel loops and clamps.    There was a palpable thrill in the venous outflow, and there was a palpable radial pulse.  At this point, I irrigated out the surgical  wound.  There was no further active bleeding.  The subcutaneous tissue was reapproximated with a running stitch of 3-0 Vicryl.  The skin was then reapproximated with a running subcuticular stitch of 4-0 Monocryl.  The skin was then cleaned, dried, and reinforced with Dermabond.  The patient tolerated this procedure well.   COMPLICATIONS: None  CONDITION: Stable  Marty Heck, MD [facility]: [phone]  07/29/2019, 12:26 PM

## 2019-07-29 NOTE — Anesthesia Procedure Notes (Signed)
Procedure Name: MAC Date/Time: 07/29/2019 11:40 AM Performed by: Jenne Campus, CRNA Pre-anesthesia Checklist: Patient identified, Emergency Drugs available, Suction available and Patient being monitored Oxygen Delivery Method: Simple face mask

## 2019-07-29 NOTE — H&P (Signed)
History and Physical Interval Note:  07/29/2019 10:55 AM  [name].  has presented today for surgery, with the diagnosis of END STAGE RENAL DISEASE.  The various methods of treatment have been discussed with the patient and family. After consideration of risks, benefits and other options for treatment, the patient has consented to  Procedure(s): ARTERIOVENOUS (AV) FISTULA CREATION (Left) as a surgical intervention.  The patient's history has been reviewed, patient examined, no change in status, stable for surgery.  I have reviewed the patient's chart and labs.  Questions were answered to the patient's satisfaction.    Left arm AVF  Marty Heck  Patient name: [name]. MRN: [id number] DOB: [birth year] Sex: male  REASON FOR CONSULT: Evaluate permanent dialysis access  HPI:  [name]. is a 69 y.o. male, with end-stage renal disease, hypertension, hyperlipidemia, diabetes that presents for evaluation of permanent dialysis access. Patient states he is currently dialyzing on Monday Wednesdays and Fridays via catheter in his right IJ. He denies any upper extremity AV fistula access in the past. He is right-handed. Denies any numbness or tingling in his upper extremities. States he is retired no longer working.      Past Medical History:  Diagnosis Date  . Anemia   . Arthritis    left knee  . Chronic kidney disease    acute renal failure/injury requiring short-term HD 2013  . Colon cancer ([place])   . Colon polyp    11/2011 - Polyps identified, biopsy - invasive adenocarinoma  . DM II (diabetes mellitus, type II), controlled ([place])    type 2 IDDM x 15 years. A1C 1/09 13.7.   Marland Kitchen 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 12/25/2011  . GERD (gastroesophageal reflux disease)   . Heart murmur   . 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 Hyperlipidemia   . Hypertension    for a few years and resolved in 2013/2014.  . Sleep apnea    does not wear CPAP now        Past  Surgical History:  Procedure Laterality Date  . APPLICATION OF WOUND VAC  02/16/2012   Procedure: APPLICATION OF WOUND VAC; Surgeon: Zenovia Jarred, MD; Location: [place]; Service: General; Laterality: N/A;  . APPLICATION OF WOUND VAC  02/26/2012   Procedure: APPLICATION OF WOUND VAC; Surgeon: Imogene Burn. [name], MD; Location: [place]; Service: General; Laterality: N/A;  . COLONOSCOPY    . COLONOSCOPY  02/06/2012   Procedure: COLONOSCOPY; Surgeon: Milus Banister, MD; Location: [place]; Service: Endoscopy;;  . COLONOSCOPY WITH PROPOFOL N/A 06/10/2019   Procedure: COLONOSCOPY WITH PROPOFOL; Surgeon: Milus Banister, MD; Location: [facility]; Service: Endoscopy; Laterality: N/A;  . COLOSTOMY REVERSAL    . DRESSING CHANGE UNDER ANESTHESIA  02/18/2012   Procedure: DRESSING CHANGE UNDER ANESTHESIA; Surgeon: Odis Hollingshead, MD; Location: [place]; Service: General; Laterality: N/A;  . ESOPHAGOGASTRODUODENOSCOPY (EGD) WITH PROPOFOL N/A 06/10/2019   Procedure: ESOPHAGOGASTRODUODENOSCOPY (EGD) WITH PROPOFOL; Surgeon: Milus Banister, MD; Location[facility]; Service: Endoscopy; Laterality: N/A;  . GANGLION CYST EXCISION  20 YRS AGO   RT ARM  . ILEOSTOMY  02/16/2012   Procedure: ILEOSTOMY; Surgeon: Zenovia Jarred, MD; Location: [place]; Service: General; Laterality: N/A;  . ILEOSTOMY CLOSURE N/A 09/02/2012   Procedure: ILEOSTOMY REVERSAL; Surgeon: Imogene Burn. [name], MD; Location: [place]; Service: General; Laterality: N/A;  . ILIOSTOMY    . INCISION AND DRAINAGE OF WOUND  02/23/2012   Procedure: IRRIGATION AND DEBRIDEMENT WOUND; Surgeon:  Thomas A. Cornett, MD; Location: [place]; Service: General; Laterality: N/A;  . LAPAROTOMY  02/16/2012   Procedure: EXPLORATORY LAPAROTOMY; Surgeon: Zenovia Jarred, MD; Location: [facility]; Service: General; Laterality: N/A; Exploratory Laparotomy with resection of anastomosis  . LAPAROTOMY  02/18/2012   Procedure: EXPLORATORY LAPAROTOMY; Surgeon: Odis Hollingshead, MD; Location: [place]; Service: General; Laterality: N/A; exploratory laparotomy change of abdominal vac dressing  . LAPAROTOMY  02/20/2012   Procedure: EXPLORATORY LAPAROTOMY; Surgeon: Odis Hollingshead, MD; Location: [place]; Service: General; Laterality: N/A;  . LAPAROTOMY  02/23/2012   Procedure: EXPLORATORY LAPAROTOMY; Surgeon: Joyice Faster. Cornett, MD; Location: [place]; Service: General; Laterality: N/A; Irrigation and Debridement of abdominal wound with wound vac change with partial closure  . LAPAROTOMY  02/26/2012   Procedure: EXPLORATORY LAPAROTOMY; Surgeon: Imogene Burn. [name], MD; Location: [place]; Service: General; Laterality: N/A;   . PARTIAL COLECTOMY  02/06/2012  . PARTIAL COLECTOMY  02/06/2012   Procedure: PARTIAL COLECTOMY; Surgeon: Imogene Burn. [name], MD; Location: [place]; Service: General; Laterality: N/A; right partial colectomy  . RESECTION SMALL BOWEL / CLOSURE ILEOSTOMY  402/2014   Dr [name]  . VACUUM ASSISTED CLOSURE CHANGE  02/20/2012   Procedure: ABDOMINAL VACUUM ASSISTED CLOSURE CHANGE; Surgeon: Odis Hollingshead, MD; Location: [place]; Service: General;;  . VACUUM ASSISTED CLOSURE CHANGE  02/23/2012   Procedure: ABDOMINAL VACUUM ASSISTED CLOSURE CHANGE; Surgeon: Joyice Faster. Cornett, MD; Location: [facility]; Service: General; Laterality: N/A;        Family History  Problem Relation Age of Onset  . Diabetes Father   . Heart disease Father   . Hypertension Mother   . Diabetes Sister   . Heart disease Sister   . Colon cancer Neg Hx    SOCIAL HISTORY:  Social History        Socioeconomic History  . Marital status: Divorced    Spouse name: Not on file  . Number of children: 3  . Years of education: Not on file  . Highest education level: Not on file  Occupational History  . Occupation: retired/disabled    Employer: [employer]    Comment: Worked at [employer]  . Smoking status: Current Every Day Smoker    Packs/day: 0.50    Years: 30.00    Pack years: 15.00    Types: Cigarettes  .  Smokeless tobacco: Never Used  Substance and Sexual Activity  . Alcohol use: Yes    Comment: Liquor twice monthly.  . Drug use: No  . Sexual activity: Not on file  Other Topics Concern  . Not on file  Social History Narrative   Financial assistance approved for 100% discount at [facility] and has [facility] card   [employer] August 08, 2009 5:48 PM   *PATIENT WAS GIVEN DM CARD.Lela Sturdivant NT II June 08, 2010 3:56 PM      Divorced, one daughter currently staying with him   Has 7 grand children   Reports disability and medicaid is pending   Social Determinants of Health      Financial Resource Strain:   . Difficulty of Paying Living Expenses: Not on file  Food Insecurity:   . Worried About Charity fundraiser in the Last Year: Not on file  . Ran Out of Food in the Last Year: Not on file  Transportation Needs:   . Lack of Transportation (Medical): Not on file  . Lack of Transportation (Non-Medical): Not on file  Physical Activity:   .  Days of Exercise per Week: Not on file  . Minutes of Exercise per Session: Not on file  Stress:   . Feeling of Stress : Not on file  Social Connections:   . Frequency of Communication with Friends and Family: Not on file  . Frequency of Social Gatherings with Friends and Family: Not on file  . Attends Religious Services: Not on file  . Active Member of Clubs or Organizations: Not on file  . Attends Archivist Meetings: Not on file  . Marital Status: Not on file  Intimate Partner Violence:   . Fear of Current or Ex-Partner: Not on file  . Emotionally Abused: Not on file  . Physically Abused: Not on file  . Sexually Abused: Not on file   No Known Allergies        Current Outpatient Medications  Medication Sig Dispense Refill  . amLODipine (NORVASC) 10 MG tablet Take 10 mg by mouth daily.    Marland Kitchen atorvastatin (LIPITOR) 20 MG tablet Take 20 mg by mouth daily.    . ferrous sulfate 325 (65 FE) MG tablet Take 325 mg by mouth daily with  breakfast.    . insulin detemir (LEVEMIR) 100 UNIT/ML injection Inject 0.24 mLs (24 Units total) into the skin at bedtime. (Patient taking differently: Inject 26 Units into the skin daily. ) 10 mL 11  . metoprolol tartrate (LOPRESSOR) 50 MG tablet Take 50 mg by mouth daily.     . Omega-3 Fatty Acids (FISH OIL) 1000 MG CPDR Take 1,000 mg by mouth daily.     Marland Kitchen omeprazole (PRILOSEC) 20 MG capsule Take 20 mg by mouth daily.    . vitamin B-12 (CYANOCOBALAMIN) 1000 MCG tablet Take 1,000 mcg by mouth daily.     No current facility-administered medications for this visit.   REVIEW OF SYSTEMS:  [X]  denotes positive finding, [ ]  denotes negative finding  Cardiac  Comments:  Chest pain or chest pressure:    Shortness of breath upon exertion:    Short of breath when lying flat:    Irregular heart rhythm:        Vascular    Pain in calf, thigh, or hip brought on by ambulation:    Pain in feet at night that wakes you up from your sleep:     Blood clot in your veins:    Leg swelling:         Pulmonary    Oxygen at home:    Productive cough:     Wheezing:         Neurologic    Sudden weakness in arms or legs:     Sudden numbness in arms or legs:     Sudden onset of difficulty speaking or slurred speech:    Temporary loss of vision in one eye:     Problems with dizziness:         Gastrointestinal    Blood in stool:     Vomited blood:         Genitourinary    Burning when urinating:     Blood in urine:        Psychiatric    Major depression:         Hematologic    Bleeding problems:    Problems with blood clotting too easily:        Skin    Rashes or ulcers:        Constitutional    Fever or chills:  PHYSICAL EXAM:     Vitals:   07/20/19 1050  BP: (!) 152/82  Pulse: 70  Resp: 18  Temp: 97.9 F (36.6 C)  TempSrc: Temporal  SpO2: 98%  Weight: 227 lb (103 kg)  Height: 5' 11.5" (1.816 m)   GENERAL: The patient is a well-nourished male, in no acute distress. The vital  signs are documented above.  CARDIAC: There is a regular rate and rhythm.  VASCULAR:  2+ palpable radial brachial pulse bilateral upper extremity.  No upper extremity tissue loss.  PULMONARY: There is good air exchange bilaterally without wheezing or rales.  ABDOMEN: Soft and non-tender with normal pitched bowel sounds.  MUSCULOSKELETAL: There are no major deformities or cyanosis.  NEUROLOGIC: No focal weakness or paresthesias are detected.  SKIN: There are no ulcers or rashes noted.  PSYCHIATRIC: The patient has a normal affect.  DATA:  Independently reviewed arterial duplex and triphasic waveforms in both upper extremities. 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  Independently reviewed bilateral upper extremity vein mapping and in his nondominant arm he has a very nice cephalic and basilic vein that appears usable at the elbow.  Assessment/Plan:  69 year old male with end-stage renal disease presents for permanent dialysis access evaluation. Discussed plan for placement of fistula in the nondominant arm which would be his left arm. He has a nice palpable radial brachial pulse in the left arm and a very nice cephalic vein at the level of the elbow would likely be a candidate for a brachiocephalic. Noted duplicate artery on duplex. Discussed risk benefits including risk of bleeding, infection, steal, and failure to mature. Ultimately offered to schedule his surgery today but he wants to discuss with his daughter will call back to arrange whenever he can have support to bring him to the hospital and take him home.  Marty Heck, MD  [facility]  Office: [phone]

## 2019-07-29 NOTE — Discharge Instructions (Signed)
° °  [facility] ° °Discharge Instructions ° °AV Fistula or Graft Surgery for Dialysis Access ° °Please refer to the following instructions for your post-procedure care. Your surgeon or physician assistant will discuss any changes with you. ° °Activity ° °You may drive the day following your surgery, if you are comfortable and no longer taking prescription pain medication. Resume full activity as the soreness in your incision resolves. ° °Bathing/Showering ° °You may shower after you go home. Keep your incision dry for 48 hours. Do not soak in a bathtub, hot tub, or swim until the incision heals completely. You may not shower if you have a hemodialysis catheter. ° °Incision Care ° °Clean your incision with mild soap and water after 48 hours. Pat the area dry with a clean towel. You do not need a bandage unless otherwise instructed. Do not apply any ointments or creams to your incision. You may have skin glue on your incision. Do not peel it off. It will come off on its own in about one week. Your arm may swell a bit after surgery. To reduce swelling use pillows to elevate your arm so it is above your heart. Your doctor will tell you if you need to lightly wrap your arm with an ACE bandage. ° °Diet ° °Resume your normal diet. There are not special food restrictions following this procedure. In order to heal from your surgery, it is CRITICAL to get adequate nutrition. Your body requires vitamins, minerals, and protein. Vegetables are the best source of vitamins and minerals. Vegetables also provide the perfect balance of protein. Processed food has little nutritional value, so try to avoid this. ° °Medications ° °Resume taking all of your medications. If your incision is causing pain, you may take over-the counter pain relievers such as acetaminophen (Tylenol). If you were prescribed a stronger pain medication, please be aware these medications can cause nausea and constipation. Prevent  nausea by taking the medication with a snack or meal. Avoid constipation by drinking plenty of fluids and eating foods with high amount of fiber, such as fruits, vegetables, and grains. Do not take Tylenol if you are taking prescription pain medications. ° ° ° ° °Follow up °Your surgeon may want to see you in the office following your access surgery. If so, this will be arranged at the time of your surgery. ° °Please call us immediately for any of the following conditions: ° °Increased pain, redness, drainage (pus) from your incision site °Fever of 101 degrees or higher °Severe or worsening pain at your incision site °Hand pain or numbness. ° °Reduce your risk of vascular disease: ° °Stop smoking. If you would like help, call [facility] at 1-800-QUIT-NOW (1-800-784-8669) or [place] at 336-586-4000 ° °Manage your cholesterol °Maintain a desired weight °Control your diabetes °Keep your blood pressure down ° °Dialysis ° °It will take several weeks to several months for your new dialysis access to be ready for use. Your surgeon will determine when it is OK to use it. Your nephrologist will continue to direct your dialysis. You can continue to use your Permcath until your new access is ready for use. ° °If you have any questions, please call the office at [phone]. ° °

## 2019-07-29 NOTE — Progress Notes (Signed)
Orthopedic Tech Progress Note Patient Details:  [name]. 02-Jan-1951 [id number]  Ortho Devices Type of Ortho Device: Arm sling Ortho Device/Splint Location: left Ortho Device/Splint Interventions: Application   Post Interventions Patient Tolerated: Well Instructions Provided: Care of device   [place] Pink 07/29/2019, 1:05 PM

## 2019-07-29 NOTE — Anesthesia Postprocedure Evaluation (Signed)
Anesthesia Post Note  Patient: [name].  Procedure(s) Performed: ARTERIOVENOUS (AV) FISTULA CREATION (Left )     Patient location during evaluation: PACU Anesthesia Type: Regional Level of consciousness: awake and alert Pain management: pain level controlled Vital Signs Assessment: post-procedure vital signs reviewed and stable Respiratory status: spontaneous breathing, nonlabored ventilation, respiratory function stable and patient connected to nasal cannula oxygen Cardiovascular status: stable and blood pressure returned to baseline Postop Assessment: no apparent nausea or vomiting Anesthetic complications: no    Last Vitals:  Vitals:   07/29/19 1245 07/29/19 1300  BP:  (!) 155/85  Pulse: 64 65  Resp: 19 17  Temp:  36.5 C  SpO2: 100% 100%    Last Pain:  Vitals:   07/29/19 1300  TempSrc:   PainSc: 0-No pain                 Fatiha Guzy P Puneet Selden

## 2019-09-06 ENCOUNTER — Other Ambulatory Visit: Payer: Self-pay | Admitting: *Deleted

## 2019-09-06 DIAGNOSIS — N186 End stage renal disease: Secondary | ICD-10-CM

## 2019-09-07 ENCOUNTER — Ambulatory Visit (HOSPITAL_COMMUNITY)
Admission: RE | Admit: 2019-09-07 | Discharge: 2019-09-07 | Disposition: A | Discharge status: Home / Self Care | Payer: Medicare Other | Source: Ambulatory Visit | Attending: Vascular Surgery | Admitting: Vascular Surgery

## 2019-09-07 ENCOUNTER — Other Ambulatory Visit: Payer: Self-pay

## 2019-09-07 ENCOUNTER — Ambulatory Visit (INDEPENDENT_AMBULATORY_CARE_PROVIDER_SITE_OTHER): Payer: Self-pay | Admitting: Physician Assistant

## 2019-09-07 VITALS — BP 153/73 | HR 94 | Temp 97.6°F | Resp 18 | Ht 69.0 in | Wt 223.6 lb

## 2019-09-07 DIAGNOSIS — N186 End stage renal disease: Secondary | ICD-10-CM

## 2019-09-07 NOTE — Progress Notes (Signed)
POST OPERATIVE OFFICE NOTE    CC:  F/u for surgery  HPI:  This is a 69 y.o. male who is s/p left brachiocephalic AV fistula by Dr. Carlis Abbott on 07/29/19. His left upper extremity is well healed. He denies any steal symptoms. He does not have any pain in the left upper extremity, no coldness, numbness, or ischemic changes of fingers. He currently dialyzes via a right IJ TDC MWF at [facility] on [address].  No Known Allergies  Current Outpatient Medications  Medication Sig Dispense Refill  . amLODipine (NORVASC) 10 MG tablet Take 10 mg by mouth at bedtime.     Marland Kitchen atorvastatin (LIPITOR) 20 MG tablet Take 20 mg by mouth at bedtime.     . betamethasone dipropionate 0.05 % cream Apply 1 application topically 2 (two) times daily as needed (skin irritation; psoriasis).    . clotrimazole (ANTI-FUNGAL) 1 % cream Apply 1 application topically 2 (two) times daily as needed (skin irritation; psoriasis).    Marland Kitchen Cod Liver Oil CAPS Take 1 capsule by mouth 2 (two) times a week.    . ferrous sulfate 325 (65 FE) MG tablet Take 325 mg by mouth 2 (two) times a week.     Marland Kitchen HYDROcodone-acetaminophen (NORCO/VICODIN) 5-325 MG tablet Take 1 tablet by mouth every 4 (four) hours as needed for moderate pain. 10 tablet 0  . insulin detemir (LEVEMIR) 100 UNIT/ML injection Inject 0.24 mLs (24 Units total) into the skin at bedtime. (Patient taking differently: Inject 26 Units into the skin at bedtime. ) 10 mL 11  . metoprolol tartrate (LOPRESSOR) 50 MG tablet Take 50 mg by mouth at bedtime.     . Multiple Vitamin (MULTIVITAMIN WITH MINERALS) TABS tablet Take 1 tablet by mouth 2 (two) times a week.    . vitamin B-12 (CYANOCOBALAMIN) 1000 MCG tablet Take 1,000 mcg by mouth 2 (two) times a week.      No current facility-administered medications for this visit.     ROS:  See HPI  Physical Exam:  Vitals:   09/07/19 1001  BP: (!) 153/73  Pulse: 94  Resp: 18  Temp: 97.6 F (36.4 C)  SpO2: 100%  Weight: 223 lb 9.6 oz  (101.4 kg)  Height: 5\' 9"  (1.753 m)    Incision: left AC healed, no tenderness, no swelling Extremities:  2+ radial pulses bilaterally, 2+ left brachial pulse, left hand warm, 5/5 grip strength Neuro: Alert and oriented   Non-invasive vascular lab +------------+----------+-------------+----------+----------------+  OUTFLOW VEINPSV (cm/s)Diameter (cm)Depth (cm)  Describe    +------------+----------+-------------+----------+----------------+  Shoulder    154    0.80     1.03            +------------+----------+-------------+----------+----------------+  Prox UA     163    0.64     0.83  competing branch  +------------+----------+-------------+----------+----------------+  Mid UA     189    0.67     0.29  competing branch  +------------+----------+-------------+----------+----------------+  Dist UA     259    0.77     0.39            +------------+----------+-------------+----------+----------------+  AC Fossa    305    0.98     0.21            +------------+----------+-------------+----------+----------------+   Summary:  Patent arteriovenous fistula with two small branches observed.    Assessment/Plan:  This is a 68 y.o. male who is s/p left brachiocephalic AV fistula 11/29/45 by Dr. Carlis Abbott. His left  arm is well healed. He is without steal symptoms of his left upper extremity. His duplex today is showing adequate diameter of the fistula however slightly deep in the proximal upper arm. However clinically I think there is adequate room to access the fistula in the left upper arm.   - The dialysis center can access the fistula as of 10/26/19. -The pt has tunneled dialysis catheter and once access has been used successfully to the satisfaction of the dialysis center, the tunneled catheter can be removed at their discretion - he will follow up as need if he has any issues with  the left AV fistula   Karoline Caldwell, PA-C [facility] [phone]  Clinic MD: Dr. Carlis Abbott

## 2019-12-30 ENCOUNTER — Emergency Department (HOSPITAL_COMMUNITY): Payer: No Typology Code available for payment source

## 2019-12-30 ENCOUNTER — Other Ambulatory Visit: Payer: Self-pay

## 2019-12-30 ENCOUNTER — Encounter (HOSPITAL_COMMUNITY): Payer: Self-pay | Admitting: Emergency Medicine

## 2019-12-30 ENCOUNTER — Emergency Department (HOSPITAL_COMMUNITY)
Admission: EM | Admit: 2019-12-30 | Discharge: 2019-12-31 | Disposition: A | Discharge status: Home / Self Care | Payer: No Typology Code available for payment source | Attending: Emergency Medicine | Admitting: Emergency Medicine

## 2019-12-30 DIAGNOSIS — R519 Headache, unspecified: Secondary | ICD-10-CM | POA: Diagnosis present

## 2019-12-30 DIAGNOSIS — R251 Tremor, unspecified: Secondary | ICD-10-CM | POA: Insufficient documentation

## 2019-12-30 DIAGNOSIS — Z794 Long term (current) use of insulin: Secondary | ICD-10-CM | POA: Diagnosis not present

## 2019-12-30 DIAGNOSIS — N186 End stage renal disease: Secondary | ICD-10-CM | POA: Insufficient documentation

## 2019-12-30 DIAGNOSIS — Z79899 Other long term (current) drug therapy: Secondary | ICD-10-CM | POA: Diagnosis not present

## 2019-12-30 DIAGNOSIS — I120 Hypertensive chronic kidney disease with stage 5 chronic kidney disease or end stage renal disease: Secondary | ICD-10-CM | POA: Insufficient documentation

## 2019-12-30 DIAGNOSIS — E1122 Type 2 diabetes mellitus with diabetic chronic kidney disease: Secondary | ICD-10-CM | POA: Insufficient documentation

## 2019-12-30 DIAGNOSIS — H9201 Otalgia, right ear: Secondary | ICD-10-CM | POA: Insufficient documentation

## 2019-12-30 DIAGNOSIS — F17210 Nicotine dependence, cigarettes, uncomplicated: Secondary | ICD-10-CM | POA: Diagnosis not present

## 2019-12-30 LAB — CBC
HCT: 25.6 % — ABNORMAL LOW (ref 39.0–52.0)
Hemoglobin: 8.6 g/dL — ABNORMAL LOW (ref 13.0–17.0)
MCH: 25.8 pg — ABNORMAL LOW (ref 26.0–34.0)
MCHC: 33.6 g/dL (ref 30.0–36.0)
MCV: 76.9 fL — ABNORMAL LOW (ref 80.0–100.0)
Platelets: 180 10*3/uL (ref 150–400)
RBC: 3.33 MIL/uL — ABNORMAL LOW (ref 4.22–5.81)
RDW: 17 % — ABNORMAL HIGH (ref 11.5–15.5)
WBC: 15 10*3/uL — ABNORMAL HIGH (ref 4.0–10.5)
nRBC: 0 % (ref 0.0–0.2)

## 2019-12-30 LAB — BASIC METABOLIC PANEL
Anion gap: 13 (ref 5–15)
BUN: 71 mg/dL — ABNORMAL HIGH (ref 8–23)
CO2: 23 mmol/L (ref 22–32)
Calcium: 8.3 mg/dL — ABNORMAL LOW (ref 8.9–10.3)
Chloride: 96 mmol/L — ABNORMAL LOW (ref 98–111)
Creatinine, Ser: 14.32 mg/dL — ABNORMAL HIGH (ref 0.61–1.24)
GFR calc Af Amer: 4 mL/min — ABNORMAL LOW (ref >60)
GFR calc non Af Amer: 3 mL/min — ABNORMAL LOW (ref >60)
Glucose, Bld: 150 mg/dL — ABNORMAL HIGH (ref 70–99)
Potassium: 3.3 mmol/L — ABNORMAL LOW (ref 3.5–5.1)
Sodium: 132 mmol/L — ABNORMAL LOW (ref 135–145)

## 2019-12-30 LAB — TROPONIN I (HIGH SENSITIVITY): Troponin I (High Sensitivity): 49 ng/L — ABNORMAL HIGH (ref <18)

## 2019-12-30 LAB — PROTIME-INR
INR: 1.3 — ABNORMAL HIGH (ref 0.8–1.2)
Prothrombin Time: 16.1 seconds — ABNORMAL HIGH (ref 11.4–15.2)

## 2019-12-30 IMAGING — DX DG CHEST 2V
2 series · 2 of 2 positions shown · non-contrast
Comparison: [DATE]

CLINICAL DATA: Chest pain.

EXAM:
CHEST - 2 VIEW

[chest lat]
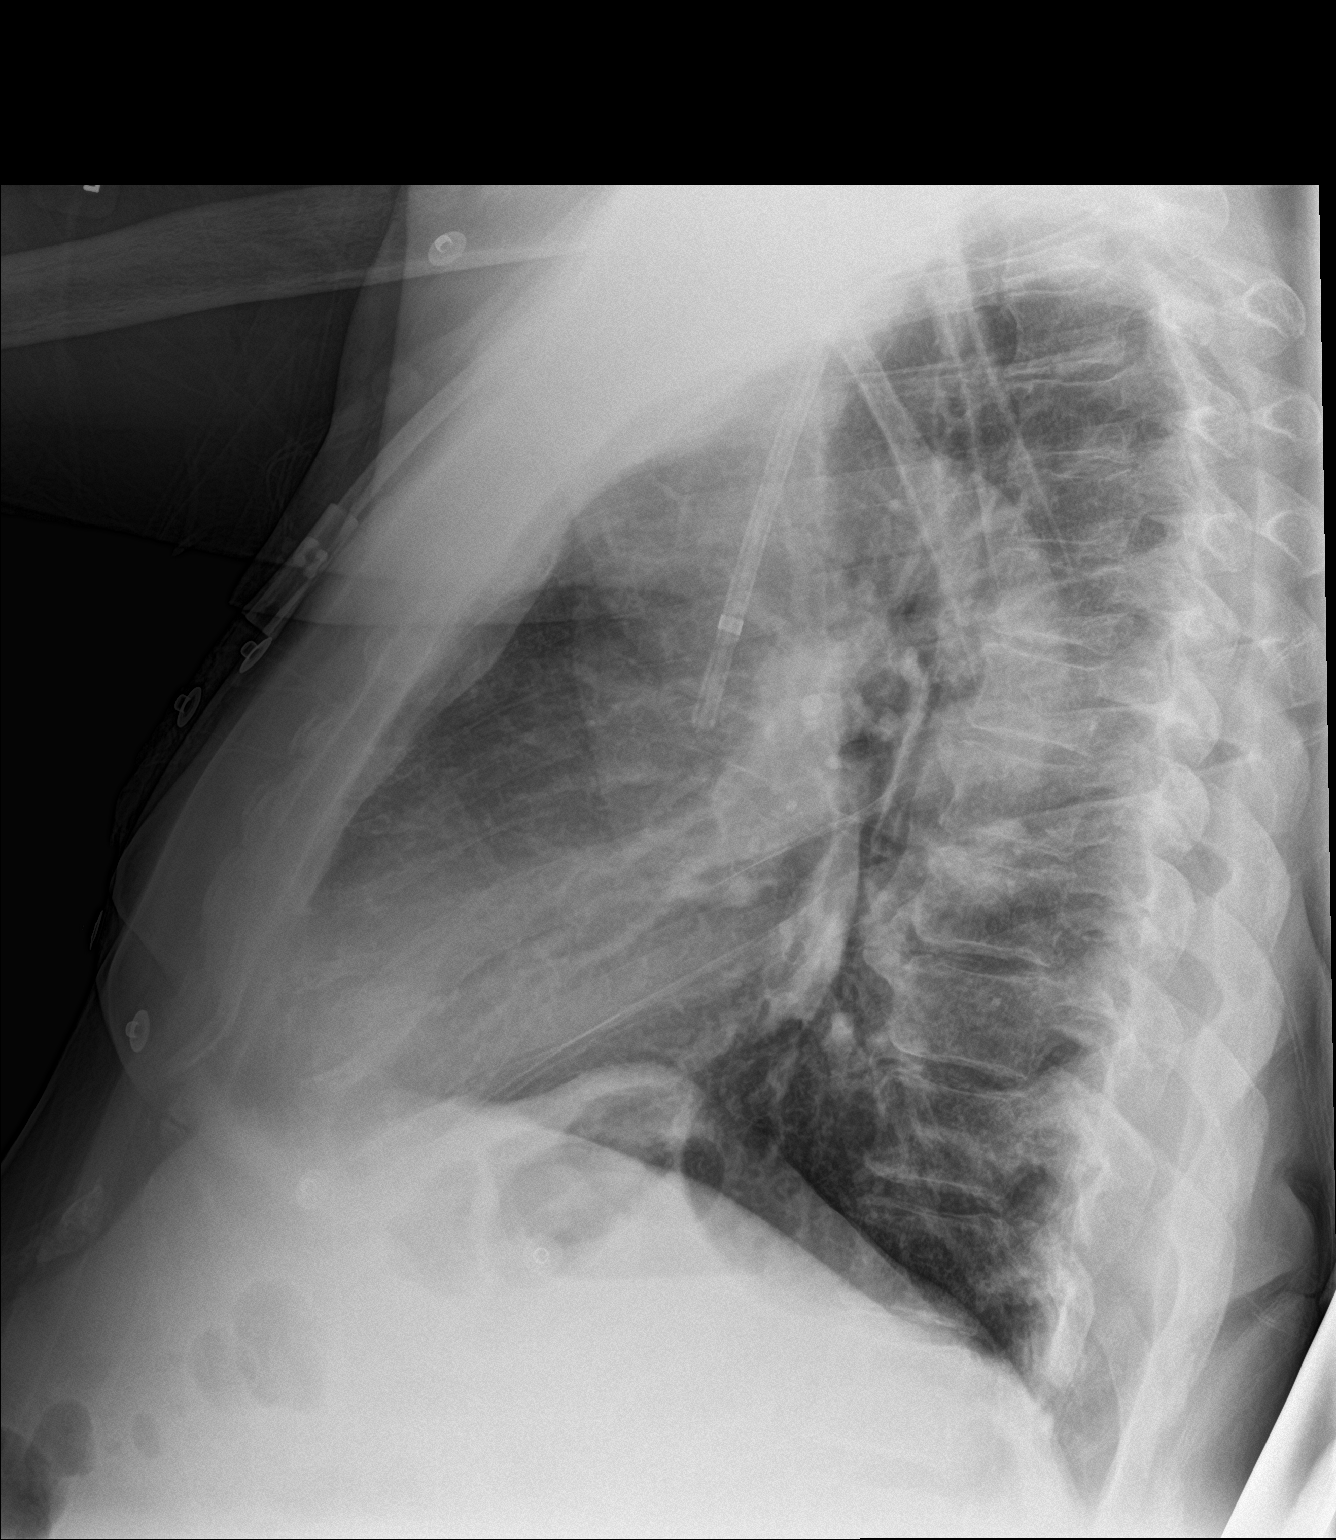

[chest ap]
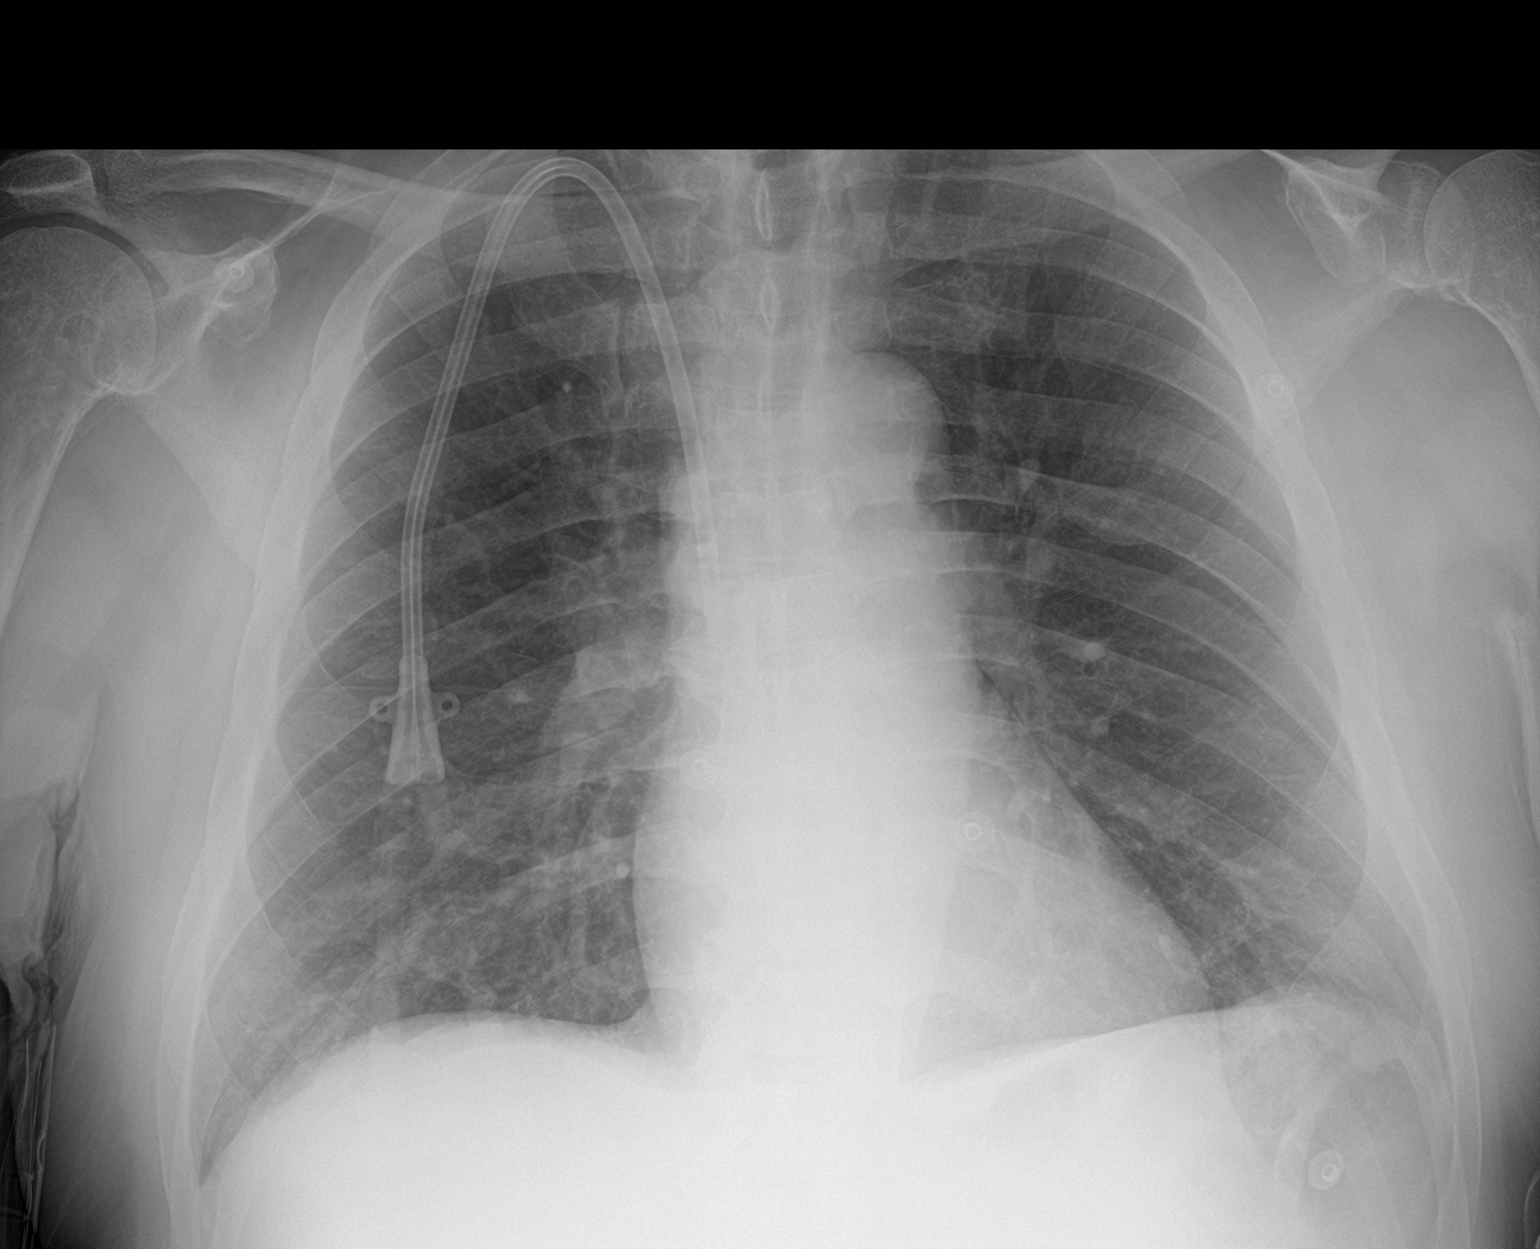

[2 of 2 positions shown; findings below may reference images not displayed]

FINDINGS: Right-sided dialysis catheter tip in the lower SVC. Upper normal
heart size with improved cardiomegaly from prior exam. Minor basilar
atelectasis. No confluent airspace disease. No pulmonary edema. No
pleural effusion or pneumothorax. No acute osseous abnormalities are
seen.
IMPRESSION: Minor basilar atelectasis.

## 2019-12-30 MED ORDER — SODIUM CHLORIDE 0.9% FLUSH
3.0000 mL | Freq: Once | INTRAVENOUS | Status: AC
  Administered 2019-12-31: 3 mL via INTRAVENOUS

## 2019-12-30 NOTE — ED Triage Notes (Signed)
Patient arrived with EMS from home reports left chest pain this evening radiating to left jaw and left arm , denies SOB , no emesis or diaphoresis , patient added left headache today , received ASA 324 mg and 2 NTG sl .

## 2019-12-31 ENCOUNTER — Emergency Department (HOSPITAL_COMMUNITY): Payer: No Typology Code available for payment source

## 2019-12-31 LAB — TROPONIN I (HIGH SENSITIVITY): Troponin I (High Sensitivity): 39 ng/L — ABNORMAL HIGH (ref <18)

## 2019-12-31 IMAGING — MR MR HEAD W/O CM
11 of 14 series · 18 of 48 positions shown · non-contrast
Comparison: Head CT same day

CLINICAL DATA: Headache.  Difficulty walking.  Regional pain.

EXAM:
MRI HEAD WITHOUT CONTRAST
TECHNIQUE: Multiplanar, multiecho pulse sequences of the brain and surrounding
structures were obtained without intravenous contrast.

[Series 2: DWI · axial · 3.0mm · 0.94mm/px · z∈[-140,+12]mm · 4 of 104 slices shown (1 of 2)]
[im 1/104]
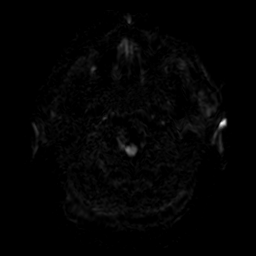
[im 35/104]
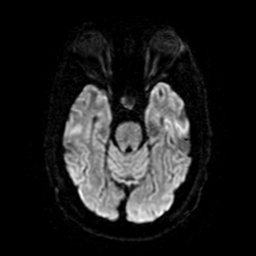
[im 69/104]
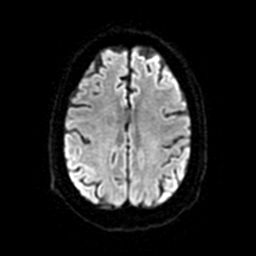
[im 104/104]
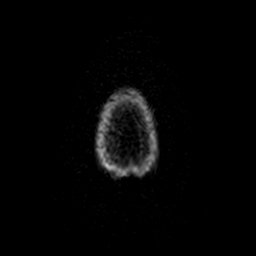

[Series 3: DWI · coronal · 4.0mm · 0.94mm/px · 3 of 78 slices shown (2 of 2)]
[im 1/78]
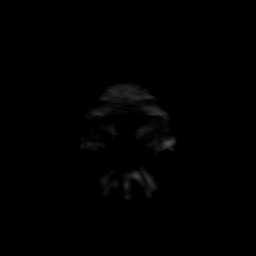
[im 39/78]
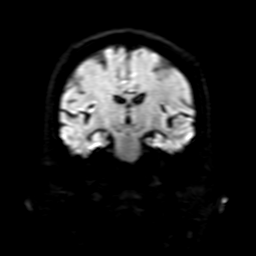
[im 78/78]
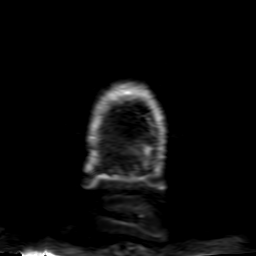

[Series 4: FLAIR · sagittal · 5.0mm · 0.23mm/px · 1 of 23 slices shown (1 of 3)]
[im 1/23]
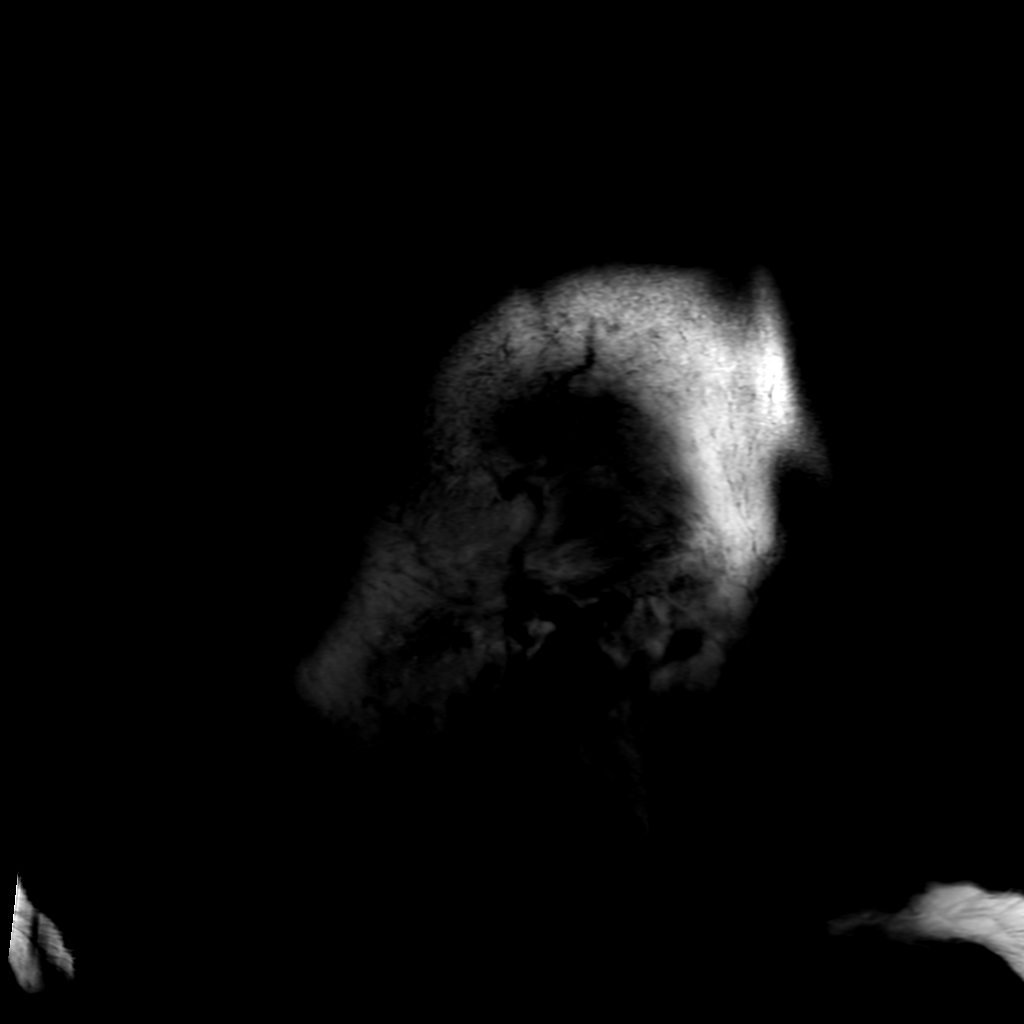

[Series 5: T2 · axial · 5.0mm · 0.23mm/px · 1 of 26 slices shown (1 of 3)]
[im 1/26]
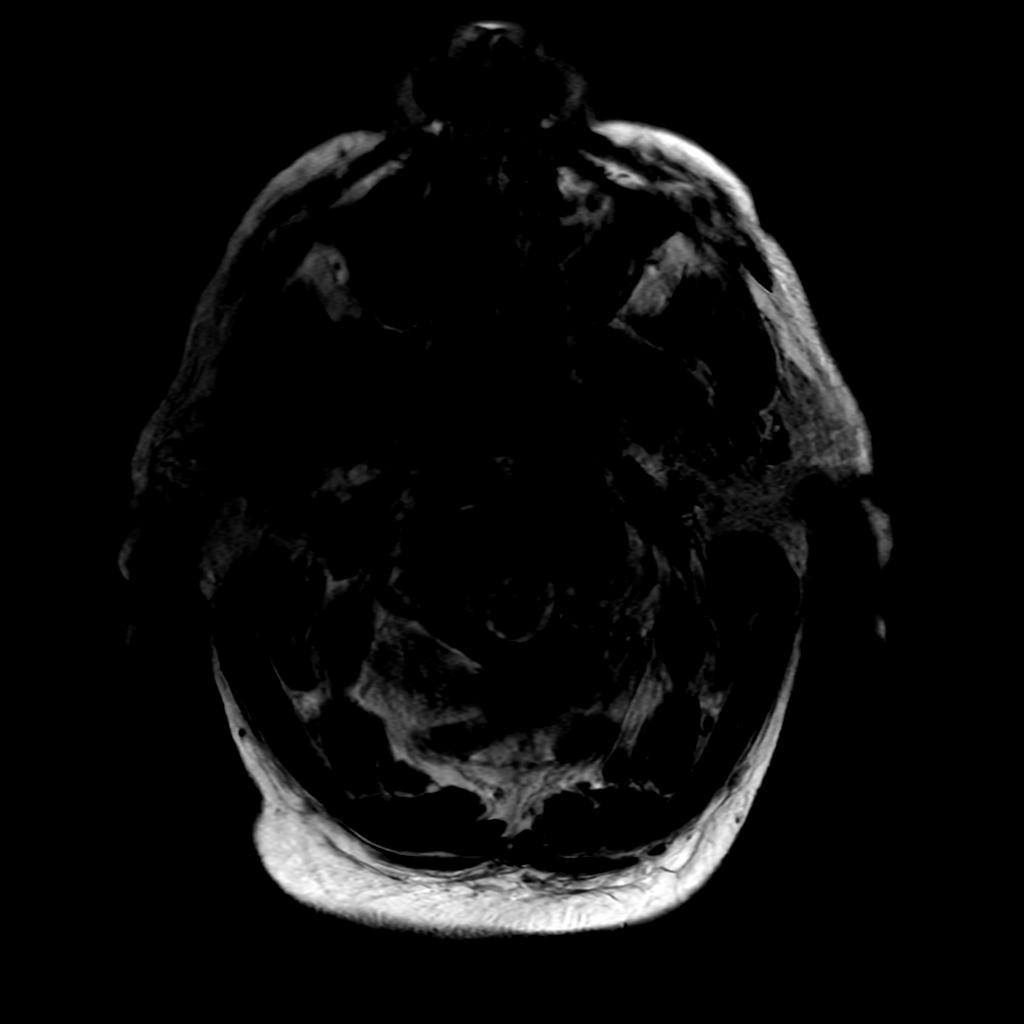

[Series 6: FLAIR · axial · 5.0mm · 0.47mm/px · 1 of 26 slices shown (2 of 3)]
[im 1/26]
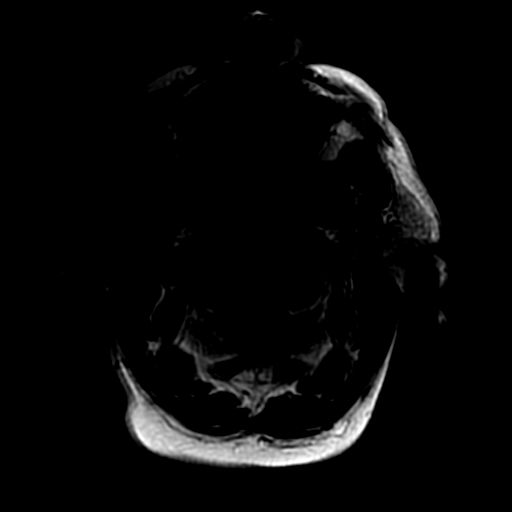

[Series 8: FLAIR · sagittal · 5.0mm · 0.23mm/px · 1 of 23 slices shown (3 of 3)]
[im 1/23]
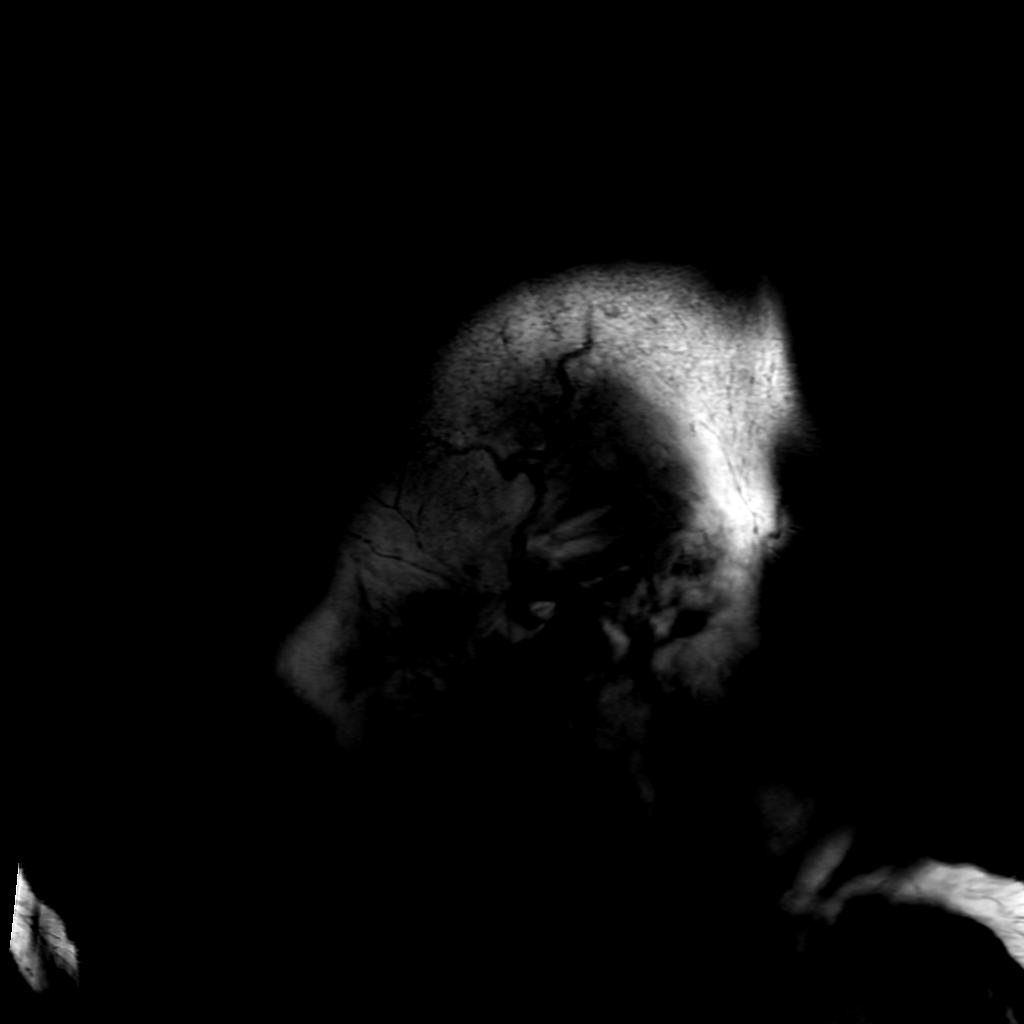

[Series 9: T2 · axial · 5.0mm · 0.47mm/px · 1 of 27 slices shown (2 of 3)]
[im 1/27]
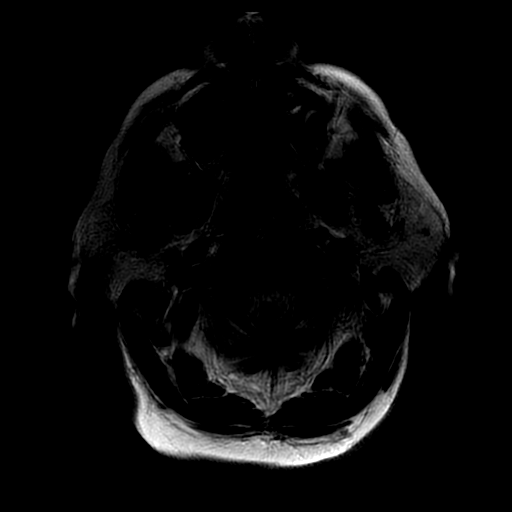

[Series 11: T2 · coronal · 5.0mm · 0.47mm/px · 1 of 31 slices shown (3 of 3)]
[im 1/31]
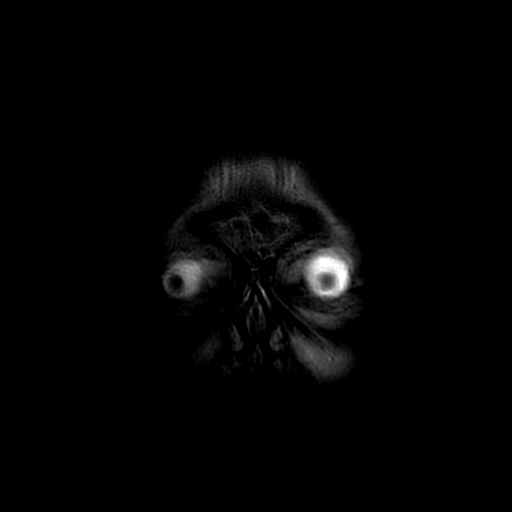

[Series 12: (person_name) · axial · 3.0mm · 0.47mm/px · z∈[-143,-93]mm · 2 of 104 slices shown]
[im 1/104]
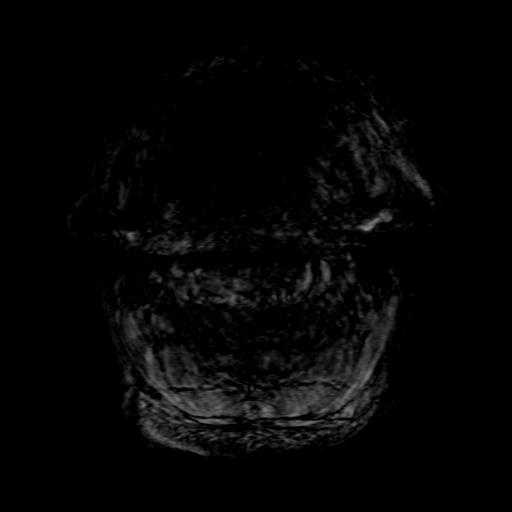
[im 35/104]
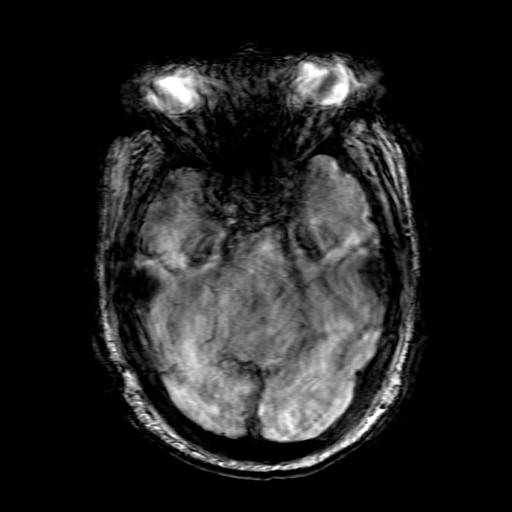

[Series 250: ADC · axial · 3.0mm · 0.94mm/px · z∈[-140,+12]mm · 2 of 52 slices shown (1 of 2)]
[im 1/52]
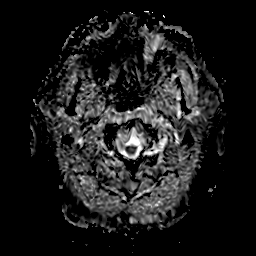
[im 52/52]
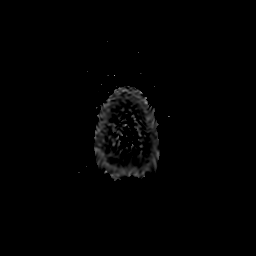

[Series 350: ADC · coronal · 4.0mm · 0.94mm/px · 1 of 39 slices shown (2 of 2)]
[im 1/39]
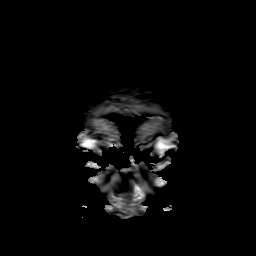

[18 of 48 positions shown; findings below may reference images not displayed]

FINDINGS: Brain: Diffusion imaging does not show any acute or subacute
infarction. Old small vessel infarction in the right para median
pons. No focal cerebellar finding. Cerebral hemispheres show an old
lacunar infarction in the medial thalamus bilaterally. There are
moderate chronic small-vessel ischemic changes of the cerebral
hemispheric white matter. No cortical or large vessel territory
infarction. No mass lesion, hemorrhage, hydrocephalus or extra-axial
collection. No lesion seen of the perimesencephalic cisterns.

Vascular: Major vessels at the base of the brain show flow.

Skull and upper cervical spine: Negative

Sinuses/Orbits: Clear/normal

Other: None
IMPRESSION: No acute, subacute or reversible finding. Old small vessel
infarctions of the pons, thalami and cerebral hemispheric white
matter.

## 2019-12-31 IMAGING — CT CT HEAD W/O CM
4 series · 16 of 47 positions shown, 18 images · non-contrast
Comparison: None.

CLINICAL DATA: Headache.

EXAM:
CT HEAD WITHOUT CONTRAST
TECHNIQUE: Contiguous axial images were obtained from the base of the skull
through the vertex without intravenous contrast.

[Series 3: head without · axial · non-contrast · 0.45mm/px · z∈[-68,+57]mm · 7 of 35 slices shown, 9 images]
[im 5/35  brain]
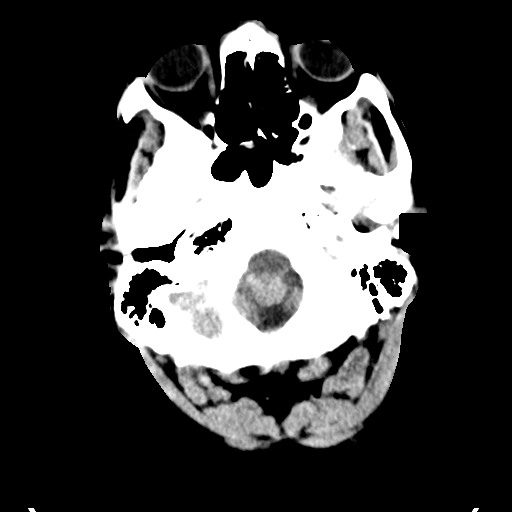
[im 5/35  bone]
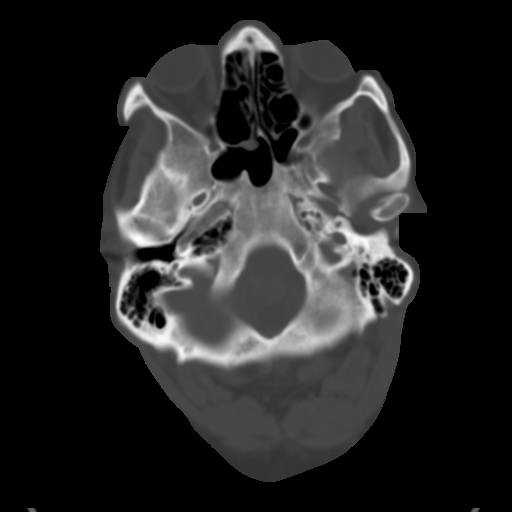
[im 9/35  brain]
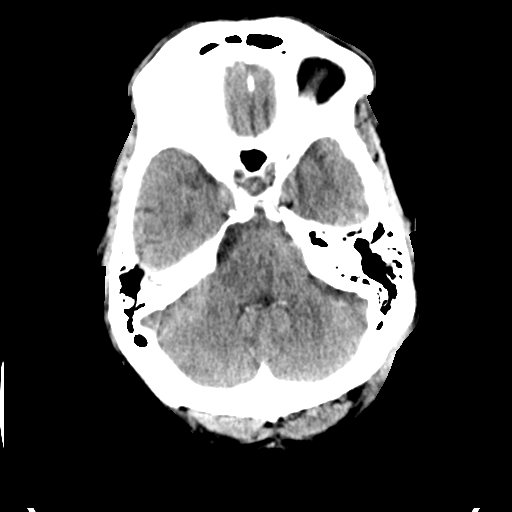
[im 13/35  brain]
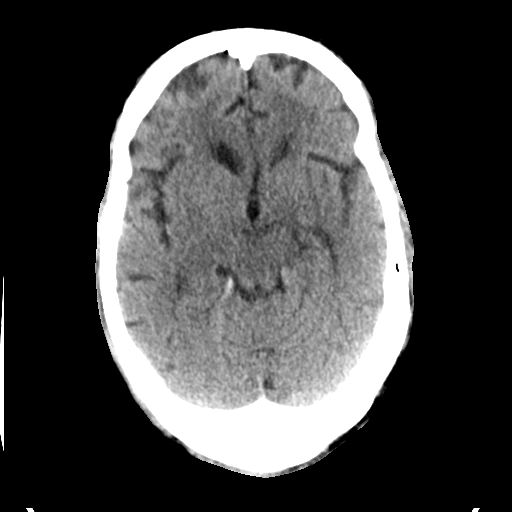
[im 18/35  brain]
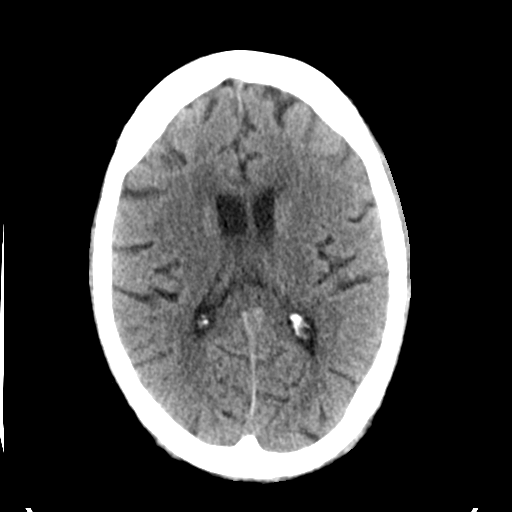
[im 22/35  brain]
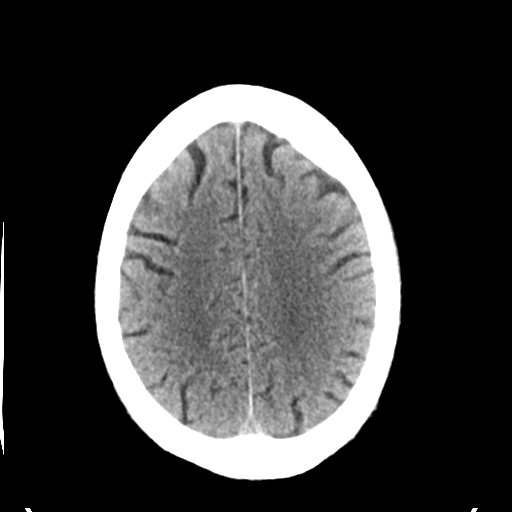
[im 22/35  bone]
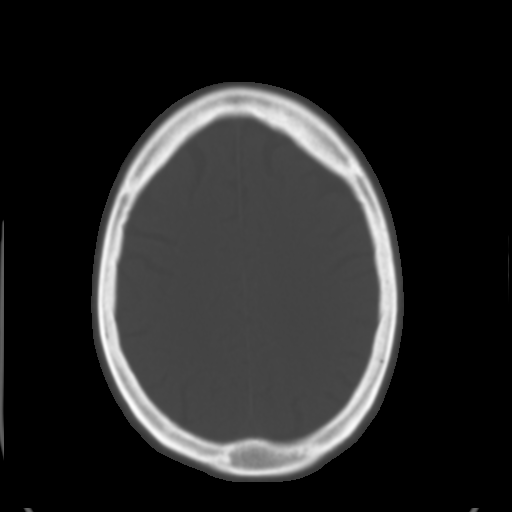
[im 26/35  brain]
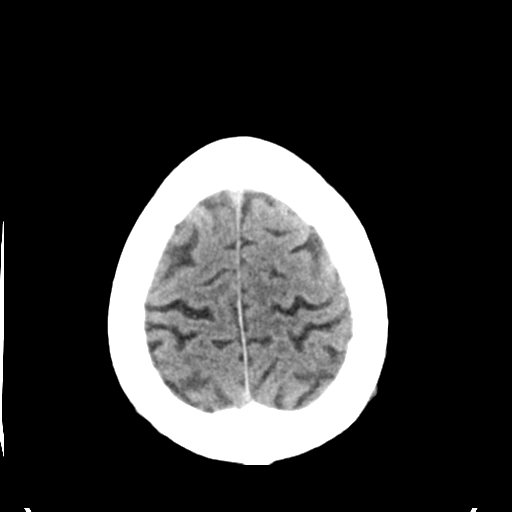
[im 30/35  brain]
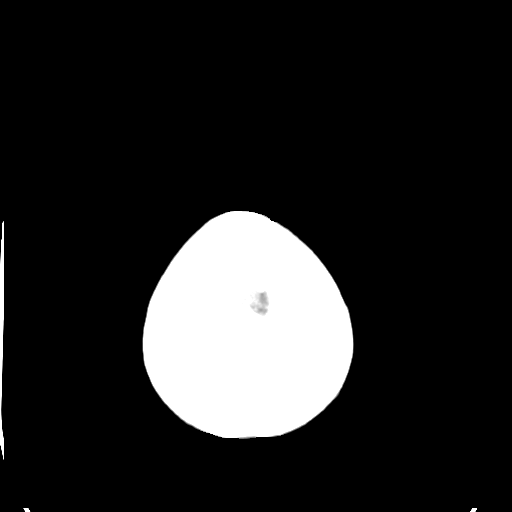

[Series 4: head bone · axial · 0.45mm/px · z∈[-72,-38]mm · 3 of 86 slices shown]
[im 9/86  bone]
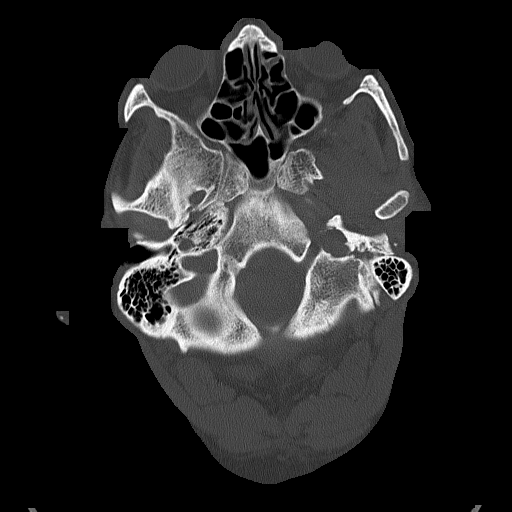
[im 18/86  bone]
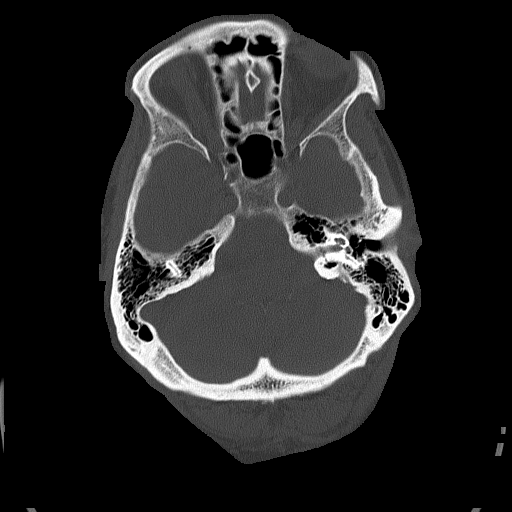
[im 26/86  bone]
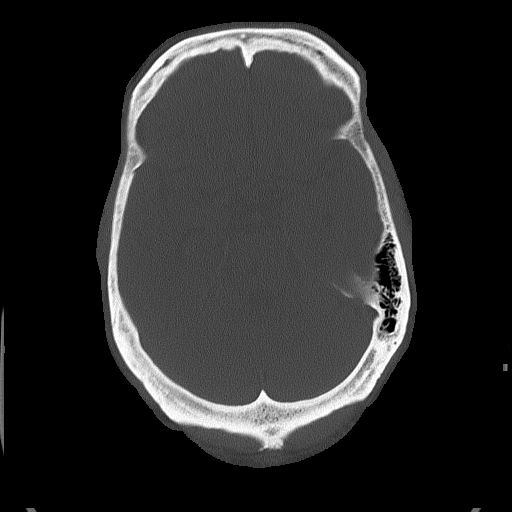

[Series 5: head without sag · sagittal · non-contrast · 0.33mm/px · 3 of 59 slices shown]
[im 20/59  brain]
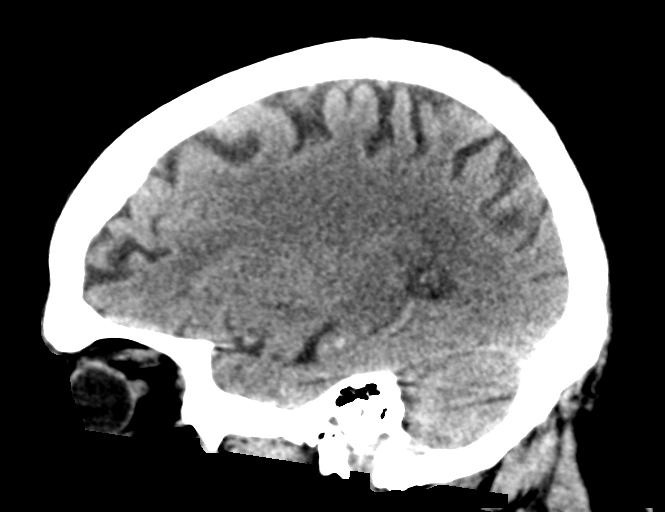
[im 30/59  brain]
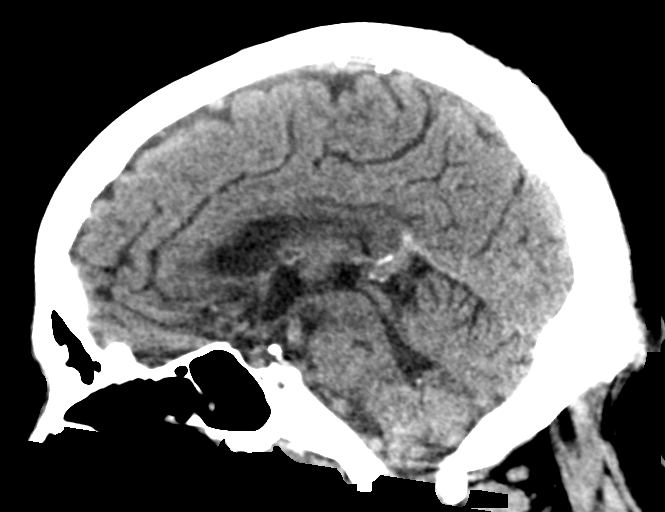
[im 39/59  brain]
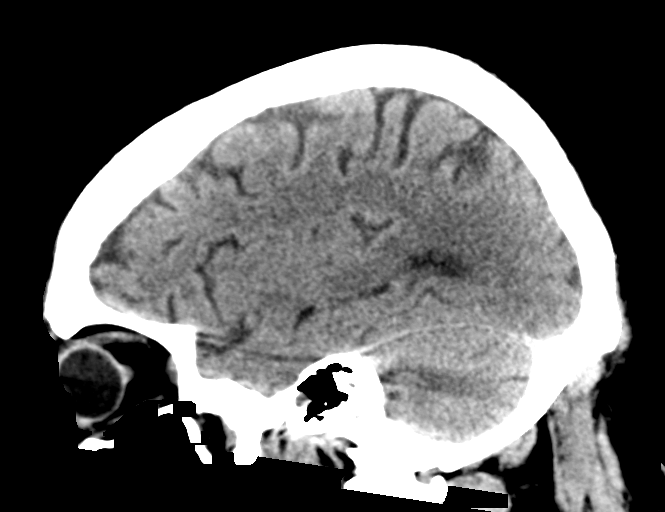

[Series 6: head without cor · coronal · non-contrast · 0.36mm/px · 3 of 86 slices shown]
[im 29/86  brain]
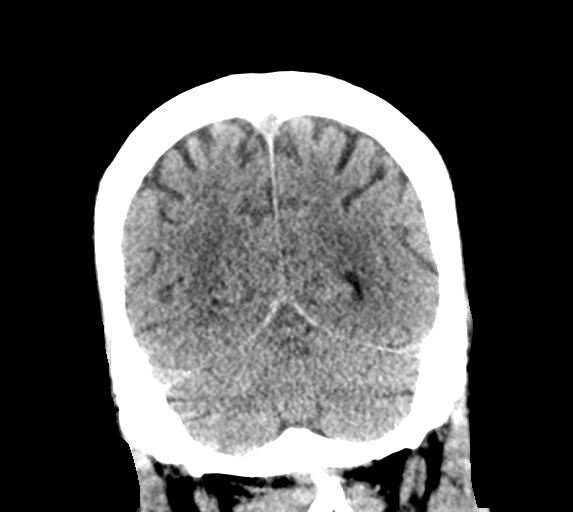
[im 38/86  brain]
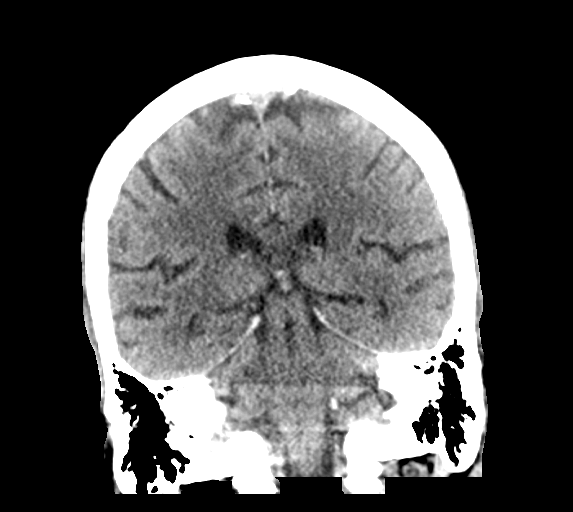
[im 48/86  brain]
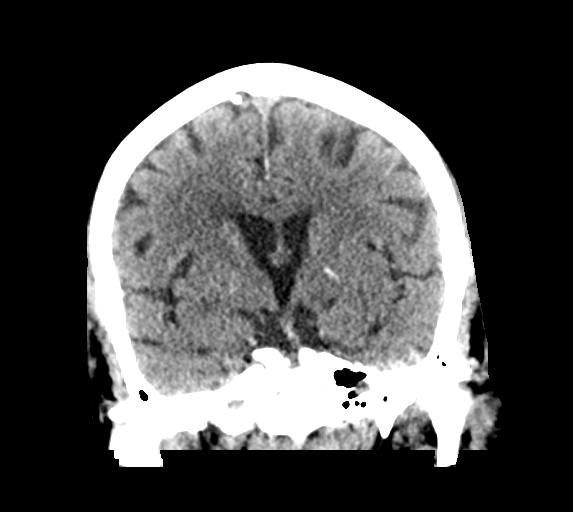

[16 of 47 positions shown; findings below may reference images not displayed]

FINDINGS: Brain: Mild chronic ischemic white matter disease is noted. No mass
effect or midline shift is noted. Ventricular size is within normal
limits. There is no evidence of mass lesion, hemorrhage or acute
infarction.

Vascular: No hyperdense vessel or unexpected calcification.

Skull: Normal. Negative for fracture or focal lesion.

Sinuses/Orbits: No acute finding.

Other: None.
IMPRESSION: Mild chronic ischemic white matter disease. No acute intracranial
abnormality seen.

## 2019-12-31 MED ORDER — MAGNESIUM SULFATE IN D5W 1-5 GM/100ML-% IV SOLN
1.0000 g | Freq: Once | INTRAVENOUS | Status: DC
  Filled 2019-12-31: qty 100

## 2019-12-31 MED ORDER — PROCHLORPERAZINE EDISYLATE 10 MG/2ML IJ SOLN
10.0000 mg | Freq: Once | INTRAMUSCULAR | Status: AC
  Administered 2019-12-31: 10 mg via INTRAVENOUS
  Filled 2019-12-31: qty 2

## 2019-12-31 MED ORDER — HYDROMORPHONE HCL 1 MG/ML IJ SOLN
0.5000 mg | Freq: Once | INTRAMUSCULAR | Status: AC
  Administered 2019-12-31: 0.5 mg via INTRAVENOUS
  Filled 2019-12-31: qty 1

## 2019-12-31 MED ORDER — CARBAMAZEPINE 200 MG PO TABS
200.0000 mg | ORAL_TABLET | Freq: Every day | ORAL | 0 refills | Status: DC
Start: 2019-12-31 — End: 2020-01-24

## 2019-12-31 MED ORDER — DIPHENHYDRAMINE HCL 50 MG/ML IJ SOLN
12.5000 mg | Freq: Once | INTRAMUSCULAR | Status: AC
  Administered 2019-12-31: 12.5 mg via INTRAVENOUS
  Filled 2019-12-31: qty 1

## 2019-12-31 MED ORDER — SODIUM CHLORIDE 0.9 % IV SOLN
250.0000 mg | Freq: Once | INTRAVENOUS | Status: AC
  Administered 2019-12-31: 250 mg via INTRAVENOUS
  Filled 2019-12-31: qty 5

## 2019-12-31 NOTE — ED Notes (Signed)
Pt removed pulse ox. Reapplied, 99% on room air.

## 2019-12-31 NOTE — ED Notes (Signed)
Pt returned from MRI °

## 2019-12-31 NOTE — ED Notes (Signed)
Pt transported to MRI 

## 2019-12-31 NOTE — ED Provider Notes (Signed)
[facility] EMERGENCY DEPARTMENT Provider Note   CSN: [id number] Arrival date & time: 12/30/19  2155     History Chief Complaint  Patient presents with  . Chest Pain    Headache    [name]. is a 69 y.o. male with history of ESRD on dialysis Monday Wednesday Friday, type 2 diabetes mellitus, hyperlipidemia, hypertension, OSA presents for evaluation of gradual onset, progressively worsening headache since 5 PM yesterday evening.  He reports that symptoms began while watching television.  Headache is left-sided, throbbing, worsens with standing.  Occasionally feels sharp pains on the left side.  He states that he feels his left upper and lower extremities were "shaking" when he ambulates.  Feels a little lightheaded with ambulation but denies shortness of breath.  Denies vision changes.  States that he typically does not get headaches and cannot remember the last time he had a similar headache.  He tells me that he thinks he might have an earache but points to his right ear.   He denies chest pain although triage note mentions that he arrived via EMS with report of left-sided chest pain with radiation to the left jaw and left arm and received aspirin and 2 sublingual nitroglycerin.  To me he denies chest pain.  He was told by family member that he might have a sinus infection so he took over-the-counter cold and flu medication without relief.  Denies fevers, abdominal pain, nausea, vomiting.  Last underwent dialysis on Wednesday but reports that he received an abbreviated treatment of 3 hours versus his usual 4.5 hours due to cramping.  He is a current smoker of 3 to 4 cigarettes daily, denies recreational drug use or excessive alcohol intake.    The history is provided by the patient.       Past Medical History:  Diagnosis Date  . Anemia   . Arthritis    left knee  . Chronic kidney disease    acute renal failure/injury requiring short-term HD 2013  . Colon  cancer ([place])   . Colon polyp    11/2011 - Polyps identified, biopsy - invasive adenocarinoma  . DM II (diabetes mellitus, type II), controlled ([place])    type 2 IDDM x 15 years. A1C 1/09 13.7.   Marland Kitchen 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 12/25/2011  . GERD (gastroesophageal reflux disease)   . Heart murmur   . 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 Hyperlipidemia   . Hypertension    for a few years and resolved in 2013/2014.  . Sleep apnea    does not wear CPAP now  . Wears glasses     Patient Active Problem List   Diagnosis Date Noted  . Symptomatic anemia 02/11/2019  . Type 2 diabetes mellitus with renal complication ([place]) 08/65/7846  . ESRD (end stage renal disease) ([place])   . Cardiac murmur 01/08/2019  . Preoperative clearance 01/08/2019  . Uncontrolled Type 2 DM 09/15/2013  . HYPERTENSION 03/08/2013  . CKD  06/24/2012  . Colon cancer, status Post resection  05/11/2012  . Health care maintenance 11/27/2011  . Obesity (BMI 30.0-34.9) 10/20/2011  . Cataract, bilateral 08/29/2011  . Hyperlipidemia LDL goal <70 04/14/2006  . MICROCYTIC ANEMIA 04/14/2006  . TOBACCO ABUSE 04/14/2006  . OBSTRUCTIVE SLEEP APNEA 04/14/2006    Past Surgical History:  Procedure Laterality Date  . APPLICATION OF WOUND VAC  02/16/2012   Procedure: APPLICATION OF WOUND VAC;  Surgeon: Zenovia Jarred, MD;  Location: [place];  Service: General;  Laterality: N/A;  . APPLICATION OF WOUND VAC  02/26/2012   Procedure: APPLICATION OF WOUND VAC;  Surgeon: Imogene Burn. [name], MD;  Location: [place];  Service: General;  Laterality: N/A;  . AV FISTULA PLACEMENT Left 07/29/2019   Procedure: ARTERIOVENOUS (AV) FISTULA CREATION;  Surgeon: Marty Heck, MD;  Location: [place];  Service: Vascular;  Laterality: Left;  . COLONOSCOPY    . COLONOSCOPY  02/06/2012   Procedure: COLONOSCOPY;  Surgeon: Milus Banister, MD;  Location: [place];  Service: Endoscopy;;  . COLONOSCOPY WITH PROPOFOL N/A 06/10/2019   Procedure: COLONOSCOPY  WITH PROPOFOL;  Surgeon: Milus Banister, MD;  Location: [facility];  Service: Endoscopy;  Laterality: N/A;  . COLOSTOMY REVERSAL    . DRESSING CHANGE UNDER ANESTHESIA  02/18/2012   Procedure: DRESSING CHANGE UNDER ANESTHESIA;  Surgeon: Odis Hollingshead, MD;  Location: [place];  Service: General;  Laterality: N/A;  . ESOPHAGOGASTRODUODENOSCOPY (EGD) WITH PROPOFOL N/A 06/10/2019   Procedure: ESOPHAGOGASTRODUODENOSCOPY (EGD) WITH PROPOFOL;  Surgeon: Milus Banister, MD;  Location: [facility];  Service: Endoscopy;  Laterality: N/A;  . GANGLION CYST EXCISION  20 YRS AGO   RT ARM  . ILEOSTOMY  02/16/2012   Procedure: ILEOSTOMY;  Surgeon: Zenovia Jarred, MD;  Location: [employer];  Service: General;  Laterality: N/A;  . ILEOSTOMY CLOSURE N/A 09/02/2012   Procedure: ILEOSTOMY REVERSAL;  Surgeon: Imogene Burn. [name], MD;  Location: [place];  Service: General;  Laterality: N/A;  . ILIOSTOMY    . INCISION AND DRAINAGE OF WOUND  02/23/2012   Procedure: IRRIGATION AND DEBRIDEMENT WOUND;  Surgeon: Joyice Faster. Cornett, MD;  Location: [place];  Service: General;  Laterality: N/A;  . LAPAROTOMY  02/16/2012   Procedure: EXPLORATORY LAPAROTOMY;  Surgeon: Zenovia Jarred, MD;  Location: [facility];  Service: General;  Laterality: N/A;  Exploratory Laparotomy with resection of anastomosis  . LAPAROTOMY  02/18/2012   Procedure: EXPLORATORY LAPAROTOMY;  Surgeon: Odis Hollingshead, MD;  Location: [place];  Service: General;  Laterality: N/A;  exploratory laparotomy  change of abdominal vac dressing  . LAPAROTOMY  02/20/2012   Procedure: EXPLORATORY LAPAROTOMY;  Surgeon: Odis Hollingshead, MD;  Location: [place];  Service: General;  Laterality: N/A;  . LAPAROTOMY  02/23/2012   Procedure: EXPLORATORY LAPAROTOMY;  Surgeon: Joyice Faster. Cornett, MD;  Location: [place];  Service: General;  Laterality: N/A;  Irrigation and Debridement of abdominal wound with wound vac change with partial closure  . LAPAROTOMY  02/26/2012   Procedure: EXPLORATORY  LAPAROTOMY;  Surgeon: Imogene Burn. [name], MD;  Location: [place];  Service: General;  Laterality: N/A;     . PARTIAL COLECTOMY  02/06/2012  . PARTIAL COLECTOMY  02/06/2012   Procedure: PARTIAL COLECTOMY;  Surgeon: Imogene Burn. [name], MD;  Location: [place];  Service: General;  Laterality: N/A;  right partial colectomy  . RESECTION SMALL BOWEL / CLOSURE ILEOSTOMY  402/2014   Dr [name]  . VACUUM ASSISTED CLOSURE CHANGE  02/20/2012   Procedure: ABDOMINAL VACUUM ASSISTED CLOSURE CHANGE;  Surgeon: Odis Hollingshead, MD;  Location: [place];  Service: General;;  . VACUUM ASSISTED CLOSURE CHANGE  02/23/2012   Procedure: ABDOMINAL VACUUM ASSISTED CLOSURE CHANGE;  Surgeon: Joyice Faster. Cornett, MD;  Location: [place] [facility];  Service: General;  Laterality: N/A;       Family History  Problem Relation Age of Onset  . Diabetes Father   . Heart disease Father   . Hypertension Mother   .  Diabetes Sister   . Heart disease Sister   . Colon cancer Neg Hx     Social History   Tobacco Use  . Smoking status: Current Every Day Smoker    Packs/day: 0.50    Years: 30.00    Pack years: 15.00    Types: Cigarettes  . Smokeless tobacco: Never Used  Substance Use Topics  . Alcohol use: Yes    Comment: Liquor twice monthly.  . Drug use: No    Home Medications Prior to Admission medications   Medication Sig Start Date End Date Taking? Authorizing Provider  amLODipine (NORVASC) 10 MG tablet Take 10 mg by mouth at bedtime.    Yes [provider]  atorvastatin (LIPITOR) 20 MG tablet Take 20 mg by mouth at bedtime.    Yes [provider]  insulin detemir (LEVEMIR) 100 UNIT/ML injection Inject 0.24 mLs (24 Units total) into the skin at bedtime. Patient taking differently: Inject 26 Units into the skin See admin instructions. Per sliding scale: Most is 22 units and least is 12 units. 12/08/13  Yes Boggala, Gaynelle Adu, MD  lidocaine-prilocaine (EMLA) cream Apply 1 application topically once. Apply to needle site 1  hr. Prior to cannulation for hemodialysis.   Yes [provider]  metoprolol tartrate (LOPRESSOR) 50 MG tablet Take 50 mg by mouth 2 (two) times daily.    Yes [provider]  multivitamin (RENA-VIT) TABS tablet Take 1 tablet by mouth daily.   Yes [provider]  betamethasone dipropionate 0.05 % cream Apply 1 application topically 2 (two) times daily as needed (skin irritation; psoriasis).    [provider]  carbamazepine (TEGRETOL) 200 MG tablet Take 1 tablet (200 mg total) by mouth daily. 12/31/19 01/30/20  Rodell Perna A, PA-C  HYDROcodone-acetaminophen (NORCO/VICODIN) 5-325 MG tablet Take 1 tablet by mouth every 4 (four) hours as needed for moderate pain. Patient not taking: Reported on 12/31/2019 07/29/19 07/28/20  Barbie Banner, PA-C    Allergies    Patient has no known allergies.  Review of Systems   Review of Systems  Constitutional: Negative for chills and fever.  HENT: Positive for ear pain.   Eyes: Negative for visual disturbance.  Respiratory: Negative for shortness of breath.   Cardiovascular: Negative for chest pain.  Gastrointestinal: Negative for abdominal pain, nausea and vomiting.  Neurological: Positive for tremors, light-headedness and headaches. Negative for syncope.  All other systems reviewed and are negative.   Physical Exam Updated Vital Signs BP (!) 160/86   Pulse 88   Temp 98.6 F (37 C) (Oral)   Resp 14   Ht 5\' 11"  (1.803 m)   Wt 90 kg   SpO2 100%   BMI 27.67 kg/m   Physical Exam Vitals and nursing note reviewed.  Constitutional:      General: He is not in acute distress.    Appearance: He is well-developed.  HENT:     Head: Normocephalic and atraumatic.     Comments: Cries out in pain upon insertion of the speculum into the right ear canal.  No canal edema or drainage.  TM without erythema, bulging, or perforation.  No mastoid tenderness bilaterally.    Right Ear: Tympanic membrane normal. Tenderness  present. No mastoid tenderness.     Left Ear: Tympanic membrane normal. No mastoid tenderness.     Nose: No congestion.     Right Sinus: No maxillary sinus tenderness or frontal sinus tenderness.     Left Sinus: No maxillary sinus  tenderness or frontal sinus tenderness.  Eyes:     General:        Right eye: No discharge.        Left eye: No discharge.     Extraocular Movements: Extraocular movements intact.     Conjunctiva/sclera: Conjunctivae normal.     Pupils: Pupils are equal, round, and reactive to light.  Neck:     Vascular: No JVD.     Trachea: No tracheal deviation.  Cardiovascular:     Rate and Rhythm: Normal rate and regular rhythm.     Comments: AV fistula to left upper extremity with palpable thrill Pulmonary:     Effort: Pulmonary effort is normal.  Chest:     Chest wall: No tenderness.  Abdominal:     General: There is no distension.     Palpations: Abdomen is soft.     Tenderness: There is no abdominal tenderness. There is no guarding or rebound.  Musculoskeletal:     Cervical back: Neck supple.     Right lower leg: No tenderness. No edema.     Left lower leg: No tenderness. No edema.  Skin:    General: Skin is warm.     Findings: No erythema.  Neurological:     Mental Status: He is alert.     Comments: Mental Status:  Alert, thought content appropriate, able to give a coherent history.  Appears mildly drowsy.  Speech fluent without evidence of aphasia, though speaks with mild dysarthria. Able to follow 2 step commands without difficulty.  Cranial Nerves:  II:  Peripheral visual fields grossly normal, pupils equal, round, reactive to light III,IV, VI: ptosis not present, extra-ocular motions intact bilaterally  V,VII: smile symmetric, facial light touch sensation equal VIII: hearing grossly normal to voice  X: uvula elevates symmetrically  XI: bilateral shoulder shrug symmetric and strong XII: midline tongue extension without fassiculations Motor:  Normal  tone. 5/5 strength of BUE and BLE major muscle groups including strong and equal grip strength and dorsiflexion/plantar flexion Sensory: light touch normal in all extremities. Gait: normal gait and balance.  No tremulousness noted.   Psychiatric:        Behavior: Behavior normal.     ED Results / Procedures / Treatments   Labs (all labs ordered are listed, but only abnormal results are displayed) Labs Reviewed  BASIC METABOLIC PANEL - Abnormal; Notable for the following components:      Result Value   Sodium 132 (*)    Potassium 3.3 (*)    Chloride 96 (*)    Glucose, Bld 150 (*)    BUN 71 (*)    Creatinine, Ser 14.32 (*)    Calcium 8.3 (*)    GFR calc non Af Amer 3 (*)    GFR calc Af Amer 4 (*)    All other components within normal limits  CBC - Abnormal; Notable for the following components:   WBC 15.0 (*)    RBC 3.33 (*)    Hemoglobin 8.6 (*)    HCT 25.6 (*)    MCV 76.9 (*)    MCH 25.8 (*)    RDW 17.0 (*)    All other components within normal limits  PROTIME-INR - Abnormal; Notable for the following components:   Prothrombin Time 16.1 (*)    INR 1.3 (*)    All other components within normal limits  TROPONIN I (HIGH SENSITIVITY) - Abnormal; Notable for the following components:   Troponin I (High Sensitivity) 49 (*)  All other components within normal limits  TROPONIN I (HIGH SENSITIVITY) - Abnormal; Notable for the following components:   Troponin I (High Sensitivity) 39 (*)    All other components within normal limits  TROPONIN I (HIGH SENSITIVITY)    EKG EKG Interpretation  Date/Time:  Friday December 31 2019 07:54:45 EDT Ventricular Rate:  70 PR Interval:  208 QRS Duration: 105 QT Interval:  413 QTC Calculation: 446 R Axis:   41 Text Interpretation: Sinus rhythm Nonspecific ST abnormality Confirmed by Lajean Saver [phone]) on 12/31/2019 9:16:40 AM   Radiology DG Chest 2 View  Result Date: 12/30/2019 CLINICAL DATA:  Chest pain. EXAM: CHEST - 2 VIEW  COMPARISON:  02/11/2019 FINDINGS: Right-sided dialysis catheter tip in the lower SVC. Upper normal heart size with improved cardiomegaly from prior exam. Minor basilar atelectasis. No confluent airspace disease. No pulmonary edema. No pleural effusion or pneumothorax. No acute osseous abnormalities are seen. IMPRESSION: Minor basilar atelectasis. Electronically Signed   By: Keith Rake M.D.   On: 12/30/2019 22:27   CT Head Wo Contrast  Result Date: 12/31/2019 CLINICAL DATA:  Headache. EXAM: CT HEAD WITHOUT CONTRAST TECHNIQUE: Contiguous axial images were obtained from the base of the skull through the vertex without intravenous contrast. COMPARISON:  None. FINDINGS: Brain: Mild chronic ischemic white matter disease is noted. No mass effect or midline shift is noted. Ventricular size is within normal limits. There is no evidence of mass lesion, hemorrhage or acute infarction. Vascular: No hyperdense vessel or unexpected calcification. Skull: Normal. Negative for fracture or focal lesion. Sinuses/Orbits: No acute finding. Other: None. IMPRESSION: Mild chronic ischemic white matter disease. No acute intracranial abnormality seen. Electronically Signed   By: Marijo Conception M.D.   On: 12/31/2019 09:19   MR BRAIN WO CONTRAST  Result Date: 12/31/2019 CLINICAL DATA:  Headache.  Difficulty walking.  Regional pain. EXAM: MRI HEAD WITHOUT CONTRAST TECHNIQUE: Multiplanar, multiecho pulse sequences of the brain and surrounding structures were obtained without intravenous contrast. COMPARISON:  Head CT same day FINDINGS: Brain: Diffusion imaging does not show any acute or subacute infarction. Old small vessel infarction in the right para median pons. No focal cerebellar finding. Cerebral hemispheres show an old lacunar infarction in the medial thalamus bilaterally. There are moderate chronic small-vessel ischemic changes of the cerebral hemispheric white matter. No cortical or large vessel territory infarction. No  mass lesion, hemorrhage, hydrocephalus or extra-axial collection. No lesion seen of the perimesencephalic cisterns. Vascular: Major vessels at the base of the brain show flow. Skull and upper cervical spine: Negative Sinuses/Orbits: Clear/normal Other: None IMPRESSION: No acute, subacute or reversible finding. 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 Electronically Signed   By: Nelson Chimes M.D.   On: 12/31/2019 12:27    Procedures Procedures (including critical care time)  Medications Ordered in ED Medications  phenytoin (DILANTIN) 250 mg in sodium chloride 0.9 % 100 mL IVPB (has no administration in time range)  sodium chloride flush (NS) 0.9 % injection 3 mL (3 mLs Intravenous Given 12/31/19 0840)  prochlorperazine (COMPAZINE) injection 10 mg (10 mg Intravenous Given 12/31/19 0921)  diphenhydrAMINE (BENADRYL) injection 12.5 mg (12.5 mg Intravenous Given 12/31/19 0921)  HYDROmorphone (DILAUDID) injection 0.5 mg (0.5 mg Intravenous Given 12/31/19 1051)    ED Course  I have reviewed the triage vital signs and the nursing notes.  Pertinent labs & imaging results that were available during my care of the patient were reviewed by me and considered  in my medical decision making (see chart for details).    MDM Rules/Calculators/A&P                          Patient presenting for evaluation of gradual onset headache beginning yesterday.  He was triaged as having complaint of chest pain but he vehemently denies this and daughter who eventually showed up at the bedside also corroborates this.  He is afebrile, intermittently hypertensive in the ED but is due for dialysis today.  He is mildly drowsy and states "I was in the waiting room for hours overnight".  He is oriented to person place and time.  Nonfocal neurologic examination with no significant deficits noted and he is ambulatory in the ED without difficulty.  Headache was gradual in onset, currently  low suspicion of subarachnoid hemorrhage or intracranial hemorrhage.  No fever or meningeal signs to suggest meningitis.  He underwent chest pain work-up in the waiting room.  EKG shows no acute ischemic abnormalities and serial troponins are mildly elevated but downtrending and this is likely reflective of troponin leak in the setting of his ESRD.  Chest x-ray shows mild bibasilar atelectasis but no significant edema or cardiomegaly, no evidence of pneumonia or pneumothorax.  Doubt dissection, cardiac tamponade, esophageal rupture.  He continues to deny chest pain while in the ED.  He underwent head CT which shows no evidence of acute intracranial hemorrhage he subsequently underwent MRI which showed no evidence of acute, subacute, or reversible findings.  He does have old small vessel infarctions involving the pons, thalamus and cerebral hemispheric white matter. He occasionally cries out in pain and states it feels as though he is experiencing electric shocks to the right side of the face.  He has tenderness to percussion overlying the trigeminal nerve root region.  Concern for possible trigeminal neuralgia versus possible early shingles.  Spoke with Dr. Cheral Marker with neurology; he recommends giving phenytoin in the ED for rescue therapy and starting the patient on low-dose carbamazepine; also recommends outpatient neurology follow-up for further evaluation and management.  I spoke with Terri Piedra, dialysis coordinator who has contacted the patient's dialysis facility and notes that they are able to fit him in for dialysis today as he missed his appointment today while completing workup in the ED. unfortunately, the patient refuses dialysis today stating "I just do not feel well enough". I did emphasize to him the importance of compliance with his dialysis regimen and that he did not receive the full course of dialysis on Wednesday. We discussed that he would likely benefit from dialysis and would  probably feel better afterwards but he is adamant that he does not want to go. He refuses dialysis here in the ED. Spoke with Jaclyn Shaggy again who contacted the patient's dialysis facility and notes they are not able to fit him in for treatment tomorrow. The patient and I had a long discussion regarding the risks of forgoing treatment today; he understands the significant potential morbidity and mortality associated with forgoing his treatment. He exhibits capacity to make these decisions. Discussion was witnessed by the patient's daughter Debroah Baller. Informed Dr. Ashok Cordia. Patient understands he is forgoing dialysis treatment Against Medical Advice. Recommend follow-up with neurology outpatient for further evaluation and management. Patient and daughter verbalized understanding of and agreement with plan and patient is hemodynamically stable for discharge at this time.   Final Clinical Impression(s) / ED Diagnoses Final diagnoses:  Bad headache    Rx /  DC Orders ED Discharge Orders         Ordered    carbamazepine (TEGRETOL) 200 MG tablet  Daily     Discontinue  Reprint     12/31/19 1306    Ambulatory referral to Neurology     Discontinue  Reprint    Comments: An appointment is requested in approximately: 1 week   12/31/19 1306           Renita Papa, PA-C 12/31/19 1428    Lajean Saver, MD 12/31/19 1537

## 2019-12-31 NOTE — Progress Notes (Signed)
Renal Navigator received call from EDP/M. Fawze of patient's discharge from ED today, having missed his HD due to being in the ED. Navigator spoke with Agricultural consultant at [facility], who said to have him be sent straight to [facility] for treatment today.  Navigator received a call back from PA stating that patient was refusing HD today. Navigator inquired with clinic as to whether or not they are able to accommodate patient tomorrow, but they do not have any space available. Navigator informed PA. Navigator later received a call from [place] who states she will contact patient directly tomorrow if any availability opens up for treatment tomorrow at patient's home clinic or a near-by clinic. Navigator appreciative.   Alphonzo Cruise,  Renal Navigator [phone]

## 2019-12-31 NOTE — Discharge Instructions (Signed)
Your imaging today showed no evidence of stroke, brain bleed, or other brain abnormalities that could be causing your headache.  Your work-up today is suggestive of possible trigeminal neuralgia (see attached information regarding this condition).  This can be managed with a medication called carbamazepine to be taken once daily.  Be aware this medication can cause drowsiness.  We recommend follow-up with neurology on an outpatient basis for reevaluation and further management.  Your symptoms could also potentially indicate the start of shingles, so please monitor for any rash or other concerning signs.  You refused dialysis today.  We had a long discussion and you know that this is not our recommendation and that there could be serious negative consequences to foregoing dialysis today including death.  Our dialysis coordinator has called your facility and they have no availability for dialysis tomorrow. They do have availability today.   We also offered to test you for Covid today which she refused.  If you develop any Covid symptoms, it may be beneficial to seek testing to confirm possible diagnosis.  Return to the emergency department if any concerning signs or symptoms develop such as fevers, vomiting, shortness of breath, chest pain, loss of consciousness or altered mental status

## 2019-12-31 NOTE — ED Notes (Signed)
Patient verbalizes understanding of discharge instructions. Opportunity for questioning and answers were provided. Pt discharged from ED. 

## 2020-01-03 ENCOUNTER — Emergency Department (HOSPITAL_COMMUNITY)
Admission: EM | Admit: 2020-01-03 | Discharge: 2020-01-03 | Disposition: A | Discharge status: Home / Self Care | Payer: No Typology Code available for payment source | Source: Home / Self Care

## 2020-01-03 ENCOUNTER — Other Ambulatory Visit: Payer: Self-pay

## 2020-01-03 DIAGNOSIS — Z992 Dependence on renal dialysis: Secondary | ICD-10-CM | POA: Insufficient documentation

## 2020-01-03 DIAGNOSIS — R519 Headache, unspecified: Secondary | ICD-10-CM | POA: Insufficient documentation

## 2020-01-03 DIAGNOSIS — Z20822 Contact with and (suspected) exposure to covid-19: Secondary | ICD-10-CM | POA: Insufficient documentation

## 2020-01-03 DIAGNOSIS — G062 Extradural and subdural abscess, unspecified: Secondary | ICD-10-CM | POA: Diagnosis not present

## 2020-01-03 DIAGNOSIS — Z5321 Procedure and treatment not carried out due to patient leaving prior to being seen by health care provider: Secondary | ICD-10-CM | POA: Insufficient documentation

## 2020-01-03 DIAGNOSIS — A4102 Sepsis due to Methicillin resistant Staphylococcus aureus: Secondary | ICD-10-CM | POA: Diagnosis not present

## 2020-01-03 DIAGNOSIS — R531 Weakness: Secondary | ICD-10-CM | POA: Insufficient documentation

## 2020-01-03 LAB — CBC
HCT: 27.4 % — ABNORMAL LOW (ref 39.0–52.0)
Hemoglobin: 9.5 g/dL — ABNORMAL LOW (ref 13.0–17.0)
MCH: 25.7 pg — ABNORMAL LOW (ref 26.0–34.0)
MCHC: 34.7 g/dL (ref 30.0–36.0)
MCV: 74.1 fL — ABNORMAL LOW (ref 80.0–100.0)
Platelets: 276 10*3/uL (ref 150–400)
RBC: 3.7 MIL/uL — ABNORMAL LOW (ref 4.22–5.81)
RDW: 16.9 % — ABNORMAL HIGH (ref 11.5–15.5)
WBC: 25.8 10*3/uL — ABNORMAL HIGH (ref 4.0–10.5)
nRBC: 0 % (ref 0.0–0.2)

## 2020-01-03 LAB — BASIC METABOLIC PANEL
Anion gap: 16 — ABNORMAL HIGH (ref 5–15)
BUN: 87 mg/dL — ABNORMAL HIGH (ref 8–23)
CO2: 23 mmol/L (ref 22–32)
Calcium: 8.5 mg/dL — ABNORMAL LOW (ref 8.9–10.3)
Chloride: 93 mmol/L — ABNORMAL LOW (ref 98–111)
Creatinine, Ser: 14.34 mg/dL — ABNORMAL HIGH (ref 0.61–1.24)
GFR calc Af Amer: 4 mL/min — ABNORMAL LOW (ref >60)
GFR calc non Af Amer: 3 mL/min — ABNORMAL LOW (ref >60)
Glucose, Bld: 175 mg/dL — ABNORMAL HIGH (ref 70–99)
Potassium: 3.8 mmol/L (ref 3.5–5.1)
Sodium: 132 mmol/L — ABNORMAL LOW (ref 135–145)

## 2020-01-03 NOTE — ED Triage Notes (Signed)
Pt reports headache since Friday with generalized weakness. Also sts while he was at dialysis today the staff told him his "eyes were blinking more than normal." Denies shob. MWF dialysis pt, had only one hour of tx today d/t not feeling well. Pt falling asleep during triage.

## 2020-01-03 NOTE — ED Notes (Signed)
Called for vitals x3, no answer. 

## 2020-01-04 ENCOUNTER — Emergency Department (HOSPITAL_COMMUNITY): Payer: No Typology Code available for payment source

## 2020-01-04 ENCOUNTER — Inpatient Hospital Stay (HOSPITAL_COMMUNITY)
Admission: EM | Admit: 2020-01-04 | Discharge: 2020-01-24 | DRG: 853 | Disposition: A | Discharge status: Skilled Nursing Facility | Payer: No Typology Code available for payment source | Attending: Internal Medicine | Admitting: Internal Medicine

## 2020-01-04 ENCOUNTER — Other Ambulatory Visit: Payer: Self-pay

## 2020-01-04 ENCOUNTER — Encounter (HOSPITAL_COMMUNITY): Payer: Self-pay | Admitting: *Deleted

## 2020-01-04 ENCOUNTER — Telehealth (HOSPITAL_COMMUNITY): Payer: Self-pay | Admitting: Nephrology

## 2020-01-04 DIAGNOSIS — R7881 Bacteremia: Secondary | ICD-10-CM | POA: Diagnosis not present

## 2020-01-04 DIAGNOSIS — F05 Delirium due to known physiological condition: Secondary | ICD-10-CM | POA: Diagnosis not present

## 2020-01-04 DIAGNOSIS — Z85038 Personal history of other malignant neoplasm of large intestine: Secondary | ICD-10-CM

## 2020-01-04 DIAGNOSIS — I132 Hypertensive heart and chronic kidney disease with heart failure and with stage 5 chronic kidney disease, or end stage renal disease: Secondary | ICD-10-CM | POA: Diagnosis not present

## 2020-01-04 DIAGNOSIS — Z789 Other specified health status: Secondary | ICD-10-CM

## 2020-01-04 DIAGNOSIS — Z794 Long term (current) use of insulin: Secondary | ICD-10-CM

## 2020-01-04 DIAGNOSIS — N186 End stage renal disease: Secondary | ICD-10-CM | POA: Diagnosis not present

## 2020-01-04 DIAGNOSIS — E785 Hyperlipidemia, unspecified: Secondary | ICD-10-CM | POA: Diagnosis present

## 2020-01-04 DIAGNOSIS — D631 Anemia in chronic kidney disease: Secondary | ICD-10-CM | POA: Diagnosis present

## 2020-01-04 DIAGNOSIS — K219 Gastro-esophageal reflux disease without esophagitis: Secondary | ICD-10-CM | POA: Diagnosis present

## 2020-01-04 DIAGNOSIS — M4802 Spinal stenosis, cervical region: Secondary | ICD-10-CM | POA: Diagnosis present

## 2020-01-04 DIAGNOSIS — Y841 Kidney dialysis as the cause of abnormal reaction of the patient, or of later complication, without mention of misadventure at the time of the procedure: Secondary | ICD-10-CM | POA: Diagnosis not present

## 2020-01-04 DIAGNOSIS — M48061 Spinal stenosis, lumbar region without neurogenic claudication: Secondary | ICD-10-CM | POA: Diagnosis present

## 2020-01-04 DIAGNOSIS — G92 Toxic encephalopathy: Secondary | ICD-10-CM | POA: Diagnosis not present

## 2020-01-04 DIAGNOSIS — F17200 Nicotine dependence, unspecified, uncomplicated: Secondary | ICD-10-CM | POA: Diagnosis present

## 2020-01-04 DIAGNOSIS — F1410 Cocaine abuse, uncomplicated: Secondary | ICD-10-CM | POA: Diagnosis present

## 2020-01-04 DIAGNOSIS — G9341 Metabolic encephalopathy: Secondary | ICD-10-CM | POA: Diagnosis not present

## 2020-01-04 DIAGNOSIS — I120 Hypertensive chronic kidney disease with stage 5 chronic kidney disease or end stage renal disease: Secondary | ICD-10-CM | POA: Diagnosis not present

## 2020-01-04 DIAGNOSIS — T827XXA Infection and inflammatory reaction due to other cardiac and vascular devices, implants and grafts, initial encounter: Secondary | ICD-10-CM

## 2020-01-04 DIAGNOSIS — T8089XA Other complications following infusion, transfusion and therapeutic injection, initial encounter: Secondary | ICD-10-CM | POA: Diagnosis not present

## 2020-01-04 DIAGNOSIS — Z992 Dependence on renal dialysis: Secondary | ICD-10-CM

## 2020-01-04 DIAGNOSIS — E1165 Type 2 diabetes mellitus with hyperglycemia: Secondary | ICD-10-CM | POA: Diagnosis not present

## 2020-01-04 DIAGNOSIS — E8889 Other specified metabolic disorders: Secondary | ICD-10-CM | POA: Diagnosis present

## 2020-01-04 DIAGNOSIS — G061 Intraspinal abscess and granuloma: Secondary | ICD-10-CM | POA: Diagnosis present

## 2020-01-04 DIAGNOSIS — G062 Extradural and subdural abscess, unspecified: Secondary | ICD-10-CM | POA: Diagnosis not present

## 2020-01-04 DIAGNOSIS — Z20822 Contact with and (suspected) exposure to covid-19: Secondary | ICD-10-CM | POA: Diagnosis present

## 2020-01-04 DIAGNOSIS — F1010 Alcohol abuse, uncomplicated: Secondary | ICD-10-CM | POA: Diagnosis present

## 2020-01-04 DIAGNOSIS — A4102 Sepsis due to Methicillin resistant Staphylococcus aureus: Secondary | ICD-10-CM | POA: Diagnosis not present

## 2020-01-04 DIAGNOSIS — Z833 Family history of diabetes mellitus: Secondary | ICD-10-CM

## 2020-01-04 DIAGNOSIS — I959 Hypotension, unspecified: Secondary | ICD-10-CM | POA: Diagnosis not present

## 2020-01-04 DIAGNOSIS — Z9049 Acquired absence of other specified parts of digestive tract: Secondary | ICD-10-CM

## 2020-01-04 DIAGNOSIS — E1122 Type 2 diabetes mellitus with diabetic chronic kidney disease: Secondary | ICD-10-CM | POA: Diagnosis present

## 2020-01-04 DIAGNOSIS — J9601 Acute respiratory failure with hypoxia: Secondary | ICD-10-CM | POA: Diagnosis not present

## 2020-01-04 DIAGNOSIS — E873 Alkalosis: Secondary | ICD-10-CM | POA: Diagnosis not present

## 2020-01-04 DIAGNOSIS — I10 Essential (primary) hypertension: Secondary | ICD-10-CM | POA: Diagnosis not present

## 2020-01-04 DIAGNOSIS — U071 COVID-19: Secondary | ICD-10-CM | POA: Diagnosis not present

## 2020-01-04 DIAGNOSIS — R7989 Other specified abnormal findings of blood chemistry: Secondary | ICD-10-CM | POA: Diagnosis not present

## 2020-01-04 DIAGNOSIS — A419 Sepsis, unspecified organism: Secondary | ICD-10-CM | POA: Diagnosis present

## 2020-01-04 DIAGNOSIS — G4733 Obstructive sleep apnea (adult) (pediatric): Secondary | ICD-10-CM | POA: Diagnosis present

## 2020-01-04 DIAGNOSIS — Z0189 Encounter for other specified special examinations: Secondary | ICD-10-CM

## 2020-01-04 DIAGNOSIS — Z79899 Other long term (current) drug therapy: Secondary | ICD-10-CM

## 2020-01-04 DIAGNOSIS — J9690 Respiratory failure, unspecified, unspecified whether with hypoxia or hypercapnia: Secondary | ICD-10-CM

## 2020-01-04 DIAGNOSIS — T82528A Displacement of other cardiac and vascular devices and implants, initial encounter: Secondary | ICD-10-CM

## 2020-01-04 DIAGNOSIS — I5032 Chronic diastolic (congestive) heart failure: Secondary | ICD-10-CM | POA: Diagnosis not present

## 2020-01-04 DIAGNOSIS — B9562 Methicillin resistant Staphylococcus aureus infection as the cause of diseases classified elsewhere: Secondary | ICD-10-CM | POA: Diagnosis not present

## 2020-01-04 DIAGNOSIS — R1900 Intra-abdominal and pelvic swelling, mass and lump, unspecified site: Secondary | ICD-10-CM

## 2020-01-04 DIAGNOSIS — Z9289 Personal history of other medical treatment: Secondary | ICD-10-CM

## 2020-01-04 DIAGNOSIS — F17210 Nicotine dependence, cigarettes, uncomplicated: Secondary | ICD-10-CM | POA: Diagnosis present

## 2020-01-04 DIAGNOSIS — N2581 Secondary hyperparathyroidism of renal origin: Secondary | ICD-10-CM | POA: Diagnosis present

## 2020-01-04 DIAGNOSIS — G9340 Encephalopathy, unspecified: Secondary | ICD-10-CM | POA: Diagnosis present

## 2020-01-04 DIAGNOSIS — T827XXD Infection and inflammatory reaction due to other cardiac and vascular devices, implants and grafts, subsequent encounter: Secondary | ICD-10-CM | POA: Diagnosis not present

## 2020-01-04 DIAGNOSIS — Z8249 Family history of ischemic heart disease and other diseases of the circulatory system: Secondary | ICD-10-CM

## 2020-01-04 DIAGNOSIS — Z419 Encounter for procedure for purposes other than remedying health state, unspecified: Secondary | ICD-10-CM

## 2020-01-04 DIAGNOSIS — C189 Malignant neoplasm of colon, unspecified: Secondary | ICD-10-CM | POA: Diagnosis present

## 2020-01-04 LAB — CBC WITH DIFFERENTIAL/PLATELET
Abs Immature Granulocytes: 0.28 10*3/uL — ABNORMAL HIGH (ref 0.00–0.07)
Basophils Absolute: 0 10*3/uL (ref 0.0–0.1)
Basophils Relative: 0 %
Eosinophils Absolute: 0 10*3/uL (ref 0.0–0.5)
Eosinophils Relative: 0 %
HCT: 28.6 % — ABNORMAL LOW (ref 39.0–52.0)
Hemoglobin: 10 g/dL — ABNORMAL LOW (ref 13.0–17.0)
Immature Granulocytes: 1 %
Lymphocytes Relative: 4 %
Lymphs Abs: 1 10*3/uL (ref 0.7–4.0)
MCH: 26.4 pg (ref 26.0–34.0)
MCHC: 35 g/dL (ref 30.0–36.0)
MCV: 75.5 fL — ABNORMAL LOW (ref 80.0–100.0)
Monocytes Absolute: 1.5 10*3/uL — ABNORMAL HIGH (ref 0.1–1.0)
Monocytes Relative: 6 %
Neutro Abs: 22.5 10*3/uL — ABNORMAL HIGH (ref 1.7–7.7)
Neutrophils Relative %: 89 %
Platelets: 333 10*3/uL (ref 150–400)
RBC: 3.79 MIL/uL — ABNORMAL LOW (ref 4.22–5.81)
RDW: 17.1 % — ABNORMAL HIGH (ref 11.5–15.5)
WBC: 25.3 10*3/uL — ABNORMAL HIGH (ref 4.0–10.5)
nRBC: 0 % (ref 0.0–0.2)

## 2020-01-04 LAB — COMPREHENSIVE METABOLIC PANEL
ALT: 20 U/L (ref 0–44)
AST: 21 U/L (ref 15–41)
Albumin: 3.2 g/dL — ABNORMAL LOW (ref 3.5–5.0)
Alkaline Phosphatase: 84 U/L (ref 38–126)
Anion gap: 18 — ABNORMAL HIGH (ref 5–15)
BUN: 96 mg/dL — ABNORMAL HIGH (ref 8–23)
CO2: 23 mmol/L (ref 22–32)
Calcium: 8.7 mg/dL — ABNORMAL LOW (ref 8.9–10.3)
Chloride: 90 mmol/L — ABNORMAL LOW (ref 98–111)
Creatinine, Ser: 15.45 mg/dL — ABNORMAL HIGH (ref 0.61–1.24)
GFR calc Af Amer: 3 mL/min — ABNORMAL LOW (ref >60)
GFR calc non Af Amer: 3 mL/min — ABNORMAL LOW (ref >60)
Glucose, Bld: 233 mg/dL — ABNORMAL HIGH (ref 70–99)
Potassium: 4.4 mmol/L (ref 3.5–5.1)
Sodium: 131 mmol/L — ABNORMAL LOW (ref 135–145)
Total Bilirubin: 1.1 mg/dL (ref 0.3–1.2)
Total Protein: 9.1 g/dL — ABNORMAL HIGH (ref 6.5–8.1)

## 2020-01-04 LAB — URINALYSIS, ROUTINE W REFLEX MICROSCOPIC
Bilirubin Urine: NEGATIVE
Glucose, UA: 50 mg/dL — AB
Ketones, ur: NEGATIVE mg/dL
Leukocytes,Ua: NEGATIVE
Nitrite: NEGATIVE
Protein, ur: >=300 mg/dL — AB
Specific Gravity, Urine: 1.012 (ref 1.005–1.030)
pH: 5 (ref 5.0–8.0)

## 2020-01-04 LAB — LACTIC ACID, PLASMA: Lactic Acid, Venous: 1.7 mmol/L (ref 0.5–1.9)

## 2020-01-04 LAB — TYPE AND SCREEN
ABO/RH(D): AB POS
Antibody Screen: NEGATIVE

## 2020-01-04 LAB — PROTIME-INR
INR: 1.3 — ABNORMAL HIGH (ref 0.8–1.2)
Prothrombin Time: 15.6 seconds — ABNORMAL HIGH (ref 11.4–15.2)

## 2020-01-04 LAB — CBG MONITORING, ED: Glucose-Capillary: 192 mg/dL — ABNORMAL HIGH (ref 70–99)

## 2020-01-04 LAB — SARS CORONAVIRUS 2 BY RT PCR (HOSPITAL ORDER, PERFORMED IN ~~LOC~~ HOSPITAL LAB): SARS Coronavirus 2: NEGATIVE

## 2020-01-04 IMAGING — CR DG CHEST 2V
4 series · 4 of 4 positions shown · non-contrast
Comparison: Radiograph [DATE]

CLINICAL DATA: Back pain, weakness and shortness of breath

EXAM:
CHEST - 2 VIEW

[w chest lat (1 of 2)]
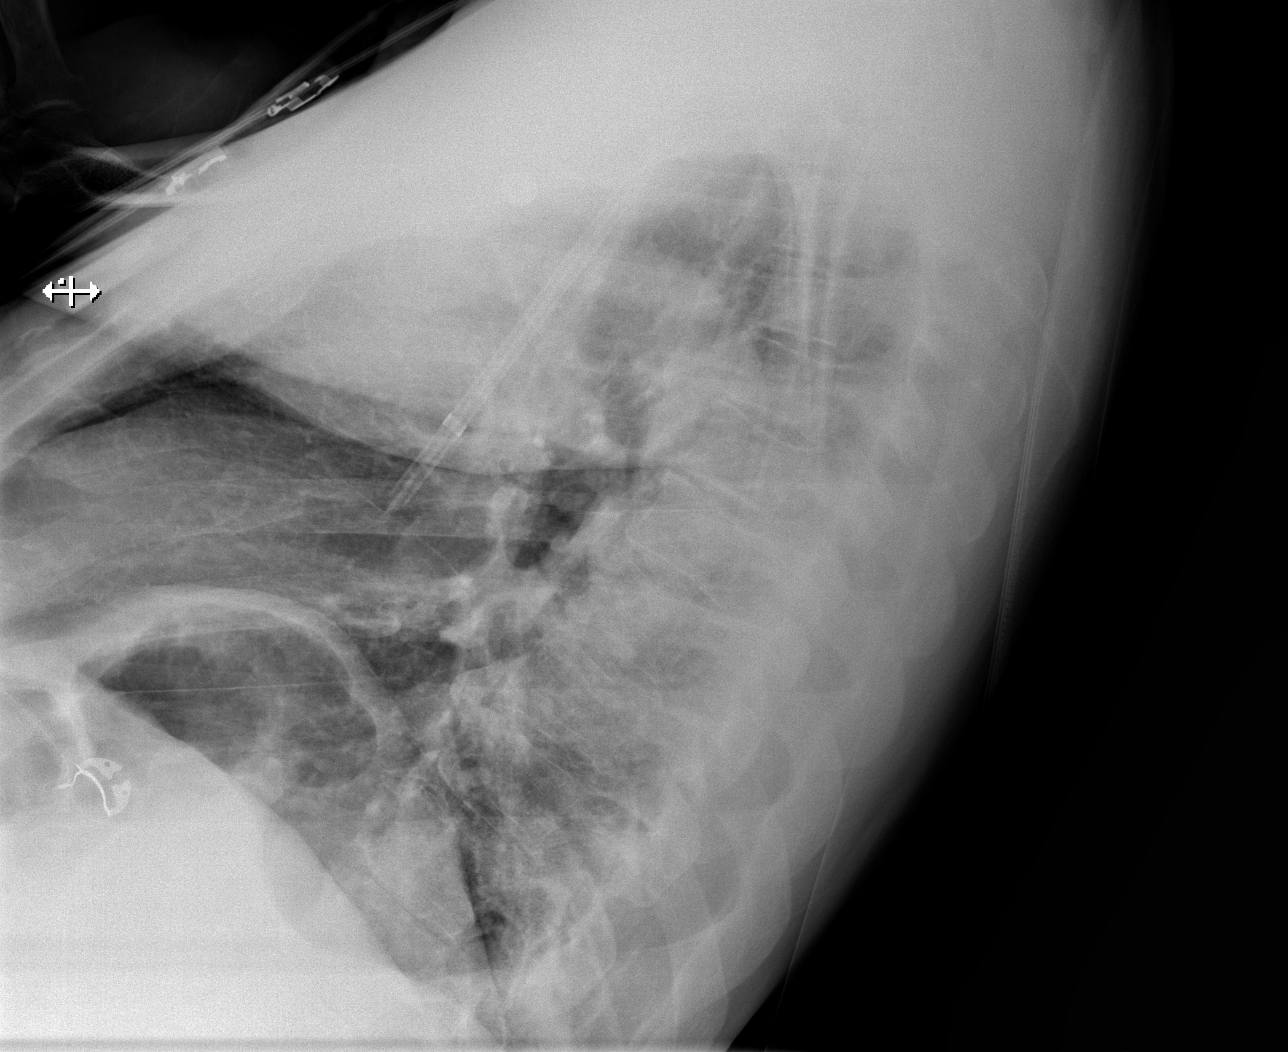

[w chest lat (2 of 2)]
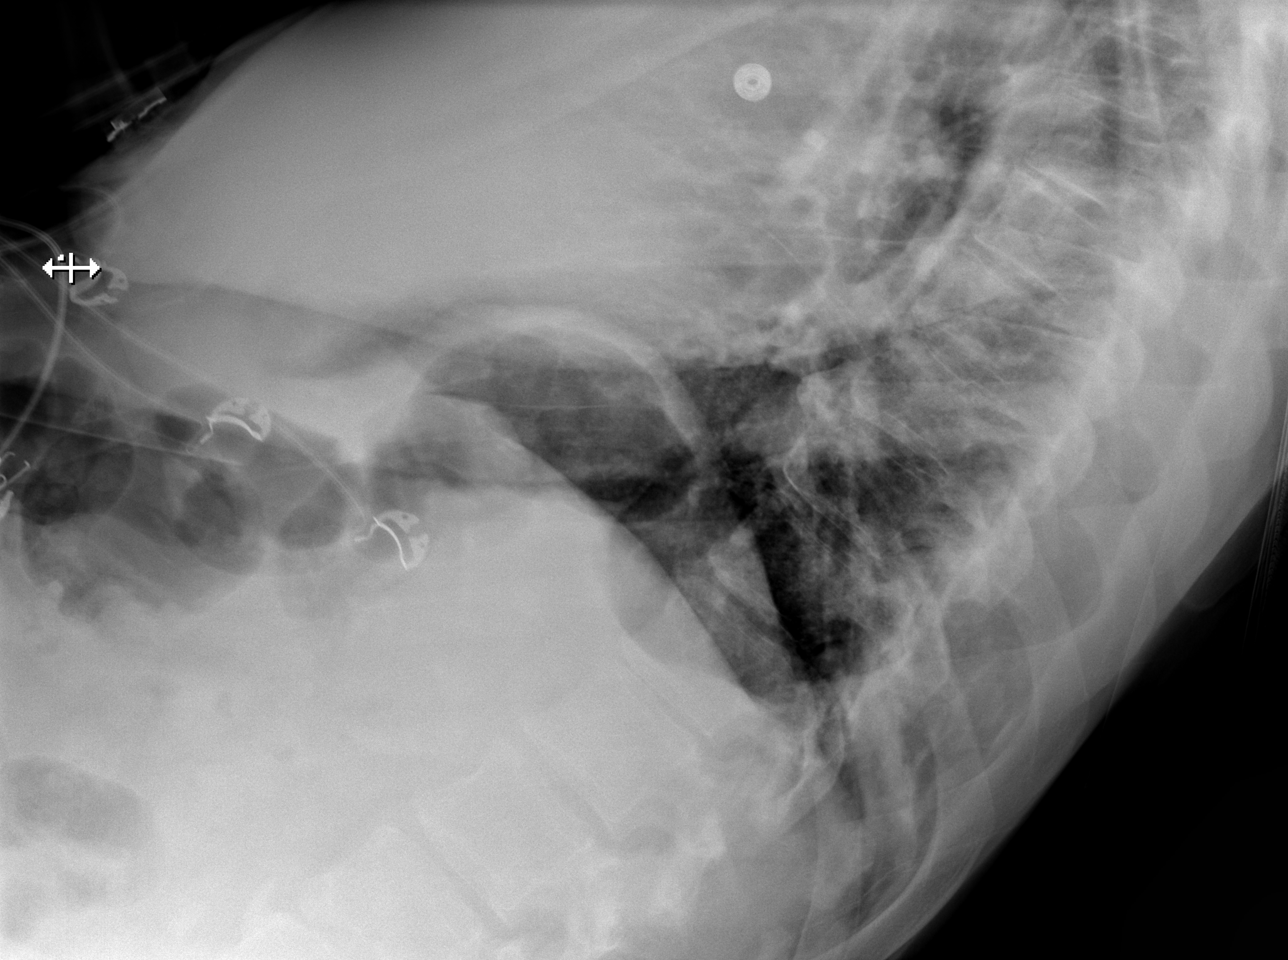

[x chest ap (1 of 2)]
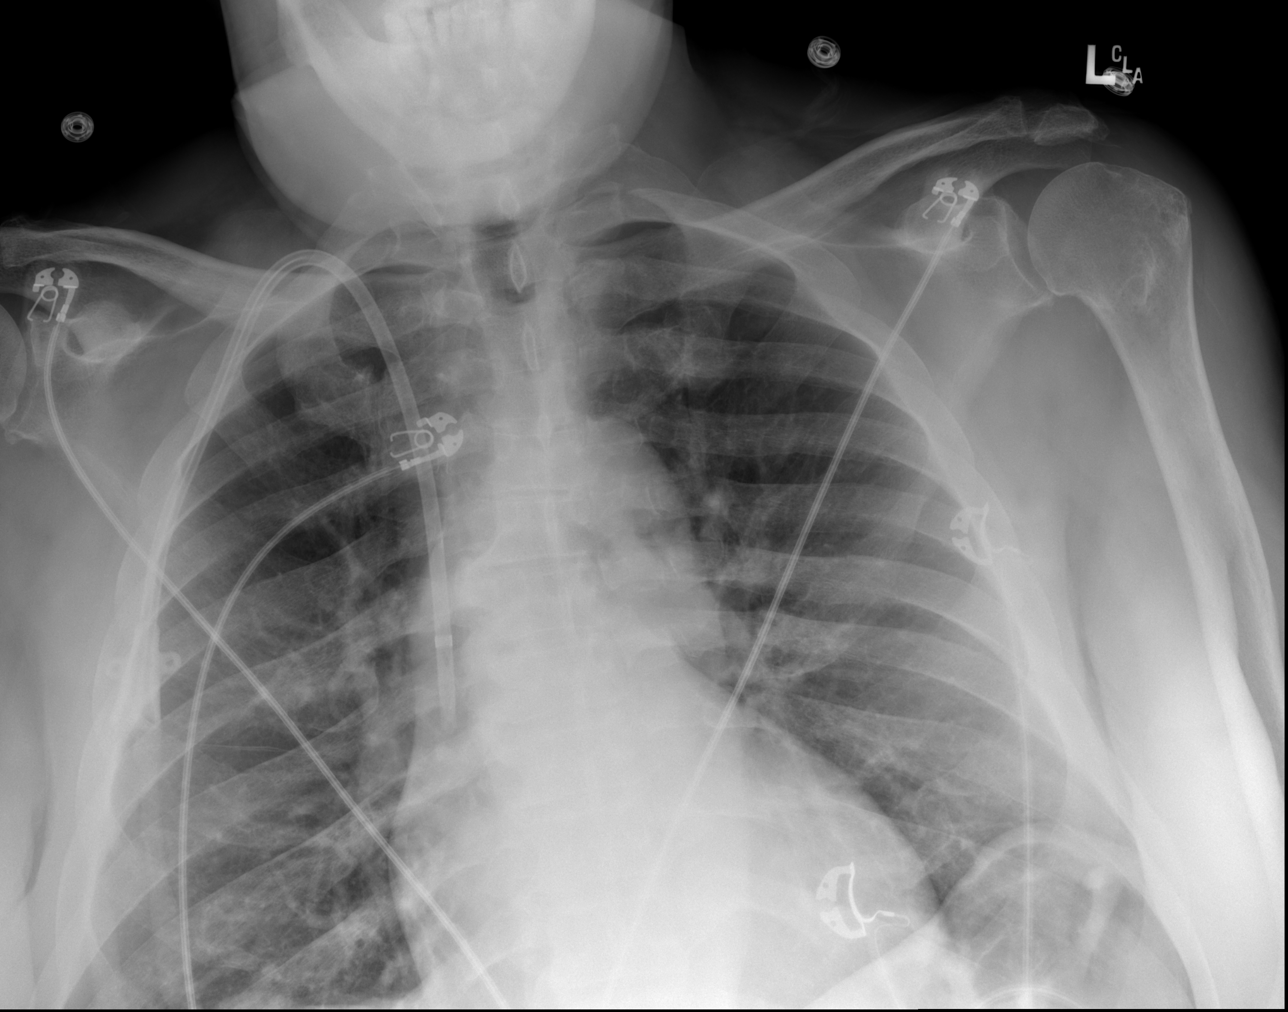

[x chest ap (2 of 2)]
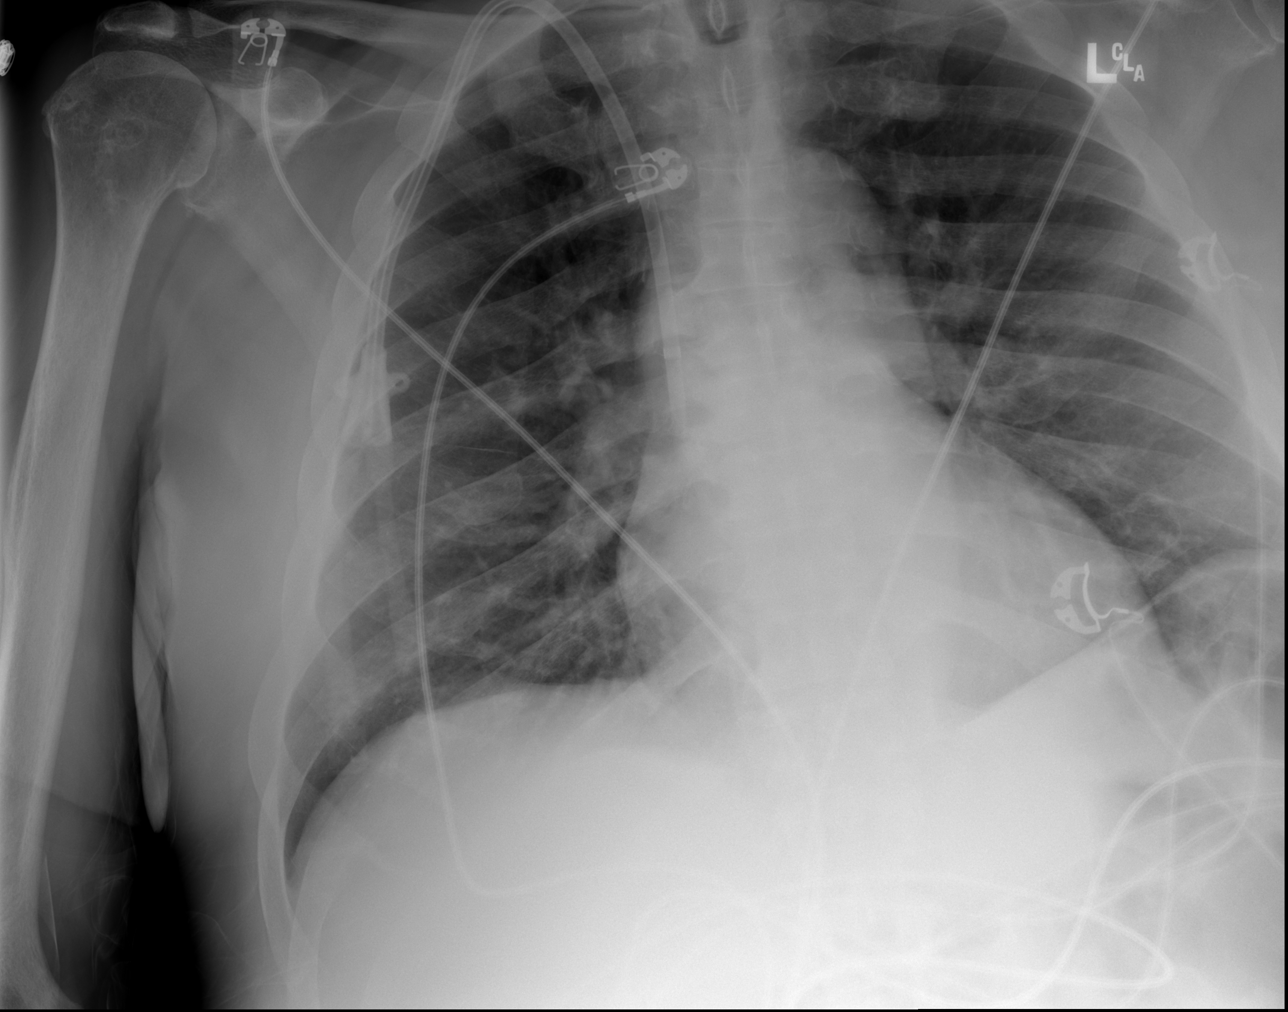

[4 of 4 positions shown; findings below may reference images not displayed]

FINDINGS: Tunneled dual lumen dialysis catheter tip terminates at the superior
cavoatrial junction, similar position to prior. Cardiomediastinal
contours are unremarkable. Telemetry leads overlie the chest.

Some hazy interstitial opacities are present throughout the lungs
with central vascular congestion and cephalization. No visible
pneumothorax or effusion. No acute osseous or soft tissue
abnormality.
IMPRESSION: Hazy opacities likely reflect edema/volume overload given vascular
congestion and redistribution though should correlate with clinical
setting as atypical infection could present similarly.

## 2020-01-04 IMAGING — MR MR CERVICAL SPINE W/O CM
5 series · 37 of 48 positions shown · non-contrast
Comparison: MR head without contrast [DATE].

CLINICAL DATA: Severe diffuse back pain. Study was ordered without
and contrast. None contrast imaging the cervical spine was
completed. Patient refused further imaging due to pain. Study is
moderately degraded by patient motion.

EXAM:
MRI CERVICAL SPINE WITHOUT CONTRAST
TECHNIQUE: Multiplanar, multisequence MR imaging of the cervical spine was
performed. No intravenous contrast was administered.

[Series 5: T1 · sagittal · 3.0mm · 0.69mm/px · 6 of 15 slices shown]
[im 1/15]
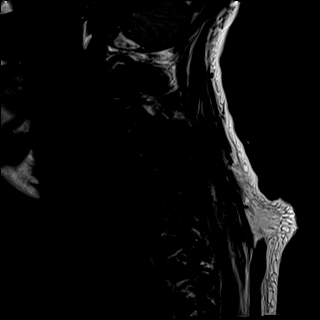
[im 3/15]
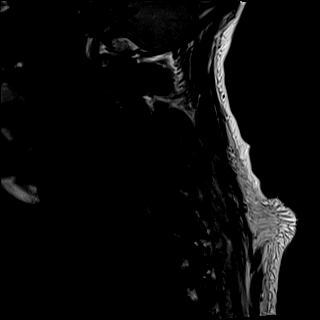
[im 6/15]
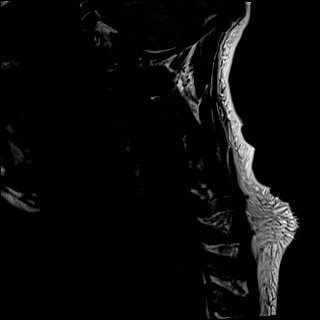
[im 9/15]
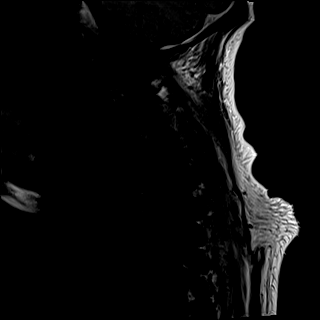
[im 12/15]
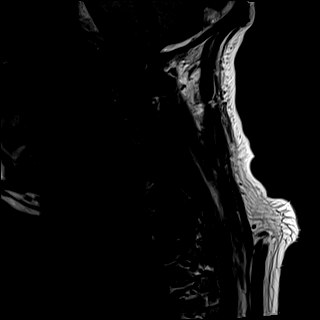
[im 15/15]
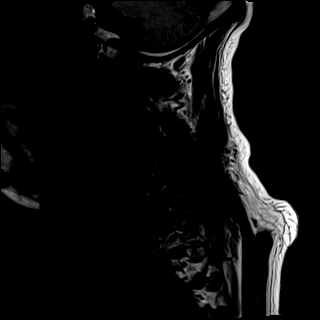

[Series 6: T2 · sagittal · 3.0mm · 0.69mm/px · 7 of 15 slices shown (1 of 2)]
[im 1/15]
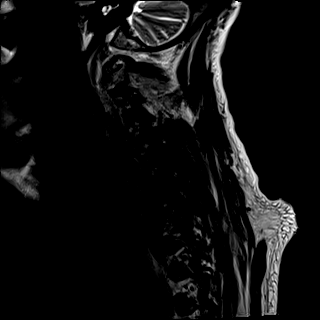
[im 3/15]
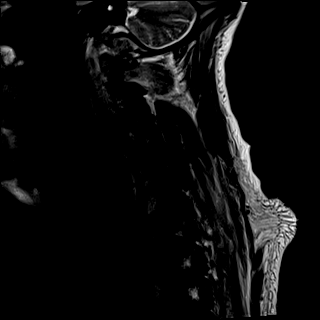
[im 5/15]
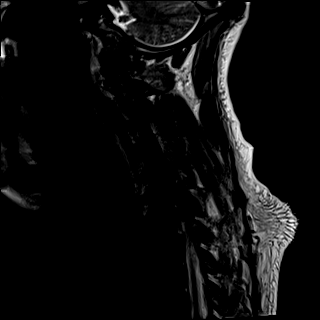
[im 8/15]
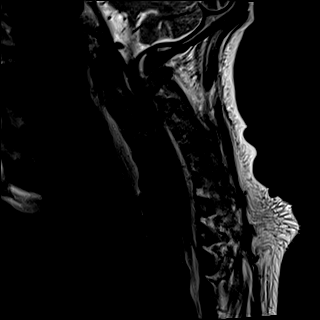
[im 10/15]
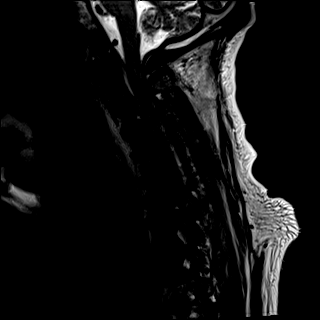
[im 12/15]
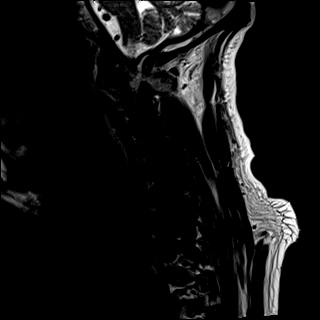
[im 15/15]
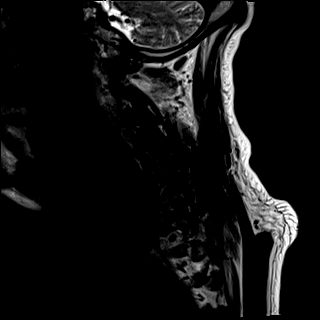

[Series 7: STIR · sagittal · 3.0mm · 0.86mm/px · 7 of 15 slices shown]
[im 1/15]
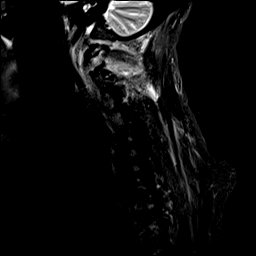
[im 3/15]
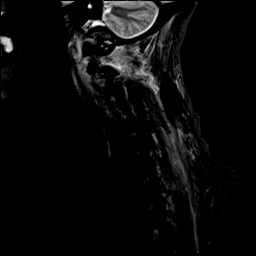
[im 5/15]
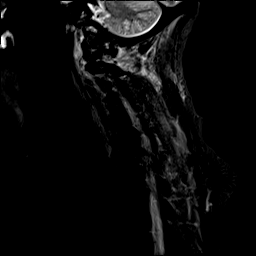
[im 8/15]
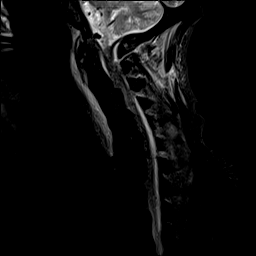
[im 10/15]
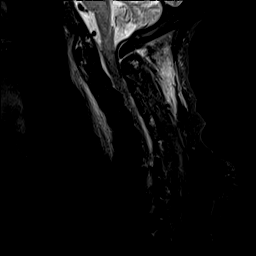
[im 12/15]
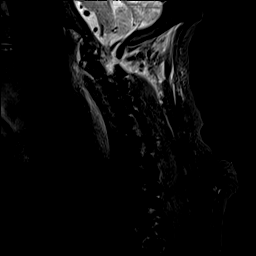
[im 15/15]
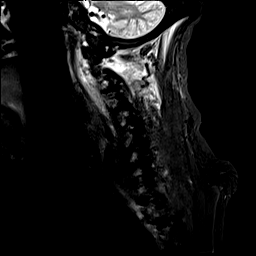

[Series 8: T2 · axial · 3.0mm · 0.70mm/px · z∈[-85,+7]mm · 9 of 30 slices shown (2 of 2)]
[im 1/30]
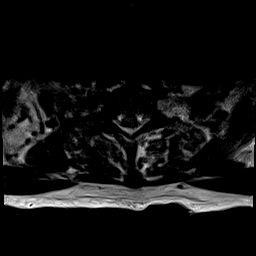
[im 3/30]
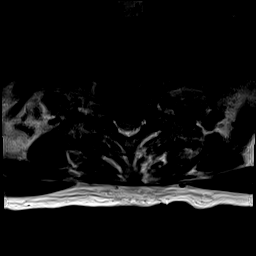
[im 5/30]
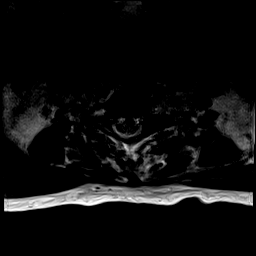
[im 9/30]
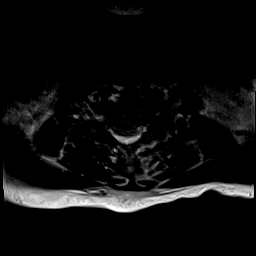
[im 14/30]
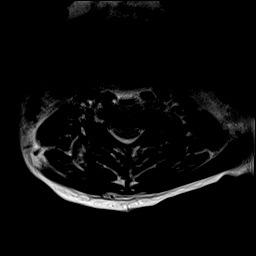
[im 16/30]
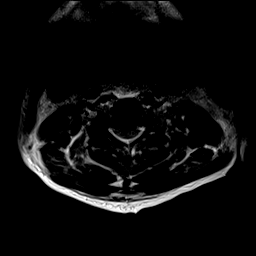
[im 21/30]
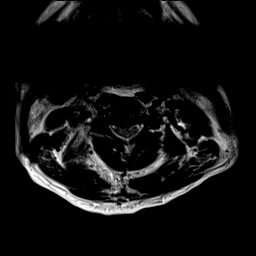
[im 25/30]
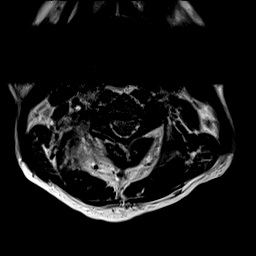
[im 30/30]
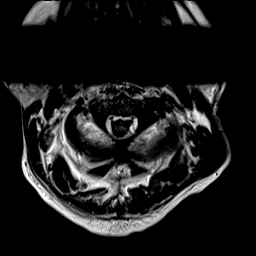

[Series 9: GRE · axial · 3.0mm · 0.35mm/px · z∈[-85,+7]mm · 8 of 30 slices shown]
[im 1/30]
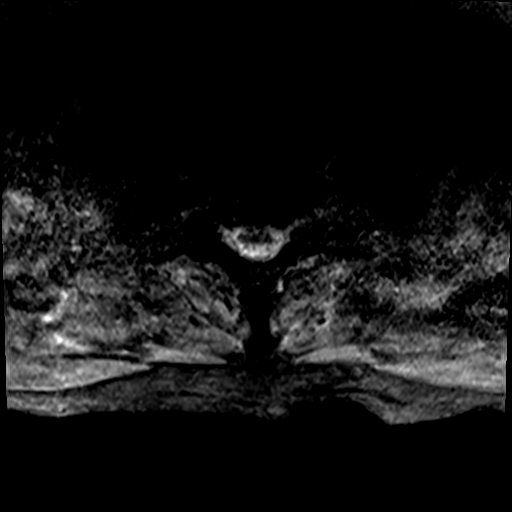
[im 5/30]
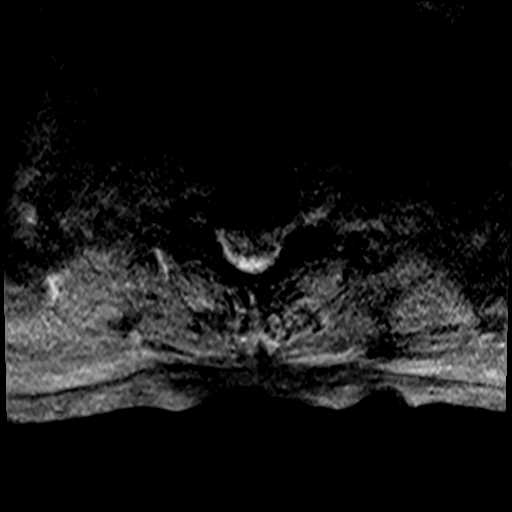
[im 9/30]
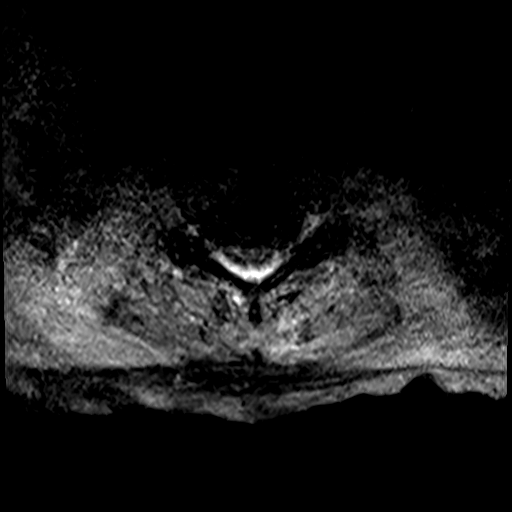
[im 14/30]
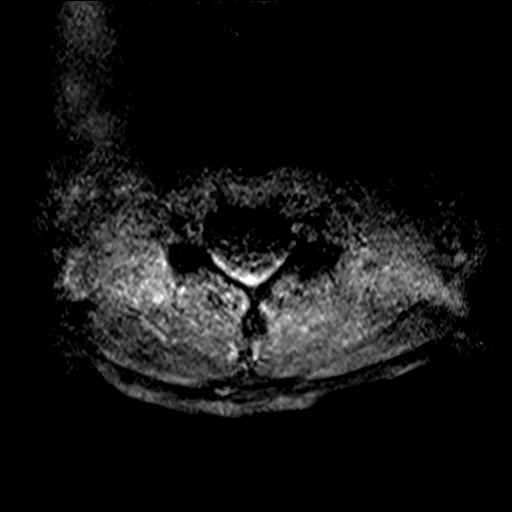
[im 16/30]
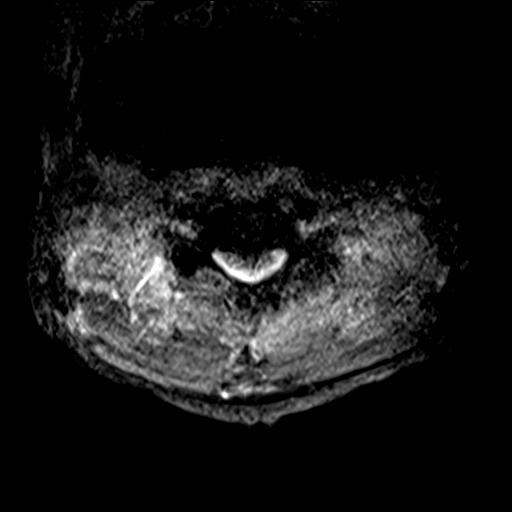
[im 21/30]
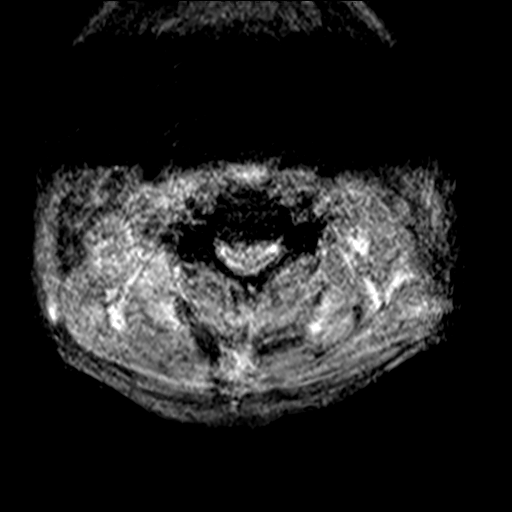
[im 25/30]
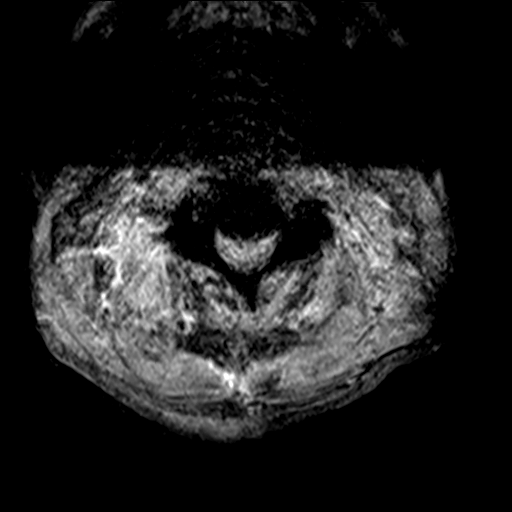
[im 30/30]
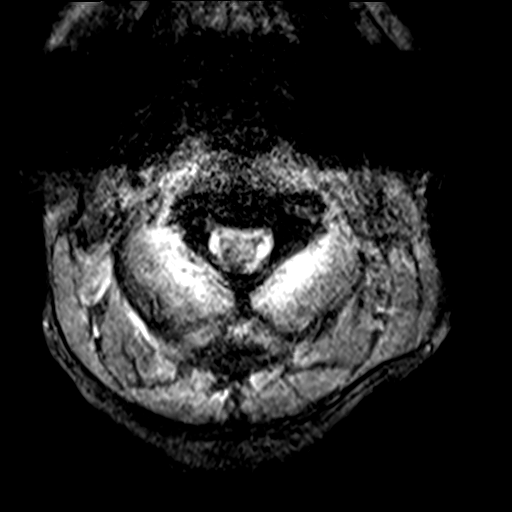

[37 of 48 positions shown; findings below may reference images not displayed]

FINDINGS: Alignment: Slight retrolisthesis is present at C5-6. Degenerative
anterolisthesis is present T1-2. Reversal of the normal cervical
lordosis is noted.

Vertebrae: Decreased T1 marrow signal is present in the lower
cervical spine, particularly C7 and T1. Vertebral body heights are
maintained.

Cord: T2 signal hyperintensity is present in the spinal cord from
C3-4 through C5.

Posterior Fossa, vertebral arteries, paraspinal tissues: Visualized
brainstem and cerebellum are normal. Diffuse edema is present in the
occipital paraspinous musculature bilaterally. No discrete abscess
is present. Extensive prevertebral edema extends from the clivus to
C6. No abscess is present. Mild edema is present in the posterior
paraspinous muscles, left greater than right.

Disc levels:

a abnormal fluid present in the dorsal spinal canal extending from
foramen magnum to the T2 level. Severe disc disease present at C5-6
and C6-7. The cord is compressed at these levels. Mild disc disease
present at C3-4 and C4-5 with some cord compression at these levels.
IMPRESSION: 1. Abnormal fluid in the dorsal spinal canal extending from foramen
magnum to the T2 level. Given the other inflammatory changes and
concern, this most likely represents epidural infection. There is
some fluid ventral to the spinal cord at C2.
2. Diffuse prevertebral edema from the clivus to C6 is also
concerning for infection.
3. The cord is compressed without abnormal signal suggesting edema.
4. Mild edema in the posterior paraspinous muscles, left greater
than right. Edema is most significant within the paraspinous muscles
just below the occiput. No focal abscess is present.
5. Severe disc disease at C5-6 and C6-7 with moderate central canal
stenosis at both levels.
6. Mild disc disease at C3-4 and C4-5 with some cord compression at
these levels.
7. Abnormal marrow signal in the lower cervical spine, particularly
C7 and T1. This is nonspecific and can be seen in the setting of
anemia, smoking, or obesity.

## 2020-01-04 MED ORDER — INSULIN ASPART 100 UNIT/ML ~~LOC~~ SOLN
0.0000 [IU] | SUBCUTANEOUS | Status: DC
  Administered 2020-01-05 – 2020-01-06 (×7): 1 [IU] via SUBCUTANEOUS
  Administered 2020-01-07: 3 [IU] via SUBCUTANEOUS
  Administered 2020-01-07: 1 [IU] via SUBCUTANEOUS
  Administered 2020-01-07: 2 [IU] via SUBCUTANEOUS
  Administered 2020-01-07: 1 [IU] via SUBCUTANEOUS
  Administered 2020-01-08 (×2): 2 [IU] via SUBCUTANEOUS
  Administered 2020-01-09: 1 [IU] via SUBCUTANEOUS
  Administered 2020-01-09: 2 [IU] via SUBCUTANEOUS
  Administered 2020-01-09 – 2020-01-10 (×3): 1 [IU] via SUBCUTANEOUS
  Administered 2020-01-11: 3 [IU] via SUBCUTANEOUS
  Administered 2020-01-12: 4 [IU] via SUBCUTANEOUS
  Filled 2020-01-04: qty 0.06

## 2020-01-04 MED ORDER — HYDROMORPHONE HCL 1 MG/ML IJ SOLN
1.0000 mg | Freq: Once | INTRAMUSCULAR | Status: AC
  Administered 2020-01-04: 1 mg via INTRAVENOUS
  Filled 2020-01-04: qty 1

## 2020-01-04 MED ORDER — ONDANSETRON HCL 4 MG/2ML IJ SOLN
4.0000 mg | Freq: Four times a day (QID) | INTRAMUSCULAR | Status: DC | PRN
  Administered 2020-01-04 – 2020-01-13 (×2): 4 mg via INTRAVENOUS
  Filled 2020-01-04 (×2): qty 2

## 2020-01-04 MED ORDER — ONDANSETRON HCL 4 MG PO TABS: 4.0000 mg | ORAL_TABLET | Freq: Four times a day (QID) | ORAL | Status: DC | PRN

## 2020-01-04 MED ORDER — HYDRALAZINE HCL 20 MG/ML IJ SOLN
10.0000 mg | INTRAMUSCULAR | Status: DC | PRN
  Administered 2020-01-05 – 2020-01-10 (×3): 10 mg via INTRAVENOUS
  Filled 2020-01-04 (×5): qty 1

## 2020-01-04 NOTE — ED Notes (Signed)
Pt aware urine sample needed. Pt self removed Condom Cath. Pt provided with urinal.

## 2020-01-04 NOTE — ED Notes (Signed)
PT removed condom catheter per MRI, due to PT expressed he needed to urinate, PT was advised he can urinate with the condom catheter in place.

## 2020-01-04 NOTE — ED Triage Notes (Addendum)
BIB by [facility], Seen on Friday at [facility] for headache and pain, Pain and headache continues and is located in back and all over. Pt is on dialysis MWF at [facility]. He elected to come here rather than [facility]. [id number] CBG 249.  Pt endorsed to EMS he used Crack Cocaine last night.

## 2020-01-04 NOTE — ED Notes (Signed)
Per Charge Pt unable to tolerate MRI. Pain medications ordered and pt willl be taken back to MRI.

## 2020-01-04 NOTE — ED Notes (Signed)
Per [facility] Hope leave MRI orders as is. Pt went to MRI X2 unable to tolerate all MRI's after pain medication admin. Md aware of situation

## 2020-01-04 NOTE — ED Notes (Signed)
[phone] good friend Venia Carbon request for call from RN once PT is place in rm

## 2020-01-04 NOTE — ED Notes (Signed)
Pt aware urine sample needed 

## 2020-01-04 NOTE — Telephone Encounter (Signed)
In-person visit with patient's daughter Jon Billings is preferred name, family calls her Debroah Baller) -- documenting in Epic so that providers are aware:  Daughter came to his HD center today - hoping to speak to someone. She reports that he called EMS this morning for transport to the hospital - [facility] (his preference).  Was in ED 7/30 with generalized pain and sharp intermittent headache, Dx with trigeminal neuralgia. MRI without acute issue noted. Rx for Tegretol sent to [facility], daughter has not been able to pick up yet. He refused dialysis on Friday.  Presented for OP HD on 8/2 -- got partial treatment (1hr 80min). Was noted to have eyes moving side to side, he reported loss of taste, and generalized confusion. He was 1.5kg below his dry weight. He was sent to ED - but left prior to being seen. Did have triage labs drawn, WBC ^ at 25.8.  Daughter said this morning, he was very confused and in pain. Admitted to using cocaine last night. She states that he has mentioned stopping HD on multiple occasions, she admits that he never really wanted to start it, but that the family pressured him into it. He is not compliant with his HD treatments, looks like usually only stays about 1.5 - 3 hr of 4:15hr prescribed HD.  Will d/w SW at hospital and HD unit. Sounds like needs treatment of his acute issue - whether it be trigeminal neuralgia, or other etiology -> he has a catheter and leukocytosis + loss of taste, consider bacteremia, COVID, or other infection.   THEN, he will need to decide whether he wants to continue HD or now. If not, will assist with hospice referral. If so, then the family reports that they need help caring for him - would like assistance in getting him to SNF.  Veneta Penton, PA-C [facility] Pager [phone]   Daughter would like an update - Her name is Miron Marxen, Phone # [phone].

## 2020-01-04 NOTE — ED Notes (Signed)
Pt transported to MRI 

## 2020-01-04 NOTE — H&P (Signed)
History and Physical    [name]. YSA:[id number] DOB: 1950-12-27 DOA: 01/04/2020  PCP: [facility]  Patient coming from: Home.  Chief Complaint: Back pain confusion headache.  History obtained from patient's daughter as patient is confused.  HPI: [name]. is a 69 y.o. male with history of ESRD on hemodialysis on Monday Wednesday Friday, hypertension, diabetes mellitus type 2, anemia history of colon cancer status post resection has been having increasing headache back pain over the last week or so.  Patient had come to the ER about 7 days ago with headache at that time CT head was unremarkable and was discharged on carbamazepine for possible trigeminal neuralgia.  Since then patient has become more weak and confused but has not taken his medication which was prescribed.  Patient also was unable to get out of the tub on Saturday that is about 3 days ago.  Since patient has been worsening pain patient was brought to the ER again.  Pain is mostly in the head and upper back and neck.  Per patient's daughter patient also was finding it difficult to ambulate because of the weakness.  ED Course: In the ER patient appears weak in all extremities labs are significant for WBC count of 25.3 blood glucose of 233 creatinine 15.45 potassium 4.4 lactic acid 1.7 hemoglobin 10 Covid test negative.  Chest x-ray shows possible fluid overload.  MRI of the C-spine shows features concerning for epidural abscess.  MRI of the T-spine L-spine were attempted but was unable to be done neurosurgery was consulted.  At this time neurosurgery is planning to do procedure possible decompression versus IR consult.  Patient has not been started on antibiotic until the procedure is done.  On exam patient appears confused and is weak on all extremities.  Review of Systems: As per HPI, rest all negative.   Past Medical History:  Diagnosis Date  . Anemia   . Arthritis    left knee  . Chronic kidney  disease    acute renal failure/injury requiring short-term HD 2013  . Colon cancer ([place])   . Colon polyp    11/2011 - Polyps identified, biopsy - invasive adenocarinoma  . DM II (diabetes mellitus, type II), controlled ([place])    type 2 IDDM x 15 years. A1C 1/09 13.7.   Marland Kitchen 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 12/25/2011  . GERD (gastroesophageal reflux disease)   . Heart murmur   . 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 Hyperlipidemia   . Hypertension    for a few years and resolved in 2013/2014.  . Sleep apnea    does not wear CPAP now  . Wears glasses     Past Surgical History:  Procedure Laterality Date  . APPLICATION OF WOUND VAC  02/16/2012   Procedure: APPLICATION OF WOUND VAC;  Surgeon: Zenovia Jarred, MD;  Location: [place];  Service: General;  Laterality: N/A;  . APPLICATION OF WOUND VAC  02/26/2012   Procedure: APPLICATION OF WOUND VAC;  Surgeon: Imogene Burn. [name], MD;  Location: [place];  Service: General;  Laterality: N/A;  . AV FISTULA PLACEMENT Left 07/29/2019   Procedure: ARTERIOVENOUS (AV) FISTULA CREATION;  Surgeon: Marty Heck, MD;  Location: [place];  Service: Vascular;  Laterality: Left;  . COLONOSCOPY    . COLONOSCOPY  02/06/2012   Procedure: COLONOSCOPY;  Surgeon: Milus Banister, MD;  Location: [place];  Service: Endoscopy;;  . COLONOSCOPY WITH PROPOFOL N/A 06/10/2019   Procedure:  COLONOSCOPY WITH PROPOFOL;  Surgeon: Milus Banister, MD;  Location: [facility];  Service: Endoscopy;  Laterality: N/A;  . COLOSTOMY REVERSAL    . DRESSING CHANGE UNDER ANESTHESIA  02/18/2012   Procedure: DRESSING CHANGE UNDER ANESTHESIA;  Surgeon: Odis Hollingshead, MD;  Location: [place];  Service: General;  Laterality: N/A;  . ESOPHAGOGASTRODUODENOSCOPY (EGD) WITH PROPOFOL N/A 06/10/2019   Procedure: ESOPHAGOGASTRODUODENOSCOPY (EGD) WITH PROPOFOL;  Surgeon: Milus Banister, MD;  Location: [facility];  Service: Endoscopy;  Laterality: N/A;  . GANGLION CYST EXCISION  20 YRS AGO   RT  ARM  . ILEOSTOMY  02/16/2012   Procedure: ILEOSTOMY;  Surgeon: Zenovia Jarred, MD;  Location: [place];  Service: General;  Laterality: N/A;  . ILEOSTOMY CLOSURE N/A 09/02/2012   Procedure: ILEOSTOMY REVERSAL;  Surgeon: Imogene Burn. [name], MD;  Location: [place];  Service: General;  Laterality: N/A;  . ILIOSTOMY    . INCISION AND DRAINAGE OF WOUND  02/23/2012   Procedure: IRRIGATION AND DEBRIDEMENT WOUND;  Surgeon: Joyice Faster. Cornett, MD;  Location: [place];  Service: General;  Laterality: N/A;  . LAPAROTOMY  02/16/2012   Procedure: EXPLORATORY LAPAROTOMY;  Surgeon: Zenovia Jarred, MD;  Location: [facility];  Service: General;  Laterality: N/A;  Exploratory Laparotomy with resection of anastomosis  . LAPAROTOMY  02/18/2012   Procedure: EXPLORATORY LAPAROTOMY;  Surgeon: Odis Hollingshead, MD;  Location: [place];  Service: General;  Laterality: N/A;  exploratory laparotomy  change of abdominal vac dressing  . LAPAROTOMY  02/20/2012   Procedure: EXPLORATORY LAPAROTOMY;  Surgeon: Odis Hollingshead, MD;  Location: [place];  Service: General;  Laterality: N/A;  . LAPAROTOMY  02/23/2012   Procedure: EXPLORATORY LAPAROTOMY;  Surgeon: Joyice Faster. Cornett, MD;  Location: [place];  Service: General;  Laterality: N/A;  Irrigation and Debridement of abdominal wound with wound vac change with partial closure  . LAPAROTOMY  02/26/2012   Procedure: EXPLORATORY LAPAROTOMY;  Surgeon: Imogene Burn. [name], MD;  Location: [place];  Service: General;  Laterality: N/A;     . PARTIAL COLECTOMY  02/06/2012  . PARTIAL COLECTOMY  02/06/2012   Procedure: PARTIAL COLECTOMY;  Surgeon: Imogene Burn. [name], MD;  Location: [place];  Service: General;  Laterality: N/A;  right partial colectomy  . RESECTION SMALL BOWEL / CLOSURE ILEOSTOMY  402/2014   Dr [name]  . VACUUM ASSISTED CLOSURE CHANGE  02/20/2012   Procedure: ABDOMINAL VACUUM ASSISTED CLOSURE CHANGE;  Surgeon: Odis Hollingshead, MD;  Location: [place];  Service: General;;  . VACUUM ASSISTED CLOSURE CHANGE   02/23/2012   Procedure: ABDOMINAL VACUUM ASSISTED CLOSURE CHANGE;  Surgeon: Joyice Faster. Cornett, MD;  Location: [place];  Service: General;  Laterality: N/A;     reports that he has been smoking cigarettes. He has a 15.00 pack-year smoking history. He has never used smokeless tobacco. He reports current alcohol use. He reports current drug use. Drug: Cocaine.  No Known Allergies  Family History  Problem Relation Age of Onset  . Diabetes Father   . Heart disease Father   . Hypertension Mother   . Diabetes Sister   . Heart disease Sister   . Colon cancer Neg Hx     Prior to Admission medications   Medication Sig Start Date End Date Taking? Authorizing Provider  amLODipine (NORVASC) 10 MG tablet Take 10 mg by mouth at bedtime.    Yes [provider]  atorvastatin (LIPITOR) 20 MG tablet Take 20 mg by mouth at bedtime.  Yes [provider]  betamethasone dipropionate 0.05 % cream Apply 1 application topically 2 (two) times daily as needed (skin irritation; psoriasis).   Yes [provider]  insulin detemir (LEVEMIR) 100 UNIT/ML injection Inject 0.24 mLs (24 Units total) into the skin at bedtime. Patient taking differently: Inject 26 Units into the skin See admin instructions. Per sliding scale: Most is 22 units and least is 12 units. 12/08/13  Yes Boggala, Gaynelle Adu, MD  lidocaine-prilocaine (EMLA) cream Apply 1 application topically once. Apply to needle site 1 hr. Prior to cannulation for hemodialysis.   Yes [provider]  metoprolol tartrate (LOPRESSOR) 50 MG tablet Take 50 mg by mouth 2 (two) times daily.    Yes [provider]  multivitamin (RENA-VIT) TABS tablet Take 1 tablet by mouth daily.   Yes [provider]  carbamazepine (TEGRETOL) 200 MG tablet Take 1 tablet (200 mg total) by mouth daily. Patient not taking: Reported on 01/04/2020 12/31/19 01/30/20  Rodell Perna A, PA-C  HYDROcodone-acetaminophen (NORCO/VICODIN) 5-325 MG tablet  Take 1 tablet by mouth every 4 (four) hours as needed for moderate pain. Patient not taking: Reported on 12/31/2019 07/29/19 07/28/20  Barbie Banner, PA-C    Physical Exam: Constitutional: Moderately built and nourished. Vitals:   01/04/20 1833 01/04/20 1949 01/04/20 2038 01/04/20 2119  BP: (!) 212/80  (!) 148/134 (!) 162/85  Pulse: (!) 104 (!) 102  (!) 102  Resp: 18 (!) 21 18 (!) 23  Temp:      TempSrc:      SpO2: 93% 98%  99%   Eyes: Anicteric no pallor. ENMT: No discharge from the ears eyes nose or mouth. Neck: No mass felt.  No neck rigidity. Respiratory: No rhonchi or crepitations. Cardiovascular: S1-S2 heard. Abdomen: Soft nontender bowel sound present. Musculoskeletal: No edema. Skin: No rash. Neurologic: Patient is alert awake at times appears confused oriented to his name.  Patient has about 3 x 5 strength in both upper extremities and about 2 x 5 in both lower extremities.  Deep tendon reflexes are difficult to elicit. Psychiatric: Appears confused.   Labs on Admission: I have personally reviewed following labs and imaging studies  CBC: Recent Labs  Lab 12/30/19 2212 01/03/20 1524 01/04/20 1213  WBC 15.0* 25.8* 25.3*  NEUTROABS  --   --  22.5*  HGB 8.6* 9.5* 10.0*  HCT 25.6* 27.4* 28.6*  MCV 76.9* 74.1* 75.5*  PLT 180 276 528   Basic Metabolic Panel: Recent Labs  Lab 12/30/19 2212 01/03/20 1524 01/04/20 1213  NA 132* 132* 131*  K 3.3* 3.8 4.4  CL 96* 93* 90*  CO2 23 23 23   GLUCOSE 150* 175* 233*  BUN 71* 87* 96*  CREATININE 14.32* 14.34* 15.45*  CALCIUM 8.3* 8.5* 8.7*   GFR: Estimated Creatinine Clearance: 5 mL/min (A) (by C-G formula based on SCr of 15.45 mg/dL (H)). Liver Function Tests: Recent Labs  Lab 01/04/20 1213  AST 21  ALT 20  ALKPHOS 84  BILITOT 1.1  PROT 9.1*  ALBUMIN 3.2*   No results for input(s): LIPASE, AMYLASE in the last 168 hours. No results for input(s): AMMONIA in the last 168 hours. Coagulation Profile: Recent  Labs  Lab 12/30/19 2212 01/04/20 1213  INR 1.3* 1.3*   Cardiac Enzymes: No results for input(s): CKTOTAL, CKMB, CKMBINDEX, TROPONINI in the last 168 hours. BNP (last 3 results) No results for input(s): PROBNP in the last 8760 hours. HbA1C: No results for input(s): HGBA1C in the last 72  hours. CBG: Recent Labs  Lab 01/04/20 1416  GLUCAP 192*   Lipid Profile: No results for input(s): CHOL, HDL, LDLCALC, TRIG, CHOLHDL, LDLDIRECT in the last 72 hours. Thyroid Function Tests: No results for input(s): TSH, T4TOTAL, FREET4, T3FREE, THYROIDAB in the last 72 hours. Anemia Panel: No results for input(s): VITAMINB12, FOLATE, FERRITIN, TIBC, IRON, RETICCTPCT in the last 72 hours. Urine analysis:    Component Value Date/Time   COLORURINE YELLOW 01/04/2020 2120   APPEARANCEUR CLEAR 01/04/2020 2120   LABSPEC 1.012 01/04/2020 2120   PHURINE 5.0 01/04/2020 2120   GLUCOSEU 50 (A) 01/04/2020 2120   GLUCOSEU NEG mg/dL 12/05/2009 1042   HGBUR SMALL (A) 01/04/2020 2120   BILIRUBINUR NEGATIVE 01/04/2020 2120   KETONESUR NEGATIVE 01/04/2020 2120   PROTEINUR >=300 (A) 01/04/2020 2120   UROBILINOGEN 0.2 09/15/2013 1059   NITRITE NEGATIVE 01/04/2020 2120   LEUKOCYTESUR NEGATIVE 01/04/2020 2120   Sepsis Labs: @LABRCNTIP (procalcitonin:4,lacticidven:4) ) Recent Results (from the past 240 hour(s))  SARS Coronavirus 2 by RT PCR (hospital order, performed in [facility] hospital lab) Nasopharyngeal Nasopharyngeal Swab     Status: None   Collection Time: 01/04/20  2:45 PM   Specimen: Nasopharyngeal Swab  Result Value Ref Range Status   SARS Coronavirus 2 NEGATIVE NEGATIVE Final    Comment: (NOTE) SARS-CoV-2 target nucleic acids are NOT DETECTED.  The SARS-CoV-2 RNA is generally detectable in upper and lower respiratory specimens during the acute phase of infection. The lowest concentration of SARS-CoV-2 viral copies this assay can detect is 250 copies / mL. A negative result does not preclude  SARS-CoV-2 infection and should not be used as the sole basis for treatment or other patient management decisions.  A negative result may occur with improper specimen collection / handling, submission of specimen other than nasopharyngeal swab, presence of viral mutation(s) within the areas targeted by this assay, and inadequate number of viral copies (<250 copies / mL). A negative result must be combined with clinical observations, patient history, and epidemiological information.  Fact Sheet for Patients:   StrictlyIdeas.no  Fact Sheet for Healthcare Providers: BankingDealers.co.za  This test is not yet approved or  cleared by the [place] FDA and has been authorized for detection and/or diagnosis of SARS-CoV-2 by FDA under an Emergency Use Authorization (EUA).  This EUA will remain in effect (meaning this test can be used) for the duration of the COVID-19 declaration under Section 564(b)(1) of the Act, 21 U.S.C. section 360bbb-3(b)(1), unless the authorization is terminated or revoked sooner.  Performed at [facility], [place] [address] [place], [place] [address]      Radiological Exams on Admission: DG Chest 2 View  Result Date: 01/04/2020 CLINICAL DATA:  Back pain, weakness and shortness of breath EXAM: CHEST - 2 VIEW COMPARISON:  Radiograph 12/30/2019 FINDINGS: Tunneled dual lumen dialysis catheter tip terminates at the superior cavoatrial junction, similar position to prior. Cardiomediastinal contours are unremarkable. Telemetry leads overlie the chest. Some hazy interstitial opacities are present throughout the lungs with central vascular congestion and cephalization. No visible pneumothorax or effusion. No acute osseous or soft tissue abnormality. IMPRESSION: Hazy opacities likely reflect edema/volume overload given vascular congestion and redistribution though should correlate with clinical setting as  atypical infection could present similarly. Electronically Signed   By: Lovena Le M.D.   On: 01/04/2020 15:02   MR CERVICAL SPINE WO CONTRAST  Result Date: 01/04/2020 CLINICAL DATA:  Severe diffuse back pain. Study was ordered without and contrast. None contrast imaging the  cervical spine was completed. Patient refused further imaging due to pain. Study is moderately degraded by patient motion. EXAM: MRI CERVICAL SPINE WITHOUT CONTRAST TECHNIQUE: Multiplanar, multisequence MR imaging of the cervical spine was performed. No intravenous contrast was administered. COMPARISON:  MR head without contrast 12/31/2019. FINDINGS: Alignment: Slight retrolisthesis is present at C5-6. Degenerative anterolisthesis is present T1-2. Reversal of the normal cervical lordosis is noted. Vertebrae: Decreased T1 marrow signal is present in the lower cervical spine, particularly C7 and T1. Vertebral body heights are maintained. Cord: T2 signal hyperintensity is present in the spinal cord from C3-4 through C5. Posterior Fossa, vertebral arteries, paraspinal tissues: Visualized brainstem and cerebellum are normal. Diffuse edema is present in the occipital paraspinous musculature bilaterally. No discrete abscess is present. Extensive prevertebral edema extends from the clivus to C6. No abscess is present. Mild edema is present in the posterior paraspinous muscles, left greater than right. Disc levels: a abnormal fluid present in the dorsal spinal canal extending from foramen magnum to the T2 level. Severe disc disease present at C5-6 and C6-7. The cord is compressed at these levels. Mild disc disease present at C3-4 and C4-5 with some cord compression at these levels. IMPRESSION: 1. Abnormal fluid in the dorsal spinal canal extending from foramen magnum to the T2 level. Given the other inflammatory changes and concern, this most likely represents epidural infection. There is some fluid ventral to the spinal cord at C2. 2. Diffuse  prevertebral edema from the clivus to C6 is also concerning for infection. 3. The cord is compressed without abnormal signal suggesting edema. 4. Mild edema in the posterior paraspinous muscles, left greater than right. Edema is most significant within the paraspinous muscles just below the occiput. No focal abscess is present. 5. Severe disc disease at C5-6 and C6-7 with moderate central canal stenosis at both levels. 6. Mild disc disease at C3-4 and C4-5 with some cord compression at these levels. 7. Abnormal marrow signal in the lower cervical spine, particularly C7 and T1. This is nonspecific and can be seen in the setting of anemia, smoking, or obesity. Electronically Signed   By: San Morelle M.D.   On: 01/04/2020 18:51    EKG: Independently reviewed.  Normal sinus rhythm.  Assessment/Plan Principal Problem:   Epidural abscess Active Problems:   OBSTRUCTIVE SLEEP APNEA   Colon cancer, status Post resection    ESRD (end stage renal disease) (HCC)   Acute encephalopathy    1. Epidural abscess -appreciate neurosurgery consult.  We will keep patient n.p.o. none.  Pain relief medication as tolerated.  Antibiotics has not been started as recommended by neurosurgery since will await cultures after the procedure. 2. All 4 extremities are weak on exam.  Likely from epidural abscess.  MRI of the T and L-spine were not initially able to be done will try to attempt again. 3. ESRD on hemodialysis on Monday Wednesday Friday.  Will need to consult nephrology for dialysis.  Does not appear to be in fluid overload at this time. 4. Acute encephalopathy likely from uremia and infection. 5. Diabetes mellitus type 2 we will keep patient on sliding scale coverage for now. 6. Hypertension we will keep patient on as needed IV hydralazine. 7. Anemia likely from renal disease follow CBC. 8. History of colon cancer status post resection.  Since patient has epidural abscess will need close monitoring for  any further worsening and inpatient status.   DVT prophylaxis: SCDs for now in anticipation of procedure will avoid anticoagulation. Code  Status: Full code. Family Communication: Patient's daughter. Disposition Plan: To be determined. Consults called: Neurosurgery. Admission status: Inpatient.   Rise Patience MD [facility] Pager [phone].  If 7PM-7AM, please contact night-coverage [email or web address] Password [email or web address]  01/04/2020, 10:13 PM

## 2020-01-04 NOTE — ED Provider Notes (Addendum)
[facility] DEPT Provider Note   CSN: [id number] Arrival date & time: 01/04/20  1017     History Chief Complaint  Patient presents with  . Back Pain  . Headache  . Generalized pain    [name]. is a 69 y.o. male.  HPI 69 year old male presents with headache as well as feeling diffuse body pain.  Originally started about four or 5 days ago.  No vomiting, fever or neck stiffness.  He states he has limited range of motion of his neck because it causes his head to hurt.  The head originally started hurting when he woke up that morning many days ago.  It has progressively worsened.  It is diffuse.  He feels weak in his left upper extremity.  He is also having some left thoracic back pain.  Some cough but no real shortness of breath.  No chest pain.  No leg weakness.  Has tried Tylenol with no relief.  He does endorse using cocaine yesterday though denies any other illicit drug abuse.   Past Medical History:  Diagnosis Date  . Anemia   . Arthritis    left knee  . Chronic kidney disease    acute renal failure/injury requiring short-term HD 2013  . Colon cancer ([place])   . Colon polyp    11/2011 - Polyps identified, biopsy - invasive adenocarinoma  . DM II (diabetes mellitus, type II), controlled ([place])    type 2 IDDM x 15 years. A1C 1/09 13.7.   Marland Kitchen 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 12/25/2011  . GERD (gastroesophageal reflux disease)   . Heart murmur   . 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 Hyperlipidemia   . Hypertension    for a few years and resolved in 2013/2014.  . Sleep apnea    does not wear CPAP now  . Wears glasses     Patient Active Problem List   Diagnosis Date Noted  . Symptomatic anemia 02/11/2019  . Type 2 diabetes mellitus with renal complication ([place]) 51/07/5850  . ESRD (end stage renal disease) ([place])   . Cardiac murmur 01/08/2019  . Preoperative clearance 01/08/2019  . Uncontrolled Type 2 DM 09/15/2013  .  HYPERTENSION 03/08/2013  . CKD  06/24/2012  . Colon cancer, status Post resection  05/11/2012  . Health care maintenance 11/27/2011  . Obesity (BMI 30.0-34.9) 10/20/2011  . Cataract, bilateral 08/29/2011  . Hyperlipidemia LDL goal <70 04/14/2006  . MICROCYTIC ANEMIA 04/14/2006  . TOBACCO ABUSE 04/14/2006  . OBSTRUCTIVE SLEEP APNEA 04/14/2006    Past Surgical History:  Procedure Laterality Date  . APPLICATION OF WOUND VAC  02/16/2012   Procedure: APPLICATION OF WOUND VAC;  Surgeon: Zenovia Jarred, MD;  Location: [facility];  Service: General;  Laterality: N/A;  . APPLICATION OF WOUND VAC  02/26/2012   Procedure: APPLICATION OF WOUND VAC;  Surgeon: Imogene Burn. [name], MD;  Location: [facility];  Service: General;  Laterality: N/A;  . AV FISTULA PLACEMENT Left 07/29/2019   Procedure: ARTERIOVENOUS (AV) FISTULA CREATION;  Surgeon: Marty Heck, MD;  Location: [facility];  Service: Vascular;  Laterality: Left;  . COLONOSCOPY    . COLONOSCOPY  02/06/2012   Procedure: COLONOSCOPY;  Surgeon: Milus Banister, MD;  Location: [facility];  Service: Endoscopy;;  . COLONOSCOPY WITH PROPOFOL N/A 06/10/2019   Procedure: COLONOSCOPY WITH PROPOFOL;  Surgeon: Milus Banister, MD;  Location: [facility];  Service: Endoscopy;  Laterality: N/A;  . COLOSTOMY REVERSAL    .  DRESSING CHANGE UNDER ANESTHESIA  02/18/2012   Procedure: DRESSING CHANGE UNDER ANESTHESIA;  Surgeon: Odis Hollingshead, MD;  Location: [facility];  Service: General;  Laterality: N/A;  . ESOPHAGOGASTRODUODENOSCOPY (EGD) WITH PROPOFOL N/A 06/10/2019   Procedure: ESOPHAGOGASTRODUODENOSCOPY (EGD) WITH PROPOFOL;  Surgeon: Milus Banister, MD;  [facility];  Service: Endoscopy;  Laterality: N/A;  . GANGLION CYST EXCISION  20 YRS AGO   RT ARM  . ILEOSTOMY  02/16/2012   Procedure: ILEOSTOMY;  Surgeon: Zenovia Jarred, MD;  Location: [facility];  Service: General;  Laterality: N/A;  . ILEOSTOMY CLOSURE N/A 09/02/2012   Procedure: ILEOSTOMY REVERSAL;  Surgeon:  Imogene Burn. [name], MD;  Location: [facility];  Service: General;  Laterality: N/A;  . ILIOSTOMY    . INCISION AND DRAINAGE OF WOUND  02/23/2012   Procedure: IRRIGATION AND DEBRIDEMENT WOUND;  Surgeon: Joyice Faster. Cornett, MD;  Location: [facility];  Service: General;  Laterality: N/A;  . LAPAROTOMY  02/16/2012   Procedure: EXPLORATORY LAPAROTOMY;  Surgeon: Zenovia Jarred, MD;  Location: [facility];  Service: General;  Laterality: N/A;  Exploratory Laparotomy with resection of anastomosis  . LAPAROTOMY  02/18/2012   Procedure: EXPLORATORY LAPAROTOMY;  Surgeon: Odis Hollingshead, MD;  Location: [facility];  Service: General;  Laterality: N/A;  exploratory laparotomy  change of abdominal vac dressing  . LAPAROTOMY  02/20/2012   Procedure: EXPLORATORY LAPAROTOMY;  Surgeon: Odis Hollingshead, MD;  Location: [facility];  Service: General;  Laterality: N/A;  . LAPAROTOMY  02/23/2012   Procedure: EXPLORATORY LAPAROTOMY;  Surgeon: Joyice Faster. Cornett, MD;  Location: [facility];  Service: General;  Laterality: N/A;  Irrigation and Debridement of abdominal wound with wound vac change with partial closure  . LAPAROTOMY  02/26/2012   Procedure: EXPLORATORY LAPAROTOMY;  Surgeon: Imogene Burn. [name], MD;  Location: [facility];  Service: General;  Laterality: N/A;     . PARTIAL COLECTOMY  02/06/2012  . PARTIAL COLECTOMY  02/06/2012   Procedure: PARTIAL COLECTOMY;  Surgeon: Imogene Burn. [name], MD;  Location: [facility];  Service: General;  Laterality: N/A;  right partial colectomy  . RESECTION SMALL BOWEL / CLOSURE ILEOSTOMY  402/2014   Dr [name]  . VACUUM ASSISTED CLOSURE CHANGE  02/20/2012   Procedure: ABDOMINAL VACUUM ASSISTED CLOSURE CHANGE;  Surgeon: Odis Hollingshead, MD;  Location: [facility];  Service: General;;  . VACUUM ASSISTED CLOSURE CHANGE  02/23/2012   Procedure: ABDOMINAL VACUUM ASSISTED CLOSURE CHANGE;  Surgeon: Joyice Faster. Cornett, MD;  Location: [facility];  Service: General;  Laterality: N/A;       Family History  Problem Relation Age of Onset  .  Diabetes Father   . Heart disease Father   . Hypertension Mother   . Diabetes Sister   . Heart disease Sister   . Colon cancer Neg Hx     Social History   Tobacco Use  . Smoking status: Current Every Day Smoker    Packs/day: 0.50    Years: 30.00    Pack years: 15.00    Types: Cigarettes  . Smokeless tobacco: Never Used  Substance Use Topics  . Alcohol use: Yes    Comment: Liquor twice monthly.  . Drug use: Yes    Types: Cocaine    Comment: Crack Cocaine used last night (01/03/20)    Home Medications Prior to Admission medications   Medication Sig Start Date End Date Taking? Authorizing Provider  amLODipine (NORVASC) 10 MG tablet Take 10 mg by mouth at bedtime.  [provider]  atorvastatin (LIPITOR) 20 MG tablet Take 20 mg by mouth at bedtime.     [provider]  betamethasone dipropionate 0.05 % cream Apply 1 application topically 2 (two) times daily as needed (skin irritation; psoriasis).    [provider]  carbamazepine (TEGRETOL) 200 MG tablet Take 1 tablet (200 mg total) by mouth daily. 12/31/19 01/30/20  Rodell Perna A, PA-C  HYDROcodone-acetaminophen (NORCO/VICODIN) 5-325 MG tablet Take 1 tablet by mouth every 4 (four) hours as needed for moderate pain. Patient not taking: Reported on 12/31/2019 07/29/19 07/28/20  Barbie Banner, PA-C  insulin detemir (LEVEMIR) 100 UNIT/ML injection Inject 0.24 mLs (24 Units total) into the skin at bedtime. Patient taking differently: Inject 26 Units into the skin See admin instructions. Per sliding scale: Most is 22 units and least is 12 units. 12/08/13   Malena Catholic, MD  lidocaine-prilocaine (EMLA) cream Apply 1 application topically once. Apply to needle site 1 hr. Prior to cannulation for hemodialysis.    [provider]  metoprolol tartrate (LOPRESSOR) 50 MG tablet Take 50 mg by mouth 2 (two) times daily.     [provider]  multivitamin (RENA-VIT) TABS tablet Take 1 tablet by  mouth daily.    [provider]    Allergies    Patient has no known allergies.  Review of Systems   Review of Systems  Constitutional: Negative for fever.  Respiratory: Positive for cough. Negative for shortness of breath.   Cardiovascular: Negative for chest pain.  Gastrointestinal: Negative for abdominal pain and vomiting.  Musculoskeletal: Positive for back pain.  Neurological: Positive for weakness and headaches.  All other systems reviewed and are negative.   Physical Exam Updated Vital Signs BP (!) 188/83   Pulse 100   Temp 97.8 F (36.6 C) (Oral)   Resp 20   SpO2 99%   Physical Exam Vitals and nursing note reviewed.  Constitutional:      Appearance: He is well-developed. He is not ill-appearing or diaphoretic.  HENT:     Head: Normocephalic and atraumatic.     Right Ear: External ear normal.     Left Ear: External ear normal.     Nose: Nose normal.  Eyes:     General:        Right eye: No discharge.        Left eye: No discharge.  Cardiovascular:     Rate and Rhythm: Normal rate and regular rhythm.     Heart sounds: Normal heart sounds.  Pulmonary:     Effort: Pulmonary effort is normal.     Breath sounds: Normal breath sounds.  Abdominal:     Palpations: Abdomen is soft.     Tenderness: There is no abdominal tenderness.  Musculoskeletal:     Cervical back: Neck supple. No tenderness.     Thoracic back: No tenderness.     Lumbar back: Tenderness present.  Skin:    General: Skin is warm and dry.  Neurological:     Mental Status: He is alert and oriented to person, place, and time.     Comments: CN 3-12 grossly intact. 5/5 strength in right upper, right lower, left lower extremities. Unable to lift his left arm off stretcher more than halfway due to weakness. normal grip strength. Grossly normal sensation. Normal finger to nose on the right  Psychiatric:        Mood and Affect: Mood is not anxious.     ED Results /  Procedures / Treatments     Labs (all labs ordered are listed, but only abnormal results are displayed) Labs Reviewed  COMPREHENSIVE METABOLIC PANEL - Abnormal; Notable for the following components:      Result Value   Sodium 131 (*)    Chloride 90 (*)    Glucose, Bld 233 (*)    BUN 96 (*)    Creatinine, Ser 15.45 (*)    Calcium 8.7 (*)    Total Protein 9.1 (*)    Albumin 3.2 (*)    GFR calc non Af Amer 3 (*)    GFR calc Af Amer 3 (*)    Anion gap 18 (*)    All other components within normal limits  CBC WITH DIFFERENTIAL/PLATELET - Abnormal; Notable for the following components:   WBC 25.3 (*)    RBC 3.79 (*)    Hemoglobin 10.0 (*)    HCT 28.6 (*)    MCV 75.5 (*)    RDW 17.1 (*)    Neutro Abs 22.5 (*)    Monocytes Absolute 1.5 (*)    Abs Immature Granulocytes 0.28 (*)    All other components within normal limits  CBG MONITORING, ED - Abnormal; Notable for the following components:   Glucose-Capillary 192 (*)    All other components within normal limits  SARS CORONAVIRUS 2 BY RT PCR (HOSPITAL ORDER, [facility])  CULTURE, BLOOD (ROUTINE X 2)  CULTURE, BLOOD (ROUTINE X 2)  URINE CULTURE  LACTIC ACID, PLASMA  LACTIC ACID, PLASMA  URINALYSIS, ROUTINE W REFLEX MICROSCOPIC    EKG None  Radiology DG Chest 2 View  Result Date: 01/04/2020 CLINICAL DATA:  Back pain, weakness and shortness of breath EXAM: CHEST - 2 VIEW COMPARISON:  Radiograph 12/30/2019 FINDINGS: Tunneled dual lumen dialysis catheter tip terminates at the superior cavoatrial junction, similar position to prior. Cardiomediastinal contours are unremarkable. Telemetry leads overlie the chest. Some hazy interstitial opacities are present throughout the lungs with central vascular congestion and cephalization. No visible pneumothorax or effusion. No acute osseous or soft tissue abnormality. IMPRESSION: Hazy opacities likely reflect edema/volume overload given vascular congestion and redistribution though should  correlate with clinical setting as atypical infection could present similarly. Electronically Signed   By: Lovena Le M.D.   On: 01/04/2020 15:02    Procedures Procedures (including critical care time)  Medications Ordered in ED Medications  HYDROmorphone (DILAUDID) injection 1 mg (1 mg Intravenous Given 01/04/20 1414)    ED Course  I have reviewed the triage vital signs and the nursing notes.  Pertinent labs & imaging results that were available during my care of the patient were reviewed by me and considered in my medical decision making (see chart for details).    MDM Rules/Calculators/A&P                          Patient's exam shows some mild left upper extremity weakness.  MRI and CT head results were reviewed from a couple days ago which were negative.  He does not have a fever but given his headache and increasing WBC there is some concern this could be CNS infection.  He is not altered or ill-appearing however.  I discussed possible lumbar puncture with him but he adamantly declines.  He seems to understand the importance of potential findings from a lumbar puncture and still declines.  Given the back symptoms I think it is reasonable to get MRI to make  sure there is not an osteo or abscess. Care transferred to Dr. Roslynn Amble. He is not septic, thus no antibiotics at this time.  Final Clinical Impression(s) / ED Diagnoses Final diagnoses:  None    Rx / DC Orders ED Discharge Orders    None       Sherwood Gambler, MD 01/04/20 1636    Sherwood Gambler, MD 01/04/20 [phone]

## 2020-01-04 NOTE — ED Provider Notes (Signed)
Signout note  69 year old male dialysis, found to have leukocytosis, left upper extremity weakness, MRI C, T, L-spine ordered and pending.  4:30 PM received signout from Dr. Verner Chol pending reassessment, MRIs  7:00 PM received call from radiology, concern for infection and epidural space, will page neurosurgery to discuss further management, will update patient  7:19 PM recheck patient, he is somewhat sleepy and do not feel he can tolerate additional narcotic pain medicine at this time, will discuss approach further imaging with neurosurgery when they call back  Discussed with [facility] - he will review with attending, requests I contact IR. Requests admit medicine at [facility], hold abx until biopsy or surgery. Discussed with IR on call, Ronny Bacon - he will pass on to team who can evaluate at [facility] tomorrow am. Discussed with Hal Hope who will admit.   Lucrezia Starch, MD 01/04/20 2251

## 2020-01-04 NOTE — ED Notes (Signed)
Pt remains in MRI 

## 2020-01-04 NOTE — Consult Note (Signed)
Chief Complaint   Chief Complaint  Patient presents with  . Back Pain  . Headache  . Generalized pain    HPI   Consult requested by: Dr Roslynn Amble, EDP [facility] Reason for consult: Cervical epidural abscess  HPI: [name]. is a 69 y.o. 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 This is patient's third ED visit in past several days, although history has been relatively vague.  As part of work up today, MRI of C/T/L spine were ordered. Patient unable to complete all imaging, but C spine notable for dorsal epidural abscess. A NSY consultation was requested.  Patient has been given multiple rounds of dilaudid prior to my evaluation so obtaining a history is very difficult. He is very fidgety and appears confused so obtaining a thorough exam is difficult. He complains of "diffuse pain". He is unable to really specify. He constantly tells me "I don't know, so I can't explain it". He does admit to alcohol use and crack, although will not further elaborate on these details.   Patient Active Problem List   Diagnosis Date Noted  . Symptomatic anemia 02/11/2019  . Type 2 diabetes mellitus with renal complication ([place]) [id number]/45/[id number]  . ESRD (end stage renal disease) ([place])   . Cardiac murmur 01/08/2019  . Preoperative clearance 01/08/2019  . Uncontrolled Type 2 DM 09/15/2013  . HYPERTENSION 03/08/2013  . CKD  06/24/2012  . Colon cancer, status Post resection  05/11/2012  . Health care maintenance 11/27/2011  . Obesity (BMI 30.0-34.9) 10/20/2011  . Cataract, bilateral 08/29/2011  . Hyperlipidemia LDL goal <70 04/14/2006  . MICROCYTIC ANEMIA 04/14/2006  . TOBACCO ABUSE 04/14/2006  . OBSTRUCTIVE SLEEP APNEA 04/14/2006    PMH: Past Medical History:  Diagnosis Date  . Anemia   . Arthritis    left knee  . Chronic kidney disease    acute renal failure/injury  requiring short-term HD 2013  . Colon cancer ([place])   . Colon polyp    11/2011 - Polyps identified, biopsy - invasive adenocarinoma  . DM II (diabetes mellitus, type II), controlled ([place])    type 2 IDDM x 15 years. A1C 1/09 13.7.   Marland Kitchen 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 12/25/2011  . GERD (gastroesophageal reflux disease)   . Heart murmur   . 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 Hyperlipidemia   . Hypertension    for a few years and resolved in 2013/2014.  . Sleep apnea    does not wear CPAP now  . Wears glasses     PSH: Past Surgical History:  Procedure Laterality Date  . APPLICATION OF WOUND VAC  02/16/2012   Procedure: APPLICATION OF WOUND VAC;  Surgeon: Zenovia Jarred, MD;  Location: [facility];  Service: General;  Laterality: N/A;  . APPLICATION OF WOUND VAC  02/26/2012   Procedure: APPLICATION OF WOUND VAC;  Surgeon: Imogene Burn. [name], MD;  Location: [facility];  Service: General;  Laterality: N/A;  . AV FISTULA PLACEMENT Left 07/29/2019   Procedure: ARTERIOVENOUS (AV) FISTULA CREATION;  Surgeon: Marty Heck, MD;  Location: [facility];  Service: Vascular;  Laterality: Left;  . COLONOSCOPY    . COLONOSCOPY  02/06/2012   Procedure: COLONOSCOPY;  Surgeon: Milus Banister, MD;  Location: [facility];  Service: Endoscopy;;  . COLONOSCOPY WITH PROPOFOL N/A 06/10/2019   Procedure: COLONOSCOPY WITH PROPOFOL;  Surgeon: Milus Banister, MD;  [facility];  Service: Endoscopy;  Laterality: N/A;  . COLOSTOMY REVERSAL    . DRESSING CHANGE UNDER ANESTHESIA  02/18/2012   Procedure: DRESSING CHANGE UNDER ANESTHESIA;  Surgeon: Odis Hollingshead, MD;  Location: [facility];  Service: General;  Laterality: N/A;  . ESOPHAGOGASTRODUODENOSCOPY (EGD) WITH PROPOFOL N/A 06/10/2019   Procedure: ESOPHAGOGASTRODUODENOSCOPY (EGD) WITH PROPOFOL;  Surgeon: Milus Banister, MD;  [facility];  Service: Endoscopy;  Laterality: N/A;  . GANGLION CYST EXCISION  20 YRS AGO   RT ARM  . ILEOSTOMY  02/16/2012    Procedure: ILEOSTOMY;  Surgeon: Zenovia Jarred, MD;  Location: [facility];  Service: General;  Laterality: N/A;  . ILEOSTOMY CLOSURE N/A 09/02/2012   Procedure: ILEOSTOMY REVERSAL;  Surgeon: Imogene Burn. [name], MD;  Location: [facility];  Service: General;  Laterality: N/A;  . ILIOSTOMY    . INCISION AND DRAINAGE OF WOUND  02/23/2012   Procedure: IRRIGATION AND DEBRIDEMENT WOUND;  Surgeon: Joyice Faster. Cornett, MD;  Location: [facility];  Service: General;  Laterality: N/A;  . LAPAROTOMY  02/16/2012   Procedure: EXPLORATORY LAPAROTOMY;  Surgeon: Zenovia Jarred, MD;  [facility];  Service: General;  Laterality: N/A;  Exploratory Laparotomy with resection of anastomosis  . LAPAROTOMY  02/18/2012   Procedure: EXPLORATORY LAPAROTOMY;  Surgeon: Odis Hollingshead, MD;  Location: [facility];  Service: General;  Laterality: N/A;  exploratory laparotomy  change of abdominal vac dressing  . LAPAROTOMY  02/20/2012   Procedure: EXPLORATORY LAPAROTOMY;  Surgeon: Odis Hollingshead, MD;  Location: [facility];  Service: General;  Laterality: N/A;  . LAPAROTOMY  02/23/2012   Procedure: EXPLORATORY LAPAROTOMY;  Surgeon: Joyice Faster. Cornett, MD;  Location: [facility];  Service: General;  Laterality: N/A;  Irrigation and Debridement of abdominal wound with wound vac change with partial closure  . LAPAROTOMY  02/26/2012   Procedure: EXPLORATORY LAPAROTOMY;  Surgeon: Imogene Burn. [name], MD;  Location: [facility];  Service: General;  Laterality: N/A;     . PARTIAL COLECTOMY  02/06/2012  . PARTIAL COLECTOMY  02/06/2012   Procedure: PARTIAL COLECTOMY;  Surgeon: Imogene Burn. [name], MD;  Location: [facility];  Service: General;  Laterality: N/A;  right partial colectomy  . RESECTION SMALL BOWEL / CLOSURE ILEOSTOMY  402/2014   Dr [name]  . VACUUM ASSISTED CLOSURE CHANGE  02/20/2012   Procedure: ABDOMINAL VACUUM ASSISTED CLOSURE CHANGE;  Surgeon: Odis Hollingshead, MD;  Location: [facility];  Service: General;;  . VACUUM ASSISTED CLOSURE CHANGE  02/23/2012   Procedure: ABDOMINAL  VACUUM ASSISTED CLOSURE CHANGE;  Surgeon: Joyice Faster. Cornett, MD;  Location: [facility];  Service: General;  Laterality: N/A;    (Not in a hospital admission)   SH: Social History   Tobacco Use  . Smoking status: Current Every Day Smoker    Packs/day: 0.50    Years: 30.00    Pack years: 15.00    Types: Cigarettes  . Smokeless tobacco: Never Used  Substance Use Topics  . Alcohol use: Yes    Comment: Liquor twice monthly.  . Drug use: Yes    Types: Cocaine    Comment: Crack Cocaine used last night (01/03/20)    MEDS: Prior to Admission medications   Medication Sig Start Date End Date Taking? Authorizing Provider  amLODipine (NORVASC) 10 MG tablet Take 10 mg by mouth at bedtime.     [provider]  atorvastatin (LIPITOR) 20 MG tablet Take 20 mg by mouth  at bedtime.     [provider]  betamethasone dipropionate 0.05 % cream Apply 1 application topically 2 (two) times daily as needed (skin irritation; psoriasis).    [provider]  carbamazepine (TEGRETOL) 200 MG tablet Take 1 tablet (200 mg total) by mouth daily. 12/31/19 01/30/20  Rodell Perna A, PA-C  HYDROcodone-acetaminophen (NORCO/VICODIN) 5-325 MG tablet Take 1 tablet by mouth every 4 (four) hours as needed for moderate pain. Patient not taking: Reported on 12/31/2019 07/29/19 07/28/20  Barbie Banner, PA-C  insulin detemir (LEVEMIR) 100 UNIT/ML injection Inject 0.24 mLs (24 Units total) into the skin at bedtime. Patient taking differently: Inject 26 Units into the skin See admin instructions. Per sliding scale: Most is 22 units and least is 12 units. 12/08/13   Malena Catholic, MD  lidocaine-prilocaine (EMLA) cream Apply 1 application topically once. Apply to needle site 1 hr. Prior to cannulation for hemodialysis.    [provider]  metoprolol tartrate (LOPRESSOR) 50 MG tablet Take 50 mg by mouth 2 (two) times daily.     [provider]  multivitamin (RENA-VIT) TABS tablet Take 1  tablet by mouth daily.    [provider]    ALLERGY: No Known Allergies  Social History   Tobacco Use  . Smoking status: Current Every Day Smoker    Packs/day: 0.50    Years: 30.00    Pack years: 15.00    Types: Cigarettes  . Smokeless tobacco: Never Used  Substance Use Topics  . Alcohol use: Yes    Comment: Liquor twice monthly.     Family History  Problem Relation Age of Onset  . Diabetes Father   . Heart disease Father   . Hypertension Mother   . Diabetes Sister   . Heart disease Sister   . Colon cancer Neg Hx      ROS   ROS unable to obtain  Exam   Vitals:   01/04/20 1630 01/04/20 1833  BP: (!) 194/134 (!) 212/80  Pulse:  (!) 104  Resp: (!) 22 18  Temp:    SpO2: 99% 93%   General appearance: laying in bed, drowsy but easily awakened, fidgeting  Eyes: No scleral injection Cardiovascular: tachycardic Pulmonary: non-labored breathing Musculoskeletal:     Muscle tone upper extremities: Normal    Muscle tone lower extremities: Normal    Motor exam:  Upper Extremities Deltoid Bicep Tricep Grip  Right 5/5 5/5 5/5 5/5  Left 5/5 5/5 5/5 5/5   Lower Extremity IP Quad PF DF EHL  Right 4-/5 4-/5 5/5 5/5 5/5  Left 5/5 5/5 5/5 5/5 5/5   Neurological Mental Status:    - Patient is awake, alert, oriented to person, place, month, year, and situation    - Patient is able to give a clear and coherent history.    - No signs of aphasia or neglect Cranial Nerves    - II: Visual Fields are full. PERRL    - III/IV/VI: EOMI without ptosis or diploplia.     - V: Facial sensation is grossly normal    - VII: Facial movement is symmetric.     - VIII: hearing is intact to voice    - X: Uvula elevates symmetrically    - XI: Shoulder shrug is symmetric.    - XII: tongue is midline without atrophy or fasciculations.  Sensory: Sensation grossly intact to LT  Results - Imaging/Labs   Results for orders placed or performed during the hospital encounter of  01/04/20 (from the past 48 hour(s))  Comprehensive metabolic panel     Status: Abnormal   Collection Time: 01/04/20 12:13 PM  Result Value Ref Range   Sodium 131 (L) 135 - 145 mmol/L   Potassium 4.4 3.5 - 5.1 mmol/L   Chloride 90 (L) 98 - 111 mmol/L   CO2 23 22 - 32 mmol/L   Glucose, Bld 233 (H) 70 - 99 mg/dL    Comment: Glucose reference range applies only to samples taken after fasting for at least 8 hours.   BUN 96 (H) 8 - 23 mg/dL   Creatinine, Ser 15.45 (H) 0.61 - 1.24 mg/dL   Calcium 8.7 (L) 8.9 - 10.3 mg/dL   Total Protein 9.1 (H) 6.5 - 8.1 g/dL   Albumin 3.2 (L) 3.5 - 5.0 g/dL   AST 21 15 - 41 U/L   ALT 20 0 - 44 U/L   Alkaline Phosphatase 84 38 - 126 U/L   Total Bilirubin 1.1 0.3 - 1.2 mg/dL   GFR calc non Af Amer 3 (L) >60 mL/min   GFR calc Af Amer 3 (L) >60 mL/min   Anion gap 18 (H) 5 - 15    Comment: Performed at [facility], [place] [address], [place], [place] [address]  CBC with Differential     Status: Abnormal   Collection Time: 01/04/20 12:13 PM  Result Value Ref Range   WBC 25.3 (H) 4.0 - 10.5 K/uL   RBC 3.79 (L) 4.22 - 5.81 MIL/uL   Hemoglobin 10.0 (L) 13.0 - 17.0 g/dL   HCT 28.6 (L) 39 - 52 %   MCV 75.5 (L) 80.0 - 100.0 fL   MCH 26.4 26.0 - 34.0 pg   MCHC 35.0 30.0 - 36.0 g/dL   RDW 17.1 (H) 11.5 - 15.5 %   Platelets 333 150 - 400 K/uL   nRBC 0.0 0.0 - 0.2 %   Neutrophils Relative % 89 %   Neutro Abs 22.5 (H) 1.7 - 7.7 K/uL   Lymphocytes Relative 4 %   Lymphs Abs 1.0 0.7 - 4.0 K/uL   Monocytes Relative 6 %   Monocytes Absolute 1.5 (H) 0 - 1 K/uL   Eosinophils Relative 0 %   Eosinophils Absolute 0.0 0 - 0 K/uL   Basophils Relative 0 %   Basophils Absolute 0.0 0 - 0 K/uL   Immature Granulocytes 1 %   Abs Immature Granulocytes 0.28 (H) 0.00 - 0.07 K/uL    Comment: Performed at [facility], [place] [address], [place], [place] [address]  Protime-INR     Status: Abnormal   Collection Time: 01/04/20 12:13 PM  Result  Value Ref Range   Prothrombin Time 15.6 (H) 11.4 - 15.2 seconds   INR 1.3 (H) 0.8 - 1.2    Comment: (NOTE) INR goal varies based on device and disease states. Performed at [facility], [place] [address] [place], [place] [address]   Lactic acid, plasma     Status: None   Collection Time: 01/04/20  2:00 PM  Result Value Ref Range   Lactic Acid, Venous 1.7 0.5 - 1.9 mmol/L    Comment: Performed at [facility], [place] [address] [place], [place] [address]  CBG monitoring, ED     Status: Abnormal   Collection Time: 01/04/20  2:16 PM  Result Value Ref Range   Glucose-Capillary 192 (H) 70 - 99 mg/dL    Comment: Glucose reference range applies only to samples taken  after fasting for at least 8 hours.  SARS Coronavirus 2 by RT PCR (hospital order, performed in [facility] hospital lab) Nasopharyngeal Nasopharyngeal Swab     Status: None   Collection Time: 01/04/20  2:45 PM   Specimen: Nasopharyngeal Swab  Result Value Ref Range   SARS Coronavirus 2 NEGATIVE NEGATIVE    Comment: (NOTE) SARS-CoV-2 target nucleic acids are NOT DETECTED.  The SARS-CoV-2 RNA is generally detectable in upper and lower respiratory specimens during the acute phase of infection. The lowest concentration of SARS-CoV-2 viral copies this assay can detect is 250 copies / mL. A negative result does not preclude SARS-CoV-2 infection and should not be used as the sole basis for treatment or other patient management decisions.  A negative result may occur with improper specimen collection / handling, submission of specimen other than nasopharyngeal swab, presence of viral mutation(s) within the areas targeted by this assay, and inadequate number of viral copies (<250 copies / mL). A negative result must be combined with clinical observations, patient history, and epidemiological information.  Fact Sheet for Patients:   StrictlyIdeas.no  Fact Sheet for  Healthcare Providers: BankingDealers.co.za  This test is not yet approved or  cleared by the [place] FDA and has been authorized for detection and/or diagnosis of SARS-CoV-2 by FDA under an Emergency Use Authorization (EUA).  This EUA will remain in effect (meaning this test can be used) for the duration of the COVID-19 declaration under Section 564(b)(1) of the Act, 21 U.S.C. section 360bbb-3(b)(1), unless the authorization is terminated or revoked sooner.  Performed at [facility], [place] [address], [place], [place] [address]     DG Chest 2 View  Result Date: 01/04/2020 CLINICAL DATA:  Back pain, weakness and shortness of breath EXAM: CHEST - 2 VIEW COMPARISON:  Radiograph 12/30/2019 FINDINGS: Tunneled dual lumen dialysis catheter tip terminates at the superior cavoatrial junction, similar position to prior. Cardiomediastinal contours are unremarkable. Telemetry leads overlie the chest. Some hazy interstitial opacities are present throughout the lungs with central vascular congestion and cephalization. No visible pneumothorax or effusion. No acute osseous or soft tissue abnormality. IMPRESSION: Hazy opacities likely reflect edema/volume overload given vascular congestion and redistribution though should correlate with clinical setting as atypical infection could present similarly. Electronically Signed   By: Lovena Le M.D.   On: 01/04/2020 15:02   MR CERVICAL SPINE WO CONTRAST  Result Date: 01/04/2020 CLINICAL DATA:  Severe diffuse back pain. Study was ordered without and contrast. None contrast imaging the cervical spine was completed. Patient refused further imaging due to pain. Study is moderately degraded by patient motion. EXAM: MRI CERVICAL SPINE WITHOUT CONTRAST TECHNIQUE: Multiplanar, multisequence MR imaging of the cervical spine was performed. No intravenous contrast was administered. COMPARISON:  MR head without contrast 12/31/2019.  FINDINGS: Alignment: Slight retrolisthesis is present at C5-6. Degenerative anterolisthesis is present T1-2. Reversal of the normal cervical lordosis is noted. Vertebrae: Decreased T1 marrow signal is present in the lower cervical spine, particularly C7 and T1. Vertebral body heights are maintained. Cord: T2 signal hyperintensity is present in the spinal cord from C3-4 through C5. Posterior Fossa, vertebral arteries, paraspinal tissues: Visualized brainstem and cerebellum are normal. Diffuse edema is present in the occipital paraspinous musculature bilaterally. No discrete abscess is present. Extensive prevertebral edema extends from the clivus to C6. No abscess is present. Mild edema is present in the posterior paraspinous muscles, left greater than right. Disc levels: a abnormal fluid present in the dorsal spinal canal  extending from foramen magnum to the T2 level. Severe disc disease present at C5-6 and C6-7. The cord is compressed at these levels. Mild disc disease present at C3-4 and C4-5 with some cord compression at these levels. IMPRESSION: 1. Abnormal fluid in the dorsal spinal canal extending from foramen magnum to the T2 level. Given the other inflammatory changes and concern, this most likely represents epidural infection. There is some fluid ventral to the spinal cord at C2. 2. Diffuse prevertebral edema from the clivus to C6 is also concerning for infection. 3. The cord is compressed without abnormal signal suggesting edema. 4. Mild edema in the posterior paraspinous muscles, left greater than right. Edema is most significant within the paraspinous muscles just below the occiput. No focal abscess is present. 5. Severe disc disease at C5-6 and C6-7 with moderate central canal stenosis at both levels. 6. Mild disc disease at C3-4 and C4-5 with some cord compression at these levels. 7. Abnormal marrow signal in the lower cervical spine, particularly C7 and T1. This is nonspecific and can be seen in the  setting of anemia, smoking, or obesity. Electronically Signed   By: [place] Morelle M.D.   On: 01/04/2020 18:51   Impression/Plan   69 y.o. male who presented with generalized pain including neck and LBP found to have fairly significant dorsal epidural abscess with resultant mod-severe central canal stenosis. MRI T/L spine unable to be completed. Patient is drowsy from medication and encephalopathic so obtaining a thorough neuro exam is difficult. He does have mild right IP and quad weakness, but otherwise appears grossly motor intact otherwise.   Imaging reviewed with Dr Kathyrn Sheriff. Given degree of cervical stenosis, patient will potentially need to undergo cervical decompression and drainage of abscess. Will await T/L spine MRI prior to making that decision to assess extent of infection. We may need to discuss with IR about aspiration depending on how the other MRIs look.  In the interim, patient to be admitted under [facility] for further work up and management. Rec ID consult. Would hold abx until tissue sample obtained, unless deemed necessary per admitting team. NPO at midnight.   Ferne Reus, PA-C [facility] [employer]

## 2020-01-04 NOTE — ED Notes (Signed)
Lab notified to process existing save tubes per orders.

## 2020-01-04 NOTE — ED Notes (Signed)
Fistula present left arm. Thrill and bruit present.

## 2020-01-04 NOTE — ED Notes (Signed)
Condom catheter, M was applied by EMT & RN, PT genital care was provided prior application, assured secure fit.

## 2020-01-05 ENCOUNTER — Inpatient Hospital Stay (HOSPITAL_COMMUNITY): Payer: No Typology Code available for payment source

## 2020-01-05 ENCOUNTER — Other Ambulatory Visit: Payer: Self-pay | Admitting: Neurosurgery

## 2020-01-05 DIAGNOSIS — I120 Hypertensive chronic kidney disease with stage 5 chronic kidney disease or end stage renal disease: Secondary | ICD-10-CM

## 2020-01-05 DIAGNOSIS — G062 Extradural and subdural abscess, unspecified: Secondary | ICD-10-CM | POA: Diagnosis not present

## 2020-01-05 DIAGNOSIS — E1122 Type 2 diabetes mellitus with diabetic chronic kidney disease: Secondary | ICD-10-CM

## 2020-01-05 DIAGNOSIS — A419 Sepsis, unspecified organism: Secondary | ICD-10-CM | POA: Diagnosis present

## 2020-01-05 DIAGNOSIS — R7881 Bacteremia: Secondary | ICD-10-CM | POA: Diagnosis present

## 2020-01-05 DIAGNOSIS — B9562 Methicillin resistant Staphylococcus aureus infection as the cause of diseases classified elsewhere: Secondary | ICD-10-CM | POA: Diagnosis present

## 2020-01-05 LAB — BASIC METABOLIC PANEL
Anion gap: 20 — ABNORMAL HIGH (ref 5–15)
BUN: 70 mg/dL — ABNORMAL HIGH (ref 8–23)
CO2: 22 mmol/L (ref 22–32)
Calcium: 8.6 mg/dL — ABNORMAL LOW (ref 8.9–10.3)
Chloride: 94 mmol/L — ABNORMAL LOW (ref 98–111)
Creatinine, Ser: 16.74 mg/dL — ABNORMAL HIGH (ref 0.61–1.24)
GFR calc Af Amer: 3 mL/min — ABNORMAL LOW (ref >60)
GFR calc non Af Amer: 3 mL/min — ABNORMAL LOW (ref >60)
Glucose, Bld: 192 mg/dL — ABNORMAL HIGH (ref 70–99)
Potassium: 4.5 mmol/L (ref 3.5–5.1)
Sodium: 136 mmol/L (ref 135–145)

## 2020-01-05 LAB — GLUCOSE, CAPILLARY
Glucose-Capillary: 166 mg/dL — ABNORMAL HIGH (ref 70–99)
Glucose-Capillary: 175 mg/dL — ABNORMAL HIGH (ref 70–99)

## 2020-01-05 LAB — BLOOD CULTURE ID PANEL (REFLEXED) - BCID2

## 2020-01-05 LAB — CBG MONITORING, ED
Glucose-Capillary: 149 mg/dL — ABNORMAL HIGH (ref 70–99)
Glucose-Capillary: 154 mg/dL — ABNORMAL HIGH (ref 70–99)
Glucose-Capillary: 169 mg/dL — ABNORMAL HIGH (ref 70–99)
Glucose-Capillary: 172 mg/dL — ABNORMAL HIGH (ref 70–99)

## 2020-01-05 LAB — CBC
HCT: 25.3 % — ABNORMAL LOW (ref 39.0–52.0)
Hemoglobin: 8.8 g/dL — ABNORMAL LOW (ref 13.0–17.0)
MCH: 25.9 pg — ABNORMAL LOW (ref 26.0–34.0)
MCHC: 34.8 g/dL (ref 30.0–36.0)
MCV: 74.4 fL — ABNORMAL LOW (ref 80.0–100.0)
Platelets: 266 10*3/uL (ref 150–400)
RBC: 3.4 MIL/uL — ABNORMAL LOW (ref 4.22–5.81)
RDW: 17.2 % — ABNORMAL HIGH (ref 11.5–15.5)
WBC: 24.1 10*3/uL — ABNORMAL HIGH (ref 4.0–10.5)
nRBC: 0 % (ref 0.0–0.2)

## 2020-01-05 LAB — URINE CULTURE: Culture: NO GROWTH

## 2020-01-05 IMAGING — MR MR THORACIC SPINE W/O CM
4 of 6 series · 19 of 48 positions shown · non-contrast
Comparison: None.

CLINICAL DATA: Epidural fluid collection.  Back pain and neck pain.

EXAM:
MRI THORACIC SPINE WITHOUT CONTRAST
TECHNIQUE: Multiplanar, multisequence MR imaging of the thoracic spine was
performed. No intravenous contrast was administered.

[Series 4: T1 · sagittal · 3.0mm · 0.90mm/px · 3 of 14 slices shown (1 of 2)]
[im 1/14]
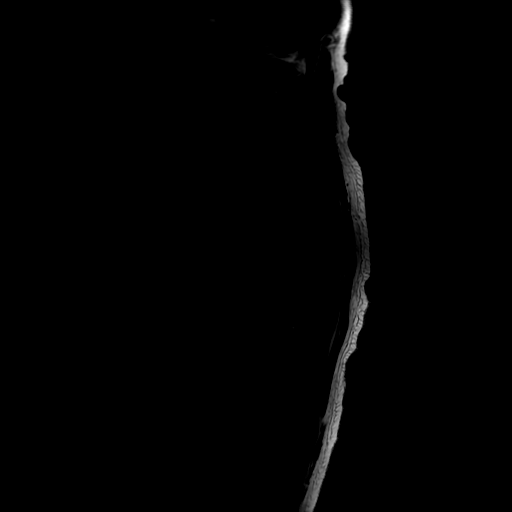
[im 7/14]
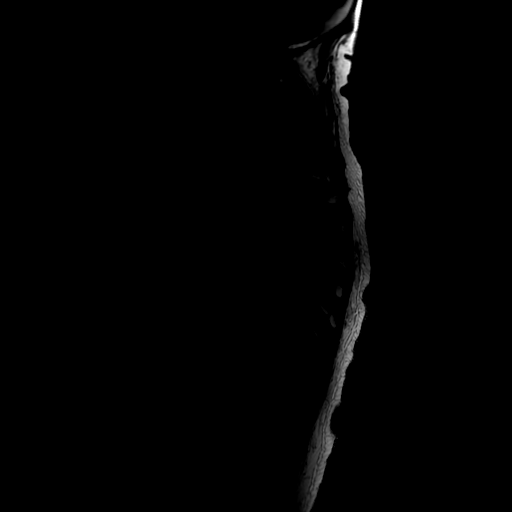
[im 14/14]
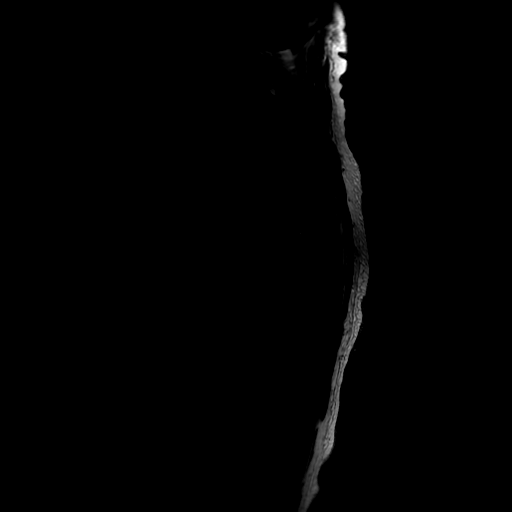

[Series 7: T2 · sagittal · 3.0mm · 0.66mm/px · 6 of 17 slices shown (1 of 2)]
[im 1/17]
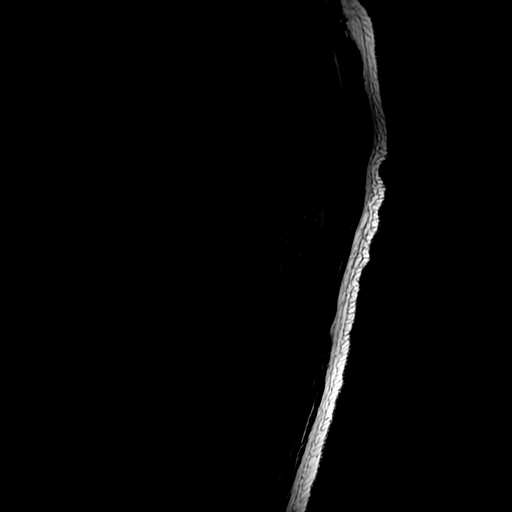
[im 4/17]
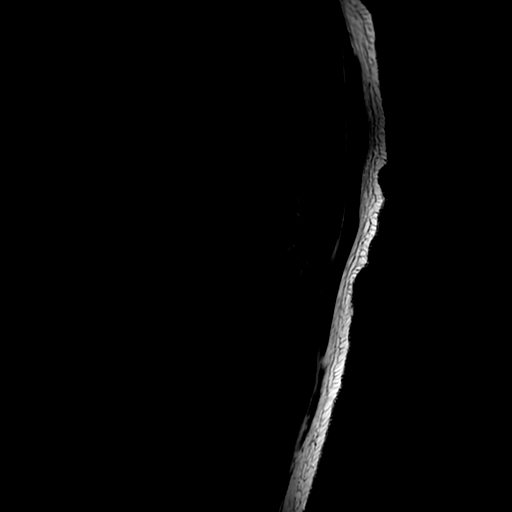
[im 7/17]
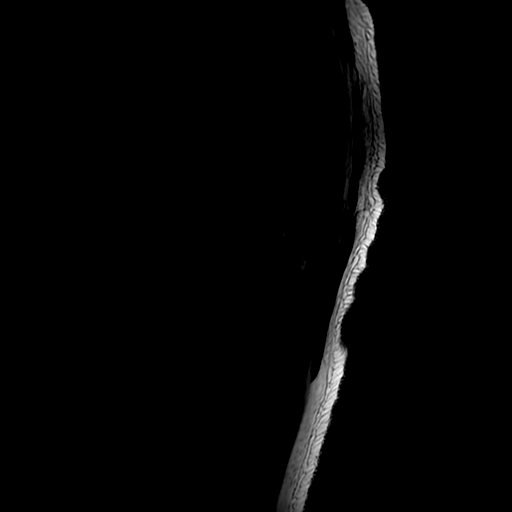
[im 10/17]
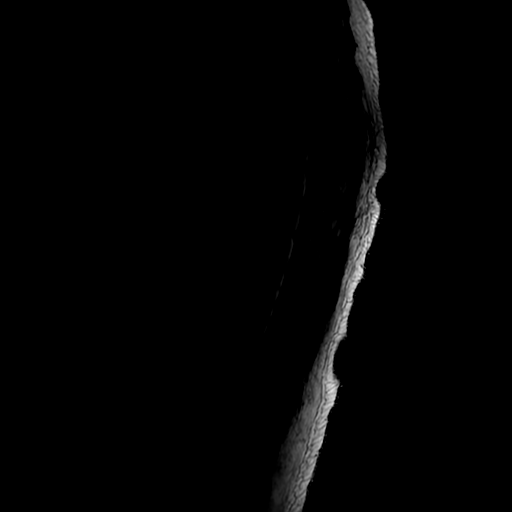
[im 13/17]
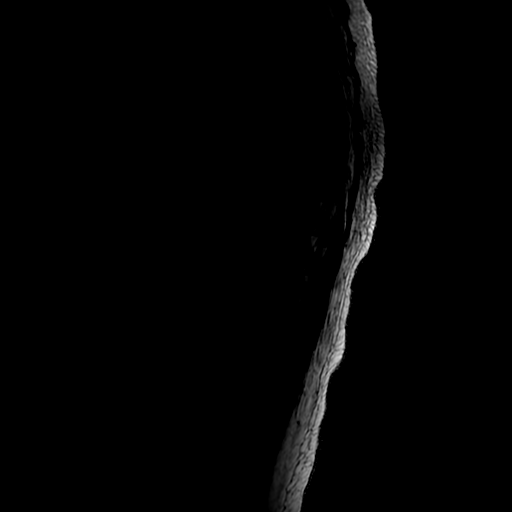
[im 17/17]
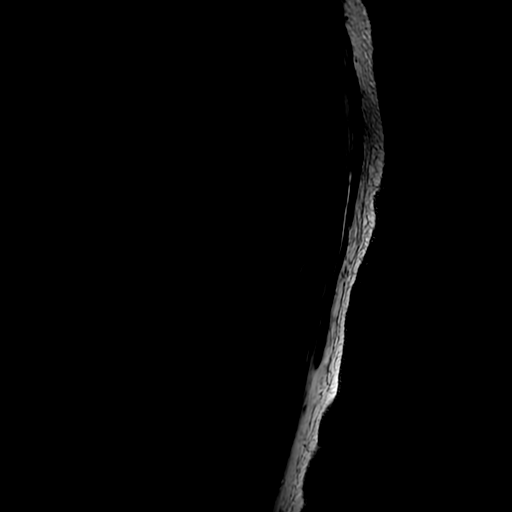

[Series 9: T1 · sagittal · 3.0mm · 0.66mm/px · 3 of 17 slices shown (2 of 2)]
[im 4/17]
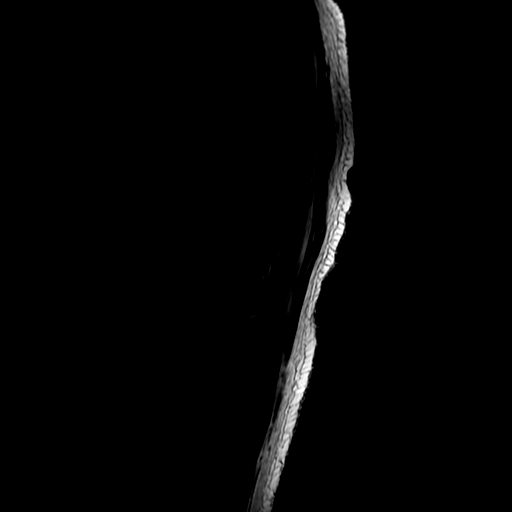
[im 10/17]
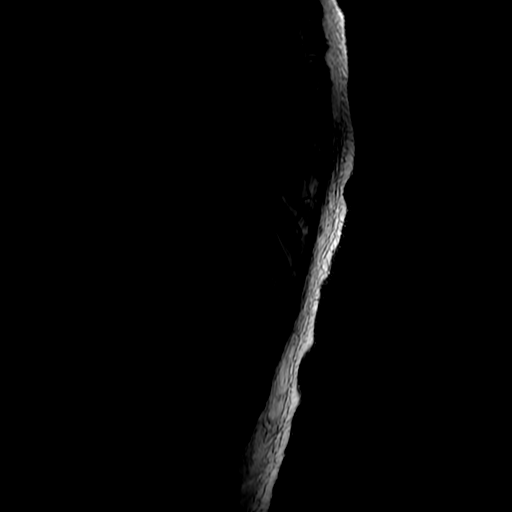
[im 17/17]
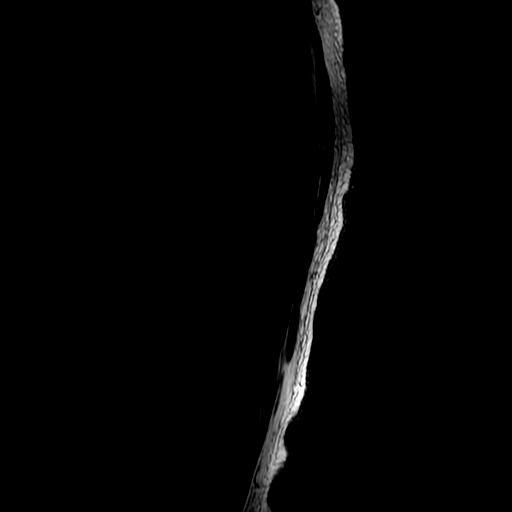

[Series 10: T2 · axial · 4.0mm · 0.39mm/px · z∈[-346,-120]mm · 7 of 54 slices shown (2 of 2)]
[im 3/54]
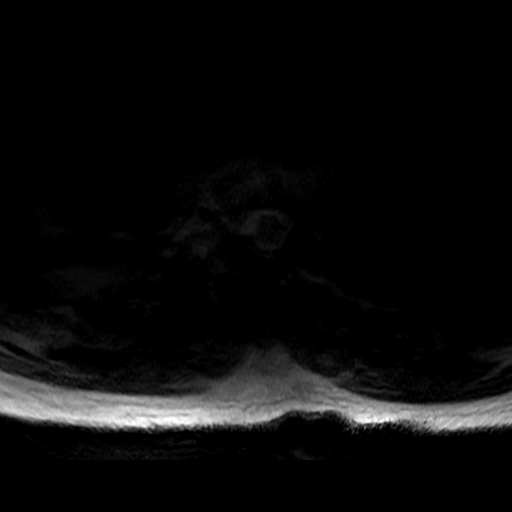
[im 9/54]
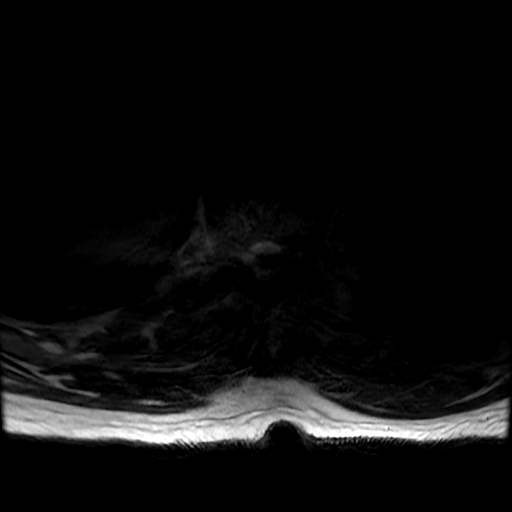
[im 15/54]
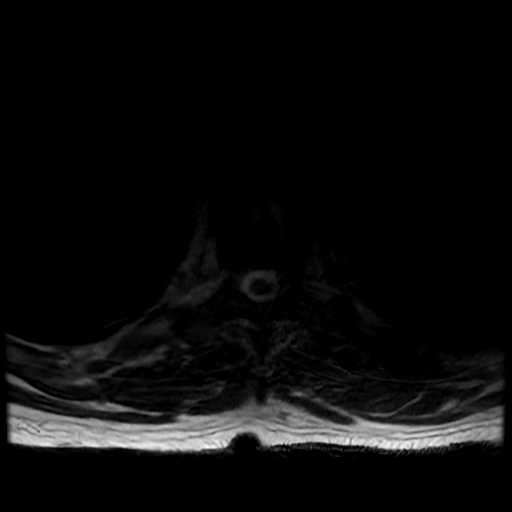
[im 24/54]
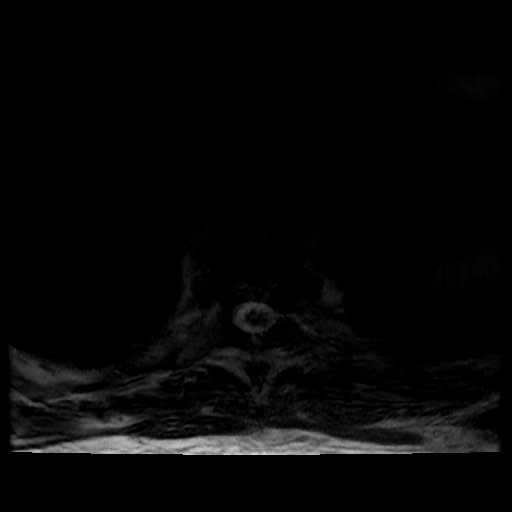
[im 27/54]
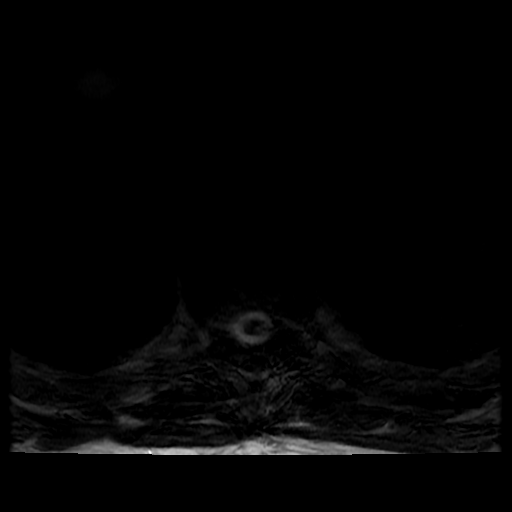
[im 30/54]
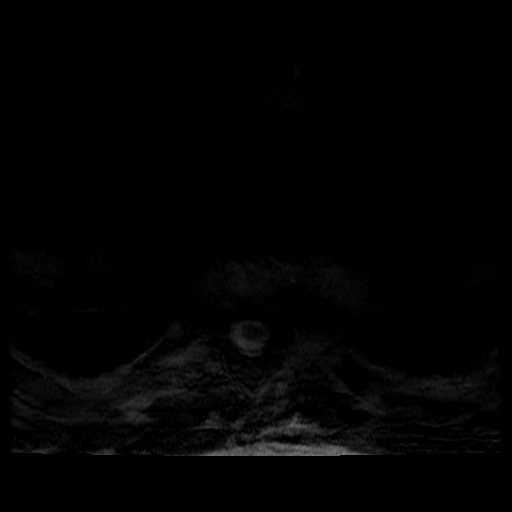
[im 45/54]
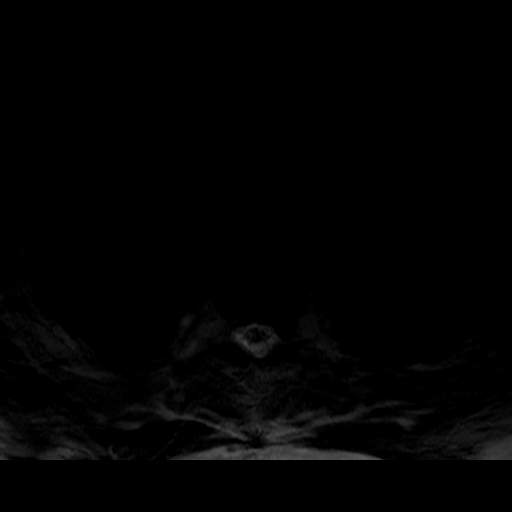

[19 of 48 positions shown; findings below may reference images not displayed]

FINDINGS: Alignment:  Physiologic.

Vertebrae: No fracture, evidence of discitis, or bone lesion.

Cord: Dorsal epidural fluid collection extending from the cervical
spine to T2 level is again seen. There is no continuation of the
collection below the T2 level. Spinal cord signal is normal.

Paraspinal and other soft tissues: Negative.

Disc levels:

At T2-3, there is a small right subarticular disc protrusion. There
is no other thoracic disc herniation. No spinal canal or neural
foraminal stenosis.
IMPRESSION: 1. Dorsal epidural fluid collection extending from the cervical
spine to T2 level, consistent with epidural abscess. No continuation
below the T2 level.
2. No thoracic spinal canal or neural foraminal stenosis.

## 2020-01-05 MED ORDER — CHLORHEXIDINE GLUCONATE CLOTH 2 % EX PADS
6.0000 | MEDICATED_PAD | Freq: Every day | CUTANEOUS | Status: DC
  Administered 2020-01-06 – 2020-01-09 (×2): 6 via TOPICAL

## 2020-01-05 MED ORDER — SENNOSIDES-DOCUSATE SODIUM 8.6-50 MG PO TABS: 2.0000 | ORAL_TABLET | Freq: Every evening | ORAL | Status: DC | PRN

## 2020-01-05 MED ORDER — ATORVASTATIN CALCIUM 10 MG PO TABS
20.0000 mg | ORAL_TABLET | Freq: Every day | ORAL | Status: DC
  Administered 2020-01-06 (×2): 20 mg via ORAL
  Filled 2020-01-05 (×6): qty 2

## 2020-01-05 MED ORDER — MORPHINE SULFATE (PF) 2 MG/ML IV SOLN
1.0000 mg | Freq: Once | INTRAVENOUS | Status: AC
  Administered 2020-01-05: 1 mg via INTRAVENOUS
  Filled 2020-01-05: qty 1

## 2020-01-05 MED ORDER — HYDROMORPHONE HCL 1 MG/ML IJ SOLN
1.0000 mg | Freq: Once | INTRAMUSCULAR | Status: AC
  Administered 2020-01-05: 1 mg via INTRAVENOUS
  Filled 2020-01-05: qty 1

## 2020-01-05 MED ORDER — AMLODIPINE BESYLATE 10 MG PO TABS
10.0000 mg | ORAL_TABLET | Freq: Every day | ORAL | Status: DC
  Administered 2020-01-06 (×2): 10 mg via ORAL
  Filled 2020-01-05 (×6): qty 1

## 2020-01-05 MED ORDER — METOPROLOL TARTRATE 12.5 MG HALF TABLET
50.0000 mg | ORAL_TABLET | Freq: Two times a day (BID) | ORAL | Status: DC
  Administered 2020-01-06 – 2020-01-08 (×5): 50 mg via ORAL
  Filled 2020-01-05 (×7): qty 1
  Filled 2020-01-05: qty 4
  Filled 2020-01-05: qty 1
  Filled 2020-01-05: qty 4
  Filled 2020-01-05: qty 1

## 2020-01-05 MED ORDER — VANCOMYCIN VARIABLE DOSE PER UNSTABLE RENAL FUNCTION (PHARMACIST DOSING): Status: DC

## 2020-01-05 MED ORDER — VANCOMYCIN HCL 2000 MG/400ML IV SOLN
2000.0000 mg | INTRAVENOUS | Status: AC
  Administered 2020-01-05: 2000 mg via INTRAVENOUS
  Filled 2020-01-05: qty 400

## 2020-01-05 MED ORDER — POLYETHYLENE GLYCOL 3350 17 G PO PACK
17.0000 g | PACK | Freq: Every day | ORAL | Status: DC | PRN
  Administered 2020-01-08: 17 g via ORAL
  Filled 2020-01-05: qty 1

## 2020-01-05 NOTE — ED Notes (Signed)
PT was returned to bed via stand and pivot assist x2, PT returned to position of comfort, call bell in reach, re-connected to monitor.

## 2020-01-05 NOTE — Progress Notes (Signed)
PROGRESS NOTE    [name].  AJO:[id number] DOB: 08-Oct-1950 DOA: 01/04/2020 PCP: [facility]   Brief Narrative:  69 y.o. male with history of ESRD on hemodialysis on Monday Wednesday Friday, hypertension, diabetes mellitus type 2, anemia history of colon cancer status post resection has been having increasing headache back pain over the last week or so.  Patient had come to the ER about 7 days ago with headache at that time CT head was unremarkable and was discharged on carbamazepine for possible trigeminal neuralgia.  Upon admission patient's MRI showed cord compression in the cervical region, neurosurgery was consulted.  Blood cultures grew MRSA in all 4 bottles.  Started on vancomycin.   Assessment & Plan:   Principal Problem:   Epidural abscess Active Problems:   OBSTRUCTIVE SLEEP APNEA   Colon cancer, status Post resection    ESRD (end stage renal disease) ([place])   Acute encephalopathy   Sepsis ([place])   Sepsis secondary to MRSA bacteremia, POA Cervical cord compression, epidural abscess Bilateral upper and lower extremity weakness -Patient is all follow-up blood cultures are growing MRSA, will go and start patient on vancomycin.  Seen by IR, no plans for aspiration.  Patient is already growing MRSA.  Spoke with neurosurgery, PA who will plan on decompression surgery tomorrow afternoon. -Echocardiogram ordered.  Will need surveillance cultures in 48 hours to ensure clearance of bacteremia.  -Dr. Megan Salon from ID consulted -MRI lumbar spine and thoracic spine pending  ESRD on hemodialysis Monday Wednesday Friday -Spoke with Dr. Marval Regal.  Planning for HD tomorrow.  Anemia of chronic disease -Closely monitor.  Transfuse if necessary.  Acute encephalopathy -Underlying uremia and infection  Diabetes mellitus type 2 -Sliding scale and Accu-Chek for now.  Essential hypertension -Home p.o. medications on hold while he is bacteremic.  Will maintain and  treated with IV hydralazine as needed  History of colon cancer status post resection    DVT prophylaxis: SCDs Start: 01/04/20 2212   Code Status: Full code Family Communication: None at bedside  Status is: Inpatient  Remains inpatient appropriate because:Hemodynamically unstable and IV treatments appropriate due to intensity of illness or inability to take PO   Dispo: The patient is from: Home              Anticipated d/c is to: Home              Anticipated d/c date is: > 3 days              Patient currently is not medically stable to d/c.  Plans for decompressive surgery to the cervical spine tomorrow in the meantime ongoing evaluation for bacteremia       There is no height or weight on file to calculate BMI.     Subjective: Patient seen and examined at bedside complains of generalized weakness but no particular complaints.  Remains afebrile overnight.  Review of Systems Otherwise negative except as per HPI, including: General: Denies fever, chills, night sweats or unintended weight loss. Resp: Denies cough, wheezing, shortness of breath. Cardiac: Denies chest pain, palpitations, orthopnea, paroxysmal nocturnal dyspnea. GI: Denies abdominal pain, nausea, vomiting, diarrhea or constipation GU: Denies dysuria, frequency, hesitancy or incontinence MS: Denies muscle aches, joint pain or swelling Neuro: Denies headache, neurologic deficits (focal weakness, numbness, tingling), abnormal gait Psych: Denies anxiety, depression, SI/HI/AVH Skin: Denies new rashes or lesions ID: Denies sick contacts, exotic exposures, travel  Examination:  General exam: Appears calm and comfortable  Respiratory system: Clear  to auscultation. Respiratory effort normal. Cardiovascular system: S1 & S2 heard, RRR. No JVD, murmurs, rubs, gallops or clicks. No pedal edema. Gastrointestinal system: Abdomen is nondistended, soft and nontender. No organomegaly or masses felt. Normal bowel sounds  heard. Central nervous system: Alert and oriented. No focal neurological deficits. Extremities: Strength is 4/5 in all extremities Skin: No rashes, lesions or ulcers Psychiatry: Judgement and insight appear normal. Mood & affect appropriate.  Left upper extremity AV graft noted External Foley catheter.   Objective: Vitals:   01/05/20 0916 01/05/20 1000 01/05/20 1045 01/05/20 1311  BP: (!) 196/80 (!) 158/88 (!) 177/76 (!) 169/84  Pulse: (!) 102 (!) 103 100 (!) 103  Resp: 20 18 18  (!) 24  Temp:    99 F (37.2 C)  TempSrc:    Oral  SpO2: 94% 98% 100% 100%    Intake/Output Summary (Last 24 hours) at 01/05/2020 1404 Last data filed at 01/05/2020 1053 Gross per 24 hour  Intake 400 ml  Output 150 ml  Net 250 ml   There were no vitals filed for this visit.   Data Reviewed:   CBC: Recent Labs  Lab 12/30/19 2212 01/03/20 1524 01/04/20 1213 01/05/20 0502  WBC 15.0* 25.8* 25.3* 24.1*  NEUTROABS  --   --  22.5*  --   HGB 8.6* 9.5* 10.0* 8.8*  HCT 25.6* 27.4* 28.6* 25.3*  MCV 76.9* 74.1* 75.5* 74.4*  PLT 180 276 333 073   Basic Metabolic Panel: Recent Labs  Lab 12/30/19 2212 01/03/20 1524 01/04/20 1213 01/05/20 0502  NA 132* 132* 131* 136  K 3.3* 3.8 4.4 4.5  CL 96* 93* 90* 94*  CO2 23 23 23 22   GLUCOSE 150* 175* 233* 192*  BUN 71* 87* 96* 70*  CREATININE 14.32* 14.34* 15.45* 16.74*  CALCIUM 8.3* 8.5* 8.7* 8.6*   GFR: Estimated Creatinine Clearance: 4.6 mL/min (A) (by C-G formula based on SCr of 16.74 mg/dL (H)). Liver Function Tests: Recent Labs  Lab 01/04/20 1213  AST 21  ALT 20  ALKPHOS 84  BILITOT 1.1  PROT 9.1*  ALBUMIN 3.2*   No results for input(s): LIPASE, AMYLASE in the last 168 hours. No results for input(s): AMMONIA in the last 168 hours. Coagulation Profile: Recent Labs  Lab 12/30/19 2212 01/04/20 1213  INR 1.3* 1.3*   Cardiac Enzymes: No results for input(s): CKTOTAL, CKMB, CKMBINDEX, TROPONINI in the last 168 hours. BNP (last 3  results) No results for input(s): PROBNP in the last 8760 hours. HbA1C: No results for input(s): HGBA1C in the last 72 hours. CBG: Recent Labs  Lab 01/04/20 1416 01/05/20 0017 01/05/20 0333 01/05/20 0719 01/05/20 1231  GLUCAP 192* 154* 149* 172* 169*   Lipid Profile: No results for input(s): CHOL, HDL, LDLCALC, TRIG, CHOLHDL, LDLDIRECT in the last 72 hours. Thyroid Function Tests: No results for input(s): TSH, T4TOTAL, FREET4, T3FREE, THYROIDAB in the last 72 hours. Anemia Panel: No results for input(s): VITAMINB12, FOLATE, FERRITIN, TIBC, IRON, RETICCTPCT in the last 72 hours. Sepsis Labs: Recent Labs  Lab 01/04/20 1400  LATICACIDVEN 1.7    Recent Results (from the past 240 hour(s))  Culture, blood (routine x 2)     Status: None (Preliminary result)   Collection Time: 01/04/20 12:13 PM   Specimen: BLOOD RIGHT HAND  Result Value Ref Range Status   Specimen Description   Final    BLOOD RIGHT HAND Performed at [facility] [address], [place],  [address]    Special Requests  Final    BOTTLES DRAWN AEROBIC AND ANAEROBIC Blood Culture adequate volume Performed at [facility] [address] [place], [place] [address]    Culture  Setup Time   Final    GRAM POSITIVE COCCI IN BOTH AEROBIC AND ANAEROBIC BOTTLES CRITICAL VALUE NOTED.  VALUE IS CONSISTENT WITH PREVIOUSLY REPORTED AND CALLED VALUE.    Culture   Final    NO GROWTH < 24 HOURS Performed at [facility], [place] [address], [place], [place] [address]    Report Status PENDING  Incomplete  Culture, blood (routine x 2)     Status: None (Preliminary result)   Collection Time: 01/04/20 12:13 PM   Specimen: Site Not Specified; Blood  Result Value Ref Range Status   Specimen Description   Final    SITE NOT SPECIFIED Performed at [facility] [address], [place], [place] [address]    Special Requests   Final    BOTTLES DRAWN AEROBIC AND  ANAEROBIC Blood Culture adequate volume Performed at [facility] [address], [place], [place] [address]    Culture  Setup Time   Final    GRAM POSITIVE COCCI IN BOTH AEROBIC AND ANAEROBIC BOTTLES Organism ID to follow CRITICAL RESULT CALLED TO, READ BACK BY AND VERIFIED WITH: D. WOFFORD PHARMD, AT 0745 01/05/20 BY Rush Landmark Performed at [facility], [place] [address] [place], [place] [address]    Culture GRAM POSITIVE COCCI  Final   Report Status PENDING  Incomplete  Blood Culture ID Panel (Reflexed)     Status: Abnormal   Collection Time: 01/04/20 12:13 PM  Result Value Ref Range Status   Enterococcus faecalis NOT DETECTED NOT DETECTED Final   Enterococcus Faecium NOT DETECTED NOT DETECTED Final   Listeria monocytogenes NOT DETECTED NOT DETECTED Final   Staphylococcus species DETECTED (A) NOT DETECTED Final    Comment: CRITICAL RESULT CALLED TO, READ BACK BY AND VERIFIED WITH: D. WOFFORD PHARMD, AT 0745 01/05/20 BY D. VANHOOK    Staphylococcus aureus (BCID) DETECTED (A) NOT DETECTED Final    Comment: Methicillin (oxacillin)-resistant Staphylococcus aureus (MRSA). MRSA is predictably resistant to beta-lactam antibiotics (except ceftaroline). Preferred therapy is vancomycin unless clinically contraindicated. Patient requires contact precautions if  hospitalized. CRITICAL RESULT CALLED TO, READ BACK BY AND VERIFIED WITH: D. WOFFORD PHARMD, AT 0745 01/05/20 BY D. VANHOOK    Staphylococcus epidermidis NOT DETECTED NOT DETECTED Final   Staphylococcus lugdunensis NOT DETECTED NOT DETECTED Final   Streptococcus species NOT DETECTED NOT DETECTED Final   Streptococcus agalactiae NOT DETECTED NOT DETECTED Final   Streptococcus pneumoniae NOT DETECTED NOT DETECTED Final   Streptococcus pyogenes NOT DETECTED NOT DETECTED Final   A.calcoaceticus-baumannii NOT DETECTED NOT DETECTED Final   Bacteroides fragilis NOT DETECTED NOT DETECTED Final   Enterobacterales NOT  DETECTED NOT DETECTED Final   Enterobacter cloacae complex NOT DETECTED NOT DETECTED Final   Escherichia coli NOT DETECTED NOT DETECTED Final   Klebsiella aerogenes NOT DETECTED NOT DETECTED Final   Klebsiella oxytoca NOT DETECTED NOT DETECTED Final   Klebsiella pneumoniae NOT DETECTED NOT DETECTED Final   Proteus species NOT DETECTED NOT DETECTED Final   Salmonella species NOT DETECTED NOT DETECTED Final   Serratia marcescens NOT DETECTED NOT DETECTED Final   Haemophilus influenzae NOT DETECTED NOT DETECTED Final   Neisseria meningitidis NOT DETECTED NOT DETECTED Final   Pseudomonas aeruginosa NOT DETECTED NOT DETECTED Final   Stenotrophomonas maltophilia NOT DETECTED NOT DETECTED  Final   Candida albicans NOT DETECTED NOT DETECTED Final   Candida auris NOT DETECTED NOT DETECTED Final   Candida glabrata NOT DETECTED NOT DETECTED Final   Candida krusei NOT DETECTED NOT DETECTED Final   Candida parapsilosis NOT DETECTED NOT DETECTED Final   Candida tropicalis NOT DETECTED NOT DETECTED Final   Cryptococcus neoformans/gattii NOT DETECTED NOT DETECTED Final   Meth resistant mecA/C and MREJ DETECTED (A) NOT DETECTED Final    Comment: CRITICAL RESULT CALLED TO, READ BACK BY AND VERIFIED WITH: D. WOFFORD PHARMD, AT 0745 01/05/20 BY Rush Landmark Performed at [facility], [place] [address], [place], [place] [address]   SARS Coronavirus 2 by RT PCR (hospital order, performed in [facility] hospital lab) Nasopharyngeal Nasopharyngeal Swab     Status: None   Collection Time: 01/04/20  2:45 PM   Specimen: Nasopharyngeal Swab  Result Value Ref Range Status   SARS Coronavirus 2 NEGATIVE NEGATIVE Final    Comment: (NOTE) SARS-CoV-2 target nucleic acids are NOT DETECTED.  The SARS-CoV-2 RNA is generally detectable in upper and lower respiratory specimens during the acute phase of infection. The lowest concentration of SARS-CoV-2 viral copies this assay can detect is 250 copies / mL. A negative  result does not preclude SARS-CoV-2 infection and should not be used as the sole basis for treatment or other patient management decisions.  A negative result may occur with improper specimen collection / handling, submission of specimen other than nasopharyngeal swab, presence of viral mutation(s) within the areas targeted by this assay, and inadequate number of viral copies (<250 copies / mL). A negative result must be combined with clinical observations, patient history, and epidemiological information.  Fact Sheet for Patients:   StrictlyIdeas.no  Fact Sheet for Healthcare Providers: BankingDealers.co.za  This test is not yet approved or  cleared by the [place] FDA and has been authorized for detection and/or diagnosis of SARS-CoV-2 by FDA under an Emergency Use Authorization (EUA).  This EUA will remain in effect (meaning this test can be used) for the duration of the COVID-19 declaration under Section 564(b)(1) of the Act, 21 U.S.C. section 360bbb-3(b)(1), unless the authorization is terminated or revoked sooner.  Performed at [facility], [place] [address], [place], [place] [address]          Radiology Studies: DG Chest 2 View  Result Date: 01/04/2020 CLINICAL DATA:  Back pain, weakness and shortness of breath EXAM: CHEST - 2 VIEW COMPARISON:  Radiograph 12/30/2019 FINDINGS: Tunneled dual lumen dialysis catheter tip terminates at the superior cavoatrial junction, similar position to prior. Cardiomediastinal contours are unremarkable. Telemetry leads overlie the chest. Some hazy interstitial opacities are present throughout the lungs with central vascular congestion and cephalization. No visible pneumothorax or effusion. No acute osseous or soft tissue abnormality. IMPRESSION: Hazy opacities likely reflect edema/volume overload given vascular congestion and redistribution though should correlate with clinical  setting as atypical infection could present similarly. Electronically Signed   By: Lovena Le M.D.   On: 01/04/2020 15:02   MR CERVICAL SPINE WO CONTRAST  Result Date: 01/04/2020 CLINICAL DATA:  Severe diffuse back pain. Study was ordered without and contrast. None contrast imaging the cervical spine was completed. Patient refused further imaging due to pain. Study is moderately degraded by patient motion. EXAM: MRI CERVICAL SPINE WITHOUT CONTRAST TECHNIQUE: Multiplanar, multisequence MR imaging of the cervical spine was performed. No intravenous contrast was administered. COMPARISON:  MR head without contrast 12/31/2019. FINDINGS: Alignment: Slight retrolisthesis is  present at C5-6. Degenerative anterolisthesis is present T1-2. Reversal of the normal cervical lordosis is noted. Vertebrae: Decreased T1 marrow signal is present in the lower cervical spine, particularly C7 and T1. Vertebral body heights are maintained. Cord: T2 signal hyperintensity is present in the spinal cord from C3-4 through C5. Posterior Fossa, vertebral arteries, paraspinal tissues: Visualized brainstem and cerebellum are normal. Diffuse edema is present in the occipital paraspinous musculature bilaterally. No discrete abscess is present. Extensive prevertebral edema extends from the clivus to C6. No abscess is present. Mild edema is present in the posterior paraspinous muscles, left greater than right. Disc levels: a abnormal fluid present in the dorsal spinal canal extending from foramen magnum to the T2 level. Severe disc disease present at C5-6 and C6-7. The cord is compressed at these levels. Mild disc disease present at C3-4 and C4-5 with some cord compression at these levels. IMPRESSION: 1. Abnormal fluid in the dorsal spinal canal extending from foramen magnum to the T2 level. Given the other inflammatory changes and concern, this most likely represents epidural infection. There is some fluid ventral to the spinal cord at C2. 2.  Diffuse prevertebral edema from the clivus to C6 is also concerning for infection. 3. The cord is compressed without abnormal signal suggesting edema. 4. Mild edema in the posterior paraspinous muscles, left greater than right. Edema is most significant within the paraspinous muscles just below the occiput. No focal abscess is present. 5. Severe disc disease at C5-6 and C6-7 with moderate central canal stenosis at both levels. 6. Mild disc disease at C3-4 and C4-5 with some cord compression at these levels. 7. Abnormal marrow signal in the lower cervical spine, particularly C7 and T1. This is nonspecific and can be seen in the setting of anemia, smoking, or obesity. Electronically Signed   By: San Morelle M.D.   On: 01/04/2020 18:51        Scheduled Meds: . insulin aspart  0-6 Units Subcutaneous Q4H  . vancomycin variable dose per unstable renal function (pharmacist dosing)   Does not apply See admin instructions   Continuous Infusions:   LOS: 1 day   Time spent= 35 mins    Rodney Litsey Arsenio Loader, MD [facility]  If 7PM-7AM, please contact night-coverage  01/05/2020, 2:04 PM

## 2020-01-05 NOTE — Progress Notes (Signed)
Pharmacy Antibiotic Note  [name]. is a 69 y.o. male admitted on 01/04/2020 with back pain.  Pharmacy has been consulted for vancomycin dosing.  Pt presented with back pain and confusion. MRI of C-spine shows features concerning for epidural abscess. PMH significant for ESRD on HD MWF. BCID now positive for MRSA bacteremia.   Today, 01/05/20  WBC 24.1, elevated  SCr 16.74. Pt on HD MWF  Plan:  Vancomycin 2000 mg IV LD  Follow along and re-dose when necessary around HD sessions     No data recorded.  Recent Labs  Lab 12/30/19 2212 01/03/20 1524 01/04/20 1213 01/04/20 1400 01/05/20 0502  WBC 15.0* 25.8* 25.3*  --  24.1*  CREATININE 14.32* 14.34* 15.45*  --  16.74*  LATICACIDVEN  --   --   --  1.7  --     Estimated Creatinine Clearance: 4.6 mL/min (A) (by C-G formula based on SCr of 16.74 mg/dL (H)).    No Known Allergies  Antimicrobials this admission: vancomycin 8/4 >>   Dose adjustments this admission:  Microbiology results: 8/3 BCx: 4/4 MRSA 8/3 UCx: Sent  Thank you for allowing pharmacy to be a part of this patient's care.  Lenis Noon, PharmD 01/05/2020 1:03 PM

## 2020-01-05 NOTE — Progress Notes (Signed)
PHARMACY - PHYSICIAN COMMUNICATION CRITICAL VALUE ALERT - BLOOD CULTURE IDENTIFICATION (BCID)  [name]. is an 69 y.o. male who presented to [facility] on 01/04/2020 with a chief complaint of back pain, confusion, headache.  Assessment: Pt presented to the ED with worsening headache and back pain. MRI spine revealed abnormal fluid in the dorsal spinal canal. Suspected epidural infection.  Name of physician (or Provider) Contacted: Dr. Reesa Chew  Current antibiotics: No antibiotics currently ordered. Per H&P, antibiotics initially held until cultures could be obtained.   Changes to prescribed antibiotics recommended: Initiate vancomycin. Recommendation accepted.    Results for orders placed or performed during the hospital encounter of 01/04/20  Blood Culture ID Panel (Reflexed) (Collected: 01/04/2020 12:13 PM)  Result Value Ref Range   Enterococcus faecalis NOT DETECTED NOT DETECTED   Enterococcus Faecium NOT DETECTED NOT DETECTED   Listeria monocytogenes NOT DETECTED NOT DETECTED   Staphylococcus species DETECTED (A) NOT DETECTED   Staphylococcus aureus (BCID) DETECTED (A) NOT DETECTED   Staphylococcus epidermidis NOT DETECTED NOT DETECTED   Staphylococcus lugdunensis NOT DETECTED NOT DETECTED   Streptococcus species NOT DETECTED NOT DETECTED   Streptococcus agalactiae NOT DETECTED NOT DETECTED   Streptococcus pneumoniae NOT DETECTED NOT DETECTED   Streptococcus pyogenes NOT DETECTED NOT DETECTED   A.calcoaceticus-baumannii NOT DETECTED NOT DETECTED   Bacteroides fragilis NOT DETECTED NOT DETECTED   Enterobacterales NOT DETECTED NOT DETECTED   Enterobacter cloacae complex NOT DETECTED NOT DETECTED   Escherichia coli NOT DETECTED NOT DETECTED   Klebsiella aerogenes NOT DETECTED NOT DETECTED   Klebsiella oxytoca NOT DETECTED NOT DETECTED   Klebsiella pneumoniae NOT DETECTED NOT DETECTED   Proteus species NOT DETECTED NOT DETECTED   Salmonella species NOT DETECTED NOT DETECTED    Serratia marcescens NOT DETECTED NOT DETECTED   Haemophilus influenzae NOT DETECTED NOT DETECTED   Neisseria meningitidis NOT DETECTED NOT DETECTED   Pseudomonas aeruginosa NOT DETECTED NOT DETECTED   Stenotrophomonas maltophilia NOT DETECTED NOT DETECTED   Candida albicans NOT DETECTED NOT DETECTED   Candida auris NOT DETECTED NOT DETECTED   Candida glabrata NOT DETECTED NOT DETECTED   Candida krusei NOT DETECTED NOT DETECTED   Candida parapsilosis NOT DETECTED NOT DETECTED   Candida tropicalis NOT DETECTED NOT DETECTED   Cryptococcus neoformans/gattii NOT DETECTED NOT DETECTED   Meth resistant mecA/C and MREJ DETECTED (A) NOT [place], PharmD 01/05/2020  7:49 AM

## 2020-01-05 NOTE — ED Notes (Signed)
Call received from pt daughter Braidyn Scorsone [phone] requesting rtn call for pt status/updates when possible. [employer]

## 2020-01-05 NOTE — ED Notes (Signed)
PT provided blanket folded under middle back for comfort.

## 2020-01-05 NOTE — ED Notes (Signed)
Pt getting increasingly aggitated. Pts daughter was called in efforts to help assist in calming pt. Pts daughter requesting update on fathers condition from the hospitalist. Hospitalist made aware.

## 2020-01-05 NOTE — Progress Notes (Signed)
  NEUROSURGERY PROGRESS NOTE   No issues overnight. Continues diffuse spine pain. Unable to complete MRI T/L spine No new N/T/W  EXAM:  BP (!) 190/85   Pulse 89   Temp 99 F (37.2 C) (Oral)   Resp (!) 24   SpO2 94%   Awake, alert, oriented x3 Speech slow, confused at  times CN grossly intact  MAEW, decreased diffusely, but seemingly nonfocal today  IMPRESSION/PLAN 69 y.o. male bacteremic secondary to large dorsal cervicothoracic epidural abscess. I called the patient's daughter, Debroah Baller, today at patient's request. I have discussed the cervical MRI findings with her. Given extent of cervical abscess and resultant cord compression, despite the incomplete MRI studies, we feel it is necessary to proceed with C4-T2 laminectomy and fusion for decompression tomorrow. I discussed the procedure at length including risks, benefits and alternatives with both the patient and daughter. Daughter states understanding is agreeable to surgery and patient seems indifferent, although is leaning towards proceeding with surgery.   Of note, there was talk at one point about patient stopping dialysis and switching over to hospice care. If patient is going to refuse to continue diaylsis, I do not think proceeding with a large spine surgery is in his best interest.  Will call tomorrow am again and touch base and ensure we are okay moving forward as planned. NPO at midnight.  Ferne Reus, PA-C [facility] [employer]

## 2020-01-05 NOTE — ED Notes (Addendum)
PT was transferred to bed via stand and pivot assistance x2, PT placed to position of comfort and monitor was re-applied, PT daughter on phone regarding PT room number. PT bed linens were changed and clean set applied due to minor soiling.

## 2020-01-05 NOTE — ED Notes (Signed)
Pt continues to report he not going to be admitted and no one has talked to him or will tell him anything.  Staff continues to explain reasons for admission and process.  Pt still disagreeing w/ staff.

## 2020-01-05 NOTE — ED Notes (Signed)
hospitalist at bedside

## 2020-01-05 NOTE — Consult Note (Signed)
[name] Renal Consultation Note    Indication for Consultation:  Management of ESRD/hemodialysis; anemia, hypertension/volume and secondary hyperparathyroidism  HPI: [name]. is a 69 y.o. male with a PMH significant for HTN, HLD, OSA, h/o colon cancer, DM type II, and ESRD who presented to [facility] ED on 01/04/20 with complaints of back and neck pain as well as confusion.  He had been to the ED 3 times in the past week with complaints of Headache and/or pain.  In the ED he was noted to have an elevated WBC of 25.3, glucose of 233, and he was noted to have weakness in all extremities.  An MRI of his c spine was ordered and revealed an epidural abscess.  Neurosurgery was consulted and ordered MRI of his lower back as well.  He is to be admitted and transferred to [facility] for decompression and aspiration by neurosurgery.  We were consulted to provide HD during his hospitalization.  Past Medical History:  Diagnosis Date  . Anemia   . Arthritis    left knee  . Chronic kidney disease    acute renal failure/injury requiring short-term HD 2013  . Colon cancer ([place])   . Colon polyp    11/2011 - Polyps identified, biopsy - invasive adenocarinoma  . DM II (diabetes mellitus, type II), controlled ([place])    type 2 IDDM x 15 years. A1C 1/09 13.7.   Marland Kitchen 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 12/25/2011  . GERD (gastroesophageal reflux disease)   . Heart murmur   . 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 Hyperlipidemia   . Hypertension    for a few years and resolved in 2013/2014.  . Sleep apnea    does not wear CPAP now  . Wears glasses    Past Surgical History:  Procedure Laterality Date  . APPLICATION OF WOUND VAC  02/16/2012   Procedure: APPLICATION OF WOUND VAC;  Surgeon: Zenovia Jarred, MD;  Location: [place];  Service: General;  Laterality: N/A;  . APPLICATION OF WOUND VAC  02/26/2012   Procedure: APPLICATION OF WOUND VAC;  Surgeon: Imogene Burn. [name], MD;  Location: [place];   Service: General;  Laterality: N/A;  . AV FISTULA PLACEMENT Left 07/29/2019   Procedure: ARTERIOVENOUS (AV) FISTULA CREATION;  Surgeon: Marty Heck, MD;  Location: [place];  Service: Vascular;  Laterality: Left;  . COLONOSCOPY    . COLONOSCOPY  02/06/2012   Procedure: COLONOSCOPY;  Surgeon: Milus Banister, MD;  Location: [place];  Service: Endoscopy;;  . COLONOSCOPY WITH PROPOFOL N/A 06/10/2019   Procedure: COLONOSCOPY WITH PROPOFOL;  Surgeon: Milus Banister, MD;  Location: [facility];  Service: Endoscopy;  Laterality: N/A;  . COLOSTOMY REVERSAL    . DRESSING CHANGE UNDER ANESTHESIA  02/18/2012   Procedure: DRESSING CHANGE UNDER ANESTHESIA;  Surgeon: Odis Hollingshead, MD;  Location: [place];  Service: General;  Laterality: N/A;  . ESOPHAGOGASTRODUODENOSCOPY (EGD) WITH PROPOFOL N/A 06/10/2019   Procedure: ESOPHAGOGASTRODUODENOSCOPY (EGD) WITH PROPOFOL;  Surgeon: Milus Banister, MD;  [facility];  Service: Endoscopy;  Laterality: N/A;  . GANGLION CYST EXCISION  20 YRS AGO   RT ARM  . ILEOSTOMY  02/16/2012   Procedure: ILEOSTOMY;  Surgeon: Zenovia Jarred, MD;  Location: [place];  Service: General;  Laterality: N/A;  . ILEOSTOMY CLOSURE N/A 09/02/2012   Procedure: ILEOSTOMY REVERSAL;  Surgeon: Imogene Burn. [name], MD;  Location: [place];  Service: General;  Laterality: N/A;  . ILIOSTOMY    .  INCISION AND DRAINAGE OF WOUND  02/23/2012   Procedure: IRRIGATION AND DEBRIDEMENT WOUND;  Surgeon: Joyice Faster. Cornett, MD;  Location: [place];  Service: General;  Laterality: N/A;  . LAPAROTOMY  02/16/2012   Procedure: EXPLORATORY LAPAROTOMY;  Surgeon: Zenovia Jarred, MD;  [facility];  Service: General;  Laterality: N/A;  Exploratory Laparotomy with resection of anastomosis  . LAPAROTOMY  02/18/2012   Procedure: EXPLORATORY LAPAROTOMY;  Surgeon: Odis Hollingshead, MD;  Location: [place];  Service: General;  Laterality: N/A;  exploratory laparotomy  change of abdominal vac dressing  . LAPAROTOMY   02/20/2012   Procedure: EXPLORATORY LAPAROTOMY;  Surgeon: Odis Hollingshead, MD;  Location: [place];  Service: General;  Laterality: N/A;  . LAPAROTOMY  02/23/2012   Procedure: EXPLORATORY LAPAROTOMY;  Surgeon: Joyice Faster. Cornett, MD;  Location: [place];  Service: General;  Laterality: N/A;  Irrigation and Debridement of abdominal wound with wound vac change with partial closure  . LAPAROTOMY  02/26/2012   Procedure: EXPLORATORY LAPAROTOMY;  Surgeon: Imogene Burn. [name], MD;  Location: [place];  Service: General;  Laterality: N/A;     . PARTIAL COLECTOMY  02/06/2012  . PARTIAL COLECTOMY  02/06/2012   Procedure: PARTIAL COLECTOMY;  Surgeon: Imogene Burn. [name], MD;  Location: [place];  Service: General;  Laterality: N/A;  right partial colectomy  . RESECTION SMALL BOWEL / CLOSURE ILEOSTOMY  402/2014   Dr [name]  . VACUUM ASSISTED CLOSURE CHANGE  02/20/2012   Procedure: ABDOMINAL VACUUM ASSISTED CLOSURE CHANGE;  Surgeon: Odis Hollingshead, MD;  Location: [place];  Service: General;;  . VACUUM ASSISTED CLOSURE CHANGE  02/23/2012   Procedure: ABDOMINAL VACUUM ASSISTED CLOSURE CHANGE;  Surgeon: Joyice Faster. Cornett, MD;  Location: [place] [facility];  Service: General;  Laterality: N/A;   Family History:   Family History  Problem Relation Age of Onset  . Diabetes Father   . Heart disease Father   . Hypertension Mother   . Diabetes Sister   . Heart disease Sister   . Colon cancer Neg Hx    Social History:  reports that he has been smoking cigarettes. He has a 15.00 pack-year smoking history. He has never used smokeless tobacco. He reports current alcohol use. He reports current drug use. Drug: Cocaine. No Known Allergies Prior to Admission medications   Medication Sig Start Date End Date Taking? Authorizing Provider  amLODipine (NORVASC) 10 MG tablet Take 10 mg by mouth at bedtime.    Yes [provider]  atorvastatin (LIPITOR) 20 MG tablet Take 20 mg by mouth at bedtime.    Yes [provider]  betamethasone  dipropionate 0.05 % cream Apply 1 application topically 2 (two) times daily as needed (skin irritation; psoriasis).   Yes [provider]  insulin detemir (LEVEMIR) 100 UNIT/ML injection Inject 0.24 mLs (24 Units total) into the skin at bedtime. Patient taking differently: Inject 26 Units into the skin See admin instructions. Per sliding scale: Most is 22 units and least is 12 units. 12/08/13  Yes Boggala, Gaynelle Adu, MD  lidocaine-prilocaine (EMLA) cream Apply 1 application topically once. Apply to needle site 1 hr. Prior to cannulation for hemodialysis.   Yes [provider]  metoprolol tartrate (LOPRESSOR) 50 MG tablet Take 50 mg by mouth 2 (two) times daily.    Yes [provider]  multivitamin (RENA-VIT) TABS tablet Take 1 tablet by mouth daily.   Yes [provider]  carbamazepine (TEGRETOL) 200 MG tablet Take 1 tablet (200  mg total) by mouth daily. Patient not taking: Reported on 01/04/2020 12/31/19 01/30/20  Rodell Perna A, PA-C  HYDROcodone-acetaminophen (NORCO/VICODIN) 5-325 MG tablet Take 1 tablet by mouth every 4 (four) hours as needed for moderate pain. Patient not taking: Reported on 12/31/2019 07/29/19 07/28/20  Barbie Banner, PA-C   Current Facility-Administered Medications  Medication Dose Route Frequency Provider Last Rate Last Admin  . hydrALAZINE (APRESOLINE) injection 10 mg  10 mg Intravenous Q4H PRN Rise Patience, MD      . insulin aspart (novoLOG) injection 0-6 Units  0-6 Units Subcutaneous Q4H Rise Patience, MD   1 Units at 01/05/20 [id number]  . ondansetron (ZOFRAN) tablet 4 mg  4 mg Oral Q6H PRN Rise Patience, MD       Or  . ondansetron [facility]) injection 4 mg  4 mg Intravenous Q6H PRN Rise Patience, MD   4 mg at 01/04/20 2256  . vancomycin variable dose per unstable renal function (pharmacist dosing)   Does not apply See admin instructions Lenis Noon, [facility]       Current Outpatient Medications  Medication Sig  Dispense Refill  . amLODipine (NORVASC) 10 MG tablet Take 10 mg by mouth at bedtime.     Marland Kitchen atorvastatin (LIPITOR) 20 MG tablet Take 20 mg by mouth at bedtime.     . betamethasone dipropionate 0.05 % cream Apply 1 application topically 2 (two) times daily as needed (skin irritation; psoriasis).    . insulin detemir (LEVEMIR) 100 UNIT/ML injection Inject 0.24 mLs (24 Units total) into the skin at bedtime. (Patient taking differently: Inject 26 Units into the skin See admin instructions. Per sliding scale: Most is 22 units and least is 12 units.) 10 mL 11  . lidocaine-prilocaine (EMLA) cream Apply 1 application topically once. Apply to needle site 1 hr. Prior to cannulation for hemodialysis.    Marland Kitchen metoprolol tartrate (LOPRESSOR) 50 MG tablet Take 50 mg by mouth 2 (two) times daily.     . multivitamin (RENA-VIT) TABS tablet Take 1 tablet by mouth daily.    . carbamazepine (TEGRETOL) 200 MG tablet Take 1 tablet (200 mg total) by mouth daily. (Patient not taking: Reported on 01/04/2020) 30 tablet 0  . HYDROcodone-acetaminophen (NORCO/VICODIN) 5-325 MG tablet Take 1 tablet by mouth every 4 (four) hours as needed for moderate pain. (Patient not taking: Reported on 12/31/2019) 10 tablet 0   Labs: Basic Metabolic Panel: Recent Labs  Lab 01/03/20 1524 01/04/20 1213 01/05/20 0502  NA 132* 131* 136  K 3.8 4.4 4.5  CL 93* 90* 94*  CO2 23 23 22   GLUCOSE 175* 233* 192*  BUN 87* 96* 70*  CREATININE 14.34* 15.45* 16.74*  CALCIUM 8.5* 8.7* 8.6*   Liver Function Tests: Recent Labs  Lab 01/04/20 1213  AST 21  ALT 20  ALKPHOS 84  BILITOT 1.1  PROT 9.1*  ALBUMIN 3.2*   No results for input(s): LIPASE, AMYLASE in the last 168 hours. No results for input(s): AMMONIA in the last 168 hours. CBC: Recent Labs  Lab 12/30/19 2212 12/30/19 2212 01/03/20 1524 01/04/20 1213 01/05/20 0502  WBC 15.0*   < > 25.8* 25.3* 24.1*  NEUTROABS  --   --   --  22.5*  --   HGB 8.6*   < > 9.5* 10.0* 8.8*  HCT 25.6*    < > 27.4* 28.6* 25.3*  MCV 76.9*  --  74.1* 75.5* 74.4*  PLT 180   < > 276 333 266   < > =  values in this interval not displayed.   Cardiac Enzymes: No results for input(s): CKTOTAL, CKMB, CKMBINDEX, TROPONINI in the last 168 hours. CBG: Recent Labs  Lab 01/04/20 1416 01/05/20 0017 01/05/20 0333 01/05/20 0719 01/05/20 1231  GLUCAP 192* 154* 149* 172* 169*   Iron Studies: No results for input(s): IRON, TIBC, TRANSFERRIN, FERRITIN in the last 72 hours. Studies/Results: DG Chest 2 View  Result Date: 01/04/2020 CLINICAL DATA:  Back pain, weakness and shortness of breath EXAM: CHEST - 2 VIEW COMPARISON:  Radiograph 12/30/2019 FINDINGS: Tunneled dual lumen dialysis catheter tip terminates at the superior cavoatrial junction, similar position to prior. Cardiomediastinal contours are unremarkable. Telemetry leads overlie the chest. Some hazy interstitial opacities are present throughout the lungs with central vascular congestion and cephalization. No visible pneumothorax or effusion. No acute osseous or soft tissue abnormality. IMPRESSION: Hazy opacities likely reflect edema/volume overload given vascular congestion and redistribution though should correlate with clinical setting as atypical infection could present similarly. Electronically Signed   By: Lovena Le M.D.   On: 01/04/2020 15:02   MR CERVICAL SPINE WO CONTRAST  Result Date: 01/04/2020 CLINICAL DATA:  [name] diffuse back pain. Study was ordered without and contrast. None contrast imaging the cervical spine was completed. Patient refused further imaging due to pain. Study is moderately degraded by patient motion. EXAM: MRI CERVICAL SPINE WITHOUT CONTRAST TECHNIQUE: Multiplanar, multisequence MR imaging of the cervical spine was performed. No intravenous contrast was administered. COMPARISON:  MR head without contrast 12/31/2019. FINDINGS: Alignment: Slight retrolisthesis is present at C5-6. Degenerative anterolisthesis is present T1-2.  Reversal of the normal cervical lordosis is noted. Vertebrae: Decreased T1 marrow signal is present in the lower cervical spine, particularly C7 and T1. Vertebral body heights are maintained. Cord: T2 signal hyperintensity is present in the spinal cord from C3-4 through C5. Posterior Fossa, vertebral arteries, paraspinal tissues: Visualized brainstem and cerebellum are normal. Diffuse edema is present in the occipital paraspinous musculature bilaterally. No discrete abscess is present. Extensive prevertebral edema extends from the clivus to C6. No abscess is present. Mild edema is present in the posterior paraspinous muscles, left greater than right. Disc levels: a abnormal fluid present in the dorsal spinal canal extending from foramen magnum to the T2 level. [name] disc disease present at C5-6 and C6-7. The cord is compressed at these levels. Mild disc disease present at C3-4 and C4-5 with some cord compression at these levels. IMPRESSION: 1. Abnormal fluid in the dorsal spinal canal extending from foramen magnum to the T2 level. Given the other inflammatory changes and concern, this most likely represents epidural infection. There is some fluid ventral to the spinal cord at C2. 2. Diffuse prevertebral edema from the clivus to C6 is also concerning for infection. 3. The cord is compressed without abnormal signal suggesting edema. 4. Mild edema in the posterior paraspinous muscles, left greater than right. Edema is most significant within the paraspinous muscles just below the occiput. No focal abscess is present. 5. [name] disc disease at C5-6 and C6-7 with moderate central canal stenosis at both levels. 6. Mild disc disease at C3-4 and C4-5 with some cord compression at these levels. 7. Abnormal marrow signal in the lower cervical spine, particularly C7 and T1. This is nonspecific and can be seen in the setting of anemia, smoking, or obesity. Electronically Signed   By: San Morelle M.D.   On: 01/04/2020  18:51    ROS: A comprehensive review of systems was negative except for: Constitutional: positive for anorexia,  fatigue and malaise Musculoskeletal: positive for back pain, muscle weakness and neck pain Physical Exam: Vitals:   01/05/20 0916 01/05/20 1000 01/05/20 1045 01/05/20 1311  BP: (!) 196/80 (!) 158/88 (!) 177/76 (!) 169/84  Pulse: (!) 102 (!) 103 100 (!) 103  Resp: 20 18 18  (!) 24  Temp:    99 F (37.2 C)  TempSrc:    Oral  SpO2: 94% 98% 100% 100%      Weight change:   Intake/Output Summary (Last 24 hours) at 01/05/2020 1333 Last data filed at 01/05/2020 1053 Gross per 24 hour  Intake 400 ml  Output 150 ml  Net 250 ml   BP (!) 169/84 (BP Location: Right Arm)   Pulse (!) 103   Temp 99 F (37.2 C) (Oral)   Resp (!) 24   SpO2 100%  General appearance: distracted, fatigued, mild distress and slowed mentation Head: Normocephalic, without obvious abnormality, atraumatic Resp: clear to auscultation bilaterally Cardio: tachycardic at 102, no rub GI: soft, non-tender; bowel sounds normal; no masses,  no organomegaly Extremities: extremities normal, atraumatic, no cyanosis or edema and LUE AVF +T/B Dialysis Access:  Dialysis Orders: Center: [facility]  on MWF . EDW 99 kg HD Bath 2K/2Ca  Time 4.25 hrs Heparin none. Access RIJ TDC and LUE AVF BFR 400 DFR 800    Calcitriol 1.25 mcg po/HD  Micera 150 mcg IV q 2 weeks last given on 12/29/19    Assessment/Plan: 1.  Epidural abscess- cannot undergo aspiration by IR so will transfer to [facility] for neurosurgical procedure.  Will also need to image lumbar and thoracic spine given his weakness of all 4 extremities and complaints of back pain.  Unclear etiology of epidural abscess and may need to remove TDC and perform TTE then TEE to rule out endocarditis.  Antibiotics on hold until procedure per primary.  Would also recommend ID consult. 2. Weakness of all 4 extremities- has epidural abscess with cervial stenosis and likely something involving  his thoracic or lumbar spine as well.  For surgical decompression and drainage of his cervical abscess by neurosurgery.  Will need to image his thoracic and lumbar spine as well with MRI. 3.  ESRD -  Will continue with HD during his hospitalization but will likely not get HD until tomorrow as he is still at [facility] and is pending transfer to [facility].  He will also require surgery as soon as he arrives here at [facility] 4.  Hypertension/volume  - stable 5.  Anemia  - will continue to follow h/h and transfuse prn.  Continue with ESA 6.  Metabolic bone disease -   Continue with home meds 7.  Nutrition -  Npo for now pending surgery. 8. Vascular access- will try to use LAVF and if successful will d/c [facility] as a possible source of infection.   Donetta Potts, MD [place] Pager [phone] 01/05/2020, 1:33 PM

## 2020-01-05 NOTE — ED Notes (Signed)
Family at bedside. 

## 2020-01-05 NOTE — Progress Notes (Signed)
Patient ID: [name]., male   DOB: 01-Oct-1950, 69 y.o.   MRN: [id number] Per Dr. Wallie Renshaw attending) there are no plans for image guided aspiration of pt's epidural abscess due to location. Neurosurgical f/u advised.  If pt TBA would advise transfer to [facility].

## 2020-01-05 NOTE — ED Notes (Signed)
Offered pt a bed bath and full lien change/ Pt refused and stated he didn't appreciate me offering him a bath.

## 2020-01-05 NOTE — ED Notes (Signed)
Pt is very agitated.  Refusing to allow staff to place pulse ox.  Pt sts his blood sugar is low, but CBG 149 (@0344 ).  Pt stating he will not be admitted until he speaks to his daughters.  It was reported to this RN that the Pt and staff have spoken to his daughters several times.

## 2020-01-05 NOTE — Consult Note (Signed)
[facility]    Date of Admission:  01/04/2020           Day 1 vancomycin       Reason for Consult: Automatic consultation for MRSA bacteremia     Assessment: He has MRSA bacteremia complicated by spinal epidural abscess.  I have ordered repeat blood cultures and TTE result is pending.  The plan is for transfer to [facility] for probable neurosurgery when a bed is available.  Plan: 1. Continue vancomycin 2. Await results of repeat blood cultures and TTE  Principal Problem:   MRSA bacteremia Active Problems:   Epidural abscess   Acute encephalopathy   Sepsis ([place])   TOBACCO ABUSE   OBSTRUCTIVE SLEEP APNEA   Colon cancer, status Post resection    HYPERTENSION   ESRD (end stage renal disease) (HCC)   Scheduled Meds: . amLODipine  10 mg Oral QHS  . atorvastatin  20 mg Oral QHS  . insulin aspart  0-6 Units Subcutaneous Q4H  . metoprolol tartrate  50 mg Oral BID  . vancomycin variable dose per unstable renal function (pharmacist dosing)   Does not apply See admin instructions   Continuous Infusions: PRN Meds:.hydrALAZINE, ondansetron **OR** ondansetron (ZOFRAN) IV, polyethylene glycol, senna-docusate  HPI: [name]. is a 69 y.o. male with end-stage renal disease on hemodialysis via an AV graft who came to the ED on 12/30/2019 complaining of sudden onset of left-sided headache.  CT scan was unrevealing and he was discharged on therapy for presumed trigeminal neuralgia.  He returned yesterday planing of continued headache, new back pain and left arm weakness.  MR I of the cervical spine showed dorsal epidural abscess from the foramen magnum to the T2 level and some prevertebral fluid with paraspinous inflammation.  Admission blood cultures are growing MRSA.   Review of Systems: Review of Systems  Unable to perform ROS: Mental acuity    Past Medical History:  Diagnosis Date  . Anemia   . Arthritis    left knee  . Chronic kidney  disease    acute renal failure/injury requiring short-term HD 2013  . Colon cancer ([place])   . Colon polyp    11/2011 - Polyps identified, biopsy - invasive adenocarinoma  . DM II (diabetes mellitus, type II), controlled ([place])    type 2 IDDM x 15 years. A1C 1/09 13.7.   Marland Kitchen 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 12/25/2011  . GERD (gastroesophageal reflux disease)   . Heart murmur   . 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 Hyperlipidemia   . Hypertension    for a few years and resolved in 2013/2014.  . Sleep apnea    does not wear CPAP now  . Wears glasses     Social History   Tobacco Use  . Smoking status: Current Every Day Smoker    Packs/day: 0.50    Years: 30.00    Pack years: 15.00    Types: Cigarettes  . Smokeless tobacco: Never Used  Substance Use Topics  . Alcohol use: Yes    Comment: Liquor twice monthly.  . Drug use: Yes    Types: Cocaine    Comment: Crack Cocaine used last night (01/03/20)    Family History  Problem Relation Age of Onset  . Diabetes Father   . Heart disease Father   . Hypertension Mother   . Diabetes Sister   . Heart disease Sister   . Colon  cancer Neg Hx    No Known Allergies  OBJECTIVE: Blood pressure (!) 169/84, pulse (!) 103, temperature 99 F (37.2 C), temperature source Oral, resp. rate (!) 24, SpO2 100 %.  Physical Exam Constitutional:      Comments: He is confused and intermittently agitated.  He will try to answer some questions and then drifts off to sleep.  Cardiovascular:     Rate and Rhythm: Normal rate and regular rhythm.     Heart sounds: No murmur heard.      Comments: Very distant heart sounds. Pulmonary:     Effort: Pulmonary effort is normal.     Breath sounds: Normal breath sounds.  Abdominal:     Palpations: Abdomen is soft.     Tenderness: There is no abdominal tenderness.  Musculoskeletal:     Comments: Left arm AV graft appears normal.  Skin:    Findings: No rash.  Neurological:     Comments: He is  moving all extremities.  He seems to have upper extremity tremors with movement.     Lab Results Lab Results  Component Value Date   WBC 24.1 (H) 01/05/2020   HGB 8.8 (L) 01/05/2020   HCT 25.3 (L) 01/05/2020   MCV 74.4 (L) 01/05/2020   PLT 266 01/05/2020    Lab Results  Component Value Date   CREATININE 16.74 (H) 01/05/2020   BUN 70 (H) 01/05/2020   NA 136 01/05/2020   K 4.5 01/05/2020   CL 94 (L) 01/05/2020   CO2 22 01/05/2020    Lab Results  Component Value Date   ALT 20 01/04/2020   AST 21 01/04/2020   ALKPHOS 84 01/04/2020   BILITOT 1.1 01/04/2020     Microbiology: Recent Results (from the past 240 hour(s))  Culture, blood (routine x 2)     Status: None (Preliminary result)   Collection Time: 01/04/20 12:13 PM   Specimen: BLOOD RIGHT HAND  Result Value Ref Range Status   Specimen Description   Final    BLOOD RIGHT HAND Performed at [facility], [place] [address] [place], [place] [address]    Special Requests   Final    BOTTLES DRAWN AEROBIC AND ANAEROBIC Blood Culture adequate volume Performed at [facility] [address] [place], [place] [address]    Culture  Setup Time   Final    GRAM POSITIVE COCCI IN BOTH AEROBIC AND ANAEROBIC BOTTLES CRITICAL VALUE NOTED.  VALUE IS CONSISTENT WITH PREVIOUSLY REPORTED AND CALLED VALUE.    Culture   Final    NO GROWTH < 24 HOURS Performed at [facility], [place] [address] [place], [place] [address]    Report Status PENDING  Incomplete  Culture, blood (routine x 2)     Status: None (Preliminary result)   Collection Time: 01/04/20 12:13 PM   Specimen: Site Not Specified; Blood  Result Value Ref Range Status   Specimen Description   Final    SITE NOT SPECIFIED Performed at [facility] [address] [place], [place] [address]    Special Requests   Final    BOTTLES DRAWN AEROBIC AND ANAEROBIC Blood Culture adequate volume Performed at [facility] [address], [place], [name] [address]    Culture  Setup Time   Final    GRAM POSITIVE COCCI IN BOTH AEROBIC AND ANAEROBIC BOTTLES Organism ID to follow CRITICAL RESULT CALLED TO, READ BACK BY AND VERIFIED WITH: D. WOFFORD PHARMD, AT  0745 01/05/20 BY Rush Landmark Performed at [facility], [place] [address] [place], [place] [address]    Culture GRAM POSITIVE COCCI  Final   Report Status PENDING  Incomplete  Blood Culture ID Panel (Reflexed)     Status: Abnormal   Collection Time: 01/04/20 12:13 PM  Result Value Ref Range Status   Enterococcus faecalis NOT DETECTED NOT DETECTED Final   Enterococcus Faecium NOT DETECTED NOT DETECTED Final   Listeria monocytogenes NOT DETECTED NOT DETECTED Final   Staphylococcus species DETECTED (A) NOT DETECTED Final    Comment: CRITICAL RESULT CALLED TO, READ BACK BY AND VERIFIED WITH: D. WOFFORD PHARMD, AT 0745 01/05/20 BY D. VANHOOK    Staphylococcus aureus (BCID) DETECTED (A) NOT DETECTED Final    Comment: Methicillin (oxacillin)-resistant Staphylococcus aureus (MRSA). MRSA is predictably resistant to beta-lactam antibiotics (except ceftaroline). Preferred therapy is vancomycin unless clinically contraindicated. Patient requires contact precautions if  hospitalized. CRITICAL RESULT CALLED TO, READ BACK BY AND VERIFIED WITH: D. WOFFORD PHARMD, AT 0745 01/05/20 BY D. VANHOOK    Staphylococcus epidermidis NOT DETECTED NOT DETECTED Final   Staphylococcus lugdunensis NOT DETECTED NOT DETECTED Final   Streptococcus species NOT DETECTED NOT DETECTED Final   Streptococcus agalactiae NOT DETECTED NOT DETECTED Final   Streptococcus pneumoniae NOT DETECTED NOT DETECTED Final   Streptococcus pyogenes NOT DETECTED NOT DETECTED Final   A.calcoaceticus-baumannii NOT DETECTED NOT DETECTED Final   Bacteroides fragilis NOT DETECTED NOT DETECTED Final   Enterobacterales NOT DETECTED NOT DETECTED Final   Enterobacter cloacae complex NOT  DETECTED NOT DETECTED Final   Escherichia coli NOT DETECTED NOT DETECTED Final   Klebsiella aerogenes NOT DETECTED NOT DETECTED Final   Klebsiella oxytoca NOT DETECTED NOT DETECTED Final   Klebsiella pneumoniae NOT DETECTED NOT DETECTED Final   Proteus species NOT DETECTED NOT DETECTED Final   Salmonella species NOT DETECTED NOT DETECTED Final   Serratia marcescens NOT DETECTED NOT DETECTED Final   Haemophilus influenzae NOT DETECTED NOT DETECTED Final   Neisseria meningitidis NOT DETECTED NOT DETECTED Final   Pseudomonas aeruginosa NOT DETECTED NOT DETECTED Final   Stenotrophomonas maltophilia NOT DETECTED NOT DETECTED Final   Candida albicans NOT DETECTED NOT DETECTED Final   Candida auris NOT DETECTED NOT DETECTED Final   Candida glabrata NOT DETECTED NOT DETECTED Final   Candida krusei NOT DETECTED NOT DETECTED Final   Candida parapsilosis NOT DETECTED NOT DETECTED Final   Candida tropicalis NOT DETECTED NOT DETECTED Final   Cryptococcus neoformans/gattii NOT DETECTED NOT DETECTED Final   Meth resistant mecA/C and MREJ DETECTED (A) NOT DETECTED Final    Comment: CRITICAL RESULT CALLED TO, READ BACK BY AND VERIFIED WITH: D. WOFFORD PHARMD, AT 0745 01/05/20 BY Rush Landmark Performed at [facility], [address], [place], [place] [address]   SARS Coronavirus 2 by RT PCR (hospital order, performed in [facility] hospital lab) Nasopharyngeal Nasopharyngeal Swab     Status: None   Collection Time: 01/04/20  2:45 PM   Specimen: Nasopharyngeal Swab  Result Value Ref Range Status   SARS Coronavirus 2 NEGATIVE NEGATIVE Final    Comment: (NOTE) SARS-CoV-2 target nucleic acids are NOT DETECTED.  The SARS-CoV-2 RNA is generally detectable in upper and lower respiratory specimens during the acute phase of infection. The lowest concentration of SARS-CoV-2 viral copies this assay can detect is 250 copies / mL. A negative result does not preclude SARS-CoV-2 infection and should not be  used as the sole basis for treatment  or other patient management decisions.  A negative result may occur with improper specimen collection / handling, submission of specimen other than nasopharyngeal swab, presence of viral mutation(s) within the areas targeted by this assay, and inadequate number of viral copies (<250 copies / mL). A negative result must be combined with clinical observations, patient history, and epidemiological information.  Fact Sheet for Patients:   StrictlyIdeas.no  Fact Sheet for Healthcare Providers: BankingDealers.co.za  This test is not yet approved or  cleared by the [place] FDA and has been authorized for detection and/or diagnosis of SARS-CoV-2 by FDA under an Emergency Use Authorization (EUA).  This EUA will remain in effect (meaning this test can be used) for the duration of the COVID-19 declaration under Section 564(b)(1) of the Act, 21 U.S.C. section 360bbb-3(b)(1), unless the authorization is terminated or revoked sooner.  Performed at [facility], [place] [address] [place], [place] [address]     Michel Bickers, [place] [employer] [phone] pager   [phone] cell 01/05/2020, 3:07 PM

## 2020-01-06 ENCOUNTER — Inpatient Hospital Stay (HOSPITAL_COMMUNITY): Payer: No Typology Code available for payment source | Admitting: Certified Registered Nurse Anesthetist

## 2020-01-06 ENCOUNTER — Inpatient Hospital Stay (HOSPITAL_COMMUNITY): Payer: No Typology Code available for payment source

## 2020-01-06 ENCOUNTER — Encounter (HOSPITAL_COMMUNITY): Payer: Self-pay | Admitting: Internal Medicine

## 2020-01-06 ENCOUNTER — Other Ambulatory Visit (HOSPITAL_COMMUNITY): Payer: No Typology Code available for payment source

## 2020-01-06 ENCOUNTER — Encounter (HOSPITAL_COMMUNITY)
Admission: EM | Disposition: A | Discharge status: Skilled Nursing Facility | Payer: Self-pay | Source: Home / Self Care | Attending: Internal Medicine

## 2020-01-06 DIAGNOSIS — N186 End stage renal disease: Secondary | ICD-10-CM

## 2020-01-06 DIAGNOSIS — I10 Essential (primary) hypertension: Secondary | ICD-10-CM

## 2020-01-06 DIAGNOSIS — A4102 Sepsis due to Methicillin resistant Staphylococcus aureus: Secondary | ICD-10-CM

## 2020-01-06 DIAGNOSIS — G062 Extradural and subdural abscess, unspecified: Secondary | ICD-10-CM | POA: Diagnosis not present

## 2020-01-06 DIAGNOSIS — T827XXD Infection and inflammatory reaction due to other cardiac and vascular devices, implants and grafts, subsequent encounter: Secondary | ICD-10-CM

## 2020-01-06 DIAGNOSIS — R7881 Bacteremia: Secondary | ICD-10-CM | POA: Diagnosis not present

## 2020-01-06 HISTORY — PX: POSTERIOR CERVICAL FUSION/FORAMINOTOMY: SHX5038

## 2020-01-06 LAB — POCT I-STAT 7, (LYTES, BLD GAS, ICA,H+H)
Acid-base deficit: 2 mmol/L (ref 0.0–2.0)
Acid-base deficit: 3 mmol/L — ABNORMAL HIGH (ref 0.0–2.0)
Bicarbonate: 22.6 mmol/L (ref 20.0–28.0)
Bicarbonate: 22 mmol/L (ref 20.0–28.0)
Calcium, Ion: 1 mmol/L — ABNORMAL LOW (ref 1.15–1.40)
Calcium, Ion: 0.96 mmol/L — ABNORMAL LOW (ref 1.15–1.40)
HCT: 22 % — ABNORMAL LOW (ref 39.0–52.0)
HCT: 27 % — ABNORMAL LOW (ref 39.0–52.0)
Hemoglobin: 7.5 g/dL — ABNORMAL LOW (ref 13.0–17.0)
Hemoglobin: 9.2 g/dL — ABNORMAL LOW (ref 13.0–17.0)
O2 Saturation: 100 %
O2 Saturation: 100 %
Potassium: 5.3 mmol/L — ABNORMAL HIGH (ref 3.5–5.1)
Potassium: 6 mmol/L — ABNORMAL HIGH (ref 3.5–5.1)
Sodium: 136 mmol/L (ref 135–145)
Sodium: 136 mmol/L (ref 135–145)
TCO2: 24 mmol/L (ref 22–32)
TCO2: 23 mmol/L (ref 22–32)
pCO2 arterial: 36.9 mmHg (ref 32.0–48.0)
pCO2 arterial: 38 mmHg (ref 32.0–48.0)
pH, Arterial: 7.395 (ref 7.350–7.450)
pH, Arterial: 7.37 (ref 7.350–7.450)
pO2, Arterial: 191 mmHg — ABNORMAL HIGH (ref 83.0–108.0)
pO2, Arterial: 182 mmHg — ABNORMAL HIGH (ref 83.0–108.0)

## 2020-01-06 LAB — CBC
HCT: 24.9 % — ABNORMAL LOW (ref 39.0–52.0)
Hemoglobin: 8.7 g/dL — ABNORMAL LOW (ref 13.0–17.0)
MCH: 25.7 pg — ABNORMAL LOW (ref 26.0–34.0)
MCHC: 34.9 g/dL (ref 30.0–36.0)
MCV: 73.5 fL — ABNORMAL LOW (ref 80.0–100.0)
Platelets: 290 10*3/uL (ref 150–400)
RBC: 3.39 MIL/uL — ABNORMAL LOW (ref 4.22–5.81)
RDW: 17 % — ABNORMAL HIGH (ref 11.5–15.5)
WBC: 25.8 10*3/uL — ABNORMAL HIGH (ref 4.0–10.5)
nRBC: 0 % (ref 0.0–0.2)

## 2020-01-06 LAB — GLUCOSE, CAPILLARY
Glucose-Capillary: 160 mg/dL — ABNORMAL HIGH (ref 70–99)
Glucose-Capillary: 171 mg/dL — ABNORMAL HIGH (ref 70–99)
Glucose-Capillary: 197 mg/dL — ABNORMAL HIGH (ref 70–99)

## 2020-01-06 LAB — BASIC METABOLIC PANEL
Anion gap: 21 — ABNORMAL HIGH (ref 5–15)
BUN: 86 mg/dL — ABNORMAL HIGH (ref 8–23)
CO2: 20 mmol/L — ABNORMAL LOW (ref 22–32)
Calcium: 8.3 mg/dL — ABNORMAL LOW (ref 8.9–10.3)
Chloride: 96 mmol/L — ABNORMAL LOW (ref 98–111)
Creatinine, Ser: 12.57 mg/dL — ABNORMAL HIGH (ref 0.61–1.24)
GFR calc Af Amer: 4 mL/min — ABNORMAL LOW (ref >60)
GFR calc non Af Amer: 4 mL/min — ABNORMAL LOW (ref >60)
Glucose, Bld: 144 mg/dL — ABNORMAL HIGH (ref 70–99)
Potassium: 4.2 mmol/L (ref 3.5–5.1)
Sodium: 137 mmol/L (ref 135–145)

## 2020-01-06 LAB — PREPARE RBC (CROSSMATCH)

## 2020-01-06 LAB — MAGNESIUM: Magnesium: 2 mg/dL (ref 1.7–2.4)

## 2020-01-06 LAB — HEPATITIS B SURFACE ANTIGEN: Hepatitis B Surface Ag: NONREACTIVE

## 2020-01-06 IMAGING — RF DG CERVICAL SPINE 2 OR 3 VIEWS
1 series · 2 of 2 positions shown · non-contrast
Comparison: None.

CLINICAL DATA: Cervicothoracic laminectomy infusion

EXAM:
CERVICAL SPINE - 2-3 VIEW; DG C-ARM 1-60 MIN

[Series 1: run · 2 of 2 slices shown]
[im 1/2]
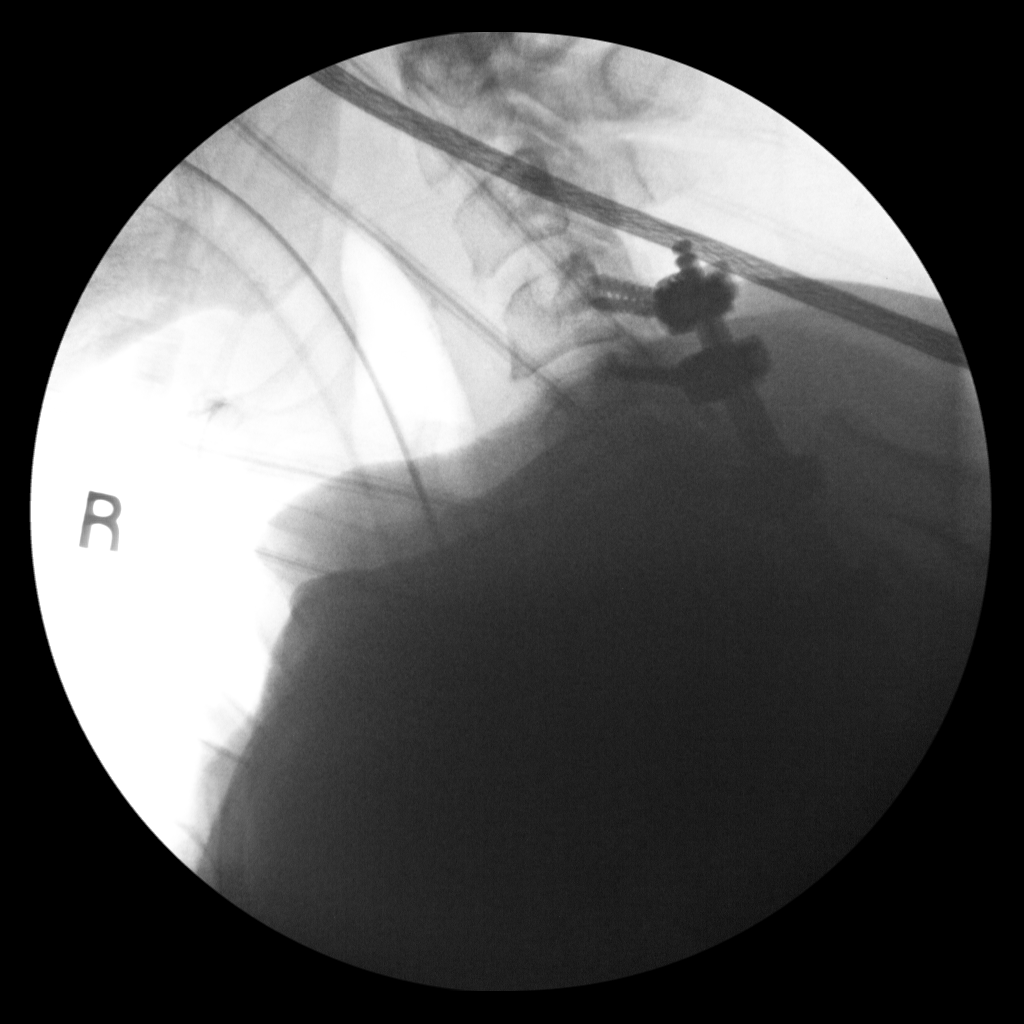
[im 2/2]
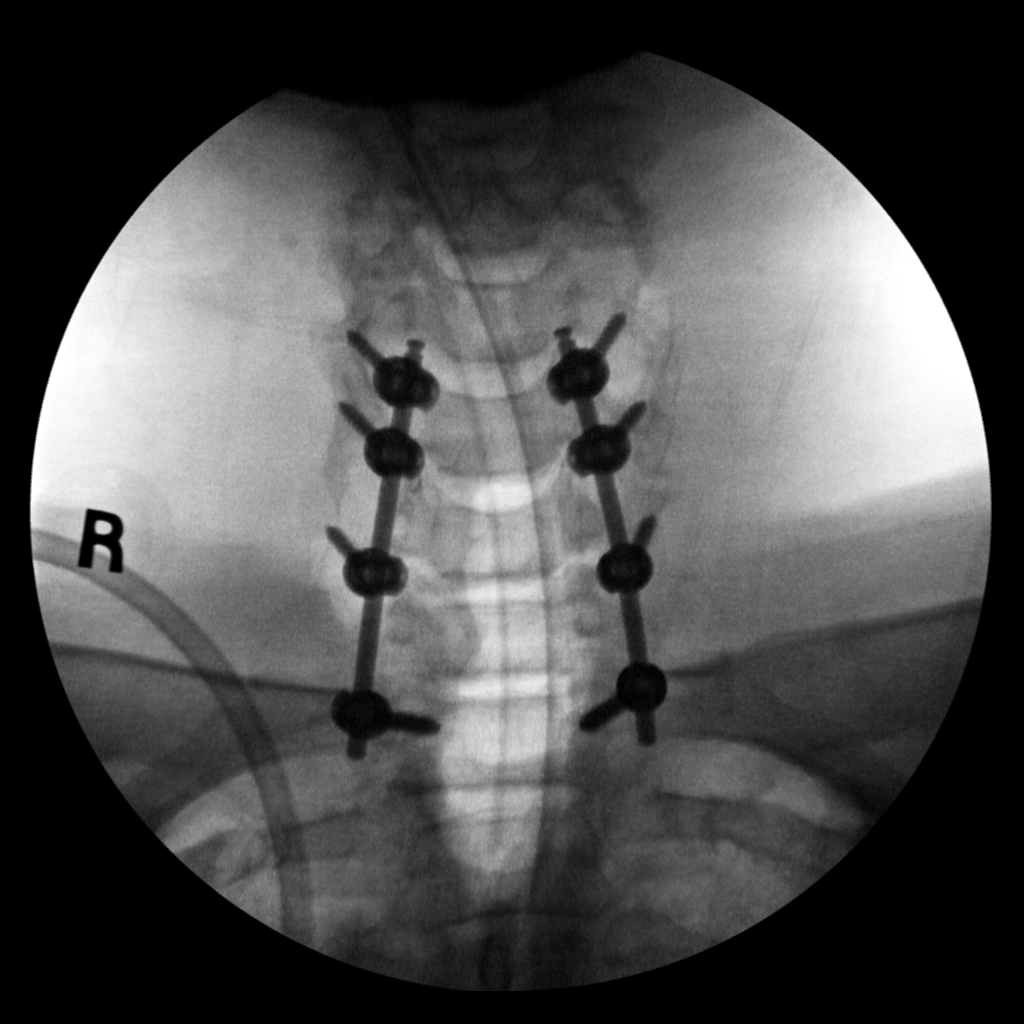

[2 of 2 positions shown; findings below may reference images not displayed]

FLUOROSCOPY TIME:  Fluoroscopy Time:  10.8 seconds

Radiation Exposure Index (if provided by the fluoroscopic device):
2.51 mGy

Number of Acquired Spot Images: 2
FINDINGS: Posterior fusion at C4-T1 is noted. The inferior aspect is
incompletely evaluated on the lateral film.
IMPRESSION: Cervicothoracic fusion.

## 2020-01-06 IMAGING — RF DG C-ARM 1-60 MIN
1 series · 2 of 2 positions shown · non-contrast
Comparison: None.

CLINICAL DATA: Cervicothoracic laminectomy infusion

EXAM:
CERVICAL SPINE - 2-3 VIEW; DG C-ARM 1-60 MIN

[Series 1: run · 2 of 2 slices shown]
[im 1/2]
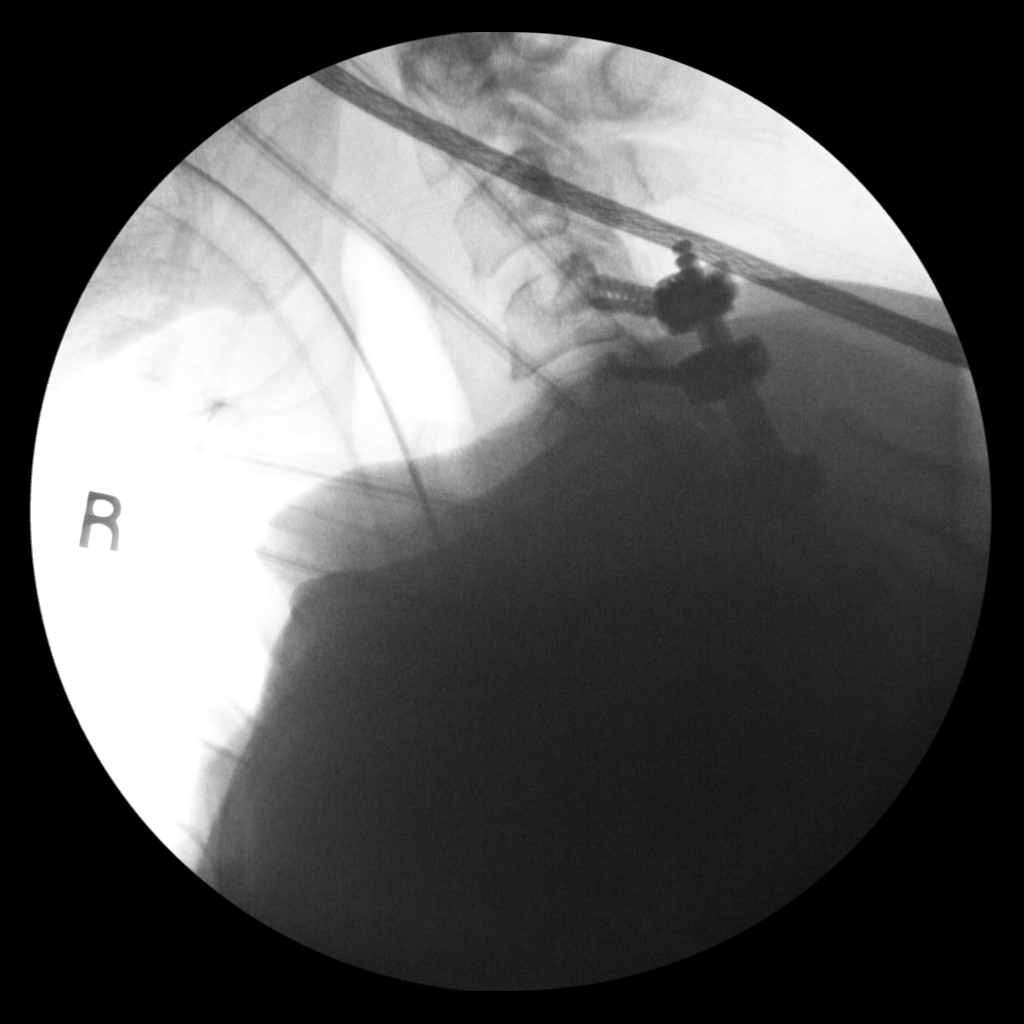
[im 2/2]
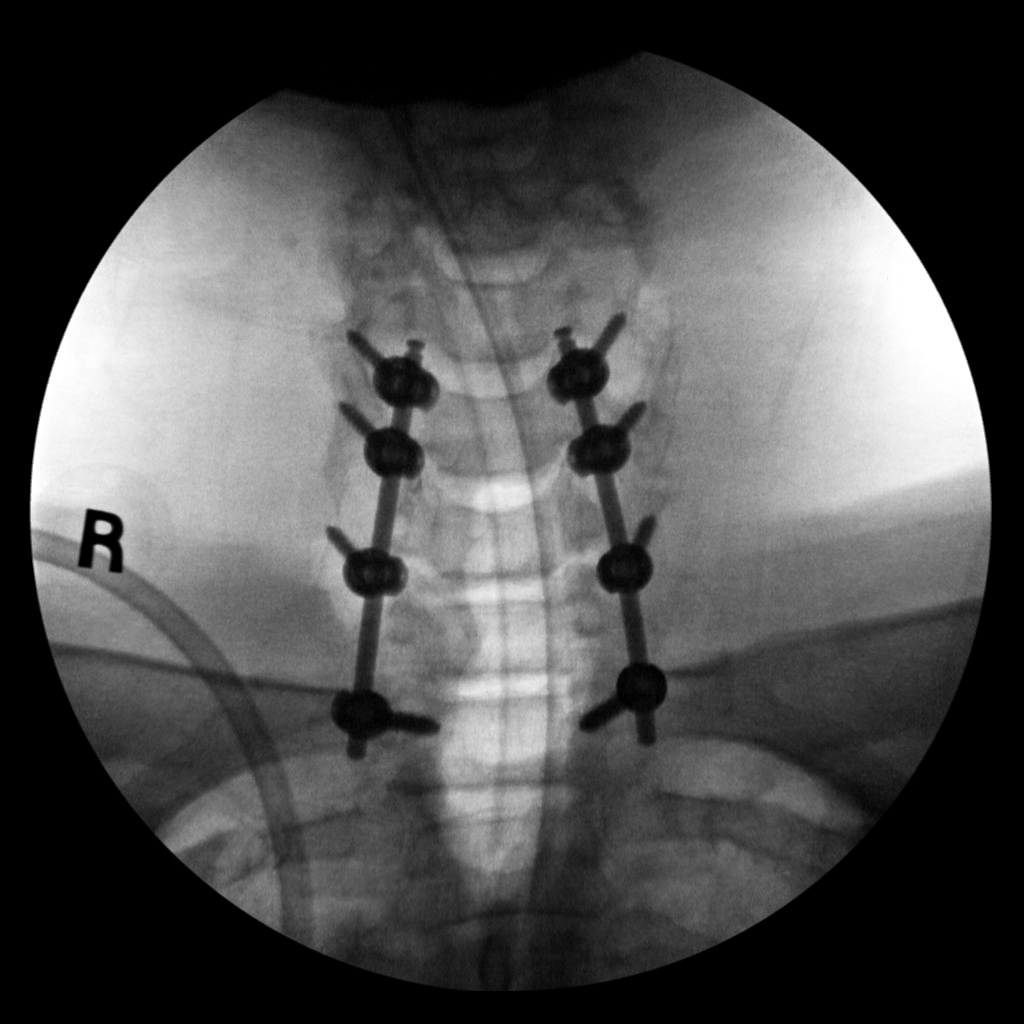

[2 of 2 positions shown; findings below may reference images not displayed]

FLUOROSCOPY TIME:  Fluoroscopy Time:  10.8 seconds

Radiation Exposure Index (if provided by the fluoroscopic device):
2.51 mGy

Number of Acquired Spot Images: 2
FINDINGS: Posterior fusion at C4-T1 is noted. The inferior aspect is
incompletely evaluated on the lateral film.
IMPRESSION: Cervicothoracic fusion.

## 2020-01-06 IMAGING — MR MR LUMBAR SPINE W/O CM
4 of 6 series · 17 of 48 positions shown · non-contrast
Comparison: Cervical study done 2 days ago. Thoracic study done
yesterday.

CLINICAL DATA: In stage renal disease patient on hemodialysis.
Worsening headache and back pain. Assess for evidence of infection.
Cervicothoracic dorsal epidural fluid collection.

EXAM:
MRI LUMBAR SPINE WITHOUT CONTRAST
TECHNIQUE: Multiplanar, multisequence MR imaging of the lumbar spine was
performed. No intravenous contrast was administered.

[Series 2: T2 · sagittal · 4.0mm · 0.55mm/px · 5 of 15 slices shown (1 of 2)]
[im 1/15]
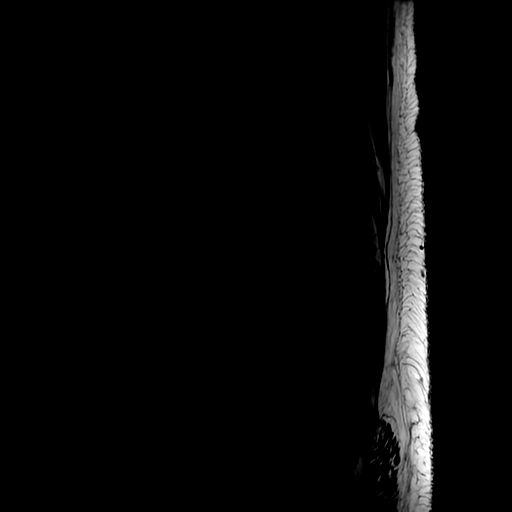
[im 4/15]
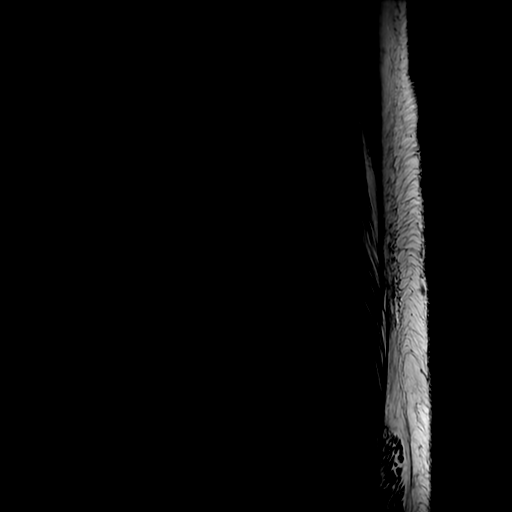
[im 8/15]
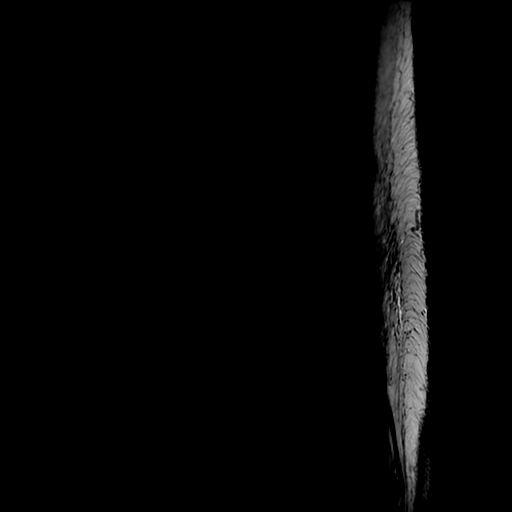
[im 11/15]
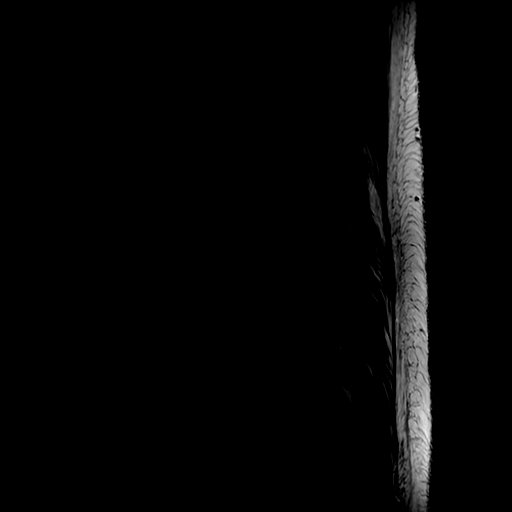
[im 15/15]
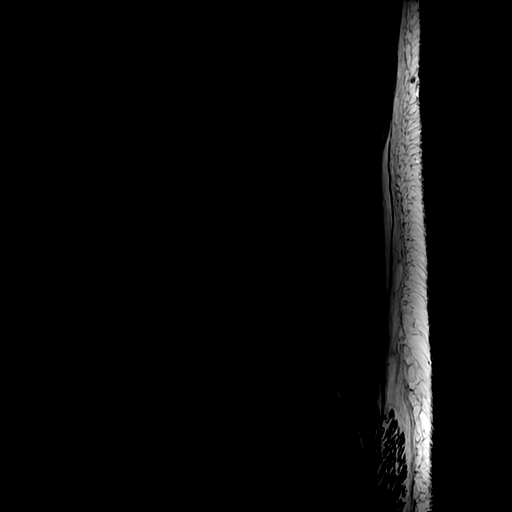

[Series 4: T1 · sagittal · 4.0mm · 0.55mm/px · 3 of 15 slices shown (1 of 2)]
[im 3/15]
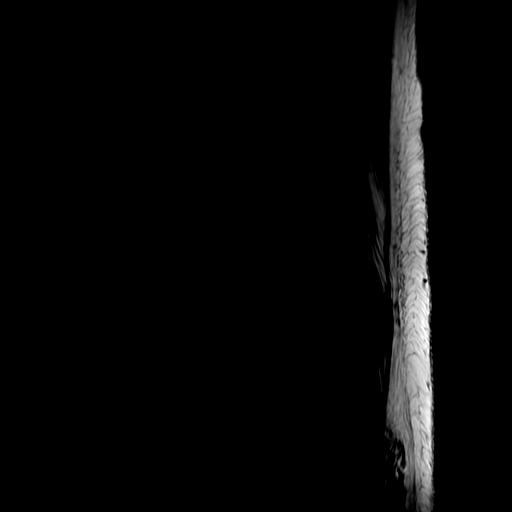
[im 9/15]
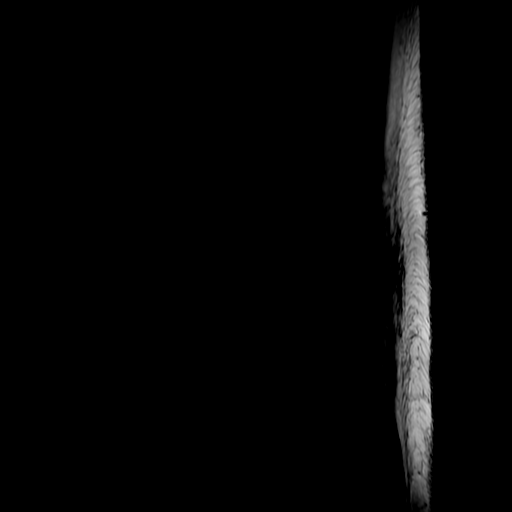
[im 15/15]
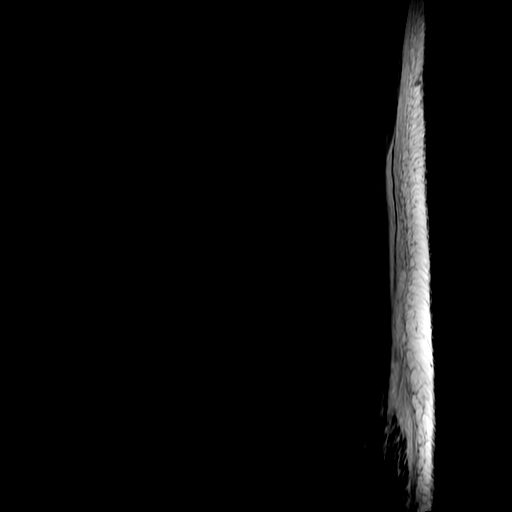

[Series 6: T2 · axial · 4.0mm · 0.39mm/px · z∈[-22,+137]mm · 6 of 33 slices shown (2 of 2)]
[im 1/33]
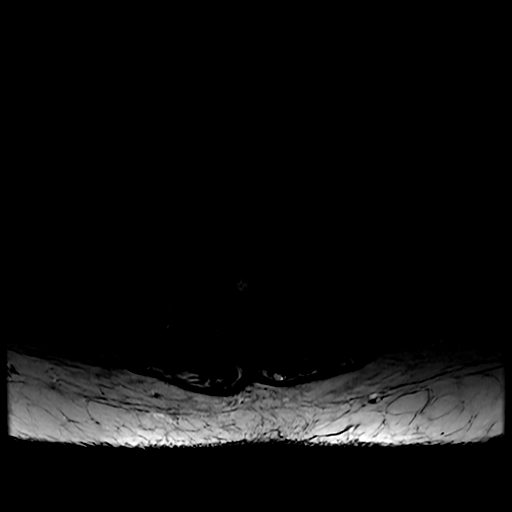
[im 6/33]
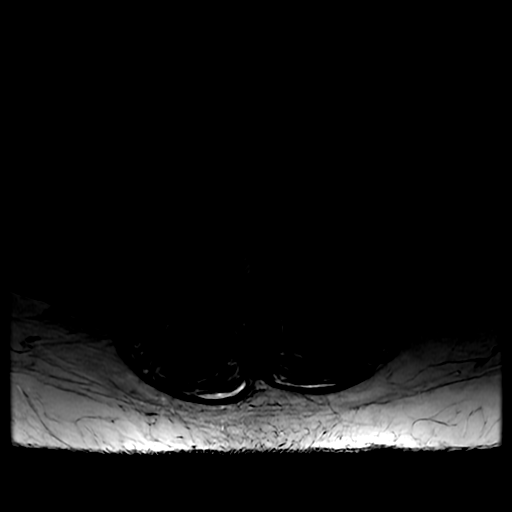
[im 9/33]
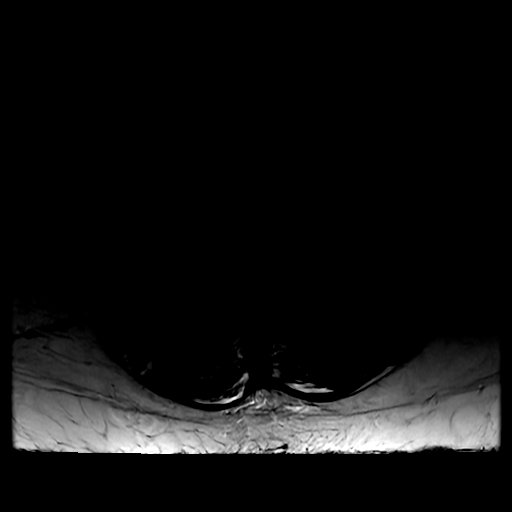
[im 15/33]
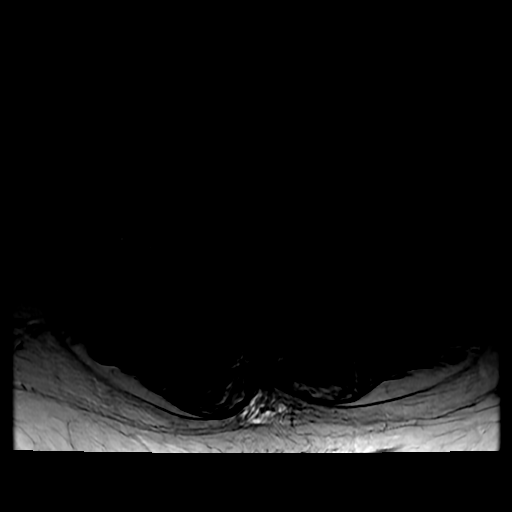
[im 18/33]
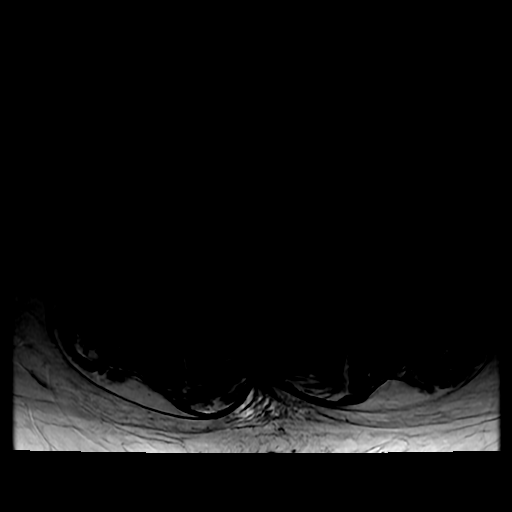
[im 30/33]
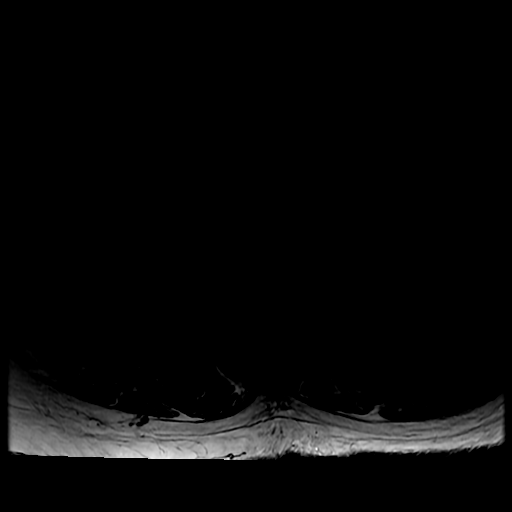

[Series 7: T1 · axial · 4.0mm · 0.39mm/px · z∈[+2,+132]mm · 3 of 35 slices shown (2 of 2)]
[im 6/35]
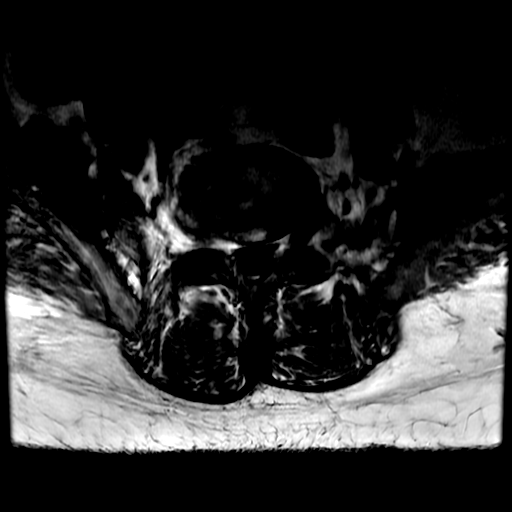
[im 18/35]
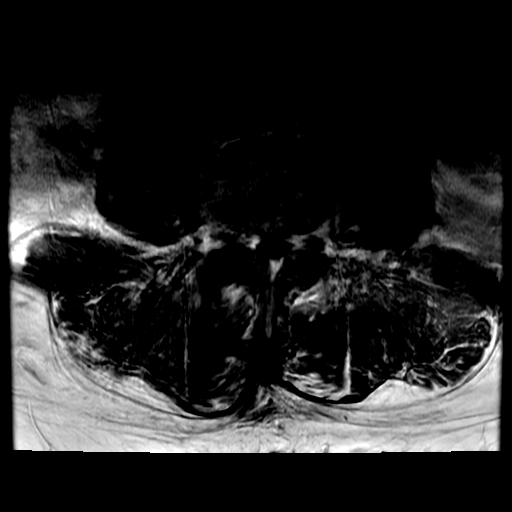
[im 29/35]
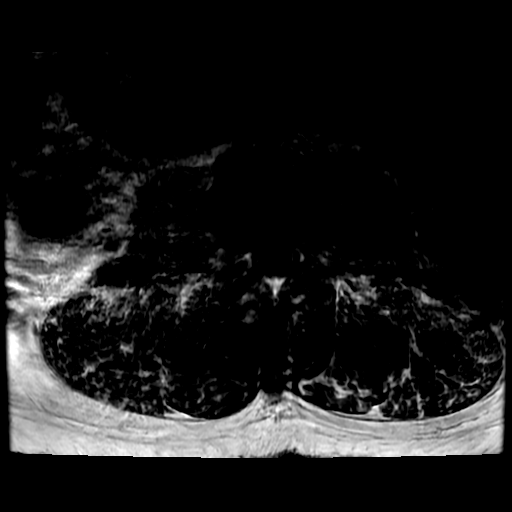

[17 of 48 positions shown; findings below may reference images not displayed]

FINDINGS: Segmentation:  5 lumbar type vertebral bodies assumed.

Alignment:  2 mm degenerative anterolisthesis L2-3, L3-4 and L4-5.

Vertebrae: No fracture or primary bone lesion. No finding to suggest
discitis or osteomyelitis. See below regarding facet arthropathy.

Conus medullaris and cauda equina: Conus extends to the T12-L1
level. There is what appears to be a subdural fluid collection at
the posterior left quadrant extending from T12-L1 to L2-3, with
maximal size on the axial images 7 x 11 mm in cross-sectional
diameter. This causes mass effect upon the neural structures,
crowding them anteriorly. Additionally, there is probably some
epidural material posteriorly at L3 to L4. There may be some
subdural component of this as well.

Paraspinal and other soft tissues: Nonspecific edematous change of
the posterior para spinous musculature, right worse than left. See
below.

Disc levels:

L1-2: No significant disc pathology. No canal stenosis. See above
regarding subdural collection.

L2-3: Facet arthropathy with 2 mm of anterolisthesis. Bulging of the
disc. Mild multifactorial spinal stenosis. Some facet edema on both
sides. This does not appear pronounced, but facet joint infection is
not excluded.

L3-4: Facet arthropathy right more than left with 2 mm of
anterolisthesis. Bulging of the disc. Moderate to severe
multifactorial spinal stenosis that could cause neural compression.
Facet arthropathy may simply be degenerative, but facet infection is
not excluded, particularly on the right.

L4-5: Facet arthropathy with 2 mm of anterolisthesis. Bulging of the
disc. Severe multifactorial spinal stenosis that could cause neural
compression. Facet edema right more than left that could be
degenerative or potentially relate to infection.

L5-S1: Mild bulging of the disc. Mild facet osteoarthritis. No
apparent compressive stenosis.
IMPRESSION: 1. There is what appears to be a subdural fluid collection at the
posterior left quadrant extending from T12-L1 to L2-3, with maximal
diameter on the axial images 7 x 11 mm in cross-sectional diameter.
This causes mass effect upon the neural structures, crowding them
anteriorly. Additionally, there is probably some epidural material
posteriorly at L3 to L4. This could be a small subdural component of
this as well. This could be infected material.
2. Degenerative disc disease and degenerative facet disease
throughout the lumbar spine. There is 2 mm of anterolisthesis at
L2-3, L3-4 and L4-5. There is disc degeneration with bulging of the
discs. There is moderate to severe multifactorial spinal stenosis at
L3-4 and severe spinal stenosis at L4-5 that could cause neural
compression. Facet joint edema at L2-3, L3-4 and L4-5 could be
degenerative or potentially relate to infection. The single most
suspicious looking facet joint is on the right at L3-4.
Additionally, the posterior para spinous soft tissue structures show
edematous change which could be inflammatory.

## 2020-01-06 SURGERY — POSTERIOR CERVICAL FUSION/FORAMINOTOMY LEVEL 5
Anesthesia: General

## 2020-01-06 MED ORDER — ROCURONIUM BROMIDE 10 MG/ML (PF) SYRINGE
PREFILLED_SYRINGE | INTRAVENOUS | Status: DC | PRN
  Administered 2020-01-06 (×3): 30 mg via INTRAVENOUS
  Administered 2020-01-06: 40 mg via INTRAVENOUS

## 2020-01-06 MED ORDER — FENTANYL CITRATE (PF) 250 MCG/5ML IJ SOLN
INTRAMUSCULAR | Status: AC
  Filled 2020-01-06: qty 5

## 2020-01-06 MED ORDER — CHLORHEXIDINE GLUCONATE CLOTH 2 % EX PADS
6.0000 | MEDICATED_PAD | Freq: Every day | CUTANEOUS | Status: DC
  Administered 2020-01-09 – 2020-01-13 (×4): 6 via TOPICAL

## 2020-01-06 MED ORDER — CHLORHEXIDINE GLUCONATE 0.12 % MT SOLN
15.0000 mL | OROMUCOSAL | Status: AC
  Filled 2020-01-06: qty 15

## 2020-01-06 MED ORDER — SODIUM CHLORIDE 0.9 % IV SOLN
0.3000 ug/kg | INTRAVENOUS | Status: AC
  Administered 2020-01-06: 27 ug via INTRAVENOUS
  Filled 2020-01-06: qty 6.8

## 2020-01-06 MED ORDER — ONDANSETRON HCL 4 MG/2ML IJ SOLN
INTRAMUSCULAR | Status: DC | PRN
  Administered 2020-01-06: 4 mg via INTRAVENOUS

## 2020-01-06 MED ORDER — SODIUM CHLORIDE 0.9 % IV SOLN
INTRAVENOUS | Status: DC | PRN
Start: 2020-01-06 — End: 2020-01-06

## 2020-01-06 MED ORDER — BUPIVACAINE HCL (PF) 0.5 % IJ SOLN
INTRAMUSCULAR | Status: DC | PRN
  Administered 2020-01-06: 5 mL

## 2020-01-06 MED ORDER — BACITRACIN ZINC 500 UNIT/GM EX OINT
TOPICAL_OINTMENT | CUTANEOUS | Status: AC
  Filled 2020-01-06: qty 28.35

## 2020-01-06 MED ORDER — ALBUMIN HUMAN 5 % IV SOLN
INTRAVENOUS | Status: DC | PRN
Start: 2020-01-06 — End: 2020-01-06

## 2020-01-06 MED ORDER — LIDOCAINE-EPINEPHRINE 1 %-1:100000 IJ SOLN
INTRAMUSCULAR | Status: DC | PRN
  Administered 2020-01-06: 5 mL

## 2020-01-06 MED ORDER — MIDAZOLAM HCL 2 MG/2ML IJ SOLN
INTRAMUSCULAR | Status: AC
  Filled 2020-01-06: qty 2

## 2020-01-06 MED ORDER — LIDOCAINE 2% (20 MG/ML) 5 ML SYRINGE
INTRAMUSCULAR | Status: DC | PRN
  Administered 2020-01-06: 60 mg via INTRAVENOUS

## 2020-01-06 MED ORDER — THROMBIN 20000 UNITS EX SOLR
CUTANEOUS | Status: AC
  Filled 2020-01-06: qty 20000

## 2020-01-06 MED ORDER — SODIUM CHLORIDE 0.9 % IV SOLN
0.3000 ug/kg | Freq: Once | INTRAVENOUS | Status: DC
  Filled 2020-01-06: qty 6.8

## 2020-01-06 MED ORDER — THROMBIN 5000 UNITS EX SOLR: OROMUCOSAL | Status: DC | PRN

## 2020-01-06 MED ORDER — FENTANYL CITRATE (PF) 250 MCG/5ML IJ SOLN
INTRAMUSCULAR | Status: DC | PRN
  Administered 2020-01-06: 50 ug via INTRAVENOUS
  Administered 2020-01-06: 25 ug via INTRAVENOUS
  Administered 2020-01-06: 50 ug via INTRAVENOUS
  Administered 2020-01-06: 25 ug via INTRAVENOUS
  Administered 2020-01-06: 50 ug via INTRAVENOUS

## 2020-01-06 MED ORDER — DEXAMETHASONE SODIUM PHOSPHATE 10 MG/ML IJ SOLN
INTRAMUSCULAR | Status: DC | PRN
  Administered 2020-01-06: 5 mg via INTRAVENOUS

## 2020-01-06 MED ORDER — SODIUM CHLORIDE 0.9 % IV SOLN: INTRAVENOUS | Status: DC

## 2020-01-06 MED ORDER — PROPOFOL 10 MG/ML IV BOLUS
INTRAVENOUS | Status: AC
  Filled 2020-01-06: qty 20

## 2020-01-06 MED ORDER — HEPARIN SODIUM (PORCINE) 1000 UNIT/ML IJ SOLN
INTRAMUSCULAR | Status: AC
  Administered 2020-01-06: 3800 [IU]
  Filled 2020-01-06: qty 4

## 2020-01-06 MED ORDER — THROMBIN 20000 UNITS EX SOLR
CUTANEOUS | Status: DC | PRN
  Administered 2020-01-06: 20 mL via TOPICAL

## 2020-01-06 MED ORDER — WHITE PETROLATUM EX OINT
TOPICAL_OINTMENT | CUTANEOUS | Status: AC
  Filled 2020-01-06: qty 28.35

## 2020-01-06 MED ORDER — PHENYLEPHRINE HCL-NACL 10-0.9 MG/250ML-% IV SOLN
INTRAVENOUS | Status: DC | PRN
  Administered 2020-01-06: 20 ug/min via INTRAVENOUS

## 2020-01-06 MED ORDER — BUPIVACAINE HCL (PF) 0.5 % IJ SOLN
INTRAMUSCULAR | Status: AC
  Filled 2020-01-06: qty 30

## 2020-01-06 MED ORDER — CEFAZOLIN SODIUM-DEXTROSE 2-4 GM/100ML-% IV SOLN
2.0000 g | INTRAVENOUS | Status: AC
  Filled 2020-01-06: qty 100

## 2020-01-06 MED ORDER — PROPOFOL 10 MG/ML IV BOLUS
INTRAVENOUS | Status: DC | PRN
  Administered 2020-01-06: 100 mg via INTRAVENOUS
  Administered 2020-01-06: 30 mg via INTRAVENOUS

## 2020-01-06 MED ORDER — VANCOMYCIN HCL 500 MG/100ML IV SOLN
500.0000 mg | Freq: Once | INTRAVENOUS | Status: AC
  Administered 2020-01-06: 500 mg via INTRAVENOUS
  Filled 2020-01-06: qty 100

## 2020-01-06 MED ORDER — CHLORHEXIDINE GLUCONATE 0.12 % MT SOLN
OROMUCOSAL | Status: AC
  Administered 2020-01-06: 15 mL via OROMUCOSAL
  Filled 2020-01-06: qty 15

## 2020-01-06 MED ORDER — SUGAMMADEX SODIUM 200 MG/2ML IV SOLN
INTRAVENOUS | Status: DC | PRN
  Administered 2020-01-06 (×2): 100 mg via INTRAVENOUS

## 2020-01-06 MED ORDER — BACITRACIN ZINC 500 UNIT/GM EX OINT
TOPICAL_OINTMENT | CUTANEOUS | Status: DC | PRN
  Administered 2020-01-06 (×2): 1 via TOPICAL

## 2020-01-06 MED ORDER — LIDOCAINE-EPINEPHRINE 1 %-1:100000 IJ SOLN
INTRAMUSCULAR | Status: AC
  Filled 2020-01-06: qty 1

## 2020-01-06 MED ORDER — THROMBIN 5000 UNITS EX SOLR
CUTANEOUS | Status: AC
  Filled 2020-01-06: qty 5000

## 2020-01-06 MED ORDER — SODIUM CHLORIDE 0.9 % IV SOLN
INTRAVENOUS | Status: DC | PRN
  Administered 2020-01-06: 500 mL

## 2020-01-06 SURGICAL SUPPLY — 66 items
APL SKNCLS STERI-STRIP NONHPOA (GAUZE/BANDAGES/DRESSINGS) ×1
BAG DECANTER FOR FLEXI CONT (MISCELLANEOUS) ×3 IMPLANT
BENZOIN TINCTURE PRP APPL 2/3 (GAUZE/BANDAGES/DRESSINGS) ×2 IMPLANT
BIT DRILL 2.4 (BIT) ×1 IMPLANT
BIT DRILL 2.4MM (BIT) ×1
BLADE CLIPPER SURG (BLADE) ×3 IMPLANT
BLADE ULTRA TIP 2M (BLADE) IMPLANT
BUR MATCHSTICK NEURO 3.0 LAGG (BURR) ×2 IMPLANT
BUR PRECISION FLUTE 5.0 (BURR) ×3 IMPLANT
BUR SPIRAL ROUTER 2.3 (BUR) ×1 IMPLANT
BUR SPIRAL ROUTER 2.3MM (BUR) ×1
CANISTER SUCT 3000ML PPV (MISCELLANEOUS) ×3 IMPLANT
CARTRIDGE OIL MAESTRO DRILL (MISCELLANEOUS) ×1 IMPLANT
CLOSURE WOUND 1/2 X4 (GAUZE/BANDAGES/DRESSINGS) ×1
COVER WAND RF STERILE (DRAPES) ×3 IMPLANT
DECANTER SPIKE VIAL GLASS SM (MISCELLANEOUS) ×3 IMPLANT
DIFFUSER DRILL AIR PNEUMATIC (MISCELLANEOUS) ×3 IMPLANT
DRAPE C-ARM 42X72 X-RAY (DRAPES) ×6 IMPLANT
DRAPE C-ARMOR (DRAPES) ×2 IMPLANT
DRAPE LAPAROTOMY 100X72 PEDS (DRAPES) ×3 IMPLANT
DRSG OPSITE POSTOP 4X8 (GAUZE/BANDAGES/DRESSINGS) ×3 IMPLANT
DURAPREP 6ML APPLICATOR 50/CS (WOUND CARE) ×3 IMPLANT
ELECT REM PT RETURN 9FT ADLT (ELECTROSURGICAL) ×3
ELECTRODE REM PT RTRN 9FT ADLT (ELECTROSURGICAL) ×1 IMPLANT
GAUZE 4X4 16PLY RFD (DISPOSABLE) ×2 IMPLANT
GAUZE SPONGE 4X4 12PLY STRL (GAUZE/BANDAGES/DRESSINGS) IMPLANT
GLOVE BIO SURGEON STRL SZ7 (GLOVE) ×2 IMPLANT
GLOVE BIOGEL PI IND STRL 7.0 (GLOVE) ×1 IMPLANT
GLOVE BIOGEL PI IND STRL 7.5 (GLOVE) ×1 IMPLANT
GLOVE BIOGEL PI INDICATOR 7.0 (GLOVE) ×2
GLOVE BIOGEL PI INDICATOR 7.5 (GLOVE) ×6
GLOVE ECLIPSE 7.0 STRL STRAW (GLOVE) ×5 IMPLANT
GLOVE EXAM NITRILE XL STR (GLOVE) IMPLANT
GOWN STRL REUS W/ TWL LRG LVL3 (GOWN DISPOSABLE) IMPLANT
GOWN STRL REUS W/ TWL XL LVL3 (GOWN DISPOSABLE) ×1 IMPLANT
GOWN STRL REUS W/TWL 2XL LVL3 (GOWN DISPOSABLE) ×6 IMPLANT
GOWN STRL REUS W/TWL LRG LVL3 (GOWN DISPOSABLE) ×6
GOWN STRL REUS W/TWL XL LVL3 (GOWN DISPOSABLE) ×3
KIT BASIN OR (CUSTOM PROCEDURE TRAY) ×3 IMPLANT
KIT TURNOVER KIT B (KITS) ×3 IMPLANT
NEEDLE HYPO 22GX1.5 SAFETY (NEEDLE) ×3 IMPLANT
NS IRRIG 1000ML POUR BTL (IV SOLUTION) ×3 IMPLANT
OIL CARTRIDGE MAESTRO DRILL (MISCELLANEOUS) ×3
PACK LAMINECTOMY NEURO (CUSTOM PROCEDURE TRAY) ×3 IMPLANT
PAD ARMBOARD 7.5X6 YLW CONV (MISCELLANEOUS) ×3 IMPLANT
PIN MAYFIELD SKULL DISP (PIN) ×3 IMPLANT
ROD SPINAL PRE CUT 3.5X70 (Rod) ×6 IMPLANT
SCREW MULTI AXIAL 3.5X14MM (Screw) ×18 IMPLANT
SCREW MULTI AXIAL 4.5X24 (Screw) ×4 IMPLANT
SEALER BIPOLAR AQUA 6.0 (INSTRUMENTS) ×2 IMPLANT
SET SCREW INFINITY IFIX THOR (Screw) ×16 IMPLANT
SPONGE LAP 4X18 RFD (DISPOSABLE) IMPLANT
SPONGE SURGIFOAM ABS GEL 100 (HEMOSTASIS) ×3 IMPLANT
STAPLER VISISTAT 35W (STAPLE) ×6 IMPLANT
STRIP CLOSURE SKIN 1/2X4 (GAUZE/BANDAGES/DRESSINGS) ×1 IMPLANT
SUT ETHILON 3 0 FSL (SUTURE) IMPLANT
SUT VIC AB 0 CT1 18XCR BRD8 (SUTURE) ×1 IMPLANT
SUT VIC AB 0 CT1 8-18 (SUTURE) ×15
SUT VIC AB 2-0 CT1 18 (SUTURE) IMPLANT
SUT VICRYL 3-0 RB1 18 ABS (SUTURE) ×2 IMPLANT
SWAB CULTURE LIQ STUART DBL (MISCELLANEOUS) ×2 IMPLANT
SWAB CULTURE LIQUID MINI MALE (MISCELLANEOUS) ×3 IMPLANT
TOWEL GREEN STERILE (TOWEL DISPOSABLE) ×3 IMPLANT
TOWEL GREEN STERILE FF (TOWEL DISPOSABLE) ×3 IMPLANT
UNDERPAD 30X36 HEAVY ABSORB (UNDERPADS AND DIAPERS) ×1 IMPLANT
WATER STERILE IRR 1000ML POUR (IV SOLUTION) ×3 IMPLANT

## 2020-01-06 NOTE — Progress Notes (Signed)
Dr. Nyoka Cowden, anesthesiologist made aware of cocaine use on January 03, 2020.

## 2020-01-06 NOTE — Progress Notes (Signed)
Echo attempted, patient in surgery. [name]

## 2020-01-06 NOTE — Anesthesia Preprocedure Evaluation (Addendum)
Anesthesia Evaluation  Patient identified by MRN, date of birth, ID band Patient awake    Reviewed: Allergy & Precautions, NPO status , Patient's Chart, lab work & pertinent test results  Airway Mallampati: II  TM Distance: >3 FB     Dental   Pulmonary sleep apnea , Current Smoker and Patient abstained from smoking.,    breath sounds clear to auscultation       Cardiovascular hypertension,  Rhythm:Regular Rate:Normal     Neuro/Psych negative neurological ROS     GI/Hepatic Neg liver ROS, GERD  ,  Endo/Other  diabetes  Renal/GU Renal disease     Musculoskeletal  (+) Arthritis ,   Abdominal   Peds  Hematology  (+) anemia ,   Anesthesia Other Findings   Reproductive/Obstetrics                             Anesthesia Physical Anesthesia Plan  ASA: III  Anesthesia Plan: General   Post-op Pain Management:    Induction: Intravenous  PONV Risk Score and Plan: 2 and Ondansetron, Dexamethasone and Midazolam  Airway Management Planned: Oral ETT and Video Laryngoscope Planned  Additional Equipment:   Intra-op Plan:   Post-operative Plan: Possible Post-op intubation/ventilation  Informed Consent:     Dental advisory given  Plan Discussed with: CRNA, Anesthesiologist and Surgeon  Anesthesia Plan Comments:        Anesthesia Quick Evaluation

## 2020-01-06 NOTE — Progress Notes (Signed)
[facility]  Date of Admission:  01/04/2020     Total days of antibiotics 2         ASSESSMENT:  Mr. 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 Scheduled for C4-T2 laminectomy and fusion today. Discussed importance of receiving dialysis and need for antibiotics with dialysis going forward. Will need line holiday for right temporary dialysis cathter. Will consider addition of rifampin depending hardware placement. Recheck blood cultures in the morning. TTE is scheduled. Pharmacy adjusting vancomycin dosages.  PLAN:  1. Continue vancomycin. 2. Recheck blood cultures in the morning. 3. Planned surgery today. 4. Will need line holiday given bacteremia. Coordinate with nephrology.  5. Await TTE results likely to need TEE 6. Continue dialysis per nephrology   Principal Problem:   MRSA bacteremia Active Problems:   TOBACCO ABUSE   OBSTRUCTIVE SLEEP APNEA   Colon cancer, status Post resection    HYPERTENSION   ESRD (end stage renal disease) (HCC)   Epidural abscess   Acute encephalopathy   Sepsis (HCC)    amLODipine  10 mg Oral QHS   atorvastatin  20 mg Oral QHS   Chlorhexidine Gluconate Cloth  6 each Topical Q0600   insulin aspart  0-6 Units Subcutaneous Q4H   metoprolol tartrate  50 mg Oral BID   vancomycin variable dose per unstable renal function (pharmacist dosing)   Does not apply See admin instructions    SUBJECTIVE:  Afebrile overnight with continued leukocytosis. Transferred to [facility] overnight for dialysis and neurosurgery. Continues to have decreased ability to walk. Denies fevers. Did not complete dialysis early this morning. Wishing to continue with dialysis. Wanting something to drink.   No Known Allergies   Review of Systems: Review of Systems  Constitutional: Negative for chills, fever and weight loss.  Respiratory: Negative for cough, shortness of breath and  wheezing.   Cardiovascular: Negative for chest pain and leg swelling.  Gastrointestinal: Negative for abdominal pain, constipation, diarrhea, nausea and vomiting.  Skin: Negative for rash.  Neurological: Positive for focal weakness.      OBJECTIVE: Vitals:   01/06/20 0100 01/06/20 0130 01/06/20 0538 01/06/20 1031  BP: (!) 142/72 (!) 151/81 (!) 160/77 (!) 151/61  Pulse: (!) 103 (!) 105 89 93  Resp: 18 18 17 18   Temp:  98.3 F (36.8 C) 98.4 F (36.9 C) 99.4 F (37.4 C)  TempSrc:  Oral  Oral  SpO2:  98% 99% 97%  Weight:  90 kg     Body mass index is 27.29 kg/m.  Physical Exam Constitutional:      General: He is not in acute distress.    Appearance: He is well-developed.     Comments: Lying in bed with head of bed elevated; pleasant.   HENT:     Mouth/Throat:     Comments: Poor dentition Cardiovascular:     Rate and Rhythm: Normal rate and regular rhythm.     Heart sounds: Normal heart sounds.     Comments: Dialysis cathter in right upper chest appears clean and dry.  Pulmonary:     Effort: Pulmonary effort is normal.     Breath sounds: Rhonchi present.  Skin:    General: Skin is warm and dry.  Neurological:     Mental Status: He is alert and oriented to person, place, and time.  Psychiatric:        Mood and Affect: Mood normal.  Lab Results Lab Results  Component Value Date   WBC 25.8 (H) 01/06/2020   HGB 8.7 (L) 01/06/2020   HCT 24.9 (L) 01/06/2020   MCV 73.5 (L) 01/06/2020   PLT 290 01/06/2020    Lab Results  Component Value Date   CREATININE 12.57 (H) 01/06/2020   BUN 86 (H) 01/06/2020   NA 137 01/06/2020   K 4.2 01/06/2020   CL 96 (L) 01/06/2020   CO2 20 (L) 01/06/2020    Lab Results  Component Value Date   ALT 20 01/04/2020   AST 21 01/04/2020   ALKPHOS 84 01/04/2020   BILITOT 1.1 01/04/2020     Microbiology: Recent Results (from the past 240 hour(s))  Culture, blood (routine x 2)     Status: Abnormal (Preliminary result)    Collection Time: 01/04/20 12:13 PM   Specimen: BLOOD RIGHT HAND  Result Value Ref Range Status   Specimen Description   Final    BLOOD RIGHT HAND Performed at [facility], [place] [address] [place], [place] [address]    Special Requests   Final    BOTTLES DRAWN AEROBIC AND ANAEROBIC Blood Culture adequate volume Performed at [facility] [address], [place], [place] [address]    Culture  Setup Time   Final    GRAM POSITIVE COCCI IN BOTH AEROBIC AND ANAEROBIC BOTTLES CRITICAL VALUE NOTED.  VALUE IS CONSISTENT WITH PREVIOUSLY REPORTED AND CALLED VALUE. Performed at [facility], [place] [address] [place], [place] [address]    Culture STAPHYLOCOCCUS AUREUS (A)  Final   Report Status PENDING  Incomplete  Culture, blood (routine x 2)     Status: Abnormal (Preliminary result)   Collection Time: 01/04/20 12:13 PM   Specimen: Site Not Specified; Blood  Result Value Ref Range Status   Specimen Description   Final    SITE NOT SPECIFIED Performed at [facility] [address], [place], [place] [address]    Special Requests   Final    BOTTLES DRAWN AEROBIC AND ANAEROBIC Blood Culture adequate volume Performed at [facility] [address], [place], [place] [address]    Culture  Setup Time   Final    GRAM POSITIVE COCCI IN BOTH AEROBIC AND ANAEROBIC BOTTLES Organism ID to follow CRITICAL RESULT CALLED TO, READ BACK BY AND VERIFIED WITH: D. WOFFORD PHARMD, AT 0745 01/05/20 BY D. VANHOOK    Culture (A)  Final    STAPHYLOCOCCUS AUREUS SUSCEPTIBILITIES TO FOLLOW Performed at [facility], [place] [address] [place], [place] [address]    Report Status PENDING  Incomplete  Blood Culture ID Panel (Reflexed)     Status: Abnormal   Collection Time: 01/04/20 12:13 PM  Result Value Ref Range Status   Enterococcus faecalis NOT DETECTED NOT DETECTED Final   Enterococcus Faecium NOT DETECTED NOT DETECTED  Final   Listeria monocytogenes NOT DETECTED NOT DETECTED Final   Staphylococcus species DETECTED (A) NOT DETECTED Final    Comment: CRITICAL RESULT CALLED TO, READ BACK BY AND VERIFIED WITH: D. WOFFORD PHARMD, AT 0745 01/05/20 BY D. VANHOOK    Staphylococcus aureus (BCID) DETECTED (A) NOT DETECTED Final    Comment: Methicillin (oxacillin)-resistant Staphylococcus aureus (MRSA). MRSA is predictably resistant to beta-lactam antibiotics (except ceftaroline). Preferred therapy is vancomycin unless clinically contraindicated. Patient requires contact precautions if  hospitalized. CRITICAL RESULT CALLED TO, READ BACK BY AND VERIFIED WITH: D. WOFFORD PHARMD, AT 0745 01/05/20 BY D. Victoriano Lain  Staphylococcus epidermidis NOT DETECTED NOT DETECTED Final   Staphylococcus lugdunensis NOT DETECTED NOT DETECTED Final   Streptococcus species NOT DETECTED NOT DETECTED Final   Streptococcus agalactiae NOT DETECTED NOT DETECTED Final   Streptococcus pneumoniae NOT DETECTED NOT DETECTED Final   Streptococcus pyogenes NOT DETECTED NOT DETECTED Final   A.calcoaceticus-baumannii NOT DETECTED NOT DETECTED Final   Bacteroides fragilis NOT DETECTED NOT DETECTED Final   Enterobacterales NOT DETECTED NOT DETECTED Final   Enterobacter cloacae complex NOT DETECTED NOT DETECTED Final   Escherichia coli NOT DETECTED NOT DETECTED Final   Klebsiella aerogenes NOT DETECTED NOT DETECTED Final   Klebsiella oxytoca NOT DETECTED NOT DETECTED Final   Klebsiella pneumoniae NOT DETECTED NOT DETECTED Final   Proteus species NOT DETECTED NOT DETECTED Final   Salmonella species NOT DETECTED NOT DETECTED Final   Serratia marcescens NOT DETECTED NOT DETECTED Final   Haemophilus influenzae NOT DETECTED NOT DETECTED Final   Neisseria meningitidis NOT DETECTED NOT DETECTED Final   Pseudomonas aeruginosa NOT DETECTED NOT DETECTED Final   Stenotrophomonas maltophilia NOT DETECTED NOT DETECTED Final   Candida albicans NOT DETECTED NOT  DETECTED Final   Candida auris NOT DETECTED NOT DETECTED Final   Candida glabrata NOT DETECTED NOT DETECTED Final   Candida krusei NOT DETECTED NOT DETECTED Final   Candida parapsilosis NOT DETECTED NOT DETECTED Final   Candida tropicalis NOT DETECTED NOT DETECTED Final   Cryptococcus neoformans/gattii NOT DETECTED NOT DETECTED Final   Meth resistant mecA/C and MREJ DETECTED (A) NOT DETECTED Final    Comment: CRITICAL RESULT CALLED TO, READ BACK BY AND VERIFIED WITH: D. WOFFORD PHARMD, AT 0745 01/05/20 BY Rush Landmark Performed at [facility], [address] [place], [place] [address]   SARS Coronavirus 2 by RT PCR (hospital order, performed in [facility] hospital lab) Nasopharyngeal Nasopharyngeal Swab     Status: None   Collection Time: 01/04/20  2:45 PM   Specimen: Nasopharyngeal Swab  Result Value Ref Range Status   SARS Coronavirus 2 NEGATIVE NEGATIVE Final    Comment: (NOTE) SARS-CoV-2 target nucleic acids are NOT DETECTED.  The SARS-CoV-2 RNA is generally detectable in upper and lower respiratory specimens during the acute phase of infection. The lowest concentration of SARS-CoV-2 viral copies this assay can detect is 250 copies / mL. A negative result does not preclude SARS-CoV-2 infection and should not be used as the sole basis for treatment or other patient management decisions.  A negative result may occur with improper specimen collection / handling, submission of specimen other than nasopharyngeal swab, presence of viral mutation(s) within the areas targeted by this assay, and inadequate number of viral copies (<250 copies / mL). A negative result must be combined with clinical observations, patient history, and epidemiological information.  Fact Sheet for Patients:   StrictlyIdeas.no  Fact Sheet for Healthcare Providers: BankingDealers.co.za  This test is not yet approved or  cleared by the [place] FDA  and has been authorized for detection and/or diagnosis of SARS-CoV-2 by FDA under an Emergency Use Authorization (EUA).  This EUA will remain in effect (meaning this test can be used) for the duration of the COVID-19 declaration under Section 564(b)(1) of the Act, 21 U.S.C. section 360bbb-3(b)(1), unless the authorization is terminated or revoked sooner.  Performed at [facility], [place] [address] [place], [place] [address]   Urine culture     Status: None   Collection Time: 01/04/20  9:20 PM   Specimen: Urine, Random  Result Value Ref Range Status   Specimen Description   Final    URINE, RANDOM Performed at [facility] [address] [place], [place] [address]    Special Requests   Final    NONE Performed at [facility], [place] [address] [place], [place] [address]    Culture   Final    NO GROWTH Performed at [facility], [place] [address], [place], [place] [address]    Report Status 01/05/2020 FINAL  Final     Terri Piedra, NP [facility]  01/06/2020  10:41 AM

## 2020-01-06 NOTE — Anesthesia Procedure Notes (Signed)
Arterial Line Insertion Start/End8/10/2019 1:30 PM Performed by: Janace Litten, CRNA, CRNA  Patient location: Pre-op. Preanesthetic checklist: patient identified, IV checked, site marked, risks and benefits discussed, surgical consent, monitors and equipment checked, pre-op evaluation, timeout performed and anesthesia consent Lidocaine 1% used for infiltration Right, radial was placed Catheter size: 20 Fr Hand hygiene performed  and maximum sterile barriers used   Attempts: 1 Procedure performed without using ultrasound guided technique. Following insertion, dressing applied and Biopatch. Post procedure assessment: normal and unchanged  Patient tolerated the procedure well with no immediate complications.

## 2020-01-06 NOTE — Progress Notes (Addendum)
  NEUROSURGERY PROGRESS NOTE   No issues overnight. Pt cont to have neck pain, no new N/T/W.  EXAM:  BP (!) 151/61 (BP Location: Right Arm)   Pulse 93   Temp 99.4 F (37.4 C) (Oral)   Resp 18   Ht 5' 11.5" (1.816 m)   Wt 90 kg   SpO2 97%   BMI 27.29 kg/m   Awake, alert, oriented  Speech fluent, appropriate  CN grossly intact  MAE well, no focal weakness  IMPRESSION:  69 y.o. male with ESRD, bacteremia, and cervicothoracic SEA, appears to be neurologically intact.  Imaging does demonstrate a large abscess with spinal cord compression due to compression from the abscess and underlying cervical disc disease.  With the degree of compression I do think that surgical decompression is reasonable.  I have also reviewed the MRI T and L spine. While the Tspine is negative for any SEA, the lumbar spine MRI reveals what appears to be a subdural fluid collection from T12-L1 to L2-3, although no large epidural abscess is seen. I do not think that the benefit of exploration/evacuation of the subdural fluid would outweigh the potential deleterious effect of opening the dura in the setting of infection in a patient with multiple medical comorbidities and likely impaired wound healing.  PLAN: - Will plan on proceeding with C4-T1 laminectomy, instrumented stabilization  I have reviewed the imaging findings with the patient as well as his family over the phone.  We discussed treatment options at this point including attempt at continued conservative treatment with antibiotics alone versus proceeding with surgical decompression for debridement of the abscess given the degree of direct compression.  We discussed risks of both approaches including the possibility of progression of the infection leading to worsening compression and possibly spinal cord injury.  We did also discuss the risks of surgery including nerve root and/or spinal cord injury leading to weakness, paralysis, and or bowel or bladder  dysfunction.  We did also discuss other associated risks including infection, spinal fluid leak, and bleeding as well as general risks of anesthesia including heart attack, stroke, and blood clots.  Both the patient and his family appear to understand our discussion.  They would like to proceed with surgery for decompression.  All their questions today were answered.  Informed consent was obtained and witnessed.

## 2020-01-06 NOTE — Anesthesia Postprocedure Evaluation (Signed)
Anesthesia Post Note  Patient: [name].  Procedure(s) Performed: CERVICAL FOUR- THORACIC TWO LAMINECTOMY AND FUSION (N/A )     Patient location during evaluation: PACU Anesthesia Type: General Level of consciousness: awake Pain management: pain level controlled Vital Signs Assessment: post-procedure vital signs reviewed and stable Respiratory status: spontaneous breathing Cardiovascular status: stable Postop Assessment: no apparent nausea or vomiting Anesthetic complications: no   No complications documented.  Last Vitals:  Vitals:   01/06/20 1031 01/06/20 1711  BP: (!) 151/61 (!) 157/68  Pulse: 93   Resp: 18 (!) 30  Temp: 37.4 C 37.9 C  SpO2: 97% (P) 100%    Last Pain:  Vitals:   01/06/20 1031  TempSrc: Oral  PainSc:                  [name]

## 2020-01-06 NOTE — Transfer of Care (Signed)
Immediate Anesthesia Transfer of Care Note  Patient: [name].  Procedure(s) Performed: CERVICAL FOUR- THORACIC TWO LAMINECTOMY AND FUSION (N/A )  Patient Location: PACU  Anesthesia Type:General  Level of Consciousness: awake and drowsy  Airway & Oxygen Therapy: Patient Spontanous Breathing and Patient connected to face mask oxygen  Post-op Assessment: Report given to RN and Post -op Vital signs reviewed and stable  Post vital signs: Reviewed and stable  Last Vitals:  Vitals Value Taken Time  BP 157/68 01/06/20 1711  Temp 37.9 C 01/06/20 1711  Pulse 96 01/06/20 1718  Resp 26 01/06/20 1718  SpO2 100 % 01/06/20 1718  Vitals shown include unvalidated device data.  Last Pain:  Vitals:   01/06/20 1031  TempSrc: Oral  PainSc:          Complications: No complications documented.

## 2020-01-06 NOTE — Progress Notes (Addendum)
[facility] Progress Note   Subjective:   Patient seen and examined at bedside.  Continues to have headache, back pain, stiff neck and weakness in his extremities.  Biggest complaint this AM is he is hungry and wants to eat.  Discussed he is scheduled for surgery today and will have to wait until after to eat.  Patient agreed.  Oriented to self, hospital and month/year.  Scattered thoughts, have to redirect to answer questions.  Reports he signed off of dialysis early last night because he did not get along with the nurse.   Objective Vitals:   01/06/20 0030 01/06/20 0100 01/06/20 0130 01/06/20 0538  BP: (!) 146/70 (!) 142/72 (!) 151/81 (!) 160/77  Pulse: (!) 106 (!) 103 (!) 105 89  Resp: 18 18 18 17   Temp:   98.3 F (36.8 C) 98.4 F (36.9 C)  TempSrc:   Oral   SpO2:   98% 99%  Weight:   90 kg    Physical Exam General:chronically ill appearing male, resting in bed in NAD, distracted with scattered thoughts, slow mentation Heart:RRR Lungs:CTAB anteriolaterally Abdomen:soft, NTND Extremities:no edema Dialysis Access: LU AVF +b   Filed Weights   01/05/20 2340 01/06/20 0130  Weight: 90 kg 90 kg    Intake/Output Summary (Last 24 hours) at 01/06/2020 0956 Last data filed at 01/06/2020 0538 Gross per 24 hour  Intake 700 ml  Output 190 ml  Net 510 ml    Additional Objective Labs: Basic Metabolic Panel: Recent Labs  Lab 01/04/20 1213 01/05/20 0502 01/06/20 0258  NA 131* 136 137  K 4.4 4.5 4.2  CL 90* 94* 96*  CO2 23 22 20*  GLUCOSE 233* 192* 144*  BUN 96* 70* 86*  CREATININE 15.45* 16.74* 12.57*  CALCIUM 8.7* 8.6* 8.3*   Liver Function Tests: Recent Labs  Lab 01/04/20 1213  AST 21  ALT 20  ALKPHOS 84  BILITOT 1.1  PROT 9.1*  ALBUMIN 3.2*   CBC: Recent Labs  Lab 12/30/19 2212 12/30/19 2212 01/03/20 1524 01/03/20 1524 01/04/20 1213 01/05/20 0502 01/06/20 0258  WBC 15.0*   < > 25.8*   < > 25.3* 24.1* 25.8*  NEUTROABS  --   --   --   --   22.5*  --   --   HGB 8.6*   < > 9.5*   < > 10.0* 8.8* 8.7*  HCT 25.6*   < > 27.4*   < > 28.6* 25.3* 24.9*  MCV 76.9*  --  74.1*  --  75.5* 74.4* 73.5*  PLT 180   < > 276   < > 333 266 290   < > = values in this interval not displayed.   Blood Culture    Component Value Date/Time   SDES  01/04/2020 2120    URINE, RANDOM Performed at [facility], [place] [address], [place], [place] [address]    SPECREQUEST  01/04/2020 2120    NONE Performed at [facility], [place] [address] [place], [place] [address]    CULT  01/04/2020 2120    NO GROWTH Performed at [place] [address], [place],  [address]    REPTSTATUS 01/05/2020 FINAL 01/04/2020 2120   CBG: Recent Labs  Lab 01/05/20 0719 01/05/20 1231 01/05/20 1621 01/05/20 2100 01/06/20 0722  GLUCAP 172* 169* 166* 175* 160*    Lab Results  Component Value Date   INR 1.3 (H) 01/04/2020   INR 1.3 (H) 12/30/2019  INR 1.25 04/14/2012   Studies/Results: DG Chest 2 View  Result Date: 01/04/2020 CLINICAL DATA:  Back pain, weakness and shortness of breath EXAM: CHEST - 2 VIEW COMPARISON:  Radiograph 12/30/2019 FINDINGS: Tunneled dual lumen dialysis catheter tip terminates at the superior cavoatrial junction, similar position to prior. Cardiomediastinal contours are unremarkable. Telemetry leads overlie the chest. Some hazy interstitial opacities are present throughout the lungs with central vascular congestion and cephalization. No visible pneumothorax or effusion. No acute osseous or soft tissue abnormality. IMPRESSION: Hazy opacities likely reflect edema/volume overload given vascular congestion and redistribution though should correlate with clinical setting as atypical infection could present similarly. Electronically Signed   By: Lovena Le M.D.   On: 01/04/2020 15:02   MR CERVICAL SPINE WO CONTRAST  Result Date: 01/04/2020 CLINICAL DATA:  Severe diffuse back pain. Study was  ordered without and contrast. None contrast imaging the cervical spine was completed. Patient refused further imaging due to pain. Study is moderately degraded by patient motion. EXAM: MRI CERVICAL SPINE WITHOUT CONTRAST TECHNIQUE: Multiplanar, multisequence MR imaging of the cervical spine was performed. No intravenous contrast was administered. COMPARISON:  MR head without contrast 12/31/2019. FINDINGS: Alignment: Slight retrolisthesis is present at C5-6. Degenerative anterolisthesis is present T1-2. Reversal of the normal cervical lordosis is noted. Vertebrae: Decreased T1 marrow signal is present in the lower cervical spine, particularly C7 and T1. Vertebral body heights are maintained. Cord: T2 signal hyperintensity is present in the spinal cord from C3-4 through C5. Posterior Fossa, vertebral arteries, paraspinal tissues: Visualized brainstem and cerebellum are normal. Diffuse edema is present in the occipital paraspinous musculature bilaterally. No discrete abscess is present. Extensive prevertebral edema extends from the clivus to C6. No abscess is present. Mild edema is present in the posterior paraspinous muscles, left greater than right. Disc levels: a abnormal fluid present in the dorsal spinal canal extending from foramen magnum to the T2 level. Severe disc disease present at C5-6 and C6-7. The cord is compressed at these levels. Mild disc disease present at C3-4 and C4-5 with some cord compression at these levels. IMPRESSION: 1. Abnormal fluid in the dorsal spinal canal extending from foramen magnum to the T2 level. Given the other inflammatory changes and concern, this most likely represents epidural infection. There is some fluid ventral to the spinal cord at C2. 2. Diffuse prevertebral edema from the clivus to C6 is also concerning for infection. 3. The cord is compressed without abnormal signal suggesting edema. 4. Mild edema in the posterior paraspinous muscles, left greater than right. Edema is  most significant within the paraspinous muscles just below the occiput. No focal abscess is present. 5. Severe disc disease at C5-6 and C6-7 with moderate central canal stenosis at both levels. 6. Mild disc disease at C3-4 and C4-5 with some cord compression at these levels. 7. Abnormal marrow signal in the lower cervical spine, particularly C7 and T1. This is nonspecific and can be seen in the setting of anemia, smoking, or obesity. Electronically Signed   By: San Morelle M.D.   On: 01/04/2020 18:51   MR THORACIC SPINE WO CONTRAST  Result Date: 01/05/2020 CLINICAL DATA:  Epidural fluid collection.  Back pain and neck pain. EXAM: MRI THORACIC SPINE WITHOUT CONTRAST TECHNIQUE: Multiplanar, multisequence MR imaging of the thoracic spine was performed. No intravenous contrast was administered. COMPARISON:  None. FINDINGS: Alignment:  Physiologic. Vertebrae: No fracture, evidence of discitis, or bone lesion. Cord: Dorsal epidural fluid collection extending from the cervical spine to T2 level  is again seen. There is no continuation of the collection below the T2 level. Spinal cord signal is normal. Paraspinal and other soft tissues: Negative. Disc levels: At T2-3, there is a small right subarticular disc protrusion. There is no other thoracic disc herniation. No spinal canal or neural foraminal stenosis. IMPRESSION: 1. Dorsal epidural fluid collection extending from the cervical spine to T2 level, consistent with epidural abscess. No continuation below the T2 level. 2. No thoracic spinal canal or neural foraminal stenosis. Electronically Signed   By: Ulyses Jarred M.D.   On: 01/05/2020 19:42    Medications: . vancomycin     . amLODipine  10 mg Oral QHS  . atorvastatin  20 mg Oral QHS  . Chlorhexidine Gluconate Cloth  6 each Topical Q0600  . insulin aspart  0-6 Units Subcutaneous Q4H  . metoprolol tartrate  50 mg Oral BID  . vancomycin variable dose per unstable renal function (pharmacist dosing)    Does not apply See admin instructions    Dialysis Orders: Center: [facility]  on MWF . EDW 99 kg HD Bath 2K/2Ca  Time 4.25 hrs Heparin none. Access RIJ TDC and LUE AVF BFR 400 DFR 800    Calcitriol 1.25 mcg po/HD  Micera 150 mcg IV q 2 weeks last given on 12/29/19  Assessment/Plan: 1. Epidural abscess - Etiology unclear. IR unable to aspirate.  MRI showed abscess extending from C4-T2 with cord compression.  Neurosurgery plans for C4-T2 laminectomy and fusion for decompression today. Per primary/neuro. 2. MRSA bacteremia - ID consulted - recommend Vanc, to have TTE.  Awaiting results on repeat BC and TTE for further management.  Has TDC in place.  Plan to use LU AVF and hopefully have TDC removed if successful use d/t potential source of infection.  3. Weakness all 4 extremities - has epidural abscess with cervical stenosis from C4-T2.  Plan for surgery today, per neurosurgery.  4. ESRD - on HD MWF. Shortened treatment overnight, completed 1.23 hrs.  Will plan for HD either later today or tomorrow pending surgery. K 4.2, BUN 86 today.   5. Anemia of CKD- Hgb 8.7.  ESA recently dosed.  Follow trends, transfuse prn.  6. Secondary hyperparathyroidism - Ca at goal. Will check phos. Continue VDRA and binders when no longer NPO.  7. HTN/volume - BP elevated.  Continue home meds when no longer NPO.  Volume status stable.  UF as tolerated.  8. Nutrition - currently NPO.  Renal diet w/fluid restrictions once advanced.   Jen Mow, PA-C [facility] 01/06/2020,9:56 AM  LOS: 2 days   I have seen and examined this patient and agree with plan and assessment in the above note with renal recommendations/intervention highlighted.  Pt s/o early last night but was in significant discomfort.  He is depressed about his inability to walk and now couldn't turn his neck.  I don't think he can make an informed decision about stopping dialysis until he can have the abscesses addressed and hopefully will  have improved functional status. [place] John A Nile Prisk,MD 01/06/2020 10:58 AM

## 2020-01-06 NOTE — Anesthesia Procedure Notes (Signed)
Procedure Name: Intubation Date/Time: 01/06/2020 1:50 PM Performed by: Darletta Moll, CRNA Pre-anesthesia Checklist: Patient identified, Emergency Drugs available, Suction available and Patient being monitored Patient Re-evaluated:Patient Re-evaluated prior to induction Oxygen Delivery Method: Circle system utilized Preoxygenation: Pre-oxygenation with 100% oxygen Induction Type: IV induction Ventilation: Mask ventilation without difficulty Laryngoscope Size: Glidescope and 4 Grade View: Grade I Tube type: Oral Tube size: 7.5 mm Number of attempts: 1 Airway Equipment and Method: Stylet Placement Confirmation: ETT inserted through vocal cords under direct vision,  positive ETCO2 and breath sounds checked- equal and bilateral Tube secured with: Tape Dental Injury: Teeth and Oropharynx as per pre-operative assessment  Difficulty Due To: Difficult Airway- due to reduced neck mobility

## 2020-01-06 NOTE — Progress Notes (Signed)
PROGRESS NOTE    [name].  WUJ:[id number] DOB: 1951-06-03 DOA: 01/04/2020 PCP: [facility]   Brief Narrative: Rodney Hairfield. is a 69 y.o. malewithhistory of ESRD on hemodialysis on Monday Wednesday Friday, hypertension, diabetes mellitus type 2, anemia history of colon cancer status post resection. Patient presented secondary to worsening headache and back pain and found to have an epidural abscess with associated cord compression in addition to MRSA positive bacteremia. Vancomycin started and neurosurgery consulted for management.   Assessment & Plan:   Principal Problem:   MRSA bacteremia Active Problems:   TOBACCO ABUSE   OBSTRUCTIVE SLEEP APNEA   Colon cancer, status Post resection    HYPERTENSION   ESRD (end stage renal disease) ([place])   Epidural abscess   Acute encephalopathy   Sepsis ([place])   Sepsis Present on admission and secondary to bacteremia and epidural abscess. Associated leukocytosis which is currently stable. Afebrile. Patient started empirically on Vancomycin IV.  Cervical cord compression Epidural abscess Upper/lower extremity weakness Neurosurgery consulted on admission and plan for C4-T2 laminectomy and fusion for decompression. -Neurosurgery recommendations: pending today  MRSA bacteremia Noted on admission blood cultures. Patient started on Vancomycin IV empirically. Infectious disease consulted -Infectious disease recommendations: Vancomycin, Transthoracic Echocardiogram  ESRD on HD -Nephrology management  Anemia of chronic disease In setting of kidney disease. Stable.  Essential hypertension On amlodipine and metoprolol as an outpatient -Continue amlodipine and metoprolol  Diabetes mellitus, type 2 Patient is on Levemir 24 units qhs as an outpatient. Placed on SSI q4 hours while NPO -Continue SSI for now  History of colon cancer S/p resection.  Hyperlipidemia -Continue Lipitor   DVT prophylaxis: SCDs Code  Status:   Code Status: Full Code Family Communication: None at bedside Disposition Plan: Discharge pending continued workup and management of epidural abscess and bacteremia   Consultants:   Neurosurgery  Nephrology  Infectious disease  Procedures:   HEMODIALYSIS  Antimicrobials:  Vancomycin    Subjective: Patient states that he has no intention of stopping HD and his ultimate goal is renal transplant. He reports that he is not refusing surgery. He reports some mild improvement in his LE strength  Objective: Vitals:   01/06/20 0030 01/06/20 0100 01/06/20 0130 01/06/20 0538  BP: (!) 146/70 (!) 142/72 (!) 151/81 (!) 160/77  Pulse: (!) 106 (!) 103 (!) 105 89  Resp: 18 18 18 17   Temp:   98.3 F (36.8 C) 98.4 F (36.9 C)  TempSrc:   Oral   SpO2:   98% 99%  Weight:   90 kg     Intake/Output Summary (Last 24 hours) at 01/06/2020 0850 Last data filed at 01/06/2020 0538 Gross per 24 hour  Intake 700 ml  Output 190 ml  Net 510 ml   Filed Weights   01/05/20 2340 01/06/20 0130  Weight: 90 kg 90 kg    Examination:  General exam: Appears calm and comfortable Respiratory system: Clear to auscultation. Respiratory effort normal. Cardiovascular system: S1 & S2 heard, RRR. No murmurs, rubs, gallops or clicks. Gastrointestinal system: Abdomen is nondistended, soft and nontender. No organomegaly or masses felt. Normal bowel sounds heard. Central nervous system: Alert. 4/5 UE and 3/5 LE strength Musculoskeletal: No edema. No calf tenderness Skin: No cyanosis. No rashes Psychiatry: Judgement and insight appear normal. Mood & affect appropriate.     Data Reviewed: I have personally reviewed following labs and imaging studies  CBC Lab Results  Component Value Date   WBC 25.8 (H) 01/06/2020  RBC 3.39 (L) 01/06/2020   HGB 8.7 (L) 01/06/2020   HCT 24.9 (L) 01/06/2020   MCV 73.5 (L) 01/06/2020   MCH 25.7 (L) 01/06/2020   PLT 290 01/06/2020   MCHC 34.9 01/06/2020   RDW  17.0 (H) 01/06/2020   LYMPHSABS 1.0 01/04/2020   MONOABS 1.5 (H) 01/04/2020   EOSABS 0.0 01/04/2020   BASOSABS 0.0 62/22/9798     Last metabolic panel Lab Results  Component Value Date   NA 137 01/06/2020   K 4.2 01/06/2020   CL 96 (L) 01/06/2020   CO2 20 (L) 01/06/2020   BUN 86 (H) 01/06/2020   CREATININE 12.57 (H) 01/06/2020   GLUCOSE 144 (H) 01/06/2020   GFRNONAA 4 (L) 01/06/2020   GFRAA 4 (L) 01/06/2020   CALCIUM 8.3 (L) 01/06/2020   PHOS 4.1 04/22/2012   PROT 9.1 (H) 01/04/2020   ALBUMIN 3.2 (L) 01/04/2020   BILITOT 1.1 01/04/2020   ALKPHOS 84 01/04/2020   AST 21 01/04/2020   ALT 20 01/04/2020   ANIONGAP 21 (H) 01/06/2020    CBG (last 3)  Recent Labs    01/05/20 1621 01/05/20 2100 01/06/20 0722  GLUCAP 166* 175* 160*     GFR: Estimated Creatinine Clearance: 6.1 mL/min (A) (by C-G formula based on SCr of 12.57 mg/dL (H)).  Coagulation Profile: Recent Labs  Lab 12/30/19 2212 01/04/20 1213  INR 1.3* 1.3*    Recent Results (from the past 240 hour(s))  Culture, blood (routine x 2)     Status: Abnormal (Preliminary result)   Collection Time: 01/04/20 12:13 PM   Specimen: BLOOD RIGHT HAND  Result Value Ref Range Status   Specimen Description   Final    BLOOD RIGHT HAND Performed at [facility] [address], [place], [place] [address]    Special Requests   Final    BOTTLES DRAWN AEROBIC AND ANAEROBIC Blood Culture adequate volume Performed at [facility] [address], [place], [place] [address]    Culture  Setup Time   Final    GRAM POSITIVE COCCI IN BOTH AEROBIC AND ANAEROBIC BOTTLES CRITICAL VALUE NOTED.  VALUE IS CONSISTENT WITH PREVIOUSLY REPORTED AND CALLED VALUE. Performed at [facility], [place] [address], [place], [place] [address]    Culture STAPHYLOCOCCUS AUREUS (A)  Final   Report Status PENDING  Incomplete  Culture, blood (routine x 2)     Status: Abnormal (Preliminary result)    Collection Time: 01/04/20 12:13 PM   Specimen: Site Not Specified; Blood  Result Value Ref Range Status   Specimen Description   Final    SITE NOT SPECIFIED Performed at [facility] [address], [place], [place] [address]    Special Requests   Final    BOTTLES DRAWN AEROBIC AND ANAEROBIC Blood Culture adequate volume Performed at [facility] [address] [place], [place] [address]    Culture  Setup Time   Final    GRAM POSITIVE COCCI IN BOTH AEROBIC AND ANAEROBIC BOTTLES Organism ID to follow CRITICAL RESULT CALLED TO, READ BACK BY AND VERIFIED WITH: D. WOFFORD PHARMD, AT 0745 01/05/20 BY D. VANHOOK    Culture (A)  Final    STAPHYLOCOCCUS AUREUS SUSCEPTIBILITIES TO FOLLOW Performed at [facility], [place] [address], [place],  [address]    Report Status PENDING  Incomplete  Blood Culture ID Panel (Reflexed)     Status: Abnormal   Collection Time: 01/04/20 12:13 PM  Result Value Ref Range Status   Enterococcus faecalis NOT DETECTED NOT DETECTED Final   Enterococcus Faecium NOT DETECTED NOT DETECTED Final   Listeria monocytogenes NOT DETECTED NOT DETECTED Final   Staphylococcus species DETECTED (A) NOT DETECTED Final    Comment: CRITICAL RESULT CALLED TO, READ BACK BY AND VERIFIED WITH: D. WOFFORD PHARMD, AT 0745 01/05/20 BY D. VANHOOK    Staphylococcus aureus (BCID) DETECTED (A) NOT DETECTED Final    Comment: Methicillin (oxacillin)-resistant Staphylococcus aureus (MRSA). MRSA is predictably resistant to beta-lactam antibiotics (except ceftaroline). Preferred therapy is vancomycin unless clinically contraindicated. Patient requires contact precautions if  hospitalized. CRITICAL RESULT CALLED TO, READ BACK BY AND VERIFIED WITH: D. WOFFORD PHARMD, AT 0745 01/05/20 BY D. VANHOOK    Staphylococcus epidermidis NOT DETECTED NOT DETECTED Final   Staphylococcus lugdunensis NOT DETECTED NOT DETECTED Final   Streptococcus species NOT  DETECTED NOT DETECTED Final   Streptococcus agalactiae NOT DETECTED NOT DETECTED Final   Streptococcus pneumoniae NOT DETECTED NOT DETECTED Final   Streptococcus pyogenes NOT DETECTED NOT DETECTED Final   A.calcoaceticus-baumannii NOT DETECTED NOT DETECTED Final   Bacteroides fragilis NOT DETECTED NOT DETECTED Final   Enterobacterales NOT DETECTED NOT DETECTED Final   Enterobacter cloacae complex NOT DETECTED NOT DETECTED Final   Escherichia coli NOT DETECTED NOT DETECTED Final   Klebsiella aerogenes NOT DETECTED NOT DETECTED Final   Klebsiella oxytoca NOT DETECTED NOT DETECTED Final   Klebsiella pneumoniae NOT DETECTED NOT DETECTED Final   Proteus species NOT DETECTED NOT DETECTED Final   Salmonella species NOT DETECTED NOT DETECTED Final   Serratia marcescens NOT DETECTED NOT DETECTED Final   Haemophilus influenzae NOT DETECTED NOT DETECTED Final   Neisseria meningitidis NOT DETECTED NOT DETECTED Final   Pseudomonas aeruginosa NOT DETECTED NOT DETECTED Final   Stenotrophomonas maltophilia NOT DETECTED NOT DETECTED Final   Candida albicans NOT DETECTED NOT DETECTED Final   Candida auris NOT DETECTED NOT DETECTED Final   Candida glabrata NOT DETECTED NOT DETECTED Final   Candida krusei NOT DETECTED NOT DETECTED Final   Candida parapsilosis NOT DETECTED NOT DETECTED Final   Candida tropicalis NOT DETECTED NOT DETECTED Final   Cryptococcus neoformans/gattii NOT DETECTED NOT DETECTED Final   Meth resistant mecA/C and MREJ DETECTED (A) NOT DETECTED Final    Comment: CRITICAL RESULT CALLED TO, READ BACK BY AND VERIFIED WITH: D. WOFFORD PHARMD, AT 0745 01/05/20 BY Rush Landmark Performed at [facility], [address], [place], [place] [address]   SARS Coronavirus 2 by RT PCR (hospital order, performed in [facility] hospital lab) Nasopharyngeal Nasopharyngeal Swab     Status: None   Collection Time: 01/04/20  2:45 PM   Specimen: Nasopharyngeal Swab  Result Value Ref Range Status    SARS Coronavirus 2 NEGATIVE NEGATIVE Final    Comment: (NOTE) SARS-CoV-2 target nucleic acids are NOT DETECTED.  The SARS-CoV-2 RNA is generally detectable in upper and lower respiratory specimens during the acute phase of infection. The lowest concentration of SARS-CoV-2 viral copies this assay can detect is 250 copies / mL. A negative result does not preclude SARS-CoV-2 infection and should not be used as the sole basis for treatment or other patient management decisions.  A negative result may occur with improper specimen collection / handling, submission of specimen other than nasopharyngeal swab, presence of viral mutation(s) within the areas targeted by this assay, and inadequate number of viral copies (<250 copies / mL). A negative result must be combined with clinical observations,  patient history, and epidemiological information.  Fact Sheet for Patients:   StrictlyIdeas.no  Fact Sheet for Healthcare Providers: BankingDealers.co.za  This test is not yet approved or  cleared by the [place] FDA and has been authorized for detection and/or diagnosis of SARS-CoV-2 by FDA under an Emergency Use Authorization (EUA).  This EUA will remain in effect (meaning this test can be used) for the duration of the COVID-19 declaration under Section 564(b)(1) of the Act, 21 U.S.C. section 360bbb-3(b)(1), unless the authorization is terminated or revoked sooner.  Performed at [facility], [place] [address] [place], [place] [address]   Urine culture     Status: None   Collection Time: 01/04/20  9:20 PM   Specimen: Urine, Random  Result Value Ref Range Status   Specimen Description   Final    URINE, RANDOM Performed at [facility] [address] [place], [place] [address]    Special Requests   Final    NONE Performed at [facility], [place] [address], [place], [place] [address]     Culture   Final    NO GROWTH Performed at [facility], [place] [address] [place], [place] [address]    Report Status 01/05/2020 FINAL  Final        Radiology Studies: DG Chest 2 View  Result Date: 01/04/2020 CLINICAL DATA:  Back pain, weakness and shortness of breath EXAM: CHEST - 2 VIEW COMPARISON:  Radiograph 12/30/2019 FINDINGS: Tunneled dual lumen dialysis catheter tip terminates at the superior cavoatrial junction, similar position to prior. Cardiomediastinal contours are unremarkable. Telemetry leads overlie the chest. Some hazy interstitial opacities are present throughout the lungs with central vascular congestion and cephalization. No visible pneumothorax or effusion. No acute osseous or soft tissue abnormality. IMPRESSION: Hazy opacities likely reflect edema/volume overload given vascular congestion and redistribution though should correlate with clinical setting as atypical infection could present similarly. Electronically Signed   By: Lovena Le M.D.   On: 01/04/2020 15:02   MR CERVICAL SPINE WO CONTRAST  Result Date: 01/04/2020 CLINICAL DATA:  Severe diffuse back pain. Study was ordered without and contrast. None contrast imaging the cervical spine was completed. Patient refused further imaging due to pain. Study is moderately degraded by patient motion. EXAM: MRI CERVICAL SPINE WITHOUT CONTRAST TECHNIQUE: Multiplanar, multisequence MR imaging of the cervical spine was performed. No intravenous contrast was administered. COMPARISON:  MR head without contrast 12/31/2019. FINDINGS: Alignment: Slight retrolisthesis is present at C5-6. Degenerative anterolisthesis is present T1-2. Reversal of the normal cervical lordosis is noted. Vertebrae: Decreased T1 marrow signal is present in the lower cervical spine, particularly C7 and T1. Vertebral body heights are maintained. Cord: T2 signal hyperintensity is present in the spinal cord from C3-4 through C5. Posterior Fossa, vertebral  arteries, paraspinal tissues: Visualized brainstem and cerebellum are normal. Diffuse edema is present in the occipital paraspinous musculature bilaterally. No discrete abscess is present. Extensive prevertebral edema extends from the clivus to C6. No abscess is present. Mild edema is present in the posterior paraspinous muscles, left greater than right. Disc levels: a abnormal fluid present in the dorsal spinal canal extending from foramen magnum to the T2 level. Severe disc disease present at C5-6 and C6-7. The cord is compressed at these levels. Mild disc disease present at C3-4 and C4-5 with some cord compression at these levels. IMPRESSION: 1. Abnormal fluid in the dorsal spinal canal extending from foramen magnum to the T2 level. Given the other  inflammatory changes and concern, this most likely represents epidural infection. There is some fluid ventral to the spinal cord at C2. 2. Diffuse prevertebral edema from the clivus to C6 is also concerning for infection. 3. The cord is compressed without abnormal signal suggesting edema. 4. Mild edema in the posterior paraspinous muscles, left greater than right. Edema is most significant within the paraspinous muscles just below the occiput. No focal abscess is present. 5. Severe disc disease at C5-6 and C6-7 with moderate central canal stenosis at both levels. 6. Mild disc disease at C3-4 and C4-5 with some cord compression at these levels. 7. Abnormal marrow signal in the lower cervical spine, particularly C7 and T1. This is nonspecific and can be seen in the setting of anemia, smoking, or obesity. Electronically Signed   By: San Morelle M.D.   On: 01/04/2020 18:51   MR THORACIC SPINE WO CONTRAST  Result Date: 01/05/2020 CLINICAL DATA:  Epidural fluid collection.  Back pain and neck pain. EXAM: MRI THORACIC SPINE WITHOUT CONTRAST TECHNIQUE: Multiplanar, multisequence MR imaging of the thoracic spine was performed. No intravenous contrast was  administered. COMPARISON:  None. FINDINGS: Alignment:  Physiologic. Vertebrae: No fracture, evidence of discitis, or bone lesion. Cord: Dorsal epidural fluid collection extending from the cervical spine to T2 level is again seen. There is no continuation of the collection below the T2 level. Spinal cord signal is normal. Paraspinal and other soft tissues: Negative. Disc levels: At T2-3, there is a small right subarticular disc protrusion. There is no other thoracic disc herniation. No spinal canal or neural foraminal stenosis. IMPRESSION: 1. Dorsal epidural fluid collection extending from the cervical spine to T2 level, consistent with epidural abscess. No continuation below the T2 level. 2. No thoracic spinal canal or neural foraminal stenosis. Electronically Signed   By: Ulyses Jarred M.D.   On: 01/05/2020 19:42        Scheduled Meds: . amLODipine  10 mg Oral QHS  . atorvastatin  20 mg Oral QHS  . Chlorhexidine Gluconate Cloth  6 each Topical Q0600  . insulin aspart  0-6 Units Subcutaneous Q4H  . metoprolol tartrate  50 mg Oral BID  . vancomycin variable dose per unstable renal function (pharmacist dosing)   Does not apply See admin instructions   Continuous Infusions:   LOS: 2 days     Cordelia Poche, MD [facility] 01/06/2020, 8:50 AM  If 7PM-7AM, please contact night-coverage [email or web address]

## 2020-01-06 NOTE — Progress Notes (Signed)
pt states wants to get off HD machine pt instructed HD tx scheduled for 3.5hr in order to clean his blood adequately. pt states I don't care take me off i'm going to call someone to help me. Pt states don't talk to me anymore take me off. I instructed Patient I am preparing to take him off the HD machine presently AMA.

## 2020-01-06 NOTE — Progress Notes (Signed)
Patient requested that his family pray with him prior to surgery.  Multiple attempts made to reach daughters without success.  Nurses prayed with patient at bedside.    Patient was not able to communicate what procedure he was having done today, therefore he could not sign consent.   Daughters, [name] and Debroah Baller returned call.  Dr. Kathyrn Sheriff at bedside and explained procedure to patient and daughters.  Daughters and patient verbally consented to procedure.   Phone consent obtained from daughter, Debroah Baller and signed by 2 RNs.

## 2020-01-06 NOTE — Brief Op Note (Signed)
01/06/2020  5:00 PM  PATIENT:  [name].  69 y.o. male  PRE-OPERATIVE DIAGNOSIS:  CERVICAL EPIDURAL ABSCESS  POST-OPERATIVE DIAGNOSIS:  CERVICAL EPIDURAL ABSCESS  PROCEDURE:  Procedure(s) with comments: CERVICAL FOUR- THORACIC TWO LAMINECTOMY AND FUSION (N/A) - CERVICAL FOUR- THORACIC TWO LAMINECTOMY AND FUSION  SURGEON:  Surgeon(s) and Role:    * Consuella Lose, MD - Primary  PHYSICIAN ASSISTANT: Ferne Reus, PA-C  ANESTHESIA:   general  EBL:  450 mL   BLOOD ADMINISTERED:1 CC PRBC  DRAINS: none   LOCAL MEDICATIONS USED:  MARCAINE    and BUPIVICAINE   SPECIMEN:  Source of Specimen:  posterior neck  DISPOSITION OF SPECIMEN:  gram stain, cultures  COUNTS:  YES  TOURNIQUET:  * No tourniquets in log *  DICTATION: .Note written in [place]: has active admission order  PATIENT DISPOSITION:  PACU - hemodynamically stable.   Delay start of Pharmacological VTE agent (>24hrs) due to surgical blood loss or risk of bleeding: yes

## 2020-01-06 NOTE — Progress Notes (Addendum)
Pharmacy Antibiotic Note  [name]. is a 69 y.o. male admitted on 01/04/2020 with MRSA bacteremia/epidural abscess of C spine. Pharmacy has been consulted for vancomycin dosing. Patient is ESRD with HD on MWF.   Mr. Clingerman received a 2 gm load of Vancomycin yesterday and then about 2-2.5 hours of dialysis last night, which is about half a normal session. Given this information- His post-HD level would've been around 18-19. Given that he is going to surgery today, I will give him a lower dose of 500 mg once to achieve serum concentrations around 25.   Plan: - Vancomycin 500 mg once today - F/U surgery, further HD sessions  - Consider Vanc random level in the AM pending surgery to assess current vancomycin state  Weight: 90 kg (198 lb 6.6 oz)  Temp (24hrs), Avg:98.6 F (37 C), Min:98 F (36.7 C), Max:99.3 F (37.4 C)  Recent Labs  Lab 12/30/19 2212 01/03/20 1524 01/04/20 1213 01/04/20 1400 01/05/20 0502 01/06/20 0258  WBC 15.0* 25.8* 25.3*  --  24.1* 25.8*  CREATININE 14.32* 14.34* 15.45*  --  16.74* 12.57*  LATICACIDVEN  --   --   --  1.7  --   --     Estimated Creatinine Clearance: 6.1 mL/min (A) (by C-G formula based on SCr of 12.57 mg/dL (H)).    No Known Allergies  Antimicrobials this admission: vancomycin 8/4 >>   Dose adjustments this admission:  Microbiology results: 8/3 BCx: 4/4 MRSA 8/3 UCx: No growth    Thank you for allowing pharmacy to be a part of this patient's care.  Jimmy Footman, PharmD, BCPS, BCIDP Infectious Diseases Clinical Pharmacist Phone: [phone] 01/06/2020 10:06 AM

## 2020-01-06 NOTE — Plan of Care (Signed)
°  Problem: Coping: °Goal: Level of anxiety will decrease °Outcome: Progressing °  °

## 2020-01-07 ENCOUNTER — Inpatient Hospital Stay (HOSPITAL_COMMUNITY): Payer: No Typology Code available for payment source

## 2020-01-07 ENCOUNTER — Encounter (HOSPITAL_COMMUNITY): Payer: Self-pay | Admitting: Neurosurgery

## 2020-01-07 DIAGNOSIS — R7881 Bacteremia: Secondary | ICD-10-CM | POA: Diagnosis not present

## 2020-01-07 DIAGNOSIS — T827XXA Infection and inflammatory reaction due to other cardiac and vascular devices, implants and grafts, initial encounter: Secondary | ICD-10-CM | POA: Diagnosis present

## 2020-01-07 DIAGNOSIS — I10 Essential (primary) hypertension: Secondary | ICD-10-CM | POA: Diagnosis not present

## 2020-01-07 DIAGNOSIS — G062 Extradural and subdural abscess, unspecified: Secondary | ICD-10-CM | POA: Diagnosis not present

## 2020-01-07 DIAGNOSIS — N186 End stage renal disease: Secondary | ICD-10-CM | POA: Diagnosis not present

## 2020-01-07 HISTORY — PX: IR REMOVAL TUN CV CATH W/O FL: IMG2289

## 2020-01-07 LAB — ECHOCARDIOGRAM COMPLETE
Area-P 1/2: 3.06 cm2
Calc EF: 72 %
Height: 71.5 in
S' Lateral: 3.1 cm
Single Plane A2C EF: 66.6 %
Single Plane A4C EF: 75.1 %
Weight: 3382.74 oz

## 2020-01-07 LAB — GLUCOSE, CAPILLARY
Glucose-Capillary: 197 mg/dL — ABNORMAL HIGH (ref 70–99)
Glucose-Capillary: 205 mg/dL — ABNORMAL HIGH (ref 70–99)
Glucose-Capillary: 216 mg/dL — ABNORMAL HIGH (ref 70–99)
Glucose-Capillary: 295 mg/dL — ABNORMAL HIGH (ref 70–99)
Glucose-Capillary: 333 mg/dL — ABNORMAL HIGH (ref 70–99)

## 2020-01-07 LAB — CBC
HCT: 25 % — ABNORMAL LOW (ref 39.0–52.0)
Hemoglobin: 8.7 g/dL — ABNORMAL LOW (ref 13.0–17.0)
MCH: 25.9 pg — ABNORMAL LOW (ref 26.0–34.0)
MCHC: 34.8 g/dL (ref 30.0–36.0)
MCV: 74.4 fL — ABNORMAL LOW (ref 80.0–100.0)
Platelets: 223 10*3/uL (ref 150–400)
RBC: 3.36 MIL/uL — ABNORMAL LOW (ref 4.22–5.81)
RDW: 16.7 % — ABNORMAL HIGH (ref 11.5–15.5)
WBC: 16.8 10*3/uL — ABNORMAL HIGH (ref 4.0–10.5)
nRBC: 0 % (ref 0.0–0.2)

## 2020-01-07 LAB — BASIC METABOLIC PANEL
Anion gap: 21 — ABNORMAL HIGH (ref 5–15)
BUN: 130 mg/dL — ABNORMAL HIGH (ref 8–23)
CO2: 16 mmol/L — ABNORMAL LOW (ref 22–32)
Calcium: 8.4 mg/dL — ABNORMAL LOW (ref 8.9–10.3)
Chloride: 93 mmol/L — ABNORMAL LOW (ref 98–111)
Creatinine, Ser: 14.21 mg/dL — ABNORMAL HIGH (ref 0.61–1.24)
GFR calc Af Amer: 4 mL/min — ABNORMAL LOW (ref >60)
GFR calc non Af Amer: 3 mL/min — ABNORMAL LOW (ref >60)
Glucose, Bld: 353 mg/dL — ABNORMAL HIGH (ref 70–99)
Potassium: 5.3 mmol/L — ABNORMAL HIGH (ref 3.5–5.1)
Sodium: 130 mmol/L — ABNORMAL LOW (ref 135–145)

## 2020-01-07 LAB — CULTURE, BLOOD (ROUTINE X 2)
Special Requests: ADEQUATE
Special Requests: ADEQUATE

## 2020-01-07 LAB — VANCOMYCIN, RANDOM: Vancomycin Rm: 23

## 2020-01-07 LAB — MAGNESIUM: Magnesium: 2.3 mg/dL (ref 1.7–2.4)

## 2020-01-07 MED ORDER — VANCOMYCIN HCL IN DEXTROSE 1-5 GM/200ML-% IV SOLN
INTRAVENOUS | Status: AC
  Administered 2020-01-07: 1000 mg via INTRAVENOUS
  Filled 2020-01-07: qty 200

## 2020-01-07 MED ORDER — CHLORHEXIDINE GLUCONATE 4 % EX LIQD
CUTANEOUS | Status: AC
  Filled 2020-01-07: qty 15

## 2020-01-07 MED ORDER — LIDOCAINE HCL 1 % IJ SOLN
INTRAMUSCULAR | Status: AC
  Filled 2020-01-07: qty 20

## 2020-01-07 MED ORDER — HYDROMORPHONE HCL 1 MG/ML IJ SOLN
0.5000 mg | Freq: Once | INTRAMUSCULAR | Status: AC
  Administered 2020-01-07: 0.5 mg via INTRAVENOUS
  Filled 2020-01-07: qty 1

## 2020-01-07 MED ORDER — VANCOMYCIN HCL IN DEXTROSE 1-5 GM/200ML-% IV SOLN: 1000.0000 mg | INTRAVENOUS | Status: AC

## 2020-01-07 MED ORDER — ACETAMINOPHEN 325 MG PO TABS
650.0000 mg | ORAL_TABLET | Freq: Four times a day (QID) | ORAL | Status: DC | PRN
  Administered 2020-01-07 – 2020-01-10 (×6): 650 mg via ORAL
  Filled 2020-01-07 (×8): qty 2

## 2020-01-07 MED ORDER — INSULIN DETEMIR 100 UNIT/ML ~~LOC~~ SOLN
20.0000 [IU] | Freq: Every day | SUBCUTANEOUS | Status: DC
  Administered 2020-01-07 – 2020-01-11 (×3): 20 [IU] via SUBCUTANEOUS
  Filled 2020-01-07 (×7): qty 0.2

## 2020-01-07 NOTE — Progress Notes (Signed)
Orthopedic Tech Progress Note Patient Details:  [name]. 01/04/1951 [id number] Called and spoke with RN patient doesn't have on collar. Told him that he could order a [place]  from materials.  Patient ID: Rodney Reasons., male   DOB: May 18, 1951, 69 y.o.   MRN: [id number]   [name] 01/07/2020, 8:05 AM

## 2020-01-07 NOTE — Progress Notes (Signed)
Successful removal of right IJ tunneled HD catheter.   After obtaining consent and performing a time-out, the right upper chest was prepped and draped in the normal sterile fashion. The heparin was removed from both ports. 1% lidocaine was used for local anesthesia. Using gentle blunt dissection the cuff of the catheter was exposed and the catheter was removed in its entirety. Pressure was held until hemostasis was obtained. A sterile dressing was applied. The patient tolerated the procedure well with no immediate complications.   Soyla Dryer, [place] [phone] 01/07/2020, 5:01 PM

## 2020-01-07 NOTE — Progress Notes (Signed)
Pharmacy Antibiotic Note  [name]. is a 70 y.o. male admitted on 01/04/2020 with MRSA bacteremia/epidural abscess of C spine. Pharmacy has been consulted for vancomycin dosing. Patient is ESRD with HD on MWF.   [name] went to surgery yesterday for a laminectomy and fusion. His random level this morning returned at 23, which is within his pre-HD goal. If [name] receives a full dialysis session today, then his level will be reduced to around 13.  Will plan 1 gm after dialysis today.   Plan: - Vancomycin 1000 mg after HD today       (Spoke with HD RN) - Monitor HD schedule and schedule doses as appropriate.     Height: 5' 11.5" (181.6 cm) Weight: 90 kg (198 lb 6.6 oz) IBW/kg (Calculated) : 76.45  Temp (24hrs), Avg:98.9 F (37.2 C), Min:98.1 F (36.7 C), Max:100.3 F (37.9 C)  Recent Labs  Lab 01/03/20 1524 01/04/20 1213 01/04/20 1400 01/05/20 0502 01/06/20 0258 01/07/20 0720  WBC 25.8* 25.3*  --  24.1* 25.8* 16.8*  CREATININE 14.34* 15.45*  --  16.74* 12.57* 14.21*  LATICACIDVEN  --   --  1.7  --   --   --   VANCORANDOM  --   --   --   --   --  23    Estimated Creatinine Clearance: 5.4 mL/min (A) (by C-G formula based on SCr of 14.21 mg/dL (H)).    No Known Allergies  Antimicrobials this admission: vancomycin 8/4 >>   Dose adjustments this admission:  Microbiology results: 8/3 BCx: 4/4 MRSA 8/3 UCx: No growth  8/6 Repeat BCx: in process    Thank you for allowing pharmacy to be a part of this patient's care.  Jimmy Footman, PharmD, BCPS, BCIDP Infectious Diseases Clinical Pharmacist Phone: [phone] 01/07/2020 10:58 AM

## 2020-01-07 NOTE — Procedures (Signed)
I was present at this dialysis session. I have reviewed the session itself and made appropriate changes.   Vital signs in last 24 hours:  Temp:  [98.1 F (36.7 C)-100.3 F (37.9 C)] 98.2 F (36.8 C) (08/06 0846) Pulse Rate:  [75-97] 84 (08/06 1100) Resp:  [15-31] 25 (08/06 0930) BP: (128-167)/(62-79) 128/68 (08/06 1100) SpO2:  [94 %-100 %] 99 % (08/06 0846) Arterial Line BP: (178-187)/(64) 187/64 (08/05 1715) Weight:  [90 kg-95.9 kg] 95.9 kg (08/06 0846) Weight change: 0 kg Filed Weights   01/06/20 0130 01/06/20 1247 01/07/20 0846  Weight: 90 kg 90 kg 95.9 kg    Recent Labs  Lab 01/07/20 0720  NA 130*  K 5.3*  CL 93*  CO2 16*  GLUCOSE 353*  BUN 130*  CREATININE 14.21*  CALCIUM 8.4*    Recent Labs  Lab 01/04/20 1213 01/04/20 1213 01/05/20 0502 01/05/20 0502 01/06/20 0258 01/06/20 0258 01/06/20 1542 01/06/20 1651 01/07/20 0720  WBC 25.3*   < > 24.1*  --  25.8*  --   --   --  16.8*  NEUTROABS 22.5*  --   --   --   --   --   --   --   --   HGB 10.0*   < > 8.8*   < > 8.7*   < > 7.5* 9.2* 8.7*  HCT 28.6*   < > 25.3*   < > 24.9*   < > 22.0* 27.0* 25.0*  MCV 75.5*   < > 74.4*  --  73.5*  --   --   --  74.4*  PLT 333   < > 266  --  290  --   --   --  223   < > = values in this interval not displayed.    Scheduled Meds: . amLODipine  10 mg Oral QHS  . atorvastatin  20 mg Oral QHS  . Chlorhexidine Gluconate Cloth  6 each Topical Q0600  . Chlorhexidine Gluconate Cloth  6 each Topical Q0600  . insulin aspart  0-6 Units Subcutaneous Q4H  . metoprolol tartrate  50 mg Oral BID  . vancomycin variable dose per unstable renal function (pharmacist dosing)   Does not apply See admin instructions   Continuous Infusions: . sodium chloride 10 mL/hr at 01/06/20 1304  .  ceFAZolin (ANCEF) IV    . vancomycin 1,000 mg (01/07/20 1142)   PRN Meds:.acetaminophen, hydrALAZINE, ondansetron **OR** ondansetron (ZOFRAN) IV, polyethylene glycol, senna-docusate    Dialysis  Orders: Center:[facility] MWF. YCX44 kgHD Bath 2K/2CaTime 4.25 hrsHeparin none. AccessRIJ TDC and LUE AVFBFR 400DFR 800  Calcitriol 1.70mcg po/HD Micera 150 mcg IV q 2 weeks last given on 12/29/19  Assessment/Plan: 1. Epidural abscess - Etiology unclear. IR unable to aspirate.  MRI showed abscess extending from C4-T2 with cord compression.  s/p C4-T2 laminectomy and fusion for decompression 01/06/20 with improved pain.  2. MRSA bacteremia - ID consulted - recommend Vanc, to have TTE.  Awaiting results on repeat BC and TTE for further management.  Has TDC in place.  currently using LU AVF for dialysis and will order [facility] to be removed later today.  3. Weakness all 4 extremities - has epidural abscess with cervical stenosis from C4-T2.  MRI of lumbar spine with severe spinal stenosis at L4-5 as well as some subdural fluid collection at %12-L3 with mass effect and could possibly be infected material.  Plan per Neurosurgery. 4. ESRD - on HD MWF. Shortened treatment overnight, completed 1.23  hrs.  Seen on HD today.   5. Anemia of CKD- Hgb 8.7.  ESA recently dosed.  Follow trends, transfuse prn.  6. Secondary hyperparathyroidism - Ca at goal. Will check phos. Continue VDRA and binders when no longer NPO.  7. HTN/volume - BP elevated.  Continue home meds when no longer NPO.  Volume status stable.  UF as tolerated.  8. Nutrition - currently NPO.  Renal diet w/fluid restrictions once advanced.   Donetta Potts,  MD 01/07/2020, 11:43 AM

## 2020-01-07 NOTE — Plan of Care (Signed)
  Problem: Education: Goal: Knowledge of General Education information will improve Description: Including pain rating scale, medication(s)/side effects and non-pharmacologic comfort measures Outcome: Progressing   Problem: Clinical Measurements: Goal: Will remain free from infection Outcome: Progressing   Problem: Activity: Goal: Risk for activity intolerance will decrease Outcome: Progressing   

## 2020-01-07 NOTE — Progress Notes (Addendum)
PROGRESS NOTE    [name].  YKD:[id number] DOB: [birth year] DOA: 01/04/2020 PCP: [facility]   Brief Narrative: [name]. is a 69 y.o. malewithhistory of ESRD on hemodialysis on Monday Wednesday Friday, hypertension, diabetes mellitus type 2, anemia history of colon cancer status post resection. Patient presented secondary to worsening headache and back pain and found to have an epidural abscess with associated cord compression in addition to MRSA positive bacteremia. Vancomycin started and neurosurgery consulted for management.   Assessment & Plan:   Principal Problem:   MRSA bacteremia Active Problems:   TOBACCO ABUSE   OBSTRUCTIVE SLEEP APNEA   Colon cancer, status Post resection    HYPERTENSION   ESRD (end stage renal disease) ([place])   Epidural abscess   Acute encephalopathy   Sepsis ([place])   Sepsis Present on admission and secondary to bacteremia and epidural abscess. Associated leukocytosis which is currently stable. Afebrile. Patient started empirically on Vancomycin IV.  Cervical cord compression Epidural abscess Upper/lower extremity weakness Neurosurgery consulted on admission and patient is s/p  C4-T2 laminectomy and fusion for decompression on 8/5. -Neurosurgery recommendations: pending today -Follow wound culture -Antibiotics below  MRSA bacteremia Noted on admission blood cultures. Patient started on Vancomycin IV empirically. Infectious disease consulted -Infectious disease recommendations: Vancomycin/Ancef, Transthoracic Echocardiogram pending  ESRD on HD -Nephrology management  Anemia of chronic disease In setting of kidney disease. Stable.  Essential hypertension On amlodipine and metoprolol as an outpatient -Continue amlodipine and metoprolol  Diabetes mellitus, type 2 Patient is on Levemir 24 units qhs as an outpatient. Placed on SSI q4 hours while NPO -Continue SSI -Resume Levemir at dose of 20 units  qhs  History of colon cancer S/p resection.  Hyperlipidemia -Continue Lipitor   DVT prophylaxis: SCDs Code Status:   Code Status: Full Code Family Communication: Daughter on telephone Disposition Plan: Discharge pending continued workup and management of epidural abscess and bacteremia   Consultants:   Neurosurgery  Nephrology  Infectious disease  Procedures:   HEMODIALYSIS  CERVICAL FOUR- THORACIC TWO LAMINECTOMY AND FUSION (01/06/2020)  TRANSTHORACIC ECHOCARDIOGRAM (01/07/2020)  Antimicrobials:  Vancomycin  Ancef   Subjective: Patient seen in HD. Pain is better today. No concerns  Objective: Vitals:   01/07/20 1030 01/07/20 1100 01/07/20 1130 01/07/20 1200  BP: 133/70 128/68 (!) 144/82 139/74  Pulse: 84 84 83 85  Resp:    (!) 22  Temp:      TempSrc:      SpO2:      Weight:      Height:        Intake/Output Summary (Last 24 hours) at 01/07/2020 1249 Last data filed at 01/07/2020 1228 Gross per 24 hour  Intake 1889.63 ml  Output 3180 ml  Net -1290.37 ml   Filed Weights   01/06/20 0130 01/06/20 1247 01/07/20 0846  Weight: 90 kg 90 kg 95.9 kg    Examination:  General exam: Appears calm and comfortable Respiratory system: Clear to auscultation. Respiratory effort normal. Cardiovascular system: S1 & S2 heard, RRR. No murmurs, rubs, gallops or clicks. Gastrointestinal system: Abdomen is nondistended, soft and nontender. No organomegaly or masses felt. Normal bowel sounds heard. Central nervous system: Alert and oriented. No focal neurological deficits. Musculoskeletal: No edema. No calf tenderness. Neck collar Skin: No cyanosis. No rashes Psychiatry: Judgement and insight appear normal. Mood & affect appropriate.     Data Reviewed: I have personally reviewed following labs and imaging studies  CBC Lab Results  Component Value Date  WBC 16.8 (H) 01/07/2020   RBC 3.36 (L) 01/07/2020   HGB 8.7 (L) 01/07/2020   HCT 25.0 (L) 01/07/2020   MCV 74.4  (L) 01/07/2020   MCH 25.9 (L) 01/07/2020   PLT 223 01/07/2020   MCHC 34.8 01/07/2020   RDW 16.7 (H) 01/07/2020   LYMPHSABS 1.0 01/04/2020   MONOABS 1.5 (H) 01/04/2020   EOSABS 0.0 01/04/2020   BASOSABS 0.0 91/63/8466     Last metabolic panel Lab Results  Component Value Date   NA 130 (L) 01/07/2020   K 5.3 (H) 01/07/2020   CL 93 (L) 01/07/2020   CO2 16 (L) 01/07/2020   BUN 130 (H) 01/07/2020   CREATININE 14.21 (H) 01/07/2020   GLUCOSE 353 (H) 01/07/2020   GFRNONAA 3 (L) 01/07/2020   GFRAA 4 (L) 01/07/2020   CALCIUM 8.4 (L) 01/07/2020   PHOS 4.1 04/22/2012   PROT 9.1 (H) 01/04/2020   ALBUMIN 3.2 (L) 01/04/2020   BILITOT 1.1 01/04/2020   ALKPHOS 84 01/04/2020   AST 21 01/04/2020   ALT 20 01/04/2020   ANIONGAP 21 (H) 01/07/2020    CBG (last 3)  Recent Labs    01/06/20 1711 01/07/20 0019 01/07/20 0713  GLUCAP 197* 295* 333*     GFR: Estimated Creatinine Clearance: 5.9 mL/min (A) (by C-G formula based on SCr of 14.21 mg/dL (H)).  Coagulation Profile: Recent Labs  Lab 01/04/20 1213  INR 1.3*    Recent Results (from the past 240 hour(s))  Culture, blood (routine x 2)     Status: Abnormal   Collection Time: 01/04/20 12:13 PM   Specimen: BLOOD RIGHT HAND  Result Value Ref Range Status   Specimen Description   Final    BLOOD RIGHT HAND Performed at [facility] [address], [place], [place] [address]    Special Requests   Final    BOTTLES DRAWN AEROBIC AND ANAEROBIC Blood Culture adequate volume Performed at [facility] [address] [place], [place] [address]    Culture  Setup Time   Final    GRAM POSITIVE COCCI IN BOTH AEROBIC AND ANAEROBIC BOTTLES CRITICAL VALUE NOTED.  VALUE IS CONSISTENT WITH PREVIOUSLY REPORTED AND CALLED VALUE.    Culture (A)  Final    STAPHYLOCOCCUS AUREUS SUSCEPTIBILITIES PERFORMED ON PREVIOUS CULTURE WITHIN THE LAST 5 DAYS. Performed at [facility], [place] [address]  [place], [place] [address]    Report Status 01/07/2020 FINAL  Final  Culture, blood (routine x 2)     Status: Abnormal   Collection Time: 01/04/20 12:13 PM   Specimen: Site Not Specified; Blood  Result Value Ref Range Status   Specimen Description   Final    SITE NOT SPECIFIED Performed at [facility] [address], [place], [place] [address]    Special Requests   Final    BOTTLES DRAWN AEROBIC AND ANAEROBIC Blood Culture adequate volume Performed at [facility] [address] [place], [place] [address]    Culture  Setup Time   Final    GRAM POSITIVE COCCI IN BOTH AEROBIC AND ANAEROBIC BOTTLES Organism ID to follow CRITICAL RESULT CALLED TO, READ BACK BY AND VERIFIED WITH: D. WOFFORD PHARMD, AT 0745 01/05/20 BY Rush Landmark Performed at [facility], [place] [address], [place], [place] [address]    Culture METHICILLIN RESISTANT STAPHYLOCOCCUS AUREUS (A)  Final   Report Status 01/07/2020 FINAL  Final   Organism ID, Bacteria METHICILLIN RESISTANT STAPHYLOCOCCUS  AUREUS  Final      Susceptibility   Methicillin resistant staphylococcus aureus - MIC*    CIPROFLOXACIN >=8 RESISTANT Resistant     ERYTHROMYCIN >=8 RESISTANT Resistant     GENTAMICIN <=0.5 SENSITIVE Sensitive     OXACILLIN >=4 RESISTANT Resistant     TETRACYCLINE <=1 SENSITIVE Sensitive     VANCOMYCIN <=0.5 SENSITIVE Sensitive     TRIMETH/SULFA <=10 SENSITIVE Sensitive     CLINDAMYCIN <=0.25 SENSITIVE Sensitive     RIFAMPIN <=0.5 SENSITIVE Sensitive     Inducible Clindamycin NEGATIVE Sensitive     * METHICILLIN RESISTANT STAPHYLOCOCCUS AUREUS  Blood Culture ID Panel (Reflexed)     Status: Abnormal   Collection Time: 01/04/20 12:13 PM  Result Value Ref Range Status   Enterococcus faecalis NOT DETECTED NOT DETECTED Final   Enterococcus Faecium NOT DETECTED NOT DETECTED Final   Listeria monocytogenes NOT DETECTED NOT DETECTED Final   Staphylococcus species DETECTED (A) NOT DETECTED  Final    Comment: CRITICAL RESULT CALLED TO, READ BACK BY AND VERIFIED WITH: D. WOFFORD PHARMD, AT 0745 01/05/20 BY D. VANHOOK    Staphylococcus aureus (BCID) DETECTED (A) NOT DETECTED Final    Comment: Methicillin (oxacillin)-resistant Staphylococcus aureus (MRSA). MRSA is predictably resistant to beta-lactam antibiotics (except ceftaroline). Preferred therapy is vancomycin unless clinically contraindicated. Patient requires contact precautions if  hospitalized. CRITICAL RESULT CALLED TO, READ BACK BY AND VERIFIED WITH: D. WOFFORD PHARMD, AT 0745 01/05/20 BY D. VANHOOK    Staphylococcus epidermidis NOT DETECTED NOT DETECTED Final   Staphylococcus lugdunensis NOT DETECTED NOT DETECTED Final   Streptococcus species NOT DETECTED NOT DETECTED Final   Streptococcus agalactiae NOT DETECTED NOT DETECTED Final   Streptococcus pneumoniae NOT DETECTED NOT DETECTED Final   Streptococcus pyogenes NOT DETECTED NOT DETECTED Final   A.calcoaceticus-baumannii NOT DETECTED NOT DETECTED Final   Bacteroides fragilis NOT DETECTED NOT DETECTED Final   Enterobacterales NOT DETECTED NOT DETECTED Final   Enterobacter cloacae complex NOT DETECTED NOT DETECTED Final   Escherichia coli NOT DETECTED NOT DETECTED Final   Klebsiella aerogenes NOT DETECTED NOT DETECTED Final   Klebsiella oxytoca NOT DETECTED NOT DETECTED Final   Klebsiella pneumoniae NOT DETECTED NOT DETECTED Final   Proteus species NOT DETECTED NOT DETECTED Final   Salmonella species NOT DETECTED NOT DETECTED Final   Serratia marcescens NOT DETECTED NOT DETECTED Final   Haemophilus influenzae NOT DETECTED NOT DETECTED Final   Neisseria meningitidis NOT DETECTED NOT DETECTED Final   Pseudomonas aeruginosa NOT DETECTED NOT DETECTED Final   Stenotrophomonas maltophilia NOT DETECTED NOT DETECTED Final   Candida albicans NOT DETECTED NOT DETECTED Final   Candida auris NOT DETECTED NOT DETECTED Final   Candida glabrata NOT DETECTED NOT DETECTED Final    Candida krusei NOT DETECTED NOT DETECTED Final   Candida parapsilosis NOT DETECTED NOT DETECTED Final   Candida tropicalis NOT DETECTED NOT DETECTED Final   Cryptococcus neoformans/gattii NOT DETECTED NOT DETECTED Final   Meth resistant mecA/C and MREJ DETECTED (A) NOT DETECTED Final    Comment: CRITICAL RESULT CALLED TO, READ BACK BY AND VERIFIED WITH: D. WOFFORD PHARMD, AT 0745 01/05/20 BY Rush Landmark Performed at [facility], [address], [place], [place] [address]   SARS Coronavirus 2 by RT PCR (hospital order, performed in [facility] hospital lab) Nasopharyngeal Nasopharyngeal Swab     Status: None   Collection Time: 01/04/20  2:45 PM   Specimen: Nasopharyngeal Swab  Result Value Ref Range Status   SARS  Coronavirus 2 NEGATIVE NEGATIVE Final    Comment: (NOTE) SARS-CoV-2 target nucleic acids are NOT DETECTED.  The SARS-CoV-2 RNA is generally detectable in upper and lower respiratory specimens during the acute phase of infection. The lowest concentration of SARS-CoV-2 viral copies this assay can detect is 250 copies / mL. A negative result does not preclude SARS-CoV-2 infection and should not be used as the sole basis for treatment or other patient management decisions.  A negative result may occur with improper specimen collection / handling, submission of specimen other than nasopharyngeal swab, presence of viral mutation(s) within the areas targeted by this assay, and inadequate number of viral copies (<250 copies / mL). A negative result must be combined with clinical observations, patient history, and epidemiological information.  Fact Sheet for Patients:   StrictlyIdeas.no  Fact Sheet for Healthcare Providers: BankingDealers.co.za  This test is not yet approved or  cleared by the [place] FDA and has been authorized for detection and/or diagnosis of SARS-CoV-2 by FDA under an Emergency Use Authorization (EUA).   This EUA will remain in effect (meaning this test can be used) for the duration of the COVID-19 declaration under Section 564(b)(1) of the Act, 21 U.S.C. section 360bbb-3(b)(1), unless the authorization is terminated or revoked sooner.  Performed at [facility], [place] [address] [place], [place] [address]   Urine culture     Status: None   Collection Time: 01/04/20  9:20 PM   Specimen: Urine, Random  Result Value Ref Range Status   Specimen Description   Final    URINE, RANDOM Performed at [facility] [address] [place], [place] [address]    Special Requests   Final    NONE Performed at [facility], [place] [address], [place], [place] [address]    Culture   Final    NO GROWTH Performed at [facility], [place] [address] [place], [place] [address]    Report Status 01/05/2020 FINAL  Final  Aerobic/Anaerobic Culture (surgical/deep wound)     Status: None (Preliminary result)   Collection Time: 01/06/20  2:52 PM   Specimen: Soft Tissue, Other  Result Value Ref Range Status   Specimen Description WOUND  Final   Special Requests POSTOP CERVICAL LAMINECTOMY AND FUSION SITE  Final   Gram Stain   Final    NO WBC SEEN ABUNDANT GRAM POSITIVE COCCI Performed at [facility], [place] [address] [place], [place] [address]    Culture PENDING  Incomplete   Report Status PENDING  Incomplete        Radiology Studies: DG Cervical Spine 2-3 Views  Result Date: 01/06/2020 CLINICAL DATA:  Cervicothoracic laminectomy infusion EXAM: CERVICAL SPINE - 2-3 VIEW; DG C-ARM 1-60 MIN COMPARISON:  None. FLUOROSCOPY TIME:  Fluoroscopy Time:  10.8 seconds Radiation Exposure Index (if provided by the fluoroscopic device): 2.51 mGy Number of Acquired Spot Images: 2 FINDINGS: Posterior fusion at C4-T1 is noted. The inferior aspect is incompletely evaluated on the lateral film. IMPRESSION: Cervicothoracic fusion. Electronically Signed   By:  Inez Catalina M.D.   On: 01/06/2020 16:46   MR THORACIC SPINE WO CONTRAST  Result Date: 01/05/2020 CLINICAL DATA:  Epidural fluid collection.  Back pain and neck pain. EXAM: MRI THORACIC SPINE WITHOUT CONTRAST TECHNIQUE: Multiplanar, multisequence MR imaging of the thoracic spine was performed. No intravenous contrast was administered. COMPARISON:  None. FINDINGS: Alignment:  Physiologic. Vertebrae: No fracture, evidence of discitis, or bone lesion. Cord: Dorsal  epidural fluid collection extending from the cervical spine to T2 level is again seen. There is no continuation of the collection below the T2 level. Spinal cord signal is normal. Paraspinal and other soft tissues: Negative. Disc levels: At T2-3, there is a small right subarticular disc protrusion. There is no other thoracic disc herniation. No spinal canal or neural foraminal stenosis. IMPRESSION: 1. Dorsal epidural fluid collection extending from the cervical spine to T2 level, consistent with epidural abscess. No continuation below the T2 level. 2. No thoracic spinal canal or neural foraminal stenosis. Electronically Signed   By: Ulyses Jarred M.D.   On: 01/05/2020 19:42   MR LUMBAR SPINE WO CONTRAST  Result Date: 01/06/2020 CLINICAL DATA:  In stage renal disease patient on hemodialysis. Worsening headache and back pain. Assess for evidence of infection. Cervicothoracic dorsal epidural fluid collection. EXAM: MRI LUMBAR SPINE WITHOUT CONTRAST TECHNIQUE: Multiplanar, multisequence MR imaging of the lumbar spine was performed. No intravenous contrast was administered. COMPARISON:  Cervical study done 2 days ago. Thoracic study done yesterday. FINDINGS: Segmentation:  5 lumbar type vertebral bodies assumed. Alignment:  2 mm degenerative anterolisthesis L2-3, L3-4 and L4-5. Vertebrae: No fracture or primary bone lesion. No finding to suggest discitis or osteomyelitis. See below regarding facet arthropathy. Conus medullaris and cauda equina: Conus extends  to the T12-L1 level. There is what appears to be a subdural fluid collection at the posterior left quadrant extending from T12-L1 to L2-3, with maximal size on the axial images 7 x 11 mm in cross-sectional diameter. This causes mass effect upon the neural structures, crowding them anteriorly. Additionally, there is probably some epidural material posteriorly at L3 to L4. There may be some subdural component of this as well. Paraspinal and other soft tissues: Nonspecific edematous change of the posterior para spinous musculature, right worse than left. See below. Disc levels: L1-2: No significant disc pathology. No canal stenosis. See above regarding subdural collection. L2-3: Facet arthropathy with 2 mm of anterolisthesis. Bulging of the disc. Mild multifactorial spinal stenosis. Some facet edema on both sides. This does not appear pronounced, but facet joint infection is not excluded. L3-4: Facet arthropathy right more than left with 2 mm of anterolisthesis. Bulging of the disc. Moderate to severe multifactorial spinal stenosis that could cause neural compression. Facet arthropathy may simply be degenerative, but facet infection is not excluded, particularly on the right. L4-5: Facet arthropathy with 2 mm of anterolisthesis. Bulging of the disc. Severe multifactorial spinal stenosis that could cause neural compression. Facet edema right more than left that could be degenerative or potentially relate to infection. L5-S1: Mild bulging of the disc. Mild facet osteoarthritis. No apparent compressive stenosis. IMPRESSION: 1. There is what appears to be a subdural fluid collection at the posterior left quadrant extending from T12-L1 to L2-3, with maximal diameter on the axial images 7 x 11 mm in cross-sectional diameter. This causes mass effect upon the neural structures, crowding them anteriorly. Additionally, there is probably some epidural material posteriorly at L3 to L4. This could be a small subdural component of  this as well. This could be infected material. 2. 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 There is 2 mm of anterolisthesis at L2-3, L3-4 and L4-5. There is disc degeneration with bulging of the discs. There is moderate to severe multifactorial spinal stenosis at L3-4 and severe spinal stenosis at L4-5 that could cause neural compression. Facet joint edema at L2-3, L3-4 and L4-5 could be degenerative or potentially relate  to infection. The single most suspicious looking facet joint is on the right at L3-4. Additionally, the posterior para spinous soft tissue structures show edematous change which could be inflammatory. Electronically Signed   By: Nelson Chimes M.D.   On: 01/06/2020 11:25   DG C-Arm 1-60 Min  Result Date: 01/06/2020 CLINICAL DATA:  Cervicothoracic laminectomy infusion EXAM: CERVICAL SPINE - 2-3 VIEW; DG C-ARM 1-60 MIN COMPARISON:  None. FLUOROSCOPY TIME:  Fluoroscopy Time:  10.8 seconds Radiation Exposure Index (if provided by the fluoroscopic device): 2.51 mGy Number of Acquired Spot Images: 2 FINDINGS: Posterior fusion at C4-T1 is noted. The inferior aspect is incompletely evaluated on the lateral film. IMPRESSION: Cervicothoracic fusion. Electronically Signed   By: Inez Catalina M.D.   On: 01/06/2020 16:46        Scheduled Meds: . amLODipine  10 mg Oral QHS  . atorvastatin  20 mg Oral QHS  . Chlorhexidine Gluconate Cloth  6 each Topical Q0600  . Chlorhexidine Gluconate Cloth  6 each Topical Q0600  . insulin aspart  0-6 Units Subcutaneous Q4H  . metoprolol tartrate  50 mg Oral BID  . vancomycin variable dose per unstable renal function (pharmacist dosing)   Does not apply See admin instructions   Continuous Infusions: . sodium chloride 10 mL/hr at 01/06/20 1304  .  ceFAZolin (ANCEF) IV       LOS: 3 days     Cordelia Poche, MD [facility] 01/07/2020, 12:49 PM  If 7PM-7AM, please contact night-coverage [email or web address]

## 2020-01-07 NOTE — CV Procedure (Signed)
2D echo attempted, but patient went to dialysis. Will try later

## 2020-01-07 NOTE — Progress Notes (Signed)
  Echocardiogram 2D Echocardiogram has been performed.  [name] 01/07/2020, 3:07 PM

## 2020-01-07 NOTE — Progress Notes (Signed)
[name] for [facility]ious Disease  Date of Admission:  01/04/2020     Total days of antibiotics 3         ASSESSMENT:  [name] is POD #1 from C4-T2 laminectomy and fusion for epidural abscess complicated with MRSA bacteremia. Seen in dialysis today. Repeat blood cultures obtained this morning and are pending. TTE pending with likely need for TEE  Will need right temporary dialysis catheter removed for line holiday given MRSA bacteremia when able and will defer this to nephrology for timing. Continue current dose of vancomycin. Consider adding rifampin with new hardware placement. Will need prolonged course of antibiotics with end date to be established once cultures are clear.   PLAN:  1. Continue current dose of vancomycin with dialysis. 2. Monitor cultures for clearance of bacteremia. 3. Will need line holiday of right temporary dialysis cathter.  4. TTE pending and likely need TEE.  5. Surgical care per neurosurgery.    Principal Problem:   MRSA bacteremia Active Problems:   TOBACCO ABUSE   OBSTRUCTIVE SLEEP APNEA   Colon cancer, status Post resection    HYPERTENSION   ESRD (end stage renal disease) ([place])   Epidural abscess   Acute encephalopathy   Sepsis ([place])   . amLODipine  10 mg Oral QHS  . atorvastatin  20 mg Oral QHS  . Chlorhexidine Gluconate Cloth  6 each Topical Q0600  . Chlorhexidine Gluconate Cloth  6 each Topical Q0600  . insulin aspart  0-6 Units Subcutaneous Q4H  . metoprolol tartrate  50 mg Oral BID  . vancomycin variable dose per unstable renal function (pharmacist dosing)   Does not apply See admin instructions    SUBJECTIVE:  Seen in dialysis. Elevated temperature of 100.3 overnight with no acute events. Having some neck pain but feeling okay.   No Known Allergies   Review of Systems: Review of Systems  Constitutional: Negative for chills, fever and weight loss.  Respiratory: Negative for cough, shortness of breath and wheezing.     Cardiovascular: Negative for chest pain and leg swelling.  Gastrointestinal: Negative for abdominal pain, constipation, diarrhea, nausea and vomiting.  Musculoskeletal: Positive for neck pain.  Skin: Negative for rash.      OBJECTIVE: Vitals:   01/07/20 0930 01/07/20 1000 01/07/20 1030 01/07/20 1100  BP: (!) 144/68 140/62 133/70 128/68  Pulse: 80 85 84 84  Resp: (!) 25     Temp:      TempSrc:      SpO2:      Weight:      Height:       Body mass index is 29.08 kg/m.  Physical Exam Constitutional:      General: He is not in acute distress.    Appearance: He is well-developed.     Interventions: Cervical collar in place.     Comments: Lying in bed with head of bed slightly elevated; pleasant.   Cardiovascular:     Rate and Rhythm: Normal rate and regular rhythm.     Heart sounds: Normal heart sounds.     Comments: Temporary dialysis catheter in right upper chest.  Pulmonary:     Effort: Pulmonary effort is normal.     Breath sounds: Normal breath sounds.  Skin:    General: Skin is warm and dry.  Neurological:     Mental Status: He is alert and oriented to person, place, and time.     Lab Results Lab Results  Component Value Date  WBC 16.8 (H) 01/07/2020   HGB 8.7 (L) 01/07/2020   HCT 25.0 (L) 01/07/2020   MCV 74.4 (L) 01/07/2020   PLT 223 01/07/2020    Lab Results  Component Value Date   CREATININE 14.21 (H) 01/07/2020   BUN 130 (H) 01/07/2020   NA 130 (L) 01/07/2020   K 5.3 (H) 01/07/2020   CL 93 (L) 01/07/2020   CO2 16 (L) 01/07/2020    Lab Results  Component Value Date   ALT 20 01/04/2020   AST 21 01/04/2020   ALKPHOS 84 01/04/2020   BILITOT 1.1 01/04/2020     Microbiology: Recent Results (from the past 240 hour(s))  Culture, blood (routine x 2)     Status: Abnormal   Collection Time: 01/04/20 12:13 PM   Specimen: BLOOD RIGHT HAND  Result Value Ref Range Status   Specimen Description   Final    BLOOD RIGHT HAND Performed at [facility], [place] [address], [place], [place] [address]    Special Requests   Final    BOTTLES DRAWN AEROBIC AND ANAEROBIC Blood Culture adequate volume Performed at [facility] [address] [place], [place] [address]    Culture  Setup Time   Final    GRAM POSITIVE COCCI IN BOTH AEROBIC AND ANAEROBIC BOTTLES CRITICAL VALUE NOTED.  VALUE IS CONSISTENT WITH PREVIOUSLY REPORTED AND CALLED VALUE.    Culture (A)  Final    STAPHYLOCOCCUS AUREUS SUSCEPTIBILITIES PERFORMED ON PREVIOUS CULTURE WITHIN THE LAST 5 DAYS. Performed at [facility], [place] [address] [place], [place] [address]    Report Status 01/07/2020 FINAL  Final  Culture, blood (routine x 2)     Status: Abnormal   Collection Time: 01/04/20 12:13 PM   Specimen: Site Not Specified; Blood  Result Value Ref Range Status   Specimen Description   Final    SITE NOT SPECIFIED Performed at [facility] [address] [place], [place] [address]    Special Requests   Final    BOTTLES DRAWN AEROBIC AND ANAEROBIC Blood Culture adequate volume Performed at [facility] [address] [place], [place] [address]    Culture  Setup Time   Final    GRAM POSITIVE COCCI IN BOTH AEROBIC AND ANAEROBIC BOTTLES Organism ID to follow CRITICAL RESULT CALLED TO, READ BACK BY AND VERIFIED WITH: D. WOFFORD PHARMD, AT 0745 01/05/20 BY Rush Landmark Performed at [facility], [place] [address], [place], [place] [address]    Culture METHICILLIN RESISTANT STAPHYLOCOCCUS AUREUS (A)  Final   Report Status 01/07/2020 FINAL  Final   Organism ID, Bacteria METHICILLIN RESISTANT STAPHYLOCOCCUS AUREUS  Final      Susceptibility   Methicillin resistant staphylococcus aureus - MIC*    CIPROFLOXACIN >=8 RESISTANT Resistant     ERYTHROMYCIN >=8 RESISTANT Resistant     GENTAMICIN <=0.5 SENSITIVE Sensitive     OXACILLIN >=4 RESISTANT Resistant     TETRACYCLINE <=1 SENSITIVE Sensitive      VANCOMYCIN <=0.5 SENSITIVE Sensitive     TRIMETH/SULFA <=10 SENSITIVE Sensitive     CLINDAMYCIN <=0.25 SENSITIVE Sensitive     RIFAMPIN <=0.5 SENSITIVE Sensitive     Inducible Clindamycin NEGATIVE Sensitive     * METHICILLIN RESISTANT STAPHYLOCOCCUS AUREUS  Blood Culture ID Panel (Reflexed)     Status: Abnormal   Collection Time: 01/04/20 12:13 PM  Result Value Ref Range Status   Enterococcus faecalis NOT DETECTED NOT DETECTED Final  Enterococcus Faecium NOT DETECTED NOT DETECTED Final   Listeria monocytogenes NOT DETECTED NOT DETECTED Final   Staphylococcus species DETECTED (A) NOT DETECTED Final    Comment: CRITICAL RESULT CALLED TO, READ BACK BY AND VERIFIED WITH: D. WOFFORD PHARMD, AT 0745 01/05/20 BY D. VANHOOK    Staphylococcus aureus (BCID) DETECTED (A) NOT DETECTED Final    Comment: Methicillin (oxacillin)-resistant Staphylococcus aureus (MRSA). MRSA is predictably resistant to beta-lactam antibiotics (except ceftaroline). Preferred therapy is vancomycin unless clinically contraindicated. Patient requires contact precautions if  hospitalized. CRITICAL RESULT CALLED TO, READ BACK BY AND VERIFIED WITH: D. WOFFORD PHARMD, AT 0745 01/05/20 BY D. VANHOOK    Staphylococcus epidermidis NOT DETECTED NOT DETECTED Final   Staphylococcus lugdunensis NOT DETECTED NOT DETECTED Final   Streptococcus species NOT DETECTED NOT DETECTED Final   Streptococcus agalactiae NOT DETECTED NOT DETECTED Final   Streptococcus pneumoniae NOT DETECTED NOT DETECTED Final   Streptococcus pyogenes NOT DETECTED NOT DETECTED Final   A.calcoaceticus-baumannii NOT DETECTED NOT DETECTED Final   Bacteroides fragilis NOT DETECTED NOT DETECTED Final   Enterobacterales NOT DETECTED NOT DETECTED Final   Enterobacter cloacae complex NOT DETECTED NOT DETECTED Final   Escherichia coli NOT DETECTED NOT DETECTED Final   Klebsiella aerogenes NOT DETECTED NOT DETECTED Final   Klebsiella oxytoca NOT DETECTED NOT DETECTED Final    Klebsiella pneumoniae NOT DETECTED NOT DETECTED Final   Proteus species NOT DETECTED NOT DETECTED Final   Salmonella species NOT DETECTED NOT DETECTED Final   Serratia marcescens NOT DETECTED NOT DETECTED Final   Haemophilus influenzae NOT DETECTED NOT DETECTED Final   Neisseria meningitidis NOT DETECTED NOT DETECTED Final   Pseudomonas aeruginosa NOT DETECTED NOT DETECTED Final   Stenotrophomonas maltophilia NOT DETECTED NOT DETECTED Final   Candida albicans NOT DETECTED NOT DETECTED Final   Candida auris NOT DETECTED NOT DETECTED Final   Candida glabrata NOT DETECTED NOT DETECTED Final   Candida krusei NOT DETECTED NOT DETECTED Final   Candida parapsilosis NOT DETECTED NOT DETECTED Final   Candida tropicalis NOT DETECTED NOT DETECTED Final   Cryptococcus neoformans/gattii NOT DETECTED NOT DETECTED Final   Meth resistant mecA/C and MREJ DETECTED (A) NOT DETECTED Final    Comment: CRITICAL RESULT CALLED TO, READ BACK BY AND VERIFIED WITH: D. WOFFORD PHARMD, AT 0745 01/05/20 BY Rush Landmark Performed at [facility], [address], [place], [place] [address]   SARS Coronavirus 2 by RT PCR (hospital order, performed in [facility] hospital lab) Nasopharyngeal Nasopharyngeal Swab     Status: None   Collection Time: 01/04/20  2:45 PM   Specimen: Nasopharyngeal Swab  Result Value Ref Range Status   SARS Coronavirus 2 NEGATIVE NEGATIVE Final    Comment: (NOTE) SARS-CoV-2 target nucleic acids are NOT DETECTED.  The SARS-CoV-2 RNA is generally detectable in upper and lower respiratory specimens during the acute phase of infection. The lowest concentration of SARS-CoV-2 viral copies this assay can detect is 250 copies / mL. A negative result does not preclude SARS-CoV-2 infection and should not be used as the sole basis for treatment or other patient management decisions.  A negative result may occur with improper specimen collection / handling, submission of specimen other than  nasopharyngeal swab, presence of viral mutation(s) within the areas targeted by this assay, and inadequate number of viral copies (<250 copies / mL). A negative result must be combined with clinical observations, patient history, and epidemiological information.  Fact Sheet for Patients:   StrictlyIdeas.no  Fact Sheet  for Healthcare Providers: BankingDealers.co.za  This test is not yet approved or  cleared by the [place] and has been authorized for detection and/or diagnosis of SARS-CoV-2 by FDA under an Emergency Use Authorization (EUA).  This EUA will remain in effect (meaning this test can be used) for the duration of the COVID-19 declaration under Section 564(b)(1) of the Act, 21 U.S.C. section 360bbb-3(b)(1), unless the authorization is terminated or revoked sooner.  Performed at [facility], [place] [address], [place], [place] [address]   Urine culture     Status: None   Collection Time: 01/04/20  9:20 PM   Specimen: Urine, Random  Result Value Ref Range Status   Specimen Description   Final    URINE, RANDOM Performed at [facility] [address] [place], [place] [address]    Special Requests   Final    NONE Performed at [facility], [place] [address], [place], [place] [address]    Culture   Final    NO GROWTH Performed at [facility], [place] [address], [place], [place] [address]    Report Status 01/05/2020 FINAL  Final  Aerobic/Anaerobic Culture (surgical/deep wound)     Status: None (Preliminary result)   Collection Time: 01/06/20  2:52 PM   Specimen: Soft Tissue, Other  Result Value Ref Range Status   Specimen Description WOUND  Final   Special Requests POSTOP CERVICAL LAMINECTOMY AND FUSION SITE  Final   Gram Stain   Final    NO WBC SEEN ABUNDANT GRAM POSITIVE COCCI Performed at [facility], [place] [address] [place], [place] [address]      Culture PENDING  Incomplete   Report Status PENDING  Incomplete     Terri Piedra, NP [facility]  01/07/2020  11:45 AM

## 2020-01-07 NOTE — Progress Notes (Signed)
  NEUROSURGERY PROGRESS NOTE   No issues overnight.  Complains of appropriate neck soreness No new N/T/W in extremities  EXAM:  BP (!) 162/70 (BP Location: Right Arm)   Pulse 89   Temp 98.1 F (36.7 C) (Oral)   Resp 15   Ht 5' 11.5" (1.816 m)   Wt 90 kg   SpO2 99%   BMI 27.29 kg/m   Awake, alert, oriented  Speech fluent, appropriate  CN grossly intact  MAEW, seemingly nonfocal focal exam Incision: blood on bandage, no active drainage  IMPRESSION/PLAN 69 y.o. male POD1 C4-T2 decompression and fusion for SEA, doing well. Stable neurologically. - PT/OT - Aspen collar when upright and OOB - Change dressing & reinforce as needed - hold chemical DVT ppx until POD7

## 2020-01-08 DIAGNOSIS — R7881 Bacteremia: Secondary | ICD-10-CM | POA: Diagnosis not present

## 2020-01-08 DIAGNOSIS — I10 Essential (primary) hypertension: Secondary | ICD-10-CM | POA: Diagnosis not present

## 2020-01-08 DIAGNOSIS — G062 Extradural and subdural abscess, unspecified: Secondary | ICD-10-CM | POA: Diagnosis not present

## 2020-01-08 DIAGNOSIS — N186 End stage renal disease: Secondary | ICD-10-CM | POA: Diagnosis not present

## 2020-01-08 LAB — BASIC METABOLIC PANEL
Anion gap: 19 — ABNORMAL HIGH (ref 5–15)
BUN: 75 mg/dL — ABNORMAL HIGH (ref 8–23)
CO2: 22 mmol/L (ref 22–32)
Calcium: 8.2 mg/dL — ABNORMAL LOW (ref 8.9–10.3)
Chloride: 94 mmol/L — ABNORMAL LOW (ref 98–111)
Creatinine, Ser: 9.11 mg/dL — ABNORMAL HIGH (ref 0.61–1.24)
GFR calc Af Amer: 6 mL/min — ABNORMAL LOW (ref >60)
GFR calc non Af Amer: 5 mL/min — ABNORMAL LOW (ref >60)
Glucose, Bld: 245 mg/dL — ABNORMAL HIGH (ref 70–99)
Potassium: 4 mmol/L (ref 3.5–5.1)
Sodium: 135 mmol/L (ref 135–145)

## 2020-01-08 LAB — GLUCOSE, CAPILLARY
Glucose-Capillary: 131 mg/dL — ABNORMAL HIGH (ref 70–99)
Glucose-Capillary: 148 mg/dL — ABNORMAL HIGH (ref 70–99)
Glucose-Capillary: 167 mg/dL — ABNORMAL HIGH (ref 70–99)
Glucose-Capillary: 189 mg/dL — ABNORMAL HIGH (ref 70–99)
Glucose-Capillary: 201 mg/dL — ABNORMAL HIGH (ref 70–99)
Glucose-Capillary: 235 mg/dL — ABNORMAL HIGH (ref 70–99)

## 2020-01-08 LAB — CBC
HCT: 27.4 % — ABNORMAL LOW (ref 39.0–52.0)
Hemoglobin: 9.6 g/dL — ABNORMAL LOW (ref 13.0–17.0)
MCH: 26.1 pg (ref 26.0–34.0)
MCHC: 35 g/dL (ref 30.0–36.0)
MCV: 74.5 fL — ABNORMAL LOW (ref 80.0–100.0)
Platelets: 232 10*3/uL (ref 150–400)
RBC: 3.68 MIL/uL — ABNORMAL LOW (ref 4.22–5.81)
RDW: 16.6 % — ABNORMAL HIGH (ref 11.5–15.5)
WBC: 15.4 10*3/uL — ABNORMAL HIGH (ref 4.0–10.5)
nRBC: 0.1 % (ref 0.0–0.2)

## 2020-01-08 LAB — HEPATITIS B SURFACE ANTIBODY, QUANTITATIVE: Hep B S AB Quant (Post): <3.1 m[IU]/mL — ABNORMAL LOW (ref >9.9)

## 2020-01-08 LAB — MAGNESIUM: Magnesium: 2.1 mg/dL (ref 1.7–2.4)

## 2020-01-08 MED ORDER — VANCOMYCIN HCL IN DEXTROSE 750-5 MG/150ML-% IV SOLN
750.0000 mg | INTRAVENOUS | Status: DC
  Administered 2020-01-10: 750 mg via INTRAVENOUS
  Filled 2020-01-08 (×7): qty 150

## 2020-01-08 MED ORDER — HYDROMORPHONE HCL 1 MG/ML IJ SOLN
0.5000 mg | Freq: Once | INTRAMUSCULAR | Status: AC | PRN
  Administered 2020-01-08: 0.5 mg via INTRAVENOUS
  Filled 2020-01-08: qty 1

## 2020-01-08 NOTE — Progress Notes (Signed)
PROGRESS NOTE    [name].  NFA:[id number] DOB: 07-Jul-1950 DOA: 01/04/2020 PCP: [facility]   Brief Narrative: [name]. is a 69 y.o. malewithhistory of ESRD on hemodialysis on Monday Wednesday Friday, hypertension, diabetes mellitus type 2, anemia history of colon cancer status post resection. Patient presented secondary to worsening headache and back pain and found to have an epidural abscess with associated cord compression in addition to MRSA positive bacteremia. Vancomycin started and neurosurgery consulted for management.   Assessment & Plan:   Principal Problem:   MRSA bacteremia Active Problems:   TOBACCO ABUSE   OBSTRUCTIVE SLEEP APNEA   Colon cancer, status Post resection    HYPERTENSION   ESRD (end stage renal disease) ([place])   Epidural abscess   Acute encephalopathy   Sepsis ([place])   Bacteremia associated with intravascular line ([place])   Sepsis Present on admission and secondary to bacteremia and epidural abscess. Associated leukocytosis which is currently stable. Afebrile. Patient started empirically on Vancomycin IV.  Cervical cord compression Epidural abscess Upper/lower extremity weakness Neurosurgery consulted on admission and patient is s/p  C4-T2 laminectomy and fusion for decompression on 8/5. MRSA in wound culture -Neurosurgery recommendations: collar while upright, PT, OT -Antibiotics below  MRSA bacteremia Noted on admission blood cultures. Patient started on Vancomycin IV empirically. Infectious disease consulted. Transthoracic Echocardiogram without mention of vegetation. -Infectious disease recommendations: Vancomycin/Ancef, Transthoracic Echocardiogram pending  ESRD on HD -Nephrology management  Anemia of chronic disease In setting of kidney disease. Stable.  Essential hypertension On amlodipine and metoprolol as an outpatient -Continue amlodipine and metoprolol  Diabetes mellitus, type 2 Patient is on Levemir  24 units qhs as an outpatient. Placed on SSI q4 hours while NPO -Continue SSI -Levemir at dose of 20 units qhs  History of colon cancer S/p resection.  Hyperlipidemia -Continue Lipitor  Pulsating abdominal mass In setting of smoker.  -Abdominal ultrasound   DVT prophylaxis: SCDs Code Status:   Code Status: Full Code Family Communication: None at bedside Disposition Plan: Discharge pending continued workup and management of epidural abscess and bacteremia   Consultants:   Neurosurgery  Nephrology  Infectious disease  Procedures:   HEMODIALYSIS  CERVICAL FOUR- THORACIC TWO LAMINECTOMY AND FUSION (01/06/2020)  TRANSTHORACIC ECHOCARDIOGRAM (01/07/2020)  Antimicrobials:  Vancomycin  Ancef   Subjective: No issues today.  Objective: Vitals:   01/07/20 1333 01/07/20 2100 01/08/20 0418 01/08/20 0945  BP: (!) 103/45 (!) 155/70 (!) 166/73 (!) 152/75  Pulse: (!) 56 85 91 86  Resp: 16 16 18 18   Temp: 97.7 F (36.5 C) 98.7 F (37.1 C) 98 F (36.7 C) 98.5 F (36.9 C)  TempSrc: Oral Oral Oral Oral  SpO2: 96% 96% 99% 98%  Weight:  97.4 kg    Height:        Intake/Output Summary (Last 24 hours) at 01/08/2020 1423 Last data filed at 01/08/2020 4696 Gross per 24 hour  Intake 960 ml  Output 300 ml  Net 660 ml   Filed Weights   01/07/20 0846 01/07/20 1251 01/07/20 2100  Weight: 95.9 kg 93.9 kg 97.4 kg    Examination:  General exam: Appears calm and comfortable Respiratory system: Clear to auscultation. Respiratory effort normal. Cardiovascular system: S1 & S2 heard, RRR. No murmurs, rubs, gallops or clicks. Gastrointestinal system: Abdomen is nondistended, soft and nontender. Pulsating mass. Normal bowel sounds heard. Central nervous system: Alert and oriented. No focal neurological deficits. Musculoskeletal: No edema. No calf tenderness Skin: No cyanosis. No rashes Psychiatry: Judgement  and insight appear normal. Mood & affect appropriate.     Data  Reviewed: I have personally reviewed following labs and imaging studies  CBC Lab Results  Component Value Date   WBC 15.4 (H) 01/08/2020   RBC 3.68 (L) 01/08/2020   HGB 9.6 (L) 01/08/2020   HCT 27.4 (L) 01/08/2020   MCV 74.5 (L) 01/08/2020   MCH 26.1 01/08/2020   PLT 232 01/08/2020   MCHC 35.0 01/08/2020   RDW 16.6 (H) 01/08/2020   LYMPHSABS 1.0 01/04/2020   MONOABS 1.5 (H) 01/04/2020   EOSABS 0.0 01/04/2020   BASOSABS 0.0 45/85/9292     Last metabolic panel Lab Results  Component Value Date   NA 135 01/08/2020   K 4.0 01/08/2020   CL 94 (L) 01/08/2020   CO2 22 01/08/2020   BUN 75 (H) 01/08/2020   CREATININE 9.11 (H) 01/08/2020   GLUCOSE 245 (H) 01/08/2020   GFRNONAA 5 (L) 01/08/2020   GFRAA 6 (L) 01/08/2020   CALCIUM 8.2 (L) 01/08/2020   PHOS 4.1 04/22/2012   PROT 9.1 (H) 01/04/2020   ALBUMIN 3.2 (L) 01/04/2020   BILITOT 1.1 01/04/2020   ALKPHOS 84 01/04/2020   AST 21 01/04/2020   ALT 20 01/04/2020   ANIONGAP 19 (H) 01/08/2020    CBG (last 3)  Recent Labs    01/08/20 0400 01/08/20 0817 01/08/20 1124  GLUCAP 189* 201* 167*     GFR: Estimated Creatinine Clearance: 9.3 mL/min (A) (by C-G formula based on SCr of 9.11 mg/dL (H)).  Coagulation Profile: Recent Labs  Lab 01/04/20 1213  INR 1.3*    Recent Results (from the past 240 hour(s))  Culture, blood (routine x 2)     Status: Abnormal   Collection Time: 01/04/20 12:13 PM   Specimen: BLOOD RIGHT HAND  Result Value Ref Range Status   Specimen Description   Final    BLOOD RIGHT HAND Performed at [facility] [address] [place], [place] [address]    Special Requests   Final    BOTTLES DRAWN AEROBIC AND ANAEROBIC Blood Culture adequate volume Performed at [facility] [address] [place], [place] [address]    Culture  Setup Time   Final    GRAM POSITIVE COCCI IN BOTH AEROBIC AND ANAEROBIC BOTTLES CRITICAL VALUE NOTED.  VALUE IS CONSISTENT WITH  PREVIOUSLY REPORTED AND CALLED VALUE.    Culture (A)  Final    STAPHYLOCOCCUS AUREUS SUSCEPTIBILITIES PERFORMED ON PREVIOUS CULTURE WITHIN THE LAST 5 DAYS. Performed at [facility], [place] [address], [place], [place] [address]    Report Status 01/07/2020 FINAL  Final  Culture, blood (routine x 2)     Status: Abnormal   Collection Time: 01/04/20 12:13 PM   Specimen: Site Not Specified; Blood  Result Value Ref Range Status   Specimen Description   Final    SITE NOT SPECIFIED Performed at [facility] [address], [place], [place] [address]    Special Requests   Final    BOTTLES DRAWN AEROBIC AND ANAEROBIC Blood Culture adequate volume Performed at [facility] [address], [place], [place] [address]    Culture  Setup Time   Final    GRAM POSITIVE COCCI IN BOTH AEROBIC AND ANAEROBIC BOTTLES Organism ID to follow CRITICAL RESULT CALLED TO, READ BACK BY AND VERIFIED WITH: D. WOFFORD PHARMD, AT 0745 01/05/20 BY Rush Landmark Performed at [facility], [place],  [place] [address]    Culture METHICILLIN RESISTANT STAPHYLOCOCCUS AUREUS (A)  Final   Report Status 01/07/2020 FINAL  Final   Organism ID, Bacteria METHICILLIN RESISTANT STAPHYLOCOCCUS AUREUS  Final      Susceptibility   Methicillin resistant staphylococcus aureus - MIC*    CIPROFLOXACIN >=8 RESISTANT Resistant     ERYTHROMYCIN >=8 RESISTANT Resistant     GENTAMICIN <=0.5 SENSITIVE Sensitive     OXACILLIN >=4 RESISTANT Resistant     TETRACYCLINE <=1 SENSITIVE Sensitive     VANCOMYCIN <=0.5 SENSITIVE Sensitive     TRIMETH/SULFA <=10 SENSITIVE Sensitive     CLINDAMYCIN <=0.25 SENSITIVE Sensitive     RIFAMPIN <=0.5 SENSITIVE Sensitive     Inducible Clindamycin NEGATIVE Sensitive     * METHICILLIN RESISTANT STAPHYLOCOCCUS AUREUS  Blood Culture ID Panel (Reflexed)     Status: Abnormal   Collection Time: 01/04/20 12:13 PM  Result Value Ref Range Status    Enterococcus faecalis NOT DETECTED NOT DETECTED Final   Enterococcus Faecium NOT DETECTED NOT DETECTED Final   Listeria monocytogenes NOT DETECTED NOT DETECTED Final   Staphylococcus species DETECTED (A) NOT DETECTED Final    Comment: CRITICAL RESULT CALLED TO, READ BACK BY AND VERIFIED WITH: D. WOFFORD PHARMD, AT 0745 01/05/20 BY D. VANHOOK    Staphylococcus aureus (BCID) DETECTED (A) NOT DETECTED Final    Comment: Methicillin (oxacillin)-resistant Staphylococcus aureus (MRSA). MRSA is predictably resistant to beta-lactam antibiotics (except ceftaroline). Preferred therapy is vancomycin unless clinically contraindicated. Patient requires contact precautions if  hospitalized. CRITICAL RESULT CALLED TO, READ BACK BY AND VERIFIED WITH: D. WOFFORD PHARMD, AT 0745 01/05/20 BY D. VANHOOK    Staphylococcus epidermidis NOT DETECTED NOT DETECTED Final   Staphylococcus lugdunensis NOT DETECTED NOT DETECTED Final   Streptococcus species NOT DETECTED NOT DETECTED Final   Streptococcus agalactiae NOT DETECTED NOT DETECTED Final   Streptococcus pneumoniae NOT DETECTED NOT DETECTED Final   Streptococcus pyogenes NOT DETECTED NOT DETECTED Final   A.calcoaceticus-baumannii NOT DETECTED NOT DETECTED Final   Bacteroides fragilis NOT DETECTED NOT DETECTED Final   Enterobacterales NOT DETECTED NOT DETECTED Final   Enterobacter cloacae complex NOT DETECTED NOT DETECTED Final   Escherichia coli NOT DETECTED NOT DETECTED Final   Klebsiella aerogenes NOT DETECTED NOT DETECTED Final   Klebsiella oxytoca NOT DETECTED NOT DETECTED Final   Klebsiella pneumoniae NOT DETECTED NOT DETECTED Final   Proteus species NOT DETECTED NOT DETECTED Final   Salmonella species NOT DETECTED NOT DETECTED Final   Serratia marcescens NOT DETECTED NOT DETECTED Final   Haemophilus influenzae NOT DETECTED NOT DETECTED Final   Neisseria meningitidis NOT DETECTED NOT DETECTED Final   Pseudomonas aeruginosa NOT DETECTED NOT DETECTED Final     Stenotrophomonas maltophilia NOT DETECTED NOT DETECTED Final   Candida albicans NOT DETECTED NOT DETECTED Final   Candida auris NOT DETECTED NOT DETECTED Final   Candida glabrata NOT DETECTED NOT DETECTED Final   Candida krusei NOT DETECTED NOT DETECTED Final   Candida parapsilosis NOT DETECTED NOT DETECTED Final   Candida tropicalis NOT DETECTED NOT DETECTED Final   Cryptococcus neoformans/gattii NOT DETECTED NOT DETECTED Final   Meth resistant mecA/C and MREJ DETECTED (A) NOT DETECTED Final    Comment: CRITICAL RESULT CALLED TO, READ BACK BY AND VERIFIED WITH: D. WOFFORD PHARMD, AT 0745 01/05/20 BY Rush Landmark Performed at [facility], [address] [place], [place] [address]   SARS Coronavirus 2 by RT PCR (hospital order, performed in [facility] hospital lab) Nasopharyngeal  Nasopharyngeal Swab     Status: None   Collection Time: 01/04/20  2:45 PM   Specimen: Nasopharyngeal Swab  Result Value Ref Range Status   SARS Coronavirus 2 NEGATIVE NEGATIVE Final    Comment: (NOTE) SARS-CoV-2 target nucleic acids are NOT DETECTED.  The SARS-CoV-2 RNA is generally detectable in upper and lower respiratory specimens during the acute phase of infection. The lowest concentration of SARS-CoV-2 viral copies this assay can detect is 250 copies / mL. A negative result does not preclude SARS-CoV-2 infection and should not be used as the sole basis for treatment or other patient management decisions.  A negative result may occur with improper specimen collection / handling, submission of specimen other than nasopharyngeal swab, presence of viral mutation(s) within the areas targeted by this assay, and inadequate number of viral copies (<250 copies / mL). A negative result must be combined with clinical observations, patient history, and epidemiological information.  Fact Sheet for Patients:   StrictlyIdeas.no  Fact Sheet for Healthcare  Providers: BankingDealers.co.za  This test is not yet approved or  cleared by the [place] FDA and has been authorized for detection and/or diagnosis of SARS-CoV-2 by FDA under an Emergency Use Authorization (EUA).  This EUA will remain in effect (meaning this test can be used) for the duration of the COVID-19 declaration under Section 564(b)(1) of the Act, 21 U.S.C. section 360bbb-3(b)(1), unless the authorization is terminated or revoked sooner.  Performed at [facility], [place] [address], [place], [place] [address]   Urine culture     Status: None   Collection Time: 01/04/20  9:20 PM   Specimen: Urine, Random  Result Value Ref Range Status   Specimen Description   Final    URINE, RANDOM Performed at [facility] [address], [place], [place] [address]    Special Requests   Final    NONE Performed at [facility], [place] [address], [place], [place] [address]    Culture   Final    NO GROWTH Performed at [facility], [place] [address], [place], [place] [address]    Report Status 01/05/2020 FINAL  Final  Aerobic/Anaerobic Culture (surgical/deep wound)     Status: None (Preliminary result)   Collection Time: 01/06/20  2:52 PM   Specimen: Soft Tissue, Other  Result Value Ref Range Status   Specimen Description WOUND  Final   Special Requests POSTOP CERVICAL LAMINECTOMY AND FUSION SITE  Final   Gram Stain   Final    NO WBC SEEN ABUNDANT GRAM POSITIVE COCCI Performed at [facility], [place] [address], [place], [place] [address]    Culture   Final    MODERATE METHICILLIN RESISTANT STAPHYLOCOCCUS AUREUS NO ANAEROBES ISOLATED; CULTURE IN PROGRESS FOR 5 DAYS    Report Status PENDING  Incomplete   Organism ID, Bacteria METHICILLIN RESISTANT STAPHYLOCOCCUS AUREUS  Final      Susceptibility   Methicillin resistant staphylococcus aureus - MIC*    CIPROFLOXACIN >=8 RESISTANT Resistant      ERYTHROMYCIN >=8 RESISTANT Resistant     GENTAMICIN <=0.5 SENSITIVE Sensitive     OXACILLIN >=4 RESISTANT Resistant     TETRACYCLINE <=1 SENSITIVE Sensitive     VANCOMYCIN 1 SENSITIVE Sensitive     TRIMETH/SULFA <=10 SENSITIVE Sensitive     CLINDAMYCIN <=0.25 SENSITIVE Sensitive     RIFAMPIN <=0.5 SENSITIVE Sensitive     Inducible Clindamycin NEGATIVE Sensitive     * MODERATE METHICILLIN  RESISTANT STAPHYLOCOCCUS AUREUS  Culture, blood (routine x 2)     Status: None (Preliminary result)   Collection Time: 01/07/20  7:10 AM   Specimen: BLOOD  Result Value Ref Range Status   Specimen Description BLOOD RIGHT ANTECUBITAL  Final   Special Requests   Final    BOTTLES DRAWN AEROBIC ONLY Blood Culture results may not be optimal due to an inadequate volume of blood received in culture bottles   Culture   Final    NO GROWTH 1 DAY Performed at [facility], [place] [address] [place], [place] [address]    Report Status PENDING  Incomplete  Culture, blood (routine x 2)     Status: None (Preliminary result)   Collection Time: 01/07/20  7:21 AM   Specimen: BLOOD RIGHT HAND  Result Value Ref Range Status   Specimen Description BLOOD RIGHT HAND  Final   Special Requests   Final    BOTTLES DRAWN AEROBIC AND ANAEROBIC Blood Culture results may not be optimal due to an excessive volume of blood received in culture bottles   Culture  Setup Time PENDING  Incomplete   Culture   Final    NO GROWTH 1 DAY Performed at [facility], [place] [address] [place], [place] [address]    Report Status PENDING  Incomplete  Culture, blood (Routine X 2) w Reflex to ID Panel     Status: None (Preliminary result)   Collection Time: 01/07/20  7:15 PM   Specimen: BLOOD  Result Value Ref Range Status   Specimen Description BLOOD BLOOD RIGHT FOREARM  Final   Special Requests   Final    BOTTLES DRAWN AEROBIC AND ANAEROBIC Blood Culture adequate volume   Culture   Final    NO GROWTH < 12 HOURS Performed at  [facility], [place] [address], [place], [place] [address]    Report Status PENDING  Incomplete  Culture, blood (Routine X 2) w Reflex to ID Panel     Status: None (Preliminary result)   Collection Time: 01/07/20  7:26 PM   Specimen: BLOOD RIGHT HAND  Result Value Ref Range Status   Specimen Description BLOOD RIGHT HAND  Final   Special Requests   Final    BOTTLES DRAWN AEROBIC AND ANAEROBIC Blood Culture adequate volume   Culture   Final    NO GROWTH < 12 HOURS Performed at [facility], [place] [address] [place],  [address]    Report Status PENDING  Incomplete        Radiology Studies: DG Cervical Spine 2-3 Views  Result Date: 01/06/2020 CLINICAL DATA:  Cervicothoracic laminectomy infusion EXAM: CERVICAL SPINE - 2-3 VIEW; DG C-ARM 1-60 MIN COMPARISON:  None. FLUOROSCOPY TIME:  Fluoroscopy Time:  10.8 seconds Radiation Exposure Index (if provided by the fluoroscopic device): 2.51 mGy Number of Acquired Spot Images: 2 FINDINGS: Posterior fusion at C4-T1 is noted. The inferior aspect is incompletely evaluated on the lateral film. IMPRESSION: Cervicothoracic fusion. Electronically Signed   By: Inez Catalina M.D.   On: 01/06/2020 16:46   DG C-Arm 1-60 Min  Result Date: 01/06/2020 CLINICAL DATA:  Cervicothoracic laminectomy infusion EXAM: CERVICAL SPINE - 2-3 VIEW; DG C-ARM 1-60 MIN COMPARISON:  None. FLUOROSCOPY TIME:  Fluoroscopy Time:  10.8 seconds Radiation Exposure Index (if provided by the fluoroscopic device): 2.51 mGy Number of Acquired Spot Images: 2 FINDINGS: Posterior fusion at C4-T1 is noted. The inferior aspect is incompletely evaluated on the lateral film.  IMPRESSION: Cervicothoracic fusion. Electronically Signed   By: Inez Catalina M.D.   On: 01/06/2020 16:46   ECHOCARDIOGRAM COMPLETE  Result Date: 01/07/2020    ECHOCARDIOGRAM REPORT   Patient Name:   [name]. Date of Exam: 01/07/2020 Medical Rec #:  [id number]          Height:       71.5 in Accession #:     [id number]         Weight:       211.4 lb Date of Birth:  [birth year]         BSA:          2.170 m Patient Age:    28 years           BP:           162/70 mmHg Patient Gender: M                  HR:           67 bpm. Exam Location:  Inpatient Procedure: 2D Echo, Cardiac Doppler and Color Doppler Indications:    Bacteremia.  History:        Patient has no prior history of Echocardiogram examinations.                 Signs/Symptoms:Murmur; Risk Factors:Current Smoker and Diabetes.                 MRSA. Cancer. ESRD.  Sonographer:    Roseanna Rainbow RDCS Referring Phys: [id number] [facility] AMIN  Sonographer Comments: Technically difficult study due to poor echo windows. Patient appears confused, had trouble stayiong still during exam. Cervical brace prevented optimal positioning and windows. IMPRESSIONS  1. Left ventricular ejection fraction, by estimation, is >75%. The left ventricle has hyperdynamic function. The left ventricle has no regional wall motion abnormalities. There is mild left ventricular hypertrophy. Left ventricular diastolic parameters are consistent with Grade II diastolic dysfunction (pseudonormalization). Elevated left atrial pressure.  2. Right ventricular systolic function is normal. The right ventricular size is normal.  3. The mitral valve is normal in structure. Trivial mitral valve regurgitation. No evidence of mitral stenosis.  4. The aortic valve is tricuspid. Aortic valve regurgitation is not visualized. Mild aortic valve sclerosis is present, with no evidence of aortic valve stenosis.  5. The inferior vena cava is normal in size with greater than 50% respiratory variability, suggesting right atrial pressure of 3 mmHg. FINDINGS  Left Ventricle: Left ventricular ejection fraction, by estimation, is >75%. The left ventricle has hyperdynamic function. The left ventricle has no regional wall motion abnormalities. The left ventricular internal cavity size was normal in size. There is mild left  ventricular hypertrophy. Left ventricular diastolic parameters are consistent with Grade II diastolic dysfunction (pseudonormalization). Elevated left atrial pressure. Right Ventricle: The right ventricular size is normal.Right ventricular systolic function is normal. Left Atrium: Left atrial size was normal in size. Right Atrium: Right atrial size was normal in size. Pericardium: There is no evidence of pericardial effusion. Mitral Valve: The mitral valve is normal in structure. Normal mobility of the mitral valve leaflets. Trivial mitral valve regurgitation. No evidence of mitral valve stenosis. Tricuspid Valve: The tricuspid valve is normal in structure. Tricuspid valve regurgitation is trivial. No evidence of tricuspid stenosis. Aortic Valve: The aortic valve is tricuspid. Aortic valve regurgitation is not visualized. Mild aortic valve sclerosis is present, with no evidence of aortic valve stenosis. Pulmonic Valve: The pulmonic valve was not  well visualized. Pulmonic valve regurgitation is not visualized. No evidence of pulmonic stenosis. Aorta: The aortic root is normal in size and structure. Venous: The inferior vena cava is normal in size with greater than 50% respiratory variability, suggesting right atrial pressure of 3 mmHg. IAS/Shunts: No atrial level shunt detected by color flow Doppler.  LEFT VENTRICLE PLAX 2D LVIDd:         5.10 cm     Diastology LVIDs:         3.10 cm     LV e' lateral:   10.10 cm/s LV PW:         1.40 cm     LV E/e' lateral: 11.3 LV IVS:        1.00 cm     LV e' medial:    6.64 cm/s LVOT diam:     2.10 cm     LV E/e' medial:  17.2 LV SV:         105 LV SV Index:   48 LVOT Area:     3.46 cm  LV Volumes (MOD) LV vol d, MOD A2C: 71.2 ml LV vol d, MOD A4C: 90.8 ml LV vol s, MOD A2C: 23.8 ml LV vol s, MOD A4C: 22.6 ml LV SV MOD A2C:     47.4 ml LV SV MOD A4C:     90.8 ml LV SV MOD BP:      61.5 ml RIGHT VENTRICLE             IVC RV S prime:     17.00 cm/s  IVC diam: 1.50 cm TAPSE  (M-mode): 3.1 cm LEFT ATRIUM           Index       RIGHT ATRIUM           Index LA diam:      3.00 cm 1.38 cm/m  RA Area:     12.30 cm LA Vol (A2C): 21.2 ml 9.77 ml/m  RA Volume:   23.40 ml  10.78 ml/m LA Vol (A4C): 47.8 ml 22.03 ml/m  AORTIC VALVE LVOT Vmax:   151.00 cm/s LVOT Vmean:  88.600 cm/s LVOT VTI:    0.302 m  AORTA Ao Root diam: 3.30 cm MITRAL VALVE MV Area (PHT): 3.06 cm     SHUNTS MV Decel Time: 248 msec     Systemic VTI:  0.30 m MV E velocity: 114.00 cm/s  Systemic Diam: 2.10 cm MV A velocity: 111.00 cm/s MV E/A ratio:  1.03 Kirk Ruths MD Electronically signed by Kirk Ruths MD Signature Date/Time: 01/07/2020/3:10:33 PM    Final         Scheduled Meds: . amLODipine  10 mg Oral QHS  . atorvastatin  20 mg Oral QHS  . Chlorhexidine Gluconate Cloth  6 each Topical Q0600  . Chlorhexidine Gluconate Cloth  6 each Topical Q0600  . insulin aspart  0-6 Units Subcutaneous Q4H  . insulin detemir  20 Units Subcutaneous QHS  . metoprolol tartrate  50 mg Oral BID   Continuous Infusions: . sodium chloride 10 mL/hr at 01/06/20 1304  . [START ON 01/10/2020] vancomycin       LOS: 4 days     Cordelia Poche, MD [facility] 01/08/2020, 2:23 PM  If 7PM-7AM, please contact night-coverage [email or web address]

## 2020-01-08 NOTE — Progress Notes (Signed)
Neurosurgery  S: mild neck soreness  BP (!) 152/75 (BP Location: Right Arm)   Pulse 86   Temp 98.5 F (36.9 C) (Oral)   Resp 18   Ht 5' 11.5" (1.816 m)   Wt 97.4 kg   SpO2 98%   BMI 29.53 kg/m  Awake, alert NAD 4/5 strength in hand grip, 5/5 in HF, KE, DF, and PF Dressing with dried blood  POD#2 C4-T2 PCDF for spinal infection - continue C-collar when upright - pharmacologic DVT ppx to start POD#7

## 2020-01-08 NOTE — Progress Notes (Signed)
[facility] Progress Note   Subjective: Awake, oriented to person, place, knows it's Saturday. Seems anxious, can't lie still. No Specific complaints.    Objective Vitals:   01/07/20 1333 01/07/20 2100 01/08/20 0418 01/08/20 0945  BP: (!) 103/45 (!) 155/70 (!) 166/73 (!) 152/75  Pulse: (!) 56 85 91 86  Resp: 16 16 18 18   Temp: 97.7 F (36.5 C) 98.7 F (37.1 C) 98 F (36.7 C) 98.5 F (36.9 C)  TempSrc: Oral Oral Oral Oral  SpO2: 96% 96% 99% 98%  Weight:  97.4 kg    Height:       Physical Exam General: Pleasant older male in NAD Heart: S1, S2 RRR Lungs: CTAB Abdomen: S, NT Extremities: No LE edema Dialysis Access: L AVF + bruit   Additional Objective Labs: Basic Metabolic Panel: Recent Labs  Lab 01/06/20 0258 01/06/20 1542 01/06/20 1651 01/07/20 0720 01/08/20 0210  NA 137   < > 136 130* 135  K 4.2   < > 6.0* 5.3* 4.0  CL 96*  --   --  93* 94*  CO2 20*  --   --  16* 22  GLUCOSE 144*  --   --  353* 245*  BUN 86*  --   --  130* 75*  CREATININE 12.57*  --   --  14.21* 9.11*  CALCIUM 8.3*  --   --  8.4* 8.2*   < > = values in this interval not displayed.   Liver Function Tests: Recent Labs  Lab 01/04/20 1213  AST 21  ALT 20  ALKPHOS 84  BILITOT 1.1  PROT 9.1*  ALBUMIN 3.2*   No results for input(s): LIPASE, AMYLASE in the last 168 hours. CBC: Recent Labs  Lab 01/04/20 1213 01/04/20 1213 01/05/20 0502 01/05/20 0502 01/06/20 0258 01/06/20 1542 01/06/20 1651 01/07/20 0720 01/08/20 0210  WBC 25.3*   < > 24.1*   < > 25.8*  --   --  16.8* 15.4*  NEUTROABS 22.5*  --   --   --   --   --   --   --   --   HGB 10.0*   < > 8.8*   < > 8.7*   < > 9.2* 8.7* 9.6*  HCT 28.6*   < > 25.3*   < > 24.9*   < > 27.0* 25.0* 27.4*  MCV 75.5*  --  74.4*  --  73.5*  --   --  74.4* 74.5*  PLT 333   < > 266   < > 290  --   --  223 232   < > = values in this interval not displayed.   Blood Culture    Component Value Date/Time   SDES BLOOD RIGHT HAND  01/07/2020 1926   SPECREQUEST  01/07/2020 1926    BOTTLES DRAWN AEROBIC AND ANAEROBIC Blood Culture adequate volume   CULT  01/07/2020 1926    NO GROWTH < 12 HOURS Performed at [facility], [place] [address], [place], [place] [address]    REPTSTATUS PENDING 01/07/2020 1926    Cardiac Enzymes: No results for input(s): CKTOTAL, CKMB, CKMBINDEX, TROPONINI in the last 168 hours. CBG: Recent Labs  Lab 01/07/20 2059 01/08/20 0030 01/08/20 0400 01/08/20 0817 01/08/20 1124  GLUCAP 216* 235* 189* 201* 167*   Iron Studies: No results for input(s): IRON, TIBC, TRANSFERRIN, FERRITIN in the last 72 hours. @lablastinr3 @ Studies/Results: DG Cervical Spine 2-3 Views  Result Date: 01/06/2020 CLINICAL DATA:  Cervicothoracic  laminectomy infusion EXAM: CERVICAL SPINE - 2-3 VIEW; DG C-ARM 1-60 MIN COMPARISON:  None. FLUOROSCOPY TIME:  Fluoroscopy Time:  10.8 seconds Radiation Exposure Index (if provided by the fluoroscopic device): 2.51 mGy Number of Acquired Spot Images: 2 FINDINGS: Posterior fusion at C4-T1 is noted. The inferior aspect is incompletely evaluated on the lateral film. IMPRESSION: Cervicothoracic fusion. Electronically Signed   By: Inez Catalina M.D.   On: 01/06/2020 16:46   DG C-Arm 1-60 Min  Result Date: 01/06/2020 CLINICAL DATA:  Cervicothoracic laminectomy infusion EXAM: CERVICAL SPINE - 2-3 VIEW; DG C-ARM 1-60 MIN COMPARISON:  None. FLUOROSCOPY TIME:  Fluoroscopy Time:  10.8 seconds Radiation Exposure Index (if provided by the fluoroscopic device): 2.51 mGy Number of Acquired Spot Images: 2 FINDINGS: Posterior fusion at C4-T1 is noted. The inferior aspect is incompletely evaluated on the lateral film. IMPRESSION: Cervicothoracic fusion. Electronically Signed   By: Inez Catalina M.D.   On: 01/06/2020 16:46   ECHOCARDIOGRAM COMPLETE  Result Date: 01/07/2020    ECHOCARDIOGRAM REPORT   Patient Name:   [name]. Date of Exam: 01/07/2020 Medical Rec #:  [id number]          Height:        71.5 in Accession #:    [id number]         Weight:       211.4 lb Date of Birth:  [birth year]         BSA:          2.170 m Patient Age:    51 years           BP:           162/70 mmHg Patient Gender: M                  HR:           67 bpm. Exam Location:  Inpatient Procedure: 2D Echo, Cardiac Doppler and Color Doppler Indications:    Bacteremia.  History:        Patient has no prior history of Echocardiogram examinations.                 Signs/Symptoms:Murmur; Risk Factors:Current Smoker and Diabetes.                 MRSA. Cancer. ESRD.  Sonographer:    Roseanna Rainbow RDCS Referring Phys: [id number] [facility] AMIN  Sonographer Comments: Technically difficult study due to poor echo windows. Patient appears confused, had trouble stayiong still during exam. Cervical brace prevented optimal positioning and windows. IMPRESSIONS  1. Left ventricular ejection fraction, by estimation, is >75%. The left ventricle has hyperdynamic function. The left ventricle has no regional wall motion abnormalities. There is mild left ventricular hypertrophy. Left ventricular diastolic parameters are consistent with Grade II diastolic dysfunction (pseudonormalization). Elevated left atrial pressure.  2. Right ventricular systolic function is normal. The right ventricular size is normal.  3. The mitral valve is normal in structure. Trivial mitral valve regurgitation. No evidence of mitral stenosis.  4. The aortic valve is tricuspid. Aortic valve regurgitation is not visualized. Mild aortic valve sclerosis is present, with no evidence of aortic valve stenosis.  5. The inferior vena cava is normal in size with greater than 50% respiratory variability, suggesting right atrial pressure of 3 mmHg. FINDINGS  Left Ventricle: Left ventricular ejection fraction, by estimation, is >75%. The left ventricle has hyperdynamic function. The left ventricle has no regional wall motion abnormalities. The left ventricular internal cavity  size was normal in  size. There is mild left ventricular hypertrophy. Left ventricular diastolic parameters are consistent with Grade II diastolic dysfunction (pseudonormalization). Elevated left atrial pressure. Right Ventricle: The right ventricular size is normal.Right ventricular systolic function is normal. Left Atrium: Left atrial size was normal in size. Right Atrium: Right atrial size was normal in size. Pericardium: There is no evidence of pericardial effusion. Mitral Valve: The mitral valve is normal in structure. Normal mobility of the mitral valve leaflets. Trivial mitral valve regurgitation. No evidence of mitral valve stenosis. Tricuspid Valve: The tricuspid valve is normal in structure. Tricuspid valve regurgitation is trivial. No evidence of tricuspid stenosis. Aortic Valve: The aortic valve is tricuspid. Aortic valve regurgitation is not visualized. Mild aortic valve sclerosis is present, with no evidence of aortic valve stenosis. Pulmonic Valve: The pulmonic valve was not well visualized. Pulmonic valve regurgitation is not visualized. No evidence of pulmonic stenosis. Aorta: The aortic root is normal in size and structure. Venous: The inferior vena cava is normal in size with greater than 50% respiratory variability, suggesting right atrial pressure of 3 mmHg. IAS/Shunts: No atrial level shunt detected by color flow Doppler.  LEFT VENTRICLE PLAX 2D LVIDd:         5.10 cm     Diastology LVIDs:         3.10 cm     LV e' lateral:   10.10 cm/s LV PW:         1.40 cm     LV E/e' lateral: 11.3 LV IVS:        1.00 cm     LV e' medial:    6.64 cm/s LVOT diam:     2.10 cm     LV E/e' medial:  17.2 LV SV:         105 LV SV Index:   48 LVOT Area:     3.46 cm  LV Volumes (MOD) LV vol d, MOD A2C: 71.2 ml LV vol d, MOD A4C: 90.8 ml LV vol s, MOD A2C: 23.8 ml LV vol s, MOD A4C: 22.6 ml LV SV MOD A2C:     47.4 ml LV SV MOD A4C:     90.8 ml LV SV MOD BP:      61.5 ml RIGHT VENTRICLE             IVC RV S prime:     17.00 cm/s  IVC  diam: 1.50 cm TAPSE (M-mode): 3.1 cm LEFT ATRIUM           Index       RIGHT ATRIUM           Index LA diam:      3.00 cm 1.38 cm/m  RA Area:     12.30 cm LA Vol (A2C): 21.2 ml 9.77 ml/m  RA Volume:   23.40 ml  10.78 ml/m LA Vol (A4C): 47.8 ml 22.03 ml/m  AORTIC VALVE LVOT Vmax:   151.00 cm/s LVOT Vmean:  88.600 cm/s LVOT VTI:    0.302 m  AORTA Ao Root diam: 3.30 cm MITRAL VALVE MV Area (PHT): 3.06 cm     SHUNTS MV Decel Time: 248 msec     Systemic VTI:  0.30 m MV E velocity: 114.00 cm/s  Systemic Diam: 2.10 cm MV A velocity: 111.00 cm/s MV E/A ratio:  1.03 Kirk Ruths MD Electronically signed by Kirk Ruths MD Signature Date/Time: 01/07/2020/3:10:33 PM    Final    Medications: . sodium chloride 10  mL/hr at 01/06/20 1304   . amLODipine  10 mg Oral QHS  . atorvastatin  20 mg Oral QHS  . Chlorhexidine Gluconate Cloth  6 each Topical Q0600  . Chlorhexidine Gluconate Cloth  6 each Topical Q0600  . insulin aspart  0-6 Units Subcutaneous Q4H  . insulin detemir  20 Units Subcutaneous QHS  . metoprolol tartrate  50 mg Oral BID  . vancomycin variable dose per unstable renal function (pharmacist dosing)   Does not apply See admin instructions     Dialysis Orders: Center:[facility] MWF. MHW80 kgHD Bath 2K/2CaTime 4.25 hrsHeparin none. AccessRIJ TDC and LUE AVFBFR 400DFR 800  Calcitriol 1.58mcg po/HD Micera 150 mcg IV q 2 weeks last given on 12/29/19  Assessment/Plan: 1.Epidural abscess - Etiology unclear. IR unable to aspirate. MRI showed abscess extending from C4-T2 with cord compression. s/p C4-T2 laminectomy and fusion for decompression 01/06/20 with improved pain.  2.MRSA bacteremia - ID consulted - recommend Vanc, to have TTE. Repeat [facility] 08/06 NGTD. TTE done 08/06 without mention of vegetation on valves.Using LU AVF for dialysis. TDC removed 01/07/2020 per IR.  3. 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  MRI of lumbar spine with  severe spinal stenosis at L4-5 as well as some subdural fluid collection at %12-L3 with mass effect and could possibly be infected material.  Plan per Neurosurgery. 4. ESRD- on HD MWF. Next HD 01/10/2020 5. Anemiaof CKD-Hgb 9.6 today. ESA recently dosed. Follow trends, transfuse prn.  6. Secondary hyperparathyroidism- Ca at goal. No recent PO4 done. Draw renal function panel tomorrow.  Continue VDRA and binders when no longer NPO.  7.HTN/volume- BP elevated. Continue home meds when no longer Volume status stable. UF as tolerated.  8. Nutrition- Renal/Carb mod diet.    [place] Flett H. Tonni Mansour NP-C 01/08/2020, 11:33 AM  [employer] [phone]

## 2020-01-08 NOTE — Progress Notes (Signed)
Pharmacy Antibiotic Note  [name]. is a 69 y.o. male admitted on 01/04/2020 with MRSA bacteremia/epidural abscess of C spine. Pharmacy has been consulted for vancomycin dosing. Patient is ESRD with HD on MWF.   [name] went to surgery 8/5 for a laminectomy and fusion. HD is scheduled MWF. Will schedule vanc dosing for after HD.   Plan: - Vancomycin 750mg  IV MWF after HD - Draw vanc level as needed - Follow up LOT   Height: 5' 11.5" (181.6 cm) Weight: 97.4 kg (214 lb 11.7 oz) IBW/kg (Calculated) : 76.45  Temp (24hrs), Avg:98.4 F (36.9 C), Min:98 F (36.7 C), Max:98.7 F (37.1 C)  Recent Labs  Lab 01/04/20 1213 01/04/20 1400 01/05/20 0502 01/06/20 0258 01/07/20 0720 01/08/20 0210  WBC 25.3*  --  24.1* 25.8* 16.8* 15.4*  CREATININE 15.45*  --  16.74* 12.57* 14.21* 9.11*  LATICACIDVEN  --  1.7  --   --   --   --   VANCORANDOM  --   --   --   --  23  --     Estimated Creatinine Clearance: 9.3 mL/min (A) (by C-G formula based on SCr of 9.11 mg/dL (H)).    No Known Allergies  Antimicrobials this admission: vancomycin 8/4 >>   Dose adjustments this admission:  Microbiology results: 8/3 BCx: 4/4 MRSA 8/3 UCx: No growth  8/5 Wound: MRSA 8/6 Repeat BCx: No growth <12 h  Thank you for allowing pharmacy to be a part of this patient's care.  Rebbeca Paul, PharmD PGY1 Pharmacy Resident 01/08/2020 1:55 PM  Please check [email or web address] for unit-specific pharmacy phone numbers.

## 2020-01-09 ENCOUNTER — Inpatient Hospital Stay (HOSPITAL_COMMUNITY): Payer: No Typology Code available for payment source

## 2020-01-09 DIAGNOSIS — N186 End stage renal disease: Secondary | ICD-10-CM | POA: Diagnosis not present

## 2020-01-09 DIAGNOSIS — R7881 Bacteremia: Secondary | ICD-10-CM | POA: Diagnosis not present

## 2020-01-09 DIAGNOSIS — G062 Extradural and subdural abscess, unspecified: Secondary | ICD-10-CM | POA: Diagnosis not present

## 2020-01-09 DIAGNOSIS — I10 Essential (primary) hypertension: Secondary | ICD-10-CM | POA: Diagnosis not present

## 2020-01-09 LAB — RENAL FUNCTION PANEL
Albumin: 2 g/dL — ABNORMAL LOW (ref 3.5–5.0)
Anion gap: 21 — ABNORMAL HIGH (ref 5–15)
BUN: 102 mg/dL — ABNORMAL HIGH (ref 8–23)
CO2: 20 mmol/L — ABNORMAL LOW (ref 22–32)
Calcium: 8 mg/dL — ABNORMAL LOW (ref 8.9–10.3)
Chloride: 94 mmol/L — ABNORMAL LOW (ref 98–111)
Creatinine, Ser: 10.85 mg/dL — ABNORMAL HIGH (ref 0.61–1.24)
GFR calc Af Amer: 5 mL/min — ABNORMAL LOW (ref >60)
GFR calc non Af Amer: 4 mL/min — ABNORMAL LOW (ref >60)
Glucose, Bld: 179 mg/dL — ABNORMAL HIGH (ref 70–99)
Phosphorus: 8.6 mg/dL — ABNORMAL HIGH (ref 2.5–4.6)
Potassium: 5.6 mmol/L — ABNORMAL HIGH (ref 3.5–5.1)
Sodium: 135 mmol/L (ref 135–145)

## 2020-01-09 LAB — GLUCOSE, CAPILLARY
Glucose-Capillary: 166 mg/dL — ABNORMAL HIGH (ref 70–99)
Glucose-Capillary: 172 mg/dL — ABNORMAL HIGH (ref 70–99)
Glucose-Capillary: 180 mg/dL — ABNORMAL HIGH (ref 70–99)
Glucose-Capillary: 191 mg/dL — ABNORMAL HIGH (ref 70–99)
Glucose-Capillary: 243 mg/dL — ABNORMAL HIGH (ref 70–99)

## 2020-01-09 LAB — TYPE AND SCREEN
ABO/RH(D): AB POS
Antibody Screen: NEGATIVE
Unit division: 0
Unit division: 0
Unit division: 0
Unit division: 0

## 2020-01-09 LAB — CBC
HCT: 28.7 % — ABNORMAL LOW (ref 39.0–52.0)
Hemoglobin: 10 g/dL — ABNORMAL LOW (ref 13.0–17.0)
MCH: 26.5 pg (ref 26.0–34.0)
MCHC: 34.8 g/dL (ref 30.0–36.0)
MCV: 76.1 fL — ABNORMAL LOW (ref 80.0–100.0)
Platelets: 270 10*3/uL (ref 150–400)
RBC: 3.77 MIL/uL — ABNORMAL LOW (ref 4.22–5.81)
RDW: 17.2 % — ABNORMAL HIGH (ref 11.5–15.5)
WBC: 18.8 10*3/uL — ABNORMAL HIGH (ref 4.0–10.5)
nRBC: 0 % (ref 0.0–0.2)

## 2020-01-09 LAB — BPAM RBC
Blood Product Expiration Date: 202108302359
Blood Product Expiration Date: 202108302359
Blood Product Expiration Date: 202108302359
Blood Product Expiration Date: 202108302359
ISSUE DATE / TIME: 202108051551
ISSUE DATE / TIME: 202108051551
Unit Type and Rh: 6200
Unit Type and Rh: 6200
Unit Type and Rh: 6200
Unit Type and Rh: 6200

## 2020-01-09 LAB — MAGNESIUM: Magnesium: 2.3 mg/dL (ref 1.7–2.4)

## 2020-01-09 IMAGING — US US AORTA
1 series · 14 of 24 positions shown · non-contrast
Comparison: CT abdomen and pelvis [DATE]

CLINICAL DATA: Pulsatile abdominal mass. Evaluate for possible
abdominal aortic aneurysm.

EXAM:
ULTRASOUND OF ABDOMINAL AORTA
TECHNIQUE: Ultrasound examination of the abdominal aorta and proximal common
iliac arteries was performed to evaluate for aneurysm. Additional
color and Doppler images of the distal aorta were obtained to
document patency.

[Series 1: us aorta · 14 of 24 slices shown]
[im 1/24]
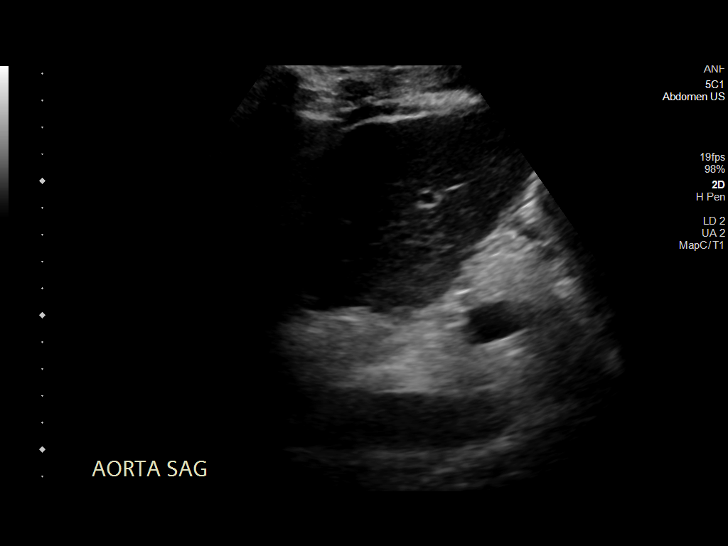
[im 3/24]
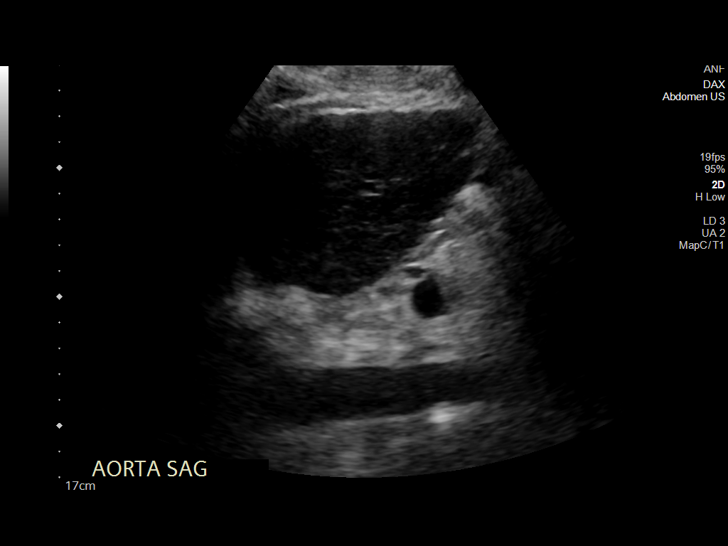
[im 5/24]
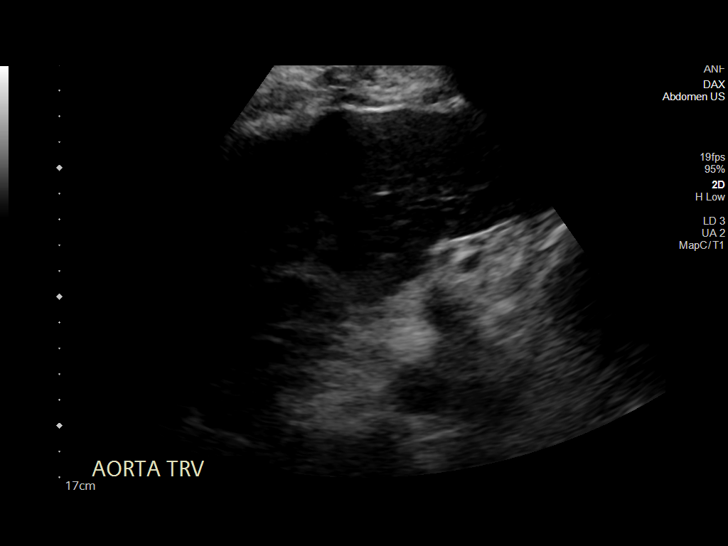
[im 7/24]
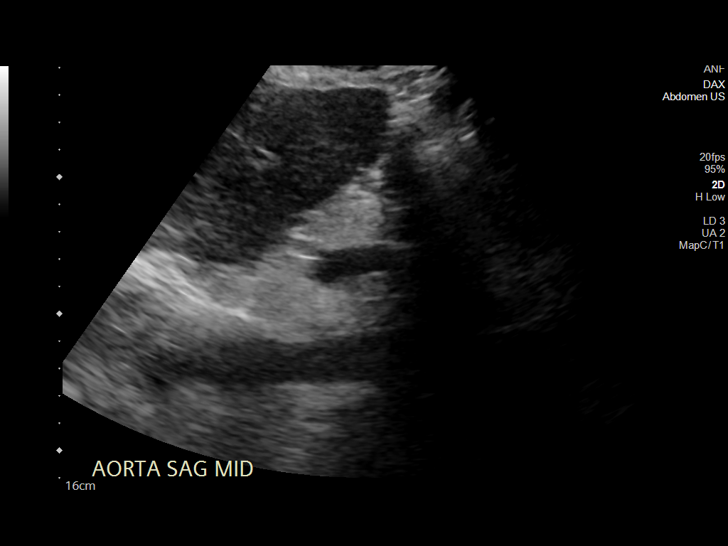
[im 8/24]
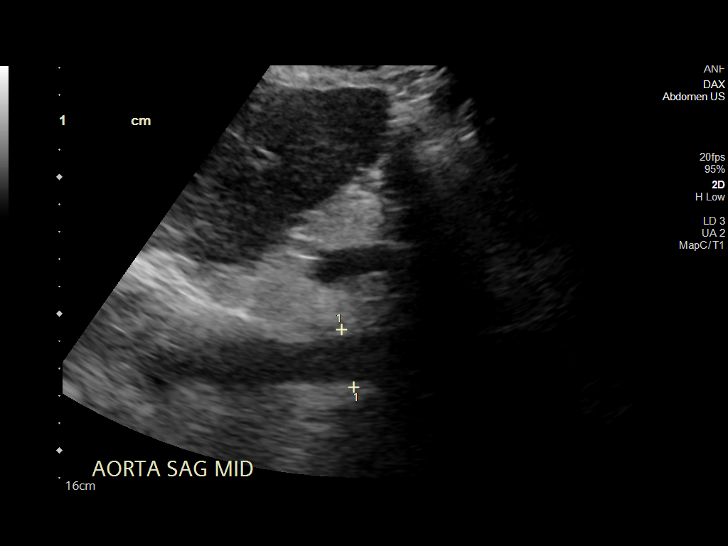
[im 10/24]
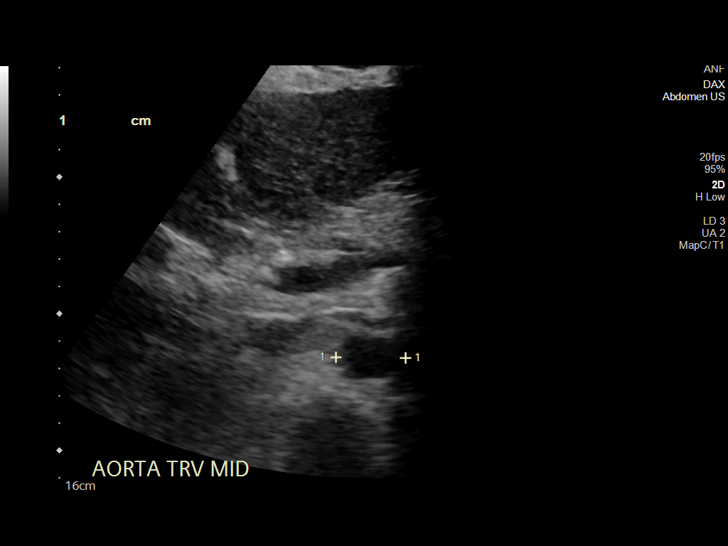
[im 12/24]
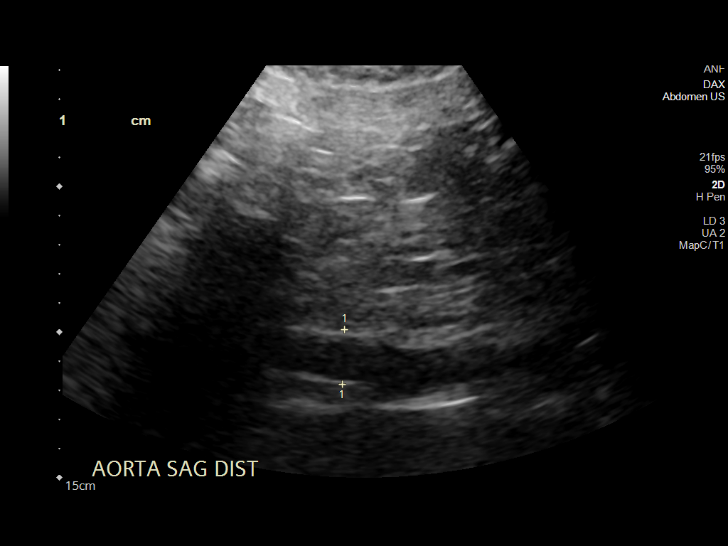
[im 13/24]
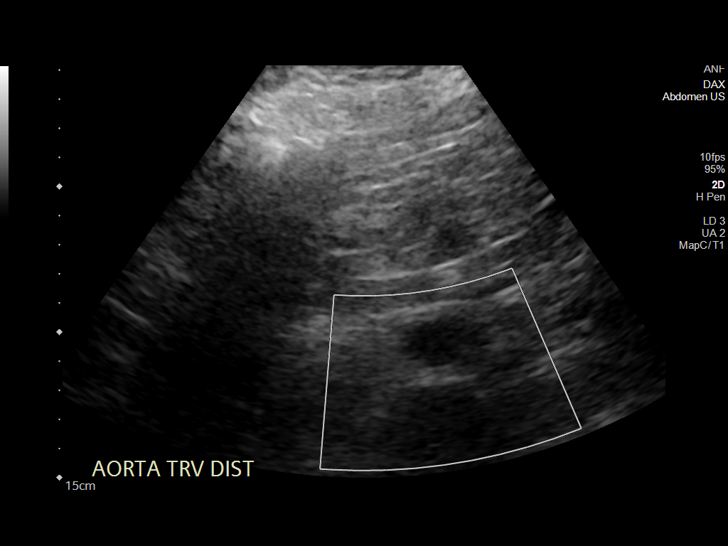
[im 15/24]
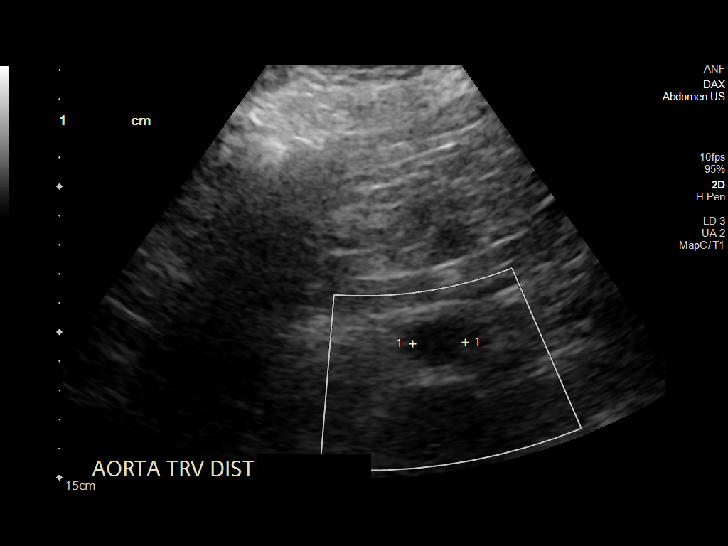
[im 17/24]
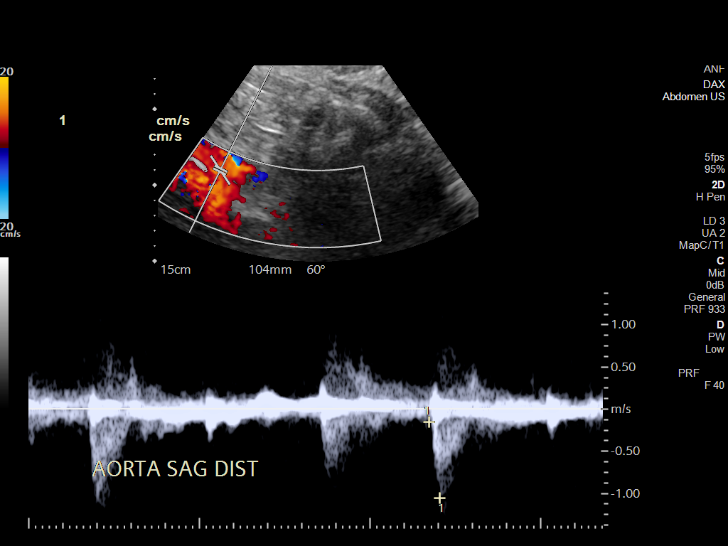
[im 19/24]
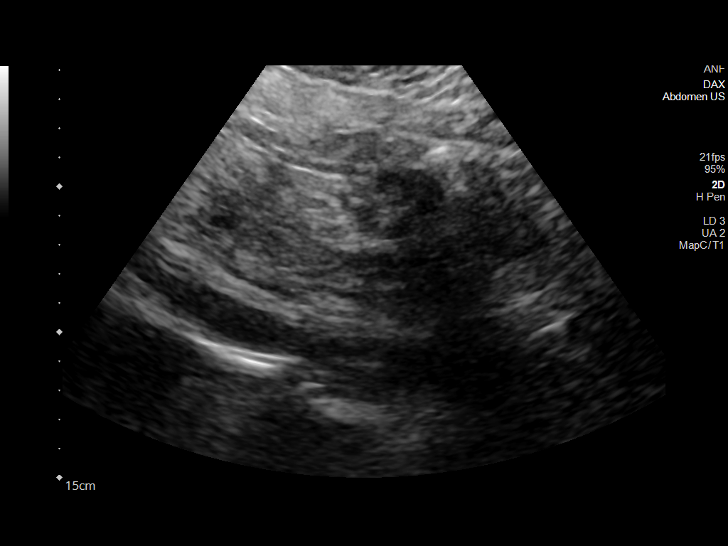
[im 20/24]
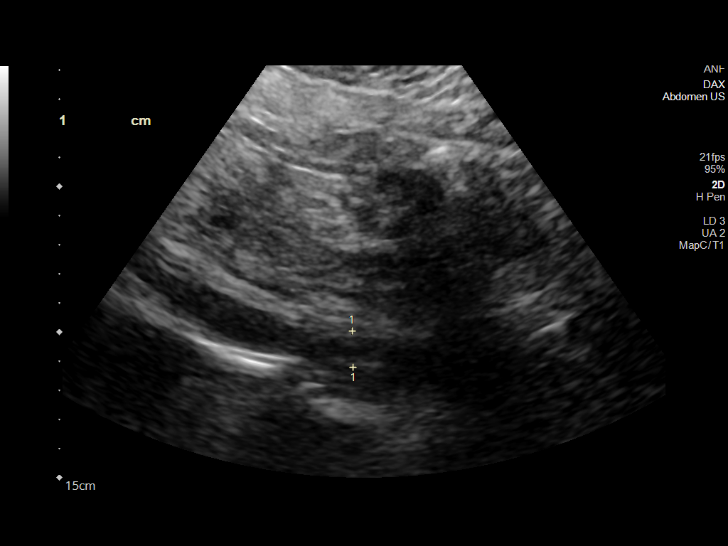
[im 22/24]
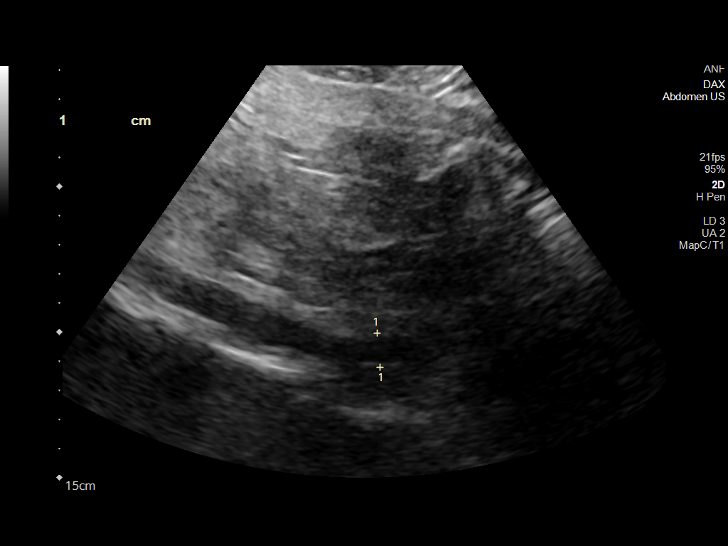
[im 24/24]
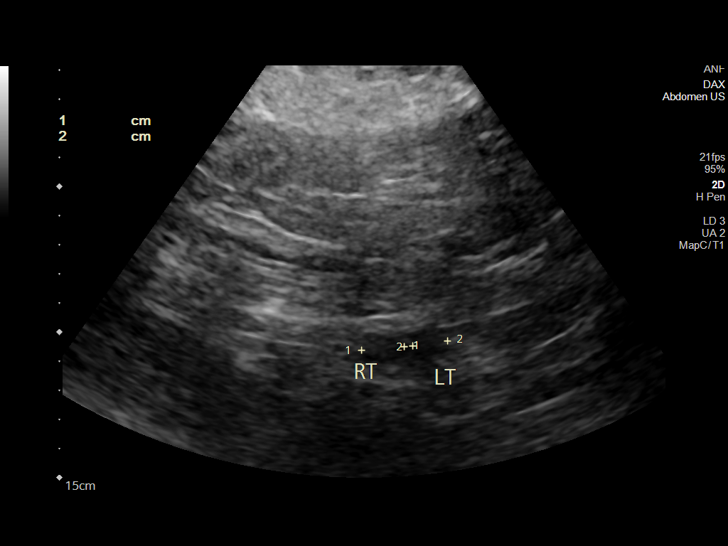

[14 of 24 positions shown; findings below may reference images not displayed]

FINDINGS: Abdominal aortic measurements as follows:

Proximal:  2.6 x 2.6 cm

Mid:  2.2 x 2.6 cm

Distal:  1.9 x 1.8 cm
Patent: Yes, peak systolic velocity is 184.7 cm/s

Right common iliac artery: 1.3 x 1.5 cm

Left common iliac artery: 1.2 x 1.2 cm

Study quality is degraded by patient motion and overlying bowel gas.
IMPRESSION: 1. No evidence for abdominal aortic aneurysm.
2. Ectatic abdominal aorta at risk for aneurysm development.
Recommend followup by ultrasound in 5 years. This recommendation
follows ACR consensus guidelines: White Paper of the ACR Incidental
Findings Committee II on Vascular Findings. J Am Coll.

## 2020-01-09 MED ORDER — RENA-VITE PO TABS
1.0000 | ORAL_TABLET | Freq: Every day | ORAL | Status: DC
  Filled 2020-01-09 (×2): qty 1

## 2020-01-09 MED ORDER — SODIUM ZIRCONIUM CYCLOSILICATE 10 G PO PACK
10.0000 g | PACK | Freq: Once | ORAL | Status: AC
  Administered 2020-01-09: 10 g via ORAL
  Filled 2020-01-09: qty 1

## 2020-01-09 MED ORDER — HYDROMORPHONE HCL 1 MG/ML IJ SOLN: 0.5000 mg | Freq: Once | INTRAMUSCULAR | Status: AC | PRN

## 2020-01-09 MED ORDER — HYDROMORPHONE HCL 1 MG/ML IJ SOLN
INTRAMUSCULAR | Status: AC
  Administered 2020-01-09: 0.5 mg via INTRAVENOUS
  Filled 2020-01-09: qty 1

## 2020-01-09 MED ORDER — PROSOURCE PLUS PO LIQD
30.0000 mL | Freq: Two times a day (BID) | ORAL | Status: DC
  Administered 2020-01-09: 30 mL via ORAL
  Filled 2020-01-09 (×3): qty 30
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
## 2020-01-09 NOTE — Progress Notes (Addendum)
[facility] Progress Note   Subjective: Patient is awake, oriented to person and place but agitated, appears confused. Says no one has been in his room to check on him all day but RN just walked out of room. C/O pain but refusing tylenol, demanding ASA. Got dilaudid at 0500 this AM. May be contributing to agitation. Notified primary.   Objective Vitals:   01/08/20 1658 01/08/20 2038 01/09/20 0427 01/09/20 0917  BP: (!) 178/77 (!) 166/71 (!) 166/68 (!) 158/71  Pulse: 89 98 98 90  Resp: 18 20 16 18   Temp: 98.6 F (37 C) 98.7 F (37.1 C) 98.2 F (36.8 C) 98.4 F (36.9 C)  TempSrc: Oral Oral Oral Oral  SpO2: 97% 95% 100% 98%  Weight:      Height:       Physical Exam General: Pleasant older male in NAD Heart: S1, S2 RRR Lungs: CTAB Abdomen: S, NT Extremities: No LE edema Dialysis Access: L AVF + bruit     Additional Objective Labs: Basic Metabolic Panel: Recent Labs  Lab 01/07/20 0720 01/08/20 0210 01/09/20 0307  NA 130* 135 135  K 5.3* 4.0 5.6*  CL 93* 94* 94*  CO2 16* 22 20*  GLUCOSE 353* 245* 179*  BUN 130* 75* 102*  CREATININE 14.21* 9.11* 10.85*  CALCIUM 8.4* 8.2* 8.0*  PHOS  --   --  8.6*   Liver Function Tests: Recent Labs  Lab 01/04/20 1213 01/09/20 0307  AST 21  --   ALT 20  --   ALKPHOS 84  --   BILITOT 1.1  --   PROT 9.1*  --   ALBUMIN 3.2* 2.0*   No results for input(s): LIPASE, AMYLASE in the last 168 hours. CBC: Recent Labs  Lab 01/04/20 1213 01/04/20 1213 01/05/20 0502 01/05/20 0502 01/06/20 0258 01/06/20 1542 01/07/20 0720 01/08/20 0210 01/09/20 0451  WBC 25.3*   < > 24.1*   < > 25.8*  --  16.8* 15.4* 18.8*  NEUTROABS 22.5*  --   --   --   --   --   --   --   --   HGB 10.0*   < > 8.8*   < > 8.7*   < > 8.7* 9.6* 10.0*  HCT 28.6*   < > 25.3*   < > 24.9*   < > 25.0* 27.4* 28.7*  MCV 75.5*   < > 74.4*  --  73.5*  --  74.4* 74.5* 76.1*  PLT 333   < > 266   < > 290  --  223 232 270   < > = values in this interval  not displayed.   Blood Culture    Component Value Date/Time   SDES BLOOD RIGHT HAND 01/07/2020 1926   SPECREQUEST  01/07/2020 1926    BOTTLES DRAWN AEROBIC AND ANAEROBIC Blood Culture adequate volume   CULT  01/07/2020 1926    NO GROWTH 2 DAYS Performed at [facility], [place] [address], [place], [place] [address]    REPTSTATUS PENDING 01/07/2020 1926    Cardiac Enzymes: No results for input(s): CKTOTAL, CKMB, CKMBINDEX, TROPONINI in the last 168 hours. CBG: Recent Labs  Lab 01/08/20 1124 01/08/20 1658 01/08/20 2048 01/09/20 0027 01/09/20 0424  GLUCAP 167* 131* 148* 172* 166*   Iron Studies: No results for input(s): IRON, TIBC, TRANSFERRIN, FERRITIN in the last 72 hours. @lablastinr3 @ Studies/Results: US AORTA  Result Date: 01/09/2020 CLINICAL DATA:  Pulsatile abdominal mass. Evaluate for possible abdominal aortic  aneurysm. EXAM: ULTRASOUND OF ABDOMINAL AORTA TECHNIQUE: Ultrasound examination of the abdominal aorta and proximal common iliac arteries was performed to evaluate for aneurysm. Additional color and Doppler images of the distal aorta were obtained to document patency. COMPARISON:  CT abdomen and pelvis 03/29/2012 FINDINGS: Abdominal aortic measurements as follows: Proximal:  2.6 x 2.6 cm Mid:  2.2 x 2.6 cm Distal:  1.9 x 1.8 cm Patent: Yes, peak systolic velocity is 332.9 cm/s Right common iliac artery: 1.3 x 1.5 cm Left common iliac artery: 1.2 x 1.2 cm Study quality is degraded by patient motion and overlying bowel gas. IMPRESSION: 1. No evidence for abdominal aortic aneurysm. 2. Ectatic abdominal aorta at risk for aneurysm development. Recommend followup by ultrasound in 5 years. This recommendation follows ACR consensus guidelines: White Paper of the ACR Incidental Findings Committee II on Vascular Findings. J Am Coll. Electronically Signed   By: Nolon Nations M.D.   On: 01/09/2020 09:15   ECHOCARDIOGRAM COMPLETE  Result Date: 01/07/2020    ECHOCARDIOGRAM REPORT    Patient Name:   [name]. Date of Exam: 01/07/2020 Medical Rec #:  [id number]          Height:       71.5 in Accession #:    [id number]         Weight:       211.4 lb Date of Birth:  1951/03/18         BSA:          2.170 m Patient Age:    69 years           BP:           162/70 mmHg Patient Gender: M                  HR:           67 bpm. Exam Location:  Inpatient Procedure: 2D Echo, Cardiac Doppler and Color Doppler Indications:    Bacteremia.  History:        Patient has no prior history of Echocardiogram examinations.                 Signs/Symptoms:Murmur; Risk Factors:Current Smoker and Diabetes.                 MRSA. Cancer. ESRD.  Sonographer:    Roseanna Rainbow RDCS Referring Phys: [id number] [facility] AMIN  Sonographer Comments: Technically difficult study due to poor echo windows. Patient appears confused, had trouble stayiong still during exam. Cervical brace prevented optimal positioning and windows. IMPRESSIONS  1. Left ventricular ejection fraction, by estimation, is >75%. The left ventricle has hyperdynamic function. The left ventricle has no regional wall motion abnormalities. There is mild left ventricular hypertrophy. Left ventricular diastolic parameters are consistent with Grade II diastolic dysfunction (pseudonormalization). Elevated left atrial pressure.  2. Right ventricular systolic function is normal. The right ventricular size is normal.  3. The mitral valve is normal in structure. Trivial mitral valve regurgitation. No evidence of mitral stenosis.  4. The aortic valve is tricuspid. Aortic valve regurgitation is not visualized. Mild aortic valve sclerosis is present, with no evidence of aortic valve stenosis.  5. The inferior vena cava is normal in size with greater than 50% respiratory variability, suggesting right atrial pressure of 3 mmHg. FINDINGS  Left Ventricle: Left ventricular ejection fraction, by estimation, is >75%. The left ventricle has hyperdynamic function. The left  ventricle has no regional wall motion abnormalities. The left ventricular  internal cavity size was normal in size. There is mild left ventricular hypertrophy. Left ventricular diastolic parameters are consistent with Grade II diastolic dysfunction (pseudonormalization). Elevated left atrial pressure. Right Ventricle: The right ventricular size is normal.Right ventricular systolic function is normal. Left Atrium: Left atrial size was normal in size. Right Atrium: Right atrial size was normal in size. Pericardium: There is no evidence of pericardial effusion. Mitral Valve: The mitral valve is normal in structure. Normal mobility of the mitral valve leaflets. Trivial mitral valve regurgitation. No evidence of mitral valve stenosis. Tricuspid Valve: The tricuspid valve is normal in structure. Tricuspid valve regurgitation is trivial. No evidence of tricuspid stenosis. Aortic Valve: The aortic valve is tricuspid. Aortic valve regurgitation is not visualized. Mild aortic valve sclerosis is present, with no evidence of aortic valve stenosis. Pulmonic Valve: The pulmonic valve was not well visualized. Pulmonic valve regurgitation is not visualized. No evidence of pulmonic stenosis. Aorta: The aortic root is normal in size and structure. Venous: The inferior vena cava is normal in size with greater than 50% respiratory variability, suggesting right atrial pressure of 3 mmHg. IAS/Shunts: No atrial level shunt detected by color flow Doppler.  LEFT VENTRICLE PLAX 2D LVIDd:         5.10 cm     Diastology LVIDs:         3.10 cm     LV e' lateral:   10.10 cm/s LV PW:         1.40 cm     LV E/e' lateral: 11.3 LV IVS:        1.00 cm     LV e' medial:    6.64 cm/s LVOT diam:     2.10 cm     LV E/e' medial:  17.2 LV SV:         105 LV SV Index:   48 LVOT Area:     3.46 cm  LV Volumes (MOD) LV vol d, MOD A2C: 71.2 ml LV vol d, MOD A4C: 90.8 ml LV vol s, MOD A2C: 23.8 ml LV vol s, MOD A4C: 22.6 ml LV SV MOD A2C:     47.4 ml LV SV MOD  A4C:     90.8 ml LV SV MOD BP:      61.5 ml RIGHT VENTRICLE             IVC RV S prime:     17.00 cm/s  IVC diam: 1.50 cm TAPSE (M-mode): 3.1 cm LEFT ATRIUM           Index       RIGHT ATRIUM           Index LA diam:      3.00 cm 1.38 cm/m  RA Area:     12.30 cm LA Vol (A2C): 21.2 ml 9.77 ml/m  RA Volume:   23.40 ml  10.78 ml/m LA Vol (A4C): 47.8 ml 22.03 ml/m  AORTIC VALVE LVOT Vmax:   151.00 cm/s LVOT Vmean:  88.600 cm/s LVOT VTI:    0.302 m  AORTA Ao Root diam: 3.30 cm MITRAL VALVE MV Area (PHT): 3.06 cm     SHUNTS MV Decel Time: 248 msec     Systemic VTI:  0.30 m MV E velocity: 114.00 cm/s  Systemic Diam: 2.10 cm MV A velocity: 111.00 cm/s MV E/A ratio:  1.03 Kirk Ruths MD Electronically signed by Kirk Ruths MD Signature Date/Time: 01/07/2020/3:10:33 PM    Final    Medications:  sodium chloride  10 mL/hr at 01/06/20 1304   [START ON 01/10/2020] vancomycin      amLODipine  10 mg Oral QHS   atorvastatin  20 mg Oral QHS   Chlorhexidine Gluconate Cloth  6 each Topical Q0600   Chlorhexidine Gluconate Cloth  6 each Topical Q0600   insulin aspart  0-6 Units Subcutaneous Q4H   insulin detemir  20 Units Subcutaneous QHS   metoprolol tartrate  50 mg Oral BID     Dialysis Orders: Center:[facility] MWF. 4.15 hrs 180NRe EDW99 kg 2K/2Ca400/800  RIJ TDC (removed 01/07/20) using LUE AVF -No heparin -Calcitriol 1.54mcg po/HD -Micera 150 mcg IV q 2 weeks last given on 12/29/19  Assessment/Plan: 1.Epidural abscess - Etiology unclear. IR unable to aspirate. MRI showed abscess extending from C4-T2 with cord compression.s/pC4-T2 laminectomy and fusion for decompression8/5/21 with improved pain.  2.MRSA bacteremia - ID consulted - recommend Vanc, to have TTE. Repeat [facility] 08/06 NGTD. TTE done 08/06 without mention of vegetation on valves.UsingLU AVFfor dialysis. TDC removed 01/07/2020 per IR.  3. 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 Plan per Neurosurgery. 4. ESRD- on HD MWF. Next HD 01/10/2020. SCr 10.6 BUN 102. Do 1st shift. K+5.6. Give lokelma 10 gram PO today X 1 dose.  5. Anemiaof CKD-Hgb 10 08/08/201. ESA recently dosed. Follow trends, transfuse prn.  6. Secondary hyperparathyroidism- CCA 9.6l. PO4 elevated. Has not been on binders as OP. Start Renvela 800 mg 2 tabs PO TID AC.  Continue VDRA.Marland Kitchen  7.HTN/volume- BP elevated. Current weights under OP EDW. Will need lower EDW upon discharge. BP elevated.Resumed home meds. Lower volume as tolerated.   8. Nutrition- Renal/Carb mod diet. Albumin low.Add protein supps. 9. New onset of confusion: Notified primary. Reviewed medications, not on muscle relaxers (Avoid Baclofen in ESRD patients!) did get dilaudid early this AM.   Jimmye Norman. Brown NP-C 01/09/2020, 11:11 AM  [facility] [phone]  Pt seen, examined and agree w A/P as above. Some agitation this am, may be medication-related (dilaudid IV this am), have d/w primary team to watch for medication side effects.  BUN ~100 which may be contributing in part. Will have HD 1st shift tomorrow.   Kelly Splinter  MD 01/09/2020, 2:08 PM

## 2020-01-09 NOTE — Progress Notes (Signed)
Pt refused all night time meds. RN explained the importance of medications, but pt still refused and he stated that he has that right to do so.   Eleanora Neighbor, RN

## 2020-01-09 NOTE — Progress Notes (Signed)
PROGRESS NOTE    [name].  NGE:[id number] DOB: 12-21-1950 DOA: 01/04/2020 PCP: [facility]   Brief Narrative: Rodney Temme. is a 69 y.o. malewithhistory of ESRD on hemodialysis on Monday Wednesday Friday, hypertension, diabetes mellitus type 2, anemia history of colon cancer status post resection. Patient presented secondary to worsening headache and back pain and found to have an epidural abscess with associated cord compression in addition to MRSA positive bacteremia. Vancomycin started and neurosurgery consulted for management.   Assessment & Plan:   Principal Problem:   MRSA bacteremia Active Problems:   TOBACCO ABUSE   OBSTRUCTIVE SLEEP APNEA   Colon cancer, status Post resection    HYPERTENSION   ESRD (end stage renal disease) ([place])   Epidural abscess   Acute encephalopathy   Sepsis ([place])   Bacteremia associated with intravascular line ([place])   Sepsis Present on admission and secondary to bacteremia and epidural abscess. Associated leukocytosis which is currently stable. Afebrile. Patient started empirically on Vancomycin IV.  Cervical cord compression Epidural abscess Upper/lower extremity weakness Neurosurgery consulted on admission and patient is s/p  C4-T2 laminectomy and fusion for decompression on 8/5. MRSA in wound culture -Neurosurgery recommendations: collar while upright -Antibiotics below -PT/OT recommending SNF  MRSA bacteremia Noted on admission blood cultures. Patient started on Vancomycin IV empirically. Infectious disease consulted. TDC removed. Transthoracic Echocardiogram without mention of vegetation. -Infectious disease recommendations: Vancomycin/Ancef  Leukocytosis In setting of above but is trended up slightly. -Trend CBC, if continues to trend up, will need to assess for new/persistent infection  ESRD on HD BUN and potassium elevated -Nephrology management  Anemia of chronic disease In setting of kidney  disease. Stable.  Essential hypertension On amlodipine and metoprolol as an outpatient -Continue amlodipine and metoprolol  Diabetes mellitus, type 2 Patient is on Levemir 24 units qhs as an outpatient. Placed on SSI q4 hours while NPO -Continue SSI -Levemir at dose of 20 units qhs  History of colon cancer S/p resection.  Hyperlipidemia -Continue Lipitor  Ectatic abdominal aorta Aortic ultrasound obtained for pulsatile mass. In setting of smoker. Recommendations for repeat ultrasound in 5 years.  Confusion Might be in connection to receiving IV dilaudid vs elevated BUN. -Monitor for resolution   DVT prophylaxis: SCDs Code Status:   Code Status: Full Code Family Communication: None at bedside Disposition Plan: Discharge pending continued workup and management of epidural abscess and bacteremia   Consultants:   Neurosurgery  Nephrology  Infectious disease  Procedures:   HEMODIALYSIS  CERVICAL FOUR- THORACIC TWO LAMINECTOMY AND FUSION (01/06/2020)  TRANSTHORACIC ECHOCARDIOGRAM (01/07/2020)  Antimicrobials:  Vancomycin  Ancef   Subjective: No significant issues. States he did not eat. Nursing confirms patient had a tray but that he spilled his food.  Objective: Vitals:   01/08/20 1658 01/08/20 2038 01/09/20 0427 01/09/20 0917  BP: (!) 178/77 (!) 166/71 (!) 166/68 (!) 158/71  Pulse: 89 98 98 90  Resp: 18 20 16 18   Temp: 98.6 F (37 C) 98.7 F (37.1 C) 98.2 F (36.8 C) 98.4 F (36.9 C)  TempSrc: Oral Oral Oral Oral  SpO2: 97% 95% 100% 98%  Weight:      Height:        Intake/Output Summary (Last 24 hours) at 01/09/2020 1428 Last data filed at 01/09/2020 0929 Gross per 24 hour  Intake 420 ml  Output 1250 ml  Net -830 ml   Filed Weights   01/07/20 0846 01/07/20 1251 01/07/20 2100  Weight: 95.9 kg 93.9 kg 97.4  kg    Examination:  General exam: Appears calm and comfortable Respiratory system: Clear to auscultation. Respiratory effort  normal. Cardiovascular system: S1 & S2 heard, RRR. No murmurs, rubs, gallops or clicks. Gastrointestinal system: Abdomen is nondistended, soft and nontender. No organomegaly or masses felt. Normal bowel sounds heard. Central nervous system: Alert and oriented to person and place. No focal neurological deficits. Musculoskeletal: No calf tenderness Skin: No cyanosis. No rashes   Data Reviewed: I have personally reviewed following labs and imaging studies  CBC Lab Results  Component Value Date   WBC 18.8 (H) 01/09/2020   RBC 3.77 (L) 01/09/2020   HGB 10.0 (L) 01/09/2020   HCT 28.7 (L) 01/09/2020   MCV 76.1 (L) 01/09/2020   MCH 26.5 01/09/2020   PLT 270 01/09/2020   MCHC 34.8 01/09/2020   RDW 17.2 (H) 01/09/2020   LYMPHSABS 1.0 01/04/2020   MONOABS 1.5 (H) 01/04/2020   EOSABS 0.0 01/04/2020   BASOSABS 0.0 50/38/8828     Last metabolic panel Lab Results  Component Value Date   NA 135 01/09/2020   K 5.6 (H) 01/09/2020   CL 94 (L) 01/09/2020   CO2 20 (L) 01/09/2020   BUN 102 (H) 01/09/2020   CREATININE 10.85 (H) 01/09/2020   GLUCOSE 179 (H) 01/09/2020   GFRNONAA 4 (L) 01/09/2020   GFRAA 5 (L) 01/09/2020   CALCIUM 8.0 (L) 01/09/2020   PHOS 8.6 (H) 01/09/2020   PROT 9.1 (H) 01/04/2020   ALBUMIN 2.0 (L) 01/09/2020   BILITOT 1.1 01/04/2020   ALKPHOS 84 01/04/2020   AST 21 01/04/2020   ALT 20 01/04/2020   ANIONGAP 21 (H) 01/09/2020    CBG (last 3)  Recent Labs    01/09/20 0027 01/09/20 0424 01/09/20 1134  GLUCAP 172* 166* 243*     GFR: Estimated Creatinine Clearance: 7.8 mL/min (A) (by C-G formula based on SCr of 10.85 mg/dL (H)).  Coagulation Profile: Recent Labs  Lab 01/04/20 1213  INR 1.3*    Recent Results (from the past 240 hour(s))  Culture, blood (routine x 2)     Status: Abnormal   Collection Time: 01/04/20 12:13 PM   Specimen: BLOOD RIGHT HAND  Result Value Ref Range Status   Specimen Description   Final    BLOOD RIGHT HAND Performed at  [facility] [address] [place], [place] [address]    Special Requests   Final    BOTTLES DRAWN AEROBIC AND ANAEROBIC Blood Culture adequate volume Performed at [facility] [address], [place], [place] [address]    Culture  Setup Time   Final    GRAM POSITIVE COCCI IN BOTH AEROBIC AND ANAEROBIC BOTTLES CRITICAL VALUE NOTED.  VALUE IS CONSISTENT WITH PREVIOUSLY REPORTED AND CALLED VALUE.    Culture (A)  Final    STAPHYLOCOCCUS AUREUS SUSCEPTIBILITIES PERFORMED ON PREVIOUS CULTURE WITHIN THE LAST 5 DAYS. Performed at [facility], [place] [address], [place], [place] [address]    Report Status 01/07/2020 FINAL  Final  Culture, blood (routine x 2)     Status: Abnormal   Collection Time: 01/04/20 12:13 PM   Specimen: Site Not Specified; Blood  Result Value Ref Range Status   Specimen Description   Final    SITE NOT SPECIFIED Performed at [facility] [address] [place], [place] [address]    Special Requests   Final    BOTTLES DRAWN AEROBIC AND ANAEROBIC Blood Culture adequate volume Performed at [facility]  [place] [address] [place], [place] [address]    Culture  Setup Time   Final    GRAM POSITIVE COCCI IN BOTH AEROBIC AND ANAEROBIC BOTTLES Organism ID to follow CRITICAL RESULT CALLED TO, READ BACK BY AND VERIFIED WITH: D. WOFFORD PHARMD, AT 0745 01/05/20 BY Rush Landmark Performed at [facility], [place] [address] [place], [place] [address]    Culture METHICILLIN RESISTANT STAPHYLOCOCCUS AUREUS (A)  Final   Report Status 01/07/2020 FINAL  Final   Organism ID, Bacteria METHICILLIN RESISTANT STAPHYLOCOCCUS AUREUS  Final      Susceptibility   Methicillin resistant staphylococcus aureus - MIC*    CIPROFLOXACIN >=8 RESISTANT Resistant     ERYTHROMYCIN >=8 RESISTANT Resistant     GENTAMICIN <=0.5 SENSITIVE Sensitive     OXACILLIN >=4 RESISTANT Resistant     TETRACYCLINE <=1 SENSITIVE  Sensitive     VANCOMYCIN <=0.5 SENSITIVE Sensitive     TRIMETH/SULFA <=10 SENSITIVE Sensitive     CLINDAMYCIN <=0.25 SENSITIVE Sensitive     RIFAMPIN <=0.5 SENSITIVE Sensitive     Inducible Clindamycin NEGATIVE Sensitive     * METHICILLIN RESISTANT STAPHYLOCOCCUS AUREUS  Blood Culture ID Panel (Reflexed)     Status: Abnormal   Collection Time: 01/04/20 12:13 PM  Result Value Ref Range Status   Enterococcus faecalis NOT DETECTED NOT DETECTED Final   Enterococcus Faecium NOT DETECTED NOT DETECTED Final   Listeria monocytogenes NOT DETECTED NOT DETECTED Final   Staphylococcus species DETECTED (A) NOT DETECTED Final    Comment: CRITICAL RESULT CALLED TO, READ BACK BY AND VERIFIED WITH: D. WOFFORD PHARMD, AT 0745 01/05/20 BY D. VANHOOK    Staphylococcus aureus (BCID) DETECTED (A) NOT DETECTED Final    Comment: Methicillin (oxacillin)-resistant Staphylococcus aureus (MRSA). MRSA is predictably resistant to beta-lactam antibiotics (except ceftaroline). Preferred therapy is vancomycin unless clinically contraindicated. Patient requires contact precautions if  hospitalized. CRITICAL RESULT CALLED TO, READ BACK BY AND VERIFIED WITH: D. WOFFORD PHARMD, AT 0745 01/05/20 BY D. VANHOOK    Staphylococcus epidermidis NOT DETECTED NOT DETECTED Final   Staphylococcus lugdunensis NOT DETECTED NOT DETECTED Final   Streptococcus species NOT DETECTED NOT DETECTED Final   Streptococcus agalactiae NOT DETECTED NOT DETECTED Final   Streptococcus pneumoniae NOT DETECTED NOT DETECTED Final   Streptococcus pyogenes NOT DETECTED NOT DETECTED Final   A.calcoaceticus-baumannii NOT DETECTED NOT DETECTED Final   Bacteroides fragilis NOT DETECTED NOT DETECTED Final   Enterobacterales NOT DETECTED NOT DETECTED Final   Enterobacter cloacae complex NOT DETECTED NOT DETECTED Final   Escherichia coli NOT DETECTED NOT DETECTED Final   Klebsiella aerogenes NOT DETECTED NOT DETECTED Final   Klebsiella oxytoca NOT DETECTED NOT  DETECTED Final   Klebsiella pneumoniae NOT DETECTED NOT DETECTED Final   Proteus species NOT DETECTED NOT DETECTED Final   Salmonella species NOT DETECTED NOT DETECTED Final   Serratia marcescens NOT DETECTED NOT DETECTED Final   Haemophilus influenzae NOT DETECTED NOT DETECTED Final   Neisseria meningitidis NOT DETECTED NOT DETECTED Final   Pseudomonas aeruginosa NOT DETECTED NOT DETECTED Final   Stenotrophomonas maltophilia NOT DETECTED NOT DETECTED Final   Candida albicans NOT DETECTED NOT DETECTED Final   Candida auris NOT DETECTED NOT DETECTED Final   Candida glabrata NOT DETECTED NOT DETECTED Final   Candida krusei NOT DETECTED NOT DETECTED Final   Candida parapsilosis NOT DETECTED NOT DETECTED Final   Candida tropicalis NOT DETECTED NOT DETECTED Final   Cryptococcus neoformans/gattii NOT DETECTED NOT DETECTED Final  Meth resistant mecA/C and MREJ DETECTED (A) NOT DETECTED Final    Comment: CRITICAL RESULT CALLED TO, READ BACK BY AND VERIFIED WITH: D. WOFFORD PHARMD, AT 0745 01/05/20 BY Rush Landmark Performed at [facility], [place] [address] [place], [place] [address]   SARS Coronavirus 2 by RT PCR (hospital order, performed in [facility] hospital lab) Nasopharyngeal Nasopharyngeal Swab     Status: None   Collection Time: 01/04/20  2:45 PM   Specimen: Nasopharyngeal Swab  Result Value Ref Range Status   SARS Coronavirus 2 NEGATIVE NEGATIVE Final    Comment: (NOTE) SARS-CoV-2 target nucleic acids are NOT DETECTED.  The SARS-CoV-2 RNA is generally detectable in upper and lower respiratory specimens during the acute phase of infection. The lowest concentration of SARS-CoV-2 viral copies this assay can detect is 250 copies / mL. A negative result does not preclude SARS-CoV-2 infection and should not be used as the sole basis for treatment or other patient management decisions.  A negative result may occur with improper specimen collection / handling, submission of specimen  other than nasopharyngeal swab, presence of viral mutation(s) within the areas targeted by this assay, and inadequate number of viral copies (<250 copies / mL). A negative result must be combined with clinical observations, patient history, and epidemiological information.  Fact Sheet for Patients:   StrictlyIdeas.no  Fact Sheet for Healthcare Providers: BankingDealers.co.za  This test is not yet approved or  cleared by the [place] FDA and has been authorized for detection and/or diagnosis of SARS-CoV-2 by FDA under an Emergency Use Authorization (EUA).  This EUA will remain in effect (meaning this test can be used) for the duration of the COVID-19 declaration under Section 564(b)(1) of the Act, 21 U.S.C. section 360bbb-3(b)(1), unless the authorization is terminated or revoked sooner.  Performed at [facility], [place] [address], [place], [place] [address]   Urine culture     Status: None   Collection Time: 01/04/20  9:20 PM   Specimen: Urine, Random  Result Value Ref Range Status   Specimen Description   Final    URINE, RANDOM Performed at [facility] [address] [place], [place] [address]    Special Requests   Final    NONE Performed at [facility], [place] [address] [place], [place] [address]    Culture   Final    NO GROWTH Performed at [facility], [place] [address] [place], [place] [address]    Report Status 01/05/2020 FINAL  Final  Aerobic/Anaerobic Culture (surgical/deep wound)     Status: None (Preliminary result)   Collection Time: 01/06/20  2:52 PM   Specimen: Soft Tissue, Other  Result Value Ref Range Status   Specimen Description WOUND  Final   Special Requests POSTOP CERVICAL LAMINECTOMY AND FUSION SITE  Final   Gram Stain   Final    NO WBC SEEN ABUNDANT GRAM POSITIVE COCCI Performed at [facility], [place] [address] [place],  [place] [address]    Culture   Final    MODERATE METHICILLIN RESISTANT STAPHYLOCOCCUS AUREUS NO ANAEROBES ISOLATED; CULTURE IN PROGRESS FOR 5 DAYS    Report Status PENDING  Incomplete   Organism ID, Bacteria METHICILLIN RESISTANT STAPHYLOCOCCUS AUREUS  Final      Susceptibility   Methicillin resistant staphylococcus aureus - MIC*    CIPROFLOXACIN >=8 RESISTANT Resistant     ERYTHROMYCIN >=8 RESISTANT Resistant     GENTAMICIN <=0.5 SENSITIVE Sensitive  OXACILLIN >=4 RESISTANT Resistant     TETRACYCLINE <=1 SENSITIVE Sensitive     VANCOMYCIN 1 SENSITIVE Sensitive     TRIMETH/SULFA <=10 SENSITIVE Sensitive     CLINDAMYCIN <=0.25 SENSITIVE Sensitive     RIFAMPIN <=0.5 SENSITIVE Sensitive     Inducible Clindamycin NEGATIVE Sensitive     * MODERATE METHICILLIN RESISTANT STAPHYLOCOCCUS AUREUS  Culture, blood (routine x 2)     Status: None (Preliminary result)   Collection Time: 01/07/20  7:10 AM   Specimen: BLOOD  Result Value Ref Range Status   Specimen Description BLOOD RIGHT ANTECUBITAL  Final   Special Requests   Final    BOTTLES DRAWN AEROBIC ONLY Blood Culture results may not be optimal due to an inadequate volume of blood received in culture bottles   Culture   Final    NO GROWTH 2 DAYS Performed at [facility], [place] [address] [place], [place] [address]    Report Status PENDING  Incomplete  Culture, blood (routine x 2)     Status: None (Preliminary result)   Collection Time: 01/07/20  7:21 AM   Specimen: BLOOD RIGHT HAND  Result Value Ref Range Status   Specimen Description BLOOD RIGHT HAND  Final   Special Requests   Final    BOTTLES DRAWN AEROBIC AND ANAEROBIC Blood Culture results may not be optimal due to an excessive volume of blood received in culture bottles   Culture   Final    NO GROWTH 2 DAYS Performed at [facility], [place] [address], [place], [place] [address]    Report Status PENDING  Incomplete  Culture, blood (Routine X 2) w Reflex to ID Panel      Status: None (Preliminary result)   Collection Time: 01/07/20  7:15 PM   Specimen: BLOOD  Result Value Ref Range Status   Specimen Description BLOOD BLOOD RIGHT FOREARM  Final   Special Requests   Final    BOTTLES DRAWN AEROBIC AND ANAEROBIC Blood Culture adequate volume   Culture   Final    NO GROWTH 2 DAYS Performed at [facility], [place] [address] [place], [place] [address]    Report Status PENDING  Incomplete  Culture, blood (Routine X 2) w Reflex to ID Panel     Status: None (Preliminary result)   Collection Time: 01/07/20  7:26 PM   Specimen: BLOOD RIGHT HAND  Result Value Ref Range Status   Specimen Description BLOOD RIGHT HAND  Final   Special Requests   Final    BOTTLES DRAWN AEROBIC AND ANAEROBIC Blood Culture adequate volume   Culture   Final    NO GROWTH 2 DAYS Performed at [facility], [place] [address], [place], [place] [address]    Report Status PENDING  Incomplete        Radiology Studies: US AORTA  Result Date: 01/09/2020 CLINICAL DATA:  Pulsatile abdominal mass. Evaluate for possible abdominal aortic aneurysm. EXAM: ULTRASOUND OF ABDOMINAL AORTA TECHNIQUE: Ultrasound examination of the abdominal aorta and proximal common iliac arteries was performed to evaluate for aneurysm. Additional color and Doppler images of the distal aorta were obtained to document patency. COMPARISON:  CT abdomen and pelvis 03/29/2012 FINDINGS: Abdominal aortic measurements as follows: Proximal:  2.6 x 2.6 cm Mid:  2.2 x 2.6 cm Distal:  1.9 x 1.8 cm Patent: Yes, peak systolic velocity is 300.9 cm/s Right common iliac artery: 1.3 x 1.5 cm Left common iliac artery: 1.2 x 1.2 cm  Study quality is degraded by patient motion and overlying bowel gas. IMPRESSION: 1. No evidence for abdominal aortic aneurysm. 2. Ectatic abdominal aorta at risk for aneurysm development. Recommend followup by ultrasound in 5 years. This recommendation follows ACR consensus guidelines: White Paper of the ACR  Incidental Findings Committee II on Vascular Findings. J Am Coll. Electronically Signed   By: Nolon Nations M.D.   On: 01/09/2020 09:15   ECHOCARDIOGRAM COMPLETE  Result Date: 01/07/2020    ECHOCARDIOGRAM REPORT   Patient Name:   [name]. Date of Exam: 01/07/2020 Medical Rec #:  [id number]          Height:       71.5 in Accession #:    [id number]         Weight:       211.4 lb Date of Birth:  03-Apr-1951         BSA:          2.170 m Patient Age:    80 years           BP:           162/70 mmHg Patient Gender: M                  HR:           67 bpm. Exam Location:  Inpatient Procedure: 2D Echo, Cardiac Doppler and Color Doppler Indications:    Bacteremia.  History:        Patient has no prior history of Echocardiogram examinations.                 Signs/Symptoms:Murmur; Risk Factors:Current Smoker and Diabetes.                 MRSA. Cancer. ESRD.  Sonographer:    Roseanna Rainbow RDCS Referring Phys: [id number] [facility] AMIN  Sonographer Comments: Technically difficult study due to poor echo windows. Patient appears confused, had trouble stayiong still during exam. Cervical brace prevented optimal positioning and windows. IMPRESSIONS  1. Left ventricular ejection fraction, by estimation, is >75%. The left ventricle has hyperdynamic function. The left ventricle has no regional wall motion abnormalities. There is mild left ventricular hypertrophy. Left ventricular diastolic parameters are consistent with Grade II diastolic dysfunction (pseudonormalization). Elevated left atrial pressure.  2. Right ventricular systolic function is normal. The right ventricular size is normal.  3. The mitral valve is normal in structure. Trivial mitral valve regurgitation. No evidence of mitral stenosis.  4. The aortic valve is tricuspid. Aortic valve regurgitation is not visualized. Mild aortic valve sclerosis is present, with no evidence of aortic valve stenosis.  5. The inferior vena cava is normal in size with greater than  50% respiratory variability, suggesting right atrial pressure of 3 mmHg. FINDINGS  Left Ventricle: Left ventricular ejection fraction, by estimation, is >75%. The left ventricle has hyperdynamic function. The left ventricle has no regional wall motion abnormalities. The left ventricular internal cavity size was normal in size. There is mild left ventricular hypertrophy. Left ventricular diastolic parameters are consistent with Grade II diastolic dysfunction (pseudonormalization). Elevated left atrial pressure. Right Ventricle: The right ventricular size is normal.Right ventricular systolic function is normal. Left Atrium: Left atrial size was normal in size. Right Atrium: Right atrial size was normal in size. Pericardium: There is no evidence of pericardial effusion. Mitral Valve: The mitral valve is normal in structure. Normal mobility of the mitral valve leaflets. Trivial mitral valve regurgitation. No evidence of mitral valve  stenosis. Tricuspid Valve: The tricuspid valve is normal in structure. Tricuspid valve regurgitation is trivial. No evidence of tricuspid stenosis. Aortic Valve: The aortic valve is tricuspid. Aortic valve regurgitation is not visualized. Mild aortic valve sclerosis is present, with no evidence of aortic valve stenosis. Pulmonic Valve: The pulmonic valve was not well visualized. Pulmonic valve regurgitation is not visualized. No evidence of pulmonic stenosis. Aorta: The aortic root is normal in size and structure. Venous: The inferior vena cava is normal in size with greater than 50% respiratory variability, suggesting right atrial pressure of 3 mmHg. IAS/Shunts: No atrial level shunt detected by color flow Doppler.  LEFT VENTRICLE PLAX 2D LVIDd:         5.10 cm     Diastology LVIDs:         3.10 cm     LV e' lateral:   10.10 cm/s LV PW:         1.40 cm     LV E/e' lateral: 11.3 LV IVS:        1.00 cm     LV e' medial:    6.64 cm/s LVOT diam:     2.10 cm     LV E/e' medial:  17.2 LV SV:          105 LV SV Index:   48 LVOT Area:     3.46 cm  LV Volumes (MOD) LV vol d, MOD A2C: 71.2 ml LV vol d, MOD A4C: 90.8 ml LV vol s, MOD A2C: 23.8 ml LV vol s, MOD A4C: 22.6 ml LV SV MOD A2C:     47.4 ml LV SV MOD A4C:     90.8 ml LV SV MOD BP:      61.5 ml RIGHT VENTRICLE             IVC RV S prime:     17.00 cm/s  IVC diam: 1.50 cm TAPSE (M-mode): 3.1 cm LEFT ATRIUM           Index       RIGHT ATRIUM           Index LA diam:      3.00 cm 1.38 cm/m  RA Area:     12.30 cm LA Vol (A2C): 21.2 ml 9.77 ml/m  RA Volume:   23.40 ml  10.78 ml/m LA Vol (A4C): 47.8 ml 22.03 ml/m  AORTIC VALVE LVOT Vmax:   151.00 cm/s LVOT Vmean:  88.600 cm/s LVOT VTI:    0.302 m  AORTA Ao Root diam: 3.30 cm MITRAL VALVE MV Area (PHT): 3.06 cm     SHUNTS MV Decel Time: 248 msec     Systemic VTI:  0.30 m MV E velocity: 114.00 cm/s  Systemic Diam: 2.10 cm MV A velocity: 111.00 cm/s MV E/A ratio:  1.03 Kirk Ruths MD Electronically signed by Kirk Ruths MD Signature Date/Time: 01/07/2020/3:10:33 PM    Final         Scheduled Meds: . (feeding supplement) PROSource Plus  30 mL Oral BID BM  . amLODipine  10 mg Oral QHS  . atorvastatin  20 mg Oral QHS  . Chlorhexidine Gluconate Cloth  6 each Topical Q0600  . Chlorhexidine Gluconate Cloth  6 each Topical Q0600  . insulin aspart  0-6 Units Subcutaneous Q4H  . insulin detemir  20 Units Subcutaneous QHS  . metoprolol tartrate  50 mg Oral BID  . multivitamin  1 tablet Oral QHS   Continuous Infusions: . sodium chloride 10  mL/hr at 01/06/20 1304  . [START ON 01/10/2020] vancomycin       LOS: 5 days     Cordelia Poche, MD [facility] 01/09/2020, 2:28 PM  If 7PM-7AM, please contact night-coverage [email or web address]
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
## 2020-01-09 NOTE — Progress Notes (Signed)
   Providing Compassionate, Quality Care - Together  NEUROSURGERY PROGRESS NOTE   S: No issues overnight. Doing well  O: EXAM:  BP (!) 158/71 (BP Location: Right Arm)   Pulse 90   Temp 98.4 F (36.9 C) (Oral)   Resp 18   Ht 5' 11.5" (1.816 m)   Wt 97.4 kg   SpO2 98%   BMI 29.53 kg/m   Awake, alert, oriented  Speech fluent, appropriate  BUE 4+/5 BLE 5/5 Dressing intact   ASSESSMENT:  69 y.o. male with  POD#3 C4-T2 PCDF for spinal infection - continue C-collar when upright - pain control - pt/ot    Thank you for allowing me to participate in this patient's care.  Please do not hesitate to call with questions or concerns.   Elwin Sleight, [facility] Cell: [phone]

## 2020-01-10 ENCOUNTER — Inpatient Hospital Stay (HOSPITAL_COMMUNITY): Payer: No Typology Code available for payment source

## 2020-01-10 DIAGNOSIS — I10 Essential (primary) hypertension: Secondary | ICD-10-CM | POA: Diagnosis not present

## 2020-01-10 DIAGNOSIS — R7881 Bacteremia: Secondary | ICD-10-CM | POA: Diagnosis not present

## 2020-01-10 DIAGNOSIS — Z992 Dependence on renal dialysis: Secondary | ICD-10-CM

## 2020-01-10 DIAGNOSIS — N186 End stage renal disease: Secondary | ICD-10-CM | POA: Diagnosis not present

## 2020-01-10 DIAGNOSIS — G9340 Encephalopathy, unspecified: Secondary | ICD-10-CM

## 2020-01-10 DIAGNOSIS — G062 Extradural and subdural abscess, unspecified: Secondary | ICD-10-CM | POA: Diagnosis not present

## 2020-01-10 LAB — RENAL FUNCTION PANEL
Albumin: 2.1 g/dL — ABNORMAL LOW (ref 3.5–5.0)
Anion gap: 19 — ABNORMAL HIGH (ref 5–15)
BUN: 119 mg/dL — ABNORMAL HIGH (ref 8–23)
CO2: 21 mmol/L — ABNORMAL LOW (ref 22–32)
Calcium: 7.8 mg/dL — ABNORMAL LOW (ref 8.9–10.3)
Chloride: 91 mmol/L — ABNORMAL LOW (ref 98–111)
Creatinine, Ser: 12.16 mg/dL — ABNORMAL HIGH (ref 0.61–1.24)
GFR calc Af Amer: 4 mL/min — ABNORMAL LOW (ref >60)
GFR calc non Af Amer: 4 mL/min — ABNORMAL LOW (ref >60)
Glucose, Bld: 222 mg/dL — ABNORMAL HIGH (ref 70–99)
Phosphorus: 9.6 mg/dL — ABNORMAL HIGH (ref 2.5–4.6)
Potassium: 4.5 mmol/L (ref 3.5–5.1)
Sodium: 131 mmol/L — ABNORMAL LOW (ref 135–145)

## 2020-01-10 LAB — MAGNESIUM: Magnesium: 2.3 mg/dL (ref 1.7–2.4)

## 2020-01-10 LAB — BLOOD GAS, ARTERIAL
Acid-Base Excess: 1.7 mmol/L (ref 0.0–2.0)
Bicarbonate: 25.7 mmol/L (ref 20.0–28.0)
Drawn by: 560031
FIO2: 28
O2 Saturation: 35.4 %
Patient temperature: 37
pCO2 arterial: 39.8 mmHg (ref 32.0–48.0)
pH, Arterial: 7.426 (ref 7.350–7.450)
pO2, Arterial: <31 mmHg — CL (ref 83.0–108.0)

## 2020-01-10 LAB — HEPATIC FUNCTION PANEL
ALT: 24 U/L (ref 0–44)
AST: 27 U/L (ref 15–41)
Albumin: 1.9 g/dL — ABNORMAL LOW (ref 3.5–5.0)
Alkaline Phosphatase: 98 U/L (ref 38–126)
Bilirubin, Direct: 1.1 mg/dL — ABNORMAL HIGH (ref 0.0–0.2)
Indirect Bilirubin: 1.4 mg/dL — ABNORMAL HIGH (ref 0.3–0.9)
Total Bilirubin: 2.5 mg/dL — ABNORMAL HIGH (ref 0.3–1.2)
Total Protein: 7.6 g/dL (ref 6.5–8.1)

## 2020-01-10 LAB — GLUCOSE, CAPILLARY
Glucose-Capillary: 145 mg/dL — ABNORMAL HIGH (ref 70–99)
Glucose-Capillary: 152 mg/dL — ABNORMAL HIGH (ref 70–99)
Glucose-Capillary: 183 mg/dL — ABNORMAL HIGH (ref 70–99)
Glucose-Capillary: 189 mg/dL — ABNORMAL HIGH (ref 70–99)
Glucose-Capillary: 190 mg/dL — ABNORMAL HIGH (ref 70–99)
Glucose-Capillary: 199 mg/dL — ABNORMAL HIGH (ref 70–99)
Glucose-Capillary: 208 mg/dL — ABNORMAL HIGH (ref 70–99)

## 2020-01-10 LAB — CBC
HCT: 26.7 % — ABNORMAL LOW (ref 39.0–52.0)
Hemoglobin: 9.3 g/dL — ABNORMAL LOW (ref 13.0–17.0)
MCH: 26.6 pg (ref 26.0–34.0)
MCHC: 34.8 g/dL (ref 30.0–36.0)
MCV: 76.3 fL — ABNORMAL LOW (ref 80.0–100.0)
Platelets: 255 10*3/uL (ref 150–400)
RBC: 3.5 MIL/uL — ABNORMAL LOW (ref 4.22–5.81)
RDW: 17.2 % — ABNORMAL HIGH (ref 11.5–15.5)
WBC: 19.9 10*3/uL — ABNORMAL HIGH (ref 4.0–10.5)
nRBC: 0 % (ref 0.0–0.2)

## 2020-01-10 LAB — MRSA PCR SCREENING: MRSA by PCR: NEGATIVE

## 2020-01-10 LAB — AMMONIA: Ammonia: 24 umol/L (ref 9–35)

## 2020-01-10 IMAGING — DX DG CHEST 1V PORT
1 series · 1 of 1 positions shown · non-contrast
Comparison: Radiograph dated [DATE]

CLINICAL DATA: 68-year-old male status post central line placement.

EXAM:
PORTABLE CHEST 1 VIEW

[chest]
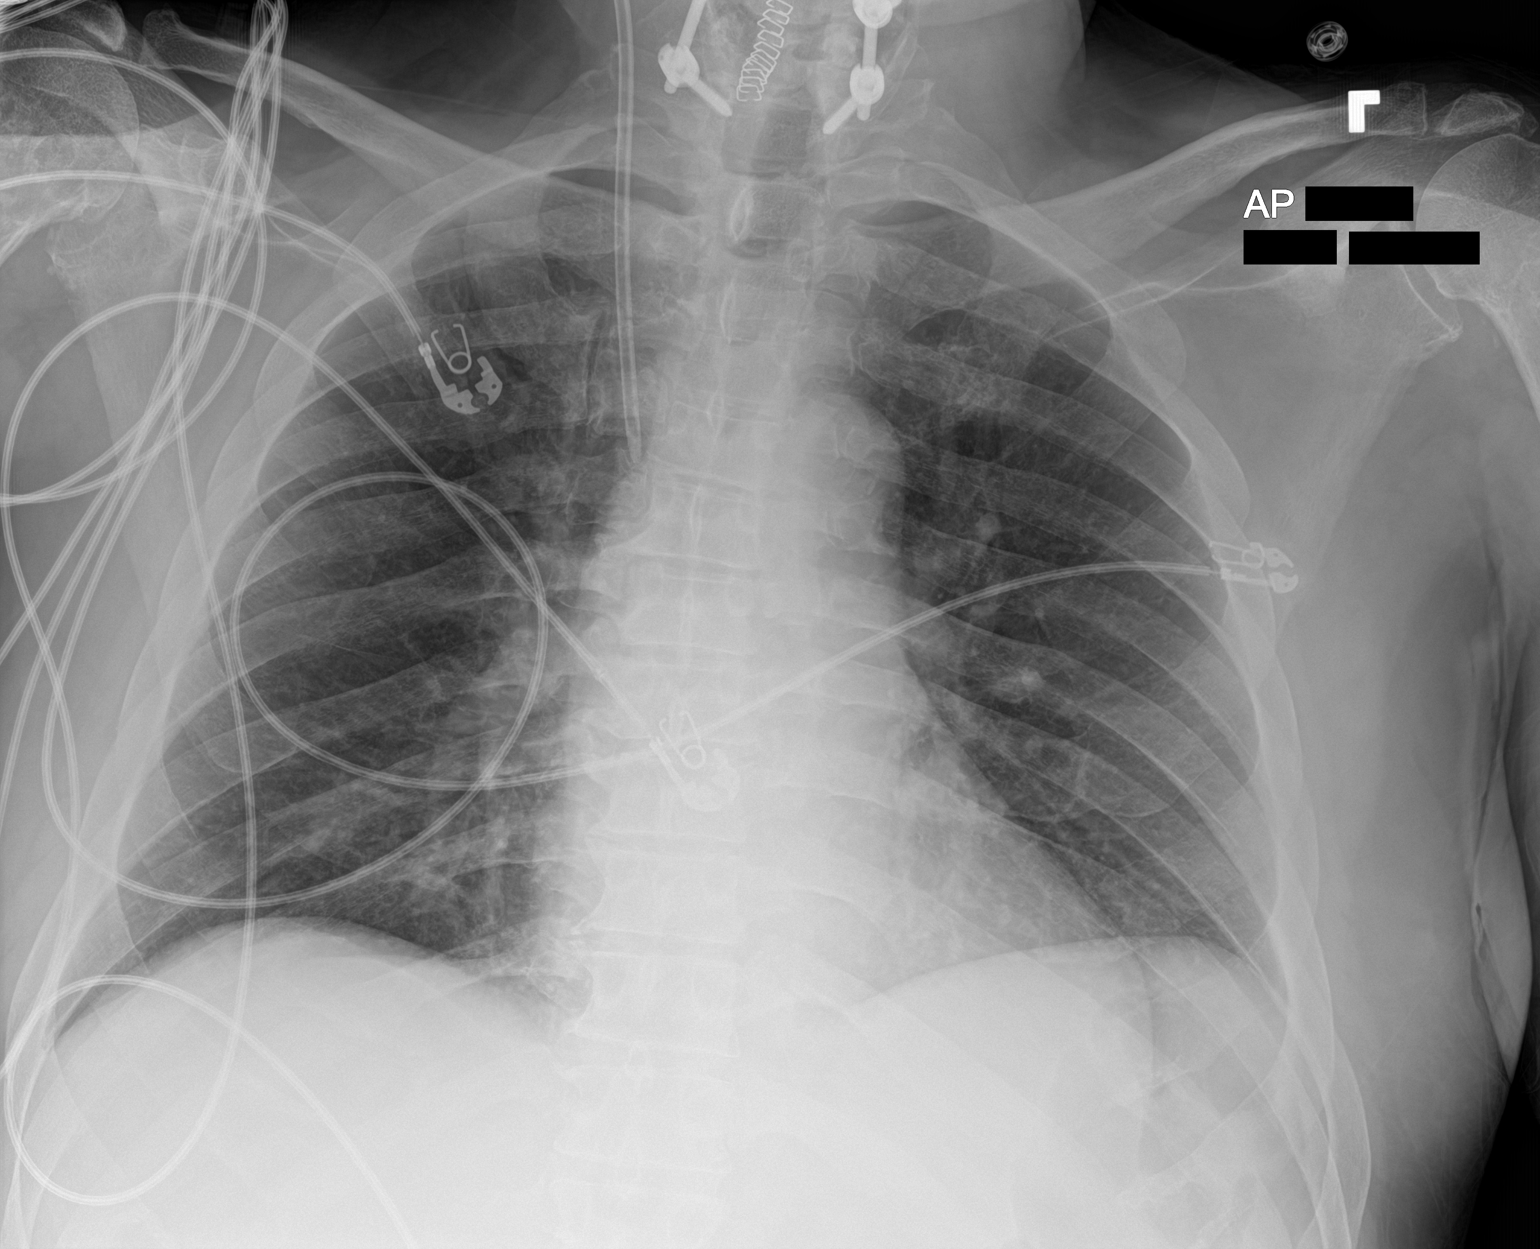

[1 of 1 positions shown; findings below may reference images not displayed]

FINDINGS: Right IJ central venous line with tip over upper SVC. No
pneumothorax. There is no focal consolidation, or pleural effusion.
Top-normal cardiac size. No acute osseous pathology. Partially
visualized lower cervical fusion.
IMPRESSION: Right IJ central venous line with tip over upper SVC. No
pneumothorax.

## 2020-01-10 MED ORDER — PHENYLEPHRINE HCL-NACL 10-0.9 MG/250ML-% IV SOLN
INTRAVENOUS | Status: AC
  Filled 2020-01-10: qty 250

## 2020-01-10 MED ORDER — ALBUMIN HUMAN 5 % IV SOLN
INTRAVENOUS | Status: AC
  Administered 2020-01-10: 25 g
  Filled 2020-01-10: qty 500

## 2020-01-10 MED ORDER — LORAZEPAM 2 MG/ML IJ SOLN
0.5000 mg | Freq: Once | INTRAMUSCULAR | Status: AC
  Administered 2020-01-10: 0.5 mg via INTRAVENOUS

## 2020-01-10 MED ORDER — VANCOMYCIN HCL IN DEXTROSE 750-5 MG/150ML-% IV SOLN
INTRAVENOUS | Status: AC
  Filled 2020-01-10: qty 150

## 2020-01-10 MED ORDER — HEPARIN SODIUM (PORCINE) 1000 UNIT/ML IJ SOLN
INTRAMUSCULAR | Status: AC
  Administered 2020-01-10: 2400 [IU]
  Filled 2020-01-10: qty 3

## 2020-01-10 MED ORDER — LORAZEPAM 2 MG/ML IJ SOLN
INTRAMUSCULAR | Status: AC
  Filled 2020-01-10: qty 1

## 2020-01-10 MED ORDER — SODIUM CHLORIDE 0.9 % IV SOLN
300.0000 mg | Freq: Two times a day (BID) | INTRAVENOUS | Status: DC
  Administered 2020-01-10 – 2020-01-13 (×7): 300 mg via INTRAVENOUS
  Filled 2020-01-10 (×10): qty 300

## 2020-01-10 MED ORDER — LORAZEPAM 2 MG/ML IJ SOLN: 0.5000 mg | Freq: Once | INTRAMUSCULAR | Status: AC

## 2020-01-10 MED ORDER — LORAZEPAM 2 MG/ML IJ SOLN: 0.5000 mg | INTRAMUSCULAR | Status: DC | PRN

## 2020-01-10 MED ORDER — LORAZEPAM 2 MG/ML IJ SOLN
INTRAMUSCULAR | Status: AC
  Administered 2020-01-10: 0.5 mg via INTRAVENOUS
  Filled 2020-01-10: qty 1

## 2020-01-10 MED ORDER — DEXMEDETOMIDINE HCL IN NACL 400 MCG/100ML IV SOLN
0.4000 ug/kg/h | INTRAVENOUS | Status: DC
  Administered 2020-01-10: 0.4 ug/kg/h via INTRAVENOUS
  Filled 2020-01-10: qty 100

## 2020-01-10 MED ORDER — PHENYLEPHRINE HCL-NACL 10-0.9 MG/250ML-% IV SOLN: 0.0000 ug/min | INTRAVENOUS | Status: DC

## 2020-01-10 NOTE — Procedures (Signed)
Central Venous Catheter Insertion Procedure Note  [name]  [id number]  Oct 05, 1950  Date:01/10/20  Time:6:12 PM   Provider Performing:Rastus Borton Viona Gilmore Heber Wausa   Procedure: Insertion of Non-tunneled Central Venous Catheter(36556)with US guidance (03403)    Indication(s) Hemodialysis  Consent Risks of the procedure as well as the alternatives and risks of each were explained to the patient and/or caregiver.  Consent for the procedure was obtained and is signed in the bedside chart  Anesthesia Topical only with 1% lidocaine   Timeout Verified patient identification, verified procedure, site/side was marked, verified correct patient position, special equipment/implants available, medications/allergies/relevant history reviewed, required imaging and test results available.  Sterile Technique Maximal sterile technique including full sterile barrier drape, hand hygiene, sterile gown, sterile gloves, mask, hair covering, sterile ultrasound probe cover (if used).  Procedure Description Area of catheter insertion was cleaned with chlorhexidine and draped in sterile fashion.   With real-time ultrasound guidance a HD catheter was placed into the right internal jugular vein.  Nonpulsatile blood flow and easy flushing noted in all ports.  The catheter was sutured in place and sterile dressing applied.  Complications/Tolerance None; patient tolerated the procedure well. Chest X-ray is ordered to verify placement for internal jugular or subclavian cannulation.  Chest x-ray is not ordered for femoral cannulation.  EBL Minimal  Specimen(s) None   [name], AGACNP-BC [place] for personal pager PCCM on call pager [phone]  01/10/2020 6:13 PM

## 2020-01-10 NOTE — Progress Notes (Signed)
HD terminated at this time with 1hr and 15 minutes left of tx. Clotted lines noted x2, with infiltration noted x2. Unable to specifically determine if it is soley clotted access versus solely blood viscosity and clotted extracorporeal circuit. Dr.Schertz to follow up with catheter placement and possible transfer to higher level of care.

## 2020-01-10 NOTE — Progress Notes (Signed)
Due to agitation pt infiltrated AVF, we restrained the patient and gave IV sedation and stuck his AVF 2 more times , but AVF is not usable at this time. He still needs dialysis.  Pt will need a new HD catheter and given his new delirium would recommend ICU care. Will consult CCM.    Kelly Splinter, MD 01/10/2020, 11:17 AM

## 2020-01-10 NOTE — Progress Notes (Signed)
   01/10/20 1227  Assess: MEWS Score  Temp 99.4 F (37.4 C)  BP (!) 148/63  Pulse Rate 100  Resp (!) 32  SpO2 98 %  Assess: MEWS Score  MEWS Temp 0  MEWS Systolic 0  MEWS Pulse 0  MEWS RR 2  MEWS LOC 1  MEWS Score 3  MEWS Score Color Yellow  Assess: if the MEWS score is Yellow or Red  Were vital signs taken at a resting state? Yes  Focused Assessment Change from prior assessment (see assessment flowsheet)  Early Detection of Sepsis Score *See Row Information* Medium  MEWS guidelines implemented *See Row Information* Yes  Take Vital Signs  Increase Vital Sign Frequency  Yellow: Q 2hr X 2 then Q 4hr X 2, if remains yellow, continue Q 4hrs  Escalate  MEWS: Escalate Yellow: discuss with charge nurse/RN and consider discussing with provider and RRT  Notify: Charge Nurse/RN  Name of Charge Nurse/RN Notified Dario [address]   Date Charge Nurse/RN Notified 01/10/20  Time Charge Nurse/RN Notified 61  Notify: Provider  Provider Name/Title MD @ bedside  Date Provider Notified 01/10/20  Notification Type Face-to-face  Notification Reason Change in status  Response See new orders  Notify: Rapid Response  Name of Rapid Response RN Notified Helle RN  Date Rapid Response Notified 01/10/20  Time Rapid Response Notified 2446  Document  Patient Outcome Transferred/level of care increased  Pt will transfer to [facility]

## 2020-01-10 NOTE — Progress Notes (Signed)
  NEUROSURGERY PROGRESS NOTE   Patient refusing medications overnight, but otherwise no acute issues Neck soreness continues to improve No new N/T/W   EXAM:  BP (!) 184/76 (BP Location: Right Arm)   Pulse 95   Temp 98.6 F (37 C) (Oral)   Resp 20   Ht 5' 11.5" (1.816 m)   Wt 92.2 kg   SpO2 100%   BMI 27.95 kg/m   Awake, alert, oriented  Speech fluent, appropriate  CN grossly intact  MAEW, grossly normal - motor stable Incision: dried blood on bandage, no active bleeding  IMPRESSION/PLAN 69 y.o. male POD#4 C4-T2 PCDF for spinal infection. Stable neurologically. - C-collar when upright and OOB - continue abx per ID - no new NS recs. Will sign off. Patient will need to follow up in clinic in 3 weeks at discharge. Please call for any concerns

## 2020-01-10 NOTE — Progress Notes (Signed)
Pt continues to be increasingly agitated and restless requesting to terminate tx at this time. Upon redirection by staff, pt yells "I'm getting out of here you can't hold me against my will!". Brooke Bonito made aware and again enforced the fact that patient needs to remain on dialysis in order to improve mentation and jerking. Ativan 0.5 mg given IV per Katie,PA.

## 2020-01-10 NOTE — Progress Notes (Signed)
eLink Physician-Brief Progress Note Patient Name: [name]. DOB: 11-Jan-1951 MRN: [id number]   Date of Service  01/10/2020  HPI/Events of Note  Multiple issues: 1. Hypotension - BP = 54/37 with MAP = 44 and 2. Altered LOC - Patient not responsive. Sat = 96% an RR = 27. Blood glucose = 145.   eICU Interventions  Plan: 1. 25% Albumin 25 gm IV now.  2. Phenylephrine IV infusion. Titrate to MAP >= 65. 3. Head CT Scan without contrast STAT.  4. ABG STAT. 5. Will ask ground team to evaluate the patient at bedside.      Intervention Category Major Interventions: Change in mental status - evaluation and management;Hypotension - evaluation and management  Lysle Dingwall 01/10/2020, 11:59 PM

## 2020-01-10 NOTE — Progress Notes (Signed)
PT Cancellation Note  Patient Details Name: [name]. MRN: [id number] DOB: 04-21-1951   Cancelled Treatment:    Reason Eval/Treat Not Completed: Patient not medically ready. Per RN, the pt is not doing well following HD today and is being transferred to the ICU. PT will continue to follow and attempt when pt is in a more stable condition.   Karma Ganja, PT, DPT   Acute Rehabilitation Department Pager #: [phone]   Otho Bellows 01/10/2020, 1:18 PM

## 2020-01-10 NOTE — Plan of Care (Signed)
  Problem: Activity: Goal: Risk for activity intolerance will decrease Outcome: Progressing   Problem: Pain Managment: Goal: General experience of comfort will improve Outcome: Progressing   

## 2020-01-10 NOTE — Progress Notes (Signed)
Pt in ICU now, spoke w/ Dr Halford Chessman, pt's AVF is not clotted but it is not usable at this time due to several infiltrations so we do need a new temp HD cath and HD will be completed in ICU once catheter is in.  Does not need CRRT at this time.   Kelly Splinter, MD 01/10/2020, 4:20 PM

## 2020-01-10 NOTE — Progress Notes (Signed)
[facility]  Date of Admission:  01/04/2020      Total days of antibiotics 6   Vancomycin            ASSESSMENT: Nieves Estill Llerena. is a 69 y.o. 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   Moved to ICU for increased WOB and hypoxia noted on ABG in dialysis. He was able to ask me questions on exam and make eye contact. Tenderness overlying chest with exam - ?[place] joint infection vs old catheter site tenderness.   Concern for possible CNS emboli contributing from suspected endocarditis - Would consider imaging of brain once he is stable. He has not had TEE yet given his prolonged need for IV antibiotics already but may be helpful   He is complaining of right shoulder pain with some difficulty of raising arm and pushing against gravity - once respiratory status more stable will need to consider evaluating for possible metastatic site of infection in joint vs surgery related pain.    PLAN: 1. Continue vancomycin  2. Follow fever patterns 3. Once stable will need eval of L [place] joint and R shoulder  4. Head imaging for CNS emboli eval once stable   Principal Problem:   MRSA bacteremia Active Problems:   TOBACCO ABUSE   OBSTRUCTIVE SLEEP APNEA   Colon cancer, status Post resection    HYPERTENSION   ESRD (end stage renal disease) (HCC)   Epidural abscess   Acute encephalopathy   Sepsis ([place])   Bacteremia associated with intravascular line (HCC)    (feeding supplement) PROSource Plus  30 mL Oral BID BM   amLODipine  10 mg Oral QHS   atorvastatin  20 mg Oral QHS   Chlorhexidine Gluconate Cloth  6 each Topical Q0600   Chlorhexidine Gluconate Cloth  6 each Topical Q0600   insulin aspart  0-6 Units Subcutaneous Q4H   insulin detemir  20 Units Subcutaneous QHS   metoprolol tartrate  50 mg Oral BID   multivitamin  1 tablet Oral QHS    SUBJECTIVE: Patient obtunded / confused.   Interval  events noted for increased respiratory distress with PO2 on ABG < 31. Moved to ICU. PCCM consultation.   L AVG  Review of Systems: Review of Systems  Unable to perform ROS: Mental acuity     No Known Allergies  OBJECTIVE: Vitals:   01/10/20 1258 01/10/20 1332 01/10/20 1353 01/10/20 1432  BP: (!) 156/69 (!) 148/84 (!) 199/78 (!) 180/75  Pulse: (!) 102 (!) 101 (!) 102   Resp: (!) 24 (!) 36    Temp:   (!) 97.3 F (36.3 C)   TempSrc:   Oral   SpO2: 97% 99%    Weight:      Height:       Body mass index is 27.53 kg/m.  Physical Exam Neck:     Comments: C-spine incision not visualized. Cardiovascular:     Rate and Rhythm: Regular rhythm. Tachycardia present.     Heart sounds: No murmur heard.   Pulmonary:     Breath sounds: Normal breath sounds.     Comments: Increased RR, accessory usage, loud upper airway breathing.  Chest:       Comments: Painful with auscultation as marked.  Abdominal:     General: There is no distension.     Palpations: Abdomen is soft.  Skin:    General: Skin is warm and  dry.  Neurological:     Mental Status: He is alert.     Lab Results Lab Results  Component Value Date   WBC 19.9 (H) 01/10/2020   HGB 9.3 (L) 01/10/2020   HCT 26.7 (L) 01/10/2020   MCV 76.3 (L) 01/10/2020   PLT 255 01/10/2020    Lab Results  Component Value Date   CREATININE 12.16 (H) 01/10/2020   BUN 119 (H) 01/10/2020   NA 131 (L) 01/10/2020   K 4.5 01/10/2020   CL 91 (L) 01/10/2020   CO2 21 (L) 01/10/2020    Lab Results  Component Value Date   ALT 20 01/04/2020   AST 21 01/04/2020   ALKPHOS 84 01/04/2020   BILITOT 1.1 01/04/2020     Microbiology: Recent Results (from the past 240 hour(s))  Culture, blood (routine x 2)     Status: Abnormal   Collection Time: 01/04/20 12:13 PM   Specimen: BLOOD RIGHT HAND  Result Value Ref Range Status   Specimen Description   Final    BLOOD RIGHT HAND Performed at [facility], [place] [address] [place], [place] [address]    Special Requests   Final    BOTTLES DRAWN AEROBIC AND ANAEROBIC Blood Culture adequate volume Performed at [facility] [address], [place], [place] [address]    Culture  Setup Time   Final    GRAM POSITIVE COCCI IN BOTH AEROBIC AND ANAEROBIC BOTTLES CRITICAL VALUE NOTED.  VALUE IS CONSISTENT WITH PREVIOUSLY REPORTED AND CALLED VALUE.    Culture (A)  Final    STAPHYLOCOCCUS AUREUS SUSCEPTIBILITIES PERFORMED ON PREVIOUS CULTURE WITHIN THE LAST 5 DAYS. Performed at [facility], [place] [address], [place], [place] [address]    Report Status 01/07/2020 FINAL  Final  Culture, blood (routine x 2)     Status: Abnormal   Collection Time: 01/04/20 12:13 PM   Specimen: Site Not Specified; Blood  Result Value Ref Range Status   Specimen Description   Final    SITE NOT SPECIFIED Performed at [facility] [address], [place], [place] [address]    Special Requests   Final    BOTTLES DRAWN AEROBIC AND ANAEROBIC Blood Culture adequate volume Performed at [facility] [address], [place], [place] [address]    Culture  Setup Time   Final    GRAM POSITIVE COCCI IN BOTH AEROBIC AND ANAEROBIC BOTTLES Organism ID to follow CRITICAL RESULT CALLED TO, READ BACK BY AND VERIFIED WITH: D. WOFFORD PHARMD, AT 0745 01/05/20 BY Rush Landmark Performed at [facility], [place] [address], [place], [place] [address]    Culture METHICILLIN RESISTANT STAPHYLOCOCCUS AUREUS (A)  Final   Report Status 01/07/2020 FINAL  Final   Organism ID, Bacteria METHICILLIN RESISTANT STAPHYLOCOCCUS AUREUS  Final      Susceptibility   Methicillin resistant staphylococcus aureus - MIC*    CIPROFLOXACIN >=8 RESISTANT Resistant     ERYTHROMYCIN >=8 RESISTANT Resistant     GENTAMICIN <=0.5 SENSITIVE Sensitive     OXACILLIN >=4 RESISTANT Resistant     TETRACYCLINE <=1 SENSITIVE Sensitive     VANCOMYCIN <=0.5 SENSITIVE  Sensitive     TRIMETH/SULFA <=10 SENSITIVE Sensitive     CLINDAMYCIN <=0.25 SENSITIVE Sensitive     RIFAMPIN <=0.5 SENSITIVE Sensitive     Inducible Clindamycin NEGATIVE Sensitive     * METHICILLIN RESISTANT STAPHYLOCOCCUS AUREUS  Blood Culture ID Panel (Reflexed)  Status: Abnormal   Collection Time: 01/04/20 12:13 PM  Result Value Ref Range Status   Enterococcus faecalis NOT DETECTED NOT DETECTED Final   Enterococcus Faecium NOT DETECTED NOT DETECTED Final   Listeria monocytogenes NOT DETECTED NOT DETECTED Final   Staphylococcus species DETECTED (A) NOT DETECTED Final    Comment: CRITICAL RESULT CALLED TO, READ BACK BY AND VERIFIED WITH: D. WOFFORD PHARMD, AT 0745 01/05/20 BY D. VANHOOK    Staphylococcus aureus (BCID) DETECTED (A) NOT DETECTED Final    Comment: Methicillin (oxacillin)-resistant Staphylococcus aureus (MRSA). MRSA is predictably resistant to beta-lactam antibiotics (except ceftaroline). Preferred therapy is vancomycin unless clinically contraindicated. Patient requires contact precautions if  hospitalized. CRITICAL RESULT CALLED TO, READ BACK BY AND VERIFIED WITH: D. WOFFORD PHARMD, AT 0745 01/05/20 BY D. VANHOOK    Staphylococcus epidermidis NOT DETECTED NOT DETECTED Final   Staphylococcus lugdunensis NOT DETECTED NOT DETECTED Final   Streptococcus species NOT DETECTED NOT DETECTED Final   Streptococcus agalactiae NOT DETECTED NOT DETECTED Final   Streptococcus pneumoniae NOT DETECTED NOT DETECTED Final   Streptococcus pyogenes NOT DETECTED NOT DETECTED Final   A.calcoaceticus-baumannii NOT DETECTED NOT DETECTED Final   Bacteroides fragilis NOT DETECTED NOT DETECTED Final   Enterobacterales NOT DETECTED NOT DETECTED Final   Enterobacter cloacae complex NOT DETECTED NOT DETECTED Final   Escherichia coli NOT DETECTED NOT DETECTED Final   Klebsiella aerogenes NOT DETECTED NOT DETECTED Final   Klebsiella oxytoca NOT DETECTED NOT DETECTED Final   Klebsiella pneumoniae  NOT DETECTED NOT DETECTED Final   Proteus species NOT DETECTED NOT DETECTED Final   Salmonella species NOT DETECTED NOT DETECTED Final   Serratia marcescens NOT DETECTED NOT DETECTED Final   Haemophilus influenzae NOT DETECTED NOT DETECTED Final   Neisseria meningitidis NOT DETECTED NOT DETECTED Final   Pseudomonas aeruginosa NOT DETECTED NOT DETECTED Final   Stenotrophomonas maltophilia NOT DETECTED NOT DETECTED Final   Candida albicans NOT DETECTED NOT DETECTED Final   Candida auris NOT DETECTED NOT DETECTED Final   Candida glabrata NOT DETECTED NOT DETECTED Final   Candida krusei NOT DETECTED NOT DETECTED Final   Candida parapsilosis NOT DETECTED NOT DETECTED Final   Candida tropicalis NOT DETECTED NOT DETECTED Final   Cryptococcus neoformans/gattii NOT DETECTED NOT DETECTED Final   Meth resistant mecA/C and MREJ DETECTED (A) NOT DETECTED Final    Comment: CRITICAL RESULT CALLED TO, READ BACK BY AND VERIFIED WITH: D. WOFFORD PHARMD, AT 0745 01/05/20 BY Rush Landmark Performed at [facility], [address] [place], [place] [address]   SARS Coronavirus 2 by RT PCR (hospital order, performed in [facility] hospital lab) Nasopharyngeal Nasopharyngeal Swab     Status: None   Collection Time: 01/04/20  2:45 PM   Specimen: Nasopharyngeal Swab  Result Value Ref Range Status   SARS Coronavirus 2 NEGATIVE NEGATIVE Final    Comment: (NOTE) SARS-CoV-2 target nucleic acids are NOT DETECTED.  The SARS-CoV-2 RNA is generally detectable in upper and lower respiratory specimens during the acute phase of infection. The lowest concentration of SARS-CoV-2 viral copies this assay can detect is 250 copies / mL. A negative result does not preclude SARS-CoV-2 infection and should not be used as the sole basis for treatment or other patient management decisions.  A negative result may occur with improper specimen collection / handling, submission of specimen other than nasopharyngeal swab, presence  of viral mutation(s) within the areas targeted by this assay, and inadequate number of viral copies (<250 copies /  mL). A negative result must be combined with clinical observations, patient history, and epidemiological information.  Fact Sheet for Patients:   StrictlyIdeas.no  Fact Sheet for Healthcare Providers: BankingDealers.co.za  This test is not yet approved or  cleared by the [place] FDA and has been authorized for detection and/or diagnosis of SARS-CoV-2 by FDA under an Emergency Use Authorization (EUA).  This EUA will remain in effect (meaning this test can be used) for the duration of the COVID-19 declaration under Section 564(b)(1) of the Act, 21 U.S.C. section 360bbb-3(b)(1), unless the authorization is terminated or revoked sooner.  Performed at [facility], [place] [address] [place], [place] [address]   Urine culture     Status: None   Collection Time: 01/04/20  9:20 PM   Specimen: Urine, Random  Result Value Ref Range Status   Specimen Description   Final    URINE, RANDOM Performed at [facility] [address] [place], [place] [address]    Special Requests   Final    NONE Performed at [facility], [place] [address] [place], [place] [address]    Culture   Final    NO GROWTH Performed at [facility], [place] [address] [place], [place] [address]    Report Status 01/05/2020 FINAL  Final  Aerobic/Anaerobic Culture (surgical/deep wound)     Status: None (Preliminary result)   Collection Time: 01/06/20  2:52 PM   Specimen: Soft Tissue, Other  Result Value Ref Range Status   Specimen Description WOUND  Final   Special Requests POSTOP CERVICAL LAMINECTOMY AND FUSION SITE  Final   Gram Stain   Final    NO WBC SEEN ABUNDANT GRAM POSITIVE COCCI Performed at [facility], [place] [address], [place], [place] [address]    Culture   Final     MODERATE METHICILLIN RESISTANT STAPHYLOCOCCUS AUREUS NO ANAEROBES ISOLATED; CULTURE IN PROGRESS FOR 5 DAYS    Report Status PENDING  Incomplete   Organism ID, Bacteria METHICILLIN RESISTANT STAPHYLOCOCCUS AUREUS  Final      Susceptibility   Methicillin resistant staphylococcus aureus - MIC*    CIPROFLOXACIN >=8 RESISTANT Resistant     ERYTHROMYCIN >=8 RESISTANT Resistant     GENTAMICIN <=0.5 SENSITIVE Sensitive     OXACILLIN >=4 RESISTANT Resistant     TETRACYCLINE <=1 SENSITIVE Sensitive     VANCOMYCIN 1 SENSITIVE Sensitive     TRIMETH/SULFA <=10 SENSITIVE Sensitive     CLINDAMYCIN <=0.25 SENSITIVE Sensitive     RIFAMPIN <=0.5 SENSITIVE Sensitive     Inducible Clindamycin NEGATIVE Sensitive     * MODERATE METHICILLIN RESISTANT STAPHYLOCOCCUS AUREUS  Culture, blood (routine x 2)     Status: None (Preliminary result)   Collection Time: 01/07/20  7:10 AM   Specimen: BLOOD  Result Value Ref Range Status   Specimen Description BLOOD RIGHT ANTECUBITAL  Final   Special Requests   Final    BOTTLES DRAWN AEROBIC ONLY Blood Culture results may not be optimal due to an inadequate volume of blood received in culture bottles   Culture   Final    NO GROWTH 3 DAYS Performed at [facility], [address], [place], [place] [address]    Report Status PENDING  Incomplete  Culture, blood (routine x 2)     Status: None (Preliminary result)   Collection Time: 01/07/20  7:21 AM   Specimen: BLOOD RIGHT HAND  Result Value Ref Range Status  Specimen Description BLOOD RIGHT HAND  Final   Special Requests   Final    BOTTLES DRAWN AEROBIC AND ANAEROBIC Blood Culture results may not be optimal due to an excessive volume of blood received in culture bottles   Culture   Final    NO GROWTH 3 DAYS Performed at [facility], [place] [address] [place], [place] [address]    Report Status PENDING  Incomplete  Culture, blood (Routine X 2) w Reflex to ID Panel     Status: None (Preliminary result)    Collection Time: 01/07/20  7:15 PM   Specimen: BLOOD  Result Value Ref Range Status   Specimen Description BLOOD BLOOD RIGHT FOREARM  Final   Special Requests   Final    BOTTLES DRAWN AEROBIC AND ANAEROBIC Blood Culture adequate volume   Culture   Final    NO GROWTH 3 DAYS Performed at [facility], [place] [address], [place], [place] [address]    Report Status PENDING  Incomplete  Culture, blood (Routine X 2) w Reflex to ID Panel     Status: None (Preliminary result)   Collection Time: 01/07/20  7:26 PM   Specimen: BLOOD RIGHT HAND  Result Value Ref Range Status   Specimen Description BLOOD RIGHT HAND  Final   Special Requests   Final    BOTTLES DRAWN AEROBIC AND ANAEROBIC Blood Culture adequate volume   Culture   Final    NO GROWTH 3 DAYS Performed at [facility], [place] [address], [place], [place] [address]    Report Status PENDING  Incomplete     [name], MSN, NP-[facility] [email or web address] Pager: [phone] Office: [phone] [place]: [phone]

## 2020-01-10 NOTE — Progress Notes (Signed)
CBG did not show up when docked for 0400.CBG was 208 per NT Kristi according to log on meter. Pt refused insulin.   Eleanora Neighbor, RN

## 2020-01-10 NOTE — Progress Notes (Signed)
Medical restraints applied at this time per Dr. Jonnie Finner as minimal effects of ativan at this time. Pt requiring constant and one to one observation due to restless and afitated behaviors, pulling at blood lines when awakened. Fistula recannulated at this time, will continue to monitor.

## 2020-01-10 NOTE — Consult Note (Addendum)
NAME:  Rodney Mo., MRN:  [id number], DOB:  28-Dec-1950, LOS: 6 ADMISSION DATE:  01/04/2020, CONSULTATION DATE:  01/10/20 REFERRING MD:  Dr Jonnie Finner, CHIEF COMPLAINT:  AMS   Brief History   69yo with ho esrd presented on 8/3 with ams found to have large cervicothoracic dorsal cervical epidural abscess underwent laminectomy and drainage with improved mental status however now acutely altered.   History of present illness   69 yo male with pmh HTn, Hyperlipidemia, osa, dm2, esrd via L arm fistula who presented to [facility] with ams and back/neck pain. He had been the the ed 3 times with similar complains recently, but this presentation noted to have elevated wbc and diffuse weakness. MRI of c-spine revealed an abscess and neurosx consulted. On 8/5 pt underwent cervicothoracic laminectomy and fusion without complications, intraop cx with MRSA. Neurosx as since signed off.   He was also noted to be bacteremic with MRSA, for which ID has been consulted and he remains on vancomycin. Subsequent blood cx have been negative and tte without overt regurgitation or vegetations.   Per chart review pt's mental status was improving. Bedside RN at this time states that prior to transfer to HD for dialysis pt was alert and communicative. CCM has been consulted for ams and need for dialysis access (fistula unable to be utilized at this time).   At time of my exam pt is garbled speech, moving all 4, sonorous respirations but coughing and arousable and clears throat. He is not able to communicate or stay awake long enough to follow commands.   Past Medical History   Past Medical History:  Diagnosis Date  . Anemia   . Arthritis    left knee  . Chronic kidney disease    acute renal failure/injury requiring short-term HD 2013  . Colon cancer ([place])   . Colon polyp    11/2011 - Polyps identified, biopsy - invasive adenocarinoma  . DM II (diabetes mellitus, type II), controlled ([place])    type 2 IDDM x 15 years. A1C  1/09 13.7.   Marland Kitchen 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 12/25/2011  . GERD (gastroesophageal reflux disease)   . Heart murmur   . 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 Hyperlipidemia   . Hypertension    for a few years and resolved in 2013/2014.  . Sleep apnea    does not wear CPAP now  . Wears glasses     Significant Hospital Events   Cervical and thoracic Laminectomy 8/5   Consults:  neurosurgery Nephrology Infectious disease ccm  Procedures:  Cervical and thoracic laminectomy 8/5  Significant Diagnostic Tests:  8/3 MRI c spine: 1. Abnormal fluid in the dorsal spinal canal extending from foramen magnum to the T2 level. Given the other inflammatory changes and concern, this most likely represents epidural infection. There is some fluid ventral to the spinal cord at C2. 2. Diffuse prevertebral edema from the clivus to C6 is also concerning for infection. 3. The cord is compressed without abnormal signal suggesting edema. 4. Mild edema in the posterior paraspinous muscles, left greater than right. Edema is most significant within the paraspinous muscles just below the occiput. No focal abscess is present. 5. Severe disc disease at C5-6 and C6-7 with moderate central canal stenosis at both levels. 6. Mild disc disease at C3-4 and C4-5 with some cord compression at these levels. 7. Abnormal marrow signal in the lower cervical spine, particularly C7 and T1. This is nonspecific and can be seen  in the setting of anemia, smoking, or obesity. 8/4 MRI thoracic: 1. Dorsal epidural fluid collection extending from the cervical spine to T2 level, consistent with epidural abscess. No continuation below the T2 level. 2. No thoracic spinal canal or neural foraminal stenosis. 8/5 MRI lumbar: 1. There is what appears to be a subdural fluid collection at the posterior left quadrant extending from T12-L1 to L2-3, with maximal diameter on the axial images 7 x 11 mm in cross-sectional  diameter. This causes mass effect upon the neural structures, crowding them anteriorly. Additionally, there is probably some epidural material posteriorly at L3 to L4. This could be a small subdural component of this as well. This could be infected material. 2. 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 There is 2 mm of anterolisthesis at L2-3, L3-4 and L4-5. There is disc degeneration with bulging of the discs. There is moderate to severe multifactorial spinal stenosis at L3-4 and severe spinal stenosis at L4-5 that could cause neural compression. Facet joint edema at L2-3, L3-4 and L4-5 could be degenerative or potentially relate to infection. The single most suspicious looking facet joint is on the right at L3-4. Additionally, the posterior para spinous soft tissue structures show edematous change which could be inflammatory. 8/6 echo: LVEF 10%, grade 2 diastolic dysfunction, trival MR and TR  Micro Data:  8/3 blood: MRSA 8/3 urine: neg 8/5 intraop: MRSA 8/6 blood: negative   Antimicrobials:  vanc 8/4-> (8 weeks parenteral vanc +/- rifampin)  Interim history/subjective:  8/9: consulted in HD for ams and need for line. Pt is certainly confused. Not following commands, this was a change per RN at bedside  Objective   Blood pressure (!) 177/80, pulse 92, temperature 97.8 F (36.6 C), temperature source Oral, resp. rate (!) 26, height 5' 11.5" (1.816 m), weight 200 lb 2.8 oz (90.8 kg), SpO2 97 %.        Intake/Output Summary (Last 24 hours) at 01/10/2020 1212 Last data filed at 01/10/2020 1130 Gross per 24 hour  Intake 840 ml  Output 1234 ml  Net -394 ml   Filed Weights   01/09/20 2007 01/10/20 0500 01/10/20 0742  Weight: 203 lb 4.2 oz (92.2 kg) 203 lb 4.2 oz (92.2 kg) 200 lb 2.8 oz (90.8 kg)    Examination: General: poorly responsive, in mild distress HEENT: NCAT, EOMI, PERRLA, MMMP Lungs: CTA, no wheezes, rhonchi, rales,  sonorous respirations with some abdominal muscle use  Cardiovascular: RRR, no m/g/r Abdomen: soft, NT,ND, BS+ Extremities: + edema Skin: no rashes, warm and dry Neuro: moves all 4 extremities without focal deficits but not to command GU: deferred  Resolved Hospital Problem list   Epidural abscess  Assessment & Plan:  Acute infectious encephalopathy:  -monitor neuro status -would benefit from [facility] but needs to be able to comply and would req intubation for that.  -cont abx.  -f/u cx -do not think that uremia on 100 in a esrd it playing a role but perhaps.  -avoid sedating meds.  -while repeat blood cx are negative and tte without overt vegetations, without brain imaging cannot r/o embolic phenomenon contributing (although is moving all 4) -check hepatic function panel and ammonia -?  tegretol withdraw contributing.   Sepsis 2/2 MRSA epidural abscess, s/p surgical intervention, POA MRSA bacteremia, POA -temp hd catheter has been removed -unable to efficiently utilize L AVF and will require replacement -blood cx from 8/6 are still negative so ok for placement.   Post op C4-T2  laminectomy:  -per neurosx -c collar when upright and oob -they have signed off -will need f/u in 3 weeks at d/c with neurosx -cont abx per ID  HFpEF without acute decompensation HTN Hyperlipidemia  t2dm with hyperglycemia:  -check a1c -ssi and gluc monitoring  Anemia of chronic disease:  -no acute indication for transfusion -follow  ESRD on HD:  -will need new dialysis access  Best practice:  Diet: npo  Pain/Anxiety/Delirium protocol (if indicated): avoid VAP protocol (if indicated): n/a DVT prophylaxis: scd GI prophylaxis: n/a Glucose control: ssi and levemir Mobility: bedrest Code Status: full Family Communication: pending Disposition: ICU  Labs   CBC: Recent Labs  Lab 01/04/20 1213 01/05/20 0502 01/06/20 0258 01/06/20 1542 01/06/20 1651 01/07/20 0720 01/08/20 0210  01/09/20 0451 01/10/20 0230  WBC 25.3*   < > 25.8*  --   --  16.8* 15.4* 18.8* 19.9*  NEUTROABS 22.5*  --   --   --   --   --   --   --   --   HGB 10.0*   < > 8.7*   < > 9.2* 8.7* 9.6* 10.0* 9.3*  HCT 28.6*   < > 24.9*   < > 27.0* 25.0* 27.4* 28.7* 26.7*  MCV 75.5*   < > 73.5*  --   --  74.4* 74.5* 76.1* 76.3*  PLT 333   < > 290  --   --  223 232 270 255   < > = values in this interval not displayed.    Basic Metabolic Panel: Recent Labs  Lab 01/06/20 0258 01/06/20 1542 01/06/20 1651 01/07/20 0720 01/08/20 0210 01/09/20 0307 01/10/20 0230  NA 137   < > 136 130* 135 135 131*  K 4.2   < > 6.0* 5.3* 4.0 5.6* 4.5  CL 96*  --   --  93* 94* 94* 91*  CO2 20*  --   --  16* 22 20* 21*  GLUCOSE 144*  --   --  353* 245* 179* 222*  BUN 86*  --   --  130* 75* 102* 119*  CREATININE 12.57*  --   --  14.21* 9.11* 10.85* 12.16*  CALCIUM 8.3*  --   --  8.4* 8.2* 8.0* 7.8*  MG 2.0  --   --  2.3 2.1 2.3 2.3  PHOS  --   --   --   --   --  8.6* 9.6*   < > = values in this interval not displayed.   GFR: Estimated Creatinine Clearance: 6.3 mL/min (A) (by C-G formula based on SCr of 12.16 mg/dL (H)). Recent Labs  Lab 01/04/20 1400 01/05/20 0502 01/07/20 0720 01/08/20 0210 01/09/20 0451 01/10/20 0230  WBC  --    < > 16.8* 15.4* 18.8* 19.9*  LATICACIDVEN 1.7  --   --   --   --   --    < > = values in this interval not displayed.    Liver Function Tests: Recent Labs  Lab 01/04/20 1213 01/09/20 0307 01/10/20 0230  AST 21  --   --   ALT 20  --   --   ALKPHOS 84  --   --   BILITOT 1.1  --   --   PROT 9.1*  --   --   ALBUMIN 3.2* 2.0* 2.1*   No results for input(s): LIPASE, AMYLASE in the last 168 hours. No results for input(s): AMMONIA in the last 168 hours.  ABG  Component Value Date/Time   PHART 7.370 01/06/2020 1651   PCO2ART 38.0 01/06/2020 1651   PO2ART 182 (H) 01/06/2020 1651   HCO3 22.0 01/06/2020 1651   TCO2 23 01/06/2020 1651   ACIDBASEDEF 3.0 (H) 01/06/2020 1651    O2SAT 100.0 01/06/2020 1651     Coagulation Profile: Recent Labs  Lab 01/04/20 1213  INR 1.3*    Cardiac Enzymes: No results for input(s): CKTOTAL, CKMB, CKMBINDEX, TROPONINI in the last 168 hours.  HbA1C: Hemoglobin A1C  Date/Time Value Ref Range Status  12/08/2013 09:21 AM 9.6  Final  09/15/2013 11:00 AM >14.0  Final   Hgb A1c MFr Bld  Date/Time Value Ref Range Status  03/02/2012 04:38 PM 6.2 (H) <5.7 % Final    Comment:    (NOTE)                                                                       According to the ADA Clinical Practice Recommendations for 2011, when HbA1c is used as a screening test:  >=6.5%   Diagnostic of Diabetes Mellitus           (if abnormal result is confirmed) 5.7-6.4%   Increased risk of developing Diabetes Mellitus References:Diagnosis and Classification of Diabetes Mellitus,Diabetes TKZS,0109,32(TFTDD 1):S62-S69 and Standards of Medical Care in         Diabetes - 2011,Diabetes UKGU,5427,06 (Suppl 1):S11-S61.  02/06/2012 03:46 PM 5.9 (H) <5.7 % Final    Comment:    (NOTE)                                                                       According to the ADA Clinical Practice Recommendations for 2011, when HbA1c is used as a screening test:  >=6.5%   Diagnostic of Diabetes Mellitus           (if abnormal result is confirmed) 5.7-6.4%   Increased risk of developing Diabetes Mellitus References:Diagnosis and Classification of Diabetes Mellitus,Diabetes CBJS,2831,51(VOHYW 1):S62-S69 and Standards of Medical Care in         Diabetes - 2011,Diabetes Care,2011,34 (Suppl 1):S11-S61.    CBG: Recent Labs  Lab 01/09/20 1656 01/09/20 2004 01/10/20 0006 01/10/20 0413 01/10/20 0723  GLUCAP 191* 180* 183* 208* 190*    Review of Systems:   Unobtainable 2/2 pt's mental status  Past Medical History  He,  has a past medical history of Anemia, Arthritis, Chronic kidney disease, Colon cancer ([place]), Colon polyp, DM II (diabetes mellitus,  type II), controlled ([place]), 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 (12/25/2011), GERD (gastroesophageal reflux disease), Heart murmur, ileostomy (10/13/2012), Hyperlipidemia, Hypertension, Sleep apnea, and Wears glasses.   Surgical History    Past Surgical History:  Procedure Laterality Date  . APPLICATION OF WOUND VAC  02/16/2012   Procedure: APPLICATION OF WOUND VAC;  Surgeon: Zenovia Jarred, MD;  Location: [place];  Service: General;  Laterality: N/A;  . APPLICATION OF WOUND VAC  02/26/2012   Procedure: APPLICATION OF WOUND VAC;  Surgeon:  Imogene Burn. [name], MD;  Location: [place];  Service: General;  Laterality: N/A;  . AV FISTULA PLACEMENT Left 07/29/2019   Procedure: ARTERIOVENOUS (AV) FISTULA CREATION;  Surgeon: Marty Heck, MD;  Location: [place];  Service: Vascular;  Laterality: Left;  . COLONOSCOPY    . COLONOSCOPY  02/06/2012   Procedure: COLONOSCOPY;  Surgeon: Milus Banister, MD;  Location: [place];  Service: Endoscopy;;  . COLONOSCOPY WITH PROPOFOL N/A 06/10/2019   Procedure: COLONOSCOPY WITH PROPOFOL;  Surgeon: Milus Banister, MD;  Location: [facility];  Service: Endoscopy;  Laterality: N/A;  . COLOSTOMY REVERSAL    . DRESSING CHANGE UNDER ANESTHESIA  02/18/2012   Procedure: DRESSING CHANGE UNDER ANESTHESIA;  Surgeon: Odis Hollingshead, MD;  Location: [place];  Service: General;  Laterality: N/A;  . ESOPHAGOGASTRODUODENOSCOPY (EGD) WITH PROPOFOL N/A 06/10/2019   Procedure: ESOPHAGOGASTRODUODENOSCOPY (EGD) WITH PROPOFOL;  Surgeon: Milus Banister, MD;  Location: [facility];  Service: Endoscopy;  Laterality: N/A;  . GANGLION CYST EXCISION  20 YRS AGO   RT ARM  . ILEOSTOMY  02/16/2012   Procedure: ILEOSTOMY;  Surgeon: Zenovia Jarred, MD;  Location: [place];  Service: General;  Laterality: N/A;  . ILEOSTOMY CLOSURE N/A 09/02/2012   Procedure: ILEOSTOMY REVERSAL;  Surgeon: Imogene Burn. [name], MD;  Location: [place];  Service: General;  Laterality: N/A;  . ILIOSTOMY    . INCISION  AND DRAINAGE OF WOUND  02/23/2012   Procedure: IRRIGATION AND DEBRIDEMENT WOUND;  Surgeon: Joyice Faster. Cornett, MD;  Location: [place];  Service: General;  Laterality: N/A;  . IR REMOVAL TUN CV CATH W/O FL  01/07/2020  . LAPAROTOMY  02/16/2012   Procedure: EXPLORATORY LAPAROTOMY;  Surgeon: Zenovia Jarred, MD;  Location: [place];  Service: General;  Laterality: N/A;  Exploratory Laparotomy with resection of anastomosis  . LAPAROTOMY  02/18/2012   Procedure: EXPLORATORY LAPAROTOMY;  Surgeon: Odis Hollingshead, MD;  Location: [place];  Service: General;  Laterality: N/A;  exploratory laparotomy  change of abdominal vac dressing  . LAPAROTOMY  02/20/2012   Procedure: EXPLORATORY LAPAROTOMY;  Surgeon: Odis Hollingshead, MD;  Location: [place];  Service: General;  Laterality: N/A;  . LAPAROTOMY  02/23/2012   Procedure: EXPLORATORY LAPAROTOMY;  Surgeon: Joyice Faster. Cornett, MD;  Location: [place];  Service: General;  Laterality: N/A;  Irrigation and Debridement of abdominal wound with wound vac change with partial closure  . LAPAROTOMY  02/26/2012   Procedure: EXPLORATORY LAPAROTOMY;  Surgeon: Imogene Burn. [name], MD;  Location: [place];  Service: General;  Laterality: N/A;     . PARTIAL COLECTOMY  02/06/2012  . PARTIAL COLECTOMY  02/06/2012   Procedure: PARTIAL COLECTOMY;  Surgeon: Imogene Burn. [name], MD;  Location: [place];  Service: General;  Laterality: N/A;  right partial colectomy  . POSTERIOR CERVICAL FUSION/FORAMINOTOMY N/A 01/06/2020   Procedure: CERVICAL FOUR- THORACIC TWO LAMINECTOMY AND FUSION;  Surgeon: Consuella Lose, MD;  Location: [place];  Service: Neurosurgery;  Laterality: N/A;  CERVICAL FOUR- THORACIC TWO LAMINECTOMY AND FUSION  . RESECTION SMALL BOWEL / CLOSURE ILEOSTOMY  402/2014   Dr [name]  . VACUUM ASSISTED CLOSURE CHANGE  02/20/2012   Procedure: ABDOMINAL VACUUM ASSISTED CLOSURE CHANGE;  Surgeon: Odis Hollingshead, MD;  Location: [place];  Service: General;;  . VACUUM ASSISTED CLOSURE CHANGE  02/23/2012    Procedure: ABDOMINAL VACUUM ASSISTED CLOSURE CHANGE;  Surgeon: Joyice Faster. Cornett, MD;  Location: [place];  Service: General;  Laterality: N/A;  Social History   reports that he has been smoking cigarettes. He has a 15.00 pack-year smoking history. He has never used smokeless tobacco. He reports current alcohol use. He reports current drug use. Drug: Cocaine.   Family History   His family history includes Diabetes in his father and sister; Heart disease in his father and sister; Hypertension in his mother. There is no history of Colon cancer.   Allergies No Known Allergies   Home Medications  Prior to Admission medications   Medication Sig Start Date End Date Taking? Authorizing Provider  amLODipine (NORVASC) 10 MG tablet Take 10 mg by mouth at bedtime.    Yes [provider]  atorvastatin (LIPITOR) 20 MG tablet Take 20 mg by mouth at bedtime.    Yes [provider]  betamethasone dipropionate 0.05 % cream Apply 1 application topically 2 (two) times daily as needed (skin irritation; psoriasis).   Yes [provider]  insulin detemir (LEVEMIR) 100 UNIT/ML injection Inject 0.24 mLs (24 Units total) into the skin at bedtime. Patient taking differently: Inject 26 Units into the skin See admin instructions. Per sliding scale: Most is 22 units and least is 12 units. 12/08/13  Yes Boggala, Gaynelle Adu, MD  lidocaine-prilocaine (EMLA) cream Apply 1 application topically once. Apply to needle site 1 hr. Prior to cannulation for hemodialysis.   Yes [provider]  metoprolol tartrate (LOPRESSOR) 50 MG tablet Take 50 mg by mouth 2 (two) times daily.    Yes [provider]  multivitamin (RENA-VIT) TABS tablet Take 1 tablet by mouth daily.   Yes [provider]  carbamazepine (TEGRETOL) 200 MG tablet Take 1 tablet (200 mg total) by mouth daily. Patient not taking: Reported on 01/04/2020 12/31/19 01/30/20  Rodell Perna A, PA-C  HYDROcodone-acetaminophen  (NORCO/VICODIN) 5-325 MG tablet Take 1 tablet by mouth every 4 (four) hours as needed for moderate pain. Patient not taking: Reported on 12/31/2019 07/29/19 07/28/20  Barbie Banner, PA-C     Critical care time: The patient is critically ill with multiple organ systems failure and requires high complexity decision making for assessment and support, frequent evaluation and titration of therapies, application of advanced monitoring technologies and extensive interpretation of multiple databases.  Critical care time 48 mins. This represents my time independent of the NP's/PA's/med students/residents time taking care of the pt. This is excluding procedures.     Audria Nine DO [facility] 01/10/2020, 12:12 PM

## 2020-01-10 NOTE — Progress Notes (Signed)
Patient resting deeply in brief intervals, however awakens frequently and again becomes agitated, occluding blood lines. Pt states "I can't stay here I have to go pay my bills!". Pt reoriented to situation and medical necessity that he receive dialysis, pt becoming agitated with this writer and attempts to sit up while on machine. HOB elevated for patient comfort. Dr. Jonnie Finner on unit to assess pt condition and another dose of ativan 0.5mg  given at this time. Pt noted with infiltration of blood line at fistula site at this time. Ice applied.

## 2020-01-10 NOTE — Progress Notes (Signed)
PROGRESS NOTE    [name].  XAJ:[id number] DOB: Dec 01, 1950 DOA: 01/04/2020 PCP: [facility]   Brief Narrative: [name]. is a 69 y.o. malewithhistory of ESRD on hemodialysis on Monday Wednesday Friday, hypertension, diabetes mellitus type 2, anemia history of colon cancer status post resection. Patient presented secondary to worsening headache and back pain and found to have an epidural abscess with associated cord compression in addition to MRSA positive bacteremia. Vancomycin started and neurosurgery consulted for management.   Assessment & Plan:   Principal Problem:   MRSA bacteremia Active Problems:   TOBACCO ABUSE   OBSTRUCTIVE SLEEP APNEA   Colon cancer, status Post resection    HYPERTENSION   ESRD (end stage renal disease) ([place])   Epidural abscess   Acute encephalopathy   Sepsis ([place])   Bacteremia associated with intravascular line ([place])   Sepsis Present on admission and secondary to bacteremia and epidural abscess. Associated leukocytosis which is currently stable. Afebrile. Patient started empirically on Vancomycin IV.  Cervical cord compression Epidural abscess Upper/lower extremity weakness Neurosurgery consulted on admission and patient is s/p  C4-T2 laminectomy and fusion for decompression on 8/5. MRSA in wound culture -Neurosurgery recommendations: Neck collar while upright -Antibiotics below -PT/OT recommending SNF  Acute metabolic encephalopathy vs delirium In setting of ESRD in addition to infection. ABG unremarkable. Preventing ability to complete HD. Discussed with nephrology with plan to transfer to ICU. Possibly related to uremia so hopefully will improve with HD. If not, this may be related to infection. CT head would be appropriate once safe.  MRSA bacteremia Noted on admission blood cultures. Patient started on Vancomycin IV empirically. Infectious disease consulted. TDC removed. Transthoracic Echocardiogram without  mention of vegetation. -Infectious disease recommendations: Vancomycin/Ancef, pending today  Leukocytosis In setting of above but is trended up slightly. Continues to trend up slowly after initial improvement. May need to assess for persistent infection. Repeat blood cultures have been negative.  ESRD on HD BUN and potassium elevated -Nephrology management: [facility] for further HD while in ICU  Anemia of chronic disease In setting of kidney disease. Stable.  Essential hypertension On amlodipine and metoprolol as an outpatient -Continue amlodipine and metoprolol  Diabetes mellitus, type 2 Patient is on Levemir 24 units qhs as an outpatient. -Continue SSI -Levemir at dose of 20 units qhs  History of colon cancer S/p resection.  Hyperlipidemia -Continue Lipitor  Ectatic abdominal aorta Aortic ultrasound obtained for pulsatile mass. In setting of smoker. Recommendations for repeat ultrasound in 5 years.   DVT prophylaxis: SCDs Code Status:   Code Status: Full Code Family Communication: None at bedside Disposition Plan: Transfer to ICU. Discharge pending continued workup and management of epidural abscess and bacteremia   Consultants:   Neurosurgery  Nephrology  Infectious disease  Procedures:   HEMODIALYSIS  CERVICAL FOUR- THORACIC TWO LAMINECTOMY AND FUSION (01/06/2020)  TRANSTHORACIC ECHOCARDIOGRAM (01/07/2020)  Antimicrobials:  Vancomycin  Ancef   Subjective: Patient is confused  Objective: Vitals:   01/10/20 1258 01/10/20 1332 01/10/20 1353 01/10/20 1432  BP: (!) 156/69 (!) 148/84 (!) 199/78 (!) 180/75  Pulse: (!) 102 (!) 101 (!) 102   Resp: (!) 24 (!) 36    Temp:   (!) 97.3 F (36.3 C)   TempSrc:   Oral   SpO2: 97% 99%    Weight:      Height:        Intake/Output Summary (Last 24 hours) at 01/10/2020 1502 Last data filed at 01/10/2020 1300 Gross per 24  hour  Intake 785.58 ml  Output 1034 ml  Net -248.42 ml   Filed Weights   01/09/20 2007  01/10/20 0500 01/10/20 0742  Weight: 92.2 kg 92.2 kg 90.8 kg    Examination:  General exam: Appears calm and comfortable Respiratory system: Clear to auscultation. Respiratory effort normal. Cardiovascular system: S1 & S2 heard, RRR. No murmurs, rubs, gallops or clicks. Gastrointestinal system: Abdomen is nondistended, soft and nontender. Normal bowel sounds heard. Central nervous system: Alert and not oriented. Fidgety.  Musculoskeletal: No calf tenderness Skin: No cyanosis. No rashes Psychiatry: Judgement and insight appear impaired.   Data Reviewed: I have personally reviewed following labs and imaging studies  CBC Lab Results  Component Value Date   WBC 19.9 (H) 01/10/2020   RBC 3.50 (L) 01/10/2020   HGB 9.3 (L) 01/10/2020   HCT 26.7 (L) 01/10/2020   MCV 76.3 (L) 01/10/2020   MCH 26.6 01/10/2020   PLT 255 01/10/2020   MCHC 34.8 01/10/2020   RDW 17.2 (H) 01/10/2020   LYMPHSABS 1.0 01/04/2020   MONOABS 1.5 (H) 01/04/2020   EOSABS 0.0 01/04/2020   BASOSABS 0.0 03/88/8280     Last metabolic panel Lab Results  Component Value Date   NA 131 (L) 01/10/2020   K 4.5 01/10/2020   CL 91 (L) 01/10/2020   CO2 21 (L) 01/10/2020   BUN 119 (H) 01/10/2020   CREATININE 12.16 (H) 01/10/2020   GLUCOSE 222 (H) 01/10/2020   GFRNONAA 4 (L) 01/10/2020   GFRAA 4 (L) 01/10/2020   CALCIUM 7.8 (L) 01/10/2020   PHOS 9.6 (H) 01/10/2020   PROT 7.6 01/10/2020   ALBUMIN 1.9 (L) 01/10/2020   BILITOT 2.5 (H) 01/10/2020   ALKPHOS 98 01/10/2020   AST 27 01/10/2020   ALT 24 01/10/2020   ANIONGAP 19 (H) 01/10/2020    CBG (last 3)  Recent Labs    01/10/20 0413 01/10/20 0723 01/10/20 1224  GLUCAP 208* 190* 152*     GFR: Estimated Creatinine Clearance: 6.3 mL/min (A) (by C-G formula based on SCr of 12.16 mg/dL (H)).  Coagulation Profile: Recent Labs  Lab 01/04/20 1213  INR 1.3*    Recent Results (from the past 240 hour(s))  Culture, blood (routine x 2)     Status: Abnormal     Collection Time: 01/04/20 12:13 PM   Specimen: BLOOD RIGHT HAND  Result Value Ref Range Status   Specimen Description   Final    BLOOD RIGHT HAND Performed at [facility] [address], [place], [place] [address]    Special Requests   Final    BOTTLES DRAWN AEROBIC AND ANAEROBIC Blood Culture adequate volume Performed at [facility] [address] [place], [place] [address]    Culture  Setup Time   Final    GRAM POSITIVE COCCI IN BOTH AEROBIC AND ANAEROBIC BOTTLES CRITICAL VALUE NOTED.  VALUE IS CONSISTENT WITH PREVIOUSLY REPORTED AND CALLED VALUE.    Culture (A)  Final    STAPHYLOCOCCUS AUREUS SUSCEPTIBILITIES PERFORMED ON PREVIOUS CULTURE WITHIN THE LAST 5 DAYS. Performed at [facility], [place] [address], [place], [place] [address]    Report Status 01/07/2020 FINAL  Final  Culture, blood (routine x 2)     Status: Abnormal   Collection Time: 01/04/20 12:13 PM   Specimen: Site Not Specified; Blood  Result Value Ref Range Status   Specimen Description   Final    SITE NOT SPECIFIED Performed at [facility], [address] [place], [place] [address]    Special Requests   Final    BOTTLES DRAWN AEROBIC AND ANAEROBIC Blood Culture adequate volume Performed at [facility] [address] [place], [place] [address]    Culture  Setup Time   Final    GRAM POSITIVE COCCI IN BOTH AEROBIC AND ANAEROBIC BOTTLES Organism ID to follow CRITICAL RESULT CALLED TO, READ BACK BY AND VERIFIED WITH: D. WOFFORD PHARMD, AT 0745 01/05/20 BY Rush Landmark Performed at [facility], [place] [address] [place], [place] [address]    Culture METHICILLIN RESISTANT STAPHYLOCOCCUS AUREUS (A)  Final   Report Status 01/07/2020 FINAL  Final   Organism ID, Bacteria METHICILLIN RESISTANT STAPHYLOCOCCUS AUREUS  Final      Susceptibility   Methicillin resistant staphylococcus aureus - MIC*    CIPROFLOXACIN >=8 RESISTANT  Resistant     ERYTHROMYCIN >=8 RESISTANT Resistant     GENTAMICIN <=0.5 SENSITIVE Sensitive     OXACILLIN >=4 RESISTANT Resistant     TETRACYCLINE <=1 SENSITIVE Sensitive     VANCOMYCIN <=0.5 SENSITIVE Sensitive     TRIMETH/SULFA <=10 SENSITIVE Sensitive     CLINDAMYCIN <=0.25 SENSITIVE Sensitive     RIFAMPIN <=0.5 SENSITIVE Sensitive     Inducible Clindamycin NEGATIVE Sensitive     * METHICILLIN RESISTANT STAPHYLOCOCCUS AUREUS  Blood Culture ID Panel (Reflexed)     Status: Abnormal   Collection Time: 01/04/20 12:13 PM  Result Value Ref Range Status   Enterococcus faecalis NOT DETECTED NOT DETECTED Final   Enterococcus Faecium NOT DETECTED NOT DETECTED Final   Listeria monocytogenes NOT DETECTED NOT DETECTED Final   Staphylococcus species DETECTED (A) NOT DETECTED Final    Comment: CRITICAL RESULT CALLED TO, READ BACK BY AND VERIFIED WITH: D. WOFFORD PHARMD, AT 0745 01/05/20 BY D. VANHOOK    Staphylococcus aureus (BCID) DETECTED (A) NOT DETECTED Final    Comment: Methicillin (oxacillin)-resistant Staphylococcus aureus (MRSA). MRSA is predictably resistant to beta-lactam antibiotics (except ceftaroline). Preferred therapy is vancomycin unless clinically contraindicated. Patient requires contact precautions if  hospitalized. CRITICAL RESULT CALLED TO, READ BACK BY AND VERIFIED WITH: D. WOFFORD PHARMD, AT 0745 01/05/20 BY D. VANHOOK    Staphylococcus epidermidis NOT DETECTED NOT DETECTED Final   Staphylococcus lugdunensis NOT DETECTED NOT DETECTED Final   Streptococcus species NOT DETECTED NOT DETECTED Final   Streptococcus agalactiae NOT DETECTED NOT DETECTED Final   Streptococcus pneumoniae NOT DETECTED NOT DETECTED Final   Streptococcus pyogenes NOT DETECTED NOT DETECTED Final   A.calcoaceticus-baumannii NOT DETECTED NOT DETECTED Final   Bacteroides fragilis NOT DETECTED NOT DETECTED Final   Enterobacterales NOT DETECTED NOT DETECTED Final   Enterobacter cloacae complex NOT DETECTED  NOT DETECTED Final   Escherichia coli NOT DETECTED NOT DETECTED Final   Klebsiella aerogenes NOT DETECTED NOT DETECTED Final   Klebsiella oxytoca NOT DETECTED NOT DETECTED Final   Klebsiella pneumoniae NOT DETECTED NOT DETECTED Final   Proteus species NOT DETECTED NOT DETECTED Final   Salmonella species NOT DETECTED NOT DETECTED Final   Serratia marcescens NOT DETECTED NOT DETECTED Final   Haemophilus influenzae NOT DETECTED NOT DETECTED Final   Neisseria meningitidis NOT DETECTED NOT DETECTED Final   Pseudomonas aeruginosa NOT DETECTED NOT DETECTED Final   Stenotrophomonas maltophilia NOT DETECTED NOT DETECTED Final   Candida albicans NOT DETECTED NOT DETECTED Final   Candida auris NOT DETECTED NOT DETECTED Final   Candida glabrata NOT DETECTED NOT DETECTED Final   Candida krusei NOT DETECTED NOT  DETECTED Final   Candida parapsilosis NOT DETECTED NOT DETECTED Final   Candida tropicalis NOT DETECTED NOT DETECTED Final   Cryptococcus neoformans/gattii NOT DETECTED NOT DETECTED Final   Meth resistant mecA/C and MREJ DETECTED (A) NOT DETECTED Final    Comment: CRITICAL RESULT CALLED TO, READ BACK BY AND VERIFIED WITH: D. WOFFORD PHARMD, AT 0745 01/05/20 BY Rush Landmark Performed at [facility], [place] [address], [place], [place] [address]   SARS Coronavirus 2 by RT PCR (hospital order, performed in [facility] hospital lab) Nasopharyngeal Nasopharyngeal Swab     Status: None   Collection Time: 01/04/20  2:45 PM   Specimen: Nasopharyngeal Swab  Result Value Ref Range Status   SARS Coronavirus 2 NEGATIVE NEGATIVE Final    Comment: (NOTE) SARS-CoV-2 target nucleic acids are NOT DETECTED.  The SARS-CoV-2 RNA is generally detectable in upper and lower respiratory specimens during the acute phase of infection. The lowest concentration of SARS-CoV-2 viral copies this assay can detect is 250 copies / mL. A negative result does not preclude SARS-CoV-2 infection and should not be used as the  sole basis for treatment or other patient management decisions.  A negative result may occur with improper specimen collection / handling, submission of specimen other than nasopharyngeal swab, presence of viral mutation(s) within the areas targeted by this assay, and inadequate number of viral copies (<250 copies / mL). A negative result must be combined with clinical observations, patient history, and epidemiological information.  Fact Sheet for Patients:   StrictlyIdeas.no  Fact Sheet for Healthcare Providers: BankingDealers.co.za  This test is not yet approved or  cleared by the [place] FDA and has been authorized for detection and/or diagnosis of SARS-CoV-2 by FDA under an Emergency Use Authorization (EUA).  This EUA will remain in effect (meaning this test can be used) for the duration of the COVID-19 declaration under Section 564(b)(1) of the Act, 21 U.S.C. section 360bbb-3(b)(1), unless the authorization is terminated or revoked sooner.  Performed at [facility], [place] [address], [place], [place] [address]   Urine culture     Status: None   Collection Time: 01/04/20  9:20 PM   Specimen: Urine, Random  Result Value Ref Range Status   Specimen Description   Final    URINE, RANDOM Performed at [facility] [address], [place], [place] [address]    Special Requests   Final    NONE Performed at [facility], [place] [address], [place], [place] [address]    Culture   Final    NO GROWTH Performed at [facility], [place] [address] [place], [place] [address]    Report Status 01/05/2020 FINAL  Final  Aerobic/Anaerobic Culture (surgical/deep wound)     Status: None (Preliminary result)   Collection Time: 01/06/20  2:52 PM   Specimen: Soft Tissue, Other  Result Value Ref Range Status   Specimen Description WOUND  Final   Special Requests POSTOP CERVICAL  LAMINECTOMY AND FUSION SITE  Final   Gram Stain   Final    NO WBC SEEN ABUNDANT GRAM POSITIVE COCCI Performed at [facility], [place] [address], [place], [place] [address]    Culture   Final    MODERATE METHICILLIN RESISTANT STAPHYLOCOCCUS AUREUS NO ANAEROBES ISOLATED; CULTURE IN PROGRESS FOR 5 DAYS    Report Status PENDING  Incomplete   Organism ID, Bacteria METHICILLIN RESISTANT STAPHYLOCOCCUS AUREUS  Final      Susceptibility  Methicillin resistant staphylococcus aureus - MIC*    CIPROFLOXACIN >=8 RESISTANT Resistant     ERYTHROMYCIN >=8 RESISTANT Resistant     GENTAMICIN <=0.5 SENSITIVE Sensitive     OXACILLIN >=4 RESISTANT Resistant     TETRACYCLINE <=1 SENSITIVE Sensitive     VANCOMYCIN 1 SENSITIVE Sensitive     TRIMETH/SULFA <=10 SENSITIVE Sensitive     CLINDAMYCIN <=0.25 SENSITIVE Sensitive     RIFAMPIN <=0.5 SENSITIVE Sensitive     Inducible Clindamycin NEGATIVE Sensitive     * MODERATE METHICILLIN RESISTANT STAPHYLOCOCCUS AUREUS  Culture, blood (routine x 2)     Status: None (Preliminary result)   Collection Time: 01/07/20  7:10 AM   Specimen: BLOOD  Result Value Ref Range Status   Specimen Description BLOOD RIGHT ANTECUBITAL  Final   Special Requests   Final    BOTTLES DRAWN AEROBIC ONLY Blood Culture results may not be optimal due to an inadequate volume of blood received in culture bottles   Culture   Final    NO GROWTH 3 DAYS Performed at [facility], [place] [address], [place], [place] [address]    Report Status PENDING  Incomplete  Culture, blood (routine x 2)     Status: None (Preliminary result)   Collection Time: 01/07/20  7:21 AM   Specimen: BLOOD RIGHT HAND  Result Value Ref Range Status   Specimen Description BLOOD RIGHT HAND  Final   Special Requests   Final    BOTTLES DRAWN AEROBIC AND ANAEROBIC Blood Culture results may not be optimal due to an excessive volume of blood received in culture bottles   Culture   Final    NO GROWTH 3  DAYS Performed at [facility], [place] [address] [place], [place] [address]    Report Status PENDING  Incomplete  Culture, blood (Routine X 2) w Reflex to ID Panel     Status: None (Preliminary result)   Collection Time: 01/07/20  7:15 PM   Specimen: BLOOD  Result Value Ref Range Status   Specimen Description BLOOD BLOOD RIGHT FOREARM  Final   Special Requests   Final    BOTTLES DRAWN AEROBIC AND ANAEROBIC Blood Culture adequate volume   Culture   Final    NO GROWTH 3 DAYS Performed at [facility], [place] [address], [place], [place] [address]    Report Status PENDING  Incomplete  Culture, blood (Routine X 2) w Reflex to ID Panel     Status: None (Preliminary result)   Collection Time: 01/07/20  7:26 PM   Specimen: BLOOD RIGHT HAND  Result Value Ref Range Status   Specimen Description BLOOD RIGHT HAND  Final   Special Requests   Final    BOTTLES DRAWN AEROBIC AND ANAEROBIC Blood Culture adequate volume   Culture   Final    NO GROWTH 3 DAYS Performed at [facility], [place] [address], [place], [place] [address]    Report Status PENDING  Incomplete        Radiology Studies: US AORTA  Result Date: 01/09/2020 CLINICAL DATA:  Pulsatile abdominal mass. Evaluate for possible abdominal aortic aneurysm. EXAM: ULTRASOUND OF ABDOMINAL AORTA TECHNIQUE: Ultrasound examination of the abdominal aorta and proximal common iliac arteries was performed to evaluate for aneurysm. Additional color and Doppler images of the distal aorta were obtained to document patency. COMPARISON:  CT abdomen and pelvis 03/29/2012 FINDINGS: Abdominal aortic measurements as follows: Proximal:  2.6 x 2.6 cm Mid:  2.2  x 2.6 cm Distal:  1.9 x 1.8 cm Patent: Yes, peak systolic velocity is 277.4 cm/s Right common iliac artery: 1.3 x 1.5 cm Left common iliac artery: 1.2 x 1.2 cm Study quality is degraded by patient motion and overlying bowel gas. IMPRESSION: 1. No evidence for abdominal aortic aneurysm. 2.  Ectatic abdominal aorta at risk for aneurysm development. Recommend followup by ultrasound in 5 years. This recommendation follows ACR consensus guidelines: White Paper of the ACR Incidental Findings Committee II on Vascular Findings. J Am Coll. Electronically Signed   By: Nolon Nations M.D.   On: 01/09/2020 09:15        Scheduled Meds: . (feeding supplement) PROSource Plus  30 mL Oral BID BM  . amLODipine  10 mg Oral QHS  . atorvastatin  20 mg Oral QHS  . Chlorhexidine Gluconate Cloth  6 each Topical Q0600  . Chlorhexidine Gluconate Cloth  6 each Topical Q0600  . insulin aspart  0-6 Units Subcutaneous Q4H  . insulin detemir  20 Units Subcutaneous QHS  . metoprolol tartrate  50 mg Oral BID  . multivitamin  1 tablet Oral QHS   Continuous Infusions: . sodium chloride 10 mL/hr at 01/06/20 1304  . rifampin (RIFADIN) IVPB    . vancomycin 150 mL/hr at 01/10/20 1300     LOS: 6 days     Cordelia Poche, MD [facility] 01/10/2020, 3:02 PM  If 7PM-7AM, please contact night-coverage [email or web address]

## 2020-01-10 NOTE — Progress Notes (Signed)
[facility] Progress Note   Subjective:  Seen on HD today - 2L net UF goal. Denies pain, CP, dyspnea. Having myoclonic jerking in arms/legs + anxious - wants to get off HD - attempted to explain that needs HD to improve jerking - BUN 119 today. Will give Ativan 0.5mg  IV to calm and see if can get him a full dialysis.  Objective Vitals:   01/09/20 1644 01/09/20 2007 01/10/20 0414 01/10/20 0500  BP: (!) 160/73 (!) 177/83 (!) 184/76   Pulse: 90 100 95   Resp: 18 20 20    Temp: 98.8 F (37.1 C) 98 F (36.7 C) 98.6 F (37 C)   TempSrc: Oral Oral Oral   SpO2: 99% 99% 100%   Weight:  92.2 kg  92.2 kg  Height:       Physical Exam General: Ill appearing man, confused/agitated today Heart: RRR; no murmur Lungs: CTA anteriorly Abdomen: soft, non-tender Extremities: No LE edema, + myoclonic jerking Dialysis Access: L AVF (cannulated)  Additional Objective Labs: Basic Metabolic Panel: Recent Labs  Lab 01/08/20 0210 01/09/20 0307 01/10/20 0230  NA 135 135 131*  K 4.0 5.6* 4.5  CL 94* 94* 91*  CO2 22 20* 21*  GLUCOSE 245* 179* 222*  BUN 75* 102* 119*  CREATININE 9.11* 10.85* 12.16*  CALCIUM 8.2* 8.0* 7.8*  PHOS  --  8.6* 9.6*   Liver Function Tests: Recent Labs  Lab 01/04/20 1213 01/09/20 0307 01/10/20 0230  AST 21  --   --   ALT 20  --   --   ALKPHOS 84  --   --   BILITOT 1.1  --   --   PROT 9.1*  --   --   ALBUMIN 3.2* 2.0* 2.1*   CBC: Recent Labs  Lab 01/04/20 1213 01/05/20 0502 01/06/20 0258 01/06/20 1542 01/07/20 0720 01/07/20 0720 01/08/20 0210 01/09/20 0451 01/10/20 0230  WBC 25.3*   < > 25.8*  --  16.8*   < > 15.4* 18.8* 19.9*  NEUTROABS 22.5*  --   --   --   --   --   --   --   --   HGB 10.0*   < > 8.7*   < > 8.7*   < > 9.6* 10.0* 9.3*  HCT 28.6*   < > 24.9*   < > 25.0*   < > 27.4* 28.7* 26.7*  MCV 75.5*   < > 73.5*  --  74.4*  --  74.5* 76.1* 76.3*  PLT 333   < > 290  --  223   < > 232 270 255   < > = values in this interval not  displayed.   Blood Culture    Component Value Date/Time   SDES BLOOD RIGHT HAND 01/07/2020 1926   SPECREQUEST  01/07/2020 1926    BOTTLES DRAWN AEROBIC AND ANAEROBIC Blood Culture adequate volume   CULT  01/07/2020 1926    NO GROWTH 2 DAYS Performed at [facility], [place] [address] [place], [place] [address]    REPTSTATUS PENDING 01/07/2020 1926   CBG: Recent Labs  Lab 01/09/20 1134 01/09/20 1656 01/09/20 2004 01/10/20 0006 01/10/20 0723  GLUCAP 243* 191* 180* 183* 190*   Studies/Results: US AORTA  Result Date: 01/09/2020 CLINICAL DATA:  Pulsatile abdominal mass. Evaluate for possible abdominal aortic aneurysm. EXAM: ULTRASOUND OF ABDOMINAL AORTA TECHNIQUE: Ultrasound examination of the abdominal aorta and proximal common iliac arteries was performed to evaluate for aneurysm. Additional color  and Doppler images of the distal aorta were obtained to document patency. COMPARISON:  CT abdomen and pelvis 03/29/2012 FINDINGS: Abdominal aortic measurements as follows: Proximal:  2.6 x 2.6 cm Mid:  2.2 x 2.6 cm Distal:  1.9 x 1.8 cm Patent: Yes, peak systolic velocity is 116.5 cm/s Right common iliac artery: 1.3 x 1.5 cm Left common iliac artery: 1.2 x 1.2 cm Study quality is degraded by patient motion and overlying bowel gas. IMPRESSION: 1. No evidence for abdominal aortic aneurysm. 2. Ectatic abdominal aorta at risk for aneurysm development. Recommend followup by ultrasound in 5 years. This recommendation follows ACR consensus guidelines: White Paper of the ACR Incidental Findings Committee II on Vascular Findings. J Am Coll. Electronically Signed   By: Nolon Nations M.D.   On: 01/09/2020 09:15   Medications: . sodium chloride 10 mL/hr at 01/06/20 1304  . vancomycin     . (feeding supplement) PROSource Plus  30 mL Oral BID BM  . amLODipine  10 mg Oral QHS  . atorvastatin  20 mg Oral QHS  . Chlorhexidine Gluconate Cloth  6 each Topical Q0600  . Chlorhexidine Gluconate Cloth  6  each Topical Q0600  . insulin aspart  0-6 Units Subcutaneous Q4H  . insulin detemir  20 Units Subcutaneous QHS  . metoprolol tartrate  50 mg Oral BID  . multivitamin  1 tablet Oral QHS    Dialysis Orders: [facility] MWF 4.15 hrs 180NRe EDW99kg 2K/2Ca400/800 RIJ TDC (removed 01/07/20) using LUE AVF - No heparin - Calcitriol 1.88mcg PO q HD - Mircera 150 mcg IV q 2 weeks (last given on 12/29/19)  Assessment/Plan: 1. Epidural abscess: IR unable to aspirate. MRI showed abscess extending from C4-T2 with cord compression.s/pC4-T2 laminectomy and fusion for decompression8/5/21 with improved pain.  2. MRSA bacteremia: Repeat BCx 8/6 negative. TTE 8/6 without vegetation. TDC removed 8/6, using LUE AVF. On Vancomycin. ID following. 3. Weakness: Felt d/t #1.  4. ESRD: Continue HD on MWF schedule. BUN > 100. Attempting to get full dialysis today but he is very agitated right now. 5. HTN/volume: BP high - no significant LE edema. 2L UFG today. 6. Anemia: Hgb 9.3 - due for ESA later this week. 7. Secondary hyperparathyroidism:  Phos high - started on Renvela 1600mg  TID on 8/8 - follow. 8. Nutrition: Alb very low - continue supplements/ 9. Confusion: meds v. infection v. uremia - follow.  Veneta Penton, PA-C 01/10/2020, 8:23 AM  [employer]

## 2020-01-10 NOTE — Progress Notes (Signed)
Pt presented  with mild jerking and temors to BUE and LUE. Reports that he can't stay on dialysis due to feeling "like I'm falling". Pt resting with eyes closed, with name called pt startles easily. Brooke Bonito on unit to assess and enforced need for patient to remain on dialysis. Pt noted to be agitated and restless, constantly moving and occluding lines. Requirijng one to one supervision and constant redirection.

## 2020-01-10 NOTE — Progress Notes (Signed)
CRITICAL VALUE ALERT  Critical Value: ABG O2: <31  Date & Time Notied: 01/10/20 1345  Provider Notified: Audria Nine, DO  Orders Received/Actions taken: Stated to continue to monitor O2 sats and continue with transfer to ICU. Awaiting bed placement.

## 2020-01-11 ENCOUNTER — Inpatient Hospital Stay (HOSPITAL_COMMUNITY): Payer: No Typology Code available for payment source

## 2020-01-11 DIAGNOSIS — Z789 Other specified health status: Secondary | ICD-10-CM

## 2020-01-11 DIAGNOSIS — B9562 Methicillin resistant Staphylococcus aureus infection as the cause of diseases classified elsewhere: Secondary | ICD-10-CM | POA: Diagnosis not present

## 2020-01-11 DIAGNOSIS — R7881 Bacteremia: Secondary | ICD-10-CM | POA: Diagnosis not present

## 2020-01-11 LAB — CBC
HCT: 25.6 % — ABNORMAL LOW (ref 39.0–52.0)
Hemoglobin: 8.7 g/dL — ABNORMAL LOW (ref 13.0–17.0)
MCH: 25.9 pg — ABNORMAL LOW (ref 26.0–34.0)
MCHC: 34 g/dL (ref 30.0–36.0)
MCV: 76.2 fL — ABNORMAL LOW (ref 80.0–100.0)
Platelets: 290 10*3/uL (ref 150–400)
RBC: 3.36 MIL/uL — ABNORMAL LOW (ref 4.22–5.81)
RDW: 17.2 % — ABNORMAL HIGH (ref 11.5–15.5)
WBC: 20.2 10*3/uL — ABNORMAL HIGH (ref 4.0–10.5)
nRBC: 0 % (ref 0.0–0.2)

## 2020-01-11 LAB — RENAL FUNCTION PANEL
Albumin: 2.4 g/dL — ABNORMAL LOW (ref 3.5–5.0)
Anion gap: 18 — ABNORMAL HIGH (ref 5–15)
BUN: 50 mg/dL — ABNORMAL HIGH (ref 8–23)
CO2: 24 mmol/L (ref 22–32)
Calcium: 7.8 mg/dL — ABNORMAL LOW (ref 8.9–10.3)
Chloride: 96 mmol/L — ABNORMAL LOW (ref 98–111)
Creatinine, Ser: 6.11 mg/dL — ABNORMAL HIGH (ref 0.61–1.24)
GFR calc Af Amer: 10 mL/min — ABNORMAL LOW (ref >60)
GFR calc non Af Amer: 9 mL/min — ABNORMAL LOW (ref >60)
Glucose, Bld: 175 mg/dL — ABNORMAL HIGH (ref 70–99)
Phosphorus: 6.9 mg/dL — ABNORMAL HIGH (ref 2.5–4.6)
Potassium: 4.3 mmol/L (ref 3.5–5.1)
Sodium: 138 mmol/L (ref 135–145)

## 2020-01-11 LAB — POCT I-STAT 7, (LYTES, BLD GAS, ICA,H+H)
Acid-Base Excess: 15 mmol/L — ABNORMAL HIGH (ref 0.0–2.0)
Acid-Base Excess: 2 mmol/L (ref 0.0–2.0)
Acid-Base Excess: 6 mmol/L — ABNORMAL HIGH (ref 0.0–2.0)
Bicarbonate: 26.1 mmol/L (ref 20.0–28.0)
Bicarbonate: 27.9 mmol/L (ref 20.0–28.0)
Bicarbonate: 36.4 mmol/L — ABNORMAL HIGH (ref 20.0–28.0)
Calcium, Ion: 0.92 mmol/L — ABNORMAL LOW (ref 1.15–1.40)
Calcium, Ion: 0.94 mmol/L — ABNORMAL LOW (ref 1.15–1.40)
Calcium, Ion: 0.96 mmol/L — ABNORMAL LOW (ref 1.15–1.40)
HCT: 24 % — ABNORMAL LOW (ref 39.0–52.0)
HCT: 27 % — ABNORMAL LOW (ref 39.0–52.0)
HCT: 28 % — ABNORMAL LOW (ref 39.0–52.0)
Hemoglobin: 8.2 g/dL — ABNORMAL LOW (ref 13.0–17.0)
Hemoglobin: 9.2 g/dL — ABNORMAL LOW (ref 13.0–17.0)
Hemoglobin: 9.5 g/dL — ABNORMAL LOW (ref 13.0–17.0)
O2 Saturation: 100 %
O2 Saturation: 93 %
O2 Saturation: 99 %
Patient temperature: 99
Potassium: 3.6 mmol/L (ref 3.5–5.1)
Potassium: 4.3 mmol/L (ref 3.5–5.1)
Potassium: 4.5 mmol/L (ref 3.5–5.1)
Sodium: 138 mmol/L (ref 135–145)
Sodium: 138 mmol/L (ref 135–145)
Sodium: 141 mmol/L (ref 135–145)
TCO2: 27 mmol/L (ref 22–32)
TCO2: 29 mmol/L (ref 22–32)
TCO2: 37 mmol/L — ABNORMAL HIGH (ref 22–32)
pCO2 arterial: 29 mmHg — ABNORMAL LOW (ref 32.0–48.0)
pCO2 arterial: 32.9 mmHg (ref 32.0–48.0)
pCO2 arterial: 38.8 mmHg (ref 32.0–48.0)
pH, Arterial: 7.436 (ref 7.350–7.450)
pH, Arterial: 7.592 — ABNORMAL HIGH (ref 7.350–7.450)
pH, Arterial: 7.653 (ref 7.350–7.450)
pO2, Arterial: 103 mmHg (ref 83.0–108.0)
pO2, Arterial: 234 mmHg — ABNORMAL HIGH (ref 83.0–108.0)
pO2, Arterial: 66 mmHg — ABNORMAL LOW (ref 83.0–108.0)

## 2020-01-11 LAB — GLUCOSE, CAPILLARY
Glucose-Capillary: 116 mg/dL — ABNORMAL HIGH (ref 70–99)
Glucose-Capillary: 121 mg/dL — ABNORMAL HIGH (ref 70–99)
Glucose-Capillary: 135 mg/dL — ABNORMAL HIGH (ref 70–99)
Glucose-Capillary: 137 mg/dL — ABNORMAL HIGH (ref 70–99)
Glucose-Capillary: 150 mg/dL — ABNORMAL HIGH (ref 70–99)
Glucose-Capillary: 266 mg/dL — ABNORMAL HIGH (ref 70–99)

## 2020-01-11 LAB — AEROBIC/ANAEROBIC CULTURE W GRAM STAIN (SURGICAL/DEEP WOUND): Gram Stain: NONE SEEN

## 2020-01-11 LAB — VANCOMYCIN, RANDOM: Vancomycin Rm: 22

## 2020-01-11 LAB — TRIGLYCERIDES: Triglycerides: 88 mg/dL (ref <150)

## 2020-01-11 IMAGING — CT CT HEAD W/O CM
4 series · 16 of 47 positions shown, 18 images · non-contrast
Comparison: Brain MRI [DATE].  Cervical spine MRI [DATE].

CLINICAL DATA: 68-year-old male with altered mental status.
Cervical spine epidural abscess, postop day 5. Intubated.

EXAM:
CT HEAD WITHOUT CONTRAST
TECHNIQUE: Contiguous axial images were obtained from the base of the skull
through the vertex without intravenous contrast.

[Series 3: head without · axial · non-contrast · 0.45mm/px · z∈[-206,-70]mm · 7 of 37 slices shown, 9 images]
[im 5/37  brain]
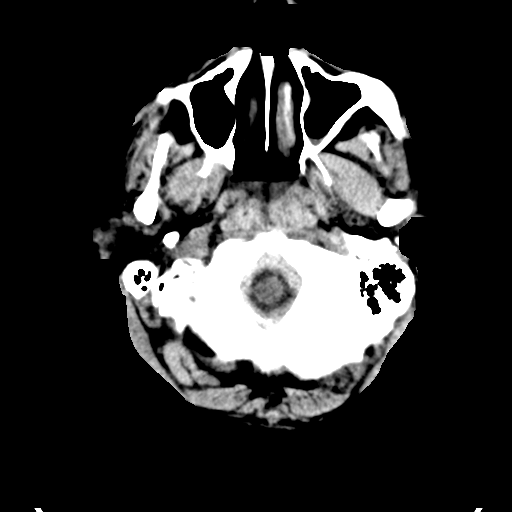
[im 5/37  bone]
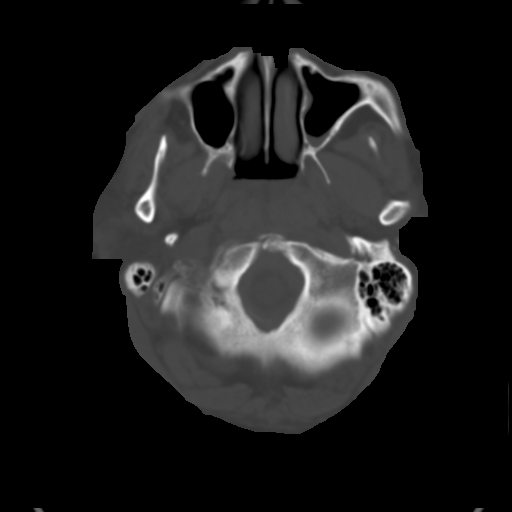
[im 10/37  brain]
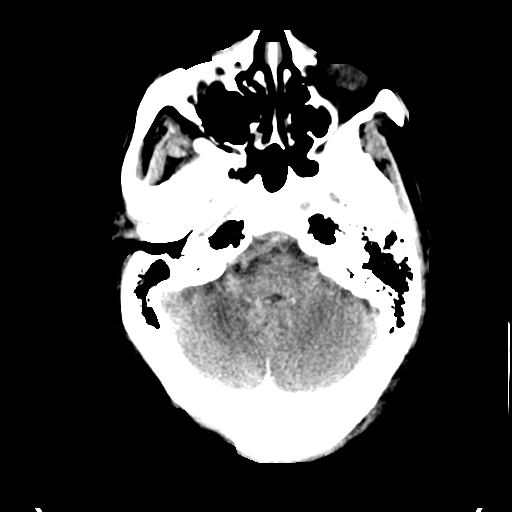
[im 14/37  brain]
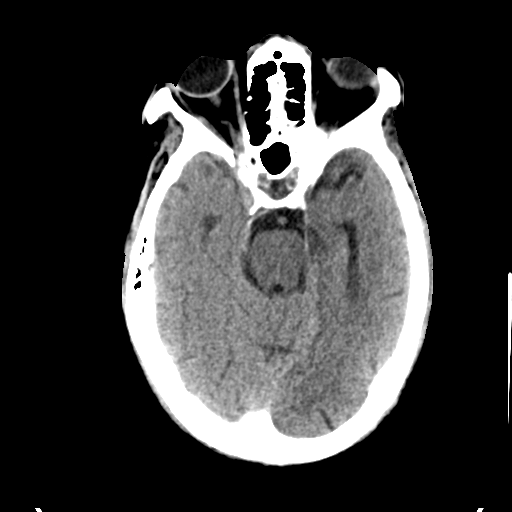
[im 19/37  brain]
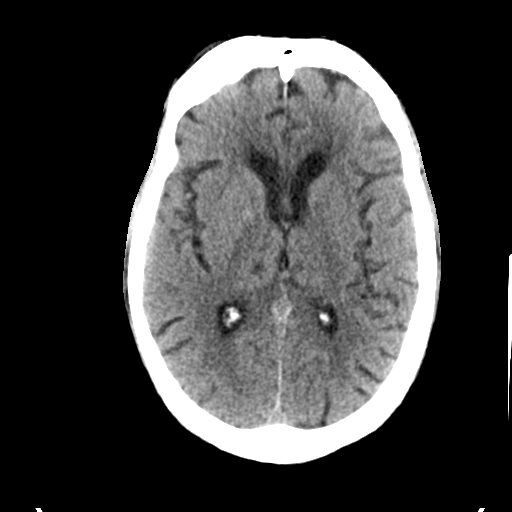
[im 23/37  brain]
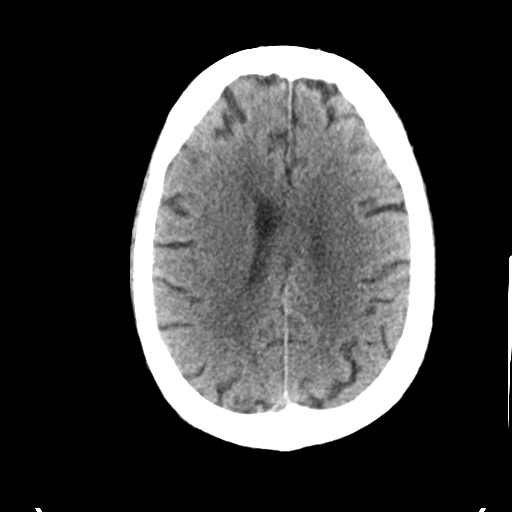
[im 23/37  bone]
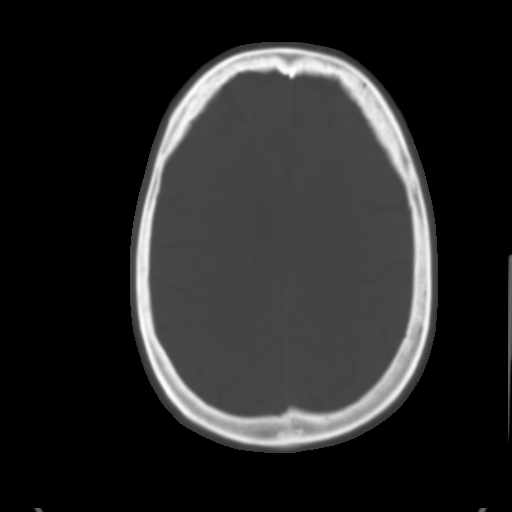
[im 28/37  brain]
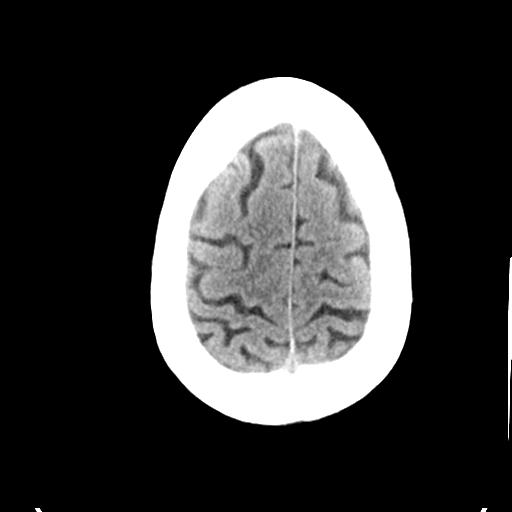
[im 32/37  brain]
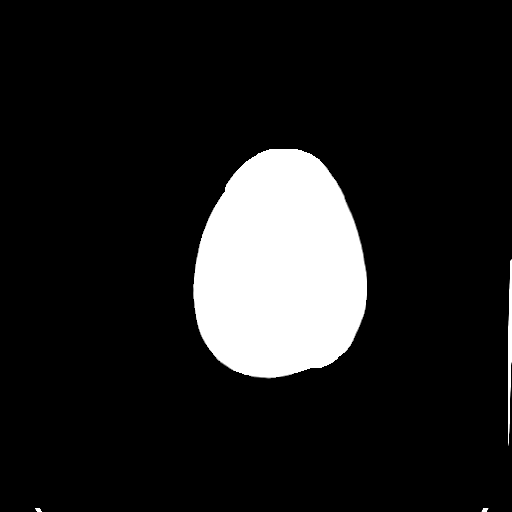

[Series 4: head bone · axial · 0.45mm/px · z∈[-208,-172]mm · 3 of 91 slices shown]
[im 10/91  bone]
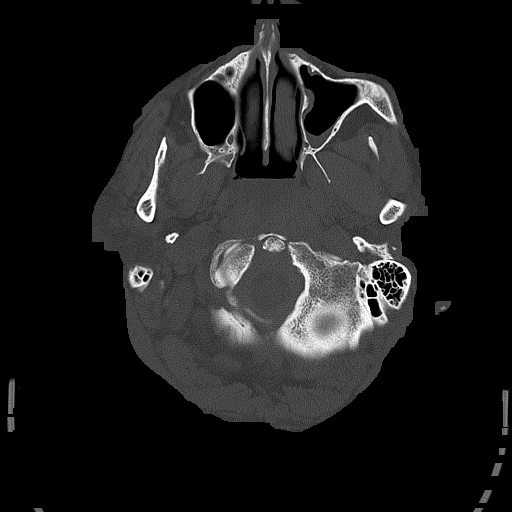
[im 19/91  bone]
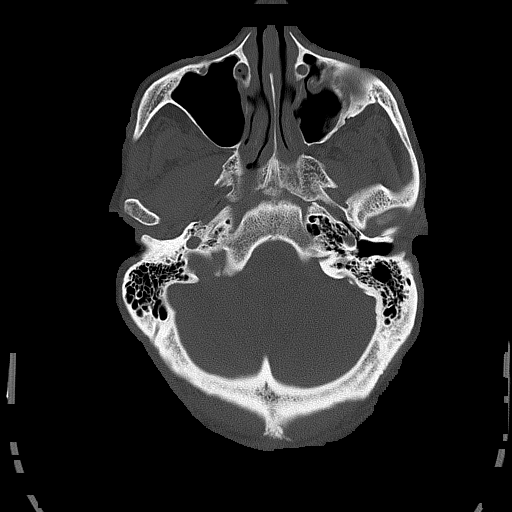
[im 28/91  bone]
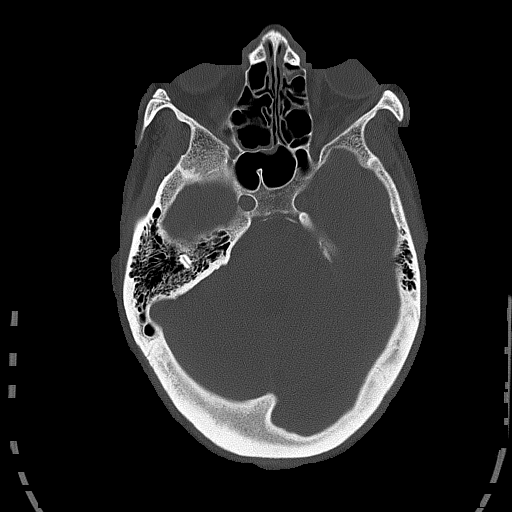

[Series 5: head without cor · coronal · non-contrast · 0.34mm/px · 3 of 74 slices shown]
[im 25/74  brain]
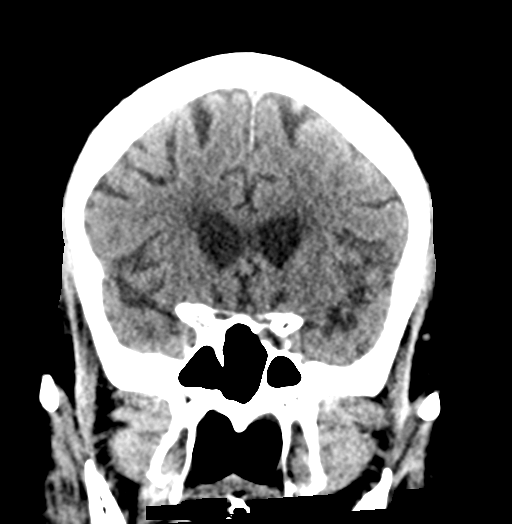
[im 33/74  brain]
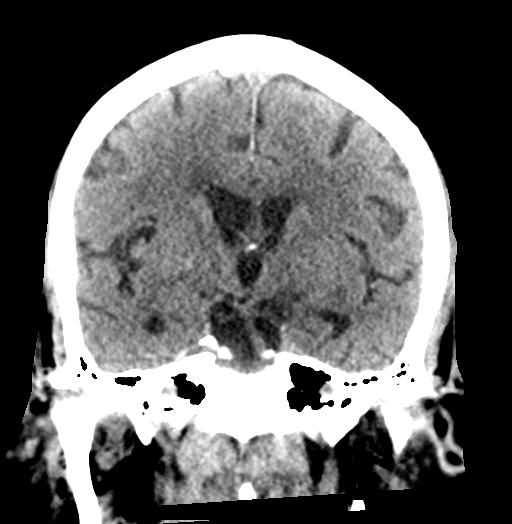
[im 41/74  brain]
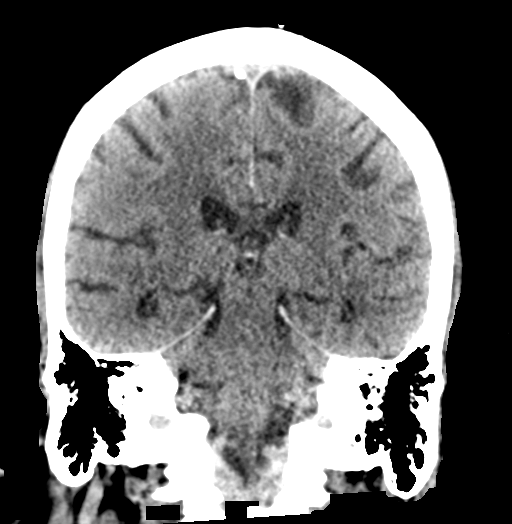

[Series 6: head without sag · sagittal · non-contrast · 0.36mm/px · 3 of 66 slices shown]
[im 22/66  brain]
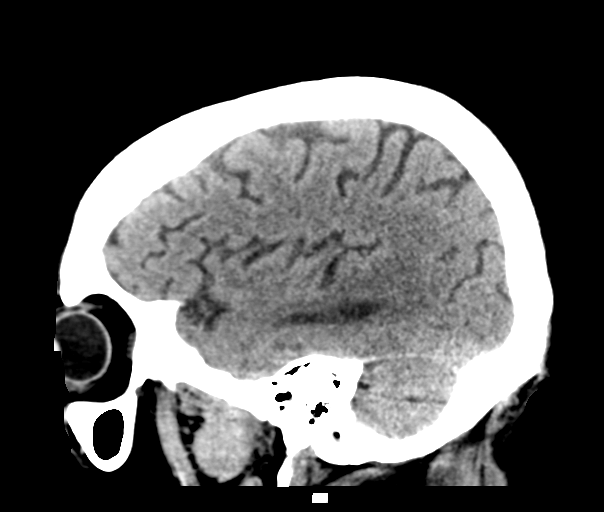
[im 33/66  brain]
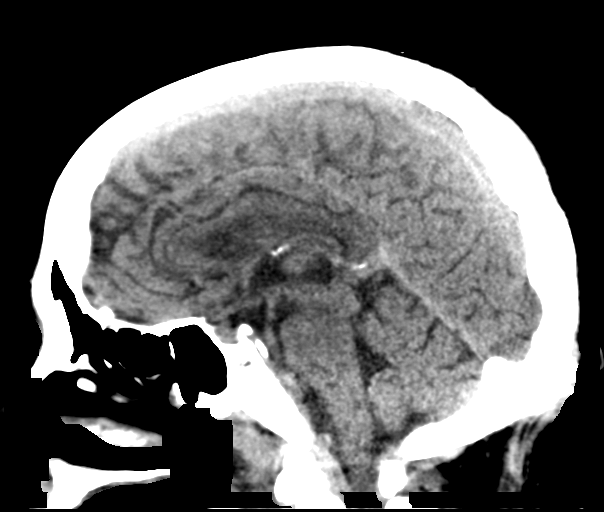
[im 44/66  brain]
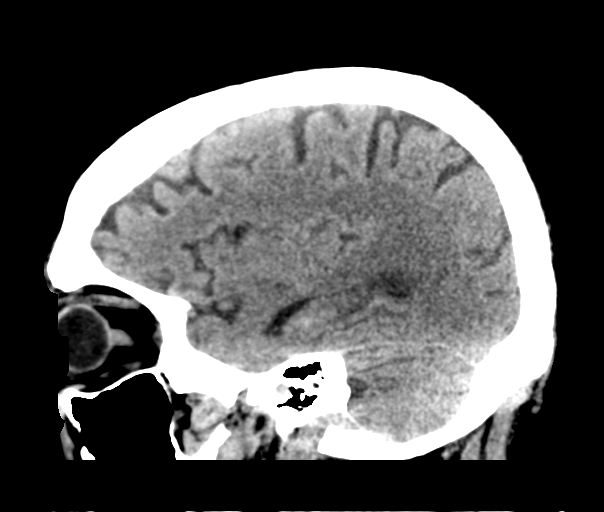

[16 of 47 positions shown; findings below may reference images not displayed]

FINDINGS: Brain: Abnormal appearance of the visible upper cervical spinal
canal on series 3, image 1, with epidural fluid or soft tissue,
probably not significantly changed from the abnormal cervical MRI
[DATE] (please see that report)

No ventriculomegaly. No intracranial mass effect. Basilar cisterns
remain patent.

Stable chronic lacunar infarct in the thalami greater on the right.
Elsewhere cerebral gray-white matter differentiation remains within
normal limits. No cortically based acute infarct identified. No
acute intracranial hemorrhage identified.

Vascular: No suspicious intracranial vascular hyperdensity.

Skull: The skull appears stable and intact.

Sinuses/Orbits: Minimal fluid in a posterior right ethmoid air cell.
Other Visualized paranasal sinuses and mastoids are stable and well
pneumatized.

Other: Partially visible midline suboccipital postoperative changes
to the scalp (series 4, image 1). No other acute orbit or scalp soft
tissue finding.
IMPRESSION: 1. No acute intracranial abnormality. Stable thalamic small vessel
disease.

2. Continued abnormal upper cervical epidural space. Partially
visible cervical posterior midline postoperative changes.

## 2020-01-11 IMAGING — DX DG CHEST 1V PORT
1 series · 2 of 2 positions shown · non-contrast
Comparison: Earlier film, same date.

CLINICAL DATA: Respiratory distress.

EXAM:
PORTABLE CHEST 1 VIEW

[Series 1: chest ap · 0.14mm/px · 2 of 2 slices shown]
[im 1/2]
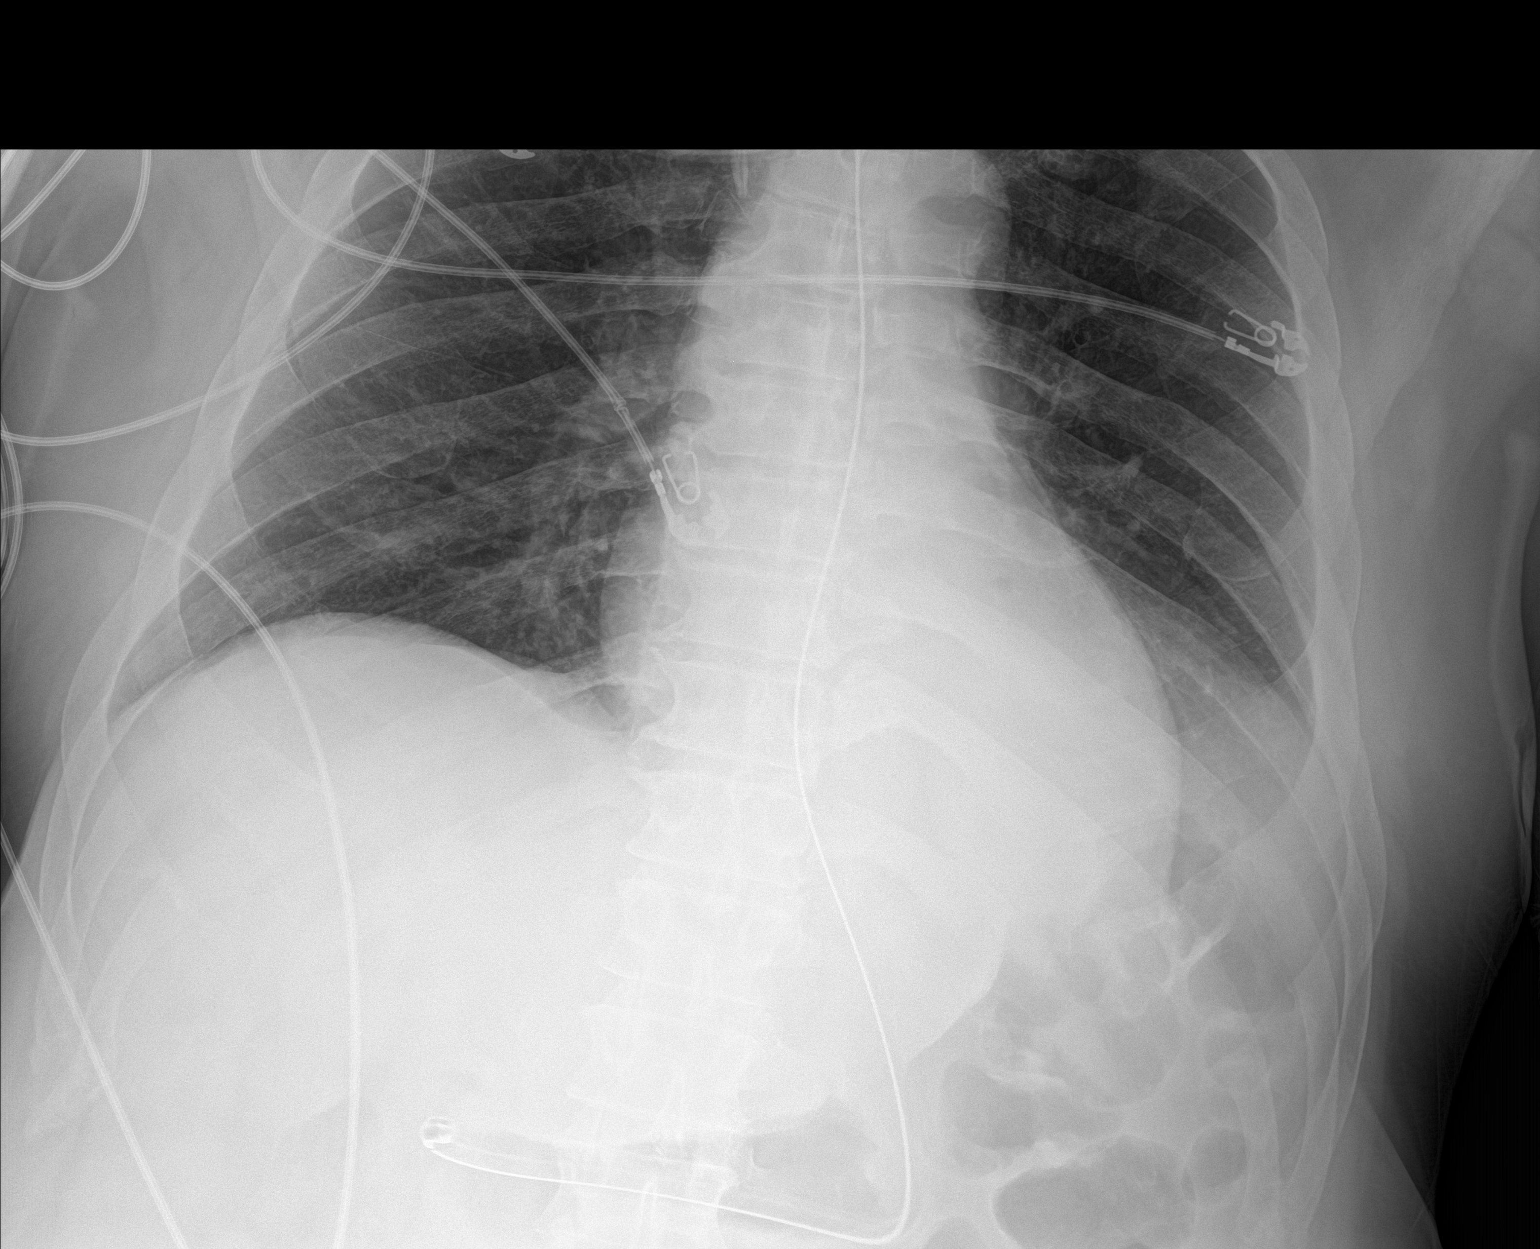
[im 2/2]
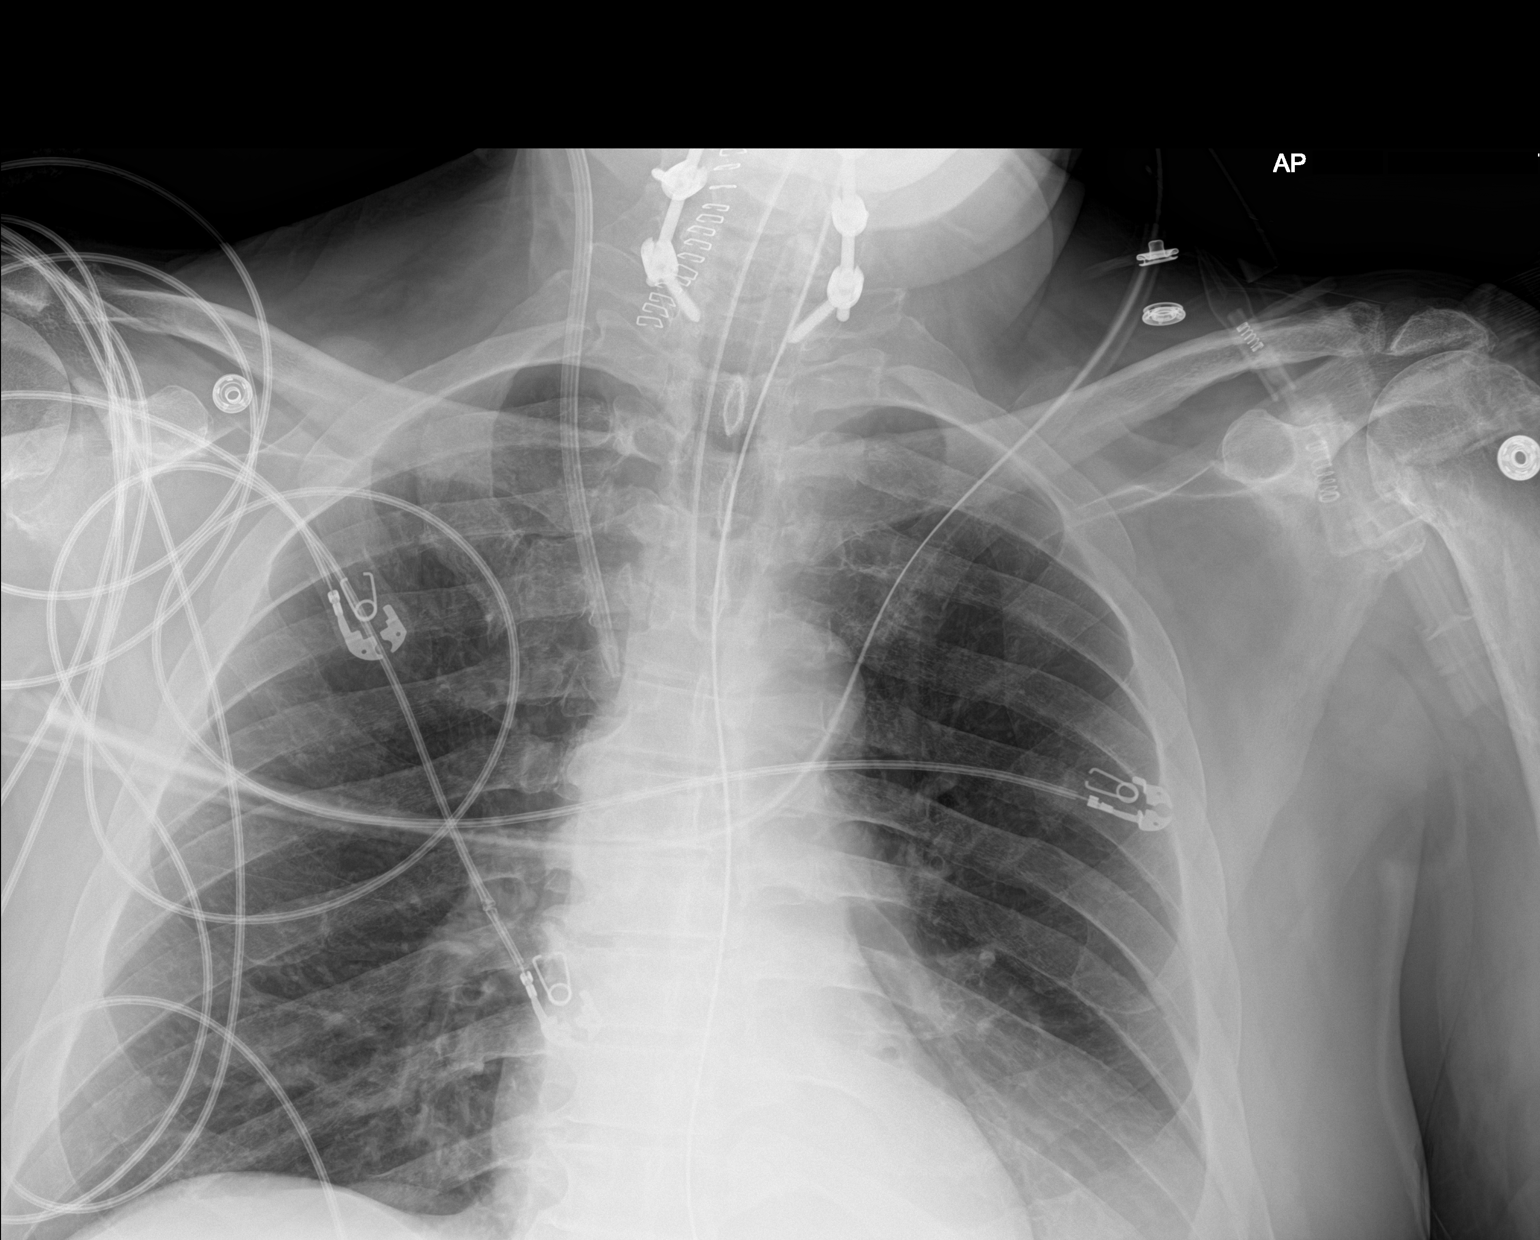

[2 of 2 positions shown; findings below may reference images not displayed]

FINDINGS: The endotracheal tube, NG tube and right IJ catheters are stable.

The cardiac silhouette, mediastinal and hilar contours are within
normal limits and stable. The lungs are clear.
IMPRESSION: 1. Stable support apparatus.
2. No acute cardiopulmonary findings.

## 2020-01-11 IMAGING — MR MR HEAD W/O CM
6 of 10 series · 27 of 48 positions shown · non-contrast
Comparison: [DATE]

CLINICAL DATA: Mental status change

EXAM:
MRI HEAD WITHOUT CONTRAST
TECHNIQUE: Multiplanar, multiecho pulse sequences of the brain and surrounding
structures were obtained without intravenous contrast.

[Series 2: DWI · axial · 3.0mm · 0.94mm/px · z∈[-106,+35]mm · 8 of 100 slices shown (1 of 2)]
[im 1/100]
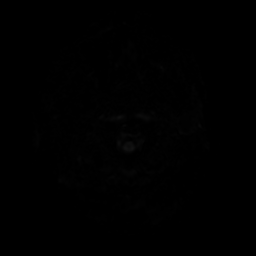
[im 12/100]
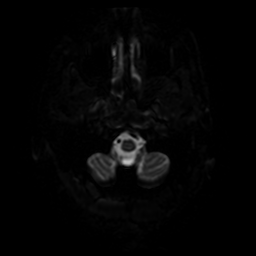
[im 34/100]
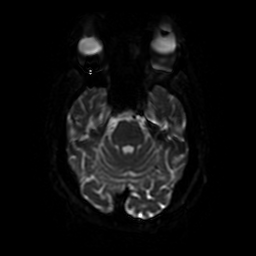
[im 45/100]
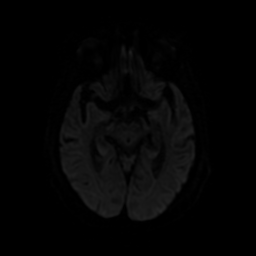
[im 56/100]
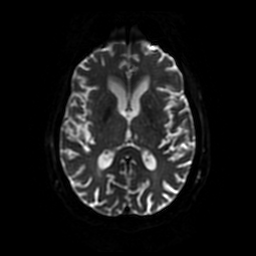
[im 67/100]
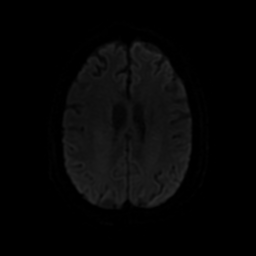
[im 89/100]
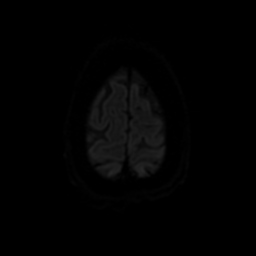
[im 100/100]
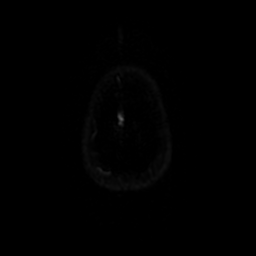

[Series 3: DWI · coronal · 4.0mm · 0.94mm/px · 7 of 74 slices shown (2 of 2)]
[im 1/74]
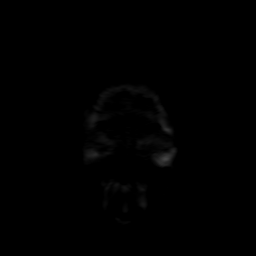
[im 13/74]
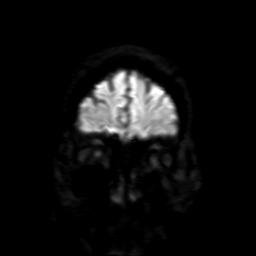
[im 25/74]
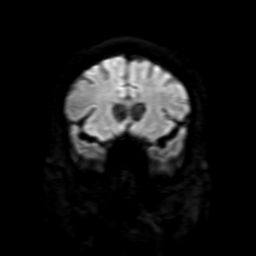
[im 37/74]
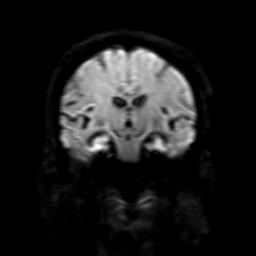
[im 49/74]
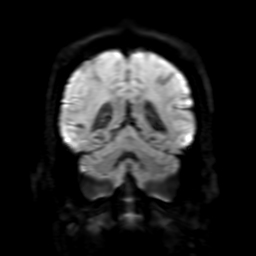
[im 61/74]
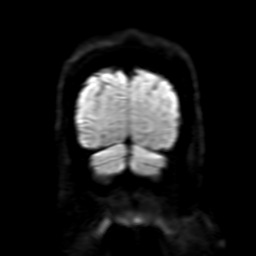
[im 74/74]
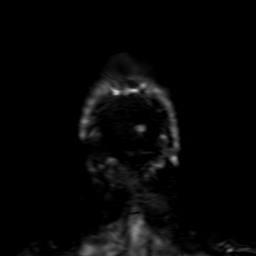

[Series 4: FLAIR · sagittal · 5.0mm · 0.23mm/px · 2 of 23 slices shown (1 of 2)]
[im 1/23]
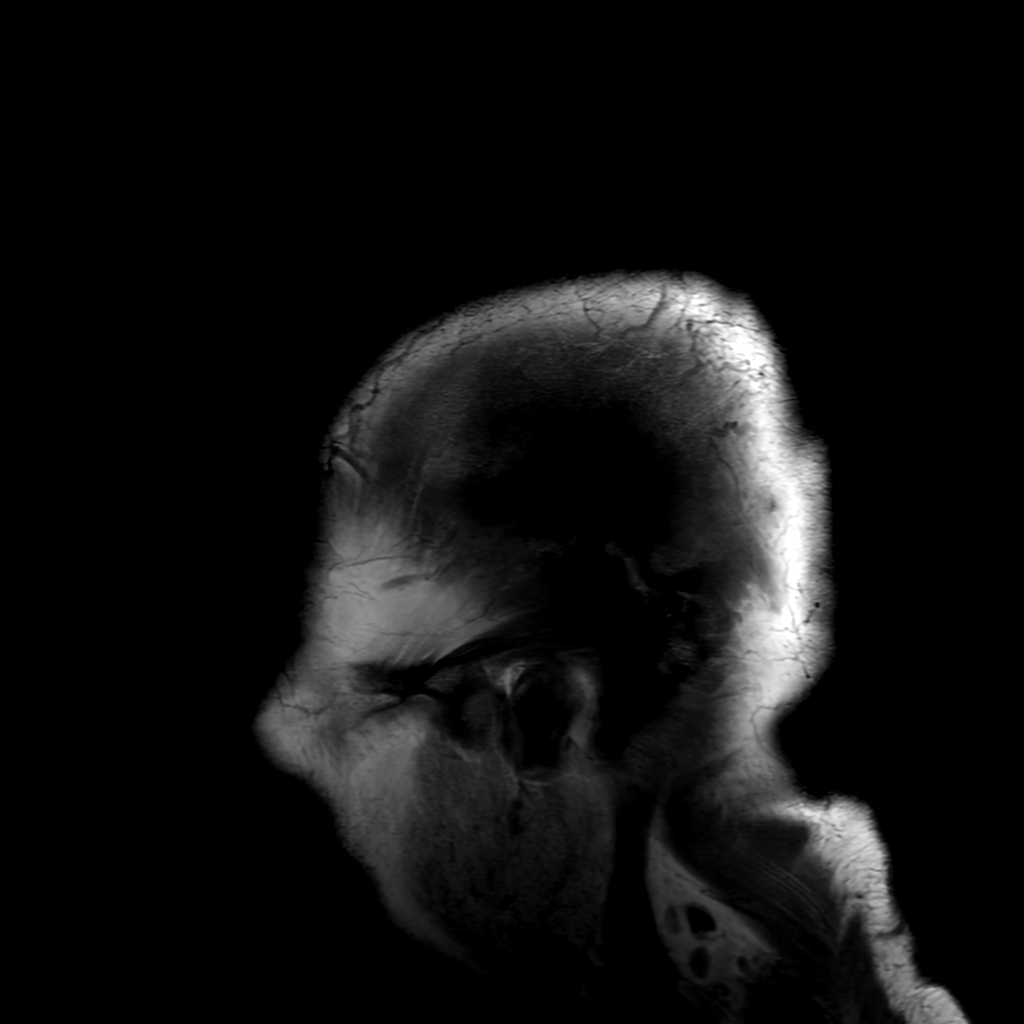
[im 23/23]
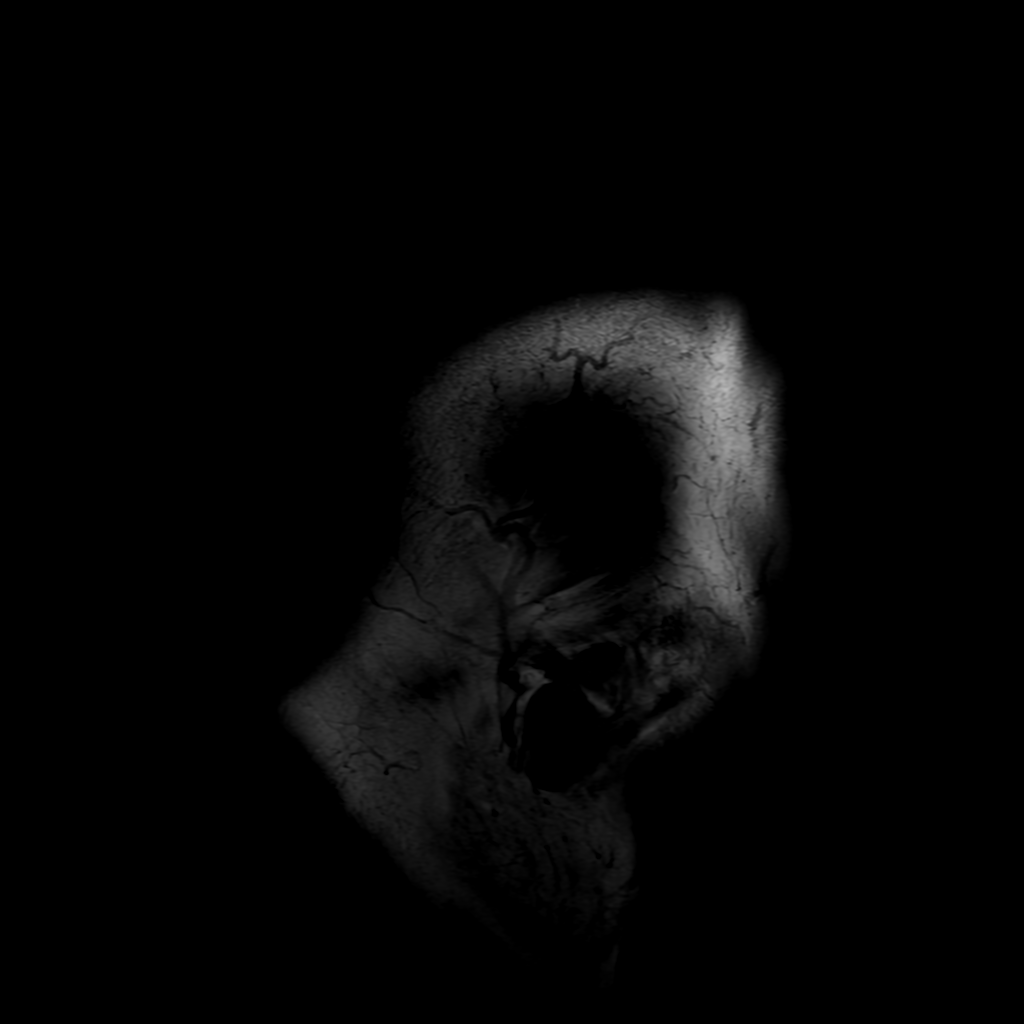

[Series 6: FLAIR · axial · 3.0mm · 0.43mm/px · z∈[-89,+53]mm · 2 of 25 slices shown (2 of 2)]
[im 1/25]
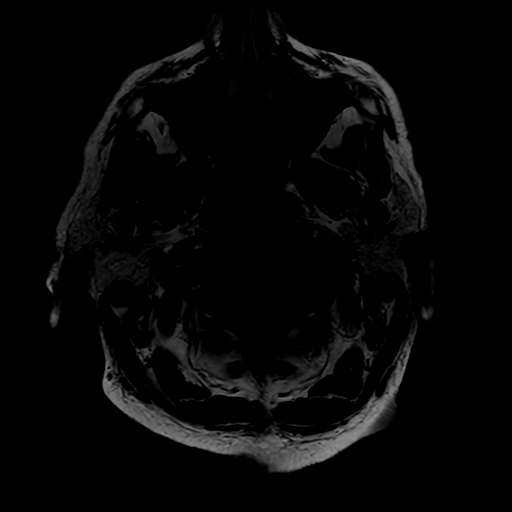
[im 25/25]
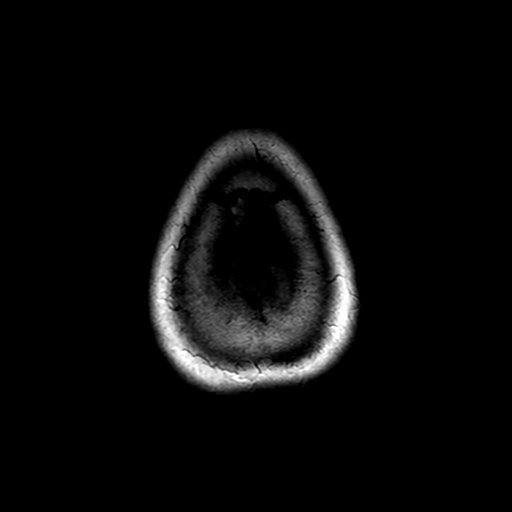

[Series 250: ADC · axial · 3.0mm · 0.94mm/px · z∈[-106,+35]mm · 5 of 50 slices shown (1 of 2)]
[im 1/50]
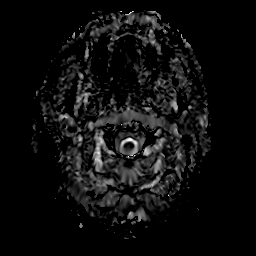
[im 13/50]
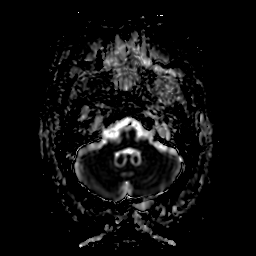
[im 25/50]
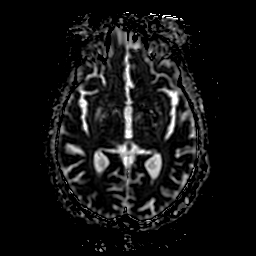
[im 37/50]
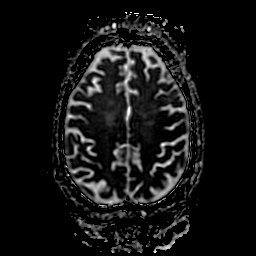
[im 50/50]
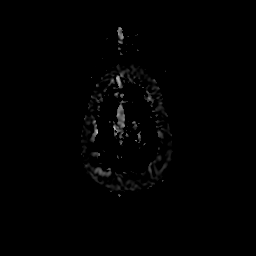

[Series 350: ADC · coronal · 4.0mm · 0.94mm/px · 3 of 37 slices shown (2 of 2)]
[im 1/37]
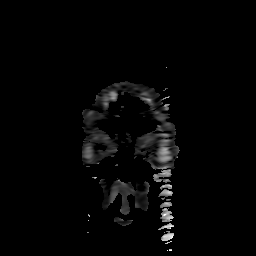
[im 19/37]
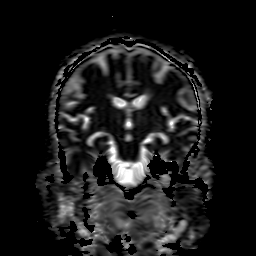
[im 37/37]
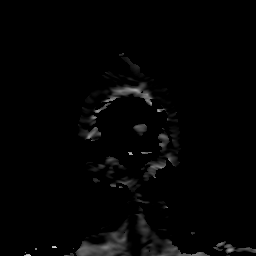

[27 of 48 positions shown; findings below may reference images not displayed]

FINDINGS: Brain: There is no acute infarction or intracranial hemorrhage.
There is no intracranial mass, mass effect, or edema. There is no
hydrocephalus or extra-axial fluid collection.

Prominence of the ventricles and sulci reflects mild generalized
parenchymal volume loss. Patchy and confluent areas of T2
hyperintensity in the supratentorial white matter are nonspecific
but may reflect moderate chronic microvascular ischemic changes.
There are chronic small vessel infarcts of the thalamus and right
pons. Several small foci of susceptibility hypointensity are present
likely reflecting chronic microhemorrhages also present on the prior
study.

Vascular: Major vessel flow voids at the skull base are preserved.

Skull and upper cervical spine: Normal marrow signal is preserved.

Sinuses/Orbits: Paranasal sinuses are aerated. Orbits are
unremarkable.

Other: Sella is unremarkable.  Mastoid air cells are clear.
IMPRESSION: No evidence of recent infarction, hemorrhage, mass. Stable chronic
findings detailed above.

## 2020-01-11 MED ORDER — PROPOFOL 1000 MG/100ML IV EMUL
0.0000 ug/kg/min | INTRAVENOUS | Status: DC
  Administered 2020-01-11 (×3): 40 ug/kg/min via INTRAVENOUS
  Administered 2020-01-11: 5 ug/kg/min via INTRAVENOUS
  Administered 2020-01-11 (×2): 50 ug/kg/min via INTRAVENOUS
  Administered 2020-01-12: 30 ug/kg/min via INTRAVENOUS
  Administered 2020-01-12: 27 ug/kg/min via INTRAVENOUS
  Administered 2020-01-12: 50 ug/kg/min via INTRAVENOUS
  Filled 2020-01-11 (×10): qty 100

## 2020-01-11 MED ORDER — MIDAZOLAM HCL 2 MG/2ML IJ SOLN: 1.0000 mg | INTRAMUSCULAR | Status: DC | PRN

## 2020-01-11 MED ORDER — PROSOURCE TF PO LIQD
45.0000 mL | Freq: Three times a day (TID) | ORAL | Status: DC
  Administered 2020-01-11 – 2020-01-13 (×6): 45 mL
  Filled 2020-01-11 (×5): qty 45

## 2020-01-11 MED ORDER — ATORVASTATIN CALCIUM 10 MG PO TABS
20.0000 mg | ORAL_TABLET | Freq: Every day | ORAL | Status: DC
  Administered 2020-01-11 – 2020-01-12 (×2): 20 mg
  Filled 2020-01-11 (×2): qty 2

## 2020-01-11 MED ORDER — RENA-VITE PO TABS
1.0000 | ORAL_TABLET | Freq: Every day | ORAL | Status: DC
  Administered 2020-01-11 – 2020-01-12 (×2): 1
  Filled 2020-01-11 (×2): qty 1

## 2020-01-11 MED ORDER — ROCURONIUM BROMIDE 50 MG/5ML IV SOLN: 50.0000 mg | Freq: Once | INTRAVENOUS | Status: AC

## 2020-01-11 MED ORDER — METOPROLOL TARTRATE 12.5 MG HALF TABLET: 25.0000 mg | ORAL_TABLET | Freq: Two times a day (BID) | ORAL | Status: DC

## 2020-01-11 MED ORDER — SODIUM CHLORIDE 0.9% FLUSH
10.0000 mL | Freq: Two times a day (BID) | INTRAVENOUS | Status: DC
  Administered 2020-01-11 – 2020-01-22 (×15): 10 mL

## 2020-01-11 MED ORDER — FENTANYL CITRATE (PF) 100 MCG/2ML IJ SOLN
25.0000 ug | INTRAMUSCULAR | Status: DC | PRN
  Administered 2020-01-12: 100 ug via INTRAVENOUS
  Filled 2020-01-11 (×2): qty 2

## 2020-01-11 MED ORDER — PANTOPRAZOLE SODIUM 40 MG IV SOLR
40.0000 mg | INTRAVENOUS | Status: DC
  Administered 2020-01-11 – 2020-01-14 (×4): 40 mg via INTRAVENOUS
  Filled 2020-01-11 (×4): qty 40

## 2020-01-11 MED ORDER — FENTANYL CITRATE (PF) 100 MCG/2ML IJ SOLN: 25.0000 ug | INTRAMUSCULAR | Status: DC | PRN

## 2020-01-11 MED ORDER — DOCUSATE SODIUM 50 MG/5ML PO LIQD
100.0000 mg | Freq: Two times a day (BID) | ORAL | Status: DC
  Filled 2020-01-11: qty 10

## 2020-01-11 MED ORDER — SODIUM CHLORIDE 0.9% FLUSH
10.0000 mL | INTRAVENOUS | Status: DC | PRN
  Administered 2020-01-15 – 2020-01-19 (×3): 10 mL

## 2020-01-11 MED ORDER — ACETAMINOPHEN 325 MG PO TABS: 650.0000 mg | ORAL_TABLET | Freq: Four times a day (QID) | ORAL | Status: DC | PRN

## 2020-01-11 MED ORDER — ETOMIDATE 2 MG/ML IV SOLN
20.0000 mg | Freq: Once | INTRAVENOUS | Status: AC
  Administered 2020-01-11: 20 mg via INTRAVENOUS

## 2020-01-11 MED ORDER — VITAL HIGH PROTEIN PO LIQD
1000.0000 mL | ORAL | Status: DC
  Administered 2020-01-11: 1000 mL

## 2020-01-11 MED ORDER — POLYETHYLENE GLYCOL 3350 17 G PO PACK
17.0000 g | PACK | Freq: Every day | ORAL | Status: DC
  Filled 2020-01-11: qty 1

## 2020-01-11 MED ORDER — MIDAZOLAM HCL 2 MG/2ML IJ SOLN
1.0000 mg | INTRAMUSCULAR | Status: DC | PRN
  Administered 2020-01-12 – 2020-01-13 (×3): 1 mg via INTRAVENOUS
  Filled 2020-01-11 (×3): qty 2

## 2020-01-11 MED ORDER — ROCURONIUM BROMIDE 10 MG/ML (PF) SYRINGE
PREFILLED_SYRINGE | INTRAVENOUS | Status: AC
  Administered 2020-01-11: 50 mg
  Filled 2020-01-11: qty 10

## 2020-01-11 MED ORDER — METOPROLOL TARTRATE 12.5 MG HALF TABLET
25.0000 mg | ORAL_TABLET | Freq: Two times a day (BID) | ORAL | Status: DC
  Filled 2020-01-11: qty 2

## 2020-01-11 MED ORDER — ORAL CARE MOUTH RINSE
15.0000 mL | OROMUCOSAL | Status: DC
  Administered 2020-01-11 – 2020-01-13 (×20): 15 mL via OROMUCOSAL

## 2020-01-11 MED ORDER — CHLORHEXIDINE GLUCONATE 0.12% ORAL RINSE (MEDLINE KIT)
15.0000 mL | Freq: Two times a day (BID) | OROMUCOSAL | Status: DC
  Administered 2020-01-11 – 2020-01-18 (×10): 15 mL via OROMUCOSAL

## 2020-01-11 MED ORDER — DOCUSATE SODIUM 50 MG/5ML PO LIQD: 100.0000 mg | Freq: Every day | ORAL | Status: DC | PRN

## 2020-01-11 NOTE — Significant Event (Signed)
Patient noted with sonorous respirations, no cough/gag, minimal movement with deep sternal rub. Decision made to proceed with intubation. Family will be notified. Patient to go for STAT head CT.

## 2020-01-11 NOTE — Progress Notes (Signed)
Patient was transported to MRI & back to 2M02 without any complications.

## 2020-01-11 NOTE — Progress Notes (Signed)
PT Cancellation Note  Patient Details Name: [name]. MRN: [id number] DOB: 10-Oct-1950   Cancelled Treatment:    Reason Eval/Treat Not Completed: Patient not medically ready.  Re-intubated and sedated. 01/11/2020  Ginger Carne., PT Acute Rehabilitation Services [phone]  (pager) [phone]  (office)   Tessie Fass Dyshawn Cangelosi 01/11/2020, 3:18 PM

## 2020-01-11 NOTE — Progress Notes (Signed)
Pt transported from 2M02 to CT and back with no complications.

## 2020-01-11 NOTE — Progress Notes (Addendum)
Initial Nutrition Assessment  DOCUMENTATION CODES:   Not applicable  INTERVENTION:   Initiate tube feeding via OG tube: Vital High Protein at 45 ml/h (1080 ml per day) Prosource TF 45 ml TID  Provides 1200 kcal (1918 kcal total with propofol), 128 gm protein, 903 ml free water daily  Monitor magnesium, potassium, and phosphorus, MD to replete as needed, as pt is at risk for refeeding syndrome given minimal intake since admission x 7 days.   NUTRITION DIAGNOSIS:   Inadequate oral intake related to inability to eat as evidenced by NPO status.  GOAL:   Patient will meet greater than or equal to 90% of their needs  MONITOR:   Vent status, Labs, Skin, TF tolerance, I & O's  REASON FOR ASSESSMENT:   Ventilator, Consult Enteral/tube feeding initiation and management  ASSESSMENT:   69 yo male admitted with cervical epidural abscess with MRSA bacteremia. S/P laminectomy and fusion on 8/5. PMH includes ESRD on HD, HTN, HLD, OSA, DM2, smoker, cocaine and alcohol use.   Patient was transferred to the ICU 8/9 and required intubation overnight.  Prior to this he was on a renal diet, meal intakes 0-45% since admission. Received MD Consult for TF initiation and management. OG tube in place.  Patient is currently intubated on ventilator support MV: 7 L/min Temp (24hrs), Avg:99 F (37.2 C), Min:98.1 F (36.7 C), Max:99.5 F (37.5 C)  Propofol: 27.2 ml/hr providing 718 kcal from lipid  Labs reviewed. Ionized calcium 0.92 (L), phos 6.9 (H), creat 6.1 (H), BUN 50 (H) CBG: 121-116  Medications reviewed and include novolog, levemir, rena-vit, propofol.  Patient weighed 113.9 kg on 01/08/2019, weight seems to be gradually trending down, likely at least partially related to fluid shifts with hx of ESRD on HD.   EDW 99 kg PTA per Nephrology note, currently below EDW.  Admission weight 90 kg, currently 90.3 kg Weigh wss documented at 81.6 kg on 01/03/20.  NUTRITION - FOCUSED PHYSICAL  EXAM:  unable to complete  Diet Order:   Diet Order            Diet NPO time specified  Diet effective now                 EDUCATION NEEDS:   Not appropriate for education at this time  Skin:  Skin Assessment: Reviewed RN Assessment  Last BM:  8/10 type 7  Height:   Ht Readings from Last 1 Encounters:  01/06/20 5' 11.5" (1.816 m)    Weight:   Wt Readings from Last 1 Encounters:  01/11/20 90.3 kg    Ideal Body Weight:  79.5 kg  BMI:  Body mass index is 27.38 kg/m.  Estimated Nutritional Needs:   Kcal:  1900  Protein:  125-135 gm  Fluid:  1 L + UOP    Lucas Mallow, RD, LDN, CNSC Please refer to Amion for contact information.

## 2020-01-11 NOTE — Progress Notes (Signed)
[facility] Progress Note  Subjective: [name] was taken to ICU where required intubation and transient pressor support overnight. Got rest of HD in ICU overnight and labs improved today. CXR post intubation was negative.   Vitals:   01/11/20 0500 01/11/20 0600 01/11/20 0700 01/11/20 0715  BP: 136/62 131/62 (!) 125/59 111/60  Pulse: 85 85 82 83  Resp: '14 14 14 14  ' Temp:   99 F (37.2 C)   TempSrc:   Axillary   SpO2: 100% 100% 100% 100%  Weight: 90.3 kg     Height:        Physical Exam General: on vent, sedated Heart: RRR; no murmur Lungs: CTA anteriorly Abdomen: soft, non-tender Extremities: no edema Neuro: sedated on the vent Dialysis Access: L AVF w/ infiltration, +bruit R IJ temp HD cath in place  Dialysis: [facility] MWF  4h 22mn  99kg  2/2 bath  RIJ TDC (removed)/ LUA AVF Hep none - Calcitriol 1.216m PO q HD - Mircera 150 mcg IV q 2 weeks (last given on 12/29/19)  Assessment/Plan: 1. Epidural abscess:  MRI showed abscess extending from C4-T2 with cord compression, seen by NSurg and went to OR for C4-T2 for decomp/ laminectomy/ fusion on 01/06/20 with improved pain.  2. MRSA bacteremia: Repeat BCx 8/6 negative. TTE 8/6 without vegetation. TDC removed 8/6, using LUE AVF until infiltrated yest 8/9. On Vancomycin. ID following. 3. Confusion: meds v. infection v. Uremia. Good HD yest and B/Cr down today. However is sedated on vent now so difficult to read mental status.  4. ESRD: HD on MWF schedule. BUN > 100 yest, down today 60's after HD last night. AVF infiltrated, using temp cath for now. Plan next HD in ICU Wednesday.  5. HTN/volume: BP's wnl, looks dry on exam, 8kg under prior dry wt. No fluid off next HD.  6. Anemia: Hgb 9.3 - due for ESA later this week. 7. Secondary hyperparathyroidism:  Phos high - started on Renvela 160024mID on 8/8 - follow. 8. Nutrition: Alb very low - continue supplements as tolRonnie DerbyD 01/11/2020, 7:57  AM       Objective Vitals:   01/11/20 0500 01/11/20 0600 01/11/20 0700 01/11/20 0715  BP: 136/62 131/62 (!) 125/59 111/60  Pulse: 85 85 82 83  Resp: '14 14 14 14  ' Temp:   99 F (37.2 C)   TempSrc:   Axillary   SpO2: 100% 100% 100% 100%  Weight: 90.3 kg     Height:              [name] 01/11/2020, 7:49 AM   Recent Labs  Lab 01/10/20 0230 01/10/20 0230 01/11/20 0002 01/11/20 0002 01/11/20 0202 01/11/20 0205  K 4.5   < > 4.5   < > 4.3 4.3  BUN 119*  --   --   --  50*  --   CREATININE 12.16*  --   --   --  6.11*  --   CALCIUM 7.8*  --   --   --  7.8*  --   PHOS 9.6*  --   --   --  6.9*  --   HGB 9.3*   < > 9.2*  --   --  8.2*   < > = values in this interval not displayed.   Inpatient medications: . (feeding supplement) PROSource Plus  30 mL Oral BID BM  . amLODipine  10 mg Oral QHS  . atorvastatin  20 mg Oral QHS  .  chlorhexidine gluconate (MEDLINE KIT)  15 mL Mouth Rinse BID  . Chlorhexidine Gluconate Cloth  6 each Topical Q0600  . Chlorhexidine Gluconate Cloth  6 each Topical Q0600  . docusate  100 mg Oral BID  . insulin aspart  0-6 Units Subcutaneous Q4H  . insulin detemir  20 Units Subcutaneous QHS  . mouth rinse  15 mL Mouth Rinse 10 times per day  . metoprolol tartrate  50 mg Oral BID  . multivitamin  1 tablet Oral QHS  . polyethylene glycol  17 g Oral Daily  . sodium chloride flush  10-40 mL Intracatheter Q12H   . sodium chloride 10 mL/hr at 01/10/20 1800  . phenylephrine    . phenylephrine (NEO-SYNEPHRINE) Adult infusion    . propofol (DIPRIVAN) infusion 40 mcg/kg/min (01/11/20 0625)  . rifampin (RIFADIN) IVPB Stopped (01/10/20 2342)  . vancomycin Stopped (01/10/20 1333)   acetaminophen, fentaNYL (SUBLIMAZE) injection, fentaNYL (SUBLIMAZE) injection, hydrALAZINE, midazolam, midazolam, ondansetron **OR** ondansetron (ZOFRAN) IV, polyethylene glycol, senna-docusate, sodium chloride flush

## 2020-01-11 NOTE — Progress Notes (Signed)
ETT pulled back 2 cm per MD order. ETT secured at 24 a the lips.  Pt tolerating well.

## 2020-01-11 NOTE — Progress Notes (Signed)
Pharmacy Antibiotic Note  Rodney Mamoru Takeshita. is a 69 y.o. male admitted on 01/04/2020 with MRSA bacteremia/epidural abscess of C spine. Pharmacy has been consulted for vancomycin dosing. Patient is ESRD with HD on MWF.   [name] went to surgery 8/5 for a laminectomy and fusion.  HD is scheduled MWF.   Vanc random = 22, which is at goal 15-25. Will keep dose and schedule of vanc dosing after HD the same.   Plan: - Continue vancomycin 750mg  IV MWF after HD - Draw vanc level as needed - Follow up LOT   Height: 5' 11.5" (181.6 cm) Weight: 90.3 kg (199 lb 1.2 oz) IBW/kg (Calculated) : 76.45  Temp (24hrs), Avg:98.8 F (37.1 C), Min:97.3 F (36.3 C), Max:99.5 F (37.5 C)  Recent Labs  Lab 01/04/20 1400 01/05/20 0502 01/06/20 0258 01/06/20 0258 01/07/20 0720 01/08/20 0210 01/09/20 0307 01/09/20 0451 01/10/20 0230 01/11/20 0202  WBC  --    < > 25.8*  --  16.8* 15.4*  --  18.8* 19.9*  --   CREATININE  --    < > 12.57*   < > 14.21* 9.11* 10.85*  --  12.16* 6.11*  LATICACIDVEN 1.7  --   --   --   --   --   --   --   --   --   VANCORANDOM  --   --   --   --  23  --   --   --   --   --    < > = values in this interval not displayed.    Estimated Creatinine Clearance: 12.5 mL/min (A) (by C-G formula based on SCr of 6.11 mg/dL (H)).    No Known Allergies  Antimicrobials this admission: vancomycin 8/4 >>   Dose adjustments this admission:  Microbiology results: 8/3 BCx: 4/4 MRSA 8/3 UCx: No growth  8/5 Wound: MRSA 8/6 Repeat BCx: No growth <12 h  Thank you for allowing pharmacy to be a part of this patient's care.  Mercy Riding, PharmD PGY1 Acute Care Pharmacy Resident Please refer to [facility] for unit-specific pharmacist

## 2020-01-11 NOTE — Progress Notes (Signed)
NAME:  [name]., MRN:  [id number], DOB:  Apr 09, 1951, LOS: 7 ADMISSION DATE:  01/04/2020, CONSULTATION DATE:  01/10/2020 REFERRING MD:  Jonnie Finner, CHIEF COMPLAINT:  AMS   Brief History   69 year old male with PMHx of ESRD admitted on 8/3 with altered mental status found to have large cervicothoracic dorsal cervical epidural abscess. Underwent laminectomy and drainage with improved mental status. However, noted to have acutely worsened mental status prior to initiation of HD on 8/9 for which PCCM consulted.   Past Medical History   Past Medical History:  Diagnosis Date  . Anemia   . Arthritis    left knee  . Chronic kidney disease    acute renal failure/injury requiring short-term HD 2013  . Colon cancer ([place])   . Colon polyp    11/2011 - Polyps identified, biopsy - invasive adenocarinoma  . DM II (diabetes mellitus, type II), controlled ([place])    type 2 IDDM x 15 years. A1C 1/09 13.7.   Marland Kitchen 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 12/25/2011  . GERD (gastroesophageal reflux disease)   . Heart murmur   . 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 Hyperlipidemia   . Hypertension    for a few years and resolved in 2013/2014.  . Sleep apnea    does not wear CPAP now  . Wears glasses    Significant Hospital Events   8/3> admitted 8/5> cervical and thoracic laminectomy 8/9 > PCCM consulted for AMS and patient transferred to ICU  8/10 > intubated for worsening mental and respiratory status   Consults:  PCCM ID Nephrology Neurosurgery  Procedures:  Cervical and thoracic laminectomy 8/5 R IJ HD catheter 8/9>  ETT 8/10>   Significant Diagnostic Tests:  8/3 MRI c spine: 1. Abnormal fluid in the dorsal spinal canal extending from foramen magnum to the T2 level. Given the other inflammatory changes and concern, this most likely represents epidural infection. There is some fluid ventral to the spinal cord at C2. 2. Diffuse prevertebral edema from the clivus to C6 is  also concerning for infection. 3. The cord is compressed without abnormal signal suggesting edema. 4. Mild edema in the posterior paraspinous muscles, left greater than right. Edema is most significant within the paraspinous muscles just below the occiput. No focal abscess is present. 5. Severe disc disease at C5-6 and C6-7 with moderate central canal stenosis at both levels. 6. Mild disc disease at C3-4 and C4-5 with some cord compression at these levels. 7. Abnormal marrow signal in the lower cervical spine, particularly C7 and T1. This is nonspecific and can be seen in the setting of anemia, smoking, or obesity. 8/4 MRI thoracic: 1. Dorsal epidural fluid collection extending from the cervical spine to T2 level, consistent with epidural abscess. No continuation below the T2 level. 2. No thoracic spinal canal or neural foraminal stenosis. 8/5 MRI lumbar: 1. There is what appears to be a subdural fluid collection at the posterior left quadrant extending from T12-L1 to L2-3, with maximal diameter on the axial images 7 x 11 mm in cross-sectional diameter. This causes mass effect upon the neural structures, crowding them anteriorly. Additionally, there is probably some epidural material posteriorly at L3 to L4. This could be a small subdural component of this as well. This could be infected material. 2. 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 There is 2 mm of anterolisthesis at L2-3, L3-4 and L4-5. There is disc degeneration with bulging of the discs.  There is moderate to severe multifactorial spinal stenosis at L3-4 and severe spinal stenosis at L4-5 that could cause neural compression. Facet joint edema at L2-3, L3-4 and L4-5 could be degenerative or potentially relate to infection. The single most suspicious looking facet joint is on the right at L3-4. Additionally, the posterior para spinous soft tissue structures show edematous change  which could be inflammatory. 8/6 echo: LVEF 67%, grade 2 diastolic dysfunction, trival MR and TR 8/10 CT Head: IMPRESSION: 1. No acute intracranial abnormality. Stable thalamic small vessel disease. 2. Continued abnormal upper cervical epidural space. Partially visible cervical posterior midline postoperative changes.  Micro Data:  8/3 blood: MRSA 8/3 urine: neg 8/5 intraop: MRSA 8/6 blood: negative 8/9 MRSA negative   Antimicrobials:  Vancomycin 8/4>8/6, 8/9> Rifampin 8/9>   Interim history/subjective:  Overnight, required intubation for sonorous respirations, absence of cough/gag reflex, and unresponsive.  Objective   Blood pressure 111/60, pulse 83, temperature 99 F (37.2 C), temperature source Axillary, resp. rate 14, height 5' 11.5" (1.816 m), weight 90.3 kg, SpO2 100 %.    Vent Mode: PRVC FiO2 (%):  [40 %-100 %] 40 % Set Rate:  [14 bmp-18 bmp] 14 bmp Vt Set:  [610 mL] 610 mL PEEP:  [5 cmH20] 5 cmH20 Plateau Pressure:  [14 cmH20-15 cmH20] 15 cmH20   Intake/Output Summary (Last 24 hours) at 01/11/2020 0726 Last data filed at 01/11/2020 0600 Gross per 24 hour  Intake 1842.75 ml  Output 3184 ml  Net -1341.25 ml   Filed Weights   01/10/20 0742 01/10/20 2020 01/11/20 0500  Weight: 90.8 kg 90.8 kg 90.3 kg    Examination: General: critically ill appearing male, intubated and mechanically ventilated HENT: [place]/AT, PERRL, ET tube in place Lungs: clear to auscultation bilaterally Cardiovascular: RRR, S1 and S2 present, no m/r/g Abdomen: soft, nondistended, nontender, normoactive bowel sounds Extremities: no pitting edema noted in bilateral lower extremities Neuro: intermittently following commands, PERRL, no apparent focal deficits noted  Assessment & Plan:  Acute hypoxic respiratory failure  Overnight, patient with sonorous respirations without gag or cough reflex and minimally responsive. Patient intubated for concerns of airway protection - Suspect respiratory  failure secondary to his acute encephalopathy; although unclear why he continues to be encephalopathic  - Continue mechanical ventilation for TV 4-8 cc/kg - VAP per protocol  Acute metabolic toxic encephalopathy: Presumed to be infectious in nature in setting of MRSA bacteremia; however, repeat cultures negative and patient on vancomycin; CT head without acute intracranial abnormalities  Does have history of ESRD so possibly uremic - Will obtain MRI  - Wean sedation as tolerated for RASS 0 to -1   MRSA bacteremia  Epidural abscess s/p laminectomy: - Continued on vancomycin  - Rifampin added for additional Staph CNS coverage   Metabolic alkalosis: >8L of NG tube output over past 24 hours  - Suspect secondary to GI losses  - Continue to monitor   ESRD on HD: - HD per nephrology  Anemia of chronic disease: - Currently at baseline  - ESA per nephrology   Type 2 DM with hyperglycemia Goal CBG <180 - Continue SSI   HFpEF without acute decompensation   Best practice:  Diet: NPO, hold tube feeds to decrease NGT output Pain/Anxiety/Delirium protocol (if indicated): per protocol  VAP protocol (if indicated): per protocol  DVT prophylaxis: SCDs GI prophylaxis: PPI Glucose control: SSI Mobility: bed rest Code Status: FULL  Family Communication: family updated at bedside  Disposition: ICU  Critical care time: 40 minutes  Harvie Heck, MD Internal Medicine, PGY-2 01/11/20 12:37 PM Pager # [phone]

## 2020-01-11 NOTE — Procedures (Signed)
Intubation Procedure Note  Jaquille Kau  [id number]  1951-02-02  Date:01/11/20  Time:12:18 AM   Provider Performing:Costella Schwarz Jerilynn Mages Dewaine Oats    Procedure: Intubation (33825)  Indication(s) Respiratory Failure  Consent Unable to obtain consent due to emergent nature of procedure.   Anesthesia Etomidate and Rocuronium   Time Out Verified patient identification, verified procedure, site/side was marked, verified correct patient position, special equipment/implants available, medications/allergies/relevant history reviewed, required imaging and test results available.   Sterile Technique Usual hand hygeine, masks, and gloves were used   Procedure Description Patient positioned in bed supine.  Sedation given as noted above.  Patient was intubated with endotracheal tube using Glidescope.  View was Grade 1 full glottis .  Number of attempts was 1.  Colorimetric CO2 detector was consistent with tracheal placement.   Complications/Tolerance None; patient tolerated the procedure well. Chest X-ray is ordered to verify placement.   EBL None.    Specimen(s) None

## 2020-01-11 NOTE — Progress Notes (Signed)
eLink Physician-Brief Progress Note Patient Name: [name]. DOB: 1951/03/14 MRN: [id number]   Date of Service  01/11/2020  HPI/Events of Note  Head CT Scan reveals: 1. No acute intracranial abnormality. Stable thalamic small vessel disease. 2. Continued abnormal upper cervical epidural space. Partially visible cervical posterior midline postoperative changes. CXR reading says that ETT tip is 8 mm above the carina, however, it is very difficult to see on CXR. ABG on. ABG on 40%/PRVC 14/TV 610/P 5 = 7.59/29.0/234/27.9. Patient was breathing over set ventilator rate at the time of the ABG.   eICU Interventions  Plan:  1. Repeat ABG at 7:30 AM when the patient is better sedated.  2. Withdraw ETT 2 cm and repeat CXR for ETT position.      Intervention Category Major Interventions: Other:  Alexsys Eskin Cornelia Copa 01/11/2020, 3:21 AM

## 2020-01-12 ENCOUNTER — Inpatient Hospital Stay (HOSPITAL_COMMUNITY): Payer: No Typology Code available for payment source

## 2020-01-12 DIAGNOSIS — G062 Extradural and subdural abscess, unspecified: Secondary | ICD-10-CM

## 2020-01-12 DIAGNOSIS — J9601 Acute respiratory failure with hypoxia: Secondary | ICD-10-CM | POA: Diagnosis not present

## 2020-01-12 DIAGNOSIS — R7881 Bacteremia: Secondary | ICD-10-CM

## 2020-01-12 DIAGNOSIS — B9562 Methicillin resistant Staphylococcus aureus infection as the cause of diseases classified elsewhere: Secondary | ICD-10-CM

## 2020-01-12 DIAGNOSIS — G9340 Encephalopathy, unspecified: Secondary | ICD-10-CM | POA: Diagnosis not present

## 2020-01-12 LAB — BASIC METABOLIC PANEL
Anion gap: 11 (ref 5–15)
BUN: 48 mg/dL — ABNORMAL HIGH (ref 8–23)
CO2: 29 mmol/L (ref 22–32)
Calcium: 7.5 mg/dL — ABNORMAL LOW (ref 8.9–10.3)
Chloride: 94 mmol/L — ABNORMAL LOW (ref 98–111)
Creatinine, Ser: 5.25 mg/dL — ABNORMAL HIGH (ref 0.61–1.24)
GFR calc Af Amer: 12 mL/min — ABNORMAL LOW (ref >60)
GFR calc non Af Amer: 10 mL/min — ABNORMAL LOW (ref >60)
Glucose, Bld: 167 mg/dL — ABNORMAL HIGH (ref 70–99)
Potassium: 3.8 mmol/L (ref 3.5–5.1)
Sodium: 134 mmol/L — ABNORMAL LOW (ref 135–145)

## 2020-01-12 LAB — POCT I-STAT 7, (LYTES, BLD GAS, ICA,H+H)
Acid-Base Excess: 9 mmol/L — ABNORMAL HIGH (ref 0.0–2.0)
Acid-Base Excess: 9 mmol/L — ABNORMAL HIGH (ref 0.0–2.0)
Bicarbonate: 33.1 mmol/L — ABNORMAL HIGH (ref 20.0–28.0)
Bicarbonate: 32.5 mmol/L — ABNORMAL HIGH (ref 20.0–28.0)
Calcium, Ion: 0.85 mmol/L — CL (ref 1.15–1.40)
Calcium, Ion: 0.96 mmol/L — ABNORMAL LOW (ref 1.15–1.40)
HCT: 26 % — ABNORMAL LOW (ref 39.0–52.0)
HCT: 27 % — ABNORMAL LOW (ref 39.0–52.0)
Hemoglobin: 8.8 g/dL — ABNORMAL LOW (ref 13.0–17.0)
Hemoglobin: 9.2 g/dL — ABNORMAL LOW (ref 13.0–17.0)
O2 Saturation: 99 %
O2 Saturation: 99 %
Patient temperature: 97.8
Patient temperature: 97.5
Potassium: 4 mmol/L (ref 3.5–5.1)
Potassium: 3.1 mmol/L — ABNORMAL LOW (ref 3.5–5.1)
Sodium: 137 mmol/L (ref 135–145)
Sodium: 137 mmol/L (ref 135–145)
TCO2: 34 mmol/L — ABNORMAL HIGH (ref 22–32)
TCO2: 34 mmol/L — ABNORMAL HIGH (ref 22–32)
pCO2 arterial: 40.2 mmHg (ref 32.0–48.0)
pCO2 arterial: 38.3 mmHg (ref 32.0–48.0)
pH, Arterial: 7.521 — ABNORMAL HIGH (ref 7.350–7.450)
pH, Arterial: 7.536 — ABNORMAL HIGH (ref 7.350–7.450)
pO2, Arterial: 123 mmHg — ABNORMAL HIGH (ref 83.0–108.0)
pO2, Arterial: 125 mmHg — ABNORMAL HIGH (ref 83.0–108.0)

## 2020-01-12 LAB — RENAL FUNCTION PANEL
Albumin: 2 g/dL — ABNORMAL LOW (ref 3.5–5.0)
Anion gap: 17 — ABNORMAL HIGH (ref 5–15)
BUN: 92 mg/dL — ABNORMAL HIGH (ref 8–23)
CO2: 29 mmol/L (ref 22–32)
Calcium: 7.5 mg/dL — ABNORMAL LOW (ref 8.9–10.3)
Chloride: 91 mmol/L — ABNORMAL LOW (ref 98–111)
Creatinine, Ser: 9.15 mg/dL — ABNORMAL HIGH (ref 0.61–1.24)
GFR calc Af Amer: 6 mL/min — ABNORMAL LOW (ref >60)
GFR calc non Af Amer: 5 mL/min — ABNORMAL LOW (ref >60)
Glucose, Bld: 314 mg/dL — ABNORMAL HIGH (ref 70–99)
Phosphorus: 9.7 mg/dL — ABNORMAL HIGH (ref 2.5–4.6)
Potassium: 4.1 mmol/L (ref 3.5–5.1)
Sodium: 137 mmol/L (ref 135–145)

## 2020-01-12 LAB — CULTURE, BLOOD (ROUTINE X 2)
Culture: NO GROWTH
Culture: NO GROWTH
Culture: NO GROWTH
Culture: NO GROWTH
Special Requests: ADEQUATE
Special Requests: ADEQUATE

## 2020-01-12 LAB — CBC
HCT: 24 % — ABNORMAL LOW (ref 39.0–52.0)
Hemoglobin: 8 g/dL — ABNORMAL LOW (ref 13.0–17.0)
MCH: 26.3 pg (ref 26.0–34.0)
MCHC: 33.3 g/dL (ref 30.0–36.0)
MCV: 78.9 fL — ABNORMAL LOW (ref 80.0–100.0)
Platelets: 343 10*3/uL (ref 150–400)
RBC: 3.04 MIL/uL — ABNORMAL LOW (ref 4.22–5.81)
RDW: 17.5 % — ABNORMAL HIGH (ref 11.5–15.5)
WBC: 20.5 10*3/uL — ABNORMAL HIGH (ref 4.0–10.5)
nRBC: 0 % (ref 0.0–0.2)

## 2020-01-12 LAB — GLUCOSE, CAPILLARY
Glucose-Capillary: 311 mg/dL — ABNORMAL HIGH (ref 70–99)
Glucose-Capillary: 289 mg/dL — ABNORMAL HIGH (ref 70–99)
Glucose-Capillary: 178 mg/dL — ABNORMAL HIGH (ref 70–99)
Glucose-Capillary: 165 mg/dL — ABNORMAL HIGH (ref 70–99)
Glucose-Capillary: 231 mg/dL — ABNORMAL HIGH (ref 70–99)
Glucose-Capillary: 164 mg/dL — ABNORMAL HIGH (ref 70–99)
Glucose-Capillary: 94 mg/dL (ref 70–99)

## 2020-01-12 LAB — TRIGLYCERIDES: Triglycerides: 258 mg/dL — ABNORMAL HIGH (ref <150)

## 2020-01-12 LAB — HEMOGLOBIN A1C
Hgb A1c MFr Bld: 6.5 % — ABNORMAL HIGH (ref 4.8–5.6)
Mean Plasma Glucose: 139.85 mg/dL

## 2020-01-12 IMAGING — DX DG CHEST 1V PORT
1 series · 1 of 1 positions shown · non-contrast
Comparison: The chest x-ray [DATE]

CLINICAL DATA: Respiratory failure.

EXAM:
PORTABLE CHEST 1 VIEW

[chest]
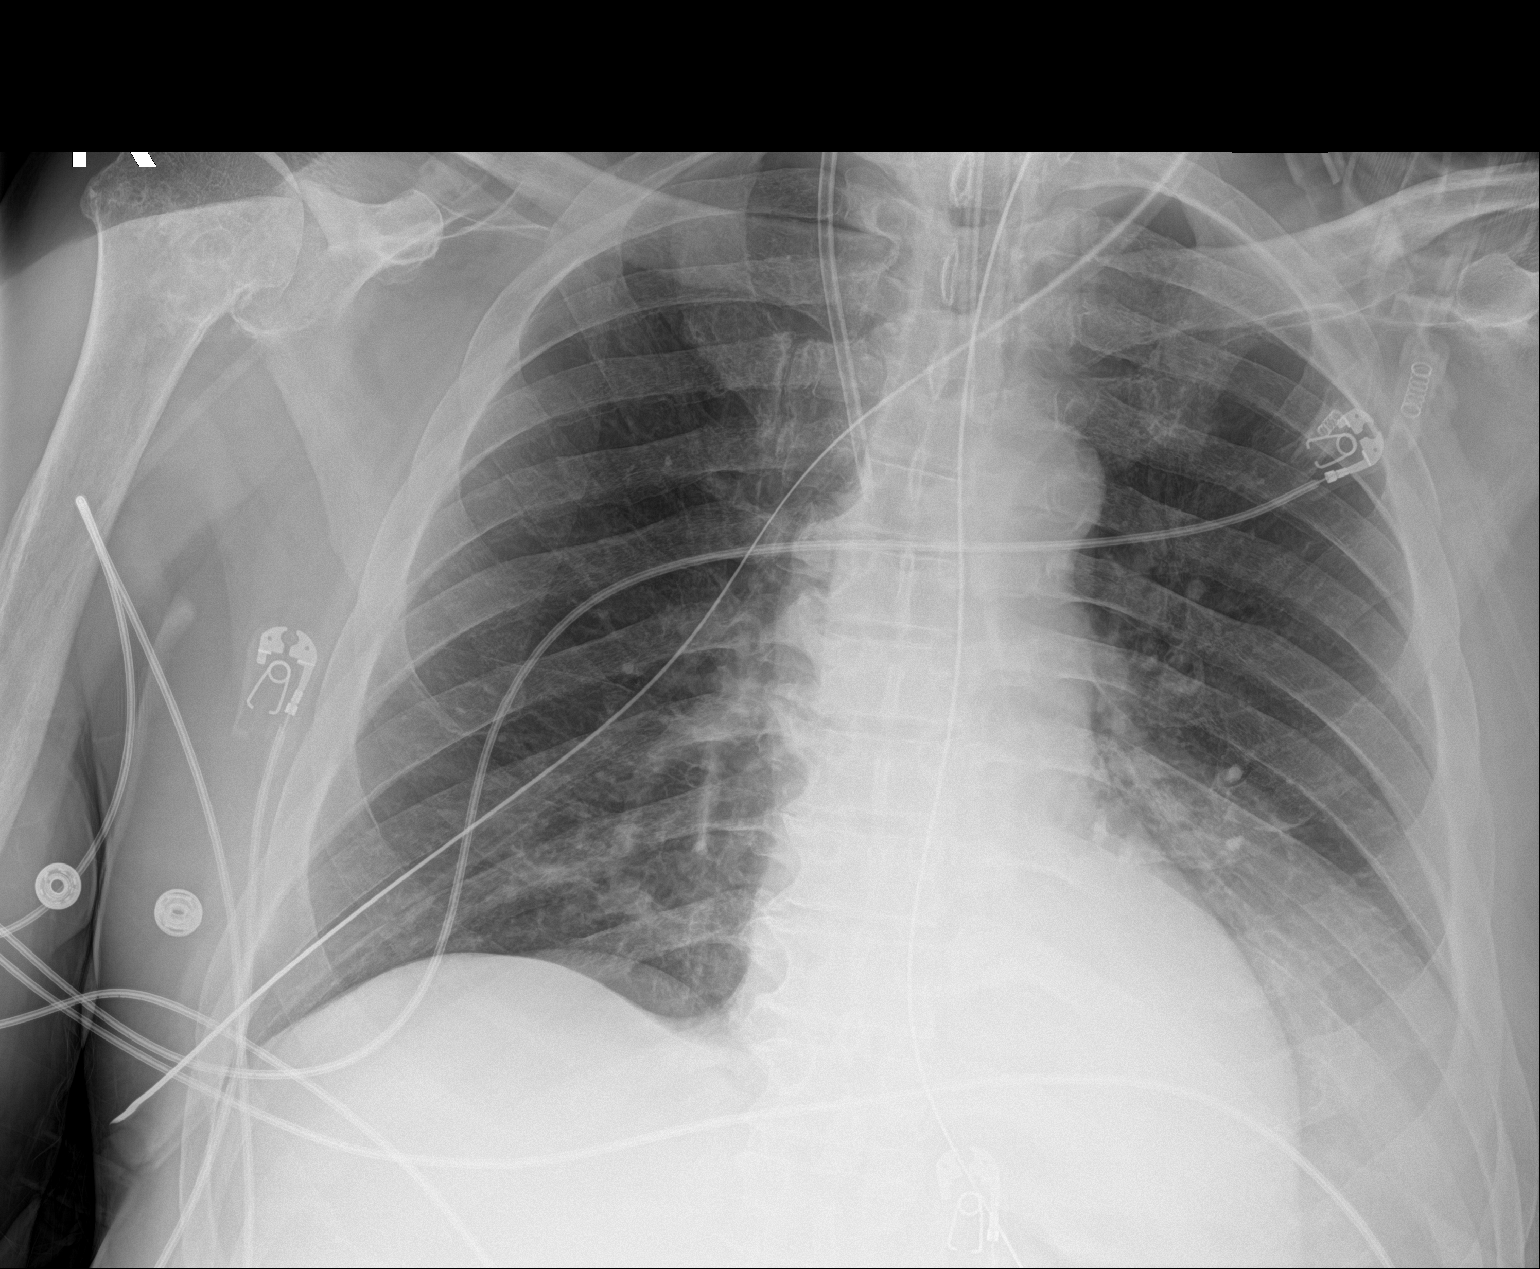

[1 of 1 positions shown; findings below may reference images not displayed]

FINDINGS: The heart size is normal. Endotracheal tube terminates 5.5 cm above
the carina. Enteric tube courses off the inferior border the film.
Right IJ line is stable. Left lower lobe airspace opacity is stable.
No new airspace disease is present.
IMPRESSION: 1. Stable left lower lobe airspace disease.
2. Support apparatus is stable.

## 2020-01-12 MED ORDER — DEXMEDETOMIDINE HCL IN NACL 400 MCG/100ML IV SOLN
0.4000 ug/kg/h | INTRAVENOUS | Status: DC
  Administered 2020-01-12: 0.5 ug/kg/h via INTRAVENOUS
  Administered 2020-01-13: 0.6 ug/kg/h via INTRAVENOUS
  Filled 2020-01-12 (×3): qty 100

## 2020-01-12 MED ORDER — VANCOMYCIN HCL IN DEXTROSE 750-5 MG/150ML-% IV SOLN
INTRAVENOUS | Status: AC
  Administered 2020-01-12: 750 mg via INTRAVENOUS
  Filled 2020-01-12: qty 150

## 2020-01-12 MED ORDER — INSULIN DETEMIR 100 UNIT/ML ~~LOC~~ SOLN
23.0000 [IU] | Freq: Every day | SUBCUTANEOUS | Status: DC
  Filled 2020-01-12 (×2): qty 0.23

## 2020-01-12 MED ORDER — POTASSIUM CHLORIDE 10 MEQ/100ML IV SOLN
10.0000 meq | INTRAVENOUS | Status: AC
  Administered 2020-01-12 (×2): 10 meq via INTRAVENOUS
  Filled 2020-01-12 (×2): qty 100

## 2020-01-12 MED ORDER — HEPARIN SODIUM (PORCINE) 1000 UNIT/ML IJ SOLN
INTRAMUSCULAR | Status: AC
  Administered 2020-01-12: 1000 [IU]
  Filled 2020-01-12: qty 1

## 2020-01-12 MED ORDER — METOCLOPRAMIDE HCL 5 MG PO TABS
5.0000 mg | ORAL_TABLET | Freq: Three times a day (TID) | ORAL | Status: DC
  Administered 2020-01-12 – 2020-01-13 (×3): 5 mg
  Filled 2020-01-12 (×5): qty 1

## 2020-01-12 MED ORDER — INSULIN ASPART 100 UNIT/ML ~~LOC~~ SOLN
0.0000 [IU] | SUBCUTANEOUS | Status: DC
  Administered 2020-01-12: 5 [IU] via SUBCUTANEOUS
  Administered 2020-01-12 – 2020-01-13 (×3): 3 [IU] via SUBCUTANEOUS

## 2020-01-12 NOTE — Progress Notes (Signed)
Inpatient Diabetes Program Recommendations  AACE/ADA: New Consensus Statement on Inpatient Glycemic Control (2015)  Target Ranges:  Prepandial:   less than 140 mg/dL      Peak postprandial:   less than 180 mg/dL (1-2 hours)      Critically ill patients:  140 - 180 mg/dL   Lab Results  Component Value Date   GLUCAP 289 (H) 01/12/2020   HGBA1C 9.6 12/08/2013    Review of Glycemic Control Results for DELLAS, GUARD (MRN [id number]) as of 01/12/2020 09:25  Ref. Range 01/11/2020 15:19 01/11/2020 19:19 01/11/2020 23:39 01/12/2020 03:35 01/12/2020 07:05  Glucose-Capillary Latest Ref Range: 70 - 99 mg/dL 116 (H) 137 (H) 266 (H) 311 (H) 289 (H)   Diabetes history: DM2 Outpatient Diabetes medications: Levemir 26 units qd  Current orders for Inpatient glycemic control: Levemir 23 units q hs + Novolog moderate 0-15 q 4 hrs.  Inpatient Diabetes Program Recommendations:   -Add Novolog 4 units q 4 hrs. Tube feed coverage (hold if tube feed held or stopped for any reason) -Decrease Novolog correction back to sensitive q 4 hrs. Secure chat to Dr.  Harvie Heck with recommendations.  Thank you, Nani Gasser. Candence Sease, RN, MSN, CDE  Diabetes Coordinator Inpatient Glycemic Control Team Team Pager [phone] (8am-5pm) 01/12/2020 9:31 AM

## 2020-01-12 NOTE — Progress Notes (Signed)
eLink Physician-Brief Progress Note Patient Name: [name]. DOB: December 24, 1950 MRN: [id number]   Date of Service  01/12/2020  HPI/Events of Note  ABG on 40%/PRVC 14/TV 610/P 5 = 7.52/40.2/123.  eICU Interventions  Plan: 1. Decrease PRVC rate to 12. 2. ABG at 7:30 AM.     Intervention Category Major Interventions: Acid-Base disturbance - evaluation and management;Respiratory failure - evaluation and management  Lysle Dingwall 01/12/2020, 4:56 AM

## 2020-01-12 NOTE — Progress Notes (Signed)
[facility] Progress Note  Subjective: [name] got HD this am in ICU.  [name]'s sedation is weaning off now per primary team. [name] is half awake but not following commands.   Vitals:   01/12/20 1300 01/12/20 1400 01/12/20 1500 01/12/20 1519  BP: 107/65 (!) 101/55 (!) 100/50   Pulse: 82 82 88   Resp: 17 12 (!) 21   Temp:    97.9 F (36.6 C)  TempSrc:    Axillary  SpO2: 100% 100% 100%   Weight:      Height:        Physical Exam General: on vent,  Heart: RRR; no murmur Lungs: CTA anteriorly Abdomen: soft, non-tender Extremities: no edema Neuro: eyes open but doesn't follow commands Dialysis Access: L AVF w/ infiltration, +bruit R IJ temp HD cath in place  Dialysis: [facility] MWF  4h 15mn  99kg  2/2 bath  RIJ TDC (removed)/ LUA AVF Hep none - Calcitriol 1.276m PO q HD - Mircera 150 mcg IV q 2 weeks (last given on 12/29/19)  Assessment/Plan: 1. Epidural abscess:  MRI showed abscess extending from C4-T2 with cord compression, seen by NSurg and went to OR for C4-T2 for decomp/ laminectomy/ fusion on 01/06/20 with improved pain.  2. MRSA bacteremia: Repeat BCx 8/6 negative. TTE 8/6 without vegetation. TDC removed 8/6, using LUE AVF until infiltrated yest 8/9. On Vancomycin. ID following. 3. Resp failure - intubated on 8/9, per CCM 4. Confusion: meds v. infection v. Uremia. Got good HD Monday night and this am, azotemia is better.  5. ESRD: HD on MWF schedule. HD today in ICU.  6. HTN/volume: BP's wnl, looks dry on exam, 8-18 kg under prior dry wt. No fluid off w/ HD today.  Wt's seem inaccurate.  7. Anemia: Hgb 9.3 - due for ESA later this week. 8. Secondary hyperparathyroidism:  Phos high - started on Renvela [id number] on 8/8 - follow. 9. Nutrition: Alb very low - continue supplements as tolRonnie DerbyD 01/12/2020, 3:35 PM       Objective Vitals:   01/11/20 0500 01/11/20 0600 01/11/20 0700 01/11/20 0715  BP: 136/62 131/62 (!) 125/59 111/60  Pulse: 85 85 82 83   Resp: '14 14 14 14  ' Temp:   99 F (37.2 C)   TempSrc:   Axillary   SpO2: 100% 100% 100% 100%  Weight: 90.3 kg     Height:              [name] 01/12/2020, 3:35 PM   Recent Labs  Lab 01/11/20 0202 01/11/20 0205 01/12/20 0403 01/12/20 0403 01/12/20 0412 01/12/20 1209  K 4.3   < > 4.1   < > 4.0 3.1*  BUN 50*  --  92*  --   --   --   CREATININE 6.11*  --  9.15*  --   --   --   CALCIUM 7.8*  --  7.5*  --   --   --   PHOS 6.9*  --  9.7*  --   --   --   HGB  --    < > 8.0*   < > 8.8* 9.2*   < > = values in this interval not displayed.   Inpatient medications: . atorvastatin  20 mg Per Tube QHS  . chlorhexidine gluconate (MEDLINE KIT)  15 mL Mouth Rinse BID  . Chlorhexidine Gluconate Cloth  6 each Topical Q0600  . feeding supplement (PROSource TF)  45 mL Per  Tube TID  . insulin aspart  0-15 Units Subcutaneous Q4H  . insulin detemir  23 Units Subcutaneous QHS  . mouth rinse  15 mL Mouth Rinse 10 times per day  . metoCLOPramide  5 mg Per Tube Q8H  . metoprolol tartrate  25 mg Per Tube BID  . multivitamin  1 tablet Per Tube QHS  . pantoprazole (PROTONIX) IV  40 mg Intravenous Q24H  . sodium chloride flush  10-40 mL Intracatheter Q12H   . sodium chloride 10 mL/hr at 01/10/20 1800  . feeding supplement (VITAL HIGH PROTEIN) 45 mL/hr at 01/12/20 1500  . phenylephrine (NEO-SYNEPHRINE) Adult infusion    . potassium chloride    . propofol (DIPRIVAN) infusion 8 mcg/kg/min (01/12/20 1500)  . rifampin (RIFADIN) IVPB Stopped (01/12/20 1306)  . vancomycin Stopped (01/12/20 1106)   acetaminophen, docusate, fentaNYL (SUBLIMAZE) injection, hydrALAZINE, midazolam, [DISCONTINUED] ondansetron **OR** ondansetron (ZOFRAN) IV, polyethylene glycol, senna-docusate, sodium chloride flush

## 2020-01-12 NOTE — Progress Notes (Signed)
Pt CBG dropped from 164 to 94. 23u Levemir held. E-link RN notified and agreed with holding. Will continue to monitor glucose levels.

## 2020-01-12 NOTE — Progress Notes (Addendum)
NAME:  Rodney Chiu., MRN:  [id number], DOB:  11-30-1950, LOS: 8 ADMISSION DATE:  01/04/2020, CONSULTATION DATE:  01/10/2020 REFERRING MD:  Jonnie Finner, CHIEF COMPLAINT:  AMS   Brief History   69 year old male with PMHx of ESRD admitted on 8/3 with altered mental status found to have large cervicothoracic dorsal cervical epidural abscess. Underwent laminectomy and drainage with improved mental status. However, noted to have acutely worsened mental status prior to initiation of HD on 8/9 for which PCCM consulted.   Past Medical History   Past Medical History:  Diagnosis Date  . Anemia   . Arthritis    left knee  . Chronic kidney disease    acute renal failure/injury requiring short-term HD 2013  . Colon cancer ([place])   . Colon polyp    11/2011 - Polyps identified, biopsy - invasive adenocarinoma  . DM II (diabetes mellitus, type II), controlled ([place])    type 2 IDDM x 15 years. A1C 1/09 13.7.   Marland Kitchen 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 12/25/2011  . GERD (gastroesophageal reflux disease)   . Heart murmur   . 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 Hyperlipidemia   . Hypertension    for a few years and resolved in 2013/2014.  . Sleep apnea    does not wear CPAP now  . Wears glasses    Significant Hospital Events   8/3> admitted 8/5> cervical and thoracic laminectomy 8/9 > PCCM consulted for AMS and patient transferred to ICU  8/10 > intubated for worsening mental and respiratory status   Consults:  PCCM ID Nephrology Neurosurgery  Procedures:  Cervical and thoracic laminectomy 8/5 R IJ HD catheter 8/9>  ETT 8/10>   Significant Diagnostic Tests:  8/3 MRI c spine: 1. Abnormal fluid in the dorsal spinal canal extending from foramen magnum to the T2 level. Given the other inflammatory changes and concern, this most likely represents epidural infection. There is some fluid ventral to the spinal cord at C2. 2. Diffuse prevertebral edema from the clivus to C6 is  also concerning for infection. 3. The cord is compressed without abnormal signal suggesting edema. 4. Mild edema in the posterior paraspinous muscles, left greater than right. Edema is most significant within the paraspinous muscles just below the occiput. No focal abscess is present. 5. Severe disc disease at C5-6 and C6-7 with moderate central canal stenosis at both levels. 6. Mild disc disease at C3-4 and C4-5 with some cord compression at these levels. 7. Abnormal marrow signal in the lower cervical spine, particularly C7 and T1. This is nonspecific and can be seen in the setting of anemia, smoking, or obesity. 8/4 MRI thoracic: 1. Dorsal epidural fluid collection extending from the cervical spine to T2 level, consistent with epidural abscess. No continuation below the T2 level. 2. No thoracic spinal canal or neural foraminal stenosis. 8/5 MRI lumbar: 1. There is what appears to be a subdural fluid collection at the posterior left quadrant extending from T12-L1 to L2-3, with maximal diameter on the axial images 7 x 11 mm in cross-sectional diameter. This causes mass effect upon the neural structures, crowding them anteriorly. Additionally, there is probably some epidural material posteriorly at L3 to L4. This could be a small subdural component of this as well. This could be infected material. 2. 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 There is 2 mm of anterolisthesis at L2-3, L3-4 and L4-5. There is disc degeneration with bulging of the discs.  There is moderate to severe multifactorial spinal stenosis at L3-4 and severe spinal stenosis at L4-5 that could cause neural compression. Facet joint edema at L2-3, L3-4 and L4-5 could be degenerative or potentially relate to infection. The single most suspicious looking facet joint is on the right at L3-4. Additionally, the posterior para spinous soft tissue structures show edematous change  which could be inflammatory. 8/6 echo: LVEF 06%, grade 2 diastolic dysfunction, trival MR and TR 8/10 CT Head:1. No acute intracranial abnormality. Stable thalamic small vessel disease. 2. Continued abnormal upper cervical epidural space. Partially visible cervical posterior midline postoperative changes. 8/10 MRI Head: No evidence of recent infarction, hemorrhage, mass. Stable chronic microhemorrhages and microvascular ischemic changes   Micro Data:  8/3 blood: MRSA 8/3 urine: neg 8/5 intraop: MRSA 8/6 blood: negative 8/9 MRSA negative   Antimicrobials:  Vancomycin 8/4>8/6, 8/9> Rifampin 8/9>   Interim history/subjective:  Overnight, no acute events. Continues to be alkalotic for which vent settings adjusted. Remains mechanically ventilated and heavily sedated. HD session in progress.   Objective   Blood pressure (!) 102/57, pulse 80, temperature (!) 97.3 F (36.3 C), temperature source Axillary, resp. rate 13, height 5' 11.5" (1.816 m), weight 80.9 kg, SpO2 100 %.    Vent Mode: PRVC FiO2 (%):  [40 %] 40 % Set Rate:  [12 bmp-14 bmp] 12 bmp Vt Set:  [610 mL] 610 mL PEEP:  [5 cmH20] 5 cmH20 Plateau Pressure:  [11 cmH20-20 cmH20] 16 cmH20   Intake/Output Summary (Last 24 hours) at 01/12/2020 0814 Last data filed at 01/12/2020 0700 Gross per 24 hour  Intake 1298.65 ml  Output 1050 ml  Net 248.65 ml   Filed Weights   01/11/20 0500 01/12/20 0449 01/12/20 0645  Weight: 90.3 kg 80.9 kg 80.9 kg    Examination: General: critically ill appearing male, intubated and mechanically ventilated HENT: [place]/AT, PERRL, ET tube in place Lungs: clear to auscultation bilaterally Cardiovascular: RRR, S1 and S2 present, no m/r/g Abdomen: soft, nondistended, nontender, normoactive bowel sounds Extremities: no pitting edema noted in bilateral lower extremities Neuro: heavily sedated, PERRL  Assessment & Plan:  Acute hypoxic respiratory failure with compromised airway  Suspect respiratory  failure secondary to his acute encephalopathy of unclear etiology;  - Continue mechanical ventilation for TV 4-8 cc/kg; Goal SpO2 >90% - VAP per protocol  Acute metabolic toxic encephalopathy: Presumed to be infectious in nature in setting of MRSA bacteremia; however, repeat cultures negative and patient on vancomycin and rifampin; CT and MRI head without acute intracranial abnormalities.  Does have history of ESRD so possibly uremic. He is getting HD session this morning. Will wean off sedation following HD session and assess mental status once more awake.  - Wean sedation as tolerated for RASS 0 to -1   MRSA bacteremia  Epidural abscess s/p laminectomy: Afebrile and leukocytosis is stable.  - Continued on vancomycin and rifampin  Metabolic, respiratory alkalosis: Approximately 1L NG tube output over past 24 hours. Metabolic alkalosis in setting of GI losses. No concerns for obstruction at this time. Suspect possible gastroparesis but no history of this either. - Reglan 5mg  q8h   - Continue to monitor   ESRD on HD: - HD per nephrology  Anemia of chronic disease: - Currently at baseline  - ESA per nephrology   Type 2 DM with hyperglycemia Levemir increased to 23U and SSI to moderate for improved glycemic control - Goal CBG <180  Best practice:  Diet: tube feeds  Pain/Anxiety/Delirium protocol (if indicated):  per protocol  VAP protocol (if indicated): per protocol  DVT prophylaxis: SCDs GI prophylaxis: PPI Glucose control: levemir + SSI Mobility: bed rest Code Status: FULL  Family Communication: patient's daughter, Jene Oravec, updated via phone on 8/11 Disposition: ICU  Critical care time: 35 minutes     Harvie Heck, MD Internal Medicine, PGY-2 01/12/20 8:14 AM Pager # [phone]

## 2020-01-12 NOTE — Progress Notes (Signed)
[facility] for Infectious Disease    Date of Admission:  01/04/2020   Total days of antibiotics 6/day 3 of vanco/rif           ID: [name]. is a 69 y.o. male with mrsa bacteremia with cervical spine epidural abscess s/p debridement and HW placement Principal Problem:   MRSA bacteremia Active Problems:   TOBACCO ABUSE   OBSTRUCTIVE SLEEP APNEA   Colon cancer, status Post resection    HYPERTENSION   ESRD (end stage renal disease) ([place])   Epidural abscess   Acute encephalopathy   Sepsis ([place])   Bacteremia associated with intravascular line ([place])   Hemodialysis patient ([place])    Subjective: Afebrile, remains intubated, agitated and moves all extremities when sedation lightened  Medications:  . atorvastatin  20 mg Per Tube QHS  . chlorhexidine gluconate (MEDLINE KIT)  15 mL Mouth Rinse BID  . Chlorhexidine Gluconate Cloth  6 each Topical Q0600  . feeding supplement (PROSource TF)  45 mL Per Tube TID  . insulin aspart  0-15 Units Subcutaneous Q4H  . insulin detemir  23 Units Subcutaneous QHS  . mouth rinse  15 mL Mouth Rinse 10 times per day  . metoCLOPramide  5 mg Per Tube Q8H  . metoprolol tartrate  25 mg Per Tube BID  . multivitamin  1 tablet Per Tube QHS  . pantoprazole (PROTONIX) IV  40 mg Intravenous Q24H  . sodium chloride flush  10-40 mL Intracatheter Q12H    Objective: Vital signs in last 24 hours: Temp:  [97.3 F (36.3 C)-97.9 F (36.6 C)] 97.9 F (36.6 C) (08/11 1519) Pulse Rate:  [51-104] 104 (08/11 1700) Resp:  [11-32] 25 (08/11 1700) BP: (83-133)/(50-90) 125/58 (08/11 1700) SpO2:  [100 %] 100 % (08/11 1700) FiO2 (%):  [40 %] 40 % (08/11 1604) Weight:  [80.9 kg-81.7 kg] 81.7 kg (08/11 1105) Physical Exam  Constitutional: He is sedated. He appears well-developed and well-nourished. No distress.  HENT: OETT in place Neck:right ij Cardiovascular: Normal rate, regular rhythm and normal heart sounds. Exam reveals no gallop and no friction  rub.  No murmur heard.  Pulmonary/Chest: Effort normal and breath sounds normal. No respiratory distress. He has no wheezes.  Abdominal: Soft. Bowel sounds are normal. He exhibits no distension. There is no tenderness.  Ext: left arm bandaged from HD Neurological: He is alert and oriented to person, place, and time.  Skin: Skin is warm and dry. No rash noted. No erythema.     Lab Results Recent Labs    01/11/20 0202 01/11/20 0205 01/11/20 0818 01/11/20 1941 01/12/20 0403 01/12/20 0403 01/12/20 0412 01/12/20 1209  WBC  --   --  20.2*  --  20.5*  --   --   --   HGB  --    < > 8.7*   < > 8.0*   < > 8.8* 9.2*  HCT  --    < > 25.6*   < > 24.0*   < > 26.0* 27.0*  NA 138   < >  --    < > 137   < > 137 137  K 4.3   < >  --    < > 4.1   < > 4.0 3.1*  CL 96*  --   --   --  91*  --   --   --   CO2 24  --   --   --  29  --   --   --  BUN 50*  --   --   --  92*  --   --   --   CREATININE 6.11*  --   --   --  9.15*  --   --   --    < > = values in this interval not displayed.   Liver Panel Recent Labs    01/10/20 1355 01/10/20 1355 01/11/20 0202 01/12/20 0403  PROT 7.6  --   --   --   ALBUMIN 1.9*   < > 2.4* 2.0*  AST 27  --   --   --   ALT 24  --   --   --   ALKPHOS 98  --   --   --   BILITOT 2.5*  --   --   --   BILIDIR 1.1*  --   --   --   IBILI 1.4*  --   --   --    < > = values in this interval not displayed.   Microbiology: 8/5 tissue MRSA Studies/Results: CT HEAD WO CONTRAST  Result Date: 01/11/2020 CLINICAL DATA:  70 year old male with altered mental status. Cervical spine epidural abscess, postop day 5. Intubated. EXAM: CT HEAD WITHOUT CONTRAST TECHNIQUE: Contiguous axial images were obtained from the base of the skull through the vertex without intravenous contrast. COMPARISON:  Brain MRI 12/31/2019.  Cervical spine MRI 01/04/2020. FINDINGS: Brain: Abnormal appearance of the visible upper cervical spinal canal on series 3, image 1, with epidural fluid or soft  tissue, probably not significantly changed from the abnormal cervical MRI 01/04/2020 (please see that report) No ventriculomegaly. No intracranial mass effect. Basilar cisterns remain patent. 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 Elsewhere cerebral gray-white matter differentiation remains within normal limits. No cortically based acute infarct identified. No acute intracranial hemorrhage identified. Vascular: No suspicious intracranial vascular hyperdensity. Skull: The skull appears stable and intact. Sinuses/Orbits: Minimal fluid in a posterior right ethmoid air cell. Other Visualized paranasal sinuses and mastoids are stable and well pneumatized. Other: Partially visible midline suboccipital postoperative changes to the scalp (series 4, image 1). No other acute orbit or scalp soft tissue finding. IMPRESSION: 1. No acute intracranial abnormality. Stable thalamic small vessel disease. 2. Continued abnormal upper cervical epidural space. Partially visible cervical posterior midline postoperative changes. Electronically Signed   By: Genevie Ann M.D.   On: 01/11/2020 01:21   MR BRAIN WO CONTRAST  Result Date: 01/11/2020 CLINICAL DATA:  Mental status change EXAM: MRI HEAD WITHOUT CONTRAST TECHNIQUE: Multiplanar, multiecho pulse sequences of the brain and surrounding structures were obtained without intravenous contrast. COMPARISON:  12/31/2019 FINDINGS: Brain: There is no acute infarction or intracranial hemorrhage. There is no intracranial mass, mass effect, or edema. There is no hydrocephalus or extra-axial fluid collection. Prominence of the ventricles and sulci reflects mild generalized parenchymal volume loss. Patchy and confluent areas of T2 hyperintensity in the supratentorial white matter are nonspecific but may reflect moderate chronic microvascular ischemic changes. There are chronic small vessel infarcts of the thalamus and right pons. Several small foci of susceptibility  hypointensity are present likely reflecting chronic microhemorrhages also present on the prior study. Vascular: Major vessel flow voids at the skull base are preserved. Skull and upper cervical spine: Normal marrow signal is preserved. Sinuses/Orbits: Paranasal sinuses are aerated. Orbits are unremarkable. Other: Sella is unremarkable.  Mastoid air cells are clear. IMPRESSION: No evidence of recent infarction, hemorrhage, mass. Stable chronic findings detailed above. Electronically Signed  By: Macy Mis M.D.   On: 01/11/2020 11:56   DG Chest Port 1 View  Result Date: 01/12/2020 CLINICAL DATA:  Respiratory failure. EXAM: PORTABLE CHEST 1 VIEW COMPARISON:  The chest x-ray 01/11/2020 FINDINGS: The heart size is normal. Endotracheal tube terminates 5.5 cm above the carina. Enteric tube courses off the inferior border the film. Right IJ line is stable. Left lower lobe airspace opacity is stable. No new airspace disease is present. IMPRESSION: 1. Stable left lower lobe airspace disease. 2. Support apparatus is stable. Electronically Signed   By: San Morelle M.D.   On: 01/12/2020 05:49   DG CHEST PORT 1 VIEW  Result Date: 01/11/2020 CLINICAL DATA:  Respiratory distress. EXAM: PORTABLE CHEST 1 VIEW COMPARISON:  Earlier film, same date. FINDINGS: The endotracheal tube, NG tube and right IJ catheters are stable. The cardiac silhouette, mediastinal and hilar contours are within normal limits and stable. The lungs are clear. IMPRESSION: 1. Stable support apparatus. 2. No acute cardiopulmonary findings. Electronically Signed   By: Marijo Sanes M.D.   On: 01/11/2020 05:45   DG CHEST PORT 1 VIEW  Result Date: 01/11/2020 CLINICAL DATA:  69 year old male intubated, enteric tube placement. Recent posterior C4-T1 fusion. EXAM: PORTABLE CHEST 1 VIEW COMPARISON:  Portable chest 01/10/2020 and earlier. FINDINGS: Portable AP semi upright view at 0024 hours. Stable right IJ central line. Endotracheal tube tip  projects about 8 mm above the carina. Enteric tube courses to the abdomen and into the stomach. Improved lung volumes. Mediastinal contours remain within normal limits. Allowing for portable technique the lungs are clear. Negative visible bowel gas pattern. Posterior cervicothoracic spinal fusion hardware with skin staples. IMPRESSION: 1. Endotracheal tube tip approximately 8 mm above the carina. Enteric tube courses to the stomach. 2.  No acute cardiopulmonary abnormality. Electronically Signed   By: Genevie Ann M.D.   On: 01/11/2020 00:56   DG Chest Port 1 View  Result Date: 01/10/2020 CLINICAL DATA:  69 year old male status post central line placement. EXAM: PORTABLE CHEST 1 VIEW COMPARISON:  Radiograph dated 01/04/2020 FINDINGS: Right IJ central venous line with tip over upper SVC. No pneumothorax. There is no focal consolidation, or pleural effusion. Top-normal cardiac size. No acute osseous pathology. Partially visualized lower cervical fusion. IMPRESSION: Right IJ central venous line with tip over upper SVC. No pneumothorax. Electronically Signed   By: Anner Crete M.D.   On: 01/10/2020 18:45   DG Abd Portable 1V  Result Date: 01/11/2020 CLINICAL DATA:  69 year old male intubated, enteric tube placement. EXAM: PORTABLE ABDOMEN - 1 VIEW COMPARISON:  Abdominal radiograph 09/05/2012. FINDINGS: Portable AP supine view at 0032 hours. Enteric tube has been placed into the stomach with side hole at the level of the gastric antrum. Tube tip may be within the proximal duodenum. Non obstructed bowel gas pattern. No acute osseous abnormality identified. IMPRESSION: Enteric tube placed into the stomach, tube tip may be within the proximal duodenum. Electronically Signed   By: Genevie Ann M.D.   On: 01/11/2020 00:54     Assessment/Plan: Cervical epidural abscess and bacteremia = will treat for 8 wk with vancomycin with HD, plus rifampin. Once alert can switch to oral rifampin  Long term medication management =  would check LFT weekly when on rifampin   [facility] Cell: [phone] Pager: [phone]  01/12/2020, 6:02 PM

## 2020-01-13 DIAGNOSIS — J9601 Acute respiratory failure with hypoxia: Secondary | ICD-10-CM | POA: Diagnosis not present

## 2020-01-13 LAB — CBC
HCT: 22.7 % — ABNORMAL LOW (ref 39.0–52.0)
Hemoglobin: 7.5 g/dL — ABNORMAL LOW (ref 13.0–17.0)
MCH: 26.9 pg (ref 26.0–34.0)
MCHC: 33 g/dL (ref 30.0–36.0)
MCV: 81.4 fL (ref 80.0–100.0)
Platelets: 271 10*3/uL (ref 150–400)
RBC: 2.79 MIL/uL — ABNORMAL LOW (ref 4.22–5.81)
RDW: 17.4 % — ABNORMAL HIGH (ref 11.5–15.5)
WBC: 22.3 10*3/uL — ABNORMAL HIGH (ref 4.0–10.5)
nRBC: 0.2 % (ref 0.0–0.2)

## 2020-01-13 LAB — COMPREHENSIVE METABOLIC PANEL
ALT: 20 U/L (ref 0–44)
AST: 30 U/L (ref 15–41)
Albumin: 1.8 g/dL — ABNORMAL LOW (ref 3.5–5.0)
Alkaline Phosphatase: 77 U/L (ref 38–126)
Anion gap: 14 (ref 5–15)
BUN: 60 mg/dL — ABNORMAL HIGH (ref 8–23)
CO2: 26 mmol/L (ref 22–32)
Calcium: 7.5 mg/dL — ABNORMAL LOW (ref 8.9–10.3)
Chloride: 94 mmol/L — ABNORMAL LOW (ref 98–111)
Creatinine, Ser: 6.16 mg/dL — ABNORMAL HIGH (ref 0.61–1.24)
GFR calc Af Amer: 10 mL/min — ABNORMAL LOW (ref >60)
GFR calc non Af Amer: 9 mL/min — ABNORMAL LOW (ref >60)
Glucose, Bld: 245 mg/dL — ABNORMAL HIGH (ref 70–99)
Potassium: 3.9 mmol/L (ref 3.5–5.1)
Sodium: 134 mmol/L — ABNORMAL LOW (ref 135–145)
Total Bilirubin: 2.3 mg/dL — ABNORMAL HIGH (ref 0.3–1.2)
Total Protein: 6.5 g/dL (ref 6.5–8.1)

## 2020-01-13 LAB — GLUCOSE, CAPILLARY
Glucose-Capillary: 191 mg/dL — ABNORMAL HIGH (ref 70–99)
Glucose-Capillary: 237 mg/dL — ABNORMAL HIGH (ref 70–99)
Glucose-Capillary: 257 mg/dL — ABNORMAL HIGH (ref 70–99)
Glucose-Capillary: 204 mg/dL — ABNORMAL HIGH (ref 70–99)
Glucose-Capillary: 143 mg/dL — ABNORMAL HIGH (ref 70–99)
Glucose-Capillary: 150 mg/dL — ABNORMAL HIGH (ref 70–99)

## 2020-01-13 LAB — PHOSPHORUS: Phosphorus: 6 mg/dL — ABNORMAL HIGH (ref 2.5–4.6)

## 2020-01-13 LAB — TRIGLYCERIDES: Triglycerides: 116 mg/dL (ref <150)

## 2020-01-13 MED ORDER — INSULIN ASPART 100 UNIT/ML ~~LOC~~ SOLN
0.0000 [IU] | SUBCUTANEOUS | Status: DC
  Administered 2020-01-13: 5 [IU] via SUBCUTANEOUS
  Administered 2020-01-13: 1 [IU] via SUBCUTANEOUS
  Administered 2020-01-13 (×2): 3 [IU] via SUBCUTANEOUS
  Administered 2020-01-13: 1 [IU] via SUBCUTANEOUS
  Administered 2020-01-14 – 2020-01-15 (×2): 2 [IU] via SUBCUTANEOUS
  Administered 2020-01-15 – 2020-01-17 (×3): 1 [IU] via SUBCUTANEOUS
  Administered 2020-01-18: 2 [IU] via SUBCUTANEOUS

## 2020-01-13 MED ORDER — HYDROMORPHONE HCL 1 MG/ML IJ SOLN: 1.0000 mg | Freq: Once | INTRAMUSCULAR | Status: AC

## 2020-01-13 MED ORDER — ATORVASTATIN CALCIUM 10 MG PO TABS
20.0000 mg | ORAL_TABLET | Freq: Every day | ORAL | Status: DC
  Administered 2020-01-13 – 2020-01-18 (×4): 20 mg via ORAL
  Filled 2020-01-13 (×7): qty 2

## 2020-01-13 MED ORDER — PROSOURCE PLUS PO LIQD
30.0000 mL | Freq: Two times a day (BID) | ORAL | Status: DC
  Administered 2020-01-15 – 2020-01-23 (×6): 30 mL via ORAL
  Filled 2020-01-13 (×13): qty 30

## 2020-01-13 MED ORDER — NEPRO/CARBSTEADY PO LIQD
237.0000 mL | Freq: Two times a day (BID) | ORAL | Status: DC
  Administered 2020-01-13: 237 mL via ORAL
  Administered 2020-01-14: 180 mL via ORAL
  Administered 2020-01-15 – 2020-01-18 (×5): 237 mL via ORAL

## 2020-01-13 MED ORDER — METOPROLOL TARTRATE 25 MG PO TABS
25.0000 mg | ORAL_TABLET | Freq: Two times a day (BID) | ORAL | Status: DC
  Administered 2020-01-13 – 2020-01-24 (×12): 25 mg via ORAL
  Filled 2020-01-13 (×8): qty 1
  Filled 2020-01-13: qty 2
  Filled 2020-01-13 (×2): qty 1
  Filled 2020-01-13: qty 2
  Filled 2020-01-13 (×4): qty 1

## 2020-01-13 MED ORDER — HYDROMORPHONE HCL 1 MG/ML IJ SOLN
INTRAMUSCULAR | Status: AC
  Administered 2020-01-13: 1 mg via INTRAVENOUS
  Filled 2020-01-13: qty 1

## 2020-01-13 MED ORDER — INSULIN DETEMIR 100 UNIT/ML ~~LOC~~ SOLN
11.0000 [IU] | Freq: Two times a day (BID) | SUBCUTANEOUS | Status: DC
  Administered 2020-01-13 – 2020-01-24 (×11): 11 [IU] via SUBCUTANEOUS
  Filled 2020-01-13 (×27): qty 0.11

## 2020-01-13 MED ORDER — ACETAMINOPHEN 325 MG PO TABS
650.0000 mg | ORAL_TABLET | Freq: Four times a day (QID) | ORAL | Status: DC | PRN
  Administered 2020-01-14: 650 mg via ORAL
  Filled 2020-01-13: qty 2

## 2020-01-13 MED ORDER — HYDROCODONE-ACETAMINOPHEN 5-325 MG PO TABS
1.0000 | ORAL_TABLET | ORAL | Status: DC | PRN
  Administered 2020-01-13 – 2020-01-14 (×3): 1 via ORAL
  Filled 2020-01-13 (×3): qty 1

## 2020-01-13 MED ORDER — PHENYLEPHRINE CONCENTRATED 100MG/250ML (0.4 MG/ML) INFUSION SIMPLE
0.0000 ug/min | INTRAVENOUS | Status: DC
  Administered 2020-01-13: 30 ug/min via INTRAVENOUS
  Filled 2020-01-13: qty 250

## 2020-01-13 MED ORDER — CLONAZEPAM 0.5 MG PO TABS
0.5000 mg | ORAL_TABLET | Freq: Two times a day (BID) | ORAL | Status: DC | PRN
  Administered 2020-01-13 – 2020-01-24 (×4): 0.5 mg via ORAL
  Filled 2020-01-13 (×6): qty 1

## 2020-01-13 MED ORDER — METOCLOPRAMIDE HCL 5 MG PO TABS
5.0000 mg | ORAL_TABLET | Freq: Three times a day (TID) | ORAL | Status: DC
  Administered 2020-01-13 – 2020-01-14 (×3): 5 mg via ORAL
  Filled 2020-01-13 (×4): qty 1

## 2020-01-13 MED ORDER — QUETIAPINE FUMARATE 25 MG PO TABS
12.5000 mg | ORAL_TABLET | Freq: Two times a day (BID) | ORAL | Status: DC
  Administered 2020-01-13 – 2020-01-14 (×3): 12.5 mg via ORAL
  Filled 2020-01-13 (×4): qty 1

## 2020-01-13 MED ORDER — RENA-VITE PO TABS
1.0000 | ORAL_TABLET | Freq: Every day | ORAL | Status: DC
  Administered 2020-01-13 – 2020-01-15 (×3): 1 via ORAL
  Filled 2020-01-13 (×6): qty 1

## 2020-01-13 NOTE — Progress Notes (Signed)
RT note: patient placed on CPAP/PSV of 12/5 at 0730.  Currently tolerating well.  Will continue to monitor.

## 2020-01-13 NOTE — Procedures (Signed)
Extubation Procedure Note  Patient Details:   Name: [name]. DOB: 1951/03/29 MRN: [id number]   Airway Documentation:    Vent end date: 01/13/20 Vent end time: 1017   Evaluation  O2 sats: stable throughout Complications: No apparent complications Patient did tolerate procedure well. Bilateral Breath Sounds: Rhonchi   Yes   Patient extubated to 2L nasal cannula per MD order.  Positive cuff leak noted.  No evidence of stridor.  Patient able to speak post extubation.  Sats currently 100%.  Vitals are stable.  No complications noted.    Judith Part 01/13/2020, 10:21 AM

## 2020-01-13 NOTE — Progress Notes (Signed)
[facility]    Date of Admission:  01/04/2020   Total days of antibiotics 7         day 3 of vanco/rif           ID: [name]. is a 69 y.o. male with mrsa bacteremia with cervical spine epidural abscess s/p debridement and HW placement  Principal Problem:   MRSA bacteremia Active Problems:   TOBACCO ABUSE   OBSTRUCTIVE SLEEP APNEA   Colon cancer, status Post resection    HYPERTENSION   ESRD (end stage renal disease) ([place])   Epidural abscess   Acute encephalopathy   Sepsis ([place])   Bacteremia associated with intravascular line ([place])   Hemodialysis patient ([place])    Subjective: Extubating now.  Still with right shoulder pain and neck pain  AFebrile WBC 22.3K today --> trending up since 8/07    Medications:  . atorvastatin  20 mg Per Tube QHS  . chlorhexidine gluconate (MEDLINE KIT)  15 mL Mouth Rinse BID  . Chlorhexidine Gluconate Cloth  6 each Topical Q0600  . feeding supplement (PROSource TF)  45 mL Per Tube TID  . insulin aspart  0-9 Units Subcutaneous Q4H  . insulin detemir  11 Units Subcutaneous BID  . mouth rinse  15 mL Mouth Rinse 10 times per day  . metoCLOPramide  5 mg Per Tube Q8H  . metoprolol tartrate  25 mg Per Tube BID  . multivitamin  1 tablet Per Tube QHS  . pantoprazole (PROTONIX) IV  40 mg Intravenous Q24H  . sodium chloride flush  10-40 mL Intracatheter Q12H    Objective: Vital signs in last 24 hours: Temp:  [97.4 F (36.3 C)-98.3 F (36.8 C)] 98.1 F (36.7 C) (08/12 0745) Pulse Rate:  [59-104] 65 (08/12 1000) Resp:  [8-32] 16 (08/12 1000) BP: (75-133)/(50-66) 130/57 (08/12 1000) SpO2:  [97 %-100 %] 100 % (08/12 1000) FiO2 (%):  [40 %] 40 % (08/12 0731) Weight:  [81.7 kg-91.2 kg] 91.2 kg (08/12 0500)   Physical Exam  Constitutional: He is sedated. He appears well-developed and well-nourished. No distress.  HENT: eyes equal round and reactive.  Neck:right ij/HD line  Cardiovascular: Normal rate,  regular rhythm and normal heart sounds. Exam reveals no gallop and no friction rub. No murmur heard.  Pulmonary/Chest: Effort normal and breath sounds normal. No respiratory distress. He has no wheezes. Left chest tenderness to left of sternum - no swelling or erythema  Abdominal: Soft. Bowel sounds are normal. He exhibits no distension. There is no tenderness.  Ext: left arm bandaged from HD Musculoskeletal: R shoulder tenderness, no overt swelling or pain.  Neurological: He is alert and oriented to person, place, and time.  Skin: Skin is warm and dry. No rash noted. No erythema.     Lab Results Recent Labs    01/12/20 0403 01/12/20 0403 01/12/20 0412 01/12/20 1209 01/12/20 2029 01/13/20 0413  WBC 20.5*  --   --   --   --  22.3*  HGB 8.0*  --    < > 9.2*  --  7.5*  HCT 24.0*  --    < > 27.0*  --  22.7*  NA 137   < >   < > 137 134* 134*  K 4.1   < >   < > 3.1* 3.8 3.9  CL 91*   < >  --   --  94* 94*  CO2 29   < >  --   --  29 26  BUN 92*   < >  --   --  48* 60*  CREATININE 9.15*   < >  --   --  5.25* 6.16*   < > = values in this interval not displayed.   Liver Panel Recent Labs    01/10/20 1355 01/11/20 0202 01/12/20 0403 01/13/20 0413  PROT 7.6  --   --  6.5  ALBUMIN 1.9*   < > 2.0* 1.8*  AST 27  --   --  30  ALT 24  --   --  20  ALKPHOS 98  --   --  77  BILITOT 2.5*  --   --  2.3*  BILIDIR 1.1*  --   --   --   IBILI 1.4*  --   --   --    < > = values in this interval not displayed.   Microbiology: 8/5 tissue MRSA Studies/Results: MR BRAIN WO CONTRAST  Result Date: 01/11/2020 CLINICAL DATA:  Mental status change EXAM: MRI HEAD WITHOUT CONTRAST TECHNIQUE: Multiplanar, multiecho pulse sequences of the brain and surrounding structures were obtained without intravenous contrast. COMPARISON:  12/31/2019 FINDINGS: Brain: There is no acute infarction or intracranial hemorrhage. There is no intracranial mass, mass effect, or edema. There is no hydrocephalus or extra-axial  fluid collection. Prominence of the ventricles and sulci reflects mild generalized parenchymal volume loss. Patchy and confluent areas of T2 hyperintensity in the supratentorial white matter are nonspecific but may reflect moderate chronic microvascular ischemic changes. There are chronic small vessel infarcts of the thalamus and right pons. Several small foci of susceptibility hypointensity are present likely reflecting chronic microhemorrhages also present on the prior study. Vascular: Major vessel flow voids at the skull base are preserved. Skull and upper cervical spine: Normal marrow signal is preserved. Sinuses/Orbits: Paranasal sinuses are aerated. Orbits are unremarkable. Other: Sella is unremarkable.  Mastoid air cells are clear. IMPRESSION: No evidence of recent infarction, hemorrhage, mass. Stable chronic findings detailed above. Electronically Signed   By: Macy Mis M.D.   On: 01/11/2020 11:56   DG Chest Port 1 View  Result Date: 01/12/2020 CLINICAL DATA:  Respiratory failure. EXAM: PORTABLE CHEST 1 VIEW COMPARISON:  The chest x-ray 01/11/2020 FINDINGS: The heart size is normal. Endotracheal tube terminates 5.5 cm above the carina. Enteric tube courses off the inferior border the film. Right IJ line is stable. Left lower lobe airspace opacity is stable. No new airspace disease is present. IMPRESSION: 1. Stable left lower lobe airspace disease. 2. Support apparatus is stable. Electronically Signed   By: San Morelle M.D.   On: 01/12/2020 05:49     Assessment/Plan: Cervical epidural abscess s/p debridement & Fusion = Site looks clean and no drainage from incision. Has a hard time turning neck to the right d/t pain. ?muscle spasms.  Will treat for 8 wk with vancomycin with HD, plus rifampin PO BID. Once alert and able to swallow pills can switch to oral rifampin in the next day or two   MRSA Bacteremia = secondary to #1. TTE was negative and no findings of significant valvular  insufficiency/stenosis. He has generalized pain increased with upper extremities. No joints appear inflamed and has no tenderness to palpation around joint/musculature but does complain of pain with. Will follow clinically as he clears up to determine further imaging needs.   Respiratory Failure = extubated today to nasal cannula. Intubated d/t increased WOB/encephalopathy.   Long term medication management = would check LFT weekly  when on rifampin. ESR / CRP weekly.    [name], MSN, NP-C [facility] [email or web address] Pager: [phone] Office: [phone] [place]: [phone]

## 2020-01-13 NOTE — Progress Notes (Signed)
NAME:  Rodney Nghiem., MRN:  [id number], DOB:  10/04/1950, LOS: 9 ADMISSION DATE:  01/04/2020, CONSULTATION DATE:  01/10/2020 REFERRING MD:  Jonnie Finner, CHIEF COMPLAINT:  AMS   Brief History   69 year old male with PMHx of ESRD admitted on 8/3 with altered mental status found to have large cervicothoracic dorsal cervical epidural abscess. Underwent laminectomy and drainage with improved mental status. However, noted to have acutely worsened mental status prior to initiation of HD on 8/9 for which PCCM consulted.   Past Medical History   Past Medical History:  Diagnosis Date  . Anemia   . Arthritis    left knee  . Chronic kidney disease    acute renal failure/injury requiring short-term HD 2013  . Colon cancer ([place])   . Colon polyp    11/2011 - Polyps identified, biopsy - invasive adenocarinoma  . DM II (diabetes mellitus, type II), controlled ([place])    type 2 IDDM x 15 years. A1C 1/09 13.7.   Marland Kitchen 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 12/25/2011  . GERD (gastroesophageal reflux disease)   . Heart murmur   . 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 Hyperlipidemia   . Hypertension    for a few years and resolved in 2013/2014.  . Sleep apnea    does not wear CPAP now  . Wears glasses    Significant Hospital Events   8/3> admitted 8/5> cervical and thoracic laminectomy 8/9 > PCCM consulted for AMS and patient transferred to ICU  8/10 > intubated for worsening mental and respiratory status   Consults:  PCCM ID Nephrology Neurosurgery  Procedures:  Cervical and thoracic laminectomy 8/5 R IJ HD catheter 8/9>  ETT 8/10>   Significant Diagnostic Tests:  8/3 MRI c spine: 1. Abnormal fluid in the dorsal spinal canal extending from foramen magnum to the T2 level. Given the other inflammatory changes and concern, this most likely represents epidural infection. There is some fluid ventral to the spinal cord at C2. 2. Diffuse prevertebral edema from the clivus to C6 is  also concerning for infection. 3. The cord is compressed without abnormal signal suggesting edema. 4. Mild edema in the posterior paraspinous muscles, left greater than right. Edema is most significant within the paraspinous muscles just below the occiput. No focal abscess is present. 5. Severe disc disease at C5-6 and C6-7 with moderate central canal stenosis at both levels. 6. Mild disc disease at C3-4 and C4-5 with some cord compression at these levels. 7. Abnormal marrow signal in the lower cervical spine, particularly C7 and T1. This is nonspecific and can be seen in the setting of anemia, smoking, or obesity. 8/4 MRI thoracic: 1. Dorsal epidural fluid collection extending from the cervical spine to T2 level, consistent with epidural abscess. No continuation below the T2 level. 2. No thoracic spinal canal or neural foraminal stenosis. 8/5 MRI lumbar: 1. There is what appears to be a subdural fluid collection at the posterior left quadrant extending from T12-L1 to L2-3, with maximal diameter on the axial images 7 x 11 mm in cross-sectional diameter. This causes mass effect upon the neural structures, crowding them anteriorly. Additionally, there is probably some epidural material posteriorly at L3 to L4. This could be a small subdural component of this as well. This could be infected material. 2. 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 There is 2 mm of anterolisthesis at L2-3, L3-4 and L4-5. There is disc degeneration with bulging of the discs.  There is moderate to severe multifactorial spinal stenosis at L3-4 and severe spinal stenosis at L4-5 that could cause neural compression. Facet joint edema at L2-3, L3-4 and L4-5 could be degenerative or potentially relate to infection. The single most suspicious looking facet joint is on the right at L3-4. Additionally, the posterior para spinous soft tissue structures show edematous change  which could be inflammatory. 8/6 echo: LVEF 29%, grade 2 diastolic dysfunction, trival MR and TR 8/10 CT Head:1. No acute intracranial abnormality. Stable thalamic small vessel disease. 2. Continued abnormal upper cervical epidural space. Partially visible cervical posterior midline postoperative changes. 8/10 MRI Head: No evidence of recent infarction, hemorrhage, mass. Stable chronic microhemorrhages and microvascular ischemic changes   Micro Data:  8/3 blood: MRSA 8/3 urine: neg 8/5 intraop: MRSA 8/6 blood: negative 8/9 MRSA negative   Antimicrobials:  Vancomycin 8/4>8/6, 8/9> Rifampin 8/9>   Interim history/subjective:  Overnight, patient with increased agitation and some hypotension for which started on levophed. Long-acting insulin held by RN for CBG of 94.   Objective   Blood pressure (!) 133/57, pulse 60, temperature 98.2 F (36.8 C), temperature source Oral, resp. rate 12, height 5' 11.5" (1.816 m), weight 91.2 kg, SpO2 100 %.    Vent Mode: PRVC FiO2 (%):  [40 %] 40 % Set Rate:  [12 bmp] 12 bmp Vt Set:  [610 mL] 610 mL PEEP:  [5 cmH20] 5 cmH20 Pressure Support:  [8 cmH20] 8 cmH20 Plateau Pressure:  [13 cmH20-17 cmH20] 14 cmH20   Intake/Output Summary (Last 24 hours) at 01/13/2020 0716 Last data filed at 01/13/2020 0700 Gross per 24 hour  Intake 1684.27 ml  Output -750 ml  Net 2434.27 ml   Filed Weights   01/12/20 0645 01/12/20 1105 01/13/20 0500  Weight: 80.9 kg 81.7 kg 91.2 kg    Examination: General: critically ill appearing male, intubated and mechanically ventilated HENT: [place]/AT, PERRL, EOMI, ET tube in place Lungs: clear to auscultation bilaterally Cardiovascular: RRR, S1 and S2 present, no m/r/g Abdomen: soft, nondistended, nontender, normoactive bowel sounds Extremities: minimal nonpitting edema noted in bilateral lower extremities Neuro: awake and agitated; following commands appropriately; PERRL; no focal deficits noted  Assessment & Plan:   Acute hypoxic respiratory failure with compromised airway   Suspect respiratory failure secondary to his acute encephalopathy of unclear etiology;  - Currently tolerating pressure support and encephalopathy is improved - Will extubate today and monitor for any respiratory compromise   Acute metabolic toxic encephalopathy: Suspect to be secondary to sepsis vs uremia.  CT Head and MRI brain negative for acute intracranial abnormalities; HD session yesterday. This morning, awake and agitated and following commands appropriately.  - Weaning precedex as tolerated for RASS goal 0   MRSA bacteremia  Epidural abscess s/p laminectomy: Afebrile and leukocytosis is stable.  - Continued on vancomycin and rifampin x 8 weeks total per ID   Metabolic, respiratory alkalosis: Suspect secondary to GI losses initially. No further output recorded via NG tube.  - Reglan 5mg  q8h   - Continue to monitor   ESRD on HD: No fluid removed with HD yesterday as patient dry on exam and 8-18kg under prior dry weight. - HD per nephrology  Anemia of chronic disease: Did have drop in Hb this AM from 9.3 to 7.5. No signs of active bleeding. Suspect secondary to acute illness and anemia of chronic disease.  - ESA per nephrology   Type 2 DM with hyperglycemia Patient's long acting insulin held overnight for CBG of 94.  No hypoglycemic episodes documented. CBG >250 this AM.  - Levemir 11U bid + SSI  - Goal CBG <180  Best practice:  Diet: tube feeds  Pain/Anxiety/Delirium protocol (if indicated): per protocol  VAP protocol (if indicated): per protocol  DVT prophylaxis: SCDs GI prophylaxis: PPI Glucose control: levemir + SSI Mobility: bed rest Code Status: FULL  Family Communication: patient's daughter, [name], updated via phone on 8/12 Disposition: ICU  Critical care time: 35 minutes     Harvie Heck, MD Internal Medicine, PGY-2 01/13/20 7:16 AM Pager # [phone]

## 2020-01-13 NOTE — Progress Notes (Signed)
[facility] Progress Note  Subjective: extubated this am, no new c/o.  Labs good.   Vitals:   01/13/20 1030 01/13/20 1045 01/13/20 1100 01/13/20 1115  BP: (!) 120/107 (!) 143/60 139/64 (!) 153/71  Pulse: 69 70 74 77  Resp: (!) 21 (!) '21 14 17  ' Temp:   98.7 F (37.1 C)   TempSrc:   Axillary   SpO2: 100% 100% 100% 100%  Weight:      Height:        Physical Exam General: on [place] O2  Heart: RRR; no murmur Lungs: CTA anteriorly Abdomen: soft, non-tender Extremities: no edema Neuro: groggy, nonfocal Dialysis Access: L AVF w/ infiltration, +bruit R IJ temp HD cath in place  Dialysis: [facility] MWF  4h 54mn  99kg  2/2 bath  RIJ TDC (removed)/ LUA AVF Hep none - Calcitriol 1.256m PO q HD - Mircera 150 mcg IV q 2 weeks (last given on 12/29/19)  Assessment/Plan: 1. Epidural abscess:  MRI showed abscess extending from C4-T2 with cord compression, seen by NSurg and went to OR for C4-T2 for decomp/ laminectomy/ fusion on 01/06/20 with improved pain.  2. MRSA bacteremia: Repeat BCx 8/6 negative. TTE 8/6 without vegetation. TDC removed 8/6, using LUE AVF until infiltrated 8/9, now using temp HD cath. IV vanc and rifampin per ID.  3. Resp failure - intubated 8/9, extubated this am 8/12.   4. Confusion: meds v. infection v. Uremia. Azotemia resolved so uremia should not be an issue.  5. ESRD: HD on MWF schedule. HD tomorrow, full Rx [id number] Will try to use AVF assuming pt cooperative.  6. HTN/volume: BP's wnl, 8 kg under dry wt. No fluid off next HD.  7. Anemia: Hgb 9.3 - due for ESA later this week. 8. Secondary hyperparathyroidism:  Phos high - started on Renvela [id number] on 8/8 - follow. 9. Nutrition: Alb very low - continue supplements as tol Ronnie Derby 01/13/2020, 11:46 AM       Objective Vitals:   01/11/20 0500 01/11/20 0600 01/11/20 0700 01/11/20 0715  BP: 136/62 131/62 (!) 125/59 111/60  Pulse: 85 85 82 83  Resp: '14 14 14 14  ' Temp:   99 F (37.2 C)    TempSrc:   Axillary   SpO2: 100% 100% 100% 100%  Weight: 90.3 kg     Height:              Rob Claudine Stallings 01/13/2020, 11:46 AM   Recent Labs  Lab 01/12/20 0403 01/12/20 0403 01/12/20 0412 01/12/20 1209 01/12/20 2029 01/13/20 0413  K 4.1   < >   < > 3.1* 3.8 3.9  BUN 92*   < >  --   --  48* 60*  CREATININE 9.15*   < >  --   --  5.25* 6.16*  CALCIUM 7.5*   < >  --   --  7.5* 7.5*  PHOS 9.7*  --   --   --   --  6.0*  HGB 8.0*  --    < > 9.2*  --  7.5*   < > = values in this interval not displayed.   Inpatient medications: . atorvastatin  20 mg Per Tube QHS  . chlorhexidine gluconate (MEDLINE KIT)  15 mL Mouth Rinse BID  . Chlorhexidine Gluconate Cloth  6 each Topical Q0600  . feeding supplement (PROSource TF)  45 mL Per Tube TID  . insulin aspart  0-9 Units Subcutaneous Q4H  . insulin  detemir  11 Units Subcutaneous BID  . metoCLOPramide  5 mg Per Tube Q8H  . metoprolol tartrate  25 mg Per Tube BID  . multivitamin  1 tablet Per Tube QHS  . pantoprazole (PROTONIX) IV  40 mg Intravenous Q24H  . QUEtiapine  12.5 mg Oral BID  . sodium chloride flush  10-40 mL Intracatheter Q12H   . sodium chloride 10 mL/hr at 01/13/20 1100  . dexmedetomidine (PRECEDEX) IV infusion Stopped (01/13/20 1019)  . feeding supplement (VITAL HIGH PROTEIN) 45 mL/hr at 01/13/20 1100  . phenylephrine (NEO-SYNEPHRINE) Adult infusion 6 mcg/min (01/13/20 1100)  . rifampin (RIFADIN) IVPB Stopped (01/13/20 9381)  . vancomycin Stopped (01/12/20 1106)   acetaminophen, clonazePAM, docusate, hydrALAZINE, HYDROcodone-acetaminophen, [DISCONTINUED] ondansetron **OR** ondansetron (ZOFRAN) IV, polyethylene glycol, senna-docusate, sodium chloride flush

## 2020-01-13 NOTE — Progress Notes (Signed)
Nutrition Follow-up  DOCUMENTATION CODES:   Non-severe (moderate) malnutrition in context of chronic illness  INTERVENTION:   - Continue renal MVI daily  - Nepro Shake po BID, each supplement provides 425 kcal and 19 grams protein  - ProSource Plus 30 ml po BID, each supplement provides 100 kcal and 15 grams of protein  NUTRITION DIAGNOSIS:   Moderate Malnutrition related to chronic illness (ESRD on HD) as evidenced by moderate fat depletion, moderate muscle depletion.  New diagnosis after completion of NFPE  GOAL:   Patient will meet greater than or equal to 90% of their needs  Progressing  MONITOR:   PO intake, Supplement acceptance, Labs, Weight trends, I & O's  REASON FOR ASSESSMENT:   Ventilator, Consult Enteral/tube feeding initiation and management  ASSESSMENT:   69 yo male admitted with cervical epidural abscess with MRSA bacteremia. S/P laminectomy and fusion on 8/5. PMH includes ESRD on HD, HTN, HLD, OSA, DM2, smoker, cocaine and alcohol use.   8/09 - intubated, transferred to ICU 8/12 - extubated, diet advanced to renal/carb modified with 1200 ml fluid restriction  Noted plan for HD tomorrow. Pt is now 8 kg under dry weight of 99 kg. No fluid off at next HD.  Spoke with pt at bedside. RN in room providing nursing care. Pt confused and stating he does not know where he is. Pt requesting a drink of water.  RD will order oral nutrition supplements to aid pt in meeting kcal and protein needs during admission.  Medications reviewed and include: SSI q 4 hours, Levemir 11 units BID, Reglan 5 mg q 8 hours, rena-vit, protonix, levophed, IV abx  Labs reviewed: sodium 134, phosphorus 6.0, hemoglobin 7.5 CBG's: 94-257 x 24 hours  NUTRITION - FOCUSED PHYSICAL EXAM:    Most Recent Value  Orbital Region Mild depletion  Upper Arm Region Moderate depletion  Thoracic and Lumbar Region Moderate depletion  Buccal Region Moderate depletion  Temple Region Moderate  depletion  Clavicle Bone Region Moderate depletion  Clavicle and Acromion Bone Region Moderate depletion  Scapular Bone Region Unable to assess  Dorsal Hand Mild depletion  Patellar Region Moderate depletion  Anterior Thigh Region Moderate depletion  Posterior Calf Region Moderate depletion  Edema (RD Assessment) None  Hair Reviewed  Eyes Reviewed  Mouth Reviewed  Skin Reviewed  Nails Reviewed       Diet Order:   Diet Order            Diet renal/carb modified with fluid restriction Diet-HS Snack? Nothing; Fluid restriction: 1200 mL Fluid; Room service appropriate? Yes; Fluid consistency: Thin  Diet effective now                 EDUCATION NEEDS:   Not appropriate for education at this time  Skin:  Skin Assessment: Reviewed RN Assessment  Last BM:  01/13/20  Height:   Ht Readings from Last 1 Encounters:  01/06/20 5' 11.5" (1.816 m)    Weight:   Wt Readings from Last 1 Encounters:  01/13/20 91.2 kg    Ideal Body Weight:  79.5 kg  BMI:  Body mass index is 27.65 kg/m.  Estimated Nutritional Needs:   Kcal:  2200-2400  Protein:  115-150 grams  Fluid:  1 L + UOP    Gaynell Face, MS, RD, LDN Inpatient Clinical Dietitian Please see AMiON for contact information.

## 2020-01-14 DIAGNOSIS — J9601 Acute respiratory failure with hypoxia: Secondary | ICD-10-CM | POA: Diagnosis not present

## 2020-01-14 LAB — RENAL FUNCTION PANEL
Albumin: 1.9 g/dL — ABNORMAL LOW (ref 3.5–5.0)
Anion gap: 15 (ref 5–15)
BUN: 83 mg/dL — ABNORMAL HIGH (ref 8–23)
CO2: 25 mmol/L (ref 22–32)
Calcium: 7.7 mg/dL — ABNORMAL LOW (ref 8.9–10.3)
Chloride: 91 mmol/L — ABNORMAL LOW (ref 98–111)
Creatinine, Ser: 8.35 mg/dL — ABNORMAL HIGH (ref 0.61–1.24)
GFR calc Af Amer: 7 mL/min — ABNORMAL LOW (ref >60)
GFR calc non Af Amer: 6 mL/min — ABNORMAL LOW (ref >60)
Glucose, Bld: 112 mg/dL — ABNORMAL HIGH (ref 70–99)
Phosphorus: 6.8 mg/dL — ABNORMAL HIGH (ref 2.5–4.6)
Potassium: 3.7 mmol/L (ref 3.5–5.1)
Sodium: 131 mmol/L — ABNORMAL LOW (ref 135–145)

## 2020-01-14 LAB — CBC
HCT: 22.6 % — ABNORMAL LOW (ref 39.0–52.0)
Hemoglobin: 7.5 g/dL — ABNORMAL LOW (ref 13.0–17.0)
MCH: 26.5 pg (ref 26.0–34.0)
MCHC: 33.2 g/dL (ref 30.0–36.0)
MCV: 79.9 fL — ABNORMAL LOW (ref 80.0–100.0)
Platelets: 334 10*3/uL (ref 150–400)
RBC: 2.83 MIL/uL — ABNORMAL LOW (ref 4.22–5.81)
RDW: 17.6 % — ABNORMAL HIGH (ref 11.5–15.5)
WBC: 27 10*3/uL — ABNORMAL HIGH (ref 4.0–10.5)
nRBC: 0.1 % (ref 0.0–0.2)

## 2020-01-14 LAB — GLUCOSE, CAPILLARY
Glucose-Capillary: 99 mg/dL (ref 70–99)
Glucose-Capillary: 104 mg/dL — ABNORMAL HIGH (ref 70–99)
Glucose-Capillary: 184 mg/dL — ABNORMAL HIGH (ref 70–99)
Glucose-Capillary: 102 mg/dL — ABNORMAL HIGH (ref 70–99)

## 2020-01-14 LAB — HEPATITIS C ANTIBODY: HCV Ab: REACTIVE — AB

## 2020-01-14 LAB — SEDIMENTATION RATE: Sed Rate: 115 mm/hr — ABNORMAL HIGH (ref 0–16)

## 2020-01-14 LAB — C-REACTIVE PROTEIN: CRP: 17.9 mg/dL — ABNORMAL HIGH (ref <1.0)

## 2020-01-14 MED ORDER — VANCOMYCIN HCL IN DEXTROSE 750-5 MG/150ML-% IV SOLN
INTRAVENOUS | Status: AC
  Administered 2020-01-14: 750 mg via INTRAVENOUS
  Filled 2020-01-14: qty 150

## 2020-01-14 MED ORDER — CHLORHEXIDINE GLUCONATE 0.12 % MT SOLN
OROMUCOSAL | Status: AC
  Administered 2020-01-14: 15 mL
  Filled 2020-01-14: qty 15

## 2020-01-14 MED ORDER — CHLORHEXIDINE GLUCONATE CLOTH 2 % EX PADS
6.0000 | MEDICATED_PAD | Freq: Every day | CUTANEOUS | Status: DC
  Administered 2020-01-14 – 2020-01-18 (×4): 6 via TOPICAL

## 2020-01-14 MED ORDER — HEPARIN SODIUM (PORCINE) 1000 UNIT/ML IJ SOLN
INTRAMUSCULAR | Status: AC
  Administered 2020-01-14: 2400 [IU]
  Filled 2020-01-14: qty 3

## 2020-01-14 MED ORDER — HYDROCODONE-ACETAMINOPHEN 5-325 MG PO TABS
1.0000 | ORAL_TABLET | Freq: Four times a day (QID) | ORAL | Status: DC
  Administered 2020-01-14 – 2020-01-24 (×18): 1 via ORAL
  Filled 2020-01-14 (×22): qty 1

## 2020-01-14 MED ORDER — RIFAMPIN 300 MG PO CAPS
300.0000 mg | ORAL_CAPSULE | Freq: Two times a day (BID) | ORAL | Status: DC
  Administered 2020-01-14 – 2020-01-24 (×12): 300 mg via ORAL
  Filled 2020-01-14 (×24): qty 1

## 2020-01-14 MED ORDER — ENOXAPARIN SODIUM 30 MG/0.3ML ~~LOC~~ SOLN
30.0000 mg | SUBCUTANEOUS | Status: DC
  Administered 2020-01-14 – 2020-01-18 (×4): 30 mg via SUBCUTANEOUS
  Filled 2020-01-14 (×7): qty 0.3

## 2020-01-14 NOTE — Progress Notes (Signed)
WBC level trending up (22.3 on 08/12 & 27.0 on 08/13). [place] and Dr. Lucile Shutters made aware. No further orders at this time.
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
## 2020-01-14 NOTE — Progress Notes (Signed)
NAME:  [name]., MRN:  [id number], DOB:  Jul 04, 1950, LOS: 105 ADMISSION DATE:  01/04/2020, CONSULTATION DATE:  01/10/2020 REFERRING MD:  Jonnie Finner, CHIEF COMPLAINT:  AMS   Brief History   69 year old male with PMHx of ESRD admitted on 8/3 with altered mental status found to have large cervicothoracic dorsal cervical epidural abscess. Underwent laminectomy and drainage with improved mental status. However, noted to have acutely worsened mental status prior to initiation of HD on 8/9 for which PCCM consulted.   Past Medical History   Past Medical History:  Diagnosis Date  . Anemia   . Arthritis    left knee  . Chronic kidney disease    acute renal failure/injury requiring short-term HD 2013  . Colon cancer ([place])   . Colon polyp    11/2011 - Polyps identified, biopsy - invasive adenocarinoma  . DM II (diabetes mellitus, type II), controlled ([place])    type 2 IDDM x 15 years. A1C 1/09 13.7.   Marland Kitchen 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 12/25/2011  . GERD (gastroesophageal reflux disease)   . Heart murmur   . 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 Hyperlipidemia   . Hypertension    for a few years and resolved in 2013/2014.  . Sleep apnea    does not wear CPAP now  . Wears glasses    Significant Hospital Events   8/3> admitted 8/5> cervical and thoracic laminectomy 8/9 > PCCM consulted for AMS and patient transferred to ICU  8/10 > intubated for worsening mental and respiratory status  8/12 > extubated   Consults:  PCCM ID Nephrology Neurosurgery  Procedures:  Cervical and thoracic laminectomy 8/5 R IJ HD catheter 8/9>  ETT 8/10> 8/12  Significant Diagnostic Tests:  8/3 MRI c spine: 1. Abnormal fluid in the dorsal spinal canal extending from foramen magnum to the T2 level. Given the other inflammatory changes and concern, this most likely represents epidural infection. There is some fluid ventral to the spinal cord at C2. 2. Diffuse prevertebral edema from the clivus  to C6 is also concerning for infection. 3. The cord is compressed without abnormal signal suggesting edema. 4. Mild edema in the posterior paraspinous muscles, left greater than right. Edema is most significant within the paraspinous muscles just below the occiput. No focal abscess is present. 5. Severe disc disease at C5-6 and C6-7 with moderate central canal stenosis at both levels. 6. Mild disc disease at C3-4 and C4-5 with some cord compression at these levels. 7. Abnormal marrow signal in the lower cervical spine, particularly C7 and T1. This is nonspecific and can be seen in the setting of anemia, smoking, or obesity. 8/4 MRI thoracic: 1. Dorsal epidural fluid collection extending from the cervical spine to T2 level, consistent with epidural abscess. No continuation below the T2 level. 2. No thoracic spinal canal or neural foraminal stenosis. 8/5 MRI lumbar: 1. There is what appears to be a subdural fluid collection at the posterior left quadrant extending from T12-L1 to L2-3, with maximal diameter on the axial images 7 x 11 mm in cross-sectional diameter. This causes mass effect upon the neural structures, crowding them anteriorly. Additionally, there is probably some epidural material posteriorly at L3 to L4. This could be a small subdural component of this as well. This could be infected material. 2. 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 There is 2 mm of anterolisthesis at L2-3, L3-4 and L4-5. There is disc degeneration with  bulging of the discs. There is moderate to severe multifactorial spinal stenosis at L3-4 and severe spinal stenosis at L4-5 that could cause neural compression. Facet joint edema at L2-3, L3-4 and L4-5 could be degenerative or potentially relate to infection. The single most suspicious looking facet joint is on the right at L3-4. Additionally, the posterior para spinous soft tissue structures show edematous  change which could be inflammatory. 8/6 echo: LVEF 32%, grade 2 diastolic dysfunction, trival MR and TR 8/10 CT Head:1. No acute intracranial abnormality. Stable thalamic small vessel disease. 2. Continued abnormal upper cervical epidural space. Partially visible cervical posterior midline postoperative changes. 8/10 MRI Head: No evidence of recent infarction, hemorrhage, mass. Stable chronic microhemorrhages and microvascular ischemic changes   Micro Data:  8/3 blood: MRSA 8/3 urine: neg 8/5 intraop: MRSA 8/6 blood: negative 8/9 MRSA negative   Antimicrobials:  Vancomycin 8/4>8/6, 8/9> Rifampin 8/9>   Interim history/subjective:  Overnight, no acute events reported. Patient evaluated on HD this morning. No acute concerns at this time. He denies any pain.   Objective   Blood pressure (!) 169/63, pulse 80, temperature 98.6 F (37 C), temperature source Oral, resp. rate (!) 23, height 5' 11.5" (1.816 m), weight 96.4 kg, SpO2 100 %.    Vent Mode: PSV;CPAP FiO2 (%):  [40 %] 40 % PEEP:  [5 cmH20] 5 cmH20 Pressure Support:  [12 cmH20] 12 cmH20   Intake/Output Summary (Last 24 hours) at 01/14/2020 0715 Last data filed at 01/14/2020 0035 Gross per 24 hour  Intake 1948.17 ml  Output 5 ml  Net 1943.17 ml   Filed Weights   01/12/20 1105 01/13/20 0500 01/14/20 0338  Weight: 81.7 kg 91.2 kg 96.4 kg    Examination: General: critically ill appearing male, intubated and mechanically ventilated HENT: [place]/AT, PERRL, EOMI, ET tube in place Lungs: clear to auscultation bilaterally Cardiovascular: RRR, S1 and S2 present, no m/r/g Abdomen: soft, nondistended, nontender, normoactive bowel sounds Extremities: minimal nonpitting edema noted in bilateral lower extremities Neuro: awake and agitated; following commands appropriately; PERRL; no focal deficits noted  Assessment & Plan:  Acute hypoxic respiratory failure with compromised airway secondary to acute encephalopathy This has  resolved. Patient extubated on 8/12. Able to protect airway.   Acute metabolic toxic encephalopathy: CT Head and MRI brain negative for acute intracranial abnormalities; Suspect to be secondary to sepsis vs uremia. He had HD sessions per MWF schedule with improvement in mental status. He is off precedex gtt. Awake, alert and coherent. No apparent focal deficits noted on exam.  - Continue HD MWF per nephrology - Continue vancomycin and rifampin for MRSA bacteremia per ID   MRSA bacteremia  Epidural abscess s/p laminectomy: Slightly increased leukocytosis but remains afebrile. Elevated inflammatory markers.  - Continued on vancomycin and rifampin x 8 weeks total per ID   ESRD on HD: - HD per nephrology  Anemia of chronic disease: Stable at 7.5. No signs of active bleeding.  Suspect secondary to acute illness and anemia of chronic disease.  - ESA per nephrology  - F/u CBC  Type 2 DM with hyperglycemia - Levemir 11U bid + SSI  - Goal CBG <160  Best practice:  Diet: CM/Renal  Pain/Anxiety/Delirium protocol (if indicated): per protocol  VAP protocol (if indicated): n/a DVT prophylaxis: SCDs GI prophylaxis: PPI Glucose control: levemir + SSI Mobility: OOB as tolerated with PT/OT  Code Status: FULL  Family Communication: patient's daughter, Adrean Heitz, updated via phone on 8/13 Disposition: Progressive; discussed with Dr. Louanne Belton of  [facility]. [facility] to assume care on 8/14.   Critical care time: 35 minutes     Harvie Heck, MD Internal Medicine, PGY-2 01/14/20 7:15 AM Pager # [phone]

## 2020-01-14 NOTE — Progress Notes (Signed)
Pharmacy Antibiotic Note  [name]. is a 69 y.o. male admitted on 01/04/2020 with MRSA bacteremia/epidural abscess of C spine. Pharmacy has been consulted for vancomycin dosing. Patient is ESRD with HD on MWF.   8/10 Vanc random = 22, which is at goal 15-25. Patient is afebrile, WBC 27. Completed full HD session today 8/13. No changes to vancomycin dosing needed. Abx LOT is 8 weeks.   Plan: - Continue vancomycin 750mg  IV MWF after HD - Draw vanc level on Monday 8/16 - Monitor clinical progress and HD schedule  Height: 5' 11.5" (181.6 cm) Weight: 96.4 kg (212 lb 8.4 oz) IBW/kg (Calculated) : 76.45  Temp (24hrs), Avg:98 F (36.7 C), Min:97.5 F (36.4 C), Max:98.6 F (37 C)  Recent Labs  Lab 01/10/20 0230 01/10/20 0230 01/11/20 0202 01/11/20 0818 01/11/20 1140 01/12/20 0403 01/12/20 2029 01/13/20 0413 01/14/20 0330  WBC 19.9*  --   --  20.2*  --  20.5*  --  22.3* 27.0*  CREATININE 12.16*   < > 6.11*  --   --  9.15* 5.25* 6.16* 8.35*  VANCORANDOM  --   --   --   --  22  --   --   --   --    < > = values in this interval not displayed.    Estimated Creatinine Clearance: 10.1 mL/min (A) (by C-G formula based on SCr of 8.35 mg/dL (H)).    No Known Allergies  Antimicrobials this admission: vancomycin 8/4 >>   Dose adjustments this admission:  Microbiology results: 8/3 BCx: 4/4 MRSA 8/3 UCx: No growth  8/5 Wound: MRSA 8/6 Repeat BCx: No growth <12 h  Thank you for allowing pharmacy to be a part of this patient's care.  Mercy Riding, PharmD PGY1 Acute Care Pharmacy Resident Please refer to [facility] for unit-specific pharmacist

## 2020-01-14 NOTE — Procedures (Signed)
   I was present at this dialysis session, have reviewed the session itself and made  appropriate changes Kelly Splinter MD [place] pager [phone]   01/14/2020, 11:07 AM

## 2020-01-14 NOTE — Progress Notes (Signed)
Hemdialysis tx complete-Unable to access AV fistula however unable to run due to clots aspirated from lines, attempts made x3. Pt restless throughout tx, agitated at times. Requiring frequent redirection and emotional support. Bolus NS 530ml given per Dr. Jonnie Finner due to patient being significantly below dry weight.

## 2020-01-14 NOTE — Progress Notes (Signed)
[facility] Progress Note  Subjective: restless but O x 3  Vitals:   01/14/20 1000 01/14/20 1015 01/14/20 1030 01/14/20 1045  BP: (!) 156/59 (!) 148/84 (!) 181/61 (!) 181/58  Pulse: 83 88 88 93  Resp: (!) 21 (!) 23 (!) 24 (!) 23  Temp:      TempSrc:      SpO2: 100% 100% 100% 98%  Weight:      Height:        Physical Exam General: on [place] O2 , dry mouth slightly Heart: RRR; no murmur Lungs: CTA anteriorly Abdomen: soft, non-tender Extremities: no edema Neuro: groggy, nonfocal Dialysis Access: L AVF w/ infiltration, +bruit R IJ temp HD cath in place  Dialysis: [facility] MWF  4h 52mn  99kg  2/2 bath  RIJ TDC (removed)/ LUA AVF Hep none - Calcitriol 1.252m PO q HD - Mircera 150 mcg IV q 2 weeks (last given on 12/29/19)  Assessment/Plan: 1. Epidural abscess:  MRI showed abscess extending from C4-T2 with cord compression, seen by NSurg and went to OR for C4-T2 for decomp/ laminectomy/ fusion on 01/06/20 with improved pain.  2. MRSA bacteremia: Repeat BCx 8/6 negative. TTE 8/6 without vegetation. TDC removed 8/6, using LUE AVF until infiltrated 8/9, now using temp HD cath. IV vanc and rifampin per ID.  3. Resp failure - intubated 8/9 - 8/12.  Off vent and stable 4. Confusion: meds v. infection v. Uremia. Seems better. Azotemia resolved so uremia should not be an issue. He is oriented x 3.  5. ESRD: HD on MWF schedule. HD today, full Rx [id number]   6. Infiltrated AVF: unable to access today w/ 3 attempts to stick. Using temp cath for now. May need eval, fistulogram next week.  7. HTN/volume: BP's wnl, 2-3 kg under dry, no vol^ on exam. No UF today 8. Anemia: Hgb 9.3 - due for ESA later this week. 9. Secondary hyperparathyroidism:  Phos high - started on Renvela [id number] on 8/8 - follow. 10. Nutrition: Alb very low - continue supplements as tol Ronnie Derby 01/14/2020, 11:01 AM       Objective Vitals:   01/11/20 0500 01/11/20 0600 01/11/20 0700 01/11/20 0715   BP: 136/62 131/62 (!) 125/59 111/60  Pulse: 85 85 82 83  Resp: _0 Temp:   99 F (37.2 C)   TempSrc:   Axillary   SpO2: 100% 100% 100% 100%  Weight: 90.3 kg     Height:              Rob Riannon Mukherjee 01/14/2020, 11:01 AM   Recent Labs  Lab 01/13/20 0413 01/14/20 0330  K 3.9 3.7  BUN 60* 83*  CREATININE 6.16* 8.35*  CALCIUM 7.5* 7.7*  PHOS 6.0* 6.8*  HGB 7.5* 7.5*   Inpatient medications: . (feeding supplement) PROSource Plus  30 mL Oral BID BM  . atorvastatin  20 mg Oral QHS  . chlorhexidine gluconate (MEDLINE KIT)  15 mL Mouth Rinse BID  . Chlorhexidine Gluconate Cloth  6 each Topical Q0600  . feeding supplement (NEPRO CARB STEADY)  237 mL Oral BID BM  . HYDROcodone-acetaminophen  1 tablet Oral Q6H  . insulin aspart  0-9 Units Subcutaneous Q4H  . insulin detemir  11 Units Subcutaneous BID  . metoprolol tartrate  25 mg Oral BID  . multivitamin  1 tablet Oral QHS  . pantoprazole (PROTONIX) IV  40 mg Intravenous Q24H  . QUEtiapine  12.5 mg Oral BID  .  rifampin  300 mg Oral Q12H  . sodium chloride flush  10-40 mL Intracatheter Q12H   . sodium chloride 10 mL/hr at 01/13/20 1700  . phenylephrine (NEO-SYNEPHRINE) Adult infusion Stopped (01/13/20 1118)  . vancomycin 750 mg (01/14/20 0959)   acetaminophen, clonazePAM, hydrALAZINE, [DISCONTINUED] ondansetron **OR** ondansetron (ZOFRAN) IV, polyethylene glycol, senna-docusate, sodium chloride flush

## 2020-01-14 NOTE — Plan of Care (Signed)
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
## 2020-01-14 NOTE — Progress Notes (Signed)
[facility] for Infectious Disease    Date of Admission:  01/04/2020   Total days of antibiotics 10        day 5 RIF (Day 8 since negative BCx)           ID: [name]. is a 69 y.o. male with mrsa bacteremia with cervical spine epidural abscess s/p debridement and HW placement  Principal Problem:   MRSA bacteremia Active Problems:   TOBACCO ABUSE   OBSTRUCTIVE SLEEP APNEA   Colon cancer, status Post resection    HYPERTENSION   ESRD (end stage renal disease) ([place])   Epidural abscess   Acute encephalopathy   Sepsis ([place])   Bacteremia associated with intravascular line ([place])   Hemodialysis patient ([place])    Subjective: Feeling OK. Some mild abdominal pain and worried he has not pooped  No right shoulder pain today. Still with some restricted motion turning head to the right and some neck pain from surgery.  AFebrile WBC 27 today --> trending up since 8/07     Medications:  . (feeding supplement) PROSource Plus  30 mL Oral BID BM  . atorvastatin  20 mg Oral QHS  . chlorhexidine gluconate (MEDLINE KIT)  15 mL Mouth Rinse BID  . Chlorhexidine Gluconate Cloth  6 each Topical Q0600  . feeding supplement (NEPRO CARB STEADY)  237 mL Oral BID BM  . insulin aspart  0-9 Units Subcutaneous Q4H  . insulin detemir  11 Units Subcutaneous BID  . metoprolol tartrate  25 mg Oral BID  . multivitamin  1 tablet Oral QHS  . pantoprazole (PROTONIX) IV  40 mg Intravenous Q24H  . QUEtiapine  12.5 mg Oral BID  . sodium chloride flush  10-40 mL Intracatheter Q12H    Objective: Vital signs in last 24 hours: Temp:  [97.5 F (36.4 C)-98.7 F (37.1 C)] 97.8 F (36.6 C) (08/13 0805) Pulse Rate:  [69-97] 87 (08/13 0945) Resp:  [14-28] 19 (08/13 0945) BP: (102-172)/(51-115) 154/69 (08/13 0945) SpO2:  [92 %-100 %] 100 % (08/13 0945) Weight:  [96.4 kg] 96.4 kg (08/13 0338)   Physical Exam  Constitutional:  HENT: eyes equal round and reactive.  Neck:right ij/HD line clean,  better ROM laterally to the right today but still restricted  Cardiovascular: Normal rate, regular rhythm and normal heart sounds. Exam reveals no gallop and no friction rub. No murmur heard.  Pulmonary/Chest: Effort normal and breath sounds normal. No respiratory distress. He has no wheezes. Abdominal: Soft. Bowel sounds are decreased. He exhibits no distension. There is no tenderness.  Musculoskeletal: He has full active unrestricted ROM today on exam. No tenderness. Knees b/l are unremarkable.  Neurological: He is alert and oriented to person, place, and time.  Skin: Skin is warm and dry. No rash noted. No erythema.     Lab Results Recent Labs    01/13/20 0413 01/14/20 0330  WBC 22.3* 27.0*  HGB 7.5* 7.5*  HCT 22.7* 22.6*  NA 134* 131*  K 3.9 3.7  CL 94* 91*  CO2 26 25  BUN 60* 83*  CREATININE 6.16* 8.35*   Liver Panel Recent Labs    01/13/20 0413 01/14/20 0330  PROT 6.5  --   ALBUMIN 1.8* 1.9*  AST 30  --   ALT 20  --   ALKPHOS 77  --   BILITOT 2.3*  --    Microbiology: 8/5 tissue MRSA Studies/Results: No results found.   Assessment/Plan: Cervical epidural abscess s/p debridement & Fusion =  Site looks clean and no drainage from incision. Has a hard time turning neck to the right d/t pain. ?muscle spasms.  Will treat for 8 wk through October 1 with vancomycin with HD, plus rifampin PO BID (Change to PO now). He will need suppression with oral antibiotics given hardware placed in the setting of infection   MRSA Bacteremia = secondary to #1. TTE was negative and no findings of significant valvular insufficiency/stenosis. No changes on cardiac telemetry. R shoulder joint appears fine today with full active ROM and no tenderness. Still has some c-spine tenderness and difficulty turning to the right - would expect some pain given surgery.   Leukocytosis = unclear. R IJ looks good. Remains afebrile and no other sources of infection identified.   Abd pain = non tender  on exam. Had BM yesterday and day prior per chart records. He has a poor appetite. (May be nauseated with Rif - will have to see how he does with PO).   Respiratory Failure = resolved    Long term medication management = would check LFT weekly when on rifampin. ESR / CRP weekly.    [name], MSN, NP-C [facility] for Infectious Disease [email or web address] Pager: [phone] Office: [phone] [place]: [phone]

## 2020-01-15 LAB — RENAL FUNCTION PANEL
Albumin: 1.7 g/dL — ABNORMAL LOW (ref 3.5–5.0)
Anion gap: 12 (ref 5–15)
BUN: 38 mg/dL — ABNORMAL HIGH (ref 8–23)
CO2: 25 mmol/L (ref 22–32)
Calcium: 7.7 mg/dL — ABNORMAL LOW (ref 8.9–10.3)
Chloride: 97 mmol/L — ABNORMAL LOW (ref 98–111)
Creatinine, Ser: 5.78 mg/dL — ABNORMAL HIGH (ref 0.61–1.24)
GFR calc Af Amer: 11 mL/min — ABNORMAL LOW (ref >60)
GFR calc non Af Amer: 9 mL/min — ABNORMAL LOW (ref >60)
Glucose, Bld: 77 mg/dL (ref 70–99)
Phosphorus: 5.2 mg/dL — ABNORMAL HIGH (ref 2.5–4.6)
Potassium: 4.2 mmol/L (ref 3.5–5.1)
Sodium: 134 mmol/L — ABNORMAL LOW (ref 135–145)

## 2020-01-15 LAB — GLUCOSE, CAPILLARY
Glucose-Capillary: 104 mg/dL — ABNORMAL HIGH (ref 70–99)
Glucose-Capillary: 126 mg/dL — ABNORMAL HIGH (ref 70–99)
Glucose-Capillary: 144 mg/dL — ABNORMAL HIGH (ref 70–99)
Glucose-Capillary: 154 mg/dL — ABNORMAL HIGH (ref 70–99)
Glucose-Capillary: 71 mg/dL (ref 70–99)
Glucose-Capillary: 73 mg/dL (ref 70–99)

## 2020-01-15 LAB — CBC
HCT: 21.8 % — ABNORMAL LOW (ref 39.0–52.0)
Hemoglobin: 7.2 g/dL — ABNORMAL LOW (ref 13.0–17.0)
MCH: 27.1 pg (ref 26.0–34.0)
MCHC: 33 g/dL (ref 30.0–36.0)
MCV: 82 fL (ref 80.0–100.0)
Platelets: 228 10*3/uL (ref 150–400)
RBC: 2.66 MIL/uL — ABNORMAL LOW (ref 4.22–5.81)
RDW: 18 % — ABNORMAL HIGH (ref 11.5–15.5)
WBC: 13 10*3/uL — ABNORMAL HIGH (ref 4.0–10.5)
nRBC: 0 % (ref 0.0–0.2)

## 2020-01-15 MED ORDER — DARBEPOETIN ALFA 100 MCG/0.5ML IJ SOSY
100.0000 ug | PREFILLED_SYRINGE | INTRAMUSCULAR | Status: DC
  Filled 2020-01-15 (×2): qty 0.5

## 2020-01-15 NOTE — Plan of Care (Signed)

## 2020-01-15 NOTE — Progress Notes (Signed)
[facility] Progress Note   Subjective:   Patient seen in room. Currently alert and oriented but distressed that he does not remember events of the past few days. Reports poor appetite this morning. Denies SOB, CP, palpitations, dizziness, abdominal pain, N/V/D.   Objective Vitals:   01/15/20 0013 01/15/20 0412 01/15/20 0412 01/15/20 0813  BP: (!) 135/59 (!) 138/53  (!) 145/61  Pulse: 73 80  84  Resp:    17  Temp: 98 F (36.7 C) 99.4 F (37.4 C)  98.6 F (37 C)  TempSrc: Oral Oral  Oral  SpO2: 100% 100% 100% 98%  Weight:      Height:       Physical Exam General: Well developed male, alert and in NAD. Eating ice chips Heart: RRR, no murmurs, rubs or gallops Lungs: CTA bilaterally without wheezing, rhonchi or rales Abdomen: Soft, non-tender, non-distended. +BS Extremities: No edema b/l lower extremities Dialysis Access: L AVF w/ infiltration, +bruit, R IJ temp HD cath in place  Additional Objective Labs: Basic Metabolic Panel: Recent Labs  Lab 01/13/20 0413 01/14/20 0330 01/15/20 0410  NA 134* 131* 134*  K 3.9 3.7 4.2  CL 94* 91* 97*  CO2 26 25 25  GLUCOSE 245* 112* 77  BUN 60* 83* 38*  CREATININE 6.16* 8.35* 5.78*  CALCIUM 7.5* 7.7* 7.7*  PHOS 6.0* 6.8* 5.2*   Liver Function Tests: Recent Labs  Lab 01/10/20 1355 01/11/20 0202 01/13/20 0413 01/14/20 0330 01/15/20 0410  AST 27  --  30  --   --   ALT 24  --  20  --   --   ALKPHOS 98  --  77  --   --   BILITOT 2.5*  --  2.3*  --   --   PROT 7.6  --  6.5  --   --   ALBUMIN 1.9*   < > 1.8* 1.9* 1.7*   < > = values in this interval not displayed.   CBC: Recent Labs  Lab 01/11/20 0818 01/11/20 0852 01/12/20 0403 01/12/20 0412 01/13/20 0413 01/14/20 0330 01/15/20 0410  WBC 20.2*   < > 20.5*   < > 22.3* 27.0* 13.0*  HGB 8.7*   < > 8.0*   < > 7.5* 7.5* 7.2*  HCT 25.6*   < > 24.0*   < > 22.7* 22.6* 21.8*  MCV 76.2*  --  78.9*  --  81.4 79.9* 82.0  PLT 290   < > 343   < > 271 334 228   <  > = values in this interval not displayed.   Blood Culture    Component Value Date/Time   SDES BLOOD RIGHT HAND 01/07/2020 1926   SPECREQUEST  01/07/2020 1926    BOTTLES DRAWN AEROBIC AND ANAEROBIC Blood Culture adequate volume   CULT  01/07/2020 1926    NO GROWTH 5 DAYS Performed at [facility], [address] [place], [place] [address]    REPTSTATUS 01/12/2020 FINAL 01/07/2020 1926    CBG: Recent Labs  Lab 01/14/20 1645 01/14/20 2025 01/15/20 0014 01/15/20 0413 01/15/20 0811  GLUCAP 184* 102* 104* 73 71   Medications: . sodium chloride 10 mL/hr at 01/13/20 1700  . vancomycin Stopped (01/14/20 1151)   . (feeding supplement) PROSource Plus  30 mL Oral BID BM  . atorvastatin  20 mg Oral QHS  . chlorhexidine gluconate (MEDLINE KIT)  15 mL Mouth Rinse BID  . Chlorhexidine Gluconate Cloth  6 each Topical   Q0600  . enoxaparin (LOVENOX) injection  30 mg Subcutaneous Q24H  . feeding supplement (NEPRO CARB STEADY)  237 mL Oral BID BM  . HYDROcodone-acetaminophen  1 tablet Oral Q6H  . insulin aspart  0-9 Units Subcutaneous Q4H  . insulin detemir  11 Units Subcutaneous BID  . metoprolol tartrate  25 mg Oral BID  . multivitamin  1 tablet Oral QHS  . rifampin  300 mg Oral Q12H  . sodium chloride flush  10-40 mL Intracatheter Q12H    Dialysis Orders: [facility] MWF  4h 15min  99kg  2/2 bath  RIJ TDC (removed)/ LUA AVF Hep none - Calcitriol 1.25mcg PO q HD - Mircera 150 mcg IV q 2 weeks (last given on 12/29/19)  Assessment/Plan: 1. Epidural abscess:  MRI showed abscess extending from C4-T2 with cord compression, seen by NSurg and went to OR for C4-T2 for decomp/ laminectomy/ fusion on 01/06/20 with improved pain.  2. MRSA bacteremia: Repeat BCx 8/6 negative. TTE 8/6 without vegetation. TDC removed 8/6, using LUE AVF until infiltrated 8/9, now using temp HD cath. IV vanc and rifampin per ID.  3. Resp failure - intubated 8/9 - 8/12.  Off vent and stable 4. Confusion: meds v.  infection v. Uremia. Seems improved but still demonstrates short term memory loss. Azotemia resolved so uremia should not be an issue.  5. ESRD: HD on MWF schedule. Completed HD 8/13 and received 500cc IVF. Next HD 8/16 6. Infiltrated AVF: unable to access today w/ 3 attempts to stick. Using temp cath for now. May need eval/ fistulogram next week.  7. HTN/volume: BP's wnl, 2-3 kg under dry, no volume excess on exam. No UF today 8. Anemia: Hgb 7.2. Due for ESA, will order aranesp with next dialysis- increased dose. 9. Secondary hyperparathyroidism:  Phos high - started on Renvela 1600mg TID on 8/8, phos trending down to 5.2.      , PA-C 01/15/2020, 11:07 AM  [facility] Pager: [phone]   

## 2020-01-15 NOTE — Progress Notes (Signed)
Progress Note    [name].  GEZ:[id number] DOB: [birth year]  DOA: 01/04/2020 PCP: [facility]    Brief Narrative:    Medical records reviewed and are as summarized below:  Rodney Duwan Adrian. is an 69 y.o. male with history of ESRD on hemodialysis on Monday Wednesday Friday, hypertension, diabetes mellitus type 2, anemia history of colon cancer status post resection has been having increasing headache back pain over the last week or so.  Patient had come to the ER about 7 days ago with headache at that time CT head was unremarkable and was discharged on carbamazepine for possible trigeminal neuralgia.  Since then patient has become more weak and confused but has not taken his medication which was prescribed.  Patient also was unable to get out of the tub on Saturday that is about 3 days ago.  Since patient has been worsening pain patient was brought to the ER again.  Pain is mostly in the head and upper back and neck.  Per patient's daughter patient also was finding it difficult to ambulate because of the weakness.  Assessment/Plan:   Principal Problem:   MRSA bacteremia Active Problems:   TOBACCO ABUSE   OBSTRUCTIVE SLEEP APNEA   Colon cancer, status Post resection    HYPERTENSION   ESRD (end stage renal disease) ([place])   Epidural abscess   Acute encephalopathy   Sepsis ([place])   Bacteremia associated with intravascular line ([place])   Hemodialysis patient ([place])   Acute hypoxic respiratory failure with compromised airway secondary to acute encephalopathy  Patient extubated on 6/50  Acute metabolic toxic encephalopathy: CT Head and MRI brain negative for acute intracranial abnormalities; Suspect to be secondary to sepsis vs uremia. He had HD sessions per MWF schedule with improvement in mental status. -d/c Seroquel  MRSA bacteremia  Epidural abscess s/p laminectomy: - Continued on vancomycin and rifampin x 8 weeks total per ID  -Leukocytosis: much improved  on 8/14  ESRD on HD: - HD per nephrology  Anemia of chronic disease: No signs of active bleeding.  Suspect secondary to acute illness and anemia of chronic disease.  - ESA per nephrology   Type 2 DM with hyperglycemia - Levemir + SSI     Family Communication/Anticipated D/C date and plan/Code Status   DVT prophylaxis: Lovenox ordered. Code Status: Full Code.  Disposition Plan: Status is: Inpatient  Remains inpatient appropriate because:IV treatments appropriate due to intensity of illness or inability to take PO   Dispo: The patient is from: Home              Anticipated d/c is to: Home vs SNF              Anticipated d/c date is: 2 days              Patient currently is not medically stable to d/c.         Medical Consultants:    PCCM  ID  renal   Subjective:   Not sure what day it is  Objective:    Vitals:   01/15/20 0412 01/15/20 0412 01/15/20 0813 01/15/20 1100  BP: (!) 138/53  (!) 145/61 (!) 145/61  Pulse: 80  84 84  Resp:   17   Temp: 99.4 F (37.4 C)  98.6 F (37 C)   TempSrc: Oral  Oral   SpO2: 100% 100% 98%   Weight:      Height:  Intake/Output Summary (Last 24 hours) at 01/15/2020 1312 Last data filed at 01/15/2020 1121 Gross per 24 hour  Intake 250 ml  Output --  Net 250 ml   Filed Weights   01/12/20 1105 01/13/20 0500 01/14/20 0338  Weight: 81.7 kg 91.2 kg 96.4 kg    Exam:  General: Appearance:     Overweight male in no acute distress     Lungs:     Clear to auscultation bilaterally, respirations unlabored  Heart:    Normal heart rate. Normal rhythm.    MS:   All extremities are intact.   Neurologic: Awake and alert, confused about day and situation    Data Reviewed:   I have personally reviewed following labs and imaging studies:  Labs: Labs show the following:   Basic Metabolic Panel: Recent Labs  Lab 01/09/20 0307 01/09/20 0307 01/10/20 0230 01/11/20 0002 01/11/20 0202 01/11/20 0205  01/12/20 0403 01/12/20 0412 01/12/20 1209 01/12/20 1209 01/12/20 2029 01/12/20 2029 01/13/20 0413 01/13/20 0413 01/14/20 0330 01/15/20 0410  NA 135   < > 131*   < > 138   < > 137   < > 137  --  134*  --  134*  --  131* 134*  K 5.6*   < > 4.5   < > 4.3   < > 4.1   < > 3.1*   < > 3.8   < > 3.9   < > 3.7 4.2  CL 94*   < > 91*  --  96*   < > 91*  --   --   --  94*  --  94*  --  91* 97*  CO2 20*   < > 21*  --  24   < > 29  --   --   --  29  --  26  --  25 25  GLUCOSE 179*   < > 222*  --  175*   < > 314*  --   --   --  167*  --  245*  --  112* 77  BUN 102*   < > 119*  --  50*   < > 92*  --   --   --  48*  --  60*  --  83* 38*  CREATININE 10.85*   < > 12.16*  --  6.11*   < > 9.15*  --   --   --  5.25*  --  6.16*  --  8.35* 5.78*  CALCIUM 8.0*   < > 7.8*  --  7.8*   < > 7.5*  --   --   --  7.5*  --  7.5*  --  7.7* 7.7*  MG 2.3  --  2.3  --   --   --   --   --   --   --   --   --   --   --   --   --   PHOS 8.6*   < > 9.6*  --  6.9*  --  9.7*  --   --   --   --   --  6.0*  --  6.8* 5.2*   < > = values in this interval not displayed.   GFR Estimated Creatinine Clearance: 14.6 mL/min (A) (by C-G formula based on SCr of 5.78 mg/dL (H)). Liver Function Tests: Recent Labs  Lab 01/10/20 1355 01/10/20 1355 01/11/20 0202 01/12/20 0403 01/13/20 0413 01/14/20 0330 01/15/20  0410  AST 27  --   --   --  30  --   --   ALT 24  --   --   --  20  --   --   ALKPHOS 98  --   --   --  77  --   --   BILITOT 2.5*  --   --   --  2.3*  --   --   PROT 7.6  --   --   --  6.5  --   --   ALBUMIN 1.9*   < > 2.4* 2.0* 1.8* 1.9* 1.7*   < > = values in this interval not displayed.   No results for input(s): LIPASE, AMYLASE in the last 168 hours. Recent Labs  Lab 01/10/20 1355  AMMONIA 24   Coagulation profile No results for input(s): INR, PROTIME in the last 168 hours.  CBC: Recent Labs  Lab 01/11/20 0818 01/11/20 0852 01/12/20 0403 01/12/20 0403 01/12/20 0412 01/12/20 1209 01/13/20 0413  01/14/20 0330 01/15/20 0410  WBC 20.2*  --  20.5*  --   --   --  22.3* 27.0* 13.0*  HGB 8.7*   < > 8.0*   < > 8.8* 9.2* 7.5* 7.5* 7.2*  HCT 25.6*   < > 24.0*   < > 26.0* 27.0* 22.7* 22.6* 21.8*  MCV 76.2*  --  78.9*  --   --   --  81.4 79.9* 82.0  PLT 290  --  343  --   --   --  271 334 228   < > = values in this interval not displayed.   Cardiac Enzymes: No results for input(s): CKTOTAL, CKMB, CKMBINDEX, TROPONINI in the last 168 hours. BNP (last 3 results) No results for input(s): PROBNP in the last 8760 hours. CBG: Recent Labs  Lab 01/14/20 2025 01/15/20 0014 01/15/20 0413 01/15/20 0811 01/15/20 1219  GLUCAP 102* 104* 73 71 144*   D-Dimer: No results for input(s): DDIMER in the last 72 hours. Hgb A1c: No results for input(s): HGBA1C in the last 72 hours. Lipid Profile: Recent Labs    01/13/20 0413  TRIG 116   Thyroid function studies: No results for input(s): TSH, T4TOTAL, T3FREE, THYROIDAB in the last 72 hours.  Invalid input(s): FREET3 Anemia work up: No results for input(s): VITAMINB12, FOLATE, FERRITIN, TIBC, IRON, RETICCTPCT in the last 72 hours. Sepsis Labs: Recent Labs  Lab 01/12/20 0403 01/13/20 0413 01/14/20 0330 01/15/20 0410  WBC 20.5* 22.3* 27.0* 13.0*    Microbiology Recent Results (from the past 240 hour(s))  Aerobic/Anaerobic Culture (surgical/deep wound)     Status: None   Collection Time: 01/06/20  2:52 PM   Specimen: Soft Tissue, Other  Result Value Ref Range Status   Specimen Description WOUND  Final   Special Requests POSTOP CERVICAL LAMINECTOMY AND FUSION SITE  Final   Gram Stain NO WBC SEEN ABUNDANT GRAM POSITIVE COCCI   Final   Culture   Final    MODERATE METHICILLIN RESISTANT STAPHYLOCOCCUS AUREUS NO ANAEROBES ISOLATED Performed at [facility], [place] [address], [place], [place] [address]    Report Status 01/11/2020 FINAL  Final   Organism ID, Bacteria METHICILLIN RESISTANT STAPHYLOCOCCUS AUREUS  Final       Susceptibility   Methicillin resistant staphylococcus aureus - MIC*    CIPROFLOXACIN >=8 RESISTANT Resistant     ERYTHROMYCIN >=8 RESISTANT Resistant     GENTAMICIN <=0.5 SENSITIVE Sensitive  OXACILLIN >=4 RESISTANT Resistant     TETRACYCLINE <=1 SENSITIVE Sensitive     VANCOMYCIN 1 SENSITIVE Sensitive     TRIMETH/SULFA <=10 SENSITIVE Sensitive     CLINDAMYCIN <=0.25 SENSITIVE Sensitive     RIFAMPIN <=0.5 SENSITIVE Sensitive     Inducible Clindamycin NEGATIVE Sensitive     * MODERATE METHICILLIN RESISTANT STAPHYLOCOCCUS AUREUS  Culture, blood (routine x 2)     Status: None   Collection Time: 01/07/20  7:10 AM   Specimen: BLOOD  Result Value Ref Range Status   Specimen Description BLOOD RIGHT ANTECUBITAL  Final   Special Requests   Final    BOTTLES DRAWN AEROBIC ONLY Blood Culture results may not be optimal due to an inadequate volume of blood received in culture bottles   Culture   Final    NO GROWTH 5 DAYS Performed at [facility], [place] [address] [place], [place] [address]    Report Status 01/12/2020 FINAL  Final  Culture, blood (routine x 2)     Status: None   Collection Time: 01/07/20  7:21 AM   Specimen: BLOOD RIGHT HAND  Result Value Ref Range Status   Specimen Description BLOOD RIGHT HAND  Final   Special Requests   Final    BOTTLES DRAWN AEROBIC AND ANAEROBIC Blood Culture results may not be optimal due to an excessive volume of blood received in culture bottles   Culture   Final    NO GROWTH 5 DAYS Performed at [facility], [place] [address], [place], [place] [address]    Report Status 01/12/2020 FINAL  Final  Culture, blood (Routine X 2) w Reflex to ID Panel     Status: None   Collection Time: 01/07/20  7:15 PM   Specimen: BLOOD  Result Value Ref Range Status   Specimen Description BLOOD BLOOD RIGHT FOREARM  Final   Special Requests   Final    BOTTLES DRAWN AEROBIC AND ANAEROBIC Blood Culture adequate volume   Culture   Final    NO GROWTH 5  DAYS Performed at [facility], [place] [address], [place], [place] [address]    Report Status 01/12/2020 FINAL  Final  Culture, blood (Routine X 2) w Reflex to ID Panel     Status: None   Collection Time: 01/07/20  7:26 PM   Specimen: BLOOD RIGHT HAND  Result Value Ref Range Status   Specimen Description BLOOD RIGHT HAND  Final   Special Requests   Final    BOTTLES DRAWN AEROBIC AND ANAEROBIC Blood Culture adequate volume   Culture   Final    NO GROWTH 5 DAYS Performed at [facility], [place] [address], [place], [place] [address]    Report Status 01/12/2020 FINAL  Final  MRSA PCR Screening     Status: None   Collection Time: 01/10/20  2:20 PM   Specimen: Nasopharyngeal  Result Value Ref Range Status   MRSA by PCR NEGATIVE NEGATIVE Final    Comment:        The GeneXpert MRSA Assay (FDA approved for NASAL specimens only), is one component of a comprehensive MRSA colonization surveillance program. It is not intended to diagnose MRSA infection nor to guide or monitor treatment for MRSA infections. Performed at [facility], [place] [address] [place],  [address]     Procedures and diagnostic studies:  No results found.  Medications:   . (feeding supplement) PROSource Plus  30 mL Oral BID  BM  . atorvastatin  20 mg Oral QHS  . chlorhexidine gluconate (MEDLINE KIT)  15 mL Mouth Rinse BID  . Chlorhexidine Gluconate Cloth  6 each Topical Q0600  . [START ON 01/17/2020] darbepoetin (ARANESP) injection - DIALYSIS  100 mcg Intravenous Q Mon-HD  . enoxaparin (LOVENOX) injection  30 mg Subcutaneous Q24H  . feeding supplement (NEPRO CARB STEADY)  237 mL Oral BID BM  . HYDROcodone-acetaminophen  1 tablet Oral Q6H  . insulin aspart  0-9 Units Subcutaneous Q4H  . insulin detemir  11 Units Subcutaneous BID  . metoprolol tartrate  25 mg Oral BID  . multivitamin  1 tablet Oral QHS  . rifampin  300 mg Oral Q12H  . sodium chloride flush  10-40 mL Intracatheter Q12H    Continuous Infusions: . sodium chloride 10 mL/hr at 01/13/20 1700  . vancomycin Stopped (01/14/20 1151)     LOS: 11 days   Geradine Girt  [facility]   How to contact the [facility] Attending or Consulting provider [place] or covering provider during after hours [place], for this patient?  1. Check the care team in [facility] and look for a) attending/consulting [facility] provider listed and b) the [facility] team listed 2. Log into [email or web address] and use [place]'s universal password to access. If you do not have the password, please contact the hospital operator. 3. Locate the [facility] provider you are looking for under [facility] and page to a number that you can be directly reached. 4. If you still have difficulty reaching the provider, please page the [facility] (Director on Call) for the Hospitalists listed on amion for assistance.  01/15/2020, 1:12 PM

## 2020-01-15 NOTE — Plan of Care (Signed)

## 2020-01-16 LAB — CBC
HCT: 21.1 % — ABNORMAL LOW (ref 39.0–52.0)
Hemoglobin: 6.9 g/dL — CL (ref 13.0–17.0)
MCH: 27 pg (ref 26.0–34.0)
MCHC: 32.7 g/dL (ref 30.0–36.0)
MCV: 82.4 fL (ref 80.0–100.0)
Platelets: 224 10*3/uL (ref 150–400)
RBC: 2.56 MIL/uL — ABNORMAL LOW (ref 4.22–5.81)
RDW: 17.7 % — ABNORMAL HIGH (ref 11.5–15.5)
WBC: 11.1 10*3/uL — ABNORMAL HIGH (ref 4.0–10.5)
nRBC: 0 % (ref 0.0–0.2)

## 2020-01-16 LAB — GLUCOSE, CAPILLARY
Glucose-Capillary: 134 mg/dL — ABNORMAL HIGH (ref 70–99)
Glucose-Capillary: 139 mg/dL — ABNORMAL HIGH (ref 70–99)
Glucose-Capillary: 146 mg/dL — ABNORMAL HIGH (ref 70–99)
Glucose-Capillary: 170 mg/dL — ABNORMAL HIGH (ref 70–99)
Glucose-Capillary: 80 mg/dL (ref 70–99)
Glucose-Capillary: 90 mg/dL (ref 70–99)

## 2020-01-16 LAB — RENAL FUNCTION PANEL
Albumin: 1.7 g/dL — ABNORMAL LOW (ref 3.5–5.0)
Anion gap: 11 (ref 5–15)
BUN: 55 mg/dL — ABNORMAL HIGH (ref 8–23)
CO2: 24 mmol/L (ref 22–32)
Calcium: 7.5 mg/dL — ABNORMAL LOW (ref 8.9–10.3)
Chloride: 97 mmol/L — ABNORMAL LOW (ref 98–111)
Creatinine, Ser: 8.4 mg/dL — ABNORMAL HIGH (ref 0.61–1.24)
GFR calc Af Amer: 7 mL/min — ABNORMAL LOW (ref >60)
GFR calc non Af Amer: 6 mL/min — ABNORMAL LOW (ref >60)
Glucose, Bld: 151 mg/dL — ABNORMAL HIGH (ref 70–99)
Phosphorus: 6.2 mg/dL — ABNORMAL HIGH (ref 2.5–4.6)
Potassium: 4 mmol/L (ref 3.5–5.1)
Sodium: 132 mmol/L — ABNORMAL LOW (ref 135–145)

## 2020-01-16 LAB — PREPARE RBC (CROSSMATCH)

## 2020-01-16 MED ORDER — SODIUM CHLORIDE 0.9% IV SOLUTION: Freq: Once | INTRAVENOUS | Status: AC

## 2020-01-16 MED ORDER — SODIUM CHLORIDE 0.9 % IV SOLN: 100.0000 mL | INTRAVENOUS | Status: DC | PRN

## 2020-01-16 MED ORDER — LIDOCAINE HCL (PF) 1 % IJ SOLN: 5.0000 mL | INTRAMUSCULAR | Status: DC | PRN

## 2020-01-16 MED ORDER — HEPARIN SODIUM (PORCINE) 1000 UNIT/ML DIALYSIS: 1000.0000 [IU] | INTRAMUSCULAR | Status: DC | PRN

## 2020-01-16 MED ORDER — PENTAFLUOROPROP-TETRAFLUOROETH EX AERO: 1.0000 "application " | INHALATION_SPRAY | CUTANEOUS | Status: DC | PRN

## 2020-01-16 MED ORDER — LIDOCAINE-PRILOCAINE 2.5-2.5 % EX CREA: 1.0000 "application " | TOPICAL_CREAM | CUTANEOUS | Status: DC | PRN

## 2020-01-16 MED ORDER — CHLORHEXIDINE GLUCONATE CLOTH 2 % EX PADS
6.0000 | MEDICATED_PAD | Freq: Every day | CUTANEOUS | Status: DC
  Administered 2020-01-16 – 2020-01-17 (×2): 6 via TOPICAL

## 2020-01-16 MED ORDER — ALTEPLASE 2 MG IJ SOLR: 2.0000 mg | Freq: Once | INTRAMUSCULAR | Status: DC | PRN

## 2020-01-16 MED ORDER — SEVELAMER CARBONATE 800 MG PO TABS
1600.0000 mg | ORAL_TABLET | Freq: Three times a day (TID) | ORAL | Status: DC
  Administered 2020-01-16 – 2020-01-23 (×9): 1600 mg via ORAL
  Filled 2020-01-16 (×11): qty 2

## 2020-01-16 NOTE — Progress Notes (Signed)
Patient has refused all scheduled medications tonight. Intermittent confused noted: wants to me to call the ambulance or his daughters to take him to the hospital. In the same conversation wants me to call his sister Priscilla's husband to come and pick him up.  Call out's made to: Raybon Conard: [phone]/HHTX box full unable to leave a message. Raymon Mutton, no answer. And Sister Lannette Donath: [phone], no answer.   Covering MD on call made aware.  Bed exit alarm maintained , call light and phones in reach.

## 2020-01-16 NOTE — Plan of Care (Signed)
  Problem: Education: Goal: Knowledge of General Education information will improve Description: Including pain rating scale, medication(s)/side effects and non-pharmacologic comfort measures Outcome: Progressing   Problem: Clinical Measurements: Goal: Ability to maintain clinical measurements within normal limits will improve Outcome: Progressing   Problem: Activity: Goal: Risk for activity intolerance will decrease Outcome: Progressing   Problem: Coping: Goal: Level of anxiety will decrease Outcome: Progressing   Problem: Pain Managment: Goal: General experience of comfort will improve Outcome: Progressing   

## 2020-01-16 NOTE — Progress Notes (Addendum)
[facility] Progress Note   Subjective:   Patient seen in room. Reports appetite is better today. Denies SOB, CP, palpitations, dizziness, abdominal pain, N/V/D. Note Hgb 6.9- PRBC and FOBT already ordered. Pt denies overt blood loss. He is unsure what day it is and is anxious. Assisted patient in calling his daughter this morning.   Objective Vitals:   01/15/20 1518 01/15/20 1900 01/16/20 0432 01/16/20 0901  BP: (!) 147/64 140/61 (!) 153/61 (!) 158/66  Pulse: 78 80 76 81  Resp: '17 16  16  ' Temp: 98.9 F (37.2 C) 99.4 F (37.4 C) 98.5 F (36.9 C) 98.4 F (36.9 C)  TempSrc: Oral Oral Oral Oral  SpO2: 100% 98% 100% 100%  Weight:      Height:       Physical Exam General: Well developed male, alert and in NAD.  Heart: RRR, no murmurs, rubs or gallops Lungs: CTA bilaterally without wheezing, rhonchi or rales Abdomen: Soft, non-tender, non-distended. +BS Extremities: No edema b/l lower extremities Dialysis Access: L AVF w/ improving infiltration, +bruit, R IJ temp HD cath in place  Additional Objective Labs: Basic Metabolic Panel: Recent Labs  Lab 01/14/20 0330 01/15/20 0410 01/16/20 0338  NA 131* 134* 132*  K 3.7 4.2 4.0  CL 91* 97* 97*  CO2 '25 25 24  ' GLUCOSE 112* 77 151*  BUN 83* 38* 55*  CREATININE 8.35* 5.78* 8.40*  CALCIUM 7.7* 7.7* 7.5*  PHOS 6.8* 5.2* 6.2*   Liver Function Tests: Recent Labs  Lab 01/10/20 1355 01/11/20 0202 01/13/20 0413 01/13/20 0413 01/14/20 0330 01/15/20 0410 01/16/20 0338  AST 27  --  30  --   --   --   --   ALT 24  --  20  --   --   --   --   ALKPHOS 98  --  77  --   --   --   --   BILITOT 2.5*  --  2.3*  --   --   --   --   PROT 7.6  --  6.5  --   --   --   --   ALBUMIN 1.9*   < > 1.8*   < > 1.9* 1.7* 1.7*   < > = values in this interval not displayed.   No results for input(s): LIPASE, AMYLASE in the last 168 hours. CBC: Recent Labs  Lab 01/12/20 0403 01/12/20 0412 01/13/20 0413 01/13/20 0413  01/14/20 0330 01/15/20 0410 01/16/20 0338  WBC 20.5*   < > 22.3*   < > 27.0* 13.0* 11.1*  HGB 8.0*   < > 7.5*   < > 7.5* 7.2* 6.9*  HCT 24.0*   < > 22.7*   < > 22.6* 21.8* 21.1*  MCV 78.9*  --  81.4  --  79.9* 82.0 82.4  PLT 343   < > 271   < > 334 228 224   < > = values in this interval not displayed.   Blood Culture    Component Value Date/Time   SDES BLOOD RIGHT HAND 01/07/2020 1926   SPECREQUEST  01/07/2020 1926    BOTTLES DRAWN AEROBIC AND ANAEROBIC Blood Culture adequate volume   CULT  01/07/2020 1926    NO GROWTH 5 DAYS Performed at [facility], [place] [address] [place], [place] [address]    REPTSTATUS 01/12/2020 FINAL 01/07/2020 1926    CBG: Recent Labs  Lab 01/15/20 1219 01/15/20 1630 01/15/20 2005 01/16/20 0002 01/16/20 0435  GLUCAP 144* 154* 126* 139* 134*   Medications: . sodium chloride 10 mL/hr at 01/13/20 1700  . vancomycin Stopped (01/14/20 1151)   . (feeding supplement) PROSource Plus  30 mL Oral BID BM  . atorvastatin  20 mg Oral QHS  . chlorhexidine gluconate (MEDLINE KIT)  15 mL Mouth Rinse BID  . Chlorhexidine Gluconate Cloth  6 each Topical Q0600  . [START ON 01/17/2020] darbepoetin (ARANESP) injection - DIALYSIS  100 mcg Intravenous Q Mon-HD  . enoxaparin (LOVENOX) injection  30 mg Subcutaneous Q24H  . feeding supplement (NEPRO CARB STEADY)  237 mL Oral BID BM  . HYDROcodone-acetaminophen  1 tablet Oral Q6H  . insulin aspart  0-9 Units Subcutaneous Q4H  . insulin detemir  11 Units Subcutaneous BID  . metoprolol tartrate  25 mg Oral BID  . multivitamin  1 tablet Oral QHS  . rifampin  300 mg Oral Q12H  . sodium chloride flush  10-40 mL Intracatheter Q12H    Dialysis Orders: [facility] MWF 4h 6mn 99kg 2/2 bath RIJ TDC (removed)/ LUA AVF Hep none - Calcitriol 1.247m PO q HD - Mircera 150 mcg IV q 2 weeks (last given on 12/29/19)  Assessment/Plan: 1. Epidural abscess:MRI showed abscess extending from C4-T2 with cord compression,  seen by NSurg and went to OR for C4-T2 for decomp/ laminectomy/ fusion on 01/06/20 with improved pain.  2. MRSA bacteremia:Repeat BCx 8/6 negative. TTE 8/6 without vegetation. TDC removed 8/6, using LUE AVF until infiltrated 8/9, now using temp HD cath. IV vanc and rifampin per ID.  3. Resp failure - intubated8/9 -8/12. Off vent and stable 4. Confusion: azotemia resolved w/ dialysis, seems improved but still demonstrates short term memory impairment. Not sure the cause.  5. ESRD: HD on MWF schedule. Completed HD 8/13 and received 500cc IVF. Next HD 8/16 6. Infiltrated AVF: unable to access last HD w/ 3 attempts to stick. Using temp cath for now. Swelling is better, will try to use AVF tomorrow. May need eval/ fistulogram next week. 7. HTN/volume: BP's wnl,2-3 kg under dry, no volume excess on exam.  8. Anemia: Hgb 6.9, PRBC and FOBT already ordered. Due for ESA, will order aranesp with next dialysis- increased dose. 9. Secondary hyperparathyroidism:Phos high - started on Renvela 160051mID on 8/8, phos now 6.2, follow.   SamAnice PaganiniA-C 01/16/2020, 10:05 AM  [facility] Pager: [phone] seen, examined and agree w A/P as above.  RobKelly SplinterD 01/16/2020, 11:50 AM

## 2020-01-16 NOTE — Progress Notes (Signed)
  Progress Note    Rodney Dawe Jr.  MRN:[id number] DOB: 12/05/1950  DOA: 01/04/2020 PCP: [facility]    Brief Narrative:    Medical records reviewed and are as summarized below:  Rodney Alvillar Jr. is an 69 y.o. male with history of ESRD on hemodialysis on Monday Wednesday Friday, hypertension, diabetes mellitus type 2, anemia history of colon cancer status post resection has been having increasing headache back pain over the last week or so.  Patient had come to the ER about 7 days ago with headache at that time CT head was unremarkable and was discharged on carbamazepine for possible trigeminal neuralgia.  Since then patient has become more weak and confused but has not taken his medication which was prescribed.  Patient also was unable to get out of the tub on Saturday that is about 3 days ago.  Since patient has been worsening pain patient was brought to the ER again.  Pain is mostly in the head and upper back and neck.  Per patient's daughter patient also was finding it difficult to ambulate because of the weakness.  Assessment/Plan:   Principal Problem:   MRSA bacteremia Active Problems:   TOBACCO ABUSE   OBSTRUCTIVE SLEEP APNEA   Colon cancer, status Post resection    HYPERTENSION   ESRD (end stage renal disease) (HCC)   Epidural abscess   Acute encephalopathy   Sepsis (HCC)   Bacteremia associated with intravascular line (HCC)   Hemodialysis patient (HCC)   Acute hypoxic respiratory failure with compromised airway secondary to acute encephalopathy  Patient extubated on 8/12  Acute metabolic toxic encephalopathy: CT Head and MRI brain negative for acute intracranial abnormalities; Suspect to be secondary to sepsis vs uremia. He had HD sessions per MWF schedule with improvement in mental status. -d/c Seroquel as not on prior  MRSA bacteremia  Epidural abscess s/p laminectomy: - Continued on vancomycin and rifampin x 8 weeks total per ID  -Leukocytosis:  continues to improve  ESRD on HD: - HD per nephrology -has temp in HD cath in right neck-- defer access issues to renal  Anemia of chronic disease: No signs of active bleeding.  Suspect secondary to acute illness and anemia of chronic disease.  -aranesp per nephrology  -stool for occult blood -has been ordered 1 unit PRBC overnight  Type 2 DM with hyperglycemia - Levemir + SSI     Family Communication/Anticipated D/C date and plan/Code Status   DVT prophylaxis: Lovenox ordered. Code Status: Full Code.  Disposition Plan: Status is: Inpatient  Remains inpatient appropriate because:IV treatments appropriate due to intensity of illness or inability to take PO   Dispo: The patient is from: Home              Anticipated d/c is to: Home vs SNF              Anticipated d/c date is: 2 days              Patient currently is not medically stable to d/c.         Medical Consultants:    PCCM  ID  renal   Subjective:  No overnight events, not sleeping well  Objective:    Vitals:   01/15/20 1100 01/15/20 1518 01/15/20 1900 01/16/20 0432  BP: (!) 145/61 (!) 147/64 140/61 (!) 153/61  Pulse: 84 78 80 76  Resp:  17 16   Temp:  98.9 F (37.2 C) 99.4 F (37.4 C) 98.5 F (36.9   C)  TempSrc:  Oral Oral Oral  SpO2:  100% 98% 100%  Weight:      Height:        Intake/Output Summary (Last 24 hours) at 01/16/2020 0857 Last data filed at 01/15/2020 2100 Gross per 24 hour  Intake 730 ml  Output --  Net 730 ml   Filed Weights   01/12/20 1105 01/13/20 0500 01/14/20 0338  Weight: 81.7 kg 91.2 kg 96.4 kg    Exam:  General: Appearance:     Overweight male in no acute distress     Lungs:      respirations unlabored  Heart:    Normal heart rate. Normal rhythm.    MS:   All extremities are intact.   Neurologic:   Awake, alert, oriented x 3. No apparent focal neurological           defect.      Data Reviewed:   I have personally reviewed following labs and  imaging studies:  Labs: Labs show the following:   Basic Metabolic Panel: Recent Labs  Lab 01/10/20 0230 01/11/20 0002 01/12/20 0403 01/12/20 0412 01/12/20 2029 01/12/20 2029 01/13/20 0413 01/13/20 0413 01/14/20 0330 01/14/20 0330 01/15/20 0410 01/16/20 0338  NA 131*   < > 137   < > 134*  --  134*  --  131*  --  134* 132*  K 4.5   < > 4.1   < > 3.8   < > 3.9   < > 3.7   < > 4.2 4.0  CL 91*   < > 91*   < > 94*  --  94*  --  91*  --  97* 97*  CO2 21*   < > 29   < > 29  --  26  --  25  --  25 24  GLUCOSE 222*   < > 314*   < > 167*  --  245*  --  112*  --  77 151*  BUN 119*   < > 92*   < > 48*  --  60*  --  83*  --  38* 55*  CREATININE 12.16*   < > 9.15*   < > 5.25*  --  6.16*  --  8.35*  --  5.78* 8.40*  CALCIUM 7.8*   < > 7.5*   < > 7.5*  --  7.5*  --  7.7*  --  7.7* 7.5*  MG 2.3  --   --   --   --   --   --   --   --   --   --   --   PHOS 9.6*   < > 9.7*  --   --   --  6.0*  --  6.8*  --  5.2* 6.2*   < > = values in this interval not displayed.   GFR Estimated Creatinine Clearance: 10.1 mL/min (A) (by C-G formula based on SCr of 8.4 mg/dL (H)). Liver Function Tests: Recent Labs  Lab 01/10/20 1355 01/11/20 0202 01/12/20 0403 01/13/20 0413 01/14/20 0330 01/15/20 0410 01/16/20 0338  AST 27  --   --  30  --   --   --   ALT 24  --   --  20  --   --   --   ALKPHOS 98  --   --  77  --   --   --   BILITOT 2.5*  --   --    2.3*  --   --   --   PROT 7.6  --   --  6.5  --   --   --   ALBUMIN 1.9*   < > 2.0* 1.8* 1.9* 1.7* 1.7*   < > = values in this interval not displayed.   No results for input(s): LIPASE, AMYLASE in the last 168 hours. Recent Labs  Lab 01/10/20 1355  AMMONIA 24   Coagulation profile No results for input(s): INR, PROTIME in the last 168 hours.  CBC: Recent Labs  Lab 01/12/20 0403 01/12/20 0412 01/12/20 1209 01/13/20 0413 01/14/20 0330 01/15/20 0410 01/16/20 0338  WBC 20.5*  --   --  22.3* 27.0* 13.0* 11.1*  HGB 8.0*   < > 9.2* 7.5* 7.5* 7.2*  6.9*  HCT 24.0*   < > 27.0* 22.7* 22.6* 21.8* 21.1*  MCV 78.9*  --   --  81.4 79.9* 82.0 82.4  PLT 343  --   --  271 334 228 224   < > = values in this interval not displayed.   Cardiac Enzymes: No results for input(s): CKTOTAL, CKMB, CKMBINDEX, TROPONINI in the last 168 hours. BNP (last 3 results) No results for input(s): PROBNP in the last 8760 hours. CBG: Recent Labs  Lab 01/15/20 1219 01/15/20 1630 01/15/20 2005 01/16/20 0002 01/16/20 0435  GLUCAP 144* 154* 126* 139* 134*   D-Dimer: No results for input(s): DDIMER in the last 72 hours. Hgb A1c: No results for input(s): HGBA1C in the last 72 hours. Lipid Profile: No results for input(s): CHOL, HDL, LDLCALC, TRIG, CHOLHDL, LDLDIRECT in the last 72 hours. Thyroid function studies: No results for input(s): TSH, T4TOTAL, T3FREE, THYROIDAB in the last 72 hours.  Invalid input(s): FREET3 Anemia work up: No results for input(s): VITAMINB12, FOLATE, FERRITIN, TIBC, IRON, RETICCTPCT in the last 72 hours. Sepsis Labs: Recent Labs  Lab 01/13/20 0413 01/14/20 0330 01/15/20 0410 01/16/20 0338  WBC 22.3* 27.0* 13.0* 11.1*    Microbiology Recent Results (from the past 240 hour(s))  Aerobic/Anaerobic Culture (surgical/deep wound)     Status: None   Collection Time: 01/06/20  2:52 PM   Specimen: Soft Tissue, Other  Result Value Ref Range Status   Specimen Description WOUND  Final   Special Requests POSTOP CERVICAL LAMINECTOMY AND FUSION SITE  Final   Gram Stain NO WBC SEEN ABUNDANT GRAM POSITIVE COCCI   Final   Culture   Final    MODERATE METHICILLIN RESISTANT STAPHYLOCOCCUS AUREUS NO ANAEROBES ISOLATED Performed at [facility], [address] , [place] [address]    Report Status 01/11/2020 FINAL  Final   Organism ID, Bacteria METHICILLIN RESISTANT STAPHYLOCOCCUS AUREUS  Final      Susceptibility   Methicillin resistant staphylococcus aureus - MIC*    CIPROFLOXACIN >=8 RESISTANT Resistant      ERYTHROMYCIN >=8 RESISTANT Resistant     GENTAMICIN <=0.5 SENSITIVE Sensitive     OXACILLIN >=4 RESISTANT Resistant     TETRACYCLINE <=1 SENSITIVE Sensitive     VANCOMYCIN 1 SENSITIVE Sensitive     TRIMETH/SULFA <=10 SENSITIVE Sensitive     CLINDAMYCIN <=0.25 SENSITIVE Sensitive     RIFAMPIN <=0.5 SENSITIVE Sensitive     Inducible Clindamycin NEGATIVE Sensitive     * MODERATE METHICILLIN RESISTANT STAPHYLOCOCCUS AUREUS  Culture, blood (routine x 2)     Status: None   Collection Time: 01/07/20  7:10 AM   Specimen: BLOOD  Result Value Ref Range Status   Specimen Description   BLOOD RIGHT ANTECUBITAL  Final   Special Requests   Final    BOTTLES DRAWN AEROBIC ONLY Blood Culture results may not be optimal due to an inadequate volume of blood received in culture bottles   Culture   Final    NO GROWTH 5 DAYS Performed at [facility], [place] [address] [place], [place] [address]    Report Status 01/12/2020 FINAL  Final  Culture, blood (routine x 2)     Status: None   Collection Time: 01/07/20  7:21 AM   Specimen: BLOOD RIGHT HAND  Result Value Ref Range Status   Specimen Description BLOOD RIGHT HAND  Final   Special Requests   Final    BOTTLES DRAWN AEROBIC AND ANAEROBIC Blood Culture results may not be optimal due to an excessive volume of blood received in culture bottles   Culture   Final    NO GROWTH 5 DAYS Performed at [facility], [place] [address] [place], [place] [address]    Report Status 01/12/2020 FINAL  Final  Culture, blood (Routine X 2) w Reflex to ID Panel     Status: None   Collection Time: 01/07/20  7:15 PM   Specimen: BLOOD  Result Value Ref Range Status   Specimen Description BLOOD BLOOD RIGHT FOREARM  Final   Special Requests   Final    BOTTLES DRAWN AEROBIC AND ANAEROBIC Blood Culture adequate volume   Culture   Final    NO GROWTH 5 DAYS Performed at [facility], [place] [address] [place], [place] [address]    Report Status 01/12/2020 FINAL   Final  Culture, blood (Routine X 2) w Reflex to ID Panel     Status: None   Collection Time: 01/07/20  7:26 PM   Specimen: BLOOD RIGHT HAND  Result Value Ref Range Status   Specimen Description BLOOD RIGHT HAND  Final   Special Requests   Final    BOTTLES DRAWN AEROBIC AND ANAEROBIC Blood Culture adequate volume   Culture   Final    NO GROWTH 5 DAYS Performed at [facility], [place] [address], [place], [place] [address]    Report Status 01/12/2020 FINAL  Final  MRSA PCR Screening     Status: None   Collection Time: 01/10/20  2:20 PM   Specimen: Nasopharyngeal  Result Value Ref Range Status   MRSA by PCR NEGATIVE NEGATIVE Final    Comment:        The GeneXpert MRSA Assay (FDA approved for NASAL specimens only), is one component of a comprehensive MRSA colonization surveillance program. It is not intended to diagnose MRSA infection nor to guide or monitor treatment for MRSA infections. Performed at [facility], [place] [address], [place], [place] [address]     Procedures and diagnostic studies:  No results found.  Medications:   . (feeding supplement) PROSource Plus  30 mL Oral BID BM  . sodium chloride   Intravenous Once  . atorvastatin  20 mg Oral QHS  . chlorhexidine gluconate (MEDLINE KIT)  15 mL Mouth Rinse BID  . Chlorhexidine Gluconate Cloth  6 each Topical Q0600  . [START ON 01/17/2020] darbepoetin (ARANESP) injection - DIALYSIS  100 mcg Intravenous Q Mon-HD  . enoxaparin (LOVENOX) injection  30 mg Subcutaneous Q24H  . feeding supplement (NEPRO CARB STEADY)  237 mL Oral BID BM  . HYDROcodone-acetaminophen  1 tablet Oral Q6H  . insulin aspart  0-9 Units Subcutaneous Q4H  .  insulin detemir  11 Units Subcutaneous BID  . metoprolol tartrate  25 mg Oral BID  . multivitamin  1 tablet Oral QHS  . rifampin  300 mg Oral Q12H  . sodium chloride flush  10-40 mL Intracatheter Q12H   Continuous Infusions: . sodium chloride 10 mL/hr at 01/13/20 1700  .  vancomycin Stopped (01/14/20 1151)     LOS: 12 days    U   [facility]   How to contact the [facility] Attending or Consulting provider 7A - 7P or covering provider during after hours 7P -7A, for this patient?  1. Check the care team in [facility] and look for a) attending/consulting TR[facility] provider listed and b) the [facility] team listed 2. Log into [email or web address] and use [place]'s universal password to access. If you do not have the password, please contact the hospital operator. 3. Locate the [facility] provider you are looking for under [facility] and page to a number that you can be directly reached. 4. If you still have difficulty reaching the provider, please page the [facility] (Director on Call) for the Hospitalists listed on amion for assistance.  01/16/2020, 8:57 AM         

## 2020-01-17 LAB — CBC
HCT: 23.5 % — ABNORMAL LOW (ref 39.0–52.0)
Hemoglobin: 7.7 g/dL — ABNORMAL LOW (ref 13.0–17.0)
MCH: 26.8 pg (ref 26.0–34.0)
MCHC: 32.8 g/dL (ref 30.0–36.0)
MCV: 81.9 fL (ref 80.0–100.0)
Platelets: 220 10*3/uL (ref 150–400)
RBC: 2.87 MIL/uL — ABNORMAL LOW (ref 4.22–5.81)
RDW: 17.3 % — ABNORMAL HIGH (ref 11.5–15.5)
WBC: 10.6 10*3/uL — ABNORMAL HIGH (ref 4.0–10.5)
nRBC: 0 % (ref 0.0–0.2)

## 2020-01-17 LAB — RENAL FUNCTION PANEL
Albumin: 1.8 g/dL — ABNORMAL LOW (ref 3.5–5.0)
Anion gap: 15 (ref 5–15)
BUN: 65 mg/dL — ABNORMAL HIGH (ref 8–23)
CO2: 21 mmol/L — ABNORMAL LOW (ref 22–32)
Calcium: 7.4 mg/dL — ABNORMAL LOW (ref 8.9–10.3)
Chloride: 96 mmol/L — ABNORMAL LOW (ref 98–111)
Creatinine, Ser: 10.71 mg/dL — ABNORMAL HIGH (ref 0.61–1.24)
GFR calc Af Amer: 5 mL/min — ABNORMAL LOW (ref >60)
GFR calc non Af Amer: 4 mL/min — ABNORMAL LOW (ref >60)
Glucose, Bld: 120 mg/dL — ABNORMAL HIGH (ref 70–99)
Phosphorus: 7.7 mg/dL — ABNORMAL HIGH (ref 2.5–4.6)
Potassium: 4.5 mmol/L (ref 3.5–5.1)
Sodium: 132 mmol/L — ABNORMAL LOW (ref 135–145)

## 2020-01-17 LAB — TYPE AND SCREEN
ABO/RH(D): AB POS
Antibody Screen: NEGATIVE
Unit division: 0

## 2020-01-17 LAB — GLUCOSE, CAPILLARY
Glucose-Capillary: 92 mg/dL (ref 70–99)
Glucose-Capillary: 137 mg/dL — ABNORMAL HIGH (ref 70–99)

## 2020-01-17 LAB — VANCOMYCIN, RANDOM: Vancomycin Rm: 19

## 2020-01-17 LAB — BPAM RBC
Blood Product Expiration Date: 202109082359
ISSUE DATE / TIME: 202108151352
Unit Type and Rh: 8400

## 2020-01-17 MED ORDER — THIAMINE HCL 100 MG/ML IJ SOLN
500.0000 mg | Freq: Every day | INTRAVENOUS | Status: AC
  Administered 2020-01-17 – 2020-01-19 (×3): 500 mg via INTRAVENOUS
  Filled 2020-01-17 (×3): qty 5

## 2020-01-17 MED ORDER — DARBEPOETIN ALFA 100 MCG/0.5ML IJ SOSY
PREFILLED_SYRINGE | INTRAMUSCULAR | Status: AC
  Administered 2020-01-17: 100 ug via INTRAVENOUS
  Filled 2020-01-17: qty 0.5

## 2020-01-17 MED ORDER — VANCOMYCIN HCL IN DEXTROSE 750-5 MG/150ML-% IV SOLN
INTRAVENOUS | Status: AC
  Administered 2020-01-17: 750 mg via INTRAVENOUS
  Filled 2020-01-17: qty 150

## 2020-01-17 MED ORDER — QUETIAPINE FUMARATE 25 MG PO TABS
12.5000 mg | ORAL_TABLET | Freq: Every day | ORAL | Status: DC
  Administered 2020-01-18: 12.5 mg via ORAL
  Filled 2020-01-17 (×3): qty 1

## 2020-01-17 NOTE — Plan of Care (Signed)
  Problem: Health Behavior/Discharge Planning: Goal: Ability to manage health-related needs will improve Outcome: Not Progressing   Problem: Activity: Goal: Risk for activity intolerance will decrease Outcome: Not Progressing   Problem: Safety: Goal: Ability to remain free from injury will improve Outcome: Not Progressing   

## 2020-01-17 NOTE — Progress Notes (Signed)
Progress Note    [name].  YOV:[id number] DOB: 1951-04-25  DOA: 01/04/2020 PCP: [facility]    Brief Narrative:    Medical records reviewed and are as summarized below:  [name]. is an 69 y.o. male with history of ESRD on hemodialysis on Monday Wednesday Friday, hypertension, diabetes mellitus type 2, anemia history of colon cancer status post resection has been having increasing headache back pain over the last week or so.  Patient had come to the ER about 7 days ago with headache at that time CT head was unremarkable and was discharged on carbamazepine for possible trigeminal neuralgia.  Since then patient has become more weak and confused but has not taken his medication which was prescribed.  Patient also was unable to get out of the tub on Saturday that is about 3 days ago.  Since patient has been worsening pain patient was brought to the ER again.  Pain is mostly in the head and upper back and neck.  Per patient's daughter patient also was finding it difficult to ambulate because of the weakness.  Assessment/Plan:   Principal Problem:   MRSA bacteremia Active Problems:   TOBACCO ABUSE   OBSTRUCTIVE SLEEP APNEA   Colon cancer, status Post resection    HYPERTENSION   ESRD (end stage renal disease) ([place])   Epidural abscess   Acute encephalopathy   Sepsis ([place])   Bacteremia associated with intravascular line ([place])   Hemodialysis patient ([place])   Acute hypoxic respiratory failure with compromised airway secondary to acute encephalopathy  Patient extubated on 7/41  Acute metabolic toxic encephalopathy: CT Head and MRI brain negative for acute intracranial abnormalities; Suspect to be secondary to sepsis vs uremia. He had HD sessions per MWF schedule with improvement in mental status. -seroquel Qhs for sundowning -see below regarding thiamine  MRSA bacteremia  Epidural abscess s/p laminectomy: - Continued on vancomycin and rifampin x 8 weeks  total per ID  -Leukocytosis: continues to improve  ESRD on HD: - HD per nephrology -has temp in HD cath in right neck-- defer access issues to renal  Anemia of chronic disease: No signs of active bleeding.  Suspect secondary to acute illness and anemia of chronic disease.  -aranesp per nephrology  -stool for occult blood -has been ordered 1 unit PRBC overnight  Type 2 DM with hyperglycemia - Levemir + SSI   Alcohol abuse/cocaine abuse -encourage cessation -thiamin high dose x 3 days    Family Communication/Anticipated D/C date and plan/Code Status   DVT prophylaxis: Lovenox ordered. Code Status: Full Code.  Disposition Plan: Status is: Inpatient Attempted to call family to determine prior baseline, no anser Remains inpatient appropriate because:IV treatments appropriate due to intensity of illness or inability to take PO   Dispo: The patient is from: Home              Anticipated d/c is to: Home vs SNF              Anticipated d/c date is: 2 days              Patient currently is not medically stable to d/c.         Medical Consultants:    PCCM  ID  renal   Subjective:  Patient back from HD Per note from nurse he had some confusion last PM  Objective:    Vitals:   01/17/20 1000 01/17/20 1030 01/17/20 1100 01/17/20 1247  BP: (!) 142/70 Marland Kitchen)  165/70 139/68 (!) 163/61  Pulse: 87 86 90 91  Resp: '16 16 16 18  ' Temp:    98.6 F (37 C)  TempSrc:    Oral  SpO2:    96%  Weight:      Height:        Intake/Output Summary (Last 24 hours) at 01/17/2020 1324 Last data filed at 01/16/2020 2100 Gross per 24 hour  Intake 480 ml  Output --  Net 480 ml   Filed Weights   01/12/20 1105 01/13/20 0500 01/14/20 0338  Weight: 81.7 kg 91.2 kg 96.4 kg    Exam:  General: Appearance:     Overweight male in no acute distress     Lungs:      respirations unlabored  Heart:    Normal heart rate. Normal rhythm.    MS:   All extremities are intact.   Neurologic:    Awake, alert, oriented x 3. No apparent focal neurological           defect.      Data Reviewed:   I have personally reviewed following labs and imaging studies:  Labs: Labs show the following:   Basic Metabolic Panel: Recent Labs  Lab 01/13/20 0413 01/13/20 0413 01/14/20 0330 01/14/20 0330 01/15/20 0410 01/15/20 0410 01/16/20 0338 01/17/20 0349  NA 134*  --  131*  --  134*  --  132* 132*  K 3.9   < > 3.7   < > 4.2   < > 4.0 4.5  CL 94*  --  91*  --  97*  --  97* 96*  CO2 26  --  25  --  25  --  24 21*  GLUCOSE 245*  --  112*  --  77  --  151* 120*  BUN 60*  --  83*  --  38*  --  55* 65*  CREATININE 6.16*  --  8.35*  --  5.78*  --  8.40* 10.71*  CALCIUM 7.5*  --  7.7*  --  7.7*  --  7.5* 7.4*  PHOS 6.0*  --  6.8*  --  5.2*  --  6.2* 7.7*   < > = values in this interval not displayed.   GFR Estimated Creatinine Clearance: 7.9 mL/min (A) (by C-G formula based on SCr of 10.71 mg/dL (H)). Liver Function Tests: Recent Labs  Lab 01/10/20 1355 01/11/20 0202 01/13/20 0413 01/14/20 0330 01/15/20 0410 01/16/20 0338 01/17/20 0349  AST 27  --  30  --   --   --   --   ALT 24  --  20  --   --   --   --   ALKPHOS 98  --  77  --   --   --   --   BILITOT 2.5*  --  2.3*  --   --   --   --   PROT 7.6  --  6.5  --   --   --   --   ALBUMIN 1.9*   < > 1.8* 1.9* 1.7* 1.7* 1.8*   < > = values in this interval not displayed.   No results for input(s): LIPASE, AMYLASE in the last 168 hours. Recent Labs  Lab 01/10/20 1355  AMMONIA 24   Coagulation profile No results for input(s): INR, PROTIME in the last 168 hours.  CBC: Recent Labs  Lab 01/13/20 0413 01/14/20 0330 01/15/20 0410 01/16/20 0338 01/17/20 0349  WBC 22.3* 27.0* 13.0* 11.1*  10.6*  HGB 7.5* 7.5* 7.2* 6.9* 7.7*  HCT 22.7* 22.6* 21.8* 21.1* 23.5*  MCV 81.4 79.9* 82.0 82.4 81.9  PLT 271 334 228 224 220   Cardiac Enzymes: No results for input(s): CKTOTAL, CKMB, CKMBINDEX, TROPONINI in the last 168 hours. BNP  (last 3 results) No results for input(s): PROBNP in the last 8760 hours. CBG: Recent Labs  Lab 01/16/20 0927 01/16/20 1214 01/16/20 1622 01/16/20 1947 01/17/20 1256  GLUCAP 90 146* 80 170* 92   D-Dimer: No results for input(s): DDIMER in the last 72 hours. Hgb A1c: No results for input(s): HGBA1C in the last 72 hours. Lipid Profile: No results for input(s): CHOL, HDL, LDLCALC, TRIG, CHOLHDL, LDLDIRECT in the last 72 hours. Thyroid function studies: No results for input(s): TSH, T4TOTAL, T3FREE, THYROIDAB in the last 72 hours.  Invalid input(s): FREET3 Anemia work up: No results for input(s): VITAMINB12, FOLATE, FERRITIN, TIBC, IRON, RETICCTPCT in the last 72 hours. Sepsis Labs: Recent Labs  Lab 01/14/20 0330 01/15/20 0410 01/16/20 0338 01/17/20 0349  WBC 27.0* 13.0* 11.1* 10.6*    Microbiology Recent Results (from the past 240 hour(s))  Culture, blood (Routine X 2) w Reflex to ID Panel     Status: None   Collection Time: 01/07/20  7:15 PM   Specimen: BLOOD  Result Value Ref Range Status   Specimen Description BLOOD BLOOD RIGHT FOREARM  Final   Special Requests   Final    BOTTLES DRAWN AEROBIC AND ANAEROBIC Blood Culture adequate volume   Culture   Final    NO GROWTH 5 DAYS Performed at [facility], [place] [address] [place], [place] [address]    Report Status 01/12/2020 FINAL  Final  Culture, blood (Routine X 2) w Reflex to ID Panel     Status: None   Collection Time: 01/07/20  7:26 PM   Specimen: BLOOD RIGHT HAND  Result Value Ref Range Status   Specimen Description BLOOD RIGHT HAND  Final   Special Requests   Final    BOTTLES DRAWN AEROBIC AND ANAEROBIC Blood Culture adequate volume   Culture   Final    NO GROWTH 5 DAYS Performed at [facility], [place] [address] [place], [place] [address]    Report Status 01/12/2020 FINAL  Final  MRSA PCR Screening     Status: None   Collection Time: 01/10/20  2:20 PM   Specimen: Nasopharyngeal  Result Value  Ref Range Status   MRSA by PCR NEGATIVE NEGATIVE Final    Comment:        The GeneXpert MRSA Assay (FDA approved for NASAL specimens only), is one component of a comprehensive MRSA colonization surveillance program. It is not intended to diagnose MRSA infection nor to guide or monitor treatment for MRSA infections. Performed at [facility], [place] [address] [place],  [address]     Procedures and diagnostic studies:  No results found.  Medications:   . (feeding supplement) PROSource Plus  30 mL Oral BID BM  . atorvastatin  20 mg Oral QHS  . chlorhexidine gluconate (MEDLINE KIT)  15 mL Mouth Rinse BID  . Chlorhexidine Gluconate Cloth  6 each Topical Q0600  . Chlorhexidine Gluconate Cloth  6 each Topical Q0600  . darbepoetin (ARANESP) injection - DIALYSIS  100 mcg Intravenous Q Mon-HD  . enoxaparin (LOVENOX) injection  30 mg Subcutaneous Q24H  . feeding supplement (NEPRO CARB STEADY)  237 mL Oral BID BM  . HYDROcodone-acetaminophen  1 tablet  Oral Q6H  . insulin aspart  0-9 Units Subcutaneous Q4H  . insulin detemir  11 Units Subcutaneous BID  . metoprolol tartrate  25 mg Oral BID  . multivitamin  1 tablet Oral QHS  . rifampin  300 mg Oral Q12H  . sevelamer carbonate  1,600 mg Oral TID WC  . sodium chloride flush  10-40 mL Intracatheter Q12H   Continuous Infusions: . sodium chloride 10 mL/hr at 01/13/20 1700  . thiamine injection    . vancomycin 750 mg (01/17/20 1044)     LOS: 13 days   Geradine Girt  [facility]   How to contact the [facility] Attending or Consulting provider [place] or covering provider during after hours [place], for this patient?  1. Check the care team in [facility] and look for a) attending/consulting [facility] provider listed and b) the [facility] team listed 2. Log into [email or web address] and use [place]'s universal password to access. If you do not have the password, please contact the hospital operator. 3. Locate the [facility] provider you are looking for  under [facility] and page to a number that you can be directly reached. 4. If you still have difficulty reaching the provider, please page the [facility] (Director on Call) for the Hospitalists listed on amion for assistance.  01/17/2020, 1:24 PM

## 2020-01-17 NOTE — Progress Notes (Signed)
PT Cancellation Note  Patient Details Name: [name]. MRN: [id number] DOB: 28-Aug-1950   Cancelled Treatment:    Reason Eval/Treat Not Completed: Patient declined, no reason specified. Despite significant encouragement from the pt's daughter, RN, and PT, the pt continued to decline PT session at this time stating he was too uncomfortable and sore to participate. The pt was educated on the importance of mobility for maintaining and regaining strength as it is his goal to return home rather than going to rehab. However, the pt continued to decline therapy at this time, even bed-level exercises. PT will continue to follow and attempt to treat as time/schedule allow.   Karma Ganja, PT, DPT   Acute Rehabilitation Department Pager #: [phone]   Otho Bellows 01/17/2020, 5:15 PM

## 2020-01-17 NOTE — Plan of Care (Signed)

## 2020-01-17 NOTE — Procedures (Signed)
I was present at this dialysis session. I have reviewed the session itself and made appropriate changes.   Filed Weights   01/12/20 1105 01/13/20 0500 01/14/20 0338  Weight: 81.7 kg 91.2 kg 96.4 kg    Recent Labs  Lab 01/17/20 0349  NA 132*  K 4.5  CL 96*  CO2 21*  GLUCOSE 120*  BUN 65*  CREATININE 10.71*  CALCIUM 7.4*  PHOS 7.7*    Recent Labs  Lab 01/15/20 0410 01/16/20 0338 01/17/20 0349  WBC 13.0* 11.1* 10.6*  HGB 7.2* 6.9* 7.7*  HCT 21.8* 21.1* 23.5*  MCV 82.0 82.4 81.9  PLT 228 224 220    Scheduled Meds:  (feeding supplement) PROSource Plus  30 mL Oral BID BM   atorvastatin  20 mg Oral QHS   chlorhexidine gluconate (MEDLINE KIT)  15 mL Mouth Rinse BID   Chlorhexidine Gluconate Cloth  6 each Topical Q0600   Chlorhexidine Gluconate Cloth  6 each Topical Q0600   darbepoetin (ARANESP) injection - DIALYSIS  100 mcg Intravenous Q Mon-HD   enoxaparin (LOVENOX) injection  30 mg Subcutaneous Q24H   feeding supplement (NEPRO CARB STEADY)  237 mL Oral BID BM   HYDROcodone-acetaminophen  1 tablet Oral Q6H   insulin aspart  0-9 Units Subcutaneous Q4H   insulin detemir  11 Units Subcutaneous BID   metoprolol tartrate  25 mg Oral BID   multivitamin  1 tablet Oral QHS   rifampin  300 mg Oral Q12H   sevelamer carbonate  1,600 mg Oral TID WC   sodium chloride flush  10-40 mL Intracatheter Q12H   Continuous Infusions:  sodium chloride 10 mL/hr at 01/13/20 1700   sodium chloride     sodium chloride     sodium chloride     sodium chloride     vancomycin Stopped (01/14/20 1151)   PRN Meds:.sodium chloride, sodium chloride, sodium chloride, sodium chloride, acetaminophen, alteplase, clonazePAM, heparin, hydrALAZINE, lidocaine (PF), lidocaine (PF), lidocaine-prilocaine, lidocaine-prilocaine, [DISCONTINUED] ondansetron **OR** ondansetron (ZOFRAN) IV, pentafluoroprop-tetrafluoroeth, pentafluoroprop-tetrafluoroeth, polyethylene glycol, senna-docusate,  sodium chloride flush    Gean Quint, MD [facility] 01/17/2020, 8:31 AM

## 2020-01-17 NOTE — Progress Notes (Signed)
[facility] for Infectious Disease    Date of Admission:  01/04/2020   Total days of antibiotics 13        Day 8 RIF            ID: [name]. is a 69 y.o. male with mrsa bacteremia with cervical spine epidural abscess s/p debridement and HW placement His leukocytosis has decreased and nearly resolved with today's level 10.6. He remains afebrile and making progress.    Principal Problem:   MRSA bacteremia Active Problems:   TOBACCO ABUSE   OBSTRUCTIVE SLEEP APNEA   Colon cancer, status Post resection    HYPERTENSION   ESRD (end stage renal disease) ([place])   Epidural abscess   Acute encephalopathy   Sepsis ([place])   Bacteremia associated with intravascular line ([place])   Hemodialysis patient ([place])   Assessment/Plan: Cervical epidural abscess s/p debridement & Fusion =  Will treat for 8 wk through October 1 with vancomycin after HD, plus rifampin PO BID  He will need suppression with oral antibiotics given hardware placed in the setting of infection   MRSA Bacteremia = secondary to #1. TTE was negative and no findings of significant valvular insufficiency/stenosis. No absolute indication for TEE with no minor criteria identified on TTE and already prolonged indication to treat.   Leukocytosis = resolving nicely.    Respiratory Failure = resolved    Long term medication management = Please check LFT weekly when on rifampin. ESR / CRP and vanc level weekly.     Follow up in ID office has been arranged midway through treatment to ensure safety labs are in order and he is tolerating Rifampin and no DDIs.   September 2nd @ 11:00 am with Rodney Madeira, NP   Will plan to sign off for now but happy to see him back inpatient should something change in his condition. Otherwise will look forward to seeing him outpatient.   Rodney Madeira, MSN, NP-C [facility] for Infectious Disease [email or web address] Pager:  [phone] Office: [phone] [place]: [phone]

## 2020-01-17 NOTE — Progress Notes (Signed)
Pharmacy Antibiotic Note  [name]. is a 69 y.o. male admitted on 01/04/2020 with MRSA bacteremia/epidural abscess of C spine. Pharmacy has been consulted for vancomycin dosing. Patient is ESRD with HD on MWF.   8/16 Vanc random = 19, which is at goal 15-25. Patient is afebrile, WBC 10.6. No changes to vancomycin dosing needed. Abx LOT is 8 weeks.   Plan: - Continue vancomycin 750mg  IV MWF after HD - Draw vanc level on Monday 8/23 - Monitor clinical progress and HD schedule  Height: 5' 11.5" (181.6 cm) Weight: 96.4 kg (212 lb 8.4 oz) IBW/kg (Calculated) : 76.45  Temp (24hrs), Avg:98.5 F (36.9 C), Min:98.1 F (36.7 C), Max:98.7 F (37.1 C)  Recent Labs  Lab 01/11/20 1140 01/12/20 0403 01/13/20 0413 01/14/20 0330 01/15/20 0410 01/16/20 0338 01/17/20 0349  WBC  --    < > 22.3* 27.0* 13.0* 11.1* 10.6*  CREATININE  --    < > 6.16* 8.35* 5.78* 8.40* 10.71*  VANCORANDOM 22  --   --   --   --   --  19   < > = values in this interval not displayed.    Estimated Creatinine Clearance: 7.9 mL/min (A) (by C-G formula based on SCr of 10.71 mg/dL (H)).    No Known Allergies  Antimicrobials this admission: vancomycin 8/4 >>   Dose adjustments this admission:  Microbiology results: 8/3 BCx: 4/4 MRSA 8/3 UCx: No growth  8/5 Wound: MRSA 8/6 Repeat BCx: No growth <12 h  [name] A. Levada Dy, PharmD, BCPS, FNKF Clinical Pharmacist [place] Please utilize Amion for appropriate phone number to reach the unit pharmacist ([place])

## 2020-01-17 NOTE — Progress Notes (Signed)
[facility] Progress Note   Subjective:   Patient seen on hd, no complaints. Difficult to cannulate lue avf however eventually got it.  Objective Vitals:   01/16/20 1415 01/16/20 1631 01/16/20 1948 01/17/20 0510  BP: (!) 167/64 (!) 158/69 (!) 170/72 (!) 174/69  Pulse: 75 75 84 83  Resp: '16 16 18 16  ' Temp: 98.5 F (36.9 C) 98.7 F (37.1 C) 98.6 F (37 C) 98.7 F (37.1 C)  TempSrc: Oral Oral Oral Oral  SpO2: 100% 100% 100% 100%  Weight:      Height:       Physical Exam General: Well developed male, alert and in NAD.  Heart: RRR, no murmurs, rubs or gallops Lungs: CTA bilaterally without wheezing, rhonchi or rales Abdomen: Soft, non-tender, non-distended. +BS Extremities: No edema b/l lower extremities Dialysis Access: L AVF w/ improving infiltration (swelling medial aspect), +bruit, R IJ temp HD cath in place (c/d/i)  Additional Objective Labs: Basic Metabolic Panel: Recent Labs  Lab 01/15/20 0410 01/16/20 0338 01/17/20 0349  NA 134* 132* 132*  K 4.2 4.0 4.5  CL 97* 97* 96*  CO2 25 24 21*  GLUCOSE 77 151* 120*  BUN 38* 55* 65*  CREATININE 5.78* 8.40* 10.71*  CALCIUM 7.7* 7.5* 7.4*  PHOS 5.2* 6.2* 7.7*   Liver Function Tests: Recent Labs  Lab 01/10/20 1355 01/11/20 0202 01/13/20 0413 01/14/20 0330 01/15/20 0410 01/16/20 0338 01/17/20 0349  AST 27  --  30  --   --   --   --   ALT 24  --  20  --   --   --   --   ALKPHOS 98  --  77  --   --   --   --   BILITOT 2.5*  --  2.3*  --   --   --   --   PROT 7.6  --  6.5  --   --   --   --   ALBUMIN 1.9*   < > 1.8*   < > 1.7* 1.7* 1.8*   < > = values in this interval not displayed.   No results for input(s): LIPASE, AMYLASE in the last 168 hours. CBC: Recent Labs  Lab 01/13/20 0413 01/13/20 0413 01/14/20 0330 01/14/20 0330 01/15/20 0410 01/16/20 0338 01/17/20 0349  WBC 22.3*   < > 27.0*   < > 13.0* 11.1* 10.6*  HGB 7.5*   < > 7.5*   < > 7.2* 6.9* 7.7*  HCT 22.7*   < > 22.6*   < > 21.8*  21.1* 23.5*  MCV 81.4  --  79.9*  --  82.0 82.4 81.9  PLT 271   < > 334   < > 228 224 220   < > = values in this interval not displayed.   Blood Culture    Component Value Date/Time   SDES BLOOD RIGHT HAND 01/07/2020 1926   SPECREQUEST  01/07/2020 1926    BOTTLES DRAWN AEROBIC AND ANAEROBIC Blood Culture adequate volume   CULT  01/07/2020 1926    NO GROWTH 5 DAYS Performed at [facility], [place] [address] [place], [place] [address]    REPTSTATUS 01/12/2020 FINAL 01/07/2020 1926    CBG: Recent Labs  Lab 01/16/20 0435 01/16/20 0927 01/16/20 1214 01/16/20 1622 01/16/20 1947  GLUCAP 134* 90 146* 80 170*   Medications: . sodium chloride 10 mL/hr at 01/13/20 1700  . sodium chloride    . sodium chloride    .  sodium chloride    . sodium chloride    . vancomycin Stopped (01/14/20 1151)   . (feeding supplement) PROSource Plus  30 mL Oral BID BM  . atorvastatin  20 mg Oral QHS  . chlorhexidine gluconate (MEDLINE KIT)  15 mL Mouth Rinse BID  . Chlorhexidine Gluconate Cloth  6 each Topical Q0600  . Chlorhexidine Gluconate Cloth  6 each Topical Q0600  . darbepoetin (ARANESP) injection - DIALYSIS  100 mcg Intravenous Q Mon-HD  . enoxaparin (LOVENOX) injection  30 mg Subcutaneous Q24H  . feeding supplement (NEPRO CARB STEADY)  237 mL Oral BID BM  . HYDROcodone-acetaminophen  1 tablet Oral Q6H  . insulin aspart  0-9 Units Subcutaneous Q4H  . insulin detemir  11 Units Subcutaneous BID  . metoprolol tartrate  25 mg Oral BID  . multivitamin  1 tablet Oral QHS  . rifampin  300 mg Oral Q12H  . sevelamer carbonate  1,600 mg Oral TID WC  . sodium chloride flush  10-40 mL Intracatheter Q12H    Dialysis Orders: [facility] MWF 4h 8mn 99kg 2/2 bath RIJ TDC (removed)/ LUA AVF Hep none - Calcitriol 1.288m PO q HD - Mircera 150 mcg IV q 2 weeks (last given on 12/29/19)  Assessment/Plan: 1. Epidural abscess:MRI showed abscess extending from C4-T2 with cord compression, seen by  NSurg and went to OR for C4-T2 for decomp/ laminectomy/ fusion on 01/06/20 with improved pain.  2. MRSA bacteremia:Repeat BCx 8/6 negative. TTE 8/6 without vegetation. TDC removed 8/6, using LUE AVF until infiltrated 8/9, was using temp HD cath. IV vanc and rifampin per ID.  3. Resp failure - intubated8/9 -8/12. Off vent and stable 4. Confusion: azotemia resolved w/ dialysis, seems improved but still demonstrates short term memory impairment. Not sure the cause.  5. ESRD: HD on MWF schedule. 6. Infiltrated AVF: still a little difficult to cannulate however able to get it, at this junction would leave temp line in for now and if able to cannulate again then can remove it. 7. HTN/volume: BP's wnl,2-3 kg under dry, no volume excess on exam.  8. Anemia: Hgb 6.9 now 7.7, PRBC and FOBT already ordered. Increased aranesp dose 8/16 9. Secondary hyperparathyroidism:Phos high - started on Renvela 160043mID on 8/8, phos now 7.7. restrict phos in diet  VikGean QuintD [place]16/2021, 8:31 AM

## 2020-01-18 LAB — GLUCOSE, CAPILLARY
Glucose-Capillary: 137 mg/dL — ABNORMAL HIGH (ref 70–99)
Glucose-Capillary: 135 mg/dL — ABNORMAL HIGH (ref 70–99)
Glucose-Capillary: 163 mg/dL — ABNORMAL HIGH (ref 70–99)
Glucose-Capillary: 137 mg/dL — ABNORMAL HIGH (ref 70–99)
Glucose-Capillary: 112 mg/dL — ABNORMAL HIGH (ref 70–99)
Glucose-Capillary: 102 mg/dL — ABNORMAL HIGH (ref 70–99)

## 2020-01-18 LAB — RENAL FUNCTION PANEL
Albumin: 1.7 g/dL — ABNORMAL LOW (ref 3.5–5.0)
Anion gap: 12 (ref 5–15)
BUN: 31 mg/dL — ABNORMAL HIGH (ref 8–23)
CO2: 26 mmol/L (ref 22–32)
Calcium: 7.5 mg/dL — ABNORMAL LOW (ref 8.9–10.3)
Chloride: 96 mmol/L — ABNORMAL LOW (ref 98–111)
Creatinine, Ser: 6.75 mg/dL — ABNORMAL HIGH (ref 0.61–1.24)
GFR calc Af Amer: 9 mL/min — ABNORMAL LOW (ref >60)
GFR calc non Af Amer: 8 mL/min — ABNORMAL LOW (ref >60)
Glucose, Bld: 168 mg/dL — ABNORMAL HIGH (ref 70–99)
Phosphorus: 4.9 mg/dL — ABNORMAL HIGH (ref 2.5–4.6)
Potassium: 3.7 mmol/L (ref 3.5–5.1)
Sodium: 134 mmol/L — ABNORMAL LOW (ref 135–145)

## 2020-01-18 MED ORDER — LIDOCAINE 5 % EX PTCH
1.0000 | MEDICATED_PATCH | CUTANEOUS | Status: DC
  Administered 2020-01-18: 1 via TRANSDERMAL
  Filled 2020-01-18 (×3): qty 1

## 2020-01-18 MED ORDER — AMLODIPINE BESYLATE 10 MG PO TABS
10.0000 mg | ORAL_TABLET | Freq: Every day | ORAL | Status: DC
  Administered 2020-01-18 – 2020-01-24 (×5): 10 mg via ORAL
  Filled 2020-01-18 (×6): qty 1

## 2020-01-18 MED ORDER — CHLORHEXIDINE GLUCONATE CLOTH 2 % EX PADS
6.0000 | MEDICATED_PAD | Freq: Every day | CUTANEOUS | Status: DC
  Administered 2020-01-18 – 2020-01-23 (×3): 6 via TOPICAL

## 2020-01-18 NOTE — Progress Notes (Signed)
Physical Therapy Treatment Patient Details Name: [name]. MRN: [id number] DOB: December 07, 1950 Today's Date: 01/18/2020    History of Present Illness Rodney Raheen Capili. is a 69 y.o. male admitted 01/04/20 secondary to worsening headache and back pain. Found to have epidural abscess with associated cord compression in addition to MRSA positive bacteremia. S/p C4-T2 laminectomy and fusion on 8/5. Transferred to ICU 8/9 due to AMS. Intubated 8/10-8/12 due to worsening mental and respiratory status. Head CT and MRI negative for acute injury. PMH includes ESRD (HD MWF), DM, HTN, anemia, colon CA s/p resection.   PT Comments    Pt remains limited by high pain level, generalized weakness and poor activity tolerance. Requires assist to sit EOB, but only able to tolerate momentarily until requiring return to supine due to pain. Bed mobility improving, although pt with apparent uncoordinated movement. Demonstrates decreased attention, difficulty problem solving and very poor awareness. Continue to recommend SNF-level therapies to maximize functional mobility and independence prior to return.    Follow Up Recommendations  SNF;Supervision for mobility/OOB     Equipment Recommendations   (defer)    Recommendations for Other Services       Precautions / Restrictions Precautions Precautions: Fall;Cervical Precaution Booklet Issued: No Required Braces or Orthoses: Cervical Brace Cervical Brace: Hard collar (when upright or OOB) Restrictions Weight Bearing Restrictions: No    Mobility  Bed Mobility Overal bed mobility: Needs Assistance Bed Mobility: Supine to Sit;Sit to Supine     Supine to sit: Mod assist Sit to supine: Min assist   General bed mobility comments: assist to raise trunk and to guide trunk when returning to supine; poor safety awareness and trunk control, close guarding to keep from coming off EOB  Transfers                 General transfer comment: Deferred, pt  declining limited by pain  Ambulation/Gait                 Stairs             Wheelchair Mobility    Modified Rankin (Stroke Patients Only)       Balance Overall balance assessment: Needs assistance Sitting-balance support: Bilateral upper extremity supported;Feet supported Sitting balance-Leahy Scale: Poor Sitting balance - Comments: pt with poor tolerance, unable to accurately assess sitting balance                                    Cognition Arousal/Alertness: Awake/alert Behavior During Therapy: Impulsive;Anxious Overall Cognitive Status: No family/caregiver present to determine baseline cognitive functioning Area of Impairment: Safety/judgement;Following commands;Memory;Attention;Problem solving;Awareness                   Current Attention Level: Sustained Memory: Decreased recall of precautions;Decreased short-term memory Following Commands: Follows one step commands with increased time;Follows one step commands inconsistently Safety/Judgement: Decreased awareness of safety;Decreased awareness of deficits Awareness: Intellectual;Emergent Problem Solving: Slow processing;Difficulty sequencing;Requires verbal cues;Requires tactile cues General Comments: pt with poor insight into his decreased mobility and insists he is going home not to rehab      Exercises      General Comments General comments (skin integrity, edema, etc.): max encouragement on importance of OOB mobility but pt remains limited by pain. Encouraged donning brace for pain relief with upright, pt declining. Pt requesting assist for all aspects of care (i.e. removing ice pack from shoulder), requiring encouragement to  perform mobility/ADLs as independently as possible      Pertinent Vitals/Pain Pain Assessment: Faces Faces Pain Scale: Hurts worst Pain Location: sides of neck and shoulders Pain Descriptors / Indicators: Grimacing;Guarding;Sharp;Restless Pain  Intervention(s): Monitored during session;Patient requesting pain meds-RN notified;Ice applied;Limited activity within patient's tolerance    Home Living                      Prior Function            PT Goals (current goals can now be found in the care plan section) Acute Rehab PT Goals Patient Stated Goal: to get this pain better Progress towards PT goals: Not progressing toward goals - comment (limited by pain)    Frequency    Min 3X/week      PT Plan Frequency needs to be updated    Co-evaluation              AM-PAC PT "6 Clicks" Mobility   Outcome Measure  Help needed turning from your back to your side while in a flat bed without using bedrails?: A Lot Help needed moving from lying on your back to sitting on the side of a flat bed without using bedrails?: A Lot Help needed moving to and from a bed to a chair (including a wheelchair)?: A Lot Help needed standing up from a chair using your arms (e.g., wheelchair or bedside chair)?: A Lot Help needed to walk in hospital room?: Total Help needed climbing 3-5 steps with a railing? : Total 6 Click Score: 10    End of Session Equipment Utilized During Treatment: Cervical collar Activity Tolerance: Patient limited by pain Patient left: in bed;with call bell/phone within reach;with bed alarm set Nurse Communication: Mobility status PT Visit Diagnosis: Pain;Muscle weakness (generalized) (M62.81);Difficulty in walking, not elsewhere classified (R26.2)     Time: 0160-1093 PT Time Calculation (min) (ACUTE ONLY): 24 min  Charges:  $Therapeutic Activity: 8-22 mins                     Mabeline Caras, PT, DPT Acute Rehabilitation Services  Pager [phone] Office [place] 01/18/2020, 1:53 PM

## 2020-01-18 NOTE — Plan of Care (Signed)

## 2020-01-18 NOTE — TOC Initial Note (Signed)
Transition of Care (TOC) - Initial/Assessment Note    Patient Details  Name: [name]. MRN: [id number] Date of Birth: 1950/12/20  Transition of Care [facility]) CM/SW Contact:    Curlene Labrum, RN Phone Number: 01/18/2020, 9:19 AM  Clinical Narrative:                 Case management met with the patient and daughter, [name] at the bedside to discuss transitions of care to a SNF facility.  The patient will need 8 weeks of antibiotics and PT - S/P laminectomy and epidural abscess.  I called and confirmed with the [facility]  - April in admissions that the patient was admitted to [facility] 13 days ago and will be needs admission and transfer to a SNF facility - [facility] directed patient to use his Medicare A and B for admission and choice of SNF facility - Medicare # was confirmed with [facility] - #[id number].  I spoke with the daughter, [name] and will place a SNF workup and will present available bed offers to family considering [place] facilities that have available bed for HD capability and patient's history of denial for COVID vaccine according to the family.  Will continue to follow for transfer to SNF.  Expected Discharge Plan: Skilled Nursing Facility Barriers to Discharge: Continued Medical Work up   Patient Goals and CMS Choice Patient states their goals for this hospitalization and ongoing recovery are:: Patient plans to discharge to SNF for 8 weeks of IV antibiotics. CMS Medicare.gov Compare Post Acute Care list provided to:: Patient Represenative (must comment) (daughter, [name]) Choice offered to / list presented to : [facility] POA / Guardian  Expected Discharge Plan and Services Expected Discharge Plan: [place]   Discharge Planning Services: CM Consult Post Acute Care Choice: [place]                                        Prior Living Arrangements/Services   Lives with:: Adult Children Patient language and need for interpreter reviewed::  Yes Do you feel safe going back to the place where you live?: Yes      Need for Family Participation in Patient Care: Yes (Comment) Care giver support system in place?: Yes (comment)   Criminal Activity/Legal Involvement Pertinent to Current Situation/Hospitalization: No - Comment as needed  Activities of Daily Living Home Assistive Devices/Equipment: Eyeglasses, CBG Meter ADL Screening (condition at time of admission) Patient's cognitive ability adequate to safely complete daily activities?: Yes Is the patient deaf or have difficulty hearing?: No Does the patient have difficulty seeing, even when wearing glasses/contacts?: No Does the patient have difficulty concentrating, remembering, or making decisions?: No Patient able to express need for assistance with ADLs?: Yes Does the patient have difficulty dressing or bathing?: No Independently performs ADLs?: Yes (appropriate for developmental age) Does the patient have difficulty walking or climbing stairs?: Yes (sometimes when his legs get weak) Weakness of Legs: None Weakness of Arms/Hands: None  Permission Sought/Granted Permission sought to share information with : Case Manager Permission granted to share information with : Yes, Verbal Permission Granted     Permission granted to share info w AGENCY: SNF facility of choice  Permission granted to share info w Relationship: daughters - [name] and Debroah Baller     Emotional Assessment Appearance:: Appears stated age Attitude/Demeanor/Rapport: Gracious, Other (comment) (poor memory and orientation) Affect (typically observed): Accepting Orientation: :  Oriented to Self, Oriented to Place, Oriented to  Time, Fluctuating Orientation (Suspected and/or reported [place]) Alcohol / Substance Use: Not Applicable Psych Involvement: No (comment)  Admission diagnosis:  Epidural abscess [G06.2] Patient Active Problem List   Diagnosis Date Noted  . Hemodialysis patient ([place])   . Bacteremia  associated with intravascular line ([place])   . Sepsis ([place]) 01/05/2020  . MRSA bacteremia 01/05/2020  . 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 Acute encephalopathy 01/04/2020  . Symptomatic anemia 02/11/2019  . Type 2 diabetes mellitus with renal complication ([place]) 59/13/6859  . ESRD (end stage renal disease) ([place])   . Cardiac murmur 01/08/2019  . Preoperative clearance 01/08/2019  . Uncontrolled Type 2 DM 09/15/2013  . HYPERTENSION 03/08/2013  . CKD  06/24/2012  . Colon cancer, status Post resection  05/11/2012  . Malnutrition of moderate degree ([place]) 02/24/2012  . Health care maintenance 11/27/2011  . Obesity (BMI 30.0-34.9) 10/20/2011  . Cataract, bilateral 08/29/2011  . Hyperlipidemia LDL goal <70 04/14/2006  . MICROCYTIC ANEMIA 04/14/2006  . TOBACCO ABUSE 04/14/2006  . OBSTRUCTIVE SLEEP APNEA 04/14/2006   PCP:  Clinic, [place]:   [facility] [place], [address] [place] [address] Phone: [phone] Fax: [phone]     Social Determinants of Health (SDOH) Interventions    Readmission Risk Interventions Readmission Risk Prevention Plan 01/18/2020  Transportation Screening Complete  Medication Review ([place]) Complete  PCP or Specialist appointment within 3-5 days of discharge Complete  HRI or Home Care Consult Complete  SW Recovery Care/Counseling Consult Complete  Palliative Care Screening Complete  Skilled Nursing Facility Complete  Some recent data might be hidden

## 2020-01-18 NOTE — Progress Notes (Signed)
Occupational Therapy Treatment Patient Details Name: [name]. MRN: [id number] DOB: 1950-06-21 Today's Date: 01/18/2020    History of present illness Comer Tavious Griesinger. is a 69 y.o. male with PMH of ESRD on HD MWF, HTN, DM, anemia, hx of colon ca s/p resection. Patient presents secondary to worsening headache and back pain and found to have an epidural abscess with associated cord compression in addition to MRSA positive bacteremia. Vancomycin started and neurosurgery consulted for management. On 8/5, pt underwent C4-T2 laminectomy and fusion. Transferred to ICU 8/9 due to AMS. 8/10 pt intubated due to worsening mental and respiratory status. Extubated 8/12. Head CT and MRI negative   OT comments  Pt continues to demonstrate poor activity tolerance due to high pain level. Assisted to sit EOB momentarily, but pain too great to progress mobility OOB. Pt needing cues for cervical precautions and total assist for LB dressing. Pt with decreased insight into his ability to care for himself at home. Continue to recommend SNF.  Follow Up Recommendations  SNF;Supervision/Assistance - 24 hour    Equipment Recommendations  Other (comment) (defer to next venue)    Recommendations for Other Services      Precautions / Restrictions Precautions Precautions: Fall;Cervical Precaution Booklet Issued: No Required Braces or Orthoses: Cervical Brace Cervical Brace: Hard collar (when upright or OOB) Restrictions Weight Bearing Restrictions: No       Mobility Bed Mobility Overal bed mobility: Needs Assistance Bed Mobility: Supine to Sit;Sit to Supine     Supine to sit: Mod assist Sit to supine: Min assist   General bed mobility comments: assist to raise trunk and to guide trunk when returning to supine  Transfers                 General transfer comment: deferred, unable to tolerate    Balance Overall balance assessment: Needs assistance     Sitting balance - Comments: pt  with poor tolerance, unable to accurately assess sitting balance                                   ADL either performed or assessed with clinical judgement   ADL                         Lower Body Dressing Details (indicate cue type and reason): Total A to don socks bed level.               General ADL Comments: pt assisted to EOB and immediately started leaning over and attempting to return to supine due to pain before cervical brace could be donned     Vision   Vision Assessment?: Vision impaired- to be further tested in functional context   Perception     Praxis      Cognition Arousal/Alertness: Awake/alert Behavior During Therapy: Impulsive;Anxious Overall Cognitive Status: No family/caregiver present to determine baseline cognitive functioning Area of Impairment: Safety/judgement;Following commands;Memory;Attention;Problem solving;Awareness                   Current Attention Level: Sustained Memory: Decreased recall of precautions;Decreased short-term memory Following Commands: Follows one step commands with increased time Safety/Judgement: Decreased awareness of safety;Decreased awareness of deficits Awareness: Emergent Problem Solving: Slow processing;Difficulty sequencing;Requires verbal cues;Requires tactile cues General Comments: pt with poor insight into his decreased mobility and insists he is going home not to rehab  Exercises     Shoulder Instructions       General Comments      Pertinent Vitals/ Pain       Pain Assessment: Faces Faces Pain Scale: Hurts worst Pain Location: sides of neck Pain Descriptors / Indicators: Grimacing;Guarding;Sharp;Restless Pain Intervention(s): Patient requesting pain meds-RN notified;Ice applied;Repositioned;Monitored during session  Home Living                                          Prior Functioning/Environment              Frequency  Min 2X/week         Progress Toward Goals  OT Goals(current goals can now be found in the care plan section)  Progress towards OT goals: Not progressing toward goals - comment (pain continues to limit)  Acute Rehab OT Goals Patient Stated Goal: to get this pain better OT Goal Formulation: With patient Time For Goal Achievement: 01/28/20 Potential to Achieve Goals: [place] Discharge plan remains appropriate    Co-evaluation                 AM-PAC OT "6 Clicks" Daily Activity     Outcome Measure   Help from another person eating meals?: A Little Help from another person taking care of personal grooming?: A Little Help from another person toileting, which includes using toliet, bedpan, or urinal?: Total Help from another person bathing (including washing, rinsing, drying)?: A Lot Help from another person to put on and taking off regular upper body clothing?: A Lot Help from another person to put on and taking off regular lower body clothing?: Total 6 Click Score: 12    End of Session    OT Visit Diagnosis: Muscle weakness (generalized) (M62.81);Other symptoms and signs involving cognitive function;Pain   Activity Tolerance Patient limited by pain   Patient Left in bed;with call bell/phone within reach;with bed alarm set   Nurse Communication Patient requests pain meds        Time: 0102-7253 OT Time Calculation (min): 23 min  Charges: OT General Charges $OT Visit: 1 Visit OT Treatments $Therapeutic Activity: 8-22 mins  Nestor Lewandowsky, OTR/L Acute Rehabilitation Services Pager: [phone] Office: [phone]   Malka So 01/18/2020, 11:56 AM

## 2020-01-18 NOTE — Progress Notes (Signed)
[facility] Progress Note   Subjective:  Seen in room. No CP/dyspnea. Feeling antsy -- wants to get out of bed today and walk. Told will communicate with RN to have PT assess him.  Objective Vitals:   01/17/20 1247 01/17/20 2007 01/18/20 0335 01/18/20 0748  BP: (!) 163/61 (!) 154/69 (!) 149/72 (!) 151/66  Pulse: 91 85 89 87  Resp: _0 Temp: 98.6 F (37 C) 98.5 F (36.9 C) 98 F (36.7 C) 99.3 F (37.4 C)  TempSrc: Oral Oral Oral Oral  SpO2: 96% 97% 98% 94%  Weight:      Height:       Physical Exam General: Well appearing man, NAD. Room air. Heart: RRR; no murmur Lungs: CTA anteriorly Abdomen: soft, non-tender Extremities: Trace BLE edema Dialysis Access: L AVF + bruit, some edema. R IJ temp cath  Additional Objective Labs: Basic Metabolic Panel: Recent Labs  Lab 01/16/20 0338 01/17/20 0349 01/18/20 0412  NA 132* 132* 134*  K 4.0 4.5 3.7  CL 97* 96* 96*  CO2 24 21* 26  GLUCOSE 151* 120* 168*  BUN 55* 65* 31*  CREATININE 8.40* 10.71* 6.75*  CALCIUM 7.5* 7.4* 7.5*  PHOS 6.2* 7.7* 4.9*   Liver Function Tests: Recent Labs  Lab 01/13/20 0413 01/14/20 0330 01/16/20 0338 01/17/20 0349 01/18/20 0412  AST 30  --   --   --   --   ALT 20  --   --   --   --   ALKPHOS 77  --   --   --   --   BILITOT 2.3*  --   --   --   --   PROT 6.5  --   --   --   --   ALBUMIN 1.8*   < > 1.7* 1.8* 1.7*   < > = values in this interval not displayed.   CBC: Recent Labs  Lab 01/13/20 0413 01/13/20 0413 01/14/20 0330 01/14/20 0330 01/15/20 0410 01/16/20 0338 01/17/20 0349  WBC 22.3*   < > 27.0*   < > 13.0* 11.1* 10.6*  HGB 7.5*   < > 7.5*   < > 7.2* 6.9* 7.7*  HCT 22.7*   < > 22.6*   < > 21.8* 21.1* 23.5*  MCV 81.4  --  79.9*  --  82.0 82.4 81.9  PLT 271   < > 334   < > 228 224 220   < > = values in this interval not displayed.   Blood Culture    Component Value Date/Time   SDES BLOOD RIGHT HAND 01/07/2020 1926   SPECREQUEST  01/07/2020 1926     BOTTLES DRAWN AEROBIC AND ANAEROBIC Blood Culture adequate volume   CULT  01/07/2020 1926    NO GROWTH 5 DAYS Performed at [facility], [place] [address], [place], [place] [address]    REPTSTATUS 01/12/2020 FINAL 01/07/2020 1926   Medications: . sodium chloride 10 mL/hr at 01/13/20 1700  . thiamine injection 500 mg (01/18/20 0905)  . vancomycin 750 mg (01/17/20 1044)   . (feeding supplement) PROSource Plus  30 mL Oral BID BM  . amLODipine  10 mg Oral Daily  . atorvastatin  20 mg Oral QHS  . chlorhexidine gluconate (MEDLINE KIT)  15 mL Mouth Rinse BID  . Chlorhexidine Gluconate Cloth  6 each Topical Q0600  . Chlorhexidine Gluconate Cloth  6 each Topical Q0600  . darbepoetin (ARANESP) injection - DIALYSIS  100 mcg  Intravenous Q Mon-HD  . enoxaparin (LOVENOX) injection  30 mg Subcutaneous Q24H  . feeding supplement (NEPRO CARB STEADY)  237 mL Oral BID BM  . HYDROcodone-acetaminophen  1 tablet Oral Q6H  . insulin aspart  0-9 Units Subcutaneous Q4H  . insulin detemir  11 Units Subcutaneous BID  . metoprolol tartrate  25 mg Oral BID  . multivitamin  1 tablet Oral QHS  . QUEtiapine  12.5 mg Oral QHS  . rifampin  300 mg Oral Q12H  . sevelamer carbonate  1,600 mg Oral TID WC  . sodium chloride flush  10-40 mL Intracatheter Q12H   Dialysis Orders: [facility] MWF 4h 21mn 99kg 2/2 bath LUA AVF Hep none - Calcitriol 1.231m PO q HD - Mircera 150 mcg IV q 2 weeks (last given on 12/29/19)  Assessment/Plan: 1. 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  2. MRSA bacteremia:Repeat BCx 8/6 negative. TTE 8/6 without vegetation. TDC removed 8/6, using LUE AVF until infiltrated 8/9, was using temp HD cath. On IV vanc and PO Rifampin per ID.  3. Resp failure: S/p intubation/vent8/9 -8/12. Now off vent - on room air. 4. Confusion: Likely uremic, as  resolved w/ dialysis. Still with some memory issues - ?chronic. 5. ESRD: Continue HD on MWF schedule - next HD 8/18.  6. Infiltrated AVF: Difficult cannulation on 8/16 however able to get it, at this junction would leave temp line in for now. If cannulation ok tomorrow, then can remove it. 7. HTN/volume: BP higher recently with trace edema - UF as tolerated. 8. Anemia: Hgb7.7, s/p 1U PRBCs 8/15. FOBT pending. Aranesp 10074m(^ dose) given 8/16. 9. Secondary hyperparathyroidism:CorrCa/Phos ok. Continue Renvela 1600m24mD for now. 10. Nutrition: Alb very low - continue protein supplements.  KatiVeneta Penton-C 01/18/2020, 9:48 AM  [employer]

## 2020-01-18 NOTE — Progress Notes (Addendum)
Progress Note    [name].  ION:[id number] DOB: 1950/08/08  DOA: 01/04/2020 PCP: [facility]    Brief Narrative:    Medical records reviewed and are as summarized below:  Delon Barnaby Rippeon. is an 69 y.o. male with history of ESRD on hemodialysis on Monday Wednesday Friday, hypertension, diabetes mellitus type 2, anemia history of colon cancer status post resection has been having increasing headache back pain over the last week or so.  Found to have epidural abscess and is s/p laminectomy.  Required intubation due to AMS-- continues to have issues with short term memory and sundowning.  PT is currently recommending SNF.  Assessment/Plan:   Principal Problem:   MRSA bacteremia Active Problems:   TOBACCO ABUSE   OBSTRUCTIVE SLEEP APNEA   Colon cancer, status Post resection    HYPERTENSION   ESRD (end stage renal disease) ([place])   Epidural abscess   Acute encephalopathy   Sepsis ([place])   Bacteremia associated with intravascular line ([place])   Hemodialysis patient ([place])   Acute hypoxic respiratory failure with compromised airway secondary to acute encephalopathy  Patient extubated on 2/44  Acute metabolic toxic encephalopathy: short term memory seems to be impaired and I have concerns about his ability to make his own medical decisions currently CT Head and MRI brain negative for acute intracranial abnormalities; Suspect to be secondary to sepsis vs substance abuse and doubt uremia currently. He had HD sessions per MWF schedule with improvement in mental status. -seroquel Qhs for sundowning -see below regarding thiamine  MRSA bacteremia  Epidural abscess s/p laminectomy: - Continued on vancomycin and rifampin x 8 weeks total per ID  -Leukocytosis: continues to improve  ESRD on HD: - HD per nephrology -has temp in HD cath in right neck-- defer access issues to renal  Anemia of chronic disease: No signs of active bleeding.  Suspect secondary to acute  illness and anemia of chronic disease.  -aranesp per nephrology  -stool for occult blood -s/p 1 unit on 8/15  Type 2 DM with hyperglycemia - Levemir + SSI   Alcohol abuse/cocaine abuse -encourage cessation -thiamine high dose x 3 days    Family Communication/Anticipated D/C date and plan/Code Status   DVT prophylaxis: Lovenox ordered. Code Status: Full Code.  Disposition Plan: Status is: Inpatient Attempted to call family to determine prior baseline, no anser Remains inpatient appropriate because:IV treatments appropriate due to intensity of illness or inability to take PO   Dispo: The patient is from: Home              Anticipated d/c is to: Home vs SNF              Anticipated d/c date is: 2 days              Patient currently is not medically stable to d/c.         Medical Consultants:    PCCM  ID  renal   Subjective:    Refused PT yesterday but does not remember-- seems to be worse in the PM with confusion  Objective:    Vitals:   01/17/20 1247 01/17/20 2007 01/18/20 0335 01/18/20 0748  BP: (!) 163/61 (!) 154/69 (!) 149/72 (!) 151/66  Pulse: 91 85 89 87  Resp: '18 18 17 17  ' Temp: 98.6 F (37 C) 98.5 F (36.9 C) 98 F (36.7 C) 99.3 F (37.4 C)  TempSrc: Oral Oral Oral Oral  SpO2: 96% 97% 98% 94%  Weight:      Height:        Intake/Output Summary (Last 24 hours) at 01/18/2020 0830 Last data filed at 01/17/2020 2300 Gross per 24 hour  Intake 814.12 ml  Output 2100 ml  Net -1285.88 ml   Filed Weights   01/12/20 1105 01/13/20 0500 01/14/20 0338  Weight: 81.7 kg 91.2 kg 96.4 kg    Exam:   General: Appearance:     Overweight male in no acute distress     Lungs:     respirations unlabored  Heart:    Normal heart rate. Normal rhythm.    MS:   All extremities are intact.   Neurologic:   Awake, alert- poor insight into disease process     Data Reviewed:   I have personally reviewed following labs and imaging studies:  Labs: Labs  show the following:   Basic Metabolic Panel: Recent Labs  Lab 01/14/20 0330 01/14/20 0330 01/15/20 0410 01/15/20 0410 01/16/20 0338 01/16/20 0338 01/17/20 0349 01/18/20 0412  NA 131*  --  134*  --  132*  --  132* 134*  K 3.7   < > 4.2   < > 4.0   < > 4.5 3.7  CL 91*  --  97*  --  97*  --  96* 96*  CO2 25  --  25  --  24  --  21* 26  GLUCOSE 112*  --  77  --  151*  --  120* 168*  BUN 83*  --  38*  --  55*  --  65* 31*  CREATININE 8.35*  --  5.78*  --  8.40*  --  10.71* 6.75*  CALCIUM 7.7*  --  7.7*  --  7.5*  --  7.4* 7.5*  PHOS 6.8*  --  5.2*  --  6.2*  --  7.7* 4.9*   < > = values in this interval not displayed.   GFR Estimated Creatinine Clearance: 12.5 mL/min (A) (by C-G formula based on SCr of 6.75 mg/dL (H)). Liver Function Tests: Recent Labs  Lab 01/13/20 0413 01/13/20 0413 01/14/20 0330 01/15/20 0410 01/16/20 0338 01/17/20 0349 01/18/20 0412  AST 30  --   --   --   --   --   --   ALT 20  --   --   --   --   --   --   ALKPHOS 77  --   --   --   --   --   --   BILITOT 2.3*  --   --   --   --   --   --   PROT 6.5  --   --   --   --   --   --   ALBUMIN 1.8*   < > 1.9* 1.7* 1.7* 1.8* 1.7*   < > = values in this interval not displayed.   No results for input(s): LIPASE, AMYLASE in the last 168 hours. No results for input(s): AMMONIA in the last 168 hours. Coagulation profile No results for input(s): INR, PROTIME in the last 168 hours.  CBC: Recent Labs  Lab 01/13/20 0413 01/14/20 0330 01/15/20 0410 01/16/20 0338 01/17/20 0349  WBC 22.3* 27.0* 13.0* 11.1* 10.6*  HGB 7.5* 7.5* 7.2* 6.9* 7.7*  HCT 22.7* 22.6* 21.8* 21.1* 23.5*  MCV 81.4 79.9* 82.0 82.4 81.9  PLT 271 334 228 224 220   Cardiac Enzymes: No results for input(s): CKTOTAL, CKMB, CKMBINDEX, TROPONINI in  the last 168 hours. BNP (last 3 results) No results for input(s): PROBNP in the last 8760 hours. CBG: Recent Labs  Lab 01/17/20 1256 01/17/20 1610 01/17/20 2055 01/18/20 0332  01/18/20 0750  GLUCAP 92 137* 137* 135* 163*   D-Dimer: No results for input(s): DDIMER in the last 72 hours. Hgb A1c: No results for input(s): HGBA1C in the last 72 hours. Lipid Profile: No results for input(s): CHOL, HDL, LDLCALC, TRIG, CHOLHDL, LDLDIRECT in the last 72 hours. Thyroid function studies: No results for input(s): TSH, T4TOTAL, T3FREE, THYROIDAB in the last 72 hours.  Invalid input(s): FREET3 Anemia work up: No results for input(s): VITAMINB12, FOLATE, FERRITIN, TIBC, IRON, RETICCTPCT in the last 72 hours. Sepsis Labs: Recent Labs  Lab 01/14/20 0330 01/15/20 0410 01/16/20 0338 01/17/20 0349  WBC 27.0* 13.0* 11.1* 10.6*    Microbiology Recent Results (from the past 240 hour(s))  MRSA PCR Screening     Status: None   Collection Time: 01/10/20  2:20 PM   Specimen: Nasopharyngeal  Result Value Ref Range Status   MRSA by PCR NEGATIVE NEGATIVE Final    Comment:        The GeneXpert MRSA Assay (FDA approved for NASAL specimens only), is one component of a comprehensive MRSA colonization surveillance program. It is not intended to diagnose MRSA infection nor to guide or monitor treatment for MRSA infections. Performed at [facility], [place] [address] [place], [place] [address]     Procedures and diagnostic studies:  No results found.  Medications:   . (feeding supplement) PROSource Plus  30 mL Oral BID BM  . amLODipine  10 mg Oral Daily  . atorvastatin  20 mg Oral QHS  . chlorhexidine gluconate (MEDLINE KIT)  15 mL Mouth Rinse BID  . Chlorhexidine Gluconate Cloth  6 each Topical Q0600  . Chlorhexidine Gluconate Cloth  6 each Topical Q0600  . darbepoetin (ARANESP) injection - DIALYSIS  100 mcg Intravenous Q Mon-HD  . enoxaparin (LOVENOX) injection  30 mg Subcutaneous Q24H  . feeding supplement (NEPRO CARB STEADY)  237 mL Oral BID BM  . HYDROcodone-acetaminophen  1 tablet Oral Q6H  . insulin aspart  0-9 Units Subcutaneous Q4H  . insulin  detemir  11 Units Subcutaneous BID  . metoprolol tartrate  25 mg Oral BID  . multivitamin  1 tablet Oral QHS  . QUEtiapine  12.5 mg Oral QHS  . rifampin  300 mg Oral Q12H  . sevelamer carbonate  1,600 mg Oral TID WC  . sodium chloride flush  10-40 mL Intracatheter Q12H   Continuous Infusions: . sodium chloride 10 mL/hr at 01/13/20 1700  . thiamine injection 500 mg (01/17/20 1441)  . vancomycin 750 mg (01/17/20 1044)     LOS: 14 days   Geradine Girt  [facility]   How to contact the [facility] Attending or Consulting provider [place] or covering provider during after hours [place], for this patient?  1. Check the care team in [facility] and look for a) attending/consulting [facility] provider listed and b) the [facility] team listed 2. Log into [email or web address] and use [place]'s universal password to access. If you do not have the password, please contact the hospital operator. 3. Locate the [facility] provider you are looking for under [facility] and page to a number that you can be directly reached. 4. If you still have difficulty reaching the provider, please page the [facility] (Director on Call) for the Hospitalists listed on amion for assistance.  01/18/2020, 8:30 AM

## 2020-01-18 NOTE — NC FL2 (Signed)
[place] LEVEL OF CARE SCREENING TOOL     IDENTIFICATION  Patient Name: [name]. Birthdate: 19-Mar-1951 Sex: male Admission Date (Current Location): 01/04/2020  [facility] and [place] Number:  Herbalist and Address:  [facility] [place], [place] [address] [place], [place] [address]      Provider Number: [id number]  Attending Physician Name and Address:  Geradine Girt, DO  Relative Name and Phone Number:  Sabri Teal - daughter - [phone]    Current Level of Care: Hospital Recommended Level of Care: [place] Prior Approval Number:    Date Approved/Denied:   PASRR Number: [id number]  Discharge Plan: SNF    Current Diagnoses: Patient Active Problem List   Diagnosis Date Noted  . Hemodialysis patient ([place])   . Bacteremia associated with intravascular line ([place])   . Sepsis ([place]) 01/05/2020  . MRSA bacteremia 01/05/2020  . 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 Acute encephalopathy 01/04/2020  . Symptomatic anemia 02/11/2019  . Type 2 diabetes mellitus with renal complication ([place]) 75/64/3329  . ESRD (end stage renal disease) ([place])   . Cardiac murmur 01/08/2019  . Preoperative clearance 01/08/2019  . Uncontrolled Type 2 DM 09/15/2013  . HYPERTENSION 03/08/2013  . CKD  06/24/2012  . Colon cancer, status Post resection  05/11/2012  . Malnutrition of moderate degree ([place]) 02/24/2012  . Health care maintenance 11/27/2011  . Obesity (BMI 30.0-34.9) 10/20/2011  . Cataract, bilateral 08/29/2011  . Hyperlipidemia LDL goal <70 04/14/2006  . MICROCYTIC ANEMIA 04/14/2006  . TOBACCO ABUSE 04/14/2006  . OBSTRUCTIVE SLEEP APNEA 04/14/2006    Orientation RESPIRATION BLADDER Height & Weight     Self, Time, Situation  Normal Continent (Oliguria - hemodialysis MWF) Weight: 96.4 kg Height:  5' 11.5" (181.6 cm)  BEHAVIORAL SYMPTOMS/MOOD NEUROLOGICAL BOWEL NUTRITION STATUS      Continent Diet (See Discharge Summary)   AMBULATORY STATUS COMMUNICATION OF NEEDS Skin   Total Care Verbally Surgical wounds, Other (Comment) (staples to neck)                       Personal Care Assistance Level of Assistance  Bathing, Feeding, Dressing Bathing Assistance: Maximum assistance Feeding assistance: Limited assistance Dressing Assistance: Maximum assistance     Functional Limitations Info  Sight, Hearing, Speech Sight Info: Impaired Hearing Info: Adequate Speech Info: Adequate    SPECIAL CARE FACTORS FREQUENCY  PT (By licensed PT), OT (By licensed OT) (IV Vancomycin with Hemodialysis Treatments on MWF for 8 weeks)     PT Frequency: 5 x per week OT Frequency: 5 x per week            Contractures Contractures Info: Not present    Additional Factors Info  Code Status, Allergies, Psychotropic, Insulin Sliding Scale Code Status Info: Full Allergies Info: NKDA Psychotropic Info: Klonopin, Seroquel Insulin Sliding Scale Info: See Discharge Summary       Current Medications (01/18/2020):  This is the current hospital active medication list Current Facility-Administered Medications  Medication Dose Route Frequency Provider Last Rate Last Admin  . (feeding supplement) PROSource Plus liquid 30 mL  30 mL Oral BID BM Chesley Mires, MD   30 mL at 01/18/20 0906  . 0.9 %  sodium chloride infusion   Intravenous Continuous Belinda Block, MD 10 mL/hr at 01/13/20 1700 Rate Verify at 01/13/20 1700  . acetaminophen (TYLENOL) tablet 650 mg  650 mg Oral Q6H PRN Amedeo Plenty, [place]   650  mg at 01/14/20 0556  . amLODipine (NORVASC) tablet 10 mg  10 mg Oral Daily Eulogio Bear U, DO   10 mg at 01/18/20 0908  . atorvastatin (LIPITOR) tablet 20 mg  20 mg Oral QHS Chesley Mires, MD   20 mg at 01/15/20 2207  . chlorhexidine gluconate (MEDLINE KIT) (PERIDEX) 0.12 % solution 15 mL  15 mL Mouth Rinse BID Anders Simmonds, MD   15 mL at 01/18/20 0911  . Chlorhexidine Gluconate Cloth 2 % PADS 6 each  6 each Topical Q0600  Chesley Mires, MD   6 each at 01/18/20 0911  . Chlorhexidine Gluconate Cloth 2 % PADS 6 each  6 each Topical Q0600 Janalee Dane, PA-C   6 each at 01/17/20 0550  . clonazePAM (KLONOPIN) tablet 0.5 mg  0.5 mg Oral BID PRN Harvie Heck, MD   0.5 mg at 01/16/20 0352  . Darbepoetin Alfa (ARANESP) injection 100 mcg  100 mcg Intravenous Q Mon-HD Collins, Samantha G, PA-C   100 mcg at 01/17/20 1045  . enoxaparin (LOVENOX) injection 30 mg  30 mg Subcutaneous Q24H Olalere, Adewale A, MD   30 mg at 01/17/20 1622  . feeding supplement (NEPRO CARB STEADY) liquid 237 mL  237 mL Oral BID BM Chesley Mires, MD   237 mL at 01/18/20 0905  . hydrALAZINE (APRESOLINE) injection 10 mg  10 mg Intravenous Q4H PRN Rise Patience, MD   10 mg at 01/10/20 1432  . HYDROcodone-acetaminophen (NORCO/VICODIN) 5-325 MG per tablet 1 tablet  1 tablet Oral Q6H Harvie Heck, MD   1 tablet at 01/17/20 1629  . insulin aspart (novoLOG) injection 0-9 Units  0-9 Units Subcutaneous Q4H Harvie Heck, MD   2 Units at 01/18/20 0910  . insulin detemir (LEVEMIR) injection 11 Units  11 Units Subcutaneous BID Harvie Heck, MD   11 Units at 01/18/20 0910  . metoprolol tartrate (LOPRESSOR) tablet 25 mg  25 mg Oral BID Chesley Mires, MD   25 mg at 01/18/20 0908  . multivitamin (RENA-VIT) tablet 1 tablet  1 tablet Oral QHS Chesley Mires, MD   1 tablet at 01/15/20 2207  . ondansetron (ZOFRAN) injection 4 mg  4 mg Intravenous Q6H PRN Rise Patience, MD   4 mg at 01/13/20 1308  . polyethylene glycol (MIRALAX / GLYCOLAX) packet 17 g  17 g Oral Daily PRN Damita Lack, MD   17 g at 01/08/20 1217  . QUEtiapine (SEROQUEL) tablet 12.5 mg  12.5 mg Oral QHS Vann, Jessica U, DO      . rifampin (RIFADIN) capsule 300 mg  300 mg Oral Q12H [place] Callas, NP   300 mg at 01/18/20 0908  . senna-docusate (Senokot-S) tablet 2 tablet  2 tablet Oral QHS PRN Amin, Ankit Chirag, MD      . sevelamer carbonate (RENVELA) tablet 1,600 mg  1,600 mg Oral TID  WC Vann, Jessica U, DO   1,600 mg at 01/18/20 0908  . sodium chloride flush (NS) 0.9 % injection 10-40 mL  10-40 mL Intracatheter Q12H Omar Person, NP   10 mL at 01/16/20 0923  . sodium chloride flush (NS) 0.9 % injection 10-40 mL  10-40 mL Intracatheter PRN Omar Person, NP   10 mL at 01/18/20 0420  . thiamine 569m in normal saline (572m IVPB  500 mg Intravenous Daily VaEulogio Bear, DO 100 mL/hr at 01/18/20 0905 500 mg at 01/18/20 0905  . vancomycin (VANCOCIN) IVPB 750 mg/150 ml premix  750 mg Intravenous Q M,W,F-HD [place] Callas, NP 150 mL/hr at 01/17/20 1044 750 mg at 01/17/20 1044     Discharge Medications: Please see discharge summary for a list of discharge medications.  Relevant Imaging Results:  Relevant Lab Results:   Additional Information [id number]  Curlene Labrum, RN

## 2020-01-18 NOTE — TOC Progression Note (Signed)
Transition of Care (TOC) - Progression Note    Patient Details  Name: [name]. MRN: [id number] Date of Birth: [birth year]  Transition of Care [facility]) CM/SW [place], RN Phone Number: 01/18/2020, 3:41 PM  Clinical Narrative:    Case management called and spoke with the patient's daughter's Debroah Baller and [name] and gave them choice regarding SNf placement for the patient.  I will call Alazar Cherian back for choice regarding SNF placement.  The SNF facility will have to provide transportation to the HD facility on Monday, Wednesday, and Friday via the facility transport.  Patient has medicare A and B and will not need insurance authorization.  I will contact the renal navigator to coordinate HD times.   Expected Discharge Plan: Skilled Nursing Facility Barriers to Discharge: Continued Medical Work up  Expected Discharge Plan and Services Expected Discharge Plan: [place]   Discharge Planning Services: CM Consult Post Acute Care Choice: [place]                                         Social Determinants of Health (SDOH) Interventions    Readmission Risk Interventions Readmission Risk Prevention Plan 01/18/2020  Transportation Screening Complete  Medication Review Press photographer) Complete  PCP or Specialist appointment within 3-5 days of discharge Complete  HRI or Home Care Consult Complete  SW Recovery Care/Counseling Consult Complete  Palliative Care Screening Complete  Skilled Nursing Facility Complete  Some recent data might be hidden

## 2020-01-18 NOTE — NC FL2 (Signed)
[place] LEVEL OF CARE SCREENING TOOL     IDENTIFICATION  Patient Name: [name]. Birthdate: 19-Mar-1951 Sex: male Admission Date (Current Location): 01/04/2020  [facility] and [place] Number:  Herbalist and Address:  [facility] [place], [place] [address] [place], [place] [address]      Provider Number: [id number]  Attending Physician Name and Address:  Geradine Girt, DO  Relative Name and Phone Number:  Sabri Teal - daughter - [phone]    Current Level of Care: Hospital Recommended Level of Care: [place] Prior Approval Number:    Date Approved/Denied:   PASRR Number: [id number]  Discharge Plan: SNF    Current Diagnoses: Patient Active Problem List   Diagnosis Date Noted  . Hemodialysis patient ([place])   . Bacteremia associated with intravascular line ([place])   . Sepsis ([place]) 01/05/2020  . MRSA bacteremia 01/05/2020  . 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 Acute encephalopathy 01/04/2020  . Symptomatic anemia 02/11/2019  . Type 2 diabetes mellitus with renal complication ([place]) 75/64/3329  . ESRD (end stage renal disease) ([place])   . Cardiac murmur 01/08/2019  . Preoperative clearance 01/08/2019  . Uncontrolled Type 2 DM 09/15/2013  . HYPERTENSION 03/08/2013  . CKD  06/24/2012  . Colon cancer, status Post resection  05/11/2012  . Malnutrition of moderate degree ([place]) 02/24/2012  . Health care maintenance 11/27/2011  . Obesity (BMI 30.0-34.9) 10/20/2011  . Cataract, bilateral 08/29/2011  . Hyperlipidemia LDL goal <70 04/14/2006  . MICROCYTIC ANEMIA 04/14/2006  . TOBACCO ABUSE 04/14/2006  . OBSTRUCTIVE SLEEP APNEA 04/14/2006    Orientation RESPIRATION BLADDER Height & Weight     Self, Time, Situation  Normal Continent (Oliguria - hemodialysis MWF) Weight: 96.4 kg Height:  5' 11.5" (181.6 cm)  BEHAVIORAL SYMPTOMS/MOOD NEUROLOGICAL BOWEL NUTRITION STATUS      Continent Diet (See Discharge Summary)   AMBULATORY STATUS COMMUNICATION OF NEEDS Skin   Total Care Verbally Surgical wounds, Other (Comment) (staples to neck)                       Personal Care Assistance Level of Assistance  Bathing, Feeding, Dressing Bathing Assistance: Maximum assistance Feeding assistance: Limited assistance Dressing Assistance: Maximum assistance     Functional Limitations Info  Sight, Hearing, Speech Sight Info: Impaired Hearing Info: Adequate Speech Info: Adequate    SPECIAL CARE FACTORS FREQUENCY  PT (By licensed PT), OT (By licensed OT) (IV Vancomycin with Hemodialysis Treatments on MWF for 8 weeks)     PT Frequency: 5 x per week OT Frequency: 5 x per week            Contractures Contractures Info: Not present    Additional Factors Info  Code Status, Allergies, Psychotropic, Insulin Sliding Scale Code Status Info: Full Allergies Info: NKDA Psychotropic Info: Klonopin, Seroquel Insulin Sliding Scale Info: See Discharge Summary       Current Medications (01/18/2020):  This is the current hospital active medication list Current Facility-Administered Medications  Medication Dose Route Frequency Provider Last Rate Last Admin  . (feeding supplement) PROSource Plus liquid 30 mL  30 mL Oral BID BM Chesley Mires, MD   30 mL at 01/18/20 0906  . 0.9 %  sodium chloride infusion   Intravenous Continuous Belinda Block, MD 10 mL/hr at 01/13/20 1700 Rate Verify at 01/13/20 1700  . acetaminophen (TYLENOL) tablet 650 mg  650 mg Oral Q6H PRN Amedeo Plenty, [place]   650  mg at 01/14/20 0556  . amLODipine (NORVASC) tablet 10 mg  10 mg Oral Daily Eulogio Bear U, DO   10 mg at 01/18/20 0908  . atorvastatin (LIPITOR) tablet 20 mg  20 mg Oral QHS Chesley Mires, MD   20 mg at 01/15/20 2207  . chlorhexidine gluconate (MEDLINE KIT) (PERIDEX) 0.12 % solution 15 mL  15 mL Mouth Rinse BID Anders Simmonds, MD   15 mL at 01/18/20 0911  . Chlorhexidine Gluconate Cloth 2 % PADS 6 each  6 each Topical Q0600  Chesley Mires, MD   6 each at 01/18/20 0911  . Chlorhexidine Gluconate Cloth 2 % PADS 6 each  6 each Topical Q0600 Janalee Dane, PA-C   6 each at 01/17/20 0550  . clonazePAM (KLONOPIN) tablet 0.5 mg  0.5 mg Oral BID PRN Harvie Heck, MD   0.5 mg at 01/16/20 0352  . Darbepoetin Alfa (ARANESP) injection 100 mcg  100 mcg Intravenous Q Mon-HD Collins, Samantha G, PA-C   100 mcg at 01/17/20 1045  . enoxaparin (LOVENOX) injection 30 mg  30 mg Subcutaneous Q24H Olalere, Adewale A, MD   30 mg at 01/17/20 1622  . feeding supplement (NEPRO CARB STEADY) liquid 237 mL  237 mL Oral BID BM Chesley Mires, MD   237 mL at 01/18/20 0905  . hydrALAZINE (APRESOLINE) injection 10 mg  10 mg Intravenous Q4H PRN Rise Patience, MD   10 mg at 01/10/20 1432  . HYDROcodone-acetaminophen (NORCO/VICODIN) 5-325 MG per tablet 1 tablet  1 tablet Oral Q6H Harvie Heck, MD   1 tablet at 01/17/20 1629  . insulin aspart (novoLOG) injection 0-9 Units  0-9 Units Subcutaneous Q4H Harvie Heck, MD   2 Units at 01/18/20 0910  . insulin detemir (LEVEMIR) injection 11 Units  11 Units Subcutaneous BID Harvie Heck, MD   11 Units at 01/18/20 0910  . metoprolol tartrate (LOPRESSOR) tablet 25 mg  25 mg Oral BID Chesley Mires, MD   25 mg at 01/18/20 0908  . multivitamin (RENA-VIT) tablet 1 tablet  1 tablet Oral QHS Chesley Mires, MD   1 tablet at 01/15/20 2207  . ondansetron (ZOFRAN) injection 4 mg  4 mg Intravenous Q6H PRN Rise Patience, MD   4 mg at 01/13/20 1308  . polyethylene glycol (MIRALAX / GLYCOLAX) packet 17 g  17 g Oral Daily PRN Damita Lack, MD   17 g at 01/08/20 1217  . QUEtiapine (SEROQUEL) tablet 12.5 mg  12.5 mg Oral QHS Vann, Jessica U, DO      . rifampin (RIFADIN) capsule 300 mg  300 mg Oral Q12H [place] Callas, NP   300 mg at 01/18/20 0908  . senna-docusate (Senokot-S) tablet 2 tablet  2 tablet Oral QHS PRN Amin, Ankit Chirag, MD      . sevelamer carbonate (RENVELA) tablet 1,600 mg  1,600 mg Oral TID  WC Vann, Jessica U, DO   1,600 mg at 01/18/20 0908  . sodium chloride flush (NS) 0.9 % injection 10-40 mL  10-40 mL Intracatheter Q12H Omar Person, NP   10 mL at 01/16/20 0923  . sodium chloride flush (NS) 0.9 % injection 10-40 mL  10-40 mL Intracatheter PRN Omar Person, NP   10 mL at 01/18/20 0420  . thiamine 569m in normal saline (572m IVPB  500 mg Intravenous Daily VaEulogio Bear, DO 100 mL/hr at 01/18/20 0905 500 mg at 01/18/20 0905  . vancomycin (VANCOCIN) IVPB 750 mg/150 ml premix  750 mg Intravenous Q M,W,F-HD [place] Callas, NP 150 mL/hr at 01/17/20 1044 750 mg at 01/17/20 1044     Discharge Medications: Please see discharge summary for a list of discharge medications.  Relevant Imaging Results:  Relevant Lab Results:   Additional Information Hemodialysis MWF with Vancomycin infusions during treatment for next 8 weeks.  Curlene Labrum, RN

## 2020-01-19 LAB — RENAL FUNCTION PANEL
Albumin: 1.7 g/dL — ABNORMAL LOW (ref 3.5–5.0)
Anion gap: 11 (ref 5–15)
BUN: 42 mg/dL — ABNORMAL HIGH (ref 8–23)
CO2: 26 mmol/L (ref 22–32)
Calcium: 7.4 mg/dL — ABNORMAL LOW (ref 8.9–10.3)
Chloride: 95 mmol/L — ABNORMAL LOW (ref 98–111)
Creatinine, Ser: 9.05 mg/dL — ABNORMAL HIGH (ref 0.61–1.24)
GFR calc Af Amer: 6 mL/min — ABNORMAL LOW (ref >60)
GFR calc non Af Amer: 5 mL/min — ABNORMAL LOW (ref >60)
Glucose, Bld: 93 mg/dL (ref 70–99)
Phosphorus: 6.3 mg/dL — ABNORMAL HIGH (ref 2.5–4.6)
Potassium: 4 mmol/L (ref 3.5–5.1)
Sodium: 132 mmol/L — ABNORMAL LOW (ref 135–145)

## 2020-01-19 LAB — CBC
HCT: 23.8 % — ABNORMAL LOW (ref 39.0–52.0)
Hemoglobin: 7.9 g/dL — ABNORMAL LOW (ref 13.0–17.0)
MCH: 27.4 pg (ref 26.0–34.0)
MCHC: 33.2 g/dL (ref 30.0–36.0)
MCV: 82.6 fL (ref 80.0–100.0)
Platelets: 204 10*3/uL (ref 150–400)
RBC: 2.88 MIL/uL — ABNORMAL LOW (ref 4.22–5.81)
RDW: 16.8 % — ABNORMAL HIGH (ref 11.5–15.5)
WBC: 8.9 10*3/uL (ref 4.0–10.5)
nRBC: 0 % (ref 0.0–0.2)

## 2020-01-19 LAB — GLUCOSE, CAPILLARY
Glucose-Capillary: 133 mg/dL — ABNORMAL HIGH (ref 70–99)
Glucose-Capillary: 116 mg/dL — ABNORMAL HIGH (ref 70–99)
Glucose-Capillary: 94 mg/dL (ref 70–99)

## 2020-01-19 MED ORDER — HEPARIN SODIUM (PORCINE) 1000 UNIT/ML IJ SOLN
INTRAMUSCULAR | Status: AC
  Filled 2020-01-19: qty 4

## 2020-01-19 MED ORDER — VANCOMYCIN HCL IN DEXTROSE 750-5 MG/150ML-% IV SOLN
INTRAVENOUS | Status: AC
  Administered 2020-01-19: 750 mg via INTRAVENOUS
  Filled 2020-01-19: qty 150

## 2020-01-19 NOTE — Progress Notes (Signed)
Cannulation issues again with AVF today. ICU placed temp HD cath last week after infiltration. Still has temp HD line which had to be used for dialysis today. He is getting closer to discharge. Will need tunneled HD line prior to discharge. Will consult IR to exchange temp line for tunneled line and possibly fistulogram.   Veneta Penton, PA-C [facility] Pager [phone]

## 2020-01-19 NOTE — Progress Notes (Signed)
Pt. AVF remains somewhat swollen with hematoma noted at proximal site. Able to successgully cannulate AVF x1 needle. Tx initiated with right IG temp cath arterial access and venous access to Left AVF. Dr. Joylene Grapes aware.

## 2020-01-19 NOTE — Progress Notes (Signed)
[facility] Progress Note   Subjective:  Seen on hd, difficulty accessing avf. Using venous on avf, arterial on temp line  Objective Vitals:   01/19/20 0830 01/19/20 0900 01/19/20 0930 01/19/20 1000  BP: (!) 156/69 (!) 162/75 (!) 149/70 (!) 163/74  Pulse: 75 76 77 83  Resp:      Temp:      TempSrc:      SpO2:      Weight:      Height:       Physical Exam General: Well appearing man, NAD. Room air. Heart: RRR; no murmur Lungs: CTA anteriorly Abdomen: soft, non-tender Extremities: no BLE edema Dialysis Access: L AVF + bruit, some edema. R IJ temp cath  Additional Objective Labs: Basic Metabolic Panel: Recent Labs  Lab 01/17/20 0349 01/18/20 0412 01/19/20 0332  NA 132* 134* 132*  K 4.5 3.7 4.0  CL 96* 96* 95*  CO2 21* 26 26  GLUCOSE 120* 168* 93  BUN 65* 31* 42*  CREATININE 10.71* 6.75* 9.05*  CALCIUM 7.4* 7.5* 7.4*  PHOS 7.7* 4.9* 6.3*   Liver Function Tests: Recent Labs  Lab 01/13/20 0413 01/14/20 0330 01/17/20 0349 01/18/20 0412 01/19/20 0332  AST 30  --   --   --   --   ALT 20  --   --   --   --   ALKPHOS 77  --   --   --   --   BILITOT 2.3*  --   --   --   --   PROT 6.5  --   --   --   --   ALBUMIN 1.8*   < > 1.8* 1.7* 1.7*   < > = values in this interval not displayed.   CBC: Recent Labs  Lab 01/14/20 0330 01/14/20 0330 01/15/20 0410 01/15/20 0410 01/16/20 0338 01/17/20 0349 01/19/20 0332  WBC 27.0*   < > 13.0*   < > 11.1* 10.6* 8.9  HGB 7.5*   < > 7.2*   < > 6.9* 7.7* 7.9*  HCT 22.6*   < > 21.8*   < > 21.1* 23.5* 23.8*  MCV 79.9*  --  82.0  --  82.4 81.9 82.6  PLT 334   < > 228   < > 224 220 204   < > = values in this interval not displayed.   Blood Culture    Component Value Date/Time   SDES BLOOD RIGHT HAND 01/07/2020 1926   SPECREQUEST  01/07/2020 1926    BOTTLES DRAWN AEROBIC AND ANAEROBIC Blood Culture adequate volume   CULT  01/07/2020 1926    NO GROWTH 5 DAYS Performed at [facility], [place]  [address], [place], [place] [address]    REPTSTATUS 01/12/2020 FINAL 01/07/2020 1926   Medications: . sodium chloride 10 mL/hr at 01/13/20 1700  . thiamine injection 500 mg (01/18/20 0905)  . Vancomycin    . vancomycin 750 mg (01/17/20 1044)   . (feeding supplement) PROSource Plus  30 mL Oral BID BM  . amLODipine  10 mg Oral Daily  . atorvastatin  20 mg Oral QHS  . chlorhexidine gluconate (MEDLINE KIT)  15 mL Mouth Rinse BID  . Chlorhexidine Gluconate Cloth  6 each Topical Q0600  . darbepoetin (ARANESP) injection - DIALYSIS  100 mcg Intravenous Q Mon-HD  . enoxaparin (LOVENOX) injection  30 mg Subcutaneous Q24H  . feeding supplement (NEPRO CARB STEADY)  237 mL Oral BID BM  . HYDROcodone-acetaminophen  1 tablet Oral Q6H  . insulin aspart  0-9 Units Subcutaneous Q4H  . insulin detemir  11 Units Subcutaneous BID  . lidocaine  1 patch Transdermal Q24H  . metoprolol tartrate  25 mg Oral BID  . multivitamin  1 tablet Oral QHS  . QUEtiapine  12.5 mg Oral QHS  . rifampin  300 mg Oral Q12H  . sevelamer carbonate  1,600 mg Oral TID WC  . sodium chloride flush  10-40 mL Intracatheter Q12H   Dialysis Orders: [facility] MWF 4h 11mn 99kg 2/2 bath LUA AVF Hep none - Calcitriol 1.266m PO q HD - Mircera 150 mcg IV q 2 weeks (last given on 12/29/19)  Assessment/Plan: 1. 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  2. MRSA bacteremia:Repeat BCx 8/6 negative. TTE 8/6 without vegetation. TDC removed 8/6, using LUE AVF until infiltrated 8/9, was using temp HD cath. On IV vanc and PO Rifampin per ID.  3. Resp failure: S/p intubation/vent8/9 -8/12. Now off vent - on room air. 4. Confusion: Likely uremic, as resolved w/ dialysis. Still with some memory issues - ?chronic. 5. ESRD: Continue HD on MWF schedule - next HD 8/20 6. Infiltrated AVF: Difficult cannulation  on 8/16 however able to get it, however today there is a hard hematoma, was able to venous access on avf but had  To use catheter for arterial access. Maintain temp line. 7. HTN/volume: BP higher recently with trace edema - UF as tolerated. 8. Anemia: Hgb7.7, s/p 1U PRBCs 8/15. FOBT pending. Aranesp 10069m(^ dose) given 8/16. 9. Secondary hyperparathyroidism:CorrCa/Phos ok. Continue Renvela 1600m34mD for now. 10. Nutrition: Alb very low - continue protein supplements.  VikaGean Quint CaroAvera Flandreau Hospital8/2021, 10:23 AM

## 2020-01-19 NOTE — Progress Notes (Signed)
PROGRESS NOTE    [name].  TKP:[id number] DOB: 1950/08/20 DOA: 01/04/2020 PCP: [facility]    Brief Narrative:  Rodney Walling. is an 69 y.o. male  veteran withhistory of ESRD on hemodialysis on Monday Wednesday Friday, hypertension, diabetes mellitus type 2, anemia history of colon cancer status post resection has been having increasing headache back pain and found to have epidural abscess and is s/p laminectomy and MRSA septicemia.  Required intubation due to AMS-- continues to have issues with short term memory and sundowning.   Assessment & Plan:   Principal Problem:   MRSA bacteremia Active Problems:   TOBACCO ABUSE   OBSTRUCTIVE SLEEP APNEA   Colon cancer, status Post resection    HYPERTENSION   ESRD (end stage renal disease) ([place])   Epidural abscess   Acute encephalopathy   Sepsis ([place])   Bacteremia associated with intravascular line ([place])   Hemodialysis patient ([place])   Acute hypoxic respiratory failure with compromised airwaysecondary to acute encephalopathy Patient was briefly intubated requiring ventilatory support on 01/11/2020 and successfully expired on 01/13/2020.  Oxygenating well on room air.  Acute metabolic toxic encephalopathy: short term memory seems to be impaired and I have concerns about his ability to make his own medical decisions currently CT Head and MRI brain negative for acute intracranial abnormalities; Suspect to be secondary to sepsis vs substance abuse and doubt uremia currently.He had HD sessions per MWF schedule with improvement in mental status. --Continue seroquel Qhs for sundowning --Continue high-dose thiamine  MRSA bacteremia  Epidural abscess s/p laminectomy and fusion: --WBC 25>>>8.9 --Continued on vancomycin q M/W/F w/ HD and rifampin 311m PO BID x 8 weeks total per ID   ESRD on HD: Patient continues to have issues regarding his AV fistula requiring use of a temporary right IJ catheter placed by PCCM on  01/10/2020. --IR consulted for fistulogram and potential need for tunneled dialysis catheter; plan to proceed with procedure tomorrow --Continue HD per nephrology  Anemia of chronic disease: No signs of active bleeding.Suspect secondary to acute illness and anemia of chronic disease.  Received 1 unit PRBC on 01/16/2020. --aranesp per nephrology  Type 2 DM with hyperglycemia Hemoglobin A1c 6.5 on 01/12/2020, well controlled --Levemir 11u [place] daily --Continue insulin sliding scale for further coverage --CBGs before every meal/at bedtime  Alcohol abuse/cocaine abuse --encourage cessation --thiamine high dose x 3 days   Essential hypertension Continue metoprolol tartrate 25 mg p.o. twice daily loaded pain 10 mg p.o. daily  Hyperlipidemia: Continue atorvastatin 20 g p.o. daily   DVT prophylaxis: Lovenox ordered, but refuses intermittently Code Status: Full code Family Communication: No family present at bedside  Disposition Plan:  Status is: Inpatient  Remains inpatient appropriate because:Altered mental status, Unsafe d/c plan and IV treatments appropriate due to intensity of illness or inability to take PO   Dispo: The patient is from: Home              Anticipated d/c is to: SNF              Anticipated d/c date is: 3 days              Patient currently is not medically stable to d/c.   Consultants:   PCCM  Nephrology  Infectious disease  Procedures:   Intubation 8/10, extubated 01/13/2020  Right IJ nontunneled HD catheter, 8/9  Antimicrobials:   Rifampin  Vancomycin   Subjective: Seen and examined bedside following return from HD today.  Nephrology continues  to have issues regarding HD with AV fistula and needed to utilize temporary right IJ HD catheter once again today.  Plan on IR fistulogram with possible tunneled HD catheter placement tomorrow.  Patient refused some of his antibiotics and Lovenox today.  Wants to know why staff is concerned about his  family.  No other questions or concerns at this time other than requesting blanket.  Denies headache, no visual changes, no chest pain, no palpitations, no shortness of breath, no abdominal pain.  No acute events overnight per nursing staff.  Objective: Vitals:   01/19/20 1130 01/19/20 1200 01/19/20 1215 01/19/20 1320  BP: (!) 153/74 (!) 164/72 (!) 150/69 (!) 149/68  Pulse: 82 82 93 80  Resp:   16 15  Temp:   99 F (37.2 C) 98.9 F (37.2 C)  TempSrc:   Oral Oral  SpO2:   96% 95%  Weight:   88.1 kg   Height:        Intake/Output Summary (Last 24 hours) at 01/19/2020 1839 Last data filed at 01/19/2020 1752 Gross per 24 hour  Intake 370 ml  Output 3341 ml  Net -2971 ml   Filed Weights   01/14/20 0338 01/19/20 0804 01/19/20 1215  Weight: 96.4 kg 91 kg 88.1 kg    Examination:  General exam: Appears calm and comfortable  Respiratory system: Clear to auscultation. Respiratory effort normal. Cardiovascular system: S1 & S2 heard, RRR. No JVD, murmurs, rubs, gallops or clicks. No pedal edema.  Right IJ HD catheter noted Gastrointestinal system: Abdomen is nondistended, soft and nontender. No organomegaly or masses felt. Normal bowel sounds heard. Central nervous system: Alert and oriented. No focal neurological deficits. Extremities: Symmetric 5 x 5 power. Skin: No rashes, lesions or ulcers Psychiatry: Judgement and insight appear poor. Mood & affect appropriate.     Data Reviewed: I have personally reviewed following labs and imaging studies  CBC: Recent Labs  Lab 01/14/20 0330 01/15/20 0410 01/16/20 0338 01/17/20 0349 01/19/20 0332  WBC 27.0* 13.0* 11.1* 10.6* 8.9  HGB 7.5* 7.2* 6.9* 7.7* 7.9*  HCT 22.6* 21.8* 21.1* 23.5* 23.8*  MCV 79.9* 82.0 82.4 81.9 82.6  PLT 334 228 224 220 863   Basic Metabolic Panel: Recent Labs  Lab 01/15/20 0410 01/16/20 0338 01/17/20 0349 01/18/20 0412 01/19/20 0332  NA 134* 132* 132* 134* 132*  K 4.2 4.0 4.5 3.7 4.0  CL 97* 97* 96*  96* 95*  CO2 25 24 21* 26 26  GLUCOSE 77 151* 120* 168* 93  BUN 38* 55* 65* 31* 42*  CREATININE 5.78* 8.40* 10.71* 6.75* 9.05*  CALCIUM 7.7* 7.5* 7.4* 7.5* 7.4*  PHOS 5.2* 6.2* 7.7* 4.9* 6.3*   GFR: Estimated Creatinine Clearance: 8.5 mL/min (A) (by C-G formula based on SCr of 9.05 mg/dL (H)). Liver Function Tests: Recent Labs  Lab 01/13/20 0413 01/14/20 0330 01/15/20 0410 01/16/20 0338 01/17/20 0349 01/18/20 0412 01/19/20 0332  AST 30  --   --   --   --   --   --   ALT 20  --   --   --   --   --   --   ALKPHOS 77  --   --   --   --   --   --   BILITOT 2.3*  --   --   --   --   --   --   PROT 6.5  --   --   --   --   --   --  ALBUMIN 1.8*   < > 1.7* 1.7* 1.8* 1.7* 1.7*   < > = values in this interval not displayed.   No results for input(s): LIPASE, AMYLASE in the last 168 hours. No results for input(s): AMMONIA in the last 168 hours. Coagulation Profile: No results for input(s): INR, PROTIME in the last 168 hours. Cardiac Enzymes: No results for input(s): CKTOTAL, CKMB, CKMBINDEX, TROPONINI in the last 168 hours. BNP (last 3 results) No results for input(s): PROBNP in the last 8760 hours. HbA1C: No results for input(s): HGBA1C in the last 72 hours. CBG: Recent Labs  Lab 01/18/20 1621 01/18/20 2008 01/19/20 0746 01/19/20 1302 01/19/20 1613  GLUCAP 112* 102* 133* 116* 94   Lipid Profile: No results for input(s): CHOL, HDL, LDLCALC, TRIG, CHOLHDL, LDLDIRECT in the last 72 hours. Thyroid Function Tests: No results for input(s): TSH, T4TOTAL, FREET4, T3FREE, THYROIDAB in the last 72 hours. Anemia Panel: No results for input(s): VITAMINB12, FOLATE, FERRITIN, TIBC, IRON, RETICCTPCT in the last 72 hours. Sepsis Labs: No results for input(s): PROCALCITON, LATICACIDVEN in the last 168 hours.  Recent Results (from the past 240 hour(s))  MRSA PCR Screening     Status: None   Collection Time: 01/10/20  2:20 PM   Specimen: Nasopharyngeal  Result Value Ref Range Status    MRSA by PCR NEGATIVE NEGATIVE Final    Comment:        The GeneXpert MRSA Assay (FDA approved for NASAL specimens only), is one component of a comprehensive MRSA colonization surveillance program. It is not intended to diagnose MRSA infection nor to guide or monitor treatment for MRSA infections. Performed at [facility], [place] [address], [place], [place] [address]          Radiology Studies: No results found.      Scheduled Meds: . (feeding supplement) PROSource Plus  30 mL Oral BID BM  . amLODipine  10 mg Oral Daily  . atorvastatin  20 mg Oral QHS  . chlorhexidine gluconate (MEDLINE KIT)  15 mL Mouth Rinse BID  . Chlorhexidine Gluconate Cloth  6 each Topical Q0600  . darbepoetin (ARANESP) injection - DIALYSIS  100 mcg Intravenous Q Mon-HD  . enoxaparin (LOVENOX) injection  30 mg Subcutaneous Q24H  . feeding supplement (NEPRO CARB STEADY)  237 mL Oral BID BM  . heparin sodium (porcine)      . HYDROcodone-acetaminophen  1 tablet Oral Q6H  . insulin aspart  0-9 Units Subcutaneous Q4H  . insulin detemir  11 Units Subcutaneous BID  . lidocaine  1 patch Transdermal Q24H  . metoprolol tartrate  25 mg Oral BID  . multivitamin  1 tablet Oral QHS  . QUEtiapine  12.5 mg Oral QHS  . rifampin  300 mg Oral Q12H  . sevelamer carbonate  1,600 mg Oral TID WC  . sodium chloride flush  10-40 mL Intracatheter Q12H   Continuous Infusions: . sodium chloride 10 mL/hr at 01/13/20 1700  . vancomycin Stopped (01/19/20 1432)     LOS: 15 days    Time spent: 41 minutes spent on chart review, discussion with nursing staff, consultants, updating family and interview/physical exam; more than 50% of that time was spent in counseling and/or coordination of care.    Rodney Winterrowd J [place] DO [facility] Available via Epic secure chat 7am-7pm After these hours, please refer to coverage provider listed on [email or web address] 01/19/2020, 6:39 PM

## 2020-01-19 NOTE — Plan of Care (Signed)
  Problem: Education: Goal: Knowledge of General Education information will improve Description: Including pain rating scale, medication(s)/side effects and non-pharmacologic comfort measures Outcome: Progressing   Problem: Health Behavior/Discharge Planning: Goal: Ability to manage health-related needs will improve Outcome: Progressing   Problem: Activity: Goal: Risk for activity intolerance will decrease Outcome: Progressing   Problem: Pain Managment: Goal: General experience of comfort will improve Outcome: Progressing   Problem: Safety: Goal: Ability to remain free from injury will improve Outcome: Progressing   Problem: Skin Integrity: Goal: Risk for impaired skin integrity will decrease Outcome: Progressing   Problem: Education: Goal: Knowledge of General Education information will improve Description: Including pain rating scale, medication(s)/side effects and non-pharmacologic comfort measures Outcome: Progressing   Problem: Health Behavior/Discharge Planning: Goal: Ability to manage health-related needs will improve Outcome: Progressing   Problem: Activity: Goal: Risk for activity intolerance will decrease Outcome: Progressing   Problem: Pain Managment: Goal: General experience of comfort will improve Outcome: Progressing   Problem: Safety: Goal: Ability to remain free from injury will improve Outcome: Progressing   Problem: Skin Integrity: Goal: Risk for impaired skin integrity will decrease Outcome: Progressing

## 2020-01-19 NOTE — Consult Note (Signed)
Chief Complaint: Patient was seen in consultation today for ESRD on HD/fistulagram with possible intervention.  Referring Physician(s): Loren Racer  Supervising Physician: Daryll Brod  Patient Status: [facility] - In-pt  History of Present Illness: [name]. is a 69 y.o. male with a past medical history of hypertension, hyperlipidemia, GERD, colon cancer, ESRD on HD, diabetes mellitus type II, anemia, arthritis, and OSA. He has been admitted to [facility] since 01/04/2020 for management of an epidural abscess. He underwent C4-T2 laminectomy and fusion in OR 01/06/2020 by Dr. Kathyrn Sheriff. Nephrology was consulted on admission due to history of ESRD on HD. Upon admission, patient had a LUE AVF and right IJ tunneled HD catheter. His tunneled HD catheter was removed in IR 01/07/2020, as his AVF was functioning. He typically undergoes dialysis on Mondays, Wednesdays, and Thursdays. Of note, patient did have a temp HD catheter placed bedside by CCM 01/10/2020 due to infiltration. Attempts to access AVF today for dialysis unsuccessful secondary to difficulties with cannulation of AVF- he completed dialysis today via right IJ temp HD catheter.  IR requested by Stephania Fragmin, PA-C for possible image-guided fistulagram with possible intervention (with possible tunneled HD catheter placement). Patient awake and alert laying in bed. States he is tired today. Denies fever, chills, chest pain, dyspnea, abdominal pain, or headache.   Past Medical History:  Diagnosis Date  . Anemia   . Arthritis    left knee  . Chronic kidney disease    acute renal failure/injury requiring short-term HD 2013  . Colon cancer ([place])   . Colon polyp    11/2011 - Polyps identified, biopsy - invasive adenocarinoma  . DM II (diabetes mellitus, type II), controlled ([place])    type 2 IDDM x 15 years. A1C 1/09 13.7.   Marland Kitchen 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 12/25/2011  . GERD (gastroesophageal reflux disease)   . Heart  murmur   . 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 Hyperlipidemia   . Hypertension    for a few years and resolved in 2013/2014.  . Sleep apnea    does not wear CPAP now  . Wears glasses     Past Surgical History:  Procedure Laterality Date  . APPLICATION OF WOUND VAC  02/16/2012   Procedure: APPLICATION OF WOUND VAC;  Surgeon: Zenovia Jarred, MD;  Location: [place];  Service: General;  Laterality: N/A;  . APPLICATION OF WOUND VAC  02/26/2012   Procedure: APPLICATION OF WOUND VAC;  Surgeon: Imogene Burn. [name], MD;  Location: [place];  Service: General;  Laterality: N/A;  . AV FISTULA PLACEMENT Left 07/29/2019   Procedure: ARTERIOVENOUS (AV) FISTULA CREATION;  Surgeon: Marty Heck, MD;  Location: [place];  Service: Vascular;  Laterality: Left;  . COLONOSCOPY    . COLONOSCOPY  02/06/2012   Procedure: COLONOSCOPY;  Surgeon: Milus Banister, MD;  Location: [place];  Service: Endoscopy;;  . COLONOSCOPY WITH PROPOFOL N/A 06/10/2019   Procedure: COLONOSCOPY WITH PROPOFOL;  Surgeon: Milus Banister, MD;  Location: [facility];  Service: Endoscopy;  Laterality: N/A;  . COLOSTOMY REVERSAL    . DRESSING CHANGE UNDER ANESTHESIA  02/18/2012   Procedure: DRESSING CHANGE UNDER ANESTHESIA;  Surgeon: Odis Hollingshead, MD;  Location: [place];  Service: General;  Laterality: N/A;  . ESOPHAGOGASTRODUODENOSCOPY (EGD) WITH PROPOFOL N/A 06/10/2019   Procedure: ESOPHAGOGASTRODUODENOSCOPY (EGD) WITH PROPOFOL;  Surgeon: Milus Banister, MD;  Location: [facility];  Service: Endoscopy;  Laterality: N/A;  . GANGLION CYST EXCISION  [place]  . ILEOSTOMY  02/16/2012   Procedure: ILEOSTOMY;  Surgeon: Zenovia Jarred, MD;  Location: [place];  Service: General;  Laterality: N/A;  . ILEOSTOMY CLOSURE N/A 09/02/2012   Procedure: ILEOSTOMY REVERSAL;  Surgeon: Imogene Burn. [name], MD;  Location: [place];  Service: General;  Laterality: N/A;  . ILIOSTOMY    . INCISION AND DRAINAGE OF WOUND  02/23/2012   Procedure: IRRIGATION AND  DEBRIDEMENT WOUND;  Surgeon: Joyice Faster. Cornett, MD;  Location: [place];  Service: General;  Laterality: N/A;  . IR REMOVAL TUN CV CATH W/O FL  01/07/2020  . LAPAROTOMY  02/16/2012   Procedure: EXPLORATORY LAPAROTOMY;  Surgeon: Zenovia Jarred, MD;  Location: [place];  Service: General;  Laterality: N/A;  Exploratory Laparotomy with resection of anastomosis  . LAPAROTOMY  02/18/2012   Procedure: EXPLORATORY LAPAROTOMY;  Surgeon: Odis Hollingshead, MD;  Location: [place];  Service: General;  Laterality: N/A;  exploratory laparotomy  change of abdominal vac dressing  . LAPAROTOMY  02/20/2012   Procedure: EXPLORATORY LAPAROTOMY;  Surgeon: Odis Hollingshead, MD;  Location: [place];  Service: General;  Laterality: N/A;  . LAPAROTOMY  02/23/2012   Procedure: EXPLORATORY LAPAROTOMY;  Surgeon: Joyice Faster. Cornett, MD;  Location: [place];  Service: General;  Laterality: N/A;  Irrigation and Debridement of abdominal wound with wound vac change with partial closure  . LAPAROTOMY  02/26/2012   Procedure: EXPLORATORY LAPAROTOMY;  Surgeon: Imogene Burn. [name], MD;  Location: [place];  Service: General;  Laterality: N/A;     . PARTIAL COLECTOMY  02/06/2012  . PARTIAL COLECTOMY  02/06/2012   Procedure: PARTIAL COLECTOMY;  Surgeon: Imogene Burn. [name], MD;  Location: [place];  Service: General;  Laterality: N/A;  right partial colectomy  . POSTERIOR CERVICAL FUSION/FORAMINOTOMY N/A 01/06/2020   Procedure: CERVICAL FOUR- THORACIC TWO LAMINECTOMY AND FUSION;  Surgeon: Consuella Lose, MD;  Location: [place];  Service: Neurosurgery;  Laterality: N/A;  CERVICAL FOUR- THORACIC TWO LAMINECTOMY AND FUSION  . RESECTION SMALL BOWEL / CLOSURE ILEOSTOMY  402/2014   Dr [name]  . VACUUM ASSISTED CLOSURE CHANGE  02/20/2012   Procedure: ABDOMINAL VACUUM ASSISTED CLOSURE CHANGE;  Surgeon: Odis Hollingshead, MD;  Location: [place];  Service: General;;  . VACUUM ASSISTED CLOSURE CHANGE  02/23/2012   Procedure: ABDOMINAL VACUUM ASSISTED CLOSURE CHANGE;  Surgeon:  Joyice Faster. Cornett, MD;  Location: [place];  Service: General;  Laterality: N/A;    Allergies: Patient has no known allergies.  Medications: Prior to Admission medications   Medication Sig Start Date End Date Taking? Authorizing Provider  amLODipine (NORVASC) 10 MG tablet Take 10 mg by mouth at bedtime.    Yes [provider]  atorvastatin (LIPITOR) 20 MG tablet Take 20 mg by mouth at bedtime.    Yes [provider]  betamethasone dipropionate 0.05 % cream Apply 1 application topically 2 (two) times daily as needed (skin irritation; psoriasis).   Yes [provider]  insulin detemir (LEVEMIR) 100 UNIT/ML injection Inject 0.24 mLs (24 Units total) into the skin at bedtime. Patient taking differently: Inject 26 Units into the skin See admin instructions. Per sliding scale: Most is 22 units and least is 12 units. 12/08/13  Yes Boggala, Gaynelle Adu, MD  lidocaine-prilocaine (EMLA) cream Apply 1 application topically once. Apply to needle site 1 hr. Prior to cannulation for hemodialysis.   Yes [provider]  metoprolol tartrate (LOPRESSOR) 50 MG tablet Take 50 mg  by mouth 2 (two) times daily.    Yes [provider]  multivitamin (RENA-VIT) TABS tablet Take 1 tablet by mouth daily.   Yes [provider]  carbamazepine (TEGRETOL) 200 MG tablet Take 1 tablet (200 mg total) by mouth daily. Patient not taking: Reported on 01/04/2020 12/31/19 01/30/20  Rodell Perna A, PA-C  HYDROcodone-acetaminophen (NORCO/VICODIN) 5-325 MG tablet Take 1 tablet by mouth every 4 (four) hours as needed for moderate pain. Patient not taking: Reported on 12/31/2019 07/29/19 07/28/20  Barbie Banner, PA-C     Family History  Problem Relation Age of Onset  . Diabetes Father   . Heart disease Father   . Hypertension Mother   . Diabetes Sister   . Heart disease Sister   . Colon cancer Neg Hx     Social History   Socioeconomic History  . Marital status: Divorced     Spouse name: Not on file  . Number of children: 3  . Years of education: Not on file  . Highest education level: Not on file  Occupational History  . Occupation: retired/disabled    Employer: [employer]    Comment: Worked at [employer]  . Smoking status: Current Every Day Smoker    Packs/day: 0.50    Years: 30.00    Pack years: 15.00    Types: Cigarettes  . Smokeless tobacco: Never Used  Substance and Sexual Activity  . Alcohol use: Yes    Comment: Liquor twice monthly.  . 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 Sexual activity: Not on file  Other Topics Concern  . Not on file  Social History Narrative   Financial assistance approved for 100% discount at [facility] and has [facility] card   [employer]  August 08, 2009 5:48 PM   *PATIENT WAS GIVEN DM CARD.Lela Sturdivant NT II  June 08, 2010 3:56 PM      Divorced, one daughter currently staying with him   Has 7 grand children   Reports disability and medicaid is pending   Social Determinants of Radio broadcast assistant Strain:   . Difficulty of Paying Living Expenses:   Food Insecurity:   . Worried About Charity fundraiser in the Last Year:   . Arboriculturist in the Last Year:   Transportation Needs:   . Film/video editor (Medical):   Marland Kitchen Lack of Transportation (Non-Medical):   Physical Activity:   . Days of Exercise per Week:   . Minutes of Exercise per Session:   Stress:   . Feeling of Stress :   Social Connections:   . Frequency of Communication with Friends and Family:   . Frequency of Social Gatherings with Friends and Family:   . Attends Religious Services:   . Active Member of Clubs or Organizations:   . Attends Archivist Meetings:   Marland Kitchen Marital Status:      Review of Systems: A 12 point ROS discussed and pertinent positives are indicated in the HPI above.  All other systems are negative.  Review of Systems  Constitutional: Negative  for chills and fever.  Respiratory: Negative for shortness of breath and wheezing.   Cardiovascular: Negative for chest pain and palpitations.  Gastrointestinal: Negative for abdominal pain.  Neurological: Negative for headaches.  Psychiatric/Behavioral: Negative for behavioral problems and confusion.    Vital Signs: BP (!) 149/68 (BP  Location: Right Arm)   Pulse 80   Temp 98.9 F (37.2 C) (Oral)   Resp 15   Ht 5' 11.5" (1.816 m)   Wt 200 lb 9.9 oz (91 kg)   SpO2 95%   BMI 27.59 kg/m   Physical Exam Vitals and nursing note reviewed.  Constitutional:      General: He is not in acute distress.    Appearance: Normal appearance.  Cardiovascular:     Rate and Rhythm: Normal rate and regular rhythm.     Heart sounds: Normal heart sounds. No murmur heard.   Pulmonary:     Effort: Pulmonary effort is normal. No respiratory distress.     Breath sounds: Normal breath sounds. No wheezing.  Skin:    General: Skin is warm and dry.     Comments: LUE AVF without erythema, skin breakdown, or active bleeding/drainage; no palpable thrill; (+) bruit on ascultation.  Neurological:     Mental Status: He is alert and oriented to person, place, and time.      MD Evaluation Airway: WNL Heart: WNL Abdomen: WNL Chest/ Lungs: WNL ASA  Classification: 3 Mallampati/Airway Score: Two   Imaging: DG Chest 2 View  Result Date: 01/04/2020 CLINICAL DATA:  Back pain, weakness and shortness of breath EXAM: CHEST - 2 VIEW COMPARISON:  Radiograph 12/30/2019 FINDINGS: Tunneled dual lumen dialysis catheter tip terminates at the superior cavoatrial junction, similar position to prior. Cardiomediastinal contours are unremarkable. Telemetry leads overlie the chest. Some hazy interstitial opacities are present throughout the lungs with central vascular congestion and cephalization. No visible pneumothorax or effusion. No acute osseous or soft tissue abnormality. IMPRESSION: Hazy opacities likely reflect  edema/volume overload given vascular congestion and redistribution though should correlate with clinical setting as atypical infection could present similarly. Electronically Signed   By: Lovena Le M.D.   On: 01/04/2020 15:02   DG Chest 2 View  Result Date: 12/30/2019 CLINICAL DATA:  Chest pain. EXAM: CHEST - 2 VIEW COMPARISON:  02/11/2019 FINDINGS: Right-sided dialysis catheter tip in the lower SVC. Upper normal heart size with improved cardiomegaly from prior exam. Minor basilar atelectasis. No confluent airspace disease. No pulmonary edema. No pleural effusion or pneumothorax. No acute osseous abnormalities are seen. IMPRESSION: Minor basilar atelectasis. Electronically Signed   By: Keith Rake M.D.   On: 12/30/2019 22:27   DG Cervical Spine 2-3 Views  Result Date: 01/06/2020 CLINICAL DATA:  Cervicothoracic laminectomy infusion EXAM: CERVICAL SPINE - 2-3 VIEW; DG C-ARM 1-60 MIN COMPARISON:  None. FLUOROSCOPY TIME:  Fluoroscopy Time:  10.8 seconds Radiation Exposure Index (if provided by the fluoroscopic device): 2.51 mGy Number of Acquired Spot Images: 2 FINDINGS: Posterior fusion at C4-T1 is noted. The inferior aspect is incompletely evaluated on the lateral film. IMPRESSION: Cervicothoracic fusion. Electronically Signed   By: Inez Catalina M.D.   On: 01/06/2020 16:46   CT HEAD WO CONTRAST  Result Date: 01/11/2020 CLINICAL DATA:  69 year old male with altered mental status. Cervical spine epidural abscess, postop day 5. Intubated. EXAM: CT HEAD WITHOUT CONTRAST TECHNIQUE: Contiguous axial images were obtained from the base of the skull through the vertex without intravenous contrast. COMPARISON:  Brain MRI 12/31/2019.  Cervical spine MRI 01/04/2020. FINDINGS: Brain: Abnormal appearance of the visible upper cervical spinal canal on series 3, image 1, with epidural fluid or soft tissue, probably not significantly changed from the abnormal cervical MRI 01/04/2020 (please see that report) No  ventriculomegaly. No intracranial mass effect. Basilar cisterns remain patent. Stable chronic lacunar  infarct in the thalami greater on the right. Elsewhere cerebral gray-white matter differentiation remains within normal limits. No cortically based acute infarct identified. No acute intracranial hemorrhage identified. Vascular: No suspicious intracranial vascular hyperdensity. Skull: The skull appears stable and intact. Sinuses/Orbits: Minimal fluid in a posterior right ethmoid air cell. Other Visualized paranasal sinuses and mastoids are stable and well pneumatized. Other: Partially visible midline suboccipital postoperative changes to the scalp (series 4, image 1). No other acute orbit or scalp soft tissue finding. IMPRESSION: 1. No acute intracranial abnormality. Stable thalamic small vessel disease. 2. Continued abnormal upper cervical epidural space. Partially visible cervical posterior midline postoperative changes. Electronically Signed   By: Genevie Ann M.D.   On: 01/11/2020 01:21   CT Head Wo Contrast  Result Date: 12/31/2019 CLINICAL DATA:  Headache. EXAM: CT HEAD WITHOUT CONTRAST TECHNIQUE: Contiguous axial images were obtained from the base of the skull through the vertex without intravenous contrast. COMPARISON:  None. FINDINGS: Brain: Mild chronic ischemic white matter disease is noted. No mass effect or midline shift is noted. Ventricular size is within normal limits. There is no evidence of mass lesion, hemorrhage or acute infarction. Vascular: No hyperdense vessel or unexpected calcification. Skull: Normal. Negative for fracture or focal lesion. Sinuses/Orbits: No acute finding. Other: None. IMPRESSION: Mild chronic ischemic white matter disease. No acute intracranial abnormality seen. Electronically Signed   By: Marijo Conception M.D.   On: 12/31/2019 09:19   MR BRAIN WO CONTRAST  Result Date: 01/11/2020 CLINICAL DATA:  Mental status change EXAM: MRI HEAD WITHOUT CONTRAST TECHNIQUE: Multiplanar,  multiecho pulse sequences of the brain and surrounding structures were obtained without intravenous contrast. COMPARISON:  12/31/2019 FINDINGS: Brain: There is no acute infarction or intracranial hemorrhage. There is no intracranial mass, mass effect, or edema. There is no hydrocephalus or extra-axial fluid collection. Prominence of the ventricles and sulci reflects mild generalized parenchymal volume loss. Patchy and confluent areas of T2 hyperintensity in the supratentorial white matter are nonspecific but may reflect moderate chronic microvascular ischemic changes. There are chronic small vessel infarcts of the thalamus and right pons. Several small foci of susceptibility hypointensity are present likely reflecting chronic microhemorrhages also present on the prior study. Vascular: Major vessel flow voids at the skull base are preserved. Skull and upper cervical spine: Normal marrow signal is preserved. Sinuses/Orbits: Paranasal sinuses are aerated. Orbits are unremarkable. Other: Sella is unremarkable.  Mastoid air cells are clear. IMPRESSION: No evidence of recent infarction, hemorrhage, mass. Stable chronic findings detailed above. Electronically Signed   By: Macy Mis M.D.   On: 01/11/2020 11:56   MR BRAIN WO CONTRAST  Result Date: 12/31/2019 CLINICAL DATA:  Headache.  Difficulty walking.  Regional pain. EXAM: MRI HEAD WITHOUT CONTRAST TECHNIQUE: Multiplanar, multiecho pulse sequences of the brain and surrounding structures were obtained without intravenous contrast. COMPARISON:  Head CT same day FINDINGS: Brain: Diffusion imaging does not show any acute or subacute infarction. Old small vessel infarction in the right para median pons. No focal cerebellar finding. Cerebral hemispheres show an old lacunar infarction in the medial thalamus bilaterally. There are moderate chronic small-vessel ischemic changes of the cerebral hemispheric white matter. No cortical or large vessel territory infarction. No  mass lesion, hemorrhage, hydrocephalus or extra-axial collection. No lesion seen of the perimesencephalic cisterns. Vascular: Major vessels at the base of the brain show flow. Skull and upper cervical spine: Negative Sinuses/Orbits: Clear/normal Other: None IMPRESSION: No acute, subacute or reversible finding. Old small vessel infarctions of the  pons, thalami and cerebral hemispheric white matter. Electronically Signed   By: Nelson Chimes M.D.   On: 12/31/2019 12:27   MR CERVICAL SPINE WO CONTRAST  Result Date: 01/04/2020 CLINICAL DATA:  Severe diffuse back pain. Study was ordered without and contrast. None contrast imaging the cervical spine was completed. Patient refused further imaging due to pain. Study is moderately degraded by patient motion. EXAM: MRI CERVICAL SPINE WITHOUT CONTRAST TECHNIQUE: Multiplanar, multisequence MR imaging of the cervical spine was performed. No intravenous contrast was administered. COMPARISON:  MR head without contrast 12/31/2019. FINDINGS: Alignment: Slight retrolisthesis is present at C5-6. Degenerative anterolisthesis is present T1-2. Reversal of the normal cervical lordosis is noted. Vertebrae: Decreased T1 marrow signal is present in the lower cervical spine, particularly C7 and T1. Vertebral body heights are maintained. Cord: T2 signal hyperintensity is present in the spinal cord from C3-4 through C5. Posterior Fossa, vertebral arteries, paraspinal tissues: Visualized brainstem and cerebellum are normal. Diffuse edema is present in the occipital paraspinous musculature bilaterally. No discrete abscess is present. Extensive prevertebral edema extends from the clivus to C6. No abscess is present. Mild edema is present in the posterior paraspinous muscles, left greater than right. Disc levels: a abnormal fluid present in the dorsal spinal canal extending from foramen magnum to the T2 level. Severe disc disease present at C5-6 and C6-7. The cord is compressed at these levels.  Mild disc disease present at C3-4 and C4-5 with some cord compression at these levels. IMPRESSION: 1. Abnormal fluid in the dorsal spinal canal extending from foramen magnum to the T2 level. Given the other inflammatory changes and concern, this most likely represents epidural infection. There is some fluid ventral to the spinal cord at C2. 2. Diffuse prevertebral edema from the clivus to C6 is also concerning for infection. 3. The cord is compressed without abnormal signal suggesting edema. 4. Mild edema in the posterior paraspinous muscles, left greater than right. Edema is most significant within the paraspinous muscles just below the occiput. No focal abscess is present. 5. Severe disc disease at C5-6 and C6-7 with moderate central canal stenosis at both levels. 6. Mild disc disease at C3-4 and C4-5 with some cord compression at these levels. 7. Abnormal marrow signal in the lower cervical spine, particularly C7 and T1. This is nonspecific and can be seen in the setting of anemia, smoking, or obesity. Electronically Signed   By: San Morelle M.D.   On: 01/04/2020 18:51   MR THORACIC SPINE WO CONTRAST  Result Date: 01/05/2020 CLINICAL DATA:  Epidural fluid collection.  Back pain and neck pain. EXAM: MRI THORACIC SPINE WITHOUT CONTRAST TECHNIQUE: Multiplanar, multisequence MR imaging of the thoracic spine was performed. No intravenous contrast was administered. COMPARISON:  None. FINDINGS: Alignment:  Physiologic. Vertebrae: No fracture, evidence of discitis, or bone lesion. Cord: Dorsal epidural fluid collection extending from the cervical spine to T2 level is again seen. There is no continuation of the collection below the T2 level. Spinal cord signal is normal. Paraspinal and other soft tissues: Negative. Disc levels: At T2-3, there is a small right subarticular disc protrusion. There is no other thoracic disc herniation. No spinal canal or neural foraminal stenosis. IMPRESSION: 1. Dorsal epidural  fluid collection extending from the cervical spine to T2 level, consistent with epidural abscess. No continuation below the T2 level. 2. No thoracic spinal canal or neural foraminal stenosis. Electronically Signed   By: Ulyses Jarred M.D.   On: 01/05/2020 19:42   MR LUMBAR SPINE  WO CONTRAST  Result Date: 01/06/2020 CLINICAL DATA:  In stage renal disease patient on hemodialysis. Worsening headache and back pain. Assess for evidence of infection. Cervicothoracic dorsal epidural fluid collection. EXAM: MRI LUMBAR SPINE WITHOUT CONTRAST TECHNIQUE: Multiplanar, multisequence MR imaging of the lumbar spine was performed. No intravenous contrast was administered. COMPARISON:  Cervical study done 2 days ago. Thoracic study done yesterday. FINDINGS: Segmentation:  5 lumbar type vertebral bodies assumed. Alignment:  2 mm degenerative anterolisthesis L2-3, L3-4 and L4-5. Vertebrae: No fracture or primary bone lesion. No finding to suggest discitis or osteomyelitis. See below regarding facet arthropathy. Conus medullaris and cauda equina: Conus extends to the T12-L1 level. There is what appears to be a subdural fluid collection at the posterior left quadrant extending from T12-L1 to L2-3, with maximal size on the axial images 7 x 11 mm in cross-sectional diameter. This causes mass effect upon the neural structures, crowding them anteriorly. Additionally, there is probably some epidural material posteriorly at L3 to L4. There may be some subdural component of this as well. Paraspinal and other soft tissues: Nonspecific edematous change of the posterior para spinous musculature, right worse than left. See below. Disc levels: L1-2: No significant disc pathology. No canal stenosis. See above regarding subdural collection. L2-3: Facet arthropathy with 2 mm of anterolisthesis. Bulging of the disc. Mild multifactorial spinal stenosis. Some facet edema on both sides. This does not appear pronounced, but facet joint infection is not  excluded. L3-4: Facet arthropathy right more than left with 2 mm of anterolisthesis. Bulging of the disc. Moderate to severe multifactorial spinal stenosis that could cause neural compression. Facet arthropathy may simply be degenerative, but facet infection is not excluded, particularly on the right. L4-5: Facet arthropathy with 2 mm of anterolisthesis. Bulging of the disc. Severe multifactorial spinal stenosis that could cause neural compression. Facet edema right more than left that could be degenerative or potentially relate to infection. L5-S1: Mild bulging of the disc. Mild facet osteoarthritis. No apparent compressive stenosis. IMPRESSION: 1. There is what appears to be a subdural fluid collection at the posterior left quadrant extending from T12-L1 to L2-3, with maximal diameter on the axial images 7 x 11 mm in cross-sectional diameter. This causes mass effect upon the neural structures, crowding them anteriorly. Additionally, there is probably some epidural material posteriorly at L3 to L4. This could be a small subdural component of this as well. This could be infected material. 2. 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 There is 2 mm of anterolisthesis at L2-3, L3-4 and L4-5. There is disc degeneration with bulging of the discs. There is moderate to severe multifactorial spinal stenosis at L3-4 and severe spinal stenosis at L4-5 that could cause neural compression. Facet joint edema at L2-3, L3-4 and L4-5 could be degenerative or potentially relate to infection. The single most suspicious looking facet joint is on the right at L3-4. Additionally, the posterior para spinous soft tissue structures show edematous change which could be inflammatory. Electronically Signed   By: Nelson Chimes M.D.   On: 01/06/2020 11:25   US AORTA  Result Date: 01/09/2020 CLINICAL DATA:  Pulsatile abdominal mass. Evaluate for possible abdominal aortic aneurysm. EXAM: ULTRASOUND OF  ABDOMINAL AORTA TECHNIQUE: Ultrasound examination of the abdominal aorta and proximal common iliac arteries was performed to evaluate for aneurysm. Additional color and Doppler images of the distal aorta were obtained to document patency. COMPARISON:  CT abdomen and pelvis 03/29/2012 FINDINGS: Abdominal aortic measurements as follows:  Proximal:  2.6 x 2.6 cm Mid:  2.2 x 2.6 cm Distal:  1.9 x 1.8 cm Patent: Yes, peak systolic velocity is 379.0 cm/s Right common iliac artery: 1.3 x 1.5 cm Left common iliac artery: 1.2 x 1.2 cm Study quality is degraded by patient motion and overlying bowel gas. IMPRESSION: 1. No evidence for abdominal aortic aneurysm. 2. Ectatic abdominal aorta at risk for aneurysm development. Recommend followup by ultrasound in 5 years. This recommendation follows ACR consensus guidelines: White Paper of the ACR Incidental Findings Committee II on Vascular Findings. J Am Coll. Electronically Signed   By: Nolon Nations M.D.   On: 01/09/2020 09:15   IR Removal Tun Cv Cath W/O FL  Result Date: 01/10/2020 INDICATION: Functioning fistula now. EXAM: REMOVAL TUNNELED CENTRAL VENOUS CATHETER MEDICATIONS: 1% lidocaine 10 mL COMPLICATIONS: None immediate PROCEDURE: Informed written consent was obtained from the patient after a thorough discussion of the procedural risks, benefits and alternatives. All questions were addressed. Maximal Sterile Barrier Technique was utilized including caps, mask, sterile gowns, sterile gloves, sterile drape, hand hygiene and skin antiseptic. A timeout was performed prior to the initiation of the procedure. The patient's right chest and catheter was prepped and draped in a normal sterile fashion. Heparin was removed from both ports of catheter. 1% lidocaine was used for local anesthesia. Using gentle blunt dissection the cuff of the catheter was exposed and the catheter was removed in it's entirety. Pressure was held till hemostasis was obtained. A sterile dressing was  applied. The patient tolerated the procedure well with no immediate complications. IMPRESSION: Successful catheter removal as described above. Read by: Soyla Dryer, NP Electronically Signed   By: Lucrezia Europe M.D.   On: 01/07/2020 17:03   DG Chest Port 1 View  Result Date: 01/12/2020 CLINICAL DATA:  Respiratory failure. EXAM: PORTABLE CHEST 1 VIEW COMPARISON:  The chest x-ray 01/11/2020 FINDINGS: The heart size is normal. Endotracheal tube terminates 5.5 cm above the carina. Enteric tube courses off the inferior border the film. Right IJ line is stable. Left lower lobe airspace opacity is stable. No new airspace disease is present. IMPRESSION: 1. Stable left lower lobe airspace disease. 2. Support apparatus is stable. Electronically Signed   By: San Morelle M.D.   On: 01/12/2020 05:49   DG CHEST PORT 1 VIEW  Result Date: 01/11/2020 CLINICAL DATA:  Respiratory distress. EXAM: PORTABLE CHEST 1 VIEW COMPARISON:  Earlier film, same date. FINDINGS: The endotracheal tube, NG tube and right IJ catheters are stable. The cardiac silhouette, mediastinal and hilar contours are within normal limits and stable. The lungs are clear. IMPRESSION: 1. Stable support apparatus. 2. No acute cardiopulmonary findings. Electronically Signed   By: Marijo Sanes M.D.   On: 01/11/2020 05:45   DG CHEST PORT 1 VIEW  Result Date: 01/11/2020 CLINICAL DATA:  69 year old male intubated, enteric tube placement. Recent posterior C4-T1 fusion. EXAM: PORTABLE CHEST 1 VIEW COMPARISON:  Portable chest 01/10/2020 and earlier. FINDINGS: Portable AP semi upright view at 0024 hours. Stable right IJ central line. Endotracheal tube tip projects about 8 mm above the carina. Enteric tube courses to the abdomen and into the stomach. Improved lung volumes. Mediastinal contours remain within normal limits. Allowing for portable technique the lungs are clear. Negative visible bowel gas pattern. Posterior cervicothoracic spinal fusion hardware  with skin staples. IMPRESSION: 1. Endotracheal tube tip approximately 8 mm above the carina. Enteric tube courses to the stomach. 2.  No acute cardiopulmonary abnormality. Electronically Signed   By:  Genevie Ann M.D.   On: 01/11/2020 00:56   DG Chest Port 1 View  Result Date: 01/10/2020 CLINICAL DATA:  69 year old male status post central line placement. EXAM: PORTABLE CHEST 1 VIEW COMPARISON:  Radiograph dated 01/04/2020 FINDINGS: Right IJ central venous line with tip over upper SVC. No pneumothorax. There is no focal consolidation, or pleural effusion. Top-normal cardiac size. No acute osseous pathology. Partially visualized lower cervical fusion. IMPRESSION: Right IJ central venous line with tip over upper SVC. No pneumothorax. Electronically Signed   By: Anner Crete M.D.   On: 01/10/2020 18:45   DG Abd Portable 1V  Result Date: 01/11/2020 CLINICAL DATA:  69 year old male intubated, enteric tube placement. EXAM: PORTABLE ABDOMEN - 1 VIEW COMPARISON:  Abdominal radiograph 09/05/2012. FINDINGS: Portable AP supine view at 0032 hours. Enteric tube has been placed into the stomach with side hole at the level of the gastric antrum. Tube tip may be within the proximal duodenum. Non obstructed bowel gas pattern. No acute osseous abnormality identified. IMPRESSION: Enteric tube placed into the stomach, tube tip may be within the proximal duodenum. Electronically Signed   By: Genevie Ann M.D.   On: 01/11/2020 00:54   DG C-Arm 1-60 Min  Result Date: 01/06/2020 CLINICAL DATA:  Cervicothoracic laminectomy infusion EXAM: CERVICAL SPINE - 2-3 VIEW; DG C-ARM 1-60 MIN COMPARISON:  None. FLUOROSCOPY TIME:  Fluoroscopy Time:  10.8 seconds Radiation Exposure Index (if provided by the fluoroscopic device): 2.51 mGy Number of Acquired Spot Images: 2 FINDINGS: Posterior fusion at C4-T1 is noted. The inferior aspect is incompletely evaluated on the lateral film. IMPRESSION: Cervicothoracic fusion. Electronically Signed   By: Inez Catalina M.D.   On: 01/06/2020 16:46   ECHOCARDIOGRAM COMPLETE  Result Date: 01/07/2020    ECHOCARDIOGRAM REPORT   Patient Name:   Jibreel Fedewa. Date of Exam: 01/07/2020 Medical Rec #:  [id number]          Height:       71.5 in Accession #:    [id number]         Weight:       211.4 lb Date of Birth:  1950-08-10         BSA:          2.170 m Patient Age:    33 years           BP:           162/70 mmHg Patient Gender: M                  HR:           67 bpm. Exam Location:  Inpatient Procedure: 2D Echo, Cardiac Doppler and Color Doppler Indications:    Bacteremia.  History:        Patient has no prior history of Echocardiogram examinations.                 Signs/Symptoms:Murmur; Risk Factors:Current Smoker and Diabetes.                 MRSA. Cancer. ESRD.  Sonographer:    Roseanna Rainbow RDCS Referring Phys: [id number] [facility] AMIN  Sonographer Comments: Technically difficult study due to poor echo windows. Patient appears confused, had trouble stayiong still during exam. Cervical brace prevented optimal positioning and windows. IMPRESSIONS  1. Left ventricular ejection fraction, by estimation, is >75%. The left ventricle has hyperdynamic function. The left ventricle has no regional wall motion abnormalities. There is mild left ventricular hypertrophy.  Left ventricular diastolic parameters are consistent with Grade II diastolic dysfunction (pseudonormalization). Elevated left atrial pressure.  2. Right ventricular systolic function is normal. The right ventricular size is normal.  3. The mitral valve is normal in structure. Trivial mitral valve regurgitation. No evidence of mitral stenosis.  4. The aortic valve is tricuspid. Aortic valve regurgitation is not visualized. Mild aortic valve sclerosis is present, with no evidence of aortic valve stenosis.  5. The inferior vena cava is normal in size with greater than 50% respiratory variability, suggesting right atrial pressure of 3 mmHg. FINDINGS  Left Ventricle: Left  ventricular ejection fraction, by estimation, is >75%. The left ventricle has hyperdynamic function. The left ventricle has no regional wall motion abnormalities. The left ventricular internal cavity size was normal in size. There is mild left ventricular hypertrophy. Left ventricular diastolic parameters are consistent with Grade II diastolic dysfunction (pseudonormalization). Elevated left atrial pressure. Right Ventricle: The right ventricular size is normal.Right ventricular systolic function is normal. Left Atrium: Left atrial size was normal in size. Right Atrium: Right atrial size was normal in size. Pericardium: There is no evidence of pericardial effusion. Mitral Valve: The mitral valve is normal in structure. Normal mobility of the mitral valve leaflets. Trivial mitral valve regurgitation. No evidence of mitral valve stenosis. Tricuspid Valve: The tricuspid valve is normal in structure. Tricuspid valve regurgitation is trivial. No evidence of tricuspid stenosis. Aortic Valve: The aortic valve is tricuspid. Aortic valve regurgitation is not visualized. Mild aortic valve sclerosis is present, with no evidence of aortic valve stenosis. Pulmonic Valve: The pulmonic valve was not well visualized. Pulmonic valve regurgitation is not visualized. No evidence of pulmonic stenosis. Aorta: The aortic root is normal in size and structure. Venous: The inferior vena cava is normal in size with greater than 50% respiratory variability, suggesting right atrial pressure of 3 mmHg. IAS/Shunts: No atrial level shunt detected by color flow Doppler.  LEFT VENTRICLE PLAX 2D LVIDd:         5.10 cm     Diastology LVIDs:         3.10 cm     LV e' lateral:   10.10 cm/s LV PW:         1.40 cm     LV E/e' lateral: 11.3 LV IVS:        1.00 cm     LV e' medial:    6.64 cm/s LVOT diam:     2.10 cm     LV E/e' medial:  17.2 LV SV:         105 LV SV Index:   48 LVOT Area:     3.46 cm  LV Volumes (MOD) LV vol d, MOD A2C: 71.2 ml LV vol d,  MOD A4C: 90.8 ml LV vol s, MOD A2C: 23.8 ml LV vol s, MOD A4C: 22.6 ml LV SV MOD A2C:     47.4 ml LV SV MOD A4C:     90.8 ml LV SV MOD BP:      61.5 ml RIGHT VENTRICLE             IVC RV S prime:     17.00 cm/s  IVC diam: 1.50 cm TAPSE (M-mode): 3.1 cm LEFT ATRIUM           Index       RIGHT ATRIUM           Index LA diam:      3.00 cm 1.38 cm/m  RA Area:  12.30 cm LA Vol (A2C): 21.2 ml 9.77 ml/m  RA Volume:   23.40 ml  10.78 ml/m LA Vol (A4C): 47.8 ml 22.03 ml/m  AORTIC VALVE LVOT Vmax:   151.00 cm/s LVOT Vmean:  88.600 cm/s LVOT VTI:    0.302 m  AORTA Ao Root diam: 3.30 cm MITRAL VALVE MV Area (PHT): 3.06 cm     SHUNTS MV Decel Time: 248 msec     Systemic VTI:  0.30 m MV E velocity: 114.00 cm/s  Systemic Diam: 2.10 cm MV A velocity: 111.00 cm/s MV E/A ratio:  1.03 Kirk Ruths MD Electronically signed by Kirk Ruths MD Signature Date/Time: 01/07/2020/3:10:33 PM    Final     Labs:  CBC: Recent Labs    01/15/20 0410 01/16/20 0338 01/17/20 0349 01/19/20 0332  WBC 13.0* 11.1* 10.6* 8.9  HGB 7.2* 6.9* 7.7* 7.9*  HCT 21.8* 21.1* 23.5* 23.8*  PLT 228 224 220 204    COAGS: Recent Labs    12/30/19 2212 01/04/20 1213  INR 1.3* 1.3*    BMP: Recent Labs    01/16/20 0338 01/17/20 0349 01/18/20 0412 01/19/20 0332  NA 132* 132* 134* 132*  K 4.0 4.5 3.7 4.0  CL 97* 96* 96* 95*  CO2 24 21* 26 26  GLUCOSE 151* 120* 168* 93  BUN 55* 65* 31* 42*  CALCIUM 7.5* 7.4* 7.5* 7.4*  CREATININE 8.40* 10.71* 6.75* 9.05*  GFRNONAA 6* 4* 8* 5*  GFRAA 7* 5* 9* 6*    LIVER FUNCTION TESTS: Recent Labs    02/11/19 1721 02/11/19 1721 01/04/20 1213 01/09/20 0307 01/10/20 1355 01/11/20 0202 01/13/20 0413 01/14/20 0330 01/16/20 0338 01/17/20 0349 01/18/20 0412 01/19/20 0332  BILITOT 0.7  --  1.1  --  2.5*  --  2.3*  --   --   --   --   --   AST 21  --  21  --  27  --  30  --   --   --   --   --   ALT 14  --  20  --  24  --  20  --   --   --   --   --   ALKPHOS 54  --  84  --  98   --  77  --   --   --   --   --   PROT 6.8  --  9.1*  --  7.6  --  6.5  --   --   --   --   --   ALBUMIN 3.0*   < > 3.2*   < > 1.9*   < > 1.8*   < > 1.7* 1.8* 1.7* 1.7*   < > = values in this interval not displayed.     Assessment and Plan:  ESRD on HD via LUE AVF with difficulty with cannulation s/p nontunneled HD catheter placement bedside by CCM. Plan for image-guided fistulagram with possible intervention (with possible tunneled HD catheter placement) in IR tentatively for tomorrow 01/20/2020 pending IR scheduling and patient's agreement to sign consent. Patient will be NPO at midnight. Afebrile and WBCs WNL.  Risks and benefits discussed with the patient including, but not limited to bleeding, infection, vascular injury, pneumothorax which may require chest tube placement, air embolism or even death. All of the patient's questions were answered. At this time, patient refuses to sign consent until he speaks with his family members regarding procedure. This PA offered to call  patient's family with/for patient, however he refused stating "I'll do it myself later I am tired now". Will plan for procedure tentatively for tomorrow, IR PA to obtain consent tomorrow prior to procedure.   Thank you for this interesting consult.  I greatly enjoyed meeting Cohen Juda Toepfer. and look forward to participating in their care.  A copy of this report was sent to the requesting provider on this date.  Electronically Signed: Earley Abide, PA-C 01/19/2020, 1:51 PM   I spent a total of 40 Minutes in face to face in clinical consultation, greater than 50% of which was counseling/coordinating care for ESRD on HD/fistulagram with possible intervention.

## 2020-01-19 NOTE — Procedures (Signed)
I was present at this dialysis session. I have reviewed the session itself and made appropriate changes.   Filed Weights   01/13/20 0500 01/14/20 0338 01/19/20 0804  Weight: 91.2 kg 96.4 kg 91 kg    Recent Labs  Lab 01/19/20 0332  NA 132*  K 4.0  CL 95*  CO2 26  GLUCOSE 93  BUN 42*  CREATININE 9.05*  CALCIUM 7.4*  PHOS 6.3*    Recent Labs  Lab 01/16/20 0338 01/17/20 0349 01/19/20 0332  WBC 11.1* 10.6* 8.9  HGB 6.9* 7.7* 7.9*  HCT 21.1* 23.5* 23.8*  MCV 82.4 81.9 82.6  PLT 224 220 204    Scheduled Meds:  (feeding supplement) PROSource Plus  30 mL Oral BID BM   amLODipine  10 mg Oral Daily   atorvastatin  20 mg Oral QHS   chlorhexidine gluconate (MEDLINE KIT)  15 mL Mouth Rinse BID   Chlorhexidine Gluconate Cloth  6 each Topical Q0600   darbepoetin (ARANESP) injection - DIALYSIS  100 mcg Intravenous Q Mon-HD   enoxaparin (LOVENOX) injection  30 mg Subcutaneous Q24H   feeding supplement (NEPRO CARB STEADY)  237 mL Oral BID BM   HYDROcodone-acetaminophen  1 tablet Oral Q6H   insulin aspart  0-9 Units Subcutaneous Q4H   insulin detemir  11 Units Subcutaneous BID   lidocaine  1 patch Transdermal Q24H   metoprolol tartrate  25 mg Oral BID   multivitamin  1 tablet Oral QHS   QUEtiapine  12.5 mg Oral QHS   rifampin  300 mg Oral Q12H   sevelamer carbonate  1,600 mg Oral TID WC   sodium chloride flush  10-40 mL Intracatheter Q12H   Continuous Infusions:  sodium chloride 10 mL/hr at 01/13/20 1700   thiamine injection 500 mg (01/18/20 0905)   Vancomycin     vancomycin 750 mg (01/17/20 1044)   PRN Meds:.acetaminophen, clonazePAM, hydrALAZINE, [DISCONTINUED] ondansetron **OR** ondansetron (ZOFRAN) IV, polyethylene glycol, senna-docusate, sodium chloride flush   Gean Quint, MD [facility] 01/19/2020, 10:22 AM

## 2020-01-19 NOTE — TOC Progression Note (Signed)
Transition of Care (TOC) - Progression Note    Patient Details  Name: [name]. MRN: [id number] Date of Birth: [birth year]  Transition of Care [facility] CM/SW [place], RN Phone Number: 01/19/2020, 9:13 AM  Clinical Narrative:    Case management spoke with [name] this morning and offered medicare choice regarding SNF placement - she chose [facility]. Patient's COVID screen will be repeated prior to transfer to the facility.  The patient is currently in dialysis and will most probably transfer to [facility] tomorrow on a non-dialysis day.  Will continue to follow to transfer.   Expected Discharge Plan: Skilled Nursing Facility Barriers to Discharge: Continued Medical Work up  Expected Discharge Plan and Services Expected Discharge Plan: [place]   Discharge Planning Services: CM Consult Post Acute Care Choice: [place]                                         Social Determinants of Health (SDOH) Interventions    Readmission Risk Interventions Readmission Risk Prevention Plan 01/18/2020  Transportation Screening Complete  Medication Review Press photographer) Complete  PCP or Specialist appointment within 3-5 days of discharge Complete  HRI or Home Care Consult Complete  SW Recovery Care/Counseling Consult Complete  Palliative Care Screening Complete  Skilled Nursing Facility Complete  Some recent data might be hidden

## 2020-01-20 LAB — COMPREHENSIVE METABOLIC PANEL
ALT: 17 U/L (ref 0–44)
AST: 27 U/L (ref 15–41)
Albumin: 1.9 g/dL — ABNORMAL LOW (ref 3.5–5.0)
Alkaline Phosphatase: 95 U/L (ref 38–126)
Anion gap: 11 (ref 5–15)
BUN: 23 mg/dL (ref 8–23)
CO2: 26 mmol/L (ref 22–32)
Calcium: 7.4 mg/dL — ABNORMAL LOW (ref 8.9–10.3)
Chloride: 96 mmol/L — ABNORMAL LOW (ref 98–111)
Creatinine, Ser: 6.83 mg/dL — ABNORMAL HIGH (ref 0.61–1.24)
GFR calc Af Amer: 9 mL/min — ABNORMAL LOW (ref >60)
GFR calc non Af Amer: 8 mL/min — ABNORMAL LOW (ref >60)
Glucose, Bld: 131 mg/dL — ABNORMAL HIGH (ref 70–99)
Potassium: 3.6 mmol/L (ref 3.5–5.1)
Sodium: 133 mmol/L — ABNORMAL LOW (ref 135–145)
Total Bilirubin: 1.2 mg/dL (ref 0.3–1.2)
Total Protein: 7 g/dL (ref 6.5–8.1)

## 2020-01-20 LAB — PHOSPHORUS: Phosphorus: 4.5 mg/dL (ref 2.5–4.6)

## 2020-01-20 LAB — GLUCOSE, CAPILLARY
Glucose-Capillary: 115 mg/dL — ABNORMAL HIGH (ref 70–99)
Glucose-Capillary: 95 mg/dL (ref 70–99)

## 2020-01-20 MED ORDER — CALCITRIOL 0.5 MCG PO CAPS
1.2500 ug | ORAL_CAPSULE | ORAL | Status: DC
  Administered 2020-01-24: 1.25 ug via ORAL
  Filled 2020-01-20 (×2): qty 1

## 2020-01-20 MED ORDER — NEPRO/CARBSTEADY PO LIQD
237.0000 mL | Freq: Every day | ORAL | Status: DC
  Administered 2020-01-21: 237 mL via ORAL

## 2020-01-20 NOTE — Progress Notes (Signed)
PROGRESS NOTE    [name].  UXY:[id number] DOB: 04-Feb-1951 DOA: 01/04/2020 PCP: [facility]    Brief Narrative:  Rodney Pennings. is an 69 y.o. male  veteran withhistory of ESRD on hemodialysis on Monday Wednesday Friday, hypertension, diabetes mellitus type 2, anemia history of colon cancer status post resection has been having increasing headache back pain and found to have epidural abscess and is s/p laminectomy and MRSA septicemia.  Required intubation due to AMS-- continues to have issues with short term memory and sundowning.   Assessment & Plan:   Principal Problem:   MRSA bacteremia Active Problems:   TOBACCO ABUSE   OBSTRUCTIVE SLEEP APNEA   Colon cancer, status Post resection    HYPERTENSION   ESRD (end stage renal disease) ([place])   Epidural abscess   Acute encephalopathy   Sepsis ([place])   Bacteremia associated with intravascular line ([place])   Hemodialysis patient ([place])   Acute hypoxic respiratory failure with compromised airwaysecondary to acute encephalopathy Patient was briefly intubated requiring ventilatory support on 01/11/2020 and successfully expired on 01/13/2020.  Oxygenating well on room air.  Acute metabolic toxic encephalopathy: short term memory seems to be impaired and I have concerns about his ability to make his own medical decisions currently CT Head and MRI brain negative for acute intracranial abnormalities; Suspect to be secondary to sepsis vs substance abuse and doubt uremia currently.He had HD sessions per MWF schedule with improvement in mental status. --Continue seroquel Qhs for sundowning --Continue high-dose thiamine  MRSA bacteremia  Epidural abscess s/p laminectomy and fusion: --WBC 25>>>8.9 --Continued on vancomycin q M/W/F w/ HD and rifampin 361m PO BID x 8 weeks total per ID; through October 1 per ID --Will need weekly LFTs on rifampin; weekly ESR/CRP --Vanco level per pharmacy --Outpatient appointment with ID  on September 2 at 11 AM with SJanene Madeira NP  ESRD on HD: Patient continues to have issues regarding his AV fistula requiring use of a temporary right IJ catheter placed by PCCM on 01/10/2020. --IR consulted for fistulogram and potential need for tunneled dialysis catheter; plan to proceed with procedure today --Continue HD per nephrology  Anemia of chronic disease: No signs of active bleeding.Suspect secondary to acute illness and anemia of chronic disease.  Received 1 unit PRBC on 01/16/2020. --aranesp per nephrology  Type 2 DM with hyperglycemia Hemoglobin A1c 6.5 on 01/12/2020, well controlled --Levemir 11u [place] daily --Continue insulin sliding scale for further coverage --CBGs before every meal/at bedtime  Alcohol abuse/cocaine abuse --encourage cessation --thiamine high dose x 3 days   Essential hypertension Continue metoprolol tartrate 25 mg p.o. twice daily loaded pain 10 mg p.o. daily  Hyperlipidemia: Continue atorvastatin 20 g p.o. daily   DVT prophylaxis: Lovenox ordered, but refuses intermittently Code Status: Full code Family Communication: No family present at bedside  Disposition Plan:  Status is: Inpatient  Remains inpatient appropriate because:Altered mental status, Unsafe d/c plan and IV treatments appropriate due to intensity of illness or inability to take PO   Dispo: The patient is from: Home              Anticipated d/c is to: SNF              Anticipated d/c date is: 3 days              Patient currently is not medically stable to d/c.   Consultants:   PCCM  Nephrology  Infectious disease  Procedures:   Intubation 8/10, extubated  01/13/2020  Right IJ nontunneled HD catheter, 8/9  Antimicrobials:   Rifampin  Vancomycin   Subjective: Patient seen and examined bedside, resting comfortably.  Requesting something to drink/eat or even ice chips.  Currently n.p.o. pending fistulogram and need for tunneled HD catheter placement in IR  later this morning.  Also has been refusing glucose checks per nursing this morning.  He has intermittently refused medications and lab draws are the past several days.  No other questions or concerns at this time.  Denies headache, no visual changes, no chest pain, no palpitations, no shortness of breath, no abdominal pain.  No acute events overnight per nursing staff.  Objective: Vitals:   01/19/20 1215 01/19/20 1320 01/19/20 2248 01/20/20 0816  BP: (!) 150/69 (!) 149/68 (!) 145/74 134/70  Pulse: 93 80 78 90  Resp: '16 15 18 17  ' Temp: 99 F (37.2 C) 98.9 F (37.2 C) 98.6 F (37 C) 98.3 F (36.8 C)  TempSrc: Oral Oral Oral Oral  SpO2: 96% 95% 100% 96%  Weight: 88.1 kg     Height:        Intake/Output Summary (Last 24 hours) at 01/20/2020 1403 Last data filed at 01/20/2020 0900 Gross per 24 hour  Intake 370 ml  Output --  Net 370 ml   Filed Weights   01/14/20 0338 01/19/20 0804 01/19/20 1215  Weight: 96.4 kg 91 kg 88.1 kg    Examination:  General exam: Appears calm and comfortable  Respiratory system: Clear to auscultation. Respiratory effort normal. Cardiovascular system: S1 & S2 heard, RRR. No JVD, murmurs, rubs, gallops or clicks. No pedal edema.  Right IJ HD catheter noted Gastrointestinal system: Abdomen is nondistended, soft and nontender. No organomegaly or masses felt. Normal bowel sounds heard. Central nervous system: Alert and oriented. No focal neurological deficits. Extremities: Symmetric 5 x 5 power. Skin: No rashes, lesions or ulcers Psychiatry: Judgement and insight appear poor. Mood & affect appropriate.     Data Reviewed: I have personally reviewed following labs and imaging studies  CBC: Recent Labs  Lab 01/14/20 0330 01/15/20 0410 01/16/20 0338 01/17/20 0349 01/19/20 0332  WBC 27.0* 13.0* 11.1* 10.6* 8.9  HGB 7.5* 7.2* 6.9* 7.7* 7.9*  HCT 22.6* 21.8* 21.1* 23.5* 23.8*  MCV 79.9* 82.0 82.4 81.9 82.6  PLT 334 228 224 220 867   Basic Metabolic  Panel: Recent Labs  Lab 01/16/20 0338 01/17/20 0349 01/18/20 0412 01/19/20 0332 01/20/20 0435  NA 132* 132* 134* 132* 133*  K 4.0 4.5 3.7 4.0 3.6  CL 97* 96* 96* 95* 96*  CO2 24 21* '26 26 26  ' GLUCOSE 151* 120* 168* 93 131*  BUN 55* 65* 31* 42* 23  CREATININE 8.40* 10.71* 6.75* 9.05* 6.83*  CALCIUM 7.5* 7.4* 7.5* 7.4* 7.4*  PHOS 6.2* 7.7* 4.9* 6.3* 4.5   GFR: Estimated Creatinine Clearance: 11.2 mL/min (A) (by C-G formula based on SCr of 6.83 mg/dL (H)). Liver Function Tests: Recent Labs  Lab 01/16/20 0338 01/17/20 0349 01/18/20 0412 01/19/20 0332 01/20/20 0435  AST  --   --   --   --  27  ALT  --   --   --   --  17  ALKPHOS  --   --   --   --  95  BILITOT  --   --   --   --  1.2  PROT  --   --   --   --  7.0  ALBUMIN 1.7* 1.8* 1.7* 1.7* 1.9*  No results for input(s): LIPASE, AMYLASE in the last 168 hours. No results for input(s): AMMONIA in the last 168 hours. Coagulation Profile: No results for input(s): INR, PROTIME in the last 168 hours. Cardiac Enzymes: No results for input(s): CKTOTAL, CKMB, CKMBINDEX, TROPONINI in the last 168 hours. BNP (last 3 results) No results for input(s): PROBNP in the last 8760 hours. HbA1C: No results for input(s): HGBA1C in the last 72 hours. CBG: Recent Labs  Lab 01/18/20 2008 01/19/20 0746 01/19/20 1302 01/19/20 1613 01/20/20 1202  GLUCAP 102* 133* 116* 94 115*   Lipid Profile: No results for input(s): CHOL, HDL, LDLCALC, TRIG, CHOLHDL, LDLDIRECT in the last 72 hours. Thyroid Function Tests: No results for input(s): TSH, T4TOTAL, FREET4, T3FREE, THYROIDAB in the last 72 hours. Anemia Panel: No results for input(s): VITAMINB12, FOLATE, FERRITIN, TIBC, IRON, RETICCTPCT in the last 72 hours. Sepsis Labs: No results for input(s): PROCALCITON, LATICACIDVEN in the last 168 hours.  Recent Results (from the past 240 hour(s))  MRSA PCR Screening     Status: None   Collection Time: 01/10/20  2:20 PM   Specimen:  Nasopharyngeal  Result Value Ref Range Status   MRSA by PCR NEGATIVE NEGATIVE Final    Comment:        The GeneXpert MRSA Assay (FDA approved for NASAL specimens only), is one component of a comprehensive MRSA colonization surveillance program. It is not intended to diagnose MRSA infection nor to guide or monitor treatment for MRSA infections. Performed at [facility], [place] [address], [place], [place] [address]          Radiology Studies: No results found.      Scheduled Meds: . (feeding supplement) PROSource Plus  30 mL Oral BID BM  . amLODipine  10 mg Oral Daily  . atorvastatin  20 mg Oral QHS  . [START ON 01/21/2020] calcitRIOL  1.25 mcg Oral Once per day on Mon Wed Fri  . chlorhexidine gluconate (MEDLINE KIT)  15 mL Mouth Rinse BID  . Chlorhexidine Gluconate Cloth  6 each Topical Q0600  . darbepoetin (ARANESP) injection - DIALYSIS  100 mcg Intravenous Q Mon-HD  . enoxaparin (LOVENOX) injection  30 mg Subcutaneous Q24H  . [START ON 01/21/2020] feeding supplement (NEPRO CARB STEADY)  237 mL Oral QHS  . HYDROcodone-acetaminophen  1 tablet Oral Q6H  . insulin aspart  0-9 Units Subcutaneous Q4H  . insulin detemir  11 Units Subcutaneous BID  . lidocaine  1 patch Transdermal Q24H  . metoprolol tartrate  25 mg Oral BID  . multivitamin  1 tablet Oral QHS  . QUEtiapine  12.5 mg Oral QHS  . rifampin  300 mg Oral Q12H  . sevelamer carbonate  1,600 mg Oral TID WC  . sodium chloride flush  10-40 mL Intracatheter Q12H   Continuous Infusions: . sodium chloride 10 mL/hr at 01/13/20 1700  . vancomycin Stopped (01/19/20 1432)     LOS: 16 days    Time spent: 35 minutes spent on chart review, discussion with nursing staff, consultants, updating family and interview/physical exam; more than 50% of that time was spent in counseling and/or coordination of care.    Lamarion Mcevers J [place] DO [facility] Available via Epic secure chat 7am-7pm After these hours, please  refer to coverage provider listed on [email or web address] 01/20/2020, 2:03 PM

## 2020-01-20 NOTE — Op Note (Signed)
NEUROSURGERY OPERATIVE NOTE   PREOP DIAGNOSIS:  1. Cervical spinal epidural abscess   POSTOP DIAGNOSIS: Same  PROCEDURE: 1. C4-T2 laminectomy for decompression of thecal sac, debridement of epidural abscess 2. Posterior segmental instrumentation, C4-T1 - [employer] Solera 3. Posterolateral arthrodesis, C4-T1  SURGEON: Dr. Consuella Lose, MD  ASSISTANT: Ferne Reus, PA-C  ANESTHESIA: General Endotracheal  EBL: 450cc  SPECIMENS: aerobic/anaerobic culture of paraspinal abscess  DRAINS: None  COMPLICATIONS: None immediate  CONDITION: Hemodynamically stable to PACU  HISTORY: Rodney Veronica Guerrant. is a 69 y.o. male presenting to the emergency department with some confusion, headache and neck pain.  Patient does have a history of end-stage renal disease on dialysis.  Work-up included MRI of the brain and spine which demonstrated relatively large dorsally situated spinal epidural abscess extending from the foramen magnum down to about the T1-2 level.  There did appear to be a significant amount of spinal cord compression from about C3-4 through T1-2.  Treatment options were discussed including recommendation for surgical decompression and stabilization.  The risks, benefits, and alternatives to surgery were all reviewed with both the patient and his family including his daughters.  After all their questions were answered he elected to proceed with surgery.  Informed consent was obtained and witnessed.  PROCEDURE IN DETAIL: The patient was brought to the operating room. After induction of general anesthesia, the patient was positioned on the operative table in the [place] head holder in the prone position. All pressure points were meticulously padded.  Midline skin incision was then marked out and prepped and draped in the usual sterile fashion.  After timeout was conducted, the midline skin incision was infiltrated with local anesthetic with epinephrine.  Incision was then made  sharply and carried down through the subcutaneous tissue until the nuchal fascia was identified and incised.  Subperiosteal dissection was then carried out along the cervical lamina.  There was some purulent material noted within the paraspinal musculature primarily on the left which was cultured.  Towel clip was placed on the spinous process to identify the C3 level.  Dissection was then carried out slightly superior to identify the bottom of the C2 lamina.  Dissection was also carried inferior to identify the top of the T2 lamina.  Dissection was also carried laterally to identify the lateral masses of the cervical vertebrae and the transverse processes of T1.  Self-retaining retractors were then placed.  Initially, pilot holes were drilled and tapped for lateral mass screws at C4, C5, and C6.  This was done using standard anatomic landmarks.  I elected not to instrument C7 in order to make placement of the connecting rod from the cervical to thoracic pedicle screws easier.  At this point we proceeded with decompression.  A small laminotomy was created bilaterally at the bottom of the T1 lamina.  This allowed placement of the high-speed drill with a router and footplate.  This was then used to incise the lamina bilaterally up to the level of C4.  In this way, an en bloc laminectomy from C4-T1 was made and removed.  Kerrison punches were then used to further extend the laminectomy superiorly with about half of C2 lamina removed, and inferiorly so the top of T1 was removed.  I was able to freely pass a ball-tipped dissector within the dorsal epidural space both at C3 and T2.  Kerrison punches were then used to slightly extend the laminectomy laterally in order to achieve good decompression of the thecal sac.  We did note significant  amount of epidural phlegmon just underneath the ligamentum flavum.  Once decompression was completed, hemostasis on the epidural plane was secured primarily with morselized Gelfoam  and thrombin.  We then turned attention to placement of the instrumentation.  With the T1 laminectomy completed, I was able to palpate the pedicles bilaterally.  Using a combination of standard anatomic landmarks and AP fluoroscopy, pilot holes for T1 pedicle screws were placed and tapped.  The high-speed drill was then used to decorticate the lateral masses and facet complexes extending from C3 down to T1 for posterolateral arthrodesis.  The lateral mass screws were then placed bilaterally at C4, C5, C6, and pedicle screws placed bilaterally at T1.  Final AP and lateral fluoroscopic images were taken to confirm good placement of the hardware and good cervical alignment.  I elected not to place any allograft because of the active infection.  The wound was therefore irrigated with copious amounts of normal saline irrigation.  Self-retaining retractors were then removed.  Hemostasis on the muscle edges was secured with bipolar electrocautery.  The wound was then closed in multiple layers using combination of interrupted 0 Vicryl stitches.  Skin was closed with staples.  Sterile dressing with bacitracin was applied.  At the end of the case all sponge, needle, and instrument counts were correct. The patient was then transferred to the stretcher, extubated, and taken to the post-anesthesia care unit in stable hemodynamic condition.

## 2020-01-20 NOTE — Progress Notes (Signed)
Physical Therapy Treatment Patient Details Name: [name]. MRN: [id number] DOB: 02-Feb-1951 Today's Date: 01/20/2020    History of Present Illness [name]. is a 69 y.o. male admitted 01/04/20 secondary to worsening headache and back pain. Found to have epidural abscess with associated cord compression in addition to MRSA positive bacteremia. S/p C4-T2 laminectomy and fusion on 8/5. Transferred to ICU 8/9 due to AMS. Intubated 8/10-8/12 due to worsening mental and respiratory status. Head CT and MRI negative for acute injury. PMH includes ESRD (HD MWF), DM, HTN, anemia, colon CA s/p resection.   PT Comments    Pt slowly progressing with mobility. Remains limited by pain, generalized weakness and cognitive impairment, declining mobility progression beyond EOB despite max encouragement and education on importance of mobility; at high risk for falls. This session, pt perseverating on wanting ice chips, asking multiple times despite education on NPO status. Pt with poor insights into current functional mobility deficits. Continue to recommend SNF-level therapies to maximize functional mobility and independence.     Follow Up Recommendations  SNF;Supervision for mobility/OOB     Equipment Recommendations   (defer)    Recommendations for Other Services       Precautions / Restrictions Precautions Precautions: Fall;Cervical Precaution Comments: Pt declining use of brace despite education Required Braces or Orthoses: Cervical Brace Cervical Brace: Hard collar (when upright or OOB) Restrictions Weight Bearing Restrictions: No    Mobility  Bed Mobility Overal bed mobility: Needs Assistance Bed Mobility: Supine to Sit;Sit to Supine     Supine to sit: Min assist Sit to supine: Min guard   General bed mobility comments: Improved bed mobility, cues to decrease use of BUE support with repositioning to maintain sternal precautions  Transfers                 General  transfer comment: Declined despite max encouragement and educaiton  Ambulation/Gait                 Stairs             Wheelchair Mobility    Modified Rankin (Stroke Patients Only)       Balance Overall balance assessment: Needs assistance Sitting-balance support: Bilateral upper extremity supported;Feet supported Sitting balance-Leahy Scale: Fair Sitting balance - Comments: Slightly improved tolerance to amount of time sitting EOB; still restless with c/o pain, quick to lateral lean on elbow, back to midline, switch to other elbow, all without assist but close min guard due to apparent instability                                    Cognition Arousal/Alertness: Awake/alert Behavior During Therapy: Impulsive;Anxious Overall Cognitive Status: No family/caregiver present to determine baseline cognitive functioning Area of Impairment: Safety/judgement;Following commands;Memory;Attention;Problem solving;Awareness                   Current Attention Level: Sustained Memory: Decreased recall of precautions;Decreased short-term memory Following Commands: Follows one step commands with increased time;Follows one step commands inconsistently Safety/Judgement: Decreased awareness of safety;Decreased awareness of deficits Awareness: Intellectual;Emergent Problem Solving: Slow processing;Difficulty sequencing;Requires verbal cues;Requires tactile cues General Comments: pt with poor insight into his decreased mobility; "I'm going to call someone to pick me up right now" -- asks >5x if can have sip of water/ice chips, educ repeatedly on NPO      Exercises      General Comments General comments (  skin integrity, edema, etc.): Pt refusing use of cervical brace      Pertinent Vitals/Pain Pain Assessment: Faces Faces Pain Scale: Hurts even more Pain Location: Neck, shoulders Pain Descriptors / Indicators: Grimacing;Guarding;Restless Pain  Intervention(s): Monitored during session;Limited activity within patient's tolerance    Home Living                      Prior Function            PT Goals (current goals can now be found in the care plan section) Progress towards PT goals: Not progressing toward goals - comment    Frequency    Min 3X/week      PT Plan Current plan remains appropriate    Co-evaluation              AM-PAC PT "6 Clicks" Mobility   Outcome Measure  Help needed turning from your back to your side while in a flat bed without using bedrails?: A Little Help needed moving from lying on your back to sitting on the side of a flat bed without using bedrails?: A Little Help needed moving to and from a bed to a chair (including a wheelchair)?: A Lot Help needed standing up from a chair using your arms (e.g., wheelchair or bedside chair)?: A Lot Help needed to walk in hospital room?: Total Help needed climbing 3-5 steps with a railing? : Total 6 Click Score: 12    End of Session Equipment Utilized During Treatment: Cervical collar Activity Tolerance: Patient limited by pain Patient left: in bed;with call bell/phone within reach;with bed alarm set Nurse Communication: Mobility status PT Visit Diagnosis: Pain;Muscle weakness (generalized) (M62.81);Difficulty in walking, not elsewhere classified (R26.2)     Time: 8144-8185 PT Time Calculation (min) (ACUTE ONLY): 13 min  Charges:  $Therapeutic Activity: 8-22 mins                    Mabeline Caras, PT, DPT Acute Rehabilitation Services  Pager [phone] Office [place] 01/20/2020, 9:16 AM

## 2020-01-20 NOTE — Progress Notes (Signed)
Pharmacy Antibiotic Note  [name]. is a 69 y.o. male admitted on 01/04/2020 with MRSA cervical discitis with hardware involvement/bacteremia  Pharmacy has been consulted for Vancomycn dosing.  ESRD with HD MWF, last done 8/18 on schedule. Also on Rifampin 600 mg PO daily.  Planning abx thru 03/03/20, 8 weeks.   Checking weekly Vanc trough levels to be sure dosing is on track.  Last Vanc level 19 mcg/ml on 8/16 am > at pre-HD goal of 15-25 mcg/ml.  LFTs normal today, planning weekly while on Rifampin.  Plan:  Continue Vancomycin 750 mg IV MWF after HD.  Weekly Vanc level pre-HD on Mondays. Target 15-25 mcg/ml  Rifampin 600 mg PO daily > weekly cmet on Mondays to monitor LFTs.  Antibiotics thru 03/03/20.  Height: 5' 11.5" (181.6 cm) Weight: 88.1 kg (194 lb 3.6 oz) IBW/kg (Calculated) : 76.45  Temp (24hrs), Avg:98.3 F (36.8 C), Min:97.9 F (36.6 C), Max:98.6 F (37 C)  Recent Labs  Lab 01/14/20 0330 01/14/20 0330 01/15/20 0410 01/15/20 0410 01/16/20 0338 01/17/20 0349 01/18/20 0412 01/19/20 0332 01/20/20 0435  WBC 27.0*  --  13.0*  --  11.1* 10.6*  --  8.9  --   CREATININE 8.35*   < > 5.78*   < > 8.40* 10.71* 6.75* 9.05* 6.83*  VANCORANDOM  --   --   --   --   --  19  --   --   --    < > = values in this interval not displayed.     No Known Allergies  Antimicrobials this admission: Vancomycin 8/4>>(10/1) Rifampin PO 8/9 >> (10/1)  Dose adjustments this admission: 8/6 VR = 23 >> scheduled 1g after dialysis 8/7 scheduled vanc 750mg  qHD MWF  8/10 VR = 22, continue 8/16 VR = 19, continue  Microbiology results: 8/3Blood: 4/4 MRSA 8/3 Urine: negative 8/5 Wound: MRSA 8/6 Blood: negative  Thank you for allowing pharmacy to be a part of this patient's care.  Arty Baumgartner, [place] Phone: [phone] 01/20/2020 5:18 PM

## 2020-01-20 NOTE — Plan of Care (Signed)
  Problem: Education: Goal: Knowledge of General Education information will improve Description Including pain rating scale, medication(s)/side effects and non-pharmacologic comfort measures Outcome: Progressing   

## 2020-01-20 NOTE — Progress Notes (Signed)
Patient agreeable to proceed with tunneled HD catheter placement. He verbalized understanding of what needed to be done but a moment or two later he didn't know who I was or why I was in his room. Patient agreed to let me call one of his daughters to discuss procedure. Telephone consent obtained from patient's daughter Mardene Celeste. Consent is in the IR control room. Procedure will be done either this afternoon or tomorrow 01/21/20.   Soyla Dryer, [place] [phone] 01/20/2020, 12:58 PM

## 2020-01-20 NOTE — Progress Notes (Signed)
[facility] Progress Note   Subjective:   Seen in room - says he is hungry but otherwise without complaints. He is NPO for IR procedure today - conversion temp HD line to [facility] + fistulogram - appreciate IR assistance.  Objective Vitals:   01/19/20 1215 01/19/20 1320 01/19/20 2248 01/20/20 0816  BP: (!) 150/69 (!) 149/68 (!) 145/74 134/70  Pulse: 93 80 78 90  Resp: _0 Temp: 99 F (37.2 C) 98.9 F (37.2 C) 98.6 F (37 C) 98.3 F (36.8 C)  TempSrc: Oral Oral Oral Oral  SpO2: 96% 95% 100% 96%  Weight: 88.1 kg     Height:       Physical Exam General: Well appearing man, NAD. Room air. Heart: RRR; no murmur Lungs: CTA anteriorly Abdomen: soft, non-tender Extremities: no BLE edema Dialysis Access: L AVF + bruit, some edema + prox hematoma. Also with R IJ temp cath  Additional Objective Labs: Basic Metabolic Panel: Recent Labs  Lab 01/18/20 0412 01/19/20 0332 01/20/20 0435  NA 134* 132* 133*  K 3.7 4.0 3.6  CL 96* 95* 96*  CO2 _1 GLUCOSE 168* 93 131*  BUN 31* 42* 23  CREATININE 6.75* 9.05* 6.83*  CALCIUM 7.5* 7.4* 7.4*  PHOS 4.9* 6.3* 4.5   Liver Function Tests: Recent Labs  Lab 01/18/20 0412 01/19/20 0332 01/20/20 0435  AST  --   --  27  ALT  --   --  17  ALKPHOS  --   --  95  BILITOT  --   --  1.2  PROT  --   --  7.0  ALBUMIN 1.7* 1.7* 1.9*   CBC: Recent Labs  Lab 01/14/20 0330 01/14/20 0330 01/15/20 0410 01/15/20 0410 01/16/20 0338 01/17/20 0349 01/19/20 0332  WBC 27.0*   < > 13.0*   < > 11.1* 10.6* 8.9  HGB 7.5*   < > 7.2*   < > 6.9* 7.7* 7.9*  HCT 22.6*   < > 21.8*   < > 21.1* 23.5* 23.8*  MCV 79.9*  --  82.0  --  82.4 81.9 82.6  PLT 334   < > 228   < > 224 220 204   < > = values in this interval not displayed.   Medications: . sodium chloride 10 mL/hr at 01/13/20 1700  . vancomycin Stopped (01/19/20 1432)   . (feeding supplement) PROSource Plus  30 mL Oral BID BM  . amLODipine  10 mg Oral Daily  .  atorvastatin  20 mg Oral QHS  . chlorhexidine gluconate (MEDLINE KIT)  15 mL Mouth Rinse BID  . Chlorhexidine Gluconate Cloth  6 each Topical Q0600  . darbepoetin (ARANESP) injection - DIALYSIS  100 mcg Intravenous Q Mon-HD  . enoxaparin (LOVENOX) injection  30 mg Subcutaneous Q24H  . feeding supplement (NEPRO CARB STEADY)  237 mL Oral BID BM  . HYDROcodone-acetaminophen  1 tablet Oral Q6H  . insulin aspart  0-9 Units Subcutaneous Q4H  . insulin detemir  11 Units Subcutaneous BID  . lidocaine  1 patch Transdermal Q24H  . metoprolol tartrate  25 mg Oral BID  . multivitamin  1 tablet Oral QHS  . QUEtiapine  12.5 mg Oral QHS  . rifampin  300 mg Oral Q12H  . sevelamer carbonate  1,600 mg Oral TID WC  . sodium chloride flush  10-40 mL Intracatheter Q12H   Dialysis Orders: [facility] MWF 4h 85mn 99kg 2/2 bath LUA AVF Hep none -  Calcitriol 1.74mg PO q HD - Mircera 150 mcg IV q 2 weeks (last given on 12/29/19)  Assessment/Plan: 1. 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  2. MRSA bacteremia:Repeat BCx 8/6 negative. TTE 8/6 without vegetation. TDC removed 8/6, using LUE AVF until infiltrated 8/9,wasusing temp HD cath. On IV vanc and PO Rifampin per ID.  3. Resp failure: S/p intubation/vent8/9 -8/12. Now off vent - on room air. 4. Confusion: Likely uremic, as resolved w/ dialysis. Still with some memory issues - ?chronic. 5. ESRD: Continue HD on MWF schedule - next HD 8/20. 6. Infiltrated ARFF:[id number] on 8/16 however able to get it. Had to use AVF 1:1 on 8/18. IR consulted for fistulogram and likely conversion temp line to tunneled. For procedure today. 7. HTN/volume: BP and edema improving. He is down > 10kg from admit. 8. Anemia: Hgb7.9 today. S/p 1U PRBCs 8/15. Aranesp 1041m (^ dose) given 8/16. 9. Secondary  hyperparathyroidism:CorrCa/Phos ok. Continue Renvela 160066mID, resume VDRA. 10. Nutrition: Alb very low - continue protein supplements.  KatVeneta PentonA-C 01/20/2020, 10:09 AM  [employer]

## 2020-01-20 NOTE — Progress Notes (Signed)
Dr. [place] notified about pt refusing blood sugar checks and insulin this morning. Pt currently NPO, waiting on procedure. Will continue to educate pt.

## 2020-01-20 NOTE — Plan of Care (Signed)

## 2020-01-20 NOTE — Progress Notes (Signed)
Nutrition Follow-up  RD working remotely.  DOCUMENTATION CODES:   Non-severe (moderate) malnutrition in context of chronic illness  INTERVENTION:   -Continue renal MVI daily -Decrease Nepro Shake po daily, each supplement provides 425 kcal and 19 grams protein -30 ml Prosource Plus BID, each supplement provides 100 kcals and 15 grams protein  NUTRITION DIAGNOSIS:   Moderate Malnutrition related to chronic illness (ESRD on HD) as evidenced by moderate fat depletion, moderate muscle depletion.  Ongoing  GOAL:   Patient will meet greater than or equal to 90% of their needs  Progressing   MONITOR:   PO intake, Supplement acceptance, Labs, Weight trends, I & O's  REASON FOR ASSESSMENT:   Ventilator, Consult Enteral/tube feeding initiation and management  ASSESSMENT:   69 yo male admitted with cervical epidural abscess with MRSA bacteremia. S/P laminectomy and fusion on 8/5. PMH includes ESRD on HD, HTN, HLD, OSA, DM2, smoker, cocaine and alcohol use.  8/09 - intubated, transferred to ICU 8/12 - extubated, diet advanced to renal/carb modified with 1200 ml fluid restriction  Reviewed I/O's: -3 L x 24 hours and -622 ml x 24 hours  Attempted to speak with pt via call to hospital room phone, however, no answer.   Pt with good oral intake; noted meal completion 50-100%. He has variable acceptance of Nepro supplements.  Per nephrology notes, pt with continues difficulty with cannulation of AVF. Plan for fistulagram and possible tunneled HD catheter placement today. He is currently NPO for procedure.   Per ID notes, plan to 8 weeks of antibiotics for  MRSA cervical discitis with hardware involvement/bacteremia.  Medications reviewed and include aranesp, lovenox, and renvela.   Per TOC notes, plan for SNF placement once stable for discharge.   Labs reviewed: Na: 132, Phos: 6.3, CBGS: 116-133 (inpatient orders for glycemic control are 0-9 units insulin aspart every 4 hours  and 11 units indulin detemir BID).   Diet Order:   Diet Order            Diet NPO time specified Except for: Sips with Meds  Diet effective midnight                 EDUCATION NEEDS:   Not appropriate for education at this time  Skin:  Skin Assessment: Skin Integrity Issues: Skin Integrity Issues:: Incisions Incisions: closed neck  Last BM:  01/17/20  Height:   Ht Readings from Last 1 Encounters:  01/06/20 5' 11.5" (1.816 m)    Weight:   Wt Readings from Last 1 Encounters:  01/19/20 88.1 kg    Ideal Body Weight:  79.5 kg  BMI:  Body mass index is 26.71 kg/m.  Estimated Nutritional Needs:   Kcal:  2200-2400  Protein:  115-150 grams  Fluid:  1 L + UOP    Evelyna Folker W, RD, LDN, CDCES Registered Dietitian II Certified Diabetes Care and Education Specialist Please refer to AMION for RD and/or RD on-call/weekend/after hours pager

## 2020-01-21 ENCOUNTER — Inpatient Hospital Stay (HOSPITAL_COMMUNITY): Payer: No Typology Code available for payment source

## 2020-01-21 HISTORY — PX: IR DIALY SHUNT INTRO NEEDLE/INTRACATH INITIAL W/IMG LEFT: IMG6102

## 2020-01-21 HISTORY — PX: IR US GUIDE VASC ACCESS LEFT: IMG2389

## 2020-01-21 HISTORY — PX: IR FLUORO GUIDE CV LINE RIGHT: IMG2283

## 2020-01-21 HISTORY — PX: IR US GUIDE VASC ACCESS RIGHT: IMG2390

## 2020-01-21 LAB — COMPREHENSIVE METABOLIC PANEL
ALT: 21 U/L (ref 0–44)
AST: 35 U/L (ref 15–41)
Albumin: 1.8 g/dL — ABNORMAL LOW (ref 3.5–5.0)
Alkaline Phosphatase: 102 U/L (ref 38–126)
Anion gap: 12 (ref 5–15)
BUN: 35 mg/dL — ABNORMAL HIGH (ref 8–23)
CO2: 25 mmol/L (ref 22–32)
Calcium: 7.5 mg/dL — ABNORMAL LOW (ref 8.9–10.3)
Chloride: 98 mmol/L (ref 98–111)
Creatinine, Ser: 9.27 mg/dL — ABNORMAL HIGH (ref 0.61–1.24)
GFR calc Af Amer: 6 mL/min — ABNORMAL LOW (ref >60)
GFR calc non Af Amer: 5 mL/min — ABNORMAL LOW (ref >60)
Glucose, Bld: 140 mg/dL — ABNORMAL HIGH (ref 70–99)
Potassium: 4 mmol/L (ref 3.5–5.1)
Sodium: 135 mmol/L (ref 135–145)
Total Bilirubin: 0.8 mg/dL (ref 0.3–1.2)
Total Protein: 6.9 g/dL (ref 6.5–8.1)

## 2020-01-21 LAB — CBC
HCT: 21.8 % — ABNORMAL LOW (ref 39.0–52.0)
Hemoglobin: 7.1 g/dL — ABNORMAL LOW (ref 13.0–17.0)
MCH: 27.2 pg (ref 26.0–34.0)
MCHC: 32.6 g/dL (ref 30.0–36.0)
MCV: 83.5 fL (ref 80.0–100.0)
Platelets: 250 10*3/uL (ref 150–400)
RBC: 2.61 MIL/uL — ABNORMAL LOW (ref 4.22–5.81)
RDW: 16.7 % — ABNORMAL HIGH (ref 11.5–15.5)
WBC: 11 10*3/uL — ABNORMAL HIGH (ref 4.0–10.5)
nRBC: 0 % (ref 0.0–0.2)

## 2020-01-21 LAB — GLUCOSE, CAPILLARY
Glucose-Capillary: 109 mg/dL — ABNORMAL HIGH (ref 70–99)
Glucose-Capillary: 117 mg/dL — ABNORMAL HIGH (ref 70–99)
Glucose-Capillary: 125 mg/dL — ABNORMAL HIGH (ref 70–99)
Glucose-Capillary: 131 mg/dL — ABNORMAL HIGH (ref 70–99)
Glucose-Capillary: 133 mg/dL — ABNORMAL HIGH (ref 70–99)
Glucose-Capillary: 145 mg/dL — ABNORMAL HIGH (ref 70–99)

## 2020-01-21 LAB — SEDIMENTATION RATE: Sed Rate: 126 mm/hr — ABNORMAL HIGH (ref 0–16)

## 2020-01-21 LAB — PHOSPHORUS: Phosphorus: 6 mg/dL — ABNORMAL HIGH (ref 2.5–4.6)

## 2020-01-21 LAB — C-REACTIVE PROTEIN: CRP: 12.8 mg/dL — ABNORMAL HIGH (ref <1.0)

## 2020-01-21 IMAGING — XA IR FLUORO GUIDE CV LINE*R*
5 series · 14 of 24 positions shown · IV contrast (IODINE)
Comparison: none

INDICATION: 68-year-old male with end-stage renal disease on hemodialysis via a
left upper extremity brachiocephalic arteriovenous fistula. He
recently had an extravasation event with hematoma formation making
it difficult to access the left upper extremity fistula. He
currently has a temporary IJ hemodialysis catheter on the right but
needs a more durable access in till his fistula can again be used.

[Series 1: ir fluoro guide cv line*right* · 1 of 3 slices shown]
[im 1/3]
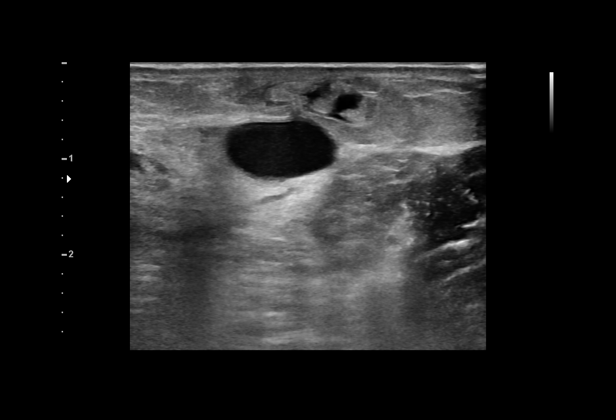

[Series 1: body 4 care · 4 of 6 slices shown (1 of 3)]
[im 1/6]
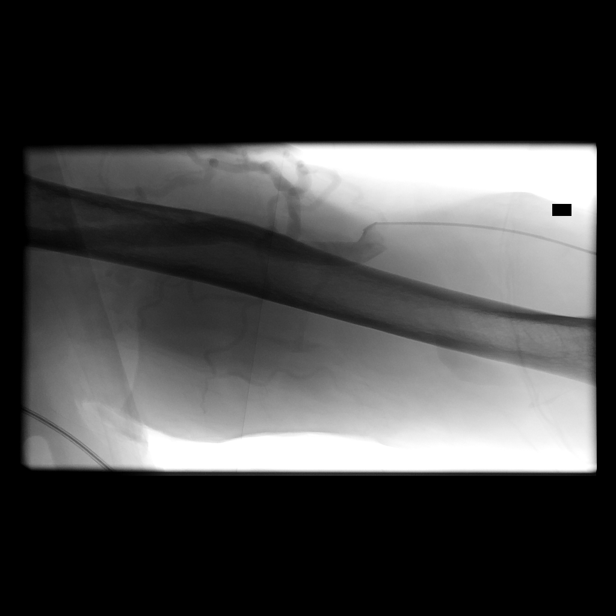
[im 3/6]
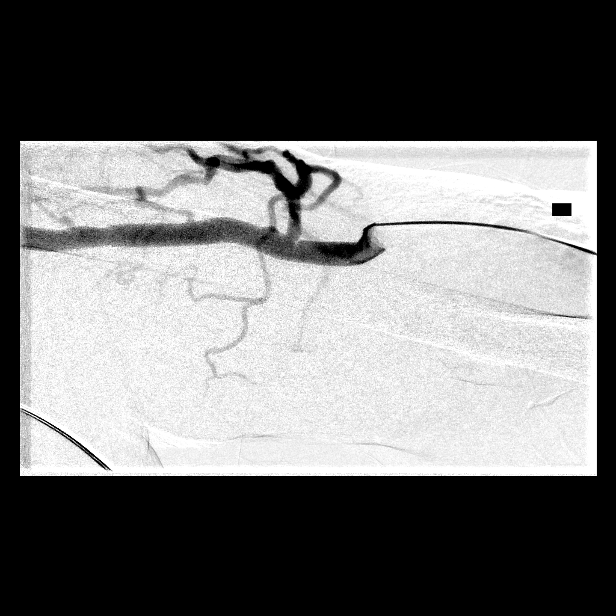
[im 5/6]
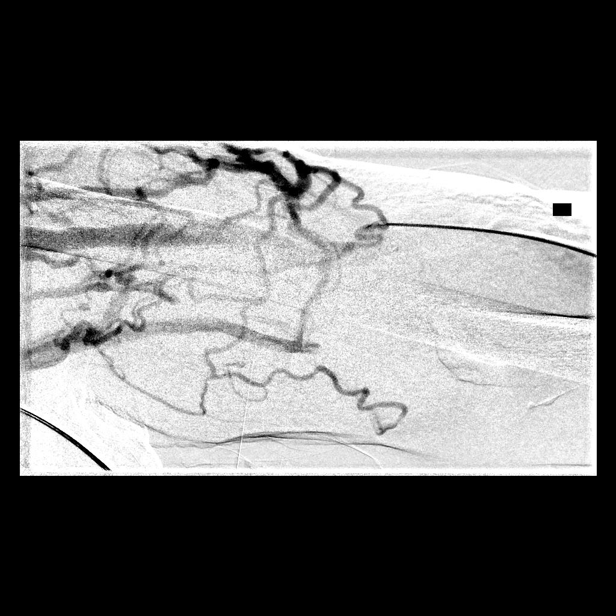
[im 6/6]
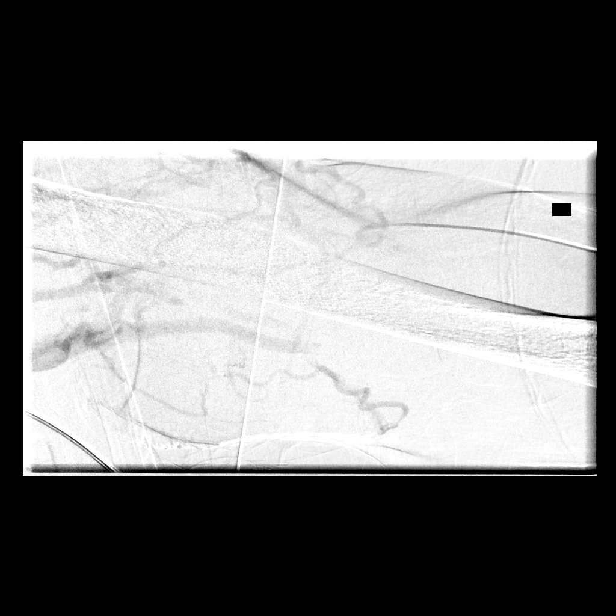

[Series 2: body 4 care · 4 of 7 slices shown (2 of 3)]
[im 2/7]
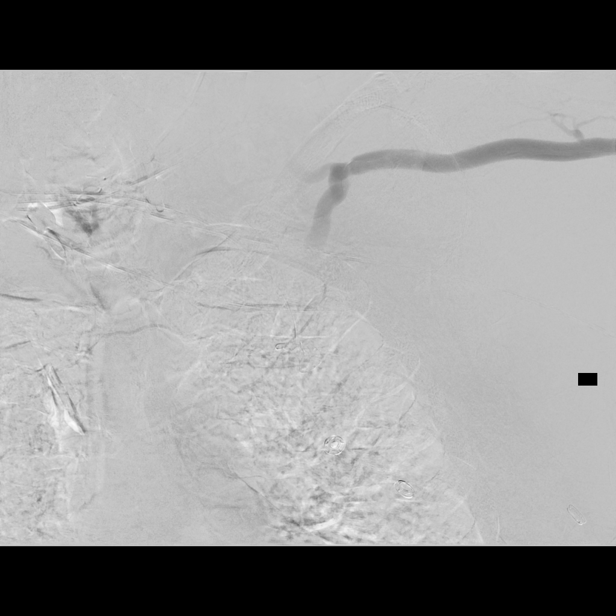
[im 4/7]
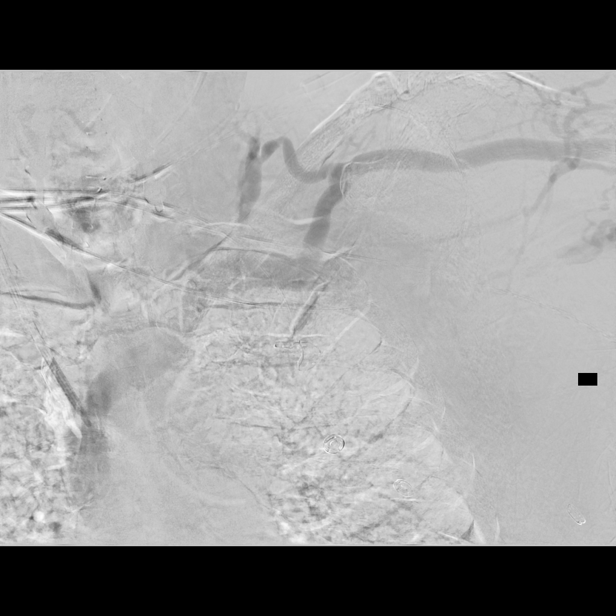
[im 5/7]
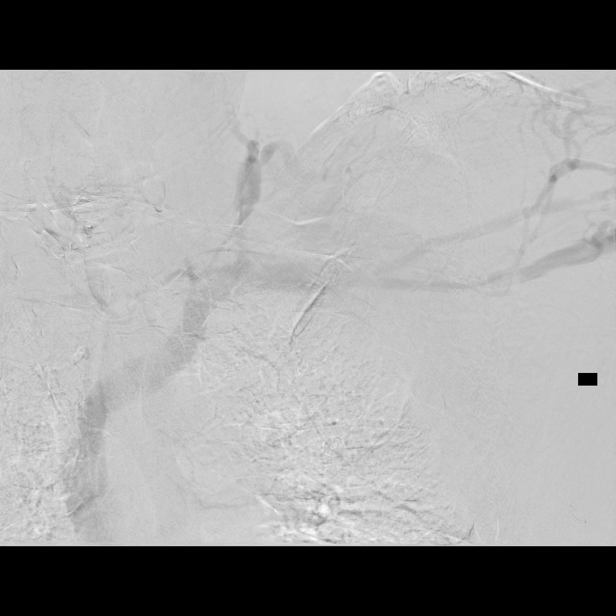
[im 7/7]
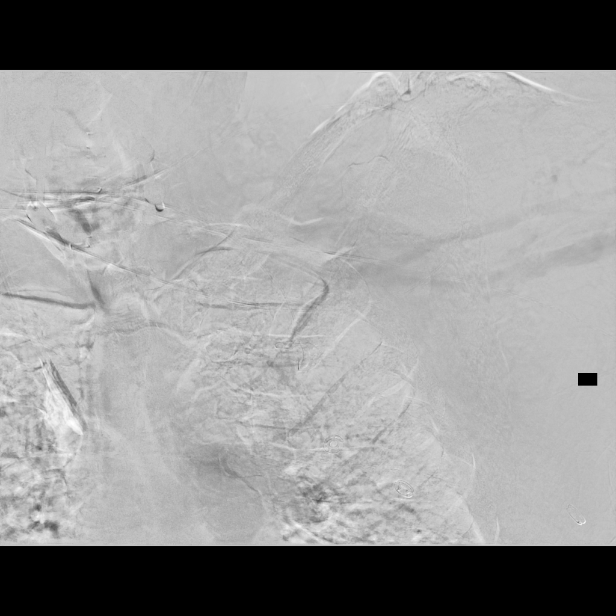

[Series 6: body 4 care · 4 of 8 slices shown (3 of 3)]
[im 2/8]
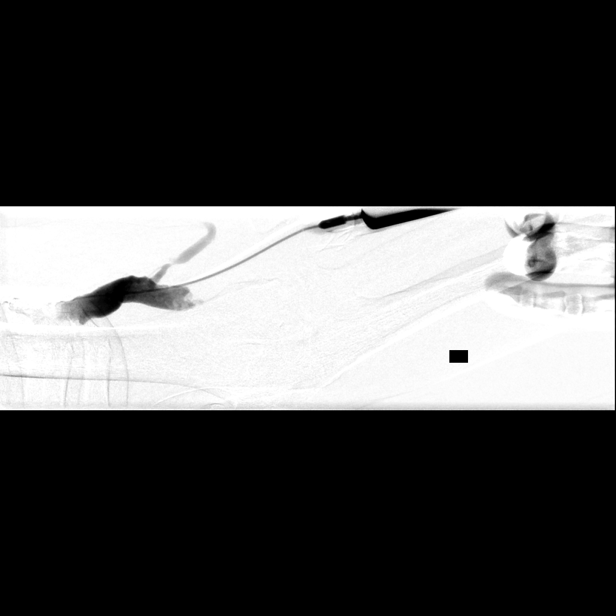
[im 4/8]
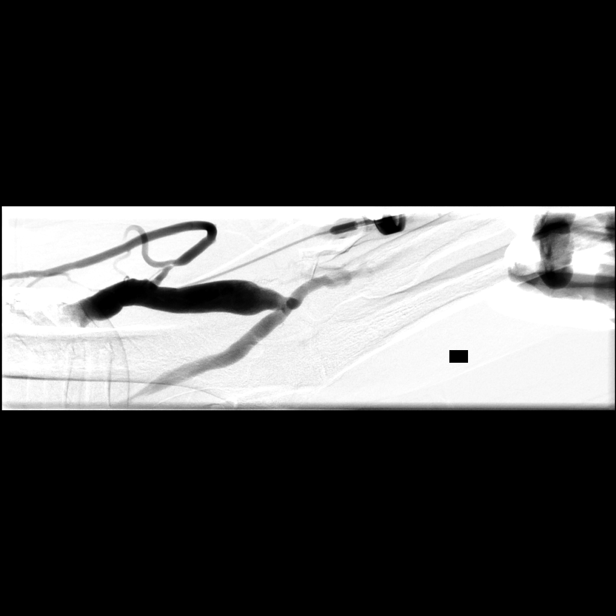
[im 5/8]
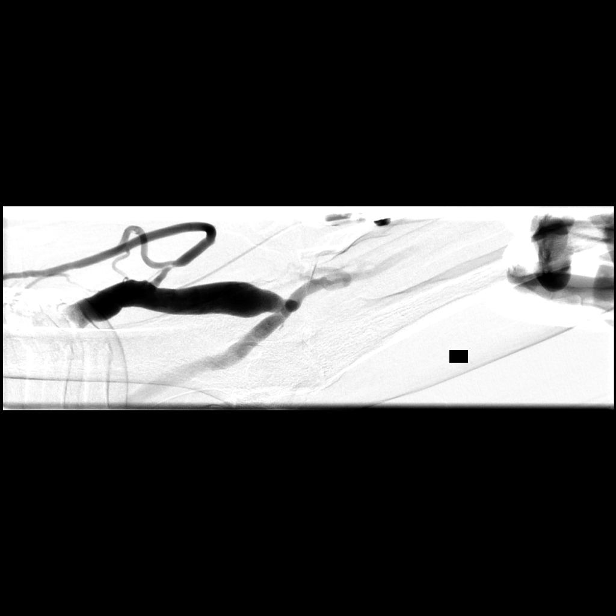
[im 7/8]
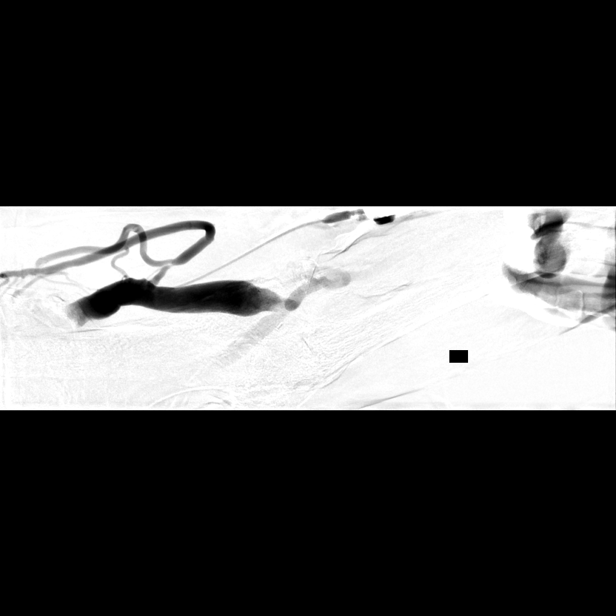

[Series 7: fl (-) angio · 1 of 1 slices shown]
[im 1/1]
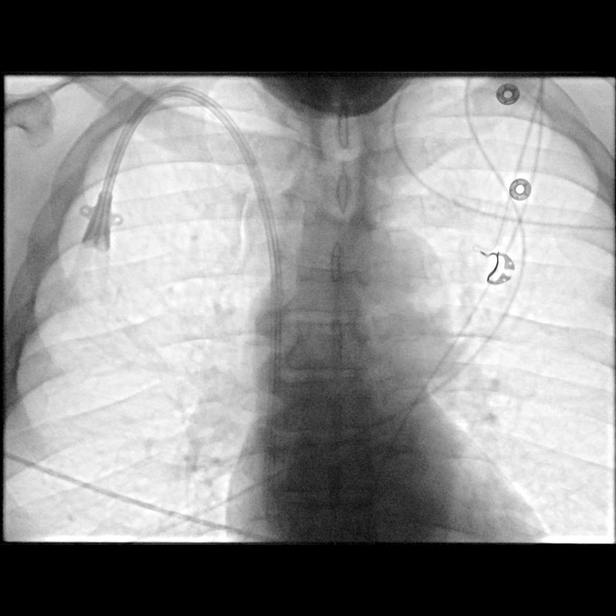

[14 of 24 positions shown; findings below may reference images not displayed]

EXAM:
TUNNELED CENTRAL VENOUS HEMODIALYSIS CATHETER PLACEMENT WITH
ULTRASOUND AND FLUOROSCOPIC GUIDANCE

MEDICATIONS:
2 g Ancef. The antibiotic was given in an appropriate time interval
prior to skin puncture.

ANESTHESIA/SEDATION:
None.

FLUOROSCOPY TIME:  Fluoroscopy Time: 1 minutes 18 seconds (13 mGy).

COMPLICATIONS:
None immediate.



After creating a small venotomy incision, a micropuncture kit was
utilized to access the right internal jugular vein under direct,
real-time ultrasound guidance after the overlying soft tissues were
anesthetized with 1% lidocaine with epinephrine. Ultrasound image
documentation was performed. The microwire was kinked to measure
appropriate catheter length. A stiff Glidewire was advanced to the
level of the IVC and the micropuncture sheath was exchanged for a
peel-away sheath. A palindrome tunneled hemodialysis catheter
measuring 23 cm from tip to cuff was tunneled in a retrograde
fashion from the anterior chest wall to the venotomy incision.

The catheter was then placed through the peel-away sheath with tips
ultimately positioned within the superior aspect of the right
atrium. Final catheter positioning was confirmed and documented with
a spot radiographic image. The catheter aspirates and flushes
normally. The catheter was flushed with appropriate volume heparin
dwells.

The catheter exit site was secured with a 0-Prolene retention
suture. The venotomy incision was closed with Dermabond. Dressings
were applied. The patient tolerated the procedure well without
immediate post procedural complication.
IMPRESSION: Successful placement of 23 cm tip to cuff tunneled hemodialysis
catheter via the right internal jugular vein with tips terminating
within the superior aspect of the right atrium. The catheter is
ready for immediate use.

The previously placed temporary right IJ catheter was removed.

## 2020-01-21 MED ORDER — CEFAZOLIN (ANCEF) 1 G IV SOLR
INTRAVENOUS | Status: AC | PRN
  Administered 2020-01-21: 2 g

## 2020-01-21 MED ORDER — LIDOCAINE HCL 1 % IJ SOLN
INTRAMUSCULAR | Status: AC
  Filled 2020-01-21: qty 20

## 2020-01-21 MED ORDER — HEPARIN SODIUM (PORCINE) 1000 UNIT/ML IJ SOLN
INTRAMUSCULAR | Status: AC
  Administered 2020-01-21: 3800 [IU]
  Filled 2020-01-21: qty 1

## 2020-01-21 MED ORDER — GELATIN ABSORBABLE 12-7 MM EX MISC
CUTANEOUS | Status: AC
  Filled 2020-01-21: qty 1

## 2020-01-21 MED ORDER — IOHEXOL 300 MG/ML  SOLN
100.0000 mL | Freq: Once | INTRAMUSCULAR | Status: AC | PRN
  Administered 2020-01-21: 20 mL via INTRAVENOUS

## 2020-01-21 MED ORDER — MIDAZOLAM HCL 2 MG/2ML IJ SOLN
INTRAMUSCULAR | Status: AC
  Filled 2020-01-21: qty 2

## 2020-01-21 MED ORDER — FENTANYL CITRATE (PF) 100 MCG/2ML IJ SOLN
INTRAMUSCULAR | Status: AC
  Filled 2020-01-21: qty 2

## 2020-01-21 MED ORDER — CEFAZOLIN SODIUM-DEXTROSE 2-4 GM/100ML-% IV SOLN
INTRAVENOUS | Status: AC
  Filled 2020-01-21: qty 100

## 2020-01-21 MED ORDER — LIDOCAINE HCL 1 % IJ SOLN
INTRAMUSCULAR | Status: AC | PRN
  Administered 2020-01-21: 10 mL

## 2020-01-21 NOTE — Progress Notes (Signed)
PROGRESS NOTE    [name].  QHU:[id number] DOB: [birth year] DOA: 01/04/2020 PCP: [facility]    Brief Narrative:  Rodney Cromwell. is an 69 y.o. male  veteran withhistory of ESRD on hemodialysis on Monday Wednesday Friday, hypertension, diabetes mellitus type 2, anemia history of colon cancer status post resection has been having increasing headache back pain and found to have epidural abscess and is s/p laminectomy and MRSA septicemia.  Required intubation due to AMS-- continues to have issues with short term memory and sundowning.   Assessment & Plan:   Principal Problem:   MRSA bacteremia Active Problems:   TOBACCO ABUSE   OBSTRUCTIVE SLEEP APNEA   Colon cancer, status Post resection    HYPERTENSION   ESRD (end stage renal disease) ([place])   Epidural abscess   Acute encephalopathy   Sepsis ([place])   Bacteremia associated with intravascular line ([place])   Hemodialysis patient ([place])   Acute hypoxic respiratory failure with compromised airwaysecondary to acute encephalopathy Patient was briefly intubated requiring ventilatory support on 01/11/2020 and successfully extubated on 01/13/2020.  Oxygenating well on room air.  Acute metabolic toxic encephalopathy: short term memory seems to be impaired and I have concerns about his ability to make his own medical decisions currently CT Head and MRI brain negative for acute intracranial abnormalities; Suspect to be secondary to sepsis vs substance abuse and doubt uremia currently.He had HD sessions per MWF schedule with improvement in mental status. --Continue seroquel Qhs for sundowning --Continue high-dose thiamine  MRSA bacteremia  Epidural abscess s/p laminectomy and fusion: --WBC 25>>>8.9 --ESR 115> 126 --CRP 17.9> 12.8 --Continued on vancomycin q M/W/F w/ HD and rifampin 373m PO BID x 8 weeks total per ID; through October 1 per ID --Will need weekly LFTs on rifampin; weekly ESR/CRP --Vanco level per  pharmacy --Outpatient appointment with ID on September 2 at 11 AM with Rodney Madeira NP  ESRD on HD: Patient continues to have issues regarding his AV fistula requiring use of a temporary right IJ catheter placed by PCCM on 01/10/2020.  Underwent left upper extremity fistulogram with no significant stenosis or occlusion with superficial subcutaneous hematoma in the upper arm overlying portion of the access which could result in temporary difficult cannulation.  IR also placed a tunneled HD catheter on 01/21/2020 --Continue HD per nephrology  Anemia of chronic disease: No signs of active bleeding.Suspect secondary to acute illness and anemia of chronic disease.  Received 1 unit PRBC on 01/16/2020. --Hgb 7.9>7.1; continue transfusions as needed with HD --aranesp per nephrology  Type 2 DM with hyperglycemia Hemoglobin A1c 6.5 on 01/12/2020, well controlled --Levemir 11u [place] daily --Continue insulin sliding scale for further coverage --CBGs before every meal/at bedtime --Has been intermittently refusing medications and lab testing/CBG checks  Alcohol abuse/cocaine abuse --encourage cessation --thiamine high dose x 3 days   Essential hypertension Continue metoprolol tartrate 25 mg p.o. twice daily loaded pain 10 mg p.o. daily  Hyperlipidemia: Continue atorvastatin 20 g p.o. daily   DVT prophylaxis: Lovenox ordered, but refuses intermittently Code Status: Full code Family Communication: No family present at bedside  Disposition Plan:  Status is: Inpatient  Remains inpatient appropriate because:Altered mental status, Unsafe d/c plan and IV treatments appropriate due to intensity of illness or inability to take PO   Dispo: The patient is from: Home              Anticipated d/c is to: SNF  Anticipated d/c date is: 2 days, plan discharge to [facility] on 01/23/2020              Patient currently is not medically stable to d/c.   Consultants:    PCCM  Nephrology  Infectious disease  Procedures:   Intubation 8/10, extubated 01/13/2020  Right IJ nontunneled HD catheter, 8/9 - 8/20  Right IJ tunneled HD catheter, 8/20  IR fistulogram, 8/20  Antimicrobials:   Rifampin  Vancomycin   Subjective: Patient seen and examined bedside, resting comfortably.  Just returned from IR after fistulogram and tunneled HD catheter placement.  Requesting lunch.  Continues to refuse medications and lab checks intermittently.  No other complaints or concerns at this time. Denies headache, no visual changes, no chest pain, no palpitations, no shortness of breath, no abdominal pain.  Nursing concerned about his demeanor and refusal of medical therapy, otherwise no acute events overnight per nursing staff.  Objective: Vitals:   01/21/20 1010 01/21/20 1035 01/21/20 1045 01/21/20 1329  BP: (!) 154/75 (!) 149/71 (!) 157/69 (!) 156/66  Pulse:  80 79 79  Resp: (!) '21  20 18  ' Temp:    97.8 F (36.6 C)  TempSrc:    Oral  SpO2: 100% 97% 100% 100%  Weight:      Height:       No intake or output data in the 24 hours ending 01/21/20 1438 Filed Weights   01/14/20 0338 01/19/20 0804 01/19/20 1215  Weight: 96.4 kg 91 kg 88.1 kg    Examination:  General exam: Appears calm and comfortable, irritable Respiratory system: Clear to auscultation. Respiratory effort normal. Cardiovascular system: S1 & S2 heard, RRR. No JVD, murmurs, rubs, gallops or clicks. No pedal edema.  Right IJ HD catheter noted Gastrointestinal system: Abdomen is nondistended, soft and nontender. No organomegaly or masses felt. Normal bowel sounds heard. Central nervous system: Alert and oriented. No focal neurological deficits. Extremities: Symmetric 5 x 5 power. Skin: No rashes, lesions or ulcers Psychiatry: Judgement and insight appear poor. Mood & affect appropriate.     Data Reviewed: I have personally reviewed following labs and imaging studies  CBC: Recent Labs   Lab 01/15/20 0410 01/16/20 0338 01/17/20 0349 01/19/20 0332 01/21/20 0401  WBC 13.0* 11.1* 10.6* 8.9 11.0*  HGB 7.2* 6.9* 7.7* 7.9* 7.1*  HCT 21.8* 21.1* 23.5* 23.8* 21.8*  MCV 82.0 82.4 81.9 82.6 83.5  PLT 228 224 220 204 034   Basic Metabolic Panel: Recent Labs  Lab 01/17/20 0349 01/18/20 0412 01/19/20 0332 01/20/20 0435 01/21/20 0401  NA 132* 134* 132* 133* 135  K 4.5 3.7 4.0 3.6 4.0  CL 96* 96* 95* 96* 98  CO2 21* '26 26 26 25  ' GLUCOSE 120* 168* 93 131* 140*  BUN 65* 31* 42* 23 35*  CREATININE 10.71* 6.75* 9.05* 6.83* 9.27*  CALCIUM 7.4* 7.5* 7.4* 7.4* 7.5*  PHOS 7.7* 4.9* 6.3* 4.5 6.0*   GFR: Estimated Creatinine Clearance: 8.3 mL/min (A) (by C-G formula based on SCr of 9.27 mg/dL (H)). Liver Function Tests: Recent Labs  Lab 01/17/20 0349 01/18/20 0412 01/19/20 0332 01/20/20 0435 01/21/20 0401  AST  --   --   --  27 35  ALT  --   --   --  17 21  ALKPHOS  --   --   --  95 102  BILITOT  --   --   --  1.2 0.8  PROT  --   --   --  7.0 6.9  ALBUMIN 1.8* 1.7* 1.7* 1.9* 1.8*   No results for input(s): LIPASE, AMYLASE in the last 168 hours. No results for input(s): AMMONIA in the last 168 hours. Coagulation Profile: No results for input(s): INR, PROTIME in the last 168 hours. Cardiac Enzymes: No results for input(s): CKTOTAL, CKMB, CKMBINDEX, TROPONINI in the last 168 hours. BNP (last 3 results) No results for input(s): PROBNP in the last 8760 hours. HbA1C: No results for input(s): HGBA1C in the last 72 hours. CBG: Recent Labs  Lab 01/20/20 1202 01/20/20 1610 01/21/20 0417 01/21/20 0845 01/21/20 1215  GLUCAP 115* 95 131* 117* 125*   Lipid Profile: No results for input(s): CHOL, HDL, LDLCALC, TRIG, CHOLHDL, LDLDIRECT in the last 72 hours. Thyroid Function Tests: No results for input(s): TSH, T4TOTAL, FREET4, T3FREE, THYROIDAB in the last 72 hours. Anemia Panel: No results for input(s): VITAMINB12, FOLATE, FERRITIN, TIBC, IRON, RETICCTPCT in the  last 72 hours. Sepsis Labs: No results for input(s): PROCALCITON, LATICACIDVEN in the last 168 hours.  No results found for this or any previous visit (from the past 240 hour(s)).       Radiology Studies: IR Fluoro Guide CV Line Right  Result Date: 01/21/2020 INDICATION: 69 year old male with end-stage renal disease on hemodialysis via a left upper extremity brachiocephalic arteriovenous fistula. He recently had an extravasation event with hematoma formation making it difficult to access the left upper extremity fistula. He currently has a temporary IJ hemodialysis catheter on the right but needs a more durable access in till his fistula can again be used. EXAM: TUNNELED CENTRAL VENOUS HEMODIALYSIS CATHETER PLACEMENT WITH ULTRASOUND AND FLUOROSCOPIC GUIDANCE MEDICATIONS: 2 g Ancef. The antibiotic was given in an appropriate time interval prior to skin puncture. ANESTHESIA/SEDATION: None. FLUOROSCOPY TIME:  Fluoroscopy Time: 1 minutes 18 seconds (13 mGy). COMPLICATIONS: None immediate. PROCEDURE: Informed written consent was obtained from the patient after a discussion of the risks, benefits, and alternatives to treatment. Questions regarding the procedure were encouraged and answered. The right neck and chest were prepped with chlorhexidine in a sterile fashion, and a sterile drape was applied covering the operative field. Maximum barrier sterile technique with sterile gowns and gloves were used for the procedure. A timeout was performed prior to the initiation of the procedure. After creating a small venotomy incision, a micropuncture kit was utilized to access the right internal jugular vein under direct, real-time ultrasound guidance after the overlying soft tissues were anesthetized with 1% lidocaine with epinephrine. Ultrasound image documentation was performed. The microwire was kinked to measure appropriate catheter length. A stiff Glidewire was advanced to the level of the IVC and the  micropuncture sheath was exchanged for a peel-away sheath. A palindrome tunneled hemodialysis catheter measuring 23 cm from tip to cuff was tunneled in a retrograde fashion from the anterior chest wall to the venotomy incision. The catheter was then placed through the peel-away sheath with tips ultimately positioned within the superior aspect of the right atrium. Final catheter positioning was confirmed and documented with a spot radiographic image. The catheter aspirates and flushes normally. The catheter was flushed with appropriate volume heparin dwells. The catheter exit site was secured with a 0-Prolene retention suture. The venotomy incision was closed with Dermabond. Dressings were applied. The patient tolerated the procedure well without immediate post procedural complication. IMPRESSION: Successful placement of 23 cm tip to cuff tunneled hemodialysis catheter via the right internal jugular vein with tips terminating within the superior aspect of the right atrium. The catheter is ready  for immediate use. The previously placed temporary right IJ catheter was removed. Electronically Signed   By: Jacqulynn Cadet M.D.   On: 01/21/2020 11:58   IR US Guide Vasc Access Left  Result Date: 01/21/2020 INDICATION: 69 year old male with end-stage renal disease on hemodialysis via a left upper extremity brachiocephalic arteriovenous fistula. He recently had an extravasation event with hematoma formation making it difficult to access the left upper extremity fistula. He currently has a temporary IJ hemodialysis catheter on the right but needs a more durable access in till his fistula can again be used. EXAM: TUNNELED CENTRAL VENOUS HEMODIALYSIS CATHETER PLACEMENT WITH ULTRASOUND AND FLUOROSCOPIC GUIDANCE MEDICATIONS: 2 g Ancef. The antibiotic was given in an appropriate time interval prior to skin puncture. ANESTHESIA/SEDATION: None. FLUOROSCOPY TIME:  Fluoroscopy Time: 1 minutes 18 seconds (13 mGy). COMPLICATIONS:  None immediate. PROCEDURE: Informed written consent was obtained from the patient after a discussion of the risks, benefits, and alternatives to treatment. Questions regarding the procedure were encouraged and answered. The right neck and chest were prepped with chlorhexidine in a sterile fashion, and a sterile drape was applied covering the operative field. Maximum barrier sterile technique with sterile gowns and gloves were used for the procedure. A timeout was performed prior to the initiation of the procedure. After creating a small venotomy incision, a micropuncture kit was utilized to access the right internal jugular vein under direct, real-time ultrasound guidance after the overlying soft tissues were anesthetized with 1% lidocaine with epinephrine. Ultrasound image documentation was performed. The microwire was kinked to measure appropriate catheter length. A stiff Glidewire was advanced to the level of the IVC and the micropuncture sheath was exchanged for a peel-away sheath. A palindrome tunneled hemodialysis catheter measuring 23 cm from tip to cuff was tunneled in a retrograde fashion from the anterior chest wall to the venotomy incision. The catheter was then placed through the peel-away sheath with tips ultimately positioned within the superior aspect of the right atrium. Final catheter positioning was confirmed and documented with a spot radiographic image. The catheter aspirates and flushes normally. The catheter was flushed with appropriate volume heparin dwells. The catheter exit site was secured with a 0-Prolene retention suture. The venotomy incision was closed with Dermabond. Dressings were applied. The patient tolerated the procedure well without immediate post procedural complication. IMPRESSION: Successful placement of 23 cm tip to cuff tunneled hemodialysis catheter via the right internal jugular vein with tips terminating within the superior aspect of the right atrium. The catheter is  ready for immediate use. The previously placed temporary right IJ catheter was removed. Electronically Signed   By: Jacqulynn Cadet M.D.   On: 01/21/2020 11:58   IR US Guide Vasc Access Right  Result Date: 01/21/2020 INDICATION: 69 year old male with end-stage renal disease. He was dialyzing via a left upper extremity arteriovenous fistula but suffered in extravasation event resulting in hematoma formation in the arm and is now dialyzing via a temporary IJ catheter on the right. EXAM: Fistulagram MEDICATIONS: None. ANESTHESIA/SEDATION: None FLUOROSCOPY TIME:  Fluoroscopy Time: 1 minutes 18 seconds (13 mGy). COMPLICATIONS: None immediate. PROCEDURE: Informed written consent was obtained from the patient after a thorough discussion of the procedural risks, benefits and alternatives. All questions were addressed. Maximal Sterile Barrier Technique was utilized including caps, mask, sterile gowns, sterile gloves, sterile drape, hand hygiene and skin antiseptic. A timeout was performed prior to the initiation of the procedure. The left upper extremity AV fistula was interrogated with ultrasound and found to be  widely patent. An image was obtained and stored for the medical record. Local anesthesia was attained by infiltration with 1% lidocaine. A small dermatotomy was made. Under real-time sonographic guidance, the vessel was punctured with a 21 gauge micropuncture needle. Using standard technique, the initial micro needle was exchanged over a 0.018 micro wire for a transitional 4 [place] micro sheath. A diagnostic fistulagram was then performed. This is a left upper extremity brachiocephalic arteriovenous fistula. There are multiple side branches with some cross-filling between the cephalic vein and the deeper brachial basilic system. Mild tortuosity present at the cephalic arch. However, there is not appear to be a hemodynamically significant stenosis or evidence of clot. The central veins are widely patent. The  arterial anastomosis also appears widely patent. Normal palpable thrill. Sonographic evaluation of the upper extremity does demonstrate a moderately large subcutaneous hematoma overlying a portion of the access zone. IMPRESSION: 1. Left upper extremity brachiocephalic arteriovenous fistula without evidence of significant stenosis or occlusion. 2. There are multiple small side branches but these do not appear to limit flow or function. 3. Superficial subcutaneous hematoma in the upper arm overlying a portion of the access zone may result in temporarily difficult cannulation. ACCESS: This access remains amenable to future percutaneous interventions as clinically indicated. Electronically Signed   By: Jacqulynn Cadet M.D.   On: 01/21/2020 13:52   IR DIALY SHUNT INTRO NEEDLE/INTRACATH INITIAL W/IMG LEFT  Result Date: 01/21/2020 INDICATION: 69 year old male with end-stage renal disease. He was dialyzing via a left upper extremity arteriovenous fistula but suffered in extravasation event resulting in hematoma formation in the arm and is now dialyzing via a temporary IJ catheter on the right. EXAM: Fistulagram MEDICATIONS: None. ANESTHESIA/SEDATION: None FLUOROSCOPY TIME:  Fluoroscopy Time: 1 minutes 18 seconds (13 mGy). COMPLICATIONS: None immediate. PROCEDURE: Informed written consent was obtained from the patient after a thorough discussion of the procedural risks, benefits and alternatives. All questions were addressed. Maximal Sterile Barrier Technique was utilized including caps, mask, sterile gowns, sterile gloves, sterile drape, hand hygiene and skin antiseptic. A timeout was performed prior to the initiation of the procedure. The left upper extremity AV fistula was interrogated with ultrasound and found to be widely patent. An image was obtained and stored for the medical record. Local anesthesia was attained by infiltration with 1% lidocaine. A small dermatotomy was made. Under real-time sonographic  guidance, the vessel was punctured with a 21 gauge micropuncture needle. Using standard technique, the initial micro needle was exchanged over a 0.018 micro wire for a transitional 4 [place] micro sheath. A diagnostic fistulagram was then performed. This is a left upper extremity brachiocephalic arteriovenous fistula. There are multiple side branches with some cross-filling between the cephalic vein and the deeper brachial basilic system. Mild tortuosity present at the cephalic arch. However, there is not appear to be a hemodynamically significant stenosis or evidence of clot. The central veins are widely patent. The arterial anastomosis also appears widely patent. Normal palpable thrill. Sonographic evaluation of the upper extremity does demonstrate a moderately large subcutaneous hematoma overlying a portion of the access zone. IMPRESSION: 1. Left upper extremity brachiocephalic arteriovenous fistula without evidence of significant stenosis or occlusion. 2. There are multiple small side branches but these do not appear to limit flow or function. 3. Superficial subcutaneous hematoma in the upper arm overlying a portion of the access zone may result in temporarily difficult cannulation. ACCESS: This access remains amenable to future percutaneous interventions as clinically indicated. Electronically Signed   By: Myrle Sheng  Laurence Ferrari M.D.   On: 01/21/2020 13:52        Scheduled Meds: . (feeding supplement) PROSource Plus  30 mL Oral BID BM  . amLODipine  10 mg Oral Daily  . atorvastatin  20 mg Oral QHS  . calcitRIOL  1.25 mcg Oral Once per day on Mon Wed Fri  . chlorhexidine gluconate (MEDLINE KIT)  15 mL Mouth Rinse BID  . Chlorhexidine Gluconate Cloth  6 each Topical Q0600  . darbepoetin (ARANESP) injection - DIALYSIS  100 mcg Intravenous Q Mon-HD  . enoxaparin (LOVENOX) injection  30 mg Subcutaneous Q24H  . feeding supplement (NEPRO CARB STEADY)  237 mL Oral QHS  . HYDROcodone-acetaminophen  1 tablet  Oral Q6H  . insulin aspart  0-9 Units Subcutaneous Q4H  . insulin detemir  11 Units Subcutaneous BID  . lidocaine  1 patch Transdermal Q24H  . metoprolol tartrate  25 mg Oral BID  . multivitamin  1 tablet Oral QHS  . QUEtiapine  12.5 mg Oral QHS  . rifampin  300 mg Oral Q12H  . sevelamer carbonate  1,600 mg Oral TID WC  . sodium chloride flush  10-40 mL Intracatheter Q12H   Continuous Infusions: . sodium chloride 10 mL/hr at 01/13/20 1700  . vancomycin Stopped (01/19/20 1432)     LOS: 17 days    Time spent: 35 minutes spent on chart review, discussion with nursing staff, consultants, updating family and interview/physical exam; more than 50% of that time was spent in counseling and/or coordination of care.    Nichlas Pitera J [place] DO [facility] Available via Epic secure chat 7am-7pm After these hours, please refer to coverage provider listed on [email or web address] 01/21/2020, 2:38 PM

## 2020-01-21 NOTE — Sedation Documentation (Signed)
Pt not sedated

## 2020-01-21 NOTE — Progress Notes (Addendum)
Subjective: Seen in room, complains of being hungry , just back from IR, cystogram showing fistula patent and a new right IJ PermCath placed for use until lOP eft arm AV fistula hematoma improved  Objective Vital signs in last 24 hours: Vitals:   01/21/20 1010 01/21/20 1035 01/21/20 1045 01/21/20 1329  BP: (!) 154/75 (!) 149/71 (!) 157/69 (!) 156/66  Pulse:  80 79 79  Resp: (!) _0 Temp:    97.8 F (36.6 C)  TempSrc:    Oral  SpO2: 100% 97% 100% 100%  Weight:      Height:       Weight change:   Physical Exam General: Thin male, NAD. Room air. Heart: RRR; no murmur Lungs: CTA anteriorly Abdomen: soft, non-tender, nondistended Extremities:nopedal edema Dialysis Access: L AVF + bruit, some swelling around prox hematoma. Also with R IJ tunneled PermCath    OP Dialysis Orders: [facility] MWF 4h 43mn 99kg 2/2 bath LUA AVF Hep none - Calcitriol 1.254m PO q HD - Mircera 150 mcg IV q 2 weeks (last given on 12/29/19)  Problem/Plan: 1. 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  Will need rehab center placement, lives alone 2. MRSA bacteremia:Repeat BCx 8/6 negative. TTE 8/6 without vegetation. TDC removed 8/6, using LUE AVF until infiltrated 8/9,wasusing temp HD cath. On IV vanc and PO Rifampin per ID.  3. Resp failure: S/p intubation/vent8/9 -8/12. Now off vent - on room air. 4. Confusion: Likely uremic, as resolved w/ dialysis. Still with some memory issues - ?chronic. 5. ESRD: Continue HD on MWF schedule - next HD today. 6. Infiltrated AVRSW:NIOEVOJJKannulation on 8/16 however able to get it. Had to use AVF 1:1 on 8/18. IR fistulogram and  conversion temp line to tunneled. today. 7. HTN/volume: BP and edema improving. He is down > 10kg from admit. 8. Anemia: Hgb7.9 > 7.1 this a.m.. S/p 1U PRBCs 8/15. Aranesp 10072m(^ dose) given 8/16.  Follow-up  trend 9. Secondary hyperparathyroidism:CorrCa/Phos ok. Continue Renvela 1600m54mD, resume VDRA. 10. Nutrition: Alb very low 1.8- continue protein supplements.   DaviErnest [name][facility] Beeper [phone]/2021,1:36 PM  LOS: 17 days   Labs: Basic Metabolic Panel: Recent Labs  Lab 01/19/20 0332 01/20/20 0435 01/21/20 0401  NA 132* 133* 135  K 4.0 3.6 4.0  CL 95* 96* 98  CO2 _1 GLUCOSE 93 131* 140*  BUN 42* 23 35*  CREATININE 9.05* 6.83* 9.27*  CALCIUM 7.4* 7.4* 7.5*  PHOS 6.3* 4.5 6.0*   Liver Function Tests: Recent Labs  Lab 01/19/20 0332 01/20/20 0435 01/21/20 0401  AST  --  27 35  ALT  --  17 21  ALKPHOS  --  95 102  BILITOT  --  1.2 0.8  PROT  --  7.0 6.9  ALBUMIN 1.7* 1.9* 1.8*   No results for input(s): LIPASE, AMYLASE in the last 168 hours. No results for input(s): AMMONIA in the last 168 hours. CBC: Recent Labs  Lab 01/15/20 0410 01/15/20 0410 01/16/20 0338 01/16/20 0338 01/17/20 0349 01/19/20 0332 01/21/20 0401  WBC 13.0*   < > 11.1*   < > 10.6* 8.9 11.0*  HGB 7.2*   < > 6.9*   < > 7.7* 7.9* 7.1*  HCT 21.8*   < > 21.1*   < > 23.5* 23.8* 21.8*  MCV 82.0  --  82.4  --  81.9 82.6 83.5  PLT 228   < > 224   < > 220 204 250   < > = values in this interval not displayed.   Cardiac Enzymes: No results for input(s): CKTOTAL, CKMB, CKMBINDEX, TROPONINI in the last 168 hours. CBG: Recent Labs  Lab 01/20/20 1202 01/20/20 1610 01/21/20 0417 01/21/20 0845 01/21/20 1215  GLUCAP 115* 95 131* 117* 125*    Studies/Results: IR Fluoro Guide CV Line Right  Result Date: 01/21/2020 INDICATION: 69 year old male with end-stage renal disease on hemodialysis via a left upper extremity brachiocephalic arteriovenous fistula. He recently had an extravasation event with hematoma formation making it difficult to access the left upper extremity fistula. He currently has a temporary IJ hemodialysis catheter on the right but needs a  more durable access in till his fistula can again be used. EXAM: TUNNELED CENTRAL VENOUS HEMODIALYSIS CATHETER PLACEMENT WITH ULTRASOUND AND FLUOROSCOPIC GUIDANCE MEDICATIONS: 2 g Ancef. The antibiotic was given in an appropriate time interval prior to skin puncture. ANESTHESIA/SEDATION: None. FLUOROSCOPY TIME:  Fluoroscopy Time: 1 minutes 18 seconds (13 mGy). COMPLICATIONS: None immediate. PROCEDURE: Informed written consent was obtained from the patient after a discussion of the risks, benefits, and alternatives to treatment. Questions regarding the procedure were encouraged and answered. The right neck and chest were prepped with chlorhexidine in a sterile fashion, and a sterile drape was applied covering the operative field. Maximum barrier sterile technique with sterile gowns and gloves were used for the procedure. A timeout was performed prior to the initiation of the procedure. After creating a small venotomy incision, a micropuncture kit was utilized to access the right internal jugular vein under direct, real-time ultrasound guidance after the overlying soft tissues were anesthetized with 1% lidocaine with epinephrine. Ultrasound image documentation was performed. The microwire was kinked to measure appropriate catheter length. A stiff Glidewire was advanced to the level of the IVC and the micropuncture sheath was exchanged for a peel-away sheath. A palindrome tunneled hemodialysis catheter measuring 23 cm from tip to cuff was tunneled in a retrograde fashion from the anterior chest wall to the venotomy incision. The catheter was then placed through the peel-away sheath with tips ultimately positioned within the superior aspect of the right atrium. Final catheter positioning was confirmed and documented with a spot radiographic image. The catheter aspirates and flushes normally. The catheter was flushed with appropriate volume heparin dwells. The catheter exit site was secured with a 0-Prolene retention  suture. The venotomy incision was closed with Dermabond. Dressings were applied. The patient tolerated the procedure well without immediate post procedural complication. IMPRESSION: Successful placement of 23 cm tip to cuff tunneled hemodialysis catheter via the right internal jugular vein with tips terminating within the superior aspect of the right atrium. The catheter is ready for immediate use. The previously placed temporary right IJ catheter was removed. Electronically Signed   By: Jacqulynn Cadet M.D.   On: 01/21/2020 11:58   IR US Guide Vasc Access Left  Result Date: 01/21/2020 INDICATION: 69 year old male with end-stage renal disease on hemodialysis via a left upper extremity brachiocephalic arteriovenous fistula. He recently had an extravasation event with hematoma formation making it difficult to access the left upper extremity fistula. He currently has a temporary IJ hemodialysis catheter on the right but needs a more durable access in till his fistula can again be used. EXAM: TUNNELED CENTRAL VENOUS HEMODIALYSIS CATHETER PLACEMENT WITH ULTRASOUND AND FLUOROSCOPIC GUIDANCE MEDICATIONS: 2 g Ancef. The antibiotic was given in  an appropriate time interval prior to skin puncture. ANESTHESIA/SEDATION: None. FLUOROSCOPY TIME:  Fluoroscopy Time: 1 minutes 18 seconds (13 mGy). COMPLICATIONS: None immediate. PROCEDURE: Informed written consent was obtained from the patient after a discussion of the risks, benefits, and alternatives to treatment. Questions regarding the procedure were encouraged and answered. The right neck and chest were prepped with chlorhexidine in a sterile fashion, and a sterile drape was applied covering the operative field. Maximum barrier sterile technique with sterile gowns and gloves were used for the procedure. A timeout was performed prior to the initiation of the procedure. After creating a small venotomy incision, a micropuncture kit was utilized to access the right internal  jugular vein under direct, real-time ultrasound guidance after the overlying soft tissues were anesthetized with 1% lidocaine with epinephrine. Ultrasound image documentation was performed. The microwire was kinked to measure appropriate catheter length. A stiff Glidewire was advanced to the level of the IVC and the micropuncture sheath was exchanged for a peel-away sheath. A palindrome tunneled hemodialysis catheter measuring 23 cm from tip to cuff was tunneled in a retrograde fashion from the anterior chest wall to the venotomy incision. The catheter was then placed through the peel-away sheath with tips ultimately positioned within the superior aspect of the right atrium. Final catheter positioning was confirmed and documented with a spot radiographic image. The catheter aspirates and flushes normally. The catheter was flushed with appropriate volume heparin dwells. The catheter exit site was secured with a 0-Prolene retention suture. The venotomy incision was closed with Dermabond. Dressings were applied. The patient tolerated the procedure well without immediate post procedural complication. IMPRESSION: Successful placement of 23 cm tip to cuff tunneled hemodialysis catheter via the right internal jugular vein with tips terminating within the superior aspect of the right atrium. The catheter is ready for immediate use. The previously placed temporary right IJ catheter was removed. Electronically Signed   By: Jacqulynn Cadet M.D.   On: 01/21/2020 11:58   Medications: . sodium chloride 10 mL/hr at 01/13/20 1700  . vancomycin Stopped (01/19/20 1432)   . (feeding supplement) PROSource Plus  30 mL Oral BID BM  . amLODipine  10 mg Oral Daily  . atorvastatin  20 mg Oral QHS  . calcitRIOL  1.25 mcg Oral Once per day on Mon Wed Fri  . chlorhexidine gluconate (MEDLINE KIT)  15 mL Mouth Rinse BID  . Chlorhexidine Gluconate Cloth  6 each Topical Q0600  . darbepoetin (ARANESP) injection - DIALYSIS  100 mcg  Intravenous Q Mon-HD  . enoxaparin (LOVENOX) injection  30 mg Subcutaneous Q24H  . feeding supplement (NEPRO CARB STEADY)  237 mL Oral QHS  . HYDROcodone-acetaminophen  1 tablet Oral Q6H  . insulin aspart  0-9 Units Subcutaneous Q4H  . insulin detemir  11 Units Subcutaneous BID  . lidocaine  1 patch Transdermal Q24H  . metoprolol tartrate  25 mg Oral BID  . multivitamin  1 tablet Oral QHS  . QUEtiapine  12.5 mg Oral QHS  . rifampin  300 mg Oral Q12H  . sevelamer carbonate  1,600 mg Oral TID WC  . sodium chloride flush  10-40 mL Intracatheter Q12H

## 2020-01-21 NOTE — Progress Notes (Signed)
Patient is refusing to allow any treatment.  Refusing to go to HD.  Refused to take any medication.  Dr. [place] aware.  Wanted to speak to daughter Debroah Baller and go home AMA.  Called and left message for daughter to call and speak to staff and/or patient.

## 2020-01-21 NOTE — Plan of Care (Signed)

## 2020-01-21 NOTE — Procedures (Signed)
Interventional Radiology Procedure Note  Procedure:   1.) [place] evaluation of LUE shows hematoma superficial to the fistula in the upper arm 2.) Fistulogram shows a normally functioning left upper arm brachiocephalic AVF. No stenosis, there are multiple small branch veins.  3.) Placement of tunneled 23 cm Palindrome HD catheter via right IJ to allow HD while left arm hematoma resolves.   Complications: None  Estimated Blood Loss: None  Recommendations: - Routine line care - May resume use of left arm AVF when access nurses feel comfortable accessing again.    Signed,  Criselda Peaches, MD

## 2020-01-21 NOTE — Progress Notes (Signed)
OT Cancellation Note  Patient Details Name: [name]. MRN: [id number] DOB: [birth year]   Cancelled Treatment:    Reason Eval/Treat Not Completed: Patient at procedure or test/ unavailable  Malka So 01/21/2020, 12:20 PM  Nestor Lewandowsky, OTR/L Acute Rehabilitation Services Pager: [phone] Office: [phone]

## 2020-01-21 NOTE — Progress Notes (Signed)
Refused all meds this evening including insulin

## 2020-01-21 NOTE — Progress Notes (Signed)
Occupational Therapy Treatment Patient Details Name: [name]. MRN: [id number] DOB: [birth year] Today's Date: 01/21/2020    History of present illness [name]. is a 69 y.o. male admitted 01/04/20 secondary to worsening headache and back pain. Found to have epidural abscess with associated cord compression in addition to MRSA positive bacteremia. S/p C4-T2 laminectomy and fusion on 8/5. Transferred to ICU 8/9 due to AMS. Intubated 8/10-8/12 due to worsening mental and respiratory status. Head CT and MRI negative for acute injury. PMH includes ESRD (HD MWF), DM, HTN, anemia, colon CA s/p resection.   OT comments  Pt making very little progress due to pain. Continues to refuse c-collar.   Follow Up Recommendations  SNF;Supervision/Assistance - 24 hour    Equipment Recommendations       Recommendations for Other Services      Precautions / Restrictions Precautions Precautions: Fall;Cervical Precaution Comments: Pt declining use of brace despite education Required Braces or Orthoses: Cervical Brace Cervical Brace: Hard collar (when upright or OOB) Restrictions Weight Bearing Restrictions: No       Mobility Bed Mobility Overal bed mobility: Needs Assistance Bed Mobility: Supine to Sit;Sit to Supine     Supine to sit: Min assist Sit to supine: Min guard   General bed mobility comments: attempted to get pt to sit at EOB for longer period to eat, unsuccessful, pt tolerated about 2 minutes  Transfers                 General transfer comment: declined    Balance Overall balance assessment: Needs assistance   Sitting balance-Leahy Scale: Fair                                     ADL either performed or assessed with clinical judgement   ADL Overall ADL's : Needs assistance/impaired Eating/Feeding: Set up;Bed level   Grooming: Wash/dry hands;Wash/dry face;Bed level;Set up               Lower Body Dressing: Total assistance;Bed  level Lower Body Dressing Details (indicate cue type and reason): Total A to don socks bed level.                     Vision       Perception     Praxis      Cognition Arousal/Alertness: Awake/alert Behavior During Therapy: Impulsive;Anxious Overall Cognitive Status: No family/caregiver present to determine baseline cognitive functioning Area of Impairment: Safety/judgement;Following commands;Memory;Attention;Problem solving;Awareness                     Memory: Decreased recall of precautions;Decreased short-term memory Following Commands: Follows one step commands inconsistently     Problem Solving: Slow processing;Difficulty sequencing;Requires verbal cues;Requires tactile cues          Exercises     Shoulder Instructions       General Comments      Pertinent Vitals/ Pain       Pain Assessment: Faces Faces Pain Scale: Hurts even more Pain Location: Neck, shoulders Pain Descriptors / Indicators: Grimacing;Guarding;Restless Pain Intervention(s): Monitored during session;Repositioned  Home Living                                          Prior Functioning/Environment  Frequency  Min 2X/week        Progress Toward Goals  OT Goals(current goals can now be found in the care plan section)  Progress towards OT goals: Not progressing toward goals - comment (pt limited by pain)  Acute Rehab OT Goals Patient Stated Goal: to get this pain better OT Goal Formulation: With patient Time For Goal Achievement: 01/28/20 Potential to Achieve Goals: [place] Discharge plan remains appropriate    Co-evaluation                 AM-PAC OT "6 Clicks" Daily Activity     Outcome Measure   Help from another person eating meals?: A Little Help from another person taking care of personal grooming?: A Little Help from another person toileting, which includes using toliet, bedpan, or urinal?: Total Help from  another person bathing (including washing, rinsing, drying)?: A Lot Help from another person to put on and taking off regular upper body clothing?: A Lot Help from another person to put on and taking off regular lower body clothing?: Total 6 Click Score: 12    End of Session    OT Visit Diagnosis: Muscle weakness (generalized) (M62.81);Other symptoms and signs involving cognitive function;Pain   Activity Tolerance Patient limited by pain   Patient Left in bed;with call bell/phone within reach;with bed alarm set   Nurse Communication          Time: 1430-1450 OT Time Calculation (min): 20 min  Charges: OT General Charges $OT Visit: 1 Visit OT Treatments $Self Care/Home Management : 8-22 mins  Nestor Lewandowsky, OTR/L Acute Rehabilitation Services Pager: [phone] Office: [phone]   Malka So 01/21/2020, 2:01 PM

## 2020-01-21 NOTE — Progress Notes (Signed)
Dr. [place] in room with patient, Patient's daughter [name] on speaker phone with patient discussing treatment plans with patient.     Patient agreed to take Rifampin only.

## 2020-01-22 LAB — CBC
HCT: 22.9 % — ABNORMAL LOW (ref 39.0–52.0)
Hemoglobin: 7.5 g/dL — ABNORMAL LOW (ref 13.0–17.0)
MCH: 27.2 pg (ref 26.0–34.0)
MCHC: 32.8 g/dL (ref 30.0–36.0)
MCV: 83 fL (ref 80.0–100.0)
Platelets: 234 10*3/uL (ref 150–400)
RBC: 2.76 MIL/uL — ABNORMAL LOW (ref 4.22–5.81)
RDW: 16.8 % — ABNORMAL HIGH (ref 11.5–15.5)
WBC: 9.5 10*3/uL (ref 4.0–10.5)
nRBC: 0 % (ref 0.0–0.2)

## 2020-01-22 LAB — RENAL FUNCTION PANEL
Albumin: 1.9 g/dL — ABNORMAL LOW (ref 3.5–5.0)
Anion gap: 16 — ABNORMAL HIGH (ref 5–15)
BUN: 45 mg/dL — ABNORMAL HIGH (ref 8–23)
CO2: 20 mmol/L — ABNORMAL LOW (ref 22–32)
Calcium: 7.5 mg/dL — ABNORMAL LOW (ref 8.9–10.3)
Chloride: 98 mmol/L (ref 98–111)
Creatinine, Ser: 11.41 mg/dL — ABNORMAL HIGH (ref 0.61–1.24)
GFR calc Af Amer: 5 mL/min — ABNORMAL LOW (ref >60)
GFR calc non Af Amer: 4 mL/min — ABNORMAL LOW (ref >60)
Glucose, Bld: 106 mg/dL — ABNORMAL HIGH (ref 70–99)
Phosphorus: 6.7 mg/dL — ABNORMAL HIGH (ref 2.5–4.6)
Potassium: 4.3 mmol/L (ref 3.5–5.1)
Sodium: 134 mmol/L — ABNORMAL LOW (ref 135–145)

## 2020-01-22 LAB — GLUCOSE, CAPILLARY
Glucose-Capillary: 104 mg/dL — ABNORMAL HIGH (ref 70–99)
Glucose-Capillary: 113 mg/dL — ABNORMAL HIGH (ref 70–99)
Glucose-Capillary: 117 mg/dL — ABNORMAL HIGH (ref 70–99)
Glucose-Capillary: 80 mg/dL (ref 70–99)

## 2020-01-22 MED ORDER — VANCOMYCIN HCL 750 MG/150ML IV SOLN
750.0000 mg | Freq: Once | INTRAVENOUS | Status: AC
  Administered 2020-01-22: 750 mg via INTRAVENOUS
  Filled 2020-01-22: qty 150

## 2020-01-22 NOTE — Progress Notes (Signed)
PROGRESS NOTE    [name].  GYB:[id number] DOB: 09/23/1950 DOA: 01/04/2020 PCP: [facility]    Brief Narrative:  Rodney Dais. is an 69 y.o. male  veteran withhistory of ESRD on hemodialysis on Monday Wednesday Friday, hypertension, diabetes mellitus type 2, anemia history of colon cancer status post resection has been having increasing headache back pain and found to have epidural abscess and is s/p laminectomy and MRSA septicemia.  Required intubation due to AMS-- continues to have issues with short term memory and sundowning.   Assessment & Plan:   Principal Problem:   MRSA bacteremia Active Problems:   TOBACCO ABUSE   OBSTRUCTIVE SLEEP APNEA   Colon cancer, status Post resection    HYPERTENSION   ESRD (end stage renal disease) ([place])   Epidural abscess   Acute encephalopathy   Sepsis ([place])   Bacteremia associated with intravascular line ([place])   Hemodialysis patient ([place])   Acute hypoxic respiratory failure with compromised airwaysecondary to acute encephalopathy Patient was briefly intubated requiring ventilatory support on 01/11/2020 and successfully extubated on 01/13/2020.  Oxygenating well on room air.  Acute metabolic toxic encephalopathy: short term memory seems to be impaired and I have concerns about his ability to make his own medical decisions currently CT Head and MRI brain negative for acute intracranial abnormalities; Suspect to be secondary to sepsis vs substance abuse and doubt uremia currently.He had HD sessions per MWF schedule with improvement in mental status. --Continue seroquel qHS for sundowning --Continue high-dose thiamine  MRSA bacteremia  Epidural abscess s/p laminectomy and fusion: --WBC 25>>>9.5 --ESR 115>126 --CRP 17.9>12.8 --Continued on vancomycin q M/W/F w/ HD and rifampin 362m PO BID x 8 weeks total per ID; through October 1 per ID --Will need weekly LFTs on rifampin; weekly ESR/CRP --Vanco level per  pharmacy --Outpatient appointment with ID on September 2 at 11 AM with SJanene Madeira NP  ESRD on HD: Patient continues to have issues regarding his AV fistula requiring use of a temporary right IJ catheter placed by PCCM on 01/10/2020.  Underwent left upper extremity fistulogram with no significant stenosis or occlusion with superficial subcutaneous hematoma in the upper arm overlying portion of the access which could result in temporary difficult cannulation.  IR also placed a tunneled HD catheter on 01/21/2020 --Continue HD per nephrology  Anemia of chronic disease: No signs of active bleeding.Suspect secondary to acute illness and anemia of chronic disease.  Received 1 unit PRBC on 01/16/2020. --Hgb 7.9>7.1>7.5; continue transfusions as needed with HD --aranesp per nephrology  Type 2 DM with hyperglycemia Hemoglobin A1c 6.5 on 01/12/2020, well controlled --Levemir 11u [place] daily --Continue insulin sliding scale for further coverage --CBGs before every meal/at bedtime --Has been intermittently refusing medications and lab testing/CBG checks  Alcohol abuse/cocaine abuse --encourage cessation --thiamine high dose x 3 days   Essential hypertension Continue metoprolol tartrate 25 mg p.o. twice daily, Amlodipine 10 mg p.o. daily  Hyperlipidemia: Continue atorvastatin 20 g p.o. daily   DVT prophylaxis: Lovenox ordered, but refuses intermittently Code Status: Full code Family Communication: No family present at bedside  Disposition Plan:  Status is: Inpatient  Remains inpatient appropriate because:Altered mental status, Unsafe d/c plan and IV treatments appropriate due to intensity of illness or inability to take PO   Dispo: The patient is from: Home              [facility]  Anticipated d/c date is: 2 days, plan discharge to [facility] and rehab on 01/24/2020              Patient currently is not medically stable to  d/c.   Consultants:   PCCM  Nephrology  Infectious disease  Procedures:   Intubation 8/10, extubated 01/13/2020  Right IJ nontunneled HD catheter, 8/9 - 8/20  Right IJ tunneled HD catheter, 8/20  IR fistulogram, 8/20  Antimicrobials:   Rifampin  Vancomycin   Subjective: Patient seen and examined bedside, resting comfortably.  Just returned from IR after fistulogram and tunneled HD catheter placement.  Requesting lunch.  Continues to refuse medications and lab checks intermittently.  No other complaints or concerns at this time. Denies headache, no visual changes, no chest pain, no palpitations, no shortness of breath, no abdominal pain.  Nursing concerned about his demeanor and refusal of medical therapy, otherwise no acute events overnight per nursing staff.  Objective: Vitals:   01/22/20 1030 01/22/20 1100 01/22/20 1130 01/22/20 1142  BP: (!) 151/66 140/71 135/70 140/68  Pulse: 88 93 90 88  Resp: _0 Temp:    98.6 F (37 C)  TempSrc:    Oral  SpO2:    100%  Weight:    86.5 kg  Height:        Intake/Output Summary (Last 24 hours) at 01/22/2020 1446 Last data filed at 01/22/2020 1142 Gross per 24 hour  Intake --  Output 2350 ml  Net -2350 ml   Filed Weights   01/22/20 0500 01/22/20 0830 01/22/20 1142  Weight: 87.9 kg 88 kg 86.5 kg    Examination:  General exam: Appears calm and comfortable, irritable Respiratory system: Clear to auscultation. Respiratory effort normal. Cardiovascular system: S1 & S2 heard, RRR. No JVD, murmurs, rubs, gallops or clicks. No pedal edema.  Right IJ HD catheter noted Gastrointestinal system: Abdomen is nondistended, soft and nontender. No organomegaly or masses felt. Normal bowel sounds heard. Central nervous system: Alert and oriented. No focal neurological deficits. Extremities: Symmetric 5 x 5 power. Skin: No rashes, lesions or ulcers Psychiatry: Judgement and insight appear poor. Mood & affect appropriate.      Data Reviewed: I have personally reviewed following labs and imaging studies  CBC: Recent Labs  Lab 01/16/20 0338 01/17/20 0349 01/19/20 0332 01/21/20 0401 01/22/20 0345  WBC 11.1* 10.6* 8.9 11.0* 9.5  HGB 6.9* 7.7* 7.9* 7.1* 7.5*  HCT 21.1* 23.5* 23.8* 21.8* 22.9*  MCV 82.4 81.9 82.6 83.5 83.0  PLT 224 220 204 250 532   Basic Metabolic Panel: Recent Labs  Lab 01/18/20 0412 01/19/20 0332 01/20/20 0435 01/21/20 0401 01/22/20 0345  NA 134* 132* 133* 135 134*  K 3.7 4.0 3.6 4.0 4.3  CL 96* 95* 96* 98 98  CO2 _1 20*  GLUCOSE 168* 93 131* 140* 106*  BUN 31* 42* 23 35* 45*  CREATININE 6.75* 9.05* 6.83* 9.27* 11.41*  CALCIUM 7.5* 7.4* 7.4* 7.5* 7.5*  PHOS 4.9* 6.3* 4.5 6.0* 6.7*   GFR: Estimated Creatinine Clearance: 6.7 mL/min (A) (by C-G formula based on SCr of 11.41 mg/dL (H)). Liver Function Tests: Recent Labs  Lab 01/18/20 0412 01/19/20 0332 01/20/20 0435 01/21/20 0401 01/22/20 0345  AST  --   --  27 35  --   ALT  --   --  17 21  --   ALKPHOS  --   --  95 102  --   BILITOT  --   --  1.2 0.8  --   PROT  --   --  7.0 6.9  --   ALBUMIN 1.7* 1.7* 1.9* 1.8* 1.9*   No results for input(s): LIPASE, AMYLASE in the last 168 hours. No results for input(s): AMMONIA in the last 168 hours. Coagulation Profile: No results for input(s): INR, PROTIME in the last 168 hours. Cardiac Enzymes: No results for input(s): CKTOTAL, CKMB, CKMBINDEX, TROPONINI in the last 168 hours. BNP (last 3 results) No results for input(s): PROBNP in the last 8760 hours. HbA1C: No results for input(s): HGBA1C in the last 72 hours. CBG: Recent Labs  Lab 01/21/20 1932 01/21/20 2337 01/22/20 0344 01/22/20 0806 01/22/20 1241  GLUCAP 109* 133* 104* 113* 117*   Lipid Profile: No results for input(s): CHOL, HDL, LDLCALC, TRIG, CHOLHDL, LDLDIRECT in the last 72 hours. Thyroid Function Tests: No results for input(s): TSH, T4TOTAL, FREET4, T3FREE, THYROIDAB in the last 72  hours. Anemia Panel: No results for input(s): VITAMINB12, FOLATE, FERRITIN, TIBC, IRON, RETICCTPCT in the last 72 hours. Sepsis Labs: No results for input(s): PROCALCITON, LATICACIDVEN in the last 168 hours.  No results found for this or any previous visit (from the past 240 hour(s)).       Radiology Studies: IR Fluoro Guide CV Line Right  Result Date: 01/21/2020 INDICATION: 69 year old male with end-stage renal disease on hemodialysis via a left upper extremity brachiocephalic arteriovenous fistula. He recently had an extravasation event with hematoma formation making it difficult to access the left upper extremity fistula. He currently has a temporary IJ hemodialysis catheter on the right but needs a more durable access in till his fistula can again be used. EXAM: TUNNELED CENTRAL VENOUS HEMODIALYSIS CATHETER PLACEMENT WITH ULTRASOUND AND FLUOROSCOPIC GUIDANCE MEDICATIONS: 2 g Ancef. The antibiotic was given in an appropriate time interval prior to skin puncture. ANESTHESIA/SEDATION: None. FLUOROSCOPY TIME:  Fluoroscopy Time: 1 minutes 18 seconds (13 mGy). COMPLICATIONS: None immediate. PROCEDURE: Informed written consent was obtained from the patient after a discussion of the risks, benefits, and alternatives to treatment. Questions regarding the procedure were encouraged and answered. The right neck and chest were prepped with chlorhexidine in a sterile fashion, and a sterile drape was applied covering the operative field. Maximum barrier sterile technique with sterile gowns and gloves were used for the procedure. A timeout was performed prior to the initiation of the procedure. After creating a small venotomy incision, a micropuncture kit was utilized to access the right internal jugular vein under direct, real-time ultrasound guidance after the overlying soft tissues were anesthetized with 1% lidocaine with epinephrine. Ultrasound image documentation was performed. The microwire was kinked to  measure appropriate catheter length. A stiff Glidewire was advanced to the level of the IVC and the micropuncture sheath was exchanged for a peel-away sheath. A palindrome tunneled hemodialysis catheter measuring 23 cm from tip to cuff was tunneled in a retrograde fashion from the anterior chest wall to the venotomy incision. The catheter was then placed through the peel-away sheath with tips ultimately positioned within the superior aspect of the right atrium. Final catheter positioning was confirmed and documented with a spot radiographic image. The catheter aspirates and flushes normally. The catheter was flushed with appropriate volume heparin dwells. The catheter exit site was secured with a 0-Prolene retention suture. The venotomy incision was closed with Dermabond. Dressings were applied. The patient tolerated the procedure well without immediate post procedural complication. IMPRESSION: Successful placement of 23 cm tip to cuff tunneled hemodialysis catheter via the right internal jugular  vein with tips terminating within the superior aspect of the right atrium. The catheter is ready for immediate use. The previously placed temporary right IJ catheter was removed. Electronically Signed   By: Jacqulynn Cadet M.D.   On: 01/21/2020 11:58   IR US Guide Vasc Access Left  Result Date: 01/21/2020 INDICATION: 69 year old male with end-stage renal disease on hemodialysis via a left upper extremity brachiocephalic arteriovenous fistula. He recently had an extravasation event with hematoma formation making it difficult to access the left upper extremity fistula. He currently has a temporary IJ hemodialysis catheter on the right but needs a more durable access in till his fistula can again be used. EXAM: TUNNELED CENTRAL VENOUS HEMODIALYSIS CATHETER PLACEMENT WITH ULTRASOUND AND FLUOROSCOPIC GUIDANCE MEDICATIONS: 2 g Ancef. The antibiotic was given in an appropriate time interval prior to skin puncture.  ANESTHESIA/SEDATION: None. FLUOROSCOPY TIME:  Fluoroscopy Time: 1 minutes 18 seconds (13 mGy). COMPLICATIONS: None immediate. PROCEDURE: Informed written consent was obtained from the patient after a discussion of the risks, benefits, and alternatives to treatment. Questions regarding the procedure were encouraged and answered. The right neck and chest were prepped with chlorhexidine in a sterile fashion, and a sterile drape was applied covering the operative field. Maximum barrier sterile technique with sterile gowns and gloves were used for the procedure. A timeout was performed prior to the initiation of the procedure. After creating a small venotomy incision, a micropuncture kit was utilized to access the right internal jugular vein under direct, real-time ultrasound guidance after the overlying soft tissues were anesthetized with 1% lidocaine with epinephrine. Ultrasound image documentation was performed. The microwire was kinked to measure appropriate catheter length. A stiff Glidewire was advanced to the level of the IVC and the micropuncture sheath was exchanged for a peel-away sheath. A palindrome tunneled hemodialysis catheter measuring 23 cm from tip to cuff was tunneled in a retrograde fashion from the anterior chest wall to the venotomy incision. The catheter was then placed through the peel-away sheath with tips ultimately positioned within the superior aspect of the right atrium. Final catheter positioning was confirmed and documented with a spot radiographic image. The catheter aspirates and flushes normally. The catheter was flushed with appropriate volume heparin dwells. The catheter exit site was secured with a 0-Prolene retention suture. The venotomy incision was closed with Dermabond. Dressings were applied. The patient tolerated the procedure well without immediate post procedural complication. IMPRESSION: Successful placement of 23 cm tip to cuff tunneled hemodialysis catheter via the right  internal jugular vein with tips terminating within the superior aspect of the right atrium. The catheter is ready for immediate use. The previously placed temporary right IJ catheter was removed. Electronically Signed   By: Jacqulynn Cadet M.D.   On: 01/21/2020 11:58   IR US Guide Vasc Access Right  Result Date: 01/21/2020 INDICATION: 69 year old male with end-stage renal disease. He was dialyzing via a left upper extremity arteriovenous fistula but suffered in extravasation event resulting in hematoma formation in the arm and is now dialyzing via a temporary IJ catheter on the right. EXAM: Fistulagram MEDICATIONS: None. ANESTHESIA/SEDATION: None FLUOROSCOPY TIME:  Fluoroscopy Time: 1 minutes 18 seconds (13 mGy). COMPLICATIONS: None immediate. PROCEDURE: Informed written consent was obtained from the patient after a thorough discussion of the procedural risks, benefits and alternatives. All questions were addressed. Maximal Sterile Barrier Technique was utilized including caps, mask, sterile gowns, sterile gloves, sterile drape, hand hygiene and skin antiseptic. A timeout was performed prior to the initiation of  the procedure. The left upper extremity AV fistula was interrogated with ultrasound and found to be widely patent. An image was obtained and stored for the medical record. Local anesthesia was attained by infiltration with 1% lidocaine. A small dermatotomy was made. Under real-time sonographic guidance, the vessel was punctured with a 21 gauge micropuncture needle. Using standard technique, the initial micro needle was exchanged over a 0.018 micro wire for a transitional 4 [place] micro sheath. A diagnostic fistulagram was then performed. This is a left upper extremity brachiocephalic arteriovenous fistula. There are multiple side branches with some cross-filling between the cephalic vein and the deeper brachial basilic system. Mild tortuosity present at the cephalic arch. However, there is not appear  to be a hemodynamically significant stenosis or evidence of clot. The central veins are widely patent. The arterial anastomosis also appears widely patent. Normal palpable thrill. Sonographic evaluation of the upper extremity does demonstrate a moderately large subcutaneous hematoma overlying a portion of the access zone. IMPRESSION: 1. Left upper extremity brachiocephalic arteriovenous fistula without evidence of significant stenosis or occlusion. 2. There are multiple small side branches but these do not appear to limit flow or function. 3. Superficial subcutaneous hematoma in the upper arm overlying a portion of the access zone may result in temporarily difficult cannulation. ACCESS: This access remains amenable to future percutaneous interventions as clinically indicated. Electronically Signed   By: Jacqulynn Cadet M.D.   On: 01/21/2020 13:52   IR DIALY SHUNT INTRO NEEDLE/INTRACATH INITIAL W/IMG LEFT  Result Date: 01/21/2020 INDICATION: 69 year old male with end-stage renal disease. He was dialyzing via a left upper extremity arteriovenous fistula but suffered in extravasation event resulting in hematoma formation in the arm and is now dialyzing via a temporary IJ catheter on the right. EXAM: Fistulagram MEDICATIONS: None. ANESTHESIA/SEDATION: None FLUOROSCOPY TIME:  Fluoroscopy Time: 1 minutes 18 seconds (13 mGy). COMPLICATIONS: None immediate. PROCEDURE: Informed written consent was obtained from the patient after a thorough discussion of the procedural risks, benefits and alternatives. All questions were addressed. Maximal Sterile Barrier Technique was utilized including caps, mask, sterile gowns, sterile gloves, sterile drape, hand hygiene and skin antiseptic. A timeout was performed prior to the initiation of the procedure. The left upper extremity AV fistula was interrogated with ultrasound and found to be widely patent. An image was obtained and stored for the medical record. Local anesthesia was  attained by infiltration with 1% lidocaine. A small dermatotomy was made. Under real-time sonographic guidance, the vessel was punctured with a 21 gauge micropuncture needle. Using standard technique, the initial micro needle was exchanged over a 0.018 micro wire for a transitional 4 [place] micro sheath. A diagnostic fistulagram was then performed. This is a left upper extremity brachiocephalic arteriovenous fistula. There are multiple side branches with some cross-filling between the cephalic vein and the deeper brachial basilic system. Mild tortuosity present at the cephalic arch. However, there is not appear to be a hemodynamically significant stenosis or evidence of clot. The central veins are widely patent. The arterial anastomosis also appears widely patent. Normal palpable thrill. Sonographic evaluation of the upper extremity does demonstrate a moderately large subcutaneous hematoma overlying a portion of the access zone. IMPRESSION: 1. Left upper extremity brachiocephalic arteriovenous fistula without evidence of significant stenosis or occlusion. 2. There are multiple small side branches but these do not appear to limit flow or function. 3. Superficial subcutaneous hematoma in the upper arm overlying a portion of the access zone may result in temporarily difficult cannulation. ACCESS: This  access remains amenable to future percutaneous interventions as clinically indicated. Electronically Signed   By: Jacqulynn Cadet M.D.   On: 01/21/2020 13:52        Scheduled Meds: . (feeding supplement) PROSource Plus  30 mL Oral BID BM  . amLODipine  10 mg Oral Daily  . atorvastatin  20 mg Oral QHS  . calcitRIOL  1.25 mcg Oral Once per day on Mon Wed Fri  . chlorhexidine gluconate (MEDLINE KIT)  15 mL Mouth Rinse BID  . Chlorhexidine Gluconate Cloth  6 each Topical Q0600  . darbepoetin (ARANESP) injection - DIALYSIS  100 mcg Intravenous Q Mon-HD  . enoxaparin (LOVENOX) injection  30 mg Subcutaneous Q24H   . feeding supplement (NEPRO CARB STEADY)  237 mL Oral QHS  . HYDROcodone-acetaminophen  1 tablet Oral Q6H  . insulin aspart  0-9 Units Subcutaneous Q4H  . insulin detemir  11 Units Subcutaneous BID  . lidocaine  1 patch Transdermal Q24H  . metoprolol tartrate  25 mg Oral BID  . multivitamin  1 tablet Oral QHS  . QUEtiapine  12.5 mg Oral QHS  . rifampin  300 mg Oral Q12H  . sevelamer carbonate  1,600 mg Oral TID WC  . sodium chloride flush  10-40 mL Intracatheter Q12H   Continuous Infusions: . sodium chloride 10 mL/hr at 01/13/20 1700  . vancomycin Stopped (01/19/20 1432)     LOS: 18 days    Time spent: 35 minutes spent on chart review, discussion with nursing staff, consultants, updating family and interview/physical exam; more than 50% of that time was spent in counseling and/or coordination of care.    Kippy Melena J [place] DO [facility] Available via Epic secure chat 7am-7pm After these hours, please refer to coverage provider listed on [email or web address] 01/22/2020, 2:46 PM

## 2020-01-22 NOTE — Progress Notes (Signed)
continues to refuse most plan of care including medicationd

## 2020-01-22 NOTE — Progress Notes (Signed)
Pt to HD

## 2020-01-22 NOTE — Progress Notes (Signed)
Pt refused 4pm blood sugar check. Pt refused lovenox and other evening megs stating "if I get a blood clot then I will take a blood thinner, you're not giving me those experimental drugs."

## 2020-01-22 NOTE — Progress Notes (Signed)
[facility] Progress Note   Subjective:  Seen on HD - 2.5L UF and tolerating. No CP/dyspnea overnight.  Objective Vitals:   01/22/20 0900 01/22/20 0930 01/22/20 1000 01/22/20 1030  BP: 134/76 (!) 150/84 (!) 159/83 (!) 151/66  Pulse: 84 84 96   Resp: _0 Temp:      TempSrc:      SpO2:      Weight:      Height:       Physical Exam General: Chronically ill man, NAD Heart: RRR; no murmur Lungs: CTA anteriorly Abdomen: soft, non-tender Extremities: No LE edema Dialysis Access: LUE AVF + bruit  Additional Objective Labs: Basic Metabolic Panel: Recent Labs  Lab 01/20/20 0435 01/21/20 0401 01/22/20 0345  NA 133* 135 134*  K 3.6 4.0 4.3  CL 96* 98 98  CO2 26 25 20*  GLUCOSE 131* 140* 106*  BUN 23 35* 45*  CREATININE 6.83* 9.27* 11.41*  CALCIUM 7.4* 7.5* 7.5*  PHOS 4.5 6.0* 6.7*   Liver Function Tests: Recent Labs  Lab 01/20/20 0435 01/21/20 0401 01/22/20 0345  AST 27 35  --   ALT 17 21  --   ALKPHOS 95 102  --   BILITOT 1.2 0.8  --   PROT 7.0 6.9  --   ALBUMIN 1.9* 1.8* 1.9*   CBC: Recent Labs  Lab 01/16/20 0338 01/16/20 0338 01/17/20 0349 01/17/20 0349 01/19/20 0332 01/21/20 0401 01/22/20 0345  WBC 11.1*   < > 10.6*   < > 8.9 11.0* 9.5  HGB 6.9*   < > 7.7*   < > 7.9* 7.1* 7.5*  HCT 21.1*   < > 23.5*   < > 23.8* 21.8* 22.9*  MCV 82.4  --  81.9  --  82.6 83.5 83.0  PLT 224   < > 220   < > 204 250 234   < > = values in this interval not displayed.   CBG: Recent Labs  Lab 01/21/20 1613 01/21/20 1932 01/21/20 2337 01/22/20 0344 01/22/20 0806  GLUCAP 145* 109* 133* 104* 113*   Studies/Results: IR Fluoro Guide CV Line Right  Result Date: 01/21/2020 INDICATION: 69 year old male with end-stage renal disease on hemodialysis via a left upper extremity brachiocephalic arteriovenous fistula. He recently had an extravasation event with hematoma formation making it difficult to access the left upper extremity fistula. He currently  has a temporary IJ hemodialysis catheter on the right but needs a more durable access in till his fistula can again be used. EXAM: TUNNELED CENTRAL VENOUS HEMODIALYSIS CATHETER PLACEMENT WITH ULTRASOUND AND FLUOROSCOPIC GUIDANCE MEDICATIONS: 2 g Ancef. The antibiotic was given in an appropriate time interval prior to skin puncture. ANESTHESIA/SEDATION: None. FLUOROSCOPY TIME:  Fluoroscopy Time: 1 minutes 18 seconds (13 mGy). COMPLICATIONS: None immediate. PROCEDURE: Informed written consent was obtained from the patient after a discussion of the risks, benefits, and alternatives to treatment. Questions regarding the procedure were encouraged and answered. The right neck and chest were prepped with chlorhexidine in a sterile fashion, and a sterile drape was applied covering the operative field. Maximum barrier sterile technique with sterile gowns and gloves were used for the procedure. A timeout was performed prior to the initiation of the procedure. After creating a small venotomy incision, a micropuncture kit was utilized to access the right internal jugular vein under direct, real-time ultrasound guidance after the overlying soft tissues were anesthetized with 1% lidocaine with epinephrine. Ultrasound image documentation was performed. The microwire was kinked to  measure appropriate catheter length. A stiff Glidewire was advanced to the level of the IVC and the micropuncture sheath was exchanged for a peel-away sheath. A palindrome tunneled hemodialysis catheter measuring 23 cm from tip to cuff was tunneled in a retrograde fashion from the anterior chest wall to the venotomy incision. The catheter was then placed through the peel-away sheath with tips ultimately positioned within the superior aspect of the right atrium. Final catheter positioning was confirmed and documented with a spot radiographic image. The catheter aspirates and flushes normally. The catheter was flushed with appropriate volume heparin dwells.  The catheter exit site was secured with a 0-Prolene retention suture. The venotomy incision was closed with Dermabond. Dressings were applied. The patient tolerated the procedure well without immediate post procedural complication. IMPRESSION: Successful placement of 23 cm tip to cuff tunneled hemodialysis catheter via the right internal jugular vein with tips terminating within the superior aspect of the right atrium. The catheter is ready for immediate use. The previously placed temporary right IJ catheter was removed. Electronically Signed   By: Jacqulynn Cadet M.D.   On: 01/21/2020 11:58   IR US Guide Vasc Access Left  Result Date: 01/21/2020 INDICATION: 69 year old male with end-stage renal disease on hemodialysis via a left upper extremity brachiocephalic arteriovenous fistula. He recently had an extravasation event with hematoma formation making it difficult to access the left upper extremity fistula. He currently has a temporary IJ hemodialysis catheter on the right but needs a more durable access in till his fistula can again be used. EXAM: TUNNELED CENTRAL VENOUS HEMODIALYSIS CATHETER PLACEMENT WITH ULTRASOUND AND FLUOROSCOPIC GUIDANCE MEDICATIONS: 2 g Ancef. The antibiotic was given in an appropriate time interval prior to skin puncture. ANESTHESIA/SEDATION: None. FLUOROSCOPY TIME:  Fluoroscopy Time: 1 minutes 18 seconds (13 mGy). COMPLICATIONS: None immediate. PROCEDURE: Informed written consent was obtained from the patient after a discussion of the risks, benefits, and alternatives to treatment. Questions regarding the procedure were encouraged and answered. The right neck and chest were prepped with chlorhexidine in a sterile fashion, and a sterile drape was applied covering the operative field. Maximum barrier sterile technique with sterile gowns and gloves were used for the procedure. A timeout was performed prior to the initiation of the procedure. After creating a small venotomy incision, a  micropuncture kit was utilized to access the right internal jugular vein under direct, real-time ultrasound guidance after the overlying soft tissues were anesthetized with 1% lidocaine with epinephrine. Ultrasound image documentation was performed. The microwire was kinked to measure appropriate catheter length. A stiff Glidewire was advanced to the level of the IVC and the micropuncture sheath was exchanged for a peel-away sheath. A palindrome tunneled hemodialysis catheter measuring 23 cm from tip to cuff was tunneled in a retrograde fashion from the anterior chest wall to the venotomy incision. The catheter was then placed through the peel-away sheath with tips ultimately positioned within the superior aspect of the right atrium. Final catheter positioning was confirmed and documented with a spot radiographic image. The catheter aspirates and flushes normally. The catheter was flushed with appropriate volume heparin dwells. The catheter exit site was secured with a 0-Prolene retention suture. The venotomy incision was closed with Dermabond. Dressings were applied. The patient tolerated the procedure well without immediate post procedural complication. IMPRESSION: Successful placement of 23 cm tip to cuff tunneled hemodialysis catheter via the right internal jugular vein with tips terminating within the superior aspect of the right atrium. The catheter is ready for immediate use.  The previously placed temporary right IJ catheter was removed. Electronically Signed   By: Jacqulynn Cadet M.D.   On: 01/21/2020 11:58   IR US Guide Vasc Access Right  Result Date: 01/21/2020 INDICATION: 69 year old male with end-stage renal disease. He was dialyzing via a left upper extremity arteriovenous fistula but suffered in extravasation event resulting in hematoma formation in the arm and is now dialyzing via a temporary IJ catheter on the right. EXAM: Fistulagram MEDICATIONS: None. ANESTHESIA/SEDATION: None FLUOROSCOPY  TIME:  Fluoroscopy Time: 1 minutes 18 seconds (13 mGy). COMPLICATIONS: None immediate. PROCEDURE: Informed written consent was obtained from the patient after a thorough discussion of the procedural risks, benefits and alternatives. All questions were addressed. Maximal Sterile Barrier Technique was utilized including caps, mask, sterile gowns, sterile gloves, sterile drape, hand hygiene and skin antiseptic. A timeout was performed prior to the initiation of the procedure. The left upper extremity AV fistula was interrogated with ultrasound and found to be widely patent. An image was obtained and stored for the medical record. Local anesthesia was attained by infiltration with 1% lidocaine. A small dermatotomy was made. Under real-time sonographic guidance, the vessel was punctured with a 21 gauge micropuncture needle. Using standard technique, the initial micro needle was exchanged over a 0.018 micro wire for a transitional 4 [place] micro sheath. A diagnostic fistulagram was then performed. This is a left upper extremity brachiocephalic arteriovenous fistula. There are multiple side branches with some cross-filling between the cephalic vein and the deeper brachial basilic system. Mild tortuosity present at the cephalic arch. However, there is not appear to be a hemodynamically significant stenosis or evidence of clot. The central veins are widely patent. The arterial anastomosis also appears widely patent. Normal palpable thrill. Sonographic evaluation of the upper extremity does demonstrate a moderately large subcutaneous hematoma overlying a portion of the access zone. IMPRESSION: 1. Left upper extremity brachiocephalic arteriovenous fistula without evidence of significant stenosis or occlusion. 2. There are multiple small side branches but these do not appear to limit flow or function. 3. Superficial subcutaneous hematoma in the upper arm overlying a portion of the access zone may result in temporarily difficult  cannulation. ACCESS: This access remains amenable to future percutaneous interventions as clinically indicated. Electronically Signed   By: Jacqulynn Cadet M.D.   On: 01/21/2020 13:52   IR DIALY SHUNT INTRO NEEDLE/INTRACATH INITIAL W/IMG LEFT  Result Date: 01/21/2020 INDICATION: 69 year old male with end-stage renal disease. He was dialyzing via a left upper extremity arteriovenous fistula but suffered in extravasation event resulting in hematoma formation in the arm and is now dialyzing via a temporary IJ catheter on the right. EXAM: Fistulagram MEDICATIONS: None. ANESTHESIA/SEDATION: None FLUOROSCOPY TIME:  Fluoroscopy Time: 1 minutes 18 seconds (13 mGy). COMPLICATIONS: None immediate. PROCEDURE: Informed written consent was obtained from the patient after a thorough discussion of the procedural risks, benefits and alternatives. All questions were addressed. Maximal Sterile Barrier Technique was utilized including caps, mask, sterile gowns, sterile gloves, sterile drape, hand hygiene and skin antiseptic. A timeout was performed prior to the initiation of the procedure. The left upper extremity AV fistula was interrogated with ultrasound and found to be widely patent. An image was obtained and stored for the medical record. Local anesthesia was attained by infiltration with 1% lidocaine. A small dermatotomy was made. Under real-time sonographic guidance, the vessel was punctured with a 21 gauge micropuncture needle. Using standard technique, the initial micro needle was exchanged over a 0.018 micro wire for a transitional 4  [place] micro sheath. A diagnostic fistulagram was then performed. This is a left upper extremity brachiocephalic arteriovenous fistula. There are multiple side branches with some cross-filling between the cephalic vein and the deeper brachial basilic system. Mild tortuosity present at the cephalic arch. However, there is not appear to be a hemodynamically significant stenosis or evidence  of clot. The central veins are widely patent. The arterial anastomosis also appears widely patent. Normal palpable thrill. Sonographic evaluation of the upper extremity does demonstrate a moderately large subcutaneous hematoma overlying a portion of the access zone. IMPRESSION: 1. Left upper extremity brachiocephalic arteriovenous fistula without evidence of significant stenosis or occlusion. 2. There are multiple small side branches but these do not appear to limit flow or function. 3. Superficial subcutaneous hematoma in the upper arm overlying a portion of the access zone may result in temporarily difficult cannulation. ACCESS: This access remains amenable to future percutaneous interventions as clinically indicated. Electronically Signed   By: Jacqulynn Cadet M.D.   On: 01/21/2020 13:52   Medications: . sodium chloride 10 mL/hr at 01/13/20 1700  . vancomycin Stopped (01/19/20 1432)   . (feeding supplement) PROSource Plus  30 mL Oral BID BM  . amLODipine  10 mg Oral Daily  . atorvastatin  20 mg Oral QHS  . calcitRIOL  1.25 mcg Oral Once per day on Mon Wed Fri  . chlorhexidine gluconate (MEDLINE KIT)  15 mL Mouth Rinse BID  . Chlorhexidine Gluconate Cloth  6 each Topical Q0600  . darbepoetin (ARANESP) injection - DIALYSIS  100 mcg Intravenous Q Mon-HD  . enoxaparin (LOVENOX) injection  30 mg Subcutaneous Q24H  . feeding supplement (NEPRO CARB STEADY)  237 mL Oral QHS  . HYDROcodone-acetaminophen  1 tablet Oral Q6H  . insulin aspart  0-9 Units Subcutaneous Q4H  . insulin detemir  11 Units Subcutaneous BID  . lidocaine  1 patch Transdermal Q24H  . metoprolol tartrate  25 mg Oral BID  . multivitamin  1 tablet Oral QHS  . QUEtiapine  12.5 mg Oral QHS  . rifampin  300 mg Oral Q12H  . sevelamer carbonate  1,600 mg Oral TID WC  . sodium chloride flush  10-40 mL Intracatheter Q12H    Dialysis Orders: [facility] MWF 4h 84mn 99kg 2/2 bath LUA AVF Hep none - Calcitriol 1.249m PO q HD -  Mircera 150 mcg IV q 2 weeks (last given on 12/29/19)  Problem/Plan: 1. 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  2. MRSA bacteremia:Repeat BCx 8/6 negative. TTE 8/6 without vegetation. TDC removed 8/6, using LUE AVF until infiltrated 8/9,wasusing temp HD cath. On IV vanc and PO Rifampin per ID.  3. Resp failure: S/p intubation/vent8/9 -8/12. Now off vent - on room air. 4. Confusion: Likely uremic, as resolved w/ dialysis. Still with some memory issues - ?chronic. 5. ESRD: Usual MWF sched. Being dialyzed today as "roll-over" from yesterday. 6. Infiltrated AVUDJ:SHFWYOVZCannulation on 8/16 however able to get it. Had to use AVF 1:1 on 8/18. IR consulted, [facility]. F'gram 8/20 without stenosis, just the hematoma. Actually was able to use AVF today for HD. 7. HTN/volume: BPand edema improving. He is down > 10kg from admit. 8. Anemia: Hgb7.5. S/p 1U PRBCs 8/15. Aranesp 10049m(^ dose) given 8/16.  Follow trend. 9. Secondary hyperparathyroidism:CorrCa ok, Phos high. Continue Renvela 1600m64mD + VDRA. 10. Nutrition: Alb very low 1.9 - continue protein  supplements. 11. Dispo: Anticipating d/c to SNF.  Veneta Penton, PA-C 01/22/2020, 10:58 AM  [employer]

## 2020-01-22 NOTE — TOC Progression Note (Addendum)
Transition of Care (TOC) - Progression Note    Patient Details  Name: Dougles Kimmey. MRN: [id number] Date of Birth: 07-23-1950  Transition of Care [facility]) CM/SW [place], [place] Phone Number: 01/22/2020, 2:51 PM  Clinical Narrative:    CSW spoke with Juliann Pulse at [employer].  Pt can be go to facility on Monday after receiving dialysis.  Physician has been updated on information. Facility will set up transport for Wednesday dialysis with [facility] transportation.  TOC Team will continue to assist with discharging planning.   Expected Discharge Plan: Skilled Nursing Facility Barriers to Discharge: Continued Medical Work up  Expected Discharge Plan and Services Expected Discharge Plan: [place]   Discharge Planning Services: CM Consult Post Acute Care Choice: [place]                                         Social Determinants of Health (SDOH) Interventions    Readmission Risk Interventions Readmission Risk Prevention Plan 01/18/2020  Transportation Screening Complete  Medication Review Press photographer) Complete  PCP or Specialist appointment within 3-5 days of discharge Complete  HRI or Home Care Consult Complete  SW Recovery Care/Counseling Consult Complete  Palliative Care Screening Complete  Skilled Nursing Facility Complete  Some recent data might be hidden

## 2020-01-23 ENCOUNTER — Encounter (HOSPITAL_COMMUNITY): Payer: Self-pay | Admitting: Internal Medicine

## 2020-01-23 LAB — RENAL FUNCTION PANEL
Albumin: 1.8 g/dL — ABNORMAL LOW (ref 3.5–5.0)
Anion gap: 13 (ref 5–15)
BUN: 29 mg/dL — ABNORMAL HIGH (ref 8–23)
CO2: 24 mmol/L (ref 22–32)
Calcium: 7.7 mg/dL — ABNORMAL LOW (ref 8.9–10.3)
Chloride: 97 mmol/L — ABNORMAL LOW (ref 98–111)
Creatinine, Ser: 8.52 mg/dL — ABNORMAL HIGH (ref 0.61–1.24)
GFR calc Af Amer: 7 mL/min — ABNORMAL LOW (ref >60)
GFR calc non Af Amer: 6 mL/min — ABNORMAL LOW (ref >60)
Glucose, Bld: 108 mg/dL — ABNORMAL HIGH (ref 70–99)
Phosphorus: 5.2 mg/dL — ABNORMAL HIGH (ref 2.5–4.6)
Potassium: 3.6 mmol/L (ref 3.5–5.1)
Sodium: 134 mmol/L — ABNORMAL LOW (ref 135–145)

## 2020-01-23 LAB — GLUCOSE, CAPILLARY
Glucose-Capillary: 82 mg/dL (ref 70–99)
Glucose-Capillary: 105 mg/dL — ABNORMAL HIGH (ref 70–99)
Glucose-Capillary: 100 mg/dL — ABNORMAL HIGH (ref 70–99)
Glucose-Capillary: 118 mg/dL — ABNORMAL HIGH (ref 70–99)

## 2020-01-23 LAB — SARS CORONAVIRUS 2 (TAT 6-24 HRS): SARS Coronavirus 2: NEGATIVE

## 2020-01-23 NOTE — Progress Notes (Signed)
PROGRESS NOTE    [name].  ZOX:[id number] DOB: 1951/05/24 DOA: 01/04/2020 PCP: [facility]    Brief Narrative:  [name]. is an 69 y.o. male  veteran withhistory of ESRD on hemodialysis on Monday Wednesday Friday, hypertension, diabetes mellitus type 2, anemia history of colon cancer status post resection has been having increasing headache back pain and found to have epidural abscess and is s/p laminectomy and MRSA septicemia.  Required intubation due to AMS.     Assessment & Plan:   Principal Problem:   MRSA bacteremia Active Problems:   TOBACCO ABUSE   OBSTRUCTIVE SLEEP APNEA   Colon cancer, status Post resection    HYPERTENSION   ESRD (end stage renal disease) ([place])   Epidural abscess   Acute encephalopathy   Sepsis ([place])   Bacteremia associated with intravascular line ([place])   Hemodialysis patient ([place])   Acute hypoxic respiratory failure with compromised airwaysecondary to acute encephalopathy Patient was briefly intubated requiring ventilatory support on 01/11/2020 and successfully extubated on 01/13/2020.  Oxygenating well on room air.  Acute metabolic toxic encephalopathy: short term memory seems to be impaired and I have concerns about his ability to make his own medical decisions currently CT Head and MRI brain negative for acute intracranial abnormalities; Suspect to be secondary to sepsis vs substance abuse and doubt uremia currently.He had HD sessions per MWF schedule with improvement in mental status. --Continue seroquel qHS for sundowning --Continue high-dose thiamine  MRSA bacteremia  Epidural abscess s/p laminectomy and fusion: --WBC 25>>>9.5 --ESR 115>126 --CRP 17.9>12.8 --Continued on vancomycin q M/W/F w/ HD and rifampin 312m PO BID x 8 weeks total per ID; through October 1 per ID --Will need weekly LFTs on rifampin; weekly ESR/CRP --Vanco level per pharmacy --Outpatient appointment with ID on September 2 at 11 AM with  SJanene Madeira NP  ESRD on HD: Patient continues to have issues regarding his AV fistula requiring use of a temporary right IJ catheter placed by PCCM on 01/10/2020.  Underwent left upper extremity fistulogram with no significant stenosis or occlusion with superficial subcutaneous hematoma in the upper arm overlying portion of the access which could result in temporary difficult cannulation.  IR also placed a tunneled HD catheter on 01/21/2020 --Continue HD per nephrology  Anemia of chronic disease: No signs of active bleeding.Suspect secondary to acute illness and anemia of chronic disease.  Received 1 unit PRBC on 01/16/2020. --Hgb 7.9>7.1>7.5; continue transfusions as needed with HD --aranesp per nephrology  Type 2 DM with hyperglycemia Hemoglobin A1c 6.5 on 01/12/2020, well controlled --Levemir 11u [place] daily --Continue insulin sliding scale for further coverage --CBGs before every meal/at bedtime --Has been intermittently refusing medications and lab testing/CBG checks  Alcohol abuse/cocaine abuse --encourage cessation --thiamine high dose x 3 days   Essential hypertension Continue metoprolol tartrate 25 mg p.o. twice daily, Amlodipine 10 mg p.o. daily  Hyperlipidemia: Continue atorvastatin 20 g p.o. daily   DVT prophylaxis: Lovenox ordered, but refuses intermittently Code Status: Full code Family Communication: No family present at bedside  Disposition Plan:  Status is: Inpatient  Remains inpatient appropriate because:Altered mental status, Unsafe d/c plan and IV treatments appropriate due to intensity of illness or inability to take PO   Dispo: The patient is from: Home              [facility]             Anticipated d/c date is: 1  day, plan discharge to [place] and rehab on 01/24/2020              Patient currently is medically stable to d/c.   Consultants:   PCCM  Nephrology  Infectious disease  Procedures:     Intubation 8/10, extubated 01/13/2020  Right IJ nontunneled HD catheter, 8/9 - 8/20  Right IJ tunneled HD catheter, 8/20  IR fistulogram, 8/20  Antimicrobials:   Rifampin  Vancomycin   Subjective: Patient seen and examined bedside, resting comfortably. Requests ice water to drink this morning, otherwise no other complaints. Denies headache, no visual changes, no chest pain, no palpitations, no shortness of breath, no abdominal pain.  Nursing concerned about his demeanor and refusal of medical therapy, otherwise no acute events overnight per nursing staff.  Objective: Vitals:   01/22/20 1603 01/22/20 1914 01/23/20 0541 01/23/20 0700  BP: (!) 142/66 (!) 144/69 (!) 151/66 (!) 148/64  Pulse: 85 87 86 87  Resp: '17 17 18 18  ' Temp: 99.5 F (37.5 C) 98.9 F (37.2 C) 98.3 F (36.8 C) 98.3 F (36.8 C)  TempSrc: Oral Oral  Oral  SpO2: 100% 100% 100% 100%  Weight:      Height:        Intake/Output Summary (Last 24 hours) at 01/23/2020 1349 Last data filed at 01/23/2020 1100 Gross per 24 hour  Intake 540 ml  Output --  Net 540 ml   Filed Weights   01/22/20 0500 01/22/20 0830 01/22/20 1142  Weight: 87.9 kg 88 kg 86.5 kg    Examination:  General exam: Appears calm and comfortable, irritable Respiratory system: Clear to auscultation. Respiratory effort normal. Cardiovascular system: S1 & S2 heard, RRR. No JVD, murmurs, rubs, gallops or clicks. No pedal edema.  Right IJ HD catheter noted Gastrointestinal system: Abdomen is nondistended, soft and nontender. No organomegaly or masses felt. Normal bowel sounds heard. Central nervous system: Alert and oriented. No focal neurological deficits. Extremities: Symmetric 5 x 5 power. Skin: No rashes, lesions or ulcers Psychiatry: Judgement and insight appear poor. Mood & affect appropriate.     Data Reviewed: I have personally reviewed following labs and imaging studies  CBC: Recent Labs  Lab 01/17/20 0349 01/19/20 0332  01/21/20 0401 01/22/20 0345  WBC 10.6* 8.9 11.0* 9.5  HGB 7.7* 7.9* 7.1* 7.5*  HCT 23.5* 23.8* 21.8* 22.9*  MCV 81.9 82.6 83.5 83.0  PLT 220 204 250 161   Basic Metabolic Panel: Recent Labs  Lab 01/19/20 0332 01/20/20 0435 01/21/20 0401 01/22/20 0345 01/23/20 0750  NA 132* 133* 135 134* 134*  K 4.0 3.6 4.0 4.3 3.6  CL 95* 96* 98 98 97*  CO2 '26 26 25 ' 20* 24  GLUCOSE 93 131* 140* 106* 108*  BUN 42* 23 35* 45* 29*  CREATININE 9.05* 6.83* 9.27* 11.41* 8.52*  CALCIUM 7.4* 7.4* 7.5* 7.5* 7.7*  PHOS 6.3* 4.5 6.0* 6.7* 5.2*   GFR: Estimated Creatinine Clearance: 9 mL/min (A) (by C-G formula based on SCr of 8.52 mg/dL (H)). Liver Function Tests: Recent Labs  Lab 01/19/20 0332 01/20/20 0435 01/21/20 0401 01/22/20 0345 01/23/20 0750  AST  --  27 35  --   --   ALT  --  17 21  --   --   ALKPHOS  --  95 102  --   --   BILITOT  --  1.2 0.8  --   --   PROT  --  7.0 6.9  --   --  ALBUMIN 1.7* 1.9* 1.8* 1.9* 1.8*   No results for input(s): LIPASE, AMYLASE in the last 168 hours. No results for input(s): AMMONIA in the last 168 hours. Coagulation Profile: No results for input(s): INR, PROTIME in the last 168 hours. Cardiac Enzymes: No results for input(s): CKTOTAL, CKMB, CKMBINDEX, TROPONINI in the last 168 hours. BNP (last 3 results) No results for input(s): PROBNP in the last 8760 hours. HbA1C: No results for input(s): HGBA1C in the last 72 hours. CBG: Recent Labs  Lab 01/22/20 1241 01/22/20 1935 01/23/20 0330 01/23/20 0810 01/23/20 1200  GLUCAP 117* 80 82 105* 100*   Lipid Profile: No results for input(s): CHOL, HDL, LDLCALC, TRIG, CHOLHDL, LDLDIRECT in the last 72 hours. Thyroid Function Tests: No results for input(s): TSH, T4TOTAL, FREET4, T3FREE, THYROIDAB in the last 72 hours. Anemia Panel: No results for input(s): VITAMINB12, FOLATE, FERRITIN, TIBC, IRON, RETICCTPCT in the last 72 hours. Sepsis Labs: No results for input(s): PROCALCITON, LATICACIDVEN in the  last 168 hours.  No results found for this or any previous visit (from the past 240 hour(s)).       Radiology Studies: No results found.      Scheduled Meds: . (feeding supplement) PROSource Plus  30 mL Oral BID BM  . amLODipine  10 mg Oral Daily  . atorvastatin  20 mg Oral QHS  . calcitRIOL  1.25 mcg Oral Once per day on Mon Wed Fri  . chlorhexidine gluconate (MEDLINE KIT)  15 mL Mouth Rinse BID  . Chlorhexidine Gluconate Cloth  6 each Topical Q0600  . darbepoetin (ARANESP) injection - DIALYSIS  100 mcg Intravenous Q Mon-HD  . enoxaparin (LOVENOX) injection  30 mg Subcutaneous Q24H  . feeding supplement (NEPRO CARB STEADY)  237 mL Oral QHS  . HYDROcodone-acetaminophen  1 tablet Oral Q6H  . insulin aspart  0-9 Units Subcutaneous Q4H  . insulin detemir  11 Units Subcutaneous BID  . lidocaine  1 patch Transdermal Q24H  . metoprolol tartrate  25 mg Oral BID  . multivitamin  1 tablet Oral QHS  . QUEtiapine  12.5 mg Oral QHS  . rifampin  300 mg Oral Q12H  . sevelamer carbonate  1,600 mg Oral TID WC  . sodium chloride flush  10-40 mL Intracatheter Q12H   Continuous Infusions: . sodium chloride 10 mL/hr at 01/23/20 0144  . vancomycin Stopped (01/19/20 1432)     LOS: 19 days    Time spent: 35 minutes spent on chart review, discussion with nursing staff, consultants, updating family and interview/physical exam; more than 50% of that time was spent in counseling and/or coordination of care.    Felipe Paluch J [place] DO [facility] Available via Epic secure chat 7am-7pm After these hours, please refer to coverage provider listed on [email or web address] 01/23/2020, 1:49 PM

## 2020-01-23 NOTE — Progress Notes (Signed)
Pt is refusing all night time medications at this time. Vital signs stable, no SOB, no pain or discomfort. Bedtime diabetic snack given with drink. Will continue to monitor.

## 2020-01-23 NOTE — Plan of Care (Signed)
  Problem: Education: Goal: Knowledge of General Education information will improve Description: Including pain rating scale, medication(s)/side effects and non-pharmacologic comfort measures Outcome: Not Progressing   Problem: Activity: Goal: Risk for activity intolerance will decrease Outcome: Not Progressing   Problem: Safety: Goal: Ability to remain free from injury will improve Outcome: Not Progressing

## 2020-01-23 NOTE — Progress Notes (Signed)
[facility] Progress Note   Subjective:  Seen in room - fidgety today, says not sleeping well but that it is a chronic issue. Denies CP or dyspnea. He is asking when he will get staples in neck removed - confirmed with the hospitalist that it will be at his neurosurgery follow-up appt on 9/2.  Objective Vitals:   01/22/20 1603 01/22/20 1914 01/23/20 0541 01/23/20 0700  BP: (!) 142/66 (!) 144/69 (!) 151/66 (!) 148/64  Pulse: 85 87 86 87  Resp: '17 17 18 18  ' Temp: 99.5 F (37.5 C) 98.9 F (37.2 C) 98.3 F (36.8 C) 98.3 F (36.8 C)  TempSrc: Oral Oral  Oral  SpO2: 100% 100% 100% 100%  Weight:      Height:       Physical Exam General: Chronically ill man, NAD but restless Head: Staples to posterior scalp/neck - intact Heart: RRR; no murmur Lungs: CTA anteriorly Abdomen: soft, non-tender Extremities: No LE edema Dialysis Access: LUE AVF + bruit  Additional Objective Labs: Basic Metabolic Panel: Recent Labs  Lab 01/21/20 0401 01/22/20 0345 01/23/20 0750  NA 135 134* 134*  K 4.0 4.3 3.6  CL 98 98 97*  CO2 25 20* 24  GLUCOSE 140* 106* 108*  BUN 35* 45* 29*  CREATININE 9.27* 11.41* 8.52*  CALCIUM 7.5* 7.5* 7.7*  PHOS 6.0* 6.7* 5.2*   Liver Function Tests: Recent Labs  Lab 01/20/20 0435 01/20/20 0435 01/21/20 0401 01/22/20 0345 01/23/20 0750  AST 27  --  35  --   --   ALT 17  --  21  --   --   ALKPHOS 95  --  102  --   --   BILITOT 1.2  --  0.8  --   --   PROT 7.0  --  6.9  --   --   ALBUMIN 1.9*   < > 1.8* 1.9* 1.8*   < > = values in this interval not displayed.   CBC: Recent Labs  Lab 01/17/20 0349 01/17/20 0349 01/19/20 0332 01/21/20 0401 01/22/20 0345  WBC 10.6*   < > 8.9 11.0* 9.5  HGB 7.7*   < > 7.9* 7.1* 7.5*  HCT 23.5*   < > 23.8* 21.8* 22.9*  MCV 81.9  --  82.6 83.5 83.0  PLT 220   < > 204 250 234   < > = values in this interval not displayed.   CBG: Recent Labs  Lab 01/22/20 0806 01/22/20 1241 01/22/20 1935  01/23/20 0330 01/23/20 0810  GLUCAP 113* 117* 80 82 105*   Studies/Results: IR Fluoro Guide CV Line Right  Result Date: 01/21/2020 INDICATION: 69 year old male with end-stage renal disease on hemodialysis via a left upper extremity brachiocephalic arteriovenous fistula. He recently had an extravasation event with hematoma formation making it difficult to access the left upper extremity fistula. He currently has a temporary IJ hemodialysis catheter on the right but needs a more durable access in till his fistula can again be used. EXAM: TUNNELED CENTRAL VENOUS HEMODIALYSIS CATHETER PLACEMENT WITH ULTRASOUND AND FLUOROSCOPIC GUIDANCE MEDICATIONS: 2 g Ancef. The antibiotic was given in an appropriate time interval prior to skin puncture. ANESTHESIA/SEDATION: None. FLUOROSCOPY TIME:  Fluoroscopy Time: 1 minutes 18 seconds (13 mGy). COMPLICATIONS: None immediate. PROCEDURE: Informed written consent was obtained from the patient after a discussion of the risks, benefits, and alternatives to treatment. Questions regarding the procedure were encouraged and answered. The right neck and chest were prepped with chlorhexidine in a sterile  fashion, and a sterile drape was applied covering the operative field. Maximum barrier sterile technique with sterile gowns and gloves were used for the procedure. A timeout was performed prior to the initiation of the procedure. After creating a small venotomy incision, a micropuncture kit was utilized to access the right internal jugular vein under direct, real-time ultrasound guidance after the overlying soft tissues were anesthetized with 1% lidocaine with epinephrine. Ultrasound image documentation was performed. The microwire was kinked to measure appropriate catheter length. A stiff Glidewire was advanced to the level of the IVC and the micropuncture sheath was exchanged for a peel-away sheath. A palindrome tunneled hemodialysis catheter measuring 23 cm from tip to cuff was  tunneled in a retrograde fashion from the anterior chest wall to the venotomy incision. The catheter was then placed through the peel-away sheath with tips ultimately positioned within the superior aspect of the right atrium. Final catheter positioning was confirmed and documented with a spot radiographic image. The catheter aspirates and flushes normally. The catheter was flushed with appropriate volume heparin dwells. The catheter exit site was secured with a 0-Prolene retention suture. The venotomy incision was closed with Dermabond. Dressings were applied. The patient tolerated the procedure well without immediate post procedural complication. IMPRESSION: Successful placement of 23 cm tip to cuff tunneled hemodialysis catheter via the right internal jugular vein with tips terminating within the superior aspect of the right atrium. The catheter is ready for immediate use. The previously placed temporary right IJ catheter was removed. Electronically Signed   By: Jacqulynn Cadet M.D.   On: 01/21/2020 11:58   IR US Guide Vasc Access Left  Result Date: 01/21/2020 INDICATION: 69 year old male with end-stage renal disease on hemodialysis via a left upper extremity brachiocephalic arteriovenous fistula. He recently had an extravasation event with hematoma formation making it difficult to access the left upper extremity fistula. He currently has a temporary IJ hemodialysis catheter on the right but needs a more durable access in till his fistula can again be used. EXAM: TUNNELED CENTRAL VENOUS HEMODIALYSIS CATHETER PLACEMENT WITH ULTRASOUND AND FLUOROSCOPIC GUIDANCE MEDICATIONS: 2 g Ancef. The antibiotic was given in an appropriate time interval prior to skin puncture. ANESTHESIA/SEDATION: None. FLUOROSCOPY TIME:  Fluoroscopy Time: 1 minutes 18 seconds (13 mGy). COMPLICATIONS: None immediate. PROCEDURE: Informed written consent was obtained from the patient after a discussion of the risks, benefits, and  alternatives to treatment. Questions regarding the procedure were encouraged and answered. The right neck and chest were prepped with chlorhexidine in a sterile fashion, and a sterile drape was applied covering the operative field. Maximum barrier sterile technique with sterile gowns and gloves were used for the procedure. A timeout was performed prior to the initiation of the procedure. After creating a small venotomy incision, a micropuncture kit was utilized to access the right internal jugular vein under direct, real-time ultrasound guidance after the overlying soft tissues were anesthetized with 1% lidocaine with epinephrine. Ultrasound image documentation was performed. The microwire was kinked to measure appropriate catheter length. A stiff Glidewire was advanced to the level of the IVC and the micropuncture sheath was exchanged for a peel-away sheath. A palindrome tunneled hemodialysis catheter measuring 23 cm from tip to cuff was tunneled in a retrograde fashion from the anterior chest wall to the venotomy incision. The catheter was then placed through the peel-away sheath with tips ultimately positioned within the superior aspect of the right atrium. Final catheter positioning was confirmed and documented with a spot radiographic image. The catheter  aspirates and flushes normally. The catheter was flushed with appropriate volume heparin dwells. The catheter exit site was secured with a 0-Prolene retention suture. The venotomy incision was closed with Dermabond. Dressings were applied. The patient tolerated the procedure well without immediate post procedural complication. IMPRESSION: Successful placement of 23 cm tip to cuff tunneled hemodialysis catheter via the right internal jugular vein with tips terminating within the superior aspect of the right atrium. The catheter is ready for immediate use. The previously placed temporary right IJ catheter was removed. Electronically Signed   By: Jacqulynn Cadet  M.D.   On: 01/21/2020 11:58   IR US Guide Vasc Access Right  Result Date: 01/21/2020 INDICATION: 69 year old male with end-stage renal disease. He was dialyzing via a left upper extremity arteriovenous fistula but suffered in extravasation event resulting in hematoma formation in the arm and is now dialyzing via a temporary IJ catheter on the right. EXAM: Fistulagram MEDICATIONS: None. ANESTHESIA/SEDATION: None FLUOROSCOPY TIME:  Fluoroscopy Time: 1 minutes 18 seconds (13 mGy). COMPLICATIONS: None immediate. PROCEDURE: Informed written consent was obtained from the patient after a thorough discussion of the procedural risks, benefits and alternatives. All questions were addressed. Maximal Sterile Barrier Technique was utilized including caps, mask, sterile gowns, sterile gloves, sterile drape, hand hygiene and skin antiseptic. A timeout was performed prior to the initiation of the procedure. The left upper extremity AV fistula was interrogated with ultrasound and found to be widely patent. An image was obtained and stored for the medical record. Local anesthesia was attained by infiltration with 1% lidocaine. A small dermatotomy was made. Under real-time sonographic guidance, the vessel was punctured with a 21 gauge micropuncture needle. Using standard technique, the initial micro needle was exchanged over a 0.018 micro wire for a transitional 4 [place] micro sheath. A diagnostic fistulagram was then performed. This is a left upper extremity brachiocephalic arteriovenous fistula. There are multiple side branches with some cross-filling between the cephalic vein and the deeper brachial basilic system. Mild tortuosity present at the cephalic arch. However, there is not appear to be a hemodynamically significant stenosis or evidence of clot. The central veins are widely patent. The arterial anastomosis also appears widely patent. Normal palpable thrill. Sonographic evaluation of the upper extremity does demonstrate  a moderately large subcutaneous hematoma overlying a portion of the access zone. IMPRESSION: 1. Left upper extremity brachiocephalic arteriovenous fistula without evidence of significant stenosis or occlusion. 2. There are multiple small side branches but these do not appear to limit flow or function. 3. Superficial subcutaneous hematoma in the upper arm overlying a portion of the access zone may result in temporarily difficult cannulation. ACCESS: This access remains amenable to future percutaneous interventions as clinically indicated. Electronically Signed   By: Jacqulynn Cadet M.D.   On: 01/21/2020 13:52   IR DIALY SHUNT INTRO NEEDLE/INTRACATH INITIAL W/IMG LEFT  Result Date: 01/21/2020 INDICATION: 69 year old male with end-stage renal disease. He was dialyzing via a left upper extremity arteriovenous fistula but suffered in extravasation event resulting in hematoma formation in the arm and is now dialyzing via a temporary IJ catheter on the right. EXAM: Fistulagram MEDICATIONS: None. ANESTHESIA/SEDATION: None FLUOROSCOPY TIME:  Fluoroscopy Time: 1 minutes 18 seconds (13 mGy). COMPLICATIONS: None immediate. PROCEDURE: Informed written consent was obtained from the patient after a thorough discussion of the procedural risks, benefits and alternatives. All questions were addressed. Maximal Sterile Barrier Technique was utilized including caps, mask, sterile gowns, sterile gloves, sterile drape, hand hygiene and skin antiseptic. A timeout  was performed prior to the initiation of the procedure. The left upper extremity AV fistula was interrogated with ultrasound and found to be widely patent. An image was obtained and stored for the medical record. Local anesthesia was attained by infiltration with 1% lidocaine. A small dermatotomy was made. Under real-time sonographic guidance, the vessel was punctured with a 21 gauge micropuncture needle. Using standard technique, the initial micro needle was exchanged over  a 0.018 micro wire for a transitional 4 [place] micro sheath. A diagnostic fistulagram was then performed. This is a left upper extremity brachiocephalic arteriovenous fistula. There are multiple side branches with some cross-filling between the cephalic vein and the deeper brachial basilic system. Mild tortuosity present at the cephalic arch. However, there is not appear to be a hemodynamically significant stenosis or evidence of clot. The central veins are widely patent. The arterial anastomosis also appears widely patent. Normal palpable thrill. Sonographic evaluation of the upper extremity does demonstrate a moderately large subcutaneous hematoma overlying a portion of the access zone. IMPRESSION: 1. Left upper extremity brachiocephalic arteriovenous fistula without evidence of significant stenosis or occlusion. 2. There are multiple small side branches but these do not appear to limit flow or function. 3. Superficial subcutaneous hematoma in the upper arm overlying a portion of the access zone may result in temporarily difficult cannulation. ACCESS: This access remains amenable to future percutaneous interventions as clinically indicated. Electronically Signed   By: Jacqulynn Cadet M.D.   On: 01/21/2020 13:52   Medications: . sodium chloride 10 mL/hr at 01/23/20 0144  . vancomycin Stopped (01/19/20 1432)   . (feeding supplement) PROSource Plus  30 mL Oral BID BM  . amLODipine  10 mg Oral Daily  . atorvastatin  20 mg Oral QHS  . calcitRIOL  1.25 mcg Oral Once per day on Mon Wed Fri  . chlorhexidine gluconate (MEDLINE KIT)  15 mL Mouth Rinse BID  . Chlorhexidine Gluconate Cloth  6 each Topical Q0600  . darbepoetin (ARANESP) injection - DIALYSIS  100 mcg Intravenous Q Mon-HD  . enoxaparin (LOVENOX) injection  30 mg Subcutaneous Q24H  . feeding supplement (NEPRO CARB STEADY)  237 mL Oral QHS  . HYDROcodone-acetaminophen  1 tablet Oral Q6H  . insulin aspart  0-9 Units Subcutaneous Q4H  . insulin  detemir  11 Units Subcutaneous BID  . lidocaine  1 patch Transdermal Q24H  . metoprolol tartrate  25 mg Oral BID  . multivitamin  1 tablet Oral QHS  . QUEtiapine  12.5 mg Oral QHS  . rifampin  300 mg Oral Q12H  . sevelamer carbonate  1,600 mg Oral TID WC  . sodium chloride flush  10-40 mL Intracatheter Q12H    Dialysis Orders: [facility] MWF 4h 33mn 99kg 2/2 bath LUA AVF Hep none - Calcitriol 1.227m PO q HD - Mircera 150 mcg IV q 2 weeks (last given on 12/29/19)  Problem/Plan: 1. 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  2. MRSA bacteremia:Repeat BCx 8/6 negative. TTE 8/6 without vegetation. TDC removed 8/6, using LUE AVF until infiltrated 8/9,wasusing temp HD cath. On IV vanc and PO Rifampin per ID.  3. Resp failure: S/p intubation/vent8/9 -8/12. Now off vent - on room air. 4. Confusion: Likely uremic, as resolved w/ dialysis. Still with some memory issues - ?chronic. 5. ESRD: Usual MWF sched - next HD 8/23. 6. Infiltrated AVNKN:[id number] on 8/16 however able to get it. Had to use  AVF 1:1 on 8/18. IR consulted, s/p [facility] placement 8/20. F'gram 8/20 without stenosis, just the hematoma. Actually was able to use AVF on 8/21. 7. HTN/volume: BPand edema improving. He is down > 10kg from admit. 8. Anemia: Hgb7.5. S/p 1U PRBCs 8/15. Aranesp 177mg (^ dose) given 8/16.Follow trend. 9. Secondary hyperparathyroidism:CorrCa/Phos better. Continue Renvela 16021mTID + VDRA. 10. Nutrition: Alb very low1.8 - continue protein supplements. 11. Dispo: Anticipating d/c to SNF.   KaVeneta PentonPA-C 01/23/2020, 9:56 AM  [employer]

## 2020-01-24 LAB — GLUCOSE, CAPILLARY
Glucose-Capillary: 136 mg/dL — ABNORMAL HIGH (ref 70–99)
Glucose-Capillary: 223 mg/dL — ABNORMAL HIGH (ref 70–99)
Glucose-Capillary: 98 mg/dL (ref 70–99)
Glucose-Capillary: 118 mg/dL — ABNORMAL HIGH (ref 70–99)

## 2020-01-24 LAB — RENAL FUNCTION PANEL
Albumin: 1.9 g/dL — ABNORMAL LOW (ref 3.5–5.0)
Anion gap: 16 — ABNORMAL HIGH (ref 5–15)
BUN: 41 mg/dL — ABNORMAL HIGH (ref 8–23)
CO2: 22 mmol/L (ref 22–32)
Calcium: 7.8 mg/dL — ABNORMAL LOW (ref 8.9–10.3)
Chloride: 96 mmol/L — ABNORMAL LOW (ref 98–111)
Creatinine, Ser: 10.27 mg/dL — ABNORMAL HIGH (ref 0.61–1.24)
GFR calc Af Amer: 5 mL/min — ABNORMAL LOW (ref >60)
GFR calc non Af Amer: 5 mL/min — ABNORMAL LOW (ref >60)
Glucose, Bld: 109 mg/dL — ABNORMAL HIGH (ref 70–99)
Phosphorus: 6 mg/dL — ABNORMAL HIGH (ref 2.5–4.6)
Potassium: 3.8 mmol/L (ref 3.5–5.1)
Sodium: 134 mmol/L — ABNORMAL LOW (ref 135–145)

## 2020-01-24 LAB — CBC
HCT: 22.7 % — ABNORMAL LOW (ref 39.0–52.0)
Hemoglobin: 7.2 g/dL — ABNORMAL LOW (ref 13.0–17.0)
MCH: 26.2 pg (ref 26.0–34.0)
MCHC: 31.7 g/dL (ref 30.0–36.0)
MCV: 82.5 fL (ref 80.0–100.0)
Platelets: 231 10*3/uL (ref 150–400)
RBC: 2.75 MIL/uL — ABNORMAL LOW (ref 4.22–5.81)
RDW: 16.4 % — ABNORMAL HIGH (ref 11.5–15.5)
WBC: 7.7 10*3/uL (ref 4.0–10.5)
nRBC: 0 % (ref 0.0–0.2)

## 2020-01-24 MED ORDER — POLYETHYLENE GLYCOL 3350 17 G PO PACK: 17.0000 g | PACK | Freq: Every day | ORAL | 0 refills | Status: DC | PRN

## 2020-01-24 MED ORDER — VANCOMYCIN HCL IN DEXTROSE 750-5 MG/150ML-% IV SOLN
INTRAVENOUS | Status: AC
  Administered 2020-01-24: 750 mg via INTRAVENOUS
  Filled 2020-01-24: qty 150

## 2020-01-24 MED ORDER — DARBEPOETIN ALFA 100 MCG/0.5ML IJ SOSY: 100.0000 ug | PREFILLED_SYRINGE | INTRAMUSCULAR | Status: DC

## 2020-01-24 MED ORDER — RIFAMPIN 300 MG PO CAPS: 300.0000 mg | ORAL_CAPSULE | Freq: Two times a day (BID) | ORAL | 0 refills | Status: DC

## 2020-01-24 MED ORDER — QUETIAPINE FUMARATE 25 MG PO TABS: 12.5000 mg | ORAL_TABLET | Freq: Every day | ORAL | 0 refills | Status: DC

## 2020-01-24 MED ORDER — CALCITRIOL 0.25 MCG PO CAPS: 1.2500 ug | ORAL_CAPSULE | ORAL | 0 refills | Status: DC

## 2020-01-24 MED ORDER — HEPARIN SODIUM (PORCINE) 1000 UNIT/ML IJ SOLN
INTRAMUSCULAR | Status: AC
  Administered 2020-01-24: 1000 [IU]
  Filled 2020-01-24: qty 4

## 2020-01-24 MED ORDER — VANCOMYCIN HCL IN DEXTROSE 750-5 MG/150ML-% IV SOLN: 750.0000 mg | INTRAVENOUS | Status: DC

## 2020-01-24 MED ORDER — HYDROCODONE-ACETAMINOPHEN 5-325 MG PO TABS
1.0000 | ORAL_TABLET | Freq: Four times a day (QID) | ORAL | 0 refills | Status: DC
Start: 2020-01-24 — End: 2020-01-29

## 2020-01-24 MED ORDER — INSULIN DETEMIR 100 UNIT/ML ~~LOC~~ SOLN: 11.0000 [IU] | Freq: Two times a day (BID) | SUBCUTANEOUS | 0 refills | Status: DC

## 2020-01-24 MED ORDER — SEVELAMER CARBONATE 800 MG PO TABS: 1600.0000 mg | ORAL_TABLET | Freq: Three times a day (TID) | ORAL | 0 refills | Status: DC

## 2020-01-24 MED ORDER — HYDROCODONE-ACETAMINOPHEN 5-325 MG PO TABS
ORAL_TABLET | ORAL | Status: AC
  Administered 2020-01-24: 1 via ORAL
  Filled 2020-01-24: qty 1

## 2020-01-24 MED ORDER — CLONAZEPAM 0.5 MG PO TABS: 0.5000 mg | ORAL_TABLET | Freq: Two times a day (BID) | ORAL | 0 refills | Status: DC | PRN

## 2020-01-24 MED ORDER — DARBEPOETIN ALFA 100 MCG/0.5ML IJ SOSY
PREFILLED_SYRINGE | INTRAMUSCULAR | Status: AC
  Administered 2020-01-24: 100 ug via INTRAVENOUS
  Filled 2020-01-24: qty 0.5

## 2020-01-24 MED ORDER — ATORVASTATIN CALCIUM 20 MG PO TABS: 20.0000 mg | ORAL_TABLET | Freq: Every day | ORAL | 0 refills | Status: DC

## 2020-01-24 MED ORDER — METOPROLOL TARTRATE 25 MG PO TABS
25.0000 mg | ORAL_TABLET | Freq: Two times a day (BID) | ORAL | 0 refills | Status: DC
Start: 2020-01-24 — End: 2020-03-24

## 2020-01-24 MED ORDER — AMLODIPINE BESYLATE 10 MG PO TABS: 10.0000 mg | ORAL_TABLET | Freq: Every day | ORAL | 0 refills | Status: DC

## 2020-01-24 MED ORDER — RENA-VITE PO TABS: 1.0000 | ORAL_TABLET | Freq: Every day | ORAL | 0 refills | Status: DC

## 2020-01-24 NOTE — TOC Transition Note (Signed)
Transition of Care [facility]) - CM/SW Discharge Note   Patient Details  Name: [name]. MRN: [id number] Date of Birth: 1950/07/01  [facility] CM/SW Contact:  Varney Baas Phone Number: 01/24/2020, 10:18 AM   Clinical Narrative:    Patient will DC to: Field seismologist by: Corey Harold  RN, patient, patient's family, and facility notified of DC. Discharge Summary and FL2 sent to facility. RN to call report prior to discharge [phone] [id number]). DC packet on chart. Ambulance transport requested for patient.   CSW will sign off for now as social work intervention is no longer needed. Please consult [place] again if new needs arise.    Final next level of care: Skilled Nursing Facility Barriers to Discharge: No Barriers Identified   Patient Goals and CMS Choice Patient states their goals for this hospitalization and ongoing recovery are:: Patient plans to discharge to SNF for 8 weeks of IV antibiotics. CMS Medicare.gov Compare Post Acute Care list provided to:: Patient Represenative (must comment) Raymon Mutton) Choice offered to / list presented to : Adult Children  Discharge Placement              Patient [facility] bed at: [facility] Patient to be transferred to facility by: [place] Name of family member notified: Rajinder Mesick Patient and family notified of of transfer: 01/24/20  Discharge Plan and Services   Discharge Planning Services: CM Consult Post Acute Care Choice: [place]                               Social Determinants of Health (SDOH) Interventions     Readmission Risk Interventions Readmission Risk Prevention Plan 01/18/2020  Transportation Screening Complete  Medication Review ([place]) Complete  PCP or Specialist appointment within 3-5 days of discharge Complete  HRI or Home Care Consult Complete  SW Recovery Care/Counseling Consult Complete  Palliative Care Screening Complete   Skilled Nursing Facility Complete  Some recent data might be hidden

## 2020-01-24 NOTE — Progress Notes (Signed)
Pt with discharge orders, pt is to go to [facility], report called to Journalist, newspaper at [employer]. All questions answered, after visit summary placed in packet for receiving facility. IV in right hand removed. Vital signs stable. No complaints of pain. [facility] here to transport pt to facility.   Marijean Heath, Therapist, sports, BSN

## 2020-01-24 NOTE — Progress Notes (Addendum)
Subjective: Seen in room dialysis , noted to cannulate fistula today using PermCath.  Patient says he is awaiting nursing home placement, no complaints, noted evening nurse and refusing at meds, stressed compliance with patient  Objective Vital signs in last 24 hours: Vitals:   01/24/20 0354 01/24/20 0712 01/24/20 0722 01/24/20 0730  BP: 140/84 140/72 (!) 159/82 138/80  Pulse: 84 98 88 88  Resp: '16 18 18   ' Temp: 98.3 F (36.8 C) 98 F (36.7 C)    TempSrc: Axillary Oral    SpO2: 98%     Weight: 85.7 kg 90.8 kg    Height:       Weight change: -2.3 kg  Physical Exam General: Thin male, chronically appearing, NAD. Room air. Heart: RRR; no murmur Lungs: CTA anteriorly, nonlabored Abdomen: soft, non-tender, nondistended Extremities:nopedal edema Dialysis Access: L UAVF + bruit, some swelling around prox hematoma.  Right IJ PermCath patent on hemo   OP Dialysis Orders: [facility] MWF 4h 20mn 99kg 2/2 bath LUA AVF Hep none - Calcitriol 1.250m PO q HD - Mircera 150 mcg IV q 2 weeks (last given on 12/29/19)  Problem/Plan: 1. Epidural abscess:MRI= abscess extending from C4-T2 with cord compression, > NSurg  to OR on 8/5  C4-T2 for decompression laminectomy + fusion / awaiting rehab center placement, lives alone 2. MRSA bacteremia:Repeat BCx 8/6 negative. TTE 8/6 without vegetation. TDC removed 8/6, using LUE AVF until infiltrated 8/9,wasusing temp HD cath. On IV vanc and PO Rifampin per ID.  3. Resp failure: S/p intubation/vent8/9 -8/12. Now off vent - on room air. 4. Confusion: Likely uremic, now resolved w/ dialysis. Still with some memory issues - ?chronic./Some baseline dementia 5. ESRD: Continue HD on MWF schedule - 6. Infiltrated AVSKA:JGOTLXBWIannulation on 8/16 , Had to use AVF 1:1 on 8/18. IR fistulogram 8/20 no stenosis just hematoma then also  TDC/Actually was able to use AVF on 8/21.  But today they could only get the arterial needle and they ran him some while w/  one needle/ one catheter, then had to change to both catheter as the other needle gave out.  7. HTN/volume: BPand edema improving. He is down > 10kg from admit. 8. Anemia: Hgb7. 2 this a.m.. S/p 1U PRBCs 8/15. Aranesp 10046m(^ dose) given 8/16.  Follow-up trend 9. Secondary hyperparathyroidism:CorrCa/Phos ok. Continue Renvela 1600m38mD, resume VDRA. 10. Nutrition: Alb very low 1.8- continue protein supplements.  DaviErnest [name]-C [facility] Beeper [phone]/2021,8:53 AM  LOS: 20 days   Pt seen, examined and agree w assess/plan as above with additions as indicated.  [facility] 01/24/2020, 3:57 PM     Labs: Basic Metabolic Panel: Recent Labs  Lab 01/22/20 0345 01/23/20 0750 01/24/20 0500  NA 134* 134* 134*  K 4.3 3.6 3.8  CL 98 97* 96*  CO2 20* 24 22  GLUCOSE 106* 108* 109*  BUN 45* 29* 41*  CREATININE 11.41* 8.52* 10.27*  CALCIUM 7.5* 7.7* 7.8*  PHOS 6.7* 5.2* 6.0*   Liver Function Tests: Recent Labs  Lab 01/20/20 0435 01/20/20 0435 01/21/20 0401 01/21/20 0401 01/22/20 0345 01/23/20 0750 01/24/20 0500  AST 27  --  35  --   --   --   --   ALT 17  --  21  --   --   --   --   ALKPHOS 95  --  102  --   --   --   --   BILITOT 1.2  --  0.8  --   --   --   --   PROT 7.0  --  6.9  --   --   --   --   ALBUMIN 1.9*   < > 1.8*   < > 1.9* 1.8* 1.9*   < > = values in this interval not displayed.   No results for input(s): LIPASE, AMYLASE in the last 168 hours. No results for input(s): AMMONIA in the last 168 hours. CBC: Recent Labs  Lab 01/19/20 0332 01/19/20 0332 01/21/20 0401 01/22/20 0345 01/24/20 0612  WBC 8.9   < > 11.0* 9.5 7.7  HGB 7.9*   < > 7.1* 7.5* 7.2*  HCT 23.8*   < > 21.8* 22.9* 22.7*  MCV 82.6  --  83.5 83.0 82.5  PLT 204   < > 250 234 231   < > = values in this interval not displayed.   Cardiac Enzymes: No results for input(s): CKTOTAL, CKMB, CKMBINDEX, TROPONINI in the last 168  hours. CBG: Recent Labs  Lab 01/23/20 0330 01/23/20 0810 01/23/20 1200 01/23/20 1614 01/24/20 0340  GLUCAP 82 105* 100* 118* 136*    Studies/Results: No results found. Medications: . sodium chloride 10 mL/hr at 01/23/20 0144  . vancomycin Stopped (01/19/20 1432)   . (feeding supplement) PROSource Plus  30 mL Oral BID BM  . amLODipine  10 mg Oral Daily  . atorvastatin  20 mg Oral QHS  . calcitRIOL  1.25 mcg Oral Once per day on Mon Wed Fri  . chlorhexidine gluconate (MEDLINE KIT)  15 mL Mouth Rinse BID  . Chlorhexidine Gluconate Cloth  6 each Topical Q0600  . darbepoetin (ARANESP) injection - DIALYSIS  100 mcg Intravenous Q Mon-HD  . enoxaparin (LOVENOX) injection  30 mg Subcutaneous Q24H  . feeding supplement (NEPRO CARB STEADY)  237 mL Oral QHS  . HYDROcodone-acetaminophen  1 tablet Oral Q6H  . insulin aspart  0-9 Units Subcutaneous Q4H  . insulin detemir  11 Units Subcutaneous BID  . lidocaine  1 patch Transdermal Q24H  . metoprolol tartrate  25 mg Oral BID  . multivitamin  1 tablet Oral QHS  . QUEtiapine  12.5 mg Oral QHS  . rifampin  300 mg Oral Q12H  . sevelamer carbonate  1,600 mg Oral TID WC  . sodium chloride flush  10-40 mL Intracatheter Q12H

## 2020-01-24 NOTE — Discharge Summary (Signed)
Physician Discharge Summary  [name]. MBT:[id number] DOB: 1950-06-13 DOA: 01/04/2020  PCP: [facility]  Admit date: 01/04/2020 Discharge date: 01/24/2020  Admitted From: Home Disposition: [facility]  Recommendations for Outpatient Follow-up:  1. Follow up with PCP in 1-2 weeks 2. Follow-up with infectious disease, [name] on September 2 11 AM 3. Please obtain BMP/CBC in one week 4. Monitor weekly LFTs, ESR/CRP while on rifampin and vancomycin  Home Health: No Equipment/Devices: None  Discharge Condition: Stable CODE STATUS: Full code Diet recommendation: Renal diet, 1200 mL fluid restriction  History of present illness:  Doctor Dreshawn Hendershott. is an 69 y.o.male veteran withhistory of ESRD on hemodialysis on Monday Wednesday Friday, hypertension, diabetes mellitus type 2, anemia history of colon cancer status post resection has been having increasing headache back pain and found to have epidural abscess and is s/p laminectomy and MRSA septicemia.Required intubation due to AMS.    Hospital course:  Acute hypoxic respiratory failure with compromised airwaysecondary to acute encephalopathy Patient was briefly intubated requiring ventilatory support on 01/11/2020 and successfully extubated on 01/13/2020.  Oxygenating well on room air.  Acute metabolic toxic encephalopathy:short term memory seems to be impaired and I have concerns about his ability to make his own medical decisions currently CT Head and MRI brain negative for acute intracranial abnormalities; Suspect to be secondary to sepsisvs substance abuse and doubturemia currently.He had HD sessions per MWF schedule with improvement in mental status. Continue seroquel qHS for sundowning  MRSA bacteremia  Epidural abscess s/p laminectomy and fusion: Underwent I&D with laminectomy/fusion C4-T2 on 01/06/2020.  Continue vancomycin every Monday/Wednesday/Friday with HD and rifampin 300 mg  p.o. twice daily to complete 8-week course per infectious disease through March 03, 2020.  Will need weekly LFTs on rifampin and weekly ESR/CRP. Outpatient follow-up with infectious disease scheduled on September 2 at 11 AM with [name], NP.  ESRD on HD: Patient continues to have issues regarding his AV fistula requiring use of a temporary right IJ catheter placed by PCCM on 01/10/2020.  Underwent left upper extremity fistulogram with no significant stenosis or occlusion with superficial subcutaneous hematoma in the upper arm overlying portion of the access which could result in temporary difficult cannulation.  IR also placed a tunneled HD catheter on 01/21/2020. Continue HD per nephrology  Anemia of chronic disease: No signs of active bleeding.Suspect secondary to acute illness and anemia of chronic disease.  Received 1 unit PRBC on 01/16/2020. --Hgb 7.9>7.1>7.5; continue transfusions as needed with HD --aranesp per nephrology  Type 2 DM with hyperglycemia Hemoglobin A1c 6.5 on 01/12/2020, well controlled.  Continue Levemir 11u [place] daily.  Alcohol abuse/cocaine abuse encourage cessation   Essential hypertension Continue metoprolol tartrate 25 mg p.o. twice daily, Amlodipine 10 mg p.o. daily  Hyperlipidemia: Continue atorvastatin 20 g p.o. daily  Discharge Diagnoses:  Principal Problem:   MRSA bacteremia Active Problems:   TOBACCO ABUSE   OBSTRUCTIVE SLEEP APNEA   Colon cancer, status Post resection    HYPERTENSION   ESRD (end stage renal disease) ([place])   Epidural abscess   Acute encephalopathy   Bacteremia associated with intravascular line ([place])   Hemodialysis patient [facility])    Discharge Instructions  Discharge Instructions    Call MD for:  difficulty breathing, headache or visual disturbances   Complete by: As directed    Call MD for:  extreme fatigue   Complete by: As directed    Call MD for:  persistant dizziness or light-headedness  Complete by: As  directed    Call MD for:  persistant nausea and vomiting   Complete by: As directed    Call MD for:  severe uncontrolled pain   Complete by: As directed    Call MD for:  temperature >100.4   Complete by: As directed    Diet - low sodium heart healthy   Complete by: As directed    Increase activity slowly   Complete by: As directed    No wound care   Complete by: As directed      Allergies as of 01/24/2020   No Known Allergies     Medication List    STOP taking these medications   betamethasone dipropionate 0.05 % cream   carbamazepine 200 MG tablet Commonly known as: TEGretol     TAKE these medications   amLODipine 10 MG tablet Commonly known as: NORVASC Take 1 tablet (10 mg total) by mouth at bedtime.   atorvastatin 20 MG tablet Commonly known as: LIPITOR Take 1 tablet (20 mg total) by mouth at bedtime.   calcitRIOL 0.25 MCG capsule Commonly known as: ROCALTROL Take 5 capsules (1.25 mcg total) by mouth 3 (three) times a week.   clonazePAM 0.5 MG tablet Commonly known as: KLONOPIN Take 1 tablet (0.5 mg total) by mouth 2 (two) times daily as needed (Anxiety/agitation).   Darbepoetin Alfa 100 MCG/0.5ML Sosy injection Commonly known as: ARANESP Inject 0.5 mLs (100 mcg total) into the vein every Monday with hemodialysis.   HYDROcodone-acetaminophen 5-325 MG tablet Commonly known as: NORCO/VICODIN Take 1 tablet by mouth every 6 (six) hours. What changed:   when to take this  reasons to take this   insulin detemir 100 UNIT/ML injection Commonly known as: LEVEMIR Inject 0.11 mLs (11 Units total) into the skin 2 (two) times daily. What changed:   how much to take  when to take this   lidocaine-prilocaine cream Commonly known as: EMLA Apply 1 application topically once. Apply to needle site 1 hr. Prior to cannulation for hemodialysis.   metoprolol tartrate 25 MG tablet Commonly known as: LOPRESSOR Take 1 tablet (25 mg total) by mouth 2 (two) times  daily. What changed:   medication strength  how much to take   multivitamin Tabs tablet Take 1 tablet by mouth at bedtime. What changed: when to take this   polyethylene glycol 17 g packet Commonly known as: MIRALAX / GLYCOLAX Take 17 g by mouth daily as needed for moderate constipation or severe constipation.   QUEtiapine 25 MG tablet Commonly known as: SEROQUEL Take 0.5 tablets (12.5 mg total) by mouth at bedtime.   rifampin 300 MG capsule Commonly known as: RIFADIN Take 1 capsule (300 mg total) by mouth every 12 (twelve) hours.   sevelamer carbonate 800 MG tablet Commonly known as: RENVELA Take 2 tablets (1,600 mg total) by mouth 3 (three) times daily with meals.   Vancomycin 750-5 MG/150ML-% Soln Commonly known as: VANCOCIN Inject 150 mLs (750 mg total) into the vein every Monday, Wednesday, and Friday with hemodialysis.       Contact information for follow-up providers    [facility] Follow up on 02/03/2020.   Specialty: Infectious Diseases Why: 11:00 am appointment with [name], NP  Contact information: [address] [place], [place] [id number] [facility], [place]. Schedule an appointment as soon as possible for a visit in 1 week(s).   Contact  information: [place] [address] [phone]            Contact information for after-discharge care    Destination    [facility] Preferred SNF .   Service: Skilled Nursing Contact information: [address] [place] [phone]                 No Known Allergies  Consultations:  PCCM  Nephrology  Infectious disease   Procedures/Studies: DG Chest 2 View  Result Date: 01/04/2020 CLINICAL DATA:  Back pain, weakness and shortness of breath EXAM: CHEST - 2 VIEW COMPARISON:  Radiograph 12/30/2019 FINDINGS:  Tunneled dual lumen dialysis catheter tip terminates at the superior cavoatrial junction, similar position to prior. Cardiomediastinal contours are unremarkable. Telemetry leads overlie the chest. Some hazy interstitial opacities are present throughout the lungs with central vascular congestion and cephalization. No visible pneumothorax or effusion. No acute osseous or soft tissue abnormality. IMPRESSION: Hazy opacities likely reflect edema/volume overload given vascular congestion and redistribution though should correlate with clinical setting as atypical infection could present similarly. Electronically Signed   By: Lovena Le M.D.   On: 01/04/2020 15:02   DG Chest 2 View  Result Date: 12/30/2019 CLINICAL DATA:  Chest pain. EXAM: CHEST - 2 VIEW COMPARISON:  02/11/2019 FINDINGS: Right-sided dialysis catheter tip in the lower SVC. Upper normal heart size with improved cardiomegaly from prior exam. Minor basilar atelectasis. No confluent airspace disease. No pulmonary edema. No pleural effusion or pneumothorax. No acute osseous abnormalities are seen. IMPRESSION: Minor basilar atelectasis. Electronically Signed   By: Keith Rake M.D.   On: 12/30/2019 22:27   DG Cervical Spine 2-3 Views  Result Date: 01/06/2020 CLINICAL DATA:  Cervicothoracic laminectomy infusion EXAM: CERVICAL SPINE - 2-3 VIEW; DG C-ARM 1-60 MIN COMPARISON:  None. FLUOROSCOPY TIME:  Fluoroscopy Time:  10.8 seconds Radiation Exposure Index (if provided by the fluoroscopic device): 2.51 mGy Number of Acquired Spot Images: 2 FINDINGS: Posterior fusion at C4-T1 is noted. The inferior aspect is incompletely evaluated on the lateral film. IMPRESSION: Cervicothoracic fusion. Electronically Signed   By: Inez Catalina M.D.   On: 01/06/2020 16:46   CT HEAD WO CONTRAST  Result Date: 01/11/2020 CLINICAL DATA:  69 year old male with altered mental status. Cervical spine epidural abscess, postop day 5. Intubated. EXAM: CT HEAD WITHOUT CONTRAST  TECHNIQUE: Contiguous axial images were obtained from the base of the skull through the vertex without intravenous contrast. COMPARISON:  Brain MRI 12/31/2019.  Cervical spine MRI 01/04/2020. FINDINGS: Brain: Abnormal appearance of the visible upper cervical spinal canal on series 3, image 1, with epidural fluid or soft tissue, probably not significantly changed from the abnormal cervical MRI 01/04/2020 (please see that report) No ventriculomegaly. No intracranial mass effect. Basilar cisterns remain patent. 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 Elsewhere cerebral gray-white matter differentiation remains within normal limits. No cortically based acute infarct identified. No acute intracranial hemorrhage identified. Vascular: No suspicious intracranial vascular hyperdensity. Skull: The skull appears stable and intact. Sinuses/Orbits: Minimal fluid in a posterior right ethmoid air cell. Other Visualized paranasal sinuses and mastoids are stable and well pneumatized. Other: Partially visible midline suboccipital postoperative changes to the scalp (series 4, image 1). No other acute orbit or scalp soft tissue finding. IMPRESSION: 1. No acute intracranial abnormality. Stable thalamic small vessel disease. 2. Continued abnormal upper cervical epidural space. Partially visible cervical posterior midline postoperative changes. Electronically Signed   By: Lemmie Evens  [place] Crane M.D.   On: 01/11/2020 01:21   CT Head Wo Contrast  Result Date: 12/31/2019 CLINICAL DATA:  Headache. EXAM: CT HEAD WITHOUT CONTRAST TECHNIQUE: Contiguous axial images were obtained from the base of the skull through the vertex without intravenous contrast. COMPARISON:  None. FINDINGS: Brain: Mild chronic ischemic white matter disease is noted. No mass effect or midline shift is noted. Ventricular size is within normal limits. There is no evidence of mass lesion, hemorrhage or acute infarction. Vascular: No hyperdense vessel or  unexpected calcification. Skull: Normal. Negative for fracture or focal lesion. Sinuses/Orbits: No acute finding. Other: None. IMPRESSION: Mild chronic ischemic white matter disease. No acute intracranial abnormality seen. Electronically Signed   By: Marijo Conception M.D.   On: 12/31/2019 09:19   MR BRAIN WO CONTRAST  Result Date: 01/11/2020 CLINICAL DATA:  Mental status change EXAM: MRI HEAD WITHOUT CONTRAST TECHNIQUE: Multiplanar, multiecho pulse sequences of the brain and surrounding structures were obtained without intravenous contrast. COMPARISON:  12/31/2019 FINDINGS: Brain: There is no acute infarction or intracranial hemorrhage. There is no intracranial mass, mass effect, or edema. There is no hydrocephalus or extra-axial fluid collection. Prominence of the ventricles and sulci reflects mild generalized parenchymal volume loss. Patchy and confluent areas of T2 hyperintensity in the supratentorial white matter are nonspecific but may reflect moderate chronic microvascular ischemic changes. There are chronic small vessel infarcts of the thalamus and right pons. Several small foci of susceptibility hypointensity are present likely reflecting chronic microhemorrhages also present on the prior study. Vascular: Major vessel flow voids at the skull base are preserved. Skull and upper cervical spine: Normal marrow signal is preserved. Sinuses/Orbits: Paranasal sinuses are aerated. Orbits are unremarkable. Other: Sella is unremarkable.  Mastoid air cells are clear. IMPRESSION: No evidence of recent infarction, hemorrhage, mass. Stable chronic findings detailed above. Electronically Signed   By: Macy Mis M.D.   On: 01/11/2020 11:56   MR BRAIN WO CONTRAST  Result Date: 12/31/2019 CLINICAL DATA:  Headache.  Difficulty walking.  Regional pain. EXAM: MRI HEAD WITHOUT CONTRAST TECHNIQUE: Multiplanar, multiecho pulse sequences of the brain and surrounding structures were obtained without intravenous contrast.  COMPARISON:  Head CT same day FINDINGS: Brain: Diffusion imaging does not show any acute or subacute infarction. Old small vessel infarction in the right para median pons. No focal cerebellar finding. Cerebral hemispheres show an old lacunar infarction in the medial thalamus bilaterally. There are moderate chronic small-vessel ischemic changes of the cerebral hemispheric white matter. No cortical or large vessel territory infarction. No mass lesion, hemorrhage, hydrocephalus or extra-axial collection. No lesion seen of the perimesencephalic cisterns. Vascular: Major vessels at the base of the brain show flow. Skull and upper cervical spine: Negative Sinuses/Orbits: Clear/normal Other: None IMPRESSION: No acute, subacute or reversible finding. 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 Electronically Signed   By: Nelson Chimes M.D.   On: 12/31/2019 12:27   MR CERVICAL SPINE WO CONTRAST  Result Date: 01/04/2020 CLINICAL DATA:  Severe diffuse back pain. Study was ordered without and contrast. None contrast imaging the cervical spine was completed. Patient refused further imaging due to pain. Study is moderately degraded by patient motion. EXAM: MRI CERVICAL SPINE WITHOUT CONTRAST TECHNIQUE: Multiplanar, multisequence MR imaging of the cervical spine was performed. No intravenous contrast was administered. COMPARISON:  MR head without contrast 12/31/2019. FINDINGS: Alignment: Slight retrolisthesis is present at C5-6. Degenerative anterolisthesis is present T1-2. Reversal of the normal cervical lordosis is noted.  Vertebrae: Decreased T1 marrow signal is present in the lower cervical spine, particularly C7 and T1. Vertebral body heights are maintained. Cord: T2 signal hyperintensity is present in the spinal cord from C3-4 through C5. Posterior Fossa, vertebral arteries, paraspinal tissues: Visualized brainstem and cerebellum are normal. Diffuse edema is present in the  occipital paraspinous musculature bilaterally. No discrete abscess is present. Extensive prevertebral edema extends from the clivus to C6. No abscess is present. Mild edema is present in the posterior paraspinous muscles, left greater than right. Disc levels: a abnormal fluid present in the dorsal spinal canal extending from foramen magnum to the T2 level. Severe disc disease present at C5-6 and C6-7. The cord is compressed at these levels. Mild disc disease present at C3-4 and C4-5 with some cord compression at these levels. IMPRESSION: 1. Abnormal fluid in the dorsal spinal canal extending from foramen magnum to the T2 level. Given the other inflammatory changes and concern, this most likely represents epidural infection. There is some fluid ventral to the spinal cord at C2. 2. Diffuse prevertebral edema from the clivus to C6 is also concerning for infection. 3. The cord is compressed without abnormal signal suggesting edema. 4. Mild edema in the posterior paraspinous muscles, left greater than right. Edema is most significant within the paraspinous muscles just below the occiput. No focal abscess is present. 5. Severe disc disease at C5-6 and C6-7 with moderate central canal stenosis at both levels. 6. Mild disc disease at C3-4 and C4-5 with some cord compression at these levels. 7. Abnormal marrow signal in the lower cervical spine, particularly C7 and T1. This is nonspecific and can be seen in the setting of anemia, smoking, or obesity. Electronically Signed   By: San Morelle M.D.   On: 01/04/2020 18:51   MR THORACIC SPINE WO CONTRAST  Result Date: 01/05/2020 CLINICAL DATA:  Epidural fluid collection.  Back pain and neck pain. EXAM: MRI THORACIC SPINE WITHOUT CONTRAST TECHNIQUE: Multiplanar, multisequence MR imaging of the thoracic spine was performed. No intravenous contrast was administered. COMPARISON:  None. FINDINGS: Alignment:  Physiologic. Vertebrae: No fracture, evidence of discitis, or bone  lesion. Cord: Dorsal epidural fluid collection extending from the cervical spine to T2 level is again seen. There is no continuation of the collection below the T2 level. Spinal cord signal is normal. Paraspinal and other soft tissues: Negative. Disc levels: At T2-3, there is a small right subarticular disc protrusion. There is no other thoracic disc herniation. No spinal canal or neural foraminal stenosis. IMPRESSION: 1. Dorsal epidural fluid collection extending from the cervical spine to T2 level, consistent with epidural abscess. No continuation below the T2 level. 2. No thoracic spinal canal or neural foraminal stenosis. Electronically Signed   By: Ulyses Jarred M.D.   On: 01/05/2020 19:42   MR LUMBAR SPINE WO CONTRAST  Result Date: 01/06/2020 CLINICAL DATA:  In stage renal disease patient on hemodialysis. Worsening headache and back pain. Assess for evidence of infection. Cervicothoracic dorsal epidural fluid collection. EXAM: MRI LUMBAR SPINE WITHOUT CONTRAST TECHNIQUE: Multiplanar, multisequence MR imaging of the lumbar spine was performed. No intravenous contrast was administered. COMPARISON:  Cervical study done 2 days ago. Thoracic study done yesterday. FINDINGS: Segmentation:  5 lumbar type vertebral bodies assumed. Alignment:  2 mm degenerative anterolisthesis L2-3, L3-4 and L4-5. Vertebrae: No fracture or primary bone lesion. No finding to suggest discitis or osteomyelitis. See below regarding facet arthropathy. Conus medullaris and cauda equina: Conus extends to the T12-L1 level. There is what  appears to be a subdural fluid collection at the posterior left quadrant extending from T12-L1 to L2-3, with maximal size on the axial images 7 x 11 mm in cross-sectional diameter. This causes mass effect upon the neural structures, crowding them anteriorly. Additionally, there is probably some epidural material posteriorly at L3 to L4. There may be some subdural component of this as well. Paraspinal and  other soft tissues: Nonspecific edematous change of the posterior para spinous musculature, right worse than left. See below. Disc levels: L1-2: No significant disc pathology. No canal stenosis. See above regarding subdural collection. L2-3: Facet arthropathy with 2 mm of anterolisthesis. Bulging of the disc. Mild multifactorial spinal stenosis. Some facet edema on both sides. This does not appear pronounced, but facet joint infection is not excluded. L3-4: Facet arthropathy right more than left with 2 mm of anterolisthesis. Bulging of the disc. Moderate to severe multifactorial spinal stenosis that could cause neural compression. Facet arthropathy may simply be degenerative, but facet infection is not excluded, particularly on the right. L4-5: Facet arthropathy with 2 mm of anterolisthesis. Bulging of the disc. Severe multifactorial spinal stenosis that could cause neural compression. Facet edema right more than left that could be degenerative or potentially relate to infection. L5-S1: Mild bulging of the disc. Mild facet osteoarthritis. No apparent compressive stenosis. IMPRESSION: 1. There is what appears to be a subdural fluid collection at the posterior left quadrant extending from T12-L1 to L2-3, with maximal diameter on the axial images 7 x 11 mm in cross-sectional diameter. This causes mass effect upon the neural structures, crowding them anteriorly. Additionally, there is probably some epidural material posteriorly at L3 to L4. This could be a small subdural component of this as well. This could be infected material. 2. 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 There is 2 mm of anterolisthesis at L2-3, L3-4 and L4-5. There is disc degeneration with bulging of the discs. There is moderate to severe multifactorial spinal stenosis at L3-4 and severe spinal stenosis at L4-5 that could cause neural compression. Facet joint edema at L2-3, L3-4 and L4-5 could be  degenerative or potentially relate to infection. The single most suspicious looking facet joint is on the right at L3-4. Additionally, the posterior para spinous soft tissue structures show edematous change which could be inflammatory. Electronically Signed   By: Nelson Chimes M.D.   On: 01/06/2020 11:25   US AORTA  Result Date: 01/09/2020 CLINICAL DATA:  Pulsatile abdominal mass. Evaluate for possible abdominal aortic aneurysm. EXAM: ULTRASOUND OF ABDOMINAL AORTA TECHNIQUE: Ultrasound examination of the abdominal aorta and proximal common iliac arteries was performed to evaluate for aneurysm. Additional color and Doppler images of the distal aorta were obtained to document patency. COMPARISON:  CT abdomen and pelvis 03/29/2012 FINDINGS: Abdominal aortic measurements as follows: Proximal:  2.6 x 2.6 cm Mid:  2.2 x 2.6 cm Distal:  1.9 x 1.8 cm Patent: Yes, peak systolic velocity is 735.3 cm/s Right common iliac artery: 1.3 x 1.5 cm Left common iliac artery: 1.2 x 1.2 cm Study quality is degraded by patient motion and overlying bowel gas. IMPRESSION: 1. No evidence for abdominal aortic aneurysm. 2. Ectatic abdominal aorta at risk for aneurysm development. Recommend followup by ultrasound in 5 years. This recommendation follows ACR consensus guidelines: White Paper of the ACR Incidental Findings Committee II on Vascular Findings. J Am Coll. Electronically Signed   By: Nolon Nations M.D.   On: 01/09/2020 09:15   IR Fluoro Guide  CV Line Right  Result Date: 01/21/2020 INDICATION: 69 year old male with end-stage renal disease on hemodialysis via a left upper extremity brachiocephalic arteriovenous fistula. He recently had an extravasation event with hematoma formation making it difficult to access the left upper extremity fistula. He currently has a temporary IJ hemodialysis catheter on the right but needs a more durable access in till his fistula can again be used. EXAM: TUNNELED CENTRAL VENOUS HEMODIALYSIS  CATHETER PLACEMENT WITH ULTRASOUND AND FLUOROSCOPIC GUIDANCE MEDICATIONS: 2 g Ancef. The antibiotic was given in an appropriate time interval prior to skin puncture. ANESTHESIA/SEDATION: None. FLUOROSCOPY TIME:  Fluoroscopy Time: 1 minutes 18 seconds (13 mGy). COMPLICATIONS: None immediate. PROCEDURE: Informed written consent was obtained from the patient after a discussion of the risks, benefits, and alternatives to treatment. Questions regarding the procedure were encouraged and answered. The right neck and chest were prepped with chlorhexidine in a sterile fashion, and a sterile drape was applied covering the operative field. Maximum barrier sterile technique with sterile gowns and gloves were used for the procedure. A timeout was performed prior to the initiation of the procedure. After creating a small venotomy incision, a micropuncture kit was utilized to access the right internal jugular vein under direct, real-time ultrasound guidance after the overlying soft tissues were anesthetized with 1% lidocaine with epinephrine. Ultrasound image documentation was performed. The microwire was kinked to measure appropriate catheter length. A stiff Glidewire was advanced to the level of the IVC and the micropuncture sheath was exchanged for a peel-away sheath. A palindrome tunneled hemodialysis catheter measuring 23 cm from tip to cuff was tunneled in a retrograde fashion from the anterior chest wall to the venotomy incision. The catheter was then placed through the peel-away sheath with tips ultimately positioned within the superior aspect of the right atrium. Final catheter positioning was confirmed and documented with a spot radiographic image. The catheter aspirates and flushes normally. The catheter was flushed with appropriate volume heparin dwells. The catheter exit site was secured with a 0-Prolene retention suture. The venotomy incision was closed with Dermabond. Dressings were applied. The patient tolerated  the procedure well without immediate post procedural complication. IMPRESSION: Successful placement of 23 cm tip to cuff tunneled hemodialysis catheter via the right internal jugular vein with tips terminating within the superior aspect of the right atrium. The catheter is ready for immediate use. The previously placed temporary right IJ catheter was removed. Electronically Signed   By: Jacqulynn Cadet M.D.   On: 01/21/2020 11:58   IR Removal Tun Cv Cath W/O FL  Result Date: 01/10/2020 INDICATION: Functioning fistula now. EXAM: REMOVAL TUNNELED CENTRAL VENOUS CATHETER MEDICATIONS: 1% lidocaine 10 mL COMPLICATIONS: None immediate PROCEDURE: Informed written consent was obtained from the patient after a thorough discussion of the procedural risks, benefits and alternatives. All questions were addressed. Maximal Sterile Barrier Technique was utilized including caps, mask, sterile gowns, sterile gloves, sterile drape, hand hygiene and skin antiseptic. A timeout was performed prior to the initiation of the procedure. The patient's right chest and catheter was prepped and draped in a normal sterile fashion. Heparin was removed from both ports of catheter. 1% lidocaine was used for local anesthesia. Using gentle blunt dissection the cuff of the catheter was exposed and the catheter was removed in it's entirety. Pressure was held till hemostasis was obtained. A sterile dressing was applied. The patient tolerated the procedure well with no immediate complications. IMPRESSION: Successful catheter removal as described above. Read by: Soyla Dryer, NP Electronically Signed  By: Lucrezia Europe M.D.   On: 01/07/2020 17:03   IR US Guide Vasc Access Left  Result Date: 01/21/2020 INDICATION: 69 year old male with end-stage renal disease on hemodialysis via a left upper extremity brachiocephalic arteriovenous fistula. He recently had an extravasation event with hematoma formation making it difficult to access the left upper  extremity fistula. He currently has a temporary IJ hemodialysis catheter on the right but needs a more durable access in till his fistula can again be used. EXAM: TUNNELED CENTRAL VENOUS HEMODIALYSIS CATHETER PLACEMENT WITH ULTRASOUND AND FLUOROSCOPIC GUIDANCE MEDICATIONS: 2 g Ancef. The antibiotic was given in an appropriate time interval prior to skin puncture. ANESTHESIA/SEDATION: None. FLUOROSCOPY TIME:  Fluoroscopy Time: 1 minutes 18 seconds (13 mGy). COMPLICATIONS: None immediate. PROCEDURE: Informed written consent was obtained from the patient after a discussion of the risks, benefits, and alternatives to treatment. Questions regarding the procedure were encouraged and answered. The right neck and chest were prepped with chlorhexidine in a sterile fashion, and a sterile drape was applied covering the operative field. Maximum barrier sterile technique with sterile gowns and gloves were used for the procedure. A timeout was performed prior to the initiation of the procedure. After creating a small venotomy incision, a micropuncture kit was utilized to access the right internal jugular vein under direct, real-time ultrasound guidance after the overlying soft tissues were anesthetized with 1% lidocaine with epinephrine. Ultrasound image documentation was performed. The microwire was kinked to measure appropriate catheter length. A stiff Glidewire was advanced to the level of the IVC and the micropuncture sheath was exchanged for a peel-away sheath. A palindrome tunneled hemodialysis catheter measuring 23 cm from tip to cuff was tunneled in a retrograde fashion from the anterior chest wall to the venotomy incision. The catheter was then placed through the peel-away sheath with tips ultimately positioned within the superior aspect of the right atrium. Final catheter positioning was confirmed and documented with a spot radiographic image. The catheter aspirates and flushes normally. The catheter was flushed with  appropriate volume heparin dwells. The catheter exit site was secured with a 0-Prolene retention suture. The venotomy incision was closed with Dermabond. Dressings were applied. The patient tolerated the procedure well without immediate post procedural complication. IMPRESSION: Successful placement of 23 cm tip to cuff tunneled hemodialysis catheter via the right internal jugular vein with tips terminating within the superior aspect of the right atrium. The catheter is ready for immediate use. The previously placed temporary right IJ catheter was removed. Electronically Signed   By: Jacqulynn Cadet M.D.   On: 01/21/2020 11:58   IR US Guide Vasc Access Right  Result Date: 01/21/2020 INDICATION: 69 year old male with end-stage renal disease. He was dialyzing via a left upper extremity arteriovenous fistula but suffered in extravasation event resulting in hematoma formation in the arm and is now dialyzing via a temporary IJ catheter on the right. EXAM: Fistulagram MEDICATIONS: None. ANESTHESIA/SEDATION: None FLUOROSCOPY TIME:  Fluoroscopy Time: 1 minutes 18 seconds (13 mGy). COMPLICATIONS: None immediate. PROCEDURE: Informed written consent was obtained from the patient after a thorough discussion of the procedural risks, benefits and alternatives. All questions were addressed. Maximal Sterile Barrier Technique was utilized including caps, mask, sterile gowns, sterile gloves, sterile drape, hand hygiene and skin antiseptic. A timeout was performed prior to the initiation of the procedure. The left upper extremity AV fistula was interrogated with ultrasound and found to be widely patent. An image was obtained and stored for the medical record. Local anesthesia was attained  by infiltration with 1% lidocaine. A small dermatotomy was made. Under real-time sonographic guidance, the vessel was punctured with a 21 gauge micropuncture needle. Using standard technique, the initial micro needle was exchanged over a 0.018  micro wire for a transitional 4 [place] micro sheath. A diagnostic fistulagram was then performed. This is a left upper extremity brachiocephalic arteriovenous fistula. There are multiple side branches with some cross-filling between the cephalic vein and the deeper brachial basilic system. Mild tortuosity present at the cephalic arch. However, there is not appear to be a hemodynamically significant stenosis or evidence of clot. The central veins are widely patent. The arterial anastomosis also appears widely patent. Normal palpable thrill. Sonographic evaluation of the upper extremity does demonstrate a moderately large subcutaneous hematoma overlying a portion of the access zone. IMPRESSION: 1. Left upper extremity brachiocephalic arteriovenous fistula without evidence of significant stenosis or occlusion. 2. There are multiple small side branches but these do not appear to limit flow or function. 3. Superficial subcutaneous hematoma in the upper arm overlying a portion of the access zone may result in temporarily difficult cannulation. ACCESS: This access remains amenable to future percutaneous interventions as clinically indicated. Electronically Signed   By: Jacqulynn Cadet M.D.   On: 01/21/2020 13:52   DG Chest Port 1 View  Result Date: 01/12/2020 CLINICAL DATA:  Respiratory failure. EXAM: PORTABLE CHEST 1 VIEW COMPARISON:  The chest x-ray 01/11/2020 FINDINGS: The heart size is normal. Endotracheal tube terminates 5.5 cm above the carina. Enteric tube courses off the inferior border the film. Right IJ line is stable. Left lower lobe airspace opacity is stable. No new airspace disease is present. IMPRESSION: 1. Stable left lower lobe airspace disease. 2. Support apparatus is stable. Electronically Signed   By: San Morelle M.D.   On: 01/12/2020 05:49   DG CHEST PORT 1 VIEW  Result Date: 01/11/2020 CLINICAL DATA:  Respiratory distress. EXAM: PORTABLE CHEST 1 VIEW COMPARISON:  Earlier film, same  date. FINDINGS: The endotracheal tube, NG tube and right IJ catheters are stable. The cardiac silhouette, mediastinal and hilar contours are within normal limits and stable. The lungs are clear. IMPRESSION: 1. Stable support apparatus. 2. No acute cardiopulmonary findings. Electronically Signed   By: Marijo Sanes M.D.   On: 01/11/2020 05:45   DG CHEST PORT 1 VIEW  Result Date: 01/11/2020 CLINICAL DATA:  69 year old male intubated, enteric tube placement. Recent posterior C4-T1 fusion. EXAM: PORTABLE CHEST 1 VIEW COMPARISON:  Portable chest 01/10/2020 and earlier. FINDINGS: Portable AP semi upright view at 0024 hours. Stable right IJ central line. Endotracheal tube tip projects about 8 mm above the carina. Enteric tube courses to the abdomen and into the stomach. Improved lung volumes. Mediastinal contours remain within normal limits. Allowing for portable technique the lungs are clear. Negative visible bowel gas pattern. Posterior cervicothoracic spinal fusion hardware with skin staples. IMPRESSION: 1. Endotracheal tube tip approximately 8 mm above the carina. Enteric tube courses to the stomach. 2.  No acute cardiopulmonary abnormality. Electronically Signed   By: Genevie Ann M.D.   On: 01/11/2020 00:56   DG Chest Port 1 View  Result Date: 01/10/2020 CLINICAL DATA:  69 year old male status post central line placement. EXAM: PORTABLE CHEST 1 VIEW COMPARISON:  Radiograph dated 01/04/2020 FINDINGS: Right IJ central venous line with tip over upper SVC. No pneumothorax. There is no focal consolidation, or pleural effusion. Top-normal cardiac size. No acute osseous pathology. Partially visualized lower cervical fusion. IMPRESSION: Right IJ central venous line with  tip over upper SVC. No pneumothorax. Electronically Signed   By: Anner Crete M.D.   On: 01/10/2020 18:45   DG Abd Portable 1V  Result Date: 01/11/2020 CLINICAL DATA:  69 year old male intubated, enteric tube placement. EXAM: PORTABLE ABDOMEN - 1  VIEW COMPARISON:  Abdominal radiograph 09/05/2012. FINDINGS: Portable AP supine view at 0032 hours. Enteric tube has been placed into the stomach with side hole at the level of the gastric antrum. Tube tip may be within the proximal duodenum. Non obstructed bowel gas pattern. No acute osseous abnormality identified. IMPRESSION: Enteric tube placed into the stomach, tube tip may be within the proximal duodenum. Electronically Signed   By: Genevie Ann M.D.   On: 01/11/2020 00:54   DG C-Arm 1-60 Min  Result Date: 01/06/2020 CLINICAL DATA:  Cervicothoracic laminectomy infusion EXAM: CERVICAL SPINE - 2-3 VIEW; DG C-ARM 1-60 MIN COMPARISON:  None. FLUOROSCOPY TIME:  Fluoroscopy Time:  10.8 seconds Radiation Exposure Index (if provided by the fluoroscopic device): 2.51 mGy Number of Acquired Spot Images: 2 FINDINGS: Posterior fusion at C4-T1 is noted. The inferior aspect is incompletely evaluated on the lateral film. IMPRESSION: Cervicothoracic fusion. Electronically Signed   By: Inez Catalina M.D.   On: 01/06/2020 16:46   IR DIALY SHUNT INTRO NEEDLE/INTRACATH INITIAL W/IMG LEFT  Result Date: 01/21/2020 INDICATION: 69 year old male with end-stage renal disease. He was dialyzing via a left upper extremity arteriovenous fistula but suffered in extravasation event resulting in hematoma formation in the arm and is now dialyzing via a temporary IJ catheter on the right. EXAM: Fistulagram MEDICATIONS: None. ANESTHESIA/SEDATION: None FLUOROSCOPY TIME:  Fluoroscopy Time: 1 minutes 18 seconds (13 mGy). COMPLICATIONS: None immediate. PROCEDURE: Informed written consent was obtained from the patient after a thorough discussion of the procedural risks, benefits and alternatives. All questions were addressed. Maximal Sterile Barrier Technique was utilized including caps, mask, sterile gowns, sterile gloves, sterile drape, hand hygiene and skin antiseptic. A timeout was performed prior to the initiation of the procedure. The left  upper extremity AV fistula was interrogated with ultrasound and found to be widely patent. An image was obtained and stored for the medical record. Local anesthesia was attained by infiltration with 1% lidocaine. A small dermatotomy was made. Under real-time sonographic guidance, the vessel was punctured with a 21 gauge micropuncture needle. Using standard technique, the initial micro needle was exchanged over a 0.018 micro wire for a transitional 4 [place] micro sheath. A diagnostic fistulagram was then performed. This is a left upper extremity brachiocephalic arteriovenous fistula. There are multiple side branches with some cross-filling between the cephalic vein and the deeper brachial basilic system. Mild tortuosity present at the cephalic arch. However, there is not appear to be a hemodynamically significant stenosis or evidence of clot. The central veins are widely patent. The arterial anastomosis also appears widely patent. Normal palpable thrill. Sonographic evaluation of the upper extremity does demonstrate a moderately large subcutaneous hematoma overlying a portion of the access zone. IMPRESSION: 1. Left upper extremity brachiocephalic arteriovenous fistula without evidence of significant stenosis or occlusion. 2. There are multiple small side branches but these do not appear to limit flow or function. 3. Superficial subcutaneous hematoma in the upper arm overlying a portion of the access zone may result in temporarily difficult cannulation. ACCESS: This access remains amenable to future percutaneous interventions as clinically indicated. Electronically Signed   By: Jacqulynn Cadet M.D.   On: 01/21/2020 13:52   ECHOCARDIOGRAM COMPLETE  Result Date: 01/07/2020    ECHOCARDIOGRAM  REPORT   Patient Name:   [name]. Date of Exam: 01/07/2020 Medical Rec #:  [id number]          Height:       71.5 in Accession #:    [id number]         Weight:       211.4 lb Date of Birth:  12-Jun-1950         BSA:           2.170 m Patient Age:    80 years           BP:           162/70 mmHg Patient Gender: M                  HR:           67 bpm. Exam Location:  Inpatient Procedure: 2D Echo, Cardiac Doppler and Color Doppler Indications:    Bacteremia.  History:        Patient has no prior history of Echocardiogram examinations.                 Signs/Symptoms:Murmur; Risk Factors:Current Smoker and Diabetes.                 MRSA. Cancer. ESRD.  Sonographer:    Roseanna Rainbow RDCS Referring Phys: [id number] [facility] AMIN  Sonographer Comments: Technically difficult study due to poor echo windows. Patient appears confused, had trouble stayiong still during exam. Cervical brace prevented optimal positioning and windows. IMPRESSIONS  1. Left ventricular ejection fraction, by estimation, is >75%. The left ventricle has hyperdynamic function. The left ventricle has no regional wall motion abnormalities. There is mild left ventricular hypertrophy. Left ventricular diastolic parameters are consistent with Grade II diastolic dysfunction (pseudonormalization). Elevated left atrial pressure.  2. Right ventricular systolic function is normal. The right ventricular size is normal.  3. The mitral valve is normal in structure. Trivial mitral valve regurgitation. No evidence of mitral stenosis.  4. The aortic valve is tricuspid. Aortic valve regurgitation is not visualized. Mild aortic valve sclerosis is present, with no evidence of aortic valve stenosis.  5. The inferior vena cava is normal in size with greater than 50% respiratory variability, suggesting right atrial pressure of 3 mmHg. FINDINGS  Left Ventricle: Left ventricular ejection fraction, by estimation, is >75%. The left ventricle has hyperdynamic function. The left ventricle has no regional wall motion abnormalities. The left ventricular internal cavity size was normal in size. There is mild left ventricular hypertrophy. Left ventricular diastolic parameters are consistent with Grade II  diastolic dysfunction (pseudonormalization). Elevated left atrial pressure. Right Ventricle: The right ventricular size is normal.Right ventricular systolic function is normal. Left Atrium: Left atrial size was normal in size. Right Atrium: Right atrial size was normal in size. Pericardium: There is no evidence of pericardial effusion. Mitral Valve: The mitral valve is normal in structure. Normal mobility of the mitral valve leaflets. Trivial mitral valve regurgitation. No evidence of mitral valve stenosis. Tricuspid Valve: The tricuspid valve is normal in structure. Tricuspid valve regurgitation is trivial. No evidence of tricuspid stenosis. Aortic Valve: The aortic valve is tricuspid. Aortic valve regurgitation is not visualized. Mild aortic valve sclerosis is present, with no evidence of aortic valve stenosis. Pulmonic Valve: The pulmonic valve was not well visualized. Pulmonic valve regurgitation is not visualized. No evidence of pulmonic stenosis. Aorta: The aortic root is normal in size and structure. Venous: The inferior vena cava is  normal in size with greater than 50% respiratory variability, suggesting right atrial pressure of 3 mmHg. IAS/Shunts: No atrial level shunt detected by color flow Doppler.  LEFT VENTRICLE PLAX 2D LVIDd:         5.10 cm     Diastology LVIDs:         3.10 cm     LV e' lateral:   10.10 cm/s LV PW:         1.40 cm     LV E/e' lateral: 11.3 LV IVS:        1.00 cm     LV e' medial:    6.64 cm/s LVOT diam:     2.10 cm     LV E/e' medial:  17.2 LV SV:         105 LV SV Index:   48 LVOT Area:     3.46 cm  LV Volumes (MOD) LV vol d, MOD A2C: 71.2 ml LV vol d, MOD A4C: 90.8 ml LV vol s, MOD A2C: 23.8 ml LV vol s, MOD A4C: 22.6 ml LV SV MOD A2C:     47.4 ml LV SV MOD A4C:     90.8 ml LV SV MOD BP:      61.5 ml RIGHT VENTRICLE             IVC RV S prime:     17.00 cm/s  IVC diam: 1.50 cm TAPSE (M-mode): 3.1 cm LEFT ATRIUM           Index       RIGHT ATRIUM           Index LA diam:      3.00  cm 1.38 cm/m  RA Area:     12.30 cm LA Vol (A2C): 21.2 ml 9.77 ml/m  RA Volume:   23.40 ml  10.78 ml/m LA Vol (A4C): 47.8 ml 22.03 ml/m  AORTIC VALVE LVOT Vmax:   151.00 cm/s LVOT Vmean:  88.600 cm/s LVOT VTI:    0.302 m  AORTA Ao Root diam: 3.30 cm MITRAL VALVE MV Area (PHT): 3.06 cm     SHUNTS MV Decel Time: 248 msec     Systemic VTI:  0.30 m MV E velocity: 114.00 cm/s  Systemic Diam: 2.10 cm MV A velocity: 111.00 cm/s MV E/A ratio:  1.03 Kirk Ruths MD Electronically signed by Kirk Ruths MD Signature Date/Time: 01/07/2020/3:10:33 PM    Final       Subjective: Patient seen and examined in HD unit undergoing dialysis this morning, no complaints.  Plan to discharge to SNF following dialysis today.  We will continue antibiotics with vancomycin/rifampin per ID to complete 8-week course.  Patient denies headache, no visual changes, no chest pain, palpitations, no shortness of breath, no abdominal pain, no weakness.  No acute events overnight per nurse staff.  Discharge Exam: Vitals:   01/24/20 0900 01/24/20 0930  BP: 122/68 (!) 137/56  Pulse: 97 96  Resp:    Temp:    SpO2:     Vitals:   01/24/20 0800 01/24/20 0830 01/24/20 0900 01/24/20 0930  BP: (!) 132/92 114/84 122/68 (!) 137/56  Pulse: 87 (!) 105 97 96  Resp:      Temp:      TempSrc:      SpO2:      Weight:      Height:        General: Pt is alert, awake, not in acute distress Cardiovascular: RRR, S1/S2 +, no  rubs, no gallops, right IJ tunnel catheter noted Respiratory: CTA bilaterally, no wheezing, no rhonchi Abdominal: Soft, NT, ND, bowel sounds + Extremities: no edema, no cyanosis    The results of significant diagnostics from this hospitalization (including imaging, microbiology, ancillary and laboratory) are listed below for reference.     Microbiology: Recent Results (from the past 240 hour(s))  SARS CORONAVIRUS 2 (TAT 6-24 HRS) Nasopharyngeal Nasopharyngeal Swab     Status: None   Collection Time:  01/23/20  3:40 AM   Specimen: Nasopharyngeal Swab  Result Value Ref Range Status   SARS Coronavirus 2 NEGATIVE NEGATIVE Final    Comment: (NOTE) SARS-CoV-2 target nucleic acids are NOT DETECTED.  The SARS-CoV-2 RNA is generally detectable in upper and lower respiratory specimens during the acute phase of infection. Negative results do not preclude SARS-CoV-2 infection, do not rule out co-infections with other pathogens, and should not be used as the sole basis for treatment or other patient management decisions. Negative results must be combined with clinical observations, patient history, and epidemiological information. The expected result is Negative.  Fact Sheet for Patients: SugarRoll.be  Fact Sheet for Healthcare Providers: https://www.woods-mathews.com/  This test is not yet approved or cleared by the [place] FDA and  has been authorized for detection and/or diagnosis of SARS-CoV-2 by FDA under an Emergency Use Authorization (EUA). This EUA will remain  in effect (meaning this test can be used) for the duration of the COVID-19 declaration under Se ction 564(b)(1) of the Act, 21 U.S.C. section 360bbb-3(b)(1), unless the authorization is terminated or revoked sooner.  Performed at [facility], [place] [address] [place], [place] [address]      Labs: BNP (last 3 results) No results for input(s): BNP in the last 8760 hours. Basic Metabolic Panel: Recent Labs  Lab 01/20/20 0435 01/21/20 0401 01/22/20 0345 01/23/20 0750 01/24/20 0500  NA 133* 135 134* 134* 134*  K 3.6 4.0 4.3 3.6 3.8  CL 96* 98 98 97* 96*  CO2 26 25 20* 24 22  GLUCOSE 131* 140* 106* 108* 109*  BUN 23 35* 45* 29* 41*  CREATININE 6.83* 9.27* 11.41* 8.52* 10.27*  CALCIUM 7.4* 7.5* 7.5* 7.7* 7.8*  PHOS 4.5 6.0* 6.7* 5.2* 6.0*   Liver Function Tests: Recent Labs  Lab 01/20/20 0435 01/21/20 0401 01/22/20 0345 01/23/20 0750 01/24/20 0500  AST  27 35  --   --   --   ALT 17 21  --   --   --   ALKPHOS 95 102  --   --   --   BILITOT 1.2 0.8  --   --   --   PROT 7.0 6.9  --   --   --   ALBUMIN 1.9* 1.8* 1.9* 1.8* 1.9*   No results for input(s): LIPASE, AMYLASE in the last 168 hours. No results for input(s): AMMONIA in the last 168 hours. CBC: Recent Labs  Lab 01/19/20 0332 01/21/20 0401 01/22/20 0345 01/24/20 0612  WBC 8.9 11.0* 9.5 7.7  HGB 7.9* 7.1* 7.5* 7.2*  HCT 23.8* 21.8* 22.9* 22.7*  MCV 82.6 83.5 83.0 82.5  PLT 204 250 234 231   Cardiac Enzymes: No results for input(s): CKTOTAL, CKMB, CKMBINDEX, TROPONINI in the last 168 hours. BNP: Invalid input(s): POCBNP CBG: Recent Labs  Lab 01/23/20 0330 01/23/20 0810 01/23/20 1200 01/23/20 1614 01/24/20 0340  GLUCAP 82 105* 100* 118* 136*   D-Dimer No results for input(s): DDIMER in the last 72 hours. Hgb A1c  No results for input(s): HGBA1C in the last 72 hours. Lipid Profile No results for input(s): CHOL, HDL, LDLCALC, TRIG, CHOLHDL, LDLDIRECT in the last 72 hours. Thyroid function studies No results for input(s): TSH, T4TOTAL, T3FREE, THYROIDAB in the last 72 hours.  Invalid input(s): FREET3 Anemia work up No results for input(s): VITAMINB12, FOLATE, FERRITIN, TIBC, IRON, RETICCTPCT in the last 72 hours. Urinalysis    Component Value Date/Time   COLORURINE YELLOW 01/04/2020 2120   APPEARANCEUR CLEAR 01/04/2020 2120   LABSPEC 1.012 01/04/2020 2120   PHURINE 5.0 01/04/2020 2120   GLUCOSEU 50 (A) 01/04/2020 2120   GLUCOSEU NEG mg/dL 12/05/2009 1042   HGBUR SMALL (A) 01/04/2020 2120   BILIRUBINUR NEGATIVE 01/04/2020 2120   KETONESUR NEGATIVE 01/04/2020 2120   PROTEINUR >=300 (A) 01/04/2020 2120   UROBILINOGEN 0.2 09/15/2013 1059   NITRITE NEGATIVE 01/04/2020 2120   LEUKOCYTESUR NEGATIVE 01/04/2020 2120   [facility] Invalid input(s): PROCALCITONIN,  WBC,  LACTICIDVEN Microbiology Recent Results (from the past 240 hour(s))  SARS CORONAVIRUS 2 (TAT  6-24 HRS) Nasopharyngeal Nasopharyngeal Swab     Status: None   Collection Time: 01/23/20  3:40 AM   Specimen: Nasopharyngeal Swab  Result Value Ref Range Status   SARS Coronavirus 2 NEGATIVE NEGATIVE Final    Comment: (NOTE) SARS-CoV-2 target nucleic acids are NOT DETECTED.  The SARS-CoV-2 RNA is generally detectable in upper and lower respiratory specimens during the acute phase of infection. Negative results do not preclude SARS-CoV-2 infection, do not rule out co-infections with other pathogens, and should not be used as the sole basis for treatment or other patient management decisions. Negative results must be combined with clinical observations, patient history, and epidemiological information. The expected result is Negative.  Fact Sheet for Patients: SugarRoll.be  Fact Sheet for Healthcare Providers: https://www.woods-mathews.com/  This test is not yet approved or cleared by the [place] FDA and  has been authorized for detection and/or diagnosis of SARS-CoV-2 by FDA under an Emergency Use Authorization (EUA). This EUA will remain  in effect (meaning this test can be used) for the duration of the COVID-19 declaration under Se ction 564(b)(1) of the Act, 21 U.S.C. section 360bbb-3(b)(1), unless the authorization is terminated or revoked sooner.  Performed at [facility], [place] [address], [place], [place] [address]      Time coordinating discharge: Over 30 minutes  SIGNED:   Braelon Sprung J [place] DO  [facility] 01/24/2020, 10:18 AM

## 2020-01-28 ENCOUNTER — Encounter (HOSPITAL_COMMUNITY): Payer: Self-pay

## 2020-01-28 ENCOUNTER — Emergency Department (HOSPITAL_COMMUNITY): Payer: Medicare Other

## 2020-01-28 ENCOUNTER — Emergency Department (HOSPITAL_COMMUNITY)
Admission: EM | Admit: 2020-01-28 | Discharge: 2020-01-29 | Disposition: A | Discharge status: Home / Self Care | Payer: Medicare Other | Attending: Emergency Medicine | Admitting: Emergency Medicine

## 2020-01-28 DIAGNOSIS — D631 Anemia in chronic kidney disease: Secondary | ICD-10-CM

## 2020-01-28 DIAGNOSIS — F17210 Nicotine dependence, cigarettes, uncomplicated: Secondary | ICD-10-CM | POA: Insufficient documentation

## 2020-01-28 DIAGNOSIS — N189 Chronic kidney disease, unspecified: Secondary | ICD-10-CM | POA: Insufficient documentation

## 2020-01-28 DIAGNOSIS — Y929 Unspecified place or not applicable: Secondary | ICD-10-CM | POA: Diagnosis not present

## 2020-01-28 DIAGNOSIS — I129 Hypertensive chronic kidney disease with stage 1 through stage 4 chronic kidney disease, or unspecified chronic kidney disease: Secondary | ICD-10-CM | POA: Insufficient documentation

## 2020-01-28 DIAGNOSIS — R519 Headache, unspecified: Secondary | ICD-10-CM | POA: Insufficient documentation

## 2020-01-28 DIAGNOSIS — Y939 Activity, unspecified: Secondary | ICD-10-CM | POA: Diagnosis not present

## 2020-01-28 DIAGNOSIS — N186 End stage renal disease: Secondary | ICD-10-CM

## 2020-01-28 DIAGNOSIS — E1122 Type 2 diabetes mellitus with diabetic chronic kidney disease: Secondary | ICD-10-CM | POA: Insufficient documentation

## 2020-01-28 DIAGNOSIS — W1839XA Other fall on same level, initial encounter: Secondary | ICD-10-CM | POA: Diagnosis not present

## 2020-01-28 DIAGNOSIS — M4622 Osteomyelitis of vertebra, cervical region: Secondary | ICD-10-CM

## 2020-01-28 DIAGNOSIS — Y999 Unspecified external cause status: Secondary | ICD-10-CM | POA: Insufficient documentation

## 2020-01-28 DIAGNOSIS — W19XXXA Unspecified fall, initial encounter: Secondary | ICD-10-CM

## 2020-01-28 LAB — CBC WITH DIFFERENTIAL/PLATELET
Abs Immature Granulocytes: 0.02 10*3/uL (ref 0.00–0.07)
Basophils Absolute: 0 10*3/uL (ref 0.0–0.1)
Basophils Relative: 1 %
Eosinophils Absolute: 0.2 10*3/uL (ref 0.0–0.5)
Eosinophils Relative: 3 %
HCT: 22.4 % — ABNORMAL LOW (ref 39.0–52.0)
Hemoglobin: 7.2 g/dL — ABNORMAL LOW (ref 13.0–17.0)
Immature Granulocytes: 0 %
Lymphocytes Relative: 18 %
Lymphs Abs: 1.1 10*3/uL (ref 0.7–4.0)
MCH: 27.1 pg (ref 26.0–34.0)
MCHC: 32.1 g/dL (ref 30.0–36.0)
MCV: 84.2 fL (ref 80.0–100.0)
Monocytes Absolute: 0.5 10*3/uL (ref 0.1–1.0)
Monocytes Relative: 8 %
Neutro Abs: 4.4 10*3/uL (ref 1.7–7.7)
Neutrophils Relative %: 70 %
Platelets: 257 10*3/uL (ref 150–400)
RBC: 2.66 MIL/uL — ABNORMAL LOW (ref 4.22–5.81)
RDW: 16.8 % — ABNORMAL HIGH (ref 11.5–15.5)
WBC: 6.2 10*3/uL (ref 4.0–10.5)
nRBC: 0 % (ref 0.0–0.2)

## 2020-01-28 LAB — BASIC METABOLIC PANEL
Anion gap: 11 (ref 5–15)
BUN: 26 mg/dL — ABNORMAL HIGH (ref 8–23)
CO2: 28 mmol/L (ref 22–32)
Calcium: 7.6 mg/dL — ABNORMAL LOW (ref 8.9–10.3)
Chloride: 96 mmol/L — ABNORMAL LOW (ref 98–111)
Creatinine, Ser: 7.94 mg/dL — ABNORMAL HIGH (ref 0.61–1.24)
GFR calc Af Amer: 7 mL/min — ABNORMAL LOW (ref >60)
GFR calc non Af Amer: 6 mL/min — ABNORMAL LOW (ref >60)
Glucose, Bld: 93 mg/dL (ref 70–99)
Potassium: 3.8 mmol/L (ref 3.5–5.1)
Sodium: 135 mmol/L (ref 135–145)

## 2020-01-28 IMAGING — CT CT HEAD W/O CM
3 of 5 series · 14 of 47 positions shown, 16 images · non-contrast
Comparison: Radiograph [DATE], MRI [DATE], CT brain and MRI
brain [DATE], CT brain [DATE]

CLINICAL DATA: Fell after dialysis hit back of head

EXAM:
CT HEAD WITHOUT CONTRAST
CT CERVICAL SPINE WITHOUT CONTRAST
TECHNIQUE: Multidetector CT imaging of the head and cervical spine was
performed following the standard protocol without intravenous
contrast. Multiplanar CT image reconstructions of the cervical spine
were also generated.

[Series 3: head 2.0 h70h · axial · 0.47mm/px · z∈[-138,-2]mm · 8 of 88 slices shown, 10 images]
[im 10/88  brain]
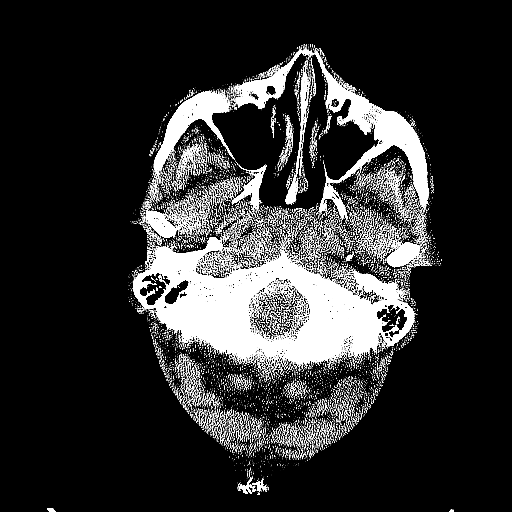
[im 10/88  bone]
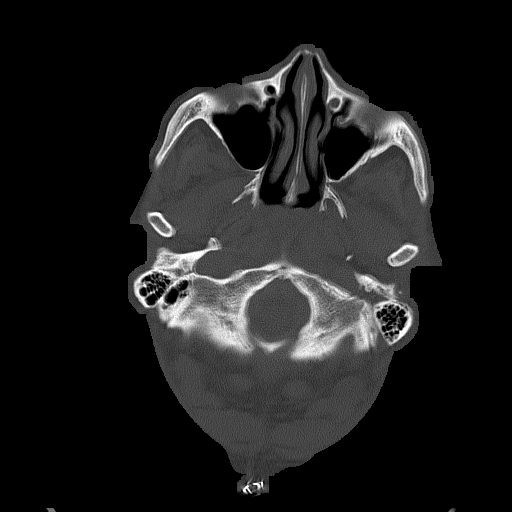
[im 20/88  brain]
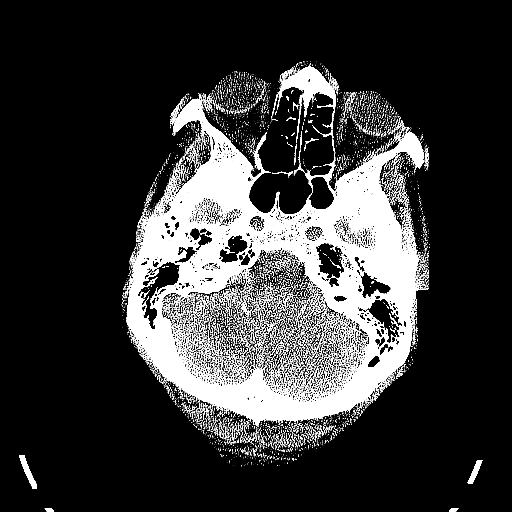
[im 30/88  brain]
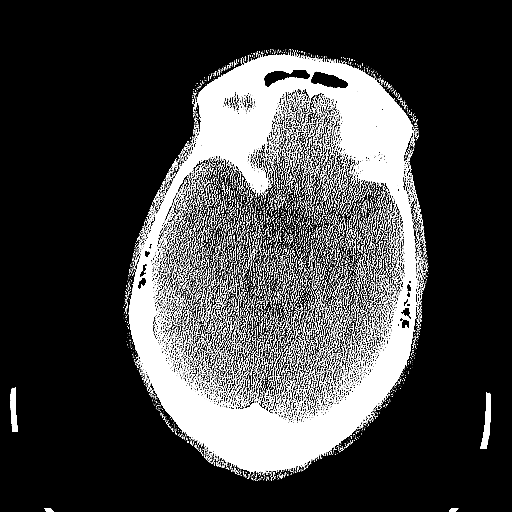
[im 39/88  brain]
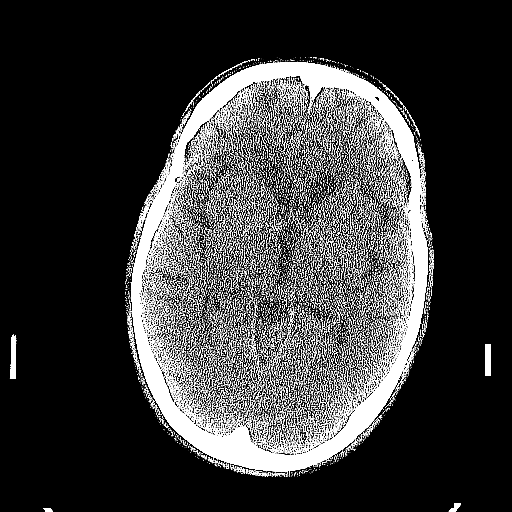
[im 49/88  brain]
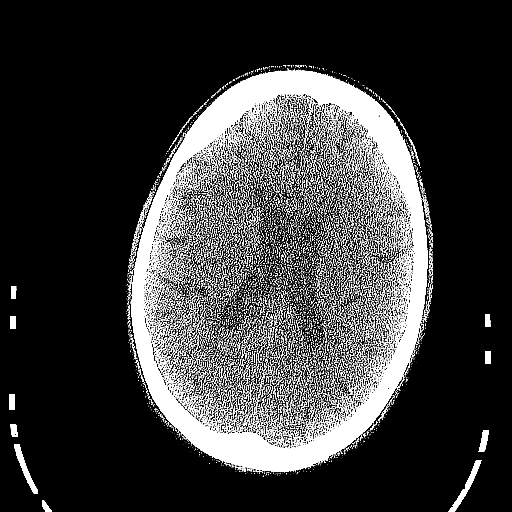
[im 49/88  bone]
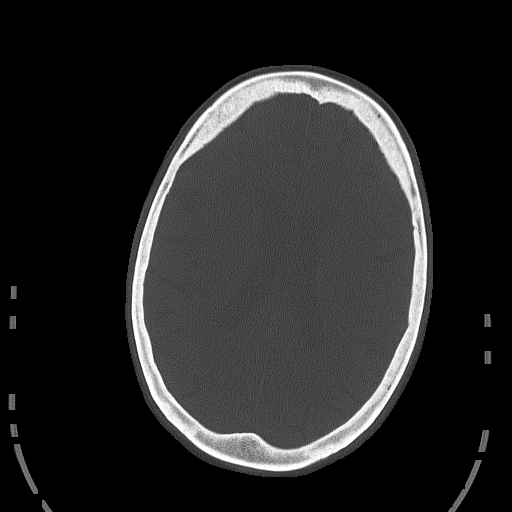
[im 59/88  brain]
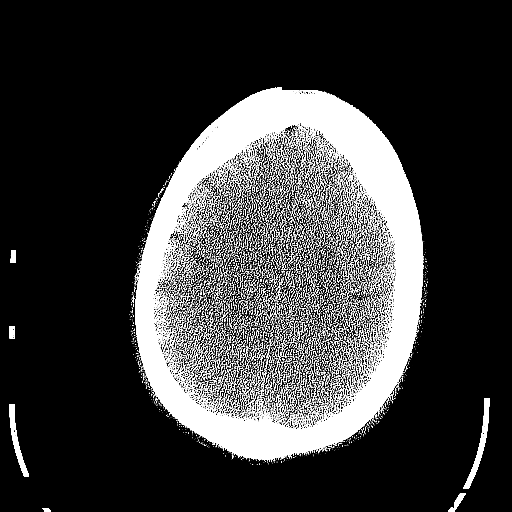
[im 68/88  brain]
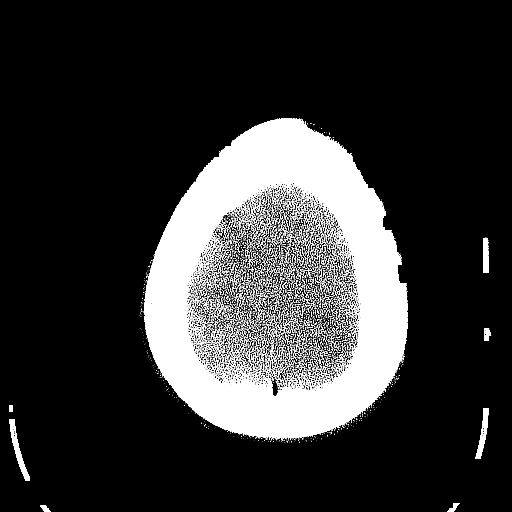
[im 78/88  brain]
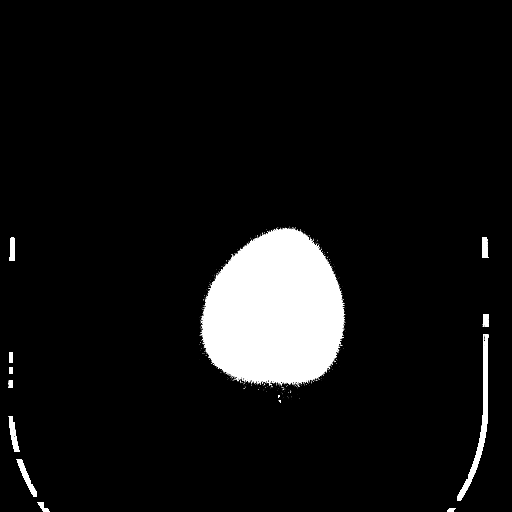

[Series 5: head 3.0 mpr cor · coronal · 0.34mm/px · 3 of 75 slices shown]
[im 25/75  brain]
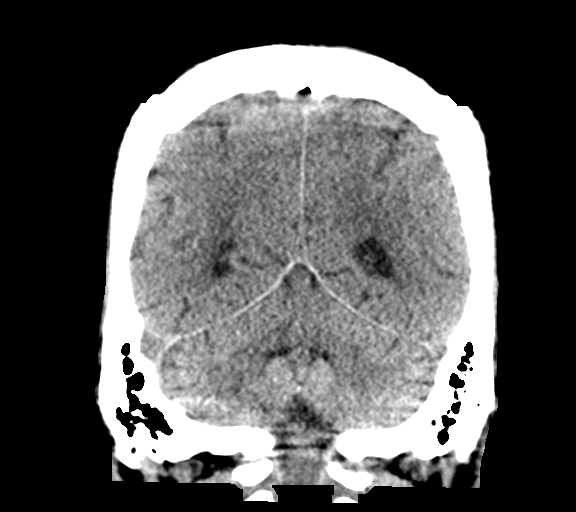
[im 33/75  brain]
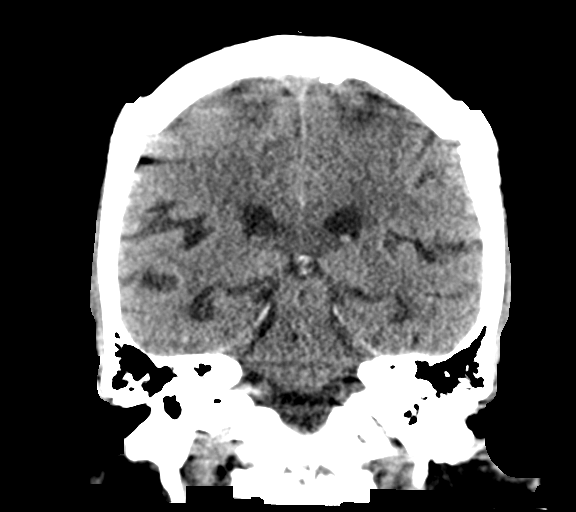
[im 42/75  brain]
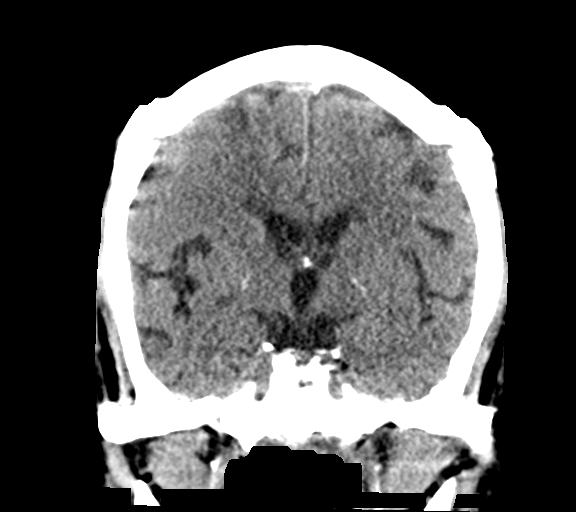

[Series 6: head 3.0 mpr sag · sagittal · 0.34mm/px · 3 of 67 slices shown]
[im 23/67  brain]
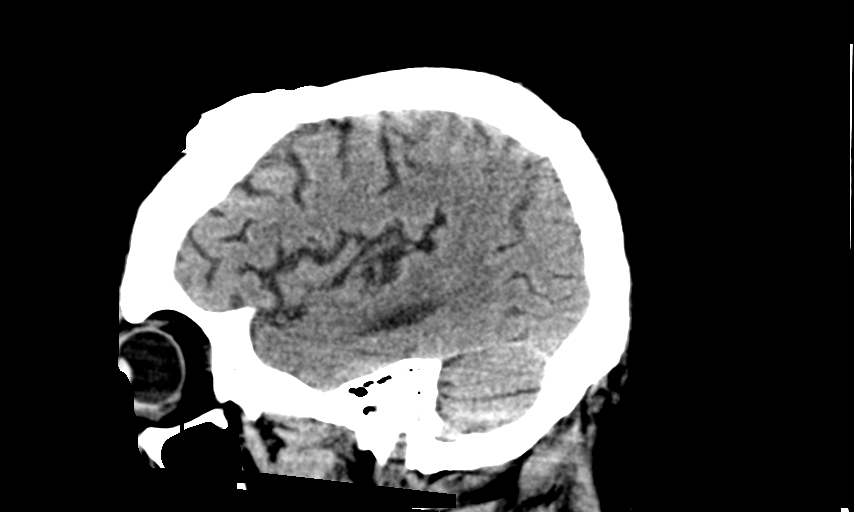
[im 34/67  brain]
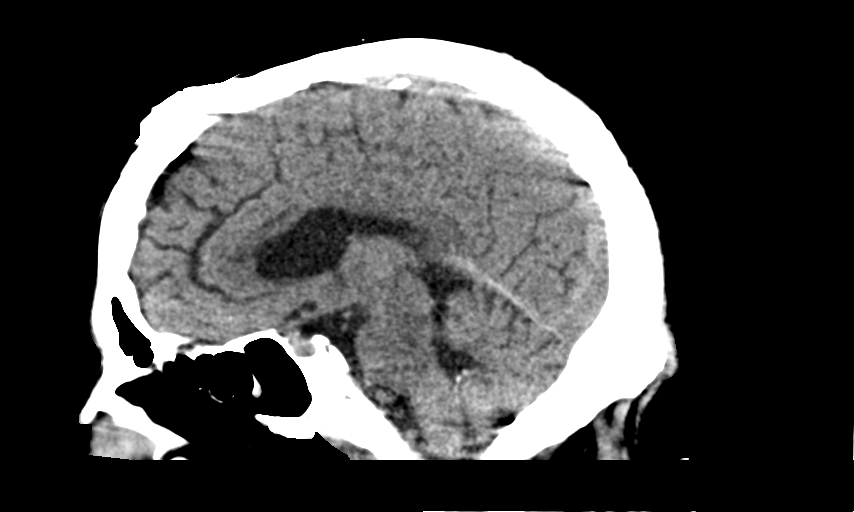
[im 45/67  brain]
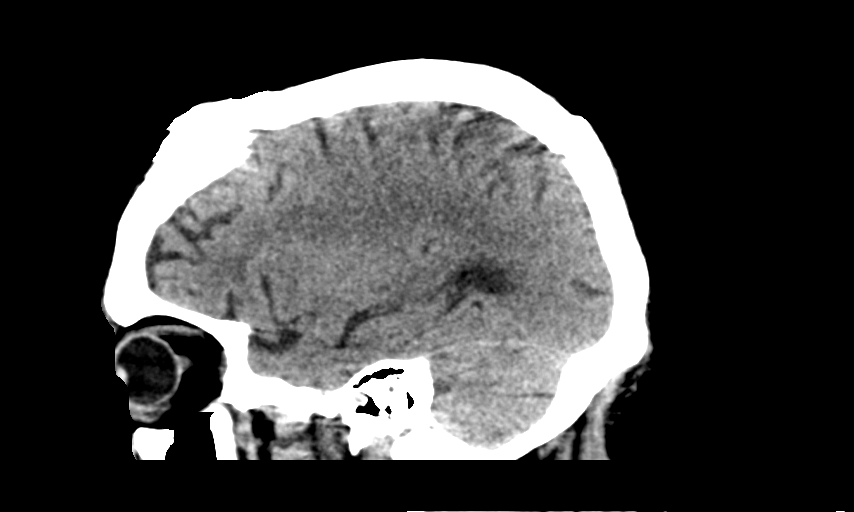

[14 of 47 positions shown; findings below may reference images not displayed]

FINDINGS: CT HEAD FINDINGS

Brain: No acute territorial infarction, hemorrhage, or intracranial
mass is visualized. Mild atrophy. Moderate hypodensity in the white
matter consistent with chronic small vessel ischemic change. Chronic
lacunar infarct in the right thalamus and pons. Stable ventricle
size

Vascular: No hyperdense vessels.  Carotid vascular calcification.

Skull: No fracture

Sinuses/Orbits: No acute finding.

Other: None

CT CERVICAL SPINE FINDINGS

Alignment: Reversal of cervical lordosis trace retrolisthesis C4 on
C5. Facet alignment is maintained.

Skull base and vertebrae: New erosive changes at the anterior arch
of C1 and the dens of C2. There are also new erosive changes and
sclerosis at the left atlanto occipital articulation.

Soft tissues and spinal canal: Marked prevertebral soft tissue
swelling with possible prevertebral fluid anterior to C2, series 5,
image 38, though difficult to further characterize without contrast.
Abnormal epidural density posterior to C2.

Disc levels: Patient is status post posterior rods C4 through T1
with transpedicular screws at C4, C5, C6 and T1. Advanced
degenerative changes at C6-C7 and C7-T1 with mild degenerative
changes elsewhere in the cervical spine.

Upper chest: Negative.

Other: None
IMPRESSION: 1. No CT evidence for acute intracranial abnormality. Atrophy and
chronic small vessel ischemic change of the white matter.
2. New erosive changes at the anterior arch of C1 and the dens of
C2, concerning for osteomyelitis. Erosive change also present at the
left atlantooccipital articulation, concerning for infection. Marked
prevertebral soft tissue swelling with possible prevertebral fluid
collection/abscess though further characterisation limited without
contrast. Abnormal epidural density at C2, probably representing
residual epidural abscess. MRI evaluation is recommended.
3. Posterior rods C4 through T1. Advanced degenerative changes at
C6-C7 and C7-T1.

Critical Value/emergent results were called by telephone at the time
of interpretation on [DATE] at [DATE] to provider IHIMS
IHIMS , who verbally acknowledged these results.

## 2020-01-28 IMAGING — CT CT CERVICAL SPINE W/O CM
3 of 4 series · 13 of 33 positions shown, 16 images · non-contrast
Comparison: Radiograph [DATE], MRI [DATE], CT brain and MRI
brain [DATE], CT brain [DATE]

CLINICAL DATA: Fell after dialysis hit back of head

EXAM:
CT HEAD WITHOUT CONTRAST
CT CERVICAL SPINE WITHOUT CONTRAST
TECHNIQUE: Multidetector CT imaging of the head and cervical spine was
performed following the standard protocol without intravenous
contrast. Multiplanar CT image reconstructions of the cervical spine
were also generated.

[Series 5: c_spine 2.0 st · axial · 0.39mm/px · z∈[-283,-141]mm · 5 of 101 slices shown, 7 images]
[im 15/101  soft-tissue]
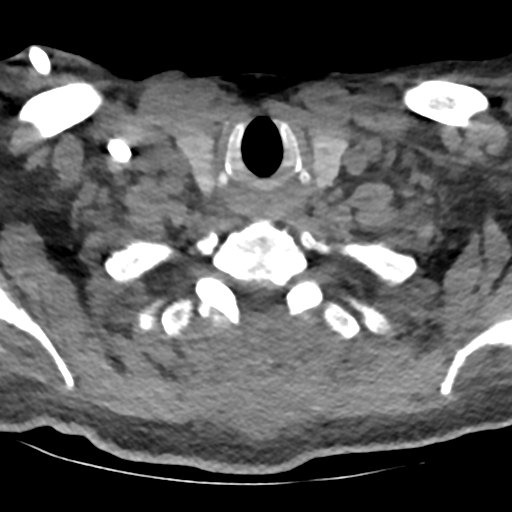
[im 15/101  bone]
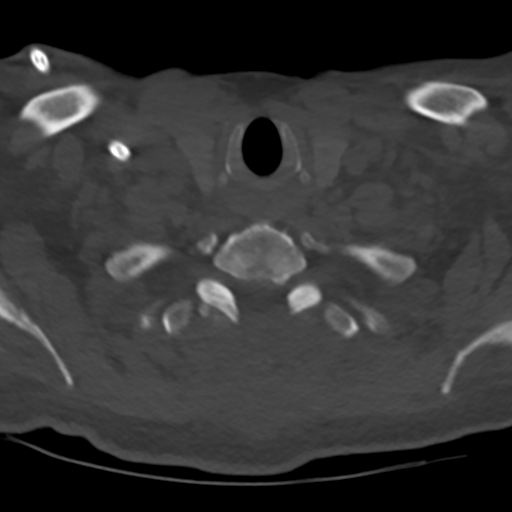
[im 29/101  bone]
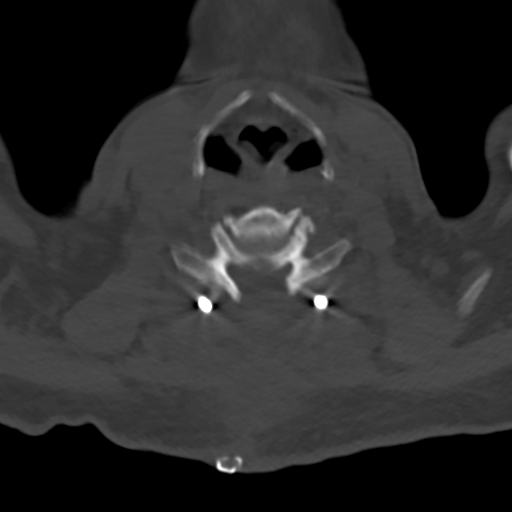
[im 58/101  bone]
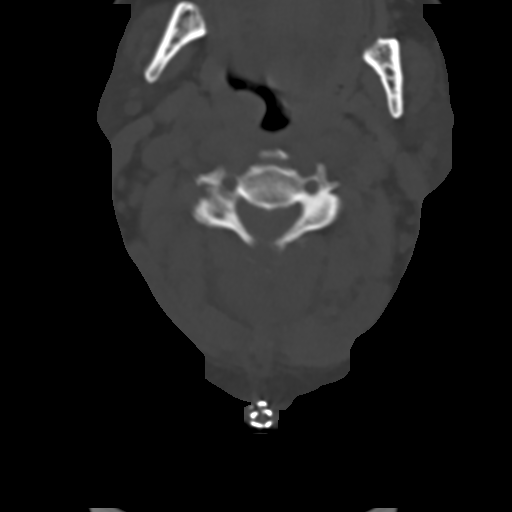
[im 72/101  bone]
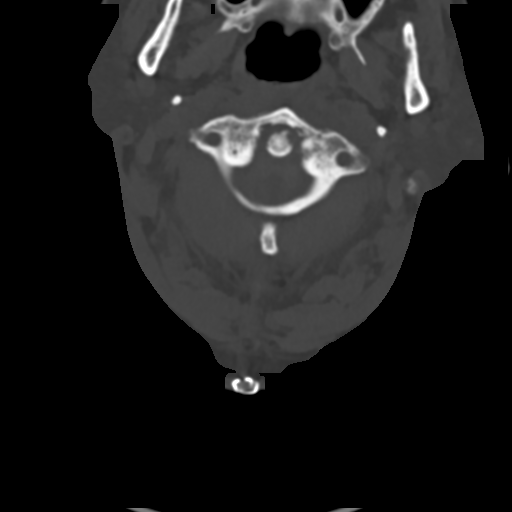
[im 86/101  soft-tissue]
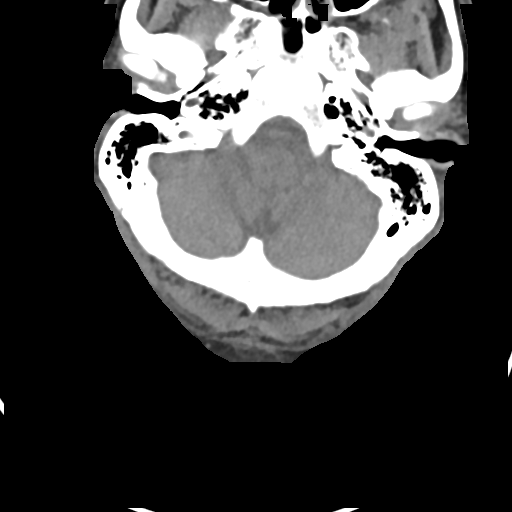
[im 86/101  bone]
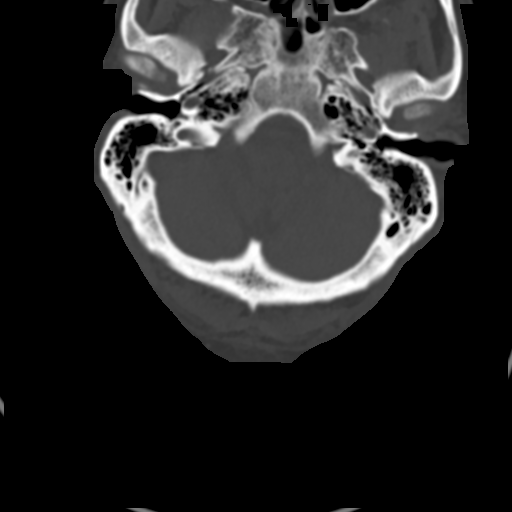

[Series 6: coronal bone · coronal · 0.26mm/px · 3 of 81 slices shown]
[im 18/81  bone]
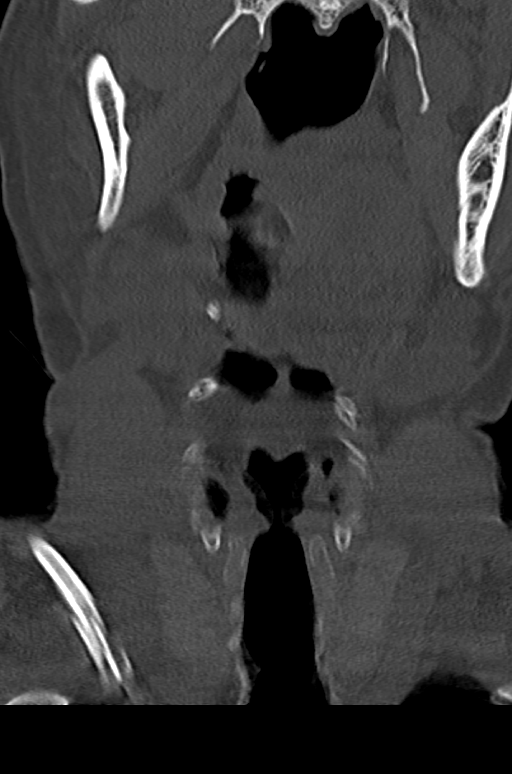
[im 33/81  bone]
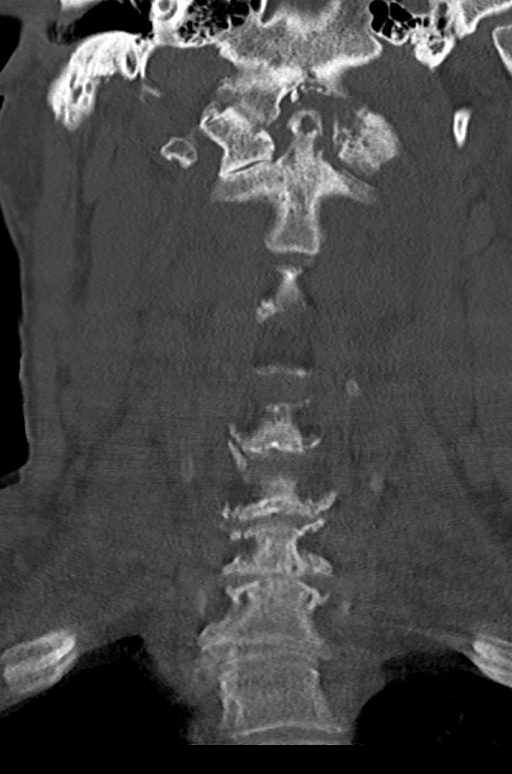
[im 48/81  bone]
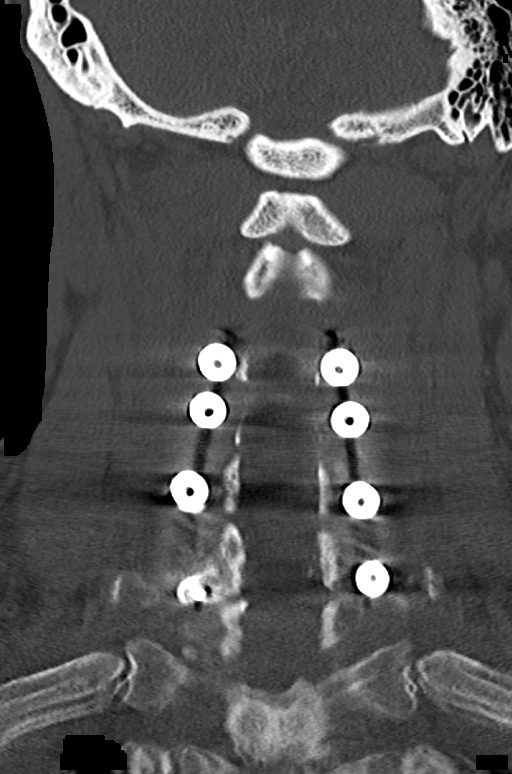

[Series 7: sagittal bone · sagittal · 0.35mm/px · 5 of 77 slices shown, 6 images]
[im 26/77  bone]
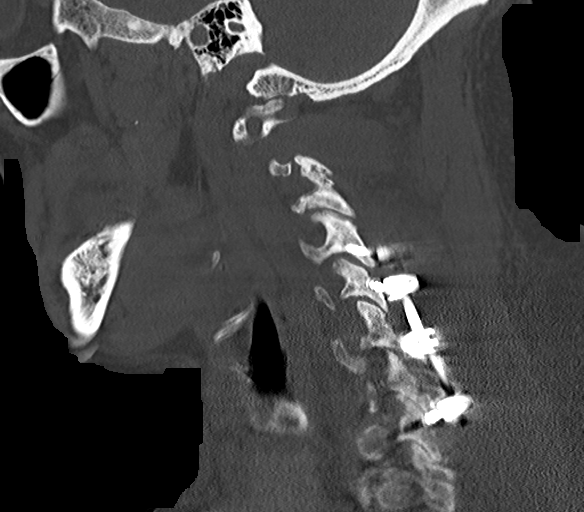
[im 32/77  bone]
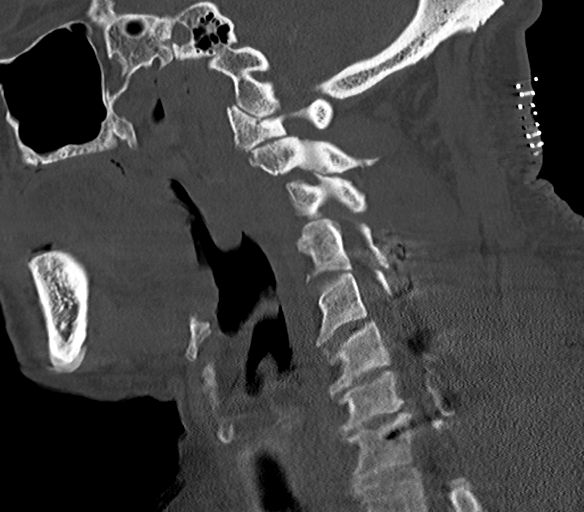
[im 39/77  soft-tissue]
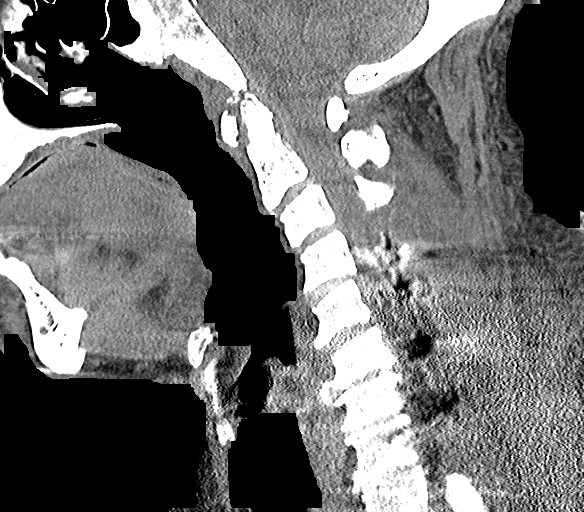
[im 39/77  bone]
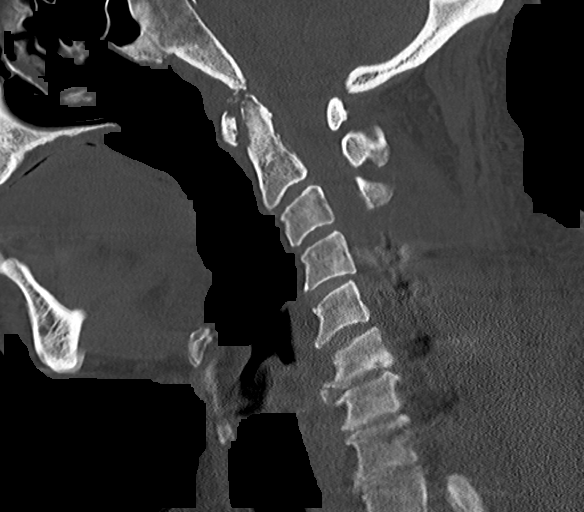
[im 45/77  bone]
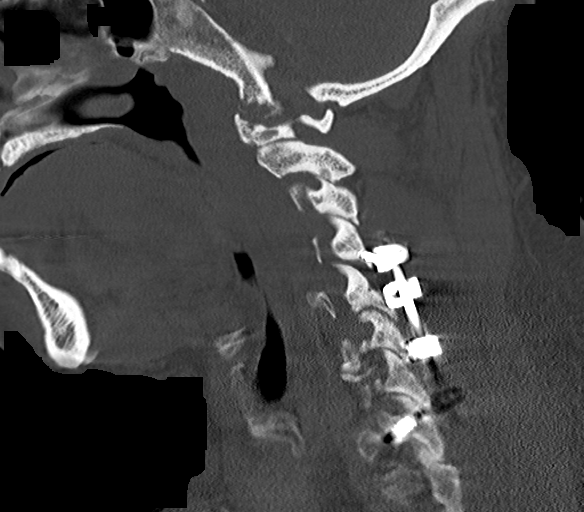
[im 51/77  bone]
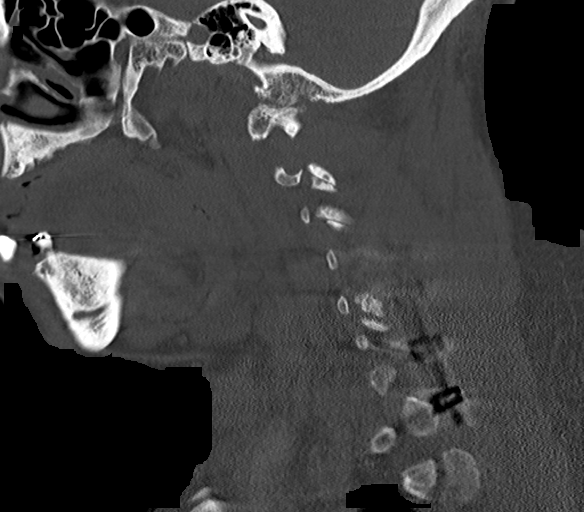

[13 of 33 positions shown; findings below may reference images not displayed]

FINDINGS: CT HEAD FINDINGS

Brain: No acute territorial infarction, hemorrhage, or intracranial
mass is visualized. Mild atrophy. Moderate hypodensity in the white
matter consistent with chronic small vessel ischemic change. Chronic
lacunar infarct in the right thalamus and pons. Stable ventricle
size

Vascular: No hyperdense vessels.  Carotid vascular calcification.

Skull: No fracture

Sinuses/Orbits: No acute finding.

Other: None

CT CERVICAL SPINE FINDINGS

Alignment: Reversal of cervical lordosis trace retrolisthesis C4 on
C5. Facet alignment is maintained.

Skull base and vertebrae: New erosive changes at the anterior arch
of C1 and the dens of C2. There are also new erosive changes and
sclerosis at the left atlanto occipital articulation.

Soft tissues and spinal canal: Marked prevertebral soft tissue
swelling with possible prevertebral fluid anterior to C2, series 5,
image 38, though difficult to further characterize without contrast.
Abnormal epidural density posterior to C2.

Disc levels: Patient is status post posterior rods C4 through T1
with transpedicular screws at C4, C5, C6 and T1. Advanced
degenerative changes at C6-C7 and C7-T1 with mild degenerative
changes elsewhere in the cervical spine.

Upper chest: Negative.

Other: None
IMPRESSION: 1. No CT evidence for acute intracranial abnormality. Atrophy and
chronic small vessel ischemic change of the white matter.
2. New erosive changes at the anterior arch of C1 and the dens of
C2, concerning for osteomyelitis. Erosive change also present at the
left atlantooccipital articulation, concerning for infection. Marked
prevertebral soft tissue swelling with possible prevertebral fluid
collection/abscess though further characterisation limited without
contrast. Abnormal epidural density at C2, probably representing
residual epidural abscess. MRI evaluation is recommended.
3. Posterior rods C4 through T1. Advanced degenerative changes at
C6-C7 and C7-T1.

Critical Value/emergent results were called by telephone at the time
of interpretation on [DATE] at [DATE] to provider IHIMS
IHIMS , who verbally acknowledged these results.

## 2020-01-28 IMAGING — MR MR CERVICAL SPINE W/O CM
6 of 8 series · 26 of 48 positions shown · non-contrast
Comparison: Prior CT from [DATE] as well as prior MRI from
[DATE].

CLINICAL DATA: Initial evaluation for epidural abscess.

EXAM:
MRI CERVICAL SPINE WITHOUT CONTRAST
TECHNIQUE: Multiplanar, multisequence MR imaging of the cervical spine was
performed. No intravenous contrast was administered.

[Series 5: T2 · sagittal · 3.0mm · 0.69mm/px · 1 of 15 slices shown (1 of 3)]
[im 1/15]
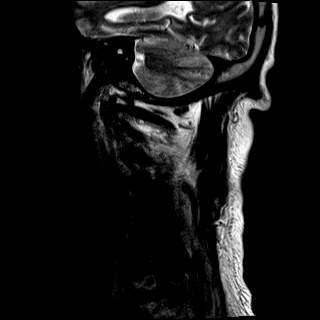

[Series 6: T1 · sagittal · 3.0mm · 0.69mm/px · 2 of 15 slices shown]
[im 1/15]
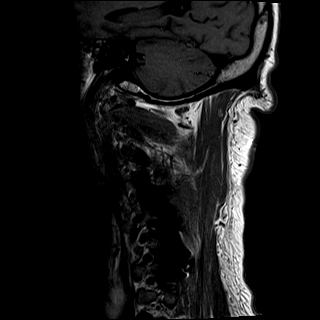
[im 15/15]
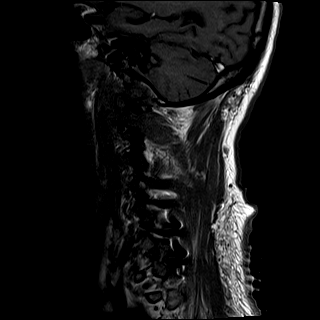

[Series 7: STIR · sagittal · 3.0mm · 0.86mm/px · 2 of 15 slices shown]
[im 1/15]
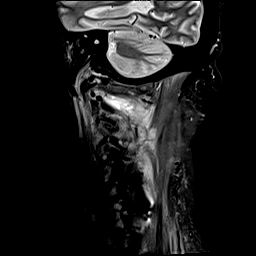
[im 15/15]
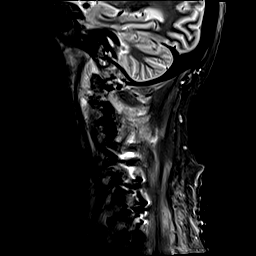

[Series 8: GRE · axial · 3.0mm · 0.39mm/px · z∈[-93,+1]mm · 5 of 40 slices shown]
[im 1/40]
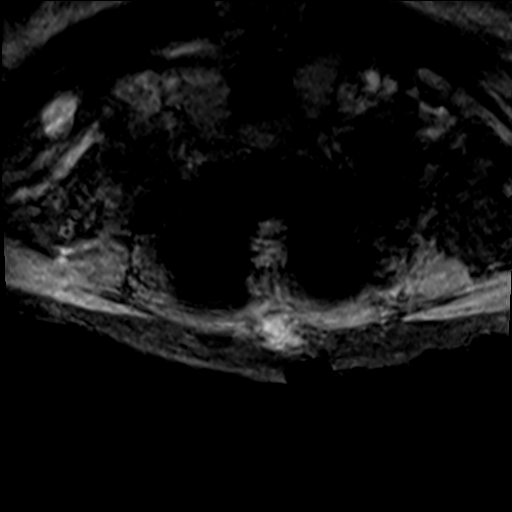
[im 8/40]
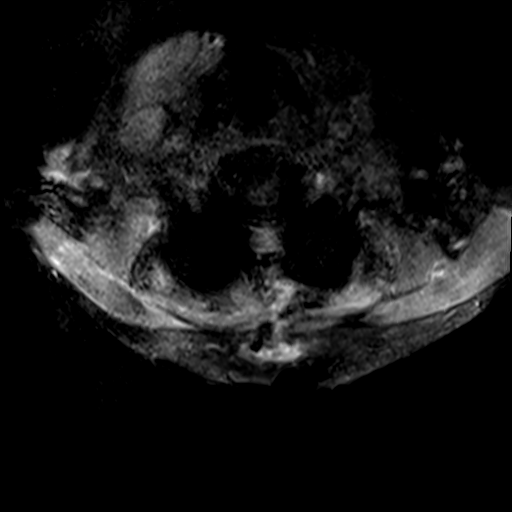
[im 16/40]
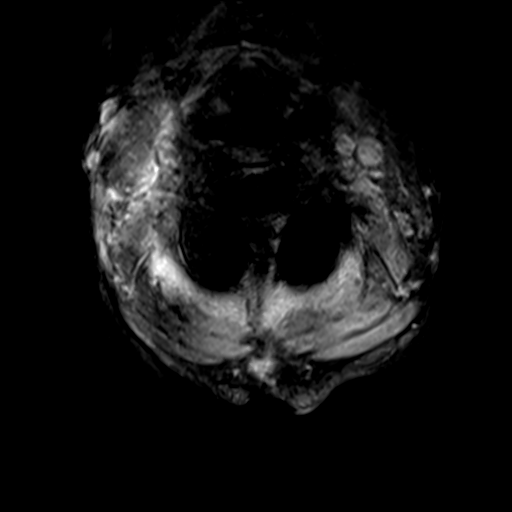
[im 24/40]
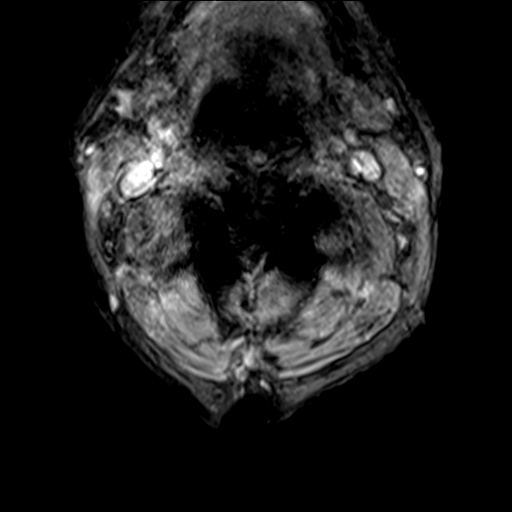
[im 32/40]
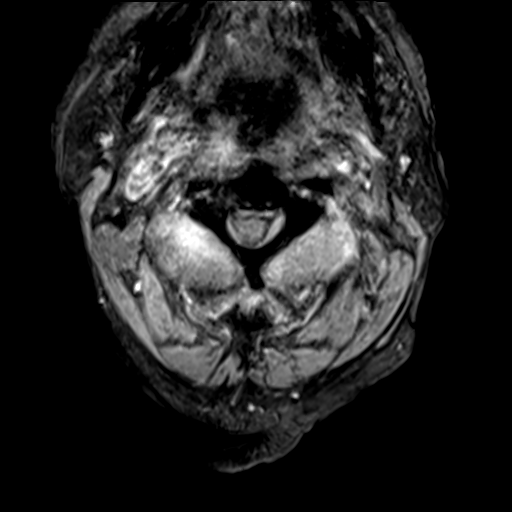

[Series 9: T2 · axial · 3.0mm · 0.66mm/px · z∈[-93,+25]mm · 6 of 40 slices shown (2 of 3)]
[im 1/40]
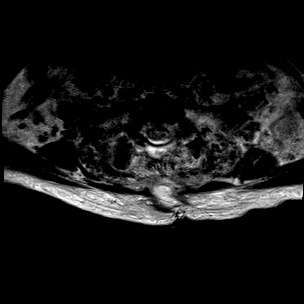
[im 8/40]
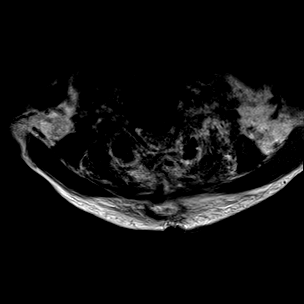
[im 16/40]
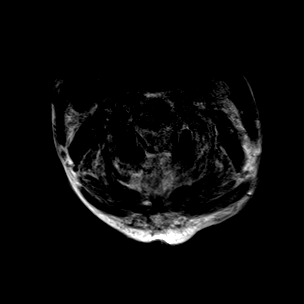
[im 24/40]
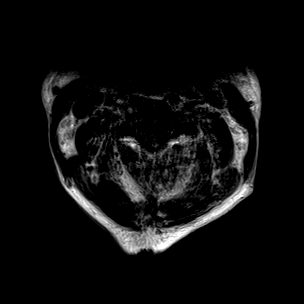
[im 32/40]
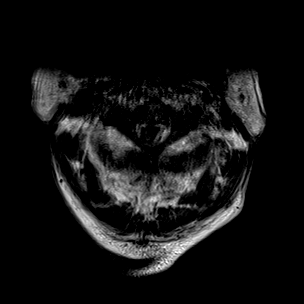
[im 40/40]
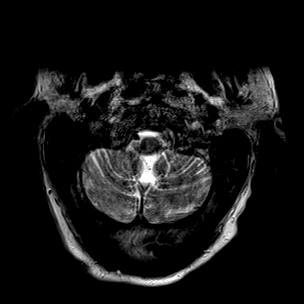

[Series 1033: T2 · axial · 3.0mm · 0.25mm/px · z∈[-101,+97]mm · 10 of 111 slices shown (3 of 3)]
[im 8/111]
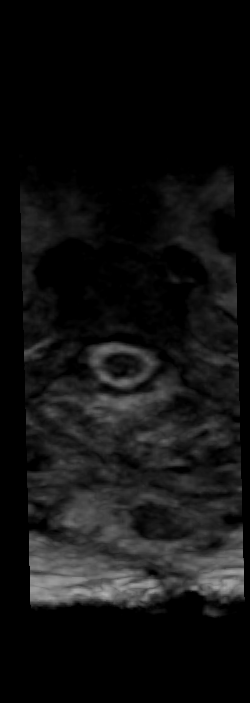
[im 15/111]
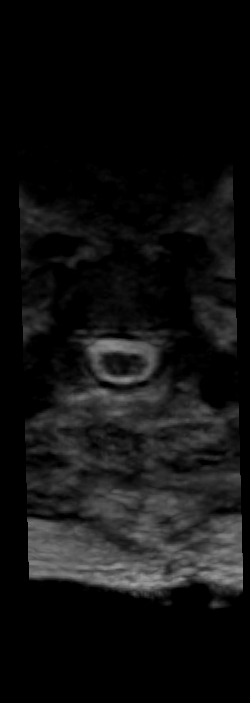
[im 23/111]
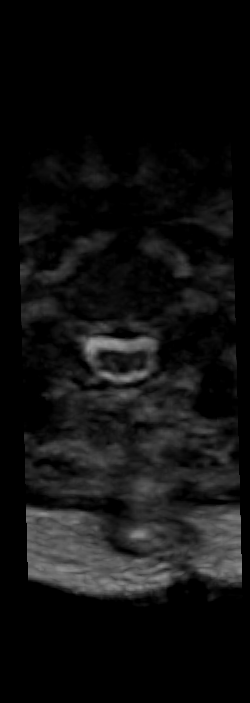
[im 37/111]
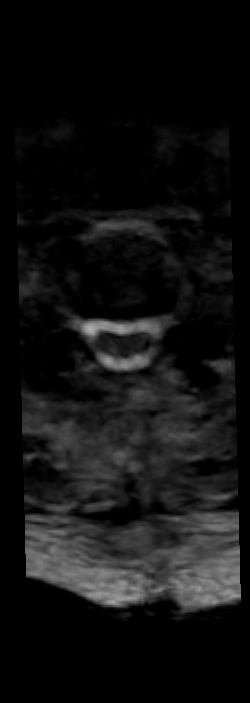
[im 52/111]
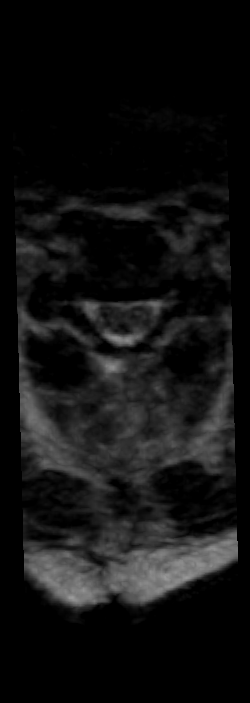
[im 59/111]
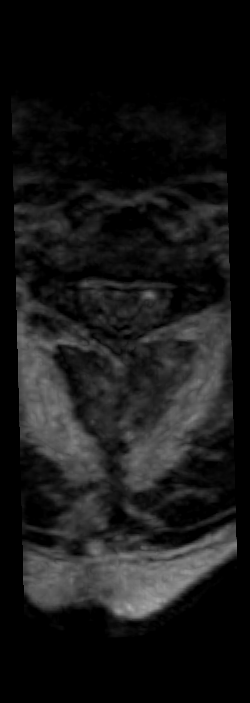
[im 67/111]
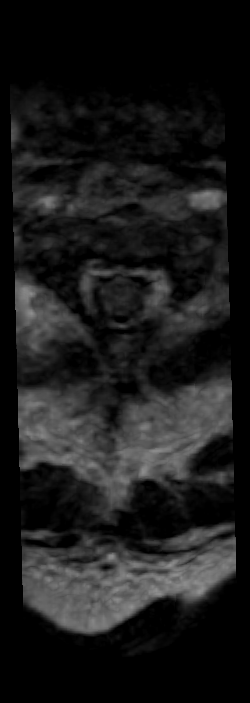
[im 81/111]
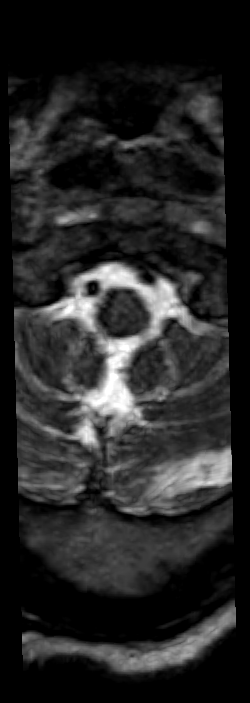
[im 96/111]
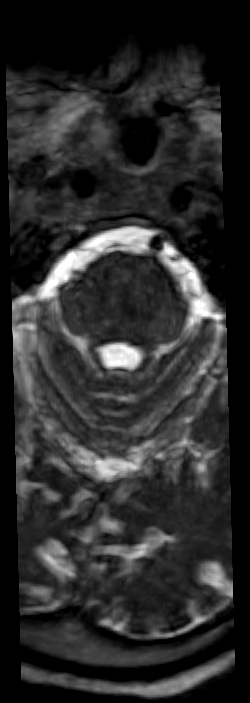
[im 111/111]
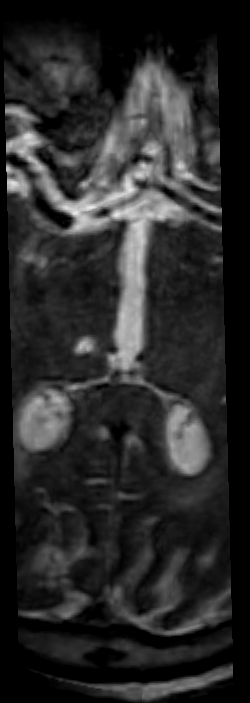

[26 of 48 positions shown; findings below may reference images not displayed]

FINDINGS: Alignment: Examination moderately degraded by motion artifact.

Straightening with smooth reversal of the normal cervical lordosis.
Trace retrolisthesis of C4 on C5, with trace anterolisthesis of C5
on C6.

Vertebrae: Postoperative changes from recent C4 through T2
laminectomy with C4 through T1 posterior arthrodesis for debridement
of previously identified epidural abscess. Vertebral body height
maintained without interval fracture.

Abnormal marrow edema with erosive changes seen involving the left
greater than right atlantooccipital articulations, concerning for
osteomyelitis and septic arthritis. Progressive erosive changes
about the dens and anterior arch of C1, also consistent with
infection. Changes are new/progressed as compared to prior MRI.
Surrounding soft tissue swelling and edema extending from the skull
base to approximately C6, consistent with associated phlegmon.
Probable small collections seen involving the prevertebral soft
tissues at the level of C2, largest of which measures 10 mm on the
left (series [67], image 46). Circumferential epidural collection
extending from the foramen magnum to approximately C3-4 consistent
with persistent epidural abscess (series 10, image 30 on sagittal
view, series [67], image 44 on axial view). Resultant moderate
diffuse spinal stenosis at these levels. No frank cord impingement
for compression.

Distally, there is increased fluid signal intensity seen within the
C5-6, C6-7, and C7-T1 interspace is, new from prior, and suspicious
for associated discitis (series 7, image 8). No significant erosion
or edema within the adjacent endplates.

Cord: No definite cord signal abnormality to suggest myelopathy or
edema on this motion degraded exam.

Posterior Fossa, vertebral arteries, paraspinal tissues: Remote
right pontine lacunar infarct noted. Associated age-related cerebral
atrophy. Extensive edema throughout the posterior paraspinous soft
tissues extending from the skull base to approximately T2, which
could be related to postoperative changes and/or soft tissue
infection. Small collection measuring approximately 1.6 x 1.3 x
cm seen along the midline incision (series 9, image 40).
Prevertebral swelling and phlegmon with superimposed small
prevertebral collections as above. Normal flow voids preserved
within the vertebral arteries bilaterally.

Disc levels:

C1-2: Circumferential epidural abscess with effacement of the thecal
sac. Resultant moderate to severe spinal stenosis without frank cord
compression (series [67], image 44).

C2-C3: No significant disc bulge. Circumferential epidural abscess
with resultant moderate spinal stenosis.

C3-C4: Diffuse disc bulge with bilateral uncovertebral hypertrophy.
Resultant mild-to-moderate spinal stenosis. Moderate right with mild
left C4 foraminal narrowing.

C4-C5: Small central disc protrusion indents the ventral thecal sac
(series [67], image 68). Prior posterior decompression and fusion.
No significant spinal stenosis or cord deformity. Foramina appear
grossly patent.

C5-C6: Mild diffuse disc bulge with uncovertebral hypertrophy. Prior
posterior decompression with fusion. No residual spinal stenosis.
Foramina appear grossly patent.

C6-C7: Degenerative intervertebral disc space narrowing with diffuse
disc osteophyte, slightly asymmetric to the left. Disc osteophyte
indents and partially effaces the left ventral thecal sac. Prior
posterior decompression and fusion. No significant spinal stenosis.
Severe left C7 foraminal narrowing. No significant right foraminal
stenosis.

C7-T1: Mild disc bulge with uncovertebral hypertrophy. Prior
posterior decompression with fusion. No residual spinal stenosis.
Foramina appear grossly patent.
IMPRESSION: 1. Postoperative changes from recent C4-T2 laminectomy with C4-T1
posterior arthrodesis for debridement of previously identified
epidural abscess.
2. Abnormal marrow edema with erosive changes involving the
atlantooccipital and C1-2 articulations, consistent with
osteomyelitis and septic arthritis.
3. Persistent circumferential epidural abscess extending from the
foramen magnum to approximately C3-4, with resultant moderate to
severe spinal stenosis. No frank cord compression or cord signal
changes.
4. Extensive edema and phlegmonous change involving the prevertebral
soft tissues extending from the clivus to approximately C6,
consistent with infection. Superimposed small prevertebral
collections measuring up to 10 mm at the level of C2 as above. Edema
involving the posterior paraspinous soft tissues could reflect
postoperative change and/or infection. Superimposed 1.6 x 1.3 x
cm collection along the posterior midline incision.
5. Increased fluid signal intensity within the C5-6, C6-7, and C7-T1
interspace, nonspecific, but could reflect associated discitis.

## 2020-01-28 NOTE — ED Notes (Signed)
[name] daughter [phone] would like an update on the pt

## 2020-01-28 NOTE — ED Notes (Signed)
ED Provider at bedside. 

## 2020-01-28 NOTE — ED Provider Notes (Addendum)
[facility] EMERGENCY DEPARTMENT Provider Note  CSN: [id number] Arrival date & time: 01/28/20 1816    History Chief Complaint  Patient presents with  . Fall    HPI  [name]. is a 69 y.o. male brought to the ED via EMS after a fall as he was leaving dialysis today. He reports he lost his balance which has been a problem for him recently. He is unsure if he hit his head. He was initially going to go home, but the transportation Lucianne Lei was taking too long and so he requested EMS to bring him to the ED. He reports a headache that has been present for several weeks. 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 He has otherwise been doing well since discharge.    Past Medical History:  Diagnosis Date  . Anemia   . Arthritis    left knee  . Chronic kidney disease    acute renal failure/injury requiring short-term HD 2013  . Colon cancer ([place])   . Colon polyp    11/2011 - Polyps identified, biopsy - invasive adenocarinoma  . DM II (diabetes mellitus, type II), controlled ([place])    type 2 IDDM x 15 years. A1C 1/09 13.7.   Marland Kitchen 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 12/25/2011  . GERD (gastroesophageal reflux disease)   . Heart murmur   . 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 Hyperlipidemia   . Hypertension    for a few years and resolved in 2013/2014.  . Sleep apnea    does not wear CPAP now  . Wears glasses     Past Surgical History:  Procedure Laterality Date  . APPLICATION OF WOUND VAC  02/16/2012   Procedure: APPLICATION OF WOUND VAC;  Surgeon: Zenovia Jarred, MD;  Location: [facility];  Service: General;  Laterality: N/A;  . APPLICATION OF WOUND VAC  02/26/2012   Procedure: APPLICATION OF WOUND VAC;  Surgeon: Imogene Burn. [name], MD;  Location: [facility];  Service: General;  Laterality: N/A;  . AV FISTULA PLACEMENT  Left 07/29/2019   Procedure: ARTERIOVENOUS (AV) FISTULA CREATION;  Surgeon: Marty Heck, MD;  Location: [facility];  Service: Vascular;  Laterality: Left;  . COLONOSCOPY    . COLONOSCOPY  02/06/2012   Procedure: COLONOSCOPY;  Surgeon: Milus Banister, MD;  Location: [facility];  Service: Endoscopy;;  . COLONOSCOPY WITH PROPOFOL N/A 06/10/2019   Procedure: COLONOSCOPY WITH PROPOFOL;  Surgeon: Milus Banister, MD;  Location: [facility];  Service: Endoscopy;  Laterality: N/A;  . COLOSTOMY REVERSAL    . DRESSING CHANGE UNDER ANESTHESIA  02/18/2012   Procedure: DRESSING CHANGE UNDER ANESTHESIA;  Surgeon: Odis Hollingshead, MD;  Location: [facility];  Service: General;  Laterality: N/A;  . ESOPHAGOGASTRODUODENOSCOPY (EGD) WITH PROPOFOL N/A 06/10/2019   Procedure: ESOPHAGOGASTRODUODENOSCOPY (EGD) WITH PROPOFOL;  Surgeon: Milus Banister, MD;  Location: [facility];  Service: Endoscopy;  Laterality: N/A;  . GANGLION CYST EXCISION  20 YRS AGO   RT ARM  . ILEOSTOMY  02/16/2012   Procedure: ILEOSTOMY;  Surgeon: Zenovia Jarred, MD;  Location: [facility];  Service: General;  Laterality: N/A;  . ILEOSTOMY CLOSURE N/A 09/02/2012   Procedure: ILEOSTOMY REVERSAL;  Surgeon: Imogene Burn. [name], MD;  Location: [facility];  Service: General;  Laterality: N/A;  . ILIOSTOMY    .  INCISION AND DRAINAGE OF WOUND  02/23/2012   Procedure: IRRIGATION AND DEBRIDEMENT WOUND;  Surgeon: Joyice Faster. Cornett, MD;  Location: [facility];  Service: General;  Laterality: N/A;  . IR DIALY SHUNT INTRO [place] W/IMG LEFT Left 01/21/2020  . IR FLUORO GUIDE CV LINE RIGHT  01/21/2020  . IR REMOVAL TUN CV CATH W/O FL  01/07/2020  . IR US GUIDE VASC ACCESS LEFT  01/21/2020  . IR US GUIDE VASC ACCESS RIGHT  01/21/2020  . LAPAROTOMY  02/16/2012   Procedure: EXPLORATORY LAPAROTOMY;  Surgeon: Zenovia Jarred, MD;  Location: [facility];  Service: General;  Laterality: N/A;  Exploratory Laparotomy with resection of anastomosis  . LAPAROTOMY  02/18/2012    Procedure: EXPLORATORY LAPAROTOMY;  Surgeon: Odis Hollingshead, MD;  Location: [facility];  Service: General;  Laterality: N/A;  exploratory laparotomy  change of abdominal vac dressing  . LAPAROTOMY  02/20/2012   Procedure: EXPLORATORY LAPAROTOMY;  Surgeon: Odis Hollingshead, MD;  Location: [facility];  Service: General;  Laterality: N/A;  . LAPAROTOMY  02/23/2012   Procedure: EXPLORATORY LAPAROTOMY;  Surgeon: Joyice Faster. Cornett, MD;  Location: [facility];  Service: General;  Laterality: N/A;  Irrigation and Debridement of abdominal wound with wound vac change with partial closure  . LAPAROTOMY  02/26/2012   Procedure: EXPLORATORY LAPAROTOMY;  Surgeon: Imogene Burn. [name], MD;  Location: [facility];  Service: General;  Laterality: N/A;     . PARTIAL COLECTOMY  02/06/2012  . PARTIAL COLECTOMY  02/06/2012   Procedure: PARTIAL COLECTOMY;  Surgeon: Imogene Burn. [name], MD;  Location: [facility];  Service: General;  Laterality: N/A;  right partial colectomy  . POSTERIOR CERVICAL FUSION/FORAMINOTOMY N/A 01/06/2020   Procedure: CERVICAL FOUR- THORACIC TWO LAMINECTOMY AND FUSION;  Surgeon: Consuella Lose, MD;  Location: [facility];  Service: Neurosurgery;  Laterality: N/A;  CERVICAL FOUR- THORACIC TWO LAMINECTOMY AND FUSION  . RESECTION SMALL BOWEL / CLOSURE ILEOSTOMY  402/2014   Dr [name]  . VACUUM ASSISTED CLOSURE CHANGE  02/20/2012   Procedure: ABDOMINAL VACUUM ASSISTED CLOSURE CHANGE;  Surgeon: Odis Hollingshead, MD;  Location: [place] [facility];  Service: General;;  . VACUUM ASSISTED CLOSURE CHANGE  02/23/2012   Procedure: ABDOMINAL VACUUM ASSISTED CLOSURE CHANGE;  Surgeon: Joyice Faster. Cornett, MD;  Location: [facility];  Service: General;  Laterality: N/A;    Family History  Problem Relation Age of Onset  . Diabetes Father   . Heart disease Father   . Hypertension Mother   . Diabetes Sister   . Heart disease Sister   . Colon cancer Neg Hx     Social History   Tobacco Use  . Smoking status: Current Every Day Smoker    Packs/day: 0.50    Years:  30.00    Pack years: 15.00    Types: Cigarettes  . Smokeless tobacco: Never Used  Substance Use Topics  . Alcohol use: Yes    Comment: Liquor twice monthly.  . Drug use: Yes    Types: Cocaine    Comment: Crack Cocaine used last night (01/03/20)     Home Medications Prior to Admission medications   Medication Sig Start Date End Date Taking? Authorizing Provider  amLODipine (NORVASC) 10 MG tablet Take 1 tablet (10 mg total) by mouth at bedtime. 01/24/20 02/23/20  [place] Donnamarie Poag, DO  atorvastatin (LIPITOR) 20 MG tablet Take 1 tablet (20 mg total) by mouth at bedtime. 01/24/20 02/23/20  [place] Eric J, DO  calcitRIOL (ROCALTROL) 0.25 MCG capsule Take 5  capsules (1.25 mcg total) by mouth 3 (three) times a week. 01/24/20 02/23/20  [place] Donnamarie Poag, DO  clonazePAM (KLONOPIN) 0.5 MG tablet Take 1 tablet (0.5 mg total) by mouth 2 (two) times daily as needed (Anxiety/agitation). 01/24/20 02/23/20  [place] Donnamarie Poag, DO  Darbepoetin Alfa (ARANESP) 100 MCG/0.5ML SOSY injection Inject 0.5 mLs (100 mcg total) into the vein every Monday with hemodialysis. 01/24/20   [place] Donnamarie Poag, DO  HYDROcodone-acetaminophen (NORCO/VICODIN) 5-325 MG tablet Take 1 tablet by mouth every 6 (six) hours. 01/24/20   [place] Eric J, DO  insulin detemir (LEVEMIR) 100 UNIT/ML injection Inject 0.11 mLs (11 Units total) into the skin 2 (two) times daily. 01/24/20 02/23/20  [place] Donnamarie Poag, DO  lidocaine-prilocaine (EMLA) cream Apply 1 application topically once. Apply to needle site 1 hr. Prior to cannulation for hemodialysis.    [provider]  metoprolol tartrate (LOPRESSOR) 25 MG tablet Take 1 tablet (25 mg total) by mouth 2 (two) times daily. 01/24/20 02/23/20  [place] Eric J, DO  multivitamin (RENA-VIT) TABS tablet Take 1 tablet by mouth at bedtime. 01/24/20 02/23/20  [place] Donnamarie Poag, DO  polyethylene glycol (MIRALAX / GLYCOLAX) 17 g packet Take 17 g by mouth daily as needed for moderate constipation or severe constipation.  01/24/20   [place] Eric J, DO  QUEtiapine (SEROQUEL) 25 MG tablet Take 0.5 tablets (12.5 mg total) by mouth at bedtime. 01/24/20 02/23/20  [place] Donnamarie Poag, DO  rifampin (RIFADIN) 300 MG capsule Take 1 capsule (300 mg total) by mouth every 12 (twelve) hours. 01/24/20 03/03/20  [place] Eric J, DO  sevelamer carbonate (RENVELA) 800 MG tablet Take 2 tablets (1,600 mg total) by mouth 3 (three) times daily with meals. 01/24/20 02/23/20  [place] Donnamarie Poag, DO  Vancomycin [facility]) 750-5 MG/150ML-% SOLN Inject 150 mLs (750 mg total) into the vein every Monday, Wednesday, and Friday with hemodialysis. 01/24/20   [place] Eric J, DO     Allergies    Patient has no known allergies.   Review of Systems   Review of Systems A comprehensive review of systems was completed and negative except as noted in HPI.    Physical Exam BP (!) 157/68 (BP Location: Right Arm)   Pulse 84   Temp 99.8 F (37.7 C) (Oral)   Resp 17   Ht 5' 11.5" (1.816 m)   Wt 86.2 kg   SpO2 93%   BMI 26.13 kg/m   Physical Exam Vitals and nursing note reviewed.  Constitutional:      Appearance: Normal appearance.  HENT:     Head: Normocephalic and atraumatic.     Nose: Nose normal.     Mouth/Throat:     Mouth: Mucous membranes are moist.  Eyes:     Extraocular Movements: Extraocular movements intact.     Conjunctiva/sclera: Conjunctivae normal.  Neck:     Comments: Surgical wound in midline posterior cervical and upper thoracic spine clean dry and intact with staples in place Cardiovascular:     Rate and Rhythm: Normal rate.  Pulmonary:     Effort: Pulmonary effort is normal.     Breath sounds: Normal breath sounds.  Abdominal:     General: Abdomen is flat.     Palpations: Abdomen is soft.     Tenderness: There is no abdominal tenderness.  Musculoskeletal:        General: No swelling. Normal range of motion.     Cervical back: Neck supple.     Comments: Dialysis access in LUE  with palpable thrill  Skin:    General:  Skin is warm and dry.  Neurological:     General: No focal deficit present.     Mental Status: He is alert.  Psychiatric:        Mood and Affect: Mood normal.      ED Results / Procedures / Treatments   Labs (all labs ordered are listed, but only abnormal results are displayed) Labs Reviewed - No data to display  EKG EKG Interpretation  Date/Time:  Friday January 28 2020 18:53:36 EDT Ventricular Rate:  87 PR Interval:    QRS Duration: 89 QT Interval:  396 QTC Calculation: 477 R Axis:   73 Text Interpretation: Sinus rhythm Left ventricular hypertrophy Borderline prolonged QT interval No significant change since last tracing Confirmed by Calvert Cantor [phone]) on 01/28/2020 7:09:07 PM   Radiology No results found.  Procedures Procedures  Medications Ordered in the ED Medications - No data to display   MDM Rules/Calculators/A&P MDM Patient with fall after dialysis. No acute complaints but reports a headache which has apparently been going on for sever weeks. Unclear if he injured this during his fall today. Will send for CT imaging. No labs indicated at this time.  ED Course  I have reviewed the triage vital signs and the nursing notes.  Pertinent labs & imaging results that were available during my care of the patient were reviewed by me and considered in my medical decision making (see chart for details).  Clinical Course as of Jan 28 2351  Fri Jan 28, 2020  2048 CT images reviewed with Radiologist who recommends MRI to assess for residual infection.    [CS]  2114 Spoke with Daughter and updated her on plan.    [CS]  2308 CBC is at baseline.    [CS]  2327 BMP consistent with known ESRD. No significant electrolyte abnormalities.    [CS]  2351 Care of the patient signed out to Dr. Roxanne Mins at the change of shift.    [CS]    Clinical Course User Index [CS] Truddie Hidden, MD    Final Clinical Impression(s) / ED Diagnoses Final diagnoses:  None    Rx /  DC Orders ED Discharge Orders    None         Truddie Hidden, MD 01/28/20 2352

## 2020-01-28 NOTE — ED Triage Notes (Signed)
BIB [facility] after pt fell after leaving dialysis. EMS reports patient left prior to finishing dialysis session and as they were walking out, fell and hit left rear of head. EMS reports pt not on blood thinners, no LOC., and no back/neck pain. Pt does have staples in back of head r/t to recent surgery.

## 2020-01-29 LAB — SEDIMENTATION RATE: Sed Rate: 90 mm/hr — ABNORMAL HIGH (ref 0–16)

## 2020-01-29 MED ORDER — VANCOMYCIN HCL IN DEXTROSE 750-5 MG/150ML-% IV SOLN
750.0000 mg | INTRAVENOUS | 0 refills | Status: DC
Start: 2020-01-31 — End: 2020-01-29

## 2020-01-29 MED ORDER — RIFAMPIN 300 MG PO CAPS: 300.0000 mg | ORAL_CAPSULE | Freq: Two times a day (BID) | ORAL | 0 refills | Status: DC

## 2020-01-29 MED ORDER — RIFAMPIN 300 MG PO CAPS
300.0000 mg | ORAL_CAPSULE | Freq: Once | ORAL | Status: AC
  Administered 2020-01-29: 300 mg via ORAL
  Filled 2020-01-29: qty 1

## 2020-01-29 NOTE — Discharge Instructions (Addendum)
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 It is vitally important that you take the antibiotic capsule every day, twice a day. If you don't the infection could get worse and leave you permanently paralyzed! You also need to contniue taking the antibiotic infusion after every dialysis, for the same reason.  It is very important that you wear the neck brace at all times, except when bathing. You need to continue wearing it until the neurosurgeon tells you to stop.

## 2020-01-29 NOTE — ED Notes (Signed)
Called [facility] 

## 2020-01-29 NOTE — ED Provider Notes (Signed)
Care assumed from Dr. 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 Case was signed out to me pending MRI scan. MRI of cervical spine shows marrow edema and erosive changes involving atlantooccipital and C1-2 articulations as well as persistent circumferential epidural abscess. Also, edema and phlegmonous change involving prevertebral soft tissues as well as other significant findings concerning for infection. Case was discussed with Ms. Reinaldo Meeker of neurosurgery service who has reviewed the MRI scans along with supervising physician, Dr. Annette Stable, who feels that findings are acceptable for this stage postoperatively. Sedimentation rate is noted to be elevated but decreasing. Neurosurgery recommends that patient wear stiff collar at all times except when bathing, and he is placed in an Aspen collar. Per medication reconciliation, and states that he is not taking rifampin as he is supposed to. He is given a new prescription for rifampin and need for daily use was stressed, including risk of quadriplegia if he does not. He has an appointment with infectious disease scheduled for 9/2 and he is to keep that appointment. He also needs to have a follow-up appointment with neurosurgery.  Results for orders placed or performed during the hospital encounter of 48/18/56  Basic metabolic panel  Result Value Ref Range   Sodium 135 135 - 145 mmol/L   Potassium 3.8 3.5 - 5.1 mmol/L   Chloride 96 (L) 98 - 111 mmol/L   CO2 28 22 - 32 mmol/L   Glucose, Bld 93 70 - 99 mg/dL   BUN 26 (H) 8 - 23 mg/dL   Creatinine, Ser 7.94 (H) 0.61 - 1.24 mg/dL   Calcium 7.6 (L) 8.9 - 10.3 mg/dL   GFR calc non Af Amer 6 (L) >60 mL/min   GFR calc Af Amer 7 (L) >60 mL/min   Anion gap 11 5 - 15  CBC with Differential  Result Value Ref Range   WBC 6.2 4.0 - 10.5 K/uL   RBC 2.66 (L) 4.22 - 5.81 MIL/uL   Hemoglobin 7.2 (L) 13.0 - 17.0 g/dL   HCT 22.4 (L) 39 - 52 %    MCV 84.2 80.0 - 100.0 fL   MCH 27.1 26.0 - 34.0 pg   MCHC 32.1 30.0 - 36.0 g/dL   RDW 16.8 (H) 11.5 - 15.5 %   Platelets 257 150 - 400 K/uL   nRBC 0.0 0.0 - 0.2 %   Neutrophils Relative % 70 %   Neutro Abs 4.4 1.7 - 7.7 K/uL   Lymphocytes Relative 18 %   Lymphs Abs 1.1 0.7 - 4.0 K/uL   Monocytes Relative 8 %   Monocytes Absolute 0.5 0 - 1 K/uL   Eosinophils Relative 3 %   Eosinophils Absolute 0.2 0 - 0 K/uL   Basophils Relative 1 %   Basophils Absolute 0.0 0 - 0 K/uL   Immature Granulocytes 0 %   Abs Immature Granulocytes 0.02 0.00 - 0.07 K/uL  Sedimentation rate  Result Value Ref Range   Sed Rate 90 (H) 0 - 16 mm/hr   DG Chest 2 View  Result Date: 01/04/2020 CLINICAL DATA:  Back pain, weakness and shortness of breath EXAM: CHEST - 2 VIEW COMPARISON:  Radiograph 12/30/2019 FINDINGS: Tunneled dual lumen dialysis catheter tip terminates at the superior cavoatrial junction, similar position to prior. Cardiomediastinal contours are unremarkable. Telemetry leads overlie the chest. Some hazy interstitial opacities are present throughout the lungs with central vascular congestion and cephalization. No visible pneumothorax or effusion. No  acute osseous or soft tissue abnormality. IMPRESSION: Hazy opacities likely reflect edema/volume overload given vascular congestion and redistribution though should correlate with clinical setting as atypical infection could present similarly. Electronically Signed   By: Lovena Le M.D.   On: 01/04/2020 15:02   DG Chest 2 View  Result Date: 12/30/2019 CLINICAL DATA:  Chest pain. EXAM: CHEST - 2 VIEW COMPARISON:  02/11/2019 FINDINGS: Right-sided dialysis catheter tip in the lower SVC. Upper normal heart size with improved cardiomegaly from prior exam. Minor basilar atelectasis. No confluent airspace disease. No pulmonary edema. No pleural effusion or pneumothorax. No acute osseous abnormalities are seen. IMPRESSION: Minor basilar atelectasis. Electronically  Signed   By: Keith Rake M.D.   On: 12/30/2019 22:27   DG Cervical Spine 2-3 Views  Result Date: 01/06/2020 CLINICAL DATA:  Cervicothoracic laminectomy infusion EXAM: CERVICAL SPINE - 2-3 VIEW; DG C-ARM 1-60 MIN COMPARISON:  None. FLUOROSCOPY TIME:  Fluoroscopy Time:  10.8 seconds Radiation Exposure Index (if provided by the fluoroscopic device): 2.51 mGy Number of Acquired Spot Images: 2 FINDINGS: Posterior fusion at C4-T1 is noted. The inferior aspect is incompletely evaluated on the lateral film. IMPRESSION: Cervicothoracic fusion. Electronically Signed   By: Inez Catalina M.D.   On: 01/06/2020 16:46   CT Head Wo Contrast  Result Date: 01/28/2020 CLINICAL DATA:  [place] after dialysis hit back of head EXAM: CT HEAD WITHOUT CONTRAST CT CERVICAL SPINE WITHOUT CONTRAST TECHNIQUE: Multidetector CT imaging of the head and cervical spine was performed following the standard protocol without intravenous contrast. Multiplanar CT image reconstructions of the cervical spine were also generated. COMPARISON:  Radiograph 01/06/2020, MRI 01/04/2020, CT brain and MRI brain 12/31/2019, CT brain 01/11/2020 FINDINGS: CT HEAD FINDINGS Brain: No acute territorial infarction, hemorrhage, or intracranial mass is visualized. Mild atrophy. Moderate hypodensity in the white matter consistent with chronic small vessel ischemic change. 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 Stable ventricle size Vascular: No hyperdense vessels.  Carotid vascular calcification. Skull: No fracture Sinuses/Orbits: No acute finding. Other: None CT CERVICAL SPINE FINDINGS Alignment: Reversal of cervical lordosis trace retrolisthesis C4 on C5. Facet alignment is maintained. Skull base and vertebrae: New erosive changes at the anterior arch of C1 and the dens of C2. There are also new erosive changes and sclerosis at the left atlanto occipital articulation. Soft tissues and spinal canal: Marked prevertebral soft tissue swelling with  possible prevertebral fluid anterior to C2, series 5, image 38, though difficult to further characterize without contrast. Abnormal epidural density posterior to C2. Disc levels: Patient is status post posterior rods C4 through T1 with transpedicular screws at C4, C5, C6 and T1. Advanced degenerative changes at C6-C7 and C7-T1 with mild degenerative changes elsewhere in the cervical spine. Upper chest: Negative. Other: None IMPRESSION: 1. No CT evidence for acute intracranial abnormality. Atrophy and chronic small vessel ischemic change of the white matter. 2. New erosive changes at the anterior arch of C1 and the dens of C2, concerning for osteomyelitis. Erosive change also present at the left atlantooccipital articulation, concerning for infection. Marked prevertebral soft tissue swelling with possible prevertebral fluid collection/abscess though further characterisation limited without contrast. Abnormal epidural density at C2, probably representing residual epidural abscess. MRI evaluation is recommended. 3. Posterior rods C4 through T1. Advanced degenerative changes at C6-C7 and C7-T1. Critical Value/emergent results were called by telephone at the time of interpretation on 01/28/2020 at 8:21 pm to provider [facility] , who verbally acknowledged these results. Electronically Signed   By:  Donavan Foil M.D.   On: 01/28/2020 20:21   CT HEAD WO CONTRAST  Result Date: 01/11/2020 CLINICAL DATA:  69 year old male with altered mental status. Cervical spine epidural abscess, postop day 5. Intubated. EXAM: CT HEAD WITHOUT CONTRAST TECHNIQUE: Contiguous axial images were obtained from the base of the skull through the vertex without intravenous contrast. COMPARISON:  Brain MRI 12/31/2019.  Cervical spine MRI 01/04/2020. FINDINGS: Brain: Abnormal appearance of the visible upper cervical spinal canal on series 3, image 1, with epidural fluid or soft tissue, probably not significantly changed from the abnormal  cervical MRI 01/04/2020 (please see that report) No ventriculomegaly. No intracranial mass effect. Basilar cisterns remain patent. 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 Elsewhere cerebral gray-white matter differentiation remains within normal limits. No cortically based acute infarct identified. No acute intracranial hemorrhage identified. Vascular: No suspicious intracranial vascular hyperdensity. Skull: The skull appears stable and intact. Sinuses/Orbits: Minimal fluid in a posterior right ethmoid air cell. Other Visualized paranasal sinuses and mastoids are stable and well pneumatized. Other: Partially visible midline suboccipital postoperative changes to the scalp (series 4, image 1). No other acute orbit or scalp soft tissue finding. IMPRESSION: 1. No acute intracranial abnormality. Stable thalamic small vessel disease. 2. Continued abnormal upper cervical epidural space. Partially visible cervical posterior midline postoperative changes. Electronically Signed   By: Genevie Ann M.D.   On: 01/11/2020 01:21   CT Head Wo Contrast  Result Date: 12/31/2019 CLINICAL DATA:  Headache. EXAM: CT HEAD WITHOUT CONTRAST TECHNIQUE: Contiguous axial images were obtained from the base of the skull through the vertex without intravenous contrast. COMPARISON:  None. FINDINGS: Brain: Mild chronic ischemic white matter disease is noted. No mass effect or midline shift is noted. Ventricular size is within normal limits. There is no evidence of mass lesion, hemorrhage or acute infarction. Vascular: No hyperdense vessel or unexpected calcification. Skull: Normal. Negative for fracture or focal lesion. Sinuses/Orbits: No acute finding. Other: None. IMPRESSION: Mild chronic ischemic white matter disease. No acute intracranial abnormality seen. Electronically Signed   By: Marijo Conception M.D.   On: 12/31/2019 09:19   CT Cervical Spine Wo Contrast  Result Date: 01/28/2020 CLINICAL DATA:  [place] after  dialysis hit back of head EXAM: CT HEAD WITHOUT CONTRAST CT CERVICAL SPINE WITHOUT CONTRAST TECHNIQUE: Multidetector CT imaging of the head and cervical spine was performed following the standard protocol without intravenous contrast. Multiplanar CT image reconstructions of the cervical spine were also generated. COMPARISON:  Radiograph 01/06/2020, MRI 01/04/2020, CT brain and MRI brain 12/31/2019, CT brain 01/11/2020 FINDINGS: CT HEAD FINDINGS Brain: No acute territorial infarction, hemorrhage, or intracranial mass is visualized. Mild atrophy. Moderate hypodensity in the white matter consistent with chronic small vessel ischemic change. 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 Stable ventricle size Vascular: No hyperdense vessels.  Carotid vascular calcification. Skull: No fracture Sinuses/Orbits: No acute finding. Other: None CT CERVICAL SPINE FINDINGS Alignment: Reversal of cervical lordosis trace retrolisthesis C4 on C5. Facet alignment is maintained. Skull base and vertebrae: New erosive changes at the anterior arch of C1 and the dens of C2. There are also new erosive changes and sclerosis at the left atlanto occipital articulation. Soft tissues and spinal canal: Marked prevertebral soft tissue swelling with possible prevertebral fluid anterior to C2, series 5, image 38, though difficult to further characterize without contrast. Abnormal epidural density posterior to C2. Disc levels: Patient is status post posterior rods C4 through T1 with transpedicular screws  at C4, C5, C6 and T1. Advanced degenerative changes at C6-C7 and C7-T1 with mild degenerative changes elsewhere in the cervical spine. Upper chest: Negative. Other: None IMPRESSION: 1. No CT evidence for acute intracranial abnormality. Atrophy and chronic small vessel ischemic change of the white matter. 2. New erosive changes at the anterior arch of C1 and the dens of C2, concerning for osteomyelitis. Erosive change also present at the  left atlantooccipital articulation, concerning for infection. Marked prevertebral soft tissue swelling with possible prevertebral fluid collection/abscess though further characterisation limited without contrast. Abnormal epidural density at C2, probably representing residual epidural abscess. MRI evaluation is recommended. 3. Posterior rods C4 through T1. Advanced degenerative changes at C6-C7 and C7-T1. Critical Value/emergent results were called by telephone at the time of interpretation on 01/28/2020 at 8:21 pm to provider [facility] , who verbally acknowledged these results. Electronically Signed   By: Donavan Foil M.D.   On: 01/28/2020 20:21   MR BRAIN WO CONTRAST  Result Date: 01/11/2020 CLINICAL DATA:  Mental status change EXAM: MRI HEAD WITHOUT CONTRAST TECHNIQUE: Multiplanar, multiecho pulse sequences of the brain and surrounding structures were obtained without intravenous contrast. COMPARISON:  12/31/2019 FINDINGS: Brain: There is no acute infarction or intracranial hemorrhage. There is no intracranial mass, mass effect, or edema. There is no hydrocephalus or extra-axial fluid collection. Prominence of the ventricles and sulci reflects mild generalized parenchymal volume loss. Patchy and confluent areas of T2 hyperintensity in the supratentorial white matter are nonspecific but may reflect moderate chronic microvascular ischemic changes. There are chronic small vessel infarcts of the thalamus and right pons. Several small foci of susceptibility hypointensity are present likely reflecting chronic microhemorrhages also present on the prior study. Vascular: Major vessel flow voids at the skull base are preserved. Skull and upper cervical spine: Normal marrow signal is preserved. Sinuses/Orbits: Paranasal sinuses are aerated. Orbits are unremarkable. Other: Sella is unremarkable.  Mastoid air cells are clear. IMPRESSION: No evidence of recent infarction, hemorrhage, mass. Stable chronic findings  detailed above. Electronically Signed   By: Macy Mis M.D.   On: 01/11/2020 11:56   MR BRAIN WO CONTRAST  Result Date: 12/31/2019 CLINICAL DATA:  Headache.  Difficulty walking.  Regional pain. EXAM: MRI HEAD WITHOUT CONTRAST TECHNIQUE: Multiplanar, multiecho pulse sequences of the brain and surrounding structures were obtained without intravenous contrast. COMPARISON:  Head CT same day FINDINGS: Brain: Diffusion imaging does not show any acute or subacute infarction. Old small vessel infarction in the right para median pons. No focal cerebellar finding. Cerebral hemispheres show an old lacunar infarction in the medial thalamus bilaterally. There are moderate chronic small-vessel ischemic changes of the cerebral hemispheric white matter. No cortical or large vessel territory infarction. No mass lesion, hemorrhage, hydrocephalus or extra-axial collection. No lesion seen of the perimesencephalic cisterns. Vascular: Major vessels at the base of the brain show flow. Skull and upper cervical spine: Negative Sinuses/Orbits: Clear/normal Other: None IMPRESSION: No acute, subacute or reversible finding. 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 Electronically Signed   By: Nelson Chimes M.D.   On: 12/31/2019 12:27   MR CERVICAL SPINE WO CONTRAST  Result Date: 01/29/2020 CLINICAL DATA:  Initial evaluation for epidural abscess. EXAM: MRI CERVICAL SPINE WITHOUT CONTRAST TECHNIQUE: Multiplanar, multisequence MR imaging of the cervical spine was performed. No intravenous contrast was administered. COMPARISON:  Prior CT from 01/28/2020 as well as prior MRI from 01/04/2020. FINDINGS: Alignment: Examination moderately degraded by motion artifact. Straightening with smooth  reversal of the normal cervical lordosis. Trace retrolisthesis of C4 on C5, with trace anterolisthesis of C5 on C6. Vertebrae: Postoperative changes from recent C4 through T2 laminectomy with C4 through T1  posterior arthrodesis for debridement of previously identified epidural abscess. Vertebral body height maintained without interval fracture. Abnormal marrow edema with erosive changes seen involving the left greater than right atlantooccipital articulations, concerning for osteomyelitis and septic arthritis. Progressive erosive changes about the dens and anterior arch of C1, also consistent with infection. Changes are new/progressed as compared to prior MRI. Surrounding soft tissue swelling and edema extending from the skull base to approximately C6, consistent with associated phlegmon. Probable small collections seen involving the prevertebral soft tissues at the level of C2, largest of which measures 10 mm on the left (series 1033, image 46). Circumferential epidural collection extending from the foramen magnum to approximately C3-4 consistent with persistent epidural abscess (series 10, image 30 on sagittal view, series 1033, image 44 on axial view). Resultant moderate diffuse spinal stenosis at these levels. No frank cord impingement for compression. Distally, there is increased fluid signal intensity seen within the C5-6, C6-7, and C7-T1 interspace is, new from prior, and suspicious for associated discitis (series 7, image 8). No significant erosion or edema within the adjacent endplates. Cord: No definite cord signal abnormality to suggest myelopathy or edema on this motion degraded exam. Posterior Fossa, vertebral arteries, paraspinal tissues: Remote right pontine lacunar infarct noted. Associated age-related cerebral atrophy. Extensive edema throughout the posterior paraspinous soft tissues extending from the skull base to approximately T2, which could be related to postoperative changes and/or soft tissue infection. Small collection measuring approximately 1.6 x 1.3 x 7.6 cm seen along the midline incision (series 9, image 40). Prevertebral swelling and phlegmon with superimposed small prevertebral  collections as above. Normal flow voids preserved within the vertebral arteries bilaterally. Disc levels: C1-2: Circumferential epidural abscess with effacement of the thecal sac. Resultant moderate to severe spinal stenosis without frank cord compression (series 1033, image 44). C2-C3: No significant disc bulge. Circumferential epidural abscess with resultant moderate spinal stenosis. C3-C4: Diffuse disc bulge with bilateral uncovertebral hypertrophy. Resultant mild-to-moderate spinal stenosis. Moderate right with mild left C4 foraminal narrowing. C4-C5: Small central disc protrusion indents the ventral thecal sac (series 1033, image 68). Prior posterior decompression and fusion. No significant spinal stenosis or cord deformity. Foramina appear grossly patent. C5-C6: Mild diffuse disc bulge with uncovertebral hypertrophy. Prior posterior decompression with fusion. No residual spinal stenosis. Foramina appear grossly patent. C6-C7: Degenerative intervertebral disc space narrowing with diffuse disc osteophyte, slightly asymmetric to the left. Disc osteophyte indents and partially effaces the left ventral thecal sac. Prior posterior decompression and fusion. No significant spinal stenosis. Severe left C7 foraminal narrowing. No significant right foraminal stenosis. C7-T1: Mild disc bulge with uncovertebral hypertrophy. Prior posterior decompression with fusion. No residual spinal stenosis. Foramina appear grossly patent. IMPRESSION: 1. Postoperative changes from recent C4-T2 laminectomy with C4-T1 posterior arthrodesis for debridement of previously identified epidural abscess. 2. Abnormal marrow edema with erosive changes involving the atlantooccipital and C1-2 articulations, consistent with osteomyelitis and septic arthritis. 3. Persistent circumferential epidural abscess extending from the foramen magnum to approximately C3-4, with resultant moderate to severe spinal stenosis. No frank cord compression or cord  signal changes. 4. Extensive edema and phlegmonous change involving the prevertebral soft tissues extending from the clivus to approximately C6, consistent with infection. Superimposed small prevertebral collections measuring up to 10 mm at the level of C2 as above. Edema involving the  posterior paraspinous soft tissues could reflect postoperative change and/or infection. Superimposed 1.6 x 1.3 x 7.6 cm collection along the posterior midline incision. 5. Increased fluid signal intensity within the C5-6, C6-7, and C7-T1 interspace, nonspecific, but could reflect associated discitis. Electronically Signed   By: Jeannine Boga M.D.   On: 01/29/2020 01:28   MR CERVICAL SPINE WO CONTRAST  Result Date: 01/04/2020 CLINICAL DATA:  Severe diffuse back pain. Study was ordered without and contrast. None contrast imaging the cervical spine was completed. Patient refused further imaging due to pain. Study is moderately degraded by patient motion. EXAM: MRI CERVICAL SPINE WITHOUT CONTRAST TECHNIQUE: Multiplanar, multisequence MR imaging of the cervical spine was performed. No intravenous contrast was administered. COMPARISON:  MR head without contrast 12/31/2019. FINDINGS: Alignment: Slight retrolisthesis is present at C5-6. Degenerative anterolisthesis is present T1-2. Reversal of the normal cervical lordosis is noted. Vertebrae: Decreased T1 marrow signal is present in the lower cervical spine, particularly C7 and T1. Vertebral body heights are maintained. Cord: T2 signal hyperintensity is present in the spinal cord from C3-4 through C5. Posterior Fossa, vertebral arteries, paraspinal tissues: Visualized brainstem and cerebellum are normal. Diffuse edema is present in the occipital paraspinous musculature bilaterally. No discrete abscess is present. Extensive prevertebral edema extends from the clivus to C6. No abscess is present. Mild edema is present in the posterior paraspinous muscles, left greater than right. Disc  levels: a abnormal fluid present in the dorsal spinal canal extending from foramen magnum to the T2 level. Severe disc disease present at C5-6 and C6-7. The cord is compressed at these levels. Mild disc disease present at C3-4 and C4-5 with some cord compression at these levels. IMPRESSION: 1. Abnormal fluid in the dorsal spinal canal extending from foramen magnum to the T2 level. Given the other inflammatory changes and concern, this most likely represents epidural infection. There is some fluid ventral to the spinal cord at C2. 2. Diffuse prevertebral edema from the clivus to C6 is also concerning for infection. 3. The cord is compressed without abnormal signal suggesting edema. 4. Mild edema in the posterior paraspinous muscles, left greater than right. Edema is most significant within the paraspinous muscles just below the occiput. No focal abscess is present. 5. Severe disc disease at C5-6 and C6-7 with moderate central canal stenosis at both levels. 6. Mild disc disease at C3-4 and C4-5 with some cord compression at these levels. 7. Abnormal marrow signal in the lower cervical spine, particularly C7 and T1. This is nonspecific and can be seen in the setting of anemia, smoking, or obesity. Electronically Signed   By: San Morelle M.D.   On: 01/04/2020 18:51   MR THORACIC SPINE WO CONTRAST  Result Date: 01/05/2020 CLINICAL DATA:  Epidural fluid collection.  Back pain and neck pain. EXAM: MRI THORACIC SPINE WITHOUT CONTRAST TECHNIQUE: Multiplanar, multisequence MR imaging of the thoracic spine was performed. No intravenous contrast was administered. COMPARISON:  None. FINDINGS: Alignment:  Physiologic. Vertebrae: No fracture, evidence of discitis, or bone lesion. Cord: Dorsal epidural fluid collection extending from the cervical spine to T2 level is again seen. There is no continuation of the collection below the T2 level. Spinal cord signal is normal. Paraspinal and other soft tissues: Negative. Disc  levels: At T2-3, there is a small right subarticular disc protrusion. There is no other thoracic disc herniation. No spinal canal or neural foraminal stenosis. IMPRESSION: 1. Dorsal epidural fluid collection extending from the cervical spine to T2 level, consistent with epidural abscess.  No continuation below the T2 level. 2. No thoracic spinal canal or neural foraminal stenosis. Electronically Signed   By: Ulyses Jarred M.D.   On: 01/05/2020 19:42   MR LUMBAR SPINE WO CONTRAST  Result Date: 01/06/2020 CLINICAL DATA:  In stage renal disease patient on hemodialysis. Worsening headache and back pain. Assess for evidence of infection. Cervicothoracic dorsal epidural fluid collection. EXAM: MRI LUMBAR SPINE WITHOUT CONTRAST TECHNIQUE: Multiplanar, multisequence MR imaging of the lumbar spine was performed. No intravenous contrast was administered. COMPARISON:  Cervical study done 2 days ago. Thoracic study done yesterday. FINDINGS: Segmentation:  5 lumbar type vertebral bodies assumed. Alignment:  2 mm degenerative anterolisthesis L2-3, L3-4 and L4-5. Vertebrae: No fracture or primary bone lesion. No finding to suggest discitis or osteomyelitis. See below regarding facet arthropathy. Conus medullaris and cauda equina: Conus extends to the T12-L1 level. There is what appears to be a subdural fluid collection at the posterior left quadrant extending from T12-L1 to L2-3, with maximal size on the axial images 7 x 11 mm in cross-sectional diameter. This causes mass effect upon the neural structures, crowding them anteriorly. Additionally, there is probably some epidural material posteriorly at L3 to L4. There may be some subdural component of this as well. Paraspinal and other soft tissues: Nonspecific edematous change of the posterior para spinous musculature, right worse than left. See below. Disc levels: L1-2: No significant disc pathology. No canal stenosis. See above regarding subdural collection. L2-3: Facet  arthropathy with 2 mm of anterolisthesis. Bulging of the disc. Mild multifactorial spinal stenosis. Some facet edema on both sides. This does not appear pronounced, but facet joint infection is not excluded. L3-4: Facet arthropathy right more than left with 2 mm of anterolisthesis. Bulging of the disc. Moderate to severe multifactorial spinal stenosis that could cause neural compression. Facet arthropathy may simply be degenerative, but facet infection is not excluded, particularly on the right. L4-5: Facet arthropathy with 2 mm of anterolisthesis. Bulging of the disc. Severe multifactorial spinal stenosis that could cause neural compression. Facet edema right more than left that could be degenerative or potentially relate to infection. L5-S1: Mild bulging of the disc. Mild facet osteoarthritis. No apparent compressive stenosis. IMPRESSION: 1. There is what appears to be a subdural fluid collection at the posterior left quadrant extending from T12-L1 to L2-3, with maximal diameter on the axial images 7 x 11 mm in cross-sectional diameter. This causes mass effect upon the neural structures, crowding them anteriorly. Additionally, there is probably some epidural material posteriorly at L3 to L4. This could be a small subdural component of this as well. This could be infected material. 2. 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 There is 2 mm of anterolisthesis at L2-3, L3-4 and L4-5. There is disc degeneration with bulging of the discs. There is moderate to severe multifactorial spinal stenosis at L3-4 and severe spinal stenosis at L4-5 that could cause neural compression. Facet joint edema at L2-3, L3-4 and L4-5 could be degenerative or potentially relate to infection. The single most suspicious looking facet joint is on the right at L3-4. Additionally, the posterior para spinous soft tissue structures show edematous change which could be inflammatory. Electronically  Signed   By: Nelson Chimes M.D.   On: 01/06/2020 11:25   US AORTA  Result Date: 01/09/2020 CLINICAL DATA:  Pulsatile abdominal mass. Evaluate for possible abdominal aortic aneurysm. EXAM: ULTRASOUND OF ABDOMINAL AORTA TECHNIQUE: Ultrasound examination of the abdominal aorta and proximal common iliac  arteries was performed to evaluate for aneurysm. Additional color and Doppler images of the distal aorta were obtained to document patency. COMPARISON:  CT abdomen and pelvis 03/29/2012 FINDINGS: Abdominal aortic measurements as follows: Proximal:  2.6 x 2.6 cm Mid:  2.2 x 2.6 cm Distal:  1.9 x 1.8 cm Patent: Yes, peak systolic velocity is 824.2 cm/s Right common iliac artery: 1.3 x 1.5 cm Left common iliac artery: 1.2 x 1.2 cm Study quality is degraded by patient motion and overlying bowel gas. IMPRESSION: 1. No evidence for abdominal aortic aneurysm. 2. Ectatic abdominal aorta at risk for aneurysm development. Recommend followup by ultrasound in 5 years. This recommendation follows ACR consensus guidelines: White Paper of the ACR Incidental Findings Committee II on Vascular Findings. J Am Coll. Electronically Signed   By: Nolon Nations M.D.   On: 01/09/2020 09:15   IR Fluoro Guide CV Line Right  Result Date: 01/21/2020 INDICATION: 69 year old male with end-stage renal disease on hemodialysis via a left upper extremity brachiocephalic arteriovenous fistula. He recently had an extravasation event with hematoma formation making it difficult to access the left upper extremity fistula. He currently has a temporary IJ hemodialysis catheter on the right but needs a more durable access in till his fistula can again be used. EXAM: TUNNELED CENTRAL VENOUS HEMODIALYSIS CATHETER PLACEMENT WITH ULTRASOUND AND FLUOROSCOPIC GUIDANCE MEDICATIONS: 2 g Ancef. The antibiotic was given in an appropriate time interval prior to skin puncture. ANESTHESIA/SEDATION: None. FLUOROSCOPY TIME:  Fluoroscopy Time: 1 minutes 18 seconds (13  mGy). COMPLICATIONS: None immediate. PROCEDURE: Informed written consent was obtained from the patient after a discussion of the risks, benefits, and alternatives to treatment. Questions regarding the procedure were encouraged and answered. The right neck and chest were prepped with chlorhexidine in a sterile fashion, and a sterile drape was applied covering the operative field. Maximum barrier sterile technique with sterile gowns and gloves were used for the procedure. A timeout was performed prior to the initiation of the procedure. After creating a small venotomy incision, a micropuncture kit was utilized to access the right internal jugular vein under direct, real-time ultrasound guidance after the overlying soft tissues were anesthetized with 1% lidocaine with epinephrine. Ultrasound image documentation was performed. The microwire was kinked to measure appropriate catheter length. A stiff Glidewire was advanced to the level of the IVC and the micropuncture sheath was exchanged for a peel-away sheath. A palindrome tunneled hemodialysis catheter measuring 23 cm from tip to cuff was tunneled in a retrograde fashion from the anterior chest wall to the venotomy incision. The catheter was then placed through the peel-away sheath with tips ultimately positioned within the superior aspect of the right atrium. Final catheter positioning was confirmed and documented with a spot radiographic image. The catheter aspirates and flushes normally. The catheter was flushed with appropriate volume heparin dwells. The catheter exit site was secured with a 0-Prolene retention suture. The venotomy incision was closed with Dermabond. Dressings were applied. The patient tolerated the procedure well without immediate post procedural complication. IMPRESSION: Successful placement of 23 cm tip to cuff tunneled hemodialysis catheter via the right internal jugular vein with tips terminating within the superior aspect of the right atrium.  The catheter is ready for immediate use. The previously placed temporary right IJ catheter was removed. Electronically Signed   By: Jacqulynn Cadet M.D.   On: 01/21/2020 11:58   IR Removal Tun Cv Cath W/O FL  Result Date: 01/10/2020 INDICATION: Functioning fistula now. EXAM: REMOVAL TUNNELED CENTRAL VENOUS  CATHETER MEDICATIONS: 1% lidocaine 10 mL COMPLICATIONS: None immediate PROCEDURE: Informed written consent was obtained from the patient after a thorough discussion of the procedural risks, benefits and alternatives. All questions were addressed. Maximal Sterile Barrier Technique was utilized including caps, mask, sterile gowns, sterile gloves, sterile drape, hand hygiene and skin antiseptic. A timeout was performed prior to the initiation of the procedure. The patient's right chest and catheter was prepped and draped in a normal sterile fashion. Heparin was removed from both ports of catheter. 1% lidocaine was used for local anesthesia. Using gentle blunt dissection the cuff of the catheter was exposed and the catheter was removed in it's entirety. Pressure was held till hemostasis was obtained. A sterile dressing was applied. The patient tolerated the procedure well with no immediate complications. IMPRESSION: Successful catheter removal as described above. Read by: Soyla Dryer, NP Electronically Signed   By: Lucrezia Europe M.D.   On: 01/07/2020 17:03   IR US Guide Vasc Access Left  Result Date: 01/21/2020 INDICATION: 69 year old male with end-stage renal disease on hemodialysis via a left upper extremity brachiocephalic arteriovenous fistula. He recently had an extravasation event with hematoma formation making it difficult to access the left upper extremity fistula. He currently has a temporary IJ hemodialysis catheter on the right but needs a more durable access in till his fistula can again be used. EXAM: TUNNELED CENTRAL VENOUS HEMODIALYSIS CATHETER PLACEMENT WITH ULTRASOUND AND FLUOROSCOPIC  GUIDANCE MEDICATIONS: 2 g Ancef. The antibiotic was given in an appropriate time interval prior to skin puncture. ANESTHESIA/SEDATION: None. FLUOROSCOPY TIME:  Fluoroscopy Time: 1 minutes 18 seconds (13 mGy). COMPLICATIONS: None immediate. PROCEDURE: Informed written consent was obtained from the patient after a discussion of the risks, benefits, and alternatives to treatment. Questions regarding the procedure were encouraged and answered. The right neck and chest were prepped with chlorhexidine in a sterile fashion, and a sterile drape was applied covering the operative field. Maximum barrier sterile technique with sterile gowns and gloves were used for the procedure. A timeout was performed prior to the initiation of the procedure. After creating a small venotomy incision, a micropuncture kit was utilized to access the right internal jugular vein under direct, real-time ultrasound guidance after the overlying soft tissues were anesthetized with 1% lidocaine with epinephrine. Ultrasound image documentation was performed. The microwire was kinked to measure appropriate catheter length. A stiff Glidewire was advanced to the level of the IVC and the micropuncture sheath was exchanged for a peel-away sheath. A palindrome tunneled hemodialysis catheter measuring 23 cm from tip to cuff was tunneled in a retrograde fashion from the anterior chest wall to the venotomy incision. The catheter was then placed through the peel-away sheath with tips ultimately positioned within the superior aspect of the right atrium. Final catheter positioning was confirmed and documented with a spot radiographic image. The catheter aspirates and flushes normally. The catheter was flushed with appropriate volume heparin dwells. The catheter exit site was secured with a 0-Prolene retention suture. The venotomy incision was closed with Dermabond. Dressings were applied. The patient tolerated the procedure well without immediate post procedural  complication. IMPRESSION: Successful placement of 23 cm tip to cuff tunneled hemodialysis catheter via the right internal jugular vein with tips terminating within the superior aspect of the right atrium. The catheter is ready for immediate use. The previously placed temporary right IJ catheter was removed. Electronically Signed   By: Jacqulynn Cadet M.D.   On: 01/21/2020 11:58   IR US Guide Vasc Access  Right  Result Date: 01/21/2020 INDICATION: 69 year old male with end-stage renal disease. He was dialyzing via a left upper extremity arteriovenous fistula but suffered in extravasation event resulting in hematoma formation in the arm and is now dialyzing via a temporary IJ catheter on the right. EXAM: Fistulagram MEDICATIONS: None. ANESTHESIA/SEDATION: None FLUOROSCOPY TIME:  Fluoroscopy Time: 1 minutes 18 seconds (13 mGy). COMPLICATIONS: None immediate. PROCEDURE: Informed written consent was obtained from the patient after a thorough discussion of the procedural risks, benefits and alternatives. All questions were addressed. Maximal Sterile Barrier Technique was utilized including caps, mask, sterile gowns, sterile gloves, sterile drape, hand hygiene and skin antiseptic. A timeout was performed prior to the initiation of the procedure. The left upper extremity AV fistula was interrogated with ultrasound and found to be widely patent. An image was obtained and stored for the medical record. Local anesthesia was attained by infiltration with 1% lidocaine. A small dermatotomy was made. Under real-time sonographic guidance, the vessel was punctured with a 21 gauge micropuncture needle. Using standard technique, the initial micro needle was exchanged over a 0.018 micro wire for a transitional 4 [place] micro sheath. A diagnostic fistulagram was then performed. This is a left upper extremity brachiocephalic arteriovenous fistula. There are multiple side branches with some cross-filling between the cephalic vein and  the deeper brachial basilic system. Mild tortuosity present at the cephalic arch. However, there is not appear to be a hemodynamically significant stenosis or evidence of clot. The central veins are widely patent. The arterial anastomosis also appears widely patent. Normal palpable thrill. Sonographic evaluation of the upper extremity does demonstrate a moderately large subcutaneous hematoma overlying a portion of the access zone. IMPRESSION: 1. Left upper extremity brachiocephalic arteriovenous fistula without evidence of significant stenosis or occlusion. 2. There are multiple small side branches but these do not appear to limit flow or function. 3. Superficial subcutaneous hematoma in the upper arm overlying a portion of the access zone may result in temporarily difficult cannulation. ACCESS: This access remains amenable to future percutaneous interventions as clinically indicated. Electronically Signed   By: Jacqulynn Cadet M.D.   On: 01/21/2020 13:52   DG Chest Port 1 View  Result Date: 01/12/2020 CLINICAL DATA:  Respiratory failure. EXAM: PORTABLE CHEST 1 VIEW COMPARISON:  The chest x-ray 01/11/2020 FINDINGS: The heart size is normal. Endotracheal tube terminates 5.5 cm above the carina. Enteric tube courses off the inferior border the film. Right IJ line is stable. Left lower lobe airspace opacity is stable. No new airspace disease is present. IMPRESSION: 1. Stable left lower lobe airspace disease. 2. Support apparatus is stable. Electronically Signed   By: San Morelle M.D.   On: 01/12/2020 05:49   DG CHEST PORT 1 VIEW  Result Date: 01/11/2020 CLINICAL DATA:  Respiratory distress. EXAM: PORTABLE CHEST 1 VIEW COMPARISON:  Earlier film, same date. FINDINGS: The endotracheal tube, NG tube and right IJ catheters are stable. The cardiac silhouette, mediastinal and hilar contours are within normal limits and stable. The lungs are clear. IMPRESSION: 1. Stable support apparatus. 2. No acute  cardiopulmonary findings. Electronically Signed   By: Marijo Sanes M.D.   On: 01/11/2020 05:45   DG CHEST PORT 1 VIEW  Result Date: 01/11/2020 CLINICAL DATA:  69 year old male intubated, enteric tube placement. Recent posterior C4-T1 fusion. EXAM: PORTABLE CHEST 1 VIEW COMPARISON:  Portable chest 01/10/2020 and earlier. FINDINGS: Portable AP semi upright view at 0024 hours. Stable right IJ central line. Endotracheal tube tip projects about 8 mm  above the carina. Enteric tube courses to the abdomen and into the stomach. Improved lung volumes. Mediastinal contours remain within normal limits. Allowing for portable technique the lungs are clear. Negative visible bowel gas pattern. Posterior cervicothoracic spinal fusion hardware with skin staples. IMPRESSION: 1. Endotracheal tube tip approximately 8 mm above the carina. Enteric tube courses to the stomach. 2.  No acute cardiopulmonary abnormality. Electronically Signed   By: Genevie Ann M.D.   On: 01/11/2020 00:56   DG Chest Port 1 View  Result Date: 01/10/2020 CLINICAL DATA:  69 year old male status post central line placement. EXAM: PORTABLE CHEST 1 VIEW COMPARISON:  Radiograph dated 01/04/2020 FINDINGS: Right IJ central venous line with tip over upper SVC. No pneumothorax. There is no focal consolidation, or pleural effusion. Top-normal cardiac size. No acute osseous pathology. Partially visualized lower cervical fusion. IMPRESSION: Right IJ central venous line with tip over upper SVC. No pneumothorax. Electronically Signed   By: Anner Crete M.D.   On: 01/10/2020 18:45   DG Abd Portable 1V  Result Date: 01/11/2020 CLINICAL DATA:  69 year old male intubated, enteric tube placement. EXAM: PORTABLE ABDOMEN - 1 VIEW COMPARISON:  Abdominal radiograph 09/05/2012. FINDINGS: Portable AP supine view at 0032 hours. Enteric tube has been placed into the stomach with side hole at the level of the gastric antrum. Tube tip may be within the proximal duodenum. Non  obstructed bowel gas pattern. No acute osseous abnormality identified. IMPRESSION: Enteric tube placed into the stomach, tube tip may be within the proximal duodenum. Electronically Signed   By: Genevie Ann M.D.   On: 01/11/2020 00:54   DG C-Arm 1-60 Min  Result Date: 01/06/2020 CLINICAL DATA:  Cervicothoracic laminectomy infusion EXAM: CERVICAL SPINE - 2-3 VIEW; DG C-ARM 1-60 MIN COMPARISON:  None. FLUOROSCOPY TIME:  Fluoroscopy Time:  10.8 seconds Radiation Exposure Index (if provided by the fluoroscopic device): 2.51 mGy Number of Acquired Spot Images: 2 FINDINGS: Posterior fusion at C4-T1 is noted. The inferior aspect is incompletely evaluated on the lateral film. IMPRESSION: Cervicothoracic fusion. Electronically Signed   By: Inez Catalina M.D.   On: 01/06/2020 16:46   IR DIALY SHUNT INTRO NEEDLE/INTRACATH INITIAL W/IMG LEFT  Result Date: 01/21/2020 INDICATION: 69 year old male with end-stage renal disease. He was dialyzing via a left upper extremity arteriovenous fistula but suffered in extravasation event resulting in hematoma formation in the arm and is now dialyzing via a temporary IJ catheter on the right. EXAM: Fistulagram MEDICATIONS: None. ANESTHESIA/SEDATION: None FLUOROSCOPY TIME:  Fluoroscopy Time: 1 minutes 18 seconds (13 mGy). COMPLICATIONS: None immediate. PROCEDURE: Informed written consent was obtained from the patient after a thorough discussion of the procedural risks, benefits and alternatives. All questions were addressed. Maximal Sterile Barrier Technique was utilized including caps, mask, sterile gowns, sterile gloves, sterile drape, hand hygiene and skin antiseptic. A timeout was performed prior to the initiation of the procedure. The left upper extremity AV fistula was interrogated with ultrasound and found to be widely patent. An image was obtained and stored for the medical record. Local anesthesia was attained by infiltration with 1% lidocaine. A small dermatotomy was made. Under  real-time sonographic guidance, the vessel was punctured with a 21 gauge micropuncture needle. Using standard technique, the initial micro needle was exchanged over a 0.018 micro wire for a transitional 4 [place] micro sheath. A diagnostic fistulagram was then performed. This is a left upper extremity brachiocephalic arteriovenous fistula. There are multiple side branches with some cross-filling between the cephalic vein and the deeper brachial  basilic system. Mild tortuosity present at the cephalic arch. However, there is not appear to be a hemodynamically significant stenosis or evidence of clot. The central veins are widely patent. The arterial anastomosis also appears widely patent. Normal palpable thrill. Sonographic evaluation of the upper extremity does demonstrate a moderately large subcutaneous hematoma overlying a portion of the access zone. IMPRESSION: 1. Left upper extremity brachiocephalic arteriovenous fistula without evidence of significant stenosis or occlusion. 2. There are multiple small side branches but these do not appear to limit flow or function. 3. Superficial subcutaneous hematoma in the upper arm overlying a portion of the access zone may result in temporarily difficult cannulation. ACCESS: This access remains amenable to future percutaneous interventions as clinically indicated. Electronically Signed   By: Jacqulynn Cadet M.D.   On: 01/21/2020 13:52   ECHOCARDIOGRAM COMPLETE  Result Date: 01/07/2020    ECHOCARDIOGRAM REPORT   Patient Name:   Mainor Hellmann. Date of Exam: 01/07/2020 Medical Rec #:  [id number]          Height:       71.5 in Accession #:    [id number]         Weight:       211.4 lb Date of Birth:  1950-12-22         BSA:          2.170 m Patient Age:    2 years           BP:           162/70 mmHg Patient Gender: M                  HR:           67 bpm. Exam Location:  Inpatient Procedure: 2D Echo, Cardiac Doppler and Color Doppler Indications:    Bacteremia.  History:         Patient has no prior history of Echocardiogram examinations.                 Signs/Symptoms:Murmur; Risk Factors:Current Smoker and Diabetes.                 MRSA. Cancer. ESRD.  Sonographer:    Roseanna Rainbow RDCS Referring Phys: [id number] [facility] AMIN  Sonographer Comments: Technically difficult study due to poor echo windows. Patient appears confused, had trouble stayiong still during exam. Cervical brace prevented optimal positioning and windows. IMPRESSIONS  1. Left ventricular ejection fraction, by estimation, is >75%. The left ventricle has hyperdynamic function. The left ventricle has no regional wall motion abnormalities. There is mild left ventricular hypertrophy. Left ventricular diastolic parameters are consistent with Grade II diastolic dysfunction (pseudonormalization). Elevated left atrial pressure.  2. Right ventricular systolic function is normal. The right ventricular size is normal.  3. The mitral valve is normal in structure. Trivial mitral valve regurgitation. No evidence of mitral stenosis.  4. The aortic valve is tricuspid. Aortic valve regurgitation is not visualized. Mild aortic valve sclerosis is present, with no evidence of aortic valve stenosis.  5. The inferior vena cava is normal in size with greater than 50% respiratory variability, suggesting right atrial pressure of 3 mmHg. FINDINGS  Left Ventricle: Left ventricular ejection fraction, by estimation, is >75%. The left ventricle has hyperdynamic function. The left ventricle has no regional wall motion abnormalities. The left ventricular internal cavity size was normal in size. There is mild left ventricular hypertrophy. Left ventricular diastolic parameters are consistent with Grade II diastolic dysfunction (pseudonormalization).  Elevated left atrial pressure. Right Ventricle: The right ventricular size is normal.Right ventricular systolic function is normal. Left Atrium: Left atrial size was normal in size. Right Atrium: Right  atrial size was normal in size. Pericardium: There is no evidence of pericardial effusion. Mitral Valve: The mitral valve is normal in structure. Normal mobility of the mitral valve leaflets. Trivial mitral valve regurgitation. No evidence of mitral valve stenosis. Tricuspid Valve: The tricuspid valve is normal in structure. Tricuspid valve regurgitation is trivial. No evidence of tricuspid stenosis. Aortic Valve: The aortic valve is tricuspid. Aortic valve regurgitation is not visualized. Mild aortic valve sclerosis is present, with no evidence of aortic valve stenosis. Pulmonic Valve: The pulmonic valve was not well visualized. Pulmonic valve regurgitation is not visualized. No evidence of pulmonic stenosis. Aorta: The aortic root is normal in size and structure. Venous: The inferior vena cava is normal in size with greater than 50% respiratory variability, suggesting right atrial pressure of 3 mmHg. IAS/Shunts: No atrial level shunt detected by color flow Doppler.  LEFT VENTRICLE PLAX 2D LVIDd:         5.10 cm     Diastology LVIDs:         3.10 cm     LV e' lateral:   10.10 cm/s LV PW:         1.40 cm     LV E/e' lateral: 11.3 LV IVS:        1.00 cm     LV e' medial:    6.64 cm/s LVOT diam:     2.10 cm     LV E/e' medial:  17.2 LV SV:         105 LV SV Index:   48 LVOT Area:     3.46 cm  LV Volumes (MOD) LV vol d, MOD A2C: 71.2 ml LV vol d, MOD A4C: 90.8 ml LV vol s, MOD A2C: 23.8 ml LV vol s, MOD A4C: 22.6 ml LV SV MOD A2C:     47.4 ml LV SV MOD A4C:     90.8 ml LV SV MOD BP:      61.5 ml RIGHT VENTRICLE             IVC RV S prime:     17.00 cm/s  IVC diam: 1.50 cm TAPSE (M-mode): 3.1 cm LEFT ATRIUM           Index       RIGHT ATRIUM           Index LA diam:      3.00 cm 1.38 cm/m  RA Area:     12.30 cm LA Vol (A2C): 21.2 ml 9.77 ml/m  RA Volume:   23.40 ml  10.78 ml/m LA Vol (A4C): 47.8 ml 22.03 ml/m  AORTIC VALVE LVOT Vmax:   151.00 cm/s LVOT Vmean:  88.600 cm/s LVOT VTI:    0.302 m  AORTA Ao Root diam:  3.30 cm MITRAL VALVE MV Area (PHT): 3.06 cm     SHUNTS MV Decel Time: 248 msec     Systemic VTI:  0.30 m MV E velocity: 114.00 cm/s  Systemic Diam: 2.10 cm MV A velocity: 111.00 cm/s MV E/A ratio:  1.03 Kirk Ruths MD Electronically signed by Kirk Ruths MD Signature Date/Time: 01/07/2020/3:10:33 PM    Final       Delora Fuel, MD 93/71/69 [phone]

## 2020-01-29 NOTE — ED Notes (Signed)
Patient transported to to room 20

## 2020-02-02 LAB — CULTURE, BLOOD (ROUTINE X 2)
Culture: NO GROWTH
Culture: NO GROWTH
Special Requests: ADEQUATE
Special Requests: ADEQUATE

## 2020-02-03 ENCOUNTER — Inpatient Hospital Stay: Payer: No Typology Code available for payment source | Admitting: Infectious Diseases

## 2020-02-20 ENCOUNTER — Encounter (HOSPITAL_COMMUNITY): Payer: Self-pay | Admitting: Emergency Medicine

## 2020-02-20 ENCOUNTER — Other Ambulatory Visit: Payer: Self-pay

## 2020-02-20 ENCOUNTER — Emergency Department (HOSPITAL_COMMUNITY): Payer: No Typology Code available for payment source

## 2020-02-20 ENCOUNTER — Inpatient Hospital Stay (HOSPITAL_COMMUNITY)
Admission: EM | Admit: 2020-02-20 | Discharge: 2020-03-24 | DRG: 871 | Disposition: A | Discharge status: Hospice | Payer: No Typology Code available for payment source | Source: Skilled Nursing Facility | Attending: Internal Medicine | Admitting: Internal Medicine

## 2020-02-20 DIAGNOSIS — D638 Anemia in other chronic diseases classified elsewhere: Secondary | ICD-10-CM | POA: Diagnosis not present

## 2020-02-20 DIAGNOSIS — N186 End stage renal disease: Secondary | ICD-10-CM | POA: Diagnosis present

## 2020-02-20 DIAGNOSIS — I120 Hypertensive chronic kidney disease with stage 5 chronic kidney disease or end stage renal disease: Secondary | ICD-10-CM | POA: Diagnosis present

## 2020-02-20 DIAGNOSIS — G061 Intraspinal abscess and granuloma: Secondary | ICD-10-CM | POA: Diagnosis present

## 2020-02-20 DIAGNOSIS — A4189 Other specified sepsis: Secondary | ICD-10-CM | POA: Diagnosis present

## 2020-02-20 DIAGNOSIS — Z515 Encounter for palliative care: Secondary | ICD-10-CM | POA: Diagnosis not present

## 2020-02-20 DIAGNOSIS — A4101 Sepsis due to Methicillin susceptible Staphylococcus aureus: Secondary | ICD-10-CM | POA: Diagnosis not present

## 2020-02-20 DIAGNOSIS — E1169 Type 2 diabetes mellitus with other specified complication: Secondary | ICD-10-CM | POA: Diagnosis present

## 2020-02-20 DIAGNOSIS — D631 Anemia in chronic kidney disease: Secondary | ICD-10-CM | POA: Diagnosis present

## 2020-02-20 DIAGNOSIS — Z794 Long term (current) use of insulin: Secondary | ICD-10-CM

## 2020-02-20 DIAGNOSIS — U071 COVID-19: Secondary | ICD-10-CM | POA: Diagnosis present

## 2020-02-20 DIAGNOSIS — Z9115 Patient's noncompliance with renal dialysis: Secondary | ICD-10-CM

## 2020-02-20 DIAGNOSIS — R0603 Acute respiratory distress: Secondary | ICD-10-CM

## 2020-02-20 DIAGNOSIS — E875 Hyperkalemia: Secondary | ICD-10-CM | POA: Diagnosis present

## 2020-02-20 DIAGNOSIS — M898X9 Other specified disorders of bone, unspecified site: Secondary | ICD-10-CM | POA: Diagnosis present

## 2020-02-20 DIAGNOSIS — J155 Pneumonia due to Escherichia coli: Secondary | ICD-10-CM | POA: Diagnosis not present

## 2020-02-20 DIAGNOSIS — E871 Hypo-osmolality and hyponatremia: Secondary | ICD-10-CM | POA: Diagnosis present

## 2020-02-20 DIAGNOSIS — Z85038 Personal history of other malignant neoplasm of large intestine: Secondary | ICD-10-CM | POA: Diagnosis not present

## 2020-02-20 DIAGNOSIS — Z8249 Family history of ischemic heart disease and other diseases of the circulatory system: Secondary | ICD-10-CM

## 2020-02-20 DIAGNOSIS — D696 Thrombocytopenia, unspecified: Secondary | ICD-10-CM | POA: Diagnosis not present

## 2020-02-20 DIAGNOSIS — M1712 Unilateral primary osteoarthritis, left knee: Secondary | ICD-10-CM | POA: Diagnosis present

## 2020-02-20 DIAGNOSIS — Z7189 Other specified counseling: Secondary | ICD-10-CM | POA: Diagnosis not present

## 2020-02-20 DIAGNOSIS — D6959 Other secondary thrombocytopenia: Secondary | ICD-10-CM | POA: Diagnosis not present

## 2020-02-20 DIAGNOSIS — Y95 Nosocomial condition: Secondary | ICD-10-CM | POA: Diagnosis not present

## 2020-02-20 DIAGNOSIS — M4802 Spinal stenosis, cervical region: Secondary | ICD-10-CM | POA: Diagnosis present

## 2020-02-20 DIAGNOSIS — F05 Delirium due to known physiological condition: Secondary | ICD-10-CM | POA: Diagnosis not present

## 2020-02-20 DIAGNOSIS — J9600 Acute respiratory failure, unspecified whether with hypoxia or hypercapnia: Secondary | ICD-10-CM | POA: Diagnosis not present

## 2020-02-20 DIAGNOSIS — F17210 Nicotine dependence, cigarettes, uncomplicated: Secondary | ICD-10-CM | POA: Diagnosis present

## 2020-02-20 DIAGNOSIS — E1129 Type 2 diabetes mellitus with other diabetic kidney complication: Secondary | ICD-10-CM | POA: Diagnosis present

## 2020-02-20 DIAGNOSIS — B9620 Unspecified Escherichia coli [E. coli] as the cause of diseases classified elsewhere: Secondary | ICD-10-CM | POA: Diagnosis not present

## 2020-02-20 DIAGNOSIS — J9601 Acute respiratory failure with hypoxia: Secondary | ICD-10-CM | POA: Diagnosis present

## 2020-02-20 DIAGNOSIS — I10 Essential (primary) hypertension: Secondary | ICD-10-CM | POA: Diagnosis not present

## 2020-02-20 DIAGNOSIS — Z87898 Personal history of other specified conditions: Secondary | ICD-10-CM

## 2020-02-20 DIAGNOSIS — Z6825 Body mass index (BMI) 25.0-25.9, adult: Secondary | ICD-10-CM

## 2020-02-20 DIAGNOSIS — K7200 Acute and subacute hepatic failure without coma: Secondary | ICD-10-CM | POA: Diagnosis not present

## 2020-02-20 DIAGNOSIS — N2581 Secondary hyperparathyroidism of renal origin: Secondary | ICD-10-CM | POA: Diagnosis present

## 2020-02-20 DIAGNOSIS — Z992 Dependence on renal dialysis: Secondary | ICD-10-CM

## 2020-02-20 DIAGNOSIS — R0902 Hypoxemia: Secondary | ICD-10-CM

## 2020-02-20 DIAGNOSIS — R7401 Elevation of levels of liver transaminase levels: Secondary | ICD-10-CM

## 2020-02-20 DIAGNOSIS — Z0189 Encounter for other specified special examinations: Secondary | ICD-10-CM

## 2020-02-20 DIAGNOSIS — E874 Mixed disorder of acid-base balance: Secondary | ICD-10-CM | POA: Diagnosis not present

## 2020-02-20 DIAGNOSIS — R6521 Severe sepsis with septic shock: Secondary | ICD-10-CM | POA: Diagnosis not present

## 2020-02-20 DIAGNOSIS — Z66 Do not resuscitate: Secondary | ICD-10-CM | POA: Diagnosis not present

## 2020-02-20 DIAGNOSIS — J1282 Pneumonia due to coronavirus disease 2019: Secondary | ICD-10-CM | POA: Diagnosis present

## 2020-02-20 DIAGNOSIS — F0150 Vascular dementia without behavioral disturbance: Secondary | ICD-10-CM | POA: Diagnosis present

## 2020-02-20 DIAGNOSIS — E440 Moderate protein-calorie malnutrition: Secondary | ICD-10-CM | POA: Diagnosis not present

## 2020-02-20 DIAGNOSIS — M4622 Osteomyelitis of vertebra, cervical region: Secondary | ICD-10-CM | POA: Diagnosis present

## 2020-02-20 DIAGNOSIS — K7460 Unspecified cirrhosis of liver: Secondary | ICD-10-CM | POA: Diagnosis present

## 2020-02-20 DIAGNOSIS — Z981 Arthrodesis status: Secondary | ICD-10-CM

## 2020-02-20 DIAGNOSIS — Z833 Family history of diabetes mellitus: Secondary | ICD-10-CM

## 2020-02-20 DIAGNOSIS — G062 Extradural and subdural abscess, unspecified: Secondary | ICD-10-CM | POA: Diagnosis present

## 2020-02-20 DIAGNOSIS — R0602 Shortness of breath: Secondary | ICD-10-CM

## 2020-02-20 DIAGNOSIS — Z79899 Other long term (current) drug therapy: Secondary | ICD-10-CM

## 2020-02-20 DIAGNOSIS — G9341 Metabolic encephalopathy: Secondary | ICD-10-CM | POA: Diagnosis present

## 2020-02-20 DIAGNOSIS — Z452 Encounter for adjustment and management of vascular access device: Secondary | ICD-10-CM

## 2020-02-20 DIAGNOSIS — L89310 Pressure ulcer of right buttock, unstageable: Secondary | ICD-10-CM | POA: Diagnosis not present

## 2020-02-20 DIAGNOSIS — L89320 Pressure ulcer of left buttock, unstageable: Secondary | ICD-10-CM | POA: Diagnosis not present

## 2020-02-20 DIAGNOSIS — G4733 Obstructive sleep apnea (adult) (pediatric): Secondary | ICD-10-CM | POA: Diagnosis present

## 2020-02-20 DIAGNOSIS — E1122 Type 2 diabetes mellitus with diabetic chronic kidney disease: Secondary | ICD-10-CM | POA: Diagnosis present

## 2020-02-20 DIAGNOSIS — N19 Unspecified kidney failure: Secondary | ICD-10-CM

## 2020-02-20 DIAGNOSIS — Z781 Physical restraint status: Secondary | ICD-10-CM

## 2020-02-20 DIAGNOSIS — E162 Hypoglycemia, unspecified: Secondary | ICD-10-CM | POA: Diagnosis not present

## 2020-02-20 DIAGNOSIS — K219 Gastro-esophageal reflux disease without esophagitis: Secondary | ICD-10-CM | POA: Diagnosis present

## 2020-02-20 DIAGNOSIS — E1165 Type 2 diabetes mellitus with hyperglycemia: Secondary | ICD-10-CM | POA: Diagnosis not present

## 2020-02-20 DIAGNOSIS — R7989 Other specified abnormal findings of blood chemistry: Secondary | ICD-10-CM | POA: Diagnosis not present

## 2020-02-20 DIAGNOSIS — E11649 Type 2 diabetes mellitus with hypoglycemia without coma: Secondary | ICD-10-CM | POA: Diagnosis not present

## 2020-02-20 DIAGNOSIS — Z9049 Acquired absence of other specified parts of digestive tract: Secondary | ICD-10-CM

## 2020-02-20 DIAGNOSIS — D689 Coagulation defect, unspecified: Secondary | ICD-10-CM | POA: Diagnosis not present

## 2020-02-20 DIAGNOSIS — R627 Adult failure to thrive: Secondary | ICD-10-CM | POA: Diagnosis not present

## 2020-02-20 DIAGNOSIS — R1310 Dysphagia, unspecified: Secondary | ICD-10-CM | POA: Diagnosis not present

## 2020-02-20 DIAGNOSIS — A4151 Sepsis due to Escherichia coli [E. coli]: Secondary | ICD-10-CM | POA: Diagnosis not present

## 2020-02-20 DIAGNOSIS — J939 Pneumothorax, unspecified: Secondary | ICD-10-CM

## 2020-02-20 DIAGNOSIS — B1920 Unspecified viral hepatitis C without hepatic coma: Secondary | ICD-10-CM | POA: Diagnosis present

## 2020-02-20 DIAGNOSIS — B9562 Methicillin resistant Staphylococcus aureus infection as the cause of diseases classified elsewhere: Secondary | ICD-10-CM | POA: Diagnosis present

## 2020-02-20 DIAGNOSIS — E785 Hyperlipidemia, unspecified: Secondary | ICD-10-CM | POA: Diagnosis present

## 2020-02-20 LAB — COMPREHENSIVE METABOLIC PANEL
ALT: 19 U/L (ref 0–44)
AST: 34 U/L (ref 15–41)
Albumin: 2.5 g/dL — ABNORMAL LOW (ref 3.5–5.0)
Alkaline Phosphatase: 81 U/L (ref 38–126)
Anion gap: 17 — ABNORMAL HIGH (ref 5–15)
BUN: 82 mg/dL — ABNORMAL HIGH (ref 8–23)
CO2: 15 mmol/L — ABNORMAL LOW (ref 22–32)
Calcium: 7.9 mg/dL — ABNORMAL LOW (ref 8.9–10.3)
Chloride: 99 mmol/L (ref 98–111)
Creatinine, Ser: 13.66 mg/dL — ABNORMAL HIGH (ref 0.61–1.24)
GFR calc Af Amer: 4 mL/min — ABNORMAL LOW (ref >60)
GFR calc non Af Amer: 3 mL/min — ABNORMAL LOW (ref >60)
Glucose, Bld: 87 mg/dL (ref 70–99)
Potassium: 6.2 mmol/L — ABNORMAL HIGH (ref 3.5–5.1)
Sodium: 131 mmol/L — ABNORMAL LOW (ref 135–145)
Total Bilirubin: 0.8 mg/dL (ref 0.3–1.2)
Total Protein: 8 g/dL (ref 6.5–8.1)

## 2020-02-20 LAB — CBG MONITORING, ED
Glucose-Capillary: 149 mg/dL — ABNORMAL HIGH (ref 70–99)
Glucose-Capillary: 93 mg/dL (ref 70–99)
Glucose-Capillary: 63 mg/dL — ABNORMAL LOW (ref 70–99)
Glucose-Capillary: 77 mg/dL (ref 70–99)

## 2020-02-20 LAB — CBC WITH DIFFERENTIAL/PLATELET
Abs Immature Granulocytes: 0.01 10*3/uL (ref 0.00–0.07)
Basophils Absolute: 0 10*3/uL (ref 0.0–0.1)
Basophils Relative: 0 %
Eosinophils Absolute: 0 10*3/uL (ref 0.0–0.5)
Eosinophils Relative: 0 %
HCT: 26.7 % — ABNORMAL LOW (ref 39.0–52.0)
Hemoglobin: 8.5 g/dL — ABNORMAL LOW (ref 13.0–17.0)
Immature Granulocytes: 0 %
Lymphocytes Relative: 39 %
Lymphs Abs: 1.8 10*3/uL (ref 0.7–4.0)
MCH: 26 pg (ref 26.0–34.0)
MCHC: 31.8 g/dL (ref 30.0–36.0)
MCV: 81.7 fL (ref 80.0–100.0)
Monocytes Absolute: 0.4 10*3/uL (ref 0.1–1.0)
Monocytes Relative: 9 %
Neutro Abs: 2.4 10*3/uL (ref 1.7–7.7)
Neutrophils Relative %: 52 %
Platelets: 181 10*3/uL (ref 150–400)
RBC: 3.27 MIL/uL — ABNORMAL LOW (ref 4.22–5.81)
RDW: 17.8 % — ABNORMAL HIGH (ref 11.5–15.5)
WBC: 4.7 10*3/uL (ref 4.0–10.5)
nRBC: 0 % (ref 0.0–0.2)

## 2020-02-20 LAB — SARS CORONAVIRUS 2 BY RT PCR (HOSPITAL ORDER, PERFORMED IN ~~LOC~~ HOSPITAL LAB): SARS Coronavirus 2: POSITIVE — AB

## 2020-02-20 LAB — FIBRINOGEN: Fibrinogen: >800 mg/dL — ABNORMAL HIGH (ref 210–475)

## 2020-02-20 LAB — D-DIMER, QUANTITATIVE: D-Dimer, Quant: 2.06 ug/mL-FEU — ABNORMAL HIGH (ref 0.00–0.50)

## 2020-02-20 LAB — TRIGLYCERIDES: Triglycerides: 108 mg/dL (ref <150)

## 2020-02-20 LAB — LACTIC ACID, PLASMA: Lactic Acid, Venous: 1.1 mmol/L (ref 0.5–1.9)

## 2020-02-20 LAB — PROCALCITONIN: Procalcitonin: 0.64 ng/mL

## 2020-02-20 LAB — LACTATE DEHYDROGENASE: LDH: 241 U/L — ABNORMAL HIGH (ref 98–192)

## 2020-02-20 IMAGING — CT CT CERVICAL SPINE W/O CM
4 of 10 series · 10 of 33 positions shown, 11 images · non-contrast
Comparison: CT cervical spine, [DATE]

CLINICAL DATA: COVID positive, recent neck surgery

EXAM:
CT CERVICAL SPINE WITHOUT CONTRAST
TECHNIQUE: Multidetector CT imaging of the cervical spine was performed without
intravenous contrast. Multiplanar CT image reconstructions were also
generated.

[Series 5: head 3.0 mpr cor · coronal · 0.34mm/px · 1 of 72 slices shown]
[im 36/72  bone]
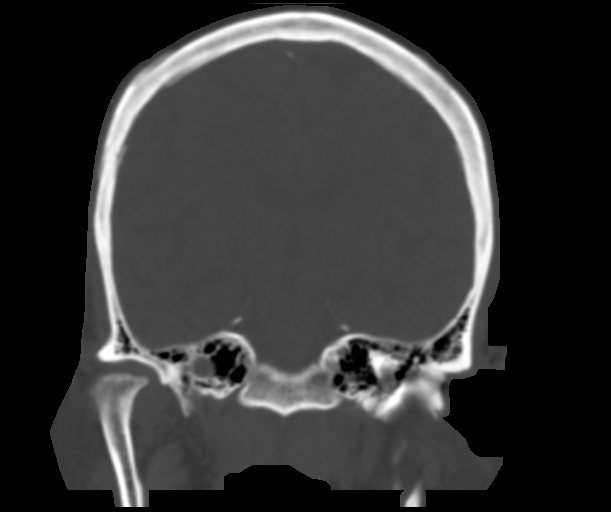

[Series 12: c_spine 2.0 st · axial · 0.40mm/px · z∈[-202,-76]mm · 4 of 106 slices shown, 5 images]
[im 22/106  soft-tissue]
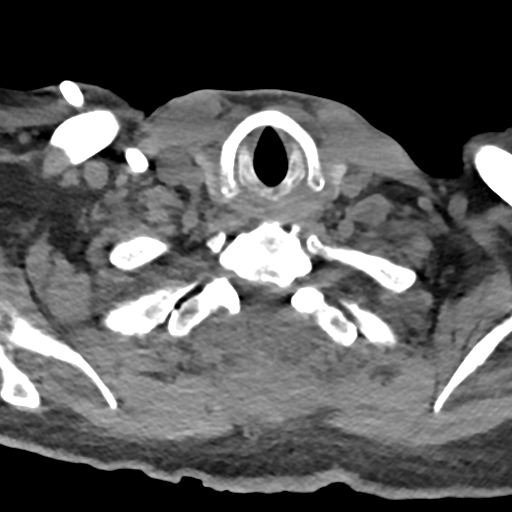
[im 22/106  bone]
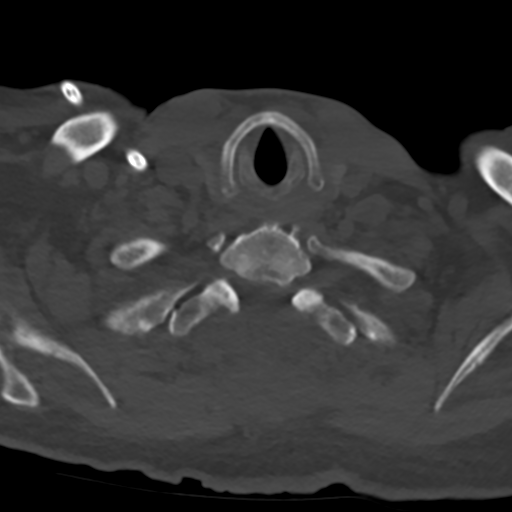
[im 43/106  bone]
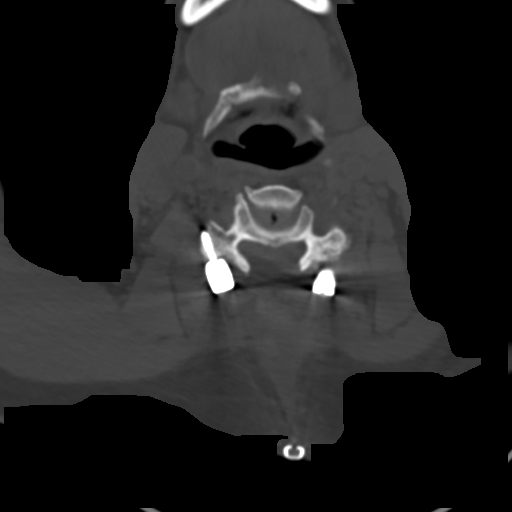
[im 64/106  bone]
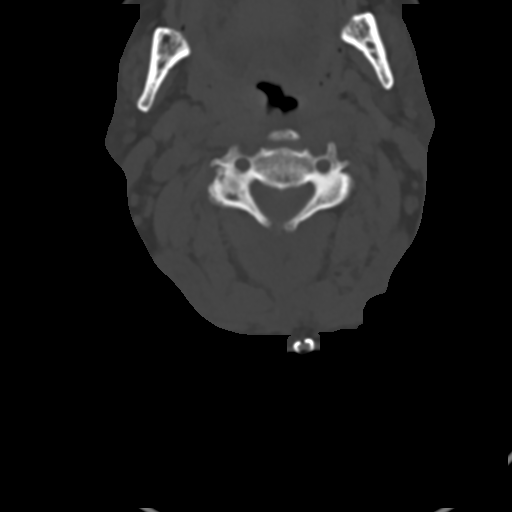
[im 85/106  bone]
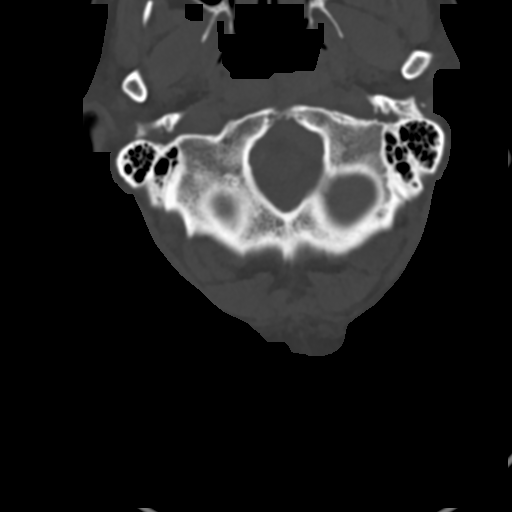

[Series 14: sagittal bone · sagittal · 0.31mm/px · 2 of 61 slices shown]
[im 21/61  bone]
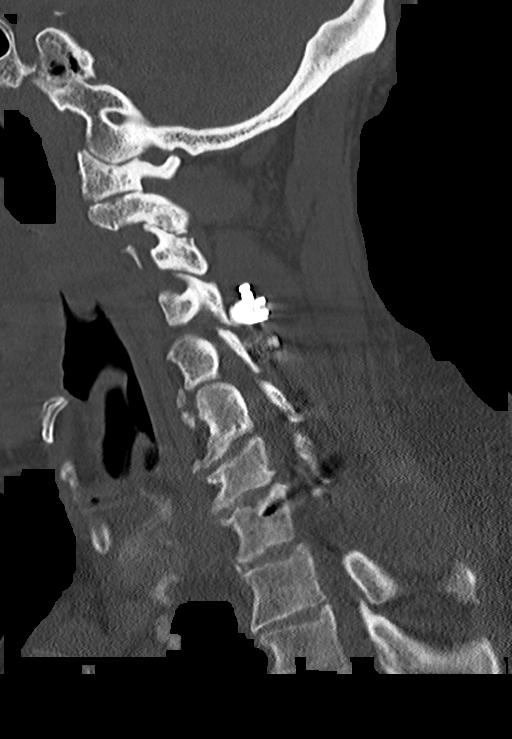
[im 41/61  bone]
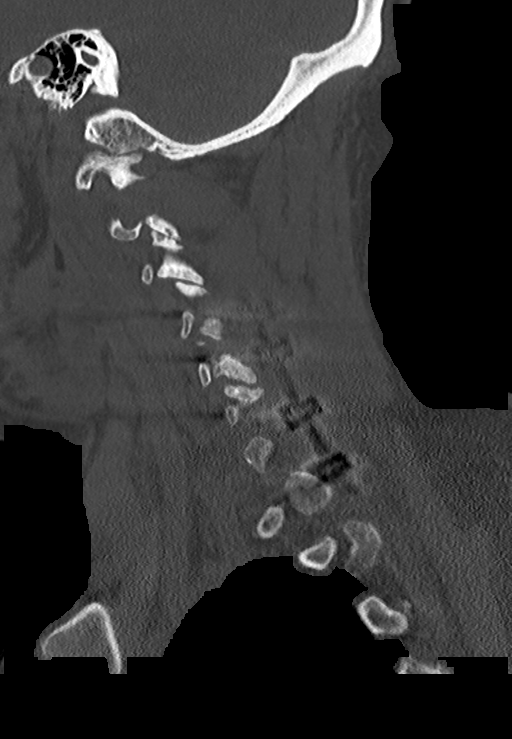

[Series 17: orthogonal axial st · axial · 0.21mm/px · z∈[-199,-112]mm · 3 of 92 slices shown]
[im 23/92  bone]
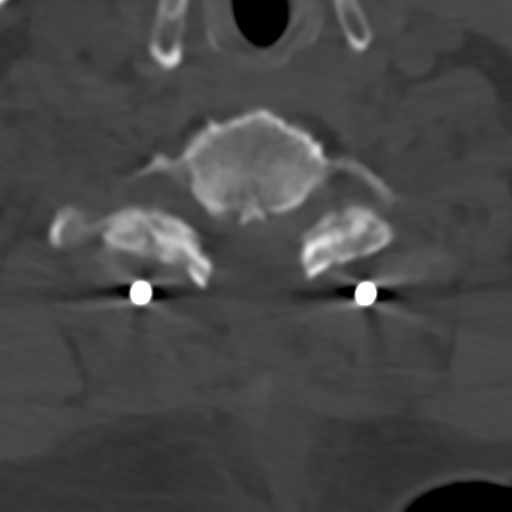
[im 46/92  bone]
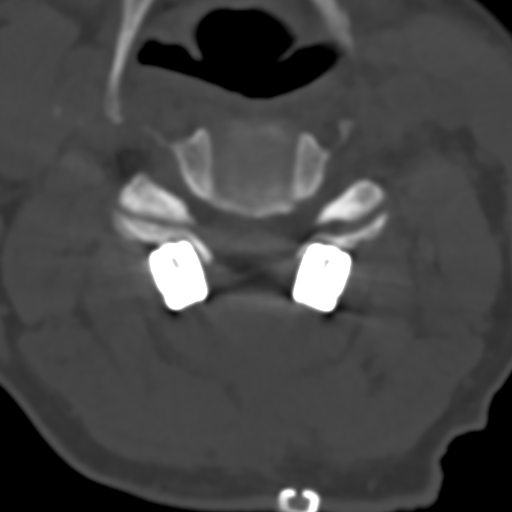
[im 69/92  bone]
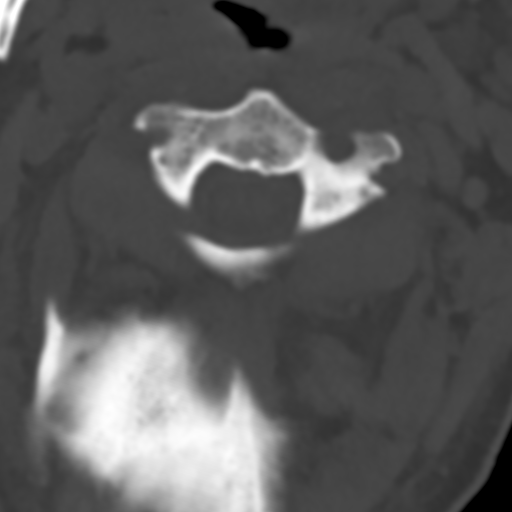

[10 of 33 positions shown; findings below may reference images not displayed]

FINDINGS: Alignment: Normal.

Skull base and vertebrae: No acute fracture. No primary bone lesion.
Erosive change of the dens and adjacent transverse process of C1 are
not significantly changed compared to prior examination.

Soft tissues and spinal canal: Assessment of the soft tissues is
somewhat limited by metallic streak artifact and lack of intravenous
contrast; within this limitation, no unexpected fluid collection
overlying the laminectomy site. Prevertebral soft tissue edema
appears reduced compared to prior examination. No visible canal
hematoma.

Disc levels: Status post posterior laminectomy and fusion of C4
through T1, with moderate disc space height loss and osteophytosis
of C6-C7 and C7-T1.

Upper chest: Negative.

Other: None.
IMPRESSION: 1. Redemonstrated postoperative findings status post posterior
laminectomy and fusion of C4 through T1, with moderate disc space
height loss and osteophytosis of C6-C7 and C7-T1.
2. Prevertebral soft tissue edema appears reduced compared to prior
examination.
3. Postoperative fluid collection overlying laminectomy site does
not appear significantly changed compared to prior examination.
4. Assessment of the soft tissues and epidural space is generally
limited on noncontrast CT and additionally limited by metallic
streak artifact; contrast enhanced MRI is the test of choice for
evaluation of edema, fluid collections, the epidural space, and
discitis osteomyelitis if clinically suspected in the postoperative
setting.

## 2020-02-20 IMAGING — DX DG CHEST 1V PORT
1 series · 1 of 1 positions shown · non-contrast
Comparison: [DATE]

CLINICAL DATA: COVID

EXAM:
PORTABLE CHEST 1 VIEW

[chest ap]
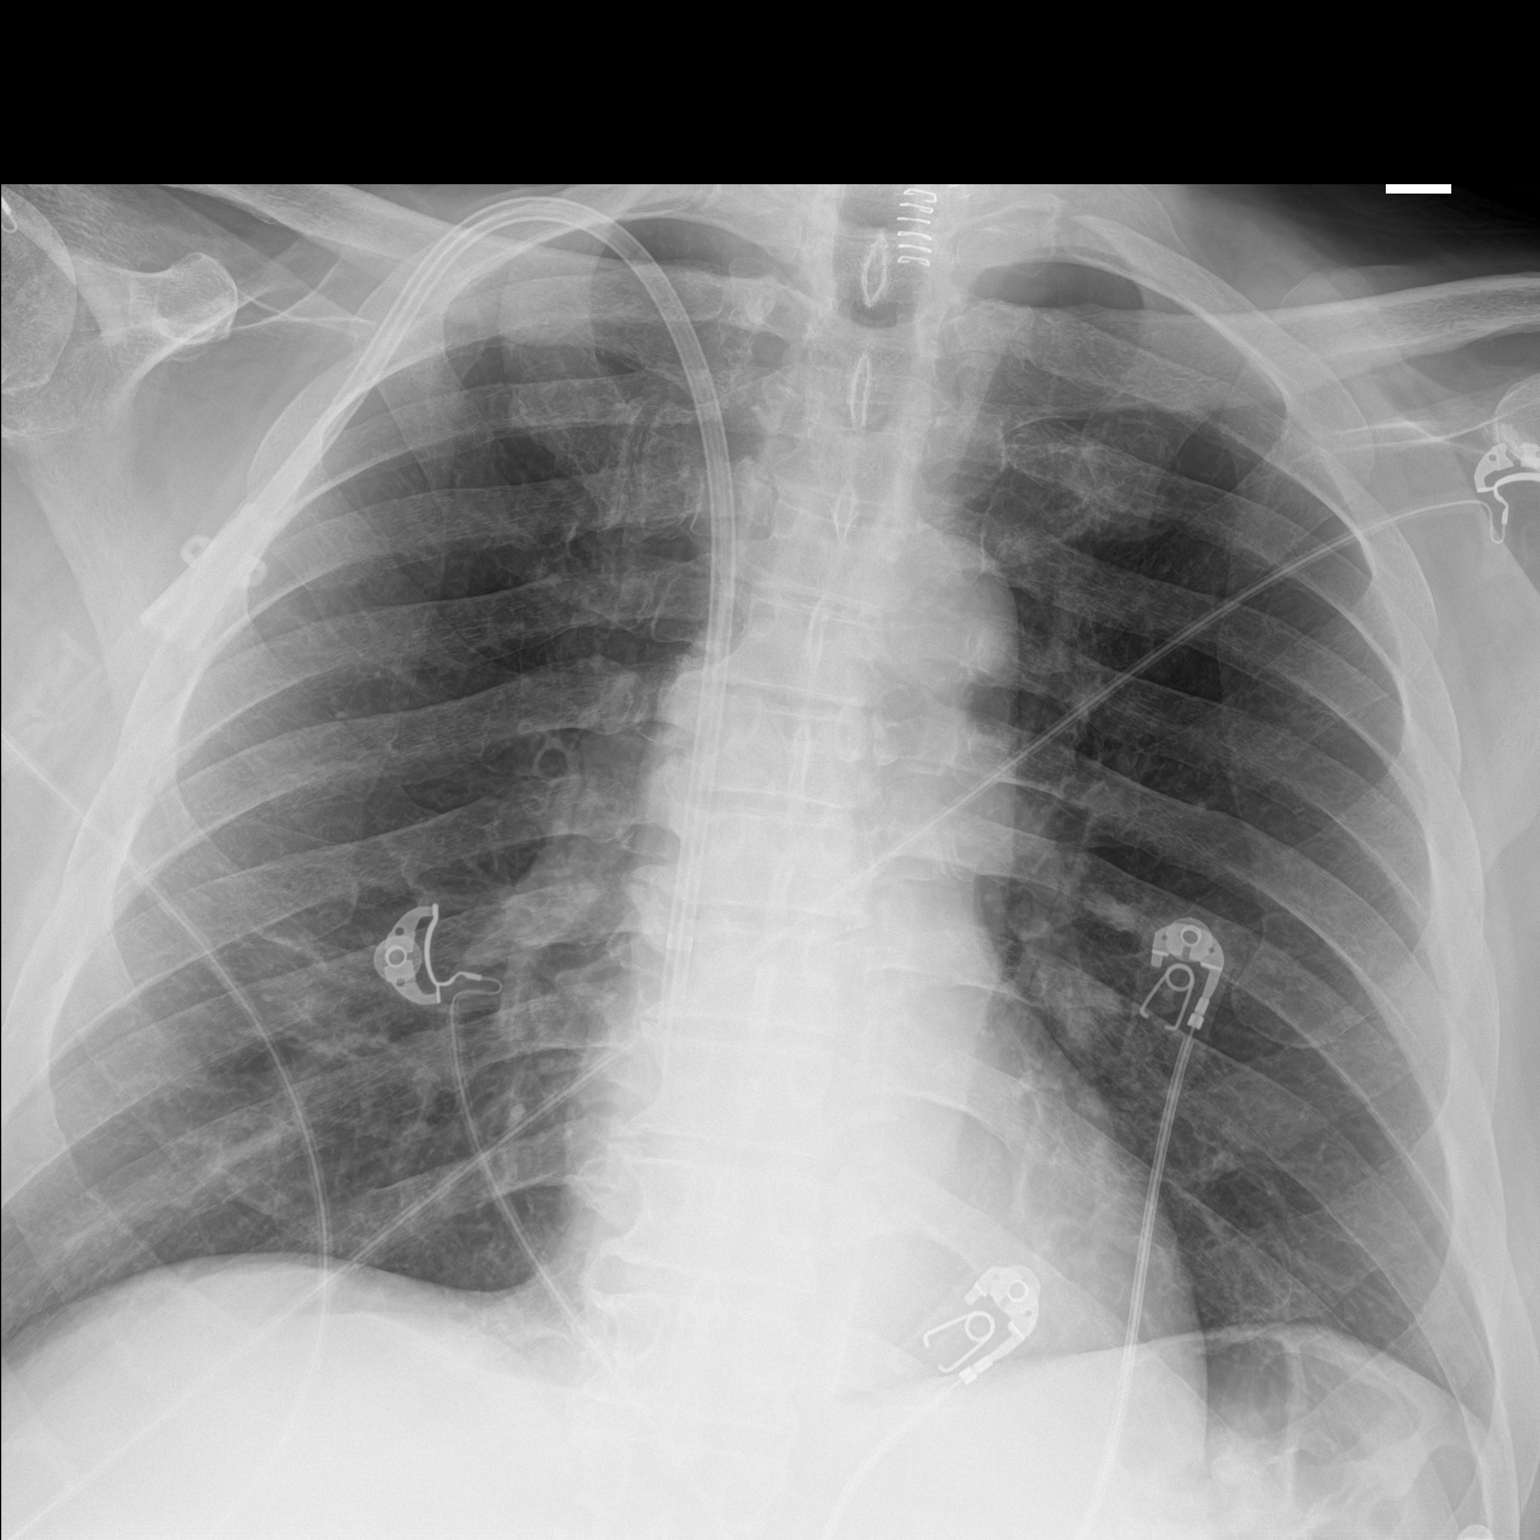

[1 of 1 positions shown; findings below may reference images not displayed]

FINDINGS: Interval extubation. Interval placement of large-bore right neck
vascular catheter, tip position near the superior cavoatrial
junction. The heart size and mediastinal contours are within normal
limits. Both lungs are clear. The visualized skeletal structures are
unremarkable.
IMPRESSION: 1. Interval placement of large-bore right neck vascular catheter,
tip position near the superior cavoatrial junction.
2. No acute abnormality of the lungs in AP portable projection.

## 2020-02-20 MED ORDER — VANCOMYCIN HCL IN DEXTROSE 1-5 GM/200ML-% IV SOLN
1000.0000 mg | INTRAVENOUS | Status: AC
  Administered 2020-02-21 – 2020-02-25 (×3): 1000 mg via INTRAVENOUS
  Filled 2020-02-20 (×5): qty 200

## 2020-02-20 MED ORDER — INSULIN ASPART 100 UNIT/ML ~~LOC~~ SOLN
0.0000 [IU] | Freq: Three times a day (TID) | SUBCUTANEOUS | Status: DC
  Administered 2020-02-21: 1 [IU] via SUBCUTANEOUS

## 2020-02-20 MED ORDER — INSULIN ASPART 100 UNIT/ML ~~LOC~~ SOLN: 0.0000 [IU] | Freq: Every day | SUBCUTANEOUS | Status: DC

## 2020-02-20 MED ORDER — INSULIN ASPART 100 UNIT/ML IV SOLN
5.0000 [IU] | Freq: Once | INTRAVENOUS | Status: AC
  Administered 2020-02-20: 5 [IU] via INTRAVENOUS

## 2020-02-20 MED ORDER — DEXTROSE 50 % IV SOLN
1.0000 | Freq: Once | INTRAVENOUS | Status: AC
  Administered 2020-02-20: 50 mL via INTRAVENOUS
  Filled 2020-02-20: qty 50

## 2020-02-20 MED ORDER — PROSOURCE TF PO LIQD
45.0000 mL | Freq: Two times a day (BID) | ORAL | Status: DC
  Administered 2020-02-20: 45 mL
  Filled 2020-02-20 (×3): qty 45

## 2020-02-20 MED ORDER — SODIUM ZIRCONIUM CYCLOSILICATE 10 G PO PACK
10.0000 g | PACK | Freq: Once | ORAL | Status: AC
  Administered 2020-02-20: 10 g via ORAL
  Filled 2020-02-20: qty 1

## 2020-02-20 MED ORDER — ASCORBIC ACID 500 MG PO TABS: 500.0000 mg | ORAL_TABLET | Freq: Every day | ORAL | Status: DC

## 2020-02-20 MED ORDER — GUAIFENESIN-DM 100-10 MG/5ML PO SYRP
10.0000 mL | ORAL_SOLUTION | ORAL | Status: DC | PRN
  Filled 2020-02-20: qty 10

## 2020-02-20 MED ORDER — ACETAMINOPHEN 650 MG RE SUPP: 650.0000 mg | Freq: Four times a day (QID) | RECTAL | Status: DC | PRN

## 2020-02-20 MED ORDER — SEVELAMER CARBONATE 800 MG PO TABS
1600.0000 mg | ORAL_TABLET | Freq: Three times a day (TID) | ORAL | Status: DC
  Administered 2020-02-20 – 2020-02-24 (×5): 1600 mg via ORAL
  Filled 2020-02-20 (×10): qty 2

## 2020-02-20 MED ORDER — CALCITRIOL 0.25 MCG PO CAPS
1.5000 ug | ORAL_CAPSULE | ORAL | Status: DC
  Administered 2020-02-21: 1.5 ug via ORAL
  Filled 2020-02-20: qty 6

## 2020-02-20 MED ORDER — HYDROCOD POLST-CPM POLST ER 10-8 MG/5ML PO SUER: 5.0000 mL | Freq: Two times a day (BID) | ORAL | Status: DC | PRN

## 2020-02-20 MED ORDER — CHLORHEXIDINE GLUCONATE CLOTH 2 % EX PADS
6.0000 | MEDICATED_PAD | Freq: Every day | CUTANEOUS | Status: DC
  Administered 2020-02-21 – 2020-03-02 (×9): 6 via TOPICAL

## 2020-02-20 MED ORDER — SODIUM CHLORIDE 0.9 % IV SOLN
200.0000 mg | Freq: Once | INTRAVENOUS | Status: AC
  Administered 2020-02-20: 200 mg via INTRAVENOUS
  Filled 2020-02-20: qty 40

## 2020-02-20 MED ORDER — SODIUM CHLORIDE 0.9 % IV SOLN
100.0000 mg | Freq: Every day | INTRAVENOUS | Status: DC
  Administered 2020-02-21 – 2020-02-22 (×2): 100 mg via INTRAVENOUS
  Filled 2020-02-20 (×3): qty 20

## 2020-02-20 MED ORDER — OXYCODONE HCL 5 MG PO TABS: 5.0000 mg | ORAL_TABLET | ORAL | Status: DC | PRN

## 2020-02-20 MED ORDER — ACETAMINOPHEN 325 MG PO TABS
650.0000 mg | ORAL_TABLET | Freq: Four times a day (QID) | ORAL | Status: DC | PRN
  Administered 2020-02-21 (×2): 650 mg via ORAL
  Filled 2020-02-20 (×3): qty 2

## 2020-02-20 MED ORDER — CALCIUM GLUCONATE 10 % IV SOLN
1.0000 g | Freq: Once | INTRAVENOUS | Status: AC
  Administered 2020-02-20: 1 g via INTRAVENOUS
  Filled 2020-02-20: qty 10

## 2020-02-20 MED ORDER — RENA-VITE PO TABS
1.0000 | ORAL_TABLET | Freq: Every day | ORAL | Status: DC
  Administered 2020-02-20 – 2020-02-23 (×3): 1 via ORAL
  Filled 2020-02-20 (×4): qty 1

## 2020-02-20 MED ORDER — HEPARIN SODIUM (PORCINE) 5000 UNIT/ML IJ SOLN
5000.0000 [IU] | Freq: Three times a day (TID) | INTRAMUSCULAR | Status: DC
  Administered 2020-02-20 – 2020-03-02 (×33): 5000 [IU] via SUBCUTANEOUS
  Filled 2020-02-20 (×34): qty 1

## 2020-02-20 MED ORDER — SODIUM CHLORIDE 0.9 % IV BOLUS
1000.0000 mL | Freq: Once | INTRAVENOUS | Status: AC
  Administered 2020-02-20: 1000 mL via INTRAVENOUS

## 2020-02-20 MED ORDER — ONDANSETRON HCL 4 MG/2ML IJ SOLN: 4.0000 mg | Freq: Four times a day (QID) | INTRAMUSCULAR | Status: DC | PRN

## 2020-02-20 MED ORDER — ACETAMINOPHEN 325 MG PO TABS
650.0000 mg | ORAL_TABLET | Freq: Once | ORAL | Status: AC
  Administered 2020-02-20: 650 mg via ORAL
  Filled 2020-02-20: qty 2

## 2020-02-20 MED ORDER — ONDANSETRON HCL 4 MG PO TABS: 4.0000 mg | ORAL_TABLET | Freq: Four times a day (QID) | ORAL | Status: DC | PRN

## 2020-02-20 MED ORDER — ZINC SULFATE 220 (50 ZN) MG PO CAPS
220.0000 mg | ORAL_CAPSULE | Freq: Every day | ORAL | Status: DC
  Administered 2020-02-20 – 2020-02-24 (×5): 220 mg via ORAL
  Filled 2020-02-20 (×5): qty 1

## 2020-02-20 MED ORDER — RIFAMPIN 300 MG PO CAPS
300.0000 mg | ORAL_CAPSULE | Freq: Two times a day (BID) | ORAL | Status: DC
  Administered 2020-02-20 – 2020-02-22 (×4): 300 mg via ORAL
  Filled 2020-02-20 (×7): qty 1

## 2020-02-20 NOTE — ED Notes (Signed)
Report given to dialysis nurse.

## 2020-02-20 NOTE — ED Notes (Signed)
Two lab technicians & three nurses have attempted blood draw. Pt a difficult stick.

## 2020-02-20 NOTE — ED Notes (Signed)
Unable to get Blood cultures from IV site, pt is HD pt very hard stick, provider notified.

## 2020-02-20 NOTE — ED Notes (Signed)
IV team has been contacted twice to stick the pt. IV team has not seen the pt yet. This RN will place another IV team order.

## 2020-02-20 NOTE — Progress Notes (Signed)
MRI attempted. Pt aborted exam stating he was not going back into the scanner. Offered to call to see if could get anti-anxiety meds. Pt stated he was not going to do MRI today. RN notified.

## 2020-02-20 NOTE — ED Notes (Signed)
IV team refused to come .

## 2020-02-20 NOTE — ED Provider Notes (Signed)
[facility] EMERGENCY DEPARTMENT Provider Note   CSN: [id number] Arrival date & time:       History Chief Complaint  Patient presents with  . Covid Positive  . AMS    [name]. is a 69 y.o. male with multiple comorbidities, see below who presents for evaluation of fever and altered mental status.  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  Patient is ESRD Monday Wednesday Friday.  Per [facility] patient is new to them.  EMS was called out today due to daughter worried that patient would not get hemodialysis tomorrow on his normal schedule time.  He was diagnosed with Covid yesterday.  Per EMS they went to evaluate patient he was noted to be acutely altered.  Patient denied any complaints.  He was febrile on arrival.  Patient is unsure if he still makes urine  Level 5 Caveat-Altered Mental Status  Patient states he does not have any complaints.  Per daughter [name] he can be reached at [phone]  Patient's daughter states she has been concerned about patient since his recent Covid diagnosis.  Was called this morning at 6 AM and was told he was Covid positive.  Daughter states patient seemed confused on the phone and that is why she called 911.  Daughter feels patient is not being appropriately taken care of at [facility] rehab.  They have missed the outpatient infectious disease and neurosurgery appointments.  She is called to reschedule these however [facility] has not been able to transport patient to these appointments and is subsequently missed them.  She does not think he has been taking his medications outpatient.  He is normally patient is alert and oriented and is able to take care of himself.  States his altered mental status today is abnormal for him.  There is recently been a Covid outbreak at [facility].   Daughter states she does not want patient returning to go over to rehab as she does not feel he is being adequately treated in this facility.  Per [facility] from [facility] patient has been taking Rifampin BID and getting Vac at HD  HPI     Past Medical History:  Diagnosis Date  . Anemia   . Arthritis    left knee  . Chronic kidney disease    acute renal failure/injury requiring short-term HD 2013  . Colon cancer ([place])   . Colon polyp    11/2011 - Polyps identified, biopsy - invasive adenocarinoma  . DM II (diabetes mellitus, type II), controlled ([place])    type 2 IDDM x 15 years. A1C 1/09 13.7.   Marland Kitchen 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 12/25/2011  . GERD (gastroesophageal reflux disease)   . Heart murmur   . 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 Hyperlipidemia   . Hypertension    for a few years and resolved in 2013/2014.  . Sleep apnea    does not wear CPAP now  . Wears glasses     Patient Active Problem List   Diagnosis Date Noted  . Hemodialysis patient ([place])   . Bacteremia associated with intravascular line ([place])   . MRSA bacteremia 01/05/2020  . 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 Acute encephalopathy 01/04/2020  . Symptomatic anemia 02/11/2019  . Type 2 diabetes mellitus with renal complication ([place]) 78/24/2353  . ESRD (end stage renal disease) ([place])   . Cardiac  murmur 01/08/2019  . Preoperative clearance 01/08/2019  . Uncontrolled Type 2 DM 09/15/2013  . HYPERTENSION 03/08/2013  . CKD  06/24/2012  . Colon cancer, status Post resection  05/11/2012  . Malnutrition of moderate degree ([place]) 02/24/2012  . Health care maintenance 11/27/2011  . Obesity (BMI 30.0-34.9) 10/20/2011  . Cataract, bilateral 08/29/2011  . Hyperlipidemia LDL goal <70 04/14/2006  . MICROCYTIC ANEMIA 04/14/2006  . TOBACCO ABUSE 04/14/2006  . OBSTRUCTIVE SLEEP APNEA 04/14/2006    Past Surgical History:  Procedure Laterality Date  . APPLICATION OF WOUND VAC  02/16/2012   Procedure: APPLICATION  OF WOUND VAC;  Surgeon: Zenovia Jarred, MD;  Location: [place];  Service: General;  Laterality: N/A;  . APPLICATION OF WOUND VAC  02/26/2012   Procedure: APPLICATION OF WOUND VAC;  Surgeon: Imogene Burn. [name], MD;  Location: [place];  Service: General;  Laterality: N/A;  . AV FISTULA PLACEMENT Left 07/29/2019   Procedure: ARTERIOVENOUS (AV) FISTULA CREATION;  Surgeon: Marty Heck, MD;  Location: [facility];  Service: Vascular;  Laterality: Left;  . COLONOSCOPY    . COLONOSCOPY  02/06/2012   Procedure: COLONOSCOPY;  Surgeon: Milus Banister, MD;  Location: [facility];  Service: Endoscopy;;  . COLONOSCOPY WITH PROPOFOL N/A 06/10/2019   Procedure: COLONOSCOPY WITH PROPOFOL;  Surgeon: Milus Banister, MD;  Location: [facility];  Service: Endoscopy;  Laterality: N/A;  . COLOSTOMY REVERSAL    . DRESSING CHANGE UNDER ANESTHESIA  02/18/2012   Procedure: DRESSING CHANGE UNDER ANESTHESIA;  Surgeon: Odis Hollingshead, MD;  Location: [facility];  Service: General;  Laterality: N/A;  . ESOPHAGOGASTRODUODENOSCOPY (EGD) WITH PROPOFOL N/A 06/10/2019   Procedure: ESOPHAGOGASTRODUODENOSCOPY (EGD) WITH PROPOFOL;  Surgeon: Milus Banister, MD;  [facility];  Service: Endoscopy;  Laterality: N/A;  . GANGLION CYST EXCISION  20 YRS AGO   RT ARM  . ILEOSTOMY  02/16/2012   Procedure: ILEOSTOMY;  Surgeon: Zenovia Jarred, MD;  Location: [place];  Service: General;  Laterality: N/A;  . ILEOSTOMY CLOSURE N/A 09/02/2012   Procedure: ILEOSTOMY REVERSAL;  Surgeon: Imogene Burn. [name], MD;  Location: [place];  Service: General;  Laterality: N/A;  . ILIOSTOMY    . INCISION AND DRAINAGE OF WOUND  02/23/2012   Procedure: IRRIGATION AND DEBRIDEMENT WOUND;  Surgeon: Joyice Faster. Cornett, MD;  Location: [place];  Service: General;  Laterality: N/A;  . IR DIALY SHUNT INTRO [place] W/IMG LEFT Left 01/21/2020  . IR FLUORO GUIDE CV LINE RIGHT  01/21/2020  . IR REMOVAL TUN CV CATH W/O FL  01/07/2020  . IR US GUIDE VASC ACCESS LEFT   01/21/2020  . IR US GUIDE VASC ACCESS RIGHT  01/21/2020  . LAPAROTOMY  02/16/2012   Procedure: EXPLORATORY LAPAROTOMY;  Surgeon: Zenovia Jarred, MD;  Location: [place];  Service: General;  Laterality: N/A;  Exploratory Laparotomy with resection of anastomosis  . LAPAROTOMY  02/18/2012   Procedure: EXPLORATORY LAPAROTOMY;  Surgeon: Odis Hollingshead, MD;  Location: [place];  Service: General;  Laterality: N/A;  exploratory laparotomy  change of abdominal vac dressing  . LAPAROTOMY  02/20/2012   Procedure: EXPLORATORY LAPAROTOMY;  Surgeon: Odis Hollingshead, MD;  Location: [place];  Service: General;  Laterality: N/A;  . LAPAROTOMY  02/23/2012   Procedure: EXPLORATORY LAPAROTOMY;  Surgeon: Joyice Faster. Cornett, MD;  Location: [place];  Service: General;  Laterality: N/A;  Irrigation and Debridement of abdominal wound with wound vac change with partial closure  . LAPAROTOMY  02/26/2012   Procedure: EXPLORATORY LAPAROTOMY;  Surgeon: Imogene Burn. [name], MD;  Location: [place];  Service: General;  Laterality: N/A;     . PARTIAL COLECTOMY  02/06/2012  . PARTIAL COLECTOMY  02/06/2012   Procedure: PARTIAL COLECTOMY;  Surgeon: Imogene Burn. [name], MD;  Location: [facility];  Service: General;  Laterality: N/A;  right partial colectomy  . POSTERIOR CERVICAL FUSION/FORAMINOTOMY N/A 01/06/2020   Procedure: CERVICAL FOUR- THORACIC TWO LAMINECTOMY AND FUSION;  Surgeon: Consuella Lose, MD;  Location: [place];  Service: Neurosurgery;  Laterality: N/A;  CERVICAL FOUR- THORACIC TWO LAMINECTOMY AND FUSION  . RESECTION SMALL BOWEL / CLOSURE ILEOSTOMY  402/2014   Dr [name]  . VACUUM ASSISTED CLOSURE CHANGE  02/20/2012   Procedure: ABDOMINAL VACUUM ASSISTED CLOSURE CHANGE;  Surgeon: Odis Hollingshead, MD;  Location: [place];  Service: General;;  . VACUUM ASSISTED CLOSURE CHANGE  02/23/2012   Procedure: ABDOMINAL VACUUM ASSISTED CLOSURE CHANGE;  Surgeon: Joyice Faster. Cornett, MD;  Location: [facility];  Service: General;  Laterality: N/A;       Family  History  Problem Relation Age of Onset  . Diabetes Father   . Heart disease Father   . Hypertension Mother   . Diabetes Sister   . Heart disease Sister   . Colon cancer Neg Hx     Social History   Tobacco Use  . Smoking status: Current Every Day Smoker    Packs/day: 0.50    Years: 30.00    Pack years: 15.00    Types: Cigarettes  . Smokeless tobacco: Never Used  Substance Use Topics  . Alcohol use: Yes    Comment: Liquor twice monthly.  . Drug use: Yes    Types: Cocaine    Comment: Crack Cocaine used last night (01/03/20)    Home Medications Prior to Admission medications   Medication Sig Start Date End Date Taking? Authorizing Provider  amLODipine (NORVASC) 10 MG tablet Take 1 tablet (10 mg total) by mouth at bedtime. 01/24/20 02/23/20  [place] Donnamarie Poag, DO  atorvastatin (LIPITOR) 20 MG tablet Take 1 tablet (20 mg total) by mouth at bedtime. Patient taking differently: Take 20 mg by mouth every other day.  01/24/20 02/23/20  [place] Eric J, DO  calcitRIOL (ROCALTROL) 0.25 MCG capsule Take 5 capsules (1.25 mcg total) by mouth 3 (three) times a week. 01/24/20 02/23/20  [place] Donnamarie Poag, DO  clonazePAM (KLONOPIN) 0.5 MG tablet Take 1 tablet (0.5 mg total) by mouth 2 (two) times daily as needed (Anxiety/agitation). 01/24/20 02/23/20  [place] Donnamarie Poag, DO  Darbepoetin Alfa (ARANESP) 100 MCG/0.5ML SOSY injection Inject 0.5 mLs (100 mcg total) into the vein every Monday with hemodialysis. 01/24/20   [place] Eric J, DO  insulin detemir (LEVEMIR) 100 UNIT/ML injection Inject 0.11 mLs (11 Units total) into the skin 2 (two) times daily. Patient taking differently: Inject 22 Units into the skin daily.  01/24/20 02/23/20  [place] Eric J, DO  metoprolol tartrate (LOPRESSOR) 25 MG tablet Take 1 tablet (25 mg total) by mouth 2 (two) times daily. Patient not taking: Reported on 01/28/2020 01/24/20 02/23/20  [place] Eric J, DO  multivitamin (RENA-VIT) TABS tablet Take 1 tablet by mouth at bedtime. 01/24/20  02/23/20  [place] Donnamarie Poag, DO  QUEtiapine (SEROQUEL) 25 MG tablet Take 0.5 tablets (12.5 mg total) by mouth at bedtime. Patient not taking: Reported on 01/28/2020 01/24/20 02/23/20  [place] Eric J, DO  rifampin (RIFADIN) 300 MG capsule Take 1 capsule (300 mg total) by mouth every  12 (twelve) hours. 9/52/84 13/2/44  Delora Fuel, MD  sevelamer carbonate (RENVELA) 800 MG tablet Take 2 tablets (1,600 mg total) by mouth 3 (three) times daily with meals. Patient not taking: Reported on 01/28/2020 01/24/20 02/23/20  [place] Eric J, DO    Allergies    Patient has no known allergies.  Review of Systems   Review of Systems  Unable to perform ROS: Mental status change  Constitutional: Positive for fever.  HENT: Negative.   Respiratory: Negative.   Gastrointestinal: Negative for abdominal pain, nausea and vomiting.  Musculoskeletal: Negative for neck pain and neck stiffness.  Skin: Negative.   Neurological: Negative for seizures, weakness and headaches.  All other systems reviewed and are negative.   Physical Exam Updated Vital Signs BP (!) 123/58   Pulse 85   Temp (!) 102.4 F (39.1 C) (Oral)   Resp (!) 26   Ht 5' 11.5" (1.816 m)   Wt 86 kg   SpO2 99%   BMI 26.07 kg/m   Physical Exam Vitals and nursing note reviewed.  Constitutional:      General: He is not in acute distress.    Appearance: He is well-developed. He is ill-appearing. He is not diaphoretic.     Comments: Pleasantly confused   HENT:     Head: Normocephalic and atraumatic.     Jaw: There is normal jaw occlusion.     Mouth/Throat:     Lips: Pink.     Mouth: Mucous membranes are dry.     Pharynx: Oropharynx is clear.     Tonsils: 0 on the right. 0 on the left.     Comments: Mucous membranes very dry Eyes:     Pupils: Pupils are equal, round, and reactive to light.     Comments: PERLLA, EOM intact  Neck:     Trachea: Trachea and phonation normal.     Comments: Full ROM without difficulty. Surgical site with staples  to posterior neck.  Surrounding erythema, warmth, fluctuance or induration.  No active bleeding or drainage from surgical site Cardiovascular:     Rate and Rhythm: Normal rate and regular rhythm.     Pulses:          Radial pulses are 1+ on the right side and 1+ on the left side.       Dorsalis pedis pulses are 1+ on the right side and 1+ on the left side.       Posterior tibial pulses are 1+ on the right side and 1+ on the left side.     Heart sounds: Normal heart sounds.  Pulmonary:     Effort: Pulmonary effort is normal. No respiratory distress.     Comments: Speaks in full sentences without difficulty.  Mild wheeze left greater than right.  No respiratory distress Chest:     Comments: Equal rise and fall to chest wall.  No crepitus.  Dialysis catheter to right upper chest wall without erythema, warmth or drainage Abdominal:     General: Bowel sounds are normal. There is no distension.     Palpations: Abdomen is soft.     Tenderness: There is no abdominal tenderness. There is no right CVA tenderness, left CVA tenderness, guarding or rebound.     Hernia: No hernia is present.     Comments: Soft, nontender without rebound or guarding, old surgical sites with evidence of active infection  Genitourinary:    Penis: Normal.      Testes: Normal.  Comments: Light brown stool in brief Musculoskeletal:        General: Normal range of motion.     Cervical back: Full passive range of motion without pain, normal range of motion and neck supple.     Comments: No midline spinal tenderness, crepitus or step-offs.  There is all 4 extremities at difficulty.  No bony tenderness.  Compartments soft  AV fistula to LUE thrill  Feet:     Right foot:     Toenail Condition: Right toenails are abnormally thick and long.     Left foot:     Toenail Condition: Left toenails are abnormally thick and long.  Skin:    General: Skin is warm and dry.     Capillary Refill: Capillary refill takes less than 2  seconds.     Comments: No edema, erythema or warmth.  No fluctuance or induration  Neurological:     Mental Status: He is confused.     Comments: Alert to person however not place, time states year is 2004 or date of birth Cranial nerves II through XII grossly intact Equal handgrip bilaterally Intact sensation     ED Results / Procedures / Treatments   Labs (all labs ordered are listed, but only abnormal results are displayed) Labs Reviewed  CBC WITH DIFFERENTIAL/PLATELET - Abnormal; Notable for the following components:      Result Value   RBC 3.27 (*)    Hemoglobin 8.5 (*)    HCT 26.7 (*)    RDW 17.8 (*)    All other components within normal limits  COMPREHENSIVE METABOLIC PANEL - Abnormal; Notable for the following components:   Sodium 131 (*)    Potassium 6.2 (*)    CO2 15 (*)    BUN 82 (*)    Creatinine, Ser 13.66 (*)    Calcium 7.9 (*)    Albumin 2.5 (*)    GFR calc non Af Amer 3 (*)    GFR calc Af Amer 4 (*)    Anion gap 17 (*)    All other components within normal limits  D-DIMER, QUANTITATIVE (NOT AT [facility]) - Abnormal; Notable for the following components:   D-Dimer, Quant 2.06 (*)    All other components within normal limits  LACTATE DEHYDROGENASE - Abnormal; Notable for the following components:   LDH 241 (*)    All other components within normal limits  FIBRINOGEN - Abnormal; Notable for the following components:   Fibrinogen >800 (*)    All other components within normal limits  SARS CORONAVIRUS 2 BY RT PCR (HOSPITAL ORDER, [facility])  CULTURE, BLOOD (ROUTINE X 2)  CULTURE, BLOOD (ROUTINE X 2)  LACTIC ACID, PLASMA  PROCALCITONIN  TRIGLYCERIDES  LACTIC ACID, PLASMA  SEDIMENTATION RATE  C-REACTIVE PROTEIN  FERRITIN  GLUCOSE, RANDOM    EKG None  Radiology CT Head Wo Contrast  Result Date: 02/20/2020 CLINICAL DATA:  Delirium.  COVID-19 positive.  Recent neck surgery. EXAM: CT HEAD WITHOUT CONTRAST TECHNIQUE: Contiguous  axial images were obtained from the base of the skull through the vertex without intravenous contrast. COMPARISON:  January 28, 2020 FINDINGS: Brain: No subdural, epidural, or subarachnoid hemorrhage. A lacunar infarct in the right thalamus is stable and nonacute. Moderate white matter changes again identified. No acute cortical ischemia or acute infarct noted. No mass effect or midline shift. Ventricles and sulci are unchanged. Cerebellum, brainstem, and basal cisterns are normal. Vascular: Calcified atherosclerosis in the intracranial carotids. Skull: Normal. Negative  for fracture or focal lesion. Sinuses/Orbits: Mild scattered mucosal thickening in the ethmoid air cells and left maxillary sinus. No air-fluid levels. Mastoid air cells and middle ears are well aerated. Other: None. IMPRESSION: 1. No acute intracranial abnormalities. Chronic white matter changes. Chronic right thalamic lacunar infarct. 2. Mild sinus disease as above. Electronically Signed   By: Dorise Bullion III M.D   On: 02/20/2020 14:49   CT Cervical Spine Wo Contrast  Result Date: 02/20/2020 CLINICAL DATA:  COVID positive, recent neck surgery EXAM: CT CERVICAL SPINE WITHOUT CONTRAST TECHNIQUE: Multidetector CT imaging of the cervical spine was performed without intravenous contrast. Multiplanar CT image reconstructions were also generated. COMPARISON:  CT cervical spine, 01/28/2020 FINDINGS: Alignment: Normal. Skull base and vertebrae: No acute fracture. No primary bone lesion. Erosive change of the dens and adjacent transverse process of C1 are not significantly changed compared to prior examination. Soft tissues and spinal canal: Assessment of the soft tissues is somewhat limited by metallic streak artifact and lack of intravenous contrast; within this limitation, no unexpected fluid collection overlying the laminectomy site. Prevertebral soft tissue edema appears reduced compared to prior examination. No visible canal hematoma. Disc  levels: Status post posterior laminectomy and fusion of C4 through T1, with moderate disc space height loss and osteophytosis of C6-C7 and C7-T1. Upper chest: Negative. Other: None. IMPRESSION: 1. Redemonstrated postoperative findings status post posterior laminectomy and fusion of C4 through T1, with moderate disc space height loss and osteophytosis of C6-C7 and C7-T1. 2. Prevertebral soft tissue edema appears reduced compared to prior examination. 3. Postoperative fluid collection overlying laminectomy site does not appear significantly changed compared to prior examination. 4. Assessment of the soft tissues and epidural space is generally limited on noncontrast CT and additionally limited by metallic streak artifact; contrast enhanced MRI is the test of choice for evaluation of edema, fluid collections, the epidural space, and discitis osteomyelitis if clinically suspected in the postoperative setting. Electronically Signed   By: Eddie Candle M.D.   On: 02/20/2020 14:53   DG Chest Port 1 View  Result Date: 02/20/2020 CLINICAL DATA:  COVID EXAM: PORTABLE CHEST 1 VIEW COMPARISON:  01/12/2020 FINDINGS: Interval extubation. Interval placement of large-bore right neck vascular catheter, tip position near the superior cavoatrial junction. The heart size and mediastinal contours are within normal limits. Both lungs are clear. The visualized skeletal structures are unremarkable. IMPRESSION: 1. Interval placement of large-bore right neck vascular catheter, tip position near the superior cavoatrial junction. 2. No acute abnormality of the lungs in AP portable projection. Electronically Signed   By: Eddie Candle M.D.   On: 02/20/2020 13:43    Procedures .Critical Care Performed by: Nettie Elm, PA-C Authorized by: Nettie Elm, PA-C   Critical care provider statement:    Critical care time (minutes):  45   Critical care was necessary to treat or prevent imminent or life-threatening deterioration  of the following conditions:  Sepsis, metabolic crisis and renal failure   Critical care was time spent personally by me on the following activities:  Discussions with consultants, evaluation of patient's response to treatment, examination of patient, ordering and performing treatments and interventions, ordering and review of laboratory studies, ordering and review of radiographic studies, pulse oximetry, re-evaluation of patient's condition, obtaining history from patient or surrogate and review of old charts   (including critical care time)  Medications Ordered in ED Medications  insulin aspart (novoLOG) injection 5 Units (has no administration in time range)    And  dextrose 50 % solution 50 mL (50 mLs Intravenous Given 02/20/20 1443)  acetaminophen (TYLENOL) tablet 650 mg (650 mg Oral Given 02/20/20 1313)  sodium chloride 0.9 % bolus 1,000 mL (1,000 mLs Intravenous New Bag/Given 02/20/20 1313)  calcium gluconate inj 10% (1 g) URGENT USE ONLY! (1 g Intravenous Given 02/20/20 1444)   ED Course  I have reviewed the triage vital signs and the nursing notes.  Pertinent labs & imaging results that were available during my care of the patient were reviewed by me and considered in my medical decision making (see chart for details).   69 year old male presents for evaluation of altered mental status.  He is febrile here does not appear septic however appears chronically ill.  Unfortunately had a recent diagnosis 6 weeks ago for MRSA bacteremia as well as cervical epidural abscess and osteomyelitis.  Diagnosed with Covid today per [facility] rehab.  EMS arrival today patient noted to be altered.  He does have some very minimal wheezes left lung greater than right.  He does not appear grossly fluid overloaded.  His abdomen is soft.  He has full range of motion to his neck.  His posterior cervical region does have intact staples however now edema, erythema, warmth, drainage.  Per EMS they stated they  originally called out as daughter was concerned patient would not be able to attend dialysis tomorrow.  On EMS arrival patient was noted to be confused however had no complaints.  Subsequently brought to emergency department for further evaluation.   Plan on labs, imaging and reassess.  Hold on calling code sepsis due to possible fever due to known Covid infection. (Patient already on Rifampin and Vanc outpatient per [facility] [place]) Appears patient was supposed to be following with ID outpatient however I do not see records of this visit after his discharge from the hospital 3 weeks ago. Patient was actually seen in the emergency department on 01/28/2020 after mechanical fall.  Patient had MRI at that time the cervical region was noted to have continued fluid collection, phlegmon infectious process.  Neurosurgery, Meyran, NP and Dr. Annette Stable were contacted and had personally reviewed images at that time and thought MRI was postop changes and did not feel patient had active infection per prior ED notes.  Per ED note patient was felt to be on rifampin and Vanco however he had not followed up at that time.  He was given new prescription from the emergency department and was supposed to follow up with ID and Neurosurgery outpatient. He was also supposed to be wearing an Aspen collar at all times until [place] FU however patient not not been compliant. Patient refused collar here in ED.  Verbal orders for IVF started. Patient only received 200cc NOT the 1L order in Epic due to ESRD status and hold on code sepsis.  Labs and imaging personally reviewed and interpreted:  DG chest without evidence of cardiomegaly, pulmonary edema, pneumothorax, infectious process CBC without leukocytosis, hemoglobin 8.5 D-dimer elevated at 2.06 Lactic acid 1.1 Metabolic panel with hyponatremia to 131 hyperkalemia to 6.2, BUN 82, creatinine 13.66, calcium 7.9, anion gap 17 CT head with chronic infarcts, no acute changes CT  cervical with post op changes however recommend MR for further evaluation  Patient with significantly elevated potassium at 6.2.  Was given calcium as well as insulin and glucose per attending Dr. Sedonia Small. EKG with No STEMI however with peaked T waves.  CONSULT with Dr. Horton Finer with Nephrology, will evaluate patient for dialysis  Care transferred to [facility], PA-C who will follow up on remaining labs, imaging and admission.  Patient personally read by attending, Dr. Sedonia Small who agrees above treatment and plan   MDM Rules/Calculators/A&P                           Final Clinical Impression(s) / ED Diagnoses Final diagnoses:  COVID-19  Uremia  ESRD (end stage renal disease) ([place])  Hyperkalemia  Metabolic encephalopathy  Hx of bacteremia    Rx / DC Orders ED Discharge Orders    None       Algenis Ballin A, PA-C 02/20/20 1518    Maudie Flakes, MD 02/20/20 1525

## 2020-02-20 NOTE — Progress Notes (Signed)
Two consults have been placed to IV Team;  Pt needing blood work drawn; Armed forces training and education officer spoke to the RN caring for the patient, explaining that we are not the back up for the Lab; the patient has a 22ga catheter which is sufficient for Remdesivir;   IV team will place another IV site if the patient is receiving multiple incompatible medications, but IVs are not placed for the purpose of drawing blood work;

## 2020-02-20 NOTE — ED Provider Notes (Signed)
Care assumed from [facility], PA-C at shift change with labs pending.   In brief, this patient is a 69 y.o. M with PMH/o Dialysis who has been currently at [facility] rehab center status post decompression for cervical osteomyelitis and epidural abscess presents for evaluation of fever, altered mental status.  Daughter states that she was told patient was Covid positive but there is no documentation.  He was admitted 6 weeks ago for osteomyelitis and epidural abscess for which she had cervical decompression.  He was discharged to [facility].  Daughter states there is comes concerned he has been getting antibiotics but the [facility] record does show he has been getting them.  She states that sometimes he will pocket them and set of actually taking them.  He was initially seen by Dr. Trenton Gammon (neurosurgery).  He has not followed up with ID or neurosurgeon since this happened.  He did have a repeat MRI about 2 weeks ago that showed some residual swelling.  Neurosurgery evaluated this and said that this was most likely residual postop swelling.  Daughter reports that at baseline, patient is alert and oriented x3 and is able to get up.  Currently, he is alert and oriented x1.  Please see note from previous provider for full history/physical exam.  Physical Exam  BP 125/63   Pulse 100   Temp 99 F (37.2 C) (Oral)   Resp (!) 22   Ht 5' 11.5" (1.816 m)   Wt 86 kg   SpO2 100%   BMI 26.07 kg/m   Physical Exam  ED Course/Procedures     Procedures  MDM   PLAN: Patient pending Covid test, labs.  He has not been started on antibiotics given presumed Covid positive.  MDM:  Neck site is without any warmth, swelling, drainage.  He has full range of motion of cervical spine without any difficulty.  Doubt infectious etiology but MRI is pending.  Nephrology NP has evaluated patient.  At this time, patient does not appear fluid overloaded.  Patient will be set for dialysis tomorrow morning.  Glucose is 149.   CMP shows potassium of 6.2.  BUN is 82, creatinine of 13.66.  Lactic is 1.1.  CBC shows no leukocytosis.  Hemoglobin is 8.5.  Covid positive.  CT C-spine shows redemonstrated postoperative findings status post posterior laminectomy and fusion of C4-T1.  Prevertebral soft tissue edema appears reduced compared to prior examination.  CT head is unremarkable.  Hyperkalemia has not been addressed by previous provider with insulin, dextrose, calcium gluconate.  She has also ordered [facility] per nephrology instruction.  Nephrology will plan to consult on patient.  At this time, given that he is Covid positive, altered and in need of dialysis, will plan for admission.  Discussed patient with Dr. Doristine Bosworth (hospitalist) who accepts patient for admission.   1. COVID-19   2. Uremia   3. ESRD (end stage renal disease) ([place])   4. Hyperkalemia   5. Metabolic encephalopathy   6. Hx of bacteremia     Portions of this note were generated with Lobbyist. Dictation errors may occur despite best attempts at proofreading.    Volanda Napoleon, PA-C 02/21/20 0045    Breck Coons, MD 02/21/20 [phone]

## 2020-02-20 NOTE — ED Notes (Signed)
Patient transported to CT 

## 2020-02-20 NOTE — Progress Notes (Signed)
Pharmacy Antibiotic Note  [name] [name]. is a 69 y.o. male admitted on 02/20/2020 with epidural abscess on IV vancomycin PTA for MRSA bacteremia with epidural abscess s/p spinal fusion.  Pharmacy has been consulted for resume vancomycin dosing. WBC wnl. Of note, patient has a h/o CKD on MWF HD   Plan: -Vancomycin 1 gm IV Q HD. Next due tomorrow with HD -Monitor clinical progress and cultures -VT at [facility]   Height: 5' 11.5" (181.6 cm) Weight: 86 kg (189 lb 9.5 oz) IBW/kg (Calculated) : 76.45  Temp (24hrs), Avg:100.7 F (38.2 C), Min:99 F (37.2 C), Max:102.4 F (39.1 C)  Recent Labs  Lab 02/20/20 1315  WBC 4.7  CREATININE 13.66*  LATICACIDVEN 1.1    Estimated Creatinine Clearance: 5.6 mL/min (A) (by C-G formula based on SCr of 13.66 mg/dL (H)).    No Known Allergies  Antimicrobials this admission: Vanc 9/19>>  Dose adjustments this admission:   Microbiology results:   Thank you for allowing pharmacy to be a part of this patient's care.  Albertina Parr, PharmD., BCPS, BCCCP Clinical Pharmacist Clinical phone for 02/20/20 until 10pm: [phone] If after 10pm, please refer to [facility] for unit-specific pharmacist

## 2020-02-20 NOTE — ED Notes (Signed)
Pt was agitated during MRI, unable to obtain one.

## 2020-02-20 NOTE — Consult Note (Addendum)
[facility] Renal Consultation Note    Indication for Consultation:  Management of ESRD/hemodialysis; anemia, hypertension/volume and secondary hyperparathyroidism PCP:  HPI: [name]. is a 69 y.o. male with ESRD on hemodialysis MWF at [facility]. PMH: DM, HTN, Hep C,  polysubstance abuse,  recent admission 08/03-08/23/2021 with MRSA bacteremia,epidural abscess S/P laminectomy and fusion 01/06/2020. He was discharged to SNF for rehabilitation. He was seen in ED 01/28/2020 for fall.   Last HD 02/18/2020 stayed on treatment 1:22 hrs of 4 hour treatment. He has had 5.71 hours of HD last week.    He was diagnosed with COVID 19 02/19/2020 at [employer]. He was brought to ED from [facility] at the request of daughter D/T concern he would not get his HD tomorrow. She also reported AMS. Upon arrival to ED, SCr 13.66 BUN 82 CO2 15 K+6.2 WBC 4.7 HGB 8.5 181. CXR unremarkable. CT of head without acute changes.   He is oriented to self only but does follow commands. Cannot contribute meaningfully to HPI. No asterixis. He has rec'd insulin, calcium gluconate, D50w in ED. Will give Lokelma 10 grams PO. Urgent HD tonight for hyperkalemia/uremia. He will be admitted for management of COVID 19, AMS per primary.   Past Medical History:  Diagnosis Date  . Anemia   . Arthritis    left knee  . Chronic kidney disease    acute renal failure/injury requiring short-term HD 2013  . Colon cancer ([place])   . Colon polyp    11/2011 - Polyps identified, biopsy - invasive adenocarinoma  . DM II (diabetes mellitus, type II), controlled ([place])    type 2 IDDM x 15 years. A1C 1/09 13.7.   Marland Kitchen 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 12/25/2011  . GERD (gastroesophageal reflux disease)   . Heart murmur   . 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 Hyperlipidemia   . Hypertension    for a few years and resolved in 2013/2014.  . Sleep apnea    does not wear CPAP now  .  Wears glasses    Past Surgical History:  Procedure Laterality Date  . APPLICATION OF WOUND VAC  02/16/2012   Procedure: APPLICATION OF WOUND VAC;  Surgeon: Zenovia Jarred, MD;  Location: [place];  Service: General;  Laterality: N/A;  . APPLICATION OF WOUND VAC  02/26/2012   Procedure: APPLICATION OF WOUND VAC;  Surgeon: Imogene Burn. [name], MD;  Location: [place];  Service: General;  Laterality: N/A;  . AV FISTULA PLACEMENT Left 07/29/2019   Procedure: ARTERIOVENOUS (AV) FISTULA CREATION;  Surgeon: Marty Heck, MD;  Location: [place];  Service: Vascular;  Laterality: Left;  . COLONOSCOPY    . COLONOSCOPY  02/06/2012   Procedure: COLONOSCOPY;  Surgeon: Milus Banister, MD;  Location: [place];  Service: Endoscopy;;  . COLONOSCOPY WITH PROPOFOL N/A 06/10/2019   Procedure: COLONOSCOPY WITH PROPOFOL;  Surgeon: Milus Banister, MD;  Location: [facility];  Service: Endoscopy;  Laterality: N/A;  . COLOSTOMY REVERSAL    . DRESSING CHANGE UNDER ANESTHESIA  02/18/2012   Procedure: DRESSING CHANGE UNDER ANESTHESIA;  Surgeon: Odis Hollingshead, MD;  Location: [place];  Service: General;  Laterality: N/A;  . ESOPHAGOGASTRODUODENOSCOPY (EGD) WITH PROPOFOL N/A 06/10/2019   Procedure: ESOPHAGOGASTRODUODENOSCOPY (EGD) WITH PROPOFOL;  Surgeon: Milus Banister, MD;  Location: [facility];  Service: Endoscopy;  Laterality: N/A;  . GANGLION CYST EXCISION  20 YRS AGO   RT ARM  . ILEOSTOMY  02/16/2012   Procedure: ILEOSTOMY;  Surgeon: Zenovia Jarred, MD;  Location: [place];  Service: General;  Laterality: N/A;  . ILEOSTOMY CLOSURE N/A 09/02/2012   Procedure: ILEOSTOMY REVERSAL;  Surgeon: Imogene Burn. [name], MD;  Location: [place];  Service: General;  Laterality: N/A;  . ILIOSTOMY    . INCISION AND DRAINAGE OF WOUND  02/23/2012   Procedure: IRRIGATION AND DEBRIDEMENT WOUND;  Surgeon: Joyice Faster. Cornett, MD;  Location: [place];  Service: General;  Laterality: N/A;  . IR DIALY SHUNT INTRO [place] W/IMG LEFT Left  01/21/2020  . IR FLUORO GUIDE CV LINE RIGHT  01/21/2020  . IR REMOVAL TUN CV CATH W/O FL  01/07/2020  . IR US GUIDE VASC ACCESS LEFT  01/21/2020  . IR US GUIDE VASC ACCESS RIGHT  01/21/2020  . LAPAROTOMY  02/16/2012   Procedure: EXPLORATORY LAPAROTOMY;  Surgeon: Zenovia Jarred, MD;  Location: [place];  Service: General;  Laterality: N/A;  Exploratory Laparotomy with resection of anastomosis  . LAPAROTOMY  02/18/2012   Procedure: EXPLORATORY LAPAROTOMY;  Surgeon: Odis Hollingshead, MD;  Location: [place];  Service: General;  Laterality: N/A;  exploratory laparotomy  change of abdominal vac dressing  . LAPAROTOMY  02/20/2012   Procedure: EXPLORATORY LAPAROTOMY;  Surgeon: Odis Hollingshead, MD;  Location: [place];  Service: General;  Laterality: N/A;  . LAPAROTOMY  02/23/2012   Procedure: EXPLORATORY LAPAROTOMY;  Surgeon: Joyice Faster. Cornett, MD;  Location: [place];  Service: General;  Laterality: N/A;  Irrigation and Debridement of abdominal wound with wound vac change with partial closure  . LAPAROTOMY  02/26/2012   Procedure: EXPLORATORY LAPAROTOMY;  Surgeon: Imogene Burn. [name], MD;  Location: [place];  Service: General;  Laterality: N/A;     . PARTIAL COLECTOMY  02/06/2012  . PARTIAL COLECTOMY  02/06/2012   Procedure: PARTIAL COLECTOMY;  Surgeon: Imogene Burn. [name], MD;  Location: [place];  Service: General;  Laterality: N/A;  right partial colectomy  . POSTERIOR CERVICAL FUSION/FORAMINOTOMY N/A 01/06/2020   Procedure: CERVICAL FOUR- THORACIC TWO LAMINECTOMY AND FUSION;  Surgeon: Consuella Lose, MD;  Location: [place];  Service: Neurosurgery;  Laterality: N/A;  CERVICAL FOUR- THORACIC TWO LAMINECTOMY AND FUSION  . RESECTION SMALL BOWEL / CLOSURE ILEOSTOMY  402/2014   Dr [name]  . VACUUM ASSISTED CLOSURE CHANGE  02/20/2012   Procedure: ABDOMINAL VACUUM ASSISTED CLOSURE CHANGE;  Surgeon: Odis Hollingshead, MD;  Location: [place];  Service: General;;  . VACUUM ASSISTED CLOSURE CHANGE  02/23/2012   Procedure: ABDOMINAL VACUUM  ASSISTED CLOSURE CHANGE;  Surgeon: Joyice Faster. Cornett, MD;  Location: [facility];  Service: General;  Laterality: N/A;   Family History  Problem Relation Age of Onset  . Diabetes Father   . Heart disease Father   . Hypertension Mother   . Diabetes Sister   . Heart disease Sister   . Colon cancer Neg Hx    Social History:  reports that he has been smoking cigarettes. He has a 15.00 pack-year smoking history. He has never used smokeless tobacco. He reports current alcohol use. He reports current drug use. Drug: Cocaine. No Known Allergies Prior to Admission medications   Medication Sig Start Date End Date Taking? Authorizing Provider  amLODipine (NORVASC) 10 MG tablet Take 1 tablet (10 mg total) by mouth at bedtime. 01/24/20 02/23/20  [place] Donnamarie Poag, DO  atorvastatin (LIPITOR) 20 MG tablet Take 1 tablet (20 mg total) by mouth at bedtime. Patient taking differently: Take 20 mg by  mouth every other day.  01/24/20 02/23/20  [place] Eric J, DO  calcitRIOL (ROCALTROL) 0.25 MCG capsule Take 5 capsules (1.25 mcg total) by mouth 3 (three) times a week. 01/24/20 02/23/20  [place] Donnamarie Poag, DO  clonazePAM (KLONOPIN) 0.5 MG tablet Take 1 tablet (0.5 mg total) by mouth 2 (two) times daily as needed (Anxiety/agitation). 01/24/20 02/23/20  [place] Donnamarie Poag, DO  Darbepoetin Alfa (ARANESP) 100 MCG/0.5ML SOSY injection Inject 0.5 mLs (100 mcg total) into the vein every Monday with hemodialysis. 01/24/20   [place] Eric J, DO  insulin detemir (LEVEMIR) 100 UNIT/ML injection Inject 0.11 mLs (11 Units total) into the skin 2 (two) times daily. Patient taking differently: Inject 22 Units into the skin daily.  01/24/20 02/23/20  [place] Eric J, DO  metoprolol tartrate (LOPRESSOR) 25 MG tablet Take 1 tablet (25 mg total) by mouth 2 (two) times daily. Patient not taking: Reported on 01/28/2020 01/24/20 02/23/20  [place] Eric J, DO  multivitamin (RENA-VIT) TABS tablet Take 1 tablet by mouth at bedtime. 01/24/20 02/23/20  [place] Donnamarie Poag,  DO  QUEtiapine (SEROQUEL) 25 MG tablet Take 0.5 tablets (12.5 mg total) by mouth at bedtime. Patient not taking: Reported on 01/28/2020 01/24/20 02/23/20  [place] Eric J, DO  rifampin (RIFADIN) 300 MG capsule Take 1 capsule (300 mg total) by mouth every 12 (twelve) hours. 09/10/79 19/1/47  Delora Fuel, MD  sevelamer carbonate (RENVELA) 800 MG tablet Take 2 tablets (1,600 mg total) by mouth 3 (three) times daily with meals. Patient not taking: Reported on 01/28/2020 01/24/20 02/23/20  [place] Eric J, DO   Current Facility-Administered Medications  Medication Dose Route Frequency Provider Last Rate Last Admin  . insulin aspart (novoLOG) injection 5 Units  5 Units Intravenous Once Henderly, Britni A, PA-C      . sodium zirconium cyclosilicate (LOKELMA) packet 10 g  10 g Oral Once Henderly, Britni A, PA-C       Current Outpatient Medications  Medication Sig Dispense Refill  . amLODipine (NORVASC) 10 MG tablet Take 1 tablet (10 mg total) by mouth at bedtime. 30 tablet 0  . atorvastatin (LIPITOR) 20 MG tablet Take 1 tablet (20 mg total) by mouth at bedtime. (Patient taking differently: Take 20 mg by mouth every other day. ) 30 tablet 0  . calcitRIOL (ROCALTROL) 0.25 MCG capsule Take 5 capsules (1.25 mcg total) by mouth 3 (three) times a week. 60 capsule 0  . clonazePAM (KLONOPIN) 0.5 MG tablet Take 1 tablet (0.5 mg total) by mouth 2 (two) times daily as needed (Anxiety/agitation). 30 tablet 0  . Darbepoetin Alfa (ARANESP) 100 MCG/0.5ML SOSY injection Inject 0.5 mLs (100 mcg total) into the vein every Monday with hemodialysis. 4.2 mL   . insulin detemir (LEVEMIR) 100 UNIT/ML injection Inject 0.11 mLs (11 Units total) into the skin 2 (two) times daily. (Patient taking differently: Inject 22 Units into the skin daily. ) 6.6 mL 0  . metoprolol tartrate (LOPRESSOR) 25 MG tablet Take 1 tablet (25 mg total) by mouth 2 (two) times daily. (Patient not taking: Reported on 01/28/2020) 60 tablet 0  . multivitamin  (RENA-VIT) TABS tablet Take 1 tablet by mouth at bedtime. 30 tablet 0  . QUEtiapine (SEROQUEL) 25 MG tablet Take 0.5 tablets (12.5 mg total) by mouth at bedtime. (Patient not taking: Reported on 01/28/2020) 15 tablet 0  . rifampin (RIFADIN) 300 MG capsule Take 1 capsule (300 mg total) by mouth every 12 (twelve) hours. 78 capsule 0  . sevelamer carbonate (RENVELA) 800 MG  tablet Take 2 tablets (1,600 mg total) by mouth 3 (three) times daily with meals. (Patient not taking: Reported on 01/28/2020) 180 tablet 0   Labs: Basic Metabolic Panel: Recent Labs  Lab 02/20/20 1315  NA 131*  K 6.2*  CL 99  CO2 15*  GLUCOSE 87  BUN 82*  CREATININE 13.66*  CALCIUM 7.9*   Liver Function Tests: Recent Labs  Lab 02/20/20 1315  AST 34  ALT 19  ALKPHOS 81  BILITOT 0.8  PROT 8.0  ALBUMIN 2.5*   No results for input(s): LIPASE, AMYLASE in the last 168 hours. No results for input(s): AMMONIA in the last 168 hours. CBC: Recent Labs  Lab 02/20/20 1315  WBC 4.7  NEUTROABS 2.4  HGB 8.5*  HCT 26.7*  MCV 81.7  PLT 181   Cardiac Enzymes: No results for input(s): CKTOTAL, CKMB, CKMBINDEX, TROPONINI in the last 168 hours. CBG: Recent Labs  Lab 02/20/20 1529  GLUCAP 149*   Iron Studies: No results for input(s): IRON, TIBC, TRANSFERRIN, FERRITIN in the last 72 hours. Studies/Results: CT Head Wo Contrast  Result Date: 02/20/2020 CLINICAL DATA:  Delirium.  COVID-19 positive.  Recent neck surgery. EXAM: CT HEAD WITHOUT CONTRAST TECHNIQUE: Contiguous axial images were obtained from the base of the skull through the vertex without intravenous contrast. COMPARISON:  January 28, 2020 FINDINGS: Brain: No subdural, epidural, or subarachnoid hemorrhage. A lacunar infarct in the right thalamus is stable and nonacute. Moderate white matter changes again identified. No acute cortical ischemia or acute infarct noted. No mass effect or midline shift. Ventricles and sulci are unchanged. Cerebellum, brainstem, and  basal cisterns are normal. Vascular: Calcified atherosclerosis in the intracranial carotids. Skull: Normal. Negative for fracture or focal lesion. Sinuses/Orbits: Mild scattered mucosal thickening in the ethmoid air cells and left maxillary sinus. No air-fluid levels. Mastoid air cells and middle ears are well aerated. Other: None. IMPRESSION: 1. No acute intracranial abnormalities. Chronic white matter changes. Chronic right thalamic lacunar infarct. 2. Mild sinus disease as above. Electronically Signed   By: Dorise Bullion III M.D   On: 02/20/2020 14:49   CT Cervical Spine Wo Contrast  Result Date: 02/20/2020 CLINICAL DATA:  COVID positive, recent neck surgery EXAM: CT CERVICAL SPINE WITHOUT CONTRAST TECHNIQUE: Multidetector CT imaging of the cervical spine was performed without intravenous contrast. Multiplanar CT image reconstructions were also generated. COMPARISON:  CT cervical spine, 01/28/2020 FINDINGS: Alignment: Normal. Skull base and vertebrae: No acute fracture. No primary bone lesion. Erosive change of the dens and adjacent transverse process of C1 are not significantly changed compared to prior examination. Soft tissues and spinal canal: Assessment of the soft tissues is somewhat limited by metallic streak artifact and lack of intravenous contrast; within this limitation, no unexpected fluid collection overlying the laminectomy site. Prevertebral soft tissue edema appears reduced compared to prior examination. No visible canal hematoma. Disc levels: Status post posterior laminectomy and fusion of C4 through T1, with moderate disc space height loss and osteophytosis of C6-C7 and C7-T1. Upper chest: Negative. Other: None. IMPRESSION: 1. Redemonstrated postoperative findings status post posterior laminectomy and fusion of C4 through T1, with moderate disc space height loss and osteophytosis of C6-C7 and C7-T1. 2. Prevertebral soft tissue edema appears reduced compared to prior examination. 3.  Postoperative fluid collection overlying laminectomy site does not appear significantly changed compared to prior examination. 4. Assessment of the soft tissues and epidural space is generally limited on noncontrast CT and additionally limited by metallic streak artifact; contrast enhanced MRI  is the test of choice for evaluation of edema, fluid collections, the epidural space, and discitis osteomyelitis if clinically suspected in the postoperative setting. Electronically Signed   By: Eddie Candle M.D.   On: 02/20/2020 14:53   DG Chest Port 1 View  Result Date: 02/20/2020 CLINICAL DATA:  COVID EXAM: PORTABLE CHEST 1 VIEW COMPARISON:  01/12/2020 FINDINGS: Interval extubation. Interval placement of large-bore right neck vascular catheter, tip position near the superior cavoatrial junction. The heart size and mediastinal contours are within normal limits. Both lungs are clear. The visualized skeletal structures are unremarkable. IMPRESSION: 1. Interval placement of large-bore right neck vascular catheter, tip position near the superior cavoatrial junction. 2. No acute abnormality of the lungs in AP portable projection. Electronically Signed   By: Eddie Candle M.D.   On: 02/20/2020 13:43    ROS: As per HPI otherwise negative.   Physical Exam: Vitals:   02/20/20 1338 02/20/20 1400 02/20/20 1500 02/20/20 1515  BP:  (!) 123/58 (!) 116/57 (!) 97/51  Pulse:  85  81  Resp:  (!) 26 (!) 0 (!) 24  Temp:      TempSrc:      SpO2:  99%  100%  Weight: 86 kg     Height: 5' 11.5" (1.816 m)        General: Chronically ill appearing male, looks older than stated age in no acute distress. Head: Normocephalic, atraumatic, sclera non-icteric, mucus membranes are moist. Staples intact C1-C7 no drainage/erythema.  Neck: Supple. JVD not elevated. Lungs: Clear bilaterally to auscultation without wheezes, rales, or rhonchi. Slightly decreased in bases. Breathing is unlabored. Heart: RRR with S1 S2. No murmurs, rubs, or  gallops appreciated. Abdomen: Soft, non-tender, non-distended with normoactive bowel sounds. No rebound/guarding. No obvious abdominal masses. M-S:  Strength and tone appear normal for age. Lower extremities:without edema or ischemic changes, no open wounds  Neuro: Alert and oriented X 3. Moves all extremities spontaneously. Psych:  Responds to questions appropriately with a normal affect. Dialysis Access: RIJ TDC Drsg intact. L AVF + bruit  Dialysis Orders: [facility] MWF 4:15 hrs 180NRe 400/800 87 kg 2.0 K/ 2.0 Ca TDC/L AVF -No heparin -Mircera 150 mcg IV q 2 weeks (last dose 02/09/2020) -Venofer 100 mg IV X 8 doses (not started yet) -Calcitriol 1.5 mcg PO TIW  Assessment/Plan: 1.  Hyperkalemia/Uremia in setting of truncated HD treatments: Will have urgent HD tonight for hyperkalemia. Has been given Calcium Gluconate IV, Lokelma. No peaked T waves on 12 lead EKG.  2. COVID 19-recent diagnosis. Tested positive at [facility] 09/18 and again here today. CXR unremarkable. Remdesivir per protocol. Per primary 3. 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 Can't see that he has followed up with ID as ordered.  4. AMS-patient has baseline confusion. CT of head today is negative for acute changes. Currently BUN 82. Probably combination of uremia and COVID 19 infection are playing a role in AMS.  Hopefully mental status will improve with HD.  5.  ESRD - MWF has been using AVF, not sure if he is using consistently. Per HD notes, some issues with cannulation. Continue using AVF here, 16g needles today.  6.  Hypertension/volume-No evidence of volume overload by exam. BP lower than usual. Hold home BP meds until BP stabilizes. Minimal UF with HD today. Left under EDW last treatment. No weight on patient today.  7.  Anemia  - HGB 8.5. ESA due this  week. Will order to be given tonight. Hold OP Fe load.  8.  Metabolic  bone disease -Continue binders, VDRA 9.  Nutrition -NPO at present.  10.  DM-per primary  Jimmye Norman. Owens Shark, NP-C 02/20/2020, 3:35 PM  [employer] [phone]

## 2020-02-20 NOTE — ED Triage Notes (Addendum)
To ED via [facility] from [place] + -- pt is unsure when he was dx, daughter wanted pt transferred here because she was concerned that he would not get his dialysis tomorrow since he was covid positive. Pt has NO complaints. Recent neck surgery - staples intact from C1-C7 area- no drainage, incision well approximated. Reddened area to both lower buttocks. Small amount of liquid stool - ADLs done.

## 2020-02-20 NOTE — H&P (Signed)
History and Physical    Zyon Apelles Adline Mango. ZOX:[id number] DOB: 06-Jul-1950 DOA: 02/20/2020  PCP: [facility]  Patient coming from: [facility] I have personally briefly reviewed patient's old medical records in [place]  Chief Complaint: Confusion, COVID-19 positive  HPI: Junious Apelles Fiore Detjen. is a 69 y.o. male with medical history significant of hypertension, hyperlipidemia ESRD on hemodialysis on Monday Wednesday, Friday, type 2 diabetes mellitus, anemia of chronic disease, colon cancer status post resection, epidural abscess status post laminectomy, fusion on 01/06/2020 and MRSA bacteremia presents to emergency department for evaluation of confusion and COVID-19 positive.  Patient tested for COVID-19 yesterday and daughter was concerned that since he tested positive he will not get his hemodialysis tomorrow on his normal schedule time.  As per daughter: She feels that patient is not being appropriately taken care of at [facility] rehab.  They have missed outpatient infectious disease and neurosurgery appointments and has not been taking his medications.  She tells me that she is trying to change his nursing home so that he can get best care.  She tells me that at baseline: Patient is alert and oriented x1.  Family was concerned about patient therefore EMS was called and brought patient to the emergency department for further evaluation and management.  Upon arrival to EMS: Patient was noted to be altered.  Upon my evaluation: Patient lying comfortably on the bed, on room air, communicating well, alert and oriented to place only.  He denies any symptoms including headache, blurry vision, chest pain, shortness of breath, cough, congestion, nausea, vomiting, abdominal pain, urinary or bowel changes.  He tells me that he does make urine.  Denies neck pain, redness, swelling, decreased range of motion in neck.  Of note: Patient admitted on 01/04/2020 with epidural  abscess, he underwent laminectomy and fusion on 01/06/2020 and discharged on vancomycin every Monday/Wednesday/Friday with hemodialysis and rifampin 300 mg p.o. twice daily for 8 weeks (last dose would be on March 03, 2020).    ED Course: Upon arrival to ED: Patient had fever of 102.4, tachypneic, blood pressure soft, maintaining oxygen saturation on room air, COVID-19 positive, lactic acid: WNL, CBC shows anemia of chronic disease at baseline, CMP shows sodium of 131, potassium of 6.2, anion gap of 17, elevated BUN/creatinine, low GFR, D-dimer: 2.06, procalcitonin: 0.64, elevated inflammatory markers, patient was given Lokelma, calcium gluconate, IV fluid and insulin with dextrose in ED.  Chest x-ray and CT head negative for acute findings.  CT cervical spine shows postoperative changes, prevertebral soft tissue edema recommended MRI.  MRI ordered however patient could not tolerated it.  EDP consulted nephrology.  [facility] hospitalist consulted for admission for sepsis secondary to COVID-19 infection.  Review of Systems: As per HPI otherwise negative.    Past Medical History:  Diagnosis Date  . Anemia   . Arthritis    left knee  . Chronic kidney disease    acute renal failure/injury requiring short-term HD 2013  . Colon cancer ([place])   . Colon polyp    11/2011 - Polyps identified, biopsy - invasive adenocarinoma  . DM II (diabetes mellitus, type II), controlled ([place])    type 2 IDDM x 15 years. A1C 1/09 13.7.   Marland Kitchen 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 12/25/2011  . GERD (gastroesophageal reflux disease)   . Heart murmur   . 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 Hyperlipidemia   . Hypertension    for a few years and resolved in  2013/2014.  . Sleep apnea    does not wear CPAP now  . Wears glasses     Past Surgical History:  Procedure Laterality Date  . APPLICATION OF WOUND VAC  02/16/2012   Procedure: APPLICATION OF WOUND VAC;  Surgeon: Zenovia Jarred, MD;  Location: [place];  Service:  General;  Laterality: N/A;  . APPLICATION OF WOUND VAC  02/26/2012   Procedure: APPLICATION OF WOUND VAC;  Surgeon: Imogene Burn. [name], MD;  Location: [place];  Service: General;  Laterality: N/A;  . AV FISTULA PLACEMENT Left 07/29/2019   Procedure: ARTERIOVENOUS (AV) FISTULA CREATION;  Surgeon: Marty Heck, MD;  Location: [place];  Service: Vascular;  Laterality: Left;  . COLONOSCOPY    . COLONOSCOPY  02/06/2012   Procedure: COLONOSCOPY;  Surgeon: Milus Banister, MD;  Location: [place];  Service: Endoscopy;;  . COLONOSCOPY WITH PROPOFOL N/A 06/10/2019   Procedure: COLONOSCOPY WITH PROPOFOL;  Surgeon: Milus Banister, MD;  Location: [facility];  Service: Endoscopy;  Laterality: N/A;  . COLOSTOMY REVERSAL    . DRESSING CHANGE UNDER ANESTHESIA  02/18/2012   Procedure: DRESSING CHANGE UNDER ANESTHESIA;  Surgeon: Odis Hollingshead, MD;  Location: [place];  Service: General;  Laterality: N/A;  . ESOPHAGOGASTRODUODENOSCOPY (EGD) WITH PROPOFOL N/A 06/10/2019   Procedure: ESOPHAGOGASTRODUODENOSCOPY (EGD) WITH PROPOFOL;  Surgeon: Milus Banister, MD;  [facility]: [facility];  Service: Endoscopy;  Laterality: N/A;  . GANGLION CYST EXCISION  20 YRS AGO   RT ARM  . ILEOSTOMY  02/16/2012   Procedure: ILEOSTOMY;  Surgeon: Zenovia Jarred, MD;  Location: [place];  Service: General;  Laterality: N/A;  . ILEOSTOMY CLOSURE N/A 09/02/2012   Procedure: ILEOSTOMY REVERSAL;  Surgeon: Imogene Burn. [name], MD;  Location: [place];  Service: General;  Laterality: N/A;  . ILIOSTOMY    . INCISION AND DRAINAGE OF WOUND  02/23/2012   Procedure: IRRIGATION AND DEBRIDEMENT WOUND;  Surgeon: Joyice Faster. [place], MD;  Location: [place];  Service: General;  Laterality: N/A;  . IR DIALY SHUNT INTRO [place] W/IMG LEFT Left 01/21/2020  . IR FLUORO GUIDE CV LINE RIGHT  01/21/2020  . IR REMOVAL TUN CV CATH W/O FL  01/07/2020  . IR US GUIDE VASC ACCESS LEFT  01/21/2020  . IR US GUIDE VASC ACCESS RIGHT  01/21/2020  . LAPAROTOMY   02/16/2012   Procedure: EXPLORATORY LAPAROTOMY;  Surgeon: Zenovia Jarred, MD;  Location: [place];  Service: General;  Laterality: N/A;  Exploratory Laparotomy with resection of anastomosis  . LAPAROTOMY  02/18/2012   Procedure: EXPLORATORY LAPAROTOMY;  Surgeon: Odis Hollingshead, MD;  Location: [place];  Service: General;  Laterality: N/A;  exploratory laparotomy  change of abdominal vac dressing  . LAPAROTOMY  02/20/2012   Procedure: EXPLORATORY LAPAROTOMY;  Surgeon: Odis Hollingshead, MD;  Location: [place];  Service: General;  Laterality: N/A;  . LAPAROTOMY  02/23/2012   Procedure: EXPLORATORY LAPAROTOMY;  Surgeon: Joyice Faster. [place], MD;  Location: [place];  Service: General;  Laterality: N/A;  Irrigation and Debridement of abdominal wound with wound vac change with partial closure  . LAPAROTOMY  02/26/2012   Procedure: EXPLORATORY LAPAROTOMY;  Surgeon: Imogene Burn. [name], MD;  Location: [place];  Service: General;  Laterality: N/A;     . PARTIAL COLECTOMY  02/06/2012  . PARTIAL COLECTOMY  02/06/2012   Procedure: PARTIAL COLECTOMY;  Surgeon: Imogene Burn. [name], MD;  Location: [place];  Service: General;  Laterality: N/A;  right partial  colectomy  . POSTERIOR CERVICAL FUSION/FORAMINOTOMY N/A 01/06/2020   Procedure: CERVICAL FOUR- THORACIC TWO LAMINECTOMY AND FUSION;  Surgeon: Consuella Lose, MD;  Location: [place];  Service: Neurosurgery;  Laterality: N/A;  CERVICAL FOUR- THORACIC TWO LAMINECTOMY AND FUSION  . RESECTION SMALL BOWEL / CLOSURE ILEOSTOMY  402/2014   Dr [name]  . VACUUM ASSISTED CLOSURE CHANGE  02/20/2012   Procedure: ABDOMINAL VACUUM ASSISTED CLOSURE CHANGE;  Surgeon: Odis Hollingshead, MD;  Location: [place];  Service: General;;  . VACUUM ASSISTED CLOSURE CHANGE  02/23/2012   Procedure: ABDOMINAL VACUUM ASSISTED CLOSURE CHANGE;  Surgeon: Joyice Faster. Cornett, MD;  Location: [place];  Service: General;  Laterality: N/A;     reports that he has been smoking cigarettes. He has a 15.00 pack-year smoking  history. He has never used smokeless tobacco. He reports current alcohol use. He reports current drug use. Drug: Cocaine.  No Known Allergies  Family History  Problem Relation Age of Onset  . Diabetes Father   . Heart disease Father   . Hypertension Mother   . Diabetes Sister   . Heart disease Sister   . Colon cancer Neg Hx     Prior to Admission medications   Medication Sig Start Date End Date Taking? Authorizing Provider  amLODipine (NORVASC) 10 MG tablet Take 1 tablet (10 mg total) by mouth at bedtime. 01/24/20 02/23/20  [place] Donnamarie Poag, DO  atorvastatin (LIPITOR) 20 MG tablet Take 1 tablet (20 mg total) by mouth at bedtime. Patient taking differently: Take 20 mg by mouth every other day.  01/24/20 02/23/20  [place] Eric J, DO  calcitRIOL (ROCALTROL) 0.25 MCG capsule Take 5 capsules (1.25 mcg total) by mouth 3 (three) times a week. 01/24/20 02/23/20  [place] Donnamarie Poag, DO  clonazePAM (KLONOPIN) 0.5 MG tablet Take 1 tablet (0.5 mg total) by mouth 2 (two) times daily as needed (Anxiety/agitation). 01/24/20 02/23/20  [place] Donnamarie Poag, DO  Darbepoetin Alfa (ARANESP) 100 MCG/0.5ML SOSY injection Inject 0.5 mLs (100 mcg total) into the vein every Monday with hemodialysis. 01/24/20   [place] Eric J, DO  insulin detemir (LEVEMIR) 100 UNIT/ML injection Inject 0.11 mLs (11 Units total) into the skin 2 (two) times daily. Patient taking differently: Inject 22 Units into the skin daily.  01/24/20 02/23/20  [place] Eric J, DO  metoprolol tartrate (LOPRESSOR) 25 MG tablet Take 1 tablet (25 mg total) by mouth 2 (two) times daily. Patient not taking: Reported on 01/28/2020 01/24/20 02/23/20  [place] Eric J, DO  multivitamin (RENA-VIT) TABS tablet Take 1 tablet by mouth at bedtime. 01/24/20 02/23/20  [place] Donnamarie Poag, DO  QUEtiapine (SEROQUEL) 25 MG tablet Take 0.5 tablets (12.5 mg total) by mouth at bedtime. Patient not taking: Reported on 01/28/2020 01/24/20 02/23/20  [place] Eric J, DO  rifampin (RIFADIN) 300 MG  capsule Take 1 capsule (300 mg total) by mouth every 12 (twelve) hours. 1/61/09 60/4/54  Delora Fuel, MD  sevelamer carbonate (RENVELA) 800 MG tablet Take 2 tablets (1,600 mg total) by mouth 3 (three) times daily with meals. Patient not taking: Reported on 01/28/2020 01/24/20 02/23/20  [place] Eric J, [place]    Physical Exam: Vitals:   02/20/20 1338 02/20/20 1400 02/20/20 1500 02/20/20 1515  BP:  (!) 123/58 (!) 116/57 (!) 97/51  Pulse:  85  81  Resp:  (!) 26 (!) 0 (!) 24  Temp:      TempSrc:      SpO2:  99%  100%  Weight: 86 kg  Height: 5' 11.5" (1.816 m)       Constitutional: NAD, calm, comfortable, on room air, communicating well Eyes: PERRL, lids and conjunctivae normal ENMT: Mucous membranes are moist. Posterior pharynx clear of any exudate or lesions.Normal dentition.  Neck: On back of neck: Staples noted.  No redness, swelling, tenderness noted on surgery site. Respiratory: clear to auscultation bilaterally, no wheezing, no crackles. Normal respiratory effort. No accessory muscle use.  Cardiovascular: Regular rate and rhythm, no murmurs / rubs / gallops. No extremity edema. 2+ pedal pulses. No carotid bruits.  Abdomen: no tenderness, no masses palpated. No hepatosplenomegaly. Bowel sounds positive.  Musculoskeletal: no clubbing / cyanosis. No joint deformity upper and lower extremities. Good ROM, no contractures. Normal muscle tone.  Skin: no rashes, lesions, ulcers. No induration Neurologic: CN 2-12 grossly intact. Sensation intact, DTR normal. Strength 5/5 in all 4.  Psychiatric: Normal judgment and insight. Alert and oriented x 1. Normal mood.    Labs on Admission: I have personally reviewed following labs and imaging studies  CBC: Recent Labs  Lab 02/20/20 1315  WBC 4.7  NEUTROABS 2.4  HGB 8.5*  HCT 26.7*  MCV 81.7  PLT 774   Basic Metabolic Panel: Recent Labs  Lab 02/20/20 1315  NA 131*  K 6.2*  CL 99  CO2 15*  GLUCOSE 87  BUN 82*  CREATININE 13.66*    CALCIUM 7.9*   GFR: Estimated Creatinine Clearance: 5.6 mL/min (A) (by C-G formula based on SCr of 13.66 mg/dL (H)). Liver Function Tests: Recent Labs  Lab 02/20/20 1315  AST 34  ALT 19  ALKPHOS 81  BILITOT 0.8  PROT 8.0  ALBUMIN 2.5*   No results for input(s): LIPASE, AMYLASE in the last 168 hours. No results for input(s): AMMONIA in the last 168 hours. Coagulation Profile: No results for input(s): INR, PROTIME in the last 168 hours. Cardiac Enzymes: No results for input(s): CKTOTAL, CKMB, CKMBINDEX, TROPONINI in the last 168 hours. BNP (last 3 results) No results for input(s): PROBNP in the last 8760 hours. HbA1C: No results for input(s): HGBA1C in the last 72 hours. CBG: Recent Labs  Lab 02/20/20 1529  GLUCAP 149*   Lipid Profile: Recent Labs    02/20/20 1315  TRIG 108   Thyroid Function Tests: No results for input(s): TSH, T4TOTAL, FREET4, T3FREE, THYROIDAB in the last 72 hours. Anemia Panel: No results for input(s): VITAMINB12, FOLATE, FERRITIN, TIBC, IRON, RETICCTPCT in the last 72 hours. Urine analysis:    Component Value Date/Time   COLORURINE YELLOW 01/04/2020 2120   APPEARANCEUR CLEAR 01/04/2020 2120   LABSPEC 1.012 01/04/2020 2120   PHURINE 5.0 01/04/2020 2120   GLUCOSEU 50 (A) 01/04/2020 2120   GLUCOSEU NEG mg/dL 12/05/2009 1042   HGBUR SMALL (A) 01/04/2020 2120   BILIRUBINUR NEGATIVE 01/04/2020 2120   KETONESUR NEGATIVE 01/04/2020 2120   PROTEINUR >=300 (A) 01/04/2020 2120   UROBILINOGEN 0.2 09/15/2013 1059   NITRITE NEGATIVE 01/04/2020 2120   LEUKOCYTESUR NEGATIVE 01/04/2020 2120    Radiological Exams on Admission: CT Head Wo Contrast  Result Date: 02/20/2020 CLINICAL DATA:  Delirium.  COVID-19 positive.  Recent neck surgery. EXAM: CT HEAD WITHOUT CONTRAST TECHNIQUE: Contiguous axial images were obtained from the base of the skull through the vertex without intravenous contrast. COMPARISON:  January 28, 2020 FINDINGS: Brain: No subdural,  epidural, or subarachnoid hemorrhage. A lacunar infarct in the right thalamus is stable and nonacute. Moderate white matter changes again identified. No acute cortical ischemia or acute infarct noted. No  mass effect or midline shift. Ventricles and sulci are unchanged. Cerebellum, brainstem, and basal cisterns are normal. Vascular: Calcified atherosclerosis in the intracranial carotids. Skull: Normal. Negative for fracture or focal lesion. Sinuses/Orbits: Mild scattered mucosal thickening in the ethmoid air cells and left maxillary sinus. No air-fluid levels. Mastoid air cells and middle ears are well aerated. Other: None. IMPRESSION: 1. No acute intracranial abnormalities. Chronic white matter changes. Chronic right thalamic lacunar infarct. 2. Mild sinus disease as above. Electronically Signed   By: Dorise Bullion III M.D   On: 02/20/2020 14:49   CT Cervical Spine Wo Contrast  Result Date: 02/20/2020 CLINICAL DATA:  COVID positive, recent neck surgery EXAM: CT CERVICAL SPINE WITHOUT CONTRAST TECHNIQUE: Multidetector CT imaging of the cervical spine was performed without intravenous contrast. Multiplanar CT image reconstructions were also generated. COMPARISON:  CT cervical spine, 01/28/2020 FINDINGS: Alignment: Normal. Skull base and vertebrae: No acute fracture. No primary bone lesion. Erosive change of the dens and adjacent transverse process of C1 are not significantly changed compared to prior examination. Soft tissues and spinal canal: Assessment of the soft tissues is somewhat limited by metallic streak artifact and lack of intravenous contrast; within this limitation, no unexpected fluid collection overlying the laminectomy site. Prevertebral soft tissue edema appears reduced compared to prior examination. No visible canal hematoma. Disc levels: Status post posterior laminectomy and fusion of C4 through T1, with moderate disc space height loss and osteophytosis of C6-C7 and C7-T1. Upper chest:  Negative. Other: None. IMPRESSION: 1. Redemonstrated postoperative findings status post posterior laminectomy and fusion of C4 through T1, with moderate disc space height loss and osteophytosis of C6-C7 and C7-T1. 2. Prevertebral soft tissue edema appears reduced compared to prior examination. 3. Postoperative fluid collection overlying laminectomy site does not appear significantly changed compared to prior examination. 4. Assessment of the soft tissues and epidural space is generally limited on noncontrast CT and additionally limited by metallic streak artifact; contrast enhanced MRI is the test of choice for evaluation of edema, fluid collections, the epidural space, and discitis osteomyelitis if clinically suspected in the postoperative setting. Electronically Signed   By: Eddie Candle M.D.   On: 02/20/2020 14:53   DG Chest Port 1 View  Result Date: 02/20/2020 CLINICAL DATA:  COVID EXAM: PORTABLE CHEST 1 VIEW COMPARISON:  01/12/2020 FINDINGS: Interval extubation. Interval placement of large-bore right neck vascular catheter, tip position near the superior cavoatrial junction. The heart size and mediastinal contours are within normal limits. Both lungs are clear. The visualized skeletal structures are unremarkable. IMPRESSION: 1. Interval placement of large-bore right neck vascular catheter, tip position near the superior cavoatrial junction. 2. No acute abnormality of the lungs in AP portable projection. Electronically Signed   By: Eddie Candle M.D.   On: 02/20/2020 13:43    EKG: Independently reviewed.  Sinus rhythm.  No ST elevation or depression noted.  Assessment/Plan Principal Problem:   Acute metabolic encephalopathy Active Problems:   Hyperlipidemia   Hyperkalemia   HYPERTENSION   Anemia of chronic disease   Type 2 diabetes mellitus with renal complication (HCC)   ESRD (end stage renal disease) (HCC)   Epidural abscess   COVID-19 virus infection    Acute metabolic encephalopathy:  Likely in the setting of sepsis secondary to COVID-19 infection & uremia. -Patient presented with fever of 102.4, tachypnea, blood pressure soft, maintaining oxygen saturation on room air, COVID-19 positive, lactic acid: WNL, no leukocytosis.  Reviewed chest x-ray.  CT head negative for acute findings. -Admit  patient on stepdown unit for close monitoring.  On continuous pulse ox.  Start patient on remdesivir as per pharmacy. -Inflammatory markers elevated.  Repeat inflammatory markers tomorrow a.m. -Start on p.o. vitamin, antitussive, albuterol -Check UA, TSH, B12, folate, ammonia level -Monitor vitals closely. -Consult PT/OT/SLP.  We will keep him n.p.o. until he passes bedside swallow evaluation.  Frequent neuro checks.  COVID-19 infection: see above  Epidural abscess: -Status post laminectomy and fusion on 01/06/2020  -Continue vancomycin every Monday/Wednesday/Friday with hemodialysis and rifampin 300 mg p.o. twice daily for 8 weeks (last dose would be on March 03, 2020). -Reviewed CT cervical spine-shows postoperative changes.  MRI cervical spine ordered however patient refused to undergo MRI. -No signs of infection noted on exam.   -Tylenol/oxycodone as needed for pain control.  ESRD on hemodialysis/anion gap metabolic acidosis: -Anion gap of 17. -Nephrology has been consulted and plan is for hemodialysis tonight.  Hyperkalemia: Potassium of 6.2 -Received calcium gluconate, Lokelma, insulin with dextrose in ED. -Nephrology consulted for dialysis tonight.  Anemia of chronic disease: -In the setting of ESRD.  H&H is stable.  Continue to monitor  Hyponatremia: Sodium of 131 -Check TSH, UA, urine sodium and serum osmolality  Hypertension: -Blood pressure soft upon arrival.  Will hold amlodipine, metoprolol for now.  Resume home BP meds once blood pressure is back to baseline  Type 2 diabetes mellitus: -Start on very sensitive sliding scale insulin.  Monitor blood sugar closely.   Check A1c  DVT prophylaxis: Heparin subcutaneous/SCD Code Status: Full code  family Communication: None present at bedside.  Plan of care discussed with patient in length and he verbalized understanding and agreed with it.  I talked to patient's daughter Debroah Baller over the phone and discussed plan of care and she verbalized understanding.  Disposition Plan: Rehab/nursing home in 3 to 4 days Consults called: Nephrology by EDP, consult case manager for nursing home placement. Admission status: Inpatient   Mckinley Jewel MD [facility]  If 7PM-7AM, please contact night-coverage [email or web address] Password [facility]  02/20/2020, 4:32 PM

## 2020-02-21 ENCOUNTER — Inpatient Hospital Stay (HOSPITAL_COMMUNITY): Payer: No Typology Code available for payment source

## 2020-02-21 DIAGNOSIS — G062 Extradural and subdural abscess, unspecified: Secondary | ICD-10-CM | POA: Diagnosis not present

## 2020-02-21 DIAGNOSIS — U071 COVID-19: Secondary | ICD-10-CM | POA: Diagnosis not present

## 2020-02-21 DIAGNOSIS — D638 Anemia in other chronic diseases classified elsewhere: Secondary | ICD-10-CM | POA: Diagnosis not present

## 2020-02-21 DIAGNOSIS — G9341 Metabolic encephalopathy: Secondary | ICD-10-CM | POA: Diagnosis not present

## 2020-02-21 DIAGNOSIS — I10 Essential (primary) hypertension: Secondary | ICD-10-CM

## 2020-02-21 LAB — CBC WITH DIFFERENTIAL/PLATELET
Abs Immature Granulocytes: 0.02 10*3/uL (ref 0.00–0.07)
Basophils Absolute: 0 10*3/uL (ref 0.0–0.1)
Basophils Relative: 0 %
Eosinophils Absolute: 0 10*3/uL (ref 0.0–0.5)
Eosinophils Relative: 0 %
HCT: 24.8 % — ABNORMAL LOW (ref 39.0–52.0)
Hemoglobin: 8 g/dL — ABNORMAL LOW (ref 13.0–17.0)
Immature Granulocytes: 0 %
Lymphocytes Relative: 25 %
Lymphs Abs: 1.3 10*3/uL (ref 0.7–4.0)
MCH: 25.1 pg — ABNORMAL LOW (ref 26.0–34.0)
MCHC: 32.3 g/dL (ref 30.0–36.0)
MCV: 77.7 fL — ABNORMAL LOW (ref 80.0–100.0)
Monocytes Absolute: 0.3 10*3/uL (ref 0.1–1.0)
Monocytes Relative: 5 %
Neutro Abs: 3.5 10*3/uL (ref 1.7–7.7)
Neutrophils Relative %: 70 %
Platelets: 201 10*3/uL (ref 150–400)
RBC: 3.19 MIL/uL — ABNORMAL LOW (ref 4.22–5.81)
RDW: 17.8 % — ABNORMAL HIGH (ref 11.5–15.5)
WBC: 5.1 10*3/uL (ref 4.0–10.5)
nRBC: 0 % (ref 0.0–0.2)

## 2020-02-21 LAB — GLUCOSE, CAPILLARY
Glucose-Capillary: 65 mg/dL — ABNORMAL LOW (ref 70–99)
Glucose-Capillary: 54 mg/dL — ABNORMAL LOW (ref 70–99)
Glucose-Capillary: 66 mg/dL — ABNORMAL LOW (ref 70–99)
Glucose-Capillary: 182 mg/dL — ABNORMAL HIGH (ref 70–99)
Glucose-Capillary: 184 mg/dL — ABNORMAL HIGH (ref 70–99)
Glucose-Capillary: 163 mg/dL — ABNORMAL HIGH (ref 70–99)
Glucose-Capillary: 118 mg/dL — ABNORMAL HIGH (ref 70–99)
Glucose-Capillary: 131 mg/dL — ABNORMAL HIGH (ref 70–99)
Glucose-Capillary: 113 mg/dL — ABNORMAL HIGH (ref 70–99)

## 2020-02-21 LAB — COMPREHENSIVE METABOLIC PANEL
ALT: 18 U/L (ref 0–44)
AST: 33 U/L (ref 15–41)
Albumin: 2.5 g/dL — ABNORMAL LOW (ref 3.5–5.0)
Alkaline Phosphatase: 80 U/L (ref 38–126)
Anion gap: 19 — ABNORMAL HIGH (ref 5–15)
BUN: 93 mg/dL — ABNORMAL HIGH (ref 8–23)
CO2: 13 mmol/L — ABNORMAL LOW (ref 22–32)
Calcium: 7.8 mg/dL — ABNORMAL LOW (ref 8.9–10.3)
Chloride: 102 mmol/L (ref 98–111)
Creatinine, Ser: 15.31 mg/dL — ABNORMAL HIGH (ref 0.61–1.24)
GFR calc Af Amer: 3 mL/min — ABNORMAL LOW (ref >60)
GFR calc non Af Amer: 3 mL/min — ABNORMAL LOW (ref >60)
Glucose, Bld: 93 mg/dL (ref 70–99)
Potassium: 5.4 mmol/L — ABNORMAL HIGH (ref 3.5–5.1)
Sodium: 134 mmol/L — ABNORMAL LOW (ref 135–145)
Total Bilirubin: 0.8 mg/dL (ref 0.3–1.2)
Total Protein: 7.9 g/dL (ref 6.5–8.1)

## 2020-02-21 LAB — C-REACTIVE PROTEIN
CRP: 9.3 mg/dL — ABNORMAL HIGH (ref <1.0)
CRP: 9.2 mg/dL — ABNORMAL HIGH (ref <1.0)

## 2020-02-21 LAB — CREATININE, SERUM
Creatinine, Ser: 14.4 mg/dL — ABNORMAL HIGH (ref 0.61–1.24)
GFR calc Af Amer: 4 mL/min — ABNORMAL LOW (ref >60)
GFR calc non Af Amer: 3 mL/min — ABNORMAL LOW (ref >60)

## 2020-02-21 LAB — CBC
HCT: 25 % — ABNORMAL LOW (ref 39.0–52.0)
Hemoglobin: 7.9 g/dL — ABNORMAL LOW (ref 13.0–17.0)
MCH: 25.3 pg — ABNORMAL LOW (ref 26.0–34.0)
MCHC: 31.6 g/dL (ref 30.0–36.0)
MCV: 80.1 fL (ref 80.0–100.0)
Platelets: 175 10*3/uL (ref 150–400)
RBC: 3.12 MIL/uL — ABNORMAL LOW (ref 4.22–5.81)
RDW: 17.9 % — ABNORMAL HIGH (ref 11.5–15.5)
WBC: 4.5 10*3/uL (ref 4.0–10.5)
nRBC: 0 % (ref 0.0–0.2)

## 2020-02-21 LAB — FERRITIN
Ferritin: 794 ng/mL — ABNORMAL HIGH (ref 24–336)
Ferritin: 753 ng/mL — ABNORMAL HIGH (ref 24–336)

## 2020-02-21 LAB — PROTIME-INR
INR: 1.8 — ABNORMAL HIGH (ref 0.8–1.2)
Prothrombin Time: 20.1 seconds — ABNORMAL HIGH (ref 11.4–15.2)

## 2020-02-21 LAB — URIC ACID: Uric Acid, Serum: 10.3 mg/dL — ABNORMAL HIGH (ref 3.7–8.6)

## 2020-02-21 LAB — PROCALCITONIN: Procalcitonin: 0.8 ng/mL

## 2020-02-21 LAB — OSMOLALITY: Osmolality: 311 mOsm/kg — ABNORMAL HIGH (ref 275–295)

## 2020-02-21 LAB — MAGNESIUM
Magnesium: 2 mg/dL (ref 1.7–2.4)
Magnesium: 2.1 mg/dL (ref 1.7–2.4)

## 2020-02-21 LAB — VITAMIN B12: Vitamin B-12: 489 pg/mL (ref 180–914)

## 2020-02-21 LAB — MRSA PCR SCREENING: MRSA by PCR: NEGATIVE

## 2020-02-21 LAB — TSH: TSH: 3.664 u[IU]/mL (ref 0.350–4.500)

## 2020-02-21 LAB — TROPONIN I (HIGH SENSITIVITY): Troponin I (High Sensitivity): 75 ng/L — ABNORMAL HIGH (ref <18)

## 2020-02-21 LAB — PHOSPHORUS
Phosphorus: 10.4 mg/dL — ABNORMAL HIGH (ref 2.5–4.6)
Phosphorus: 9.9 mg/dL — ABNORMAL HIGH (ref 2.5–4.6)

## 2020-02-21 LAB — AMMONIA: Ammonia: 29 umol/L (ref 9–35)

## 2020-02-21 LAB — SEDIMENTATION RATE: Sed Rate: 34 mm/hr — ABNORMAL HIGH (ref 0–16)

## 2020-02-21 LAB — LACTIC ACID, PLASMA: Lactic Acid, Venous: 1.8 mmol/L (ref 0.5–1.9)

## 2020-02-21 LAB — CORTISOL-AM, BLOOD: Cortisol - AM: 19.8 ug/dL (ref 6.7–22.6)

## 2020-02-21 LAB — D-DIMER, QUANTITATIVE: D-Dimer, Quant: 1.96 ug/mL-FEU — ABNORMAL HIGH (ref 0.00–0.50)

## 2020-02-21 LAB — HIV ANTIBODY (ROUTINE TESTING W REFLEX): HIV Screen 4th Generation wRfx: NONREACTIVE

## 2020-02-21 LAB — FOLATE: Folate: 13.7 ng/mL (ref >5.9)

## 2020-02-21 IMAGING — DX DG CHEST 1V PORT SAME DAY
1 series · 1 of 1 positions shown · non-contrast
Comparison: [DATE]

CLINICAL DATA: Shortness of breath, COVID

EXAM:
PORTABLE CHEST 1 VIEW

[chest ap]
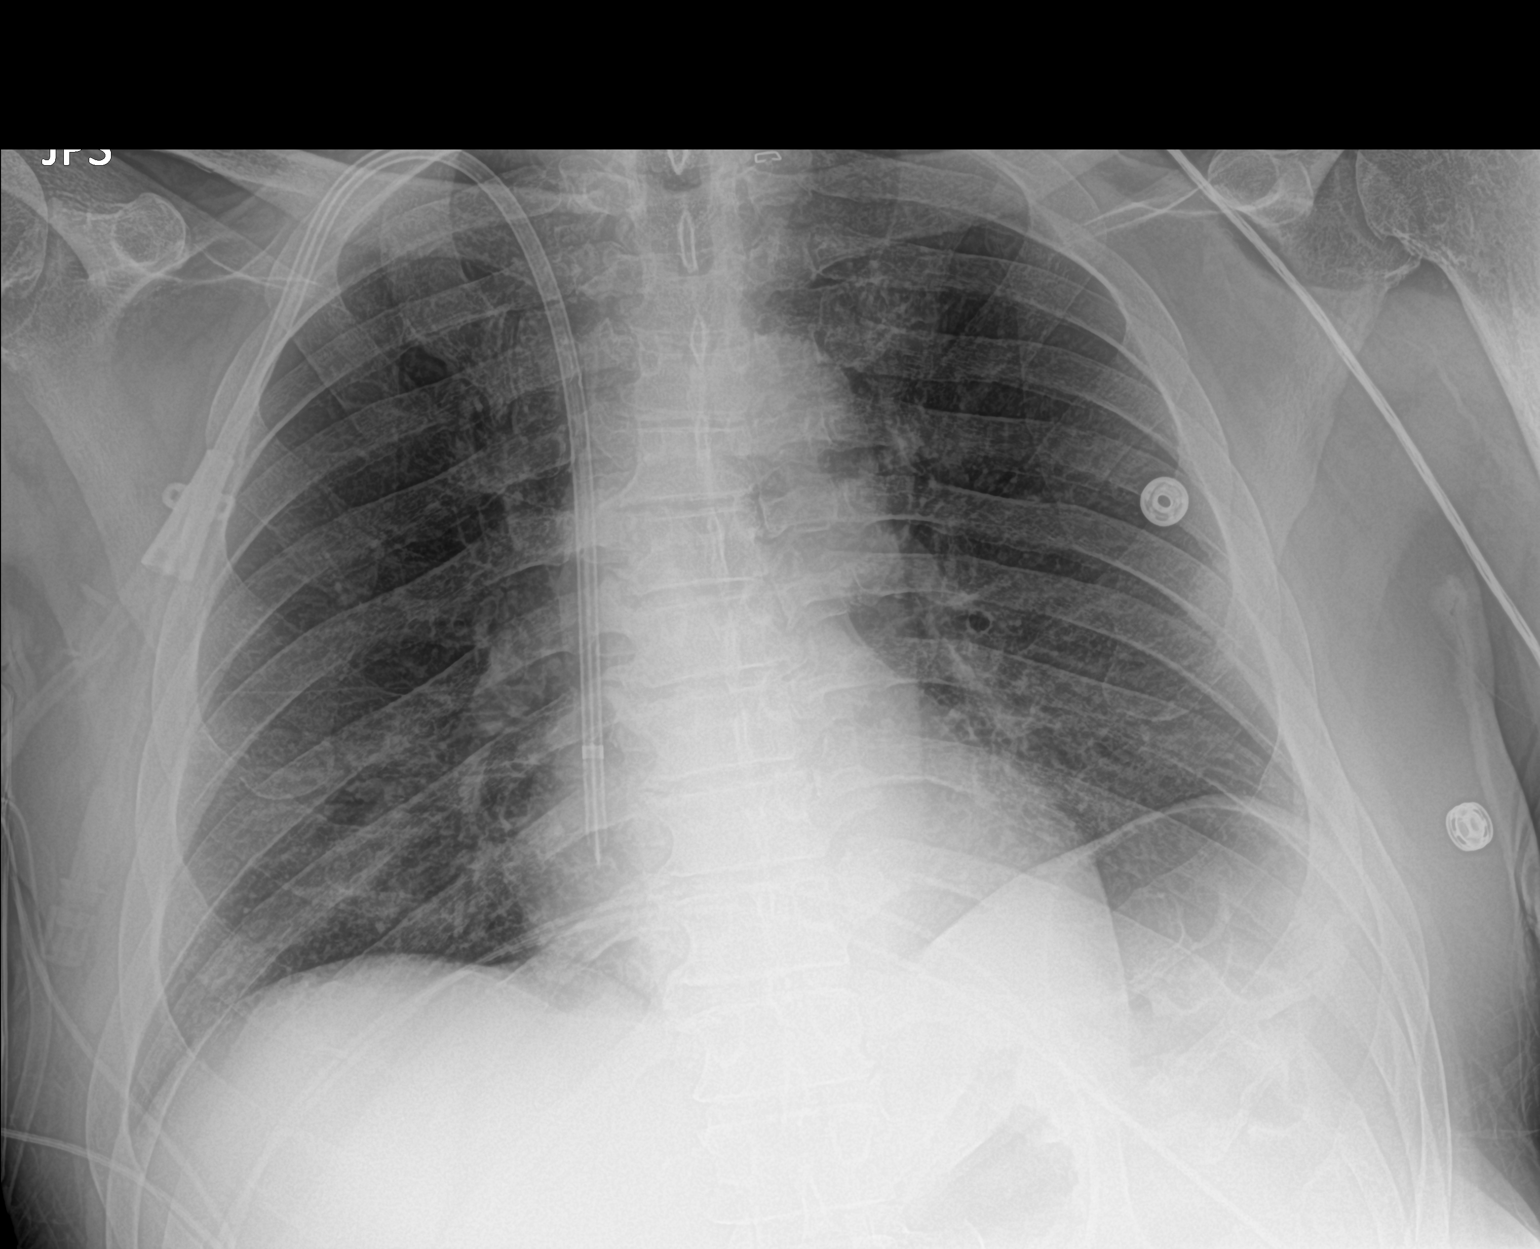

[1 of 1 positions shown; findings below may reference images not displayed]

FINDINGS: Right dialysis catheter remains in place, unchanged. Heart is normal
size. No confluent airspace opacities or effusions. No acute bony
abnormality.
IMPRESSION: No acute cardiopulmonary disease.

## 2020-02-21 MED ORDER — MIDODRINE HCL 5 MG PO TABS
ORAL_TABLET | ORAL | Status: AC
  Filled 2020-02-21: qty 2

## 2020-02-21 MED ORDER — LIDOCAINE-PRILOCAINE 2.5-2.5 % EX CREA: 1.0000 "application " | TOPICAL_CREAM | CUTANEOUS | Status: DC | PRN

## 2020-02-21 MED ORDER — INSULIN ASPART 100 UNIT/ML ~~LOC~~ SOLN: 0.0000 [IU] | SUBCUTANEOUS | Status: DC

## 2020-02-21 MED ORDER — PANTOPRAZOLE SODIUM 40 MG PO TBEC
40.0000 mg | DELAYED_RELEASE_TABLET | Freq: Every day | ORAL | Status: DC
  Administered 2020-02-21 – 2020-02-24 (×4): 40 mg via ORAL
  Filled 2020-02-21 (×4): qty 1

## 2020-02-21 MED ORDER — SODIUM CHLORIDE 0.9 % IV SOLN
INTRAVENOUS | Status: DC | PRN
  Administered 2020-02-21: 250 mL via INTRAVENOUS

## 2020-02-21 MED ORDER — DEXTROSE 50 % IV SOLN
INTRAVENOUS | Status: AC
  Administered 2020-02-21: 25 mL
  Filled 2020-02-21: qty 50

## 2020-02-21 MED ORDER — LIDOCAINE HCL (PF) 1 % IJ SOLN
5.0000 mL | INTRAMUSCULAR | Status: DC | PRN
  Filled 2020-02-21: qty 5

## 2020-02-21 MED ORDER — ORAL CARE MOUTH RINSE
15.0000 mL | Freq: Two times a day (BID) | OROMUCOSAL | Status: DC
  Administered 2020-02-21 (×2): 15 mL via OROMUCOSAL

## 2020-02-21 MED ORDER — SODIUM ZIRCONIUM CYCLOSILICATE 10 G PO PACK
10.0000 g | PACK | Freq: Three times a day (TID) | ORAL | Status: DC
  Administered 2020-02-21 – 2020-02-22 (×3): 10 g via ORAL
  Filled 2020-02-21 (×4): qty 1

## 2020-02-21 MED ORDER — PENTAFLUOROPROP-TETRAFLUOROETH EX AERO
1.0000 "application " | INHALATION_SPRAY | CUTANEOUS | Status: DC | PRN
  Filled 2020-02-21: qty 116

## 2020-02-21 MED ORDER — HEPARIN SODIUM (PORCINE) 1000 UNIT/ML DIALYSIS
1000.0000 [IU] | INTRAMUSCULAR | Status: DC | PRN
  Administered 2020-02-23: 4000 [IU] via INTRAVENOUS_CENTRAL
  Filled 2020-02-21 (×5): qty 1

## 2020-02-21 MED ORDER — QUETIAPINE FUMARATE 25 MG PO TABS
12.5000 mg | ORAL_TABLET | Freq: Every day | ORAL | Status: DC
  Administered 2020-02-21: 12.5 mg via ORAL
  Filled 2020-02-21 (×2): qty 1

## 2020-02-21 MED ORDER — VANCOMYCIN HCL IN DEXTROSE 1-5 GM/200ML-% IV SOLN
INTRAVENOUS | Status: AC
  Filled 2020-02-21: qty 200

## 2020-02-21 MED ORDER — SODIUM CHLORIDE 0.9 % IV SOLN: 100.0000 mL | INTRAVENOUS | Status: DC | PRN

## 2020-02-21 MED ORDER — ALTEPLASE 2 MG IJ SOLR: 2.0000 mg | Freq: Once | INTRAMUSCULAR | Status: DC | PRN

## 2020-02-21 MED ORDER — CLONAZEPAM 0.5 MG PO TABS
0.5000 mg | ORAL_TABLET | Freq: Two times a day (BID) | ORAL | Status: DC | PRN
  Administered 2020-02-21 – 2020-02-22 (×2): 0.5 mg via ORAL
  Filled 2020-02-21 (×2): qty 1

## 2020-02-21 MED ORDER — MIDODRINE HCL 5 MG PO TABS
10.0000 mg | ORAL_TABLET | Freq: Three times a day (TID) | ORAL | Status: DC
  Administered 2020-02-21 – 2020-02-23 (×5): 10 mg via ORAL
  Filled 2020-02-21 (×7): qty 2

## 2020-02-21 MED ORDER — DEXAMETHASONE SODIUM PHOSPHATE 10 MG/ML IJ SOLN
6.0000 mg | INTRAMUSCULAR | Status: DC
  Administered 2020-02-21 – 2020-02-23 (×3): 6 mg via INTRAVENOUS
  Filled 2020-02-21 (×3): qty 1

## 2020-02-21 NOTE — ED Notes (Addendum)
CBG dropped to 63,  Pt continues to have loose stool, pt changed on clean gown and new lines applied to the bed,juice and [place] sandwich given. Pt continues to pull monitor lines out of him.

## 2020-02-21 NOTE — Progress Notes (Signed)
[facility] Progress Note  Subjective: seen on HD on 5C.  Pt confused , able to respond though to some extent.  No c/o's.    Vitals:   02/21/20 1600 02/21/20 1615 02/21/20 1630 02/21/20 1643  BP: (!) 107/55 (!) 112/53 (!) 80/57 (!) 102/58  Pulse: 87 86 89 87  Resp:      Temp:      TempSrc:      SpO2:      Weight:      Height:        Exam:   alert, nad , chron ill appearing  no jvd  Chest cta bilat  Cor reg no RG  Abd soft ntnd no ascites  GU condom cath   Rectal tube in place   Ext no LE edema   Groggy, confused, awakens and responds but disoriented     OP HD: [facility] MWF   4h 5min  87kg  2/2 bath  TDC / L AVF  Hep none  -Mircera 150 mcg IV q 2 weeks (last dose 02/09/2020) -Venofer 100 mg IV X 8 doses (not started yet) -Calcitriol 1.5 mcg PO TIW  Assessment/ Plan: 1. Hyperkalemia/Uremia in setting of truncated HD treatments: did not get HD Sunday night d/t miscommunication. Is on HD now. K+ down today 5.4 w/ medical Rx.  2. AMS - combination missed/ shortened HD causing uremia +/- medication side effects, fevers/ sepsis/ covid. Head CT neg.  Have d/w pmd.   3. COVID 19-recent diagnosis. Tested positive at [facility] 09/18 and again here. CXR unremarkable. Remdesivir per protocol. Per primary 4. 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 Not sure is he has followed up with ID as ordered. 5.  ESRD - MWF has been using AVF, not sure if he is using consistently. Per HD notes, some issues with cannulation. Continue using AVF here, 16g needles today.  6.  Hypertension/volume-No evidence of volume overload by exam. BP lower than usual. Hold home BP meds until BP stabilizes. Minimal UF with HD today. Left under EDW last treatment. No vol ^^ on patient today.  7.  Anemia  - HGB 8.5. ESA due this week. Will order to be given tonight. Hold OP Fe load.  8.  Metabolic bone  disease -Continue binders, VDRA 9.  Nutrition -NPO at present.  10.  DM-per primary     Kelly Splinter 02/21/2020, 5:28 PM   Recent Labs  Lab 02/20/20 1315 02/20/20 1315 02/21/20 0017 02/21/20 0434  K 6.2*  --   --  5.4*  BUN 82*  --   --  93*  CREATININE 13.66*   < > 14.40* 15.31*  CALCIUM 7.9*  --   --  7.8*  PHOS  --   --  9.9* 10.4*  HGB 8.5*   < > 7.9* 8.0*   < > = values in this interval not displayed.   Inpatient medications: . calcitRIOL  1.5 mcg Oral Q M,W,F-HD  . Chlorhexidine Gluconate Cloth  6 each Topical Q0600  . dexamethasone (DECADRON) injection  6 mg Intravenous Q24H  . feeding supplement (PROSource TF)  45 mL Per Tube BID  . heparin  5,000 Units Subcutaneous Q8H  . insulin aspart  0-6 Units Subcutaneous TID WC  . mouth rinse  15 mL Mouth Rinse BID  . midodrine  10 mg Oral TID WC  . multivitamin  1 tablet Oral QHS  . pantoprazole  40  mg Oral Q1200  . QUEtiapine  12.5 mg Oral QHS  . rifampin  300 mg Oral Q12H  . sevelamer carbonate  1,600 mg Oral TID WC  . sodium zirconium cyclosilicate  10 g Oral TID  . zinc sulfate  220 mg Oral Daily   . sodium chloride    . sodium chloride    . remdesivir 100 mg in NS 100 mL 100 mg (02/21/20 0858)  . vancomycin    . vancomycin     sodium chloride, sodium chloride, acetaminophen **OR** acetaminophen, alteplase, chlorpheniramine-HYDROcodone, clonazePAM, guaiFENesin-dextromethorphan, heparin, lidocaine (PF), lidocaine-prilocaine, ondansetron **OR** ondansetron (ZOFRAN) IV, oxyCODONE, pentafluoroprop-tetrafluoroeth

## 2020-02-21 NOTE — Evaluation (Signed)
Clinical/Bedside Swallow Evaluation Patient Details  Name: [name]. MRN: [id number] Date of Birth: 19-Oct-1950  Today's Date: 02/21/2020 Time: SLP Start Time (ACUTE ONLY): 4403 SLP Stop Time (ACUTE ONLY): 0923 SLP Time Calculation (min) (ACUTE ONLY): 13 min  Past Medical History:  Past Medical History:  Diagnosis Date  . Anemia   . Arthritis    left knee  . Chronic kidney disease    acute renal failure/injury requiring short-term HD 2013  . Colon cancer ([place])   . Colon polyp    11/2011 - Polyps identified, biopsy - invasive adenocarinoma  . DM II (diabetes mellitus, type II), controlled ([place])    type 2 IDDM x 15 years. A1C 1/09 13.7.   Marland Kitchen 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 12/25/2011  . GERD (gastroesophageal reflux disease)   . Heart murmur   . 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 Hyperlipidemia   . Hypertension    for a few years and resolved in 2013/2014.  . Sleep apnea    does not wear CPAP now  . Wears glasses    Past Surgical History:  Past Surgical History:  Procedure Laterality Date  . APPLICATION OF WOUND VAC  02/16/2012   Procedure: APPLICATION OF WOUND VAC;  Surgeon: Zenovia Jarred, MD;  Location: [place];  Service: General;  Laterality: N/A;  . APPLICATION OF WOUND VAC  02/26/2012   Procedure: APPLICATION OF WOUND VAC;  Surgeon: Imogene Burn. [name], MD;  Location: [place];  Service: General;  Laterality: N/A;  . AV FISTULA PLACEMENT Left 07/29/2019   Procedure: ARTERIOVENOUS (AV) FISTULA CREATION;  Surgeon: Marty Heck, MD;  Location: [place];  Service: Vascular;  Laterality: Left;  . COLONOSCOPY    . COLONOSCOPY  02/06/2012   Procedure: COLONOSCOPY;  Surgeon: Milus Banister, MD;  Location: [place];  Service: Endoscopy;;  . COLONOSCOPY WITH PROPOFOL N/A 06/10/2019   Procedure: COLONOSCOPY WITH PROPOFOL;  Surgeon: Milus Banister, MD;  Location: [facility];  Service: Endoscopy;  Laterality: N/A;  . COLOSTOMY REVERSAL    . DRESSING CHANGE  UNDER ANESTHESIA  02/18/2012   Procedure: DRESSING CHANGE UNDER ANESTHESIA;  Surgeon: Odis Hollingshead, MD;  Location: [place];  Service: General;  Laterality: N/A;  . ESOPHAGOGASTRODUODENOSCOPY (EGD) WITH PROPOFOL N/A 06/10/2019   Procedure: ESOPHAGOGASTRODUODENOSCOPY (EGD) WITH PROPOFOL;  Surgeon: Milus Banister, MD;  Location: [facility];  Service: Endoscopy;  Laterality: N/A;  . GANGLION CYST EXCISION  20 YRS AGO   RT ARM  . ILEOSTOMY  02/16/2012   Procedure: ILEOSTOMY;  Surgeon: Zenovia Jarred, MD;  Location: [place];  Service: General;  Laterality: N/A;  . ILEOSTOMY CLOSURE N/A 09/02/2012   Procedure: ILEOSTOMY REVERSAL;  Surgeon: Imogene Burn. [name], MD;  Location: [place];  Service: General;  Laterality: N/A;  . ILIOSTOMY    . INCISION AND DRAINAGE OF WOUND  02/23/2012   Procedure: IRRIGATION AND DEBRIDEMENT WOUND;  Surgeon: Joyice Faster. Cornett, MD;  Location: [place];  Service: General;  Laterality: N/A;  . IR DIALY SHUNT INTRO [place] W/IMG LEFT Left 01/21/2020  . IR FLUORO GUIDE CV LINE RIGHT  01/21/2020  . IR REMOVAL TUN CV CATH W/O FL  01/07/2020  . IR US GUIDE VASC ACCESS LEFT  01/21/2020  . IR US GUIDE VASC ACCESS RIGHT  01/21/2020  . LAPAROTOMY  02/16/2012   Procedure: EXPLORATORY LAPAROTOMY;  Surgeon: Zenovia Jarred, MD;  Location: [place];  Service: General;  Laterality: N/A;  Exploratory  Laparotomy with resection of anastomosis  . LAPAROTOMY  02/18/2012   Procedure: EXPLORATORY LAPAROTOMY;  Surgeon: Odis Hollingshead, MD;  Location: [place];  Service: General;  Laterality: N/A;  exploratory laparotomy  change of abdominal vac dressing  . LAPAROTOMY  02/20/2012   Procedure: EXPLORATORY LAPAROTOMY;  Surgeon: Odis Hollingshead, MD;  Location: [place];  Service: General;  Laterality: N/A;  . LAPAROTOMY  02/23/2012   Procedure: EXPLORATORY LAPAROTOMY;  Surgeon: Joyice Faster. Cornett, MD;  Location: [place];  Service: General;  Laterality: N/A;  Irrigation and Debridement of abdominal wound  with wound vac change with partial closure  . LAPAROTOMY  02/26/2012   Procedure: EXPLORATORY LAPAROTOMY;  Surgeon: Imogene Burn. [name], MD;  Location: [place];  Service: General;  Laterality: N/A;     . PARTIAL COLECTOMY  02/06/2012  . PARTIAL COLECTOMY  02/06/2012   Procedure: PARTIAL COLECTOMY;  Surgeon: Imogene Burn. [name], MD;  Location: [place];  Service: General;  Laterality: N/A;  right partial colectomy  . POSTERIOR CERVICAL FUSION/FORAMINOTOMY N/A 01/06/2020   Procedure: CERVICAL FOUR- THORACIC TWO LAMINECTOMY AND FUSION;  Surgeon: Consuella Lose, MD;  Location: [place];  Service: Neurosurgery;  Laterality: N/A;  CERVICAL FOUR- THORACIC TWO LAMINECTOMY AND FUSION  . RESECTION SMALL BOWEL / CLOSURE ILEOSTOMY  402/2014   Dr [name]  . VACUUM ASSISTED CLOSURE CHANGE  02/20/2012   Procedure: ABDOMINAL VACUUM ASSISTED CLOSURE CHANGE;  Surgeon: Odis Hollingshead, MD;  Location: [place];  Service: General;;  . VACUUM ASSISTED CLOSURE CHANGE  02/23/2012   Procedure: ABDOMINAL VACUUM ASSISTED CLOSURE CHANGE;  Surgeon: Joyice Faster. Cornett, MD;  Location: [place];  Service: General;  Laterality: N/A;   HPI:  Rodney Apelles Jasraj Lappe. is a 69 y.o. male with medical history significant of GERD, hypertension, hyperlipidemia ESRD on hemodialysis, type 2 diabetes mellitus, anemia of chronic disease, colon cancer status post resection, epidural abscess status post laminectomy, fusion on 01/06/2020 and MRSA bacteremia presents to emergency department for evaluation of confusion and COVID-19 positive. CXR No acute abnormality of the lungs in AP portable projection.   Assessment / Plan / Recommendation Clinical Impression  Pt encountered sleepy with reduced endurance for swallow assessement following commands with delays, repetition and tactile/verbal stimulation to maintain awake state. Concerns with airway protection are secondary to his presentation of poor dentition, current lethargy and reduced endurance for efficient  nutritional status. Work of breathing and vitals were stable across various consistencies. Dentition is limited to approximately 5 mandibular and mastication was slow and prolonged compacted by lethargy and deconditioned state. Liquids trials limited to single cup and straw sip water and puree trials consumed only after encouragement provided. Noted minimally audible swallow not concerning via subjective observation. History of GERD and eructation noted. SLP downgraded texture to Dys 2 (fine chopped), continue thin liquids, pills whole, remain upright after meals and assist with feeding. Hopeful texture can be upgraded prior to discharge- will follow.    SLP Visit Diagnosis: Dysphagia, unspecified (R13.10)    Aspiration Risk  Mild aspiration risk    Diet Recommendation Dysphagia 2 (Fine chop);Thin liquid   Liquid Administration via: Cup;Straw Medication Administration: Whole meds with puree Supervision: Full supervision/cueing for compensatory strategies;Staff to assist with self feeding Compensations: Slow rate;Small sips/bites;Lingual sweep for clearance of pocketing Postural Changes: Remain upright for at least 30 minutes after po intake;Seated upright at 90 degrees    Other  Recommendations Oral Care Recommendations: Oral care BID   Follow up Recommendations  (TBD)  Frequency and Duration min 2x/week  2 weeks       Prognosis Prognosis for Safe Diet Advancement: Good      Swallow Study   General HPI: Rodney Apelles Montravious Weigelt. is a 68 y.o. male with medical history significant of GERD, hypertension, hyperlipidemia ESRD on hemodialysis, type 2 diabetes mellitus, anemia of chronic disease, colon cancer status post resection, epidural abscess status post laminectomy, fusion on 01/06/2020 and MRSA bacteremia presents to emergency department for evaluation of confusion and COVID-19 positive. CXR No acute abnormality of the lungs in AP portable projection. Type of Study: Bedside Swallow  Evaluation Previous Swallow Assessment:  (none) Diet Prior to this Study: Regular;Thin liquids Temperature Spikes Noted: No Respiratory Status: Nasal cannula History of Recent Intubation: No Behavior/Cognition: Lethargic/Drowsy;Cooperative;Requires cueing Oral Cavity Assessment: Dry;Other (comment) (darkened lingual tint) Oral Care Completed by SLP: No Oral Cavity - Dentition: Missing dentition (no maxillary dentition, approx 5 anterior mandibular) Vision:  (mostly closed due to lethargy) Self-Feeding Abilities: Needs assist Patient Positioning: Upright in bed Baseline Vocal Quality: Low vocal intensity Volitional Cough: Strong Volitional Swallow: Able to elicit (delayed)    Oral/Motor/Sensory Function Overall Oral Motor/Sensory Function: Within functional limits (decr endurance due to lethargy)   Ice Chips Ice chips: Not tested   Thin Liquid Thin Liquid: Within functional limits Presentation: Cup;Straw Other Comments:  (slightly audible swallow)    Nectar Thick Nectar Thick Liquid: Not tested   Honey Thick Honey Thick Liquid: Not tested   Puree Puree: Within functional limits   Solid     Solid: Impaired Oral Phase Impairments: Poor awareness of bolus;Reduced lingual movement/coordination Oral Phase Functional Implications:  (prolonged mastication)      [name], Orbie Pyo 02/21/2020,9:50 AM  Orbie Pyo Colvin Caroli.Ed Risk analyst [phone] Office [phone]

## 2020-02-21 NOTE — Plan of Care (Signed)
°  Problem: Education: Goal: Knowledge of General Education information will improve Description: Including pain rating scale, medication(s)/side effects and non-pharmacologic comfort measures Outcome: Not Progressing   Problem: Health Behavior/Discharge Planning: Goal: Ability to manage health-related needs will improve Outcome: Not Progressing   Problem: Clinical Measurements: Goal: Ability to maintain clinical measurements within normal limits will improve Outcome: Progressing Goal: Will remain free from infection Outcome: Progressing Goal: Diagnostic test results will improve Outcome: Progressing Goal: Respiratory complications will improve Outcome: Progressing Goal: Cardiovascular complication will be avoided Outcome: Progressing   Problem: Activity: Goal: Risk for activity intolerance will decrease Outcome: Not Progressing   Problem: Nutrition: Goal: Adequate nutrition will be maintained Outcome: Progressing   Problem: Coping: Goal: Level of anxiety will decrease Outcome: Progressing   Problem: Elimination: Goal: Will not experience complications related to bowel motility Outcome: Progressing Goal: Will not experience complications related to urinary retention Outcome: Progressing   Problem: Pain Managment: Goal: General experience of comfort will improve Outcome: Progressing   Problem: Safety: Goal: Ability to remain free from injury will improve Outcome: Progressing   Problem: Skin Integrity: Goal: Risk for impaired skin integrity will decrease Outcome: Not Progressing   Problem: Education: Goal: Knowledge of risk factors and measures for prevention of condition will improve Outcome: Not Progressing   Problem: Coping: Goal: Psychosocial and spiritual needs will be supported Outcome: Progressing   Problem: Respiratory: Goal: Will maintain a patent airway Outcome: Progressing Goal: Complications related to the disease process, condition or treatment  will be avoided or minimized Outcome: Progressing

## 2020-02-21 NOTE — Evaluation (Signed)
Occupational Therapy Evaluation Patient Details Name: [name]. MRN: [id number] DOB: 1951/03/24 Today's Date: 02/21/2020    History of Present Illness 69 y.o. male presents to ED for evaluation of confusion; Tested COVID-19 positive. CXR No acute abnormality of the lungs in AP portable projection. PMH including GERD, HTN, hyperlipidemia ESRD on hemodialysis, type 2 DM, anemia of chronic disease, colon cancer status post resection, epidural abscess s/p laminectomy, cervical fusion on 01/06/2020 and MRSA bacteremia    Clinical Impression   Upon arrival, pt lethargic and supine in bed. BP in supine with slightly HOB elevated 87/50 and with bed flat 99/58. Notified RN. Once in trendelenburg, pt with increased arousal and answering questions; BP 101/54. Pt with incontinence in bed, requiring Min-Mod A +2 for rolling in bed and Total A for peri care. In chair position with upright posture, pt able to perform self feeding task with Min A demonstrating increased arousal. Pt would benefit from further acute OT to facilitate safe dc. Recommend dc to SNF for further OT to optimize safety, independence with ADLs, and return to PLOF.    HR 98. SpO2 100% on 6L.   Orthostatic BPs   Supine with HOB elevated 87/50  Supine and bed flat 99/58  Trendelenburg  101/54  Supine bed flat 103/58  Bed in chair position 90/49      Follow Up Recommendations  SNF    Equipment Recommendations  Other (comment) (Defer to next venue)    Recommendations for Other Services PT consult;Speech consult     Precautions / Restrictions Precautions Precautions: Fall;Other (comment) Precaution Comments: AMS; BP Restrictions Weight Bearing Restrictions: No      Mobility Bed Mobility Overal bed mobility: Needs Assistance Bed Mobility: Rolling Rolling: Mod assist;+2 for safety/equipment;+2 for physical assistance;Min assist         General bed mobility comments: Pt increased cues, pt able to  participate in rolling and perform with Min-Mod A +2  Transfers                 General transfer comment: Defer for safety    Balance                                           ADL either performed or assessed with clinical judgement   ADL Overall ADL's : Needs assistance/impaired Eating/Feeding: Minimal assistance;Bed level Eating/Feeding Details (indicate cue type and reason): Min A for managing cup. Once pt hold cup, able to bring to mouth.  Grooming: Minimal assistance;Bed level;Wash/dry face   Upper Body Bathing: Maximal assistance;Bed level   Lower Body Bathing: Maximal assistance;Bed level   Upper Body Dressing : Maximal assistance;Bed level   Lower Body Dressing: Maximal assistance;Bed level         Toileting - Clothing Manipulation Details (indicate cue type and reason): Total A for peri care after incontience in bed       General ADL Comments: Pt requiring Max A      Vision Baseline Vision/History: Wears glasses Wears Glasses: At all times Patient Visual Report: No change from baseline       Perception     Praxis      Pertinent Vitals/Pain Pain Assessment: Faces Faces Pain Scale: Hurts a little bit Pain Location: Generalized Pain Descriptors / Indicators: Grimacing Pain Intervention(s): Monitored during session;Limited activity within patient's tolerance;Repositioned     Hand Dominance Right  Extremity/Trunk Assessment Upper Extremity Assessment Upper Extremity Assessment: Generalized weakness   Lower Extremity Assessment Lower Extremity Assessment: Defer to PT evaluation       Communication Communication Communication: Other (comment) (lethargic and mubbling)   Cognition Arousal/Alertness: Lethargic Behavior During Therapy: Flat affect Overall Cognitive Status: No family/caregiver present to determine baseline cognitive functioning                                 General Comments: Pt keeping his  eyes closed for majority of session. Noting hypotensive. Pt becoming more alter in trendeleburg and in chair position. Able to engage in self feeding task. When asking pt's birthday, pt stating "christmas eve"   General Comments  HR 98. SpO2 100% on 6L. BP supine HOB elevated  87/50. BP flat 99/58. BP trenelenburg 101/54. Supine 103/58. Upright with bed in chair position 90/49. RN present at end of session    Exercises     Shoulder Instructions      Home Living Family/patient expects to be discharged to:: Private residence                                 Additional Comments: Unsure of pt unable to provide information. Pt reports he lives alone.       Prior Functioning/Environment          Comments: Unsure of pt poor historian with increased lethargy        OT Problem List: Decreased strength;Decreased range of motion;Decreased activity tolerance;Impaired balance (sitting and/or standing);Decreased knowledge of use of DME or AE;Decreased knowledge of precautions;Cardiopulmonary status limiting activity;Pain      OT Treatment/Interventions: Self-care/ADL training;Therapeutic exercise;Energy conservation;DME and/or AE instruction;Therapeutic activities;Patient/family education    OT Goals(Current goals can be found in the care plan section) Acute Rehab OT Goals Patient Stated Goal: Unstated OT Goal Formulation: Patient unable to participate in goal setting Time For Goal Achievement: 03/06/20 Potential to Achieve Goals: Good  OT Frequency: Min 2X/week   Barriers to D/C:            Co-evaluation              AM-PAC OT "6 Clicks" Daily Activity     Outcome Measure Help from another person eating meals?: A Little Help from another person taking care of personal grooming?: A Little Help from another person toileting, which includes using toliet, bedpan, or urinal?: A Lot Help from another person bathing (including washing, rinsing, drying)?: A Lot Help  from another person to put on and taking off regular upper body clothing?: A Lot Help from another person to put on and taking off regular lower body clothing?: A Lot 6 Click Score: 14   End of Session Equipment Utilized During Treatment: Oxygen Nurse Communication: Mobility status;Other (comment) (BP)  Activity Tolerance: Patient limited by fatigue;Patient limited by lethargy Patient left: in bed;with call bell/phone within reach;with bed alarm set;with nursing/sitter in room  OT Visit Diagnosis: Unsteadiness on feet (R26.81);Other abnormalities of gait and mobility (R26.89);Muscle weakness (generalized) (M62.81)                Time: 9390-3009 OT Time Calculation (min): 32 min Charges:  OT General Charges $OT Visit: 1 Visit OT Evaluation $OT Eval Moderate Complexity: 1 Mod OT Treatments $Self Care/Home Management : 8-22 mins  [place], OTR/L Acute Rehab Pager: [phone] Office: [phone]  [place] Footman Syria Kestner 02/21/2020, 10:48 AM

## 2020-02-21 NOTE — Evaluation (Signed)
Physical Therapy Evaluation Patient Details Name: [name]. MRN: [id number] DOB: 12/26/1950 Today's Date: 02/21/2020   History of Present Illness  Pt is a 69 y.o. male with medical history significant of hypertension, hyperlipidemia ESRD on hemodialysis on Monday Wednesday, Friday, type 2 diabetes mellitus, anemia of chronic disease, colon cancer status post resection, epidural abscess status post laminectomy, fusion on 01/06/2020 and MRSA bacteremia presents to emergency department for evaluation of confusion and COVID-19 positive.  He is admitted with metabolic encephalopathy and COVID 19.  Clinical Impression   Pt admitted with above diagnosis. Evaluation was limited due to pt's lethargy, soft bp, and pt getting ready to go to HD soon so unable to transfer OOB.  Additionally, unsure of PLOF.  Pt confused and unable to provide - per chart pt from [facility].  Today he required mod-max A of 2 for rolling and bed mobility.  He did perform long sitting with mod A but fatigued easily.  Required frequent cues to stay awake and for exercise form. Pt currently with functional limitations due to the deficits listed below (see PT Problem List). Pt will benefit from skilled PT to increase their independence and safety with mobility to allow discharge to the venue listed below.       Follow Up Recommendations SNF    Equipment Recommendations  None recommended by PT (from facility)    Recommendations for Other Services       Precautions / Restrictions Precautions Precautions: Fall;Other (comment) Precaution Comments: AMS; BP      Mobility  Bed Mobility Overal bed mobility: Needs Assistance Bed Mobility: Rolling;Supine to Sit Rolling: Mod assist;+2 for safety/equipment;+2 for physical assistance   Supine to sit: Mod assist     General bed mobility comments: Increased time and cues; positioned opposite leg in flexed position to faciliate and encouraged pt to reach;  required max x 2 to scoot up in bed; performed supine to long sit x 2 with mod A  Transfers                 General transfer comment: Defer for safety  Ambulation/Gait                Stairs            Wheelchair Mobility    Modified Rankin (Stroke Patients Only)       Balance Overall balance assessment: Needs assistance Sitting-balance support: Bilateral upper extremity supported Sitting balance-Leahy Scale: Poor Sitting balance - Comments: required assist to maintain long sitting in bed; fatigued easily       Standing balance comment: defered                             Pertinent Vitals/Pain      Home Living Family/patient expects to be discharged to:: Skilled nursing facility                 Additional Comments: Pt reports he lives alone, but per chart pt was at [facility] but family does not feel he is getting proper care and want to change facilities    Prior Function           Comments: Pt poor historian and with lethargy.  Reports able to walk.     Hand Dominance        Extremity/Trunk Assessment   Upper Extremity Assessment Upper Extremity Assessment: Defer to OT evaluation;Generalized weakness    Lower  Extremity Assessment Lower Extremity Assessment: LLE deficits/detail;RLE deficits/detail;Generalized weakness RLE Deficits / Details: Pt demonstrating ROM WFL, but had difficulty with MMT commands.  Did demonstrate at least 3/5 throughout. LLE Deficits / Details: Pt demonstrating ROM WFL, but had difficulty with MMT commands.  Did demonstrate at least 3/5 throughout.    Cervical / Trunk Assessment Cervical / Trunk Assessment: Normal  Communication      Cognition Arousal/Alertness: Lethargic Behavior During Therapy: Flat affect Overall Cognitive Status: No family/caregiver present to determine baseline cognitive functioning Area of Impairment: Orientation                 Orientation Level:  Disoriented to;Place;Time;Situation             General Comments: Pt confused and not able to provide accurate history.  He is very lethargic and falls asleep requiring verbal and tactile cues to remain awake. He was able to follow ~50% of simple commands      General Comments General comments (skin integrity, edema, etc.): Pt's BP in supine 107/63 and in long sitting 93/58 - no c/o lightheadedness but was lethargic.  Pt was on RA with sats 90%    Exercises  1x10 AAROM: ankle pumps, hip abd/add, heel slides, and SAQ all in supine   Assessment/Plan    PT Assessment Patient needs continued PT services  PT Problem List Decreased strength;Decreased mobility;Decreased safety awareness;Decreased coordination;Decreased knowledge of precautions;Decreased activity tolerance;Decreased cognition;Cardiopulmonary status limiting activity;Decreased balance;Decreased knowledge of use of DME       PT Treatment Interventions DME instruction;Therapeutic activities;Gait training;Therapeutic exercise;Patient/family education;Balance training;Functional mobility training    PT Goals (Current goals can be found in the Care Plan section)  Acute Rehab PT Goals Patient Stated Goal: Unstated PT Goal Formulation: Patient unable to participate in goal setting Time For Goal Achievement: 03/06/20 Potential to Achieve Goals: Fair    Frequency Min 2X/week   Barriers to discharge        Co-evaluation               AM-PAC PT "6 Clicks" Mobility  Outcome Measure Help needed turning from your back to your side while in a flat bed without using bedrails?: Total Help needed moving from lying on your back to sitting on the side of a flat bed without using bedrails?: Total Help needed moving to and from a bed to a chair (including a wheelchair)?: Total Help needed standing up from a chair using your arms (e.g., wheelchair or bedside chair)?: Total Help needed to walk in hospital room?: Total Help needed  climbing 3-5 steps with a railing? : Total 6 Click Score: 6    End of Session   Activity Tolerance: Patient limited by lethargy;Other (comment) (leaving for HD) Patient left: in bed;with call bell/phone within reach;with bed alarm set Nurse Communication: Mobility status;Need for lift equipment PT Visit Diagnosis: Other abnormalities of gait and mobility (R26.89);Muscle weakness (generalized) (M62.81)    Time: 9983-3825 PT Time Calculation (min) (ACUTE ONLY): 20 min   Charges:   PT Evaluation $PT Eval Low Complexity: 1 Low          Zuleika Gallus, PT Acute Rehab Services Pager [phone] [facility] [place] 02/21/2020, 4:55 PM

## 2020-02-21 NOTE — Progress Notes (Signed)
Renal Navigator notes patient's positive COVID test result on 02/20/11 and notified patient's OP HD clinic/[facility]. Patient will needs to have HD in isolation for 21 days from positive test. If discharged prior to 21 days, he will treat at his home clinic/[facility] on a TTS scheduled with a seat time of approximately 5:30pm.  Please notify Renal Navigator if transportation to this shift is needed at least 24 hours in advance. Navigator will follow.  Alphonzo Cruise, [place] Renal Navigator [phone]

## 2020-02-21 NOTE — ED Notes (Addendum)
This RN took over  patient at 11pm. When this RN took over patient had not been to dialysis/no blood work done/meds given/ no IV in place. Patient refusing to keep bp cuff/pulse ox/cardiac leads on at this time. When this RN went into patients room, patient was covered In feces. This RN and Harmony NT attempted to clean patient up. Patient refused. This RN tried to explain to the patient that he had a bed ready and needed to be cleaned before going up. Patient stated he didn't care and I can send him up exactly how he was. This RN attempted again to clean patient up and was still getting push back from patient. This RN/NT changed patient's  gown and managed to get a pulse ox on patient in order to get him up stairs. Nurse upstairs made aware of the situation.

## 2020-02-21 NOTE — ED Notes (Signed)
CBG recheck up to 77. Pt is AO to self like on ED arrival no neuro deficit noticed, more orange juice given.

## 2020-02-21 NOTE — Progress Notes (Signed)
PROGRESS NOTE                                                                                                                                                                                                             Patient Demographics:    [name], is a 69 y.o. male, DOB - 03-11-1951, NUU:[id number]  Outpatient Primary MD for the patient is [facility], [facility]   Admit date - 02/20/2020   LOS - 1  Chief Complaint  Patient presents with  . Covid Positive  . AMS       Brief Narrative: Patient is a 69 y.o. male with PMHx of ESRD on HD MWF, DM-2, MRSA bacteremia with epidural abscess s/p C4-T2 laminectomy/fusion on 01/06/2020-resident of SNF-apparently tested positive for COVID-19 on 9/18-subsequently brought to the ED on 9/19 for worsening confusion (more than baseline)-fever-thought to have acute metabolic encephalopathy from COVID-19 infection and admitted to the hospitalist service.  See below for further details.  COVID-19 vaccinated status: Unvaccinated  Significant Events: 8/3-8/23>> hospitalized for epidural abscess/MRSA bacteremia-underwent laminectomy-briefly intubated due to altered mental status.  Discharged to SNF 9/18>> COVID-19 positive at SNF 9/19>> Admit to [facility] for fever/confusion  Significant studies: 9/19>>Chest x-ray: No acute abnormality in the lung. 9/19>> CT head: No acute intracranial abnormality 9/19>> CT C-spine: Postoperative findings of posterior laminectomy/fusion C4-T1, prevertebral soft tissue edema appears reduced, postoperative fluid collection overlies laminectomy site does not appear significantly changed compared to prior examination. 9/20>> MRI C-spine: Attempted but due to AMS-not completed  COVID-19 medications: Remdesivir: 9/19>>  Antibiotics: IV vancomycin/rifampin plan to continue through October 1  Microbiology data: 9/20 >>blood culture:  Pending  Procedures: None  Consults: Nephrology  DVT prophylaxis: heparin injection 5,000 Units Start: 02/20/20 1645 SCDs Start: 02/20/20 1628   Subjective:    [name] today was lying comfortably in bed-able to follow simple commands-able to answer simple questions.  On 3 L of oxygen this morning.   Assessment  & Plan :   Acute metabolic encephalopathy: Suspect this is secondary to sepsis physiology and possible uremia (BUN 82 on initial presentation-has not completed HD treatments in the past 1 week)-CT head negative for acute abnormalities-mentation seems to have improved-he is able to follow simple commands-answer simple questions-plans are to continue supportive care.  Sepsis: Likely secondary to COVID-19-not sure if patient has ongoing MRSA  bacteremia-await blood cultures/MRI C-spine.  Remains on IV vancomycin and rifampin.  Acute Hypoxic Resp Failure due to Covid 19 Viral pneumonia: Please seem to have COVID-19 pneumonia-as he is mildly hypoxic-add steroids-continue Remdesivir-repeat chest x-ray today.  Slowly attempt to titrate down FiO2.  Fever: afebrile O2 requirements:  SpO2: 100 % O2 Flow Rate (L/min): 4 L/min   COVID-19 Labs: Recent Labs    02/20/20 1315 02/21/20 0018 02/21/20 0434  DDIMER 2.06*  --  1.96*  FERRITIN  --  794* 753*  LDH 241*  --   --   CRP  --  9.3* 9.2*    No results found for: BNP  Recent Labs  Lab 02/20/20 1315 02/21/20 0434  PROCALCITON 0.64 0.80    Lab Results  Component Value Date   SARSCOV2NAA POSITIVE (A) 02/20/2020   SARSCOV2NAA NEGATIVE 01/23/2020   [place] NEGATIVE 01/04/2020   [place] NEGATIVE 07/27/2019     Prone/Incentive Spirometry: encouraged incentive spirometry use 3-4/hour.  History of MRSA bacteremia with epidural abscess requiring C4-T2 laminectomy: On IV vancomycin and rifampin-await repeat blood cultures.  Awaiting repeat C-spine MRI.  ESRD:On HD MWF-nephrology following.  Per  nephrology-recent dialysis treatments have been cut short and not completed-presumably due to AMS.  Metabolic acidosis: Likely secondary to incomplete HD treatments-lactate levels within normal limits this morning.  Defer to nephrology.  Hyperkalemia: Likely secondary to incomplete dialysis treatments-improved-remains on Lokelma.  Anemia: Secondary to ESRD-nephrology following.  HTN: BP soft-start midodrine and follow.  Continue to hold all antihypertensives.  HLD: Statin on hold  DM-2 (A1c 6.5 on 8/11): CBG stable-continue SSI  Recent Labs    02/21/20 0658 02/21/20 0726 02/21/20 1007  GLUCAP 184* 182* 163*   Dementia: Reviewed notes from recent admission-thought to have issues with short-term memory-suspect may have underlying dementia-has history of alcohol/cocaine use.  Maintain delirium precautions-resume Seroquel and as needed benzodiazepines.  Deconditioning/debility: Secondary to acute illness on top of chronic debility-Per daughter-has only walked a few steps at SNF.  Await PT/OT eval.  GI prophylaxis: PPI  ABG:    Component Value Date/Time   PHART 7.536 (H) 01/12/2020 1209   PCO2ART 38.3 01/12/2020 1209   PO2ART 125 (H) 01/12/2020 1209   HCO3 32.5 (H) 01/12/2020 1209   TCO2 34 (H) 01/12/2020 1209   ACIDBASEDEF 3.0 (H) 01/06/2020 1651   O2SAT 99.0 01/12/2020 1209    Vent Settings: N/A    Condition - Stable  Family Communication  :  Daughter -[name] [phone])updated over the phone 9/20  Code Status :  Full Code  Diet :  Diet Order            DIET DYS 2 Room service appropriate? No; Fluid consistency: Thin; Fluid restriction: 1200 mL Fluid  Diet effective now                  Disposition Plan  :   Status is: Inpatient  Remains inpatient appropriate because:Inpatient level of care appropriate due to severity of illness  Dispo: The patient is from: SNF              Anticipated d/c is to: Home              Anticipated d/c date is: > 3  days              Patient currently is not medically stable to d/c.   Barriers to discharge: Hypoxia requiring O2 supplementation/complete 5 days of IV Remdesivir  Antimicorbials  :  Anti-infectives (From admission, onward)   Start     Dose/Rate Route Frequency Ordered Stop   02/21/20 1200  vancomycin (VANCOCIN) IVPB 1000 mg/200 mL premix        1,000 mg 200 mL/hr over 60 Minutes Intravenous Every M-W-F (Hemodialysis) 02/20/20 1959     02/21/20 1000  remdesivir 100 mg in sodium chloride 0.9 % 100 mL IVPB       "Followed by" Linked Group Details   100 mg 200 mL/hr over 30 Minutes Intravenous Daily 02/20/20 1632 02/25/20 0959   02/20/20 2000  rifampin (RIFADIN) capsule 300 mg        300 mg Oral Every 12 hours 02/20/20 1907     02/20/20 1730  remdesivir 200 mg in sodium chloride 0.9% 250 mL IVPB       "Followed by" Linked Group Details   200 mg 580 mL/hr over 30 Minutes Intravenous Once 02/20/20 1632 02/20/20 1913      Inpatient Medications  Scheduled Meds: . calcitRIOL  1.5 mcg Oral Q M,W,F-HD  . Chlorhexidine Gluconate Cloth  6 each Topical Q0600  . feeding supplement (PROSource TF)  45 mL Per Tube BID  . heparin  5,000 Units Subcutaneous Q8H  . insulin aspart  0-5 Units Subcutaneous QHS  . insulin aspart  0-6 Units Subcutaneous TID WC  . mouth rinse  15 mL Mouth Rinse BID  . multivitamin  1 tablet Oral QHS  . rifampin  300 mg Oral Q12H  . sevelamer carbonate  1,600 mg Oral TID WC  . zinc sulfate  220 mg Oral Daily   Continuous Infusions: . sodium chloride    . sodium chloride    . remdesivir 100 mg in NS 100 mL 100 mg (02/21/20 0858)  . vancomycin     PRN Meds:.sodium chloride, sodium chloride, acetaminophen **OR** acetaminophen, alteplase, chlorpheniramine-HYDROcodone, guaiFENesin-dextromethorphan, heparin, lidocaine (PF), lidocaine-prilocaine, ondansetron **OR** ondansetron (ZOFRAN) IV, oxyCODONE, pentafluoroprop-tetrafluoroeth  Time Spent in minutes  35  See  all Orders from today for further details   Oren Binet M.D on 02/21/2020 at 11:43 AM  To page go to [email or web address] - use universal password  [facility] -  Office  [phone]    Objective:   Vitals:   02/21/20 0141 02/21/20 0426 02/21/20 0500 02/21/20 0800  BP: (!) 144/70 112/68 (!) 120/58 (!) 97/54  Pulse: 94 (!) 113 99 97  Resp: 20 20 20 19   Temp: 99.8 F (37.7 C) 99.4 F (37.4 C) 98.2 F (36.8 C)   TempSrc: Oral Oral Oral   SpO2: 94% 90% 96% 100%  Weight: 81.7 kg     Height: 6' (1.829 m)       Wt Readings from Last 3 Encounters:  02/21/20 81.7 kg  01/28/20 86.2 kg  01/24/20 89.4 kg     Intake/Output Summary (Last 24 hours) at 02/21/2020 1143 Last data filed at 02/21/2020 0858 Gross per 24 hour  Intake 1898.97 ml  Output --  Net 1898.97 ml     Physical Exam Gen Exam: Not in any distress-mildly confused but able to follow simple commands. HEENT:atraumatic, normocephalic Chest: B/L clear to auscultation anteriorly CVS:S1S2 regular Abdomen:soft non tender, non distended Extremities:no edema Neurology: Generalized weakness-some confusion-but appears to be nonfocal  skin: no rash   Data Review:    CBC Recent Labs  Lab 02/20/20 1315 02/21/20 0017 02/21/20 0434  WBC 4.7 4.5 5.1  HGB 8.5* 7.9* 8.0*  HCT 26.7* 25.0* 24.8*  PLT 181 175 201  MCV 81.7 80.1 77.7*  MCH 26.0 25.3* 25.1*  MCHC 31.8 31.6 32.3  RDW 17.8* 17.9* 17.8*  LYMPHSABS 1.8  --  1.3  MONOABS 0.4  --  0.3  EOSABS 0.0  --  0.0  BASOSABS 0.0  --  0.0    Chemistries  Recent Labs  Lab 02/20/20 1315 02/21/20 0017 02/21/20 0434  NA 131*  --  134*  K 6.2*  --  5.4*  CL 99  --  102  CO2 15*  --  13*  GLUCOSE 87  --  93  BUN 82*  --  93*  CREATININE 13.66* 14.40* 15.31*  CALCIUM 7.9*  --  7.8*  MG  --  2.0 2.1  AST 34  --  33  ALT 19  --  18  ALKPHOS 81  --  80  BILITOT 0.8  --  0.8    ------------------------------------------------------------------------------------------------------------------ Recent Labs    02/20/20 1315  TRIG 108    Lab Results  Component Value Date   HGBA1C 6.5 (H) 01/12/2020   ------------------------------------------------------------------------------------------------------------------ Recent Labs    02/20/20 0018  TSH 3.664   ------------------------------------------------------------------------------------------------------------------ Recent Labs    02/21/20 0018 02/21/20 0019 02/21/20 0434  VITAMINB12  --  489  --   FOLATE  --  13.7  --   FERRITIN 794*  --  753*    Coagulation profile Recent Labs  Lab 02/21/20 0434  INR 1.8*    Recent Labs    02/20/20 1315 02/21/20 0434  DDIMER 2.06* 1.96*    Cardiac Enzymes No results for input(s): CKMB, TROPONINI, MYOGLOBIN in the last 168 hours.  Invalid input(s): CK ------------------------------------------------------------------------------------------------------------------ No results found for: BNP  Micro Results Recent Results (from the past 240 hour(s))  SARS Coronavirus 2 by RT PCR (hospital order, performed in [facility] hospital lab) Nasopharyngeal Nasopharyngeal Swab     Status: Abnormal   Collection Time: 02/20/20  1:11 PM   Specimen: Nasopharyngeal Swab  Result Value Ref Range Status   SARS Coronavirus 2 POSITIVE (A) NEGATIVE Final    Comment: RESULT CALLED TO, READ BACK BY AND VERIFIED WITH: RN E ROSDEL [id number] AT 1539 BY CM (NOTE) SARS-CoV-2 target nucleic acids are DETECTED  SARS-CoV-2 RNA is generally detectable in upper respiratory specimens  during the acute phase of infection.  Positive results are indicative  of the presence of the identified virus, but do not rule out bacterial infection or co-infection with other pathogens not detected by the test.  Clinical correlation with patient history and  other diagnostic information is  necessary to determine patient infection status.  The expected result is negative.  Fact Sheet for Patients:   StrictlyIdeas.no   Fact Sheet for Healthcare Providers:   BankingDealers.co.za    This test is not yet approved or cleared by the [place] FDA and  has been authorized for detection and/or diagnosis of SARS-CoV-2 by FDA under an Emergency Use Authorization (EUA).  This EUA will remain in effect (meaning this test  can be used) for the duration of  the COVID-19 declaration under Section 564(b)(1) of the Act, 21 U.S.C. section 360-bbb-3(b)(1), unless the authorization is terminated or revoked sooner.  Performed at [facility], [place] [address] [place], [place] [address]   Culture, blood (Routine X 2) w Reflex to ID Panel     Status: None (Preliminary result)   Collection Time: 02/21/20 12:06 AM   Specimen: BLOOD RIGHT HAND  Result Value Ref Range Status   Specimen Description BLOOD RIGHT HAND  Final   Special Requests   Final    BOTTLES DRAWN AEROBIC AND ANAEROBIC Blood Culture adequate volume Performed at [facility], [place] [address], [place], [place] [address]    Culture PENDING  Incomplete   Report Status PENDING  Incomplete  MRSA PCR Screening     Status: None   Collection Time: 02/21/20  2:05 AM   Specimen: Nasal Mucosa; Nasopharyngeal  Result Value Ref Range Status   MRSA by PCR NEGATIVE NEGATIVE Final    Comment:        The GeneXpert MRSA Assay (FDA approved for NASAL specimens only), is one component of a comprehensive MRSA colonization surveillance program. It is not intended to diagnose MRSA infection nor to guide or monitor treatment for MRSA infections. Performed at [facility], [place] [address], [place], [place] [address]     Radiology Reports CT Head Wo Contrast  Result Date: 02/20/2020 CLINICAL DATA:  Delirium.  COVID-19 positive.  Recent neck surgery. EXAM: CT HEAD WITHOUT  CONTRAST TECHNIQUE: Contiguous axial images were obtained from the base of the skull through the vertex without intravenous contrast. COMPARISON:  January 28, 2020 FINDINGS: Brain: No subdural, epidural, or subarachnoid hemorrhage. A lacunar infarct in the right thalamus is stable and nonacute. Moderate white matter changes again identified. No acute cortical ischemia or acute infarct noted. No mass effect or midline shift. Ventricles and sulci are unchanged. Cerebellum, brainstem, and basal cisterns are normal. Vascular: Calcified atherosclerosis in the intracranial carotids. Skull: Normal. Negative for fracture or focal lesion. Sinuses/Orbits: Mild scattered mucosal thickening in the ethmoid air cells and left maxillary sinus. No air-fluid levels. Mastoid air cells and middle ears are well aerated. Other: None. IMPRESSION: 1. No acute intracranial abnormalities. Chronic white matter changes. Chronic right thalamic lacunar infarct. 2. Mild sinus disease as above. Electronically Signed   By: Dorise Bullion III M.D   On: 02/20/2020 14:49   CT Head Wo Contrast  Result Date: 01/28/2020 CLINICAL DATA:  [place] after dialysis hit back of head EXAM: CT HEAD WITHOUT CONTRAST CT CERVICAL SPINE WITHOUT CONTRAST TECHNIQUE: Multidetector CT imaging of the head and cervical spine was performed following the standard protocol without intravenous contrast. Multiplanar CT image reconstructions of the cervical spine were also generated. COMPARISON:  Radiograph 01/06/2020, MRI 01/04/2020, CT brain and MRI brain 12/31/2019, CT brain 01/11/2020 FINDINGS: CT HEAD FINDINGS Brain: No acute territorial infarction, hemorrhage, or intracranial mass is visualized. Mild atrophy. Moderate hypodensity in the white matter consistent with chronic small vessel ischemic change. 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 Stable ventricle size Vascular: No hyperdense vessels.  Carotid vascular calcification. Skull: No fracture  Sinuses/Orbits: No acute finding. Other: None CT CERVICAL SPINE FINDINGS Alignment: Reversal of cervical lordosis trace retrolisthesis C4 on C5. Facet alignment is maintained. Skull base and vertebrae: New erosive changes at the anterior arch of C1 and the dens of C2. There are also new erosive changes and sclerosis at the left atlanto occipital articulation. Soft tissues and spinal canal: Marked prevertebral soft tissue swelling with possible prevertebral fluid anterior to C2, series 5, image 38, though difficult to further characterize without contrast. Abnormal epidural density posterior to C2. Disc levels: Patient is status post posterior rods C4 through T1 with transpedicular screws at C4, C5, C6 and T1. Advanced degenerative changes at C6-C7 and C7-T1 with mild degenerative changes elsewhere in the cervical spine. Upper chest: Negative. Other: None IMPRESSION: 1. No CT evidence for acute intracranial abnormality. Atrophy  and chronic small vessel ischemic change of the white matter. 2. New erosive changes at the anterior arch of C1 and the dens of C2, concerning for osteomyelitis. Erosive change also present at the left atlantooccipital articulation, concerning for infection. Marked prevertebral soft tissue swelling with possible prevertebral fluid collection/abscess though further characterisation limited without contrast. Abnormal epidural density at C2, probably representing residual epidural abscess. MRI evaluation is recommended. 3. Posterior rods C4 through T1. Advanced degenerative changes at C6-C7 and C7-T1. Critical Value/emergent results were called by telephone at the time of interpretation on 01/28/2020 at 8:21 pm to provider [facility] , who verbally acknowledged these results. Electronically Signed   By: Donavan Foil M.D.   On: 01/28/2020 20:21   CT Cervical Spine Wo Contrast  Result Date: 02/20/2020 CLINICAL DATA:  COVID positive, recent neck surgery EXAM: CT CERVICAL SPINE WITHOUT  CONTRAST TECHNIQUE: Multidetector CT imaging of the cervical spine was performed without intravenous contrast. Multiplanar CT image reconstructions were also generated. COMPARISON:  CT cervical spine, 01/28/2020 FINDINGS: Alignment: Normal. Skull base and vertebrae: No acute fracture. No primary bone lesion. Erosive change of the dens and adjacent transverse process of C1 are not significantly changed compared to prior examination. Soft tissues and spinal canal: Assessment of the soft tissues is somewhat limited by metallic streak artifact and lack of intravenous contrast; within this limitation, no unexpected fluid collection overlying the laminectomy site. Prevertebral soft tissue edema appears reduced compared to prior examination. No visible canal hematoma. Disc levels: Status post posterior laminectomy and fusion of C4 through T1, with moderate disc space height loss and osteophytosis of C6-C7 and C7-T1. Upper chest: Negative. Other: None. IMPRESSION: 1. Redemonstrated postoperative findings status post posterior laminectomy and fusion of C4 through T1, with moderate disc space height loss and osteophytosis of C6-C7 and C7-T1. 2. Prevertebral soft tissue edema appears reduced compared to prior examination. 3. Postoperative fluid collection overlying laminectomy site does not appear significantly changed compared to prior examination. 4. Assessment of the soft tissues and epidural space is generally limited on noncontrast CT and additionally limited by metallic streak artifact; contrast enhanced MRI is the test of choice for evaluation of edema, fluid collections, the epidural space, and discitis osteomyelitis if clinically suspected in the postoperative setting. Electronically Signed   By: Eddie Candle M.D.   On: 02/20/2020 14:53   CT Cervical Spine Wo Contrast  Result Date: 01/28/2020 CLINICAL DATA:  [place] after dialysis hit back of head EXAM: CT HEAD WITHOUT CONTRAST CT CERVICAL SPINE WITHOUT CONTRAST  TECHNIQUE: Multidetector CT imaging of the head and cervical spine was performed following the standard protocol without intravenous contrast. Multiplanar CT image reconstructions of the cervical spine were also generated. COMPARISON:  Radiograph 01/06/2020, MRI 01/04/2020, CT brain and MRI brain 12/31/2019, CT brain 01/11/2020 FINDINGS: CT HEAD FINDINGS Brain: No acute territorial infarction, hemorrhage, or intracranial mass is visualized. Mild atrophy. Moderate hypodensity in the white matter consistent with chronic small vessel ischemic change. 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 Stable ventricle size Vascular: No hyperdense vessels.  Carotid vascular calcification. Skull: No fracture Sinuses/Orbits: No acute finding. Other: None CT CERVICAL SPINE FINDINGS Alignment: Reversal of cervical lordosis trace retrolisthesis C4 on C5. Facet alignment is maintained. Skull base and vertebrae: New erosive changes at the anterior arch of C1 and the dens of C2. There are also new erosive changes and sclerosis at the left atlanto occipital articulation. Soft tissues and spinal canal: Marked prevertebral soft tissue swelling with possible  prevertebral fluid anterior to C2, series 5, image 38, though difficult to further characterize without contrast. Abnormal epidural density posterior to C2. Disc levels: Patient is status post posterior rods C4 through T1 with transpedicular screws at C4, C5, C6 and T1. Advanced degenerative changes at C6-C7 and C7-T1 with mild degenerative changes elsewhere in the cervical spine. Upper chest: Negative. Other: None IMPRESSION: 1. No CT evidence for acute intracranial abnormality. Atrophy and chronic small vessel ischemic change of the white matter. 2. New erosive changes at the anterior arch of C1 and the dens of C2, concerning for osteomyelitis. Erosive change also present at the left atlantooccipital articulation, concerning for infection. Marked prevertebral soft tissue  swelling with possible prevertebral fluid collection/abscess though further characterisation limited without contrast. Abnormal epidural density at C2, probably representing residual epidural abscess. MRI evaluation is recommended. 3. Posterior rods C4 through T1. Advanced degenerative changes at C6-C7 and C7-T1. Critical Value/emergent results were called by telephone at the time of interpretation on 01/28/2020 at 8:21 pm to provider [facility] , who verbally acknowledged these results. Electronically Signed   By: Donavan Foil M.D.   On: 01/28/2020 20:21   MR CERVICAL SPINE WO CONTRAST  Result Date: 01/29/2020 CLINICAL DATA:  Initial evaluation for epidural abscess. EXAM: MRI CERVICAL SPINE WITHOUT CONTRAST TECHNIQUE: Multiplanar, multisequence MR imaging of the cervical spine was performed. No intravenous contrast was administered. COMPARISON:  Prior CT from 01/28/2020 as well as prior MRI from 01/04/2020. FINDINGS: Alignment: Examination moderately degraded by motion artifact. Straightening with smooth reversal of the normal cervical lordosis. Trace retrolisthesis of C4 on C5, with trace anterolisthesis of C5 on C6. Vertebrae: Postoperative changes from recent C4 through T2 laminectomy with C4 through T1 posterior arthrodesis for debridement of previously identified epidural abscess. Vertebral body height maintained without interval fracture. Abnormal marrow edema with erosive changes seen involving the left greater than right atlantooccipital articulations, concerning for osteomyelitis and septic arthritis. Progressive erosive changes about the dens and anterior arch of C1, also consistent with infection. Changes are new/progressed as compared to prior MRI. Surrounding soft tissue swelling and edema extending from the skull base to approximately C6, consistent with associated phlegmon. Probable small collections seen involving the prevertebral soft tissues at the level of C2, largest of which measures  10 mm on the left (series 1033, image 46). Circumferential epidural collection extending from the foramen magnum to approximately C3-4 consistent with persistent epidural abscess (series 10, image 30 on sagittal view, series 1033, image 44 on axial view). Resultant moderate diffuse spinal stenosis at these levels. No frank cord impingement for compression. Distally, there is increased fluid signal intensity seen within the C5-6, C6-7, and C7-T1 interspace is, new from prior, and suspicious for associated discitis (series 7, image 8). No significant erosion or edema within the adjacent endplates. Cord: No definite cord signal abnormality to suggest myelopathy or edema on this motion degraded exam. Posterior Fossa, vertebral arteries, paraspinal tissues: Remote right pontine lacunar infarct noted. Associated age-related cerebral atrophy. Extensive edema throughout the posterior paraspinous soft tissues extending from the skull base to approximately T2, which could be related to postoperative changes and/or soft tissue infection. Small collection measuring approximately 1.6 x 1.3 x 7.6 cm seen along the midline incision (series 9, image 40). Prevertebral swelling and phlegmon with superimposed small prevertebral collections as above. Normal flow voids preserved within the vertebral arteries bilaterally. Disc levels: C1-2: Circumferential epidural abscess with effacement of the thecal sac. Resultant moderate to severe spinal stenosis without frank  cord compression (series 1033, image 44). C2-C3: No significant disc bulge. Circumferential epidural abscess with resultant moderate spinal stenosis. C3-C4: Diffuse disc bulge with bilateral uncovertebral hypertrophy. Resultant mild-to-moderate spinal stenosis. Moderate right with mild left C4 foraminal narrowing. C4-C5: Small central disc protrusion indents the ventral thecal sac (series 1033, image 68). Prior posterior decompression and fusion. No significant spinal stenosis  or cord deformity. Foramina appear grossly patent. C5-C6: Mild diffuse disc bulge with uncovertebral hypertrophy. Prior posterior decompression with fusion. No residual spinal stenosis. Foramina appear grossly patent. C6-C7: Degenerative intervertebral disc space narrowing with diffuse disc osteophyte, slightly asymmetric to the left. Disc osteophyte indents and partially effaces the left ventral thecal sac. Prior posterior decompression and fusion. No significant spinal stenosis. Severe left C7 foraminal narrowing. No significant right foraminal stenosis. C7-T1: Mild disc bulge with uncovertebral hypertrophy. Prior posterior decompression with fusion. No residual spinal stenosis. Foramina appear grossly patent. IMPRESSION: 1. Postoperative changes from recent C4-T2 laminectomy with C4-T1 posterior arthrodesis for debridement of previously identified epidural abscess. 2. Abnormal marrow edema with erosive changes involving the atlantooccipital and C1-2 articulations, consistent with osteomyelitis and septic arthritis. 3. Persistent circumferential epidural abscess extending from the foramen magnum to approximately C3-4, with resultant moderate to severe spinal stenosis. No frank cord compression or cord signal changes. 4. Extensive edema and phlegmonous change involving the prevertebral soft tissues extending from the clivus to approximately C6, consistent with infection. Superimposed small prevertebral collections measuring up to 10 mm at the level of C2 as above. Edema involving the posterior paraspinous soft tissues could reflect postoperative change and/or infection. Superimposed 1.6 x 1.3 x 7.6 cm collection along the posterior midline incision. 5. Increased fluid signal intensity within the C5-6, C6-7, and C7-T1 interspace, nonspecific, but could reflect associated discitis. Electronically Signed   By: Jeannine Boga M.D.   On: 01/29/2020 01:28   DG Chest Port 1 View  Result Date: 02/20/2020 CLINICAL  DATA:  COVID EXAM: PORTABLE CHEST 1 VIEW COMPARISON:  01/12/2020 FINDINGS: Interval extubation. Interval placement of large-bore right neck vascular catheter, tip position near the superior cavoatrial junction. The heart size and mediastinal contours are within normal limits. Both lungs are clear. The visualized skeletal structures are unremarkable. IMPRESSION: 1. Interval placement of large-bore right neck vascular catheter, tip position near the superior cavoatrial junction. 2. No acute abnormality of the lungs in AP portable projection. Electronically Signed   By: Eddie Candle M.D.   On: 02/20/2020 13:43

## 2020-02-21 NOTE — Progress Notes (Signed)
   02/21/20 0141  Assess: MEWS Score  Temp 99.8 F (37.7 C)  BP (!) 144/70  Pulse Rate 94  ECG Heart Rate 94  Resp 20  Level of Consciousness Alert  SpO2 94 %  O2 Device Nasal Cannula  Patient Activity (if Appropriate) In bed  O2 Flow Rate (L/min) 6 L/min  Assess: MEWS Score  MEWS Temp 0  MEWS Systolic 0  MEWS Pulse 0  MEWS RR 0  MEWS LOC 0  MEWS Score 0  MEWS Score Color Green  Assess: if the MEWS score is Yellow or Red  Were vital signs taken at a resting state? Yes  Focused Assessment No change from prior assessment  Early Detection of Sepsis Score *See Row Information* Medium  MEWS guidelines implemented *See Row Information* No, other (Comment) (no acute changes)  Treat  Pain Scale 0-10  Pain Score 0  Document  Progress note created (see row info) Yes

## 2020-02-21 NOTE — Significant Event (Signed)
Hypoglycemic Event  CBG: 65 @ 0508  Treatment: 4 oz juice/soda  Symptoms: None  Follow-up CBG: Time:  0553 CBG Result:  54  Treatment:  Dextrose 50% 60mL  Symptoms:  None  Follow-up CBG: Time:  0621  CBG Result:  66  Treatment:  Dextrose 50% 68mL  Symptoms:  None  Follow-up CBG: Time:  0658  CBG Result:  184  Possible Reasons for Event: Inadequate meal intake  Comments/MD notified:  Ninetta Lights, MD    Harl Bowie

## 2020-02-21 NOTE — Progress Notes (Signed)
[name] Check. was admitted to 5W02 from the ED via stretcher.  The patient is alert and oriented to self only.  He correctly states that he is in the hospital, but cannot name the hospital or location.  Bed is in the lowest position, bed alarm activated.  Call bell and telephone are within reach.  Mitts placed on the patient for safety, due to concerns that patient had previously pulled out IV's and patient currently has an IJ HD cath.  Pt is calm and resting comfortably at this time.  Pt requesting ice chips, noted to have passed [place], paged MD who gave diet orders.

## 2020-02-22 ENCOUNTER — Inpatient Hospital Stay (HOSPITAL_COMMUNITY): Payer: No Typology Code available for payment source

## 2020-02-22 DIAGNOSIS — G9341 Metabolic encephalopathy: Secondary | ICD-10-CM | POA: Diagnosis not present

## 2020-02-22 DIAGNOSIS — D638 Anemia in other chronic diseases classified elsewhere: Secondary | ICD-10-CM | POA: Diagnosis not present

## 2020-02-22 DIAGNOSIS — G062 Extradural and subdural abscess, unspecified: Secondary | ICD-10-CM | POA: Diagnosis not present

## 2020-02-22 DIAGNOSIS — U071 COVID-19: Secondary | ICD-10-CM | POA: Diagnosis not present

## 2020-02-22 LAB — CBC WITH DIFFERENTIAL/PLATELET
Abs Immature Granulocytes: 0.04 10*3/uL (ref 0.00–0.07)
Basophils Absolute: 0 10*3/uL (ref 0.0–0.1)
Basophils Relative: 0 %
Eosinophils Absolute: 0 10*3/uL (ref 0.0–0.5)
Eosinophils Relative: 0 %
HCT: 23.3 % — ABNORMAL LOW (ref 39.0–52.0)
Hemoglobin: 7.6 g/dL — ABNORMAL LOW (ref 13.0–17.0)
Immature Granulocytes: 1 %
Lymphocytes Relative: 42 %
Lymphs Abs: 2.1 10*3/uL (ref 0.7–4.0)
MCH: 25.1 pg — ABNORMAL LOW (ref 26.0–34.0)
MCHC: 32.6 g/dL (ref 30.0–36.0)
MCV: 76.9 fL — ABNORMAL LOW (ref 80.0–100.0)
Monocytes Absolute: 0.2 10*3/uL (ref 0.1–1.0)
Monocytes Relative: 4 %
Neutro Abs: 2.7 10*3/uL (ref 1.7–7.7)
Neutrophils Relative %: 53 %
Platelets: 238 10*3/uL (ref 150–400)
RBC: 3.03 MIL/uL — ABNORMAL LOW (ref 4.22–5.81)
RDW: 18 % — ABNORMAL HIGH (ref 11.5–15.5)
WBC: 5.1 10*3/uL (ref 4.0–10.5)
nRBC: 0 % (ref 0.0–0.2)

## 2020-02-22 LAB — COMPREHENSIVE METABOLIC PANEL
ALT: 74 U/L — ABNORMAL HIGH (ref 0–44)
AST: 237 U/L — ABNORMAL HIGH (ref 15–41)
Albumin: 2.3 g/dL — ABNORMAL LOW (ref 3.5–5.0)
Alkaline Phosphatase: 68 U/L (ref 38–126)
Anion gap: 18 — ABNORMAL HIGH (ref 5–15)
BUN: 53 mg/dL — ABNORMAL HIGH (ref 8–23)
CO2: 18 mmol/L — ABNORMAL LOW (ref 22–32)
Calcium: 7.7 mg/dL — ABNORMAL LOW (ref 8.9–10.3)
Chloride: 98 mmol/L (ref 98–111)
Creatinine, Ser: 10.29 mg/dL — ABNORMAL HIGH (ref 0.61–1.24)
GFR calc Af Amer: 5 mL/min — ABNORMAL LOW (ref >60)
GFR calc non Af Amer: 5 mL/min — ABNORMAL LOW (ref >60)
Glucose, Bld: 169 mg/dL — ABNORMAL HIGH (ref 70–99)
Potassium: 3.8 mmol/L (ref 3.5–5.1)
Sodium: 134 mmol/L — ABNORMAL LOW (ref 135–145)
Total Bilirubin: 1.1 mg/dL (ref 0.3–1.2)
Total Protein: 7.3 g/dL (ref 6.5–8.1)

## 2020-02-22 LAB — D-DIMER, QUANTITATIVE: D-Dimer, Quant: 8.28 ug/mL-FEU — ABNORMAL HIGH (ref 0.00–0.50)

## 2020-02-22 LAB — GLUCOSE, CAPILLARY
Glucose-Capillary: 100 mg/dL — ABNORMAL HIGH (ref 70–99)
Glucose-Capillary: 119 mg/dL — ABNORMAL HIGH (ref 70–99)
Glucose-Capillary: 121 mg/dL — ABNORMAL HIGH (ref 70–99)
Glucose-Capillary: 127 mg/dL — ABNORMAL HIGH (ref 70–99)
Glucose-Capillary: 128 mg/dL — ABNORMAL HIGH (ref 70–99)
Glucose-Capillary: 61 mg/dL — ABNORMAL LOW (ref 70–99)
Glucose-Capillary: 88 mg/dL (ref 70–99)
Glucose-Capillary: 90 mg/dL (ref 70–99)
Glucose-Capillary: 91 mg/dL (ref 70–99)

## 2020-02-22 LAB — PROCALCITONIN: Procalcitonin: 1.66 ng/mL

## 2020-02-22 LAB — PHOSPHORUS: Phosphorus: 8.3 mg/dL — ABNORMAL HIGH (ref 2.5–4.6)

## 2020-02-22 LAB — CORTISOL: Cortisol, Plasma: 19.6 ug/dL

## 2020-02-22 LAB — LACTIC ACID, PLASMA: Lactic Acid, Venous: 2.3 mmol/L (ref 0.5–1.9)

## 2020-02-22 LAB — MAGNESIUM: Magnesium: 2 mg/dL (ref 1.7–2.4)

## 2020-02-22 LAB — FERRITIN: Ferritin: >7500 ng/mL — ABNORMAL HIGH (ref 24–336)

## 2020-02-22 LAB — C-REACTIVE PROTEIN: CRP: 8.7 mg/dL — ABNORMAL HIGH (ref <1.0)

## 2020-02-22 IMAGING — MR MR CERVICAL SPINE W/O CM
4 of 5 series · 17 of 48 positions shown · non-contrast
Comparison: Recent CT head and cervical spine [DATE].
Postoperative MRI [DATE]. Preoperative MRI

CLINICAL DATA: 68-year-old male with history of dorsal cervical
epidural abscess, status post operative drainage and decompression
last month on [DATE]. Follow-up MRI [DATE] with new left
occipital condyle, anterior C1 and odontoid marrow edema and erosion
compatible with skull base osteomyelitis. End stage renal disease on
dialysis. Confusion. Positive [YB].

EXAM:
MRI CERVICAL SPINE WITHOUT CONTRAST
TECHNIQUE: Multiplanar, multisequence MR imaging of the cervical spine was
performed. No intravenous contrast was administered.

[Series 2: T2 · sagittal · 3.0mm · 0.47mm/px · 5 of 13 slices shown (1 of 2)]
[im 1/13]
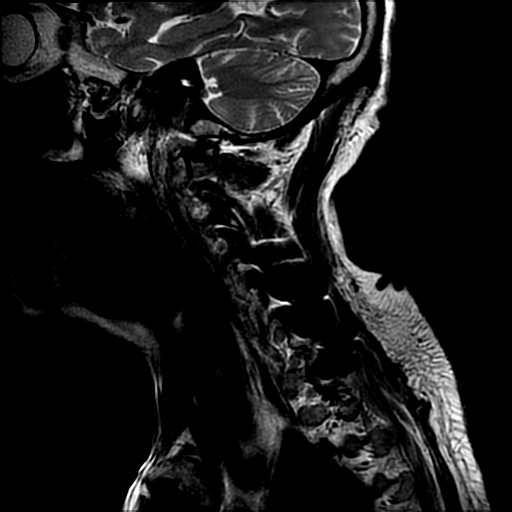
[im 4/13]
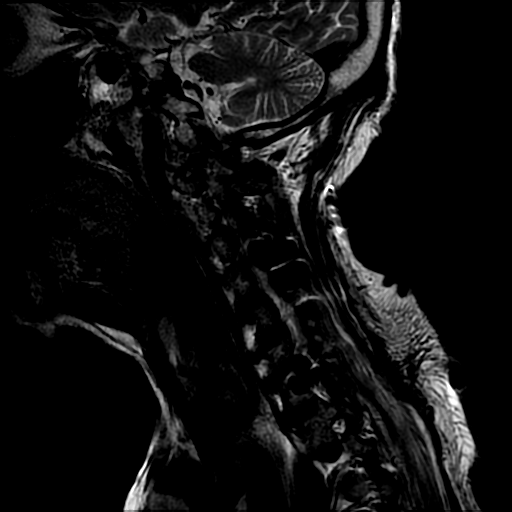
[im 7/13]
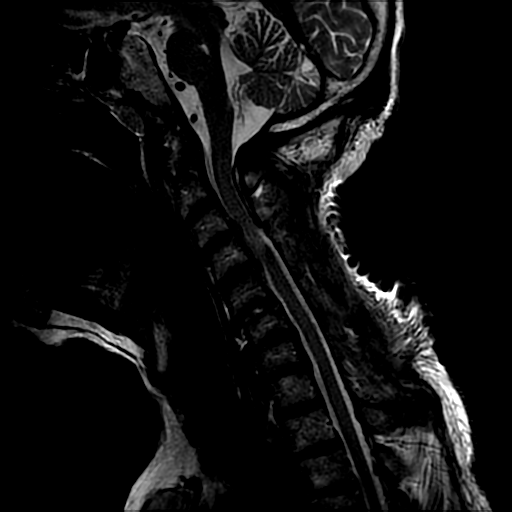
[im 10/13]
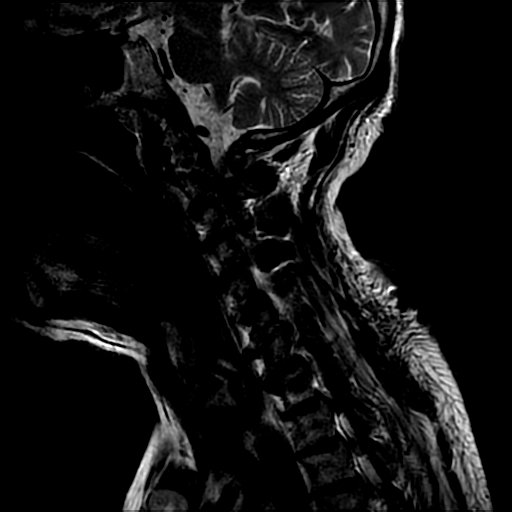
[im 13/13]
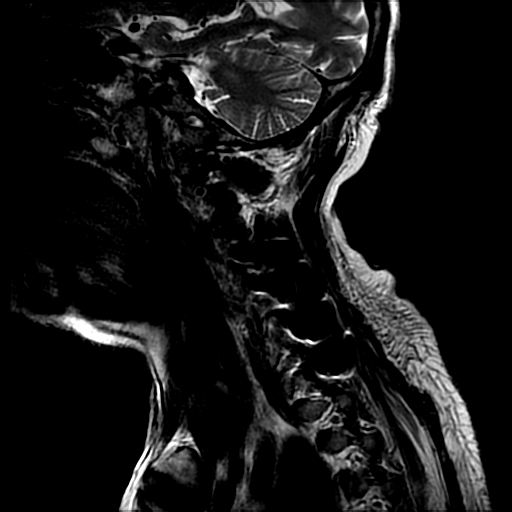

[Series 3: sag ir · sagittal · 3.0mm · 0.47mm/px · 3 of 13 slices shown]
[im 1/13]
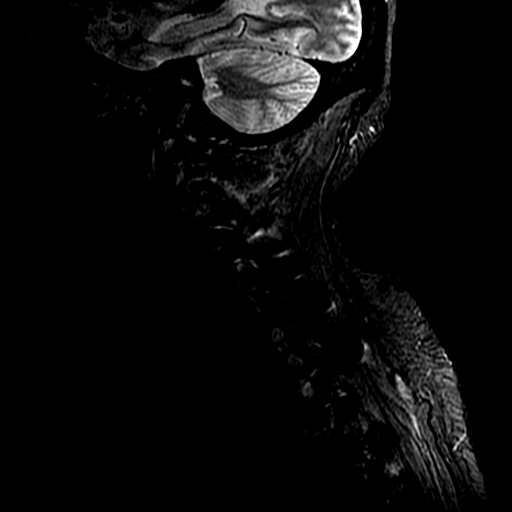
[im 7/13]
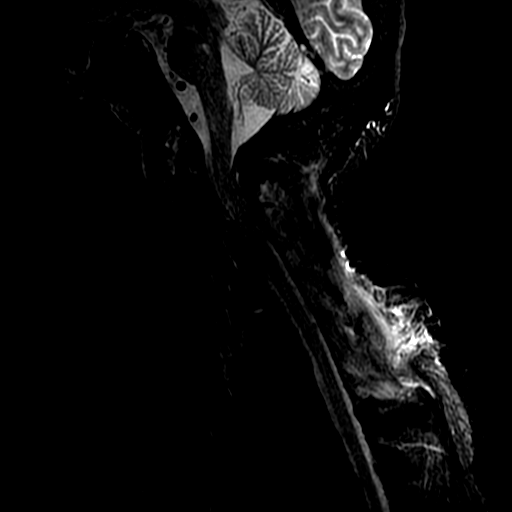
[im 13/13]
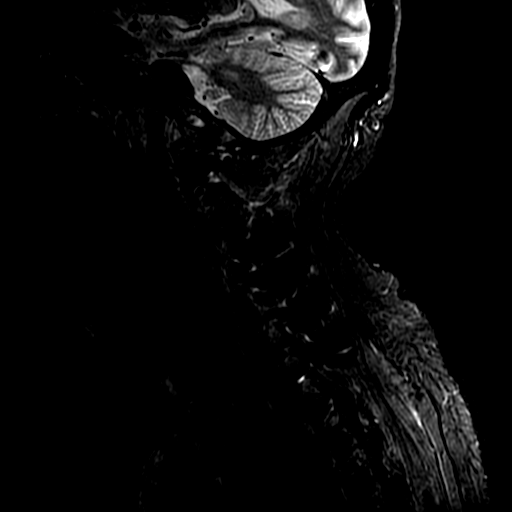

[Series 4: FLAIR · sagittal · 3.0mm · 0.47mm/px · 3 of 13 slices shown]
[im 3/13]
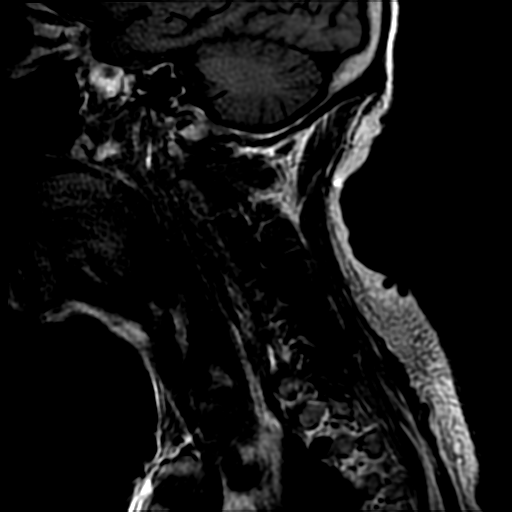
[im 8/13]
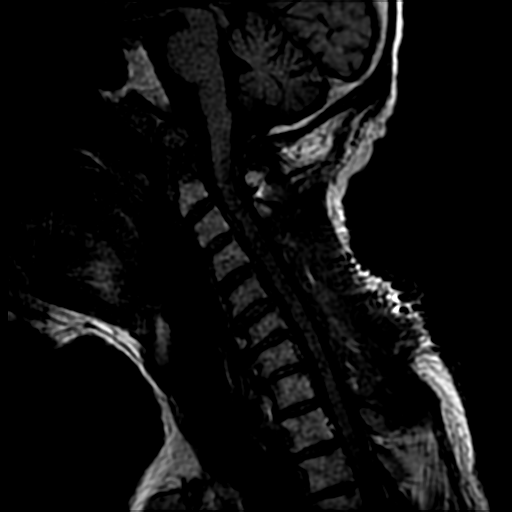
[im 13/13]
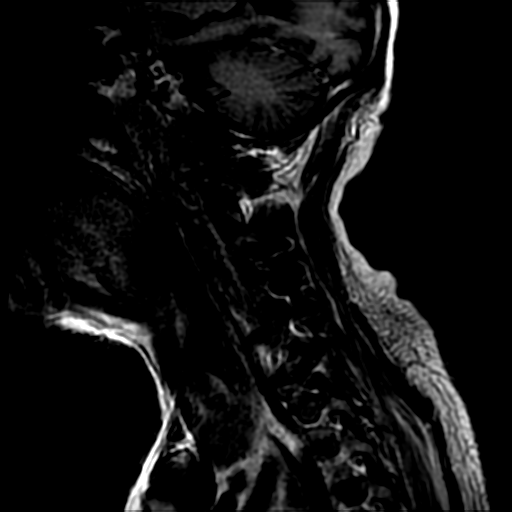

[Series 5: T2 · axial · 3.0mm · 0.43mm/px · z∈[-97,-5]mm · 6 of 36 slices shown (2 of 2)]
[im 3/36]
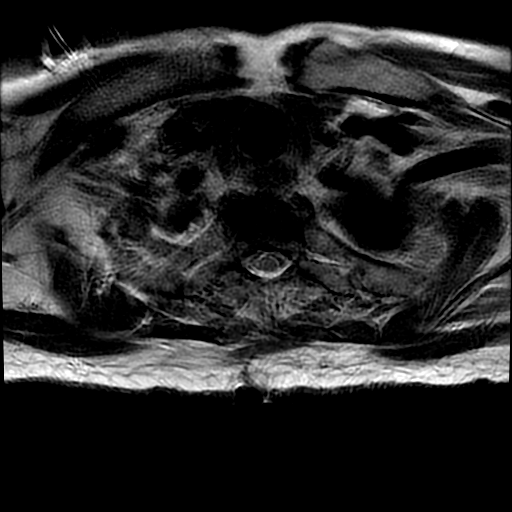
[im 5/36]
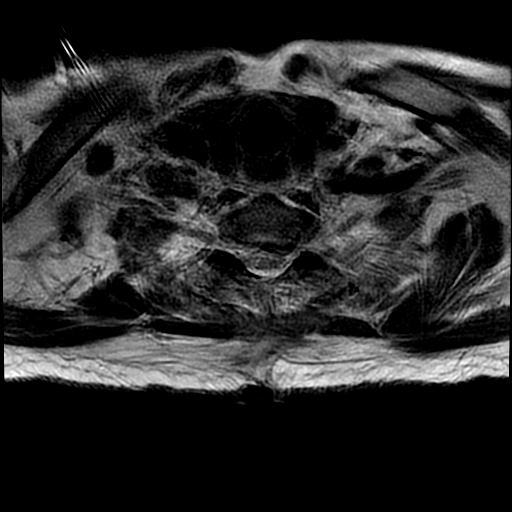
[im 8/36]
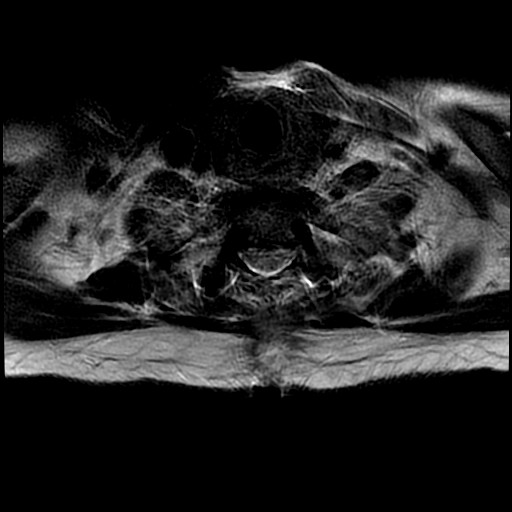
[im 12/36]
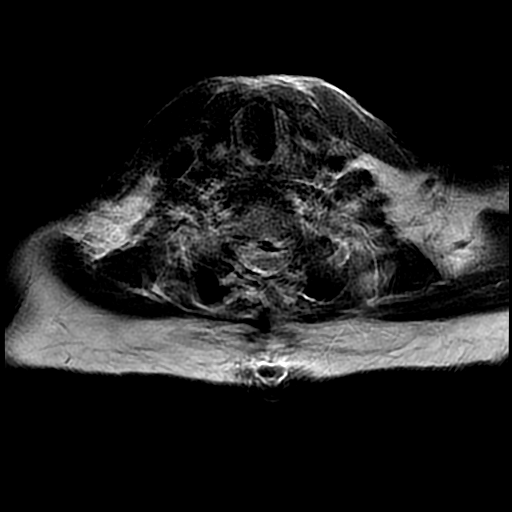
[im 19/36]
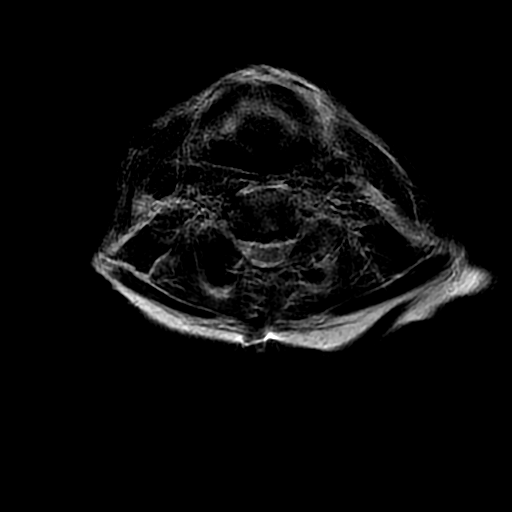
[im 31/36]
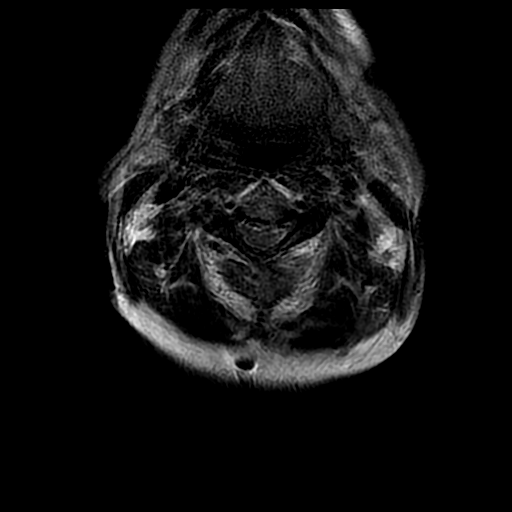

[17 of 48 positions shown; findings below may reference images not displayed]

FINDINGS: The examination had to be discontinued just prior to completion due
to confusion, agitation. Only axial GRE imaging was not obtained.

Alignment: Straightening and mild reversal of cervical lordosis
remains improved since [DATE].

Vertebrae: Abnormal marrow edema and/or T2 and STIR hyperintense
soft tissue surrounding the odontoid (series 3, image 7) which has
been partially eroded along with the adjacent clivus and anterior C1
ring as seen on the recent CT. Mild marrow edema in both occipital
condyles, greater on the left. Faint marrow edema also in the C2-C3
facets, mostly on the right (series 3, image 2). Overall the
appearance has not progressed since [DATE].

Sequelae of posterior decompression and fusion from C3 through T1.
Mild hardware susceptibility artifact. No other cervical or upper
thoracic marrow edema or acute osseous abnormality.

Cord: Suboptimal cord detail due to motion and hardware
susceptibility. No definite cervical or upper thoracic spinal cord
signal abnormality. Normalized epidural space since [REDACTED].

Posterior Fossa, vertebral arteries, paraspinal tissues: Major
vascular flow voids are preserved in the neck. Postoperative changes
to the posterior neck soft tissues with no adverse features.
Negative visible lung apices.

Cervicomedullary junction and visible posterior fossa are stable and
within normal limits.

Disc levels:
C2-C3: Improved thecal sac patency since [DATE]. Foraminal
endplate spurring and moderate bilateral facet degeneration.

Mild residual spinal stenosis. Mild bilateral C3 foraminal stenosis.

C3-C4: Chronic central disc protrusion (series 2, image 7) with mild
facet and ligament flavum hypertrophy. Improved thecal sac patency
since [DATE] but residual mild spinal stenosis and mild cord
mass effect. Moderate bilateral C4 foraminal stenosis.

C4-C5 through T1-T2: Posterior decompression with no significant
spinal stenosis despite residual disc and endplate degeneration at
some levels. There is mild to moderate bilateral C8 foraminal
stenosis related to disc, endplate and residual facet hypertrophy at
C7-T1.
IMPRESSION: 1. Osteomyelitis at the skull base, anterior C1 and odontoid.
Possible involvement of the right C2-C3 facet joint also.
Associated erosion of bone since [DATE] as demonstrated by CT
two days ago.
But on MRI no other significant progression is identified since
[REDACTED]; no new levels of involvement.

2. Resolved posterior epidural abscess following surgery last month.
Satisfactorily decompressed levels C4-C5 through T1-T2.

3. Improved thecal sac patency but mild residual degenerative spinal
stenosis at both C2-C3 and C3-C4. No definite spinal cord signal
abnormality.

## 2020-02-22 MED ORDER — DEXTROSE 50 % IV SOLN
INTRAVENOUS | Status: AC
  Administered 2020-02-22: 50 mL
  Filled 2020-02-22: qty 50

## 2020-02-22 MED ORDER — SODIUM CHLORIDE 0.9 % IV BOLUS
250.0000 mL | Freq: Once | INTRAVENOUS | Status: AC
  Administered 2020-02-22: 250 mL via INTRAVENOUS

## 2020-02-22 MED ORDER — PROSOURCE PLUS PO LIQD
30.0000 mL | Freq: Two times a day (BID) | ORAL | Status: DC
  Administered 2020-02-23 – 2020-02-24 (×3): 30 mL via ORAL
  Filled 2020-02-22 (×6): qty 30

## 2020-02-22 MED ORDER — LORAZEPAM 2 MG/ML IJ SOLN: 1.0000 mg | INTRAMUSCULAR | Status: DC | PRN

## 2020-02-22 MED ORDER — DARBEPOETIN ALFA 100 MCG/0.5ML IJ SOSY: 100.0000 ug | PREFILLED_SYRINGE | INTRAMUSCULAR | Status: DC

## 2020-02-22 NOTE — TOC Initial Note (Addendum)
Transition of Care (TOC) - Initial/Assessment Note    Patient Details  Name: [name]. MRN: [id number] Date of Birth: 08/09/1950  [facility] CM/SW Contact:    Benard Halsted, LCSW Phone Number: 02/22/2020, 3:04 PM  Clinical Narrative:                 CSW received consult for possible return to SNF at time of discharge. CSW spoke with patient's daughter, Joseph Art, regarding patient returning to [facility]. She reported that she does not want him to return there. CSW explained that due to COVID and him requiring third shift dialysis that there are no other accepting SNFs right now. She expressed understanding and though she knows patient requires a lot of care, she believes it is in his best interest to return home instead of going back to [facility]. She stated she has been in contact with patient's [facility] social worker, Silas Flood [phone]), about home health services and DME. Marlin provided her with a list of in-network home health agencies that she is reviewing. CSW discussed equipment needs and she requested a hospital bed, wheelchair, rolling walker, 3in1 bedside commode, gate belt, and PPE for COVID. CSW confirmed PCP and address. CSW spoke with Ralene Bathe. He requested CSW fax (f. [phone]) over DME orders, therapy notes, and HH orders at least one day prior to discharge; he is not sure that they can provide PPE but he will look into it. CSW inquired if the [place] provided transportation for Dialysis but he stated that they do not have any services at this time. CSW faxed [facility] application in and [facility] can assist in scheduling transportation to COVID shift appointments.   Expected Discharge Plan: [place] Barriers to Discharge: Continued Medical Work up   Patient Goals and CMS Choice Patient states their goals for this hospitalization and ongoing recovery are:: Return home CMS Medicare.gov Compare Post  Acute Care list provided to:: Patient Represenative (must comment) (Dtr, Renee) Choice offered to / list presented to : Adult Children  Expected Discharge Plan and Services Expected Discharge Plan: [place] In-house Referral: Clinical Social Work Discharge Planning Services: CM Consult Post Acute Care Choice: Durable Medical Equipment, Home Health Living arrangements for the past 2 months: Apartment                                      Prior Living Arrangements/Services Living arrangements for the past 2 months: Apartment Lives with:: Self Patient language and need for interpreter reviewed:: Yes Do you feel safe going back to the place where you live?: Yes      Need for Family Participation in Patient Care: Yes (Comment) Care giver support system in place?: Yes (comment)   Criminal Activity/Legal Involvement Pertinent to Current Situation/Hospitalization: No - Comment as needed  Activities of Daily Living   ADL Screening (condition at time of admission) Patient's cognitive ability adequate to safely complete daily activities?: No Is the patient deaf or have difficulty hearing?: No Does the patient have difficulty seeing, even when wearing glasses/contacts?: No Does the patient have difficulty concentrating, remembering, or making decisions?: Yes Patient able to express need for assistance with ADLs?: No Does the patient have difficulty dressing or bathing?: Yes Independently performs ADLs?: No Does the patient have difficulty walking or climbing stairs?: Yes Weakness of Legs: Both Weakness  of Arms/Hands: Both  Permission Sought/Granted Permission sought to share information with : Facility Sport and exercise psychologist, Family Supports Permission granted to share information with : No  Share Information with NAME: Joseph Art  Permission granted to share info w AGENCY: Home Health/[facility]  Permission granted to share info w Relationship: Daughter  Permission  granted to share info w Contact Information: [phone]  Emotional Assessment   Attitude/Demeanor/Rapport: Unable to Assess Affect (typically observed): Unable to Assess Orientation: : Oriented to Self Alcohol / Substance Use: Not Applicable Psych Involvement: No (comment)  Admission diagnosis:  Metabolic encephalopathy [U98.11] Hyperkalemia [E87.5] Uremia [N19] ESRD (end stage renal disease) ([place]) [N18.6] Hx of bacteremia [B14.782] Acute metabolic encephalopathy [N56.21] COVID-19 [U07.1] Patient Active Problem List   Diagnosis Date Noted  . COVID-19 virus infection   . Acute metabolic encephalopathy   . Hemodialysis patient ([place])   . Bacteremia associated with intravascular line ([place])   . MRSA bacteremia 01/05/2020  . 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 Acute encephalopathy 01/04/2020  . Anemia of chronic disease 02/11/2019  . Type 2 diabetes mellitus with renal complication ([place]) 30/86/5784  . ESRD (end stage renal disease) ([place])   . Cardiac murmur 01/08/2019  . Preoperative clearance 01/08/2019  . Uncontrolled Type 2 DM 09/15/2013  . HYPERTENSION 03/08/2013  . CKD  06/24/2012  . Hyperkalemia 06/24/2012  . Colon cancer, status Post resection  05/11/2012  . Malnutrition of moderate degree ([place]) 02/24/2012  . Health care maintenance 11/27/2011  . Obesity (BMI 30.0-34.9) 10/20/2011  . Cataract, bilateral 08/29/2011  . Hyperlipidemia 04/14/2006  . MICROCYTIC ANEMIA 04/14/2006  . TOBACCO ABUSE 04/14/2006  . OBSTRUCTIVE SLEEP APNEA 04/14/2006   PCP:  Clinic, [place]:  No Pharmacies Listed    Social Determinants of Health (SDOH) Interventions    Readmission Risk Interventions Readmission Risk Prevention Plan 02/22/2020 01/18/2020  Transportation Screening Complete Complete  Medication Review (RN Care Manager) Referral to Pharmacy Complete  PCP or Specialist appointment within 3-5 days of discharge Complete Complete  HRI or [place] Complete  Complete  SW Recovery Care/Counseling Consult Complete Complete  Palliative Care Screening Not Applicable Complete  [place] Patient Refused Complete  Some recent data might be hidden

## 2020-02-22 NOTE — Progress Notes (Signed)
   02/22/20 2200  Assess: MEWS Score  BP (!) 104/55  Pulse Rate 81  ECG Heart Rate 85  SpO2 92 %  Assess: MEWS Score  MEWS Temp 0  MEWS Systolic 0  MEWS Pulse 0  MEWS RR 0  MEWS LOC 0  MEWS Score 0  MEWS Score Color [name]  Notify: Provider  Provider Burton Apley, MD  Date Provider Notified 02/22/20  Time Provider Notified 1040  Notification Type Page  Notification Reason Other (Comment)   Patient confused. Per Oliva Bustard, RN, patient refusing medications, pulled out IV, refusing new IV. Bps were low early this AM, but 104/55 now. CBG low this AM, but 90 now. Marlowe Sax, MD notified via text page. Will continue to monitor.

## 2020-02-22 NOTE — Progress Notes (Signed)
   02/21/20 2200  Assess: MEWS Score  Temp 99.9 F (37.7 C)  BP (!) 92/41  ECG Heart Rate (!) 108  Resp (!) 28  SpO2 100 %  O2 Device Nasal Cannula  Patient Activity (if Appropriate) In bed  O2 Flow Rate (L/min) 2 L/min  Assess: MEWS Score  MEWS Temp 0  MEWS Systolic 1  MEWS Pulse 1  MEWS RR 2  MEWS LOC 0  MEWS Score 4  MEWS Score Color Red  Assess: if the MEWS score is Yellow or Red  Were vital signs taken at a resting state? Yes  Focused Assessment No change from prior assessment  Early Detection of Sepsis Score *See Row Information* Low  MEWS guidelines implemented *See Row Information* Yes  Treat  MEWS Interventions Escalated (See documentation below)  Take Vital Signs  Increase Vital Sign Frequency  Red: Q 1hr X 4 then Q 4hr X 4, if remains red, continue Q 4hrs  Escalate  MEWS: Escalate Red: discuss with charge nurse/RN and provider, consider discussing with RRT  Notify: Charge Nurse/RN  Name of Charge Nurse/RN Notified Nikki, RN  Date Charge Nurse/RN Notified 02/21/20  Time Charge Nurse/RN Notified 2250  Notify: Provider  Provider Name/Title Rathore, MD  Date Provider Notified 02/21/20  Time Provider Notified 2246  Notification Type Page  Notification Reason Other (Comment) (RED MEWS. Current VS.)  Response No new orders (Midodrine PO to be given per Marlowe Sax, MD.)  Date of Provider Response 02/21/20  Time of Provider Response 2337  Document  Patient Outcome Not stable and remains on department   Patient resting in bed at this time. Patient tachypneic, but respirations unlabored.  Midodrine that patient did not receive earlier to be given PO. Will continue to monitor.

## 2020-02-22 NOTE — Progress Notes (Addendum)
   02/22/20 0130  Assess: MEWS Score  BP (!) 80/40  Pulse Rate (!) 102  ECG Heart Rate (!) 102  SpO2 100 %  O2 Device Nasal Cannula  Patient Activity (if Appropriate) In bed  O2 Flow Rate (L/min) 2 L/min  Assess: MEWS Score  MEWS Temp 2  MEWS Systolic 2  MEWS Pulse 1  MEWS RR 1  MEWS LOC 0  MEWS Score 6  MEWS Score Color Red  Notify: Provider  Provider Burton Apley, MD  Date Provider Notified 02/22/20  Time Provider Notified [phone] (Re-paged at [employer].)  Notification Type Page  Notification Reason Other (Comment) (Continuing/worsening hypotension.)  Response See new orders  Date of Provider Response 02/22/20  Time of Provider Response 0246  Document  Patient Outcome Not stable and remains on department   Manual BP. Patient remains asymptomatic. MD notified of fever. MD ordered labs and NS bolus. Will continue to monitor.

## 2020-02-22 NOTE — Progress Notes (Signed)
CRITICAL VALUE ALERT  Critical Value:  2.3 lactic acid  Date & Time Notied:  923 02/22/20  Provider Notified: Dr. Sloan Leiter  Orders Received/Actions taken: No new orders at this time

## 2020-02-22 NOTE — Progress Notes (Signed)
   02/22/20 0400  Vitals  Temp 100 F (37.8 C)  Temp Source Oral  BP (!) 84/46  MAP (mmHg) (!) 58  BP Location Right Arm  BP Method Automatic  Patient Position (if appropriate) Lying  Pulse Rate Source Monitor  ECG Heart Rate 96  Resp (!) 22  Oxygen Therapy  O2 Device Room Air  Patient Activity (if Appropriate) In bed  Pulse Oximetry Type Continuous  Pain Assessment  Pain Scale PAINAD  PAINAD (Pain Assessment in Advanced Dementia)  Breathing 0  Negative Vocalization 0  Facial Expression 0  Body Language 1  Consolability 1  PAINAD Score 2  POSS Scale ([place] Opioid Sedation Scale)  POSS *See Group Information* 2-Acceptable,Slightly drowsy, easily aroused  Provider Notification  Provider Name/Title Rathore, MD  Date Provider Notified 02/22/20  Time Provider Notified [phone]  Notification Type Page  Notification Reason Other (Comment) (MD wanted paged if repeat MAP < 60.)  Response No new orders   BP 91/40 at this time. Will continue to monitor.

## 2020-02-22 NOTE — Progress Notes (Addendum)
PROGRESS NOTE                                                                                                                                                                                                             Patient Demographics:    [name], is a 69 y.o. male, DOB - Dec 01, 1950, PPJ:[id number]  Outpatient Primary MD for the patient is [facility], [facility]   Admit date - 02/20/2020   LOS - 2  Chief Complaint  Patient presents with  . Covid Positive  . AMS       Brief Narrative: Patient is a 69 y.o. male with PMHx of ESRD on HD MWF, DM-2, MRSA bacteremia with epidural abscess s/p C4-T2 laminectomy/fusion on 01/06/2020-resident of SNF-apparently tested positive for COVID-19 on 9/18-subsequently brought to the ED on 9/19 for worsening confusion (more than baseline)-fever-thought to have acute metabolic encephalopathy from COVID-19 infection and admitted to the hospitalist service.  See below for further details.  COVID-19 vaccinated status: Unvaccinated  Significant Events: 8/3-8/23>> hospitalized for epidural abscess/MRSA bacteremia-underwent laminectomy-briefly intubated due to altered mental status.  Discharged to SNF 9/18>> COVID-19 positive at SNF 9/19>> Admit to [facility] for fever/confusion  Significant studies: 9/19>>Chest x-ray: No acute abnormality in the lung. 9/19>> CT head: No acute intracranial abnormality 9/19>> CT C-spine: Postoperative findings of posterior laminectomy/fusion C4-T1, prevertebral soft tissue edema appears reduced, postoperative fluid collection overlies laminectomy site does not appear significantly changed compared to prior examination. 9/20>> MRI C-spine: Attempted but due to AMS-not completed 9/21>> MRI C Spine: Osteomyelitis of skull base, anterior C1 and odontoid, C2-C3 facet joint-associated erosion of bone since 8/27-resolved epidural abscess  COVID-19  medications: Remdesivir: 9/19>>  Antibiotics: IV vancomycin/rifampin plan to continue through October 1  Microbiology data: 9/20 >>blood culture: No growth so far 9/21>> blood culture: No growth so far  Procedures: None  Consults: Nephrology  DVT prophylaxis: heparin injection 5,000 Units Start: 02/20/20 1645 SCDs Start: 02/20/20 1628   Subjective:   Brief episode of hypotension last night-BP stable this morning-remains unchanged-able to follow simple commands-answers simple questions appropriately.  Comfortable in bed.   Assessment  & Plan :   Acute metabolic encephalopathy: Multifactorial-secondary to uremia (under dialysis-did not complete multiple outpatient HD sessions) and sepsis physiology likely secondary to COVID-19.  Improved-suspect current mental status not far from baseline.  Neuroimaging as  above.   Sepsis: Continues to have fever-which I suspect is probably from his COVID-19 infection-he appears to have residual osteomyelitis on MRI C-spine but blood cultures negative so far.  Remains on empiric rifampin and vancomycin.   Acute Hypoxic Resp Failure due to Covid 19 Viral pneumonia: Although no obvious infiltrates seen on chest x-ray-given hypoxia-presumed to have COVID-19 pneumonia-on steroids/remdesivir.  Continue to attempt to titrate off FiO2.  Fever: afebrile O2 requirements:  SpO2: 94 % O2 Flow Rate (L/min): 2 L/min   COVID-19 Labs: Recent Labs    02/20/20 1315 02/21/20 0018 02/21/20 0434 02/22/20 0107  DDIMER 2.06*  --  1.96* 8.28*  FERRITIN  --  794* 753* >7,500*  LDH 241*  --   --   --   CRP  --  9.3* 9.2* 8.7*    No results found for: BNP  Recent Labs  Lab 02/20/20 1315 02/21/20 0434 02/22/20 0837  PROCALCITON 0.64 0.80 1.66    Lab Results  Component Value Date   SARSCOV2NAA POSITIVE (A) 02/20/2020   SARSCOV2NAA NEGATIVE 01/23/2020   SARSCOV2NAA NEGATIVE 01/04/2020   [place] NEGATIVE 07/27/2019     Prone/Incentive  Spirometry: encouraged incentive spirometry use 3-4/hour.  Elevated D-dimer: Secondary to COVID-19-no worsening hypoxemia-therefore doubt PE-we will check lower extremity Doppler. If hypoxemia worsens-we will get CTA chest.  History of MRSA bacteremia with epidural abscess requiring C4-T2 laminectomy: MRI C-spine completed on 9/21-appears to have residual osteomyelitis of the skull base area-have discussed with neurosurgeon-Dr. Delila Spence will review MRI and provide further recommendations. Discussed with infectious disease MD-Dr. Rulon Abide recommends continuing antibiotics as previously recommended through October 1-he will arrange for follow-up at the ID clinic.  ESRD:On HD MWF-nephrology following.  Per nephrology-patient does not complete outpatient HD sessions-resulted in uremia on presentation.  Nephrology plans to dialyze again tomorrow.  Metabolic acidosis: Likely secondary to incomplete HD treatments-with HD x2.  Hyperkalemia: Likely secondary to incomplete dialysis treatments-resolved with hemodialysis-stop Lokelma.  Anemia: Secondary to ESRD-worsened by acute illness-defer Aranesp/IV iron to nephrology-no evidence of acute blood loss/GI bleeding.  Transfuse if hemoglobin less than 7.  Transient hypotension: Developed last night-likely due to volume removal with HD-on midodrine-BP stable this morning.  HTN: All antihypertensives on hold-on midodrine.   HLD: Statin on hold  DM-2 (A1c 6.5 on 8/11): Hypoglycemic this morning-stop SSI-follow.  Recent Labs    02/22/20 0730 02/22/20 0807 02/22/20 1211  GLUCAP 61* 127* 88   Dementia: Reviewed notes from recent admission-thought to have issues with short-term memory-suspect may have underlying dementia-has history of alcohol/cocaine use.  Maintain delirium precautions-continue Seroquel and as needed clonazepam.  Deconditioning/debility: Secondary to acute illness on top of chronic debility-Per daughter-has only walked a few  steps at SNF.  PT eval completed-SNF on discharge.  GI prophylaxis: PPI  ABG:    Component Value Date/Time   PHART 7.536 (H) 01/12/2020 1209   PCO2ART 38.3 01/12/2020 1209   PO2ART 125 (H) 01/12/2020 1209   HCO3 32.5 (H) 01/12/2020 1209   TCO2 34 (H) 01/12/2020 1209   ACIDBASEDEF 3.0 (H) 01/06/2020 1651   O2SAT 99.0 01/12/2020 1209    Vent Settings: N/A   Condition - Stable  Family Communication  :  Daughter -Zakar Brosch [phone])updated over the phone 9/21  Code Status :  Full Code  Diet :  Diet Order            DIET DYS 2 Room service appropriate? No; Fluid consistency: Thin; Fluid restriction: 1200 mL Fluid  Diet effective now  Disposition Plan  :   Status is: Inpatient  Remains inpatient appropriate because:Inpatient level of care appropriate due to severity of illness  Dispo: The patient is from: SNF              Anticipated d/c is to: Home              Anticipated d/c date is: > 3 days              Patient currently is not medically stable to d/c.   Barriers to discharge: Hypoxia requiring O2 supplementation/complete 5 days of IV Remdesivir  Antimicorbials  :    Anti-infectives (From admission, onward)   Start     Dose/Rate Route Frequency Ordered Stop   02/21/20 1200  vancomycin (VANCOCIN) IVPB 1000 mg/200 mL premix        1,000 mg 200 mL/hr over 60 Minutes Intravenous Every M-W-F (Hemodialysis) 02/20/20 1959     02/21/20 1000  remdesivir 100 mg in sodium chloride 0.9 % 100 mL IVPB       "Followed by" Linked Group Details   100 mg 200 mL/hr over 30 Minutes Intravenous Daily 02/20/20 1632 02/25/20 0959   02/20/20 2000  rifampin (RIFADIN) capsule 300 mg        300 mg Oral Every 12 hours 02/20/20 1907     02/20/20 1730  remdesivir 200 mg in sodium chloride 0.9% 250 mL IVPB       "Followed by" Linked Group Details   200 mg 580 mL/hr over 30 Minutes Intravenous Once 02/20/20 1632 02/20/20 1913      Inpatient  Medications  Scheduled Meds: . (feeding supplement) PROSource Plus  30 mL Oral BID  . calcitRIOL  1.5 mcg Oral Q M,W,F-HD  . Chlorhexidine Gluconate Cloth  6 each Topical Q0600  . [START ON 02/23/2020] darbepoetin (ARANESP) injection - DIALYSIS  100 mcg Intravenous Q Wed-HD  . dexamethasone (DECADRON) injection  6 mg Intravenous Q24H  . heparin  5,000 Units Subcutaneous Q8H  . insulin aspart  0-6 Units Subcutaneous Q4H  . mouth rinse  15 mL Mouth Rinse BID  . midodrine  10 mg Oral TID WC  . multivitamin  1 tablet Oral QHS  . pantoprazole  40 mg Oral Q1200  . QUEtiapine  12.5 mg Oral QHS  . rifampin  300 mg Oral Q12H  . sevelamer carbonate  1,600 mg Oral TID WC  . sodium zirconium cyclosilicate  10 g Oral TID  . zinc sulfate  220 mg Oral Daily   Continuous Infusions: . sodium chloride    . sodium chloride    . sodium chloride Stopped (02/21/20 2227)  . remdesivir 100 mg in NS 100 mL 100 mg (02/22/20 0831)  . vancomycin Stopped (02/21/20 2330)   PRN Meds:.sodium chloride, sodium chloride, sodium chloride, acetaminophen **OR** acetaminophen, alteplase, chlorpheniramine-HYDROcodone, clonazePAM, guaiFENesin-dextromethorphan, heparin, lidocaine (PF), lidocaine-prilocaine, LORazepam, ondansetron **OR** ondansetron (ZOFRAN) IV, oxyCODONE, pentafluoroprop-tetrafluoroeth  Time Spent in minutes  35  See all Orders from today for further details   Oren Binet M.D on 02/22/2020 at 1:36 PM  To page go to [email or web address] - use universal password  [facility] -  Office  [phone]    Objective:   Vitals:   02/22/20 0732 02/22/20 0743 02/22/20 1208 02/22/20 1210  BP: (!) 83/44 (!) 110/44 (!) 93/46 (!) 106/47  Pulse: 87 89    Resp: 12 (!) 22  19  Temp: 98.5 F (36.9 C) 98.3 F (36.8 C) 98.3 F (36.8 C)  98.3 F (36.8 C)  TempSrc: Oral Oral Oral Oral  SpO2: 100% 100% 93% 94%  Weight:      Height:        Wt Readings from Last 3 Encounters:  02/22/20 86.9 kg  01/28/20  86.2 kg  01/24/20 89.4 kg     Intake/Output Summary (Last 24 hours) at 02/22/2020 1336 Last data filed at 02/22/2020 1300 Gross per 24 hour  Intake 995.16 ml  Output -143 ml  Net 1138.16 ml     Physical Exam Gen Exam: Lying comfortably in bed-follow simple commands-not in any distress. HEENT:atraumatic, normocephalic Chest: B/L clear to auscultation anteriorly CVS:S1S2 regular Abdomen:soft non tender, non distended Extremities:no edema Neurology: Has generalized weakness but no obvious focal deficits. Skin: no rash   Data Review:    CBC Recent Labs  Lab 02/20/20 1315 02/21/20 0017 02/21/20 0434 02/22/20 0837  WBC 4.7 4.5 5.1 5.1  HGB 8.5* 7.9* 8.0* 7.6*  HCT 26.7* 25.0* 24.8* 23.3*  PLT 181 175 201 238  MCV 81.7 80.1 77.7* 76.9*  MCH 26.0 25.3* 25.1* 25.1*  MCHC 31.8 31.6 32.3 32.6  RDW 17.8* 17.9* 17.8* 18.0*  LYMPHSABS 1.8  --  1.3 2.1  MONOABS 0.4  --  0.3 0.2  EOSABS 0.0  --  0.0 0.0  BASOSABS 0.0  --  0.0 0.0    Chemistries  Recent Labs  Lab 02/20/20 1315 02/21/20 0017 02/21/20 0434 02/22/20 0837  NA 131*  --  134* 134*  K 6.2*  --  5.4* 3.8  CL 99  --  102 98  CO2 15*  --  13* 18*  GLUCOSE 87  --  93 169*  BUN 82*  --  93* 53*  CREATININE 13.66* 14.40* 15.31* 10.29*  CALCIUM 7.9*  --  7.8* 7.7*  MG  --  2.0 2.1 2.0  AST 34  --  33 237*  ALT 19  --  18 74*  ALKPHOS 81  --  80 68  BILITOT 0.8  --  0.8 1.1   ------------------------------------------------------------------------------------------------------------------ Recent Labs    02/20/20 1315  TRIG 108    Lab Results  Component Value Date   HGBA1C 6.5 (H) 01/12/2020   ------------------------------------------------------------------------------------------------------------------ Recent Labs    02/20/20 0018  TSH 3.664   ------------------------------------------------------------------------------------------------------------------ Recent Labs    02/21/20 0018  02/21/20 0019 02/21/20 0434 02/22/20 0107  VITAMINB12  --  489  --   --   FOLATE  --  13.7  --   --   FERRITIN   < >  --  753* >7,500*   < > = values in this interval not displayed.    Coagulation profile Recent Labs  Lab 02/21/20 0434  INR 1.8*    Recent Labs    02/21/20 0434 02/22/20 0107  DDIMER 1.96* 8.28*    Cardiac Enzymes No results for input(s): CKMB, TROPONINI, MYOGLOBIN in the last 168 hours.  Invalid input(s): CK ------------------------------------------------------------------------------------------------------------------ No results found for: BNP  Micro Results Recent Results (from the past 240 hour(s))  SARS Coronavirus 2 by RT PCR (hospital order, performed in [facility] hospital lab) Nasopharyngeal Nasopharyngeal Swab     Status: Abnormal   Collection Time: 02/20/20  1:11 PM   Specimen: Nasopharyngeal Swab  Result Value Ref Range Status   SARS Coronavirus 2 POSITIVE (A) NEGATIVE Final    Comment: RESULT CALLED TO, READ BACK BY AND VERIFIED WITH: RN E ROSDEL [id number] AT 1539 BY CM (NOTE) SARS-CoV-2 target nucleic acids are DETECTED  SARS-CoV-2 RNA is generally detectable in upper respiratory specimens  during the acute phase of infection.  Positive results are indicative  of the presence of the identified virus, but do not rule out bacterial infection or co-infection with other pathogens not detected by the test.  Clinical correlation with patient history and  other diagnostic information is necessary to determine patient infection status.  The expected result is negative.  Fact Sheet for Patients:   StrictlyIdeas.no   Fact Sheet for Healthcare Providers:   BankingDealers.co.za    This test is not yet approved or cleared by the [place] FDA and  has been authorized for detection and/or diagnosis of SARS-CoV-2 by FDA under an Emergency Use Authorization (EUA).  This EUA will remain in effect  (meaning this test  can be used) for the duration of  the COVID-19 declaration under Section 564(b)(1) of the Act, 21 U.S.C. section 360-bbb-3(b)(1), unless the authorization is terminated or revoked sooner.  Performed at [facility], [place] [address] [place], [place] [address]   Blood Culture (routine x 2)     Status: None (Preliminary result)   Collection Time: 02/21/20 12:00 AM   Specimen: BLOOD RIGHT FOREARM  Result Value Ref Range Status   Specimen Description BLOOD RIGHT FOREARM  Final   Special Requests   Final    BOTTLES DRAWN AEROBIC AND ANAEROBIC Blood Culture adequate volume   Culture   Final    NO GROWTH 1 DAY Performed at [facility], [place] [address] [place], [place] [address]    Report Status PENDING  Incomplete  Culture, blood (Routine X 2) w Reflex to ID Panel     Status: None (Preliminary result)   Collection Time: 02/21/20 12:06 AM   Specimen: BLOOD RIGHT HAND  Result Value Ref Range Status   Specimen Description BLOOD RIGHT HAND  Final   Special Requests   Final    BOTTLES DRAWN AEROBIC AND ANAEROBIC Blood Culture adequate volume   Culture   Final    NO GROWTH 1 DAY Performed at [facility], [place] [address], [place], [place] [address]    Report Status PENDING  Incomplete  MRSA PCR Screening     Status: None   Collection Time: 02/21/20  2:05 AM   Specimen: Nasal Mucosa; Nasopharyngeal  Result Value Ref Range Status   MRSA by PCR NEGATIVE NEGATIVE Final    Comment:        The GeneXpert MRSA Assay (FDA approved for NASAL specimens only), is one component of a comprehensive MRSA colonization surveillance program. It is not intended to diagnose MRSA infection nor to guide or monitor treatment for MRSA infections. Performed at [facility], [place] [address], [place], [place] [address]   Culture, blood (Routine X 2) w Reflex to ID Panel     Status: None (Preliminary result)   Collection Time: 02/21/20  4:41 AM   Specimen: BLOOD   Result Value Ref Range Status   Specimen Description BLOOD RIGHT ANTECUBITAL  Final   Special Requests   Final    BOTTLES DRAWN AEROBIC AND ANAEROBIC Blood Culture adequate volume   Culture   Final    NO GROWTH 1 DAY Performed at [facility], [place] [address] [place], [place] [address]    Report Status PENDING  Incomplete  Culture, blood (routine x 2)     Status: None (Preliminary result)   Collection Time: 02/22/20  8:35 AM   Specimen: BLOOD  Result Value Ref Range Status   Specimen Description BLOOD RIGHT ANTECUBITAL  Final   Special Requests   Final    BOTTLES DRAWN AEROBIC ONLY Blood Culture results may not be optimal due to an inadequate volume of blood received in culture bottles   Culture   Final    NO GROWTH < 12 HOURS Performed at [place] [address], [place], [place] [address]    Report Status PENDING  Incomplete  Culture, blood (routine x 2)     Status: None (Preliminary result)   Collection Time: 02/22/20  8:37 AM   Specimen: BLOOD RIGHT ARM  Result Value Ref Range Status   Specimen Description BLOOD RIGHT ARM  Final   Special Requests   Final    BOTTLES DRAWN AEROBIC ONLY Blood Culture results may not be optimal due to an inadequate volume of blood received in culture bottles   Culture   Final    NO GROWTH < 12 HOURS Performed at [facility], [place] [address] [place], [place] [address]    Report Status PENDING  Incomplete    Radiology Reports CT Head Wo Contrast  Result Date: 02/20/2020 CLINICAL DATA:  Delirium.  COVID-19 positive.  Recent neck surgery. EXAM: CT HEAD WITHOUT CONTRAST TECHNIQUE: Contiguous axial images were obtained from the base of the skull through the vertex without intravenous contrast. COMPARISON:  January 28, 2020 FINDINGS: Brain: No subdural, epidural, or subarachnoid hemorrhage. A lacunar infarct in the right thalamus is stable and nonacute. Moderate white matter changes again identified. No acute cortical ischemia  or acute infarct noted. No mass effect or midline shift. Ventricles and sulci are unchanged. Cerebellum, brainstem, and basal cisterns are normal. Vascular: Calcified atherosclerosis in the intracranial carotids. Skull: Normal. Negative for fracture or focal lesion. Sinuses/Orbits: Mild scattered mucosal thickening in the ethmoid air cells and left maxillary sinus. No air-fluid levels. Mastoid air cells and middle ears are well aerated. Other: None. IMPRESSION: 1. No acute intracranial abnormalities. Chronic white matter changes. Chronic right thalamic lacunar infarct. 2. Mild sinus disease as above. Electronically Signed   By: Dorise Bullion III M.D   On: 02/20/2020 14:49   CT Head Wo Contrast  Result Date: 01/28/2020 CLINICAL DATA:  [place] after dialysis hit back of head EXAM: CT HEAD WITHOUT CONTRAST CT CERVICAL SPINE WITHOUT CONTRAST TECHNIQUE: Multidetector CT imaging of the head and cervical spine was performed following the standard protocol without intravenous contrast. Multiplanar CT image reconstructions of the cervical spine were also generated. COMPARISON:  Radiograph 01/06/2020, MRI 01/04/2020, CT brain and MRI brain 12/31/2019, CT brain 01/11/2020 FINDINGS: CT HEAD FINDINGS Brain: No acute territorial infarction, hemorrhage, or intracranial mass is visualized. Mild atrophy. Moderate hypodensity in the white matter consistent with chronic small vessel ischemic change. 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 Stable ventricle size Vascular: No hyperdense vessels.  Carotid vascular calcification. Skull: No fracture Sinuses/Orbits: No acute finding. Other: None CT CERVICAL SPINE FINDINGS Alignment: Reversal of cervical lordosis trace retrolisthesis C4 on C5. Facet alignment is maintained. Skull base and vertebrae: New erosive changes at the anterior arch of C1 and the dens of C2. There are also new erosive changes and sclerosis at the left atlanto occipital articulation. Soft tissues and  spinal canal: Marked prevertebral soft tissue swelling with possible prevertebral fluid anterior to C2, series 5, image 38, though difficult to further characterize without contrast. Abnormal epidural density posterior to C2. Disc levels: Patient is status post posterior  rods C4 through T1 with transpedicular screws at C4, C5, C6 and T1. Advanced degenerative changes at C6-C7 and C7-T1 with mild degenerative changes elsewhere in the cervical spine. Upper chest: Negative. Other: None IMPRESSION: 1. No CT evidence for acute intracranial abnormality. Atrophy and chronic small vessel ischemic change of the white matter. 2. New erosive changes at the anterior arch of C1 and the dens of C2, concerning for osteomyelitis. Erosive change also present at the left atlantooccipital articulation, concerning for infection. Marked prevertebral soft tissue swelling with possible prevertebral fluid collection/abscess though further characterisation limited without contrast. Abnormal epidural density at C2, probably representing residual epidural abscess. MRI evaluation is recommended. 3. Posterior rods C4 through T1. Advanced degenerative changes at C6-C7 and C7-T1. Critical Value/emergent results were called by telephone at the time of interpretation on 01/28/2020 at 8:21 pm to provider [facility] , who verbally acknowledged these results. Electronically Signed   By: Donavan Foil M.D.   On: 01/28/2020 20:21   CT Cervical Spine Wo Contrast  Result Date: 02/20/2020 CLINICAL DATA:  COVID positive, recent neck surgery EXAM: CT CERVICAL SPINE WITHOUT CONTRAST TECHNIQUE: Multidetector CT imaging of the cervical spine was performed without intravenous contrast. Multiplanar CT image reconstructions were also generated. COMPARISON:  CT cervical spine, 01/28/2020 FINDINGS: Alignment: Normal. Skull base and vertebrae: No acute fracture. No primary bone lesion. Erosive change of the dens and adjacent transverse process of C1 are not  significantly changed compared to prior examination. Soft tissues and spinal canal: Assessment of the soft tissues is somewhat limited by metallic streak artifact and lack of intravenous contrast; within this limitation, no unexpected fluid collection overlying the laminectomy site. Prevertebral soft tissue edema appears reduced compared to prior examination. No visible canal hematoma. Disc levels: Status post posterior laminectomy and fusion of C4 through T1, with moderate disc space height loss and osteophytosis of C6-C7 and C7-T1. Upper chest: Negative. Other: None. IMPRESSION: 1. Redemonstrated postoperative findings status post posterior laminectomy and fusion of C4 through T1, with moderate disc space height loss and osteophytosis of C6-C7 and C7-T1. 2. Prevertebral soft tissue edema appears reduced compared to prior examination. 3. Postoperative fluid collection overlying laminectomy site does not appear significantly changed compared to prior examination. 4. Assessment of the soft tissues and epidural space is generally limited on noncontrast CT and additionally limited by metallic streak artifact; contrast enhanced MRI is the test of choice for evaluation of edema, fluid collections, the epidural space, and discitis osteomyelitis if clinically suspected in the postoperative setting. Electronically Signed   By: Eddie Candle M.D.   On: 02/20/2020 14:53   CT Cervical Spine Wo Contrast  Result Date: 01/28/2020 CLINICAL DATA:  [place] after dialysis hit back of head EXAM: CT HEAD WITHOUT CONTRAST CT CERVICAL SPINE WITHOUT CONTRAST TECHNIQUE: Multidetector CT imaging of the head and cervical spine was performed following the standard protocol without intravenous contrast. Multiplanar CT image reconstructions of the cervical spine were also generated. COMPARISON:  Radiograph 01/06/2020, MRI 01/04/2020, CT brain and MRI brain 12/31/2019, CT brain 01/11/2020 FINDINGS: CT HEAD FINDINGS Brain: No acute territorial  infarction, hemorrhage, or intracranial mass is visualized. Mild atrophy. Moderate hypodensity in the white matter consistent with chronic small vessel ischemic change. 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 Stable ventricle size Vascular: No hyperdense vessels.  Carotid vascular calcification. Skull: No fracture Sinuses/Orbits: No acute finding. Other: None CT CERVICAL SPINE FINDINGS Alignment: Reversal of cervical lordosis trace retrolisthesis C4 on C5. Facet alignment is maintained.  Skull base and vertebrae: New erosive changes at the anterior arch of C1 and the dens of C2. There are also new erosive changes and sclerosis at the left atlanto occipital articulation. Soft tissues and spinal canal: Marked prevertebral soft tissue swelling with possible prevertebral fluid anterior to C2, series 5, image 38, though difficult to further characterize without contrast. Abnormal epidural density posterior to C2. Disc levels: Patient is status post posterior rods C4 through T1 with transpedicular screws at C4, C5, C6 and T1. Advanced degenerative changes at C6-C7 and C7-T1 with mild degenerative changes elsewhere in the cervical spine. Upper chest: Negative. Other: None IMPRESSION: 1. No CT evidence for acute intracranial abnormality. Atrophy and chronic small vessel ischemic change of the white matter. 2. New erosive changes at the anterior arch of C1 and the dens of C2, concerning for osteomyelitis. Erosive change also present at the left atlantooccipital articulation, concerning for infection. Marked prevertebral soft tissue swelling with possible prevertebral fluid collection/abscess though further characterisation limited without contrast. Abnormal epidural density at C2, probably representing residual epidural abscess. MRI evaluation is recommended. 3. Posterior rods C4 through T1. Advanced degenerative changes at C6-C7 and C7-T1. Critical Value/emergent results were called by telephone at the time  of interpretation on 01/28/2020 at 8:21 pm to provider [facility] , who verbally acknowledged these results. Electronically Signed   By: Donavan Foil M.D.   On: 01/28/2020 20:21   MR CERVICAL SPINE WO CONTRAST  Result Date: 02/22/2020 CLINICAL DATA:  69 year old male with history of dorsal cervical epidural abscess, status post operative drainage and decompression last month on 01/06/2020. Follow-up MRI 01/29/2020 with new left occipital condyle, anterior C1 and odontoid marrow edema and erosion compatible with skull base osteomyelitis. End stage renal disease on dialysis. Confusion. Positive COVID-19. EXAM: MRI CERVICAL SPINE WITHOUT CONTRAST TECHNIQUE: Multiplanar, multisequence MR imaging of the cervical spine was performed. No intravenous contrast was administered. COMPARISON:  Recent CT head and cervical spine 02/20/2020. Postoperative MRI 01/29/2020. Preoperative MRI FINDINGS: The examination had to be discontinued just prior to completion due to confusion, agitation. Only axial GRE imaging was not obtained. Alignment: Straightening and mild reversal of cervical lordosis remains improved since 01/04/2020. Vertebrae: Abnormal marrow edema and/or T2 and STIR hyperintense soft tissue surrounding the odontoid (series 3, image 7) which has been partially eroded along with the adjacent clivus and anterior C1 ring as seen on the recent CT. Mild marrow edema in both occipital condyles, greater on the left. Faint marrow edema also in the C2-C3 facets, mostly on the right (series 3, image 2). Overall the appearance has not progressed since 01/22/2020. Sequelae of posterior decompression and fusion from C3 through T1. Mild hardware susceptibility artifact. No other cervical or upper thoracic marrow edema or acute osseous abnormality. Cord: Suboptimal cord detail due to motion and hardware susceptibility. No definite cervical or upper thoracic spinal cord signal abnormality. Normalized epidural space since  August. Posterior Fossa, vertebral arteries, paraspinal tissues: Major vascular flow voids are preserved in the neck. Postoperative changes to the posterior neck soft tissues with no adverse features. Negative visible lung apices. Cervicomedullary junction and visible posterior fossa are stable and within normal limits. Disc levels: C2-C3: Improved thecal sac patency since 01/04/2020. Foraminal endplate spurring and moderate bilateral facet degeneration. Mild residual spinal stenosis. Mild bilateral C3 foraminal stenosis. C3-C4: Chronic central disc protrusion (series 2, image 7) with mild facet and ligament flavum hypertrophy. Improved thecal sac patency since 01/04/2020 but residual mild spinal stenosis and mild cord mass  effect. Moderate bilateral C4 foraminal stenosis. C4-C5 through T1-T2: Posterior decompression with no significant spinal stenosis despite residual disc and endplate degeneration at some levels. There is mild to moderate bilateral C8 foraminal stenosis related to disc, endplate and residual facet hypertrophy at C7-T1. IMPRESSION: 1. 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 Possible involvement of the right C2-C3 facet joint also. Associated erosion of bone since 01/28/2020 as demonstrated by CT two days ago. But on MRI no other significant progression is identified since August; no new levels of involvement. 2. Resolved posterior epidural abscess following surgery last month. Satisfactorily decompressed levels C4-C5 through T1-T2. 3. Improved thecal sac patency but mild residual degenerative spinal stenosis at both C2-C3 and C3-C4. No definite spinal cord signal abnormality. Electronically Signed   By: Genevie Ann M.D.   On: 02/22/2020 11:48   MR CERVICAL SPINE WO CONTRAST  Result Date: 01/29/2020 CLINICAL DATA:  Initial evaluation for epidural abscess. EXAM: MRI CERVICAL SPINE WITHOUT CONTRAST TECHNIQUE: Multiplanar, multisequence MR imaging of the cervical spine was performed.  No intravenous contrast was administered. COMPARISON:  Prior CT from 01/28/2020 as well as prior MRI from 01/04/2020. FINDINGS: Alignment: Examination moderately degraded by motion artifact. Straightening with smooth reversal of the normal cervical lordosis. Trace retrolisthesis of C4 on C5, with trace anterolisthesis of C5 on C6. Vertebrae: Postoperative changes from recent C4 through T2 laminectomy with C4 through T1 posterior arthrodesis for debridement of previously identified epidural abscess. Vertebral body height maintained without interval fracture. Abnormal marrow edema with erosive changes seen involving the left greater than right atlantooccipital articulations, concerning for osteomyelitis and septic arthritis. Progressive erosive changes about the dens and anterior arch of C1, also consistent with infection. Changes are new/progressed as compared to prior MRI. Surrounding soft tissue swelling and edema extending from the skull base to approximately C6, consistent with associated phlegmon. Probable small collections seen involving the prevertebral soft tissues at the level of C2, largest of which measures 10 mm on the left (series 1033, image 46). Circumferential epidural collection extending from the foramen magnum to approximately C3-4 consistent with persistent epidural abscess (series 10, image 30 on sagittal view, series 1033, image 44 on axial view). Resultant moderate diffuse spinal stenosis at these levels. No frank cord impingement for compression. Distally, there is increased fluid signal intensity seen within the C5-6, C6-7, and C7-T1 interspace is, new from prior, and suspicious for associated discitis (series 7, image 8). No significant erosion or edema within the adjacent endplates. Cord: No definite cord signal abnormality to suggest myelopathy or edema on this motion degraded exam. Posterior Fossa, vertebral arteries, paraspinal tissues: Remote right pontine lacunar infarct noted.  Associated age-related cerebral atrophy. Extensive edema throughout the posterior paraspinous soft tissues extending from the skull base to approximately T2, which could be related to postoperative changes and/or soft tissue infection. Small collection measuring approximately 1.6 x 1.3 x 7.6 cm seen along the midline incision (series 9, image 40). Prevertebral swelling and phlegmon with superimposed small prevertebral collections as above. Normal flow voids preserved within the vertebral arteries bilaterally. Disc levels: C1-2: Circumferential epidural abscess with effacement of the thecal sac. Resultant moderate to severe spinal stenosis without frank cord compression (series 1033, image 44). C2-C3: No significant disc bulge. Circumferential epidural abscess with resultant moderate spinal stenosis. C3-C4: Diffuse disc bulge with bilateral uncovertebral hypertrophy. Resultant mild-to-moderate spinal stenosis. Moderate right with mild left C4 foraminal narrowing. C4-C5: Small central disc protrusion indents the ventral thecal sac (series 1033, image 68).  Prior posterior decompression and fusion. No significant spinal stenosis or cord deformity. Foramina appear grossly patent. C5-C6: Mild diffuse disc bulge with uncovertebral hypertrophy. Prior posterior decompression with fusion. No residual spinal stenosis. Foramina appear grossly patent. C6-C7: Degenerative intervertebral disc space narrowing with diffuse disc osteophyte, slightly asymmetric to the left. Disc osteophyte indents and partially effaces the left ventral thecal sac. Prior posterior decompression and fusion. No significant spinal stenosis. Severe left C7 foraminal narrowing. No significant right foraminal stenosis. C7-T1: Mild disc bulge with uncovertebral hypertrophy. Prior posterior decompression with fusion. No residual spinal stenosis. Foramina appear grossly patent. IMPRESSION: 1. Postoperative changes from recent C4-T2 laminectomy with C4-T1  posterior arthrodesis for debridement of previously identified epidural abscess. 2. Abnormal marrow edema with erosive changes involving the atlantooccipital and C1-2 articulations, consistent with osteomyelitis and septic arthritis. 3. Persistent circumferential epidural abscess extending from the foramen magnum to approximately C3-4, with resultant moderate to severe spinal stenosis. No frank cord compression or cord signal changes. 4. Extensive edema and phlegmonous change involving the prevertebral soft tissues extending from the clivus to approximately C6, consistent with infection. Superimposed small prevertebral collections measuring up to 10 mm at the level of C2 as above. Edema involving the posterior paraspinous soft tissues could reflect postoperative change and/or infection. Superimposed 1.6 x 1.3 x 7.6 cm collection along the posterior midline incision. 5. Increased fluid signal intensity within the C5-6, C6-7, and C7-T1 interspace, nonspecific, but could reflect associated discitis. Electronically Signed   By: Jeannine Boga M.D.   On: 01/29/2020 01:28   DG Chest Port 1 View  Result Date: 02/20/2020 CLINICAL DATA:  COVID EXAM: PORTABLE CHEST 1 VIEW COMPARISON:  01/12/2020 FINDINGS: Interval extubation. Interval placement of large-bore right neck vascular catheter, tip position near the superior cavoatrial junction. The heart size and mediastinal contours are within normal limits. Both lungs are clear. The visualized skeletal structures are unremarkable. IMPRESSION: 1. Interval placement of large-bore right neck vascular catheter, tip position near the superior cavoatrial junction. 2. No acute abnormality of the lungs in AP portable projection. Electronically Signed   By: Eddie Candle M.D.   On: 02/20/2020 13:43   DG Chest Port 1V same Day  Result Date: 02/21/2020 CLINICAL DATA:  Shortness of breath, COVID EXAM: PORTABLE CHEST 1 VIEW COMPARISON:  02/20/2020 FINDINGS: Right dialysis  catheter remains in place, unchanged. Heart is normal size. No confluent airspace opacities or effusions. No acute bony abnormality. IMPRESSION: No acute cardiopulmonary disease. Electronically Signed   By: Rolm Baptise M.D.   On: 02/21/2020 13:03

## 2020-02-22 NOTE — Progress Notes (Signed)
   02/22/20 0000  Assess: MEWS Score  Temp (!) 100.9 F (38.3 C)  BP (!) 84/42  Pulse Rate (!) 109  ECG Heart Rate (!) 106  Resp 20  SpO2 90 %  O2 Device Room Air  Patient Activity (if Appropriate) In bed  Assess: MEWS Score  MEWS Temp 1  MEWS Systolic 1  MEWS Pulse 1  MEWS RR 0  MEWS LOC 0  MEWS Score 3  MEWS Score Color Yellow  Treat  Pain Score Asleep   Midodrine given PO at 2350, so no action taken at this time. Patient is asymptomatic. Will continue to monitor.

## 2020-02-22 NOTE — Progress Notes (Signed)
Hypoglycemic Event  CBG: 61  Treatment: D50 50 mL (25 gm)  Symptoms: None  Follow-up CBG: Time:807 CBG Result:127  Possible Reasons for Event: Unknown  Comments/MD notified:Dr. Ghimire notified. No new orders at this time. Will continue to monitor.   Jeanella Craze

## 2020-02-22 NOTE — Progress Notes (Signed)
[facility] Progress Note  Subjective: pt seen in room, had HD yest. Remains confused in room today. No c/o.   Vitals:   02/22/20 0732 02/22/20 0743 02/22/20 1208 02/22/20 1210  BP: (!) 83/44 (!) 110/44 (!) 93/46 (!) 106/47  Pulse: 87 89    Resp: 12 (!) 22    Temp: 98.5 F (36.9 C) 98.3 F (36.8 C) 98.3 F (36.8 C) 98.3 F (36.8 C)  TempSrc: Oral Oral Oral Oral  SpO2: 100% 100% 93% 94%  Weight:      Height:        Exam:   alert, nad , chron ill appearing  lethargic, snoring, arouses then falls back asleep  poor skin turgor and dry mouth  no jvd  Chest cta bilat  Cor reg no RG  Abd soft ntnd no ascites  GU condom cath   Rectal tube in place   Ext no LE edema   Groggy, confused, awakens and responds but disoriented     OP HD: [facility] MWF   4h 84min  87kg  2/2 bath  TDC / L AVF  Hep none  -Mircera 150 mcg IV q 2 weeks (last dose 02/09/2020) -Venofer 100 mg IV X 8 doses (not started yet) -Calcitriol 1.5 mcg PO TIW  Assessment/ Plan: 1. Hyperkalemia/Uremia- in pt who signs off early on most HD session. Had HD yesterday, next HD tomorrow. Will need full times as creat still >10.  2. AMS - multifactorial w/ missed Hd time causing uremia +/- medication side effects+ covid/ fevers. Head CT neg. MRI neck today.  3. COVID 19-recent diagnosis. Tested positive at [facility] 09/18 and again here. CXR unremarkable. Remdesivir per protocol. Per primary 4. 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 Not sure is he has followed up with ID as ordered. 5.  ESRD - MWF HD. Has TDC and LUA AVF, has been using AVF, not sure if he is using consistently. Per HD notes, some issues with cannulation. Continue using AVF here as can tolerate, 16g needles 6.  Hypertension/volume - BP's soft, looks dry on exam, at dry wt. BP lower than usual. Hold home BP meds add back as needed. Keep even w/ hd  tomorrow.  7.  Anemia  - HGB 8.5. ESA due this week. Ordered darbe 100 q wed to start tomorrow. Hold OP Fe load.  8.  Metabolic bone disease -Continue binders, VDRA 9.  Nutrition -NPO at present.  10.  DM-per primary     Kelly Splinter 02/22/2020, 12:42 PM   Recent Labs  Lab 02/21/20 0434 02/22/20 0837  K 5.4* 3.8  BUN 93* 53*  CREATININE 15.31* 10.29*  CALCIUM 7.8* 7.7*  PHOS 10.4* 8.3*  HGB 8.0* 7.6*   Inpatient medications: . (feeding supplement) PROSource Plus  30 mL Oral BID  . calcitRIOL  1.5 mcg Oral Q M,W,F-HD  . Chlorhexidine Gluconate Cloth  6 each Topical Q0600  . dexamethasone (DECADRON) injection  6 mg Intravenous Q24H  . heparin  5,000 Units Subcutaneous Q8H  . insulin aspart  0-6 Units Subcutaneous Q4H  . mouth rinse  15 mL Mouth Rinse BID  . midodrine  10 mg Oral TID WC  . multivitamin  1 tablet Oral QHS  . pantoprazole  40 mg Oral Q1200  . QUEtiapine  12.5 mg Oral QHS  . rifampin  300 mg Oral Q12H  . sevelamer carbonate  1,600 mg Oral TID  WC  . sodium zirconium cyclosilicate  10 g Oral TID  . zinc sulfate  220 mg Oral Daily   . sodium chloride    . sodium chloride    . sodium chloride Stopped (02/21/20 2227)  . remdesivir 100 mg in NS 100 mL 100 mg (02/22/20 0831)  . vancomycin Stopped (02/21/20 2330)   sodium chloride, sodium chloride, sodium chloride, acetaminophen **OR** acetaminophen, alteplase, chlorpheniramine-HYDROcodone, clonazePAM, guaiFENesin-dextromethorphan, heparin, lidocaine (PF), lidocaine-prilocaine, LORazepam, ondansetron **OR** ondansetron (ZOFRAN) IV, oxyCODONE, pentafluoroprop-tetrafluoroeth

## 2020-02-22 NOTE — Progress Notes (Addendum)
Floor coverage   Hypotensive with systolic in the 60V after dialysis. Febrile with temperature 102.5. Mildly tachycardic with heart rate in the 90s to low 100s. Currently satting well on room air. Awake and alert, no distress. Chest x-ray done yesterday showing no active cardiopulmonary disease.  -Small fluid bolus ordered, continue to monitor blood pressure closely. Patient is in the progressive care unit. -Continue midodrine -Repeat CBC, blood cultures, lactate, procalcitonin, cortisol -Already on IV vancomycin and rifampin due to history of MRSA bacteremia with epidural abscess requiring C4-T2 laminectomy. C-spine MRI pending.

## 2020-02-23 ENCOUNTER — Inpatient Hospital Stay (HOSPITAL_COMMUNITY): Payer: No Typology Code available for payment source

## 2020-02-23 DIAGNOSIS — G9341 Metabolic encephalopathy: Secondary | ICD-10-CM

## 2020-02-23 DIAGNOSIS — U071 COVID-19: Secondary | ICD-10-CM | POA: Diagnosis not present

## 2020-02-23 DIAGNOSIS — R7989 Other specified abnormal findings of blood chemistry: Secondary | ICD-10-CM | POA: Diagnosis not present

## 2020-02-23 DIAGNOSIS — R7401 Elevation of levels of liver transaminase levels: Secondary | ICD-10-CM | POA: Diagnosis not present

## 2020-02-23 DIAGNOSIS — G062 Extradural and subdural abscess, unspecified: Secondary | ICD-10-CM | POA: Diagnosis not present

## 2020-02-23 DIAGNOSIS — K7200 Acute and subacute hepatic failure without coma: Secondary | ICD-10-CM

## 2020-02-23 LAB — HEPATITIS PANEL, ACUTE
HCV Ab: REACTIVE — AB
Hep A IgM: NONREACTIVE
Hep B C IgM: NONREACTIVE
Hepatitis B Surface Ag: NONREACTIVE

## 2020-02-23 LAB — GLUCOSE, CAPILLARY
Glucose-Capillary: 102 mg/dL — ABNORMAL HIGH (ref 70–99)
Glucose-Capillary: 12 mg/dL — CL (ref 70–99)
Glucose-Capillary: 17 mg/dL — CL (ref 70–99)
Glucose-Capillary: 25 mg/dL — CL (ref 70–99)
Glucose-Capillary: 403 mg/dL — ABNORMAL HIGH (ref 70–99)
Glucose-Capillary: 421 mg/dL — ABNORMAL HIGH (ref 70–99)
Glucose-Capillary: 436 mg/dL — ABNORMAL HIGH (ref 70–99)
Glucose-Capillary: 574 mg/dL (ref 70–99)
Glucose-Capillary: 65 mg/dL — ABNORMAL LOW (ref 70–99)
Glucose-Capillary: 67 mg/dL — ABNORMAL LOW (ref 70–99)
Glucose-Capillary: 68 mg/dL — ABNORMAL LOW (ref 70–99)
Glucose-Capillary: 88 mg/dL (ref 70–99)
Glucose-Capillary: >600 mg/dL (ref 70–99)
Glucose-Capillary: >600 mg/dL (ref 70–99)
Glucose-Capillary: >600 mg/dL (ref 70–99)

## 2020-02-23 LAB — CBC
HCT: 24 % — ABNORMAL LOW (ref 39.0–52.0)
Hemoglobin: 7.8 g/dL — ABNORMAL LOW (ref 13.0–17.0)
MCH: 26 pg (ref 26.0–34.0)
MCHC: 32.5 g/dL (ref 30.0–36.0)
MCV: 80 fL (ref 80.0–100.0)
Platelets: 364 10*3/uL (ref 150–400)
RBC: 3 MIL/uL — ABNORMAL LOW (ref 4.22–5.81)
RDW: 18.6 % — ABNORMAL HIGH (ref 11.5–15.5)
WBC: 15.7 10*3/uL — ABNORMAL HIGH (ref 4.0–10.5)
nRBC: 0.8 % — ABNORMAL HIGH (ref 0.0–0.2)

## 2020-02-23 LAB — CBC WITH DIFFERENTIAL/PLATELET
Abs Immature Granulocytes: 0.07 10*3/uL (ref 0.00–0.07)
Basophils Absolute: 0 10*3/uL (ref 0.0–0.1)
Basophils Relative: 0 %
Eosinophils Absolute: 0 10*3/uL (ref 0.0–0.5)
Eosinophils Relative: 0 %
HCT: 25.7 % — ABNORMAL LOW (ref 39.0–52.0)
Hemoglobin: 8.6 g/dL — ABNORMAL LOW (ref 13.0–17.0)
Immature Granulocytes: 1 %
Lymphocytes Relative: 22 %
Lymphs Abs: 1.7 10*3/uL (ref 0.7–4.0)
MCH: 25.8 pg — ABNORMAL LOW (ref 26.0–34.0)
MCHC: 33.5 g/dL (ref 30.0–36.0)
MCV: 77.2 fL — ABNORMAL LOW (ref 80.0–100.0)
Monocytes Absolute: 0.4 10*3/uL (ref 0.1–1.0)
Monocytes Relative: 5 %
Neutro Abs: 5.5 10*3/uL (ref 1.7–7.7)
Neutrophils Relative %: 72 %
Platelets: 309 10*3/uL (ref 150–400)
RBC: 3.33 MIL/uL — ABNORMAL LOW (ref 4.22–5.81)
RDW: 18 % — ABNORMAL HIGH (ref 11.5–15.5)
WBC: 7.6 10*3/uL (ref 4.0–10.5)
nRBC: 0.4 % — ABNORMAL HIGH (ref 0.0–0.2)

## 2020-02-23 LAB — POCT I-STAT 7, (LYTES, BLD GAS, ICA,H+H)
Acid-base deficit: 10 mmol/L — ABNORMAL HIGH (ref 0.0–2.0)
Bicarbonate: 15.2 mmol/L — ABNORMAL LOW (ref 20.0–28.0)
Calcium, Ion: 1.03 mmol/L — ABNORMAL LOW (ref 1.15–1.40)
HCT: 24 % — ABNORMAL LOW (ref 39.0–52.0)
Hemoglobin: 8.2 g/dL — ABNORMAL LOW (ref 13.0–17.0)
O2 Saturation: 99 %
Potassium: 3.8 mmol/L (ref 3.5–5.1)
Sodium: 128 mmol/L — ABNORMAL LOW (ref 135–145)
TCO2: 16 mmol/L — ABNORMAL LOW (ref 22–32)
pCO2 arterial: 30.9 mmHg — ABNORMAL LOW (ref 32.0–48.0)
pH, Arterial: 7.298 — ABNORMAL LOW (ref 7.350–7.450)
pO2, Arterial: 153 mmHg — ABNORMAL HIGH (ref 83.0–108.0)

## 2020-02-23 LAB — MAGNESIUM
Magnesium: 2.2 mg/dL (ref 1.7–2.4)
Magnesium: 1.6 mg/dL — ABNORMAL LOW (ref 1.7–2.4)

## 2020-02-23 LAB — TRIGLYCERIDES: Triglycerides: 127 mg/dL (ref <150)

## 2020-02-23 LAB — COMPREHENSIVE METABOLIC PANEL
ALT: 717 U/L — ABNORMAL HIGH (ref 0–44)
AST: 2574 U/L — ABNORMAL HIGH (ref 15–41)
Albumin: 2 g/dL — ABNORMAL LOW (ref 3.5–5.0)
Alkaline Phosphatase: 66 U/L (ref 38–126)
Anion gap: 24 — ABNORMAL HIGH (ref 5–15)
BUN: 22 mg/dL (ref 8–23)
CO2: 15 mmol/L — ABNORMAL LOW (ref 22–32)
Calcium: 8 mg/dL — ABNORMAL LOW (ref 8.9–10.3)
Chloride: 90 mmol/L — ABNORMAL LOW (ref 98–111)
Creatinine, Ser: 5.95 mg/dL — ABNORMAL HIGH (ref 0.61–1.24)
GFR calc Af Amer: 10 mL/min — ABNORMAL LOW (ref >60)
GFR calc non Af Amer: 9 mL/min — ABNORMAL LOW (ref >60)
Glucose, Bld: 723 mg/dL (ref 70–99)
Potassium: 3.6 mmol/L (ref 3.5–5.1)
Sodium: 129 mmol/L — ABNORMAL LOW (ref 135–145)
Total Bilirubin: 1 mg/dL (ref 0.3–1.2)
Total Protein: 6.2 g/dL — ABNORMAL LOW (ref 6.5–8.1)

## 2020-02-23 LAB — D-DIMER, QUANTITATIVE: D-Dimer, Quant: 3.75 ug/mL-FEU — ABNORMAL HIGH (ref 0.00–0.50)

## 2020-02-23 LAB — PHOSPHORUS: Phosphorus: 9.5 mg/dL — ABNORMAL HIGH (ref 2.5–4.6)

## 2020-02-23 LAB — C-REACTIVE PROTEIN: CRP: 10 mg/dL — ABNORMAL HIGH (ref <1.0)

## 2020-02-23 LAB — AMMONIA: Ammonia: 32 umol/L (ref 9–35)

## 2020-02-23 LAB — FERRITIN: Ferritin: >7500 ng/mL — ABNORMAL HIGH (ref 24–336)

## 2020-02-23 IMAGING — DX DG CHEST 1V PORT
1 series · 1 of 1 positions shown · non-contrast
Comparison: [DATE]

CLINICAL DATA: Encounter for central line placement

EXAM:
PORTABLE CHEST 1 VIEW

[chest ap]
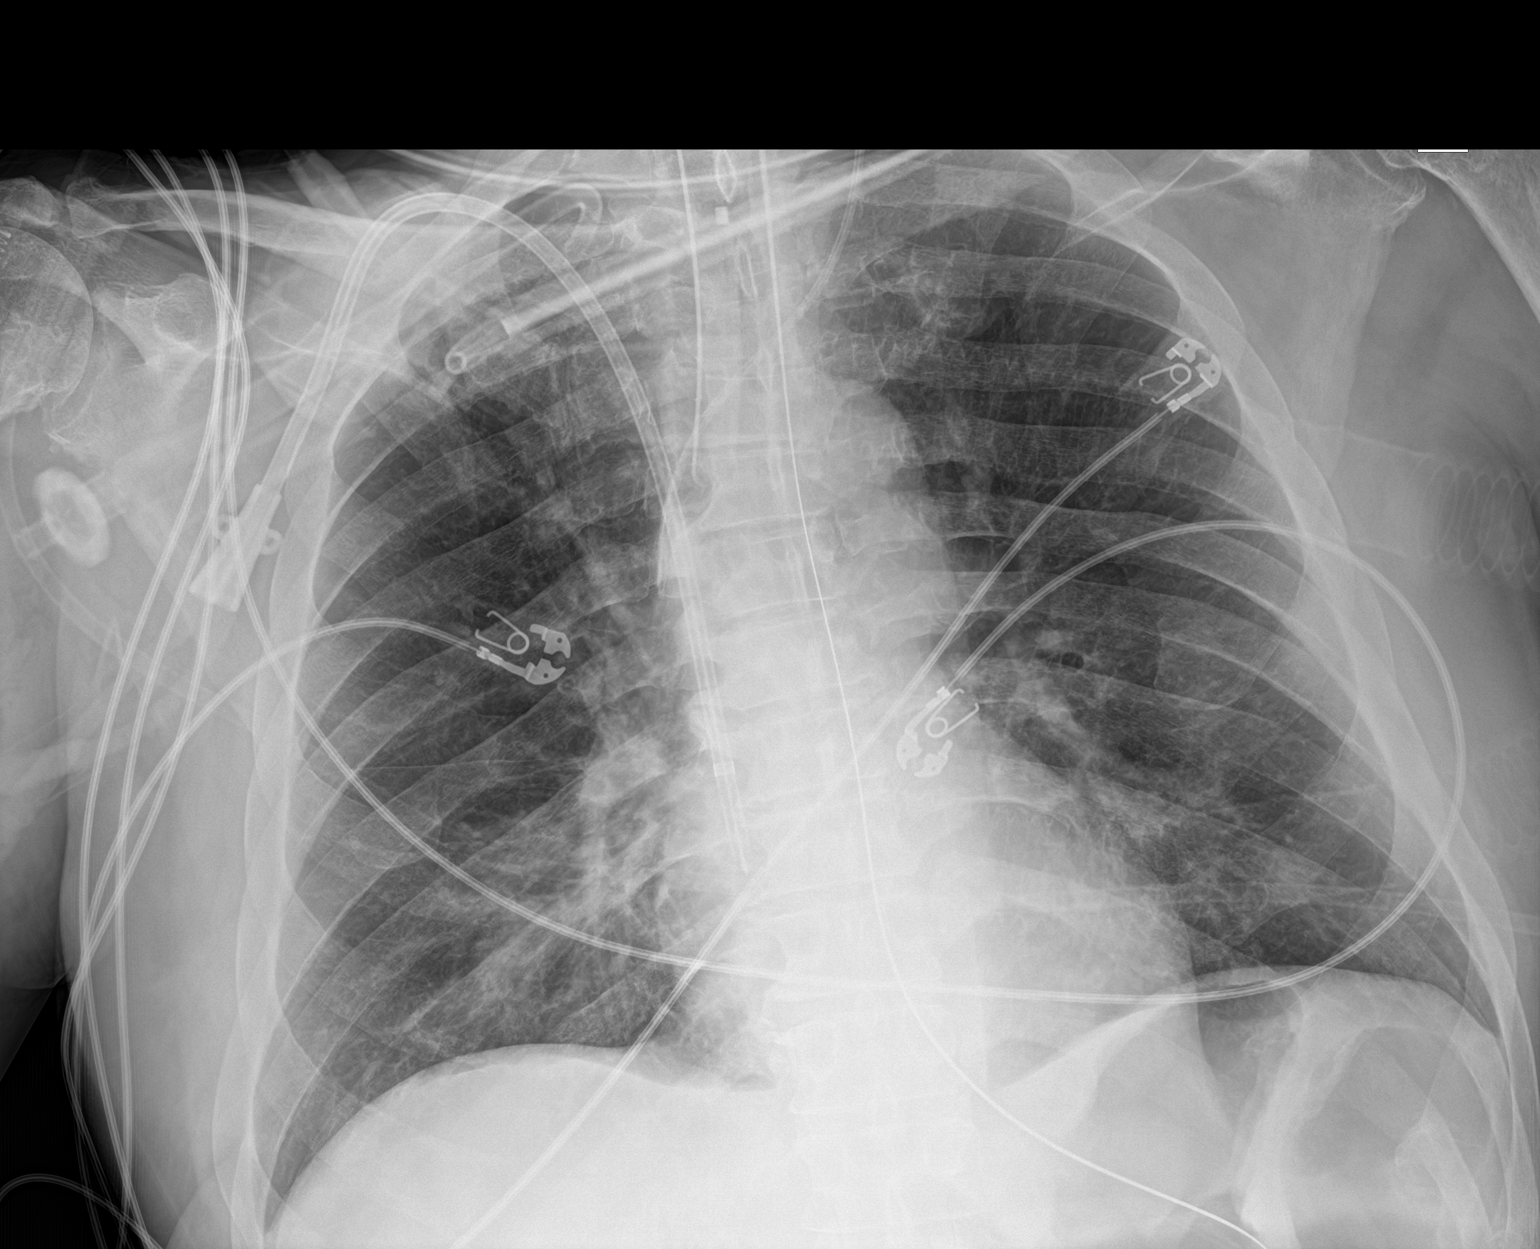

[1 of 1 positions shown; findings below may reference images not displayed]

FINDINGS: Endotracheal tube approximately 2 cm above the carina. NG tube
enters the stomach with the tip not visualized.

Right jugular central venous catheter tip in the right atrium
unchanged. New left jugular catheter with the tip in the proximal
SVC.

No pneumothorax. Mild right lower lobe atelectasis. Negative for
edema or effusion
IMPRESSION: Left jugular central venous catheter tip in the SVC. No
pneumothorax.

Mild right lower lobe atelectasis.

Endotracheal tube 2 cm above the carina.

## 2020-02-23 IMAGING — US US ABDOMEN LIMITED
1 series · 14 of 25 positions shown · non-contrast
Comparison: None.

CLINICAL DATA: Transaminitis

EXAM:
ULTRASOUND ABDOMEN LIMITED RIGHT UPPER QUADRANT

[Series 1: us abdomen limited ruq · 14 of 94 slices shown]
[im 1/94]
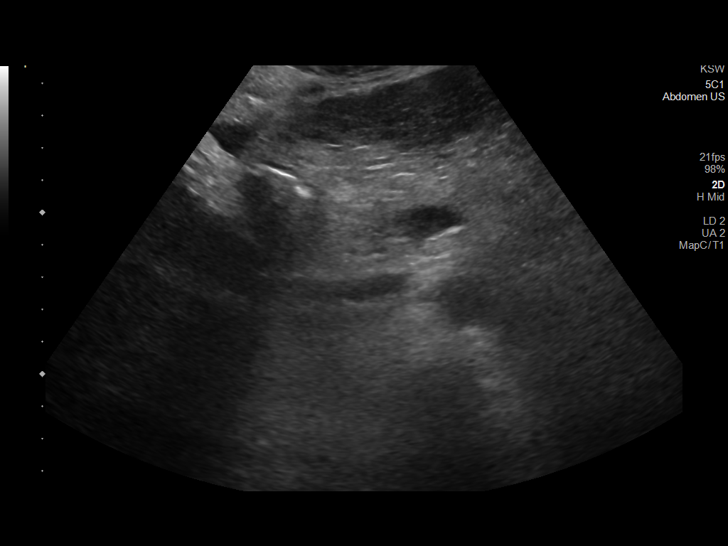
[im 8/94]
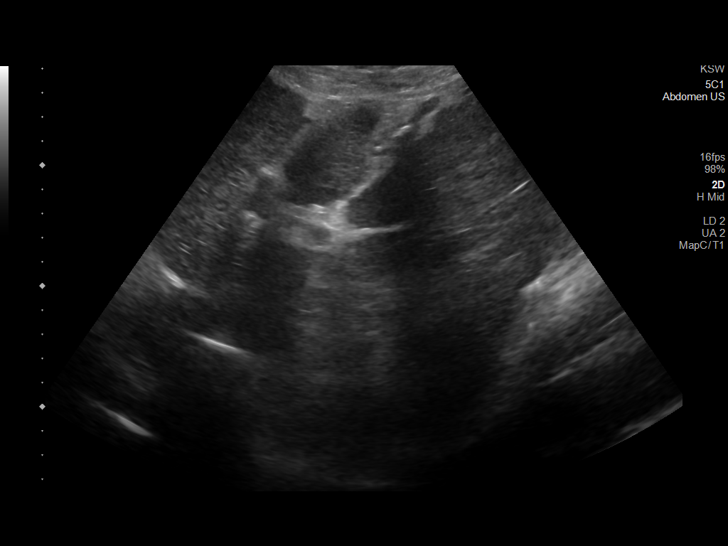
[im 16/94]
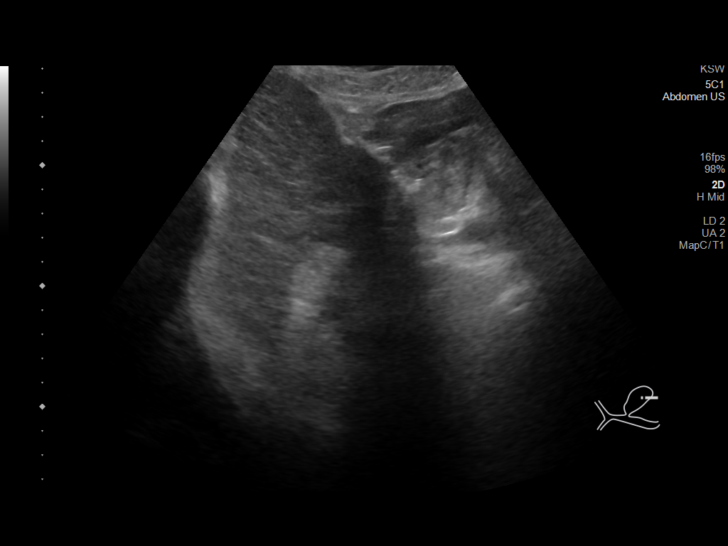
[im 24/94]
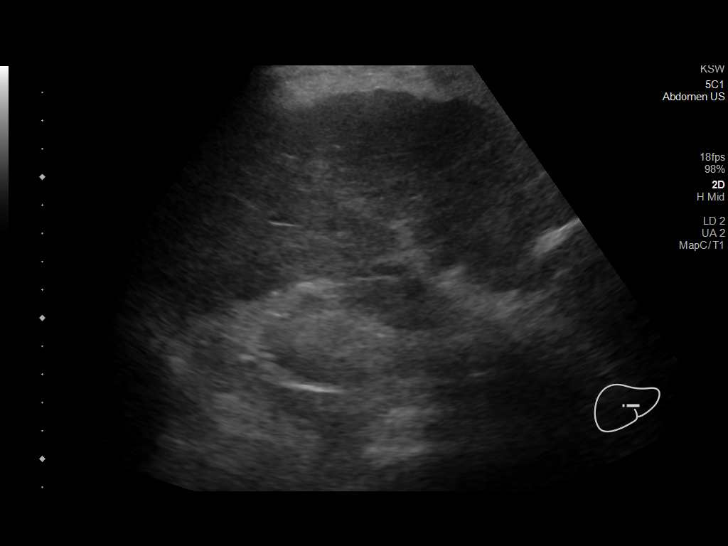
[im 32/94]
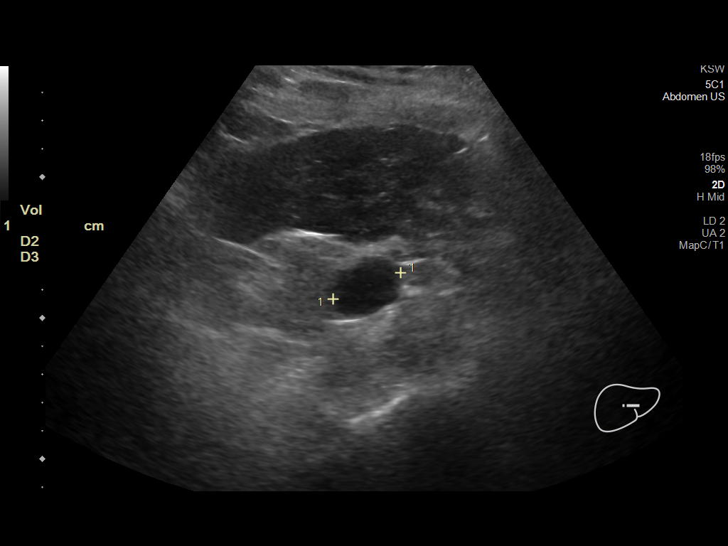
[im 35/94]
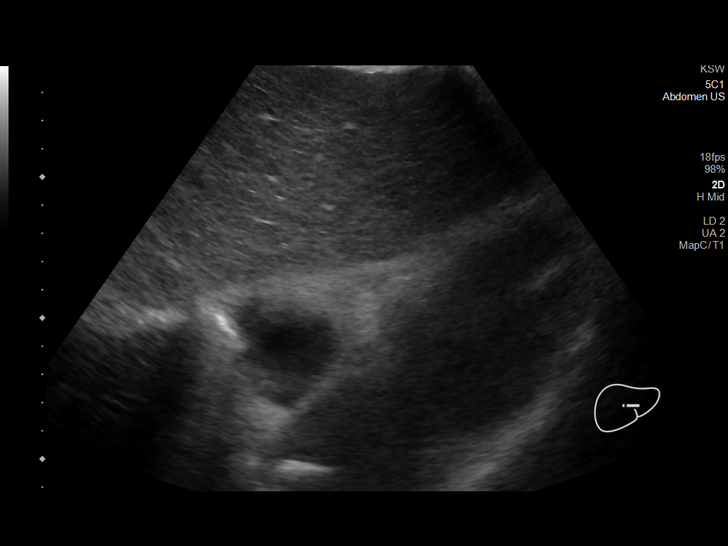
[im 43/94]
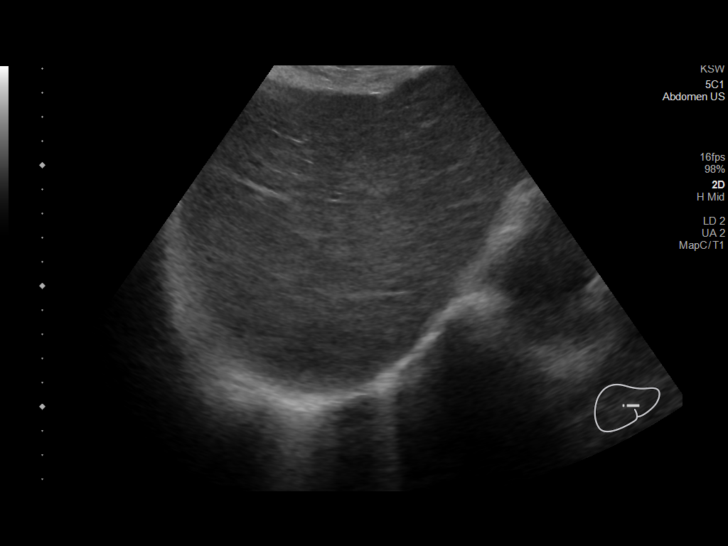
[im 51/94]
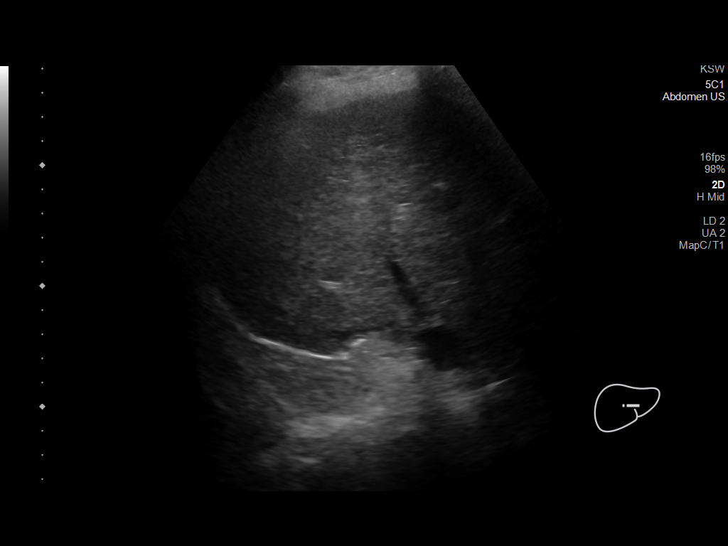
[im 59/94]
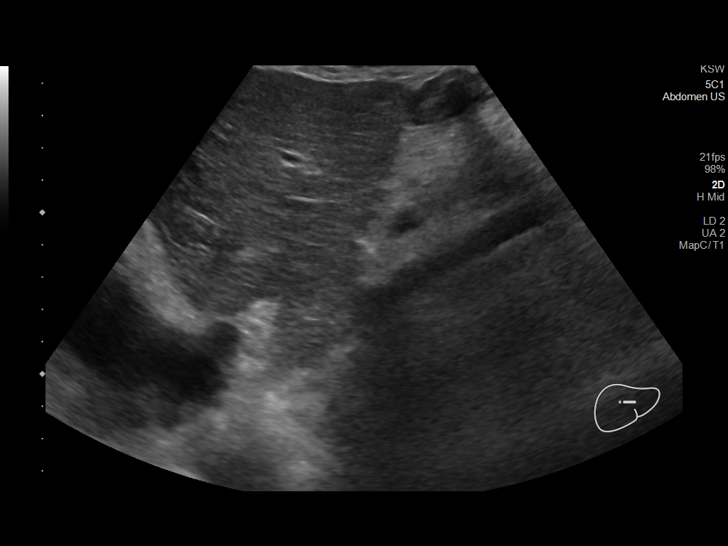
[im 63/94]
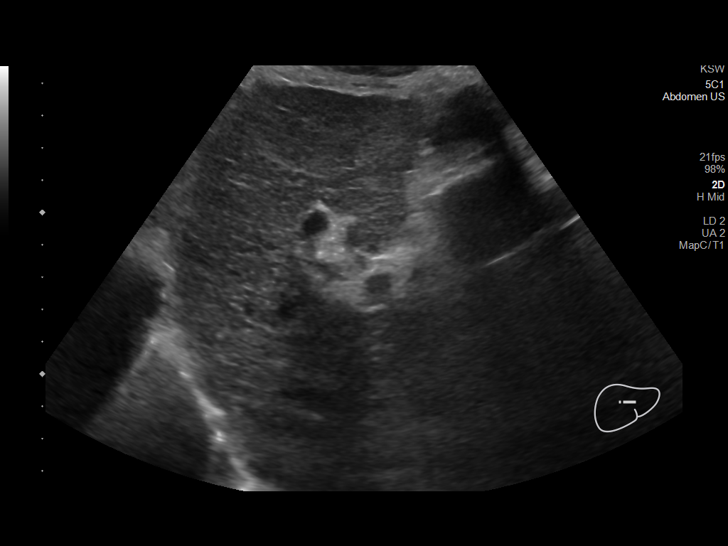
[im 70/94]
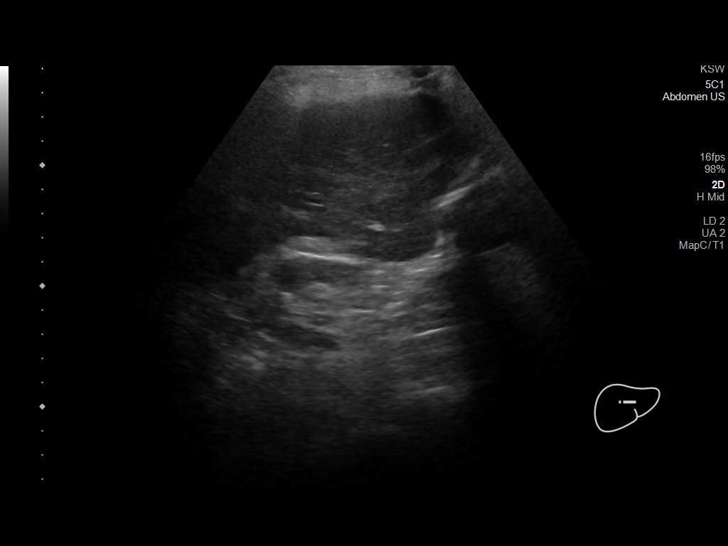
[im 78/94]
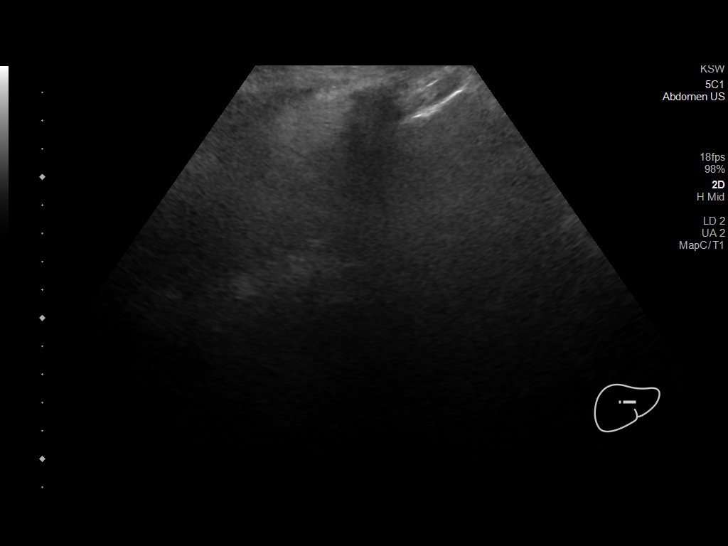
[im 86/94]
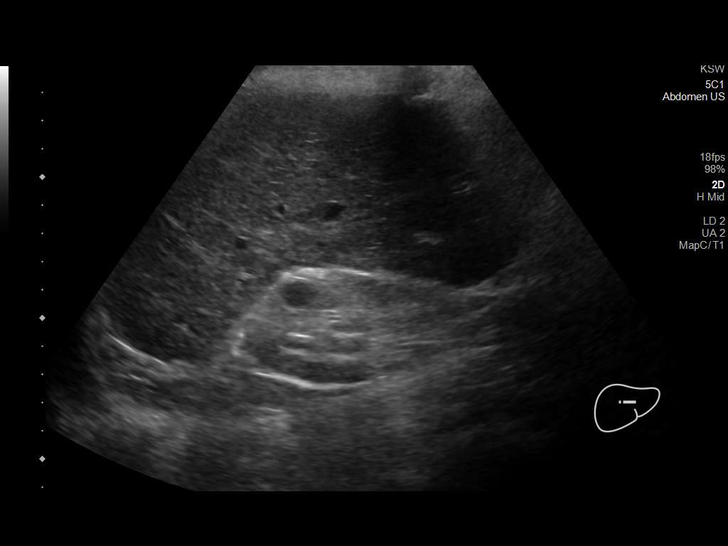
[im 94/94]
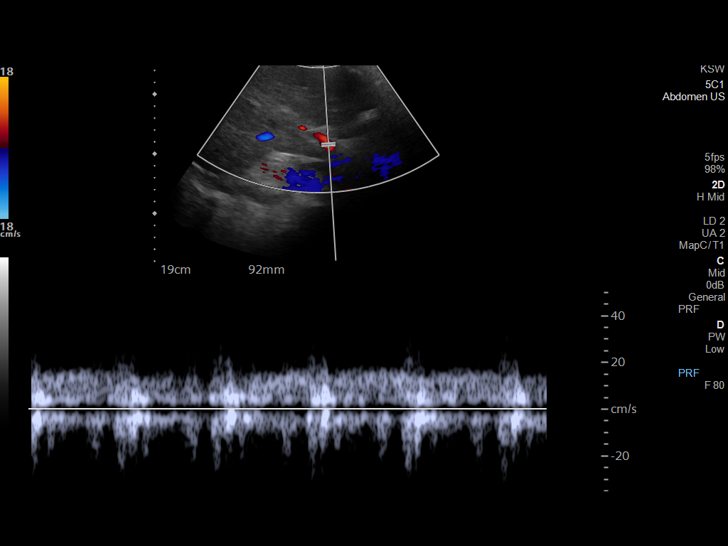

[14 of 25 positions shown; findings below may reference images not displayed]

FINDINGS: Gallbladder:

The gallbladder was not visualized and may be surgically absent

Common bile duct:

Diameter: 3 mm

Liver:

The liver parenchyma is coarsened and heterogeneous. The liver
surface appears somewhat nodular there is no discrete hepatic mass.
There is suggestion of bidirectional flow within the portal vein

Other: The right kidney appears atrophic and echogenic. There are
multiple right-sided renal cysts measuring up to approximately
cm.
IMPRESSION: 1. Gallbladder not visualized.
2. Coarsened and heterogeneous appearance of the liver parenchyma
with findings suspicious for underlying cirrhosis.
3. Borderline bidirectional flow within the main portal vein is
suggestive of portal hypertension.
4. Echogenic and atrophic appearing right kidney consistent with the
patient's history of renal failure.

## 2020-02-23 MED ORDER — HYDROCORTISONE NA SUCCINATE PF 100 MG IJ SOLR
100.0000 mg | Freq: Once | INTRAMUSCULAR | Status: DC
  Filled 2020-02-23: qty 2

## 2020-02-23 MED ORDER — GLUCOSE 40 % PO GEL
1.0000 | ORAL | Status: AC
  Administered 2020-02-23: 37.5 g via ORAL
  Filled 2020-02-23: qty 1

## 2020-02-23 MED ORDER — GLUCAGON HCL RDNA (DIAGNOSTIC) 1 MG IJ SOLR
1.0000 mg/h | INTRAVENOUS | Status: DC
  Filled 2020-02-23: qty 5

## 2020-02-23 MED ORDER — FENTANYL CITRATE (PF) 100 MCG/2ML IJ SOLN
50.0000 ug | INTRAMUSCULAR | Status: DC | PRN
  Administered 2020-02-23 – 2020-02-25 (×6): 50 ug via INTRAVENOUS
  Filled 2020-02-23 (×6): qty 2

## 2020-02-23 MED ORDER — NOREPINEPHRINE 16 MG/250ML-% IV SOLN
0.0000 ug/min | INTRAVENOUS | Status: DC
  Administered 2020-02-23: 20 ug/min via INTRAVENOUS
  Administered 2020-02-24: 18 ug/min via INTRAVENOUS
  Administered 2020-02-24: 15 ug/min via INTRAVENOUS
  Filled 2020-02-23 (×3): qty 250

## 2020-02-23 MED ORDER — DEXTROSE 20 % IV SOLN
INTRAVENOUS | Status: DC
  Filled 2020-02-23 (×4): qty 500

## 2020-02-23 MED ORDER — VANCOMYCIN HCL IN DEXTROSE 1-5 GM/200ML-% IV SOLN
INTRAVENOUS | Status: AC
  Filled 2020-02-23: qty 200

## 2020-02-23 MED ORDER — DARBEPOETIN ALFA 100 MCG/0.5ML IJ SOSY
PREFILLED_SYRINGE | INTRAMUSCULAR | Status: AC
  Administered 2020-02-23: 100 ug via INTRAVENOUS
  Filled 2020-02-23: qty 0.5

## 2020-02-23 MED ORDER — LORAZEPAM 2 MG/ML IJ SOLN
1.0000 mg | Freq: Once | INTRAMUSCULAR | Status: AC | PRN
  Administered 2020-02-23: 1 mg via INTRAMUSCULAR
  Filled 2020-02-23: qty 1

## 2020-02-23 MED ORDER — DEXTROSE 50 % IV SOLN
INTRAVENOUS | Status: AC
  Filled 2020-02-23: qty 50

## 2020-02-23 MED ORDER — ALBUMIN HUMAN 5 % IV SOLN
25.0000 g | Freq: Once | INTRAVENOUS | Status: AC
  Administered 2020-02-23: 25 g via INTRAVENOUS
  Filled 2020-02-23: qty 500

## 2020-02-23 MED ORDER — INSULIN ASPART 100 UNIT/ML ~~LOC~~ SOLN
0.0000 [IU] | SUBCUTANEOUS | Status: DC
  Administered 2020-02-23 – 2020-02-24 (×3): 9 [IU] via SUBCUTANEOUS

## 2020-02-23 MED ORDER — DEXTROSE 50 % IV SOLN
INTRAVENOUS | Status: AC
  Filled 2020-02-23: qty 150

## 2020-02-23 MED ORDER — HEPARIN SODIUM (PORCINE) 1000 UNIT/ML IJ SOLN
INTRAMUSCULAR | Status: AC
  Administered 2020-02-23: 1000 [IU] via INTRAVENOUS_CENTRAL
  Filled 2020-02-23: qty 2

## 2020-02-23 MED ORDER — GLUCAGON HCL RDNA (DIAGNOSTIC) 1 MG IJ SOLR
1.0000 mg | INTRAMUSCULAR | Status: AC
  Administered 2020-02-23: 1 mg via INTRAMUSCULAR
  Filled 2020-02-23: qty 1

## 2020-02-23 MED ORDER — NOREPINEPHRINE 4 MG/250ML-% IV SOLN: 0.0000 ug/min | INTRAVENOUS | Status: DC

## 2020-02-23 MED ORDER — PROPOFOL 1000 MG/100ML IV EMUL
5.0000 ug/kg/min | INTRAVENOUS | Status: DC
  Administered 2020-02-23: 10 ug/kg/min via INTRAVENOUS
  Administered 2020-02-24 (×2): 40 ug/kg/min via INTRAVENOUS
  Filled 2020-02-23 (×3): qty 100

## 2020-02-23 MED ORDER — VASOPRESSIN 20 UNITS/100 ML INFUSION FOR SHOCK
0.0000 [IU]/min | INTRAVENOUS | Status: DC
  Administered 2020-02-23 – 2020-02-24 (×2): 0.03 [IU]/min via INTRAVENOUS
  Filled 2020-02-23 (×2): qty 100

## 2020-02-23 MED ORDER — ORAL CARE MOUTH RINSE
15.0000 mL | OROMUCOSAL | Status: DC
  Administered 2020-02-23 – 2020-02-26 (×26): 15 mL via OROMUCOSAL

## 2020-02-23 MED ORDER — CALCITRIOL 1 MCG/ML IV SOLN
INTRAVENOUS | Status: AC
  Administered 2020-02-23: 1.5 ug
  Filled 2020-02-23: qty 2

## 2020-02-23 MED ORDER — CHLORHEXIDINE GLUCONATE 0.12% ORAL RINSE (MEDLINE KIT)
15.0000 mL | Freq: Two times a day (BID) | OROMUCOSAL | Status: DC
  Administered 2020-02-23 – 2020-02-26 (×6): 15 mL via OROMUCOSAL

## 2020-02-23 NOTE — Procedures (Signed)
Central Venous Catheter Insertion Procedure Note  [name]  [id number]  [birth year]  Date:02/23/20  Time:2:47 PM   Provider Performing:Pete Johnette Abraham Kary Kos   Procedure: Insertion of Non-tunneled Central Venous Catheter(36556) with US guidance (52174)   Indication(s) Medication administration and Difficult access  Consent Unable to obtain consent due to emergent nature of procedure.  Anesthesia Topical only with 1% lidocaine   Timeout Verified patient identification, verified procedure, site/side was marked, verified correct patient position, special equipment/implants available, medications/allergies/relevant history reviewed, required imaging and test results available.  Sterile Technique Maximal sterile technique including full sterile barrier drape, hand hygiene, sterile gown, sterile gloves, mask, hair covering, sterile ultrasound probe cover (if used).  Procedure Description Area of catheter insertion was cleaned with chlorhexidine and draped in sterile fashion.  With real-time ultrasound guidance a central venous catheter was placed into the left internal jugular vein. Nonpulsatile blood flow and easy flushing noted in all ports.  The catheter was sutured in place and sterile dressing applied.  Complications/Tolerance None; patient tolerated the procedure well. Chest X-ray is ordered to verify placement for internal jugular or subclavian cannulation.   Chest x-ray is not ordered for femoral cannulation.  EBL Minimal  Specimen(s) None Erick Colace ACNP-BC [place] Pager # [phone] OR # [phone] if no answer

## 2020-02-23 NOTE — Progress Notes (Signed)
Palliative-   Consult received and chart reviewed. Events of this afternoon noted.   Called patient's daughter- Caeden Foots- left message requesting return call.   Mariana Kaufman, AGNP-C Palliative Medicine  Please call Palliative Medicine team phone with any questions [phone]. For individual providers please see AMION.

## 2020-02-23 NOTE — Progress Notes (Signed)
Pt being wheeled in bed back to his room post HD with agonal breathing noted, opened eyes with sternal rubs. BP 77/42 HR 96, glucose 25, D50 given with a repeat check glucose still <10 repeated D50 per ordered with MD present bedside. Pt continued to decline, Rapid response called. Code blue called- see code blue noted. Family notified by MD. Pt transferred to 3M11.

## 2020-02-23 NOTE — TOC Progression Note (Signed)
Transition of Care (TOC) - Progression Note    Patient Details  Name: [name]. MRN: [id number] Date of Birth: 1951/01/09  Transition of Care [facility] CM/SW [place], LCSW Phone Number: 02/23/2020, 9:15 AM  Clinical Narrative:    CSW received call from patient's daughter, Debroah Baller. She has spoken with family and decided that patient will need to return to [facility] so that he can get a little stronger before they can get things in place for him to return home. CSW made [employer] aware of plan.    Expected Discharge Plan: [place] Barriers to Discharge: Continued Medical Work up  Expected Discharge Plan and Services Expected Discharge Plan: [place] In-house Referral: Clinical Social Work Discharge Planning Services: CM Consult Post Acute Care Choice: Durable Medical Equipment, Home Health Living arrangements for the past 2 months: Apartment                                       Social Determinants of Health (SDOH) Interventions    Readmission Risk Interventions Readmission Risk Prevention Plan 02/22/2020 01/18/2020  Transportation Screening Complete Complete  Medication Review Press photographer) Referral to Pharmacy Complete  PCP or Specialist appointment within 3-5 days of discharge Complete Complete  HRI or [place] Complete Complete  SW Recovery Care/Counseling Consult Complete Complete  Palliative Care Screening Not Applicable Complete  [place] Patient Refused Complete  Some recent data might be hidden

## 2020-02-23 NOTE — Progress Notes (Signed)
Pt BG Hypoglycemic Event  CBG: 65  Treatment: 1 tube glucose gel  Symptoms: None  Follow-up CBG: Time:0054 CBG Result:67  Possible Reasons for Event: Inadequate meal intake  Comments/MD notified: Marlowe Sax, MD notified. Charge nurse notified.   Bethena Roys

## 2020-02-23 NOTE — Progress Notes (Signed)
[facility] Progress Note  Subjective:  Patient not examined today directly given COVID-19 + status, utilizing data taken from chart +/- discussions w/ providers and staff.   Pt had agonal breathing in his room after HD today, moved to ICU and intubated.   Vitals:   02/23/20 1413 02/23/20 1445 02/23/20 1500 02/23/20 1600  BP:  (!) 91/56 (!) 66/37   Pulse:  (!) 134 95 (!) 115  Resp:  (!) 23 20 20   Temp:      TempSrc:      SpO2: 100% 100% 100% 100%  Weight:      Height:        Exam:  Patient not examined today directly given COVID-19 + status, utilizing data taken from chart +/- discussions w/ providers and staff.       OP HD: [facility] MWF   4h 34min  87kg  2/2 bath  TDC / L AVF  Hep none  -Mircera 150 mcg IV q 2 weeks (last dose 02/09/2020) -Venofer 100 mg IV X 8 doses (not started yet) -Calcitriol 1.5 mcg PO TIW  Assessment/ Plan: 1. Hyperkalemia/Uremia- in pt who signs off early on most HD session. HD this am.  2. Resp failure - agonal breathing after HD this am, now intubated in ICU.  3. AMS - multifactorial w/ missed Hd time causing uremia +/- medication side effects+ covid/ fevers. Head CT neg.  4. COVID 19-recent diagnosis. Tested positive at [facility] 09/18 and again here. CXR unremarkable. Remdesivir per protocol. Per primary 5. Recent admit for epidural abscess/MRSA cervical discitis-  has been receiving Vancomycin at OP HD center and Rifampin through 03/03/2020. Not sure is he has followed up with ID as ordered. 6.  [facility] - MWF HD. Per HD notes, some issues with cannulation. Continue using AVF here as can tolerate, 16g needles. Has TDC. 7.  Hypertension/volume - BP's low , no UF w/ HD today 8.  Anemia  - HGB 8.5. ESA due this week. Ordered darbe 100 q wed to start tomorrow. Hold OP Fe load.  9.  Metabolic bone disease -Continue binders, VDRA 10.  Nutrition -NPO at present.  10.  DM-per primary     Kelly Splinter 02/23/2020, 4:17 PM   Recent Labs  Lab  02/22/20 0837 02/22/20 0837 02/23/20 0404 02/23/20 0404 02/23/20 1531 02/23/20 1548  K 3.8   < > 4.1  --   --  3.8  BUN 53*  --  70*  --   --   --   CREATININE 10.29*  --  13.07*  --   --   --   CALCIUM 7.7*  --  8.1*  --   --   --   PHOS 8.3*  --  9.5*  --   --   --   HGB 7.6*   < > 8.6*   < > 7.8* 8.2*   < > = values in this interval not displayed.   Inpatient medications: . (feeding supplement) PROSource Plus  30 mL Oral BID  . calcitRIOL  1.5 mcg Oral Q M,W,F-HD  . Chlorhexidine Gluconate Cloth  6 each Topical Q0600  . darbepoetin (ARANESP) injection - DIALYSIS  100 mcg Intravenous Q Wed-HD  . dexamethasone (DECADRON) injection  6 mg Intravenous Q24H  . dextrose      . dextrose      . dextrose      . dextrose      . heparin  5,000 Units Subcutaneous Q8H  . hydrocortisone  sod succinate (SOLU-CORTEF) inj  100 mg Intravenous Once  . mouth rinse  15 mL Mouth Rinse BID  . midodrine  10 mg Oral TID WC  . multivitamin  1 tablet Oral QHS  . pantoprazole  40 mg Oral Q1200  . sevelamer carbonate  1,600 mg Oral TID WC  . zinc sulfate  220 mg Oral Daily   . sodium chloride    . sodium chloride    . sodium chloride Stopped (02/21/20 2227)  . dextrose Stopped (02/23/20 1528)  . norepinephrine (LEVOPHED) Adult infusion 19 mcg/min (02/23/20 1600)  . vancomycin    . vancomycin 200 mL/hr at 02/23/20 1600   sodium chloride, sodium chloride, sodium chloride, alteplase, chlorpheniramine-HYDROcodone, clonazePAM, fentaNYL (SUBLIMAZE) injection, guaiFENesin-dextromethorphan, heparin, lidocaine (PF), lidocaine-prilocaine, ondansetron **OR** ondansetron (ZOFRAN) IV, oxyCODONE, pentafluoroprop-tetrafluoroeth

## 2020-02-23 NOTE — Progress Notes (Signed)
PROGRESS NOTE                                                                                                                                                                                                             Patient Demographics:    [name], is a 69 y.o. male, DOB - 02/19/1951, JOA:[id number]  Outpatient Primary MD for the patient is [facility], [facility]   Admit date - 02/20/2020   LOS - 3  Chief Complaint  Patient presents with  . Covid Positive  . AMS       Brief Narrative: Patient is a 69 y.o. male with PMHx of ESRD on HD MWF, DM-2, MRSA bacteremia with epidural abscess s/p C4-T2 laminectomy/fusion on 01/06/2020-resident of SNF-apparently tested positive for COVID-19 on 9/18-subsequently brought to the ED on 9/19 for worsening confusion (more than baseline)-fever-thought to have acute metabolic encephalopathy from COVID-19 infection and admitted to the hospitalist service.  See below for further details.  COVID-19 vaccinated status: Unvaccinated  Significant Events: 8/3-8/23>> hospitalized for epidural abscess/MRSA bacteremia-underwent laminectomy-briefly intubated due to altered mental status.  Discharged to SNF 9/18>> COVID-19 positive at SNF 9/19>> Admit to [facility] for fever/confusion  Significant studies: 9/19>>Chest x-ray: No acute abnormality in the lung. 9/19>> CT head: No acute intracranial abnormality 9/19>> CT C-spine: Postoperative findings of posterior laminectomy/fusion C4-T1, prevertebral soft tissue edema appears reduced, postoperative fluid collection overlies laminectomy site does not appear significantly changed compared to prior examination. 9/20>> MRI C-spine: Attempted but due to AMS-not completed 9/21>> MRI C Spine: Osteomyelitis of skull base, anterior C1 and odontoid, C2-C3 facet joint-associated erosion of bone since 8/27-resolved epidural abscess  COVID-19  medications: Remdesivir: 9/19>>  Antibiotics: IV vancomycin/rifampin plan to continue through October 1  Microbiology data: 9/20 >>blood culture: No growth so far 9/21>> blood culture: No growth so far  Procedures: None  Consults: Nephrology  DVT prophylaxis: heparin injection 5,000 Units Start: 02/20/20 1645 SCDs Start: 02/20/20 1628   Subjective:   Patient remains with significant confusion, he pulled his IV overnight, mentation remains altered this morning, he has been refusing some of his meds intermittently.     Assessment  & Plan :   Acute metabolic encephalopathy:  - Multifactorial-secondary to uremia (under dialysis-did not complete multiple outpatient HD sessions) and sepsis physiology likely secondary to COVID-19.   - Neuroimaging as  above.  -He remains significantly altered, refusing his medications, pulling his IV, hospital stay complicated by hospital delirium as well. -We will check ammonia level given liver failure.  Sepsis:  - Continues to have fever-which I suspect is probably from his COVID-19 infection-he appears to have residual osteomyelitis on MRI C-spine but blood cultures negative so far.   -Continue with IV vancomycin, I have stopped rifampin 9/22 given liver failure .  Acute Hypoxic Resp Failure due to Covid 19 Viral pneumonia:  - Although no obvious infiltrates seen on chest x-ray-given hypoxia-presumed to have COVID-19 pneumonia-on steroids/remdesivir.  Remdesivir has been stopped 9/22 given liver failure, continue to attempt to titrate off FiO2.  Fever: afebrile O2 requirements:  SpO2: 95 % O2 Flow Rate (L/min): 2 L/min   COVID-19 Labs: Recent Labs    02/20/20 1315 02/21/20 0018 02/21/20 0434 02/22/20 0107 02/23/20 0404  DDIMER 2.06*  --  1.96* 8.28* 3.75*  FERRITIN  --    < > 753* >7,500* >7,500*  LDH 241*  --   --   --   --   CRP  --    < > 9.2* 8.7* 10.0*   < > = values in this interval not displayed.    No results found for:  BNP  Recent Labs  Lab 02/20/20 1315 02/21/20 0434 02/22/20 0837  PROCALCITON 0.64 0.80 1.66    Lab Results  Component Value Date   SARSCOV2NAA POSITIVE (A) 02/20/2020   SARSCOV2NAA NEGATIVE 01/23/2020   [place] NEGATIVE 01/04/2020   [place] NEGATIVE 07/27/2019     Prone/Incentive Spirometry: encouraged incentive spirometry use 3-4/hour.  Elevated D-dimer:  - Secondary to COVID-19-no worsening hypoxemia-therefore doubt PE-we will check lower extremity Doppler. If hypoxemia worsens-we will get CTA chest.  Transaminitis -This is most likely due to shock liver from hypotension, as well with the presence of COVID-19 infection, patient is on remdesivir and rifampin. -We will stop Tylenol, remdesivir, rifampin, will check ammonia level, right upper quadrant ultrasound, hepatitis panel. -GI has been consulted.  History of MRSA bacteremia with epidural abscess requiring C4-T2 laminectomy: - MRI C-spine completed on 9/21-appears to have residual osteomyelitis of the skull base area-previous MD have discussed with neurosurgeon-Dr. Kathyrn Sheriff, no new recommendation beside to continue with antibiotics regimen, as well previous MD discussed with discussed with infectious disease MD-Dr. Rulon Abide recommends continuing antibiotics as previously recommended through October 1, and rifampin will be stopped currently given elevated LFTs.  ESRD:On HD MWF-nephrology following.  Per nephrology-patient does not complete outpatient HD sessions-resulted in uremia on presentation.  -I have discussed with renal, patient only received 6 hours of dialysis over for last 4 treatments, and he was still signing off early, and he missed one of the 4 treatments.  Metabolic acidosis: Likely secondary to incomplete HD treatments-with HD x2.  Hyperkalemia: Likely secondary to incomplete dialysis treatments-resolved with hemodialysis-stop Lokelma.  Anemia: Secondary to ESRD-worsened by acute illness-defer  Aranesp/IV iron to nephrology-no evidence of acute blood loss/GI bleeding.  Transfuse if hemoglobin less than 7.  Transient hypotension: Developed last night-likely due to volume removal with HD-on midodrine  HTN: All antihypertensives on hold-on midodrine.   HLD: Statin on hold  DM-2 (A1c 6.5 on 8/11): Hypoglycemic this morning-stop SSI-follow.  Recent Labs    02/23/20 0138 02/23/20 0356 02/23/20 0729  GLUCAP 88 436* 18*   Dementia: Reviewed notes from recent admission-thought to have issues with short-term memory-suspect may have underlying dementia-has history of alcohol/cocaine use.  Maintain delirium precautions-continue Seroquel and as needed clonazepam.  Deconditioning/debility: Secondary to acute illness on top of chronic debility-Per daughter-has only walked a few steps at SNF.  PT eval completed-SNF on discharge.  Goals of care: -Patient has extremely guarded prognosis, to organ failure currently, with baseline renal failure, currently with severe encephalopathy, sepsis and liver failure, at baseline patient has been noncompliant with his dialysis, finishing dialysis sessions early, discussed goals of care with daughter, he remains a full code, but I have explained very guarded prognosis to her as well, will consult palliative to assist with goals of care if he continues to deteriorate.  GI prophylaxis: PPI  ABG:    Component Value Date/Time   PHART 7.536 (H) 01/12/2020 1209   PCO2ART 38.3 01/12/2020 1209   PO2ART 125 (H) 01/12/2020 1209   HCO3 32.5 (H) 01/12/2020 1209   TCO2 34 (H) 01/12/2020 1209   ACIDBASEDEF 3.0 (H) 01/06/2020 1651   O2SAT 99.0 01/12/2020 1209    Vent Settings: N/A   Condition - Stable  Family Communication  :  Daughter -Hanzel Pizzo [phone])updated over the phone 9/22  Code Status :  Full Code  Diet :  Diet Order            Diet renal with fluid restriction Fluid restriction: 1200 mL Fluid; Room service appropriate? Yes; Fluid  consistency: Thin  Diet effective now                  Disposition Plan  :   Status is: Inpatient  Remains inpatient appropriate because:Inpatient level of care appropriate due to severity of illness  Dispo: The patient is from: SNF              Anticipated d/c is to: Home              Anticipated d/c date is: > 3 days              Patient currently is not medically stable to d/c.   Barriers to discharge: Hypoxia requiring O2 supplementation/complete 5 days of IV Remdesivir  Antimicorbials  :    Anti-infectives (From admission, onward)   Start     Dose/Rate Route Frequency Ordered Stop   02/23/20 0642  vancomycin (VANCOCIN) 1-5 GM/200ML-% IVPB       Note to Pharmacy: [place], [place] : cabinet override      02/23/20 0642 02/23/20 1859   02/21/20 1200  vancomycin (VANCOCIN) IVPB 1000 mg/200 mL premix        1,000 mg 200 mL/hr over 60 Minutes Intravenous Every M-W-F (Hemodialysis) 02/20/20 1959     02/21/20 1000  remdesivir 100 mg in sodium chloride 0.9 % 100 mL IVPB  Status:  Discontinued       "Followed by" Linked Group Details   100 mg 200 mL/hr over 30 Minutes Intravenous Daily 02/20/20 1632 02/23/20 0633   02/20/20 2000  rifampin (RIFADIN) capsule 300 mg  Status:  Discontinued        300 mg Oral Every 12 hours 02/20/20 1907 02/23/20 0633   02/20/20 1730  remdesivir 200 mg in sodium chloride 0.9% 250 mL IVPB       "Followed by" Linked Group Details   200 mg 580 mL/hr over 30 Minutes Intravenous Once 02/20/20 1632 02/20/20 1913      Inpatient Medications  Scheduled Meds: . (feeding supplement) PROSource Plus  30 mL Oral BID  . calcitRIOL  1.5 mcg Oral Q M,W,F-HD  . Chlorhexidine Gluconate Cloth  6 each Topical Q0600  . darbepoetin (  ARANESP) injection - DIALYSIS  100 mcg Intravenous Q Wed-HD  . dexamethasone (DECADRON) injection  6 mg Intravenous Q24H  . heparin  5,000 Units Subcutaneous Q8H  . heparin sodium (porcine)      . mouth rinse  15 mL Mouth Rinse BID   . midodrine  10 mg Oral TID WC  . multivitamin  1 tablet Oral QHS  . pantoprazole  40 mg Oral Q1200  . sevelamer carbonate  1,600 mg Oral TID WC  . zinc sulfate  220 mg Oral Daily   Continuous Infusions: . sodium chloride    . sodium chloride    . sodium chloride Stopped (02/21/20 2227)  . vancomycin    . vancomycin Stopped (02/21/20 2330)   PRN Meds:.sodium chloride, sodium chloride, sodium chloride, alteplase, chlorpheniramine-HYDROcodone, clonazePAM, guaiFENesin-dextromethorphan, heparin, lidocaine (PF), lidocaine-prilocaine, ondansetron **OR** ondansetron (ZOFRAN) IV, oxyCODONE, pentafluoroprop-tetrafluoroeth  Time Spent in minutes  35  See all Orders from today for further details   Phillips Climes M.D on 02/23/2020 at 11:18 AM  To page go to [email or web address] - use universal password  [facility] -  Office  [phone]    Objective:   Vitals:   02/23/20 0900 02/23/20 0930 02/23/20 1000 02/23/20 1030  BP: 116/89 (!) 142/53 (!) 102/43 (!) 85/59  Pulse: 93 73 95 95  Resp: 18 18 17  (!) 22  Temp:      TempSrc:      SpO2:      Weight:      Height:        Wt Readings from Last 3 Encounters:  02/23/20 86.9 kg  01/28/20 86.2 kg  01/24/20 89.4 kg     Intake/Output Summary (Last 24 hours) at 02/23/2020 1118 Last data filed at 02/22/2020 1300 Gross per 24 hour  Intake 120 ml  Output --  Net 120 ml     Physical Exam Gen Exam: Laying comfortably in the bed, receiving hemodialysis, patient is altered, not answering questions appropriately or follow commands. To auscultation bilaterally anteriorly Regular rate and rhythm, no rubs or gallops Extremities with no edema, clubbing or cyanosis lying comfortably in bed-follow simple commands-not in any distress. Neurology: Has generalized weakness but no obvious focal deficits. Skin: no rash   Data Review:    CBC Recent Labs  Lab 02/20/20 1315 02/21/20 0017 02/21/20 0434 02/22/20 0837 02/23/20 0404  WBC  4.7 4.5 5.1 5.1 7.6  HGB 8.5* 7.9* 8.0* 7.6* 8.6*  HCT 26.7* 25.0* 24.8* 23.3* 25.7*  PLT 181 175 201 238 309  MCV 81.7 80.1 77.7* 76.9* 77.2*  MCH 26.0 25.3* 25.1* 25.1* 25.8*  MCHC 31.8 31.6 32.3 32.6 33.5  RDW 17.8* 17.9* 17.8* 18.0* 18.0*  LYMPHSABS 1.8  --  1.3 2.1 1.7  MONOABS 0.4  --  0.3 0.2 0.4  EOSABS 0.0  --  0.0 0.0 0.0  BASOSABS 0.0  --  0.0 0.0 0.0    Chemistries  Recent Labs  Lab 02/20/20 1315 02/21/20 0017 02/21/20 0434 02/22/20 0837 02/23/20 0404  NA 131*  --  134* 134* 135  K 6.2*  --  5.4* 3.8 4.1  CL 99  --  102 98 98  CO2 15*  --  13* 18* 18*  GLUCOSE 87  --  93 169* 107*  BUN 82*  --  93* 53* 70*  CREATININE 13.66* 14.40* 15.31* 10.29* 13.07*  CALCIUM 7.9*  --  7.8* 7.7* 8.1*  MG  --  2.0 2.1 2.0 2.2  AST 34  --  33 237* 2,439*  ALT 19  --  18 74* 654*  ALKPHOS 81  --  80 68 76  BILITOT 0.8  --  0.8 1.1 1.1   ------------------------------------------------------------------------------------------------------------------ Recent Labs    02/20/20 1315  TRIG 108    Lab Results  Component Value Date   HGBA1C 6.5 (H) 01/12/2020   ------------------------------------------------------------------------------------------------------------------ No results for input(s): TSH, T4TOTAL, T3FREE, THYROIDAB in the last 72 hours.  Invalid input(s): FREET3 ------------------------------------------------------------------------------------------------------------------ Recent Labs    02/21/20 0019 02/21/20 0434 02/22/20 0107 02/23/20 0404  VITAMINB12 489  --   --   --   FOLATE 13.7  --   --   --   FERRITIN  --    < > >7,500* >7,500*   < > = values in this interval not displayed.    Coagulation profile Recent Labs  Lab 02/21/20 0434  INR 1.8*    Recent Labs    02/22/20 0107 02/23/20 0404  DDIMER 8.28* 3.75*    Cardiac Enzymes No results for input(s): CKMB, TROPONINI, MYOGLOBIN in the last 168 hours.  Invalid input(s):  CK ------------------------------------------------------------------------------------------------------------------ No results found for: BNP  Micro Results Recent Results (from the past 240 hour(s))  SARS Coronavirus 2 by RT PCR (hospital order, performed in [facility] hospital lab) Nasopharyngeal Nasopharyngeal Swab     Status: Abnormal   Collection Time: 02/20/20  1:11 PM   Specimen: Nasopharyngeal Swab  Result Value Ref Range Status   SARS Coronavirus 2 POSITIVE (A) NEGATIVE Final    Comment: RESULT CALLED TO, READ BACK BY AND VERIFIED WITH: RN E ROSDEL [id number] AT 1539 BY CM (NOTE) SARS-CoV-2 target nucleic acids are DETECTED  SARS-CoV-2 RNA is generally detectable in upper respiratory specimens  during the acute phase of infection.  Positive results are indicative  of the presence of the identified virus, but do not rule out bacterial infection or co-infection with other pathogens not detected by the test.  Clinical correlation with patient history and  other diagnostic information is necessary to determine patient infection status.  The expected result is negative.  Fact Sheet for Patients:   StrictlyIdeas.no   Fact Sheet for Healthcare Providers:   BankingDealers.co.za    This test is not yet approved or cleared by the [place] FDA and  has been authorized for detection and/or diagnosis of SARS-CoV-2 by FDA under an Emergency Use Authorization (EUA).  This EUA will remain in effect (meaning this test  can be used) for the duration of  the COVID-19 declaration under Section 564(b)(1) of the Act, 21 U.S.C. section 360-bbb-3(b)(1), unless the authorization is terminated or revoked sooner.  Performed at [facility], [place] [address] [place], [place] [address]   Blood Culture (routine x 2)     Status: None (Preliminary result)   Collection Time: 02/21/20 12:00 AM   Specimen: BLOOD RIGHT FOREARM  Result Value Ref  Range Status   Specimen Description BLOOD RIGHT FOREARM  Final   Special Requests   Final    BOTTLES DRAWN AEROBIC AND ANAEROBIC Blood Culture adequate volume   Culture   Final    NO GROWTH 1 DAY Performed at [facility], [place] [address], [place], [place] [address]    Report Status PENDING  Incomplete  Culture, blood (Routine X 2) w Reflex to ID Panel     Status: None (Preliminary result)   Collection Time: 02/21/20 12:06 AM   Specimen: BLOOD RIGHT HAND  Result Value Ref Range Status  Specimen Description BLOOD RIGHT HAND  Final   Special Requests   Final    BOTTLES DRAWN AEROBIC AND ANAEROBIC Blood Culture adequate volume   Culture   Final    NO GROWTH 1 DAY Performed at [facility], [address], [place], [place] [address]    Report Status PENDING  Incomplete  MRSA PCR Screening     Status: None   Collection Time: 02/21/20  2:05 AM   Specimen: Nasal Mucosa; Nasopharyngeal  Result Value Ref Range Status   MRSA by PCR NEGATIVE NEGATIVE Final    Comment:        The GeneXpert MRSA Assay (FDA approved for NASAL specimens only), is one component of a comprehensive MRSA colonization surveillance program. It is not intended to diagnose MRSA infection nor to guide or monitor treatment for MRSA infections. Performed at [facility], [place] [address], [place], [place] [address]   Culture, blood (Routine X 2) w Reflex to ID Panel     Status: None (Preliminary result)   Collection Time: 02/21/20  4:41 AM   Specimen: BLOOD  Result Value Ref Range Status   Specimen Description BLOOD RIGHT ANTECUBITAL  Final   Special Requests   Final    BOTTLES DRAWN AEROBIC AND ANAEROBIC Blood Culture adequate volume   Culture   Final    NO GROWTH 1 DAY Performed at [facility], [place] [address], [place], [place] [address]    Report Status PENDING  Incomplete  Culture, blood (routine x 2)     Status: None (Preliminary result)   Collection Time: 02/22/20  8:35 AM    Specimen: BLOOD  Result Value Ref Range Status   Specimen Description BLOOD RIGHT ANTECUBITAL  Final   Special Requests   Final    BOTTLES DRAWN AEROBIC ONLY Blood Culture results may not be optimal due to an inadequate volume of blood received in culture bottles   Culture   Final    NO GROWTH < 12 HOURS Performed at [facility], [place] [address] [place], [place] [address]    Report Status PENDING  Incomplete  Culture, blood (routine x 2)     Status: None (Preliminary result)   Collection Time: 02/22/20  8:37 AM   Specimen: BLOOD RIGHT ARM  Result Value Ref Range Status   Specimen Description BLOOD RIGHT ARM  Final   Special Requests   Final    BOTTLES DRAWN AEROBIC ONLY Blood Culture results may not be optimal due to an inadequate volume of blood received in culture bottles   Culture   Final    NO GROWTH < 12 HOURS Performed at [facility], [place] [address] [place], [place] [address]    Report Status PENDING  Incomplete    Radiology Reports CT Head Wo Contrast  Result Date: 02/20/2020 CLINICAL DATA:  Delirium.  COVID-19 positive.  Recent neck surgery. EXAM: CT HEAD WITHOUT CONTRAST TECHNIQUE: Contiguous axial images were obtained from the base of the skull through the vertex without intravenous contrast. COMPARISON:  January 28, 2020 FINDINGS: Brain: No subdural, epidural, or subarachnoid hemorrhage. A lacunar infarct in the right thalamus is stable and nonacute. Moderate white matter changes again identified. No acute cortical ischemia or acute infarct noted. No mass effect or midline shift. Ventricles and sulci are unchanged. Cerebellum, brainstem, and basal cisterns are normal. Vascular: Calcified atherosclerosis in the intracranial carotids. Skull: Normal. Negative for fracture or focal lesion. Sinuses/Orbits: Mild scattered mucosal  thickening in the ethmoid air cells and left maxillary sinus. No air-fluid levels. Mastoid air cells and middle ears are well aerated. Other:  None. IMPRESSION: 1. No acute intracranial abnormalities. Chronic white matter changes. Chronic right thalamic lacunar infarct. 2. Mild sinus disease as above. Electronically Signed   By: Dorise Bullion III M.D   On: 02/20/2020 14:49   CT Head Wo Contrast  Result Date: 01/28/2020 CLINICAL DATA:  [place] after dialysis hit back of head EXAM: CT HEAD WITHOUT CONTRAST CT CERVICAL SPINE WITHOUT CONTRAST TECHNIQUE: Multidetector CT imaging of the head and cervical spine was performed following the standard protocol without intravenous contrast. Multiplanar CT image reconstructions of the cervical spine were also generated. COMPARISON:  Radiograph 01/06/2020, MRI 01/04/2020, CT brain and MRI brain 12/31/2019, CT brain 01/11/2020 FINDINGS: CT HEAD FINDINGS Brain: No acute territorial infarction, hemorrhage, or intracranial mass is visualized. Mild atrophy. Moderate hypodensity in the white matter consistent with chronic small vessel ischemic change. 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 Stable ventricle size Vascular: No hyperdense vessels.  Carotid vascular calcification. Skull: No fracture Sinuses/Orbits: No acute finding. Other: None CT CERVICAL SPINE FINDINGS Alignment: Reversal of cervical lordosis trace retrolisthesis C4 on C5. Facet alignment is maintained. Skull base and vertebrae: New erosive changes at the anterior arch of C1 and the dens of C2. There are also new erosive changes and sclerosis at the left atlanto occipital articulation. Soft tissues and spinal canal: Marked prevertebral soft tissue swelling with possible prevertebral fluid anterior to C2, series 5, image 38, though difficult to further characterize without contrast. Abnormal epidural density posterior to C2. Disc levels: Patient is status post posterior rods C4 through T1 with transpedicular screws at C4, C5, C6 and T1. Advanced degenerative changes at C6-C7 and C7-T1 with mild degenerative changes elsewhere in the cervical  spine. Upper chest: Negative. Other: None IMPRESSION: 1. No CT evidence for acute intracranial abnormality. Atrophy and chronic small vessel ischemic change of the white matter. 2. New erosive changes at the anterior arch of C1 and the dens of C2, concerning for osteomyelitis. Erosive change also present at the left atlantooccipital articulation, concerning for infection. Marked prevertebral soft tissue swelling with possible prevertebral fluid collection/abscess though further characterisation limited without contrast. Abnormal epidural density at C2, probably representing residual epidural abscess. MRI evaluation is recommended. 3. Posterior rods C4 through T1. Advanced degenerative changes at C6-C7 and C7-T1. Critical Value/emergent results were called by telephone at the time of interpretation on 01/28/2020 at 8:21 pm to provider [facility] , who verbally acknowledged these results. Electronically Signed   By: Donavan Foil M.D.   On: 01/28/2020 20:21   CT Cervical Spine Wo Contrast  Result Date: 02/20/2020 CLINICAL DATA:  COVID positive, recent neck surgery EXAM: CT CERVICAL SPINE WITHOUT CONTRAST TECHNIQUE: Multidetector CT imaging of the cervical spine was performed without intravenous contrast. Multiplanar CT image reconstructions were also generated. COMPARISON:  CT cervical spine, 01/28/2020 FINDINGS: Alignment: Normal. Skull base and vertebrae: No acute fracture. No primary bone lesion. Erosive change of the dens and adjacent transverse process of C1 are not significantly changed compared to prior examination. Soft tissues and spinal canal: Assessment of the soft tissues is somewhat limited by metallic streak artifact and lack of intravenous contrast; within this limitation, no unexpected fluid collection overlying the laminectomy site. Prevertebral soft tissue edema appears reduced compared to prior examination. No visible canal hematoma. Disc levels: Status post posterior laminectomy and fusion  of C4 through T1, with  moderate disc space height loss and osteophytosis of C6-C7 and C7-T1. Upper chest: Negative. Other: None. IMPRESSION: 1. Redemonstrated postoperative findings status post posterior laminectomy and fusion of C4 through T1, with moderate disc space height loss and osteophytosis of C6-C7 and C7-T1. 2. Prevertebral soft tissue edema appears reduced compared to prior examination. 3. Postoperative fluid collection overlying laminectomy site does not appear significantly changed compared to prior examination. 4. Assessment of the soft tissues and epidural space is generally limited on noncontrast CT and additionally limited by metallic streak artifact; contrast enhanced MRI is the test of choice for evaluation of edema, fluid collections, the epidural space, and discitis osteomyelitis if clinically suspected in the postoperative setting. Electronically Signed   By: Eddie Candle M.D.   On: 02/20/2020 14:53   CT Cervical Spine Wo Contrast  Result Date: 01/28/2020 CLINICAL DATA:  [place] after dialysis hit back of head EXAM: CT HEAD WITHOUT CONTRAST CT CERVICAL SPINE WITHOUT CONTRAST TECHNIQUE: Multidetector CT imaging of the head and cervical spine was performed following the standard protocol without intravenous contrast. Multiplanar CT image reconstructions of the cervical spine were also generated. COMPARISON:  Radiograph 01/06/2020, MRI 01/04/2020, CT brain and MRI brain 12/31/2019, CT brain 01/11/2020 FINDINGS: CT HEAD FINDINGS Brain: No acute territorial infarction, hemorrhage, or intracranial mass is visualized. Mild atrophy. Moderate hypodensity in the white matter consistent with chronic small vessel ischemic change. 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 Stable ventricle size Vascular: No hyperdense vessels.  Carotid vascular calcification. Skull: No fracture Sinuses/Orbits: No acute finding. Other: None CT CERVICAL SPINE FINDINGS Alignment: Reversal of cervical lordosis  trace retrolisthesis C4 on C5. Facet alignment is maintained. Skull base and vertebrae: New erosive changes at the anterior arch of C1 and the dens of C2. There are also new erosive changes and sclerosis at the left atlanto occipital articulation. Soft tissues and spinal canal: Marked prevertebral soft tissue swelling with possible prevertebral fluid anterior to C2, series 5, image 38, though difficult to further characterize without contrast. Abnormal epidural density posterior to C2. Disc levels: Patient is status post posterior rods C4 through T1 with transpedicular screws at C4, C5, C6 and T1. Advanced degenerative changes at C6-C7 and C7-T1 with mild degenerative changes elsewhere in the cervical spine. Upper chest: Negative. Other: None IMPRESSION: 1. No CT evidence for acute intracranial abnormality. Atrophy and chronic small vessel ischemic change of the white matter. 2. New erosive changes at the anterior arch of C1 and the dens of C2, concerning for osteomyelitis. Erosive change also present at the left atlantooccipital articulation, concerning for infection. Marked prevertebral soft tissue swelling with possible prevertebral fluid collection/abscess though further characterisation limited without contrast. Abnormal epidural density at C2, probably representing residual epidural abscess. MRI evaluation is recommended. 3. Posterior rods C4 through T1. Advanced degenerative changes at C6-C7 and C7-T1. Critical Value/emergent results were called by telephone at the time of interpretation on 01/28/2020 at 8:21 pm to provider [facility] , who verbally acknowledged these results. Electronically Signed   By: Donavan Foil M.D.   On: 01/28/2020 20:21   MR CERVICAL SPINE WO CONTRAST  Result Date: 02/22/2020 CLINICAL DATA:  69 year old male with history of dorsal cervical epidural abscess, status post operative drainage and decompression last month on 01/06/2020. Follow-up MRI 01/29/2020 with new left  occipital condyle, anterior C1 and odontoid marrow edema and erosion compatible with skull base osteomyelitis. End stage renal disease on dialysis. Confusion. Positive COVID-19. EXAM: MRI CERVICAL SPINE WITHOUT CONTRAST TECHNIQUE: Multiplanar,  multisequence MR imaging of the cervical spine was performed. No intravenous contrast was administered. COMPARISON:  Recent CT head and cervical spine 02/20/2020. Postoperative MRI 01/29/2020. Preoperative MRI FINDINGS: The examination had to be discontinued just prior to completion due to confusion, agitation. Only axial GRE imaging was not obtained. Alignment: Straightening and mild reversal of cervical lordosis remains improved since 01/04/2020. Vertebrae: Abnormal marrow edema and/or T2 and STIR hyperintense soft tissue surrounding the odontoid (series 3, image 7) which has been partially eroded along with the adjacent clivus and anterior C1 ring as seen on the recent CT. Mild marrow edema in both occipital condyles, greater on the left. Faint marrow edema also in the C2-C3 facets, mostly on the right (series 3, image 2). Overall the appearance has not progressed since 01/22/2020. Sequelae of posterior decompression and fusion from C3 through T1. Mild hardware susceptibility artifact. No other cervical or upper thoracic marrow edema or acute osseous abnormality. Cord: Suboptimal cord detail due to motion and hardware susceptibility. No definite cervical or upper thoracic spinal cord signal abnormality. Normalized epidural space since August. Posterior Fossa, vertebral arteries, paraspinal tissues: Major vascular flow voids are preserved in the neck. Postoperative changes to the posterior neck soft tissues with no adverse features. Negative visible lung apices. Cervicomedullary junction and visible posterior fossa are stable and within normal limits. Disc levels: C2-C3: Improved thecal sac patency since 01/04/2020. Foraminal endplate spurring and moderate bilateral facet  degeneration. Mild residual spinal stenosis. Mild bilateral C3 foraminal stenosis. C3-C4: Chronic central disc protrusion (series 2, image 7) with mild facet and ligament flavum hypertrophy. Improved thecal sac patency since 01/04/2020 but residual mild spinal stenosis and mild cord mass effect. Moderate bilateral C4 foraminal stenosis. C4-C5 through T1-T2: Posterior decompression with no significant spinal stenosis despite residual disc and endplate degeneration at some levels. There is mild to moderate bilateral C8 foraminal stenosis related to disc, endplate and residual facet hypertrophy at C7-T1. IMPRESSION: 1. 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 Possible involvement of the right C2-C3 facet joint also. Associated erosion of bone since 01/28/2020 as demonstrated by CT two days ago. But on MRI no other significant progression is identified since August; no new levels of involvement. 2. Resolved posterior epidural abscess following surgery last month. Satisfactorily decompressed levels C4-C5 through T1-T2. 3. Improved thecal sac patency but mild residual degenerative spinal stenosis at both C2-C3 and C3-C4. No definite spinal cord signal abnormality. Electronically Signed   By: Genevie Ann M.D.   On: 02/22/2020 11:48   MR CERVICAL SPINE WO CONTRAST  Result Date: 01/29/2020 CLINICAL DATA:  Initial evaluation for epidural abscess. EXAM: MRI CERVICAL SPINE WITHOUT CONTRAST TECHNIQUE: Multiplanar, multisequence MR imaging of the cervical spine was performed. No intravenous contrast was administered. COMPARISON:  Prior CT from 01/28/2020 as well as prior MRI from 01/04/2020. FINDINGS: Alignment: Examination moderately degraded by motion artifact. Straightening with smooth reversal of the normal cervical lordosis. Trace retrolisthesis of C4 on C5, with trace anterolisthesis of C5 on C6. Vertebrae: Postoperative changes from recent C4 through T2 laminectomy with C4 through T1 posterior arthrodesis  for debridement of previously identified epidural abscess. Vertebral body height maintained without interval fracture. Abnormal marrow edema with erosive changes seen involving the left greater than right atlantooccipital articulations, concerning for osteomyelitis and septic arthritis. Progressive erosive changes about the dens and anterior arch of C1, also consistent with infection. Changes are new/progressed as compared to prior MRI. Surrounding soft tissue swelling and edema extending from the skull base  to approximately C6, consistent with associated phlegmon. Probable small collections seen involving the prevertebral soft tissues at the level of C2, largest of which measures 10 mm on the left (series 1033, image 46). Circumferential epidural collection extending from the foramen magnum to approximately C3-4 consistent with persistent epidural abscess (series 10, image 30 on sagittal view, series 1033, image 44 on axial view). Resultant moderate diffuse spinal stenosis at these levels. No frank cord impingement for compression. Distally, there is increased fluid signal intensity seen within the C5-6, C6-7, and C7-T1 interspace is, new from prior, and suspicious for associated discitis (series 7, image 8). No significant erosion or edema within the adjacent endplates. Cord: No definite cord signal abnormality to suggest myelopathy or edema on this motion degraded exam. Posterior Fossa, vertebral arteries, paraspinal tissues: Remote right pontine lacunar infarct noted. Associated age-related cerebral atrophy. Extensive edema throughout the posterior paraspinous soft tissues extending from the skull base to approximately T2, which could be related to postoperative changes and/or soft tissue infection. Small collection measuring approximately 1.6 x 1.3 x 7.6 cm seen along the midline incision (series 9, image 40). Prevertebral swelling and phlegmon with superimposed small prevertebral collections as above. Normal  flow voids preserved within the vertebral arteries bilaterally. Disc levels: C1-2: Circumferential epidural abscess with effacement of the thecal sac. Resultant moderate to severe spinal stenosis without frank cord compression (series 1033, image 44). C2-C3: No significant disc bulge. Circumferential epidural abscess with resultant moderate spinal stenosis. C3-C4: Diffuse disc bulge with bilateral uncovertebral hypertrophy. Resultant mild-to-moderate spinal stenosis. Moderate right with mild left C4 foraminal narrowing. C4-C5: Small central disc protrusion indents the ventral thecal sac (series 1033, image 68). Prior posterior decompression and fusion. No significant spinal stenosis or cord deformity. Foramina appear grossly patent. C5-C6: Mild diffuse disc bulge with uncovertebral hypertrophy. Prior posterior decompression with fusion. No residual spinal stenosis. Foramina appear grossly patent. C6-C7: Degenerative intervertebral disc space narrowing with diffuse disc osteophyte, slightly asymmetric to the left. Disc osteophyte indents and partially effaces the left ventral thecal sac. Prior posterior decompression and fusion. No significant spinal stenosis. Severe left C7 foraminal narrowing. No significant right foraminal stenosis. C7-T1: Mild disc bulge with uncovertebral hypertrophy. Prior posterior decompression with fusion. No residual spinal stenosis. Foramina appear grossly patent. IMPRESSION: 1. Postoperative changes from recent C4-T2 laminectomy with C4-T1 posterior arthrodesis for debridement of previously identified epidural abscess. 2. Abnormal marrow edema with erosive changes involving the atlantooccipital and C1-2 articulations, consistent with osteomyelitis and septic arthritis. 3. Persistent circumferential epidural abscess extending from the foramen magnum to approximately C3-4, with resultant moderate to severe spinal stenosis. No frank cord compression or cord signal changes. 4. Extensive edema  and phlegmonous change involving the prevertebral soft tissues extending from the clivus to approximately C6, consistent with infection. Superimposed small prevertebral collections measuring up to 10 mm at the level of C2 as above. Edema involving the posterior paraspinous soft tissues could reflect postoperative change and/or infection. Superimposed 1.6 x 1.3 x 7.6 cm collection along the posterior midline incision. 5. Increased fluid signal intensity within the C5-6, C6-7, and C7-T1 interspace, nonspecific, but could reflect associated discitis. Electronically Signed   By: Jeannine Boga M.D.   On: 01/29/2020 01:28   DG Chest Port 1 View  Result Date: 02/20/2020 CLINICAL DATA:  COVID EXAM: PORTABLE CHEST 1 VIEW COMPARISON:  01/12/2020 FINDINGS: Interval extubation. Interval placement of large-bore right neck vascular catheter, tip position near the superior cavoatrial junction. The heart size and mediastinal contours are within  normal limits. Both lungs are clear. The visualized skeletal structures are unremarkable. IMPRESSION: 1. Interval placement of large-bore right neck vascular catheter, tip position near the superior cavoatrial junction. 2. No acute abnormality of the lungs in AP portable projection. Electronically Signed   By: Eddie Candle M.D.   On: 02/20/2020 13:43   DG Chest Port 1V same Day  Result Date: 02/21/2020 CLINICAL DATA:  Shortness of breath, COVID EXAM: PORTABLE CHEST 1 VIEW COMPARISON:  02/20/2020 FINDINGS: Right dialysis catheter remains in place, unchanged. Heart is normal size. No confluent airspace opacities or effusions. No acute bony abnormality. IMPRESSION: No acute cardiopulmonary disease. Electronically Signed   By: Rolm Baptise M.D.   On: 02/21/2020 13:03

## 2020-02-23 NOTE — Progress Notes (Signed)
SLP Cancellation Note  Patient Details Name: [name]. MRN: [id number] DOB: 1950/11/08   Cancelled treatment:       Reason Eval/Treat Not Completed: Medical issues which prohibited therapy. Per MD note, patient became obtunded following Hemodialysis, requiring emergent intubation and transfer to ICU.  Sonia Baller, MA, CCC-SLP Speech Therapy [facility] Pager: [phone]

## 2020-02-23 NOTE — Progress Notes (Signed)
Unable to update mew scores, pt in hemodialysis from [phone].

## 2020-02-23 NOTE — Procedures (Signed)
Arterial Catheter Insertion Procedure Note  [name]  [id number]  29-Sep-1950  Date:02/23/20  Time:3:47 PM    Provider Performing: Sharla Kidney    Procedure: Insertion of Arterial Line [id number]) without US guidance  Indication(s) Blood pressure monitoring and/or need for frequent ABGs  Consent Unable to obtain consent due to emergent nature of procedure.  Anesthesia None   Time Out Verified patient identification, verified procedure, site/side was marked, verified correct patient position, special equipment/implants available, medications/allergies/relevant history reviewed, required imaging and test results available.   Sterile Technique Maximal sterile technique including full sterile barrier drape, hand hygiene, sterile gown, sterile gloves, mask, hair covering, sterile ultrasound probe cover (if used).   Procedure Description Area of catheter insertion was cleaned with chlorhexidine and draped in sterile fashion. Without real-time ultrasound guidance an arterial catheter was placed into the right radial artery.  Appropriate arterial tracings confirmed on monitor.     Complications/Tolerance None; patient tolerated the procedure well.   EBL Minimal   Specimen(s) None

## 2020-02-23 NOTE — Procedures (Signed)
Intubation Procedure Note  [name]  [id number]  22-Jun-1950  Date:02/23/20  Time:3:29 PM   Provider Performing:Lillah Standre    Procedure: Intubation (31500)  Indication(s) Respiratory Failure  Consent Unable to obtain consent due to emergent nature of procedure.   Anesthesia Etomidate and Rocuronium   Time Out Verified patient identification, verified procedure, site/side was marked, verified correct patient position, special equipment/implants available, medications/allergies/relevant history reviewed, required imaging and test results available.   Sterile Technique Usual hand hygeine, masks, and gloves were used   Procedure Description Patient positioned in bed supine.  Sedation given as noted above.  Patient was intubated with endotracheal tube using Glidescope.  View was Grade 1 full glottis .  Number of attempts was 1.  Colorimetric CO2 detector was consistent with tracheal placement.   Complications/Tolerance None; patient tolerated the procedure well. Chest X-ray is ordered to verify placement.   EBL Minimal   Specimen(s) None

## 2020-02-23 NOTE — Progress Notes (Signed)
[facility] Progress Note Patient Name: [name]. DOB: 12-25-1950 MRN: [id number]   Date of Service  02/23/2020  HPI/Events of Note  Request for pressors and continuous sedation Patient seen intubated with increased work of breathing BP 106/54  HR 108  O2 98% Glucose 600s given multiple amps of D50 and was on D20 No prn Fentanyl given due to borderline BP despite norepinephrine  eICU Interventions  Ordered albumin bolus and start vasopressiin if BP remains low Hold off on continuous sedation for now given renal and liver failure Continue low dose SSI     Intervention Category Major Interventions: Hypotension - evaluation and management;Hyperglycemia - active titration of insulin therapy  Judd Lien 02/23/2020, 7:50 PM

## 2020-02-23 NOTE — Progress Notes (Signed)
   02/23/20 1300  Clinical Encounter Type  Visited With Patient not available  Visit Type Code  Referral From Nurse  Consult/Referral To Chaplain  Chaplain responded to code blue. No family present. Chaplain Owens Shark will follow up as needed.

## 2020-02-23 NOTE — Significant Event (Signed)
Rapid Response Event Note   Reason for Call :  Called to bedside for snoring respirations and CBG 25  Initial Focused Assessment:  Patient obtunded, CCM at bedside Code blue called see code blue record for details      Interventions:  See code blue record for details  Plan of Care:  Transfer 34M   Event Summary:   MD Notified: CCM at bedside on arrival  Call Time: [place] Time: 1302  End Time: [place]  Charlyne Quale, RN

## 2020-02-23 NOTE — Progress Notes (Signed)
Bilateral Lower Ext. study completed.   See CVProc for preliminary results.   Anita Mcadory, RDMS, RVT 

## 2020-02-23 NOTE — Progress Notes (Signed)
Glucose level was low throughout the night, poor po intake due to confusion. Pt was able to take all of his po meds prior to HD. Morning Glucose 68, alert, gave glucose gel and juices prior to taken to HD. Recheck glucose level in HD.

## 2020-02-23 NOTE — Progress Notes (Signed)
  Upon coming back from his hemodialysis session, patient noted to be obtunded, with shallow slow breathing, CBGs obtained through peripheral digits, going extremely low despite reading receiving multiple D50 pushes(he received up to 8 D50 pushes), even with that he remains obtundent, and low CBGs obtained through his digits, PCCM were called, patient was emergently intubated, D10 was started, he was hypotensive so Levophed drip was started as well, patient never lost pulse, he maintained good pulse through entire rapid response event, and CBG were checked through his earlobes it was clear he is hyperglycemic with CBGs in the 400s, and this was confirmed through blood drawn through his hemodialysis catheter which was in the 500 range as well, so initial low CBGs are erroneous with poor peripheral circulation, patient is transferred to ICU, his daughter [name] was updated about these events at 2:05 PM. Phillips Climes MD

## 2020-02-23 NOTE — Consult Note (Addendum)
Consultation  Referring Provider: [facility]/ Elgergawy Primary Care Physician:  [facility] Primary Gastroenterologist:  Dr.Jacobs-procedures, prior GI care through the [facility]  Reason for Consultation:   Elevated LFT's   HPI: [name]. is a 69 y.o. 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  This was a T3 lesion complicated by anastomotic leak requiring ileostomy. He had undergone colonoscopy per Dr. Ardis Hughs in January 2021 which was a normal exam with end to side anastomosis.  He also had EGD which showed a small hiatal hernia and was otherwise negative. Patient was admitted on 02/20/2020 in transfer from skilled nursing facility where he has been recuperating after a recent C4/T2 laminectomy/fusion done on 01/06/2020 for MRSA bacteremia with epidural abscess. He was brought to the emergency room on 02/20/2020 for fever and altered mental status.  He had tested Covid positive on 02/19/2020. CT of the head on admission no acute intracranial abnormality Cervical spine MRI 02/21/2021 shows osteomyelitis of the skull base, anterior C1 and odontoid, C2-C3 facet joint associated erosion of bone since 01/28/2020, resolved epidural abscess. He had been started on vancomycin and rifampin previously which he was to continue through 03/03/2020. He was started on steroids and given remdesivir with hypoxic respiratory failure secondary to COVID-19 pneumonia.  Remdesivir was stopped today after noted significant rise in LFTs. Patient had normal LFTs on admit. Labs today show T bili 1.1/AST 2039/ALT 654/alk phos 76 BUN 70/creatinine 13.0.  He had been hypotensive-[place] in the 90s early this a.m.  He had dialysis this morning, when he returned from dialysis this afternoon was noted to  be lethargic, blood glucose about 20, breathing agonal.  Significantly Hypotensive, Did Not Have Cardiac Arrest.  He Has Been Intubated and Started on Levophed.  Abdominal ultrasound pending.     Past Medical History:  Diagnosis Date  . Anemia   . Arthritis    left knee  . Chronic kidney disease    acute renal failure/injury requiring short-term HD 2013  . Colon cancer ([place])   . Colon polyp    11/2011 - Polyps identified, biopsy - invasive adenocarinoma  . DM II (diabetes mellitus, type II), controlled ([place])    type 2 IDDM x 15 years. A1C 1/09 13.7.   Marland Kitchen 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 12/25/2011  . GERD (gastroesophageal reflux disease)   . Heart murmur   . 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 Hyperlipidemia   . Hypertension    for a few years and resolved in 2013/2014.  . Sleep apnea    does not wear CPAP now  . Wears glasses     Past Surgical History:  Procedure Laterality Date  . APPLICATION OF WOUND VAC  02/16/2012   Procedure: APPLICATION OF WOUND VAC;  Surgeon: Zenovia Jarred, MD;  Location: [place];  Service: General;  Laterality: N/A;  . APPLICATION OF WOUND VAC  02/26/2012   Procedure: APPLICATION OF WOUND VAC;  Surgeon: Imogene Burn. [name], MD;  Location: [place];  Service: General;  Laterality: N/A;  . AV FISTULA PLACEMENT Left 07/29/2019   Procedure: ARTERIOVENOUS (AV) FISTULA CREATION;  Surgeon: Marty Heck, MD;  Location: [place];  Service: Vascular;  Laterality: Left;  . COLONOSCOPY    . COLONOSCOPY  02/06/2012   Procedure: COLONOSCOPY;  Surgeon: Melene Plan  Ardis Hughs, MD;  Location: [place];  Service: Endoscopy;;  . COLONOSCOPY WITH PROPOFOL N/A 06/10/2019   Procedure: COLONOSCOPY WITH PROPOFOL;  Surgeon: Milus Banister, MD;  Location: [facility];  Service: Endoscopy;  Laterality: N/A;  . COLOSTOMY REVERSAL    . DRESSING CHANGE UNDER ANESTHESIA  02/18/2012   Procedure: DRESSING CHANGE UNDER ANESTHESIA;  Surgeon: Odis Hollingshead, MD;  Location: [place];   Service: General;  Laterality: N/A;  . ESOPHAGOGASTRODUODENOSCOPY (EGD) WITH PROPOFOL N/A 06/10/2019   Procedure: ESOPHAGOGASTRODUODENOSCOPY (EGD) WITH PROPOFOL;  Surgeon: Milus Banister, MD;  [facility]: [facility];  Service: Endoscopy;  Laterality: N/A;  . GANGLION CYST EXCISION  20 YRS AGO   RT ARM  . ILEOSTOMY  02/16/2012   Procedure: ILEOSTOMY;  Surgeon: Zenovia Jarred, MD;  Location: [place];  Service: General;  Laterality: N/A;  . ILEOSTOMY CLOSURE N/A 09/02/2012   Procedure: ILEOSTOMY REVERSAL;  Surgeon: Imogene Burn. [name], MD;  Location: [place];  Service: General;  Laterality: N/A;  . ILIOSTOMY    . INCISION AND DRAINAGE OF WOUND  02/23/2012   Procedure: IRRIGATION AND DEBRIDEMENT WOUND;  Surgeon: Joyice Faster. Cornett, MD;  Location: [place];  Service: General;  Laterality: N/A;  . IR DIALY SHUNT INTRO [place] W/IMG LEFT Left 01/21/2020  . IR FLUORO GUIDE CV LINE RIGHT  01/21/2020  . IR REMOVAL TUN CV CATH W/O FL  01/07/2020  . IR US GUIDE VASC ACCESS LEFT  01/21/2020  . IR US GUIDE VASC ACCESS RIGHT  01/21/2020  . LAPAROTOMY  02/16/2012   Procedure: EXPLORATORY LAPAROTOMY;  Surgeon: Zenovia Jarred, MD;  Location: [place];  Service: General;  Laterality: N/A;  Exploratory Laparotomy with resection of anastomosis  . LAPAROTOMY  02/18/2012   Procedure: EXPLORATORY LAPAROTOMY;  Surgeon: Odis Hollingshead, MD;  Location: [place];  Service: General;  Laterality: N/A;  exploratory laparotomy  change of abdominal vac dressing  . LAPAROTOMY  02/20/2012   Procedure: EXPLORATORY LAPAROTOMY;  Surgeon: Odis Hollingshead, MD;  Location: [place];  Service: General;  Laterality: N/A;  . LAPAROTOMY  02/23/2012   Procedure: EXPLORATORY LAPAROTOMY;  Surgeon: Joyice Faster. Cornett, MD;  Location: [place];  Service: General;  Laterality: N/A;  Irrigation and Debridement of abdominal wound with wound vac change with partial closure  . LAPAROTOMY  02/26/2012   Procedure: EXPLORATORY LAPAROTOMY;  Surgeon: Imogene Burn.  [name], MD;  Location: [place];  Service: General;  Laterality: N/A;     . PARTIAL COLECTOMY  02/06/2012  . PARTIAL COLECTOMY  02/06/2012   Procedure: PARTIAL COLECTOMY;  Surgeon: Imogene Burn. [name], MD;  Location: [place];  Service: General;  Laterality: N/A;  right partial colectomy  . POSTERIOR CERVICAL FUSION/FORAMINOTOMY N/A 01/06/2020   Procedure: CERVICAL FOUR- THORACIC TWO LAMINECTOMY AND FUSION;  Surgeon: Consuella Lose, MD;  Location: [place];  Service: Neurosurgery;  Laterality: N/A;  CERVICAL FOUR- THORACIC TWO LAMINECTOMY AND FUSION  . RESECTION SMALL BOWEL / CLOSURE ILEOSTOMY  402/2014   Dr [name]  . VACUUM ASSISTED CLOSURE CHANGE  02/20/2012   Procedure: ABDOMINAL VACUUM ASSISTED CLOSURE CHANGE;  Surgeon: Odis Hollingshead, MD;  Location: [place];  Service: General;;  . VACUUM ASSISTED CLOSURE CHANGE  02/23/2012   Procedure: ABDOMINAL VACUUM ASSISTED CLOSURE CHANGE;  Surgeon: Joyice Faster. Cornett, MD;  Location: [place];  Service: General;  Laterality: N/A;    Prior to Admission medications   Medication Sig Start Date End Date Taking? Authorizing Provider  amLODipine (NORVASC) 10  MG tablet Take 1 tablet (10 mg total) by mouth at bedtime. 01/24/20 02/23/20 Yes [place] Eric J, DO  atorvastatin (LIPITOR) 20 MG tablet Take 1 tablet (20 mg total) by mouth at bedtime. 01/24/20 02/23/20 Yes [place] Donnamarie Poag, DO  calcitRIOL (ROCALTROL) 0.25 MCG capsule Take 5 capsules (1.25 mcg total) by mouth 3 (three) times a week. Patient taking differently: Take 1.25 mcg by mouth every Monday, Wednesday, and Friday.  01/24/20 02/23/20 Yes [place] Eric J, DO  clonazePAM (KLONOPIN) 0.5 MG tablet Take 1 tablet (0.5 mg total) by mouth 2 (two) times daily as needed (Anxiety/agitation). 01/24/20 02/23/20 Yes [place] Eric J, DO  gabapentin (NEURONTIN) 100 MG capsule Take 100 mg by mouth at bedtime. Left arm pain for 28 days (ends 9.29.2021)   Yes [provider]  HYDROcodone-acetaminophen (NORCO/VICODIN) 5-325 MG tablet  Take 1 tablet by mouth every 6 (six) hours as needed for moderate pain.   Yes [provider]  insulin detemir (LEVEMIR) 100 UNIT/ML injection Inject 0.11 mLs (11 Units total) into the skin 2 (two) times daily. 01/24/20 02/23/20 Yes [place] Eric J, DO  metoprolol tartrate (LOPRESSOR) 25 MG tablet Take 1 tablet (25 mg total) by mouth 2 (two) times daily. 01/24/20 02/23/20 Yes [place] Donnamarie Poag, DO  Multiple Vitamin (MULTIVITAMIN WITH MINERALS) TABS tablet Take 1 tablet by mouth daily.   Yes [provider]  polyethylene glycol (MIRALAX / GLYCOLAX) 17 g packet Take 17 g by mouth daily as needed for mild constipation.   Yes [provider]  QUEtiapine (SEROQUEL) 25 MG tablet Take 0.5 tablets (12.5 mg total) by mouth at bedtime. 01/24/20 02/23/20 Yes [place] Eric J, DO  rifampin (RIFADIN) 300 MG capsule Take 1 capsule (300 mg total) by mouth every 12 (twelve) hours. 5/97/41 63/8/45 Yes Delora Fuel, MD  sevelamer carbonate (RENVELA) 800 MG tablet Take 2 tablets (1,600 mg total) by mouth 3 (three) times daily with meals. 01/24/20 02/23/20 Yes [place] Donnamarie Poag, DO  Darbepoetin Alfa (ARANESP) 100 MCG/0.5ML SOSY injection Inject 0.5 mLs (100 mcg total) into the vein every Monday with hemodialysis. Patient not taking: Reported on 02/21/2020 01/24/20   [place] Eric J, DO  multivitamin (RENA-VIT) TABS tablet Take 1 tablet by mouth at bedtime. Patient not taking: Reported on 02/21/2020 01/24/20 02/23/20  [place] Eric J, DO    Current Facility-Administered Medications  Medication Dose Route Frequency Provider Last Rate Last Admin  . (feeding supplement) PROSource Plus liquid 30 mL  30 mL Oral BID Valentina Gu, NP   30 mL at 02/23/20 0747  . 0.9 %  sodium chloride infusion  100 mL Intravenous PRN Valentina Gu, NP      . 0.9 %  sodium chloride infusion  100 mL Intravenous PRN Valentina Gu, NP      . 0.9 %  sodium chloride infusion   Intravenous PRN Jonetta Osgood, MD    Paused at 02/21/20 2227  . alteplase (CATHFLO ACTIVASE) injection 2 mg  2 mg Intracatheter Once PRN Valentina Gu, NP      . calcitRIOL (ROCALTROL) capsule 1.5 mcg  1.5 mcg Oral Q M,W,F-HD Valentina Gu, NP   1.5 mcg at 02/21/20 1246  . Chlorhexidine Gluconate Cloth 2 % PADS 6 each  6 each Topical Q0600 Valentina Gu, NP   6 each at 02/23/20 [phone]  . chlorpheniramine-HYDROcodone (TUSSIONEX) 10-8 MG/5ML suspension 5 mL  5 mL Oral Q12H PRN Pahwani, Michell Heinrich, MD      .  clonazePAM (KLONOPIN) tablet 0.5 mg  0.5 mg Oral BID PRN Jonetta Osgood, MD   0.5 mg at 02/22/20 1010  . Darbepoetin Alfa (ARANESP) injection 100 mcg  100 mcg Intravenous Q Wed-HD Roney Jaffe, MD   100 mcg at 02/23/20 0844  . dexamethasone (DECADRON) injection 6 mg  6 mg Intravenous Q24H Jonetta Osgood, MD   6 mg at 02/22/20 1207  . dextrose 20 % infusion   Intravenous Continuous Elgergawy, Dawood S, MD      . dextrose 50 % solution           . dextrose 50 % solution           . dextrose 50 % solution           . dextrose 50 % solution           . guaiFENesin-dextromethorphan (ROBITUSSIN DM) 100-10 MG/5ML syrup 10 mL  10 mL Oral Q4H PRN Pahwani, Rinka R, MD      . heparin injection 1,000 Units  1,000 Units Dialysis PRN Valentina Gu, NP   1,000 Units at 02/23/20 1158  . heparin injection 5,000 Units  5,000 Units Subcutaneous Q8H Pahwani, Rinka R, MD   5,000 Units at 02/23/20 0644  . hydrocortisone sodium succinate (SOLU-CORTEF) 100 MG injection 100 mg  100 mg Intravenous Once Bowser, Grace E, NP      . lidocaine (PF) (XYLOCAINE) 1 % injection 5 mL  5 mL Intradermal PRN Valentina Gu, NP      . lidocaine-prilocaine (EMLA) cream 1 application  1 application Topical PRN Valentina Gu, NP      . MEDLINE mouth rinse  15 mL Mouth Rinse BID Pahwani, Rinka R, MD   15 mL at 02/21/20 2231  . midodrine (PROAMATINE) tablet 10 mg  10 mg Oral TID WC Jonetta Osgood, MD   10 mg at 02/23/20 0748    . multivitamin (RENA-VIT) tablet 1 tablet  1 tablet Oral QHS Valentina Gu, NP   1 tablet at 02/21/20 2229  . ondansetron (ZOFRAN) tablet 4 mg  4 mg Oral Q6H PRN Pahwani, Rinka R, MD       Or  . ondansetron (ZOFRAN) injection 4 mg  4 mg Intravenous Q6H PRN Pahwani, Rinka R, MD      . oxyCODONE (Oxy IR/ROXICODONE) immediate release tablet 5 mg  5 mg Oral Q4H PRN Pahwani, Rinka R, MD      . pantoprazole (PROTONIX) EC tablet 40 mg  40 mg Oral Q1200 Jonetta Osgood, MD   40 mg at 02/23/20 0748  . pentafluoroprop-tetrafluoroeth (GEBAUERS) aerosol 1 application  1 application Topical PRN Valentina Gu, NP      . sevelamer carbonate (RENVELA) tablet 1,600 mg  1,600 mg Oral TID WC Valentina Gu, NP   1,600 mg at 02/21/20 1156  . vancomycin (VANCOCIN) 1-5 GM/200ML-% IVPB           . vancomycin (VANCOCIN) IVPB 1000 mg/200 mL premix  1,000 mg Intravenous Q M,W,F-HD [facility]   Stopped at 02/21/20 2330  . zinc sulfate capsule 220 mg  220 mg Oral Daily Pahwani, Rinka R, MD   220 mg at 02/23/20 0749    Allergies as of 02/20/2020  . (No Known Allergies)    Family History  Problem Relation Age of Onset  . Diabetes Father   . Heart disease Father   . Hypertension Mother   . Diabetes Sister   .  Heart disease Sister   . Colon cancer Neg Hx     Social History   Socioeconomic History  . Marital status: Divorced    Spouse name: Not on file  . Number of children: 3  . Years of education: Not on file  . Highest education level: Not on file  Occupational History  . Occupation: retired/disabled    Employer: [employer]    Comment: Worked at [employer]  . Smoking status: Current Every Day Smoker    Packs/day: 0.50    Years: 30.00    Pack years: 15.00    Types: Cigarettes  . Smokeless tobacco: Never Used  Substance and Sexual Activity  . Alcohol use: Yes    Comment: Liquor twice monthly.  . Drug use: Yes    Types: Cocaine    Comment: Crack  Cocaine used last night (01/03/20)  . Sexual activity: Not on file  Other Topics Concern  . Not on file  Social History Narrative   Financial assistance approved for 100% discount at [facility] and has [facility] card   [employer]  August 08, 2009 5:48 PM   *PATIENT WAS GIVEN DM CARD.Lela Sturdivant NT II  June 08, 2010 3:56 PM      Divorced, one daughter currently staying with him   Has 7 grand children   Reports disability and medicaid is pending   Social Determinants of Health   Financial Resource Strain:   . Difficulty of Paying Living Expenses: Not on file  Food Insecurity:   . Worried About Charity fundraiser in the Last Year: Not on file  . Ran Out of Food in the Last Year: Not on file  Transportation Needs:   . Lack of Transportation (Medical): Not on file  . Lack of Transportation (Non-Medical): Not on file  Physical Activity:   . Days of Exercise per Week: Not on file  . Minutes of Exercise per Session: Not on file  Stress:   . Feeling of Stress : Not on file  Social Connections:   . Frequency of Communication with Friends and Family: Not on file  . Frequency of Social Gatherings with Friends and Family: Not on file  . Attends Religious Services: Not on file  . Active Member of Clubs or Organizations: Not on file  . Attends Archivist Meetings: Not on file  . Marital Status: Not on file  Intimate Partner Violence:   . Fear of Current or Ex-Partner: Not on file  . Emotionally Abused: Not on file  . Physically Abused: Not on file  . Sexually Abused: Not on file    Review of Systems: Pt unable to offer Physical Exam: Vital signs in last 24 hours: Temp:  [97.9 F (36.6 C)-99.6 F (37.6 C)] 98 F (36.7 C) (09/22 0825) Pulse Rate:  [73-100] 95 (09/22 1130) Resp:  [17-25] 22 (09/22 1130) BP: (85-142)/(43-89) 88/56 (09/22 1130) SpO2:  [92 %-100 %] 100 % (09/22 1413) FiO2 (%):  [100 %] 100 % (09/22 1413) Weight:  [86.9 kg] 86.9 kg (09/22 0825) Last BM Date:  02/22/20 (Rectal pouch)  not examined  Intake/Output from previous day: 09/21 0701 - 09/22 0700 In: 240 [P.O.:240] Out: 325 [Stool:325] Intake/Output this shift: No intake/output data recorded.  Lab Results: Recent Labs    02/21/20 0434 02/22/20 0837 02/23/20 0404  WBC 5.1 5.1 7.6  HGB 8.0* 7.6* 8.6*  HCT 24.8* 23.3* 25.7*  PLT 201 238 309   BMET  Recent Labs    02/21/20 0434 02/22/20 0837 02/23/20 0404  NA 134* 134* 135  K 5.4* 3.8 4.1  CL 102 98 98  CO2 13* 18* 18*  GLUCOSE 93 169* 107*  BUN 93* 53* 70*  CREATININE 15.31* 10.29* 13.07*  CALCIUM 7.8* 7.7* 8.1*   LFT Recent Labs    02/23/20 0404  PROT 7.9  ALBUMIN 2.6*  AST 2,439*  ALT 654*  ALKPHOS 76  BILITOT 1.1   PT/INR Recent Labs    02/21/20 0434  LABPROT 20.1*  INR 1.8*   Hepatitis Panel No results for input(s): HEPBSAG, HCVAB, HEPAIGM, HEPBIGM in the last 72 hours.     IMPRESSION:   #54  69 year old male with acute transaminitis new over the past 24 hours.  This is in the setting of hypoxic respiratory failure secondary to COVID-19 pneumonia, and sepsis.  He had been hypotensive last p.m./early a.m. Abrupt rise in transaminases most consistent with shock liver/reactive secondary to sepsis. Patient has had further declined this afternoon, has required intubation and initiation of pressors.  #2 chronic kidney disease/dialysis dependent #3 metabolic encephalopathy #4 recent C-spine laminectomy and fusion 01/26/2020 secondary to MRSA osteomyelitis and epidural abscess.  Patient has been on vancomycin and rifampin. #5 adult onset diabetes mellitus #6 reported history of hep C and compensated cirrhosis #7 history of colon cancer 2013 status post right hemicolectomy-most recent colonoscopy January 2021 normal  Plan; Management is supportive, continue current aggressive measures blood pressure support. Trend LFTs Check INR Upper abdominal ultrasound when stable. Palliative care has been  consulted as prognosis very guarded  Amy EsterwoodPA-C  02/23/2020, 2:58 PM   GI ATTENDING  History, laboratories, x-rays reviewed.  Agree with comprehensive consultation note as outlined above.  We are asked to see the patient regarding elevated liver test.  This acute onset new marked hepatic transaminitis is secondary to shock liver from hypotension.  Treatment is supportive.  Overall prognosis poor, due to multiorgan system failure in the face of sepsis, as outlined above.  Nothing additional to add from GI standpoint.  Will sign off.  Docia Chuck. Geri [place]in[place]., M.D. [facility] Division of Gastroenterology

## 2020-02-23 NOTE — Progress Notes (Signed)
CBG 436. CMP modified to stat for blood glucose to be verified. Patient remains without IV access. IV team to attempt to get IV access. Marlowe Sax, MD notified and agrees with plan. [place], MD will be notified if blood sugar is verified as extremely high. Will continue to monitor.

## 2020-02-23 NOTE — Consult Note (Signed)
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  He was intubated and was transferred to ICU  History of present illness   69 year old male with end-stage renal disease on dialysis, liver cirrhosis secondary to hepatitis C, polysubstance abuse, history of colon cancer status post hemicolectomy who had C4/T2 laminectomy fusion done in August 4098, complicated with MRSA bacteremia and epidural abscess, then he was sent to skilled care nursing facility on antibiotic.  He was brought in with fever and altered mental status on 9/19 was found to have Covid positive. CT of head on admission showed no acute abnormality but cervical spine MRI showed osteomyelitis of right skull base, anterior C1 and C2, C3 joint erosion and resolved epidural abscess.  This morning patient underwent hemodialysis, when he returned back from dialysis, he was noted to be lethargic with altered mental status and hypotensive.  Blood glucose was checked it was less than 20, he was noted to be in agonal breathing with systolic blood pressure in 60s.  He was given D50 x8 and was started on D 20 infusion, rapid response was called he was started on Levophed and was intubated at bedside and then transferred to ICU. Past Medical History  End-stage renal disease on hemodialysis Liver cirrhosis due to hepatitis C Polysubstance abuse Colon cancer status post hemicolectomy Cervical spine epidural abscess  Significant Hospital Events   9/22 RRT was called, intubated and transferred to ICU  Consults:  PCCM Nephrology GI  Procedures:  ET  tube 9/22 Left IJ 9/22 Right radial A-line 9/22  Significant Diagnostic Tests:  9/19 CT spine without contrast: Postoperative findings status post posterior laminectomy and fusion of C4 through T1, with moderate disc space height loss and osteophytosis of C6-C7 and C7-T1. Prevertebral soft tissue edema appears reduced compared to prior Examination. Postoperative fluid collection overlying laminectomy site does not appear significantly changed compared to prior examination. Assessment of the soft tissues and epidural space is generally limited on noncontrast CT and additionally limited by metallic streak artifact; contrast enhanced MRI is the test of choice for evaluation of edema, fluid collections, the epidural space, and discitis osteomyelitis if clinically suspected in the postoperative setting.  9/19 CT head without contrast: No acute intracranial abnormalities. Chronic white matter changes. Chronic right thalamic lacunar infarct.  9/21 MRI cervical spine: . 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 Possible involvement of the right C2-C3 facet joint also associated erosion of bone since 01/28/2020 as demonstrated by CT two days ago. Resolved posterior epidural abscess following surgery last month. Satisfactorily decompressed levels C4-C5 through T1-T2. Improved thecal sac patency but mild residual degenerative spinal stenosis at both C2-C3 and C3-C4. No definite spinal cord signal abnormality.  Micro Data:  9/19 Covid positive 9/20 MRSA PCR negative 9/20 -9/21 blood cultures negative Antimicrobials:  9/19  Vancomycin  >>  Interim history/subjective:  Transfer to ICU, intubated  Objective   Blood pressure (!) 66/37, pulse (!) 115, temperature 98 F (36.7 C), temperature source Axillary, resp. rate 20, height 6' (1.829 m), weight 86.9 kg, SpO2 100 %.    Vent Mode: PRVC FiO2 (%):  [40 %-100 %] 40 % Set Rate:  [  20 bmp-26 bmp] 20 bmp Vt Set:  [530 mL-620 mL] 530  mL PEEP:  [5 cmH20] 5 cmH20   Intake/Output Summary (Last 24 hours) at 02/23/2020 1615 Last data filed at 02/23/2020 1600 Gross per 24 hour  Intake 378.95 ml  Output 175 ml  Net 203.95 ml   Filed Weights   02/21/20 2000 02/22/20 0607 02/23/20 0825  Weight: 81.6 kg 86.9 kg 86.9 kg    Examination:   Physical exam: General: Chronically ill-appearing male, orally intubated HEAENT: [place]/AT, eyes anicteric.  ETT and OGT in place Neuro: Sedated, not following commands.  Eyes are closed.  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 Acute metabolic acidosis (lactic acidosis) Hyponatremia Shock liver  Patient was intubated and placed on mechanical ventilation Continue dexamethasone Remdesivir was discontinued due to acute rise in LFTs Continue IV vancomycin Started on Levophed with map goal > 65 Patient was found to be severely hypoglycemic at rapid response, he received multiple doses of D50, could be related to adrenal insufficiency as he was hypotensive and hypoglycemic Received 100 mg of IV hydrocortisone Monitor fingerstick closely Nephrology has been following for hemodialysis need Patient's LFTs deranged, went up to 2000, hepatology is following, picture is consistent with shock liver Closely monitor LFTs and INR Monitor serum creatinine and trend lactate  Patient with multiple comorbidities and now with septic shock and respiratory failure.  Prognosis is poor,  palliative care consult has been requested for evaluation and goals of care discussion  Best practice:  Diet: NPO Pain/Anxiety/Delirium protocol (if indicated): Fentanyl VAP protocol (if indicated): Yes DVT prophylaxis: Subcu heparin GI prophylaxis: Protonix Glucose control: N/A Mobility: Bedrest Code Status: Full code Family Communication: Patient daughter will be updated over the phone Disposition: ICU  Labs   CBC: Recent Labs  Lab 02/20/20 1315 02/20/20 1315 02/21/20 0017 02/21/20 0017 02/21/20 0434 02/22/20 0837 02/23/20 0404 02/23/20 1531 02/23/20 1548  WBC 4.7   < > 4.5  --  5.1 5.1 7.6 15.7*  --   NEUTROABS 2.4  --   --   --  3.5 2.7 5.5  --   --   HGB 8.5*   < > 7.9*   < > 8.0* 7.6* 8.6* 7.8* 8.2*  HCT 26.7*   < > 25.0*   < > 24.8* 23.3* 25.7* 24.0* 24.0*  MCV 81.7   < > 80.1  --  77.7* 76.9* 77.2* 80.0  --   PLT 181   < > 175  --  201 238 309 364  --    < > = values in this interval not displayed.    Basic Metabolic Panel: Recent Labs  Lab 02/20/20 1315 02/21/20 0017 02/21/20 0434 02/22/20 0837 02/23/20 0404 02/23/20 1548  NA 131*  --  134* 134* 135 128*  K 6.2*  --  5.4* 3.8 4.1 3.8  CL 99  --  102 98 98  --   CO2 15*  --  13* 18* 18*  --   GLUCOSE 87  --  93 169* 107*  --  BUN 82*  --  93* 53* 70*  --   CREATININE 13.66* 14.40* 15.31* 10.29* 13.07*  --   CALCIUM 7.9*  --  7.8* 7.7* 8.1*  --   MG  --  2.0 2.1 2.0 2.2  --   PHOS  --  9.9* 10.4* 8.3* 9.5*  --    GFR: Estimated Creatinine Clearance: 5.9 mL/min (A) (by C-G formula based on SCr of 13.07 mg/dL (H)). Recent Labs  Lab 02/20/20 1315 02/21/20 0017 02/21/20 0020 02/21/20 0434 02/22/20 0837 02/23/20 0404 02/23/20 1531  PROCALCITON 0.64  --   --  0.80 1.66  --   --   WBC 4.7   < >  --  5.1 5.1 7.6 15.7*  LATICACIDVEN 1.1  --  1.8  --  2.3*  --   --    < > = values in this interval not displayed.    Liver Function Tests: Recent Labs  Lab 02/20/20 1315 02/21/20 0434  02/22/20 0837 02/23/20 0404  AST 34 33 237* 2,439*  ALT 19 18 74* 654*  ALKPHOS 81 80 68 76  BILITOT 0.8 0.8 1.1 1.1  PROT 8.0 7.9 7.3 7.9  ALBUMIN 2.5* 2.5* 2.3* 2.6*   No results for input(s): LIPASE, AMYLASE in the last 168 hours. Recent Labs  Lab 02/21/20 0019 02/23/20 0700  AMMONIA 29 32    ABG    Component Value Date/Time   PHART 7.298 (L) 02/23/2020 1548   PCO2ART 30.9 (L) 02/23/2020 1548   PO2ART 153 (H) 02/23/2020 1548   HCO3 15.2 (L) 02/23/2020 1548   TCO2 16 (L) 02/23/2020 1548   ACIDBASEDEF 10.0 (H) 02/23/2020 1548   O2SAT 99.0 02/23/2020 1548     Coagulation Profile: Recent Labs  Lab 02/21/20 0434  INR 1.8*    Cardiac Enzymes: No results for input(s): CKTOTAL, CKMB, CKMBINDEX, TROPONINI in the last 168 hours.  HbA1C: Hemoglobin A1C  Date/Time Value Ref Range Status  12/08/2013 09:21 AM 9.6  Final  09/15/2013 11:00 AM >14.0  Final   Hgb A1c MFr Bld  Date/Time Value Ref Range Status  01/12/2020 04:03 AM 6.5 (H) 4.8 - 5.6 % Final    Comment:    (NOTE) Pre diabetes:          5.7%-6.4%  Diabetes:              >6.4%  Glycemic control for   <7.0% adults with diabetes   03/02/2012 04:38 PM 6.2 (H) <5.7 % Final    Comment:    (NOTE)                                                                       According to the ADA Clinical Practice Recommendations for 2011, when HbA1c is used as a screening test:  >=6.5%   Diagnostic of Diabetes Mellitus           (if abnormal result is confirmed) 5.7-6.4%   Increased risk of developing Diabetes Mellitus References:Diagnosis and Classification of Diabetes Mellitus,Diabetes XHBZ,1696,78(LFYBO 1):S62-S69 and Standards of Medical Care in         Diabetes - 2011,Diabetes Care,2011,34 (Suppl 1):S11-S61.    CBG: Recent Labs  Lab 02/23/20 1337 02/23/20 1339 02/23/20 1341 02/23/20 1343 02/23/20 1421  GLUCAP <10* 421* 574* >600* 102*    Review of Systems:   Unable to obtain as patient is intubated  and encephalopathic.  Past Medical History  He,  has a past medical history of Anemia, Arthritis, Chronic kidney disease, Colon cancer ([place]), Colon polyp, DM II (diabetes mellitus, type II), controlled ([place]), 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 (12/25/2011), GERD (gastroesophageal reflux disease), Heart murmur, ileostomy (10/13/2012), Hyperlipidemia, Hypertension, Sleep apnea, and Wears glasses.   Surgical History    Past Surgical History:  Procedure Laterality Date  . APPLICATION OF WOUND VAC  02/16/2012   Procedure: APPLICATION OF WOUND VAC;  Surgeon: Zenovia Jarred, MD;  Location: [place];  Service: General;  Laterality: N/A;  . APPLICATION OF WOUND VAC  02/26/2012   Procedure: APPLICATION OF WOUND VAC;  Surgeon: Imogene Burn. [name], MD;  Location: [place];  Service: General;  Laterality: N/A;  . AV FISTULA PLACEMENT Left 07/29/2019   Procedure: ARTERIOVENOUS (AV) FISTULA CREATION;  Surgeon: Marty Heck, MD;  Location: [place];  Service: Vascular;  Laterality: Left;  . COLONOSCOPY    . COLONOSCOPY  02/06/2012   Procedure: COLONOSCOPY;  Surgeon: Milus Banister, MD;  Location: [place];  Service: Endoscopy;;  . COLONOSCOPY WITH PROPOFOL N/A 06/10/2019   Procedure: COLONOSCOPY WITH PROPOFOL;  Surgeon: Milus Banister, MD;  Location: [facility];  Service: Endoscopy;  Laterality: N/A;  . COLOSTOMY REVERSAL    . DRESSING CHANGE UNDER ANESTHESIA  02/18/2012   Procedure: DRESSING CHANGE UNDER ANESTHESIA;  Surgeon: Odis Hollingshead, MD;  Location: [place];  Service: General;  Laterality: N/A;  . ESOPHAGOGASTRODUODENOSCOPY (EGD) WITH PROPOFOL N/A 06/10/2019   Procedure: ESOPHAGOGASTRODUODENOSCOPY (EGD) WITH PROPOFOL;  Surgeon: Milus Banister, MD;  Location: [facility];  Service: Endoscopy;  Laterality: N/A;  . GANGLION CYST EXCISION  20 YRS AGO   RT ARM  . ILEOSTOMY  02/16/2012   Procedure: ILEOSTOMY;  Surgeon: Zenovia Jarred, MD;  Location: [place];  Service: General;  Laterality: N/A;  .  ILEOSTOMY CLOSURE N/A 09/02/2012   Procedure: ILEOSTOMY REVERSAL;  Surgeon: Imogene Burn. [name], MD;  Location: [place];  Service: General;  Laterality: N/A;  . ILIOSTOMY    . INCISION AND DRAINAGE OF WOUND  02/23/2012   Procedure: IRRIGATION AND DEBRIDEMENT WOUND;  Surgeon: Joyice Faster. Cornett, MD;  Location: [place];  Service: General;  Laterality: N/A;  . IR DIALY SHUNT INTRO [place] W/IMG LEFT Left 01/21/2020  . IR FLUORO GUIDE CV LINE RIGHT  01/21/2020  . IR REMOVAL TUN CV CATH W/O FL  01/07/2020  . IR US GUIDE VASC ACCESS LEFT  01/21/2020  . IR US GUIDE VASC ACCESS RIGHT  01/21/2020  . LAPAROTOMY  02/16/2012   Procedure: EXPLORATORY LAPAROTOMY;  Surgeon: Zenovia Jarred, MD;  Location: [place];  Service: General;  Laterality: N/A;  Exploratory Laparotomy with resection of anastomosis  . LAPAROTOMY  02/18/2012   Procedure: EXPLORATORY LAPAROTOMY;  Surgeon: Odis Hollingshead, MD;  Location: [place];  Service: General;  Laterality: N/A;  exploratory laparotomy  change of abdominal vac dressing  . LAPAROTOMY  02/20/2012   Procedure: EXPLORATORY LAPAROTOMY;  Surgeon: Odis Hollingshead, MD;  Location: [place];  Service: General;  Laterality: N/A;  . LAPAROTOMY  02/23/2012   Procedure: EXPLORATORY LAPAROTOMY;  Surgeon: Joyice Faster. Cornett, MD;  Location: [place];  Service: General;  Laterality: N/A;  Irrigation and Debridement of abdominal wound with wound vac change with partial closure  .  LAPAROTOMY  02/26/2012   Procedure: EXPLORATORY LAPAROTOMY;  Surgeon: Imogene Burn. [name], MD;  Location: [place];  Service: General;  Laterality: N/A;     . PARTIAL COLECTOMY  02/06/2012  . PARTIAL COLECTOMY  02/06/2012   Procedure: PARTIAL COLECTOMY;  Surgeon: Imogene Burn. [name], MD;  Location: [place];  Service: General;  Laterality: N/A;  right partial colectomy  . POSTERIOR CERVICAL FUSION/FORAMINOTOMY N/A 01/06/2020   Procedure: CERVICAL FOUR- THORACIC TWO LAMINECTOMY AND FUSION;  Surgeon: Consuella Lose, MD;  Location: [place];  Service: Neurosurgery;  Laterality: N/A;  CERVICAL FOUR- THORACIC TWO LAMINECTOMY AND FUSION  . RESECTION SMALL BOWEL / CLOSURE ILEOSTOMY  402/2014   Dr [name]  . VACUUM ASSISTED CLOSURE CHANGE  02/20/2012   Procedure: ABDOMINAL VACUUM ASSISTED CLOSURE CHANGE;  Surgeon: Odis Hollingshead, MD;  Location: [place];  Service: General;;  . VACUUM ASSISTED CLOSURE CHANGE  02/23/2012   Procedure: ABDOMINAL VACUUM ASSISTED CLOSURE CHANGE;  Surgeon: Joyice Faster. Cornett, MD;  Location: [facility];  Service: General;  Laterality: N/A;     Social History   reports that he has been smoking cigarettes. He has a 15.00 pack-year smoking history. He has never used smokeless tobacco. He reports current alcohol use. He reports current drug use. Drug: Cocaine.   Family History   His family history includes Diabetes in his father and sister; Heart disease in his father and sister; Hypertension in his mother. There is no history of Colon cancer.   Allergies No Known Allergies   Home Medications  Prior to Admission medications   Medication Sig Start Date End Date Taking? Authorizing Provider  amLODipine (NORVASC) 10 MG tablet Take 1 tablet (10 mg total) by mouth at bedtime. 01/24/20 02/23/20 Yes [place] Eric J, DO  atorvastatin (LIPITOR) 20 MG tablet Take 1 tablet (20 mg total) by mouth at bedtime. 01/24/20 02/23/20 Yes [place] Donnamarie Poag, DO  calcitRIOL (ROCALTROL) 0.25 MCG capsule Take 5 capsules (1.25 mcg total) by mouth 3 (three) times a week. Patient taking differently: Take 1.25 mcg by mouth every Monday, Wednesday, and Friday.  01/24/20 02/23/20 Yes [place] Eric J, DO  clonazePAM (KLONOPIN) 0.5 MG tablet Take 1 tablet (0.5 mg total) by mouth 2 (two) times daily as needed (Anxiety/agitation). 01/24/20 02/23/20 Yes [place] Eric J, DO  gabapentin (NEURONTIN) 100 MG capsule Take 100 mg by mouth at bedtime. Left arm pain for 28 days (ends 9.29.2021)   Yes [provider]  HYDROcodone-acetaminophen (NORCO/VICODIN)  5-325 MG tablet Take 1 tablet by mouth every 6 (six) hours as needed for moderate pain.   Yes [provider]  insulin detemir (LEVEMIR) 100 UNIT/ML injection Inject 0.11 mLs (11 Units total) into the skin 2 (two) times daily. 01/24/20 02/23/20 Yes [place] Eric J, DO  metoprolol tartrate (LOPRESSOR) 25 MG tablet Take 1 tablet (25 mg total) by mouth 2 (two) times daily. 01/24/20 02/23/20 Yes [place] Donnamarie Poag, DO  Multiple Vitamin (MULTIVITAMIN WITH MINERALS) TABS tablet Take 1 tablet by mouth daily.   Yes [provider]  polyethylene glycol (MIRALAX / GLYCOLAX) 17 g packet Take 17 g by mouth daily as needed for mild constipation.   Yes [provider]  QUEtiapine (SEROQUEL) 25 MG tablet Take 0.5 tablets (12.5 mg total) by mouth at bedtime. 01/24/20 02/23/20 Yes [place] Eric J, DO  rifampin (RIFADIN) 300 MG capsule Take 1 capsule (300 mg total) by mouth every 12 (twelve) hours. 01/11/90 47/8/29 Yes Delora Fuel, MD  sevelamer carbonate (RENVELA) 800 MG  tablet Take 2 tablets (1,600 mg total) by mouth 3 (three) times daily with meals. 01/24/20 02/23/20 Yes [place] Donnamarie Poag, DO  Darbepoetin Alfa (ARANESP) 100 MCG/0.5ML SOSY injection Inject 0.5 mLs (100 mcg total) into the vein every Monday with hemodialysis. Patient not taking: Reported on 02/21/2020 01/24/20   [place] Eric J, DO  multivitamin (RENA-VIT) TABS tablet Take 1 tablet by mouth at bedtime. Patient not taking: Reported on 02/21/2020 01/24/20 02/23/20  [place] Eric J, DO     Total critical care time: 55 minutes  Performed by: [place] care time was exclusive of separately billable procedures and treating other patients.   Critical care was necessary to treat or prevent imminent or life-threatening deterioration.   Critical care was time spent personally by me on the following activities: development of treatment plan with patient and/or surrogate as well as nursing, discussions with consultants, evaluation of  patient's response to treatment, examination of patient, obtaining history from patient or surrogate, ordering and performing treatments and interventions, ordering and review of laboratory studies, ordering and review of radiographic studies, pulse oximetry and re-evaluation of patient's condition.   Jacky Kindle MD Critical care physician [facility]  Pager: [phone] Mobile: [phone]

## 2020-02-24 ENCOUNTER — Inpatient Hospital Stay (HOSPITAL_COMMUNITY): Payer: No Typology Code available for payment source

## 2020-02-24 ENCOUNTER — Encounter (HOSPITAL_COMMUNITY): Payer: Self-pay | Admitting: Internal Medicine

## 2020-02-24 DIAGNOSIS — G9341 Metabolic encephalopathy: Secondary | ICD-10-CM | POA: Diagnosis not present

## 2020-02-24 DIAGNOSIS — Z515 Encounter for palliative care: Secondary | ICD-10-CM

## 2020-02-24 DIAGNOSIS — U071 COVID-19: Secondary | ICD-10-CM | POA: Diagnosis not present

## 2020-02-24 DIAGNOSIS — Z87898 Personal history of other specified conditions: Secondary | ICD-10-CM | POA: Diagnosis not present

## 2020-02-24 DIAGNOSIS — N186 End stage renal disease: Secondary | ICD-10-CM | POA: Diagnosis not present

## 2020-02-24 DIAGNOSIS — Z7189 Other specified counseling: Secondary | ICD-10-CM

## 2020-02-24 LAB — COMPREHENSIVE METABOLIC PANEL
ALT: 654 U/L — ABNORMAL HIGH (ref 0–44)
ALT: 1074 U/L — ABNORMAL HIGH (ref 0–44)
AST: 2439 U/L — ABNORMAL HIGH (ref 15–41)
AST: 4427 U/L — ABNORMAL HIGH (ref 15–41)
Albumin: 2.6 g/dL — ABNORMAL LOW (ref 3.5–5.0)
Albumin: 2.6 g/dL — ABNORMAL LOW (ref 3.5–5.0)
Alkaline Phosphatase: 76 U/L (ref 38–126)
Alkaline Phosphatase: 95 U/L (ref 38–126)
Anion gap: 19 — ABNORMAL HIGH (ref 5–15)
Anion gap: 21 — ABNORMAL HIGH (ref 5–15)
BUN: 70 mg/dL — ABNORMAL HIGH (ref 8–23)
BUN: 31 mg/dL — ABNORMAL HIGH (ref 8–23)
CO2: 18 mmol/L — ABNORMAL LOW (ref 22–32)
CO2: 14 mmol/L — ABNORMAL LOW (ref 22–32)
Calcium: 8.1 mg/dL — ABNORMAL LOW (ref 8.9–10.3)
Calcium: 8.2 mg/dL — ABNORMAL LOW (ref 8.9–10.3)
Chloride: 98 mmol/L (ref 98–111)
Chloride: 92 mmol/L — ABNORMAL LOW (ref 98–111)
Creatinine, Ser: 13.07 mg/dL — ABNORMAL HIGH (ref 0.61–1.24)
Creatinine, Ser: 7.22 mg/dL — ABNORMAL HIGH (ref 0.61–1.24)
GFR calc Af Amer: 4 mL/min — ABNORMAL LOW (ref >60)
GFR calc Af Amer: 8 mL/min — ABNORMAL LOW (ref >60)
GFR calc non Af Amer: 3 mL/min — ABNORMAL LOW (ref >60)
GFR calc non Af Amer: 7 mL/min — ABNORMAL LOW (ref >60)
Glucose, Bld: 107 mg/dL — ABNORMAL HIGH (ref 70–99)
Glucose, Bld: 698 mg/dL (ref 70–99)
Potassium: 4.1 mmol/L (ref 3.5–5.1)
Potassium: 5.1 mmol/L (ref 3.5–5.1)
Sodium: 135 mmol/L (ref 135–145)
Sodium: 127 mmol/L — ABNORMAL LOW (ref 135–145)
Total Bilirubin: 1.1 mg/dL (ref 0.3–1.2)
Total Bilirubin: 1.3 mg/dL — ABNORMAL HIGH (ref 0.3–1.2)
Total Protein: 7.9 g/dL (ref 6.5–8.1)
Total Protein: 7 g/dL (ref 6.5–8.1)

## 2020-02-24 LAB — GLUCOSE, CAPILLARY
Glucose-Capillary: 113 mg/dL — ABNORMAL HIGH (ref 70–99)
Glucose-Capillary: 131 mg/dL — ABNORMAL HIGH (ref 70–99)
Glucose-Capillary: 137 mg/dL — ABNORMAL HIGH (ref 70–99)
Glucose-Capillary: 152 mg/dL — ABNORMAL HIGH (ref 70–99)
Glucose-Capillary: 161 mg/dL — ABNORMAL HIGH (ref 70–99)
Glucose-Capillary: 161 mg/dL — ABNORMAL HIGH (ref 70–99)
Glucose-Capillary: 165 mg/dL — ABNORMAL HIGH (ref 70–99)
Glucose-Capillary: 172 mg/dL — ABNORMAL HIGH (ref 70–99)
Glucose-Capillary: 212 mg/dL — ABNORMAL HIGH (ref 70–99)
Glucose-Capillary: 270 mg/dL — ABNORMAL HIGH (ref 70–99)
Glucose-Capillary: 345 mg/dL — ABNORMAL HIGH (ref 70–99)
Glucose-Capillary: 418 mg/dL — ABNORMAL HIGH (ref 70–99)
Glucose-Capillary: 451 mg/dL — ABNORMAL HIGH (ref 70–99)
Glucose-Capillary: 456 mg/dL — ABNORMAL HIGH (ref 70–99)
Glucose-Capillary: >600 mg/dL (ref 70–99)

## 2020-02-24 LAB — CBC WITH DIFFERENTIAL/PLATELET
Abs Immature Granulocytes: 0 10*3/uL (ref 0.00–0.07)
Basophils Absolute: 0 10*3/uL (ref 0.0–0.1)
Basophils Relative: 0 %
Eosinophils Absolute: 0 10*3/uL (ref 0.0–0.5)
Eosinophils Relative: 0 %
HCT: 26.2 % — ABNORMAL LOW (ref 39.0–52.0)
Hemoglobin: 8.7 g/dL — ABNORMAL LOW (ref 13.0–17.0)
Lymphocytes Relative: 4 %
Lymphs Abs: 1 10*3/uL (ref 0.7–4.0)
MCH: 26.5 pg (ref 26.0–34.0)
MCHC: 33.2 g/dL (ref 30.0–36.0)
MCV: 79.9 fL — ABNORMAL LOW (ref 80.0–100.0)
Monocytes Absolute: 0.2 10*3/uL (ref 0.1–1.0)
Monocytes Relative: 1 %
Neutro Abs: 22.9 10*3/uL — ABNORMAL HIGH (ref 1.7–7.7)
Neutrophils Relative %: 95 %
Platelets: 386 10*3/uL (ref 150–400)
RBC: 3.28 MIL/uL — ABNORMAL LOW (ref 4.22–5.81)
RDW: 18.9 % — ABNORMAL HIGH (ref 11.5–15.5)
WBC: 24.1 10*3/uL — ABNORMAL HIGH (ref 4.0–10.5)
nRBC: 0.8 % — ABNORMAL HIGH (ref 0.0–0.2)
nRBC: 3 /100 WBC — ABNORMAL HIGH

## 2020-02-24 LAB — POCT I-STAT 7, (LYTES, BLD GAS, ICA,H+H)
Acid-base deficit: 8 mmol/L — ABNORMAL HIGH (ref 0.0–2.0)
Bicarbonate: 17 mmol/L — ABNORMAL LOW (ref 20.0–28.0)
Calcium, Ion: 1.06 mmol/L — ABNORMAL LOW (ref 1.15–1.40)
HCT: 27 % — ABNORMAL LOW (ref 39.0–52.0)
Hemoglobin: 9.2 g/dL — ABNORMAL LOW (ref 13.0–17.0)
O2 Saturation: 94 %
Patient temperature: 97.8
Potassium: 4.6 mmol/L (ref 3.5–5.1)
Sodium: 128 mmol/L — ABNORMAL LOW (ref 135–145)
TCO2: 18 mmol/L — ABNORMAL LOW (ref 22–32)
pCO2 arterial: 29.7 mmHg — ABNORMAL LOW (ref 32.0–48.0)
pH, Arterial: 7.364 (ref 7.350–7.450)
pO2, Arterial: 69 mmHg — ABNORMAL LOW (ref 83.0–108.0)

## 2020-02-24 LAB — C-REACTIVE PROTEIN: CRP: 10.7 mg/dL — ABNORMAL HIGH (ref <1.0)

## 2020-02-24 LAB — MAGNESIUM
Magnesium: 2 mg/dL (ref 1.7–2.4)
Magnesium: 1.9 mg/dL (ref 1.7–2.4)

## 2020-02-24 LAB — PHOSPHORUS
Phosphorus: 6.9 mg/dL — ABNORMAL HIGH (ref 2.5–4.6)
Phosphorus: 5.4 mg/dL — ABNORMAL HIGH (ref 2.5–4.6)

## 2020-02-24 LAB — D-DIMER, QUANTITATIVE: D-Dimer, Quant: 7.31 ug/mL-FEU — ABNORMAL HIGH (ref 0.00–0.50)

## 2020-02-24 LAB — FERRITIN: Ferritin: >7500 ng/mL — ABNORMAL HIGH (ref 24–336)

## 2020-02-24 IMAGING — DX DG CHEST 1V PORT
1 series · 1 of 1 positions shown · non-contrast
Comparison: [DATE]

CLINICAL DATA: [XU] positive [DATE], respiratory failure,
intubated

EXAM:
PORTABLE CHEST 1 VIEW

[chest]
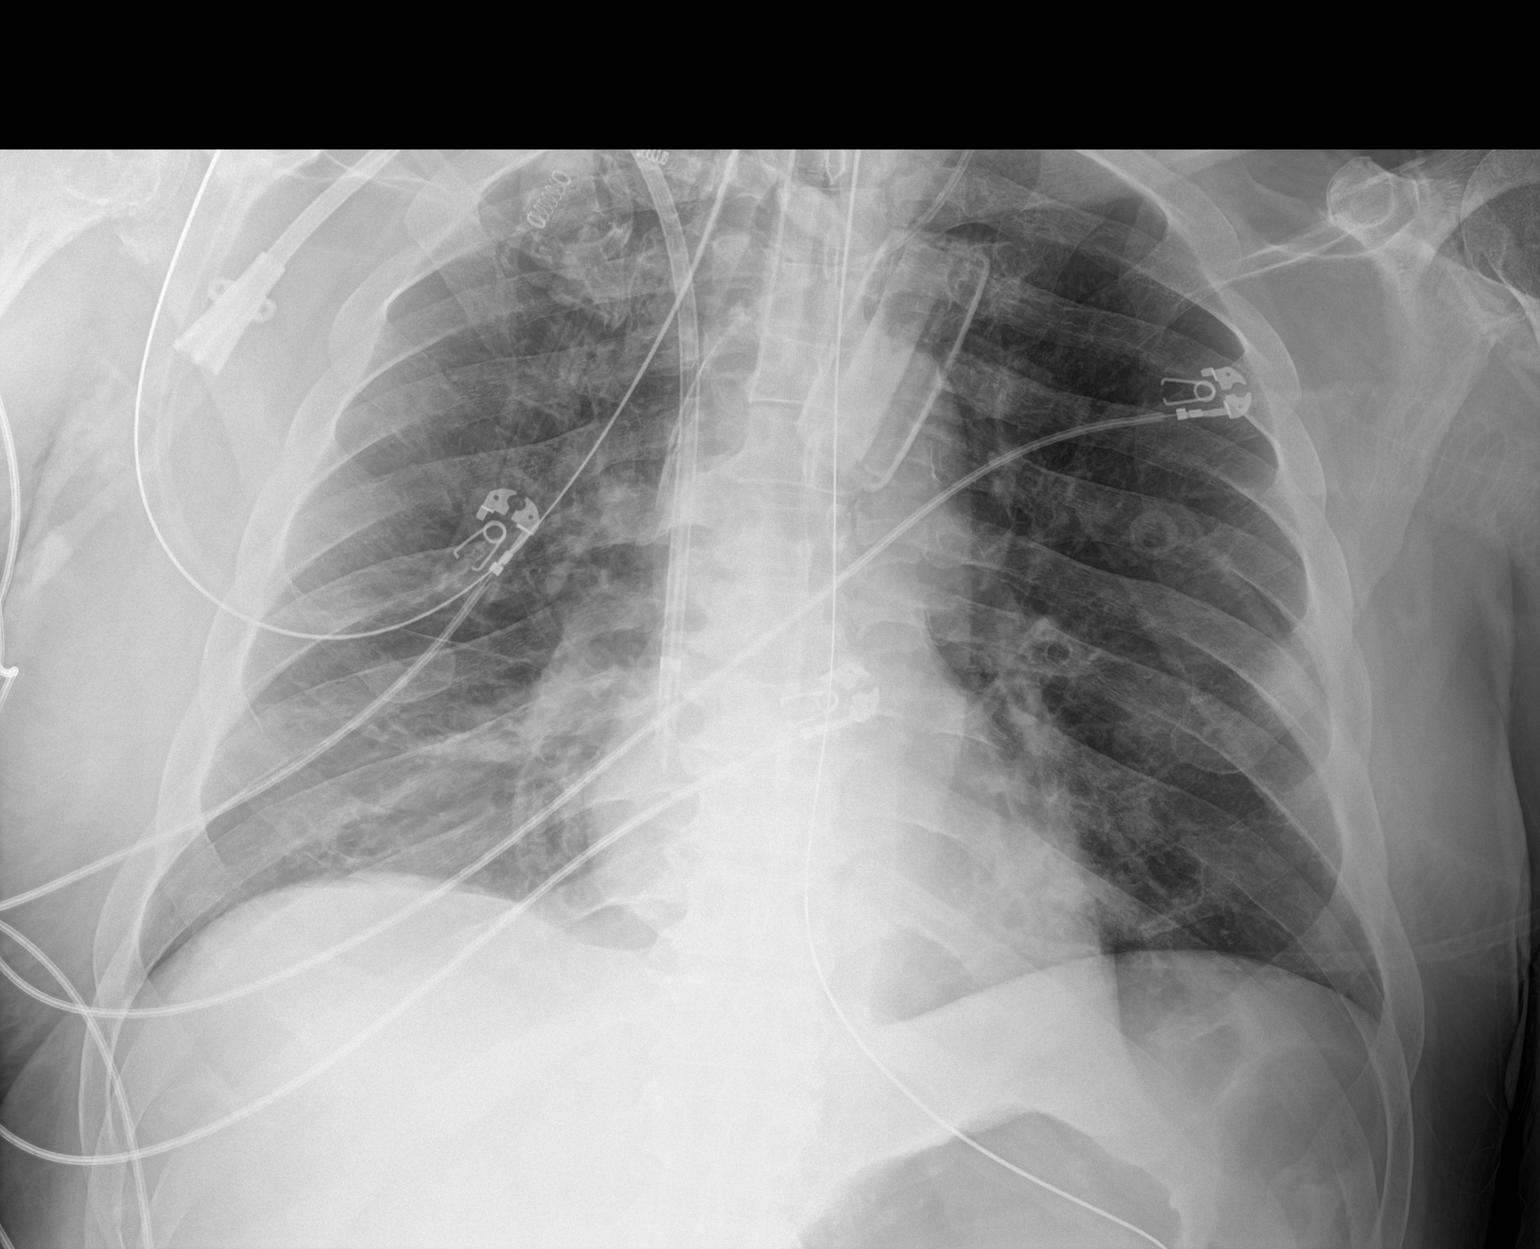

[1 of 1 positions shown; findings below may reference images not displayed]

FINDINGS: Single frontal view of the chest demonstrates endotracheal tube well
above carina. Enteric catheter passes below diaphragm tip excluded
by collimation. Bilateral internal jugular catheters are unchanged.
Cardiac silhouette is stable. Persistent patchy consolidation at the
lung bases, unchanged. No effusion or pneumothorax. No acute bony
abnormalities.
IMPRESSION: 1. Stable support devices.
2. Persistent bibasilar patchy consolidation which could reflect
atelectasis or pneumonia.

## 2020-02-24 MED ORDER — INSULIN REGULAR(HUMAN) IN NACL 100-0.9 UT/100ML-% IV SOLN
INTRAVENOUS | Status: DC
  Administered 2020-02-24: 14 [IU]/h via INTRAVENOUS
  Administered 2020-02-25: 4.2 [IU]/h via INTRAVENOUS
  Filled 2020-02-24 (×2): qty 100

## 2020-02-24 MED ORDER — ONDANSETRON HCL 4 MG/2ML IJ SOLN: 4.0000 mg | Freq: Four times a day (QID) | INTRAMUSCULAR | Status: DC | PRN

## 2020-02-24 MED ORDER — INSULIN ASPART 100 UNIT/ML ~~LOC~~ SOLN: 0.0000 [IU] | Freq: Three times a day (TID) | SUBCUTANEOUS | Status: DC

## 2020-02-24 MED ORDER — SODIUM CHLORIDE 0.9 % IV SOLN
2.0000 g | Freq: Once | INTRAVENOUS | Status: AC
  Administered 2020-02-24: 2 g via INTRAVENOUS
  Filled 2020-02-24: qty 20

## 2020-02-24 MED ORDER — HYDROCOD POLST-CPM POLST ER 10-8 MG/5ML PO SUER: 5.0000 mL | Freq: Two times a day (BID) | ORAL | Status: DC | PRN

## 2020-02-24 MED ORDER — PANTOPRAZOLE SODIUM 40 MG PO PACK
40.0000 mg | PACK | Freq: Every day | ORAL | Status: DC
  Administered 2020-02-25 – 2020-03-03 (×7): 40 mg
  Filled 2020-02-24 (×10): qty 20

## 2020-02-24 MED ORDER — DEXMEDETOMIDINE HCL IN NACL 400 MCG/100ML IV SOLN
0.4000 ug/kg/h | INTRAVENOUS | Status: DC
  Administered 2020-02-24: 1.2 ug/kg/h via INTRAVENOUS
  Administered 2020-02-24: 0.8 ug/kg/h via INTRAVENOUS
  Administered 2020-02-24: 0.7 ug/kg/h via INTRAVENOUS
  Administered 2020-02-25: 1.2 ug/kg/h via INTRAVENOUS
  Administered 2020-02-25: 1 ug/kg/h via INTRAVENOUS
  Administered 2020-02-25: 1.2 ug/kg/h via INTRAVENOUS
  Administered 2020-02-26: 0.8 ug/kg/h via INTRAVENOUS
  Administered 2020-02-26: 1 ug/kg/h via INTRAVENOUS
  Administered 2020-02-26: 0.4 ug/kg/h via INTRAVENOUS
  Administered 2020-02-27: 0.5 ug/kg/h via INTRAVENOUS
  Administered 2020-02-27: 0.4 ug/kg/h via INTRAVENOUS
  Filled 2020-02-24 (×12): qty 100

## 2020-02-24 MED ORDER — ONDANSETRON HCL 4 MG PO TABS: 4.0000 mg | ORAL_TABLET | Freq: Four times a day (QID) | ORAL | Status: DC | PRN

## 2020-02-24 MED ORDER — CALCITRIOL 0.25 MCG PO CAPS
1.5000 ug | ORAL_CAPSULE | ORAL | Status: DC
  Administered 2020-02-25: 1.5 ug
  Filled 2020-02-24 (×2): qty 6

## 2020-02-24 MED ORDER — ZINC SULFATE 220 (50 ZN) MG PO CAPS
220.0000 mg | ORAL_CAPSULE | Freq: Every day | ORAL | Status: DC
  Administered 2020-02-25 – 2020-03-02 (×6): 220 mg
  Filled 2020-02-24 (×9): qty 1

## 2020-02-24 MED ORDER — HYDROCORTISONE NA SUCCINATE PF 100 MG IJ SOLR
50.0000 mg | Freq: Four times a day (QID) | INTRAMUSCULAR | Status: DC
  Administered 2020-02-24 – 2020-02-26 (×9): 50 mg via INTRAVENOUS
  Filled 2020-02-24 (×9): qty 2

## 2020-02-24 MED ORDER — SEVELAMER CARBONATE 800 MG PO TABS
1600.0000 mg | ORAL_TABLET | Freq: Three times a day (TID) | ORAL | Status: DC
  Administered 2020-02-24 – 2020-02-28 (×9): 1600 mg
  Filled 2020-02-24 (×9): qty 2

## 2020-02-24 MED ORDER — RENA-VITE PO TABS
1.0000 | ORAL_TABLET | Freq: Every day | ORAL | Status: DC
  Administered 2020-02-24 – 2020-03-05 (×9): 1
  Filled 2020-02-24 (×9): qty 1

## 2020-02-24 MED ORDER — VITAL 1.5 CAL PO LIQD
1000.0000 mL | ORAL | Status: DC
  Administered 2020-02-24 – 2020-02-28 (×3): 1000 mL
  Filled 2020-02-24: qty 1000

## 2020-02-24 MED ORDER — GUAIFENESIN-DM 100-10 MG/5ML PO SYRP
10.0000 mL | ORAL_SOLUTION | ORAL | Status: DC | PRN
  Filled 2020-02-24: qty 10

## 2020-02-24 MED ORDER — SODIUM BICARBONATE 8.4 % IV SOLN
100.0000 meq | Freq: Once | INTRAVENOUS | Status: AC
  Administered 2020-02-24: 100 meq via INTRAVENOUS
  Filled 2020-02-24: qty 100

## 2020-02-24 MED ORDER — PROSOURCE TF PO LIQD
90.0000 mL | Freq: Three times a day (TID) | ORAL | Status: DC
  Administered 2020-02-24 – 2020-02-28 (×7): 90 mL
  Filled 2020-02-24 (×8): qty 90

## 2020-02-24 NOTE — Progress Notes (Signed)
[facility] Progress Note Patient Name: [name]. DOB: 09-20-1950 MRN: [id number]   Date of Service  02/24/2020  HPI/Events of Note  Glucose remain > 600 on low dose SSI  eICU Interventions  Shifted to high dose SSI     Intervention Category Major Interventions: Hyperglycemia - active titration of insulin therapy  Judd Lien 02/24/2020, 3:56 AM

## 2020-02-24 NOTE — Progress Notes (Signed)
NAME:  Rice Walsh., MRN:  [id number], DOB:  [birth year], LOS: 4 ADMISSION DATE:  02/20/2020, CONSULTATION DATE: 02/23/2020 REFERRING MD: Albertine Patricia, MD, CHIEF COMPLAINT: Altered mental status/hypotension  Brief History   69 year old male with end-stage renal disease and liver cirrhosis from hep C, recently diagnosed with cervical epidural abscess, rapid response was called for altered mental status, shock and hypoxemia.  He was intubated and was transferred to ICU  Past Medical History  End-stage renal disease on hemodialysis Liver cirrhosis due to hepatitis C Polysubstance abuse Colon cancer status post hemicolectomy Cervical spine epidural abscess  Significant Hospital Events   9/22 RRT was called, intubated and transferred to ICU  Consults:  PCCM Nephrology GI  Procedures:  ET tube 9/22 Left IJ 9/22 Right radial A-line 9/22  Significant Diagnostic Tests:  9/19 CT spine without contrast: Postoperative findings status post posterior laminectomy and fusion of C4 through T1, with moderate disc space height loss and osteophytosis of C6-C7 and C7-T1. Prevertebral soft tissue edema appears reduced compared to prior Examination. Postoperative fluid collection overlying laminectomy site does not appear significantly changed compared to prior examination. Assessment of the soft tissues and epidural space is generally limited on noncontrast CT and additionally limited by metallic streak artifact; contrast enhanced MRI is the test of choice for evaluation of edema, fluid collections, the epidural space, and discitis osteomyelitis if clinically suspected in the postoperative setting.  9/19 CT head without contrast: No acute intracranial abnormalities. Chronic white matter changes. Chronic right thalamic lacunar infarct.  9/21 MRI cervical spine: . 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 Possible involvement of the right C2-C3 facet joint also  associated erosion of bone since 01/28/2020 as demonstrated by CT two days ago. Resolved posterior epidural abscess following surgery last month. Satisfactorily decompressed levels C4-C5 through T1-T2. Improved thecal sac patency but mild residual degenerative spinal stenosis at both C2-C3 and C3-C4. No definite spinal cord signal abnormality.  Micro Data:  9/19 Covid positive 9/20 MRSA PCR negative 9/20 -9/21 blood cultures negative Antimicrobials:  9/19  Vancomycin  >>  Interim history/subjective:  Last night patient was requiring high-dose vasopressors, vasopressin was added.  He remained septic  Objective   Blood pressure 140/72, pulse 66, temperature 97.8 F (36.6 C), temperature source Axillary, resp. rate (!) 21, height 6' (1.829 m), weight 86.2 kg, SpO2 100 %.    Vent Mode: PRVC FiO2 (%):  [30 %-100 %] 30 % Set Rate:  [20 bmp-26 bmp] 20 bmp Vt Set:  [530 mL-620 mL] 530 mL PEEP:  [5 cmH20] 5 cmH20 Plateau Pressure:  [13 cmH20-16 cmH20] 13 cmH20   Intake/Output Summary (Last 24 hours) at 02/24/2020 1052 Last data filed at 02/24/2020 9233 Gross per 24 hour  Intake 1473.4 ml  Output 675 ml  Net 798.4 ml   Filed Weights   02/23/20 0825 02/23/20 1159 02/24/20 0500  Weight: 86.9 kg 87 kg 86.2 kg    Examination:   Physical exam: General: Chronically ill-appearing male, orally intubated HEAENT: [place]/AT, eyes anicteric.  ETT and OGT in place Neuro: Sedated, not following commands.  Eyes are closed.  Pupils pinpoint mm sluggishly bilateral reactive to light Chest: Coarse breath sounds, no wheezes or rhonchi Heart: Regular rate and rhythm, no murmurs or gallops Abdomen: Soft, nontender, nondistended, bowel sounds present Skin: No rash  Resolved Hospital Problem list   N/A  Assessment & Plan:  Acute hypoxic respiratory failure due to COVID-19 pneumonia Septic shock due to MRSA cervical  spinal epidural abscess/osteomyelitis of the skull base Acute metabolic  encephalopathy Poorly controlled diabetes with initial episodes of hypoglycemia and now with hyperglycemia End-stage renal disease on hemodialysis 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 Acute metabolic acidosis (lactic acidosis) Hyponatremia Shock liver  Hyperphosphatemia  Continue lung protective ventilation with low tidal volume, currently on 6 cc/kg Dexamethasone was discontinued and switched to hydrocortisone because of septic shock Remdesivir was discontinued due to acute rise in LFTs Continue IV vancomycin Vasopressin was discontinued this morning, continue Levophed with map goal > 65 Initially patient was hypoglycemic, required multiple D50 pushes and D 20 infusion, now he is hyperglycemic, started on insulin infusion with hyperglycemia protocol Monitor fingerstick closely Nephrology has been following for hemodialysis need Patient's LFTs are trending up, now in 4000, hepatology is following, picture is consistent with shock liver Closely monitor LFTs and INR Monitor serum creatinine and trend lactate Continue phosphate binders  Poor prognosis  Best practice:  Diet: NPO Pain/Anxiety/Delirium protocol (if indicated): Fentanyl VAP protocol (if indicated): Yes DVT prophylaxis: Subcu heparin GI prophylaxis: Protonix Glucose control: N/A Mobility: Bedrest Code Status: Full code Family Communication: Patient's family will be updated over the phone Disposition: ICU  Labs   CBC: Recent Labs  Lab 02/20/20 1315 02/21/20 0017 02/21/20 0434 02/21/20 0434 02/22/20 0837 02/22/20 0837 02/23/20 0404 02/23/20 1531 02/23/20 1548 02/24/20 0330 02/24/20 0859  WBC 4.7   < > 5.1  --  5.1  --  7.6 15.7*  --  24.1*  --   NEUTROABS 2.4  --  3.5  --  2.7  --  5.5  --   --  22.9*  --   HGB 8.5*   < > 8.0*   < > 7.6*   < > 8.6* 7.8* 8.2* 8.7* 9.2*  HCT 26.7*   < > 24.8*   < > 23.3*   < > 25.7* 24.0* 24.0* 26.2* 27.0*  MCV 81.7   < > 77.7*  --  76.9*  --  77.2* 80.0  --  79.9*  --   PLT 181    < > 201  --  238  --  309 364  --  386  --    < > = values in this interval not displayed.    Basic Metabolic Panel: Recent Labs  Lab 02/20/20 1315 02/21/20 0017 02/21/20 0434 02/21/20 0434 02/22/20 0837 02/22/20 0837 02/23/20 0404 02/23/20 1531 02/23/20 1548 02/24/20 0330 02/24/20 0859  NA   < >  --  134*   < > 134*   < > 135 129* 128* 127* 128*  K   < >  --  5.4*   < > 3.8   < > 4.1 3.6 3.8 5.1 4.6  CL   < >  --  102  --  98  --  98 90*  --  92*  --   CO2   < >  --  13*  --  18*  --  18* 15*  --  14*  --   GLUCOSE   < >  --  93  --  169*  --  107* 723*  --  698*  --   BUN   < >  --  93*  --  53*  --  70* 22  --  31*  --   CREATININE   < > 14.40* 15.31*  --  10.29*  --  13.07* 5.95*  --  7.22*  --   CALCIUM   < >  --  7.8*  --  7.7*  --  8.1* 8.0*  --  8.2*  --   MG   < > 2.0 2.1  --  2.0  --  2.2 1.6*  --  2.0  --   PHOS  --  9.9* 10.4*  --  8.3*  --  9.5*  --   --  6.9*  --    < > = values in this interval not displayed.   GFR: Estimated Creatinine Clearance: 10.7 mL/min (A) (by C-G formula based on SCr of 7.22 mg/dL (H)). Recent Labs  Lab 02/20/20 1315 02/21/20 0017 02/21/20 0020 02/21/20 0434 02/21/20 0434 02/22/20 0837 02/23/20 0404 02/23/20 1531 02/24/20 0330  PROCALCITON 0.64  --   --  0.80  --  1.66  --   --   --   WBC 4.7   < >  --  5.1   < > 5.1 7.6 15.7* 24.1*  LATICACIDVEN 1.1  --  1.8  --   --  2.3*  --   --   --    < > = values in this interval not displayed.    Liver Function Tests: Recent Labs  Lab 02/21/20 0434 02/22/20 0837 02/23/20 0404 02/23/20 1531 02/24/20 0330  AST 33 237* 2,439* 2,574* 4,427*  ALT 18 74* 654* 717* 1,074*  ALKPHOS 80 68 76 66 95  BILITOT 0.8 1.1 1.1 1.0 1.3*  PROT 7.9 7.3 7.9 6.2* 7.0  ALBUMIN 2.5* 2.3* 2.6* 2.0* 2.6*   No results for input(s): LIPASE, AMYLASE in the last 168 hours. Recent Labs  Lab 02/21/20 0019 02/23/20 0700  AMMONIA 29 32    ABG    Component Value Date/Time   PHART 7.364 02/24/2020  0859   PCO2ART 29.7 (L) 02/24/2020 0859   PO2ART 69 (L) 02/24/2020 0859   HCO3 17.0 (L) 02/24/2020 0859   TCO2 18 (L) 02/24/2020 0859   ACIDBASEDEF 8.0 (H) 02/24/2020 0859   O2SAT 94.0 02/24/2020 0859     Coagulation Profile: Recent Labs  Lab 02/21/20 0434  INR 1.8*    Cardiac Enzymes: No results for input(s): CKTOTAL, CKMB, CKMBINDEX, TROPONINI in the last 168 hours.  HbA1C: Hemoglobin A1C  Date/Time Value Ref Range Status  12/08/2013 09:21 AM 9.6  Final  09/15/2013 11:00 AM >14.0  Final   Hgb A1c MFr Bld  Date/Time Value Ref Range Status  01/12/2020 04:03 AM 6.5 (H) 4.8 - 5.6 % Final    Comment:    (NOTE) Pre diabetes:          5.7%-6.4%  Diabetes:              >6.4%  Glycemic control for   <7.0% adults with diabetes   03/02/2012 04:38 PM 6.2 (H) <5.7 % Final    Comment:    (NOTE)                                                                       According to the ADA Clinical Practice Recommendations for 2011, when HbA1c is used as a screening test:  >=6.5%   Diagnostic of Diabetes Mellitus           (if abnormal result is confirmed) 5.7-6.4%   Increased risk of developing  Diabetes Mellitus References:Diagnosis and Classification of Diabetes Mellitus,Diabetes ASTM,1962,22(LNLGX 1):S62-S69 and Standards of Medical Care in         Diabetes - 2011,Diabetes QJJH,4174,08 (Suppl 1):S11-S61.    CBG: Recent Labs  Lab 02/23/20 1951 02/23/20 2354 02/24/20 0312 02/24/20 0753 02/24/20 0755  GLUCAP >600* >600* >600* >600* 456*    Review of Systems:   Unable to obtain as patient is intubated and encephalopathic.  Past Medical History  He,  has a past medical history of Anemia, Arthritis, Chronic kidney disease, Colon cancer ([place]), Colon polyp, DM II (diabetes mellitus, type II), controlled ([place]), 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 (12/25/2011), GERD (gastroesophageal reflux disease), Heart murmur, ileostomy (10/13/2012), Hyperlipidemia,  Hypertension, Sleep apnea, and Wears glasses.   Surgical History    Past Surgical History:  Procedure Laterality Date  . APPLICATION OF WOUND VAC  02/16/2012   Procedure: APPLICATION OF WOUND VAC;  Surgeon: Zenovia Jarred, MD;  Location: [facility];  Service: General;  Laterality: N/A;  . APPLICATION OF WOUND VAC  02/26/2012   Procedure: APPLICATION OF WOUND VAC;  Surgeon: Imogene Burn. [name], MD;  Location: [facility];  Service: General;  Laterality: N/A;  . AV FISTULA PLACEMENT Left 07/29/2019   Procedure: ARTERIOVENOUS (AV) FISTULA CREATION;  Surgeon: Marty Heck, MD;  Location: [facility];  Service: Vascular;  Laterality: Left;  . COLONOSCOPY    . COLONOSCOPY  02/06/2012   Procedure: COLONOSCOPY;  Surgeon: Milus Banister, MD;  Location: [facility];  Service: Endoscopy;;  . COLONOSCOPY WITH PROPOFOL N/A 06/10/2019   Procedure: COLONOSCOPY WITH PROPOFOL;  Surgeon: Milus Banister, MD;  Location: [facility];  Service: Endoscopy;  Laterality: N/A;  . COLOSTOMY REVERSAL    . DRESSING CHANGE UNDER ANESTHESIA  02/18/2012   Procedure: DRESSING CHANGE UNDER ANESTHESIA;  Surgeon: Odis Hollingshead, MD;  Location: [facility];  Service: General;  Laterality: N/A;  . ESOPHAGOGASTRODUODENOSCOPY (EGD) WITH PROPOFOL N/A 06/10/2019   Procedure: ESOPHAGOGASTRODUODENOSCOPY (EGD) WITH PROPOFOL;  Surgeon: Milus Banister, MD;  [facility]: [facility];  Service: Endoscopy;  Laterality: N/A;  . GANGLION CYST EXCISION  20 YRS AGO   RT ARM  . ILEOSTOMY  02/16/2012   Procedure: ILEOSTOMY;  Surgeon: Zenovia Jarred, MD;  Location: [facility];  Service: General;  Laterality: N/A;  . ILEOSTOMY CLOSURE N/A 09/02/2012   Procedure: ILEOSTOMY REVERSAL;  Surgeon: Imogene Burn. [name], MD;  Location: [facility];  Service: General;  Laterality: N/A;  . ILIOSTOMY    . INCISION AND DRAINAGE OF WOUND  02/23/2012   Procedure: IRRIGATION AND DEBRIDEMENT WOUND;  Surgeon: Joyice Faster. Cornett, MD;  Location: [facility];  Service: General;  Laterality: N/A;  . IR DIALY  SHUNT INTRO [place] W/IMG LEFT Left 01/21/2020  . IR FLUORO GUIDE CV LINE RIGHT  01/21/2020  . IR REMOVAL TUN CV CATH W/O FL  01/07/2020  . IR US GUIDE VASC ACCESS LEFT  01/21/2020  . IR US GUIDE VASC ACCESS RIGHT  01/21/2020  . LAPAROTOMY  02/16/2012   Procedure: EXPLORATORY LAPAROTOMY;  Surgeon: Zenovia Jarred, MD;  Location: [facility];  Service: General;  Laterality: N/A;  Exploratory Laparotomy with resection of anastomosis  . LAPAROTOMY  02/18/2012   Procedure: EXPLORATORY LAPAROTOMY;  Surgeon: Odis Hollingshead, MD;  Location: [facility];  Service: General;  Laterality: N/A;  exploratory laparotomy  change of abdominal vac dressing  . LAPAROTOMY  02/20/2012   Procedure: EXPLORATORY LAPAROTOMY;  Surgeon: Odis Hollingshead, MD;  Location: [facility];  Service: General;  Laterality: N/A;  . LAPAROTOMY  02/23/2012   Procedure: EXPLORATORY LAPAROTOMY;  Surgeon: Joyice Faster. Cornett, MD;  Location: [facility];  Service: General;  Laterality: N/A;  Irrigation and Debridement of abdominal wound with wound vac change with partial closure  . LAPAROTOMY  02/26/2012   Procedure: EXPLORATORY LAPAROTOMY;  Surgeon: Imogene Burn. [name], MD;  Location: [place] [facility];  Service: General;  Laterality: N/A;     . PARTIAL COLECTOMY  02/06/2012  . PARTIAL COLECTOMY  02/06/2012   Procedure: PARTIAL COLECTOMY;  Surgeon: Imogene Burn. [name], MD;  Location: [facility];  Service: General;  Laterality: N/A;  right partial colectomy  . POSTERIOR CERVICAL FUSION/FORAMINOTOMY N/A 01/06/2020   Procedure: CERVICAL FOUR- THORACIC TWO LAMINECTOMY AND FUSION;  Surgeon: Consuella Lose, MD;  Location: [facility];  Service: Neurosurgery;  Laterality: N/A;  CERVICAL FOUR- THORACIC TWO LAMINECTOMY AND FUSION  . RESECTION SMALL BOWEL / CLOSURE ILEOSTOMY  402/2014   Dr [name]  . VACUUM ASSISTED CLOSURE CHANGE  02/20/2012   Procedure: ABDOMINAL VACUUM ASSISTED CLOSURE CHANGE;  Surgeon: Odis Hollingshead, MD;  Location: [facility];  Service: General;;  . VACUUM ASSISTED  CLOSURE CHANGE  02/23/2012   Procedure: ABDOMINAL VACUUM ASSISTED CLOSURE CHANGE;  Surgeon: Joyice Faster. Cornett, MD;  Location: [facility];  Service: General;  Laterality: N/A;     Social History   reports that he has been smoking cigarettes. He has a 15.00 pack-year smoking history. He has never used smokeless tobacco. He reports current alcohol use. He reports current drug use. Drug: Cocaine.   Family History   His family history includes Diabetes in his father and sister; Heart disease in his father and sister; Hypertension in his mother. There is no history of Colon cancer.   Allergies No Known Allergies   Home Medications  Prior to Admission medications   Medication Sig Start Date End Date Taking? Authorizing Provider  amLODipine (NORVASC) 10 MG tablet Take 1 tablet (10 mg total) by mouth at bedtime. 01/24/20 02/23/20 Yes [place] Eric J, DO  atorvastatin (LIPITOR) 20 MG tablet Take 1 tablet (20 mg total) by mouth at bedtime. 01/24/20 02/23/20 Yes [place] Donnamarie Poag, DO  calcitRIOL (ROCALTROL) 0.25 MCG capsule Take 5 capsules (1.25 mcg total) by mouth 3 (three) times a week. Patient taking differently: Take 1.25 mcg by mouth every Monday, Wednesday, and Friday.  01/24/20 02/23/20 Yes [place] Eric J, DO  clonazePAM (KLONOPIN) 0.5 MG tablet Take 1 tablet (0.5 mg total) by mouth 2 (two) times daily as needed (Anxiety/agitation). 01/24/20 02/23/20 Yes [place] Eric J, DO  gabapentin (NEURONTIN) 100 MG capsule Take 100 mg by mouth at bedtime. Left arm pain for 28 days (ends 9.29.2021)   Yes [provider]  HYDROcodone-acetaminophen (NORCO/VICODIN) 5-325 MG tablet Take 1 tablet by mouth every 6 (six) hours as needed for moderate pain.   Yes [provider]  insulin detemir (LEVEMIR) 100 UNIT/ML injection Inject 0.11 mLs (11 Units total) into the skin 2 (two) times daily. 01/24/20 02/23/20 Yes [place] Eric J, DO  metoprolol tartrate (LOPRESSOR) 25 MG tablet Take 1 tablet (25 mg total) by mouth  2 (two) times daily. 01/24/20 02/23/20 Yes [place] Donnamarie Poag, DO  Multiple Vitamin (MULTIVITAMIN WITH MINERALS) TABS tablet Take 1 tablet by mouth daily.   Yes [provider]  polyethylene glycol (MIRALAX / GLYCOLAX) 17 g packet Take 17 g by mouth daily as needed for mild constipation.   Yes [provider]  QUEtiapine (SEROQUEL) 25  MG tablet Take 0.5 tablets (12.5 mg total) by mouth at bedtime. 01/24/20 02/23/20 Yes [place] Eric J, DO  rifampin (RIFADIN) 300 MG capsule Take 1 capsule (300 mg total) by mouth every 12 (twelve) hours. [birth year] 02/08/10 Yes Delora Fuel, MD  sevelamer carbonate (RENVELA) 800 MG tablet Take 2 tablets (1,600 mg total) by mouth 3 (three) times daily with meals. 01/24/20 02/23/20 Yes [place] Donnamarie Poag, DO  Darbepoetin Alfa (ARANESP) 100 MCG/0.5ML SOSY injection Inject 0.5 mLs (100 mcg total) into the vein every Monday with hemodialysis. Patient not taking: Reported on 02/21/2020 01/24/20   [place] Eric J, DO  multivitamin (RENA-VIT) TABS tablet Take 1 tablet by mouth at bedtime. Patient not taking: Reported on 02/21/2020 01/24/20 02/23/20  [place] Eric J, DO     Total critical care time: 40 minutes  Performed by: [place] care time was exclusive of separately billable procedures and treating other patients.   Critical care was necessary to treat or prevent imminent or life-threatening deterioration.   Critical care was time spent personally by me on the following activities: development of treatment plan with patient and/or surrogate as well as nursing, discussions with consultants, evaluation of patient's response to treatment, examination of patient, obtaining history from patient or surrogate, ordering and performing treatments and interventions, ordering and review of laboratory studies, ordering and review of radiographic studies, pulse oximetry and re-evaluation of patient's condition.   Jacky Kindle MD Critical care physician [facility]  Critical Care  Pager: [phone] Mobile: [phone]

## 2020-02-24 NOTE — Progress Notes (Signed)
PT Cancellation Note  Patient Details Name: [name]. MRN: [id number] DOB: 06-26-1950   Cancelled Treatment:    Reason Eval/Treat Not Completed: Medical issues which prohibited therapy.  RN asked to hold today.  Will see as able 9/24. 02/24/2020  Ginger Carne., PT Acute Rehabilitation Services [phone]  (pager) [phone]  (office)   Tessie Fass Akiko Schexnider 02/24/2020, 5:47 PM

## 2020-02-24 NOTE — Progress Notes (Signed)
Initial Nutrition Assessment  DOCUMENTATION CODES:   Not applicable  INTERVENTION:   Recommend Cortrak placement when able  Tube Feeding via OG: Vital 1.5 at 50 ml/hr Begin TF at 20 ml/hr; titrate by 10 mL q 12 hours until goal rate of 50 ml/hr Pro-Source 90 mL TID Provides 147 g of protein, 2040 kcals and 912 mL of free water  TF regimen and propofol at current rate providing 2307 total kcal/day   Continue Rena-Vite  NUTRITION DIAGNOSIS:   Inadequate oral intake related to acute illness as evidenced by NPO status.  GOAL:   Patient will meet greater than or equal to 90% of their needs  MONITOR:   Vent status, TF tolerance, Labs, Weight trends, Skin  REASON FOR ASSESSMENT:   Ventilator    ASSESSMENT:   69 yo male admitted with acute metabolic encephalopathy likely in setting of COVID-19 infection, developed respiratory failure and septic shock requiring transfer to ICU and intubation. PMH includes ESRD on HD, liver cirrhosis, colon cancer s/p hemicolectomy, polysubstance abuse  9/18 COVID+ dx 9/19 Admission 9/22 Agonal breathing post iHD, hypoglycemic, hypotensive, lethargic; transferred to ICU,  Intubated  Pt sedated on vent support; requiring levophed, off vasopressin  Propofol: 10.1 ml/hr  EDW 87 kg; net +3.5 L per I/O flow sheet. Noted low weight of 81 kg; current wt 86 kg. Possible true weight loss if chart is correct.   Initially hypoglycemic, after treatment became very hyperglycemic. CBGS >600 over night, started on insulin drip  Labs: sodium 128 (L), Creatinine 7.22, BUN 31, phosphorus 6.9 (H), elevated LFTs Meds: calcitriol, aranesp, insulin drip, rena-vite, zinc sulfate, renvela with meals   Diet Order:   Diet Order            Diet NPO time specified  Diet effective now                 EDUCATION NEEDS:   Not appropriate for education at this time  Skin:  Skin Assessment: Reviewed RN Assessment  Last BM:  9/23  Height:   Ht  Readings from Last 1 Encounters:  02/21/20 6' (1.829 m)    Weight:   Wt Readings from Last 1 Encounters:  02/24/20 86.2 kg    BMI:  Body mass index is 25.77 kg/m.  Estimated Nutritional Needs:   Kcal:  2050-2260 kcals  Protein:  130-150 g  Fluid:  1000 mL plus UOP   Kerman Passey MS, RDN, LDN, CNSC Registered Dietitian III Clinical Nutrition RD Pager and On-Call Pager Number Located in [place]

## 2020-02-24 NOTE — Consult Note (Signed)
Consultation Note Date: 02/24/2020   Patient Name: [name].  DOB: Dec 17, 1950  MRN: [id number]  Age / Sex: 69 y.o., male  PCP: [facility] Referring Physician: Jacky Kindle, MD  Reason for Consultation: Establishing goals of care and Psychosocial/spiritual support  HPI/Patient Profile: 69 y.o. male  with past medical history of HTN/HLD, ESRD on HD, DM 2, anemia of chronic disease, colon cancer SP resection, epidural abscess SP laminectomy with fusion 01/2020, MRSA bacteremia admitted on 02/20/2020 with Covid infection, acute metabolic encephalopathy likely in setting of sepsis due to COVID-19 infection, epidural abscess status post laminectomy and fusion August 2021, went to short-term rehab.  Ventilated 9/22 at around 1300.  Clinical Assessment and Goals of Care: I have reviewed medical records including EPIC notes, labs and imaging, received report from CCM attending, examined the patient.  Mr. Galdamez is viewed through the glass door of the intensive care.  He is Covid positive.  He is intubated/ventilated, appears acutely/chronically ill and somewhat frail.  There is no family present at this time due to visitor restrictions.   Call to daughter, Dyon Rotert to discuss diagnosis prognosis, [place], EOL wishes, disposition and options.  I introduced Palliative Medicine as specialized medical care for people living with serious illness. It focuses on providing relief from the symptoms and stress of a serious illness.  We talked about Mr. Sites acute health concerns.  Joseph Art is able to accurately name his health conditions including but not limited to blood infection, liver failure, kidney failure, and "battling Covid".  Joseph Art tells me that she only recently learned that Mr. Mapps was not completing his full HD treatments.  We discussed a brief life review of the patient.  Mr. Maddy has  been living independently until his next surgery in August 2021.  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  As far as functional and nutritional status, again, he was independent with IADLs/ADLs.  He has been on hemodialysis since January 2021.  We discussed current illness and what it means in the larger context of on-going co-morbidities.  Natural disease trajectory and expectations at EOL were discussed.  We talked about the treatment plan in detail including, but not limited to, images, lab draws, medications.  We talked about time for outcomes.  We talked about healthcare power of attorney, see below. We talked about CODE STATUS, see below.  At this point, Joseph Art states that he would like time for outcomes, full scope full code.  As she is mentioned her face, we talked about his body, modern medicine, and God's will.  Renee states agreement.  I share that over the next few days his body will tell us if he is able to have a meaningful recovery.  Renee agrees.  Questions and concerns were addressed.  The family was encouraged to call with questions or concerns.   Conference with attending, nephrology NP, bedside nursing staff related to patient condition, needs, goals  of care. PMT to follow-up tomorrow.    HCPOA    NEXT OF KIN -Mr. Boudoin has not named healthcare surrogate.  Daughter Harpreet Pompey is the main contact at this point. His mother is age 8, but has no memory deficits.  He has 3 daughters, Joseph Art, and twins [name] and [place].  He has a sister and brother living.    SUMMARY OF RECOMMENDATIONS   Continue full scope/full code Time for outcomes Continue CODE STATUS discussions.   Code Status/Advance Care Planning:  Full code -Joseph Art tells me that she, her 2 sisters, and Mr. Westman mother spoke about CODE STATUS.  She shares that they were divided.  To wanted a peaceful death, too wanted to continue.   Joseph Art shares that her mother stated that if Mr. Foss has to suffer a broken bone in order to be alive they would accept that.  Renee asks about CODE STATUS at the HD center.  She states that this would take the burden from family's decision.  NP Owens Shark is present and she checks HD center records that show Mr. Denardo is indeed full code at the HD center.  I shared that over the next few days Mr. Bocock body will tell us if he is able to continue to live.  Joseph Art talks about their spiritual faith, and that they are praying for healing.  Symptom Management:   Per CCM, no additional needs at this time.  Palliative Prophylaxis:   Oral Care and Turn Reposition  Additional Recommendations (Limitations, Scope, Preferences):  Full Scope Treatment  Psycho-social/Spiritual:   Desire for further Chaplaincy support:no  Additional Recommendations: Caregiving  Support/Resources and Grief/Bereavement Support  Prognosis:   Unable to determine, based on outcomes.  Critical/guarded at this point.  Days would not be surprising.  Discharge Planning: To be determined, based on outcomes.      Primary Diagnoses: Present on Admission: . HYPERTENSION . Hyperlipidemia . Type 2 diabetes mellitus with renal complication (HCC) . Epidural abscess . ESRD (end stage renal disease) ([place]) . COVID-19 virus infection . Hyperkalemia . Acute metabolic encephalopathy . Anemia of chronic disease   I have reviewed the medical record, interviewed the patient and family, and examined the patient. The following aspects are pertinent.  Past Medical History:  Diagnosis Date  . Anemia   . Arthritis    left knee  . Chronic kidney disease    acute renal failure/injury requiring short-term HD 2013  . Colon cancer ([place])   . Colon polyp    11/2011 - Polyps identified, biopsy - invasive adenocarinoma  . DM II (diabetes mellitus, type II), controlled ([place])    type 2 IDDM x 15 years. A1C 1/09 13.7.   Marland Kitchen  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 12/25/2011  . GERD (gastroesophageal reflux disease)   . Heart murmur   . 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 Hyperlipidemia   . Hypertension    for a few years and resolved in 2013/2014.  . Sleep apnea    does not wear CPAP now  . Wears glasses    Social History   Socioeconomic History  . Marital status: Divorced    Spouse name: Not on file  . Number of children: 3  . Years of education: Not on file  . Highest education level: Not on file  Occupational History  . Occupation: retired/disabled    Employer: [employer]    Comment: Worked at [employer]  . Smoking status: Current Every Day  Smoker    Packs/day: 0.50    Years: 30.00    Pack years: 15.00    Types: Cigarettes  . Smokeless tobacco: Never Used  Substance and Sexual Activity  . Alcohol use: Yes    Comment: Liquor twice monthly.  . 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 Sexual activity: Not on file  Other Topics Concern  . Not on file  Social History Narrative   Financial assistance approved for 100% discount at [facility] and has [facility] card   [employer]  August 08, 2009 5:48 PM   *PATIENT WAS GIVEN DM CARD.Lela Sturdivant NT II  June 08, 2010 3:56 PM      Divorced, one daughter currently staying with him   Has 7 grand children   Reports disability and medicaid is pending   Social Determinants of Health   Financial Resource Strain:   . Difficulty of Paying Living Expenses: Not on file  Food Insecurity:   . Worried About Charity fundraiser in the Last Year: Not on file  . Ran Out of Food in the Last Year: Not on file  Transportation Needs:   . Lack of Transportation (Medical): Not on file  . Lack of Transportation (Non-Medical): Not on file  Physical Activity:   . Days of Exercise per Week: Not on file  . Minutes of Exercise per Session: Not on file  Stress:   . Feeling of Stress : Not on file  Social  Connections:   . Frequency of Communication with Friends and Family: Not on file  . Frequency of Social Gatherings with Friends and Family: Not on file  . Attends Religious Services: Not on file  . Active Member of Clubs or Organizations: Not on file  . Attends Archivist Meetings: Not on file  . Marital Status: Not on file   Family History  Problem Relation Age of Onset  . Diabetes Father   . Heart disease Father   . Hypertension Mother   . Diabetes Sister   . Heart disease Sister   . Colon cancer Neg Hx    Scheduled Meds: . [START ON 02/25/2020] calcitRIOL  1.5 mcg Per Tube Q M,W,F-HD  . chlorhexidine gluconate (MEDLINE KIT)  15 mL Mouth Rinse BID  . Chlorhexidine Gluconate Cloth  6 each Topical Q0600  . darbepoetin (ARANESP) injection - DIALYSIS  100 mcg Intravenous Q Wed-HD  . heparin  5,000 Units Subcutaneous Q8H  . hydrocortisone sod succinate (SOLU-CORTEF) inj  100 mg Intravenous Once  . hydrocortisone sod succinate (SOLU-CORTEF) inj  50 mg Intravenous Q6H  . insulin aspart  0-20 Units Subcutaneous TID WC  . mouth rinse  15 mL Mouth Rinse 10 times per day  . multivitamin  1 tablet Per Tube QHS  . pantoprazole sodium  40 mg Per Tube Daily  . sevelamer carbonate  1,600 mg Per Tube TID WC  . [START ON 02/25/2020] zinc sulfate  220 mg Per Tube Daily   Continuous Infusions: . sodium chloride    . sodium chloride    . sodium chloride Stopped (02/21/20 2227)  . dexmedetomidine (PRECEDEX) IV infusion 0.8 mcg/kg/hr (02/24/20 1253)  . insulin 14 mL/hr at 02/24/20 1200  . norepinephrine (LEVOPHED) Adult infusion 4 mcg/min (02/24/20 1200)  . propofol (DIPRIVAN) infusion Stopped (02/24/20 0849)  . vancomycin Stopped (02/23/20 1641)  . vasopressin Stopped (02/24/20 0859)   PRN Meds:.sodium chloride, sodium  chloride, sodium chloride, alteplase, chlorpheniramine-HYDROcodone, fentaNYL (SUBLIMAZE) injection, guaiFENesin-dextromethorphan, heparin, lidocaine (PF),  lidocaine-prilocaine, ondansetron **OR** ondansetron (ZOFRAN) IV, pentafluoroprop-tetrafluoroeth Medications Prior to Admission:  Prior to Admission medications   Medication Sig Start Date End Date Taking? Authorizing Provider  amLODipine (NORVASC) 10 MG tablet Take 1 tablet (10 mg total) by mouth at bedtime. 01/24/20 02/23/20 Yes [place] Eric J, DO  atorvastatin (LIPITOR) 20 MG tablet Take 1 tablet (20 mg total) by mouth at bedtime. 01/24/20 02/23/20 Yes [place] Eric J, DO  clonazePAM (KLONOPIN) 0.5 MG tablet Take 1 tablet (0.5 mg total) by mouth 2 (two) times daily as needed (Anxiety/agitation). 01/24/20 02/23/20 Yes [place] Eric J, DO  gabapentin (NEURONTIN) 100 MG capsule Take 100 mg by mouth at bedtime. Left arm pain for 28 days (ends 9.29.2021)   Yes [provider]  HYDROcodone-acetaminophen (NORCO/VICODIN) 5-325 MG tablet Take 1 tablet by mouth every 6 (six) hours as needed for moderate pain.   Yes [provider]  insulin detemir (LEVEMIR) 100 UNIT/ML injection Inject 0.11 mLs (11 Units total) into the skin 2 (two) times daily. 01/24/20 02/23/20 Yes [place] Eric J, DO  metoprolol tartrate (LOPRESSOR) 25 MG tablet Take 1 tablet (25 mg total) by mouth 2 (two) times daily. 01/24/20 02/23/20 Yes [place] Donnamarie Poag, DO  Multiple Vitamin (MULTIVITAMIN WITH MINERALS) TABS tablet Take 1 tablet by mouth daily.   Yes [provider]  polyethylene glycol (MIRALAX / GLYCOLAX) 17 g packet Take 17 g by mouth daily as needed for mild constipation.   Yes [provider]  QUEtiapine (SEROQUEL) 25 MG tablet Take 0.5 tablets (12.5 mg total) by mouth at bedtime. 01/24/20 02/23/20 Yes [place] Eric J, DO  rifampin (RIFADIN) 300 MG capsule Take 1 capsule (300 mg total) by mouth every 12 (twelve) hours. 8/52/77 82/4/23 Yes Delora Fuel, MD  Darbepoetin Alfa (ARANESP) 100 MCG/0.5ML SOSY injection Inject 0.5 mLs (100 mcg total) into the vein every Monday with hemodialysis. Patient not  taking: Reported on 02/21/2020 01/24/20   [place] Eric J, DO   No Known Allergies Review of Systems  Unable to perform ROS: Intubated    Physical Exam Vitals and nursing note reviewed.  Cardiovascular:     Rate and Rhythm: Normal rate.  Pulmonary:     Comments: Intubated/ventilated    Vital Signs: BP (!) 146/53   Pulse (!) 107   Temp 97.8 F (36.6 C) (Axillary)   Resp 18   Ht 6' (1.829 m)   Wt 86.2 kg   SpO2 (!) 88%   BMI 25.77 kg/m  Pain Scale: CPOT POSS *See Group Information*: 1-Acceptable,Awake and alert Pain Score: 0-No pain   SpO2: SpO2: (!) 88 % O2 Device:SpO2: (!) 88 % O2 Flow Rate: .O2 Flow Rate (L/min): 2 L/min  IO: Intake/output summary:   Intake/Output Summary (Last 24 hours) at 02/24/2020 1303 Last data filed at 02/24/2020 1200 Gross per 24 hour  Intake 1581.98 ml  Output 1425 ml  Net 156.98 ml    LBM: Last BM Date: 02/23/20 Baseline Weight: Weight: 86.2 kg Most recent weight: Weight: 86.2 kg     Palliative Assessment/Data:   Flowsheet Rows     Most Recent Value  Intake Tab  Referral Department Hospitalist  Unit at Time of Referral ICU  Palliative Care Primary Diagnosis Sepsis/Infectious Disease  Date Notified 02/23/20  Palliative Care Type New Palliative care  Reason for referral Clarify Goals of Care  Date of Admission 02/20/20  Date first seen by Palliative Care 02/24/20  #  of days Palliative referral response time 1 Day(s)  # of days IP prior to Palliative referral 3  Clinical Assessment  Palliative Performance Scale Score 10%  Pain Max last 24 hours Not able to report  Pain Min Last 24 hours Not able to report  Dyspnea Max Last 24 Hours Not able to report  Dyspnea Min Last 24 hours Not able to report  Psychosocial & Spiritual Assessment  Palliative Care Outcomes      Time In: 1400  Time Out: 1510 Time Total: 70 minutes  Greater than 50%  of this time was spent counseling and coordinating care related to the above assessment  and plan.  Signed by: Drue Novel, NP   Please contact Palliative Medicine Team phone at [phone] for questions and concerns.  For individual provider: See Shea Evans

## 2020-02-24 NOTE — Plan of Care (Signed)
  Problem: Clinical Measurements: Goal: Diagnostic test results will improve Outcome: Progressing   Problem: Nutrition: Goal: Adequate nutrition will be maintained Outcome: Progressing   Problem: Pain Managment: Goal: General experience of comfort will improve Outcome: Progressing   Problem: Respiratory: Goal: Will maintain a patent airway Outcome: Progressing   Problem: Education: Goal: Knowledge of General Education information will improve Description: Including pain rating scale, medication(s)/side effects and non-pharmacologic comfort measures Outcome: Not Progressing

## 2020-02-24 NOTE — Progress Notes (Signed)
[facility] Progress Note  Subjective:  Patient seen in ICU, on vent , sedated not responding.   Vitals:   02/24/20 0800 02/24/20 0815 02/24/20 0830 02/24/20 1200  BP: 140/72   (!) 146/53  Pulse: 66 66  (!) 107  Resp: (!) 24 (!) 22 (!) 21 18  Temp: 97.8 F (36.6 C)     TempSrc: Axillary     SpO2: 100% 100%  (!) 88%  Weight:      Height:        Exam:  on vent, sedated   no jvd  Chest cta bilat ant/ lat  Cor reg no RG  Abd soft ntnd no ascites obese   Ext no LE edema   Sedated on then vent    R IJ TDC/  L arm AVF+bruit     CXR 9/22 - patchy perihilar dz, no edema    OP HD: [facility] MWF   4h 31mn  87kg  2/2 bath  TDC / L AVF  Hep none  -Mircera 150 mcg IV q 2 weeks (last dose 02/09/2020) -Venofer 100 mg IV X 8 doses (not started yet) -Calcitriol 1.5 mcg PO TIW  Assessment/ Plan: 1. COVID 19-recent diagnosis. [facility]  Remdesivir per protocol. Fevers down but WBC now rising, maybe d/t steroids.  2. VDRF - per CCM 3. AMS - multifactorial  4. Recent epidural abscess/MRSA cervical discitis-  Sp I&D per neurosurgery. Continues on Vancomycin and Rifampin here through 10/01.  5.  ESRD - MWF HD. HD Friday in ICU.  6.  HD access - L BC AVF placed Feb 2021. Was used starting in May and had infiltration in Aug [facility]  7.  Hypertension/volume - 1-2 kg under, lowest here 82kg, 86 today, BP's are okay, no pressors. UF as tol w/ HD Friday.  8.  Anemia  - HGB 8.5. ESA due this week. Ordered darbe 100 q wed to start tomorrow. Hold OP Fe load.  9.  Metabolic bone disease -Continue binders, VDRA 10.  Nutrition -NPO at present.  10.  DM-per primary   RKelly Splinter9/23/2021, 1:11 PM   Recent Labs  Lab 02/23/20 0404 02/23/20 0404 02/23/20 1531 02/23/20 1548 02/24/20 0330 02/24/20 0859  K 4.1   < > 3.6   < > 5.1 4.6  BUN 70*   < > 22  --  31*  --   CREATININE 13.07*   < > 5.95*  --   7.22*  --   CALCIUM 8.1*   < > 8.0*  --  8.2*  --   PHOS 9.5*  --   --   --  6.9*  --   HGB 8.6*   < > 7.8*   < > 8.7* 9.2*   < > = values in this interval not displayed.   Inpatient medications: . [START ON 02/25/2020] calcitRIOL  1.5 mcg Per Tube Q M,W,F-HD  . chlorhexidine gluconate (MEDLINE KIT)  15 mL Mouth Rinse BID  . Chlorhexidine Gluconate Cloth  6 each Topical Q0600  . darbepoetin (ARANESP) injection - DIALYSIS  100 mcg Intravenous Q Wed-HD  . heparin  5,000 Units Subcutaneous Q8H  . hydrocortisone sod succinate (SOLU-CORTEF) inj  100 mg Intravenous Once  . hydrocortisone sod succinate (SOLU-CORTEF) inj  50 mg Intravenous Q6H  . insulin aspart  0-20 Units Subcutaneous TID WC  . mouth rinse  15 mL Mouth Rinse 10  times per day  . multivitamin  1 tablet Per Tube QHS  . pantoprazole sodium  40 mg Per Tube Daily  . sevelamer carbonate  1,600 mg Per Tube TID WC  . [START ON 02/25/2020] zinc sulfate  220 mg Per Tube Daily   . sodium chloride    . sodium chloride    . sodium chloride Stopped (02/21/20 2227)  . dexmedetomidine (PRECEDEX) IV infusion 0.8 mcg/kg/hr (02/24/20 1253)  . insulin 14 mL/hr at 02/24/20 1200  . norepinephrine (LEVOPHED) Adult infusion 4 mcg/min (02/24/20 1200)  . propofol (DIPRIVAN) infusion Stopped (02/24/20 0849)  . vancomycin Stopped (02/23/20 1641)  . vasopressin Stopped (02/24/20 0859)   sodium chloride, sodium chloride, sodium chloride, alteplase, chlorpheniramine-HYDROcodone, fentaNYL (SUBLIMAZE) injection, guaiFENesin-dextromethorphan, heparin, lidocaine (PF), lidocaine-prilocaine, ondansetron **OR** ondansetron (ZOFRAN) IV, pentafluoroprop-tetrafluoroeth

## 2020-02-24 NOTE — Progress Notes (Signed)
OT Cancellation Note  Patient Details Name: [name]. MRN: [id number] DOB: 11/22/1950   Cancelled Treatment:    Reason Eval/Treat Not Completed: Medical issues which prohibited therapy (Pt newly intubated and sedated; RN requesting hold. Will continue to follow for appropriateness)  Zenovia Jarred, MSOT, OTR/L [facility] Office Number: [phone] Pager: [phone]  Zenovia Jarred 02/24/2020, 3:20 PM

## 2020-02-25 LAB — CBC
HCT: 25.6 % — ABNORMAL LOW (ref 39.0–52.0)
Hemoglobin: 8.5 g/dL — ABNORMAL LOW (ref 13.0–17.0)
MCH: 25.6 pg — ABNORMAL LOW (ref 26.0–34.0)
MCHC: 33.2 g/dL (ref 30.0–36.0)
MCV: 77.1 fL — ABNORMAL LOW (ref 80.0–100.0)
Platelets: 212 10*3/uL (ref 150–400)
RBC: 3.32 MIL/uL — ABNORMAL LOW (ref 4.22–5.81)
RDW: 18.3 % — ABNORMAL HIGH (ref 11.5–15.5)
WBC: 10.4 10*3/uL (ref 4.0–10.5)
nRBC: 3.1 % — ABNORMAL HIGH (ref 0.0–0.2)

## 2020-02-25 LAB — MAGNESIUM
Magnesium: 1.8 mg/dL (ref 1.7–2.4)
Magnesium: 2.7 mg/dL — ABNORMAL HIGH (ref 1.7–2.4)

## 2020-02-25 LAB — GLUCOSE, CAPILLARY
Glucose-Capillary: 149 mg/dL — ABNORMAL HIGH (ref 70–99)
Glucose-Capillary: 167 mg/dL — ABNORMAL HIGH (ref 70–99)
Glucose-Capillary: 151 mg/dL — ABNORMAL HIGH (ref 70–99)
Glucose-Capillary: 145 mg/dL — ABNORMAL HIGH (ref 70–99)
Glucose-Capillary: 145 mg/dL — ABNORMAL HIGH (ref 70–99)
Glucose-Capillary: 149 mg/dL — ABNORMAL HIGH (ref 70–99)
Glucose-Capillary: 147 mg/dL — ABNORMAL HIGH (ref 70–99)
Glucose-Capillary: 139 mg/dL — ABNORMAL HIGH (ref 70–99)
Glucose-Capillary: 114 mg/dL — ABNORMAL HIGH (ref 70–99)
Glucose-Capillary: 87 mg/dL (ref 70–99)
Glucose-Capillary: 103 mg/dL — ABNORMAL HIGH (ref 70–99)
Glucose-Capillary: 119 mg/dL — ABNORMAL HIGH (ref 70–99)
Glucose-Capillary: 167 mg/dL — ABNORMAL HIGH (ref 70–99)
Glucose-Capillary: 160 mg/dL — ABNORMAL HIGH (ref 70–99)
Glucose-Capillary: 159 mg/dL — ABNORMAL HIGH (ref 70–99)
Glucose-Capillary: 124 mg/dL — ABNORMAL HIGH (ref 70–99)
Glucose-Capillary: 138 mg/dL — ABNORMAL HIGH (ref 70–99)
Glucose-Capillary: 168 mg/dL — ABNORMAL HIGH (ref 70–99)
Glucose-Capillary: 60 mg/dL — ABNORMAL LOW (ref 70–99)

## 2020-02-25 LAB — PROTIME-INR
INR: 2 — ABNORMAL HIGH (ref 0.8–1.2)
Prothrombin Time: 22.3 seconds — ABNORMAL HIGH (ref 11.4–15.2)

## 2020-02-25 LAB — COMPREHENSIVE METABOLIC PANEL
ALT: 1050 U/L — ABNORMAL HIGH (ref 0–44)
AST: 1974 U/L — ABNORMAL HIGH (ref 15–41)
Albumin: 2.3 g/dL — ABNORMAL LOW (ref 3.5–5.0)
Alkaline Phosphatase: 155 U/L — ABNORMAL HIGH (ref 38–126)
Anion gap: 17 — ABNORMAL HIGH (ref 5–15)
BUN: 52 mg/dL — ABNORMAL HIGH (ref 8–23)
CO2: 21 mmol/L — ABNORMAL LOW (ref 22–32)
Calcium: 8.1 mg/dL — ABNORMAL LOW (ref 8.9–10.3)
Chloride: 96 mmol/L — ABNORMAL LOW (ref 98–111)
Creatinine, Ser: 8.86 mg/dL — ABNORMAL HIGH (ref 0.61–1.24)
GFR calc Af Amer: 6 mL/min — ABNORMAL LOW (ref >60)
GFR calc non Af Amer: 6 mL/min — ABNORMAL LOW (ref >60)
Glucose, Bld: 157 mg/dL — ABNORMAL HIGH (ref 70–99)
Potassium: 4.1 mmol/L (ref 3.5–5.1)
Sodium: 134 mmol/L — ABNORMAL LOW (ref 135–145)
Total Bilirubin: 1.6 mg/dL — ABNORMAL HIGH (ref 0.3–1.2)
Total Protein: 6.5 g/dL (ref 6.5–8.1)

## 2020-02-25 LAB — POCT I-STAT 7, (LYTES, BLD GAS, ICA,H+H)
Acid-base deficit: 2 mmol/L (ref 0.0–2.0)
Bicarbonate: 22.3 mmol/L (ref 20.0–28.0)
Calcium, Ion: 1.09 mmol/L — ABNORMAL LOW (ref 1.15–1.40)
HCT: 27 % — ABNORMAL LOW (ref 39.0–52.0)
Hemoglobin: 9.2 g/dL — ABNORMAL LOW (ref 13.0–17.0)
O2 Saturation: 88 %
Potassium: 4 mmol/L (ref 3.5–5.1)
Sodium: 136 mmol/L (ref 135–145)
TCO2: 23 mmol/L (ref 22–32)
pCO2 arterial: 33.7 mmHg (ref 32.0–48.0)
pH, Arterial: 7.429 (ref 7.350–7.450)
pO2, Arterial: 52 mmHg — ABNORMAL LOW (ref 83.0–108.0)

## 2020-02-25 LAB — VANCOMYCIN, RANDOM: Vancomycin Rm: 25

## 2020-02-25 LAB — PHOSPHORUS
Phosphorus: 4.8 mg/dL — ABNORMAL HIGH (ref 2.5–4.6)
Phosphorus: 1.8 mg/dL — ABNORMAL LOW (ref 2.5–4.6)

## 2020-02-25 MED ORDER — VANCOMYCIN HCL IN DEXTROSE 1-5 GM/200ML-% IV SOLN
INTRAVENOUS | Status: AC
  Filled 2020-02-25: qty 200

## 2020-02-25 MED ORDER — SODIUM CHLORIDE 0.9 % IV SOLN: 100.0000 mg/kg | Freq: Three times a day (TID) | INTRAVENOUS | Status: DC

## 2020-02-25 MED ORDER — PIPERACILLIN-TAZOBACTAM IN DEX 2-0.25 GM/50ML IV SOLN
2.2500 g | Freq: Three times a day (TID) | INTRAVENOUS | Status: DC
  Administered 2020-02-25 – 2020-02-27 (×7): 2.25 g via INTRAVENOUS
  Filled 2020-02-25 (×8): qty 50

## 2020-02-25 MED ORDER — INSULIN ASPART 100 UNIT/ML ~~LOC~~ SOLN
0.0000 [IU] | Freq: Three times a day (TID) | SUBCUTANEOUS | Status: DC
  Administered 2020-02-25: 3 [IU] via SUBCUTANEOUS

## 2020-02-25 MED ORDER — MAGNESIUM SULFATE 4 GM/100ML IV SOLN
4.0000 g | Freq: Once | INTRAVENOUS | Status: AC
  Administered 2020-02-25: 4 g via INTRAVENOUS
  Filled 2020-02-25: qty 100

## 2020-02-25 MED ORDER — INSULIN ASPART 100 UNIT/ML ~~LOC~~ SOLN
0.0000 [IU] | SUBCUTANEOUS | Status: DC
  Administered 2020-02-25: 4 [IU] via SUBCUTANEOUS
  Administered 2020-02-26: 7 [IU] via SUBCUTANEOUS
  Administered 2020-02-26: 11 [IU] via SUBCUTANEOUS
  Administered 2020-02-26: 7 [IU] via SUBCUTANEOUS
  Administered 2020-02-26: 11 [IU] via SUBCUTANEOUS

## 2020-02-25 MED ORDER — HEPARIN SODIUM (PORCINE) 1000 UNIT/ML IJ SOLN
INTRAMUSCULAR | Status: AC
  Administered 2020-02-25: 1000 [IU]
  Filled 2020-02-25: qty 4

## 2020-02-25 MED ORDER — INSULIN GLARGINE 100 UNIT/ML ~~LOC~~ SOLN
15.0000 [IU] | Freq: Every day | SUBCUTANEOUS | Status: DC
  Administered 2020-02-25 – 2020-02-26 (×2): 15 [IU] via SUBCUTANEOUS
  Filled 2020-02-25 (×2): qty 0.15

## 2020-02-25 MED ORDER — LACTATED RINGERS IV BOLUS
1000.0000 mL | Freq: Once | INTRAVENOUS | Status: AC
  Administered 2020-02-25: 1000 mL via INTRAVENOUS

## 2020-02-25 MED ORDER — MIDODRINE HCL 5 MG PO TABS
20.0000 mg | ORAL_TABLET | Freq: Three times a day (TID) | ORAL | Status: DC
  Administered 2020-02-25 – 2020-02-28 (×7): 20 mg
  Filled 2020-02-25 (×7): qty 4

## 2020-02-25 MED ORDER — INSULIN ASPART 100 UNIT/ML ~~LOC~~ SOLN
5.0000 [IU] | SUBCUTANEOUS | Status: DC
  Administered 2020-02-25 – 2020-02-26 (×7): 5 [IU] via SUBCUTANEOUS

## 2020-02-25 NOTE — Progress Notes (Signed)
OT Cancellation Note  Patient Details Name: [name]. MRN: [id number] DOB: 12/18/1950   Cancelled Treatment:    Reason Eval/Treat Not Completed: Medical issues which prohibited therapy (Pt remains intubated/sedated. Will continue to hold to see if signing off is necessary or if pt is stable to continue with OT POC)  Zenovia Jarred, MSOT, OTR/L [facility] Office Number: [phone] Pager: [phone]  Zenovia Jarred 02/25/2020, 1:13 PM

## 2020-02-25 NOTE — Plan of Care (Signed)
  Problem: Education: Goal: Knowledge of General Education information will improve Description: Including pain rating scale, medication(s)/side effects and non-pharmacologic comfort measures Outcome: Progressing   Problem: Health Behavior/Discharge Planning: Goal: Ability to manage health-related needs will improve Outcome: Progressing   Problem: Clinical Measurements: Goal: Ability to maintain clinical measurements within normal limits will improve Outcome: Progressing Goal: Will remain free from infection Outcome: Progressing Goal: Diagnostic test results will improve Outcome: Progressing Goal: Respiratory complications will improve Outcome: Progressing Goal: Cardiovascular complication will be avoided Outcome: Progressing   Problem: Activity: Goal: Risk for activity intolerance will decrease Outcome: Progressing   Problem: Nutrition: Goal: Adequate nutrition will be maintained Outcome: Progressing   Problem: Coping: Goal: Level of anxiety will decrease Outcome: Progressing   Problem: Elimination: Goal: Will not experience complications related to bowel motility Outcome: Progressing Goal: Will not experience complications related to urinary retention Outcome: Progressing   Problem: Pain Managment: Goal: General experience of comfort will improve Outcome: Progressing   Problem: Safety: Goal: Ability to remain free from injury will improve Outcome: Progressing   Problem: Skin Integrity: Goal: Risk for impaired skin integrity will decrease Outcome: Progressing   Problem: Education: Goal: Knowledge of risk factors and measures for prevention of condition will improve Outcome: Progressing   Problem: Coping: Goal: Psychosocial and spiritual needs will be supported Outcome: Progressing   Problem: Respiratory: Goal: Will maintain a patent airway Outcome: Progressing Goal: Complications related to the disease process, condition or treatment will be avoided or  minimized Outcome: Progressing   Problem: Education: Goal: Knowledge of disease and its progression will improve Outcome: Progressing Goal: Individualized Educational Video(s) Outcome: Progressing   Problem: Fluid Volume: Goal: Compliance with measures to maintain balanced fluid volume will improve Outcome: Progressing   Problem: Health Behavior/Discharge Planning: Goal: Ability to manage health-related needs will improve Outcome: Progressing   Problem: Nutritional: Goal: Ability to make healthy dietary choices will improve Outcome: Progressing   Problem: Clinical Measurements: Goal: Complications related to the disease process, condition or treatment will be avoided or minimized Outcome: Progressing

## 2020-02-25 NOTE — Progress Notes (Signed)
Physical Therapy Treatment Patient Details Name: [name]. MRN: [id number] DOB: 10-05-1950 Today's Date: 02/25/2020    History of Present Illness Pt is a 69 y.o. male with medical history significant of hypertension, hyperlipidemia ESRD on hemodialysis on Monday Wednesday, Friday, type 2 diabetes mellitus, anemia of chronic disease, colon cancer status post resection, epidural abscess status post laminectomy, fusion on 01/06/2020 and MRSA bacteremia presents to emergency department for evaluation of confusion and COVID-19 positive.  He is admitted with metabolic encephalopathy and COVID 19.    PT Comments    Pt with little acknowledgement of caregivers except to resist to discomfort of cleanup and assist.  Emphasis other than assisting with peri-care was to assess and fully range all 4's and trunk often working with or dealing with his rigidity and/or resistance to movement.    Follow Up Recommendations  SNF     Equipment Recommendations  None recommended by PT    Recommendations for Other Services       Precautions / Restrictions Precautions Precautions: Fall Precaution Comments: AMS; BP    Mobility  Bed Mobility Overal bed mobility: Needs Assistance Bed Mobility: Rolling Rolling: Total assist;+2 for physical assistance         General bed mobility comments: Assisted with rolling/holding and positioning for extensive clean up/peri-care from 3 runny stools.  Transfers                 General transfer comment: deferred due to stool  Ambulation/Gait                 Stairs             Wheelchair Mobility    Modified Rankin (Stroke Patients Only)       Balance                                            Cognition Arousal/Alertness: Lethargic Behavior During Therapy: Flat affect Overall Cognitive Status: No family/caregiver present to determine baseline cognitive functioning                                  General Comments: Eyes open a >50%, but pt generally not acknowledging caregivers/therapists unless he is in discomfort.      Exercises Other Exercises Other Exercises: Before pt was cleaned up, pt was ranged in all 4 extremities. Pt accepted and also resisted motion at times.  Pt's extremities and trunk were well moved during extensive peri-care during 3 stools.    General Comments General comments (skin integrity, edema, etc.): pt on vent at 40% fi02,  sats generally in the lower to mid 90's.        Pertinent Vitals/Pain Pain Assessment: Faces Faces Pain Scale: Hurts a little bit Pain Location: general discomfort with ROM Pain Descriptors / Indicators: Grimacing Pain Intervention(s): Monitored during session    Home Living                      Prior Function            PT Goals (current goals can now be found in the care plan section) Acute Rehab PT Goals Patient Stated Goal: Unstated PT Goal Formulation: Patient unable to participate in goal setting Time For Goal Achievement: 03/06/20 Potential to Achieve Goals: Fair Progress  towards PT goals: Not progressing toward goals - comment (pt was not actively participating, but was resistive at time)    Frequency    Min 2X/week      PT Plan Current plan remains appropriate    Co-evaluation              AM-PAC PT "6 Clicks" Mobility   Outcome Measure  Help needed turning from your back to your side while in a flat bed without using bedrails?: Total Help needed moving from lying on your back to sitting on the side of a flat bed without using bedrails?: Total Help needed moving to and from a bed to a chair (including a wheelchair)?: Total Help needed standing up from a chair using your arms (e.g., wheelchair or bedside chair)?: Total Help needed to walk in hospital room?: Total Help needed climbing 3-5 steps with a railing? : Total 6 Click Score: 6    End of Session   Activity  Tolerance: Patient tolerated treatment well;Treatment limited secondary to agitation Patient left: in bed;with call bell/phone within reach;with bed alarm set Nurse Communication: Mobility status PT Visit Diagnosis: Other abnormalities of gait and mobility (R26.89);Muscle weakness (generalized) (M62.81);Other symptoms and signs involving the nervous system (R29.898)     Time: 1600-1640 PT Time Calculation (min) (ACUTE ONLY): 40 min  Charges:  $Therapeutic Activity: 8-22 mins $Self Care/Home Management: 23-37                     02/25/2020  Ginger Carne., PT Acute Rehabilitation Services [phone]  (pager) [phone]  (office)   [name] 02/25/2020, 5:19 PM

## 2020-02-25 NOTE — Progress Notes (Signed)
SLP Cancellation Note  Patient Details Name: [name]. MRN: [id number] DOB: 06-10-1950   Cancelled treatment:       Reason Eval/Treat Not Completed: Medical issues which prohibited therapy. Remains intubated. Will sign off at this time and await new orders   Roshonda Sperl, Katherene Ponto 02/25/2020, 7:54 AM

## 2020-02-25 NOTE — Progress Notes (Signed)
Pharmacy Antibiotic Note  [name]. is a 69 y.o. male admitted on 02/20/2020 with epidural abscess on IV vancomycin PTA for MRSA bacteremia with epidural abscess s/p spinal fusion.  Pharmacy has been consulted to resume vancomycin dosing. WBC wnl. Vancomycin is currently scheduled to be administered MWF with HD. Pre-HD vancomycin level was drawn on 9/24 and is 25 which is at HD vancomycin goal level. Confirmed with HD nurse to give vancomycin after 9/24 HD session.    Plan: - Continue vancomycin 1 gm IV Q. Currently in HD with plans to receive vancomycin dose 9/24.  -Monitor clinical progress and cultures - Continue to monitor HD schedule  Height: 6' (182.9 cm) Weight: 84.1 kg (185 lb 6.5 oz) IBW/kg (Calculated) : 77.6  Temp (24hrs), Avg:97.9 F (36.6 C), Min:97.4 F (36.3 C), Max:98.5 F (36.9 C)  Recent Labs  Lab 02/20/20 1315 02/21/20 0017 02/21/20 0020 02/21/20 0434 02/22/20 0837 02/23/20 0404 02/23/20 1531 02/24/20 0330 02/25/20 0311  WBC 4.7   < >  --    < > 5.1 7.6 15.7* 24.1* 10.4  CREATININE 13.66*   < >  --    < > 10.29* 13.07* 5.95* 7.22* 8.86*  LATICACIDVEN 1.1  --  1.8  --  2.3*  --   --   --   --   VANCORANDOM  --   --   --   --   --   --   --   --  25   < > = values in this interval not displayed.    Estimated Creatinine Clearance: 8.8 mL/min (A) (by C-G formula based on SCr of 8.86 mg/dL (H)).    No Known Allergies  Antimicrobials this admission: Vanc 9/19>> Zosyn 9/24>> Dose adjustments this admission: NA  Microbiology results: 9/21 BCx>>ngtd 9/20 BCx>>ngtd  Thank you for allowing pharmacy to be a part of this patient's care. Cephus Slater, PharmD, MBA Pharmacy Resident [phone] 02/25/2020 11:03 AM

## 2020-02-25 NOTE — Progress Notes (Signed)
[facility] Progress Note  Subjective:  Patient seen in ICU, on vent , sedated not responding.   Vitals:   02/25/20 1100 02/25/20 1115 02/25/20 1130 02/25/20 1208  BP: (!) 183/64 (!) 179/69 126/80   Pulse: 67 68 76   Resp: (!) 22 (!) 29 20   Temp:    (!) 97.4 F (36.3 C)  TempSrc:    Axillary  SpO2: 100% 100% 95%   Weight:      Height:        Exam:  on vent, sedated   no jvd  Chest cta bilat ant/ lat  Cor reg no RG  Abd soft ntnd no ascites obese   Ext no LE edema   Sedated on then vent    R IJ TDC/  L arm AVF+bruit     CXR 9/22 - patchy perihilar dz, no edema    OP HD: [facility] MWF   4h 57mn  87kg  2/2 bath  TDC / L AVF  Hep none  -Mircera 150 mcg IV q 2 weeks (last dose 02/09/2020) -Venofer 100 mg IV X 8 doses (not started yet) -Calcitriol 1.5 mcg PO TIW  Assessment/ Plan: 1. [facility]  Remdesivir per protocol. Fevers down but WBC now rising, maybe d/t steroids.  2. VDRF - per CCM 3. AMS - multifactorial  4. Recent epidural abscess/MRSA cervical discitis-  Sp I&D 01/06/20 per neurosurgery. Continues on Vancomycin and Rifampin here through 10/01.  5.  ESRD - MWF HD. HD today in ICU.  6.  HD access - L BC AVF placed Feb 2021. Was used starting in May and had infiltration in Aug [facility]  7.  Hypertension/volume - 1kg under, BP's are okay, no pressors. Not tolerating any UF, no vol ^ on exam.  8.  Anemia  - HGB 8.5. ESA due this week. Ordered darbe 100 q wed to start tomorrow. Hold OP Fe load.  9.  Metabolic bone disease -Continue binders, VDRA 10.  Nutrition -NPO at present.  10.  DM-per primary   RKelly Splinter9/24/2021, 1:16 PM   Recent Labs  Lab 02/24/20 0330 02/24/20 0859 02/24/20 1624 02/25/20 0308 02/25/20 0311  K 5.1   < >  --  4.0 4.1  BUN 31*  --   --   --  52*  CREATININE 7.22*  --   --   --  8.86*  CALCIUM 8.2*  --   --   --   8.1*  PHOS 6.9*  --  5.4*  --  4.8*  HGB 8.7*   < >  --  9.2* 8.5*   < > = values in this interval not displayed.   Inpatient medications: . calcitRIOL  1.5 mcg Per Tube Q M,W,F-HD  . chlorhexidine gluconate (MEDLINE KIT)  15 mL Mouth Rinse BID  . Chlorhexidine Gluconate Cloth  6 each Topical Q0600  . darbepoetin (ARANESP) injection - DIALYSIS  100 mcg Intravenous Q Wed-HD  . feeding supplement (PROSource TF)  90 mL Per Tube TID  . heparin  5,000 Units Subcutaneous Q8H  . hydrocortisone sod succinate (SOLU-CORTEF) inj  100 mg Intravenous Once  . hydrocortisone sod succinate (SOLU-CORTEF) inj  50 mg Intravenous Q6H  . insulin aspart  0-20 Units Subcutaneous TID WC  . mouth rinse  15 mL Mouth Rinse 10 times per day  . midodrine  20 mg Per Tube  TID  . multivitamin  1 tablet Per Tube QHS  . pantoprazole sodium  40 mg Per Tube Daily  . sevelamer carbonate  1,600 mg Per Tube TID WC  . zinc sulfate  220 mg Per Tube Daily   . sodium chloride    . sodium chloride    . sodium chloride Stopped (02/21/20 2227)  . dexmedetomidine (PRECEDEX) IV infusion Stopped (02/25/20 1122)  . feeding supplement (VITAL 1.5 CAL) 30 mL/hr at 02/25/20 0503  . insulin 3 Units/hr (02/25/20 1252)  . magnesium sulfate bolus IVPB 4 g (02/25/20 1246)  . norepinephrine (LEVOPHED) Adult infusion 18 mcg/min (02/25/20 1200)  . piperacillin-tazobactam (ZOSYN)  IV Stopped (02/25/20 1156)  . vancomycin 1,000 mg (02/25/20 1157)   sodium chloride, sodium chloride, sodium chloride, alteplase, chlorpheniramine-HYDROcodone, fentaNYL (SUBLIMAZE) injection, guaiFENesin-dextromethorphan, heparin, lidocaine (PF), lidocaine-prilocaine, ondansetron **OR** ondansetron (ZOFRAN) IV, pentafluoroprop-tetrafluoroeth

## 2020-02-25 NOTE — Progress Notes (Signed)
[facility] Progress Note Patient Name: [name]. DOB: Apr 16, 1951 MRN: [id number]   Date of Service  02/25/2020  HPI/Events of Note  Patient has diarrhea.  eICU Interventions  Flexiseal ordered.        Kerry Kass Veverly Larimer 02/25/2020, 10:58 PM

## 2020-02-25 NOTE — Progress Notes (Signed)
NAME:  Rodney Maalouf., MRN:  [id number], DOB:  [birth year], LOS: 5 ADMISSION DATE:  02/20/2020, CONSULTATION DATE: 02/23/2020 REFERRING MD: Albertine Patricia, MD, CHIEF COMPLAINT: Altered mental status/hypotension  Brief History   69 year old male with end-stage renal disease and liver cirrhosis from hep C, recently diagnosed with cervical epidural abscess, rapid response was called for altered mental status, shock and hypoxemia.  He was intubated and was transferred to ICU  Past Medical History  End-stage renal disease on hemodialysis Liver cirrhosis due to hepatitis C Polysubstance abuse Colon cancer status post hemicolectomy Cervical spine epidural abscess  Significant Hospital Events   9/22 RRT was called, patient was intubated and transferred to ICU  Consults:  PCCM Nephrology GI  Procedures:  ET tube 9/22 Left IJ 9/22 Right radial A-line 9/22  Significant Diagnostic Tests:  9/19 CT spine without contrast: Postoperative findings status post posterior laminectomy and fusion of C4 through T1, with moderate disc space height loss and osteophytosis of C6-C7 and C7-T1. Prevertebral soft tissue edema appears reduced compared to prior Examination. Postoperative fluid collection overlying laminectomy site does not appear significantly changed compared to prior examination. Assessment of the soft tissues and epidural space is generally limited on noncontrast CT and additionally limited by metallic streak artifact; contrast enhanced MRI is the test of choice for evaluation of edema, fluid collections, the epidural space, and discitis osteomyelitis if clinically suspected in the postoperative setting.  9/19 CT head without contrast: No acute intracranial abnormalities. Chronic white matter changes. Chronic right thalamic lacunar infarct.  9/21 MRI cervical spine: . 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 Possible involvement of the right C2-C3 facet joint  also associated erosion of bone since 01/28/2020 as demonstrated by CT two days ago. Resolved posterior epidural abscess following surgery last month. Satisfactorily decompressed levels C4-C5 through T1-T2. Improved thecal sac patency but mild residual degenerative spinal stenosis at both C2-C3 and C3-C4. No definite spinal cord signal abnormality.  Micro Data:  9/19 Covid positive 9/20 MRSA PCR negative 9/20 -9/21 blood cultures negative 9/24 respiratory culture>> Antimicrobials:  9/19  Vancomycin  >> 9/19 rifampicin (on hold due to deranged LFTs) 9/24 Zosyn  Interim history/subjective:  Patient remained on mechanical ventilation, still requiring vasopressors Receiving hemodialysis LFTs are improving  Objective   Blood pressure 126/80, pulse 76, temperature (!) 97.4 F (36.3 C), temperature source Axillary, resp. rate 20, height 6' (1.829 m), weight 84.1 kg, SpO2 95 %.    Vent Mode: PRVC FiO2 (%):  [60 %] 60 % Set Rate:  [20 bmp] 20 bmp Vt Set:  [530 mL] 530 mL PEEP:  [5 cmH20] 5 cmH20 Plateau Pressure:  [18 cmH20-20 cmH20] 20 cmH20   Intake/Output Summary (Last 24 hours) at 02/25/2020 1255 Last data filed at 02/25/2020 1100 Gross per 24 hour  Intake 2220.03 ml  Output --  Net 2220.03 ml   Filed Weights   02/24/20 0500 02/25/20 0318 02/25/20 0915  Weight: 86.2 kg 84.3 kg 84.1 kg    Examination:   Physical exam: General: Chronically ill-appearing male, orally intubated, agitated HEAENT: /AT, eyes anicteric.  ETT and OGT in place Neuro: Sedated, not following commands.  Eyes are closed.  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 Remdesivir was discontinued due to acute rise in LFTs Continue IV vancomycin IV rifampicin is on hold due to deranged LFTs Patient is still requiring Levophed, he has significant pulse pressure variations, which is suggestive of fluid responsiveness We will give him 1 L of LR bolus We will try to taper off vasopressors, currently on Levophed of 10 mics Continue stress dose steroids with hydrocortisone 50 every 6 Patient's mental status still poor, will continue to monitor Patient was started on insulin drip yesterday with improvement in blood sugar, will switch over to long-acting insulin and sliding scale coverage Patient's LFTs are better today with AST dropping from 4000-2000 but ALT remain around 1000, picture is consistent with shock liver due to hypotension Continue to monitor LFTs Patient's INR is 2.3, he is not actively bleeding so we will continue to monitor  Poor prognosis due to multiorgan failure. Palliative care input is appreciated, patient's family would like to keep him full code  Best practice:  Diet: NPO Pain/Anxiety/Delirium protocol (if indicated): Fentanyl VAP protocol (if indicated): Yes DVT prophylaxis: Subcu heparin GI prophylaxis: Protonix Glucose control: N/A Mobility: Bedrest Code Status: Full code Family Communication: Patient's daughter was updated over the phone Disposition: ICU  Labs   CBC: Recent Labs  Lab  02/20/20 1315 02/21/20 0017 02/21/20 0434 02/21/20 0434 02/22/20 0837 02/22/20 0837 02/23/20 0404 02/23/20 0404 02/23/20 1531 02/23/20 1531 02/23/20 1548 02/24/20 0330 02/24/20 0859 02/25/20 0308 02/25/20 0311  WBC 4.7   < > 5.1   < > 5.1  --  7.6  --  15.7*  --   --  24.1*  --   --  10.4  NEUTROABS 2.4  --  3.5  --  2.7  --  5.5  --   --   --   --  22.9*  --   --   --   HGB 8.5*   < > 8.0*   < > 7.6*   < > 8.6*   < > 7.8*   < > 8.2* 8.7* 9.2* 9.2* 8.5*  HCT 26.7*   < > 24.8*   < > 23.3*   < > 25.7*   < > 24.0*   < > 24.0* 26.2* 27.0* 27.0* 25.6*  MCV 81.7   < > 77.7*   < > 76.9*  --  77.2*  --  80.0  --   --  79.9*  --   --  77.1*  PLT 181   < > 201   < > 238  --  309  --  364  --   --  386  --   --  212   < > = values in this interval not displayed.    Basic Metabolic Panel: Recent Labs  Lab 02/22/20 0837 02/22/20 0837 02/23/20 0404 02/23/20 0404 02/23/20 1531 02/23/20 1531 02/23/20 1548 02/24/20 0330 02/24/20 0859 02/24/20 1624 02/25/20 0308 02/25/20 0311  NA 134*   < > 135   < > 129*   < > 128* 127*  128*  --  136 134*  K 3.8   < > 4.1   < > 3.6   < > 3.8 5.1 4.6  --  4.0 4.1  CL 98  --  98  --  90*  --   --  92*  --   --   --  96*  CO2 18*  --  18*  --  15*  --   --  14*  --   --   --  21*  GLUCOSE 169*  --  107*  --  723*  --   --  698*  --   --   --  157*  BUN 53*  --  70*  --  22  --   --  31*  --   --   --  52*  CREATININE 10.29*  --  13.07*  --  5.95*  --   --  7.22*  --   --   --  8.86*  CALCIUM 7.7*  --  8.1*  --  8.0*  --   --  8.2*  --   --   --  8.1*  MG 2.0   < > 2.2  --  1.6*  --   --  2.0  --  1.9  --  1.8  PHOS 8.3*  --  9.5*  --   --   --   --  6.9*  --  5.4*  --  4.8*   < > = values in this interval not displayed.   GFR: Estimated Creatinine Clearance: 8.8 mL/min (A) (by C-G formula based on SCr of 8.86 mg/dL (H)). Recent Labs  Lab 02/20/20 1315 02/21/20 0017 02/21/20 0020 02/21/20 0434 02/21/20 0434 02/22/20 0837 02/22/20 0837  02/23/20 0404 02/23/20 1531 02/24/20 0330 02/25/20 0311  PROCALCITON 0.64  --   --  0.80  --  1.66  --   --   --   --   --   WBC 4.7   < >  --  5.1   < > 5.1   < > 7.6 15.7* 24.1* 10.4  LATICACIDVEN 1.1  --  1.8  --   --  2.3*  --   --   --   --   --    < > = values in this interval not displayed.    Liver Function Tests: Recent Labs  Lab 02/22/20 0837 02/23/20 0404 02/23/20 1531 02/24/20 0330 02/25/20 0311  AST 237* 2,439* 2,574* 4,427* 1,974*  ALT 74* 654* 717* 1,074* 1,050*  ALKPHOS 68 76 66 95 155*  BILITOT 1.1 1.1 1.0 1.3* 1.6*  PROT 7.3 7.9 6.2* 7.0 6.5  ALBUMIN 2.3* 2.6* 2.0* 2.6* 2.3*   No results for input(s): LIPASE, AMYLASE in the last 168 hours. Recent Labs  Lab 02/21/20 0019 02/23/20 0700  AMMONIA 29 32    ABG    Component Value Date/Time   PHART 7.429 02/25/2020 0308   PCO2ART 33.7 02/25/2020 0308   PO2ART 52 (L) 02/25/2020 0308   HCO3 22.3 02/25/2020 0308   TCO2 23 02/25/2020 0308   ACIDBASEDEF 2.0 02/25/2020 0308   O2SAT 88.0 02/25/2020 0308     Coagulation Profile: Recent Labs  Lab 02/21/20 0434 02/25/20 0311  INR 1.8* 2.0*    Cardiac Enzymes: No results for input(s): CKTOTAL, CKMB, CKMBINDEX, TROPONINI in the last 168 hours.  HbA1C: Hemoglobin A1C  Date/Time Value Ref Range Status  12/08/2013 09:21 AM 9.6  Final  09/15/2013 11:00  AM >14.0  Final   Hgb A1c MFr Bld  Date/Time Value Ref Range Status  01/12/2020 04:03 AM 6.5 (H) 4.8 - 5.6 % Final    Comment:    (NOTE) Pre diabetes:          5.7%-6.4%  Diabetes:              >6.4%  Glycemic control for   <7.0% adults with diabetes   03/02/2012 04:38 PM 6.2 (H) <5.7 % Final    Comment:    (NOTE)                                                                       According to the ADA Clinical Practice Recommendations for 2011, when HbA1c is used as a screening test:  >=6.5%   Diagnostic of Diabetes Mellitus           (if abnormal result is confirmed) 5.7-6.4%   Increased  risk of developing Diabetes Mellitus References:Diagnosis and Classification of Diabetes Mellitus,Diabetes DXAJ,2878,67(EHMCN 1):S62-S69 and Standards of Medical Care in         Diabetes - 2011,Diabetes OBSJ,6283,66 (Suppl 1):S11-S61.    CBG: Recent Labs  Lab 02/25/20 0821 02/25/20 0943 02/25/20 0952 02/25/20 1118 02/25/20 1250  GLUCAP 139* 87 114* 103* 119*    Review of Systems:   Unable to obtain as patient is intubated and encephalopathic.  Past Medical History  He,  has a past medical history of Anemia, Arthritis, Chronic kidney disease, Colon cancer ([place]), Colon polyp, DM II (diabetes mellitus, type II), controlled ([place]), 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 (12/25/2011), GERD (gastroesophageal reflux disease), Heart murmur, ileostomy (10/13/2012), Hyperlipidemia, Hypertension, Sleep apnea, and Wears glasses.   Surgical History    Past Surgical History:  Procedure Laterality Date  . APPLICATION OF WOUND VAC  02/16/2012   Procedure: APPLICATION OF WOUND VAC;  Surgeon: Zenovia Jarred, MD;  Location: [place];  Service: General;  Laterality: N/A;  . APPLICATION OF WOUND VAC  02/26/2012   Procedure: APPLICATION OF WOUND VAC;  Surgeon: Imogene Burn. [name], MD;  Location: [place];  Service: General;  Laterality: N/A;  . AV FISTULA PLACEMENT Left 07/29/2019   Procedure: ARTERIOVENOUS (AV) FISTULA CREATION;  Surgeon: Marty Heck, MD;  Location: [place];  Service: Vascular;  Laterality: Left;  . COLONOSCOPY    . COLONOSCOPY  02/06/2012   Procedure: COLONOSCOPY;  Surgeon: Milus Banister, MD;  Location: [place];  Service: Endoscopy;;  . COLONOSCOPY WITH PROPOFOL N/A 06/10/2019   Procedure: COLONOSCOPY WITH PROPOFOL;  Surgeon: Milus Banister, MD;  Location: [facility];  Service: Endoscopy;  Laterality: N/A;  . COLOSTOMY REVERSAL    . DRESSING CHANGE UNDER ANESTHESIA  02/18/2012   Procedure: DRESSING CHANGE UNDER ANESTHESIA;  Surgeon: Odis Hollingshead, MD;  Location: [place];   Service: General;  Laterality: N/A;  . ESOPHAGOGASTRODUODENOSCOPY (EGD) WITH PROPOFOL N/A 06/10/2019   Procedure: ESOPHAGOGASTRODUODENOSCOPY (EGD) WITH PROPOFOL;  Surgeon: Milus Banister, MD;  Location: [facility];  Service: Endoscopy;  Laterality: N/A;  . GANGLION CYST EXCISION  20 YRS AGO   RT ARM  . ILEOSTOMY  02/16/2012   Procedure: ILEOSTOMY;  Surgeon: Zenovia Jarred, MD;  Location: [place];  Service:  General;  Laterality: N/A;  . ILEOSTOMY CLOSURE N/A 09/02/2012   Procedure: ILEOSTOMY REVERSAL;  Surgeon: Imogene Burn. [name], MD;  Location: [place];  Service: General;  Laterality: N/A;  . ILIOSTOMY    . INCISION AND DRAINAGE OF WOUND  02/23/2012   Procedure: IRRIGATION AND DEBRIDEMENT WOUND;  Surgeon: Joyice Faster. Cornett, MD;  Location: [place];  Service: General;  Laterality: N/A;  . IR DIALY SHUNT INTRO [place] W/IMG LEFT Left 01/21/2020  . IR FLUORO GUIDE CV LINE RIGHT  01/21/2020  . IR REMOVAL TUN CV CATH W/O FL  01/07/2020  . IR US GUIDE VASC ACCESS LEFT  01/21/2020  . IR US GUIDE VASC ACCESS RIGHT  01/21/2020  . LAPAROTOMY  02/16/2012   Procedure: EXPLORATORY LAPAROTOMY;  Surgeon: Zenovia Jarred, MD;  Location: [place];  Service: General;  Laterality: N/A;  Exploratory Laparotomy with resection of anastomosis  . LAPAROTOMY  02/18/2012   Procedure: EXPLORATORY LAPAROTOMY;  Surgeon: Odis Hollingshead, MD;  Location: [place];  Service: General;  Laterality: N/A;  exploratory laparotomy  change of abdominal vac dressing  . LAPAROTOMY  02/20/2012   Procedure: EXPLORATORY LAPAROTOMY;  Surgeon: Odis Hollingshead, MD;  Location: [place];  Service: General;  Laterality: N/A;  . LAPAROTOMY  02/23/2012   Procedure: EXPLORATORY LAPAROTOMY;  Surgeon: Joyice Faster. Cornett, MD;  Location: [place];  Service: General;  Laterality: N/A;  Irrigation and Debridement of abdominal wound with wound vac change with partial closure  . LAPAROTOMY  02/26/2012   Procedure: EXPLORATORY LAPAROTOMY;  Surgeon: Imogene Burn.  [name], MD;  Location: [place];  Service: General;  Laterality: N/A;     . PARTIAL COLECTOMY  02/06/2012  . PARTIAL COLECTOMY  02/06/2012   Procedure: PARTIAL COLECTOMY;  Surgeon: Imogene Burn. [name], MD;  Location: [place];  Service: General;  Laterality: N/A;  right partial colectomy  . POSTERIOR CERVICAL FUSION/FORAMINOTOMY N/A 01/06/2020   Procedure: CERVICAL FOUR- THORACIC TWO LAMINECTOMY AND FUSION;  Surgeon: Consuella Lose, MD;  Location: [place];  Service: Neurosurgery;  Laterality: N/A;  CERVICAL FOUR- THORACIC TWO LAMINECTOMY AND FUSION  . RESECTION SMALL BOWEL / CLOSURE ILEOSTOMY  402/2014   Dr [name]  . VACUUM ASSISTED CLOSURE CHANGE  02/20/2012   Procedure: ABDOMINAL VACUUM ASSISTED CLOSURE CHANGE;  Surgeon: Odis Hollingshead, MD;  Location: [place];  Service: General;;  . VACUUM ASSISTED CLOSURE CHANGE  02/23/2012   Procedure: ABDOMINAL VACUUM ASSISTED CLOSURE CHANGE;  Surgeon: Joyice Faster. Cornett, MD;  Location: [place];  Service: General;  Laterality: N/A;     Social History   reports that he has been smoking cigarettes. He has a 15.00 pack-year smoking history. He has never used smokeless tobacco. He reports current alcohol use. He reports current drug use. Drug: Cocaine.   Family History   His family history includes Diabetes in his father and sister; Heart disease in his father and sister; Hypertension in his mother. There is no history of Colon cancer.   Allergies No Known Allergies   Home Medications  Prior to Admission medications   Medication Sig Start Date End Date Taking? Authorizing Provider  amLODipine (NORVASC) 10 MG tablet Take 1 tablet (10 mg total) by mouth at bedtime. 01/24/20 02/23/20 Yes [place] Eric J, DO  atorvastatin (LIPITOR) 20 MG tablet Take 1 tablet (20 mg total) by mouth at bedtime. 01/24/20 02/23/20 Yes [place] Donnamarie Poag, DO  calcitRIOL (ROCALTROL) 0.25 MCG capsule Take 5 capsules (1.25 mcg total) by mouth 3 (three) times  a week. Patient taking differently: Take 1.25  mcg by mouth every Monday, Wednesday, and Friday.  01/24/20 02/23/20 Yes [place] Eric J, DO  clonazePAM (KLONOPIN) 0.5 MG tablet Take 1 tablet (0.5 mg total) by mouth 2 (two) times daily as needed (Anxiety/agitation). 01/24/20 02/23/20 Yes [place] Eric J, DO  gabapentin (NEURONTIN) 100 MG capsule Take 100 mg by mouth at bedtime. Left arm pain for 28 days (ends 9.29.2021)   Yes [provider]  HYDROcodone-acetaminophen (NORCO/VICODIN) 5-325 MG tablet Take 1 tablet by mouth every 6 (six) hours as needed for moderate pain.   Yes [provider]  insulin detemir (LEVEMIR) 100 UNIT/ML injection Inject 0.11 mLs (11 Units total) into the skin 2 (two) times daily. 01/24/20 02/23/20 Yes [place] Eric J, DO  metoprolol tartrate (LOPRESSOR) 25 MG tablet Take 1 tablet (25 mg total) by mouth 2 (two) times daily. 01/24/20 02/23/20 Yes [place] Donnamarie Poag, DO  Multiple Vitamin (MULTIVITAMIN WITH MINERALS) TABS tablet Take 1 tablet by mouth daily.   Yes [provider]  polyethylene glycol (MIRALAX / GLYCOLAX) 17 g packet Take 17 g by mouth daily as needed for mild constipation.   Yes [provider]  QUEtiapine (SEROQUEL) 25 MG tablet Take 0.5 tablets (12.5 mg total) by mouth at bedtime. 01/24/20 02/23/20 Yes [place] Eric J, DO  rifampin (RIFADIN) 300 MG capsule Take 1 capsule (300 mg total) by mouth every 12 (twelve) hours. 4/69/62 95/2/84 Yes Delora Fuel, MD  sevelamer carbonate (RENVELA) 800 MG tablet Take 2 tablets (1,600 mg total) by mouth 3 (three) times daily with meals. 01/24/20 02/23/20 Yes [place] Donnamarie Poag, DO  Darbepoetin Alfa (ARANESP) 100 MCG/0.5ML SOSY injection Inject 0.5 mLs (100 mcg total) into the vein every Monday with hemodialysis. Patient not taking: Reported on 02/21/2020 01/24/20   [place] Eric J, DO  multivitamin (RENA-VIT) TABS tablet Take 1 tablet by mouth at bedtime. Patient not taking: Reported on 02/21/2020 01/24/20 02/23/20  [place] Eric J, DO     Total critical  care time: 35 minutes  Performed by: [place] care time was exclusive of separately billable procedures and treating other patients.   Critical care was necessary to treat or prevent imminent or life-threatening deterioration.   Critical care was time spent personally by me on the following activities: development of treatment plan with patient and/or surrogate as well as nursing, discussions with consultants, evaluation of patient's response to treatment, examination of patient, obtaining history from patient or surrogate, ordering and performing treatments and interventions, ordering and review of laboratory studies, ordering and review of radiographic studies, pulse oximetry and re-evaluation of patient's condition.   Jacky Kindle MD Critical care physician [facility]  Pager: [phone] Mobile: [phone]

## 2020-02-26 DIAGNOSIS — D638 Anemia in other chronic diseases classified elsewhere: Secondary | ICD-10-CM | POA: Diagnosis not present

## 2020-02-26 DIAGNOSIS — U071 COVID-19: Secondary | ICD-10-CM | POA: Diagnosis not present

## 2020-02-26 DIAGNOSIS — G062 Extradural and subdural abscess, unspecified: Secondary | ICD-10-CM

## 2020-02-26 DIAGNOSIS — E1129 Type 2 diabetes mellitus with other diabetic kidney complication: Secondary | ICD-10-CM

## 2020-02-26 DIAGNOSIS — G9341 Metabolic encephalopathy: Secondary | ICD-10-CM | POA: Diagnosis not present

## 2020-02-26 LAB — GLUCOSE, CAPILLARY
Glucose-Capillary: 109 mg/dL — ABNORMAL HIGH (ref 70–99)
Glucose-Capillary: 114 mg/dL — ABNORMAL HIGH (ref 70–99)
Glucose-Capillary: 202 mg/dL — ABNORMAL HIGH (ref 70–99)
Glucose-Capillary: 206 mg/dL — ABNORMAL HIGH (ref 70–99)
Glucose-Capillary: 269 mg/dL — ABNORMAL HIGH (ref 70–99)
Glucose-Capillary: 300 mg/dL — ABNORMAL HIGH (ref 70–99)
Glucose-Capillary: 58 mg/dL — ABNORMAL LOW (ref 70–99)
Glucose-Capillary: 73 mg/dL (ref 70–99)

## 2020-02-26 LAB — POCT I-STAT 7, (LYTES, BLD GAS, ICA,H+H)
Acid-Base Excess: 1 mmol/L (ref 0.0–2.0)
Bicarbonate: 24 mmol/L (ref 20.0–28.0)
Calcium, Ion: 1.03 mmol/L — ABNORMAL LOW (ref 1.15–1.40)
HCT: 21 % — ABNORMAL LOW (ref 39.0–52.0)
Hemoglobin: 7.1 g/dL — ABNORMAL LOW (ref 13.0–17.0)
O2 Saturation: 98 %
Patient temperature: 98.6
Potassium: 3.9 mmol/L (ref 3.5–5.1)
Sodium: 132 mmol/L — ABNORMAL LOW (ref 135–145)
TCO2: 25 mmol/L (ref 22–32)
pCO2 arterial: 30.7 mmHg — ABNORMAL LOW (ref 32.0–48.0)
pH, Arterial: 7.502 — ABNORMAL HIGH (ref 7.350–7.450)
pO2, Arterial: 94 mmHg (ref 83.0–108.0)

## 2020-02-26 LAB — COMPREHENSIVE METABOLIC PANEL
ALT: 625 U/L — ABNORMAL HIGH (ref 0–44)
AST: 697 U/L — ABNORMAL HIGH (ref 15–41)
Albumin: 1.7 g/dL — ABNORMAL LOW (ref 3.5–5.0)
Alkaline Phosphatase: 134 U/L — ABNORMAL HIGH (ref 38–126)
Anion gap: 14 (ref 5–15)
BUN: 49 mg/dL — ABNORMAL HIGH (ref 8–23)
CO2: 23 mmol/L (ref 22–32)
Calcium: 7.3 mg/dL — ABNORMAL LOW (ref 8.9–10.3)
Chloride: 94 mmol/L — ABNORMAL LOW (ref 98–111)
Creatinine, Ser: 5.84 mg/dL — ABNORMAL HIGH (ref 0.61–1.24)
GFR calc Af Amer: 11 mL/min — ABNORMAL LOW (ref >60)
GFR calc non Af Amer: 9 mL/min — ABNORMAL LOW (ref >60)
Glucose, Bld: 320 mg/dL — ABNORMAL HIGH (ref 70–99)
Potassium: 3.9 mmol/L (ref 3.5–5.1)
Sodium: 131 mmol/L — ABNORMAL LOW (ref 135–145)
Total Bilirubin: 1.3 mg/dL — ABNORMAL HIGH (ref 0.3–1.2)
Total Protein: 5.6 g/dL — ABNORMAL LOW (ref 6.5–8.1)

## 2020-02-26 LAB — CULTURE, BLOOD (ROUTINE X 2)
Culture: NO GROWTH
Culture: NO GROWTH
Culture: NO GROWTH
Special Requests: ADEQUATE
Special Requests: ADEQUATE
Special Requests: ADEQUATE

## 2020-02-26 LAB — PROTIME-INR
INR: 1.7 — ABNORMAL HIGH (ref 0.8–1.2)
Prothrombin Time: 19.7 seconds — ABNORMAL HIGH (ref 11.4–15.2)

## 2020-02-26 MED ORDER — INSULIN GLARGINE 100 UNIT/ML ~~LOC~~ SOLN
10.0000 [IU] | Freq: Every day | SUBCUTANEOUS | Status: DC
  Filled 2020-02-26: qty 0.1

## 2020-02-26 MED ORDER — RISPERIDONE 1 MG PO TBDP: 1.0000 mg | ORAL_TABLET | Freq: Every day | ORAL | Status: DC

## 2020-02-26 MED ORDER — INSULIN GLARGINE 100 UNIT/ML ~~LOC~~ SOLN: 20.0000 [IU] | Freq: Every day | SUBCUTANEOUS | Status: DC

## 2020-02-26 MED ORDER — DEXTROSE-NACL 5-0.9 % IV SOLN: INTRAVENOUS | Status: DC

## 2020-02-26 MED ORDER — DEXTROSE 50 % IV SOLN
INTRAVENOUS | Status: AC
  Administered 2020-02-26: 50 mL
  Filled 2020-02-26: qty 50

## 2020-02-26 MED ORDER — HYDROCORTISONE NA SUCCINATE PF 100 MG IJ SOLR
50.0000 mg | Freq: Two times a day (BID) | INTRAMUSCULAR | Status: AC
  Administered 2020-02-26 – 2020-02-27 (×3): 50 mg via INTRAVENOUS
  Filled 2020-02-26 (×3): qty 2

## 2020-02-26 MED ORDER — HYDROCORTISONE NA SUCCINATE PF 100 MG IJ SOLR
50.0000 mg | Freq: Every day | INTRAMUSCULAR | Status: AC
  Administered 2020-02-28: 50 mg via INTRAVENOUS
  Filled 2020-02-26: qty 2

## 2020-02-26 MED ORDER — HALOPERIDOL LACTATE 5 MG/ML IJ SOLN
5.0000 mg | Freq: Four times a day (QID) | INTRAMUSCULAR | Status: DC | PRN
  Administered 2020-02-27 – 2020-02-28 (×3): 5 mg via INTRAVENOUS
  Filled 2020-02-26 (×4): qty 1

## 2020-02-26 MED ORDER — DEXTROSE 10 % IV SOLN: INTRAVENOUS | Status: DC

## 2020-02-26 NOTE — Progress Notes (Deleted)
   02/26/20 1130  Clinical Encounter Type  Visited With Family;Health care provider  Visit Type Initial;Death  Referral From Nurse   Chaplain responded to patient's death to meet with the family. Chaplain met family on the way out of the unit, and they declined services. Spiritual care services available as needed.   Jeri Lager, Chaplain

## 2020-02-26 NOTE — Progress Notes (Signed)
NAME:  Rodney Fauver., MRN:  [id number], DOB:  04-Aug-1950, LOS: 6 ADMISSION DATE:  02/20/2020, CONSULTATION DATE: 02/23/2020 REFERRING MD: Albertine Patricia, MD, CHIEF COMPLAINT: Altered mental status/hypotension  Brief History   69 year old male with end-stage renal disease and liver cirrhosis from hep C, recently diagnosed with cervical epidural abscess, rapid response was called for altered mental status, shock and hypoxemia.  He was intubated and was transferred to ICU  Past Medical History  End-stage renal disease on hemodialysis Liver cirrhosis due to hepatitis C Polysubstance abuse Colon cancer status post hemicolectomy Cervical spine epidural abscess  Significant Hospital Events   9/22 RRT was called, patient was intubated and transferred to ICU  Consults:  PCCM Nephrology GI  Procedures:  ET tube 9/22 Left IJ 9/22 Right radial A-line 9/22  Significant Diagnostic Tests:  9/19 CT spine without contrast: Postoperative findings status post posterior laminectomy and fusion of C4 through T1, with moderate disc space height loss and osteophytosis of C6-C7 and C7-T1. Prevertebral soft tissue edema appears reduced compared to prior Examination. Postoperative fluid collection overlying laminectomy site does not appear significantly changed compared to prior examination. Assessment of the soft tissues and epidural space is generally limited on noncontrast CT and additionally limited by metallic streak artifact; contrast enhanced MRI is the test of choice for evaluation of edema, fluid collections, the epidural space, and discitis osteomyelitis if clinically suspected in the postoperative setting.  9/19 CT head without contrast: No acute intracranial abnormalities. Chronic white matter changes. Chronic right thalamic lacunar infarct.  9/21 MRI cervical spine: . 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 Possible involvement of the right C2-C3 facet joint  also associated erosion of bone since 01/28/2020 as demonstrated by CT two days ago. Resolved posterior epidural abscess following surgery last month. Satisfactorily decompressed levels C4-C5 through T1-T2. Improved thecal sac patency but mild residual degenerative spinal stenosis at both C2-C3 and C3-C4. No definite spinal cord signal abnormality.  Micro Data:  9/19 Covid positive 9/20 MRSA PCR negative 9/20 -9/21 blood cultures negative 9/24 respiratory culture>> Antimicrobials:  9/19  Vancomycin  >> 9/19 rifampicin (on hold due to deranged LFTs) 9/24 Zosyn  Interim history/subjective:  Patient remained on mechanical ventilation, still requiring vasopressors Receiving hemodialysis LFTs are improving  Objective   Blood pressure (!) 136/54, pulse (!) 49, temperature 98 F (36.7 C), temperature source Axillary, resp. rate (!) 25, height 6' (1.829 m), weight 88.4 kg, SpO2 100 %.    Vent Mode: PSV FiO2 (%):  [40 %] 40 % PEEP:  [5 cmH20-8 cmH20] 5 cmH20 Pressure Support:  [8 cmH20-13 cmH20] 10 cmH20   Intake/Output Summary (Last 24 hours) at 02/26/2020 1304 Last data filed at 02/26/2020 0600 Gross per 24 hour  Intake 1460.5 ml  Output 0 ml  Net 1460.5 ml   Filed Weights   02/25/20 0318 02/25/20 0915 02/26/20 0300  Weight: 84.3 kg 84.1 kg 88.4 kg    Examination:   Physical exam: General: Chronically ill-appearing male, orally intubated, agitated HEAENT: Ashley/AT, eyes anicteric.  ETT and OGT in place Neuro: alert, responds to pain and verbal stimuli. Does not follow commands. Chest: Fine crackles at the bases bilaterally, no wheezes or rhonchi Heart: Regular rate and rhythm, no murmurs or gallops Abdomen: Soft, nontender, nondistended, bowel sounds present Skin: No rash  Resolved Hospital Problem list   N/A  Assessment & Plan:   [name]. is a 69 y.o. 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 Extubated today. Will monitor closely for reintubation. He is purposeful but does not follow commands. 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  Acute metabolic encephalopathy Multifactorial potentially from sepsis, covid, hyperglycemia. Possible also hepatic encephalopathy  Hold sedating agents.  Shock liver in the setting of hep C cirrhosis and sepsis Continue to monitor LFTs Coagulopathy without signs of bleeding  End-stage renal disease on hemodialysis Last dialysis Friday, nephrology following  Poorly controlled Type 2 DM lantus increased today to 20 units.   Poor prognosis due to multiorgan failure. Palliative care input is appreciated, patient's family would like to keep him full code  Best practice:  Diet: NPO Pain/Anxiety/Delirium protocol (if indicated): dexmedetomidine VAP protocol (if indicated): Yes DVT prophylaxis: Subcu heparin GI prophylaxis: Protonix Glucose control: poorly controlled in the setting of sepsis. See above. Mobility: Bedrest Code Status: Full code Family Communication: called daughter Debroah Baller on 9/25 at 1:15pm and mailbox was full and unable to leave voicemail. Disposition: ICU  Labs   CBC: Recent Labs  Lab 02/20/20 1315 02/21/20 0017 02/21/20 0434 02/21/20 0434 02/22/20 2706 02/22/20 0837 02/23/20 0404 02/23/20 0404 02/23/20 1531 02/23/20 1548 02/24/20 0330 02/24/20 0859 02/25/20 0308 02/25/20 0311 02/26/20 0540  WBC 4.7   < > 5.1   < > 5.1   --  7.6  --  15.7*  --  24.1*  --   --  10.4  --   NEUTROABS 2.4  --  3.5  --  2.7  --  5.5  --   --   --  22.9*  --   --   --   --   HGB 8.5*   < > 8.0*   < > 7.6*   < > 8.6*   < > 7.8*   < > 8.7* 9.2* 9.2* 8.5* 7.1*  HCT 26.7*   < > 24.8*   < > 23.3*   < > 25.7*   < > 24.0*   < > 26.2* 27.0* 27.0* 25.6* 21.0*  MCV 81.7   < > 77.7*   < > 76.9*  --  77.2*  --  80.0  --  79.9*  --   --  77.1*  --   PLT 181   < > 201   < > 238  --  309  --  364  --  386  --   --  212  --    < > = values in this interval not displayed.    Basic Metabolic Panel: Recent Labs  Lab 02/23/20 0404 02/23/20 0404 02/23/20 1531 02/23/20 1548 02/24/20 0330 02/24/20 0330 02/24/20 0859 02/24/20 1624 02/25/20 0308 02/25/20 0311 02/25/20 1718 02/26/20 0411 02/26/20 0540  NA 135   < > 129*   < > 127*   < > 128*  --  136 134*  --  131* 132*  K 4.1   < > 3.6   < > 5.1   < > 4.6  --  4.0 4.1  --  3.9 3.9  CL 98  --  90*  --  92*  --   --   --   --  96*  --  94*  --   CO2 18*  --  15*  --  14*  --   --   --   --  21*  --  23  --   GLUCOSE 107*  --  723*  --  698*  --   --   --   --  157*  --  320*  --   BUN 70*  --  22  --  31*  --   --   --   --  52*  --  49*  --   CREATININE 13.07*  --  5.95*  --  7.22*  --   --   --   --  8.86*  --  5.84*  --   CALCIUM 8.1*  --  8.0*  --  8.2*  --   --   --   --  8.1*  --  7.3*  --   MG 2.2   < > 1.6*  --  2.0  --   --  1.9  --  1.8 2.7*  --   --   PHOS 9.5*  --   --   --  6.9*  --   --  5.4*  --  4.8* 1.8*  --   --    < > = values in this interval not displayed.   GFR: Estimated Creatinine Clearance: 13.3 mL/min (A) (by C-G formula based on SCr of 5.84 mg/dL (H)). Recent Labs  Lab 02/20/20 1315 02/21/20 0017 02/21/20 0020 02/21/20 0434 02/21/20 0434 02/22/20 0837 02/22/20 0837 02/23/20 0404 02/23/20 1531 02/24/20 0330 02/25/20 0311  PROCALCITON 0.64  --   --  0.80  --  1.66  --   --   --   --   --   WBC 4.7   < >  --  5.1   < > 5.1   < > 7.6 15.7* 24.1* 10.4   LATICACIDVEN 1.1  --  1.8  --   --  2.3*  --   --   --   --   --    < > = values in this interval not displayed.    Liver Function Tests: Recent Labs  Lab 02/23/20 0404 02/23/20 1531 02/24/20 0330 02/25/20 0311 02/26/20 0411  AST 2,439* 2,574* 4,427* 1,974* 697*  ALT 654* 717* 1,074* 1,050* 625*  ALKPHOS 76 66 95 155* 134*  BILITOT 1.1 1.0 1.3* 1.6* 1.3*  PROT 7.9 6.2* 7.0 6.5 5.6*  ALBUMIN 2.6* 2.0* 2.6* 2.3* 1.7*   No results for input(s): LIPASE, AMYLASE in the last 168 hours. Recent Labs  Lab 02/21/20 0019 02/23/20 0700  AMMONIA 29 32    ABG    Component Value Date/Time   PHART 7.502 (H) 02/26/2020 0540   PCO2ART 30.7 (L) 02/26/2020 0540   PO2ART 94 02/26/2020 0540   HCO3 24.0 02/26/2020 0540   TCO2 25 02/26/2020 0540   ACIDBASEDEF 2.0 02/25/2020 0308   O2SAT 98.0 02/26/2020 0540     Coagulation Profile: Recent Labs  Lab 02/21/20 0434 02/25/20 0311 02/26/20 0411  INR 1.8* 2.0* 1.7*    Cardiac Enzymes: No results for input(s): CKTOTAL, CKMB, CKMBINDEX, TROPONINI in the last 168 hours.  HbA1C: Hemoglobin A1C  Date/Time Value Ref Range Status  12/08/2013 09:21 AM 9.6  Final  09/15/2013 11:00 AM >14.0  Final   Hgb A1c MFr  Bld  Date/Time Value Ref Range Status  01/12/2020 04:03 AM 6.5 (H) 4.8 - 5.6 % Final    Comment:    (NOTE) Pre diabetes:          5.7%-6.4%  Diabetes:              >6.4%  Glycemic control for   <7.0% adults with diabetes   03/02/2012 04:38 PM 6.2 (H) <5.7 % Final    Comment:    (NOTE)                                                                       According to the ADA Clinical Practice Recommendations for 2011, when HbA1c is used as a screening test:  >=6.5%   Diagnostic of Diabetes Mellitus           (if abnormal result is confirmed) 5.7-6.4%   Increased risk of developing Diabetes Mellitus References:Diagnosis and Classification of Diabetes Mellitus,Diabetes [id number],38(SNKNL 1):S62-S69 and Standards of  Medical Care in         Diabetes - 2011,Diabetes [id number],37 (Suppl 1):S11-S61.    CBG: Recent Labs  Lab 02/25/20 2132 02/26/20 0024 02/26/20 0409 02/26/20 0832 02/26/20 1217  GLUCAP 168* 206* 300* 269* 202*    Review of Systems:   Unable to obtain as patient is intubated and encephalopathic.  Past Medical History  He,  has a past medical history of Anemia, Arthritis, Chronic kidney disease, Colon cancer ([place]), Colon polyp, DM II (diabetes mellitus, type II), controlled ([place]), 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 (12/25/2011), GERD (gastroesophageal reflux disease), Heart murmur, ileostomy (10/13/2012), Hyperlipidemia, Hypertension, Sleep apnea, and Wears glasses.   Surgical History    Past Surgical History:  Procedure Laterality Date  . APPLICATION OF WOUND VAC  02/16/2012   Procedure: APPLICATION OF WOUND VAC;  Surgeon: Zenovia Jarred, MD;  Location: [facility];  Service: General;  Laterality: N/A;  . APPLICATION OF WOUND VAC  02/26/2012   Procedure: APPLICATION OF WOUND VAC;  Surgeon: Imogene Burn. [name], MD;  Location: [place];  Service: General;  Laterality: N/A;  . AV FISTULA PLACEMENT Left 07/29/2019   Procedure: ARTERIOVENOUS (AV) FISTULA CREATION;  Surgeon: Marty Heck, MD;  Location: [facility];  Service: Vascular;  Laterality: Left;  . COLONOSCOPY    . COLONOSCOPY  02/06/2012   Procedure: COLONOSCOPY;  Surgeon: Milus Banister, MD;  Location: [facility];  Service: Endoscopy;;  . COLONOSCOPY WITH PROPOFOL N/A 06/10/2019   Procedure: COLONOSCOPY WITH PROPOFOL;  Surgeon: Milus Banister, MD;  Location: [facility];  Service: Endoscopy;  Laterality: N/A;  . COLOSTOMY REVERSAL    . DRESSING CHANGE UNDER ANESTHESIA  02/18/2012   Procedure: DRESSING CHANGE UNDER ANESTHESIA;  Surgeon: Odis Hollingshead, MD;  Location: [facility];  Service: General;  Laterality: N/A;  . ESOPHAGOGASTRODUODENOSCOPY (EGD) WITH PROPOFOL N/A 06/10/2019   Procedure: ESOPHAGOGASTRODUODENOSCOPY (EGD) WITH  PROPOFOL;  Surgeon: Milus Banister, MD;  [facility];  Service: Endoscopy;  Laterality: N/A;  . GANGLION CYST EXCISION  20 YRS AGO   RT ARM  . ILEOSTOMY  02/16/2012   Procedure: ILEOSTOMY;  Surgeon: Zenovia Jarred, MD;  Location: [facility];  Service: General;  Laterality: N/A;  . ILEOSTOMY CLOSURE N/A  09/02/2012   Procedure: ILEOSTOMY REVERSAL;  Surgeon: Imogene Burn. [name], MD;  Location: [place] [facility] [facility];  Service: General;  Laterality: N/A;  . ILIOSTOMY    . INCISION AND DRAINAGE OF WOUND  02/23/2012   Procedure: IRRIGATION AND DEBRIDEMENT WOUND;  Surgeon: Joyice Faster. Cornett, MD;  Location: [facility];  Service: General;  Laterality: N/A;  . IR DIALY SHUNT INTRO [place] W/IMG LEFT Left 01/21/2020  . IR FLUORO GUIDE CV LINE RIGHT  01/21/2020  . IR REMOVAL TUN CV CATH W/O FL  01/07/2020  . IR US GUIDE VASC ACCESS LEFT  01/21/2020  . IR US GUIDE VASC ACCESS RIGHT  01/21/2020  . LAPAROTOMY  02/16/2012   Procedure: EXPLORATORY LAPAROTOMY;  Surgeon: Zenovia Jarred, MD;  Location: [place] [facility];  Service: General;  Laterality: N/A;  Exploratory Laparotomy with resection of anastomosis  . LAPAROTOMY  02/18/2012   Procedure: EXPLORATORY LAPAROTOMY;  Surgeon: Odis Hollingshead, MD;  Location: [facility];  Service: General;  Laterality: N/A;  exploratory laparotomy  change of abdominal vac dressing  . LAPAROTOMY  02/20/2012   Procedure: EXPLORATORY LAPAROTOMY;  Surgeon: Odis Hollingshead, MD;  Location: [facility];  Service: General;  Laterality: N/A;  . LAPAROTOMY  02/23/2012   Procedure: EXPLORATORY LAPAROTOMY;  Surgeon: Joyice Faster. Cornett, MD;  Location: [facility];  Service: General;  Laterality: N/A;  Irrigation and Debridement of abdominal wound with wound vac change with partial closure  . LAPAROTOMY  02/26/2012   Procedure: EXPLORATORY LAPAROTOMY;  Surgeon: Imogene Burn. [name], MD;  Location: [facility];  Service: General;  Laterality: N/A;     . PARTIAL COLECTOMY  02/06/2012  . PARTIAL COLECTOMY  02/06/2012   Procedure:  PARTIAL COLECTOMY;  Surgeon: Imogene Burn. [name], MD;  Location: [place];  Service: General;  Laterality: N/A;  right partial colectomy  . POSTERIOR CERVICAL FUSION/FORAMINOTOMY N/A 01/06/2020   Procedure: CERVICAL FOUR- THORACIC TWO LAMINECTOMY AND FUSION;  Surgeon: Consuella Lose, MD;  Location: [facility];  Service: Neurosurgery;  Laterality: N/A;  CERVICAL FOUR- THORACIC TWO LAMINECTOMY AND FUSION  . RESECTION SMALL BOWEL / CLOSURE ILEOSTOMY  402/2014   Dr [name]  . VACUUM ASSISTED CLOSURE CHANGE  02/20/2012   Procedure: ABDOMINAL VACUUM ASSISTED CLOSURE CHANGE;  Surgeon: Odis Hollingshead, MD;  Location: [facility];  Service: General;;  . VACUUM ASSISTED CLOSURE CHANGE  02/23/2012   Procedure: ABDOMINAL VACUUM ASSISTED CLOSURE CHANGE;  Surgeon: Joyice Faster. Cornett, MD;  Location: [facility];  Service: General;  Laterality: N/A;     Social History   reports that he has been smoking cigarettes. He has a 15.00 pack-year smoking history. He has never used smokeless tobacco. He reports current alcohol use. He reports current drug use. Drug: Cocaine.   Family History   His family history includes Diabetes in his father and sister; Heart disease in his father and sister; Hypertension in his mother. There is no history of Colon cancer.   Allergies No Known Allergies   Home Medications  Prior to Admission medications   Medication Sig Start Date End Date Taking? Authorizing Provider  amLODipine (NORVASC) 10 MG tablet Take 1 tablet (10 mg total) by mouth at bedtime. 01/24/20 02/23/20 Yes [place] Eric J, DO  atorvastatin (LIPITOR) 20 MG tablet Take 1 tablet (20 mg total) by mouth at bedtime. 01/24/20 02/23/20 Yes [place] Donnamarie Poag, DO  calcitRIOL (ROCALTROL) 0.25 MCG capsule Take 5 capsules (1.25 mcg total) by mouth 3 (three) times a week. Patient taking differently: Take 1.25 mcg by  mouth every Monday, Wednesday, and Friday.  01/24/20 02/23/20 Yes [place] Eric J, DO  clonazePAM (KLONOPIN) 0.5 MG tablet Take 1 tablet (0.5 mg  total) by mouth 2 (two) times daily as needed (Anxiety/agitation). 01/24/20 02/23/20 Yes [place] Eric J, DO  gabapentin (NEURONTIN) 100 MG capsule Take 100 mg by mouth at bedtime. Left arm pain for 28 days (ends 9.29.2021)   Yes [provider]  HYDROcodone-acetaminophen (NORCO/VICODIN) 5-325 MG tablet Take 1 tablet by mouth every 6 (six) hours as needed for moderate pain.   Yes [provider]  insulin detemir (LEVEMIR) 100 UNIT/ML injection Inject 0.11 mLs (11 Units total) into the skin 2 (two) times daily. 01/24/20 02/23/20 Yes [place] Eric J, DO  metoprolol tartrate (LOPRESSOR) 25 MG tablet Take 1 tablet (25 mg total) by mouth 2 (two) times daily. 01/24/20 02/23/20 Yes [place] Donnamarie Poag, DO  Multiple Vitamin (MULTIVITAMIN WITH MINERALS) TABS tablet Take 1 tablet by mouth daily.   Yes [provider]  polyethylene glycol (MIRALAX / GLYCOLAX) 17 g packet Take 17 g by mouth daily as needed for mild constipation.   Yes [provider]  QUEtiapine (SEROQUEL) 25 MG tablet Take 0.5 tablets (12.5 mg total) by mouth at bedtime. 01/24/20 02/23/20 Yes [place] Eric J, DO  rifampin (RIFADIN) 300 MG capsule Take 1 capsule (300 mg total) by mouth every 12 (twelve) hours. 2/37/62 83/1/51 Yes Delora Fuel, MD  sevelamer carbonate (RENVELA) 800 MG tablet Take 2 tablets (1,600 mg total) by mouth 3 (three) times daily with meals. 01/24/20 02/23/20 Yes [place] Donnamarie Poag, DO  Darbepoetin Alfa (ARANESP) 100 MCG/0.5ML SOSY injection Inject 0.5 mLs (100 mcg total) into the vein every Monday with hemodialysis. Patient not taking: Reported on 02/21/2020 01/24/20   [place] Eric J, DO  multivitamin (RENA-VIT) TABS tablet Take 1 tablet by mouth at bedtime. Patient not taking: Reported on 02/21/2020 01/24/20 02/23/20  [place] Eric J, DO     The patient is critically ill with multiple organ systems failure and requires high complexity decision making for assessment and support, frequent evaluation and  titration of therapies, application of advanced monitoring technologies and extensive interpretation of multiple databases.   Critical Care Time devoted to patient care services described in this note is 42 minutes. This time reflects time of care of this [name] . This critical care time does not reflect separately billable procedures or procedure time, teaching time or supervisory time of PA/NP/Med student/Med Resident etc but could involve care discussion time.  [facility] 02/26/2020 1:05 PM  Pager: [phone] After hours pager: [phone]

## 2020-02-26 NOTE — Progress Notes (Signed)
MD notified: rhythm changes for approx 1hr with wide QRS & ST elevation-no change in mentation.  Saved the strip in his chart.  Was going to get an EKG but is back to SR/SB.

## 2020-02-26 NOTE — Progress Notes (Signed)
[facility] Progress Note Patient Name: [name]. DOB: 1950/12/15 MRN: [id number]   Date of Service  02/26/2020  HPI/Events of Note  Notified of hypoglycemic episodes. Recently extubated and remains NPO. Patient is anuric ESRD  eICU Interventions  Will start D10 NS at 20 cc/hr     Intervention Category Major Interventions: Other:  Judd Lien 02/26/2020, 8:36 PM

## 2020-02-26 NOTE — Progress Notes (Signed)
[facility] Progress Note  Subjective:  Patient seen in ICU, not responsive on the vent   Vitals:   02/26/20 0900 02/26/20 0959 02/26/20 1000 02/26/20 1021  BP: (!) 126/52  (!) 136/54   Pulse: (!) 52 (!) 50 (!) 49 (!) 49  Resp: (!) 29 (!) 30 (!) 30 (!) 25  Temp:      TempSrc:      SpO2: 100% 100% 100% 100%  Weight:      Height:        Exam:  on vent, sedated   no jvd  Chest cta bilat ant/ lat  Cor reg no RG  Abd soft ntnd no ascites obese   Ext no LE edema   Sedated on then vent    R IJ TDC/  L arm AVF+bruit     CXR 9/22 - patchy perihilar dz, no edema    OP HD: [facility] MWF   4h 36mn  87kg  2/2 bath  TDC / L AVF  Hep none  -Mircera 150 mcg IV q 2 weeks (last dose 02/09/2020) -Venofer 100 mg IV X 8 doses (not started yet) -Calcitriol 1.5 mcg PO TIW  Assessment/ Plan: 1. COVID 19-recent diagnosis. [facility].  Remdesivir per protocol. Fevers down but WBC now rising, maybe d/t steroids.  2. VDRF - per CCM 3. AMS - multifactorial  4. Recent epidural abscess/MRSA cervical discitis-  Sp I&D 01/06/20 per neurosurgery. Continues on Vancomycin and Rifampin here through 10/01.  5.  ESRD - MWF HD. HD Monday 6.  HD access - L BC AVF placed Feb 2021. Was used starting in May and had infiltration in Aug [facility]  7.  Hypertension/volume - 1kg under, BP's are okay, no pressors. Not tolerating any UF, no vol ^ on exam.  8.  Anemia  - HGB 8.5. ESA due this week. Ordered darbe 100 q wed to start tomorrow. Hold OP Fe load.  9.  Metabolic bone disease -Continue binders, VDRA 10.  Nutrition -NPO at present.  10.  DM-per primary   RKelly Splinter9/25/2021, 9:01 AM   Recent Labs  Lab 02/25/20 0311 02/25/20 0311 02/25/20 1718 02/26/20 0411 02/26/20 0540  K 4.1   < >  --  3.9 3.9  BUN 52*  --   --  49*  --   CREATININE 8.86*  --   --  5.84*  --   CALCIUM 8.1*  --   --  7.3*  --   PHOS  4.8*  --  1.8*  --   --   HGB 8.5*  --   --   --  7.1*   < > = values in this interval not displayed.   Inpatient medications:  calcitRIOL  1.5 mcg Per Tube Q M,W,F-HD   chlorhexidine gluconate (MEDLINE KIT)  15 mL Mouth Rinse BID   Chlorhexidine Gluconate Cloth  6 each Topical Q0600   darbepoetin (ARANESP) injection - DIALYSIS  100 mcg Intravenous Q Wed-HD   feeding supplement (PROSource TF)  90 mL Per Tube TID   heparin  5,000 Units Subcutaneous Q8H   hydrocortisone sod succinate (SOLU-CORTEF) inj  100 mg Intravenous Once   hydrocortisone sod succinate (SOLU-CORTEF) inj  50 mg Intravenous Q6H   insulin aspart  0-20 Units Subcutaneous Q4H   insulin aspart  5 Units Subcutaneous Q4H   insulin glargine  15 Units Subcutaneous Daily   mouth  rinse  15 mL Mouth Rinse 10 times per day   midodrine  20 mg Per Tube TID   multivitamin  1 tablet Per Tube QHS   pantoprazole sodium  40 mg Per Tube Daily   sevelamer carbonate  1,600 mg Per Tube TID WC   zinc sulfate  220 mg Per Tube Daily    sodium chloride     sodium chloride     sodium chloride Stopped (02/21/20 2227)   dexmedetomidine (PRECEDEX) IV infusion 0.8 mcg/kg/hr (02/26/20 0800)   feeding supplement (VITAL 1.5 CAL) 1,000 mL (02/26/20 0800)   norepinephrine (LEVOPHED) Adult infusion Stopped (02/25/20 2300)   piperacillin-tazobactam (ZOSYN)  IV 2.25 g (02/26/20 1011)   vancomycin 1,000 mg (02/25/20 1157)   sodium chloride, sodium chloride, sodium chloride, alteplase, chlorpheniramine-HYDROcodone, fentaNYL (SUBLIMAZE) injection, guaiFENesin-dextromethorphan, heparin, lidocaine (PF), lidocaine-prilocaine, ondansetron **OR** ondansetron (ZOFRAN) IV, pentafluoroprop-tetrafluoroeth

## 2020-02-26 NOTE — Procedures (Signed)
Extubation Procedure Note  Patient Details:   Name: [name]. DOB: 11-Jan-1951 MRN: [id number]   Airway Documentation:    Vent end date: 02/26/20 Vent end time: 0952   Evaluation  O2 sats: stable throughout Complications: No apparent complications Patient did tolerate procedure well. Bilateral Breath Sounds: Clear, Diminished   Patient extubated per MD order & placed on 6L [place]. Patient will not speak or cough when asked to.  Kathie Dike 02/26/2020, 9:58 AM

## 2020-02-26 NOTE — Progress Notes (Signed)
MD Notified: Part 1: Now on room air & 100%. Pt became restless and uncooperative after Dex had been off- pulled off mittens, gown, condom cath, kicking legs off bed, saying 'No' to everything, refusing ice, oral care, temp and accu checks. Put him back on Dex at low rate, gave warm blankets, able to give him ice chips twice, settled down. Restarted Dex at .4 rather than giving the PRN Fent.  MD responded: i think dex is a good idea  Part 2:  I s/w his dtr Debroah Baller (she has twin sisters) as I wanted to know his baseline.  His agitation/non cooperation is likely part hospital acquired delirium but equally if not more behavioral.  Dtr stated has 'undiagnosed mental health issues' and has been difficult/demanding/stubborn/defiant prior to these last hospitalizations.  While at rehab p/t admission here, she stated pt could communicate effectively as well as eat and feed himself and p/t his August hospitalization/surgery was totally independent.  She feels if he could hear her voice it would help - I encouraged her to video conf. & see if she could encourage him and explained if he wouldn't cooperate, we would have to put a feeding tube in for nutrition and meds.  At this point, I do not feel he will eat or take his medications, nor is it safe.  He would probably benefit from Seroquel or Klonopin once he is taking POs or has a tube.    MD responded would give some IV haldol for now.

## 2020-02-27 DIAGNOSIS — N186 End stage renal disease: Secondary | ICD-10-CM

## 2020-02-27 DIAGNOSIS — G062 Extradural and subdural abscess, unspecified: Secondary | ICD-10-CM | POA: Diagnosis not present

## 2020-02-27 DIAGNOSIS — G9341 Metabolic encephalopathy: Secondary | ICD-10-CM | POA: Diagnosis not present

## 2020-02-27 DIAGNOSIS — U071 COVID-19: Secondary | ICD-10-CM | POA: Diagnosis not present

## 2020-02-27 LAB — CBC
HCT: 20.2 % — ABNORMAL LOW (ref 39.0–52.0)
HCT: 19.9 % — ABNORMAL LOW (ref 39.0–52.0)
Hemoglobin: 6.6 g/dL — CL (ref 13.0–17.0)
Hemoglobin: 6.6 g/dL — CL (ref 13.0–17.0)
MCH: 25.7 pg — ABNORMAL LOW (ref 26.0–34.0)
MCH: 26.2 pg (ref 26.0–34.0)
MCHC: 32.7 g/dL (ref 30.0–36.0)
MCHC: 33.2 g/dL (ref 30.0–36.0)
MCV: 78.6 fL — ABNORMAL LOW (ref 80.0–100.0)
MCV: 79 fL — ABNORMAL LOW (ref 80.0–100.0)
Platelets: 110 10*3/uL — ABNORMAL LOW (ref 150–400)
Platelets: 117 10*3/uL — ABNORMAL LOW (ref 150–400)
RBC: 2.57 MIL/uL — ABNORMAL LOW (ref 4.22–5.81)
RBC: 2.52 MIL/uL — ABNORMAL LOW (ref 4.22–5.81)
RDW: 19 % — ABNORMAL HIGH (ref 11.5–15.5)
RDW: 18.8 % — ABNORMAL HIGH (ref 11.5–15.5)
WBC: 10.4 10*3/uL (ref 4.0–10.5)
WBC: 10.9 10*3/uL — ABNORMAL HIGH (ref 4.0–10.5)
nRBC: 2.7 % — ABNORMAL HIGH (ref 0.0–0.2)
nRBC: 2.9 % — ABNORMAL HIGH (ref 0.0–0.2)

## 2020-02-27 LAB — HEMOGLOBIN AND HEMATOCRIT, BLOOD
HCT: 21.6 % — ABNORMAL LOW (ref 39.0–52.0)
Hemoglobin: 7.1 g/dL — ABNORMAL LOW (ref 13.0–17.0)

## 2020-02-27 LAB — CULTURE, BLOOD (ROUTINE X 2)
Culture: NO GROWTH
Culture: NO GROWTH

## 2020-02-27 LAB — BASIC METABOLIC PANEL
Anion gap: 15 (ref 5–15)
BUN: 74 mg/dL — ABNORMAL HIGH (ref 8–23)
CO2: 22 mmol/L (ref 22–32)
Calcium: 7.5 mg/dL — ABNORMAL LOW (ref 8.9–10.3)
Chloride: 97 mmol/L — ABNORMAL LOW (ref 98–111)
Creatinine, Ser: 7.3 mg/dL — ABNORMAL HIGH (ref 0.61–1.24)
GFR calc Af Amer: 8 mL/min — ABNORMAL LOW (ref >60)
GFR calc non Af Amer: 7 mL/min — ABNORMAL LOW (ref >60)
Glucose, Bld: 94 mg/dL (ref 70–99)
Potassium: 3.3 mmol/L — ABNORMAL LOW (ref 3.5–5.1)
Sodium: 134 mmol/L — ABNORMAL LOW (ref 135–145)

## 2020-02-27 LAB — GLUCOSE, CAPILLARY
Glucose-Capillary: 112 mg/dL — ABNORMAL HIGH (ref 70–99)
Glucose-Capillary: 121 mg/dL — ABNORMAL HIGH (ref 70–99)
Glucose-Capillary: 143 mg/dL — ABNORMAL HIGH (ref 70–99)
Glucose-Capillary: 148 mg/dL — ABNORMAL HIGH (ref 70–99)
Glucose-Capillary: 36 mg/dL — CL (ref 70–99)
Glucose-Capillary: 43 mg/dL — CL (ref 70–99)
Glucose-Capillary: 46 mg/dL — ABNORMAL LOW (ref 70–99)
Glucose-Capillary: 67 mg/dL — ABNORMAL LOW (ref 70–99)
Glucose-Capillary: 78 mg/dL (ref 70–99)

## 2020-02-27 MED ORDER — DEXTROSE 50 % IV SOLN: 50.0000 mL | Freq: Once | INTRAVENOUS | Status: AC

## 2020-02-27 MED ORDER — CHLORHEXIDINE GLUCONATE 0.12 % MT SOLN
OROMUCOSAL | Status: AC
  Filled 2020-02-27: qty 15

## 2020-02-27 MED ORDER — DEXTROSE 50 % IV SOLN
50.0000 mL | Freq: Once | INTRAVENOUS | Status: AC
  Administered 2020-02-27: 50 mL via INTRAVENOUS

## 2020-02-27 MED ORDER — ORAL CARE MOUTH RINSE
15.0000 mL | Freq: Two times a day (BID) | OROMUCOSAL | Status: DC
  Administered 2020-02-28 – 2020-03-18 (×17): 15 mL via OROMUCOSAL

## 2020-02-27 MED ORDER — CHLORHEXIDINE GLUCONATE 0.12 % MT SOLN
15.0000 mL | Freq: Two times a day (BID) | OROMUCOSAL | Status: DC
  Administered 2020-02-27 – 2020-03-23 (×25): 15 mL via OROMUCOSAL
  Filled 2020-02-27 (×38): qty 15

## 2020-02-27 MED ORDER — CEFAZOLIN SODIUM-DEXTROSE 1-4 GM/50ML-% IV SOLN
1.0000 g | INTRAVENOUS | Status: AC
  Administered 2020-02-27 – 2020-03-02 (×5): 1 g via INTRAVENOUS
  Filled 2020-02-27 (×5): qty 50

## 2020-02-27 MED ORDER — DEXTROSE 10 % IV SOLN: INTRAVENOUS | Status: DC

## 2020-02-27 MED ORDER — DEXTROSE 50 % IV SOLN
INTRAVENOUS | Status: AC
  Administered 2020-02-27: 50 mL via INTRAVENOUS
  Filled 2020-02-27: qty 50

## 2020-02-27 MED ORDER — DEXTROSE 50 % IV SOLN
INTRAVENOUS | Status: AC
  Filled 2020-02-27: qty 50

## 2020-02-27 MED ORDER — DEXTROSE 50 % IV SOLN
INTRAVENOUS | Status: AC
  Administered 2020-02-27: 25 mL
  Filled 2020-02-27: qty 50

## 2020-02-27 MED ORDER — POTASSIUM CHLORIDE 20 MEQ PO PACK
20.0000 meq | PACK | Freq: Once | ORAL | Status: DC
  Filled 2020-02-27: qty 1

## 2020-02-27 NOTE — Progress Notes (Signed)
K+ 3.3 this AM - ok to give KCL 33meq x1 D3 zosyn for PNA>>resp cx came back with e.coli sens to cefazolin. Change to cefazolin to complete a total of 7d of therapy.   Ok to change for both per Dr. Earlie Raveling, PharmD, BCIDP, AAHIVP, CPP Infectious Disease Pharmacist 02/27/2020 2:34 PM

## 2020-02-27 NOTE — Progress Notes (Signed)
NAME:  [name]., MRN:  [id number], DOB:  [birth year], LOS: 7 ADMISSION DATE:  02/20/2020, CONSULTATION DATE: 02/23/2020 REFERRING MD: Rodney Patricia, MD, CHIEF COMPLAINT: Altered mental status/hypotension  Brief History   69 year old male with end-stage renal disease and liver cirrhosis from hep C, recently diagnosed with cervical epidural abscess, rapid response was called for altered mental status, shock and hypoxemia.  He was intubated and was transferred to ICU  Past Medical History  End-stage renal disease on hemodialysis Liver cirrhosis due to hepatitis C Polysubstance abuse Colon cancer status post hemicolectomy Cervical spine epidural abscess  Significant Hospital Events   9/22 RRT was called, patient was intubated and transferred to ICU  Consults:  PCCM Nephrology GI  Procedures:  ET tube 9/22 Left IJ 9/22 Right radial A-line 9/22  Significant Diagnostic Tests:  9/19 CT spine without contrast: Postoperative findings status post posterior laminectomy and fusion of C4 through T1, with moderate disc space height loss and osteophytosis of C6-C7 and C7-T1. Prevertebral soft tissue edema appears reduced compared to prior Examination. Postoperative fluid collection overlying laminectomy site does not appear significantly changed compared to prior examination. Assessment of the soft tissues and epidural space is generally limited on noncontrast CT and additionally limited by metallic streak artifact; contrast enhanced MRI is the test of choice for evaluation of edema, fluid collections, the epidural space, and discitis osteomyelitis if clinically suspected in the postoperative setting.  9/19 CT head without contrast: No acute intracranial abnormalities. Chronic white matter changes. Chronic right thalamic lacunar infarct.  9/21 MRI cervical spine: . 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 Possible involvement of the right C2-C3 facet joint  also associated erosion of bone since 01/28/2020 as demonstrated by CT two days ago. Resolved posterior epidural abscess following surgery last month. Satisfactorily decompressed levels C4-C5 through T1-T2. Improved thecal sac patency but mild residual degenerative spinal stenosis at both C2-C3 and C3-C4. No definite spinal cord signal abnormality.  Micro Data:  9/19 Covid positive 9/20 MRSA PCR negative 9/20 -9/21 blood cultures negative 9/24 respiratory culture>> Antimicrobials:  9/19  Vancomycin  >> 9/19 rifampicin (on hold due to deranged LFTs) 9/24 Zosyn  Interim history/subjective:  Weaned to room air overnight. Agitated delirium all night.   Objective   Blood pressure (!) 127/54, pulse (!) 49, temperature (!) 97.3 F (36.3 C), temperature source Axillary, resp. rate (!) 25, height 6' (1.829 m), weight 88.6 kg, SpO2 100 %.        Intake/Output Summary (Last 24 hours) at 02/27/2020 1117 Last data filed at 02/27/2020 1000 Gross per 24 hour  Intake 647.38 ml  Output 0 ml  Net 647.38 ml   Filed Weights   02/25/20 0915 02/26/20 0300 02/27/20 0446  Weight: 84.1 kg 88.4 kg 88.6 kg    Examination:   Physical exam: General: Chronically ill-appearing male,  HEAENT: [place]/AT, eyes anicteric.   Neuro: delirious, does not follow commands RASS +2 Chest: Fine crackles at the bases bilaterally, no wheezes or rhonchi Heart: Regular rate and rhythm, no murmurs or gallops Abdomen: Soft, nontender, nondistended, bowel sounds present Skin: No rash  Resolved Hospital Problem list   N/A  Assessment & Plan:   [name]. is a 69 y.o. 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 Will discontinue central line  Acute metabolic encephalopathy with delirium Multifactorial potentially from sepsis, covid, hyperglycemia. Needed precedex.  Delirium precautions  Shock liver in the setting of hep C cirrhosis and sepsis Continue to monitor LFTs Coagulopathy without signs of bleeding  End-stage renal disease on hemodialysis Last dialysis Friday, nephrology following  Poorly controlled Type 2 DM Hypoglycemia given loss of enteral access. Will place OG today and resume tube feeds and meds. He may need cortrack Monday.    Best practice:  Diet: NPO Pain/Anxiety/Delirium protocol (if indicated): dexmedetomidine VAP protocol (if indicated): Yes DVT prophylaxis: Subcu heparin GI prophylaxis: Protonix Glucose control: poorly controlled in the setting of sepsis. See above. Mobility: Bedrest Code Status: Full code Family Communication: called daughter [name] on 9/25 at 1:15pm and mailbox was full and unable to leave voicemail. Disposition: ICU. If able to maintain off precedex will consider transfer out of ICU.  Labs   CBC: Recent Labs  Lab 02/20/20 1315 02/21/20 0017 02/21/20 0434 02/21/20 0434 02/22/20 6295 02/22/20 0837 02/23/20 0404 02/23/20 0404 02/23/20 1531 02/23/20 1548 02/24/20 0330 02/24/20 0859 02/25/20 0308 02/25/20 0311 02/26/20 0540 02/27/20 0447 02/27/20 0550  WBC 4.7   < > 5.1   < > 5.1   < > 7.6   < > 15.7*  --  24.1*  --   --  10.4  --  10.4 10.9*  NEUTROABS 2.4  --  3.5  --  2.7  --  5.5  --   --   --  22.9*  --   --   --   --   --   --   HGB 8.5*   < > 8.0*   < > 7.6*   < > 8.6*   < > 7.8*   < > 8.7*   < > 9.2* 8.5* 7.1* 6.6* 6.6*  HCT 26.7*   < > 24.8*   < > 23.3*   < > 25.7*   < > 24.0*   < > 26.2*   < > 27.0* 25.6* 21.0* 20.2* 19.9*  MCV 81.7   < > 77.7*   < > 76.9*   < > 77.2*   < > 80.0  --  79.9*  --   --  77.1*  --  78.6* 79.0*  PLT 181   < > 201    < > 238   < > 309   < > 364  --  386  --   --  212  --  110* 117*   < > = values in this interval not displayed.    Basic Metabolic Panel: Recent Labs  Lab 02/23/20 0404 02/23/20 0404 02/23/20 1531 02/23/20 1548 02/24/20 0330 02/24/20 0859 02/24/20 1624 02/25/20 0308 02/25/20 0311 02/25/20 1718 02/26/20 0411 02/26/20 0540 02/27/20 0447  NA 135   < > 129*   < > 127*   < >  --  136 134*  --  131* 132* 134*  K 4.1   < > 3.6   < > 5.1   < >  --  4.0 4.1  --  3.9 3.9 3.3*  CL 98   < > 90*  --  92*  --   --   --  96*  --  94*  --  97*  CO2 18*   < > 15*  --  14*  --   --   --  21*  --  23  --  22  GLUCOSE 107*   < > 723*  --  698*  --   --   --  157*  --  320*  --  94  BUN 70*   < > 22  --  31*  --   --   --  52*  --  49*  --  74*  CREATININE 13.07*   < > 5.95*  --  7.22*  --   --   --  8.86*  --  5.84*  --  7.30*  CALCIUM 8.1*   < > 8.0*  --  8.2*  --   --   --  8.1*  --  7.3*  --  7.5*  MG 2.2   < > 1.6*  --  2.0  --  1.9  --  1.8 2.7*  --   --   --   PHOS 9.5*  --   --   --  6.9*  --  5.4*  --  4.8* 1.8*  --   --   --    < > = values in this interval not displayed.   GFR: Estimated Creatinine Clearance: 10.6 mL/min (A) (by C-G formula based on SCr of 7.3 mg/dL (H)). Recent Labs  Lab 02/20/20 1315 02/21/20 0017 02/21/20 0020 02/21/20 0434 02/21/20 0434 02/22/20 4742 02/23/20 0404 02/24/20 0330 02/25/20 0311 02/27/20 0447 02/27/20 0550  PROCALCITON 0.64  --   --  0.80  --  1.66  --   --   --   --   --   WBC 4.7   < >  --  5.1   < > 5.1   < > 24.1* 10.4 10.4 10.9*  LATICACIDVEN 1.1  --  1.8  --   --  2.3*  --   --   --   --   --    < > = values in this interval not displayed.    Liver Function Tests: Recent Labs  Lab 02/23/20 0404 02/23/20 1531 02/24/20 0330 02/25/20 0311 02/26/20 0411  AST 2,439* 2,574* 4,427* 1,974* 697*  ALT 654* 717* 1,074* 1,050* 625*  ALKPHOS 76 66 95 155* 134*  BILITOT 1.1 1.0 1.3* 1.6* 1.3*  PROT 7.9 6.2* 7.0 6.5 5.6*  ALBUMIN 2.6*  2.0* 2.6* 2.3* 1.7*   No results for input(s): LIPASE, AMYLASE in the last 168 hours. Recent Labs  Lab 02/21/20 0019 02/23/20 0700  AMMONIA 29 32    ABG    Component Value Date/Time   PHART 7.502 (H) 02/26/2020 0540   PCO2ART 30.7 (L) 02/26/2020 0540   PO2ART 94 02/26/2020 0540   HCO3 24.0 02/26/2020 0540   TCO2 25 02/26/2020 0540   ACIDBASEDEF 2.0 02/25/2020 0308   O2SAT 98.0 02/26/2020 0540     Coagulation Profile: Recent Labs  Lab 02/21/20 0434 02/25/20 0311 02/26/20 0411  INR 1.8* 2.0* 1.7*    Cardiac Enzymes: No results for input(s): CKTOTAL, CKMB, CKMBINDEX, TROPONINI in the last 168 hours.  HbA1C: Hemoglobin A1C  Date/Time Value Ref Range Status  12/08/2013 09:21 AM 9.6  Final  09/15/2013 11:00 AM >14.0  Final   Hgb A1c MFr Bld  Date/Time Value Ref Range Status  01/12/2020 04:03 AM 6.5 (H) 4.8 - 5.6 % Final    Comment:    (NOTE) Pre diabetes:          5.7%-6.4%  Diabetes:              >  6.4%  Glycemic control for   <7.0% adults with diabetes   03/02/2012 04:38 PM 6.2 (H) <5.7 % Final    Comment:    (NOTE)                                                                       According to the ADA Clinical Practice Recommendations for 2011, when HbA1c is used as a screening test:  >=6.5%   Diagnostic of Diabetes Mellitus           (if abnormal result is confirmed) 5.7-6.4%   Increased risk of developing Diabetes Mellitus References:Diagnosis and Classification of Diabetes Mellitus,Diabetes KMQK,8638,17(RNHAF 1):S62-S69 and Standards of Medical Care in         Diabetes - 2011,Diabetes BXUX,8333,83 (Suppl 1):S11-S61.    CBG: Recent Labs  Lab 02/26/20 2056 02/26/20 2336 02/27/20 0332 02/27/20 0829 02/27/20 0857  GLUCAP 109* 73 46* 36* 148*    The patient is critically ill with multiple organ systems failure and requires high complexity decision making for assessment and support, frequent evaluation and titration of therapies, application of  advanced monitoring technologies and extensive interpretation of multiple databases.   Critical Care Time devoted to patient care services described in this note is 42 minutes. This time reflects time of care of this [place] . This critical care time does not reflect separately billable procedures or procedure time, teaching time or supervisory time of PA/NP/Med student/Med Resident etc but could involve care discussion time.  Leone [facility] Pul[facility] Medicine 02/27/2020 11:17 AM  Pager: [phone] After hours pager: [phone]

## 2020-02-27 NOTE — Progress Notes (Signed)
[facility] Progress Note  Subjective:  Patient extubated yest. Is sedated on precedex due to agitation. Not waking up to voice this am.    Vitals:   02/27/20 0500 02/27/20 0600 02/27/20 0700 02/27/20 0830  BP:  (!) 128/54 (!) 124/51   Pulse: (!) 50 61 (!) 54   Resp: (!) 32 20 (!) 22   Temp:    (!) 97.3 F (36.3 C)  TempSrc:    Axillary  SpO2: 100% 97% 96%   Weight:      Height:        Exam:  on room air, SpO2 good, sedated on precedex  no jvd  Chest cta bilat ant/ lat  Cor reg no RG  Abd soft ntnd no ascites obese   Ext no LE edema   Neuro as above    R IJ TDC/  L arm AVF+bruit     CXR 9/23 - patchy perihilar/ basilar dz, no edema      OP HD: [facility] MWF   4h 27min  87kg  2/2 bath  TDC / L AVF  Hep none  -Mircera 150 mcg IV q 2 weeks (last dose 02/09/2020) -Venofer 100 mg IV X 8 doses (not started yet) -Calcitriol 1.5 mcg PO TIW  Assessment/ Plan: 1. COVID 19-recent diagnosis. Tested positive at [facility] 09/18 and again here.  Remdesivir per protocol. Fevers down. Per primary team.  2. Resp failure - extubated yest 9/25, on room air, last CXR no edema, mild patchy infiltrates.  3. AMS - agitation , on precedex gtt at this time 4. Recent epidural abscess/MRSA cervical discitis-  Sp decompression surgery 01/06/20. Continues on Vancomycin and Rifampin here through 10/01.  5.  ESRD - MWF HD. HD tomorrow. Hopefully we can do his HD upstairs if off the precedex tomorrow.  6.  HD access - L BC AVF placed Feb 2021. Was used starting in May and had infiltration in Aug prompting [facility] placement to rest the AVF. Using [facility] here.  7.  Hypertension/volume - 1kg up today, BP's wnl, no pressor support, euvolemic on exam. 0- 1 L off w/ hd tomorrow 8.  Anemia  - HGB 8.5. ESA due this week. Ordered darbe 100 q wed. Hold OP Fe load.  9.  Metabolic bone disease -Continue binders, VDRA 10.  Nutrition -NPO at present.  10.  DM-per primary   Kelly Splinter 02/27/2020, 9:01 AM   Recent  Labs  Lab 02/25/20 0311 02/25/20 0311 02/25/20 1718 02/26/20 0411 02/26/20 0540 02/26/20 0540 02/27/20 0447 02/27/20 0550  K 4.1   < >  --  3.9 3.9  --  3.3*  --   BUN 52*   < >  --  49*  --   --  74*  --   CREATININE 8.86*   < >  --  5.84*  --   --  7.30*  --   CALCIUM 8.1*   < >  --  7.3*  --   --  7.5*  --   PHOS 4.8*  --  1.8*  --   --   --   --   --   HGB 8.5*   < >  --   --  7.1*   < > 6.6* 6.6*   < > = values in this interval not displayed.   Inpatient medications: . calcitRIOL  1.5 mcg Per Tube Q M,W,F-HD  . Chlorhexidine Gluconate Cloth  6 each Topical Q0600  . darbepoetin (ARANESP) injection -  DIALYSIS  100 mcg Intravenous Q Wed-HD  . feeding supplement (PROSource TF)  90 mL Per Tube TID  . heparin  5,000 Units Subcutaneous Q8H  . hydrocortisone sod succinate (SOLU-CORTEF) inj  100 mg Intravenous Once  . hydrocortisone sod succinate (SOLU-CORTEF) inj  50 mg Intravenous Q12H  . [START ON 02/28/2020] hydrocortisone sod succinate (SOLU-CORTEF) inj  50 mg Intravenous Daily  . midodrine  20 mg Per Tube TID  . multivitamin  1 tablet Per Tube QHS  . pantoprazole sodium  40 mg Per Tube Daily  . sevelamer carbonate  1,600 mg Per Tube TID WC  . zinc sulfate  220 mg Per Tube Daily   . sodium chloride    . sodium chloride    . sodium chloride Stopped (02/21/20 2227)  . dexmedetomidine (PRECEDEX) IV infusion 0.4 mcg/kg/hr (02/27/20 0800)  . dextrose 5 % and 0.9% NaCl 20 mL/hr at 02/27/20 0800  . feeding supplement (VITAL 1.5 CAL) Stopped (02/26/20 1016)  . piperacillin-tazobactam (ZOSYN)  IV Stopped (02/27/20 0215)  . vancomycin 1,000 mg (02/25/20 1157)   sodium chloride, sodium chloride, sodium chloride, alteplase, fentaNYL (SUBLIMAZE) injection, haloperidol lactate, heparin, lidocaine (PF), lidocaine-prilocaine, ondansetron **OR** ondansetron (ZOFRAN) IV, pentafluoroprop-tetrafluoroeth

## 2020-02-27 NOTE — Progress Notes (Signed)
Hypoglycemic Event  CBG: 67  Treatment: D50 25 mL (12.5 gm)  Symptoms: None  Follow-up CBG: Time: 2033       CBG Result  121  Possible Reasons for Event: Inadequate meal intake     Charmayne Sheer

## 2020-02-27 NOTE — Progress Notes (Signed)
[facility] Progress Note Patient Name: [name]. DOB: Jul 29, 1950 MRN: [id number]   Date of Service  02/27/2020  HPI/Events of Note  Notified of Hgb 6.6 No report of active bleed  eICU Interventions  Ordered to transfuse 1 unit PRBC     Intervention Category Major Interventions: Hemorrhage - evaluation and management  Shona Needles Ennio Houp 02/27/2020, 7:01 AM

## 2020-02-27 NOTE — Progress Notes (Signed)
Critical lab of Hgb 6.6 called to this Probation officer, MD called to notify.

## 2020-02-28 DIAGNOSIS — D638 Anemia in other chronic diseases classified elsewhere: Secondary | ICD-10-CM | POA: Diagnosis not present

## 2020-02-28 DIAGNOSIS — G9341 Metabolic encephalopathy: Secondary | ICD-10-CM | POA: Diagnosis not present

## 2020-02-28 DIAGNOSIS — U071 COVID-19: Secondary | ICD-10-CM | POA: Diagnosis not present

## 2020-02-28 DIAGNOSIS — G062 Extradural and subdural abscess, unspecified: Secondary | ICD-10-CM | POA: Diagnosis not present

## 2020-02-28 LAB — GLUCOSE, CAPILLARY
Glucose-Capillary: 18 mg/dL — CL (ref 70–99)
Glucose-Capillary: <10 mg/dL — CL (ref 70–99)
Glucose-Capillary: 11 mg/dL — CL (ref 70–99)
Glucose-Capillary: <10 mg/dL — CL (ref 70–99)
Glucose-Capillary: >600 mg/dL (ref 70–99)
Glucose-Capillary: 140 mg/dL — ABNORMAL HIGH (ref 70–99)
Glucose-Capillary: 144 mg/dL — ABNORMAL HIGH (ref 70–99)
Glucose-Capillary: 96 mg/dL (ref 70–99)
Glucose-Capillary: 151 mg/dL — ABNORMAL HIGH (ref 70–99)
Glucose-Capillary: 220 mg/dL — ABNORMAL HIGH (ref 70–99)
Glucose-Capillary: 351 mg/dL — ABNORMAL HIGH (ref 70–99)

## 2020-02-28 LAB — CBC
HCT: 23.4 % — ABNORMAL LOW (ref 39.0–52.0)
Hemoglobin: 7.6 g/dL — ABNORMAL LOW (ref 13.0–17.0)
MCH: 25.5 pg — ABNORMAL LOW (ref 26.0–34.0)
MCHC: 32.5 g/dL (ref 30.0–36.0)
MCV: 78.5 fL — ABNORMAL LOW (ref 80.0–100.0)
Platelets: 108 10*3/uL — ABNORMAL LOW (ref 150–400)
RBC: 2.98 MIL/uL — ABNORMAL LOW (ref 4.22–5.81)
RDW: 20.1 % — ABNORMAL HIGH (ref 11.5–15.5)
WBC: 11.2 10*3/uL — ABNORMAL HIGH (ref 4.0–10.5)
nRBC: 1.3 % — ABNORMAL HIGH (ref 0.0–0.2)

## 2020-02-28 LAB — HEPATIC FUNCTION PANEL
ALT: 251 U/L — ABNORMAL HIGH (ref 0–44)
AST: 176 U/L — ABNORMAL HIGH (ref 15–41)
Albumin: 1.6 g/dL — ABNORMAL LOW (ref 3.5–5.0)
Alkaline Phosphatase: 104 U/L (ref 38–126)
Bilirubin, Direct: 0.6 mg/dL — ABNORMAL HIGH (ref 0.0–0.2)
Indirect Bilirubin: 0.8 mg/dL (ref 0.3–0.9)
Total Bilirubin: 1.4 mg/dL — ABNORMAL HIGH (ref 0.3–1.2)
Total Protein: 5.3 g/dL — ABNORMAL LOW (ref 6.5–8.1)

## 2020-02-28 LAB — RENAL FUNCTION PANEL
Albumin: 1.5 g/dL — ABNORMAL LOW (ref 3.5–5.0)
Anion gap: 18 — ABNORMAL HIGH (ref 5–15)
BUN: 90 mg/dL — ABNORMAL HIGH (ref 8–23)
CO2: 19 mmol/L — ABNORMAL LOW (ref 22–32)
Calcium: 7.6 mg/dL — ABNORMAL LOW (ref 8.9–10.3)
Chloride: 97 mmol/L — ABNORMAL LOW (ref 98–111)
Creatinine, Ser: 8.83 mg/dL — ABNORMAL HIGH (ref 0.61–1.24)
GFR calc Af Amer: 6 mL/min — ABNORMAL LOW (ref >60)
GFR calc non Af Amer: 6 mL/min — ABNORMAL LOW (ref >60)
Glucose, Bld: 175 mg/dL — ABNORMAL HIGH (ref 70–99)
Phosphorus: 2.9 mg/dL (ref 2.5–4.6)
Potassium: 3.6 mmol/L (ref 3.5–5.1)
Sodium: 134 mmol/L — ABNORMAL LOW (ref 135–145)

## 2020-02-28 LAB — PATHOLOGIST SMEAR REVIEW

## 2020-02-28 MED ORDER — PROSOURCE TF PO LIQD: 45.0000 mL | Freq: Two times a day (BID) | ORAL | Status: DC

## 2020-02-28 MED ORDER — VITAL 1.5 CAL PO LIQD
1000.0000 mL | ORAL | Status: DC
  Administered 2020-02-29 – 2020-03-01 (×3): 1000 mL
  Filled 2020-02-28 (×2): qty 1000

## 2020-02-28 MED ORDER — CALCITRIOL 1 MCG/ML PO SOLN
1.5000 ug | ORAL | Status: DC
  Administered 2020-02-28 – 2020-03-03 (×3): 1.5 ug
  Filled 2020-02-28 (×4): qty 1.5

## 2020-02-28 MED ORDER — QUETIAPINE FUMARATE 25 MG PO TABS
25.0000 mg | ORAL_TABLET | Freq: Every day | ORAL | Status: DC
  Administered 2020-02-28 – 2020-02-29 (×2): 25 mg via ORAL
  Filled 2020-02-28 (×2): qty 1

## 2020-02-28 MED ORDER — PROSOURCE TF PO LIQD
90.0000 mL | Freq: Two times a day (BID) | ORAL | Status: DC
  Administered 2020-02-28 – 2020-03-02 (×7): 90 mL
  Filled 2020-02-28 (×9): qty 90

## 2020-02-28 MED ORDER — VANCOMYCIN HCL IN DEXTROSE 1-5 GM/200ML-% IV SOLN
INTRAVENOUS | Status: AC
  Administered 2020-02-28: 1000 mg via INTRAVENOUS
  Filled 2020-02-28: qty 200

## 2020-02-28 MED ORDER — HEPARIN SODIUM (PORCINE) 1000 UNIT/ML IJ SOLN
INTRAMUSCULAR | Status: AC
  Administered 2020-02-28: 3800 [IU] via INTRAVENOUS_CENTRAL
  Filled 2020-02-28: qty 4

## 2020-02-28 MED ORDER — VITAL HIGH PROTEIN PO LIQD: 1000.0000 mL | ORAL | Status: DC

## 2020-02-28 MED ORDER — INSULIN ASPART 100 UNIT/ML ~~LOC~~ SOLN
0.0000 [IU] | SUBCUTANEOUS | Status: DC
  Administered 2020-02-28: 5 [IU] via SUBCUTANEOUS
  Administered 2020-02-28: 2 [IU] via SUBCUTANEOUS
  Administered 2020-02-29 (×2): 5 [IU] via SUBCUTANEOUS

## 2020-02-28 NOTE — Procedures (Signed)
Cortrak  Person Inserting Tube:  Ethleen Lormand, RD Tube Type:  Cortrak - 43 inches Tube Location:  Left nare Initial Placement:  Stomach Secured by: Bridle Technique Used to Measure Tube Placement:  Documented cm marking at nare/ corner of mouth Cortrak Secured At:  63 cm   No x-ray is required. RN may begin using tube.   If the tube becomes dislodged please keep the tube and contact the Cortrak team at [email or web address] (password [email or web address]) for replacement.  If after hours and replacement cannot be delayed, place a NG tube and confirm placement with an abdominal x-ray.    Airyn Ellzey RD, LDN Clinical Nutrition Pager listed in AMION   

## 2020-02-28 NOTE — Progress Notes (Signed)
Pharmacy Antibiotic Note  Correll Apelles Jester Klingberg. is a 69 y.o. male admitted on 02/20/2020 with epidural abscess on IV vancomycin PTA for MRSA bacteremia with epidural abscess s/p spinal fusion.  Pharmacy has been consulted to resume vancomycin dosing. WBC wnl. Vancomycin is currently scheduled to be administered MWF with HD. Stop date 10/1  Plan: - Continue vancomycin 1 gm IV Q. Currently in HD with plans to receive vancomycin dose 9/24.  -Monitor clinical progress and cultures - Continue to monitor HD schedule  Height: 6' (182.9 cm) Weight:  (on bair hugger) IBW/kg (Calculated) : 77.6  Temp (24hrs), Avg:95.7 F (35.4 C), Min:93.8 F (34.3 C), Max:97.6 F (36.4 C)  Recent Labs  Lab 02/22/20 0837 02/23/20 0404 02/24/20 0330 02/25/20 0311 02/26/20 0411 02/27/20 0447 02/27/20 0550 02/28/20 0426 02/28/20 0633  WBC 5.1   < > 24.1* 10.4  --  10.4 10.9* 11.2*  --   CREATININE 10.29*   < > 7.22* 8.86* 5.84* 7.30*  --   --  8.83*  LATICACIDVEN 2.3*  --   --   --   --   --   --   --   --   VANCORANDOM  --   --   --  25  --   --   --   --   --    < > = values in this interval not displayed.    Estimated Creatinine Clearance: 8.8 mL/min (A) (by C-G formula based on SCr of 8.83 mg/dL (H)).    No Known Allergies  Antimicrobials this admission: Vanc 9/19>>  Dose adjustments this admission: NA  Microbiology results: 9/21 BCx>>ngtd 9/20 BCx>>ngtd  Thank you for allowing pharmacy to be a part of this patient's care.  Alanda Slim, PharmD, [facility] Clinical Pharmacist Please see AMION for all Pharmacists' Contact Phone Numbers 02/28/2020, 9:58 AM

## 2020-02-28 NOTE — Progress Notes (Signed)
[facility] Progress Note  Subjective:   Extubated on 9/25 per nursing report.  Has been on room air but agitated  Review of systems:  Doesn't answer questions but interactive  Vitals:   02/28/20 0300 02/28/20 0400 02/28/20 0500 02/28/20 0504  BP: 120/73 (!) 138/101 (!) 133/109   Pulse:      Resp: 20 18 (!) 21   Temp:    (!) 94.3 F (34.6 C)  TempSrc:    Rectal  SpO2:  97%    Weight:      Height:        Exam:  adult male in bed in NAD  NCAT  Chest cta bilat ant/ lat; room air unlabored  S1S2 no rub   Abd soft nt obese habitus   Ext no LE edema   Neuro as above    R IJ TDC/  L arm AVF+bruit     CXR 9/23 - patchy perihilar/ basilar dz, no edema      OP HD: [facility] MWF   4h 37min  87kg  2/2 bath  TDC / L AVF  Hep none  -Mircera 150 mcg IV q 2 weeks (last dose 02/09/2020) -Venofer 100 mg IV X 8 doses (not started yet) -Calcitriol 1.5 mcg PO TIW  Assessment/ Plan: 1. COVID 19-recent diagnosis. Tested positive at [facility] 09/18 and again here.  Per primary team. 2. Resp failure - extubated and on room air  3. AMS - agitation with mittens; per primary  4. Recent epidural abscess/MRSA cervical discitis-  Sp decompression surgery 01/06/20. on Vancomycin and Rifampin here through 10/01 per charting (rifampin now off and on cefazolin - abx per primary team discretion).  5.  ESRD - MWF HD 6.  HD access - L BC AVF placed Feb 2021. Was used starting in May and had infiltration in Aug prompting [facility] placement to rest the AVF. Using [facility] here.  7.  Hypertension/volume - optimize volume with HD. Note midodrine.  8. Anemia  - aranesp 100 q wed. Hold OP Fe load.  9.  Metabolic bone disease -Continue binders, VDRA 10.  DM-per primary    Recent Labs  Lab 02/25/20 0311 02/25/20 0311 02/25/20 1718 02/26/20 0411 02/26/20 0540 02/26/20 0540 02/27/20 0447 02/27/20 0550 02/27/20 2335 02/28/20 0426  K 4.1   < >  --  3.9 3.9  --  3.3*  --   --   --   BUN 52*   < >  --  49*   --   --  74*  --   --   --   CREATININE 8.86*   < >  --  5.84*  --   --  7.30*  --   --   --   CALCIUM 8.1*   < >  --  7.3*  --   --  7.5*  --   --   --   PHOS 4.8*  --  1.8*  --   --   --   --   --   --   --   HGB 8.5*   < >  --   --  7.1*   < > 6.6*   < > 7.1* 7.6*   < > = values in this interval not displayed.   Inpatient medications: . heparin sodium (porcine)      . calcitRIOL  1.5 mcg Per Tube Q M,W,F-HD  . chlorhexidine  15 mL Mouth Rinse BID  . Chlorhexidine Gluconate Cloth  6  each Topical [id number]  . darbepoetin (ARANESP) injection - DIALYSIS  100 mcg Intravenous Q Wed-HD  . feeding supplement (PROSource TF)  90 mL Per Tube TID  . heparin  5,000 Units Subcutaneous Q8H  . hydrocortisone sod succinate (SOLU-CORTEF) inj  100 mg Intravenous Once  . hydrocortisone sod succinate (SOLU-CORTEF) inj  50 mg Intravenous Daily  . mouth rinse  15 mL Mouth Rinse q12n4p  . midodrine  20 mg Per Tube TID  . multivitamin  1 tablet Per Tube QHS  . pantoprazole sodium  40 mg Per Tube Daily  . potassium chloride  20 mEq Per Tube Once  . sevelamer carbonate  1,600 mg Per Tube TID WC  . zinc sulfate  220 mg Per Tube Daily   . vancomycin    . sodium chloride    . sodium chloride    . sodium chloride Stopped (02/21/20 2227)  .  ceFAZolin (ANCEF) IV Stopped (02/27/20 1631)  . dexmedetomidine (PRECEDEX) IV infusion 0.4 mcg/kg/hr (02/28/20 0500)  . dextrose 20 mL/hr at 02/28/20 0500  . feeding supplement (VITAL 1.5 CAL) Stopped (02/26/20 1016)  . vancomycin 1,000 mg (02/25/20 1157)   sodium chloride, sodium chloride, sodium chloride, alteplase, fentaNYL (SUBLIMAZE) injection, haloperidol lactate, heparin, lidocaine (PF), lidocaine-prilocaine, ondansetron **OR** ondansetron (ZOFRAN) IV, pentafluoroprop-tetrafluoroeth   Claudia Desanctis, MD 02/28/2020  6:35 AM

## 2020-02-28 NOTE — Progress Notes (Signed)
[facility] Progress Note Patient Name: [name]. DOB: 11/22/1950 MRN: [id number]   Date of Service  02/28/2020  HPI/Events of Note  Hyperglycemia - Blood glucose = 220. Creatinine = 8.83.   eICU Interventions  Plan: 1. Q 4 hour very sensitive Novolog SSI.      Intervention Category Major Interventions: Hyperglycemia - active titration of insulin therapy  Lysle Dingwall 02/28/2020, 8:37 PM

## 2020-02-28 NOTE — Progress Notes (Signed)
NAME:  [name]., MRN:  [id number], DOB:  1951/02/26, LOS: 8 ADMISSION DATE:  02/20/2020, CONSULTATION DATE: 02/23/2020 REFERRING MD: Albertine Patricia, MD, CHIEF COMPLAINT: Altered mental status/hypotension  Brief History   69 year old male with end-stage renal disease and liver cirrhosis from hep C, recently diagnosed with cervical epidural abscess, rapid response was called for altered mental status, shock and hypoxemia.  He was intubated and was transferred to ICU  Extubated 9/25  Past Medical History  End-stage renal disease on hemodialysis Liver cirrhosis due to hepatitis C Polysubstance abuse Colon cancer status post hemicolectomy Cervical spine epidural abscess  Significant Hospital Events   9/22 RRT was called, patient was intubated and transferred to ICU  Consults:  PCCM Nephrology GI  Procedures:  ET tube 9/22 > 9/25 Left IJ 9/22 >> 9/ Right radial A-line 9/22 > 9/25  Significant Diagnostic Tests:  9/19 CT spine without contrast > Postoperative findings status post posterior laminectomy and fusion of C4 through T1, with moderate disc space height loss and osteophytosis of C6-C7 and C7-T1. Prevertebral soft tissue edema appears reduced compared to prior examination. Postoperative fluid collection overlying laminectomy site does not appear significantly changed compared to prior examination. Assessment of the soft tissues and epidural space is generally limited on noncontrast CT and additionally limited by metallic streak/ artifact; contrast enhanced MRI is the test of choice for evaluation of edema, fluid collections, the epidural space, and discitis osteomyelitis if clinically suspected in the postoperative setting.  9/19 CT head without contrast > No acute intracranial abnormalities. Chronic white matter changes. Chronic right t/halamic lacunar infarct.  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 Possible  involvement of the right C2-C3 facet joint also associated erosion of bone since 01/28/2020 as demonstrated by CT two days ago. Resolved posterior epidural abscess following surgery last month. Satisfactorily decompressed levels C4-C5 through T1-T2. Improved thecal sac patency but mild residual degenerative spinal stenosis at both C2-C3 and C3-C4. No definite spinal cord signal abnormality.  Micro Data:  9/19 Covid > positive 9/20 MRSA PCR > negative 9/20 and  9/21 blood cultures > negative 9/24 respiratory culture >E.Coli resistant to Ampicillin   Antimicrobials:  9/19  Vancomycin  >> 9/19 rifampicin (on hold due to deranged LFTs) 9/24 Zosyn x1 9/26 Cefazolin >   Interim history/subjective:  Lying in bed on iHD, not removing volume currently due to hypotension  No acute issues overnight  Precedex stopped AM of 9/27  Objective   Blood pressure (!) 63/39, pulse 85, temperature (!) 93.8 F (34.3 C), temperature source Axillary, resp. rate (!) 29, height 6' (1.829 m), weight 87.9 kg, SpO2 95 %.        Intake/Output Summary (Last 24 hours) at 02/28/2020 0856 Last data filed at 02/28/2020 0800 Gross per 24 hour  Intake 753.22 ml  Output --  Net 753.22 ml   Filed Weights   02/26/20 0300 02/27/20 0446 02/28/20 0500  Weight: 88.4 kg 88.6 kg 87.9 kg    Examination: General: Chronically ill appearing elderly male lying in bed on [place] in NAD HEENT: [place]/AT, MM pink/moist, PERRL,  Neuro: Alert to self only, non-focal  CV: s1s2 regular rate and rhythm, no murmur, rubs, or gallops,  PULM:  Clear to ascultation bilaterally, no increased work of breathing no added breath sounds GI: soft, bowel sounds active in all 4 quadrants, non-tender, non-distended Extremities: warm/dry, no edema  Skin: no rashes or lesions  Resolved Hospital Problem list  N/A  Assessment & Plan:   Acute hypoxic respiratory failure due to COVID-19 pneumonia -Improved, now on room air/2L nasal  cannula HCAP -Respiratory culture positive for E.Coli 9/24 P: Continue pulmonary hygiene  Mobilize as able  Continue IV vancomycin and Cefazolin   Septic shock due to 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 twice daily to complete 8-week course per infectious disease through March 03, 2020 P:  Continue IV vancomycin and Cefazolin  Rifampicin placed on hold due to transaminates, this will not be resumed  Continue to wean dose steroids  Discontinue CVC  Acute metabolic encephalopathy with delirium -Multifactorial potentially from sepsis, covid, hyperglycemia. P: Wean precedex off Delirium precautions  Mobilize as able   Shock liver in the setting of hep C cirrhosis and sepsis -AST peaked at 4,427 and ALT peaked at 1,074.  P; LFTs improving  Intermittently trend LFT Avoid hepatotoxins   End-stage renal disease on hemodialysis P: Nephrology following Follow renal function / urine output Trend Bmet Avoid nephrotoxins Ensure adequate renal perfusion   Poorly controlled Type 2 DM -Hypoglycemia given loss of enteral access.  P: Need Cortrak Wean D10 as able   Goals of care  Palliative care has been consulted and met with family over the phone at 9/23 and decision was made to remain full code at that time.  Palliative care will continue to work with family in collaboration with primary team to determine appropriate goals of care for patient moving forward  Best practice:  Diet: NPO Pain/Anxiety/Delirium protocol (if indicated): dexmedetomidine VAP protocol (if indicated): Yes DVT prophylaxis: Subcu heparin GI prophylaxis: Protonix Glucose control: poorly controlled in the setting of sepsis. See above. Mobility: Bedrest Code Status: Full code Family Communication: Will  attempt to update family again 9/27  Disposition: ICU. If able to maintain off precedex will consider transfer out of ICU 9/27  Labs   CBC: Recent Labs  Lab 02/22/20 0837 02/22/20 0837 02/23/20 0404 02/23/20 1531 02/24/20 0330 02/24/20 0859 02/25/20 0311 02/25/20 0311 02/26/20 0540 02/27/20 0447 02/27/20 0550 02/27/20 2335 02/28/20 0426  WBC 5.1   < > 7.6   < > 24.1*  --  10.4  --   --  10.4 10.9*  --  11.2*  NEUTROABS 2.7  --  5.5  --  22.9*  --   --   --   --   --   --   --   --   HGB 7.6*   < > 8.6*   < > 8.7*   < > 8.5*   < > 7.1* 6.6* 6.6* 7.1* 7.6*  HCT 23.3*   < > 25.7*   < > 26.2*   < > 25.6*   < > 21.0* 20.2* 19.9* 21.6* 23.4*  MCV 76.9*   < > 77.2*   < > 79.9*  --  77.1*  --   --  78.6* 79.0*  --  78.5*  PLT 238   < > 309   < > 386  --  212  --   --  110* 117*  --  108*   < > = values in this interval not displayed.    Basic Metabolic Panel: Recent Labs  Lab 02/23/20 1531 02/23/20 1548 02/24/20 0330 02/24/20 3329 02/24/20 1624 02/25/20 0308 02/25/20 5188 02/25/20 1718 02/26/20 0411 02/26/20 0540 02/27/20 4166 02/28/20 0630  NA 129*   < > 127*   < >  --    < > 134*  --  131* 132* 134* 134*  K 3.6   < > 5.1   < >  --    < > 4.1  --  3.9 3.9 3.3* 3.6  CL 90*   < > 92*  --   --   --  96*  --  94*  --  97* 97*  CO2 15*   < > 14*  --   --   --  21*  --  23  --  22 19*  GLUCOSE 723*   < > 698*  --   --   --  157*  --  320*  --  94 175*  BUN 22   < > 31*  --   --   --  52*  --  49*  --  74* 90*  CREATININE 5.95*   < > 7.22*  --   --   --  8.86*  --  5.84*  --  7.30* 8.83*  CALCIUM 8.0*   < > 8.2*  --   --   --  8.1*  --  7.3*  --  7.5* 7.6*  MG 1.6*  --  2.0  --  1.9  --  1.8 2.7*  --   --   --   --   PHOS  --   --  6.9*  --  5.4*  --  4.8* 1.8*  --   --   --  2.9   < > = values in this interval not displayed.   GFR: Estimated Creatinine Clearance: 8.8 mL/min (A) (by C-G formula based on SCr of 8.83 mg/dL (H)). Recent Labs  Lab 02/22/20 0837  02/23/20 0404 02/25/20 0311 02/27/20 0447 02/27/20 0550 02/28/20 0426  PROCALCITON 1.66  --   --   --   --   --   WBC 5.1   < > 10.4 10.4 10.9* 11.2*  LATICACIDVEN 2.3*  --   --   --   --   --    < > = values in this interval not displayed.    Liver Function Tests: Recent Labs  Lab 02/23/20 1531 02/23/20 1531 02/24/20 0330 02/25/20 0311 02/26/20 0411 02/28/20 0426 02/28/20 0633  AST 2,574*  --  4,427* 1,974* 697* 176*  --   ALT 717*  --  1,074* 1,050* 625* 251*  --   ALKPHOS 66  --  95 155* 134* 104  --   BILITOT 1.0  --  1.3* 1.6* 1.3* 1.4*  --   PROT 6.2*  --  7.0 6.5 5.6* 5.3*  --   ALBUMIN 2.0*   < > 2.6* 2.3* 1.7* 1.6* 1.5*   < > = values in this interval not displayed.   No results for input(s): LIPASE, AMYLASE in the last 168 hours. Recent Labs  Lab 02/23/20 0700  AMMONIA 32    ABG    Component Value Date/Time   PHART 7.502 (H) 02/26/2020 0540   PCO2ART 30.7 (L) 02/26/2020 0540   PO2ART 94 02/26/2020 0540   HCO3 24.0 02/26/2020 0540   TCO2 25 02/26/2020 0540   ACIDBASEDEF 2.0 02/25/2020 0308   O2SAT 98.0 02/26/2020 0540     Coagulation Profile: Recent Labs  Lab 02/25/20 0311 02/26/20 0411  INR 2.0* 1.7*    Cardiac Enzymes: No results for input(s): CKTOTAL, CKMB, CKMBINDEX, TROPONINI in the last 168 hours.  HbA1C: Hemoglobin A1C  Date/Time Value Ref Range Status  12/08/2013 09:21 AM 9.6  Final  09/15/2013 11:00 AM >14.0  Final   Hgb A1c MFr Bld  Date/Time Value Ref Range Status  01/12/2020 04:03 AM 6.5 (H) 4.8 - 5.6 % Final    Comment:    (NOTE) Pre diabetes:          5.7%-6.4%  Diabetes:              >6.4%  Glycemic control for   <7.0% adults with diabetes   03/02/2012 04:38 PM 6.2 (H) <5.7 % Final    Comment:    (NOTE)                                                                       According to the ADA Clinical Practice Recommendations for 2011, when HbA1c is used as a screening test:  >=6.5%   Diagnostic of Diabetes  [place]           (if abnormal result is confirmed) 5.7-6.4%   Increased risk of developing Diabetes Mellitus References:Diagnosis and Classification of Diabetes Mellitus,Diabetes BULA,4536,46(OEHOZ 1):S62-S69 and Standards of Medical Care in         Diabetes - 2011,Diabetes Care,2011,34 (Suppl 1):S11-S61.    CBG: Recent Labs  Lab 02/27/20 1941 02/27/20 2033 02/27/20 2339 02/28/20 0354 02/28/20 0741  GLUCAP 67* 121* 112* 140* 144*   CRITICAL CARE Performed by: Johnsie Cancel  Total critical care time: 38 minutes  Critical care time was exclusive of separately billable procedures and treating other patients.  Critical care was necessary to treat or prevent imminent or life-threatening deterioration.  Critical care was time spent personally by me on the following activities: development of treatment plan with patient and/or surrogate as well as nursing, discussions with consultants, evaluation of patient's response to treatment, examination of patient, obtaining history from patient or surrogate, ordering and performing treatments and interventions, ordering and review of laboratory studies, ordering and review of radiographic studies, pulse oximetry and re-evaluation of patient's condition.  Johnsie Cancel, NP-C [facility] Contact / Pager information can be found on Amion  02/28/2020, 9:18 AM

## 2020-02-28 NOTE — Progress Notes (Signed)
Nutrition Follow-up  DOCUMENTATION CODES:   Non-severe (moderate) malnutrition in context of chronic illness  INTERVENTION:   Tube Feeding via cortrak: Vital 1.5 at 55 ml/hr (1320 ml/day) Pro-Source 90 ml BID  Provides: 2140 kcal, 133 grams protein, and 1003 ml free water.   Rena-vite daily   NUTRITION DIAGNOSIS:   Moderate Malnutrition related to chronic illness (ESRD on HD; now COVID) as evidenced by moderate fat depletion, moderate muscle depletion.  Ongoing.   GOAL:   Patient will meet greater than or equal to 90% of their needs  Met.   MONITOR:   TF tolerance, Diet advancement  REASON FOR ASSESSMENT:   Consult Enteral/tube feeding initiation and management  ASSESSMENT:   69 yo male admitted with acute metabolic encephalopathy likely in setting of COVID-19 infection, developed respiratory failure and septic shock requiring transfer to ICU and intubation. PMH includes ESRD on HD, liver cirrhosis, colon cancer s/p hemicolectomy, polysubstance abuse  Pt sleepy and confused during exam. Unable to answer any questions.  Pt had iHD today but per MD unable to pull fluid due to hypotension.  BP: 74/46; 86/47; 89/78; 81/53   9/18 COVID+ dx 9/19 Admission 9/22 Agonal breathing post iHD, hypoglycemic, hypotensive, lethargic; transferred to ICU,  Intubated 9/25 extubated 9/27 precedex stopped; cortrak placed with tip gastric    Medications reviewed and include: solucortef, Rena-vit, renvela, zinc sulfate Labs reviewed: Na 134, BUN: 90, Cr: 8.83, K+ and PO4 WNL  CBG's: 140-144-96  I&O: positive 10 L UF: 783 ml  NUTRITION - FOCUSED PHYSICAL EXAM:    Most Recent Value  Orbital Region Moderate depletion  Upper Arm Region Moderate depletion  Thoracic and Lumbar Region Moderate depletion  Buccal Region Moderate depletion  Temple Region Moderate depletion  Clavicle Bone Region Moderate depletion  Clavicle and Acromion Bone Region Moderate depletion  Scapular  Bone Region Moderate depletion  Dorsal Hand Moderate depletion  Patellar Region Severe depletion  Anterior Thigh Region Severe depletion  Posterior Calf Region Moderate depletion  Edema (RD Assessment) Mild  Hair Reviewed  Eyes Reviewed  Mouth Reviewed  Skin Reviewed  Nails Reviewed       Diet Order:   Diet Order            Diet NPO time specified  Diet effective now                 EDUCATION NEEDS:   Not appropriate for education at this time  Skin:  Skin Assessment: Reviewed RN Assessment  Last BM:  9/27 small type 7 via rectal tube  Height:   Ht Readings from Last 1 Encounters:  02/21/20 6' (1.829 m)    Weight:   Wt Readings from Last 1 Encounters:  02/28/20 88.2 kg    Ideal Body Weight:     BMI:  Body mass index is 26.37 kg/m.  Estimated Nutritional Needs:   Kcal:  2050-2260 kcals  Protein:  130-150 g  Fluid:  1000 mL plus UOP  Donzel Romack P., RD, LDN, CNSC See AMiON for contact information

## 2020-02-28 NOTE — Progress Notes (Signed)
Maurine Minister is aware of the central line discontinue order and will remove line.  Delilah Shan Bentli Llorente,RN-VAST

## 2020-02-28 NOTE — Progress Notes (Signed)
Occupational Therapy Treatment Patient Details Name: [name]. MRN: [id number] DOB: Jan 22, 1951 Today's Date: 02/28/2020    History of present illness Pt is a 69 y.o. male with medical history significant of hypertension, hyperlipidemia ESRD on hemodialysis on Monday Wednesday, Friday, type 2 diabetes mellitus, anemia of chronic disease, colon cancer status post resection, epidural abscess status post laminectomy, fusion on 01/06/2020 and MRSA bacteremia presents to emergency department for evaluation of confusion and COVID-19 positive.  He is admitted with metabolic encephalopathy and COVID 19.   OT comments  Pt seen for OT follow up session with focus on ADL mobility progression. Noted per chart review, pt had been intubated and recently extubated. He is able to tolerate 2L [place] with O2 stable this session. Pt completed bed mobility with max A +2 and able to progress to standing with max A +2 for bed linen change. Pt with leakage around rectal tube and requiring total A for peri hygiene/total bed change. Noted sores on peri area, RN informed. D/c recs remain appropriate, will continue to follow.   Follow Up Recommendations  SNF;Supervision/Assistance - 24 hour    Equipment Recommendations  None recommended by OT    Recommendations for Other Services      Precautions / Restrictions Precautions Precautions: Fall Restrictions Weight Bearing Restrictions: No       Mobility Bed Mobility Overal bed mobility: Needs Assistance Bed Mobility: Supine to Sit;Sit to Supine Rolling: Total assist;+2 for physical assistance;+2 for safety/equipment   Supine to sit: Max assist;+2 for safety/equipment Sit to supine: Max assist;+2 for physical assistance   General bed mobility comments: cued for direction and assisted trunk and LE's appropriately per cervical precautions for safety. Increased assist to roll due pt resisting therapist  Transfers Overall transfer level: Needs  assistance Equipment used: 1 person hand held assist Transfers: Sit to/from Stand Sit to Stand: Max assist;+2 safety/equipment         General transfer comment: cues for hand placement and assist to bring pt forward and assist boost for bed linen change    Balance Overall balance assessment: Needs assistance Sitting-balance support: Bilateral upper extremity supported;Single extremity supported;Feet supported Sitting balance-Leahy Scale: Poor Sitting balance - Comments: required assist at this point with pt unable to stay in midline and listing forward or backward.   Standing balance support: Bilateral upper extremity supported;Single extremity supported;During functional activity Standing balance-Leahy Scale: Zero Standing balance comment: pt stood at EOB for the time it took for completion of linen change and pad placement.                           ADL either performed or assessed with clinical judgement   ADL Overall ADL's : Needs assistance/impaired                         Toilet Transfer: Total assistance;+2 for safety/equipment Toilet Transfer Details (indicate cue type and reason): pt able to stand with max A +2, but requires increased assist to initiate pivotal steps Toileting- Clothing Manipulation and Hygiene: Total assistance;Bed level Toileting - Clothing Manipulation Details (indicate cue type and reason): has rectal pouch, but continues to have leakage. required total A for peri care. Pt fighting staff, attempted to roll opposite direction and pull at lines/push staff hands away. Has wounds present on bottom- RN aware     Functional mobility during ADLs: Maximal assistance;+2 for safety/equipment (standing only) General ADL Comments: continues  to requires max ADL support. Was recently intubated, but since extubated has been able to tolerate 2L [place] with O2 sats stable     Vision Patient Visual Report: No change from baseline     Perception      Praxis      Cognition Arousal/Alertness: Lethargic Behavior During Therapy: Flat affect Overall Cognitive Status: No family/caregiver present to determine baseline cognitive functioning Area of Impairment: Attention;Memory;Following commands;Safety/judgement;Awareness;Problem solving                   Current Attention Level: Focused Memory: Decreased short-term memory Following Commands: Follows one step commands with increased time;Follows one step commands inconsistently Safety/Judgement: Decreased awareness of safety;Decreased awareness of deficits Awareness: Intellectual Problem Solving: Slow processing;Difficulty sequencing;Requires verbal cues;Requires tactile cues General Comments: increased time and cueing to follow basic mobility commands. Pt attempting to pull at lines/push away therapist hands.        Exercises     Shoulder Instructions       General Comments     Pertinent Vitals/ Pain       Pain Assessment: Faces Faces Pain Scale: Hurts little more Pain Location: sores on peri area Pain Descriptors / Indicators: Grimacing Pain Intervention(s): Monitored during session;Repositioned  Home Living                                          Prior Functioning/Environment              Frequency  Min 2X/week        Progress Toward Goals  OT Goals(current goals can now be found in the care plan section)  Progress towards OT goals: Progressing toward goals  Acute Rehab OT Goals Patient Stated Goal: Unstated OT Goal Formulation: Patient unable to participate in goal setting Time For Goal Achievement: 03/06/20 Potential to Achieve Goals: Good  Plan Discharge plan remains appropriate    Co-evaluation    PT/OT/SLP Co-Evaluation/Treatment: Yes Reason for Co-Treatment: For patient/therapist safety;To address functional/ADL transfers PT goals addressed during session: Mobility/safety with mobility OT goals addressed during  session: ADL's and self-care;Proper use of Adaptive equipment and DME;Strengthening/ROM      AM-PAC OT "6 Clicks" Daily Activity     Outcome Measure   Help from another person eating meals?: A Little Help from another person taking care of personal grooming?: A Little Help from another person toileting, which includes using toliet, bedpan, or urinal?: A Lot Help from another person bathing (including washing, rinsing, drying)?: A Lot Help from another person to put on and taking off regular upper body clothing?: A Lot Help from another person to put on and taking off regular lower body clothing?: A Lot 6 Click Score: 14    End of Session    OT Visit Diagnosis: Unsteadiness on feet (R26.81);Other abnormalities of gait and mobility (R26.89);Muscle weakness (generalized) (M62.81)   Activity Tolerance Patient limited by fatigue   Patient Left in bed;with call bell/phone within reach;with bed alarm set;with nursing/sitter in room   Nurse Communication Mobility status        Time: 2423-5361 OT Time Calculation (min): 27 min  Charges: OT General Charges $OT Visit: 1 Visit OT Treatments $Self Care/Home Management : 8-22 mins  Zenovia Jarred, MSOT, OTR/L [facility] Office Number: [phone] Pager: [phone]  Zenovia Jarred 02/28/2020, 6:54 PM

## 2020-02-28 NOTE — Progress Notes (Signed)
Physical Therapy Treatment Patient Details Name: [name]. MRN: [id number] DOB: 02/02/1951 Today's Date: 02/28/2020    History of Present Illness Pt is a 69 y.o. male with medical history significant of hypertension, hyperlipidemia ESRD on hemodialysis on Monday Wednesday, Friday, type 2 diabetes mellitus, anemia of chronic disease, colon cancer status post resection, epidural abscess status post laminectomy, fusion on 01/06/2020 and MRSA bacteremia presents to emergency department for evaluation of confusion and COVID-19 positive.  He is admitted with metabolic encephalopathy and COVID 19.    PT Comments    Pt was more alert and participative in general, but was still max assist overall with need for 2 persons for safety.  Emphasis on peri-care to clean up stool, but worked on transition to EOB and sit to stand to change all the sheets.  Pt's VSS overall on 2 L.    Follow Up Recommendations  SNF     Equipment Recommendations  None recommended by PT    Recommendations for Other Services       Precautions / Restrictions Precautions Precautions: Fall    Mobility  Bed Mobility Overal bed mobility: Needs Assistance Bed Mobility: Supine to Sit;Sit to Supine     Supine to sit: Max assist;+2 for safety/equipment Sit to supine: Max assist;+2 for physical assistance   General bed mobility comments: cued for direction and assisted trunk and LE's appropriately  Transfers Overall transfer level: Needs assistance   Transfers: Sit to/from Stand Sit to Stand: Max assist;+2 safety/equipment         General transfer comment: cues for hand placement and assist to bring pt forward and assist boost.  Ambulation/Gait             General Gait Details: NT   Stairs             Wheelchair Mobility    Modified Rankin (Stroke Patients Only)       Balance Overall balance assessment: Needs assistance Sitting-balance support: Bilateral upper extremity  supported;Single extremity supported;Feet supported Sitting balance-Leahy Scale: Poor Sitting balance - Comments: required assist at this point with pt unable to stay in midline and listing forward or backward.   Standing balance support: Bilateral upper extremity supported;Single extremity supported;During functional activity Standing balance-Leahy Scale: Zero Standing balance comment: pt stood at EOB for the time it took for completion of linen change and pad placement.                            Cognition Arousal/Alertness: Lethargic Behavior During Therapy: Flat affect Overall Cognitive Status: No family/caregiver present to determine baseline cognitive functioning                                        Exercises      General Comments General comments (skin integrity, edema, etc.): Pt was total assist for peri-care due to copious leakage around his rectal tube. vss on 2L [place]      Pertinent Vitals/Pain Pain Assessment: Faces Faces Pain Scale: No hurt Pain Intervention(s): Monitored during session    Home Living                      Prior Function            PT Goals (current goals can now be found in the care plan section) Acute Rehab  PT Goals Patient Stated Goal: Unstated PT Goal Formulation: Patient unable to participate in goal setting Time For Goal Achievement: 03/06/20 Potential to Achieve Goals: Fair Progress towards PT goals: Progressing toward goals    Frequency    Min 2X/week      PT Plan Current plan remains appropriate    Co-evaluation PT/OT/SLP Co-Evaluation/Treatment: Yes Reason for Co-Treatment: For patient/therapist safety;To address functional/ADL transfers PT goals addressed during session: Mobility/safety with mobility OT goals addressed during session: ADL's and self-care;Strengthening/ROM      AM-PAC PT "6 Clicks" Mobility   Outcome Measure  Help needed turning from your back to your side while in  a flat bed without using bedrails?: Total Help needed moving from lying on your back to sitting on the side of a flat bed without using bedrails?: Total Help needed moving to and from a bed to a chair (including a wheelchair)?: Total Help needed standing up from a chair using your arms (e.g., wheelchair or bedside chair)?: Total Help needed to walk in hospital room?: Total Help needed climbing 3-5 steps with a railing? : Total 6 Click Score: 6    End of Session Equipment Utilized During Treatment: Oxygen Activity Tolerance: Patient tolerated treatment well Patient left: in bed;with call bell/phone within reach;with bed alarm set Nurse Communication: Mobility status PT Visit Diagnosis: Other abnormalities of gait and mobility (R26.89);Muscle weakness (generalized) (M62.81);Other symptoms and signs involving the nervous system (R29.898)     Time: 6979-4801 PT Time Calculation (min) (ACUTE ONLY): 27 min  Charges:  $Therapeutic Activity: 8-22 mins                     02/28/2020  Ginger Carne., PT Acute Rehabilitation Services [phone]  (pager) [phone]  (office)   Tessie Fass Kaity Pitstick 02/28/2020, 6:35 PM

## 2020-02-28 NOTE — Progress Notes (Signed)
PCCM progress note  Called and spoke to patient's daughter [name] over the phone for morning update.  She was informed that patient's Precedex drip has been discontinued and he has tolerated dialysis this morning.  Plan is to transfer out of ICU if remains off Precedex drip and blood pressure remains controled.  She was updated at coretrack was placed this morning.  She continues to report patient is a full code with full scope of treatment at this time.  She is hopeful that patient will continue to progress,  Johnsie Cancel, NP-C [facility] Contact / Pager information can be found on Amion  02/28/2020, 11:38 AM

## 2020-02-29 DIAGNOSIS — D638 Anemia in other chronic diseases classified elsewhere: Secondary | ICD-10-CM | POA: Diagnosis not present

## 2020-02-29 DIAGNOSIS — N186 End stage renal disease: Secondary | ICD-10-CM | POA: Diagnosis not present

## 2020-02-29 DIAGNOSIS — I120 Hypertensive chronic kidney disease with stage 5 chronic kidney disease or end stage renal disease: Secondary | ICD-10-CM

## 2020-02-29 DIAGNOSIS — G9341 Metabolic encephalopathy: Secondary | ICD-10-CM | POA: Diagnosis not present

## 2020-02-29 DIAGNOSIS — E1122 Type 2 diabetes mellitus with diabetic chronic kidney disease: Secondary | ICD-10-CM

## 2020-02-29 DIAGNOSIS — U071 COVID-19: Secondary | ICD-10-CM | POA: Diagnosis not present

## 2020-02-29 DIAGNOSIS — Z992 Dependence on renal dialysis: Secondary | ICD-10-CM

## 2020-02-29 LAB — CBC
HCT: 23.9 % — ABNORMAL LOW (ref 39.0–52.0)
Hemoglobin: 7.6 g/dL — ABNORMAL LOW (ref 13.0–17.0)
MCH: 25.8 pg — ABNORMAL LOW (ref 26.0–34.0)
MCHC: 31.8 g/dL (ref 30.0–36.0)
MCV: 81 fL (ref 80.0–100.0)
Platelets: 107 10*3/uL — ABNORMAL LOW (ref 150–400)
RBC: 2.95 MIL/uL — ABNORMAL LOW (ref 4.22–5.81)
RDW: 21.7 % — ABNORMAL HIGH (ref 11.5–15.5)
WBC: 12.5 10*3/uL — ABNORMAL HIGH (ref 4.0–10.5)
nRBC: 4.5 % — ABNORMAL HIGH (ref 0.0–0.2)

## 2020-02-29 LAB — POCT I-STAT 7, (LYTES, BLD GAS, ICA,H+H)
Acid-Base Excess: 0 mmol/L (ref 0.0–2.0)
Bicarbonate: 22.2 mmol/L (ref 20.0–28.0)
Calcium, Ion: 1.07 mmol/L — ABNORMAL LOW (ref 1.15–1.40)
HCT: 22 % — ABNORMAL LOW (ref 39.0–52.0)
Hemoglobin: 7.5 g/dL — ABNORMAL LOW (ref 13.0–17.0)
O2 Saturation: 96 %
Patient temperature: 99
Potassium: 3.6 mmol/L (ref 3.5–5.1)
Sodium: 137 mmol/L (ref 135–145)
TCO2: 23 mmol/L (ref 22–32)
pCO2 arterial: 27.4 mmHg — ABNORMAL LOW (ref 32.0–48.0)
pH, Arterial: 7.517 — ABNORMAL HIGH (ref 7.350–7.450)
pO2, Arterial: 75 mmHg — ABNORMAL LOW (ref 83.0–108.0)

## 2020-02-29 LAB — GLUCOSE, CAPILLARY
Glucose-Capillary: 174 mg/dL — ABNORMAL HIGH (ref 70–99)
Glucose-Capillary: 273 mg/dL — ABNORMAL HIGH (ref 70–99)
Glucose-Capillary: 352 mg/dL — ABNORMAL HIGH (ref 70–99)
Glucose-Capillary: 365 mg/dL — ABNORMAL HIGH (ref 70–99)
Glucose-Capillary: 374 mg/dL — ABNORMAL HIGH (ref 70–99)
Glucose-Capillary: 89 mg/dL (ref 70–99)
Glucose-Capillary: >600 mg/dL (ref 70–99)

## 2020-02-29 LAB — BASIC METABOLIC PANEL
Anion gap: 17 — ABNORMAL HIGH (ref 5–15)
BUN: 53 mg/dL — ABNORMAL HIGH (ref 8–23)
CO2: 21 mmol/L — ABNORMAL LOW (ref 22–32)
Calcium: 7.7 mg/dL — ABNORMAL LOW (ref 8.9–10.3)
Chloride: 98 mmol/L (ref 98–111)
Creatinine, Ser: 5.94 mg/dL — ABNORMAL HIGH (ref 0.61–1.24)
GFR calc Af Amer: 10 mL/min — ABNORMAL LOW (ref >60)
GFR calc non Af Amer: 9 mL/min — ABNORMAL LOW (ref >60)
Glucose, Bld: 354 mg/dL — ABNORMAL HIGH (ref 70–99)
Potassium: 3.4 mmol/L — ABNORMAL LOW (ref 3.5–5.1)
Sodium: 136 mmol/L (ref 135–145)

## 2020-02-29 LAB — PHOSPHORUS
Phosphorus: 2.3 mg/dL — ABNORMAL LOW (ref 2.5–4.6)
Phosphorus: 2 mg/dL — ABNORMAL LOW (ref 2.5–4.6)

## 2020-02-29 LAB — MAGNESIUM
Magnesium: 2.2 mg/dL (ref 1.7–2.4)
Magnesium: 2.4 mg/dL (ref 1.7–2.4)

## 2020-02-29 MED ORDER — SEVELAMER CARBONATE 800 MG PO TABS
800.0000 mg | ORAL_TABLET | Freq: Three times a day (TID) | ORAL | Status: DC
  Administered 2020-03-01 – 2020-03-02 (×4): 800 mg
  Filled 2020-02-29 (×4): qty 1

## 2020-02-29 MED ORDER — CHLORHEXIDINE GLUCONATE CLOTH 2 % EX PADS
6.0000 | MEDICATED_PAD | Freq: Every day | CUTANEOUS | Status: DC
  Administered 2020-02-29 – 2020-03-04 (×4): 6 via TOPICAL

## 2020-02-29 MED ORDER — MIDODRINE HCL 5 MG PO TABS: 5.0000 mg | ORAL_TABLET | Freq: Three times a day (TID) | ORAL | Status: DC

## 2020-02-29 MED ORDER — INSULIN ASPART 100 UNIT/ML ~~LOC~~ SOLN
4.0000 [IU] | SUBCUTANEOUS | Status: DC
  Administered 2020-02-29 – 2020-03-03 (×18): 4 [IU] via SUBCUTANEOUS

## 2020-02-29 MED ORDER — METOPROLOL TARTRATE 5 MG/5ML IV SOLN
INTRAVENOUS | Status: AC
  Administered 2020-02-29: 2.5 mg via INTRAVENOUS
  Filled 2020-02-29: qty 5

## 2020-02-29 MED ORDER — INSULIN ASPART 100 UNIT/ML ~~LOC~~ SOLN
0.0000 [IU] | SUBCUTANEOUS | Status: DC
  Administered 2020-02-29: 9 [IU] via SUBCUTANEOUS
  Administered 2020-02-29: 5 [IU] via SUBCUTANEOUS
  Administered 2020-03-01 (×2): 2 [IU] via SUBCUTANEOUS
  Administered 2020-03-01 (×2): 5 [IU] via SUBCUTANEOUS
  Administered 2020-03-01: 4 [IU] via SUBCUTANEOUS
  Administered 2020-03-02: 2 [IU] via SUBCUTANEOUS
  Administered 2020-03-02: 1 [IU] via SUBCUTANEOUS
  Administered 2020-03-02 (×2): 2 [IU] via SUBCUTANEOUS
  Administered 2020-03-02 (×2): 3 [IU] via SUBCUTANEOUS
  Administered 2020-03-03: 1 [IU] via SUBCUTANEOUS
  Administered 2020-03-03: 2 [IU] via SUBCUTANEOUS
  Administered 2020-03-03: 1 [IU] via SUBCUTANEOUS

## 2020-02-29 MED ORDER — MIDODRINE HCL 5 MG PO TABS
10.0000 mg | ORAL_TABLET | Freq: Three times a day (TID) | ORAL | Status: DC
  Administered 2020-02-29: 10 mg
  Filled 2020-02-29: qty 2

## 2020-02-29 MED ORDER — DARBEPOETIN ALFA 150 MCG/0.3ML IJ SOSY
150.0000 ug | PREFILLED_SYRINGE | INTRAMUSCULAR | Status: DC
  Filled 2020-02-29 (×2): qty 0.3

## 2020-02-29 MED ORDER — MIDODRINE HCL 5 MG PO TABS
5.0000 mg | ORAL_TABLET | Freq: Three times a day (TID) | ORAL | Status: DC
  Administered 2020-03-01 – 2020-03-02 (×4): 5 mg via ORAL
  Filled 2020-02-29 (×4): qty 1

## 2020-02-29 MED ORDER — METOPROLOL TARTRATE 5 MG/5ML IV SOLN
2.5000 mg | INTRAVENOUS | Status: DC | PRN
  Filled 2020-02-29: qty 5

## 2020-02-29 MED ORDER — POTASSIUM CHLORIDE 20 MEQ/15ML (10%) PO SOLN
40.0000 meq | Freq: Once | ORAL | Status: AC
  Administered 2020-02-29: 40 meq via ORAL
  Filled 2020-02-29: qty 30

## 2020-02-29 MED ORDER — LIP MEDEX EX OINT
TOPICAL_OINTMENT | CUTANEOUS | Status: DC | PRN
  Filled 2020-02-29: qty 7

## 2020-02-29 MED ORDER — METOPROLOL TARTRATE 5 MG/5ML IV SOLN: 2.5000 mg | INTRAVENOUS | Status: DC | PRN

## 2020-02-29 MED ORDER — ZINC OXIDE 12.8 % EX OINT
TOPICAL_OINTMENT | Freq: Three times a day (TID) | CUTANEOUS | Status: DC
  Administered 2020-02-29: 1 via TOPICAL
  Filled 2020-02-29 (×3): qty 56.7

## 2020-02-29 NOTE — Progress Notes (Signed)
I have updated patient's daughter on patient's current  vital signs, imaging , and labs. We discussed that her father has had some improvement , and that he is  on 2 L [place] at present, with good oxygen saturations. We discussed that the patient's blood sugars have been labile, and that we have made few changes, and that he remains confused. . We discussed treatment plan for the day, questions were asked and answered, I have encouraged patient's daughter to reach out to the nursing staff for an Diaz visit, as this may help orient the patient  .   Magdalen Spatz, MSN, AGACNP-BC [place] for personal pager PCCM on call pager [phone] 02/29/2020 2:26 PM

## 2020-02-29 NOTE — Progress Notes (Signed)
Pt having snore like breathing, sats 100% on 2L [place], Rhonchi in upper lobes, congested non productive cough, MD Mariane Masters called to room to see pt, ABG ordered. ABG results called to [facility] MD, RT suggest BIPAP for breathing pattern. Orders received for BIPAP.

## 2020-02-29 NOTE — Progress Notes (Addendum)
Inpatient Diabetes Program Recommendations  AACE/ADA: New Consensus Statement on Inpatient Glycemic Control (2015)  Target Ranges:  Prepandial:   less than 140 mg/dL      Peak postprandial:   less than 180 mg/dL (1-2 hours)      Critically ill patients:  140 - 180 mg/dL   Results for KIRIL, HIPPE (MRN [id number]) as of 02/29/2020 13:57  Ref. Range 02/28/2020 15:45 02/28/2020 19:44 02/28/2020 23:26 02/29/2020 03:48 02/29/2020 07:57 02/29/2020 11:41  Glucose-Capillary Latest Ref Range: 70 - 99 mg/dL 151 (H) 220 (H)  2 units NOVOLOG  351 (H)  5 units NOVOLOG  374 (H)  5 units NOVOLOG  352 (H)  5 units NOVOLOG  365 (H)  13 units NOVOLOG     Home DM Meds: Levemir 11 units BID   Current Orders: Novolog 0-9 units Q4H      Novolog 4 units Q4H for TF Coverage    Novolog SSI started last PM and TF coverage to start at 12pm today.   Off IV Insulin Drip on 9/24--Has been having some issues with HYPO since then--Lantus stopped (last dose Lantus was 9/25 in the AM)--Solucortef stopped as well (last dose given yest at 11am).   Pt Extubated 9/25 but still getting TF at 55cc/hr. CBGs >300 since MN today.     MD- Please consider starting Levemir 5 units BID as well (50% home dose to start)    --Will follow patient during hospitalization--  Wyn Quaker RN, MSN, CDE Diabetes Coordinator Inpatient Glycemic Control Team Team Pager: [phone] (8a-5p)

## 2020-02-29 NOTE — Consult Note (Signed)
WOC Nurse Consult Note: Patient receiving care in [facility] 3M11.  Patient is COVID +. Reason for Consult: skin tears to buttocks from previous rectal pouch Wound type: MARSI Pressure Injury POA: Yes/No/NA Measurement: Wound bed: unfortunately, there are no photos of the area of injury; nor is there an entry in the flowsheet section of the record. Drainage (amount, consistency, odor)  Periwound: Dressing procedure/placement/frequency: triple paste to areas three times daily and prn has been ordered. Monitor the wound area(s) for worsening of condition such as: Signs/symptoms of infection,  Increase in size,  Development of or worsening of odor, Development of pain, or increased pain at the affected locations.  Notify the medical team if any of these develop. Val Riles, RN, MSN, CWOCN, CNS-BC, pager [phone]

## 2020-02-29 NOTE — Progress Notes (Signed)
[place] Progress Note Patient Name: Osaze Foye Spurling. DOB: 25-Mar-1951 MRN: [id number]   Date of Service  02/29/2020  HPI/Events of Note  Patient snoring while he sleeps. ABG on 2 L/min [place] + 7.5/27.1/74/22.2. Nursing request for BiPAP.   eICU Interventions  Plan: 1. BIPAP per RT.  2. Patient will require a formal sleep study at some point as outpatient.      Intervention Category Major Interventions: Other:  Prescott Truex Cornelia Copa 02/29/2020, 1:00 AM

## 2020-02-29 NOTE — Progress Notes (Addendum)
NAME:  [name]., MRN:  [id number], DOB:  [birth year], LOS: 9 ADMISSION DATE:  02/20/2020, CONSULTATION DATE: 02/23/2020 REFERRING MD: Albertine Patricia, MD, CHIEF COMPLAINT: Altered mental status/hypotension  Brief History   69 year old male with end-stage renal disease and liver cirrhosis from hep C, recently diagnosed with cervical epidural abscess, rapid response was called for altered mental status, shock and hypoxemia.  He was intubated and was transferred to ICU  Extubated 9/25  Past Medical History  End-stage renal disease on hemodialysis Liver cirrhosis due to hepatitis C Polysubstance abuse Colon cancer status post hemicolectomy Cervical spine epidural abscess  Significant Hospital Events   9/22 RRT was called, patient was intubated and transferred to ICU  Consults:  PCCM Nephrology GI  Procedures:  ET tube 9/22 > 9/25 Left IJ 9/22 >> 9/ Right radial A-line 9/22 > 9/25  Significant Diagnostic Tests:  9/19 CT spine without contrast > Postoperative findings status post posterior laminectomy and fusion of C4 through T1, with moderate disc space height loss and osteophytosis of C6-C7 and C7-T1. Prevertebral soft tissue edema appears reduced compared to prior examination. Postoperative fluid collection overlying laminectomy site does not appear significantly changed compared to prior examination. Assessment of the soft tissues and epidural space is generally limited on noncontrast CT and additionally limited by metallic streak/ artifact; contrast enhanced MRI is the test of choice for evaluation of edema, fluid collections, the epidural space, and discitis osteomyelitis if clinically suspected in the postoperative setting.  9/19 CT head without contrast > No acute intracranial abnormalities. Chronic white matter changes. Chronic right t/halamic lacunar infarct.  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 Possible  involvement of the right C2-C3 facet joint also associated erosion of bone since 01/28/2020 as demonstrated by CT two days ago. Resolved posterior epidural abscess following surgery last month. Satisfactorily decompressed levels C4-C5 through T1-T2. Improved thecal sac patency but mild residual degenerative spinal stenosis at both C2-C3 and C3-C4. No definite spinal cord signal abnormality.  Micro Data:  9/19 Covid > positive 9/20 MRSA PCR > negative 9/20 and  9/21 blood cultures > negative 9/24 respiratory culture >E.Coli resistant to Ampicillin   Antimicrobials:  9/19  Vancomycin  >> 9/19 rifampicin (on hold due to deranged LFTs) 9/24 Zosyn x1 9/26 Cefazolin >   Interim history/subjective:  Lying in bed on iHD, not removing volume currently due to hypotension  BiPAP overnight for snoring,  Precedex stopped AM of 9/27 Seroquel started, remains confused On 2 L [place]   Objective   Blood pressure (!) 165/80, pulse (!) 109, temperature 100.3 F (37.9 C), temperature source Axillary, resp. rate (!) 30, height 6' (1.829 m), weight 88.4 kg, SpO2 100 %.    Vent Mode: BIPAP;PCV FiO2 (%):  [40 %] 40 % Set Rate:  [12 bmp] 12 bmp PEEP:  [5 cmH20] 5 cmH20   Intake/Output Summary (Last 24 hours) at 02/29/2020 1005 Last data filed at 02/29/2020 0700 Gross per 24 hour  Intake 1616.27 ml  Output -283 ml  Net 1899.27 ml   Filed Weights   02/28/20 0500 02/28/20 1104 02/29/20 0500  Weight: 87.9 kg 88.2 kg 88.4 kg    Examination: General: Chronically ill appearing elderly male lying in bed on [place] in NAD HEENT: [place]/AT, MM pink/moist, PERRL,  Neuro: Alert to self only, non-focal , MAE x 4 CV: s1s2 regular rate and rhythm, no murmur, rubs, or gallops,  PULM:  Clear to ascultation bilaterally, no  increased work of breathing no added breath sounds GI: soft, bowel sounds active in all 4 quadrants, non-tender, non-distended, tolerating TF Extremities: warm/dry, no edema  Skin: no rashes,  or  lesions, no breakdown  Resolved Hospital Problem list   N/A  Assessment & Plan:   Acute hypoxic respiratory failure due to COVID-19 pneumonia -Improved, now on room air/2L nasal cannula HCAP -Respiratory culture positive for E.Coli 9/24 P: Continue pulmonary hygiene  Mobilize as able  Continue IV vancomycin and Cefazolin , end date 10/1  Septic shock due to 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 twice daily to complete 8-week course per infectious disease through March 03, 2020 P:  Continue IV vancomycin and Cefazolin , end date 10/1 Rifampicin placed on hold due to transaminates, this will not be resumed  Steroids discontinued 9/27 Discontinue CVC  Acute metabolic encephalopathy with delirium -Multifactorial potentially from sepsis, covid, hyperglycemia. P: Wean precedex off Seroquel started 9/27 ( 25 mg daily) Delirium precautions  Mobilize as able   Shock liver in the setting of hep C cirrhosis and sepsis -AST peaked at 4,427 and ALT peaked at 1,074.  P; LFTs improving  Intermittently trend LFT Avoid hepatotoxins  Ammonia level 9/29  End-stage renal disease on hemodialysis P: Nephrology following Follow renal function / urine output Trend Bmet Avoid nephrotoxins Ensure adequate renal perfusion   Poorly controlled Type 2 DM -Hypoglycemia given loss of enteral access. >> resolved Now Blood sugars are elevated P: TF per [place] D 10 off Will increase SSI to sensitive and add TF coverage If tolerates this well tonight, we will add 5 units of Levamir QD 9/29  HTN Plan Will add prn Lopressor for sustained SBP > 150  Was placed on BiPAP 9/28 for snoring. ABG was fine and patient did not desaturate Today patient with CO2 of 27.7 per ABG and pH of 7.51 Will  ask staff to reposition patient off back vs Bipap for snoring in future unless he has desaturations or ABG with elevated CO2.  Goals of care  Palliative care has been consulted and met with family over the phone at 9/23 and decision was made to remain full code at that time.  Palliative care will continue to work with family in collaboration with primary team to determine appropriate goals of care for patient moving forward  Best practice:  Diet: NPO>> TF Pain/Anxiety/Delirium protocol (if indicated): Seroquel VAP protocol (if indicated): Yes DVT prophylaxis: Subcu heparin GI prophylaxis: Protonix Glucose control: poorly controlled in the setting of sepsis. See above. Mobility: Bedrest Code Status: Full code Family Communication: Will attempt to update family again 9/27  Disposition: ICU.Off precedex, can consider transfer out of ICU  Labs   CBC: Recent Labs  Lab 02/23/20 0404 02/23/20 1531 02/24/20 0330 02/24/20 0859 02/25/20 0311 02/26/20 0540 02/27/20 0447 02/27/20 0447 02/27/20 0550 02/27/20 2335 02/28/20 0426 02/29/20 0053 02/29/20 0147  WBC 7.6   < > 24.1*  --  10.4  --  10.4  --  10.9*  --  11.2*  --  12.5*  NEUTROABS 5.5  --  22.9*  --   --   --   --   --   --   --   --   --   --   HGB 8.6*   < > 8.7*   < > 8.5*   < >  6.6*   < > 6.6* 7.1* 7.6* 7.5* 7.6*  HCT 25.7*   < > 26.2*   < > 25.6*   < > 20.2*   < > 19.9* 21.6* 23.4* 22.0* 23.9*  MCV 77.2*   < > 79.9*  --  77.1*  --  78.6*  --  79.0*  --  78.5*  --  81.0  PLT 309   < > 386  --  212  --  110*  --  117*  --  108*  --  107*   < > = values in this interval not displayed.    Basic Metabolic Panel: Recent Labs  Lab 02/24/20 0330 02/24/20 0859 02/24/20 1624 02/25/20 0308 02/25/20 0311 02/25/20 0311 02/25/20 1718 02/26/20 0411 02/26/20 0411 02/26/20 0540 02/27/20 0447 02/28/20 5732 02/29/20 0053 02/29/20 0147  NA 127*   < >  --    < > 134*   < >  --  131*   < > 132* 134* 134* 137 136  K 5.1   < >  --     < > 4.1   < >  --  3.9   < > 3.9 3.3* 3.6 3.6 3.4*  CL 92*  --   --   --  96*  --   --  94*  --   --  97* 97*  --  98  CO2 14*  --   --   --  21*  --   --  23  --   --  22 19*  --  21*  GLUCOSE 698*  --   --   --  157*  --   --  320*  --   --  94 175*  --  354*  BUN 31*  --   --   --  52*  --   --  49*  --   --  74* 90*  --  53*  CREATININE 7.22*  --   --   --  8.86*  --   --  5.84*  --   --  7.30* 8.83*  --  5.94*  CALCIUM 8.2*  --   --   --  8.1*  --   --  7.3*  --   --  7.5* 7.6*  --  7.7*  MG 2.0  --  1.9  --  1.8  --  2.7*  --   --   --   --   --   --  2.2  PHOS 6.9*  --  5.4*  --  4.8*  --  1.8*  --   --   --   --  2.9  --  2.3*   < > = values in this interval not displayed.   GFR: Estimated Creatinine Clearance: 13.1 mL/min (A) (by C-G formula based on SCr of 5.94 mg/dL (H)). Recent Labs  Lab 02/27/20 0447 02/27/20 0550 02/28/20 0426 02/29/20 0147  WBC 10.4 10.9* 11.2* 12.5*    Liver Function Tests: Recent Labs  Lab 02/23/20 1531 02/23/20 1531 02/24/20 0330 02/25/20 0311 02/26/20 0411 02/28/20 0426 02/28/20 0633  AST 2,574*  --  4,427* 1,974* 697* 176*  --   ALT 717*  --  1,074* 1,050* 625* 251*  --   ALKPHOS 66  --  95 155* 134* 104  --   BILITOT 1.0  --  1.3* 1.6* 1.3* 1.4*  --   PROT 6.2*  --  7.0 6.5 5.6* 5.3*  --  ALBUMIN 2.0*   < > 2.6* 2.3* 1.7* 1.6* 1.5*   < > = values in this interval not displayed.   No results for input(s): LIPASE, AMYLASE in the last 168 hours. Recent Labs  Lab 02/23/20 0700  AMMONIA 32    ABG    Component Value Date/Time   PHART 7.517 (H) 02/29/2020 0053   PCO2ART 27.4 (L) 02/29/2020 0053   PO2ART 75 (L) 02/29/2020 0053   HCO3 22.2 02/29/2020 0053   TCO2 23 02/29/2020 0053   ACIDBASEDEF 2.0 02/25/2020 0308   O2SAT 96.0 02/29/2020 0053     Coagulation Profile: Recent Labs  Lab 02/25/20 0311 02/26/20 0411  INR 2.0* 1.7*    Cardiac Enzymes: No results for input(s): CKTOTAL, CKMB, CKMBINDEX, TROPONINI in the last  168 hours.  HbA1C: Hemoglobin A1C  Date/Time Value Ref Range Status  12/08/2013 09:21 AM 9.6  Final  09/15/2013 11:00 AM >14.0  Final   Hgb A1c MFr Bld  Date/Time Value Ref Range Status  01/12/2020 04:03 AM 6.5 (H) 4.8 - 5.6 % Final    Comment:    (NOTE) Pre diabetes:          5.7%-6.4%  Diabetes:              >6.4%  Glycemic control for   <7.0% adults with diabetes   03/02/2012 04:38 PM 6.2 (H) <5.7 % Final    Comment:    (NOTE)                                                                       According to the ADA Clinical Practice Recommendations for 2011, when HbA1c is used as a screening test:  >=6.5%   Diagnostic of Diabetes Mellitus           (if abnormal result is confirmed) 5.7-6.4%   Increased risk of developing Diabetes Mellitus References:Diagnosis and Classification of Diabetes Mellitus,Diabetes ZOXW,9604,54(UJWJX 1):S62-S69 and Standards of Medical Care in         Diabetes - 2011,Diabetes Care,2011,34 (Suppl 1):S11-S61.    CBG: Recent Labs  Lab 02/28/20 1545 02/28/20 1944 02/28/20 2326 02/29/20 0348 02/29/20 0757  GLUCAP 151* 220* 351* 374* 352*   CRITICAL CARE Performed by: Magdalen Spatz  Total critical care time: 35 minutes  Critical care time was exclusive of separately billable procedures and treating other patients.  Critical care was necessary to treat or prevent imminent or life-threatening deterioration.  Critical care was time spent personally by me on the following activities: development of treatment plan with patient and/or surrogate as well as nursing, discussions with consultants, evaluation of patient's response to treatment, examination of patient, obtaining history from patient or surrogate, ordering and performing treatments and interventions, ordering and review of laboratory studies, ordering and review of radiographic studies, pulse oximetry and re-evaluation of patient's condition.   Magdalen Spatz, MSN, AGACNP-BC [place] for personal pager PCCM on call pager [phone] Contact / Pager information can be found on Amion  02/29/2020, 10:05 AM

## 2020-02-29 NOTE — Progress Notes (Signed)
Patient taken off Bipap and placed on [place] 2 L with humidity.  Patient tolerating well at this time.  RN at bedside.

## 2020-02-29 NOTE — Progress Notes (Signed)
[facility] Progress Note  Subjective:   has been on bipap overnight with suspicion for osa per charting.  Note per charting HD on 9/27 with positive 783 mL with treatment.  Per nursing got PRN last night and not on continous meds for agitation  Review of systems:  Intermittently answers questions  Vitals:   02/29/20 0139 02/29/20 0200 02/29/20 0350 02/29/20 0400  BP: (!) 160/54 140/63  (!) 158/65  Pulse: (!) 101 100  (!) 109  Resp: (!) 22 (!) 21  (!) 21  Temp:   100.3 F (37.9 C)   TempSrc:   Axillary   SpO2: 100% 100%  100%  Weight:      Height:        Exam:  adult male in bed in NAD on bipap  NCAT  Chest cta anteriorly  S1S2 no rub   Abd soft nt obese habitus   Ext no LE edema   Neuro intermittently answers questions    R IJ TDC/  L arm AVF+bruit     CXR 9/23 - patchy perihilar/ basilar dz, no edema      OP HD: [facility] MWF   4h 47min  87kg  2/2 bath  TDC / L AVF  Hep none  -Mircera 150 mcg IV q 2 weeks (last dose 02/09/2020) -Venofer 100 mg IV X 8 doses (not started yet) -Calcitriol 1.5 mcg PO TIW  Assessment/ Plan: 1. COVID 19-recent diagnosis. Tested positive at [facility] 09/18 and again here.  Per primary team. 2. Acute hypoxic resp failure - extubated; per critical care; optimize volume status with HD 3. AMS - agitation with mittens; per primary 4. Recent epidural abscess/MRSA cervical discitis-  Sp decompression surgery 01/06/20. on Vancomycin and Rifampin here through 10/01 per charting (rifampin now off and on cefazolin) - abx per primary team discretion.  5.  ESRD - MWF HD schedule 6.  HD access - L BC AVF placed Feb 2021. Was used starting in May and had infiltration in Aug prompting [facility] placement to rest the AVF. Using [facility] here.  7.  Hypertension/volume - optimize volume with HD. Note midodrine. Will decrease from 20 mg TID to 10 mg TID 8. Anemia  - has been ordered aranesp 100 q wed - increased to 150 mcg for 9/29 onward for now. Hold OP Fe load.   9.  Metabolic bone disease - hold renvela 9/28 and reduce dose starting 9/29 for hypophos, VDRA 10.  DM-per primary    Recent Labs  Lab 02/28/20 0426 02/28/20 0633 02/29/20 0053 02/29/20 0147  K   < > 3.6 3.6 3.4*  BUN  --  90*  --  53*  CREATININE  --  8.83*  --  5.94*  CALCIUM  --  7.6*  --  7.7*  PHOS  --  2.9  --  2.3*  HGB   < >  --  7.5* 7.6*   < > = values in this interval not displayed.   Inpatient medications: . calcitRIOL  1.5 mcg Per Tube Q M,W,F-1800  . chlorhexidine  15 mL Mouth Rinse BID  . Chlorhexidine Gluconate Cloth  6 each Topical Q0600  . darbepoetin (ARANESP) injection - DIALYSIS  100 mcg Intravenous Q Wed-HD  . feeding supplement (PROSource TF)  90 mL Per Tube BID  . heparin  5,000 Units Subcutaneous Q8H  . hydrocortisone sod succinate (SOLU-CORTEF) inj  100 mg Intravenous Once  . insulin aspart  0-6 Units Subcutaneous Q4H  . mouth rinse  15 mL Mouth Rinse q12n4p  . midodrine  20 mg Per Tube TID  . multivitamin  1 tablet Per Tube QHS  . pantoprazole sodium  40 mg Per Tube Daily  . potassium chloride  20 mEq Per Tube Once  . QUEtiapine  25 mg Oral QHS  . sevelamer carbonate  1,600 mg Per Tube TID WC  . zinc sulfate  220 mg Per Tube Daily   . sodium chloride    . sodium chloride    . sodium chloride Stopped (02/21/20 2227)  .  ceFAZolin (ANCEF) IV Stopped (02/28/20 1725)  . dextrose Stopped (02/28/20 1534)  . feeding supplement (VITAL 1.5 CAL)    . vancomycin 1,000 mg (02/28/20 1007)   sodium chloride, sodium chloride, sodium chloride, alteplase, fentaNYL (SUBLIMAZE) injection, haloperidol lactate, heparin, lidocaine (PF), lidocaine-prilocaine, ondansetron **OR** ondansetron (ZOFRAN) IV, pentafluoroprop-tetrafluoroeth   Claudia Desanctis, MD 02/29/2020  6:21 AM

## 2020-03-01 ENCOUNTER — Inpatient Hospital Stay (HOSPITAL_COMMUNITY): Payer: No Typology Code available for payment source

## 2020-03-01 DIAGNOSIS — E162 Hypoglycemia, unspecified: Secondary | ICD-10-CM

## 2020-03-01 DIAGNOSIS — N186 End stage renal disease: Secondary | ICD-10-CM | POA: Diagnosis not present

## 2020-03-01 DIAGNOSIS — U071 COVID-19: Secondary | ICD-10-CM | POA: Diagnosis not present

## 2020-03-01 DIAGNOSIS — G9341 Metabolic encephalopathy: Secondary | ICD-10-CM | POA: Diagnosis not present

## 2020-03-01 DIAGNOSIS — G062 Extradural and subdural abscess, unspecified: Secondary | ICD-10-CM | POA: Diagnosis not present

## 2020-03-01 LAB — COMPREHENSIVE METABOLIC PANEL
ALT: 52 U/L — ABNORMAL HIGH (ref 0–44)
AST: 48 U/L — ABNORMAL HIGH (ref 15–41)
Albumin: 1.6 g/dL — ABNORMAL LOW (ref 3.5–5.0)
Alkaline Phosphatase: 127 U/L — ABNORMAL HIGH (ref 38–126)
Anion gap: 9 (ref 5–15)
BUN: 64 mg/dL — ABNORMAL HIGH (ref 8–23)
CO2: 24 mmol/L (ref 22–32)
Calcium: 7.6 mg/dL — ABNORMAL LOW (ref 8.9–10.3)
Chloride: 106 mmol/L (ref 98–111)
Creatinine, Ser: 7.7 mg/dL — ABNORMAL HIGH (ref 0.61–1.24)
GFR calc Af Amer: 8 mL/min — ABNORMAL LOW (ref >60)
GFR calc non Af Amer: 7 mL/min — ABNORMAL LOW (ref >60)
Glucose, Bld: 234 mg/dL — ABNORMAL HIGH (ref 70–99)
Potassium: 4.2 mmol/L (ref 3.5–5.1)
Sodium: 139 mmol/L (ref 135–145)
Total Bilirubin: 0.9 mg/dL (ref 0.3–1.2)
Total Protein: 5.5 g/dL — ABNORMAL LOW (ref 6.5–8.1)

## 2020-03-01 LAB — CBC
HCT: 21.2 % — ABNORMAL LOW (ref 39.0–52.0)
Hemoglobin: 6.8 g/dL — CL (ref 13.0–17.0)
MCH: 27.3 pg (ref 26.0–34.0)
MCHC: 32.1 g/dL (ref 30.0–36.0)
MCV: 85.1 fL (ref 80.0–100.0)
Platelets: 74 10*3/uL — ABNORMAL LOW (ref 150–400)
RBC: 2.49 MIL/uL — ABNORMAL LOW (ref 4.22–5.81)
RDW: 23.3 % — ABNORMAL HIGH (ref 11.5–15.5)
WBC: 8.4 10*3/uL (ref 4.0–10.5)
nRBC: 0.7 % — ABNORMAL HIGH (ref 0.0–0.2)

## 2020-03-01 LAB — POCT I-STAT 7, (LYTES, BLD GAS, ICA,H+H)
Acid-Base Excess: 2 mmol/L (ref 0.0–2.0)
Bicarbonate: 26.1 mmol/L (ref 20.0–28.0)
Calcium, Ion: 1.13 mmol/L — ABNORMAL LOW (ref 1.15–1.40)
HCT: 20 % — ABNORMAL LOW (ref 39.0–52.0)
Hemoglobin: 6.8 g/dL — CL (ref 13.0–17.0)
O2 Saturation: 99 %
Patient temperature: 99.1
Potassium: 4.3 mmol/L (ref 3.5–5.1)
Sodium: 142 mmol/L (ref 135–145)
TCO2: 27 mmol/L (ref 22–32)
pCO2 arterial: 37 mmHg (ref 32.0–48.0)
pH, Arterial: 7.457 — ABNORMAL HIGH (ref 7.350–7.450)
pO2, Arterial: 111 mmHg — ABNORMAL HIGH (ref 83.0–108.0)

## 2020-03-01 LAB — CULTURE, RESPIRATORY W GRAM STAIN

## 2020-03-01 LAB — GLUCOSE, CAPILLARY
Glucose-Capillary: 162 mg/dL — ABNORMAL HIGH (ref 70–99)
Glucose-Capillary: 168 mg/dL — ABNORMAL HIGH (ref 70–99)
Glucose-Capillary: 182 mg/dL — ABNORMAL HIGH (ref 70–99)
Glucose-Capillary: 188 mg/dL — ABNORMAL HIGH (ref 70–99)
Glucose-Capillary: 254 mg/dL — ABNORMAL HIGH (ref 70–99)
Glucose-Capillary: 255 mg/dL — ABNORMAL HIGH (ref 70–99)
Glucose-Capillary: 263 mg/dL — ABNORMAL HIGH (ref 70–99)

## 2020-03-01 LAB — HEMOGLOBIN AND HEMATOCRIT, BLOOD
HCT: 29.3 % — ABNORMAL LOW (ref 39.0–52.0)
Hemoglobin: 9.1 g/dL — ABNORMAL LOW (ref 13.0–17.0)

## 2020-03-01 LAB — AMMONIA: Ammonia: 30 umol/L (ref 9–35)

## 2020-03-01 LAB — PREPARE RBC (CROSSMATCH)

## 2020-03-01 LAB — MAGNESIUM: Magnesium: 2.4 mg/dL (ref 1.7–2.4)

## 2020-03-01 IMAGING — DX DG CHEST 1V PORT
1 series · 1 of 1 positions shown · non-contrast
Comparison: [DATE]

CLINICAL DATA: Respiratory distress.  COVID.

EXAM:
PORTABLE CHEST 1 VIEW

[chest ap]
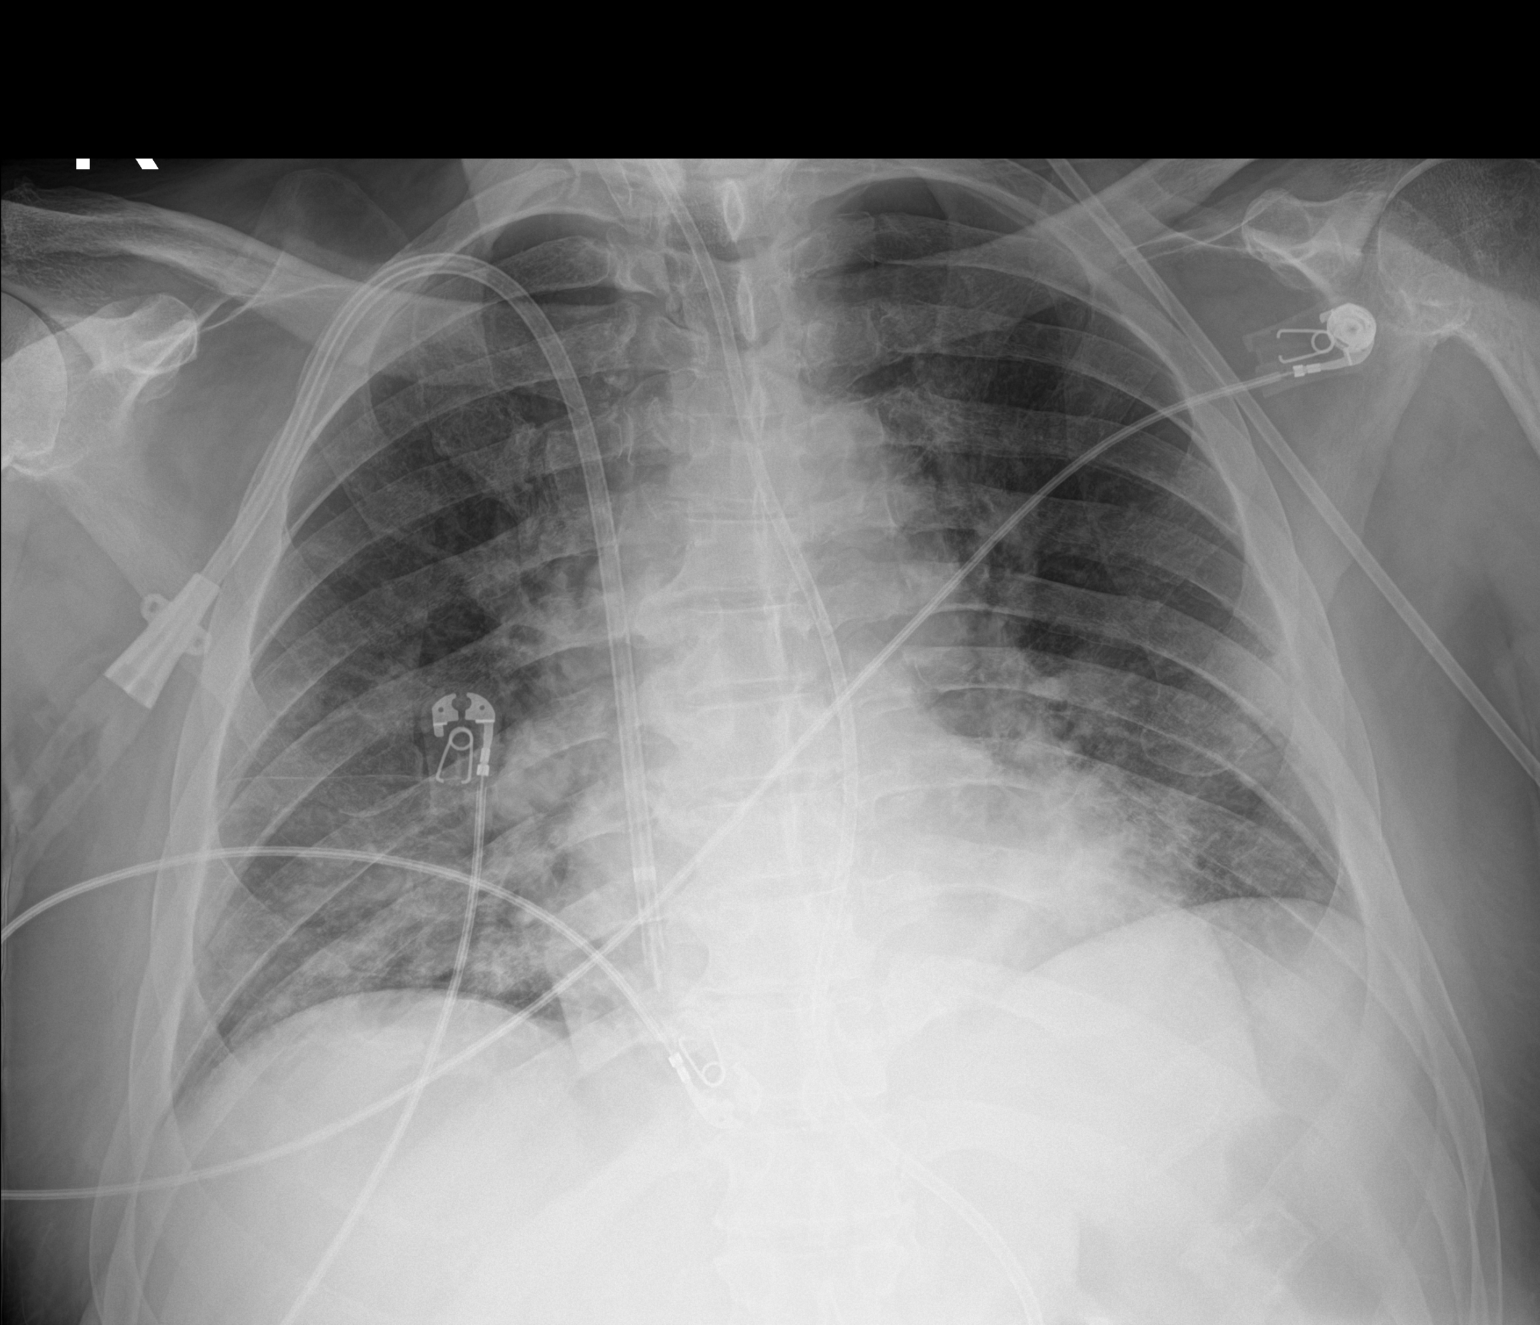

[1 of 1 positions shown; findings below may reference images not displayed]

FINDINGS: The endotracheal tube is been removed in the enteric tube converted
for a feeding tube. Enteric tube tip is off the field of view but
below the left hemidiaphragm. Right central venous catheter is
unchanged in position. No pneumothorax. Cardiac enlargement.
Bilateral basilar infiltrates suggesting multifocal pneumonia.
IMPRESSION: Bilateral basilar infiltrates suggesting multifocal pneumonia.

## 2020-03-01 MED ORDER — SODIUM CHLORIDE 0.9% IV SOLUTION: Freq: Once | INTRAVENOUS | Status: AC

## 2020-03-01 MED ORDER — QUETIAPINE FUMARATE 25 MG PO TABS
12.5000 mg | ORAL_TABLET | Freq: Every day | ORAL | Status: DC
  Administered 2020-03-01 – 2020-03-03 (×3): 12.5 mg via ORAL
  Filled 2020-03-01 (×3): qty 1

## 2020-03-01 MED ORDER — VANCOMYCIN HCL IN DEXTROSE 1-5 GM/200ML-% IV SOLN
INTRAVENOUS | Status: AC
  Administered 2020-03-01: 1000 mg via INTRAVENOUS
  Filled 2020-03-01: qty 200

## 2020-03-01 MED ORDER — INSULIN DETEMIR 100 UNIT/ML ~~LOC~~ SOLN
5.0000 [IU] | Freq: Two times a day (BID) | SUBCUTANEOUS | Status: DC
  Administered 2020-03-02 – 2020-03-04 (×6): 5 [IU] via SUBCUTANEOUS
  Filled 2020-03-01 (×10): qty 0.05

## 2020-03-01 MED ORDER — DOXYCYCLINE HYCLATE 100 MG PO TABS: 100.0000 mg | ORAL_TABLET | Freq: Two times a day (BID) | ORAL | Status: DC

## 2020-03-01 MED ORDER — DARBEPOETIN ALFA 150 MCG/0.3ML IJ SOSY
PREFILLED_SYRINGE | INTRAMUSCULAR | Status: AC
  Administered 2020-03-01: 150 ug via INTRAVENOUS
  Filled 2020-03-01: qty 0.3

## 2020-03-01 MED ORDER — ALBUMIN HUMAN 25 % IV SOLN
25.0000 g | Freq: Once | INTRAVENOUS | Status: AC
  Administered 2020-03-01: 25 g via INTRAVENOUS
  Filled 2020-03-01: qty 100

## 2020-03-01 NOTE — Progress Notes (Signed)
Pt is to finish his 8 wks of vanc on 10/1. We will put him on doxycycline for suppressive therapy after based on ID recommendation. D/w Dr. Shearon Stalls today.   Onnie Boer, PharmD, BCIDP, AAHIVP, CPP Infectious Disease Pharmacist 03/01/2020 2:35 PM

## 2020-03-01 NOTE — Progress Notes (Addendum)
NAME:  Rodney Weatherspoon., MRN:  [id number], DOB:  [birth year], LOS: 66 ADMISSION DATE:  02/20/2020, CONSULTATION DATE: 02/23/2020 REFERRING MD: Albertine Patricia, MD, CHIEF COMPLAINT: Altered mental status/hypotension  Brief History   69 year old male with end-stage renal disease and liver cirrhosis from hep C, recently diagnosed with cervical epidural abscess, rapid response was called for altered mental status, shock and hypoxemia.  He was intubated and was transferred to ICU  Extubated 9/25  Past Medical History  End-stage renal disease on hemodialysis Liver cirrhosis due to hepatitis C Polysubstance abuse Colon cancer status post hemicolectomy Cervical spine epidural abscess  Significant Hospital Events   9/22 RRT was called, patient was intubated and transferred to ICU  Consults:  PCCM Nephrology GI  Procedures:  ET tube 9/22 > 9/25 Left IJ 9/22 >> 9/ Right radial A-line 9/22 > 9/25  Significant Diagnostic Tests:  9/19 CT spine without contrast > Postoperative findings status post posterior laminectomy and fusion of C4 through T1, with moderate disc space height loss and osteophytosis of C6-C7 and C7-T1. Prevertebral soft tissue edema appears reduced compared to prior examination. Postoperative fluid collection overlying laminectomy site does not appear significantly changed compared to prior examination. Assessment of the soft tissues and epidural space is generally limited on noncontrast CT and additionally limited by metallic streak/ artifact; contrast enhanced MRI is the test of choice for evaluation of edema, fluid collections, the epidural space, and discitis osteomyelitis if clinically suspected in the postoperative setting.  9/19 CT head without contrast > No acute intracranial abnormalities. Chronic white matter changes. Chronic right t/halamic lacunar infarct.  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 Possible  involvement of the right C2-C3 facet joint also associated erosion of bone since 01/28/2020 as demonstrated by CT two days ago. Resolved posterior epidural abscess following surgery last month. Satisfactorily decompressed levels C4-C5 through T1-T2. Improved thecal sac patency but mild residual degenerative spinal stenosis at both C2-C3 and C3-C4. No definite spinal cord signal abnormality.  Micro Data:  9/19 Covid > positive 9/20 MRSA PCR > negative 9/20 and  9/21 blood cultures > negative 9/24 respiratory culture >E.Coli resistant to Ampicillin   Antimicrobials:  9/19  Vancomycin  >> 9/19 rifampicin (on hold due to deranged LFTs) 9/24 Zosyn x1 9/26 Cefazolin >   Interim history/subjective:  No acute issues overnight. Continues to have very low oxygen requirement. On HD now. Currently is sleepy, but offers no complaints. Breathing comfortably and denies pain.    Objective   Blood pressure (!) 107/57, pulse (P) 90, temperature 99 F (37.2 C), temperature source Oral, resp. rate (!) 23, height 6' (1.829 m), weight 90.2 kg, SpO2 100 %.        Intake/Output Summary (Last 24 hours) at 03/01/2020 0945 Last data filed at 03/01/2020 0700 Gross per 24 hour  Intake 1435.3 ml  Output 775 ml  Net 660.3 ml   Filed Weights   02/28/20 1104 02/29/20 0500 03/01/20 0600  Weight: 88.2 kg 88.4 kg 90.2 kg    Examination: General: elderly appearing male in NAD on HD HEENT: [place]/AT, PERRL, no JVD Neuro: Alert to self only, non-focal , follows simple commands.  CV: RRR, no MRG PULM:  Clear to ascultation bilaterally, no increased work of breathing no added breath sounds GI: soft, non-tender, tolerating tube feeds.  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 twice daily to complete 8-week course per infectious disease through March 03, 2020 P:  Continue IV vancomycin and Cefazolin , end date 10/1 Suppressive dose doxy to begin 10/2. Pharmacy managing. Rifampicin placed on hold due to transaminates, this will not be resumed   Acute metabolic encephalopathy with delirium -Multifactorial potentially from sepsis, covid, hyperglycemia. Ammonia 30 on 9/29. P: Precedex off Decrease seroquel Delirium precautions  Mobilize as able   Shock liver in the setting of hep C cirrhosis and sepsis -AST peaked at 4,427 and ALT peaked at 1,074.  P; LFTs improving  Intermittently trend LFT Avoid hepatotoxins   End-stage renal disease on hemodialysis P: Nephrology following HD today Trend Bmet  Poorly controlled Type 2 DM -Hypoglycemia given loss of enteral access. >> resolved Now Blood sugars are elevated P: TF per Cortrack Will increase SSI to sensitive and add TF coverage If tolerates this well tonight, we will add 5 units of Levamir QD 9/29 Add levemir 5 units BID per DM coordinator recs.   HTN Plan Will add prn Lopressor for sustained SBP > 150  Transfer patient out of ICU today.   Goals of care  Palliative care has been consulted and met with family over the phone at 9/23 and decision was made to remain full code at that time.  Palliative care will continue to work with family in collaboration with primary team to determine appropriate goals of care for patient moving forward  Best practice:  Diet: NPO>> TF Pain/Anxiety/Delirium protocol (if indicated):  Seroquel VAP protocol (if indicated): Yes DVT prophylaxis: Subcu heparin GI prophylaxis: Protonix Glucose control: poorly controlled in the setting of sepsis. See above. Mobility: Bedrest Code Status: Full code Family Communication: Will attempt to update family again 9/27  Disposition: ICU.Off precedex, can consider transfer out of ICU  Labs   CBC: Recent Labs  Lab 02/24/20 0330 02/24/20 0859 02/27/20 0447 02/27/20 0447 02/27/20 0550 02/27/20 2335 02/28/20 0426 02/29/20 0053 02/29/20 0147 03/01/20 0257 03/01/20 0742  WBC 24.1*   < > 10.4  --  10.9*  --  11.2*  --  12.5*  --  8.4  NEUTROABS 22.9*  --   --   --   --   --   --   --   --   --   --   HGB 8.7*   < > 6.6*   < > 6.6*   < > 7.6* 7.5* 7.6* 6.8* 6.8*  HCT 26.2*   < > 20.2*   < > 19.9*   < > 23.4* 22.0* 23.9* 20.0* 21.2*  MCV 79.9*   < > 78.6*  --  79.0*  --  78.5*  --  81.0  --  85.1  PLT 386   < > 110*  --  117*  --  108*  --  107*  --  74*   < > = values in this interval not displayed.    Basic Metabolic Panel: Recent Labs  Lab 02/25/20 0311 02/25/20 0311 02/25/20 1718 02/26/20 0411 02/26/20  3329 02/27/20 0447 02/27/20 0447 02/28/20 5188 02/29/20 0053 02/29/20 0147 02/29/20 1830 03/01/20 0257 03/01/20 0742  NA 134*   < >  --  131*   < > 134*   < > 134* 137 136  --  142 139  K 4.1   < >  --  3.9   < > 3.3*   < > 3.6 3.6 3.4*  --  4.3 4.2  CL 96*   < >  --  94*  --  97*  --  97*  --  98  --   --  106  CO2 21*   < >  --  23  --  22  --  19*  --  21*  --   --  24  GLUCOSE 157*   < >  --  320*  --  94  --  175*  --  354*  --   --  234*  BUN 52*   < >  --  49*  --  74*  --  90*  --  53*  --   --  64*  CREATININE 8.86*   < >  --  5.84*  --  7.30*  --  8.83*  --  5.94*  --   --  7.70*  CALCIUM 8.1*   < >  --  7.3*  --  7.5*  --  7.6*  --  7.7*  --   --  7.6*  MG 1.8  --  2.7*  --   --   --   --   --   --  2.2 2.4  --  2.4  PHOS 4.8*  --  1.8*  --   --   --   --  2.9  --  2.3* 2.0*  --   --    < > = values  in this interval not displayed.   GFR: Estimated Creatinine Clearance: 10.1 mL/min (A) (by C-G formula based on SCr of 7.7 mg/dL (H)). Recent Labs  Lab 02/27/20 0550 02/28/20 0426 02/29/20 0147 03/01/20 0742  WBC 10.9* 11.2* 12.5* 8.4    Liver Function Tests: Recent Labs  Lab 02/24/20 0330 02/24/20 0330 02/25/20 0311 02/26/20 0411 02/28/20 0426 02/28/20 0633 03/01/20 0742  AST 4,427*  --  1,974* 697* 176*  --  48*  ALT 1,074*  --  1,050* 625* 251*  --  52*  ALKPHOS 95  --  155* 134* 104  --  127*  BILITOT 1.3*  --  1.6* 1.3* 1.4*  --  0.9  PROT 7.0  --  6.5 5.6* 5.3*  --  5.5*  ALBUMIN 2.6*   < > 2.3* 1.7* 1.6* 1.5* 1.6*   < > = values in this interval not displayed.   No results for input(s): LIPASE, AMYLASE in the last 168 hours. Recent Labs  Lab 03/01/20 0758  AMMONIA 30    ABG    Component Value Date/Time   PHART 7.457 (H) 03/01/2020 0257   PCO2ART 37.0 03/01/2020 0257   PO2ART 111 (H) 03/01/2020 0257   HCO3 26.1 03/01/2020 0257   TCO2 27 03/01/2020 0257   ACIDBASEDEF 2.0 02/25/2020 0308   O2SAT 99.0 03/01/2020 0257     Coagulation Profile: Recent Labs  Lab 02/25/20 0311 02/26/20 0411  INR 2.0* 1.7*    Cardiac Enzymes: No results for input(s): CKTOTAL, CKMB, CKMBINDEX, TROPONINI in the last 168 hours.  HbA1C: Hemoglobin A1C  Date/Time Value Ref Range Status  12/08/2013  09:21 AM 9.6  Final  09/15/2013 11:00 AM >14.0  Final   Hgb A1c MFr Bld  Date/Time Value Ref Range Status  01/12/2020 04:03 AM 6.5 (H) 4.8 - 5.6 % Final    Comment:    (NOTE) Pre diabetes:          5.7%-6.4%  Diabetes:              >6.4%  Glycemic control for   <7.0% adults with diabetes   03/02/2012 04:38 PM 6.2 (H) <5.7 % Final    Comment:    (NOTE)                                                                       According to the ADA Clinical Practice Recommendations for 2011, when HbA1c is used as a screening test:  >=6.5%   Diagnostic of Diabetes  [place]           (if abnormal result is confirmed) 5.7-6.4%   Increased risk of developing Diabetes Mellitus References:Diagnosis and Classification of Diabetes Mellitus,Diabetes DYNX,8335,82(PPGFQ 1):S62-S69 and Standards of Medical Care in         Diabetes - 2011,Diabetes Care,2011,34 (Suppl 1):S11-S61.    CBG: Recent Labs  Lab 02/29/20 1957 02/29/20 2355 03/01/20 0352 03/01/20 0756 03/01/20 0911  GLUCAP 89 174* 255* 182* 168*     Georgann Housekeeper, AGACNP-BC [facility]  See Amion for personal pager PCCM on call pager [phone]  03/01/2020 9:56 AM

## 2020-03-01 NOTE — Progress Notes (Signed)
[facility] Progress Note  Subjective:   Note per charting HD on 9/27 with positive 783 mL with treatment.  He has been on 2 liter oxygen and ok overnight though remains confused per nursing for them oriented to person and location.  They think he will move to floor today.   Review of systems:  Intermittently answers questions; denies overt shortness of breath, cp, n/v  Vitals:   03/01/20 0300 03/01/20 0400 03/01/20 0432 03/01/20 0500  BP: (!) 155/69 (!) 153/67  (!) 160/68  Pulse: 93 89  90  Resp: (!) 24 (!) 23  (!) 22  Temp:   99.2 F (37.3 C)   TempSrc:   Oral   SpO2: 100% 100%  100%  Weight:      Height:        Exam:  adult male in bed in NAD  NCAT  Chest clear but reduced anteriorly on 2 liters S1S2 no rub   Abd soft nt obese habitus   Ext no LE edema   Neuro intermittently answers questions; oriented to person ; states is 2019    R IJ TDC/  L arm AVF+bruit     CXR 9/23 - patchy perihilar/ basilar dz, no edema      OP HD: [facility] MWF   4h 93min  87kg  2/2 bath  TDC / L AVF  Hep none  -Mircera 150 mcg IV q 2 weeks (last dose 02/09/2020) -Venofer 100 mg IV X 8 doses (not started yet) -Calcitriol 1.5 mcg PO TIW  Assessment/ Plan: 1. COVID 19-recent diagnosis. Tested positive at [facility] 09/18 and again here.  Per primary team. 2. Acute hypoxic resp failure - extubated; per critical care; optimize volume status with HD 3. AMS - agitation with mittens; per primary 4. Recent epidural abscess/MRSA cervical discitis-  Sp decompression surgery 01/06/20. on Vancomycin and Rifampin here through 10/01 per charting (rifampin now off and on cefazolin) - abx per primary team discretion.  5.  ESRD - MWF HD schedule 6.  HD access - L BC AVF placed Feb 2021. Was used starting in May and had infiltration in Aug prompting [facility] placement to rest the AVF. Using [facility] here.  7.  Hypertension/volume - optimize volume with HD. Note midodrine. On 9/28, decreased from 20 mg TID to 10 mg  TID and ultimately later to 5 mg TID 8. Anemia  - aranesp increased to 150 mcg for 9/29 onward for now. Hold OP Fe load.  Transfuse PRBC's 9.  Metabolic bone disease - hold renvela 9/28 and reduce dose starting 9/29 for hypophos, VDRA 10.  DM-per primary    Recent Labs  Lab 02/28/20 0633 02/29/20 0053 02/29/20 0147 02/29/20 1830 03/01/20 0257  K 3.6   < > 3.4*  --  4.3  BUN 90*  --  53*  --   --   CREATININE 8.83*  --  5.94*  --   --   CALCIUM 7.6*  --  7.7*  --   --   PHOS 2.9  --  2.3* 2.0*  --   HGB  --    < > 7.6*  --  6.8*   < > = values in this interval not displayed.   Inpatient medications: . calcitRIOL  1.5 mcg Per Tube Q M,W,F-1800  . chlorhexidine  15 mL Mouth Rinse BID  . Chlorhexidine Gluconate Cloth  6 each Topical Q0600  . Chlorhexidine Gluconate Cloth  6 each Topical Q0600  . darbepoetin (ARANESP) injection -  DIALYSIS  150 mcg Intravenous Q Wed-HD  . feeding supplement (PROSource TF)  90 mL Per Tube BID  . heparin  5,000 Units Subcutaneous Q8H  . hydrocortisone sod succinate (SOLU-CORTEF) inj  100 mg Intravenous Once  . insulin aspart  0-9 Units Subcutaneous Q4H  . insulin aspart  4 Units Subcutaneous Q4H  . mouth rinse  15 mL Mouth Rinse q12n4p  . midodrine  5 mg Oral TID  . multivitamin  1 tablet Per Tube QHS  . pantoprazole sodium  40 mg Per Tube Daily  . QUEtiapine  25 mg Oral QHS  . sevelamer carbonate  800 mg Per Tube TID WC  . Zinc Oxide   Topical TID  . zinc sulfate  220 mg Per Tube Daily   . sodium chloride    . sodium chloride    . sodium chloride Stopped (02/21/20 2227)  .  ceFAZolin (ANCEF) IV 1 g (02/29/20 1541)  . feeding supplement (VITAL 1.5 CAL) 1,000 mL (03/01/20 0518)  . vancomycin 1,000 mg (02/28/20 1007)   sodium chloride, sodium chloride, sodium chloride, alteplase, fentaNYL (SUBLIMAZE) injection, haloperidol lactate, heparin, lidocaine (PF), lidocaine-prilocaine, lip balm, metoprolol tartrate, ondansetron **OR** ondansetron  (ZOFRAN) IV, pentafluoroprop-tetrafluoroeth   Claudia Desanctis, MD 03/01/2020  6:19 AM

## 2020-03-01 NOTE — Progress Notes (Signed)
PMT provider chart review. Patient clinical improving with plans for transfer out of ICU today. Continue full code/full scope treatment. PMT provider will continue to follow/shadow for needs.   NO CHARGE  Ihor Dow, [place], FNP-C Palliative Medicine Team  Phone: [phone] Fax: [phone]

## 2020-03-01 NOTE — Progress Notes (Signed)
Patient trasfered from 16M to 5W30 via bed; alert and oriented x 1; no S/S of pain; IV saline locked in RFA; Cortrak left nare clamped; R IJ HD cath; LUA AV fistula; flexiseal in place; skin - skin tears on buttocks; staples - neck. Orient patient to room and unit; fall risk precautions. Will continue to monitor the patient.

## 2020-03-02 ENCOUNTER — Inpatient Hospital Stay (HOSPITAL_COMMUNITY): Payer: No Typology Code available for payment source

## 2020-03-02 DIAGNOSIS — D638 Anemia in other chronic diseases classified elsewhere: Secondary | ICD-10-CM | POA: Diagnosis not present

## 2020-03-02 DIAGNOSIS — G9341 Metabolic encephalopathy: Secondary | ICD-10-CM | POA: Diagnosis not present

## 2020-03-02 DIAGNOSIS — U071 COVID-19: Secondary | ICD-10-CM | POA: Diagnosis not present

## 2020-03-02 DIAGNOSIS — J9600 Acute respiratory failure, unspecified whether with hypoxia or hypercapnia: Secondary | ICD-10-CM | POA: Diagnosis not present

## 2020-03-02 LAB — CBC
HCT: 26.2 % — ABNORMAL LOW (ref 39.0–52.0)
Hemoglobin: 8.2 g/dL — ABNORMAL LOW (ref 13.0–17.0)
MCH: 27.2 pg (ref 26.0–34.0)
MCHC: 31.3 g/dL (ref 30.0–36.0)
MCV: 86.8 fL (ref 80.0–100.0)
Platelets: 62 10*3/uL — ABNORMAL LOW (ref 150–400)
RBC: 3.02 MIL/uL — ABNORMAL LOW (ref 4.22–5.81)
RDW: 23.3 % — ABNORMAL HIGH (ref 11.5–15.5)
WBC: 8.9 10*3/uL (ref 4.0–10.5)
nRBC: 0.3 % — ABNORMAL HIGH (ref 0.0–0.2)

## 2020-03-02 LAB — TYPE AND SCREEN
ABO/RH(D): AB POS
Antibody Screen: NEGATIVE
Unit division: 0

## 2020-03-02 LAB — COMPREHENSIVE METABOLIC PANEL
ALT: 20 U/L (ref 0–44)
AST: 35 U/L (ref 15–41)
Albumin: 1.8 g/dL — ABNORMAL LOW (ref 3.5–5.0)
Alkaline Phosphatase: 108 U/L (ref 38–126)
Anion gap: 10 (ref 5–15)
BUN: 45 mg/dL — ABNORMAL HIGH (ref 8–23)
CO2: 26 mmol/L (ref 22–32)
Calcium: 7.9 mg/dL — ABNORMAL LOW (ref 8.9–10.3)
Chloride: 103 mmol/L (ref 98–111)
Creatinine, Ser: 6.04 mg/dL — ABNORMAL HIGH (ref 0.61–1.24)
GFR calc Af Amer: 10 mL/min — ABNORMAL LOW (ref >60)
GFR calc non Af Amer: 9 mL/min — ABNORMAL LOW (ref >60)
Glucose, Bld: 224 mg/dL — ABNORMAL HIGH (ref 70–99)
Potassium: 4.2 mmol/L (ref 3.5–5.1)
Sodium: 139 mmol/L (ref 135–145)
Total Bilirubin: 0.8 mg/dL (ref 0.3–1.2)
Total Protein: 5.9 g/dL — ABNORMAL LOW (ref 6.5–8.1)

## 2020-03-02 LAB — GLUCOSE, CAPILLARY
Glucose-Capillary: 225 mg/dL — ABNORMAL HIGH (ref 70–99)
Glucose-Capillary: 195 mg/dL — ABNORMAL HIGH (ref 70–99)
Glucose-Capillary: 218 mg/dL — ABNORMAL HIGH (ref 70–99)
Glucose-Capillary: 174 mg/dL — ABNORMAL HIGH (ref 70–99)
Glucose-Capillary: 124 mg/dL — ABNORMAL HIGH (ref 70–99)

## 2020-03-02 LAB — BPAM RBC
Blood Product Expiration Date: 202110282359
ISSUE DATE / TIME: 202109291122
Unit Type and Rh: 6200

## 2020-03-02 LAB — C-REACTIVE PROTEIN: CRP: 11.7 mg/dL — ABNORMAL HIGH (ref <1.0)

## 2020-03-02 LAB — PHOSPHORUS: Phosphorus: 2.5 mg/dL (ref 2.5–4.6)

## 2020-03-02 IMAGING — DX DG CHEST 1V PORT SAME DAY
1 series · 1 of 1 positions shown · non-contrast
Comparison: Radiograph yesterday.

CLINICAL DATA: Shortness of breath.  Altered mental status.

EXAM:
PORTABLE CHEST 1 VIEW

[chest]
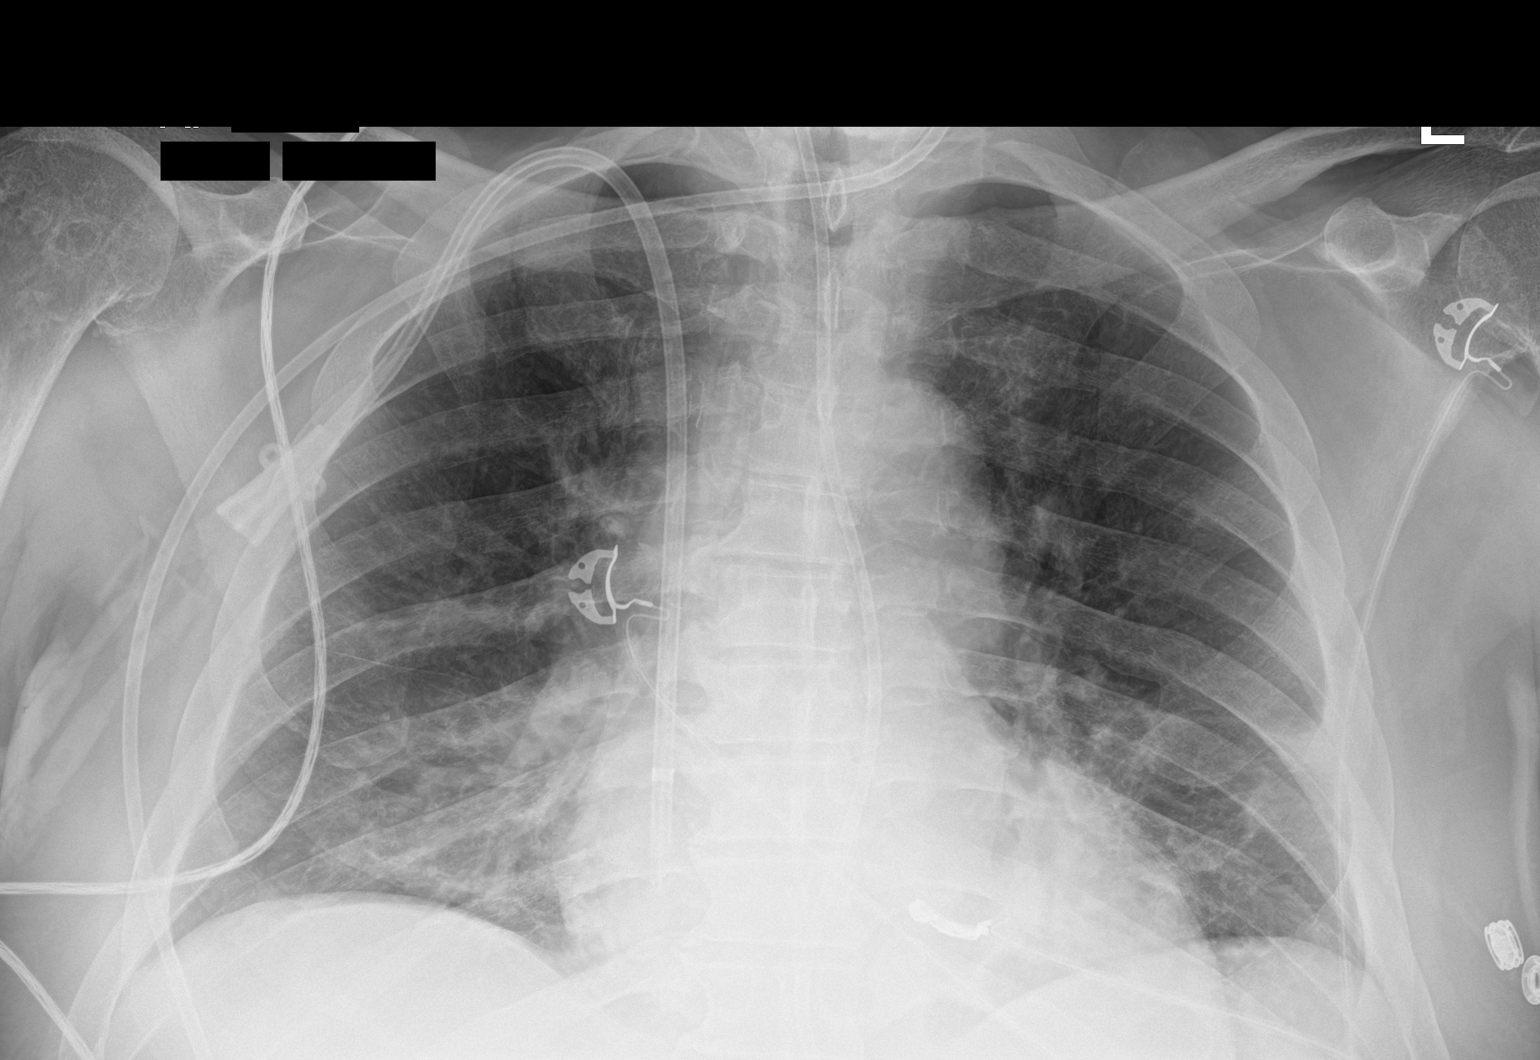

[1 of 1 positions shown; findings below may reference images not displayed]

FINDINGS: Enteric tube remains in place with tip below the diaphragm. Right
internal jugular dialysis catheter unchanged. Improving cardiomegaly
with stable mediastinal contours. Persistent but improving patchy
bibasilar opacities. No pneumothorax. No large pleural effusion.
Surgical hardware in the lower cervical spine is partially included.
IMPRESSION: 1. Persistent but improving patchy bibasilar opacities, atelectasis
versus pneumonia.
2. Improving cardiomegaly.

## 2020-03-02 MED ORDER — VITAL 1.5 CAL PO LIQD
1000.0000 mL | ORAL | Status: DC
  Administered 2020-03-02: 1000 mL
  Filled 2020-03-02 (×2): qty 1000

## 2020-03-02 MED ORDER — SODIUM CHLORIDE 0.9 % IV SOLN: 250.0000 mL | INTRAVENOUS | Status: DC | PRN

## 2020-03-02 MED ORDER — SODIUM CHLORIDE 0.9% FLUSH: 3.0000 mL | INTRAVENOUS | Status: DC | PRN

## 2020-03-02 MED ORDER — CHLORHEXIDINE GLUCONATE CLOTH 2 % EX PADS
6.0000 | MEDICATED_PAD | Freq: Every day | CUTANEOUS | Status: DC
  Administered 2020-03-05: 6 via TOPICAL

## 2020-03-02 MED ORDER — SODIUM CHLORIDE 0.9% FLUSH
3.0000 mL | Freq: Two times a day (BID) | INTRAVENOUS | Status: DC
  Administered 2020-03-02 – 2020-03-07 (×7): 3 mL via INTRAVENOUS

## 2020-03-02 MED ORDER — SODIUM CHLORIDE 0.9 % IV SOLN
0.0500 mg/kg/h | INTRAVENOUS | Status: DC
  Administered 2020-03-02 – 2020-03-05 (×2): 0.05 mg/kg/h via INTRAVENOUS
  Filled 2020-03-02 (×2): qty 250

## 2020-03-02 NOTE — Progress Notes (Addendum)
ANTICOAGULATION CONSULT NOTE - Initial Consult  Pharmacy Consult for bivalirudin Indication: R/o HIT  No Known Allergies  Patient Measurements: Height: 6' (182.9 cm) Weight: 88.6 kg (195 lb 5.2 oz) IBW/kg (Calculated) : 77.6 Heparin Dosing Weight: 82kg  Vital Signs: Temp: 98.2 F (36.8 C) (09/30 1211) Temp Source: Axillary (09/30 1211) BP: 178/79 (09/30 1211) Pulse Rate: 89 (09/30 1211)  Labs: Recent Labs    02/29/20 0147 03/01/20 0257 03/01/20 0742 03/01/20 0742 03/01/20 1750 03/02/20 1202  HGB 7.6*   < > 6.8*   < > 9.1* 8.2*  HCT 23.9*   < > 21.2*  --  29.3* 26.2*  PLT 107*  --  74*  --   --  62*  CREATININE 5.94*  --  7.70*  --   --  6.04*   < > = values in this interval not displayed.    Estimated Creatinine Clearance: 12.8 mL/min (A) (by C-G formula based on SCr of 6.04 mg/dL (H)).   Medical History: Past Medical History:  Diagnosis Date  . Anemia   . Arthritis    left knee  . Chronic kidney disease    acute renal failure/injury requiring short-term HD 2013  . Colon cancer ([place])   . Colon polyp    11/2011 - Polyps identified, biopsy - invasive adenocarinoma  . DM II (diabetes mellitus, type II), controlled ([place])    type 2 IDDM x 15 years. A1C 1/09 13.7.   Marland Kitchen 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 12/25/2011  . GERD (gastroesophageal reflux disease)   . Heart murmur   . 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 Hyperlipidemia   . Hypertension    for a few years and resolved in 2013/2014.  . Sleep apnea    does not wear CPAP now  . Wears glasses     Medications:  Medications Prior to Admission  Medication Sig Dispense Refill Last Dose  . amLODipine (NORVASC) 10 MG tablet Take 1 tablet (10 mg total) by mouth at bedtime. 30 tablet 0 02/19/2020 at 2100  . atorvastatin (LIPITOR) 20 MG tablet Take 1 tablet (20 mg total) by mouth at bedtime. 30 tablet 0 02/19/2020 at 2100  . [EXPIRED] calcitRIOL (ROCALTROL) 0.25 MCG capsule Take 5 capsules (1.25 mcg total) by  mouth 3 (three) times a week. (Patient taking differently: Take 1.25 mcg by mouth every Monday, Wednesday, and Friday. ) 60 capsule 0 02/18/2020 at 0800  . clonazePAM (KLONOPIN) 0.5 MG tablet Take 1 tablet (0.5 mg total) by mouth 2 (two) times daily as needed (Anxiety/agitation). 30 tablet 0 UNK  . gabapentin (NEURONTIN) 100 MG capsule Take 100 mg by mouth at bedtime. Left arm pain for 28 days (ends 9.29.2021)   02/19/2020  . HYDROcodone-acetaminophen (NORCO/VICODIN) 5-325 MG tablet Take 1 tablet by mouth every 6 (six) hours as needed for moderate pain.   02/20/2020 at Unknown time  . insulin detemir (LEVEMIR) 100 UNIT/ML injection Inject 0.11 mLs (11 Units total) into the skin 2 (two) times daily. 6.6 mL 0 02/20/2020  . metoprolol tartrate (LOPRESSOR) 25 MG tablet Take 1 tablet (25 mg total) by mouth 2 (two) times daily. 60 tablet 0 02/20/2020 at 0800  . Multiple Vitamin (MULTIVITAMIN WITH MINERALS) TABS tablet Take 1 tablet by mouth daily.   02/19/2020  . polyethylene glycol (MIRALAX / GLYCOLAX) 17 g packet Take 17 g by mouth daily as needed for mild constipation.   UNK  . QUEtiapine (SEROQUEL) 25 MG tablet Take 0.5 tablets (12.5 mg total)  by mouth at bedtime. 15 tablet 0 02/19/2020  . rifampin (RIFADIN) 300 MG capsule Take 1 capsule (300 mg total) by mouth every 12 (twelve) hours. 78 capsule 0 02/20/2020 at Unknown time  . [EXPIRED] sevelamer carbonate (RENVELA) 800 MG tablet Take 2 tablets (1,600 mg total) by mouth 3 (three) times daily with meals. 180 tablet 0 02/20/2020 at Unknown time  . Darbepoetin Alfa (ARANESP) 100 MCG/0.5ML SOSY injection Inject 0.5 mLs (100 mcg total) into the vein every Monday with hemodialysis. (Patient not taking: Reported on 02/21/2020) 4.2 mL  Not Taking at Unknown time  . [EXPIRED] multivitamin (RENA-VIT) TABS tablet Take 1 tablet by mouth at bedtime. (Patient not taking: Reported on 02/21/2020) 30 tablet 0 Not Taking at Unknown time   Scheduled:  . calcitRIOL  1.5 mcg Per Tube  Q M,W,F-1800  . chlorhexidine  15 mL Mouth Rinse BID  . Chlorhexidine Gluconate Cloth  6 each Topical Q0600  . Chlorhexidine Gluconate Cloth  6 each Topical Q0600  . [START ON 03/03/2020] Chlorhexidine Gluconate Cloth  6 each Topical Q0600  . darbepoetin (ARANESP) injection - DIALYSIS  150 mcg Intravenous Q Wed-HD  . [START ON 03/04/2020] doxycycline  100 mg Oral Q12H  . feeding supplement (PROSource TF)  90 mL Per Tube BID  . insulin aspart  0-9 Units Subcutaneous Q4H  . insulin aspart  4 Units Subcutaneous Q4H  . insulin detemir  5 Units Subcutaneous BID  . mouth rinse  15 mL Mouth Rinse q12n4p  . multivitamin  1 tablet Per Tube QHS  . pantoprazole sodium  40 mg Per Tube Daily  . QUEtiapine  12.5 mg Oral QHS  . sodium chloride flush  3 mL Intravenous Q12H  . Zinc Oxide   Topical TID  . zinc sulfate  220 mg Per Tube Daily   Infusions:  . sodium chloride    . sodium chloride    . sodium chloride Stopped (02/21/20 2227)  . sodium chloride    . feeding supplement (VITAL 1.5 CAL) 1,000 mL (03/01/20 2055)  . 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 His rifampin was dced after significant increase with LFTs. They have trended down to WNL. He was transferred out from the ICU. His platelets have trended down significantly with a calculated 4T score of 5 (intermediate). D/w Dr Sloan Leiter and we will dc any heparin products and start him on the HIT protocol with bivalirudin.   Plt wnl 9/19-9/24>> greater than 50% drop starting 9/26.  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 is an 69 y.o. male being  evaluated for HIT. Heparin was started 9/19 for DVT px and baseline platelets were 181.   HIT labs were ordered on 9/30 when platelets dropped to 62.  Auto-populate labs: No results found for: HEPINDPLTAB, SRALOWDOSEHP, SRAHIGHDOSEH   CALCULATE SCORE:  4Ts (see the HIT Algorithm) Score  Thrombocytopenia 2  Timing 2  Thrombosis 0  Other causes of thrombocytopenia 1  Total 5     Recommendations (A or B) are based on available lab results (HIT antibody and/or SRA) and the HIT algorithm    A. HIT  antibody result available  Possible HIT    Order SRA:  Yes  Discontinue heparin / LMWH:  Yes  Initiate alternative anticoagulation:  Yes  Document heparin allergy:  Yes    B. SRA result availability  SRA not available   Name of MD Contacted: Dr Nena Alexander  Plan (Discussed with provider) Labs ordered:  SRA ordered  Heparin allergy:  Heparin allergy documented or updated. Anticoagulation plans:  Begin alternative anticoagulation with bivalirudin   Comments (List any alternative plans or if there are contraindications to therapy)   Addendum:  Phlebotomy couldn't draw any labs because of difficult stick. D/w Dr Tonie Griffith and we will start bivalirudin then re-attempt to draw in 2 hrs.   Onnie Boer, PharmD, BCIDP, AAHIVP, CPP Infectious Disease Pharmacist 03/02/2020 7:28 PM

## 2020-03-02 NOTE — Plan of Care (Signed)
  Problem: Skin Integrity: Goal: Risk for impaired skin integrity will decrease Outcome: Progressing   Problem: Respiratory: Goal: Will maintain a patent airway Outcome: Progressing   Problem: Fluid Volume: Goal: Compliance with measures to maintain balanced fluid volume will improve Outcome: Progressing   Problem: Nutritional: Goal: Ability to make healthy dietary choices will improve Outcome: Progressing

## 2020-03-02 NOTE — Progress Notes (Addendum)
PROGRESS NOTE                                                                                                                                                                                                             Patient Demographics:    [name], is a 69 y.o. male, DOB - 1951-01-02, WUJ:[id number]  Outpatient Primary MD for the patient is [facility], [facility]   Admit date - 02/20/2020   LOS - 11  Chief Complaint  Patient presents with  . Covid Positive  . AMS       Brief Narrative: Patient is a 69 y.o. male with PMHx of ESRD on HD MWF, DM-2, MRSA bacteremia with epidural abscess s/p C4-T2 laminectomy/fusion on 01/06/2020-on IV vancomycin/rifampin through 10/1-resident of SNF-apparently tested positive for COVID-19 on 9/18-subsequently brought to the ED on 9/19 for worsening confusion (more than baseline)-fever-thought to have acute metabolic encephalopathy from COVID-19 infection and admitted to the hospitalist service.  Further hospital course complicated by development of hypotension/severe encephalopathy requiring intubation and ICU transfer.  See below for further details.  COVID-19 vaccinated status: Unvaccinated  Significant Events: 8/3-8/23>> hospitalized for epidural abscess/MRSA bacteremia-underwent laminectomy-briefly intubated due to altered mental status.  Discharged to SNF 9/18>> COVID-19 positive at SNF 9/19>> Admit to [facility] for fever/confusion 9/22>> obtunded/hypotensive after coming back from hemodialysis-emergently intubated/transferred to ICU 9/30>> transferred back to [facility] 9/30>> SQ heparin stopped-pharmacy consulted for HIT protocol.  Significant studies: 8/6>> Echo: EF> 75% 9/19>>Chest x-ray: No acute abnormality in the lung. 9/19>> CT head: No acute intracranial abnormality 9/19>> CT C-spine: Postoperative findings of posterior laminectomy/fusion C4-T1, prevertebral soft tissue edema  appears reduced, postoperative fluid collection overlies laminectomy site does not appear significantly changed compared to prior examination. 9/20>> MRI C-spine: Attempted but due to AMS-not completed 9/21>> MRI C Spine: Osteomyelitis of skull base, anterior C1 and odontoid, C2-C3 facet joint-associated erosion of bone since 8/27-resolved epidural abscess 9/22>> coarsened/heterogeneous appearance of the liver parenchyma-suspicious for cirrhosis, gallbladder not visualized. 9/22>> bilateral lower extremity Doppler: No DVT 9/29>> chest x-ray: Bibasilar infiltrates suggesting multifocal pneumonia  COVID-19 medications: Remdesivir: 9/19>> 9/21  Antibiotics: IV vancomycin plan to continue through October 1 (rifampin stopped 9/21 due to elevated LFTs) Ancef: 9/26>> Zosyn: 9/24>> 9/26  Microbiology data: 9/20 >>blood culture: No growth so far 9/21>> blood culture: No growth so far 9/24>> sputum  culture: E. coli/MSSA  Procedures: ET tube 9/22 > 9/25  Consults: Nephrology PCCM GI  DVT prophylaxis: heparin injection 5,000 Units Start: 02/20/20>> stopped on 9/30 due to worsening thrombocytopenia. SCDs Start: 02/20/20 1628   Subjective:   Brief episode of hypotension last night-BP stable this morning-remains unchanged-able to follow simple commands-answers simple questions appropriately.  Comfortable in bed.   Assessment  & Plan :   Acute hypoxic respiratory failure secondary to HCAP (E. coli/MSSA in sputum culture) and COVID-19 pneumonia: Initially admitted and thought to have mild disease-started on steroid/Remdesivir-but decompensated on 9/22-requiring intubation and ICU transfer.  Extubated 9/26-culture results as above-on IV Ancef with end date of 10/1.  No longer on Remdesivir-stopped on 9/22 due to significantly elevated LFTs.  Currently stable on just 2 L of oxygen.  Fever: afebrile O2 requirements:  SpO2: 100 % O2 Flow Rate (L/min): 2 L/min FiO2 (%): 40 %   COVID-19  Labs: No results for input(s): DDIMER, FERRITIN, LDH, CRP in the last 72 hours.  No results found for: BNP  No results for input(s): PROCALCITON in the last 168 hours.  Lab Results  Component Value Date   SARSCOV2NAA POSITIVE (A) 02/20/2020   [place] NEGATIVE 01/23/2020   [place] NEGATIVE 01/04/2020   [place] NEGATIVE 07/27/2019     Prone/Incentive Spirometry: encouraged incentive spirometry use 3-4/hour.  Septic shock: Required pressors while in the ICU-thought to be most likely secondary to [place] patient has a history of recent MRSA bacteremia/cervical spine osteomyelitis/abscess-this appears to be quiescent and stable.  IV antibiotic/culture data as above.  Acute metabolic encephalopathy: Multifactorial etiology-on initial presentation to the hospital was felt to be due to uremia (due to under dialysis-patient got short outpatient HD numerous times), sepsis physiology from COVID-19-however his mental status markedly deteriorated on 9/22-likely due to worsening sepsis physiology from HCAP.  Post extubation-has had issues with delirium/likely related to intubation/ICU stay.  Required Precedex while in the ICU-currently somewhat confused sleepy but awake- able to answer simple questions.  Remains on low-dose Seroquel-we will need titration as able.    Interestingly-from prior notes from his previous hospitalization-he apparently has short-term memory issues-there was some thought as to whether patient has underlying dementia-has a history of alcohol/cocaine use in the past.  Dysphagia: Currently with a NG tube in place-NG feedings ongoing.  Suspect this is mostly due to severe encephalopathy post extubation.  Encephalopathy seems to be gradually improving-we will go ahead and get a SLP evaluation-and see how he does.  If his oral intake is satisfactory-we can then contemplate removing NG tube over the next few days.  Elevated D-dimer: Secondary to COVID-19/multiple  procedures while in the ICU/accessing hemodialysis access-no significant hypoxemia-therefore doubt PE-lower extremity Dopplers are negative.  Suspect can be monitored without any further investigations at this point.  Will follow D-dimers periodically-however if hypoxia worsens-we will have low threshold to perform a CTA chest.  Has been on prophylactic heparin-but with worsening thrombocytopenia-this has been discontinued-we will consult pharmacy to start a alternate agent-see below.  History of MRSA bacteremia with epidural abscess requiring C4-T2 laminectomy: MRI C-spine completed on 9/21-appears to have residual osteomyelitis of the skull base area-have discussed with neurosurgeon-Dr. Kathyrn Sheriff on 9/21-apart from continuing IV antibiotics as previous-no further surgical intervention required.  Plans were to continue vancomycin/rifampin through October 1-however rifampin discontinued due to significantly elevated LFTs.  From documentation by critical care MD-and ID pharmacist (who discussed with infectious disease)-recommendations are to start suppressive doxycycline therapy on 10/1.  Patient will need follow-up with  infectious disease clinic post discharge.  Shock liver: Due to hypotension/sepsis-improving-trend LFTs intermittently.  No further recommendations from GI.  History of HCV/liver cirrhosis: HCV antibody positive in the past-we will go and check viral load with a.m. labs.  Liver ultrasound with suggestion of cirrhotic appearance.  Will need outpatient GI follow-up.  DM-2 (A1c 6.5 on 8/11): Appears to have brittle diabetes resulting in hypoglycemic episodes while in the ICU.  Allow permissive hyperglycemia-avoid tight glycemic control.  CBGs currently stable with Levemir 5 units twice daily, 4 units of NovoLog every 4 hours and sensitive SSI.   Recent Labs    03/01/20 2351 03/02/20 0313 03/02/20 0742  GLUCAP 188* 225* 195*    ESRD:On HD MWF-nephrology following.  Per  nephrology-patient does not complete outpatient HD sessions-resulted in uremia on presentation.   Metabolic acidosis: Likely secondary to incomplete HD treatments-resolved with regular HD sessions.  Hyperkalemia: Likely secondary to incomplete dialysis treatments-resolved with hemodialysis  Anemia: Secondary to ESRD-worsened by acute illness-s/p 1 unit of PRBC on 9/29-no evidence of blood loss-continue to follow closely.   Thrombocytopenia: I suspect this is multifactorial in etiology from acute illness/sepsis/thrombocytopenia-potentially some contribution from underlying liver cirrhosis-however significant drop from what he first presented with-hence HIT is in the differential-stop SQ heparin-have placed a pharmacy consult for HIT protocol-to start alternate anticoagulation prophylactically given patient is at significant risk for VTE given bedbound status/multiple acute illnesses.    HTN: BP has been slowly creeping up-monitor for 1 more day before resuming antihypertensives.  Currently all antihypertensives remain on hold.   HLD: Statin on hold-should be able to resume when closer to discharge.  Dementia: Reviewed notes from recent admission-thought to have issues with short-term memory-suspect may have underlying dementia-has history of alcohol/cocaine use.  Deconditioning/debility: Secondary to acute illness on top of chronic debility-Per daughter-has only walked a few steps at SNF.  PT eval completed-SNF on discharge.  GI prophylaxis: PPI  ABG:    Component Value Date/Time   PHART 7.457 (H) 03/01/2020 0257   PCO2ART 37.0 03/01/2020 0257   PO2ART 111 (H) 03/01/2020 0257   HCO3 26.1 03/01/2020 0257   TCO2 27 03/01/2020 0257   ACIDBASEDEF 2.0 02/25/2020 0308   O2SAT 99.0 03/01/2020 0257    Vent Settings: N/A   Condition - Stable  Family Communication  :  Daughter -Jontay Maston [phone])updated over the phone 9/30  Code Status :  Full Code  Diet :  Diet Order             Diet NPO time specified  Diet effective now                  Disposition Plan  :   Status is: Inpatient  Remains inpatient appropriate because:Inpatient level of care appropriate due to severity of illness  Dispo: The patient is from: SNF              Anticipated d/c is to: Home              Anticipated d/c date is: > 3 days              Patient currently is not medically stable to d/c.   Barriers to discharge: Severe encephalopathy/dysphagia requiring NG tube feedings  Antimicorbials  :    Anti-infectives (From admission, onward)   Start     Dose/Rate Route Frequency Ordered Stop   03/04/20 1000  doxycycline (VIBRA-TABS) tablet 100 mg        100 mg  Oral Every 12 hours 03/01/20 0944     02/27/20 1500  ceFAZolin (ANCEF) IVPB 1 g/50 mL premix        1 g 100 mL/hr over 30 Minutes Intravenous Every 24 hours 02/27/20 1433 03/03/20 1459   02/25/20 1000  piperacillin-tazobactam (ZOSYN) IVPB 2.25 g  Status:  Discontinued        2.25 g 100 mL/hr over 30 Minutes Intravenous Every 8 hours 02/25/20 0905 02/27/20 1433   02/25/20 0830  piperacillin-tazo (ZOSYN) NICU IV syringe 225 mg/mL  Status:  Discontinued        100 mg/kg of piperacillin  84.3 kg 84.4 mL/hr over 30 Minutes Intravenous Every 8 hours 02/25/20 0825 02/25/20 0905   02/25/20 0814  vancomycin (VANCOCIN) 1-5 GM/200ML-% IVPB       Note to Pharmacy: Cherylann Banas   : cabinet override      02/25/20 0814 02/25/20 1158   02/23/20 0642  vancomycin (VANCOCIN) 1-5 GM/200ML-% IVPB       Note to Pharmacy: [place], [place] : cabinet override      02/23/20 0642 02/23/20 1859   02/21/20 1200  vancomycin (VANCOCIN) IVPB 1000 mg/200 mL premix        1,000 mg 200 mL/hr over 60 Minutes Intravenous Every M-W-F (Hemodialysis) 02/20/20 1959 03/03/20 2359   02/21/20 1000  remdesivir 100 mg in sodium chloride 0.9 % 100 mL IVPB  Status:  Discontinued       "Followed by" Linked Group Details   100 mg 200 mL/hr over 30 Minutes  Intravenous Daily 02/20/20 1632 02/23/20 0633   02/20/20 2000  rifampin (RIFADIN) capsule 300 mg  Status:  Discontinued        300 mg Oral Every 12 hours 02/20/20 1907 02/23/20 0633   02/20/20 1730  remdesivir 200 mg in sodium chloride 0.9% 250 mL IVPB       "Followed by" Linked Group Details   200 mg 580 mL/hr over 30 Minutes Intravenous Once 02/20/20 1632 02/20/20 1913      Inpatient Medications  Scheduled Meds: . calcitRIOL  1.5 mcg Per Tube Q M,W,F-1800  . chlorhexidine  15 mL Mouth Rinse BID  . Chlorhexidine Gluconate Cloth  6 each Topical Q0600  . Chlorhexidine Gluconate Cloth  6 each Topical Q0600  . darbepoetin (ARANESP) injection - DIALYSIS  150 mcg Intravenous Q Wed-HD  . [START ON 03/04/2020] doxycycline  100 mg Oral Q12H  . feeding supplement (PROSource TF)  90 mL Per Tube BID  . heparin  5,000 Units Subcutaneous Q8H  . insulin aspart  0-9 Units Subcutaneous Q4H  . insulin aspart  4 Units Subcutaneous Q4H  . insulin detemir  5 Units Subcutaneous BID  . mouth rinse  15 mL Mouth Rinse q12n4p  . multivitamin  1 tablet Per Tube QHS  . pantoprazole sodium  40 mg Per Tube Daily  . QUEtiapine  12.5 mg Oral QHS  . Zinc Oxide   Topical TID  . zinc sulfate  220 mg Per Tube Daily   Continuous Infusions: . sodium chloride    . sodium chloride    . sodium chloride Stopped (02/21/20 2227)  .  ceFAZolin (ANCEF) IV Stopped (03/01/20 1513)  . feeding supplement (VITAL 1.5 CAL) 1,000 mL (03/01/20 2055)  . vancomycin 1,000 mg (03/01/20 1018)   PRN Meds:.sodium chloride, sodium chloride, sodium chloride, alteplase, fentaNYL (SUBLIMAZE) injection, haloperidol lactate, heparin, lidocaine (PF), lidocaine-prilocaine, lip balm, metoprolol tartrate, ondansetron **OR** ondansetron (ZOFRAN) IV, pentafluoroprop-tetrafluoroeth  Time Spent in minutes  35  See all Orders from today for further details   Oren Binet M.D on 03/02/2020 at 10:08 AM  To page go to [email or web address] - use  universal password  [facility] -  Office  [phone]    Objective:   Vitals:   03/02/20 0315 03/02/20 0400 03/02/20 0734 03/02/20 0801  BP: (!) 147/61 134/63 (!) 172/84   Pulse: 95 89 89   Resp: 20 (!) 21 19   Temp: 98.8 F (37.1 C) 98.8 F (37.1 C) 98.7 F (37.1 C)   TempSrc: Axillary Axillary Axillary   SpO2: 100% 100% 100% 100%  Weight: 88.6 kg     Height:        Wt Readings from Last 3 Encounters:  03/02/20 88.6 kg  01/28/20 86.2 kg  01/24/20 89.4 kg     Intake/Output Summary (Last 24 hours) at 03/02/2020 1008 Last data filed at 03/02/2020 0458 Gross per 24 hour  Intake 1427.5 ml  Output 1570 ml  Net -142.5 ml     Physical Exam Gen Exam: Sleepy-mildly confused but redirectable at times.  Not in any distress. HEENT:atraumatic, normocephalic Chest: B/L clear to auscultation anteriorly CVS:S1S2 regular Abdomen:soft non tender, non distended Extremities:no edema Neurology: Seems to move all 4 extremities.   Skin: no rash   Data Review:    CBC Recent Labs  Lab 02/27/20 0447 02/27/20 0447 02/27/20 0550 02/27/20 2335 02/28/20 0426 02/28/20 0426 02/29/20 0053 02/29/20 0147 03/01/20 0257 03/01/20 0742 03/01/20 1750  WBC 10.4  --  10.9*  --  11.2*  --   --  12.5*  --  8.4  --   HGB 6.6*   < > 6.6*   < > 7.6*   < > 7.5* 7.6* 6.8* 6.8* 9.1*  HCT 20.2*   < > 19.9*   < > 23.4*   < > 22.0* 23.9* 20.0* 21.2* 29.3*  PLT 110*  --  117*  --  108*  --   --  107*  --  74*  --   MCV 78.6*  --  79.0*  --  78.5*  --   --  81.0  --  85.1  --   MCH 25.7*  --  26.2  --  25.5*  --   --  25.8*  --  27.3  --   MCHC 32.7  --  33.2  --  32.5  --   --  31.8  --  32.1  --   RDW 19.0*  --  18.8*  --  20.1*  --   --  21.7*  --  23.3*  --    < > = values in this interval not displayed.    Chemistries  Recent Labs  Lab 02/25/20 0311 02/25/20 0311 02/25/20 1718 02/26/20 0411 02/26/20 0540 02/27/20 0447 02/27/20 0447 02/28/20 0426 02/28/20 0867  02/29/20 0053 02/29/20 0147 02/29/20 1830 03/01/20 0257 03/01/20 0742  NA 134*   < >  --  131*   < > 134*   < >  --  134* 137 136  --  142 139  K 4.1   < >  --  3.9   < > 3.3*   < >  --  3.6 3.6 3.4*  --  4.3 4.2  CL 96*   < >  --  94*  --  97*  --   --  97*  --  98  --   --  106  CO2 21*   < >  --  23  --  22  --   --  19*  --  21*  --   --  24  GLUCOSE 157*   < >  --  320*  --  94  --   --  175*  --  354*  --   --  234*  BUN 52*   < >  --  49*  --  74*  --   --  90*  --  53*  --   --  64*  CREATININE 8.86*   < >  --  5.84*  --  7.30*  --   --  8.83*  --  5.94*  --   --  7.70*  CALCIUM 8.1*   < >  --  7.3*  --  7.5*  --   --  7.6*  --  7.7*  --   --  7.6*  MG 1.8  --  2.7*  --   --   --   --   --   --   --  2.2 2.4  --  2.4  AST 1,974*  --   --  697*  --   --   --  176*  --   --   --   --   --  48*  ALT 1,050*  --   --  625*  --   --   --  251*  --   --   --   --   --  52*  ALKPHOS 155*  --   --  134*  --   --   --  104  --   --   --   --   --  127*  BILITOT 1.6*  --   --  1.3*  --   --   --  1.4*  --   --   --   --   --  0.9   < > = values in this interval not displayed.   ------------------------------------------------------------------------------------------------------------------ No results for input(s): CHOL, HDL, LDLCALC, TRIG, CHOLHDL, LDLDIRECT in the last 72 hours.  Lab Results  Component Value Date   HGBA1C 6.5 (H) 01/12/2020   ------------------------------------------------------------------------------------------------------------------ No results for input(s): TSH, T4TOTAL, T3FREE, THYROIDAB in the last 72 hours.  Invalid input(s): FREET3 ------------------------------------------------------------------------------------------------------------------ No results for input(s): VITAMINB12, FOLATE, FERRITIN, TIBC, IRON, RETICCTPCT in the last 72 hours.  Coagulation profile Recent Labs  Lab 02/25/20 0311 02/26/20 0411  INR 2.0* 1.7*    No results for  input(s): DDIMER in the last 72 hours.  Cardiac Enzymes No results for input(s): CKMB, TROPONINI, MYOGLOBIN in the last 168 hours.  Invalid input(s): CK ------------------------------------------------------------------------------------------------------------------ No results found for: BNP  Micro Results Recent Results (from the past 240 hour(s))  Culture, blood (routine x 2)     Status: None   Collection Time: 02/22/20  8:35 AM   Specimen: BLOOD  Result Value Ref Range Status   Specimen Description BLOOD RIGHT ANTECUBITAL  Final   Special Requests   Final    BOTTLES DRAWN AEROBIC ONLY Blood Culture results may not be optimal due to an inadequate volume of blood received in culture bottles   Culture   Final    NO GROWTH 5 DAYS Performed at [facility] [address], [place], [place] [address]    Report Status 02/27/2020 FINAL  Final  Culture, blood (routine x 2)     Status: None   Collection Time: 02/22/20  8:37  AM   Specimen: BLOOD RIGHT ARM  Result Value Ref Range Status   Specimen Description BLOOD RIGHT ARM  Final   Special Requests   Final    BOTTLES DRAWN AEROBIC ONLY Blood Culture results may not be optimal due to an inadequate volume of blood received in culture bottles   Culture   Final    NO GROWTH 5 DAYS Performed at [facility], [place] [address] [place], [place] [address]    Report Status 02/27/2020 FINAL  Final  Culture, respiratory (non-expectorated)     Status: None   Collection Time: 02/25/20  9:31 AM   Specimen: Tracheal Aspirate; Respiratory  Result Value Ref Range Status   Specimen Description TRACHEAL ASPIRATE  Final   Special Requests NONE  Final   Gram Stain   Final    MODERATE WBC PRESENT,BOTH PMN AND MONONUCLEAR ABUNDANT GRAM POSITIVE COCCI ABUNDANT GRAM NEGATIVE RODS FEW GRAM POSITIVE RODS Performed at [facility], [place] [address], [place],  [address]    Culture   Final    ABUNDANT ESCHERICHIA COLI MODERATE  STAPHYLOCOCCUS AUREUS    Report Status 03/01/2020 FINAL  Final   Organism ID, Bacteria ESCHERICHIA COLI  Final   Organism ID, Bacteria STAPHYLOCOCCUS AUREUS  Final      Susceptibility   Escherichia coli - MIC*    AMPICILLIN >=32 RESISTANT Resistant     CEFAZOLIN <=4 SENSITIVE Sensitive     CEFEPIME <=0.12 SENSITIVE Sensitive     CEFTAZIDIME <=1 SENSITIVE Sensitive     CEFTRIAXONE <=0.25 SENSITIVE Sensitive     CIPROFLOXACIN <=0.25 SENSITIVE Sensitive     GENTAMICIN <=1 SENSITIVE Sensitive     IMIPENEM <=0.25 SENSITIVE Sensitive     TRIMETH/SULFA <=20 SENSITIVE Sensitive     AMPICILLIN/SULBACTAM >=32 RESISTANT Resistant     PIP/TAZO <=4 SENSITIVE Sensitive     * ABUNDANT ESCHERICHIA COLI   Staphylococcus aureus - MIC*    CIPROFLOXACIN <=0.5 SENSITIVE Sensitive     ERYTHROMYCIN >=8 RESISTANT Resistant     GENTAMICIN <=0.5 SENSITIVE Sensitive     OXACILLIN <=0.25 SENSITIVE Sensitive     TETRACYCLINE <=1 SENSITIVE Sensitive     VANCOMYCIN <=0.5 SENSITIVE Sensitive     TRIMETH/SULFA <=10 SENSITIVE Sensitive     CLINDAMYCIN RESISTANT Resistant     RIFAMPIN >=32 RESISTANT Resistant     Inducible Clindamycin POSITIVE Resistant     * MODERATE STAPHYLOCOCCUS AUREUS    Radiology Reports CT Head Wo Contrast  Result Date: 02/20/2020 CLINICAL DATA:  Delirium.  COVID-19 positive.  Recent neck surgery. EXAM: CT HEAD WITHOUT CONTRAST TECHNIQUE: Contiguous axial images were obtained from the base of the skull through the vertex without intravenous contrast. COMPARISON:  January 28, 2020 FINDINGS: Brain: No subdural, epidural, or subarachnoid hemorrhage. A lacunar infarct in the right thalamus is stable and nonacute. Moderate white matter changes again identified. No acute cortical ischemia or acute infarct noted. No mass effect or midline shift. Ventricles and sulci are unchanged. Cerebellum, brainstem, and basal cisterns are normal. Vascular: Calcified atherosclerosis in the intracranial carotids.  Skull: Normal. Negative for fracture or focal lesion. Sinuses/Orbits: Mild scattered mucosal thickening in the ethmoid air cells and left maxillary sinus. No air-fluid levels. Mastoid air cells and middle ears are well aerated. Other: None. IMPRESSION: 1. No acute intracranial abnormalities. Chronic white matter changes. Chronic right thalamic lacunar infarct. 2. Mild sinus disease as above. Electronically Signed   By: Dorise Bullion III M.D   On: 02/20/2020  14:49   CT Cervical Spine Wo Contrast  Result Date: 02/20/2020 CLINICAL DATA:  COVID positive, recent neck surgery EXAM: CT CERVICAL SPINE WITHOUT CONTRAST TECHNIQUE: Multidetector CT imaging of the cervical spine was performed without intravenous contrast. Multiplanar CT image reconstructions were also generated. COMPARISON:  CT cervical spine, 01/28/2020 FINDINGS: Alignment: Normal. Skull base and vertebrae: No acute fracture. No primary bone lesion. Erosive change of the dens and adjacent transverse process of C1 are not significantly changed compared to prior examination. Soft tissues and spinal canal: Assessment of the soft tissues is somewhat limited by metallic streak artifact and lack of intravenous contrast; within this limitation, no unexpected fluid collection overlying the laminectomy site. Prevertebral soft tissue edema appears reduced compared to prior examination. No visible canal hematoma. Disc levels: Status post posterior laminectomy and fusion of C4 through T1, with moderate disc space height loss and osteophytosis of C6-C7 and C7-T1. Upper chest: Negative. Other: None. IMPRESSION: 1. Redemonstrated postoperative findings status post posterior laminectomy and fusion of C4 through T1, with moderate disc space height loss and osteophytosis of C6-C7 and C7-T1. 2. Prevertebral soft tissue edema appears reduced compared to prior examination. 3. Postoperative fluid collection overlying laminectomy site does not appear significantly changed  compared to prior examination. 4. Assessment of the soft tissues and epidural space is generally limited on noncontrast CT and additionally limited by metallic streak artifact; contrast enhanced MRI is the test of choice for evaluation of edema, fluid collections, the epidural space, and discitis osteomyelitis if clinically suspected in the postoperative setting. Electronically Signed   By: Eddie Candle M.D.   On: 02/20/2020 14:53   MR CERVICAL SPINE WO CONTRAST  Result Date: 02/22/2020 CLINICAL DATA:  69 year old male with history of dorsal cervical epidural abscess, status post operative drainage and decompression last month on 01/06/2020. Follow-up MRI 01/29/2020 with new left occipital condyle, anterior C1 and odontoid marrow edema and erosion compatible with skull base osteomyelitis. End stage renal disease on dialysis. Confusion. Positive COVID-19. EXAM: MRI CERVICAL SPINE WITHOUT CONTRAST TECHNIQUE: Multiplanar, multisequence MR imaging of the cervical spine was performed. No intravenous contrast was administered. COMPARISON:  Recent CT head and cervical spine 02/20/2020. Postoperative MRI 01/29/2020. Preoperative MRI FINDINGS: The examination had to be discontinued just prior to completion due to confusion, agitation. Only axial GRE imaging was not obtained. Alignment: Straightening and mild reversal of cervical lordosis remains improved since 01/04/2020. Vertebrae: Abnormal marrow edema and/or T2 and STIR hyperintense soft tissue surrounding the odontoid (series 3, image 7) which has been partially eroded along with the adjacent clivus and anterior C1 ring as seen on the recent CT. Mild marrow edema in both occipital condyles, greater on the left. Faint marrow edema also in the C2-C3 facets, mostly on the right (series 3, image 2). Overall the appearance has not progressed since 01/22/2020. Sequelae of posterior decompression and fusion from C3 through T1. Mild hardware susceptibility artifact. No other  cervical or upper thoracic marrow edema or acute osseous abnormality. Cord: Suboptimal cord detail due to motion and hardware susceptibility. No definite cervical or upper thoracic spinal cord signal abnormality. Normalized epidural space since August. Posterior Fossa, vertebral arteries, paraspinal tissues: Major vascular flow voids are preserved in the neck. Postoperative changes to the posterior neck soft tissues with no adverse features. Negative visible lung apices. Cervicomedullary junction and visible posterior fossa are stable and within normal limits. Disc levels: C2-C3: Improved thecal sac patency since 01/04/2020. Foraminal endplate spurring and moderate bilateral facet degeneration. Mild residual spinal  stenosis. Mild bilateral C3 foraminal stenosis. C3-C4: Chronic central disc protrusion (series 2, image 7) with mild facet and ligament flavum hypertrophy. Improved thecal sac patency since 01/04/2020 but residual mild spinal stenosis and mild cord mass effect. Moderate bilateral C4 foraminal stenosis. C4-C5 through T1-T2: Posterior decompression with no significant spinal stenosis despite residual disc and endplate degeneration at some levels. There is mild to moderate bilateral C8 foraminal stenosis related to disc, endplate and residual facet hypertrophy at C7-T1. IMPRESSION: 1. 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 Possible involvement of the right C2-C3 facet joint also. Associated erosion of bone since 01/28/2020 as demonstrated by CT two days ago. But on MRI no other significant progression is identified since August; no new levels of involvement. 2. Resolved posterior epidural abscess following surgery last month. Satisfactorily decompressed levels C4-C5 through T1-T2. 3. Improved thecal sac patency but mild residual degenerative spinal stenosis at both C2-C3 and C3-C4. No definite spinal cord signal abnormality. Electronically Signed   By: Genevie Ann M.D.   On: 02/22/2020 11:48    DG CHEST PORT 1 VIEW  Result Date: 03/01/2020 CLINICAL DATA:  Respiratory distress.  COVID. EXAM: PORTABLE CHEST 1 VIEW COMPARISON:  02/24/2020 FINDINGS: The endotracheal tube is been removed in the enteric tube converted for a feeding tube. Enteric tube tip is off the field of view but below the left hemidiaphragm. Right central venous catheter is unchanged in position. No pneumothorax. Cardiac enlargement. Bilateral basilar infiltrates suggesting multifocal pneumonia. IMPRESSION: Bilateral basilar infiltrates suggesting multifocal pneumonia. Electronically Signed   By: Lucienne Capers M.D.   On: 03/01/2020 05:02   DG CHEST PORT 1 VIEW  Result Date: 02/24/2020 CLINICAL DATA:  COVID-19 positive 02/20/2020, respiratory failure, intubated EXAM: PORTABLE CHEST 1 VIEW COMPARISON:  02/23/2020 FINDINGS: Single frontal view of the chest demonstrates endotracheal tube well above carina. Enteric catheter passes below diaphragm tip excluded by collimation. Bilateral internal jugular catheters are unchanged. Cardiac silhouette is stable. Persistent patchy consolidation at the lung bases, unchanged. No effusion or pneumothorax. No acute bony abnormalities. IMPRESSION: 1. Stable support devices. 2. Persistent bibasilar patchy consolidation which could reflect atelectasis or pneumonia. Electronically Signed   By: Randa Ngo M.D.   On: 02/24/2020 18:07   DG CHEST PORT 1 VIEW  Result Date: 02/23/2020 CLINICAL DATA:  Encounter for central line placement EXAM: PORTABLE CHEST 1 VIEW COMPARISON:  02/21/2020 FINDINGS: Endotracheal tube approximately 2 cm above the carina. NG tube enters the stomach with the tip not visualized. Right jugular central venous catheter tip in the right atrium unchanged. New left jugular catheter with the tip in the proximal SVC. No pneumothorax. Mild right lower lobe atelectasis. Negative for edema or effusion IMPRESSION: Left jugular central venous catheter tip in the SVC. No  pneumothorax. Mild right lower lobe atelectasis. Endotracheal tube 2 cm above the carina. Electronically Signed   By: Franchot Gallo M.D.   On: 02/23/2020 15:14   DG Chest Port 1 View  Result Date: 02/20/2020 CLINICAL DATA:  COVID EXAM: PORTABLE CHEST 1 VIEW COMPARISON:  01/12/2020 FINDINGS: Interval extubation. Interval placement of large-bore right neck vascular catheter, tip position near the superior cavoatrial junction. The heart size and mediastinal contours are within normal limits. Both lungs are clear. The visualized skeletal structures are unremarkable. IMPRESSION: 1. Interval placement of large-bore right neck vascular catheter, tip position near the superior cavoatrial junction. 2. No acute abnormality of the lungs in AP portable projection. Electronically Signed   By: Dorna Bloom.D.  On: 02/20/2020 13:43   DG Chest Port 1V same Day  Result Date: 02/21/2020 CLINICAL DATA:  Shortness of breath, COVID EXAM: PORTABLE CHEST 1 VIEW COMPARISON:  02/20/2020 FINDINGS: Right dialysis catheter remains in place, unchanged. Heart is normal size. No confluent airspace opacities or effusions. No acute bony abnormality. IMPRESSION: No acute cardiopulmonary disease. Electronically Signed   By: Rolm Baptise M.D.   On: 02/21/2020 13:03   VAS [place] LOWER EXTREMITY VENOUS (DVT)  Result Date: 02/23/2020  Lower Venous DVTStudy Indications: Elevated D dimer. Other Indications: + Covid. Risk Factors: None identified. Performing Technologist: Griffin Basil RCT RDMS  Examination Guidelines: A complete evaluation includes B-mode imaging, spectral Doppler, color Doppler, and power Doppler as needed of all accessible portions of each vessel. Bilateral testing is considered an integral part of a complete examination. Limited examinations for reoccurring indications may be performed as noted. The reflux portion of the exam is performed with the patient in reverse Trendelenburg.   +---------+---------------+---------+-----------+----------+--------------+ RIGHT    CompressibilityPhasicitySpontaneityPropertiesThrombus Aging +---------+---------------+---------+-----------+----------+--------------+ CFV      Full           Yes      Yes                                 +---------+---------------+---------+-----------+----------+--------------+ SFJ      Full                                                        +---------+---------------+---------+-----------+----------+--------------+ FV Prox  Full                                                        +---------+---------------+---------+-----------+----------+--------------+ FV Mid   Full                                                        +---------+---------------+---------+-----------+----------+--------------+ FV DistalFull                                                        +---------+---------------+---------+-----------+----------+--------------+ PFV      Full                                                        +---------+---------------+---------+-----------+----------+--------------+ POP      Full           Yes      Yes                                 +---------+---------------+---------+-----------+----------+--------------+ PTV  Full                                                        +---------+---------------+---------+-----------+----------+--------------+ PERO     Full                                                        +---------+---------------+---------+-----------+----------+--------------+   +---------+---------------+---------+-----------+----------+--------------+ LEFT     CompressibilityPhasicitySpontaneityPropertiesThrombus Aging +---------+---------------+---------+-----------+----------+--------------+ CFV      Full           Yes      Yes                                  +---------+---------------+---------+-----------+----------+--------------+ SFJ      Full                                                        +---------+---------------+---------+-----------+----------+--------------+ FV Prox  Full                                                        +---------+---------------+---------+-----------+----------+--------------+ FV Mid   Full                                                        +---------+---------------+---------+-----------+----------+--------------+ FV DistalFull                                                        +---------+---------------+---------+-----------+----------+--------------+ PFV      Full                                                        +---------+---------------+---------+-----------+----------+--------------+ POP      Full           Yes      Yes                                 +---------+---------------+---------+-----------+----------+--------------+ PTV      Full                                                        +---------+---------------+---------+-----------+----------+--------------+  PERO     Full                                                        +---------+---------------+---------+-----------+----------+--------------+     Summary: RIGHT: - There is no evidence of deep vein thrombosis in the lower extremity.  - No cystic structure found in the popliteal fossa.  LEFT: - There is no evidence of deep vein thrombosis in the lower extremity.  - No cystic structure found in the popliteal fossa.  *See table(s) above for measurements and observations. Electronically signed by Harold Barban MD on 02/23/2020 at 9:31:43 PM.    Final    US Abdomen Limited RUQ  Result Date: 02/23/2020 CLINICAL DATA:  Transaminitis EXAM: ULTRASOUND ABDOMEN LIMITED RIGHT UPPER QUADRANT COMPARISON:  None. FINDINGS: Gallbladder: The gallbladder was not visualized and may be surgically absent  Common bile duct: Diameter: 3 mm Liver: The liver parenchyma is coarsened and heterogeneous. The liver surface appears somewhat nodular there is no discrete hepatic mass. There is suggestion of bidirectional flow within the portal vein Other: The right kidney appears atrophic and echogenic. There are multiple right-sided renal cysts measuring up to approximately 3.6 cm. IMPRESSION: 1. Gallbladder not visualized. 2. Coarsened and heterogeneous appearance of the liver parenchyma with findings suspicious for underlying cirrhosis. 3. Borderline bidirectional flow within the main portal vein is suggestive of portal hypertension. 4. Echogenic and atrophic appearing right kidney consistent with the patient's history of renal failure. Electronically Signed   By: Constance Holster M.D.   On: 02/23/2020 19:21

## 2020-03-02 NOTE — Progress Notes (Signed)
[name] Progress Note  Subjective:   Hasn't had labs yet today.  Last HD on 9/29 with 0.2 kg UF.  The second time he's not ended net positive with HD per charting.  He was moved to the floor in the interim since last exam.  Feels a little better today  Review of systems:   More alert today and appears more reliable; denies overt shortness of breath, chest pain, nausea or vomiting States he does make some urine - not really able to quantify  Vitals:   03/02/20 0315 03/02/20 0400 03/02/20 0734 03/02/20 0801  BP: (!) 147/61 134/63 (!) 172/84   Pulse: 95 89 89   Resp: 20 (!) 21 19   Temp: 98.8 F (37.1 C) 98.8 F (37.1 C) 98.7 F (37.1 C)   TempSrc: Axillary Axillary Axillary   SpO2: 100% 100% 100% 100%  Weight: 88.6 kg     Height:        Exam:  adult male in bed in NAD  NCAT  Chest clear but reduced anteriorly unlabored at rest  S1S2 no rub   Abd soft nt obese habitus   Ext no LE edema; has mittens still    Neuro awakens more easily; oriented to person ; states is 2016 and unsure of location    R IJ TDC/  L arm AVF+bruit     CXR 9/23 - patchy perihilar/ basilar dz, no edema      OP HD: [facility] MWF   4h 62min  87kg  2/2 bath  TDC / L AVF  Hep none  -Mircera 150 mcg IV q 2 weeks (last dose 02/09/2020) -Venofer 100 mg IV X 8 doses (not started yet) -Calcitriol 1.5 mcg PO TIW  Assessment/ Plan: 1. COVID 19-recent diagnosis. Tested positive at [facility] 09/18 and again here.  Per primary team. 2. Acute hypoxic resp failure - extubated; per critical care; optimize volume status with HD 3. AMS - agitation - is better; per primary 4. Recent epidural abscess/MRSA cervical discitis-  S/p decompression surgery 01/06/20. S/p Vancomycin and Rifampin here through 10/01 per charting but rifampin off due to LFT's per charting.  Now on vanc and cefazolin. abx per primary team discretion.  5.  ESRD - MWF HD schedule. Ordered strict ins/outs 6.  HD access - L BC AVF placed Feb  2021. Was used starting in May and had infiltration in Aug prompting [facility] placement to rest the AVF. Using [facility] here.  7.  Hypertension/volume - optimize volume with HD. Note midodrine. On 9/28, decreased from 20 mg TID to 10 mg TID and ultimately later to 5 mg TID.  Will stop midodrine.  Per med list reportedly on anti-htn's at home 8. Anemia  - aranesp increased to 150 mcg for 9/29 onward for now. Hold OP Fe load.  s/p PRBC's 9/29 9.  Metabolic bone disease - hold renvela 9/28 and reduced dose starting 9/29 for hypophos;. No repeat available.  D/c renvela for now and update phos. on calcitriol 10.  DM-per primary    Recent Labs  Lab 02/29/20 0147 02/29/20 0147 02/29/20 1830 03/01/20 0257 03/01/20 0257 03/01/20 0742 03/01/20 1750  K 3.4*   < >  --  4.3  --  4.2  --   BUN 53*  --   --   --   --  64*  --   CREATININE 5.94*  --   --   --   --  7.70*  --   CALCIUM 7.7*  --   --   --   --  7.6*  --   PHOS 2.3*  --  2.0*  --   --   --   --   HGB 7.6*   < >  --  6.8*   < > 6.8* 9.1*   < > = values in this interval not displayed.   Inpatient medications: . calcitRIOL  1.5 mcg Per Tube Q M,W,F-1800  . chlorhexidine  15 mL Mouth Rinse BID  . Chlorhexidine Gluconate Cloth  6 each Topical Q0600  . Chlorhexidine Gluconate Cloth  6 each Topical Q0600  . darbepoetin (ARANESP) injection - DIALYSIS  150 mcg Intravenous Q Wed-HD  . [START ON 03/04/2020] doxycycline  100 mg Oral Q12H  . feeding supplement (PROSource TF)  90 mL Per Tube BID  . heparin  5,000 Units Subcutaneous Q8H  . insulin aspart  0-9 Units Subcutaneous Q4H  . insulin aspart  4 Units Subcutaneous Q4H  . insulin detemir  5 Units Subcutaneous BID  . mouth rinse  15 mL Mouth Rinse q12n4p  . midodrine  5 mg Oral TID  . multivitamin  1 tablet Per Tube QHS  . pantoprazole sodium  40 mg Per Tube Daily  . QUEtiapine  12.5 mg Oral QHS  . sevelamer carbonate  800 mg Per Tube TID WC  . Zinc Oxide   Topical TID  . zinc sulfate  220 mg  Per Tube Daily   . sodium chloride    . sodium chloride    . sodium chloride Stopped (02/21/20 2227)  .  ceFAZolin (ANCEF) IV Stopped (03/01/20 1513)  . feeding supplement (VITAL 1.5 CAL) 1,000 mL (03/01/20 2055)  . vancomycin 1,000 mg (03/01/20 1018)   sodium chloride, sodium chloride, sodium chloride, alteplase, fentaNYL (SUBLIMAZE) injection, haloperidol lactate, heparin, lidocaine (PF), lidocaine-prilocaine, lip balm, metoprolol tartrate, ondansetron **OR** ondansetron (ZOFRAN) IV, pentafluoroprop-tetrafluoroeth   Claudia Desanctis, MD 03/02/2020  10:27 AM

## 2020-03-02 NOTE — Progress Notes (Signed)
Physical Therapy Treatment Patient Details Name: [name]. MRN: [id number] DOB: 09/15/1950 Today's Date: 03/02/2020    History of Present Illness Pt is a 69 y.o. male with d/c to SNF after prolonged complicated admission s/p C4-T2 laminectomy fusion (01/06/20), now admitted from [facility] 02/20/20 with confusion. Workup for metabolic encephalopathy, shock hypoxia; (+) COVID-19. 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 ETT 9/22-9/25. PMH includes HTN, HLD, ESRD (HD MWF), colon CA s/p resection.   PT Comments    Pt slowly progressing with mobility. Requires maxA+2 to [place] for mobility; tolerated transfer to recliner with maximove lift. Remains limited by generalized weakness, decreased awareness, inconsistent command following and lethargy, although becoming more alert once sitting up in chair. SpO2 100% on RA. Continued to recommend SNF-level therapies to maximize functional mobility.    Follow Up Recommendations  SNF;Supervision/Assistance - 24 hour     Equipment Recommendations  None recommended by PT    Recommendations for Other Services       Precautions / Restrictions Precautions Precautions: Fall;Other (comment) Precaution Comments: Rectal tube, cortrak; bilateral soft mitts Restrictions Weight Bearing Restrictions: No    Mobility  Bed Mobility Overal bed mobility: Needs Assistance Bed Mobility: Rolling Rolling: Max assist;+2 for physical assistance;+2 for safety/equipment         General bed mobility comments: Roll R/L for pericare and maximove pad placement; pt initiating BLE movement, although inconsistently to command, max multimodal cues to assist with BUE support on bed rails  Transfers                 General transfer comment: Transfer to recliner totalA with maximove lift, pt tolerated well  Ambulation/Gait                 Stairs             Wheelchair Mobility    Modified  [place] (Stroke Patients Only)       Balance Overall balance assessment: Needs assistance   Sitting balance-Leahy Scale: Zero Sitting balance - Comments: Pt with minimal initiation of anterior weight translation to edge of recliner requiring maxA+2 to totalA to maintain upright balance                                    Cognition Arousal/Alertness: Awake/alert;Lethargic Behavior During Therapy: Flat affect Overall Cognitive Status: No family/caregiver present to determine baseline cognitive functioning Area of Impairment: Attention;Memory;Following commands;Safety/judgement;Awareness;Problem solving                   Current Attention Level: Focused Memory: Decreased short-term memory Following Commands: Follows one step commands with increased time;Follows one step commands inconsistently Safety/Judgement: Decreased awareness of safety;Decreased awareness of deficits Awareness: Intellectual Problem Solving: Slow processing;Difficulty sequencing;Requires verbal cues;Requires tactile cues General Comments: increased time and cueing to follow basic mobility commands. Pt attempting to pull at lines, requiring frequent redirection. Fluctuating between eyes open and falling asleep; more alert once up in chair.      Exercises      General Comments General comments (skin integrity, edema, etc.): TotalA for pericare due to leakage at rectal tube, soiled sacral pad (RN notified); SpO2 100% on RA      Pertinent Vitals/Pain Pain Assessment: Faces Faces Pain Scale: No hurt Pain Intervention(s): Monitored during session;Repositioned    Home Living  Prior Function            PT Goals (current goals can now be found in the care plan section) Progress towards PT goals: Progressing toward goals    Frequency    Min 2X/week      PT Plan Current plan remains appropriate    Co-evaluation PT/OT/SLP Co-Evaluation/Treatment:  Yes Reason for Co-Treatment: Necessary to address cognition/behavior during functional activity;For patient/therapist safety;To address functional/ADL transfers PT goals addressed during session: Mobility/safety with mobility        AM-PAC PT "6 Clicks" Mobility   Outcome Measure  Help needed turning from your back to your side while in a flat bed without using bedrails?: Total Help needed moving from lying on your back to sitting on the side of a flat bed without using bedrails?: Total Help needed moving to and from a bed to a chair (including a wheelchair)?: Total Help needed standing up from a chair using your arms (e.g., wheelchair or bedside chair)?: Total Help needed to walk in hospital room?: Total Help needed climbing 3-5 steps with a railing? : Total 6 Click Score: 6    End of Session   Activity Tolerance: Patient tolerated treatment well;Patient limited by fatigue Patient left: in chair;with call bell/phone within reach;with chair alarm set;Other (comment) (with bilateral soft mitts reapplied) Nurse Communication: Mobility status;Need for lift equipment;Other (comment) PT Visit Diagnosis: Other abnormalities of gait and mobility (R26.89);Muscle weakness (generalized) (M62.81);Other symptoms and signs involving the nervous system (R29.898)     Time: 2248-2500 PT Time Calculation (min) (ACUTE ONLY): 39 min  Charges:  $Therapeutic Activity: 23-37 mins                    Mabeline Caras, PT, DPT Acute Rehabilitation Services  Pager [phone] Office [place] 03/02/2020, 2:03 PM

## 2020-03-02 NOTE — Evaluation (Signed)
Clinical/Bedside Swallow Evaluation Patient Details  Name: [name]. MRN: [id number] Date of Birth: 1950/09/03  Today's Date: 03/02/2020 Time: SLP Start Time (ACUTE ONLY): 4098 SLP Stop Time (ACUTE ONLY): 1340 SLP Time Calculation (min) (ACUTE ONLY): 23 min  Past Medical History:  Past Medical History:  Diagnosis Date  . Anemia   . Arthritis    left knee  . Chronic kidney disease    acute renal failure/injury requiring short-term HD 2013  . Colon cancer ([place])   . Colon polyp    11/2011 - Polyps identified, biopsy - invasive adenocarinoma  . DM II (diabetes mellitus, type II), controlled ([place])    type 2 IDDM x 15 years. A1C 1/09 13.7.   Marland Kitchen 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 12/25/2011  . GERD (gastroesophageal reflux disease)   . Heart murmur   . 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 Hyperlipidemia   . Hypertension    for a few years and resolved in 2013/2014.  . Sleep apnea    does not wear CPAP now  . Wears glasses    Past Surgical History:  Past Surgical History:  Procedure Laterality Date  . APPLICATION OF WOUND VAC  02/16/2012   Procedure: APPLICATION OF WOUND VAC;  Surgeon: Zenovia Jarred, MD;  Location: [place];  Service: General;  Laterality: N/A;  . APPLICATION OF WOUND VAC  02/26/2012   Procedure: APPLICATION OF WOUND VAC;  Surgeon: Imogene Burn. [name], MD;  Location: [place];  Service: General;  Laterality: N/A;  . AV FISTULA PLACEMENT Left 07/29/2019   Procedure: ARTERIOVENOUS (AV) FISTULA CREATION;  Surgeon: Marty Heck, MD;  Location: [place];  Service: Vascular;  Laterality: Left;  . COLONOSCOPY    . COLONOSCOPY  02/06/2012   Procedure: COLONOSCOPY;  Surgeon: Milus Banister, MD;  Location: [place];  Service: Endoscopy;;  . COLONOSCOPY WITH PROPOFOL N/A 06/10/2019   Procedure: COLONOSCOPY WITH PROPOFOL;  Surgeon: Milus Banister, MD;  [facility];  Service: Endoscopy;  Laterality: N/A;  . COLOSTOMY REVERSAL    . DRESSING CHANGE  UNDER ANESTHESIA  02/18/2012   Procedure: DRESSING CHANGE UNDER ANESTHESIA;  Surgeon: Odis Hollingshead, MD;  Location: [place];  Service: General;  Laterality: N/A;  . ESOPHAGOGASTRODUODENOSCOPY (EGD) WITH PROPOFOL N/A 06/10/2019   Procedure: ESOPHAGOGASTRODUODENOSCOPY (EGD) WITH PROPOFOL;  Surgeon: Milus Banister, MD;  [facility];  Service: Endoscopy;  Laterality: N/A;  . GANGLION CYST EXCISION  20 YRS AGO   RT ARM  . ILEOSTOMY  02/16/2012   Procedure: ILEOSTOMY;  Surgeon: Zenovia Jarred, MD;  Location: [place];  Service: General;  Laterality: N/A;  . ILEOSTOMY CLOSURE N/A 09/02/2012   Procedure: ILEOSTOMY REVERSAL;  Surgeon: Imogene Burn. [name], MD;  Location: [place];  Service: General;  Laterality: N/A;  . ILIOSTOMY    . INCISION AND DRAINAGE OF WOUND  02/23/2012   Procedure: IRRIGATION AND DEBRIDEMENT WOUND;  Surgeon: Joyice Faster. Cornett, MD;  Location: [place];  Service: General;  Laterality: N/A;  . IR DIALY SHUNT INTRO [place] W/IMG LEFT Left 01/21/2020  . IR FLUORO GUIDE CV LINE RIGHT  01/21/2020  . IR REMOVAL TUN CV CATH W/O FL  01/07/2020  . IR US GUIDE VASC ACCESS LEFT  01/21/2020  . IR US GUIDE VASC ACCESS RIGHT  01/21/2020  . LAPAROTOMY  02/16/2012   Procedure: EXPLORATORY LAPAROTOMY;  Surgeon: Zenovia Jarred, MD;  Location: [place];  Service: General;  Laterality: N/A;  Exploratory  Laparotomy with resection of anastomosis  . LAPAROTOMY  02/18/2012   Procedure: EXPLORATORY LAPAROTOMY;  Surgeon: Odis Hollingshead, MD;  Location: [place];  Service: General;  Laterality: N/A;  exploratory laparotomy  change of abdominal vac dressing  . LAPAROTOMY  02/20/2012   Procedure: EXPLORATORY LAPAROTOMY;  Surgeon: Odis Hollingshead, MD;  Location: [place];  Service: General;  Laterality: N/A;  . LAPAROTOMY  02/23/2012   Procedure: EXPLORATORY LAPAROTOMY;  Surgeon: Joyice Faster. Cornett, MD;  Location: [place];  Service: General;  Laterality: N/A;  Irrigation and Debridement of abdominal wound  with wound vac change with partial closure  . LAPAROTOMY  02/26/2012   Procedure: EXPLORATORY LAPAROTOMY;  Surgeon: Imogene Burn. [name], MD;  Location: [place];  Service: General;  Laterality: N/A;     . PARTIAL COLECTOMY  02/06/2012  . PARTIAL COLECTOMY  02/06/2012   Procedure: PARTIAL COLECTOMY;  Surgeon: Imogene Burn. [name], MD;  Location: [place];  Service: General;  Laterality: N/A;  right partial colectomy  . POSTERIOR CERVICAL FUSION/FORAMINOTOMY N/A 01/06/2020   Procedure: CERVICAL FOUR- THORACIC TWO LAMINECTOMY AND FUSION;  Surgeon: Consuella Lose, MD;  Location: [place];  Service: Neurosurgery;  Laterality: N/A;  CERVICAL FOUR- THORACIC TWO LAMINECTOMY AND FUSION  . RESECTION SMALL BOWEL / CLOSURE ILEOSTOMY  402/2014   Dr [name]  . VACUUM ASSISTED CLOSURE CHANGE  02/20/2012   Procedure: ABDOMINAL VACUUM ASSISTED CLOSURE CHANGE;  Surgeon: Odis Hollingshead, MD;  Location: [place];  Service: General;;  . VACUUM ASSISTED CLOSURE CHANGE  02/23/2012   Procedure: ABDOMINAL VACUUM ASSISTED CLOSURE CHANGE;  Surgeon: Joyice Faster. Cornett, MD;  Location: [place];  Service: General;  Laterality: N/A;   HPI:  Rodney Apelles Dameir Gentzler. is a 69 y.o. male with medical history significant of GERD, hypertension, hyperlipidemia ESRD on hemodialysis, type 2 diabetes mellitus, anemia of chronic disease, colon cancer status post resection, epidural abscess status post laminectomy, fusion on 01/06/2020 and MRSA bacteremia and presented emergently for evaluation of confusion and COVID-19 positive. Initial BSE 02/21/20 was limited- needed enouragement, audible swallow, eructation, poor dentition and reduced endurance, Work of breathing was stable. Dys 2/thin recommended. On 9/22 became unresponsive after HD, transferred to ICU and intubated then extubated 9/25. Currently has NGT, encephalopathy improving per MD note and speech to re-eval. CXR Bilateral basilar infiltrates suggesting multifocal pneumonia.   Assessment / Plan /  Recommendation Clinical Impression  SLP familiar with pt from previous BSE. SInce then he had a transfer to ICU with 5 day intubation (unresponsive after HD), His encephalopathy has improved and ST ordered to reassess swallow function. Pt demonstrated impulsivity with bite and sip size needed assist to feed and regulate volumes. Decreased labial closure was present but no outward signs of aspiraiton with multiple sips thin, thick, puree and solid textures. At end of assessment his O2 saturation fell to mid 80's where it remained until RN placed pt back on O2. An MBS is recommended prior to initiation of po's to assess full function and sensation. MBS planned for tomorrow afternoon.   SLP Visit Diagnosis: Dysphagia, unspecified (R13.10)    Aspiration Risk  Mild aspiration risk;Moderate aspiration risk    Diet Recommendation NPO   Medication Administration: Via alternative means    Other  Recommendations Oral Care Recommendations: Oral care QID   Follow up Recommendations Other (comment) (TBD)      Frequency and Duration            Prognosis  Swallow Study   General HPI: Rodney Apelles Laron Boorman. is a 69 y.o. male with medical history significant of GERD, hypertension, hyperlipidemia ESRD on hemodialysis, type 2 diabetes mellitus, anemia of chronic disease, colon cancer status post resection, epidural abscess status post laminectomy, fusion on 01/06/2020 and MRSA bacteremia and presented emergently for evaluation of confusion and COVID-19 positive. Initial BSE 02/21/20 was limited- needed enouragement, audible swallow, eructation, poor dentition and reduced endurance, Work of breathing was stable. Dys 2/thin recommended. On 9/22 became unresponsive after HD, transferred to ICU and intubated then extubated 9/25. Currently has NGT, encephalopathy improving per MD note and speech to re-eval. CXR Bilateral basilar infiltrates suggesting multifocal pneumonia. Type of Study: Bedside Swallow  Evaluation Previous Swallow Assessment:  (see BSE) Diet Prior to this Study: NPO Temperature Spikes Noted: No Respiratory Status: Nasal cannula (off O2 for eval, placed back on after) History of Recent Intubation: Yes (5 days) Length of Intubations (days): 5 days Date extubated: 02/26/20 Behavior/Cognition: Cooperative;Alert;Pleasant mood;Impulsive;Requires cueing Oral Care Completed by SLP: No Oral Cavity - Dentition: Missing dentition Vision: Functional for self-feeding Baseline Vocal Quality: Normal Volitional Cough: Strong Volitional Swallow: Able to elicit    Oral/Motor/Sensory Function Overall Oral Motor/Sensory Function: Within functional limits   Ice Chips Ice chips: Not tested   Thin Liquid Thin Liquid: Impaired Presentation: Cup;Straw Oral Phase Impairments: Reduced labial seal Pharyngeal  Phase Impairments:  (none)    Nectar Thick Nectar Thick Liquid: Impaired Presentation: Cup Oral Phase Impairments: Reduced labial seal Pharyngeal Phase Impairments:  (none)   Honey Thick     Puree Puree: Within functional limits   Solid     Solid: Impaired Oral Phase Impairments: Reduced lingual movement/coordination Pharyngeal Phase Impairments:  (non)      Mick Sell, Orbie Pyo 03/02/2020,5:05 PM  Orbie Pyo [place].Ed Risk analyst [phone] Office [phone]

## 2020-03-02 NOTE — Progress Notes (Signed)
Occupational Therapy Treatment Patient Details Name: [name]. MRN: [id number] DOB: [birth year] Today's Date: 03/02/2020    History of present illness Pt is a 69 y.o. male with d/c to SNF after prolonged complicated admission s/p C4-T2 laminectomy fusion (01/06/20), now admitted from [facility] 02/20/20 with confusion. Workup for metabolic encephalopathy, shock hypoxia; (+) COVID-19. 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 ETT 9/22-9/25. PMH includes HTN, HLD, ESRD (HD MWF), colon CA s/p resection.   OT comments  Pt progressing slowly towards OT goals, agreeable to work with therapies today. Pt continues with inconsistent command following and perseverating on pulling cortrak line during bed level ADLs, requiring frequent cues for redirection. Pt Total A for pericare bed level. Pt overall Max A x 2 for bed mobility, utilized maximove lift for transfer to chair with improving alertness noted. SpO2 stable throughout on RA during session. Plan to continue OT POC to improve ADL independence.    Follow Up Recommendations  SNF;Supervision/Assistance - 24 hour    Equipment Recommendations  None recommended by OT    Recommendations for Other Services      Precautions / Restrictions Precautions Precautions: Fall;Other (comment) Precaution Comments: Rectal tube, cortrak; bilateral soft mitts Restrictions Weight Bearing Restrictions: No       Mobility Bed Mobility Overal bed mobility: Needs Assistance Bed Mobility: Rolling Rolling: Max assist;+2 for physical assistance;+2 for safety/equipment         General bed mobility comments: Roll R/L for pericare and maximove pad placement; pt initiating BLE movement, although inconsistently to command, max multimodal cues to assist with BUE support on bed rails  Transfers Overall transfer level: Needs assistance               General transfer comment: Transfer to recliner totalA with  maximove lift, pt tolerated well    Balance Overall balance assessment: Needs assistance   Sitting balance-Leahy Scale: Zero Sitting balance - Comments: Pt with minimal initiation of anterior weight translation to edge of recliner requiring maxA+2 to totalA to maintain upright balance                                   ADL either performed or assessed with clinical judgement   ADL Overall ADL's : Needs assistance/impaired     Grooming: Modified independent;Bed level;Wash/dry face Grooming Details (indicate cue type and reason): Mod A overall to wash face bed level due to decreased attention to task and attempting to pull out cortrak              Lower Body Dressing: Total assistance;Bed level Lower Body Dressing Details (indicate cue type and reason): Total A to don socks bed level     Toileting- Clothing Manipulation and Hygiene: Total assistance;Bed level Toileting - Clothing Manipulation Details (indicate cue type and reason): Total A for peri care clean up after minor rectal tube leakage       General ADL Comments: Pt continues to require extensive assist for ADLs due to cognition and physical debility      Vision   Vision Assessment?: No apparent visual deficits   Perception     Praxis      Cognition Arousal/Alertness: Awake/alert;Lethargic Behavior During Therapy: Flat affect Overall Cognitive Status: No family/caregiver present to determine baseline cognitive functioning Area of Impairment: Attention;Memory;Following commands;Safety/judgement;Awareness;Problem solving  Current Attention Level: Focused Memory: Decreased short-term memory Following Commands: Follows one step commands with increased time;Follows one step commands inconsistently Safety/Judgement: Decreased awareness of safety;Decreased awareness of deficits Awareness: Intellectual Problem Solving: Slow processing;Difficulty sequencing;Requires verbal  cues;Requires tactile cues General Comments: increased time and cueing to follow basic mobility commands. Pt attempting to pull at lines, requiring frequent redirection. Fluctuating between eyes open and falling asleep; more alert once up in chair.        Exercises     Shoulder Instructions       General Comments Pt on 2 L O2 on entry, removed and sustained at 100% on RA    Pertinent Vitals/ Pain       Pain Assessment: Faces Faces Pain Scale: No hurt Pain Location: sores on peri area Pain Descriptors / Indicators: Grimacing Pain Intervention(s): Monitored during session;Repositioned  Home Living                                          Prior Functioning/Environment              Frequency  Min 2X/week        Progress Toward Goals  OT Goals(current goals can now be found in the care plan section)  Progress towards OT goals: Progressing toward goals  Acute Rehab OT Goals Patient Stated Goal: Unstated OT Goal Formulation: Patient unable to participate in goal setting Time For Goal Achievement: 03/06/20 Potential to Achieve Goals: Good ADL Goals Pt Will Perform Eating: with set-up;with supervision;sitting;bed level Pt Will Perform Grooming: with set-up;with supervision;sitting;bed level Additional ADL Goal #1: Pt will perform bed mobility with Min A in preparation for ADLs Additional ADL Goal #2: Pt will follow one step commands during ADLS 42% of time with Min cues Additional ADL Goal #3: Pt will maintain sitting balance at EOB with MIn A in preparation for ADLs  Plan Discharge plan remains appropriate    Co-evaluation    PT/OT/SLP Co-Evaluation/Treatment: Yes Reason for Co-Treatment: For patient/therapist safety;Necessary to address cognition/behavior during functional activity;To address functional/ADL transfers PT goals addressed during session: Mobility/safety with mobility OT goals addressed during session: ADL's and self-care       AM-PAC OT "6 Clicks" Daily Activity     Outcome Measure   Help from another person eating meals?: Total (cortrak) Help from another person taking care of personal grooming?: A Lot Help from another person toileting, which includes using toliet, bedpan, or urinal?: Total Help from another person bathing (including washing, rinsing, drying)?: A Lot Help from another person to put on and taking off regular upper body clothing?: A Lot Help from another person to put on and taking off regular lower body clothing?: Total 6 Click Score: 9    End of Session Equipment Utilized During Treatment: Other (comment) (maximove)  OT Visit Diagnosis: Unsteadiness on feet (R26.81);Other abnormalities of gait and mobility (R26.89);Muscle weakness (generalized) (M62.81)   Activity Tolerance Patient limited by fatigue   Patient Left in chair;with call bell/phone within reach   Nurse Communication Mobility status        Time: 8768-1157 OT Time Calculation (min): 35 min  Charges: OT General Charges $OT Visit: 1 Visit OT Treatments $Self Care/Home Management : 8-22 mins  Layla Maw, OTR/L   Layla Maw 03/02/2020, 3:31 PM

## 2020-03-03 ENCOUNTER — Inpatient Hospital Stay (HOSPITAL_COMMUNITY): Payer: No Typology Code available for payment source

## 2020-03-03 DIAGNOSIS — D638 Anemia in other chronic diseases classified elsewhere: Secondary | ICD-10-CM | POA: Diagnosis not present

## 2020-03-03 DIAGNOSIS — D696 Thrombocytopenia, unspecified: Secondary | ICD-10-CM | POA: Diagnosis not present

## 2020-03-03 DIAGNOSIS — G9341 Metabolic encephalopathy: Secondary | ICD-10-CM | POA: Diagnosis not present

## 2020-03-03 LAB — CBC
HCT: 24.2 % — ABNORMAL LOW (ref 39.0–52.0)
Hemoglobin: 7.6 g/dL — ABNORMAL LOW (ref 13.0–17.0)
MCH: 27 pg (ref 26.0–34.0)
MCHC: 31.4 g/dL (ref 30.0–36.0)
MCV: 86.1 fL (ref 80.0–100.0)
Platelets: 62 10*3/uL — ABNORMAL LOW (ref 150–400)
RBC: 2.81 MIL/uL — ABNORMAL LOW (ref 4.22–5.81)
RDW: 22.8 % — ABNORMAL HIGH (ref 11.5–15.5)
WBC: 8.8 10*3/uL (ref 4.0–10.5)
nRBC: 0.2 % (ref 0.0–0.2)

## 2020-03-03 LAB — GLUCOSE, CAPILLARY
Glucose-Capillary: 150 mg/dL — ABNORMAL HIGH (ref 70–99)
Glucose-Capillary: 113 mg/dL — ABNORMAL HIGH (ref 70–99)
Glucose-Capillary: 140 mg/dL — ABNORMAL HIGH (ref 70–99)
Glucose-Capillary: 181 mg/dL — ABNORMAL HIGH (ref 70–99)
Glucose-Capillary: 148 mg/dL — ABNORMAL HIGH (ref 70–99)

## 2020-03-03 LAB — C-REACTIVE PROTEIN: CRP: 10.7 mg/dL — ABNORMAL HIGH (ref <1.0)

## 2020-03-03 LAB — COMPREHENSIVE METABOLIC PANEL
ALT: 11 U/L (ref 0–44)
AST: 28 U/L (ref 15–41)
Albumin: 1.6 g/dL — ABNORMAL LOW (ref 3.5–5.0)
Alkaline Phosphatase: 92 U/L (ref 38–126)
Anion gap: 10 (ref 5–15)
BUN: 57 mg/dL — ABNORMAL HIGH (ref 8–23)
CO2: 23 mmol/L (ref 22–32)
Calcium: 7.9 mg/dL — ABNORMAL LOW (ref 8.9–10.3)
Chloride: 103 mmol/L (ref 98–111)
Creatinine, Ser: 7.54 mg/dL — ABNORMAL HIGH (ref 0.61–1.24)
GFR calc Af Amer: 8 mL/min — ABNORMAL LOW (ref >60)
GFR calc non Af Amer: 7 mL/min — ABNORMAL LOW (ref >60)
Glucose, Bld: 184 mg/dL — ABNORMAL HIGH (ref 70–99)
Potassium: 4.2 mmol/L (ref 3.5–5.1)
Sodium: 136 mmol/L (ref 135–145)
Total Bilirubin: 1.1 mg/dL (ref 0.3–1.2)
Total Protein: 5.5 g/dL — ABNORMAL LOW (ref 6.5–8.1)

## 2020-03-03 LAB — APTT
aPTT: 57 seconds — ABNORMAL HIGH (ref 24–36)
aPTT: 55 seconds — ABNORMAL HIGH (ref 24–36)

## 2020-03-03 LAB — D-DIMER, QUANTITATIVE: D-Dimer, Quant: 6.16 ug/mL-FEU — ABNORMAL HIGH (ref 0.00–0.50)

## 2020-03-03 MED ORDER — INSULIN ASPART 100 UNIT/ML ~~LOC~~ SOLN: 0.0000 [IU] | Freq: Every day | SUBCUTANEOUS | Status: DC

## 2020-03-03 MED ORDER — CALCITRIOL 0.5 MCG PO CAPS
ORAL_CAPSULE | ORAL | Status: AC
  Administered 2020-03-03: 1.5 ug
  Filled 2020-03-03: qty 3

## 2020-03-03 MED ORDER — INSULIN ASPART 100 UNIT/ML ~~LOC~~ SOLN
0.0000 [IU] | Freq: Three times a day (TID) | SUBCUTANEOUS | Status: DC
  Administered 2020-03-04: 1 [IU] via SUBCUTANEOUS
  Administered 2020-03-05 – 2020-03-09 (×2): 2 [IU] via SUBCUTANEOUS
  Administered 2020-03-13 – 2020-03-14 (×2): 1 [IU] via SUBCUTANEOUS
  Administered 2020-03-16 – 2020-03-17 (×2): 2 [IU] via SUBCUTANEOUS

## 2020-03-03 MED ORDER — INSULIN ASPART 100 UNIT/ML ~~LOC~~ SOLN: 0.0000 [IU] | Freq: Three times a day (TID) | SUBCUTANEOUS | Status: DC

## 2020-03-03 MED ORDER — VANCOMYCIN HCL IN DEXTROSE 1-5 GM/200ML-% IV SOLN
INTRAVENOUS | Status: AC
  Administered 2020-03-03: 1000 mg via INTRAVENOUS
  Filled 2020-03-03: qty 200

## 2020-03-03 MED ORDER — RESOURCE THICKENUP CLEAR PO POWD
ORAL | Status: DC | PRN
  Filled 2020-03-03 (×2): qty 125

## 2020-03-03 NOTE — Progress Notes (Signed)
Per pharmacy, keep Angiomax gtt at current rate. Waiting on APTT lab draw, pt is difficult stick and HD pt. MD is aware. Will continue to monitor.

## 2020-03-03 NOTE — Progress Notes (Signed)
[facility] Progress Note  Subjective:   Hasn't had labs yet today.  Last HD on 9/29 with 0.2 kg UF.  He is about to go to HD soon.  Review of systems:    denies overt shortness of breath, chest pain, nausea or vomiting - more alert/reliable   Vitals:   03/02/20 2016 03/03/20 0034 03/03/20 0411 03/03/20 0437  BP:    (!) 162/72  Pulse:    87  Resp: 20 20  20   Temp: 98.5 F (36.9 C)   97.8 F (36.6 C)  TempSrc: Oral   Axillary  SpO2:    100%  Weight:   89 kg   Height:        Exam:  adult male in bed in NAD   NCAT  Chest clear but reduced anteriorly unlabored at rest  S1S2 no rub   Abd soft nt obese habitus   Ext no LE edema; has mittens still    Neuro awake on arrival; oriented to person ; states is 2019; states normally goes to HD MWF - unsure of center name Psych - no agitation or anxiety on my exam      R IJ TDC and L arm AVF+bruit and thrill      CXR 9/23 - patchy perihilar/ basilar dz, no edema      OP HD: [facility] MWF   4h 25min  87kg  2/2 bath  TDC / L AVF  Hep none  -Mircera 150 mcg IV q 2 weeks (last dose 02/09/2020) -Venofer 100 mg IV X 8 doses (not started yet) -Calcitriol 1.5 mcg PO TIW  Assessment/ Plan: 1. COVID 19-recent diagnosis. Tested positive at [facility] 09/18 and again here.  Per primary team. 2. Acute hypoxic resp failure; extubated; per critical care; optimize volume status with HD 3. AMS - agitation - is better; per primary 4. Recent epidural abscess/MRSA cervical discitis-  S/p decompression surgery 01/06/20. S/p Vancomycin and Rifampin here through 10/01 per charting but rifampin off due to LFT's per charting.  Now on vanc and s/p cefazolin. abx per primary team discretion.  5.  ESRD - MWF HD schedule. Await today's labs.  Gentle UF with HD as tolerated  6.  HD access - L BC AVF placed Feb 2021. Was used starting in May and had infiltration in Aug prompting [facility] placement to rest the AVF. Using [facility] here.  7.  Hypertension/volume - optimize  volume with HD. Note midodrine. On 9/28, decreased from 20 mg TID to 10 mg TID and ultimately later to 5 mg TID and now have stopped midodrine.  Per med list reportedly on anti-htn's at home. Gentle UF with HD. 8. Anemia  - aranesp increased to 150 mcg for 9/29 onward for now. Hold OP Fe load.  s/p PRBC's 9/29 9.  Metabolic bone disease - off renvela for hypophosphatemia and improved. Anticipate need to resume at a lower dose per trends. on calcitriol 10.  DM-per primary    Recent Labs  Lab 02/29/20 1830 03/01/20 0257 03/01/20 0742 03/01/20 0742 03/01/20 1750 03/02/20 1202  K  --    < > 4.2  --   --  4.2  BUN  --   --  64*  --   --  45*  CREATININE  --   --  7.70*  --   --  6.04*  CALCIUM  --   --  7.6*  --   --  7.9*  PHOS 2.0*  --   --   --   --  2.5  HGB  --    < > 6.8*   < > 9.1* 8.2*   < > = values in this interval not displayed.   Inpatient medications: . calcitRIOL      . calcitRIOL  1.5 mcg Per Tube Q M,W,F-1800  . chlorhexidine  15 mL Mouth Rinse BID  . Chlorhexidine Gluconate Cloth  6 each Topical Q0600  . Chlorhexidine Gluconate Cloth  6 each Topical Q0600  . Chlorhexidine Gluconate Cloth  6 each Topical Q0600  . darbepoetin (ARANESP) injection - DIALYSIS  150 mcg Intravenous Q Wed-HD  . [START ON 03/04/2020] doxycycline  100 mg Oral Q12H  . feeding supplement (PROSource TF)  90 mL Per Tube BID  . insulin aspart  0-9 Units Subcutaneous Q4H  . insulin aspart  4 Units Subcutaneous Q4H  . insulin detemir  5 Units Subcutaneous BID  . mouth rinse  15 mL Mouth Rinse q12n4p  . multivitamin  1 tablet Per Tube QHS  . pantoprazole sodium  40 mg Per Tube Daily  . QUEtiapine  12.5 mg Oral QHS  . sodium chloride flush  3 mL Intravenous Q12H  . Zinc Oxide   Topical TID  . zinc sulfate  220 mg Per Tube Daily   . vancomycin    . sodium chloride    . sodium chloride    . sodium chloride Stopped (02/21/20 2227)  . sodium chloride    . bivalirudin (ANGIOMAX) infusion 0.5 mg/mL  (Non-ACS indications) 0.05 mg/kg/hr (03/03/20 0456)  . feeding supplement (VITAL 1.5 CAL) 40 mL/hr at 03/03/20 0530  . vancomycin 1,000 mg (03/01/20 1018)   sodium chloride, sodium chloride, sodium chloride, sodium chloride, alteplase, fentaNYL (SUBLIMAZE) injection, haloperidol lactate, lidocaine (PF), lidocaine-prilocaine, lip balm, metoprolol tartrate, ondansetron **OR** ondansetron (ZOFRAN) IV, pentafluoroprop-tetrafluoroeth, sodium chloride flush   Claudia Desanctis, MD 03/03/2020  7:41 AM

## 2020-03-03 NOTE — Progress Notes (Signed)
[place] for bivalirudin Indication: R/o HIT  Allergies  Allergen Reactions  . Heparin     R/o HIT with ab/SRA    Patient Measurements: Height: 6' (182.9 cm) Weight: 88 kg (194 lb 0.1 oz) IBW/kg (Calculated) : 77.6  Vital Signs: Temp: 98.6 F (37 C) (10/01 1453) Temp Source: Oral (10/01 1453) BP: 143/69 (10/01 1453) Pulse Rate: 97 (10/01 1207)  Labs: Recent Labs    03/01/20 0742 03/01/20 0742 03/01/20 1750 03/01/20 1750 03/02/20 1202 03/03/20 0929 03/03/20 1554  HGB 6.8*   < > 9.1*   < > 8.2* 7.6*  --   HCT 21.2*   < > 29.3*  --  26.2* 24.2*  --   PLT 74*  --   --   --  62* 62*  --   APTT  --   --   --   --   --  57* 55*  CREATININE 7.70*  --   --   --  6.04* 7.54*  --    < > = values in this interval not displayed.    Estimated Creatinine Clearance: 10.3 mL/min (A) (by C-G formula based on SCr of 7.54 mg/dL (H)).   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 His rifampin was dced after significant increase with LFTs. They have trended down to WNL. He was transferred out from the ICU. His platelets have trended down significantly with a calculated 4T score of 5 (intermediate). Pharmacist discussed with Dr Sloan Leiter on 9/30 and decided to DC any heparin products and start him on the HIT protocol with bivalirudin.   CALCULATE SCORE:  4Ts (see the HIT Algorithm) Score  Thrombocytopenia 2  Timing 2  Thrombosis 0  Other causes of thrombocytopenia 1  Total 5   Possible HIT    Order SRA:  Yes  Discontinue heparin / LMWH:  Yes  Initiate alternative anticoagulation:  Yes  Document heparin allergy:  Yes  PM update: APTT therapeutic x 2; no bleeding reported.  Goal of Therapy:  aPTT goal: 50-85 sec Monitor platelets by anticoagulation protocol: Yes   Plan:  Continue bivalirudin gtt at 0.05mg /kg/min F/U AM labs  Sonyia Muro D. Mina Marble, PharmD, BCPS, [place] 03/03/2020, 4:51 PM

## 2020-03-03 NOTE — Progress Notes (Signed)
ANTICOAGULATION CONSULT NOTE - follow up Pharmacy Consult for bivalirudin Indication: R/o HIT  Allergies  Allergen Reactions  . Heparin     R/o HIT with ab/SRA    Patient Measurements: Height: 6' (182.9 cm) Weight: 88 kg (194 lb 0.1 oz) IBW/kg (Calculated) : 77.6 Heparin Dosing Weight: 82kg  Vital Signs: Temp: 98.5 F (36.9 C) (10/01 1130) Temp Source: Axillary (10/01 1130) BP: 171/69 (10/01 1130) Pulse Rate: 94 (10/01 1130)  Labs: Recent Labs    03/01/20 0742 03/01/20 0742 03/01/20 1750 03/01/20 1750 03/02/20 1202 03/03/20 0929  HGB 6.8*   < > 9.1*   < > 8.2* 7.6*  HCT 21.2*   < > 29.3*  --  26.2* 24.2*  PLT 74*  --   --   --  62* 62*  APTT  --   --   --   --   --  57*  CREATININE 7.70*  --   --   --  6.04* 7.54*   < > = values in this interval not displayed.    Estimated Creatinine Clearance: 10.3 mL/min (A) (by C-G formula based on SCr of 7.54 mg/dL (H)).   Medical History: Past Medical History:  Diagnosis Date  . Anemia   . Arthritis    left knee  . Chronic kidney disease    acute renal failure/injury requiring short-term HD 2013  . Colon cancer ([place])   . Colon polyp    11/2011 - Polyps identified, biopsy - invasive adenocarinoma  . DM II (diabetes mellitus, type II), controlled ([place])    type 2 IDDM x 15 years. A1C 1/09 13.7.   Marland Kitchen 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 12/25/2011  . GERD (gastroesophageal reflux disease)   . Heart murmur   . 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 Hyperlipidemia   . Hypertension    for a few years and resolved in 2013/2014.  . Sleep apnea    does not wear CPAP now  . Wears glasses     Medications:  Medications Prior to Admission  Medication Sig Dispense Refill Last Dose  . amLODipine (NORVASC) 10 MG tablet Take 1 tablet (10 mg total) by mouth at bedtime. 30 tablet 0 02/19/2020 at 2100  . atorvastatin (LIPITOR) 20 MG tablet Take 1 tablet (20 mg total) by mouth at bedtime. 30 tablet 0 02/19/2020 at 2100  .  [EXPIRED] calcitRIOL (ROCALTROL) 0.25 MCG capsule Take 5 capsules (1.25 mcg total) by mouth 3 (three) times a week. (Patient taking differently: Take 1.25 mcg by mouth every Monday, Wednesday, and Friday. ) 60 capsule 0 02/18/2020 at 0800  . clonazePAM (KLONOPIN) 0.5 MG tablet Take 1 tablet (0.5 mg total) by mouth 2 (two) times daily as needed (Anxiety/agitation). 30 tablet 0 UNK  . gabapentin (NEURONTIN) 100 MG capsule Take 100 mg by mouth at bedtime. Left arm pain for 28 days (ends 9.29.2021)   02/19/2020  . HYDROcodone-acetaminophen (NORCO/VICODIN) 5-325 MG tablet Take 1 tablet by mouth every 6 (six) hours as needed for moderate pain.   02/20/2020 at Unknown time  . insulin detemir (LEVEMIR) 100 UNIT/ML injection Inject 0.11 mLs (11 Units total) into the skin 2 (two) times daily. 6.6 mL 0 02/20/2020  . metoprolol tartrate (LOPRESSOR) 25 MG tablet Take 1 tablet (25 mg total) by mouth 2 (two) times daily. 60 tablet 0 02/20/2020 at 0800  . Multiple Vitamin (MULTIVITAMIN WITH MINERALS) TABS tablet Take 1 tablet by mouth daily.   02/19/2020  . polyethylene glycol (MIRALAX /  GLYCOLAX) 17 g packet Take 17 g by mouth daily as needed for mild constipation.   UNK  . QUEtiapine (SEROQUEL) 25 MG tablet Take 0.5 tablets (12.5 mg total) by mouth at bedtime. 15 tablet 0 02/19/2020  . rifampin (RIFADIN) 300 MG capsule Take 1 capsule (300 mg total) by mouth every 12 (twelve) hours. 78 capsule 0 02/20/2020 at Unknown time  . [EXPIRED] sevelamer carbonate (RENVELA) 800 MG tablet Take 2 tablets (1,600 mg total) by mouth 3 (three) times daily with meals. 180 tablet 0 02/20/2020 at Unknown time  . Darbepoetin Alfa (ARANESP) 100 MCG/0.5ML SOSY injection Inject 0.5 mLs (100 mcg total) into the vein every Monday with hemodialysis. (Patient not taking: Reported on 02/21/2020) 4.2 mL  Not Taking at Unknown time  . [EXPIRED] multivitamin (RENA-VIT) TABS tablet Take 1 tablet by mouth at bedtime. (Patient not taking: Reported on 02/21/2020)  30 tablet 0 Not Taking at Unknown time   Scheduled:  . calcitRIOL  1.5 mcg Per Tube Q M,W,F-1800  . chlorhexidine  15 mL Mouth Rinse BID  . Chlorhexidine Gluconate Cloth  6 each Topical Q0600  . Chlorhexidine Gluconate Cloth  6 each Topical Q0600  . Chlorhexidine Gluconate Cloth  6 each Topical Q0600  . darbepoetin (ARANESP) injection - DIALYSIS  150 mcg Intravenous Q Wed-HD  . [START ON 03/04/2020] doxycycline  100 mg Oral Q12H  . feeding supplement (PROSource TF)  90 mL Per Tube BID  . insulin aspart  0-9 Units Subcutaneous Q4H  . insulin aspart  4 Units Subcutaneous Q4H  . insulin detemir  5 Units Subcutaneous BID  . mouth rinse  15 mL Mouth Rinse q12n4p  . multivitamin  1 tablet Per Tube QHS  . pantoprazole sodium  40 mg Per Tube Daily  . QUEtiapine  12.5 mg Oral QHS  . sodium chloride flush  3 mL Intravenous Q12H  . Zinc Oxide   Topical TID  . zinc sulfate  220 mg Per Tube Daily   Infusions:  . sodium chloride    . sodium chloride    . sodium chloride Stopped (02/21/20 2227)  . sodium chloride    . bivalirudin (ANGIOMAX) infusion 0.5 mg/mL (Non-ACS indications) 0.05 mg/kg/hr (03/03/20 0456)  . feeding supplement (VITAL 1.5 CAL) 40 mL/hr at 03/03/20 0530  . vancomycin    . 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 His rifampin was dced after significant increase with LFTs. They have trended down to WNL. He was transferred out from the ICU. His platelets have trended down significantly with a calculated 4T score of 5 (intermediate). Pharmacist discussed with Dr Sloan Leiter on 9/30 and decided to DC any heparin products and start him on the HIT protocol with bivalirudin.   Plt wnl 9/19-9/24>> greater than 50% drop starting 9/26.  ESRD HIT labs were ordered on 9/30 when platelets dropped to 62.   CALCULATE SCORE:  4Ts (see the HIT Algorithm) Score  Thrombocytopenia 2  Timing 2  Thrombosis 0  Other causes of  thrombocytopenia 1  Total 5   Possible HIT    Order SRA:  Yes  Discontinue heparin / LMWH:  Yes  Initiate alternative anticoagulation:  Yes  Document heparin allergy:  Yes  Multiple attempts to draw aPTT lab overnight 9/30-10/1, difficult stick >unsuccessful.   Had to wait to draw labs in hemdialyosis this AM 10/1.   APTT = 57 seconds on Bivalirudin 0.05 mcg/kg/min Therapeutic aPTT.  No bleeding reported. PLTC =62K   Goal of Therapy:  aPTT goal: 50-85 Monitor platelets by anticoagulation protocol: Yes   Plan:  Continue IV Bivalirudin 0.05mg /kg/min Check aPTT in 4hr  Daily aPTT and CBC   Thank you for allowing pharmacy to be part of this patients care team.  Nicole Cella, [place] Pharmacist [phone] Please check AMION for all [place] phone numbers After 10:00 PM, call [place] 03/03/2020 11:46 AM

## 2020-03-03 NOTE — Progress Notes (Signed)
Modified Barium Swallow Progress Note  Patient Details  Name: [name]. MRN: [id number] Date of Birth: 09-13-1950  Today's Date: 03/03/2020  Modified Barium Swallow completed.  Full report located under Chart Review in the Imaging Section.  Brief recommendations include the following:  Clinical Impression  Pt demonstrates mild orpharyngeal dysphagia mostly related to anatomical differences. 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 In addition, initiation of swallow is intermittently late with boluses reaching the pyriform sinuses for up to 5 seconds prior to swallow. His epiglottis at times does not fully invert adding to inadequate protection. Thin barium was penetrated before and during the swallow remaining superior to the vocal cords. His reflexive throat clear, present most of the time did not clear vestibule and a chin tuck was not effective. Penetration was present once with nectar at end of study. Nectar thick recommended for pt to initiate diet and upgrade to thin from clinical observation with SLP. Dys 2 texture recommended, crush pills, small sips/bites, intermittent throat clear and avoid straws.      Swallow Evaluation Recommendations       SLP Diet Recommendations: Dysphagia 2 (Fine chop) solids;Nectar thick liquid   Liquid Administration via: Cup   Medication Administration: Crushed with puree   Supervision: Patient able to self feed;Full supervision/cueing for compensatory strategies   Compensations: Slow rate;Small sips/bites;Clear throat intermittently   Postural Changes: Seated upright at 90 degrees   Oral Care Recommendations: Oral care BID   Other Recommendations: Order thickener from pharmacy    Houston Siren 03/03/2020,4:36 PM  Orbie Pyo Colvin Caroli.Ed Risk analyst [phone] Office [phone]

## 2020-03-03 NOTE — Progress Notes (Signed)
PROGRESS NOTE                                                                                                                                                                                                             Patient Demographics:    [name], is a 69 y.o. male, DOB - Sep 28, 1950, HMC:[id number]  Outpatient Primary MD for the patient is [facility], [facility]   Admit date - 02/20/2020   LOS - 26  Chief Complaint  Patient presents with   Covid Positive   AMS       Brief Narrative: Patient is a 69 y.o. male with PMHx of ESRD on HD MWF, DM-2, MRSA bacteremia with epidural abscess s/p C4-T2 laminectomy/fusion on 01/06/2020-on IV vancomycin/rifampin through 10/1-resident of SNF-apparently tested positive for COVID-19 on 9/18-subsequently brought to the ED on 9/19 for worsening confusion (more than baseline)-fever-thought to have acute metabolic encephalopathy from COVID-19 infection and admitted to the hospitalist service.  Further hospital course complicated by development of hypotension/severe encephalopathy requiring intubation and ICU transfer.  See below for further details.  COVID-19 vaccinated status: Unvaccinated  Significant Events: 8/3-8/23>> hospitalized for epidural abscess/MRSA bacteremia-underwent laminectomy-briefly intubated due to altered mental status.  Discharged to SNF 9/18>> COVID-19 positive at SNF 9/19>> Admit to [facility] for fever/confusion 9/22>> obtunded/hypotensive after coming back from hemodialysis-emergently intubated/transferred to ICU 9/30>> transferred back to [facility] 9/30>> SQ heparin stopped-pharmacy consulted for HIT protocol.  Significant studies: 8/6>> Echo: EF> 75% 9/19>>Chest x-ray: No acute abnormality in the lung. 9/19>> CT head: No acute intracranial abnormality 9/19>> CT C-spine: Postoperative findings of posterior laminectomy/fusion C4-T1, prevertebral soft tissue edema  appears reduced, postoperative fluid collection overlies laminectomy site does not appear significantly changed compared to prior examination. 9/20>> MRI C-spine: Attempted but due to AMS-not completed 9/21>> MRI C Spine: Osteomyelitis of skull base, anterior C1 and odontoid, C2-C3 facet joint-associated erosion of bone since 8/27-resolved epidural abscess 9/22>> coarsened/heterogeneous appearance of the liver parenchyma-suspicious for cirrhosis, gallbladder not visualized. 9/22>> bilateral lower extremity Doppler: No DVT 9/29>> chest x-ray: Bibasilar infiltrates suggesting multifocal pneumonia  COVID-19 medications: Remdesivir: 9/19>> 9/21  Antibiotics: IV vancomycin plan to continue through October 1 (rifampin stopped 9/21 due to elevated LFTs) Ancef: 9/26>> Zosyn: 9/24>> 9/26  Microbiology data: 9/20 >>blood culture: No growth so far 9/21>> blood culture: No growth so far 9/24>> sputum  culture: E. coli/MSSA  Procedures: ET tube 9/22 > 9/25  Consults: Nephrology PCCM GI  DVT prophylaxis: heparin injection 5,000 Units Start: 02/20/20>> stopped on 9/30 due to worsening thrombocytopenia. SCDs Start: 02/20/20 1628   Subjective:   Patient seen at hemodialysis.  Patient denies any complaints.  Denies shortness of breath or any pain issues at this time.   Assessment  & Plan :   Acute hypoxic respiratory failure secondary to HCAP (E. coli/MSSA in sputum culture) and COVID-19 pneumonia Initially admitted and thought to have mild disease-started on steroid/Remdesivir-but decompensated on 9/22-requiring intubation and ICU transfer.  Extubated 9/26-culture results as above-on IV Ancef with end date of 10/1.  No longer on Remdesivir-stopped on 9/22 due to significantly elevated LFTs.    Respiratory status seems to be stable.  He is currently saturating normal on room air.  COVID-19 Labs: Recent Labs    03/02/20 1202 03/03/20 0929  DDIMER  --  6.16*  CRP 11.7* 10.7*    Septic  shock, resolved Required pressors while in the ICU-thought to be most likely secondary to [place] patient has a history of recent MRSA bacteremia/cervical spine osteomyelitis/abscess-this appears to be quiescent and stable.  IV antibiotic/culture data as above.  Acute metabolic encephalopathy Multifactorial etiology-on initial presentation to the hospital was felt to be due to uremia (due to under dialysis-patient got short outpatient HD numerous times), sepsis physiology from COVID-19-however his mental status markedly deteriorated on 9/22-likely due to worsening sepsis physiology from HCAP.  Post extubation-has had issues with delirium/likely related to intubation/ICU stay.  Required Precedex while in the ICU.  Seems to have improved.  Slow to respond but answers questions appropriately.  Remains on low-dose Seroquel.   He apparently has short-term memory issues-there was some thought as to whether patient has underlying dementia-has a history of alcohol/cocaine use in the past.  Dysphagia: Currently with a NG tube in place-NG feedings ongoing.  Suspect this is mostly due to severe encephalopathy post extubation.  Encephalopathy has improved.  Speech therapy was consulted.  Plan is for a modified barium swallow today.  Depending on how he does he may be able to remove the NG tube in the next few days.    Elevated D-dimer: Secondary to COVID-19/multiple procedures while in the ICU/accessing hemodialysis access-no significant hypoxemia-therefore doubt PE-lower extremity Dopplers are negative.  Suspect can be monitored without any further investigations at this point.  Will follow D-dimers periodically-however if hypoxia worsens-we will have low threshold to perform a CTA chest.  Has been on prophylactic heparin-but with worsening thrombocytopenia-this has been discontinued.  Patient started on bivalirudin.  History of MRSA bacteremia with epidural abscess requiring C4-T2 laminectomy:  MRI  C-spine completed on 9/21-appears to have residual osteomyelitis of the skull base area-have discussed with neurosurgeon-Dr. Kathyrn Sheriff on 9/21-apart from continuing IV antibiotics as previous-no further surgical intervention required.  Plans were to continue vancomycin/rifampin through October 1-however rifampin discontinued due to significantly elevated LFTs.  From documentation by critical care MD-and ID pharmacist (who discussed with infectious disease)-recommendations are to start suppressive doxycycline therapy on 10/1.  Patient will need follow-up with infectious disease clinic post discharge.  Thrombocytopenia I suspect this is multifactorial in etiology from acute illness/sepsis/thrombocytopenia-potentially some contribution from underlying liver cirrhosis-however significant drop from what he first presented with-hence HIT is in the differential Subcutaneous heparin was discontinued.  Is pending.  Patient started on bivalirudin in the interim.  However patient has very poor venous access.  It has been very difficult to get  PTT levels.  We may run into an issue of not being able to appropriately monitor this anticoagulant so may have to discontinue considering risks and benefits.   Normocytic Anemia Secondary to ESRD. Worsened by acute illness-s/p 1 unit of PRBC on 9/29.  Hemoglobin noted to be lower today compared to yesterday.  No overt bleeding.  Continue to monitor.  Shock liver Due to hypotension/sepsis-improving-trend LFTs intermittently.  No further recommendations from GI.  History of HCV/liver cirrhosis HCV antibody positive in the past.  Viral load pending. Liver ultrasound with suggestion of cirrhotic appearance.  Will need outpatient GI follow-up.  DM-2 (A1c 6.5 on 8/11):  Appears to have brittle diabetes resulting in hypoglycemic episodes while in the ICU.  Avoid tight glycemic control.  Monitor CBGs.  Currently on Levemir twice a day along with SSI NovoLog every 4 hours.     ESRD on HD MWF Nephrology following.  Patient dialyzed today.  Metabolic acidosis Likely secondary to incomplete HD treatments-resolved with regular HD sessions.  Hyperkalemia Likely secondary to incomplete dialysis treatments-resolved with hemodialysis  Essential hypertension  Tolerate higher blood pressure in this patient who undergoes dialysis.  Continue to monitor.    Hyperlipidemia Statin on hold-should be able to resume when closer to discharge.  Dementia: Reviewed notes from recent admission-thought to have issues with short-term memory-suspect may have underlying dementia-has history of alcohol/cocaine use.  Deconditioning/debility: Secondary to acute illness on top of chronic debility-Per daughter-has only walked a few steps at SNF.  PT eval completed-SNF on discharge.  GI prophylaxis: PPI  Condition - Stable  Family Communication  :  Daughter -[place]iana Pechacek [phone])  Code Status :  Full Code  Diet :  Diet Order            Diet NPO time specified  Diet effective now                  Disposition Plan  :   Status is: Inpatient  Remains inpatient appropriate because:Inpatient level of care appropriate due to severity of illness  Dispo: The patient is from: SNF              Anticipated d/c is to: Home              Anticipated d/c date is: > 3 days              Patient currently is not medically stable to d/c.   Barriers to discharge: Severe encephalopathy/dysphagia requiring NG tube feedings  Antimicorbials  :    Anti-infectives (From admission, onward)   Start     Dose/Rate Route Frequency Ordered Stop   03/04/20 1000  doxycycline (VIBRA-TABS) tablet 100 mg        100 mg Oral Every 12 hours 03/01/20 0944     02/27/20 1500  ceFAZolin (ANCEF) IVPB 1 g/50 mL premix        1 g 100 mL/hr over 30 Minutes Intravenous Every 24 hours 02/27/20 1433 03/02/20 1445   02/25/20 1000  piperacillin-tazobactam (ZOSYN) IVPB 2.25 g  Status:  Discontinued         2.25 g 100 mL/hr over 30 Minutes Intravenous Every 8 hours 02/25/20 0905 02/27/20 1433   02/25/20 0830  piperacillin-tazo (ZOSYN) NICU IV syringe 225 mg/mL  Status:  Discontinued        100 mg/kg of piperacillin  84.3 kg 84.4 mL/hr over 30 Minutes Intravenous Every 8 hours 02/25/20 0825 02/25/20 0905   02/25/20 0981  vancomycin (VANCOCIN) 1-5 GM/200ML-% IVPB       Note to Pharmacy: Cherylann Banas   : cabinet override      02/25/20 0814 02/25/20 1158   02/23/20 0642  vancomycin (VANCOCIN) 1-5 GM/200ML-% IVPB       Note to Pharmacy: [place], [place] : cabinet override      02/23/20 0642 02/23/20 1859   02/21/20 1200  vancomycin (VANCOCIN) IVPB 1000 mg/200 mL premix        1,000 mg 200 mL/hr over 60 Minutes Intravenous Every M-W-F (Hemodialysis) 02/20/20 1959 03/03/20 1130   02/21/20 1000  remdesivir 100 mg in sodium chloride 0.9 % 100 mL IVPB  Status:  Discontinued       "Followed by" Linked Group Details   100 mg 200 mL/hr over 30 Minutes Intravenous Daily 02/20/20 1632 02/23/20 0633   02/20/20 2000  rifampin (RIFADIN) capsule 300 mg  Status:  Discontinued        300 mg Oral Every 12 hours 02/20/20 1907 02/23/20 0633   02/20/20 1730  remdesivir 200 mg in sodium chloride 0.9% 250 mL IVPB       "Followed by" Linked Group Details   200 mg 580 mL/hr over 30 Minutes Intravenous Once 02/20/20 1632 02/20/20 1913      Inpatient Medications  Scheduled Meds:  calcitRIOL  1.5 mcg Per Tube Q M,W,F-1800   chlorhexidine  15 mL Mouth Rinse BID   Chlorhexidine Gluconate Cloth  6 each Topical Q0600   Chlorhexidine Gluconate Cloth  6 each Topical Q0600   Chlorhexidine Gluconate Cloth  6 each Topical Q0600   darbepoetin (ARANESP) injection - DIALYSIS  150 mcg Intravenous Q Wed-HD   [START ON 03/04/2020] doxycycline  100 mg Oral Q12H   feeding supplement (PROSource TF)  90 mL Per Tube BID   insulin aspart  0-9 Units Subcutaneous Q4H   insulin aspart  4 Units Subcutaneous Q4H   insulin  detemir  5 Units Subcutaneous BID   mouth rinse  15 mL Mouth Rinse q12n4p   multivitamin  1 tablet Per Tube QHS   pantoprazole sodium  40 mg Per Tube Daily   QUEtiapine  12.5 mg Oral QHS   sodium chloride flush  3 mL Intravenous Q12H   Zinc Oxide   Topical TID   zinc sulfate  220 mg Per Tube Daily   Continuous Infusions:  sodium chloride     sodium chloride     sodium chloride Stopped (02/21/20 2227)   sodium chloride     bivalirudin (ANGIOMAX) infusion 0.5 mg/mL (Non-ACS indications) 0.05 mg/kg/hr (03/03/20 0456)   feeding supplement (VITAL 1.5 CAL) 40 mL/hr at 03/03/20 0530   PRN Meds:.sodium chloride, sodium chloride, sodium chloride, sodium chloride, alteplase, fentaNYL (SUBLIMAZE) injection, haloperidol lactate, lidocaine (PF), lidocaine-prilocaine, lip balm, metoprolol tartrate, ondansetron **OR** ondansetron (ZOFRAN) IV, pentafluoroprop-tetrafluoroeth, sodium chloride flush    Bonnielee Haff M.D on 03/03/2020 at 2:21 PM  To page go to [email or web address] - use universal password  [facility] -  Office  [phone]    Objective:   Vitals:   03/03/20 1120 03/03/20 1130 03/03/20 1200 03/03/20 1207  BP: (!) 170/73 (!) 171/69  (!) 158/69  Pulse: 97 94  97  Resp:  18  20  Temp:  98.5 F (36.9 C)  98.3 F (36.8 C)  TempSrc:  Axillary  Axillary  SpO2:  98% 100% 98%  Weight:      Height:        Wt Readings from Last 3 Encounters:  03/03/20 88 kg  01/28/20 86.2 kg  01/24/20 89.4 kg     Intake/Output Summary (Last 24 hours) at 03/03/2020 1421 Last data filed at 03/03/2020 1120 Gross per 24 hour  Intake 120.55 ml  Output 1000 ml  Net -879.45 ml     Physical Exam  General appearance: Awake alert.  In no distress.  Slow to respond Resp: Clear to auscultation bilaterally.  Normal effort Cardio: S1-S2 is normal regular.  No S3-S4.  No rubs murmurs or bruit GI: Abdomen is soft.  Nontender nondistended.  Bowel sounds are present normal.  No masses  organomegaly Extremities: No edema.  Able to move all his extremities Neurologic: No obvious focal deficits.  Knew he was at [facility].  Knew the year was 2021.    Data Review:    CBC Recent Labs  Lab 02/28/20 0426 02/29/20 0053 02/29/20 0147 02/29/20 0147 03/01/20 0257 03/01/20 0742 03/01/20 1750 03/02/20 1202 03/03/20 0929  WBC 11.2*  --  12.5*  --   --  8.4  --  8.9 8.8  HGB 7.6*   < > 7.6*   < > 6.8* 6.8* 9.1* 8.2* 7.6*  HCT 23.4*   < > 23.9*   < > 20.0* 21.2* 29.3* 26.2* 24.2*  PLT 108*  --  107*  --   --  74*  --  62* 62*  MCV 78.5*  --  81.0  --   --  85.1  --  86.8 86.1  MCH 25.5*  --  25.8*  --   --  27.3  --  27.2 27.0  MCHC 32.5  --  31.8  --   --  32.1  --  31.3 31.4  RDW 20.1*  --  21.7*  --   --  23.3*  --  23.3* 22.8*   < > = values in this interval not displayed.    Chemistries  Recent Labs  Lab 02/25/20 1718 02/26/20 0411 02/26/20 0540 02/27/20 0447 02/28/20 0426 02/28/20 5102 02/29/20 0053 02/29/20 0147 02/29/20 1830 03/01/20 0257 03/01/20 0742 03/02/20 1202 03/03/20 0929  NA  --  131*   < >   < >  --  134*   < > 136  --  142 139 139 136  K  --  3.9   < >   < >  --  3.6   < > 3.4*  --  4.3 4.2 4.2 4.2  CL  --  94*   < >   < >  --  97*  --  98  --   --  106 103 103  CO2  --  23   < >   < >  --  19*  --  21*  --   --  24 26 23   GLUCOSE  --  320*   < >   < >  --  175*  --  354*  --   --  234* 224* 184*  BUN  --  49*   < >   < >  --  90*  --  53*  --   --  64* 45* 57*  CREATININE  --  5.84*   < >   < >  --  8.83*  --  5.94*  --   --  7.70* 6.04* 7.54*  CALCIUM  --  7.3*   < >   < >  --  7.6*  --  7.7*  --   --  7.6* 7.9* 7.9*  MG 2.7*  --   --   --   --   --   --  2.2 2.4  --  2.4  --   --   AST  --  697*  --   --  176*  --   --   --   --   --  48* 35 28  ALT  --  625*  --   --  251*  --   --   --   --   --  52* 20 11  ALKPHOS  --  134*  --   --  104  --   --   --   --   --  127* 108 92  BILITOT  --  1.3*  --   --  1.4*  --   --   --   --   --   0.9 0.8 1.1   < > = values in this interval not displayed.    Coagulation profile Recent Labs  Lab 02/26/20 0411  INR 1.7*    Recent Labs    03/03/20 0929  DDIMER 6.16*    Micro Results Recent Results (from the past 240 hour(s))  Culture, respiratory (non-expectorated)     Status: None   Collection Time: 02/25/20  9:31 AM   Specimen: Tracheal Aspirate; Respiratory  Result Value Ref Range Status   Specimen Description TRACHEAL ASPIRATE  Final   Special Requests NONE  Final   Gram Stain   Final    MODERATE WBC PRESENT,BOTH PMN AND MONONUCLEAR ABUNDANT GRAM POSITIVE COCCI ABUNDANT GRAM NEGATIVE RODS FEW GRAM POSITIVE RODS Performed at [facility], [place] [address], [place], [place] [address]    Culture   Final    ABUNDANT ESCHERICHIA COLI MODERATE STAPHYLOCOCCUS AUREUS    Report Status 03/01/2020 FINAL  Final   Organism ID, Bacteria ESCHERICHIA COLI  Final   Organism ID, Bacteria STAPHYLOCOCCUS AUREUS  Final      Susceptibility   Escherichia coli - MIC*    AMPICILLIN >=32 RESISTANT Resistant     CEFAZOLIN <=4 SENSITIVE Sensitive     CEFEPIME <=0.12 SENSITIVE Sensitive     CEFTAZIDIME <=1 SENSITIVE Sensitive     CEFTRIAXONE <=0.25 SENSITIVE Sensitive     CIPROFLOXACIN <=0.25 SENSITIVE Sensitive     GENTAMICIN <=1 SENSITIVE Sensitive     IMIPENEM <=0.25 SENSITIVE Sensitive     TRIMETH/SULFA <=20 SENSITIVE Sensitive     AMPICILLIN/SULBACTAM >=32 RESISTANT Resistant     PIP/TAZO <=4 SENSITIVE Sensitive     * ABUNDANT ESCHERICHIA COLI   Staphylococcus aureus - MIC*    CIPROFLOXACIN <=0.5 SENSITIVE Sensitive     ERYTHROMYCIN >=8 RESISTANT Resistant     GENTAMICIN <=0.5 SENSITIVE Sensitive     OXACILLIN <=0.25 SENSITIVE Sensitive     TETRACYCLINE <=1 SENSITIVE Sensitive     VANCOMYCIN <=0.5 SENSITIVE Sensitive     TRIMETH/SULFA <=10 SENSITIVE Sensitive     CLINDAMYCIN RESISTANT Resistant     RIFAMPIN >=32 RESISTANT Resistant     Inducible Clindamycin  POSITIVE Resistant     * MODERATE STAPHYLOCOCCUS AUREUS    Radiology Reports CT Head Wo Contrast  Result Date: 02/20/2020 CLINICAL DATA:  Delirium.  COVID-19 positive.  Recent neck surgery. EXAM: CT HEAD WITHOUT CONTRAST TECHNIQUE: Contiguous axial images were obtained from the base of the skull through the vertex without intravenous contrast. COMPARISON:  January 28, 2020 FINDINGS: Brain: No subdural, epidural, or subarachnoid hemorrhage.  A lacunar infarct in the right thalamus is stable and nonacute. Moderate white matter changes again identified. No acute cortical ischemia or acute infarct noted. No mass effect or midline shift. Ventricles and sulci are unchanged. Cerebellum, brainstem, and basal cisterns are normal. Vascular: Calcified atherosclerosis in the intracranial carotids. Skull: Normal. Negative for fracture or focal lesion. Sinuses/Orbits: Mild scattered mucosal thickening in the ethmoid air cells and left maxillary sinus. No air-fluid levels. Mastoid air cells and middle ears are well aerated. Other: None. IMPRESSION: 1. No acute intracranial abnormalities. Chronic white matter changes. Chronic right thalamic lacunar infarct. 2. Mild sinus disease as above. Electronically Signed   By: Dorise Bullion III M.D   On: 02/20/2020 14:49   CT Cervical Spine Wo Contrast  Result Date: 02/20/2020 CLINICAL DATA:  COVID positive, recent neck surgery EXAM: CT CERVICAL SPINE WITHOUT CONTRAST TECHNIQUE: Multidetector CT imaging of the cervical spine was performed without intravenous contrast. Multiplanar CT image reconstructions were also generated. COMPARISON:  CT cervical spine, 01/28/2020 FINDINGS: Alignment: Normal. Skull base and vertebrae: No acute fracture. No primary bone lesion. Erosive change of the dens and adjacent transverse process of C1 are not significantly changed compared to prior examination. Soft tissues and spinal canal: Assessment of the soft tissues is somewhat limited by metallic  streak artifact and lack of intravenous contrast; within this limitation, no unexpected fluid collection overlying the laminectomy site. Prevertebral soft tissue edema appears reduced compared to prior examination. No visible canal hematoma. Disc levels: Status post posterior laminectomy and fusion of C4 through T1, with moderate disc space height loss and osteophytosis of C6-C7 and C7-T1. Upper chest: Negative. Other: None. IMPRESSION: 1. Redemonstrated postoperative findings status post posterior laminectomy and fusion of C4 through T1, with moderate disc space height loss and osteophytosis of C6-C7 and C7-T1. 2. Prevertebral soft tissue edema appears reduced compared to prior examination. 3. Postoperative fluid collection overlying laminectomy site does not appear significantly changed compared to prior examination. 4. Assessment of the soft tissues and epidural space is generally limited on noncontrast CT and additionally limited by metallic streak artifact; contrast enhanced MRI is the test of choice for evaluation of edema, fluid collections, the epidural space, and discitis osteomyelitis if clinically suspected in the postoperative setting. Electronically Signed   By: Eddie Candle M.D.   On: 02/20/2020 14:53   MR CERVICAL SPINE WO CONTRAST  Result Date: 02/22/2020 CLINICAL DATA:  69 year old male with history of dorsal cervical epidural abscess, status post operative drainage and decompression last month on 01/06/2020. Follow-up MRI 01/29/2020 with new left occipital condyle, anterior C1 and odontoid marrow edema and erosion compatible with skull base osteomyelitis. End stage renal disease on dialysis. Confusion. Positive COVID-19. EXAM: MRI CERVICAL SPINE WITHOUT CONTRAST TECHNIQUE: Multiplanar, multisequence MR imaging of the cervical spine was performed. No intravenous contrast was administered. COMPARISON:  Recent CT head and cervical spine 02/20/2020. Postoperative MRI 01/29/2020. Preoperative MRI  FINDINGS: The examination had to be discontinued just prior to completion due to confusion, agitation. Only axial GRE imaging was not obtained. Alignment: Straightening and mild reversal of cervical lordosis remains improved since 01/04/2020. Vertebrae: Abnormal marrow edema and/or T2 and STIR hyperintense soft tissue surrounding the odontoid (series 3, image 7) which has been partially eroded along with the adjacent clivus and anterior C1 ring as seen on the recent CT. Mild marrow edema in both occipital condyles, greater on the left. Faint marrow edema also in the C2-C3 facets, mostly on the right (series 3, image 2). Overall  the appearance has not progressed since 01/22/2020. Sequelae of posterior decompression and fusion from C3 through T1. Mild hardware susceptibility artifact. No other cervical or upper thoracic marrow edema or acute osseous abnormality. Cord: Suboptimal cord detail due to motion and hardware susceptibility. No definite cervical or upper thoracic spinal cord signal abnormality. Normalized epidural space since August. Posterior Fossa, vertebral arteries, paraspinal tissues: Major vascular flow voids are preserved in the neck. Postoperative changes to the posterior neck soft tissues with no adverse features. Negative visible lung apices. Cervicomedullary junction and visible posterior fossa are stable and within normal limits. Disc levels: C2-C3: Improved thecal sac patency since 01/04/2020. Foraminal endplate spurring and moderate bilateral facet degeneration. Mild residual spinal stenosis. Mild bilateral C3 foraminal stenosis. C3-C4: Chronic central disc protrusion (series 2, image 7) with mild facet and ligament flavum hypertrophy. Improved thecal sac patency since 01/04/2020 but residual mild spinal stenosis and mild cord mass effect. Moderate bilateral C4 foraminal stenosis. C4-C5 through T1-T2: Posterior decompression with no significant spinal stenosis despite residual disc and endplate  degeneration at some levels. There is mild to moderate bilateral C8 foraminal stenosis related to disc, endplate and residual facet hypertrophy at C7-T1. IMPRESSION: 1. 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 Possible involvement of the right C2-C3 facet joint also. Associated erosion of bone since 01/28/2020 as demonstrated by CT two days ago. But on MRI no other significant progression is identified since August; no new levels of involvement. 2. Resolved posterior epidural abscess following surgery last month. Satisfactorily decompressed levels C4-C5 through T1-T2. 3. Improved thecal sac patency but mild residual degenerative spinal stenosis at both C2-C3 and C3-C4. No definite spinal cord signal abnormality. Electronically Signed   By: Genevie Ann M.D.   On: 02/22/2020 11:48   DG CHEST PORT 1 VIEW  Result Date: 03/01/2020 CLINICAL DATA:  Respiratory distress.  COVID. EXAM: PORTABLE CHEST 1 VIEW COMPARISON:  02/24/2020 FINDINGS: The endotracheal tube is been removed in the enteric tube converted for a feeding tube. Enteric tube tip is off the field of view but below the left hemidiaphragm. Right central venous catheter is unchanged in position. No pneumothorax. Cardiac enlargement. Bilateral basilar infiltrates suggesting multifocal pneumonia. IMPRESSION: Bilateral basilar infiltrates suggesting multifocal pneumonia. Electronically Signed   By: Lucienne Capers M.D.   On: 03/01/2020 05:02   DG CHEST PORT 1 VIEW  Result Date: 02/24/2020 CLINICAL DATA:  COVID-19 positive 02/20/2020, respiratory failure, intubated EXAM: PORTABLE CHEST 1 VIEW COMPARISON:  02/23/2020 FINDINGS: Single frontal view of the chest demonstrates endotracheal tube well above carina. Enteric catheter passes below diaphragm tip excluded by collimation. Bilateral internal jugular catheters are unchanged. Cardiac silhouette is stable. Persistent patchy consolidation at the lung bases, unchanged. No effusion or pneumothorax.  No acute bony abnormalities. IMPRESSION: 1. Stable support devices. 2. Persistent bibasilar patchy consolidation which could reflect atelectasis or pneumonia. Electronically Signed   By: Randa Ngo M.D.   On: 02/24/2020 18:07   DG CHEST PORT 1 VIEW  Result Date: 02/23/2020 CLINICAL DATA:  Encounter for central line placement EXAM: PORTABLE CHEST 1 VIEW COMPARISON:  02/21/2020 FINDINGS: Endotracheal tube approximately 2 cm above the carina. NG tube enters the stomach with the tip not visualized. Right jugular central venous catheter tip in the right atrium unchanged. New left jugular catheter with the tip in the proximal SVC. No pneumothorax. Mild right lower lobe atelectasis. Negative for edema or effusion IMPRESSION: Left jugular central venous catheter tip in the SVC. No pneumothorax. Mild right lower  lobe atelectasis. Endotracheal tube 2 cm above the carina. Electronically Signed   By: Franchot Gallo M.D.   On: 02/23/2020 15:14   DG Chest Port 1 View  Result Date: 02/20/2020 CLINICAL DATA:  COVID EXAM: PORTABLE CHEST 1 VIEW COMPARISON:  01/12/2020 FINDINGS: Interval extubation. Interval placement of large-bore right neck vascular catheter, tip position near the superior cavoatrial junction. The heart size and mediastinal contours are within normal limits. Both lungs are clear. The visualized skeletal structures are unremarkable. IMPRESSION: 1. Interval placement of large-bore right neck vascular catheter, tip position near the superior cavoatrial junction. 2. No acute abnormality of the lungs in AP portable projection. Electronically Signed   By: Eddie Candle M.D.   On: 02/20/2020 13:43   DG Chest Port 1V same Day  Result Date: 03/02/2020 CLINICAL DATA:  Shortness of breath.  Altered mental status. EXAM: PORTABLE CHEST 1 VIEW COMPARISON:  Radiograph yesterday. FINDINGS: Enteric tube remains in place with tip below the diaphragm. Right internal jugular dialysis catheter unchanged. Improving  cardiomegaly with stable mediastinal contours. Persistent but improving patchy bibasilar opacities. No pneumothorax. No large pleural effusion. Surgical hardware in the lower cervical spine is partially included. IMPRESSION: 1. Persistent but improving patchy bibasilar opacities, atelectasis versus pneumonia. 2. Improving cardiomegaly. Electronically Signed   By: Keith Rake M.D.   On: 03/02/2020 15:17   DG Chest Port 1V same Day  Result Date: 02/21/2020 CLINICAL DATA:  Shortness of breath, COVID EXAM: PORTABLE CHEST 1 VIEW COMPARISON:  02/20/2020 FINDINGS: Right dialysis catheter remains in place, unchanged. Heart is normal size. No confluent airspace opacities or effusions. No acute bony abnormality. IMPRESSION: No acute cardiopulmonary disease. Electronically Signed   By: Rolm Baptise M.D.   On: 02/21/2020 13:03   VAS [place] LOWER EXTREMITY VENOUS (DVT)  Result Date: 02/23/2020  Lower Venous DVTStudy Indications: Elevated D dimer. Other Indications: + Covid. Risk Factors: None identified. Performing Technologist: Griffin Basil RCT RDMS  Examination Guidelines: A complete evaluation includes B-mode imaging, spectral Doppler, color Doppler, and power Doppler as needed of all accessible portions of each vessel. Bilateral testing is considered an integral part of a complete examination. Limited examinations for reoccurring indications may be performed as noted. The reflux portion of the exam is performed with the patient in reverse Trendelenburg.  +---------+---------------+---------+-----------+----------+--------------+  RIGHT     Compressibility Phasicity Spontaneity Properties Thrombus Aging  +---------+---------------+---------+-----------+----------+--------------+  CFV       Full            Yes       Yes                                    +---------+---------------+---------+-----------+----------+--------------+  SFJ       Full                                                              +---------+---------------+---------+-----------+----------+--------------+  FV Prox   Full                                                             +---------+---------------+---------+-----------+----------+--------------+  FV Mid    Full                                                             +---------+---------------+---------+-----------+----------+--------------+  FV Distal Full                                                             +---------+---------------+---------+-----------+----------+--------------+  PFV       Full                                                             +---------+---------------+---------+-----------+----------+--------------+  POP       Full            Yes       Yes                                    +---------+---------------+---------+-----------+----------+--------------+  PTV       Full                                                             +---------+---------------+---------+-----------+----------+--------------+  PERO      Full                                                             +---------+---------------+---------+-----------+----------+--------------+   +---------+---------------+---------+-----------+----------+--------------+  LEFT      Compressibility Phasicity Spontaneity Properties Thrombus Aging  +---------+---------------+---------+-----------+----------+--------------+  CFV       Full            Yes       Yes                                    +---------+---------------+---------+-----------+----------+--------------+  SFJ       Full                                                             +---------+---------------+---------+-----------+----------+--------------+  FV Prox   Full                                                             +---------+---------------+---------+-----------+----------+--------------+  FV Mid    Full                                                              +---------+---------------+---------+-----------+----------+--------------+  FV Distal Full                                                             +---------+---------------+---------+-----------+----------+--------------+  PFV       Full                                                             +---------+---------------+---------+-----------+----------+--------------+  POP       Full            Yes       Yes                                    +---------+---------------+---------+-----------+----------+--------------+  PTV       Full                                                             +---------+---------------+---------+-----------+----------+--------------+  PERO      Full                                                             +---------+---------------+---------+-----------+----------+--------------+     Summary: RIGHT: - There is no evidence of deep vein thrombosis in the lower extremity.  - No cystic structure found in the popliteal fossa.  LEFT: - There is no evidence of deep vein thrombosis in the lower extremity.  - No cystic structure found in the popliteal fossa.  *See table(s) above for measurements and observations. Electronically signed by Harold Barban MD on 02/23/2020 at 9:31:43 PM.    Final    US Abdomen Limited RUQ  Result Date: 02/23/2020 CLINICAL DATA:  Transaminitis EXAM: ULTRASOUND ABDOMEN LIMITED RIGHT UPPER QUADRANT COMPARISON:  None. FINDINGS: Gallbladder: The gallbladder was not visualized and may be surgically absent Common bile duct: Diameter: 3 mm Liver: The liver parenchyma is coarsened and heterogeneous. The liver surface appears somewhat nodular there is no discrete hepatic mass. There is suggestion of bidirectional flow within the portal vein Other: The right kidney appears atrophic and echogenic. There are multiple right-sided renal cysts measuring up to approximately 3.6 cm. IMPRESSION: 1. Gallbladder not visualized. 2. Coarsened and heterogeneous appearance  of the liver parenchyma with findings suspicious for underlying cirrhosis. 3. Borderline bidirectional  flow within the main portal vein is suggestive of portal hypertension. 4. Echogenic and atrophic appearing right kidney consistent with the patient's history of renal failure. Electronically Signed   By: Constance Holster M.D.   On: 02/23/2020 19:21

## 2020-03-03 NOTE — Procedures (Signed)
Seen and examined through window on dialysis.  Blood pressure 162/73 and HR 92.  Tolerating goal.  Spoke with HD RN.  Note that his left AVF was accessed for today's treatment and has been working well thus far per his Therapist, sports.   Claudia Desanctis, MD 03/03/2020 8:58 AM

## 2020-03-03 NOTE — Progress Notes (Signed)
Pt ate 50% of dinner - No issues with oxygen saturation during feeding. TF held for and hour will report to Night RN to re-start in 1 hour.

## 2020-03-04 DIAGNOSIS — D638 Anemia in other chronic diseases classified elsewhere: Secondary | ICD-10-CM | POA: Diagnosis not present

## 2020-03-04 DIAGNOSIS — G9341 Metabolic encephalopathy: Secondary | ICD-10-CM | POA: Diagnosis not present

## 2020-03-04 DIAGNOSIS — D696 Thrombocytopenia, unspecified: Secondary | ICD-10-CM | POA: Diagnosis not present

## 2020-03-04 LAB — GLUCOSE, CAPILLARY
Glucose-Capillary: 70 mg/dL (ref 70–99)
Glucose-Capillary: 98 mg/dL (ref 70–99)
Glucose-Capillary: 143 mg/dL — ABNORMAL HIGH (ref 70–99)
Glucose-Capillary: 108 mg/dL — ABNORMAL HIGH (ref 70–99)

## 2020-03-04 LAB — CBC
HCT: 24.3 % — ABNORMAL LOW (ref 39.0–52.0)
Hemoglobin: 7.6 g/dL — ABNORMAL LOW (ref 13.0–17.0)
MCH: 26.8 pg (ref 26.0–34.0)
MCHC: 31.3 g/dL (ref 30.0–36.0)
MCV: 85.6 fL (ref 80.0–100.0)
Platelets: 90 10*3/uL — ABNORMAL LOW (ref 150–400)
RBC: 2.84 MIL/uL — ABNORMAL LOW (ref 4.22–5.81)
RDW: 22.9 % — ABNORMAL HIGH (ref 11.5–15.5)
WBC: 9.5 10*3/uL (ref 4.0–10.5)
nRBC: 0 % (ref 0.0–0.2)

## 2020-03-04 LAB — HCV RNA QUANT RFLX ULTRA OR GENOTYP
HCV RNA Qnt(log copy/mL): UNDETERMINED log10 IU/mL
HepC Qn: NOT DETECTED IU/mL

## 2020-03-04 LAB — COMPREHENSIVE METABOLIC PANEL
ALT: 8 U/L (ref 0–44)
AST: 32 U/L (ref 15–41)
Albumin: 1.8 g/dL — ABNORMAL LOW (ref 3.5–5.0)
Alkaline Phosphatase: 98 U/L (ref 38–126)
Anion gap: 10 (ref 5–15)
BUN: 38 mg/dL — ABNORMAL HIGH (ref 8–23)
CO2: 25 mmol/L (ref 22–32)
Calcium: 7.9 mg/dL — ABNORMAL LOW (ref 8.9–10.3)
Chloride: 99 mmol/L (ref 98–111)
Creatinine, Ser: 5.89 mg/dL — ABNORMAL HIGH (ref 0.61–1.24)
GFR calc Af Amer: 10 mL/min — ABNORMAL LOW (ref >60)
GFR calc non Af Amer: 9 mL/min — ABNORMAL LOW (ref >60)
Glucose, Bld: 166 mg/dL — ABNORMAL HIGH (ref 70–99)
Potassium: 3.9 mmol/L (ref 3.5–5.1)
Sodium: 134 mmol/L — ABNORMAL LOW (ref 135–145)
Total Bilirubin: 1 mg/dL (ref 0.3–1.2)
Total Protein: 6 g/dL — ABNORMAL LOW (ref 6.5–8.1)

## 2020-03-04 LAB — HEPARIN INDUCED PLATELET AB (HIT ANTIBODY): Heparin Induced Plt Ab: 0.601 OD — ABNORMAL HIGH (ref 0.000–0.400)

## 2020-03-04 LAB — APTT: aPTT: 67 seconds — ABNORMAL HIGH (ref 24–36)

## 2020-03-04 MED ORDER — DOXYCYCLINE HYCLATE 100 MG PO TABS
100.0000 mg | ORAL_TABLET | Freq: Two times a day (BID) | ORAL | Status: DC
  Administered 2020-03-04 – 2020-03-05 (×3): 100 mg
  Filled 2020-03-04 (×5): qty 1

## 2020-03-04 MED ORDER — INSULIN DETEMIR 100 UNIT/ML ~~LOC~~ SOLN
5.0000 [IU] | Freq: Every day | SUBCUTANEOUS | Status: DC
  Administered 2020-03-05 – 2020-03-09 (×4): 5 [IU] via SUBCUTANEOUS
  Filled 2020-03-04 (×5): qty 0.05

## 2020-03-04 MED ORDER — QUETIAPINE FUMARATE 25 MG PO TABS
12.5000 mg | ORAL_TABLET | Freq: Every day | ORAL | Status: DC
  Administered 2020-03-04 – 2020-03-05 (×2): 12.5 mg
  Filled 2020-03-04 (×2): qty 1

## 2020-03-04 NOTE — Progress Notes (Signed)
[place] for bivalirudin Indication: R/o HIT  Allergies  Allergen Reactions  . Heparin     R/o HIT with ab/SRA    Patient Measurements: Height: 6' (182.9 cm) Weight: 89.9 kg (198 lb 3.1 oz) IBW/kg (Calculated) : 77.6  Vital Signs: Temp: 98.1 F (36.7 C) (10/02 1321) Temp Source: Oral (10/02 1321) BP: 152/77 (10/02 1321) Pulse Rate: 83 (10/02 1321)  Labs: Recent Labs    03/02/20 1202 03/02/20 1202 03/03/20 0929 03/03/20 1554 03/04/20 1555  HGB 8.2*   < > 7.6*  --  7.6*  HCT 26.2*  --  24.2*  --  24.3*  PLT 62*  --  62*  --  90*  APTT  --   --  57* 55* 67*  CREATININE 6.04*  --  7.54*  --  5.89*   < > = values in this interval not displayed.    Estimated Creatinine Clearance: 13.2 mL/min (A) (by C-G formula based on SCr of 5.89 mg/dL (H)).   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 His rifampin was dced after significant increase with LFTs. They have trended down to WNL. He was transferred out from the ICU. His platelets have trended down significantly with a calculated 4T score of 5 (intermediate). Pharmacist discussed with Dr Sloan Leiter on 9/30 and decided to DC any heparin products and start him on the HIT protocol with bivalirudin.   CALCULATE SCORE:  4Ts (see the HIT Algorithm) Score  Thrombocytopenia 2  Timing 2  Thrombosis 0  Other causes of thrombocytopenia 1  Total 5   Possible HIT    Order SRA:  Yes  Discontinue heparin / LMWH:  Yes  Initiate alternative anticoagulation:  Yes  Document heparin allergy:  Yes  Pt had refused AM labs including aPTT, MD aware.  Now labs have been drawn and resulted, with therapeutic aPTT on bivalirudin 0.05 mg/kg/hr  Goal of Therapy:  aPTT goal: 50-85 sec Monitor platelets by anticoagulation protocol: Yes   Plan:  Continue bivalirudin gtt at 0.05 mg/kg/hr Daily aPTT, CBC, s/s bleeding  Bertis Ruddy, PharmD Clinical Pharmacist Please check AMION  for all [place] numbers 03/04/2020 5:53 PM

## 2020-03-04 NOTE — Progress Notes (Signed)
[place] for bivalirudin Indication: R/o HIT  Allergies  Allergen Reactions  . Heparin     R/o HIT with ab/SRA    Patient Measurements: Height: 6' (182.9 cm) Weight: 89.9 kg (198 lb 3.1 oz) IBW/kg (Calculated) : 77.6  Vital Signs: Temp: 99 F (37.2 C) (10/02 0505) Temp Source: Axillary (10/02 0505) BP: 147/67 (10/02 0505) Pulse Rate: 82 (10/02 0505)  Labs: Recent Labs    03/01/20 1750 03/01/20 1750 03/02/20 1202 03/03/20 0929 03/03/20 1554  HGB 9.1*   < > 8.2* 7.6*  --   HCT 29.3*  --  26.2* 24.2*  --   PLT  --   --  62* 62*  --   APTT  --   --   --  57* 55*  CREATININE  --   --  6.04* 7.54*  --    < > = values in this interval not displayed.    Estimated Creatinine Clearance: 10.3 mL/min (A) (by C-G formula based on SCr of 7.54 mg/dL (H)).   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 His rifampin was dced after significant increase with LFTs. They have trended down to WNL. He was transferred out from the ICU. His platelets have trended down significantly with a calculated 4T score of 5 (intermediate). Pharmacist discussed with Dr Sloan Leiter on 9/30 and decided to DC any heparin products and start him on the HIT protocol with bivalirudin.   CALCULATE SCORE:  4Ts (see the HIT Algorithm) Score  Thrombocytopenia 2  Timing 2  Thrombosis 0  Other causes of thrombocytopenia 1  Total 5   Possible HIT    Order SRA:  Yes  Discontinue heparin / LMWH:  Yes  Initiate alternative anticoagulation:  Yes  Document heparin allergy:  Yes  aPTT has been therapeutic x2, however, we have been unable to obtain any labs today. Patient has been refusing lab draws and has been a difficult stick. I have made the MD aware of the situation and we will continue the current regimen for now with plans to readdress tomorrow if we are still unable to collect levels today.  No bleeding or line issues reported by nursing.   Goal  of Therapy:  aPTT goal: 50-85 sec Monitor platelets by anticoagulation protocol: Yes   Plan:  Continue bivalirudin gtt at 0.05mg /kg/min F/u aPTT and CBC  Will need to address refusal of lab draws if continues   Claudina Lick, PharmD PGY1 Acute Care Pharmacy Resident 03/04/2020 1:11 PM  Please check [email or web address] for unit-specific pharmacy phone numbers.

## 2020-03-04 NOTE — Progress Notes (Signed)
PROGRESS NOTE                                                                                                                                                                                                             Patient Demographics:    [name], is a 69 y.o. male, DOB - [birth year], GYF:[id number]  Outpatient Primary MD for the patient is [facility], [facility]   Admit date - 02/20/2020   LOS - 36  Chief Complaint  Patient presents with  . Covid Positive  . AMS       Brief Narrative: Patient is a 69 y.o. male with PMHx of ESRD on HD MWF, DM-2, MRSA bacteremia with epidural abscess s/p C4-T2 laminectomy/fusion on 01/06/2020-on IV vancomycin/rifampin through 10/1-resident of SNF-apparently tested positive for COVID-19 on 9/18-subsequently brought to the ED on 9/19 for worsening confusion (more than baseline)-fever-thought to have acute metabolic encephalopathy from COVID-19 infection and admitted to the hospitalist service.  Further hospital course complicated by development of hypotension/severe encephalopathy requiring intubation and ICU transfer.  See below for further details.  COVID-19 vaccinated status: Unvaccinated  Significant Events: 8/3-8/23>> hospitalized for epidural abscess/MRSA bacteremia-underwent laminectomy-briefly intubated due to altered mental status.  Discharged to SNF 9/18>> COVID-19 positive at SNF 9/19>> Admit to [facility] for fever/confusion 9/22>> obtunded/hypotensive after coming back from hemodialysis-emergently intubated/transferred to ICU 9/30>> transferred back to [facility] 9/30>> SQ heparin stopped-pharmacy consulted for HIT protocol.  Significant studies: 8/6>> Echo: EF> 75% 9/19>>Chest x-ray: No acute abnormality in the lung. 9/19>> CT head: No acute intracranial abnormality 9/19>> CT C-spine: Postoperative findings of posterior laminectomy/fusion C4-T1, prevertebral soft tissue edema  appears reduced, postoperative fluid collection overlies laminectomy site does not appear significantly changed compared to prior examination. 9/20>> MRI C-spine: Attempted but due to AMS-not completed 9/21>> MRI C Spine: Osteomyelitis of skull base, anterior C1 and odontoid, C2-C3 facet joint-associated erosion of bone since 8/27-resolved epidural abscess 9/22>> coarsened/heterogeneous appearance of the liver parenchyma-suspicious for cirrhosis, gallbladder not visualized. 9/22>> bilateral lower extremity Doppler: No DVT 9/29>> chest x-ray: Bibasilar infiltrates suggesting multifocal pneumonia  COVID-19 medications: Remdesivir: 9/19>> 9/21  Antibiotics: IV vancomycin plan to continue through October 1 (rifampin stopped 9/21 due to elevated LFTs) Ancef: 9/26>> Zosyn: 9/24>> 9/26  Microbiology data: 9/20 >>blood culture: No growth so far 9/21>> blood culture: No growth so far 9/24>> sputum  culture: E. coli/MSSA  Procedures: ET tube 9/22 > 9/25  Consults: Nephrology PCCM GI  DVT prophylaxis: heparin injection 5,000 Units Start: 02/20/20>> stopped on 9/30 due to worsening thrombocytopenia. SCDs Start: 02/20/20 1628   Subjective:   Patient not very communicative.  Denies any complaints.  States that he did well with the supper last night.  Denies any shortness of breath.  No nausea or vomiting.     Assessment  & Plan :   Acute hypoxic respiratory failure secondary to HCAP (E. coli/MSSA in sputum culture) and COVID-19 pneumonia Initially admitted and thought to have mild disease-started on steroid/Remdesivir-but decompensated on 9/22-requiring intubation and ICU transfer.  Extubated 9/26-culture results as above-on IV Ancef with end date of 10/1.  No longer on Remdesivir-stopped on 9/22 due to significantly elevated LFTs.    Respiratory status remains stable.  Saturating normal on room air.  Septic shock, resolved Required pressors while in the ICU-thought to be most likely  secondary to [place] patient has a history of recent MRSA bacteremia/cervical spine osteomyelitis/abscess-this appears to be quiescent and stable.   Acute metabolic encephalopathy Multifactorial etiology-on initial presentation to the hospital was felt to be due to uremia (due to under dialysis-patient got short outpatient HD numerous times), sepsis physiology from COVID-19-however his mental status markedly deteriorated on 9/22-likely due to worsening sepsis physiology from HCAP.  Post extubation-has had issues with delirium/likely related to intubation/ICU stay.  Required Precedex while in the ICU.  Mentation seems to be stable.  Slow to respond but answers appropriately.  Remains on low-dose Seroquel.  Discussed with his daughter yesterday who mentions that patient tends to become noncompliant and when his mentation improves.  She wonders if he has underlying psychiatric problems.  No known history of same.  May benefit from involving psychiatry to assist the patient.  He apparently has short-term memory issues-there was some thought as to whether patient has underlying dementia-has a history of alcohol/cocaine use in the past.  Dysphagia Currently getting tube feedings through NG tube.  His dysphagia was most likely due to his severe encephalopathy.  His mentation has improved.  Speech therapy was reconsulted.  Cleared for dysphagia diet.  Patient ate 50% of his supper yesterday.  Ate only 10% of his breakfast this morning.  Let us see how he does over the next 24 hours.  If oral intake improves then we can discontinue his NG tube.   Elevated D-dimer Secondary to COVID-19/multiple procedures while in the ICU/accessing hemodialysis access-no significant hypoxemia-therefore doubt PE-lower extremity Dopplers are negative.   Considering that his respiratory status is otherwise stable does not need any further work-up for elevated D-dimer at this time.    History of MRSA bacteremia with epidural  abscess requiring C4-T2 laminectomy:  MRI C-spine completed on 9/21-appears to have residual osteomyelitis of the skull base area-have discussed with neurosurgeon-Dr. Kathyrn Sheriff on 9/21-apart from continuing IV antibiotics as previous-no further surgical intervention required.  Plans were to continue vancomycin/rifampin through October 1-however rifampin discontinued due to significantly elevated LFTs.  Patient now off of vancomycin.  Started on doxycycline from 10/1.  Patient will need follow-up with infectious disease clinic post discharge.  Thrombocytopenia Suspect this is multifactorial in etiology from acute illness/sepsis/thrombocytopenia-potentially some contribution from underlying liver cirrhosis-however significant drop from what he first presented with-hence HIT is in the differential Subcutaneous heparin was discontinued.  HIT panel is pending.  Patient started on bivalirudin in the interim.  However patient has very poor venous access.  It has been  very difficult to get PTT levels.  Plus patient has been declining blood draws as well.  We may run into an issue of not being able to appropriately monitor this anticoagulant so may have to discontinue considering risks and benefits.  Discussed with pharmacy.  Will address this issue tomorrow.  Normocytic Anemia Secondary to ESRD. Worsened by acute illness-s/p 1 unit of PRBC on 9/29.  Hemoglobin noted to be 7.6 yesterday.  Patient declined blood draws today.  No overt bleeding has been noted.  We will try to get blood work tomorrow.    Shock liver Due to hypotension/sepsis-improving-trend LFTs intermittently.  No further recommendations from GI.  History of HCV/liver cirrhosis HCV antibody positive in the past.  Viral load pending. Liver ultrasound with suggestion of cirrhotic appearance.  Will need outpatient GI follow-up.  DM-2 (A1c 6.5 on 8/11):  Appears to have brittle diabetes resulting in hypoglycemic episodes while in the ICU.  Avoid  tight glycemic control.  CBGs noted to be on the lower side this morning.  Will cut back on the dose of Levemir.   ESRD on HD MWF Nephrology following.  Patient being dialyzed as per his usual schedule.  Metabolic acidosis Likely secondary to incomplete HD treatments-resolved with regular HD sessions.  Hyperkalemia Likely secondary to incomplete dialysis treatments-resolved with hemodialysis  Essential hypertension  Tolerate higher blood pressure in this patient who undergoes dialysis.  Continue to monitor.    Hyperlipidemia Statin on hold-should be able to resume when closer to discharge.  Dementia Discussed with patient's daughter.  She is not certain if patient truly has cognitive impairment or dementia.  Deconditioning/debility: Secondary to acute illness on top of chronic debility-Per daughter-has only walked a few steps at SNF.  PT eval completed-SNF on discharge.  GI prophylaxis: PPI  Condition - Stable  Family Communication  :  Daughter Johnross Nabozny [phone])  Code Status :  Full Code  Diet :  Diet Order            DIET DYS 2 Room service appropriate? No; Fluid consistency: Nectar Thick  Diet effective now                  Disposition Plan  :   Status is: Inpatient  Remains inpatient appropriate because:Inpatient level of care appropriate due to severity of illness  Dispo: The patient is from: SNF              Anticipated d/c is to: Home              Anticipated d/c date is: > 3 days              Patient currently is not medically stable to d/c.   Barriers to discharge: Severe encephalopathy/dysphagia requiring NG tube feedings  Antimicorbials  :    Anti-infectives (From admission, onward)   Start     Dose/Rate Route Frequency Ordered Stop   03/04/20 1000  doxycycline (VIBRA-TABS) tablet 100 mg  Status:  Discontinued        100 mg Oral Every 12 hours 03/01/20 0944 03/04/20 0741   03/04/20 1000  doxycycline (VIBRA-TABS) tablet 100 mg        100  mg Per Tube Every 12 hours 03/04/20 0741     02/27/20 1500  ceFAZolin (ANCEF) IVPB 1 g/50 mL premix        1 g 100 mL/hr over 30 Minutes Intravenous Every 24 hours 02/27/20 1433 03/02/20 1445   02/25/20 1000  piperacillin-tazobactam (ZOSYN) IVPB 2.25 g  Status:  Discontinued        2.25 g 100 mL/hr over 30 Minutes Intravenous Every 8 hours 02/25/20 0905 02/27/20 1433   02/25/20 0830  piperacillin-tazo (ZOSYN) NICU IV syringe 225 mg/mL  Status:  Discontinued        100 mg/kg of piperacillin  84.3 kg 84.4 mL/hr over 30 Minutes Intravenous Every 8 hours 02/25/20 0825 02/25/20 0905   02/25/20 0814  vancomycin (VANCOCIN) 1-5 GM/200ML-% IVPB       Note to Pharmacy: Cherylann Banas   : cabinet override      02/25/20 0814 02/25/20 1158   02/23/20 0642  vancomycin (VANCOCIN) 1-5 GM/200ML-% IVPB       Note to Pharmacy: [place], [place] : cabinet override      02/23/20 0642 02/23/20 1859   02/21/20 1200  vancomycin (VANCOCIN) IVPB 1000 mg/200 mL premix        1,000 mg 200 mL/hr over 60 Minutes Intravenous Every M-W-F (Hemodialysis) 02/20/20 1959 03/03/20 1130   02/21/20 1000  remdesivir 100 mg in sodium chloride 0.9 % 100 mL IVPB  Status:  Discontinued       "Followed by" Linked Group Details   100 mg 200 mL/hr over 30 Minutes Intravenous Daily 02/20/20 1632 02/23/20 0633   02/20/20 2000  rifampin (RIFADIN) capsule 300 mg  Status:  Discontinued        300 mg Oral Every 12 hours 02/20/20 1907 02/23/20 0633   02/20/20 1730  remdesivir 200 mg in sodium chloride 0.9% 250 mL IVPB       "Followed by" Linked Group Details   200 mg 580 mL/hr over 30 Minutes Intravenous Once 02/20/20 1632 02/20/20 1913      Inpatient Medications  Scheduled Meds: . calcitRIOL  1.5 mcg Per Tube Q M,W,F-1800  . chlorhexidine  15 mL Mouth Rinse BID  . Chlorhexidine Gluconate Cloth  6 each Topical Q0600  . Chlorhexidine Gluconate Cloth  6 each Topical Q0600  . Chlorhexidine Gluconate Cloth  6 each Topical Q0600  .  darbepoetin (ARANESP) injection - DIALYSIS  150 mcg Intravenous Q Wed-HD  . doxycycline  100 mg Per Tube Q12H  . insulin aspart  0-5 Units Subcutaneous QHS  . insulin aspart  0-9 Units Subcutaneous TID WC  . insulin detemir  5 Units Subcutaneous BID  . mouth rinse  15 mL Mouth Rinse q12n4p  . multivitamin  1 tablet Per Tube QHS  . pantoprazole sodium  40 mg Per Tube Daily  . QUEtiapine  12.5 mg Per Tube QHS  . sodium chloride flush  3 mL Intravenous Q12H  . Zinc Oxide   Topical TID  . zinc sulfate  220 mg Per Tube Daily   Continuous Infusions: . sodium chloride    . sodium chloride    . sodium chloride Stopped (02/21/20 2227)  . sodium chloride    . bivalirudin (ANGIOMAX) infusion 0.5 mg/mL (Non-ACS indications) 0.05 mg/kg/hr (03/03/20 0456)   PRN Meds:.sodium chloride, sodium chloride, sodium chloride, sodium chloride, alteplase, fentaNYL (SUBLIMAZE) injection, haloperidol lactate, lidocaine (PF), lidocaine-prilocaine, lip balm, metoprolol tartrate, ondansetron **OR** ondansetron (ZOFRAN) IV, pentafluoroprop-tetrafluoroeth, Resource ThickenUp Clear, sodium chloride flush    Bonnielee Haff M.D on 03/04/2020 at 1:15 PM  To page go to [email or web address] - use universal password  [facility] -  Office  [phone]    Objective:   Vitals:   03/03/20 1207 03/03/20 1453 03/03/20 2117 03/04/20 0505  BP: (!) 158/69 (!) 143/69 140/64 Marland Kitchen)  147/67  Pulse: 97  85 82  Resp: 20  20 20   Temp: 98.3 F (36.8 C) 98.6 F (37 C) 99.2 F (37.3 C) 99 F (37.2 C)  TempSrc: Axillary Oral Axillary Axillary  SpO2: 98%  100% 99%  Weight:    89.9 kg  Height:        Wt Readings from Last 3 Encounters:  03/04/20 89.9 kg  01/28/20 86.2 kg  01/24/20 89.4 kg     Intake/Output Summary (Last 24 hours) at 03/04/2020 1315 Last data filed at 03/04/2020 0830 Gross per 24 hour  Intake 944 ml  Output 150 ml  Net 794 ml     Physical Exam  General appearance: Awake alert.  In no distress.   Slow to respond Resp: Clear to auscultation bilaterally.  Normal effort Cardio: S1-S2 is normal regular.  No S3-S4.  No rubs murmurs or bruit GI: Abdomen is soft.  Nontender nondistended.  Bowel sounds are present normal.  No masses organomegaly Extremities: No edema.   Neurologic: No obvious focal deficits.     Data Review:    CBC Recent Labs  Lab 02/28/20 0426 02/29/20 0053 02/29/20 0147 02/29/20 0147 03/01/20 0257 03/01/20 0742 03/01/20 1750 03/02/20 1202 03/03/20 0929  WBC 11.2*  --  12.5*  --   --  8.4  --  8.9 8.8  HGB 7.6*   < > 7.6*   < > 6.8* 6.8* 9.1* 8.2* 7.6*  HCT 23.4*   < > 23.9*   < > 20.0* 21.2* 29.3* 26.2* 24.2*  PLT 108*  --  107*  --   --  74*  --  62* 62*  MCV 78.5*  --  81.0  --   --  85.1  --  86.8 86.1  MCH 25.5*  --  25.8*  --   --  27.3  --  27.2 27.0  MCHC 32.5  --  31.8  --   --  32.1  --  31.3 31.4  RDW 20.1*  --  21.7*  --   --  23.3*  --  23.3* 22.8*   < > = values in this interval not displayed.    Chemistries  Recent Labs  Lab 02/27/20 0447 02/28/20 0426 02/28/20 1497 02/29/20 0053 02/29/20 0147 02/29/20 1830 03/01/20 0257 03/01/20 0742 03/02/20 1202 03/03/20 0929  NA   < >  --  134*   < > 136  --  142 139 139 136  K   < >  --  3.6   < > 3.4*  --  4.3 4.2 4.2 4.2  CL   < >  --  97*  --  98  --   --  106 103 103  CO2   < >  --  19*  --  21*  --   --  24 26 23   GLUCOSE   < >  --  175*  --  354*  --   --  234* 224* 184*  BUN   < >  --  90*  --  53*  --   --  64* 45* 57*  CREATININE   < >  --  8.83*  --  5.94*  --   --  7.70* 6.04* 7.54*  CALCIUM   < >  --  7.6*  --  7.7*  --   --  7.6* 7.9* 7.9*  MG  --   --   --   --  2.2 2.4  --  2.4  --   --   AST  --  176*  --   --   --   --   --  48* 35 28  ALT  --  251*  --   --   --   --   --  52* 20 11  ALKPHOS  --  104  --   --   --   --   --  127* 108 92  BILITOT  --  1.4*  --   --   --   --   --  0.9 0.8 1.1   < > = values in this interval not displayed.    Coagulation profile No  results for input(s): INR, PROTIME in the last 168 hours.  Recent Labs    03/03/20 0929  DDIMER 6.16*    Micro Results Recent Results (from the past 240 hour(s))  Culture, respiratory (non-expectorated)     Status: None   Collection Time: 02/25/20  9:31 AM   Specimen: Tracheal Aspirate; Respiratory  Result Value Ref Range Status   Specimen Description TRACHEAL ASPIRATE  Final   Special Requests NONE  Final   Gram Stain   Final    MODERATE WBC PRESENT,BOTH PMN AND MONONUCLEAR ABUNDANT GRAM POSITIVE COCCI ABUNDANT GRAM NEGATIVE RODS FEW GRAM POSITIVE RODS Performed at [facility], [place] [address] [place], [place] [address]    Culture   Final    ABUNDANT ESCHERICHIA COLI MODERATE STAPHYLOCOCCUS AUREUS    Report Status 03/01/2020 FINAL  Final   Organism ID, Bacteria ESCHERICHIA COLI  Final   Organism ID, Bacteria STAPHYLOCOCCUS AUREUS  Final      Susceptibility   Escherichia coli - MIC*    AMPICILLIN >=32 RESISTANT Resistant     CEFAZOLIN <=4 SENSITIVE Sensitive     CEFEPIME <=0.12 SENSITIVE Sensitive     CEFTAZIDIME <=1 SENSITIVE Sensitive     CEFTRIAXONE <=0.25 SENSITIVE Sensitive     CIPROFLOXACIN <=0.25 SENSITIVE Sensitive     GENTAMICIN <=1 SENSITIVE Sensitive     IMIPENEM <=0.25 SENSITIVE Sensitive     TRIMETH/SULFA <=20 SENSITIVE Sensitive     AMPICILLIN/SULBACTAM >=32 RESISTANT Resistant     PIP/TAZO <=4 SENSITIVE Sensitive     * ABUNDANT ESCHERICHIA COLI   Staphylococcus aureus - MIC*    CIPROFLOXACIN <=0.5 SENSITIVE Sensitive     ERYTHROMYCIN >=8 RESISTANT Resistant     GENTAMICIN <=0.5 SENSITIVE Sensitive     OXACILLIN <=0.25 SENSITIVE Sensitive     TETRACYCLINE <=1 SENSITIVE Sensitive     VANCOMYCIN <=0.5 SENSITIVE Sensitive     TRIMETH/SULFA <=10 SENSITIVE Sensitive     CLINDAMYCIN RESISTANT Resistant     RIFAMPIN >=32 RESISTANT Resistant     Inducible Clindamycin POSITIVE Resistant     * MODERATE STAPHYLOCOCCUS AUREUS    Radiology Reports CT  Head Wo Contrast  Result Date: 02/20/2020 CLINICAL DATA:  Delirium.  COVID-19 positive.  Recent neck surgery. EXAM: CT HEAD WITHOUT CONTRAST TECHNIQUE: Contiguous axial images were obtained from the base of the skull through the vertex without intravenous contrast. COMPARISON:  January 28, 2020 FINDINGS: Brain: No subdural, epidural, or subarachnoid hemorrhage. A lacunar infarct in the right thalamus is stable and nonacute. Moderate white matter changes again identified. No acute cortical ischemia or acute infarct noted. No mass effect or midline shift. Ventricles and sulci are unchanged. Cerebellum, brainstem, and basal cisterns are normal. Vascular: Calcified atherosclerosis in the intracranial carotids. Skull: Normal. Negative for fracture or focal  lesion. Sinuses/Orbits: Mild scattered mucosal thickening in the ethmoid air cells and left maxillary sinus. No air-fluid levels. Mastoid air cells and middle ears are well aerated. Other: None. IMPRESSION: 1. No acute intracranial abnormalities. Chronic white matter changes. Chronic right thalamic lacunar infarct. 2. Mild sinus disease as above. Electronically Signed   By: Dorise Bullion III M.D   On: 02/20/2020 14:49   CT Cervical Spine Wo Contrast  Result Date: 02/20/2020 CLINICAL DATA:  COVID positive, recent neck surgery EXAM: CT CERVICAL SPINE WITHOUT CONTRAST TECHNIQUE: Multidetector CT imaging of the cervical spine was performed without intravenous contrast. Multiplanar CT image reconstructions were also generated. COMPARISON:  CT cervical spine, 01/28/2020 FINDINGS: Alignment: Normal. Skull base and vertebrae: No acute fracture. No primary bone lesion. Erosive change of the dens and adjacent transverse process of C1 are not significantly changed compared to prior examination. Soft tissues and spinal canal: Assessment of the soft tissues is somewhat limited by metallic streak artifact and lack of intravenous contrast; within this limitation, no unexpected  fluid collection overlying the laminectomy site. Prevertebral soft tissue edema appears reduced compared to prior examination. No visible canal hematoma. Disc levels: Status post posterior laminectomy and fusion of C4 through T1, with moderate disc space height loss and osteophytosis of C6-C7 and C7-T1. Upper chest: Negative. Other: None. IMPRESSION: 1. Redemonstrated postoperative findings status post posterior laminectomy and fusion of C4 through T1, with moderate disc space height loss and osteophytosis of C6-C7 and C7-T1. 2. Prevertebral soft tissue edema appears reduced compared to prior examination. 3. Postoperative fluid collection overlying laminectomy site does not appear significantly changed compared to prior examination. 4. Assessment of the soft tissues and epidural space is generally limited on noncontrast CT and additionally limited by metallic streak artifact; contrast enhanced MRI is the test of choice for evaluation of edema, fluid collections, the epidural space, and discitis osteomyelitis if clinically suspected in the postoperative setting. Electronically Signed   By: Eddie Candle M.D.   On: 02/20/2020 14:53   MR CERVICAL SPINE WO CONTRAST  Result Date: 02/22/2020 CLINICAL DATA:  69 year old male with history of dorsal cervical epidural abscess, status post operative drainage and decompression last month on 01/06/2020. Follow-up MRI 01/29/2020 with new left occipital condyle, anterior C1 and odontoid marrow edema and erosion compatible with skull base osteomyelitis. End stage renal disease on dialysis. Confusion. Positive COVID-19. EXAM: MRI CERVICAL SPINE WITHOUT CONTRAST TECHNIQUE: Multiplanar, multisequence MR imaging of the cervical spine was performed. No intravenous contrast was administered. COMPARISON:  Recent CT head and cervical spine 02/20/2020. Postoperative MRI 01/29/2020. Preoperative MRI FINDINGS: The examination had to be discontinued just prior to completion due to  confusion, agitation. Only axial GRE imaging was not obtained. Alignment: Straightening and mild reversal of cervical lordosis remains improved since 01/04/2020. Vertebrae: Abnormal marrow edema and/or T2 and STIR hyperintense soft tissue surrounding the odontoid (series 3, image 7) which has been partially eroded along with the adjacent clivus and anterior C1 ring as seen on the recent CT. Mild marrow edema in both occipital condyles, greater on the left. Faint marrow edema also in the C2-C3 facets, mostly on the right (series 3, image 2). Overall the appearance has not progressed since 01/22/2020. Sequelae of posterior decompression and fusion from C3 through T1. Mild hardware susceptibility artifact. No other cervical or upper thoracic marrow edema or acute osseous abnormality. Cord: Suboptimal cord detail due to motion and hardware susceptibility. No definite cervical or upper thoracic spinal cord signal abnormality. Normalized epidural space since  August. Posterior Fossa, vertebral arteries, paraspinal tissues: Major vascular flow voids are preserved in the neck. Postoperative changes to the posterior neck soft tissues with no adverse features. Negative visible lung apices. Cervicomedullary junction and visible posterior fossa are stable and within normal limits. Disc levels: C2-C3: Improved thecal sac patency since 01/04/2020. Foraminal endplate spurring and moderate bilateral facet degeneration. Mild residual spinal stenosis. Mild bilateral C3 foraminal stenosis. C3-C4: Chronic central disc protrusion (series 2, image 7) with mild facet and ligament flavum hypertrophy. Improved thecal sac patency since 01/04/2020 but residual mild spinal stenosis and mild cord mass effect. Moderate bilateral C4 foraminal stenosis. C4-C5 through T1-T2: Posterior decompression with no significant spinal stenosis despite residual disc and endplate degeneration at some levels. There is mild to moderate bilateral C8 foraminal  stenosis related to disc, endplate and residual facet hypertrophy at C7-T1. IMPRESSION: 1. 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 Possible involvement of the right C2-C3 facet joint also. Associated erosion of bone since 01/28/2020 as demonstrated by CT two days ago. But on MRI no other significant progression is identified since August; no new levels of involvement. 2. Resolved posterior epidural abscess following surgery last month. Satisfactorily decompressed levels C4-C5 through T1-T2. 3. Improved thecal sac patency but mild residual degenerative spinal stenosis at both C2-C3 and C3-C4. No definite spinal cord signal abnormality. Electronically Signed   By: Genevie Ann M.D.   On: 02/22/2020 11:48   DG CHEST PORT 1 VIEW  Result Date: 03/01/2020 CLINICAL DATA:  Respiratory distress.  COVID. EXAM: PORTABLE CHEST 1 VIEW COMPARISON:  02/24/2020 FINDINGS: The endotracheal tube is been removed in the enteric tube converted for a feeding tube. Enteric tube tip is off the field of view but below the left hemidiaphragm. Right central venous catheter is unchanged in position. No pneumothorax. Cardiac enlargement. Bilateral basilar infiltrates suggesting multifocal pneumonia. IMPRESSION: Bilateral basilar infiltrates suggesting multifocal pneumonia. Electronically Signed   By: Lucienne Capers M.D.   On: 03/01/2020 05:02   DG CHEST PORT 1 VIEW  Result Date: 02/24/2020 CLINICAL DATA:  COVID-19 positive 02/20/2020, respiratory failure, intubated EXAM: PORTABLE CHEST 1 VIEW COMPARISON:  02/23/2020 FINDINGS: Single frontal view of the chest demonstrates endotracheal tube well above carina. Enteric catheter passes below diaphragm tip excluded by collimation. Bilateral internal jugular catheters are unchanged. Cardiac silhouette is stable. Persistent patchy consolidation at the lung bases, unchanged. No effusion or pneumothorax. No acute bony abnormalities. IMPRESSION: 1. Stable support devices. 2.  Persistent bibasilar patchy consolidation which could reflect atelectasis or pneumonia. Electronically Signed   By: Randa Ngo M.D.   On: 02/24/2020 18:07   DG CHEST PORT 1 VIEW  Result Date: 02/23/2020 CLINICAL DATA:  Encounter for central line placement EXAM: PORTABLE CHEST 1 VIEW COMPARISON:  02/21/2020 FINDINGS: Endotracheal tube approximately 2 cm above the carina. NG tube enters the stomach with the tip not visualized. Right jugular central venous catheter tip in the right atrium unchanged. New left jugular catheter with the tip in the proximal SVC. No pneumothorax. Mild right lower lobe atelectasis. Negative for edema or effusion IMPRESSION: Left jugular central venous catheter tip in the SVC. No pneumothorax. Mild right lower lobe atelectasis. Endotracheal tube 2 cm above the carina. Electronically Signed   By: Franchot Gallo M.D.   On: 02/23/2020 15:14   DG Chest Port 1 View  Result Date: 02/20/2020 CLINICAL DATA:  COVID EXAM: PORTABLE CHEST 1 VIEW COMPARISON:  01/12/2020 FINDINGS: Interval extubation. Interval placement of large-bore right neck vascular catheter,  tip position near the superior cavoatrial junction. The heart size and mediastinal contours are within normal limits. Both lungs are clear. The visualized skeletal structures are unremarkable. IMPRESSION: 1. Interval placement of large-bore right neck vascular catheter, tip position near the superior cavoatrial junction. 2. No acute abnormality of the lungs in AP portable projection. Electronically Signed   By: Eddie Candle M.D.   On: 02/20/2020 13:43   DG Chest Port 1V same Day  Result Date: 03/02/2020 CLINICAL DATA:  Shortness of breath.  Altered mental status. EXAM: PORTABLE CHEST 1 VIEW COMPARISON:  Radiograph yesterday. FINDINGS: Enteric tube remains in place with tip below the diaphragm. Right internal jugular dialysis catheter unchanged. Improving cardiomegaly with stable mediastinal contours. Persistent but improving patchy  bibasilar opacities. No pneumothorax. No large pleural effusion. Surgical hardware in the lower cervical spine is partially included. IMPRESSION: 1. Persistent but improving patchy bibasilar opacities, atelectasis versus pneumonia. 2. Improving cardiomegaly. Electronically Signed   By: Keith Rake M.D.   On: 03/02/2020 15:17   DG Chest Port 1V same Day  Result Date: 02/21/2020 CLINICAL DATA:  Shortness of breath, COVID EXAM: PORTABLE CHEST 1 VIEW COMPARISON:  02/20/2020 FINDINGS: Right dialysis catheter remains in place, unchanged. Heart is normal size. No confluent airspace opacities or effusions. No acute bony abnormality. IMPRESSION: No acute cardiopulmonary disease. Electronically Signed   By: Rolm Baptise M.D.   On: 02/21/2020 13:03   DG Swallowing Func-Speech Pathology  Result Date: 03/03/2020 Objective Swallowing Evaluation: Type of Study: MBS-Modified Barium Swallow Study  Patient Details Name: Aydyn Testerman. MRN: [id number] Date of Birth: 07-27-1950 Today's Date: 03/03/2020 Time: SLP Start Time (ACUTE ONLY): 3976 -SLP Stop Time (ACUTE ONLY): 1418 SLP Time Calculation (min) (ACUTE ONLY): 24 min Past Medical History: Past Medical History: Diagnosis Date . Anemia  . Arthritis   left knee . Chronic kidney disease   acute renal failure/injury requiring short-term HD 2013 . Colon cancer ([place])  . Colon polyp   11/2011 - Polyps identified, biopsy - invasive adenocarinoma . DM II (diabetes mellitus, type II), controlled ([place])   type 2 IDDM x 15 years. A1C 1/09 13.7.  Marland Kitchen 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 12/25/2011 . GERD (gastroesophageal reflux disease)  . Heart murmur  . Hx of ileostomy 10/13/2012 . Hyperlipidemia  . Hypertension   for a few years and resolved in 2013/2014. . Sleep apnea   does not wear CPAP now . Wears glasses  Past Surgical History: Past Surgical History: Procedure Laterality Date . APPLICATION OF WOUND VAC  7/34/1937  Procedure: APPLICATION OF WOUND VAC;   Surgeon: Zenovia Jarred, MD;  Location: [place];  Service: General;  Laterality: N/A; . APPLICATION OF WOUND VAC  02/03/4096  Procedure: APPLICATION OF WOUND VAC;  Surgeon: Imogene Burn. [name], MD;  Location: [place];  Service: General;  Laterality: N/A; . AV FISTULA PLACEMENT Left 07/29/2019  Procedure: ARTERIOVENOUS (AV) FISTULA CREATION;  Surgeon: Marty Heck, MD;  Location: [place];  Service: Vascular;  Laterality: Left; . COLONOSCOPY   . COLONOSCOPY  02/06/2012  Procedure: COLONOSCOPY;  Surgeon: Milus Banister, MD;  Location: [place];  Service: Endoscopy;; . COLONOSCOPY WITH PROPOFOL N/A 06/10/2019  Procedure: COLONOSCOPY WITH PROPOFOL;  Surgeon: Milus Banister, MD;  Location: [facility];  Service: Endoscopy;  Laterality: N/A; . COLOSTOMY REVERSAL   . DRESSING CHANGE UNDER ANESTHESIA  02/18/2012  Procedure: DRESSING CHANGE UNDER ANESTHESIA;  Surgeon: Odis Hollingshead, MD;  Location: [place];  Service: General;  Laterality: N/A; . ESOPHAGOGASTRODUODENOSCOPY (EGD) WITH PROPOFOL N/A 06/10/2019  Procedure: ESOPHAGOGASTRODUODENOSCOPY (EGD) WITH PROPOFOL;  Surgeon: Milus Banister, MD;  Location: [facility];  Service: Endoscopy;  Laterality: N/A; . GANGLION CYST EXCISION  20 YRS AGO  RT ARM . ILEOSTOMY  02/16/2012  Procedure: ILEOSTOMY;  Surgeon: Zenovia Jarred, MD;  Location: [place];  Service: General;  Laterality: N/A; . ILEOSTOMY CLOSURE N/A 09/02/2012  Procedure: ILEOSTOMY REVERSAL;  Surgeon: Imogene Burn. [name], MD;  Location: [place];  Service: General;  Laterality: N/A; . ILIOSTOMY   . INCISION AND DRAINAGE OF WOUND  02/23/2012  Procedure: IRRIGATION AND DEBRIDEMENT WOUND;  Surgeon: Joyice Faster. Cornett, MD;  Location: [place];  Service: General;  Laterality: N/A; . IR DIALY SHUNT INTRO [place] W/IMG LEFT Left 01/21/2020 . IR FLUORO GUIDE CV LINE RIGHT  01/21/2020 . IR REMOVAL TUN CV CATH W/O FL  01/07/2020 . IR US GUIDE VASC ACCESS LEFT  01/21/2020 . IR US GUIDE VASC ACCESS RIGHT  01/21/2020 . LAPAROTOMY   02/16/2012  Procedure: EXPLORATORY LAPAROTOMY;  Surgeon: Zenovia Jarred, MD;  Location: [place];  Service: General;  Laterality: N/A;  Exploratory Laparotomy with resection of anastomosis . LAPAROTOMY  02/18/2012  Procedure: EXPLORATORY LAPAROTOMY;  Surgeon: Odis Hollingshead, MD;  Location: [place];  Service: General;  Laterality: N/A;  exploratory laparotomy  change of abdominal vac dressing . LAPAROTOMY  02/20/2012  Procedure: EXPLORATORY LAPAROTOMY;  Surgeon: Odis Hollingshead, MD;  Location: [place];  Service: General;  Laterality: N/A; . LAPAROTOMY  02/23/2012  Procedure: EXPLORATORY LAPAROTOMY;  Surgeon: Joyice Faster. Cornett, MD;  Location: [place];  Service: General;  Laterality: N/A;  Irrigation and Debridement of abdominal wound with wound vac change with partial closure . LAPAROTOMY  02/26/2012  Procedure: EXPLORATORY LAPAROTOMY;  Surgeon: Imogene Burn. [name], MD;  Location: [place];  Service: General;  Laterality: N/A;   . PARTIAL COLECTOMY  02/06/2012 . PARTIAL COLECTOMY  02/06/2012  Procedure: PARTIAL COLECTOMY;  Surgeon: Imogene Burn. [name], MD;  Location: [place];  Service: General;  Laterality: N/A;  right partial colectomy . POSTERIOR CERVICAL FUSION/FORAMINOTOMY N/A 01/06/2020  Procedure: CERVICAL FOUR- THORACIC TWO LAMINECTOMY AND FUSION;  Surgeon: Consuella Lose, MD;  Location: [place];  Service: Neurosurgery;  Laterality: N/A;  CERVICAL FOUR- THORACIC TWO LAMINECTOMY AND FUSION . RESECTION SMALL BOWEL / CLOSURE ILEOSTOMY  402/2014  Dr [name] . VACUUM ASSISTED CLOSURE CHANGE  02/20/2012  Procedure: ABDOMINAL VACUUM ASSISTED CLOSURE CHANGE;  Surgeon: Odis Hollingshead, MD;  Location: [place];  Service: General;; . VACUUM ASSISTED CLOSURE CHANGE  02/23/2012  Procedure: ABDOMINAL VACUUM ASSISTED CLOSURE CHANGE;  Surgeon: Joyice Faster. Cornett, MD;  Location: [place];  Service: General;  Laterality: N/A; HPI: Jayde Apelles Buck Mcaffee. is a 69 y.o. male with medical history significant of GERD, hypertension, hyperlipidemia ESRD on  hemodialysis, type 2 diabetes mellitus, anemia of chronic disease, colon cancer status post resection, epidural abscess status post laminectomy, posterior cervical fusion on 01/06/2020 and MRSA bacteremia and presented emergently for evaluation of confusion and COVID-19 positive. Initial BSE 02/21/20 was limited- needed enouragement, audible swallow, eructation, poor dentition and reduced endurance, Work of breathing was stable. Dys 2/thin recommended. On 9/22 became unresponsive after HD, transferred to ICU and intubated then extubated 9/25. Currently has NGT, encephalopathy improving per MD note and speech to re-eval. CXR Bilateral basilar infiltrates suggesting multifocal pneumonia.  No data recorded Assessment / Plan / Recommendation CHL IP CLINICAL IMPRESSIONS 03/03/2020 Clinical Impression Pt demonstrates mild orpharyngeal  dysphagia mostly related to anatomical differences. 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 In addition, initiation of swallow is intermittently late with boluses reaching the pyriform sinuses for up to 5 seconds prior to swallow. His epiglottis at times does not fully invert adding to inadequate protection. Thin barium was penetrated before and during the swallow remaining superior to the vocal cords. His reflexive throat clear, present most of the time did not clear vestibule and a chin tuck was not effective. Penetration was present once with nectar at end of study. Nectar thick recommended for pt to initiate diet and upgrade to thin from clinical observation with SLP. Dys 2 texture recommended, crush pills, small sips/bites, intermittent throat clear and avoid straws.    SLP Visit Diagnosis Dysphagia, pharyngeal phase (R13.13) Attention and concentration deficit following -- Frontal lobe and executive function deficit following -- Impact on safety and function Moderate aspiration risk    CHL IP TREATMENT RECOMMENDATION 03/03/2020 Treatment Recommendations Therapy as outlined in treatment plan below   Prognosis 03/03/2020 Prognosis for Safe Diet Advancement Good Barriers to Reach Goals -- Barriers/Prognosis Comment -- CHL IP DIET RECOMMENDATION 03/03/2020 SLP Diet Recommendations Dysphagia 2 (Fine chop) solids;Nectar thick liquid Liquid Administration via Cup Medication Administration Crushed with puree Compensations Slow rate;Small sips/bites;Clear throat intermittently Postural Changes Seated upright at 90 degrees   CHL IP OTHER RECOMMENDATIONS 03/03/2020 Recommended Consults -- Oral Care Recommendations Oral care BID Other Recommendations Order thickener from pharmacy   CHL IP FOLLOW UP RECOMMENDATIONS 03/03/2020 Follow up Recommendations (No Data)   CHL IP FREQUENCY AND DURATION 03/03/2020 Speech Therapy Frequency (ACUTE ONLY) min 2x/week Treatment Duration 2 weeks      CHL IP ORAL PHASE 03/03/2020 Oral Phase Impaired Oral - Pudding Teaspoon -- Oral - Pudding Cup -- Oral - Honey Teaspoon -- Oral - Honey Cup -- Oral - Nectar Teaspoon -- Oral - Nectar Cup WFL Oral - Nectar Straw -- Oral - Thin Teaspoon -- Oral - Thin Cup Decreased bolus cohesion Oral - Thin Straw -- Oral - Puree -- Oral - Mech Soft -- Oral - Regular Delayed oral transit Oral - Multi-Consistency -- Oral - Pill -- Oral Phase - Comment --  CHL IP PHARYNGEAL PHASE 03/03/2020 Pharyngeal Phase Impaired Pharyngeal- Pudding Teaspoon -- Pharyngeal -- Pharyngeal- Pudding Cup -- Pharyngeal -- Pharyngeal- Honey Teaspoon -- Pharyngeal -- Pharyngeal- Honey Cup -- Pharyngeal -- Pharyngeal- Nectar Teaspoon -- Pharyngeal -- Pharyngeal- Nectar Cup Delayed swallow initiation-pyriform sinuses;Reduced epiglottic inversion;Reduced airway/laryngeal closure;Penetration/Aspiration during swallow;Pharyngeal residue - valleculae Pharyngeal Material enters airway, remains ABOVE vocal cords and not ejected out Pharyngeal- Nectar Straw -- Pharyngeal -- Pharyngeal-  Thin Teaspoon -- Pharyngeal -- Pharyngeal- Thin Cup Delayed swallow initiation-pyriform sinuses;Pharyngeal residue - valleculae;Pharyngeal residue - pyriform;Penetration/Aspiration before swallow;Reduced airway/laryngeal closure Pharyngeal Material enters airway, remains ABOVE vocal cords and not ejected out Pharyngeal- Thin Straw -- Pharyngeal -- Pharyngeal- Puree -- Pharyngeal -- Pharyngeal- Mechanical Soft -- Pharyngeal -- Pharyngeal- Regular WFL Pharyngeal -- Pharyngeal- Multi-consistency -- Pharyngeal -- Pharyngeal- Pill -- Pharyngeal -- Pharyngeal Comment --  CHL IP CERVICAL ESOPHAGEAL PHASE 03/03/2020 Cervical Esophageal Phase WFL Pudding Teaspoon -- Pudding Cup -- Honey Teaspoon -- Honey Cup -- Nectar Teaspoon -- Nectar Cup -- Nectar Straw -- Thin Teaspoon -- Thin Cup -- Thin Straw -- Puree -- Mechanical Soft -- Regular -- Multi-consistency -- Pill -- Cervical Esophageal Comment -- [place] Siren 03/03/2020, 4:35 PM Orbie Pyo Litaker M.Ed [employer]  Pathologist Pager [phone] Office [phone]              VAS [place] LOWER EXTREMITY VENOUS (DVT)  Result Date: 02/23/2020  Lower Venous DVTStudy Indications: Elevated D dimer. Other Indications: + Covid. Risk Factors: None identified. Performing Technologist: Griffin Basil RCT RDMS  Examination Guidelines: A complete evaluation includes B-mode imaging, spectral Doppler, color Doppler, and power Doppler as needed of all accessible portions of each vessel. Bilateral testing is considered an integral part of a complete examination. Limited examinations for reoccurring indications may be performed as noted. The reflux portion of the exam is performed with the patient in reverse Trendelenburg.  +---------+---------------+---------+-----------+----------+--------------+ RIGHT    CompressibilityPhasicitySpontaneityPropertiesThrombus Aging +---------+---------------+---------+-----------+----------+--------------+ CFV      Full           Yes       Yes                                 +---------+---------------+---------+-----------+----------+--------------+ SFJ      Full                                                        +---------+---------------+---------+-----------+----------+--------------+ FV Prox  Full                                                        +---------+---------------+---------+-----------+----------+--------------+ FV Mid   Full                                                        +---------+---------------+---------+-----------+----------+--------------+ FV DistalFull                                                        +---------+---------------+---------+-----------+----------+--------------+ PFV      Full                                                        +---------+---------------+---------+-----------+----------+--------------+ POP      Full           Yes      Yes                                 +---------+---------------+---------+-----------+----------+--------------+ PTV      Full                                                        +---------+---------------+---------+-----------+----------+--------------+  PERO     Full                                                        +---------+---------------+---------+-----------+----------+--------------+   +---------+---------------+---------+-----------+----------+--------------+ LEFT     CompressibilityPhasicitySpontaneityPropertiesThrombus Aging +---------+---------------+---------+-----------+----------+--------------+ CFV      Full           Yes      Yes                                 +---------+---------------+---------+-----------+----------+--------------+ SFJ      Full                                                        +---------+---------------+---------+-----------+----------+--------------+ FV Prox  Full                                                         +---------+---------------+---------+-----------+----------+--------------+ FV Mid   Full                                                        +---------+---------------+---------+-----------+----------+--------------+ FV DistalFull                                                        +---------+---------------+---------+-----------+----------+--------------+ PFV      Full                                                        +---------+---------------+---------+-----------+----------+--------------+ POP      Full           Yes      Yes                                 +---------+---------------+---------+-----------+----------+--------------+ PTV      Full                                                        +---------+---------------+---------+-----------+----------+--------------+ PERO     Full                                                        +---------+---------------+---------+-----------+----------+--------------+  Summary: RIGHT: - There is no evidence of deep vein thrombosis in the lower extremity.  - No cystic structure found in the popliteal fossa.  LEFT: - There is no evidence of deep vein thrombosis in the lower extremity.  - No cystic structure found in the popliteal fossa.  *See table(s) above for measurements and observations. Electronically signed by Harold Barban MD on 02/23/2020 at 9:31:43 PM.    Final    US Abdomen Limited RUQ  Result Date: 02/23/2020 CLINICAL DATA:  Transaminitis EXAM: ULTRASOUND ABDOMEN LIMITED RIGHT UPPER QUADRANT COMPARISON:  None. FINDINGS: Gallbladder: The gallbladder was not visualized and may be surgically absent Common bile duct: Diameter: 3 mm Liver: The liver parenchyma is coarsened and heterogeneous. The liver surface appears somewhat nodular there is no discrete hepatic mass. There is suggestion of bidirectional flow within the portal vein Other: The right kidney appears atrophic and echogenic. There are  multiple right-sided renal cysts measuring up to approximately 3.6 cm. IMPRESSION: 1. Gallbladder not visualized. 2. Coarsened and heterogeneous appearance of the liver parenchyma with findings suspicious for underlying cirrhosis. 3. Borderline bidirectional flow within the main portal vein is suggestive of portal hypertension. 4. Echogenic and atrophic appearing right kidney consistent with the patient's history of renal failure. Electronically Signed   By: Constance Holster M.D.   On: 02/23/2020 19:21

## 2020-03-04 NOTE — Progress Notes (Signed)
[facility] Progress Note  Subjective:   Last HD on 10/1 with 1 kg UF.  Note that his left AVF was accessed for HD on 10/1 and did ok.  He is a poor historian but states HD went ok.  Per staff he refused his medications this morning - he states he is willing to take  Review of systems:  denies overt shortness of breath, chest pain, nausea or vomiting.  States that breathing is "much better now"   Vitals:   03/03/20 1207 03/03/20 1453 03/03/20 2117 03/04/20 0505  BP: (!) 158/69 (!) 143/69 140/64 (!) 147/67  Pulse: 97  85 82  Resp: 20  20 20   Temp: 98.3 F (36.8 C) 98.6 F (37 C) 99.2 F (37.3 C) 99 F (37.2 C)  TempSrc: Axillary Oral Axillary Axillary  SpO2: 98%  100% 99%  Weight:    89.9 kg  Height:        Exam:  adult male in bed in NAD   NCAT; has nasal tube   Chest clear but reduced anteriorly unlabored at rest  S1S2 no rub   Abd soft nt obese habitus   Ext no LE edema; still in mittens     Neuro awake on arrival; oriented to person ; states is 2016 and location of [place], [place] Psych - no agitation or anxiety on my exam      R IJ TDC and L arm AVF+bruit and thrill      CXR 9/23 - patchy perihilar/ basilar dz, no edema      OP HD: [facility] MWF   4h 57min  87kg  2/2 bath  TDC / L AVF  Hep none  -Mircera 150 mcg IV q 2 weeks (last dose 02/09/2020) -Venofer 100 mg IV X 8 doses (not started yet) -Calcitriol 1.5 mcg PO TIW  Assessment/ Plan: 1. COVID 19-recent diagnosis. Tested positive at [facility] 09/18 and again here.  Per primary team. 2. Acute hypoxic resp failure.  At one point required mechanical ventilation and now extubated; per primary team; optimize volume status with HD 3. AMS - agitation - is better; per primary 4. Recent epidural abscess/MRSA cervical discitis-  S/p decompression surgery 01/06/20. S/p Vancomycin and Rifampin here through 10/01 per charting but rifampin off a bit early due to LFT's per charting. s/p cefazolin. abx per primary team  discretion.  5.  ESRD - MWF HD schedule. Daily labs ordered and not available 6.  HD access - L BC AVF placed Feb 2021. Was used starting in May and had infiltration in Aug prompting [facility] placement to rest the AVF. Using [facility] here and first attempted AVF again on 10/1 - worked well 7.  Hypertension/volume - optimize volume with HD. On 9/28, decreased from 20 mg TID to 10 mg TID and ultimately later to 5 mg TID and now have stopped midodrine.  Per med list reportedly on anti-htn's at home. Gentle UF with HD as tolerated. 8. Anemia  - aranesp increased to 150 mcg for 9/29 onward for now. Hold OP Fe load.  s/p PRBC's 9/29 9.  Metabolic bone disease - off renvela due to hypophosphatemia and improved last check. Anticipate need to resume at a lower dose per trends. on calcitriol 10.  DM-per primary    Recent Labs  Lab 02/29/20 1830 03/01/20 0257 03/02/20 1202 03/03/20 0929  K  --    < > 4.2 4.2  BUN  --    < > 45* 57*  CREATININE  --    < >  6.04* 7.54*  CALCIUM  --    < > 7.9* 7.9*  PHOS 2.0*  --  2.5  --   HGB  --    < > 8.2* 7.6*   < > = values in this interval not displayed.   Inpatient medications: . calcitRIOL  1.5 mcg Per Tube Q M,W,F-1800  . chlorhexidine  15 mL Mouth Rinse BID  . Chlorhexidine Gluconate Cloth  6 each Topical Q0600  . Chlorhexidine Gluconate Cloth  6 each Topical Q0600  . Chlorhexidine Gluconate Cloth  6 each Topical Q0600  . darbepoetin (ARANESP) injection - DIALYSIS  150 mcg Intravenous Q Wed-HD  . doxycycline  100 mg Per Tube Q12H  . insulin aspart  0-5 Units Subcutaneous QHS  . insulin aspart  0-9 Units Subcutaneous TID WC  . insulin detemir  5 Units Subcutaneous BID  . mouth rinse  15 mL Mouth Rinse q12n4p  . multivitamin  1 tablet Per Tube QHS  . pantoprazole sodium  40 mg Per Tube Daily  . QUEtiapine  12.5 mg Per Tube QHS  . sodium chloride flush  3 mL Intravenous Q12H  . Zinc Oxide   Topical TID  . zinc sulfate  220 mg Per Tube Daily   . sodium  chloride    . sodium chloride    . sodium chloride Stopped (02/21/20 2227)  . sodium chloride    . bivalirudin (ANGIOMAX) infusion 0.5 mg/mL (Non-ACS indications) 0.05 mg/kg/hr (03/03/20 0456)   sodium chloride, sodium chloride, sodium chloride, sodium chloride, alteplase, fentaNYL (SUBLIMAZE) injection, haloperidol lactate, lidocaine (PF), lidocaine-prilocaine, lip balm, metoprolol tartrate, ondansetron **OR** ondansetron (ZOFRAN) IV, pentafluoroprop-tetrafluoroeth, Resource ThickenUp Clear, sodium chloride flush   Claudia Desanctis, MD 03/04/2020  11:56 AM

## 2020-03-05 DIAGNOSIS — D638 Anemia in other chronic diseases classified elsewhere: Secondary | ICD-10-CM | POA: Diagnosis not present

## 2020-03-05 DIAGNOSIS — G9341 Metabolic encephalopathy: Secondary | ICD-10-CM | POA: Diagnosis not present

## 2020-03-05 DIAGNOSIS — D696 Thrombocytopenia, unspecified: Secondary | ICD-10-CM | POA: Diagnosis not present

## 2020-03-05 LAB — COMPREHENSIVE METABOLIC PANEL WITH GFR
ALT: 9 U/L (ref 0–44)
AST: 30 U/L (ref 15–41)
Albumin: 2 g/dL — ABNORMAL LOW (ref 3.5–5.0)
Alkaline Phosphatase: 93 U/L (ref 38–126)
Anion gap: 14 (ref 5–15)
BUN: 49 mg/dL — ABNORMAL HIGH (ref 8–23)
CO2: 23 mmol/L (ref 22–32)
Calcium: 8.2 mg/dL — ABNORMAL LOW (ref 8.9–10.3)
Chloride: 101 mmol/L (ref 98–111)
Creatinine, Ser: 6.92 mg/dL — ABNORMAL HIGH (ref 0.61–1.24)
GFR calc Af Amer: 9 mL/min — ABNORMAL LOW
GFR calc non Af Amer: 7 mL/min — ABNORMAL LOW
Glucose, Bld: 134 mg/dL — ABNORMAL HIGH (ref 70–99)
Potassium: 4 mmol/L (ref 3.5–5.1)
Sodium: 138 mmol/L (ref 135–145)
Total Bilirubin: 1.3 mg/dL — ABNORMAL HIGH (ref 0.3–1.2)
Total Protein: 6.1 g/dL — ABNORMAL LOW (ref 6.5–8.1)

## 2020-03-05 LAB — CBC
HCT: 25.7 % — ABNORMAL LOW (ref 39.0–52.0)
Hemoglobin: 8.1 g/dL — ABNORMAL LOW (ref 13.0–17.0)
MCH: 26.8 pg (ref 26.0–34.0)
MCHC: 31.5 g/dL (ref 30.0–36.0)
MCV: 85.1 fL (ref 80.0–100.0)
Platelets: 94 10*3/uL — ABNORMAL LOW (ref 150–400)
RBC: 3.02 MIL/uL — ABNORMAL LOW (ref 4.22–5.81)
RDW: 22.8 % — ABNORMAL HIGH (ref 11.5–15.5)
WBC: 8.3 10*3/uL (ref 4.0–10.5)
nRBC: 0 % (ref 0.0–0.2)

## 2020-03-05 LAB — GLUCOSE, CAPILLARY
Glucose-Capillary: 115 mg/dL — ABNORMAL HIGH (ref 70–99)
Glucose-Capillary: 116 mg/dL — ABNORMAL HIGH (ref 70–99)
Glucose-Capillary: 181 mg/dL — ABNORMAL HIGH (ref 70–99)
Glucose-Capillary: 168 mg/dL — ABNORMAL HIGH (ref 70–99)

## 2020-03-05 LAB — APTT: aPTT: 66 seconds — ABNORMAL HIGH (ref 24–36)

## 2020-03-05 MED ORDER — LOPERAMIDE HCL 2 MG PO CAPS
4.0000 mg | ORAL_CAPSULE | Freq: Once | ORAL | Status: AC
  Administered 2020-03-05: 4 mg via ORAL
  Filled 2020-03-05: qty 2

## 2020-03-05 MED ORDER — CHLORHEXIDINE GLUCONATE CLOTH 2 % EX PADS
6.0000 | MEDICATED_PAD | Freq: Every day | CUTANEOUS | Status: DC
  Administered 2020-03-07 – 2020-03-09 (×2): 6 via TOPICAL

## 2020-03-05 MED ORDER — LOPERAMIDE HCL 2 MG PO CAPS
4.0000 mg | ORAL_CAPSULE | Freq: Three times a day (TID) | ORAL | Status: DC | PRN
  Administered 2020-03-05: 4 mg via ORAL
  Administered 2020-03-11: 2 mg via ORAL
  Administered 2020-03-12: 4 mg via ORAL
  Filled 2020-03-05 (×5): qty 2

## 2020-03-05 NOTE — Progress Notes (Addendum)
[place] for bivalirudin Indication: R/o HIT  Allergies  Allergen Reactions  . Heparin     R/o HIT with ab/SRA    Patient Measurements: Height: 6' (182.9 cm) Weight:  (transferred pt to low bed and forgot to zero bed) IBW/kg (Calculated) : 77.6  Vital Signs: Temp: 98.2 F (36.8 C) (10/03 0420) Temp Source: Axillary (10/03 0420) BP: 162/74 (10/03 0634) Pulse Rate: 88 (10/03 0634)  Labs: Recent Labs    03/02/20 1202 03/02/20 1202 03/03/20 0929 03/03/20 0929 03/03/20 1554 03/04/20 1555 03/05/20 1043  HGB 8.2*   < > 7.6*   < >  --  7.6* 8.1*  HCT 26.2*   < > 24.2*  --   --  24.3* 25.7*  PLT 62*   < > 62*  --   --  90* 94*  APTT  --   --  57*   < > 55* 67* 66*  CREATININE 6.04*  --  7.54*  --   --  5.89*  --    < > = values in this interval not displayed.    Estimated Creatinine Clearance: 13.2 mL/min (A) (by C-G formula based on SCr of 5.89 mg/dL (H)).   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 His rifampin was dced after significant increase with LFTs. They have trended down to WNL. He was transferred out from the ICU. His platelets have trended down significantly with a calculated 4T score of 5 (intermediate). Pharmacist discussed with Dr Sloan Leiter on 9/30 and decided to DC any heparin products and start him on the HIT protocol with bivalirudin.   CALCULATE SCORE:  4Ts (see the HIT Algorithm) Score  Thrombocytopenia 2  Timing 2  Thrombosis 0  Other causes of thrombocytopenia 1  Total 5   Possible HIT    Order SRA:  Yes  Discontinue heparin / LMWH:  Yes  Initiate alternative anticoagulation:  Yes  Document heparin allergy:  Yes  APTT remains therapeutic at 66 on bivalirudin 0.05 mg/kg/hr. HITab slightly above normal at 0.601, SRA still in process. CBC improving slowly with Hgb 8.1, Plt 94. No overt bleeding documented. Will continue current regimen.   Goal of Therapy:  aPTT goal: 50-85  sec Monitor platelets by anticoagulation protocol: Yes   Plan:  Continue bivalirudin gtt at 0.05 mg/kg/hr Daily aPTT, CBC, s/s bleeding F/u SRA results   Claudina Lick, PharmD PGY1 Acute Care Pharmacy Resident 03/05/2020 11:51 AM  Please check [email or web address] for unit-specific pharmacy phone numbers.

## 2020-03-05 NOTE — Progress Notes (Signed)
[facility] Progress Note  Subjective:   Last HD on 10/1 with 1 kg UF.  Note that his left AVF was accessed for HD on 10/1 and this did ok.   He has been more alert recently and has been out of the mitten restraints today.  Feels ok this morning.   Review of systems:  denies shortness of breath, chest pain, nausea or vomiting  Vitals:   03/04/20 1321 03/04/20 2022 03/05/20 0420 03/05/20 0634  BP: (!) 152/77 (!) 158/77 (!) 174/57 (!) 162/74  Pulse: 83 93 91 88  Resp: 20 19 17 18   Temp: 98.1 F (36.7 C) 98.3 F (36.8 C) 98.2 F (36.8 C)   TempSrc: Oral Axillary Axillary   SpO2: 100% 97% 97% 98%  Weight:      Height:        Exam:  adult male in bed in NAD   NCAT  Chest anteriorly unlabored at rest on room air S1S2 no rub   Abd soft nt obese habitus   Ext no LE edema   Neuro awake on arrival; oriented to person; states is 2016 and does know location of [facility] - no agitation or anxiety on my exam      R IJ TDC and L arm AVF+bruit and thrill      CXR 9/23 - patchy perihilar/ basilar dz, no edema      OP HD: [facility] MWF   4h 71min  87kg  2/2 bath  TDC / L AVF  Hep none  -Mircera 150 mcg IV q 2 weeks (last dose 02/09/2020) -Venofer 100 mg IV X 8 doses (not started yet) -Calcitriol 1.5 mcg PO TIW  Assessment/ Plan: 1. COVID 19-recent diagnosis. Tested positive at [facility] 09/18 and again here.  Per primary team. 2. Acute hypoxic resp failure.  At one point required mechanical ventilation and now extubated; per primary team; optimize volume status with HD 3. AMS - agitation - is better; per primary 4. Recent epidural abscess/MRSA cervical discitis-  S/p decompression surgery 01/06/20. S/p Vancomycin and Rifampin here through 10/01 per charting but rifampin off a bit early due to LFT's per charting. s/p cefazolin. abx per primary team discretion.  5.  ESRD - MWF HD schedule.  Labs today not available but potassium has been acceptable.  Note no heparin with HD as  HIT 6.  HD access - L BC AVF placed Feb 2021. Was used starting in May and had infiltration in Aug prompting [facility] placement to rest the AVF. Using [facility] here and first attempted AVF again on 10/1 - worked well.  May be able to get tunneled catheter out soon 7.  Hypertension/volume - Previously on high dose midodrine here and this is now off.  UF with HD as tolerated. (previously on anti-hypertensives at home) 8. Anemia  - aranesp increased to 150 mcg for 9/29 onward for now. Hold OP Fe load.  s/p PRBC's 9/29 and may need again soon  9.  Metabolic bone disease - off renvela due to hypophosphatemia and improved last check. Phos in AM. Anticipate need to resume at a lower dose per trends. on calcitriol - corrected Ca ok 10.  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 no heparin with HD (0.601 and upper limit of normal 0.4).  Currently on bivalirudin per primary team.  He had been refusing lab draws at one point.   Recent Labs  Lab 02/29/20 1830 03/01/20 0257 03/02/20 1202 03/02/20  1202 03/03/20 0929 03/04/20 1555  K  --    < > 4.2   < > 4.2 3.9  BUN  --    < > 45*   < > 57* 38*  CREATININE  --    < > 6.04*   < > 7.54* 5.89*  CALCIUM  --    < > 7.9*   < > 7.9* 7.9*  PHOS 2.0*  --  2.5  --   --   --   HGB  --    < > 8.2*   < > 7.6* 7.6*   < > = values in this interval not displayed.   Inpatient medications: . calcitRIOL  1.5 mcg Per Tube Q M,W,F-1800  . chlorhexidine  15 mL Mouth Rinse BID  . Chlorhexidine Gluconate Cloth  6 each Topical Q0600  . Chlorhexidine Gluconate Cloth  6 each Topical Q0600  . Chlorhexidine Gluconate Cloth  6 each Topical Q0600  . darbepoetin (ARANESP) injection - DIALYSIS  150 mcg Intravenous Q Wed-HD  . doxycycline  100 mg Per Tube Q12H  . insulin aspart  0-5 Units Subcutaneous QHS  . insulin aspart  0-9 Units Subcutaneous TID WC  . insulin detemir  5 Units Subcutaneous Daily  . mouth rinse  15 mL Mouth Rinse q12n4p  . multivitamin  1  tablet Per Tube QHS  . pantoprazole sodium  40 mg Per Tube Daily  . QUEtiapine  12.5 mg Per Tube QHS  . sodium chloride flush  3 mL Intravenous Q12H  . Zinc Oxide   Topical TID  . zinc sulfate  220 mg Per Tube Daily   . sodium chloride    . sodium chloride    . sodium chloride Stopped (02/21/20 2227)  . sodium chloride    . bivalirudin (ANGIOMAX) infusion 0.5 mg/mL (Non-ACS indications) 0.05 mg/kg/hr (03/05/20 0619)   sodium chloride, sodium chloride, sodium chloride, sodium chloride, alteplase, fentaNYL (SUBLIMAZE) injection, haloperidol lactate, lidocaine (PF), lidocaine-prilocaine, lip balm, metoprolol tartrate, ondansetron **OR** ondansetron (ZOFRAN) IV, pentafluoroprop-tetrafluoroeth, Resource ThickenUp Clear, sodium chloride flush   Claudia Desanctis, MD 03/05/2020  9:57 AM

## 2020-03-05 NOTE — Progress Notes (Addendum)
PROGRESS NOTE                                                                                                                                                                                                             Patient Demographics:    [name], is a 69 y.o. male, DOB - [birth year], TXM:[id number]  Outpatient Primary MD for the patient is [facility], [facility]   Admit date - 02/20/2020   LOS - 71  Chief Complaint  Patient presents with  . Covid Positive  . AMS       Brief Narrative: Patient is a 69 y.o. male with PMHx of ESRD on HD MWF, DM-2, MRSA bacteremia with epidural abscess s/p C4-T2 laminectomy/fusion on 01/06/2020-on IV vancomycin/rifampin through 10/1-resident of SNF-apparently tested positive for COVID-19 on 9/18-subsequently brought to the ED on 9/19 for worsening confusion (more than baseline)-fever-thought to have acute metabolic encephalopathy from COVID-19 infection and admitted to the hospitalist service.  Further hospital course complicated by development of hypotension/severe encephalopathy requiring intubation and ICU transfer.  See below for further details.  COVID-19 vaccinated status: Unvaccinated  Significant Events: 8/3-8/23>> hospitalized for epidural abscess/MRSA bacteremia-underwent laminectomy-briefly intubated due to altered mental status.  Discharged to SNF 9/18>> COVID-19 positive at SNF 9/19>> Admit to [facility] for fever/confusion 9/22>> obtunded/hypotensive after coming back from hemodialysis-emergently intubated/transferred to ICU 9/30>> transferred back to [facility] 9/30>> SQ heparin stopped-pharmacy consulted for HIT protocol.  Significant studies: 8/6>> Echo: EF> 75% 9/19>>Chest x-ray: No acute abnormality in the lung. 9/19>> CT head: No acute intracranial abnormality 9/19>> CT C-spine: Postoperative findings of posterior laminectomy/fusion C4-T1, prevertebral soft tissue edema  appears reduced, postoperative fluid collection overlies laminectomy site does not appear significantly changed compared to prior examination. 9/20>> MRI C-spine: Attempted but due to AMS-not completed 9/21>> MRI C Spine: Osteomyelitis of skull base, anterior C1 and odontoid, C2-C3 facet joint-associated erosion of bone since 8/27-resolved epidural abscess 9/22>> coarsened/heterogeneous appearance of the liver parenchyma-suspicious for cirrhosis, gallbladder not visualized. 9/22>> bilateral lower extremity Doppler: No DVT 9/29>> chest x-ray: Bibasilar infiltrates suggesting multifocal pneumonia  COVID-19 medications: Remdesivir: 9/19>> 9/21  Antibiotics: IV vancomycin plan to continue through October 1 (rifampin stopped 9/21 due to elevated LFTs) Ancef: 9/26>> Zosyn: 9/24>> 9/26  Microbiology data: 9/20 >>blood culture: No growth so far 9/21>> blood culture: No growth so far 9/24>> sputum  culture: E. coli/MSSA  Procedures: ET tube 9/22 > 9/25  Consults: Nephrology PCCM GI  DVT prophylaxis: heparin injection 5,000 Units Start: 02/20/20>> stopped on 9/30 due to worsening thrombocytopenia. SCDs Start: 02/20/20 1628   Subjective:   Patient not very communicative.  Denies any complaints this morning.  States that he is trying to do his best with eating and drinking.     Assessment  & Plan :   Acute hypoxic respiratory failure secondary to HCAP (E. coli/MSSA in sputum culture) and COVID-19 pneumonia Initially admitted and thought to have mild disease-started on steroid/Remdesivir-but decompensated on 9/22-requiring intubation and ICU transfer.  Extubated 9/26-culture results as above-on IV Ancef with end date of 10/1.  No longer on Remdesivir-stopped on 9/22 due to significantly elevated LFTs.    Respiratory status remains stable.  He is saturating normal on room air.  Septic shock, resolved Required pressors while in the ICU-thought to be most likely secondary to [place]  patient has a history of recent MRSA bacteremia/cervical spine osteomyelitis/abscess-this appears to be quiescent and stable.   Acute metabolic encephalopathy Multifactorial etiology-on initial presentation to the hospital was felt to be due to uremia (due to under dialysis-patient got short outpatient HD numerous times), sepsis physiology from COVID-19-however his mental status markedly deteriorated on 9/22-likely due to worsening sepsis physiology from HCAP.  Post extubation-has had issues with delirium/likely related to intubation/ICU stay.  Required Precedex while in the ICU.  Mentation has improved.  He does respond to some questions but takes a long time to respond.  He remains on low-dose Seroquel.  Discussed with his daughter who mentions that patient tends to become noncompliant and become somewhat agitated when his mentation improves.  She wonders if he has underlying psychiatric problems.  No known history of same.  Will request psychiatry to evaluate patient.  He apparently has short-term memory issues-there was some thought as to whether patient has underlying dementia-has a history of alcohol/cocaine use in the past.  Dysphagia Currently getting tube feedings through NG tube.  His dysphagia was most likely due to his severe encephalopathy.  His mentation has improved.  Speech therapy was reconsulted.  Cleared for dysphagia diet.  Patient is NG tube came out yesterday.  Continue to encourage oral intake.  Will avoid replacing the tube as much as possible.    Elevated D-dimer Secondary to COVID-19/multiple procedures while in the ICU/accessing hemodialysis access-no significant hypoxemia-therefore doubt PE-lower extremity Dopplers are negative.   Considering improvement in his respiratory status we will not pursue further work-up at this time.    History of MRSA bacteremia with epidural abscess requiring C4-T2 laminectomy:  MRI C-spine completed on 9/21-appears to have residual  osteomyelitis of the skull base area-have discussed with neurosurgeon-Dr. Kathyrn Sheriff on 9/21-apart from continuing IV antibiotics as previous-no further surgical intervention required.  Plans were to continue vancomycin/rifampin through October 1-however rifampin discontinued due to significantly elevated LFTs.  Patient now off of vancomycin.  Started on doxycycline from 10/1.  Patient will need follow-up with infectious disease clinic post discharge.   Thrombocytopenia Suspect this is multifactorial in etiology from acute illness/sepsis/thrombocytopenia-potentially some contribution from underlying liver cirrhosis-however significant drop from what he first presented with-hence HIT is in the differential Subcutaneous heparin was discontinued.  HIT panel was ordered..  Patient started on bivalirudin in the interim.  However patient has very poor venous access.  It has been very difficult to get PTT levels.  Plus patient has been declining blood draws as well.  We may  run into an issue of not being able to appropriately monitor this anticoagulant so may have to discontinue considering risks and benefits.   The heparin-induced platelet antibody is 0.60.  Slightly above the normal range.  Serotonin release assay is pending. Platelet counts noted to be 62,000 yesterday. Patient's 4 T score was 6.  The heparin-induced antibody is 0.6 as discussed above.  Wait for functional assay to determine if this was HIT or not. .  Normocytic Anemia Secondary to ESRD. Worsened by acute illness-s/p 1 unit of PRBC on 9/29.  Hemoglobin seems to be running between 7 and 8.  No overt bleeding noted.  Continue to monitor.   Shock liver Due to hypotension/sepsis-improving-trend LFTs intermittently.  No further recommendations from GI.  History of HCV/liver cirrhosis HCV antibody positive in the past.  Viral load was done and no hepatitis C quantitative was detected.   Liver ultrasound with suggestion of cirrhotic  appearance.  Will need outpatient GI follow-up.  DM-2 (A1c 6.5 on 8/11):  Appears to have brittle diabetes resulting in hypoglycemic episodes while in the ICU.  Avoid tight glycemic control.  Dose of Levemir was decreased.  CBGs noted to be stable.  Continue to monitor.  ESRD on HD MWF Nephrology following.  Patient being dialyzed as per his usual schedule.  Metabolic acidosis Likely secondary to incomplete HD treatments-resolved with regular HD sessions.  Hyperkalemia Likely secondary to incomplete dialysis treatments-resolved with hemodialysis  Essential hypertension  Tolerate higher blood pressure in this patient who undergoes dialysis.  Continue to monitor.    Hyperlipidemia Statin on hold-should be able to resume when closer to discharge.  Dementia Discussed with patient's daughter.  She is not certain if patient truly has cognitive impairment or dementia.  Will consult psychiatry.  Deconditioning/debility: Secondary to acute illness on top of chronic debility.  PT eval completed-SNF on discharge.  GI prophylaxis: PPI  Condition - Stable  Family Communication  :  Daughter Efe Fazzino [phone])  Code Status :  Full Code  Diet :  Diet Order            DIET DYS 2 Room service appropriate? No; Fluid consistency: Nectar Thick  Diet effective now                  Disposition Plan  :   Status is: Inpatient  Remains inpatient appropriate because:Inpatient level of care appropriate due to severity of illness  Dispo: The patient is from: SNF              Anticipated d/c is to: Home              Anticipated d/c date is: > 3 days              Patient currently is not medically stable to d/c.    Antimicorbials  :    Anti-infectives (From admission, onward)   Start     Dose/Rate Route Frequency Ordered Stop   03/04/20 1000  doxycycline (VIBRA-TABS) tablet 100 mg  Status:  Discontinued        100 mg Oral Every 12 hours 03/01/20 0944 03/04/20 0741   03/04/20  1000  doxycycline (VIBRA-TABS) tablet 100 mg        100 mg Per Tube Every 12 hours 03/04/20 0741     02/27/20 1500  ceFAZolin (ANCEF) IVPB 1 g/50 mL premix        1 g 100 mL/hr over 30 Minutes Intravenous Every 24 hours  02/27/20 1433 03/02/20 1445   02/25/20 1000  piperacillin-tazobactam (ZOSYN) IVPB 2.25 g  Status:  Discontinued        2.25 g 100 mL/hr over 30 Minutes Intravenous Every 8 hours 02/25/20 0905 02/27/20 1433   02/25/20 0830  piperacillin-tazo (ZOSYN) NICU IV syringe 225 mg/mL  Status:  Discontinued        100 mg/kg of piperacillin  84.3 kg 84.4 mL/hr over 30 Minutes Intravenous Every 8 hours 02/25/20 0825 02/25/20 0905   02/25/20 0814  vancomycin (VANCOCIN) 1-5 GM/200ML-% IVPB       Note to Pharmacy: Cherylann Banas   : cabinet override      02/25/20 0814 02/25/20 1158   02/23/20 0642  vancomycin (VANCOCIN) 1-5 GM/200ML-% IVPB       Note to Pharmacy: [place], [place] : cabinet override      02/23/20 0642 02/23/20 1859   02/21/20 1200  vancomycin (VANCOCIN) IVPB 1000 mg/200 mL premix        1,000 mg 200 mL/hr over 60 Minutes Intravenous Every M-W-F (Hemodialysis) 02/20/20 1959 03/03/20 1130   02/21/20 1000  remdesivir 100 mg in sodium chloride 0.9 % 100 mL IVPB  Status:  Discontinued       "Followed by" Linked Group Details   100 mg 200 mL/hr over 30 Minutes Intravenous Daily 02/20/20 1632 02/23/20 0633   02/20/20 2000  rifampin (RIFADIN) capsule 300 mg  Status:  Discontinued        300 mg Oral Every 12 hours 02/20/20 1907 02/23/20 0633   02/20/20 1730  remdesivir 200 mg in sodium chloride 0.9% 250 mL IVPB       "Followed by" Linked Group Details   200 mg 580 mL/hr over 30 Minutes Intravenous Once 02/20/20 1632 02/20/20 1913      Inpatient Medications  Scheduled Meds: . calcitRIOL  1.5 mcg Per Tube Q M,W,F-1800  . chlorhexidine  15 mL Mouth Rinse BID  . Chlorhexidine Gluconate Cloth  6 each Topical Q0600  . Chlorhexidine Gluconate Cloth  6 each Topical Q0600  .  Chlorhexidine Gluconate Cloth  6 each Topical Q0600  . darbepoetin (ARANESP) injection - DIALYSIS  150 mcg Intravenous Q Wed-HD  . doxycycline  100 mg Per Tube Q12H  . insulin aspart  0-5 Units Subcutaneous QHS  . insulin aspart  0-9 Units Subcutaneous TID WC  . insulin detemir  5 Units Subcutaneous Daily  . mouth rinse  15 mL Mouth Rinse q12n4p  . multivitamin  1 tablet Per Tube QHS  . pantoprazole sodium  40 mg Per Tube Daily  . QUEtiapine  12.5 mg Per Tube QHS  . sodium chloride flush  3 mL Intravenous Q12H  . Zinc Oxide   Topical TID  . zinc sulfate  220 mg Per Tube Daily   Continuous Infusions: . sodium chloride    . sodium chloride    . sodium chloride Stopped (02/21/20 2227)  . sodium chloride    . bivalirudin (ANGIOMAX) infusion 0.5 mg/mL (Non-ACS indications) 0.05 mg/kg/hr (03/05/20 0619)   PRN Meds:.sodium chloride, sodium chloride, sodium chloride, sodium chloride, alteplase, fentaNYL (SUBLIMAZE) injection, haloperidol lactate, lidocaine (PF), lidocaine-prilocaine, lip balm, metoprolol tartrate, ondansetron **OR** ondansetron (ZOFRAN) IV, pentafluoroprop-tetrafluoroeth, Resource ThickenUp Clear, sodium chloride flush    Bonnielee Haff M.D on 03/05/2020 at 11:28 AM  To page go to [email or web address] - use universal password  [facility] -  Office  [phone]    Objective:   Vitals:   03/04/20 1321 03/04/20 2022 03/05/20 0420 03/05/20  0634  BP: (!) 152/77 (!) 158/77 (!) 174/57 (!) 162/74  Pulse: 83 93 91 88  Resp: 20 19 17 18   Temp: 98.1 F (36.7 C) 98.3 F (36.8 C) 98.2 F (36.8 C)   TempSrc: Oral Axillary Axillary   SpO2: 100% 97% 97% 98%  Weight:      Height:        Wt Readings from Last 3 Encounters:  01/28/20 86.2 kg  01/24/20 89.4 kg  01/03/20 81.6 kg     Intake/Output Summary (Last 24 hours) at 03/05/2020 1128 Last data filed at 03/05/2020 0900 Gross per 24 hour  Intake 294.53 ml  Output 1000 ml  Net -705.47 ml     Physical  Exam  General appearance: Awake alert.  In no distress.  Not very communicative. Resp: Clear to auscultation bilaterally.  Normal effort Cardio: S1-S2 is normal regular.  No S3-S4.  No rubs murmurs or bruit GI: Abdomen is soft.  Nontender nondistended.  Bowel sounds are present normal.  No masses organomegaly      Data Review:    CBC Recent Labs  Lab 02/29/20 0147 03/01/20 0257 03/01/20 0742 03/01/20 1750 03/02/20 1202 03/03/20 0929 03/04/20 1555  WBC 12.5*  --  8.4  --  8.9 8.8 9.5  HGB 7.6*   < > 6.8* 9.1* 8.2* 7.6* 7.6*  HCT 23.9*   < > 21.2* 29.3* 26.2* 24.2* 24.3*  PLT 107*  --  74*  --  62* 62* 90*  MCV 81.0  --  85.1  --  86.8 86.1 85.6  MCH 25.8*  --  27.3  --  27.2 27.0 26.8  MCHC 31.8  --  32.1  --  31.3 31.4 31.3  RDW 21.7*  --  23.3*  --  23.3* 22.8* 22.9*   < > = values in this interval not displayed.    Chemistries  Recent Labs  Lab 02/28/20 0426 02/28/20 8469 02/29/20 0147 02/29/20 0147 02/29/20 1830 03/01/20 0257 03/01/20 0742 03/02/20 1202 03/03/20 0929 03/04/20 1555  NA  --    < > 136   < >  --  142 139 139 136 134*  K  --    < > 3.4*   < >  --  4.3 4.2 4.2 4.2 3.9  CL  --    < > 98  --   --   --  106 103 103 99  CO2  --    < > 21*  --   --   --  24 26 23 25   GLUCOSE  --    < > 354*  --   --   --  234* 224* 184* 166*  BUN  --    < > 53*  --   --   --  64* 45* 57* 38*  CREATININE  --    < > 5.94*  --   --   --  7.70* 6.04* 7.54* 5.89*  CALCIUM  --    < > 7.7*  --   --   --  7.6* 7.9* 7.9* 7.9*  MG  --   --  2.2  --  2.4  --  2.4  --   --   --   AST 176*  --   --   --   --   --  48* 35 28 32  ALT 251*  --   --   --   --   --  52* 20 11 8  ALKPHOS 104  --   --   --   --   --  127* 108 92 98  BILITOT 1.4*  --   --   --   --   --  0.9 0.8 1.1 1.0   < > = values in this interval not displayed.    Coagulation profile No results for input(s): INR, PROTIME in the last 168 hours.  Recent Labs    03/03/20 0929  DDIMER 6.16*    Micro  Results Recent Results (from the past 240 hour(s))  Culture, respiratory (non-expectorated)     Status: None   Collection Time: 02/25/20  9:31 AM   Specimen: Tracheal Aspirate; Respiratory  Result Value Ref Range Status   Specimen Description TRACHEAL ASPIRATE  Final   Special Requests NONE  Final   Gram Stain   Final    MODERATE WBC PRESENT,BOTH PMN AND MONONUCLEAR ABUNDANT GRAM POSITIVE COCCI ABUNDANT GRAM NEGATIVE RODS FEW GRAM POSITIVE RODS Performed at [facility], [place] [address], [place], [place] [address]    Culture   Final    ABUNDANT ESCHERICHIA COLI MODERATE STAPHYLOCOCCUS AUREUS    Report Status 03/01/2020 FINAL  Final   Organism ID, Bacteria ESCHERICHIA COLI  Final   Organism ID, Bacteria STAPHYLOCOCCUS AUREUS  Final      Susceptibility   Escherichia coli - MIC*    AMPICILLIN >=32 RESISTANT Resistant     CEFAZOLIN <=4 SENSITIVE Sensitive     CEFEPIME <=0.12 SENSITIVE Sensitive     CEFTAZIDIME <=1 SENSITIVE Sensitive     CEFTRIAXONE <=0.25 SENSITIVE Sensitive     CIPROFLOXACIN <=0.25 SENSITIVE Sensitive     GENTAMICIN <=1 SENSITIVE Sensitive     IMIPENEM <=0.25 SENSITIVE Sensitive     TRIMETH/SULFA <=20 SENSITIVE Sensitive     AMPICILLIN/SULBACTAM >=32 RESISTANT Resistant     PIP/TAZO <=4 SENSITIVE Sensitive     * ABUNDANT ESCHERICHIA COLI   Staphylococcus aureus - MIC*    CIPROFLOXACIN <=0.5 SENSITIVE Sensitive     ERYTHROMYCIN >=8 RESISTANT Resistant     GENTAMICIN <=0.5 SENSITIVE Sensitive     OXACILLIN <=0.25 SENSITIVE Sensitive     TETRACYCLINE <=1 SENSITIVE Sensitive     VANCOMYCIN <=0.5 SENSITIVE Sensitive     TRIMETH/SULFA <=10 SENSITIVE Sensitive     CLINDAMYCIN RESISTANT Resistant     RIFAMPIN >=32 RESISTANT Resistant     Inducible Clindamycin POSITIVE Resistant     * MODERATE STAPHYLOCOCCUS AUREUS    Radiology Reports CT Head Wo Contrast  Result Date: 02/20/2020 CLINICAL DATA:  Delirium.  COVID-19 positive.  Recent neck surgery. EXAM:  CT HEAD WITHOUT CONTRAST TECHNIQUE: Contiguous axial images were obtained from the base of the skull through the vertex without intravenous contrast. COMPARISON:  January 28, 2020 FINDINGS: Brain: No subdural, epidural, or subarachnoid hemorrhage. A lacunar infarct in the right thalamus is stable and nonacute. Moderate white matter changes again identified. No acute cortical ischemia or acute infarct noted. No mass effect or midline shift. Ventricles and sulci are unchanged. Cerebellum, brainstem, and basal cisterns are normal. Vascular: Calcified atherosclerosis in the intracranial carotids. Skull: Normal. Negative for fracture or focal lesion. Sinuses/Orbits: Mild scattered mucosal thickening in the ethmoid air cells and left maxillary sinus. No air-fluid levels. Mastoid air cells and middle ears are well aerated. Other: None. IMPRESSION: 1. No acute intracranial abnormalities. Chronic white matter changes. Chronic right thalamic lacunar infarct. 2. Mild sinus disease as above. Electronically Signed   By: Dorise Bullion III M.D  On: 02/20/2020 14:49   CT Cervical Spine Wo Contrast  Result Date: 02/20/2020 CLINICAL DATA:  COVID positive, recent neck surgery EXAM: CT CERVICAL SPINE WITHOUT CONTRAST TECHNIQUE: Multidetector CT imaging of the cervical spine was performed without intravenous contrast. Multiplanar CT image reconstructions were also generated. COMPARISON:  CT cervical spine, 01/28/2020 FINDINGS: Alignment: Normal. Skull base and vertebrae: No acute fracture. No primary bone lesion. Erosive change of the dens and adjacent transverse process of C1 are not significantly changed compared to prior examination. Soft tissues and spinal canal: Assessment of the soft tissues is somewhat limited by metallic streak artifact and lack of intravenous contrast; within this limitation, no unexpected fluid collection overlying the laminectomy site. Prevertebral soft tissue edema appears reduced compared to prior  examination. No visible canal hematoma. Disc levels: Status post posterior laminectomy and fusion of C4 through T1, with moderate disc space height loss and osteophytosis of C6-C7 and C7-T1. Upper chest: Negative. Other: None. IMPRESSION: 1. Redemonstrated postoperative findings status post posterior laminectomy and fusion of C4 through T1, with moderate disc space height loss and osteophytosis of C6-C7 and C7-T1. 2. Prevertebral soft tissue edema appears reduced compared to prior examination. 3. Postoperative fluid collection overlying laminectomy site does not appear significantly changed compared to prior examination. 4. Assessment of the soft tissues and epidural space is generally limited on noncontrast CT and additionally limited by metallic streak artifact; contrast enhanced MRI is the test of choice for evaluation of edema, fluid collections, the epidural space, and discitis osteomyelitis if clinically suspected in the postoperative setting. Electronically Signed   By: Eddie Candle M.D.   On: 02/20/2020 14:53   MR CERVICAL SPINE WO CONTRAST  Result Date: 02/22/2020 CLINICAL DATA:  69 year old male with history of dorsal cervical epidural abscess, status post operative drainage and decompression last month on 01/06/2020. Follow-up MRI 01/29/2020 with new left occipital condyle, anterior C1 and odontoid marrow edema and erosion compatible with skull base osteomyelitis. End stage renal disease on dialysis. Confusion. Positive COVID-19. EXAM: MRI CERVICAL SPINE WITHOUT CONTRAST TECHNIQUE: Multiplanar, multisequence MR imaging of the cervical spine was performed. No intravenous contrast was administered. COMPARISON:  Recent CT head and cervical spine 02/20/2020. Postoperative MRI 01/29/2020. Preoperative MRI FINDINGS: The examination had to be discontinued just prior to completion due to confusion, agitation. Only axial GRE imaging was not obtained. Alignment: Straightening and mild reversal of cervical  lordosis remains improved since 01/04/2020. Vertebrae: Abnormal marrow edema and/or T2 and STIR hyperintense soft tissue surrounding the odontoid (series 3, image 7) which has been partially eroded along with the adjacent clivus and anterior C1 ring as seen on the recent CT. Mild marrow edema in both occipital condyles, greater on the left. Faint marrow edema also in the C2-C3 facets, mostly on the right (series 3, image 2). Overall the appearance has not progressed since 01/22/2020. Sequelae of posterior decompression and fusion from C3 through T1. Mild hardware susceptibility artifact. No other cervical or upper thoracic marrow edema or acute osseous abnormality. Cord: Suboptimal cord detail due to motion and hardware susceptibility. No definite cervical or upper thoracic spinal cord signal abnormality. Normalized epidural space since August. Posterior Fossa, vertebral arteries, paraspinal tissues: Major vascular flow voids are preserved in the neck. Postoperative changes to the posterior neck soft tissues with no adverse features. Negative visible lung apices. Cervicomedullary junction and visible posterior fossa are stable and within normal limits. Disc levels: C2-C3: Improved thecal sac patency since 01/04/2020. Foraminal endplate spurring and moderate bilateral facet degeneration. Mild  residual spinal stenosis. Mild bilateral C3 foraminal stenosis. C3-C4: Chronic central disc protrusion (series 2, image 7) with mild facet and ligament flavum hypertrophy. Improved thecal sac patency since 01/04/2020 but residual mild spinal stenosis and mild cord mass effect. Moderate bilateral C4 foraminal stenosis. C4-C5 through T1-T2: Posterior decompression with no significant spinal stenosis despite residual disc and endplate degeneration at some levels. There is mild to moderate bilateral C8 foraminal stenosis related to disc, endplate and residual facet hypertrophy at C7-T1. IMPRESSION: 1. Osteomyelitis at the skull  base, anterior C1 and odontoid. Possible involvement of the right C2-C3 facet joint also. Associated erosion of bone since 01/28/2020 as demonstrated by CT two days ago. But on MRI no other significant progression is identified since August; no new levels of involvement. 2. Resolved posterior epidural abscess following surgery last month. Satisfactorily decompressed levels C4-C5 through T1-T2. 3. Improved thecal sac patency but mild residual degenerative spinal stenosis at both C2-C3 and C3-C4. No definite spinal cord signal abnormality. Electronically Signed   By: Genevie Ann M.D.   On: 02/22/2020 11:48   DG CHEST PORT 1 VIEW  Result Date: 03/01/2020 CLINICAL DATA:  Respiratory distress.  COVID. EXAM: PORTABLE CHEST 1 VIEW COMPARISON:  02/24/2020 FINDINGS: The endotracheal tube is been removed in the enteric tube converted for a feeding tube. Enteric tube tip is off the field of view but below the left hemidiaphragm. Right central venous catheter is unchanged in position. No pneumothorax. Cardiac enlargement. Bilateral basilar infiltrates suggesting multifocal pneumonia. IMPRESSION: Bilateral basilar infiltrates suggesting multifocal pneumonia. Electronically Signed   By: Lucienne Capers M.D.   On: 03/01/2020 05:02   DG CHEST PORT 1 VIEW  Result Date: 02/24/2020 CLINICAL DATA:  COVID-19 positive 02/20/2020, respiratory failure, intubated EXAM: PORTABLE CHEST 1 VIEW COMPARISON:  02/23/2020 FINDINGS: Single frontal view of the chest demonstrates endotracheal tube well above carina. Enteric catheter passes below diaphragm tip excluded by collimation. Bilateral internal jugular catheters are unchanged. Cardiac silhouette is stable. Persistent patchy consolidation at the lung bases, unchanged. No effusion or pneumothorax. No acute bony abnormalities. IMPRESSION: 1. Stable support devices. 2. Persistent bibasilar patchy consolidation which could reflect atelectasis or pneumonia. Electronically Signed   By: Randa Ngo M.D.   On: 02/24/2020 18:07   DG CHEST PORT 1 VIEW  Result Date: 02/23/2020 CLINICAL DATA:  Encounter for central line placement EXAM: PORTABLE CHEST 1 VIEW COMPARISON:  02/21/2020 FINDINGS: Endotracheal tube approximately 2 cm above the carina. NG tube enters the stomach with the tip not visualized. Right jugular central venous catheter tip in the right atrium unchanged. New left jugular catheter with the tip in the proximal SVC. No pneumothorax. Mild right lower lobe atelectasis. Negative for edema or effusion IMPRESSION: Left jugular central venous catheter tip in the SVC. No pneumothorax. Mild right lower lobe atelectasis. Endotracheal tube 2 cm above the carina. Electronically Signed   By: Franchot Gallo M.D.   On: 02/23/2020 15:14   DG Chest Port 1 View  Result Date: 02/20/2020 CLINICAL DATA:  COVID EXAM: PORTABLE CHEST 1 VIEW COMPARISON:  01/12/2020 FINDINGS: Interval extubation. Interval placement of large-bore right neck vascular catheter, tip position near the superior cavoatrial junction. The heart size and mediastinal contours are within normal limits. Both lungs are clear. The visualized skeletal structures are unremarkable. IMPRESSION: 1. Interval placement of large-bore right neck vascular catheter, tip position near the superior cavoatrial junction. 2. No acute abnormality of the lungs in AP portable projection. Electronically Signed   By: Cristie Hem  Laqueta Carina M.D.   On: 02/20/2020 13:43   DG Chest Port 1V same Day  Result Date: 03/02/2020 CLINICAL DATA:  Shortness of breath.  Altered mental status. EXAM: PORTABLE CHEST 1 VIEW COMPARISON:  Radiograph yesterday. FINDINGS: Enteric tube remains in place with tip below the diaphragm. Right internal jugular dialysis catheter unchanged. Improving cardiomegaly with stable mediastinal contours. Persistent but improving patchy bibasilar opacities. No pneumothorax. No large pleural effusion. Surgical hardware in the lower cervical spine is partially  included. IMPRESSION: 1. Persistent but improving patchy bibasilar opacities, atelectasis versus pneumonia. 2. Improving cardiomegaly. Electronically Signed   By: Keith Rake M.D.   On: 03/02/2020 15:17   DG Chest Port 1V same Day  Result Date: 02/21/2020 CLINICAL DATA:  Shortness of breath, COVID EXAM: PORTABLE CHEST 1 VIEW COMPARISON:  02/20/2020 FINDINGS: Right dialysis catheter remains in place, unchanged. Heart is normal size. No confluent airspace opacities or effusions. No acute bony abnormality. IMPRESSION: No acute cardiopulmonary disease. Electronically Signed   By: Rolm Baptise M.D.   On: 02/21/2020 13:03   DG Swallowing Func-Speech Pathology  Result Date: 03/03/2020 Objective Swallowing Evaluation: Type of Study: MBS-Modified Barium Swallow Study  Patient Details Name: [name]. MRN: [id number] Date of Birth: 06-06-1950 Today's Date: 03/03/2020 Time: SLP Start Time (ACUTE ONLY): 6546 -SLP Stop Time (ACUTE ONLY): 1418 SLP Time Calculation (min) (ACUTE ONLY): 24 min Past Medical History: Past Medical History: Diagnosis Date . Anemia  . Arthritis   left knee . Chronic kidney disease   acute renal failure/injury requiring short-term HD 2013 . Colon cancer ([place])  . Colon polyp   11/2011 - Polyps identified, biopsy - invasive adenocarinoma . DM II (diabetes mellitus, type II), controlled ([place])   type 2 IDDM x 15 years. A1C 1/09 13.7.  Marland Kitchen 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 12/25/2011 . GERD (gastroesophageal reflux disease)  . Heart murmur  . Hx of ileostomy 10/13/2012 . Hyperlipidemia  . Hypertension   for a few years and resolved in 2013/2014. . Sleep apnea   does not wear CPAP now . Wears glasses  Past Surgical History: Past Surgical History: Procedure Laterality Date . APPLICATION OF WOUND VAC  10/04/5463  Procedure: APPLICATION OF WOUND VAC;  Surgeon: Zenovia Jarred, MD;  Location: [place];  Service: General;  Laterality: N/A; . APPLICATION OF WOUND VAC  02/26/2012   Procedure: APPLICATION OF WOUND VAC;  Surgeon: Imogene Burn. [name], MD;  Location: [place];  Service: General;  Laterality: N/A; . AV FISTULA PLACEMENT Left 07/29/2019  Procedure: ARTERIOVENOUS (AV) FISTULA CREATION;  Surgeon: Marty Heck, MD;  Location: [place];  Service: Vascular;  Laterality: Left; . COLONOSCOPY   . COLONOSCOPY  02/06/2012  Procedure: COLONOSCOPY;  Surgeon: Milus Banister, MD;  Location: [place];  Service: Endoscopy;; . COLONOSCOPY WITH PROPOFOL N/A 06/10/2019  Procedure: COLONOSCOPY WITH PROPOFOL;  Surgeon: Milus Banister, MD;  Location: [facility];  Service: Endoscopy;  Laterality: N/A; . COLOSTOMY REVERSAL   . DRESSING CHANGE UNDER ANESTHESIA  02/18/2012  Procedure: DRESSING CHANGE UNDER ANESTHESIA;  Surgeon: Odis Hollingshead, MD;  Location: [place];  Service: General;  Laterality: N/A; . ESOPHAGOGASTRODUODENOSCOPY (EGD) WITH PROPOFOL N/A 06/10/2019  Procedure: ESOPHAGOGASTRODUODENOSCOPY (EGD) WITH PROPOFOL;  Surgeon: Milus Banister, MD;  Location: [facility];  Service: Endoscopy;  Laterality: N/A; . GANGLION CYST EXCISION  20 YRS AGO  RT ARM . ILEOSTOMY  02/16/2012  Procedure: ILEOSTOMY;  Surgeon: Zenovia Jarred, MD;  Location: [place];  Service:  General;  Laterality: N/A; . ILEOSTOMY CLOSURE N/A 09/02/2012  Procedure: ILEOSTOMY REVERSAL;  Surgeon: Imogene Burn. [name], MD;  Location[facility];  Service: General;  Laterality: N/A; . ILIOSTOMY   . INCISION AND DRAINAGE OF WOUND  02/23/2012  Procedure: IRRIGATION AND DEBRIDEMENT WOUND;  Surgeon: Joyice Faster. Cornett, MD;  Location: [place];  Service: General;  Laterality: N/A; . IR DIALY SHUNT INTRO [place] W/IMG LEFT Left 01/21/2020 . IR FLUORO GUIDE CV LINE RIGHT  01/21/2020 . IR REMOVAL TUN CV CATH W/O FL  01/07/2020 . IR US GUIDE VASC ACCESS LEFT  01/21/2020 . IR US GUIDE VASC ACCESS RIGHT  01/21/2020 . LAPAROTOMY  02/16/2012  Procedure: EXPLORATORY LAPAROTOMY;  Surgeon: Zenovia Jarred, MD;  Location: [place];  Service: General;  Laterality: N/A;   Exploratory Laparotomy with resection of anastomosis . LAPAROTOMY  02/18/2012  Procedure: EXPLORATORY LAPAROTOMY;  Surgeon: Odis Hollingshead, MD;  Location: [place];  Service: General;  Laterality: N/A;  exploratory laparotomy  change of abdominal vac dressing . LAPAROTOMY  02/20/2012  Procedure: EXPLORATORY LAPAROTOMY;  Surgeon: Odis Hollingshead, MD;  Location: [place];  Service: General;  Laterality: N/A; . LAPAROTOMY  02/23/2012  Procedure: EXPLORATORY LAPAROTOMY;  Surgeon: Joyice Faster. Cornett, MD;  Location: [place];  Service: General;  Laterality: N/A;  Irrigation and Debridement of abdominal wound with wound vac change with partial closure . LAPAROTOMY  02/26/2012  Procedure: EXPLORATORY LAPAROTOMY;  Surgeon: Imogene Burn. [name], MD;  Location: [place];  Service: General;  Laterality: N/A;   . PARTIAL COLECTOMY  02/06/2012 . PARTIAL COLECTOMY  02/06/2012  Procedure: PARTIAL COLECTOMY;  Surgeon: Imogene Burn. [name], MD;  Location: [place];  Service: General;  Laterality: N/A;  right partial colectomy . POSTERIOR CERVICAL FUSION/FORAMINOTOMY N/A 01/06/2020  Procedure: CERVICAL FOUR- THORACIC TWO LAMINECTOMY AND FUSION;  Surgeon: Consuella Lose, MD;  Location: [place];  Service: Neurosurgery;  Laterality: N/A;  CERVICAL FOUR- THORACIC TWO LAMINECTOMY AND FUSION . RESECTION SMALL BOWEL / CLOSURE ILEOSTOMY  402/2014  Dr [name] . VACUUM ASSISTED CLOSURE CHANGE  02/20/2012  Procedure: ABDOMINAL VACUUM ASSISTED CLOSURE CHANGE;  Surgeon: Odis Hollingshead, MD;  Location: [place];  Service: General;; . VACUUM ASSISTED CLOSURE CHANGE  02/23/2012  Procedure: ABDOMINAL VACUUM ASSISTED CLOSURE CHANGE;  Surgeon: Joyice Faster. Cornett, MD;  Location: [place];  Service: General;  Laterality: N/A; HPI: Gates Apelles Caylen Yardley. is a 69 y.o. male with medical history significant of GERD, hypertension, hyperlipidemia ESRD on hemodialysis, type 2 diabetes mellitus, anemia of chronic disease, colon cancer status post resection, epidural abscess status post  laminectomy, posterior cervical fusion on 01/06/2020 and MRSA bacteremia and presented emergently for evaluation of confusion and COVID-19 positive. Initial BSE 02/21/20 was limited- needed enouragement, audible swallow, eructation, poor dentition and reduced endurance, Work of breathing was stable. Dys 2/thin recommended. On 9/22 became unresponsive after HD, transferred to ICU and intubated then extubated 9/25. Currently has NGT, encephalopathy improving per MD note and speech to re-eval. CXR Bilateral basilar infiltrates suggesting multifocal pneumonia.  No data recorded Assessment / Plan / Recommendation CHL IP CLINICAL IMPRESSIONS 03/03/2020 Clinical Impression Pt demonstrates mild orpharyngeal dysphagia mostly related to anatomical differences. 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 In addition, initiation of swallow is intermittently late with boluses reaching the pyriform sinuses for up to 5 seconds prior to swallow. His epiglottis at times does not fully invert adding to inadequate protection.  Thin barium was penetrated before and during the swallow remaining superior to the vocal cords. His reflexive throat clear, present most of the time did not clear vestibule and a chin tuck was not effective. Penetration was present once with nectar at end of study. Nectar thick recommended for pt to initiate diet and upgrade to thin from clinical observation with SLP. Dys 2 texture recommended, crush pills, small sips/bites, intermittent throat clear and avoid straws.    SLP Visit Diagnosis Dysphagia, pharyngeal phase (R13.13) Attention and concentration deficit following -- Frontal lobe and executive function deficit following -- Impact on safety and function Moderate aspiration risk   CHL IP TREATMENT RECOMMENDATION 03/03/2020 Treatment Recommendations Therapy as outlined in treatment plan below   Prognosis  03/03/2020 Prognosis for Safe Diet Advancement Good Barriers to Reach Goals -- Barriers/Prognosis Comment -- CHL IP DIET RECOMMENDATION 03/03/2020 SLP Diet Recommendations Dysphagia 2 (Fine chop) solids;Nectar thick liquid Liquid Administration via Cup Medication Administration Crushed with puree Compensations Slow rate;Small sips/bites;Clear throat intermittently Postural Changes Seated upright at 90 degrees   CHL IP OTHER RECOMMENDATIONS 03/03/2020 Recommended Consults -- Oral Care Recommendations Oral care BID Other Recommendations Order thickener from pharmacy   CHL IP FOLLOW UP RECOMMENDATIONS 03/03/2020 Follow up Recommendations (No Data)   CHL IP FREQUENCY AND DURATION 03/03/2020 Speech Therapy Frequency (ACUTE ONLY) min 2x/week Treatment Duration 2 weeks      CHL IP ORAL PHASE 03/03/2020 Oral Phase Impaired Oral - Pudding Teaspoon -- Oral - Pudding Cup -- Oral - Honey Teaspoon -- Oral - Honey Cup -- Oral - Nectar Teaspoon -- Oral - Nectar Cup WFL Oral - Nectar Straw -- Oral - Thin Teaspoon -- Oral - Thin Cup Decreased bolus cohesion Oral - Thin Straw -- Oral - Puree -- Oral - Mech Soft -- Oral - Regular Delayed oral transit Oral - Multi-Consistency -- Oral - Pill -- Oral Phase - Comment --  CHL IP PHARYNGEAL PHASE 03/03/2020 Pharyngeal Phase Impaired Pharyngeal- Pudding Teaspoon -- Pharyngeal -- Pharyngeal- Pudding Cup -- Pharyngeal -- Pharyngeal- Honey Teaspoon -- Pharyngeal -- Pharyngeal- Honey Cup -- Pharyngeal -- Pharyngeal- Nectar Teaspoon -- Pharyngeal -- Pharyngeal- Nectar Cup Delayed swallow initiation-pyriform sinuses;Reduced epiglottic inversion;Reduced airway/laryngeal closure;Penetration/Aspiration during swallow;Pharyngeal residue - valleculae Pharyngeal Material enters airway, remains ABOVE vocal cords and not ejected out Pharyngeal- Nectar Straw -- Pharyngeal -- Pharyngeal- Thin Teaspoon -- Pharyngeal -- Pharyngeal- Thin Cup Delayed swallow initiation-pyriform sinuses;Pharyngeal residue -  valleculae;Pharyngeal residue - pyriform;Penetration/Aspiration before swallow;Reduced airway/laryngeal closure Pharyngeal Material enters airway, remains ABOVE vocal cords and not ejected out Pharyngeal- Thin Straw -- Pharyngeal -- Pharyngeal- Puree -- Pharyngeal -- Pharyngeal- Mechanical Soft -- Pharyngeal -- Pharyngeal- Regular WFL Pharyngeal -- Pharyngeal- Multi-consistency -- Pharyngeal -- Pharyngeal- Pill -- Pharyngeal -- Pharyngeal Comment --  CHL IP CERVICAL ESOPHAGEAL PHASE 03/03/2020 Cervical Esophageal Phase WFL Pudding Teaspoon -- Pudding Cup -- Honey Teaspoon -- Honey Cup -- Nectar Teaspoon -- Nectar Cup -- Nectar Straw -- Thin Teaspoon -- Thin Cup -- Thin Straw -- Puree -- Mechanical Soft -- Regular -- Multi-consistency -- Pill -- Cervical Esophageal Comment -- [place] Siren 03/03/2020, 4:35 PM Orbie Pyo Litaker M.Ed Actor Pager [phone] Office [phone]              VAS [place] LOWER EXTREMITY VENOUS (DVT)  Result Date: 02/23/2020  Lower Venous DVTStudy Indications: Elevated D dimer. Other Indications: + Covid. Risk Factors: None identified. Performing Technologist: Griffin Basil RCT RDMS  Examination Guidelines: A complete evaluation includes B-mode imaging, spectral Doppler,  color Doppler, and power Doppler as needed of all accessible portions of each vessel. Bilateral testing is considered an integral part of a complete examination. Limited examinations for reoccurring indications may be performed as noted. The reflux portion of the exam is performed with the patient in reverse Trendelenburg.  +---------+---------------+---------+-----------+----------+--------------+ RIGHT    CompressibilityPhasicitySpontaneityPropertiesThrombus Aging +---------+---------------+---------+-----------+----------+--------------+ CFV      Full           Yes      Yes                                  +---------+---------------+---------+-----------+----------+--------------+ SFJ      Full                                                        +---------+---------------+---------+-----------+----------+--------------+ FV Prox  Full                                                        +---------+---------------+---------+-----------+----------+--------------+ FV Mid   Full                                                        +---------+---------------+---------+-----------+----------+--------------+ FV DistalFull                                                        +---------+---------------+---------+-----------+----------+--------------+ PFV      Full                                                        +---------+---------------+---------+-----------+----------+--------------+ POP      Full           Yes      Yes                                 +---------+---------------+---------+-----------+----------+--------------+ PTV      Full                                                        +---------+---------------+---------+-----------+----------+--------------+ PERO     Full                                                        +---------+---------------+---------+-----------+----------+--------------+   +---------+---------------+---------+-----------+----------+--------------+  LEFT     CompressibilityPhasicitySpontaneityPropertiesThrombus Aging +---------+---------------+---------+-----------+----------+--------------+ CFV      Full           Yes      Yes                                 +---------+---------------+---------+-----------+----------+--------------+ SFJ      Full                                                        +---------+---------------+---------+-----------+----------+--------------+ FV Prox  Full                                                         +---------+---------------+---------+-----------+----------+--------------+ FV Mid   Full                                                        +---------+---------------+---------+-----------+----------+--------------+ FV DistalFull                                                        +---------+---------------+---------+-----------+----------+--------------+ PFV      Full                                                        +---------+---------------+---------+-----------+----------+--------------+ POP      Full           Yes      Yes                                 +---------+---------------+---------+-----------+----------+--------------+ PTV      Full                                                        +---------+---------------+---------+-----------+----------+--------------+ PERO     Full                                                        +---------+---------------+---------+-----------+----------+--------------+     Summary: RIGHT: - There is no evidence of deep vein thrombosis in the lower extremity.  - No cystic structure found in the popliteal fossa.  LEFT: - There is no evidence of deep vein thrombosis in the lower extremity.  - No  cystic structure found in the popliteal fossa.  *See table(s) above for measurements and observations. Electronically signed by Harold Barban MD on 02/23/2020 at 9:31:43 PM.    Final    US Abdomen Limited RUQ  Result Date: 02/23/2020 CLINICAL DATA:  Transaminitis EXAM: ULTRASOUND ABDOMEN LIMITED RIGHT UPPER QUADRANT COMPARISON:  None. FINDINGS: Gallbladder: The gallbladder was not visualized and may be surgically absent Common bile duct: Diameter: 3 mm Liver: The liver parenchyma is coarsened and heterogeneous. The liver surface appears somewhat nodular there is no discrete hepatic mass. There is suggestion of bidirectional flow within the portal vein Other: The right kidney appears atrophic and echogenic. There are  multiple right-sided renal cysts measuring up to approximately 3.6 cm. IMPRESSION: 1. Gallbladder not visualized. 2. Coarsened and heterogeneous appearance of the liver parenchyma with findings suspicious for underlying cirrhosis. 3. Borderline bidirectional flow within the main portal vein is suggestive of portal hypertension. 4. Echogenic and atrophic appearing right kidney consistent with the patient's history of renal failure. Electronically Signed   By: Constance Holster M.D.   On: 02/23/2020 19:21

## 2020-03-06 ENCOUNTER — Encounter (HOSPITAL_COMMUNITY): Payer: Self-pay | Admitting: Internal Medicine

## 2020-03-06 DIAGNOSIS — G9341 Metabolic encephalopathy: Secondary | ICD-10-CM | POA: Diagnosis not present

## 2020-03-06 DIAGNOSIS — D638 Anemia in other chronic diseases classified elsewhere: Secondary | ICD-10-CM | POA: Diagnosis not present

## 2020-03-06 DIAGNOSIS — D696 Thrombocytopenia, unspecified: Secondary | ICD-10-CM | POA: Diagnosis not present

## 2020-03-06 LAB — GLUCOSE, CAPILLARY
Glucose-Capillary: 118 mg/dL — ABNORMAL HIGH (ref 70–99)
Glucose-Capillary: 136 mg/dL — ABNORMAL HIGH (ref 70–99)
Glucose-Capillary: 103 mg/dL — ABNORMAL HIGH (ref 70–99)
Glucose-Capillary: 209 mg/dL — ABNORMAL HIGH (ref 70–99)
Glucose-Capillary: 151 mg/dL — ABNORMAL HIGH (ref 70–99)

## 2020-03-06 LAB — APTT: aPTT: 65 seconds — ABNORMAL HIGH (ref 24–36)

## 2020-03-06 MED ORDER — LOPERAMIDE HCL 2 MG PO CAPS
4.0000 mg | ORAL_CAPSULE | Freq: Three times a day (TID) | ORAL | Status: AC
  Filled 2020-03-06: qty 2

## 2020-03-06 MED ORDER — NEPRO/CARBSTEADY PO LIQD
237.0000 mL | Freq: Two times a day (BID) | ORAL | Status: DC
  Administered 2020-03-16 – 2020-03-17 (×2): 237 mL via ORAL

## 2020-03-06 MED ORDER — ONDANSETRON HCL 4 MG PO TABS: 4.0000 mg | ORAL_TABLET | Freq: Four times a day (QID) | ORAL | Status: DC | PRN

## 2020-03-06 MED ORDER — PANTOPRAZOLE SODIUM 40 MG PO TBEC
40.0000 mg | DELAYED_RELEASE_TABLET | Freq: Every day | ORAL | Status: DC
  Administered 2020-03-07 – 2020-03-23 (×11): 40 mg via ORAL
  Filled 2020-03-06 (×15): qty 1

## 2020-03-06 MED ORDER — ZINC SULFATE 220 (50 ZN) MG PO CAPS
220.0000 mg | ORAL_CAPSULE | Freq: Every day | ORAL | Status: DC
  Administered 2020-03-07 – 2020-03-20 (×7): 220 mg via ORAL
  Filled 2020-03-06 (×11): qty 1

## 2020-03-06 MED ORDER — ONDANSETRON HCL 4 MG/2ML IJ SOLN: 4.0000 mg | Freq: Four times a day (QID) | INTRAMUSCULAR | Status: DC | PRN

## 2020-03-06 MED ORDER — PANTOPRAZOLE SODIUM 40 MG PO PACK: 40.0000 mg | PACK | Freq: Every day | ORAL | Status: DC

## 2020-03-06 MED ORDER — RENA-VITE PO TABS
1.0000 | ORAL_TABLET | Freq: Every day | ORAL | Status: DC
  Administered 2020-03-10 – 2020-03-19 (×8): 1 via ORAL
  Filled 2020-03-06 (×12): qty 1

## 2020-03-06 MED ORDER — CALCITRIOL 1 MCG/ML PO SOLN
1.5000 ug | ORAL | Status: DC
  Filled 2020-03-06: qty 1.5

## 2020-03-06 MED ORDER — COLLAGENASE 250 UNIT/GM EX OINT
TOPICAL_OINTMENT | Freq: Every day | CUTANEOUS | Status: DC
  Administered 2020-03-09 – 2020-03-11 (×2): 1 via TOPICAL
  Filled 2020-03-06 (×2): qty 30

## 2020-03-06 MED ORDER — QUETIAPINE FUMARATE 25 MG PO TABS: 25.0000 mg | ORAL_TABLET | Freq: Every day | ORAL | Status: DC

## 2020-03-06 MED ORDER — DOXYCYCLINE HYCLATE 100 MG PO TABS
100.0000 mg | ORAL_TABLET | Freq: Two times a day (BID) | ORAL | Status: DC
  Administered 2020-03-07 – 2020-03-20 (×13): 100 mg via ORAL
  Filled 2020-03-06 (×22): qty 1

## 2020-03-06 NOTE — Progress Notes (Signed)
  Speech Language Pathology Treatment: Dysphagia  Patient Details Name: [name]. MRN: [id number] DOB: 11/15/1950 Today's Date: 03/06/2020 Time: 0998-3382 SLP Time Calculation (min) (ACUTE ONLY): 10 min  Assessment / Plan / Recommendation Clinical Impression  Pt seen in room during CRRT with positioning less than optimal. There was questionable aspiration although unable to detect from observation. He consumed nectar thick juice and soft peach with delayed cough noted at end of study. Pt has chronic dysphagia given anatomy that he may be unable to compensate for during times of illness.It is hopeful he can return to thin and increased solid texture prior to leaving hospital however he may need to continue modifications at discharge.    HPI HPI: Stepehn Apelles Nolyn Swab. is a 69 y.o. male with medical history significant of GERD, hypertension, hyperlipidemia ESRD on hemodialysis, type 2 diabetes mellitus, anemia of chronic disease, colon cancer status post resection, epidural abscess status post laminectomy, posterior cervical fusion on 01/06/2020 and MRSA bacteremia and presented emergently for evaluation of confusion and COVID-19 positive. Initial BSE 02/21/20 was limited- needed enouragement, audible swallow, eructation, poor dentition and reduced endurance, Work of breathing was stable. Dys 2/thin recommended. On 9/22 became unresponsive after HD, transferred to ICU and intubated then extubated 9/25. Currently has NGT, encephalopathy improving per MD note and speech to re-eval. CXR Bilateral basilar infiltrates suggesting multifocal pneumonia.      SLP Plan  Continue with current plan of care       Recommendations  Diet recommendations: Dysphagia 2 (fine chop);Nectar-thick liquid Liquids provided via: Cup Medication Administration: Whole meds with puree Supervision: Patient able to self feed;Full supervision/cueing for compensatory strategies Compensations: Slow rate;Small  sips/bites;Clear throat intermittently Postural Changes and/or Swallow Maneuvers: Seated upright 90 degrees                Oral Care Recommendations: Oral care BID Follow up Recommendations: 24 hour supervision/assistance SLP Visit Diagnosis: Dysphagia, unspecified (R13.10) Plan: Continue with current plan of care                       Houston Siren 03/06/2020, 1:44 PM  Orbie Pyo Colvin Caroli.Ed Risk analyst [phone] Office [phone]

## 2020-03-06 NOTE — Progress Notes (Signed)
Pt still refusing medication and wound care at this time. Provider has been notified.

## 2020-03-06 NOTE — Progress Notes (Signed)
[facility] Progress Note  Subjective:   Patient not examined today directly given COVID-19 + status, utilizing data taken from chart +/- discussions w/ providers and staff.    Vitals:   03/06/20 1030 03/06/20 1100 03/06/20 1130 03/06/20 1200  BP: (!) 151/70 134/78 (!) 157/74 (!) 156/73  Pulse: 94 95 98 99  Resp:      Temp:      TempSrc:    Axillary  SpO2:      Weight:    94.1 kg  Height:        Exam:  Patient not examined today directly given COVID-19 + status, utilizing data taken from chart +/- discussions w/ providers and staff.       CXR 9/23 - patchy perihilar/ basilar dz, no edema      OP HD: [facility] MWF   4h 2min  87kg  2/2 bath  TDC / L AVF  Hep none  -Mircera 150 mcg IV q 2 weeks (last dose 02/09/2020) -Venofer 100 mg IV X 8 doses (not started yet) -Calcitriol 1.5 mcg PO TIW  Assessment/ Plan: 1. COVID 19-recent diagnosis. Tested positive at [facility] 09/18 and again here.  Per primary team. 2. Acute hypoxic resp failure.  At one point required mechanical ventilation and now extubated; per primary team; optimize volume status with HD 3. AMS - agitation - is better; per primary 4. Recent epidural abscess/MRSA cervical discitis-  S/p decompression surgery 01/06/20. S/p Vancomycin and Rifampin here through 10/01 per charting but rifampin off a bit early due to LFT's per charting. s/p cefazolin. abx per primary team discretion.  5.  ESRD - MWF HD here.  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 HD today.  6.  HD access - L BC AVF placed Feb 2021. Was used starting in May and had infiltration in Aug prompting [facility] placement to rest the AVF. Using [facility] here and first attempted AVF on 10/1 and used it again today on 10/4. After next AVF use will see if we can get the tunneled catheter out 7.  Hypertension/volume - Previously on high dose midodrine here and this is now off.  Keep even on HD (previously on anti-hypertensives at home) 8. Anemia  - aranesp increased to 150 mcg for  9/29 onward for now. Hold OP Fe load.  s/p PRBC's 9/29 and may need again soon  9.  Metabolic bone disease - off renvela due to hypophosphatemia and improved last check. Phos in AM. Anticipate need to resume at a lower dose per trends. on calcitriol - corrected Ca ok 10.  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 no heparin with HD (0.601 and upper limit of normal 0.4).  Currently on bivalirudin per primary team.  He had been refusing lab draws at one point.  Kelly Splinter, MD 03/06/2020, 4:18 PM       Recent Labs  Lab 02/29/20 1830 03/01/20 0257 03/02/20 1202 03/03/20 0929 03/04/20 1555 03/05/20 1043  K  --    < > 4.2   < > 3.9 4.0  BUN  --    < > 45*   < > 38* 49*  CREATININE  --    < > 6.04*   < > 5.89* 6.92*  CALCIUM  --    < > 7.9*   < > 7.9* 8.2*  PHOS 2.0*  --  2.5  --   --   --   HGB  --    < >  8.2*   < > 7.6* 8.1*   < > = values in this interval not displayed.   Inpatient medications: . calcitRIOL  1.5 mcg Per Tube Q M,W,F-1800  . chlorhexidine  15 mL Mouth Rinse BID  . Chlorhexidine Gluconate Cloth  6 each Topical Q0600  . Chlorhexidine Gluconate Cloth  6 each Topical Q0600  . Chlorhexidine Gluconate Cloth  6 each Topical Q0600  . Chlorhexidine Gluconate Cloth  6 each Topical Q0600  . collagenase   Topical Daily  . darbepoetin (ARANESP) injection - DIALYSIS  150 mcg Intravenous Q Wed-HD  . doxycycline  100 mg Per Tube Q12H  . feeding supplement (NEPRO CARB STEADY)  237 mL Oral BID BM  . insulin aspart  0-5 Units Subcutaneous QHS  . insulin aspart  0-9 Units Subcutaneous TID WC  . insulin detemir  5 Units Subcutaneous Daily  . loperamide  4 mg Oral TID  . mouth rinse  15 mL Mouth Rinse q12n4p  . multivitamin  1 tablet Per Tube QHS  . pantoprazole sodium  40 mg Per Tube Daily  . QUEtiapine  12.5 mg Per Tube QHS  . sodium chloride flush  3 mL Intravenous Q12H  . zinc sulfate  220 mg Per Tube Daily   . sodium chloride Stopped (02/21/20  2227)  . sodium chloride    . bivalirudin (ANGIOMAX) infusion 0.5 mg/mL (Non-ACS indications) 0.05 mg/kg/hr (03/05/20 0619)   sodium chloride, sodium chloride, fentaNYL (SUBLIMAZE) injection, haloperidol lactate, lip balm, loperamide, metoprolol tartrate, ondansetron **OR** ondansetron (ZOFRAN) IV, Resource ThickenUp Clear, sodium chloride flush   Sol Blazing, MD 03/06/2020  4:15 PM

## 2020-03-06 NOTE — Progress Notes (Signed)
Nutrition Follow-up  DOCUMENTATION CODES:   Non-severe (moderate) malnutrition in context of chronic illness  INTERVENTION:  Provide Nepro Shake po BID, each supplement provides 425 kcal and 19 grams protein.  Encourage adequate PO intake.   NUTRITION DIAGNOSIS:   Moderate Malnutrition related to chronic illness (ESRD on HD; now COVID) as evidenced by moderate fat depletion, moderate muscle depletion; ongoing  GOAL:   Patient will meet greater than or equal to 90% of their needs; progressing  MONITOR:   PO intake, Supplement acceptance, Skin, Weight trends, Labs, I & O's  REASON FOR ASSESSMENT:   Consult Enteral/tube feeding initiation and management  ASSESSMENT:   69 yo male admitted with acute metabolic encephalopathy likely in setting of COVID-19 infection, developed respiratory failure and septic shock requiring transfer to ICU and intubation. PMH includes ESRD on HD, liver cirrhosis, colon cancer s/p hemicolectomy, polysubstance abuse  9/18 COVID+ dx 9/19 Admission 9/22 Agonal breathing post iHD, hypoglycemic, hypotensive, lethargic; transferred to ICU, Intubated 9/25 extubated 9/27 precedex stopped; cortrak placed with tip gastric  10/1 Diet advanced to dysphagia 2 diet with nectar thick liquids 10/2 Cortrak NGT removed  Pt dialyzed today. Pt continues on a dysphagia 2 diet with nectar thick liquids. Meal completion has been 50-75%. RD to order nutritional supplements to aid in caloric and protein needs. Labs and medications reviewed.   Diet Order:   Diet Order            DIET DYS 2 Room service appropriate? No; Fluid consistency: Nectar Thick  Diet effective now                 EDUCATION NEEDS:   Not appropriate for education at this time  Skin:  Skin Assessment: Skin Integrity Issues: Skin Integrity Issues:: Unstageable Unstageable: buttocks  Last BM:  10/4 rectal tube 200 ml  Height:   Ht Readings from Last 1 Encounters:  02/21/20 6' (1.829  m)    Weight:   Wt Readings from Last 1 Encounters:  03/06/20 94.1 kg   BMI:  Body mass index is 28.14 kg/m.  Estimated Nutritional Needs:   Kcal:  2050-2260 kcals  Protein:  130-150 g  Fluid:  1000 mL plus UOP  Corrin Parker, MS, RD, LDN RD pager number/after hours weekend pager number on Amion.

## 2020-03-06 NOTE — Progress Notes (Signed)
PT Cancellation Note  Patient Details Name: [name]. MRN: [id number] DOB: 08-09-1950   Cancelled Treatment:    Reason Eval/Treat Not Completed: Patient at procedure or test/unavailable   Currently in HD;  Will follow up later today as time allows;  Otherwise, will follow up for PT tomorrow;   Thank you,  Roney Marion, PT  Acute Rehabilitation Services Pager [phone] Office [place] 03/06/2020, 10:44 AM

## 2020-03-06 NOTE — Progress Notes (Addendum)
PROGRESS NOTE                                                                                                                                                                                                             Patient Demographics:    [name], is a 69 y.o. male, DOB - 12/27/1950, LPF:[id number]  Outpatient Primary MD for the patient is [facility]   Admit date - 02/20/2020   LOS - 28  Chief Complaint  Patient presents with  . Covid Positive  . AMS       Brief Narrative: Patient is a 69 y.o. male with PMHx of ESRD on HD MWF, DM-2, MRSA bacteremia with epidural abscess s/p C4-T2 laminectomy/fusion on 01/06/2020-on IV vancomycin/rifampin through 10/1-resident of SNF-apparently tested positive for COVID-19 on 9/18-subsequently brought to the ED on 9/19 for worsening confusion (more than baseline)-fever-thought to have acute metabolic encephalopathy from COVID-19 infection and admitted to the hospitalist service.  Further hospital course complicated by development of hypotension/severe encephalopathy requiring intubation and ICU transfer.  See below for further details.  COVID-19 vaccinated status: Unvaccinated  Significant Events: 8/3-8/23>> hospitalized for epidural abscess/MRSA bacteremia-underwent laminectomy-briefly intubated due to altered mental status.  Discharged to SNF 9/18>> COVID-19 positive at SNF 9/19>> Admit to [facility] for fever/confusion 9/22>> obtunded/hypotensive after coming back from hemodialysis-emergently intubated/transferred to ICU 9/30>> transferred back to [facility] 9/30>> SQ heparin stopped-pharmacy consulted for HIT protocol.  Significant studies: 8/6>> Echo: EF> 75% 9/19>>Chest x-ray: No acute abnormality in the lung. 9/19>> CT head: No acute intracranial abnormality 9/19>> CT C-spine: Postoperative findings of posterior laminectomy/fusion C4-T1, prevertebral soft tissue edema  appears reduced, postoperative fluid collection overlies laminectomy site does not appear significantly changed compared to prior examination. 9/20>> MRI C-spine: Attempted but due to AMS-not completed 9/21>> MRI C Spine: Osteomyelitis of skull base, anterior C1 and odontoid, C2-C3 facet joint-associated erosion of bone since 8/27-resolved epidural abscess 9/22>> coarsened/heterogeneous appearance of the liver parenchyma-suspicious for cirrhosis, gallbladder not visualized. 9/22>> bilateral lower extremity Doppler: No DVT 9/29>> chest x-ray: Bibasilar infiltrates suggesting multifocal pneumonia  COVID-19 medications: Remdesivir: 9/19>> 9/21  Antibiotics: IV vancomycin plan to continue through October 1 (rifampin stopped 9/21 due to elevated LFTs) Ancef: 9/26>> Zosyn: 9/24>> 9/26  Microbiology data: 9/20 >>blood culture: No growth so far 9/21>> blood culture: No growth so far 9/24>> sputum  culture: E. coli/MSSA  Procedures: ET tube 9/22 > 9/25  Consults: Nephrology PCCM GI  DVT prophylaxis: heparin injection 5,000 Units Start: 02/20/20>> stopped on 9/30 due to worsening thrombocytopenia. SCDs Start: 02/20/20 1628   Subjective:   Patient reports poor appetite.  Denies any difficulty breathing.  No other issues brought up by the nursing staff.      Assessment  & Plan :   Acute hypoxic respiratory failure secondary to HCAP (E. coli/MSSA in sputum culture) and COVID-19 pneumonia Initially admitted and thought to have mild disease. Started on steroid/Remdesivir. But decompensated on 9/22 requiring intubation and ICU transfer.  Extubated 9/26.  Found to have MSSA in the sputum culture.  Given intravenous cefazolin till 10/1. Remdesivir stopped on 9/22 due to significantly elevated LFTs.    Respiratory status remained stable.  He saturating normally on room air.    Septic shock, resolved Required pressors while in the ICU. Thought to be most likely secondary to HCAP although  patient has a history of recent MRSA bacteremia/cervical spine osteomyelitis/abscess.   Acute metabolic encephalopathy Multifactorial etiology-on initial presentation to the hospital was felt to be due to uremia (due to under dialysis, patient got short outpatient HD numerous times), sepsis physiology from COVID-19. However his mental status markedly deteriorated on 9/22 likely due to worsening sepsis physiology from HCAP.  Post extubation has had issues with delirium/likely related to intubation/ICU stay.  Required Precedex while in the ICU.  Patient mental status has been stable for the last several days.  He remains on low-dose Seroquel.  This issue was discussed with the daughter who mentions that patient tends to become somewhat agitated and becomes noncompliant when his mentation improves.  She wonders if he has underlying psychiatric problems and requesting a psychiatry evaluation which has been ordered.  He apparently has short-term memory issues. There was some thought as to whether patient has underlying dementia. Has a history of alcohol/cocaine use in the past.  Add thiamine.  Dysphagia He was getting tube feedings however the tube came out yesterday.  Encourage oral intake.  Avoid replacing tube as much as possible.  Speech therapy is following as well.     Elevated D-dimer Secondary to COVID-19.  He did not have any significant hypoxia.  Lower extremity Dopplers were negative.  Since he has clinically improved will not pursue any further work-up.  D-dimer was 6.16 on October 1.  We will recheck it on wednesday.  History of MRSA bacteremia with epidural abscess requiring C4-T2 laminectomy:  MRI C-spine completed on 9/21. Appears to have residual osteomyelitis of the skull base area. Was discussed with neurosurgeon Dr. Kathyrn Sheriff on 9/21. No further surgical intervention required.  Plans were to continue vancomycin/rifampin through October 1-however rifampin discontinued due to  significantly elevated LFTs.  Patient now off of vancomycin.  Started on doxycycline from 10/1.  Patient will need follow-up with infectious disease clinic post discharge.  Thrombocytopenia Suspect this is multifactorial in etiology from acute illness/sepsis/thrombocytopenia-potentially some contribution from underlying liver cirrhosis-however significant drop from what he first presented with-hence HIT was in the differential Subcutaneous heparin was discontinued.  HIT panel was ordered..  Patient started on bivalirudin in the interim.  However patient has very poor venous access.  It has been very difficult to get PTT levels.  Plus patient has been declining blood draws as well.  We may run into an issue of not being able to appropriately monitor this anticoagulant so may have to discontinue considering risks and benefits.  The heparin-induced platelet antibody is 0.60.  Slightly above the normal range.  Serotonin release assay is pending. Patient's 4 T score was 6.  The heparin-induced antibody is 0.6 as discussed above.  Wait for functional assay to determine if this was HIT or not.  Platelet counts noted to be 94,000 yesterday.  Normocytic Anemia Secondary to ESRD. Worsened by acute illness-s/p 1 unit of PRBC on 9/29.  Hemoglobin seems to be running between 7 and 8.  No overt bleeding noted.  Continue to monitor.   Shock liver LFTs have normalized.    History of HCV/liver cirrhosis HCV antibody positive in the past.  Viral load was done and no hepatitis C quantitative was detected.   Liver ultrasound with suggestion of cirrhotic appearance.  Will need outpatient GI follow-up.  DM-2 (A1c 6.5 on 8/11):  Appears to have brittle diabetes resulting in hypoglycemic episodes while in the ICU.  Avoid tight glycemic control.  Dose of Levemir was decreased.  CBGs noted to be stable.  Continue to monitor.  ESRD on HD MWF Nephrology following.  Patient being dialyzed as per his usual  schedule.  Metabolic acidosis Likely secondary to incomplete HD treatments. Resolved with regular HD sessions.  Hyperkalemia Likely secondary to incomplete dialysis treatments, resolved with hemodialysis  Essential hypertension  Tolerate higher blood pressure in this patient who undergoes dialysis.  Continue to monitor.    Hyperlipidemia Statin on hold-should be able to resume when closer to discharge.  Dementia Discussed with patient's daughter.  She is not certain if patient truly has cognitive impairment or dementia.  Psychiatry consulted.  Loose stools Currently with a Flexi-Seal.  This was likely due to routine feedings which he is not getting at this time.  Imodium on scheduled basis for 3 doses.  Hopefully we can discontinue the Flexi-Seal soon.    Deconditioning/debility: Secondary to acute illness on top of chronic debility.  PT eval completed-SNF on discharge.  GI prophylaxis: PPI  Condition - Stable  Family Communication  :  Daughter Kalik Hoare [phone])  Code Status :  Full Code  Diet :  Diet Order            DIET DYS 2 Room service appropriate? No; Fluid consistency: Nectar Thick  Diet effective now                  Disposition Plan  : Plan is for the patient to go to skilled nursing facility.  Likely approaching medical stability soon.  Status is: Inpatient  Remains inpatient appropriate because:Inpatient level of care appropriate due to severity of illness  Dispo: The patient is from: SNF              Anticipated d/c is to: SNF              Anticipated d/c date is: Hopefully by the end of this week              Patient currently is not medically stable to d/c.    Antimicorbials  :    Anti-infectives (From admission, onward)   Start     Dose/Rate Route Frequency Ordered Stop   03/04/20 1000  doxycycline (VIBRA-TABS) tablet 100 mg  Status:  Discontinued        100 mg Oral Every 12 hours 03/01/20 0944 03/04/20 0741   03/04/20 1000   doxycycline (VIBRA-TABS) tablet 100 mg        100 mg Per Tube Every 12 hours 03/04/20 0741  02/27/20 1500  ceFAZolin (ANCEF) IVPB 1 g/50 mL premix        1 g 100 mL/hr over 30 Minutes Intravenous Every 24 hours 02/27/20 1433 03/02/20 1445   02/25/20 1000  piperacillin-tazobactam (ZOSYN) IVPB 2.25 g  Status:  Discontinued        2.25 g 100 mL/hr over 30 Minutes Intravenous Every 8 hours 02/25/20 0905 02/27/20 1433   02/25/20 0830  piperacillin-tazo (ZOSYN) NICU IV syringe 225 mg/mL  Status:  Discontinued        100 mg/kg of piperacillin  84.3 kg 84.4 mL/hr over 30 Minutes Intravenous Every 8 hours 02/25/20 0825 02/25/20 0905   02/25/20 0814  vancomycin (VANCOCIN) 1-5 GM/200ML-% IVPB       Note to Pharmacy: Cherylann Banas   : cabinet override      02/25/20 0814 02/25/20 1158   02/23/20 0642  vancomycin (VANCOCIN) 1-5 GM/200ML-% IVPB       Note to Pharmacy: [place], [place] : cabinet override      02/23/20 0642 02/23/20 1859   02/21/20 1200  vancomycin (VANCOCIN) IVPB 1000 mg/200 mL premix        1,000 mg 200 mL/hr over 60 Minutes Intravenous Every M-W-F (Hemodialysis) 02/20/20 1959 03/03/20 1130   02/21/20 1000  remdesivir 100 mg in sodium chloride 0.9 % 100 mL IVPB  Status:  Discontinued       "Followed by" Linked Group Details   100 mg 200 mL/hr over 30 Minutes Intravenous Daily 02/20/20 1632 02/23/20 0633   02/20/20 2000  rifampin (RIFADIN) capsule 300 mg  Status:  Discontinued        300 mg Oral Every 12 hours 02/20/20 1907 02/23/20 0633   02/20/20 1730  remdesivir 200 mg in sodium chloride 0.9% 250 mL IVPB       "Followed by" Linked Group Details   200 mg 580 mL/hr over 30 Minutes Intravenous Once 02/20/20 1632 02/20/20 1913      Inpatient Medications  Scheduled Meds: . calcitRIOL  1.5 mcg Per Tube Q M,W,F-1800  . chlorhexidine  15 mL Mouth Rinse BID  . Chlorhexidine Gluconate Cloth  6 each Topical Q0600  . Chlorhexidine Gluconate Cloth  6 each Topical Q0600  .  Chlorhexidine Gluconate Cloth  6 each Topical Q0600  . Chlorhexidine Gluconate Cloth  6 each Topical Q0600  . collagenase   Topical Daily  . darbepoetin (ARANESP) injection - DIALYSIS  150 mcg Intravenous Q Wed-HD  . doxycycline  100 mg Per Tube Q12H  . insulin aspart  0-5 Units Subcutaneous QHS  . insulin aspart  0-9 Units Subcutaneous TID WC  . insulin detemir  5 Units Subcutaneous Daily  . mouth rinse  15 mL Mouth Rinse q12n4p  . multivitamin  1 tablet Per Tube QHS  . pantoprazole sodium  40 mg Per Tube Daily  . QUEtiapine  12.5 mg Per Tube QHS  . sodium chloride flush  3 mL Intravenous Q12H  . Zinc Oxide   Topical TID  . zinc sulfate  220 mg Per Tube Daily   Continuous Infusions: . sodium chloride    . sodium chloride    . sodium chloride Stopped (02/21/20 2227)  . sodium chloride    . bivalirudin (ANGIOMAX) infusion 0.5 mg/mL (Non-ACS indications) 0.05 mg/kg/hr (03/05/20 0619)   PRN Meds:.sodium chloride, sodium chloride, sodium chloride, sodium chloride, alteplase, fentaNYL (SUBLIMAZE) injection, haloperidol lactate, lidocaine (PF), lidocaine-prilocaine, lip balm, loperamide, metoprolol tartrate, ondansetron **OR** ondansetron (ZOFRAN) IV, pentafluoroprop-tetrafluoroeth, Resource ThickenUp Clear, sodium chloride  flush    Bonnielee Haff M.D on 03/06/2020 at 9:46 AM  To page go to [email or web address] - use universal password  [facility] -  Office  [phone]    Objective:   Vitals:   03/05/20 1633 03/05/20 2159 03/06/20 0506 03/06/20 0930  BP:  (!) 163/68 (!) 177/77 (!) 151/77  Pulse:  94 95 (!) 101  Resp:  19 20 (!) 23  Temp:  97.9 F (36.6 C) 97.8 F (36.6 C)   TempSrc:  Axillary Axillary   SpO2: 99% 100% 99%   Weight:   96.2 kg   Height:        Wt Readings from Last 3 Encounters:  03/06/20 96.2 kg  01/28/20 86.2 kg  01/24/20 89.4 kg     Intake/Output Summary (Last 24 hours) at 03/06/2020 0946 Last data filed at 03/05/2020 1633 Gross per 24 hour   Intake 58.5 ml  Output 400 ml  Net -341.5 ml     Physical Exam  General appearance: Awake alert.  In no distress. Resp: Clear to auscultation bilaterally.  Normal effort Cardio: S1-S2 is normal regular.  No S3-S4.  No rubs murmurs or bruit GI: Abdomen is soft.  Nontender nondistended.  Bowel sounds are present normal.  No masses organomegaly Extremities: No edema.  Neurologic:  No focal neurological deficits.         Data Review:    CBC Recent Labs  Lab 03/01/20 0742 03/01/20 0742 03/01/20 1750 03/02/20 1202 03/03/20 0929 03/04/20 1555 03/05/20 1043  WBC 8.4  --   --  8.9 8.8 9.5 8.3  HGB 6.8*   < > 9.1* 8.2* 7.6* 7.6* 8.1*  HCT 21.2*   < > 29.3* 26.2* 24.2* 24.3* 25.7*  PLT 74*  --   --  62* 62* 90* 94*  MCV 85.1  --   --  86.8 86.1 85.6 85.1  MCH 27.3  --   --  27.2 27.0 26.8 26.8  MCHC 32.1  --   --  31.3 31.4 31.3 31.5  RDW 23.3*  --   --  23.3* 22.8* 22.9* 22.8*   < > = values in this interval not displayed.    Chemistries  Recent Labs  Lab  0000 02/29/20 0147 02/29/20 1830 03/01/20 0257 03/01/20 0742 03/02/20 1202 03/03/20 0929 03/04/20 1555 03/05/20 1043  NA  --  136  --    < > 139 139 136 134* 138  K  --  3.4*  --    < > 4.2 4.2 4.2 3.9 4.0  CL   < > 98  --   --  106 103 103 99 101  CO2   < > 21*  --   --  24 26 23 25 23   GLUCOSE   < > 354*  --   --  234* 224* 184* 166* 134*  BUN   < > 53*  --   --  64* 45* 57* 38* 49*  CREATININE   < > 5.94*  --   --  7.70* 6.04* 7.54* 5.89* 6.92*  CALCIUM   < > 7.7*  --   --  7.6* 7.9* 7.9* 7.9* 8.2*  MG  --  2.2 2.4  --  2.4  --   --   --   --   AST  --   --   --   --  48* 35 28 32 30  ALT  --   --   --   --  52*  20 11 8 9   ALKPHOS  --   --   --   --  127* 108 92 98 93  BILITOT  --   --   --   --  0.9 0.8 1.1 1.0 1.3*   < > = values in this interval not displayed.    Radiology Reports CT Head Wo Contrast  Result Date: 02/20/2020 CLINICAL DATA:  Delirium.  COVID-19 positive.  Recent neck surgery.  EXAM: CT HEAD WITHOUT CONTRAST TECHNIQUE: Contiguous axial images were obtained from the base of the skull through the vertex without intravenous contrast. COMPARISON:  January 28, 2020 FINDINGS: Brain: No subdural, epidural, or subarachnoid hemorrhage. A lacunar infarct in the right thalamus is stable and nonacute. Moderate white matter changes again identified. No acute cortical ischemia or acute infarct noted. No mass effect or midline shift. Ventricles and sulci are unchanged. Cerebellum, brainstem, and basal cisterns are normal. Vascular: Calcified atherosclerosis in the intracranial carotids. Skull: Normal. Negative for fracture or focal lesion. Sinuses/Orbits: Mild scattered mucosal thickening in the ethmoid air cells and left maxillary sinus. No air-fluid levels. Mastoid air cells and middle ears are well aerated. Other: None. IMPRESSION: 1. No acute intracranial abnormalities. Chronic white matter changes. Chronic right thalamic lacunar infarct. 2. Mild sinus disease as above. Electronically Signed   By: Dorise Bullion III M.D   On: 02/20/2020 14:49   CT Cervical Spine Wo Contrast  Result Date: 02/20/2020 CLINICAL DATA:  COVID positive, recent neck surgery EXAM: CT CERVICAL SPINE WITHOUT CONTRAST TECHNIQUE: Multidetector CT imaging of the cervical spine was performed without intravenous contrast. Multiplanar CT image reconstructions were also generated. COMPARISON:  CT cervical spine, 01/28/2020 FINDINGS: Alignment: Normal. Skull base and vertebrae: No acute fracture. No primary bone lesion. Erosive change of the dens and adjacent transverse process of C1 are not significantly changed compared to prior examination. Soft tissues and spinal canal: Assessment of the soft tissues is somewhat limited by metallic streak artifact and lack of intravenous contrast; within this limitation, no unexpected fluid collection overlying the laminectomy site. Prevertebral soft tissue edema appears reduced compared to  prior examination. No visible canal hematoma. Disc levels: Status post posterior laminectomy and fusion of C4 through T1, with moderate disc space height loss and osteophytosis of C6-C7 and C7-T1. Upper chest: Negative. Other: None. IMPRESSION: 1. Redemonstrated postoperative findings status post posterior laminectomy and fusion of C4 through T1, with moderate disc space height loss and osteophytosis of C6-C7 and C7-T1. 2. Prevertebral soft tissue edema appears reduced compared to prior examination. 3. Postoperative fluid collection overlying laminectomy site does not appear significantly changed compared to prior examination. 4. Assessment of the soft tissues and epidural space is generally limited on noncontrast CT and additionally limited by metallic streak artifact; contrast enhanced MRI is the test of choice for evaluation of edema, fluid collections, the epidural space, and discitis osteomyelitis if clinically suspected in the postoperative setting. Electronically Signed   By: Eddie Candle M.D.   On: 02/20/2020 14:53   MR CERVICAL SPINE WO CONTRAST  Result Date: 02/22/2020 CLINICAL DATA:  69 year old male with history of dorsal cervical epidural abscess, status post operative drainage and decompression last month on 01/06/2020. Follow-up MRI 01/29/2020 with new left occipital condyle, anterior C1 and odontoid marrow edema and erosion compatible with skull base osteomyelitis. End stage renal disease on dialysis. Confusion. Positive COVID-19. EXAM: MRI CERVICAL SPINE WITHOUT CONTRAST TECHNIQUE: Multiplanar, multisequence MR imaging of the cervical spine was performed. No intravenous contrast was administered. COMPARISON:  Recent CT head and cervical spine 02/20/2020. Postoperative MRI 01/29/2020. Preoperative MRI FINDINGS: The examination had to be discontinued just prior to completion due to confusion, agitation. Only axial GRE imaging was not obtained. Alignment: Straightening and mild reversal of cervical  lordosis remains improved since 01/04/2020. Vertebrae: Abnormal marrow edema and/or T2 and STIR hyperintense soft tissue surrounding the odontoid (series 3, image 7) which has been partially eroded along with the adjacent clivus and anterior C1 ring as seen on the recent CT. Mild marrow edema in both occipital condyles, greater on the left. Faint marrow edema also in the C2-C3 facets, mostly on the right (series 3, image 2). Overall the appearance has not progressed since 01/22/2020. Sequelae of posterior decompression and fusion from C3 through T1. Mild hardware susceptibility artifact. No other cervical or upper thoracic marrow edema or acute osseous abnormality. Cord: Suboptimal cord detail due to motion and hardware susceptibility. No definite cervical or upper thoracic spinal cord signal abnormality. Normalized epidural space since August. Posterior Fossa, vertebral arteries, paraspinal tissues: Major vascular flow voids are preserved in the neck. Postoperative changes to the posterior neck soft tissues with no adverse features. Negative visible lung apices. Cervicomedullary junction and visible posterior fossa are stable and within normal limits. Disc levels: C2-C3: Improved thecal sac patency since 01/04/2020. Foraminal endplate spurring and moderate bilateral facet degeneration. Mild residual spinal stenosis. Mild bilateral C3 foraminal stenosis. C3-C4: Chronic central disc protrusion (series 2, image 7) with mild facet and ligament flavum hypertrophy. Improved thecal sac patency since 01/04/2020 but residual mild spinal stenosis and mild cord mass effect. Moderate bilateral C4 foraminal stenosis. C4-C5 through T1-T2: Posterior decompression with no significant spinal stenosis despite residual disc and endplate degeneration at some levels. There is mild to moderate bilateral C8 foraminal stenosis related to disc, endplate and residual facet hypertrophy at C7-T1. IMPRESSION: 1. Osteomyelitis at the skull  base, anterior C1 and odontoid. Possible involvement of the right C2-C3 facet joint also. Associated erosion of bone since 01/28/2020 as demonstrated by CT two days ago. But on MRI no other significant progression is identified since August; no new levels of involvement. 2. Resolved posterior epidural abscess following surgery last month. Satisfactorily decompressed levels C4-C5 through T1-T2. 3. Improved thecal sac patency but mild residual degenerative spinal stenosis at both C2-C3 and C3-C4. No definite spinal cord signal abnormality. Electronically Signed   By: Genevie Ann M.D.   On: 02/22/2020 11:48   DG CHEST PORT 1 VIEW  Result Date: 03/01/2020 CLINICAL DATA:  Respiratory distress.  COVID. EXAM: PORTABLE CHEST 1 VIEW COMPARISON:  02/24/2020 FINDINGS: The endotracheal tube is been removed in the enteric tube converted for a feeding tube. Enteric tube tip is off the field of view but below the left hemidiaphragm. Right central venous catheter is unchanged in position. No pneumothorax. Cardiac enlargement. Bilateral basilar infiltrates suggesting multifocal pneumonia. IMPRESSION: Bilateral basilar infiltrates suggesting multifocal pneumonia. Electronically Signed   By: Lucienne Capers M.D.   On: 03/01/2020 05:02   DG CHEST PORT 1 VIEW  Result Date: 02/24/2020 CLINICAL DATA:  COVID-19 positive 02/20/2020, respiratory failure, intubated EXAM: PORTABLE CHEST 1 VIEW COMPARISON:  02/23/2020 FINDINGS: Single frontal view of the chest demonstrates endotracheal tube well above carina. Enteric catheter passes below diaphragm tip excluded by collimation. Bilateral internal jugular catheters are unchanged. Cardiac silhouette is stable. Persistent patchy consolidation at the lung bases, unchanged. No effusion or pneumothorax. No acute bony abnormalities. IMPRESSION: 1. Stable support devices. 2. Persistent bibasilar patchy consolidation which could reflect  atelectasis or pneumonia. Electronically Signed   By: Randa Ngo M.D.   On: 02/24/2020 18:07   DG CHEST PORT 1 VIEW  Result Date: 02/23/2020 CLINICAL DATA:  Encounter for central line placement EXAM: PORTABLE CHEST 1 VIEW COMPARISON:  02/21/2020 FINDINGS: Endotracheal tube approximately 2 cm above the carina. NG tube enters the stomach with the tip not visualized. Right jugular central venous catheter tip in the right atrium unchanged. New left jugular catheter with the tip in the proximal SVC. No pneumothorax. Mild right lower lobe atelectasis. Negative for edema or effusion IMPRESSION: Left jugular central venous catheter tip in the SVC. No pneumothorax. Mild right lower lobe atelectasis. Endotracheal tube 2 cm above the carina. Electronically Signed   By: Franchot Gallo M.D.   On: 02/23/2020 15:14   DG Chest Port 1 View  Result Date: 02/20/2020 CLINICAL DATA:  COVID EXAM: PORTABLE CHEST 1 VIEW COMPARISON:  01/12/2020 FINDINGS: Interval extubation. Interval placement of large-bore right neck vascular catheter, tip position near the superior cavoatrial junction. The heart size and mediastinal contours are within normal limits. Both lungs are clear. The visualized skeletal structures are unremarkable. IMPRESSION: 1. Interval placement of large-bore right neck vascular catheter, tip position near the superior cavoatrial junction. 2. No acute abnormality of the lungs in AP portable projection. Electronically Signed   By: Eddie Candle M.D.   On: 02/20/2020 13:43   DG Chest Port 1V same Day  Result Date: 03/02/2020 CLINICAL DATA:  Shortness of breath.  Altered mental status. EXAM: PORTABLE CHEST 1 VIEW COMPARISON:  Radiograph yesterday. FINDINGS: Enteric tube remains in place with tip below the diaphragm. Right internal jugular dialysis catheter unchanged. Improving cardiomegaly with stable mediastinal contours. Persistent but improving patchy bibasilar opacities. No pneumothorax. No large pleural effusion. Surgical hardware in the lower cervical spine is partially  included. IMPRESSION: 1. Persistent but improving patchy bibasilar opacities, atelectasis versus pneumonia. 2. Improving cardiomegaly. Electronically Signed   By: Keith Rake M.D.   On: 03/02/2020 15:17   DG Chest Port 1V same Day  Result Date: 02/21/2020 CLINICAL DATA:  Shortness of breath, COVID EXAM: PORTABLE CHEST 1 VIEW COMPARISON:  02/20/2020 FINDINGS: Right dialysis catheter remains in place, unchanged. Heart is normal size. No confluent airspace opacities or effusions. No acute bony abnormality. IMPRESSION: No acute cardiopulmonary disease. Electronically Signed   By: Rolm Baptise M.D.   On: 02/21/2020 13:03   DG Swallowing Func-Speech Pathology  Result Date: 03/03/2020 Objective Swallowing Evaluation: Type of Study: MBS-Modified Barium Swallow Study  Patient Details Name: Kavir Savoca. MRN: [id number] Date of Birth: 04-27-1951 Today's Date: 03/03/2020 Time: SLP Start Time (ACUTE ONLY): 2297 -SLP Stop Time (ACUTE ONLY): 1418 SLP Time Calculation (min) (ACUTE ONLY): 24 min Past Medical History: Past Medical History: Diagnosis Date . Anemia  . Arthritis   left knee . Chronic kidney disease   acute renal failure/injury requiring short-term HD 2013 . Colon cancer ([place])  . Colon polyp   11/2011 - Polyps identified, biopsy - invasive adenocarinoma . DM II (diabetes mellitus, type II), controlled ([place])   type 2 IDDM x 15 years. A1C 1/09 13.7.  Marland Kitchen 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 12/25/2011 . GERD (gastroesophageal reflux disease)  . Heart murmur  . Hx of ileostomy 10/13/2012 . Hyperlipidemia  . Hypertension   for a few years and resolved in 2013/2014. . Sleep apnea   does not wear CPAP now . Wears glasses  Past Surgical History: Past Surgical History: Procedure Laterality  Date . APPLICATION OF WOUND VAC  09/03/4740  Procedure: APPLICATION OF WOUND VAC;  Surgeon: Zenovia Jarred, MD;  Location: [place];  Service: General;  Laterality: N/A; . APPLICATION OF WOUND VAC  02/26/2012   Procedure: APPLICATION OF WOUND VAC;  Surgeon: Imogene Burn. [name], MD;  Location: [facility];  Service: General;  Laterality: N/A; . AV FISTULA PLACEMENT Left 07/29/2019  Procedure: ARTERIOVENOUS (AV) FISTULA CREATION;  Surgeon: Marty Heck, MD;  Location: [place];  Service: Vascular;  Laterality: Left; . COLONOSCOPY   . COLONOSCOPY  02/06/2012  Procedure: COLONOSCOPY;  Surgeon: Milus Banister, MD;  Location: [place];  Service: Endoscopy;; . COLONOSCOPY WITH PROPOFOL N/A 06/10/2019  Procedure: COLONOSCOPY WITH PROPOFOL;  Surgeon: Milus Banister, MD;  [facility];  Service: Endoscopy;  Laterality: N/A; . COLOSTOMY REVERSAL   . DRESSING CHANGE UNDER ANESTHESIA  02/18/2012  Procedure: DRESSING CHANGE UNDER ANESTHESIA;  Surgeon: Odis Hollingshead, MD;  Location: [facility];  Service: General;  Laterality: N/A; . ESOPHAGOGASTRODUODENOSCOPY (EGD) WITH PROPOFOL N/A 06/10/2019  Procedure: ESOPHAGOGASTRODUODENOSCOPY (EGD) WITH PROPOFOL;  Surgeon: Milus Banister, MD;  [facility];  Service: Endoscopy;  Laterality: N/A; . GANGLION CYST EXCISION  20 YRS AGO  RT ARM . ILEOSTOMY  02/16/2012  Procedure: ILEOSTOMY;  Surgeon: Zenovia Jarred, MD;  Location: [place];  Service: General;  Laterality: N/A; . ILEOSTOMY CLOSURE N/A 09/02/2012  Procedure: ILEOSTOMY REVERSAL;  Surgeon: Imogene Burn. [name], MD;  Location: [facility];  Service: General;  Laterality: N/A; . ILIOSTOMY   . INCISION AND DRAINAGE OF WOUND  02/23/2012  Procedure: IRRIGATION AND DEBRIDEMENT WOUND;  Surgeon: Joyice Faster. Cornett, MD;  Location: [place];  Service: General;  Laterality: N/A; . IR DIALY SHUNT INTRO [place] W/IMG LEFT Left 01/21/2020 . IR FLUORO GUIDE CV LINE RIGHT  01/21/2020 . IR REMOVAL TUN CV CATH W/O FL  01/07/2020 . IR US GUIDE VASC ACCESS LEFT  01/21/2020 . IR US GUIDE VASC ACCESS RIGHT  01/21/2020 . LAPAROTOMY  02/16/2012  Procedure: EXPLORATORY LAPAROTOMY;  Surgeon: Zenovia Jarred, MD;  Location: [place];  Service: General;  Laterality: N/A;   Exploratory Laparotomy with resection of anastomosis . LAPAROTOMY  02/18/2012  Procedure: EXPLORATORY LAPAROTOMY;  Surgeon: Odis Hollingshead, MD;  Location: [place];  Service: General;  Laterality: N/A;  exploratory laparotomy  change of abdominal vac dressing . LAPAROTOMY  02/20/2012  Procedure: EXPLORATORY LAPAROTOMY;  Surgeon: Odis Hollingshead, MD;  Location: [place];  Service: General;  Laterality: N/A; . LAPAROTOMY  02/23/2012  Procedure: EXPLORATORY LAPAROTOMY;  Surgeon: Joyice Faster. Cornett, MD;  Location: [place];  Service: General;  Laterality: N/A;  Irrigation and Debridement of abdominal wound with wound vac change with partial closure . LAPAROTOMY  02/26/2012  Procedure: EXPLORATORY LAPAROTOMY;  Surgeon: Imogene Burn. [name], MD;  Location: [place];  Service: General;  Laterality: N/A;   . PARTIAL COLECTOMY  02/06/2012 . PARTIAL COLECTOMY  02/06/2012  Procedure: PARTIAL COLECTOMY;  Surgeon: Imogene Burn. [name], MD;  Location: [place];  Service: General;  Laterality: N/A;  right partial colectomy . POSTERIOR CERVICAL FUSION/FORAMINOTOMY N/A 01/06/2020  Procedure: CERVICAL FOUR- THORACIC TWO LAMINECTOMY AND FUSION;  Surgeon: Consuella Lose, MD;  Location: [facility];  Service: Neurosurgery;  Laterality: N/A;  CERVICAL FOUR- THORACIC TWO LAMINECTOMY AND FUSION . RESECTION SMALL BOWEL / CLOSURE ILEOSTOMY  402/2014  Dr [name] . VACUUM ASSISTED CLOSURE CHANGE  02/20/2012  Procedure: ABDOMINAL VACUUM ASSISTED CLOSURE CHANGE;  Surgeon: Odis Hollingshead, MD;  Location: [facility];  Service: General;; . VACUUM ASSISTED CLOSURE CHANGE  02/23/2012  Procedure: ABDOMINAL VACUUM ASSISTED CLOSURE CHANGE;  Surgeon: Marcello Moores A. Cornett, MD;  Location: [facility];  Service: General;  Laterality: N/A; HPI: Jaimen Apelles Artur Winningham. is a 69 y.o. male with medical history significant of GERD, hypertension, hyperlipidemia ESRD on hemodialysis, type 2 diabetes mellitus, anemia of chronic disease, colon cancer status post resection, epidural abscess status post  laminectomy, posterior cervical fusion on 01/06/2020 and MRSA bacteremia and presented emergently for evaluation of confusion and COVID-19 positive. Initial BSE 02/21/20 was limited- needed enouragement, audible swallow, eructation, poor dentition and reduced endurance, Work of breathing was stable. Dys 2/thin recommended. On 9/22 became unresponsive after HD, transferred to ICU and intubated then extubated 9/25. Currently has NGT, encephalopathy improving per MD note and speech to re-eval. CXR Bilateral basilar infiltrates suggesting multifocal pneumonia.  No data recorded Assessment / Plan / Recommendation CHL IP CLINICAL IMPRESSIONS 03/03/2020 Clinical Impression Pt demonstrates mild orpharyngeal dysphagia mostly related to anatomical differences. 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 In addition, initiation of swallow is intermittently late with boluses reaching the pyriform sinuses for up to 5 seconds prior to swallow. His epiglottis at times does not fully invert adding to inadequate protection. Thin barium was penetrated before and during the swallow remaining superior to the vocal cords. His reflexive throat clear, present most of the time did not clear vestibule and a chin tuck was not effective. Penetration was present once with nectar at end of study. Nectar thick recommended for pt to initiate diet and upgrade to thin from clinical observation with SLP. Dys 2 texture recommended, crush pills, small sips/bites, intermittent throat clear and avoid straws.    SLP Visit Diagnosis Dysphagia, pharyngeal phase (R13.13) Attention and concentration deficit following -- Frontal lobe and executive function deficit following -- Impact on safety and function Moderate aspiration risk   CHL IP TREATMENT RECOMMENDATION 03/03/2020 Treatment Recommendations Therapy as outlined in treatment plan below   Prognosis  03/03/2020 Prognosis for Safe Diet Advancement Good Barriers to Reach Goals -- Barriers/Prognosis Comment -- CHL IP DIET RECOMMENDATION 03/03/2020 SLP Diet Recommendations Dysphagia 2 (Fine chop) solids;Nectar thick liquid Liquid Administration via Cup Medication Administration Crushed with puree Compensations Slow rate;Small sips/bites;Clear throat intermittently Postural Changes Seated upright at 90 degrees   CHL IP OTHER RECOMMENDATIONS 03/03/2020 Recommended Consults -- Oral Care Recommendations Oral care BID Other Recommendations Order thickener from pharmacy   CHL IP FOLLOW UP RECOMMENDATIONS 03/03/2020 Follow up Recommendations (No Data)   CHL IP FREQUENCY AND DURATION 03/03/2020 Speech Therapy Frequency (ACUTE ONLY) min 2x/week Treatment Duration 2 weeks      CHL IP ORAL PHASE 03/03/2020 Oral Phase Impaired Oral - Pudding Teaspoon -- Oral - Pudding Cup -- Oral - Honey Teaspoon -- Oral - Honey Cup -- Oral - Nectar Teaspoon -- Oral - Nectar Cup WFL Oral - Nectar Straw -- Oral - Thin Teaspoon -- Oral - Thin Cup Decreased bolus cohesion Oral - Thin Straw -- Oral - Puree -- Oral - Mech Soft -- Oral - Regular Delayed oral transit Oral - Multi-Consistency -- Oral - Pill -- Oral Phase - Comment --  CHL IP PHARYNGEAL PHASE 03/03/2020 Pharyngeal Phase Impaired Pharyngeal- Pudding Teaspoon -- Pharyngeal -- Pharyngeal- Pudding Cup -- Pharyngeal -- Pharyngeal- Honey Teaspoon -- Pharyngeal -- Pharyngeal- Honey Cup -- Pharyngeal -- Pharyngeal- Nectar Teaspoon -- Pharyngeal -- Pharyngeal- Nectar Cup  Delayed swallow initiation-pyriform sinuses;Reduced epiglottic inversion;Reduced airway/laryngeal closure;Penetration/Aspiration during swallow;Pharyngeal residue - valleculae Pharyngeal Material enters airway, remains ABOVE vocal cords and not ejected out Pharyngeal- Nectar Straw -- Pharyngeal -- Pharyngeal- Thin Teaspoon -- Pharyngeal -- Pharyngeal- Thin Cup Delayed swallow initiation-pyriform sinuses;Pharyngeal residue -  valleculae;Pharyngeal residue - pyriform;Penetration/Aspiration before swallow;Reduced airway/laryngeal closure Pharyngeal Material enters airway, remains ABOVE vocal cords and not ejected out Pharyngeal- Thin Straw -- Pharyngeal -- Pharyngeal- Puree -- Pharyngeal -- Pharyngeal- Mechanical Soft -- Pharyngeal -- Pharyngeal- Regular WFL Pharyngeal -- Pharyngeal- Multi-consistency -- Pharyngeal -- Pharyngeal- Pill -- Pharyngeal -- Pharyngeal Comment --  CHL IP CERVICAL ESOPHAGEAL PHASE 03/03/2020 Cervical Esophageal Phase WFL Pudding Teaspoon -- Pudding Cup -- Honey Teaspoon -- Honey Cup -- Nectar Teaspoon -- Nectar Cup -- Nectar Straw -- Thin Teaspoon -- Thin Cup -- Thin Straw -- Puree -- Mechanical Soft -- Regular -- Multi-consistency -- Pill -- Cervical Esophageal Comment -- [place] Siren 03/03/2020, 4:35 PM Orbie Pyo Litaker M.Ed Actor Pager [phone] Office [phone]              VAS [place] LOWER EXTREMITY VENOUS (DVT)  Result Date: 02/23/2020  Lower Venous DVTStudy Indications: Elevated D dimer. Other Indications: + Covid. Risk Factors: None identified. Performing Technologist: Griffin Basil RCT RDMS  Examination Guidelines: A complete evaluation includes B-mode imaging, spectral Doppler, color Doppler, and power Doppler as needed of all accessible portions of each vessel. Bilateral testing is considered an integral part of a complete examination. Limited examinations for reoccurring indications may be performed as noted. The reflux portion of the exam is performed with the patient in reverse Trendelenburg.  +---------+---------------+---------+-----------+----------+--------------+ RIGHT    CompressibilityPhasicitySpontaneityPropertiesThrombus Aging +---------+---------------+---------+-----------+----------+--------------+ CFV      Full           Yes      Yes                                  +---------+---------------+---------+-----------+----------+--------------+ SFJ      Full                                                        +---------+---------------+---------+-----------+----------+--------------+ FV Prox  Full                                                        +---------+---------------+---------+-----------+----------+--------------+ FV Mid   Full                                                        +---------+---------------+---------+-----------+----------+--------------+ FV DistalFull                                                        +---------+---------------+---------+-----------+----------+--------------+ PFV      Full                                                        +---------+---------------+---------+-----------+----------+--------------+  POP      Full           Yes      Yes                                 +---------+---------------+---------+-----------+----------+--------------+ PTV      Full                                                        +---------+---------------+---------+-----------+----------+--------------+ PERO     Full                                                        +---------+---------------+---------+-----------+----------+--------------+   +---------+---------------+---------+-----------+----------+--------------+ LEFT     CompressibilityPhasicitySpontaneityPropertiesThrombus Aging +---------+---------------+---------+-----------+----------+--------------+ CFV      Full           Yes      Yes                                 +---------+---------------+---------+-----------+----------+--------------+ SFJ      Full                                                        +---------+---------------+---------+-----------+----------+--------------+ FV Prox  Full                                                         +---------+---------------+---------+-----------+----------+--------------+ FV Mid   Full                                                        +---------+---------------+---------+-----------+----------+--------------+ FV DistalFull                                                        +---------+---------------+---------+-----------+----------+--------------+ PFV      Full                                                        +---------+---------------+---------+-----------+----------+--------------+ POP      Full           Yes      Yes                                 +---------+---------------+---------+-----------+----------+--------------+  PTV      Full                                                        +---------+---------------+---------+-----------+----------+--------------+ PERO     Full                                                        +---------+---------------+---------+-----------+----------+--------------+     Summary: RIGHT: - There is no evidence of deep vein thrombosis in the lower extremity.  - No cystic structure found in the popliteal fossa.  LEFT: - There is no evidence of deep vein thrombosis in the lower extremity.  - No cystic structure found in the popliteal fossa.  *See table(s) above for measurements and observations. Electronically signed by Harold Barban MD on 02/23/2020 at 9:31:43 PM.    Final    US Abdomen Limited RUQ  Result Date: 02/23/2020 CLINICAL DATA:  Transaminitis EXAM: ULTRASOUND ABDOMEN LIMITED RIGHT UPPER QUADRANT COMPARISON:  None. FINDINGS: Gallbladder: The gallbladder was not visualized and may be surgically absent Common bile duct: Diameter: 3 mm Liver: The liver parenchyma is coarsened and heterogeneous. The liver surface appears somewhat nodular there is no discrete hepatic mass. There is suggestion of bidirectional flow within the portal vein Other: The right kidney appears atrophic and echogenic. There are  multiple right-sided renal cysts measuring up to approximately 3.6 cm. IMPRESSION: 1. Gallbladder not visualized. 2. Coarsened and heterogeneous appearance of the liver parenchyma with findings suspicious for underlying cirrhosis. 3. Borderline bidirectional flow within the main portal vein is suggestive of portal hypertension. 4. Echogenic and atrophic appearing right kidney consistent with the patient's history of renal failure. Electronically Signed   By: Constance Holster M.D.   On: 02/23/2020 19:21

## 2020-03-06 NOTE — TOC Progression Note (Signed)
Transition of Care (TOC) - Progression Note    Patient Details  Name: [name]. MRN: [id number] Date of Birth: [birth year]  Transition of Care [facility]) CM/SW [place], LCSW Phone Number: 03/06/2020, 12:40 PM  Clinical Narrative:    CSW made [employer] Juliann Pulse) aware that patient will be medically stable to return soon.    Expected Discharge Plan: [place] Barriers to Discharge: Continued Medical Work up  Expected Discharge Plan and Services Expected Discharge Plan: [place] In-house Referral: Clinical Social Work Discharge Planning Services: CM Consult Post Acute Care Choice: Durable Medical Equipment, Home Health Living arrangements for the past 2 months: Apartment                                       Social Determinants of Health (SDOH) Interventions    Readmission Risk Interventions Readmission Risk Prevention Plan 02/22/2020 01/18/2020  Transportation Screening Complete Complete  Medication Review Press photographer) Referral to Pharmacy Complete  PCP or Specialist appointment within 3-5 days of discharge Complete Complete  HRI or [place] Complete Complete  SW Recovery Care/Counseling Consult Complete Complete  Palliative Care Screening Not Applicable Complete  [place] Patient Refused Complete  Some recent data might be hidden

## 2020-03-06 NOTE — Consult Note (Addendum)
[facility] Nurse Consult Note: Reason for Consult: Consult requested for buttocks. Pt is on isolation for Covid and has multiple systemic factors which can impair healing.  WOC consult was previously performed on 9/28 for moisture associated skin damage (MASD) to bilat buttocks with partial thickness skin loss. Pt is frequently incontinent of urine and liquid stool.  A Flexiseal has been inserted to attempt to contain the stool but patient still has leakage surrounding the insertion site. The previously noted MASD has evolved into an Unstageable pressure injury, located across bilat buttocks and sacrum Pressure Injury POA: No Measurement: 9X9cm, crescent-shaped Wound bed:100% tightly adhered yellow slough Drainage (amount, consistency, odor) mod amt tan drainage, no odor or fluctuance Periwound: intact skin surrounding Dressing procedure/placement/frequency: Topical treatment orders provided for bedside nurses to perform as follows to provide enzymatic debridement of nonviable tissue: Apply Santyl to buttocks/sacrum wound Q day, then cover with moist gauze and dry ABD pad and tape. Will plan to begin hydrotherapy by physical therapy once the patient is no longer on isolation for Covid.  [place] team will assess the site weekly and revise the plan of care if indicated at that time.  Julien Girt MSN, RN, [place], [place], [place]

## 2020-03-06 NOTE — Progress Notes (Signed)
Pt removed IV and is refusing to allow IV team to place a new one. Dr. [place] Pink aware.

## 2020-03-06 NOTE — TOC Progression Note (Addendum)
Transition of Care (TOC) - Progression Note    Patient Details  Name: [name]. MRN: [id number] Date of Birth: March 01, 1951  Transition of Care [facility]) CM/SW [place], LCSW Phone Number: 03/06/2020, 5:11 PM  Clinical Narrative:    CSW spoke with patient's daughter, [name], regarding discharge plan back to [facility]. She stated that she did not want patient to return there and wanted a different facility. CSW gently reminded her that patient did not have any other SNF offers, as was stated in our last conversation, though CSW did fax out referral today. She stated that she was told by someone at the hospital that patient would not have to return to [facility] and that we would find a better facility. CSW responded that this writer is not sure who she spoke with, but it is up to facilities whether or not they accept a patient. CSW again made her aware of the barriers (COVID+, dialysis transport needs to [facility], patient refusing care, and that patient only has 12 days left of his Medicare benefits before it becomes an out of pocket cost). She asked about Medicaid again and CSW answered questions. She stated that she found it hard to believe that out of all the facilities in [facility], none had said yes. CSW emailed her a screenshot of the facilities responses in the Epic system:  She asked how many hours an aide would be able to be at home with the patient and CSW explained that it would not be very many but that CSW will contact the [place] to see if their coverage would offer anything extra than Medicare. She does not want to make a discharge decision at this time. CSW will follow up.   UPDATE: Patient's PCP at [facility] is Dr. Ace Gins. Left vm for Education officer, museum. Of note, CSW sent referral to SNFs outside of [facility] as well and did not receive any bed offers.   11:42 AM CSW spoke with [place] SW Supervisor, [name] [phone]). She  reported that patient is not VA service connected and does not qualify for SNF placement through the [place]. However, he is able to receive up to 24 hours of home health aide services, which orders can be faxed to PCP office [phone]. Patient's PCP Social worker is now Rodney W. [phone]. CSW will update patient's daughter.   Expected Discharge Plan: [place] Barriers to Discharge: Continued Medical Work up  Expected Discharge Plan and Services Expected Discharge Plan: [place] In-house Referral: Clinical Social Work Discharge Planning Services: CM Consult Post Acute Care Choice: Durable Medical Equipment, Home Health Living arrangements for the past 2 months: Apartment                                       Social Determinants of Health (SDOH) Interventions    Readmission Risk Interventions Readmission Risk Prevention Plan 02/22/2020 01/18/2020  Transportation Screening Complete Complete  Medication Review Press photographer) Referral to Pharmacy Complete  PCP or Specialist appointment within 3-5 days of discharge Complete Complete  HRI or [place] Complete Complete  SW Recovery Care/Counseling Consult Complete Complete  Palliative Care Screening Not Applicable Complete  [place] Patient Refused Complete  Some recent data might be hidden

## 2020-03-06 NOTE — Plan of Care (Signed)

## 2020-03-06 NOTE — Progress Notes (Signed)
ANTICOAGULATION CONSULT NOTE  Pharmacy Consult for bivalirudin Indication: R/o HIT  Allergies  Allergen Reactions   Heparin     R/o HIT with ab/SRA    Patient Measurements: Height: 6' (182.9 cm) Weight: 94.1 kg (207 lb 7.3 oz) IBW/kg (Calculated) : 77.6  Vital Signs: Temp: 97.8 F (36.6 C) (10/04 0506) Temp Source: Axillary (10/04 1200) BP: 156/73 (10/04 1200) Pulse Rate: 99 (10/04 1200)  Labs: Recent Labs    03/04/20 1555 03/05/20 1043 03/06/20 1045  HGB 7.6* 8.1*  --   HCT 24.3* 25.7*  --   PLT 90* 94*  --   APTT 67* 66* 65*  CREATININE 5.89* 6.92*  --     Estimated Creatinine Clearance: 12.2 mL/min (A) (by C-G formula based on SCr of 6.92 mg/dL (H)).   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 His rifampin was dced after significant increase with LFTs. They have trended down to WNL. He was transferred out from the ICU. His platelets have trended down significantly with a calculated 4T score of 5 (intermediate). Pharmacist discussed with Dr Sloan Leiter on 9/30 and decided to DC any heparin products and start him on the HIT protocol with bivalirudin.   CALCULATE SCORE:  4Ts (see the HIT Algorithm) Score  Thrombocytopenia 2  Timing 2  Thrombosis 0  Other causes of thrombocytopenia 1  Total 5   Possible HIT    Order SRA:  Yes  Discontinue heparin / LMWH:  Yes  Initiate alternative anticoagulation:  Yes  Document heparin allergy:  Yes  APTT remains therapeutic at 65 on bivalirudin 0.05 mg/kg/hr. HITab slightly above normal at 0.601, SRA still in process. CBC improving slowly with Hgb 8.1, Plt 94. No overt bleeding documented. Will continue current regimen.   Goal of Therapy:  aPTT goal: 50-85 sec Monitor platelets by anticoagulation protocol: Yes   Plan:  Continue bivalirudin gtt at 0.05 mg/kg/hr Daily aPTT, CBC, s/s bleeding F/u SRA results   Loyce Flaming A. Levada Dy, PharmD, BCPS, FNKF Clinical Pharmacist [place] Please utilize  Amion for appropriate phone number to reach the unit pharmacist ([place])   03/06/2020 12:15 PM  Please check [email or web address] for unit-specific pharmacy phone numbers.

## 2020-03-06 NOTE — Progress Notes (Signed)
Pt is refusing wound care at this time. Will try again to provide wound care in 1 hour.

## 2020-03-06 NOTE — Consult Note (Signed)
Attempted to do psychiatric consult via tele psych but nursing staff unable to locate ipad for assessment.  Provider will attempt to see tomorrow.

## 2020-03-07 DIAGNOSIS — D638 Anemia in other chronic diseases classified elsewhere: Secondary | ICD-10-CM | POA: Diagnosis not present

## 2020-03-07 DIAGNOSIS — G9341 Metabolic encephalopathy: Secondary | ICD-10-CM | POA: Diagnosis not present

## 2020-03-07 DIAGNOSIS — D696 Thrombocytopenia, unspecified: Secondary | ICD-10-CM | POA: Diagnosis not present

## 2020-03-07 LAB — GLUCOSE, CAPILLARY
Glucose-Capillary: 92 mg/dL (ref 70–99)
Glucose-Capillary: 103 mg/dL — ABNORMAL HIGH (ref 70–99)
Glucose-Capillary: 135 mg/dL — ABNORMAL HIGH (ref 70–99)

## 2020-03-07 LAB — SEROTONIN RELEASE ASSAY (SRA)
SRA .2 IU/mL UFH Ser-aCnc: 1 % (ref 0–20)
SRA 100IU/mL UFH Ser-aCnc: <1 % (ref 0–20)

## 2020-03-07 MED ORDER — CALCITRIOL 0.5 MCG PO CAPS
1.5000 ug | ORAL_CAPSULE | ORAL | Status: DC
  Administered 2020-03-17: 1.5 ug via ORAL
  Filled 2020-03-07 (×5): qty 3

## 2020-03-07 MED ORDER — THIAMINE HCL 100 MG PO TABS
100.0000 mg | ORAL_TABLET | Freq: Every day | ORAL | Status: DC
  Administered 2020-03-08 – 2020-03-20 (×7): 100 mg via ORAL
  Filled 2020-03-07 (×11): qty 1

## 2020-03-07 MED ORDER — QUETIAPINE FUMARATE 25 MG PO TABS
25.0000 mg | ORAL_TABLET | Freq: Two times a day (BID) | ORAL | Status: DC
  Administered 2020-03-08 – 2020-03-10 (×3): 25 mg via ORAL
  Filled 2020-03-07 (×7): qty 1

## 2020-03-07 NOTE — TOC Progression Note (Signed)
Transition of Care (TOC) - Progression Note    Patient Details  Name: [name]. MRN: [id number] Date of Birth: 1950/08/09  Transition of Care [facility]) CM/SW [place], LCSW Phone Number: 03/07/2020, 4:06 PM  Clinical Narrative:    CSW spoke with patient's daughter, Debroah Baller, and provided her an updated regarding what the [place] could offer. She reported understanding and has decided that patient will have to return to [facility] as she is unable to arrange enough care for the patient at home. CSW again encouraged her to apply for facility Medicaid as Medicare may soon require out of pocket payments. CSW to continue to follow.    Expected Discharge Plan: Skilled Nursing Facility Barriers to Discharge: Continued Medical Work up  Expected Discharge Plan and Services Expected Discharge Plan: [place] In-house Referral: Clinical Social Work Discharge Planning Services: CM Consult Post Acute Care Choice: Durable Medical Equipment, Home Health Living arrangements for the past 2 months: Apartment                                       Social Determinants of Health (SDOH) Interventions    Readmission Risk Interventions Readmission Risk Prevention Plan 02/22/2020 01/18/2020  Transportation Screening Complete Complete  Medication Review Press photographer) Referral to Pharmacy Complete  PCP or Specialist appointment within 3-5 days of discharge Complete Complete  HRI or [place] Complete Complete  SW Recovery Care/Counseling Consult Complete Complete  Palliative Care Screening Not Applicable Complete  [place] Patient Refused Complete  Some recent data might be hidden

## 2020-03-07 NOTE — Progress Notes (Signed)
Pt refusing wound care, IV and medication. Advised of complications if no wound care performed and declination of medications but patient refused education. Doctor notified.

## 2020-03-07 NOTE — Progress Notes (Addendum)
Pt continues to refuse all medications and SCD's. When asked about his medications, pt responds, "not right now." No further time frame given. Education provided at this time. Pt refusing staff from changing sacral wound drsg as well. Pt in low bed, bed alarm on. Will continue to monitor.   0620 Staff into room to give pt a bath this morning before HD. Pt continues to refuse everything. Pt has broken the SCD machine in 2 pieces because "he did not want them on". Pt answers orientation questions appropriately but remains adamant about refusing any and all care from the staff. Pt's drsg on sacral wound has not been changed d/t to refusal as well.

## 2020-03-07 NOTE — Progress Notes (Signed)
Occupational Therapy Treatment Patient Details Name: [name]. MRN: [id number] DOB: [birth year] Today's Date: 03/07/2020    History of present illness Pt is a 68 y.o. male with d/c to SNF after prolonged complicated admission s/p C4-T2 laminectomy fusion (01/06/20), now admitted from [facility] 02/20/20 with confusion. Workup for metabolic encephalopathy, shock hypoxia; (+) COVID-19. 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 ETT 9/22-9/25. PMH includes HTN, HLD, ESRD (HD MWF), colon CA s/p resection.   OT comments  Pt continues to present with decreased cognition, balance, strength, and activity tolerance. Upon arrival, pt awake and supine in bed; urinary incontinence in bed. Pt continues to present with poor attention, memory, processing, problem solving, and awareness. Pt benefiting from increased time for following commands. Pt requiring Max A +2 for bed mobility and stand pivot to recliner. SpO2 100% on RA. Noting wound at peri area; notified RN. Pt would benefit from further acute OT to facilitate safe dc. Recommend dc to SNF for further OT to optimize safety, independence with ADLs, and return to PLOF.    Follow Up Recommendations  SNF;Supervision/Assistance - 24 hour    Equipment Recommendations  None recommended by OT    Recommendations for Other Services PT consult;Speech consult    Precautions / Restrictions Precautions Precautions: Fall;Other (comment) Precaution Comments: Rectal tube,        Mobility Bed Mobility Overal bed mobility: Needs Assistance Bed Mobility: Rolling;Sidelying to Sit Rolling: Mod assist Sidelying to sit: Max assist;+2 for physical assistance       General bed mobility comments: Max A +2 to power up into upright posture from sidelying. Pt with decreased sequencing and problem solving requiring Max cues.   Transfers Overall transfer level: Needs assistance Equipment used: 2 person hand held  assist Transfers: Sit to/from [facility] Sit to Stand: Min assist;+2 physical assistance Stand pivot transfers: Max assist;+2 physical assistance       General transfer comment: Min A +2 to power up into standing and gain balance. Max A +2 for maintaiing balance during pivot. Pt presenting with posterior lean, poor awareness, and decreased coordination.     Balance Overall balance assessment: Needs assistance Sitting-balance support: Feet supported Sitting balance-Leahy Scale: Fair Sitting balance - Comments: Sitting at EOB with close Min guard A   Standing balance support: Bilateral upper extremity supported;During functional activity Standing balance-Leahy Scale: Poor Standing balance comment: Requiring Min-Max A for standing balance                           ADL either performed or assessed with clinical judgement   ADL Overall ADL's : Needs assistance/impaired                     Lower Body Dressing: Total assistance Lower Body Dressing Details (indicate cue type and reason): Total A for adjusting socks Toilet Transfer: Maximal assistance;+2 for physical assistance Toilet Transfer Details (indicate cue type and reason): Pt performing sit<>stand with Min A +2 for power up and to gain balance. Once initating pivot to recliner, pt with decreased balance and coorindation, requiring Max A +2          Functional mobility during ADLs: Maximal assistance;+2 for physical assistance (Stand pivot only) General ADL Comments: Pt continues to require extensive assist for ADLs due to cognition and physical debility      Vision   Vision Assessment?: No apparent visual deficits   Perception  Praxis      Cognition Arousal/Alertness: Awake/alert;Lethargic Behavior During Therapy: Flat affect Overall Cognitive Status: No family/caregiver present to determine baseline cognitive functioning Area of Impairment: Attention;Memory;Following  commands;Safety/judgement;Awareness;Problem solving                 Orientation Level: Disoriented to;Place;Time;Situation Current Attention Level: Focused Memory: Decreased short-term memory Following Commands: Follows one step commands with increased time;Follows one step commands inconsistently Safety/Judgement: Decreased awareness of safety;Decreased awareness of deficits Awareness: Intellectual Problem Solving: Slow processing;Difficulty sequencing;Requires verbal cues;Requires tactile cues General Comments: Poor ST memory to recall cues provided 30-60 sec prior such as pt asking "what are we doing?" after cues to sit at EOB. Pt recalling that therapist said lunch would come soon at begining of session        Exercises     Shoulder Instructions       General Comments Pt not on monitoring upon arrival. SpO2 100% on RA.     Pertinent Vitals/ Pain       Pain Assessment: Faces Faces Pain Scale: Hurts a little bit Pain Location: "My tummy hurts" Pain Descriptors / Indicators: Grimacing;Guarding Pain Intervention(s): Monitored during session  Home Living                                          Prior Functioning/Environment              Frequency  Min 2X/week        Progress Toward Goals  OT Goals(current goals can now be found in the care plan section)  Progress towards OT goals: Progressing toward goals  Acute Rehab OT Goals Patient Stated Goal: "I want to go home" OT Goal Formulation: With patient Time For Goal Achievement: 03/21/20 Potential to Achieve Goals: Fair ADL Goals Pt Will Perform Eating: with set-up;with supervision;sitting;bed level Pt Will Perform Grooming: with set-up;with supervision;sitting;bed level Additional ADL Goal #1: Pt will perform bed mobility with Min A in preparation for ADLs Additional ADL Goal #2: Pt will follow one step commands during ADLS 93% of time with Min cues Additional ADL Goal #3: Pt will  maintain sitting balance at EOB with MIn A in preparation for ADLs  Plan Discharge plan remains appropriate    Co-evaluation    PT/OT/SLP Co-Evaluation/Treatment: Yes Reason for Co-Treatment: For patient/therapist safety;To address functional/ADL transfers   OT goals addressed during session: ADL's and self-care      AM-PAC OT "6 Clicks" Daily Activity     Outcome Measure   Help from another person eating meals?: A Lot Help from another person taking care of personal grooming?: A Lot Help from another person toileting, which includes using toliet, bedpan, or urinal?: Total Help from another person bathing (including washing, rinsing, drying)?: A Lot Help from another person to put on and taking off regular upper body clothing?: A Lot Help from another person to put on and taking off regular lower body clothing?: Total 6 Click Score: 10    End of Session Equipment Utilized During Treatment: Gait belt  OT Visit Diagnosis: Unsteadiness on feet (R26.81);Other abnormalities of gait and mobility (R26.89);Muscle weakness (generalized) (M62.81)   Activity Tolerance Patient limited by fatigue   Patient Left in chair;with call bell/phone within reach;with chair alarm set   Nurse Communication Mobility status        Time: 2355-7322 OT Time Calculation (min): 37 min  Charges: OT  General Charges $OT Visit: 1 Visit OT Treatments $Self Care/Home Management : 8-22 mins  Zohaib Heeney MSOT, OTR/L Acute Rehab Pager: [phone] Office: [place] 03/07/2020, 1:11 PM

## 2020-03-07 NOTE — Progress Notes (Signed)
[facility] Progress Note  Subjective:   Pt seen in room, in good spirits, interactive and Ox place and dialysis schedule  Vitals:   03/06/20 2109 03/07/20 0510 03/07/20 0746 03/07/20 1552  BP: (!) 166/61 (!) 158/65 (!) 154/73   Pulse: 91 87 87 91  Resp: 17 18 19 19   Temp: 98.6 F (37 C) 98.5 F (36.9 C) 98.5 F (36.9 C) 98.8 F (37.1 C)  TempSrc: Oral Oral Oral Oral  SpO2: 90% 100% 90% 100%  Weight:  97.1 kg    Height:        Exam:  alert, nad   no jvd  Chest cta bilat  Cor reg no RG  Abd soft ntnd no ascites   Ext no LE edema   Alert, NF, ox3  +TDC/ L AVF+bruit       CXR 9/23 - patchy perihilar/ basilar dz, no edema      OP HD: [facility] MWF   4h 66min  87kg  2/2 bath  TDC / L AVF  Hep none  -Mircera 150 mcg IV q 2 weeks (last dose 02/09/2020) -Venofer 100 mg IV X 8 doses (not started yet) -Calcitriol 1.5 mcg PO TIW  Assessment/ Plan: 1. COVID 19-recent diagnosis. Tested positive at [facility] 09/18 and again here.  Per primary team. 2. Acute hypoxic resp failure.  At one point required mechanical ventilation. Resolved.  3. AMS - much better now, responsive and interacting, resolved 4. Recent epidural abscess/MRSA cervical discitis-  S/p decompression surgery 01/06/20. S/p Vancomycin and Rifampin here through 10/01 per charting. s/p cefazolin. abx per primary team discretion.  5.  ESRD - MWF HD. 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 Next HD Wed 6.  HD access - L BC AVF placed Feb 2021. Was used starting in May and had infiltration in Aug prompting [facility] placement to rest the AVF. Using [facility] here and first attempted AVF on 10/1 and used it again today on 10/4. After next AVF use will see if we can get the tunneled catheter out 7.  Hypertension/volume - Previously on high dose midodrine here and this is now off.  Keep even on HD (previously on anti-hypertensives at home) 8. Anemia  - aranesp increased to 150 mcg for 9/29 onward for now. Hold OP Fe load.  s/p PRBC's 9/29 and  may need again soon  9.  Metabolic bone disease - off renvela due to hypophosphatemia and improved last check. Phos in AM. Anticipate need to resume at a lower dose per trends. on calcitriol - corrected Ca ok 10.  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 no heparin with HD (0.601 and upper limit of normal 0.4).  Currently on bivalirudin per primary team.  He had been refusing lab draws at one point.  Kelly Splinter, MD 03/07/2020, 4:16 PM       Recent Labs  Lab 02/29/20 1830 03/01/20 0257 03/02/20 1202 03/03/20 0929 03/04/20 1555 03/05/20 1043  K  --    < > 4.2   < > 3.9 4.0  BUN  --    < > 45*   < > 38* 49*  CREATININE  --    < > 6.04*   < > 5.89* 6.92*  CALCIUM  --    < > 7.9*   < > 7.9* 8.2*  PHOS 2.0*  --  2.5  --   --   --   HGB  --    < >  8.2*   < > 7.6* 8.1*   < > = values in this interval not displayed.   Inpatient medications: . [START ON 03/08/2020] calcitRIOL  1.5 mcg Oral Q M,W,F-HD  . chlorhexidine  15 mL Mouth Rinse BID  . Chlorhexidine Gluconate Cloth  6 each Topical Q0600  . collagenase   Topical Daily  . darbepoetin (ARANESP) injection - DIALYSIS  150 mcg Intravenous Q Wed-HD  . doxycycline  100 mg Oral Q12H  . feeding supplement (NEPRO CARB STEADY)  237 mL Oral BID BM  . insulin aspart  0-5 Units Subcutaneous QHS  . insulin aspart  0-9 Units Subcutaneous TID WC  . insulin detemir  5 Units Subcutaneous Daily  . mouth rinse  15 mL Mouth Rinse q12n4p  . multivitamin  1 tablet Oral QHS  . pantoprazole  40 mg Oral Daily  . QUEtiapine  25 mg Oral BID  . sodium chloride flush  3 mL Intravenous Q12H  . thiamine  100 mg Oral Daily  . zinc sulfate  220 mg Oral Daily   . sodium chloride Stopped (02/21/20 2227)  . sodium chloride     sodium chloride, sodium chloride, haloperidol lactate, lip balm, loperamide, metoprolol tartrate, ondansetron **OR** ondansetron (ZOFRAN) IV, Resource ThickenUp Clear, sodium chloride flush   Sol Blazing, MD 03/07/2020  4:16 PM

## 2020-03-07 NOTE — Progress Notes (Signed)
PROGRESS NOTE                                                                                                                                                                                                             Patient Demographics:    [name], is a 69 y.o. male, DOB - [birth year], AFB:[id number]  Outpatient Primary MD for the patient is [facility]   Admit date - 02/20/2020   LOS - 75  Chief Complaint  Patient presents with  . Covid Positive  . AMS       Brief Narrative: Patient is a 69 y.o. male with PMHx of ESRD on HD MWF, DM-2, MRSA bacteremia with epidural abscess s/p C4-T2 laminectomy/fusion on 01/06/2020-on IV vancomycin/rifampin through 10/1-resident of SNF-apparently tested positive for COVID-19 on 9/18-subsequently brought to the ED on 9/19 for worsening confusion (more than baseline)-fever-thought to have acute metabolic encephalopathy from COVID-19 infection and admitted to the hospitalist service.  Further hospital course complicated by development of hypotension/severe encephalopathy requiring intubation and ICU transfer.  See below for further details.  COVID-19 vaccinated status: Unvaccinated  Significant Events: 8/3-8/23>> hospitalized for epidural abscess/MRSA bacteremia-underwent laminectomy-briefly intubated due to altered mental status.  Discharged to SNF 9/18>> COVID-19 positive at SNF 9/19>> Admit to [facility] for fever/confusion 9/22>> obtunded/hypotensive after coming back from hemodialysis-emergently intubated/transferred to ICU 9/30>> transferred back to [facility] 9/30>> SQ heparin stopped-pharmacy consulted for HIT protocol.  Significant studies: 8/6>> Echo: EF> 75% 9/19>>Chest x-ray: No acute abnormality in the lung. 9/19>> CT head: No acute intracranial abnormality 9/19>> CT C-spine: Postoperative findings of posterior laminectomy/fusion C4-T1, prevertebral soft tissue edema  appears reduced, postoperative fluid collection overlies laminectomy site does not appear significantly changed compared to prior examination. 9/20>> MRI C-spine: Attempted but due to AMS-not completed 9/21>> MRI C Spine: Osteomyelitis of skull base, anterior C1 and odontoid, C2-C3 facet joint-associated erosion of bone since 8/27-resolved epidural abscess 9/22>> coarsened/heterogeneous appearance of the liver parenchyma-suspicious for cirrhosis, gallbladder not visualized. 9/22>> bilateral lower extremity Doppler: No DVT 9/29>> chest x-ray: Bibasilar infiltrates suggesting multifocal pneumonia  COVID-19 medications: Remdesivir: 9/19>> 9/21  Antibiotics: IV vancomycin plan to continue through October 1 (rifampin stopped 9/21 due to elevated LFTs) Ancef: 9/26>> Zosyn: 9/24>> 9/26  Microbiology data: 9/20 >>blood culture: No growth so far 9/21>> blood culture: No growth so far 9/24>> sputum  culture: E. coli/MSSA  Procedures: ET tube 9/22 > 9/25  Consults: Nephrology PCCM GI  DVT prophylaxis: heparin injection 5,000 Units Start: 02/20/20>> stopped on 9/30 due to worsening thrombocytopenia. SCDs Start: 02/20/20 1628   Subjective:   Nursing reports reviewed.  Patient has been refusing blood draws medications as well as placement of IV access.  He does not give a reason for the same.  He is noted to be distracted.  Not fully oriented.  Denies any other complaints.  No shortness of breath or chest pain.   Assessment  & Plan :   Acute hypoxic respiratory failure secondary to HCAP (E. coli/MSSA in sputum culture) and COVID-19 pneumonia Initially admitted and thought to have mild disease. Started on steroid/Remdesivir. But decompensated on 9/22 requiring intubation and ICU transfer.  Extubated 9/26.  Found to have MSSA in the sputum culture.  Given intravenous cefazolin till 10/1. Remdesivir stopped on 9/22 due to significantly elevated LFTs.    Respiratory status remains stable.  He  is saturating normal on room air.  Tested positive on 9/19.  He can end isolation on 10/10.  Septic shock, resolved Required pressors while in the ICU. Thought to be most likely secondary to HCAP although patient has a history of recent MRSA bacteremia/cervical spine osteomyelitis/abscess.   Acute metabolic encephalopathy Multifactorial etiology-on initial presentation to the hospital was felt to be due to uremia (due to under dialysis, patient got short outpatient HD numerous times), sepsis physiology from COVID-19. However his mental status markedly deteriorated on 9/22 likely due to worsening sepsis physiology from HCAP.  Post extubation has had issues with delirium/likely related to intubation/ICU stay.  Required Precedex while in the ICU.  Patient mental status has been stable for the last several days.  However he has been refusing medical interventions including blood draws, placement of IV access and medications.  Does not give a reason for the same.  Psychiatry consult is pending.  Remains on Seroquel.  Dose was increased yesterday.   He apparently has short-term memory issues. There was some thought as to whether patient has underlying dementia. Has a history of alcohol/cocaine use in the past.  Add thiamine.  Dysphagia He was getting tube feedings however the tube came out on 10/3.  Encourage oral intake.  Avoid replacing tube as much as possible.  Speech therapy is following as well.     Elevated D-dimer Secondary to COVID-19.  He did not have any significant hypoxia.  Lower extremity Dopplers were negative.  Since he has clinically improved will not pursue any further work-up.  D-dimer was 6.16 on October 1.  We will recheck it on wednesday with hemodialysis.  History of MRSA bacteremia with epidural abscess requiring C4-T2 laminectomy:  MRI C-spine completed on 9/21. Appears to have residual osteomyelitis of the skull base area. Was discussed with neurosurgeon Dr. Kathyrn Sheriff on 9/21.  No further surgical intervention required.  Plans were to continue vancomycin/rifampin through October 1-however rifampin discontinued due to significantly elevated LFTs.  Patient now off of vancomycin.  Started on doxycycline from 10/1.  Patient will need follow-up with infectious disease clinic post discharge.  Thrombocytopenia Suspect this is multifactorial in etiology from acute illness/sepsis/thrombocytopenia-potentially some contribution from underlying liver cirrhosis-however significant drop from what he first presented with-hence HIT was in the differential Subcutaneous heparin was discontinued.  HIT panel was ordered..  Patient started on bivalirudin in the interim.   Patient has been refusing blood draws.  IV access was lost.  He refused another  IV access.  We will discontinue bivalirudin for now since patient is not being cooperative which increases the risk of side effects. The heparin-induced platelet antibody is 0.60.  Slightly above the normal range.  Serotonin release assay is pending. Patient's 4 T score was 6.  The heparin-induced antibody is 0.6 as discussed above.  Wait for functional assay to determine if this was HIT or not.  Platelet counts noted to be 94,000 on 10/3.  We will recheck it tomorrow..  Normocytic Anemia Secondary to ESRD. Worsened by acute illness-s/p 1 unit of PRBC on 9/29.  Hemoglobin seems to be running between 7 and 8.  No overt bleeding noted.  Continue to monitor.   Shock liver LFTs have normalized.    History of HCV/liver cirrhosis HCV antibody positive in the past.  Viral load was done and no hepatitis C quantitative was detected.   Liver ultrasound with suggestion of cirrhotic appearance.  Will need outpatient GI follow-up.  DM-2 (A1c 6.5 on 8/11):  Appears to have brittle diabetes resulting in hypoglycemic episodes while in the ICU.  Avoid tight glycemic control.  Dose of Levemir was decreased.  CBGs noted to be stable.  Continue to  monitor.  ESRD on HD MWF Nephrology following.  Patient being dialyzed as per his usual schedule.  Sacral wound/loose stools Wound care is following for sacral wounds.  Loose stools most likely due to tube feedings.  Abdomen is benign.  WBC is normal.   Currently has a Flexi-Seal.  Mainly present to protect the wounds that he has in the lower back area.  Continue Imodium.  Hopefully we can discontinue the Flexi-Seal soon.    Metabolic acidosis Likely secondary to incomplete HD treatments. Resolved with regular HD sessions.  Hyperkalemia Likely secondary to incomplete dialysis treatments, resolved with hemodialysis  Essential hypertension  Tolerate higher blood pressure in this patient who undergoes dialysis.  Continue to monitor.    Hyperlipidemia Statin on hold-should be able to resume when closer to discharge.  Questionable dementia Discussed with patient's daughter.  She is not certain if patient truly has cognitive impairment or dementia.  Psychiatry consulted.  Deconditioning/debility: Secondary to acute illness on top of chronic debility.  PT eval completed-SNF on discharge.  GI prophylaxis: PPI  Condition - Stable  Family Communication  :  Daughter Charlis Harner [phone])  Code Status :  Full Code  Diet :  Diet Order            DIET DYS 2 Room service appropriate? No; Fluid consistency: Nectar Thick  Diet effective now                  Disposition Plan  : Plan is for the patient to go to skilled nursing facility.  Status is: Inpatient  Remains inpatient appropriate because:Inpatient level of care appropriate due to severity of illness  Dispo: The patient is from: SNF              Anticipated d/c is to: SNF              Anticipated d/c date is: Hopefully by the end of this week              Patient currently is not medically stable to d/c.    Antimicorbials  :    Anti-infectives (From admission, onward)   Start     Dose/Rate Route Frequency  Ordered Stop   03/06/20 2000  doxycycline (VIBRA-TABS) tablet 100 mg  100 mg Oral Every 12 hours 03/06/20 1632     03/04/20 1000  doxycycline (VIBRA-TABS) tablet 100 mg  Status:  Discontinued        100 mg Oral Every 12 hours 03/01/20 0944 03/04/20 0741   03/04/20 1000  doxycycline (VIBRA-TABS) tablet 100 mg  Status:  Discontinued        100 mg Per Tube Every 12 hours 03/04/20 0741 03/06/20 1632   02/27/20 1500  ceFAZolin (ANCEF) IVPB 1 g/50 mL premix        1 g 100 mL/hr over 30 Minutes Intravenous Every 24 hours 02/27/20 1433 03/02/20 1445   02/25/20 1000  piperacillin-tazobactam (ZOSYN) IVPB 2.25 g  Status:  Discontinued        2.25 g 100 mL/hr over 30 Minutes Intravenous Every 8 hours 02/25/20 0905 02/27/20 1433   02/25/20 0830  piperacillin-tazo (ZOSYN) NICU IV syringe 225 mg/mL  Status:  Discontinued        100 mg/kg of piperacillin  84.3 kg 84.4 mL/hr over 30 Minutes Intravenous Every 8 hours 02/25/20 0825 02/25/20 0905   02/25/20 0814  vancomycin (VANCOCIN) 1-5 GM/200ML-% IVPB       Note to Pharmacy: Cherylann Banas   : cabinet override      02/25/20 0814 02/25/20 1158   02/23/20 0642  vancomycin (VANCOCIN) 1-5 GM/200ML-% IVPB       Note to Pharmacy: [place], [place] : cabinet override      02/23/20 0642 02/23/20 1859   02/21/20 1200  vancomycin (VANCOCIN) IVPB 1000 mg/200 mL premix        1,000 mg 200 mL/hr over 60 Minutes Intravenous Every M-W-F (Hemodialysis) 02/20/20 1959 03/03/20 1130   02/21/20 1000  remdesivir 100 mg in sodium chloride 0.9 % 100 mL IVPB  Status:  Discontinued       "Followed by" Linked Group Details   100 mg 200 mL/hr over 30 Minutes Intravenous Daily 02/20/20 1632 02/23/20 0633   02/20/20 2000  rifampin (RIFADIN) capsule 300 mg  Status:  Discontinued        300 mg Oral Every 12 hours 02/20/20 1907 02/23/20 0633   02/20/20 1730  remdesivir 200 mg in sodium chloride 0.9% 250 mL IVPB       "Followed by" Linked Group Details   200 mg 580 mL/hr  over 30 Minutes Intravenous Once 02/20/20 1632 02/20/20 1913      Inpatient Medications  Scheduled Meds: . [START ON 03/08/2020] calcitRIOL  1.5 mcg Oral Q M,W,F-HD  . chlorhexidine  15 mL Mouth Rinse BID  . Chlorhexidine Gluconate Cloth  6 each Topical Q0600  . collagenase   Topical Daily  . darbepoetin (ARANESP) injection - DIALYSIS  150 mcg Intravenous Q Wed-HD  . doxycycline  100 mg Oral Q12H  . feeding supplement (NEPRO CARB STEADY)  237 mL Oral BID BM  . insulin aspart  0-5 Units Subcutaneous QHS  . insulin aspart  0-9 Units Subcutaneous TID WC  . insulin detemir  5 Units Subcutaneous Daily  . mouth rinse  15 mL Mouth Rinse q12n4p  . multivitamin  1 tablet Oral QHS  . pantoprazole  40 mg Oral Daily  . QUEtiapine  25 mg Oral QHS  . sodium chloride flush  3 mL Intravenous Q12H  . zinc sulfate  220 mg Oral Daily   Continuous Infusions: . sodium chloride Stopped (02/21/20 2227)  . sodium chloride    . bivalirudin (ANGIOMAX) infusion 0.5 mg/mL (Non-ACS indications) Stopped (03/06/20 1645)   PRN  Meds:.sodium chloride, sodium chloride, haloperidol lactate, lip balm, loperamide, metoprolol tartrate, ondansetron **OR** ondansetron (ZOFRAN) IV, Resource ThickenUp Clear, sodium chloride flush    Bonnielee Haff M.D on 03/07/2020 at 11:31 AM  To page go to [email or web address] - use universal password  [facility] -  Office  [phone]    Objective:   Vitals:   03/06/20 1200 03/06/20 2109 03/07/20 0510 03/07/20 0746  BP: (!) 156/73 (!) 166/61 (!) 158/65 (!) 154/73  Pulse: 99 91 87 87  Resp:  17 18 19   Temp:  98.6 F (37 C) 98.5 F (36.9 C) 98.5 F (36.9 C)  TempSrc: Axillary Oral Oral Oral  SpO2:  90% 100% 90%  Weight: 94.1 kg  97.1 kg   Height:        Wt Readings from Last 3 Encounters:  03/07/20 97.1 kg  01/28/20 86.2 kg  01/24/20 89.4 kg     Intake/Output Summary (Last 24 hours) at 03/07/2020 1131 Last data filed at 03/07/2020 0518 Gross per 24 hour   Intake 890 ml  Output 1500 ml  Net -610 ml     Physical Exam  General appearance: Awake alert.  In no distress.  distracted. Resp: Clear to auscultation bilaterally.  Normal effort Cardio: S1-S2 is normal regular.  No S3-S4.  No rubs murmurs or bruit GI: Abdomen is soft.  Nontender nondistended.  Bowel sounds are present normal.  No masses organomegaly Extremities: No edema. Neurologic: Oriented to city, state.  Knew the month.  Not sure about the year.  No focal neurological deficits.        Data Review:    CBC Recent Labs  Lab 03/01/20 0742 03/01/20 0742 03/01/20 1750 03/02/20 1202 03/03/20 0929 03/04/20 1555 03/05/20 1043  WBC 8.4  --   --  8.9 8.8 9.5 8.3  HGB 6.8*   < > 9.1* 8.2* 7.6* 7.6* 8.1*  HCT 21.2*   < > 29.3* 26.2* 24.2* 24.3* 25.7*  PLT 74*  --   --  62* 62* 90* 94*  MCV 85.1  --   --  86.8 86.1 85.6 85.1  MCH 27.3  --   --  27.2 27.0 26.8 26.8  MCHC 32.1  --   --  31.3 31.4 31.3 31.5  RDW 23.3*  --   --  23.3* 22.8* 22.9* 22.8*   < > = values in this interval not displayed.    Chemistries  Recent Labs  Lab 02/29/20 1830 03/01/20 0257 03/01/20 0742 03/02/20 1202 03/03/20 0929 03/04/20 1555 03/05/20 1043  NA  --    < > 139 139 136 134* 138  K  --    < > 4.2 4.2 4.2 3.9 4.0  CL  --   --  106 103 103 99 101  CO2  --   --  24 26 23 25 23   GLUCOSE  --   --  234* 224* 184* 166* 134*  BUN  --   --  64* 45* 57* 38* 49*  CREATININE  --   --  7.70* 6.04* 7.54* 5.89* 6.92*  CALCIUM  --   --  7.6* 7.9* 7.9* 7.9* 8.2*  MG 2.4  --  2.4  --   --   --   --   AST  --   --  48* 35 28 32 30  ALT  --   --  52* 20 11 8 9   ALKPHOS  --   --  127* 108 92 98 93  BILITOT  --   --  0.9 0.8 1.1 1.0 1.3*   < > = values in this interval not displayed.    Radiology Reports CT Head Wo Contrast  Result Date: 02/20/2020 CLINICAL DATA:  Delirium.  COVID-19 positive.  Recent neck surgery. EXAM: CT HEAD WITHOUT CONTRAST TECHNIQUE: Contiguous axial images were  obtained from the base of the skull through the vertex without intravenous contrast. COMPARISON:  January 28, 2020 FINDINGS: Brain: No subdural, epidural, or subarachnoid hemorrhage. A lacunar infarct in the right thalamus is stable and nonacute. Moderate white matter changes again identified. No acute cortical ischemia or acute infarct noted. No mass effect or midline shift. Ventricles and sulci are unchanged. Cerebellum, brainstem, and basal cisterns are normal. Vascular: Calcified atherosclerosis in the intracranial carotids. Skull: Normal. Negative for fracture or focal lesion. Sinuses/Orbits: Mild scattered mucosal thickening in the ethmoid air cells and left maxillary sinus. No air-fluid levels. Mastoid air cells and middle ears are well aerated. Other: None. IMPRESSION: 1. No acute intracranial abnormalities. Chronic white matter changes. Chronic right thalamic lacunar infarct. 2. Mild sinus disease as above. Electronically Signed   By: Dorise Bullion III M.D   On: 02/20/2020 14:49   CT Cervical Spine Wo Contrast  Result Date: 02/20/2020 CLINICAL DATA:  COVID positive, recent neck surgery EXAM: CT CERVICAL SPINE WITHOUT CONTRAST TECHNIQUE: Multidetector CT imaging of the cervical spine was performed without intravenous contrast. Multiplanar CT image reconstructions were also generated. COMPARISON:  CT cervical spine, 01/28/2020 FINDINGS: Alignment: Normal. Skull base and vertebrae: No acute fracture. No primary bone lesion. Erosive change of the dens and adjacent transverse process of C1 are not significantly changed compared to prior examination. Soft tissues and spinal canal: Assessment of the soft tissues is somewhat limited by metallic streak artifact and lack of intravenous contrast; within this limitation, no unexpected fluid collection overlying the laminectomy site. Prevertebral soft tissue edema appears reduced compared to prior examination. No visible canal hematoma. Disc levels: Status post  posterior laminectomy and fusion of C4 through T1, with moderate disc space height loss and osteophytosis of C6-C7 and C7-T1. Upper chest: Negative. Other: None. IMPRESSION: 1. Redemonstrated postoperative findings status post posterior laminectomy and fusion of C4 through T1, with moderate disc space height loss and osteophytosis of C6-C7 and C7-T1. 2. Prevertebral soft tissue edema appears reduced compared to prior examination. 3. Postoperative fluid collection overlying laminectomy site does not appear significantly changed compared to prior examination. 4. Assessment of the soft tissues and epidural space is generally limited on noncontrast CT and additionally limited by metallic streak artifact; contrast enhanced MRI is the test of choice for evaluation of edema, fluid collections, the epidural space, and discitis osteomyelitis if clinically suspected in the postoperative setting. Electronically Signed   By: Eddie Candle M.D.   On: 02/20/2020 14:53   MR CERVICAL SPINE WO CONTRAST  Result Date: 02/22/2020 CLINICAL DATA:  69 year old male with history of dorsal cervical epidural abscess, status post operative drainage and decompression last month on 01/06/2020. Follow-up MRI 01/29/2020 with new left occipital condyle, anterior C1 and odontoid marrow edema and erosion compatible with skull base osteomyelitis. End stage renal disease on dialysis. Confusion. Positive COVID-19. EXAM: MRI CERVICAL SPINE WITHOUT CONTRAST TECHNIQUE: Multiplanar, multisequence MR imaging of the cervical spine was performed. No intravenous contrast was administered. COMPARISON:  Recent CT head and cervical spine 02/20/2020. Postoperative MRI 01/29/2020. Preoperative MRI FINDINGS: The examination had to be discontinued just prior to completion due to confusion, agitation. Only axial GRE imaging was not obtained. Alignment: Straightening  and mild reversal of cervical lordosis remains improved since 01/04/2020. Vertebrae: Abnormal marrow  edema and/or T2 and STIR hyperintense soft tissue surrounding the odontoid (series 3, image 7) which has been partially eroded along with the adjacent clivus and anterior C1 ring as seen on the recent CT. Mild marrow edema in both occipital condyles, greater on the left. Faint marrow edema also in the C2-C3 facets, mostly on the right (series 3, image 2). Overall the appearance has not progressed since 01/22/2020. Sequelae of posterior decompression and fusion from C3 through T1. Mild hardware susceptibility artifact. No other cervical or upper thoracic marrow edema or acute osseous abnormality. Cord: Suboptimal cord detail due to motion and hardware susceptibility. No definite cervical or upper thoracic spinal cord signal abnormality. Normalized epidural space since August. Posterior Fossa, vertebral arteries, paraspinal tissues: Major vascular flow voids are preserved in the neck. Postoperative changes to the posterior neck soft tissues with no adverse features. Negative visible lung apices. Cervicomedullary junction and visible posterior fossa are stable and within normal limits. Disc levels: C2-C3: Improved thecal sac patency since 01/04/2020. Foraminal endplate spurring and moderate bilateral facet degeneration. Mild residual spinal stenosis. Mild bilateral C3 foraminal stenosis. C3-C4: Chronic central disc protrusion (series 2, image 7) with mild facet and ligament flavum hypertrophy. Improved thecal sac patency since 01/04/2020 but residual mild spinal stenosis and mild cord mass effect. Moderate bilateral C4 foraminal stenosis. C4-C5 through T1-T2: Posterior decompression with no significant spinal stenosis despite residual disc and endplate degeneration at some levels. There is mild to moderate bilateral C8 foraminal stenosis related to disc, endplate and residual facet hypertrophy at C7-T1. IMPRESSION: 1. 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 Possible involvement of the right C2-C3  facet joint also. Associated erosion of bone since 01/28/2020 as demonstrated by CT two days ago. But on MRI no other significant progression is identified since August; no new levels of involvement. 2. Resolved posterior epidural abscess following surgery last month. Satisfactorily decompressed levels C4-C5 through T1-T2. 3. Improved thecal sac patency but mild residual degenerative spinal stenosis at both C2-C3 and C3-C4. No definite spinal cord signal abnormality. Electronically Signed   By: Genevie Ann M.D.   On: 02/22/2020 11:48   DG CHEST PORT 1 VIEW  Result Date: 03/01/2020 CLINICAL DATA:  Respiratory distress.  COVID. EXAM: PORTABLE CHEST 1 VIEW COMPARISON:  02/24/2020 FINDINGS: The endotracheal tube is been removed in the enteric tube converted for a feeding tube. Enteric tube tip is off the field of view but below the left hemidiaphragm. Right central venous catheter is unchanged in position. No pneumothorax. Cardiac enlargement. Bilateral basilar infiltrates suggesting multifocal pneumonia. IMPRESSION: Bilateral basilar infiltrates suggesting multifocal pneumonia. Electronically Signed   By: Lucienne Capers M.D.   On: 03/01/2020 05:02   DG CHEST PORT 1 VIEW  Result Date: 02/24/2020 CLINICAL DATA:  COVID-19 positive 02/20/2020, respiratory failure, intubated EXAM: PORTABLE CHEST 1 VIEW COMPARISON:  02/23/2020 FINDINGS: Single frontal view of the chest demonstrates endotracheal tube well above carina. Enteric catheter passes below diaphragm tip excluded by collimation. Bilateral internal jugular catheters are unchanged. Cardiac silhouette is stable. Persistent patchy consolidation at the lung bases, unchanged. No effusion or pneumothorax. No acute bony abnormalities. IMPRESSION: 1. Stable support devices. 2. Persistent bibasilar patchy consolidation which could reflect atelectasis or pneumonia. Electronically Signed   By: Randa Ngo M.D.   On: 02/24/2020 18:07   DG CHEST PORT 1 VIEW  Result  Date: 02/23/2020 CLINICAL DATA:  Encounter for central line  placement EXAM: PORTABLE CHEST 1 VIEW COMPARISON:  02/21/2020 FINDINGS: Endotracheal tube approximately 2 cm above the carina. NG tube enters the stomach with the tip not visualized. Right jugular central venous catheter tip in the right atrium unchanged. New left jugular catheter with the tip in the proximal SVC. No pneumothorax. Mild right lower lobe atelectasis. Negative for edema or effusion IMPRESSION: Left jugular central venous catheter tip in the SVC. No pneumothorax. Mild right lower lobe atelectasis. Endotracheal tube 2 cm above the carina. Electronically Signed   By: Franchot Gallo M.D.   On: 02/23/2020 15:14   DG Chest Port 1 View  Result Date: 02/20/2020 CLINICAL DATA:  COVID EXAM: PORTABLE CHEST 1 VIEW COMPARISON:  01/12/2020 FINDINGS: Interval extubation. Interval placement of large-bore right neck vascular catheter, tip position near the superior cavoatrial junction. The heart size and mediastinal contours are within normal limits. Both lungs are clear. The visualized skeletal structures are unremarkable. IMPRESSION: 1. Interval placement of large-bore right neck vascular catheter, tip position near the superior cavoatrial junction. 2. No acute abnormality of the lungs in AP portable projection. Electronically Signed   By: Eddie Candle M.D.   On: 02/20/2020 13:43   DG Chest Port 1V same Day  Result Date: 03/02/2020 CLINICAL DATA:  Shortness of breath.  Altered mental status. EXAM: PORTABLE CHEST 1 VIEW COMPARISON:  Radiograph yesterday. FINDINGS: Enteric tube remains in place with tip below the diaphragm. Right internal jugular dialysis catheter unchanged. Improving cardiomegaly with stable mediastinal contours. Persistent but improving patchy bibasilar opacities. No pneumothorax. No large pleural effusion. Surgical hardware in the lower cervical spine is partially included. IMPRESSION: 1. Persistent but improving patchy bibasilar  opacities, atelectasis versus pneumonia. 2. Improving cardiomegaly. Electronically Signed   By: Keith Rake M.D.   On: 03/02/2020 15:17   DG Chest Port 1V same Day  Result Date: 02/21/2020 CLINICAL DATA:  Shortness of breath, COVID EXAM: PORTABLE CHEST 1 VIEW COMPARISON:  02/20/2020 FINDINGS: Right dialysis catheter remains in place, unchanged. Heart is normal size. No confluent airspace opacities or effusions. No acute bony abnormality. IMPRESSION: No acute cardiopulmonary disease. Electronically Signed   By: Rolm Baptise M.D.   On: 02/21/2020 13:03   DG Swallowing Func-Speech Pathology  Result Date: 03/03/2020 Objective Swallowing Evaluation: Type of Study: MBS-Modified Barium Swallow Study  Patient Details Name: Nycere Presley. MRN: [id number] Date of Birth: Jan 11, 1951 Today's Date: 03/03/2020 Time: SLP Start Time (ACUTE ONLY): 7619 -SLP Stop Time (ACUTE ONLY): 1418 SLP Time Calculation (min) (ACUTE ONLY): 24 min Past Medical History: Past Medical History: Diagnosis Date . Anemia  . Arthritis   left knee . Chronic kidney disease   acute renal failure/injury requiring short-term HD 2013 . Colon cancer ([place])  . Colon polyp   11/2011 - Polyps identified, biopsy - invasive adenocarinoma . DM II (diabetes mellitus, type II), controlled ([place])   type 2 IDDM x 15 years. A1C 1/09 13.7.  Marland Kitchen 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 12/25/2011 . GERD (gastroesophageal reflux disease)  . Heart murmur  . Hx of ileostomy 10/13/2012 . Hyperlipidemia  . Hypertension   for a few years and resolved in 2013/2014. . Sleep apnea   does not wear CPAP now . Wears glasses  Past Surgical History: Past Surgical History: Procedure Laterality Date . APPLICATION OF WOUND VAC  10/09/3265  Procedure: APPLICATION OF WOUND VAC;  Surgeon: Zenovia Jarred, MD;  Location: [place];  Service: General;  Laterality: N/A; . APPLICATION OF WOUND VAC  2/42/3536  Procedure: APPLICATION OF WOUND VAC;  Surgeon: Imogene Burn. [name], MD;   Location: [place];  Service: General;  Laterality: N/A; . AV FISTULA PLACEMENT Left 07/29/2019  Procedure: ARTERIOVENOUS (AV) FISTULA CREATION;  Surgeon: Marty Heck, MD;  Location: [place];  Service: Vascular;  Laterality: Left; . COLONOSCOPY   . COLONOSCOPY  02/06/2012  Procedure: COLONOSCOPY;  Surgeon: Milus Banister, MD;  Location: [place];  Service: Endoscopy;; . COLONOSCOPY WITH PROPOFOL N/A 06/10/2019  Procedure: COLONOSCOPY WITH PROPOFOL;  Surgeon: Milus Banister, MD;  Location: [facility];  Service: Endoscopy;  Laterality: N/A; . COLOSTOMY REVERSAL   . DRESSING CHANGE UNDER ANESTHESIA  02/18/2012  Procedure: DRESSING CHANGE UNDER ANESTHESIA;  Surgeon: Odis Hollingshead, MD;  Location: [place];  Service: General;  Laterality: N/A; . ESOPHAGOGASTRODUODENOSCOPY (EGD) WITH PROPOFOL N/A 06/10/2019  Procedure: ESOPHAGOGASTRODUODENOSCOPY (EGD) WITH PROPOFOL;  Surgeon: Milus Banister, MD;  Location: [facility];  Service: Endoscopy;  Laterality: N/A; . GANGLION CYST EXCISION  20 YRS AGO  RT ARM . ILEOSTOMY  02/16/2012  Procedure: ILEOSTOMY;  Surgeon: Zenovia Jarred, MD;  Location: [place];  Service: General;  Laterality: N/A; . ILEOSTOMY CLOSURE N/A 09/02/2012  Procedure: ILEOSTOMY REVERSAL;  Surgeon: Imogene Burn. [name], MD;  Location: [place];  Service: General;  Laterality: N/A; . ILIOSTOMY   . INCISION AND DRAINAGE OF WOUND  02/23/2012  Procedure: IRRIGATION AND DEBRIDEMENT WOUND;  Surgeon: Joyice Faster. Cornett, MD;  Location: [place];  Service: General;  Laterality: N/A; . IR DIALY SHUNT INTRO [place] W/IMG LEFT Left 01/21/2020 . IR FLUORO GUIDE CV LINE RIGHT  01/21/2020 . IR REMOVAL TUN CV CATH W/O FL  01/07/2020 . IR US GUIDE VASC ACCESS LEFT  01/21/2020 . IR US GUIDE VASC ACCESS RIGHT  01/21/2020 . LAPAROTOMY  02/16/2012  Procedure: EXPLORATORY LAPAROTOMY;  Surgeon: Zenovia Jarred, MD;  Location: [place];  Service: General;  Laterality: N/A;  Exploratory Laparotomy with resection of anastomosis . LAPAROTOMY   02/18/2012  Procedure: EXPLORATORY LAPAROTOMY;  Surgeon: Odis Hollingshead, MD;  Location: [place];  Service: General;  Laterality: N/A;  exploratory laparotomy  change of abdominal vac dressing . LAPAROTOMY  02/20/2012  Procedure: EXPLORATORY LAPAROTOMY;  Surgeon: Odis Hollingshead, MD;  Location: [place];  Service: General;  Laterality: N/A; . LAPAROTOMY  02/23/2012  Procedure: EXPLORATORY LAPAROTOMY;  Surgeon: Joyice Faster. Cornett, MD;  Location: [place];  Service: General;  Laterality: N/A;  Irrigation and Debridement of abdominal wound with wound vac change with partial closure . LAPAROTOMY  02/26/2012  Procedure: EXPLORATORY LAPAROTOMY;  Surgeon: Imogene Burn. [name], MD;  Location: [place];  Service: General;  Laterality: N/A;   . PARTIAL COLECTOMY  02/06/2012 . PARTIAL COLECTOMY  02/06/2012  Procedure: PARTIAL COLECTOMY;  Surgeon: Imogene Burn. [name], MD;  Location: [place];  Service: General;  Laterality: N/A;  right partial colectomy . POSTERIOR CERVICAL FUSION/FORAMINOTOMY N/A 01/06/2020  Procedure: CERVICAL FOUR- THORACIC TWO LAMINECTOMY AND FUSION;  Surgeon: Consuella Lose, MD;  Location: [place];  Service: Neurosurgery;  Laterality: N/A;  CERVICAL FOUR- THORACIC TWO LAMINECTOMY AND FUSION . RESECTION SMALL BOWEL / CLOSURE ILEOSTOMY  402/2014  Dr [name] . VACUUM ASSISTED CLOSURE CHANGE  02/20/2012  Procedure: ABDOMINAL VACUUM ASSISTED CLOSURE CHANGE;  Surgeon: Odis Hollingshead, MD;  Location: [place];  Service: General;; . VACUUM ASSISTED CLOSURE CHANGE  02/23/2012  Procedure: ABDOMINAL VACUUM ASSISTED CLOSURE CHANGE;  Surgeon: Joyice Faster. Cornett, MD;  Location: [place];  Service: General;  Laterality:  N/A; HPI: [name]. is a 69 y.o. male with medical history significant of GERD, hypertension, hyperlipidemia ESRD on hemodialysis, type 2 diabetes mellitus, anemia of chronic disease, colon cancer status post resection, epidural abscess status post laminectomy, posterior cervical fusion on 01/06/2020 and MRSA bacteremia and  presented emergently for evaluation of confusion and COVID-19 positive. Initial BSE 02/21/20 was limited- needed enouragement, audible swallow, eructation, poor dentition and reduced endurance, Work of breathing was stable. Dys 2/thin recommended. On 9/22 became unresponsive after HD, transferred to ICU and intubated then extubated 9/25. Currently has NGT, encephalopathy improving per MD note and speech to re-eval. CXR Bilateral basilar infiltrates suggesting multifocal pneumonia.  No data recorded Assessment / Plan / Recommendation CHL IP CLINICAL IMPRESSIONS 03/03/2020 Clinical Impression Pt demonstrates mild orpharyngeal dysphagia mostly related to anatomical differences. 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 In addition, initiation of swallow is intermittently late with boluses reaching the pyriform sinuses for up to 5 seconds prior to swallow. His epiglottis at times does not fully invert adding to inadequate protection. Thin barium was penetrated before and during the swallow remaining superior to the vocal cords. His reflexive throat clear, present most of the time did not clear vestibule and a chin tuck was not effective. Penetration was present once with nectar at end of study. Nectar thick recommended for pt to initiate diet and upgrade to thin from clinical observation with SLP. Dys 2 texture recommended, crush pills, small sips/bites, intermittent throat clear and avoid straws.    SLP Visit Diagnosis Dysphagia, pharyngeal phase (R13.13) Attention and concentration deficit following -- Frontal lobe and executive function deficit following -- Impact on safety and function Moderate aspiration risk   CHL IP TREATMENT RECOMMENDATION 03/03/2020 Treatment Recommendations Therapy as outlined in treatment plan below   Prognosis 03/03/2020 Prognosis for Safe Diet Advancement Good Barriers to Reach Goals --  Barriers/Prognosis Comment -- CHL IP DIET RECOMMENDATION 03/03/2020 SLP Diet Recommendations Dysphagia 2 (Fine chop) solids;Nectar thick liquid Liquid Administration via Cup Medication Administration Crushed with puree Compensations Slow rate;Small sips/bites;Clear throat intermittently Postural Changes Seated upright at 90 degrees   CHL IP OTHER RECOMMENDATIONS 03/03/2020 Recommended Consults -- Oral Care Recommendations Oral care BID Other Recommendations Order thickener from pharmacy   CHL IP FOLLOW UP RECOMMENDATIONS 03/03/2020 Follow up Recommendations (No Data)   CHL IP FREQUENCY AND DURATION 03/03/2020 Speech Therapy Frequency (ACUTE ONLY) min 2x/week Treatment Duration 2 weeks      CHL IP ORAL PHASE 03/03/2020 Oral Phase Impaired Oral - Pudding Teaspoon -- Oral - Pudding Cup -- Oral - Honey Teaspoon -- Oral - Honey Cup -- Oral - Nectar Teaspoon -- Oral - Nectar Cup WFL Oral - Nectar Straw -- Oral - Thin Teaspoon -- Oral - Thin Cup Decreased bolus cohesion Oral - Thin Straw -- Oral - Puree -- Oral - Mech Soft -- Oral - Regular Delayed oral transit Oral - Multi-Consistency -- Oral - Pill -- Oral Phase - Comment --  CHL IP PHARYNGEAL PHASE 03/03/2020 Pharyngeal Phase Impaired Pharyngeal- Pudding Teaspoon -- Pharyngeal -- Pharyngeal- Pudding Cup -- Pharyngeal -- Pharyngeal- Honey Teaspoon -- Pharyngeal -- Pharyngeal- Honey Cup -- Pharyngeal -- Pharyngeal- Nectar Teaspoon -- Pharyngeal -- Pharyngeal- Nectar Cup Delayed swallow initiation-pyriform sinuses;Reduced epiglottic inversion;Reduced airway/laryngeal closure;Penetration/Aspiration during swallow;Pharyngeal residue - valleculae Pharyngeal Material enters airway, remains ABOVE vocal cords and not ejected out Pharyngeal- Nectar Straw -- Pharyngeal -- Pharyngeal- Thin Teaspoon --  Pharyngeal -- Pharyngeal- Thin Cup Delayed swallow initiation-pyriform sinuses;Pharyngeal residue - valleculae;Pharyngeal residue - pyriform;Penetration/Aspiration before swallow;Reduced  airway/laryngeal closure Pharyngeal Material enters airway, remains ABOVE vocal cords and not ejected out Pharyngeal- Thin Straw -- Pharyngeal -- Pharyngeal- Puree -- Pharyngeal -- Pharyngeal- Mechanical Soft -- Pharyngeal -- Pharyngeal- Regular WFL Pharyngeal -- Pharyngeal- Multi-consistency -- Pharyngeal -- Pharyngeal- Pill -- Pharyngeal -- Pharyngeal Comment --  CHL IP CERVICAL ESOPHAGEAL PHASE 03/03/2020 Cervical Esophageal Phase WFL Pudding Teaspoon -- Pudding Cup -- Honey Teaspoon -- Honey Cup -- Nectar Teaspoon -- Nectar Cup -- Nectar Straw -- Thin Teaspoon -- Thin Cup -- Thin Straw -- Puree -- Mechanical Soft -- Regular -- Multi-consistency -- Pill -- Cervical Esophageal Comment -- [place] Siren 03/03/2020, 4:35 PM Orbie Pyo Litaker M.Ed Actor Pager [phone] Office [phone]              VAS [place] LOWER EXTREMITY VENOUS (DVT)  Result Date: 02/23/2020  Lower Venous DVTStudy Indications: Elevated D dimer. Other Indications: + Covid. Risk Factors: None identified. Performing Technologist: Griffin Basil RCT RDMS  Examination Guidelines: A complete evaluation includes B-mode imaging, spectral Doppler, color Doppler, and power Doppler as needed of all accessible portions of each vessel. Bilateral testing is considered an integral part of a complete examination. Limited examinations for reoccurring indications may be performed as noted. The reflux portion of the exam is performed with the patient in reverse Trendelenburg.  +---------+---------------+---------+-----------+----------+--------------+ RIGHT    CompressibilityPhasicitySpontaneityPropertiesThrombus Aging +---------+---------------+---------+-----------+----------+--------------+ CFV      Full           Yes      Yes                                 +---------+---------------+---------+-----------+----------+--------------+ SFJ      Full                                                         +---------+---------------+---------+-----------+----------+--------------+ FV Prox  Full                                                        +---------+---------------+---------+-----------+----------+--------------+ FV Mid   Full                                                        +---------+---------------+---------+-----------+----------+--------------+ FV DistalFull                                                        +---------+---------------+---------+-----------+----------+--------------+ PFV      Full                                                        +---------+---------------+---------+-----------+----------+--------------+  POP      Full           Yes      Yes                                 +---------+---------------+---------+-----------+----------+--------------+ PTV      Full                                                        +---------+---------------+---------+-----------+----------+--------------+ PERO     Full                                                        +---------+---------------+---------+-----------+----------+--------------+   +---------+---------------+---------+-----------+----------+--------------+ LEFT     CompressibilityPhasicitySpontaneityPropertiesThrombus Aging +---------+---------------+---------+-----------+----------+--------------+ CFV      Full           Yes      Yes                                 +---------+---------------+---------+-----------+----------+--------------+ SFJ      Full                                                        +---------+---------------+---------+-----------+----------+--------------+ FV Prox  Full                                                        +---------+---------------+---------+-----------+----------+--------------+ FV Mid   Full                                                         +---------+---------------+---------+-----------+----------+--------------+ FV DistalFull                                                        +---------+---------------+---------+-----------+----------+--------------+ PFV      Full                                                        +---------+---------------+---------+-----------+----------+--------------+ POP      Full           Yes      Yes                                 +---------+---------------+---------+-----------+----------+--------------+  PTV      Full                                                        +---------+---------------+---------+-----------+----------+--------------+ PERO     Full                                                        +---------+---------------+---------+-----------+----------+--------------+     Summary: RIGHT: - There is no evidence of deep vein thrombosis in the lower extremity.  - No cystic structure found in the popliteal fossa.  LEFT: - There is no evidence of deep vein thrombosis in the lower extremity.  - No cystic structure found in the popliteal fossa.  *See table(s) above for measurements and observations. Electronically signed by Harold Barban MD on 02/23/2020 at 9:31:43 PM.    Final    US Abdomen Limited RUQ  Result Date: 02/23/2020 CLINICAL DATA:  Transaminitis EXAM: ULTRASOUND ABDOMEN LIMITED RIGHT UPPER QUADRANT COMPARISON:  None. FINDINGS: Gallbladder: The gallbladder was not visualized and may be surgically absent Common bile duct: Diameter: 3 mm Liver: The liver parenchyma is coarsened and heterogeneous. The liver surface appears somewhat nodular there is no discrete hepatic mass. There is suggestion of bidirectional flow within the portal vein Other: The right kidney appears atrophic and echogenic. There are multiple right-sided renal cysts measuring up to approximately 3.6 cm. IMPRESSION: 1. Gallbladder not visualized. 2. Coarsened and heterogeneous appearance  of the liver parenchyma with findings suspicious for underlying cirrhosis. 3. Borderline bidirectional flow within the main portal vein is suggestive of portal hypertension. 4. Echogenic and atrophic appearing right kidney consistent with the patient's history of renal failure. Electronically Signed   By: Constance Holster M.D.   On: 02/23/2020 19:21

## 2020-03-07 NOTE — Consult Note (Addendum)
  Rickard Apelles Samil Mecham. is a 69 y.o. male with significant history of hypertension, hyperlipidemia,.  End-stage renal on hemodialysis Monday Wednesday and Friday, type 2 diabetes, colon cancer status post resection, anemia of chronic disease who presented to the emergency department for evaluation of confusion and COVID-19 positive.  Patient with extended length of stay that has been complicated by multiple comorbidities as noted above.  There have been psychiatric consults.  On cooperative at times, flat affect, daughter suspects underlying psych disease, cognitive issues.  Per chart review patient has no previous psychiatric diagnoses.  Prior to this hospital admission patient was a resident at [facility].  Per chart review patient continued to have some confusion as noted by his recent attempt to remove his IV, and refusing to allow anyone to be place.  Per his recent hospitalist documentation patient's condition has been stable, he is currently taking low-dose Seroquel 25 mg p.o. nightly.  We will increase Seroquel to 25 mg p.o. twice daily.  Will repeat EKG, last EKG obtained September 19, QTC of 439.  Patient is also being treated for acute metabolic encephalopathy.  He has a history of short-term memory issues, and questionable dementia. Unable to complete psychological evaluation at this time Patient will benefit from psych evaluation. It appears this psych consult was placed as a result of his daughter request. Chart documentation shows she wants him placed at another facility. Will continue to attempt to evaluate patient and then follow up with family concerns.

## 2020-03-07 NOTE — Progress Notes (Signed)
Physical Therapy Treatment Patient Details Name: [name]. MRN: [id number] DOB: 12/20/1950 Today's Date: 03/07/2020    History of Present Illness Pt is a 69 y.o. male with d/c to SNF after prolonged complicated admission s/p C4-T2 laminectomy fusion (01/06/20), now admitted from [facility] 02/20/20 with confusion. Workup for metabolic encephalopathy, shock hypoxia; (+) COVID-19. 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 ETT 9/22-9/25. PMH includes HTN, HLD, ESRD (HD MWF), colon CA s/p resection.    PT Comments    Pt reluctantly agreeable to get out of bed when found to be sitting in urine. Pt is limited in safe mobility by decreased cognition, in presence of decreased strength, balance and endurance. Pt is max A for bed mobility, minA for sit to stand but maxA for pivot transfer to chair. Once pt in recliner, he scooted down to reduce pressure on his sacrum. Removed urine soaked pad which had ridden up his back. Sacral wound noted and requested RN order air bed to reduce sacral pressure in bed. D/c plans remain appropriate. PT will continue to follow acutely.    Follow Up Recommendations  SNF;Supervision/Assistance - 24 hour     Equipment Recommendations  None recommended by PT       Precautions / Restrictions Precautions Precautions: Fall;Other (comment) Precaution Comments: Rectal tube,  Restrictions Weight Bearing Restrictions: No    Mobility  Bed Mobility Overal bed mobility: Needs Assistance Bed Mobility: Rolling;Sidelying to Sit Rolling: Mod assist Sidelying to sit: Max assist;+2 for physical assistance       General bed mobility comments: Max A +2 to power up into upright posture from sidelying. Pt with decreased sequencing and problem solving requiring Max cues.   Transfers Overall transfer level: Needs assistance Equipment used: 2 person hand held assist Transfers: Sit to/from [facility] Sit to  Stand: Min assist;+2 physical assistance Stand pivot transfers: Max assist;+2 physical assistance       General transfer comment: Min A +2 to power up into standing and gain balance. Max A +2 for maintaiing balance during pivot. Pt presenting with posterior lean, poor awareness, and decreased coordination.   Ambulation/Gait             General Gait Details: unable         Balance Overall balance assessment: Needs assistance Sitting-balance support: Feet supported Sitting balance-Leahy Scale: Fair Sitting balance - Comments: Sitting at EOB with close Min guard A   Standing balance support: Bilateral upper extremity supported;During functional activity Standing balance-Leahy Scale: Poor Standing balance comment: Requiring Min-Max A for standing balance                            Cognition Arousal/Alertness: Awake/alert;Lethargic Behavior During Therapy: Flat affect Overall Cognitive Status: No family/caregiver present to determine baseline cognitive functioning Area of Impairment: Attention;Memory;Following commands;Safety/judgement;Awareness;Problem solving                 Orientation Level: Disoriented to;Place;Time;Situation Current Attention Level: Focused Memory: Decreased short-term memory Following Commands: Follows one step commands with increased time;Follows one step commands inconsistently Safety/Judgement: Decreased awareness of safety;Decreased awareness of deficits Awareness: Intellectual Problem Solving: Slow processing;Difficulty sequencing;Requires verbal cues;Requires tactile cues General Comments: Poor ST memory to recall cues provided 30-60 sec prior such as pt asking "what are we doing?" after cues to sit at EOB. Pt recalling that therapist said lunch would come soon at begining of session  Exercises      General Comments General comments (skin integrity, edema, etc.): Pt not on monitoring upon arrival SaO2 100% on RA.        Pertinent Vitals/Pain Pain Assessment: Faces Faces Pain Scale: Hurts a little bit Pain Location: "My tummy hurts" Pain Descriptors / Indicators: Grimacing;Guarding Pain Intervention(s): Monitored during session           PT Goals (current goals can now be found in the care plan section) Acute Rehab PT Goals Patient Stated Goal: "I want to go home" PT Goal Formulation: Patient unable to participate in goal setting Time For Goal Achievement: 03/06/20 Potential to Achieve Goals: Fair Progress towards PT goals: Progressing toward goals    Frequency    Min 2X/week      PT Plan Current plan remains appropriate    Co-evaluation   Reason for Co-Treatment: For patient/therapist safety PT goals addressed during session: Mobility/safety with mobility OT goals addressed during session: ADL's and self-care      AM-PAC PT "6 Clicks" Mobility   Outcome Measure  Help needed turning from your back to your side while in a flat bed without using bedrails?: Total Help needed moving from lying on your back to sitting on the side of a flat bed without using bedrails?: Total Help needed moving to and from a bed to a chair (including a wheelchair)?: Total Help needed standing up from a chair using your arms (e.g., wheelchair or bedside chair)?: Total Help needed to walk in hospital room?: Total Help needed climbing 3-5 steps with a railing? : Total 6 Click Score: 6    End of Session Equipment Utilized During Treatment: Oxygen Activity Tolerance: Patient tolerated treatment well;Patient limited by fatigue Patient left: in chair;with call bell/phone within reach;with chair alarm set;Other (comment) (increased sliding down in chair to keep of sacrum) Nurse Communication: Mobility status;Need for lift equipment (Need for air bed due to sacral wound ) PT Visit Diagnosis: Other abnormalities of gait and mobility (R26.89);Muscle weakness (generalized) (M62.81);Other symptoms and signs  involving the nervous system (W97.989)     Time: 2119-4174 PT Time Calculation (min) (ACUTE ONLY): 37 min  Charges:  $Therapeutic Activity: 8-22 mins                     Sherice Ijames B. Migdalia Dk PT, DPT Acute Rehabilitation Services Pager [phone] Office [phone]    [place] 03/07/2020, 1:56 PM

## 2020-03-08 DIAGNOSIS — D638 Anemia in other chronic diseases classified elsewhere: Secondary | ICD-10-CM | POA: Diagnosis not present

## 2020-03-08 DIAGNOSIS — G9341 Metabolic encephalopathy: Secondary | ICD-10-CM | POA: Diagnosis not present

## 2020-03-08 DIAGNOSIS — D696 Thrombocytopenia, unspecified: Secondary | ICD-10-CM | POA: Diagnosis not present

## 2020-03-08 LAB — CBC
HCT: 18.4 % — ABNORMAL LOW (ref 39.0–52.0)
Hemoglobin: 5.9 g/dL — CL (ref 13.0–17.0)
MCH: 26.9 pg (ref 26.0–34.0)
MCHC: 32.1 g/dL (ref 30.0–36.0)
MCV: 84 fL (ref 80.0–100.0)
Platelets: 127 10*3/uL — ABNORMAL LOW (ref 150–400)
RBC: 2.19 MIL/uL — ABNORMAL LOW (ref 4.22–5.81)
RDW: 22.4 % — ABNORMAL HIGH (ref 11.5–15.5)
WBC: 7.8 10*3/uL (ref 4.0–10.5)
nRBC: 0 % (ref 0.0–0.2)

## 2020-03-08 LAB — RENAL FUNCTION PANEL
Albumin: 2 g/dL — ABNORMAL LOW (ref 3.5–5.0)
Anion gap: 13 (ref 5–15)
BUN: 40 mg/dL — ABNORMAL HIGH (ref 8–23)
CO2: 23 mmol/L (ref 22–32)
Calcium: 7.7 mg/dL — ABNORMAL LOW (ref 8.9–10.3)
Chloride: 103 mmol/L (ref 98–111)
Creatinine, Ser: 8.04 mg/dL — ABNORMAL HIGH (ref 0.61–1.24)
GFR calc non Af Amer: 6 mL/min — ABNORMAL LOW (ref >60)
Glucose, Bld: 83 mg/dL (ref 70–99)
Phosphorus: 5.1 mg/dL — ABNORMAL HIGH (ref 2.5–4.6)
Potassium: 3.8 mmol/L (ref 3.5–5.1)
Sodium: 139 mmol/L (ref 135–145)

## 2020-03-08 LAB — GLUCOSE, CAPILLARY
Glucose-Capillary: 119 mg/dL — ABNORMAL HIGH (ref 70–99)
Glucose-Capillary: 80 mg/dL (ref 70–99)

## 2020-03-08 LAB — PREPARE RBC (CROSSMATCH)

## 2020-03-08 MED ORDER — DARBEPOETIN ALFA 150 MCG/0.3ML IJ SOSY
PREFILLED_SYRINGE | INTRAMUSCULAR | Status: AC
  Filled 2020-03-08: qty 0.3

## 2020-03-08 MED ORDER — SODIUM CHLORIDE 0.9% IV SOLUTION: Freq: Once | INTRAVENOUS | Status: DC

## 2020-03-08 MED ORDER — DARBEPOETIN ALFA 150 MCG/0.3ML IJ SOSY
PREFILLED_SYRINGE | INTRAMUSCULAR | Status: AC
  Administered 2020-03-08: 150 ug via INTRAVENOUS
  Filled 2020-03-08: qty 0.3

## 2020-03-08 NOTE — Progress Notes (Signed)
Patient arrived to unit a/o/v to self and place, denies pain at this time.Will continue to monitor.

## 2020-03-08 NOTE — Progress Notes (Signed)
[facility] Progress Note  Subjective:   Pt seen on HD, having abd pain, is signing off at 2 hrs of HD.  Was not able to convince him to stay on. Hb 5.9, did not get prbc's in HD.  Has no IV access, refusing IV and other Rx's.   Vitals:   03/08/20 0915 03/08/20 0924 03/08/20 1012 03/08/20 1333  BP: (!) 156/72 (!) 153/70 (!) 146/58 (!) 149/68  Pulse: 90 91  87  Resp:  16 20 (!) 21  Temp:  98.1 F (36.7 C) 98.7 F (37.1 C) 97.6 F (36.4 C)  TempSrc:  Oral Oral Oral  SpO2: 100% 99%    Weight:  84.4 kg    Height:        Exam:  alert, nad   no jvd  Chest cta bilat  Cor reg no RG  Abd soft ntnd no ascites   Ext no LE edema   Alert, NF, ox3  +TDC/ L AVF+bruit       CXR 9/23 - patchy perihilar/ basilar dz, no edema      OP HD: [facility] MWF   4h 47min  87kg  2/2 bath  TDC / L AVF  Hep none  -Mircera 150 mcg IV q 2 weeks (last dose 02/09/2020) -Venofer 100 mg IV X 8 doses (not started yet) -Calcitriol 1.5 mcg PO TIW  Assessment/ Plan: 1. COVID 19-recent diagnosis. Tested positive at [facility] 09/18 and again here.  Per primary team. 2. Acute hypoxic resp failure.  At one point required mechanical ventilation. Resolved.  3. AMS - better now, responsive and interacting, but continues to refuse some medical Rx's 4. Recent epidural abscess/MRSA cervical discitis-  S/p decompression surgery 01/06/20. S/p Vancomycin and Rifampin here through 10/01 per charting. s/p cefazolin.  5.  ESRD - MWF HD. 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 HD got 2hrs today, signed off early for abd pain. Next HD Friday.  6.  HD access - L BC AVF placed Feb 2021. Was used starting in May and had infiltration in Aug prompting [facility] placement to rest the AVF. Have used AVF w/o issues on last 3 HD sessions. Will consider TDC removal , although he is refusing IV's so may need to use it for PRBC's today.  7.  Hypertension/volume - Previously on high dose midodrine here and this is now off.  Keep even on HD (previously  on anti-hypertensives at home), has not tolerated UF here lately.  8. Anemia  - aranesp increased to 150 mcg for 9/29 onward for now. Hold OP Fe load.  s/p PRBC's 9/29 and may need again soon  9.  Metabolic bone disease - off renvela due to hypophosphatemia and improved last check.  Anticipate need to resume at a lower dose per trends. on calcitriol - corrected Ca ok 10.  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 no heparin with HD (0.601 and upper limit of normal 0.4).  Currently on bivalirudin per primary team.  He had been refusing lab draws at one point.  Kelly Splinter, MD 03/08/2020, 2:11 PM       Recent Labs  Lab 03/02/20 1202 03/03/20 0929 03/05/20 1043 03/08/20 0808  K 4.2   < > 4.0 3.8  BUN 45*   < > 49* 40*  CREATININE 6.04*   < > 6.92* 8.04*  CALCIUM 7.9*   < > 8.2* 7.7*  PHOS 2.5  --   --  5.1*  HGB  8.2*   < > 8.1* 5.9*   < > = values in this interval not displayed.   Inpatient medications:  sodium chloride   Intravenous Once   sodium chloride   Intravenous Once   calcitRIOL  1.5 mcg Oral Q M,W,F-HD   chlorhexidine  15 mL Mouth Rinse BID   Chlorhexidine Gluconate Cloth  6 each Topical Q0600   collagenase   Topical Daily   darbepoetin (ARANESP) injection - DIALYSIS  150 mcg Intravenous Q Wed-HD   doxycycline  100 mg Oral Q12H   feeding supplement (NEPRO CARB STEADY)  237 mL Oral BID BM   insulin aspart  0-5 Units Subcutaneous QHS   insulin aspart  0-9 Units Subcutaneous TID WC   insulin detemir  5 Units Subcutaneous Daily   mouth rinse  15 mL Mouth Rinse q12n4p   multivitamin  1 tablet Oral QHS   pantoprazole  40 mg Oral Daily   QUEtiapine  25 mg Oral BID   thiamine  100 mg Oral Daily   zinc sulfate  220 mg Oral Daily    sodium chloride Stopped (02/21/20 2227)   sodium chloride, haloperidol lactate, lip balm, loperamide, metoprolol tartrate, ondansetron **OR** ondansetron (ZOFRAN) IV, Resource ThickenUp  Clear   Sol Blazing, MD 03/08/2020  2:11 PM

## 2020-03-08 NOTE — Consult Note (Signed)
  Attempted psychiatric consult with [name]. 69 y.o., male patient this morning.  A secure message sent to patient nurse who informed she had spoke to charge nurse an [place] were only with [facility] and that they are missing and other iPads on unit do not have the software to do [facility] consults.    Dr. Hampton Abbot reviewed patient chart 03/07/2020 and recommendations were made: to increase Seroquel to 25 mg Bid and repeat EKG.  Prior EKG 02/19/20 QTc of 439  Patient chart reviewed:  EKG 03/07/2020 QTc 476.  Would continue with the recommendations made by Dr. Dwyane Dee.  Regular monitor EKG related to increase in QTc prolongation.

## 2020-03-08 NOTE — Progress Notes (Addendum)
[facility] consulted to assess and access pt's HD catheter for blood transfusion. Care order from nephrology in chart to use permanent HD catheter for transfusion as pt is refusing IV stick. SecureChat sent to [employer], MD to inquire which lumen should be used; he replied either lumen could be used. Contacted unit RN who stated patient would be transferred to another unit shortly. Advised pt would be seen on new unit.

## 2020-03-08 NOTE — Progress Notes (Signed)
PROGRESS NOTE                                                                                                                                                                                                             Patient Demographics:    [name], is a 69 y.o. male, DOB - September 09, 1950, EGB:[id number]  Outpatient Primary MD for the patient is [facility]   Admit date - 02/20/2020   LOS - 47  Chief Complaint  Patient presents with  . Covid Positive  . AMS       Brief Narrative: Patient is a 69 y.o. male with PMHx of ESRD on HD MWF, DM-2, MRSA bacteremia with epidural abscess s/p C4-T2 laminectomy/fusion on 01/06/2020-on IV vancomycin/rifampin through 10/1-resident of SNF-apparently tested positive for COVID-19 on 9/18-subsequently brought to the ED on 9/19 for worsening confusion (more than baseline)-fever-thought to have acute metabolic encephalopathy from COVID-19 infection and admitted to the hospitalist service.  Further hospital course complicated by development of hypotension/severe encephalopathy requiring intubation and ICU transfer.  See below for further details.  COVID-19 vaccinated status: Unvaccinated  Significant Events: 8/3-8/23>> hospitalized for epidural abscess/MRSA bacteremia-underwent laminectomy-briefly intubated due to altered mental status.  Discharged to SNF 9/18>> COVID-19 positive at SNF 9/19>> Admit to [facility] for fever/confusion 9/22>> obtunded/hypotensive after coming back from hemodialysis-emergently intubated/transferred to ICU 9/30>> transferred back to [facility] 9/30>> SQ heparin stopped-pharmacy consulted for HIT protocol.  Significant studies: 8/6>> Echo: EF> 75% 9/19>>Chest x-ray: No acute abnormality in the lung. 9/19>> CT head: No acute intracranial abnormality 9/19>> CT C-spine: Postoperative findings of posterior laminectomy/fusion C4-T1, prevertebral soft tissue edema  appears reduced, postoperative fluid collection overlies laminectomy site does not appear significantly changed compared to prior examination. 9/20>> MRI C-spine: Attempted but due to AMS-not completed 9/21>> MRI C Spine: Osteomyelitis of skull base, anterior C1 and odontoid, C2-C3 facet joint-associated erosion of bone since 8/27-resolved epidural abscess 9/22>> coarsened/heterogeneous appearance of the liver parenchyma-suspicious for cirrhosis, gallbladder not visualized. 9/22>> bilateral lower extremity Doppler: No DVT 9/29>> chest x-ray: Bibasilar infiltrates suggesting multifocal pneumonia  COVID-19 medications: Remdesivir: 9/19>> 9/21  Antibiotics: IV vancomycin plan to continue through October 1 (rifampin stopped 9/21 due to elevated LFTs) Ancef: 9/26>> Zosyn: 9/24>> 9/26  Microbiology data: 9/20 >>blood culture: No growth so far 9/21>> blood culture: No growth so far 9/24>> sputum  culture: E. coli/MSSA  Procedures: ET tube 9/22 > 9/25  Consults: Nephrology PCCM GI  DVT prophylaxis: heparin injection 5,000 Units Start: 02/20/20>> stopped on 9/30 due to worsening thrombocytopenia. SCDs Start: 02/20/20 1628   Subjective:   Patient seen at hemodialysis. Denies any complaints. His blood was drawn there and hemoglobin was reported as being low.  Transfusion was ordered however patient wanted to cut short so dialysis treatment.  He apparently has been refusing blood transfusion.     Assessment  & Plan :   Acute hypoxic respiratory failure secondary to HCAP (E. coli/MSSA in sputum culture) and COVID-19 pneumonia Initially admitted and thought to have mild disease. Started on steroid/Remdesivir. But decompensated on 9/22 requiring intubation and ICU transfer.  Extubated 9/26.  Found to have MSSA in the sputum culture.  Given intravenous cefazolin till 10/1. Remdesivir stopped on 9/22 due to significantly elevated LFTs.    Respiratory status has been stable.  He is saturating  normal on room air.  Tested positive on 9/19.  He can end isolation on 10/10.  Septic shock, resolved Required pressors while in the ICU. Thought to be most likely secondary to HCAP although patient has a history of recent MRSA bacteremia/cervical spine osteomyelitis/abscess.   Acute metabolic encephalopathy Multifactorial etiology-on initial presentation to the hospital was felt to be due to uremia (due to under dialysis, patient got short outpatient HD numerous times), sepsis physiology from COVID-19. However his mental status markedly deteriorated on 9/22 likely due to worsening sepsis physiology from HCAP.  Post extubation has had issues with delirium/likely related to intubation/ICU stay.  Required Precedex while in the ICU.  Patient mental status has been stable for the last several days.  However he has been refusing medical interventions including blood draws, placement of IV access and medications.  Does not give a reason for the same.  He remains on Seroquel.  Dose was increased 2 days ago.  Psychiatry was consulted and is pending.  Apparently some technical issues have prevented use of iPads.  Discussed with charge nurse who will try to address today.   He apparently has short-term memory issues. There was some thought as to whether patient has underlying dementia. Has a history of alcohol/cocaine use in the past.  Add thiamine.  Dysphagia He was getting tube feedings however the tube came out on 10/3.  Orally.  No aspiration noted.  Speech therapy has been following.     Elevated D-dimer Secondary to COVID-19.  He did not have any significant hypoxia.  Lower extremity Dopplers were negative.  Since he has clinically improved will not pursue any further work-up.  D-dimer was 6.16 on October 1.  Does not look like it was checked today.  However considering clinical improvement we can hold off.  History of MRSA bacteremia with epidural abscess requiring C4-T2 laminectomy:  MRI C-spine  completed on 9/21. Appears to have residual osteomyelitis of the skull base area. Was discussed with neurosurgeon Dr. Kathyrn Sheriff on 9/21. No further surgical intervention required.  Plans were to continue vancomycin/rifampin through October 1-however rifampin discontinued due to significantly elevated LFTs.  Patient now off of vancomycin.  Started on doxycycline from 10/1.  Patient will need follow-up with infectious disease clinic post discharge.  Thrombocytopenia Suspect this is multifactorial in etiology from acute illness/sepsis/thrombocytopenia-potentially some contribution from underlying liver cirrhosis-however significant drop from what he first presented with-hence HIT was in the differential Subcutaneous heparin was discontinued.  HIT panel was ordered..  Patient started on bivalirudin in  the interim.   Patient has been refusing blood draws.  IV access was lost.  He refused another IV access.  Bivalirudin.  Was discontinued since patient was not being cooperative with IV access and blood draws.   The heparin-induced platelet antibody is 0.60.  Slightly above the normal range.  Serotonin release assay is normal. Patient's 4 T score was 6.  The heparin-induced antibody is 0.6 as discussed above.  With serotonin assay being normal this is unlikely to be HIT.  Platelet counts have improved to 127,000.  Normocytic Anemia Secondary to ESRD. Worsened by acute illness-s/p 1 unit of PRBC on 9/29.  Hemoglobin noted to be 5.9 today.  2 units of PRBC ordered however patient cut short his dialysis treatment.  He has been refusing IV access and blood transfusion despite being told the complications that can occur with severe anemia.  Will discuss with his daughter.    Shock liver LFTs have normalized.    History of HCV/liver cirrhosis HCV antibody positive in the past.  Viral load was done and no hepatitis C quantitative was detected.   Liver ultrasound with suggestion of cirrhotic appearance.  Will need  outpatient GI follow-up.  DM-2 (A1c 6.5 on 8/11):  Appears to have brittle diabetes resulting in hypoglycemic episodes while in the ICU.  Avoid tight glycemic control.  Dose of Levemir was decreased.  CBGs noted to be stable.  Continue to monitor.  ESRD on HD MWF Nephrology following.  Patient being dialyzed as per his usual schedule.  Patient has been curtailing his dialysis treatment.  Sacral wound/loose stools Wound care is following for sacral wounds.  Loose stools most likely due to tube feedings.  Abdomen is benign.  WBC is normal.   Currently has a Flexi-Seal.  Mainly present to protect the wounds that he has in the lower back area.  Continue Imodium.  Hopefully we can discontinue the Flexi-Seal soon.    Metabolic acidosis Likely secondary to incomplete HD treatments. Resolved with regular HD sessions.  Hyperkalemia Likely secondary to incomplete dialysis treatments, resolved with hemodialysis  Essential hypertension  Tolerate higher blood pressure in this patient who undergoes dialysis.  Continue to monitor.    Hyperlipidemia Statin on hold-should be able to resume when closer to discharge.  Questionable dementia Discussed with patient's daughter.  She is not certain if patient truly has cognitive impairment or dementia.  Psychiatry consulted.  Deconditioning/debility: Secondary to acute illness on top of chronic debility.  PT eval completed-SNF on discharge.  GI prophylaxis: PPI  Condition - Stable  Family Communication  :  Daughter Chevon Laufer [phone])  Code Status :  Full Code  Diet :  Diet Order            DIET DYS 2 Room service appropriate? No; Fluid consistency: Nectar Thick  Diet effective now                  Disposition Plan  : Plan is for the patient to go to skilled nursing facility.  Status is: Inpatient  Remains inpatient appropriate because:Inpatient level of care appropriate due to severity of illness  Dispo: The patient is from:  SNF              Anticipated d/c is to: SNF              Anticipated d/c date is: Hopefully by the end of this week  Patient currently is not medically stable to d/c.    Antimicorbials  :    Anti-infectives (From admission, onward)   Start     Dose/Rate Route Frequency Ordered Stop   03/06/20 2000  doxycycline (VIBRA-TABS) tablet 100 mg        100 mg Oral Every 12 hours 03/06/20 1632     03/04/20 1000  doxycycline (VIBRA-TABS) tablet 100 mg  Status:  Discontinued        100 mg Oral Every 12 hours 03/01/20 0944 03/04/20 0741   03/04/20 1000  doxycycline (VIBRA-TABS) tablet 100 mg  Status:  Discontinued        100 mg Per Tube Every 12 hours 03/04/20 0741 03/06/20 1632   02/27/20 1500  ceFAZolin (ANCEF) IVPB 1 g/50 mL premix        1 g 100 mL/hr over 30 Minutes Intravenous Every 24 hours 02/27/20 1433 03/02/20 1445   02/25/20 1000  piperacillin-tazobactam (ZOSYN) IVPB 2.25 g  Status:  Discontinued        2.25 g 100 mL/hr over 30 Minutes Intravenous Every 8 hours 02/25/20 0905 02/27/20 1433   02/25/20 0830  piperacillin-tazo (ZOSYN) NICU IV syringe 225 mg/mL  Status:  Discontinued        100 mg/kg of piperacillin  84.3 kg 84.4 mL/hr over 30 Minutes Intravenous Every 8 hours 02/25/20 0825 02/25/20 0905   02/25/20 0814  vancomycin (VANCOCIN) 1-5 GM/200ML-% IVPB       Note to Pharmacy: Cherylann Banas   : cabinet override      02/25/20 0814 02/25/20 1158   02/23/20 0642  vancomycin (VANCOCIN) 1-5 GM/200ML-% IVPB       Note to Pharmacy: [place], [place] : cabinet override      02/23/20 0642 02/23/20 1859   02/21/20 1200  vancomycin (VANCOCIN) IVPB 1000 mg/200 mL premix        1,000 mg 200 mL/hr over 60 Minutes Intravenous Every M-W-F (Hemodialysis) 02/20/20 1959 03/03/20 1130   02/21/20 1000  remdesivir 100 mg in sodium chloride 0.9 % 100 mL IVPB  Status:  Discontinued       "Followed by" Linked Group Details   100 mg 200 mL/hr over 30 Minutes Intravenous Daily 02/20/20  1632 02/23/20 0633   02/20/20 2000  rifampin (RIFADIN) capsule 300 mg  Status:  Discontinued        300 mg Oral Every 12 hours 02/20/20 1907 02/23/20 0633   02/20/20 1730  remdesivir 200 mg in sodium chloride 0.9% 250 mL IVPB       "Followed by" Linked Group Details   200 mg 580 mL/hr over 30 Minutes Intravenous Once 02/20/20 1632 02/20/20 1913      Inpatient Medications  Scheduled Meds: . sodium chloride   Intravenous Once  . sodium chloride   Intravenous Once  . calcitRIOL  1.5 mcg Oral Q M,W,F-HD  . chlorhexidine  15 mL Mouth Rinse BID  . Chlorhexidine Gluconate Cloth  6 each Topical Q0600  . collagenase   Topical Daily  . darbepoetin (ARANESP) injection - DIALYSIS  150 mcg Intravenous Q Wed-HD  . doxycycline  100 mg Oral Q12H  . feeding supplement (NEPRO CARB STEADY)  237 mL Oral BID BM  . insulin aspart  0-5 Units Subcutaneous QHS  . insulin aspart  0-9 Units Subcutaneous TID WC  . insulin detemir  5 Units Subcutaneous Daily  . mouth rinse  15 mL Mouth Rinse q12n4p  . multivitamin  1 tablet Oral QHS  . pantoprazole  40 mg Oral Daily  . QUEtiapine  25 mg Oral BID  . thiamine  100 mg Oral Daily  . zinc sulfate  220 mg Oral Daily   Continuous Infusions: . sodium chloride Stopped (02/21/20 2227)   PRN Meds:.sodium chloride, haloperidol lactate, lip balm, loperamide, metoprolol tartrate, ondansetron **OR** ondansetron (ZOFRAN) IV, Resource ThickenUp Clear    Bonnielee Haff M.D on 03/08/2020 at 10:02 AM  To page go to [email or web address] - use universal password  [facility] -  Office  [phone]    Objective:   Vitals:   03/08/20 0830 03/08/20 0900 03/08/20 0915 03/08/20 0924  BP: (!) 157/78 (!) 163/77 (!) 156/72 (!) 153/70  Pulse: 83 84 90 91  Resp:    16  Temp:    98.1 F (36.7 C)  TempSrc:    Oral  SpO2: 100% 100% 100% 99%  Weight:    84.4 kg  Height:        Wt Readings from Last 3 Encounters:  03/08/20 84.4 kg  01/28/20 86.2 kg  01/24/20 89.4 kg       Intake/Output Summary (Last 24 hours) at 03/08/2020 1002 Last data filed at 03/08/2020 0924 Gross per 24 hour  Intake --  Output 260 ml  Net -260 ml     Physical Exam  General appearance: Awake alert.  In no distress.  Distracted Resp: Clear to auscultation bilaterally.  Normal effort Cardio: S1-S2 is normal regular.  No S3-S4.  No rubs murmurs or bruit GI: Abdomen is soft.  Nontender nondistended.  Bowel sounds are present normal.  No masses organomegaly Extremities: No edema.  Moving all his extremities.        Data Review:    CBC Recent Labs  Lab 03/02/20 1202 03/03/20 0929 03/04/20 1555 03/05/20 1043 03/08/20 0808  WBC 8.9 8.8 9.5 8.3 7.8  HGB 8.2* 7.6* 7.6* 8.1* 5.9*  HCT 26.2* 24.2* 24.3* 25.7* 18.4*  PLT 62* 62* 90* 94* 127*  MCV 86.8 86.1 85.6 85.1 84.0  MCH 27.2 27.0 26.8 26.8 26.9  MCHC 31.3 31.4 31.3 31.5 32.1  RDW 23.3* 22.8* 22.9* 22.8* 22.4*    Chemistries  Recent Labs  Lab 03/02/20 1202 03/03/20 0929 03/04/20 1555 03/05/20 1043 03/08/20 0808  NA 139 136 134* 138 139  K 4.2 4.2 3.9 4.0 3.8  CL 103 103 99 101 103  CO2 26 23 25 23 23   GLUCOSE 224* 184* 166* 134* 83  BUN 45* 57* 38* 49* 40*  CREATININE 6.04* 7.54* 5.89* 6.92* 8.04*  CALCIUM 7.9* 7.9* 7.9* 8.2* 7.7*  AST 35 28 32 30  --   ALT 20 11 8 9   --   ALKPHOS 108 92 98 93  --   BILITOT 0.8 1.1 1.0 1.3*  --     Radiology Reports CT Head Wo Contrast  Result Date: 02/20/2020 CLINICAL DATA:  Delirium.  COVID-19 positive.  Recent neck surgery. EXAM: CT HEAD WITHOUT CONTRAST TECHNIQUE: Contiguous axial images were obtained from the base of the skull through the vertex without intravenous contrast. COMPARISON:  January 28, 2020 FINDINGS: Brain: No subdural, epidural, or subarachnoid hemorrhage. A lacunar infarct in the right thalamus is stable and nonacute. Moderate white matter changes again identified. No acute cortical ischemia or acute infarct noted. No mass effect or midline  shift. Ventricles and sulci are unchanged. Cerebellum, brainstem, and basal cisterns are normal. Vascular: Calcified atherosclerosis in the intracranial carotids. Skull: Normal. Negative for fracture or focal lesion. Sinuses/Orbits: Mild scattered mucosal thickening in  the ethmoid air cells and left maxillary sinus. No air-fluid levels. Mastoid air cells and middle ears are well aerated. Other: None. IMPRESSION: 1. No acute intracranial abnormalities. Chronic white matter changes. Chronic right thalamic lacunar infarct. 2. Mild sinus disease as above. Electronically Signed   By: Dorise Bullion III M.D   On: 02/20/2020 14:49   CT Cervical Spine Wo Contrast  Result Date: 02/20/2020 CLINICAL DATA:  COVID positive, recent neck surgery EXAM: CT CERVICAL SPINE WITHOUT CONTRAST TECHNIQUE: Multidetector CT imaging of the cervical spine was performed without intravenous contrast. Multiplanar CT image reconstructions were also generated. COMPARISON:  CT cervical spine, 01/28/2020 FINDINGS: Alignment: Normal. Skull base and vertebrae: No acute fracture. No primary bone lesion. Erosive change of the dens and adjacent transverse process of C1 are not significantly changed compared to prior examination. Soft tissues and spinal canal: Assessment of the soft tissues is somewhat limited by metallic streak artifact and lack of intravenous contrast; within this limitation, no unexpected fluid collection overlying the laminectomy site. Prevertebral soft tissue edema appears reduced compared to prior examination. No visible canal hematoma. Disc levels: Status post posterior laminectomy and fusion of C4 through T1, with moderate disc space height loss and osteophytosis of C6-C7 and C7-T1. Upper chest: Negative. Other: None. IMPRESSION: 1. Redemonstrated postoperative findings status post posterior laminectomy and fusion of C4 through T1, with moderate disc space height loss and osteophytosis of C6-C7 and C7-T1. 2. Prevertebral soft  tissue edema appears reduced compared to prior examination. 3. Postoperative fluid collection overlying laminectomy site does not appear significantly changed compared to prior examination. 4. Assessment of the soft tissues and epidural space is generally limited on noncontrast CT and additionally limited by metallic streak artifact; contrast enhanced MRI is the test of choice for evaluation of edema, fluid collections, the epidural space, and discitis osteomyelitis if clinically suspected in the postoperative setting. Electronically Signed   By: Eddie Candle M.D.   On: 02/20/2020 14:53   MR CERVICAL SPINE WO CONTRAST  Result Date: 02/22/2020 CLINICAL DATA:  69 year old male with history of dorsal cervical epidural abscess, status post operative drainage and decompression last month on 01/06/2020. Follow-up MRI 01/29/2020 with new left occipital condyle, anterior C1 and odontoid marrow edema and erosion compatible with skull base osteomyelitis. End stage renal disease on dialysis. Confusion. Positive COVID-19. EXAM: MRI CERVICAL SPINE WITHOUT CONTRAST TECHNIQUE: Multiplanar, multisequence MR imaging of the cervical spine was performed. No intravenous contrast was administered. COMPARISON:  Recent CT head and cervical spine 02/20/2020. Postoperative MRI 01/29/2020. Preoperative MRI FINDINGS: The examination had to be discontinued just prior to completion due to confusion, agitation. Only axial GRE imaging was not obtained. Alignment: Straightening and mild reversal of cervical lordosis remains improved since 01/04/2020. Vertebrae: Abnormal marrow edema and/or T2 and STIR hyperintense soft tissue surrounding the odontoid (series 3, image 7) which has been partially eroded along with the adjacent clivus and anterior C1 ring as seen on the recent CT. Mild marrow edema in both occipital condyles, greater on the left. Faint marrow edema also in the C2-C3 facets, mostly on the right (series 3, image 2). Overall the  appearance has not progressed since 01/22/2020. Sequelae of posterior decompression and fusion from C3 through T1. Mild hardware susceptibility artifact. No other cervical or upper thoracic marrow edema or acute osseous abnormality. Cord: Suboptimal cord detail due to motion and hardware susceptibility. No definite cervical or upper thoracic spinal cord signal abnormality. Normalized epidural space since August. Posterior Fossa, vertebral arteries, paraspinal tissues:  Major vascular flow voids are preserved in the neck. Postoperative changes to the posterior neck soft tissues with no adverse features. Negative visible lung apices. Cervicomedullary junction and visible posterior fossa are stable and within normal limits. Disc levels: C2-C3: Improved thecal sac patency since 01/04/2020. Foraminal endplate spurring and moderate bilateral facet degeneration. Mild residual spinal stenosis. Mild bilateral C3 foraminal stenosis. C3-C4: Chronic central disc protrusion (series 2, image 7) with mild facet and ligament flavum hypertrophy. Improved thecal sac patency since 01/04/2020 but residual mild spinal stenosis and mild cord mass effect. Moderate bilateral C4 foraminal stenosis. C4-C5 through T1-T2: Posterior decompression with no significant spinal stenosis despite residual disc and endplate degeneration at some levels. There is mild to moderate bilateral C8 foraminal stenosis related to disc, endplate and residual facet hypertrophy at C7-T1. IMPRESSION: 1. 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 Possible involvement of the right C2-C3 facet joint also. Associated erosion of bone since 01/28/2020 as demonstrated by CT two days ago. But on MRI no other significant progression is identified since August; no new levels of involvement. 2. Resolved posterior epidural abscess following surgery last month. Satisfactorily decompressed levels C4-C5 through T1-T2. 3. Improved thecal sac patency but mild residual  degenerative spinal stenosis at both C2-C3 and C3-C4. No definite spinal cord signal abnormality. Electronically Signed   By: Genevie Ann M.D.   On: 02/22/2020 11:48   DG CHEST PORT 1 VIEW  Result Date: 03/01/2020 CLINICAL DATA:  Respiratory distress.  COVID. EXAM: PORTABLE CHEST 1 VIEW COMPARISON:  02/24/2020 FINDINGS: The endotracheal tube is been removed in the enteric tube converted for a feeding tube. Enteric tube tip is off the field of view but below the left hemidiaphragm. Right central venous catheter is unchanged in position. No pneumothorax. Cardiac enlargement. Bilateral basilar infiltrates suggesting multifocal pneumonia. IMPRESSION: Bilateral basilar infiltrates suggesting multifocal pneumonia. Electronically Signed   By: Lucienne Capers M.D.   On: 03/01/2020 05:02   DG CHEST PORT 1 VIEW  Result Date: 02/24/2020 CLINICAL DATA:  COVID-19 positive 02/20/2020, respiratory failure, intubated EXAM: PORTABLE CHEST 1 VIEW COMPARISON:  02/23/2020 FINDINGS: Single frontal view of the chest demonstrates endotracheal tube well above carina. Enteric catheter passes below diaphragm tip excluded by collimation. Bilateral internal jugular catheters are unchanged. Cardiac silhouette is stable. Persistent patchy consolidation at the lung bases, unchanged. No effusion or pneumothorax. No acute bony abnormalities. IMPRESSION: 1. Stable support devices. 2. Persistent bibasilar patchy consolidation which could reflect atelectasis or pneumonia. Electronically Signed   By: Randa Ngo M.D.   On: 02/24/2020 18:07   DG CHEST PORT 1 VIEW  Result Date: 02/23/2020 CLINICAL DATA:  Encounter for central line placement EXAM: PORTABLE CHEST 1 VIEW COMPARISON:  02/21/2020 FINDINGS: Endotracheal tube approximately 2 cm above the carina. NG tube enters the stomach with the tip not visualized. Right jugular central venous catheter tip in the right atrium unchanged. New left jugular catheter with the tip in the proximal SVC.  No pneumothorax. Mild right lower lobe atelectasis. Negative for edema or effusion IMPRESSION: Left jugular central venous catheter tip in the SVC. No pneumothorax. Mild right lower lobe atelectasis. Endotracheal tube 2 cm above the carina. Electronically Signed   By: Franchot Gallo M.D.   On: 02/23/2020 15:14   DG Chest Port 1 View  Result Date: 02/20/2020 CLINICAL DATA:  COVID EXAM: PORTABLE CHEST 1 VIEW COMPARISON:  01/12/2020 FINDINGS: Interval extubation. Interval placement of large-bore right neck vascular catheter, tip position near the superior cavoatrial junction.  The heart size and mediastinal contours are within normal limits. Both lungs are clear. The visualized skeletal structures are unremarkable. IMPRESSION: 1. Interval placement of large-bore right neck vascular catheter, tip position near the superior cavoatrial junction. 2. No acute abnormality of the lungs in AP portable projection. Electronically Signed   By: Eddie Candle M.D.   On: 02/20/2020 13:43   DG Chest Port 1V same Day  Result Date: 03/02/2020 CLINICAL DATA:  Shortness of breath.  Altered mental status. EXAM: PORTABLE CHEST 1 VIEW COMPARISON:  Radiograph yesterday. FINDINGS: Enteric tube remains in place with tip below the diaphragm. Right internal jugular dialysis catheter unchanged. Improving cardiomegaly with stable mediastinal contours. Persistent but improving patchy bibasilar opacities. No pneumothorax. No large pleural effusion. Surgical hardware in the lower cervical spine is partially included. IMPRESSION: 1. Persistent but improving patchy bibasilar opacities, atelectasis versus pneumonia. 2. Improving cardiomegaly. Electronically Signed   By: Keith Rake M.D.   On: 03/02/2020 15:17   DG Chest Port 1V same Day  Result Date: 02/21/2020 CLINICAL DATA:  Shortness of breath, COVID EXAM: PORTABLE CHEST 1 VIEW COMPARISON:  02/20/2020 FINDINGS: Right dialysis catheter remains in place, unchanged. Heart is normal size.  No confluent airspace opacities or effusions. No acute bony abnormality. IMPRESSION: No acute cardiopulmonary disease. Electronically Signed   By: Rolm Baptise M.D.   On: 02/21/2020 13:03   DG Swallowing Func-Speech Pathology  Result Date: 03/03/2020 Objective Swallowing Evaluation: Type of Study: MBS-Modified Barium Swallow Study  Patient Details Name: Jabaree Mercado. MRN: [id number] Date of Birth: [birth year] Today's Date: 03/03/2020 Time: SLP Start Time (ACUTE ONLY): 8676 -SLP Stop Time (ACUTE ONLY): 1418 SLP Time Calculation (min) (ACUTE ONLY): 24 min Past Medical History: Past Medical History: Diagnosis Date . Anemia  . Arthritis   left knee . Chronic kidney disease   acute renal failure/injury requiring short-term HD 2013 . Colon cancer ([place])  . Colon polyp   11/2011 - Polyps identified, biopsy - invasive adenocarinoma . DM II (diabetes mellitus, type II), controlled ([place])   type 2 IDDM x 15 years. A1C 1/09 13.7.  Marland Kitchen 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 12/25/2011 . GERD (gastroesophageal reflux disease)  . Heart murmur  . Hx of ileostomy 10/13/2012 . Hyperlipidemia  . Hypertension   for a few years and resolved in 2013/2014. . Sleep apnea   does not wear CPAP now . Wears glasses  Past Surgical History: Past Surgical History: Procedure Laterality Date . APPLICATION OF WOUND VAC  1/95/0932  Procedure: APPLICATION OF WOUND VAC;  Surgeon: Zenovia Jarred, MD;  Location: [place];  Service: General;  Laterality: N/A; . APPLICATION OF WOUND VAC  6/71/2458  Procedure: APPLICATION OF WOUND VAC;  Surgeon: Imogene Burn. [name], MD;  Location: [place];  Service: General;  Laterality: N/A; . AV FISTULA PLACEMENT Left 07/29/2019  Procedure: ARTERIOVENOUS (AV) FISTULA CREATION;  Surgeon: Marty Heck, MD;  Location: [place];  Service: Vascular;  Laterality: Left; . COLONOSCOPY   . COLONOSCOPY  02/06/2012  Procedure: COLONOSCOPY;  Surgeon: Milus Banister, MD;  Location: [place];  Service: Endoscopy;; .  COLONOSCOPY WITH PROPOFOL N/A 06/10/2019  Procedure: COLONOSCOPY WITH PROPOFOL;  Surgeon: Milus Banister, MD;  Location: [facility];  Service: Endoscopy;  Laterality: N/A; . COLOSTOMY REVERSAL   . DRESSING CHANGE UNDER ANESTHESIA  02/18/2012  Procedure: DRESSING CHANGE UNDER ANESTHESIA;  Surgeon: Odis Hollingshead, MD;  Location: [place];  Service: General;  Laterality: N/A; . ESOPHAGOGASTRODUODENOSCOPY (EGD) WITH  PROPOFOL N/A 06/10/2019  Procedure: ESOPHAGOGASTRODUODENOSCOPY (EGD) WITH PROPOFOL;  Surgeon: Milus Banister, MD;  [facility];  Service: Endoscopy;  Laterality: N/A; . GANGLION CYST EXCISION  20 YRS AGO  RT ARM . ILEOSTOMY  02/16/2012  Procedure: ILEOSTOMY;  Surgeon: Zenovia Jarred, MD;  Location: [place];  Service: General;  Laterality: N/A; . ILEOSTOMY CLOSURE N/A 09/02/2012  Procedure: ILEOSTOMY REVERSAL;  Surgeon: Imogene Burn. [name], MD;  Location: [place];  Service: General;  Laterality: N/A; . ILIOSTOMY   . INCISION AND DRAINAGE OF WOUND  02/23/2012  Procedure: IRRIGATION AND DEBRIDEMENT WOUND;  Surgeon: Joyice Faster. Cornett, MD;  Location: [place];  Service: General;  Laterality: N/A; . IR DIALY SHUNT INTRO [place] W/IMG LEFT Left 01/21/2020 . IR FLUORO GUIDE CV LINE RIGHT  01/21/2020 . IR REMOVAL TUN CV CATH W/O FL  01/07/2020 . IR US GUIDE VASC ACCESS LEFT  01/21/2020 . IR US GUIDE VASC ACCESS RIGHT  01/21/2020 . LAPAROTOMY  02/16/2012  Procedure: EXPLORATORY LAPAROTOMY;  Surgeon: Zenovia Jarred, MD;  Location: [place];  Service: General;  Laterality: N/A;  Exploratory Laparotomy with resection of anastomosis . LAPAROTOMY  02/18/2012  Procedure: EXPLORATORY LAPAROTOMY;  Surgeon: Odis Hollingshead, MD;  Location: [place];  Service: General;  Laterality: N/A;  exploratory laparotomy  change of abdominal vac dressing . LAPAROTOMY  02/20/2012  Procedure: EXPLORATORY LAPAROTOMY;  Surgeon: Odis Hollingshead, MD;  Location: [place];  Service: General;  Laterality: N/A; . LAPAROTOMY  02/23/2012   Procedure: EXPLORATORY LAPAROTOMY;  Surgeon: Joyice Faster. Cornett, MD;  Location: [place];  Service: General;  Laterality: N/A;  Irrigation and Debridement of abdominal wound with wound vac change with partial closure . LAPAROTOMY  02/26/2012  Procedure: EXPLORATORY LAPAROTOMY;  Surgeon: Imogene Burn. [name], MD;  Location: [place];  Service: General;  Laterality: N/A;   . PARTIAL COLECTOMY  02/06/2012 . PARTIAL COLECTOMY  02/06/2012  Procedure: PARTIAL COLECTOMY;  Surgeon: Imogene Burn. [name], MD;  Location: [place];  Service: General;  Laterality: N/A;  right partial colectomy . POSTERIOR CERVICAL FUSION/FORAMINOTOMY N/A 01/06/2020  Procedure: CERVICAL FOUR- THORACIC TWO LAMINECTOMY AND FUSION;  Surgeon: Consuella Lose, MD;  Location: [place];  Service: Neurosurgery;  Laterality: N/A;  CERVICAL FOUR- THORACIC TWO LAMINECTOMY AND FUSION . RESECTION SMALL BOWEL / CLOSURE ILEOSTOMY  402/2014  Dr [name] . VACUUM ASSISTED CLOSURE CHANGE  02/20/2012  Procedure: ABDOMINAL VACUUM ASSISTED CLOSURE CHANGE;  Surgeon: Odis Hollingshead, MD;  Location: [place];  Service: General;; . VACUUM ASSISTED CLOSURE CHANGE  02/23/2012  Procedure: ABDOMINAL VACUUM ASSISTED CLOSURE CHANGE;  Surgeon: Joyice Faster. Cornett, MD;  Location: [place];  Service: General;  Laterality: N/A; HPI: Mamie Apelles Rayne Loiseau. is a 69 y.o. male with medical history significant of GERD, hypertension, hyperlipidemia ESRD on hemodialysis, type 2 diabetes mellitus, anemia of chronic disease, colon cancer status post resection, epidural abscess status post laminectomy, posterior cervical fusion on 01/06/2020 and MRSA bacteremia and presented emergently for evaluation of confusion and COVID-19 positive. Initial BSE 02/21/20 was limited- needed enouragement, audible swallow, eructation, poor dentition and reduced endurance, Work of breathing was stable. Dys 2/thin recommended. On 9/22 became unresponsive after HD, transferred to ICU and intubated then extubated 9/25. Currently has NGT,  encephalopathy improving per MD note and speech to re-eval. CXR Bilateral basilar infiltrates suggesting multifocal pneumonia.  No data recorded Assessment / Plan / Recommendation CHL IP CLINICAL IMPRESSIONS 03/03/2020 Clinical Impression Pt demonstrates mild orpharyngeal dysphagia mostly related to anatomical differences.  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 In addition, initiation of swallow is intermittently late with boluses reaching the pyriform sinuses for up to 5 seconds prior to swallow. His epiglottis at times does not fully invert adding to inadequate protection. Thin barium was penetrated before and during the swallow remaining superior to the vocal cords. His reflexive throat clear, present most of the time did not clear vestibule and a chin tuck was not effective. Penetration was present once with nectar at end of study. Nectar thick recommended for pt to initiate diet and upgrade to thin from clinical observation with SLP. Dys 2 texture recommended, crush pills, small sips/bites, intermittent throat clear and avoid straws.    SLP Visit Diagnosis Dysphagia, pharyngeal phase (R13.13) Attention and concentration deficit following -- Frontal lobe and executive function deficit following -- Impact on safety and function Moderate aspiration risk   CHL IP TREATMENT RECOMMENDATION 03/03/2020 Treatment Recommendations Therapy as outlined in treatment plan below   Prognosis 03/03/2020 Prognosis for Safe Diet Advancement Good Barriers to Reach Goals -- Barriers/Prognosis Comment -- CHL IP DIET RECOMMENDATION 03/03/2020 SLP Diet Recommendations Dysphagia 2 (Fine chop) solids;Nectar thick liquid Liquid Administration via Cup Medication Administration Crushed with puree Compensations Slow rate;Small sips/bites;Clear throat intermittently Postural Changes Seated upright at 90 degrees   CHL IP OTHER  RECOMMENDATIONS 03/03/2020 Recommended Consults -- Oral Care Recommendations Oral care BID Other Recommendations Order thickener from pharmacy   CHL IP FOLLOW UP RECOMMENDATIONS 03/03/2020 Follow up Recommendations (No Data)   CHL IP FREQUENCY AND DURATION 03/03/2020 Speech Therapy Frequency (ACUTE ONLY) min 2x/week Treatment Duration 2 weeks      CHL IP ORAL PHASE 03/03/2020 Oral Phase Impaired Oral - Pudding Teaspoon -- Oral - Pudding Cup -- Oral - Honey Teaspoon -- Oral - Honey Cup -- Oral - Nectar Teaspoon -- Oral - Nectar Cup WFL Oral - Nectar Straw -- Oral - Thin Teaspoon -- Oral - Thin Cup Decreased bolus cohesion Oral - Thin Straw -- Oral - Puree -- Oral - Mech Soft -- Oral - Regular Delayed oral transit Oral - Multi-Consistency -- Oral - Pill -- Oral Phase - Comment --  CHL IP PHARYNGEAL PHASE 03/03/2020 Pharyngeal Phase Impaired Pharyngeal- Pudding Teaspoon -- Pharyngeal -- Pharyngeal- Pudding Cup -- Pharyngeal -- Pharyngeal- Honey Teaspoon -- Pharyngeal -- Pharyngeal- Honey Cup -- Pharyngeal -- Pharyngeal- Nectar Teaspoon -- Pharyngeal -- Pharyngeal- Nectar Cup Delayed swallow initiation-pyriform sinuses;Reduced epiglottic inversion;Reduced airway/laryngeal closure;Penetration/Aspiration during swallow;Pharyngeal residue - valleculae Pharyngeal Material enters airway, remains ABOVE vocal cords and not ejected out Pharyngeal- Nectar Straw -- Pharyngeal -- Pharyngeal- Thin Teaspoon -- Pharyngeal -- Pharyngeal- Thin Cup Delayed swallow initiation-pyriform sinuses;Pharyngeal residue - valleculae;Pharyngeal residue - pyriform;Penetration/Aspiration before swallow;Reduced airway/laryngeal closure Pharyngeal Material enters airway, remains ABOVE vocal cords and not ejected out Pharyngeal- Thin Straw -- Pharyngeal -- Pharyngeal- Puree -- Pharyngeal -- Pharyngeal- Mechanical Soft -- Pharyngeal -- Pharyngeal- Regular WFL Pharyngeal -- Pharyngeal- Multi-consistency -- Pharyngeal -- Pharyngeal- Pill -- Pharyngeal --  Pharyngeal Comment --  CHL IP CERVICAL ESOPHAGEAL PHASE 03/03/2020 Cervical Esophageal Phase WFL Pudding Teaspoon -- Pudding Cup -- Honey Teaspoon -- Honey Cup -- Nectar Teaspoon -- Nectar Cup -- Nectar Straw -- Thin Teaspoon -- Thin Cup -- Thin Straw -- Puree -- Mechanical Soft -- Regular -- Multi-consistency -- Pill -- Cervical Esophageal Comment -- [place] Siren 03/03/2020, 4:35 PM Orbie Pyo Litaker M.Ed Risk analyst [phone] Office [phone]  VAS [place] LOWER EXTREMITY VENOUS (DVT)  Result Date: 02/23/2020  Lower Venous DVTStudy Indications: Elevated D dimer. Other Indications: + Covid. Risk Factors: None identified. Performing Technologist: Griffin Basil RCT RDMS  Examination Guidelines: A complete evaluation includes B-mode imaging, spectral Doppler, color Doppler, and power Doppler as needed of all accessible portions of each vessel. Bilateral testing is considered an integral part of a complete examination. Limited examinations for reoccurring indications may be performed as noted. The reflux portion of the exam is performed with the patient in reverse Trendelenburg.  +---------+---------------+---------+-----------+----------+--------------+ RIGHT    CompressibilityPhasicitySpontaneityPropertiesThrombus Aging +---------+---------------+---------+-----------+----------+--------------+ CFV      Full           Yes      Yes                                 +---------+---------------+---------+-----------+----------+--------------+ SFJ      Full                                                        +---------+---------------+---------+-----------+----------+--------------+ FV Prox  Full                                                        +---------+---------------+---------+-----------+----------+--------------+ FV Mid   Full                                                         +---------+---------------+---------+-----------+----------+--------------+ FV DistalFull                                                        +---------+---------------+---------+-----------+----------+--------------+ PFV      Full                                                        +---------+---------------+---------+-----------+----------+--------------+ POP      Full           Yes      Yes                                 +---------+---------------+---------+-----------+----------+--------------+ PTV      Full                                                        +---------+---------------+---------+-----------+----------+--------------+ PERO     Full                                                        +---------+---------------+---------+-----------+----------+--------------+   +---------+---------------+---------+-----------+----------+--------------+  LEFT     CompressibilityPhasicitySpontaneityPropertiesThrombus Aging +---------+---------------+---------+-----------+----------+--------------+ CFV      Full           Yes      Yes                                 +---------+---------------+---------+-----------+----------+--------------+ SFJ      Full                                                        +---------+---------------+---------+-----------+----------+--------------+ FV Prox  Full                                                        +---------+---------------+---------+-----------+----------+--------------+ FV Mid   Full                                                        +---------+---------------+---------+-----------+----------+--------------+ FV DistalFull                                                        +---------+---------------+---------+-----------+----------+--------------+ PFV      Full                                                         +---------+---------------+---------+-----------+----------+--------------+ POP      Full           Yes      Yes                                 +---------+---------------+---------+-----------+----------+--------------+ PTV      Full                                                        +---------+---------------+---------+-----------+----------+--------------+ PERO     Full                                                        +---------+---------------+---------+-----------+----------+--------------+     Summary: RIGHT: - There is no evidence of deep vein thrombosis in the lower extremity.  - No cystic structure found in the popliteal fossa.  LEFT: - There is no evidence of deep vein thrombosis in the lower extremity.  - No  cystic structure found in the popliteal fossa.  *See table(s) above for measurements and observations. Electronically signed by Harold Barban MD on 02/23/2020 at 9:31:43 PM.    Final    US Abdomen Limited RUQ  Result Date: 02/23/2020 CLINICAL DATA:  Transaminitis EXAM: ULTRASOUND ABDOMEN LIMITED RIGHT UPPER QUADRANT COMPARISON:  None. FINDINGS: Gallbladder: The gallbladder was not visualized and may be surgically absent Common bile duct: Diameter: 3 mm Liver: The liver parenchyma is coarsened and heterogeneous. The liver surface appears somewhat nodular there is no discrete hepatic mass. There is suggestion of bidirectional flow within the portal vein Other: The right kidney appears atrophic and echogenic. There are multiple right-sided renal cysts measuring up to approximately 3.6 cm. IMPRESSION: 1. Gallbladder not visualized. 2. Coarsened and heterogeneous appearance of the liver parenchyma with findings suspicious for underlying cirrhosis. 3. Borderline bidirectional flow within the main portal vein is suggestive of portal hypertension. 4. Echogenic and atrophic appearing right kidney consistent with the patient's history of renal failure. Electronically Signed    By: Constance Holster M.D.   On: 02/23/2020 19:21

## 2020-03-08 NOTE — Progress Notes (Signed)
Pt refused to allow CBG

## 2020-03-08 NOTE — Progress Notes (Signed)
Pt has 2hrs left to complete tx, but wants to come off tx. Educated on the importance of staying on tx to get the 2 Units of blood. Pt said he will stop it himself. Nephrologist notified and saw the pt. Nephrologist ordered to terminate HD tx.

## 2020-03-08 NOTE — Progress Notes (Signed)
1 unit of RBC started, no adverse reaction noted post 66mins. Patient a/o/v. Will continue to monitor.

## 2020-03-08 NOTE — Progress Notes (Signed)
Pt refused BG test per NT and RBC transfusion. Attempted education but refused.   Made a call to his daughter Joseph Art who was able to speak with her father. He is still declining BG check and RBC transfusion regardless of conversation with daughter. Education attempted though further education may be needed as he did not seem interested. Will continue to monitor.

## 2020-03-08 NOTE — Progress Notes (Signed)
CRITICAL VALUE ALERT  Critical Value:  Hgb 5.9  Date & Time Notied:  03/08/20 @ 3968  Provider Notified: Dr. Maryland Pink via page  Orders Received/Actions taken: 2 Units of RBC ordered by nephrology

## 2020-03-08 NOTE — Progress Notes (Signed)
VAST RN to bedside to assess and access HD catheter per physician order. Unit RN obtained Blood administration tubing and IV pump while VAST RN pulled out heparin and waste from blue lumen of right chest [place] per protocol (4 mLs); flushed lumen with 10 mLs NS prior to connecting to NS infusion at 62mLs/hr. Educated unit RN to place IVT consult once blood transfusion completed. Also advised if she had any trouble with line beeping occluded, to contact IVT immediately as we don't want line clotting off. Unit RN, Lanice Shirts advised she was previously a dialysis nurse and is familiar with [facility].

## 2020-03-09 ENCOUNTER — Inpatient Hospital Stay (HOSPITAL_COMMUNITY): Payer: No Typology Code available for payment source

## 2020-03-09 DIAGNOSIS — G9341 Metabolic encephalopathy: Secondary | ICD-10-CM | POA: Diagnosis not present

## 2020-03-09 DIAGNOSIS — D696 Thrombocytopenia, unspecified: Secondary | ICD-10-CM | POA: Diagnosis not present

## 2020-03-09 DIAGNOSIS — D638 Anemia in other chronic diseases classified elsewhere: Secondary | ICD-10-CM | POA: Diagnosis not present

## 2020-03-09 LAB — GLUCOSE, CAPILLARY
Glucose-Capillary: 97 mg/dL (ref 70–99)
Glucose-Capillary: 69 mg/dL — ABNORMAL LOW (ref 70–99)
Glucose-Capillary: 87 mg/dL (ref 70–99)
Glucose-Capillary: 160 mg/dL — ABNORMAL HIGH (ref 70–99)
Glucose-Capillary: 110 mg/dL — ABNORMAL HIGH (ref 70–99)

## 2020-03-09 LAB — CBC
HCT: 26.7 % — ABNORMAL LOW (ref 39.0–52.0)
Hemoglobin: 8.8 g/dL — ABNORMAL LOW (ref 13.0–17.0)
MCH: 27.9 pg (ref 26.0–34.0)
MCHC: 33 g/dL (ref 30.0–36.0)
MCV: 84.8 fL (ref 80.0–100.0)
Platelets: 150 10*3/uL (ref 150–400)
RBC: 3.15 MIL/uL — ABNORMAL LOW (ref 4.22–5.81)
RDW: 19.7 % — ABNORMAL HIGH (ref 11.5–15.5)
WBC: 11.5 10*3/uL — ABNORMAL HIGH (ref 4.0–10.5)
nRBC: 0 % (ref 0.0–0.2)

## 2020-03-09 IMAGING — CT CT HEAD W/O CM
4 series · 15 of 47 positions shown, 17 images · non-contrast
Comparison: [DATE].

CLINICAL DATA: Status post fall.

EXAM:
CT HEAD WITHOUT CONTRAST
CT CERVICAL SPINE WITHOUT CONTRAST
TECHNIQUE: Multidetector CT imaging of the head and cervical spine was
performed following the standard protocol without intravenous
contrast. Multiplanar CT image reconstructions of the cervical spine
were also generated.

[Series 3: head without · axial · non-contrast · 0.44mm/px · z∈[-185,-60]mm · 7 of 35 slices shown, 9 images]
[im 5/35  brain]
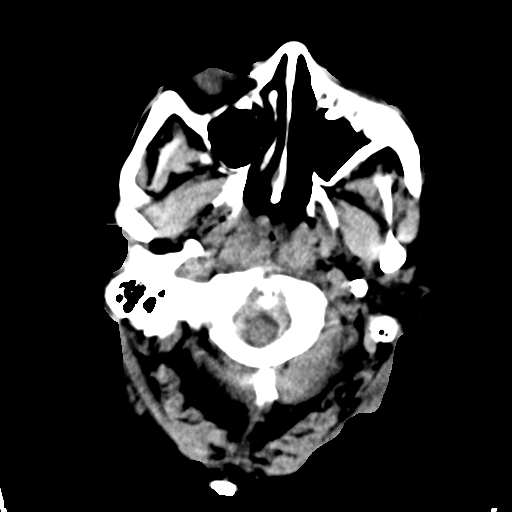
[im 5/35  bone]
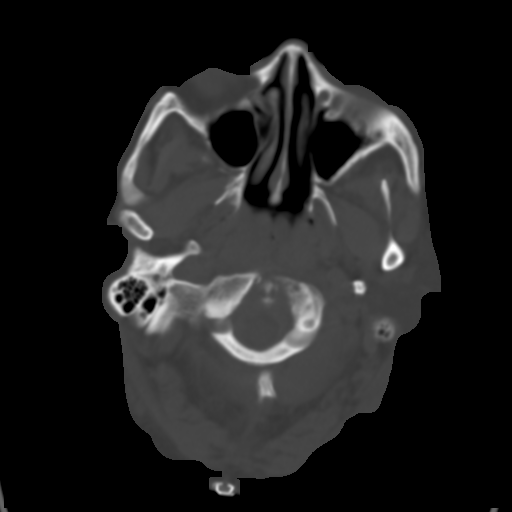
[im 9/35  brain]
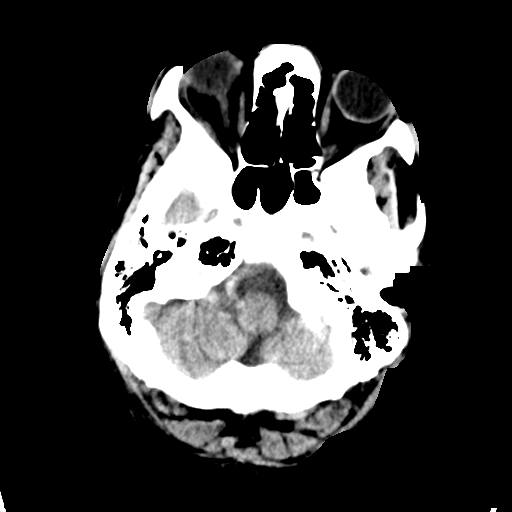
[im 13/35  brain]
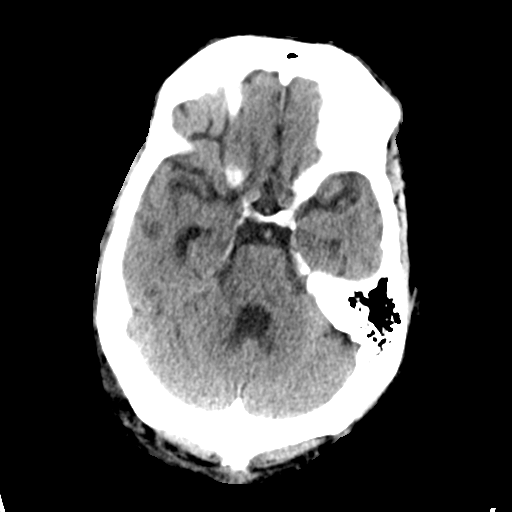
[im 18/35  brain]
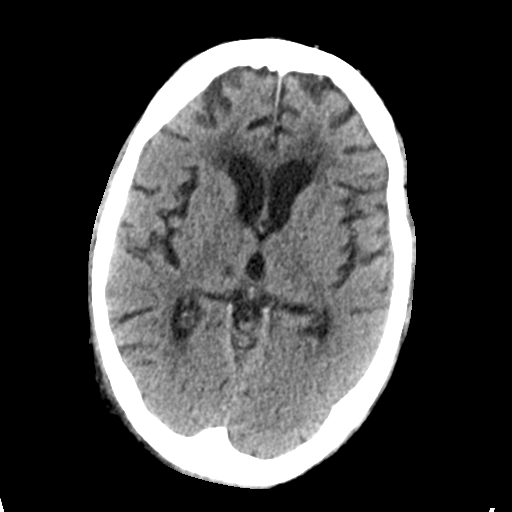
[im 22/35  brain]
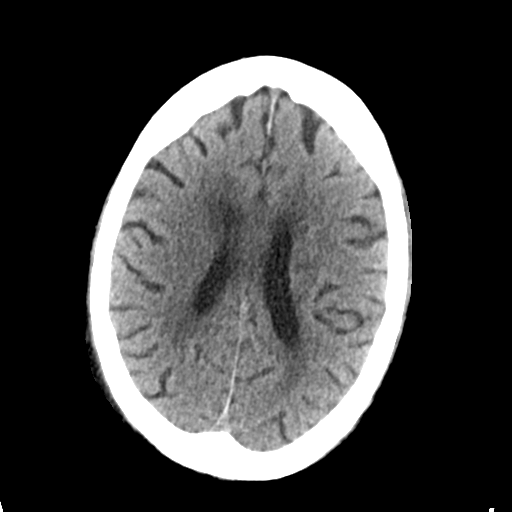
[im 22/35  bone]
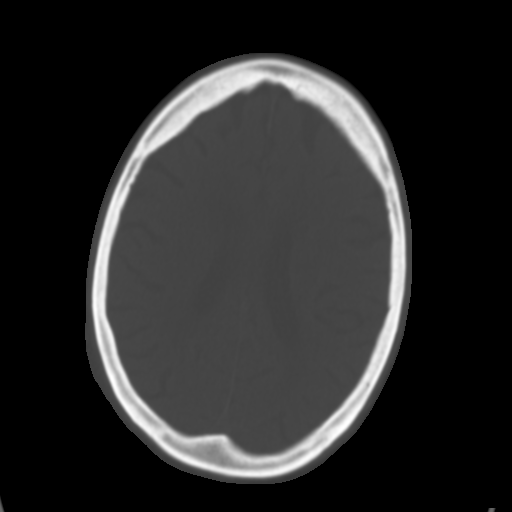
[im 26/35  brain]
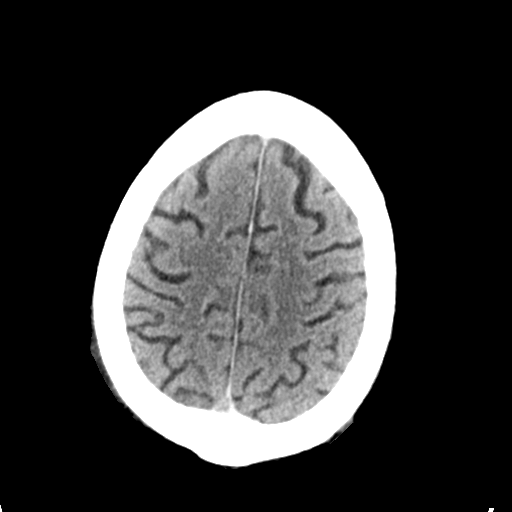
[im 30/35  brain]
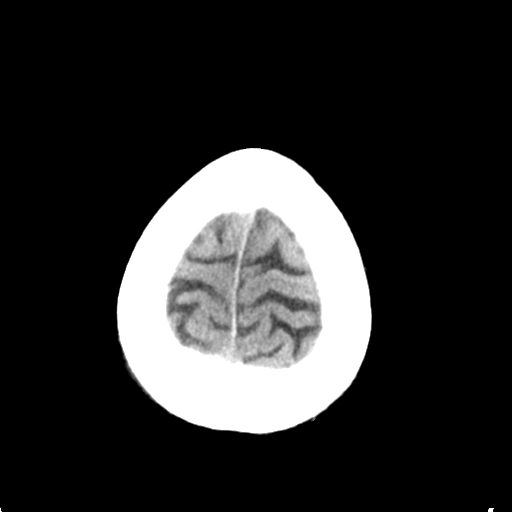

[Series 5: head bone · axial · 0.44mm/px · z∈[-189,-171]mm · 2 of 87 slices shown]
[im 9/87  bone]
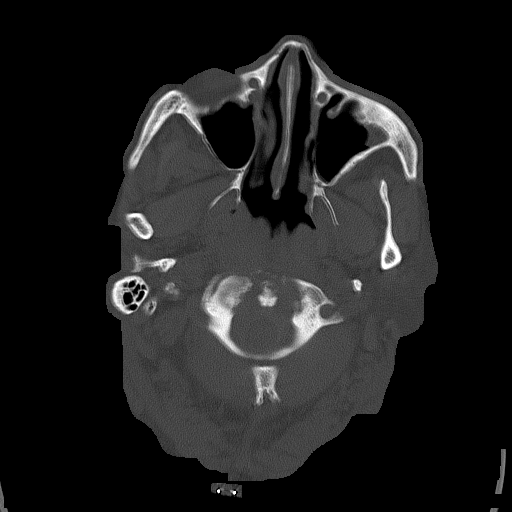
[im 18/87  bone]
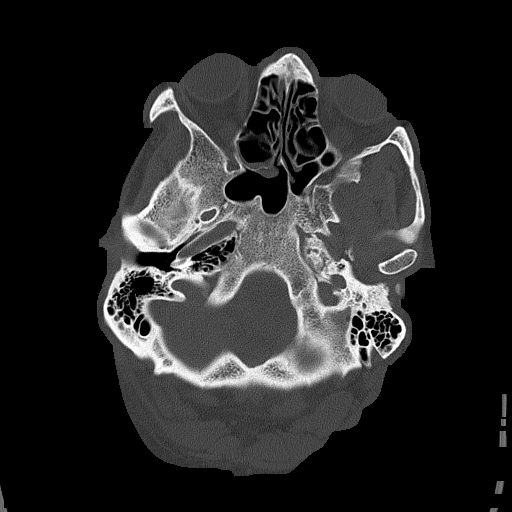

[Series 6: head without cor · coronal · non-contrast · 0.38mm/px · 3 of 76 slices shown]
[im 26/76  brain]
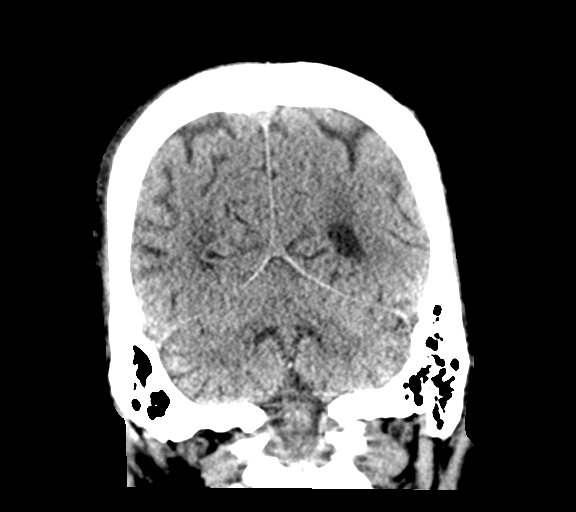
[im 34/76  brain]
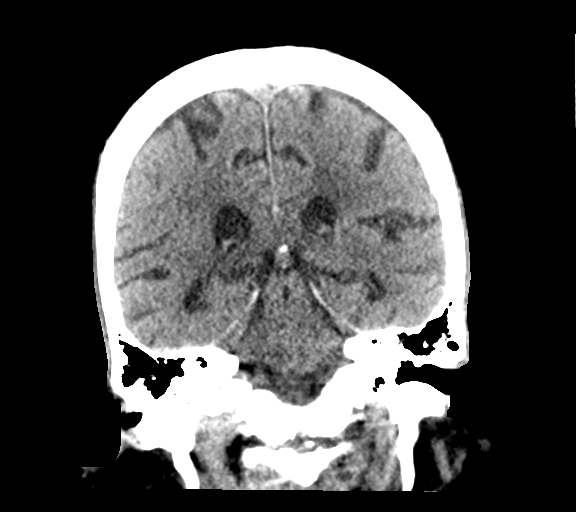
[im 42/76  brain]
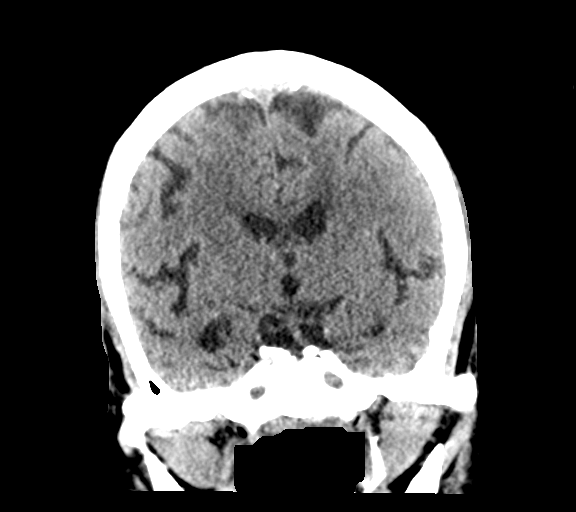

[Series 7: head without sag · sagittal · non-contrast · 0.37mm/px · 3 of 67 slices shown]
[im 23/67  brain]
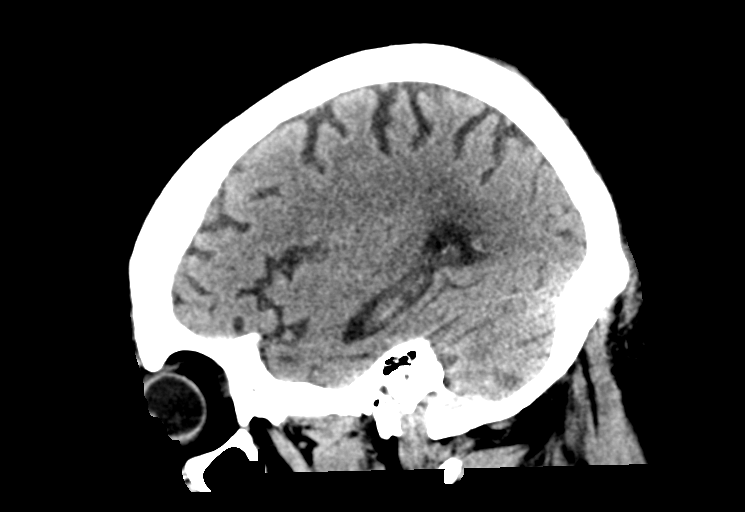
[im 34/67  brain]
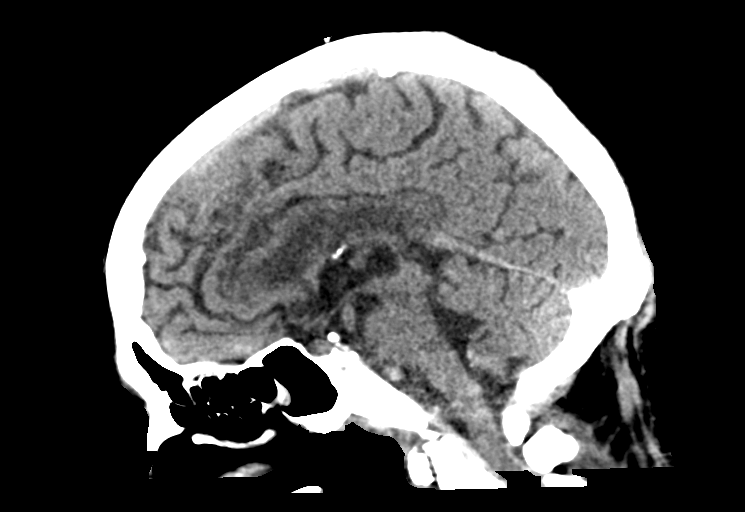
[im 45/67  brain]
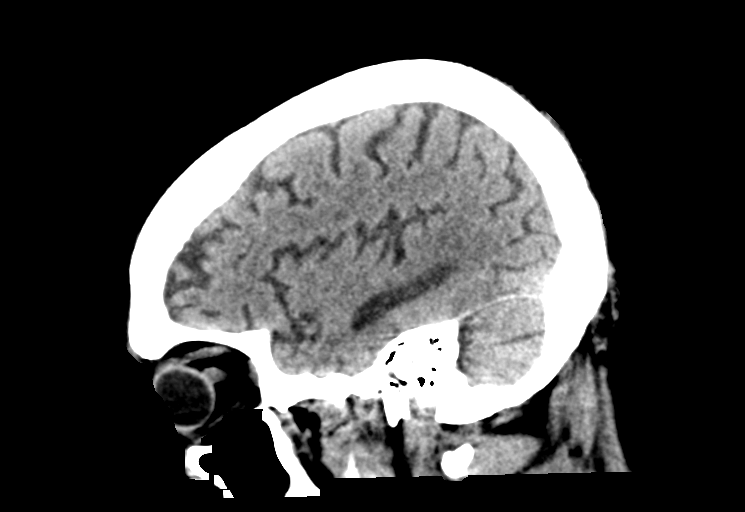

[15 of 47 positions shown; findings below may reference images not displayed]

FINDINGS: CT HEAD FINDINGS

Brain: Mild chronic ischemic white matter disease. No mass effect or
midline shift is noted. Ventricular size is within normal limits.
There is no evidence of mass lesion, hemorrhage or acute infarction.

Vascular: No hyperdense vessel or unexpected calcification.

Skull: Normal. Negative for fracture or focal lesion.

Sinuses/Orbits: No acute finding.

Other: None.

CT CERVICAL SPINE FINDINGS

Alignment: Normal.

Skull base and vertebrae: Stable erosive changes are seen involving
the dens and anterior arch of C1 most consistent with degenerative
change. Status post laminectomy extending from C4 to T1. Status post
surgical posterior fusion extending from C4 to T1 with bilateral
intrapedicular screw placement at all levels except C7.

Soft tissues and spinal canal: No prevertebral fluid or swelling. No
visible canal hematoma.

Disc levels: Severe degenerative disc disease is noted at C6-7 and
C7-T1.

Upper chest: Some degree of pleural effusion is seen involving the
right lung apex.

Other: None.
IMPRESSION: 1. Mild chronic ischemic white matter disease. No acute intracranial
abnormality seen.
2. Postsurgical and degenerative changes as described above. No
acute abnormality seen in the cervical spine.

## 2020-03-09 IMAGING — CT CT CERVICAL SPINE W/O CM
4 series · 15 of 33 positions shown, 18 images · non-contrast
Comparison: [DATE].

CLINICAL DATA: Status post fall.

EXAM:
CT HEAD WITHOUT CONTRAST
CT CERVICAL SPINE WITHOUT CONTRAST
TECHNIQUE: Multidetector CT imaging of the head and cervical spine was
performed following the standard protocol without intravenous
contrast. Multiplanar CT image reconstructions of the cervical spine
were also generated.

[Series 4: c_spine 2.0 st · axial · 0.40mm/px · z∈[-326,-194]mm · 5 of 100 slices shown, 7 images]
[im 17/100  soft-tissue]
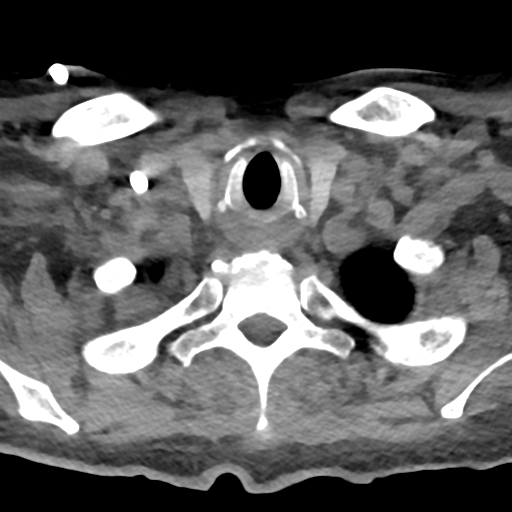
[im 17/100  bone]
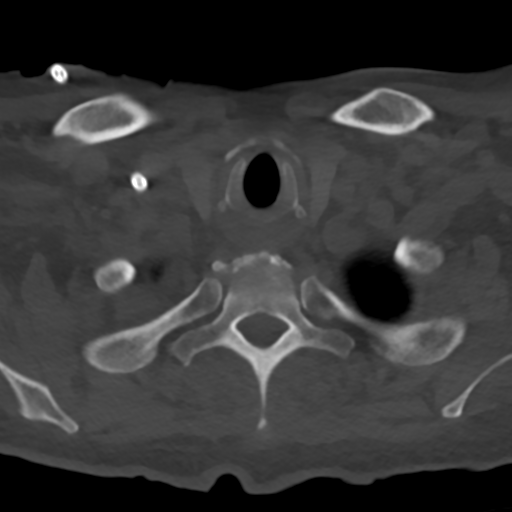
[im 34/100  bone]
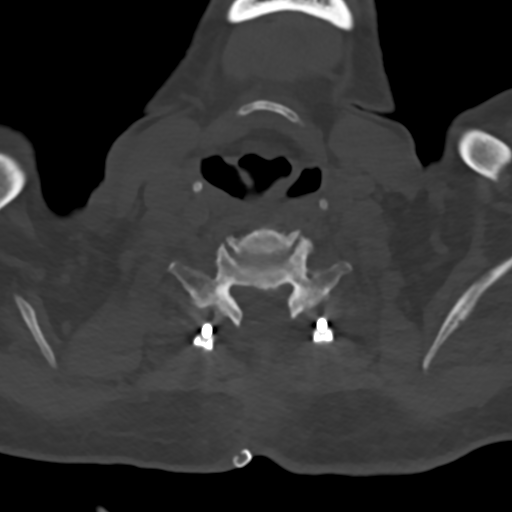
[im 50/100  bone]
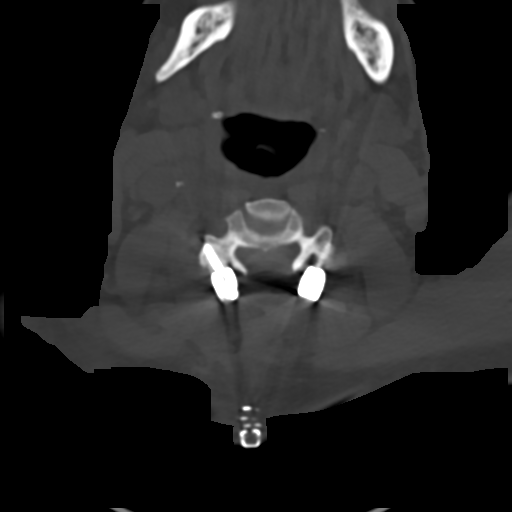
[im 67/100  bone]
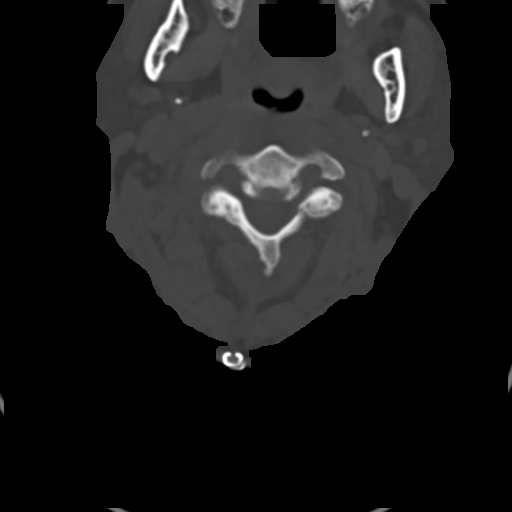
[im 83/100  soft-tissue]
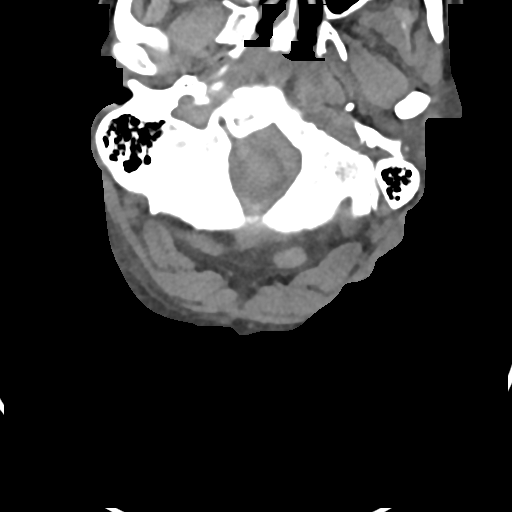
[im 83/100  bone]
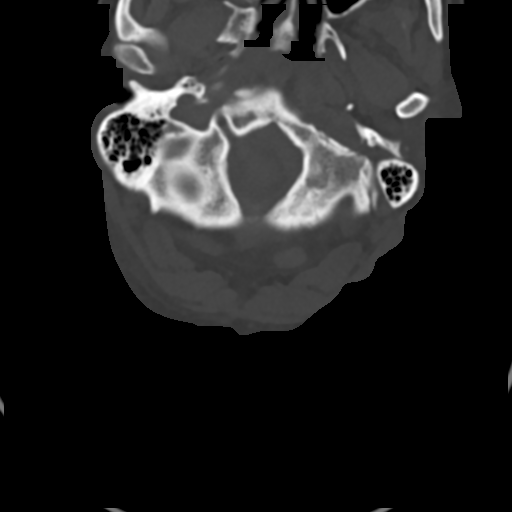

[Series 6: c_spine 2.0 sag bone · sagittal · 0.39mm/px · 5 of 86 slices shown, 6 images]
[im 29/86  bone]
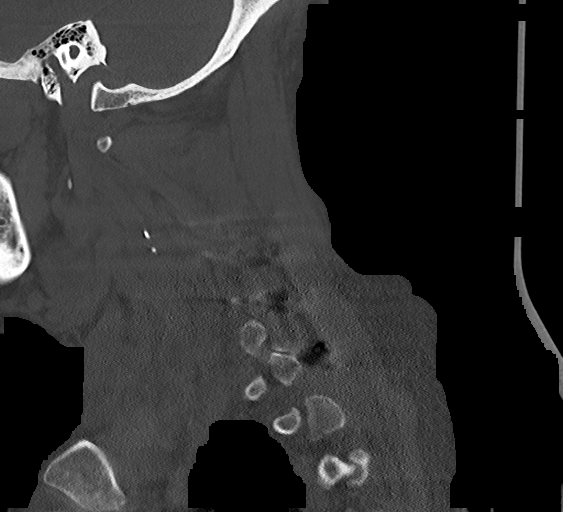
[im 36/86  bone]
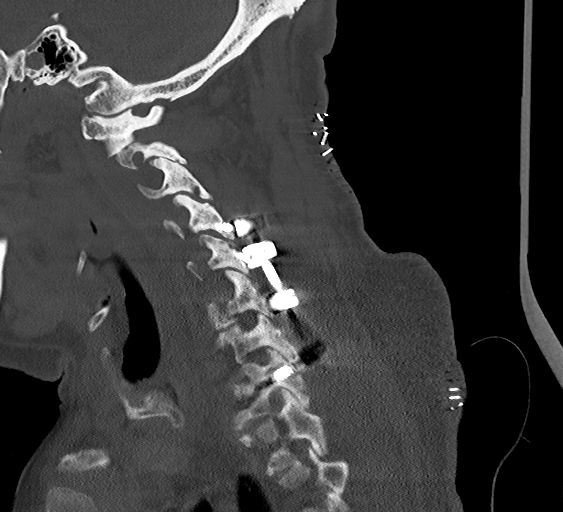
[im 43/86  soft-tissue]
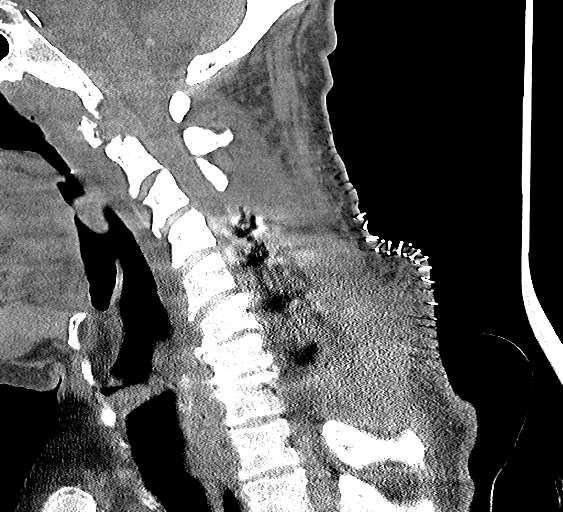
[im 43/86  bone]
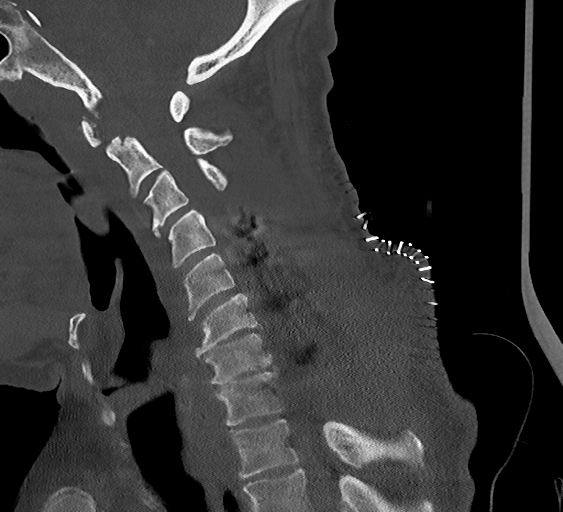
[im 50/86  bone]
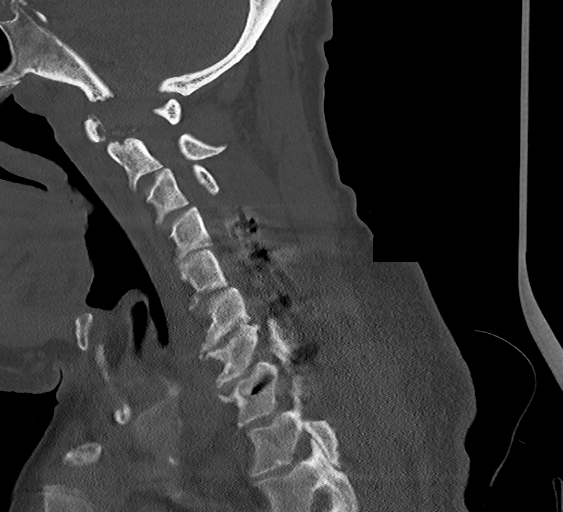
[im 57/86  bone]
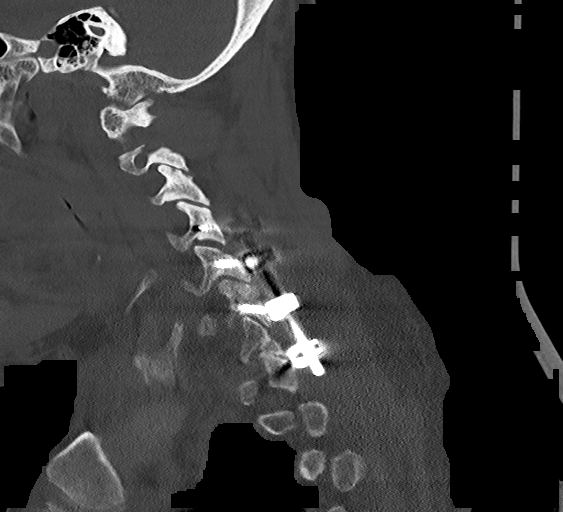

[Series 7: c_spine 2.0 cor bone · coronal · 0.38mm/px · 3 of 103 slices shown]
[im 21/103  bone]
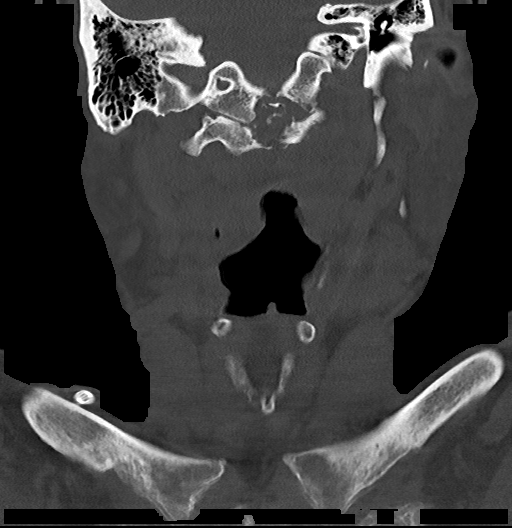
[im 41/103  bone]
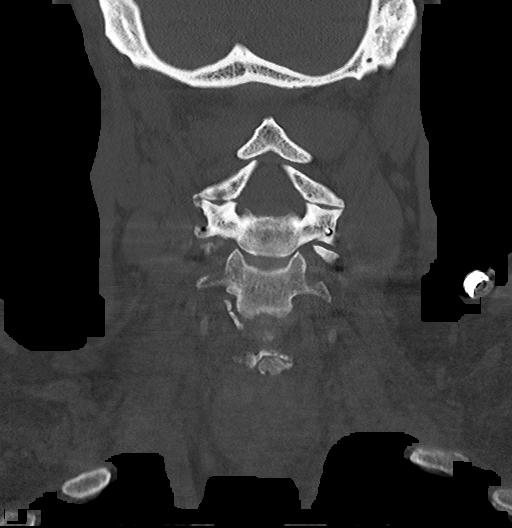
[im 62/103  bone]
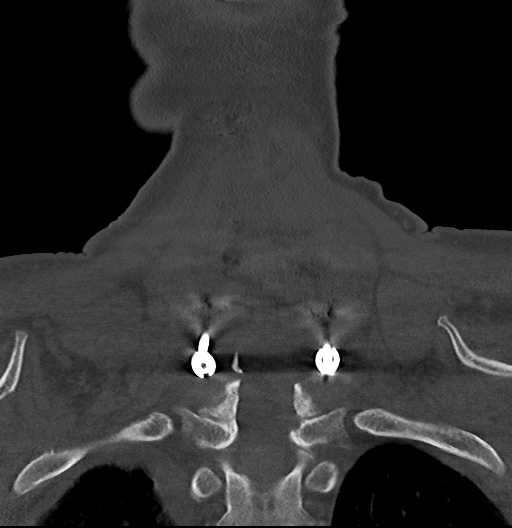

[Series 10: c_spine 2.0 orthogonals · axial · 0.26mm/px · z∈[-343,-318]mm · 2 of 99 slices shown]
[im 17/99  bone]
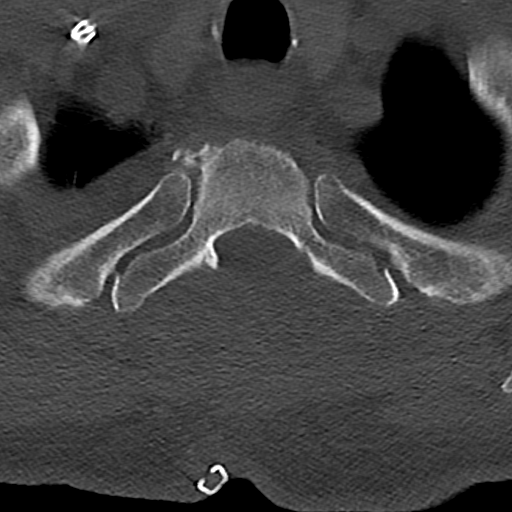
[im 33/99  bone]
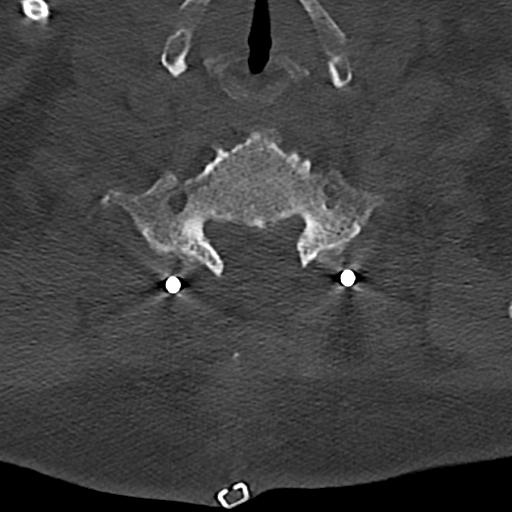

[15 of 33 positions shown; findings below may reference images not displayed]

FINDINGS: CT HEAD FINDINGS

Brain: Mild chronic ischemic white matter disease. No mass effect or
midline shift is noted. Ventricular size is within normal limits.
There is no evidence of mass lesion, hemorrhage or acute infarction.

Vascular: No hyperdense vessel or unexpected calcification.

Skull: Normal. Negative for fracture or focal lesion.

Sinuses/Orbits: No acute finding.

Other: None.

CT CERVICAL SPINE FINDINGS

Alignment: Normal.

Skull base and vertebrae: Stable erosive changes are seen involving
the dens and anterior arch of C1 most consistent with degenerative
change. Status post laminectomy extending from C4 to T1. Status post
surgical posterior fusion extending from C4 to T1 with bilateral
intrapedicular screw placement at all levels except C7.

Soft tissues and spinal canal: No prevertebral fluid or swelling. No
visible canal hematoma.

Disc levels: Severe degenerative disc disease is noted at C6-7 and
C7-T1.

Upper chest: Some degree of pleural effusion is seen involving the
right lung apex.

Other: None.
IMPRESSION: 1. Mild chronic ischemic white matter disease. No acute intracranial
abnormality seen.
2. Postsurgical and degenerative changes as described above. No
acute abnormality seen in the cervical spine.

## 2020-03-09 NOTE — Progress Notes (Signed)
PROGRESS NOTE                                                                                                                                                                                                             Patient Demographics:    [name], is a 69 y.o. male, DOB - [birth year], CWU:[id number]  Outpatient Primary MD for the patient is [facility]   Admit date - 02/20/2020   LOS - 41  Chief Complaint  Patient presents with  . Covid Positive  . AMS       Brief Narrative: Patient is a 69 y.o. male with PMHx of ESRD on HD MWF, DM-2, MRSA bacteremia with epidural abscess s/p C4-T2 laminectomy/fusion on 01/06/2020-on IV vancomycin/rifampin through 10/1-resident of SNF-apparently tested positive for COVID-19 on 9/18-subsequently brought to the ED on 9/19 for worsening confusion (more than baseline)-fever-thought to have acute metabolic encephalopathy from COVID-19 infection and admitted to the hospitalist service.  Further hospital course complicated by development of hypotension/severe encephalopathy requiring intubation and ICU transfer.  See below for further details.  COVID-19 vaccinated status: Unvaccinated  Significant Events: 8/3-8/23>> hospitalized for epidural abscess/MRSA bacteremia-underwent laminectomy-briefly intubated due to altered mental status.  Discharged to SNF 9/18>> COVID-19 positive at SNF 9/19>> Admit to [facility] for fever/confusion 9/22>> obtunded/hypotensive after coming back from hemodialysis-emergently intubated/transferred to ICU 9/30>> transferred back to [facility] 9/30>> SQ heparin stopped-pharmacy consulted for HIT protocol.  Significant studies: 8/6>> Echo: EF> 75% 9/19>>Chest x-ray: No acute abnormality in the lung. 9/19>> CT head: No acute intracranial abnormality 9/19>> CT C-spine: Postoperative findings of posterior laminectomy/fusion C4-T1, prevertebral soft tissue edema  appears reduced, postoperative fluid collection overlies laminectomy site does not appear significantly changed compared to prior examination. 9/20>> MRI C-spine: Attempted but due to AMS-not completed 9/21>> MRI C Spine: Osteomyelitis of skull base, anterior C1 and odontoid, C2-C3 facet joint-associated erosion of bone since 8/27-resolved epidural abscess 9/22>> coarsened/heterogeneous appearance of the liver parenchyma-suspicious for cirrhosis, gallbladder not visualized. 9/22>> bilateral lower extremity Doppler: No DVT 9/29>> chest x-ray: Bibasilar infiltrates suggesting multifocal pneumonia  COVID-19 medications: Remdesivir: 9/19>> 9/21  Antibiotics: IV vancomycin plan to continue through October 1 (rifampin stopped 9/21 due to elevated LFTs) Ancef: 9/26>> Zosyn: 9/24>> 9/26  Microbiology data: 9/20 >>blood culture: No growth so far 9/21>> blood culture: No growth so far 9/24>> sputum  culture: E. coli/MSSA  Procedures: ET tube 9/22 > 9/25  Consults: Nephrology PCCM GI  DVT prophylaxis: heparin injection 5,000 Units Start: 02/20/20>> stopped on 9/30 due to worsening thrombocytopenia. SCDs Start: 02/20/20 1628   Subjective:   Called by the nursing staff earlier this morning that the patient apparently tripped out of his bed and fell onto the pad on the floor.  Patient was seen at bedside.  He denies any pain.  Specifically no headache or neck pain.  Denies any pain in his extremities.  He was found to have excoriation in the back of his head.  No active bleeding is noted.  He has been refusing blood draws, CBGs etc. as before.   Assessment  & Plan :   Acute hypoxic respiratory failure secondary to HCAP (E. coli/MSSA in sputum culture) and COVID-19 pneumonia Initially admitted and thought to have mild disease. Started on steroid/Remdesivir. But decompensated on 9/22 requiring intubation and ICU transfer.  Extubated 9/26.  Found to have MSSA in the sputum culture.  Given  intravenous cefazolin till 10/1. Remdesivir stopped on 9/22 due to significantly elevated LFTs.    Respiratory status has been stable.  He is saturating normally on room air.  Tested positive on 9/19.  He can end isolation on 10/10.  Fall Patient is awake alert.  He is able to move all his extremities.  No obvious injuries noted.  However since he has been distracted and uncooperative for the last few days we will go ahead and image his head and his neck.  CT scan has been ordered.  Acute metabolic encephalopathy Multifactorial etiology-on initial presentation to the hospital was felt to be due to uremia (due to under dialysis, patient got short outpatient HD numerous times), sepsis physiology from COVID-19. However his mental status markedly deteriorated on 9/22 likely due to worsening sepsis physiology from HCAP.  Post extubation has had issues with delirium/likely related to intubation/ICU stay.  Required Precedex while in the ICU.  Patient mentation has improved but he started refusing treatments and medications and blood draws a few days ago.  Does not give a reason for the same.  Psychiatry was consulted.  Dose of Seroquel was increased.  Continue to wait on psychiatry input.  Some technical issues with using eye pads.  He also had his daughter talked with the patient.  Tele Sitter has been ordered considering the fall this morning.  He apparently has short-term memory issues. There was some thought as to whether patient has underlying dementia. Has a history of alcohol/cocaine use in the past.  Add thiamine.  Dysphagia He was getting tube feedings however the tube came out on 10/3.  He was started on a dysphagia diet.  Speech therapy has been following.   Continue to monitor for aspiration.  Elevated D-dimer Secondary to COVID-19.  He did not have any significant hypoxia.  Lower extremity Dopplers were negative.  Since he has clinically improved will not pursue any further work-up.  D-dimer  was 6.16 on October 1.  Clinically has improved.  No need to check it for now.  History of MRSA bacteremia with epidural abscess requiring C4-T2 laminectomy:  MRI C-spine completed on 9/21. Appears to have residual osteomyelitis of the skull base area. Was discussed with neurosurgeon Dr. Kathyrn Sheriff on 9/21. No further surgical intervention required.  Plans were to continue vancomycin/rifampin through October 1-however rifampin discontinued due to significantly elevated LFTs.  Patient now off of vancomycin.  Started on doxycycline from 10/1.  Patient  will need follow-up with infectious disease clinic post discharge.  Septic shock, resolved Required pressors while in the ICU. Thought to be most likely secondary to HCAP although patient has a history of recent MRSA bacteremia/cervical spine osteomyelitis/abscess.   Thrombocytopenia/HIT has been ruled out Suspect this is multifactorial in etiology from acute illness/sepsis/thrombocytopenia-potentially some contribution from underlying liver cirrhosis-however significant drop from what he first presented with-hence HIT was in the differential Subcutaneous heparin was discontinued.  HIT panel was ordered..  Patient started on bivalirudin in the interim.   Patient has been refusing blood draws.  IV access was lost.  He refused another IV access.  Bivalirudin was discontinued since patient was not being cooperative with IV access and blood draws.   The heparin-induced platelet antibody is 0.60.  Slightly above the normal range.  Serotonin release assay is normal. Patient's 4 T score was 6.  The heparin-induced antibody is 0.6 as discussed above.  With serotonin assay being normal this is unlikely to be HIT.  Platelet counts have improved to 127,000.  Normocytic Anemia Secondary to ESRD. Worsened by acute illness-s/p 1 unit of PRBC on 9/29.  Hemoglobin noted to be 5.9 yesterday..  2 units of PRBC ordered however patient cut short his dialysis treatment.  He  was refusing another IV access placement.  Subsequently the dialysis catheter was accessed and blood was transfused through it yesterday.  CBC is pending from this morning.   Shock liver LFTs have normalized.    History of HCV/liver cirrhosis HCV antibody positive in the past.  Viral load was done and no hepatitis C quantitative was detected.   Liver ultrasound with suggestion of cirrhotic appearance.  Will need outpatient GI follow-up.  DM-2 (A1c 6.5 on 8/11):  Appears to have brittle diabetes resulting in hypoglycemic episodes while in the ICU.  Avoid tight glycemic control.  Dose of Levemir was decreased.  CBGs noted to be borderline low.  Discontinue the Levemir to avoid hypoglycemia.    ESRD on HD MWF Nephrology following.  Patient being dialyzed as per his usual schedule.  Patient has been curtailing his dialysis treatment.  Has not been cooperative.  Sacral wound/loose stools Wound care is following for sacral wounds.  Currently has a Flexi-Seal.  Mainly present to protect the wounds that he has in the lower back area.   Imodium as needed.  Hopefully we can discontinue the Flexi-Seal if his stool output decreases.    Metabolic acidosis Likely secondary to incomplete HD treatments. Resolved with regular HD sessions.  Hyperkalemia Likely secondary to incomplete dialysis treatments, resolved with hemodialysis  Essential hypertension  Tolerate higher blood pressure in this patient who undergoes dialysis.  Continue to monitor.    Hyperlipidemia Statin on hold-should be able to resume when closer to discharge.  Questionable dementia Discussed with patient's daughter.  She is not certain if patient truly has cognitive impairment or dementia.  Psychiatry consulted.  Deconditioning/debility: Secondary to acute illness on top of chronic debility.  PT eval completed-SNF on discharge.  GI prophylaxis: PPI  Condition - Stable  Family Communication  :  Daughter [name] [phone])  Code Status :  Full Code  Diet :  Diet Order            DIET DYS 2 Room service appropriate? No; Fluid consistency: Nectar Thick  Diet effective now                  Disposition Plan  : Plan is for  the patient to go to skilled nursing facility when medically improved.  Status is: Inpatient  Remains inpatient appropriate because:Inpatient level of care appropriate due to severity of illness  Dispo: The patient is from: SNF              Anticipated d/c is to: SNF              Anticipated d/c date is: Hopefully by the end of this week              Patient currently is not medically stable to d/c.    Antimicorbials  :    Anti-infectives (From admission, onward)   Start     Dose/Rate Route Frequency Ordered Stop   03/06/20 2000  doxycycline (VIBRA-TABS) tablet 100 mg        100 mg Oral Every 12 hours 03/06/20 1632     03/04/20 1000  doxycycline (VIBRA-TABS) tablet 100 mg  Status:  Discontinued        100 mg Oral Every 12 hours 03/01/20 0944 03/04/20 0741   03/04/20 1000  doxycycline (VIBRA-TABS) tablet 100 mg  Status:  Discontinued        100 mg Per Tube Every 12 hours 03/04/20 0741 03/06/20 1632   02/27/20 1500  ceFAZolin (ANCEF) IVPB 1 g/50 mL premix        1 g 100 mL/hr over 30 Minutes Intravenous Every 24 hours 02/27/20 1433 03/02/20 1445   02/25/20 1000  piperacillin-tazobactam (ZOSYN) IVPB 2.25 g  Status:  Discontinued        2.25 g 100 mL/hr over 30 Minutes Intravenous Every 8 hours 02/25/20 0905 02/27/20 1433   02/25/20 0830  piperacillin-tazo (ZOSYN) NICU IV syringe 225 mg/mL  Status:  Discontinued        100 mg/kg of piperacillin  84.3 kg 84.4 mL/hr over 30 Minutes Intravenous Every 8 hours 02/25/20 0825 02/25/20 0905   02/25/20 0814  vancomycin (VANCOCIN) 1-5 GM/200ML-% IVPB       Note to Pharmacy: Cherylann Banas   : cabinet override      02/25/20 0814 02/25/20 1158   02/23/20 0642  vancomycin (VANCOCIN) 1-5 GM/200ML-% IVPB       Note to Pharmacy:  [place], [place] : cabinet override      02/23/20 0642 02/23/20 1859   02/21/20 1200  vancomycin (VANCOCIN) IVPB 1000 mg/200 mL premix        1,000 mg 200 mL/hr over 60 Minutes Intravenous Every M-W-F (Hemodialysis) 02/20/20 1959 03/03/20 1130   02/21/20 1000  remdesivir 100 mg in sodium chloride 0.9 % 100 mL IVPB  Status:  Discontinued       "Followed by" Linked Group Details   100 mg 200 mL/hr over 30 Minutes Intravenous Daily 02/20/20 1632 02/23/20 0633   02/20/20 2000  rifampin (RIFADIN) capsule 300 mg  Status:  Discontinued        300 mg Oral Every 12 hours 02/20/20 1907 02/23/20 0633   02/20/20 1730  remdesivir 200 mg in sodium chloride 0.9% 250 mL IVPB       "Followed by" Linked Group Details   200 mg 580 mL/hr over 30 Minutes Intravenous Once 02/20/20 1632 02/20/20 1913      Inpatient Medications  Scheduled Meds: . sodium chloride   Intravenous Once  . sodium chloride   Intravenous Once  . calcitRIOL  1.5 mcg Oral Q M,W,F-HD  . chlorhexidine  15 mL Mouth Rinse BID  . Chlorhexidine Gluconate Cloth  6 each Topical  [id number]  . collagenase   Topical Daily  . darbepoetin (ARANESP) injection - DIALYSIS  150 mcg Intravenous Q Wed-HD  . doxycycline  100 mg Oral Q12H  . feeding supplement (NEPRO CARB STEADY)  237 mL Oral BID BM  . insulin aspart  0-5 Units Subcutaneous QHS  . insulin aspart  0-9 Units Subcutaneous TID WC  . insulin detemir  5 Units Subcutaneous Daily  . mouth rinse  15 mL Mouth Rinse q12n4p  . multivitamin  1 tablet Oral QHS  . pantoprazole  40 mg Oral Daily  . QUEtiapine  25 mg Oral BID  . thiamine  100 mg Oral Daily  . zinc sulfate  220 mg Oral Daily   Continuous Infusions: . sodium chloride Stopped (02/21/20 2227)   PRN Meds:.sodium chloride, haloperidol lactate, lip balm, loperamide, metoprolol tartrate, ondansetron **OR** ondansetron (ZOFRAN) IV, Resource ThickenUp Clear    Bonnielee Haff M.D on 03/09/2020 at 10:24 AM  To page go to [email or web address] -  use universal password  [facility] -  Office  [phone]    Objective:   Vitals:   03/09/20 0315 03/09/20 0406 03/09/20 0500 03/09/20 0730  BP: (!) 177/68  (!) 175/62 (!) 123/52  Pulse:    96  Resp: (!) 22   18  Temp: 99.2 F (37.3 C)  99.3 F (37.4 C) 98.6 F (37 C)  TempSrc: Oral  Oral Oral  SpO2: 99%  98% 97%  Weight:  84.7 kg    Height:        Wt Readings from Last 3 Encounters:  03/09/20 84.7 kg  01/28/20 86.2 kg  01/24/20 89.4 kg     Intake/Output Summary (Last 24 hours) at 03/09/2020 1024 Last data filed at 03/09/2020 0600 Gross per 24 hour  Intake 635.67 ml  Output --  Net 635.67 ml     Physical Exam  General appearance: Awake alert.  In no distress.  Remains distracted Excoriation noted to the back of his head. Able to move his neck without any pain. Resp: Clear to auscultation bilaterally.  Normal effort Cardio: S1-S2 is normal regular.  No S3-S4.  No rubs murmurs or bruit GI: Abdomen is soft.  Nontender nondistended.  Bowel sounds are present normal.  No masses organomegaly Extremities: No edema.  Full range of motion of lower extremities. Neurologic: No focal neurological deficits.         Data Review:    CBC Recent Labs  Lab 03/02/20 1202 03/03/20 0929 03/04/20 1555 03/05/20 1043 03/08/20 0808  WBC 8.9 8.8 9.5 8.3 7.8  HGB 8.2* 7.6* 7.6* 8.1* 5.9*  HCT 26.2* 24.2* 24.3* 25.7* 18.4*  PLT 62* 62* 90* 94* 127*  MCV 86.8 86.1 85.6 85.1 84.0  MCH 27.2 27.0 26.8 26.8 26.9  MCHC 31.3 31.4 31.3 31.5 32.1  RDW 23.3* 22.8* 22.9* 22.8* 22.4*    Chemistries  Recent Labs  Lab 03/02/20 1202 03/03/20 0929 03/04/20 1555 03/05/20 1043 03/08/20 0808  NA 139 136 134* 138 139  K 4.2 4.2 3.9 4.0 3.8  CL 103 103 99 101 103  CO2 26 23 25 23 23   GLUCOSE 224* 184* 166* 134* 83  BUN 45* 57* 38* 49* 40*  CREATININE 6.04* 7.54* 5.89* 6.92* 8.04*  CALCIUM 7.9* 7.9* 7.9* 8.2* 7.7*  AST 35 28 32 30  --   ALT 20 11 8 9   --   ALKPHOS  108 92 98 93  --   BILITOT 0.8 1.1 1.0 1.3*  --  Radiology Reports CT Head Wo Contrast  Result Date: 02/20/2020 CLINICAL DATA:  Delirium.  COVID-19 positive.  Recent neck surgery. EXAM: CT HEAD WITHOUT CONTRAST TECHNIQUE: Contiguous axial images were obtained from the base of the skull through the vertex without intravenous contrast. COMPARISON:  January 28, 2020 FINDINGS: Brain: No subdural, epidural, or subarachnoid hemorrhage. A lacunar infarct in the right thalamus is stable and nonacute. Moderate white matter changes again identified. No acute cortical ischemia or acute infarct noted. No mass effect or midline shift. Ventricles and sulci are unchanged. Cerebellum, brainstem, and basal cisterns are normal. Vascular: Calcified atherosclerosis in the intracranial carotids. Skull: Normal. Negative for fracture or focal lesion. Sinuses/Orbits: Mild scattered mucosal thickening in the ethmoid air cells and left maxillary sinus. No air-fluid levels. Mastoid air cells and middle ears are well aerated. Other: None. IMPRESSION: 1. No acute intracranial abnormalities. Chronic white matter changes. Chronic right thalamic lacunar infarct. 2. Mild sinus disease as above. Electronically Signed   By: Dorise Bullion III M.D   On: 02/20/2020 14:49   CT Cervical Spine Wo Contrast  Result Date: 02/20/2020 CLINICAL DATA:  COVID positive, recent neck surgery EXAM: CT CERVICAL SPINE WITHOUT CONTRAST TECHNIQUE: Multidetector CT imaging of the cervical spine was performed without intravenous contrast. Multiplanar CT image reconstructions were also generated. COMPARISON:  CT cervical spine, 01/28/2020 FINDINGS: Alignment: Normal. Skull base and vertebrae: No acute fracture. No primary bone lesion. Erosive change of the dens and adjacent transverse process of C1 are not significantly changed compared to prior examination. Soft tissues and spinal canal: Assessment of the soft tissues is somewhat limited by metallic streak  artifact and lack of intravenous contrast; within this limitation, no unexpected fluid collection overlying the laminectomy site. Prevertebral soft tissue edema appears reduced compared to prior examination. No visible canal hematoma. Disc levels: Status post posterior laminectomy and fusion of C4 through T1, with moderate disc space height loss and osteophytosis of C6-C7 and C7-T1. Upper chest: Negative. Other: None. IMPRESSION: 1. Redemonstrated postoperative findings status post posterior laminectomy and fusion of C4 through T1, with moderate disc space height loss and osteophytosis of C6-C7 and C7-T1. 2. Prevertebral soft tissue edema appears reduced compared to prior examination. 3. Postoperative fluid collection overlying laminectomy site does not appear significantly changed compared to prior examination. 4. Assessment of the soft tissues and epidural space is generally limited on noncontrast CT and additionally limited by metallic streak artifact; contrast enhanced MRI is the test of choice for evaluation of edema, fluid collections, the epidural space, and discitis osteomyelitis if clinically suspected in the postoperative setting. Electronically Signed   By: Eddie Candle M.D.   On: 02/20/2020 14:53   MR CERVICAL SPINE WO CONTRAST  Result Date: 02/22/2020 CLINICAL DATA:  69 year old male with history of dorsal cervical epidural abscess, status post operative drainage and decompression last month on 01/06/2020. Follow-up MRI 01/29/2020 with new left occipital condyle, anterior C1 and odontoid marrow edema and erosion compatible with skull base osteomyelitis. End stage renal disease on dialysis. Confusion. Positive COVID-19. EXAM: MRI CERVICAL SPINE WITHOUT CONTRAST TECHNIQUE: Multiplanar, multisequence MR imaging of the cervical spine was performed. No intravenous contrast was administered. COMPARISON:  Recent CT head and cervical spine 02/20/2020. Postoperative MRI 01/29/2020. Preoperative MRI FINDINGS:  The examination had to be discontinued just prior to completion due to confusion, agitation. Only axial GRE imaging was not obtained. Alignment: Straightening and mild reversal of cervical lordosis remains improved since 01/04/2020. Vertebrae: Abnormal marrow edema and/or T2 and STIR hyperintense  soft tissue surrounding the odontoid (series 3, image 7) which has been partially eroded along with the adjacent clivus and anterior C1 ring as seen on the recent CT. Mild marrow edema in both occipital condyles, greater on the left. Faint marrow edema also in the C2-C3 facets, mostly on the right (series 3, image 2). Overall the appearance has not progressed since 01/22/2020. Sequelae of posterior decompression and fusion from C3 through T1. Mild hardware susceptibility artifact. No other cervical or upper thoracic marrow edema or acute osseous abnormality. Cord: Suboptimal cord detail due to motion and hardware susceptibility. No definite cervical or upper thoracic spinal cord signal abnormality. Normalized epidural space since August. Posterior Fossa, vertebral arteries, paraspinal tissues: Major vascular flow voids are preserved in the neck. Postoperative changes to the posterior neck soft tissues with no adverse features. Negative visible lung apices. Cervicomedullary junction and visible posterior fossa are stable and within normal limits. Disc levels: C2-C3: Improved thecal sac patency since 01/04/2020. Foraminal endplate spurring and moderate bilateral facet degeneration. Mild residual spinal stenosis. Mild bilateral C3 foraminal stenosis. C3-C4: Chronic central disc protrusion (series 2, image 7) with mild facet and ligament flavum hypertrophy. Improved thecal sac patency since 01/04/2020 but residual mild spinal stenosis and mild cord mass effect. Moderate bilateral C4 foraminal stenosis. C4-C5 through T1-T2: Posterior decompression with no significant spinal stenosis despite residual disc and endplate  degeneration at some levels. There is mild to moderate bilateral C8 foraminal stenosis related to disc, endplate and residual facet hypertrophy at C7-T1. IMPRESSION: 1. 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 Possible involvement of the right C2-C3 facet joint also. Associated erosion of bone since 01/28/2020 as demonstrated by CT two days ago. But on MRI no other significant progression is identified since August; no new levels of involvement. 2. Resolved posterior epidural abscess following surgery last month. Satisfactorily decompressed levels C4-C5 through T1-T2. 3. Improved thecal sac patency but mild residual degenerative spinal stenosis at both C2-C3 and C3-C4. No definite spinal cord signal abnormality. Electronically Signed   By: Genevie Ann M.D.   On: 02/22/2020 11:48   DG CHEST PORT 1 VIEW  Result Date: 03/01/2020 CLINICAL DATA:  Respiratory distress.  COVID. EXAM: PORTABLE CHEST 1 VIEW COMPARISON:  02/24/2020 FINDINGS: The endotracheal tube is been removed in the enteric tube converted for a feeding tube. Enteric tube tip is off the field of view but below the left hemidiaphragm. Right central venous catheter is unchanged in position. No pneumothorax. Cardiac enlargement. Bilateral basilar infiltrates suggesting multifocal pneumonia. IMPRESSION: Bilateral basilar infiltrates suggesting multifocal pneumonia. Electronically Signed   By: Lucienne Capers M.D.   On: 03/01/2020 05:02   DG CHEST PORT 1 VIEW  Result Date: 02/24/2020 CLINICAL DATA:  COVID-19 positive 02/20/2020, respiratory failure, intubated EXAM: PORTABLE CHEST 1 VIEW COMPARISON:  02/23/2020 FINDINGS: Single frontal view of the chest demonstrates endotracheal tube well above carina. Enteric catheter passes below diaphragm tip excluded by collimation. Bilateral internal jugular catheters are unchanged. Cardiac silhouette is stable. Persistent patchy consolidation at the lung bases, unchanged. No effusion or pneumothorax.  No acute bony abnormalities. IMPRESSION: 1. Stable support devices. 2. Persistent bibasilar patchy consolidation which could reflect atelectasis or pneumonia. Electronically Signed   By: Randa Ngo M.D.   On: 02/24/2020 18:07   DG CHEST PORT 1 VIEW  Result Date: 02/23/2020 CLINICAL DATA:  Encounter for central line placement EXAM: PORTABLE CHEST 1 VIEW COMPARISON:  02/21/2020 FINDINGS: Endotracheal tube approximately 2 cm above the carina. NG tube  enters the stomach with the tip not visualized. Right jugular central venous catheter tip in the right atrium unchanged. New left jugular catheter with the tip in the proximal SVC. No pneumothorax. Mild right lower lobe atelectasis. Negative for edema or effusion IMPRESSION: Left jugular central venous catheter tip in the SVC. No pneumothorax. Mild right lower lobe atelectasis. Endotracheal tube 2 cm above the carina. Electronically Signed   By: Franchot Gallo M.D.   On: 02/23/2020 15:14   DG Chest Port 1 View  Result Date: 02/20/2020 CLINICAL DATA:  COVID EXAM: PORTABLE CHEST 1 VIEW COMPARISON:  01/12/2020 FINDINGS: Interval extubation. Interval placement of large-bore right neck vascular catheter, tip position near the superior cavoatrial junction. The heart size and mediastinal contours are within normal limits. Both lungs are clear. The visualized skeletal structures are unremarkable. IMPRESSION: 1. Interval placement of large-bore right neck vascular catheter, tip position near the superior cavoatrial junction. 2. No acute abnormality of the lungs in AP portable projection. Electronically Signed   By: Eddie Candle M.D.   On: 02/20/2020 13:43   DG Chest Port 1V same Day  Result Date: 03/02/2020 CLINICAL DATA:  Shortness of breath.  Altered mental status. EXAM: PORTABLE CHEST 1 VIEW COMPARISON:  Radiograph yesterday. FINDINGS: Enteric tube remains in place with tip below the diaphragm. Right internal jugular dialysis catheter unchanged. Improving  cardiomegaly with stable mediastinal contours. Persistent but improving patchy bibasilar opacities. No pneumothorax. No large pleural effusion. Surgical hardware in the lower cervical spine is partially included. IMPRESSION: 1. Persistent but improving patchy bibasilar opacities, atelectasis versus pneumonia. 2. Improving cardiomegaly. Electronically Signed   By: Keith Rake M.D.   On: 03/02/2020 15:17   DG Chest Port 1V same Day  Result Date: 02/21/2020 CLINICAL DATA:  Shortness of breath, COVID EXAM: PORTABLE CHEST 1 VIEW COMPARISON:  02/20/2020 FINDINGS: Right dialysis catheter remains in place, unchanged. Heart is normal size. No confluent airspace opacities or effusions. No acute bony abnormality. IMPRESSION: No acute cardiopulmonary disease. Electronically Signed   By: Rolm Baptise M.D.   On: 02/21/2020 13:03   DG Swallowing Func-Speech Pathology  Result Date: 03/03/2020 Objective Swallowing Evaluation: Type of Study: MBS-Modified Barium Swallow Study  Patient Details Name: Rivaldo Hineman. MRN: [id number] Date of Birth: [birth year] Today's Date: 03/03/2020 Time: SLP Start Time (ACUTE ONLY): 0998 -SLP Stop Time (ACUTE ONLY): 1418 SLP Time Calculation (min) (ACUTE ONLY): 24 min Past Medical History: Past Medical History: Diagnosis Date . Anemia  . Arthritis   left knee . Chronic kidney disease   acute renal failure/injury requiring short-term HD 2013 . Colon cancer ([place])  . Colon polyp   11/2011 - Polyps identified, biopsy - invasive adenocarinoma . DM II (diabetes mellitus, type II), controlled ([place])   type 2 IDDM x 15 years. A1C 1/09 13.7.  Marland Kitchen 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 12/25/2011 . GERD (gastroesophageal reflux disease)  . Heart murmur  . Hx of ileostomy 10/13/2012 . Hyperlipidemia  . Hypertension   for a few years and resolved in 2013/2014. . Sleep apnea   does not wear CPAP now . Wears glasses  Past Surgical History: Past Surgical History: Procedure Laterality Date .  APPLICATION OF WOUND VAC  3/38/2505  Procedure: APPLICATION OF WOUND VAC;  Surgeon: Zenovia Jarred, MD;  Location: [facility];  Service: General;  Laterality: N/A; . APPLICATION OF WOUND VAC  3/97/6734  Procedure: APPLICATION OF WOUND VAC;  Surgeon: Imogene Burn. [name], MD;  Location: [place];  Service:  General;  Laterality: N/A; . AV FISTULA PLACEMENT Left 07/29/2019  Procedure: ARTERIOVENOUS (AV) FISTULA CREATION;  Surgeon: Marty Heck, MD;  Location: [place];  Service: Vascular;  Laterality: Left; . COLONOSCOPY   . COLONOSCOPY  02/06/2012  Procedure: COLONOSCOPY;  Surgeon: Milus Banister, MD;  Location: [facility];  Service: Endoscopy;; . COLONOSCOPY WITH PROPOFOL N/A 06/10/2019  Procedure: COLONOSCOPY WITH PROPOFOL;  Surgeon: Milus Banister, MD;  Location: [facility];  Service: Endoscopy;  Laterality: N/A; . COLOSTOMY REVERSAL   . DRESSING CHANGE UNDER ANESTHESIA  02/18/2012  Procedure: DRESSING CHANGE UNDER ANESTHESIA;  Surgeon: Odis Hollingshead, MD;  Location: [facility];  Service: General;  Laterality: N/A; . ESOPHAGOGASTRODUODENOSCOPY (EGD) WITH PROPOFOL N/A 06/10/2019  Procedure: ESOPHAGOGASTRODUODENOSCOPY (EGD) WITH PROPOFOL;  Surgeon: Milus Banister, MD;  [facility];  Service: Endoscopy;  Laterality: N/A; . GANGLION CYST EXCISION  20 YRS AGO  RT ARM . ILEOSTOMY  02/16/2012  Procedure: ILEOSTOMY;  Surgeon: Zenovia Jarred, MD;  Location: [facility];  Service: General;  Laterality: N/A; . ILEOSTOMY CLOSURE N/A 09/02/2012  Procedure: ILEOSTOMY REVERSAL;  Surgeon: Imogene Burn. [name], MD;  Location: [place];  Service: General;  Laterality: N/A; . ILIOSTOMY   . INCISION AND DRAINAGE OF WOUND  02/23/2012  Procedure: IRRIGATION AND DEBRIDEMENT WOUND;  Surgeon: Joyice Faster. Cornett, MD;  Location: [place];  Service: General;  Laterality: N/A; . IR DIALY SHUNT INTRO [place] W/IMG LEFT Left 01/21/2020 . IR FLUORO GUIDE CV LINE RIGHT  01/21/2020 . IR REMOVAL TUN CV CATH W/O FL  01/07/2020 . IR US GUIDE VASC ACCESS LEFT   01/21/2020 . IR US GUIDE VASC ACCESS RIGHT  01/21/2020 . LAPAROTOMY  02/16/2012  Procedure: EXPLORATORY LAPAROTOMY;  Surgeon: Zenovia Jarred, MD;  Location: [place] [facility];  Service: General;  Laterality: N/A;  Exploratory Laparotomy with resection of anastomosis . LAPAROTOMY  02/18/2012  Procedure: EXPLORATORY LAPAROTOMY;  Surgeon: Odis Hollingshead, MD;  Location: [facility];  Service: General;  Laterality: N/A;  exploratory laparotomy  change of abdominal vac dressing . LAPAROTOMY  02/20/2012  Procedure: EXPLORATORY LAPAROTOMY;  Surgeon: Odis Hollingshead, MD;  Location: [facility];  Service: General;  Laterality: N/A; . LAPAROTOMY  02/23/2012  Procedure: EXPLORATORY LAPAROTOMY;  Surgeon: Joyice Faster. Cornett, MD;  Location: [facility];  Service: General;  Laterality: N/A;  Irrigation and Debridement of abdominal wound with wound vac change with partial closure . LAPAROTOMY  02/26/2012  Procedure: EXPLORATORY LAPAROTOMY;  Surgeon: Imogene Burn. [name], MD;  Location: [facility];  Service: General;  Laterality: N/A;   . PARTIAL COLECTOMY  02/06/2012 . PARTIAL COLECTOMY  02/06/2012  Procedure: PARTIAL COLECTOMY;  Surgeon: Imogene Burn. [name], MD;  Location: [place];  Service: General;  Laterality: N/A;  right partial colectomy . POSTERIOR CERVICAL FUSION/FORAMINOTOMY N/A 01/06/2020  Procedure: CERVICAL FOUR- THORACIC TWO LAMINECTOMY AND FUSION;  Surgeon: Consuella Lose, MD;  Location: [facility];  Service: Neurosurgery;  Laterality: N/A;  CERVICAL FOUR- THORACIC TWO LAMINECTOMY AND FUSION . RESECTION SMALL BOWEL / CLOSURE ILEOSTOMY  402/2014  Dr [name] . VACUUM ASSISTED CLOSURE CHANGE  02/20/2012  Procedure: ABDOMINAL VACUUM ASSISTED CLOSURE CHANGE;  Surgeon: Odis Hollingshead, MD;  Location: [facility];  Service: General;; . VACUUM ASSISTED CLOSURE CHANGE  02/23/2012  Procedure: ABDOMINAL VACUUM ASSISTED CLOSURE CHANGE;  Surgeon: Joyice Faster. Cornett, MD;  Location: [facility];  Service: General;  Laterality: N/A; HPI: Keevin Apelles Denis Carreon. is a 69 y.o. male with medical  history significant of GERD, hypertension, hyperlipidemia  ESRD on hemodialysis, type 2 diabetes mellitus, anemia of chronic disease, colon cancer status post resection, epidural abscess status post laminectomy, posterior cervical fusion on 01/06/2020 and MRSA bacteremia and presented emergently for evaluation of confusion and COVID-19 positive. Initial BSE 02/21/20 was limited- needed enouragement, audible swallow, eructation, poor dentition and reduced endurance, Work of breathing was stable. Dys 2/thin recommended. On 9/22 became unresponsive after HD, transferred to ICU and intubated then extubated 9/25. Currently has NGT, encephalopathy improving per MD note and speech to re-eval. CXR Bilateral basilar infiltrates suggesting multifocal pneumonia.  No data recorded Assessment / Plan / Recommendation CHL IP CLINICAL IMPRESSIONS 03/03/2020 Clinical Impression Pt demonstrates mild orpharyngeal dysphagia mostly related to anatomical differences. 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 In addition, initiation of swallow is intermittently late with boluses reaching the pyriform sinuses for up to 5 seconds prior to swallow. His epiglottis at times does not fully invert adding to inadequate protection. Thin barium was penetrated before and during the swallow remaining superior to the vocal cords. His reflexive throat clear, present most of the time did not clear vestibule and a chin tuck was not effective. Penetration was present once with nectar at end of study. Nectar thick recommended for pt to initiate diet and upgrade to thin from clinical observation with SLP. Dys 2 texture recommended, crush pills, small sips/bites, intermittent throat clear and avoid straws.    SLP Visit Diagnosis Dysphagia, pharyngeal phase (R13.13) Attention and concentration deficit following -- Frontal lobe and executive function deficit  following -- Impact on safety and function Moderate aspiration risk   CHL IP TREATMENT RECOMMENDATION 03/03/2020 Treatment Recommendations Therapy as outlined in treatment plan below   Prognosis 03/03/2020 Prognosis for Safe Diet Advancement Good Barriers to Reach Goals -- Barriers/Prognosis Comment -- CHL IP DIET RECOMMENDATION 03/03/2020 SLP Diet Recommendations Dysphagia 2 (Fine chop) solids;Nectar thick liquid Liquid Administration via Cup Medication Administration Crushed with puree Compensations Slow rate;Small sips/bites;Clear throat intermittently Postural Changes Seated upright at 90 degrees   CHL IP OTHER RECOMMENDATIONS 03/03/2020 Recommended Consults -- Oral Care Recommendations Oral care BID Other Recommendations Order thickener from pharmacy   CHL IP FOLLOW UP RECOMMENDATIONS 03/03/2020 Follow up Recommendations (No Data)   CHL IP FREQUENCY AND DURATION 03/03/2020 Speech Therapy Frequency (ACUTE ONLY) min 2x/week Treatment Duration 2 weeks      CHL IP ORAL PHASE 03/03/2020 Oral Phase Impaired Oral - Pudding Teaspoon -- Oral - Pudding Cup -- Oral - Honey Teaspoon -- Oral - Honey Cup -- Oral - Nectar Teaspoon -- Oral - Nectar Cup WFL Oral - Nectar Straw -- Oral - Thin Teaspoon -- Oral - Thin Cup Decreased bolus cohesion Oral - Thin Straw -- Oral - Puree -- Oral - Mech Soft -- Oral - Regular Delayed oral transit Oral - Multi-Consistency -- Oral - Pill -- Oral Phase - Comment --  CHL IP PHARYNGEAL PHASE 03/03/2020 Pharyngeal Phase Impaired Pharyngeal- Pudding Teaspoon -- Pharyngeal -- Pharyngeal- Pudding Cup -- Pharyngeal -- Pharyngeal- Honey Teaspoon -- Pharyngeal -- Pharyngeal- Honey Cup -- Pharyngeal -- Pharyngeal- Nectar Teaspoon -- Pharyngeal -- Pharyngeal- Nectar Cup Delayed swallow initiation-pyriform sinuses;Reduced epiglottic inversion;Reduced airway/laryngeal closure;Penetration/Aspiration during swallow;Pharyngeal residue - valleculae Pharyngeal Material enters airway, remains ABOVE vocal cords and not  ejected out Pharyngeal- Nectar Straw -- Pharyngeal -- Pharyngeal- Thin Teaspoon -- Pharyngeal -- Pharyngeal- Thin Cup Delayed swallow initiation-pyriform sinuses;Pharyngeal residue - valleculae;Pharyngeal residue - pyriform;Penetration/Aspiration before swallow;Reduced airway/laryngeal closure  Pharyngeal Material enters airway, remains ABOVE vocal cords and not ejected out Pharyngeal- Thin Straw -- Pharyngeal -- Pharyngeal- Puree -- Pharyngeal -- Pharyngeal- Mechanical Soft -- Pharyngeal -- Pharyngeal- Regular WFL Pharyngeal -- Pharyngeal- Multi-consistency -- Pharyngeal -- Pharyngeal- Pill -- Pharyngeal -- Pharyngeal Comment --  CHL IP CERVICAL ESOPHAGEAL PHASE 03/03/2020 Cervical Esophageal Phase WFL Pudding Teaspoon -- Pudding Cup -- Honey Teaspoon -- Honey Cup -- Nectar Teaspoon -- Nectar Cup -- Nectar Straw -- Thin Teaspoon -- Thin Cup -- Thin Straw -- Puree -- Mechanical Soft -- Regular -- Multi-consistency -- Pill -- Cervical Esophageal Comment -- Houston Siren 03/03/2020, 4:35 PM Orbie Pyo Litaker M.Ed Actor Pager [phone] Office [phone]              VAS [place] LOWER EXTREMITY VENOUS (DVT)  Result Date: 02/23/2020  Lower Venous DVTStudy Indications: Elevated D dimer. Other Indications: + Covid. Risk Factors: None identified. Performing Technologist: Griffin Basil RCT RDMS  Examination Guidelines: A complete evaluation includes B-mode imaging, spectral Doppler, color Doppler, and power Doppler as needed of all accessible portions of each vessel. Bilateral testing is considered an integral part of a complete examination. Limited examinations for reoccurring indications may be performed as noted. The reflux portion of the exam is performed with the patient in reverse Trendelenburg.  +---------+---------------+---------+-----------+----------+--------------+ RIGHT    CompressibilityPhasicitySpontaneityPropertiesThrombus Aging  +---------+---------------+---------+-----------+----------+--------------+ CFV      Full           Yes      Yes                                 +---------+---------------+---------+-----------+----------+--------------+ SFJ      Full                                                        +---------+---------------+---------+-----------+----------+--------------+ FV Prox  Full                                                        +---------+---------------+---------+-----------+----------+--------------+ FV Mid   Full                                                        +---------+---------------+---------+-----------+----------+--------------+ FV DistalFull                                                        +---------+---------------+---------+-----------+----------+--------------+ PFV      Full                                                        +---------+---------------+---------+-----------+----------+--------------+ POP  Full           Yes      Yes                                 +---------+---------------+---------+-----------+----------+--------------+ PTV      Full                                                        +---------+---------------+---------+-----------+----------+--------------+ PERO     Full                                                        +---------+---------------+---------+-----------+----------+--------------+   +---------+---------------+---------+-----------+----------+--------------+ LEFT     CompressibilityPhasicitySpontaneityPropertiesThrombus Aging +---------+---------------+---------+-----------+----------+--------------+ CFV      Full           Yes      Yes                                 +---------+---------------+---------+-----------+----------+--------------+ SFJ      Full                                                         +---------+---------------+---------+-----------+----------+--------------+ FV Prox  Full                                                        +---------+---------------+---------+-----------+----------+--------------+ FV Mid   Full                                                        +---------+---------------+---------+-----------+----------+--------------+ FV DistalFull                                                        +---------+---------------+---------+-----------+----------+--------------+ PFV      Full                                                        +---------+---------------+---------+-----------+----------+--------------+ POP      Full           Yes      Yes                                 +---------+---------------+---------+-----------+----------+--------------+  PTV      Full                                                        +---------+---------------+---------+-----------+----------+--------------+ PERO     Full                                                        +---------+---------------+---------+-----------+----------+--------------+     Summary: RIGHT: - There is no evidence of deep vein thrombosis in the lower extremity.  - No cystic structure found in the popliteal fossa.  LEFT: - There is no evidence of deep vein thrombosis in the lower extremity.  - No cystic structure found in the popliteal fossa.  *See table(s) above for measurements and observations. Electronically signed by Harold Barban MD on 02/23/2020 at 9:31:43 PM.    Final    US Abdomen Limited RUQ  Result Date: 02/23/2020 CLINICAL DATA:  Transaminitis EXAM: ULTRASOUND ABDOMEN LIMITED RIGHT UPPER QUADRANT COMPARISON:  None. FINDINGS: Gallbladder: The gallbladder was not visualized and may be surgically absent Common bile duct: Diameter: 3 mm Liver: The liver parenchyma is coarsened and heterogeneous. The liver surface appears somewhat nodular there is no  discrete hepatic mass. There is suggestion of bidirectional flow within the portal vein Other: The right kidney appears atrophic and echogenic. There are multiple right-sided renal cysts measuring up to approximately 3.6 cm. IMPRESSION: 1. Gallbladder not visualized. 2. Coarsened and heterogeneous appearance of the liver parenchyma with findings suspicious for underlying cirrhosis. 3. Borderline bidirectional flow within the main portal vein is suggestive of portal hypertension. 4. Echogenic and atrophic appearing right kidney consistent with the patient's history of renal failure. Electronically Signed   By: Constance Holster M.D.   On: 02/23/2020 19:21

## 2020-03-09 NOTE — Progress Notes (Signed)
Night shift CNA notified nurses that patient was lying on floor during shift change.Patient noted to be lying on floor faced down on mat when day shift and night shift nurses arrived in patient room . Patient a/o/vx3, denying pain, he was able to move all extremities without discomfort. V/S stable, staffx4 assisted patient back to bed. Attending MD notified(please see orders). Daughter-Rene notified also.

## 2020-03-09 NOTE — Consult Note (Addendum)
WOC Nurse Consult Note: Consult was performed on 10/4 for unstageable pressure injury to sacrum; refer to progress notes for measurements and assessment.  Previously had planned to begin hydrotherapy by physical therapy once the patient is no longer on isolation for Covid.  He is off isolation at this time; however there are multiple entries in the chart that the patient has been refusing dressing changes to be performed by the bedside nurses, and I do not feel that hydrotherapy is appropriate for a patient who is not compliant or willing to have this treatment performed.  Topical treatment orders provided for bedside nurses to perform as follows to provide enzymatic debridement of nonviable tissue: Continue present plan of care as follows: apply Santyl to buttocks/sacrum wound Q day, then cover with moist gauze and dry ABD pad and tape.  [facility] team will assess the site weekly and revise the plan of care if indicated at that time.  Julien Girt MSN, RN, [place], [place], [place]

## 2020-03-09 NOTE — Progress Notes (Signed)
Patient refused has PO meds this morning.

## 2020-03-09 NOTE — Progress Notes (Signed)
PT Cancellation Note  Patient Details Name: [name]. MRN: [id number] DOB: 27-Dec-1950   Cancelled Treatment:    Reason Eval/Treat Not Completed: (P) Other (comment) (Pt had fall in room this AM and awaiting CT head/spine, will defer until after imaging completed.) Will attempt later in day per PT POC as schedule permits.   Keali Mccraw M Asser Lucena 03/09/2020, 9:06 AM

## 2020-03-09 NOTE — Progress Notes (Signed)
Physical Therapy Treatment Patient Details Name: [name]. MRN: [id number] DOB: Sep 12, 1950 Today's Date: 03/09/2020    History of Present Illness Pt is a 69 y.o. male with d/c to SNF after prolonged complicated admission s/p C4-T2 laminectomy fusion (01/06/20), now admitted from [facility] 02/20/20 with confusion. Workup for metabolic encephalopathy, shock hypoxia; (+) COVID-19. 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 ETT 9/22-9/25. PMH includes HTN, HLD, ESRD (HD MWF), colon CA s/p resection.    PT Comments    Pt supine on arrival, agreeable to therapy session with minimal encouragement, with good participation and tolerance for session. Primary session focus on progressing seated balance/BLE strength through seated exercise and transfer training. Pt able to perform bed mobility and transfers with decreased assist this session compared with previous session/AM presentation (per RN), but remains confused and needs mod multimodal cues for safety and technique. Deferred OOB to chair transfer for safety as pt had fall this AM (CT scan from AM shows no acute injury) and per RN pt has been intermittently agitated. Pt continues to benefit from PT services to progress toward functional mobility goals. Continue to recommend SNF level of rehab.  Follow Up Recommendations  SNF;Supervision/Assistance - 24 hour     Equipment Recommendations  None recommended by PT    Recommendations for Other Services       Precautions / Restrictions Precautions Precautions: Fall;Other (comment) (had fall AM 10/7 (CT scan no acute injury)) Precaution Comments: Rectal tube,  Restrictions Weight Bearing Restrictions: No    Mobility  Bed Mobility Overal bed mobility: Needs Assistance Bed Mobility: Rolling;Sidelying to Sit;Sit to Supine Rolling: Min guard Sidelying to sit: +2 for physical assistance;Min assist;HOB elevated (HOB elevated to ~20 deg)   Sit to  supine: Min assist (cues for reverse log roll)      Transfers Overall transfer level: Needs assistance Equipment used: Rolling walker (2 wheeled) Transfers: Sit to/from Stand Sit to Stand: Min assist;+2 physical assistance         General transfer comment: static standing at RW with minA of one person and able to perform 1-2 lateral steps with RW support and modA, impulsive to sit 2/2 confusion/poor sequencing       Balance Overall balance assessment: Needs assistance Sitting-balance support: Feet supported Sitting balance-Leahy Scale: Fair Sitting balance - Comments: Sitting at EOB with Min guard A to close Supervision   Standing balance support: Bilateral upper extremity supported Standing balance-Leahy Scale: Poor Standing balance comment: minA for static balance, mod-maxA for dynamic standing tasks               Cognition Arousal/Alertness: Awake/alert Behavior During Therapy: Restless;Impulsive Overall Cognitive Status: Impaired/Different from baseline Area of Impairment: Attention;Memory;Following commands;Safety/judgement;Awareness;Problem solving                 Orientation Level: Situation;Time;Place   Memory: Decreased short-term memory Following Commands: Follows one step commands with increased time;Follows one step commands inconsistently Safety/Judgement: Decreased awareness of safety;Decreased awareness of deficits   Problem Solving: Slow processing;Difficulty sequencing;Requires verbal cues;Requires tactile cues General Comments: pt asking to speak with daughters if possible      Exercises Other Exercises Other Exercises: seated BLE AROM: hip flexion, LAQ, ankle pumps 1x15 reps ea; STS x 3    General Comments        Pertinent Vitals/Pain Pain Assessment: 0-10 Pain Score: 5  Faces Pain Scale: Hurts a little bit Pain Location: "My tummy hurts" Pain Descriptors / Indicators: Grimacing;Guarding Pain  Intervention(s): Monitored during  session;Repositioned     PT Goals (current goals can now be found in the care plan section) Acute Rehab PT Goals Patient Stated Goal: "I want to go home" PT Goal Formulation: Patient unable to participate in goal setting Time For Goal Achievement: 03/06/20 Potential to Achieve Goals: Fair Progress towards PT goals: Progressing toward goals    Frequency    Min 2X/week      PT Plan Current plan remains appropriate       AM-PAC PT "6 Clicks" Mobility   Outcome Measure  Help needed turning from your back to your side while in a flat bed without using bedrails?: A Little Help needed moving from lying on your back to sitting on the side of a flat bed without using bedrails?: A Little Help needed moving to and from a bed to a chair (including a wheelchair)?: Total Help needed standing up from a chair using your arms (e.g., wheelchair or bedside chair)?: A Lot Help needed to walk in hospital room?: Total Help needed climbing 3-5 steps with a railing? : Total 6 Click Score: 11    End of Session Equipment Utilized During Treatment: Gait belt Activity Tolerance: Patient tolerated treatment well;Patient limited by fatigue Patient left: in bed;with bed alarm set;Other (comment);with call bell/phone within reach (bed at low height, fall mats in place) Nurse Communication: Mobility status;Other (comment) (could benefit from air bed due to sacral wound) PT Visit Diagnosis: Other abnormalities of gait and mobility (R26.89);Muscle weakness (generalized) (M62.81);Other symptoms and signs involving the nervous system (R29.898)     Time: 9458-5929 PT Time Calculation (min) (ACUTE ONLY): 29 min  Charges:  $Therapeutic Exercise: 8-22 mins $Therapeutic Activity: 8-22 mins                     Onda Kattner P., PTA Acute Rehabilitation Services Pager: [phone] Office: [place] 03/09/2020, 5:02 PM

## 2020-03-09 NOTE — Progress Notes (Signed)
2100 - Pt sitting on side end of bed.  He has disabled bed alarm and is attempting to adjust bed with control.  When asked, he states he needs to straighten out the bed.    Pt returned to bed, remotes hung on end of bed.  Pt refused all medications tonight.

## 2020-03-09 NOTE — Progress Notes (Signed)
   03/09/20 0705  What Happened  Was fall witnessed? No  Was patient injured? Unsure  Patient found on floor  Found by Staff-comment (nurse tech)  Follow Up  MD notified Bonnielee Haff, MD  Time MD notified [phone]  Family notified Yes - comment (Daughter-Rene Arana)  Time family notified [phone]  Additional tests Yes-comment  Adult Fall Risk Assessment  Risk Factor Category (scoring not indicated) High fall risk per protocol (document High fall risk)  Age 69  Fall History: Fall within 6 months prior to admission 0  Elimination; Bowel and/or Urine Incontinence 2  Elimination; Bowel and/or Urine Urgency/Frequency 0  Medications: includes PCA/Opiates, Anti-convulsants, Anti-hypertensives, Diuretics, Hypnotics, Laxatives, Sedatives, and Psychotropics 0  Patient Care Equipment 1  Mobility-Assistance 2  Mobility-Gait 2  Mobility-Sensory Deficit 0  Altered awareness of immediate physical environment 0  Impulsiveness 2  Lack of understanding of one's physical/cognitive limitations 0  Total Score 10  Patient Fall Risk Level High fall risk  Adult Fall Risk Interventions  Required Bundle Interventions *See Row Information* High fall risk - low, moderate, and high requirements implemented  Additional Interventions Use of appropriate toileting equipment (bedpan, BSC, etc.)  Screening for Fall Injury Risk (To be completed on HIGH fall risk patients) - Assessing Need for Low Bed  Risk For Fall Injury- Low Bed Criteria Previous fall this admission  Will Implement Low Bed and Floor Mats Yes  Screening for Fall Injury Risk (To be completed on HIGH fall risk patients who do not meet crieteria for Low Bed) - Assessing Need for Floor Mats Only  Risk For Fall Injury- Criteria for Floor Mats Confusion/dementia (+NuDESC, CIWA, TBI, etc.)  Will Implement Floor Mats Yes  Pain Assessment  Pain Scale 0-10  Pain Score 0  Faces Pain Scale 0  Neurological  Neuro (WDL) WDL  Level of Consciousness Alert   Orientation Level Oriented to person;Oriented to place;Oriented to situation  Cognition Follows commands  Speech Clear  Pupil Assessment  No  Motor Function/Sensation Assessment Grip;Dorsiflexion  Facial Symmetry Symmetrical  R Hand Grip Moderate  L Hand Grip Moderate   Right Pronator Drift Present  Left Pronator Drift Present  R Foot Dorsiflexion Weak  L Foot Dorsiflexion Weak  R Foot Plantar Flexion Weak  L Foot Plantar Flexion Weak  RUE Motor Response Responds to commands  RUE Sensation Full sensation  RUE Motor Strength 4  LUE Motor Response Responds to commands  LUE Sensation Full sensation  LUE Motor Strength 4  RLE Motor Response Responds to commands  RLE Sensation Full sensation  RLE Motor Strength 4  LLE Motor Response Responds to commands  LLE Sensation Full sensation  LLE Motor Strength 4  Neuro Symptoms Forgetful  Neuro symptoms relieved by Rest  Glasgow Coma Scale  Eye Opening 4  Best Verbal Response (NON-intubated) 4  Modified Verbal Response (INTUBATED) 3  Best Motor Response 6  Glasgow Coma Scale Score (!) 17  Musculoskeletal  Assistive Device None  Generalized Weakness Yes  Weight Bearing Restrictions No  Integumentary  Skin Color Appropriate for ethnicity  Skin Condition Dry;Flaky  Skin Integrity Surgical Incision (see LDA)  Abrasion Location Buttocks;Head  Abrasion Location Orientation Mid  Abrasion Intervention Cleansed  Catheter Entry/Exit Location Chest  Catheter Entry/Exit Location Orientation Right  Catheter Entry/Exit Intervention Other (Comment) (assessed)  Excoriated Location Buttocks  Excoriated Location Orientation Bilateral  Excoriated Intervention Barrier cream  Skin Turgor Non-tenting

## 2020-03-09 NOTE — Progress Notes (Signed)
  Speech Language Pathology Treatment: Dysphagia  Patient Details Name: [name]. MRN: [id number] DOB: 11-Feb-1951 Today's Date: 03/09/2020 Time: 2481-8590 SLP Time Calculation (min) (ACUTE ONLY): 20 min  Assessment / Plan / Recommendation Clinical Impression  Pt elicited a reflexive cough when penetration to vocal cords present during MBS therefore therapist trialed with thin water at bedside for possible upgrade. RN tech reports pt has stated the food is cold and he has not been drinking or eating much. He followed directions for small cup and straw sips water for limited trial and became internally distracted and inattentive. Timely mastication without residue. Therapist will upgrade texture to Dys 3, upgrade to thin, pills whole in puree, straws allowed, small sips and ST will follow up.    HPI HPI: [name]. is a 69 y.o. male with medical history significant of GERD, hypertension, hyperlipidemia ESRD on hemodialysis, type 2 diabetes mellitus, anemia of chronic disease, colon cancer status post resection, epidural abscess status post laminectomy, posterior cervical fusion on 01/06/2020 and MRSA bacteremia and presented emergently for evaluation of confusion and COVID-19 positive. Initial BSE 02/21/20 was limited- needed enouragement, audible swallow, eructation, poor dentition and reduced endurance, Work of breathing was stable. Dys 2/thin recommended. On 9/22 became unresponsive after HD, transferred to ICU and intubated then extubated 9/25. Currently has NGT, encephalopathy improving per MD note and speech to re-eval. CXR Bilateral basilar infiltrates suggesting multifocal pneumonia.      SLP Plan  Continue with current plan of care       Recommendations  Diet recommendations: Dysphagia 3 (mechanical soft);Thin liquid Liquids provided via: Cup;Straw Medication Administration: Whole meds with puree Supervision: Patient able to self feed;Full  supervision/cueing for compensatory strategies Compensations: Slow rate;Small sips/bites;Clear throat intermittently Postural Changes and/or Swallow Maneuvers: Seated upright 90 degrees                Oral Care Recommendations: Oral care BID Follow up Recommendations: 24 hour supervision/assistance SLP Visit Diagnosis: Dysphagia, unspecified (R13.10) Plan: Continue with current plan of care       GO                Houston Siren 03/09/2020, 11:24 AM  Orbie Pyo Colvin Caroli.Ed Risk analyst [phone] Office [phone]

## 2020-03-09 NOTE — Progress Notes (Signed)
[facility] Progress Note  Subjective:   Pt seen in room, no c/o's.  abd pain resolved.  Hb 8.5 today.   Vitals:   03/09/20 0315 03/09/20 0406 03/09/20 0500 03/09/20 0730  BP: (!) 177/68  (!) 175/62 (!) 123/52  Pulse:    96  Resp: (!) 22   18  Temp: 99.2 F (37.3 C)  99.3 F (37.4 C) 98.6 F (37 C)  TempSrc: Oral  Oral Oral  SpO2: 99%  98% 97%  Weight:  84.7 kg    Height:        Exam:  alert, nad   no jvd  Chest cta bilat  Cor reg no RG  Abd soft ntnd no ascites   Ext no LE edema   Alert, NF, ox3  +TDC/ L AVF+bruit       CXR 9/23 - patchy perihilar/ basilar dz, no edema      OP HD: [facility] MWF   4h 55min  87kg  2/2 bath  TDC / L AVF  Hep none  -Mircera 150 mcg IV q 2 weeks (last dose 02/09/2020) -Venofer 100 mg IV X 8 doses (not started yet) -Calcitriol 1.5 mcg PO TIW  Assessment/ Plan: 1. COVID 19-recent diagnosis. Tested positive at [facility] 09/18 and again here.  Per primary team. COVID isolation ends on 03/12/20, which is this Sunday.  2. Acute hypoxic resp failure.  At one point required mechanical ventilation. Resolved.  3. AMS - better now, responsive and interacting, but continues to refuse some medical Rx's intermittently. Seen by psych, able to make his own decisions.  4. Recent epidural abscess/MRSA cervical discitis-  S/p decompression surgery C4-T2 on 01/06/20. S/p Vancomycin and Rifampin here through 10/01 but rifampin dc'd early due to ^LFT"s. PO doxy started on 10/01 and pt is now off IV vanc.   5.  ESRD - MWF HD. HIT testing was negative. OK to use heparin to block catheter now. Signed off on HD early Wed. Next HD Friday.  6.  HD access - L BC AVF placed Feb 2021. Was used starting in May and had infiltration in Aug prompting [facility] placement to rest the AVF. Have used AVF w/o issues on last 3 HD sessions. We are considering TDC removal, but due to lack of good IV access will keep in for now.  7.  Hypertension/volume - Previously on high dose  midodrine here and this is now off.  Keep even on HD (previously on anti-hypertensives at home), has not tolerated UF here lately.  8. Anemia  - aranesp increased to 150 mcg for 9/29 onward for now. Hold OP Fe load.  s/p PRBC's 9/29 and may need again soon  9.  Metabolic bone disease - off renvela due to hypophosphatemia and improved last check.  Anticipate need to resume at a lower dose per trends. on calcitriol - corrected Ca ok 10.  DM-per primary 11.  Low plts - 2nd level testing for HIT was negative.  4   H/o HCV/ liver cirrhosis - hep C testing here was negative. Per pmd will need GI f/u in OP setting.  13.   Loose stools/ sacral wound - flexiseal in place now 14.  Dispo - awaiting SNF placement  Kelly Splinter, MD 03/09/2020, 4:41 PM       Recent Labs  Lab 03/05/20 1043 03/05/20 1043 03/08/20 0808 03/09/20 1031  K 4.0  --  3.8  --   BUN 49*  --  40*  --  CREATININE 6.92*  --  8.04*  --   CALCIUM 8.2*  --  7.7*  --   PHOS  --   --  5.1*  --   HGB 8.1*   < > 5.9* 8.8*   < > = values in this interval not displayed.   Inpatient medications: . sodium chloride   Intravenous Once  . sodium chloride   Intravenous Once  . calcitRIOL  1.5 mcg Oral Q M,W,F-HD  . chlorhexidine  15 mL Mouth Rinse BID  . Chlorhexidine Gluconate Cloth  6 each Topical Q0600  . collagenase   Topical Daily  . darbepoetin (ARANESP) injection - DIALYSIS  150 mcg Intravenous Q Wed-HD  . doxycycline  100 mg Oral Q12H  . feeding supplement (NEPRO CARB STEADY)  237 mL Oral BID BM  . insulin aspart  0-5 Units Subcutaneous QHS  . insulin aspart  0-9 Units Subcutaneous TID WC  . mouth rinse  15 mL Mouth Rinse q12n4p  . multivitamin  1 tablet Oral QHS  . pantoprazole  40 mg Oral Daily  . QUEtiapine  25 mg Oral BID  . thiamine  100 mg Oral Daily  . zinc sulfate  220 mg Oral Daily   . sodium chloride Stopped (02/21/20 2227)   sodium chloride, haloperidol lactate, lip balm, loperamide, metoprolol tartrate,  ondansetron **OR** ondansetron (ZOFRAN) IV, Resource ThickenUp Clear   Sol Blazing, MD 03/09/2020  4:41 PM

## 2020-03-09 NOTE — Progress Notes (Signed)
Received order for [employer]. Called staffing and they do not currently have one for pt. Will let us know if one becomes available.

## 2020-03-09 NOTE — Consult Note (Signed)
  [name]. 69 y.o., male with history of  hypertension, hyperlipidemia,.  End-stage renal on hemodialysis , Type 2 diabetes, colon cancer status post resection and anemia of chronic disease.  Patient admitted to hospital after present with complaints of mental confusion and then diagnosed COVID 19+.  This provider attempted to see patient vial tele psych for psychiatric evaluation today.  On evaluation patient reports that he has not prior psychiatric history and has never been admitted to a psychiatric hospital.  Patient also denied suicidal ideation.  Patient then states "you are asking to many questions and I don't have anything else to say." and shuts off the monitor ending the connection.  I call back and nurse answers given the iPad back to patient who states "Can I call you back I got to go to the bathroom" Patient informed that I only needed to ask 4 or 5 more questions.  Patient stated "I got only one answer BYE" and again disconnected.        Unable to complete psychiatric assessment but it does not appear that patient has a psychiatric history and no records indicating that patient has had psychiatric treatment in the past.  Patient also denies psychiatric history.    EKG 02/10/20 QTc 439         03/07/20 QTc 476  (Prolong)  Recommend that patient Seroquel be increased to 25 mg Bid.  and patient has Haldol 5 mg Q 6 hrs prn for agitation. (keep close eye on EKG for QTc prolongation)   At this point it does not appear that further psychiatric consult is needed.    Disposition:   No evidence of imminent risk to self or others at present.   Patient does not meet criteria for psychiatric inpatient admission.

## 2020-03-10 DIAGNOSIS — D638 Anemia in other chronic diseases classified elsewhere: Secondary | ICD-10-CM | POA: Diagnosis not present

## 2020-03-10 DIAGNOSIS — D696 Thrombocytopenia, unspecified: Secondary | ICD-10-CM | POA: Diagnosis not present

## 2020-03-10 DIAGNOSIS — G9341 Metabolic encephalopathy: Secondary | ICD-10-CM | POA: Diagnosis not present

## 2020-03-10 LAB — TYPE AND SCREEN
ABO/RH(D): AB POS
Antibody Screen: NEGATIVE
Unit division: 0
Unit division: 0

## 2020-03-10 LAB — CBC
HCT: 24.9 % — ABNORMAL LOW (ref 39.0–52.0)
Hemoglobin: 7.9 g/dL — ABNORMAL LOW (ref 13.0–17.0)
MCH: 27.1 pg (ref 26.0–34.0)
MCHC: 31.7 g/dL (ref 30.0–36.0)
MCV: 85.6 fL (ref 80.0–100.0)
Platelets: 139 10*3/uL — ABNORMAL LOW (ref 150–400)
RBC: 2.91 MIL/uL — ABNORMAL LOW (ref 4.22–5.81)
RDW: 19.6 % — ABNORMAL HIGH (ref 11.5–15.5)
WBC: 7.1 10*3/uL (ref 4.0–10.5)
nRBC: 0 % (ref 0.0–0.2)

## 2020-03-10 LAB — GLUCOSE, CAPILLARY
Glucose-Capillary: 72 mg/dL (ref 70–99)
Glucose-Capillary: 105 mg/dL — ABNORMAL HIGH (ref 70–99)
Glucose-Capillary: 103 mg/dL — ABNORMAL HIGH (ref 70–99)
Glucose-Capillary: 86 mg/dL (ref 70–99)

## 2020-03-10 LAB — BPAM RBC
Blood Product Expiration Date: 202110312359
Blood Product Expiration Date: 202111012359
ISSUE DATE / TIME: 202110061708
ISSUE DATE / TIME: 202110070253
Unit Type and Rh: 8400
Unit Type and Rh: 8400

## 2020-03-10 LAB — RENAL FUNCTION PANEL
Albumin: 2 g/dL — ABNORMAL LOW (ref 3.5–5.0)
Anion gap: 10 (ref 5–15)
BUN: 35 mg/dL — ABNORMAL HIGH (ref 8–23)
CO2: 25 mmol/L (ref 22–32)
Calcium: 7.5 mg/dL — ABNORMAL LOW (ref 8.9–10.3)
Chloride: 103 mmol/L (ref 98–111)
Creatinine, Ser: 8.71 mg/dL — ABNORMAL HIGH (ref 0.61–1.24)
GFR, Estimated: 6 mL/min — ABNORMAL LOW (ref >60)
Glucose, Bld: 121 mg/dL — ABNORMAL HIGH (ref 70–99)
Phosphorus: 4.9 mg/dL — ABNORMAL HIGH (ref 2.5–4.6)
Potassium: 4.2 mmol/L (ref 3.5–5.1)
Sodium: 138 mmol/L (ref 135–145)

## 2020-03-10 MED ORDER — ALBUMIN HUMAN 25 % IV SOLN
INTRAVENOUS | Status: AC
  Filled 2020-03-10: qty 100

## 2020-03-10 MED ORDER — HALOPERIDOL LACTATE 5 MG/ML IJ SOLN
2.5000 mg | Freq: Four times a day (QID) | INTRAMUSCULAR | Status: DC | PRN
  Administered 2020-03-13 – 2020-03-19 (×7): 2.5 mg via INTRAMUSCULAR
  Filled 2020-03-10 (×8): qty 1

## 2020-03-10 MED ORDER — HEPARIN SODIUM (PORCINE) 1000 UNIT/ML IJ SOLN
INTRAMUSCULAR | Status: AC
  Filled 2020-03-10: qty 4

## 2020-03-10 MED ORDER — HALOPERIDOL LACTATE 5 MG/ML IJ SOLN: 2.0000 mg | Freq: Once | INTRAMUSCULAR | Status: DC

## 2020-03-10 MED ORDER — HALOPERIDOL LACTATE 5 MG/ML IJ SOLN: 5.0000 mg | Freq: Once | INTRAMUSCULAR | Status: DC

## 2020-03-10 NOTE — Progress Notes (Signed)
  Speech Language Pathology Treatment: Dysphagia  Patient Details Name: [name]. MRN: [id number] DOB: 09-Jul-1950 Today's Date: 03/10/2020 Time: 3710-6269 SLP Time Calculation (min) (ACUTE ONLY): 9 min  Assessment / Plan / Recommendation Clinical Impression  Pt tolerated upgraded texture of Dys 3, consecutive sips thin without s/s aspiration. Oral phase was unremarkable. No shortness of breath. He has approximately 5 mandibular teeth therefore recommend continue Dys 3/thin. No further ST needed.    HPI HPI: [name] Chain. is a 69 y.o. male with medical history significant of GERD, hypertension, hyperlipidemia ESRD on hemodialysis, type 2 diabetes mellitus, anemia of chronic disease, colon cancer status post resection, epidural abscess status post laminectomy, posterior cervical fusion on 01/06/2020 and MRSA bacteremia and presented emergently for evaluation of confusion and COVID-19 positive. Initial BSE 02/21/20 was limited- needed enouragement, audible swallow, eructation, poor dentition and reduced endurance, Work of breathing was stable. Dys 2/thin recommended. On 9/22 became unresponsive after HD, transferred to ICU and intubated then extubated 9/25. Currently has NGT, encephalopathy improving per MD note and speech to re-eval. CXR Bilateral basilar infiltrates suggesting multifocal pneumonia.      SLP Plan  All goals met;Discharge SLP treatment due to (comment)       Recommendations  Diet recommendations: Dysphagia 3 (mechanical soft);Thin liquid Liquids provided via: Cup;Straw Medication Administration: Whole meds with liquid Supervision: Patient able to self feed;Intermittent supervision to cue for compensatory strategies Compensations: Slow rate;Small sips/bites;Clear throat intermittently Postural Changes and/or Swallow Maneuvers: Seated upright 90 degrees                Oral Care Recommendations: Oral care BID Follow up Recommendations: 24 hour  supervision/assistance SLP Visit Diagnosis: Dysphagia, unspecified (R13.10) Plan: All goals met;Discharge SLP treatment due to (comment)       GO                Houston Siren 03/10/2020, 11:40 AM

## 2020-03-10 NOTE — Progress Notes (Signed)
[facility] Progress Note  Subjective:    Patient not examined today directly given COVID-19 + status, utilizing data taken from chart +/- discussions w/ providers and staff.   Vitals:   03/10/20 0500 03/10/20 0511 03/10/20 0817 03/10/20 1422  BP:  (!) 164/79  (!) 162/74  Pulse:  86 92 87  Resp:  18    Temp:  99.1 F (37.3 C) 98.6 F (37 C) 97.6 F (36.4 C)  TempSrc:  Oral Oral Oral  SpO2:  94% 98% 100%  Weight: 85.3 kg     Height:        Exam:  Patient not examined today directly given COVID-19 + status, utilizing data taken from chart +/- discussions w/ providers and staff.         CXR 9/23 - patchy perihilar/ basilar dz, no edema      OP HD: [facility] MWF   4h 35min  87kg  2/2 bath  TDC / L AVF  Hep none  -Mircera 150 mcg IV q 2 weeks (last dose 02/09/2020) -Venofer 100 mg IV X 8 doses (not started yet) -Calcitriol 1.5 mcg PO TIW  Assessment/ Plan: 1. COVID 19-recent diagnosis. Tested positive at [facility] 09/18 and again here.  Per primary team. COVID isolation ends on 03/12/20, which is this Sunday.  2. Acute hypoxic resp failure.  At one point required mechanical ventilation. Resolved.  3. AMS - better now, responsive and interacting, but continues to refuse some medical Rx's intermittently. Seen by psych, able to make his own decisions.  4. Recent epidural abscess/MRSA cervical discitis-  S/p decompression surgery C4-T2 on 01/06/20. S/p Vancomycin and Rifampin here through 10/01 but rifampin dc'd early due to ^LFT"s. PO doxy started on 10/01 and pt is now off IV vanc.   5.  ESRD - MWF HD. HIT testing was negative. OK to use heparin to block catheter now. Signed off on HD early Wed. Will postpone today's HD until tomorrow for staffing issues.   6.  HD access - L BC AVF placed Feb 2021. Was used starting in May and had infiltration in Aug prompting [facility] placement to rest the AVF. Have used AVF w/o issues on last 3 HD sessions. We are considering TDC removal, but due to  lack of good IV access will keep in for now.  7.  Hypertension/volume - Previously on high dose midodrine here and this is now off.  Keep even on HD (previously on anti-hypertensives at home), has not tolerated UF here lately.  8. Anemia  - aranesp increased to 150 mcg for 9/29 onward for now. Hold OP Fe load.  s/p PRBC's 9/29 and may need again soon  9.  Metabolic bone disease - off renvela due to hypophosphatemia and improved last check.  Anticipate need to resume at a lower dose per trends. on calcitriol - corrected Ca ok 10.  DM-per primary 11.  Low plts - 2nd level testing for HIT was negative.  70   H/o HCV/ liver cirrhosis - hep C testing here was negative. Per pmd will need GI f/u in OP setting.  13.   Loose stools/ sacral wound - flexiseal in place now 14.  Dispo - awaiting SNF placement  Kelly Splinter, MD 03/10/2020, 3:31 PM       Recent Labs  Lab 03/08/20 0808 03/08/20 0808 03/09/20 1031 03/10/20 1109  K 3.8  --   --  4.2  BUN 40*  --   --  35*  CREATININE 8.04*  --   --  8.71*  CALCIUM 7.7*  --   --  7.5*  PHOS 5.1*  --   --  4.9*  HGB 5.9*   < > 8.8* 7.9*   < > = values in this interval not displayed.   Inpatient medications: . calcitRIOL  1.5 mcg Oral Q M,W,F-HD  . chlorhexidine  15 mL Mouth Rinse BID  . Chlorhexidine Gluconate Cloth  6 each Topical Q0600  . collagenase   Topical Daily  . darbepoetin (ARANESP) injection - DIALYSIS  150 mcg Intravenous Q Wed-HD  . doxycycline  100 mg Oral Q12H  . feeding supplement (NEPRO CARB STEADY)  237 mL Oral BID BM  . insulin aspart  0-5 Units Subcutaneous QHS  . insulin aspart  0-9 Units Subcutaneous TID WC  . mouth rinse  15 mL Mouth Rinse q12n4p  . multivitamin  1 tablet Oral QHS  . pantoprazole  40 mg Oral Daily  . QUEtiapine  25 mg Oral BID  . thiamine  100 mg Oral Daily  . zinc sulfate  220 mg Oral Daily    haloperidol lactate, lip balm, loperamide, metoprolol tartrate, ondansetron **OR** ondansetron (ZOFRAN)  IV, Resource ThickenUp Clear   Sol Blazing, MD 03/10/2020  3:31 PM

## 2020-03-10 NOTE — Progress Notes (Addendum)
Patient refused a.m. care including CHG bath, this RN tried to educate patient he responded " save your breath am not doing it."

## 2020-03-10 NOTE — Progress Notes (Signed)
Occupational Therapy Treatment Patient Details Name: [name]. MRN: [id number] DOB: Nov 24, 1950 Today's Date: 03/10/2020    History of present illness Pt is a 69 y.o. male with d/c to SNF after prolonged complicated admission s/p C4-T2 laminectomy fusion (01/06/20), now admitted from [facility] 02/20/20 with confusion. Workup for metabolic encephalopathy, shock hypoxia; (+) COVID-19. 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 ETT 9/22-9/25. PMH includes HTN, HLD, ESRD (HD MWF), colon CA s/p resection.   OT comments  OT session focused on attention and sequencing during simple ADL tasks. Pt continues to be restless and impulsive requiring moderate cues to attend to grooming tasks, Min A overall for sequencing bimanual tasks with difficulty problem solving. With breakfast, pt able to attend to entire task at Supervision level. Pt did require frequent educations and assistance in repositioning to maintain safety while self feeding. Plan to progress attention to tasks and OOB activities during next session. VSS on RA.    Follow Up Recommendations  SNF;Supervision/Assistance - 24 hour    Equipment Recommendations  None recommended by OT    Recommendations for Other Services      Precautions / Restrictions Precautions Precautions: Fall;Other (comment) Precaution Comments: Rectal tube,  Restrictions Weight Bearing Restrictions: No       Mobility Bed Mobility Overal bed mobility: Needs Assistance             General bed mobility comments: Max A for scooting up in bed during session. Though noted to sit EOB without assistance attempting to get out of bed later. OT and RN assisting pt back to bed  Transfers                      Balance                                           ADL either performed or assessed with clinical judgement   ADL Overall ADL's : Needs assistance/impaired Eating/Feeding:  Supervision/ safety;Bed level Eating/Feeding Details (indicate cue type and reason): Supervision for safety and cues for best positioning as pt requesting head to be put back down.. Once repositioned, wiggled down to foot of bed again Grooming: Minimal assistance;Bed level;Wash/dry face;Oral care Grooming Details (indicate cue type and reason): Min A overall for grooming tasks bed level, pt required cues for thoroughness of washing face. Min A for oral care with assistance needed sequencing and with bimanual tasks                               General ADL Comments: Pt continues to require extensive assist for ADLs due to cognition and physical debility      Vision   Vision Assessment?: No apparent visual deficits   Perception     Praxis      Cognition Arousal/Alertness: Awake/alert Behavior During Therapy: Restless;Impulsive Overall Cognitive Status: Impaired/Different from baseline Area of Impairment: Attention;Memory;Following commands;Safety/judgement;Awareness;Problem solving                 Orientation Level: Situation;Time;Place Current Attention Level: Focused Memory: Decreased short-term memory Following Commands: Follows one step commands with increased time;Follows one step commands inconsistently Safety/Judgement: Decreased awareness of safety;Decreased awareness of deficits Awareness: Intellectual Problem Solving: Slow processing;Difficulty sequencing;Requires verbal cues;Requires tactile cues General Comments: pt perseverating on putting head of  bed back down. Highly distractible and impulsive - fidgeting with lines        Exercises     Shoulder Instructions       General Comments Session focused on attention and sequencing during simple ADL tasks. Pt noted to sustain focus to eat entire breakfast tray though difficulty with grooming tasks. Suspect based on pt's intrinsic motivation to complete certain tasks    Pertinent Vitals/ Pain        Pain Assessment: Faces Faces Pain Scale: Hurts a little bit Pain Location: generalized Pain Descriptors / Indicators: Grimacing;Guarding Pain Intervention(s): Limited activity within patient's tolerance;Monitored during session;Repositioned  Home Living                                          Prior Functioning/Environment              Frequency  Min 2X/week        Progress Toward Goals  OT Goals(current goals can now be found in the care plan section)  Progress towards OT goals: Progressing toward goals  Acute Rehab OT Goals Patient Stated Goal: "I want to go home" OT Goal Formulation: With patient Time For Goal Achievement: 03/21/20 Potential to Achieve Goals: Fair ADL Goals Pt Will Perform Eating: with set-up;with supervision;sitting;bed level Pt Will Perform Grooming: with set-up;with supervision;sitting;bed level Additional ADL Goal #1: Pt will perform bed mobility with Min A in preparation for ADLs Additional ADL Goal #2: Pt will follow one step commands during ADLS 50% of time with Min cues Additional ADL Goal #3: Pt will maintain sitting balance at EOB with Min Guard A for ~10 minutes in preparation for ADLs  Plan Discharge plan remains appropriate    Co-evaluation                 AM-PAC OT "6 Clicks" Daily Activity     Outcome Measure   Help from another person eating meals?: A Little Help from another person taking care of personal grooming?: A Little Help from another person toileting, which includes using toliet, bedpan, or urinal?: Total Help from another person bathing (including washing, rinsing, drying)?: A Lot Help from another person to put on and taking off regular upper body clothing?: A Lot Help from another person to put on and taking off regular lower body clothing?: Total 6 Click Score: 12    End of Session    OT Visit Diagnosis: Unsteadiness on feet (R26.81);Other abnormalities of gait and mobility  (R26.89);Muscle weakness (generalized) (M62.81)   Activity Tolerance Other (comment) (limited by cognition)   Patient Left in bed;with call bell/phone within reach;with bed alarm set;with nursing/sitter in room   Nurse Communication Mobility status        Time: 3546-5681 OT Time Calculation (min): 20 min  Charges: OT General Charges $OT Visit: 1 Visit OT Treatments $Self Care/Home Management : 8-22 mins  Layla Maw, OTR/L   Layla Maw 03/10/2020, 9:54 AM
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
## 2020-03-10 NOTE — Progress Notes (Signed)
Patient pulled flexiseal out.

## 2020-03-10 NOTE — Progress Notes (Signed)
Patient refused his morning meds.

## 2020-03-10 NOTE — Progress Notes (Addendum)
PROGRESS NOTE                                                                                                                                                                                                             Patient Demographics:    [name], is a 69 y.o. male, DOB - 06-Sep-1950, CRF:[id number]  Outpatient Primary MD for the patient is [facility]   Admit date - 02/20/2020   LOS - 97  Chief Complaint  Patient presents with  . Covid Positive  . AMS       Brief Narrative: Patient is a 69 y.o. male with PMHx of ESRD on HD MWF, DM-2, MRSA bacteremia with epidural abscess s/p C4-T2 laminectomy/fusion on 01/06/2020-on IV vancomycin/rifampin through 10/1-resident of SNF-apparently tested positive for COVID-19 on 9/18-subsequently brought to the ED on 9/19 for worsening confusion (more than baseline)-fever-thought to have acute metabolic encephalopathy from COVID-19 infection and admitted to the hospitalist service.  Further hospital course complicated by development of hypotension/severe encephalopathy requiring intubation and ICU transfer.  See below for further details.  COVID-19 vaccinated status: Unvaccinated  Significant Events: 8/3-8/23>> hospitalized for epidural abscess/MRSA bacteremia-underwent laminectomy-briefly intubated due to altered mental status.  Discharged to SNF 9/18>> COVID-19 positive at SNF 9/19>> Admit to [facility] for fever/confusion 9/22>> obtunded/hypotensive after coming back from hemodialysis-emergently intubated/transferred to ICU 9/30>> transferred back to [facility] 9/30>> SQ heparin stopped-pharmacy consulted for HIT protocol.  Significant studies: 8/6>> Echo: EF> 75% 9/19>>Chest x-ray: No acute abnormality in the lung. 9/19>> CT head: No acute intracranial abnormality 9/19>> CT C-spine: Postoperative findings of posterior laminectomy/fusion C4-T1, prevertebral soft tissue edema  appears reduced, postoperative fluid collection overlies laminectomy site does not appear significantly changed compared to prior examination. 9/20>> MRI C-spine: Attempted but due to AMS-not completed 9/21>> MRI C Spine: Osteomyelitis of skull base, anterior C1 and odontoid, C2-C3 facet joint-associated erosion of bone since 8/27-resolved epidural abscess 9/22>> coarsened/heterogeneous appearance of the liver parenchyma-suspicious for cirrhosis, gallbladder not visualized. 9/22>> bilateral lower extremity Doppler: No DVT 9/29>> chest x-ray: Bibasilar infiltrates suggesting multifocal pneumonia  COVID-19 medications: Remdesivir: 9/19>> 9/21  Antibiotics: IV vancomycin plan to continue through October 1 (rifampin stopped 9/21 due to elevated LFTs) Ancef: 9/26>> Zosyn: 9/24>> 9/26  Microbiology data: 9/20 >>blood culture: No growth so far 9/21>> blood culture: No growth so far 9/24>> sputum  culture: E. coli/MSSA  Procedures: ET tube 9/22 > 9/25  Consults: Nephrology PCCM GI  DVT prophylaxis: heparin injection 5,000 Units Start: 02/20/20>> stopped on 9/30 due to worsening thrombocytopenia. SCDs Start: 02/20/20 1628   Subjective:   Nursing reports noted.  Patient has been refusing his medications, blood draws, CBG checks.  He does not give a clear-cut reason for why he is doing this.  He does understand the his condition may worsen if he continues to refuse treatments.   Assessment  & Plan :   Acute hypoxic respiratory failure secondary to HCAP (E. coli/MSSA in sputum culture) and COVID-19 pneumonia Initially admitted and thought to have mild disease. Started on steroid/Remdesivir. But decompensated on 9/22 requiring intubation and ICU transfer.  Extubated 9/26.  Found to have MSSA in the sputum culture.  Given intravenous cefazolin till 10/1. Remdesivir stopped on 9/22 due to significantly elevated LFTs.    Respiratory status remained stable.  He is saturating normal on room  air. Tested positive on 9/19.  He can end isolation on 10/10.  Fall Patient sustained a fall yesterday morning.  No injuries were noted.  CT scan of the head and neck unremarkable for any acute process.  He does have excoriation in the posterior right scalp.  No active bleeding noted.  Acute metabolic encephalopathy Multifactorial etiology-on initial presentation to the hospital was felt to be due to uremia (due to under dialysis, patient got short outpatient HD numerous times), sepsis physiology from COVID-19. However his mental status markedly deteriorated on 9/22 likely due to worsening sepsis physiology from HCAP.  Post extubation has had issues with delirium/likely related to intubation/ICU stay.  Required Precedex while in the ICU.  Patient has no longer encephalopathic but has been refusing treatments and medications and blood draws.  He was started on Seroquel.  Haldol as needed.  Psychiatry was consulted however they have been trying only televisits due to his COVID-19 status.  He has not been fully cooperative with them.  Psychiatry did recommend increasing dose of Seroquel.  Patient will come off isolation on 10/10 and maybe psychiatry can do face to face visit at that time.  Patient refuses to accept treatment plan and medications.  He does not understand the nature of his disease process and the need for hemodialysis.  He lacks an appreciation of the nature of his underlying medical condition.  He is unable to rationally manipulate information to make an informed decision.  I believe that the patient does NOT have the capacity to make decisions for himself at this time.  He apparently has short-term memory issues. There was some thought as to whether patient has underlying dementia. Has a history of alcohol/cocaine use in the past.  Continue thiamine.  Dysphagia He was getting tube feedings however the tube came out on 10/3.  He was started on a dysphagia diet.  Seems to have improved.   Tolerating diet better than before.  Elevated D-dimer Secondary to COVID-19.  He did not have any significant hypoxia.  Lower extremity Dopplers were negative.  Since he has clinically improved will not pursue any further work-up.  D-dimer was 6.16 on October 1.  Clinically has improved.    History of MRSA bacteremia with epidural abscess requiring C4-T2 laminectomy:  MRI C-spine completed on 9/21. Appears to have residual osteomyelitis of the skull base area. Was discussed with neurosurgeon Dr. Kathyrn Sheriff on 9/21. No further surgical intervention required.  Plans were to continue vancomycin/rifampin through October 1-however rifampin discontinued due  to significantly elevated LFTs.  Patient now off of vancomycin.  Started on doxycycline from 10/1.  Patient will need follow-up with infectious disease clinic post discharge.   Will also need follow-up with neurosurgery.  Continues to have staples in his neck area.  Septic shock, resolved Required pressors while in the ICU. Thought to be most likely secondary to HCAP although patient has a history of recent MRSA bacteremia/cervical spine osteomyelitis/abscess.   Thrombocytopenia/HIT has been ruled out Suspect this is multifactorial in etiology from acute illness/sepsis/thrombocytopenia-potentially some contribution from underlying liver cirrhosis-however significant drop from what he first presented with-hence HIT was in the differential Subcutaneous heparin was discontinued.  HIT panel was ordered..  Patient started on bivalirudin in the interim.   Patient has been refusing blood draws.  IV access was lost.  He refused another IV access.  Bivalirudin was discontinued since patient was not being cooperative with IV access and blood draws.   The heparin-induced platelet antibody is 0.60.  Slightly above the normal range.  Serotonin release assay is normal. Patient's 4 T score was 6.  The heparin-induced antibody is 0.6 as discussed above.  With  serotonin assay being normal this is unlikely to be HIT.   Platelet counts have improved to 150,000.  Normocytic Anemia Secondary to ESRD. Worsened by acute illness-s/p 1 unit of PRBC on 9/29.   Hemoglobin noted to be 5.9 on 10/6 2 units of PRBC ordered however patient cut short his dialysis treatment.  He was refusing another IV access placement.  Subsequently the dialysis catheter was accessed and blood was transfused through it. Hemoglobin improved to 8.8 as of yesterday. No overt bleeding noted.  Shock liver LFTs have normalized.    History of HCV/liver cirrhosis HCV antibody positive in the past.  Viral load was done and no hepatitis C quantitative was detected.   Liver ultrasound with suggestion of cirrhotic appearance.  Will need outpatient GI follow-up.  DM-2 (A1c 6.5 on 8/11):  Appears to have brittle diabetes resulting in hypoglycemic episodes while in the ICU.  CBGs noted to be borderline low.  Discontinued Levemir to avoid hypoglycemia.    ESRD on HD MWF Nephrology following.  Patient being dialyzed as per his usual schedule.  Patient has been curtailing his dialysis treatment.  Has not been cooperative.  Sacral wound/loose stools Wound care is following for sacral wounds.  Currently has a Flexi-Seal.  Mainly present to protect the wounds that he has in the lower back area.   Imodium as needed.  Hopefully we can discontinue the Flexi-Seal if his stool output decreases.    Metabolic acidosis Likely secondary to incomplete HD treatments. Resolved with regular HD sessions.  Hyperkalemia Likely secondary to incomplete dialysis treatments, resolved with hemodialysis  Essential hypertension  Tolerate higher blood pressure in this patient who undergoes dialysis.  Continue to monitor.    Hyperlipidemia Statin on hold-should be able to resume when closer to discharge.  Questionable dementia Discussed with patient's daughter.  She is not certain if patient truly has cognitive  impairment or dementia.  Psychiatry consulted.  Deconditioning/debility: Secondary to acute illness on top of chronic debility.  PT eval completed-SNF on discharge.  GI prophylaxis: PPI  Condition - Stable  Family Communication  :  Daughter Tetsuo Coppola [phone])  Code Status :  Full Code  Diet :  Diet Order            DIET DYS 3 Room service appropriate? No; Fluid consistency: Thin  Diet  effective now                  Disposition Plan  : Plan is for the patient to go to skilled nursing facility when medically improved.  His refusal of medical treatment has been a major challenge.  Status is: Inpatient  Remains inpatient appropriate because:Inpatient level of care appropriate due to severity of illness  Dispo: The patient is from: SNF              Anticipated d/c is to: SNF              Anticipated d/c date is: Hopefully by the end of this week              Patient currently is not medically stable to d/c.    Antimicorbials  :    Anti-infectives (From admission, onward)   Start     Dose/Rate Route Frequency Ordered Stop   03/06/20 2000  doxycycline (VIBRA-TABS) tablet 100 mg        100 mg Oral Every 12 hours 03/06/20 1632     03/04/20 1000  doxycycline (VIBRA-TABS) tablet 100 mg  Status:  Discontinued        100 mg Oral Every 12 hours 03/01/20 0944 03/04/20 0741   03/04/20 1000  doxycycline (VIBRA-TABS) tablet 100 mg  Status:  Discontinued        100 mg Per Tube Every 12 hours 03/04/20 0741 03/06/20 1632   02/27/20 1500  ceFAZolin (ANCEF) IVPB 1 g/50 mL premix        1 g 100 mL/hr over 30 Minutes Intravenous Every 24 hours 02/27/20 1433 03/02/20 1445   02/25/20 1000  piperacillin-tazobactam (ZOSYN) IVPB 2.25 g  Status:  Discontinued        2.25 g 100 mL/hr over 30 Minutes Intravenous Every 8 hours 02/25/20 0905 02/27/20 1433   02/25/20 0830  piperacillin-tazo (ZOSYN) NICU IV syringe 225 mg/mL  Status:  Discontinued        100 mg/kg of piperacillin  84.3  kg 84.4 mL/hr over 30 Minutes Intravenous Every 8 hours 02/25/20 0825 02/25/20 0905   02/25/20 0814  vancomycin (VANCOCIN) 1-5 GM/200ML-% IVPB       Note to Pharmacy: Cherylann Banas   : cabinet override      02/25/20 0814 02/25/20 1158   02/23/20 0642  vancomycin (VANCOCIN) 1-5 GM/200ML-% IVPB       Note to Pharmacy: [place], [place] : cabinet override      02/23/20 0642 02/23/20 1859   02/21/20 1200  vancomycin (VANCOCIN) IVPB 1000 mg/200 mL premix        1,000 mg 200 mL/hr over 60 Minutes Intravenous Every M-W-F (Hemodialysis) 02/20/20 1959 03/03/20 1130   02/21/20 1000  remdesivir 100 mg in sodium chloride 0.9 % 100 mL IVPB  Status:  Discontinued       "Followed by" Linked Group Details   100 mg 200 mL/hr over 30 Minutes Intravenous Daily 02/20/20 1632 02/23/20 0633   02/20/20 2000  rifampin (RIFADIN) capsule 300 mg  Status:  Discontinued        300 mg Oral Every 12 hours 02/20/20 1907 02/23/20 0633   02/20/20 1730  remdesivir 200 mg in sodium chloride 0.9% 250 mL IVPB       "Followed by" Linked Group Details   200 mg 580 mL/hr over 30 Minutes Intravenous Once 02/20/20 1632 02/20/20 1913      Inpatient Medications  Scheduled Meds: . calcitRIOL  1.5 mcg  Oral Q M,W,F-HD  . chlorhexidine  15 mL Mouth Rinse BID  . Chlorhexidine Gluconate Cloth  6 each Topical Q0600  . collagenase   Topical Daily  . darbepoetin (ARANESP) injection - DIALYSIS  150 mcg Intravenous Q Wed-HD  . doxycycline  100 mg Oral Q12H  . feeding supplement (NEPRO CARB STEADY)  237 mL Oral BID BM  . insulin aspart  0-5 Units Subcutaneous QHS  . insulin aspart  0-9 Units Subcutaneous TID WC  . mouth rinse  15 mL Mouth Rinse q12n4p  . multivitamin  1 tablet Oral QHS  . pantoprazole  40 mg Oral Daily  . QUEtiapine  25 mg Oral BID  . thiamine  100 mg Oral Daily  . zinc sulfate  220 mg Oral Daily   Continuous Infusions: . sodium chloride Stopped (02/21/20 2227)   PRN Meds:.sodium chloride, haloperidol  lactate, lip balm, loperamide, metoprolol tartrate, ondansetron **OR** ondansetron (ZOFRAN) IV, Resource ThickenUp Clear    Bonnielee Haff M.D on 03/10/2020 at 11:20 AM  To page go to [email or web address] - use universal password  [facility] -  Office  [phone]    Objective:   Vitals:   03/09/20 0730 03/10/20 0500 03/10/20 0511 03/10/20 0817  BP: (!) 123/52  (!) 164/79   Pulse: 96  86 92  Resp: 18  18   Temp: 98.6 F (37 C)  99.1 F (37.3 C) 98.6 F (37 C)  TempSrc: Oral  Oral Oral  SpO2: 97%  94% 98%  Weight:  85.3 kg    Height:        Wt Readings from Last 3 Encounters:  03/10/20 85.3 kg  01/28/20 86.2 kg  01/24/20 89.4 kg     Intake/Output Summary (Last 24 hours) at 03/10/2020 1120 Last data filed at 03/10/2020 0600 Gross per 24 hour  Intake 240 ml  Output 0 ml  Net 240 ml     Physical Exam  General appearance: Awake alert.  In no distress.  Remains distracted Resp: Clear to auscultation bilaterally.  Normal effort Cardio: S1-S2 is normal regular.  No S3-S4.  No rubs murmurs or bruit GI: Abdomen is soft.  Nontender nondistended.  Bowel sounds are present normal.  No masses organomegaly Extremities: No edema.  Full range of motion of lower extremities. Neurologic: Oriented to place year.  No focal neurological deficits.       Data Review:    CBC Recent Labs  Lab 03/04/20 1555 03/05/20 1043 03/08/20 0808 03/09/20 1031  WBC 9.5 8.3 7.8 11.5*  HGB 7.6* 8.1* 5.9* 8.8*  HCT 24.3* 25.7* 18.4* 26.7*  PLT 90* 94* 127* 150  MCV 85.6 85.1 84.0 84.8  MCH 26.8 26.8 26.9 27.9  MCHC 31.3 31.5 32.1 33.0  RDW 22.9* 22.8* 22.4* 19.7*    Chemistries  Recent Labs  Lab 03/04/20 1555 03/05/20 1043 03/08/20 0808  NA 134* 138 139  K 3.9 4.0 3.8  CL 99 101 103  CO2 25 23 23   GLUCOSE 166* 134* 83  BUN 38* 49* 40*  CREATININE 5.89* 6.92* 8.04*  CALCIUM 7.9* 8.2* 7.7*  AST 32 30  --   ALT 8 9  --   ALKPHOS 98 93  --   BILITOT 1.0 1.3*  --      Radiology Reports CT HEAD WO CONTRAST  Result Date: 03/09/2020 CLINICAL DATA:  Status post fall. EXAM: CT HEAD WITHOUT CONTRAST CT CERVICAL SPINE WITHOUT CONTRAST TECHNIQUE: Multidetector CT imaging of the head and cervical spine was performed  following the standard protocol without intravenous contrast. Multiplanar CT image reconstructions of the cervical spine were also generated. COMPARISON:  February 20, 2020. FINDINGS: CT HEAD FINDINGS Brain: Mild chronic ischemic white matter disease. No mass effect or midline shift is noted. Ventricular size is within normal limits. There is no evidence of mass lesion, hemorrhage or acute infarction. Vascular: No hyperdense vessel or unexpected calcification. Skull: Normal. Negative for fracture or focal lesion. Sinuses/Orbits: No acute finding. Other: None. CT CERVICAL SPINE FINDINGS Alignment: Normal. Skull base and vertebrae: Stable erosive changes are seen involving the dens and anterior arch of C1 most consistent with degenerative change. Status post laminectomy extending from C4 to T1. Status post surgical posterior fusion extending from C4 to T1 with bilateral intrapedicular screw placement at all levels except C7. Soft tissues and spinal canal: No prevertebral fluid or swelling. No visible canal hematoma. Disc levels: Severe degenerative disc disease is noted at C6-7 and C7-T1. Upper chest: Some degree of pleural effusion is seen involving the right lung apex. Other: None. IMPRESSION: 1. Mild chronic ischemic white matter disease. No acute intracranial abnormality seen. 2. Postsurgical and degenerative changes as described above. No acute abnormality seen in the cervical spine. Electronically Signed   By: Marijo Conception M.D.   On: 03/09/2020 13:14   CT Head Wo Contrast  Result Date: 02/20/2020 CLINICAL DATA:  Delirium.  COVID-19 positive.  Recent neck surgery. EXAM: CT HEAD WITHOUT CONTRAST TECHNIQUE: Contiguous axial images were obtained from the base  of the skull through the vertex without intravenous contrast. COMPARISON:  January 28, 2020 FINDINGS: Brain: No subdural, epidural, or subarachnoid hemorrhage. A lacunar infarct in the right thalamus is stable and nonacute. Moderate white matter changes again identified. No acute cortical ischemia or acute infarct noted. No mass effect or midline shift. Ventricles and sulci are unchanged. Cerebellum, brainstem, and basal cisterns are normal. Vascular: Calcified atherosclerosis in the intracranial carotids. Skull: Normal. Negative for fracture or focal lesion. Sinuses/Orbits: Mild scattered mucosal thickening in the ethmoid air cells and left maxillary sinus. No air-fluid levels. Mastoid air cells and middle ears are well aerated. Other: None. IMPRESSION: 1. No acute intracranial abnormalities. Chronic white matter changes. Chronic right thalamic lacunar infarct. 2. Mild sinus disease as above. Electronically Signed   By: Dorise Bullion III M.D   On: 02/20/2020 14:49   CT CERVICAL SPINE WO CONTRAST  Result Date: 03/09/2020 CLINICAL DATA:  Status post fall. EXAM: CT HEAD WITHOUT CONTRAST CT CERVICAL SPINE WITHOUT CONTRAST TECHNIQUE: Multidetector CT imaging of the head and cervical spine was performed following the standard protocol without intravenous contrast. Multiplanar CT image reconstructions of the cervical spine were also generated. COMPARISON:  February 20, 2020. FINDINGS: CT HEAD FINDINGS Brain: Mild chronic ischemic white matter disease. No mass effect or midline shift is noted. Ventricular size is within normal limits. There is no evidence of mass lesion, hemorrhage or acute infarction. Vascular: No hyperdense vessel or unexpected calcification. Skull: Normal. Negative for fracture or focal lesion. Sinuses/Orbits: No acute finding. Other: None. CT CERVICAL SPINE FINDINGS Alignment: Normal. Skull base and vertebrae: Stable erosive changes are seen involving the dens and anterior arch of C1 most  consistent with degenerative change. Status post laminectomy extending from C4 to T1. Status post surgical posterior fusion extending from C4 to T1 with bilateral intrapedicular screw placement at all levels except C7. Soft tissues and spinal canal: No prevertebral fluid or swelling. No visible canal hematoma. Disc levels: Severe degenerative disc disease is noted  at C6-7 and C7-T1. Upper chest: Some degree of pleural effusion is seen involving the right lung apex. Other: None. IMPRESSION: 1. Mild chronic ischemic white matter disease. No acute intracranial abnormality seen. 2. Postsurgical and degenerative changes as described above. No acute abnormality seen in the cervical spine. Electronically Signed   By: Marijo Conception M.D.   On: 03/09/2020 13:14   CT Cervical Spine Wo Contrast  Result Date: 02/20/2020 CLINICAL DATA:  COVID positive, recent neck surgery EXAM: CT CERVICAL SPINE WITHOUT CONTRAST TECHNIQUE: Multidetector CT imaging of the cervical spine was performed without intravenous contrast. Multiplanar CT image reconstructions were also generated. COMPARISON:  CT cervical spine, 01/28/2020 FINDINGS: Alignment: Normal. Skull base and vertebrae: No acute fracture. No primary bone lesion. Erosive change of the dens and adjacent transverse process of C1 are not significantly changed compared to prior examination. Soft tissues and spinal canal: Assessment of the soft tissues is somewhat limited by metallic streak artifact and lack of intravenous contrast; within this limitation, no unexpected fluid collection overlying the laminectomy site. Prevertebral soft tissue edema appears reduced compared to prior examination. No visible canal hematoma. Disc levels: Status post posterior laminectomy and fusion of C4 through T1, with moderate disc space height loss and osteophytosis of C6-C7 and C7-T1. Upper chest: Negative. Other: None. IMPRESSION: 1. Redemonstrated postoperative findings status post posterior  laminectomy and fusion of C4 through T1, with moderate disc space height loss and osteophytosis of C6-C7 and C7-T1. 2. Prevertebral soft tissue edema appears reduced compared to prior examination. 3. Postoperative fluid collection overlying laminectomy site does not appear significantly changed compared to prior examination. 4. Assessment of the soft tissues and epidural space is generally limited on noncontrast CT and additionally limited by metallic streak artifact; contrast enhanced MRI is the test of choice for evaluation of edema, fluid collections, the epidural space, and discitis osteomyelitis if clinically suspected in the postoperative setting. Electronically Signed   By: Eddie Candle M.D.   On: 02/20/2020 14:53   MR CERVICAL SPINE WO CONTRAST  Result Date: 02/22/2020 CLINICAL DATA:  69 year old male with history of dorsal cervical epidural abscess, status post operative drainage and decompression last month on 01/06/2020. Follow-up MRI 01/29/2020 with new left occipital condyle, anterior C1 and odontoid marrow edema and erosion compatible with skull base osteomyelitis. End stage renal disease on dialysis. Confusion. Positive COVID-19. EXAM: MRI CERVICAL SPINE WITHOUT CONTRAST TECHNIQUE: Multiplanar, multisequence MR imaging of the cervical spine was performed. No intravenous contrast was administered. COMPARISON:  Recent CT head and cervical spine 02/20/2020. Postoperative MRI 01/29/2020. Preoperative MRI FINDINGS: The examination had to be discontinued just prior to completion due to confusion, agitation. Only axial GRE imaging was not obtained. Alignment: Straightening and mild reversal of cervical lordosis remains improved since 01/04/2020. Vertebrae: Abnormal marrow edema and/or T2 and STIR hyperintense soft tissue surrounding the odontoid (series 3, image 7) which has been partially eroded along with the adjacent clivus and anterior C1 ring as seen on the recent CT. Mild marrow edema in both  occipital condyles, greater on the left. Faint marrow edema also in the C2-C3 facets, mostly on the right (series 3, image 2). Overall the appearance has not progressed since 01/22/2020. Sequelae of posterior decompression and fusion from C3 through T1. Mild hardware susceptibility artifact. No other cervical or upper thoracic marrow edema or acute osseous abnormality. Cord: Suboptimal cord detail due to motion and hardware susceptibility. No definite cervical or upper thoracic spinal cord signal abnormality. Normalized epidural space since August.  Posterior Fossa, vertebral arteries, paraspinal tissues: Major vascular flow voids are preserved in the neck. Postoperative changes to the posterior neck soft tissues with no adverse features. Negative visible lung apices. Cervicomedullary junction and visible posterior fossa are stable and within normal limits. Disc levels: C2-C3: Improved thecal sac patency since 01/04/2020. Foraminal endplate spurring and moderate bilateral facet degeneration. Mild residual spinal stenosis. Mild bilateral C3 foraminal stenosis. C3-C4: Chronic central disc protrusion (series 2, image 7) with mild facet and ligament flavum hypertrophy. Improved thecal sac patency since 01/04/2020 but residual mild spinal stenosis and mild cord mass effect. Moderate bilateral C4 foraminal stenosis. C4-C5 through T1-T2: Posterior decompression with no significant spinal stenosis despite residual disc and endplate degeneration at some levels. There is mild to moderate bilateral C8 foraminal stenosis related to disc, endplate and residual facet hypertrophy at C7-T1. IMPRESSION: 1. 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 Possible involvement of the right C2-C3 facet joint also. Associated erosion of bone since 01/28/2020 as demonstrated by CT two days ago. But on MRI no other significant progression is identified since August; no new levels of involvement. 2. Resolved posterior epidural  abscess following surgery last month. Satisfactorily decompressed levels C4-C5 through T1-T2. 3. Improved thecal sac patency but mild residual degenerative spinal stenosis at both C2-C3 and C3-C4. No definite spinal cord signal abnormality. Electronically Signed   By: Genevie Ann M.D.   On: 02/22/2020 11:48   DG CHEST PORT 1 VIEW  Result Date: 03/01/2020 CLINICAL DATA:  Respiratory distress.  COVID. EXAM: PORTABLE CHEST 1 VIEW COMPARISON:  02/24/2020 FINDINGS: The endotracheal tube is been removed in the enteric tube converted for a feeding tube. Enteric tube tip is off the field of view but below the left hemidiaphragm. Right central venous catheter is unchanged in position. No pneumothorax. Cardiac enlargement. Bilateral basilar infiltrates suggesting multifocal pneumonia. IMPRESSION: Bilateral basilar infiltrates suggesting multifocal pneumonia. Electronically Signed   By: Lucienne Capers M.D.   On: 03/01/2020 05:02   DG CHEST PORT 1 VIEW  Result Date: 02/24/2020 CLINICAL DATA:  COVID-19 positive 02/20/2020, respiratory failure, intubated EXAM: PORTABLE CHEST 1 VIEW COMPARISON:  02/23/2020 FINDINGS: Single frontal view of the chest demonstrates endotracheal tube well above carina. Enteric catheter passes below diaphragm tip excluded by collimation. Bilateral internal jugular catheters are unchanged. Cardiac silhouette is stable. Persistent patchy consolidation at the lung bases, unchanged. No effusion or pneumothorax. No acute bony abnormalities. IMPRESSION: 1. Stable support devices. 2. Persistent bibasilar patchy consolidation which could reflect atelectasis or pneumonia. Electronically Signed   By: Randa Ngo M.D.   On: 02/24/2020 18:07   DG CHEST PORT 1 VIEW  Result Date: 02/23/2020 CLINICAL DATA:  Encounter for central line placement EXAM: PORTABLE CHEST 1 VIEW COMPARISON:  02/21/2020 FINDINGS: Endotracheal tube approximately 2 cm above the carina. NG tube enters the stomach with the tip not  visualized. Right jugular central venous catheter tip in the right atrium unchanged. New left jugular catheter with the tip in the proximal SVC. No pneumothorax. Mild right lower lobe atelectasis. Negative for edema or effusion IMPRESSION: Left jugular central venous catheter tip in the SVC. No pneumothorax. Mild right lower lobe atelectasis. Endotracheal tube 2 cm above the carina. Electronically Signed   By: Franchot Gallo M.D.   On: 02/23/2020 15:14   DG Chest Port 1 View  Result Date: 02/20/2020 CLINICAL DATA:  COVID EXAM: PORTABLE CHEST 1 VIEW COMPARISON:  01/12/2020 FINDINGS: Interval extubation. Interval placement of large-bore right neck vascular catheter, tip  position near the superior cavoatrial junction. The heart size and mediastinal contours are within normal limits. Both lungs are clear. The visualized skeletal structures are unremarkable. IMPRESSION: 1. Interval placement of large-bore right neck vascular catheter, tip position near the superior cavoatrial junction. 2. No acute abnormality of the lungs in AP portable projection. Electronically Signed   By: Eddie Candle M.D.   On: 02/20/2020 13:43   DG Chest Port 1V same Day  Result Date: 03/02/2020 CLINICAL DATA:  Shortness of breath.  Altered mental status. EXAM: PORTABLE CHEST 1 VIEW COMPARISON:  Radiograph yesterday. FINDINGS: Enteric tube remains in place with tip below the diaphragm. Right internal jugular dialysis catheter unchanged. Improving cardiomegaly with stable mediastinal contours. Persistent but improving patchy bibasilar opacities. No pneumothorax. No large pleural effusion. Surgical hardware in the lower cervical spine is partially included. IMPRESSION: 1. Persistent but improving patchy bibasilar opacities, atelectasis versus pneumonia. 2. Improving cardiomegaly. Electronically Signed   By: Keith Rake M.D.   On: 03/02/2020 15:17   DG Chest Port 1V same Day  Result Date: 02/21/2020 CLINICAL DATA:  Shortness of  breath, COVID EXAM: PORTABLE CHEST 1 VIEW COMPARISON:  02/20/2020 FINDINGS: Right dialysis catheter remains in place, unchanged. Heart is normal size. No confluent airspace opacities or effusions. No acute bony abnormality. IMPRESSION: No acute cardiopulmonary disease. Electronically Signed   By: Rolm Baptise M.D.   On: 02/21/2020 13:03   DG Swallowing Func-Speech Pathology  Result Date: 03/03/2020 Objective Swallowing Evaluation: Type of Study: MBS-Modified Barium Swallow Study  Patient Details Name: Han Vejar. MRN: [id number] Date of Birth: [birth year] Today's Date: 03/03/2020 Time: SLP Start Time (ACUTE ONLY): 9476 -SLP Stop Time (ACUTE ONLY): 1418 SLP Time Calculation (min) (ACUTE ONLY): 24 min Past Medical History: Past Medical History: Diagnosis Date . Anemia  . Arthritis   left knee . Chronic kidney disease   acute renal failure/injury requiring short-term HD 2013 . Colon cancer ([place])  . Colon polyp   11/2011 - Polyps identified, biopsy - invasive adenocarinoma . DM II (diabetes mellitus, type II), controlled ([place])   type 2 IDDM x 15 years. A1C 1/09 13.7.  Marland Kitchen 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 12/25/2011 . GERD (gastroesophageal reflux disease)  . Heart murmur  . Hx of ileostomy 10/13/2012 . Hyperlipidemia  . Hypertension   for a few years and resolved in 2013/2014. . Sleep apnea   does not wear CPAP now . Wears glasses  Past Surgical History: Past Surgical History: Procedure Laterality Date . APPLICATION OF WOUND VAC  5/46/5035  Procedure: APPLICATION OF WOUND VAC;  Surgeon: Zenovia Jarred, MD;  Location: [place];  Service: General;  Laterality: N/A; . APPLICATION OF WOUND VAC  4/65/6812  Procedure: APPLICATION OF WOUND VAC;  Surgeon: Imogene Burn. [name], MD;  Location: [place];  Service: General;  Laterality: N/A; . AV FISTULA PLACEMENT Left 07/29/2019  Procedure: ARTERIOVENOUS (AV) FISTULA CREATION;  Surgeon: Marty Heck, MD;  Location: [place];  Service: Vascular;  Laterality:  Left; . COLONOSCOPY   . COLONOSCOPY  02/06/2012  Procedure: COLONOSCOPY;  Surgeon: Milus Banister, MD;  Location: [place];  Service: Endoscopy;; . COLONOSCOPY WITH PROPOFOL N/A 06/10/2019  Procedure: COLONOSCOPY WITH PROPOFOL;  Surgeon: Milus Banister, MD;  Location: [facility];  Service: Endoscopy;  Laterality: N/A; . COLOSTOMY REVERSAL   . DRESSING CHANGE UNDER ANESTHESIA  02/18/2012  Procedure: DRESSING CHANGE UNDER ANESTHESIA;  Surgeon: Odis Hollingshead, MD;  Location: [place];  Service: General;  Laterality: N/A; . ESOPHAGOGASTRODUODENOSCOPY (EGD) WITH PROPOFOL N/A 06/10/2019  Procedure: ESOPHAGOGASTRODUODENOSCOPY (EGD) WITH PROPOFOL;  Surgeon: Milus Banister, MD;  Location: [facility];  Service: Endoscopy;  Laterality: N/A; . GANGLION CYST EXCISION  20 YRS AGO  RT ARM . ILEOSTOMY  02/16/2012  Procedure: ILEOSTOMY;  Surgeon: Zenovia Jarred, MD;  Location: [place];  Service: General;  Laterality: N/A; . ILEOSTOMY CLOSURE N/A 09/02/2012  Procedure: ILEOSTOMY REVERSAL;  Surgeon: Imogene Burn. [name], MD;  Location: [place];  Service: General;  Laterality: N/A; . ILIOSTOMY   . INCISION AND DRAINAGE OF WOUND  02/23/2012  Procedure: IRRIGATION AND DEBRIDEMENT WOUND;  Surgeon: Joyice Faster. Cornett, MD;  Location: [place];  Service: General;  Laterality: N/A; . IR DIALY SHUNT INTRO [place] W/IMG LEFT Left 01/21/2020 . IR FLUORO GUIDE CV LINE RIGHT  01/21/2020 . IR REMOVAL TUN CV CATH W/O FL  01/07/2020 . IR US GUIDE VASC ACCESS LEFT  01/21/2020 . IR US GUIDE VASC ACCESS RIGHT  01/21/2020 . LAPAROTOMY  02/16/2012  Procedure: EXPLORATORY LAPAROTOMY;  Surgeon: Zenovia Jarred, MD;  Location: [place];  Service: General;  Laterality: N/A;  Exploratory Laparotomy with resection of anastomosis . LAPAROTOMY  02/18/2012  Procedure: EXPLORATORY LAPAROTOMY;  Surgeon: Odis Hollingshead, MD;  Location: [place];  Service: General;  Laterality: N/A;  exploratory laparotomy  change of abdominal vac dressing . LAPAROTOMY  02/20/2012  Procedure:  EXPLORATORY LAPAROTOMY;  Surgeon: Odis Hollingshead, MD;  Location: [place];  Service: General;  Laterality: N/A; . LAPAROTOMY  02/23/2012  Procedure: EXPLORATORY LAPAROTOMY;  Surgeon: Joyice Faster. Cornett, MD;  Location: [place];  Service: General;  Laterality: N/A;  Irrigation and Debridement of abdominal wound with wound vac change with partial closure . LAPAROTOMY  02/26/2012  Procedure: EXPLORATORY LAPAROTOMY;  Surgeon: Imogene Burn. [name], MD;  Location: [place];  Service: General;  Laterality: N/A;   . PARTIAL COLECTOMY  02/06/2012 . PARTIAL COLECTOMY  02/06/2012  Procedure: PARTIAL COLECTOMY;  Surgeon: Imogene Burn. [name], MD;  Location: [place];  Service: General;  Laterality: N/A;  right partial colectomy . POSTERIOR CERVICAL FUSION/FORAMINOTOMY N/A 01/06/2020  Procedure: CERVICAL FOUR- THORACIC TWO LAMINECTOMY AND FUSION;  Surgeon: Consuella Lose, MD;  Location: [place];  Service: Neurosurgery;  Laterality: N/A;  CERVICAL FOUR- THORACIC TWO LAMINECTOMY AND FUSION . RESECTION SMALL BOWEL / CLOSURE ILEOSTOMY  402/2014  Dr [name] . VACUUM ASSISTED CLOSURE CHANGE  02/20/2012  Procedure: ABDOMINAL VACUUM ASSISTED CLOSURE CHANGE;  Surgeon: Odis Hollingshead, MD;  Location: [place];  Service: General;; . VACUUM ASSISTED CLOSURE CHANGE  02/23/2012  Procedure: ABDOMINAL VACUUM ASSISTED CLOSURE CHANGE;  Surgeon: Joyice Faster. Cornett, MD;  Location: [place];  Service: General;  Laterality: N/A; HPI: Stanislaus Apelles Lennis Korb. is a 69 y.o. male with medical history significant of GERD, hypertension, hyperlipidemia ESRD on hemodialysis, type 2 diabetes mellitus, anemia of chronic disease, colon cancer status post resection, epidural abscess status post laminectomy, posterior cervical fusion on 01/06/2020 and MRSA bacteremia and presented emergently for evaluation of confusion and COVID-19 positive. Initial BSE 02/21/20 was limited- needed enouragement, audible swallow, eructation, poor dentition and reduced endurance, Work of breathing was stable. Dys  2/thin recommended. On 9/22 became unresponsive after HD, transferred to ICU and intubated then extubated 9/25. Currently has NGT, encephalopathy improving per MD note and speech to re-eval. CXR Bilateral basilar infiltrates suggesting multifocal pneumonia.  No data recorded Assessment / Plan / Recommendation CHL IP CLINICAL IMPRESSIONS 03/03/2020 Clinical Impression Pt demonstrates mild orpharyngeal  dysphagia mostly related to anatomical differences. 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 In addition, initiation of swallow is intermittently late with boluses reaching the pyriform sinuses for up to 5 seconds prior to swallow. His epiglottis at times does not fully invert adding to inadequate protection. Thin barium was penetrated before and during the swallow remaining superior to the vocal cords. His reflexive throat clear, present most of the time did not clear vestibule and a chin tuck was not effective. Penetration was present once with nectar at end of study. Nectar thick recommended for pt to initiate diet and upgrade to thin from clinical observation with SLP. Dys 2 texture recommended, crush pills, small sips/bites, intermittent throat clear and avoid straws.    SLP Visit Diagnosis Dysphagia, pharyngeal phase (R13.13) Attention and concentration deficit following -- Frontal lobe and executive function deficit following -- Impact on safety and function Moderate aspiration risk   CHL IP TREATMENT RECOMMENDATION 03/03/2020 Treatment Recommendations Therapy as outlined in treatment plan below   Prognosis 03/03/2020 Prognosis for Safe Diet Advancement Good Barriers to Reach Goals -- Barriers/Prognosis Comment -- CHL IP DIET RECOMMENDATION 03/03/2020 SLP Diet Recommendations Dysphagia 2 (Fine chop) solids;Nectar thick liquid Liquid Administration via Cup Medication Administration Crushed with puree  Compensations Slow rate;Small sips/bites;Clear throat intermittently Postural Changes Seated upright at 90 degrees   CHL IP OTHER RECOMMENDATIONS 03/03/2020 Recommended Consults -- Oral Care Recommendations Oral care BID Other Recommendations Order thickener from pharmacy   CHL IP FOLLOW UP RECOMMENDATIONS 03/03/2020 Follow up Recommendations (No Data)   CHL IP FREQUENCY AND DURATION 03/03/2020 Speech Therapy Frequency (ACUTE ONLY) min 2x/week Treatment Duration 2 weeks      CHL IP ORAL PHASE 03/03/2020 Oral Phase Impaired Oral - Pudding Teaspoon -- Oral - Pudding Cup -- Oral - Honey Teaspoon -- Oral - Honey Cup -- Oral - Nectar Teaspoon -- Oral - Nectar Cup WFL Oral - Nectar Straw -- Oral - Thin Teaspoon -- Oral - Thin Cup Decreased bolus cohesion Oral - Thin Straw -- Oral - Puree -- Oral - Mech Soft -- Oral - Regular Delayed oral transit Oral - Multi-Consistency -- Oral - Pill -- Oral Phase - Comment --  CHL IP PHARYNGEAL PHASE 03/03/2020 Pharyngeal Phase Impaired Pharyngeal- Pudding Teaspoon -- Pharyngeal -- Pharyngeal- Pudding Cup -- Pharyngeal -- Pharyngeal- Honey Teaspoon -- Pharyngeal -- Pharyngeal- Honey Cup -- Pharyngeal -- Pharyngeal- Nectar Teaspoon -- Pharyngeal -- Pharyngeal- Nectar Cup Delayed swallow initiation-pyriform sinuses;Reduced epiglottic inversion;Reduced airway/laryngeal closure;Penetration/Aspiration during swallow;Pharyngeal residue - valleculae Pharyngeal Material enters airway, remains ABOVE vocal cords and not ejected out Pharyngeal- Nectar Straw -- Pharyngeal -- Pharyngeal- Thin Teaspoon -- Pharyngeal -- Pharyngeal- Thin Cup Delayed swallow initiation-pyriform sinuses;Pharyngeal residue - valleculae;Pharyngeal residue - pyriform;Penetration/Aspiration before swallow;Reduced airway/laryngeal closure Pharyngeal Material enters airway, remains ABOVE vocal cords and not ejected out Pharyngeal- Thin Straw -- Pharyngeal -- Pharyngeal- Puree -- Pharyngeal -- Pharyngeal- Mechanical Soft --  Pharyngeal -- Pharyngeal- Regular WFL Pharyngeal -- Pharyngeal- Multi-consistency -- Pharyngeal -- Pharyngeal- Pill -- Pharyngeal -- Pharyngeal Comment --  CHL IP CERVICAL ESOPHAGEAL PHASE 03/03/2020 Cervical Esophageal Phase WFL Pudding Teaspoon -- Pudding Cup -- Honey Teaspoon -- Honey Cup -- Nectar Teaspoon -- Nectar Cup -- Nectar Straw -- Thin Teaspoon -- Thin Cup -- Thin Straw -- Puree -- Mechanical Soft -- Regular -- Multi-consistency -- Pill -- Cervical Esophageal Comment -- [place] Siren 03/03/2020, 4:35 PM Orbie Pyo Litaker M.Ed [employer]  Pathologist Pager [phone] Office [phone]              VAS [place] LOWER EXTREMITY VENOUS (DVT)  Result Date: 02/23/2020  Lower Venous DVTStudy Indications: Elevated D dimer. Other Indications: + Covid. Risk Factors: None identified. Performing Technologist: Griffin Basil RCT RDMS  Examination Guidelines: A complete evaluation includes B-mode imaging, spectral Doppler, color Doppler, and power Doppler as needed of all accessible portions of each vessel. Bilateral testing is considered an integral part of a complete examination. Limited examinations for reoccurring indications may be performed as noted. The reflux portion of the exam is performed with the patient in reverse Trendelenburg.  +---------+---------------+---------+-----------+----------+--------------+ RIGHT    CompressibilityPhasicitySpontaneityPropertiesThrombus Aging +---------+---------------+---------+-----------+----------+--------------+ CFV      Full           Yes      Yes                                 +---------+---------------+---------+-----------+----------+--------------+ SFJ      Full                                                        +---------+---------------+---------+-----------+----------+--------------+ FV Prox  Full                                                         +---------+---------------+---------+-----------+----------+--------------+ FV Mid   Full                                                        +---------+---------------+---------+-----------+----------+--------------+ FV DistalFull                                                        +---------+---------------+---------+-----------+----------+--------------+ PFV      Full                                                        +---------+---------------+---------+-----------+----------+--------------+ POP      Full           Yes      Yes                                 +---------+---------------+---------+-----------+----------+--------------+ PTV      Full                                                        +---------+---------------+---------+-----------+----------+--------------+  PERO     Full                                                        +---------+---------------+---------+-----------+----------+--------------+   +---------+---------------+---------+-----------+----------+--------------+ LEFT     CompressibilityPhasicitySpontaneityPropertiesThrombus Aging +---------+---------------+---------+-----------+----------+--------------+ CFV      Full           Yes      Yes                                 +---------+---------------+---------+-----------+----------+--------------+ SFJ      Full                                                        +---------+---------------+---------+-----------+----------+--------------+ FV Prox  Full                                                        +---------+---------------+---------+-----------+----------+--------------+ FV Mid   Full                                                        +---------+---------------+---------+-----------+----------+--------------+ FV DistalFull                                                         +---------+---------------+---------+-----------+----------+--------------+ PFV      Full                                                        +---------+---------------+---------+-----------+----------+--------------+ POP      Full           Yes      Yes                                 +---------+---------------+---------+-----------+----------+--------------+ PTV      Full                                                        +---------+---------------+---------+-----------+----------+--------------+ PERO     Full                                                        +---------+---------------+---------+-----------+----------+--------------+  Summary: RIGHT: - There is no evidence of deep vein thrombosis in the lower extremity.  - No cystic structure found in the popliteal fossa.  LEFT: - There is no evidence of deep vein thrombosis in the lower extremity.  - No cystic structure found in the popliteal fossa.  *See table(s) above for measurements and observations. Electronically signed by Harold Barban MD on 02/23/2020 at 9:31:43 PM.    Final    US Abdomen Limited RUQ  Result Date: 02/23/2020 CLINICAL DATA:  Transaminitis EXAM: ULTRASOUND ABDOMEN LIMITED RIGHT UPPER QUADRANT COMPARISON:  None. FINDINGS: Gallbladder: The gallbladder was not visualized and may be surgically absent Common bile duct: Diameter: 3 mm Liver: The liver parenchyma is coarsened and heterogeneous. The liver surface appears somewhat nodular there is no discrete hepatic mass. There is suggestion of bidirectional flow within the portal vein Other: The right kidney appears atrophic and echogenic. There are multiple right-sided renal cysts measuring up to approximately 3.6 cm. IMPRESSION: 1. Gallbladder not visualized. 2. Coarsened and heterogeneous appearance of the liver parenchyma with findings suspicious for underlying cirrhosis. 3. Borderline bidirectional flow within the main portal vein is suggestive of  portal hypertension. 4. Echogenic and atrophic appearing right kidney consistent with the patient's history of renal failure. Electronically Signed   By: Constance Holster M.D.   On: 02/23/2020 19:21

## 2020-03-11 DIAGNOSIS — G9341 Metabolic encephalopathy: Secondary | ICD-10-CM | POA: Diagnosis not present

## 2020-03-11 LAB — CBC
HCT: 24 % — ABNORMAL LOW (ref 39.0–52.0)
Hemoglobin: 7.8 g/dL — ABNORMAL LOW (ref 13.0–17.0)
MCH: 27.5 pg (ref 26.0–34.0)
MCHC: 32.5 g/dL (ref 30.0–36.0)
MCV: 84.5 fL (ref 80.0–100.0)
Platelets: 130 10*3/uL — ABNORMAL LOW (ref 150–400)
RBC: 2.84 MIL/uL — ABNORMAL LOW (ref 4.22–5.81)
RDW: 19.2 % — ABNORMAL HIGH (ref 11.5–15.5)
WBC: 6.7 10*3/uL (ref 4.0–10.5)
nRBC: 0 % (ref 0.0–0.2)

## 2020-03-11 LAB — GLUCOSE, CAPILLARY
Glucose-Capillary: 159 mg/dL — ABNORMAL HIGH (ref 70–99)
Glucose-Capillary: 125 mg/dL — ABNORMAL HIGH (ref 70–99)

## 2020-03-11 MED ORDER — HEPARIN SODIUM (PORCINE) 1000 UNIT/ML IJ SOLN
INTRAMUSCULAR | Status: AC
  Filled 2020-03-11: qty 2

## 2020-03-11 NOTE — Progress Notes (Signed)
[facility] Progress Note  Subjective:   Pt seen in room, no c/o, watching TV.    Vitals:   03/11/20 1356 03/11/20 1401 03/11/20 1430 03/11/20 1500  BP: (!) 193/90 (!) 179/90 (!) 197/88 (!) 198/85  Pulse: 81 80 78 77  Resp: 18 18 18 18   Temp: 98.2 F (36.8 C)     TempSrc: Oral     SpO2: 99%     Weight: 85.3 kg     Height:        Exam:  alert, nad   no jvd  Chest cta bilat  Cor reg no RG  Abd soft ntnd no ascites   Ext no LE edema   Alert, NF, ox3  +TDC/ L AVF+bruit     CXR 9/23 - patchy perihilar/ basilar dz, no edema      OP HD: [facility] MWF   4h 27min  87kg  2/2 bath  TDC / L AVF  Hep none  -Mircera 150 mcg IV q 2 weeks (last dose 02/09/2020) -Venofer 100 mg IV X 8 doses (not started yet) -Calcitriol 1.5 mcg PO TIW  Assessment/ Plan: 1. COVID PNA/ HCAP (EColi/ MSSA resp culture): Tested positive at [facility] 09/18 and again here. Got IV cefazolin thru 10/1. Remdesivir stopped 9/22 d/t ^LFT's.  COVID isolation ends on 03/12/20, which is tomorrow.  2. Acute hypoxic resp failure.  At one point required mechanical ventilation. Resolved.  3. AMS - better now, responsive and interacting, but continues to refuse some medical Rx's intermittently including dialysis. Signed off HD wed after 2hrs. Trying to sign off early again today after 1 hr on the machine.  4. Recent epidural abscess/MRSA cervical discitis-  S/p decompression surgery C4-T2 on 01/06/20. 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  NSurg recommended no further surgery. Plans w/ ID were for Vancomycin and Rifampin here through 10/01 but rifampin dc'd early due to ^LFT"s. PO doxy started on 10/01 and pt is now off IV vanc. Will f/u w/ ID clinic in 3-4 wks, they will guide on the stop date. 5.  [facility] - MWF HD. HIT testing was negative. OK to use heparin to block catheter now. HD today off schedule, then HD Monday.  6.  HD access - L BC AVF placed Feb 2021. Was used starting in May and had  infiltration in Aug prompting [facility] placement to rest the AVF. Have used AVF w/o issues on last 3 HD sessions. We were considering TDC removal, but due to lack of good IV access have kept in for now.  7.  Hypertension/volume - Previously on high dose midodrine here and this is now off.  Keep even on HD (previously on anti-hypertensives at home), has not tolerated UF here lately.  8. Anemia  - aranesp increased to 150 mcg for 9/29 onward for now. Hold OP Fe load.  s/p PRBC's 9/29 and may need again soon  9.  Metabolic bone disease - off renvela due to hypophosphatemia and improved last check.  Anticipate need to resume at a lower dose per trends. on calcitriol - corrected Ca ok 10.  DM-per primary 11.  Low plts - 2nd level testing for HIT was negative.  43   H/o HCV/ liver cirrhosis - hep C testing here was negative. Per pmd will need GI f/u in OP setting.  13.   Loose stools/ sacral wound - flexiseal in place now 14.  Dispo - awaiting SNF placement  Kelly Splinter, MD  03/11/2020, 3:34 PM       Recent Labs  Lab 03/08/20 0808 03/09/20 1031 03/10/20 1109 03/11/20 0233  K 3.8  --  4.2  --   BUN 40*  --  35*  --   CREATININE 8.04*  --  8.71*  --   CALCIUM 7.7*  --  7.5*  --   PHOS 5.1*  --  4.9*  --   HGB 5.9*   < > 7.9* 7.8*   < > = values in this interval not displayed.   Inpatient medications: . calcitRIOL  1.5 mcg Oral Q M,W,F-HD  . chlorhexidine  15 mL Mouth Rinse BID  . Chlorhexidine Gluconate Cloth  6 each Topical Q0600  . collagenase   Topical Daily  . darbepoetin (ARANESP) injection - DIALYSIS  150 mcg Intravenous Q Wed-HD  . doxycycline  100 mg Oral Q12H  . feeding supplement (NEPRO CARB STEADY)  237 mL Oral BID BM  . heparin sodium (porcine)      . insulin aspart  0-5 Units Subcutaneous QHS  . insulin aspart  0-9 Units Subcutaneous TID WC  . mouth rinse  15 mL Mouth Rinse q12n4p  . multivitamin  1 tablet Oral QHS  . pantoprazole  40 mg Oral Daily  . QUEtiapine  25 mg  Oral BID  . thiamine  100 mg Oral Daily  . zinc sulfate  220 mg Oral Daily    haloperidol lactate, lip balm, loperamide, metoprolol tartrate, ondansetron **OR** ondansetron (ZOFRAN) IV, Resource ThickenUp Clear   Sol Blazing, MD 03/11/2020  3:34 PM

## 2020-03-11 NOTE — Progress Notes (Addendum)
Pt's SpO2 probe replaced several times this shift, pt unwilling to keep probe on for more than a few minutes. Pt also unable to keep condom catheter on or his cardiac monitor leads. Pt did allow staff to get his AM VS but refused to allow staff to bathe him, obtain CBG, reapply sacral dressing that he pulled off & threw in the floor, or attach any of the monitor leads on him, becoming belligerent at this time.

## 2020-03-11 NOTE — Progress Notes (Signed)
PROGRESS NOTE                                                                                                                                                                                                             Patient Demographics:    [name], is a 69 y.o. male, DOB - April 22, 1951, UEA:[id number]  Outpatient Primary MD for the patient is [facility]   Admit date - 02/20/2020   LOS - 27  Chief Complaint  Patient presents with   Covid Positive   AMS       Brief Narrative: Patient is a 69 y.o. male with PMHx of ESRD on HD MWF, DM-2, MRSA bacteremia with epidural abscess s/p C4-T2 laminectomy/fusion on 01/06/2020-on IV vancomycin/rifampin through 10/1-resident of SNF-apparently tested positive for COVID-19 on 9/18-subsequently brought to the ED on 9/19 for worsening confusion (more than baseline)-fever-thought to have acute metabolic encephalopathy from COVID-19 infection and admitted to the hospitalist service.  Further hospital course complicated by development of hypotension/severe encephalopathy requiring intubation and ICU transfer.  See below for further details.  COVID-19 vaccinated status: Unvaccinated  Significant Events: 8/3-8/23>> hospitalized for epidural abscess/MRSA bacteremia-underwent laminectomy-briefly intubated due to altered mental status.  Discharged to SNF 9/18>> COVID-19 positive at SNF 9/19>> Admit to [facility] for fever/confusion 9/22>> obtunded/hypotensive after coming back from hemodialysis-emergently intubated/transferred to ICU 9/30>> transferred back to [facility] 9/30>> SQ heparin stopped-pharmacy consulted for HIT protocol.  Significant studies: 8/6>> Echo: EF> 75% 9/19>>Chest x-ray: No acute abnormality in the lung. 9/19>> CT head: No acute intracranial abnormality 9/19>> CT C-spine: Postoperative findings of posterior laminectomy/fusion C4-T1, prevertebral soft tissue edema  appears reduced, postoperative fluid collection overlies laminectomy site does not appear significantly changed compared to prior examination. 9/20>> MRI C-spine: Attempted but due to AMS-not completed 9/21>> MRI C Spine: Osteomyelitis of skull base, anterior C1 and odontoid, C2-C3 facet joint-associated erosion of bone since 8/27-resolved epidural abscess 9/22>> coarsened/heterogeneous appearance of the liver parenchyma-suspicious for cirrhosis, gallbladder not visualized. 9/22>> bilateral lower extremity Doppler: No DVT 9/29>> chest x-ray: Bibasilar infiltrates suggesting multifocal pneumonia  COVID-19 medications: Remdesivir: 9/19>> 9/21  Antibiotics: IV vancomycin plan to continue through October 1 (rifampin stopped 9/21 due to elevated LFTs) Ancef: 9/26>> Zosyn: 9/24>> 9/26  Microbiology data: 9/20 >>blood culture: No growth so far 9/21>> blood culture: No growth so far 9/24>> sputum  culture: E. coli/MSSA  Procedures: ET tube 9/22 > 9/25  Consults: Nephrology PCCM GI  DVT prophylaxis: heparin injection 5,000 Units Start: 02/20/20>> stopped on 9/30 due to worsening thrombocytopenia. SCDs Start: 02/20/20 1628   Subjective:   Patient in bed, appears comfortable, denies any headache, no fever, no chest pain or pressure, no shortness of breath , no abdominal pain. No focal weakness. Continues to refuse medications and lab draws as he pleases despite counseling by multiple physicians including me.   Assessment  & Plan :   Acute hypoxic respiratory failure secondary to HCAP (E. coli/MSSA in sputum culture) and COVID-19 pneumonia Initially admitted and thought to have mild disease. Started on steroid/Remdesivir. But decompensated on 9/22 requiring intubation and ICU transfer.  Extubated 9/26.  Found to have MSSA in the sputum culture.  Given intravenous cefazolin till 10/1. Remdesivir stopped on 9/22 due to significantly elevated LFTs.    Respiratory status remained stable.  He  is saturating normal on room air. Tested positive on 9/19.  He can end COVID-19 isolation on 10/10.  Fall Patient sustained a fall few days ago .  No injuries were noted.  CT scan of the head and neck unremarkable for any acute process.  He does have excoriation in the posterior right scalp.  No active bleeding noted.  Acute metabolic encephalopathy Multifactorial etiology-on initial presentation to the hospital was felt to be due to uremia (due to under dialysis, patient got short outpatient HD numerous times), sepsis physiology from COVID-19. However his mental status markedly deteriorated on 9/22 likely due to worsening sepsis physiology from HCAP.  Post extubation has had issues with delirium/likely related to intubation/ICU stay.  Required Precedex while in the ICU.  Patient has no longer encephalopathic but has been refusing treatments and medications and blood draws.  He was started on Seroquel.  Haldol as needed.  Psychiatry was consulted however they have been trying only televisits due to his COVID-19 status.  He has not been fully cooperative with them.  Psychiatry did recommend increasing dose of Seroquel.  Patient will come off isolation on 10/10 and maybe psychiatry can do face to face visit at that time.  Patient refuses to accept treatment plan and medications.  He does not understand the nature of his disease process and the need for hemodialysis.  He lacks an appreciation of the nature of his underlying medical condition.  He is unable to rationally manipulate information to make an informed decision.  I believe that the patient does NOT have the capacity to make decisions for himself at this time. Formal psych consult face-to-face will be requested on 03/12/2020 after he has finished his COVID-19 isolation.  He apparently has short-term memory issues. There was some thought as to whether patient has underlying dementia. Has a history of alcohol/cocaine use in the past.  Continue  thiamine.   Dysphagia He was getting tube feedings however the tube came out on 10/3.  He was started on a dysphagia diet.  Seems to have improved.  Tolerating diet better than before. Patient therapy following.  Elevated D-dimer Secondary to COVID-19.  He did not have any significant hypoxia.  Lower extremity Dopplers were negative.  Since he has clinically improved will not pursue any further work-up.  D-dimer was 6.16 on October 1.  Clinically has improved.    History of MRSA bacteremia with epidural abscess requiring C4-T2 laminectomy with residual osteomyelitis of the skull base :  MRI C-spine completed on 9/21. Appears to have residual  osteomyelitis of the skull base area. Was discussed with neurosurgeon Dr. Kathyrn Sheriff on 9/21. No further surgical intervention required.  Plans were to continue vancomycin/rifampin through October 1-however rifampin discontinued due to significantly elevated LFTs.  Patient now off of vancomycin.  Started on doxycycline from 10/1, plan per primary critical care and ID pharmacist is to follow with ID clinic in 3 to 4 weeks and then they will guide on the stop date.  Patient will need follow-up with infectious disease clinic post discharge.  Will also need follow-up with neurosurgery.  Continues to have staples in his neck area.  Septic shock, resolved Required pressors while in the ICU. Thought to be most likely secondary to HCAP although patient has a history of recent MRSA bacteremia/cervical spine osteomyelitis/abscess.   Thrombocytopenia/HIT has been ruled out Suspect this is multifactorial in etiology from acute illness/sepsis/thrombocytopenia-potentially some contribution from underlying liver cirrhosis-HIT ruled out and platelet counts have stabilized.  Normocytic Anemia Secondary to ESRD. Worsened by acute illness-s/p 1 unit of PRBC on 9/29.   Hemoglobin noted to be 5.9 on 10/6 2 units of PRBC ordered however patient cut short his dialysis treatment.   He was refusing another IV access placement.  Subsequently the dialysis catheter was accessed and blood was transfused through it. Hemoglobin improved to 8.8 as of yesterday. No overt bleeding noted.  Shock liver LFTs have normalized.    History of HCV/liver cirrhosis HCV antibody positive in the past.  Viral load was done and no hepatitis C quantitative was detected.   Liver ultrasound with suggestion of cirrhotic appearance.  Will need outpatient GI follow-up.  ESRD on HD MWF Nephrology following.  Patient being dialyzed as per his usual schedule.  Patient has been curtailing his dialysis treatment.  Has not been cooperative.  Sacral wound/loose stools Wound care is following for sacral wounds.  Currently has a Flexi-Seal.  Mainly present to protect the wounds that he has in the lower back area.   Imodium as needed.  Hopefully we can discontinue the Flexi-Seal if his stool output decreases.    Metabolic acidosis Likely secondary to incomplete HD treatments. Resolved with regular HD sessions.  Hyperkalemia Likely secondary to incomplete dialysis treatments, resolved with hemodialysis  Essential hypertension  Tolerate higher blood pressure in this patient who undergoes dialysis.  Continue to monitor.    Hyperlipidemia Statin on hold-should be able to resume when closer to discharge.  Questionable dementia Discussed with patient's daughter.  She is not certain if patient truly has cognitive impairment or dementia.  Psychiatry consulted.  Deconditioning/debility: Secondary to acute illness on top of chronic debility.  PT eval completed-SNF on discharge.  DM-2 (A1c 6.5 on 8/11):  Appears to have brittle diabetes resulting in hypoglycemic episodes while in the ICU.  CBGs noted to be borderline low.  Discontinued Levemir to avoid hypoglycemia.    CBG (last 3)  Recent Labs    03/10/20 1125 03/10/20 1557 03/10/20 2033  GLUCAP 105* 103* 86     GI prophylaxis: PPI  Condition -  Stable  Family Communication  :  Daughter Vartan Kerins [phone]) previous MD on 03/10/2020.  Code Status :  Full Code  Diet :  Diet Order            DIET DYS 3 Room service appropriate? No; Fluid consistency: Thin  Diet effective now                  Disposition Plan  : Plan is for  the patient to go to skilled nursing facility when medically improved.  His refusal of medical treatment has been a major challenge.  Status is: Inpatient  Remains inpatient appropriate because:Inpatient level of care appropriate due to severity of illness  Dispo: The patient is from: SNF              Anticipated d/c is to: SNF              Anticipated d/c date is: Hopefully by the end of this week              Patient currently is not medically stable to d/c.    Antimicorbials  :    Anti-infectives (From admission, onward)   Start     Dose/Rate Route Frequency Ordered Stop   03/06/20 2000  doxycycline (VIBRA-TABS) tablet 100 mg        100 mg Oral Every 12 hours 03/06/20 1632     03/04/20 1000  doxycycline (VIBRA-TABS) tablet 100 mg  Status:  Discontinued        100 mg Oral Every 12 hours 03/01/20 0944 03/04/20 0741   03/04/20 1000  doxycycline (VIBRA-TABS) tablet 100 mg  Status:  Discontinued        100 mg Per Tube Every 12 hours 03/04/20 0741 03/06/20 1632   02/27/20 1500  ceFAZolin (ANCEF) IVPB 1 g/50 mL premix        1 g 100 mL/hr over 30 Minutes Intravenous Every 24 hours 02/27/20 1433 03/02/20 1445   02/25/20 1000  piperacillin-tazobactam (ZOSYN) IVPB 2.25 g  Status:  Discontinued        2.25 g 100 mL/hr over 30 Minutes Intravenous Every 8 hours 02/25/20 0905 02/27/20 1433   02/25/20 0830  piperacillin-tazo (ZOSYN) NICU IV syringe 225 mg/mL  Status:  Discontinued        100 mg/kg of piperacillin  84.3 kg 84.4 mL/hr over 30 Minutes Intravenous Every 8 hours 02/25/20 0825 02/25/20 0905   02/25/20 0814  vancomycin (VANCOCIN) 1-5 GM/200ML-% IVPB       Note to Pharmacy: Cherylann Banas    : cabinet override      02/25/20 0814 02/25/20 1158   02/23/20 0642  vancomycin (VANCOCIN) 1-5 GM/200ML-% IVPB       Note to Pharmacy: [place], [place] : cabinet override      02/23/20 0642 02/23/20 1859   02/21/20 1200  vancomycin (VANCOCIN) IVPB 1000 mg/200 mL premix        1,000 mg 200 mL/hr over 60 Minutes Intravenous Every M-W-F (Hemodialysis) 02/20/20 1959 03/03/20 1130   02/21/20 1000  remdesivir 100 mg in sodium chloride 0.9 % 100 mL IVPB  Status:  Discontinued       "Followed by" Linked Group Details   100 mg 200 mL/hr over 30 Minutes Intravenous Daily 02/20/20 1632 02/23/20 0633   02/20/20 2000  rifampin (RIFADIN) capsule 300 mg  Status:  Discontinued        300 mg Oral Every 12 hours 02/20/20 1907 02/23/20 0633   02/20/20 1730  remdesivir 200 mg in sodium chloride 0.9% 250 mL IVPB       "Followed by" Linked Group Details   200 mg 580 mL/hr over 30 Minutes Intravenous Once 02/20/20 1632 02/20/20 1913      Inpatient Medications  Scheduled Meds:  calcitRIOL  1.5 mcg Oral Q M,W,F-HD   chlorhexidine  15 mL Mouth Rinse BID   Chlorhexidine Gluconate Cloth  6 each Topical Q0600   collagenase  Topical Daily   darbepoetin (ARANESP) injection - DIALYSIS  150 mcg Intravenous Q Wed-HD   doxycycline  100 mg Oral Q12H   feeding supplement (NEPRO CARB STEADY)  237 mL Oral BID BM   insulin aspart  0-5 Units Subcutaneous QHS   insulin aspart  0-9 Units Subcutaneous TID WC   mouth rinse  15 mL Mouth Rinse q12n4p   multivitamin  1 tablet Oral QHS   pantoprazole  40 mg Oral Daily   QUEtiapine  25 mg Oral BID   thiamine  100 mg Oral Daily   zinc sulfate  220 mg Oral Daily   Continuous Infusions:  PRN Meds:.haloperidol lactate, lip balm, loperamide, metoprolol tartrate, ondansetron **OR** ondansetron (ZOFRAN) IV, Resource ThickenUp Clear    Lala Lund M.D on 03/11/2020 at 10:19 AM  To page go to [email or web address] - use universal password  [facility] -   Office  [phone]    Objective:   Vitals:   03/11/20 0400 03/11/20 0600 03/11/20 0612 03/11/20 0753  BP:  (!) 169/67  (!) 153/69  Pulse:  79 80 85  Resp: 19 (!) 24  18  Temp:  98.2 F (36.8 C)  98.1 F (36.7 C)  TempSrc:  Oral  Axillary  SpO2:  100% 99% 99%  Weight:      Height:        Wt Readings from Last 3 Encounters:  03/10/20 85.3 kg  01/28/20 86.2 kg  01/24/20 89.4 kg     Intake/Output Summary (Last 24 hours) at 03/11/2020 1019 Last data filed at 03/11/2020 0900 Gross per 24 hour  Intake 240 ml  Output --  Net 240 ml     Physical Exam  Awake Alert, No new F.N deficits,  [place].AT,PERRAL Supple Neck,No JVD, No cervical lymphadenopathy appriciated.  Symmetrical Chest wall movement, Good air movement bilaterally, CTAB RRR,No Gallops, Rubs or new Murmurs, No Parasternal Heave +ve B.Sounds, Abd Soft, No tenderness, No organomegaly appriciated, No rebound - guarding or rigidity. No Cyanosis, Clubbing or edema, No new Rash or bruise     Data Review:    CBC Recent Labs  Lab 03/05/20 1043 03/08/20 0808 03/09/20 1031 03/10/20 1109 03/11/20 0233  WBC 8.3 7.8 11.5* 7.1 6.7  HGB 8.1* 5.9* 8.8* 7.9* 7.8*  HCT 25.7* 18.4* 26.7* 24.9* 24.0*  PLT 94* 127* 150 139* 130*  MCV 85.1 84.0 84.8 85.6 84.5  MCH 26.8 26.9 27.9 27.1 27.5  MCHC 31.5 32.1 33.0 31.7 32.5  RDW 22.8* 22.4* 19.7* 19.6* 19.2*    Chemistries  Recent Labs  Lab 03/04/20 1555 03/05/20 1043 03/08/20 0808 03/10/20 1109  NA 134* 138 139 138  K 3.9 4.0 3.8 4.2  CL 99 101 103 103  CO2 25 23 23 25   GLUCOSE 166* 134* 83 121*  BUN 38* 49* 40* 35*  CREATININE 5.89* 6.92* 8.04* 8.71*  CALCIUM 7.9* 8.2* 7.7* 7.5*  AST 32 30  --   --   ALT 8 9  --   --   ALKPHOS 98 93  --   --   BILITOT 1.0 1.3*  --   --     Radiology Reports CT HEAD WO CONTRAST  Result Date: 03/09/2020 CLINICAL DATA:  Status post fall. EXAM: CT HEAD WITHOUT CONTRAST CT CERVICAL SPINE WITHOUT CONTRAST TECHNIQUE:  Multidetector CT imaging of the head and cervical spine was performed following the standard protocol without intravenous contrast. Multiplanar CT image reconstructions of the cervical spine were also generated. COMPARISON:  February 20, 2020. FINDINGS: CT HEAD FINDINGS Brain: Mild chronic ischemic white matter disease. No mass effect or midline shift is noted. Ventricular size is within normal limits. There is no evidence of mass lesion, hemorrhage or acute infarction. Vascular: No hyperdense vessel or unexpected calcification. Skull: Normal. Negative for fracture or focal lesion. Sinuses/Orbits: No acute finding. Other: None. CT CERVICAL SPINE FINDINGS Alignment: Normal. Skull base and vertebrae: Stable erosive changes are seen involving the dens and anterior arch of C1 most consistent with degenerative change. Status post laminectomy extending from C4 to T1. Status post surgical posterior fusion extending from C4 to T1 with bilateral intrapedicular screw placement at all levels except C7. Soft tissues and spinal canal: No prevertebral fluid or swelling. No visible canal hematoma. Disc levels: Severe degenerative disc disease is noted at C6-7 and C7-T1. Upper chest: Some degree of pleural effusion is seen involving the right lung apex. Other: None. IMPRESSION: 1. Mild chronic ischemic white matter disease. No acute intracranial abnormality seen. 2. Postsurgical and degenerative changes as described above. No acute abnormality seen in the cervical spine. Electronically Signed   By: Marijo Conception M.D.   On: 03/09/2020 13:14   CT Head Wo Contrast  Result Date: 02/20/2020 CLINICAL DATA:  Delirium.  COVID-19 positive.  Recent neck surgery. EXAM: CT HEAD WITHOUT CONTRAST TECHNIQUE: Contiguous axial images were obtained from the base of the skull through the vertex without intravenous contrast. COMPARISON:  January 28, 2020 FINDINGS: Brain: No subdural, epidural, or subarachnoid hemorrhage. A lacunar infarct in the  right thalamus is stable and nonacute. Moderate white matter changes again identified. No acute cortical ischemia or acute infarct noted. No mass effect or midline shift. Ventricles and sulci are unchanged. Cerebellum, brainstem, and basal cisterns are normal. Vascular: Calcified atherosclerosis in the intracranial carotids. Skull: Normal. Negative for fracture or focal lesion. Sinuses/Orbits: Mild scattered mucosal thickening in the ethmoid air cells and left maxillary sinus. No air-fluid levels. Mastoid air cells and middle ears are well aerated. Other: None. IMPRESSION: 1. No acute intracranial abnormalities. Chronic white matter changes. Chronic right thalamic lacunar infarct. 2. Mild sinus disease as above. Electronically Signed   By: Dorise Bullion III M.D   On: 02/20/2020 14:49   CT CERVICAL SPINE WO CONTRAST  Result Date: 03/09/2020 CLINICAL DATA:  Status post fall. EXAM: CT HEAD WITHOUT CONTRAST CT CERVICAL SPINE WITHOUT CONTRAST TECHNIQUE: Multidetector CT imaging of the head and cervical spine was performed following the standard protocol without intravenous contrast. Multiplanar CT image reconstructions of the cervical spine were also generated. COMPARISON:  February 20, 2020. FINDINGS: CT HEAD FINDINGS Brain: Mild chronic ischemic white matter disease. No mass effect or midline shift is noted. Ventricular size is within normal limits. There is no evidence of mass lesion, hemorrhage or acute infarction. Vascular: No hyperdense vessel or unexpected calcification. Skull: Normal. Negative for fracture or focal lesion. Sinuses/Orbits: No acute finding. Other: None. CT CERVICAL SPINE FINDINGS Alignment: Normal. Skull base and vertebrae: Stable erosive changes are seen involving the dens and anterior arch of C1 most consistent with degenerative change. Status post laminectomy extending from C4 to T1. Status post surgical posterior fusion extending from C4 to T1 with bilateral intrapedicular screw  placement at all levels except C7. Soft tissues and spinal canal: No prevertebral fluid or swelling. No visible canal hematoma. Disc levels: Severe degenerative disc disease is noted at C6-7 and C7-T1. Upper chest: Some degree of pleural effusion is seen involving the right lung apex. Other: None. IMPRESSION: 1.  Mild chronic ischemic white matter disease. No acute intracranial abnormality seen. 2. Postsurgical and degenerative changes as described above. No acute abnormality seen in the cervical spine. Electronically Signed   By: Marijo Conception M.D.   On: 03/09/2020 13:14   CT Cervical Spine Wo Contrast  Result Date: 02/20/2020 CLINICAL DATA:  COVID positive, recent neck surgery EXAM: CT CERVICAL SPINE WITHOUT CONTRAST TECHNIQUE: Multidetector CT imaging of the cervical spine was performed without intravenous contrast. Multiplanar CT image reconstructions were also generated. COMPARISON:  CT cervical spine, 01/28/2020 FINDINGS: Alignment: Normal. Skull base and vertebrae: No acute fracture. No primary bone lesion. Erosive change of the dens and adjacent transverse process of C1 are not significantly changed compared to prior examination. Soft tissues and spinal canal: Assessment of the soft tissues is somewhat limited by metallic streak artifact and lack of intravenous contrast; within this limitation, no unexpected fluid collection overlying the laminectomy site. Prevertebral soft tissue edema appears reduced compared to prior examination. No visible canal hematoma. Disc levels: Status post posterior laminectomy and fusion of C4 through T1, with moderate disc space height loss and osteophytosis of C6-C7 and C7-T1. Upper chest: Negative. Other: None. IMPRESSION: 1. Redemonstrated postoperative findings status post posterior laminectomy and fusion of C4 through T1, with moderate disc space height loss and osteophytosis of C6-C7 and C7-T1. 2. Prevertebral soft tissue edema appears reduced compared to prior  examination. 3. Postoperative fluid collection overlying laminectomy site does not appear significantly changed compared to prior examination. 4. Assessment of the soft tissues and epidural space is generally limited on noncontrast CT and additionally limited by metallic streak artifact; contrast enhanced MRI is the test of choice for evaluation of edema, fluid collections, the epidural space, and discitis osteomyelitis if clinically suspected in the postoperative setting. Electronically Signed   By: Eddie Candle M.D.   On: 02/20/2020 14:53   MR CERVICAL SPINE WO CONTRAST  Result Date: 02/22/2020 CLINICAL DATA:  69 year old male with history of dorsal cervical epidural abscess, status post operative drainage and decompression last month on 01/06/2020. Follow-up MRI 01/29/2020 with new left occipital condyle, anterior C1 and odontoid marrow edema and erosion compatible with skull base osteomyelitis. End stage renal disease on dialysis. Confusion. Positive COVID-19. EXAM: MRI CERVICAL SPINE WITHOUT CONTRAST TECHNIQUE: Multiplanar, multisequence MR imaging of the cervical spine was performed. No intravenous contrast was administered. COMPARISON:  Recent CT head and cervical spine 02/20/2020. Postoperative MRI 01/29/2020. Preoperative MRI FINDINGS: The examination had to be discontinued just prior to completion due to confusion, agitation. Only axial GRE imaging was not obtained. Alignment: Straightening and mild reversal of cervical lordosis remains improved since 01/04/2020. Vertebrae: Abnormal marrow edema and/or T2 and STIR hyperintense soft tissue surrounding the odontoid (series 3, image 7) which has been partially eroded along with the adjacent clivus and anterior C1 ring as seen on the recent CT. Mild marrow edema in both occipital condyles, greater on the left. Faint marrow edema also in the C2-C3 facets, mostly on the right (series 3, image 2). Overall the appearance has not progressed since 01/22/2020.  Sequelae of posterior decompression and fusion from C3 through T1. Mild hardware susceptibility artifact. No other cervical or upper thoracic marrow edema or acute osseous abnormality. Cord: Suboptimal cord detail due to motion and hardware susceptibility. No definite cervical or upper thoracic spinal cord signal abnormality. Normalized epidural space since August. Posterior Fossa, vertebral arteries, paraspinal tissues: Major vascular flow voids are preserved in the neck. Postoperative changes to the posterior neck soft  tissues with no adverse features. Negative visible lung apices. Cervicomedullary junction and visible posterior fossa are stable and within normal limits. Disc levels: C2-C3: Improved thecal sac patency since 01/04/2020. Foraminal endplate spurring and moderate bilateral facet degeneration. Mild residual spinal stenosis. Mild bilateral C3 foraminal stenosis. C3-C4: Chronic central disc protrusion (series 2, image 7) with mild facet and ligament flavum hypertrophy. Improved thecal sac patency since 01/04/2020 but residual mild spinal stenosis and mild cord mass effect. Moderate bilateral C4 foraminal stenosis. C4-C5 through T1-T2: Posterior decompression with no significant spinal stenosis despite residual disc and endplate degeneration at some levels. There is mild to moderate bilateral C8 foraminal stenosis related to disc, endplate and residual facet hypertrophy at C7-T1. IMPRESSION: 1. 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 Possible involvement of the right C2-C3 facet joint also. Associated erosion of bone since 01/28/2020 as demonstrated by CT two days ago. But on MRI no other significant progression is identified since August; no new levels of involvement. 2. Resolved posterior epidural abscess following surgery last month. Satisfactorily decompressed levels C4-C5 through T1-T2. 3. Improved thecal sac patency but mild residual degenerative spinal stenosis at both C2-C3 and  C3-C4. No definite spinal cord signal abnormality. Electronically Signed   By: Genevie Ann M.D.   On: 02/22/2020 11:48   DG CHEST PORT 1 VIEW  Result Date: 03/01/2020 CLINICAL DATA:  Respiratory distress.  COVID. EXAM: PORTABLE CHEST 1 VIEW COMPARISON:  02/24/2020 FINDINGS: The endotracheal tube is been removed in the enteric tube converted for a feeding tube. Enteric tube tip is off the field of view but below the left hemidiaphragm. Right central venous catheter is unchanged in position. No pneumothorax. Cardiac enlargement. Bilateral basilar infiltrates suggesting multifocal pneumonia. IMPRESSION: Bilateral basilar infiltrates suggesting multifocal pneumonia. Electronically Signed   By: Lucienne Capers M.D.   On: 03/01/2020 05:02   DG CHEST PORT 1 VIEW  Result Date: 02/24/2020 CLINICAL DATA:  COVID-19 positive 02/20/2020, respiratory failure, intubated EXAM: PORTABLE CHEST 1 VIEW COMPARISON:  02/23/2020 FINDINGS: Single frontal view of the chest demonstrates endotracheal tube well above carina. Enteric catheter passes below diaphragm tip excluded by collimation. Bilateral internal jugular catheters are unchanged. Cardiac silhouette is stable. Persistent patchy consolidation at the lung bases, unchanged. No effusion or pneumothorax. No acute bony abnormalities. IMPRESSION: 1. Stable support devices. 2. Persistent bibasilar patchy consolidation which could reflect atelectasis or pneumonia. Electronically Signed   By: Randa Ngo M.D.   On: 02/24/2020 18:07   DG CHEST PORT 1 VIEW  Result Date: 02/23/2020 CLINICAL DATA:  Encounter for central line placement EXAM: PORTABLE CHEST 1 VIEW COMPARISON:  02/21/2020 FINDINGS: Endotracheal tube approximately 2 cm above the carina. NG tube enters the stomach with the tip not visualized. Right jugular central venous catheter tip in the right atrium unchanged. New left jugular catheter with the tip in the proximal SVC. No pneumothorax. Mild right lower lobe  atelectasis. Negative for edema or effusion IMPRESSION: Left jugular central venous catheter tip in the SVC. No pneumothorax. Mild right lower lobe atelectasis. Endotracheal tube 2 cm above the carina. Electronically Signed   By: Franchot Gallo M.D.   On: 02/23/2020 15:14   DG Chest Port 1 View  Result Date: 02/20/2020 CLINICAL DATA:  COVID EXAM: PORTABLE CHEST 1 VIEW COMPARISON:  01/12/2020 FINDINGS: Interval extubation. Interval placement of large-bore right neck vascular catheter, tip position near the superior cavoatrial junction. The heart size and mediastinal contours are within normal limits. Both lungs are clear. The visualized  skeletal structures are unremarkable. IMPRESSION: 1. Interval placement of large-bore right neck vascular catheter, tip position near the superior cavoatrial junction. 2. No acute abnormality of the lungs in AP portable projection. Electronically Signed   By: Eddie Candle M.D.   On: 02/20/2020 13:43   DG Chest Port 1V same Day  Result Date: 03/02/2020 CLINICAL DATA:  Shortness of breath.  Altered mental status. EXAM: PORTABLE CHEST 1 VIEW COMPARISON:  Radiograph yesterday. FINDINGS: Enteric tube remains in place with tip below the diaphragm. Right internal jugular dialysis catheter unchanged. Improving cardiomegaly with stable mediastinal contours. Persistent but improving patchy bibasilar opacities. No pneumothorax. No large pleural effusion. Surgical hardware in the lower cervical spine is partially included. IMPRESSION: 1. Persistent but improving patchy bibasilar opacities, atelectasis versus pneumonia. 2. Improving cardiomegaly. Electronically Signed   By: Keith Rake M.D.   On: 03/02/2020 15:17   DG Chest Port 1V same Day  Result Date: 02/21/2020 CLINICAL DATA:  Shortness of breath, COVID EXAM: PORTABLE CHEST 1 VIEW COMPARISON:  02/20/2020 FINDINGS: Right dialysis catheter remains in place, unchanged. Heart is normal size. No confluent airspace opacities or  effusions. No acute bony abnormality. IMPRESSION: No acute cardiopulmonary disease. Electronically Signed   By: Rolm Baptise M.D.   On: 02/21/2020 13:03   DG Swallowing Func-Speech Pathology  Result Date: 03/03/2020 Objective Swallowing Evaluation: Type of Study: MBS-Modified Barium Swallow Study  Patient Details Name: [name]. MRN: [id number] Date of Birth: 1950-09-18 Today's Date: 03/03/2020 Time: SLP Start Time (ACUTE ONLY): 7902 -SLP Stop Time (ACUTE ONLY): 1418 SLP Time Calculation (min) (ACUTE ONLY): 24 min Past Medical History: Past Medical History: Diagnosis Date  Anemia   Arthritis   left knee  Chronic kidney disease   acute renal failure/injury requiring short-term HD 2013  Colon cancer ([place])   Colon polyp   11/2011 - Polyps identified, biopsy - invasive adenocarinoma  DM II (diabetes mellitus, type II), controlled ([place])   type 2 IDDM x 15 years. A1C 1/09 13.7.   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 12/25/2011  GERD (gastroesophageal reflux disease)   Heart murmur   Hx of ileostomy 10/13/2012  Hyperlipidemia   Hypertension   for a few years and resolved in 2013/2014.  Sleep apnea   does not wear CPAP now  Wears glasses  Past Surgical History: Past Surgical History: Procedure Laterality Date  APPLICATION OF WOUND VAC  09/10/7351  Procedure: APPLICATION OF WOUND VAC;  Surgeon: Zenovia Jarred, MD;  Location: [facility];  Service: General;  Laterality: N/A;  APPLICATION OF WOUND VAC  2/99/2426  Procedure: APPLICATION OF WOUND VAC;  Surgeon: Imogene Burn. [name], MD;  Location: [facility];  Service: General;  Laterality: N/A;  AV FISTULA PLACEMENT Left 07/29/2019  Procedure: ARTERIOVENOUS (AV) FISTULA CREATION;  Surgeon: Marty Heck, MD;  Location: [facility];  Service: Vascular;  Laterality: Left;  COLONOSCOPY    COLONOSCOPY  02/06/2012  Procedure: COLONOSCOPY;  Surgeon: Milus Banister, MD;  Location: [facility];  Service: Endoscopy;;  COLONOSCOPY WITH PROPOFOL N/A 06/10/2019   Procedure: COLONOSCOPY WITH PROPOFOL;  Surgeon: Milus Banister, MD;  Location: [facility];  Service: Endoscopy;  Laterality: N/A;  COLOSTOMY REVERSAL    DRESSING CHANGE UNDER ANESTHESIA  02/18/2012  Procedure: DRESSING CHANGE UNDER ANESTHESIA;  Surgeon: Odis Hollingshead, MD;  Location: [facility];  Service: General;  Laterality: N/A;  ESOPHAGOGASTRODUODENOSCOPY (EGD) WITH PROPOFOL N/A 06/10/2019  Procedure: ESOPHAGOGASTRODUODENOSCOPY (EGD) WITH PROPOFOL;  Surgeon: Milus Banister, MD;  Location: [facility];  Service: Endoscopy;  Laterality: N/A;  GANGLION CYST EXCISION  20 YRS AGO  RT ARM  ILEOSTOMY  02/16/2012  Procedure: ILEOSTOMY;  Surgeon: Zenovia Jarred, MD;  Location: [facility];  Service: General;  Laterality: N/A;  ILEOSTOMY CLOSURE N/A 09/02/2012  Procedure: ILEOSTOMY REVERSAL;  Surgeon: Imogene Burn. [name], MD;  Location: [facility];  Service: General;  Laterality: N/A;  ILIOSTOMY    INCISION AND DRAINAGE OF WOUND  02/23/2012  Procedure: IRRIGATION AND DEBRIDEMENT WOUND;  Surgeon: Joyice Faster. Cornett, MD;  Location: [facility];  Service: General;  Laterality: N/A;  IR DIALY SHUNT INTRO NEEDLE/INTRACATH INITIAL W/IMG LEFT Left 01/21/2020  IR FLUORO GUIDE CV LINE RIGHT  01/21/2020  IR REMOVAL TUN CV CATH W/O FL  01/07/2020  IR US GUIDE VASC ACCESS LEFT  01/21/2020  IR US GUIDE VASC ACCESS RIGHT  01/21/2020  LAPAROTOMY  02/16/2012  Procedure: EXPLORATORY LAPAROTOMY;  Surgeon: Zenovia Jarred, MD;  Location: [facility];  Service: General;  Laterality: N/A;  Exploratory Laparotomy with resection of anastomosis  LAPAROTOMY  02/18/2012  Procedure: EXPLORATORY LAPAROTOMY;  Surgeon: Odis Hollingshead, MD;  Location: [facility];  Service: General;  Laterality: N/A;  exploratory laparotomy  change of abdominal vac dressing  LAPAROTOMY  02/20/2012  Procedure: EXPLORATORY LAPAROTOMY;  Surgeon: Odis Hollingshead, MD;  Location: [facility];  Service: General;  Laterality: N/A;  LAPAROTOMY  02/23/2012  Procedure: EXPLORATORY LAPAROTOMY;  Surgeon:  Joyice Faster. Cornett, MD;  Location: [facility];  Service: General;  Laterality: N/A;  Irrigation and Debridement of abdominal wound with wound vac change with partial closure  LAPAROTOMY  02/26/2012  Procedure: EXPLORATORY LAPAROTOMY;  Surgeon: Imogene Burn. [name], MD;  Location: [facility];  Service: General;  Laterality: N/A;    PARTIAL COLECTOMY  02/06/2012  PARTIAL COLECTOMY  02/06/2012  Procedure: PARTIAL COLECTOMY;  Surgeon: Imogene Burn. [name], MD;  Location: [place] [facility];  Service: General;  Laterality: N/A;  right partial colectomy  POSTERIOR CERVICAL FUSION/FORAMINOTOMY N/A 01/06/2020  Procedure: CERVICAL FOUR- THORACIC TWO LAMINECTOMY AND FUSION;  Surgeon: Consuella Lose, MD;  Location: [facility];  Service: Neurosurgery;  Laterality: N/A;  CERVICAL FOUR- THORACIC TWO LAMINECTOMY AND FUSION  RESECTION SMALL BOWEL / CLOSURE ILEOSTOMY  402/2014  Dr [name]  VACUUM ASSISTED CLOSURE CHANGE  02/20/2012  Procedure: ABDOMINAL VACUUM ASSISTED CLOSURE CHANGE;  Surgeon: Odis Hollingshead, MD;  Location: [facility];  Service: General;;  VACUUM ASSISTED CLOSURE CHANGE  02/23/2012  Procedure: ABDOMINAL VACUUM ASSISTED CLOSURE CHANGE;  Surgeon: Joyice Faster. Cornett, MD;  Location: [facility];  Service: General;  Laterality: N/A; HPI: [name]. is a 69 y.o. male with medical history significant of GERD, hypertension, hyperlipidemia ESRD on hemodialysis, type 2 diabetes mellitus, anemia of chronic disease, colon cancer status post resection, epidural abscess status post laminectomy, posterior cervical fusion on 01/06/2020 and MRSA bacteremia and presented emergently for evaluation of confusion and COVID-19 positive. Initial BSE 02/21/20 was limited- needed enouragement, audible swallow, eructation, poor dentition and reduced endurance, Work of breathing was stable. Dys 2/thin recommended. On 9/22 became unresponsive after HD, transferred to ICU and intubated then extubated 9/25. Currently has NGT, encephalopathy improving per MD note and speech to  re-eval. CXR Bilateral basilar infiltrates suggesting multifocal pneumonia.  No data recorded Assessment / Plan / Recommendation CHL IP CLINICAL IMPRESSIONS 03/03/2020 Clinical Impression Pt demonstrates mild orpharyngeal dysphagia mostly related to anatomical differences. His cervical spine has a khyphotic curvature that widens his pharyngeal area preventing full closure of  larynx and adequate pharyngeal contraction. In addition, initiation of swallow is intermittently late with boluses reaching the pyriform sinuses for up to 5 seconds prior to swallow. His epiglottis at times does not fully invert adding to inadequate protection. Thin barium was penetrated before and during the swallow remaining superior to the vocal cords. His reflexive throat clear, present most of the time did not clear vestibule and a chin tuck was not effective. Penetration was present once with nectar at end of study. Nectar thick recommended for pt to initiate diet and upgrade to thin from clinical observation with SLP. Dys 2 texture recommended, crush pills, small sips/bites, intermittent throat clear and avoid straws.    SLP Visit Diagnosis Dysphagia, pharyngeal phase (R13.13) Attention and concentration deficit following -- Frontal lobe and executive function deficit following -- Impact on safety and function Moderate aspiration risk   CHL IP TREATMENT RECOMMENDATION 03/03/2020 Treatment Recommendations Therapy as outlined in treatment plan below   Prognosis 03/03/2020 Prognosis for Safe Diet Advancement Good Barriers to Reach Goals -- Barriers/Prognosis Comment -- CHL IP DIET RECOMMENDATION 03/03/2020 SLP Diet Recommendations Dysphagia 2 (Fine chop) solids;Nectar thick liquid Liquid Administration via Cup Medication Administration Crushed with puree Compensations Slow rate;Small sips/bites;Clear throat intermittently Postural Changes Seated upright at 90 degrees   CHL IP OTHER RECOMMENDATIONS 03/03/2020 Recommended Consults -- Oral Care  Recommendations Oral care BID Other Recommendations Order thickener from pharmacy   CHL IP FOLLOW UP RECOMMENDATIONS 03/03/2020 Follow up Recommendations (No Data)   CHL IP FREQUENCY AND DURATION 03/03/2020 Speech Therapy Frequency (ACUTE ONLY) min 2x/week Treatment Duration 2 weeks      CHL IP ORAL PHASE 03/03/2020 Oral Phase Impaired Oral - Pudding Teaspoon -- Oral - Pudding Cup -- Oral - Honey Teaspoon -- Oral - Honey Cup -- Oral - Nectar Teaspoon -- Oral - Nectar Cup WFL Oral - Nectar Straw -- Oral - Thin Teaspoon -- Oral - Thin Cup Decreased bolus cohesion Oral - Thin Straw -- Oral - Puree -- Oral - Mech Soft -- Oral - Regular Delayed oral transit Oral - Multi-Consistency -- Oral - Pill -- Oral Phase - Comment --  CHL IP PHARYNGEAL PHASE 03/03/2020 Pharyngeal Phase Impaired Pharyngeal- Pudding Teaspoon -- Pharyngeal -- Pharyngeal- Pudding Cup -- Pharyngeal -- Pharyngeal- Honey Teaspoon -- Pharyngeal -- Pharyngeal- Honey Cup -- Pharyngeal -- Pharyngeal- Nectar Teaspoon -- Pharyngeal -- Pharyngeal- Nectar Cup Delayed swallow initiation-pyriform sinuses;Reduced epiglottic inversion;Reduced airway/laryngeal closure;Penetration/Aspiration during swallow;Pharyngeal residue - valleculae Pharyngeal Material enters airway, remains ABOVE vocal cords and not ejected out Pharyngeal- Nectar Straw -- Pharyngeal -- Pharyngeal- Thin Teaspoon -- Pharyngeal -- Pharyngeal- Thin Cup Delayed swallow initiation-pyriform sinuses;Pharyngeal residue - valleculae;Pharyngeal residue - pyriform;Penetration/Aspiration before swallow;Reduced airway/laryngeal closure Pharyngeal Material enters airway, remains ABOVE vocal cords and not ejected out Pharyngeal- Thin Straw -- Pharyngeal -- Pharyngeal- Puree -- Pharyngeal -- Pharyngeal- Mechanical Soft -- Pharyngeal -- Pharyngeal- Regular WFL Pharyngeal -- Pharyngeal- Multi-consistency -- Pharyngeal -- Pharyngeal- Pill -- Pharyngeal -- Pharyngeal Comment --  CHL IP CERVICAL ESOPHAGEAL PHASE 03/03/2020  Cervical Esophageal Phase WFL Pudding Teaspoon -- Pudding Cup -- Honey Teaspoon -- Honey Cup -- Nectar Teaspoon -- Nectar Cup -- Nectar Straw -- Thin Teaspoon -- Thin Cup -- Thin Straw -- Puree -- Mechanical Soft -- Regular -- Multi-consistency -- Pill -- Cervical Esophageal Comment -- [place] Siren 03/03/2020, 4:35 PM Orbie Pyo Litaker M.Ed Actor Pager [phone] Office [phone]              VAS [place] LOWER EXTREMITY  VENOUS (DVT)  Result Date: 02/23/2020  Lower Venous DVTStudy Indications: Elevated D dimer. Other Indications: + Covid. Risk Factors: None identified. Performing Technologist: Griffin Basil RCT RDMS  Examination Guidelines: A complete evaluation includes B-mode imaging, spectral Doppler, color Doppler, and power Doppler as needed of all accessible portions of each vessel. Bilateral testing is considered an integral part of a complete examination. Limited examinations for reoccurring indications may be performed as noted. The reflux portion of the exam is performed with the patient in reverse Trendelenburg.  +---------+---------------+---------+-----------+----------+--------------+  RIGHT     Compressibility Phasicity Spontaneity Properties Thrombus Aging  +---------+---------------+---------+-----------+----------+--------------+  CFV       Full            Yes       Yes                                    +---------+---------------+---------+-----------+----------+--------------+  SFJ       Full                                                             +---------+---------------+---------+-----------+----------+--------------+  FV Prox   Full                                                             +---------+---------------+---------+-----------+----------+--------------+  FV Mid    Full                                                             +---------+---------------+---------+-----------+----------+--------------+  FV Distal Full                                                              +---------+---------------+---------+-----------+----------+--------------+  PFV       Full                                                             +---------+---------------+---------+-----------+----------+--------------+  POP       Full            Yes       Yes                                    +---------+---------------+---------+-----------+----------+--------------+  PTV       Full                                                             +---------+---------------+---------+-----------+----------+--------------+  PERO      Full                                                             +---------+---------------+---------+-----------+----------+--------------+   +---------+---------------+---------+-----------+----------+--------------+  LEFT      Compressibility Phasicity Spontaneity Properties Thrombus Aging  +---------+---------------+---------+-----------+----------+--------------+  CFV       Full            Yes       Yes                                    +---------+---------------+---------+-----------+----------+--------------+  SFJ       Full                                                             +---------+---------------+---------+-----------+----------+--------------+  FV Prox   Full                                                             +---------+---------------+---------+-----------+----------+--------------+  FV Mid    Full                                                             +---------+---------------+---------+-----------+----------+--------------+  FV Distal Full                                                             +---------+---------------+---------+-----------+----------+--------------+  PFV       Full                                                             +---------+---------------+---------+-----------+----------+--------------+  POP       Full            Yes       Yes                                     +---------+---------------+---------+-----------+----------+--------------+  PTV       Full                                                             +---------+---------------+---------+-----------+----------+--------------+  PERO      Full                                                             +---------+---------------+---------+-----------+----------+--------------+     Summary: RIGHT: - There is no evidence of deep vein thrombosis in the lower extremity.  - No cystic structure found in the popliteal fossa.  LEFT: - There is no evidence of deep vein thrombosis in the lower extremity.  - No cystic structure found in the popliteal fossa.  *See table(s) above for measurements and observations. Electronically signed by Harold Barban MD on 02/23/2020 at 9:31:43 PM.    Final    US Abdomen Limited RUQ  Result Date: 02/23/2020 CLINICAL DATA:  Transaminitis EXAM: ULTRASOUND ABDOMEN LIMITED RIGHT UPPER QUADRANT COMPARISON:  None. FINDINGS: Gallbladder: The gallbladder was not visualized and may be surgically absent Common bile duct: Diameter: 3 mm Liver: The liver parenchyma is coarsened and heterogeneous. The liver surface appears somewhat nodular there is no discrete hepatic mass. There is suggestion of bidirectional flow within the portal vein Other: The right kidney appears atrophic and echogenic. There are multiple right-sided renal cysts measuring up to approximately 3.6 cm. IMPRESSION: 1. Gallbladder not visualized. 2. Coarsened and heterogeneous appearance of the liver parenchyma with findings suspicious for underlying cirrhosis. 3. Borderline bidirectional flow within the main portal vein is suggestive of portal hypertension. 4. Echogenic and atrophic appearing right kidney consistent with the patient's history of renal failure. Electronically Signed   By: Constance Holster M.D.   On: 02/23/2020 19:21

## 2020-03-11 NOTE — Progress Notes (Signed)
Patient requested early termination of Hemodialysis treatment stating that he was cold. A total of 5 warm blankets were placed on patient including around the neck area with an additional 5 on standby. Temperature in room was increased to highest setting. No signs of distress noted or change from baseline. Once the temperature issue was resolved pt stated "I just want to get off the machine" This nurse offered repositioning and more blankets but pt refused. Dr. Jonnie Finner notified and treatment was terminated.

## 2020-03-11 NOTE — Progress Notes (Signed)
Pt refused 2000 VS for NT and primary RN; also refusing to wear telemetry/continuous pulse ox. Pt also refused to allow RN to check his sacral/buttock area & reapply dressing that he pulled off earlier. Staff attempted to explain the importance of above noted interventions, pt cut staff off, stating "all y'all do is talk, talk, talk. Bye". Pt refused to continue conversation with staff at this time.   On call provider notified of above:  [phone] [place]: pt refusing all telemetry, continuous SpO2, drsng change to buttocks, VS, & meds. Not new for pt but wanted u to be aware.)

## 2020-03-12 DIAGNOSIS — G9341 Metabolic encephalopathy: Secondary | ICD-10-CM

## 2020-03-12 LAB — CBC
HCT: 25.9 % — ABNORMAL LOW (ref 39.0–52.0)
Hemoglobin: 8.4 g/dL — ABNORMAL LOW (ref 13.0–17.0)
MCH: 27.9 pg (ref 26.0–34.0)
MCHC: 32.4 g/dL (ref 30.0–36.0)
MCV: 86 fL (ref 80.0–100.0)
Platelets: 154 10*3/uL (ref 150–400)
RBC: 3.01 MIL/uL — ABNORMAL LOW (ref 4.22–5.81)
RDW: 19.2 % — ABNORMAL HIGH (ref 11.5–15.5)
WBC: 6.6 10*3/uL (ref 4.0–10.5)
nRBC: 0 % (ref 0.0–0.2)

## 2020-03-12 LAB — GLUCOSE, CAPILLARY
Glucose-Capillary: 113 mg/dL — ABNORMAL HIGH (ref 70–99)
Glucose-Capillary: 110 mg/dL — ABNORMAL HIGH (ref 70–99)
Glucose-Capillary: 159 mg/dL — ABNORMAL HIGH (ref 70–99)

## 2020-03-12 MED ORDER — QUETIAPINE FUMARATE 25 MG PO TABS
50.0000 mg | ORAL_TABLET | Freq: Two times a day (BID) | ORAL | Status: DC
  Administered 2020-03-12 – 2020-03-23 (×22): 50 mg via ORAL
  Filled 2020-03-12 (×24): qty 2

## 2020-03-12 MED ORDER — AMLODIPINE BESYLATE 10 MG PO TABS
10.0000 mg | ORAL_TABLET | Freq: Every day | ORAL | Status: DC
  Administered 2020-03-15 – 2020-03-21 (×7): 10 mg via ORAL
  Filled 2020-03-12 (×10): qty 1

## 2020-03-12 NOTE — Progress Notes (Signed)
[facility] Progress Note  Subjective:  Patient not examined today directly given COVID-19 + status, utilizing data taken from chart +/- discussions w/ providers and staff.   Pt seen by psychiatry today, pt refused to discuss anything w/ psychiatry.    Vitals:   03/11/20 1545 03/11/20 1704 03/11/20 1931 03/12/20 0515  BP: (!) 158/78 (!) 150/73  (!) 176/74  Pulse: 75 92 82 (!) 59  Resp: 18 18 18 18   Temp: 98.9 F (37.2 C) 98.7 F (37.1 C) 99.6 F (37.6 C) 98.4 F (36.9 C)  TempSrc:  Oral Oral Oral  SpO2:  96% 100% 91%  Weight:      Height:        Exam:  alert, nad   no jvd  Chest cta bilat  Cor reg no RG  Abd soft ntnd no ascites   Ext no LE edema   Alert, NF, ox3  +TDC/ L AVF+bruit     CXR 9/23 - patchy perihilar/ basilar dz, no edema      OP HD: [facility] MWF   4h 45min  87kg  2/2 bath  TDC / L AVF  Hep none  -Mircera 150 mcg IV q 2 weeks (last dose 02/09/2020) -Venofer 100 mg IV X 8 doses (not started yet) -Calcitriol 1.5 mcg PO TIW  Assessment/ Plan: 1. COVID PNA/ HCAP (EColi/ MSSA resp culture): Tested positive at [facility] 09/18 and again here. Got IV cefazolin thru 10/1. Remdesivir stopped 9/22 d/t ^LFT's.  COVID isolation ends today on 03/12/20.  2. Acute hypoxic resp failure.  At one point required mechanical ventilation. Resolved.  3. AMS - mentation seems much better. Pt continues to refuse some treatments however. Did complete HD yesterday. Seen by psychiatry today 10/10 and refused to discuss anything with them. Psychiatry suggesting guardianship. Per pmd.  4. Recent epidural abscess/MRSA cervical discitis-  S/p decompression surgery C4-T2 on 01/06/20 during prior admission. Pt admitted this time on 9/19 w/ AMS.  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  NSurg recommended no further surgery. Plans w/ ID were for Vancomycin and Rifampin here through 10/01 but rifampin dc'd early due to ^LFT"s. PO doxy started on 10/01 and pt is now  off IV vanc. Will f/u w/ ID clinic in 3-4 wks, they will guide on the stop date of the po doxy. 5.  ESRD - MWF HD. HIT testing was negative. OK to use heparin to block catheter now. HD tomorrow.  6.  HD access - L BC AVF placed Feb 2021. Was used starting in May and had infiltration in Aug prompting [facility] placement to rest the AVF. Have used AVF w/o issues on last 3 HD sessions. TDC would be ready for removal, but due to lack of good IV access have kept in for now.  7.  Hypertension/volume - Previously on high dose midodrine here and this is now off.  Keep even on HD, has not tolerated UF here lately.  8. Anemia  - aranesp increased to 150 mcg for 9/29 onward for now. Hold OP Fe load.  s/p PRBC's 9/29 and may need again soon  9.  Metabolic bone disease - off renvela due to hypophosphatemia and improved last check.  Anticipate need to resume at a lower dose per trends. on calcitriol - corrected Ca ok 10.  DM-per primary 11.  Low plts - 2nd level testing for HIT was negative.  52   H/o HCV/ liver cirrhosis - hep C testing  here was negative. Per pmd will need GI f/u in OP setting.  13.   Loose stools/ sacral wound - flexiseal in place now 14.  Dispo - awaiting SNF placement  Kelly Splinter, MD 03/12/2020, 1:55 PM       Recent Labs  Lab 03/08/20 0808 03/09/20 1031 03/10/20 1109 03/11/20 0233  K 3.8  --  4.2  --   BUN 40*  --  35*  --   CREATININE 8.04*  --  8.71*  --   CALCIUM 7.7*  --  7.5*  --   PHOS 5.1*  --  4.9*  --   HGB 5.9*   < > 7.9* 7.8*   < > = values in this interval not displayed.   Inpatient medications: . amLODipine  10 mg Oral Daily  . calcitRIOL  1.5 mcg Oral Q M,W,F-HD  . chlorhexidine  15 mL Mouth Rinse BID  . Chlorhexidine Gluconate Cloth  6 each Topical Q0600  . collagenase   Topical Daily  . darbepoetin (ARANESP) injection - DIALYSIS  150 mcg Intravenous Q Wed-HD  . doxycycline  100 mg Oral Q12H  . feeding supplement (NEPRO CARB STEADY)  237 mL Oral BID BM  .  insulin aspart  0-5 Units Subcutaneous QHS  . insulin aspart  0-9 Units Subcutaneous TID WC  . mouth rinse  15 mL Mouth Rinse q12n4p  . multivitamin  1 tablet Oral QHS  . pantoprazole  40 mg Oral Daily  . QUEtiapine  50 mg Oral BID  . thiamine  100 mg Oral Daily  . zinc sulfate  220 mg Oral Daily    haloperidol lactate, lip balm, loperamide, metoprolol tartrate, [DISCONTINUED] ondansetron **OR** ondansetron (ZOFRAN) IV, Resource ThickenUp Clear   Sol Blazing, MD 03/12/2020  1:55 PM

## 2020-03-12 NOTE — Progress Notes (Addendum)
Patient sitting on the side of bed, legs between lower rails. RN asked patient to lay back down or if he needs help sitting up in chair several times patient stated "no, now please leave me alone, am fine and I'm not going to argue with you". Bed in lowest position, matt on floor, call bell within reach. This patient is at high fall risk d/t previous fall on this admission but patient is not willing to corporate with staff.

## 2020-03-12 NOTE — Progress Notes (Signed)
Patient refused his morning meds including imodium after requesting for nurse to give him something for his upset stomach. He also refused  tele, v/s and sacral dressing change. Education given, pt stated "I don't want to hear that".

## 2020-03-12 NOTE — Progress Notes (Signed)
This RN called patient daughter [name] to help talk to patient to corporate with staff and asked if she would like to come and see her dad. Daughter stated she's fine with that but can't come until her father is transferred to a non-covid room d/t her health (still awaiting for  available bed on non-covid). Patient started calling nurse names stating " I will be fine if this bitch can leave me alone, I can refuse if I want, I dont have a mother in here". While talking to his daughter on the phone. Daughter apologized to nurse. Patient is still sitting on the side of the bed at this time. Bed in lowest position, matt in placed, call bell within reach.

## 2020-03-12 NOTE — Progress Notes (Addendum)
PROGRESS NOTE                                                                                                                                                                                                             Patient Demographics:    [name], is a 69 y.o. male, DOB - 1951-04-23, MOL:[id number]  Outpatient Primary MD for the patient is [facility], [facility]   Admit date - 02/20/2020   LOS - 21  Chief Complaint  Patient presents with  . Covid Positive  . AMS       Brief Narrative: Patient is a 69 y.o. male with PMHx of ESRD on HD MWF, DM-2, MRSA bacteremia with epidural abscess s/p C4-T2 laminectomy/fusion on 01/06/2020-on IV vancomycin/rifampin through 10/1-resident of SNF-apparently tested positive for COVID-19 on 9/18-subsequently brought to the ED on 9/19 for worsening confusion (more than baseline)-fever-thought to have acute metabolic encephalopathy from COVID-19 infection and admitted to the hospitalist service.  Further hospital course complicated by development of hypotension/severe encephalopathy requiring intubation and ICU transfer.  See below for further details.  COVID-19 vaccinated status: Unvaccinated  Significant Events: 8/3-8/23>> hospitalized for epidural abscess/MRSA bacteremia-underwent laminectomy-briefly intubated due to altered mental status.  Discharged to SNF 9/18>> COVID-19 positive at SNF 9/19>> Admit to [facility] for fever/confusion 9/22>> obtunded/hypotensive after coming back from hemodialysis-emergently intubated/transferred to ICU 9/30>> transferred back to [facility] 9/30>> SQ heparin stopped-pharmacy consulted for HIT protocol.  Significant studies: 8/6>> Echo: EF> 75% 9/19>>Chest x-ray: No acute abnormality in the lung. 9/19>> CT head: No acute intracranial abnormality 9/19>> CT C-spine: Postoperative findings of posterior laminectomy/fusion C4-T1, prevertebral soft tissue edema  appears reduced, postoperative fluid collection overlies laminectomy site does not appear significantly changed compared to prior examination. 9/20>> MRI C-spine: Attempted but due to AMS-not completed 9/21>> MRI C Spine: Osteomyelitis of skull base, anterior C1 and odontoid, C2-C3 facet joint-associated erosion of bone since 8/27-resolved epidural abscess 9/22>> coarsened/heterogeneous appearance of the liver parenchyma-suspicious for cirrhosis, gallbladder not visualized. 9/22>> bilateral lower extremity Doppler: No DVT 9/29>> chest x-ray: Bibasilar infiltrates suggesting multifocal pneumonia  COVID-19 medications: Remdesivir: 9/19>> 9/21  Antibiotics: IV vancomycin plan to continue through October 1 (rifampin stopped 9/21 due to elevated LFTs) Ancef: 9/26>> Zosyn: 9/24>> 9/26  Microbiology data: 9/20 >>blood culture: No growth so far 9/21>> blood culture: No growth so far 9/24>> sputum  culture: E. coli/MSSA  Procedures: ET tube 9/22 > 9/25  Consults: Nephrology PCCM GI  DVT prophylaxis: heparin injection 5,000 Units Start: 02/20/20>> stopped on 9/30 due to worsening thrombocytopenia. SCDs Start: 02/20/20 1628   Subjective:   Patient in bed, appears comfortable, denies any headache, no fever, no chest pain or pressure, no shortness of breath , no abdominal pain. No focal weakness.    Assessment  & Plan :   Acute hypoxic respiratory failure secondary to HCAP (E. coli/MSSA in sputum culture) and COVID-19 pneumonia Initially admitted and thought to have mild disease. Started on steroid/Remdesivir. But decompensated on 9/22 requiring intubation and ICU transfer.  Extubated 9/26.  Found to have MSSA in the sputum culture.  Given intravenous cefazolin till 10/1. Remdesivir stopped on 9/22 due to significantly elevated LFTs.    Respiratory status remained stable.  He is saturating normal on room air. Tested positive on 9/19.  He will end COVID-19 isolation on 10/10.  Fall -  Patient sustained a fall few days ago .  No injuries were noted.  CT scan of the head and neck unremarkable for any acute process.  He does have excoriation in the posterior right scalp.  No active bleeding noted.  Acute metabolic encephalopathy Multifactorial etiology-on initial presentation to the hospital was felt to be due to uremia (due to under dialysis, patient got short outpatient HD numerous times), sepsis physiology from COVID-19. However his mental status markedly deteriorated on 9/22 likely due to worsening sepsis physiology from HCAP.  Post extubation has had issues with delirium/likely related to intubation/ICU stay.  Required Precedex while in the ICU.  Patient has no longer encephalopathic but has been refusing treatments and medications and blood draws.  He was started on Seroquel.  Haldol as needed.  Psychiatry was consulted however they have been trying only televisits due to his COVID-19 status.  He has not been fully cooperative with them.  Psychiatry did recommend increasing dose of Seroquel.  Patient will come off isolation on 10/10 and maybe psychiatry can do face to face visit at that time.  Patient refuses to accept treatment plan and medications.  He does not understand the nature of his disease process and the need for hemodialysis.  He lacks an appreciation of the nature of his underlying medical condition.  He is unable to rationally manipulate information to make an informed decision.  I believe that the patient does NOT have the capacity to make decisions for himself at this time. Formal psych consult face-to-face will be requested on 03/12/2020 he has finished his COVID-19 isolation.  He apparently has short-term memory issues. There was some thought as to whether patient has underlying dementia. Has a history of alcohol/cocaine use in the past.  Continue thiamine.   Dysphagia - He was getting tube feedings however the tube came out on 10/3.  He was started on a dysphagia  diet.  Seems to have improved.  Tolerating diet better than before. Speech therapy following.  Elevated D-dimer - Secondary to COVID-19.  He did not have any significant hypoxia.  Lower extremity Dopplers were negative.  Since he has clinically improved will not pursue any further work-up.  D-dimer was 6.16 on October 1.  Clinically has improved.    History of MRSA bacteremia with epidural abscess requiring C4-T2 laminectomy ( 01/06/2020 by Dr Legrand Pitts residual osteomyelitis of the skull base :  MRI C-spine completed on 9/21. Appears to have residual osteomyelitis of the skull base area. Was discussed with neurosurgeon  Dr. Kathyrn Sheriff on 9/21. No further surgical intervention required.  Plans were to continue vancomycin/rifampin through October 1-however rifampin discontinued due to significantly elevated LFTs.    Patient now off of vancomycin.  Started on doxycycline from 10/1, plan per primary critical care and ID pharmacist is to follow with ID clinic in 3 to 4 weeks and then they will guide on the stop date.  Patient will need follow-up with infectious disease clinic post discharge.  Will also need follow-up with neurosurgery.  Continues to have staples in his neck area, neurosurgery requested to evaluate on 03/12/2020.  Septic shock, resolved - Required pressors while in the ICU. Thought to be most likely secondary to HCAP although patient has a history of recent MRSA bacteremia/cervical spine osteomyelitis/abscess.   Thrombocytopenia/HIT has been ruled out - Suspect this is multifactorial in etiology from acute , illness/sepsis/thrombocytopenia-potentially some contribution from underlying liver cirrhosis-HIT ruled out and platelet counts have stabilized.  Normocytic Anemia - Secondary to ESRD. Worsened by acute illness-s/p 3 unit of PRBC infusions this admission last transfusion on 03/08/2020.  Currently stable.  He did have some bleeding around her dialysis site which is now stable. Hemoglobin  noted to be 5.9 on 10/6  Shock liver - LFTs have normalized.    History of HCV/liver cirrhosis - HCV antibody positive in the past.  Viral load was done and no hepatitis C quantitative was detected.   Liver ultrasound with suggestion of cirrhotic appearance.  Will need outpatient GI follow-up.  ESRD on HD MWF - Nephrology following.  Patient being dialyzed as per his usual schedule.  Patient has been curtailing his dialysis treatment.  Has not been cooperative.  Sacral wound/loose stools - Wound care is following for sacral wounds.  Currently has a Flexi-Seal.  Mainly present to protect the wounds that he has in the lower back area.  Imodium as needed.     Metabolic acidosis - Likely secondary to incomplete HD treatments. Resolved with regular HD sessions.  Hyperkalemia - Likely secondary to incomplete dialysis treatments, resolved with hemodialysis  Essential hypertension - added Norvasc and monitor blood pressures have stabilized.  Hyperlipidemia - Statin on hold-should be able to resume when closer to discharge.  Questionable dementia - Discussed with patient's daughter.  She is not certain if patient truly has cognitive impairment or dementia.  Psychiatry consulted.  Deconditioning/debility: Secondary to acute illness on top of chronic debility.  PT eval completed-SNF on discharge.  DM-2 (A1c 6.5 on 8/11):  Appears to have brittle diabetes resulting in hypoglycemic episodes while in the ICU.  CBGs noted to be borderline low.  Discontinued Levemir to avoid hypoglycemia.    CBG (last 3)  Recent Labs    03/11/20 1113 03/11/20 2031 03/12/20 0638  GLUCAP 159* 125* 113*     GI prophylaxis: PPI  Condition - Stable  Family Communication  :  Daughter - Renee [phone]) on 03/12/2020.  Code Status :  Full Code  Diet :  Diet Order            DIET DYS 3 Room service appropriate? No; Fluid consistency: Thin  Diet effective now                  Disposition Plan  : Plan  is for the patient to go to skilled nursing facility when medically improved.  His refusal of medical treatment has been a major challenge.  Status is: Inpatient  Remains inpatient appropriate because:Inpatient level of care appropriate due to  severity of illness  Dispo: The patient is from: SNF              Anticipated d/c is to: SNF              Anticipated d/c date is: Hopefully by the end of this week              Patient currently is not medically stable to d/c.    Antimicorbials  :    Anti-infectives (From admission, onward)   Start     Dose/Rate Route Frequency Ordered Stop   03/06/20 2000  doxycycline (VIBRA-TABS) tablet 100 mg        100 mg Oral Every 12 hours 03/06/20 1632     03/04/20 1000  doxycycline (VIBRA-TABS) tablet 100 mg  Status:  Discontinued        100 mg Oral Every 12 hours 03/01/20 0944 03/04/20 0741   03/04/20 1000  doxycycline (VIBRA-TABS) tablet 100 mg  Status:  Discontinued        100 mg Per Tube Every 12 hours 03/04/20 0741 03/06/20 1632   02/27/20 1500  ceFAZolin (ANCEF) IVPB 1 g/50 mL premix        1 g 100 mL/hr over 30 Minutes Intravenous Every 24 hours 02/27/20 1433 03/02/20 1445   02/25/20 1000  piperacillin-tazobactam (ZOSYN) IVPB 2.25 g  Status:  Discontinued        2.25 g 100 mL/hr over 30 Minutes Intravenous Every 8 hours 02/25/20 0905 02/27/20 1433   02/25/20 0830  piperacillin-tazo (ZOSYN) NICU IV syringe 225 mg/mL  Status:  Discontinued        100 mg/kg of piperacillin  84.3 kg 84.4 mL/hr over 30 Minutes Intravenous Every 8 hours 02/25/20 0825 02/25/20 0905   02/25/20 0814  vancomycin (VANCOCIN) 1-5 GM/200ML-% IVPB       Note to Pharmacy: Cherylann Banas   : cabinet override      02/25/20 0814 02/25/20 1158   02/23/20 0642  vancomycin (VANCOCIN) 1-5 GM/200ML-% IVPB       Note to Pharmacy: [place], [place] : cabinet override      02/23/20 0642 02/23/20 1859   02/21/20 1200  vancomycin (VANCOCIN) IVPB 1000 mg/200 mL premix        1,000  mg 200 mL/hr over 60 Minutes Intravenous Every M-W-F (Hemodialysis) 02/20/20 1959 03/03/20 1130   02/21/20 1000  remdesivir 100 mg in sodium chloride 0.9 % 100 mL IVPB  Status:  Discontinued       "Followed by" Linked Group Details   100 mg 200 mL/hr over 30 Minutes Intravenous Daily 02/20/20 1632 02/23/20 0633   02/20/20 2000  rifampin (RIFADIN) capsule 300 mg  Status:  Discontinued        300 mg Oral Every 12 hours 02/20/20 1907 02/23/20 0633   02/20/20 1730  remdesivir 200 mg in sodium chloride 0.9% 250 mL IVPB       "Followed by" Linked Group Details   200 mg 580 mL/hr over 30 Minutes Intravenous Once 02/20/20 1632 02/20/20 1913      Inpatient Medications  Scheduled Meds: . calcitRIOL  1.5 mcg Oral Q M,W,F-HD  . chlorhexidine  15 mL Mouth Rinse BID  . Chlorhexidine Gluconate Cloth  6 each Topical Q0600  . collagenase   Topical Daily  . darbepoetin (ARANESP) injection - DIALYSIS  150 mcg Intravenous Q Wed-HD  . doxycycline  100 mg Oral Q12H  . feeding supplement (NEPRO CARB STEADY)  237 mL Oral BID BM  .  insulin aspart  0-5 Units Subcutaneous QHS  . insulin aspart  0-9 Units Subcutaneous TID WC  . mouth rinse  15 mL Mouth Rinse q12n4p  . multivitamin  1 tablet Oral QHS  . pantoprazole  40 mg Oral Daily  . QUEtiapine  25 mg Oral BID  . thiamine  100 mg Oral Daily  . zinc sulfate  220 mg Oral Daily   Continuous Infusions:  PRN Meds:.haloperidol lactate, lip balm, loperamide, metoprolol tartrate, ondansetron **OR** ondansetron (ZOFRAN) IV, Resource ThickenUp Clear    Lala Lund M.D on 03/12/2020 at 9:45 AM  To page go to [email or web address] - use universal password  [facility] -  Office  [phone]    Objective:   Vitals:   03/11/20 1545 03/11/20 1704 03/11/20 1931 03/12/20 0515  BP: (!) 158/78 (!) 150/73  (!) 176/74  Pulse: 75 92 82 (!) 59  Resp: 18 18 18 18   Temp: 98.9 F (37.2 C) 98.7 F (37.1 C) 99.6 F (37.6 C) 98.4 F (36.9 C)  TempSrc:  Oral  Oral Oral  SpO2:  96% 100% 91%  Weight:      Height:        Wt Readings from Last 3 Encounters:  03/11/20 85.3 kg  01/28/20 86.2 kg  01/24/20 89.4 kg     Intake/Output Summary (Last 24 hours) at 03/12/2020 0945 Last data filed at 03/11/2020 1545 Gross per 24 hour  Intake --  Output 47 ml  Net -47 ml     Physical Exam  Awake Alert, No new F.N deficits, C spine staples in place site appears clean [place].AT,PERRAL Supple Neck,No JVD, No cervical lymphadenopathy appriciated.  Symmetrical Chest wall movement, Good air movement bilaterally, CTAB RRR,No Gallops, Rubs or new Murmurs, No Parasternal Heave +ve B.Sounds, Abd Soft, No tenderness, No organomegaly appriciated, No rebound - guarding or rigidity. No Cyanosis, Clubbing or edema, No new Rash or bruise    Data Review:    CBC Recent Labs  Lab 03/05/20 1043 03/08/20 0808 03/09/20 1031 03/10/20 1109 03/11/20 0233  WBC 8.3 7.8 11.5* 7.1 6.7  HGB 8.1* 5.9* 8.8* 7.9* 7.8*  HCT 25.7* 18.4* 26.7* 24.9* 24.0*  PLT 94* 127* 150 139* 130*  MCV 85.1 84.0 84.8 85.6 84.5  MCH 26.8 26.9 27.9 27.1 27.5  MCHC 31.5 32.1 33.0 31.7 32.5  RDW 22.8* 22.4* 19.7* 19.6* 19.2*    Chemistries  Recent Labs  Lab 03/05/20 1043 03/08/20 0808 03/10/20 1109  NA 138 139 138  K 4.0 3.8 4.2  CL 101 103 103  CO2 23 23 25   GLUCOSE 134* 83 121*  BUN 49* 40* 35*  CREATININE 6.92* 8.04* 8.71*  CALCIUM 8.2* 7.7* 7.5*  AST 30  --   --   ALT 9  --   --   ALKPHOS 93  --   --   BILITOT 1.3*  --   --     Radiology Reports CT HEAD WO CONTRAST  Result Date: 03/09/2020 CLINICAL DATA:  Status post fall. EXAM: CT HEAD WITHOUT CONTRAST CT CERVICAL SPINE WITHOUT CONTRAST TECHNIQUE: Multidetector CT imaging of the head and cervical spine was performed following the standard protocol without intravenous contrast. Multiplanar CT image reconstructions of the cervical spine were also generated. COMPARISON:  February 20, 2020. FINDINGS: CT HEAD FINDINGS  Brain: Mild chronic ischemic white matter disease. No mass effect or midline shift is noted. Ventricular size is within normal limits. There is no evidence of mass lesion, hemorrhage or acute infarction. Vascular: No  hyperdense vessel or unexpected calcification. Skull: Normal. Negative for fracture or focal lesion. Sinuses/Orbits: No acute finding. Other: None. CT CERVICAL SPINE FINDINGS Alignment: Normal. Skull base and vertebrae: Stable erosive changes are seen involving the dens and anterior arch of C1 most consistent with degenerative change. Status post laminectomy extending from C4 to T1. Status post surgical posterior fusion extending from C4 to T1 with bilateral intrapedicular screw placement at all levels except C7. Soft tissues and spinal canal: No prevertebral fluid or swelling. No visible canal hematoma. Disc levels: Severe degenerative disc disease is noted at C6-7 and C7-T1. Upper chest: Some degree of pleural effusion is seen involving the right lung apex. Other: None. IMPRESSION: 1. Mild chronic ischemic white matter disease. No acute intracranial abnormality seen. 2. Postsurgical and degenerative changes as described above. No acute abnormality seen in the cervical spine. Electronically Signed   By: Marijo Conception M.D.   On: 03/09/2020 13:14   CT Head Wo Contrast  Result Date: 02/20/2020 CLINICAL DATA:  Delirium.  COVID-19 positive.  Recent neck surgery. EXAM: CT HEAD WITHOUT CONTRAST TECHNIQUE: Contiguous axial images were obtained from the base of the skull through the vertex without intravenous contrast. COMPARISON:  January 28, 2020 FINDINGS: Brain: No subdural, epidural, or subarachnoid hemorrhage. A lacunar infarct in the right thalamus is stable and nonacute. Moderate white matter changes again identified. No acute cortical ischemia or acute infarct noted. No mass effect or midline shift. Ventricles and sulci are unchanged. Cerebellum, brainstem, and basal cisterns are normal. Vascular:  Calcified atherosclerosis in the intracranial carotids. Skull: Normal. Negative for fracture or focal lesion. Sinuses/Orbits: Mild scattered mucosal thickening in the ethmoid air cells and left maxillary sinus. No air-fluid levels. Mastoid air cells and middle ears are well aerated. Other: None. IMPRESSION: 1. No acute intracranial abnormalities. Chronic white matter changes. Chronic right thalamic lacunar infarct. 2. Mild sinus disease as above. Electronically Signed   By: Dorise Bullion III M.D   On: 02/20/2020 14:49   CT CERVICAL SPINE WO CONTRAST  Result Date: 03/09/2020 CLINICAL DATA:  Status post fall. EXAM: CT HEAD WITHOUT CONTRAST CT CERVICAL SPINE WITHOUT CONTRAST TECHNIQUE: Multidetector CT imaging of the head and cervical spine was performed following the standard protocol without intravenous contrast. Multiplanar CT image reconstructions of the cervical spine were also generated. COMPARISON:  February 20, 2020. FINDINGS: CT HEAD FINDINGS Brain: Mild chronic ischemic white matter disease. No mass effect or midline shift is noted. Ventricular size is within normal limits. There is no evidence of mass lesion, hemorrhage or acute infarction. Vascular: No hyperdense vessel or unexpected calcification. Skull: Normal. Negative for fracture or focal lesion. Sinuses/Orbits: No acute finding. Other: None. CT CERVICAL SPINE FINDINGS Alignment: Normal. Skull base and vertebrae: Stable erosive changes are seen involving the dens and anterior arch of C1 most consistent with degenerative change. Status post laminectomy extending from C4 to T1. Status post surgical posterior fusion extending from C4 to T1 with bilateral intrapedicular screw placement at all levels except C7. Soft tissues and spinal canal: No prevertebral fluid or swelling. No visible canal hematoma. Disc levels: Severe degenerative disc disease is noted at C6-7 and C7-T1. Upper chest: Some degree of pleural effusion is seen involving the right  lung apex. Other: None. IMPRESSION: 1. Mild chronic ischemic white matter disease. No acute intracranial abnormality seen. 2. Postsurgical and degenerative changes as described above. No acute abnormality seen in the cervical spine. Electronically Signed   By: Bobbe Medico.D.  On: 03/09/2020 13:14   CT Cervical Spine Wo Contrast  Result Date: 02/20/2020 CLINICAL DATA:  COVID positive, recent neck surgery EXAM: CT CERVICAL SPINE WITHOUT CONTRAST TECHNIQUE: Multidetector CT imaging of the cervical spine was performed without intravenous contrast. Multiplanar CT image reconstructions were also generated. COMPARISON:  CT cervical spine, 01/28/2020 FINDINGS: Alignment: Normal. Skull base and vertebrae: No acute fracture. No primary bone lesion. Erosive change of the dens and adjacent transverse process of C1 are not significantly changed compared to prior examination. Soft tissues and spinal canal: Assessment of the soft tissues is somewhat limited by metallic streak artifact and lack of intravenous contrast; within this limitation, no unexpected fluid collection overlying the laminectomy site. Prevertebral soft tissue edema appears reduced compared to prior examination. No visible canal hematoma. Disc levels: Status post posterior laminectomy and fusion of C4 through T1, with moderate disc space height loss and osteophytosis of C6-C7 and C7-T1. Upper chest: Negative. Other: None. IMPRESSION: 1. Redemonstrated postoperative findings status post posterior laminectomy and fusion of C4 through T1, with moderate disc space height loss and osteophytosis of C6-C7 and C7-T1. 2. Prevertebral soft tissue edema appears reduced compared to prior examination. 3. Postoperative fluid collection overlying laminectomy site does not appear significantly changed compared to prior examination. 4. Assessment of the soft tissues and epidural space is generally limited on noncontrast CT and additionally limited by metallic streak  artifact; contrast enhanced MRI is the test of choice for evaluation of edema, fluid collections, the epidural space, and discitis osteomyelitis if clinically suspected in the postoperative setting. Electronically Signed   By: Eddie Candle M.D.   On: 02/20/2020 14:53   MR CERVICAL SPINE WO CONTRAST  Result Date: 02/22/2020 CLINICAL DATA:  69 year old male with history of dorsal cervical epidural abscess, status post operative drainage and decompression last month on 01/06/2020. Follow-up MRI 01/29/2020 with new left occipital condyle, anterior C1 and odontoid marrow edema and erosion compatible with skull base osteomyelitis. End stage renal disease on dialysis. Confusion. Positive COVID-19. EXAM: MRI CERVICAL SPINE WITHOUT CONTRAST TECHNIQUE: Multiplanar, multisequence MR imaging of the cervical spine was performed. No intravenous contrast was administered. COMPARISON:  Recent CT head and cervical spine 02/20/2020. Postoperative MRI 01/29/2020. Preoperative MRI FINDINGS: The examination had to be discontinued just prior to completion due to confusion, agitation. Only axial GRE imaging was not obtained. Alignment: Straightening and mild reversal of cervical lordosis remains improved since 01/04/2020. Vertebrae: Abnormal marrow edema and/or T2 and STIR hyperintense soft tissue surrounding the odontoid (series 3, image 7) which has been partially eroded along with the adjacent clivus and anterior C1 ring as seen on the recent CT. Mild marrow edema in both occipital condyles, greater on the left. Faint marrow edema also in the C2-C3 facets, mostly on the right (series 3, image 2). Overall the appearance has not progressed since 01/22/2020. Sequelae of posterior decompression and fusion from C3 through T1. Mild hardware susceptibility artifact. No other cervical or upper thoracic marrow edema or acute osseous abnormality. Cord: Suboptimal cord detail due to motion and hardware susceptibility. No definite cervical or  upper thoracic spinal cord signal abnormality. Normalized epidural space since August. Posterior Fossa, vertebral arteries, paraspinal tissues: Major vascular flow voids are preserved in the neck. Postoperative changes to the posterior neck soft tissues with no adverse features. Negative visible lung apices. Cervicomedullary junction and visible posterior fossa are stable and within normal limits. Disc levels: C2-C3: Improved thecal sac patency since 01/04/2020. Foraminal endplate spurring and moderate bilateral facet degeneration. Mild  residual spinal stenosis. Mild bilateral C3 foraminal stenosis. C3-C4: Chronic central disc protrusion (series 2, image 7) with mild facet and ligament flavum hypertrophy. Improved thecal sac patency since 01/04/2020 but residual mild spinal stenosis and mild cord mass effect. Moderate bilateral C4 foraminal stenosis. C4-C5 through T1-T2: Posterior decompression with no significant spinal stenosis despite residual disc and endplate degeneration at some levels. There is mild to moderate bilateral C8 foraminal stenosis related to disc, endplate and residual facet hypertrophy at C7-T1. IMPRESSION: 1. 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 Possible involvement of the right C2-C3 facet joint also. Associated erosion of bone since 01/28/2020 as demonstrated by CT two days ago. But on MRI no other significant progression is identified since August; no new levels of involvement. 2. Resolved posterior epidural abscess following surgery last month. Satisfactorily decompressed levels C4-C5 through T1-T2. 3. Improved thecal sac patency but mild residual degenerative spinal stenosis at both C2-C3 and C3-C4. No definite spinal cord signal abnormality. Electronically Signed   By: Genevie Ann M.D.   On: 02/22/2020 11:48   DG CHEST PORT 1 VIEW  Result Date: 03/01/2020 CLINICAL DATA:  Respiratory distress.  COVID. EXAM: PORTABLE CHEST 1 VIEW COMPARISON:  02/24/2020 FINDINGS: The  endotracheal tube is been removed in the enteric tube converted for a feeding tube. Enteric tube tip is off the field of view but below the left hemidiaphragm. Right central venous catheter is unchanged in position. No pneumothorax. Cardiac enlargement. Bilateral basilar infiltrates suggesting multifocal pneumonia. IMPRESSION: Bilateral basilar infiltrates suggesting multifocal pneumonia. Electronically Signed   By: Lucienne Capers M.D.   On: 03/01/2020 05:02   DG CHEST PORT 1 VIEW  Result Date: 02/24/2020 CLINICAL DATA:  COVID-19 positive 02/20/2020, respiratory failure, intubated EXAM: PORTABLE CHEST 1 VIEW COMPARISON:  02/23/2020 FINDINGS: Single frontal view of the chest demonstrates endotracheal tube well above carina. Enteric catheter passes below diaphragm tip excluded by collimation. Bilateral internal jugular catheters are unchanged. Cardiac silhouette is stable. Persistent patchy consolidation at the lung bases, unchanged. No effusion or pneumothorax. No acute bony abnormalities. IMPRESSION: 1. Stable support devices. 2. Persistent bibasilar patchy consolidation which could reflect atelectasis or pneumonia. Electronically Signed   By: Randa Ngo M.D.   On: 02/24/2020 18:07   DG CHEST PORT 1 VIEW  Result Date: 02/23/2020 CLINICAL DATA:  Encounter for central line placement EXAM: PORTABLE CHEST 1 VIEW COMPARISON:  02/21/2020 FINDINGS: Endotracheal tube approximately 2 cm above the carina. NG tube enters the stomach with the tip not visualized. Right jugular central venous catheter tip in the right atrium unchanged. New left jugular catheter with the tip in the proximal SVC. No pneumothorax. Mild right lower lobe atelectasis. Negative for edema or effusion IMPRESSION: Left jugular central venous catheter tip in the SVC. No pneumothorax. Mild right lower lobe atelectasis. Endotracheal tube 2 cm above the carina. Electronically Signed   By: Franchot Gallo M.D.   On: 02/23/2020 15:14   DG Chest  Port 1 View  Result Date: 02/20/2020 CLINICAL DATA:  COVID EXAM: PORTABLE CHEST 1 VIEW COMPARISON:  01/12/2020 FINDINGS: Interval extubation. Interval placement of large-bore right neck vascular catheter, tip position near the superior cavoatrial junction. The heart size and mediastinal contours are within normal limits. Both lungs are clear. The visualized skeletal structures are unremarkable. IMPRESSION: 1. Interval placement of large-bore right neck vascular catheter, tip position near the superior cavoatrial junction. 2. No acute abnormality of the lungs in AP portable projection. Electronically Signed   By: Cristie Hem  Laqueta Carina M.D.   On: 02/20/2020 13:43   DG Chest Port 1V same Day  Result Date: 03/02/2020 CLINICAL DATA:  Shortness of breath.  Altered mental status. EXAM: PORTABLE CHEST 1 VIEW COMPARISON:  Radiograph yesterday. FINDINGS: Enteric tube remains in place with tip below the diaphragm. Right internal jugular dialysis catheter unchanged. Improving cardiomegaly with stable mediastinal contours. Persistent but improving patchy bibasilar opacities. No pneumothorax. No large pleural effusion. Surgical hardware in the lower cervical spine is partially included. IMPRESSION: 1. Persistent but improving patchy bibasilar opacities, atelectasis versus pneumonia. 2. Improving cardiomegaly. Electronically Signed   By: Keith Rake M.D.   On: 03/02/2020 15:17   DG Chest Port 1V same Day  Result Date: 02/21/2020 CLINICAL DATA:  Shortness of breath, COVID EXAM: PORTABLE CHEST 1 VIEW COMPARISON:  02/20/2020 FINDINGS: Right dialysis catheter remains in place, unchanged. Heart is normal size. No confluent airspace opacities or effusions. No acute bony abnormality. IMPRESSION: No acute cardiopulmonary disease. Electronically Signed   By: Rolm Baptise M.D.   On: 02/21/2020 13:03   DG Swallowing Func-Speech Pathology  Result Date: 03/03/2020 Objective Swallowing Evaluation: Type of Study: MBS-Modified Barium  Swallow Study  Patient Details Name: Antwuan Eckley. MRN: [id number] Date of Birth: 11-Jan-1951 Today's Date: 03/03/2020 Time: SLP Start Time (ACUTE ONLY): 8242 -SLP Stop Time (ACUTE ONLY): 1418 SLP Time Calculation (min) (ACUTE ONLY): 24 min Past Medical History: Past Medical History: Diagnosis Date . Anemia  . Arthritis   left knee . Chronic kidney disease   acute renal failure/injury requiring short-term HD 2013 . Colon cancer ([place])  . Colon polyp   11/2011 - Polyps identified, biopsy - invasive adenocarinoma . DM II (diabetes mellitus, type II), controlled ([place])   type 2 IDDM x 15 years. A1C 1/09 13.7.  Marland Kitchen 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 12/25/2011 . GERD (gastroesophageal reflux disease)  . Heart murmur  . Hx of ileostomy 10/13/2012 . Hyperlipidemia  . Hypertension   for a few years and resolved in 2013/2014. . Sleep apnea   does not wear CPAP now . Wears glasses  Past Surgical History: Past Surgical History: Procedure Laterality Date . APPLICATION OF WOUND VAC  3/53/6144  Procedure: APPLICATION OF WOUND VAC;  Surgeon: Zenovia Jarred, MD;  Location: [place];  Service: General;  Laterality: N/A; . APPLICATION OF WOUND VAC  08/16/4006  Procedure: APPLICATION OF WOUND VAC;  Surgeon: Imogene Burn. [name], MD;  Location: [place];  Service: General;  Laterality: N/A; . AV FISTULA PLACEMENT Left 07/29/2019  Procedure: ARTERIOVENOUS (AV) FISTULA CREATION;  Surgeon: Marty Heck, MD;  Location: [place];  Service: Vascular;  Laterality: Left; . COLONOSCOPY   . COLONOSCOPY  02/06/2012  Procedure: COLONOSCOPY;  Surgeon: Milus Banister, MD;  Location: [place];  Service: Endoscopy;; . COLONOSCOPY WITH PROPOFOL N/A 06/10/2019  Procedure: COLONOSCOPY WITH PROPOFOL;  Surgeon: Milus Banister, MD;  Location: [facility];  Service: Endoscopy;  Laterality: N/A; . COLOSTOMY REVERSAL   . DRESSING CHANGE UNDER ANESTHESIA  02/18/2012  Procedure: DRESSING CHANGE UNDER ANESTHESIA;  Surgeon: Odis Hollingshead, MD;   Location: [place];  Service: General;  Laterality: N/A; . ESOPHAGOGASTRODUODENOSCOPY (EGD) WITH PROPOFOL N/A 06/10/2019  Procedure: ESOPHAGOGASTRODUODENOSCOPY (EGD) WITH PROPOFOL;  Surgeon: Milus Banister, MD;  Location: [facility];  Service: Endoscopy;  Laterality: N/A; . GANGLION CYST EXCISION  20 YRS AGO  RT ARM . ILEOSTOMY  02/16/2012  Procedure: ILEOSTOMY;  Surgeon: Zenovia Jarred, MD;  Location: [place];  Service:  General;  Laterality: N/A; . ILEOSTOMY CLOSURE N/A 09/02/2012  Procedure: ILEOSTOMY REVERSAL;  Surgeon: Imogene Burn. [name], MD;  Location: [place];  Service: General;  Laterality: N/A; . ILIOSTOMY   . INCISION AND DRAINAGE OF WOUND  02/23/2012  Procedure: IRRIGATION AND DEBRIDEMENT WOUND;  Surgeon: Joyice Faster. Cornett, MD;  Location: [place];  Service: General;  Laterality: N/A; . IR DIALY SHUNT INTRO [place] W/IMG LEFT Left 01/21/2020 . IR FLUORO GUIDE CV LINE RIGHT  01/21/2020 . IR REMOVAL TUN CV CATH W/O FL  01/07/2020 . IR US GUIDE VASC ACCESS LEFT  01/21/2020 . IR US GUIDE VASC ACCESS RIGHT  01/21/2020 . LAPAROTOMY  02/16/2012  Procedure: EXPLORATORY LAPAROTOMY;  Surgeon: Zenovia Jarred, MD;  Location: [place];  Service: General;  Laterality: N/A;  Exploratory Laparotomy with resection of anastomosis . LAPAROTOMY  02/18/2012  Procedure: EXPLORATORY LAPAROTOMY;  Surgeon: Odis Hollingshead, MD;  Location: [place];  Service: General;  Laterality: N/A;  exploratory laparotomy  change of abdominal vac dressing . LAPAROTOMY  02/20/2012  Procedure: EXPLORATORY LAPAROTOMY;  Surgeon: Odis Hollingshead, MD;  Location: [place];  Service: General;  Laterality: N/A; . LAPAROTOMY  02/23/2012  Procedure: EXPLORATORY LAPAROTOMY;  Surgeon: Joyice Faster. Cornett, MD;  Location: [place];  Service: General;  Laterality: N/A;  Irrigation and Debridement of abdominal wound with wound vac change with partial closure . LAPAROTOMY  02/26/2012  Procedure: EXPLORATORY LAPAROTOMY;  Surgeon: Imogene Burn. [name], MD;  Location: [place];   Service: General;  Laterality: N/A;   . PARTIAL COLECTOMY  02/06/2012 . PARTIAL COLECTOMY  02/06/2012  Procedure: PARTIAL COLECTOMY;  Surgeon: Imogene Burn. [name], MD;  Location: [place];  Service: General;  Laterality: N/A;  right partial colectomy . POSTERIOR CERVICAL FUSION/FORAMINOTOMY N/A 01/06/2020  Procedure: CERVICAL FOUR- THORACIC TWO LAMINECTOMY AND FUSION;  Surgeon: Consuella Lose, MD;  Location: [place];  Service: Neurosurgery;  Laterality: N/A;  CERVICAL FOUR- THORACIC TWO LAMINECTOMY AND FUSION . RESECTION SMALL BOWEL / CLOSURE ILEOSTOMY  402/2014  Dr [name] . VACUUM ASSISTED CLOSURE CHANGE  02/20/2012  Procedure: ABDOMINAL VACUUM ASSISTED CLOSURE CHANGE;  Surgeon: Odis Hollingshead, MD;  Location: [place];  Service: General;; . VACUUM ASSISTED CLOSURE CHANGE  02/23/2012  Procedure: ABDOMINAL VACUUM ASSISTED CLOSURE CHANGE;  Surgeon: Joyice Faster. Cornett, MD;  Location: [place];  Service: General;  Laterality: N/A; HPI: Brylan Apelles Rumeal Cullipher. is a 69 y.o. male with medical history significant of GERD, hypertension, hyperlipidemia ESRD on hemodialysis, type 2 diabetes mellitus, anemia of chronic disease, colon cancer status post resection, epidural abscess status post laminectomy, posterior cervical fusion on 01/06/2020 and MRSA bacteremia and presented emergently for evaluation of confusion and COVID-19 positive. Initial BSE 02/21/20 was limited- needed enouragement, audible swallow, eructation, poor dentition and reduced endurance, Work of breathing was stable. Dys 2/thin recommended. On 9/22 became unresponsive after HD, transferred to ICU and intubated then extubated 9/25. Currently has NGT, encephalopathy improving per MD note and speech to re-eval. CXR Bilateral basilar infiltrates suggesting multifocal pneumonia.  No data recorded Assessment / Plan / Recommendation CHL IP CLINICAL IMPRESSIONS 03/03/2020 Clinical Impression Pt demonstrates mild orpharyngeal dysphagia mostly related to anatomical differences. His  cervical spine has a khyphotic curvature that widens his pharyngeal area preventing full closure of larynx and adequate pharyngeal contraction. In addition, initiation of swallow is intermittently late with boluses reaching the pyriform sinuses for up to 5 seconds prior to swallow. His epiglottis at times does not fully invert adding to inadequate protection.  Thin barium was penetrated before and during the swallow remaining superior to the vocal cords. His reflexive throat clear, present most of the time did not clear vestibule and a chin tuck was not effective. Penetration was present once with nectar at end of study. Nectar thick recommended for pt to initiate diet and upgrade to thin from clinical observation with SLP. Dys 2 texture recommended, crush pills, small sips/bites, intermittent throat clear and avoid straws.    SLP Visit Diagnosis Dysphagia, pharyngeal phase (R13.13) Attention and concentration deficit following -- Frontal lobe and executive function deficit following -- Impact on safety and function Moderate aspiration risk   CHL IP TREATMENT RECOMMENDATION 03/03/2020 Treatment Recommendations Therapy as outlined in treatment plan below   Prognosis 03/03/2020 Prognosis for Safe Diet Advancement Good Barriers to Reach Goals -- Barriers/Prognosis Comment -- CHL IP DIET RECOMMENDATION 03/03/2020 SLP Diet Recommendations Dysphagia 2 (Fine chop) solids;Nectar thick liquid Liquid Administration via Cup Medication Administration Crushed with puree Compensations Slow rate;Small sips/bites;Clear throat intermittently Postural Changes Seated upright at 90 degrees   CHL IP OTHER RECOMMENDATIONS 03/03/2020 Recommended Consults -- Oral Care Recommendations Oral care BID Other Recommendations Order thickener from pharmacy   CHL IP FOLLOW UP RECOMMENDATIONS 03/03/2020 Follow up Recommendations (No Data)   CHL IP FREQUENCY AND DURATION 03/03/2020 Speech Therapy Frequency (ACUTE ONLY) min 2x/week Treatment Duration 2 weeks       CHL IP ORAL PHASE 03/03/2020 Oral Phase Impaired Oral - Pudding Teaspoon -- Oral - Pudding Cup -- Oral - Honey Teaspoon -- Oral - Honey Cup -- Oral - Nectar Teaspoon -- Oral - Nectar Cup WFL Oral - Nectar Straw -- Oral - Thin Teaspoon -- Oral - Thin Cup Decreased bolus cohesion Oral - Thin Straw -- Oral - Puree -- Oral - Mech Soft -- Oral - Regular Delayed oral transit Oral - Multi-Consistency -- Oral - Pill -- Oral Phase - Comment --  CHL IP PHARYNGEAL PHASE 03/03/2020 Pharyngeal Phase Impaired Pharyngeal- Pudding Teaspoon -- Pharyngeal -- Pharyngeal- Pudding Cup -- Pharyngeal -- Pharyngeal- Honey Teaspoon -- Pharyngeal -- Pharyngeal- Honey Cup -- Pharyngeal -- Pharyngeal- Nectar Teaspoon -- Pharyngeal -- Pharyngeal- Nectar Cup Delayed swallow initiation-pyriform sinuses;Reduced epiglottic inversion;Reduced airway/laryngeal closure;Penetration/Aspiration during swallow;Pharyngeal residue - valleculae Pharyngeal Material enters airway, remains ABOVE vocal cords and not ejected out Pharyngeal- Nectar Straw -- Pharyngeal -- Pharyngeal- Thin Teaspoon -- Pharyngeal -- Pharyngeal- Thin Cup Delayed swallow initiation-pyriform sinuses;Pharyngeal residue - valleculae;Pharyngeal residue - pyriform;Penetration/Aspiration before swallow;Reduced airway/laryngeal closure Pharyngeal Material enters airway, remains ABOVE vocal cords and not ejected out Pharyngeal- Thin Straw -- Pharyngeal -- Pharyngeal- Puree -- Pharyngeal -- Pharyngeal- Mechanical Soft -- Pharyngeal -- Pharyngeal- Regular WFL Pharyngeal -- Pharyngeal- Multi-consistency -- Pharyngeal -- Pharyngeal- Pill -- Pharyngeal -- Pharyngeal Comment --  CHL IP CERVICAL ESOPHAGEAL PHASE 03/03/2020 Cervical Esophageal Phase WFL Pudding Teaspoon -- Pudding Cup -- Honey Teaspoon -- Honey Cup -- Nectar Teaspoon -- Nectar Cup -- Nectar Straw -- Thin Teaspoon -- Thin Cup -- Thin Straw -- Puree -- Mechanical Soft -- Regular -- Multi-consistency -- Pill -- Cervical Esophageal  Comment -- Houston Siren 03/03/2020, 4:35 PM Orbie Pyo Litaker M.Ed Actor Pager [phone] Office [phone]              VAS [place] LOWER EXTREMITY VENOUS (DVT)  Result Date: 02/23/2020  Lower Venous DVTStudy Indications: Elevated D dimer. Other Indications: + Covid. Risk Factors: None identified. Performing Technologist: Griffin Basil RCT RDMS  Examination Guidelines: A complete evaluation includes B-mode imaging, spectral Doppler,  color Doppler, and power Doppler as needed of all accessible portions of each vessel. Bilateral testing is considered an integral part of a complete examination. Limited examinations for reoccurring indications may be performed as noted. The reflux portion of the exam is performed with the patient in reverse Trendelenburg.  +---------+---------------+---------+-----------+----------+--------------+ RIGHT    CompressibilityPhasicitySpontaneityPropertiesThrombus Aging +---------+---------------+---------+-----------+----------+--------------+ CFV      Full           Yes      Yes                                 +---------+---------------+---------+-----------+----------+--------------+ SFJ      Full                                                        +---------+---------------+---------+-----------+----------+--------------+ FV Prox  Full                                                        +---------+---------------+---------+-----------+----------+--------------+ FV Mid   Full                                                        +---------+---------------+---------+-----------+----------+--------------+ FV DistalFull                                                        +---------+---------------+---------+-----------+----------+--------------+ PFV      Full                                                        +---------+---------------+---------+-----------+----------+--------------+ POP       Full           Yes      Yes                                 +---------+---------------+---------+-----------+----------+--------------+ PTV      Full                                                        +---------+---------------+---------+-----------+----------+--------------+ PERO     Full                                                        +---------+---------------+---------+-----------+----------+--------------+   +---------+---------------+---------+-----------+----------+--------------+  LEFT     CompressibilityPhasicitySpontaneityPropertiesThrombus Aging +---------+---------------+---------+-----------+----------+--------------+ CFV      Full           Yes      Yes                                 +---------+---------------+---------+-----------+----------+--------------+ SFJ      Full                                                        +---------+---------------+---------+-----------+----------+--------------+ FV Prox  Full                                                        +---------+---------------+---------+-----------+----------+--------------+ FV Mid   Full                                                        +---------+---------------+---------+-----------+----------+--------------+ FV DistalFull                                                        +---------+---------------+---------+-----------+----------+--------------+ PFV      Full                                                        +---------+---------------+---------+-----------+----------+--------------+ POP      Full           Yes      Yes                                 +---------+---------------+---------+-----------+----------+--------------+ PTV      Full                                                        +---------+---------------+---------+-----------+----------+--------------+ PERO     Full                                                         +---------+---------------+---------+-----------+----------+--------------+     Summary: RIGHT: - There is no evidence of deep vein thrombosis in the lower extremity.  - No cystic structure found in the popliteal fossa.  LEFT: - There is no evidence of deep vein thrombosis in the lower extremity.  - No  cystic structure found in the popliteal fossa.  *See table(s) above for measurements and observations. Electronically signed by Harold Barban MD on 02/23/2020 at 9:31:43 PM.    Final    US Abdomen Limited RUQ  Result Date: 02/23/2020 CLINICAL DATA:  Transaminitis EXAM: ULTRASOUND ABDOMEN LIMITED RIGHT UPPER QUADRANT COMPARISON:  None. FINDINGS: Gallbladder: The gallbladder was not visualized and may be surgically absent Common bile duct: Diameter: 3 mm Liver: The liver parenchyma is coarsened and heterogeneous. The liver surface appears somewhat nodular there is no discrete hepatic mass. There is suggestion of bidirectional flow within the portal vein Other: The right kidney appears atrophic and echogenic. There are multiple right-sided renal cysts measuring up to approximately 3.6 cm. IMPRESSION: 1. Gallbladder not visualized. 2. Coarsened and heterogeneous appearance of the liver parenchyma with findings suspicious for underlying cirrhosis. 3. Borderline bidirectional flow within the main portal vein is suggestive of portal hypertension. 4. Echogenic and atrophic appearing right kidney consistent with the patient's history of renal failure. Electronically Signed   By: Constance Holster M.D.   On: 02/23/2020 19:21

## 2020-03-12 NOTE — Consult Note (Signed)
[facility] Consult   Reason for Consult:  ''Please see the patient face-to-face 03/12/2020, will be off of Covid isolation. Refusing treatment and lab draws, aggressive to the staff''.  Referring Physician:  Lala Lund, MD Patient Identification: [name]. MRN:  [id number] Principal Diagnosis: Acute metabolic encephalopathy Diagnosis:  Principal Problem:   Acute metabolic encephalopathy Active Problems:   Hyperlipidemia   Hyperkalemia   HYPERTENSION   Anemia of chronic disease   Type 2 diabetes mellitus with renal complication (HCC)   ESRD (end stage renal disease) (HCC)   Epidural abscess   COVID-19   Hx of bacteremia   Goals of care, counseling/discussion   Palliative care by specialist   DNR (do not resuscitate) discussion   Total Time spent with patient: 45 minutes  Subjective:   Rodney Apelles Lenward Able. is a 69 y.o. male patient admitted with Covid positive, AMS.  HPI:  69 y.o. male with PMHx of ESRD on HD MWF, DM-2, MRSA bacteremia with epidural abscess s/p C4-T2 laminectomy/fusion on 01/06/2020-on IV vancomycin/rifampin through 10/1-resident of SNF admitted to the hospital due altered mental status and positive for COVID-19. Psychiatric consult was called because patient has been refusing treatment, lab draws and aggressive towards the staff. Today, patient is alert and awake but refused to cooperate with psychiatric evaluation. He is verbally aggressive and says that:''it is an insult that you people think I am crazy, but get out of my room anyway, I have the right to refuse treatment and you cannot force me.''  Past Psychiatric History: patient refused to answer  Risk to Self:   Risk to Others:   Prior Inpatient Therapy:   Prior Outpatient Therapy:    Past Medical History:  Past Medical History:  Diagnosis Date  . Anemia   . Arthritis    left knee  . Chronic kidney disease    acute renal failure/injury requiring short-term HD  2013  . Colon cancer ([place])   . Colon polyp    11/2011 - Polyps identified, biopsy - invasive adenocarinoma  . DM II (diabetes mellitus, type II), controlled ([place])    type 2 IDDM x 15 years. A1C 1/09 13.7.   [facility] Kitchen 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 12/25/2011  . GERD (gastroesophageal reflux disease)   . Heart murmur   . 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 Hyperlipidemia   . Hypertension    for a few years and resolved in 2013/2014.  . Sleep apnea    does not wear CPAP now  . Wears glasses     Past Surgical History:  Procedure Laterality Date  . APPLICATION OF WOUND VAC  02/16/2012   Procedure: APPLICATION OF WOUND VAC;  Surgeon: Zenovia Jarred, MD;  Location: [facility];  Service: General;  Laterality: N/A;  . APPLICATION OF WOUND VAC  02/26/2012   Procedure: APPLICATION OF WOUND VAC;  Surgeon: Imogene Burn. [name], MD;  Location: [facility];  Service: General;  Laterality: N/A;  . AV FISTULA PLACEMENT Left 07/29/2019   Procedure: ARTERIOVENOUS (AV) FISTULA CREATION;  Surgeon: Marty Heck, MD;  Location: [facility];  Service: Vascular;  Laterality: Left;  . COLONOSCOPY    . COLONOSCOPY  02/06/2012   Procedure: COLONOSCOPY;  Surgeon: Milus Banister, MD;  Location: [facility];  Service: Endoscopy;;  . COLONOSCOPY WITH PROPOFOL N/A 06/10/2019   Procedure: COLONOSCOPY WITH PROPOFOL;  Surgeon: Milus Banister, MD;  [facility];  Service: Endoscopy;  Laterality: N/A;  . COLOSTOMY REVERSAL    .  DRESSING CHANGE UNDER ANESTHESIA  02/18/2012   Procedure: DRESSING CHANGE UNDER ANESTHESIA;  Surgeon: Odis Hollingshead, MD;  Location: [facility];  Service: General;  Laterality: N/A;  . ESOPHAGOGASTRODUODENOSCOPY (EGD) WITH PROPOFOL N/A 06/10/2019   Procedure: ESOPHAGOGASTRODUODENOSCOPY (EGD) WITH PROPOFOL;  Surgeon: Milus Banister, MD;  [facility];  Service: Endoscopy;  Laterality: N/A;  . GANGLION CYST EXCISION  20 YRS AGO   RT ARM  . ILEOSTOMY  02/16/2012   Procedure: ILEOSTOMY;  Surgeon:  Zenovia Jarred, MD;  Location: [facility];  Service: General;  Laterality: N/A;  . ILEOSTOMY CLOSURE N/A 09/02/2012   Procedure: ILEOSTOMY REVERSAL;  Surgeon: Imogene Burn. [name], MD;  Location: [facility];  Service: General;  Laterality: N/A;  . ILIOSTOMY    . INCISION AND DRAINAGE OF WOUND  02/23/2012   Procedure: IRRIGATION AND DEBRIDEMENT WOUND;  Surgeon: Joyice Faster. Cornett, MD;  Location: [facility];  Service: General;  Laterality: N/A;  . IR DIALY SHUNT INTRO [place] W/IMG LEFT Left 01/21/2020  . IR FLUORO GUIDE CV LINE RIGHT  01/21/2020  . IR REMOVAL TUN CV CATH W/O FL  01/07/2020  . IR US GUIDE VASC ACCESS LEFT  01/21/2020  . IR US GUIDE VASC ACCESS RIGHT  01/21/2020  . LAPAROTOMY  02/16/2012   Procedure: EXPLORATORY LAPAROTOMY;  Surgeon: Zenovia Jarred, MD;  Location: [facility];  Service: General;  Laterality: N/A;  Exploratory Laparotomy with resection of anastomosis  . LAPAROTOMY  02/18/2012   Procedure: EXPLORATORY LAPAROTOMY;  Surgeon: Odis Hollingshead, MD;  Location: [place] [facility];  Service: General;  Laterality: N/A;  exploratory laparotomy  change of abdominal vac dressing  . LAPAROTOMY  02/20/2012   Procedure: EXPLORATORY LAPAROTOMY;  Surgeon: Odis Hollingshead, MD;  Location: [facility];  Service: General;  Laterality: N/A;  . LAPAROTOMY  02/23/2012   Procedure: EXPLORATORY LAPAROTOMY;  Surgeon: Joyice Faster. Cornett, MD;  Location: [facility];  Service: General;  Laterality: N/A;  Irrigation and Debridement of abdominal wound with wound vac change with partial closure  . LAPAROTOMY  02/26/2012   Procedure: EXPLORATORY LAPAROTOMY;  Surgeon: Imogene Burn. [name], MD;  Location: [facility];  Service: General;  Laterality: N/A;     . PARTIAL COLECTOMY  02/06/2012  . PARTIAL COLECTOMY  02/06/2012   Procedure: PARTIAL COLECTOMY;  Surgeon: Imogene Burn. [name], MD;  Location: [facility];  Service: General;  Laterality: N/A;  right partial colectomy  . POSTERIOR CERVICAL FUSION/FORAMINOTOMY N/A 01/06/2020   Procedure: CERVICAL FOUR-  THORACIC TWO LAMINECTOMY AND FUSION;  Surgeon: Consuella Lose, MD;  Location: [facility];  Service: Neurosurgery;  Laterality: N/A;  CERVICAL FOUR- THORACIC TWO LAMINECTOMY AND FUSION  . RESECTION SMALL BOWEL / CLOSURE ILEOSTOMY  402/2014   Dr [name]  . VACUUM ASSISTED CLOSURE CHANGE  02/20/2012   Procedure: ABDOMINAL VACUUM ASSISTED CLOSURE CHANGE;  Surgeon: Odis Hollingshead, MD;  Location: [facility];  Service: General;;  . VACUUM ASSISTED CLOSURE CHANGE  02/23/2012   Procedure: ABDOMINAL VACUUM ASSISTED CLOSURE CHANGE;  Surgeon: Joyice Faster. Cornett, MD;  Location: [facility];  Service: General;  Laterality: N/A;   Family History:  Family History  Problem Relation Age of Onset  . Diabetes Father   . Heart disease Father   . Hypertension Mother   . Diabetes Sister   . Heart disease Sister   . Colon cancer Neg Hx    Family Psychiatric  History:  Social History:  Social History   Substance and Sexual Activity  Alcohol Use  Yes   Comment: Liquor twice monthly.     Social History   Substance and Sexual Activity  Drug Use Yes  . 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 Marital status: Divorced    Spouse name: Not on file  . Number of children: 3  . Years of education: Not on file  . Highest education level: Not on file  Occupational History  . Occupation: retired/disabled    Employer: [employer]    Comment: Worked at [employer]  . Smoking status: Current Every Day Smoker    Packs/day: 0.50    Years: 30.00    Pack years: 15.00    Types: Cigarettes  . Smokeless tobacco: Never Used  Substance and Sexual Activity  . Alcohol use: Yes    Comment: Liquor twice monthly.  . 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 Sexual activity: Not on file  Other Topics Concern  . Not on file  Social History Narrative   Financial assistance approved for 100% discount at [facility]  and has [facility] card   [employer]  August 08, 2009 5:48 PM   *PATIENT WAS GIVEN DM CARD.Lela Sturdivant NT II  June 08, 2010 3:56 PM      Divorced, one daughter currently staying with him   Has 7 grand children   Reports disability and medicaid is pending   Social Determinants of Health   Financial Resource Strain:   . Difficulty of Paying Living Expenses: Not on file  Food Insecurity:   . Worried About Charity fundraiser in the Last Year: Not on file  . Ran Out of Food in the Last Year: Not on file  Transportation Needs:   . Lack of Transportation (Medical): Not on file  . Lack of Transportation (Non-Medical): Not on file  Physical Activity:   . Days of Exercise per Week: Not on file  . Minutes of Exercise per Session: Not on file  Stress:   . Feeling of Stress : Not on file  Social Connections:   . Frequency of Communication with Friends and Family: Not on file  . Frequency of Social Gatherings with Friends and Family: Not on file  . Attends Religious Services: Not on file  . Active Member of Clubs or Organizations: Not on file  . Attends Archivist Meetings: Not on file  . Marital Status: Not on file   Additional Social History:    Allergies:  No Active Allergies  Labs:  Results for orders placed or performed during the hospital encounter of 02/20/20 (from the past 48 hour(s))  Glucose, capillary     Status: Abnormal   Collection Time: 03/10/20  3:57 PM  Result Value Ref Range   Glucose-Capillary 103 (H) 70 - 99 mg/dL    Comment: Glucose reference range applies only to samples taken after fasting for at least 8 hours.  Glucose, capillary     Status: None   Collection Time: 03/10/20  8:33 PM  Result Value Ref Range   Glucose-Capillary 86 70 - 99 mg/dL    Comment: Glucose reference range applies only to samples taken after fasting for at least 8 hours.   Comment 1 Document in Chart   CBC     Status: Abnormal   Collection Time: 03/11/20  2:33 AM  Result  Value Ref Range   WBC 6.7 4.0 - 10.5 K/uL   RBC 2.84 (L) 4.22 - 5.81 MIL/uL   Hemoglobin 7.8 (L) 13.0 - 17.0 g/dL   HCT 24.0 (L) 39 - 52 %   MCV 84.5 80.0 - 100.0 fL   MCH 27.5 26.0 - 34.0 pg   MCHC 32.5 30.0 - 36.0 g/dL   RDW 19.2 (H) 11.5 - 15.5 %   Platelets 130 (L) 150 - 400 K/uL   nRBC 0.0 0.0 - 0.2 %    Comment: Performed at [facility] [address], [place], [place] [address]  Glucose, capillary     Status: Abnormal   Collection Time: 03/11/20 11:13 AM  Result Value Ref Range   Glucose-Capillary 159 (H) 70 - 99 mg/dL    Comment: Glucose reference range applies only to samples taken after fasting for at least 8 hours.  Glucose, capillary     Status: Abnormal   Collection Time: 03/11/20  8:31 PM  Result Value Ref Range   Glucose-Capillary 125 (H) 70 - 99 mg/dL    Comment: Glucose reference range applies only to samples taken after fasting for at least 8 hours.   Comment 1 Document in Chart   Glucose, capillary     Status: Abnormal   Collection Time: 03/12/20  6:38 AM  Result Value Ref Range   Glucose-Capillary 113 (H) 70 - 99 mg/dL    Comment: Glucose reference range applies only to samples taken after fasting for at least 8 hours.   Comment 1 Document in Chart   Glucose, capillary     Status: Abnormal   Collection Time: 03/12/20 12:05 PM  Result Value Ref Range   Glucose-Capillary 110 (H) 70 - 99 mg/dL    Comment: Glucose reference range applies only to samples taken after fasting for at least 8 hours.    Current Facility-Administered Medications  Medication Dose Route Frequency Provider Last Rate Last Admin  . amLODipine (NORVASC) tablet 10 mg  10 mg Oral Daily Lala Lund K, MD      . calcitRIOL (ROCALTROL) capsule 1.5 mcg  1.5 mcg Oral Q M,W,F-HD Skeet Simmer, [facility]      . chlorhexidine (PERIDEX) 0.12 % solution 15 mL  15 mL Mouth Rinse BID Spero Geralds, MD   15 mL at 03/07/20 0830  . Chlorhexidine Gluconate Cloth 2 % PADS 6 each  6 each Topical  Q0600 Claudia Desanctis, MD   6 each at 03/09/20 0630  . collagenase (SANTYL) ointment   Topical Daily Bonnielee Haff, MD   1 application at 69/79/48 0900  . Darbepoetin Alfa (ARANESP) injection 150 mcg  150 mcg Intravenous Q Wed-HD Claudia Desanctis, MD   150 mcg at 03/08/20 0929  . doxycycline (VIBRA-TABS) tablet 100 mg  100 mg Oral Q12H Bonnielee Haff, MD   100 mg at 03/08/20 2200  . feeding supplement (NEPRO CARB STEADY) liquid 237 mL  237 mL Oral BID BM Bonnielee Haff, MD      . haloperidol lactate (HALDOL) injection 2.5 mg  2.5 mg Intramuscular Q6H PRN Bonnielee Haff, MD      . insulin aspart (novoLOG) injection 0-5 Units  0-5 Units Subcutaneous QHS Bonnielee Haff, MD      . insulin aspart (novoLOG) injection 0-9 Units  0-9 Units Subcutaneous TID WC Bonnielee Haff, MD   2 Units at 03/09/20 1648  . lip balm (CARMEX) ointment   Topical PRN Spero Geralds, MD      .  loperamide (IMODIUM) capsule 4 mg  4 mg Oral TID PRN Bonnielee Haff, MD   2 mg at 03/11/20 1018  . MEDLINE mouth rinse  15 mL Mouth Rinse q12n4p Spero Geralds, MD   15 mL at 03/09/20 1648  . metoprolol tartrate (LOPRESSOR) injection 2.5 mg  2.5 mg Intravenous Q4H PRN Spero Geralds, MD   2.5 mg at 02/29/20 1858  . multivitamin (RENA-VIT) tablet 1 tablet  1 tablet Oral QHS Bonnielee Haff, MD   1 tablet at 03/10/20 2120  . ondansetron (ZOFRAN) injection 4 mg  4 mg Intravenous Q6H PRN Bonnielee Haff, MD      . pantoprazole (PROTONIX) EC tablet 40 mg  40 mg Oral Daily Bonnielee Haff, MD   40 mg at 03/08/20 1017  . QUEtiapine (SEROQUEL) tablet 25 mg  25 mg Oral BID Suella Broad, FNP   25 mg at 03/10/20 2120  . Resource ThickenUp Clear   Oral PRN Bonnielee Haff, MD      . thiamine tablet 100 mg  100 mg Oral Daily Bonnielee Haff, MD   100 mg at 03/08/20 1017  . zinc sulfate capsule 220 mg  220 mg Oral Daily Bonnielee Haff, MD   220 mg at 03/08/20 1017    Musculoskeletal: Strength & Muscle Tone: not tested Gait &  Station: patient refused Patient leans: N/A  Psychiatric Specialty Exam: Physical Exam  Review of Systems  Blood pressure (!) 176/74, pulse (!) 59, temperature 98.4 F (36.9 C), temperature source Oral, resp. rate 18, height 6' (1.829 m), weight 85.3 kg, SpO2 91 %.Body mass index is 25.5 kg/m.  General Appearance: Casual  Eye Contact:  Good  Speech:  Clear and Coherent  Volume:  Increased  Mood:  Angry  Affect:  Labile  Thought Process: unable to determine, patient refused psych evaluation  Orientation:  Other:  unable to determine, patient refused psychiatric assessment.  Thought Content:  unable to determine, patient refused psychiatric assessment  Suicidal Thoughts:  unable to determine, patient refused psychiatric assessment  Homicidal Thoughts:  unable to determine, patient refused psychiatric assessment  Memory:  unable to determine, patient refused psychiatric assessment  Judgement:  Poor  Insight:  Lacking  Psychomotor Activity:  Increased  Concentration:  Concentration: Poor  Recall:  Poor  Fund of Knowledge:  unable to determine, patient refused psychiatric assessment  Language:  Good  Akathisia:  No  Handed:  Right  AIMS (if indicated):     Assets:  Social Support  ADL's:  unable to determine, patient refused psychiatric assessment  Cognition:  WNL  Sleep:        Treatment Plan Summary: 69 year old male who was admitted for altered mental status and positive Covid-19. However, he has been refusing treatment and getting aggressive with staff. He refused a full psychiatric assessment and and as such unable to determine his capacity to make an inform medical decision. Case discussed with the treating physician -Dr. Candiss Norse  Recommendations: -Consider discussing Guardianship application with patient's daughter so that the family can take over patient's medical decision ability. -Consider increasing Seroquel to 50 mg bid for aggression -Continue Haloperidol 2.5 mg PO  Q6hrs as needed for agitation or IM if patient refused PO  Disposition: No evidence of imminent risk to self or others at present.   Patient does not meet criteria for psychiatric inpatient admission. Re-consult psychiatric service as needed   Corena Pilgrim, MD 03/12/2020 1:19 PM

## 2020-03-13 DIAGNOSIS — G9341 Metabolic encephalopathy: Secondary | ICD-10-CM | POA: Diagnosis not present

## 2020-03-13 LAB — GLUCOSE, CAPILLARY
Glucose-Capillary: 87 mg/dL (ref 70–99)
Glucose-Capillary: 142 mg/dL — ABNORMAL HIGH (ref 70–99)
Glucose-Capillary: 138 mg/dL — ABNORMAL HIGH (ref 70–99)

## 2020-03-13 NOTE — Progress Notes (Signed)
Patient assisted to the bedside commode. Skin care with lotion given per pt request due to dryness and itching. Pt Did not toilet at this time. Returned patient to Bed. Placed more zinc cream to bottom. Pt refuses protective foam bandages for the third time. Pt became argumentative pushed bedside table over. Unable to reason with patient. Patient not receptive to  calming measures at this time.   Haldol prn given see MAR.

## 2020-03-13 NOTE — Progress Notes (Signed)
The patient refuses to have his blood fingerstick glucose checked.

## 2020-03-13 NOTE — Progress Notes (Signed)
The patient is pleasantly refusing medications when offered to give them to him

## 2020-03-13 NOTE — Progress Notes (Signed)
RN was able to give some of patient's meds last night, allowed nursing staff to check his vitals signs and blood sugars. However refused his insulin and put dressing on his sacrum. Pt continues to have loose bowel movements and gets irritated when cleaned. Pt had instances to sit on the side of the bed but do not attempt to get out of the bed. Nursing staff was able to ask patient to lay back down and call staff when he needs assistance. Pt on low bed and fall mats.

## 2020-03-13 NOTE — Progress Notes (Signed)
The patient continues to refuse treatment- patient will not take his prescribed medications, refuses blood glucose sticks, refuses lab sticks.  The patient has been educated the importance of continued care or else, he could be causing himself more harm.  The patient is pleasant and appears to be oriented to be oriented to person, place and time.  He reports that he understands what he is doing.  Dr Candiss Norse has been made aware of the situation.

## 2020-03-13 NOTE — Progress Notes (Addendum)
Pt refuses to have B/P taken during evening vital signs evaluation and check. Patient educated to the importance of having vital signs checked and rationale for frequency.  Pt with continued confusion and repeated attempts to exit the bed. Sometimes he calls out "Nurse Nurse" and sometimes he uses the call light.  Patient continued to refuse stating "I don't need them right now". Helped to assist patient to the bedside commode. Personal care given. Patient refuses to place protective foam bandages to his backside. Assisted patient back to the bed, Low bed alarm exit maintained. Phone and call bell placed in reach.

## 2020-03-13 NOTE — Progress Notes (Addendum)
Patient refuses all night time medication. All medications scheduled for tonight, was verbalized and discussed the importance of with the patient. Patient continues to refuse meds.

## 2020-03-13 NOTE — Plan of Care (Signed)
  Problem: Safety: Goal: Ability to remain free from injury will improve Outcome: Progressing   Problem: Skin Integrity: Goal: Risk for impaired skin integrity will decrease Outcome: Progressing   Problem: Education: Goal: Knowledge of risk factors and measures for prevention of condition will improve Outcome: Progressing   Problem: Coping: Goal: Psychosocial and spiritual needs will be supported Outcome: Progressing   Problem: Respiratory: Goal: Will maintain a patent airway Outcome: Progressing Goal: Complications related to the disease process, condition or treatment will be avoided or minimized Outcome: Progressing   Problem: Education: Goal: Knowledge of disease and its progression will improve Outcome: Progressing Goal: Individualized Educational Video(s) Outcome: Progressing   Problem: Fluid Volume: Goal: Compliance with measures to maintain balanced fluid volume will improve Outcome: Progressing   Problem: Health Behavior/Discharge Planning: Goal: Ability to manage health-related needs will improve Outcome: Progressing   Problem: Nutritional: Goal: Ability to make healthy dietary choices will improve Outcome: Progressing   Problem: Clinical Measurements: Goal: Complications related to the disease process, condition or treatment will be avoided or minimized Outcome: Progressing

## 2020-03-13 NOTE — Plan of Care (Signed)
  Problem: Education: Goal: Knowledge of risk factors and measures for prevention of condition will improve Outcome: Progressing   Problem: Respiratory: Goal: Will maintain a patent airway Outcome: Progressing Goal: Complications related to the disease process, condition or treatment will be avoided or minimized Outcome: Progressing   

## 2020-03-13 NOTE — Consult Note (Signed)
[place] Nurse wound follow up Patient receiving care in [facility] 5N31. Wound type: Per the [place] note from 03/06/20 the sacral and bilateral buttocks wounds were unstageable.  In the intervening days the patient has refused many attempts at care activities.  Today was no different.  The patient refused to allow me to evaluate the wounds.  Please see Dr. Keturah Barre note from this morning at [phone] where the patient's competence and ability to refuse care has been well documented. I am signing the [place] service off of follow up for this patient. Please reconsult our team if the patient becomes agreeable to our speciality service, or unable to refuse care. Val Riles, RN, MSN, CWOCN, CNS-BC, pager [phone]

## 2020-03-13 NOTE — Progress Notes (Signed)
[facility] Progress Note  Subjective:  Patient seen and examined at bedside. Alert, answers questions. Denies cp, sob. For dialysis today.     Vitals:   03/12/20 1923 03/12/20 1951 03/13/20 0500 03/13/20 0700  BP:  (!) 168/70 (!) 154/59 (!) 160/64  Pulse: 88 79 82 79  Resp: 18 17 19 18   Temp: 98.4 F (36.9 C) 98.9 F (37.2 C) 99.6 F (37.6 C) 98 F (36.7 C)  TempSrc: Oral Oral Oral Oral  SpO2: 97% 100% 100% 99%  Weight:      Height:        Exam:  alert, nad   no jvd  Chest cta bilat  Cor reg no RG  Abd soft ntnd no ascites   Ext no LE edema   Alert, NF, ox3  +TDC/ L AVF+bruit     CXR 9/23 - patchy perihilar/ basilar dz, no edema      OP HD: [facility] MWF   4h 29min  87kg  2/2 bath  TDC / L AVF  Hep none  -Mircera 150 mcg IV q 2 weeks (last dose 02/09/2020) -Venofer 100 mg IV X 8 doses (not started yet) -Calcitriol 1.5 mcg PO TIW  Assessment/ Plan: 1. COVID PNA/ HCAP (EColi/ MSSA resp culture): Tested positive at [facility] 09/18 and again here. Got IV cefazolin thru 10/1. Remdesivir stopped 9/22 d/t ^LFT's.  COVID isolation ends  03/12/20.  2. Acute hypoxic resp failure.  At one point required mechanical ventilation. Resolved.  3. AMS - mentation seems much better. Pt continues to refuse some treatments however. Seen by psychiatry 10/10 and refused to discuss anything with them. Psychiatry suggesting guardianship. Per pmd.  4. Recent epidural abscess/MRSA cervical discitis-  S/p decompression surgery C4-T2 on 01/06/20 during prior admission. Pt admitted this time on 9/19 w/ AMS.  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  NSurg recommended no further surgery. Plans w/ ID were for Vancomycin and Rifampin here through 10/01 but rifampin dc'd early due to ^LFT"s. PO doxy started on 10/01 and pt is now off IV vanc. Will f/u w/ ID clinic in 3-4 wks, they will guide on the stop date of the po doxy. 5.  ESRD - MWF HD. HIT testing was negative. OK to  use heparin to block catheter now. HD Monday.  6.  HD access - L BC AVF placed Feb 2021. Was used starting in May and had infiltration in Aug prompting [facility] placement to rest the AVF. Have used AVF w/o issues on last 3 HD sessions. TDC would be ready for removal, but due to lack of good IV access have kept in for now.  7.  Hypertension/volume - Previously on high dose midodrine here and this is now off.  Keep even on HD, has not tolerated UF here lately.  8. Anemia  - aranesp increased to 150 mcg for 9/29 onward for now. Hold OP Fe load.  s/p PRBC's 9/29 and may need again soon  9.  Metabolic bone disease - off renvela due to hypophosphatemia and improved last check.  Anticipate need to resume at a lower dose per trends. on calcitriol - corrected Ca ok 10.  DM-per primary 11.  Low plts - 2nd level testing for HIT was negative.  59   H/o HCV/ liver cirrhosis - hep C testing here was negative. Per pmd will need GI f/u in OP setting.  13.   Loose stools/ sacral wound - flexiseal in place now  14.  Dispo - awaiting SNF placement  Lynnda Child PA-C [facility] 03/13/2020,10:38 AM   Recent Labs  Lab 03/08/20 0808 03/09/20 1031 03/10/20 1109 03/10/20 1109 03/11/20 0233 03/12/20 1747  K 3.8  --  4.2  --   --   --   BUN 40*  --  35*  --   --   --   CREATININE 8.04*  --  8.71*  --   --   --   CALCIUM 7.7*  --  7.5*  --   --   --   PHOS 5.1*  --  4.9*  --   --   --   HGB 5.9*   < > 7.9*   < > 7.8* 8.4*   < > = values in this interval not displayed.   Inpatient medications: . amLODipine  10 mg Oral Daily  . calcitRIOL  1.5 mcg Oral Q M,W,F-HD  . chlorhexidine  15 mL Mouth Rinse BID  . Chlorhexidine Gluconate Cloth  6 each Topical Q0600  . collagenase   Topical Daily  . darbepoetin (ARANESP) injection - DIALYSIS  150 mcg Intravenous Q Wed-HD  . doxycycline  100 mg Oral Q12H  . feeding supplement (NEPRO CARB STEADY)  237 mL Oral BID BM  . insulin aspart  0-5 Units  Subcutaneous QHS  . insulin aspart  0-9 Units Subcutaneous TID WC  . mouth rinse  15 mL Mouth Rinse q12n4p  . multivitamin  1 tablet Oral QHS  . pantoprazole  40 mg Oral Daily  . QUEtiapine  50 mg Oral BID  . thiamine  100 mg Oral Daily  . zinc sulfate  220 mg Oral Daily    haloperidol lactate, lip balm, loperamide, metoprolol tartrate, [DISCONTINUED] ondansetron **OR** ondansetron (ZOFRAN) IV, Resource ThickenUp Clear

## 2020-03-13 NOTE — Progress Notes (Signed)
The nurse contacted [name], his daughter and made her aware that he was refusing treatment.  She asked to talk with her father about refusing dialysis.  The patient continues to refuse treatment.

## 2020-03-13 NOTE — Progress Notes (Addendum)
PROGRESS NOTE                                                                                                                                                                                                             Patient Demographics:    [name], is a 69 y.o. male, DOB - December 09, 1950, OZH:[id number]  Outpatient Primary MD for the patient is [facility], [facility]   Admit date - 02/20/2020   LOS - 42  Chief Complaint  Patient presents with  . Covid Positive  . AMS       Brief Narrative: Patient is a 69 y.o. male with PMHx of ESRD on HD MWF, DM-2, MRSA bacteremia with epidural abscess s/p C4-T2 laminectomy/fusion on 01/06/2020-on IV vancomycin/rifampin through 10/1-resident of SNF-apparently tested positive for COVID-19 on 9/18-subsequently brought to the ED on 9/19 for worsening confusion (more than baseline)-fever-thought to have acute metabolic encephalopathy from COVID-19 infection and admitted to the hospitalist service.  Further hospital course complicated by development of hypotension/severe encephalopathy requiring intubation and ICU transfer.  See below for further details.  COVID-19 vaccinated status: Unvaccinated  Significant Events: 8/3-8/23>> hospitalized for epidural abscess/MRSA bacteremia-underwent laminectomy-briefly intubated due to altered mental status.  Discharged to SNF 9/18>> COVID-19 positive at SNF 9/19>> Admit to [facility] for fever/confusion 9/22>> obtunded/hypotensive after coming back from hemodialysis-emergently intubated/transferred to ICU 9/30>> transferred back to [facility] 9/30>> SQ heparin stopped-pharmacy consulted for HIT protocol.  Significant studies: 8/6>> Echo: EF> 75% 9/19>>Chest x-ray: No acute abnormality in the lung. 9/19>> CT head: No acute intracranial abnormality 9/19>> CT C-spine: Postoperative findings of posterior laminectomy/fusion C4-T1, prevertebral soft tissue edema  appears reduced, postoperative fluid collection overlies laminectomy site does not appear significantly changed compared to prior examination. 9/20>> MRI C-spine: Attempted but due to AMS-not completed 9/21>> MRI C Spine: Osteomyelitis of skull base, anterior C1 and odontoid, C2-C3 facet joint-associated erosion of bone since 8/27-resolved epidural abscess 9/22>> coarsened/heterogeneous appearance of the liver parenchyma-suspicious for cirrhosis, gallbladder not visualized. 9/22>> bilateral lower extremity Doppler: No DVT 9/29>> chest x-ray: Bibasilar infiltrates suggesting multifocal pneumonia  COVID-19 medications: Remdesivir: 9/19>> 9/21  Antibiotics: IV vancomycin plan to continue through October 1 (rifampin stopped 9/21 due to elevated LFTs) Ancef: 9/26>> Zosyn: 9/24>> 9/26  Microbiology data: 9/20 >>blood culture: No growth so far 9/21>> blood culture: No growth so far 9/24>> sputum  culture: E. coli/MSSA  Procedures: ET tube 9/22 > 9/25  Consults: Nephrology PCCM GI  DVT prophylaxis: heparin injection 5,000 Units Start: 02/20/20>> stopped on 9/30 due to worsening thrombocytopenia. SCDs Start: 02/20/20 1628   Subjective:   Patient in bed, appears comfortable, denies any headache, no fever, no chest pain or pressure, no shortness of breath , no abdominal pain. No focal weakness.  Refusing dialysis today.  Saw the patient again second time on 03/13/2020 at 12 PM, again remained calm, understands the consequences of his actions but continues to refuse treatment.   Assessment  & Plan :   Acute hypoxic respiratory failure secondary to HCAP (E. coli/MSSA in sputum culture) and COVID-19 pneumonia Initially admitted and thought to have mild disease. Started on steroid/Remdesivir. But decompensated on 9/22 requiring intubation and ICU transfer.  Extubated 9/26.  Found to have MSSA in the sputum culture.  Given intravenous cefazolin till 10/1. Remdesivir stopped on 9/22 due to  significantly elevated LFTs.    Respiratory status remained stable.  He is saturating normal on room air. Tested positive on 9/19.  He will end COVID-19 isolation on 10/10.  Fall - Patient sustained a fall few days ago .  No injuries were noted.  CT scan of the head and neck unremarkable for any acute process.  He does have excoriation in the posterior right scalp.  No active bleeding noted.  Acute metabolic encephalopathy -  Multifactorial etiology-on initial presentation to the hospital was felt to be due to uremia (due to under dialysis, patient got short outpatient HD numerous times), sepsis physiology from COVID-19.  Really required Precedex in the in ICU, since then he has been placed on scheduled Seroquel and as needed Haldol with good improvement.  Mentation actually is quite improved.  He still gets extremely agitated however his he is oriented x2, no focal deficits, no headaches, as of now he certainly seems to have the capacity of making decisions although they might not be the best choices for his health care.  He refused to see the psychiatrist.  Also reviewed his discharge summary note from 1 year ago where he left AMA from the hospital.    Has Capacity - have explained to the daughter that his mentation actually has improved in terms of encephalopathy and now he clearly understands what he is doing and the consequences of doing so, this was explained to her in detail on 03/13/2020.     - Patient does appear to have the capacity to make an informed decision to agree to or refuse treatment, clearly understands that refusing medications and lab draws can result to disability or death, says I am no baby and I clearly understand what I am doing and know the consequences, he certainly realizes that he is sick and he is at [facility] and that he recently recovered from COVID-19 infection, he realizes that he had neck surgery and that his neck problems are much improved.   - Patient  is able to express a choice of either - to accept or refuse treatment plan or medications (does not frequently change decisions).    - Patient does not lack an understanding of the nature of the medication or procedures and the associated risks, benefits, and/or alternatives.      - Patient does not lack an appreciation of the nature of his/her current medical condition.   - Patient is able to rationally manipulate information to make an informed decision.      Dysphagia -  He was getting tube feedings however the tube came out on 10/3.  He was started on a dysphagia diet.  Seems to have improved.  Tolerating diet better than before. Speech therapy following.  Elevated D-dimer - Secondary to COVID-19.  He did not have any significant hypoxia.  Lower extremity Dopplers were negative.  Since he has clinically improved will not pursue any further work-up.  D-dimer was 6.16 on October 1.  Clinically has improved.    History of MRSA bacteremia with epidural abscess requiring C4-T2 laminectomy ( 01/06/2020 by Dr Kathyrn Sheriff with residual osteomyelitis of the skull base :  MRI C-spine completed on 9/21. Appears to have residual osteomyelitis of the skull base area. Was discussed with neurosurgeon Dr. Kathyrn Sheriff on 9/21. No further surgical intervention required.  Plans were to continue vancomycin/rifampin through October 1-however rifampin discontinued due to significantly elevated LFTs.    Patient now off of vancomycin.  Started on doxycycline from 10/1, plan per primary critical care and ID pharmacist is to follow with ID clinic in 3 to 4 weeks and then they will guide on the stop date.  Patient will need follow-up with infectious disease clinic post discharge.  Will also need follow-up with neurosurgery.  Continues to have staples in his neck area, neurosurgery requested to evaluate on 03/12/2020 & again on 03/13/20, most likely staples could come out.     Septic shock, resolved - Required pressors  while in the ICU. Thought to be most likely secondary to HCAP although patient has a history of recent MRSA bacteremia/cervical spine osteomyelitis/abscess.   Thrombocytopenia/HIT has been ruled out - Suspect this is multifactorial in etiology from acute , illness/sepsis/thrombocytopenia-potentially some contribution from underlying liver cirrhosis-HIT ruled out and platelet counts have stabilized.  Normocytic Anemia - Secondary to ESRD. Worsened by acute illness-s/p 3 unit of PRBC infusions this admission last transfusion on 03/08/2020.  Currently stable.  He did have some bleeding around her dialysis site which is now stable. Hemoglobin noted to be 5.9 on 10/6  Shock liver - LFTs have normalized.    History of HCV/liver cirrhosis - HCV antibody positive in the past.  Viral load was done and no hepatitis C quantitative was detected.  Liver ultrasound with suggestion of cirrhotic appearance.  Will need outpatient GI follow-up.  ESRD on HD MWF - Nephrology following.  Patient being dialyzed as per his usual schedule.  Patient has been curtailing his dialysis treatment.  Has not been cooperative.  Sacral wound/loose stools - Wound care is following for sacral wounds.  Currently has a Flexi-Seal.  Mainly present to protect the wounds that he has in the lower back area.  Imodium as needed.     Metabolic acidosis - Likely secondary to incomplete HD treatments. Resolved with regular HD sessions.  Hyperkalemia - Likely secondary to incomplete dialysis treatments, resolved with hemodialysis  Essential hypertension - added Norvasc and monitor blood pressures have stabilized.  Hyperlipidemia - Statin on hold-should be able to resume when closer to discharge.  Questionable dementia - Discussed with patient's daughter.  She is not certain if patient truly has cognitive impairment or dementia.  Psychiatry consulted.  Deconditioning/debility: Secondary to acute illness on top of chronic debility.  PT eval  completed-SNF on discharge.  DM-2 (A1c 6.5 on 8/11):   Appears to have brittle diabetes resulting in hypoglycemic episodes while in the ICU.  CBGs noted to be borderline low.  Discontinued Levemir to avoid hypoglycemia.    CBG (last 3)  Recent Labs  03/12/20 1205 03/12/20 2121 03/13/20 0705  GLUCAP 110* 159* 87     GI prophylaxis: PPI  Condition - Stable  Family Communication  :  Daughter - Joseph Art [phone]) on 03/12/2020, 03/13/20  Code Status :  Full Code  Diet :  Diet Order            DIET DYS 3 Room service appropriate? No; Fluid consistency: Thin  Diet effective now                  Disposition Plan  : Plan is for the patient to go to skilled nursing facility when medically improved.  His refusal of medical treatment has been a major challenge.  Status is: Inpatient  Remains inpatient appropriate because:Inpatient level of care appropriate due to severity of illness  Dispo: The patient is from: SNF              Anticipated d/c is to: SNF              Anticipated d/c date is: Hopefully by the end of this week              Patient currently is not medically stable to d/c.    Antimicorbials  :    Anti-infectives (From admission, onward)   Start     Dose/Rate Route Frequency Ordered Stop   03/06/20 2000  doxycycline (VIBRA-TABS) tablet 100 mg        100 mg Oral Every 12 hours 03/06/20 1632     03/04/20 1000  doxycycline (VIBRA-TABS) tablet 100 mg  Status:  Discontinued        100 mg Oral Every 12 hours 03/01/20 0944 03/04/20 0741   03/04/20 1000  doxycycline (VIBRA-TABS) tablet 100 mg  Status:  Discontinued        100 mg Per Tube Every 12 hours 03/04/20 0741 03/06/20 1632   02/27/20 1500  ceFAZolin (ANCEF) IVPB 1 g/50 mL premix        1 g 100 mL/hr over 30 Minutes Intravenous Every 24 hours 02/27/20 1433 03/02/20 1445   02/25/20 1000  piperacillin-tazobactam (ZOSYN) IVPB 2.25 g  Status:  Discontinued        2.25 g 100 mL/hr over 30 Minutes Intravenous  Every 8 hours 02/25/20 0905 02/27/20 1433   02/25/20 0830  piperacillin-tazo (ZOSYN) NICU IV syringe 225 mg/mL  Status:  Discontinued        100 mg/kg of piperacillin  84.3 kg 84.4 mL/hr over 30 Minutes Intravenous Every 8 hours 02/25/20 0825 02/25/20 0905   02/25/20 0814  vancomycin (VANCOCIN) 1-5 GM/200ML-% IVPB       Note to Pharmacy: Cherylann Banas   : cabinet override      02/25/20 0814 02/25/20 1158   02/23/20 0642  vancomycin (VANCOCIN) 1-5 GM/200ML-% IVPB       Note to Pharmacy: [place], [place] : cabinet override      02/23/20 0642 02/23/20 1859   02/21/20 1200  vancomycin (VANCOCIN) IVPB 1000 mg/200 mL premix        1,000 mg 200 mL/hr over 60 Minutes Intravenous Every M-W-F (Hemodialysis) 02/20/20 1959 03/03/20 1130   02/21/20 1000  remdesivir 100 mg in sodium chloride 0.9 % 100 mL IVPB  Status:  Discontinued       "Followed by" Linked Group Details   100 mg 200 mL/hr over 30 Minutes Intravenous Daily 02/20/20 1632 02/23/20 0633   02/20/20 2000  rifampin (RIFADIN) capsule 300 mg  Status:  Discontinued  300 mg Oral Every 12 hours 02/20/20 1907 02/23/20 6659   02/20/20 1730  remdesivir 200 mg in sodium chloride 0.9% 250 mL IVPB       "Followed by" Linked Group Details   200 mg 580 mL/hr over 30 Minutes Intravenous Once 02/20/20 1632 02/20/20 1913      Inpatient Medications  Scheduled Meds: . amLODipine  10 mg Oral Daily  . calcitRIOL  1.5 mcg Oral Q M,W,F-HD  . chlorhexidine  15 mL Mouth Rinse BID  . Chlorhexidine Gluconate Cloth  6 each Topical Q0600  . collagenase   Topical Daily  . darbepoetin (ARANESP) injection - DIALYSIS  150 mcg Intravenous Q Wed-HD  . doxycycline  100 mg Oral Q12H  . feeding supplement (NEPRO CARB STEADY)  237 mL Oral BID BM  . insulin aspart  0-5 Units Subcutaneous QHS  . insulin aspart  0-9 Units Subcutaneous TID WC  . mouth rinse  15 mL Mouth Rinse q12n4p  . multivitamin  1 tablet Oral QHS  . pantoprazole  40 mg Oral Daily  .  QUEtiapine  50 mg Oral BID  . thiamine  100 mg Oral Daily  . zinc sulfate  220 mg Oral Daily   Continuous Infusions:  PRN Meds:.haloperidol lactate, lip balm, loperamide, metoprolol tartrate, [DISCONTINUED] ondansetron **OR** ondansetron (ZOFRAN) IV, Resource ThickenUp Clear    Lala Lund M.D on 03/13/2020 at 9:42 AM  To page go to [email or web address] - use universal password  [facility] -  Office  [phone]    Objective:   Vitals:   03/12/20 1923 03/12/20 1951 03/13/20 0500 03/13/20 0700  BP:  (!) 168/70 (!) 154/59 (!) 160/64  Pulse: 88 79 82 79  Resp: 18 17 19 18   Temp: 98.4 F (36.9 C) 98.9 F (37.2 C) 99.6 F (37.6 C) 98 F (36.7 C)  TempSrc: Oral Oral Oral Oral  SpO2: 97% 100% 100% 99%  Weight:      Height:        Wt Readings from Last 3 Encounters:  03/11/20 85.3 kg  01/28/20 86.2 kg  01/24/20 89.4 kg     Intake/Output Summary (Last 24 hours) at 03/13/2020 0942 Last data filed at 03/12/2020 1300 Gross per 24 hour  Intake 120 ml  Output --  Net 120 ml     Physical Exam  Awake Alert, No new F.N deficits, C spine staples in place site appears clean [place].AT,PERRAL Supple Neck,No JVD, No cervical lymphadenopathy appriciated.  Symmetrical Chest wall movement, Good air movement bilaterally, CTAB RRR,No Gallops, Rubs or new Murmurs, No Parasternal Heave +ve B.Sounds, Abd Soft, No tenderness, No organomegaly appriciated, No rebound - guarding or rigidity. No Cyanosis, Clubbing or edema, No new Rash or bruise     Data Review:    CBC Recent Labs  Lab 03/08/20 0808 03/09/20 1031 03/10/20 1109 03/11/20 0233 03/12/20 1747  WBC 7.8 11.5* 7.1 6.7 6.6  HGB 5.9* 8.8* 7.9* 7.8* 8.4*  HCT 18.4* 26.7* 24.9* 24.0* 25.9*  PLT 127* 150 139* 130* 154  MCV 84.0 84.8 85.6 84.5 86.0  MCH 26.9 27.9 27.1 27.5 27.9  MCHC 32.1 33.0 31.7 32.5 32.4  RDW 22.4* 19.7* 19.6* 19.2* 19.2*    Chemistries  Recent Labs  Lab 03/08/20 0808 03/10/20 1109  NA  139 138  K 3.8 4.2  CL 103 103  CO2 23 25  GLUCOSE 83 121*  BUN 40* 35*  CREATININE 8.04* 8.71*  CALCIUM 7.7* 7.5*    Radiology Reports CT HEAD WO  CONTRAST  Result Date: 03/09/2020 CLINICAL DATA:  Status post fall. EXAM: CT HEAD WITHOUT CONTRAST CT CERVICAL SPINE WITHOUT CONTRAST TECHNIQUE: Multidetector CT imaging of the head and cervical spine was performed following the standard protocol without intravenous contrast. Multiplanar CT image reconstructions of the cervical spine were also generated. COMPARISON:  February 20, 2020. FINDINGS: CT HEAD FINDINGS Brain: Mild chronic ischemic white matter disease. No mass effect or midline shift is noted. Ventricular size is within normal limits. There is no evidence of mass lesion, hemorrhage or acute infarction. Vascular: No hyperdense vessel or unexpected calcification. Skull: Normal. Negative for fracture or focal lesion. Sinuses/Orbits: No acute finding. Other: None. CT CERVICAL SPINE FINDINGS Alignment: Normal. Skull base and vertebrae: Stable erosive changes are seen involving the dens and anterior arch of C1 most consistent with degenerative change. Status post laminectomy extending from C4 to T1. Status post surgical posterior fusion extending from C4 to T1 with bilateral intrapedicular screw placement at all levels except C7. Soft tissues and spinal canal: No prevertebral fluid or swelling. No visible canal hematoma. Disc levels: Severe degenerative disc disease is noted at C6-7 and C7-T1. Upper chest: Some degree of pleural effusion is seen involving the right lung apex. Other: None. IMPRESSION: 1. Mild chronic ischemic white matter disease. No acute intracranial abnormality seen. 2. Postsurgical and degenerative changes as described above. No acute abnormality seen in the cervical spine. Electronically Signed   By: Marijo Conception M.D.   On: 03/09/2020 13:14   CT Head Wo Contrast  Result Date: 02/20/2020 CLINICAL DATA:  Delirium.  COVID-19  positive.  Recent neck surgery. EXAM: CT HEAD WITHOUT CONTRAST TECHNIQUE: Contiguous axial images were obtained from the base of the skull through the vertex without intravenous contrast. COMPARISON:  January 28, 2020 FINDINGS: Brain: No subdural, epidural, or subarachnoid hemorrhage. A lacunar infarct in the right thalamus is stable and nonacute. Moderate white matter changes again identified. No acute cortical ischemia or acute infarct noted. No mass effect or midline shift. Ventricles and sulci are unchanged. Cerebellum, brainstem, and basal cisterns are normal. Vascular: Calcified atherosclerosis in the intracranial carotids. Skull: Normal. Negative for fracture or focal lesion. Sinuses/Orbits: Mild scattered mucosal thickening in the ethmoid air cells and left maxillary sinus. No air-fluid levels. Mastoid air cells and middle ears are well aerated. Other: None. IMPRESSION: 1. No acute intracranial abnormalities. Chronic white matter changes. Chronic right thalamic lacunar infarct. 2. Mild sinus disease as above. Electronically Signed   By: Dorise Bullion III M.D   On: 02/20/2020 14:49   CT CERVICAL SPINE WO CONTRAST  Result Date: 03/09/2020 CLINICAL DATA:  Status post fall. EXAM: CT HEAD WITHOUT CONTRAST CT CERVICAL SPINE WITHOUT CONTRAST TECHNIQUE: Multidetector CT imaging of the head and cervical spine was performed following the standard protocol without intravenous contrast. Multiplanar CT image reconstructions of the cervical spine were also generated. COMPARISON:  February 20, 2020. FINDINGS: CT HEAD FINDINGS Brain: Mild chronic ischemic white matter disease. No mass effect or midline shift is noted. Ventricular size is within normal limits. There is no evidence of mass lesion, hemorrhage or acute infarction. Vascular: No hyperdense vessel or unexpected calcification. Skull: Normal. Negative for fracture or focal lesion. Sinuses/Orbits: No acute finding. Other: None. CT CERVICAL SPINE FINDINGS  Alignment: Normal. Skull base and vertebrae: Stable erosive changes are seen involving the dens and anterior arch of C1 most consistent with degenerative change. Status post laminectomy extending from C4 to T1. Status post surgical posterior fusion extending from C4 to  T1 with bilateral intrapedicular screw placement at all levels except C7. Soft tissues and spinal canal: No prevertebral fluid or swelling. No visible canal hematoma. Disc levels: Severe degenerative disc disease is noted at C6-7 and C7-T1. Upper chest: Some degree of pleural effusion is seen involving the right lung apex. Other: None. IMPRESSION: 1. Mild chronic ischemic white matter disease. No acute intracranial abnormality seen. 2. Postsurgical and degenerative changes as described above. No acute abnormality seen in the cervical spine. Electronically Signed   By: Marijo Conception M.D.   On: 03/09/2020 13:14   CT Cervical Spine Wo Contrast  Result Date: 02/20/2020 CLINICAL DATA:  COVID positive, recent neck surgery EXAM: CT CERVICAL SPINE WITHOUT CONTRAST TECHNIQUE: Multidetector CT imaging of the cervical spine was performed without intravenous contrast. Multiplanar CT image reconstructions were also generated. COMPARISON:  CT cervical spine, 01/28/2020 FINDINGS: Alignment: Normal. Skull base and vertebrae: No acute fracture. No primary bone lesion. Erosive change of the dens and adjacent transverse process of C1 are not significantly changed compared to prior examination. Soft tissues and spinal canal: Assessment of the soft tissues is somewhat limited by metallic streak artifact and lack of intravenous contrast; within this limitation, no unexpected fluid collection overlying the laminectomy site. Prevertebral soft tissue edema appears reduced compared to prior examination. No visible canal hematoma. Disc levels: Status post posterior laminectomy and fusion of C4 through T1, with moderate disc space height loss and osteophytosis of C6-C7 and  C7-T1. Upper chest: Negative. Other: None. IMPRESSION: 1. Redemonstrated postoperative findings status post posterior laminectomy and fusion of C4 through T1, with moderate disc space height loss and osteophytosis of C6-C7 and C7-T1. 2. Prevertebral soft tissue edema appears reduced compared to prior examination. 3. Postoperative fluid collection overlying laminectomy site does not appear significantly changed compared to prior examination. 4. Assessment of the soft tissues and epidural space is generally limited on noncontrast CT and additionally limited by metallic streak artifact; contrast enhanced MRI is the test of choice for evaluation of edema, fluid collections, the epidural space, and discitis osteomyelitis if clinically suspected in the postoperative setting. Electronically Signed   By: Eddie Candle M.D.   On: 02/20/2020 14:53   MR CERVICAL SPINE WO CONTRAST  Result Date: 02/22/2020 CLINICAL DATA:  69 year old male with history of dorsal cervical epidural abscess, status post operative drainage and decompression last month on 01/06/2020. Follow-up MRI 01/29/2020 with new left occipital condyle, anterior C1 and odontoid marrow edema and erosion compatible with skull base osteomyelitis. End stage renal disease on dialysis. Confusion. Positive COVID-19. EXAM: MRI CERVICAL SPINE WITHOUT CONTRAST TECHNIQUE: Multiplanar, multisequence MR imaging of the cervical spine was performed. No intravenous contrast was administered. COMPARISON:  Recent CT head and cervical spine 02/20/2020. Postoperative MRI 01/29/2020. Preoperative MRI FINDINGS: The examination had to be discontinued just prior to completion due to confusion, agitation. Only axial GRE imaging was not obtained. Alignment: Straightening and mild reversal of cervical lordosis remains improved since 01/04/2020. Vertebrae: Abnormal marrow edema and/or T2 and STIR hyperintense soft tissue surrounding the odontoid (series 3, image 7) which has been  partially eroded along with the adjacent clivus and anterior C1 ring as seen on the recent CT. Mild marrow edema in both occipital condyles, greater on the left. Faint marrow edema also in the C2-C3 facets, mostly on the right (series 3, image 2). Overall the appearance has not progressed since 01/22/2020. Sequelae of posterior decompression and fusion from C3 through T1. Mild hardware susceptibility artifact. No other cervical or  upper thoracic marrow edema or acute osseous abnormality. Cord: Suboptimal cord detail due to motion and hardware susceptibility. No definite cervical or upper thoracic spinal cord signal abnormality. Normalized epidural space since August. Posterior Fossa, vertebral arteries, paraspinal tissues: Major vascular flow voids are preserved in the neck. Postoperative changes to the posterior neck soft tissues with no adverse features. Negative visible lung apices. Cervicomedullary junction and visible posterior fossa are stable and within normal limits. Disc levels: C2-C3: Improved thecal sac patency since 01/04/2020. Foraminal endplate spurring and moderate bilateral facet degeneration. Mild residual spinal stenosis. Mild bilateral C3 foraminal stenosis. C3-C4: Chronic central disc protrusion (series 2, image 7) with mild facet and ligament flavum hypertrophy. Improved thecal sac patency since 01/04/2020 but residual mild spinal stenosis and mild cord mass effect. Moderate bilateral C4 foraminal stenosis. C4-C5 through T1-T2: Posterior decompression with no significant spinal stenosis despite residual disc and endplate degeneration at some levels. There is mild to moderate bilateral C8 foraminal stenosis related to disc, endplate and residual facet hypertrophy at C7-T1. IMPRESSION: 1. 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 Possible involvement of the right C2-C3 facet joint also. Associated erosion of bone since 01/28/2020 as demonstrated by CT two days ago. But on MRI no  other significant progression is identified since August; no new levels of involvement. 2. Resolved posterior epidural abscess following surgery last month. Satisfactorily decompressed levels C4-C5 through T1-T2. 3. Improved thecal sac patency but mild residual degenerative spinal stenosis at both C2-C3 and C3-C4. No definite spinal cord signal abnormality. Electronically Signed   By: Genevie Ann M.D.   On: 02/22/2020 11:48   DG CHEST PORT 1 VIEW  Result Date: 03/01/2020 CLINICAL DATA:  Respiratory distress.  COVID. EXAM: PORTABLE CHEST 1 VIEW COMPARISON:  02/24/2020 FINDINGS: The endotracheal tube is been removed in the enteric tube converted for a feeding tube. Enteric tube tip is off the field of view but below the left hemidiaphragm. Right central venous catheter is unchanged in position. No pneumothorax. Cardiac enlargement. Bilateral basilar infiltrates suggesting multifocal pneumonia. IMPRESSION: Bilateral basilar infiltrates suggesting multifocal pneumonia. Electronically Signed   By: Lucienne Capers M.D.   On: 03/01/2020 05:02   DG CHEST PORT 1 VIEW  Result Date: 02/24/2020 CLINICAL DATA:  COVID-19 positive 02/20/2020, respiratory failure, intubated EXAM: PORTABLE CHEST 1 VIEW COMPARISON:  02/23/2020 FINDINGS: Single frontal view of the chest demonstrates endotracheal tube well above carina. Enteric catheter passes below diaphragm tip excluded by collimation. Bilateral internal jugular catheters are unchanged. Cardiac silhouette is stable. Persistent patchy consolidation at the lung bases, unchanged. No effusion or pneumothorax. No acute bony abnormalities. IMPRESSION: 1. Stable support devices. 2. Persistent bibasilar patchy consolidation which could reflect atelectasis or pneumonia. Electronically Signed   By: Randa Ngo M.D.   On: 02/24/2020 18:07   DG CHEST PORT 1 VIEW  Result Date: 02/23/2020 CLINICAL DATA:  Encounter for central line placement EXAM: PORTABLE CHEST 1 VIEW COMPARISON:   02/21/2020 FINDINGS: Endotracheal tube approximately 2 cm above the carina. NG tube enters the stomach with the tip not visualized. Right jugular central venous catheter tip in the right atrium unchanged. New left jugular catheter with the tip in the proximal SVC. No pneumothorax. Mild right lower lobe atelectasis. Negative for edema or effusion IMPRESSION: Left jugular central venous catheter tip in the SVC. No pneumothorax. Mild right lower lobe atelectasis. Endotracheal tube 2 cm above the carina. Electronically Signed   By: Franchot Gallo M.D.   On: 02/23/2020 15:14  DG Chest Port 1 View  Result Date: 02/20/2020 CLINICAL DATA:  COVID EXAM: PORTABLE CHEST 1 VIEW COMPARISON:  01/12/2020 FINDINGS: Interval extubation. Interval placement of large-bore right neck vascular catheter, tip position near the superior cavoatrial junction. The heart size and mediastinal contours are within normal limits. Both lungs are clear. The visualized skeletal structures are unremarkable. IMPRESSION: 1. Interval placement of large-bore right neck vascular catheter, tip position near the superior cavoatrial junction. 2. No acute abnormality of the lungs in AP portable projection. Electronically Signed   By: Eddie Candle M.D.   On: 02/20/2020 13:43   DG Chest Port 1V same Day  Result Date: 03/02/2020 CLINICAL DATA:  Shortness of breath.  Altered mental status. EXAM: PORTABLE CHEST 1 VIEW COMPARISON:  Radiograph yesterday. FINDINGS: Enteric tube remains in place with tip below the diaphragm. Right internal jugular dialysis catheter unchanged. Improving cardiomegaly with stable mediastinal contours. Persistent but improving patchy bibasilar opacities. No pneumothorax. No large pleural effusion. Surgical hardware in the lower cervical spine is partially included. IMPRESSION: 1. Persistent but improving patchy bibasilar opacities, atelectasis versus pneumonia. 2. Improving cardiomegaly. Electronically Signed   By: Keith Rake  M.D.   On: 03/02/2020 15:17   DG Chest Port 1V same Day  Result Date: 02/21/2020 CLINICAL DATA:  Shortness of breath, COVID EXAM: PORTABLE CHEST 1 VIEW COMPARISON:  02/20/2020 FINDINGS: Right dialysis catheter remains in place, unchanged. Heart is normal size. No confluent airspace opacities or effusions. No acute bony abnormality. IMPRESSION: No acute cardiopulmonary disease. Electronically Signed   By: Rolm Baptise M.D.   On: 02/21/2020 13:03   DG Swallowing Func-Speech Pathology  Result Date: 03/03/2020 Objective Swallowing Evaluation: Type of Study: MBS-Modified Barium Swallow Study  Patient Details Name: Durk Carmen. MRN: [id number] Date of Birth: [birth year] Today's Date: 03/03/2020 Time: SLP Start Time (ACUTE ONLY): 5400 -SLP Stop Time (ACUTE ONLY): 1418 SLP Time Calculation (min) (ACUTE ONLY): 24 min Past Medical History: Past Medical History: Diagnosis Date . Anemia  . Arthritis   left knee . Chronic kidney disease   acute renal failure/injury requiring short-term HD 2013 . Colon cancer ([place])  . Colon polyp   11/2011 - Polyps identified, biopsy - invasive adenocarinoma . DM II (diabetes mellitus, type II), controlled ([place])   type 2 IDDM x 15 years. A1C 1/09 13.7.  Marland Kitchen 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 12/25/2011 . GERD (gastroesophageal reflux disease)  . Heart murmur  . Hx of ileostomy 10/13/2012 . Hyperlipidemia  . Hypertension   for a few years and resolved in 2013/2014. . Sleep apnea   does not wear CPAP now . Wears glasses  Past Surgical History: Past Surgical History: Procedure Laterality Date . APPLICATION OF WOUND VAC  8/67/6195  Procedure: APPLICATION OF WOUND VAC;  Surgeon: Zenovia Jarred, MD;  Location: [place];  Service: General;  Laterality: N/A; . APPLICATION OF WOUND VAC  0/93/2671  Procedure: APPLICATION OF WOUND VAC;  Surgeon: Imogene Burn. [name], MD;  Location: [place];  Service: General;  Laterality: N/A; . AV FISTULA PLACEMENT Left 07/29/2019  Procedure:  ARTERIOVENOUS (AV) FISTULA CREATION;  Surgeon: Marty Heck, MD;  Location: [place];  Service: Vascular;  Laterality: Left; . COLONOSCOPY   . COLONOSCOPY  02/06/2012  Procedure: COLONOSCOPY;  Surgeon: Milus Banister, MD;  Location: [place];  Service: Endoscopy;; . COLONOSCOPY WITH PROPOFOL N/A 06/10/2019  Procedure: COLONOSCOPY WITH PROPOFOL;  Surgeon: Milus Banister, MD;  Location: [facility];  Service: Endoscopy;  Laterality:  N/A; . COLOSTOMY REVERSAL   . DRESSING CHANGE UNDER ANESTHESIA  02/18/2012  Procedure: DRESSING CHANGE UNDER ANESTHESIA;  Surgeon: Odis Hollingshead, MD;  Location: [place];  Service: General;  Laterality: N/A; . ESOPHAGOGASTRODUODENOSCOPY (EGD) WITH PROPOFOL N/A 06/10/2019  Procedure: ESOPHAGOGASTRODUODENOSCOPY (EGD) WITH PROPOFOL;  Surgeon: Milus Banister, MD;  Location: [facility];  Service: Endoscopy;  Laterality: N/A; . GANGLION CYST EXCISION  20 YRS AGO  RT ARM . ILEOSTOMY  02/16/2012  Procedure: ILEOSTOMY;  Surgeon: Zenovia Jarred, MD;  Location: [place];  Service: General;  Laterality: N/A; . ILEOSTOMY CLOSURE N/A 09/02/2012  Procedure: ILEOSTOMY REVERSAL;  Surgeon: Imogene Burn. [name], MD;  Location: [place];  Service: General;  Laterality: N/A; . ILIOSTOMY   . INCISION AND DRAINAGE OF WOUND  02/23/2012  Procedure: IRRIGATION AND DEBRIDEMENT WOUND;  Surgeon: Joyice Faster. Cornett, MD;  Location: [place];  Service: General;  Laterality: N/A; . IR DIALY SHUNT INTRO [place] W/IMG LEFT Left 01/21/2020 . IR FLUORO GUIDE CV LINE RIGHT  01/21/2020 . IR REMOVAL TUN CV CATH W/O FL  01/07/2020 . IR US GUIDE VASC ACCESS LEFT  01/21/2020 . IR US GUIDE VASC ACCESS RIGHT  01/21/2020 . LAPAROTOMY  02/16/2012  Procedure: EXPLORATORY LAPAROTOMY;  Surgeon: Zenovia Jarred, MD;  Location: [place];  Service: General;  Laterality: N/A;  Exploratory Laparotomy with resection of anastomosis . LAPAROTOMY  02/18/2012  Procedure: EXPLORATORY LAPAROTOMY;  Surgeon: Odis Hollingshead, MD;  Location: [place];   Service: General;  Laterality: N/A;  exploratory laparotomy  change of abdominal vac dressing . LAPAROTOMY  02/20/2012  Procedure: EXPLORATORY LAPAROTOMY;  Surgeon: Odis Hollingshead, MD;  Location: [place];  Service: General;  Laterality: N/A; . LAPAROTOMY  02/23/2012  Procedure: EXPLORATORY LAPAROTOMY;  Surgeon: Joyice Faster. Cornett, MD;  Location: [place];  Service: General;  Laterality: N/A;  Irrigation and Debridement of abdominal wound with wound vac change with partial closure . LAPAROTOMY  02/26/2012  Procedure: EXPLORATORY LAPAROTOMY;  Surgeon: Imogene Burn. [name], MD;  Location: [place];  Service: General;  Laterality: N/A;   . PARTIAL COLECTOMY  02/06/2012 . PARTIAL COLECTOMY  02/06/2012  Procedure: PARTIAL COLECTOMY;  Surgeon: Imogene Burn. [name], MD;  Location: [place];  Service: General;  Laterality: N/A;  right partial colectomy . POSTERIOR CERVICAL FUSION/FORAMINOTOMY N/A 01/06/2020  Procedure: CERVICAL FOUR- THORACIC TWO LAMINECTOMY AND FUSION;  Surgeon: Consuella Lose, MD;  Location: [place];  Service: Neurosurgery;  Laterality: N/A;  CERVICAL FOUR- THORACIC TWO LAMINECTOMY AND FUSION . RESECTION SMALL BOWEL / CLOSURE ILEOSTOMY  402/2014  Dr [name] . VACUUM ASSISTED CLOSURE CHANGE  02/20/2012  Procedure: ABDOMINAL VACUUM ASSISTED CLOSURE CHANGE;  Surgeon: Odis Hollingshead, MD;  Location: [place];  Service: General;; . VACUUM ASSISTED CLOSURE CHANGE  02/23/2012  Procedure: ABDOMINAL VACUUM ASSISTED CLOSURE CHANGE;  Surgeon: Joyice Faster. Cornett, MD;  Location: [place];  Service: General;  Laterality: N/A; HPI: Eastyn Apelles Azhar Knope. is a 69 y.o. male with medical history significant of GERD, hypertension, hyperlipidemia ESRD on hemodialysis, type 2 diabetes mellitus, anemia of chronic disease, colon cancer status post resection, epidural abscess status post laminectomy, posterior cervical fusion on 01/06/2020 and MRSA bacteremia and presented emergently for evaluation of confusion and COVID-19 positive. Initial BSE 02/21/20 was  limited- needed enouragement, audible swallow, eructation, poor dentition and reduced endurance, Work of breathing was stable. Dys 2/thin recommended. On 9/22 became unresponsive after HD, transferred to ICU and intubated then extubated 9/25. Currently has NGT, encephalopathy improving per  MD note and speech to re-eval. CXR Bilateral basilar infiltrates suggesting multifocal pneumonia.  No data recorded Assessment / Plan / Recommendation CHL IP CLINICAL IMPRESSIONS 03/03/2020 Clinical Impression Pt demonstrates mild orpharyngeal dysphagia mostly related to anatomical differences. 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 In addition, initiation of swallow is intermittently late with boluses reaching the pyriform sinuses for up to 5 seconds prior to swallow. His epiglottis at times does not fully invert adding to inadequate protection. Thin barium was penetrated before and during the swallow remaining superior to the vocal cords. His reflexive throat clear, present most of the time did not clear vestibule and a chin tuck was not effective. Penetration was present once with nectar at end of study. Nectar thick recommended for pt to initiate diet and upgrade to thin from clinical observation with SLP. Dys 2 texture recommended, crush pills, small sips/bites, intermittent throat clear and avoid straws.    SLP Visit Diagnosis Dysphagia, pharyngeal phase (R13.13) Attention and concentration deficit following -- Frontal lobe and executive function deficit following -- Impact on safety and function Moderate aspiration risk   CHL IP TREATMENT RECOMMENDATION 03/03/2020 Treatment Recommendations Therapy as outlined in treatment plan below   Prognosis 03/03/2020 Prognosis for Safe Diet Advancement Good Barriers to Reach Goals -- Barriers/Prognosis Comment -- CHL IP DIET RECOMMENDATION 03/03/2020 SLP Diet Recommendations  Dysphagia 2 (Fine chop) solids;Nectar thick liquid Liquid Administration via Cup Medication Administration Crushed with puree Compensations Slow rate;Small sips/bites;Clear throat intermittently Postural Changes Seated upright at 90 degrees   CHL IP OTHER RECOMMENDATIONS 03/03/2020 Recommended Consults -- Oral Care Recommendations Oral care BID Other Recommendations Order thickener from pharmacy   CHL IP FOLLOW UP RECOMMENDATIONS 03/03/2020 Follow up Recommendations (No Data)   CHL IP FREQUENCY AND DURATION 03/03/2020 Speech Therapy Frequency (ACUTE ONLY) min 2x/week Treatment Duration 2 weeks      CHL IP ORAL PHASE 03/03/2020 Oral Phase Impaired Oral - Pudding Teaspoon -- Oral - Pudding Cup -- Oral - Honey Teaspoon -- Oral - Honey Cup -- Oral - Nectar Teaspoon -- Oral - Nectar Cup WFL Oral - Nectar Straw -- Oral - Thin Teaspoon -- Oral - Thin Cup Decreased bolus cohesion Oral - Thin Straw -- Oral - Puree -- Oral - Mech Soft -- Oral - Regular Delayed oral transit Oral - Multi-Consistency -- Oral - Pill -- Oral Phase - Comment --  CHL IP PHARYNGEAL PHASE 03/03/2020 Pharyngeal Phase Impaired Pharyngeal- Pudding Teaspoon -- Pharyngeal -- Pharyngeal- Pudding Cup -- Pharyngeal -- Pharyngeal- Honey Teaspoon -- Pharyngeal -- Pharyngeal- Honey Cup -- Pharyngeal -- Pharyngeal- Nectar Teaspoon -- Pharyngeal -- Pharyngeal- Nectar Cup Delayed swallow initiation-pyriform sinuses;Reduced epiglottic inversion;Reduced airway/laryngeal closure;Penetration/Aspiration during swallow;Pharyngeal residue - valleculae Pharyngeal Material enters airway, remains ABOVE vocal cords and not ejected out Pharyngeal- Nectar Straw -- Pharyngeal -- Pharyngeal- Thin Teaspoon -- Pharyngeal -- Pharyngeal- Thin Cup Delayed swallow initiation-pyriform sinuses;Pharyngeal residue - valleculae;Pharyngeal residue - pyriform;Penetration/Aspiration before swallow;Reduced airway/laryngeal closure Pharyngeal Material enters airway, remains ABOVE vocal cords and not  ejected out Pharyngeal- Thin Straw -- Pharyngeal -- Pharyngeal- Puree -- Pharyngeal -- Pharyngeal- Mechanical Soft -- Pharyngeal -- Pharyngeal- Regular WFL Pharyngeal -- Pharyngeal- Multi-consistency -- Pharyngeal -- Pharyngeal- Pill -- Pharyngeal -- Pharyngeal Comment --  CHL IP CERVICAL ESOPHAGEAL PHASE 03/03/2020 Cervical Esophageal Phase WFL Pudding Teaspoon -- Pudding Cup -- Honey Teaspoon -- Honey Cup -- Nectar Teaspoon -- Nectar Cup -- Nectar Straw -- Thin Teaspoon --  Thin Cup -- Thin Straw -- Puree -- Mechanical Soft -- Regular -- Multi-consistency -- Pill -- Cervical Esophageal Comment -- [place] Siren 03/03/2020, 4:35 PM Orbie Pyo Colvin Caroli.Ed Actor Pager [phone] Office [phone]              VAS [place] LOWER EXTREMITY VENOUS (DVT)  Result Date: 02/23/2020  Lower Venous DVTStudy Indications: Elevated D dimer. Other Indications: + Covid. Risk Factors: None identified. Performing Technologist: Griffin Basil RCT RDMS  Examination Guidelines: A complete evaluation includes B-mode imaging, spectral Doppler, color Doppler, and power Doppler as needed of all accessible portions of each vessel. Bilateral testing is considered an integral part of a complete examination. Limited examinations for reoccurring indications may be performed as noted. The reflux portion of the exam is performed with the patient in reverse Trendelenburg.  +---------+---------------+---------+-----------+----------+--------------+ RIGHT    CompressibilityPhasicitySpontaneityPropertiesThrombus Aging +---------+---------------+---------+-----------+----------+--------------+ CFV      Full           Yes      Yes                                 +---------+---------------+---------+-----------+----------+--------------+ SFJ      Full                                                        +---------+---------------+---------+-----------+----------+--------------+ FV Prox  Full                                                         +---------+---------------+---------+-----------+----------+--------------+ FV Mid   Full                                                        +---------+---------------+---------+-----------+----------+--------------+ FV DistalFull                                                        +---------+---------------+---------+-----------+----------+--------------+ PFV      Full                                                        +---------+---------------+---------+-----------+----------+--------------+ POP      Full           Yes      Yes                                 +---------+---------------+---------+-----------+----------+--------------+ PTV      Full                                                        +---------+---------------+---------+-----------+----------+--------------+  PERO     Full                                                        +---------+---------------+---------+-----------+----------+--------------+   +---------+---------------+---------+-----------+----------+--------------+ LEFT     CompressibilityPhasicitySpontaneityPropertiesThrombus Aging +---------+---------------+---------+-----------+----------+--------------+ CFV      Full           Yes      Yes                                 +---------+---------------+---------+-----------+----------+--------------+ SFJ      Full                                                        +---------+---------------+---------+-----------+----------+--------------+ FV Prox  Full                                                        +---------+---------------+---------+-----------+----------+--------------+ FV Mid   Full                                                        +---------+---------------+---------+-----------+----------+--------------+ FV DistalFull                                                         +---------+---------------+---------+-----------+----------+--------------+ PFV      Full                                                        +---------+---------------+---------+-----------+----------+--------------+ POP      Full           Yes      Yes                                 +---------+---------------+---------+-----------+----------+--------------+ PTV      Full                                                        +---------+---------------+---------+-----------+----------+--------------+ PERO     Full                                                        +---------+---------------+---------+-----------+----------+--------------+  Summary: RIGHT: - There is no evidence of deep vein thrombosis in the lower extremity.  - No cystic structure found in the popliteal fossa.  LEFT: - There is no evidence of deep vein thrombosis in the lower extremity.  - No cystic structure found in the popliteal fossa.  *See table(s) above for measurements and observations. Electronically signed by Harold Barban MD on 02/23/2020 at 9:31:43 PM.    Final    US Abdomen Limited RUQ  Result Date: 02/23/2020 CLINICAL DATA:  Transaminitis EXAM: ULTRASOUND ABDOMEN LIMITED RIGHT UPPER QUADRANT COMPARISON:  None. FINDINGS: Gallbladder: The gallbladder was not visualized and may be surgically absent Common bile duct: Diameter: 3 mm Liver: The liver parenchyma is coarsened and heterogeneous. The liver surface appears somewhat nodular there is no discrete hepatic mass. There is suggestion of bidirectional flow within the portal vein Other: The right kidney appears atrophic and echogenic. There are multiple right-sided renal cysts measuring up to approximately 3.6 cm. IMPRESSION: 1. Gallbladder not visualized. 2. Coarsened and heterogeneous appearance of the liver parenchyma with findings suspicious for underlying cirrhosis. 3. Borderline bidirectional flow within the main portal vein is suggestive  of portal hypertension. 4. Echogenic and atrophic appearing right kidney consistent with the patient's history of renal failure. Electronically Signed   By: Constance Holster M.D.   On: 02/23/2020 19:21

## 2020-03-13 NOTE — Progress Notes (Signed)
The patient is currently refusing his medications and his dialysis treatment.  Dr Candiss Norse was notified and was asked to contact the patient's daughter.  Renee, the patient's daughter is currently speaking with the patient.  The patient is pleasant and is aware of the consequences if he refuses dialysis and his medications.

## 2020-03-13 NOTE — Plan of Care (Signed)
  Problem: Education: Goal: Knowledge of General Education information will improve Description: Including pain rating scale, medication(s)/side effects and non-pharmacologic comfort measures Outcome: Progressing   Problem: Health Behavior/Discharge Planning: Goal: Ability to manage health-related needs will improve Outcome: Progressing   Problem: Clinical Measurements: Goal: Ability to maintain clinical measurements within normal limits will improve Outcome: Progressing Goal: Will remain free from infection Outcome: Progressing Goal: Diagnostic test results will improve Outcome: Progressing Goal: Respiratory complications will improve Outcome: Progressing Goal: Cardiovascular complication will be avoided Outcome: Progressing   Problem: Activity: Goal: Risk for activity intolerance will decrease Outcome: Progressing   Problem: Nutrition: Goal: Adequate nutrition will be maintained Outcome: Progressing   Problem: Coping: Goal: Level of anxiety will decrease Outcome: Progressing   Problem: Elimination: Goal: Will not experience complications related to bowel motility Outcome: Progressing Goal: Will not experience complications related to urinary retention Outcome: Progressing   Problem: Pain Managment: Goal: General experience of comfort will improve Outcome: Progressing   Problem: Safety: Goal: Ability to remain free from injury will improve Outcome: Progressing   Problem: Skin Integrity: Goal: Risk for impaired skin integrity will decrease Outcome: Progressing   Problem: Education: Goal: Knowledge of risk factors and measures for prevention of condition will improve Outcome: Progressing   Problem: Coping: Goal: Psychosocial and spiritual needs will be supported Outcome: Progressing   Problem: Respiratory: Goal: Will maintain a patent airway Outcome: Progressing Goal: Complications related to the disease process, condition or treatment will be avoided or  minimized Outcome: Progressing   Problem: Education: Goal: Knowledge of disease and its progression will improve Outcome: Progressing Goal: Individualized Educational Video(s) Outcome: Progressing   Problem: Fluid Volume: Goal: Compliance with measures to maintain balanced fluid volume will improve Outcome: Progressing   Problem: Health Behavior/Discharge Planning: Goal: Ability to manage health-related needs will improve Outcome: Progressing   Problem: Nutritional: Goal: Ability to make healthy dietary choices will improve Outcome: Progressing   Problem: Clinical Measurements: Goal: Complications related to the disease process, condition or treatment will be avoided or minimized Outcome: Progressing

## 2020-03-13 NOTE — Progress Notes (Signed)
Physical Therapy Treatment Patient Details Name: [name]. MRN: [id number] DOB: 1951/05/14 Today's Date: 03/13/2020    History of Present Illness Pt is a 69 y.o. male with d/c to SNF after prolonged complicated admission s/p C4-T2 laminectomy fusion (01/06/20), now admitted from [facility] 02/20/20 with confusion. Workup for metabolic encephalopathy, shock hypoxia; (+) COVID-19. 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 ETT 9/22-9/25. PMH includes HTN, HLD, ESRD (HD MWF), colon CA s/p resection. Had fall during hospitalization 10/7.    PT Comments    Pt supine on arrival, agreeable to therapy session with fair participation and tolerance for session. Pt able to use bed features and +2 minA for transition to EOB. Pt performed transfers with +16minA for safety, but demonstrates limited standing tolerance (~15-30 seconds each attempt) and deferring to attempt pre-gait standing tasks or exercises at this time. Pt tolerated sitting EOB ~5 minutes total with min guard/Supervision. Per RN, pt has been agitated intermittently and refusing meds and hemodialysis today. Pt continues to benefit from PT services to progress toward functional mobility goals, will focus on transfers and pre-gait training next session. Continue to recommend SNF level of rehab.   Follow Up Recommendations  SNF;Supervision/Assistance - 24 hour     Equipment Recommendations  None recommended by PT    Recommendations for Other Services       Precautions / Restrictions Precautions Precautions: Fall Restrictions Weight Bearing Restrictions: No    Mobility  Bed Mobility Overal bed mobility: Needs Assistance Bed Mobility: Sit to Supine;Sidelying to Sit;Rolling Rolling: Min guard Sidelying to sit: Min assist;+2 for physical assistance;HOB elevated   Sit to supine: Min assist;+2 for safety/equipment   General bed mobility comments: pt sat EOB with close Supervision to min  guard assist ~5 minutes total  Transfers Overall transfer level: Needs assistance Equipment used: Rolling walker (2 wheeled) Transfers: Sit to/from Stand Sit to Stand: Min assist;+2 physical assistance;From elevated surface         General transfer comment: static standing at RW with minA of one, increased assist for stand to sit as pt impulsive to sit and fatigues quickly  Ambulation/Gait                 Stairs             Wheelchair Mobility    Modified Rankin (Stroke Patients Only)       Balance Overall balance assessment: Needs assistance Sitting-balance support: Feet supported Sitting balance-Leahy Scale: Fair Sitting balance - Comments: Sitting at EOB with Min guard A to close Supervision Postural control: Posterior lean Standing balance support: Bilateral upper extremity supported Standing balance-Leahy Scale: Poor Standing balance comment: minA for static balance, UTA dynamic standing tasks as pt refusing to attempt pre-gait tasks                            Cognition Arousal/Alertness: Awake/alert Behavior During Therapy: Restless;Agitated;Impulsive Overall Cognitive Status: Impaired/Different from baseline Area of Impairment: Attention;Memory;Following commands;Safety/judgement;Awareness;Problem solving                 Orientation Level: Situation;Time;Place;Disoriented to   Memory: Decreased short-term memory Following Commands: Follows one step commands with increased time;Follows one step commands inconsistently Safety/Judgement: Decreased awareness of safety;Decreased awareness of deficits   Problem Solving: Slow processing;Difficulty sequencing;Requires verbal cues;Requires tactile cues General Comments: pt with limited participation and perseverating on laying back down, agreeable to sit<>stand but refusing pre-gait tasks or  further tranfers      Exercises Other Exercises Other Exercises: pt refusing to attempt this  session    General Comments        Pertinent Vitals/Pain Pain Assessment: No/denies pain Pain Score: 0-No pain Faces Pain Scale: Hurts a little bit Pain Location: back Pain Descriptors / Indicators: Grimacing Pain Intervention(s): Monitored during session;Repositioned (pt encouraged to lay with pillow behind back to offload-refu)    Home Living                      Prior Function            PT Goals (current goals can now be found in the care plan section) Acute Rehab PT Goals Patient Stated Goal: "I want to go home" PT Goal Formulation: Patient unable to participate in goal setting Time For Goal Achievement: 03/06/20 Potential to Achieve Goals: Fair Progress towards PT goals: Progressing toward goals (slow progress towards goals)    Frequency    Min 2X/week      PT Plan Current plan remains appropriate    Co-evaluation              AM-PAC PT "6 Clicks" Mobility   Outcome Measure  Help needed turning from your back to your side while in a flat bed without using bedrails?: A Little Help needed moving from lying on your back to sitting on the side of a flat bed without using bedrails?: A Little Help needed moving to and from a bed to a chair (including a wheelchair)?: Total Help needed standing up from a chair using your arms (e.g., wheelchair or bedside chair)?: A Lot Help needed to walk in hospital room?: Total Help needed climbing 3-5 steps with a railing? : Total 6 Click Score: 11    End of Session Equipment Utilized During Treatment: Gait belt Activity Tolerance: Patient limited by fatigue;Treatment limited secondary to agitation Patient left: in bed;with bed alarm set;with call bell/phone within reach (bed at lowest height and fall mats x2 in place) Nurse Communication: Mobility status;Other (comment) (RN notified pt has small cut to L big toe, will need bandaid) PT Visit Diagnosis: Other abnormalities of gait and mobility (R26.89);Muscle  weakness (generalized) (M62.81);Other symptoms and signs involving the nervous system (R29.898)     Time: 4825-0037 PT Time Calculation (min) (ACUTE ONLY): 20 min  Charges:  $Therapeutic Activity: 8-22 mins                     Osiel Stick P., PTA Acute Rehabilitation Services Pager: [phone] Office: [place] 03/13/2020, 2:57 PM

## 2020-03-13 NOTE — Progress Notes (Signed)
  NEUROSURGERY PROGRESS NOTE   Received call from Dr Wallace Keller regarding patient. 69 year old male with history of end-stage renal disease, diabetes type 2 and MSRA bacteremia with epidural abscess status post C4-T2 laminectomy & fusion on 08/5, currently admitted to the hospital for altered mental status in the setting of COVID-19, now off isolation.   Patient has been hospitalized and therefore has not had outpatient follow-up as of yet.  His staples remain in place.  I was kindly asked to assess his wound and remove his staples.  Exam: Cervico-thoracic wound is well approximated. Staples in place. Area is c/d/i without overt signs of infection. No drainage. He moves all extremities well.  CT C spine shows hardware in good position. No evidence of failure.   A/P: 2 months s/p C4-T2 laminectomy and fusion. Doing well Staples were removed today. Patient tolerated well. Will f/u outpatient in 3 weeks for continued monitoring. Please call if we can be of further assistance.  Ferne Reus, PA-C [facility] [employer]

## 2020-03-13 NOTE — Progress Notes (Signed)
Per pt's primary nurse, pt continues to refuse HD tx after multiple attempts.

## 2020-03-13 NOTE — Progress Notes (Signed)
The patient is refusing all medications and refusing to go to dialysis.  Dr Candiss Norse notified of the patient refusing treatment

## 2020-03-13 NOTE — Progress Notes (Signed)
Nutrition Follow-up  DOCUMENTATION CODES:   Non-severe (moderate) malnutrition in context of chronic illness  INTERVENTION:   Patient would benefit from Cortrak replacement as he has been without adequate nutrition since admit and is malnourished, suspect he will refuse.    Magic cup TID with meals, each supplement provides 290 kcal and 9 grams of protein  Nepro Shake po BID, each supplement provides 425 kcal and 19 grams protein  Renal MVI daily   NUTRITION DIAGNOSIS:   Moderate Malnutrition related to chronic illness (ESRD on HD; now COVID) as evidenced by moderate fat depletion, moderate muscle depletion   Ongoing  GOAL:   Patient will meet greater than or equal to 90% of their needs;   Not meeting   MONITOR:   PO intake, Supplement acceptance, Skin, Weight trends, Labs, I & O's  REASON FOR ASSESSMENT:   Consult Enteral/tube feeding initiation and management  ASSESSMENT:   69 yo male admitted with acute metabolic encephalopathy likely in setting of COVID-19 infection, developed respiratory failure and septic shock requiring transfer to ICU and intubation. PMH includes ESRD on HD, liver cirrhosis, colon cancer s/p hemicolectomy, polysubstance abuse  9/18 COVID+ dx 9/19 Admission 9/22 Agonal breathing post iHD, hypoglycemic, hypotensive, lethargic; transferred to ICU, Intubated 9/25 extubated 9/27 precedex stopped; cortrak placed with tip gastric  10/1 Diet advanced to dysphagia 2 diet with nectar thick liquids 10/2 Cortrak NGT removed 10/7 Diet advanced to DYS 3 with thin liquids   Pt refusing HD treatments, insulin, other medications, and Nepro shakes. Last 8 meal completions charted as 0-50%. Per RN pt eating better today. He would benefit from Cortrak replacement as he has been without adequate nutrition since admit but suspect he will refuse.   EDW: 87 kg  Current weight: 85.3 kg   Medications: calcitriol, aranesp, SS novolog, thiamine, zinc  sulfate Labs: CBG 87-159  Diet Order:   Diet Order            DIET DYS 3 Room service appropriate? No; Fluid consistency: Thin  Diet effective now                 EDUCATION NEEDS:   Not appropriate for education at this time  Skin:  Skin Assessment: Skin Integrity Issues: Skin Integrity Issues:: Unstageable Unstageable: buttocks  Last BM:  10/10  Height:   Ht Readings from Last 1 Encounters:  02/21/20 6' (1.829 m)    Weight:   Wt Readings from Last 1 Encounters:  03/11/20 85.3 kg   BMI:  Body mass index is 25.5 kg/m.  Estimated Nutritional Needs:   Kcal:  2050-2260 kcals  Protein:  130-150 g  Fluid:  1000 mL plus UOP  Mariana Single RD, LDN Clinical Nutrition Pager listed in [place]

## 2020-03-14 DIAGNOSIS — G9341 Metabolic encephalopathy: Secondary | ICD-10-CM | POA: Diagnosis not present

## 2020-03-14 LAB — RENAL FUNCTION PANEL
Albumin: 1.9 g/dL — ABNORMAL LOW (ref 3.5–5.0)
Anion gap: 9 (ref 5–15)
BUN: 38 mg/dL — ABNORMAL HIGH (ref 8–23)
CO2: 19 mmol/L — ABNORMAL LOW (ref 22–32)
Calcium: 7.7 mg/dL — ABNORMAL LOW (ref 8.9–10.3)
Chloride: 107 mmol/L (ref 98–111)
Creatinine, Ser: 10.4 mg/dL — ABNORMAL HIGH (ref 0.61–1.24)
GFR, Estimated: 5 mL/min — ABNORMAL LOW (ref >60)
Glucose, Bld: 134 mg/dL — ABNORMAL HIGH (ref 70–99)
Phosphorus: 4.9 mg/dL — ABNORMAL HIGH (ref 2.5–4.6)
Potassium: 4.5 mmol/L (ref 3.5–5.1)
Sodium: 135 mmol/L (ref 135–145)

## 2020-03-14 LAB — GLUCOSE, CAPILLARY
Glucose-Capillary: 74 mg/dL (ref 70–99)
Glucose-Capillary: 124 mg/dL — ABNORMAL HIGH (ref 70–99)
Glucose-Capillary: 361 mg/dL — ABNORMAL HIGH (ref 70–99)
Glucose-Capillary: 69 mg/dL — ABNORMAL LOW (ref 70–99)
Glucose-Capillary: 111 mg/dL — ABNORMAL HIGH (ref 70–99)
Glucose-Capillary: 114 mg/dL — ABNORMAL HIGH (ref 70–99)

## 2020-03-14 LAB — CBC
HCT: 24.4 % — ABNORMAL LOW (ref 39.0–52.0)
Hemoglobin: 7.7 g/dL — ABNORMAL LOW (ref 13.0–17.0)
MCH: 26.7 pg (ref 26.0–34.0)
MCHC: 31.6 g/dL (ref 30.0–36.0)
MCV: 84.7 fL (ref 80.0–100.0)
Platelets: 146 10*3/uL — ABNORMAL LOW (ref 150–400)
RBC: 2.88 MIL/uL — ABNORMAL LOW (ref 4.22–5.81)
RDW: 18.9 % — ABNORMAL HIGH (ref 11.5–15.5)
WBC: 4.7 10*3/uL (ref 4.0–10.5)
nRBC: 0 % (ref 0.0–0.2)

## 2020-03-14 NOTE — Progress Notes (Signed)
[facility] Progress Note  Subjective:  Refused dialysis yesterday.  Still refusing meds today per nursing, but agrees to go to dialysis today.   Oriented to self, knows he's in the hospital  Vitals:   03/13/20 1109 03/13/20 1452 03/13/20 1958 03/14/20 0743  BP: (!) 160/64 (!) 167/76  (!) 179/81  Pulse:  79 73 87  Resp:  17 18 18   Temp:  99.1 F (37.3 C) 97.9 F (36.6 C) 98 F (36.7 C)  TempSrc:  Oral  Oral  SpO2:  100% 99% 100%  Weight:      Height:        Exam:  alert, nad   no jvd  Chest cta bilat  Cor reg no RG  Abd soft ntnd no ascites   Ext no LE edema   Alert, NF, ox3  +TDC/ L AVF+bruit     CXR 9/23 - patchy perihilar/ basilar dz, no edema      OP HD: [facility] MWF   4h 23min  87kg  2/2 bath  TDC / L AVF  Hep none  -Mircera 150 mcg IV q 2 weeks (last dose 02/09/2020) -Venofer 100 mg IV X 8 doses (not started yet) -Calcitriol 1.5 mcg PO TIW  Assessment/ Plan: 1. COVID PNA/ HCAP (EColi/ MSSA resp culture): Tested positive at [facility] 09/18 and again here. Got IV cefazolin thru 10/1. Remdesivir stopped 9/22 d/t ^LFT's.  COVID isolation ends  03/12/20.  2. Acute hypoxic resp failure.  At one point required mechanical ventilation. Resolved.  3. AMS - Mentation better but he continues to refuse some treatments however. Seen by psychiatry 10/10 and refused to discuss anything with them. Psychiatry suggesting guardianship. Per pmd.  4. Recent epidural abscess/MRSA cervical discitis-  S/p decompression surgery C4-T2 on 01/06/20 during prior admission. Pt admitted this time on 9/19 w/ AMS.  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  NSurg recommended no further surgery. Plans w/ ID were for Vancomycin and Rifampin here through 10/01 but rifampin dc'd early due to ^LFT"s. PO doxy started on 10/01 and pt is now off IV vanc. Will f/u w/ ID clinic in 3-4 wks, they will guide on the stop date of the po doxy. 5.  ESRD - MWF HD. HIT testing was  negative. OK to use heparin to block catheter now. Last HD Sat 10/9. Refused dialysis Monday. HD off schedule today.  6.  HD access - L BC AVF placed Feb 2021. Was used starting in May and had infiltration in Aug prompting [facility] placement to rest the AVF. Have used AVF w/o issues on last 3 HD sessions. TDC would be ready for removal, but due to lack of good IV access have kept in for now.  7.  Hypertension/volume - Previously on high dose midodrine here and this is now off.  Keep even on HD, has not tolerated UF here lately.  8. Anemia  - aranesp increased to 150 mcg for 9/29 onward for now. Hold OP Fe load.  s/p PRBC's 9/29 and may need again soon  9.  Metabolic bone disease - off renvela due to hypophosphatemia and improved last check.  Anticipate need to resume at a lower dose per trends. on calcitriol - corrected Ca ok 10.  DM-per primary 11.  Low plts - 2nd level testing for HIT was negative.  38   H/o HCV/ liver cirrhosis - hep C testing here was negative. Per pmd will need GI f/u in OP  setting.  13.   Loose stools/ sacral wound - flexiseal in place now 14.  Dispo - awaiting SNF placement  Lynnda Child PA-C [facility] 03/14/2020,10:58 AM   Recent Labs  Lab 03/08/20 0808 03/09/20 1031 03/10/20 1109 03/10/20 1109 03/11/20 0233 03/12/20 1747  K 3.8  --  4.2  --   --   --   BUN 40*  --  35*  --   --   --   CREATININE 8.04*  --  8.71*  --   --   --   CALCIUM 7.7*  --  7.5*  --   --   --   PHOS 5.1*  --  4.9*  --   --   --   HGB 5.9*   < > 7.9*   < > 7.8* 8.4*   < > = values in this interval not displayed.   Inpatient medications: . amLODipine  10 mg Oral Daily  . calcitRIOL  1.5 mcg Oral Q M,W,F-HD  . chlorhexidine  15 mL Mouth Rinse BID  . Chlorhexidine Gluconate Cloth  6 each Topical Q0600  . collagenase   Topical Daily  . darbepoetin (ARANESP) injection - DIALYSIS  150 mcg Intravenous Q Wed-HD  . doxycycline  100 mg Oral Q12H  . feeding supplement  (NEPRO CARB STEADY)  237 mL Oral BID BM  . insulin aspart  0-5 Units Subcutaneous QHS  . insulin aspart  0-9 Units Subcutaneous TID WC  . mouth rinse  15 mL Mouth Rinse q12n4p  . multivitamin  1 tablet Oral QHS  . pantoprazole  40 mg Oral Daily  . QUEtiapine  50 mg Oral BID  . thiamine  100 mg Oral Daily  . zinc sulfate  220 mg Oral Daily    haloperidol lactate, lip balm, loperamide, metoprolol tartrate, [DISCONTINUED] ondansetron **OR** ondansetron (ZOFRAN) IV, Resource ThickenUp Clear

## 2020-03-14 NOTE — Progress Notes (Signed)
Safety sitter at the bedside.  The patient continues to attempt to get up on his own at time and multiple request for Bathroom assist.  Continued education on safety given to the patient.  The patient will verbalize that he understands

## 2020-03-14 NOTE — Progress Notes (Signed)
PROGRESS NOTE    [name].  DUK:[id number] DOB: [birth year] DOA: 02/20/2020 PCP: [facility]   Brief Narrative:69 y.o. male with PMHx of ESRD on HD MWF, DM-2, MRSA bacteremia with epidural abscess s/p C4-T2 laminectomy/fusion on 01/06/2020-on IV vancomycin/rifampin through 10/1-resident of SNF-apparently tested positive for COVID-19 on 9/18-subsequently brought to the ED on 9/19 for worsening confusion (more than baseline)-fever-thought to have acute metabolic encephalopathy from COVID-19 infection and admitted to the hospitalist service.  Further hospital course complicated by development of hypotension/severe encephalopathy requiring intubation and ICU transfer.   Significant Events: 8/3-8/23>> hospitalized for epidural abscess/MRSA bacteremia-underwent laminectomy-briefly intubated due to altered mental status.  Discharged to SNF 9/18>> COVID-19 positive at SNF 9/19>> Admit to [facility] for fever/confusion 9/22>> obtunded/hypotensive after coming back from hemodialysis-emergently intubated/transferred to ICU 9/30>> [facility]>> SQ heparin stopped-pharmacy consulted for HIT protocol.  Significant studies: 8/6>> Echo: EF> 75% 9/19>>Chest x-ray: No acute abnormality in the lung. 9/19>> CT head: No acute intracranial abnormality 9/19>> CT C-spine: Postoperative findings of posterior laminectomy/fusion C4-T1, prevertebral soft tissue edema appears reduced, postoperative fluid collection overlies laminectomy site does not appear significantly changed compared to prior examination. 9/20>> MRI C-spine: Attempted but due to AMS-not completed 9/21>> MRI C Spine: Osteomyelitis of skull base, anterior C1 and odontoid, C2-C3 facet joint-associated erosion of bone since 8/27-resolved epidural abscess 9/22>> coarsened/heterogeneous appearance of the liver parenchyma-suspicious for cirrhosis, gallbladder not visualized. 9/22>> bilateral lower extremity Doppler: No  DVT 9/29>> chest x-ray: Bibasilar infiltrates suggesting multifocal pneumonia    Assessment & Plan:   Principal Problem:   Acute metabolic encephalopathy Active Problems:   Hyperlipidemia   Hyperkalemia   HYPERTENSION   Anemia of chronic disease   Type 2 diabetes mellitus with renal complication (HCC)   ESRD (end stage renal disease) (HCC)   Epidural abscess   COVID-19   Hx of bacteremia   Goals of care, counseling/discussion   Palliative care by specialist   DNR (do not resuscitate) discussion   #1 acute hypoxic respiratory failure secondary to HCAP and COVID-19 pneumonia resolved. Patient received remdesivir and Decadron. He decompensated on 02/23/2020 transfer to ICU intubated and extubated on 02/27/2020. Sputum culture grew E. coli and MSSA. He was treated with cefazolin till 03/03/2020. Remdesivir was stopped prior to finishing the course due to significantly elevated LFTs. Patient is not vaccinated for Covid. Currently he is on room air saturating normal. He is off Covid isolation as of 03/12/2020.  #2 acute metabolic encephalopathy likely multifactorial. Uremia versus sepsis. Patient was placed on Precedex in the ICU. He continues to refuse treatments his mental status has improved and is back to baseline. He refused to see a psychiatrist.  Per prior attending--Has Capacity - have explained to the daughter that his mentation actually has improved in terms of encephalopathy and now he clearly understands what he is doing and the consequences of doing so, this was explained to her in detail on 03/13/2020.     - Patient does appear to have the capacity to make an informed decision to agree to or refuse treatment, clearly understands that refusing medications and lab draws can result to disability or death, says I am no baby and I clearly understand what I am doing and know the consequences, he certainly realizes that he is sick and he is at [facility] and that he recently  recovered from COVID-19 infection, he realizes that he had neck surgery and that his neck problems are much improved.  -  Patient is able to express a choice of either - to accept or refuse treatment plan or medications (does not frequently change decisions).   - Patientdoes notlack an understanding of the nature of the medication or procedures and the associated risks, benefits, and/or alternatives.   -Patient does notlack an appreciation of the nature of his/her current medical condition.  - Patient is able to rationally manipulate information to make an informed decision.  #2 history of MRSA bacteremia with epidural abscess requiring C4-T2 laminectomy by Dr. Kathyrn Sheriff 01/06/2020 with residual osteomyelitis of the skull base. MRI of the C-spine appears to still show residual osteomyelitis. Prior attending discussed with neurosurgery no further surgical intervention was planned or is planned. Plan was to continue vancomycin and rifampin through October 1 however rifampin had to be stopped due to significantly elevated LFTs. He is now on doxycycline started 03/03/2020 per primary critical care and ID pharmacist. Patient to follow-up with ID clinic 3 to 4 weeks after discharge. He will also need follow-up with neurosurgery upon discharge. Will DC staples from the neck area today.  #3 status post septic shock requiring pressors likely secondary to HCAP.  #4 dysphagia on dysphagia diet speech therapy following.  #5 thrombocytopenia likely secondary to sepsis. HIT has been ruled out.  #6 history of liver cirrhosis and HCV will need outpatient GI follow-up.  #7 ESRD on dialysis Monday Wednesday Friday patient has been refusing dialysis. However he has agreed to go for dialysis today.  #8 sacral wound patient with ongoing diarrhea with Flexi-Seal in place. As needed Imodium. Flexi-Seal is in place basically to help the wound heal.   #9 history of essential hypertension continue  Norvasc  #9 history of hyperlipidemia statin was on hold or is on hold due to recent elevated LFTs consider restarting prior to discharge.  #10 type 2 diabetes continue SSI hemoglobin A1c 6.5  Pressure Injury 03/05/20 Buttocks Right;Left Unstageable - Full thickness tissue loss in which the base of the injury is covered by slough (yellow, tan, gray, green or brown) and/or eschar (tan, brown or black) in the wound bed. (Active)  03/05/20 2040  Location: Buttocks  Location Orientation: Right;Left  Staging: Unstageable - Full thickness tissue loss in which the base of the injury is covered by slough (yellow, tan, gray, green or brown) and/or eschar (tan, brown or black) in the wound bed.  Wound Description (Comments):   Present on Admission: No      Nutrition Problem: Moderate Malnutrition Etiology: chronic illness (ESRD on HD; now COVID)     Signs/Symptoms: moderate fat depletion, moderate muscle depletion    Interventions: Nepro shake  Estimated body mass index is 25.5 kg/m as calculated from the following:   Height as of this encounter: 6' (1.829 m).   Weight as of this encounter: 85.3 kg.  DVT prophylaxis: SCD due to worsening thrombocytopenia. Heparin is entered as an allergy. Code Status: Full code Family Communication: None at bedside  disposition Plan:  Status is: Inpatient  Dispo: The patient is from: SNF              Anticipated d/c is to: SNF              Anticipated d/c date is: > 3 days              Patient currently is not medically stable to d/c.  Consultants:   PCCM nephrology, GI  Procedures: Intubation 9/22 through 02/26/2020 Antimicrobials: Anti-infectives (From admission, onward)   Start  Dose/Rate Route Frequency Ordered Stop   03/06/20 2000  doxycycline (VIBRA-TABS) tablet 100 mg        100 mg Oral Every 12 hours 03/06/20 1632     03/04/20 1000  doxycycline (VIBRA-TABS) tablet 100 mg  Status:  Discontinued        100 mg Oral Every 12 hours  03/01/20 0944 03/04/20 0741   03/04/20 1000  doxycycline (VIBRA-TABS) tablet 100 mg  Status:  Discontinued        100 mg Per Tube Every 12 hours 03/04/20 0741 03/06/20 1632   02/27/20 1500  ceFAZolin (ANCEF) IVPB 1 g/50 mL premix        1 g 100 mL/hr over 30 Minutes Intravenous Every 24 hours 02/27/20 1433 03/02/20 1445   02/25/20 1000  piperacillin-tazobactam (ZOSYN) IVPB 2.25 g  Status:  Discontinued        2.25 g 100 mL/hr over 30 Minutes Intravenous Every 8 hours 02/25/20 0905 02/27/20 1433   02/25/20 0830  piperacillin-tazo (ZOSYN) NICU IV syringe 225 mg/mL  Status:  Discontinued        100 mg/kg of piperacillin  84.3 kg 84.4 mL/hr over 30 Minutes Intravenous Every 8 hours 02/25/20 0825 02/25/20 0905   02/25/20 0814  vancomycin (VANCOCIN) 1-5 GM/200ML-% IVPB       Note to Pharmacy: Cherylann Banas   : cabinet override      02/25/20 0814 02/25/20 1158   02/23/20 0642  vancomycin (VANCOCIN) 1-5 GM/200ML-% IVPB       Note to Pharmacy: [place], [place] : cabinet override      02/23/20 0642 02/23/20 1859   02/21/20 1200  vancomycin (VANCOCIN) IVPB 1000 mg/200 mL premix        1,000 mg 200 mL/hr over 60 Minutes Intravenous Every M-W-F (Hemodialysis) 02/20/20 1959 03/03/20 1130   02/21/20 1000  remdesivir 100 mg in sodium chloride 0.9 % 100 mL IVPB  Status:  Discontinued       "Followed by" Linked Group Details   100 mg 200 mL/hr over 30 Minutes Intravenous Daily 02/20/20 1632 02/23/20 0633   02/20/20 2000  rifampin (RIFADIN) capsule 300 mg  Status:  Discontinued        300 mg Oral Every 12 hours 02/20/20 1907 02/23/20 0633   02/20/20 1730  remdesivir 200 mg in sodium chloride 0.9% 250 mL IVPB       "Followed by" Linked Group Details   200 mg 580 mL/hr over 30 Minutes Intravenous Once 02/20/20 1632 02/20/20 1913       Subjective: He is resting in bed appears to be no acute distress He tells me he will allow blood draws and dialysis today.  Objective: Vitals:   03/13/20 1109  03/13/20 1452 03/13/20 1958 03/14/20 0743  BP: (!) 160/64 (!) 167/76  (!) 179/81  Pulse:  79 73 87  Resp:  17 18 18   Temp:  99.1 F (37.3 C) 97.9 F (36.6 C) 98 F (36.7 C)  TempSrc:  Oral  Oral  SpO2:  100% 99% 100%  Weight:      Height:        Intake/Output Summary (Last 24 hours) at 03/14/2020 1248 Last data filed at 03/14/2020 1130 Gross per 24 hour  Intake 350 ml  Output 200 ml  Net 150 ml   Filed Weights   03/09/20 0406 03/10/20 0500 03/11/20 1356  Weight: 84.7 kg 85.3 kg 85.3 kg    Examination: Patient resting in bed in no acute distress General exam: Appears  calm and comfortable  Respiratory system: Clear to auscultation. Respiratory effort normal. Cardiovascular system: S1 & S2 heard, RRR. No JVD, murmurs, rubs, gallops or clicks. No pedal edema. Gastrointestinal system: Abdomen is nondistended, soft and nontender. No organomegaly or masses felt. Normal bowel sounds heard. Central nervous system: Alert and oriented. No focal neurological deficits. Extremities: Symmetric 5 x 5 power. Skin: Staples in place behind the C-spines  psychiatry: Unable to assess    Data Reviewed: I have personally reviewed following labs and imaging studies  CBC: Recent Labs  Lab 03/08/20 0808 03/09/20 1031 03/10/20 1109 03/11/20 0233 03/12/20 1747  WBC 7.8 11.5* 7.1 6.7 6.6  HGB 5.9* 8.8* 7.9* 7.8* 8.4*  HCT 18.4* 26.7* 24.9* 24.0* 25.9*  MCV 84.0 84.8 85.6 84.5 86.0  PLT 127* 150 139* 130* 481   Basic Metabolic Panel: Recent Labs  Lab 03/08/20 0808 03/10/20 1109  NA 139 138  K 3.8 4.2  CL 103 103  CO2 23 25  GLUCOSE 83 121*  BUN 40* 35*  CREATININE 8.04* 8.71*  CALCIUM 7.7* 7.5*  PHOS 5.1* 4.9*   GFR: Estimated Creatinine Clearance: 8.9 mL/min (A) (by C-G formula based on SCr of 8.71 mg/dL (H)). Liver Function Tests: Recent Labs  Lab 03/08/20 0808 03/10/20 1109  ALBUMIN 2.0* 2.0*   No results for input(s): LIPASE, AMYLASE in the last 168 hours. No  results for input(s): AMMONIA in the last 168 hours. Coagulation Profile: No results for input(s): INR, PROTIME in the last 168 hours. Cardiac Enzymes: No results for input(s): CKTOTAL, CKMB, CKMBINDEX, TROPONINI in the last 168 hours. BNP (last 3 results) No results for input(s): PROBNP in the last 8760 hours. HbA1C: No results for input(s): HGBA1C in the last 72 hours. CBG: Recent Labs  Lab 03/13/20 0705 03/13/20 1040 03/13/20 2018 03/14/20 0624 03/14/20 1111  GLUCAP 87 142* 138* 74 124*   Lipid Profile: No results for input(s): CHOL, HDL, LDLCALC, TRIG, CHOLHDL, LDLDIRECT in the last 72 hours. Thyroid Function Tests: No results for input(s): TSH, T4TOTAL, FREET4, T3FREE, THYROIDAB in the last 72 hours. Anemia Panel: No results for input(s): VITAMINB12, FOLATE, FERRITIN, TIBC, IRON, RETICCTPCT in the last 72 hours. Sepsis Labs: No results for input(s): PROCALCITON, LATICACIDVEN in the last 168 hours.  No results found for this or any previous visit (from the past 240 hour(s)).       Radiology Studies: No results found.      Scheduled Meds:  amLODipine  10 mg Oral Daily   calcitRIOL  1.5 mcg Oral Q M,W,F-HD   chlorhexidine  15 mL Mouth Rinse BID   Chlorhexidine Gluconate Cloth  6 each Topical Q0600   collagenase   Topical Daily   darbepoetin (ARANESP) injection - DIALYSIS  150 mcg Intravenous Q Wed-HD   doxycycline  100 mg Oral Q12H   feeding supplement (NEPRO CARB STEADY)  237 mL Oral BID BM   insulin aspart  0-5 Units Subcutaneous QHS   insulin aspart  0-9 Units Subcutaneous TID WC   mouth rinse  15 mL Mouth Rinse q12n4p   multivitamin  1 tablet Oral QHS   pantoprazole  40 mg Oral Daily   QUEtiapine  50 mg Oral BID   thiamine  100 mg Oral Daily   zinc sulfate  220 mg Oral Daily   Continuous Infusions:   LOS: 23 days     Georgette Shell, MD 03/14/2020, 12:48 PM

## 2020-03-14 NOTE — Progress Notes (Signed)
The patient was noted to have intentionally sliding out of the "low bed" that he was in and found sitting beside of his bed on the floor.  There are no injuries noted.The patient has been continuously educated in regards to fall safety and asking for assist.  The patient is known noncompliant with instructions or care.The patient has had previous fall history with this admission and had been moved to closer to the nurse's desk for closer observation. Safety mats were in place on the floor.  Yellow fall stockings in place.  The unit secretary walked into the room after hearing the patient say "nurse".  The primary nurse was sitting close, within visual distance but reports that the bed alarm was not turned on after putting the patient back to bed from recliner.  Deborah Chalk, Unit Director was notified via phone.  A bed huddle was gathered and discussed with day and night shift staff for those involved in the incident.  The Primary Nurse will call the family and the Primary MD

## 2020-03-14 NOTE — Progress Notes (Signed)
Found Pt sitting on the floor, leaning on bed after sliding out of bed. Pt refused for his vital signs to be taken. Pt assisted to bed, denies pain 0/10, denies hitting his head and states he slid out of bed.   Pt had been noncompliant with instructions of care. Pt on low bed, mats in place. Pt had a sitter from 1100-1500, [facility] contacted regarding sitter need. Staff was told no person was available.  At [place], [facility] notified again regarding sitter need, Pt has a sitter at this time. MD and daughter [name] updated and notified.  Endorsed accordingly to oncoming RN.

## 2020-03-14 NOTE — Plan of Care (Addendum)
RN able to cleanse and apply santyl to sacral wound but Pt refused wet to dry dressing and mepilex.   Problem: Clinical Measurements: Goal: Ability to maintain clinical measurements within normal limits will improve Outcome: Progressing   Problem: Activity: Goal: Risk for activity intolerance will decrease Outcome: Progressing   Problem: Nutrition: Goal: Adequate nutrition will be maintained Outcome: Progressing   Problem: Coping: Goal: Level of anxiety will decrease Outcome: Progressing   Problem: Elimination: Goal: Will not experience complications related to bowel motility Outcome: Progressing   Problem: Pain Managment: Goal: General experience of comfort will improve Outcome: Progressing   Problem: Safety: Goal: Ability to remain free from injury will improve Outcome: Progressing   Problem: Skin Integrity: Goal: Risk for impaired skin integrity will decrease Outcome: Progressing

## 2020-03-14 NOTE — Progress Notes (Signed)
Patient seen in dialysis unit. He requested to terminate the treatment about 20 minutes after starting claiming he was cold. Refused nursing attempts to warm him. I explained to him consequences of stopping dialysis, including death. He tells me he understands and wants to stop treatment. Tried to clarify if he wants to stop dialysis completely and go to hospice. He says "I'll go to hospice just get me off". He alternates with "I go to another hospital"  It's not clear to me that he fully understands the consequences of his actions.   He later attempted to pull the dialysis needles out himself, therefore unsafe to continue treatment today. Attempted to phone daughter Joseph Art but she did not answer and mailbox was full. Will notify primary MD as well.    Lynnda Child PA-C [facility] 03/14/2020,2:49 PM

## 2020-03-14 NOTE — Progress Notes (Signed)
Remaining 3:17mins pt wants to terminate HD and trying to remove his needle. PA informed and talked to pt but pt still insisting to continue HD.

## 2020-03-15 DIAGNOSIS — G9341 Metabolic encephalopathy: Secondary | ICD-10-CM | POA: Diagnosis not present

## 2020-03-15 DIAGNOSIS — E875 Hyperkalemia: Secondary | ICD-10-CM | POA: Diagnosis not present

## 2020-03-15 DIAGNOSIS — I10 Essential (primary) hypertension: Secondary | ICD-10-CM

## 2020-03-15 DIAGNOSIS — N186 End stage renal disease: Secondary | ICD-10-CM | POA: Diagnosis not present

## 2020-03-15 LAB — GLUCOSE, CAPILLARY
Glucose-Capillary: 94 mg/dL (ref 70–99)
Glucose-Capillary: 117 mg/dL — ABNORMAL HIGH (ref 70–99)
Glucose-Capillary: 123 mg/dL — ABNORMAL HIGH (ref 70–99)
Glucose-Capillary: 109 mg/dL — ABNORMAL HIGH (ref 70–99)

## 2020-03-15 NOTE — Progress Notes (Signed)
[facility] Progress Note  Subjective:  Did not cooperate with dialysis yesterday - asked to sign off early in treatment then tried to pull needles himself. He remembers all of yesterday's events. He knows he's in [facility] hospital. Doesn't want to dialyze in hospital. Tells me he doesn't know if he will go to dialysis today.   Vitals:   03/14/20 1438 03/14/20 1530 03/15/20 0834 03/15/20 0836  BP: (!) 176/90   (!) 157/73  Pulse: 81 78 79 84  Resp: 18 15 17 14   Temp: 98.5 F (36.9 C) 98.4 F (36.9 C)    TempSrc: Axillary Oral    SpO2: 100% 100% 99%   Weight:      Height:        Exam:  alert, nad   no jvd  Chest cta bilat  Cor reg no RG  Abd soft ntnd no ascites   Ext no LE edema   Alert, NF, ox3  +TDC/ L AVF+bruit     CXR 9/23 - patchy perihilar/ basilar dz, no edema      OP HD: [facility] MWF   4h 45min  87kg  2/2 bath  TDC / L AVF  Hep none  -Mircera 150 mcg IV q 2 weeks (last dose 02/09/2020) -Venofer 100 mg IV X 8 doses (not started yet) -Calcitriol 1.5 mcg PO TIW  Assessment/ Plan: 1. COVID PNA/ HCAP (EColi/ MSSA resp culture): Tested positive at [facility] 09/18 and again here. Got IV cefazolin thru 10/1. Remdesivir stopped 9/22 d/t ^LFT's.  COVID isolation ended 03/12/20.  2. Acute hypoxic resp failure.  At one point required mechanical ventilation. Resolved.  3. AMS - Mentation better but he continues to refuse treatments, including dialysis. Seen by psychiatry 03/12/20 and refused to discuss anything with them. Psychiatry suggesting guardianship. Per pmd.  4. Recent epidural abscess/MRSA cervical discitis-  S/p decompression surgery C4-T2 on 01/06/20 during prior admission. Pt admitted this time on 9/19 w/ AMS.  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  NSurg recommended no further surgery. Plans w/ ID were for Vancomycin and Rifampin here through 10/01 but rifampin dc'd early due to ^LFT"s. PO doxy started on 10/01 and pt is now off IV vanc.  Will f/u w/ ID clinic in 3-4 wks, they will guide on the stop date of the po doxy. 5.  ESRD - MWF HD. HIT testing was negative. OK to use heparin to block catheter now. Last HD Sat 10/9. Refused dialysis Monday. Essentially refused to dialyze Tuesday and has not agreed to dialyze today 10/13. Will attempt this afternoon.  6.  HD access - L BC AVF placed Feb 2021. Was used starting in May and had infiltration in Aug prompting [facility] placement to rest the AVF. Have used AVF w/o issues on last 3 HD sessions. TDC would be ready for removal, but due to lack of good IV access have kept in for now.  7.  Hypertension/volume - Previously on high dose midodrine here and this is now off.  Keep even on HD, has not tolerated UF here lately.  8. Anemia  - aranesp increased to 150 mcg for 9/29 onward for now. Hold OP Fe load.  s/p PRBC's 9/29 and may need again soon  9.  Metabolic bone disease - off renvela due to hypophosphatemia and improved last check.  Anticipate need to resume at a lower dose per trends. on calcitriol - corrected Ca ok 10.  DM-per primary 11.  Low  plts - 2nd level testing for HIT was negative.  18   H/o HCV/ liver cirrhosis - hep C testing here was negative. Per pmd will need GI f/u in OP setting.  13.   Loose stools/ sacral wound - flexiseal in place now 14.  Dispo - awaiting SNF placement. Has been refusing to dialyzed this week. Have informed primary.  Nephrology is recommending palliative care consult with family involvement.   Lynnda Child PA-C [facility] 03/15/2020,10:27 AM   Recent Labs  Lab 03/10/20 1109 03/11/20 0233 03/12/20 1747 03/14/20 1500  K 4.2  --   --  4.5  BUN 35*  --   --  38*  CREATININE 8.71*  --   --  10.40*  CALCIUM 7.5*  --   --  7.7*  PHOS 4.9*  --   --  4.9*  HGB 7.9*   < > 8.4* 7.7*   < > = values in this interval not displayed.   Inpatient medications: . amLODipine  10 mg Oral Daily  . calcitRIOL  1.5 mcg Oral Q M,W,F-HD  .  chlorhexidine  15 mL Mouth Rinse BID  . Chlorhexidine Gluconate Cloth  6 each Topical Q0600  . collagenase   Topical Daily  . darbepoetin (ARANESP) injection - DIALYSIS  150 mcg Intravenous Q Wed-HD  . doxycycline  100 mg Oral Q12H  . feeding supplement (NEPRO CARB STEADY)  237 mL Oral BID BM  . insulin aspart  0-5 Units Subcutaneous QHS  . insulin aspart  0-9 Units Subcutaneous TID WC  . mouth rinse  15 mL Mouth Rinse q12n4p  . multivitamin  1 tablet Oral QHS  . pantoprazole  40 mg Oral Daily  . QUEtiapine  50 mg Oral BID  . thiamine  100 mg Oral Daily  . zinc sulfate  220 mg Oral Daily

## 2020-03-15 NOTE — Plan of Care (Signed)
  Problem: Nutrition: Goal: Adequate nutrition will be maintained Outcome: Progressing   Problem: Education: Goal: Knowledge of General Education information will improve Description: Including pain rating scale, medication(s)/side effects and non-pharmacologic comfort measures Outcome: Not Progressing   Problem: Health Behavior/Discharge Planning: Goal: Ability to manage health-related needs will improve Outcome: Not Progressing   Problem: Activity: Goal: Risk for activity intolerance will decrease Outcome: Not Progressing

## 2020-03-15 NOTE — Progress Notes (Signed)
This afternoon this RN witnessed pt turn off his bed alarm. Pt was educated to not touch bed alarm, but he is able to scoot to the end of the bed and turn off alarm sound in room. Bed alarm was turned on, remote to low bed with sensor controls was placed under foot of bed so pt could not reach it but it would still remain accessible to the staff members. CN, NT, and NS were made aware of this situation. Will continue to monitor.

## 2020-03-15 NOTE — Progress Notes (Signed)
Occupational Therapy Treatment Patient Details Name: [name]. MRN: [id number] DOB: January 20, 1951 Today's Date: 03/15/2020    History of present illness Pt is a 69 y.o. male with d/c to SNF after prolonged complicated admission s/p C4-T2 laminectomy fusion (01/06/20), now admitted from [facility] 02/20/20 with confusion. Workup for metabolic encephalopathy, shock hypoxia; (+) COVID-19. 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 ETT 9/22-9/25. PMH includes HTN, HLD, ESRD (HD MWF), colon CA s/p resection. Had fall during hospitalization 10/7.   OT comments  Pt initially pleasant and motivated to participate in EOB activities with OT. However, once lunch arrived, pt with sustained focus on eating his food. OT attempted to assist pt in repositioning in bed to safely eat with pt becoming agitated and told OT to "just give me my food and go the hell on.". Pt's daughter entering at end of session. Pt still verbally aggressive towards OT. Minimal progress towards OT goals. Main barrier to progress is cognition and behaviors.    Follow Up Recommendations  SNF;Supervision/Assistance - 24 hour    Equipment Recommendations  Other (comment) (To be decided)    Recommendations for Other Services      Precautions / Restrictions Precautions Precautions: Fall Precaution Comments: AMS, agitation, impulsive Restrictions Weight Bearing Restrictions: No       Mobility Bed Mobility Overal bed mobility: Needs Assistance             General bed mobility comments: Able to scoot up in bed using B feet without assistance  Transfers                      Balance                                           ADL either performed or assessed with clinical judgement   ADL Overall ADL's : Needs assistance/impaired Eating/Feeding: Set up;Bed level Eating/Feeding Details (indicate cue type and reason): Setup, requires cues for  repositioning for safe eating. poor awareness and problem solving. Adamantly ignoring OT suggestion to scoot up in bed as HOB elevated already. Then once set up for tray as pt aggresively demanded, reports "can you move this table so I can sit up better?" Grooming: Supervision/safety;Bed level;Wash/dry face Grooming Details (indicate cue type and reason): partially washed face then threw washcloth on floor. Initially motivated to sit EOB for grooming tasks but once lunch arrived, became agitated, rude and difficult to redirect                               General ADL Comments: Increasing agitation, poor safety awareness     Vision   Vision Assessment?: No apparent visual deficits   Perception     Praxis      Cognition Arousal/Alertness: Awake/alert Behavior During Therapy: Restless;Agitated;Impulsive Overall Cognitive Status: Impaired/Different from baseline Area of Impairment: Attention;Memory;Following commands;Safety/judgement;Awareness;Problem solving;Orientation                 Orientation Level: Disoriented to;Situation;Time Current Attention Level: Focused Memory: Decreased short-term memory Following Commands: Follows one step commands with increased time;Follows one step commands inconsistently Safety/Judgement: Decreased awareness of safety;Decreased awareness of deficits Awareness: Intellectual Problem Solving: Slow processing;Difficulty sequencing;Requires verbal cues;Requires tactile cues General Comments: Pt initially pleasant and agreeable to participate in session then became  agitated and rude that OT was not setting him up fast enough for his lunch that arrived mid session. Poor safety awareness, very impulsive        Exercises     Shoulder Instructions       General Comments Daughter entering at end of session     Pertinent Vitals/ Pain       Pain Assessment: No/denies pain  Home Living                                           Prior Functioning/Environment              Frequency  Min 2X/week        Progress Toward Goals  OT Goals(current goals can now be found in the care plan section)  Progress towards OT goals: Not progressing toward goals - comment  Acute Rehab OT Goals Patient Stated Goal: wants to eat food OT Goal Formulation: With patient Time For Goal Achievement: 03/21/20 Potential to Achieve Goals: Fair ADL Goals Pt Will Perform Eating: with set-up;with supervision;sitting;bed level Pt Will Perform Grooming: with set-up;with supervision;sitting;bed level Additional ADL Goal #1: Pt will perform bed mobility with Min A in preparation for ADLs Additional ADL Goal #2: Pt will follow one step commands during ADLS 12% of time with Min cues Additional ADL Goal #3: Pt will maintain sitting balance at EOB with Min Guard A for ~10 minutes in preparation for ADLs  Plan Discharge plan remains appropriate    Co-evaluation                 AM-PAC OT "6 Clicks" Daily Activity     Outcome Measure   Help from another person eating meals?: A Little Help from another person taking care of personal grooming?: A Little Help from another person toileting, which includes using toliet, bedpan, or urinal?: Total Help from another person bathing (including washing, rinsing, drying)?: A Lot Help from another person to put on and taking off regular upper body clothing?: A Lot Help from another person to put on and taking off regular lower body clothing?: Total 6 Click Score: 12    End of Session    OT Visit Diagnosis: Unsteadiness on feet (R26.81);Other abnormalities of gait and mobility (R26.89);Muscle weakness (generalized) (M62.81)   Activity Tolerance Treatment limited secondary to agitation   Patient Left in bed;with call bell/phone within reach;with bed alarm set;with family/visitor present;Other (comment) (fall mats)   Nurse Communication Mobility status;Other (comment)  (behaviors, RN in at end of session)        Time: [phone] OT Time Calculation (min): 20 min  Charges: OT General Charges $OT Visit: 1 Visit OT Treatments $Self Care/Home Management : 8-22 mins  Layla Maw, OTR/L   Layla Maw 03/15/2020, 12:20 PM

## 2020-03-15 NOTE — Plan of Care (Signed)
  Problem: Education: Goal: Knowledge of General Education information will improve Description: Including pain rating scale, medication(s)/side effects and non-pharmacologic comfort measures Outcome: Progressing   Problem: Health Behavior/Discharge Planning: Goal: Ability to manage health-related needs will improve Outcome: Progressing   Problem: Clinical Measurements: Goal: Ability to maintain clinical measurements within normal limits will improve Outcome: Progressing Goal: Will remain free from infection Outcome: Progressing Goal: Diagnostic test results will improve Outcome: Progressing Goal: Respiratory complications will improve Outcome: Progressing Goal: Cardiovascular complication will be avoided Outcome: Progressing   Problem: Activity: Goal: Risk for activity intolerance will decrease Outcome: Progressing   Problem: Nutrition: Goal: Adequate nutrition will be maintained Outcome: Progressing   Problem: Coping: Goal: Level of anxiety will decrease Outcome: Progressing   Problem: Elimination: Goal: Will not experience complications related to bowel motility Outcome: Progressing Goal: Will not experience complications related to urinary retention Outcome: Progressing   Problem: Pain Managment: Goal: General experience of comfort will improve Outcome: Progressing   Problem: Safety: Goal: Ability to remain free from injury will improve Outcome: Progressing   Problem: Skin Integrity: Goal: Risk for impaired skin integrity will decrease Outcome: Progressing   Problem: Education: Goal: Knowledge of risk factors and measures for prevention of condition will improve Outcome: Progressing   Problem: Coping: Goal: Psychosocial and spiritual needs will be supported Outcome: Progressing   Problem: Respiratory: Goal: Will maintain a patent airway Outcome: Progressing Goal: Complications related to the disease process, condition or treatment will be avoided or  minimized Outcome: Progressing   Problem: Education: Goal: Knowledge of disease and its progression will improve Outcome: Progressing Goal: Individualized Educational Video(s) Outcome: Progressing   Problem: Fluid Volume: Goal: Compliance with measures to maintain balanced fluid volume will improve Outcome: Progressing   Problem: Health Behavior/Discharge Planning: Goal: Ability to manage health-related needs will improve Outcome: Progressing   Problem: Nutritional: Goal: Ability to make healthy dietary choices will improve Outcome: Progressing   Problem: Clinical Measurements: Goal: Complications related to the disease process, condition or treatment will be avoided or minimized Outcome: Progressing

## 2020-03-15 NOTE — Progress Notes (Signed)
PROGRESS NOTE    [name].  WUJ:[id number] DOB: 1951/05/23 DOA: 02/20/2020 PCP: [facility]   Brief Narrative: [name]. is a 69 y.o. malewith PMHx of ESRD on HD MWF, DM-2, MRSA bacteremia with epidural abscess s/p C4-T2 laminectomy/fusion on 01/06/2020-on IV vancomycin/rifampin through 10/1-resident of SNF-apparently tested positive for COVID-19 on 9/18-subsequently brought to the ED on 9/19 for worsening confusion (more than baseline)-fever-thought to have acute metabolic encephalopathy from COVID-19 infection and admitted to the hospitalist service. Further hospital course complicated by development of hypotension/severe encephalopathy requiring intubation and ICU transfer.   Significant Events: 8/3-8/23>>hospitalized for epidural abscess/MRSA bacteremia-underwent laminectomy-briefly intubated due to altered mental status. Discharged to SNF (prior admission) 9/18>> COVID-19 positive at SNF 9/19>> Admit to [facility] for fever/confusion 9/22>> obtunded/hypotensive after coming back from hemodialysis-emergently intubated/transferred to ICU 9/30>> transferred back to [facility] 9/30>> SQ heparin stopped-pharmacy consulted for HIT protocol.  Significant studies: 8/6>> Echo: EF> 75% 9/19>>Chest x-ray: No acute abnormality in the lung. 9/19>> CT head: No acute intracranial abnormality 9/19>> CT C-spine: Postoperative findings of posterior laminectomy/fusion C4-T1, prevertebral soft tissue edema appears reduced, postoperative fluid collection overlies laminectomy site does not appear significantly changed compared to prior examination. 9/20>> MRI C-spine: Attempted but due to AMS-not completed 9/21>> MRI C Spine: Osteomyelitis of skull base, anterior C1 and odontoid, C2-C3 facet joint-associated erosion of bone since 8/27-resolved epidural abscess 9/22>> coarsened/heterogeneous appearance of the liver parenchyma-suspicious for cirrhosis, gallbladder not  visualized. 9/22>> bilateral lower extremity Doppler: No DVT 9/29>> chest x-ray: Bibasilar infiltrates suggesting multifocal pneumonia   Assessment & Plan:   Principal Problem:   Acute metabolic encephalopathy Active Problems:   Hyperlipidemia   Hyperkalemia   HYPERTENSION   Anemia of chronic disease   Type 2 diabetes mellitus with renal complication (HCC)   ESRD (end stage renal disease) (HCC)   Epidural abscess   COVID-19   Hx of bacteremia   Goals of care, counseling/discussion   Palliative care by specialist   DNR (do not resuscitate) discussion   Acute respiratory failure with hypoxia Secondary to below. Patient required intubation and mechanical ventilation and was extubated on 9/26. Currently on room air.  COVID-19 pneumonia HCAP Sputum culture significant for MSSA and E. Coli. He was treated with Cefazolin for bacterial pneumonia in addition to Remdesivir. Remdesivir treatment was discontinued secondary to significant rise in LFTs. Patient has completed quarantine period.  Acute metabolic encephalopathy Likely multifactorial in setting of uremia vs sepsis. Appears to be resolved.  Osteomyelitis of skull base Neurosurgery consulted with no recommendations for surgical management at this time. Patient was managed on Vancomycin and Rifampin, however this was switched to doxycycline secondary to elevated LFTs. Currently on Doxycycline with recommendations for follow-up at the ID clinic 3-4 weeks after discharge in addition to neurosurgery follow-up. -Continue Doxycycline  Septic shock In setting of pneumonia. Required vasopressor support in the ICU which is now resolved.  Dysphagia -Continue dysphagia 3 diet  Thrombocytopenia Mild. Stable.  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 Patient is refusing HD. Had discussion with patient for which he  states he does not like having HD at [facility] secondary to the HD unit being cold. He understands that not having HD will likely lead to his death and instead will wait until he can discharge to resume HD.  Essential hypertension -Continue amlodipine  Hyperlipidemia Patient is on Lipitor as an  outpatient which was held secondary to elevated LFTs this admission.  Diabetes mellitus, type 2 Hemoglobin A1C of 6.5%. Patient is on Levemir 11 units BID as an outpatient. -Continue SSI  Pressure injury Right/left buttock, not present on admission   DVT prophylaxis: SCDs Code Status:   Code Status: Full Code Family Communication: None at bedside. Patient declined for me to be able to speak with his daughter Disposition Plan: Discharge to SNF when bed available and pending goals of care discussions since patient is currently declining HD   Consultants:   PCCM  Nephrology  Neurosurgery  Palliative care medicine  Procedures:     Antimicrobials:      Subjective: Patient without concern today. Does not want HD in the hospital because the HD unit is cold. Will not entertain possible solutions to alleviate that issue. States he will do HD as an outpatient.  Objective: Vitals:   03/14/20 1438 03/14/20 1530 03/15/20 0834 03/15/20 0836  BP: (!) 176/90   (!) 157/73  Pulse: 81 78 79 84  Resp: 18 15 17 14   Temp: 98.5 F (36.9 C) 98.4 F (36.9 C)    TempSrc: Axillary Oral    SpO2: 100% 100% 99%   Weight:      Height:        Intake/Output Summary (Last 24 hours) at 03/15/2020 1835 Last data filed at 03/15/2020 0100 Gross per 24 hour  Intake 120 ml  Output 150 ml  Net -30 ml   Filed Weights   03/10/20 0500 03/11/20 1356 03/14/20 1347  Weight: 85.3 kg 85.3 kg 82.7 kg    Examination:  General exam: Appears calm and comfortable  Patient refused examination.   Data Reviewed: I have personally reviewed following labs and imaging studies  CBC Lab Results  Component Value  Date   WBC 4.7 03/14/2020   RBC 2.88 (L) 03/14/2020   HGB 7.7 (L) 03/14/2020   HCT 24.4 (L) 03/14/2020   MCV 84.7 03/14/2020   MCH 26.7 03/14/2020   PLT 146 (L) 03/14/2020   MCHC 31.6 03/14/2020   RDW 18.9 (H) 03/14/2020   LYMPHSABS 1.0 02/24/2020   MONOABS 0.2 02/24/2020   EOSABS 0.0 02/24/2020   BASOSABS 0.0 65/46/5035     Last metabolic panel Lab Results  Component Value Date   NA 135 03/14/2020   K 4.5 03/14/2020   CL 107 03/14/2020   CO2 19 (L) 03/14/2020   BUN 38 (H) 03/14/2020   CREATININE 10.40 (H) 03/14/2020   GLUCOSE 134 (H) 03/14/2020   GFRNONAA 5 (L) 03/14/2020   GFRAA 9 (L) 03/05/2020   CALCIUM 7.7 (L) 03/14/2020   PHOS 4.9 (H) 03/14/2020   PROT 6.1 (L) 03/05/2020   ALBUMIN 1.9 (L) 03/14/2020   BILITOT 1.3 (H) 03/05/2020   ALKPHOS 93 03/05/2020   AST 30 03/05/2020   ALT 9 03/05/2020   ANIONGAP 9 03/14/2020    CBG (last 3)  Recent Labs    03/15/20 0823 03/15/20 1241 03/15/20 1745  GLUCAP 94 117* 123*     GFR: Estimated Creatinine Clearance: 7.5 mL/min (A) (by C-G formula based on SCr of 10.4 mg/dL (H)).  Coagulation Profile: No results for input(s): INR, PROTIME in the last 168 hours.  No results found for this or any previous visit (from the past 240 hour(s)).      Radiology Studies: No results found.      Scheduled Meds: . amLODipine  10 mg Oral Daily  . calcitRIOL  1.5 mcg Oral Q M,W,F-HD  .  chlorhexidine  15 mL Mouth Rinse BID  . Chlorhexidine Gluconate Cloth  6 each Topical Q0600  . collagenase   Topical Daily  . darbepoetin (ARANESP) injection - DIALYSIS  150 mcg Intravenous Q Wed-HD  . doxycycline  100 mg Oral Q12H  . feeding supplement (NEPRO CARB STEADY)  237 mL Oral BID BM  . insulin aspart  0-5 Units Subcutaneous QHS  . insulin aspart  0-9 Units Subcutaneous TID WC  . mouth rinse  15 mL Mouth Rinse q12n4p  . multivitamin  1 tablet Oral QHS  . pantoprazole  40 mg Oral Daily  . QUEtiapine  50 mg Oral BID  .  thiamine  100 mg Oral Daily  . zinc sulfate  220 mg Oral Daily   Continuous Infusions:   LOS: 24 days     Cordelia Poche, MD [facility] 03/15/2020, 6:35 PM  If 7PM-7AM, please contact night-coverage [email or web address]

## 2020-03-15 NOTE — TOC Progression Note (Signed)
Transition of Care (TOC) - Progression Note    Patient Details  Name: Corby Apelles Adline Mango. MRN: [id number] Date of Birth: Oct 18, 1950  Transition of Care [facility]) CM/SW [place], [place] Phone Number: 03/15/2020, 3:14 PM  Clinical Narrative:     Pt continues to refuse HD treatments, CSW will wait on results from palliative consult. [place] unable to accept pt back IF he continues to refuse HD.   Expected Discharge Plan: [place] Barriers to Discharge: Continued Medical Work up  Expected Discharge Plan and Services Expected Discharge Plan: [place] In-house Referral: Clinical Social Work Discharge Planning Services: CM Consult Post Acute Care Choice: Durable Medical Equipment, Home Health Living arrangements for the past 2 months: Apartment                                       Social Determinants of Health (SDOH) Interventions    Readmission Risk Interventions Readmission Risk Prevention Plan 02/22/2020 01/18/2020  Transportation Screening Complete Complete  Medication Review Press photographer) Referral to Pharmacy Complete  PCP or Specialist appointment within 3-5 days of discharge Complete Complete  HRI or [place] Complete Complete  SW Recovery Care/Counseling Consult Complete Complete  Palliative Care Screening Not Applicable Complete  [place] Patient Refused Complete  Some recent data might be hidden   Emeterio Reeve, [place], [place] Social Worker [phone]

## 2020-03-16 DIAGNOSIS — N186 End stage renal disease: Secondary | ICD-10-CM | POA: Diagnosis not present

## 2020-03-16 DIAGNOSIS — Z87898 Personal history of other specified conditions: Secondary | ICD-10-CM

## 2020-03-16 DIAGNOSIS — Z515 Encounter for palliative care: Secondary | ICD-10-CM

## 2020-03-16 DIAGNOSIS — Z7189 Other specified counseling: Secondary | ICD-10-CM

## 2020-03-16 DIAGNOSIS — I10 Essential (primary) hypertension: Secondary | ICD-10-CM | POA: Diagnosis not present

## 2020-03-16 DIAGNOSIS — G9341 Metabolic encephalopathy: Secondary | ICD-10-CM | POA: Diagnosis not present

## 2020-03-16 DIAGNOSIS — E875 Hyperkalemia: Secondary | ICD-10-CM | POA: Diagnosis not present

## 2020-03-16 LAB — GLUCOSE, CAPILLARY: Glucose-Capillary: 155 mg/dL — ABNORMAL HIGH (ref 70–99)

## 2020-03-16 NOTE — Progress Notes (Signed)
Physical Therapy Treatment Patient Details Name: [name] [name]. MRN: [id number] DOB: September 16, 1950 Today's Date: 03/16/2020    History of Present Illness Pt is a 69 y.o. male with d/c to SNF after prolonged complicated admission s/p C4-T2 laminectomy fusion (01/06/20), now admitted from [facility] 02/20/20 with confusion. Workup for metabolic encephalopathy, shock hypoxia; (+) COVID-19. 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 ETT 9/22-9/25. PMH includes HTN, HLD, ESRD (HD MWF), colon CA s/p resection. Had fall during hospitalization 10/7.    PT Comments    Patient pleasantly confused in bed on arrival. Patient became slightly agitated when asked to participate with therapy. With encouragement, patient agreeable to PT. Initially placed RW in front of patient, however patient refused and pushed RW away. Patient performed sit to stand x3 with modAx2 and HHAx2. Patient needs minAx2 for bed mobility. Patient continues to be limited by impaired cognition, generalized weakness, impaired balance, and impaired functional mobility. Continue to recommend SNF for ongoing Physical Therapy.     Follow Up Recommendations  SNF;Supervision/Assistance - 24 hour     Equipment Recommendations  None recommended by PT    Recommendations for Other Services       Precautions / Restrictions Precautions Precautions: Fall Precaution Comments: AMS, agitation, impulsive Restrictions Weight Bearing Restrictions: No    Mobility  Bed Mobility Overal bed mobility: Needs Assistance Bed Mobility: Supine to Sit;Sit to Supine     Supine to sit: Min assist;+2 for physical assistance;HOB elevated Sit to supine: Min assist;+2 for physical assistance;HOB elevated      Transfers Overall transfer level: Needs assistance Equipment used: 2 person hand held assist Transfers: Sit to/from Stand Sit to Stand: Mod assist;+2 physical assistance         General transfer  comment: Pt performed sit to stand x 3 but required modAx2; pt became easily agitated with subsequent stands.   Ambulation/Gait                 Stairs             Wheelchair Mobility    Modified Rankin (Stroke Patients Only)       Balance Overall balance assessment: Needs assistance Sitting-balance support: Feet supported Sitting balance-Leahy Scale: Fair Sitting balance - Comments: sitting EOB with min guard to close supervision   Standing balance support: Bilateral upper extremity supported Standing balance-Leahy Scale: Poor Standing balance comment: minA for static standing balance                            Cognition Arousal/Alertness: Awake/alert Behavior During Therapy: Restless;Agitated;Impulsive Overall Cognitive Status: Impaired/Different from baseline Area of Impairment: Attention;Memory;Following commands;Safety/judgement;Awareness;Problem solving;Orientation                 Orientation Level: Disoriented to;Situation;Time Current Attention Level: Focused Memory: Decreased short-term memory Following Commands: Follows one step commands with increased time;Follows one step commands inconsistently Safety/Judgement: Decreased awareness of safety;Decreased awareness of deficits Awareness: Intellectual Problem Solving: Slow processing;Difficulty sequencing;Requires verbal cues;Requires tactile cues General Comments: Pt did not want to participate, slightly agitated. With encouragement, patient agreed to participate.       Exercises General Exercises - Upper Extremity Shoulder Flexion: AROM;Both;5 reps;Supine General Exercises - Lower Extremity Heel Slides: AROM;Both;5 reps;Supine    General Comments        Pertinent Vitals/Pain Pain Assessment: No/denies pain    Home Living  Prior Function            PT Goals (current goals can now be found in the care plan section) Acute Rehab PT Goals PT  Goal Formulation: Patient unable to participate in goal setting Time For Goal Achievement: 03/30/20 Potential to Achieve Goals: Fair Progress towards PT goals: Not progressing toward goals - comment    Frequency    Min 2X/week      PT Plan Current plan remains appropriate    Co-evaluation              AM-PAC PT "6 Clicks" Mobility   Outcome Measure  Help needed turning from your back to your side while in a flat bed without using bedrails?: A Little Help needed moving from lying on your back to sitting on the side of a flat bed without using bedrails?: A Little Help needed moving to and from a bed to a chair (including a wheelchair)?: Total Help needed standing up from a chair using your arms (e.g., wheelchair or bedside chair)?: A Lot Help needed to walk in hospital room?: Total Help needed climbing 3-5 steps with a railing? : Total 6 Click Score: 11    End of Session Equipment Utilized During Treatment: Gait belt Activity Tolerance: Treatment limited secondary to agitation Patient left: in bed;with bed alarm set;with call bell/phone within reach;with nursing/sitter in room (bed at lowest height; fall mats x 2) Nurse Communication: Mobility status PT Visit Diagnosis: Other abnormalities of gait and mobility (R26.89);Muscle weakness (generalized) (M62.81);Other symptoms and signs involving the nervous system (R29.898)     Time: 1093-2355 PT Time Calculation (min) (ACUTE ONLY): 12 min  Charges:  $Therapeutic Activity: 8-22 mins                     Perrin Maltese, PT, DPT Acute Rehabilitation Services Pager [phone] Office [phone]    [name] 03/16/2020, 2:51 PM

## 2020-03-16 NOTE — Progress Notes (Signed)
[facility] Progress Note  Subjective:  Seen and examined in room. He continues to refuse dialysis. Discussed with him that dialysis is keeping him alive and refusing will result in death. He says he understands and asks me to leave when I question him further about refusal.  Called his daughter Joseph Art this morning-she has been visiting him this week and encouraging him to comply with treatments. She says he is at his baseline MS and she also does not understand why he is refusing treatments.   Vitals:   03/14/20 1530 03/15/20 0834 03/15/20 0836 03/15/20 2122  BP:   (!) 157/73 (!) 158/74  Pulse:  79 84 85  Resp:  17 14 18   Temp: 98.4 F (36.9 C)   97.6 F (36.4 C)  TempSrc: Oral   Oral  SpO2: 100% 99%  100%  Weight:      Height:        Exam:  Alert, lying in bed, nad   no jvd  Lungs clear bilateral   Cor reg no RG  Abd soft ntnd no ascites   Ext no LE edema   Alert, NF, ox2 doesn't know date,   +TDC/ L AVF+bruit     CXR 9/23 - patchy perihilar/ basilar dz, no edema      OP HD: [facility] MWF   4h 11min  87kg  2/2 bath  TDC / L AVF  Hep none  -Mircera 150 mcg IV q 2 weeks (last dose 02/09/2020) -Venofer 100 mg IV X 8 doses (not started yet) -Calcitriol 1.5 mcg PO TIW  Assessment/ Plan: 1. COVID PNA/ HCAP (EColi/ MSSA resp culture): Tested positive at [facility] 09/18 and again here. Got IV cefazolin thru 10/1. Remdesivir stopped 9/22 d/t ^LFT's.  COVID isolation ended 03/12/20.  2. Acute hypoxic resp failure.  At one point required mechanical ventilation. Resolved.  3. AMS - [place] better but he continues to refuse treatments, including dialysis. Seen by psychiatry 03/12/20 and refused to discuss anything with them. Psychiatry suggesting guardianship.  4. Recent epidural abscess/MRSA cervical discitis-  S/p decompression surgery C4-T2 on 01/06/20 during prior admission. Pt admitted this time on 9/19 w/ AMS.  MRI 9/21 appeared to show residual osteo of the skull base  area.  NSurg recommended no further surgery. Plans w/ ID were for Vancomycin and Rifampin here through 10/01 but rifampin dc'd early due to ^LFT"s. PO doxy started on 10/01 and pt is now off IV vanc. Will f/u w/ ID clinic in 3-4 wks, they will guide on the stop date of the po doxy. 5.  ESRD - MWF HD. HIT testing was negative. OK to use heparin to block catheter now. Last HD Sat 10/9. Refused dialysis Monday. Essentially refused to dialyze Tuesday (tried to pull needles out in the unit) and continues to refuse dialysis today.  Will check labs today.  6.  HD access - L BC AVF placed Feb 2021. Was used starting in May and had infiltration in Aug prompting [facility] placement to rest the AVF. Have used AVF w/o issues on last 3 HD sessions. TDC would be ready for removal, but due to lack of good IV access have kept in for now.  7.  Hypertension/volume - Previously on high dose midodrine here and this is now off.  Keep even on HD, has not tolerated UF here lately.  8. Anemia  -  Hgb trending down.  Aranesp increased to 150 mcg for 9/29 onward for now. Hold OP Fe load.  s/p PRBC's 9/29  9.  Metabolic bone disease - off renvela due to hypophosphatemia and improved last check.  Anticipate need to resume at a lower dose per trends. on calcitriol - corrected Ca ok 10.  DM-per primary 11.  Low plts - 2nd level testing for HIT was negative. Improved.  35   H/o HCV/ liver cirrhosis - hep C testing here was negative. Per pmd will need GI f/u in OP setting.  27.  Dispo - awaiting SNF placement. Has been refusing dialysis this week. Palliative care has been consulted to determine [place].   Lynnda Child PA-C [facility] 03/16/2020,10:23 AM   Recent Labs  Lab 03/10/20 1109 03/11/20 0233 03/12/20 1747 03/14/20 1500  K 4.2  --   --  4.5  BUN 35*  --   --  38*  CREATININE 8.71*  --   --  10.40*  CALCIUM 7.5*  --   --  7.7*  PHOS 4.9*  --   --  4.9*  HGB 7.9*   < > 8.4* 7.7*   < > = values in this  interval not displayed.   Inpatient medications: . amLODipine  10 mg Oral Daily  . calcitRIOL  1.5 mcg Oral Q M,W,F-HD  . chlorhexidine  15 mL Mouth Rinse BID  . Chlorhexidine Gluconate Cloth  6 each Topical Q0600  . collagenase   Topical Daily  . darbepoetin (ARANESP) injection - DIALYSIS  150 mcg Intravenous Q Wed-HD  . doxycycline  100 mg Oral Q12H  . feeding supplement (NEPRO CARB STEADY)  237 mL Oral BID BM  . insulin aspart  0-5 Units Subcutaneous QHS  . insulin aspart  0-9 Units Subcutaneous TID WC  . mouth rinse  15 mL Mouth Rinse q12n4p  . multivitamin  1 tablet Oral QHS  . pantoprazole  40 mg Oral Daily  . QUEtiapine  50 mg Oral BID  . thiamine  100 mg Oral Daily  . zinc sulfate  220 mg Oral Daily

## 2020-03-16 NOTE — Progress Notes (Signed)
Spoke with daughter, [name] this afternoon. [place] meeting with patient and family tomorrow 10/15 at 11:30am.   [place], [place] FNP-C Palliative Medicine Team  Phone: [phone] Fax: [phone]

## 2020-03-16 NOTE — Plan of Care (Signed)
  Problem: Nutrition: Goal: Adequate nutrition will be maintained Outcome: Progressing   

## 2020-03-16 NOTE — Progress Notes (Addendum)
PROGRESS NOTE    [name].  KCL:[id number] DOB: April 09, 1951 DOA: 02/20/2020 PCP: [facility]   Brief Narrative: [name]. is a 69 y.o. malewith PMHx of ESRD on HD MWF, DM-2, MRSA bacteremia with epidural abscess s/p C4-T2 laminectomy/fusion on 01/06/2020-on IV vancomycin/rifampin through 10/1-resident of SNF-apparently tested positive for COVID-19 on 9/18-subsequently brought to the ED on 9/19 for worsening confusion (more than baseline)-fever-thought to have acute metabolic encephalopathy from COVID-19 infection and admitted to the hospitalist service. Further hospital course complicated by development of hypotension/severe encephalopathy requiring intubation and ICU transfer.   Significant Events: 8/3-8/23>>hospitalized for epidural abscess/MRSA bacteremia-underwent laminectomy-briefly intubated due to altered mental status. Discharged to SNF (prior admission) 9/18>> COVID-19 positive at SNF 9/19>> Admit to [facility] for fever/confusion 9/22>> obtunded/hypotensive after coming back from hemodialysis-emergently intubated/transferred to ICU 9/30>> transferred back to [facility] 9/30>> SQ heparin stopped-pharmacy consulted for HIT protocol.  Significant studies: 8/6>> Echo: EF> 75% 9/19>>Chest x-ray: No acute abnormality in the lung. 9/19>> CT head: No acute intracranial abnormality 9/19>> CT C-spine: Postoperative findings of posterior laminectomy/fusion C4-T1, prevertebral soft tissue edema appears reduced, postoperative fluid collection overlies laminectomy site does not appear significantly changed compared to prior examination. 9/20>> MRI C-spine: Attempted but due to AMS-not completed 9/21>> MRI C Spine: Osteomyelitis of skull base, anterior C1 and odontoid, C2-C3 facet joint-associated erosion of bone since 8/27-resolved epidural abscess 9/22>> coarsened/heterogeneous appearance of the liver parenchyma-suspicious for cirrhosis, gallbladder not  visualized. 9/22>> bilateral lower extremity Doppler: No DVT 9/29>> chest x-ray: Bibasilar infiltrates suggesting multifocal pneumonia   Assessment & Plan:   Principal Problem:   Acute metabolic encephalopathy Active Problems:   Hyperlipidemia   Hyperkalemia   HYPERTENSION   Anemia of chronic disease   Type 2 diabetes mellitus with renal complication (HCC)   ESRD (end stage renal disease) (HCC)   Epidural abscess   COVID-19   Hx of bacteremia   Goals of care, counseling/discussion   Palliative care by specialist   DNR (do not resuscitate) discussion   Acute respiratory failure with hypoxia Secondary to below. Patient required intubation and mechanical ventilation and was extubated on 9/26. Currently on room air.  COVID-19 pneumonia HCAP Sputum culture significant for MSSA and E. Coli. He was treated with Cefazolin for bacterial pneumonia in addition to Remdesivir. Remdesivir treatment was discontinued secondary to significant rise in LFTs. Patient has completed quarantine period.  Acute metabolic encephalopathy Likely multifactorial in setting of uremia vs sepsis. Appears to be resolved.  Osteomyelitis of skull base Neurosurgery consulted with no recommendations for surgical management at this time. Patient was managed on Vancomycin and Rifampin, however this was switched to doxycycline secondary to elevated LFTs. Currently on Doxycycline with recommendations for follow-up at the ID clinic 3-4 weeks after discharge in addition to neurosurgery follow-up. -Continue Doxycycline  Septic shock In setting of pneumonia. Required vasopressor support in the ICU which is now resolved.  Dysphagia -Continue dysphagia 3 diet  Thrombocytopenia Mild. Stable.  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 Patient is refusing HD. Had discussion with patient for which he  states he does not like having HD at [facility] secondary to the HD unit being cold. He understands that not having HD will likely lead to his death and instead will wait until he can discharge to resume HD. -Psychiatry consulted to ensure no treatable behavioral issue contributing to patient's decisions  Essential hypertension -Continue amlodipine  Hyperlipidemia Patient is on Lipitor as an outpatient which was held secondary to elevated LFTs this admission.  Diabetes mellitus, type 2 Hemoglobin A1C of 6.5%. Patient is on Levemir 11 units BID as an outpatient. -Continue SSI  Pressure injury Right/left buttock, not present on admission   DVT prophylaxis: SCDs Code Status:   Code Status: Full Code Family Communication: Daughter and sister on telephone Disposition Plan: Discharge to SNF when bed available and pending goals of care discussions since patient is currently declining HD inpatient   Consultants:   PCCM  Nephrology  Neurosurgery  Palliative care medicine  Procedures:     Antimicrobials:      Subjective: Patient still declining inpatient HD because of it being too cold. Says he will go to outpatient HD. Patient states that he never told me I could not speak with his daughter yesterday.  Objective: Vitals:   03/14/20 1530 03/15/20 0834 03/15/20 0836 03/15/20 2122  BP:   (!) 157/73 (!) 158/74  Pulse:  79 84 85  Resp:  17 14 18   Temp: 98.4 F (36.9 C)   97.6 F (36.4 C)  TempSrc: Oral   Oral  SpO2: 100% 99%  100%  Weight:      Height:        Intake/Output Summary (Last 24 hours) at 03/16/2020 1402 Last data filed at 03/16/2020 0946 Gross per 24 hour  Intake 480 ml  Output --  Net 480 ml   Filed Weights   03/10/20 0500 03/11/20 1356 03/14/20 1347  Weight: 85.3 kg 85.3 kg 82.7 kg    Examination:  General exam: Appears calm and comfortable Respiratory system: Clear to auscultation. Respiratory effort normal. Cardiovascular system: S1 & S2 heard,  RRR. No murmurs, rubs, gallops or clicks. Gastrointestinal system: Abdomen is nondistended, soft and nontender. No organomegaly or masses felt. Normal bowel sounds heard. Central nervous system: Alert and oriented to person and place. No focal neurological deficits. Musculoskeletal: No edema. No calf tenderness Skin: No cyanosis. No rashes Psychiatry: Judgement and insight appear normal. Easily agitated   Data Reviewed: I have personally reviewed following labs and imaging studies  CBC Lab Results  Component Value Date   WBC 4.7 03/14/2020   RBC 2.88 (L) 03/14/2020   HGB 7.7 (L) 03/14/2020   HCT 24.4 (L) 03/14/2020   MCV 84.7 03/14/2020   MCH 26.7 03/14/2020   PLT 146 (L) 03/14/2020   MCHC 31.6 03/14/2020   RDW 18.9 (H) 03/14/2020   LYMPHSABS 1.0 02/24/2020   MONOABS 0.2 02/24/2020   EOSABS 0.0 02/24/2020   BASOSABS 0.0 16/96/7893     Last metabolic panel Lab Results  Component Value Date   NA 135 03/14/2020   K 4.5 03/14/2020   CL 107 03/14/2020   CO2 19 (L) 03/14/2020   BUN 38 (H) 03/14/2020   CREATININE 10.40 (H) 03/14/2020   GLUCOSE 134 (H) 03/14/2020   GFRNONAA 5 (L) 03/14/2020   GFRAA 9 (L) 03/05/2020   CALCIUM 7.7 (L) 03/14/2020   PHOS 4.9 (H) 03/14/2020   PROT 6.1 (L) 03/05/2020   ALBUMIN 1.9 (L) 03/14/2020   BILITOT 1.3 (H) 03/05/2020   ALKPHOS 93 03/05/2020   AST 30 03/05/2020   ALT 9 03/05/2020   ANIONGAP 9 03/14/2020    CBG (last 3)  Recent Labs    03/15/20 1745 03/15/20 2023 03/16/20 1130  GLUCAP 123* 109* 155*     GFR: Estimated Creatinine Clearance: 7.5 mL/min (A) (by C-G formula based  on SCr of 10.4 mg/dL (H)).  Coagulation Profile: No results for input(s): INR, PROTIME in the last 168 hours.  No results found for this or any previous visit (from the past 240 hour(s)).      Radiology Studies: No results found.      Scheduled Meds: . amLODipine  10 mg Oral Daily  . calcitRIOL  1.5 mcg Oral Q M,W,F-HD  . chlorhexidine  15  mL Mouth Rinse BID  . Chlorhexidine Gluconate Cloth  6 each Topical Q0600  . collagenase   Topical Daily  . darbepoetin (ARANESP) injection - DIALYSIS  150 mcg Intravenous Q Wed-HD  . doxycycline  100 mg Oral Q12H  . feeding supplement (NEPRO CARB STEADY)  237 mL Oral BID BM  . insulin aspart  0-5 Units Subcutaneous QHS  . insulin aspart  0-9 Units Subcutaneous TID WC  . mouth rinse  15 mL Mouth Rinse q12n4p  . multivitamin  1 tablet Oral QHS  . pantoprazole  40 mg Oral Daily  . QUEtiapine  50 mg Oral BID  . thiamine  100 mg Oral Daily  . zinc sulfate  220 mg Oral Daily   Continuous Infusions:   LOS: 25 days     Cordelia Poche, MD [facility] 03/16/2020, 2:02 PM  If 7PM-7AM, please contact night-coverage [email or web address]

## 2020-03-16 NOTE — Progress Notes (Signed)
Phlebotomy came to draw labs, she stuck him once but was unable to get blood d/t pt moving his arm because he said the Tourniquet hurts. Refused to be stuck again. RN asked phlebotomy to come back later this evening and try again.

## 2020-03-16 NOTE — Consult Note (Signed)
[facility] Psychiatry Consult   Reason for Consult: Capacity Referring Physician:  Dr Lonny Prude Patient Identification: [name]. MRN:  [id number] Principal Diagnosis: Acute metabolic encephalopathy Diagnosis:  Principal Problem:   Acute metabolic encephalopathy Active Problems:   Hyperlipidemia   Hyperkalemia   HYPERTENSION   Anemia of chronic disease   Type 2 diabetes mellitus with renal complication (HCC)   ESRD (end stage renal disease) (HCC)   Epidural abscess   COVID-19   Hx of bacteremia   Goals of care, counseling/discussion   Palliative care by specialist   DNR (do not resuscitate) discussion   Total Time spent with patient: 45 minutes  Subjective:   [name]. is a 69 y.o. male patient admitted with Covid positive, AMS.  Patient seen and evaluated in person by this provider.  Consult placed for capacity.  He was heartily eating dinner.  Irritable mood.  Asked about his reason for not going to dialysis, "I'm not going dow there, it's too cold.  I don't care if they get me blankets."  Asked if he knew what would happen if he does not get dialysis and he states, "I will go somewhere else for dialysis. I don't want to talk about it.  Get out!"  He does not have capacity to make medical decisions as he cannot express the ramifications of not getting dialysis.  HPI:  69 y.o. male with PMHx of ESRD on HD MWF, DM-2, MRSA bacteremia with epidural abscess s/p C4-T2 laminectomy/fusion on 01/06/2020-on IV vancomycin/rifampin through 10/1-resident of SNF admitted to the hospital due altered mental status and positive for COVID-19. Psychiatric consult was called because patient has been refusing treatment, lab draws and aggressive towards the staff. Today, patient is alert and awake but refused to cooperate with psychiatric evaluation. He is verbally aggressive and says that:''it is an insult that you people think I am crazy, but get out of my room anyway,  I have the right to refuse treatment and you cannot force me.''  Past Psychiatric History: patient refused to answer  Risk to Self:   Risk to Others:   Prior Inpatient Therapy:   Prior Outpatient Therapy:    Past Medical History:  Past Medical History:  Diagnosis Date  . Anemia   . Arthritis    left knee  . Chronic kidney disease    acute renal failure/injury requiring short-term HD 2013  . Colon cancer ([place])   . Colon polyp    11/2011 - Polyps identified, biopsy - invasive adenocarinoma  . DM II (diabetes mellitus, type II), controlled ([place])    type 2 IDDM x 15 years. A1C 1/09 13.7.   Marland Kitchen 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 12/25/2011  . GERD (gastroesophageal reflux disease)   . Heart murmur   . 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 Hyperlipidemia   . Hypertension    for a few years and resolved in 2013/2014.  . Sleep apnea    does not wear CPAP now  . Wears glasses     Past Surgical History:  Procedure Laterality Date  . APPLICATION OF WOUND VAC  02/16/2012   Procedure: APPLICATION OF WOUND VAC;  Surgeon: Zenovia Jarred, MD;  Location: [facility];  Service: General;  Laterality: N/A;  . APPLICATION OF WOUND VAC  02/26/2012   Procedure: APPLICATION OF WOUND VAC;  Surgeon: Imogene Burn. [name], MD;  Location: [facility];  Service: General;  Laterality: N/A;  . AV FISTULA PLACEMENT Left 07/29/2019  Procedure: ARTERIOVENOUS (AV) FISTULA CREATION;  Surgeon: Marty Heck, MD;  Location: [place];  Service: Vascular;  Laterality: Left;  . COLONOSCOPY    . COLONOSCOPY  02/06/2012   Procedure: COLONOSCOPY;  Surgeon: Milus Banister, MD;  Location: [facility];  Service: Endoscopy;;  . COLONOSCOPY WITH PROPOFOL N/A 06/10/2019   Procedure: COLONOSCOPY WITH PROPOFOL;  Surgeon: Milus Banister, MD;  [facility];  Service: Endoscopy;  Laterality: N/A;  . COLOSTOMY REVERSAL    . DRESSING CHANGE UNDER ANESTHESIA  02/18/2012   Procedure: DRESSING CHANGE UNDER ANESTHESIA;  Surgeon: Odis Hollingshead, MD;  Location: [place];  Service: General;  Laterality: N/A;  . ESOPHAGOGASTRODUODENOSCOPY (EGD) WITH PROPOFOL N/A 06/10/2019   Procedure: ESOPHAGOGASTRODUODENOSCOPY (EGD) WITH PROPOFOL;  Surgeon: Milus Banister, MD;  [facility];  Service: Endoscopy;  Laterality: N/A;  . GANGLION CYST EXCISION  20 YRS AGO   RT ARM  . ILEOSTOMY  02/16/2012   Procedure: ILEOSTOMY;  Surgeon: Zenovia Jarred, MD;  Location: [facility];  Service: General;  Laterality: N/A;  . ILEOSTOMY CLOSURE N/A 09/02/2012   Procedure: ILEOSTOMY REVERSAL;  Surgeon: Imogene Burn. [name], MD;  Location: [place];  Service: General;  Laterality: N/A;  . ILIOSTOMY    . INCISION AND DRAINAGE OF WOUND  02/23/2012   Procedure: IRRIGATION AND DEBRIDEMENT WOUND;  Surgeon: Joyice Faster. Cornett, MD;  Location: [place];  Service: General;  Laterality: N/A;  . IR DIALY SHUNT INTRO [place] W/IMG LEFT Left 01/21/2020  . IR FLUORO GUIDE CV LINE RIGHT  01/21/2020  . IR REMOVAL TUN CV CATH W/O FL  01/07/2020  . IR US GUIDE VASC ACCESS LEFT  01/21/2020  . IR US GUIDE VASC ACCESS RIGHT  01/21/2020  . LAPAROTOMY  02/16/2012   Procedure: EXPLORATORY LAPAROTOMY;  Surgeon: Zenovia Jarred, MD;  Location: [facility];  Service: General;  Laterality: N/A;  Exploratory Laparotomy with resection of anastomosis  . LAPAROTOMY  02/18/2012   Procedure: EXPLORATORY LAPAROTOMY;  Surgeon: Odis Hollingshead, MD;  Location: [facility];  Service: General;  Laterality: N/A;  exploratory laparotomy  change of abdominal vac dressing  . LAPAROTOMY  02/20/2012   Procedure: EXPLORATORY LAPAROTOMY;  Surgeon: Odis Hollingshead, MD;  Location: [place];  Service: General;  Laterality: N/A;  . LAPAROTOMY  02/23/2012   Procedure: EXPLORATORY LAPAROTOMY;  Surgeon: Joyice Faster. [place], MD;  Location: [facility];  Service: General;  Laterality: N/A;  Irrigation and Debridement of abdominal wound with wound vac change with partial closure  . LAPAROTOMY  02/26/2012   Procedure: EXPLORATORY  LAPAROTOMY;  Surgeon: Imogene Burn. [name], MD;  Location: [place];  Service: General;  Laterality: N/A;     . PARTIAL COLECTOMY  02/06/2012  . PARTIAL COLECTOMY  02/06/2012   Procedure: PARTIAL COLECTOMY;  Surgeon: Imogene Burn. [name], MD;  Location: [place];  Service: General;  Laterality: N/A;  right partial colectomy  . POSTERIOR CERVICAL FUSION/FORAMINOTOMY N/A 01/06/2020   Procedure: CERVICAL FOUR- THORACIC TWO LAMINECTOMY AND FUSION;  Surgeon: Consuella Lose, MD;  Location: [facility];  Service: Neurosurgery;  Laterality: N/A;  CERVICAL FOUR- THORACIC TWO LAMINECTOMY AND FUSION  . RESECTION SMALL BOWEL / CLOSURE ILEOSTOMY  402/2014   Dr [name]  . VACUUM ASSISTED CLOSURE CHANGE  02/20/2012   Procedure: ABDOMINAL VACUUM ASSISTED CLOSURE CHANGE;  Surgeon: Odis Hollingshead, MD;  Location: [facility];  Service: General;;  . VACUUM ASSISTED CLOSURE CHANGE  02/23/2012   Procedure: ABDOMINAL VACUUM ASSISTED CLOSURE  CHANGE;  Surgeon: Joyice Faster. [place], MD;  Location: [facility];  Service: General;  Laterality: N/A;   Family History:  Family History  Problem Relation Age of Onset  . Diabetes Father   . Heart disease Father   . Hypertension Mother   . Diabetes Sister   . Heart disease Sister   . Colon cancer Neg Hx    Family Psychiatric  History:  Social History:  Social History   Substance and Sexual Activity  Alcohol Use Yes   Comment: Liquor twice monthly.     Social History   Substance and Sexual Activity  Drug Use Yes  . 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 Marital status: Divorced    Spouse name: Not on file  . Number of children: 3  . Years of education: Not on file  . Highest education level: Not on file  Occupational History  . Occupation: retired/disabled    Employer: [employer]    Comment: Worked at [employer]  . Smoking status: Current Every Day Smoker    Packs/day: 0.50    Years: 30.00    Pack  years: 15.00    Types: Cigarettes  . Smokeless tobacco: Never Used  Substance and Sexual Activity  . Alcohol use: Yes    Comment: Liquor twice monthly.  . 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 Sexual activity: Not on file  Other Topics Concern  . Not on file  Social History Narrative   Financial assistance approved for 100% discount at [facility] and has [facility] card   [employer]  August 08, 2009 5:48 PM   *PATIENT WAS GIVEN DM CARD.Lela Sturdivant NT II  June 08, 2010 3:56 PM      Divorced, one daughter currently staying with him   Has 7 grand children   Reports disability and medicaid is pending   Social Determinants of Health   Financial Resource Strain:   . Difficulty of Paying Living Expenses: Not on file  Food Insecurity:   . Worried About Charity fundraiser in the Last Year: Not on file  . Ran Out of Food in the Last Year: Not on file  Transportation Needs:   . Lack of Transportation (Medical): Not on file  . Lack of Transportation (Non-Medical): Not on file  Physical Activity:   . Days of Exercise per Week: Not on file  . Minutes of Exercise per Session: Not on file  Stress:   . Feeling of Stress : Not on file  Social Connections:   . Frequency of Communication with Friends and Family: Not on file  . Frequency of Social Gatherings with Friends and Family: Not on file  . Attends Religious Services: Not on file  . Active Member of Clubs or Organizations: Not on file  . Attends Archivist Meetings: Not on file  . Marital Status: Not on file   Additional Social History:    Allergies:  No Active Allergies  Labs:  Results for orders placed or performed during the hospital encounter of 02/20/20 (from the past 48 hour(s))  Glucose, capillary     Status: Abnormal   Collection Time: 03/14/20  8:46 PM  Result Value Ref Range   Glucose-Capillary 114 (H) 70 - 99 mg/dL    Comment: Glucose reference range applies  only to samples taken after fasting for at least 8 hours.  Glucose, capillary     Status: None   Collection Time: 03/15/20  8:23 AM  Result Value Ref Range   Glucose-Capillary 94 70 - 99 mg/dL    Comment: Glucose reference range applies only to samples taken after fasting for at least 8 hours.  Glucose, capillary     Status: Abnormal   Collection Time: 03/15/20 12:41 PM  Result Value Ref Range   Glucose-Capillary 117 (H) 70 - 99 mg/dL    Comment: Glucose reference range applies only to samples taken after fasting for at least 8 hours.  Glucose, capillary     Status: Abnormal   Collection Time: 03/15/20  5:45 PM  Result Value Ref Range   Glucose-Capillary 123 (H) 70 - 99 mg/dL    Comment: Glucose reference range applies only to samples taken after fasting for at least 8 hours.  Glucose, capillary     Status: Abnormal   Collection Time: 03/15/20  8:23 PM  Result Value Ref Range   Glucose-Capillary 109 (H) 70 - 99 mg/dL    Comment: Glucose reference range applies only to samples taken after fasting for at least 8 hours.  Glucose, capillary     Status: Abnormal   Collection Time: 03/16/20 11:30 AM  Result Value Ref Range   Glucose-Capillary 155 (H) 70 - 99 mg/dL    Comment: Glucose reference range applies only to samples taken after fasting for at least 8 hours.    Current Facility-Administered Medications  Medication Dose Route Frequency Provider Last Rate Last Admin  . amLODipine (NORVASC) tablet 10 mg  10 mg Oral Daily Thurnell Lose, MD   10 mg at 03/16/20 0949  . calcitRIOL (ROCALTROL) capsule 1.5 mcg  1.5 mcg Oral Q M,W,F-HD Skeet Simmer, [facility]      . chlorhexidine (PERIDEX) 0.12 % solution 15 mL  15 mL Mouth Rinse BID Spero Geralds, MD   15 mL at 03/07/20 0830  . Chlorhexidine Gluconate Cloth 2 % PADS 6 each  6 each Topical Q0600 Claudia Desanctis, MD   6 each at 03/09/20 0630  . collagenase (SANTYL) ointment   Topical Daily Bonnielee Haff, MD   Given at 03/15/20 [phone]  .  Darbepoetin Alfa (ARANESP) injection 150 mcg  150 mcg Intravenous Q Wed-HD Claudia Desanctis, MD   150 mcg at 03/08/20 0929  . doxycycline (VIBRA-TABS) tablet 100 mg  100 mg Oral Q12H Bonnielee Haff, MD   100 mg at 03/16/20 0948  . feeding supplement (NEPRO CARB STEADY) liquid 237 mL  237 mL Oral BID BM Bonnielee Haff, MD   237 mL at 03/16/20 1509  . haloperidol lactate (HALDOL) injection 2.5 mg  2.5 mg Intramuscular Q6H PRN Bonnielee Haff, MD   2.5 mg at 03/14/20 2044  . insulin aspart (novoLOG) injection 0-5 Units  0-5 Units Subcutaneous QHS Bonnielee Haff, MD      . insulin aspart (novoLOG) injection 0-9 Units  0-9 Units Subcutaneous TID WC Bonnielee Haff, MD   2 Units at 03/16/20 1258  . lip balm (CARMEX) ointment   Topical PRN Spero Geralds, MD      . loperamide (IMODIUM) capsule 4 mg  4 mg Oral TID PRN Bonnielee Haff, MD   4 mg at 03/12/20 2238  . MEDLINE mouth rinse  15 mL Mouth Rinse q12n4p Spero Geralds, MD   15 mL at 03/09/20 1648  . metoprolol tartrate (LOPRESSOR) injection 2.5  mg  2.5 mg Intravenous Q4H PRN Spero Geralds, MD   2.5 mg at 02/29/20 1858  . multivitamin (RENA-VIT) tablet 1 tablet  1 tablet Oral QHS Bonnielee Haff, MD   1 tablet at 03/14/20 2054  . ondansetron (ZOFRAN) injection 4 mg  4 mg Intravenous Q6H PRN Bonnielee Haff, MD      . pantoprazole (PROTONIX) EC tablet 40 mg  40 mg Oral Daily Bonnielee Haff, MD   40 mg at 03/16/20 0948  . QUEtiapine (SEROQUEL) tablet 50 mg  50 mg Oral BID Corena Pilgrim, MD   50 mg at 03/16/20 0949  . Resource ThickenUp Clear   Oral PRN Bonnielee Haff, MD      . thiamine tablet 100 mg  100 mg Oral Daily Bonnielee Haff, MD   100 mg at 03/16/20 0948  . zinc sulfate capsule 220 mg  220 mg Oral Daily Bonnielee Haff, MD   220 mg at 03/16/20 5364    Musculoskeletal: Strength & Muscle Tone: not tested Gait & Station: patient refused Patient leans: N/A  Psychiatric Specialty Exam: Physical Exam Vitals and nursing note  reviewed.  Constitutional:      Appearance: Normal appearance.  HENT:     Head: Normocephalic.     Nose: Nose normal.  Pulmonary:     Effort: Pulmonary effort is normal.  Musculoskeletal:     Cervical back: Normal range of motion.  Neurological:     General: No focal deficit present.     Mental Status: He is alert.  Psychiatric:        Attention and Perception: He is inattentive.        Mood and Affect: Affect is angry.        Speech: Speech normal.        Behavior: Behavior is uncooperative.        Thought Content: Thought content normal.        Judgment: Judgment is impulsive.     Review of Systems  Psychiatric/Behavioral: The patient is nervous/anxious.   All other systems reviewed and are negative.   Blood pressure (!) 158/74, pulse 85, temperature 97.6 F (36.4 C), temperature source Oral, resp. rate 18, height 6' (1.829 m), weight 82.7 kg, SpO2 100 %.Body mass index is 24.73 kg/m.  General Appearance: Casual  Eye Contact:  Good  Speech:  Clear and Coherent  Volume:  Increased  Mood:  Angry  Affect:  Labile  Thought Process: unable to determine, patient refused psych evaluation  Orientation:  Other:  unable to determine, patient refused psychiatric assessment.  Thought Content:  unable to determine, patient refused psychiatric assessment  Suicidal Thoughts:  unable to determine, patient refused psychiatric assessment  Homicidal Thoughts:  unable to determine, patient refused psychiatric assessment  Memory:  unable to determine, patient refused psychiatric assessment  Judgement:  Poor  Insight:  Lacking  Psychomotor Activity:  Increased  Concentration:  Concentration: Poor  Recall:  Poor  Fund of Knowledge:  unable to determine, patient refused psychiatric assessment  Language:  Good  Akathisia:  No  Handed:  Right  AIMS (if indicated):     Assets:  Social Support  ADL's:  unable to determine, patient refused psychiatric assessment  Cognition:  WNL  Sleep:        Recommendations: -Consider discussing Guardianship application with patient's daughter so that the family can take over patient's medical decision ability. -Consider increasing Seroquel to 50 mg bid for aggression -Continue Haloperidol 2.5 mg PO Q6hrs as needed for  agitation or IM if patient refused PO  Patient does not have capacity to make medical decisions at this time.    Disposition: No evidence of imminent risk to self or others at present.   Patient does not meet criteria for psychiatric inpatient admission. Re-consult psychiatric service as needed   Waylan Boga, NP 03/16/2020 6:06 PM

## 2020-03-16 NOTE — Plan of Care (Signed)

## 2020-03-16 NOTE — Progress Notes (Signed)
Daily Progress Note   Patient Name: [name].       Date: 03/16/2020 DOB: 04/04/1951  Age: 69 y.o. MRN#: [id number] Attending Physician: Mariel Aloe, MD Primary Care Physician: [facility] Admit Date: 02/20/2020  Reason for Consultation/Follow-up: Establishing goals of care  Subjective: Patient awake, alert, sitting up in bed eating lunch. Prepared [name] that I have scheduled a meeting with family, him, and PMT provider tomorrow 10/15 at 1130 to discuss the plan. Patient understands and agrees with the meeting.   GOC:  Spoke with daughter, [name] via telephone. Introduced role of palliative medicine and need for follow-up discussions regarding her father's goals and plan.   Discussed course of hospitalization including diagnoses, interventions, plan of care. Discussed ongoing non-compliance with hemodialysis.   [name] speaks of challenges with her father losing his independence in a short period of time. "Independence stripped of him over night." He has been in and out of the hospital since August 2021. [name] shares that her father has been intermittently non-compliant with dialysis in the clinic even before he became sick in August. It took him 4 years to final agree to start hemodialysis.   [name] seems to be in a different place now and speaks of being at "an accepting point to respect his wishes" and "enjoy what's left" if his decision is to stop dialysis and focus on comfort/hospice. He has frequently declined doing POA paperwork with her.   [name] and I scheduled a family meeting for tomorrow 10/15 at 25 am. We are hopeful that her father will open up about his goals and wishes with multiple family members present. Answered questions. PMT contact  information given.   Length of Stay: 25  Current Medications: Scheduled Meds:  . amLODipine  10 mg Oral Daily  . calcitRIOL  1.5 mcg Oral Q M,W,F-HD  . chlorhexidine  15 mL Mouth Rinse BID  . Chlorhexidine Gluconate Cloth  6 each Topical Q0600  . collagenase   Topical Daily  . darbepoetin (ARANESP) injection - DIALYSIS  150 mcg Intravenous Q Wed-HD  . doxycycline  100 mg Oral Q12H  . feeding supplement (NEPRO CARB STEADY)  237 mL Oral BID BM  . insulin aspart  0-5 Units Subcutaneous QHS  . insulin aspart  0-9 Units Subcutaneous TID WC  .  mouth rinse  15 mL Mouth Rinse q12n4p  . multivitamin  1 tablet Oral QHS  . pantoprazole  40 mg Oral Daily  . QUEtiapine  50 mg Oral BID  . thiamine  100 mg Oral Daily  . zinc sulfate  220 mg Oral Daily    Continuous Infusions:   PRN Meds: haloperidol lactate, lip balm, loperamide, metoprolol tartrate, [DISCONTINUED] ondansetron **OR** ondansetron (ZOFRAN) IV, Resource ThickenUp Clear  Physical Exam Vitals and nursing note reviewed.  Constitutional:      General: He is awake.  Pulmonary:     Effort: No tachypnea, accessory muscle usage or respiratory distress.  Skin:    General: Skin is warm and dry.  Neurological:     Mental Status: He is alert and oriented to person, place, and time.  Psychiatric:     Comments: irritable            Vital Signs: BP (!) 158/74 (BP Location: Right Arm)   Pulse 85   Temp 97.6 F (36.4 C) (Oral)   Resp 18   Ht 6' (1.829 m)   Wt 82.7 kg   SpO2 100%   BMI 24.73 kg/m  SpO2: SpO2:  (pt refused vitals) O2 Device: O2 Device: Room Air O2 Flow Rate: O2 Flow Rate (L/min): 2 L/min  Intake/output summary:   Intake/Output Summary (Last 24 hours) at 03/16/2020 1134 Last data filed at 03/16/2020 0946 Gross per 24 hour  Intake 480 ml  Output --  Net 480 ml   LBM: Last BM Date: 03/13/20 Baseline Weight: Weight: 86.2 kg Most recent weight: Weight: 82.7 kg       Palliative Assessment/Data: PPS  50%    Flowsheet Rows     Most Recent Value  Intake Tab  Referral Department Hospitalist  Unit at Time of Referral ICU  Palliative Care Primary Diagnosis Sepsis/Infectious Disease  Date Notified 02/23/20  Palliative Care Type New Palliative care  Reason for referral Clarify Goals of Care  Date of Admission 02/20/20  Date first seen by Palliative Care 02/24/20  # of days Palliative referral response time 1 Day(s)  # of days IP prior to Palliative referral 3  Clinical Assessment  Palliative Performance Scale Score 10%  Pain Max last 24 hours Not able to report  Pain Min Last 24 hours Not able to report  Dyspnea Max Last 24 Hours Not able to report  Dyspnea Min Last 24 hours Not able to report  Psychosocial & Spiritual Assessment  Palliative Care Outcomes      Patient Active Problem List   Diagnosis Date Noted  . Hx of bacteremia   . Goals of care, counseling/discussion   . Palliative care by specialist   . DNR (do not resuscitate) discussion   . COVID-19   . Acute metabolic encephalopathy   . Hemodialysis patient ([place])   . Bacteremia associated with intravascular line ([place])   . MRSA bacteremia 01/05/2020  . 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 Acute encephalopathy 01/04/2020  . Anemia of chronic disease 02/11/2019  . Type 2 diabetes mellitus with renal complication ([place]) 30/86/5784  . ESRD (end stage renal disease) ([place])   . Cardiac murmur 01/08/2019  . Preoperative clearance 01/08/2019  . Uncontrolled Type 2 DM 09/15/2013  . HYPERTENSION 03/08/2013  . CKD  06/24/2012  . Hyperkalemia 06/24/2012  . Colon cancer, status Post resection  05/11/2012  . Malnutrition of moderate degree ([place]) 02/24/2012  . Health care maintenance 11/27/2011  . Obesity (BMI 30.0-34.9) 10/20/2011  . Cataract,  bilateral 08/29/2011  . Hyperlipidemia 04/14/2006  . MICROCYTIC ANEMIA 04/14/2006  . TOBACCO ABUSE 04/14/2006  . OBSTRUCTIVE SLEEP APNEA 04/14/2006    Palliative Care Assessment & Plan    Patient Profile: 69 y.o. male  with past medical history of HTN/HLD, ESRD on HD, DM 2, anemia of chronic disease, colon cancer SP resection, epidural abscess SP laminectomy with fusion 01/2020, MRSA bacteremia admitted on 02/20/2020 with Covid infection, acute metabolic encephalopathy likely in setting of sepsis due to COVID-19 infection, epidural abscess status post laminectomy and fusion August 2021, went to short-term rehab.    Assessment: ESRD on hemodialysis COVID-19 pneumonia HCAP Acute metabolic encephalopathy Osteomyelitis of skull base Septic shock Cirrhosis, hepatitis C DM type 2  Recommendations/Plan:  Continue full code/full scope treatment.  [place] meeting with patient and family tomorrow 10/15 at 1130.   Code Status: FULL   Code Status Orders  (From admission, onward)         Start     Ordered   02/20/20 1629  Full code  Continuous        02/20/20 1630        Code Status History    Date Active Date Inactive Code Status Order ID Comments User Context   01/04/2020 2213 01/24/2020 1826 Full Code [id number]  Rise Patience, MD ED   02/12/2019 0001 02/12/2019 1809 Full Code [id number]  Rise Patience, MD Inpatient   04/03/2013 1902 04/04/2013 1714 Full Code [id number]  Clinton Gallant, MD Inpatient   09/02/2012 1337 09/08/2012 1316 Full Code [id number]  Maia Petties., MD Inpatient   03/30/2012 0250 04/06/2012 2116 Full Code [id number]  Trish Fountain, MD Inpatient   02/16/2012 1512 02/16/2012 1816 Full Code [id number]  Earnstine Regal, [place] [facility]   02/06/2012 1514 02/09/2012 1624 Full Code [id number]  Peri Jefferson, RN Inpatient   Advance Care Planning Activity       Prognosis:   Guarded  Discharge Planning:  To Be Determined  Care plan was discussed with daughter [name]), [facility] renal navigator, RN, Dr Lonny Prude, patient  Thank you for allowing the Palliative Medicine Team to assist in the care of this patient.   Total Time 40 Prolonged Time Billed  no       Greater than 50%  of this time was spent counseling and coordinating care related to the above assessment and plan.  Ihor Dow, DNP, FNP-C Palliative Medicine Team  Phone: [phone] Fax: [phone]  Please contact Palliative Medicine Team phone at [phone] for questions and concerns.

## 2020-03-16 NOTE — TOC Progression Note (Signed)
Transition of Care (TOC) - Progression Note    Patient Details  Name: [name]. MRN: [id number] Date of Birth: 01-Apr-1951  Transition of Care [facility]) CM/SW Contact  Curlene Labrum, RN Phone Number: 03/16/2020, 11:45 AM  Clinical Narrative:    Case management spoke with Otila Kluver, CM with [facility], and alerted the facility that the patient is waiting on a palliative care consult to determine patient's goals of care.  The patient has been refusing HD treatments this week at [facility] and patient told the attending physician, Dr. Lonny Prude that he is refusing HD here due to the unit "being too cold".  [place] has an available bed at the facility tomorrow, 03/17/2020 if the patient is able to return to the SNF and be followed by Palliative care.  I will continue to follow the patient for transitions of care needs after Palliative care evaluates the patient.   Expected Discharge Plan: Skilled Nursing Facility Barriers to Discharge: Continued Medical Work up  Expected Discharge Plan and Services Expected Discharge Plan: [place] In-house Referral: Clinical Social Work Discharge Planning Services: CM Consult Post Acute Care Choice: Durable Medical Equipment, Home Health Living arrangements for the past 2 months: Apartment                       Social Determinants of Health (SDOH) Interventions    Readmission Risk Interventions Readmission Risk Prevention Plan 02/22/2020 01/18/2020  Transportation Screening Complete Complete  Medication Review Press photographer) Referral to Pharmacy Complete  PCP or Specialist appointment within 3-5 days of discharge Complete Complete  HRI or [place] Complete Complete  SW Recovery Care/Counseling Consult Complete Complete  Palliative Care Screening Not Applicable Complete  [place] Patient Refused Complete  Some recent data might be hidden

## 2020-03-16 NOTE — Progress Notes (Signed)
Nutrition Follow-up  DOCUMENTATION CODES:   Non-severe (moderate) malnutrition in context of chronic illness  INTERVENTION:   Continue:    Magic cup TID with meals, each supplement provides 290 kcal and 9 grams of protein  Nepro Shake po BID, each supplement provides 425 kcal and 19 grams protein  Renal MVI daily    NUTRITION DIAGNOSIS:   Moderate Malnutrition related to chronic illness (ESRD on HD; now COVID) as evidenced by moderate fat depletion, moderate muscle depletion.  Ongoing  GOAL:   Patient will meet greater than or equal to 90% of their needs  Progressing   MONITOR:   PO intake, Supplement acceptance, Skin, Weight trends, Labs, I & O's  REASON FOR ASSESSMENT:   Consult Enteral/tube feeding initiation and management  ASSESSMENT:   69 yo male admitted with acute metabolic encephalopathy likely in setting of COVID-19 infection, developed respiratory failure and septic shock requiring transfer to ICU and intubation. PMH includes ESRD on HD, liver cirrhosis, colon cancer s/p hemicolectomy, polysubstance abuse  9/18 COVID+ dx 9/19 Admission 9/22 Agonal breathing post iHD, hypoglycemic, hypotensive, lethargic; transferred to ICU, Intubated 9/25 extubated 9/27 precedex stopped; cortrak placed with tip gastric 10/1 Diet advanced to dysphagia 2 diet with nectar thick liquids 10/2 Cortrak NGT removed 10/7 Diet advanced to DYS 3 with thin liquids   Reviewed I/O's: +240 ml x 24 hours and +805 ml since 03/02/20  Spoke with pt at bedside, who was pleasant and in good spirits today. He reports his appetite has improved and he ate "almost all" of his breakfast. Noted meal completion 50-100% in the past 72 hours. Pt shares that he needs softer foods due to mastication difficulties and his dysphagia 3 diet is helping with this. Pt with intermittent acceptance of Nepro shakes; he reports that he does not care for them, but will drink them on occasion, as he knows they  are important to help with nutritional status.   Per notes, pt has been refusing HD treatments. Palliative care consulted to discuss goals of care. Per RNCM notes, pt may d/c back to SNF tomorrow if he agrees to outpatient HD.   Labs reviewed: CBGS: 109-155.   Diet Order:   Diet Order            DIET DYS 3 Room service appropriate? No; Fluid consistency: Thin  Diet effective now                 EDUCATION NEEDS:   Not appropriate for education at this time  Skin:  Skin Assessment: Skin Integrity Issues: Skin Integrity Issues:: Other (Comment) Unstageable: buttocks Other: skin tears to buttocks 2/2 to rectal tube  Last BM:  03/13/20  Height:   Ht Readings from Last 1 Encounters:  02/21/20 6' (1.829 m)    Weight:   Wt Readings from Last 1 Encounters:  03/14/20 82.7 kg    BMI:  Body mass index is 24.73 kg/m.  Estimated Nutritional Needs:   Kcal:  2050-2260 kcals  Protein:  130-150 g  Fluid:  1000 mL plus UOP    Loistine Chance, RD, LDN, [place] Registered Dietitian II Certified Diabetes Care and Education Specialist Please refer to [facility] for RD and/or RD on-call/weekend/after hours pager

## 2020-03-17 DIAGNOSIS — I10 Essential (primary) hypertension: Secondary | ICD-10-CM | POA: Diagnosis not present

## 2020-03-17 DIAGNOSIS — G9341 Metabolic encephalopathy: Secondary | ICD-10-CM | POA: Diagnosis not present

## 2020-03-17 DIAGNOSIS — N186 End stage renal disease: Secondary | ICD-10-CM | POA: Diagnosis not present

## 2020-03-17 DIAGNOSIS — G062 Extradural and subdural abscess, unspecified: Secondary | ICD-10-CM | POA: Diagnosis not present

## 2020-03-17 DIAGNOSIS — E875 Hyperkalemia: Secondary | ICD-10-CM | POA: Diagnosis not present

## 2020-03-17 DIAGNOSIS — Z87898 Personal history of other specified conditions: Secondary | ICD-10-CM | POA: Diagnosis not present

## 2020-03-17 LAB — GLUCOSE, CAPILLARY
Glucose-Capillary: 161 mg/dL — ABNORMAL HIGH (ref 70–99)
Glucose-Capillary: 101 mg/dL — ABNORMAL HIGH (ref 70–99)
Glucose-Capillary: 112 mg/dL — ABNORMAL HIGH (ref 70–99)
Glucose-Capillary: 103 mg/dL — ABNORMAL HIGH (ref 70–99)

## 2020-03-17 MED ORDER — ACETAMINOPHEN 325 MG PO TABS
650.0000 mg | ORAL_TABLET | Freq: Four times a day (QID) | ORAL | Status: DC | PRN
  Administered 2020-03-19: 650 mg via ORAL
  Filled 2020-03-17 (×2): qty 2

## 2020-03-17 MED ORDER — HYDROCODONE-ACETAMINOPHEN 5-325 MG PO TABS
1.0000 | ORAL_TABLET | Freq: Four times a day (QID) | ORAL | Status: DC | PRN
  Administered 2020-03-17 – 2020-03-18 (×2): 1 via ORAL
  Filled 2020-03-17 (×2): qty 1

## 2020-03-17 NOTE — Progress Notes (Signed)
PROGRESS NOTE    [name].  NWG:[id number] DOB: 1951/05/21 DOA: 02/20/2020 PCP: [facility]   Brief Narrative: Rodney [facility]. is a 69 y.o. malewith PMHx of ESRD on HD MWF, DM-2, MRSA bacteremia with epidural abscess s/p C4-T2 laminectomy/fusion on 01/06/2020-on IV vancomycin/rifampin through 10/1-resident of SNF-apparently tested positive for COVID-19 on 9/18-subsequently brought to the ED on 9/19 for worsening confusion (more than baseline)-fever-thought to have acute metabolic encephalopathy from COVID-19 infection and admitted to the hospitalist service. Further hospital course complicated by development of hypotension/severe encephalopathy requiring intubation and ICU transfer.   Significant Events: 8/3-8/23>>hospitalized for epidural abscess/MRSA bacteremia-underwent laminectomy-briefly intubated due to altered mental status. Discharged to SNF (prior admission) 9/18>> COVID-19 positive at SNF 9/19>> Admit to [facility] for fever/confusion 9/22>> obtunded/hypotensive after coming back from hemodialysis-emergently intubated/transferred to ICU 9/30>> transferred back to [facility] 9/30>> SQ heparin stopped-pharmacy consulted for HIT protocol.  Significant studies: 8/6>> Echo: EF> 75% 9/19>>Chest x-ray: No acute abnormality in the lung. 9/19>> CT head: No acute intracranial abnormality 9/19>> CT C-spine: Postoperative findings of posterior laminectomy/fusion C4-T1, prevertebral soft tissue edema appears reduced, postoperative fluid collection overlies laminectomy site does not appear significantly changed compared to prior examination. 9/20>> MRI C-spine: Attempted but due to AMS-not completed 9/21>> MRI C Spine: Osteomyelitis of skull base, anterior C1 and odontoid, C2-C3 facet joint-associated erosion of bone since 8/27-resolved epidural abscess 9/22>> coarsened/heterogeneous appearance of the liver parenchyma-suspicious for cirrhosis, gallbladder not  visualized. 9/22>> bilateral lower extremity Doppler: No DVT 9/29>> chest x-ray: Bibasilar infiltrates suggesting multifocal pneumonia   Assessment & Plan:   Principal Problem:   Acute metabolic encephalopathy Active Problems:   Hyperlipidemia   Hyperkalemia   HYPERTENSION   Anemia of chronic disease   Type 2 diabetes mellitus with renal complication (HCC)   ESRD (end stage renal disease) (HCC)   Epidural abscess   COVID-19   Hx of bacteremia   Goals of care, counseling/discussion   Palliative care by specialist   DNR (do not resuscitate) discussion   Acute respiratory failure with hypoxia Secondary to below. Patient required intubation and mechanical ventilation and was extubated on 9/26. Currently on room air.  COVID-19 pneumonia HCAP Sputum culture significant for MSSA and E. Coli. He was treated with Cefazolin for bacterial pneumonia in addition to Remdesivir. Remdesivir treatment was discontinued secondary to significant rise in LFTs. Patient has completed quarantine period.  Acute metabolic encephalopathy Likely multifactorial in setting of uremia vs sepsis. Appears to be resolved. Negative CT head.  Osteomyelitis of skull base Neurosurgery consulted with no recommendations for surgical management at this time. Patient was managed on Vancomycin and Rifampin, however this was switched to doxycycline secondary to elevated LFTs. Currently on Doxycycline with recommendations for follow-up at the ID clinic 3-4 weeks after discharge in addition to neurosurgery follow-up. -Continue Doxycycline  Septic shock In setting of pneumonia. Required vasopressor support in the ICU which is now resolved.  Dysphagia -Continue dysphagia 3 diet  Thrombocytopenia Mild. Stable.  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 Patient is refusing HD. Had discussion with  patient for which he states he does not like having HD at [facility] secondary to the HD unit being cold. He understands that not having HD will likely lead to his death and instead will wait until he can discharge to resume HD. Per psychiatry, patient does not have capacity, however this does  not address the issue of patient needing to be compliant with HD for safety. -Palliative care discussions today -HD per nephrology if/when able/allowed by patient  Essential hypertension -Continue amlodipine  Hyperlipidemia Patient is on Lipitor as an outpatient which was held secondary to elevated LFTs this admission.  Diabetes mellitus, type 2 Hemoglobin A1C of 6.5%. Patient is on Levemir 11 units BID as an outpatient. -Continue SSI  Pressure injury Right/left buttock, not present on admission   DVT prophylaxis: SCDs Code Status:   Code Status: Full Code Family Communication: None at bedside. Disposition Plan: Discharge to SNF when bed available and pending goals of care discussions since patient is currently declining HD inpatient; possibly discharge to hospice pending on goals of care   Consultants:   PCCM  Nephrology  Neurosurgery  Palliative care medicine  Procedures:     Antimicrobials:      Subjective: Patient still will not agree to HD.   Objective: Vitals:   03/16/20 1957 03/17/20 0400 03/17/20 0827 03/17/20 0915  BP: (!) 152/65 (!) 162/77 (!) 148/81   Pulse: 90 85 98   Resp: 18 19 17    Temp: 98.1 F (36.7 C) 98.3 F (36.8 C)  98.2 F (36.8 C)  TempSrc: Oral Oral  Oral  SpO2:   100%   Weight:      Height:        Intake/Output Summary (Last 24 hours) at 03/17/2020 1047 Last data filed at 03/17/2020 0900 Gross per 24 hour  Intake 960 ml  Output 4 ml  Net 956 ml   Filed Weights   03/10/20 0500 03/11/20 1356 03/14/20 1347  Weight: 85.3 kg 85.3 kg 82.7 kg    Examination:  General exam: Appears calm and comfortable Respiratory system: Clear to  auscultation. Respiratory effort normal. Cardiovascular system: S1 & S2 heard, RRR. No murmurs, rubs, gallops or clicks. Gastrointestinal system: Abdomen is nondistended, soft and nontender. No organomegaly or masses felt. Normal bowel sounds heard. Central nervous system: Alert and oriented to person and place. No focal neurological deficits. Musculoskeletal: No edema. No calf tenderness Skin: No cyanosis. No rashes Psychiatry: Judgement and insight appear impaired.   Data Reviewed: I have personally reviewed following labs and imaging studies  CBC Lab Results  Component Value Date   WBC 4.7 03/14/2020   RBC 2.88 (L) 03/14/2020   HGB 7.7 (L) 03/14/2020   HCT 24.4 (L) 03/14/2020   MCV 84.7 03/14/2020   MCH 26.7 03/14/2020   PLT 146 (L) 03/14/2020   MCHC 31.6 03/14/2020   RDW 18.9 (H) 03/14/2020   LYMPHSABS 1.0 02/24/2020   MONOABS 0.2 02/24/2020   EOSABS 0.0 02/24/2020   BASOSABS 0.0 62/22/9798     Last metabolic panel Lab Results  Component Value Date   NA 135 03/14/2020   K 4.5 03/14/2020   CL 107 03/14/2020   CO2 19 (L) 03/14/2020   BUN 38 (H) 03/14/2020   CREATININE 10.40 (H) 03/14/2020   GLUCOSE 134 (H) 03/14/2020   GFRNONAA 5 (L) 03/14/2020   GFRAA 9 (L) 03/05/2020   CALCIUM 7.7 (L) 03/14/2020   PHOS 4.9 (H) 03/14/2020   PROT 6.1 (L) 03/05/2020   ALBUMIN 1.9 (L) 03/14/2020   BILITOT 1.3 (H) 03/05/2020   ALKPHOS 93 03/05/2020   AST 30 03/05/2020   ALT 9 03/05/2020   ANIONGAP 9 03/14/2020    CBG (last 3)  Recent Labs    03/15/20 2023 03/16/20 1130 03/17/20 0823  GLUCAP 109* 155* 161*  GFR: Estimated Creatinine Clearance: 7.5 mL/min (A) (by C-G formula based on SCr of 10.4 mg/dL (H)).  Coagulation Profile: No results for input(s): INR, PROTIME in the last 168 hours.  No results found for this or any previous visit (from the past 240 hour(s)).      Radiology Studies: No results found.      Scheduled Meds:  amLODipine  10 mg Oral  Daily   calcitRIOL  1.5 mcg Oral Q M,W,F-HD   chlorhexidine  15 mL Mouth Rinse BID   Chlorhexidine Gluconate Cloth  6 each Topical Q0600   collagenase   Topical Daily   darbepoetin (ARANESP) injection - DIALYSIS  150 mcg Intravenous Q Wed-HD   doxycycline  100 mg Oral Q12H   feeding supplement (NEPRO CARB STEADY)  237 mL Oral BID BM   insulin aspart  0-5 Units Subcutaneous QHS   insulin aspart  0-9 Units Subcutaneous TID WC   mouth rinse  15 mL Mouth Rinse q12n4p   multivitamin  1 tablet Oral QHS   pantoprazole  40 mg Oral Daily   QUEtiapine  50 mg Oral BID   thiamine  100 mg Oral Daily   zinc sulfate  220 mg Oral Daily   Continuous Infusions:   LOS: 26 days     Cordelia Poche, MD [facility] 03/17/2020, 10:47 AM  If 7PM-7AM, please contact night-coverage [email or web address]

## 2020-03-17 NOTE — Progress Notes (Signed)
Subjective: Seen in room , he is aware today's Friday at [facility], discussed hemodialysis possibility that he said he is not sure but did not refuse HD as he did yesterday.  Has no complaints, notes seen by psych yesterday made diagnosis of patient's inability to have mental capacity to make informed decision.  Daughter possibly to have a guardianship, noted per palliative care 1130 AM [place] meeting today with patient and family.  Refusing insulin sliding scale coverage this a.m.  Objective Vital signs in last 24 hours: Vitals:   03/15/20 2122 03/16/20 1957 03/17/20 0400 03/17/20 0827  BP:  (!) 152/65 (!) 162/77 (!) 148/81  Pulse:  90 85 98  Resp:  18 19 17   Temp: 97.6 F (36.4 C) 98.1 F (36.7 C) 98.3 F (36.8 C)   TempSrc: Oral Oral Oral   SpO2: 100%   100%  Weight:      Height:       Weight change:   Physical Exam: General: Alert elderly male lying in bed NAD pleasantly confused Heart: RRR, no MRG Lungs: CTA, nonlabored, noted 100% O2 sat room air Abdomen: Bowel sounds normoactive, soft NTND, no ascites Extremities:no  Pedal edema Dialysis access; right IJ PermCath dressing off site nontender no discharge, left upper arm AV fistula positive bruit    Last CXR 9/23 - patchy perihilar/ basilar dz, no edema      OP HD: [facility] MWF   4h 52min  87kg  2/2 bath  TDC / L AVF  Hep none  -Mircera 150 mcg IV q 2 weeks (last dose 02/09/2020) -Venofer 100 mg IV X 8 doses (not started yet) -Calcitriol 1.5 mcg PO TIW  Problem/Plan: 1.  ESRD - MWF HD.  Last HD Saturday, 03/11/2020, refused HD yesterday, noted Tuesday tried to pull needles out in unit last potassium 4.5=10/ 12, volume and O2 sat okay today .we will write orders for dialysis today as he said he will think about it, use PermCath because the risk of pulling lines out with AMS /HIT testing was negative. OK to use heparin to block catheter HD noted goals of care meeting today 11:30 AM 2. AMS - [place] not  agitated this morning,  , yesterday he continued to refuse treatments, including dialysis.  Today he is not sure about refusing seen by psychiatry 03/06/20  Psychiatry suggesting guardianship with daughter/family.  Diagnosis =''patient does not have the mental capacity to make an informed decision, patient does not require inpatient Geri psych care '' 3. HD access - L BC AVF placed Feb 2021. Was used starting in May and had infiltration in Aug prompting Navos placement to rest the AVF. Have used AVF w/o issues on last 3 HD sessions. TDC would be ready for removal, but due to lack of good IV access have kept in for now.  4. Hypertension/volume - Previously on high dose midodrine here and this is now off.  Keep even on HD, has not tolerated UF here lately.  5. COVID PNA/ HCAP (EColi/ MSSA resp culture): Tested positive at [facility] 09/18 and again here. Got IV cefazolin thru 10/1. Remdesivir stopped 9/22 d/t ^LFT's.  COVID isolation ended 03/12/20.  6. Acute hypoxic resp failure.  At one point required mechanical ventilation. Resolved.  7. Recent epidural abscess/MRSA cervical discitis-  S/p decompression surgery C4-T2 on 01/06/20 during prior admission. Pt admitted this time on 9/19 w/ AMS.  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  NSurg recommended no further surgery.  Plans w/ ID were for Vancomycin and Rifampin here through 10/01 but rifampin dc'd early due to ^LFT"s. PO doxy started on 10/01 and pt is now off IV vanc. Will f/u w/ ID clinic in 3-4 wks, they will guide on the stop date of the po doxy. 8. Anemia -  Hgb trending down last 7.7 on 10/12.  Aranesp increased to 150 mcg for 9/29 onward for now. Hold OP Fe load secondary infection. s/p PRBC's 9/29 9. Metabolic bone disease - off renvela due to hypophosphatemia, phosphorus 4.9 on 10/12  Anticipate need to resume at a lower dose per trends., on calcitriol -10. DM-per primary 10.  Low plts - 2nd level testing for HIT was negative. Improved.  11.  H/o  HCV/ liver cirrhosis - hep C testing here was negative. Per pmd will need GI f/u in OP setting.  12. Dispo - awaiting SNF placement. Has been refusing dialysis this week. Palliative care being as above with GOC.   7:30 AM today  Ernest Haber, PA-C [facility] [phone] 03/17/2020,8:39 AM  LOS: 26 days   Labs: Basic Metabolic Panel: Recent Labs  Lab 03/10/20 1109 03/14/20 1500  NA 138 135  K 4.2 4.5  CL 103 107  CO2 25 19*  GLUCOSE 121* 134*  BUN 35* 38*  CREATININE 8.71* 10.40*  CALCIUM 7.5* 7.7*  PHOS 4.9* 4.9*   Liver Function Tests: Recent Labs  Lab 03/10/20 1109 03/14/20 1500  ALBUMIN 2.0* 1.9*   No results for input(s): LIPASE, AMYLASE in the last 168 hours. No results for input(s): AMMONIA in the last 168 hours. CBC: Recent Labs  Lab 03/10/20 1109 03/10/20 1109 03/11/20 0233 03/12/20 1747 03/14/20 1500  WBC 7.1   < > 6.7 6.6 4.7  HGB 7.9*   < > 7.8* 8.4* 7.7*  HCT 24.9*   < > 24.0* 25.9* 24.4*  MCV 85.6  --  84.5 86.0 84.7  PLT 139*   < > 130* 154 146*   < > = values in this interval not displayed.   Cardiac Enzymes: No results for input(s): CKTOTAL, CKMB, CKMBINDEX, TROPONINI in the last 168 hours. CBG: Recent Labs  Lab 03/15/20 1241 03/15/20 1745 03/15/20 2023 03/16/20 1130 03/17/20 0823  GLUCAP 117* 123* 109* 155* 161*    Studies/Results: No results found. Medications:  . amLODipine  10 mg Oral Daily  . calcitRIOL  1.5 mcg Oral Q M,W,F-HD  . chlorhexidine  15 mL Mouth Rinse BID  . Chlorhexidine Gluconate Cloth  6 each Topical Q0600  . collagenase   Topical Daily  . darbepoetin (ARANESP) injection - DIALYSIS  150 mcg Intravenous Q Wed-HD  . doxycycline  100 mg Oral Q12H  . feeding supplement (NEPRO CARB STEADY)  237 mL Oral BID BM  . insulin aspart  0-5 Units Subcutaneous QHS  . insulin aspart  0-9 Units Subcutaneous TID WC  . mouth rinse  15 mL Mouth Rinse q12n4p  . multivitamin  1 tablet Oral QHS  .  pantoprazole  40 mg Oral Daily  . QUEtiapine  50 mg Oral BID  . thiamine  100 mg Oral Daily  . zinc sulfate  220 mg Oral Daily

## 2020-03-17 NOTE — TOC Progression Note (Signed)
Transition of Care (TOC) - Progression Note    Patient Details  Name: [name]. MRN: [id number] Date of Birth: 1950/06/27  Transition of Care [facility]) CM/SW Contact  Curlene Labrum, RN Phone Number: 03/17/2020, 2:19 PM  Clinical Narrative:    Case management is following patient regarding patient refusal to receive medications and hemodialysis as an inpatient at [facility].  I spoke with [facility] yesterday pending patients family meeting with palliative care today.  [facility] has an available bed for the patient to return to the facility as long as he is in agreement to receive HD treatments or goals of care are discussed with the patient and family.  The patient agreed to go to hemodialysis today.  Patient will be SNf placement to return to [employer] [place] or Hospice/ Palliative care admission after goals of care meeting today.  Case management will to continue to follow for transitions of care.   Expected Discharge Plan: Skilled Nursing Facility Barriers to Discharge: Continued Medical Work up  Expected Discharge Plan and Services Expected Discharge Plan: [place] In-house Referral: Clinical Social Work Discharge Planning Services: CM Consult Post Acute Care Choice: Durable Medical Equipment, Home Health Living arrangements for the past 2 months: Apartment                                       Social Determinants of Health (SDOH) Interventions    Readmission Risk Interventions Readmission Risk Prevention Plan 02/22/2020 01/18/2020  Transportation Screening Complete Complete  Medication Review Press photographer) Referral to Pharmacy Complete  PCP or Specialist appointment within 3-5 days of discharge Complete Complete  HRI or [place] Complete Complete  SW Recovery Care/Counseling Consult Complete Complete  Palliative Care Screening Not Applicable Complete  [place] Patient Refused  Complete  Some recent data might be hidden

## 2020-03-17 NOTE — Progress Notes (Signed)
Daily Progress Note   Patient Name: [name].       Date: 03/17/2020 DOB: 02/28/1951  Age: 69 y.o. MRN#: [id number] Attending Physician: Mariel Aloe, MD Primary Care Physician: [facility] Admit Date: 02/20/2020  Reason for Consultation/Follow-up: Establishing goals of care  Subjective: Patient awake, alert, oriented but irritable during family meeting. Frequently changes the subject.   GOC:  [place] meeting with patient's three daughters Joseph Art, [place], and [place]) and patient's sister and BIL Lannette Donath and [place]) at bedside.   Discussed course of hospitalization including diagnoses, interventions, plan of care. Explored patients reasons of skipping or stopping dialysis early including bothersome symptoms.  Patient continues to tell us it is because he is so cold and we don't understand how he feels.   Attempted to explain consequences and prognosis if he continues to refuse dialysis. That we wish to honor his decisions whatever that may be. Explained that he will need hospice services and he will pass within a few weeks if he continues to refuse dialysis. Patient continues to change the subject during discussion.   Daughters upset and tearful throughout discussion sharing that they cannot help him live if he doesn't want to help himself. They want their father to live and see his grandchildren grow up. Twin daughters have sickle cell and have been sick their entire lives. [name] and [place] share they understand how it feels to be sick and in and out of the hospital.   By the end of discussion, patient continues to refuse dialysis today but will not agree with plan for hospice. He becomes upset and tells daughters they can't tell him what to do.   **Outside  of room, discussed psych evaluation and determination that he does not have the capacity to make medical decisions. Psych recommended legal guardianship. Explained that at this point, knowing he does not have capacity to make decisions, all three daughters are default decision-makers. They spoke of making him go to dialysis. We discussed it will be very hard to force dialysis on him without physical or chemical restraints.   Daughters do seem to understand that if he continues to refuse dialysis, the only option is pursing hospice facility placement. Educated on hospice philosophy and EOL care with ESRD. No final decisions made today. Will allow daughters more time to  process this conversation. Renee has PMT contact information.    Length of Stay: 26  Current Medications: Scheduled Meds:   amLODipine  10 mg Oral Daily   calcitRIOL  1.5 mcg Oral Q M,W,F-HD   chlorhexidine  15 mL Mouth Rinse BID   Chlorhexidine Gluconate Cloth  6 each Topical Q0600   collagenase   Topical Daily   darbepoetin (ARANESP) injection - DIALYSIS  150 mcg Intravenous Q Wed-HD   doxycycline  100 mg Oral Q12H   feeding supplement (NEPRO CARB STEADY)  237 mL Oral BID BM   insulin aspart  0-5 Units Subcutaneous QHS   insulin aspart  0-9 Units Subcutaneous TID WC   mouth rinse  15 mL Mouth Rinse q12n4p   multivitamin  1 tablet Oral QHS   pantoprazole  40 mg Oral Daily   QUEtiapine  50 mg Oral BID   thiamine  100 mg Oral Daily   zinc sulfate  220 mg Oral Daily    Continuous Infusions:   PRN Meds: haloperidol lactate, lip balm, loperamide, metoprolol tartrate, [DISCONTINUED] ondansetron **OR** ondansetron (ZOFRAN) IV, Resource ThickenUp Clear  Physical Exam Vitals and nursing note reviewed.  Constitutional:      General: He is awake.  Pulmonary:     Effort: No tachypnea, accessory muscle usage or respiratory distress.  Skin:    General: Skin is warm and dry.  Neurological:     Mental Status:  He is alert and oriented to person, place, and time.  Psychiatric:     Comments: irritable            Vital Signs: BP (!) 148/81 (BP Location: Right Arm)    Pulse 98    Temp 98.2 F (36.8 C) (Oral)    Resp 17    Ht 6' (1.829 m)    Wt 82.7 kg    SpO2 100%    BMI 24.73 kg/m  SpO2: SpO2: 100 % O2 Device: O2 Device: Room Air O2 Flow Rate: O2 Flow Rate (L/min): 2 L/min  Intake/output summary:   Intake/Output Summary (Last 24 hours) at 03/17/2020 1505 Last data filed at 03/17/2020 1300 Gross per 24 hour  Intake 840 ml  Output 2 ml  Net 838 ml   LBM: Last BM Date: 03/13/20 Baseline Weight: Weight: 86.2 kg Most recent weight: Weight: 82.7 kg       Palliative Assessment/Data: PPS 50%    Flowsheet Rows     Most Recent Value  Intake Tab  Referral Department Hospitalist  Unit at Time of Referral ICU  Palliative Care Primary Diagnosis Sepsis/Infectious Disease  Date Notified 02/23/20  Palliative Care Type New Palliative care  Reason for referral Clarify Goals of Care  Date of Admission 02/20/20  Date first seen by Palliative Care 02/24/20  # of days Palliative referral response time 1 Day(s)  # of days IP prior to Palliative referral 3  Clinical Assessment  Palliative Performance Scale Score 50%  Pain Max last 24 hours Not able to report  Pain Min Last 24 hours Not able to report  Dyspnea Max Last 24 Hours Not able to report  Dyspnea Min Last 24 hours Not able to report  Psychosocial & Spiritual Assessment  Palliative Care Outcomes  Patient/Family meeting held? Yes  Who was at the meeting? patient, three daughters, sister, BIL  Palliative Care Outcomes Clarified goals of care, Counseled regarding hospice, Provided end of life care assistance, Provided psychosocial or spiritual support, ACP counseling assistance      Patient  Active Problem List   Diagnosis Date Noted   Hx of bacteremia    Goals of care, counseling/discussion    Palliative care by specialist    DNR  (do not resuscitate) discussion    [id number]    Acute metabolic encephalopathy    Hemodialysis patient ([place])    Bacteremia associated with intravascular line ([place])    MRSA bacteremia 01/05/2020   Epidural abscess 01/04/2020   Acute encephalopathy 01/04/2020   Anemia of chronic disease 02/11/2019   Type 2 diabetes mellitus with renal complication ([place]) 62/94/7654   ESRD (end stage renal disease) ([place])    Cardiac murmur 01/08/2019   Preoperative clearance 01/08/2019   Uncontrolled Type 2 DM 09/15/2013   HYPERTENSION 03/08/2013   CKD  06/24/2012   Hyperkalemia 06/24/2012   Colon cancer, status Post resection  05/11/2012   Malnutrition of moderate degree ([place]) 02/24/2012   Health care maintenance 11/27/2011   Obesity (BMI 30.0-34.9) 10/20/2011   Cataract, bilateral 08/29/2011   Hyperlipidemia 04/14/2006   MICROCYTIC ANEMIA 04/14/2006   TOBACCO ABUSE 04/14/2006   OBSTRUCTIVE SLEEP APNEA 04/14/2006    Palliative Care Assessment & Plan   Patient Profile: 69 y.o. male  with past medical history of HTN/HLD, ESRD on HD, DM 2, anemia of chronic disease, colon cancer SP resection, epidural abscess SP laminectomy with fusion 01/2020, MRSA bacteremia admitted on 02/20/2020 with Covid infection, acute metabolic encephalopathy likely in setting of sepsis due to COVID-19 infection, epidural abscess status post laminectomy and fusion August 2021, went to short-term rehab.    Assessment: ESRD on hemodialysis COVID-19 pneumonia HCAP Acute metabolic encephalopathy Osteomyelitis of skull base Septic shock Cirrhosis, hepatitis C DM type 2  Recommendations/Plan  Continue current plan of care and medical management.  Extensive discussion with patient and family today including three daughters. Patient continues to refuse dialysis today but does not understand the consequences of ongoing dialysis refusal. Daughters want him to live and patient speaks of wanting to live.    On 10/14, psych determined that he does not have capacity to make medical decisions and recommended legal guardianship. For now, three daughters are default POA's until guardianship is obtained.   Daughter do seem to understand that if he continues to refuse dialysis, comfort and hospice facility is the only option. They will be faced with this tough decision moving forward.   Discussed with Dr. Lonny Prude. Will allow daughters time to process this conversation today. PMT PA, Florentina Jenny will f/u with family tomorrow 10/16 to further discuss goals, code status, and hospice facility referral.   Code Status: FULL   Code Status Orders  (From admission, onward)         Start     Ordered   02/20/20 1629  Full code  Continuous        02/20/20 1630        Code Status History    Date Active Date Inactive Code Status Order ID Comments User Context   01/04/2020 2213 01/24/2020 1826 Full Code [id number]  Rise Patience, MD ED   02/12/2019 0001 02/12/2019 1809 Full Code [id number]  Rise Patience, MD Inpatient   04/03/2013 1902 04/04/2013 1714 Full Code [id number]  Clinton Gallant, MD Inpatient   09/02/2012 1337 09/08/2012 1316 Full Code [id number]  Maia Petties., MD Inpatient   03/30/2012 0250 04/06/2012 2116 Full Code [id number]  Trish Fountain, MD Inpatient   02/16/2012 1512 02/16/2012 1816 Full Code [id number]  Earnstine Regal, [facility]   02/06/2012 1514 02/09/2012 1624  Full Code [id number]  Peri Jefferson, RN Inpatient   Advance Care Planning Activity       Prognosis:   Guarded with medical non-compliance  Discharge Planning:  To Be Determined  Care plan was discussed with patient, multiple family members including daughters, mother, sister. Dr. Lonny Prude, RN, Florentina Jenny PMT PA  Thank you for allowing the Palliative Medicine Team to assist in the care of this patient.   Total Time 90 Prolonged Time Billed yes    Greater than 50% of this time was spent counseling and  coordinating care related to the above assessment and plan.   Ihor Dow, DNP, FNP-C Palliative Medicine Team  Phone: [phone] Fax: [phone]  Please contact Palliative Medicine Team phone at [phone] for questions and concerns.

## 2020-03-18 DIAGNOSIS — G9341 Metabolic encephalopathy: Secondary | ICD-10-CM | POA: Diagnosis not present

## 2020-03-18 DIAGNOSIS — N186 End stage renal disease: Secondary | ICD-10-CM | POA: Diagnosis not present

## 2020-03-18 DIAGNOSIS — I10 Essential (primary) hypertension: Secondary | ICD-10-CM | POA: Diagnosis not present

## 2020-03-18 DIAGNOSIS — E875 Hyperkalemia: Secondary | ICD-10-CM | POA: Diagnosis not present

## 2020-03-18 DIAGNOSIS — Z515 Encounter for palliative care: Secondary | ICD-10-CM | POA: Diagnosis not present

## 2020-03-18 LAB — GLUCOSE, CAPILLARY
Glucose-Capillary: 70 mg/dL (ref 70–99)
Glucose-Capillary: 108 mg/dL — ABNORMAL HIGH (ref 70–99)
Glucose-Capillary: 91 mg/dL (ref 70–99)
Glucose-Capillary: 96 mg/dL (ref 70–99)

## 2020-03-18 LAB — CBC
HCT: 25.4 % — ABNORMAL LOW (ref 39.0–52.0)
Hemoglobin: 8.4 g/dL — ABNORMAL LOW (ref 13.0–17.0)
MCH: 27.7 pg (ref 26.0–34.0)
MCHC: 33.1 g/dL (ref 30.0–36.0)
MCV: 83.8 fL (ref 80.0–100.0)
Platelets: 187 10*3/uL (ref 150–400)
RBC: 3.03 MIL/uL — ABNORMAL LOW (ref 4.22–5.81)
RDW: 18.6 % — ABNORMAL HIGH (ref 11.5–15.5)
WBC: 5 10*3/uL (ref 4.0–10.5)
nRBC: 0 % (ref 0.0–0.2)

## 2020-03-18 LAB — RENAL FUNCTION PANEL
Albumin: 2.1 g/dL — ABNORMAL LOW (ref 3.5–5.0)
Anion gap: 9 (ref 5–15)
BUN: 59 mg/dL — ABNORMAL HIGH (ref 8–23)
CO2: 19 mmol/L — ABNORMAL LOW (ref 22–32)
Calcium: 8.5 mg/dL — ABNORMAL LOW (ref 8.9–10.3)
Chloride: 108 mmol/L (ref 98–111)
Creatinine, Ser: 11.13 mg/dL — ABNORMAL HIGH (ref 0.61–1.24)
GFR, Estimated: 4 mL/min — ABNORMAL LOW (ref >60)
Glucose, Bld: 79 mg/dL (ref 70–99)
Phosphorus: 3.7 mg/dL (ref 2.5–4.6)
Potassium: 5.1 mmol/L (ref 3.5–5.1)
Sodium: 136 mmol/L (ref 135–145)

## 2020-03-18 NOTE — Progress Notes (Signed)
Pt continuous to get out of bed even with haldol and pain meds. He refused his vitals to be taken. He also refused bath, CHG rinse and wipes. He took most of his meds with encouragement and education. Pt needed to be reminded and educated not to get up constantly. Bed alarm was turned on.  Pt was able to request for pain meds whenever he needed one.

## 2020-03-18 NOTE — Progress Notes (Signed)
PROGRESS NOTE    [name].  TIR:[id number] DOB: 01/29/1951 DOA: 02/20/2020 PCP: [facility]   Brief Narrative: Daria [place] Rehan Holness. is a 69 y.o. malewith PMHx of ESRD on HD MWF, DM-2, MRSA bacteremia with epidural abscess s/p C4-T2 laminectomy/fusion on 01/06/2020-on IV vancomycin/rifampin through 10/1-resident of SNF-apparently tested positive for COVID-19 on 9/18-subsequently brought to the ED on 9/19 for worsening confusion (more than baseline)-fever-thought to have acute metabolic encephalopathy from COVID-19 infection and admitted to the hospitalist service. Further hospital course complicated by development of hypotension/severe encephalopathy requiring intubation and ICU transfer.   Significant Events: 8/3-8/23>>hospitalized for epidural abscess/MRSA bacteremia-underwent laminectomy-briefly intubated due to altered mental status. Discharged to SNF (prior admission) 9/18>> COVID-19 positive at SNF 9/19>> Admit to [facility] for fever/confusion 9/22>> obtunded/hypotensive after coming back from hemodialysis-emergently intubated/transferred to ICU 9/30>> transferred back to [facility] 9/30>> SQ heparin stopped-pharmacy consulted for HIT protocol.  Significant studies: 8/6>> Echo: EF> 75% 9/19>>Chest x-ray: No acute abnormality in the lung. 9/19>> CT head: No acute intracranial abnormality 9/19>> CT C-spine: Postoperative findings of posterior laminectomy/fusion C4-T1, prevertebral soft tissue edema appears reduced, postoperative fluid collection overlies laminectomy site does not appear significantly changed compared to prior examination. 9/20>> MRI C-spine: Attempted but due to AMS-not completed 9/21>> MRI C Spine: Osteomyelitis of skull base, anterior C1 and odontoid, C2-C3 facet joint-associated erosion of bone since 8/27-resolved epidural abscess 9/22>> coarsened/heterogeneous appearance of the liver parenchyma-suspicious for cirrhosis, gallbladder not  visualized. 9/22>> bilateral lower extremity Doppler: No DVT 9/29>> chest x-ray: Bibasilar infiltrates suggesting multifocal pneumonia   Assessment & Plan:   Principal Problem:   Acute metabolic encephalopathy Active Problems:   Hyperlipidemia   Hyperkalemia   HYPERTENSION   Anemia of chronic disease   Type 2 diabetes mellitus with renal complication (HCC)   ESRD (end stage renal disease) ([facility])   Epidural abscess   COVID-19   Hx of bacteremia   Goals of care, counseling/discussion   Palliative care by specialist   DNR (do not resuscitate) discussion   Acute respiratory failure with hypoxia Secondary to below. Patient required intubation and mechanical ventilation and was extubated on 9/26. Currently on room air.  COVID-19 pneumonia HCAP Sputum culture significant for MSSA and E. Coli. He was treated with Cefazolin for bacterial pneumonia in addition to Remdesivir. Remdesivir treatment was discontinued secondary to significant rise in LFTs. Patient has completed quarantine period.  Acute metabolic encephalopathy Likely multifactorial in setting of uremia vs sepsis. Appears to be resolved. Negative CT head.  Osteomyelitis of skull base Neurosurgery consulted with no recommendations for surgical management at this time. Patient was managed on Vancomycin and Rifampin, however this was switched to doxycycline secondary to elevated LFTs. Currently on Doxycycline with recommendations for follow-up at the ID clinic 3-4 weeks after discharge in addition to neurosurgery follow-up. -Continue Doxycycline  Septic shock In setting of pneumonia. Required vasopressor support in the ICU which is now resolved.  Dysphagia -Continue dysphagia 3 diet  Thrombocytopenia Mild. Stable.  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 Patient is refusing HD. Had discussion with  patient for which he states he does not like having HD at [facility] secondary to the HD unit being cold. He understands that not having HD will likely lead to his death and instead will wait until he can discharge to resume HD. Per psychiatry, patient does not have capacity, however this does  not address the issue of patient needing to be compliant with HD for safety. -Palliative care recommendations: planning repeat family meeting this afternoon -HD per nephrology if/when able/allowed by patient  Essential hypertension -Continue amlodipine  Hyperlipidemia Patient is on Lipitor as an outpatient which was held secondary to elevated LFTs this admission.  Diabetes mellitus, type 2 Hemoglobin A1C of 6.5%. Patient is on Levemir 11 units BID as an outpatient. -Continue SSI  Pressure injury Right/left buttock, not present on admission   DVT prophylaxis: SCDs Code Status:   Code Status: DNR Family Communication: None at bedside. Disposition Plan: Discharge to SNF when bed available and pending goals of care discussions since patient is currently declining HD inpatient; possibly discharge to hospice pending on goals of care   Consultants:   PCCM  Nephrology  Neurosurgery  Palliative care medicine  Procedures:     Antimicrobials:      Subjective: Patient states that he will do HD today, but looking at nephrology note, appears he ultimately declined. No other concerns.  Objective: Vitals:   03/17/20 0915 03/17/20 2020 03/18/20 0500 03/18/20 0544  BP:      Pulse:  86  82  Resp:  18    Temp: 98.2 F (36.8 C)   98 F (36.7 C)  TempSrc: Oral   Oral  SpO2:  100%  100%  Weight:   81.5 kg   Height:        Intake/Output Summary (Last 24 hours) at 03/18/2020 1417 Last data filed at 03/17/2020 1700 Gross per 24 hour  Intake 240 ml  Output --  Net 240 ml   Filed Weights   03/11/20 1356 03/14/20 1347 03/18/20 0500  Weight: 85.3 kg 82.7 kg 81.5 kg    Examination:  General  exam: Appears calm and comfortable Respiratory system: Clear to auscultation. Respiratory effort normal. Cardiovascular system: S1 & S2 heard, RRR. Gastrointestinal system: Abdomen is nondistended, soft and nontender. No organomegaly or masses felt. Normal bowel sounds heard. Central nervous system: Alert and oriented to person and place. Musculoskeletal: No edema. No calf tenderness Skin: No cyanosis. No rashes Psychiatry: Judgement and insight appear impaired.   Data Reviewed: I have personally reviewed following labs and imaging studies  CBC Lab Results  Component Value Date   WBC 5.0 03/18/2020   RBC 3.03 (L) 03/18/2020   HGB 8.4 (L) 03/18/2020   HCT 25.4 (L) 03/18/2020   MCV 83.8 03/18/2020   MCH 27.7 03/18/2020   PLT 187 03/18/2020   MCHC 33.1 03/18/2020   RDW 18.6 (H) 03/18/2020   LYMPHSABS 1.0 02/24/2020   MONOABS 0.2 02/24/2020   EOSABS 0.0 02/24/2020   BASOSABS 0.0 95/62/1308     Last metabolic panel Lab Results  Component Value Date   NA 136 03/18/2020   K 5.1 03/18/2020   CL 108 03/18/2020   CO2 19 (L) 03/18/2020   BUN 59 (H) 03/18/2020   CREATININE 11.13 (H) 03/18/2020   GLUCOSE 79 03/18/2020   GFRNONAA 4 (L) 03/18/2020   GFRAA 9 (L) 03/05/2020   CALCIUM 8.5 (L) 03/18/2020   PHOS 3.7 03/18/2020   PROT 6.1 (L) 03/05/2020   ALBUMIN 2.1 (L) 03/18/2020   BILITOT 1.3 (H) 03/05/2020   ALKPHOS 93 03/05/2020   AST 30 03/05/2020   ALT 9 03/05/2020   ANIONGAP 9 03/18/2020    CBG (last 3)  Recent Labs    03/17/20 2116 03/18/20 0659 03/18/20 1139  GLUCAP 103* 70 108*     GFR: Estimated Creatinine  Clearance: 7 mL/min (A) (by C-G formula based on SCr of 11.13 mg/dL (H)).  Coagulation Profile: No results for input(s): INR, PROTIME in the last 168 hours.  No results found for this or any previous visit (from the past 240 hour(s)).      Radiology Studies: No results found.      Scheduled Meds:  amLODipine  10 mg Oral Daily   calcitRIOL   1.5 mcg Oral Q M,W,F-HD   chlorhexidine  15 mL Mouth Rinse BID   Chlorhexidine Gluconate Cloth  6 each Topical Q0600   collagenase   Topical Daily   darbepoetin (ARANESP) injection - DIALYSIS  150 mcg Intravenous Q Wed-HD   doxycycline  100 mg Oral Q12H   feeding supplement (NEPRO CARB STEADY)  237 mL Oral BID BM   insulin aspart  0-5 Units Subcutaneous QHS   insulin aspart  0-9 Units Subcutaneous TID WC   mouth rinse  15 mL Mouth Rinse q12n4p   multivitamin  1 tablet Oral QHS   pantoprazole  40 mg Oral Daily   QUEtiapine  50 mg Oral BID   thiamine  100 mg Oral Daily   zinc sulfate  220 mg Oral Daily   Continuous Infusions:   LOS: 27 days     Cordelia Poche, MD [facility] 03/18/2020, 2:17 PM  If 7PM-7AM, please contact night-coverage [email or web address]

## 2020-03-18 NOTE — Progress Notes (Signed)
Subjective: Seen in room, refused dialysis last night and this morning.  Noted daughters request for dialysis in room today that is not feasible with staffing and also does not relate outpatient dialysis also  Objective Vital signs in last 24 hours: Vitals:   03/17/20 0915 03/17/20 2020 03/18/20 0500 03/18/20 0544  BP:      Pulse:  86  82  Resp:  18    Temp: 98.2 F (36.8 C)   98 F (36.7 C)  TempSrc: Oral   Oral  SpO2:  100%  100%  Weight:   81.5 kg   Height:       Weight change:   Physical Exam: General: Alert elderly male sitting upright on  bedside / NAD/ pleasantly confused Heart: RRR, no MRG Lungs: CTA, nonlabored, noted 100% O2 sat room air Abdomen: Bowel sounds normoactive, soft NTND, no ascites Extremities:no  Pedal edema Dialysis access; right IJ PermCath dressing off site nontender no discharge, left upper arm AV fistula positive bruit  Last CXR 9/23 - patchy perihilar/ basilar dz, no edema   OP HD:[facility] MWF 4h 54min 87kg 2/2 bath TDC / L AVF Hep none  -Mircera 150 mcg IV q 2 weeks (last dose 02/09/2020) -Venofer 100 mg IV X 8 doses (not started yet) -Calcitriol 1.5 mcg PO TIW  Problem/Plan: 1.  ESRD - MWF HD.  Last HD Saturday, 03/11/2020, refused HD yesterday, noted Tuesday tried to pull needles out in unit a.m. labs K5.1 CO2 19 BUN 59 CR 11.13, volume and O2 sat okay today  continues to refuse hemodialysis., if he changes his mind use PermCath because the risk of pulling lines out with AMS /HIT testing was negative. OK to use heparin to block catheter. 2. AMS -this a.m. no change with impaired judgment and insight, he continues to refuse treatments, including dialysis.  Per  Psychiatry 03/06/20  suggesting guardianship with daughter/family.  Diagnosis =''patient does not have the mental capacity to make an informed decision, patient does not require inpatient Geri psych care '' 3. HD access - L BC AVF placed Feb 2021. Was used starting in May and had  infiltration in Aug prompting [facility] placement to rest the AVF. Have used AVF w/o issues on last 3 HD sessions. TDC would be ready for removal, but due to lack of good IV access have kept in for now.  4. Hypertension/volume - Previously on high dose midodrine here and this is now off. Keep even on HD, has not tolerated UF here lately.  5. COVID PNA/ HCAP (EColi/ MSSA resp culture): Tested positive at [facility] 09/18 and again here. Got IV cefazolin thru 10/1. Remdesivir stopped 9/22 d/t ^LFT's. COVID isolation ended 03/12/20.  6. Acute hypoxic resp failure. At one point required mechanical ventilation. Resolved.  7. Recent epidural abscess/MRSA cervical discitis- S/p decompression surgery C4-T2 on 01/06/20 during prior admission. Pt admitted this time on 9/19 w/ AMS. 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 NSurg recommended no further surgery. Plans w/ ID were for Vancomycin and Rifampin here through 10/01 but rifampin dc'd early due to ^LFT"s. PO doxy started on 10/01 and pt is now off IV vanc. Will f/u w/ ID clinic in 3-4 wks, they will guide on the stop date of the po doxy. 8. Anemia -Hgb 8.4 Aranesp increased to 150 mcg for 9/29 onward for now. Hold OP Fe load secondary infection.s/p PRBC's 9/29 9. Metabolic bone disease - off renvela due to hypophosphatemia, phosphorus 4.9 > 3.7  10/16 , on calcitriol with dialysis - corrected calcium= 10. 0 continue to follow lab trend 10. DM-per primary 11. Low plts - 2nd level testing for HIT was negative. resolved platelet count 187 this morning 12. H/o HCV/ liver cirrhosis - hep C testing here was negative. Per pmd will need GI f/u in OP setting.  Dispo - awaiting SNF placement. Has been refusing dialysisthis week. Palliative care being as above with GOC noted 3 daughters working with palliative team/ patient is DNR now  Rodney Haber, PA-C [place] [phone] 03/18/2020,10:27 AM  LOS: 27 days    Labs: Basic Metabolic Panel: Recent Labs  Lab 03/14/20 1500 03/18/20 0148  NA 135 136  K 4.5 5.1  CL 107 108  CO2 19* 19*  GLUCOSE 134* 79  BUN 38* 59*  CREATININE 10.40* 11.13*  CALCIUM 7.7* 8.5*  PHOS 4.9* 3.7   Liver Function Tests: Recent Labs  Lab 03/14/20 1500 03/18/20 0148  ALBUMIN 1.9* 2.1*   No results for input(s): LIPASE, AMYLASE in the last 168 hours. No results for input(s): AMMONIA in the last 168 hours. CBC: Recent Labs  Lab 03/12/20 1747 03/14/20 1500 03/18/20 0148  WBC 6.6 4.7 5.0  HGB 8.4* 7.7* 8.4*  HCT 25.9* 24.4* 25.4*  MCV 86.0 84.7 83.8  PLT 154 146* 187   Cardiac Enzymes: No results for input(s): CKTOTAL, CKMB, CKMBINDEX, TROPONINI in the last 168 hours. CBG: Recent Labs  Lab 03/17/20 0823 03/17/20 1153 03/17/20 1610 03/17/20 2116 03/18/20 0659  GLUCAP 161* 101* 112* 103* 70    Studies/Results: No results found. Medications:  . amLODipine  10 mg Oral Daily  . calcitRIOL  1.5 mcg Oral Q M,W,F-HD  . chlorhexidine  15 mL Mouth Rinse BID  . Chlorhexidine Gluconate Cloth  6 each Topical Q0600  . collagenase   Topical Daily  . darbepoetin (ARANESP) injection - DIALYSIS  150 mcg Intravenous Q Wed-HD  . doxycycline  100 mg Oral Q12H  . feeding supplement (NEPRO CARB STEADY)  237 mL Oral BID BM  . insulin aspart  0-5 Units Subcutaneous QHS  . insulin aspart  0-9 Units Subcutaneous TID WC  . mouth rinse  15 mL Mouth Rinse q12n4p  . multivitamin  1 tablet Oral QHS  . pantoprazole  40 mg Oral Daily  . QUEtiapine  50 mg Oral BID  . thiamine  100 mg Oral Daily  . zinc sulfate  220 mg Oral Daily

## 2020-03-18 NOTE — Plan of Care (Signed)
  Problem: Education: Goal: Knowledge of General Education information will improve Description: Including pain rating scale, medication(s)/side effects and non-pharmacologic comfort measures Outcome: Progressing   Problem: Health Behavior/Discharge Planning: Goal: Ability to manage health-related needs will improve Outcome: Progressing   Problem: Clinical Measurements: Goal: Ability to maintain clinical measurements within normal limits will improve Outcome: Progressing Goal: Will remain free from infection Outcome: Progressing Goal: Diagnostic test results will improve Outcome: Progressing Goal: Respiratory complications will improve Outcome: Progressing Goal: Cardiovascular complication will be avoided Outcome: Progressing   Problem: Activity: Goal: Risk for activity intolerance will decrease Outcome: Progressing   Problem: Nutrition: Goal: Adequate nutrition will be maintained Outcome: Progressing   Problem: Coping: Goal: Level of anxiety will decrease Outcome: Progressing   Problem: Elimination: Goal: Will not experience complications related to bowel motility Outcome: Progressing Goal: Will not experience complications related to urinary retention Outcome: Progressing   Problem: Pain Managment: Goal: General experience of comfort will improve Outcome: Progressing   Problem: Safety: Goal: Ability to remain free from injury will improve Outcome: Progressing   Problem: Skin Integrity: Goal: Risk for impaired skin integrity will decrease Outcome: Progressing   Problem: Education: Goal: Knowledge of risk factors and measures for prevention of condition will improve Outcome: Progressing   Problem: Coping: Goal: Psychosocial and spiritual needs will be supported Outcome: Progressing   Problem: Respiratory: Goal: Will maintain a patent airway Outcome: Progressing Goal: Complications related to the disease process, condition or treatment will be avoided or  minimized Outcome: Progressing   Problem: Education: Goal: Knowledge of disease and its progression will improve Outcome: Progressing Goal: Individualized Educational Video(s) Outcome: Progressing   Problem: Fluid Volume: Goal: Compliance with measures to maintain balanced fluid volume will improve Outcome: Progressing   Problem: Health Behavior/Discharge Planning: Goal: Ability to manage health-related needs will improve Outcome: Progressing   Problem: Nutritional: Goal: Ability to make healthy dietary choices will improve Outcome: Progressing   Problem: Clinical Measurements: Goal: Complications related to the disease process, condition or treatment will be avoided or minimized Outcome: Progressing

## 2020-03-18 NOTE — Progress Notes (Signed)
The patient has a Air cabin crew at the bedside. He is noncompliant and will attempt to slid out of bed.  The patient has been educated in regards to safety and fall precautions.  He verbalizes understanding those Instructions.

## 2020-03-18 NOTE — Progress Notes (Signed)
Daily Progress Note   Patient Name: [name].       Date: 03/18/2020 DOB: Jul 27, 1950  Age: 69 y.o. MRN#: [id number] Attending Physician: Mariel Aloe, MD Primary Care Physician: [facility] Admit Date: 02/20/2020  Reason for Consultation/Follow-up:To discuss complex medical decision making related to patient's goals of care  Read chart, discussed with Dr. Lonny Prude, examined patient at bedside.     Subjective: Spoke with [name] and Renee on the phone.  Attempted to conference in [place] but was unable to reach her.  Explained that Mr. Stailey refused HD last night.  Explained the conundrum that refusing HD yet remaining a full code causes.  These goals are not compatible.  Explained the necessity to change code status to DNR.  Renee and Monique understand and accept the reason to change to DNR.  They are comfortable explaining it to [place].   Joseph Art tells me she laid in bed last night and thought of 1 last thing to try.  She feels that her father's hospital room is very warm and perhaps he would take HD if he could receive it in his room.  We talked about the fact that this would not help him outside of the hospital.  I suggested that perhaps there is more to it that Mr. Ortwein just being cold.  Hemodialysis can be very hard on the body - it can cause cramping, fatigue, pain.  Perhaps being cold is just all Mr. Winkles can express but there may be more to it.  The daughters plan to visit their father later today and would be very grateful if the hospital would agree to attempt HD in Mr. Frisk room.  This would allow his daughters to at least ask him if he would take it in his room.   Assessment: Patient confused and sleepy.  Sitter at bedside explains he was  agitated this morning and has recently calmed down to rest.   Patient Profile/HPI:  69 y.o.malewith past medical history of HTN/HLD, ESRD on HD, DM 2, anemia of chronic disease, colon cancer SP resection, epidural abscess SP laminectomy with fusion 01/2020,MRSA bacteremiaadmitted on 9/19/2021with Covid infection, acute metabolic encephalopathy likely in setting of sepsis due to COVID-19 infection, epidural abscess status post laminectomy and fusion August 2021, went to short-term rehab.   Length of  Stay: 27   Vital Signs: BP (!) 148/81 (BP Location: Right Arm)   Pulse 82   Temp 98 F (36.7 C) (Oral)   Resp 18   Ht 6' (1.829 m)   Wt 81.5 kg   SpO2 100%   BMI 24.36 kg/m  SpO2: SpO2: 100 % O2 Device: O2 Device: Room Air O2 Flow Rate: O2 Flow Rate (L/min): 2 L/min       Palliative Assessment/Data:   30 - 40%     Palliative Care Plan    Recommendations/Plan:  Changed code status to DNR as patient refuses HD.  Daughters request that Hospital consider allowing him to dialyze in his room ([facility] attempt)  Plan to meet with daughters later today.  If he still will not dialyze will recommend Hospice Facility.  Code Status:  DNR  Prognosis:   < 2 weeks if he continues to refuse HD.   Discharge Planning:  To Be Determined  Care plan was discussed with Dr. Lonny Prude and family.  Thank you for allowing the Palliative Medicine Team to assist in the care of this patient.  Total time spent:  35 min.     Greater than 50%  of this time was spent counseling and coordinating care related to the above assessment and plan.  Florentina Jenny, PA-C Palliative Medicine  Please contact Palliative MedicineTeam phone at [phone] for questions and concerns between 7 am - 7 pm.   Please see AMION for individual provider pager numbers.

## 2020-03-18 NOTE — Progress Notes (Signed)
20:42 pt refused HD tx.

## 2020-03-19 DIAGNOSIS — Z515 Encounter for palliative care: Secondary | ICD-10-CM

## 2020-03-19 DIAGNOSIS — G9341 Metabolic encephalopathy: Secondary | ICD-10-CM | POA: Diagnosis not present

## 2020-03-19 DIAGNOSIS — I10 Essential (primary) hypertension: Secondary | ICD-10-CM | POA: Diagnosis not present

## 2020-03-19 DIAGNOSIS — E875 Hyperkalemia: Secondary | ICD-10-CM | POA: Diagnosis not present

## 2020-03-19 DIAGNOSIS — N186 End stage renal disease: Secondary | ICD-10-CM | POA: Diagnosis not present

## 2020-03-19 LAB — GLUCOSE, CAPILLARY
Glucose-Capillary: 102 mg/dL — ABNORMAL HIGH (ref 70–99)
Glucose-Capillary: 69 mg/dL — ABNORMAL LOW (ref 70–99)
Glucose-Capillary: 72 mg/dL (ref 70–99)
Glucose-Capillary: 88 mg/dL (ref 70–99)
Glucose-Capillary: 95 mg/dL (ref 70–99)

## 2020-03-19 LAB — BASIC METABOLIC PANEL
Anion gap: 8 (ref 5–15)
BUN: 68 mg/dL — ABNORMAL HIGH (ref 8–23)
CO2: 19 mmol/L — ABNORMAL LOW (ref 22–32)
Calcium: 8.6 mg/dL — ABNORMAL LOW (ref 8.9–10.3)
Chloride: 105 mmol/L (ref 98–111)
Creatinine, Ser: 12.18 mg/dL — ABNORMAL HIGH (ref 0.61–1.24)
GFR, Estimated: 4 mL/min — ABNORMAL LOW (ref >60)
Glucose, Bld: 129 mg/dL — ABNORMAL HIGH (ref 70–99)
Potassium: 6.4 mmol/L (ref 3.5–5.1)
Sodium: 132 mmol/L — ABNORMAL LOW (ref 135–145)

## 2020-03-19 MED ORDER — HYDROMORPHONE HCL 2 MG PO TABS: 2.0000 mg | ORAL_TABLET | ORAL | Status: DC | PRN

## 2020-03-19 MED ORDER — SODIUM ZIRCONIUM CYCLOSILICATE 10 G PO PACK
10.0000 g | PACK | Freq: Three times a day (TID) | ORAL | Status: DC
  Administered 2020-03-19: 10 g via ORAL
  Filled 2020-03-19 (×3): qty 1

## 2020-03-19 MED ORDER — HYDROMORPHONE HCL 1 MG/ML IJ SOLN: 0.2500 mg | INTRAMUSCULAR | Status: DC | PRN

## 2020-03-19 MED ORDER — SODIUM POLYSTYRENE SULFONATE 15 GM/60ML PO SUSP
30.0000 g | ORAL | Status: AC
  Administered 2020-03-19: 30 g via ORAL
  Filled 2020-03-19 (×2): qty 120

## 2020-03-19 MED ORDER — DEXTROSE 50 % IV SOLN: 25.0000 mL | Freq: Once | INTRAVENOUS | Status: DC

## 2020-03-19 MED ORDER — GLUCAGON HCL RDNA (DIAGNOSTIC) 1 MG IJ SOLR
1.0000 mg | INTRAMUSCULAR | Status: DC
  Filled 2020-03-19: qty 1

## 2020-03-19 NOTE — Progress Notes (Addendum)
Daily Progress Note   Patient Name: [name].       Date: 03/19/2020 DOB: 04/25/1951  Age: 69 y.o. MRN#: [id number] Attending Physician: Mariel Aloe, MD Primary Care Physician: [facility] Admit Date: 02/20/2020  Reason for Consultation/Follow-up:  To discuss complex medical decision making related to patient's goals of care.  Visited patient in the room.  He has multiple heated blankets covering him.  His K++ is 6.4.  Rn and Tech are encouraging him to drink Kayexalate.  Subjective: Patient complains of stomach pain.  He states he just wants to be left alone.  I ask if he would feel better if he had a bowel movement.  He says he may and drinks some of the Kayexalate.  He asks to be left alone.   Left voicemail update for [facility].  Was successful in reaching [place] and was able to tell her about our concerns over his elevated potassium level.  She was appreciative of the information.  Assessment: Very concerned about hyperkalemia.  Patient has been refusing HD.  He is eating small amounts   Patient Profile/HPI:  69 y.o.malewith past medical history of HTN/HLD, ESRD on HD, DM 2, anemia of chronic disease, colon cancer SP resection, epidural abscess SP laminectomy with fusion 01/2020,MRSA bacteremiaadmitted on 9/19/2021with Covid infection, acute metabolic encephalopathy likely in setting of sepsis due to COVID-19 infection, epidural abscess status post laminectomy and fusion August 2021, went to short-term rehab.    Length of Stay: 28   Vital Signs: BP (!) 160/72 (BP Location: Right Arm)   Pulse 76   Temp 98 F (36.7 C) (Oral)   Resp 18   Ht 6' (1.829 m)   Wt 84.4 kg   SpO2 98%   BMI 25.23 kg/m  SpO2: SpO2: 98 % O2 Device: O2 Device: Room  Air O2 Flow Rate: O2 Flow Rate (L/min): 2 L/min       Palliative Assessment/Data: 40%     Palliative Care Plan    Recommendations/Plan:  PMT will continue to follow with you.  Can not force HD if patient refuses.  Family planning to be present in the room tomorrow when its time for HD.  DNR.  Hyperkalemia concerning.  I ordered a very small prn dose of pain medication given abdominal pain.  Code Status:  DNR  Prognosis:   very difficulty to determine.  His time will be very short if he does not dialyze and he will need Hospice House.  Discharge Planning:  To Be Determined  Care plan was discussed with beside RN.  Thank you for allowing the Palliative Medicine Team to assist in the care of this patient.  Total time spent:  35 min.     Greater than 50%  of this time was spent counseling and coordinating care related to the above assessment and plan.  Florentina Jenny, PA-C Palliative Medicine  Please contact Palliative MedicineTeam phone at [phone] for questions and concerns between 7 am - 7 pm.   Please see AMION for individual provider pager numbers.

## 2020-03-19 NOTE — Progress Notes (Signed)
PROGRESS NOTE    [name].  [name]:[id number] DOB: Feb 26, 1951 DOA: 02/20/2020 PCP: [facility]   Brief Narrative: Rodney Pastures [name] is a 69 y.o. malewith PMHx of ESRD on HD MWF, DM-2, MRSA bacteremia with epidural abscess s/p C4-T2 laminectomy/fusion on 01/06/2020-on IV vancomycin/rifampin through 10/1-resident of SNF-apparently tested positive for COVID-19 on 9/18-subsequently brought to the ED on 9/19 for worsening confusion (more than baseline)-fever-thought to have acute metabolic encephalopathy from COVID-19 infection and admitted to the hospitalist service. Further hospital course complicated by development of hypotension/severe encephalopathy requiring intubation and ICU transfer.   Significant Events: 8/3-8/23>>hospitalized for epidural abscess/MRSA bacteremia-underwent laminectomy-briefly intubated due to altered mental status. Discharged to SNF (prior admission) 9/18>> COVID-19 positive at SNF 9/19>> Admit to [facility] for fever/confusion 9/22>> obtunded/hypotensive after coming back from hemodialysis-emergently intubated/transferred to ICU 9/30>> transferred back to [facility] 9/30>> SQ heparin stopped-pharmacy consulted for HIT protocol.  Significant studies: 8/6>> Echo: EF> 75% 9/19>>Chest x-ray: No acute abnormality in the lung. 9/19>> CT head: No acute intracranial abnormality 9/19>> CT C-spine: Postoperative findings of posterior laminectomy/fusion C4-T1, prevertebral soft tissue edema appears reduced, postoperative fluid collection overlies laminectomy site does not appear significantly changed compared to prior examination. 9/20>> MRI C-spine: Attempted but due to AMS-not completed 9/21>> MRI C Spine: Osteomyelitis of skull base, anterior C1 and odontoid, C2-C3 facet joint-associated erosion of bone since 8/27-resolved epidural abscess 9/22>> coarsened/heterogeneous appearance of the liver parenchyma-suspicious for cirrhosis, gallbladder not  visualized. 9/22>> bilateral lower extremity Doppler: No DVT 9/29>> chest x-ray: Bibasilar infiltrates suggesting multifocal pneumonia   Assessment & Plan:   Principal Problem:   Acute metabolic encephalopathy Active Problems:   Hyperlipidemia   Hyperkalemia   HYPERTENSION   Anemia of chronic disease   Type 2 diabetes mellitus with renal complication (HCC)   ESRD (end stage renal disease) (HCC)   Epidural abscess   COVID-19   Hx of bacteremia   Goals of care, counseling/discussion   Palliative care by specialist   DNR (do not resuscitate) discussion   Acute respiratory failure with hypoxia Secondary to below. Patient required intubation and mechanical ventilation and was extubated on 9/26. Currently on room air.  COVID-19 pneumonia HCAP Sputum culture significant for MSSA and E. Coli. He was treated with Cefazolin for bacterial pneumonia in addition to Remdesivir. Remdesivir treatment was discontinued secondary to significant rise in LFTs. Patient has completed quarantine period.  Acute metabolic encephalopathy Likely multifactorial in setting of uremia vs sepsis. Appears to be resolved. Negative CT head.  Osteomyelitis of skull base Neurosurgery consulted with no recommendations for surgical management at this time. Patient was managed on Vancomycin and Rifampin, however this was switched to doxycycline secondary to elevated LFTs. Currently on Doxycycline with recommendations for follow-up at the ID clinic 3-4 weeks after discharge in addition to neurosurgery follow-up. -Continue Doxycycline  Septic shock In setting of pneumonia. Required vasopressor support in the ICU which is now resolved.  Dysphagia -Continue dysphagia 3 diet  Thrombocytopenia Mild. Stable.  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 Patient is refusing HD. Had discussion with  patient for which he states he does not like having HD at [facility] secondary to the HD unit being cold. He understands that not having HD will likely lead to his death and instead will wait until he can discharge to resume HD. Per psychiatry, patient does not have capacity, however this does  not address the issue of patient needing to be compliant with HD for safety. -Palliative care recommendations: planning repeat family meeting this afternoon -HD per nephrology if/when able/allowed by patient  Essential hypertension -Continue amlodipine  Hyperkalemia Secondary to HD non-adherence. -Nephrology management -Telemetry  Hyperlipidemia Patient is on Lipitor as an outpatient which was held secondary to elevated LFTs this admission.  Diabetes mellitus, type 2 Hemoglobin A1C of 6.5%. Patient is on Levemir 11 units BID as an outpatient. -Continue SSI  Pressure injury Right/left buttock, not present on admission   DVT prophylaxis: SCDs Code Status:   Code Status: DNR Family Communication: None at bedside. Disposition Plan: Discharge to SNF when bed available and pending goals of care discussions since patient is currently declining HD inpatient; possibly discharge to hospice pending on goals of care   Consultants:   PCCM  Nephrology  Neurosurgery  Palliative care medicine  Procedures:     Antimicrobials:      Subjective: No concerns today  Objective: Vitals:   03/18/20 2020 03/19/20 0500 03/19/20 0506 03/19/20 0700  BP:    (!) 160/72  Pulse: 89  96 76  Resp: 18  18 18   Temp:      TempSrc:      SpO2: 100%  95% 98%  Weight:  84.4 kg    Height:        Intake/Output Summary (Last 24 hours) at 03/19/2020 1305 Last data filed at 03/19/2020 0857 Gross per 24 hour  Intake 480 ml  Output --  Net 480 ml   Filed Weights   03/14/20 1347 03/18/20 0500 03/19/20 0500  Weight: 82.7 kg 81.5 kg 84.4 kg    Examination: General exam: Appears calm and  comfortable Respiratory system: Clear to auscultation. Respiratory effort normal. Cardiovascular system: S1 & S2 heard, RRR. No murmurs, rubs, gallops or clicks. Gastrointestinal system: Abdomen is nondistended, soft and nontender. No organomegaly or masses felt. Normal bowel sounds heard. Central nervous system: Alert and oriented. No focal neurological deficits. Musculoskeletal: No edema. No calf tenderness Skin: No cyanosis. No rashes Psychiatry: Easily agitated   Data Reviewed: I have personally reviewed following labs and imaging studies  CBC Lab Results  Component Value Date   WBC 5.0 03/18/2020   RBC 3.03 (L) 03/18/2020   HGB 8.4 (L) 03/18/2020   HCT 25.4 (L) 03/18/2020   MCV 83.8 03/18/2020   MCH 27.7 03/18/2020   PLT 187 03/18/2020   MCHC 33.1 03/18/2020   RDW 18.6 (H) 03/18/2020   LYMPHSABS 1.0 02/24/2020   MONOABS 0.2 02/24/2020   EOSABS 0.0 02/24/2020   BASOSABS 0.0 62/13/0865     Last metabolic panel Lab Results  Component Value Date   NA 132 (L) 03/19/2020   K 6.4 (HH) 03/19/2020   CL 105 03/19/2020   CO2 19 (L) 03/19/2020   BUN 68 (H) 03/19/2020   CREATININE 12.18 (H) 03/19/2020   GLUCOSE 129 (H) 03/19/2020   GFRNONAA 4 (L) 03/19/2020   GFRAA 9 (L) 03/05/2020   CALCIUM 8.6 (L) 03/19/2020   PHOS 3.7 03/18/2020   PROT 6.1 (L) 03/05/2020   ALBUMIN 2.1 (L) 03/18/2020   BILITOT 1.3 (H) 03/05/2020   ALKPHOS 93 03/05/2020   AST 30 03/05/2020   ALT 9 03/05/2020   ANIONGAP 8 03/19/2020    CBG (last 3)  Recent Labs    03/18/20 2025 03/19/20 0644 03/19/20 1210  GLUCAP 96 72 102*     GFR: Estimated Creatinine Clearance: 6.4 mL/min (A) (by C-G formula based  on SCr of 12.18 mg/dL (H)).  Coagulation Profile: No results for input(s): INR, PROTIME in the last 168 hours.  No results found for this or any previous visit (from the past 240 hour(s)).      Radiology Studies: No results found.      Scheduled Meds: . amLODipine  10 mg Oral  Daily  . calcitRIOL  1.5 mcg Oral Q M,W,F-HD  . chlorhexidine  15 mL Mouth Rinse BID  . Chlorhexidine Gluconate Cloth  6 each Topical Q0600  . collagenase   Topical Daily  . darbepoetin (ARANESP) injection - DIALYSIS  150 mcg Intravenous Q Wed-HD  . doxycycline  100 mg Oral Q12H  . feeding supplement (NEPRO CARB STEADY)  237 mL Oral BID BM  . insulin aspart  0-5 Units Subcutaneous QHS  . insulin aspart  0-9 Units Subcutaneous TID WC  . mouth rinse  15 mL Mouth Rinse q12n4p  . multivitamin  1 tablet Oral QHS  . pantoprazole  40 mg Oral Daily  . QUEtiapine  50 mg Oral BID  . sodium polystyrene  30 g Oral STAT  . sodium zirconium cyclosilicate  10 g Oral TID  . thiamine  100 mg Oral Daily  . zinc sulfate  220 mg Oral Daily   Continuous Infusions:   LOS: 28 days     Cordelia Poche, MD [facility] 03/19/2020, 1:05 PM  If 7PM-7AM, please contact night-coverage [email or web address]

## 2020-03-19 NOTE — Progress Notes (Signed)
Patient was able to take lokelma p.o which he had refused earlier. Periorbital edema more noticeable. Pt appears calm and pleasantly confused at this time.

## 2020-03-19 NOTE — Progress Notes (Signed)
Hypoglycemic Event  CBG: 69  Treatment: Glucagon IM on hold. Patient allowed nursing to give him orange juice. Patient finished 4 oz of orange juice.  Symptoms: feeling cold  Follow-up CBG: Time:2335 CBG Result:95  Possible Reasons for Event: refuses to eat  Comments/MD notified:X. [place].     [name] Lyn A

## 2020-03-19 NOTE — Progress Notes (Signed)
Pt alert and calm. Pt's allowed RN to change dressing.Pt took most of his meds. Pt continued to get out bed. RN encouraged and educated him not to.Pt was able to request for pain meds.

## 2020-03-19 NOTE — Progress Notes (Signed)
Florentina Jenny, PA Palliative Care at bedside.

## 2020-03-19 NOTE — Plan of Care (Signed)
  Problem: Health Behavior/Discharge Planning: Goal: Ability to manage health-related needs will improve Outcome: Not Progressing  Patient continues to refuse care from nursing staff.  Problem: Clinical Measurements: Goal: Ability to maintain clinical measurements within normal limits will improve Outcome: Progressing   Problem: Activity: Goal: Risk for activity intolerance will decrease Outcome: Not Progressing   Problem: Pain Managment: Goal: General experience of comfort will improve Outcome: Progressing   Problem: Safety: Goal: Ability to remain free from injury will improve Outcome: Not Progressing  Patient is a high fall risk. Non-compliant with safety precautions. Sitter at the bedside due to falls during admission.

## 2020-03-19 NOTE — Progress Notes (Signed)
A safety sitter is at the bedside presently due to the patient being noncompliant with safety instructions.  The patient will have altered moods at times- refusing medications at times and then will take at times.  Appears restless.  The patient refuses to have cardiac monitor in place.

## 2020-03-19 NOTE — Progress Notes (Signed)
Dr Lonny Prude notified via secure message of critical k+ 6.4.

## 2020-03-19 NOTE — Progress Notes (Signed)
Incontinent of stool and urine at times.  Cleaned and linens changed per Nurse tech

## 2020-03-19 NOTE — Progress Notes (Signed)
Per Pharmacy- Ernest Haber, PA is aware of critical k+ 6.4.  Pharmacy is aware of new orders received.

## 2020-03-19 NOTE — Progress Notes (Addendum)
Subjective: Appears calm this a.m. status post Haldol this a.m. and last night, lying in bed follows directions, states he will do dialysis tomorrow  Objective Vital signs in last 24 hours: Vitals:   03/18/20 2020 03/19/20 0500 03/19/20 0506 03/19/20 0700  BP:    (!) 160/72  Pulse: 89  96 76  Resp: '18  18 18  ' Temp:      TempSrc:      SpO2: 100%  95% 98%  Weight:  84.4 kg    Height:       Weight change: 2.892 kg  Physical Exam: General:Alert elderly male  calm this a.m. status post Haldol NAD/ pleasantly confused Heart:RRR, no MRG Lungs:CTA, nonlabored, noted 100% O2 sat room air Abdomen:Bowel sounds normoactive, soft NTND, no ascites Extremities:noPedal edema Dialysisaccess;right IJ PermCath dressing off site nontender no discharge, left upper arm AV fistula positive bruit  LastCXR 9/23 - patchy perihilar/ basilar dz, no edema   OP HD:[facility] MWF 4h 91mn 87kg 2/2 bath TDC / L AVF Hep none  -Mircera 150 mcg IV q 2 weeks (last dose 02/09/2020) -Venofer 100 mg IV X 8 doses (not started yet) -Calcitriol 1.5 mcg PO TIW  Problem/Plan: 1. ESRD - MWF HD.Last HD Saturday, 03/11/2020, refused HD yesterday, noted Tuesday tried to pull needles out in unit  Last labs 10/16 a.m. labs =K5.1 CO2 19 BUN 59 CR 11.13, volume and O2 sat okay today  stated he will do dialysis tomorrow.  Will check be met this a.m., use PermCath because of the large risk risk of pulling lines out with AMS /HIT testing was negative. OK to use heparin to block catheter. 2. AMS -this a.m. calm status post Haldol but no change with impaired judgment and insight,  previous 2 days  refused HD treatments  Per  Psychiatry 03/06/20 suggesting guardianship with daughter/family.Diagnosis=''patient does not have the mental capacity to make an informed decision, patient does not require inpatient Geri psych care'' 3. HD access - L BC AVF placed Feb 2021. Was used starting in May and [facility]  [facility]. Have used AVF w/o issues on last 3 HD sessions. TDC would be ready for removal, but due to lack of good IV access have kept in for now.  4. Hypertension/volume -past 24 hours apparently refusing blood pressure checks, previously on high dose midodrine here and this is now off. Keep even on HD, has not tolerated UF here lately.   5. COVID PNA/ HCAP (EColi/ MSSA resp culture): [facility]. IV cefazolin thru 10/1. Remdesivir stopped 9/22 d/t ^LFT's. COVID isolation ended 03/12/20.  6. Acute hypoxic resp failure. At one point required mechanical ventilation. Resolved.  7. Recent epidural abscess/MRSA cervical discitis- S/p decompression surgery C4-T2 on 01/06/20 during prior admission. Pt admitted this time on 9/19 w/ AMS. 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 NSurg recommended no further surgery. Plans w/ ID were for Vancomycin and Rifampin here through 10/01 but rifampin dc'd early due to ^LFT"s. PO doxy started on 10/01 and pt is now off IV vanc. Will f/u w/ ID clinic in 3-4 wks, they will guide on the stop date of the po doxy. 8. Anemia -Hgb 8.4 = 10/16Aranesp increased to 150 mcg for 9/29 onward for now. Hold OP Fe loadsecondary infection.s/p PRBC's 9/29 9. Metabolic bone disease - off renvela due to hypophosphatemia,phosphorus 4.9 > 3.7 10/16 ,on calcitriol with dialysis - corrected  calcium= 10. 0 continue to follow lab trend 10. DM-per primary 11. Low plts - 2nd level testing for HIT was negative. resolved platelet count 187 this morning 12. H/o HCV/ liver cirrhosis - hep C testing here was negative. Per pmd will need GI f/u in OP setting.   Dispo - awaiting SNF placement. Has been refusing dialysisthis week. Palliative care as above [place] noted 3 daughters working with palliative team/ patient is DNR now\\   Addendum Checked Labs this am  K= 6.4 as dw Dr Justin Mend  = Give kayexalate 30gm  Po x 1 and  Lokelma 10 g tid  And fu with HD in AM  Ernest Haber, PA-C [place] [phone] 03/19/2020,11:31 AM  LOS: 28 days   Labs: Basic Metabolic Panel: Recent Labs  Lab 03/14/20 1500 03/18/20 0148  NA 135 136  K 4.5 5.1  CL 107 108  CO2 19* 19*  GLUCOSE 134* 79  BUN 38* 59*  CREATININE 10.40* 11.13*  CALCIUM 7.7* 8.5*  PHOS 4.9* 3.7   Liver Function Tests: Recent Labs  Lab 03/14/20 1500 03/18/20 0148  ALBUMIN 1.9* 2.1*   No results for input(s): LIPASE, AMYLASE in the last 168 hours. No results for input(s): AMMONIA in the last 168 hours. CBC: Recent Labs  Lab 03/12/20 1747 03/14/20 1500 03/18/20 0148  WBC 6.6 4.7 5.0  HGB 8.4* 7.7* 8.4*  HCT 25.9* 24.4* 25.4*  MCV 86.0 84.7 83.8  PLT 154 146* 187   Cardiac Enzymes: No results for input(s): CKTOTAL, CKMB, CKMBINDEX, TROPONINI in the last 168 hours. CBG: Recent Labs  Lab 03/18/20 0659 03/18/20 1139 03/18/20 1614 03/18/20 2025 03/19/20 0644  GLUCAP 70 108* 91 96 72    Studies/Results: No results found. Medications:  . amLODipine  10 mg Oral Daily  . calcitRIOL  1.5 mcg Oral Q M,W,F-HD  . chlorhexidine  15 mL Mouth Rinse BID  . Chlorhexidine Gluconate Cloth  6 each Topical Q0600  . collagenase   Topical Daily  . darbepoetin (ARANESP) injection - DIALYSIS  150 mcg Intravenous Q Wed-HD  . doxycycline  100 mg Oral Q12H  . feeding supplement (NEPRO CARB STEADY)  237 mL Oral BID BM  . insulin aspart  0-5 Units Subcutaneous QHS  . insulin aspart  0-9 Units Subcutaneous TID WC  . mouth rinse  15 mL Mouth Rinse q12n4p  . multivitamin  1 tablet Oral QHS  . pantoprazole  40 mg Oral Daily  . QUEtiapine  50 mg Oral BID  . thiamine  100 mg Oral Daily  . zinc sulfate  220 mg Oral Daily

## 2020-03-19 NOTE — Progress Notes (Signed)
The patient has been offered po pain medication but has refused. Periorbital edema noted.  He continues to call out frequently.  He has refused to have foam dressing applied to his coccyx and buttocks area.  Safety sitter remains at the bedside.

## 2020-03-19 NOTE — Progress Notes (Signed)
Haldol 2.5mg   IM given for restlessness and wanting to get out of bed.  Safety sitter is at the bedside.  Nephrology PA at the bedside for daily assessment.

## 2020-03-20 DIAGNOSIS — G9341 Metabolic encephalopathy: Secondary | ICD-10-CM | POA: Diagnosis not present

## 2020-03-20 DIAGNOSIS — E875 Hyperkalemia: Secondary | ICD-10-CM | POA: Diagnosis not present

## 2020-03-20 DIAGNOSIS — Z515 Encounter for palliative care: Secondary | ICD-10-CM

## 2020-03-20 DIAGNOSIS — N186 End stage renal disease: Secondary | ICD-10-CM | POA: Diagnosis not present

## 2020-03-20 DIAGNOSIS — I10 Essential (primary) hypertension: Secondary | ICD-10-CM | POA: Diagnosis not present

## 2020-03-20 LAB — BASIC METABOLIC PANEL
Anion gap: 11 (ref 5–15)
BUN: 72 mg/dL — ABNORMAL HIGH (ref 8–23)
CO2: 18 mmol/L — ABNORMAL LOW (ref 22–32)
Calcium: 8.6 mg/dL — ABNORMAL LOW (ref 8.9–10.3)
Chloride: 108 mmol/L (ref 98–111)
Creatinine, Ser: 12.81 mg/dL — ABNORMAL HIGH (ref 0.61–1.24)
GFR, Estimated: 4 mL/min — ABNORMAL LOW (ref >60)
Glucose, Bld: 117 mg/dL — ABNORMAL HIGH (ref 70–99)
Potassium: 5.9 mmol/L — ABNORMAL HIGH (ref 3.5–5.1)
Sodium: 137 mmol/L (ref 135–145)

## 2020-03-20 LAB — CBC
HCT: 25.1 % — ABNORMAL LOW (ref 39.0–52.0)
Hemoglobin: 8.1 g/dL — ABNORMAL LOW (ref 13.0–17.0)
MCH: 26.5 pg (ref 26.0–34.0)
MCHC: 32.3 g/dL (ref 30.0–36.0)
MCV: 82 fL (ref 80.0–100.0)
Platelets: 221 10*3/uL (ref 150–400)
RBC: 3.06 MIL/uL — ABNORMAL LOW (ref 4.22–5.81)
RDW: 18.5 % — ABNORMAL HIGH (ref 11.5–15.5)
WBC: 4.8 10*3/uL (ref 4.0–10.5)
nRBC: 0 % (ref 0.0–0.2)

## 2020-03-20 LAB — GLUCOSE, CAPILLARY
Glucose-Capillary: 66 mg/dL — ABNORMAL LOW (ref 70–99)
Glucose-Capillary: 112 mg/dL — ABNORMAL HIGH (ref 70–99)
Glucose-Capillary: 103 mg/dL — ABNORMAL HIGH (ref 70–99)

## 2020-03-20 MED ORDER — GLYCOPYRROLATE 0.2 MG/ML IJ SOLN: 0.2000 mg | INTRAMUSCULAR | Status: DC | PRN

## 2020-03-20 MED ORDER — HALOPERIDOL LACTATE 2 MG/ML PO CONC
0.5000 mg | ORAL | Status: DC | PRN
  Filled 2020-03-20: qty 0.3

## 2020-03-20 MED ORDER — LORAZEPAM 2 MG/ML PO CONC
1.0000 mg | ORAL | Status: DC | PRN
  Administered 2020-03-24: 1 mg via SUBLINGUAL
  Filled 2020-03-20: qty 1

## 2020-03-20 MED ORDER — ACETAMINOPHEN 650 MG RE SUPP: 650.0000 mg | Freq: Four times a day (QID) | RECTAL | Status: DC | PRN

## 2020-03-20 MED ORDER — ACETAMINOPHEN 325 MG PO TABS: 650.0000 mg | ORAL_TABLET | Freq: Four times a day (QID) | ORAL | Status: DC | PRN

## 2020-03-20 MED ORDER — HYDROMORPHONE HCL 2 MG PO TABS
2.0000 mg | ORAL_TABLET | ORAL | Status: DC | PRN
  Administered 2020-03-21 – 2020-03-23 (×3): 2 mg via ORAL
  Filled 2020-03-20 (×3): qty 1

## 2020-03-20 MED ORDER — HALOPERIDOL 0.5 MG PO TABS
0.5000 mg | ORAL_TABLET | ORAL | Status: DC | PRN
  Filled 2020-03-20: qty 1

## 2020-03-20 MED ORDER — HALOPERIDOL LACTATE 5 MG/ML IJ SOLN
0.5000 mg | INTRAMUSCULAR | Status: DC | PRN
  Filled 2020-03-20: qty 0.1

## 2020-03-20 MED ORDER — SODIUM ZIRCONIUM CYCLOSILICATE 10 G PO PACK
10.0000 g | PACK | Freq: Once | ORAL | Status: AC
  Administered 2020-03-20: 10 g via ORAL
  Filled 2020-03-20: qty 1

## 2020-03-20 MED ORDER — BIOTENE DRY MOUTH MT LIQD: 15.0000 mL | OROMUCOSAL | Status: DC | PRN

## 2020-03-20 MED ORDER — SODIUM ZIRCONIUM CYCLOSILICATE 10 G PO PACK
10.0000 g | PACK | Freq: Every day | ORAL | Status: DC
  Administered 2020-03-21 – 2020-03-22 (×2): 10 g via ORAL
  Filled 2020-03-20 (×3): qty 1

## 2020-03-20 MED ORDER — POLYVINYL ALCOHOL 1.4 % OP SOLN: 1.0000 [drp] | Freq: Four times a day (QID) | OPHTHALMIC | Status: DC | PRN

## 2020-03-20 MED ORDER — HEPARIN SODIUM (PORCINE) 1000 UNIT/ML IJ SOLN
INTRAMUSCULAR | Status: AC
  Filled 2020-03-20: qty 4

## 2020-03-20 MED ORDER — ONDANSETRON HCL 4 MG/2ML IJ SOLN: 4.0000 mg | Freq: Four times a day (QID) | INTRAMUSCULAR | Status: DC | PRN

## 2020-03-20 MED ORDER — GLYCOPYRROLATE 1 MG PO TABS: 1.0000 mg | ORAL_TABLET | ORAL | Status: DC | PRN

## 2020-03-20 MED ORDER — ONDANSETRON 4 MG PO TBDP: 4.0000 mg | ORAL_TABLET | Freq: Four times a day (QID) | ORAL | Status: DC | PRN

## 2020-03-20 NOTE — Progress Notes (Signed)
[facility] ([facility]) Hospital Liaison note.    Received request from TOC manager for family interest in [facility]. [facility] is unable to offer a room today. Hospital Liaison will follow up tomorrow or sooner if a room becomes available.   Please do not hesitate to call with questions.   T hank you for the opportunity to participate in this patient's care.  Chrislyn King, BSN, RN [facility] Hospital Liaison (listed on AMION under Hospice/[facility])    [phone]     

## 2020-03-20 NOTE — Progress Notes (Signed)
Daily Progress Note   Patient Name: [name].       Date: 03/20/2020 DOB: [birth year]  Age: 69 y.o. MRN#: [id number] Attending Physician: Mariel Aloe, MD Primary Care Physician: [facility] Admit Date: 02/20/2020  Reason for Consultation/Follow-up: To discuss complex medical decision making related to patient's goals of care  Received call from patient's RN.  He refused HD this morning (again).  Potassium climbing.  Sometimes he will take medications to rectify it and sometimes he won't.   Subjective: [name] is at bedside feeding her father.  She is able to joke with him and make him smile.   Outside the room we talk.  She has seen the decline and she is accepting of what is happening.  I talked with him about comfort care - she responded that he would like that because he likes to be in control.  We discussed [facility].  She indicates that the family would want him in [place] at [facility].  We talked about the fact that with a very high potassium his days could be   We went back into the room and I talked with [name].  I explained that he was in charge of his medications and situation.  I further explained that his days are short and advised him to say anything that was important to his daughters.   I asked both [name] and [name] if he understood what I was saying - both indicated that they understood.  I attempted to call the patient's sister to update her but reached a voice mail.  Assessment: Patient repeatedly refusing HD.  Growing progressively weaker.  Hyperkalemic.  No longer on COVID precautions.   Patient Profile/HPI:  69 y.o.malewith past medical history of HTN/HLD, ESRD on HD, DM 2, anemia of chronic disease, colon cancer SP  resection, epidural abscess SP laminectomy with fusion 01/2020,MRSA bacteremiaadmitted on 9/19/2021with Covid infection, acute metabolic encephalopathy likely in setting of sepsis due to COVID-19 infection, epidural abscess status post laminectomy and fusion August 2021, went to short-term rehab.    Length of Stay: 29   Vital Signs: BP (!) 144/69 (BP Location: Right Arm)   Pulse 90   Temp 98.3 F (36.8 C) (Oral)   Resp 15   Ht 6' (1.829 m)   Wt 84.4  kg   SpO2 98%   BMI 25.23 kg/m  SpO2: SpO2: 98 % O2 Device: O2 Device: Room Air O2 Flow Rate: O2 Flow Rate (L/min): 2 L/min       Palliative Assessment/Data: 20%     Palliative Care Plan    Recommendations/Plan:  Comfort measures.   No further labs or HD.  Allow patient to dictate meds, food, care.  Family is allowed 3 at bedside and to be able to rotate out.  Bed request made to [facility] - but given his hyper K and their back log, I am worried he may die here before a bed is available.  Code Status:  DNR  Prognosis:   Hours - Days   Discharge Planning:  Anticipated Hospital Death vs [place] was discussed with RN, TOC, [name], medical team.  Thank you for allowing the Palliative Medicine Team to assist in the care of this patient.  Total time spent:  35 min.     Greater than 50%  of this time was spent counseling and coordinating care related to the above assessment and plan.  Florentina Jenny, PA-C Palliative Medicine  Please contact Palliative MedicineTeam phone at [phone] for questions and concerns between 7 am - 7 pm.   Please see AMION for individual provider pager numbers.

## 2020-03-20 NOTE — Plan of Care (Signed)
  Problem: Education: Goal: Knowledge of General Education information will improve Description: Including pain rating scale, medication(s)/side effects and non-pharmacologic comfort measures Outcome: Not Progressing   Problem: Health Behavior/Discharge Planning: Goal: Ability to manage health-related needs will improve Outcome: Not Progressing   Problem: Clinical Measurements: Goal: Ability to maintain clinical measurements within normal limits will improve Outcome: Not Progressing  Pt is confused

## 2020-03-20 NOTE — Progress Notes (Signed)
PROGRESS NOTE    [name].  WCH:[id number] DOB: 07/17/1950 DOA: 02/20/2020 PCP: [facility]   Brief Narrative: Rodney Pastures Uchenna Rappap[place]. is a 69 y.o. malewith PMHx of ESRD on HD MWF, DM-2, MRSA bacteremia with epidural abscess s/p C4-T2 laminectomy/fusion on 01/06/2020-on IV vancomycin/rifampin through 10/1-resident of SNF-apparently tested positive for COVID-19 on 9/18-subsequently brought to the ED on 9/19 for worsening confusion (more than baseline)-fever-thought to have acute metabolic encephalopathy from COVID-19 infection and admitted to the hospitalist service. Further hospital course complicated by development of hypotension/severe encephalopathy requiring intubation and ICU transfer.   Significant Events: 8/3-8/23>>hospitalized for epidural abscess/MRSA bacteremia-underwent laminectomy-briefly intubated due to altered mental status. Discharged to SNF (prior admission) 9/18>> COVID-19 positive at SNF 9/19>> Admit to [facility] for fever/confusion 9/22>> obtunded/hypotensive after coming back from hemodialysis-emergently intubated/transferred to ICU 9/30>> transferred back to [facility] 9/30>> SQ heparin stopped-pharmacy consulted for HIT protocol.  Significant studies: 8/6>> Echo: EF> 75% 9/19>>Chest x-ray: No acute abnormality in the lung. 9/19>> CT head: No acute intracranial abnormality 9/19>> CT C-spine: Postoperative findings of posterior laminectomy/fusion C4-T1, prevertebral soft tissue edema appears reduced, postoperative fluid collection overlies laminectomy site does not appear significantly changed compared to prior examination. 9/20>> MRI C-spine: Attempted but due to AMS-not completed 9/21>> MRI C Spine: Osteomyelitis of skull base, anterior C1 and odontoid, C2-C3 facet joint-associated erosion of bone since 8/27-resolved epidural abscess 9/22>> coarsened/heterogeneous appearance of the liver parenchyma-suspicious for cirrhosis, gallbladder not  visualized. 9/22>> bilateral lower extremity Doppler: No DVT 9/29>> chest x-ray: Bibasilar infiltrates suggesting multifocal pneumonia   Assessment & Plan:   Principal Problem:   Acute metabolic encephalopathy Active Problems:   Hyperlipidemia   Hyperkalemia   HYPERTENSION   Anemia of chronic disease   Type 2 diabetes mellitus with renal complication (HCC)   ESRD (end stage renal disease) (HCC)   Epidural abscess   COVID-19   Hx of bacteremia   Goals of care, counseling/discussion   Palliative care by specialist   DNR (do not resuscitate) discussion   Palliative care encounter   Acute respiratory failure with hypoxia Secondary to below. Patient required intubation and mechanical ventilation and was extubated on 9/26. Currently on room air.  COVID-19 pneumonia HCAP Sputum culture significant for MSSA and E. Coli. He was treated with Cefazolin for bacterial pneumonia in addition to Remdesivir. Remdesivir treatment was discontinued secondary to significant rise in LFTs. Patient has completed quarantine period.  Acute metabolic encephalopathy Likely multifactorial in setting of uremia vs sepsis. Appears to be resolved. Negative CT head.  Osteomyelitis of skull base Neurosurgery consulted with no recommendations for surgical management at this time. Patient was managed on Vancomycin and Rifampin, however this was switched to doxycycline secondary to elevated LFTs. Currently on Doxycycline with recommendations for follow-up at the ID clinic 3-4 weeks after discharge in addition to neurosurgery follow-up. -Continue Doxycycline  Septic shock In setting of pneumonia. Required vasopressor support in the ICU which is now resolved.  Dysphagia -Continue dysphagia 3 diet  Thrombocytopenia Mild. Stable.  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 Patient is  refusing HD. Had discussion with patient for which he states he does not like having HD at [facility] secondary to the HD unit being cold. He understands that not having HD will likely lead to his death and instead will wait until he can discharge to resume HD. Per psychiatry, patient does not  have capacity, however this does not address the issue of patient needing to be compliant with HD for safety. Patient continues to decline HD.  Essential hypertension -Continue amlodipine  Hyperkalemia Secondary to HD non-adherence. -Nephrology management -Telemetry  Hyperlipidemia Patient is on Lipitor as an outpatient which was held secondary to elevated LFTs this admission.  Diabetes mellitus, type 2 Hemoglobin A1C of 6.5%. Patient is on Levemir 11 units BID as an outpatient. -Continue SSI  Pressure injury Right/left buttock, not present on admission   DVT prophylaxis: SCDs Code Status:   Code Status: DNR Family Communication: None at bedside. Disposition Plan: Anticipate discharge to hospice facility since patient continues to decline hemodialysis. Will likely be transitioned to full comfort measures only   Consultants:   PCCM  Nephrology  Neurosurgery  Palliative care medicine  Procedures:     Antimicrobials:      Subjective: Says he does not know if he is having HD today.   Objective: Vitals:   03/19/20 0700 03/20/20 0614 03/20/20 0725 03/20/20 1039  BP: (!) 160/72 (!) 176/84 (!) 159/86 (!) 144/69  Pulse: 76 (!) 57 88 90  Resp: 18 20 18 15   Temp:  99 F (37.2 C) 98.3 F (36.8 C) 98.3 F (36.8 C)  TempSrc:  Oral Axillary Oral  SpO2: 98% 92% 95% 98%  Weight:      Height:        Intake/Output Summary (Last 24 hours) at 03/20/2020 1210 Last data filed at 03/20/2020 1000 Gross per 24 hour  Intake 240 ml  Output --  Net 240 ml   Filed Weights   03/14/20 1347 03/18/20 0500 03/19/20 0500  Weight: 82.7 kg 81.5 kg 84.4 kg    Examination: Patient refused for me  to physically examine him  General exam: Appears calm initially, then agitated. [name] is off his torso. Respiratory system: Respiratory effort normal. Gastrointestinal system: Abdomen is nondistended Central nervous system: Alert and oriented to person and place. Musculoskeletal: No edema. Skin: No cyanosis. No rashes Psychiatry: Agitated   Data Reviewed: I have personally reviewed following labs and imaging studies  CBC Lab Results  Component Value Date   WBC 4.8 03/20/2020   RBC 3.06 (L) 03/20/2020   HGB 8.1 (L) 03/20/2020   HCT 25.1 (L) 03/20/2020   MCV 82.0 03/20/2020   MCH 26.5 03/20/2020   PLT 221 03/20/2020   MCHC 32.3 03/20/2020   RDW 18.5 (H) 03/20/2020   LYMPHSABS 1.0 02/24/2020   MONOABS 0.2 02/24/2020   EOSABS 0.0 02/24/2020   BASOSABS 0.0 70/35/0093     Last metabolic panel Lab Results  Component Value Date   NA 137 03/20/2020   K 5.9 (H) 03/20/2020   CL 108 03/20/2020   CO2 18 (L) 03/20/2020   BUN 72 (H) 03/20/2020   CREATININE 12.81 (H) 03/20/2020   GLUCOSE 117 (H) 03/20/2020   GFRNONAA 4 (L) 03/20/2020   GFRAA 9 (L) 03/05/2020   CALCIUM 8.6 (L) 03/20/2020   PHOS 3.7 03/18/2020   PROT 6.1 (L) 03/05/2020   ALBUMIN 2.1 (L) 03/18/2020   BILITOT 1.3 (H) 03/05/2020   ALKPHOS 93 03/05/2020   AST 30 03/05/2020   ALT 9 03/05/2020   ANIONGAP 11 03/20/2020    CBG (last 3)  Recent Labs    03/20/20 0700 03/20/20 0846 03/20/20 1141  GLUCAP 66* 112* 103*     GFR: Estimated Creatinine Clearance: 6.1 mL/min (A) (by C-G formula based on SCr of 12.81 mg/dL (H)).  Coagulation Profile: No results  for input(s): INR, PROTIME in the last 168 hours.  No results found for this or any previous visit (from the past 240 hour(s)).      Radiology Studies: No results found.      Scheduled Meds: . amLODipine  10 mg Oral Daily  . calcitRIOL  1.5 mcg Oral Q M,W,F-HD  . chlorhexidine  15 mL Mouth Rinse BID  . Chlorhexidine Gluconate Cloth  6 each  Topical Q0600  . collagenase   Topical Daily  . darbepoetin (ARANESP) injection - DIALYSIS  150 mcg Intravenous Q Wed-HD  . doxycycline  100 mg Oral Q12H  . feeding supplement (NEPRO CARB STEADY)  237 mL Oral BID BM  . glucagon (human recombinant)  1 mg Intramuscular STAT  . heparin sodium (porcine)      . insulin aspart  0-5 Units Subcutaneous QHS  . insulin aspart  0-9 Units Subcutaneous TID WC  . mouth rinse  15 mL Mouth Rinse q12n4p  . multivitamin  1 tablet Oral QHS  . pantoprazole  40 mg Oral Daily  . QUEtiapine  50 mg Oral BID  . [START ON 03/21/2020] sodium zirconium cyclosilicate  10 g Oral Daily  . thiamine  100 mg Oral Daily  . zinc sulfate  220 mg Oral Daily   Continuous Infusions:   LOS: 29 days     Cordelia Poche, MD [facility] 03/20/2020, 12:10 PM  If 7PM-7AM, please contact night-coverage [email or web address]

## 2020-03-20 NOTE — Progress Notes (Signed)
[facility] Progress Note   Subjective:  Agreed to come for HD, but once in unit now refusing and won't let the RN start his treatment. Sitter at bedside. Dr. Jonnie Finner attempted to convince him to do HD, still refusing. K was 6.4 yesterday - able to get labs today and will send off. Told that he may die without dialysis - he said "so will you." Now yelling that his stomach hurts.  Objective Vitals:   03/19/20 0506 03/19/20 0700 03/20/20 0614 03/20/20 0725  BP:  (!) 160/72 (!) 176/84 (!) 159/86  Pulse: 96 76 (!) 57 88  Resp: 18 18 20 18   Temp:   99 F (37.2 C) 98.3 F (36.8 C)  TempSrc:   Oral Axillary  SpO2: 95% 98% 92% 95%  Weight:      Height:       Physical Exam General: Confused man, NAD. Refusing HD today. Heart: RRR; no murmur Lungs: CTA anteriorly Abdomen: soft, non-tender Extremities: No LE edema Dialysis Access: TDC in R chest  Additional Objective Labs: Basic Metabolic Panel: Recent Labs  Lab 03/14/20 1500 03/18/20 0148 03/19/20 1034  NA 135 136 132*  K 4.5 5.1 6.4*  CL 107 108 105  CO2 19* 19* 19*  GLUCOSE 134* 79 129*  BUN 38* 59* 68*  CREATININE 10.40* 11.13* 12.18*  CALCIUM 7.7* 8.5* 8.6*  PHOS 4.9* 3.7  --    Liver Function Tests: Recent Labs  Lab 03/14/20 1500 03/18/20 0148  ALBUMIN 1.9* 2.1*   CBC: Recent Labs  Lab 03/14/20 1500 03/18/20 0148  WBC 4.7 5.0  HGB 7.7* 8.4*  HCT 24.4* 25.4*  MCV 84.7 83.8  PLT 146* 187   CBG: Recent Labs  Lab 03/19/20 1721 03/19/20 2146 03/19/20 2343 03/20/20 0700 03/20/20 0846  GLUCAP 88 69* 95 66* 112*   Medications:   heparin sodium (porcine)       amLODipine  10 mg Oral Daily   calcitRIOL  1.5 mcg Oral Q M,W,F-HD   chlorhexidine  15 mL Mouth Rinse BID   Chlorhexidine Gluconate Cloth  6 each Topical Q0600   collagenase   Topical Daily   darbepoetin (ARANESP) injection - DIALYSIS  150 mcg Intravenous Q Wed-HD   doxycycline  100 mg Oral Q12H   feeding supplement  (NEPRO CARB STEADY)  237 mL Oral BID BM   glucagon (human recombinant)  1 mg Intramuscular STAT   insulin aspart  0-5 Units Subcutaneous QHS   insulin aspart  0-9 Units Subcutaneous TID WC   mouth rinse  15 mL Mouth Rinse q12n4p   multivitamin  1 tablet Oral QHS   pantoprazole  40 mg Oral Daily   QUEtiapine  50 mg Oral BID   [START ON 03/21/2020] sodium zirconium cyclosilicate  10 g Oral Daily   thiamine  100 mg Oral Daily   zinc sulfate  220 mg Oral Daily    Dialysis Orders: [facility] MWF 4h 40min 87kg 2/2 bath TDC / L AVF Hep none  - Mircera 150 mcg IV q 2 weeks (last dose 02/09/2020) - Venofer 100 mg IV X 8 doses (not started yet) - Calcitriol 1.5 mcg PO TIW  Assessment/Plan: 1. AMS + chronic dementia: Yelling today and refusing dialysis again. Has tried to pull out his HD lines. Refusing nearly all meds. Unfortunately we are close to the point where dialysis is no longer safe NOR providing any improvement to his mental status or QOL. Palliative care involved - appreciate their assistance. 2. ESRD:  Usual MWF schedule. Last HD was 10/9 - continues to refuse. Has an AVF but we are using his [facility] for safety reasons (attempted to pull out needles). K climbing, 6.4 yesterday. Did get 1 dose of Lokelma + 1 dose kayexalate. We cannot force him to do dialysis regardless of the fact that he does not have mental capacity to make the decision. Initially agreed today and brought up the the HD unit, then adamantly refusing again. 3. HTN/volume: BP high, volume not an issue yet -- he has been eating poorly. 4. Anemia: Hgb 8.4 - slight improvement. 5. Secondary hyperparathyroidism: Ca/Phos ok. Continue home meds. 6. Nutrition: Alb low, not eating well. 7. T2DM: Per primary. 8. Hx COVID pneumonia: + test 9/18 and here. Finished IV Cefazolin on 10/1. Remdesivir stopped 9/22 d/t ^ LFTs. Isolation ended on 10/10. 9. Hx Hypoxic Resp Failure: D/t #8, required short-term intubation/ventilation, now  resolved. 10. Thrombocytopenia: HIT testing negative. Improved. 11. Cirrhosis: HCV testing negative. 12. DNR.  Veneta Penton, PA-C 03/20/2020, 10:12 AM  [employer]

## 2020-03-20 NOTE — Progress Notes (Signed)
Physical Therapy Treatment Patient Details Name: [name]. MRN: [id number] DOB: 1950-08-20 Today's Date: 03/20/2020    History of Present Illness Pt is a 69 y.o. male with d/c to SNF after prolonged complicated admission s/p C4-T2 laminectomy fusion (01/06/20), now admitted from [facility] 02/20/20 with confusion. Workup for metabolic encephalopathy, shock hypoxia; (+) COVID-19. 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 ETT 9/22-9/25. PMH includes HTN, HLD, ESRD (HD MWF), colon CA s/p resection. Had fall during hospitalization 10/7.    PT Comments    Patient slowly progressing towards physical therapy goals. Patient progress during sessions are limited by agitation and impaired cognition. Session focused on bed mobility with patient pulling to sit x 4 with B UEs and modA. Patient required min-modA for sit to supine with increased time to complete due to agitation. Patient continues to be limited by impaired cognition, generalized weakness, impaired functional mobility, impaired balance. Continue to recommend SNF for ongoing Physical Therapy.     Follow Up Recommendations  SNF;Supervision/Assistance - 24 hour     Equipment Recommendations  None recommended by PT    Recommendations for Other Services       Precautions / Restrictions Precautions Precautions: Fall Precaution Comments: AMS, agitation, impulsive Restrictions Weight Bearing Restrictions: No    Mobility  Bed Mobility Overal bed mobility: Needs Assistance Bed Mobility: Supine to Sit;Sit to Supine     Supine to sit: +2 for safety/equipment;Mod assist Sit to supine: Mod assist   General bed mobility comments: pt able to sit up on EOB with modA, however required modA to maintain upright sitting EOB. Patient request to lay back down. Completed x4 supine<>sit   Transfers                    Ambulation/Gait                 Stairs              Wheelchair Mobility    Modified Rankin (Stroke Patients Only)       Balance Overall balance assessment: Needs assistance Sitting-balance support: Feet supported Sitting balance-Leahy Scale: Poor Sitting balance - Comments: sitting EOB with modA and minimal use of UEs Postural control: Posterior lean                                  Cognition Arousal/Alertness: Awake/alert Behavior During Therapy: Restless;Agitated;Impulsive Overall Cognitive Status: Impaired/Different from baseline Area of Impairment: Attention;Memory;Following commands;Safety/judgement;Awareness;Problem solving;Orientation                 Orientation Level: Disoriented to;Situation;Time Current Attention Level: Focused Memory: Decreased short-term memory Following Commands: Follows one step commands with increased time;Follows one step commands inconsistently Safety/Judgement: Decreased awareness of safety;Decreased awareness of deficits Awareness: Intellectual Problem Solving: Slow processing;Difficulty sequencing;Requires verbal cues;Requires tactile cues General Comments: Patient cooperative initially, however became agitated with requests to move. Patient daughter present. She assisted with encouragement and agitation level.       Exercises      General Comments General comments (skin integrity, edema, etc.): Daughter present this session      Pertinent Vitals/Pain Pain Assessment: No/denies pain    Home Living                      Prior Function            PT Goals (current goals  can now be found in the care plan section) Acute Rehab PT Goals Patient Stated Goal: unstated PT Goal Formulation: Patient unable to participate in goal setting Time For Goal Achievement: 03/30/20 Potential to Achieve Goals: Fair Progress towards PT goals: Progressing toward goals    Frequency    Min 2X/week      PT Plan Current plan remains appropriate     Co-evaluation              AM-PAC PT "6 Clicks" Mobility   Outcome Measure  Help needed turning from your back to your side while in a flat bed without using bedrails?: A Little Help needed moving from lying on your back to sitting on the side of a flat bed without using bedrails?: A Little Help needed moving to and from a bed to a chair (including a wheelchair)?: Total Help needed standing up from a chair using your arms (e.g., wheelchair or bedside chair)?: A Lot Help needed to walk in hospital room?: Total Help needed climbing 3-5 steps with a railing? : Total 6 Click Score: 11    End of Session   Activity Tolerance: Treatment limited secondary to agitation Patient left: in bed;with bed alarm set;with family/visitor present Nurse Communication: Mobility status PT Visit Diagnosis: Other abnormalities of gait and mobility (R26.89);Muscle weakness (generalized) (M62.81);Other symptoms and signs involving the nervous system (R29.898)     Time: 1779-3903 PT Time Calculation (min) (ACUTE ONLY): 16 min  Charges:  $Therapeutic Activity: 8-22 mins                     Perrin Maltese, PT, DPT Acute Rehabilitation Services Pager [phone] Office [phone]    [name] 03/20/2020, 11:47 AM

## 2020-03-20 NOTE — Progress Notes (Signed)
Renal Navigator aware of patient's transition to comfort care and referral to inpatient Hospice when a bed becomes available. Navigator updated patient's outpatient HD clinic.  Alphonzo Cruise,  Renal Navigator [phone]

## 2020-03-20 NOTE — Progress Notes (Signed)
Patient will have altered moods. Refusing meds and care at one point then will take it eventually. CBG was 69. Nursing offered orange juice to the patient but refused at first despite proper education and encouragement. TRH on call, X.Blount notified of pt's CBG and refusal to take anything p.o. Glucagon IM was ordered. Nursing attempted to give patient orange juice and was then successful to finish 4 oz of it. Follow up CBG was 95. Pt also took his night time meds.

## 2020-03-20 NOTE — TOC Progression Note (Signed)
Transition of Care (TOC) - Progression Note    Patient Details  Name: [name]. MRN: [id number] Date of Birth: June 02, 1951  Transition of Care [facility]) CM/SW Contact  Curlene Labrum, RN Phone Number: 03/20/2020, 1:39 PM  Clinical Narrative:    Case management spoke with Palliative Care Medicine after the patient was placed on comfort care measures this afternoon.  The patient is declining HD treatments at this time and the family would like the patient placed in a [facility] facility.  I called and spoke with Chrislyn, RNCM with [facility] and they were given the referral at the patient's family's permission.  There are no beds at [facility] in [place], [place] at this time but the patient is placed on the waiting list for an available bed by [facility].  Case management will continue to follow the patient for available bed at [facility]   Expected Discharge Plan: Skilled Nursing Facility Barriers to Discharge: Continued Medical Work up  Expected Discharge Plan and Services Expected Discharge Plan: [place] In-house Referral: Clinical Social Work Discharge Planning Services: CM Consult Post Acute Care Choice: Durable Medical Equipment, Home Health Living arrangements for the past 2 months: Apartment                                       Social Determinants of Health (SDOH) Interventions    Readmission Risk Interventions Readmission Risk Prevention Plan 02/22/2020 01/18/2020  Transportation Screening Complete Complete  Medication Review Press photographer) Referral to Pharmacy Complete  PCP or Specialist appointment within 3-5 days of discharge Complete Complete  HRI or [place] Complete Complete  SW Recovery Care/Counseling Consult Complete Complete  Palliative Care Screening Not Applicable Complete  [place] Patient Refused Complete  Some recent data might be  hidden

## 2020-03-20 NOTE — Progress Notes (Signed)
   03/20/20 1658  Clinical Encounter Type  Visited With Patient  Visit Type Spiritual support  Referral From Nurse  Consult/Referral To Chaplain  Spiritual Encounters  Spiritual Needs Emotional  Chaplain responded to consult and visited [name].  Talked with him about how he was feeling today.  His daughter was not present and [name] was getting ready to receive a bath and informed him I will be back to visit.  Chaplain Nikki Rusnak Morgan-Simpson  [phone]

## 2020-03-20 NOTE — Progress Notes (Signed)
Appreciate palliative and [facility] assistance. Pt is now comfort care, no further HD. Will sign off.   Kelly Splinter, MD 03/20/2020, 5:23 PM

## 2020-03-20 NOTE — Progress Notes (Signed)
Pt refusing dialysis; explained he needed HD d/t hyperkalemia; pt still refused HD. Dr. Roney Jaffe, Nephrologist made aware while on the unit and he tried to talk to him but pt still refused. Pt was sent back to his room and Roj Arcilla, RN called and made aware.

## 2020-03-20 NOTE — Plan of Care (Addendum)
Pt's CBG at 0700=66. Pt ate some of his breakfast, CBG rechecked at 0846=112. Pt encouraged to eat his meals.  0900, Pt agreed to have his dialysis done today. Report given to Dane, RN, Pt transported to HD.  1030 Received Pt in bed from HD, Pt again refused HD today, vital signs taken and recorded, sitter at bedside. MD notified.  Daughter Debroah Baller) at bedside updated.  Problem: Clinical Measurements: Goal: Ability to maintain clinical measurements within normal limits will improve Outcome: Progressing   Problem: Activity: Goal: Risk for activity intolerance will decrease Outcome: Progressing   Problem: Nutrition: Goal: Adequate nutrition will be maintained Outcome: Progressing   Problem: Coping: Goal: Level of anxiety will decrease Outcome: Progressing   Problem: Elimination: Goal: Will not experience complications related to bowel motility Outcome: Progressing   Problem: Pain Managment: Goal: General experience of comfort will improve Outcome: Progressing   Problem: Safety: Goal: Ability to remain free from injury will improve Outcome: Progressing   Problem: Skin Integrity: Goal: Risk for impaired skin integrity will decrease Outcome: Progressing

## 2020-03-20 NOTE — Progress Notes (Addendum)
Pt brought upstairs and again refuses dialysis. Last HD was on 10/12.  Will not plan on taking him upstairs for dialysis again unless we hear from primary team or palliative that patient is agreeing to do dialysis. Otherwise, if we do not hear from anybody, we will check with the patient on his regular HD days (MWF) if he is willing to do dialysis before sending him upstairs.   Kelly Splinter, MD 03/20/2020, 10:16 AM

## 2020-03-21 DIAGNOSIS — G9341 Metabolic encephalopathy: Secondary | ICD-10-CM | POA: Diagnosis not present

## 2020-03-21 DIAGNOSIS — N186 End stage renal disease: Secondary | ICD-10-CM | POA: Diagnosis not present

## 2020-03-21 DIAGNOSIS — I10 Essential (primary) hypertension: Secondary | ICD-10-CM | POA: Diagnosis not present

## 2020-03-21 DIAGNOSIS — E875 Hyperkalemia: Secondary | ICD-10-CM | POA: Diagnosis not present

## 2020-03-21 NOTE — Progress Notes (Signed)
Nutrition Brief Note  Chart reviewed. Pt now transitioning to comfort care.  No further nutrition interventions warranted at this time.  Please re-consult as needed.   Shawndell Schillaci W, RD, LDN, CDCES Registered Dietitian II Certified Diabetes Care and Education Specialist Please refer to AMION for RD and/or RD on-call/weekend/after hours pager  

## 2020-03-21 NOTE — Progress Notes (Signed)
Palliative Medicine RN Note: Symptom check. Pt is out of COVID isolation. He denies pain or trouble breathing, but still requires sitter for agitation. He is waiting on a [employer] bed. Prognosis is very short in light of no longer taking HD per Dr Hilma Favors.  Marjie Skiff Loria Lacina, RN, BSN, [facility] Palliative Medicine Team 03/21/2020 3:55 PM Office [phone]

## 2020-03-21 NOTE — Progress Notes (Signed)
[facility] ([facility]) Hospital Liaison note.   Received request from TOC manager for family interest in [facility]. [facility] is unable to offer a room today. Hospital Liaison will follow up tomorrow or sooner if a room becomes available.   A Please do not hesitate to call with questions.   Thank you,  Melissa O'Bryant, BSN, RN  [facility] Hospital Liaison (listed on AMION under [facility] [place])   [phone]      

## 2020-03-21 NOTE — Progress Notes (Signed)
PROGRESS NOTE    [name].  [name] DOB: 1951/05/06 DOA: 02/20/2020 PCP: [facility]   Brief Narrative: Rodney Pastures Perrin Gens. is a 69 y.o. malewith PMHx of ESRD on HD MWF, DM-2, MRSA bacteremia with epidural abscess s/p C4-T2 laminectomy/fusion on 01/06/2020-on IV vancomycin/rifampin through 10/1-resident of SNF-apparently tested positive for COVID-19 on 9/18-subsequently brought to the ED on 9/19 for worsening confusion (more than baseline)-fever-thought to have acute metabolic encephalopathy from COVID-19 infection and admitted to the hospitalist service. Further hospital course complicated by development of hypotension/severe encephalopathy requiring intubation and ICU transfer.   Significant Events: 8/3-8/23>>hospitalized for epidural abscess/MRSA bacteremia-underwent laminectomy-briefly intubated due to altered mental status. Discharged to SNF (prior admission) 9/18>> COVID-19 positive at SNF 9/19>> Admit to [facility] for fever/confusion 9/22>> obtunded/hypotensive after coming back from hemodialysis-emergently intubated/transferred to ICU 9/30>> transferred back to [facility] 9/30>> SQ heparin stopped-pharmacy consulted for HIT protocol.  Significant studies: 8/6>> Echo: EF> 75% 9/19>>Chest x-ray: No acute abnormality in the lung. 9/19>> CT head: No acute intracranial abnormality 9/19>> CT C-spine: Postoperative findings of posterior laminectomy/fusion C4-T1, prevertebral soft tissue edema appears reduced, postoperative fluid collection overlies laminectomy site does not appear significantly changed compared to prior examination. 9/20>> MRI C-spine: Attempted but due to AMS-not completed 9/21>> MRI C Spine: Osteomyelitis of skull base, anterior C1 and odontoid, C2-C3 facet joint-associated erosion of bone since 8/27-resolved epidural abscess 9/22>> coarsened/heterogeneous appearance of the liver parenchyma-suspicious for cirrhosis, gallbladder not  visualized. 9/22>> bilateral lower extremity Doppler: No DVT 9/29>> chest x-ray: Bibasilar infiltrates suggesting multifocal pneumonia   Assessment & Plan:   Principal Problem:   Acute metabolic encephalopathy Active Problems:   Hyperlipidemia   Hyperkalemia   HYPERTENSION   Anemia of chronic disease   Type 2 diabetes mellitus with renal complication (HCC)   ESRD (end stage renal disease) (HCC)   Epidural abscess   COVID-19   Hx of bacteremia   Goals of care, counseling/discussion   Palliative care by specialist   DNR (do not resuscitate) discussion   Palliative care encounter   Comfort measures only status   Acute respiratory failure with hypoxia Secondary to below. Patient required intubation and mechanical ventilation and was extubated on 9/26. Currently on room air.  COVID-19 pneumonia HCAP Sputum culture significant for MSSA and E. Coli. He was treated with Cefazolin for bacterial pneumonia in addition to Remdesivir. Remdesivir treatment was discontinued secondary to significant rise in LFTs. Patient has completed quarantine period.  Acute metabolic encephalopathy Likely multifactorial in setting of uremia vs sepsis. Appears to be resolved. Negative CT head.  Osteomyelitis of skull base Neurosurgery consulted with no recommendations for surgical management at this time. Patient was managed on Vancomycin and Rifampin, however this was switched to doxycycline secondary to elevated LFTs. Currently on Doxycycline with recommendations for follow-up at the ID clinic 3-4 weeks after discharge in addition to neurosurgery follow-up. Doxycycline discontinued secondary to [place] change.  Septic shock In setting of pneumonia. Required vasopressor support in the ICU which is now resolved.  Dysphagia -Continue dysphagia 3 diet  Thrombocytopenia Mild. Stable.  Cirhossis Hepatitis C HCV not detected on recent RNA quant test -Outpatient follow-up -Fluid balance per nephrology  with HD  ESRD on HD Nephrology consulted. Patient is refusing HD. Had discussion with patient for which he states he does not like having HD at [facility] secondary to the HD unit being cold. He understands that not having HD will likely lead to his death and instead will wait until he  can discharge to resume HD. Per psychiatry, patient does not have capacity, however this does not address the issue of patient needing to be compliant with HD for safety. Patient continues to decline HD. Watch respiratory status while not receiving HD.  Essential hypertension Discontinue amlodipine. Comfort measures.  Hyperkalemia Secondary to HD non-adherence. Started on [employer] in setting of comfort measures and no HD  Hyperlipidemia Patient is on Lipitor as an outpatient which was held secondary to elevated LFTs this admission.  Agitation Appears to be baseline. Psychiatry consulted and patient does not have capacity to make medical decisions. Seroquel for symptom management.  Diabetes mellitus, type 2 Hemoglobin A1C of 6.5%. Patient is on Levemir 11 units BID as an outpatient. Discontinue SSI  Pressure injury Right/left buttock, not present on admission   DVT prophylaxis: SCDs Code Status:   Code Status: DNR Family Communication: None at bedside. Disposition Plan: Discharge to hospice vs hospital death.    Consultants:   PCCM  Nephrology  Neurosurgery  Palliative care medicine  Procedures:     Antimicrobials:      Subjective: No issues today. No dyspnea.  Objective: Vitals:   03/19/20 0700 03/20/20 0614 03/20/20 0725 03/20/20 1039  BP:  (!) 176/84 (!) 159/86 (!) 144/69  Pulse: 76 (!) 57 88 90  Resp: 18 20 18 15   Temp:  99 F (37.2 C) 98.3 F (36.8 C) 98.3 F (36.8 C)  TempSrc:  Oral Axillary Oral  SpO2: 98% 92% 95% 98%  Weight:      Height:        Intake/Output Summary (Last 24 hours) at 03/21/2020 1251 Last data filed at 03/21/2020 0900 Gross per 24  hour  Intake 260 ml  Output --  Net 260 ml   Filed Weights   03/14/20 1347 03/18/20 0500 03/19/20 0500  Weight: 82.7 kg 81.5 kg 84.4 kg    Examination:  General exam: Appears calm and comfortable Respiratory system: Clear to auscultation. Respiratory effort normal. Cardiovascular system: S1 & S2 heard, RRR. No murmurs, rubs, gallops or clicks. Gastrointestinal system: Abdomen is nondistended, soft and nontender. No organomegaly or masses felt. Normal bowel sounds heard. Central nervous system: Alert and oriented to person and place. Musculoskeletal: No edema. No calf tenderness Skin: No cyanosis. No rashes Psychiatry: Judgement and insight appear impaired. Slightly agitated today which is better than other days   Data Reviewed: I have personally reviewed following labs and imaging studies  CBC Lab Results  Component Value Date   WBC 4.8 03/20/2020   RBC 3.06 (Rodney) 03/20/2020   HGB 8.1 (Rodney) 03/20/2020   HCT 25.1 (Rodney) 03/20/2020   MCV 82.0 03/20/2020   MCH 26.5 03/20/2020   PLT 221 03/20/2020   MCHC 32.3 03/20/2020   RDW 18.5 (H) 03/20/2020   LYMPHSABS 1.0 02/24/2020   MONOABS 0.2 02/24/2020   EOSABS 0.0 02/24/2020   BASOSABS 0.0 18/29/9371     Last metabolic panel Lab Results  Component Value Date   NA 137 03/20/2020   K 5.9 (H) 03/20/2020   CL 108 03/20/2020   CO2 18 (Rodney) 03/20/2020   BUN 72 (H) 03/20/2020   CREATININE 12.81 (H) 03/20/2020   GLUCOSE 117 (H) 03/20/2020   GFRNONAA 4 (Rodney) 03/20/2020   GFRAA 9 (Rodney) 03/05/2020   CALCIUM 8.6 (Rodney) 03/20/2020   PHOS 3.7 03/18/2020   PROT 6.1 (Rodney) 03/05/2020   ALBUMIN 2.1 (Rodney) 03/18/2020   BILITOT 1.3 (H) 03/05/2020   ALKPHOS 93 03/05/2020   AST 30  03/05/2020   ALT 9 03/05/2020   ANIONGAP 11 03/20/2020    CBG (last 3)  Recent Labs    03/20/20 0700 03/20/20 0846 03/20/20 1141  GLUCAP 66* 112* 103*     GFR: Estimated Creatinine Clearance: 6.1 mL/min (A) (by C-G formula based on SCr of 12.81 mg/dL  (H)).  Coagulation Profile: No results for input(s): INR, PROTIME in the last 168 hours.  No results found for this or any previous visit (from the past 240 hour(s)).      Radiology Studies: No results found.      Scheduled Meds: . amLODipine  10 mg Oral Daily  . chlorhexidine  15 mL Mouth Rinse BID  . Chlorhexidine Gluconate Cloth  6 each Topical Q0600  . collagenase   Topical Daily  . mouth rinse  15 mL Mouth Rinse q12n4p  . pantoprazole  40 mg Oral Daily  . QUEtiapine  50 mg Oral BID  . sodium zirconium cyclosilicate  10 g Oral Daily   Continuous Infusions:   LOS: 30 days     Cordelia Poche, MD [facility] 03/21/2020, 12:51 PM  If 7PM-7AM, please contact night-coverage [email or web address]

## 2020-03-22 DIAGNOSIS — G9341 Metabolic encephalopathy: Secondary | ICD-10-CM | POA: Diagnosis not present

## 2020-03-22 NOTE — Progress Notes (Signed)
Palliative Medicine RN Note: Symptom check.  Patient is asleep and snoring. Safety sitter at bedside. I elected not to wake him and risk agitation. Sitter reports he has been sleeping a lot today and has not reported any pain. Respirations are unlabored.   Will follow up tomorrow.   Marjie Skiff Taejon Irani, RN, BSN, [facility] Palliative Medicine Team 03/22/2020 1:24 PM Office [phone]

## 2020-03-22 NOTE — Progress Notes (Signed)
PROGRESS NOTE    [name].  DDU:[id number] DOB: 1951/01/31 DOA: 02/20/2020 PCP: [facility]    Brief Narrative:  69 year old gentleman with history of ESRD on hemodialysis, type 2 diabetes, MRSA bacteremia with epidural abscess status post multiple hospitalizations and treatments lately admitted a month ago with [id number] pneumonia complicated by encephalopathy, hypotension requiring intubation and ICU transfer. Hospital course complicated with multiple significant events as below 8/3-8/23, patient was hospitalized for epidural abscess/MRSA bacteremia.  Underwent laminectomy briefly intubated discharged to SNF on antibiotics 9/18, Covid positive at a SNF treated with antibody infusion, admitted to hospital on 9/19 with fever and confusion.  He was obtunded and hypotensive after coming back from dialysis when he was emergently intubated and transferred to ICU. 9/30, extubated and transferred back to [facility]. Remains persistently encephalopathic, not willing to have hemodialysis and family agreeable to start comfort care measures.  Currently waiting for inpatient hospice bed.   Assessment & Plan:   Principal Problem:   Acute metabolic encephalopathy Active Problems:   Hyperlipidemia   Hyperkalemia   HYPERTENSION   Anemia of chronic disease   Type 2 diabetes mellitus with renal complication (HCC)   ESRD (end stage renal disease) (HCC)   Epidural abscess   COVID-19   Hx of bacteremia   Goals of care, counseling/discussion   Palliative care by specialist   DNR (do not resuscitate) discussion   Palliative care encounter   Comfort measures only status  Acute respiratory failure with hypoxia: resolved COVID-19 pneumonia, HCAP, osteomyelitis of the skull base and septic shock: resolved Acute metabolic encephalopathy, multifactorial with uremia versus sepsis. Adult failure to thrive, dysphagia, hepatitis C and cirrhosis: Refusal to treatment and  dialysis. End-of-life care  Plan: After overall poor clinical status and patient's desire not to continue dialysis.  Difficult to treat medical condition despite maximal medical therapy a comfort care and hospice approach was suggested and started. All comfort care medications available. DNR/DNI RN to pronounce death if happens in the hospital Waiting for inpatient bed at hospice home to provide end-of-life care.   DVT prophylaxis: Place and maintain sequential compression device Start: 03/07/20 1142   Code Status: Comfort care Family Communication: None at bedside Disposition Plan: Status is: Inpatient  Remains inpatient appropriate because:Unsafe d/c plan   Dispo: The patient is from: SNF              Anticipated d/c is to: Hospice home              Anticipated d/c date is: 1 day              Patient currently is medically stable to d/c.  Only to transfer to hospice level of care.         Consultants:   Psychiatry  Nephrology  Procedures:   None  Antimicrobials:   None   Subjective: Patient seen and examined.  Pleasantly confused.  No overnight events.  Bedside nursing reported no issues for controlling symptoms and keeping him comfortable.  Objective: Vitals:   03/20/20 1039 03/21/20 1329 03/22/20 0500 03/22/20 0524  BP:   (!) 185/75 (!) 185/75  Pulse: 90  89 89  Resp: 15 19 18 18   Temp: 98.3 F (36.8 C) 98.5 F (36.9 C) 98 F (36.7 C)   TempSrc: Oral Axillary Axillary   SpO2: 98%     Weight:      Height:        Intake/Output Summary (Last 24 hours) at 03/22/2020 1345  Last data filed at 03/22/2020 1054 Gross per 24 hour  Intake 680 ml  Output 5 ml  Net 675 ml   Filed Weights   03/14/20 1347 03/18/20 0500 03/19/20 0500  Weight: 82.7 kg 81.5 kg 84.4 kg    Examination:  General exam: Appears calm, comfortable and not in any distress. Pleasantly confused.  He is alert and oriented to person and place but not time. Chronically sick  looking. Intermittently agitated. Respiratory system: Clear to auscultation. Respiratory effort normal. Permacath on the right chest. Cardiovascular system: S1 & S2 heard, RRR.  Gastrointestinal system: Soft nontender. Psychiatry: Judgement and insight appear impaired.  Mood & affect flat, intermittently agitated.    Data Reviewed: I have personally reviewed following labs and imaging studies  CBC: Recent Labs  Lab 03/18/20 0148 03/20/20 1015  WBC 5.0 4.8  HGB 8.4* 8.1*  HCT 25.4* 25.1*  MCV 83.8 82.0  PLT 187 916   Basic Metabolic Panel: Recent Labs  Lab 03/18/20 0148 03/19/20 1034 03/20/20 1015  NA 136 132* 137  K 5.1 6.4* 5.9*  CL 108 105 108  CO2 19* 19* 18*  GLUCOSE 79 129* 117*  BUN 59* 68* 72*  CREATININE 11.13* 12.18* 12.81*  CALCIUM 8.5* 8.6* 8.6*  PHOS 3.7  --   --    GFR: Estimated Creatinine Clearance: 6.1 mL/min (A) (by C-G formula based on SCr of 12.81 mg/dL (H)). Liver Function Tests: Recent Labs  Lab 03/18/20 0148  ALBUMIN 2.1*   No results for input(s): LIPASE, AMYLASE in the last 168 hours. No results for input(s): AMMONIA in the last 168 hours. Coagulation Profile: No results for input(s): INR, PROTIME in the last 168 hours. Cardiac Enzymes: No results for input(s): CKTOTAL, CKMB, CKMBINDEX, TROPONINI in the last 168 hours. BNP (last 3 results) No results for input(s): PROBNP in the last 8760 hours. HbA1C: No results for input(s): HGBA1C in the last 72 hours. CBG: Recent Labs  Lab 03/19/20 2146 03/19/20 2343 03/20/20 0700 03/20/20 0846 03/20/20 1141  GLUCAP 69* 95 66* 112* 103*   Lipid Profile: No results for input(s): CHOL, HDL, LDLCALC, TRIG, CHOLHDL, LDLDIRECT in the last 72 hours. Thyroid Function Tests: No results for input(s): TSH, T4TOTAL, FREET4, T3FREE, THYROIDAB in the last 72 hours. Anemia Panel: No results for input(s): VITAMINB12, FOLATE, FERRITIN, TIBC, IRON, RETICCTPCT in the last 72 hours. Sepsis Labs: No  results for input(s): PROCALCITON, LATICACIDVEN in the last 168 hours.  No results found for this or any previous visit (from the past 240 hour(s)).       Radiology Studies: No results found.      Scheduled Meds: . chlorhexidine  15 mL Mouth Rinse BID  . Chlorhexidine Gluconate Cloth  6 each Topical Q0600  . collagenase   Topical Daily  . mouth rinse  15 mL Mouth Rinse q12n4p  . pantoprazole  40 mg Oral Daily  . QUEtiapine  50 mg Oral BID  . sodium zirconium cyclosilicate  10 g Oral Daily   Continuous Infusions:   LOS: 31 days    Time spent: 25 minutes    Barb Merino, MD [facility] Pager [phone]

## 2020-03-22 NOTE — Plan of Care (Signed)

## 2020-03-22 NOTE — Progress Notes (Signed)
[facility]) Hospital Liaison Note:  Update on this [place] Referral: Unfortunately [facility] is not able to offer a room today.   An Barrister's clerk will follow up with CSW and family tomorrow or sooner if room becomes available.  Please do not hesitate to call with questions.  Thank you.   Gar Ponto, RN [facility] Liaison  [place] are on AMION

## 2020-03-23 ENCOUNTER — Inpatient Hospital Stay: Payer: No Typology Code available for payment source | Admitting: Infectious Diseases

## 2020-03-23 DIAGNOSIS — G9341 Metabolic encephalopathy: Secondary | ICD-10-CM | POA: Diagnosis not present

## 2020-03-23 MED ORDER — MORPHINE SULFATE (CONCENTRATE) 10 MG/0.5ML PO SOLN: 10.0000 mg | ORAL | Status: DC | PRN

## 2020-03-23 MED ORDER — HALOPERIDOL LACTATE 5 MG/ML IJ SOLN
2.0000 mg | Freq: Four times a day (QID) | INTRAMUSCULAR | Status: DC | PRN
  Administered 2020-03-23: 2 mg via INTRAMUSCULAR

## 2020-03-23 MED ORDER — HALOPERIDOL 0.5 MG PO TABS: 0.5000 mg | ORAL_TABLET | ORAL | Status: DC | PRN

## 2020-03-23 MED ORDER — HALOPERIDOL LACTATE 2 MG/ML PO CONC: 0.5000 mg | ORAL | Status: DC | PRN

## 2020-03-23 NOTE — Progress Notes (Signed)
Palliative Medicine RN Note: Checked in with RN Tu to ensure pt remains comfortable. Pt has Ativan and Haldol available prn. No further needs at this time. RN instructed to call our office if the meds are inadequate.  Marjie Skiff Rushi Chasen, RN, BSN, [facility] Palliative Medicine Team 03/23/2020 1:01 PM Office [phone]

## 2020-03-23 NOTE — Progress Notes (Signed)
[facility]) Hospital Liaison Note:  Update on this [place] Referral: Unfortunately [facility] is not able to offer a room today.   An Barrister's clerk will follow up with CSW and family tomorrow or sooner if room becomes available.  Please do not hesitate to call with questions.  Thank you,  Farrel Gordon, RN, [facility]       [place] (listed on AMION under [place])     [phone]

## 2020-03-23 NOTE — Plan of Care (Signed)
  Problem: Education: Goal: Knowledge of General Education information will improve Description: Including pain rating scale, medication(s)/side effects and non-pharmacologic comfort measures 03/23/2020 0831 by Mayme Genta, RN Outcome: Progressing 03/23/2020 0830 by Mayme Genta, RN Outcome: Progressing   Problem: Clinical Measurements: Goal: Ability to maintain clinical measurements within normal limits will improve 03/23/2020 0831 by Mayme Genta, RN Outcome: Progressing 03/23/2020 0830 by Mayme Genta, RN Outcome: Progressing Goal: Will remain free from infection 03/23/2020 0831 by Mayme Genta, RN Outcome: Progressing 03/23/2020 0830 by Mayme Genta, RN Outcome: Progressing Goal: Diagnostic test results will improve 03/23/2020 0831 by Mayme Genta, RN Outcome: Progressing 03/23/2020 0830 by Mayme Genta, RN Outcome: Progressing Goal: Respiratory complications will improve 03/23/2020 0831 by Mayme Genta, RN Outcome: Progressing 03/23/2020 0830 by Mayme Genta, RN Outcome: Progressing Goal: Cardiovascular complication will be avoided 03/23/2020 0831 by Mayme Genta, RN Outcome: Progressing 03/23/2020 0830 by Mayme Genta, RN Outcome: Progressing

## 2020-03-23 NOTE — Progress Notes (Signed)
PROGRESS NOTE    [name].  WJX:[id number] DOB: Nov 18, 1950 DOA: 02/20/2020 PCP: [facility]    Brief Narrative:  69 year old gentleman with history of ESRD on hemodialysis, type 2 diabetes, MRSA bacteremia with epidural abscess status post multiple hospitalizations and treatments lately admitted a month ago with [id number] pneumonia complicated by encephalopathy, hypotension requiring intubation and ICU transfer. Hospital course complicated with multiple significant events as below 8/3-8/23, patient was hospitalized for epidural abscess/MRSA bacteremia.  Underwent laminectomy briefly intubated discharged to SNF on antibiotics 9/18, Covid positive at a SNF treated with antibody infusion, admitted to hospital on 9/19 with fever and confusion.  He was obtunded and hypotensive after coming back from dialysis when he was emergently intubated and transferred to ICU. 9/30, extubated and transferred back to [facility]. Remains persistently encephalopathic, not willing to have hemodialysis and family agreeable to start comfort care measures.  Currently waiting for inpatient hospice bed.   Assessment & Plan:   Principal Problem:   Acute metabolic encephalopathy Active Problems:   Hyperlipidemia   Hyperkalemia   HYPERTENSION   Anemia of chronic disease   Type 2 diabetes mellitus with renal complication (HCC)   ESRD (end stage renal disease) (HCC)   Epidural abscess   COVID-19   Hx of bacteremia   Goals of care, counseling/discussion   Palliative care by specialist   DNR (do not resuscitate) discussion   Palliative care encounter   Comfort measures only status  Acute respiratory failure with hypoxia: resolved COVID-19 pneumonia, HCAP, osteomyelitis of the skull base and septic shock: resolved Acute metabolic encephalopathy, multifactorial with uremia versus sepsis. Adult failure to thrive, dysphagia, hepatitis C and cirrhosis: Refusal to treatment and  dialysis. End-of-life care  Plan: After overall poor clinical status and patient's desire not to continue dialysis.  Difficult to treat medical condition despite maximal medical therapy a comfort care and hospice approach was suggested and started. All comfort care medications available. DNR/DNI RN to pronounce death if happens in the hospital Waiting for inpatient bed at hospice home to provide end-of-life care. Patient with behavior problems, he is spitting a lot of medications, agitated overnight.  Will try intramuscular Haldol and concentrated morphine.   DVT prophylaxis: Place and maintain sequential compression device Start: 03/07/20 1142   Code Status: Comfort care Family Communication: None at bedside Disposition Plan: Status is: Inpatient  Remains inpatient appropriate because:Unsafe d/c plan   Dispo: The patient is from: SNF              Anticipated d/c is to: Hospice home              Anticipated d/c date is: 1 day              Patient currently is medically stable to d/c.  Only to transfer to hospice level of care.         Consultants:   Psychiatry  Nephrology  Procedures:   None  Antimicrobials:   None   Subjective: Patient seen and examined.  On my evaluation he was sleeping.  Nursing reported he was agitated trying to get out of the bed and is spitting any medicine that he was given.  He tried to hit the nursing staff.  Objective: Vitals:   03/21/20 1329 03/22/20 0500 03/22/20 0524 03/23/20 0538  BP:  (!) 185/75 (!) 185/75 (!) 153/96  Pulse:  89 89 95  Resp: 19 18 18 15   Temp: 98.5 F (36.9 C) 98 F (36.7 C)  98.3 F (36.8 C)  TempSrc: Axillary Axillary  Oral  SpO2:    (!) 89%  Weight:      Height:        Intake/Output Summary (Last 24 hours) at 03/23/2020 1332 Last data filed at 03/23/2020 0100 Gross per 24 hour  Intake 30 ml  Output --  Net 30 ml   Filed Weights   03/14/20 1347 03/18/20 0500 03/19/20 0500  Weight: 82.7 kg  81.5 kg 84.4 kg    Examination:  General exam: Appears calm, comfortable and not in any distress. Pleasantly confused.  He is alert and oriented to person and place but not time. Chronically sick looking. Intermittently agitated. Respiratory system: Clear to auscultation. Respiratory effort normal. Permacath on the right chest. Cardiovascular system: S1 & S2 heard, RRR.  Gastrointestinal system: Soft nontender. Psychiatry: Judgement and insight appear impaired.  Mood & affect flat, intermittently agitated.    Data Reviewed: I have personally reviewed following labs and imaging studies  CBC: Recent Labs  Lab 03/18/20 0148 03/20/20 1015  WBC 5.0 4.8  HGB 8.4* 8.1*  HCT 25.4* 25.1*  MCV 83.8 82.0  PLT 187 644   Basic Metabolic Panel: Recent Labs  Lab 03/18/20 0148 03/19/20 1034 03/20/20 1015  NA 136 132* 137  K 5.1 6.4* 5.9*  CL 108 105 108  CO2 19* 19* 18*  GLUCOSE 79 129* 117*  BUN 59* 68* 72*  CREATININE 11.13* 12.18* 12.81*  CALCIUM 8.5* 8.6* 8.6*  PHOS 3.7  --   --    GFR: Estimated Creatinine Clearance: 6.1 mL/min (A) (by C-G formula based on SCr of 12.81 mg/dL (H)). Liver Function Tests: Recent Labs  Lab 03/18/20 0148  ALBUMIN 2.1*   No results for input(s): LIPASE, AMYLASE in the last 168 hours. No results for input(s): AMMONIA in the last 168 hours. Coagulation Profile: No results for input(s): INR, PROTIME in the last 168 hours. Cardiac Enzymes: No results for input(s): CKTOTAL, CKMB, CKMBINDEX, TROPONINI in the last 168 hours. BNP (last 3 results) No results for input(s): PROBNP in the last 8760 hours. HbA1C: No results for input(s): HGBA1C in the last 72 hours. CBG: Recent Labs  Lab 03/19/20 2146 03/19/20 2343 03/20/20 0700 03/20/20 0846 03/20/20 1141  GLUCAP 69* 95 66* 112* 103*   Lipid Profile: No results for input(s): CHOL, HDL, LDLCALC, TRIG, CHOLHDL, LDLDIRECT in the last 72 hours. Thyroid Function Tests: No results for  input(s): TSH, T4TOTAL, FREET4, T3FREE, THYROIDAB in the last 72 hours. Anemia Panel: No results for input(s): VITAMINB12, FOLATE, FERRITIN, TIBC, IRON, RETICCTPCT in the last 72 hours. Sepsis Labs: No results for input(s): PROCALCITON, LATICACIDVEN in the last 168 hours.  No results found for this or any previous visit (from the past 240 hour(s)).       Radiology Studies: No results found.      Scheduled Meds: . chlorhexidine  15 mL Mouth Rinse BID  . Chlorhexidine Gluconate Cloth  6 each Topical Q0600  . collagenase   Topical Daily  . mouth rinse  15 mL Mouth Rinse q12n4p  . pantoprazole  40 mg Oral Daily  . QUEtiapine  50 mg Oral BID  . sodium zirconium cyclosilicate  10 g Oral Daily   Continuous Infusions:   LOS: 32 days    Time spent: 25 minutes    Barb Merino, MD [facility] Pager [phone]

## 2020-03-23 NOTE — TOC Progression Note (Signed)
Transition of Care (TOC) - Progression Note    Patient Details  Name: [name]. MRN: [id number] Date of Birth: July 26, 1950  Transition of Care [facility]) CM/SW [place], RN Phone Number: 03/23/2020, 3:35 PM  Clinical Narrative:    Case management spoke with Christlyn, RNCM with [facility] and patient may be receiving a bed offer at [facility] in the next 1-2 days for admission.  Will continue to follow the patient for possible transfer.   Expected Discharge Plan: [place] Barriers to Discharge: Hospice Bed not available  Expected Discharge Plan and Services Expected Discharge Plan: [place] In-house Referral: Clinical Social Work Discharge Planning Services: CM Consult Post Acute Care Choice: Hospice Living arrangements for the past 2 months: Apartment, [place]                                       Social Determinants of Health (SDOH) Interventions    Readmission Risk Interventions Readmission Risk Prevention Plan 02/22/2020 01/18/2020  Transportation Screening Complete Complete  Medication Review Press photographer) Referral to Pharmacy Complete  PCP or Specialist appointment within 3-5 days of discharge Complete Complete  HRI or [place] Complete Complete  SW Recovery Care/Counseling Consult Complete Complete  Palliative Care Screening Not Applicable Complete  [place] Patient Refused Complete  Some recent data might be hidden

## 2020-03-23 NOTE — Plan of Care (Signed)

## 2020-03-23 NOTE — Progress Notes (Addendum)
Patient  is being abusive to the staff, more especially to  the assigned sitter Blanch Media NT, patient is noncompliant and has been refusing basically everything we offer him .  he cursed her out multiple times, charge nurse informed unit ADN already , will let the NT/Sitter do safety zone before shift end. Sitter still in the with patient and the daughter is still in patient room.

## 2020-03-24 DIAGNOSIS — G9341 Metabolic encephalopathy: Secondary | ICD-10-CM | POA: Diagnosis not present

## 2020-03-24 MED ORDER — QUETIAPINE FUMARATE 50 MG PO TABS
50.0000 mg | ORAL_TABLET | Freq: Two times a day (BID) | ORAL | Status: AC
Start: 2020-03-24 — End: ?

## 2020-03-24 NOTE — Progress Notes (Signed)
Safety sitter 1:1 remains at the bedside.  The patient will shout out loudly and talking to others that are not there.  Remains in low bed, call light within reach

## 2020-03-24 NOTE — TOC Transition Note (Signed)
Transition of Care [facility]) - CM/SW Discharge Note   Patient Details  Name: [name]. MRN: [id number] Date of Birth: Aug 06, 1950  [facility]) CM/SW Contact:  Curlene Labrum, RN Phone Number: 03/24/2020, 10:59 AM   Clinical Narrative:    Case management spoke with Donne Hazel with [facility] and patient has an available bed with [facility].  Dr. Sloan Leiter was notified of the patient's available bed at the facility today and will place a discharge summary and orders for the patient to be discharged today.  The family is being contacted by [facility] so that they can sign him in to the facility. The patient will be transported to [facility] once the [facility] notifies me that they are ready for the patient for admission.   Final next level of care: [place] Barriers to Discharge: Hospice Bed not available   Patient Goals and CMS Choice Patient states their goals for this hospitalization and ongoing recovery are:: Patient's family requesting Inpatient Hospice placement at [facility] - no beds available at this time. CMS Medicare.gov Compare Post Acute Care list provided to:: Patient Represenative (must comment) (daughter - Joseph Art) Choice offered to / list presented to : Adult Children  Discharge Placement                       Discharge Plan and Services In-house Referral: Clinical Social Work Discharge Planning Services: CM Consult Post Acute Care Choice: Hospice                               Social Determinants of Health (SDOH) Interventions     Readmission Risk Interventions Readmission Risk Prevention Plan 02/22/2020 01/18/2020  Transportation Screening Complete Complete  Medication Review (RN Care Manager) Referral to Pharmacy Complete  PCP or Specialist appointment within 3-5 days of discharge Complete Complete  HRI or [place] Complete Complete  SW Recovery  Care/Counseling Consult Complete Complete  Palliative Care Screening Not Applicable Complete  [place] Patient Refused Complete  Some recent data might be hidden

## 2020-03-24 NOTE — Progress Notes (Signed)
Manufacturing engineer [facility]) Hospital Liaison note.     This patient can transfer to [facility] today.   [facility] will notify TOC when registration paperwork has been completed to arrange transport.    RN please call report to [phone].   Thank you,     Farrel Gordon, RN, CCM       [place] (listed on [place] under Hospice/[facility])     [phone]

## 2020-03-24 NOTE — Discharge Summary (Signed)
Physician Discharge Summary  [name]. TDS:[id number] DOB: [birth year] DOA: 02/20/2020  PCP: [facility]  Admit date: 02/20/2020 Discharge date: 03/24/2020  Admitted From: Nursing home Disposition: Inpatient hospice  Recommendations for Outpatient Follow-up:  As per hospice plan  Discharge Condition: Serious CODE STATUS: Comfort care Diet recommendation: Comfort diet  Discharge summary: 69 year old gentleman with history of ESRD on hemodialysis, type 2 diabetes, MRSA bacteremia with epidural abscess status post multiple hospitalizations and treatments lately admitted a month ago with [id number] pneumonia complicated by encephalopathy, hypotension requiring intubation and ICU transfer. Hospital course complicated with multiple significant events as below  8/3-8/23, patient was hospitalized for epidural abscess/MRSA bacteremia.  Underwent laminectomy briefly intubated discharged to SNF on antibiotics  9/18, Covid positive at a SNF treated with antibody infusion, admitted to hospital on 9/19 with fever and confusion.  He was obtunded and hypotensive after coming back from dialysis when he was emergently intubated and transferred to ICU.  9/30, extubated and transferred back to [facility]. Remains persistently encephalopathic, not willing to have hemodialysis and family agreeable to start comfort care measures.    Due to overall poor clinical status and patient's desire not to continue dialysis.  Difficult to treat medical condition despite maximal medical therapy, a comfort care and hospice approach was suggested and patient started on comfort care measures. Patient is at his end-of-life, transferred to inpatient hospice to provide end-of-life care. All-time fall precautions, delirium precautions. Medicate for comfort and pain before transfer.  Discharge Diagnoses:  Principal Problem:   Acute metabolic encephalopathy Active Problems:   Hyperlipidemia    Hyperkalemia   HYPERTENSION   Anemia of chronic disease   Type 2 diabetes mellitus with renal complication (HCC)   ESRD (end stage renal disease) (HCC)   Epidural abscess   COVID-19   Hx of bacteremia   Goals of care, counseling/discussion   Palliative care by specialist   DNR (do not resuscitate) discussion   Palliative care encounter   Comfort measures only status    Discharge Instructions  Discharge Instructions    Diet general   Complete by: As directed    Discharge wound care:   Complete by: As directed    Apply Santyl to buttocks/sacrum wound Q day, then cover with moist gauze and dry ABD pad and tape   Increase activity slowly   Complete by: As directed      Allergies as of 03/24/2020   No Active Allergies     Medication List    STOP taking these medications   amLODipine 10 MG tablet Commonly known as: NORVASC   atorvastatin 20 MG tablet Commonly known as: LIPITOR   calcitRIOL 0.25 MCG capsule Commonly known as: ROCALTROL   clonazePAM 0.5 MG tablet Commonly known as: KLONOPIN   Darbepoetin Alfa 100 MCG/0.5ML Sosy injection Commonly known as: ARANESP   gabapentin 100 MG capsule Commonly known as: NEURONTIN   HYDROcodone-acetaminophen 5-325 MG tablet Commonly known as: NORCO/VICODIN   insulin detemir 100 UNIT/ML injection Commonly known as: LEVEMIR   metoprolol tartrate 25 MG tablet Commonly known as: LOPRESSOR   multivitamin Tabs tablet   multivitamin with minerals Tabs tablet   polyethylene glycol 17 g packet Commonly known as: MIRALAX / GLYCOLAX   rifampin 300 MG capsule Commonly known as: RIFADIN   sevelamer carbonate 800 MG tablet Commonly known as: RENVELA     TAKE these medications   QUEtiapine 50 MG tablet Commonly known as: SEROQUEL Take 1 tablet (50 mg total) by mouth 2 (  two) times daily. What changed:   medication strength  how much to take  when to take this            Discharge Care Instructions  (From  admission, onward)         Start     Ordered   03/24/20 0000  Discharge wound care:       Comments: Apply Santyl to buttocks/sacrum wound Q day, then cover with moist gauze and dry ABD pad and tape   03/24/20 1112          Follow-up Information    [facility] [place] [facility]. Call.   Why: Follow up to arrange transportation to Dialysis appointments after you are out of COVID isolation window. Contact information: [phone]             No Active Allergies  Consultations:  Nephrology  Hospice and palliative     Procedures/Studies: CT HEAD WO CONTRAST  Result Date: 03/09/2020 CLINICAL DATA:  Status post fall. EXAM: CT HEAD WITHOUT CONTRAST CT CERVICAL SPINE WITHOUT CONTRAST TECHNIQUE: Multidetector CT imaging of the head and cervical spine was performed following the standard protocol without intravenous contrast. Multiplanar CT image reconstructions of the cervical spine were also generated. COMPARISON:  February 20, 2020. FINDINGS: CT HEAD FINDINGS Brain: Mild chronic ischemic white matter disease. No mass effect or midline shift is noted. Ventricular size is within normal limits. There is no evidence of mass lesion, hemorrhage or acute infarction. Vascular: No hyperdense vessel or unexpected calcification. Skull: Normal. Negative for fracture or focal lesion. Sinuses/Orbits: No acute finding. Other: None. CT CERVICAL SPINE FINDINGS Alignment: Normal. Skull base and vertebrae: Stable erosive changes are seen involving the dens and anterior arch of C1 most consistent with degenerative change. Status post laminectomy extending from C4 to T1. Status post surgical posterior fusion extending from C4 to T1 with bilateral intrapedicular screw placement at all levels except C7. Soft tissues and spinal canal: No prevertebral fluid or swelling. No visible canal hematoma. Disc levels: Severe degenerative disc disease is noted at C6-7 and C7-T1. Upper chest: Some degree of pleural  effusion is seen involving the right lung apex. Other: None. IMPRESSION: 1. Mild chronic ischemic white matter disease. No acute intracranial abnormality seen. 2. Postsurgical and degenerative changes as described above. No acute abnormality seen in the cervical spine. Electronically Signed   By: Marijo Conception M.D.   On: 03/09/2020 13:14   CT CERVICAL SPINE WO CONTRAST  Result Date: 03/09/2020 CLINICAL DATA:  Status post fall. EXAM: CT HEAD WITHOUT CONTRAST CT CERVICAL SPINE WITHOUT CONTRAST TECHNIQUE: Multidetector CT imaging of the head and cervical spine was performed following the standard protocol without intravenous contrast. Multiplanar CT image reconstructions of the cervical spine were also generated. COMPARISON:  February 20, 2020. FINDINGS: CT HEAD FINDINGS Brain: Mild chronic ischemic white matter disease. No mass effect or midline shift is noted. Ventricular size is within normal limits. There is no evidence of mass lesion, hemorrhage or acute infarction. Vascular: No hyperdense vessel or unexpected calcification. Skull: Normal. Negative for fracture or focal lesion. Sinuses/Orbits: No acute finding. Other: None. CT CERVICAL SPINE FINDINGS Alignment: Normal. Skull base and vertebrae: Stable erosive changes are seen involving the dens and anterior arch of C1 most consistent with degenerative change. Status post laminectomy extending from C4 to T1. Status post surgical posterior fusion extending from C4 to T1 with bilateral intrapedicular screw placement at all levels except C7. Soft tissues and spinal canal: No prevertebral fluid  or swelling. No visible canal hematoma. Disc levels: Severe degenerative disc disease is noted at C6-7 and C7-T1. Upper chest: Some degree of pleural effusion is seen involving the right lung apex. Other: None. IMPRESSION: 1. Mild chronic ischemic white matter disease. No acute intracranial abnormality seen. 2. Postsurgical and degenerative changes as described above. No  acute abnormality seen in the cervical spine. Electronically Signed   By: Marijo Conception M.D.   On: 03/09/2020 13:14   DG CHEST PORT 1 VIEW  Result Date: 03/01/2020 CLINICAL DATA:  Respiratory distress.  COVID. EXAM: PORTABLE CHEST 1 VIEW COMPARISON:  02/24/2020 FINDINGS: The endotracheal tube is been removed in the enteric tube converted for a feeding tube. Enteric tube tip is off the field of view but below the left hemidiaphragm. Right central venous catheter is unchanged in position. No pneumothorax. Cardiac enlargement. Bilateral basilar infiltrates suggesting multifocal pneumonia. IMPRESSION: Bilateral basilar infiltrates suggesting multifocal pneumonia. Electronically Signed   By: Lucienne Capers M.D.   On: 03/01/2020 05:02   DG CHEST PORT 1 VIEW  Result Date: 02/24/2020 CLINICAL DATA:  COVID-19 positive 02/20/2020, respiratory failure, intubated EXAM: PORTABLE CHEST 1 VIEW COMPARISON:  02/23/2020 FINDINGS: Single frontal view of the chest demonstrates endotracheal tube well above carina. Enteric catheter passes below diaphragm tip excluded by collimation. Bilateral internal jugular catheters are unchanged. Cardiac silhouette is stable. Persistent patchy consolidation at the lung bases, unchanged. No effusion or pneumothorax. No acute bony abnormalities. IMPRESSION: 1. Stable support devices. 2. Persistent bibasilar patchy consolidation which could reflect atelectasis or pneumonia. Electronically Signed   By: Randa Ngo M.D.   On: 02/24/2020 18:07   DG CHEST PORT 1 VIEW  Result Date: 02/23/2020 CLINICAL DATA:  Encounter for central line placement EXAM: PORTABLE CHEST 1 VIEW COMPARISON:  02/21/2020 FINDINGS: Endotracheal tube approximately 2 cm above the carina. NG tube enters the stomach with the tip not visualized. Right jugular central venous catheter tip in the right atrium unchanged. New left jugular catheter with the tip in the proximal SVC. No pneumothorax. Mild right lower lobe  atelectasis. Negative for edema or effusion IMPRESSION: Left jugular central venous catheter tip in the SVC. No pneumothorax. Mild right lower lobe atelectasis. Endotracheal tube 2 cm above the carina. Electronically Signed   By: Franchot Gallo M.D.   On: 02/23/2020 15:14   DG Chest Port 1V same Day  Result Date: 03/02/2020 CLINICAL DATA:  Shortness of breath.  Altered mental status. EXAM: PORTABLE CHEST 1 VIEW COMPARISON:  Radiograph yesterday. FINDINGS: Enteric tube remains in place with tip below the diaphragm. Right internal jugular dialysis catheter unchanged. Improving cardiomegaly with stable mediastinal contours. Persistent but improving patchy bibasilar opacities. No pneumothorax. No large pleural effusion. Surgical hardware in the lower cervical spine is partially included. IMPRESSION: 1. Persistent but improving patchy bibasilar opacities, atelectasis versus pneumonia. 2. Improving cardiomegaly. Electronically Signed   By: Keith Rake M.D.   On: 03/02/2020 15:17   DG Swallowing Func-Speech Pathology  Result Date: 03/03/2020 Objective Swallowing Evaluation: Type of Study: MBS-Modified Barium Swallow Study  Patient Details Name: [name]. MRN: [id number] Date of Birth: 18-Feb-1951 Today's Date: 03/03/2020 Time: SLP Start Time (ACUTE ONLY): 6222 -SLP Stop Time (ACUTE ONLY): 1418 SLP Time Calculation (min) (ACUTE ONLY): 24 min Past Medical History: Past Medical History: Diagnosis Date . Anemia  . Arthritis   left knee . Chronic kidney disease   acute renal failure/injury requiring short-term HD 2013 . Colon cancer ([place])  . Colon polyp  11/2011 - Polyps identified, biopsy - invasive adenocarinoma . DM II (diabetes mellitus, type II), controlled ([place])   type 2 IDDM x 15 years. A1C 1/09 13.7.  Marland Kitchen 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 12/25/2011 . GERD (gastroesophageal reflux disease)  . Heart murmur  . Hx of ileostomy 10/13/2012 . Hyperlipidemia  . Hypertension   for a few  years and resolved in 2013/2014. . Sleep apnea   does not wear CPAP now . Wears glasses  Past Surgical History: Past Surgical History: Procedure Laterality Date . APPLICATION OF WOUND VAC  9/76/7341  Procedure: APPLICATION OF WOUND VAC;  Surgeon: Zenovia Jarred, MD;  Location: [place];  Service: General;  Laterality: N/A; . APPLICATION OF WOUND VAC  9/37/9024  Procedure: APPLICATION OF WOUND VAC;  Surgeon: Imogene Burn. [name], MD;  Location: [place];  Service: General;  Laterality: N/A; . AV FISTULA PLACEMENT Left 07/29/2019  Procedure: ARTERIOVENOUS (AV) FISTULA CREATION;  Surgeon: Marty Heck, MD;  Location: [place];  Service: Vascular;  Laterality: Left; . COLONOSCOPY   . COLONOSCOPY  02/06/2012  Procedure: COLONOSCOPY;  Surgeon: Milus Banister, MD;  Location: [place];  Service: Endoscopy;; . COLONOSCOPY WITH PROPOFOL N/A 06/10/2019  Procedure: COLONOSCOPY WITH PROPOFOL;  Surgeon: Milus Banister, MD;  Location: [facility];  Service: Endoscopy;  Laterality: N/A; . COLOSTOMY REVERSAL   . DRESSING CHANGE UNDER ANESTHESIA  02/18/2012  Procedure: DRESSING CHANGE UNDER ANESTHESIA;  Surgeon: Odis Hollingshead, MD;  Location: [place];  Service: General;  Laterality: N/A; . ESOPHAGOGASTRODUODENOSCOPY (EGD) WITH PROPOFOL N/A 06/10/2019  Procedure: ESOPHAGOGASTRODUODENOSCOPY (EGD) WITH PROPOFOL;  Surgeon: Milus Banister, MD;  Location: [facility];  Service: Endoscopy;  Laterality: N/A; . GANGLION CYST EXCISION  20 YRS AGO  RT ARM . ILEOSTOMY  02/16/2012  Procedure: ILEOSTOMY;  Surgeon: Zenovia Jarred, MD;  Location: [place];  Service: General;  Laterality: N/A; . ILEOSTOMY CLOSURE N/A 09/02/2012  Procedure: ILEOSTOMY REVERSAL;  Surgeon: Imogene Burn. [name], MD;  Location: [place];  Service: General;  Laterality: N/A; . ILIOSTOMY   . INCISION AND DRAINAGE OF WOUND  02/23/2012  Procedure: IRRIGATION AND DEBRIDEMENT WOUND;  Surgeon: Joyice Faster. Cornett, MD;  Location: [place];  Service: General;  Laterality: N/A; . IR DIALY SHUNT INTRO  [place] W/IMG LEFT Left 01/21/2020 . IR FLUORO GUIDE CV LINE RIGHT  01/21/2020 . IR REMOVAL TUN CV CATH W/O FL  01/07/2020 . IR US GUIDE VASC ACCESS LEFT  01/21/2020 . IR US GUIDE VASC ACCESS RIGHT  01/21/2020 . LAPAROTOMY  02/16/2012  Procedure: EXPLORATORY LAPAROTOMY;  Surgeon: Zenovia Jarred, MD;  Location: [place];  Service: General;  Laterality: N/A;  Exploratory Laparotomy with resection of anastomosis . LAPAROTOMY  02/18/2012  Procedure: EXPLORATORY LAPAROTOMY;  Surgeon: Odis Hollingshead, MD;  Location: [place];  Service: General;  Laterality: N/A;  exploratory laparotomy  change of abdominal vac dressing . LAPAROTOMY  02/20/2012  Procedure: EXPLORATORY LAPAROTOMY;  Surgeon: Odis Hollingshead, MD;  Location: [place];  Service: General;  Laterality: N/A; . LAPAROTOMY  02/23/2012  Procedure: EXPLORATORY LAPAROTOMY;  Surgeon: Joyice Faster. Cornett, MD;  Location: [place];  Service: General;  Laterality: N/A;  Irrigation and Debridement of abdominal wound with wound vac change with partial closure . LAPAROTOMY  02/26/2012  Procedure: EXPLORATORY LAPAROTOMY;  Surgeon: Imogene Burn. [name], MD;  Location: [place];  Service: General;  Laterality: N/A;   . PARTIAL COLECTOMY  02/06/2012 . PARTIAL COLECTOMY  02/06/2012  Procedure: PARTIAL COLECTOMY;  Surgeon: Imogene Burn. [name], MD;  Location: [place];  Service: General;  Laterality: N/A;  right partial colectomy . POSTERIOR CERVICAL FUSION/FORAMINOTOMY N/A 01/06/2020  Procedure: CERVICAL FOUR- THORACIC TWO LAMINECTOMY AND FUSION;  Surgeon: Consuella Lose, MD;  Location: [place];  Service: Neurosurgery;  Laterality: N/A;  CERVICAL FOUR- THORACIC TWO LAMINECTOMY AND FUSION . RESECTION SMALL BOWEL / CLOSURE ILEOSTOMY  402/2014  Dr [name] . VACUUM ASSISTED CLOSURE CHANGE  02/20/2012  Procedure: ABDOMINAL VACUUM ASSISTED CLOSURE CHANGE;  Surgeon: Odis Hollingshead, MD;  Location: [place];  Service: General;; . VACUUM ASSISTED CLOSURE CHANGE  02/23/2012  Procedure: ABDOMINAL VACUUM ASSISTED  CLOSURE CHANGE;  Surgeon: Joyice Faster. Cornett, MD;  Location: [place];  Service: General;  Laterality: N/A; HPI: [name]. is a 69 y.o. male with medical history significant of GERD, hypertension, hyperlipidemia ESRD on hemodialysis, type 2 diabetes mellitus, anemia of chronic disease, colon cancer status post resection, epidural abscess status post laminectomy, posterior cervical fusion on 01/06/2020 and MRSA bacteremia and presented emergently for evaluation of confusion and COVID-19 positive. Initial BSE 02/21/20 was limited- needed enouragement, audible swallow, eructation, poor dentition and reduced endurance, Work of breathing was stable. Dys 2/thin recommended. On 9/22 became unresponsive after HD, transferred to ICU and intubated then extubated 9/25. Currently has NGT, encephalopathy improving per MD note and speech to re-eval. CXR Bilateral basilar infiltrates suggesting multifocal pneumonia.  No data recorded Assessment / Plan / Recommendation CHL IP CLINICAL IMPRESSIONS 03/03/2020 Clinical Impression Pt demonstrates mild orpharyngeal dysphagia mostly related to anatomical differences. 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 In addition, initiation of swallow is intermittently late with boluses reaching the pyriform sinuses for up to 5 seconds prior to swallow. His epiglottis at times does not fully invert adding to inadequate protection. Thin barium was penetrated before and during the swallow remaining superior to the vocal cords. His reflexive throat clear, present most of the time did not clear vestibule and a chin tuck was not effective. Penetration was present once with nectar at end of study. Nectar thick recommended for pt to initiate diet and upgrade to thin from clinical observation with SLP. Dys 2 texture recommended, crush pills, small sips/bites, intermittent throat clear and  avoid straws.    SLP Visit Diagnosis Dysphagia, pharyngeal phase (R13.13) Attention and concentration deficit following -- Frontal lobe and executive function deficit following -- Impact on safety and function Moderate aspiration risk   CHL IP TREATMENT RECOMMENDATION 03/03/2020 Treatment Recommendations Therapy as outlined in treatment plan below   Prognosis 03/03/2020 Prognosis for Safe Diet Advancement Good Barriers to Reach Goals -- Barriers/Prognosis Comment -- CHL IP DIET RECOMMENDATION 03/03/2020 SLP Diet Recommendations Dysphagia 2 (Fine chop) solids;Nectar thick liquid Liquid Administration via Cup Medication Administration Crushed with puree Compensations Slow rate;Small sips/bites;Clear throat intermittently Postural Changes Seated upright at 90 degrees   CHL IP OTHER RECOMMENDATIONS 03/03/2020 Recommended Consults -- Oral Care Recommendations Oral care BID Other Recommendations Order thickener from pharmacy   CHL IP FOLLOW UP RECOMMENDATIONS 03/03/2020 Follow up Recommendations (No Data)   CHL IP FREQUENCY AND DURATION 03/03/2020 Speech Therapy Frequency (ACUTE ONLY) min 2x/week Treatment Duration 2 weeks      CHL IP ORAL PHASE 03/03/2020 Oral Phase Impaired Oral - Pudding Teaspoon -- Oral - Pudding Cup -- Oral - Honey Teaspoon -- Oral - Honey Cup -- Oral - Nectar Teaspoon -- Oral - Nectar Cup [facility] Oral -  Nectar Straw -- Oral - Thin Teaspoon -- Oral - Thin Cup Decreased bolus cohesion Oral - Thin Straw -- Oral - Puree -- Oral - Mech Soft -- Oral - Regular Delayed oral transit Oral - Multi-Consistency -- Oral - Pill -- Oral Phase - Comment --  CHL IP PHARYNGEAL PHASE 03/03/2020 Pharyngeal Phase Impaired Pharyngeal- Pudding Teaspoon -- Pharyngeal -- Pharyngeal- Pudding Cup -- Pharyngeal -- Pharyngeal- Honey Teaspoon -- Pharyngeal -- Pharyngeal- Honey Cup -- Pharyngeal -- Pharyngeal- Nectar Teaspoon -- Pharyngeal -- Pharyngeal- Nectar Cup Delayed swallow initiation-pyriform sinuses;Reduced epiglottic  inversion;Reduced airway/laryngeal closure;Penetration/Aspiration during swallow;Pharyngeal residue - valleculae Pharyngeal Material enters airway, remains ABOVE vocal cords and not ejected out Pharyngeal- Nectar Straw -- Pharyngeal -- Pharyngeal- Thin Teaspoon -- Pharyngeal -- Pharyngeal- Thin Cup Delayed swallow initiation-pyriform sinuses;Pharyngeal residue - valleculae;Pharyngeal residue - pyriform;Penetration/Aspiration before swallow;Reduced airway/laryngeal closure Pharyngeal Material enters airway, remains ABOVE vocal cords and not ejected out Pharyngeal- Thin Straw -- Pharyngeal -- Pharyngeal- Puree -- Pharyngeal -- Pharyngeal- Mechanical Soft -- Pharyngeal -- Pharyngeal- Regular WFL Pharyngeal -- Pharyngeal- Multi-consistency -- Pharyngeal -- Pharyngeal- Pill -- Pharyngeal -- Pharyngeal Comment --  CHL IP CERVICAL ESOPHAGEAL PHASE 03/03/2020 Cervical Esophageal Phase WFL Pudding Teaspoon -- Pudding Cup -- Honey Teaspoon -- Honey Cup -- Nectar Teaspoon -- Nectar Cup -- Nectar Straw -- Thin Teaspoon -- Thin Cup -- Thin Straw -- Puree -- Mechanical Soft -- Regular -- Multi-consistency -- Pill -- Cervical Esophageal Comment -- [place] Siren 03/03/2020, 4:35 PM Orbie Pyo Litaker M.Ed Actor Pager [phone] Office [phone]              VAS [place] LOWER EXTREMITY VENOUS (DVT)  Result Date: 02/23/2020  Lower Venous DVTStudy Indications: Elevated D dimer. Other Indications: + Covid. Risk Factors: None identified. Performing Technologist: Griffin Basil RCT RDMS  Examination Guidelines: A complete evaluation includes B-mode imaging, spectral Doppler, color Doppler, and power Doppler as needed of all accessible portions of each vessel. Bilateral testing is considered an integral part of a complete examination. Limited examinations for reoccurring indications may be performed as noted. The reflux portion of the exam is performed with the patient in reverse Trendelenburg.   +---------+---------------+---------+-----------+----------+--------------+ RIGHT    CompressibilityPhasicitySpontaneityPropertiesThrombus Aging +---------+---------------+---------+-----------+----------+--------------+ CFV      Full           Yes      Yes                                 +---------+---------------+---------+-----------+----------+--------------+ SFJ      Full                                                        +---------+---------------+---------+-----------+----------+--------------+ FV Prox  Full                                                        +---------+---------------+---------+-----------+----------+--------------+ FV Mid   Full                                                        +---------+---------------+---------+-----------+----------+--------------+  FV DistalFull                                                        +---------+---------------+---------+-----------+----------+--------------+ PFV      Full                                                        +---------+---------------+---------+-----------+----------+--------------+ POP      Full           Yes      Yes                                 +---------+---------------+---------+-----------+----------+--------------+ PTV      Full                                                        +---------+---------------+---------+-----------+----------+--------------+ PERO     Full                                                        +---------+---------------+---------+-----------+----------+--------------+   +---------+---------------+---------+-----------+----------+--------------+ LEFT     CompressibilityPhasicitySpontaneityPropertiesThrombus Aging +---------+---------------+---------+-----------+----------+--------------+ CFV      Full           Yes      Yes                                  +---------+---------------+---------+-----------+----------+--------------+ SFJ      Full                                                        +---------+---------------+---------+-----------+----------+--------------+ FV Prox  Full                                                        +---------+---------------+---------+-----------+----------+--------------+ FV Mid   Full                                                        +---------+---------------+---------+-----------+----------+--------------+ FV DistalFull                                                        +---------+---------------+---------+-----------+----------+--------------+  PFV      Full                                                        +---------+---------------+---------+-----------+----------+--------------+ POP      Full           Yes      Yes                                 +---------+---------------+---------+-----------+----------+--------------+ PTV      Full                                                        +---------+---------------+---------+-----------+----------+--------------+ PERO     Full                                                        +---------+---------------+---------+-----------+----------+--------------+     Summary: RIGHT: - There is no evidence of deep vein thrombosis in the lower extremity.  - No cystic structure found in the popliteal fossa.  LEFT: - There is no evidence of deep vein thrombosis in the lower extremity.  - No cystic structure found in the popliteal fossa.  *See table(s) above for measurements and observations. Electronically signed by Harold Barban MD on 02/23/2020 at 9:31:43 PM.    Final    US Abdomen Limited RUQ  Result Date: 02/23/2020 CLINICAL DATA:  Transaminitis EXAM: ULTRASOUND ABDOMEN LIMITED RIGHT UPPER QUADRANT COMPARISON:  None. FINDINGS: Gallbladder: The gallbladder was not visualized and may be surgically absent  Common bile duct: Diameter: 3 mm Liver: The liver parenchyma is coarsened and heterogeneous. The liver surface appears somewhat nodular there is no discrete hepatic mass. There is suggestion of bidirectional flow within the portal vein Other: The right kidney appears atrophic and echogenic. There are multiple right-sided renal cysts measuring up to approximately 3.6 cm. IMPRESSION: 1. Gallbladder not visualized. 2. Coarsened and heterogeneous appearance of the liver parenchyma with findings suspicious for underlying cirrhosis. 3. Borderline bidirectional flow within the main portal vein is suggestive of portal hypertension. 4. Echogenic and atrophic appearing right kidney consistent with the patient's history of renal failure. Electronically Signed   By: Constance Holster M.D.   On: 02/23/2020 19:21    (Echo, Carotid, EGD, Colonoscopy, ERCP)    Subjective: Patient seen and examined.  Overnight he remained agitated, spitting on medications.  When he is active he ate half of meal. No other overnight events.  Is pleasantly confused.    Discharge Exam: Vitals:   03/22/20 0524 03/23/20 0538  BP: (!) 185/75 (!) 153/96  Pulse: 89 95  Resp: 18 15  Temp:  98.3 F (36.8 C)  SpO2:  (!) 89%   Vitals:   03/21/20 1329 03/22/20 0500 03/22/20 0524 03/23/20 0538  BP:  (!) 185/75 (!) 185/75 (!) 153/96  Pulse:  89 89 95  Resp: 19 18 18 15   Temp:  98 F (36.7 C)  98.3 F (36.8 C)  TempSrc: Axillary Axillary  Oral  SpO2:    (!) 89%  Weight:      Height:        General: Pt is alert,, chronically sick looking.  Not in any distress.  Pleasantly confused and alert oriented x1. Intermittently agitated towards the staff. Cardiovascular: RRR, S1/S2 +, no rubs, no gallops Permacath right chest. Respiratory: CTA bilaterally, no wheezing, no rhonchi Abdominal: Soft, NT, ND, bowel sounds + Extremities: no edema, no cyanosis, chronic venous stasis changes.    The results of significant diagnostics from  this hospitalization (including imaging, microbiology, ancillary and laboratory) are listed below for reference.     Microbiology: No results found for this or any previous visit (from the past 240 hour(s)).   Labs: BNP (last 3 results) No results for input(s): BNP in the last 8760 hours. Basic Metabolic Panel: Recent Labs  Lab 03/18/20 0148 03/19/20 1034 03/20/20 1015  NA 136 132* 137  K 5.1 6.4* 5.9*  CL 108 105 108  CO2 19* 19* 18*  GLUCOSE 79 129* 117*  BUN 59* 68* 72*  CREATININE 11.13* 12.18* 12.81*  CALCIUM 8.5* 8.6* 8.6*  PHOS 3.7  --   --    Liver Function Tests: Recent Labs  Lab 03/18/20 0148  ALBUMIN 2.1*   No results for input(s): LIPASE, AMYLASE in the last 168 hours. No results for input(s): AMMONIA in the last 168 hours. CBC: Recent Labs  Lab 03/18/20 0148 03/20/20 1015  WBC 5.0 4.8  HGB 8.4* 8.1*  HCT 25.4* 25.1*  MCV 83.8 82.0  PLT 187 221   Cardiac Enzymes: No results for input(s): CKTOTAL, CKMB, CKMBINDEX, TROPONINI in the last 168 hours. BNP: Invalid input(s): POCBNP CBG: Recent Labs  Lab 03/19/20 2146 03/19/20 2343 03/20/20 0700 03/20/20 0846 03/20/20 1141  GLUCAP 69* 95 66* 112* 103*   D-Dimer No results for input(s): DDIMER in the last 72 hours. Hgb A1c No results for input(s): HGBA1C in the last 72 hours. Lipid Profile No results for input(s): CHOL, HDL, LDLCALC, TRIG, CHOLHDL, LDLDIRECT in the last 72 hours. Thyroid function studies No results for input(s): TSH, T4TOTAL, T3FREE, THYROIDAB in the last 72 hours.  Invalid input(s): FREET3 Anemia work up No results for input(s): VITAMINB12, FOLATE, FERRITIN, TIBC, IRON, RETICCTPCT in the last 72 hours. Urinalysis    Component Value Date/Time   COLORURINE YELLOW 01/04/2020 2120   APPEARANCEUR CLEAR 01/04/2020 2120   LABSPEC 1.012 01/04/2020 2120   PHURINE 5.0 01/04/2020 2120   GLUCOSEU 50 (A) 01/04/2020 2120   GLUCOSEU NEG mg/dL 12/05/2009 1042   HGBUR SMALL (A)  01/04/2020 2120   BILIRUBINUR NEGATIVE 01/04/2020 2120   KETONESUR NEGATIVE 01/04/2020 2120   PROTEINUR >=300 (A) 01/04/2020 2120   UROBILINOGEN 0.2 09/15/2013 1059   NITRITE NEGATIVE 01/04/2020 2120   LEUKOCYTESUR NEGATIVE 01/04/2020 2120   Sepsis Labs Invalid input(s): PROCALCITONIN,  WBC,  LACTICIDVEN Microbiology No results found for this or any previous visit (from the past 240 hour(s)).   Time coordinating discharge:  35 minutes  SIGNED:   Barb Merino, MD  [facility] 03/24/2020, 11:13 AM

## 2020-03-24 NOTE — Progress Notes (Signed)
Patient refused medications

## 2020-03-24 NOTE — Progress Notes (Signed)
Discharge packet printed and will be sent to [facility] via [place].  Report was called to [place] at [facility]

## 2020-03-24 NOTE — TOC Transition Note (Signed)
Transition of Care [facility]) - CM/SW Discharge Note   Patient Details  Name: [name]. MRN: [id number] Date of Birth: 02/11/1951  [facility] CM/SW Contact:  Curlene Labrum, RN Phone Number: 03/24/2020, 1:22 PM   Clinical Narrative:    Case management spoke with [name], RNCM with [facility] of the phone and [facility] is ready to admit the patient.  I called and spoke with the patient's daughter, [name] and notified her that the patient would be able to transfer to the facility by ambulance and one family member was welcome to ride with the patient but she declined and mentioned that the family will meet the patient at [facility] instead.  [facility] was called and transportation was arranged to the facility - discharge summary, DNR and transport notes are present in the transfer packet.  Rodney Griffins, RN is aware and is calling report to [facility].   Final next level of care: [place] Barriers to Discharge: Hospice Bed not available   Patient Goals and CMS Choice Patient states their goals for this hospitalization and ongoing recovery are:: Patient's family requesting Inpatient Hospice placement at [facility] - no beds available at this time. CMS Medicare.gov Compare Post Acute Care list provided to:: Patient Represenative (must comment) (daughter - [name]) Choice offered to / list presented to : Adult Children  Discharge Placement                       Discharge Plan and Services In-house Referral: Clinical Social Work Discharge Planning Services: CM Consult Post Acute Care Choice: Hospice                               Social Determinants of Health (SDOH) Interventions     Readmission Risk Interventions Readmission Risk Prevention Plan 02/22/2020 01/18/2020  Transportation Screening Complete Complete  Medication Review (RN Care Manager) Referral to Pharmacy Complete  PCP or  Specialist appointment within 3-5 days of discharge Complete Complete  HRI or [place] Complete Complete  SW Recovery Care/Counseling Consult Complete Complete  Palliative Care Screening Not Applicable Complete  [place] Patient Refused Complete  Some recent data might be hidden

## 2020-03-24 NOTE — Plan of Care (Signed)

## 2020-03-24 NOTE — Plan of Care (Signed)
  Problem: Education: Goal: Knowledge of General Education information will improve Description: Including pain rating scale, medication(s)/side effects and non-pharmacologic comfort measures Outcome: Progressing   Problem: Health Behavior/Discharge Planning: Goal: Ability to manage health-related needs will improve Outcome: Progressing   Problem: Clinical Measurements: Goal: Ability to maintain clinical measurements within normal limits will improve Outcome: Progressing Goal: Will remain free from infection Outcome: Progressing Goal: Diagnostic test results will improve Outcome: Progressing Goal: Respiratory complications will improve Outcome: Progressing Goal: Cardiovascular complication will be avoided Outcome: Progressing   Problem: Activity: Goal: Risk for activity intolerance will decrease Outcome: Progressing   Problem: Nutrition: Goal: Adequate nutrition will be maintained Outcome: Progressing   Problem: Coping: Goal: Level of anxiety will decrease Outcome: Progressing   Problem: Elimination: Goal: Will not experience complications related to bowel motility Outcome: Progressing Goal: Will not experience complications related to urinary retention Outcome: Progressing   Problem: Pain Managment: Goal: General experience of comfort will improve Outcome: Progressing   Problem: Safety: Goal: Ability to remain free from injury will improve Outcome: Progressing   Problem: Skin Integrity: Goal: Risk for impaired skin integrity will decrease Outcome: Progressing   Problem: Education: Goal: Knowledge of risk factors and measures for prevention of condition will improve Outcome: Progressing   Problem: Coping: Goal: Psychosocial and spiritual needs will be supported Outcome: Progressing   Problem: Respiratory: Goal: Will maintain a patent airway Outcome: Progressing Goal: Complications related to the disease process, condition or treatment will be avoided or  minimized Outcome: Progressing   Problem: Education: Goal: Knowledge of disease and its progression will improve Outcome: Progressing Goal: Individualized Educational Video(s) Outcome: Progressing   Problem: Fluid Volume: Goal: Compliance with measures to maintain balanced fluid volume will improve Outcome: Progressing   Problem: Health Behavior/Discharge Planning: Goal: Ability to manage health-related needs will improve Outcome: Progressing   Problem: Nutritional: Goal: Ability to make healthy dietary choices will improve Outcome: Progressing   Problem: Clinical Measurements: Goal: Complications related to the disease process, condition or treatment will be avoided or minimized Outcome: Progressing

## 2020-03-24 NOTE — Progress Notes (Signed)
PTAR here- discharged to [facility].

## 2020-04-03 DEATH — deceased
